# Patient Record
Sex: Male | Born: 1960 | Race: White | Hispanic: No | Marital: Single | Smoking: Never smoker
Health system: Southern US, Community
[De-identification: ages and names within clinical notes are randomized; demographics above are authoritative.]

## PROBLEM LIST (undated history)

## (undated) DIAGNOSIS — G809 Cerebral palsy, unspecified: Secondary | ICD-10-CM

## (undated) DIAGNOSIS — Z8719 Personal history of other diseases of the digestive system: Secondary | ICD-10-CM

## (undated) DIAGNOSIS — A419 Sepsis, unspecified organism: Secondary | ICD-10-CM

## (undated) DIAGNOSIS — N390 Urinary tract infection, site not specified: Secondary | ICD-10-CM

## (undated) DIAGNOSIS — M869 Osteomyelitis, unspecified: Secondary | ICD-10-CM

## (undated) DIAGNOSIS — K219 Gastro-esophageal reflux disease without esophagitis: Secondary | ICD-10-CM

## (undated) DIAGNOSIS — L219 Seborrheic dermatitis, unspecified: Secondary | ICD-10-CM

## (undated) DIAGNOSIS — Z8711 Personal history of peptic ulcer disease: Secondary | ICD-10-CM

## (undated) DIAGNOSIS — D649 Anemia, unspecified: Secondary | ICD-10-CM

## (undated) DIAGNOSIS — L97509 Non-pressure chronic ulcer of other part of unspecified foot with unspecified severity: Secondary | ICD-10-CM

## (undated) DIAGNOSIS — F419 Anxiety disorder, unspecified: Secondary | ICD-10-CM

## (undated) DIAGNOSIS — K254 Chronic or unspecified gastric ulcer with hemorrhage: Secondary | ICD-10-CM

## (undated) DIAGNOSIS — K270 Acute peptic ulcer, site unspecified, with hemorrhage: Secondary | ICD-10-CM

## (undated) DIAGNOSIS — F319 Bipolar disorder, unspecified: Secondary | ICD-10-CM

## (undated) DIAGNOSIS — K5730 Diverticulosis of large intestine without perforation or abscess without bleeding: Secondary | ICD-10-CM

## (undated) DIAGNOSIS — L89159 Pressure ulcer of sacral region, unspecified stage: Secondary | ICD-10-CM

## (undated) DIAGNOSIS — F429 Obsessive-compulsive disorder, unspecified: Secondary | ICD-10-CM

## (undated) DIAGNOSIS — F6381 Intermittent explosive disorder: Secondary | ICD-10-CM

## (undated) DIAGNOSIS — E11621 Type 2 diabetes mellitus with foot ulcer: Secondary | ICD-10-CM

## (undated) DIAGNOSIS — R6520 Severe sepsis without septic shock: Secondary | ICD-10-CM

## (undated) DIAGNOSIS — J189 Pneumonia, unspecified organism: Secondary | ICD-10-CM

## (undated) DIAGNOSIS — R531 Weakness: Secondary | ICD-10-CM

## (undated) DIAGNOSIS — Z9109 Other allergy status, other than to drugs and biological substances: Secondary | ICD-10-CM

## (undated) DIAGNOSIS — F79 Unspecified intellectual disabilities: Secondary | ICD-10-CM

## (undated) DIAGNOSIS — G8250 Quadriplegia, unspecified: Secondary | ICD-10-CM

## (undated) DIAGNOSIS — F29 Unspecified psychosis not due to a substance or known physiological condition: Secondary | ICD-10-CM

## (undated) DIAGNOSIS — K648 Other hemorrhoids: Secondary | ICD-10-CM

## (undated) DIAGNOSIS — Z8619 Personal history of other infectious and parasitic diseases: Secondary | ICD-10-CM

## (undated) DIAGNOSIS — F32A Depression, unspecified: Secondary | ICD-10-CM

## (undated) DIAGNOSIS — K5900 Constipation, unspecified: Secondary | ICD-10-CM

## (undated) DIAGNOSIS — E559 Vitamin D deficiency, unspecified: Secondary | ICD-10-CM

## (undated) DIAGNOSIS — J42 Unspecified chronic bronchitis: Secondary | ICD-10-CM

## (undated) DIAGNOSIS — F78 Other intellectual disabilities: Secondary | ICD-10-CM

## (undated) DIAGNOSIS — Z9289 Personal history of other medical treatment: Secondary | ICD-10-CM

## (undated) DIAGNOSIS — F329 Major depressive disorder, single episode, unspecified: Secondary | ICD-10-CM

## (undated) DIAGNOSIS — E7800 Pure hypercholesterolemia, unspecified: Secondary | ICD-10-CM

## (undated) DIAGNOSIS — R32 Unspecified urinary incontinence: Secondary | ICD-10-CM

## (undated) HISTORY — DX: Acute peptic ulcer, site unspecified, with hemorrhage: K27.0

## (undated) HISTORY — DX: Cerebral palsy, unspecified: G80.9

## (undated) HISTORY — PX: LEG SURGERY: SHX1003

## (undated) HISTORY — PX: OTHER SURGICAL HISTORY: SHX169

## (undated) HISTORY — PX: HERNIA REPAIR: SHX51

## (undated) HISTORY — DX: Unspecified urinary incontinence: R32

---
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
## 1980-05-27 HISTORY — PX: HIATAL HERNIA REPAIR: SHX195

## 1997-12-30 ENCOUNTER — Encounter: Admission: RE | Admit: 1997-12-30 | Discharge: 1997-12-30 | Payer: Self-pay | Admitting: Family Medicine

## 1998-03-28 ENCOUNTER — Encounter: Admission: RE | Admit: 1998-03-28 | Discharge: 1998-03-28 | Payer: Self-pay | Admitting: Family Medicine

## 1999-02-02 ENCOUNTER — Encounter: Admission: RE | Admit: 1999-02-02 | Discharge: 1999-02-02 | Payer: Self-pay | Admitting: Family Medicine

## 1999-05-01 ENCOUNTER — Encounter: Admission: RE | Admit: 1999-05-01 | Discharge: 1999-07-30 | Payer: Self-pay | Admitting: *Deleted

## 1999-06-21 ENCOUNTER — Encounter: Admission: RE | Admit: 1999-06-21 | Discharge: 1999-06-21 | Payer: Self-pay | Admitting: Family Medicine

## 2000-05-12 ENCOUNTER — Encounter: Admission: RE | Admit: 2000-05-12 | Discharge: 2000-05-12 | Payer: Self-pay | Admitting: Family Medicine

## 2000-05-15 ENCOUNTER — Encounter: Admission: RE | Admit: 2000-05-15 | Discharge: 2000-05-15 | Payer: Self-pay | Admitting: Family Medicine

## 2000-11-14 ENCOUNTER — Emergency Department (HOSPITAL_COMMUNITY): Admission: EM | Admit: 2000-11-14 | Discharge: 2000-11-14 | Payer: Self-pay | Admitting: Emergency Medicine

## 2001-06-25 ENCOUNTER — Encounter: Admission: RE | Admit: 2001-06-25 | Discharge: 2001-06-25 | Payer: Self-pay | Admitting: Sports Medicine

## 2001-07-28 ENCOUNTER — Encounter: Admission: RE | Admit: 2001-07-28 | Discharge: 2001-07-28 | Payer: Self-pay | Admitting: Sports Medicine

## 2001-12-16 ENCOUNTER — Encounter: Admission: RE | Admit: 2001-12-16 | Discharge: 2001-12-16 | Payer: Self-pay | Admitting: Family Medicine

## 2001-12-18 ENCOUNTER — Encounter: Admission: RE | Admit: 2001-12-18 | Discharge: 2001-12-18 | Payer: Self-pay | Admitting: Family Medicine

## 2002-01-12 ENCOUNTER — Encounter: Admission: RE | Admit: 2002-01-12 | Discharge: 2002-01-12 | Payer: Self-pay | Admitting: Family Medicine

## 2002-01-12 ENCOUNTER — Encounter: Admission: RE | Admit: 2002-01-12 | Discharge: 2002-01-12 | Payer: Self-pay | Admitting: Sports Medicine

## 2002-01-12 ENCOUNTER — Encounter: Payer: Self-pay | Admitting: Sports Medicine

## 2002-08-24 ENCOUNTER — Encounter: Admission: RE | Admit: 2002-08-24 | Discharge: 2002-08-24 | Payer: Self-pay | Admitting: Sports Medicine

## 2002-10-19 ENCOUNTER — Encounter: Admission: RE | Admit: 2002-10-19 | Discharge: 2002-10-19 | Payer: Self-pay | Admitting: Family Medicine

## 2002-10-27 ENCOUNTER — Encounter: Admission: RE | Admit: 2002-10-27 | Discharge: 2002-10-27 | Payer: Self-pay | Admitting: Family Medicine

## 2002-12-13 ENCOUNTER — Encounter: Admission: RE | Admit: 2002-12-13 | Discharge: 2002-12-13 | Payer: Self-pay | Admitting: Family Medicine

## 2003-11-10 ENCOUNTER — Encounter: Admission: RE | Admit: 2003-11-10 | Discharge: 2003-11-10 | Payer: Self-pay | Admitting: Family Medicine

## 2004-08-08 ENCOUNTER — Ambulatory Visit: Payer: Self-pay | Admitting: Family Medicine

## 2005-08-12 ENCOUNTER — Ambulatory Visit: Payer: Self-pay | Admitting: Sports Medicine

## 2006-06-27 ENCOUNTER — Ambulatory Visit: Payer: Self-pay | Admitting: Family Medicine

## 2006-07-24 DIAGNOSIS — K270 Acute peptic ulcer, site unspecified, with hemorrhage: Secondary | ICD-10-CM | POA: Insufficient documentation

## 2006-07-24 DIAGNOSIS — F79 Unspecified intellectual disabilities: Secondary | ICD-10-CM | POA: Insufficient documentation

## 2006-07-24 DIAGNOSIS — G809 Cerebral palsy, unspecified: Secondary | ICD-10-CM

## 2006-07-24 HISTORY — DX: Acute peptic ulcer, site unspecified, with hemorrhage: K27.0

## 2006-07-24 HISTORY — DX: Cerebral palsy, unspecified: G80.9

## 2006-08-08 ENCOUNTER — Encounter: Payer: Self-pay | Admitting: Family Medicine

## 2006-08-08 ENCOUNTER — Ambulatory Visit: Payer: Self-pay | Admitting: Family Medicine

## 2006-08-08 LAB — CONVERTED CEMR LAB
HCT: 40.2 %
Hemoglobin: 13.4 g/dL

## 2006-08-11 ENCOUNTER — Encounter: Payer: Self-pay | Admitting: Family Medicine

## 2006-08-11 LAB — CONVERTED CEMR LAB
ALT: 39 units/L (ref 0–53)
BUN: 15 mg/dL (ref 6–23)
CO2: 21 meq/L (ref 19–32)
Calcium: 9.4 mg/dL (ref 8.4–10.5)
Chloride: 106 meq/L (ref 96–112)
Creatinine, Ser: 0.68 mg/dL (ref 0.40–1.50)
Direct LDL: 140 mg/dL — ABNORMAL HIGH
Glucose, Bld: 119 mg/dL — ABNORMAL HIGH (ref 70–99)
Total Bilirubin: 0.3 mg/dL (ref 0.3–1.2)

## 2007-07-31 ENCOUNTER — Encounter (INDEPENDENT_AMBULATORY_CARE_PROVIDER_SITE_OTHER): Payer: Self-pay | Admitting: *Deleted

## 2007-07-31 ENCOUNTER — Ambulatory Visit: Payer: Self-pay | Admitting: Family Medicine

## 2007-07-31 DIAGNOSIS — L708 Other acne: Secondary | ICD-10-CM | POA: Insufficient documentation

## 2007-08-03 ENCOUNTER — Encounter: Admission: RE | Admit: 2007-08-03 | Discharge: 2007-08-03 | Payer: Self-pay | Admitting: *Deleted

## 2007-08-03 ENCOUNTER — Encounter (INDEPENDENT_AMBULATORY_CARE_PROVIDER_SITE_OTHER): Payer: Self-pay | Admitting: *Deleted

## 2007-08-03 LAB — CONVERTED CEMR LAB
ALT: 29 units/L (ref 0–53)
Basophils Absolute: 0 10*3/uL (ref 0.0–0.1)
CO2: 25 meq/L (ref 19–32)
Calcium: 10.2 mg/dL (ref 8.4–10.5)
Chloride: 105 meq/L (ref 96–112)
Cholesterol: 205 mg/dL — ABNORMAL HIGH (ref 0–200)
Creatinine, Ser: 0.66 mg/dL (ref 0.40–1.50)
Glucose, Bld: 93 mg/dL (ref 70–99)
Hemoglobin: 14 g/dL (ref 13.0–17.0)
Lymphocytes Relative: 33 % (ref 12–46)
Lymphs Abs: 2.3 10*3/uL (ref 0.7–4.0)
Monocytes Absolute: 0.4 10*3/uL (ref 0.1–1.0)
Neutro Abs: 3.9 10*3/uL (ref 1.7–7.7)
RDW: 13.8 % (ref 11.5–15.5)
Sodium: 141 meq/L (ref 135–145)
Total Protein: 7.4 g/dL (ref 6.0–8.3)
WBC: 7 10*3/uL (ref 4.0–10.5)

## 2007-08-04 ENCOUNTER — Encounter (INDEPENDENT_AMBULATORY_CARE_PROVIDER_SITE_OTHER): Payer: Self-pay | Admitting: *Deleted

## 2007-08-11 ENCOUNTER — Ambulatory Visit: Payer: Self-pay | Admitting: Family Medicine

## 2007-08-11 ENCOUNTER — Encounter (INDEPENDENT_AMBULATORY_CARE_PROVIDER_SITE_OTHER): Payer: Self-pay | Admitting: *Deleted

## 2007-08-19 ENCOUNTER — Ambulatory Visit: Payer: Self-pay | Admitting: Family Medicine

## 2007-08-19 ENCOUNTER — Encounter (INDEPENDENT_AMBULATORY_CARE_PROVIDER_SITE_OTHER): Payer: Self-pay | Admitting: *Deleted

## 2007-08-20 LAB — CONVERTED CEMR LAB
Albumin: 4.6 g/dL (ref 3.5–5.2)
BUN: 11 mg/dL (ref 6–23)
Calcium: 9.9 mg/dL (ref 8.4–10.5)
Chloride: 103 meq/L (ref 96–112)
Cholesterol: 206 mg/dL — ABNORMAL HIGH (ref 0–200)
Glucose, Bld: 96 mg/dL (ref 70–99)
HDL: 47 mg/dL (ref >39)
Potassium: 3.9 meq/L (ref 3.5–5.3)
Triglycerides: 190 mg/dL — ABNORMAL HIGH (ref <150)

## 2007-08-26 ENCOUNTER — Ambulatory Visit: Payer: Self-pay | Admitting: Family Medicine

## 2007-08-31 ENCOUNTER — Ambulatory Visit: Payer: Self-pay | Admitting: Family Medicine

## 2007-09-03 ENCOUNTER — Encounter
Admission: RE | Admit: 2007-09-03 | Discharge: 2007-12-02 | Payer: Self-pay | Admitting: Physical Medicine & Rehabilitation

## 2007-09-09 ENCOUNTER — Ambulatory Visit: Payer: Self-pay | Admitting: Family Medicine

## 2007-09-10 ENCOUNTER — Ambulatory Visit: Payer: Self-pay | Admitting: Physical Medicine & Rehabilitation

## 2007-09-22 ENCOUNTER — Telehealth (INDEPENDENT_AMBULATORY_CARE_PROVIDER_SITE_OTHER): Payer: Self-pay | Admitting: *Deleted

## 2007-09-25 ENCOUNTER — Ambulatory Visit: Payer: Self-pay | Admitting: Family Medicine

## 2007-10-02 ENCOUNTER — Ambulatory Visit: Payer: Self-pay | Admitting: Family Medicine

## 2007-10-05 ENCOUNTER — Encounter (HOSPITAL_BASED_OUTPATIENT_CLINIC_OR_DEPARTMENT_OTHER): Admission: RE | Admit: 2007-10-05 | Discharge: 2007-12-31 | Payer: Self-pay | Admitting: Internal Medicine

## 2007-10-13 ENCOUNTER — Ambulatory Visit: Payer: Self-pay | Admitting: Physical Medicine & Rehabilitation

## 2007-10-28 ENCOUNTER — Encounter (INDEPENDENT_AMBULATORY_CARE_PROVIDER_SITE_OTHER): Payer: Self-pay | Admitting: *Deleted

## 2007-10-29 ENCOUNTER — Encounter: Payer: Self-pay | Admitting: Family Medicine

## 2007-11-09 ENCOUNTER — Encounter
Admission: RE | Admit: 2007-11-09 | Discharge: 2008-01-05 | Payer: Self-pay | Admitting: Physical Medicine & Rehabilitation

## 2007-11-10 ENCOUNTER — Ambulatory Visit: Payer: Self-pay | Admitting: Physical Medicine & Rehabilitation

## 2008-01-01 ENCOUNTER — Encounter (HOSPITAL_BASED_OUTPATIENT_CLINIC_OR_DEPARTMENT_OTHER): Admission: RE | Admit: 2008-01-01 | Discharge: 2008-02-24 | Payer: Self-pay | Admitting: Internal Medicine

## 2008-01-05 ENCOUNTER — Ambulatory Visit: Payer: Self-pay | Admitting: Physical Medicine & Rehabilitation

## 2008-02-11 ENCOUNTER — Encounter: Payer: Self-pay | Admitting: Family Medicine

## 2008-02-26 ENCOUNTER — Encounter (HOSPITAL_BASED_OUTPATIENT_CLINIC_OR_DEPARTMENT_OTHER): Admission: RE | Admit: 2008-02-26 | Discharge: 2008-05-26 | Payer: Self-pay | Admitting: Internal Medicine

## 2008-03-02 ENCOUNTER — Encounter: Payer: Self-pay | Admitting: Family Medicine

## 2008-03-28 ENCOUNTER — Encounter
Admission: RE | Admit: 2008-03-28 | Discharge: 2008-04-25 | Payer: Self-pay | Admitting: Physical Medicine & Rehabilitation

## 2008-03-29 ENCOUNTER — Ambulatory Visit: Payer: Self-pay | Admitting: Physical Medicine & Rehabilitation

## 2008-04-18 ENCOUNTER — Ambulatory Visit: Payer: Self-pay | Admitting: Family Medicine

## 2008-04-20 ENCOUNTER — Encounter: Payer: Self-pay | Admitting: Family Medicine

## 2008-04-25 ENCOUNTER — Ambulatory Visit: Payer: Self-pay | Admitting: Physical Medicine & Rehabilitation

## 2008-05-03 ENCOUNTER — Telehealth: Payer: Self-pay | Admitting: Family Medicine

## 2008-05-31 ENCOUNTER — Encounter (HOSPITAL_BASED_OUTPATIENT_CLINIC_OR_DEPARTMENT_OTHER): Admission: RE | Admit: 2008-05-31 | Discharge: 2008-08-29 | Payer: Self-pay | Admitting: Internal Medicine

## 2008-06-06 ENCOUNTER — Telehealth: Payer: Self-pay | Admitting: Family Medicine

## 2008-06-09 ENCOUNTER — Ambulatory Visit: Payer: Self-pay | Admitting: Family Medicine

## 2008-06-09 DIAGNOSIS — L259 Unspecified contact dermatitis, unspecified cause: Secondary | ICD-10-CM

## 2008-07-17 ENCOUNTER — Emergency Department (HOSPITAL_COMMUNITY): Admission: EM | Admit: 2008-07-17 | Discharge: 2008-07-17 | Payer: Self-pay | Admitting: Emergency Medicine

## 2008-07-20 ENCOUNTER — Ambulatory Visit: Payer: Self-pay | Admitting: Family Medicine

## 2008-08-15 ENCOUNTER — Ambulatory Visit: Payer: Self-pay | Admitting: Family Medicine

## 2008-08-15 ENCOUNTER — Encounter: Payer: Self-pay | Admitting: Family Medicine

## 2008-09-08 ENCOUNTER — Encounter (HOSPITAL_BASED_OUTPATIENT_CLINIC_OR_DEPARTMENT_OTHER): Admission: RE | Admit: 2008-09-08 | Discharge: 2008-10-28 | Payer: Self-pay | Admitting: Internal Medicine

## 2008-09-26 ENCOUNTER — Encounter: Payer: Self-pay | Admitting: *Deleted

## 2008-11-02 ENCOUNTER — Ambulatory Visit: Payer: Self-pay | Admitting: Family Medicine

## 2008-11-23 ENCOUNTER — Telehealth: Payer: Self-pay | Admitting: Family Medicine

## 2008-11-24 ENCOUNTER — Ambulatory Visit: Payer: Self-pay | Admitting: Physical Medicine & Rehabilitation

## 2008-11-24 ENCOUNTER — Encounter
Admission: RE | Admit: 2008-11-24 | Discharge: 2008-11-24 | Payer: Self-pay | Admitting: Physical Medicine & Rehabilitation

## 2008-11-24 ENCOUNTER — Ambulatory Visit: Payer: Self-pay | Admitting: Family Medicine

## 2008-11-25 ENCOUNTER — Encounter (INDEPENDENT_AMBULATORY_CARE_PROVIDER_SITE_OTHER): Payer: Self-pay | Admitting: Family Medicine

## 2008-11-30 ENCOUNTER — Ambulatory Visit: Payer: Self-pay | Admitting: Family Medicine

## 2008-11-30 ENCOUNTER — Telehealth: Payer: Self-pay | Admitting: Family Medicine

## 2008-12-19 ENCOUNTER — Encounter (HOSPITAL_BASED_OUTPATIENT_CLINIC_OR_DEPARTMENT_OTHER): Admission: RE | Admit: 2008-12-19 | Discharge: 2009-03-19 | Payer: Self-pay | Admitting: General Surgery

## 2008-12-20 ENCOUNTER — Encounter: Payer: Self-pay | Admitting: Family Medicine

## 2009-02-21 ENCOUNTER — Ambulatory Visit: Payer: Self-pay | Admitting: Physical Medicine & Rehabilitation

## 2009-02-21 ENCOUNTER — Encounter
Admission: RE | Admit: 2009-02-21 | Discharge: 2009-05-18 | Payer: Self-pay | Admitting: Physical Medicine & Rehabilitation

## 2009-03-14 ENCOUNTER — Ambulatory Visit: Payer: Self-pay | Admitting: Family Medicine

## 2009-03-30 ENCOUNTER — Ambulatory Visit: Payer: Self-pay | Admitting: Family Medicine

## 2009-03-30 ENCOUNTER — Encounter: Payer: Self-pay | Admitting: Family Medicine

## 2009-03-30 LAB — CONVERTED CEMR LAB
CO2: 25 meq/L (ref 19–32)
Creatinine, Ser: 0.58 mg/dL (ref 0.40–1.50)
Glucose, Bld: 161 mg/dL — ABNORMAL HIGH (ref 70–99)
Total Bilirubin: 0.3 mg/dL (ref 0.3–1.2)
Total Protein: 6.3 g/dL (ref 6.0–8.3)

## 2009-04-11 ENCOUNTER — Ambulatory Visit: Payer: Self-pay | Admitting: Physical Medicine & Rehabilitation

## 2009-05-03 ENCOUNTER — Encounter (HOSPITAL_BASED_OUTPATIENT_CLINIC_OR_DEPARTMENT_OTHER): Admission: RE | Admit: 2009-05-03 | Discharge: 2009-05-29 | Payer: Self-pay | Admitting: Internal Medicine

## 2009-05-04 ENCOUNTER — Encounter: Payer: Self-pay | Admitting: Family Medicine

## 2009-05-31 ENCOUNTER — Encounter (HOSPITAL_BASED_OUTPATIENT_CLINIC_OR_DEPARTMENT_OTHER): Admission: RE | Admit: 2009-05-31 | Discharge: 2009-08-29 | Payer: Self-pay | Admitting: Internal Medicine

## 2009-06-02 ENCOUNTER — Encounter
Admission: RE | Admit: 2009-06-02 | Discharge: 2009-06-26 | Payer: Self-pay | Admitting: Physical Medicine & Rehabilitation

## 2009-06-26 ENCOUNTER — Ambulatory Visit: Payer: Self-pay | Admitting: Physical Medicine & Rehabilitation

## 2009-07-05 ENCOUNTER — Ambulatory Visit: Payer: Self-pay | Admitting: Family Medicine

## 2009-07-05 DIAGNOSIS — Z593 Problems related to living in residential institution: Secondary | ICD-10-CM

## 2009-07-06 ENCOUNTER — Telehealth: Payer: Self-pay | Admitting: *Deleted

## 2009-07-07 ENCOUNTER — Encounter: Payer: Self-pay | Admitting: *Deleted

## 2009-07-07 ENCOUNTER — Encounter: Payer: Self-pay | Admitting: Family Medicine

## 2009-07-07 ENCOUNTER — Ambulatory Visit: Payer: Self-pay | Admitting: Family Medicine

## 2009-07-07 LAB — CONVERTED CEMR LAB
Albumin: 4.2 g/dL (ref 3.5–5.2)
CO2: 24 meq/L (ref 19–32)
Calcium: 9.5 mg/dL (ref 8.4–10.5)
Cholesterol: 161 mg/dL (ref 0–200)
Glucose, Bld: 96 mg/dL (ref 70–99)
HCT: 41.6 % (ref 39.0–52.0)
MCV: 86.3 fL (ref 78.0–100.0)
RBC: 4.82 M/uL (ref 4.22–5.81)
Sodium: 142 meq/L (ref 135–145)
Total Bilirubin: 0.3 mg/dL (ref 0.3–1.2)
Total Protein: 6.8 g/dL (ref 6.0–8.3)
Triglycerides: 94 mg/dL (ref <150)
VLDL: 19 mg/dL (ref 0–40)
WBC: 4.6 10*3/uL (ref 4.0–10.5)

## 2009-08-29 ENCOUNTER — Encounter
Admission: RE | Admit: 2009-08-29 | Discharge: 2009-11-27 | Payer: Self-pay | Admitting: Physical Medicine & Rehabilitation

## 2009-08-30 ENCOUNTER — Encounter (HOSPITAL_BASED_OUTPATIENT_CLINIC_OR_DEPARTMENT_OTHER): Admission: RE | Admit: 2009-08-30 | Discharge: 2009-11-28 | Payer: Self-pay | Admitting: Internal Medicine

## 2009-09-04 ENCOUNTER — Ambulatory Visit: Payer: Self-pay | Admitting: Physical Medicine & Rehabilitation

## 2009-09-26 ENCOUNTER — Ambulatory Visit: Payer: Self-pay | Admitting: Physical Medicine & Rehabilitation

## 2009-10-02 ENCOUNTER — Ambulatory Visit: Payer: Self-pay | Admitting: Family Medicine

## 2009-11-10 ENCOUNTER — Ambulatory Visit (HOSPITAL_COMMUNITY): Admission: RE | Admit: 2009-11-10 | Discharge: 2009-11-10 | Payer: Self-pay | Admitting: Internal Medicine

## 2009-11-21 ENCOUNTER — Ambulatory Visit: Payer: Self-pay | Admitting: Physical Medicine & Rehabilitation

## 2009-11-30 ENCOUNTER — Encounter (HOSPITAL_BASED_OUTPATIENT_CLINIC_OR_DEPARTMENT_OTHER): Admission: RE | Admit: 2009-11-30 | Discharge: 2010-02-28 | Payer: Self-pay | Admitting: Internal Medicine

## 2009-12-14 ENCOUNTER — Encounter: Payer: Self-pay | Admitting: *Deleted

## 2009-12-25 ENCOUNTER — Encounter
Admission: RE | Admit: 2009-12-25 | Discharge: 2010-03-25 | Payer: Self-pay | Admitting: Physical Medicine & Rehabilitation

## 2009-12-25 ENCOUNTER — Encounter: Payer: Self-pay | Admitting: Family Medicine

## 2009-12-25 ENCOUNTER — Ambulatory Visit: Payer: Self-pay | Admitting: Family Medicine

## 2009-12-28 ENCOUNTER — Ambulatory Visit: Payer: Self-pay | Admitting: Physical Medicine & Rehabilitation

## 2010-01-30 ENCOUNTER — Ambulatory Visit: Payer: Self-pay | Admitting: Physical Medicine & Rehabilitation

## 2010-02-09 ENCOUNTER — Ambulatory Visit: Payer: Self-pay | Admitting: Family Medicine

## 2010-02-09 DIAGNOSIS — R32 Unspecified urinary incontinence: Secondary | ICD-10-CM

## 2010-02-09 DIAGNOSIS — R300 Dysuria: Secondary | ICD-10-CM | POA: Insufficient documentation

## 2010-02-09 HISTORY — DX: Unspecified urinary incontinence: R32

## 2010-02-09 LAB — CONVERTED CEMR LAB
Bilirubin Urine: NEGATIVE
Blood in Urine, dipstick: NEGATIVE
Glucose, Urine, Semiquant: 250
Hgb A1c MFr Bld: 5.4 %
Nitrite: NEGATIVE
Protein, U semiquant: NEGATIVE
Specific Gravity, Urine: 1.025
Urobilinogen, UA: 1
WBC Urine, dipstick: NEGATIVE
pH: 7

## 2010-02-16 ENCOUNTER — Ambulatory Visit: Payer: Self-pay | Admitting: Family Medicine

## 2010-02-26 DIAGNOSIS — E748 Other specified disorders of carbohydrate metabolism: Secondary | ICD-10-CM

## 2010-03-07 ENCOUNTER — Encounter (HOSPITAL_BASED_OUTPATIENT_CLINIC_OR_DEPARTMENT_OTHER)
Admission: RE | Admit: 2010-03-07 | Discharge: 2010-06-05 | Payer: Self-pay | Source: Home / Self Care | Attending: Internal Medicine | Admitting: Internal Medicine

## 2010-03-20 ENCOUNTER — Encounter
Admission: RE | Admit: 2010-03-20 | Discharge: 2010-03-27 | Payer: Self-pay | Source: Home / Self Care | Attending: Physical Medicine & Rehabilitation | Admitting: Physical Medicine & Rehabilitation

## 2010-03-27 ENCOUNTER — Ambulatory Visit: Payer: Self-pay | Admitting: Physical Medicine & Rehabilitation

## 2010-06-06 ENCOUNTER — Encounter (HOSPITAL_BASED_OUTPATIENT_CLINIC_OR_DEPARTMENT_OTHER)
Admission: RE | Admit: 2010-06-06 | Discharge: 2010-06-26 | Payer: Self-pay | Source: Home / Self Care | Attending: Internal Medicine | Admitting: Internal Medicine

## 2010-06-14 ENCOUNTER — Ambulatory Visit
Admission: RE | Admit: 2010-06-14 | Discharge: 2010-06-14 | Payer: Self-pay | Source: Home / Self Care | Attending: Family Medicine | Admitting: Family Medicine

## 2010-06-14 LAB — CONVERTED CEMR LAB
Bilirubin Urine: NEGATIVE
Blood in Urine, dipstick: NEGATIVE
Glucose, Urine, Semiquant: NEGATIVE
Ketones, urine, test strip: NEGATIVE
Nitrite: NEGATIVE
Protein, U semiquant: NEGATIVE
Specific Gravity, Urine: 1.015
Urobilinogen, UA: 0.2
WBC Urine, dipstick: NEGATIVE
pH: 7

## 2010-06-19 ENCOUNTER — Ambulatory Visit
Admission: RE | Admit: 2010-06-19 | Discharge: 2010-06-19 | Payer: Self-pay | Source: Home / Self Care | Attending: Vascular Surgery | Admitting: Vascular Surgery

## 2010-06-22 ENCOUNTER — Encounter
Admission: RE | Admit: 2010-06-22 | Discharge: 2010-06-26 | Payer: Self-pay | Source: Home / Self Care | Attending: Physical Medicine & Rehabilitation | Admitting: Physical Medicine & Rehabilitation

## 2010-06-26 ENCOUNTER — Ambulatory Visit
Admission: RE | Admit: 2010-06-26 | Discharge: 2010-06-26 | Payer: Self-pay | Source: Home / Self Care | Attending: Physical Medicine & Rehabilitation | Admitting: Physical Medicine & Rehabilitation

## 2010-06-26 NOTE — Progress Notes (Signed)
Summary: re: hydrocortisone cream/ts  Phone Note From Other Clinic Call back at Home Phone [phone]   Caller: [facility] Summary of Call: has a question about his hydrocortisone cream - want to change times of meds from 6pm to 9pm wants to know if there would be any interferrance with that if he takes his other meds and also the cream. Initial call taken by: De Nurse,  July 06, 2009 9:22 AM  Follow-up for Phone Call        no problem. Follow-up by: Eustaquio Boyden  MD,  July 06, 2009 9:43 AM  Additional Follow-up for Phone Call Additional follow up Details #1::        called and informed. Additional Follow-up by: Arlyss Repress CMA,,  July 06, 2009 10:38 AM

## 2010-06-26 NOTE — Assessment & Plan Note (Signed)
Summary: bowel prob,df   Vital Signs:  Patient profile:   50 year old male Temp:     98.4 degrees F oral Pulse rate:   84 / minute Pulse rhythm:   regular BP sitting:   111 / 75  (right arm) Cuff size:   regular  Vitals Entered By: Loralee Pacas CMA (February 09, 2010 2:20 PM)   Primary Care Provider:  . [facility]   History of Present Illness: 50 yo M with CP:  1. Bladder Incontinence: x several weeks, bladder incontinence 2-3 times per week, patient states that he "doesn't make it" and "feels frustrated" when he urinates on himself. patient denies (and aid confirms) fever/chills, N/V, rash, edema, back pain. he has no Hx of this issue. he does not take diuretics, or baldder medications. he has also had 2 episodes of diarrhea/incontinence, one during an acute viral illness. the patient does endorse intermittent suprapubic pain before urination and intermittent dysuria. he does try to "hold it." no increase in caffeine or ginger. 30 minutes spent on history and work-up.  Habits & Providers  Alcohol-Tobacco-Diet     Tobacco Status: never     Tobacco Counseling: not indicated; no tobacco use  Current Medications (verified): 1)  Multivitamins   Tabs (Multiple Vitamin) .... One Tab Po Daily 2)  Prevacid Solutab 30 Mg  Tbdp (Lansoprazole) .... One Tab Po Daily 3)  Hydrocortisone Acetate 1 %  Crea (Hydrocortisone Acetate) .... Apply Daily To Face 4)  Triamcinolone Acetonide 0.1 % Crea (Triamcinolone Acetonide) .... Apply To Dry, Itchy Areas of Skin On Body (Not Face, Axillae, or Groin) Up To 2 Times Daily As Needed.  When Not Itchy, Do Not Apply. Dispense 1 Pound Jar 5)  Retin-A 0.025 %  Crea (Tretinoin) .... Apply At Bedtime To Face 6)  Lorazepam 1 Mg Tabs (Lorazepam) .... One By Mouth Q 4 Hours As Needed 7)  Ketoconazole 2 % Crea (Ketoconazole) .... Apply To Affected Area Daily, 30 Gm 8)  Risperidone 1 Mg Tabs (Risperidone) .... One By Mouth At Bedtime 9)  Depakote 500 Mg Tbec  (Divalproex Sodium) .... Er 500mg  Po 2 Tablets At Bedtime Per [facility] 10)  Baclofen 10 Mg Tabs (Baclofen) .... One By Mouth Two Times A Day 11)  Doxycycline Hyclate 100 Mg Caps (Doxycycline Hyclate) .... One By Mouth Daily  Allergies (verified): 1)  ! Adhesive Tape PMH-FH-SH reviewed for relevance  Review of Systems      See HPI  Physical Exam  General:  In wheelchair, underweight appearing.  In NAD. Vitals reviewed. Abdomen:  Soft, non-tender, and normal bowel sounds.   Genitalia:  No penis lesions or urethral discharge.   Impression & Recommendations:  Problem # 1:  URINARY INCONTINENCE (ICD-788.30) Assessment New  Unclear etiology. Ruled out UTI, overflow incontinence, and DM. No red flags. Rx Detrol LA at low dose. Anticholinergic RED FLAGs given. Follow up in one week with PCP.  Orders: [facility]- Est  Level 4 (27253)  Problem # 2:  GLYCOSURIA (ICD-791.5) Assessment: New With Hx of fasting hyperglycemia. A1c normal at 5.4. Orders: A1C-[facility] (66440) [facility]- Est  Level 4 (34742)  Complete Medication List: 1)  Multivitamins Tabs (Multiple vitamin) .... One tab po daily 2)  Prevacid Solutab 30 Mg Tbdp (Lansoprazole) .... One tab po daily 3)  Hydrocortisone Acetate 1 % Crea (Hydrocortisone acetate) .... Apply daily to face 4)  Triamcinolone Acetonide 0.1 % Crea (Triamcinolone acetonide) .... Apply to dry, itchy areas of skin on body (not  face, axillae, or groin) up to 2 times daily as needed.  when not itchy, do not apply. dispense 1 pound jar 5)  Retin-a 0.025 % Crea (Tretinoin) .... Apply at bedtime to face 6)  Lorazepam 1 Mg Tabs (Lorazepam) .... One by mouth q 4 hours as needed 7)  Ketoconazole 2 % Crea (Ketoconazole) .... Apply to affected area daily, 30 gm 8)  Risperidone 1 Mg Tabs (Risperidone) .... One by mouth at bedtime 9)  Depakote 500 Mg Tbec (Divalproex sodium) .... Er 500mg  po 2 tablets at bedtime per [facility] 10)  Baclofen 10 Mg Tabs (Baclofen) .... One  by mouth two times a day 11)  Doxycycline Hyclate 100 Mg Caps (Doxycycline hyclate) .... One by mouth daily 12)  Detrol La 2 Mg Cp24 (Tolterodine tartrate) .Marland Kitchen.. 1 tablet by mouth daily  Other Orders: Urinalysis-[facility] (00000)  Patient Instructions: 1)  It was nice to meet you today! 2)  Follow-up in one week. Prescriptions: DETROL LA 2 MG  CP24 (TOLTERODINE TARTRATE) 1 tablet by mouth daily  #90 x 3   Entered and Authorized by:   Helane Rima DO   Signed by:   Helane Rima DO on 02/09/2010   Method used:   Print then Give to Patient   RxID:   [id number] DETROL LA 2 MG  CP24 (TOLTERODINE TARTRATE) 1 tablet by mouth daily  #90 x 3   Entered and Authorized by:   Helane Rima DO   Signed by:   Helane Rima DO on 02/09/2010   Method used:   Print then Give to Patient   RxID:   [id number]   Laboratory Results   Urine Tests  Date/Time Received: February 09, 2010 3:17 PM  Date/Time Reported: February 09, 2010 3:20 PM   Routine Urinalysis   Color: yellow Appearance: Clear Glucose: 250   (Normal Range: Negative) Bilirubin: negative   (Normal Range: Negative) Ketone: small (15)   (Normal Range: Negative) Spec. Gravity: 1.025   (Normal Range: 1.003-1.035) Blood: negative   (Normal Range: Negative) pH: 7.0   (Normal Range: 5.0-8.0) Protein: negative   (Normal Range: Negative) Urobilinogen: 1.0   (Normal Range: 0-1) Nitrite: negative   (Normal Range: Negative) Leukocyte Esterace: negative   (Normal Range: Negative)    Comments: ...............test performed by......Marland KitchenBonnie A. [place], MLS (ASCP)cm   Blood Tests   Date/Time Received: February 09, 2010 3:26 PM  Date/Time Reported: February 09, 2010 3:37 PM   HGBA1C: 5.4%   (Normal Range: Non-Diabetic - 3-6%   Control Diabetic - 6-8%)  Comments: ...............test performed by......Marland KitchenBonnie A. [place], MLS (ASCP)cm     Appended Document: bowel prob,df Post-void residual - 2 to 3 drops of  urine.  Appended Document: Orders Update    Clinical Lists Changes  Problems: Added new problem of RENAL GLYCOSURIA (ICD-271.4) Orders: Added new Test order of A1C-[facility] [phone]) - Signed

## 2010-06-26 NOTE — Assessment & Plan Note (Signed)
Summary: F/U urinary incontinence   Vital Signs:  Patient profile:   50 year old male Temp:     97.9 degrees F oral Pulse rate:   83 / minute Pulse rhythm:   regular BP sitting:   116 / 72  (left arm) Cuff size:   regular  Vitals Entered By: Loralee Pacas CMA (February 16, 2010 3:53 PM)   Primary Provider:  . [facility]   History of Present Illness: Pt here for f/u on urinary incontinence.  Quantrell states that his incontinence has improved since starting detrol last week.  He did had one accident today but that was because someone else was in the bathroom and he had to wait too long.  Has not had any side effects such as dry mouth, change in personality, too much urine retention.  Denies any pain with urination, fevers or chills, denies bowel incontinence.  UA done last week was negative.  Also requests flu vaccine today.  Habits & Providers  Alcohol-Tobacco-Diet     Tobacco Status: never     Tobacco Counseling: not indicated; no tobacco use  Current Medications (verified): 1)  Multivitamins   Tabs (Multiple Vitamin) .... One Tab Po Daily 2)  Prevacid Solutab 30 Mg  Tbdp (Lansoprazole) .... One Tab Po Daily 3)  Hydrocortisone Acetate 1 %  Crea (Hydrocortisone Acetate) .... Apply Daily To Face 4)  Triamcinolone Acetonide 0.1 % Crea (Triamcinolone Acetonide) .... Apply To Dry, Itchy Areas of Skin On Body (Not Face, Axillae, or Groin) Up To 2 Times Daily As Needed.  When Not Itchy, Do Not Apply. Dispense 1 Pound Jar 5)  Retin-A 0.025 %  Crea (Tretinoin) .... Apply At Bedtime To Face 6)  Lorazepam 1 Mg Tabs (Lorazepam) .... One By Mouth Q 4 Hours As Needed 7)  Ketoconazole 2 % Crea (Ketoconazole) .... Apply To Affected Area Daily, 30 Gm 8)  Risperidone 1 Mg Tabs (Risperidone) .... One By Mouth At Bedtime 9)  Depakote 500 Mg Tbec (Divalproex Sodium) .... Er 500mg  Po 2 Tablets At Bedtime Per [facility] 10)  Baclofen 10 Mg Tabs (Baclofen) .... One By Mouth Two Times A Day 11)   Doxycycline Hyclate 100 Mg Caps (Doxycycline Hyclate) .... One By Mouth Daily 12)  Detrol La 2 Mg  Cp24 (Tolterodine Tartrate) .Marland Kitchen.. 1 Tablet By Mouth Daily  Allergies (verified): 1)  ! Adhesive Tape  Review of Systems       Pertinent positives and negatives noted in HPI, Vitals signs noted   Physical Exam  General:  well-nourished, in wheel chair, nad Mouth:  MMM, no erythema Abdomen:  Bowel sounds positive,abdomen soft and non-tender without masses, organomegaly or hernias noted.  No suprapubic tenderness   Impression & Recommendations:  Problem # 1:  URINARY INCONTINENCE (ICD-788.30)  Improved since starting detrol last week, will continue.  Reviewed red flags associated with medication.  Told him if problem starts to get worse again to call to be seen again.  Went ahead and gave flu vaccine today as well since he does not come frequently, lives in a group home and has difficulty getting transportation arranged.  Will continue to see him on a yearly basis or more frequently as needed.  Orders: [facility]- Est Level  3 (16109)  Complete Medication List: 1)  Multivitamins Tabs (Multiple vitamin) .... One tab po daily 2)  Prevacid Solutab 30 Mg Tbdp (Lansoprazole) .... One tab po daily 3)  Hydrocortisone Acetate 1 % Crea (Hydrocortisone acetate) .... Apply daily to  face 4)  Triamcinolone Acetonide 0.1 % Crea (Triamcinolone acetonide) .... Apply to dry, itchy areas of skin on body (not face, axillae, or groin) up to 2 times daily as needed.  when not itchy, do not apply. dispense 1 pound jar 5)  Retin-a 0.025 % Crea (Tretinoin) .... Apply at bedtime to face 6)  Lorazepam 1 Mg Tabs (Lorazepam) .... One by mouth q 4 hours as needed 7)  Ketoconazole 2 % Crea (Ketoconazole) .... Apply to affected area daily, 30 gm 8)  Risperidone 1 Mg Tabs (Risperidone) .... One by mouth at bedtime 9)  Depakote 500 Mg Tbec (Divalproex sodium) .... Er 500mg  po 2 tablets at bedtime per [facility] 10)   Baclofen 10 Mg Tabs (Baclofen) .... One by mouth two times a day 11)  Doxycycline Hyclate 100 Mg Caps (Doxycycline hyclate) .... One by mouth daily 12)  Detrol La 2 Mg Cp24 (Tolterodine tartrate) .Marland Kitchen.. 1 tablet by mouth daily  Other Orders: Flu Vaccine 22yrs + [phone]) Admin 1st Vaccine (98119) Admin 1st Vaccine [facility]) [phone])  Patient Instructions: 1)  Good seeing you again Aurther Loft, I'm glad the Detrol is working for you.  2)  Remember the red flags we went over for the medication including dry mouth, feeling funny, change in behavior, feeling flushed, not being able to urinate at all.  I you have fever or this incontinence returns please call the family practice center.     Orders Added: 1)  Flu Vaccine 80yrs + [90658] 2)  Admin 1st Vaccine [90471] 3)  Admin 1st Vaccine [facility]) [90471S] 4)  [facility]- Est Level  3 [56213]    Influenza Vaccine    Vaccine Type: Fluvax 3+    Site: left deltoid    Mfr: GlaxoSmithKline    Dose: 0.5 ml    Route: IM    Given by: Garen Grams LPN    Exp. Date: 11/21/2010    Lot #: [id number]    VIS given: 12/19/09 version given February 16, 2010.  Flu Vaccine Consent Questions    Do you have a history of severe allergic reactions to this vaccine? no    Any prior history of allergic reactions to egg and/or gelatin? no    Do you have a sensitivity to the preservative Thimersol? no    Do you have a past history of Guillan-Barre Syndrome? no    Do you currently have an acute febrile illness? no    Have you ever had a severe reaction to latex? no    Vaccine information given and explained to patient? yes

## 2010-06-26 NOTE — Assessment & Plan Note (Signed)
Summary: Med Check/Tetanus    Vital Signs:  Patient profile:   50 year old male Temp:     98.5 degrees F oral Pulse rate:   96 / minute BP sitting:   125 / 88  (right arm) Cuff size:   regular  Vitals Entered By: Tessie Fass CMA (December 25, 2009 1:47 PM) CC: F/U Is Patient Diabetic? No Pain Assessment Patient in pain? no        Primary Provider:  . [facility]  CC:  F/U.  History of Present Illness: Pt. here following up on medications, however caretaker that is with him was given wrong med rec. sheet.  Does not remember all the medications that he takes. States he has been having no problems and tolerates his current medication regimen well.  Denies chest pain, nausea, vomiting, constipation, diarrhea, fever, or chills.  Allergies (verified): 1)  ! Adhesive Tape  Review of Systems       Pertinent positives and negatives noted in HPI, Vitals signs noted   Physical Exam  General:  In wheelchair, underweight appearing.  In NAD Lungs:  Normal respiratory effort, chest expands symmetrically. Lungs are clear to auscultation, no crackles or wheezes. Heart:  Normal rate and regular rhythm. S1 and S2 normal without gallop, murmur, click, rub or other extra sounds. Abdomen:  Bowel sounds positive,abdomen soft and non-tender without masses, organomegaly or hernias noted. Skin:  Warm and dry.no rashes and no edema.     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 doing well on current medication regimen, will have them send over his med rec paper so that I can verify what he is on Faxed back other paper work for refills on medications, will place in bin to be scanned in.   Orders: [facility]- Est Level  3 (84696)  Problem # 2:  Preventive Health Care (ICD-V70.0)  Given tetanus booster  Orders: [facility]- Est Level  3 (29528)  Complete Medication List: 1)  Multivitamins Tabs (Multiple vitamin) .... One tab po daily 2)   Prevacid Solutab 30 Mg Tbdp (Lansoprazole) .... One tab po daily 3)  Hydrocortisone Acetate 1 % Crea (Hydrocortisone acetate) .... Apply daily to face 4)  Triamcinolone Acetonide 0.1 % Crea (Triamcinolone acetonide) .... Apply to dry, itchy areas of skin on body (not face, axillae, or groin) up to 2 times daily as needed.  when not itchy, do not apply. dispense 1 pound jar 5)  Retin-a 0.025 % Crea (Tretinoin) .... Apply at bedtime to face 6)  Lorazepam 1 Mg Tabs (Lorazepam) .... One by mouth q 4 hours as needed 7)  Ketoconazole 2 % Crea (Ketoconazole) .... Apply to affected area daily, 30 gm 8)  Risperidone 1 Mg Tabs (Risperidone) .... One by mouth at bedtime 9)  Depakote 500 Mg Tbec (Divalproex sodium) .... Er 500mg  po 2 tablets at bedtime per [facility] 10)  Baclofen 10 Mg Tabs (Baclofen) .... One by mouth two times a day 11)  Doxycycline Hyclate 100 Mg Caps (Doxycycline hyclate) .... One by mouth daily  Other Orders: Tdap => 51yrs IM (41324) Admin 1st Vaccine (40102) Admin 1st Vaccine [facility]) [phone])  Patient Instructions: 1)  It was nice meeting you today. 2)  Please have the people at [facility] fax over your medication reconciliation form, so that we can verify your correct medications. 3)  I will fax the form back for refills on your other medications 4)  If you  have any concerns please feel free to call the clinic.   Prevention & Chronic Care Immunizations   Influenza vaccine: Fluvax MCR  (03/14/2009)   Influenza vaccine due: 01/25/2010    Tetanus booster: 12/25/2009: Tdap   Tetanus booster due: 05/27/2009    Pneumococcal vaccine: Done.  (07/25/2001)   Pneumococcal vaccine due: None  Other Screening   Smoking status: never  (10/02/2009)  Lipids   Total Cholesterol: 161  (07/07/2009)   LDL: 94  (07/07/2009)   LDL Direct: 140  (08/08/2006)   HDL: 48  (07/07/2009)   Triglycerides: 94  (07/07/2009)   Lipid panel due: 06/27/2010   Nursing Instructions: Give  tetanus booster today     Prevention & Chronic Care Immunizations   Influenza vaccine: Fluvax MCR  (03/14/2009)   Influenza vaccine due: 01/25/2010    Tetanus booster: 12/25/2009: Tdap   Tetanus booster due: 05/27/2009    Pneumococcal vaccine: Done.  (07/25/2001)   Pneumococcal vaccine due: None  Other Screening   Smoking status: never  (10/02/2009)  Lipids   Total Cholesterol: 161  (07/07/2009)   LDL: 94  (07/07/2009)   LDL Direct: 140  (08/08/2006)   HDL: 48  (07/07/2009)   Triglycerides: 94  (07/07/2009)   Lipid panel due: 06/27/2010   Tetanus/Td Vaccine    Vaccine Type: Tdap    Site: left deltoid    Mfr: BOOSTRIX    Dose: 0.5 ml    Route: IM    Given by: Arlyss Repress CMA,    Exp. Date: 11/24/2011    Lot #: [id number]    VIS given: 04/14/07 version given December 25, 2009.

## 2010-06-26 NOTE — Assessment & Plan Note (Signed)
Summary: Read PPD  Nurse Visit   Allergies: 1)  ! Adhesive Tape  PPD Results    Date of reading: 07/07/2009    Results: 0 mm    Interpretation: negative  Orders Added: 1)  No Charge Patient Arrived (NCPA0) [NCPA0] there is no  induration , no apparent indication  of where PPD was applied on right arm. It has been 43 hours since PPD applied. consulted  Dr. Mauricio Po and advised may state as negative at this time since transportation is a problem for patient to return in 5 hours. Theresia Lo RN  July 07, 2009 10:12 AM

## 2010-06-26 NOTE — Miscellaneous (Signed)
Summary: needs appt  Clinical Lists Changes rec'd faxed orders & refills from [employer] [facility]. per Dr. Deirdre Priest, he needs to come in & see md for this to be done. I called [facility] to schedule. the staff person who answered said she would have her supervisor call back to schedule.  forms are in triage office.Golden Circle RN  December 14, 2009 11:07 AM  Lendon Colonel is the supervisor. spoke with a staff person who said she would give Ms. Ford the message.Golden Circle RN  December 18, 2009 9:12 AM  states they already have called back & made an appt.Golden Circle RN  December 18, 2009 10:46 AM

## 2010-06-26 NOTE — Assessment & Plan Note (Signed)
Summary: cpe,tcb   Vital Signs:  Patient profile:   50 year old male Temp:     97.5 degrees F Pulse rate:   91 / minute BP sitting:   137 / 57  Vitals Entered By: Jone Baseman CMA (July 05, 2009 1:56 PM) CC: cpe   Primary Care Provider:  Ardeen Garland  MD  CC:  cpe.  History of Present Illness: here for CPE, lives in group home.  Had "rough transfer" last week from wheelchair and right knee has been since hurting, but resolved with tylenol and ice.  Current Medications (verified): 1)  Multivitamins   Tabs (Multiple Vitamin) .... One Tab Po Daily 2)  Prevacid Solutab 30 Mg  Tbdp (Lansoprazole) .... One Tab Po Daily 3)  Hydrocortisone Acetate 1 %  Crea (Hydrocortisone Acetate) .... Apply Daily To Face 4)  Triamcinolone Acetonide 0.1 % Crea (Triamcinolone Acetonide) .... Apply To Dry, Itchy Areas of Skin On Body (Not Face, Axillae, or Groin) Up To 2 Times Daily As Needed.  When Not Itchy, Do Not Apply. Dispense 1 Pound Jar 5)  Retin-A 0.025 %  Crea (Tretinoin) .... Apply At Bedtime To Face 6)  Lorazepam 1 Mg Tabs (Lorazepam) .... One By Mouth Q 4 Hours As Needed 7)  Ketoconazole 2 % Crea (Ketoconazole) .... Apply To Affected Area Daily, 30 Gm 8)  Risperidone 1 Mg Tabs (Risperidone) .... One By Mouth At Bedtime 9)  Depakote 500 Mg Tbec (Divalproex Sodium) .... Er 500mg  Po 2 Tablets At Bedtime Per [facility] 10)  Lamisil 250 Mg Tabs (Terbinafine Hcl) .Marland Kitchen.. 1 By Mouth Q Weekly X 12 Weeks 11)  Baclofen 10 Mg Tabs (Baclofen) .... One By Mouth Two Times A Day 12)  Doxycycline Hyclate 100 Mg Caps (Doxycycline Hyclate) .... One By Mouth Daily  Allergies (verified): 1)  ! Adhesive Tape  Past History:  Past medical, surgical, family and social histories (including risk factors) reviewed for relevance to current acute and chronic problems.  Past Medical History: h/o hyperlipidemia, recently reasonable ldl at 12, stopped fish oil. 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  wheelchair bound  Has been referred to rehab for assistance with management. 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 brainsurgery for hydrocephalus - 07/31/2001, gastrectomy for ulcer - 07/31/2001, multiple leg/foot operations for spacticity - 07/31/2001  Family History: Reviewed history from 07/24/2006 and no changes required. Father died in his 70`s of cancer (unknown primary). Mother is still alive in her 74`s with multiple health problems including heart problems and memory problems. Pt. Has 1 brother and 2 sisters - apparently healthy.  Social History: Reviewed history from 08/08/2006 and no changes required. Lives at [facility]. No smoking. no etoh no sexual activity  Physical Exam  General:  Very disabled 50 year old in wheelchair , Vital signs noted , NAD  Head:  Normocephalic and atraumatic without obvious abnormalities. No apparent alopecia or balding. Mouth:  Oral mucosa and oropharynx without lesions or exudates. Lungs:  Normal respiratory effort, chest expands symmetrically. Lungs are clear to auscultation, no crackles or wheezes. Heart:  Normal rate and regular rhythm. S1 and S2 normal without gallop, murmur, click, rub or other extra sounds. Abdomen:  abdomen soft and non-tender without masses, organomegaly or  hernias noted. Msk:  L knee with no effusion, edema, warmth.  Limited ROM 2/2 CP, not significantly tender to palpation. Skin:  Intact without suspicious lesions or rashes.  face clear of acne.   Impression & Recommendations:  Problem # 1:  HEALTH MAINTENANCE EXAM (ICD-V70.0)  healthy MRCP.  living in group home.  physical form filled  out.    Orders: [facility] - Est  40-64 yrs ([id number])  Problem # 2:  PERSON LIVING IN RESIDENTIAL INSTITUTION (ICD-V60.6)  check TB.  rtc friday for read.  Orders: [facility] - Est  40-64 yrs ([id number])  Problem # 3:  ENCOUNTER FOR THERAPEUTIC DRUG MONITORING (ICD-V58.83) valproate monitoring Orders: [facility] - Est  40-64 yrs ([id number])Future Orders: Comp Met-[facility] ([id number]) ... 06/29/2010 CBC-[facility] ([id number]) ... 06/28/2010 Valproic Acid-[facility] ([id number]) ... 07/17/2010  Problem # 4:  IMPAIRED FASTING GLUCOSE (ICD-790.21) check CMP fasting next visit.  future order placed. Orders: [facility] - Est  40-64 yrs ([id number])Future Orders: Comp Met-[facility] ([id number]) ... 06/29/2010  Problem # 5:  HYPERLIPIDEMIA (ICD-272.4) off fish oil. Orders: [facility] - Est  40-64 yrs ([id number])Future Orders: Lipid-[facility] ([id number]) ... 07/05/2010 Comp Met-[facility] ([id number]) ... 06/29/2010  Labs Reviewed: SGOT: 12 (03/30/2009)   SGPT: 18 (03/30/2009)   HDL:47 (08/19/2007), 47 (07/31/2007)  LDL:121 (08/19/2007), 127 (07/31/2007)  Chol:206 (08/19/2007), 205 (07/31/2007)  Trig:190 (08/19/2007), 153 (07/31/2007)  Problem # 6:  ACNE VULGARIS (ICD-706.1)  on doxy since 2009 per previous PCP.  have advised to do a trial off this.  (already on retin A topical and acne seems very well controlled currently.) His updated medication list for this problem includes:    Retin-a 0.025 % Crea (Tretinoin) .Marland Kitchen... Apply at bedtime to face    Doxycycline Hyclate 100 Mg Caps (Doxycycline hyclate) ..... One by mouth daily  Problem # 7:  KNEE PAIN, LEFT (ICD-719.46) resolving patellar bursitis vs ligamentous sprain.  conservative measures. His updated medication list for this problem includes:    Baclofen 10 Mg Tabs (Baclofen) ..... One by mouth two times a day  Complete Medication List: 1)  Multivitamins Tabs (Multiple vitamin) .... One tab po daily 2)  Prevacid Solutab 30 Mg Tbdp (Lansoprazole) .... One tab po daily 3)  Hydrocortisone Acetate 1 % Crea  (Hydrocortisone acetate) .... Apply daily to face 4)  Triamcinolone Acetonide 0.1 % Crea (Triamcinolone acetonide) .... Apply to dry, itchy areas of skin on body (not face, axillae, or groin) up to 2 times daily as needed.  when not itchy, do not apply. dispense 1 pound jar 5)  Retin-a 0.025 % Crea (Tretinoin) .... Apply at bedtime to face 6)  Lorazepam 1 Mg Tabs (Lorazepam) .... One by mouth q 4 hours as needed 7)  Ketoconazole 2 % Crea (Ketoconazole) .... Apply to affected area daily, 30 gm 8)  Risperidone 1 Mg Tabs (Risperidone) .... One by mouth at bedtime 9)  Depakote 500 Mg Tbec (Divalproex sodium) .... Er 500mg  po 2 tablets at bedtime per [facility] 10)  Baclofen 10 Mg Tabs (Baclofen) .... One by mouth two times a day 11)  Doxycycline Hyclate 100 Mg Caps (Doxycycline hyclate) .... One by mouth daily  Patient Instructions: 1)  Please return in 1-2 months for follow up of blood work. 2)  STOP Doxycycline 3)  Fasting lipid panel next week in am between 8:30 and 11am, no appt necessary - we will read PPD then as well. 4)  PPD placed today. 5)  Good to meet you today!  Appended Document: cpe,tcb   Immunizations Administered:  PPD Skin Test:    Vaccine Type: PPD    Site: right forearm    Mfr: Sanofi Pasteur    Dose: 0.1 ml    Route: ID    Given by: Jone Baseman CMA    Exp. Date: 10/22/2011    Lot #: [id number] PPD placed @ 2:30pm.  Pt will come in for labs friday and have TB test read then. ............................................... Delora Fuel July 05, 2009 4:33 PM

## 2010-06-26 NOTE — Assessment & Plan Note (Signed)
Summary: f/u labs,df   Vital Signs:  Patient profile:   50 year old male Temp:     97.9 degrees F oral Pulse rate:   72 / minute BP sitting:   120 / 80  (right arm)  Vitals Entered By: Arlyss Repress CMA, (Oct 02, 2009 1:42 PM) CC: f/up labs. Is Patient Diabetic? No Pain Assessment Patient in pain? no        Primary Care Provider:  Ardeen Garland  MD  CC:  f/up labs..  History of Present Illness: [name] comes in with the caregiver from his group home, [facility], to follow-up labs obtained during his CPE in February.  There was concern for impaired fasting glucose in the past.  He is on depakote.  He has MR-CP.  No acute complaints or concerns today.  Feeling well. 1) Labs - CBC, CMET, FLP all 100% normal.  Cholesterol excellent.  Depakote level appropriate.  No need to change or add any medications at this time.  Discussed with patient and caregiver.   Habits & Providers  Alcohol-Tobacco-Diet     Tobacco Status: never  Current Medications (verified): 1)  Multivitamins   Tabs (Multiple Vitamin) .... One Tab Po Daily 2)  Prevacid Solutab 30 Mg  Tbdp (Lansoprazole) .... One Tab Po Daily 3)  Hydrocortisone Acetate 1 %  Crea (Hydrocortisone Acetate) .... Apply Daily To Face 4)  Triamcinolone Acetonide 0.1 % Crea (Triamcinolone Acetonide) .... Apply To Dry, Itchy Areas of Skin On Body (Not Face, Axillae, or Groin) Up To 2 Times Daily As Needed.  When Not Itchy, Do Not Apply. Dispense 1 Pound Jar 5)  Retin-A 0.025 %  Crea (Tretinoin) .... Apply At Bedtime To Face 6)  Lorazepam 1 Mg Tabs (Lorazepam) .... One By Mouth Q 4 Hours As Needed 7)  Ketoconazole 2 % Crea (Ketoconazole) .... Apply To Affected Area Daily, 30 Gm 8)  Risperidone 1 Mg Tabs (Risperidone) .... One By Mouth At Bedtime 9)  Depakote 500 Mg Tbec (Divalproex Sodium) .... Er 500mg  Po 2 Tablets At Bedtime Per [facility] 10)  Baclofen 10 Mg Tabs (Baclofen) .... One By Mouth Two Times A Day 11)  Doxycycline Hyclate 100 Mg  Caps (Doxycycline Hyclate) .... One By Mouth Daily  Allergies: 1)  ! Adhesive Tape  Physical Exam  General:  alert, well-developed, pleasant, in wheelchair, NAD vitals reviewed   Impression & Recommendations:  Problem # 1:  IMPAIRED FASTING GLUCOSE (ICD-790.21) Assessment Improved  All labs normal.  No evidence for diabetes (or impaired fasting glucose).  No chagnes to medications.   Orders: [facility]- Est Level  3 (16109)  Complete Medication List: 1)  Multivitamins Tabs (Multiple vitamin) .... One tab po daily 2)  Prevacid Solutab 30 Mg Tbdp (Lansoprazole) .... One tab po daily 3)  Hydrocortisone Acetate 1 % Crea (Hydrocortisone acetate) .... Apply daily to face 4)  Triamcinolone Acetonide 0.1 % Crea (Triamcinolone acetonide) .... Apply to dry, itchy areas of skin on body (not face, axillae, or groin) up to 2 times daily as needed.  when not itchy, do not apply. dispense 1 pound jar 5)  Retin-a 0.025 % Crea (Tretinoin) .... Apply at bedtime to face 6)  Lorazepam 1 Mg Tabs (Lorazepam) .... One by mouth q 4 hours as needed 7)  Ketoconazole 2 % Crea (Ketoconazole) .... Apply to affected area daily, 30 gm 8)  Risperidone 1 Mg Tabs (Risperidone) .... One by mouth at bedtime 9)  Depakote 500 Mg Tbec (Divalproex sodium) .Marland KitchenMarland KitchenMarland Kitchen  Er 500mg  po 2 tablets at bedtime per [facility] 10)  Baclofen 10 Mg Tabs (Baclofen) .... One by mouth two times a day 11)  Doxycycline Hyclate 100 Mg Caps (Doxycycline hyclate) .... One by mouth daily

## 2010-06-28 ENCOUNTER — Ambulatory Visit: Admit: 2010-06-28 | Payer: Self-pay

## 2010-06-28 ENCOUNTER — Encounter: Payer: Medicare Other | Admitting: Family Medicine

## 2010-06-28 ENCOUNTER — Encounter: Payer: Self-pay | Admitting: Family Medicine

## 2010-06-28 ENCOUNTER — Encounter (HOSPITAL_BASED_OUTPATIENT_CLINIC_OR_DEPARTMENT_OTHER): Payer: Medicare Other | Attending: Internal Medicine

## 2010-06-28 DIAGNOSIS — L97309 Non-pressure chronic ulcer of unspecified ankle with unspecified severity: Secondary | ICD-10-CM | POA: Insufficient documentation

## 2010-06-28 DIAGNOSIS — I872 Venous insufficiency (chronic) (peripheral): Secondary | ICD-10-CM | POA: Insufficient documentation

## 2010-06-28 NOTE — Assessment & Plan Note (Signed)
Summary: uti?,df   Vital Signs:  Patient profile:   50 year old male Temp:     98.4 degrees F oral Pulse rate:   80 / minute BP sitting:   124 / 79  (left arm) Cuff size:   regular  Vitals Entered By: Tessie Fass CMA (June 14, 2010 11:25 AM) CC: UTI   Primary Care Provider:  . [facility]  CC:  UTI.  History of Present Illness: 50 yo M:  1. Dysuria: x 1 day, with gross hematuria x 1. Denies fever/chills, N/V/D/C, abdominal pain, back pain. On Rx Detrol for urinary incontinence.  Current Medications (verified): 1)  Multivitamins   Tabs (Multiple Vitamin) .... One Tab Po Daily 2)  Prevacid Solutab 30 Mg  Tbdp (Lansoprazole) .... One Tab Po Daily 3)  Hydrocortisone Acetate 1 %  Crea (Hydrocortisone Acetate) .... Apply Daily To Face 4)  Triamcinolone Acetonide 0.1 % Crea (Triamcinolone Acetonide) .... Apply To Dry, Itchy Areas of Skin On Body (Not Face, Axillae, or Groin) Up To 2 Times Daily As Needed.  When Not Itchy, Do Not Apply. Dispense 1 Pound Jar 5)  Retin-A 0.025 %  Crea (Tretinoin) .... Apply At Bedtime To Face 6)  Lorazepam 1 Mg Tabs (Lorazepam) .... One By Mouth Q 4 Hours As Needed 7)  Ketoconazole 2 % Crea (Ketoconazole) .... Apply To Affected Area Daily, 30 Gm 8)  Risperidone 1 Mg Tabs (Risperidone) .... One By Mouth At Bedtime 9)  Depakote 500 Mg Tbec (Divalproex Sodium) .... Er 500mg  Po 2 Tablets At Bedtime Per [facility] 10)  Baclofen 10 Mg Tabs (Baclofen) .... One By Mouth Two Times A Day 11)  Doxycycline Hyclate 100 Mg Caps (Doxycycline Hyclate) .... One By Mouth Daily 12)  Detrol La 2 Mg  Cp24 (Tolterodine Tartrate) .Marland Kitchen.. 1 Tablet By Mouth Daily  Allergies (verified): 1)  ! Adhesive Tape PMH-FH-SH reviewed for relevance  Review of Systems      See HPI  Physical Exam  General:  Well-nourished, in wheel chair, NAD. Vitals reviewed. Abdomen:  Bowel sounds positive,abdomen soft and non-tender without masses, organomegaly or hernias noted.  No  suprapubic tenderness. No CVA tenderness. Skin:  Intact without suspicious lesions or rashes.   Impression & Recommendations:  Problem # 1:  DYSURIA (ICD-788.1) Assessment Unchanged No red flags. Will check UA.  Orders: Urinalysis-[facility] (00000) [facility]- Est Level  3 (11914)  Complete Medication List: 1)  Multivitamins Tabs (Multiple vitamin) .... One tab po daily 2)  Prevacid Solutab 30 Mg Tbdp (Lansoprazole) .... One tab po daily 3)  Hydrocortisone Acetate 1 % Crea (Hydrocortisone acetate) .... Apply daily to face 4)  Triamcinolone Acetonide 0.1 % Crea (Triamcinolone acetonide) .... Apply to dry, itchy areas of skin on body (not face, axillae, or groin) up to 2 times daily as needed.  when not itchy, do not apply. dispense 1 pound jar 5)  Retin-a 0.025 % Crea (Tretinoin) .... Apply at bedtime to face 6)  Lorazepam 1 Mg Tabs (Lorazepam) .... One by mouth q 4 hours as needed 7)  Ketoconazole 2 % Crea (Ketoconazole) .... Apply to affected area daily, 30 gm 8)  Risperidone 1 Mg Tabs (Risperidone) .... One by mouth at bedtime 9)  Depakote 500 Mg Tbec (Divalproex sodium) .... Er 500mg  po 2 tablets at bedtime per [facility] 10)  Baclofen 10 Mg Tabs (Baclofen) .... One by mouth two times a day 11)  Doxycycline Hyclate 100 Mg Caps (Doxycycline hyclate) .... One by mouth daily  12)  Detrol La 2 Mg Cp24 (Tolterodine tartrate) .Marland Kitchen.. 1 tablet by mouth daily  Patient Instructions: 1)  Bring the urine back for analysis.   Orders Added: 1)  Urinalysis-[facility] [00000] 2)  [facility]- Est Level  3 [99213]    Laboratory Results   Urine Tests  Date/Time Received: June 14, 2010 12:16 PM  Date/Time Reported: June 14, 2010 12:25 PM   Routine Urinalysis   Color: yellow Appearance: Clear Glucose: negative   (Normal Range: Negative) Bilirubin: negative   (Normal Range: Negative) Ketone: negative   (Normal Range: Negative) Spec. Gravity: 1.015   (Normal Range: 1.003-1.035) Blood: negative    (Normal Range: Negative) pH: 7.0   (Normal Range: 5.0-8.0) Protein: negative   (Normal Range: Negative) Urobilinogen: 0.2   (Normal Range: 0-1) Nitrite: negative   (Normal Range: Negative) Leukocyte Esterace: negative   (Normal Range: Negative)    Comments: .........Marland Kitchenbiochemical negative; microscopic not indicated ...............test performed by......Marland KitchenBonnie A. [place], MLS (ASCP)cm

## 2010-06-29 NOTE — Consult Note (Signed)
Summary: [facility]   Imported By: Bradly Bienenstock 07/28/2009 12:22:16  _____________________________________________________________________  External Attachment:    Type:   Image     Comment:   External Document

## 2010-07-02 ENCOUNTER — Other Ambulatory Visit: Payer: Self-pay | Admitting: Family Medicine

## 2010-07-02 ENCOUNTER — Encounter: Payer: Self-pay | Admitting: *Deleted

## 2010-07-02 ENCOUNTER — Ambulatory Visit (INDEPENDENT_AMBULATORY_CARE_PROVIDER_SITE_OTHER): Payer: Medicare Other

## 2010-07-02 DIAGNOSIS — E785 Hyperlipidemia, unspecified: Secondary | ICD-10-CM | POA: Insufficient documentation

## 2010-07-02 DIAGNOSIS — Z111 Encounter for screening for respiratory tuberculosis: Secondary | ICD-10-CM

## 2010-07-02 LAB — CBC
HCT: 39.9 % (ref 39.0–52.0)
Hemoglobin: 12.3 g/dL — ABNORMAL LOW (ref 13.0–17.0)
MCH: 26.9 pg (ref 26.0–34.0)
MCHC: 30.8 g/dL (ref 30.0–36.0)
MCV: 87.3 fL (ref 78.0–100.0)
Platelets: 266 K/uL (ref 150–400)
RBC: 4.57 MIL/uL (ref 4.22–5.81)
RDW: 14.4 % (ref 11.5–15.5)
WBC: 8.3 K/uL (ref 4.0–10.5)

## 2010-07-02 LAB — COMPREHENSIVE METABOLIC PANEL WITH GFR
ALT: 45 U/L (ref 0–53)
AST: 46 U/L — ABNORMAL HIGH (ref 0–37)
Albumin: 4.1 g/dL (ref 3.5–5.2)
Alkaline Phosphatase: 141 U/L — ABNORMAL HIGH (ref 39–117)
BUN: 18 mg/dL (ref 6–23)
CO2: 27 meq/L (ref 19–32)
Calcium: 9.6 mg/dL (ref 8.4–10.5)
Chloride: 106 meq/L (ref 96–112)
Creat: 0.51 mg/dL (ref 0.40–1.50)
Glucose, Bld: 101 mg/dL — ABNORMAL HIGH (ref 70–99)
Potassium: 4 meq/L (ref 3.5–5.3)
Sodium: 144 meq/L (ref 135–145)
Total Bilirubin: 0.2 mg/dL — ABNORMAL LOW (ref 0.3–1.2)
Total Protein: 6.8 g/dL (ref 6.0–8.3)

## 2010-07-02 LAB — LDL CHOLESTEROL, DIRECT: Direct LDL: 94 mg/dL

## 2010-07-04 ENCOUNTER — Ambulatory Visit (INDEPENDENT_AMBULATORY_CARE_PROVIDER_SITE_OTHER): Payer: Medicare Other | Admitting: *Deleted

## 2010-07-04 DIAGNOSIS — Z111 Encounter for screening for respiratory tuberculosis: Secondary | ICD-10-CM

## 2010-07-04 NOTE — Progress Notes (Signed)
Patient in for PPD to be read. PPD negative. 0 mm . Note given to patient

## 2010-07-12 NOTE — Assessment & Plan Note (Signed)
Summary: TB TEST/LAB/RH   Allergies: 1)  ! Adhesive Tape   Complete Medication List: 1)  Multivitamins Tabs (Multiple vitamin) .... One tab po daily 2)  Prevacid Solutab 30 Mg Tbdp (Lansoprazole) .... One tab po daily 3)  Hydrocortisone Acetate 1 % Crea (Hydrocortisone acetate) .... Apply daily to face 4)  Triamcinolone Acetonide 0.1 % Crea (Triamcinolone acetonide) .... Apply to dry, itchy areas of skin on body (not face, axillae, or groin) up to 2 times daily as needed.  when not itchy, do not apply. dispense 1 pound jar 5)  Retin-a 0.025 % Crea (Tretinoin) .... Apply at bedtime to face 6)  Lorazepam 1 Mg Tabs (Lorazepam) .... One by mouth q 4 hours as needed 7)  Ketoconazole 2 % Crea (Ketoconazole) .... Apply to affected area daily, 30 gm 8)  Risperidone 1 Mg Tabs (Risperidone) .... One by mouth at bedtime 9)  Depakote 500 Mg Tbec (Divalproex sodium) .... Er 500mg  po 2 tablets at bedtime per [facility] 10)  Baclofen 10 Mg Tabs (Baclofen) .... One by mouth two times a day 11)  Doxycycline Hyclate 100 Mg Caps (Doxycycline hyclate) .... One by mouth daily 12)  Detrol La 2 Mg Cp24 (Tolterodine tartrate) .Marland Kitchen.. 1 tablet by mouth daily  Other Orders: TB Skin Test [phone]) Admin 1st Vaccine (60454)   Orders Added: 1)  TB Skin Test [86580] 2)  Admin 1st Vaccine [90471]   Immunizations Administered:  PPD Skin Test:    Vaccine Type: PPD    Site: left forearm    Mfr: Sanofi Pasteur    Dose: 0.1 ml    Route: ID    Given by: Theresia Lo RN    Lot #: [id number]   Immunizations Administered:  PPD Skin Test:    Vaccine Type: PPD    Site: left forearm    Mfr: Sanofi Pasteur    Dose: 0.1 ml    Route: ID    Given by: Theresia Lo RN    Lot #: [id number]    Appended Document: Lab Order    Lab Visit  Orders Today: Direct LDL-[facility] [phone]

## 2010-07-12 NOTE — Assessment & Plan Note (Signed)
Summary: cpe,df   Vital Signs:  Patient profile:   50 year old male Temp:     98.5 degrees F oral Pulse rate:   72 / minute BP sitting:   110 / 70  (left arm) Cuff size:   regular  Vitals Entered By: Arlyss Repress CMA, (June 28, 2010 3:24 PM) CC: fill out FMLA form. physical. Is Patient Diabetic? No Pain Assessment Patient in pain? no        Primary Provider:  . [facility]  CC:  fill out FMLA form. physical..  History of Present Illness: Pt. here  with caretake for yearly physical and to have FL-2 form filled out for continued services.  Overall doing well since last visit, no complaints or concerns today.    Current Medications (verified): 1)  Multivitamins   Tabs (Multiple Vitamin) .... One Tab Po Daily 2)  Prevacid Solutab 30 Mg  Tbdp (Lansoprazole) .... One Tab Po Daily 3)  Hydrocortisone Acetate 1 %  Crea (Hydrocortisone Acetate) .... Apply Daily To Face 4)  Triamcinolone Acetonide 0.1 % Crea (Triamcinolone Acetonide) .... Apply To Dry, Itchy Areas of Skin On Body (Not Face, Axillae, or Groin) Up To 2 Times Daily As Needed.  When Not Itchy, Do Not Apply. Dispense 1 Pound Jar 5)  Retin-A 0.025 %  Crea (Tretinoin) .... Apply At Bedtime To Face 6)  Lorazepam 1 Mg Tabs (Lorazepam) .... One By Mouth Q 4 Hours As Needed 7)  Ketoconazole 2 % Crea (Ketoconazole) .... Apply To Affected Area Daily, 30 Gm 8)  Risperidone 1 Mg Tabs (Risperidone) .... One By Mouth At Bedtime 9)  Depakote 500 Mg Tbec (Divalproex Sodium) .... Er 500mg  Po 2 Tablets At Bedtime Per [facility] 10)  Baclofen 10 Mg Tabs (Baclofen) .... One By Mouth Two Times A Day 11)  Doxycycline Hyclate 100 Mg Caps (Doxycycline Hyclate) .... One By Mouth Daily 12)  Detrol La 2 Mg  Cp24 (Tolterodine Tartrate) .Marland Kitchen.. 1 Tablet By Mouth Daily  Allergies (verified): 1)  ! Adhesive Tape  Past History:  Past Medical History: Last updated: 07/05/2009 h/o hyperlipidemia, recently reasonable ldl at 12, stopped fish  oil. 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  wheelchair bound  Has been referred to rehab for assistance with management. 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 Mother is still alive in her 52`s with multiple health problems including heart problems and memory problems. Pt. Has 1 brother and 2 sisters - apparently healthy.  Social History: Last updated: 08/08/2006 Lives at [facility] No smoking. no etoh no sexual activity  Review of Systems       .ros  Physical Exam  General:  alert, well-nourished, and unkempt.   Head:  normocephalic, atraumatic, and male-pattern balding.   Eyes:  pupils equal, pupils round, and pupils reactive to light.   Ears:  R ear normal and L ear normal.   Mouth:  Oral mucosa and oropharynx without lesions or exudates.   Neck:  No deformities, masses, or tenderness noted. Lungs:  Normal respiratory effort, chest expands symmetrically. Lungs are clear to auscultation,  no crackles or wheezes. Heart:  Normal rate and regular rhythm. S1 and S2 normal without gallop, murmur, click, rub or other extra sounds. Abdomen:  Bowel sounds positive,abdomen soft and non-tender without masses, organomegaly or hernias noted.  No suprapubic tenderness. No CVA tenderness. Msk:  In wheelchair, some muscle spasticity in lower legs. Pulses:  dorsalis pedis and posterior tibial pulses are full and equal bilaterally Neurologic:  Poor patient cooperation,  difficult to assess full neurological status.  Wheelchair bound with difficulty with ambulation 2/2 to CP Skin:  Dry but without rashes.  Stasis ulcer on lateral malleoli b/l   Impression & Recommendations:  Problem # 1:  HEALTH MAINTENANCE EXAM (ICD-V70.0) Doing well no concerns today.  Will obtain yearly labs.  Paperwork filled out but area asking about TB status left blank, due to not having PPD yet this year.  Told to bring papers back once he has ppd done. Orders: [facility] - Est  40-64 yrs (04540)  Problem # 2:  HYPERLIPIDEMIA (ICD-272.4) WIll get lipid panel when fasting, future orders placed Orders: [facility] - Est  40-64 yrs ([id number])  Problem # 3:  ENCOUNTER FOR THERAPEUTIC DRUG MONITORING (ICD-V58.83) On depakote, will check depakote level as well as CMET  and CBC Orders: [facility] - Est  40-64 yrs (99396)Future Orders: Valproic Acid-[facility] ([id number]) ... 07/02/2010 CBC-[facility] (30865) ... 07/02/2010 Comp Met-[facility] ([id number]) ... 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 One tab po daily 2)  Prevacid Solutab 30 Mg Tbdp (Lansoprazole) .... One tab po daily 3)  Hydrocortisone Acetate 1 % Crea (Hydrocortisone acetate) .... Apply daily to face 4)  Triamcinolone Acetonide 0.1 % Crea (Triamcinolone acetonide) .... Apply to dry, itchy areas of skin on body (not face, axillae, or groin) up to 2 times daily as needed.  when not itchy, do not apply. dispense 1 pound jar 5)  Retin-a 0.025 % Crea (Tretinoin) .... Apply at bedtime to face 6)  Lorazepam 1 Mg Tabs (Lorazepam) .... One by mouth q 4 hours as needed 7)  Ketoconazole 2 % Crea (Ketoconazole) .... Apply to affected area daily, 30 gm 8)  Risperidone 1 Mg Tabs (Risperidone) .... One by mouth at bedtime 9)  Depakote 500 Mg Tbec (Divalproex sodium) .... Er  500mg  po 2 tablets at bedtime per [facility] 10)  Baclofen 10 Mg Tabs (Baclofen) .... One by mouth two times a day 11)  Doxycycline Hyclate 100 Mg Caps (Doxycycline hyclate) .... One by mouth daily 12)  Detrol La 2 Mg Cp24 (Tolterodine tartrate) .Marland Kitchen.. 1 tablet by mouth daily   Orders Added: 1)  Valproic Acid-[facility] [[id number]] 2)  CBC-[facility] [85027] 3)  Comp Met-[facility] [[id number]] 4)  [facility] - Est  40-64 yrs [[id number]]

## 2010-07-26 ENCOUNTER — Encounter (HOSPITAL_BASED_OUTPATIENT_CLINIC_OR_DEPARTMENT_OTHER): Payer: Medicare Other | Attending: Internal Medicine

## 2010-07-26 DIAGNOSIS — I872 Venous insufficiency (chronic) (peripheral): Secondary | ICD-10-CM | POA: Insufficient documentation

## 2010-07-26 DIAGNOSIS — L97309 Non-pressure chronic ulcer of unspecified ankle with unspecified severity: Secondary | ICD-10-CM | POA: Insufficient documentation

## 2010-07-31 ENCOUNTER — Other Ambulatory Visit: Payer: Self-pay | Admitting: Family Medicine

## 2010-07-31 MED ORDER — TRIAMCINOLONE ACETONIDE 0.1 % EX CREA: TOPICAL_CREAM | Freq: Two times a day (BID) | CUTANEOUS | Status: DC

## 2010-07-31 MED ORDER — LANSOPRAZOLE 30 MG PO TBDP: 30.0000 mg | ORAL_TABLET | Freq: Every day | ORAL | Status: DC

## 2010-07-31 MED ORDER — TOLTERODINE TARTRATE ER 2 MG PO CP24: 2.0000 mg | ORAL_CAPSULE | Freq: Every day | ORAL | Status: DC

## 2010-07-31 MED ORDER — MULTIVITAMINS PO CAPS: 1.0000 | ORAL_CAPSULE | Freq: Every day | ORAL | Status: DC

## 2010-07-31 NOTE — Telephone Encounter (Signed)
Faxed to [employer] [phone]

## 2010-08-29 ENCOUNTER — Encounter (HOSPITAL_BASED_OUTPATIENT_CLINIC_OR_DEPARTMENT_OTHER): Payer: Medicare Other | Attending: Internal Medicine

## 2010-08-29 DIAGNOSIS — I872 Venous insufficiency (chronic) (peripheral): Secondary | ICD-10-CM | POA: Insufficient documentation

## 2010-08-29 DIAGNOSIS — L97309 Non-pressure chronic ulcer of unspecified ankle with unspecified severity: Secondary | ICD-10-CM | POA: Insufficient documentation

## 2010-09-11 LAB — COMPREHENSIVE METABOLIC PANEL
ALT: 20 U/L (ref 0–53)
AST: 23 U/L (ref 0–37)
Albumin: 4 g/dL (ref 3.5–5.2)
Alkaline Phosphatase: 115 U/L (ref 39–117)
BUN: 15 mg/dL (ref 6–23)
Chloride: 109 mEq/L (ref 96–112)
Potassium: 4 mEq/L (ref 3.5–5.1)
Sodium: 147 mEq/L — ABNORMAL HIGH (ref 135–145)
Total Bilirubin: 0.3 mg/dL (ref 0.3–1.2)
Total Protein: 7.4 g/dL (ref 6.0–8.3)

## 2010-09-11 LAB — DIFFERENTIAL
Basophils Absolute: 0.1 10*3/uL (ref 0.0–0.1)
Basophils Relative: 1 % (ref 0–1)
Eosinophils Absolute: 0.1 10*3/uL (ref 0.0–0.7)
Eosinophils Relative: 1 % (ref 0–5)
Monocytes Absolute: 0.5 10*3/uL (ref 0.1–1.0)
Monocytes Relative: 6 % (ref 3–12)
Neutro Abs: 5.8 10*3/uL (ref 1.7–7.7)

## 2010-09-11 LAB — URINALYSIS, ROUTINE W REFLEX MICROSCOPIC
Hgb urine dipstick: NEGATIVE
Ketones, ur: 15 mg/dL — AB
Nitrite: NEGATIVE
Urobilinogen, UA: 1 mg/dL (ref 0.0–1.0)

## 2010-09-11 LAB — CBC
HCT: 44.8 % (ref 39.0–52.0)
Platelets: 283 10*3/uL (ref 150–400)
WBC: 7.9 10*3/uL (ref 4.0–10.5)

## 2010-09-11 LAB — GLUCOSE, CAPILLARY
Glucose-Capillary: 157 mg/dL — ABNORMAL HIGH (ref 70–99)
Glucose-Capillary: 55 mg/dL — ABNORMAL LOW (ref 70–99)

## 2010-09-18 ENCOUNTER — Encounter: Payer: Medicare Other | Attending: Physical Medicine & Rehabilitation

## 2010-09-18 ENCOUNTER — Ambulatory Visit: Payer: Medicare Other | Admitting: Physical Medicine & Rehabilitation

## 2010-09-18 DIAGNOSIS — S43429A Sprain of unspecified rotator cuff capsule, initial encounter: Secondary | ICD-10-CM

## 2010-09-18 DIAGNOSIS — Z79899 Other long term (current) drug therapy: Secondary | ICD-10-CM | POA: Insufficient documentation

## 2010-09-18 DIAGNOSIS — G808 Other cerebral palsy: Secondary | ICD-10-CM

## 2010-09-18 DIAGNOSIS — M25519 Pain in unspecified shoulder: Secondary | ICD-10-CM | POA: Insufficient documentation

## 2010-09-18 NOTE — Assessment & Plan Note (Signed)
REASON FOR VISIT:  Left shoulder pain.  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  At that time, we increased his baclofen to 20 p.o. t.i.d. for his spasticity.  In the interval time, he has had some left shoulder pain, but after further questioning, it really just started yesterday when he was pulling up on a bar to stand for his shower.  He is severely disabled basically wheelchair-bound, is dependent with his transfers.  He does have some functional use in his upper extremities, can feed himself, but otherwise requires total care at [facility].  He has had no new medical issues.  He has tolerated the increased dose of baclofen without sedation per his caregiver.  REVIEW OF SYSTEMS:  Positive for bladder control problems, spasms, confusion, depression.  PHYSICAL EXAMINATION:  MUSCULOSKELETAL:  He has mildly positive impingement sign in left upper extremity.  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  He has severe spasticity bilateral upper extremity flexor muscles, bilateral lower extremity abductor muscles. He has atrophic lower limbs.  IMPRESSION: 1. 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  No signs     of sedation or other adverse events. 2. Left shoulder pain.  Suspect this may be a subacromial bursitis,     although it is only been going on for about a day.  They can use     nonsteroidals, already has standing orders at [facility] if he     needs to use several time a day, nonsteroidals for couple weeks or     more, they should call back to get injection scheduled at this     office.  Discussed with the patient and caregiver.  Agreed with     plan, otherwise, I will see him back in 4 months.     [name], M.D. Electronically Signed    AEK/MedQ D:  09/18/2010 11:51:25  T:  09/18/2010 23:49:40  Job #:   [id number]

## 2010-09-19 ENCOUNTER — Telehealth: Payer: Self-pay | Admitting: Family Medicine

## 2010-09-19 DIAGNOSIS — G809 Cerebral palsy, unspecified: Secondary | ICD-10-CM

## 2010-09-19 NOTE — Telephone Encounter (Signed)
[name], program director at group home is requesting a referral for a PT and motorized wheelchair to be faxed or mailed to the home.  Originally was given one for a regular wheelchair, but now would like to have for motorized.

## 2010-09-27 ENCOUNTER — Encounter (HOSPITAL_BASED_OUTPATIENT_CLINIC_OR_DEPARTMENT_OTHER): Payer: Medicare Other | Attending: Internal Medicine

## 2010-09-27 DIAGNOSIS — L97309 Non-pressure chronic ulcer of unspecified ankle with unspecified severity: Secondary | ICD-10-CM | POA: Insufficient documentation

## 2010-09-27 DIAGNOSIS — Z79899 Other long term (current) drug therapy: Secondary | ICD-10-CM | POA: Insufficient documentation

## 2010-09-27 DIAGNOSIS — I872 Venous insufficiency (chronic) (peripheral): Secondary | ICD-10-CM | POA: Insufficient documentation

## 2010-10-04 NOTE — Telephone Encounter (Signed)
[name], has this been addressed yet?

## 2010-10-09 NOTE — Assessment & Plan Note (Signed)
[facility]   NAME:  [name], [name] NO.:  [id number]   MEDICAL RECORD NO.:  [id number]      DATE OF BIRTH:  1961-03-23   PHYSICIAN:  Jonelle Sports. Sevier, M.D.  VISIT DATE:  12/16/2007                                   OFFICE VISIT   HISTORY:  This is a 50 year old retired white male from a chronic care  facility is brought in by his caregivers today for our ongoing care of a  left inframalleolar venous stasis ulceration.  He has been most recently  in a Profore light wrap and has shown continued progress.   His caregivers report no problems since his last visit here,  specifically no increased odor, no fever or systemic symptoms.   EXAMINATION:  Blood pressure 129/82, pulse 90, respirations not  recorded, temperature 97.6.  The wound itself now on the inframalleolar  area on the medial aspect of the left hind foot measures 1.0 x 1.0 x  0.25 cm with a reasonably clean base.  There is considerable maceration  of surrounding skin with some desquamative skin accumulation present.   IMPRESSION:  Satisfactory progress of venous stasis ulcer, left lower  extremity.   DISPOSITION:  The wound is selectively debrided of all of this  desquamated skin, and although the tissue underlying is slightly  reddened, it looks basically pretty healthy.   The wound will be dressed with an application of a Silverlon pad, a zinc  oxide barrier cream will be placed on all the surrounding reddened,  previously macerated tissue.  The area will be covered with a SofSorb  pad, and he will be returned to a Profore light wrap.   He will be seen weekly by the nurse for the next 2 weeks for dressing  changes and will be seen by the physician again in 3 weeks.           ______________________________  Jonelle Sports Cheryll Cockayne, M.D.     RES/MEDQ  D:  12/16/2007  T:  12/17/2007  Job:  [id number]

## 2010-10-09 NOTE — Assessment & Plan Note (Signed)
[facility]   NAME:  [name], [name] NO.:  [id number]   MEDICAL RECORD NO.:  [id number]      DATE OF BIRTH:  [birth year]   PHYSICIAN:  Jonelle Sports. Sevier, M.D.  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  This has been under treatment here  for some time and has made very slow progress most recently secondary  apparently to some fungal superinfection involving the adjacent skin as  well.   Today, the patient arrives with no particular complaints related to his  wound nor does the dispatching facility indicate any particular concern.   PHYSICAL EXAMINATION:  Blood pressure 119/74, pulse 87, respirations 16,  and temperature 97.8.  The wound on the left medial malleolar area now  measures 3.7 x 4.3 x 0.1 cm, which is slightly smaller, but the entire  wound base is covered with a speckled tensely adherent slough and/or  biofilm.  The surrounding skin is indeed reddened and bumpy and because  there is nothing, which I would implicate a contact dermatitis.  I think  that low-grade fungal infection is likely indeed the proper diagnosis.   IMPRESSION:  Slow, but acceptable progress of stasis ulceration, medial  left lower extremity.   DISPOSITION:  Using 5% lidocaine ointment for anesthetizing, this wound  is sharply debrided of the adherent slough and/or biofilm in its base.  This is reasonably well tolerated by the patient.  The wound is then  dressed with an application of silver alginate covered with SofSorb and  a bulky wrap.  The periwound area is treated with a generous application  of ketoconazole overlaying with 0.1% triamcinolone cream.   The home health nurse will visit his group home and change this dressing  on a three times weekly basis.   Follow up visit will be here in 2 weeks.           ______________________________  Jonelle Sports Cheryll Cockayne, M.D.     RES/MEDQ  D:  05/18/2008  T:  05/19/2008  Job:  [id number]

## 2010-10-09 NOTE — Discharge Summary (Signed)
NAME:  [name], [name] NO.:  [id number]   MEDICAL RECORD NO.:  [id number]          PATIENT TYPE:  REC   LOCATION:  FOOT                         FACILITY:  [facility]   PHYSICIAN:  Barry Dienes. Eloise Harman, M.D.DATE OF BIRTH:  11-10-1960   DATE OF ADMISSION:  01/01/2008  DATE OF DISCHARGE:                               DISCHARGE SUMMARY   SUBJECTIVE:  The patient is a 50 year old Caucasian man from a chronic  care facility who was seen for reevaluation of a left inframalleolar  venous stasis ulcer.  He has been treated with a Silverlon pad, a zinc  oxide barrier cream, and a Profore Lite wrap.  He has been seen weekly  by nursing staff for dressing changes.  He has not had significant pain  in the area or fever or chills.   OBJECTIVE:  Just distal to the left medial malleolus, there is a venous  stasis ulcer that measures 1.2 cm x 1.6 cm x 0.25 cm.  There is not  significant eschar.  The wound base has a red color, and there is some  red granulation tissue.  There is no exposed bone, muscle, or tendon.  There is a minimal amount of serosanguineous drainage, and the periwound  area is intact.  There was some necrotic callus surrounding the ulcer.   ASSESSMENT:  Left leg venous stasis ulcer, slowly healing, with some  surrounding necrotic tissue.   PLAN:  The necrotic tissue surrounding the stasis ulcer was debrided  using a #15 scalpel without significant patient discomfort.  The wound  was cleaned with soap and water.  The wound was covered with Silverlon  and a periwound zinc barrier.  The wound was then covered with SofSorb  and wrapped with a Profore Lite compression wrap.  The patient will be  seen weekly for dressing changes by the nursing staff.  A physician  followup is planned in approximately 3 weeks.           ______________________________  Barry Dienes Eloise Harman, M.D.     DGP/MEDQ  D:  01/05/2008  T:  01/05/2008  Job:  [id number]

## 2010-10-09 NOTE — Assessment & Plan Note (Signed)
[facility]   NAME:  [name], SCHMADER NO.:  [id number]   MEDICAL RECORD NO.:  [id number]      DATE OF BIRTH:  08-19-1960   PHYSICIAN:  Joanne Gavel, M.D.         VISIT DATE:  01/10/2009                                   OFFICE VISIT   HISTORY OF PRESENT ILLNESS:  This is a multiple recurrent left medial  ankle wound in a 50 year old man with cerebral palsy.  This has probably  started off as a pressure wound and it has been recently treated with  Silvadene and Kerlix wrap.  Last week was treated with Prisma and  hydrogel.   Present history and physical reveals temperature 98, pulse 80,  respirations 18, blood pressure 138/80.  The ulcer is 1 x 1.3.  It is  very superficial covered by a relatively thick slough.   TREATMENT:  The slough was removed with forceps.  Hemostasis was  obtained with pressure.  The base of the wound is a nice bright red.   PLAN:  Continue Prisma, hydrogel, Profore light, and see in 7 days.      Joanne Gavel, M.D.  Electronically Signed     RA/MEDQ  D:  01/10/2009  T:  01/11/2009  Job:  [id number]

## 2010-10-09 NOTE — Assessment & Plan Note (Signed)
[facility]   NAME:  [name], [name] NO.:  [id number]   MEDICAL RECORD NO.:  [id number]      DATE OF BIRTH:  08-15-1960   PHYSICIAN:  Theresia Majors. Tanda Rockers, M.D. VISIT DATE:  11/13/2007                                   OFFICE VISIT   SUBJECTIVE:  [name] is a 50 year old man who we have followed for a  stage I-II pressure ulcer involving the left medial lower extremity.  In  the interim, he has had increased swelling and redness.  He is  accompanied by a caregiver who is not involved in his particular wound  management.   OBJECTIVE:  Blood pressure is 132/81, respirations 16, pulse rate 80,  and temperature is 98.4.  Inspection of the left lower extremity shows  2+ edema from the distal third of the left lower extremity onto the  foot.  The area of ulceration on the posterior medial aspect includes an  area with granulation and marked hyperemia.  There is no particular  tenderness.  The edema is pitting.  There is no excessive drainage.  The  foot is warm, but it is not feverish.  There is brisk capillary refill.   IMPRESSION:  Pressure ulcer with stasis pathophysiology.   PLAN:  We will provide an Acticoat plus Profore Lite compression.  We  have also instructed the caregiver to advise the wound care and nursing  service that this patient should keep his leg elevated as much as  possible to avoid dependent edema.  The patient will be seen weekly by  the nurse for changing of the compression wrap and will be reevaluated  in 2 weeks by the physician.      Harold A. Tanda Rockers, M.D.  Electronically Signed     HAN/MEDQ  D:  11/13/2007  T:  11/13/2007  Job:  [id number]

## 2010-10-09 NOTE — Assessment & Plan Note (Signed)
[facility]   NAME:  ODIES, DESA NO.:  [id number]   MEDICAL RECORD NO.:  [id number]      DATE OF BIRTH:  1960-09-27   PHYSICIAN:  Maxwell Caul, M.D. VISIT DATE:  09/22/2008                                   OFFICE VISIT   [name] is a 50 year old man we have been following for an ulceration  on the medial malleolus of the left leg.  This is surrounded by chronic  erythema and inflammation of really uncertain etiology; however, this is  not infectious.  It has responded nicely to topical steroids and  eventual compression, and this is eventually closed over.   IMPRESSION:  Largely venous stasis ulceration.  This is completely  closed over.  We did give him a prescription for 20-30 below-knee graded  pressure stockings; however, the issue here is one of cost, they have  not been able to obtain these.  I talked to his caregiver.  As long as  he keeps his leg elevated and the swelling is down, they may not need to  wrap this.  However, if there is swelling, he could have an Ace wrap to  control this.  Right now, there is no open area.  There is certainly  edema in the left leg including around the left lateral malleolus, but I  did not press the issue of graded pressure stockings.  He is discharged.  His wound is healed.  Other than episodic Ace wraps, I do not think  anything further will be added at this point.           ______________________________  Maxwell Caul, M.D.     MGR/MEDQ  D:  09/22/2008  T:  09/23/2008  Job:  [id number]

## 2010-10-09 NOTE — Assessment & Plan Note (Signed)
[facility]   NAME:  PACE, LAMADRID NO.:  [id number]   MEDICAL RECORD NO.:  [id number]      DATE OF BIRTH:  [birth year]   PHYSICIAN:  Lenon Curt. Chilton Si, M.D.   VISIT DATE:  04/15/2008                                   OFFICE VISIT   HISTORY:  A 50 year old male who returns for inspection of a large wound  of the left leg medially apparently due to venous stasis disease with  ulceration.  The patient is doing well on current treatment of twice  weekly changes of silver alginate, Xtrasorb, and Profore Lite.  There is  progression in wound healing.  There has been no foul odor or drainage  or fevers.   Exam, wound measurement of the left medial lower extremity shows a wound  of 4 x 3 x shallow indeterminate depth.  There is granulation tissue  present is covered by soft slough.  There is healing of the wound from  the edges with new skin growth there.  There is mild tenderness with  manipulation.   TREATMENT:  Slight debridement with forceps was done to remove some scab  from the edges of the wounds and soft slough.  It was then covered with  silver alginate, Xtrasorb, Profore Lite, and A and B ointment was  applied around the wound.  This will be repeated twice weekly with home  health nurses and he will return to clinic again in 1 month.      Lenon Curt Chilton Si, M.D.  Electronically Signed     AGG/MEDQ  D:  04/15/2008  T:  04/16/2008  Job:  [id number]

## 2010-10-09 NOTE — Procedures (Signed)
NAME:  [name], [name] NO.:  [id number]   MEDICAL RECORD NO.:  [id number]          PATIENT TYPE:  REC   LOCATION:  [facility]                          FACILITY:  [facility]   PHYSICIAN:  [name], M.D.DATE OF BIRTH:  Feb 24, 1961   DATE OF PROCEDURE:  11/24/2008  DATE OF DISCHARGE:                               OPERATIVE REPORT   Phenol neurolysis in left obturator nerve.   INDICATIONS:  Spastic tetraplegia due to CP with abductor spasticity  interfering with dressing and toileting.   Informed consent was obtained after describing risks and benefits of the  procedure with the patient.  These include bleeding, bruising, and  infection.  He elects to proceed and has given written consent.  The  patient was placed in a supine position on exam table.  External DC stim  applied with EMG stimulator.  Then, a 50-mm, 22-gauge needle electrode  inserted under E-Stim guidance adductor twitch obtained and confirmed.  A 5% phenol x4 mL injected.  The patient tolerated the procedure well.  Post-injection instructions given.  He has some upper extremity  spasticity, which would be response to Botox.  We will schedule in 1  month, 100 units in finger and wrist flexors.      [name], M.D.  Electronically Signed     AEK/MEDQ  D:  11/24/2008 12:04:06  T:  11/25/2008 02:10:55  Job:  [id number]

## 2010-10-09 NOTE — Assessment & Plan Note (Signed)
[facility]   NAME:  [name], [name] NO.:  [id number]   MEDICAL RECORD NO.:  [id number]      DATE OF BIRTH:  1961-01-07   PHYSICIAN:  Maxwell Caul, M.D.      VISIT DATE:                                   OFFICE VISIT   LOCATION:  [facility].   [name] is a gentleman who has been followed here chronically for a  stasis ulceration of the medial malleolar aspect of his left ankle.  This initially had a very significant cellulitis associated with it.  However, he also has chronic periwound inflammation of really uncertain  etiology.  He has recently been receiving silver alginate dressings with  ketoconazole and triamcinolone to the surrounding skin area.  This is  being changed by home health.   On examination, he is afebrile.  The wound itself has reduced in size  somewhat currently measuring 3.1 x 4.5 x 0.1 as opposed to 3.5 x 5 last  time.  Once again, this is debrided of some leathery red eschar.  He  does not tolerate this well.  Once again, there is significant periwound  inflammation or dermatitis of uncertain etiology.  I have only applied  TCA to this.   IMPRESSION:  Venous stasis with venous stasis ulceration.  Apparently,  he is not a candidate for an Apligraf because of the absence of visible  varicosities.  I have continued the silver alginate, SofSorb, bulky  dressing, Kerlix, and Coban, although I am really not sure we are making  much progress here.  I am also not completely certain of the etiology of  the periwound erythema/dermatitis.  The inflammation itself could be  inhibiting wound healing.  We will see him again in 2 weeks.  Home  health to continue.           ______________________________  Maxwell Caul, M.D.     MGR/MEDQ  D:  06/16/2008  T:  06/17/2008  Job:  [id number]

## 2010-10-09 NOTE — Assessment & Plan Note (Signed)
[facility]   NAME:  [name], [name] NO.:  [id number]   MEDICAL RECORD NO.:  [id number]      DATE OF BIRTH:  1960/09/14   PHYSICIAN:  Leonie Man, M.D.    VISIT DATE:  01/03/2009                                   OFFICE VISIT   PROBLEM:  Recurrent left medial ankle wound in this 50 year old man with  history of cerebral palsy.  He is here today with his caregiver.  When  seen last the wound was treated with Silvadene cream, 4x4s and a Kerlix  gauze and was changed on a daily basis.  The patient returns today for  followup.   EXAMINATION:  The patient is afebrile.  Temperature is 97.4, pulse 106,  respirations 22, blood pressure is 131/84.  The wound measures 1.8 x 0.9  x 0.1 cm.  The wound base is clean.  There is minimal drainage.  The  surrounding skin is normal.  There is no odor.   TREATMENT:  Today prisma followed by a Profore Lite dressing.   DISPOSITION:  Follow up 1 week.      Leonie Man, M.D.  Electronically Signed     PB/MEDQ  D:  01/03/2009  T:  01/04/2009  Job:  [id number]

## 2010-10-09 NOTE — Assessment & Plan Note (Signed)
[facility]   NAME:  [name], [name] NO.:  [id number]   MEDICAL RECORD NO.:  [id number]      DATE OF BIRTH:  May 29, 1960   PHYSICIAN:  Theresia Majors. Tanda Rockers, M.D. VISIT DATE:  10/29/2007                                   OFFICE VISIT   SUBJECTIVE:  [name] is a 50 year old man who we have following for a  pressure ulcer on the left medial lower extremity.  He has also been  treated for a fungal nail infection.  In the interim, he has used  ketoconazole cream between his digits, and he has had a course of  doxycycline.  We have dressed his medial wound with a Silverlon dressing  and a bulky wrap.  There has been no interim fever or increased  drainage.  His attendant feels that his wound has significantly  improved.   OBJECTIVE:  Blood pressure is 140/80, respirations 16, pulse rate 87,  and temperature is 98.4.  Inspection of the lower extremity shows that  wound #1 completely is a 100% granulated with evidence of advancing  epithelium from the periphery.  There is no drainage.  There is no  hyperemia or evidence of infection.  The pedal pulse remains palpable.  The areas previously described as being macerated and overgrown with  fungi, appear to be more normal.  They are dry.  There is no increase of  drainage or erythema.   ASSESSMENT:  Clinical improvement.   PLAN:  We will continue the padded Profore Lite dressing with hydrogel.  We will continue the ketoconazole in the digit, 2 applications.  We will  reevaluate the patient in 2 weeks p.r.n.      Harold A. Tanda Rockers, M.D.  Electronically Signed     HAN/MEDQ  D:  10/29/2007  T:  10/30/2007  Job:  [id number]

## 2010-10-09 NOTE — Assessment & Plan Note (Signed)
[facility]   NAME:  [name], [name] NO.:  [id number]   MEDICAL RECORD NO.:  [id number]      DATE OF BIRTH:  02/22/1961   PHYSICIAN:  Maxwell Caul, M.D.      VISIT DATE:                                   OFFICE VISIT   LOCATION:  [facility].   [name] is a gentleman that we have been following for a severe  predominantly stasis ulceration of the medial malleolar aspect of his  left ankle.  This initially apparently had a very severe cellulitis  associated with it; however, he also has chronic periwound inflammation  of really uncertain etiology.  He has been receiving silver alginate  dressings with ketoconazole and triamcinolone to the surrounding skin  area.  This is being changed by home health 3 times a week.   On examination, he is afebrile.  This is the first time I have seen this  wound in quite some time.  It measured 3.5 x 5 x 0.1.  It underwent a  difficult, nonexcisional debridement of some very adherent eschar.  The  degree of periwound erythema initially made me think that this was  infected.  However, looking back on pictures and discussing with the  staff, it appears that the same periwound erythema that we have been  treating as potentially fungal or potentially an alternative dermatitis.  In any case, after debridement, the wound bed looked fairly clean.  We  reapplied TCA to the periwound erythema.  We applied silver alginate,  SofSorb, bulky wrap, and Kerlix and Coban under his PRAFO boot.  I have  asked for prior approval for an Apligraf which we will hopefully place  in the next week or 2 to see if we can stimulate some degree of healing  here.           ______________________________  Maxwell Caul, M.D.     MGR/MEDQ  D:  06/02/2008  T:  06/03/2008  Job:  [id number]

## 2010-10-09 NOTE — Procedures (Signed)
NAME:  [name], [name] NO.:  [id number]   MEDICAL RECORD NO.:  [id number]          PATIENT TYPE:  REC   LOCATION:  [facility]                          FACILITY:  [facility]   PHYSICIAN:  [name], M.D.DATE OF BIRTH:  October 30, 1960   DATE OF PROCEDURE:  10/13/2007  DATE OF DISCHARGE:                               OPERATIVE REPORT   DIAGNOSIS:  Quadriplegic cerebral palsy, 343.2.   PROCEDURE:  Bilateral obturator nerve block with phenol.   INDICATION:  Hip abductor spasticity interfering with ADLs.   Informed consent was obtained after describing the risks and benefits  with the patient as well as one of the supervisors from his assisted  living group home.   DESCRIPTION OF PROCEDURE:  He elects to proceed and has given written  consent.  The patient placed supine on exam table.  External DC stim  using EMG stimulator obtained abductor twitch bilaterally.  Area was  marked, prepped with Betadine and alcohol, then entered with a 22 gauge  50 mm needle electrode under e-stim guidance.  Adductor twitch obtained,  confirmed at 1 mA and then 4 mL of 5% phenol injected first in the right  side.  This same procedure was repeated on the left side using same  needle, injectate and technique.  The patient tolerated procedure well.  Post injection instructions given.  Recheck in 1 month.      [name], M.D.  Electronically Signed     AEK/MEDQ  D:  10/13/2007 16:47:08  T:  10/13/2007 17:43:06  Job:  [id number]

## 2010-10-09 NOTE — Assessment & Plan Note (Signed)
[name] last saw me January 05, 2008.  On Oct 13, 2007, I did a  bilateral obturator nerve block with phenol.  He had some relief with  his adductor spasticity.  He did have increased antispasticity effect  with the increase of his baclofen dosage from 5 mg to 10 mg b.i.d.  He  has had no signs of sedation.   The patient has had a lower extremity infection now treated with  antibiotics.  He continues to work 6 hours a week, supported employment,  Surveyor, minerals.  This is at a [employer].  His activities of  daily living; he is able to dress himself upper body, lower body has  difficulty, sometimes needs help with this.  He is mainly wheelchair  bound.   SOCIAL HISTORY:  Lives at [facility].   PHYSICAL EXAMINATION:  GENERAL:  In no acute distress.  Mood and affect  appropriate.  He does have some vocal modulation problems in terms of  vocal volume.  His orientation is x3.  Gait is nonambulatory.  EXTREMITIES:  Lower extremities, does have some drainage around the Unna  boot area, looks dried, foul smell.  No erythema in the toes or in the  proximal leg.  He has increased tone in the hip adductors rated as  Ashworth III.  He has essentially no lower extremity strength.   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  At this point, we will need to reblock with  phenol then consider upper extremity pectoralis tone as well as wrist  flexor tone.   I will see him back in about a month for the injection.  We will his  continue baclofen 10 mg b.i.d.      [name], M.D.  Electronically Signed     AEK/MedQ  D:  03/29/2008 13:45:42  T:  03/30/2008 02:11:18  Job #:  [id number]   cc:   Angeline Slim, M.D.

## 2010-10-09 NOTE — Assessment & Plan Note (Signed)
[name] last saw me on November 10, 2007.  On Oct 13, 2007, I did  bilateral obturator nerve block with phenol.  His caregiver as well as  he himself states that his lower extremity stiffness is better.  In  addition, last visit we did bump up his baclofen to 10 b.i.d. and this  also has helped without signs of sedation.   He has no pain.  His sleep is good.  He needs assist with all self-care  mobility.  He is employed 6 hours a week, folding pizza boxes.   PHYSICAL EXAMINATION:  VITAL SIGNS:  His blood pressure is 149/84, pulse  91, respiratory rate 18, and O2 sat 98% on room air.  GENERAL:  No acute distress.  Affect is alert, non-ambulatory.  EXTREMITIES:  Without upper extremity edema or right lower extremity  edema.  He does have an Unna boot on the left lower extremity, seen in  wound care for skin breakdown.   His upper extremity strength is 4- biceps, triceps, grip, but all  influenced by increased tone primarily in the flexors.  In the lower  extremity, he has essentially 0/5 strength.  He does have limited range  of motion and hip abduction but certainly less tone than prior.  His  Ashworth is down to about 2 from 3.   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  As I discussed with the patient's  caregiver usual duration of effect is 3 to 6 months, currently at 3  months.  We will see him back in another 3 months and see if the  injection is wearing off at that time.   I have given another prescription for baclofen 10 mg p.o. b.i.d. with 2  refills.      [name], M.D.  Electronically Signed     AEK/MedQ  D:  01/05/2008 08:45:06  T:  01/05/2008 23:35:22  Job #:  [id number]   cc:   Angeline Slim, M.D.

## 2010-10-09 NOTE — Assessment & Plan Note (Signed)
[facility]   NAME:  COALTON, ARCH NO.:  [id number]   MEDICAL RECORD NO.:  [id number]      DATE OF BIRTH:  10/21/1960   PHYSICIAN:  Maxwell Caul, M.D. VISIT DATE:  07/28/2008                                   OFFICE VISIT   [name] is a gentleman that we are following for a ulceration on the  medial malleolar area.  This had tremendous surrounding erythema at one  point; however, we have been using TCA on the erythema and although the  cause of the erythema was never clear, it has been improving.  To the  actual wound, he has been receiving silver alginate, SofSorb, and a  bulky wrap dressing.  The patient returns today in followup.   On examination, he is afebrile with a temperature of 97.8.  The wound  area measures 2.7 x 3.2 x 0.2.  It appears to be making good progress.  We did a light surface debridement with wound cleanser and gauze.  We  did not need to do sharp debridement.  The wound bed looks healthy.   IMPRESSION:  Largely venous stasis ulceration:  We have continued with  silver alginate, (advanced home care) SofSorb, bulky dressing, Kerlix,  and Coban.  I did think we are definitely making progress.  We will see  him again in a week.           ______________________________  Maxwell Caul, M.D.     MGR/MEDQ  D:  07/28/2008  T:  07/29/2008  Job:  [id number]

## 2010-10-09 NOTE — Discharge Summary (Signed)
NAME:  [name], MESTRE NO.:  [id number]   MEDICAL RECORD NO.:  [id number]          PATIENT TYPE:  REC   LOCATION:  FOOT                         FACILITY:  [facility]   PHYSICIAN:  Barry Dienes. Eloise Harman, M.D.DATE OF BIRTH:  [birth year]   DATE OF ADMISSION:  01/01/2008  DATE OF DISCHARGE:                               DISCHARGE SUMMARY   SUBJECTIVE:  The patient is a 50 year old Caucasian man from a chronic  care facility who was seen for reevaluation of a left inframalleolar  venous stasis ulcer.  He had been treated with a Silverlon pad, a zinc  barrier cream, and a Profore Lite wrap.  He has been seen weekly by the  nursing staff and is seen for physician followup.  He has not had  significant pain in the area, has not had fever or chills.   OBJECTIVE:  VITAL SIGNS:  Blood pressure 133/79, pulse 85, respirations  18, temperature 97.8.  SKIN:  There is a superficial wound on the medial aspect of the left leg  overlying the medial malleolus that currently measures 2.5 cm x 3 cm x  0.2 cm in depth.  The wound does not have eschar and has moderate  necrotic tissue with a yellow and red wound base and some granulation  tissue.  There is no exposed bone or muscle or tendon and no foul odor.  There is a moderate amount of serosanguineous drainage, and the  periwound area is intact with mild erythema.   IMPRESSION:  Healing left lower extremity stasis ulcer.   PLAN:  After topical anesthesia was obtained with EMLA cream, a #15  scalpel was used to sharply debride the surface area of the ulcer, which  had a small amount of necrotic tissue.  The patient tolerated this well.  The wound will be covered with Silverlon, periwound zinc barrier cream,  and a SofSorb Profore Lite dressing.  He will be seen weekly by the  nursing staff and have a physician followup visit in approximately 4  weeks.           ______________________________  Barry Dienes Eloise Harman, M.D.     DGP/MEDQ   D:  01/26/2008  T:  01/26/2008  Job:  [id number]

## 2010-10-09 NOTE — Assessment & Plan Note (Signed)
HISTORY OF PRESENT ILLNESS:  The patient was last seen by on Oct 13, 2007, at which time, I did bilateral obturator nerve block with phenol.  Both the patient and his caregiver at the group home state that his  stiffness is better, particularly in the mornings when he is doing  dressing activities.  He still does have some adductor spasticity in  bilateral lower extremities.  He uses a wheelchair and needs help with  transfers.   PHYSICAL EXAMINATION:  VITAL SIGNS:  His blood pressure is 133/74, pulse  81, respiratory rate 18, and O2 sat 99% on room air.  GENERAL:  Well-developed and well-nourished male, sitting in a  wheelchair.  He has atrophic lower extremities with increased adductor  tone; however, I am able to get his legs apart more easily then prior.   He does have some hamstring spasticity as well.   IMPRESSION:  Spastic quadriplegia, particular problem with abductor  spasticity, a bit improved after obturator block with phenol.   PLAN:  We will bump up his baclofen from 5 mg t.i.d. to 10 mg b.i.d.  I  will see him back in a couple of months, at which time, we will  reevaluate for possible reinjection of phenol versus Botox to the  abductors.      [name], M.D.  Electronically Signed     AEK/MedQ  D:  11/10/2007 17:29:47  T:  11/11/2007 10:30:26  Job #:  [id number]

## 2010-10-09 NOTE — Procedures (Signed)
NAME:  SHAWNTEZ, DICKISON NO.:  [id number]   MEDICAL RECORD NO.:  [id number]          PATIENT TYPE:  REC   LOCATION:  [facility]PC                          FACILITY:  [facility]   PHYSICIAN:  [name], M.D.DATE OF BIRTH:  03-21-1961   DATE OF PROCEDURE:  04/25/2008  DATE OF DISCHARGE:                               OPERATIVE REPORT   Mr. Robicheaux returns today.  He underwent bilateral obturator nerve block  with phenol performed on Oct 13, 2007.  He was starting to have some  increasing hip abductor spasticity once again, and therefore was  scheduled for reinjection.  His spasticity does interfere with self-  care.   His spasticity has only partially responded to baclofen.   Informed consent was obtained.  After describing risks and benefits of  the procedure with the patient these include bleeding, bruising, and  infection, he elects to proceed and has given written consent.  The  patient in the supine position, external DC stim applied using EMG  stimulator.  Abductor twitch obtained.  Area marked and prepped with  Betadine and entered with a 22-gauge 15-mm needle electrode under [employer].  First the right obturator nerve was identified using adductor  twitch confirmed at 0.8 mA.  Then, a solution containing 5% phenol x4 mL  was injected.  The same procedure was done on the left side.  The  patient tolerated the procedure well.  Pre and post injection  instructions were given to the patient's caregiver, who is a Financial controller at  [employer].      [name], M.D.  Electronically Signed     AEK/MEDQ  D:  04/25/2008 16:11:31  T:  04/26/2008 05:45:27  Job:  [id number]

## 2010-10-09 NOTE — Assessment & Plan Note (Signed)
[facility]   NAME:  CALDWELL, KRONENBERGER NO.:  [id number]   MEDICAL RECORD NO.:  [id number]      DATE OF BIRTH:  1960-11-15   PHYSICIAN:  Barry Dienes. Eloise Harman, M.D.     VISIT DATE:                                   OFFICE VISIT   SUBJECTIVE:  The patient is a 50 year old Caucasian man from a long-term  care facility with cognitive deficits here for reevaluation of a left  heel and foot ulcer.  This ulcer has been treated with Hydrofera Blue, a  periwound barrier and Profore Light changed once weekly.  He has had  mild pain in the area, but no fever or chills.  He has been eating well  and trying to keep pressure off the area.   OBJECTIVE:  VITAL SIGNS:  Blood pressure 132/84, pulse 86, respirations  16, and temperature 98.4.   The condition of the wound is as follows:  Wound #1 is located on the  medial left foot and measures 5.4 cm x 6.5 cm x 0.1 cm.  There is not  significant eschar but there is an extensive amount of necrotic tissue  covering the wound.  The wound base is red in color with red granulation  tissue and no exposed bone, tendon, or muscle.  There is a strong odor  to the wound and a large amount of serosanguineous discharge.  There is  erythema and maceration surrounding the wound.   ASSESSMENT:  Interval worsening of left foot venostasis ulcer with  decrement in condition of the surrounding tissue due to a large amount  of drainage from the wound.   PLAN:  After the wound had topical anesthesia obtained via 5% lidocaine  gel.  A #15 scalpel was used to do selective debridement of the necrotic  tissue on the ulcer.  This was not associated with significant bleeding  or pain.  The wound was then covered with a silver alginate dressing  covered by an ABD pad and a Profore light wrap.  Vitamin A and D of  ointment was applied around the wound to protect this area.  We have  increased the frequency of the dressing changes to twice  weekly by the  nursing staff and a physician reevaluation in approximately 3 weeks.           ______________________________  Barry Dienes Eloise Harman, M.D.     DGP/MEDQ  D:  03/15/2008  T:  03/16/2008  Job:  [id number]

## 2010-10-09 NOTE — Assessment & Plan Note (Signed)
[facility]   NAME:  [name], VANATTA NO.:  [id number]   MEDICAL RECORD NO.:  [id number]      DATE OF BIRTH:  [birth year]   PHYSICIAN:  Leonie Man, M.D.    VISIT DATE:  12/20/2008                                   OFFICE VISIT   PROBLEM:  Recurrent left medial ankle wound in this 50 year old man with  history of cerebral palsy.  The patient has [facility] for a similar problem, but discharged on around April  of this year.  The wound appears largely to be a venous stasis  ulceration, although there is no associated leg edema, and this wound  actually is below the malleolus.  This is a partial-thickness wound in  this gentleman, and he is brought in today by a caregiver for evaluation  and treatment.   There is no inflammation that has been forwarded to [place] from the assisted-  living center specifically there are no current medication lists or  other historical information in this gentleman with cerebral palsy and  mental slowing.   On examination today, the left medial ankle wound measures 2.8 x 1.6 x  0.2 cm.  There is no edema.  No drainage.  No odor.  The wound is  treated today with Silvadene cream and a bulky 4x4 gauze dressing.  This  is scheduled to be changed every other day by the visiting nurse service  that visits at the center.  The patient is to return in 1 week for  further evaluation.      Leonie Man, M.D.  Electronically Signed     PB/MEDQ  D:  12/20/2008  T:  12/21/2008  Job:  [id number]

## 2010-10-09 NOTE — Assessment & Plan Note (Signed)
[facility]   NAME:  [name], [name] NO.:  [id number]   MEDICAL RECORD NO.:  [id number]      DATE OF BIRTH:  12-Nov-1960   PHYSICIAN:  Barry Dienes. Eloise Harman, M.D.     VISIT DATE:                                   OFFICE VISIT   ADDENDUM   CORRECTION:  The left foot venous stasis ulcer will be treated with  silver alginate covered by XSORB, and covered by a Profore Lite wrap.  The area around the ulcer was treated with vitamin A and E ointment.  These wraps will be changed twice weekly.  He will have a physician  reevaluation in approximately 2 weeks.  In addition, a culture of the  ulcer base was obtained.           ______________________________  Barry Dienes. Eloise Harman, M.D.     DGP/MEDQ  D:  03/15/2008  T:  03/16/2008  Job:  [id number]

## 2010-10-09 NOTE — Assessment & Plan Note (Signed)
[facility]   NAME:  [name], [name] NO.:  [id number]   MEDICAL RECORD NO.:  [id number]      DATE OF BIRTH:  1960/11/13   PHYSICIAN:  Ardath Sax, M.D.           VISIT DATE:                                   OFFICE VISIT   This patient returns; and he has been getting Prisma, hydrogel, and  Profore Lite dressings on a venous stasis ulcer on his left ankle.  It  looks about the same.  I did curette off a little bit of, I guess you  just call it necrotic tissue and there is nice pink base.  It did not  look like it was infected, but the nurse said when she changed the  dressing, it had a bad odor.  I went ahead after washing it and  scrubbing it up, we put on a new Prisma, hydrogel, and a Profore Lite  dressing.  He is on doxycycline as there is a fair amount of cellulitis  surrounding the ulcer and a little bit of swelling.  The attendant with  him told him that he would keep elevating it.      Ardath Sax, M.D.     PP/MEDQ  D:  01/19/2009  T:  01/20/2009  Job:  [id number]

## 2010-10-09 NOTE — Assessment & Plan Note (Signed)
[facility]   NAME:  [name], [name] NO.:  [id number]   MEDICAL RECORD NO.:  [id number]      DATE OF BIRTH:  Oct 05, 1960   PHYSICIAN:  Leonie Man, M.D.    VISIT DATE:  07/14/2008                                   OFFICE VISIT   PROBLEM:  Venous stasis disease in this 50 year old man with a history  of cerebral palsy here today with an attendant.  The patient originally  had a left medial malleolar ulcer with surrounding erythema measuring  3.4 x 4.2 x 0.1 cm.  He has been treated in the past with a TCA for the  erythematous areas around the wound and then with silver alginate and  SofSorb and a bulky wrap dressing.   The patient returns today for followup.   He is alert but relatively subdued.  His vital signs were within normal  limits and he is able to answer questions when asked.  The current wound  dimensions are 1.5 x 4.2 x 0.2 cm.  There is no drainage.  There is no  odor.  The patient was pretreated with LET and selective debridement  carried out on this wound which had a light film of fibrinous exudate.  We will continue him on TCA with silver alginate, SofSorb, Kerlix and  Coban dressing and follow up with him in 1 week.  He is to see the Home  Health three times weekly for dressing changes.      Leonie Man, M.D.  Electronically Signed     PB/MEDQ  D:  07/14/2008  T:  07/15/2008  Job:  [id number]

## 2010-10-09 NOTE — Assessment & Plan Note (Signed)
[facility]   NAME:  [name], [name] NO.:  [id number]   MEDICAL RECORD NO.:  [id number]      DATE OF BIRTH:  10-06-1960   PHYSICIAN:  Maxwell Caul, M.D.      VISIT DATE:                                   OFFICE VISIT   [name] is a 50 year old man who we have been following for an  ulceration on the medial malleolus of the left leg.  This is surrounded  by chronic erythema and inflammation of really uncertain etiology.  It  has, however, responded nicely to topical steroids and the wound is  eventually closed over.   On examination, the temperature is 97.9.  The wound over the left medial  malleolus is totally closed.  There is surrounding erythema and some  degree of inflammation.  As mentioned, I am not completely certain about  the etiology here, but it is certainly not infectious.   IMPRESSION:  Largely venous stasis ulceration.  We did wrap him again in  Kerlix and Coban, gave them a prescription for 20-30 mm below-knee  graded-pressure stockings.  I do not have a good feeling about whether  we are going to be able to keep this area in the healed state, however,  without graded-pressure stockings there would be no chance.  I discussed  this in detail with his attendant who came with him today.  I will see  him again in 2 weeks hopefully with his stockings in followup.  After  that, he can be discharged.           ______________________________  Maxwell Caul, M.D.     MGR/MEDQ  D:  09/08/2008  T:  09/09/2008  Job:  [id number]

## 2010-10-09 NOTE — Consult Note (Signed)
NAME:  [name], [name] NO.:  [id number]   MEDICAL RECORD NO.:  [id number]          PATIENT TYPE:  REC   LOCATION:  FOOT                         FACILITY:  [facility]   PHYSICIAN:  Jonelle Sports. Sevier, M.D. DATE OF BIRTH:  25-Jul-1960   DATE OF CONSULTATION:  10/07/2007  DATE OF DISCHARGE:                                 CONSULTATION   HISTORY:  This 50 year old white male is referred from the [facility] for assistance with management of a pressure ulceration  on the right medial malleolar area.   The patient has a complex medical history centered around cerebral palsy  which apparently has been present since birth and he currently resides  in a group home, wheelchair bound.  He has some spasticity and  deformities which apparently have led to this area receiving great deal  of pressure when he rests that leg.   Several weeks ago, he developed an open ulceration in that area and has  been managed at the [facility] with Unna wraps and also with  doxycycline 100 mg b.i.d.  It is unclear to me whether this was based on  actual culture reports and that we have no report of those at this time.   Whatever, he has made some progress but has not healed and is  accordingly sent here for further evaluation and advice.   Past medical history and all other history is available in very limited  degree and the attendant with the patient is not aware of any more  detail nor is the patient able himself to give [place] much information.  Apparently, he has had prior operations involving some tendon releases  or things of that nature in an effort to help with his spasticity but  the nature of those is unknown.  He has had no internal surgeries of  which we are aware and he has had no recent at least hospitalizations  for significant medical problems.  He has no known chronic medical  diseases.   ALLERGIES:  He is said to be allergic to TAPE which causes a  rash.   MEDICATIONS:  1. Fish oil 1000 mg 2 tablets b.i.d.  2. Multivitamins 1 tablet daily.  3. Doxycycline 100 mg b.i.d.  4. Prevacid 30 mg daily.  5. Hydrocortisone, ketoconazole, and Retin-A creams that are used to      some skin problems on his face and trunk.   REVIEW OF SYSTEMS:  Impossible.  It is noted of course that he has the  fairly extensive skin problems as indicated above.   PHYSICAL EXAMINATION:  Blood pressure is 138/93, pulse is 90 and  regular, respirations 16, temperature is 98.0.  The patient is alert and  attempts to be cooperative but is not to effectively communicative.  He  does have obvious spastic cerebral palsy with multiple deformities.   The left lower extremity is held in permanent plantar flexion at the  ankle and the toes are flexed as well.  There is evidence for extensive  tinea infection involving the plantar and lateral aspects of the  foot  and extending up onto the ankle where I think it is contributory to some  a periwound erythema that we are currently seeing.   The wound itself on the medial aspect of the left lower extremity in the  malleolar area measures 1.4 x 1.1 x 0.1 cm and has a slightly  hypergranular base.  There is extensive surrounding erythema which as I  indicated before is likely due to fungal infection in that area as well.  There is no apparent tenderness, no streaking.  There is some edema of  the extremity which extends down.  The pulses in that extremity are  palpable and appear adequate.  There is no obvious venous insufficiency.   IMPRESSION:  1. Pressure ulcer, left medial malleolar area.  2. Extensive fungal dermatitis, left foot and ankle.   DISPOSITION:  1. The wound is cautiously selectively debrided off the      hypergranulation tissue using an easy scrape with a scalpel.  This      apparently is slightly painful to the patient but does not cause      extreme reaction.  Bleeding was then controlled with  pressure and      the area was then dressed with an application of a Silverlon pad      and the area of the wound itself in the plantar aspect of foot is      covered with ABD dressings because of some tendency toward weeping      there, likely due to the fungal infection.  The entire area is      covered with ketoconazole prior to the application of the dressing.      The wound is then placed in a Profore Lite wrap which will be      carried up just distal to the popliteus.   The patient will be seen at weekly intervals by the nurse here for  dressing change with replacement of similar dressing and the patient  will be seen again by the physician in 3 weeks or sooner if need be.   The facility is instructed to reduce the patient's doxycycline to 100 mg  daily and to continue that until further order.  There are likewise  instructed to try to avoid pressure on this area as much as possible and  to keep the dressing clean and dry.           ______________________________  Jonelle Sports. Cheryll Cockayne, M.D.     RES/MEDQ  D:  10/07/2007  T:  10/08/2007  Job:  [id number]

## 2010-10-09 NOTE — Assessment & Plan Note (Signed)
[facility]   NAME:  BOGDAN, VIVONA NO.:  [id number]   MEDICAL RECORD NO.:  [id number]      DATE OF BIRTH:  03-Jan-1961   PHYSICIAN:  Maxwell Caul, M.D.      VISIT DATE:                                   OFFICE VISIT   Mr. Wilmeth is a gentleman we had earlier in this year.  He has been  managed over the last month for a wound on the recurrent wound on the  left ankle, although this is not as severe as the previous one was.  He  has been receiving Prisma, hydrogel under a Profore Lite wrap.   On examination, the wound measures 0.7 x 0.9 x 0.1.  It had a healthy  granulation, but no epithelialization.  There is no evidence of  infection.  Nothing needed to be debrided.   We applied Puracol, Mepitel under a Profore Lite wrap.  We will see him  again in a week.           ______________________________  Maxwell Caul, M.D.     MGR/MEDQ  D:  01/26/2009  T:  01/27/2009  Job:  [id number]

## 2010-10-09 NOTE — Assessment & Plan Note (Signed)
[facility]   NAME:  [name], [name] NO.:  [id number]   MEDICAL RECORD NO.:  [id number]      DATE OF BIRTH:  [birth year]   PHYSICIAN:  Maxwell Caul, M.D. VISIT DATE:  12/03/2007                                   OFFICE VISIT   [name] as a gentleman that we have been following for a pressure  ulcer associated with the medial aspect of his left lower extremity.  In  the interim, he has had continued swelling.   On examination, temperature is normal.  He continues to have significant  edema and erythema over that area of the foot.  The area is  epithelialized.  However, there is probably some eschar on the wound bed  that ultimately need to be debrided.  There is no particular tenderness.  There is no drainage to this.  His foot is very cool and I wondered  about his vascular supply.  I think it is also the same, however, in his  other foot.  Capillary refill continues to be adequate.   IMPRESSION:  Pressure ulcer with stasis physiology, uncertain of his  arterial supply, although he seems to be tolerating Profore lite  compression.   PLAN:  I have continued with the Profore lite and an ABD pad underneath  this over this area.  I see no particular need for topical dressings at  this point.  We will see him again with a nurse in a week and 2 weeks by  the physician.     ______________________________  Maxwell Caul, M.D.    ______________________________  Maxwell Caul, M.D.    MGR/MEDQ  D:  12/03/2007  T:  12/04/2007  Job:  [id number]

## 2010-10-09 NOTE — Assessment & Plan Note (Signed)
[facility]   NAME:  [name], [name] NO.:  [id number]   MEDICAL RECORD NO.:  [id number]      DATE OF BIRTH:  1961/05/21   PHYSICIAN:  Leonie Man, M.D.         VISIT DATE:                                   OFFICE VISIT   PROBLEM:  Recurrent left medial ankle wound.   The patient is a 50 year old man with a history of cerebral palsy.  He  had been treated in the wound care center before for similar problem and  was discharge in approximately April 2010.  On his visit 1 week ago, the  patient's wound was treated with Silvadene cream, 4x4s and a Kerlix  gauze to be changed every other day.  However, on return today, his  caregiver notes that the wound dressings were not changed for the past  week.   PHYSICAL EXAMINATION:  GENERAL:  The patient is afebrile.  VITAL SIGNS:  Temperature 97.7, pulse 77, respirations 22, blood  pressure 133/81.  EXTREMITIES:  The wound dimensions are 2.0 x 1.0 x less than 0.1.  The  wound has got a light gray exudate without any odor or drainage.  I did  not attempt to debride this today because of the patient's reluctance,  we will go ahead and treat him with the Silvadene and Kerlix gauze  today.  Follow up with him in 1 week.      Leonie Man, M.D.  Electronically Signed     PB/MEDQ  D:  12/27/2008  T:  12/28/2008  Job:  [id number]

## 2010-10-09 NOTE — Assessment & Plan Note (Signed)
[facility]   NAME:  [name], [name] NO.:  [id number]   MEDICAL RECORD NO.:  [id number]      DATE OF BIRTH:  1961-02-12   PHYSICIAN:  Maxwell Caul, M.D.      VISIT DATE:                                   OFFICE VISIT   HISTORY OF PRESENT ILLNESS:  [name] is a gentleman we have been  following here for a difficult stasis ulceration on the medial malleolar  aspect of his left ankle.  This initially had a very significant  cellulitis associated with it.  However, there is also chronic periwound  inflammation here of really uncertain etiology.  We have been most  recently using silver alginate dressings with triamcinolone to the  surrounding skin.  This is being wrapped in Kerlix and Coban and being  changed 3 times a week by home health.   PHYSICAL EXAMINATION:  The wound itself measures 3.4 x 4.2 x 0.1.  This  is not considerably different than last time and however, generally the  wound looks healthier.  There is less debridement necessary and the  periwound inflammation is better.  We reapplied TCA to the surrounding  skin continued with a silver alginate.   IMPRESSION:  Venous stasis with venous stasis ulceration.  He is not an  Apligraf candidate as I understand things because of lack of visible  varicosities.  I have continued with silver alginate, SoftSorb, bulky  dressing, Kerlix, and Coban, and I think we are making some progress.  We will have to follow this carefully over in the next week or two.           ______________________________  Maxwell Caul, M.D.     MGR/MEDQ  D:  06/30/2008  T:  07/01/2008  Job:  [id number]

## 2010-10-09 NOTE — Assessment & Plan Note (Signed)
[facility]   NAME:  [name], [name] NO.:  [id number]   MEDICAL RECORD NO.:  [id number]      DATE OF BIRTH:  1961/03/12   PHYSICIAN:  Barry Dienes. Eloise Harman, M.D.     VISIT DATE:                                   OFFICE VISIT   SUBJECTIVE:  The patient is a 50 year old Caucasian man from a chronic  care facility who is seen for reevaluation of a left medial  inframalleolar venous stasis ulcer.  The wound has been treated with  Silverlon, periwound zinc barrier cream, and a SofSorb Profore Lite  dressing.  He has not had significant pain in the left foot.  His  attendant notes that he often times has pressure on the left heel once  sitting or when asleep at night.   OBJECTIVE:  VITAL SIGNS:  Blood pressure 144/88, pulse 86, respirations  20, and temperature 98.6.   The condition of his wound is as follows:  Along the medial aspect of  the distal left lower extremity overlying and just inferior to the  medial malleolus, there is a ulcer that measures 6.0 cm x 5.5 cm and  does not have significant depth.  There is no significant eschar.  There  is a moderate amount of yellow, red, beefy material with some necrotic  material and prominent hypergranulation tissue.  There is no exposed  bone, tendon, or muscle.  There is some maceration surrounding the  wound.  There is a large amount of serosanguineous and yellow discharge.  There is no foul odor.  The posterior aspect of the right heel is red in  color and somewhat spongy and the redness does not disappear with  applied pressure.   ASSESSMENT:  Stable left lower extremity venous stasis ulcer with a  moderate amount of maceration from significant drainage.  In addition,  there is a stage I heel decubitus ulcer.   PLAN:  The wound was washed with soap and water.  After topical  anesthesia was obtained with 5% lidocaine ointment, the wound was  debrided with a #15 scalpel without significant  bleeding or discomfort.  The ulcer will be cultured with Hydrofera blue and periwound petrolatum  barrier, covered by Profore Lite wrap.  The wound should be changed once  weekly.  This should be a nursing wound change dressing change in 1 week  and a physician for reevaluation in approximately 2 weeks.  In addition,  he was given a PRAFO boot that was applied correctly to the left foot.  It was recommended to his attendant that the [facility] boot be kept on all  times except with bathing.           ______________________________  Barry Dienes. Eloise Harman, M.D.     DGP/MEDQ  D:  03/01/2008  T:  03/02/2008  Job:  [id number]

## 2010-10-09 NOTE — Group Therapy Note (Signed)
Consultation requested by Dr. Irving Burton for the evaluation of  spasticity.   HISTORY:  The patient is a 50 year old male with cerebral palsy  primarily affecting lower extremities but with evidence of spastic  tetraplegia.  He recalls being able to ambulate when he was a child  using braces and crutches, but he has not been able to do so for many  years.  He has lived in a group home here in [place] since 1989,  where he receives assistance with ADLs and meals.  He is able to dress  his upper body without assistance; however, his lower body needs  assistance.  He sometimes stands and sometimes sits for dressing.  He  needs assistance for bathing and toileting.  His spasticity interferes  with bathing and toileting as well as dressing.  He has bladder control  problems and wears a diaper.  He needs assistance with meal prep,  household duties and shopping but is able to feed himself independently.  He also works 9 hours a week at [employer].   He has no significant pain.  He has spasms mainly in the lower  extremities but somewhat in the upper extremities.   PAST MEDICAL HISTORY:  1. Hyperlipidemia.  2. Gastric ulcer.  3. History of decubitus ulcer, left heel.  He has been seen by Dr.      Lajoyce Corners for that.  4. Acne.   MEDICATIONS:  1. Doxycycline.  2. Prevacid.  3. Fish oil.  4. Topical creams, including Retin A.   EQUIPMENT:  He has wheelchair with wheelchair cushion.  He does not use  crutches.   LABORATORY DATA:  He had a normal CBC approximately one year ago and  normal CMP one year ago.   PHYSICAL EXAMINATION:  Thin male, in no acute distress, in a wheelchair.  Kyphotic posture.  Thighs in an adducted position.  He has atrophy  bilateral lower extremities but normal musculature in the upper  extremities.  His range of motion is to 90 degrees bilateral shoulder  flexion and abduction.  No pain at end range.  He lacks 15 degrees of  extension at the right  elbow and 5 degrees on the left elbow.  He has  full range passively in the wrists.  In the hips, he has 3/4 range  external rotation, 1/4 range internal rotation.  Knee passive range of  motion is approximately 50% and lacking both full extension and full  flexion.  Ankle range of motion is 50%, lacking end range in both  plantar flexion and dorsiflexion.  Sensation is intact to touch  bilateral upper and lower extremities, although it appears that his  lower extremity sensation is better than his upper extremity.  His tone  is increased in all 4 extremities.  He has increased pectoralis tone,  increased wrist flexor tone in the uppers and in the lower extremities  has greatly increased hip adductor tone as well as increased knee  extensor tone.  His modified Ashworth is at a 3.   Coordination is reduced upper and lower extremities.  Deep tendon  reflexes are hyperactive, 3+ bilateral upper and lower extremities.  Extremities show no evidence of edema except at the feet.  He has good  pedal pulse on the right.  The left one has an Radio broadcast assistant on.  Neck range  of motion is good.   IMPRESSION:  1. 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  Should this fail,      would do Botox injection.  2. Start baclofen 5 mg b.i.d. for a week then increase to t.i.d.  May      have to go up to 10 mg should this not be effective.  3. In terms of his upper extremity spasticity, he may benefit from      Botox in the pectoralis as well as the wrist flexors.  Will first      treat the lower extremities.   Thank you for this interesting consultation.  I will keep you apprised  of his progress.      [name], M.D.  Electronically Signed     AEK/MedQ  D:  09/10/2007 13:23:49  T:  09/10/2007 14:06:49  Job #:  [id number]   cc:   Nadara Mustard, MD   Fax: [phone]

## 2010-10-18 ENCOUNTER — Ambulatory Visit (HOSPITAL_COMMUNITY)
Admission: RE | Admit: 2010-10-18 | Discharge: 2010-10-18 | Disposition: A | Discharge status: Home / Self Care | Payer: Medicare Other | Source: Ambulatory Visit | Attending: Internal Medicine | Admitting: Internal Medicine

## 2010-10-18 ENCOUNTER — Other Ambulatory Visit (HOSPITAL_BASED_OUTPATIENT_CLINIC_OR_DEPARTMENT_OTHER): Payer: Self-pay | Admitting: Internal Medicine

## 2010-10-18 DIAGNOSIS — M79609 Pain in unspecified limb: Secondary | ICD-10-CM | POA: Insufficient documentation

## 2010-10-18 DIAGNOSIS — M79673 Pain in unspecified foot: Secondary | ICD-10-CM

## 2010-10-18 NOTE — Telephone Encounter (Signed)
Rx for electric wheelchair placed up front in to be called bin to be mailed out.

## 2010-10-18 NOTE — Telephone Encounter (Signed)
Called [name] and she reports that it has been taken care of. Lorenda Hatchet, Renato Battles

## 2010-11-01 ENCOUNTER — Encounter (HOSPITAL_BASED_OUTPATIENT_CLINIC_OR_DEPARTMENT_OTHER): Payer: Medicare Other | Attending: Internal Medicine

## 2010-11-01 DIAGNOSIS — I872 Venous insufficiency (chronic) (peripheral): Secondary | ICD-10-CM | POA: Insufficient documentation

## 2010-11-01 DIAGNOSIS — Z79899 Other long term (current) drug therapy: Secondary | ICD-10-CM | POA: Insufficient documentation

## 2010-11-01 DIAGNOSIS — L97309 Non-pressure chronic ulcer of unspecified ankle with unspecified severity: Secondary | ICD-10-CM | POA: Insufficient documentation

## 2010-11-13 ENCOUNTER — Telehealth: Payer: Self-pay | Admitting: Family Medicine

## 2010-11-13 NOTE — Telephone Encounter (Signed)
Forward to [facility] for order.Busick, Rodena Medin

## 2010-11-13 NOTE — Telephone Encounter (Signed)
Needs referral faxed to them for eval. of motorized wheelchair - on the sched for 7/16 Fax to [phone]

## 2010-11-29 ENCOUNTER — Encounter (HOSPITAL_BASED_OUTPATIENT_CLINIC_OR_DEPARTMENT_OTHER): Payer: Medicare Other | Attending: Internal Medicine

## 2010-11-29 DIAGNOSIS — G809 Cerebral palsy, unspecified: Secondary | ICD-10-CM | POA: Insufficient documentation

## 2010-11-29 DIAGNOSIS — I872 Venous insufficiency (chronic) (peripheral): Secondary | ICD-10-CM | POA: Insufficient documentation

## 2010-11-29 DIAGNOSIS — L97309 Non-pressure chronic ulcer of unspecified ankle with unspecified severity: Secondary | ICD-10-CM | POA: Insufficient documentation

## 2010-12-11 ENCOUNTER — Ambulatory Visit: Payer: Medicare Other | Attending: Family Medicine | Admitting: Physical Therapy

## 2010-12-11 DIAGNOSIS — IMO0001 Reserved for inherently not codable concepts without codable children: Secondary | ICD-10-CM | POA: Insufficient documentation

## 2010-12-11 DIAGNOSIS — G809 Cerebral palsy, unspecified: Secondary | ICD-10-CM | POA: Insufficient documentation

## 2010-12-11 DIAGNOSIS — R293 Abnormal posture: Secondary | ICD-10-CM | POA: Insufficient documentation

## 2010-12-21 ENCOUNTER — Ambulatory Visit (INDEPENDENT_AMBULATORY_CARE_PROVIDER_SITE_OTHER): Payer: Medicare Other | Admitting: Family Medicine

## 2010-12-21 VITALS — BP 116/79 | HR 86 | Temp 98.4°F

## 2010-12-21 DIAGNOSIS — L97909 Non-pressure chronic ulcer of unspecified part of unspecified lower leg with unspecified severity: Secondary | ICD-10-CM | POA: Insufficient documentation

## 2010-12-21 DIAGNOSIS — L0390 Cellulitis, unspecified: Secondary | ICD-10-CM | POA: Insufficient documentation

## 2010-12-21 DIAGNOSIS — L0291 Cutaneous abscess, unspecified: Secondary | ICD-10-CM

## 2010-12-21 MED ORDER — CEFTRIAXONE SODIUM 1 G IJ SOLR
1.0000 g | Freq: Once | INTRAMUSCULAR | Status: AC
  Administered 2010-12-21: 1 g via INTRAMUSCULAR

## 2010-12-21 MED ORDER — SULFAMETHOXAZOLE-TMP DS 800-160 MG PO TABS: ORAL_TABLET | ORAL | Status: DC

## 2010-12-21 NOTE — Assessment & Plan Note (Signed)
Above medial malleolus, likely entry point for infection/cellulitis.  Profore-Lite compression wrap applied.  Started on double dose, double strength Bactrim.  Group home could not get antibiotics today so gave Rocephin 1 GM in office, as it was Friday afternoon.

## 2010-12-21 NOTE — Progress Notes (Signed)
  Subjective:    Patient ID: [name], male    DOB: November 29, 1960, 50 y.o.   MRN: [id number]  HPI  Patient of group home here with care giver for left leg edema, redness, and ulceration.  Has been followed by wound clinic, caregiver thought that he was discharged but [facility] says that he has an apt on 8/2.    Denies fever or chills.  Incontinence not improving with Detrol, started by Dr. Ashley Royalty.  This seems to be a problem at the group home, as he use to use a urinal and now is requiring frequent changes.   Behavioral problems worsening over the past year.  Mental Health has changed meds.  Caregiver worried about a VP shunt placed when he was age 47.  He is seeing neuro rehab for work on self transfers with UE.  He is non ambulatory .  Review of Systems  Constitutional: Negative for fever and chills.  Skin: Positive for wound.  Psychiatric/Behavioral: Positive for behavioral problems.       Objective:   Physical Exam  Constitutional:       Middle aged patient with CP, alert, frail appearing.  Skin:       Bilateral leg edema, left greater than right.  Right leg red, and hot to the knee. Malleolar (right medial) ulceration, aprox 3 cm in diameter and non painful.           Assessment & Plan:   No problem-specific assessment & plan notes found for this encounter.

## 2010-12-21 NOTE — Patient Instructions (Signed)
Follow up apt in one week with Ashley Royalty or Humberto Seals Received an injection of Rocephin (antibiotic in the office) Begin Bactrim DS 2 tabs twice daily for 2 weeks Drink a lot of water Use the compression stockings on the right foot

## 2010-12-24 ENCOUNTER — Telehealth: Payer: Self-pay | Admitting: Family Medicine

## 2010-12-24 NOTE — Telephone Encounter (Signed)
Is calling responding to a message about possibly canceling the appt. Or this Friday. They did not know who it was that was calling.  Please call them back.

## 2010-12-24 NOTE — Telephone Encounter (Signed)
Called. Pt was discharged from [facility] last Thursday. Advised to keep appt with S.Saxon and find out about the upcoming appt at the  Wound Ctr. They will call back tomorrow. Lorenda Hatchet, Renato Battles

## 2010-12-27 ENCOUNTER — Encounter (HOSPITAL_BASED_OUTPATIENT_CLINIC_OR_DEPARTMENT_OTHER): Payer: Medicare Other | Attending: Internal Medicine

## 2010-12-27 DIAGNOSIS — I872 Venous insufficiency (chronic) (peripheral): Secondary | ICD-10-CM | POA: Insufficient documentation

## 2010-12-27 DIAGNOSIS — L97309 Non-pressure chronic ulcer of unspecified ankle with unspecified severity: Secondary | ICD-10-CM | POA: Insufficient documentation

## 2010-12-27 DIAGNOSIS — G809 Cerebral palsy, unspecified: Secondary | ICD-10-CM | POA: Insufficient documentation

## 2010-12-28 ENCOUNTER — Ambulatory Visit: Payer: Medicare Other | Admitting: Family Medicine

## 2010-12-31 ENCOUNTER — Encounter: Payer: Self-pay | Admitting: Family Medicine

## 2010-12-31 ENCOUNTER — Ambulatory Visit: Payer: Medicare Other | Admitting: Family Medicine

## 2010-12-31 ENCOUNTER — Ambulatory Visit (INDEPENDENT_AMBULATORY_CARE_PROVIDER_SITE_OTHER): Payer: Medicare Other | Admitting: Family Medicine

## 2010-12-31 VITALS — BP 127/90 | HR 82 | Temp 97.8°F

## 2010-12-31 DIAGNOSIS — L97909 Non-pressure chronic ulcer of unspecified part of unspecified lower leg with unspecified severity: Secondary | ICD-10-CM

## 2010-12-31 NOTE — Patient Instructions (Signed)
Looks great!   Keep it clean  And check it daily for infectionand swelling Continue to wear support stocking Keep follow-up with Dr. Ashley Royalty

## 2010-12-31 NOTE — Progress Notes (Signed)
  Subjective:    Patient ID: [name], male    DOB: 1961/02/20, 50 y.o.   MRN: [id number]  HPIPatient with CP, MR, lives in a group home.  Patient is nonambulatory.  Here for follow-up of wound- had previously seen wound care but caregiver states has not seen in about a month due to being discharged for completion of care.  They did not have any wound care follow-up since speaking with Luretha Murphy.   Saw Luretha Murphy on 7/27, had chronic medial malleolus wound and marked edema. Was placed on doxycycline, had compression wrap placed.  Was asked to come for 1 week follow-up  No fever, chills, nausea, vomiting, drainage.  No pain (does not have feeling) Review of Systems     Objective:   Physical Exam  GEN:  In wheelchair, contractures noted Left leg:  Compression wrap removed, edema resolved.  Some 1-2+ pitting edema noted on foot where wrap did not compress. Medial malleolus ulcer much improved, now very superficial, 0.75-1cm in diameter.  No heel pressure ulcer.  Both feet in protective boots. Toes cool to the touch.      Assessment & Plan:

## 2010-12-31 NOTE — Assessment & Plan Note (Signed)
Left medial malleolus ulcer improved with compression wrap and no evidence of cellulitis.  Advised to finish course of abx, continue light compression hose he has at home.    They will follow-up with pcp on Aug 20th.  Given red flags for return. But given marked improvement, expect continued wound healing.

## 2011-01-02 ENCOUNTER — Ambulatory Visit: Payer: Medicare Other | Attending: Family Medicine | Admitting: Physical Therapy

## 2011-01-02 DIAGNOSIS — IMO0001 Reserved for inherently not codable concepts without codable children: Secondary | ICD-10-CM | POA: Insufficient documentation

## 2011-01-02 DIAGNOSIS — R293 Abnormal posture: Secondary | ICD-10-CM | POA: Insufficient documentation

## 2011-01-14 ENCOUNTER — Ambulatory Visit (INDEPENDENT_AMBULATORY_CARE_PROVIDER_SITE_OTHER): Payer: Medicare Other | Admitting: Family Medicine

## 2011-01-14 ENCOUNTER — Encounter: Payer: Self-pay | Admitting: Family Medicine

## 2011-01-14 DIAGNOSIS — L97909 Non-pressure chronic ulcer of unspecified part of unspecified lower leg with unspecified severity: Secondary | ICD-10-CM

## 2011-01-15 ENCOUNTER — Encounter: Payer: Medicare Other | Attending: Physical Medicine & Rehabilitation

## 2011-01-15 ENCOUNTER — Ambulatory Visit: Payer: Medicare Other | Admitting: Physical Medicine & Rehabilitation

## 2011-01-15 DIAGNOSIS — G808 Other cerebral palsy: Secondary | ICD-10-CM | POA: Insufficient documentation

## 2011-01-15 DIAGNOSIS — Z993 Dependence on wheelchair: Secondary | ICD-10-CM | POA: Insufficient documentation

## 2011-01-15 DIAGNOSIS — R471 Dysarthria and anarthria: Secondary | ICD-10-CM | POA: Insufficient documentation

## 2011-01-15 DIAGNOSIS — M25519 Pain in unspecified shoulder: Secondary | ICD-10-CM | POA: Insufficient documentation

## 2011-01-16 NOTE — Assessment & Plan Note (Signed)
REASON FOR VISIT:  Spasticity and history of cerebral palsy.  HISTORY:  A 50 year old male with history of spastic quadriplegia due to cerebral palsy, wheelchair-bound, totally dependent all ADLs.  He has been trialed on Botox injection for the adductors, he has been trialed on a phenol injections and obturator nerve blocks.  These have provided only minimal relief.  We have been increasing on his baclofen slowly over time and he is currently on 20 mg t.i.d.  No excessive sedation.  I talked to his caregiver today who states it is still quite difficult to get his legs apart for dressing and hygiene activities.  He has had no new medical problems in the interval time.  REVIEW OF SYSTEMS:  Negative shoulder pain.  This was relieved.  PHYSICAL EXAMINATION:  VITAL SIGNS:  Blood pressure 130/63, pulse 82, respirations 18, and O2 sat 97% on room air. EXTREMITIES:  His left shoulder has no pain with range of motion.  He has essentially no motor strength in lower extremities and in the upper extremity he has 3- in the deltoid biceps and grip with increased tone and decreased coordination.  Speech is dysarthric.  Short phrase length. Good comprehension.  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  PLAN:  We will increase his baclofen 20 p.o. q.i.d.  I will see him back in 6 months.  Consider addition of Zanaflex if this is not particularly efficacious.     [name], M.D. Electronically Signed    AEK/MedQ D:  01/15/2011 10:25:42  T:  01/15/2011 11:03:09  Job #:  [id number]

## 2011-01-22 NOTE — Progress Notes (Signed)
  Subjective:    Patient ID: [name], male    DOB: 1961/04/24, 50 y.o.   MRN: [id number]  HPI 1. Wound:  Has had L medial malleolar ulcer with mild cellulitis that was first treated by [name] on 12/21/2010.  Given rocephin x1 in the office  And compression wrap placed then continued on bactrim.  F/u with [name] a couple weeks ago showed marked improvement.  He was continued on abx at that time and told to f/u with me.  Caregiver feels like area is healing well.  Has not noticed drainage or redness around the area.  Currently denies fever, chills, pain.  2.  Recent Illness:  Caregiver reports [name] was taken to [place] urgent care on 01/05/11 because he "felt bad."  Was found to have fever and elevated WBC count at [facility], told this was likely viral.  Did not think this was related to foot ulcer.  This resolved within a couple days and he has not had return of symptoms since that time.   Review of Systems Per HPI    Objective:   Physical Exam  Constitutional: No distress.  Cardiovascular: Normal rate, regular rhythm and normal heart sounds.  Exam reveals no gallop and no friction rub.   No murmur heard. Pulmonary/Chest: Effort normal and breath sounds normal. No respiratory distress. He has no wheezes.  Musculoskeletal:       Feet:          Assessment & Plan:

## 2011-01-22 NOTE — Assessment & Plan Note (Signed)
Resolved at this point.  Advised to return if looked like this was returning or noticed any other areas.  Advised of red flags.

## 2011-02-22 ENCOUNTER — Ambulatory Visit (INDEPENDENT_AMBULATORY_CARE_PROVIDER_SITE_OTHER): Payer: Medicare Other | Admitting: Family Medicine

## 2011-02-22 ENCOUNTER — Encounter: Payer: Self-pay | Admitting: Family Medicine

## 2011-02-22 DIAGNOSIS — L538 Other specified erythematous conditions: Secondary | ICD-10-CM

## 2011-02-22 DIAGNOSIS — L304 Erythema intertrigo: Secondary | ICD-10-CM | POA: Insufficient documentation

## 2011-02-22 MED ORDER — KETOCONAZOLE 2 % EX CREA: TOPICAL_CREAM | CUTANEOUS | Status: DC

## 2011-02-22 NOTE — Assessment & Plan Note (Signed)
Antifungal cream written for a larger tube and to use in areas that are red and inflamed.

## 2011-02-22 NOTE — Progress Notes (Signed)
  Subjective:    Patient ID: [name], male    DOB: 12/20/1960, 50 y.o.   MRN: [id number]  HPI  Brought in by group home for red rash under arms and in groin.  Staff report more accidents than usual and staying wet more.  He has an antifungal cream ordered for his face but they cannot apply it anywhere else unless written by a provider.  Leg ulcer healed.  Review of Systems  Constitutional: Negative for fever.  Skin: Positive for rash.       Objective:   Physical Exam  Constitutional:       Impaired 1 male in a wheelchair.  Skin:       Red raised rash under right arm, and in groin area.  Satellite lesions present, no breakdown          Assessment & Plan:

## 2011-02-22 NOTE — Patient Instructions (Signed)
Yeast cream to red inflamed areas daily Refilled

## 2011-03-04 ENCOUNTER — Encounter: Payer: Self-pay | Admitting: Family Medicine

## 2011-03-04 ENCOUNTER — Ambulatory Visit (INDEPENDENT_AMBULATORY_CARE_PROVIDER_SITE_OTHER): Payer: Medicare Other | Admitting: Family Medicine

## 2011-03-04 DIAGNOSIS — L304 Erythema intertrigo: Secondary | ICD-10-CM

## 2011-03-04 DIAGNOSIS — L538 Other specified erythematous conditions: Secondary | ICD-10-CM

## 2011-03-04 DIAGNOSIS — G809 Cerebral palsy, unspecified: Secondary | ICD-10-CM

## 2011-03-13 ENCOUNTER — Telehealth: Payer: Self-pay | Admitting: Family Medicine

## 2011-03-13 NOTE — Telephone Encounter (Signed)
Paperwork for the Power Wheelchair was faxed over last week and she is checking on the status of this paperwork.

## 2011-03-13 NOTE — Telephone Encounter (Signed)
Will forward to Dr Matthews 

## 2011-03-14 NOTE — Assessment & Plan Note (Signed)
This is resolved. Advised that if this returns they can use the antifungal cream once again.

## 2011-03-14 NOTE — Assessment & Plan Note (Signed)
Patient with cerebral palsy, he is wheelchair dependent.   He currently does not have the strength to propel himself in a manual wheelchair and is dependent on his caregiver for ambulation. He has been evaluated for a power wheelchair. I think this would be the most appropriate thing for him at this time to give him more independence and allow him to transport on is own.   He does have a history of multiple lower extremity ulcerations from pressure and has difficulty transferring his weight in the seat. Additional padding in the seat as well as lower extremity area would be appropriate.

## 2011-03-14 NOTE — Progress Notes (Signed)
  Subjective:    Patient ID: [name], male    DOB: [birth year], 50 y.o.   MRN: [id number]  HPI 1.  Paperwork for power wheelchair: Patient is here today with his caregiver who brings him in with paperwork to request a power wheelchair.  Patient with diagnosis of cerebral palsy.  He spends almost all his time in a manual wheelchair.  He does have spastic paralysis in his lower extremities and requires a wheelchair for ambulation. With his manual wheelchair he does not have the upper body strength to propel himself and is requesting a power wheelchair for increased independence. 2. Rash: Previously seen for intertriginous rash. He has been using an antifungal cream on these areas in his groin and under his arms. Areas of rash has improved significantly and he is no longer experiencing itching in these areas.   Review of Systems     Objective:   Physical Exam  Constitutional: He appears well-nourished. No distress.       Sitting in manual wheelchair.  HENT:  Mouth/Throat: Oropharynx is clear and moist.  Eyes: Right eye exhibits no discharge. Left eye exhibits no discharge. No scleral icterus.  Cardiovascular: Normal rate, regular rhythm and normal heart sounds.   Pulmonary/Chest: Effort normal and breath sounds normal.  Abdominal: Soft. Bowel sounds are normal.  Musculoskeletal:       Patient is a manual wheelchair. Feet are elevated with  Padded boots. He is leaned over to the right side in his wheelchair and has difficulty shifting his weight. He is unable to propel himself in his manual wheelchair.  Skin: Skin is warm and dry.       Minimal erythema seen in intertriginous areas. No satellite lesions are seen today.          Assessment & Plan:

## 2011-03-15 NOTE — Telephone Encounter (Signed)
Form completed and placed in to be faxed bin at front office.

## 2011-07-10 ENCOUNTER — Ambulatory Visit (INDEPENDENT_AMBULATORY_CARE_PROVIDER_SITE_OTHER): Payer: Medicare Other | Admitting: Family Medicine

## 2011-07-10 DIAGNOSIS — Z5181 Encounter for therapeutic drug level monitoring: Secondary | ICD-10-CM | POA: Insufficient documentation

## 2011-07-10 DIAGNOSIS — E785 Hyperlipidemia, unspecified: Secondary | ICD-10-CM

## 2011-07-10 DIAGNOSIS — L97509 Non-pressure chronic ulcer of other part of unspecified foot with unspecified severity: Secondary | ICD-10-CM | POA: Insufficient documentation

## 2011-07-10 LAB — COMPREHENSIVE METABOLIC PANEL
ALT: 13 U/L (ref 0–53)
AST: 11 U/L (ref 0–37)
CO2: 26 mEq/L (ref 19–32)
Chloride: 105 mEq/L (ref 96–112)
Creat: 0.57 mg/dL (ref 0.50–1.35)
Sodium: 141 mEq/L (ref 135–145)
Total Bilirubin: 0.3 mg/dL (ref 0.3–1.2)
Total Protein: 6.7 g/dL (ref 6.0–8.3)

## 2011-07-10 LAB — CBC
Hemoglobin: 13.2 g/dL (ref 13.0–17.0)
MCH: 27.6 pg (ref 26.0–34.0)
RBC: 4.79 MIL/uL (ref 4.22–5.81)

## 2011-07-10 LAB — LDL CHOLESTEROL, DIRECT: Direct LDL: 96 mg/dL

## 2011-07-10 NOTE — Patient Instructions (Signed)
Thank you for coming in today, it was good to see you I do not see a colonoscopy on file for [name], I would suggest calling around to [facility], [facility], or [facility] to schedule a screening colonoscopy For his ankle on his right foot I would apply triple antibiotic and cover with wet to dry gauze dressing. I will send over a referral to podiatry for his foot issues.  We will call you when the other form is completed and ready for pick up

## 2011-07-11 LAB — VALPROIC ACID LEVEL: Valproic Acid Lvl: 56.3 ug/mL (ref 50.0–100.0)

## 2011-07-16 ENCOUNTER — Ambulatory Visit: Payer: Medicare Other | Admitting: Physical Medicine & Rehabilitation

## 2011-07-18 ENCOUNTER — Encounter: Payer: Self-pay | Admitting: Family Medicine

## 2011-07-18 NOTE — Progress Notes (Signed)
SUBJECTIVE:  [name] is a 51 y.o. male presenting for his annual checkup.  Concerns today include foot care.  Would like to be referred to podiatrist foot care because of history of ulcerations of the foot and scattered pre-ulcerative areas on the feet Current Outpatient Prescriptions  Medication Sig Dispense Refill  . baclofen (LIORESAL) 10 MG tablet Take 10 mg by mouth 2 (two) times daily.        . divalproex (DEPAKOTE) 500 MG EC tablet ER 500mg  PO 2 tablets at bedtime per [facility]       . doxycycline (VIBRAMYCIN) 100 MG capsule Take 100 mg by mouth daily.        . Hydrocortisone Acetate 1 % CREA Apply daily to face       . ketoconazole (NIZORAL) 2 % cream Apply to affected area daily, face, skin folds, groin, underarms  75 g  11  . lansoprazole (PREVACID SOLUTAB) 30 MG disintegrating tablet Take 1 tablet (30 mg total) by mouth daily.  90 tablet  3  . LORazepam (ATIVAN) 1 MG tablet One by mouth q 4 hours as needed       . Multiple Vitamin (MULTIVITAMIN) capsule Take 1 capsule by mouth daily.  30 capsule  11  . risperiDONE (RISPERDAL) 1 MG tablet One by mouth at bedtime       . sulfamethoxazole-trimethoprim (BACTRIM DS) 800-160 MG per tablet 2 tabs bid for 2 weeks  56 tablet  0  . tolterodine (DETROL LA) 2 MG 24 hr capsule Take 1 capsule (2 mg total) by mouth daily.  90 capsule  3  . tretinoin (RETIN-A) 0.025 % cream Apply topically at bedtime. To face       . triamcinolone (KENALOG) 0.1 % cream Apply topically 2 (two) times daily. Apply to dry, itchy areas of skin on body (not face, axillae, or groin) up to 2 times daily as needed. When not itchy, do not apply. Dispense 1 pound jar.  30 g  12   Allergies: Adhesive   ROS:  Feeling well. No dyspnea or chest pain on exertion. No abdominal pain, change in bowel habits, black or bloody stools. No urinary tract or prostatic symptoms. No neurological complaints.  OBJECTIVE:  The patient appears well, alert, oriented x 3, in no  distress.  BP 114/80  Pulse 80  Temp(Src) 98.4 F (36.9 C) (Oral) ENT normal.  Syndromic appearance, in electric wheelchair Neck supple. No adenopathy or thyromegaly. PERLA.  Lungs are clear, good air entry, no wheezes, rhonchi or rales.  S1 and S2 normal, no murmurs, regular rate and rhythm.  Abdomen is soft without tenderness, guarding, mass or organomegaly.    Extremities show no edema, normal peripheral pulses.  There are scattered areas on foot that have scabbed over, look to be healing/healed ulcerations.  He does have a fresh wound on the medial malleolus of the L ankle, no signs of infection.    ASSESSMENT:  Scattered foot lesions, otherwise healthy  PLAN:  Check depakote level, chem panel, LDL Referral for podiatry Advised to set up colonoscopy, given list of GI specialists in town.

## 2011-07-26 ENCOUNTER — Encounter: Payer: Self-pay | Admitting: Family Medicine

## 2011-08-01 ENCOUNTER — Encounter: Payer: Self-pay | Admitting: Physical Medicine & Rehabilitation

## 2011-08-01 ENCOUNTER — Ambulatory Visit (HOSPITAL_BASED_OUTPATIENT_CLINIC_OR_DEPARTMENT_OTHER): Payer: Medicare Other | Admitting: Physical Medicine & Rehabilitation

## 2011-08-01 ENCOUNTER — Encounter: Payer: Medicare Other | Attending: Physical Medicine & Rehabilitation

## 2011-08-01 VITALS — BP 145/76 | HR 79 | Resp 18 | Ht 70.0 in

## 2011-08-01 DIAGNOSIS — F09 Unspecified mental disorder due to known physiological condition: Secondary | ICD-10-CM | POA: Insufficient documentation

## 2011-08-01 DIAGNOSIS — G809 Cerebral palsy, unspecified: Secondary | ICD-10-CM | POA: Insufficient documentation

## 2011-08-01 DIAGNOSIS — G808 Other cerebral palsy: Secondary | ICD-10-CM

## 2011-08-01 NOTE — Progress Notes (Signed)
  Subjective:    Patient ID: [name], male    DOB: 04/26/1961, 51 y.o.   MRN: [id number]  HPI Pain inventory deferred/no pain complaint REASON FOR VISIT: Spasticity and history of cerebral palsy.  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 He has  been trialed on Botox injection for the adductors, he has been trialed  on a phenol injections and obturator nerve blocks. These have provided  only minimal relief. We have been increasing on his baclofen slowly  over time and he is currently on 20 mg t.i.d. No excessive sedation. I  talked to his caregiver today who states it is still quite difficult to  get his legs apart for dressing and hygiene activities.  He has had no new medical problems in the interval time.  Pain Inventory Average Pain 0 Pain Right Now  My pain is   In the last 24 hours, has pain interfered with the following? General activity  Relation with others  Enjoyment of life  What TIME of day is your pain at its worst?  Sleep (in general) Good  Pain is worse with:  Pain improves with:  Relief from Meds:   Mobility ability to climb steps?  no do you drive?  no use a wheelchair in power chair today  Function not employed: date last employed Needs assist w/ dressing,bathing,toileting, meal prep,household duties, shopping Neuro/Psych depression anxiety  Prior Studies Any changes since last visit?  no  Physicians involved in your care Any changes since last visit?  no      Review of Systems  HENT: Negative.   Eyes: Negative.   Respiratory: Negative.   Cardiovascular: Negative.   Gastrointestinal: Negative.   Genitourinary: Negative.   Musculoskeletal:       Muscle tightness in RUE>LUE, both legs  Skin:       On creams for facial rash, chronic  Neurological: Positive for weakness.  Hematological: Negative.   Psychiatric/Behavioral: Negative for  agitation (rarely).       Objective:   Physical Exam  Spastic tetraplegia with 2 minus strength in all 4 extremities. There is increased tone in bilateral upper extremity forearm and arm flexors as well as hip abductors and ankle plantar flexors. Cognition is limited he is oriented to person and to place but not to time.      Assessment & Plan:  1. 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 He is on a stable regimen of baclofen. I spoke with his caregiver who indicates that his care needs are well taken care of at the group home. He has not had any new issues in the interval time. I will see him back in 9 months.

## 2011-08-01 NOTE — Patient Instructions (Signed)
Continue current medications. 

## 2011-08-28 ENCOUNTER — Encounter: Payer: Self-pay | Admitting: Physical Medicine & Rehabilitation

## 2012-03-11 ENCOUNTER — Ambulatory Visit (INDEPENDENT_AMBULATORY_CARE_PROVIDER_SITE_OTHER): Payer: Medicare Other | Admitting: Family Medicine

## 2012-03-11 ENCOUNTER — Encounter: Payer: Self-pay | Admitting: Family Medicine

## 2012-03-11 VITALS — BP 132/88 | HR 97

## 2012-03-11 DIAGNOSIS — L0291 Cutaneous abscess, unspecified: Secondary | ICD-10-CM

## 2012-03-11 DIAGNOSIS — L0390 Cellulitis, unspecified: Secondary | ICD-10-CM | POA: Insufficient documentation

## 2012-03-11 DIAGNOSIS — L97509 Non-pressure chronic ulcer of other part of unspecified foot with unspecified severity: Secondary | ICD-10-CM

## 2012-03-11 MED ORDER — DOXYCYCLINE HYCLATE 100 MG PO TABS: 100.0000 mg | ORAL_TABLET | Freq: Two times a day (BID) | ORAL | Status: DC

## 2012-03-11 NOTE — Assessment & Plan Note (Signed)
Redness and warmth 4cm from ulcer margin. Will treat with doxycycline x 10 days. Recheck in 5-7 days.

## 2012-03-11 NOTE — Assessment & Plan Note (Signed)
Has an ulceration of left medial malleolus for 4-5 days, perhaps related to worsened venous insufficiency and some pressure from the underlying malleolar bone. No sign of arterial compromise and sensation is intact. Suspect some overlying cellulitis also. Wound bed cleaned with saline guaze. Unna boot is applied. Will start antibiotic, advised elevation of legs as much as possible. Return to care in 5-7 days for recheck. May need to return to wound center if not healing. Discussed red flags to return sooner fever, chills, spread or redness or worsening pain.

## 2012-03-11 NOTE — Patient Instructions (Addendum)
You have ulcer from pressure and swelling. Start taking the antibiotic twice daily. Make appointment for check up in 5-7 days. If you have fever, worsened redness or pain then come back sooner.   Stasis Ulcer  A stasis ulcer is a sore on the skin. It occurs in the legs when your blood flow is damaged.  HOME CARE  Do not stand or sit in one position for a long time. Do not sit with your legs crossed. Raise (elevate) your legs.  Wear elastic stockings (compression stockings). Do not wear tight clothing around the legs or waist area. Use and apply bandages (dressings) as told.  Walk as much as possible. If you take long car or plane rides, take a break to walk around every 2 hours.  Only take medicine as told by your doctor.  Raise the end of your bed with 2-inch blocks only if your doctor says it is okay.  If you cut the skin on your leg, lie down and raise your leg. Gently clean the area with a clean cloth. Then, put pressure on the cut until the bleeding stops. Put a bandage on.  Keep all doctor visits. GET HELP RIGHT AWAY IF:  The ulcer area starts to break down.  You have pain, redness, or tenderness in or around the ulcer.  You have yellowish-white fluid (pus) or hard puffiness (swelling) in or around the ulcer.  Your pain gets worse.  You get a fever.  You have chest pain or shortness of breath. MAKE SURE YOU:  Understand these instructions.  Will watch your condition.  Will get help right away if you are not doing well or get worse. Document Released: 06/20/2004 Document Revised: 08/05/2011 Document Reviewed: 09/11/2010 [facility] Patient Information 2013 [place], [place].

## 2012-03-11 NOTE — Progress Notes (Signed)
  Subjective:    Patient ID: [name], male    DOB: 07-Nov-1960, 51 y.o.   MRN: [id number]  HPI  1. Left foot wound. Patient with CP and quadriplegia with history of venous stasis and ulcers previously. Began having a painful ulcer on left inner foot started 4-5 days ago according to pt and his group home aide. He thinks he hit his foot on something prior to this. He occasionally gets swelling in legs that is positional from sitting in wheelchair, and there is some worsening recently. They note some oozing, redness and pain at the area. Has compression stockings but not wearing them recently.  Has previous wounds treated at the wound center.   Denies calf pain, fever, chills, nausea, numbness, tingling, dyspnea, chest pain.  Review of Systems See HPI otherwise negative.  reports that he has never smoked. He does not have any smokeless tobacco history on file.     Objective:   Physical Exam  Vitals reviewed. Constitutional: He appears well-developed and well-nourished. No distress.       Patient is wheelchair bound.   HENT:  Head: Normocephalic and atraumatic.  Cardiovascular: Normal rate, regular rhythm and normal heart sounds.   Pulmonary/Chest: Effort normal.  Musculoskeletal: He exhibits edema and tenderness.       Legs are atrophied, 0/5 bilateral LE strength. Left foot with 2+ pedal edema that extends to mid calf. There is a 2 cm shallow ulceration at medial malleolar area with erythema surrounding 4cm circumference. mild warmth. Wound bed is tender, granulation tissue. There is serosanguinous drainage. No underlying abscess or sinus tracts noted.  No heel ulcers. Good capillary refill.  No calf TTP.  Neurological: He is alert.  Skin: He is not diaphoretic.        Assessment & Plan:

## 2012-03-18 ENCOUNTER — Encounter: Payer: Self-pay | Admitting: Family Medicine

## 2012-03-18 ENCOUNTER — Ambulatory Visit (INDEPENDENT_AMBULATORY_CARE_PROVIDER_SITE_OTHER): Payer: Medicare Other | Admitting: Family Medicine

## 2012-03-18 ENCOUNTER — Ambulatory Visit: Payer: Medicare Other | Admitting: Family Medicine

## 2012-03-18 VITALS — BP 139/80 | HR 94 | Temp 98.3°F

## 2012-03-18 DIAGNOSIS — L97509 Non-pressure chronic ulcer of other part of unspecified foot with unspecified severity: Secondary | ICD-10-CM

## 2012-03-18 DIAGNOSIS — L97909 Non-pressure chronic ulcer of unspecified part of unspecified lower leg with unspecified severity: Secondary | ICD-10-CM

## 2012-03-18 DIAGNOSIS — L0291 Cutaneous abscess, unspecified: Secondary | ICD-10-CM

## 2012-03-18 DIAGNOSIS — L0390 Cellulitis, unspecified: Secondary | ICD-10-CM

## 2012-03-18 NOTE — Assessment & Plan Note (Signed)
This was dressed with hydrcolloid dressing and gauze. I will order home wound care.

## 2012-03-18 NOTE — Assessment & Plan Note (Signed)
Continue doxycycline. F/u if fever, worsening pain, spreading redness or swelling.

## 2012-03-18 NOTE — Patient Instructions (Addendum)
Please take care of this foot as best you can by cushioning it. We will put in an order for home health to come address it. Please come back if he develops a fever or severe pain.   Take Care,   Dr. Clinton Sawyer

## 2012-03-18 NOTE — Progress Notes (Signed)
  Subjective:    Patient ID: ADONI HEINIG, male    DOB: 11-May-1961, 51 y.o.   MRN: [id number]  HPI  51 year old male with cerebral palsy and mental retardation who presents for evaluation of left ankle ulcer. It was first evaluated by Dr. Cristal Ford on 03/11/12 and an Unaboot was placed. He is brought in by a care giver from his residential facility. She denies any fever or new swelling. He denies pain. He is taking doxycycline BID with 3 days remaining. This has been a chronic problem for him, given the shape of his legs.   PHM: multiple ulceration of medial malleoli bilaterally Soc: lives in [place] [facility]   Review of Systems     Objective:   Physical Exam BP 139/80  Pulse 94  Temp 98.3 F (36.8 C) (Oral) Gen: milddle age white male, wheel chair confined, alert and oriented x 4 MSK: 4 cm x 2 cm ulceration along left medial malleolus with good granulation tissue, serosanguinous drainage, surrounding erythema, mild TTP, 2+ pitting edema of dorsum of left foot       Assessment & Plan:  51 year old with stage 2 pressure ulcer and stable cellulitis that needs wound care.

## 2012-05-01 ENCOUNTER — Encounter: Payer: Self-pay | Admitting: Physical Medicine & Rehabilitation

## 2012-05-01 ENCOUNTER — Ambulatory Visit (HOSPITAL_BASED_OUTPATIENT_CLINIC_OR_DEPARTMENT_OTHER): Payer: Medicare Other | Admitting: Physical Medicine & Rehabilitation

## 2012-05-01 ENCOUNTER — Encounter: Payer: Medicare Other | Attending: Physical Medicine & Rehabilitation

## 2012-05-01 VITALS — BP 142/53 | HR 87 | Resp 14 | Ht 70.0 in | Wt 165.0 lb

## 2012-05-01 DIAGNOSIS — M75100 Unspecified rotator cuff tear or rupture of unspecified shoulder, not specified as traumatic: Secondary | ICD-10-CM

## 2012-05-01 DIAGNOSIS — M7540 Impingement syndrome of unspecified shoulder: Secondary | ICD-10-CM

## 2012-05-01 DIAGNOSIS — G808 Other cerebral palsy: Secondary | ICD-10-CM

## 2012-05-01 DIAGNOSIS — M25519 Pain in unspecified shoulder: Secondary | ICD-10-CM | POA: Insufficient documentation

## 2012-05-01 MED ORDER — BACLOFEN 20 MG PO TABS: 20.0000 mg | ORAL_TABLET | Freq: Four times a day (QID) | ORAL | Status: DC

## 2012-05-01 NOTE — Patient Instructions (Signed)
Impingement Syndrome, Rotator Cuff, Bursitis with Rehab Impingement syndrome is a condition that involves inflammation of the tendons of the rotator cuff and the subacromial bursa, that causes pain in the shoulder. The rotator cuff consists of four tendons and muscles that control much of the shoulder and upper arm function. The subacromial bursa is a fluid filled sac that helps reduce friction between the rotator cuff and one of the bones of the shoulder (acromion). Impingement syndrome is usually an overuse injury that causes swelling of the bursa (bursitis), swelling of the tendon (tendonitis), and/or a tear of the tendon (strain). Strains are classified into three categories. Grade 1 strains cause pain, but the tendon is not lengthened. Grade 2 strains include a lengthened ligament, due to the ligament being stretched or partially ruptured. With grade 2 strains there is still function, although the function may be decreased. Grade 3 strains include a complete tear of the tendon or muscle, and function is usually impaired. SYMPTOMS   Pain around the shoulder, often at the outer portion of the upper arm.  Pain that gets worse with shoulder function, especially when reaching overhead or lifting.  Sometimes, aching when not using the arm.  Pain that wakes you up at night.  Sometimes, tenderness, swelling, warmth, or redness over the affected area.  Loss of strength.  Limited motion of the shoulder, especially reaching behind the back (to the back pocket or to unhook bra) or across your body.  Crackling sound (crepitation) when moving the arm.  Biceps tendon pain and inflammation (in the front of the shoulder). Worse when bending the elbow or lifting. CAUSES  Impingement syndrome is often an overuse injury, in which chronic (repetitive) motions cause the tendons or bursa to become inflamed. A strain occurs when a force is paced on the tendon or muscle that is greater than it can withstand.  Common mechanisms of injury include: Stress from sudden increase in duration, frequency, or intensity of training.  Direct hit (trauma) to the shoulder.  Aging, erosion of the tendon with normal use.  Bony bump on shoulder (acromial spur). RISK INCREASES WITH:  Contact sports (football, wrestling, boxing).  Throwing sports (baseball, tennis, volleyball).  Weightlifting and bodybuilding.  Heavy labor.  Previous injury to the rotator cuff, including impingement.  Poor shoulder strength and flexibility.  Failure to warm up properly before activity.  Inadequate protective equipment.  Old age.  Bony bump on shoulder (acromial spur). PREVENTION   Warm up and stretch properly before activity.  Allow for adequate recovery between workouts.  Maintain physical fitness:  Strength, flexibility, and endurance.  Cardiovascular fitness.  Learn and use proper exercise technique. PROGNOSIS  If treated properly, impingement syndrome usually goes away within 6 weeks. Sometimes surgery is required.  RELATED COMPLICATIONS   Longer healing time if not properly treated, or if not given enough time to heal.  Recurring symptoms, that result in a chronic condition.  Shoulder stiffness, frozen shoulder, or loss of motion.  Rotator cuff tendon tear.  Recurring symptoms, especially if activity is resumed too soon, with overuse, with a direct blow, or when using poor technique. TREATMENT  Treatment first involves the use of ice and medicine, to reduce pain and inflammation. The use of strengthening and stretching exercises may help reduce pain with activity. These exercises may be performed at home or with a therapist. If non-surgical treatment is unsuccessful after more than 6 months, surgery may be advised. After surgery and rehabilitation, activity is usually possible in 3 months.    MEDICATION  If pain medicine is needed, nonsteroidal anti-inflammatory medicines (aspirin and  ibuprofen), or other minor pain relievers (acetaminophen), are often advised.  Do not take pain medicine for 7 days before surgery.  Prescription pain relievers may be given, if your caregiver thinks they are needed. Use only as directed and only as much as you need.  Corticosteroid injections may be given by your caregiver. These injections should be reserved for the most serious cases, because they may only be given a certain number of times. HEAT AND COLD  Cold treatment (icing) should be applied for 10 to 15 minutes every 2 to 3 hours for inflammation and pain, and immediately after activity that aggravates your symptoms. Use ice packs or an ice massage.  Heat treatment may be used before performing stretching and strengthening activities prescribed by your caregiver, physical therapist, or athletic trainer. Use a heat pack or a warm water soak. SEEK MEDICAL CARE IF:   Symptoms get worse or do not improve in 4 to 6 weeks, despite treatment.  New, unexplained symptoms develop. (Drugs used in treatment may produce side effects.) EXERCISES  RANGE OF MOTION (ROM) AND STRETCHING EXERCISES - Impingement Syndrome (Rotator Cuff  Tendinitis, Bursitis) These exercises may help you when beginning to rehabilitate your injury. Your symptoms may go away with or without further involvement from your physician, physical therapist or athletic trainer. While completing these exercises, remember:   Restoring tissue flexibility helps normal motion to return to the joints. This allows healthier, less painful movement and activity.  An effective stretch should be held for at least 30 seconds.  A stretch should never be painful. You should only feel a gentle lengthening or release in the stretched tissue. STRETCH  Flexion, Standing  Stand with good posture. With an underhand grip on your right / left hand, and an overhand grip on the opposite hand, grasp a broomstick or cane so that your hands are a little  more than shoulder width apart.  Keeping your right / left elbow straight and shoulder muscles relaxed, push the stick with your opposite hand, to raise your right / left arm in front of your body and then overhead. Raise your arm until you feel a stretch in your right / left shoulder, but before you have increased shoulder pain.  Try to avoid shrugging your right / left shoulder as your arm rises, by keeping your shoulder blade tucked down and toward your mid-back spine. Hold for __________ seconds.  Slowly return to the starting position. Repeat __________ times. Complete this exercise __________ times per day. STRETCH  Abduction, Supine  Lie on your back. With an underhand grip on your right / left hand and an overhand grip on the opposite hand, grasp a broomstick or cane so that your hands are a little more than shoulder width apart.  Keeping your right / left elbow straight and your shoulder muscles relaxed, push the stick with your opposite hand, to raise your right / left arm out to the side of your body and then overhead. Raise your arm until you feel a stretch in your right / left shoulder, but before you have increased shoulder pain.  Try to avoid shrugging your right / left shoulder as your arm rises, by keeping your shoulder blade tucked down and toward your mid-back spine. Hold for __________ seconds.  Slowly return to the starting position. Repeat __________ times. Complete this exercise __________ times per day. ROM  Flexion, Active-Assisted  Lie on your back.   You may bend your knees for comfort.  Grasp a broomstick or cane so your hands are about shoulder width apart. Your right / left hand should grip the end of the stick, so that your hand is positioned "thumbs-up," as if you were about to shake hands.  Using your healthy arm to lead, raise your right / left arm overhead, until you feel a gentle stretch in your shoulder. Hold for __________ seconds.  Use the stick to  assist in returning your right / left arm to its starting position. Repeat __________ times. Complete this exercise __________ times per day.  ROM - Internal Rotation, Supine   Lie on your back on a firm surface. Place your right / left elbow about 60 degrees away from your side. Elevate your elbow with a folded towel, so that the elbow and shoulder are the same height.  Using a broomstick or cane and your strong arm, pull your right / left hand toward your body until you feel a gentle stretch, but no increase in your shoulder pain. Keep your shoulder and elbow in place throughout the exercise.  Hold for __________ seconds. Slowly return to the starting position. Repeat __________ times. Complete this exercise __________ times per day. STRETCH - Internal Rotation  Place your right / left hand behind your back, palm up.  Throw a towel or belt over your opposite shoulder. Grasp the towel with your right / left hand.  While keeping an upright posture, gently pull up on the towel, until you feel a stretch in the front of your right / left shoulder.  Avoid shrugging your right / left shoulder as your arm rises, by keeping your shoulder blade tucked down and toward your mid-back spine.  Hold for __________ seconds. Release the stretch, by lowering your healthy hand. Repeat __________ times. Complete this exercise __________ times per day. ROM - Internal Rotation   Using an underhand grip, grasp a stick behind your back with both hands.  While standing upright with good posture, slide the stick up your back until you feel a mild stretch in the front of your shoulder.  Hold for __________ seconds. Slowly return to your starting position. Repeat __________ times. Complete this exercise __________ times per day.  STRETCH  Posterior Shoulder Capsule   Stand or sit with good posture. Grasp your right / left elbow and draw it across your chest, keeping it at the same height as your  shoulder.  Pull your elbow, so your upper arm comes in closer to your chest. Pull until you feel a gentle stretch in the back of your shoulder.  Hold for __________ seconds. Repeat __________ times. Complete this exercise __________ times per day. STRENGTHENING EXERCISES - Impingement Syndrome (Rotator Cuff Tendinitis, Bursitis) These exercises may help you when beginning to rehabilitate your injury. They may resolve your symptoms with or without further involvement from your physician, physical therapist or athletic trainer. While completing these exercises, remember:  Muscles can gain both the endurance and the strength needed for everyday activities through controlled exercises.  Complete these exercises as instructed by your physician, physical therapist or athletic trainer. Increase the resistance and repetitions only as guided.  You may experience muscle soreness or fatigue, but the pain or discomfort you are trying to eliminate should never worsen during these exercises. If this pain does get worse, stop and make sure you are following the directions exactly. If the pain is still present after adjustments, discontinue the exercise until you can discuss   the trouble with your clinician.  During your recovery, avoid activity or exercises which involve actions that place your injured hand or elbow above your head or behind your back or head. These positions stress the tissues which you are trying to heal. STRENGTH - Scapular Depression and Adduction   With good posture, sit on a firm chair. Support your arms in front of you, with pillows, arm rests, or on a table top. Have your elbows in line with the sides of your body.  Gently draw your shoulder blades down and toward your mid-back spine. Gradually increase the tension, without tensing the muscles along the top of your shoulders and the back of your neck.  Hold for __________ seconds. Slowly release the tension and relax your muscles  completely before starting the next repetition.  After you have practiced this exercise, remove the arm support and complete the exercise in standing as well as sitting position. Repeat __________ times. Complete this exercise __________ times per day.  STRENGTH - Shoulder Abductors, Isometric  With good posture, stand or sit about 4-6 inches from a wall, with your right / left side facing the wall.  Bend your right / left elbow. Gently press your right / left elbow into the wall. Increase the pressure gradually, until you are pressing as hard as you can, without shrugging your shoulder or increasing any shoulder discomfort.  Hold for __________ seconds.  Release the tension slowly. Relax your shoulder muscles completely before you begin the next repetition. Repeat __________ times. Complete this exercise __________ times per day.  STRENGTH - External Rotators, Isometric  Keep your right / left elbow at your side and bend it 90 degrees.  Step into a door frame so that the outside of your right / left wrist can press against the door frame without your upper arm leaving your side.  Gently press your right / left wrist into the door frame, as if you were trying to swing the back of your hand away from your stomach. Gradually increase the tension, until you are pressing as hard as you can, without shrugging your shoulder or increasing any shoulder discomfort.  Hold for __________ seconds.  Release the tension slowly. Relax your shoulder muscles completely before you begin the next repetition. Repeat __________ times. Complete this exercise __________ times per day.  STRENGTH - Supraspinatus   Stand or sit with good posture. Grasp a __________ weight, or an exercise band or tubing, so that your hand is "thumbs-up," like you are shaking hands.  Slowly lift your right / left arm in a "V" away from your thigh, diagonally into the space between your side and straight ahead. Lift your hand to  shoulder height or as far as you can, without increasing any shoulder pain. At first, many people do not lift their hands above shoulder height.  Avoid shrugging your right / left shoulder as your arm rises, by keeping your shoulder blade tucked down and toward your mid-back spine.  Hold for __________ seconds. Control the descent of your hand, as you slowly return to your starting position. Repeat __________ times. Complete this exercise __________ times per day.  STRENGTH - External Rotators  Secure a rubber exercise band or tubing to a fixed object (table, pole) so that it is at the same height as your right / left elbow when you are standing or sitting on a firm surface.  Stand or sit so that the secured exercise band is at your uninjured side.  Bend   your right / left elbow 90 degrees. Place a folded towel or small pillow under your right / left arm, so that your elbow is a few inches away from your side.  Keeping the tension on the exercise band, pull it away from your body, as if pivoting on your elbow. Be sure to keep your body steady, so that the movement is coming only from your rotating shoulder.  Hold for __________ seconds. Release the tension in a controlled manner, as you return to the starting position. Repeat __________ times. Complete this exercise __________ times per day.  STRENGTH - Internal Rotators   Secure a rubber exercise band or tubing to a fixed object (table, pole) so that it is at the same height as your right / left elbow when you are standing or sitting on a firm surface.  Stand or sit so that the secured exercise band is at your right / left side.  Bend your elbow 90 degrees. Place a folded towel or small pillow under your right / left arm so that your elbow is a few inches away from your side.  Keeping the tension on the exercise band, pull it across your body, toward your stomach. Be sure to keep your body steady, so that the movement is coming only from  your rotating shoulder.  Hold for __________ seconds. Release the tension in a controlled manner, as you return to the starting position. Repeat __________ times. Complete this exercise __________ times per day.  STRENGTH - Scapular Protractors, Standing   Stand arms length away from a wall. Place your hands on the wall, keeping your elbows straight.  Begin by dropping your shoulder blades down and toward your mid-back spine.  To strengthen your protractors, keep your shoulder blades down, but slide them forward on your rib cage. It will feel as if you are lifting the back of your rib cage away from the wall. This is a subtle motion and can be challenging to complete. Ask your caregiver for further instruction, if you are not sure you are doing the exercise correctly.  Hold for __________ seconds. Slowly return to the starting position, resting the muscles completely before starting the next repetition. Repeat __________ times. Complete this exercise __________ times per day. STRENGTH - Scapular Protractors, Supine  Lie on your back on a firm surface. Extend your right / left arm straight into the air while holding a __________ weight in your hand.  Keeping your head and back in place, lift your shoulder off the floor.  Hold for __________ seconds. Slowly return to the starting position, and allow your muscles to relax completely before starting the next repetition. Repeat __________ times. Complete this exercise __________ times per day. STRENGTH - Scapular Protractors, Quadruped  Get onto your hands and knees, with your shoulders directly over your hands (or as close as you can be, comfortably).  Keeping your elbows locked, lift the back of your rib cage up into your shoulder blades, so your mid-back rounds out. Keep your neck muscles relaxed.  Hold this position for __________ seconds. Slowly return to the starting position and allow your muscles to relax completely before starting the  next repetition. Repeat __________ times. Complete this exercise __________ times per day.  STRENGTH - Scapular Retractors  Secure a rubber exercise band or tubing to a fixed object (table, pole), so that it is at the height of your shoulders when you are either standing, or sitting on a firm armless chair.  With a   palm down grip, grasp an end of the band in each hand. Straighten your elbows and lift your hands straight in front of you, at shoulder height. Step back, away from the secured end of the band, until it becomes tense.  Squeezing your shoulder blades together, draw your elbows back toward your sides, as you bend them. Keep your upper arms lifted away from your body throughout the exercise.  Hold for __________ seconds. Slowly ease the tension on the band, as you reverse the directions and return to the starting position. Repeat __________ times. Complete this exercise __________ times per day. STRENGTH - Shoulder Extensors   Secure a rubber exercise band or tubing to a fixed object (table, pole) so that it is at the height of your shoulders when you are either standing, or sitting on a firm armless chair.  With a thumbs-up grip, grasp an end of the band in each hand. Straighten your elbows and lift your hands straight in front of you, at shoulder height. Step back, away from the secured end of the band, until it becomes tense.  Squeezing your shoulder blades together, pull your hands down to the sides of your thighs. Do not allow your hands to go behind you.  Hold for __________ seconds. Slowly ease the tension on the band, as you reverse the directions and return to the starting position. Repeat __________ times. Complete this exercise __________ times per day.  STRENGTH - Scapular Retractors and External Rotators   Secure a rubber exercise band or tubing to a fixed object (table, pole) so that it is at the height as your shoulders, when you are either standing, or sitting on a  firm armless chair.  With a palm down grip, grasp an end of the band in each hand. Bend your elbows 90 degrees and lift your elbows to shoulder height, at your sides. Step back, away from the secured end of the band, until it becomes tense.  Squeezing your shoulder blades together, rotate your shoulders so that your upper arms and elbows remain stationary, but your fists travel upward to head height.  Hold for __________ seconds. Slowly ease the tension on the band, as you reverse the directions and return to the starting position. Repeat __________ times. Complete this exercise __________ times per day.  STRENGTH - Scapular Retractors and External Rotators, Rowing   Secure a rubber exercise band or tubing to a fixed object (table, pole) so that it is at the height of your shoulders, when you are either standing, or sitting on a firm armless chair.  With a palm down grip, grasp an end of the band in each hand. Straighten your elbows and lift your hands straight in front of you, at shoulder height. Step back, away from the secured end of the band, until it becomes tense.  Step 1: Squeeze your shoulder blades together. Bending your elbows, draw your hands to your chest, as if you are rowing a boat. At the end of this motion, your hands and elbow should be at shoulder height and your elbows should be out to your sides.  Step 2: Rotate your shoulders, to raise your hands above your head. Your forearms should be vertical and your upper arms should be horizontal.  Hold for __________ seconds. Slowly ease the tension on the band, as you reverse the directions and return to the starting position. Repeat __________ times. Complete this exercise __________ times per day.  STRENGTH  Scapular Depressors  Find a sturdy chair   without wheels, such as a dining room chair.  Keeping your feet on the floor, and your hands on the chair arms, lift your bottom up from the seat, and lock your elbows.  Keeping  your elbows straight, allow gravity to pull your body weight down. Your shoulders will rise toward your ears.  Raise your body against gravity by drawing your shoulder blades down your back, shortening the distance between your shoulders and ears. Although your feet should always maintain contact with the floor, your feet should progressively support less body weight, as you get stronger.  Hold for __________ seconds. In a controlled and slow manner, lower your body weight to begin the next repetition. Repeat __________ times. Complete this exercise __________ times per day.  Document Released: 05/13/2005 Document Revised: 08/05/2011 Document Reviewed: 08/25/2008 [employer] Patient Information 2013 [employer]  

## 2012-05-01 NOTE — Progress Notes (Signed)
  Subjective:    Patient ID: [name], male    DOB: 1960/10/06, 51 y.o.   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 He has  been trialed on Botox injection for the adductors, he has been trialed  on a phenol injections and obturator nerve blocks. These have provided  only minimal relief. We have been increasing on his baclofen slowly  over time and he is currently on 20 mg t.i.d. No excessive sedation. I  talked to his caregiver today who states it is still quite difficult to  get his legs apart for dressing and hygiene activities.  He has had no new medical problems in the interval time. HPI Hip adductor tone still an issue per caregiver No falls R shoulder pain Pain Inventory Average Pain 0 Pain Right Now 0 My pain is n/a  In the last 24 hours, has pain interfered with the following? General activity 0 Relation with others 0 Enjoyment of life 0 What TIME of day is your pain at its worst? n/a Sleep (in general) Good  Pain is worse with: n/a Pain improves with: n/a Relief from Meds: n/a  Mobility ability to climb steps?  no do you drive?  no use a wheelchair needs help with transfers  Function disabled: date disabled   Neuro/Psych bladder control problems bowel control problems weakness  Prior Studies Any changes since last visit?  no  Physicians involved in your care Any changes since last visit?  no   History reviewed. No pertinent family history. History   Social History  . Marital Status: Single    Spouse Name: N/A    Number of Children: N/A  . Years of Education: N/A   Social History Main Topics  . Smoking status: Never Smoker   . Smokeless tobacco: None  . Alcohol Use: None  . Drug Use: None  . Sexually Active: None   Other Topics Concern  . None   Social History Narrative  . None   Past Surgical History  Procedure Date  . Stomach  surgery     as an infant "couldn't keep food down"   Past Medical History  Diagnosis Date  . 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 CEREBRAL PALSY 07/24/2006  . HYPERLIPIDEMIA 07/02/2010  . GASTRIC ULCER ACUTE WITH HEMORRHAGE 07/24/2006  . 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 URINARY INCONTINENCE 02/09/2010   BP 142/53  Pulse 87  Resp 14  Ht 5\' 10"  (1.778 m)  Wt 165 lb (74.844 kg)  BMI 23.68 kg/m2  SpO2 99%     Review of Systems  All other systems reviewed and are negative.       Objective:   Physical Exam  Hip abductor tone Ashworth 1 Right shoulder with decreased external rotation passively 2 minus abduction actively Positive impingement sign Negative hand swelling or tenderness      Assessment & Plan:  1. 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 Right shoulder pain likely impingement syndrome will inject today  Right shoulder injection Patient consented caregiver is here with him from group home Right posterior lateral approach Area marked and prepped with Betadine alcohol and injured with 25-gauge needle 40 mg Depo-Medrol +4 cc of 1% lidocaine injected Patient tolerated procedure well Post procedure instructions given

## 2012-05-05 ENCOUNTER — Telehealth: Payer: Self-pay | Admitting: *Deleted

## 2012-05-05 NOTE — Telephone Encounter (Signed)
Amber with [facility] calling to get a verbal order to continue wound care on Mr. Mcleish.  Verbal order given.  Ileana Ladd

## 2012-06-29 ENCOUNTER — Telehealth: Payer: Self-pay | Admitting: Family Medicine

## 2012-06-29 NOTE — Telephone Encounter (Signed)
Patient has a rash on his arm and she needs to know what type of cream to use on it.

## 2012-06-29 NOTE — Telephone Encounter (Signed)
Spoke with [name] and she advises patient has had this type of rash before . They have ketoconazole, hyrdocortisone, kenolog and retin a creams.  Rash covers are size of a fist, red , has complained with itching but not this morning. Could they use either of these creams on this area.

## 2012-06-29 NOTE — Telephone Encounter (Signed)
Consulted with Dr. Ashley Royalty and he advises rash will need to be seen in order to advise on which cream to use. Appointment scheduled tomorrow AM . Caregiver notified.

## 2012-06-30 ENCOUNTER — Ambulatory Visit (INDEPENDENT_AMBULATORY_CARE_PROVIDER_SITE_OTHER): Payer: Medicare Other | Admitting: Family Medicine

## 2012-06-30 VITALS — BP 149/96 | HR 109 | Temp 97.8°F

## 2012-06-30 DIAGNOSIS — R21 Rash and other nonspecific skin eruption: Secondary | ICD-10-CM | POA: Insufficient documentation

## 2012-06-30 MED ORDER — TRIAMCINOLONE ACETONIDE 0.1 % EX CREA: TOPICAL_CREAM | Freq: Two times a day (BID) | CUTANEOUS | Status: DC

## 2012-06-30 NOTE — Assessment & Plan Note (Signed)
Appears to be some type of contact dermatitis, unsure if this is from irritation by positioning of his arm.  Does not appear fungal in nature and no signs of cellulitis.  Will have them use triamcinolone bid for one week or until rash resolves.  If not improving instructed to return.

## 2012-06-30 NOTE — Progress Notes (Signed)
  Subjective:    Patient ID: [name], male    DOB: 07-25-1960, 52 y.o.   MRN: [id number]  HPI  1. Rash:  Comes in today with complaint of rash to R arm.  Has been present x1 week.  Caregiver thinks it may be from the way he rests his arm on his chair.  Area has been itchy.  Denies pain, drainage, warmth, fever or chills.    Review of Systems Per HPI    Objective:   Physical Exam  Constitutional:       Well appearing, well nourished in motorized wheelchair   Skin:       R arm with erythematous patch extending from elbow to mid way up the upper arm.  There is some dry skin over the area.  There is no warmth, tenderness or induration.           Assessment & Plan:

## 2012-06-30 NOTE — Patient Instructions (Addendum)
Thank you for coming in today, it was good to see you Apply the triamcinolone to the area two times daily for the next 7-10 days until rash resolves.  If not improving after 7-10 days of use return to see me.

## 2012-07-13 ENCOUNTER — Encounter: Payer: Self-pay | Admitting: Family Medicine

## 2012-07-13 ENCOUNTER — Ambulatory Visit (INDEPENDENT_AMBULATORY_CARE_PROVIDER_SITE_OTHER): Payer: Medicare Other | Admitting: Family Medicine

## 2012-07-13 VITALS — BP 135/83 | HR 96 | Ht 70.0 in | Wt 165.0 lb

## 2012-07-13 DIAGNOSIS — E785 Hyperlipidemia, unspecified: Secondary | ICD-10-CM

## 2012-07-13 DIAGNOSIS — Z111 Encounter for screening for respiratory tuberculosis: Secondary | ICD-10-CM

## 2012-07-13 DIAGNOSIS — Z5181 Encounter for therapeutic drug level monitoring: Secondary | ICD-10-CM

## 2012-07-13 DIAGNOSIS — Z23 Encounter for immunization: Secondary | ICD-10-CM

## 2012-07-13 LAB — COMPREHENSIVE METABOLIC PANEL
ALT: 17 U/L (ref 0–53)
Albumin: 4.4 g/dL (ref 3.5–5.2)
CO2: 29 mEq/L (ref 19–32)
Calcium: 9.9 mg/dL (ref 8.4–10.5)
Chloride: 104 mEq/L (ref 96–112)
Creat: 0.54 mg/dL (ref 0.50–1.35)
Potassium: 4.4 mEq/L (ref 3.5–5.3)
Sodium: 142 mEq/L (ref 135–145)
Total Protein: 6.9 g/dL (ref 6.0–8.3)

## 2012-07-13 LAB — LDL CHOLESTEROL, DIRECT: Direct LDL: 118 mg/dL — ABNORMAL HIGH

## 2012-07-13 LAB — CBC
Platelets: 308 10*3/uL (ref 150–400)
RDW: 14.1 % (ref 11.5–15.5)
WBC: 6.7 10*3/uL (ref 4.0–10.5)

## 2012-07-13 NOTE — Progress Notes (Signed)
Patient ID: ELFORD EVILSIZER, male   DOB: 1960-10-19, 52 y.o.   MRN: [id number]. SUBJECTIVE:  RAFFAEL BUGARIN is a 52 y.o. male presenting for his annual checkup.  Rash seen at previous appointment has resolved.  Current Outpatient Prescriptions  Medication Sig Dispense Refill  . baclofen (LIORESAL) 20 MG tablet Take 1 tablet (20 mg total) by mouth 4 (four) times daily.  120 each  5  . divalproex (DEPAKOTE SPRINKLE) 125 MG capsule Take 500 mg by mouth 2 (two) times daily.      . divalproex (DEPAKOTE) 500 MG EC tablet ER 500mg  PO 2 tablets at bedtime per [facility]       . doxycycline (VIBRA-TABS) 100 MG tablet Take 1 tablet (100 mg total) by mouth 2 (two) times daily.  20 tablet  0  . Hydrocortisone Acetate 1 % CREA Apply daily to face       . ketoconazole (NIZORAL) 2 % cream Apply to affected area daily, face, skin folds, groin, underarms  75 g  11  . lansoprazole (PREVACID SOLUTAB) 30 MG disintegrating tablet Take 1 tablet (30 mg total) by mouth daily.  90 tablet  3  . LORazepam (ATIVAN) 1 MG tablet One by mouth q 4 hours as needed       . Multiple Vitamin (MULTIVITAMIN) capsule Take 1 capsule by mouth daily.  30 capsule  11  . risperiDONE (RISPERDAL) 1 MG tablet One by mouth at bedtime       . tolterodine (DETROL LA) 2 MG 24 hr capsule Take 1 capsule (2 mg total) by mouth daily.  90 capsule  3  . tretinoin (RETIN-A) 0.025 % cream Apply topically at bedtime. To face       . triamcinolone cream (KENALOG) 0.1 % Apply topically 2 (two) times daily. Apply to rash on right arm two times per day for up to 10 days or until rash resolves.  Do not use on face, axillae or groin area.  Dispense 1 pound jar.  30 g  12   No current facility-administered medications for this visit.   Allergies: Adhesive   ROS:  Feeling well. No dyspnea or chest pain on exertion. No abdominal pain, change in bowel habits, black or bloody stools. No urinary tract or prostatic symptoms. No neurological  complaints.  OBJECTIVE:  The patient appears well, alert, oriented x 3, in no distress.  BP 135/83  Pulse 96  Ht 5\' 10"  (1.778 m)  Wt 165 lb (74.844 kg)  BMI 23.68 kg/m2 ENT normal.  Neck supple. No adenopathy or thyromegaly. PERLA. Lungs are clear, good air entry, no wheezes, rhonchi or rales. S1 and S2 normal, no murmurs, regular rate and rhythm. Abdomen is soft without tenderness, guarding, mass or organomegaly.Extremities show chronic venous stasis changes without edema, normal peripheral pulses. Neurological is congenital quadriplegia with some contractures but without focal findings.  ASSESSMENT:  healthy adult male  PLAN:  continue current medications, continue current healthy lifestyle patterns and return for routine annual checkups Check CMET, CBC and valproic acid level.  Completed FL2 and paperwork for [facility] [employer]. Advised to have colonoscopy

## 2012-07-14 LAB — VALPROIC ACID LEVEL: Valproic Acid Lvl: 63.6 ug/mL (ref 50.0–100.0)

## 2012-07-15 ENCOUNTER — Ambulatory Visit: Payer: Medicare Other | Admitting: *Deleted

## 2012-07-15 ENCOUNTER — Telehealth: Payer: Self-pay | Admitting: Family Medicine

## 2012-07-15 ENCOUNTER — Ambulatory Visit: Payer: Medicare Other | Admitting: Family Medicine

## 2012-07-15 DIAGNOSIS — Z111 Encounter for screening for respiratory tuberculosis: Secondary | ICD-10-CM

## 2012-07-15 LAB — TB SKIN TEST
Induration: 0 mm
TB Skin Test: NEGATIVE

## 2012-07-15 NOTE — Telephone Encounter (Signed)
Patients caregiver dropped off forms to be filled out for group home.  Please fax to [phone] when completed.

## 2012-07-15 NOTE — Progress Notes (Signed)
Patient in office for PPD reading. Results negative.

## 2012-07-16 NOTE — Telephone Encounter (Signed)
Forms were completed by Dr. Ashley Royalty  and given to caregiver yesterday  at nurse visit.

## 2012-10-03 ENCOUNTER — Ambulatory Visit (INDEPENDENT_AMBULATORY_CARE_PROVIDER_SITE_OTHER): Payer: Medicare Other | Admitting: Family Medicine

## 2012-10-03 ENCOUNTER — Ambulatory Visit: Payer: Medicare Other

## 2012-10-03 VITALS — BP 111/71 | HR 84 | Temp 97.8°F | Resp 16

## 2012-10-03 DIAGNOSIS — R059 Cough, unspecified: Secondary | ICD-10-CM

## 2012-10-03 DIAGNOSIS — R509 Fever, unspecified: Secondary | ICD-10-CM

## 2012-10-03 DIAGNOSIS — R079 Chest pain, unspecified: Secondary | ICD-10-CM

## 2012-10-03 DIAGNOSIS — R05 Cough: Secondary | ICD-10-CM

## 2012-10-03 DIAGNOSIS — G809 Cerebral palsy, unspecified: Secondary | ICD-10-CM

## 2012-10-03 DIAGNOSIS — J209 Acute bronchitis, unspecified: Secondary | ICD-10-CM

## 2012-10-03 DIAGNOSIS — Z593 Problems related to living in residential institution: Secondary | ICD-10-CM

## 2012-10-03 LAB — POCT CBC
Granulocyte percent: 68.3 %G (ref 37–80)
HCT, POC: 40.8 % — AB (ref 43.5–53.7)
MCV: 90 fL (ref 80–97)
Platelet Count, POC: 198 10*3/uL (ref 142–424)
RBC: 4.53 M/uL — AB (ref 4.69–6.13)

## 2012-10-03 MED ORDER — LEVOFLOXACIN 500 MG PO TABS: 500.0000 mg | ORAL_TABLET | Freq: Every day | ORAL | Status: DC

## 2012-10-03 MED ORDER — HYDROCODONE-HOMATROPINE 5-1.5 MG/5ML PO SYRP: 5.0000 mL | ORAL_SOLUTION | Freq: Three times a day (TID) | ORAL | Status: DC | PRN

## 2012-10-03 NOTE — Patient Instructions (Addendum)
Begin Levaquin 1 pill daily for infection  Hold the tretinoin while on the Levaquin.  Hold the multivitamin while on the Levaquin.  Use the cough syrup only when necessary for severe coughing or chest wall discomfort.  Encourage lots of fluids  Return if worse or see his regular physician.

## 2012-10-03 NOTE — Progress Notes (Signed)
Subjective: 52 year old man with a four-day E. or so history of a cough. Last night he began running a fever. He had a high temperature of 102 recorded. He does not smoke. He has cerebral palsy, lives in a group home, and has a caregiver with him. We, but do not know of any prior pneumonia history. Last night he complained that the coughing made it hurts in his chest. He's not been able to bring up a lot of stuff.  Objective: Patient is in his wheelchair. Throat is clear. TMs normal. Neck supple without nodes. Chest is clear to auscultation. No rhonchi rales or wheezes can be heard. He has not had any acute respiratory distress and does not have any tachycardia while here. His heart is regular without murmurs. He coughs occasionally, but it is a fairly weak cough and nonproductive.  Assessment: Cough Fever Chest pains/aches  Plan: We will attempt to get a chest x-ray on him if possible. It may be difficult with him being wheelchair-bound. Check CBC.  [facility] reading (PRIMARY) by  Dr. Alwyn Ren Contorted film due to the cerebral palsy. Lungs appear clear. Right base is a little difficult to view, but the diaphragm is clean so I do not believe that represents an infiltrate. . Results for orders placed in visit on 10/03/12  POCT CBC      Result Value Range   WBC 6.4  4.6 - 10.2 K/uL   Lymph, poc 1.5  0.6 - 3.4   POC LYMPH PERCENT 23.6  10 - 50 %L   MID (cbc) 0.5  0 - 0.9   POC MID % 8.1  0 - 12 %M   POC Granulocyte 4.4  2 - 6.9   Granulocyte percent 68.3  37 - 80 %G   RBC 4.53 (*) 4.69 - 6.13 M/uL   Hemoglobin 12.6 (*) 14.1 - 18.1 g/dL   HCT, POC 72.5 (*) 36.6 - 53.7 %   MCV 90.0  80 - 97 fL   MCH, POC 27.8  27 - 31.2 pg   MCHC 30.9 (*) 31.8 - 35.4 g/dL   RDW, POC 44.0     Platelet Count, POC 198  142 - 424 K/uL   MPV 9.3  0 - 99.8 fL

## 2012-10-05 ENCOUNTER — Encounter: Payer: Self-pay | Admitting: Family Medicine

## 2012-10-28 ENCOUNTER — Telehealth: Payer: Self-pay | Admitting: Family Medicine

## 2012-10-28 NOTE — Telephone Encounter (Signed)
Caregiver would like to speak to the nurse about the patients medications.

## 2012-10-28 NOTE — Telephone Encounter (Signed)
Kelly at patients group home states patient was accidentally given Glimepiride 2mg  last night at [facility].  They called the pharmacist and his CBG was monitored every hour last night and never dropped below 102.  Pt is doing fine, no problems noted last night or today.  Tresa Endo just wanted to let MD know.  Will fwd message to MD.  Radene Ou, CMA

## 2012-10-30 ENCOUNTER — Encounter: Payer: Medicare Other | Attending: Physical Medicine & Rehabilitation

## 2012-10-30 ENCOUNTER — Ambulatory Visit (HOSPITAL_BASED_OUTPATIENT_CLINIC_OR_DEPARTMENT_OTHER): Payer: Medicare Other | Admitting: Physical Medicine & Rehabilitation

## 2012-10-30 ENCOUNTER — Encounter: Payer: Self-pay | Admitting: Physical Medicine & Rehabilitation

## 2012-10-30 VITALS — BP 128/77 | HR 98 | Resp 16 | Ht 70.0 in | Wt 165.0 lb

## 2012-10-30 DIAGNOSIS — M25511 Pain in right shoulder: Secondary | ICD-10-CM

## 2012-10-30 DIAGNOSIS — M25519 Pain in unspecified shoulder: Secondary | ICD-10-CM | POA: Insufficient documentation

## 2012-10-30 DIAGNOSIS — G808 Other cerebral palsy: Secondary | ICD-10-CM

## 2012-10-30 MED ORDER — BACLOFEN 20 MG PO TABS: 20.0000 mg | ORAL_TABLET | Freq: Four times a day (QID) | ORAL | Status: DC

## 2012-10-30 NOTE — Progress Notes (Signed)
  Subjective:    Patient ID: [name], male    DOB: [birth year], 52 y.o.   MRN: [id number] Chief complaint is right shoulder pain HPI No prominent to right shoulder. As per his caregiver, he tends to lean on the right side. This is despite attempts at repositioning.  Hip spasticity stable. No difficulty with hygiene or dressing. Still using pillows between the legs at night. Pain Inventory Average Pain 0 Pain Right Now 0 My pain is no pain  In the last 24 hours, has pain interfered with the following? General activity 0 Relation with others 0 Enjoyment of life 0 What TIME of day is your pain at its worst? no pain Sleep (in general) Good  Pain is worse with: no pain Pain improves with: no pain Relief from Meds: no pain  Mobility use a wheelchair needs help with transfers  Function disabled: date disabled . I need assistance with the following:  feeding, dressing, bathing, toileting, meal prep, household duties and shopping  Neuro/Psych bladder control problems bowel control problems weakness tremor  Prior Studies Any changes since last visit?  no  Physicians involved in your care Any changes since last visit?  no   History reviewed. No pertinent family history. History   Social History  . Marital Status: Single    Spouse Name: N/A    Number of Children: N/A  . Years of Education: N/A   Social History Main Topics  . Smoking status: Never Smoker   . Smokeless tobacco: Never Used  . Alcohol Use: No  . Drug Use: No  . Sexually Active: No   Other Topics Concern  . None   Social History Narrative  . None   Past Surgical History  Procedure Laterality Date  . Stomach surgery      as an infant "couldn't keep food down"   Past Medical History  Diagnosis Date  . 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 CEREBRAL PALSY 07/24/2006  . HYPERLIPIDEMIA 07/02/2010  . GASTRIC ULCER ACUTE WITH HEMORRHAGE 07/24/2006  . 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 URINARY INCONTINENCE  02/09/2010   BP 128/77  Pulse 98  Resp 16  Ht 5\' 10"  (1.778 m)  Wt 165 lb (74.844 kg)  BMI 23.68 kg/m2  SpO2 100%     Review of Systems  Gastrointestinal:       Bowel control  Genitourinary:       Bladder control  Neurological: Positive for tremors.       Objective:   Physical Exam  Right shoulder has decreased internal and external rotation. Right biceps Ashworth grade 3-4 Tenderness over the bicipital groove on the right side. No tenderness over the subacromial area. No tenderness over the trapezius. Ashworth grade 2 spasticity bilateral hip abductors. No pain with hip abduction passively      Assessment & Plan:  1. Cerebral palsy with spastic tetraparesis. Overall needs to continue with baclofen 2. Right shoulder pain with weakness and spasticity. We'll need to do ultrasound of the shoulder to further assess. I suspect he may have biceps tenosynovitis.

## 2012-11-26 ENCOUNTER — Ambulatory Visit (HOSPITAL_BASED_OUTPATIENT_CLINIC_OR_DEPARTMENT_OTHER): Payer: Medicare Other | Admitting: Physical Medicine & Rehabilitation

## 2012-11-26 ENCOUNTER — Encounter: Payer: Self-pay | Admitting: Physical Medicine & Rehabilitation

## 2012-11-26 ENCOUNTER — Encounter: Payer: Medicare Other | Attending: Physical Medicine & Rehabilitation

## 2012-11-26 VITALS — BP 149/84 | HR 86 | Resp 16

## 2012-11-26 DIAGNOSIS — M19019 Primary osteoarthritis, unspecified shoulder: Secondary | ICD-10-CM

## 2012-11-26 DIAGNOSIS — M25519 Pain in unspecified shoulder: Secondary | ICD-10-CM

## 2012-11-26 DIAGNOSIS — M19011 Primary osteoarthritis, right shoulder: Secondary | ICD-10-CM

## 2012-11-26 DIAGNOSIS — G808 Other cerebral palsy: Secondary | ICD-10-CM

## 2012-11-26 DIAGNOSIS — M25511 Pain in right shoulder: Secondary | ICD-10-CM

## 2012-11-26 NOTE — Patient Instructions (Signed)
Right shoulder glenohumeral arthritis No evidence of rotator cuff tears No evidence of bursitis

## 2012-11-26 NOTE — Progress Notes (Signed)
  Subjective:    Patient ID: [name], male    DOB: [birth year], 52 y.o.   MRN: [id number]  HPI Right shoulder pain no trauma. Congenital quadriplegia with severe spasticity Pain Inventory Average Pain na Pain Right Now na My pain is na  In the last 24 hours, has pain interfered with the following? General activity na Relation with others na Enjoyment of life na What TIME of day is your pain at its worst? na Sleep (in general) Good  Pain is worse with: na Pain improves with: na Relief from Meds: na  Mobility ability to climb steps?  no do you drive?  no use a wheelchair needs help with transfers  Function I need assistance with the following:  dressing, bathing, toileting, meal prep, household duties and shopping  Neuro/Psych No problems in this area  Prior Studies Any changes since last visit?  no  Physicians involved in your care Any changes since last visit?  no   History reviewed. No pertinent family history. History   Social History  . Marital Status: Single    Spouse Name: N/A    Number of Children: N/A  . Years of Education: N/A   Social History Main Topics  . Smoking status: Never Smoker   . Smokeless tobacco: Never Used  . Alcohol Use: No  . Drug Use: No  . Sexually Active: No   Other Topics Concern  . None   Social History Narrative  . None   Past Surgical History  Procedure Laterality Date  . Stomach surgery      as an infant "couldn't keep food down"   Past Medical History  Diagnosis Date  . 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 CEREBRAL PALSY 07/24/2006  . HYPERLIPIDEMIA 07/02/2010  . GASTRIC ULCER ACUTE WITH HEMORRHAGE 07/24/2006  . 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 URINARY INCONTINENCE 02/09/2010   BP 149/84  Pulse 86  Resp 16  SpO2 99%     Review of Systems  All other systems reviewed and are negative.       Objective:   Physical Exam        Assessment & Plan:  Complete ultrasound right shoulder  Indication right  shoulder pain  Biceps tendon short axis views no evidence of fluid. Cross section diameter 4.3 mm within normal limits Biceps tendon at groove no evidence of tendon tear 2.7 mm thickness no surrounding fluid Right a.c. joint mild irregularity at the acromion surface. Joint capsule intact long axis view Right supraspinatus tendon long axis view 3.9 mm thickness no evidence of tear, no increase bursal fluid Right supraspinatus tendon short axis views 4.2 mm thickness Right infraspinatus tendon short axis view no evidence of tear, difficult positioning secondary to spasticity Right infraspinatus tendon long axis views no evidence of tear Evidence of irregularity of humeral articular cortex most apparent on long axis view of supraspinatus  Impression: 1. Abnormal study 2. Cortical irregularity right humerus. No joint effusion 3. No evidence of rotator cuff tear however positioning limited secondary to spasticity

## 2013-01-08 ENCOUNTER — Emergency Department (HOSPITAL_COMMUNITY)
Admission: EM | Admit: 2013-01-08 | Discharge: 2013-01-08 | Disposition: A | Discharge status: Home / Self Care | Payer: Medicare Other | Attending: Emergency Medicine | Admitting: Emergency Medicine

## 2013-01-08 ENCOUNTER — Ambulatory Visit (INDEPENDENT_AMBULATORY_CARE_PROVIDER_SITE_OTHER): Payer: Medicare Other | Admitting: Family Medicine

## 2013-01-08 ENCOUNTER — Emergency Department (HOSPITAL_COMMUNITY): Payer: Medicare Other

## 2013-01-08 ENCOUNTER — Encounter (HOSPITAL_COMMUNITY): Payer: Self-pay | Admitting: Emergency Medicine

## 2013-01-08 VITALS — BP 129/90 | HR 98 | Temp 98.6°F

## 2013-01-08 DIAGNOSIS — L02619 Cutaneous abscess of unspecified foot: Secondary | ICD-10-CM | POA: Insufficient documentation

## 2013-01-08 DIAGNOSIS — Z8669 Personal history of other diseases of the nervous system and sense organs: Secondary | ICD-10-CM | POA: Insufficient documentation

## 2013-01-08 DIAGNOSIS — L97909 Non-pressure chronic ulcer of unspecified part of unspecified lower leg with unspecified severity: Secondary | ICD-10-CM

## 2013-01-08 DIAGNOSIS — L8990 Pressure ulcer of unspecified site, unspecified stage: Secondary | ICD-10-CM | POA: Insufficient documentation

## 2013-01-08 DIAGNOSIS — S91309A Unspecified open wound, unspecified foot, initial encounter: Secondary | ICD-10-CM

## 2013-01-08 DIAGNOSIS — L03116 Cellulitis of left lower limb: Secondary | ICD-10-CM

## 2013-01-08 DIAGNOSIS — Z79899 Other long term (current) drug therapy: Secondary | ICD-10-CM | POA: Insufficient documentation

## 2013-01-08 DIAGNOSIS — Z8639 Personal history of other endocrine, nutritional and metabolic disease: Secondary | ICD-10-CM | POA: Insufficient documentation

## 2013-01-08 DIAGNOSIS — L89223 Pressure ulcer of left hip, stage 3: Secondary | ICD-10-CM | POA: Insufficient documentation

## 2013-01-08 DIAGNOSIS — L97929 Non-pressure chronic ulcer of unspecified part of left lower leg with unspecified severity: Secondary | ICD-10-CM

## 2013-01-08 DIAGNOSIS — Z8659 Personal history of other mental and behavioral disorders: Secondary | ICD-10-CM | POA: Insufficient documentation

## 2013-01-08 DIAGNOSIS — Z8719 Personal history of other diseases of the digestive system: Secondary | ICD-10-CM | POA: Insufficient documentation

## 2013-01-08 DIAGNOSIS — Z862 Personal history of diseases of the blood and blood-forming organs and certain disorders involving the immune mechanism: Secondary | ICD-10-CM | POA: Insufficient documentation

## 2013-01-08 DIAGNOSIS — R32 Unspecified urinary incontinence: Secondary | ICD-10-CM | POA: Insufficient documentation

## 2013-01-08 DIAGNOSIS — Z872 Personal history of diseases of the skin and subcutaneous tissue: Secondary | ICD-10-CM | POA: Insufficient documentation

## 2013-01-08 DIAGNOSIS — S91302A Unspecified open wound, left foot, initial encounter: Secondary | ICD-10-CM

## 2013-01-08 LAB — BASIC METABOLIC PANEL
BUN: 12 mg/dL (ref 6–23)
CO2: 28 mEq/L (ref 19–32)
GFR calc non Af Amer: >90 mL/min (ref >90)
Glucose, Bld: 98 mg/dL (ref 70–99)
Potassium: 3.9 mEq/L (ref 3.5–5.1)

## 2013-01-08 LAB — CBC WITH DIFFERENTIAL/PLATELET
Eosinophils Absolute: 0.1 10*3/uL (ref 0.0–0.7)
Hemoglobin: 13.1 g/dL (ref 13.0–17.0)
Lymphocytes Relative: 20 % (ref 12–46)
Lymphs Abs: 1.7 10*3/uL (ref 0.7–4.0)
MCH: 28.4 pg (ref 26.0–34.0)
MCV: 84 fL (ref 78.0–100.0)
Monocytes Relative: 7 % (ref 3–12)
Neutrophils Relative %: 73 % (ref 43–77)
RBC: 4.62 MIL/uL (ref 4.22–5.81)
WBC: 8.5 10*3/uL (ref 4.0–10.5)

## 2013-01-08 MED ORDER — SODIUM CHLORIDE 0.9 % IV BOLUS (SEPSIS)
1000.0000 mL | Freq: Once | INTRAVENOUS | Status: AC
  Administered 2013-01-08: 1000 mL via INTRAVENOUS

## 2013-01-08 MED ORDER — GADOBENATE DIMEGLUMINE 529 MG/ML IV SOLN
15.0000 mL | Freq: Once | INTRAVENOUS | Status: AC | PRN
  Administered 2013-01-08: 15 mL via INTRAVENOUS

## 2013-01-08 NOTE — ED Notes (Signed)
Was sent for eval of left foot wound that does not seem to be healing well

## 2013-01-08 NOTE — Assessment & Plan Note (Signed)
Precepted and examined with Dr. Lum Babe. Advised pt to proceed to the ED for consideration of lab work, imaging, etc. Uncertain if injury to toes/foot itself is directly related to redness/swelling and ulceration of lower leg. Ulceration over medial malleolus with surrounding erythema could possibly be secondary to chronic venous insufficiency, but could also be cellulitic. Injury under toes appears deep and does not appear to have an abscess, though exam is limited due to swelling and pt's ability to move limb. Considered continuing outpt doxycycline with close f/u next week, but would like foot wound evaluated before then. If NOT admitted, advised close follow up next week. Red flags reviewed, including fever/chills, worse swelling/pain, development of SOB, etc.

## 2013-01-08 NOTE — ED Notes (Signed)
Patient transported to MRI 

## 2013-01-08 NOTE — ED Notes (Signed)
Pt moved from him wheelchair to the stretcher. Has ulcer to left medial side of foot. Skin is shiny, red and tender to touch. Cut to bottom of foot under 3-5th toe. Foot is warm to touch, sensation is intact.

## 2013-01-08 NOTE — Progress Notes (Signed)
  Subjective:    Patient ID: [name], male    DOB: [birth year], 52 y.o.   MRN: [id number]  HPI: Pt presents to clinic for f/u of "foot cut" on his left foot. Pt is wheelchair bound due to congenital quadriplegia/cerebral palsy and sits with his feet propped at end of foot-board, and per cargiver "doesn't watch where he's going very well" and "runs into things all the time." Sometime earlier this week, 4-5 days ago, pt thinks he ran into something and injured his left foot; his third, fourth, and fifth toes had a "cut" under them and he went to urgent care. He had pain, redness, and swelling in the area and was given a tetanus shot and prescribed doxycycline for 10 days, which he started two days ago. The pain and swelling has been better. He does have a healing ulceration over his left medial malleolus with redness around it, and his left lower leg is swollen more than the right, but his caregiver states, "his legs and feet, swell up and down all the time; this looks better than it did."  Review of Systems: As above. Denies fever/chills, difficulty breathing, bleeding/drainage from the wound or the ulcer on his lower left leg. Denies chest pain, cough, N/V. Pt is incontinent of urine at baseline.     Objective:   Physical Exam BP 129/90  Pulse 98  Temp(Src) 98.6 F (37 C) (Oral) Gen: adult male, wheel-chair bound, in no acute distress; able to answer questions appropriately, though does have some mental delay Pulm: CTAB, no increased WOB, appears comfortable on room air Cardio: RRR, no murmur appreciated Ext/Neuro: non-focal exam, global weakness with some contractures; left foot dressing in place from UC visit 4 days ago, no active bleeding/drainage  Left foot with 2+ edema of foot, ankle, and calf, no calf tenderness  Large ulceration over left medial malleolus, yellowish, covered, without definite fluctuance but with possible fluid collection beneath skin  Ulceration itself is not  greatly tender  Redness surrounding ulceration with some warmth compared to surrounding skin, consistent with possible chronic changes of venous insufficiency  Left third, fourth, and fifth toes with some redness and apparent laceration/healing skin tear in fold between plantar surface of toes where they meet the foot  Dried blood between toes and some scant serous material partially dried sticking between toes  Mild tenderness with manipulating toes, but distal sensation and capillary refill is intact     Assessment & Plan:  Precepted and examined with Dr. Lum Babe. Advised pt to proceed to the ED for consideration of lab work, imaging, etc. Uncertain if injury to toes/foot itself is directly related to redness/swelling and ulceration of lower leg. Ulceration over medial malleolus with surrounding erythema could possibly be secondary to chronic venous insufficiency, but could also be cellulitic. Injury under toes appears deep and does not appear to have an abscess, though exam is limited due to swelling and pt's ability to move limb. Considered continuing outpt doxycycline with close f/u next week, but would like foot wound evaluated before then. If NOT admitted, advised close follow up next week. Red flags reviewed, including fever/chills, worse swelling/pain, development of SOB, etc.

## 2013-01-08 NOTE — Patient Instructions (Addendum)
Thank you for coming in, today! I'm sorry you had an injury to your foot. We want you to go to the emergency department to be checked out. We will be able to see their notes and tests in our system. IF they don't admit you to the hospital:  Make an appointment to come back to see someone here early next week.  Keep taking any antibiotics you're prescribed. If you start running fevers, having chills, having nausea/vomiting, or if you have worse swelling, redness, or pain over the weekend, go to back to the emergency room, if you are not admitted. Please feel free to call with any questions or concerns at any time, at [phone]. --Dr. Casper Harrison  ED Providers: Consider lab draw for CBC, BMP, foot xray or advanced imaging. Could also consider wound care/ortho consult.

## 2013-01-08 NOTE — Assessment & Plan Note (Addendum)
Over left medial malleolus with surrounding redness, swelling, warmth. Possibly due to chronic venous insufficiency, though cellulitic component is possible, as well. See other problem list note from this visit, "left foot wound and infection."

## 2013-01-08 NOTE — ED Notes (Signed)
Called MRI, reporting 2 patients in front of patient. Pt group home supervisor arrived, [place]. Pt is resting, requesting something to eat. Told not until MRI results.

## 2013-01-08 NOTE — ED Provider Notes (Signed)
CSN: [id number]     Arrival date & time 01/08/13  1150 History     First MD Initiated Contact with Patient 01/08/13 1208     Chief Complaint  Patient presents with  . Foot Pain   (Consider location/radiation/quality/duration/timing/severity/associated sxs/prior Treatment) HPI  52 year old male refer to the emergency room for further evaluation of left lower extremity wounds and cellulitis. Patient has a history of cerebral palsy. He is essentially insensate in the affected areas. He thinks he may have struck his foot against something as he was getting around in his wheelchair. He cannot remember any specific trauma though. First noticed wound to foot about a week ago? Redness/swelling developing after this.  He was evaluated at an urgent care center and started on doxycycline for cellulitis on Wednesday. He followed up with his family doctor today. Per report, the swelling and redness has improved since starting the antibiotics. No recorded fever. No n/v.     Past Medical History  Diagnosis Date  . 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 CEREBRAL PALSY 07/24/2006  . HYPERLIPIDEMIA 07/02/2010  . GASTRIC ULCER ACUTE WITH HEMORRHAGE 07/24/2006  . 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 URINARY INCONTINENCE 02/09/2010   Past Surgical History  Procedure Laterality Date  . Stomach surgery      as an infant "couldn't keep food down"   No family history on file. History  Substance Use Topics  . Smoking status: Never Smoker   . Smokeless tobacco: Never Used  . Alcohol Use: No    Review of Systems  All systems reviewed and negative, other than as noted in HPI.   Allergies  Adhesive  Home Medications   Current Outpatient Rx  Name  Route  Sig  Dispense  Refill  . baclofen (LIORESAL) 20 MG tablet   Oral   Take 1 tablet (20 mg total) by mouth 4 (four) times daily.   120 each   5   . divalproex (DEPAKOTE SPRINKLE) 125 MG capsule   Oral   Take 125 mg by mouth. 4 po q am, 4 q pm         .  DOXYCYCLINE HYCLATE PO   Oral   Take 100 mg by mouth 2 (two) times daily.         Marland Kitchen HYDROcodone-homatropine (HYCODAN) 5-1.5 MG/5ML syrup   Oral   Take 5 mLs by mouth every 8 (eight) hours as needed for cough.   120 mL   0   . Hydrocortisone Acetate 1 % CREA      Apply daily to face          . ketoconazole (NIZORAL) 2 % cream      Apply to affected area daily, face, skin folds, groin, underarms   75 g   11   . lansoprazole (PREVACID SOLUTAB) 30 MG disintegrating tablet   Oral   Take 1 tablet (30 mg total) by mouth daily.   90 tablet   3   . levofloxacin (LEVAQUIN) 500 MG tablet   Oral   Take 1 tablet (500 mg total) by mouth daily.   7 tablet   0   . LORazepam (ATIVAN) 1 MG tablet      One by mouth q 4 hours as needed          . EXPIRED: Multiple Vitamin (MULTIVITAMIN) capsule   Oral   Take 1 capsule by mouth daily.   30 capsule   11   . risperiDONE (RISPERDAL) 1 MG tablet  One by mouth at bedtime          . tolterodine (DETROL LA) 2 MG 24 hr capsule   Oral   Take 1 capsule (2 mg total) by mouth daily.   90 capsule   3   . tretinoin (RETIN-A) 0.025 % cream   Topical   Apply topically at bedtime. To face          . triamcinolone cream (KENALOG) 0.1 %   Topical   Apply topically 2 (two) times daily. Apply to rash on right arm two times per day for up to 10 days or until rash resolves.  Do not use on face, axillae or groin area.  Dispense 1 pound jar.   30 g   12    BP 145/95  Pulse 90  Temp(Src) 97.6 F (36.4 C) (Oral)  Resp 24  SpO2 94% Physical Exam  Nursing note and vitals reviewed. Constitutional: He appears well-developed and well-nourished. No distress.  Laying in bed. Chronically ill appearing with contractures. No acute distress.   HENT:  Head: Normocephalic and atraumatic.  Eyes: Conjunctivae are normal. Right eye exhibits no discharge. Left eye exhibits no discharge.  Neck: Neck supple.  Cardiovascular: Normal rate,  regular rhythm and normal heart sounds.  Exam reveals no gallop and no friction rub.   No murmur heard. Pulmonary/Chest: Effort normal and breath sounds normal. No respiratory distress.  Abdominal: Soft. He exhibits no distension. There is no tenderness.  Musculoskeletal: He exhibits no edema and no tenderness.  General muscle atrophy and contractures. Swelling of L foot extending to mid tib/fib. Erythema and increased warmth. Scabbed ulceration to  left medial malleolus. No fluctuance. No drainage. Lacerations to web spaces of toes 3/4 and 4/5 L foot. No active bleeding. No purulence/drainage. Increased erythema and warmth of toes 4/5. Small wound with hard black eschar dorsal aspect 4th toes. Onchymycosis. Pt c/o "tickling" when touching feet/toes.   Neurological: He is alert.  Speech slow and hesitant. Does answer basic question.   Skin: Skin is warm and dry.  Psychiatric: He has a normal mood and affect. His behavior is normal. Thought content normal.    ED Course   Procedures (including critical care time)  Labs Reviewed  CBC WITH DIFFERENTIAL - Abnormal; Notable for the following:    HCT 38.8 (*)    All other components within normal limits  BASIC METABOLIC PANEL   Dg Foot Complete Left  01/08/2013   *RADIOLOGY REPORT*  Clinical Data: Left foot swelling and redness  LEFT FOOT - COMPLETE 3+ VIEW  Comparison: 08/03/2007  Findings: Three views of the left foot submitted.  There is diffuse osteopenia.  Again noted flat foot deformity.  Again noted prior fusion of the talus with calcaneus with a metallic fixation screw. There is soft tissue swelling fourth and fifth toe.  Soft tissue irregularity noted between the fourth and fifth toe.  Degenerative changes first metatarsal phalangeal joint and interphalangeal joint great toe.  There is cortical irregularity at the base of the proximal phalanx fourth toe.  Nondisplaced fracture or osteomyelitis cannot be excluded.  Clinical correlation is  necessary.  Further evaluation with MRI is recommended.  IMPRESSION: There is diffuse osteopenia.  Again noted flat foot deformity. Again noted prior fusion of the talus with calcaneus with a metallic fixation screw. There is soft tissue swelling fourth and fifth toe.  Soft tissue irregularity noted between the fourth and fifth toe.  Degenerative changes first metatarsal phalangeal joint and interphalangeal  joint great toe.  There is cortical irregularity at the base of the proximal phalanx fourth toe. Nondisplaced fracture or osteomyelitis cannot be excluded. Clinical correlation is necessary.  Further evaluation with MRI is recommended.   Original Report Authenticated By: Natasha Mead, M.D.   1. Cellulitis of left foot     MDM  52 year old male sent for further evaluation of LLE wounds/cellulitis. He does have a fairly deep appearing laceration of web space toes 4/5 and to lesser extent between 3/4. He has associated cellulitic changes around these. Patient also has a more chronic appearing ulceration to the medial malleolus. Per history, his redness and swelling have been improving. History/exam is limited though secondary to quadriplegia and also some MR. Although clinically seems to be improving with doxy, diagnosis of osteomyelitis would change therapy in at least regards to duration. Will XR.  XR per above. Will MR to further evaluate. Disposition/further care pending MR results.    PCP: Dr. Maryjean Ka [facility].   Raeford Razor, MD 01/08/13 [phone]

## 2013-01-13 ENCOUNTER — Encounter: Payer: Self-pay | Admitting: Family Medicine

## 2013-01-13 ENCOUNTER — Ambulatory Visit (INDEPENDENT_AMBULATORY_CARE_PROVIDER_SITE_OTHER): Payer: Medicare Other | Admitting: Family Medicine

## 2013-01-13 VITALS — BP 135/80 | HR 96 | Temp 98.3°F

## 2013-01-13 DIAGNOSIS — Z5189 Encounter for other specified aftercare: Secondary | ICD-10-CM

## 2013-01-13 DIAGNOSIS — B351 Tinea unguium: Secondary | ICD-10-CM | POA: Insufficient documentation

## 2013-01-13 DIAGNOSIS — G808 Other cerebral palsy: Secondary | ICD-10-CM

## 2013-01-13 DIAGNOSIS — S8290XS Unspecified fracture of unspecified lower leg, sequela: Secondary | ICD-10-CM

## 2013-01-13 DIAGNOSIS — S92502B Displaced unspecified fracture of left lesser toe(s), initial encounter for open fracture: Secondary | ICD-10-CM | POA: Insufficient documentation

## 2013-01-13 DIAGNOSIS — G809 Cerebral palsy, unspecified: Secondary | ICD-10-CM

## 2013-01-13 DIAGNOSIS — S91302D Unspecified open wound, left foot, subsequent encounter: Secondary | ICD-10-CM

## 2013-01-13 DIAGNOSIS — S92502S Displaced unspecified fracture of left lesser toe(s), sequela: Secondary | ICD-10-CM

## 2013-01-13 DIAGNOSIS — F79 Unspecified intellectual disabilities: Secondary | ICD-10-CM

## 2013-01-13 DIAGNOSIS — L97909 Non-pressure chronic ulcer of unspecified part of unspecified lower leg with unspecified severity: Secondary | ICD-10-CM

## 2013-01-13 DIAGNOSIS — L97929 Non-pressure chronic ulcer of unspecified part of left lower leg with unspecified severity: Secondary | ICD-10-CM

## 2013-01-13 DIAGNOSIS — G589 Mononeuropathy, unspecified: Secondary | ICD-10-CM

## 2013-01-13 DIAGNOSIS — G629 Polyneuropathy, unspecified: Secondary | ICD-10-CM | POA: Insufficient documentation

## 2013-01-13 MED ORDER — DOXYCYCLINE HYCLATE 100 MG PO TABS: 100.0000 mg | ORAL_TABLET | Freq: Two times a day (BID) | ORAL | Status: DC

## 2013-01-13 NOTE — Assessment & Plan Note (Signed)
Related to cerebral palsy, primary need for wheelchair dependency. Pt does not have upper body strength to propel himself in a manual wheelchair. Pt requires assistance for all transfers.

## 2013-01-13 NOTE — Assessment & Plan Note (Signed)
Over left medial malleolus with surrounding redness, swelling. Consistent with chronic venous insufficiency ulcer. Appears improved from visit 8/15, with less LE swelling overall. Elevate feet, local hygiene/skin care. Follow up PRN.

## 2013-01-13 NOTE — Progress Notes (Signed)
  Subjective:    Patient ID: [name], male    DOB: 11/02/1960, 52 y.o.   MRN: [id number]  HPI: Pt is a 52yo male who presents for follow-up of a recent left foot injury and cellulitis. Pt has a history of cerebral palsy, mental retardation, congenital quadriplegia, and neuropathy. Pt presented to Urgent Care 8/11 after running into something with his left foot (pt is confined to power wheelchair and often sits with his toes propped out in front of him and, per caregiver, "runs into stuff all the time"), was started on doxycycline (started 8/13), then presented to clinic 8/15 with continued redness, swelling and pain in the foot; pt felt at that time to have cellulitis and open deep wound of the left 3rd-4th and 4th-5th webspaces on the plantar surface of his foot. Pt was referred to the ED for concern for possible need for IV antibiotics and/or admission. In the ED pt had an xray and MRI which showed open, minimally displaced fracture of the fourth proximal toe phalanx. Ortho was consulted in the ED and recommended local wound care/hygiene, warm compresses, and continued doxycycline. Since then, pt's foot pain and redness/swelling have improved and he "feels much better." Denies fever/chills, abdominal pain, nausea/vomiting.  See also below: Pt requires repair of his motorized wheelchair.  Review of Systems: As above. Generally feels well.     Objective:   Physical Exam BP 135/80  Pulse 96  Temp(Src) 98.3 F (36.8 C) (Oral) Gen: adult male, wheel-chair bound, in no acute distress; able to answer questions appropriately, though does have some mental delay  Pulm: CTAB, no increased WOB, appears comfortable on room air  Cardio: RRR, no murmur appreciated  Ext/Neuro: non-focal exam, global weakness with some contractures  Left foot with 1+ edema of foot, ankle, and calf, no calf tenderness, improved from prior exam  Large ulceration over left medial malleolus, covered with scar, without  fluctuance or drainage  Ulceration itself nontender, mild redness around ulceration, also improved from previous exam  Redness surrounding ulceration with some warmth compared to surrounding skin, consistent with possible chronic changes of venous insufficiency   Small amount of eschar over healing wound between 3rd-4th and 4th-5th left toe plantar webspaces, no frank bleeding or drainage  Overall, wound is healing and exam is improved compared to 8/15 Feet: pt has diffuse callosities to bilateral feet, scabs to few toes on both feet, and numerous thickened, discolored, peeling nails consistent with onychomycoses     Assessment & Plan:  See problem list notes.  Additionally: Pt has a powered wheelchair which is in need of repair. DME form signed by attending physician Dr. Jennette Kettle. I, Maryjean Ka, MD certify that this patient is under my care and that I had a face-to-face encounter that meets the physician face-to-face encounter requirements with this patient on 01/13/2013. 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

## 2013-01-13 NOTE — Assessment & Plan Note (Signed)
Evaluated by orthopedic surgery in the ED 8/15; per caregiver, no specific therapy recommended at that time (no surgery or splinting). Continue supportive care, treatment with doxycycline for cellulitis, which is improving/resolved. Follow up as needed.

## 2013-01-13 NOTE — Patient Instructions (Signed)
Thank you for coming in, today! [name]'s foot is healing well. He should finish out his current prescription of doxycycline. Once he is done with those pills, he should take another 10 days, which I will prescribe today. I would like him to be seen by podiatry for his toenails and foot callouses. If his foot looks like it is getting worse instead of better, come back to see me. If his foot continues to do well, make an appointment as he needs for any new issues. Please feel free to call with any questions or concerns at any time, at [phone]. --Dr. Casper Harrison

## 2013-01-13 NOTE — Assessment & Plan Note (Signed)
Related to cerebral palsy and congenital quadriplegia. Pt is unable to ambulate and requires wheelchair for mobility as well as assistance with all transfers.

## 2013-01-13 NOTE — Assessment & Plan Note (Signed)
Primary need for wheelchair dependency. Pt does not have upper body strength to propel himself in a manual wheelchair. Pt requires assistance for all transfers.

## 2013-01-13 NOTE — Assessment & Plan Note (Signed)
Related to cerebral palsy and spastic quadriplegia. Pt requires assistance with ADLs and is a resident in a supervised facility but uses motorized wheelchair for some independence in ambulation.

## 2013-01-13 NOTE — Assessment & Plan Note (Signed)
Multiple toes of bilateral feet, also with scattered callosities and scabs from various minor injuries. Referred to podiatry (has seen pt in the past for similar issues). Follow up as needed.

## 2013-01-13 NOTE — Assessment & Plan Note (Signed)
A: Healing foot cellulitis secondary to open wound of left foot in the 3rd-4th and 4th-5th plantar webspaces. Malleolar ulcer consistent with chronic venous stasis, but also appears improved. Pt is nearly complete with first prescription of 10 days of doxycycline. Evaluated in the ED 8/15, no osteomyelitis noted on MRI.  P: Continue current prescription of doxycycline, plus an additional 10 days to complete 20 days antibiotics. Follow up PRN.

## 2013-02-25 ENCOUNTER — Encounter: Payer: Medicare Other | Attending: Physical Medicine & Rehabilitation

## 2013-02-25 ENCOUNTER — Ambulatory Visit (HOSPITAL_BASED_OUTPATIENT_CLINIC_OR_DEPARTMENT_OTHER): Payer: Medicare Other | Admitting: Physical Medicine & Rehabilitation

## 2013-02-25 ENCOUNTER — Encounter: Payer: Self-pay | Admitting: Physical Medicine & Rehabilitation

## 2013-02-25 DIAGNOSIS — M19019 Primary osteoarthritis, unspecified shoulder: Secondary | ICD-10-CM

## 2013-02-25 DIAGNOSIS — G808 Other cerebral palsy: Secondary | ICD-10-CM | POA: Insufficient documentation

## 2013-02-25 DIAGNOSIS — M25519 Pain in unspecified shoulder: Secondary | ICD-10-CM | POA: Insufficient documentation

## 2013-02-25 NOTE — Progress Notes (Signed)
  Subjective:    Patient ID: [name], male    DOB: 1960-07-17, 52 y.o.   MRN: [id number]  HPI Pain Inventory Average Pain 1 Pain Right Now 0 My pain is na  In the last 24 hours, has pain interfered with the following? General activity 0 Relation with others 0 Enjoyment of life 0 What TIME of day is your pain at its worst? na Sleep (in general) Good  Pain is worse with: na Pain improves with: na Relief from Meds: na  Mobility ability to climb steps?  no do you drive?  no use a wheelchair needs help with transfers  Function I need assistance with the following:  dressing, bathing, toileting, meal prep, household duties and shopping  Neuro/Psych No problems in this area  Prior Studies Any changes since last visit?  no  Physicians involved in your care Any changes since last visit?  no   History reviewed. No pertinent family history. History   Social History  . Marital Status: Single    Spouse Name: N/A    Number of Children: N/A  . Years of Education: N/A   Social History Main Topics  . Smoking status: Never Smoker   . Smokeless tobacco: Never Used  . Alcohol Use: No  . Drug Use: No  . Sexual Activity: No   Other Topics Concern  . None   Social History Narrative  . None   Past Surgical History  Procedure Laterality Date  . Stomach surgery      as an infant "couldn't keep food down"   Past Medical History  Diagnosis Date  . 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 CEREBRAL PALSY 07/24/2006  . HYPERLIPIDEMIA 07/02/2010  . GASTRIC ULCER ACUTE WITH HEMORRHAGE 07/24/2006  . 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 URINARY INCONTINENCE 02/09/2010   BP 111/75  Pulse 75  Resp 14  SpO2 98%     Review of Systems  All other systems reviewed and are negative.       Objective:   Physical Exam        Assessment & Plan:  Shoulder injection Right   Indication:Right Shoulder pain not relieved by medication management and other conservative care.  Informed  consent was obtained after describing risks and benefits of the procedure with the patient, this includes bleeding, bruising, infection and medication side effects. The patient wishes to proceed and has given written consent. Patient was placed in a seated position. The Right shoulder was marked and prepped with betadine in the subacromial area. A 25-gauge 1-1/2 inch needle was inserted into the subacromial area. After negative draw back for blood, a solution containing 1 mL of 6 mg per ML celestone and 3 mL of 1% lidocaine was injected. A band aid was applied. The patient tolerated the procedure well. Post procedure instructions were given.

## 2013-02-25 NOTE — Patient Instructions (Signed)
No restrictions for your shoulder. We may need to repeat the injection in 3 months

## 2013-02-26 ENCOUNTER — Ambulatory Visit: Payer: Medicare Other | Admitting: Physical Medicine & Rehabilitation

## 2013-03-09 ENCOUNTER — Ambulatory Visit (INDEPENDENT_AMBULATORY_CARE_PROVIDER_SITE_OTHER): Payer: Medicare Other

## 2013-03-09 VITALS — BP 120/75 | HR 85 | Resp 16 | Ht 70.0 in | Wt 150.0 lb

## 2013-03-09 DIAGNOSIS — L02419 Cutaneous abscess of limb, unspecified: Secondary | ICD-10-CM

## 2013-03-09 DIAGNOSIS — S91109A Unspecified open wound of unspecified toe(s) without damage to nail, initial encounter: Secondary | ICD-10-CM

## 2013-03-09 DIAGNOSIS — R609 Edema, unspecified: Secondary | ICD-10-CM

## 2013-03-09 DIAGNOSIS — S90121S Contusion of right lesser toe(s) without damage to nail, sequela: Secondary | ICD-10-CM

## 2013-03-09 DIAGNOSIS — S91119A Laceration without foreign body of unspecified toe without damage to nail, initial encounter: Secondary | ICD-10-CM

## 2013-03-09 DIAGNOSIS — L03116 Cellulitis of left lower limb: Secondary | ICD-10-CM

## 2013-03-09 MED ORDER — SILVER SULFADIAZINE 1 % EX CREA: TOPICAL_CREAM | Freq: Every day | CUTANEOUS | Status: DC

## 2013-03-09 MED ORDER — CEPHALEXIN 500 MG PO CAPS: 500.0000 mg | ORAL_CAPSULE | Freq: Three times a day (TID) | ORAL | Status: DC

## 2013-03-09 NOTE — Progress Notes (Signed)
  Subjective:    Patient ID: [name], male    DOB: 04/22/1961, 52 y.o.   MRN: [id number]  "He has a place on his foot that's been bleeding and he's had some swelling."   There is a dorsal laceration over the hallux IP joint on the right great toe secondary contusion or trauma to the nail there is also dystrophy and trauma to the right hallux nail plate with dried blood in the scar tissue the nailbed been Left foot demonstrates edema around the ankle +1+2 pitting with some slight area of erythema and localized cellulitis medial malleoli area possibly secondary to irritation or contusion. Patient is a power wheelchair nonambulatory secondary CP  HPI Comments: N  Bleeding, stings L  Ulcer hallux rt. D  2-3 weeks O suddenly C  Gotten little better A  Hit it T  Dressing, ointment      Review of Systems  Constitutional: Negative.   HENT: Positive for ear discharge.   Eyes: Negative.   Respiratory: Negative.   Cardiovascular: Negative.   Gastrointestinal: Negative.   Endocrine: Positive for cold intolerance.  Genitourinary: Negative.   Musculoskeletal: Positive for gait problem.  Skin: Positive for wound.  Allergic/Immunologic: Negative.   Neurological: Positive for speech difficulty.  Hematological: Bruises/bleeds easily.  Psychiatric/Behavioral: The patient is nervous/anxious.        Objective:   Physical Exam  Constitutional: He appears well-nourished.  Cardiovascular:  Pulses:      Dorsalis pedis pulses are 2+ on the right side, and 1+ on the left side.       Posterior tibial pulses are 1+ on the right side, and 0 on the left side.  +2 edema left foot and ankle area. Capillary refill time 3-5 seconds all digits  Musculoskeletal:  Patient is nonambulatory has changes in foot shape secondary CP with digital contractures and ankle contractures. Left more so than right  Neurological: He displays atrophy and abnormal reflex. He exhibits abnormal muscle tone.  Patient  does have abnormal tone is function secondary CP abnormal DTRs. There is decreased or abnormal epicritic sensation noted bilateral  Skin: Skin is warm and dry. No cyanosis. Nails show no clubbing.  Right hallux nail shows kyphosis discoloration dried blood secondary to history of contusion injury. There is also a laceration with some fresh blood over the IP joint of the right hallux this is cleansed and dressed. Multiple contusions and lacerations noted on lateral digits and ankle area. Left foot demonstrates erythema and abrasion medial malleoli are area.  Hair growth absent bilateral temperature warm to cool          Assessment & Plan:  CP with neurologic manifestations and deformities. They're contusions and injuries to both feet possible arthrosis and cellulitis medial left ankle and contusion to right hallux with laceration and nail dystrophy. The laceration contusions are debrided and cleansed Silvadene and gauze dressing applied to right hallux prescription for Silvadene given and recommendations for daily dressing changes given . Prescription for antibiotic for suspect cellulitis left foot given followup in 2-3 weeks if it fails to resolve  Alvan Dame DPM

## 2013-03-09 NOTE — Patient Instructions (Signed)
ANTIBACTERIAL SOAP INSTRUCTIONS  THE DAY AFTER PROCEDURE  Please follow the instructions your doctor has marked.   Shower as usual. Before getting out, place a drop of antibacterial liquid soap (Dial) on a wet, clean washcloth.  Gently wipe washcloth over affected area.  Afterward, rinse the area with warm water.  Blot the area dry with a soft cloth and cover with antibiotic ointment (neosporin, polysporin, bacitracin) and band aid or gauze and tape  Place 3-4 drops of antibacterial liquid soap in a quart of warm tap water.  Submerge foot into water for 20 minutes.  If bandage was applied after your procedure, leave on to allow for easy lift off, then remove and continue with soak for the remaining time.  Next, blot area dry with a soft cloth and cover with a bandage.  Apply other medications as directed by your doctor, such as cortisporin otic solution (eardrops) or neosporin antibiotic ointment  After cleansing apply Silvadene and gauze dressing as instructed

## 2013-03-30 ENCOUNTER — Ambulatory Visit: Payer: Medicare Other | Admitting: Physical Medicine & Rehabilitation

## 2013-03-31 ENCOUNTER — Ambulatory Visit: Payer: Medicare Other

## 2013-04-08 ENCOUNTER — Other Ambulatory Visit: Payer: Self-pay | Admitting: Family Medicine

## 2013-04-08 MED ORDER — TOLTERODINE TARTRATE ER 2 MG PO CP24: 2.0000 mg | ORAL_CAPSULE | Freq: Every day | ORAL | Status: DC

## 2013-04-09 ENCOUNTER — Other Ambulatory Visit: Payer: Self-pay | Admitting: Family Medicine

## 2013-04-09 ENCOUNTER — Telehealth: Payer: Self-pay | Admitting: *Deleted

## 2013-04-09 MED ORDER — TRETINOIN 0.025 % EX GEL: 1.0000 | Freq: Every day | CUTANEOUS | Status: DC

## 2013-04-09 MED ORDER — TRIAMCINOLONE ACETONIDE 0.1 % EX CREA: 1.0000 "application " | TOPICAL_CREAM | Freq: Two times a day (BID) | CUTANEOUS | Status: DC

## 2013-04-09 MED ORDER — HYDROCORTISONE ACETATE 1 % EX CREA: 1.0000 "application " | TOPICAL_CREAM | Freq: Every day | CUTANEOUS | Status: DC

## 2013-04-09 MED ORDER — SILVER SULFADIAZINE 1 % EX CREA: TOPICAL_CREAM | Freq: Every day | CUTANEOUS | Status: DC

## 2013-04-09 NOTE — Telephone Encounter (Signed)
eRx for triamcinolone, hydrocortisone, and tretinoin.  --CMS

## 2013-04-09 NOTE — Telephone Encounter (Signed)
Received fax request for refill Silvadene Cream. Dr. Jake Seats refill once, pt needs to be seen prior to future refills.

## 2013-05-31 ENCOUNTER — Ambulatory Visit: Payer: Medicare Other | Admitting: Physical Medicine & Rehabilitation

## 2013-06-15 ENCOUNTER — Telehealth: Payer: Self-pay | Admitting: Family Medicine

## 2013-06-15 NOTE — Telephone Encounter (Signed)
Caregiver dropped off papers to be filled out for funding for equipment on patients wheelchair.  Please call her when completed.

## 2013-06-16 NOTE — Telephone Encounter (Signed)
Form placed in Dr. Timothy LassoStreet's box.  Keilan Nichol, Darlyne RussianKristen L, CMA

## 2013-06-17 NOTE — Telephone Encounter (Signed)
Form completed; will have attending over-sign just in case as this is for DME and pt reportedly has some Medicare services. Otherwise, form to be given to K. [place], CMA for caregiver to pick up at her convenience. --CMS

## 2013-06-17 NOTE — Telephone Encounter (Signed)
Darl HouseholderKelley Weekley called [phone], to pick up form.  Placed up fornt.  Kendon Sedeno, Darlyne RussianKristen L, CMA

## 2013-06-24 ENCOUNTER — Encounter: Payer: Self-pay | Admitting: Family Medicine

## 2013-06-24 ENCOUNTER — Ambulatory Visit (INDEPENDENT_AMBULATORY_CARE_PROVIDER_SITE_OTHER): Payer: Medicare Other | Admitting: Family Medicine

## 2013-06-24 VITALS — BP 137/95 | HR 77 | Temp 97.8°F

## 2013-06-24 DIAGNOSIS — G629 Polyneuropathy, unspecified: Secondary | ICD-10-CM

## 2013-06-24 DIAGNOSIS — G808 Other cerebral palsy: Secondary | ICD-10-CM

## 2013-06-24 DIAGNOSIS — G809 Cerebral palsy, unspecified: Secondary | ICD-10-CM

## 2013-06-24 DIAGNOSIS — F79 Unspecified intellectual disabilities: Secondary | ICD-10-CM

## 2013-06-24 DIAGNOSIS — K270 Acute peptic ulcer, site unspecified, with hemorrhage: Secondary | ICD-10-CM

## 2013-06-24 DIAGNOSIS — Z23 Encounter for immunization: Secondary | ICD-10-CM

## 2013-06-24 DIAGNOSIS — Z0000 Encounter for general adult medical examination without abnormal findings: Secondary | ICD-10-CM

## 2013-06-24 DIAGNOSIS — R32 Unspecified urinary incontinence: Secondary | ICD-10-CM

## 2013-06-24 DIAGNOSIS — Z5181 Encounter for therapeutic drug level monitoring: Secondary | ICD-10-CM

## 2013-06-24 DIAGNOSIS — G589 Mononeuropathy, unspecified: Secondary | ICD-10-CM

## 2013-06-24 LAB — COMPREHENSIVE METABOLIC PANEL
ALK PHOS: 114 U/L (ref 39–117)
ALT: 22 U/L (ref 0–53)
AST: 15 U/L (ref 0–37)
Albumin: 4.3 g/dL (ref 3.5–5.2)
BILIRUBIN TOTAL: 0.3 mg/dL (ref 0.2–1.2)
BUN: 12 mg/dL (ref 6–23)
CO2: 28 meq/L (ref 19–32)
Calcium: 9.7 mg/dL (ref 8.4–10.5)
Chloride: 103 mEq/L (ref 96–112)
Creat: 0.45 mg/dL — ABNORMAL LOW (ref 0.50–1.35)
Glucose, Bld: 82 mg/dL (ref 70–99)
Potassium: 4.3 mEq/L (ref 3.5–5.3)
SODIUM: 139 meq/L (ref 135–145)
TOTAL PROTEIN: 7.2 g/dL (ref 6.0–8.3)

## 2013-06-24 LAB — CBC
HCT: 40 % (ref 39.0–52.0)
Hemoglobin: 13.5 g/dL (ref 13.0–17.0)
MCH: 27.1 pg (ref 26.0–34.0)
MCHC: 33.8 g/dL (ref 30.0–36.0)
MCV: 80.3 fL (ref 78.0–100.0)
PLATELETS: 253 10*3/uL (ref 150–400)
RBC: 4.98 MIL/uL (ref 4.22–5.81)
RDW: 14.2 % (ref 11.5–15.5)
WBC: 5.5 10*3/uL (ref 4.0–10.5)

## 2013-06-24 NOTE — Assessment & Plan Note (Signed)
At baseline, intermittent. Continue tolterodine.

## 2013-06-24 NOTE — Patient Instructions (Signed)
Thank you for coming in, today!  [name] looks well, today. I completed his forms as needed. We'll check some labwork today. If there needs to be any changes, I'll call or send a letter.  He should get a colonoscopy. Follow the instructions on the form I gave you.  Come back to see me in 1 year, or sooner as needed. Please feel free to call with any questions or concerns at any time, at [phone]. --Dr. Casper HarrisonStreet

## 2013-06-24 NOTE — Assessment & Plan Note (Signed)
Check CBC and CMP given chronic Depakote use. Also check Depakote level.

## 2013-06-24 NOTE — Assessment & Plan Note (Signed)
Related to cerebral palsy / primary need for wheelchair dependency; pt does not have upper body strength to propel himself in a manual wheelchair and requires assistance for all transfers. Appears at baseline.

## 2013-06-24 NOTE — Assessment & Plan Note (Signed)
No hematemesis, GERD symptoms controlled with PPI. Continue current medications.

## 2013-06-24 NOTE — Progress Notes (Signed)
Subjective:    Patient ID: [name], male    DOB: 1961-01-07, 53 y.o.   MRN: [id number]  HPI: Pt presents to clinic for yearly follow-up without acute complaints. Caregiver brings in numerous forms including [place]-[facility] and other care home paperwork. He has no acute complaints. Caregiver reports no new problems with medications but does request clarification of orders and elimination of redundant / obsolete medication orders. He is generally well. He has not had a colonoscopy and has not had a flu shot this year.  Current Outpatient Prescriptions on File Prior to Visit  Medication Sig Dispense Refill  . baclofen (LIORESAL) 20 MG tablet Take 1 tablet (20 mg total) by mouth 4 (four) times daily.  120 each  5  . cephALEXin (KEFLEX) 500 MG capsule Take 1 capsule (500 mg total) by mouth 3 (three) times daily.  30 capsule  0  . collagenase (SANTYL) ointment Apply 1 application topically daily.      . divalproex (DEPAKOTE SPRINKLE) 125 MG capsule Take 500 mg by mouth 2 (two) times daily.       Marland Kitchen doxycycline (VIBRA-TABS) 100 MG tablet Take 1 tablet (100 mg total) by mouth 2 (two) times daily. Do not start taking until finished with the first prescription.  20 tablet  0  . Elastic Bandages & Supports (JOBST ANTI-EM KNEE HIGH MED) MISC by Does not apply route.      . Hydrocortisone Acetate 1 % CREA Apply 1 application topically daily. To face, every PM.  30 g  2  . Incontinence Supplies (MALE URINAL) MISC 1 Bottle by Does not apply route.      Marland Kitchen ketoconazole (NIZORAL) 2 % cream Apply 1 application topically daily.      . lansoprazole (PREVACID SOLUTAB) 30 MG disintegrating tablet Take 1 tablet (30 mg total) by mouth daily.  90 tablet  3  . LORazepam (ATIVAN) 1 MG tablet Take 1 mg by mouth every 4 (four) hours as needed for anxiety (anxiety).       . Multiple Vitamin (MULTIVITAMIN) capsule Take 1 capsule by mouth daily.  30 capsule  11  . risperiDONE (RISPERDAL) 1 MG tablet Take 1 mg by mouth at bedtime.        . silver sulfADIAZINE (SILVADENE) 1 % cream Apply topically daily. Apply to laceration area daily with dressing changes  50 g  0  . tolterodine (DETROL LA) 2 MG 24 hr capsule Take 1 capsule (2 mg total) by mouth daily.  90 capsule  3  . tretinoin (RETIN-A) 0.025 % gel Apply 1 applicator topically at bedtime.  45 g  2  . triamcinolone cream (KENALOG) 0.1 % Apply 1 application topically 2 (two) times daily.  454 g  1  . zinc oxide 20 % ointment Apply 1 application topically 2 (two) times a week.      . [DISCONTINUED] tolterodine (DETROL LA) 2 MG 24 hr capsule Take 1 capsule (2 mg total) by mouth daily.  90 capsule  3   No current facility-administered medications on file prior to visit.   Pt is a never smoker. In addition to the above documentation, pt's PMH, surgical history, FH, and SH all reviewed and updated where appropriate in the EMR. I have also reviewed and updated the pt's allergies and current medications as appropriate.  Review of Systems: As above. Otherwise, full 12-system ROS was reviewed and all negative.     Objective:   Physical Exam BP 137/95  Pulse 77  Temp(Src) 97.8 F (36.6 C) (Oral) Gen: adult male, wheel-chair bound, in no acute distress; answers questions appropriately, though does have some mental delay  HEENT: PERRLA, EOMI  TM's clear bilaterally, MMM, no cervical lymphadenopathy  small pieces of hard cerumen present in bilateral external ear canals, easily removed with flushing and ear curette Pulm: CTAB, no increased WOB, appears comfortable on room air  Cardio: RRR, no murmur appreciated  Ext/Neuro: non-focal exam, global weakness with some contractures, does move all extremities spontaneously Skin: no frank rashes noted     Assessment & Plan:  See individual problem list notes Discontinued ketoconazle to face (redundant order); continue other ketoconaole order (topical daily to face, skin folds, underarms) Discontinue zinc oxide, discontinue  Santyl, discontinue triamcinolone (no longer needed) Check CBC, CMP, Valproic acid level today.  Advised colonoscopy and gave paperwork to contact GI office to caregiver. Ears flushed bilaterally at pt's request with good removal of cerumen, bilaterally. F/u in 1 year or sooner as needed.

## 2013-06-24 NOTE — Assessment & Plan Note (Signed)
Advised caregiver to set pt up for outpt screening colonoscopy.  Flu shot given today. F/u in 1 year or sooner if needed.

## 2013-06-24 NOTE — Assessment & Plan Note (Signed)
Congenital in nature, primary need for wheelchair dependency; pt does not have upper body strength to propel himself in a manual wheelchair and requires assistance for all transfers. Appears at baseline.

## 2013-06-25 LAB — VALPROIC ACID LEVEL: VALPROIC ACID LVL: 77.2 ug/mL (ref 50.0–100.0)

## 2013-06-29 ENCOUNTER — Ambulatory Visit (HOSPITAL_BASED_OUTPATIENT_CLINIC_OR_DEPARTMENT_OTHER): Payer: Medicare Other | Admitting: Physical Medicine & Rehabilitation

## 2013-06-29 ENCOUNTER — Encounter: Payer: Medicare Other | Attending: Physical Medicine & Rehabilitation

## 2013-06-29 ENCOUNTER — Encounter: Payer: Self-pay | Admitting: Physical Medicine & Rehabilitation

## 2013-06-29 VITALS — BP 127/74 | HR 81 | Resp 14

## 2013-06-29 DIAGNOSIS — M7500 Adhesive capsulitis of unspecified shoulder: Secondary | ICD-10-CM

## 2013-06-29 DIAGNOSIS — M25519 Pain in unspecified shoulder: Secondary | ICD-10-CM | POA: Insufficient documentation

## 2013-06-29 MED ORDER — TIZANIDINE HCL 4 MG PO TABS: 4.0000 mg | ORAL_TABLET | Freq: Every day | ORAL | Status: DC

## 2013-06-29 MED ORDER — BACLOFEN 20 MG PO TABS: 20.0000 mg | ORAL_TABLET | Freq: Four times a day (QID) | ORAL | Status: DC

## 2013-06-29 NOTE — Progress Notes (Signed)
Shoulder injection Right With ultrasound guidance  Indication:Right Shoulder pain not relieved by medication management and other conservative care.  Informed consent was obtained after describing risks and benefits of the procedure with the patient, this includes bleeding, bruising, infection and medication side effects. The patient wishes to proceed and has given written consent. Patient was placed in a seated position. Theright shoulder was marked and prepped with betadine in the subacromial area. A 22 gauge ECHO block needle was inserted into the glenohumeral joint under direct long axis ultrasound visualization. Image saved. After negative draw back for blood, a solution containing 1 mL of 6 mg per ML betamethasone and 4 mL of 1% lidocaine was injected. A band aid was applied. The patient tolerated the procedure well. Post procedure instructions were given.

## 2013-06-29 NOTE — Patient Instructions (Signed)
Joint Injection Care After Refer to this sheet in the next few days. These instructions provide you with information on caring for yourself after you have had a joint injection. Your caregiver also may give you more specific instructions. Your treatment has been planned according to current medical practices, but problems sometimes occur. Call your caregiver if you have any problems or questions after your procedure. After any type of joint injection, it is not uncommon to experience:  Soreness, swelling, or bruising around the injection site.  Mild numbness, tingling, or weakness around the injection site caused by the numbing medicine used before or with the injection. It also is possible to experience the following effects associated with the specific agent after injection:  Iodine-based contrast agents:  Allergic reaction (itching, hives, widespread redness, and swelling beyond the injection site).  Corticosteroids (These effects are rare.):  Allergic reaction.  Increased blood sugar levels (If you have diabetes and you notice that your blood sugar levels have increased, notify your caregiver).  Increased blood pressure levels.  Mood swings.  Hyaluronic acid in the use of viscosupplementation.  Temporary heat or redness.  Temporary rash and itching.  Increased fluid accumulation in the injected joint. These effects all should resolve within a day after your procedure.  HOME CARE INSTRUCTIONS  Limit yourself to light activity the day of your procedure. Avoid lifting heavy objects, bending, stooping, or twisting.  Take prescription or over-the-counter pain medication as directed by your caregiver.  You may apply ice to your injection site to reduce pain and swelling the day of your procedure. Ice may be applied 03-04 times:  Put ice in a plastic bag.  Place a towel between your skin and the bag.  Leave the ice on for no longer than 15-20 minutes each time. SEEK  IMMEDIATE MEDICAL CARE IF:   Pain and swelling get worse rather than better or extend beyond the injection site.  Numbness does not go away.  Blood or fluid continues to leak from the injection site.  You have chest pain.  You have swelling of your face or tongue.  You have trouble breathing or you become dizzy.  You develop a fever, chills, or severe tenderness at the injection site that last longer than 1 day. MAKE SURE YOU:  Understand these instructions.  Watch your condition.  Get help right away if you are not doing well or if you get worse. Document Released: 01/24/2011 Document Revised: 08/05/2011 Document Reviewed: 01/24/2011 [employer] Patient Information 2014 [employer].  

## 2013-08-10 ENCOUNTER — Encounter: Payer: Self-pay | Admitting: Physical Medicine & Rehabilitation

## 2013-08-10 ENCOUNTER — Encounter: Payer: Medicare Other | Attending: Physical Medicine & Rehabilitation

## 2013-08-10 ENCOUNTER — Ambulatory Visit (HOSPITAL_BASED_OUTPATIENT_CLINIC_OR_DEPARTMENT_OTHER): Payer: Medicare Other | Admitting: Physical Medicine & Rehabilitation

## 2013-08-10 VITALS — BP 152/71 | HR 93 | Resp 14 | Ht 70.0 in

## 2013-08-10 DIAGNOSIS — M25561 Pain in right knee: Secondary | ICD-10-CM

## 2013-08-10 DIAGNOSIS — M25519 Pain in unspecified shoulder: Secondary | ICD-10-CM | POA: Insufficient documentation

## 2013-08-10 DIAGNOSIS — M7500 Adhesive capsulitis of unspecified shoulder: Secondary | ICD-10-CM | POA: Insufficient documentation

## 2013-08-10 DIAGNOSIS — M25569 Pain in unspecified knee: Secondary | ICD-10-CM

## 2013-08-10 DIAGNOSIS — G808 Other cerebral palsy: Secondary | ICD-10-CM

## 2013-08-10 DIAGNOSIS — G809 Cerebral palsy, unspecified: Secondary | ICD-10-CM

## 2013-08-10 MED ORDER — DICLOFENAC SODIUM 1 % TD GEL: 2.0000 g | Freq: Four times a day (QID) | TRANSDERMAL | Status: DC

## 2013-08-10 NOTE — Progress Notes (Signed)
   Subjective:    Patient ID: [name], male    DOB: 26-Dec-1960, 53 y.o.   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 Has been treated for spasticity as well as for right shoulder pain. His shoulder pain his overall improved after shoulder injection approximately one month ago. He still has some pain when pulling up during transfers. Main complaint today is right knee pain. This is worse when trying to stand for transfers. He had a fall couple days ago but he did not hurt his knee at all.   Review of Systems No swelling in the knee noted by patient or staff    Objective:   Physical Exam  General no acute distress sitting in a motorized wheelchair. Mood and affect are flat Speech is dysarthric Right upper extremity has decreased abduction external and internal rotation as well as flexion. Also has decreased elbow flexion Motor strength is 2 minus at the deltoid 3 minus at the bicep and tricep 3 minus at the finger flexors and extensors Ashworth grade 3 at the elbow flexors wrist flexors finger flexors as well as quadriceps. Tenderness to palpation at the right quadriceps tendon insertion upon the superior aspect of the patella. No evidence of knee effusion.      Assessment & Plan:  1. 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 No need for reinjection at the current time 2. Right knee quadriceps tendinopathy. Start rectal for neck gel. Will be difficult to treat secondary to weakness as well as spasticity  Return to clinic in 3 months

## 2013-08-10 NOTE — Patient Instructions (Signed)
Started a new medication today. Anti-inflammatory cream to put on right knee 4 times per day. I believe the issue is tendinitis related to spasticity. No shoulder injection done today. Return to clinic in 3 months

## 2013-08-13 NOTE — Telephone Encounter (Signed)
Addendum 08/13/13: Received "detailed written order form" from [employer] [employer] in [place], for replacement batteries for power wheelchair. Relevant Dx: cerebral palsy, congential quadriplegia, mental retardation, neuropathy (ICD-9 codes 343.9, 343.2, 319, 355.9). Oversigned by Dr. Lum BabeEniola (NPI [id number]) as this is for DME. Faxed back to [phone].

## 2013-09-29 ENCOUNTER — Telehealth: Payer: Self-pay | Admitting: Family Medicine

## 2013-09-29 NOTE — Telephone Encounter (Signed)
Received request for maintenance on pt's powered wheelchair; faxed specific order and last office visit from January to [facility] in [place](Attn Lori Fields, [phone]). Relevant Dx: cerebral palsy, congential quadriplegia, mental retardation, neuropathy (ICD-9 codes 343.9, 343.2, 319, 355.9). Oversigned by Dr. Lum BabeEniola (NPI [id number]) as this is for DME. --CMS

## 2013-09-29 NOTE — Telephone Encounter (Signed)
.  temp

## 2013-10-27 ENCOUNTER — Ambulatory Visit (INDEPENDENT_AMBULATORY_CARE_PROVIDER_SITE_OTHER): Payer: Medicare Other | Admitting: Emergency Medicine

## 2013-10-27 ENCOUNTER — Encounter: Payer: Self-pay | Admitting: Emergency Medicine

## 2013-10-27 VITALS — BP 133/65 | HR 76 | Temp 98.4°F

## 2013-10-27 DIAGNOSIS — L02619 Cutaneous abscess of unspecified foot: Secondary | ICD-10-CM

## 2013-10-27 DIAGNOSIS — L03115 Cellulitis of right lower limb: Secondary | ICD-10-CM | POA: Insufficient documentation

## 2013-10-27 DIAGNOSIS — L8990 Pressure ulcer of unspecified site, unspecified stage: Secondary | ICD-10-CM

## 2013-10-27 DIAGNOSIS — L03119 Cellulitis of unspecified part of limb: Secondary | ICD-10-CM

## 2013-10-27 MED ORDER — SULFAMETHOXAZOLE-TMP DS 800-160 MG PO TABS: 1.0000 | ORAL_TABLET | Freq: Two times a day (BID) | ORAL | Status: DC

## 2013-10-27 NOTE — Assessment & Plan Note (Signed)
These are most assistant with pressure ulcers. No history of diabetes and he has excellent pulses. Discussed avoiding shoes at this time. Recommended antibiotic ointment with gauze for dressing right now. Will address further after acute cellulitis has been treated.

## 2013-10-27 NOTE — Progress Notes (Signed)
Subjective:    Patient ID: [name], male    DOB: 05-23-1961, 53 y.o.   MRN: [id number]  HPI [name] is here for a same-day appointment for right foot swelling and redness.  He is here today with one of his caregivers. She states that starting yesterday she noticed worsening swelling and redness of his right foot. He has not had any fevers, chills, nausea, vomiting, loss of appetite. The caregiver also reports he has some recurrent ulcers, one on the MTP joint of the left great toe and another on the proximal phalanx of the right great toe. These have both reopened in the last day or so. He does have a chronic problem with leg edema due to to his contractures and problems with his wheelchair. He typically will wear TED hose daily.  Current Outpatient Prescriptions on File Prior to Visit  Medication Sig Dispense Refill  . baclofen (LIORESAL) 20 MG tablet Take 1 tablet (20 mg total) by mouth 4 (four) times daily.  120 each  5  . diclofenac sodium (VOLTAREN) 1 % GEL Apply 2 g topically 4 (four) times daily.  3 Tube  2  . divalproex (DEPAKOTE SPRINKLE) 125 MG capsule Take 500 mg by mouth 2 (two) times daily.       . Elastic Bandages & Supports (JOBST ANTI-EM KNEE HIGH MED) MISC by Does not apply route.      . Hydrocortisone Acetate 1 % CREA Apply 1 application topically daily. To face, every PM.  30 g  2  . Incontinence Supplies (MALE URINAL) MISC 1 Bottle by Does not apply route.      Marland Kitchen ketoconazole (NIZORAL) 2 % cream Apply 1 application topically daily.      . lansoprazole (PREVACID SOLUTAB) 30 MG disintegrating tablet Take 1 tablet (30 mg total) by mouth daily.  90 tablet  3  . LORazepam (ATIVAN) 1 MG tablet Take 1 mg by mouth every 4 (four) hours as needed for anxiety (anxiety).       . Multiple Vitamin (MULTIVITAMIN) capsule Take 1 capsule by mouth daily.  30 capsule  11  . risperiDONE (RISPERDAL) 1 MG tablet Take 1 mg by mouth at bedtime.       Marland Kitchen tiZANidine (ZANAFLEX) 4 MG  tablet Take 1 tablet (4 mg total) by mouth at bedtime.  30 tablet  5  . tolterodine (DETROL LA) 2 MG 24 hr capsule Take 1 capsule (2 mg total) by mouth daily.  90 capsule  3  . tretinoin (RETIN-A) 0.025 % gel Apply 1 applicator topically at bedtime.  45 g  2  . [DISCONTINUED] tolterodine (DETROL LA) 2 MG 24 hr capsule Take 1 capsule (2 mg total) by mouth daily.  90 capsule  3   No current facility-administered medications on file prior to visit.    I have reviewed and updated the following as appropriate: allergies and current medications SHx: never smoker   Review of Systems See HPI    Objective:   Physical Exam  Musculoskeletal:       Feet:   BP 133/65  Pulse 76  Temp(Src) 98.4 F (36.9 C) (Oral) Gen: alert, cooperative, NAD Left foot: superficial ulceration over dorsal MTP joint of great toe; some erythema at the medial malleolus; 2+ DP pulse Right foot: ulcer over proximal phalanx of the great toe; erythema, warmth, and edema of the foot to just above the ankle; 2+ DP pulse      Assessment & Plan:

## 2013-10-27 NOTE — Patient Instructions (Signed)
It was nice to meet you!  Take Bactrim 1 pill twice a day for 10 days for the infection in the right foot. It is VERY IMPORTANT to keep all pressure off the foot right now. Keep the foot elevated. Do not use the TED hose until the infection is gone.  For the ulcers, apply neosporin or vaseline and cover with gauze.  Follow up on Tuesday.  If there is any worsening, fevers, loss of appetite, come back right away.

## 2013-10-27 NOTE — Assessment & Plan Note (Signed)
Will start Bactrim twice a day for 10 days. This is to cover MRSA given he lives in a group home. Do not use TED hose while actively infected. Reviewed reasons to return or be evaluated in the ER, including fevers, chills, nausea, loss of appetite. Followup on Tuesday for recheck.

## 2013-11-02 ENCOUNTER — Encounter: Payer: Self-pay | Admitting: Emergency Medicine

## 2013-11-02 ENCOUNTER — Ambulatory Visit
Admission: RE | Admit: 2013-11-02 | Discharge: 2013-11-02 | Disposition: A | Discharge status: Home / Self Care | Payer: Medicare Other | Source: Ambulatory Visit | Attending: Emergency Medicine | Admitting: Emergency Medicine

## 2013-11-02 ENCOUNTER — Ambulatory Visit (INDEPENDENT_AMBULATORY_CARE_PROVIDER_SITE_OTHER): Payer: Medicare Other | Admitting: Emergency Medicine

## 2013-11-02 VITALS — BP 116/70 | HR 82 | Temp 98.5°F | Ht 70.0 in

## 2013-11-02 DIAGNOSIS — L03115 Cellulitis of right lower limb: Secondary | ICD-10-CM

## 2013-11-02 DIAGNOSIS — L03119 Cellulitis of unspecified part of limb: Secondary | ICD-10-CM

## 2013-11-02 DIAGNOSIS — L8990 Pressure ulcer of unspecified site, unspecified stage: Secondary | ICD-10-CM

## 2013-11-02 DIAGNOSIS — Z23 Encounter for immunization: Secondary | ICD-10-CM

## 2013-11-02 DIAGNOSIS — L02619 Cutaneous abscess of unspecified foot: Secondary | ICD-10-CM

## 2013-11-02 MED ORDER — SULFAMETHOXAZOLE-TMP DS 800-160 MG PO TABS: 1.0000 | ORAL_TABLET | Freq: Two times a day (BID) | ORAL | Status: AC

## 2013-11-02 NOTE — Progress Notes (Addendum)
Subjective:    Patient ID: [name], male    DOB: 1960-07-06, 53 y.o.   MRN: [id number]  HPI [name] Rhineerry L Delangel is here for followup right foot cellulitis.  Aurther Lofterry and his caregiver are here today. They both report that his cellulitis is much better. He has not had any fevers, chills, nausea, vomiting. His appetite remains excellent. He has had minimal swelling with elevation alone. As instructed, they have not been using the TED hose for the last week. His caregiver reports a bruising over his left medial malleolus looks darker. The ulcer on the left first MTP joint is improved. The ulcer on the right proximal phalanx of the great toe does not look any better to her.  Current Outpatient Prescriptions on File Prior to Visit  Medication Sig Dispense Refill  . baclofen (LIORESAL) 20 MG tablet Take 1 tablet (20 mg total) by mouth 4 (four) times daily.  120 each  5  . diclofenac sodium (VOLTAREN) 1 % GEL Apply 2 g topically 4 (four) times daily.  3 Tube  2  . divalproex (DEPAKOTE SPRINKLE) 125 MG capsule Take 500 mg by mouth 2 (two) times daily.       . Elastic Bandages & Supports (JOBST ANTI-EM KNEE HIGH MED) MISC by Does not apply route.      . Hydrocortisone Acetate 1 % CREA Apply 1 application topically daily. To face, every PM.  30 g  2  . Incontinence Supplies (MALE URINAL) MISC 1 Bottle by Does not apply route.      Marland Kitchen. ketoconazole (NIZORAL) 2 % cream Apply 1 application topically daily.      . lansoprazole (PREVACID SOLUTAB) 30 MG disintegrating tablet Take 1 tablet (30 mg total) by mouth daily.  90 tablet  3  . LORazepam (ATIVAN) 1 MG tablet Take 1 mg by mouth every 4 (four) hours as needed for anxiety (anxiety).       . Multiple Vitamin (MULTIVITAMIN) capsule Take 1 capsule by mouth daily.  30 capsule  11  . risperiDONE (RISPERDAL) 1 MG tablet Take 1 mg by mouth at bedtime.       Marland Kitchen. tiZANidine (ZANAFLEX) 4 MG tablet Take 1 tablet (4 mg total) by mouth at bedtime.  30 tablet  5  .  tolterodine (DETROL LA) 2 MG 24 hr capsule Take 1 capsule (2 mg total) by mouth daily.  90 capsule  3  . tretinoin (RETIN-A) 0.025 % gel Apply 1 applicator topically at bedtime.  45 g  2  . [DISCONTINUED] tolterodine (DETROL LA) 2 MG 24 hr capsule Take 1 capsule (2 mg total) by mouth daily.  90 capsule  3   No current facility-administered medications on file prior to visit.    I have reviewed and updated the following as appropriate: allergies and current medications SHx: never smoker   Review of Systems See HPI    Objective:   Physical Exam BP 116/70  Pulse 82  Temp(Src) 98.5 F (36.9 C) (Oral)  Ht 5\' 10"  (1.778 m) Gen: alert, cooperative, NAD Left foot: Dark purple, nonblanching bruising covering the medial malleolus; ulcer over the first MTP joint is improved, with a scab in place, and no surrounding erythema. Right foot: Swelling and erythema limited to the forefoot, worse around the great toe. He has 2+ DP pulses. The ulcer over his great toe proximal phalanx has a lack eschar over it, with surrounding erythema. No appreciable drainage. He does have some maceration between the first 2  toes.       Assessment & Plan:  I reviewed and discussed with Dr Piedad Climes

## 2013-11-02 NOTE — Assessment & Plan Note (Signed)
The left first MTP ulcer looks much better. He does have some deep bruising of the left medial malleolus. We will continue to watch this for now. Again emphasized importance of alleviating pressure. I am concerned about underlying osteo on the right great toe ulcer. Will check a plain film of the right foot to evaluate for osteomyelitis. Continue antibiotics for an additional week. Followup next week.

## 2013-11-02 NOTE — Assessment & Plan Note (Signed)
This is much improved on the Bactrim. Given concern for underlying osteomyelitis we have extended the course by one week. Followup in one week.

## 2013-11-02 NOTE — Progress Notes (Signed)
Reviewed and discussed

## 2013-11-02 NOTE — Patient Instructions (Signed)
It was nice to see you!  Everything is looking much better except for that toe. - stay diligent about avoid pressure spots - we extended the antibiotics by another week - please get the x-ray of his foot in the next week  It is okay to use the TED hose on the left leg now.  Follow up in 1 week.

## 2013-11-09 ENCOUNTER — Encounter: Payer: Self-pay | Admitting: Family Medicine

## 2013-11-09 ENCOUNTER — Ambulatory Visit (INDEPENDENT_AMBULATORY_CARE_PROVIDER_SITE_OTHER): Payer: Medicare Other | Admitting: Family Medicine

## 2013-11-09 ENCOUNTER — Ambulatory Visit: Payer: Medicare Other | Admitting: Physical Medicine & Rehabilitation

## 2013-11-09 VITALS — BP 116/75 | HR 73 | Temp 97.3°F

## 2013-11-09 DIAGNOSIS — L02619 Cutaneous abscess of unspecified foot: Secondary | ICD-10-CM

## 2013-11-09 DIAGNOSIS — L8990 Pressure ulcer of unspecified site, unspecified stage: Secondary | ICD-10-CM

## 2013-11-09 DIAGNOSIS — L03119 Cellulitis of unspecified part of limb: Secondary | ICD-10-CM

## 2013-11-09 DIAGNOSIS — L03115 Cellulitis of right lower limb: Secondary | ICD-10-CM

## 2013-11-09 NOTE — Progress Notes (Signed)
   Subjective:    Patient ID: [name], male    DOB: [birth year], 53 y.o.   MRN: [id number]  HPI: Pt presents to clinic with caregiver for follow-up of right foot cellulitis and foot / ankle pressure ulcers. He was seen last week by Dr. Piedad ClimesHonig and has about 1 week left of Bactrim. His caregiver states that she thinks his foot ulcers and swelling is much better, but his right foot is healing slowly still with some redness and swelling. Otherwise, he is doing well; his appetite is good, he has had no fevers / chills, N/V, or diarrhea. He did go out in the sun over the weekend and got a mild sunburn to his scalp.  He has not been using TED hose for chronic limb swelling due to the infections. His caregiver states that pt has been seen at wound care in the past for similar issues and typically 'takes a long time to heal.'  Review of Systems: As above.      Objective:   Physical Exam BP 116/75  Pulse 73  Temp(Src) 97.3 F (36.3 C) (Oral) Gen: well-appearing adult male, quadriplegic, in motorized wheelchair; awake / alert in no distress Ext: bilateral 2+ DP pulses; limbs very thin, lower L ext thinner than R Right foot / ankle:  -forefoot with redness and swelling, worse around the great toe, but improved from prior exams  -ulceration over great toe proximal phalanx with eschar remains, with some surrounding erythema and maceration between toes 1 and 2  -no bleeding or active drainage, tender around ulcer but "better" per pt Left foot / ankle:  -bruising over medial malleolus improved  -ulcer over the first MTP joint much improved with small eschar in place and without erythema, bleeding, or drainage     Assessment & Plan:

## 2013-11-09 NOTE — Assessment & Plan Note (Signed)
Ulcers overall improved with alleviating pressure on toes, heels, and malleoli. May need to consider wound care or podiatry referral if ulcers do not heal well on their own. Continue abx and f/u in 2 weeks; see separate problem list note for cellulitis. Letter with care instructions provided to caregiver, today; specifically addressed proper use of TED hose, pillows to avoid pressure of medial malleoli lying together when pt sleeps on his side.

## 2013-11-09 NOTE — Assessment & Plan Note (Signed)
A: Cellulitis improving on Bactrim, with redness and swelling still present but slowly resolving. No evidence to suggest osteomyelitis on recent XR.   P: Complete Bactrim, then f/u in 1 week after abx are completed. Hold off on TED hose use until ulcers heal; if no improvement in ulcers by next visit, consider referral to podiatry or wound care. Letter with care instructions provided to caregiver, today. Specifically addressed sunscreen use while on Bactrim.

## 2013-11-09 NOTE — Patient Instructions (Signed)
Thank you for coming in, today!  [name]'s feet and ankles look better. He should continue his antibiotics until they're done. He should come back to be checked again in about 2 weeks.  Wait until his ulcers look like they're healed up before getting him new TED hose. If it looks like his ulcers are not healing well when he comes back, we might refer him to podiatry or the wound clinic. Please feel free to call with any questions or concerns at any time, at [phone]. --Dr. Casper HarrisonStreet

## 2013-11-12 ENCOUNTER — Ambulatory Visit: Payer: Medicare Other | Admitting: Physical Medicine & Rehabilitation

## 2013-11-23 ENCOUNTER — Ambulatory Visit (INDEPENDENT_AMBULATORY_CARE_PROVIDER_SITE_OTHER): Payer: Medicare Other | Admitting: Family Medicine

## 2013-11-23 ENCOUNTER — Encounter: Payer: Self-pay | Admitting: Family Medicine

## 2013-11-23 VITALS — BP 124/82 | HR 73 | Temp 98.0°F

## 2013-11-23 DIAGNOSIS — L02619 Cutaneous abscess of unspecified foot: Secondary | ICD-10-CM

## 2013-11-23 DIAGNOSIS — L8990 Pressure ulcer of unspecified site, unspecified stage: Secondary | ICD-10-CM

## 2013-11-23 DIAGNOSIS — M25569 Pain in unspecified knee: Secondary | ICD-10-CM | POA: Insufficient documentation

## 2013-11-23 DIAGNOSIS — L03115 Cellulitis of right lower limb: Secondary | ICD-10-CM

## 2013-11-23 DIAGNOSIS — M25561 Pain in right knee: Secondary | ICD-10-CM

## 2013-11-23 DIAGNOSIS — M25562 Pain in left knee: Secondary | ICD-10-CM

## 2013-11-23 DIAGNOSIS — L03119 Cellulitis of unspecified part of limb: Secondary | ICD-10-CM

## 2013-11-23 DIAGNOSIS — B351 Tinea unguium: Secondary | ICD-10-CM

## 2013-11-23 MED ORDER — TRAMADOL HCL 50 MG PO TABS: 50.0000 mg | ORAL_TABLET | Freq: Four times a day (QID) | ORAL | Status: DC | PRN

## 2013-11-23 NOTE — Progress Notes (Signed)
   Subjective:    Patient ID: [name] L Fackrell, male    DOB: 15-Apr-1961, 53 y.o.   MRN: [id number]  HPI: t presents to clinic with caregiver for follow-up of right foot cellulitis and foot / ankle pressure ulcers. He was seen two weeks ago by me and has completed his course of antibiotics. Per caregiver, his pain and his ulcers continue to improve. He does still have ulceration over his right great toe and some redness / swelling in his feet, but it appears more like his usual chronic venous stasis issues and he appears to have cleared the infection. Otherwise, he is doing well; his appetite continues to be good and he has no systemic symptoms such as fevers / chills, N/V, or diarrhea.  He still has not been using TED hose for chronic limb swelling due to healing ulcerations; his caregiver again states that pt has been seen at wound care in the past for similar issues and typically 'takes a long time to heal,' and questions whether podiatry could help with figuring out a way to protect pt's feet (he has long legs and often runs into things using his motorized wheelchair, as he has poor reaction time secondary to CP/quadriplegia). Of note, he does complaint of intermittent knee pain, right more than left, mostly at night, which is helped some by Tylenol and is worse with movement secondary to his contractures. His caregiver doesn't think he has ever used tramadol for it, or any similar medications.  Review of Systems: As above.     Objective:   Physical Exam BP 124/82  Pulse 73  Temp(Src) 98 F (36.7 C) (Oral) Gen: well-appearing adult male, quadriplegic, in motorized wheelchair; awake / alert in no distress  Ext: bilateral 2+ DP pulses; limbs very thin, lower L ext thinner than R   Right knee mildly painful with passive movement; ROM severely reduced with contracture secondary to CP / quadriplegia Right foot / ankle:   -continued improvement in forefoot with redness and swelling, worst around the  great toe  -ulceration over great toe proximal phalanx with eschar remains but improved  -no bleeding or active drainage, tenderness improved Left foot / ankle:   -bruising over medial malleolus continues to improve; some skin discoloration from apparent venous stasis  -ulcer over the first MTP joint healing well, with small eschar slowly resolving Feet (global): multiple toes with thickened, dysmorphic nails and scattered small scabs / callouses     Assessment & Plan:

## 2013-11-23 NOTE — Assessment & Plan Note (Signed)
Multiple toes of bilateral feet with thickening and discoloration, as well as scattered callous / scabs and healing pressure ulcers / cellulitis as per other problem list notes. Referred to podiatry today; pt has seen them in the past. F/u with me afterward as needed.

## 2013-11-23 NOTE — Patient Instructions (Signed)
Thank you for coming in, today!  [name]'s foot ulcers look better. They are probably going to take a long time to heal. He does not need any more antibiotics, for now.  He can continue to use Tylenol as needed for pain. He can use tramadol (new prescription) as needed for pain, as well. It may make him sleepy, especially the first few times he takes it. He can use both tramadol and Tylenol together.  I do want Aurther Lofterry to see podiatry to see if they can help with shoes, hose, or other treatments that'll help his ulcers and to protect his feet. I put in the referral today; you will be contacted for an appointment. He can follow up with me as needed. Please feel free to call with any questions or concerns at any time, at [phone]. --Dr. Casper HarrisonStreet

## 2013-11-23 NOTE — Assessment & Plan Note (Signed)
A: Resolved s/p course of Bactrim. Still with ulceration (see separate problem list notes), but healing, and no systemic symptoms.  P: Continue local wound care; referred to podiatry today (see separate problem list notes).

## 2013-11-23 NOTE — Assessment & Plan Note (Signed)
Ulcers overall improving with measures to alleviate pressure on heels, malleoli, and toes. Referred to podiatry today given extent of ulcers and chronic feet issues. Continue proper use of pillows to avoid pressure, heel protectors, etc; consider Rx for post-op shoe or something similar to protect feet, if podiatry does not have better / specific recommendations. Otherwise plan to resume TED hose use as ulcers heal. F/u as needed, otherwise.

## 2013-12-08 ENCOUNTER — Telehealth: Payer: Self-pay | Admitting: Family Medicine

## 2013-12-08 NOTE — Telephone Encounter (Signed)
Received request for maintenance on pt's powered wheelchair; specific order and last few office visits to be faxed to [facility] in [place] [phone]). Relevant Dx: cerebral palsy, congential quadriplegia, mental retardation, neuropathy (ICD-9 codes 343.9, 343.2, 319, 355.9). To be oversigned by Dr. Lum BabeEniola (NPI [id number]) as this is for DME. --CMS

## 2013-12-17 ENCOUNTER — Encounter: Payer: Medicare Other | Attending: Physical Medicine & Rehabilitation

## 2013-12-17 ENCOUNTER — Encounter: Payer: Self-pay | Admitting: Physical Medicine & Rehabilitation

## 2013-12-17 ENCOUNTER — Ambulatory Visit (HOSPITAL_BASED_OUTPATIENT_CLINIC_OR_DEPARTMENT_OTHER): Payer: Medicare Other | Admitting: Physical Medicine & Rehabilitation

## 2013-12-17 VITALS — BP 114/58 | HR 78 | Resp 14

## 2013-12-17 DIAGNOSIS — M25559 Pain in unspecified hip: Secondary | ICD-10-CM | POA: Diagnosis present

## 2013-12-17 DIAGNOSIS — E785 Hyperlipidemia, unspecified: Secondary | ICD-10-CM | POA: Diagnosis not present

## 2013-12-17 DIAGNOSIS — M25519 Pain in unspecified shoulder: Secondary | ICD-10-CM | POA: Insufficient documentation

## 2013-12-17 DIAGNOSIS — F79 Unspecified intellectual disabilities: Secondary | ICD-10-CM | POA: Diagnosis not present

## 2013-12-17 DIAGNOSIS — M25561 Pain in right knee: Secondary | ICD-10-CM

## 2013-12-17 DIAGNOSIS — M25569 Pain in unspecified knee: Secondary | ICD-10-CM

## 2013-12-17 DIAGNOSIS — G809 Cerebral palsy, unspecified: Secondary | ICD-10-CM | POA: Insufficient documentation

## 2013-12-17 DIAGNOSIS — Z79899 Other long term (current) drug therapy: Secondary | ICD-10-CM | POA: Insufficient documentation

## 2013-12-17 MED ORDER — BACLOFEN 20 MG PO TABS: 20.0000 mg | ORAL_TABLET | Freq: Four times a day (QID) | ORAL | Status: DC

## 2013-12-17 MED ORDER — TIZANIDINE HCL 4 MG PO TABS: 4.0000 mg | ORAL_TABLET | Freq: Every day | ORAL | Status: DC

## 2013-12-17 MED ORDER — DICLOFENAC SODIUM 1 % TD GEL: 2.0000 g | Freq: Four times a day (QID) | TRANSDERMAL | Status: DC

## 2013-12-17 NOTE — Patient Instructions (Signed)
Knee xray should be done in the next 1-2 wks

## 2013-12-17 NOTE — Progress Notes (Signed)
   Subjective:    Patient ID: [name], male    DOB: 1960-08-15, 53 y.o.   MRN: [id number]  HPI Put on prn tramadol for knee pain Shoulder pain comes and goes, R shoulder injection February 2015 Knee pain is overall improved  Sometimes legs shake, takes baclofen 20mg   four times a day, Zanaflex 4mg  qhs Pain Inventory Average Pain n/a Pain Right Now n/a My pain is aching  In the last 24 hours, has pain interfered with the following? General activity n/a Relation with others n/a Enjoyment of life n/a What TIME of day is your pain at its worst? n/a Sleep (in general) Fair  Pain is worse with: n/a Pain improves with: n/a Relief from Meds: 8  Mobility ability to climb steps?  no do you drive?  no use a wheelchair needs help with transfers  Function disabled: date disabled . I need assistance with the following:  dressing, bathing, toileting, meal prep, household duties and shopping  Neuro/Psych bladder control problems bowel control problems  Prior Studies Any changes since last visit?  no  Physicians involved in your care Any changes since last visit?  no   History reviewed. No pertinent family history. History   Social History  . Marital Status: Single    Spouse Name: N/A    Number of Children: N/A  . Years of Education: N/A   Social History Main Topics  . Smoking status: Never Smoker   . Smokeless tobacco: Never Used  . Alcohol Use: No  . Drug Use: No  . Sexual Activity: No   Other Topics Concern  . None   Social History Narrative  . None   Past Surgical History  Procedure Laterality Date  . Stomach surgery      as an infant "couldn't keep food down"   Past Medical History  Diagnosis Date  . 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 CEREBRAL PALSY 07/24/2006  . HYPERLIPIDEMIA 07/02/2010  . GASTRIC ULCER ACUTE WITH HEMORRHAGE 07/24/2006  . 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 URINARY INCONTINENCE 02/09/2010   BP 114/58  Pulse 78  Resp 14  SpO2  100%  Opioid Risk Score:   Fall Risk Score:     Review of Systems  All other systems reviewed and are negative.      Objective:   Physical Exam  Right shoulder abduction and flexion to 80 Left shoulder abduction flexion to 120 Decreased external rotation right shoulder increased arm flexor and finger and wrist flexor tone Ashworth 2 Hip abductors Ashworth 2 Pain with knee flexion on the right side above the patella     Assessment & Plan:  1. 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 Tone control is adequate for ADLs. Patient is dependent and also of care at wheelchair level. 2. Right shoulder pain adhesive capsulitis overall improved with injection as well as medications 3. Right knee pain appears to be in the quadriceps tendon region. Will check x-ray since he still has tenderness and pain with flexion in the suprapatellar region

## 2013-12-28 ENCOUNTER — Ambulatory Visit: Payer: Medicare Other

## 2013-12-28 ENCOUNTER — Ambulatory Visit (INDEPENDENT_AMBULATORY_CARE_PROVIDER_SITE_OTHER): Payer: Medicare Other

## 2013-12-28 VITALS — BP 136/90 | HR 74 | Resp 12

## 2013-12-28 DIAGNOSIS — I739 Peripheral vascular disease, unspecified: Secondary | ICD-10-CM

## 2013-12-28 DIAGNOSIS — IMO0002 Reserved for concepts with insufficient information to code with codable children: Secondary | ICD-10-CM

## 2013-12-28 DIAGNOSIS — L97509 Non-pressure chronic ulcer of other part of unspecified foot with unspecified severity: Secondary | ICD-10-CM

## 2013-12-28 DIAGNOSIS — S90121S Contusion of right lesser toe(s) without damage to nail, sequela: Secondary | ICD-10-CM

## 2013-12-28 MED ORDER — SILVER SULFADIAZINE 1 % EX CREA: 1.0000 "application " | TOPICAL_CREAM | Freq: Every day | CUTANEOUS | Status: DC

## 2013-12-28 NOTE — Patient Instructions (Signed)
Orders for ulcer care right foot  Per Dr. Ralene CorkSikora at [facility]   ANTIBACTERIAL SOAP INSTRUCTIONS  THE DAY AFTER PROCEDURE  Please follow the instructions your doctor has marked.   Shower as usual. Before getting out, place a drop of antibacterial liquid soap (Dial) on a wet, clean washcloth.  Gently wipe washcloth over affected area.  Afterward, rinse the area with warm water.  Blot the area dry with a soft cloth and cover with antibiotic ointment (neosporin, polysporin, bacitracin) and band aid or gauze and tape  Place 3-4 drops of antibacterial liquid soap in a quart of warm tap water.  Submerge foot into water for 20 minutes.  If bandage was applied after your procedure, leave on to allow for easy lift off, then remove and continue with soak for the remaining time.  Next, blot area dry with a soft cloth and cover with a bandage.  Apply other medications as directed by your doctor, such as cortisporin otic solution (eardrops) or neosporin antibiotic ointment  After washing and drying the foot thoroughly once daily apply Silvadene and gauze dressing to the right great toe. Continue daily dressing changes with Silvadene until wound has resolved  [name] DPM

## 2013-12-28 NOTE — Progress Notes (Signed)
Subjective:    Patient ID: [name], male    DOB: Nov 05, 1960, 53 y.o.   MRN: [id number]  HPI PT CAREGIVER STATED RT FOOT WOUND START BLEEDING YESTERDAY AND CHECK LT FOOT.    Review of Systems  Constitutional: Positive for fatigue.  HENT: Negative.   Eyes: Negative.   Respiratory: Negative.   Cardiovascular: Negative.   Gastrointestinal: Negative.   Genitourinary: Negative.   Musculoskeletal: Positive for gait problem.  Skin: Positive for color change and wound.  Allergic/Immunologic: Negative.   Neurological: Positive for weakness and numbness.  Hematological: Bruises/bleeds easily.  Psychiatric/Behavioral: Negative.        Objective:   Physical Exam 53 year old male presents at this time to the caregiver patient is nonambulatory in a wheelchair with a complaint of a lesion over the hallux IP joint right great toe. Patient was last seen for the same problem in October of 2014 there is apparently trauma to the nail and trauma to the hallux caregiver indicates that the heels however re\re exacerbating the last couple of months again. Patient has some areas of eschar tissue malleoli are area distal tuft of both hallux with dry eschar being noted in the dry eschar with some bloody discharge drainage of the hallux IP joint right great toe dorsally. Lower extremity objective findings as follows vascular status is grossly diminished with pedal pulses DP and PT thready at plus one over 4 bilateral warm temperature warm to cool turgor diminished there is no edema noted neurologically epicritic and proprioceptive sensations intact although diminished on Semmes Weinstein testing to the forefoot and digits motor strength is nearly completely absent as very little motor function or strength in the feet and legs. Patient is nonambulatory consider muscle atrophy is identified. Orthopedic exam otherwise unremarkable rectus foot no new x-rays taken at this time. Semirigid digital contractures are  noted. Dermatologically there is ulcer approximately 2 cm in diameter with dorsal IP joint of the right great toe with overlying eschar tissue and dried blood. There is also several other areas of contusion of the malleoli are area and dorsum of the foot with dry eschar being noted and some generalized bruising the lower extremities noted. Patient recently treated with 2 rows of antibiotic for cellulitis of the right leg. The cellulitis appears to resolve there is some residual slight redness of the hallux noted       Assessment & Plan:   assessment this time ulceration secondary history of contusion right great toe. The ulcer site is debrided at this time and cleansed with all cleansed Silvadene gauze dressing is applied and will initiate daily Silvadene gauze dressing changes as instructed cleansing with soap and water as instructed he. Dressed and cushioned maintain sandals which is currently wearing a protective shoe gear. Recheck in 2-3 weeks for followup contact me change or exacerbations were to occur prescription for Silvadene is also furnished as well as orders for daily cleansing and dressing changes. Recheck in 2-3 weeks  Alvan Dame DPM

## 2014-01-06 ENCOUNTER — Telehealth: Payer: Self-pay | Admitting: Family Medicine

## 2014-01-06 NOTE — Telephone Encounter (Signed)
Received request for maintenance on pt's powered wheelchair; specific order and office visits from January 2015 to be faxed to [facility] in [place] [phone]). Relevant Dx: cerebral palsy, congential quadriplegia, mental retardation, neuropathy (ICD-9 codes 343.9, 343.2, 319, 355.9). To be oversigned by Dr. Lum BabeEniola (NPI [id number]) as this is for DME. --CMS

## 2014-01-12 ENCOUNTER — Ambulatory Visit (INDEPENDENT_AMBULATORY_CARE_PROVIDER_SITE_OTHER): Payer: Medicare Other

## 2014-01-12 VITALS — BP 126/79 | HR 90 | Resp 16

## 2014-01-12 DIAGNOSIS — L97509 Non-pressure chronic ulcer of other part of unspecified foot with unspecified severity: Secondary | ICD-10-CM

## 2014-01-12 DIAGNOSIS — S90121S Contusion of right lesser toe(s) without damage to nail, sequela: Secondary | ICD-10-CM

## 2014-01-12 DIAGNOSIS — I739 Peripheral vascular disease, unspecified: Secondary | ICD-10-CM

## 2014-01-12 DIAGNOSIS — G589 Mononeuropathy, unspecified: Secondary | ICD-10-CM

## 2014-01-12 DIAGNOSIS — G629 Polyneuropathy, unspecified: Secondary | ICD-10-CM

## 2014-01-12 DIAGNOSIS — S91119A Laceration without foreign body of unspecified toe without damage to nail, initial encounter: Secondary | ICD-10-CM

## 2014-01-12 NOTE — Progress Notes (Signed)
Subjective:    Patient ID: [name], male    DOB: [birth year], 53 y.o.   MRN: [id number]  HPI patient indicates everything looks pretty good    Review of Systems no new findings or systemic changes noted     Objective:   Physical Exam Should note neurovascular status is unchanged patient is a wheelchair nonambulatory there is still ulceration over the dorsum hallux IP joint right great toe the good pink granular base or lucency surrounding necrotic and eschar tissue around the periphery the ulcer which is debrided there has been some slight drainage noted there is no ascending psoas lymphangitis no malodor no purulence appears to be clean and stable at this time still approximately 1.5 x 1.2 cm overall diameter it doesn't seem to gotten larger       Assessment & Plan:  Assessment this time is ulceration secondary contusion or laceration of right great toe ulcer is debrided down to subcutaneous tissue level Silvadene and gauze dressing applied continue with daily cleansing Silvadene gauze dressing application recommended accommodative shoe such as Uggs to cushion and protect the feet in the future. Followup in 3 weeks for reevaluation ulcer check  Alvan Dame DPM

## 2014-01-12 NOTE — Progress Notes (Deleted)
its pretty good-rt

## 2014-01-12 NOTE — Patient Instructions (Signed)
ANTIBACTERIAL SOAP INSTRUCTIONS  THE DAY AFTER PROCEDURE  Please follow the instructions your doctor has marked.   Shower as usual. Before getting out, place a drop of antibacterial liquid soap (Dial) on a wet, clean washcloth.  Gently wipe washcloth over affected area.  Afterward, rinse the area with warm water.  Blot the area dry with a soft cloth and cover with antibiotic ointment (neosporin, polysporin, bacitracin) and band aid or gauze and tape  Place 3-4 drops of antibacterial liquid soap in a quart of warm tap water.  Submerge foot into water for 20 minutes.  If bandage was applied after your procedure, leave on to allow for easy lift off, then remove and continue with soak for the remaining time.  Next, blot area dry with a soft cloth and cover with a bandage.  Apply other medications as directed by your doctor, such as cortisporin otic solution (eardrops) or neosporin antibiotic ointment  Wash daily with soap and water and dried thoroughly and apply Silvadene and gauze wrap of right great   Consider using shoes likeUggs to help keep the foot protected in warm. These boots have any fleece or lambswool lining which will cushion the foot and prevent any future abrasions or contusions to

## 2014-01-25 ENCOUNTER — Ambulatory Visit (INDEPENDENT_AMBULATORY_CARE_PROVIDER_SITE_OTHER): Payer: Medicare Other

## 2014-01-25 VITALS — BP 174/84 | HR 78 | Temp 97.4°F | Resp 16

## 2014-01-25 DIAGNOSIS — I739 Peripheral vascular disease, unspecified: Secondary | ICD-10-CM

## 2014-01-25 DIAGNOSIS — L97509 Non-pressure chronic ulcer of other part of unspecified foot with unspecified severity: Secondary | ICD-10-CM

## 2014-01-25 DIAGNOSIS — G589 Mononeuropathy, unspecified: Secondary | ICD-10-CM

## 2014-01-25 DIAGNOSIS — S90121S Contusion of right lesser toe(s) without damage to nail, sequela: Secondary | ICD-10-CM

## 2014-01-25 DIAGNOSIS — G629 Polyneuropathy, unspecified: Secondary | ICD-10-CM

## 2014-01-25 NOTE — Patient Instructions (Signed)
Orders for wound care: Dr. Alvan Dame DPM, [facility]   ANTIBACTERIAL SOAP INSTRUCTIONS  Ulcer care instructions/order #1;  Please follow the instructions your doctor has marked.   Shower as usual. Before getting out, place a drop of antibacterial liquid soap (Dial) on a wet, clean washcloth.  Gently wipe washcloth over affected area.  Afterward, rinse the area with warm water.  Blot the area dry with a soft cloth and cover with antibiotic ointment (neosporin, polysporin, bacitracin) and band aid or gauze and tape  Place 3-4 drops of antibacterial liquid soap in a quart of warm tap water.  Submerge foot into water for 20 minutes.  If bandage was applied after your procedure, leave on to allow for easy lift off, then remove and continue with soak for the remaining time.  Next, blot area dry with a soft cloth and cover with a bandage.  Apply other medications as directed by your doctor, such as cortisporin otic solution (eardrops) or neosporin antibiotic ointment  Daily wash of the foot with antibacterial soap and warm water dried thoroughly apply Silvadene cream that was prescribed and gauze dressing to the great toe. Maintain dressing changes daily as instructed until wound has healed or resolved.    Order and recommendation #2: Obtain a foam positioning device or leg cradle to help maintain a position and offload his feet and ankles, while patient is in bed or in a wheelchair. He foam padded leg cradle or heel lift type device would be beneficial in healing his current ulcers and preventing future ulcerations of any bony areas of his feet and ankles.  Alvan Dame DPM

## 2014-01-25 NOTE — Progress Notes (Signed)
   Subjective:    Patient ID: [name], male    DOB: [birth year], 53 y.o.   MRN: [id number]  HPI Comments: Pt presents with dressing on the right 1st toe, and opened ulcer to the dorsal toe and scabbing to the lateral right foot.  Pt complains of a rubbing on the right lateral foot.     Review of Systems no new findings or systemic changes noted     Objective:   Physical Exam Lower extremity objective findings unchanged patient does have diminished neurovascular status is a wheelchair nonambulatory ulcer of the dorsum IP joint of the right hallux has a good pink granular base a centimeter and half in diameter no malodor no ascending psoas lymphangitis is noted patient has areas of eschar the malleoli are area lateral fifth MTP area left lateral fifth metatarsal base area right these eschars or pressure sores of been present for some time although only when is active currently draining is the IP joint of the right hallux. No malodor no secondary infection no increased temperature.       Assessment & Plan:  Assessment slow steady healing a stable ulceration of the right hallux the ulcer is cleansed with all cleansed Silvadene and gauze dressing was reapplied we'll continue Silvadene gauze dressings daily as instructed cleansing with soap and water as instructed worse or also given to obtain a heel lift her heel cradle or leg cramps the left the heel off the bed and prevent future ulceration. Recheck in one month for followup continue with local wound care as instructed next  Alvan Dame DPM

## 2014-02-08 ENCOUNTER — Ambulatory Visit
Admission: RE | Admit: 2014-02-08 | Discharge: 2014-02-08 | Disposition: A | Discharge status: Home / Self Care | Payer: Medicare Other | Source: Ambulatory Visit | Attending: Physical Medicine & Rehabilitation | Admitting: Physical Medicine & Rehabilitation

## 2014-02-08 DIAGNOSIS — M25561 Pain in right knee: Secondary | ICD-10-CM

## 2014-02-14 ENCOUNTER — Encounter: Payer: Medicare Other | Attending: Physical Medicine & Rehabilitation

## 2014-02-14 ENCOUNTER — Encounter: Payer: Self-pay | Admitting: Physical Medicine & Rehabilitation

## 2014-02-14 ENCOUNTER — Ambulatory Visit (HOSPITAL_BASED_OUTPATIENT_CLINIC_OR_DEPARTMENT_OTHER): Payer: Medicare Other | Admitting: Physical Medicine & Rehabilitation

## 2014-02-14 ENCOUNTER — Ambulatory Visit: Payer: Medicare Other | Admitting: Physical Medicine & Rehabilitation

## 2014-02-14 VITALS — BP 144/82 | HR 78 | Resp 14

## 2014-02-14 DIAGNOSIS — Z79899 Other long term (current) drug therapy: Secondary | ICD-10-CM | POA: Insufficient documentation

## 2014-02-14 DIAGNOSIS — E785 Hyperlipidemia, unspecified: Secondary | ICD-10-CM | POA: Insufficient documentation

## 2014-02-14 DIAGNOSIS — G809 Cerebral palsy, unspecified: Secondary | ICD-10-CM | POA: Diagnosis not present

## 2014-02-14 DIAGNOSIS — M25519 Pain in unspecified shoulder: Secondary | ICD-10-CM | POA: Diagnosis not present

## 2014-02-14 DIAGNOSIS — M25569 Pain in unspecified knee: Secondary | ICD-10-CM | POA: Diagnosis not present

## 2014-02-14 DIAGNOSIS — G808 Other cerebral palsy: Secondary | ICD-10-CM

## 2014-02-14 DIAGNOSIS — F79 Unspecified intellectual disabilities: Secondary | ICD-10-CM | POA: Insufficient documentation

## 2014-02-14 DIAGNOSIS — M25559 Pain in unspecified hip: Secondary | ICD-10-CM | POA: Diagnosis present

## 2014-02-14 NOTE — Progress Notes (Signed)
   Subjective:    Patient ID: [name], male    DOB: 25-Jul-1960, 53 y.o.   MRN: [id number]  HPI Knee pain doing better using diclofenac gel. Also not pulling up on bar as much. Right shoulder pain overall doing better as well.  Pain Inventory Average Pain 4 Pain Right Now 0 My pain is intermittent, dull, aching and tight  In the last 24 hours, has pain interfered with the following? General activity 4 Relation with others 4 Enjoyment of life 4 What TIME of day is your pain at its worst? daytime, night Sleep (in general) Fair  Pain is worse with: some activites and wheel chair Pain improves with: rest and medication Relief from Meds: 2  Mobility use a wheelchair  Function disabled: date disabled .  Neuro/Psych tingling  Prior Studies Any changes since last visit?  yes  Physicians involved in your care Any changes since last visit?  no   History reviewed. No pertinent family history. History   Social History  . Marital Status: Single    Spouse Name: N/A    Number of Children: N/A  . Years of Education: N/A   Social History Main Topics  . Smoking status: Never Smoker   . Smokeless tobacco: Never Used  . Alcohol Use: No  . Drug Use: No  . Sexual Activity: No   Other Topics Concern  . None   Social History Narrative  . None   Past Surgical History  Procedure Laterality Date  . Stomach surgery      as an infant "couldn't keep food down"   Past Medical History  Diagnosis Date  . 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 CEREBRAL PALSY 07/24/2006  . HYPERLIPIDEMIA 07/02/2010  . GASTRIC ULCER ACUTE WITH HEMORRHAGE 07/24/2006  . 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 URINARY INCONTINENCE 02/09/2010   BP 144/82  Pulse 78  Resp 14  SpO2 98%  Opioid Risk Score:   Fall Risk Score: Low Fall Risk (0-5 points) Review of Systems     Objective:   Physical Exam  Nursing note and vitals reviewed. Constitutional: He appears well-developed and well-nourished.   decreased  range of motion in the right shoulder. No pain with range of motion. Atrophy bilateral lower extremities. Knees without evidence of erythema or effusion. There is crepitus with range of motion in the right greater than left knee. No pain with range of motion. No pain with palpation        Assessment & Plan:  1. 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 Tone control is adequate for ADLs. Patient is dependent care at wheelchair level. 2. Right shoulder pain adhesive capsulitis overall improved with injection as well as medications 3. Right knee pain appears to be in the quadriceps tendon region. X-ray show medial compartment mild osteoarthritis. Both knees. Overall better with diclofenac gel.  #4. Spasticity control is good with 4 times a day baclofen 20 mg as well as 4 mg Zanaflex at night  RTC 3 month

## 2014-02-15 ENCOUNTER — Ambulatory Visit: Payer: Medicare Other | Admitting: Physical Medicine & Rehabilitation

## 2014-02-22 ENCOUNTER — Ambulatory Visit (INDEPENDENT_AMBULATORY_CARE_PROVIDER_SITE_OTHER): Payer: Medicare Other

## 2014-02-22 VITALS — BP 142/93 | HR 80 | Resp 16

## 2014-02-22 DIAGNOSIS — L03116 Cellulitis of left lower limb: Secondary | ICD-10-CM

## 2014-02-22 DIAGNOSIS — R609 Edema, unspecified: Secondary | ICD-10-CM

## 2014-02-22 DIAGNOSIS — L02419 Cutaneous abscess of limb, unspecified: Secondary | ICD-10-CM

## 2014-02-22 DIAGNOSIS — I739 Peripheral vascular disease, unspecified: Secondary | ICD-10-CM

## 2014-02-22 DIAGNOSIS — L03119 Cellulitis of unspecified part of limb: Secondary | ICD-10-CM

## 2014-02-22 DIAGNOSIS — L97509 Non-pressure chronic ulcer of other part of unspecified foot with unspecified severity: Secondary | ICD-10-CM

## 2014-02-22 MED ORDER — CEPHALEXIN 500 MG PO CAPS: 500.0000 mg | ORAL_CAPSULE | Freq: Three times a day (TID) | ORAL | Status: DC

## 2014-02-22 NOTE — Progress Notes (Signed)
Subjective:    Patient ID: [name], male    DOB: 12/19/1960, 53 y.o.   MRN: [id number]  HPI Comments: Patient presents for follow up of ulcers bilateral feet. The right foot is extremely red and swollen today.  Foot Ulcer - Follow up dorsal 1st right and lateral left foot      Review of Systems no new findings or systemic changes noted     Objective:   Physical Exam 53 year old male in a wheelchair power chair nonambulatory history of CP history of ischemic changes of both feet multiple areas of ulceration lateral feet appear to have good eschar tissue however for some of the right hallux with history of contusion laceration still has a 1 x 1.5 cm ulceration down to subcutaneous tissue level with a pink granular base. There is an area of cellulitis extending from the hallux the dorsum of the foot from hallux the midfoot area of dorsum right foot with slight increased temperature patient has no other systemic findings or signs of infection no purulence no malodor noted however localized cellulitis to the r right foot and hallux. No other changes medication her health status patient does have thready nonpalpable pedal pulses and wheelchair nonambulatory profound neuropathy noted as well       Assessment & Plan:  Assessment slow healing ulcer site or laceration of the right hallux IP joint there is cleansed with silk all cleansed dressed with Silvadene and gauze dressing he'll continue with Silvadene gauze dressings patient this time is placed on a regimen of cephalexin 500 mg 3 times a day x10 days recheck within 2-3 weeks for followup and wound check however there is any changes exacerbation or worsening or any systemic signs of infection needs to be seen at emergency room for possible IV antibiotic therapy is again slow healing ulcer right hallux due to severe ischemic changes in her peripheral vascular disease and complications and peripheral neuropathy. Next  Alvan Dame DPM

## 2014-03-12 ENCOUNTER — Encounter (HOSPITAL_COMMUNITY): Payer: Self-pay | Admitting: Emergency Medicine

## 2014-03-12 ENCOUNTER — Inpatient Hospital Stay (HOSPITAL_COMMUNITY)
Admission: EM | Admit: 2014-03-12 | Discharge: 2014-03-14 | DRG: 193 | Disposition: A | Discharge status: Home / Self Care | Payer: Medicare Other | Attending: Family Medicine | Admitting: Family Medicine

## 2014-03-12 ENCOUNTER — Emergency Department (HOSPITAL_COMMUNITY): Payer: Medicare Other

## 2014-03-12 DIAGNOSIS — J189 Pneumonia, unspecified organism: Secondary | ICD-10-CM | POA: Diagnosis not present

## 2014-03-12 DIAGNOSIS — J9811 Atelectasis: Secondary | ICD-10-CM | POA: Diagnosis present

## 2014-03-12 DIAGNOSIS — F79 Unspecified intellectual disabilities: Secondary | ICD-10-CM | POA: Diagnosis present

## 2014-03-12 DIAGNOSIS — E785 Hyperlipidemia, unspecified: Secondary | ICD-10-CM | POA: Diagnosis present

## 2014-03-12 DIAGNOSIS — Z23 Encounter for immunization: Secondary | ICD-10-CM

## 2014-03-12 DIAGNOSIS — M25519 Pain in unspecified shoulder: Secondary | ICD-10-CM

## 2014-03-12 DIAGNOSIS — E876 Hypokalemia: Secondary | ICD-10-CM | POA: Diagnosis present

## 2014-03-12 DIAGNOSIS — G629 Polyneuropathy, unspecified: Secondary | ICD-10-CM

## 2014-03-12 DIAGNOSIS — G808 Other cerebral palsy: Secondary | ICD-10-CM

## 2014-03-12 DIAGNOSIS — B372 Candidiasis of skin and nail: Secondary | ICD-10-CM | POA: Diagnosis present

## 2014-03-12 DIAGNOSIS — Z79899 Other long term (current) drug therapy: Secondary | ICD-10-CM

## 2014-03-12 DIAGNOSIS — L03115 Cellulitis of right lower limb: Secondary | ICD-10-CM

## 2014-03-12 DIAGNOSIS — Z8711 Personal history of peptic ulcer disease: Secondary | ICD-10-CM

## 2014-03-12 DIAGNOSIS — M549 Dorsalgia, unspecified: Secondary | ICD-10-CM | POA: Diagnosis not present

## 2014-03-12 DIAGNOSIS — G8250 Quadriplegia, unspecified: Secondary | ICD-10-CM | POA: Diagnosis present

## 2014-03-12 DIAGNOSIS — L97519 Non-pressure chronic ulcer of other part of right foot with unspecified severity: Secondary | ICD-10-CM | POA: Diagnosis present

## 2014-03-12 DIAGNOSIS — M545 Low back pain, unspecified: Secondary | ICD-10-CM

## 2014-03-12 DIAGNOSIS — L89890 Pressure ulcer of other site, unstageable: Secondary | ICD-10-CM | POA: Diagnosis present

## 2014-03-12 DIAGNOSIS — G809 Cerebral palsy, unspecified: Secondary | ICD-10-CM

## 2014-03-12 DIAGNOSIS — L8995 Pressure ulcer of unspecified site, unstageable: Secondary | ICD-10-CM | POA: Diagnosis present

## 2014-03-12 DIAGNOSIS — L89899 Pressure ulcer of other site, unspecified stage: Secondary | ICD-10-CM

## 2014-03-12 LAB — I-STAT CHEM 8, ED
BUN: 9 mg/dL (ref 6–23)
CALCIUM ION: 1.15 mmol/L (ref 1.12–1.23)
Chloride: 102 mEq/L (ref 96–112)
Creatinine, Ser: 0.5 mg/dL (ref 0.50–1.35)
Glucose, Bld: 145 mg/dL — ABNORMAL HIGH (ref 70–99)
HEMATOCRIT: 41 % (ref 39.0–52.0)
Hemoglobin: 13.9 g/dL (ref 13.0–17.0)
Potassium: 3.5 mEq/L — ABNORMAL LOW (ref 3.7–5.3)
SODIUM: 140 meq/L (ref 137–147)
TCO2: 24 mmol/L (ref 0–100)

## 2014-03-12 LAB — CBC WITH DIFFERENTIAL/PLATELET
BASOS ABS: 0 10*3/uL (ref 0.0–0.1)
Basophils Relative: 0 % (ref 0–1)
EOS ABS: 0.1 10*3/uL (ref 0.0–0.7)
Eosinophils Relative: 1 % (ref 0–5)
HCT: 39.6 % (ref 39.0–52.0)
Hemoglobin: 12.8 g/dL — ABNORMAL LOW (ref 13.0–17.0)
Lymphocytes Relative: 10 % — ABNORMAL LOW (ref 12–46)
Lymphs Abs: 1.5 10*3/uL (ref 0.7–4.0)
MCH: 27.6 pg (ref 26.0–34.0)
MCHC: 32.3 g/dL (ref 30.0–36.0)
MCV: 85.3 fL (ref 78.0–100.0)
Monocytes Absolute: 1.3 10*3/uL — ABNORMAL HIGH (ref 0.1–1.0)
Monocytes Relative: 9 % (ref 3–12)
NEUTROS PCT: 80 % — AB (ref 43–77)
Neutro Abs: 11.8 10*3/uL — ABNORMAL HIGH (ref 1.7–7.7)
PLATELETS: 303 10*3/uL (ref 150–400)
RBC: 4.64 MIL/uL (ref 4.22–5.81)
RDW: 14.7 % (ref 11.5–15.5)
WBC: 14.7 10*3/uL — AB (ref 4.0–10.5)

## 2014-03-12 LAB — I-STAT CG4 LACTIC ACID, ED: LACTIC ACID, VENOUS: 5.22 mmol/L — AB (ref 0.5–2.2)

## 2014-03-12 MED ORDER — DICLOFENAC SODIUM 50 MG PO TBEC
50.0000 mg | DELAYED_RELEASE_TABLET | Freq: Once | ORAL | Status: AC
  Administered 2014-03-12: 50 mg via ORAL
  Filled 2014-03-12: qty 1

## 2014-03-12 MED ORDER — DEXTROSE 5 % IV SOLN
1.0000 g | Freq: Once | INTRAVENOUS | Status: AC
  Administered 2014-03-13: 1 g via INTRAVENOUS
  Filled 2014-03-12: qty 10

## 2014-03-12 MED ORDER — SODIUM CHLORIDE 0.9 % IV BOLUS (SEPSIS)
1000.0000 mL | Freq: Once | INTRAVENOUS | Status: AC
  Administered 2014-03-13: 1000 mL via INTRAVENOUS

## 2014-03-12 MED ORDER — DEXTROSE 5 % IV SOLN
500.0000 mg | Freq: Once | INTRAVENOUS | Status: AC
  Administered 2014-03-13: 500 mg via INTRAVENOUS
  Filled 2014-03-12: qty 500

## 2014-03-12 MED ORDER — HYDROCODONE-ACETAMINOPHEN 5-325 MG PO TABS
1.0000 | ORAL_TABLET | Freq: Once | ORAL | Status: AC
  Administered 2014-03-12: 1 via ORAL
  Filled 2014-03-12: qty 1

## 2014-03-12 NOTE — ED Notes (Signed)
Patient transported to X-ray 

## 2014-03-12 NOTE — ED Notes (Signed)
Pt poor historian who presents to ED c/o RT sided shoulder pain and generalized back pain x 1 day. Pt states pain is worst when he sits up and when he coughs. AO x4. NAD noted. Pt denies N/V/D and SOB.

## 2014-03-12 NOTE — ED Provider Notes (Signed)
CSN: [id number]     Arrival date & time 03/12/14  2013 History   First MD Initiated Contact with Patient 03/12/14 2122     Chief Complaint  Patient presents with  . Back Pain  . Shoulder Pain     (Consider location/radiation/quality/duration/timing/severity/associated sxs/prior Treatment) HPI Comments: Patient presents from his group home for left-sided shoulder pain and back pain. He states that he's had pain here for a while but it is worsening over the past day. He denies any falls or trauma. Has pain with ranging the shoulder. Pain with twisting left back. I denies any midline focal pain. Denies any chest pain, shortness of breath, abdominal pain, nausea vomiting diarrhea, weakness, numbness. Has had history of adhesive capsulitis before and had shoulder injections in past on the opposite shoulder.   The history is provided by the patient.    Past Medical History  Diagnosis Date  . 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 CEREBRAL PALSY 07/24/2006  . HYPERLIPIDEMIA 07/02/2010  . GASTRIC ULCER ACUTE WITH HEMORRHAGE 07/24/2006  . 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 URINARY INCONTINENCE 02/09/2010   Past Surgical History  Procedure Laterality Date  . Stomach surgery      as an infant "couldn't keep food down"   History reviewed. No pertinent family history. History  Substance Use Topics  . Smoking status: Never Smoker   . Smokeless tobacco: Never Used  . Alcohol Use: No    Review of Systems  Constitutional: Negative for fever, activity change and appetite change.  HENT: Negative for congestion and rhinorrhea.   Eyes: Negative for discharge and itching.  Respiratory: Negative for cough, shortness of breath and wheezing.   Cardiovascular: Negative for chest pain.  Gastrointestinal: Negative for nausea, vomiting, abdominal pain, diarrhea and constipation.  Genitourinary: Negative for hematuria, decreased urine volume and difficulty urinating.  Musculoskeletal: Positive for back pain.  Skin:  Negative for rash and wound.  Neurological: Negative for syncope, weakness and numbness.  All other systems reviewed and are negative.     Allergies  Adhesive  Home Medications   Prior to Admission medications   Medication Sig Start Date End Date Taking? Authorizing Provider  baclofen (LIORESAL) 20 MG tablet Take 1 tablet (20 mg total) by mouth 4 (four) times daily. 12/17/13  Yes [name], MD  diclofenac sodium (VOLTAREN) 1 % GEL Apply 2 g topically 4 (four) times daily. 12/17/13  Yes [name], MD  divalproex (DEPAKOTE SPRINKLE) 125 MG capsule Take 500 mg by mouth 2 (two) times daily.    Yes Historical Provider, MD  Elastic Bandages & Supports (JOBST ANTI-EM KNEE HIGH MED) MISC by Does not apply route.   Yes Historical Provider, MD  Hydrocortisone Acetate 1 % CREA Apply 1 application topically daily. To face, every PM. 04/09/13  Yes Stephanie Coup Street, MD  Incontinence Supplies (MALE URINAL) MISC 1 Bottle by Does not apply route.   Yes Historical Provider, MD  ketoconazole (NIZORAL) 2 % shampoo Apply 1 application topically 2 (two) times a week.   Yes Historical Provider, MD  lansoprazole (PREVACID SOLUTAB) 30 MG disintegrating tablet Take 1 tablet (30 mg total) by mouth daily. 07/31/10  Yes Nestor Ramp, MD  LORazepam (ATIVAN) 1 MG tablet Take 1 mg by mouth every 4 (four) hours as needed for anxiety (anxiety).    Yes Historical Provider, MD  Multiple Vitamin (MULTIVITAMIN) capsule Take 1 capsule by mouth daily. 07/31/10 03/12/14 Yes Nestor Ramp, MD  risperiDONE (RISPERDAL) 2 MG tablet Take  2 mg by mouth at bedtime.   Yes Historical Provider, MD  tiZANidine (ZANAFLEX) 4 MG tablet Take 1 tablet (4 mg total) by mouth at bedtime. 12/17/13  Yes Erick ColaceAndrew E Kirsteins, MD  tolterodine (DETROL LA) 2 MG 24 hr capsule Take 1 capsule (2 mg total) by mouth daily. 04/08/13  Yes Stephanie Couphristopher M Street, MD  traMADol (ULTRAM) 50 MG tablet Take 1 tablet (50 mg total) by mouth every 6 (six) hours  as needed. For knee or joint pain. 11/23/13  Yes Stephanie Couphristopher M Street, MD  tretinoin (RETIN-A) 0.025 % gel Apply 1 applicator topically at bedtime. 04/09/13  Yes Stephanie Couphristopher M Street, MD  cephALEXin (KEFLEX) 500 MG capsule Take 1 capsule (500 mg total) by mouth 3 (three) times daily. 02/22/14   Richard Sikora, DPM   BP 120/70  Pulse 80  Temp(Src) 97.8 F (36.6 C) (Oral)  Resp 18  SpO2 97% Physical Exam  Vitals reviewed. Constitutional: He is oriented to person, place, and time. He appears well-developed and well-nourished. No distress.  Patient chronically ill appearing  HENT:  Head: Normocephalic and atraumatic.  Mouth/Throat: Oropharynx is clear and moist. No oropharyngeal exudate.  Eyes: Conjunctivae and EOM are normal. Pupils are equal, round, and reactive to light. Right eye exhibits no discharge. Left eye exhibits no discharge. No scleral icterus.  Neck: Normal range of motion. Neck supple.  Cardiovascular: Normal rate, regular rhythm, normal heart sounds and intact distal pulses.  Exam reveals no gallop and no friction rub.   No murmur heard. Pulmonary/Chest: Effort normal. No respiratory distress. He has no wheezes. He has no rales.  Bilateral rhonchi  Abdominal: Soft. He exhibits no distension and no mass. There is no tenderness.  Musculoskeletal:  Left shoulder with mild pain with range of motion Diffuse tenderness palpation Neurovascularly intact Diffuse upper thoracic tenderness to palpation without focal midline tenderness Diffuse lower thoracic and upper lumbar tenderness to palpation without focal midline tenderness  Neurological: He is alert and oriented to person, place, and time. No cranial nerve deficit. He exhibits abnormal muscle tone (hypertonic with multiple contractures inextremities ). Coordination normal.  Skin: Skin is warm. No rash noted. He is not diaphoretic.    ED Course  Procedures (including critical care time) Labs Review Labs Reviewed  CBC WITH  DIFFERENTIAL - Abnormal; Notable for the following:    WBC 14.7 (*)    Hemoglobin 12.8 (*)    Neutrophils Relative % 80 (*)    Neutro Abs 11.8 (*)    Lymphocytes Relative 10 (*)    Monocytes Absolute 1.3 (*)    All other components within normal limits  I-STAT CG4 LACTIC ACID, ED - Abnormal; Notable for the following:    Lactic Acid, Venous 5.22 (*)    All other components within normal limits  I-STAT CHEM 8, ED - Abnormal; Notable for the following:    Potassium 3.5 (*)    Glucose, Bld 145 (*)    All other components within normal limits  CULTURE, BLOOD (ROUTINE X 2)  CULTURE, BLOOD (ROUTINE X 2)    Imaging Review Dg Chest 1 View  03/12/2014   CLINICAL DATA:  Back pain in shoulder pain.  Initial encounter  EXAM: CHEST - 1 VIEW  COMPARISON:  10/03/2012  FINDINGS: There is new extensive opacification of the right chest.  Cardiomediastinal silhouette is markedly distorted by rightward rotation. There is a known large hiatal hernia. The left lung is well aerated.  IMPRESSION: Right-sided opacification primarily concerning for pneumonia or  aspiration (large hiatal hernia present). The study is limited due to patient positioning, suggest repeat for confirmation.   Electronically Signed   By: Tiburcio PeaJonathan  Watts M.D.   On: 03/12/2014 22:59   Dg Shoulder Left  03/12/2014   CLINICAL DATA:  Left shoulder pain. No known injury. Cerebral palsy.  EXAM: LEFT SHOULDER - 2+ VIEW  COMPARISON:  None.  FINDINGS: The examination is limited by limited ability of the patient to cooperate for positioning. Grossly normal appearing bones and soft tissues.  IMPRESSION: Limited examination with no gross abnormality.   Electronically Signed   By: Gordan PaymentSteve  Reid M.D.   On: 03/12/2014 22:57     EKG Interpretation None      MDM   MDM: 53 year-old male with past medical history of cerebral palsy and mild mental retardation comes in with left shoulder pain and left-sided back pain. Patient states that he's had pain  here before, denies any trauma or falls. Subacute pain likely MSK. X-rays obtained to rule out any bony pathology. No MSK pathology noted however has large right-sided infiltrate on chest x-ray. Patient without hypoxia, without respiratory symptoms, without fever. Labs added on shows leukocytosis and lactic acidosis. Patient treated for community acquired pneumonia with Rocephin and azithro. Will obtain chest CT to further characterize large infiltrate. Will admit to family medicine for further treatment. Admit.  Final diagnoses:  Community acquired pneumonia    Admit  Pilar Jarvisoug Nolon Yellin, MD 03/13/14 [phone]

## 2014-03-13 ENCOUNTER — Encounter (HOSPITAL_COMMUNITY): Payer: Self-pay | Admitting: Radiology

## 2014-03-13 ENCOUNTER — Inpatient Hospital Stay (HOSPITAL_COMMUNITY): Payer: Medicare Other

## 2014-03-13 DIAGNOSIS — G809 Cerebral palsy, unspecified: Secondary | ICD-10-CM

## 2014-03-13 DIAGNOSIS — J189 Pneumonia, unspecified organism: Secondary | ICD-10-CM | POA: Diagnosis present

## 2014-03-13 DIAGNOSIS — G808 Other cerebral palsy: Secondary | ICD-10-CM

## 2014-03-13 LAB — BASIC METABOLIC PANEL
ANION GAP: 13 (ref 5–15)
BUN: 7 mg/dL (ref 6–23)
CALCIUM: 9.1 mg/dL (ref 8.4–10.5)
CO2: 25 meq/L (ref 19–32)
Chloride: 102 mEq/L (ref 96–112)
Creatinine, Ser: 0.46 mg/dL — ABNORMAL LOW (ref 0.50–1.35)
GFR calc Af Amer: >90 mL/min (ref >90)
GFR calc non Af Amer: >90 mL/min (ref >90)
Glucose, Bld: 136 mg/dL — ABNORMAL HIGH (ref 70–99)
POTASSIUM: 3.6 meq/L — AB (ref 3.7–5.3)
SODIUM: 140 meq/L (ref 137–147)

## 2014-03-13 LAB — CBC
HCT: 35.9 % — ABNORMAL LOW (ref 39.0–52.0)
HEMOGLOBIN: 11.8 g/dL — AB (ref 13.0–17.0)
MCH: 27.2 pg (ref 26.0–34.0)
MCHC: 32.9 g/dL (ref 30.0–36.0)
MCV: 82.7 fL (ref 78.0–100.0)
PLATELETS: 277 10*3/uL (ref 150–400)
RBC: 4.34 MIL/uL (ref 4.22–5.81)
RDW: 14.6 % (ref 11.5–15.5)
WBC: 9.5 10*3/uL (ref 4.0–10.5)

## 2014-03-13 LAB — LACTIC ACID, PLASMA: LACTIC ACID, VENOUS: 2.9 mmol/L — AB (ref 0.5–2.2)

## 2014-03-13 MED ORDER — HYDROCORTISONE ACETATE 1 % EX CREA: 1.0000 "application " | TOPICAL_CREAM | Freq: Every day | CUTANEOUS | Status: DC

## 2014-03-13 MED ORDER — LANSOPRAZOLE 30 MG PO TBDP: 30.0000 mg | ORAL_TABLET | Freq: Every day | ORAL | Status: DC

## 2014-03-13 MED ORDER — TIZANIDINE HCL 4 MG PO TABS
4.0000 mg | ORAL_TABLET | Freq: Every day | ORAL | Status: DC
  Administered 2014-03-13: 4 mg via ORAL
  Filled 2014-03-13 (×2): qty 1

## 2014-03-13 MED ORDER — SODIUM CHLORIDE 0.9 % IJ SOLN
3.0000 mL | Freq: Two times a day (BID) | INTRAMUSCULAR | Status: DC
  Administered 2014-03-13 – 2014-03-14 (×2): 3 mL via INTRAVENOUS

## 2014-03-13 MED ORDER — MULTIVITAMINS PO CAPS: 1.0000 | ORAL_CAPSULE | Freq: Every day | ORAL | Status: DC

## 2014-03-13 MED ORDER — IOHEXOL 300 MG/ML  SOLN
80.0000 mL | Freq: Once | INTRAMUSCULAR | Status: AC | PRN
  Administered 2014-03-13: 80 mL via INTRAVENOUS

## 2014-03-13 MED ORDER — POTASSIUM CHLORIDE 10 MEQ/100ML IV SOLN
10.0000 meq | Freq: Once | INTRAVENOUS | Status: AC
  Administered 2014-03-13: 10 meq via INTRAVENOUS
  Filled 2014-03-13: qty 100

## 2014-03-13 MED ORDER — TRAMADOL HCL 50 MG PO TABS
50.0000 mg | ORAL_TABLET | Freq: Four times a day (QID) | ORAL | Status: DC | PRN
  Administered 2014-03-14: 50 mg via ORAL
  Filled 2014-03-13: qty 1

## 2014-03-13 MED ORDER — DEXTROSE 5 % IV SOLN
1.0000 g | INTRAVENOUS | Status: AC
  Administered 2014-03-13: 1 g via INTRAVENOUS
  Filled 2014-03-13: qty 10

## 2014-03-13 MED ORDER — PANTOPRAZOLE SODIUM 40 MG PO TBEC
40.0000 mg | DELAYED_RELEASE_TABLET | Freq: Every day | ORAL | Status: DC
  Administered 2014-03-13 – 2014-03-14 (×2): 40 mg via ORAL
  Filled 2014-03-13 (×2): qty 1

## 2014-03-13 MED ORDER — HYDROCORTISONE 1 % EX CREA
TOPICAL_CREAM | Freq: Every day | CUTANEOUS | Status: DC
  Administered 2014-03-13 – 2014-03-14 (×2): via TOPICAL
  Filled 2014-03-13: qty 28

## 2014-03-13 MED ORDER — HEPARIN SODIUM (PORCINE) 5000 UNIT/ML IJ SOLN
5000.0000 [IU] | Freq: Three times a day (TID) | INTRAMUSCULAR | Status: DC
  Administered 2014-03-13 – 2014-03-14 (×5): 5000 [IU] via SUBCUTANEOUS
  Filled 2014-03-13 (×7): qty 1

## 2014-03-13 MED ORDER — CLINDAMYCIN PHOSPHATE 600 MG/50ML IV SOLN
600.0000 mg | Freq: Three times a day (TID) | INTRAVENOUS | Status: DC
  Administered 2014-03-13 – 2014-03-14 (×4): 600 mg via INTRAVENOUS
  Filled 2014-03-13 (×6): qty 50

## 2014-03-13 MED ORDER — BACLOFEN 20 MG PO TABS
20.0000 mg | ORAL_TABLET | Freq: Four times a day (QID) | ORAL | Status: DC
  Administered 2014-03-13 – 2014-03-14 (×6): 20 mg via ORAL
  Filled 2014-03-13 (×8): qty 1

## 2014-03-13 MED ORDER — DICLOFENAC SODIUM 1 % TD GEL
2.0000 g | Freq: Four times a day (QID) | TRANSDERMAL | Status: DC
  Administered 2014-03-13 – 2014-03-14 (×6): 2 g via TOPICAL
  Filled 2014-03-13: qty 100

## 2014-03-13 MED ORDER — ADULT MULTIVITAMIN W/MINERALS CH
1.0000 | ORAL_TABLET | Freq: Every day | ORAL | Status: DC
  Administered 2014-03-13 – 2014-03-14 (×2): 1 via ORAL
  Filled 2014-03-13 (×2): qty 1

## 2014-03-13 MED ORDER — SODIUM CHLORIDE 0.45 % IV SOLN
INTRAVENOUS | Status: DC
  Administered 2014-03-13: 75 mL/h via INTRAVENOUS
  Administered 2014-03-13: 18:00:00 via INTRAVENOUS

## 2014-03-13 MED ORDER — RISPERIDONE 2 MG PO TABS
2.0000 mg | ORAL_TABLET | Freq: Every day | ORAL | Status: DC
  Administered 2014-03-13: 2 mg via ORAL
  Filled 2014-03-13 (×2): qty 1

## 2014-03-13 MED ORDER — POTASSIUM CHLORIDE CRYS ER 20 MEQ PO TBCR
20.0000 meq | EXTENDED_RELEASE_TABLET | Freq: Once | ORAL | Status: AC
  Administered 2014-03-13: 20 meq via ORAL
  Filled 2014-03-13: qty 1

## 2014-03-13 MED ORDER — NYSTATIN 100000 UNIT/GM EX POWD
Freq: Two times a day (BID) | CUTANEOUS | Status: DC
  Administered 2014-03-13 – 2014-03-14 (×3): via TOPICAL
  Filled 2014-03-13: qty 15

## 2014-03-13 MED ORDER — DIVALPROEX SODIUM 125 MG PO CPSP
500.0000 mg | ORAL_CAPSULE | Freq: Two times a day (BID) | ORAL | Status: DC
  Administered 2014-03-13 – 2014-03-14 (×3): 500 mg via ORAL
  Filled 2014-03-13 (×4): qty 4

## 2014-03-13 MED ORDER — LORAZEPAM 1 MG PO TABS: 1.0000 mg | ORAL_TABLET | ORAL | Status: DC | PRN

## 2014-03-13 MED ORDER — KETOCONAZOLE 2 % EX SHAM
1.0000 "application " | MEDICATED_SHAMPOO | CUTANEOUS | Status: DC
  Administered 2014-03-14: 1 via TOPICAL
  Filled 2014-03-13: qty 120

## 2014-03-13 MED ORDER — FESOTERODINE FUMARATE ER 4 MG PO TB24
4.0000 mg | ORAL_TABLET | Freq: Every day | ORAL | Status: DC
  Administered 2014-03-13 – 2014-03-14 (×2): 4 mg via ORAL
  Filled 2014-03-13 (×2): qty 1

## 2014-03-13 MED ORDER — WHITE PETROLATUM GEL
Freq: Every day | Status: DC
  Administered 2014-03-13: 12:00:00 via TOPICAL
  Filled 2014-03-13 (×3): qty 5

## 2014-03-13 NOTE — ED Provider Notes (Signed)
Medical screening examination/treatment/procedure(s) were conducted as a shared visit with non-physician practitioner(s) or resident  and myself.  I personally evaluated the patient during the encounter and agree with the findings.   I have personally reviewed any xrays and/ or EKG's with the provider and I agree with interpretation.   Patient presents with nonspecific left shoulder and back pain, no witnessed injury, patient had baseline mentally with his MR and CP history. No reported fevers, chills or breathing difficulty. Chest x-ray showed opacification, lactate elevated, rales on exam likely pneumonia. Blood work and CT chest ordered for further delineation. Patient admitted for further workup and treatment. Antibiotics given in ER. On exam decreased breath sounds and rales, no respiratory distress abdomen soft nontender. Patient has mild left shoulder pain with full range of motion, no swelling or warmth to the joint.  The patients results and plan were reviewed and discussed.   Any x-rays performed were personally reviewed by myself.   Differential diagnosis were considered with the presenting HPI.  Medications  azithromycin (ZITHROMAX) 500 mg in dextrose 5 % 250 mL IVPB (not administered)  iohexol (OMNIPAQUE) 300 MG/ML solution 80 mL (not administered)  diclofenac (VOLTAREN) EC tablet 50 mg (50 mg Oral Given 03/12/14 2253)  HYDROcodone-acetaminophen (NORCO/VICODIN) 5-325 MG per tablet 1 tablet (1 tablet Oral Given 03/12/14 2253)  sodium chloride 0.9 % bolus 1,000 mL (1,000 mLs Intravenous New Bag/Given 03/13/14 0035)  cefTRIAXone (ROCEPHIN) 1 g in dextrose 5 % 50 mL IVPB (0 g Intravenous Stopped 03/13/14 0056)    Filed Vitals:   03/12/14 2252 03/12/14 2300 03/12/14 2315 03/12/14 2330  BP: 124/73 119/71 126/73 120/70  Pulse: 85 83 81 80  Temp:      TempSrc:      Resp:      SpO2: 96% 95% 97% 97%    Final diagnoses:  Community acquired pneumonia  Left shoulder pain    [name] SkeensJoshua  M Izabel Chim, MD 03/13/14 0104

## 2014-03-13 NOTE — Progress Notes (Signed)
Called for report at 1252. Patients nurse with another patient. Cindee SaltMcBride,Shakeyla Giebler K, RN

## 2014-03-13 NOTE — H&P (Signed)
[facility] Admission History and Physical Service Pager: [phone]  Patient name: [name] Medical record number: [id number] Date of birth: 30-Aug-1960 Age: 53 y.o. Gender: male  Primary Care Provider: Maryjean KaStreet, Christopher, MD Consultants: None Code Status: FULL for now - Will need to confirm with supervisor at group home and sister who will be here in AM.  Chief Complaint: back and shoulder pain  Assessment and Plan: [name] is a 53 y.o. male presenting with back and shoulder pain . PMH is significant for cerebral palsy, quadriplegia, shoulder DJD, gastric ulcer, HLD, MR, pressure ulcer, and urinary incontinence.  Back and shoulder pain Xray findings consistent with CAP in right lung. Afebrile with normal vitals and satting well on RA. WBC 14.7 with left shift, lactic acid 5.22 - Stable, will admit to telemetry for observation - Cotninue IV abx azithro and CTX x 24-48 hours. Will add clinda for coverage for possible aspiration and order SLP. - Blood cultures x 2 obtained - after 1 dose CTX and azithro - IV fluids at 75cc/hr - Trend lactic acid - abdominal pain may contribute - CT chest pending - Pain: home tramadol and m relaxers - watch for characteristic rash of shingles - 2L O2 prn  Hypokalemia 3.5 - Kdur  - BMET in AM  New rash In groin, looks like candidal rash with moist area and beefy color - Nystatin powder to inguinal region  Right great toe ulcer With some extending erythema, though Judeth CornfieldStephanie (pt's care giver) reports he just finished course of keflex and it is looking better.  - WOC for right great toe and other foot ulcers - Change dressing daily - May need another course of abx. Could dose clinda to cover for both pulm and skin processes.  Abd pain - normal BM this morning, diarrhea recently  Viral gastro vs gas with mild, inconsistent, poorly-defined pain and nonacute abdomen that is soft. - fluids, monitor  Dry  flaking skin Around mouth with sores, around abdomen - Apply vaseline BID  Psych - Continue home risperdal, depakote, ativan   CP - Continue home baclofen, zanaflex, tiovaz,  - d/c to group home once stable - PT / OT eval and treat  FEN/GI: regular diet, IV fluids Prophylaxis:  Hep SQ - Requests  flu shot when feeling better. Suggested for outpatient.  Disposition: Admit to floor bed, discharge once received 1-2 days IV abx and feeling better  History of Present Illness: [name] is a 53 y.o. male presenting with back pain and shoulder pain on left x 1 day. Worst sitting up and coughing. Tramadol helped some but not long-term. Has been coughing to try to get stuff out but Ronney LionStephanie Edwards (caregiver) has not noticed him coughing.  Has felt cold, no recorded fevers. No abdominal pain, but occasional diarrhea (on and off 1 day, nonbloody), no vomiting. No shortness of breath. Has had pain in chest with cough. No new swelling, left leg chornically bigger. In wheelchair all day. Sitting most all day, recently laying back. Denies new neurologic problems or change, except "Everything a little slowed because does not feel well today". No sneezing. Lives in group home with 5 others, no one sick. New rash between groin.  Was on keflex 10 days for right great toe swelling at wound center. Improving. Finished last weekend).  In the ED, voltaren 50mg  PO, norco 5-325mg  once, 1L NS bolus, and azithromycin/CTX dosed.  Review Of Systems: Per HPI with the following additions: None  Otherwise 12 point review of systems was performed and was unremarkable.  Patient Active Problem List   Diagnosis Date Noted  . Community acquired pneumonia 03/13/2014  . Knee pain 11/23/2013  . Healthcare maintenance 06/24/2013  . 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 Onychomycosis 01/13/2013  . Neuropathy 01/13/2013  . Pressure ulcer 01/08/2013  . 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 Medication monitoring encounter  07/10/2011  . HYPERLIPIDEMIA 07/02/2010  . RENAL GLYCOSURIA 02/26/2010  . URINARY INCONTINENCE 02/09/2010  . 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 ECZEMA 06/09/2008  . 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 CEREBRAL PALSY 07/24/2006  . GASTRIC ULCER ACUTE WITH HEMORRHAGE 07/24/2006   Past Medical History: Past Medical History  Diagnosis Date  . 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 CEREBRAL PALSY 07/24/2006  . HYPERLIPIDEMIA 07/02/2010  . GASTRIC ULCER ACUTE WITH HEMORRHAGE 07/24/2006  . 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 URINARY INCONTINENCE 02/09/2010   Past Surgical History: Past Surgical History  Procedure Laterality Date  . Stomach surgery      as an infant "couldn't keep food down"   Social History: History  Substance Use Topics  . Smoking status: Never Smoker   . Smokeless tobacco: Never Used  . Alcohol Use: No   Additional social history: None Please also refer to relevant sections of EMR.  Family History: History reviewed. No pertinent family history. Allergies and Medications: Allergies  Allergen Reactions  . Adhesive [Tape] Rash   No current facility-administered medications on file prior to encounter.   Current Outpatient Prescriptions on File Prior to Encounter  Medication Sig Dispense Refill  . baclofen (LIORESAL) 20 MG tablet Take 1 tablet (20 mg total) by mouth 4 (four) times daily.  120 each  5  . diclofenac sodium (VOLTAREN) 1 % GEL Apply 2 g topically 4 (four) times daily.  3 Tube  2  . divalproex (DEPAKOTE SPRINKLE) 125 MG capsule Take 500 mg by mouth 2 (two) times daily.       . Elastic Bandages & Supports (JOBST ANTI-EM KNEE HIGH MED) MISC by Does not apply route.      . Hydrocortisone Acetate 1 % CREA Apply 1 application topically daily. To face, every PM.  30 g  2  . Incontinence Supplies (MALE URINAL) MISC 1 Bottle by Does not apply route.      . lansoprazole (PREVACID SOLUTAB) 30 MG disintegrating tablet Take 1 tablet  (30 mg total) by mouth daily.  90 tablet  3  . LORazepam (ATIVAN) 1 MG tablet Take 1 mg by mouth every 4 (four) hours as needed for anxiety (anxiety).       . Multiple Vitamin (MULTIVITAMIN) capsule Take 1 capsule by mouth daily.  30 capsule  11  . tiZANidine (ZANAFLEX) 4 MG tablet Take 1 tablet (4 mg total) by mouth at bedtime.  30 tablet  5  . tolterodine (DETROL LA) 2 MG 24 hr capsule Take 1 capsule (2 mg total) by mouth daily.  90 capsule  3  . traMADol (ULTRAM) 50 MG tablet Take 1 tablet (50 mg total) by mouth every 6 (six) hours as needed. For knee or joint pain.  50 tablet  1  . tretinoin (RETIN-A) 0.025 % gel Apply 1 applicator topically at bedtime.  45 g  2  . cephALEXin (KEFLEX) 500 MG capsule Take 1 capsule (500 mg total) by mouth 3 (three) times daily.  30 capsule  0  . [DISCONTINUED] tolterodine (DETROL LA) 2 MG 24 hr capsule  Take 1 capsule (2 mg total) by mouth daily.  90 capsule  3    Objective: BP 120/70  Pulse 80  Temp(Src) 97.8 F (36.6 C) (Oral)  Resp 18  SpO2 97% Exam: General: Lying in bed, contractured upper and lower extremities HEENT: AT/[place], dry flaking skin around mouth Cardiovascular: RRR, no m/r/g, 1+ B DP pulses Respiratory: Poor effort, no increased work, difficult to auscultate on back because patient unable to roll over, clear left lung, right anterior mid-chest with decreased breath sounds Abdomen: S/ND/mild TTP lower abdomen, difficult to discern when patient uncomfortable, stated not tender but just painful, nonacute, hypoactive BS Extremities: Contractured UE and LE bilaterally, no edema, bilateral feet with 3-5 visible 1cm open ulcers on toes, right great toe with covered draining 2cm ulcer at dorsal great toe, some yellow purulence Skin: Inguinal area with beefy moist appearance, itchy when palpated; suprapubic region flaky with very dry skin Neuro: Awake, alert, contractured, unable to move LE, moves UE purpusefully bilaterally but limited range and  ability, speech is appropriate though very slow and occasionally difficult to understand, EOMI  Labs and Imaging: CBC BMET   Recent Labs Lab 03/12/14 2328 03/12/14 2337  WBC 14.7*  --   HGB 12.8* 13.9  HCT 39.6 41.0  PLT 303  --     Recent Labs Lab 03/12/14 2337  NA 140  K 3.5*  CL 102  BUN 9  CREATININE 0.50  GLUCOSE 145*      DG chest 1 view: IMPRESSION:  Right-sided opacification primarily concerning for pneumonia or  aspiration (large hiatal hernia present). The study is limited due  to patient positioning, suggest repeat for confirmation.  DG shoulder Left: IMPRESSION:  Limited examination with no gross abnormality.   EKG with NSR, cannot rule out inferior infarct, no prior to compare    Leona Singleton, MD 03/13/2014, 12:49 AM PGY-3, [facility] FPTS Intern pager: [phone], text pages welcome

## 2014-03-13 NOTE — ED Notes (Signed)
Group Home representative requested that pt wait in ER room while she talks with her supervisor regarding pt being admitted to the hospital.

## 2014-03-13 NOTE — ED Notes (Signed)
Patient transported to CT 

## 2014-03-13 NOTE — Progress Notes (Signed)
UR completed 

## 2014-03-13 NOTE — Progress Notes (Signed)
PT Cancellation Note  Patient Details Name: [name] MRN: [id number] DOB: 1960/10/16   Cancelled Treatment:    Reason Eval/Treat Not Completed: Patient not medically ready;Other (comment) (pt. currently with strict bedrest orders)  Please update activity orders when appropriate so PT can be initiated.  Thank you,     Ferman HammingBlankenship, Kavari Parrillo B 03/13/2014, 1:44 PM Weldon PickingSusan Dierdra Salameh PT Acute Rehab Services [phone] [place] [phone]

## 2014-03-13 NOTE — Progress Notes (Signed)
0330 Patient arrived on unit via stretcher at 0255. Vital signs stable. Upon assessment small abrasions on left hip and a rash on legs bilaterally were noted. Skin peeling on buttocks, groin, penis, and legs. Buttocks, groin, penis also excoriated. Left outer foot ulcer darkened but blanches, 2 x1.4. Left ankle 6 x 3 1/2 hard discolored area. Right inner ankle 3 1/2 x 2 red and blanchable area except for the 1 x 1 area in center nonblanchable. Top of right foot red and peeling. Right great toe beefy red and open no noted drainage. Per podiatrists notes from September this is ulcer with cellulitis.  Wrapped with 4 x 4 and curlex. Toenails thick yellow/discolored. Patient has contractions of hands and feet. Barrier cream and condom cath applied. Wound consult has been ordered. Cindee SaltMcBride,Ehan Freas K, RN

## 2014-03-13 NOTE — H&P (Signed)
Seen and examined.  Agree with the documentation and management of Dr. Karie Schwalbe.  Patient admitted with signs of sig infection.  CXR suggests pneumonia although CT suggests atelectasis.  He needs a swallow study in that he may be at risk for aspiration.  Also source could be feet, which have breakdown/ulcers.  We will RX for CAP for now.  The decision will be what to DC him on - Clinda alone?

## 2014-03-14 DIAGNOSIS — J9811 Atelectasis: Secondary | ICD-10-CM | POA: Diagnosis not present

## 2014-03-14 DIAGNOSIS — L89890 Pressure ulcer of other site, unstageable: Secondary | ICD-10-CM | POA: Diagnosis not present

## 2014-03-14 DIAGNOSIS — Z79899 Other long term (current) drug therapy: Secondary | ICD-10-CM | POA: Diagnosis not present

## 2014-03-14 DIAGNOSIS — F79 Unspecified intellectual disabilities: Secondary | ICD-10-CM | POA: Diagnosis not present

## 2014-03-14 DIAGNOSIS — E876 Hypokalemia: Secondary | ICD-10-CM | POA: Diagnosis not present

## 2014-03-14 DIAGNOSIS — G8250 Quadriplegia, unspecified: Secondary | ICD-10-CM | POA: Diagnosis not present

## 2014-03-14 DIAGNOSIS — J189 Pneumonia, unspecified organism: Secondary | ICD-10-CM | POA: Diagnosis present

## 2014-03-14 DIAGNOSIS — L97519 Non-pressure chronic ulcer of other part of right foot with unspecified severity: Secondary | ICD-10-CM | POA: Diagnosis not present

## 2014-03-14 DIAGNOSIS — L8995 Pressure ulcer of unspecified site, unstageable: Secondary | ICD-10-CM | POA: Diagnosis not present

## 2014-03-14 DIAGNOSIS — M549 Dorsalgia, unspecified: Secondary | ICD-10-CM | POA: Diagnosis present

## 2014-03-14 DIAGNOSIS — E785 Hyperlipidemia, unspecified: Secondary | ICD-10-CM | POA: Diagnosis not present

## 2014-03-14 DIAGNOSIS — Z8711 Personal history of peptic ulcer disease: Secondary | ICD-10-CM | POA: Diagnosis not present

## 2014-03-14 DIAGNOSIS — B372 Candidiasis of skin and nail: Secondary | ICD-10-CM | POA: Diagnosis not present

## 2014-03-14 DIAGNOSIS — Z23 Encounter for immunization: Secondary | ICD-10-CM | POA: Diagnosis not present

## 2014-03-14 DIAGNOSIS — L03115 Cellulitis of right lower limb: Secondary | ICD-10-CM

## 2014-03-14 DIAGNOSIS — L89899 Pressure ulcer of other site, unspecified stage: Secondary | ICD-10-CM | POA: Diagnosis present

## 2014-03-14 LAB — CBC
HCT: 37.7 % — ABNORMAL LOW (ref 39.0–52.0)
HEMOGLOBIN: 12.3 g/dL — AB (ref 13.0–17.0)
MCH: 27.1 pg (ref 26.0–34.0)
MCHC: 32.6 g/dL (ref 30.0–36.0)
MCV: 83 fL (ref 78.0–100.0)
Platelets: 297 10*3/uL (ref 150–400)
RBC: 4.54 MIL/uL (ref 4.22–5.81)
RDW: 14.7 % (ref 11.5–15.5)
WBC: 4.7 10*3/uL (ref 4.0–10.5)

## 2014-03-14 LAB — BASIC METABOLIC PANEL
ANION GAP: 13 (ref 5–15)
BUN: 9 mg/dL (ref 6–23)
CALCIUM: 9.3 mg/dL (ref 8.4–10.5)
CO2: 26 mEq/L (ref 19–32)
Chloride: 103 mEq/L (ref 96–112)
Creatinine, Ser: 0.62 mg/dL (ref 0.50–1.35)
GFR calc Af Amer: >90 mL/min (ref >90)
GLUCOSE: 100 mg/dL — AB (ref 70–99)
POTASSIUM: 4.1 meq/L (ref 3.7–5.3)
SODIUM: 142 meq/L (ref 137–147)

## 2014-03-14 LAB — LACTIC ACID, PLASMA: Lactic Acid, Venous: 2.9 mmol/L — ABNORMAL HIGH (ref 0.5–2.2)

## 2014-03-14 MED ORDER — WHITE PETROLATUM GEL: 1.0000 "application " | Freq: Every day | Status: DC

## 2014-03-14 MED ORDER — INFLUENZA VAC SPLIT QUAD 0.5 ML IM SUSY
0.5000 mL | PREFILLED_SYRINGE | INTRAMUSCULAR | Status: AC
  Administered 2014-03-14: 0.5 mL via INTRAMUSCULAR
  Filled 2014-03-14: qty 0.5

## 2014-03-14 MED ORDER — CLINDAMYCIN HCL 150 MG PO CAPS
450.0000 mg | ORAL_CAPSULE | Freq: Four times a day (QID) | ORAL | Status: DC
  Filled 2014-03-14 (×3): qty 1

## 2014-03-14 MED ORDER — CETYLPYRIDINIUM CHLORIDE 0.05 % MT LIQD
7.0000 mL | Freq: Two times a day (BID) | OROMUCOSAL | Status: DC
  Administered 2014-03-14: 7 mL via OROMUCOSAL

## 2014-03-14 MED ORDER — NYSTATIN 100000 UNIT/GM EX POWD: CUTANEOUS | Status: DC

## 2014-03-14 MED ORDER — CLINDAMYCIN HCL 150 MG PO CAPS: 450.0000 mg | ORAL_CAPSULE | Freq: Four times a day (QID) | ORAL | Status: DC

## 2014-03-14 NOTE — Progress Notes (Signed)
Patient Discharge:  Disposition: Pt discharged back to group home  Education: Pt and Caregiver/ Group home manager Darl HouseholderKelley Weekley educated on pt diagnosis, medications, follow up appointments and all discharged instructions.  IV: Removed  Telemetry: Removed Central Tele notified  Follow-up appointments: Reviewed with caregiver  Prescriptions: Sent to pt pahrmacy  Transportation: Transported home via [facility] by caregiver  Belongings:Wheelchair and all other belongings taken with pt

## 2014-03-14 NOTE — Evaluation (Signed)
Clinical/Bedside Swallow Evaluation Patient Details  Name: [name] MRN: [id number] Date of Birth: Jun 07, 1960  Today's Date: 03/14/2014 Time: 1005-1031 SLP Time Calculation (min): 26 min  Past Medical History:  Past Medical History  Diagnosis Date  . 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 CEREBRAL PALSY 07/24/2006  . HYPERLIPIDEMIA 07/02/2010  . GASTRIC ULCER ACUTE WITH HEMORRHAGE 07/24/2006  . 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 URINARY INCONTINENCE 02/09/2010   Past Surgical History:  Past Surgical History  Procedure Laterality Date  . Stomach surgery      as an infant "couldn't keep food down"   HPI:  53 y.o. male presenting with back and shoulder pain . PMH is significant for cerebral palsy, quadriplegia, gastric ulcer, CAP, shoulder DJD, gastric ulcer, HLD, MR, pressure ulcer, and urinary incontinence.  OT spoke with group home who reported pt. consumes a regular texture diet and thin liquids without difficulty.  CXR 10/18 Significant distortion secondary to scoliosis and large hiatal hernia. Right base consolidation has an appearance more suggestive of atelectasis than infiltrate. Superimposed infection not excluded if of clinical concern.  Pt. reports he has "half a stomach."   Assessment / Plan / Recommendation Clinical Impression  Pt. is at increased risk of aspiration due to postural challenges related to cerebral palsy in addition to known large hiatal hernia and scoliosis.  Probable penetration/aspiration x 2 with straw sips thin evidenced by immediate cough, loud audible swallow.  Max-total cues needed for smaller straw sips with SLP removing straw from oral cavity.  Observed pill consumption with RN given thin and pill resulting in loud immediate cough with probable decreased airway protection due to decreased oral cohesion and control.  Pills whole in pudding appeared to mitigate risk.  SLP downgraded diet texture to Dys 3, continue thin straws (pt. unable to hold cup; feeder with cup  not recommended), with full supervision and strict adherence to precautions:  SMALL straw sips (removing straw when needed), sit upright, stay upright 30 minutes (min), pills whole in puree.  ST will follow may modify liquid consistency if needed.       Aspiration Risk   (mod-severe)    Diet Recommendation Dysphagia 3 (Mechanical Soft);Thin liquid   Liquid Administration via: Straw Medication Administration: Whole meds with puree Supervision: Full supervision/cueing for compensatory strategies;Staff to assist with self feeding;Trained caregiver to feed patient Compensations: Slow rate;Small sips/bites;Check for pocketing;Check for anterior loss Postural Changes and/or Swallow Maneuvers: Seated upright 90 degrees;Upright 30-60 min after meal    Other  Recommendations Oral Care Recommendations: Oral care BID   Follow Up Recommendations   (TBD)    Frequency and Duration min 2x/week  2 weeks   Pertinent Vitals/Pain No evidence pain        Swallow Study         Oral/Motor/Sensory Function Overall Oral Motor/Sensory Function:  (reduced strength and ROM- baseline CP)   Ice Chips Ice chips: Not tested   Thin Liquid Thin Liquid: Impaired Presentation: Straw Oral Phase Impairments:  (? decreased control) Oral Phase Functional Implications:  (premature spill over tongue) Pharyngeal  Phase Impairments: Suspected delayed Swallow;Cough - Immediate (loud audible swallow )    Nectar Thick Nectar Thick Liquid: Not tested   Honey Thick Honey Thick Liquid: Not tested   Puree Puree: Impaired Presentation: Spoon Pharyngeal Phase Impairments:  (loud audible swalllow)   Solid   GO Functional Assessment Tool Used: skilled clinical judgement Functional Limitations: Swallowing Swallow Current Status (D1497(G8996): At least 60 percent but less than 80 percent  impaired, limited or restricted Swallow Goal Status [phone](G8997): At least 40 percent but less than 60 percent impaired, limited or restricted   Solid: Impaired Oral Phase Impairments: Impaired mastication (reduced mastication; rotary pattern)       Roque CashLitaker, [name] 03/14/2014,11:10 AM  [name] M.Ed [employer]SLP Pager [phone]

## 2014-03-14 NOTE — Progress Notes (Signed)
I have seen and examined this patient. I have discussed with Dr Phelps.  I agree with their findings and plans as documented in their progress note.    

## 2014-03-14 NOTE — Progress Notes (Signed)
Noted in chart that pt continues to be on strict bedrest.  MDs, please update activity orders if appropriate so we can move forward with therapy evaluations. Thank you. Tory EmeraldHolly Naithan Delage, North CarolinaOTR/L [phone]

## 2014-03-14 NOTE — Evaluation (Signed)
Occupational Therapy Evaluation and Discharge Summary Patient Details Name: [name] MRN: [id number] DOB: [birth year] Today's Date: 03/14/2014    History of Present Illness Pt is a 53 yo male admitted with pneumonia.  Pt with PMH of CP, quadriplegia, shoulder DJD, and overall is dependent with most adls outside of feeding.     Clinical Impression   Pt admitted for the above diagnosis and has the deficits listed below. Pt appears to be at or very close to baseline in regards to ADLS at this time. Pt was dependent for all adls, according to group home staff, except for feeding.  Pt is currently feeding self.  No acute OT needs at this time considering pt has been dependent for adls for some time now.     Follow Up Recommendations  No OT follow up;Supervision/Assistance - 24 hour    Equipment Recommendations  3 in 1 bedside comode (said his BSC is broken)    Recommendations for Other Services       Precautions / Restrictions Precautions Precautions: Fall Restrictions Weight Bearing Restrictions: No      Mobility Bed Mobility Overal bed mobility: +2 for physical assistance;Needs Assistance Bed Mobility: Rolling Rolling: +2 for physical assistance;Total assist         General bed mobility comments: Pt contracted at hips and knees.  Is total assist for all mobility.  Transfers Overall transfer level: Needs assistance               General transfer comment: Did not get pt up today.  Pt is total assist x2 and has been for years.  Rec using lift.    Balance                                            ADL Overall ADL's : Needs assistance/impaired;At baseline Eating/Feeding: Set up;Sitting   Grooming: Moderate assistance;Sitting Grooming Details (indicate cue type and reason): assist with set up for brushing teeth and physical assist needed to comb hair, donn deodorant etc secondary to contratures in BUEs. Upper Body Bathing: Moderate  assistance;Sitting Upper Body Bathing Details (indicate cue type and reason): can do chest and lower arms. Lower Body Bathing: +2 for physical assistance;Total assistance;Bed level   Upper Body Dressing : Maximal assistance;Sitting   Lower Body Dressing: +2 for physical assistance;Total assistance;Bed level   Toilet Transfer: Total assistance;+2 for physical assistance;BSC (sometimes uses commode; other times goes in bed.)           Functional mobility during ADLs: +2 for physical assistance;Total assistance;Wheelchair General ADL Comments: Pt very dependent for most adls and is close if not at baseline other than feeling weak.     Vision                     Perception     Praxis      Pertinent Vitals/Pain Pain Assessment: Faces Faces Pain Scale: Hurts even more Pain Location: B arms with any ROM and bottom when being cleaned for bowel movement. Pain Descriptors / Indicators: Aching Pain Intervention(s): Monitored during session;Repositioned     Hand Dominance Left   Extremity/Trunk Assessment Upper Extremity Assessment Upper Extremity Assessment: RUE deficits/detail;LUE deficits/detail RUE Deficits / Details: Pt with significant contractures in RUE in hand, wrist,elbow, and shoulder.  Pt does not functionally use this extremity and has not for years. RUE: Unable to fully  assess due to pain RUE Coordination: decreased fine motor;decreased gross motor LUE Deficits / Details: Pt with PROM WFL given extra time to release some of tone in shoulder and elbow.  Grip 5/5.  DIfficult to assess strength in becipes and triceps due to tone but pt much more functional with LUE and drives power chair with L arm. LUE: Unable to fully assess due to pain LUE Coordination: decreased fine motor   Lower Extremity Assessment Lower Extremity Assessment: Defer to PT evaluation   Cervical / Trunk Assessment Cervical / Trunk Assessment: Kyphotic   Communication  Communication Communication: No difficulties   Cognition Arousal/Alertness: Awake/alert Behavior During Therapy: WFL for tasks assessed/performed Overall Cognitive Status: History of cognitive impairments - at baseline                     General Comments       Exercises       Shoulder Instructions      Home Living Family/patient expects to be discharged to:: Group home                                        Prior Functioning/Environment Level of Independence: Needs assistance  Gait / Transfers Assistance Needed: Pt slowly drives own power chair but not well per staff and pt.  Dependent on +2 total assist for transfers or a lift at group home. ADL's / Homemaking Assistance Needed: Pt dependent for most adls.  Pt can feed self. Staff grooms, bathes, dresses, and assists with toileting with pt. Communication / Swallowing Assistance Needed: Pt communicates well but not always accurate with yes no questions. Pt on regular diet at group home with no swallowing issues at baseline.      OT Diagnosis:     OT Problem List:     OT Treatment/Interventions:      OT Goals(Current goals can be found in the care plan section) Acute Rehab OT Goals Patient Stated Goal: to go back home.  OT Frequency:     Barriers to D/C:            Co-evaluation              End of Session Nurse Communication: Mobility status  Activity Tolerance: Patient tolerated treatment well Patient left: in bed;with call bell/phone within reach   Time: 0920-0950 OT Time Calculation (min): 30 min Charges:  OT General Charges $OT Visit: 1 Procedure OT Evaluation $Initial OT Evaluation Tier I: 1 Procedure OT Treatments $Self Care/Home Management : 23-37 mins G-Codes: OT G-codes **NOT FOR INPATIENT CLASS** Functional Assessment Tool Used: clinical judgement Functional Limitation: Self care Self Care Current Status (Z6109): At least 80 percent but less than 100 percent  impaired, limited or restricted Self Care Goal Status (U0454): At least 80 percent but less than 100 percent impaired, limited or restricted Self Care Discharge Status [phone]): At least 80 percent but less than 100 percent impaired, limited or restricted  Hope Budds 03/14/2014, 10:08 AM [phone]

## 2014-03-14 NOTE — Discharge Summary (Signed)
[facility] Summary  Patient name: [name] Medical record number: [id number] Date of birth: [birth year] Age: 53 y.o. Gender: male Date of Admission: 03/12/2014  Date of Discharge: [id number] Admitting Physician: Sanjuana LettersWilliam Arthur Hensel, MD  Primary Care Provider: Maryjean KaStreet, Christopher, MD Consultants: None  Indication for Hospitalization: Back and shoulder pain  Discharge Diagnoses/Problem List:  Patient Active Problem List   Diagnosis Date Noted  . Pressure ulcer of foot 03/14/2014  . Community acquired pneumonia 03/13/2014  . CAP (community acquired pneumonia) 03/13/2014  . Knee pain 11/23/2013  . Healthcare maintenance 06/24/2013  . 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 Onychomycosis 01/13/2013  . Neuropathy 01/13/2013  . Pressure ulcer 01/08/2013  . 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 Medication monitoring encounter 07/10/2011  . HYPERLIPIDEMIA 07/02/2010  . RENAL GLYCOSURIA 02/26/2010  . URINARY INCONTINENCE 02/09/2010  . 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 ECZEMA 06/09/2008  . 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 CEREBRAL PALSY 07/24/2006  . GASTRIC ULCER ACUTE WITH HEMORRHAGE 07/24/2006   Disposition: Discharge back to group home  Discharge Condition: Stable  Discharge Exam: See Progress Note  Brief Hospital Course:  [name] is a 53 y.o. male who presented with back and shoulder pain . PMH is significant for cerebral palsy, quadriplegia, shoulder DJD, gastric ulcer, HLD, MR, pressure ulcer, and urinary incontinence.   His hospital course by problem is listed below:   Back and shoulder pain-Xray findings consistent with CAP in right lung but CT shows atelectasis . Afebrile with normal vitals and satting well on RA. WBC 14.7 with left shift, lactic acid 5.22->2.9. S/p IV abx azithro and CTX. Added clindamycin for coverage for possible aspiration. Patient remained  stable. Hypokalemia 3.5  Resolved with most recent 4.1; s/p Kdur  New rash In groin, looks like candidal rash with moist area and beefy color. Nystatin powder to inguinal region ordered. Foot Ulcers With some extending erythema noted. Patient finished course of keflex and it is looking better. WOC consulted and recommended silver gel to the right great toe to maintain moisture and treat any bioburdan presence, applied daily. The other areas of concern we will use silicone foam to protect from further injury. Pt reports his feet dangling and that he bumps them. Prevalon boots ordered for this patient to use while in bed to protect the feet. Patient has outpatient follow-up with wound care. Clindamycin to cover for infection as above. Abd pain - normal BM this morning, but has had some diarrhea  Viral gastro vs gas with mild, inconsistent, poorly-defined pain and nonacute abdomen that is soft.  - fluids, monitor  Dry flaking skin Around mouth with sores and around abdomen. Applied vaseline BID. Psych- Continued home risperdal, depakote, ativan  CP - Continued home baclofen, zanaflex, and tiovaz. Patient needs 24/hr supervision.   Issues for Follow Up:  1. Needs wound care follow-up [facility]. [facility]  Significant Procedures: None  Significant Labs and Imaging:   Recent Labs Lab 03/12/14 2328 03/12/14 2337 03/13/14 0459 03/14/14 0532  WBC 14.7*  --  9.5 4.7  HGB 12.8* 13.9 11.8* 12.3*  HCT 39.6 41.0 35.9* 37.7*  PLT 303  --  277 297    Recent Labs Lab 03/12/14 2337 03/13/14 0459 03/14/14 0532  NA 140 140 142  K 3.5* 3.6* 4.1  CL 102 102 103  CO2  --  25 26  GLUCOSE 145* 136* 100*  BUN 9 7 9  CREATININE 0.50 0.46* 0.62  CALCIUM  --  9.1 9.3   Lactic acid: 5.22->2.9  DG chest 1 view: IMPRESSION:  Right-sided opacification primarily concerning for pneumonia or  aspiration (large hiatal hernia present). The study is limited due  to patient positioning, suggest  repeat for confirmation.   DG shoulder Left: IMPRESSION:  Limited examination with no gross abnormality.   EKG with NSR, cannot rule out inferior infarct, no prior to compare  Results/Tests Pending at Time of Discharge: None  Discharge Medications:    Medication List         baclofen 20 MG tablet  Commonly known as:  LIORESAL  Take 1 tablet (20 mg total) by mouth 4 (four) times daily.     cephALEXin 500 MG capsule  Commonly known as:  KEFLEX  Take 1 capsule (500 mg total) by mouth 3 (three) times daily.     clindamycin 150 MG capsule  Commonly known as:  CLEOCIN  Take 3 capsules (450 mg total) by mouth every 6 (six) hours.     diclofenac sodium 1 % Gel  Commonly known as:  VOLTAREN  Apply 2 g topically 4 (four) times daily.     divalproex 125 MG capsule  Commonly known as:  DEPAKOTE SPRINKLE  Take 500 mg by mouth 2 (two) times daily.     Hydrocortisone Acetate 1 % Crea  Apply 1 application topically daily. To face, every PM.     JOBST ANTI-EM KNEE HIGH MED Misc  by Does not apply route.     ketoconazole 2 % shampoo  Commonly known as:  NIZORAL  Apply 1 application topically 2 (two) times a week.     lansoprazole 30 MG disintegrating tablet  Commonly known as:  PREVACID SOLUTAB  Take 1 tablet (30 mg total) by mouth daily.     LORazepam 1 MG tablet  Commonly known as:  ATIVAN  Take 1 mg by mouth every 4 (four) hours as needed for anxiety (anxiety).     Male Urinal Misc  1 Bottle by Does not apply route.     multivitamin capsule  Take 1 capsule by mouth daily.     nystatin 100000 UNIT/GM Powd  Apply to inguinal area between legs daily until rash resolved.     risperiDONE 2 MG tablet  Commonly known as:  RISPERDAL  Take 2 mg by mouth at bedtime.     tiZANidine 4 MG tablet  Commonly known as:  ZANAFLEX  Take 1 tablet (4 mg total) by mouth at bedtime.     tolterodine 2 MG 24 hr capsule  Commonly known as:  DETROL LA  Take 1 capsule (2 mg total) by  mouth daily.     traMADol 50 MG tablet  Commonly known as:  ULTRAM  Take 1 tablet (50 mg total) by mouth every 6 (six) hours as needed. For knee or joint pain.     tretinoin 0.025 % gel  Commonly known as:  RETIN-A  Apply 1 applicator topically at bedtime.     white petrolatum Gel  Commonly known as:  VASELINE  Apply 1 application topically daily.        Discharge Instructions: Please refer to Patient Instructions section of EMR for full details.  Patient was counseled important signs and symptoms that should prompt return to medical care, changes in medications, dietary instructions, activity restrictions, and follow up appointments.   Follow-Up Appointments: Follow-up Information   Follow up with Gildardo CrankerHess, Bryan, DO On 03/17/2014. (at  2:30 pm for hospital follow up. Please arrive 10 minutes early.)    Specialty:  Family Medicine   Contact information:   [address] [place] [address] [phone]       Schedule an appointment as soon as possible for a visit with Wound Care at your facility this week.      Pincus Large, DO 03/14/2014, 7:56 PM PGY-1, [facility]

## 2014-03-14 NOTE — Clinical Social Work Psychosocial (Addendum)
Clinical Social Work Department BRIEF PSYCHOSOCIAL ASSESSMENT 03/14/2014  Patient:  [name],[name]     Account Number:  [id number]     Admit date:  03/12/2014  Clinical Social Worker:  [name],[name] Junio, LCSW  Date/Time:  03/14/2014 02:37 AM  Referred by:  Physician  Date Referred:  03/14/2014 Referred for  ALF Placement   Other Referral:   Patient from Group Home   Interview type:  Family Other interview type:    PSYCHOSOCIAL DATA Living Status:  FACILITY Admitted from facility:  Other Level of care:  Group Home Primary support name:  [name] Primary support relationship to patient:  SIBLING Degree of support available:   Good support form sister who is legal guardian of patient.    CURRENT CONCERNS Current Concerns  Post-Acute Placement   Other Concerns:    SOCIAL WORK ASSESSMENT / PLAN CSW reviewed EPIC and shadow chart regarding where patient admitted from. Call made to patient's sister [name] to confirm return to Group Home and this is the plan per sister. CSW attempted to reach staff person [name] at group home [phone]), however no answer and no voice mail. CSW attempted contacted again with group home at 2:47 pm and spoke with staff person [name] and group home director [name] regarding patient. CSW informed that they are coming to hospital this afternoon and will transport patient back to group home.   Assessment/plan status:   Other assessment/ plan:   Information/referral to community resources:   None needed or requested at this time.    PATIENT'S/FAMILY'S RESPONSE TO PLAN OF CARE: Sister informed that patient may discharge today and requested contact person for group home, which she supplied. CSW assured sister that she will be contacted if patient does discharge today.

## 2014-03-14 NOTE — Progress Notes (Signed)
Patient ID: [name], male   DOB: 1960/09/04, 53 y.o.   MRN: [id number] Family Medicine Teaching Service Daily Progress Note Intern Pager: [phone]  Patient name: [name] Rhineerry L Kirtley Medical record number: [id number] Date of birth: 1960/09/04 Age: 53 y.o. Gender: male  Primary Care Provider: Maryjean KaStreet, Christopher, MD Consultants: None Code Status: FULL  Pt Overview and Major Events to Date:  10/17: Admitted for back and shoulder pain  Assessment and Plan: [name] Rhineerry L Solinger is a 53 y.o. male presenting with back and shoulder pain . PMH is significant for cerebral palsy, quadriplegia, shoulder DJD, gastric ulcer, HLD, MR, pressure ulcer, and urinary incontinence.   Back and shoulder pain-Xray findings consistent with CAP in right lung but CT shows atelectasis . Afebrile with normal vitals and satting well on RA. WBC 14.7 with left shift, lactic acid 5.22->2.9.  - Stable - s/p IV abx azithro and CTX x 24-48 hours.  -added clinda for coverage for possible aspiration and order SLP.  - Blood cultures x 2 obtained - after 1 dose CTX and azithro  - IV fluids at 75cc/hr  - Trend lactic acid  - abdominal pain may contribute  - CT chest showing atelectasis vs CAP - Pain: home tramadol and muslce relaxers  - watch for characteristic rash of shingles - not present currently - 2L O2 prn   Hypokalemia 3.5 - Resolved now 4.1 this AM; s/p Kdur  New rash  In groin, looks like candidal rash with moist area and beefy color  - Nystatin powder to inguinal region   Right great toe ulcer  With some extending erythema, though Judeth CornfieldStephanie (pt's care giver) reports he just finished course of keflex and it is looking better.  - WOC for right great toe and other foot ulcers - see their note for recs - Change dressing daily  - May need another course of abx. Could dose clinda to cover for both pulm and skin processes.   Abd pain - normal BM this morning, but has had some diarrhea Viral gastro vs gas with mild,  inconsistent, poorly-defined pain and nonacute abdomen that is soft.  - fluids, monitor   Dry flaking skin  Around mouth with sores, around abdomen  - Apply vaseline BID   Psych - Continue home risperdal, depakote, ativan   CP  - Continue home baclofen, zanaflex, tiovaz,  - PT / OT eval and treat; appreciate recs  FEN/GI: regular diet, IV fluids  Prophylaxis: Hep SQ  - Requests flu shot when feeling better. Suggested for outpatient.  Disposition: Discharge back to group home today  Subjective:  Patient doing well this morning. He has no complaints. Says his abdominal pain has improved but still uncomfortable at times. He is willing to get discharged today.  Objective: Temp:  [98 F (36.7 C)-98.4 F (36.9 C)] 98.3 F (36.8 C) (10/19 0505) Pulse Rate:  [82-88] 82 (10/19 0505) Resp:  [16-18] 16 (10/19 0505) BP: (104-135)/(58-70) 125/70 mmHg (10/19 0505) SpO2:  [96 %-98 %] 98 % (10/19 0505) Weight:  [177 lb 3.2 oz (80.377 kg)] 177 lb 3.2 oz (80.377 kg) (10/18 2039) Physical Exam: General: Lying in bed, contractured upper and lower extremities  Cardiovascular: RRR, no m/r/g, 1+ B DP pulses  Respiratory: Poor effort, no increased work, difficult to auscultate on back because patient unable to roll over, clear left lung, right anterior mid-chest with decreased breath sounds  Abdomen: S/ND/mild TTP lower abdomen, difficult to discern when patient uncomfortable, stated not tender but just painful,  nonacute, hypoactive BS  Extremities: Contractured UE and LE bilaterally, no edema, bilateral feet with 3-5 visible 1cm open ulcers on toes, right great toe with covered draining 2cm ulcer at dorsal great toe, some yellow purulence. unstageable ulcer to left lateral foot due to eschar.   Neuro: Awake, alert, contractured, unable to move LE, moves UE purpusefully bilaterally but limited range and ability, speech is appropriate though very slow and occasionally difficult to understand,  EOMI  Laboratory:  Recent Labs Lab 03/12/14 2328 03/12/14 2337 03/13/14 0459 03/14/14 0532  WBC 14.7*  --  9.5 4.7  HGB 12.8* 13.9 11.8* 12.3*  HCT 39.6 41.0 35.9* 37.7*  PLT 303  --  277 297    Recent Labs Lab 03/12/14 2337 03/13/14 0459 03/14/14 0532  NA 140 140 142  K 3.5* 3.6* 4.1  CL 102 102 103  CO2  --  25 26  BUN 9 7 9   CREATININE 0.50 0.46* 0.62  CALCIUM  --  9.1 9.3  GLUCOSE 145* 136* 100*   Lactic acid 5.22 ->2.9  Blood culture NGTD  Imaging/Diagnostic Tests: DG chest 1 view: IMPRESSION:  Right-sided opacification primarily concerning for pneumonia or  aspiration (large hiatal hernia present). The study is limited due  to patient positioning, suggest repeat for confirmation.   DG shoulder Left: IMPRESSION:  Limited examination with no gross abnormality.   EKG with NSR, cannot rule out inferior infarct, no prior to compare   Pincus LargeJazma Y Phelps, DO 03/14/2014, 8:19 AM PGY-1, [facility] FPTS Intern pager: [phone], text pages welcome

## 2014-03-14 NOTE — Clinical Social Work Note (Signed)
CSW has attempted to reach MD (via pager) without success regarding signing FL-2 and discharge summary. FL-2 completed, however in conversation today with UGI CorporationKelly Weekly, group home manager, they have a recent FL-2 so does not need FL-2 completed by CSW. As of 4:22 pm d/c summary still incomplete, however AVS discharge instructions completed and this will be given to group home manager.  CSW signing off as patient discharging back to group home today.  Genelle BalVanessa Treniyah Lynn, MSW, LCSW [phone]

## 2014-03-14 NOTE — Discharge Instructions (Signed)
You were in the hospital with a presumed pneumonia and we also treated your foot infection. Your symptoms greatly improved by the time you left. Continue antibiotic for 7 days (clindmycin) which will also help your foot. Seek immediate care if you develop worsened foot pain or swelling, fevers, trouble breathing, cough with worse sputum, chest pain, or other concerns.  Best,  Leona SingletonMaria T Desia Saban, MD

## 2014-03-14 NOTE — Evaluation (Addendum)
Physical Therapy Evaluation and Discharge Patient Details Name: [name] MRN: [id number] DOB: 1960/08/17 Today's Date: 03/14/2014   History of Present Illness  Pt is a 53 yo male admitted with pneumonia.  Pt with PMH of CP, quadriplegia, shoulder DJD, and overall is dependent with most adls outside of feeding.    Clinical Impression  Patient evaluated by Physical Therapy with no further acute PT needs identified. All education has been completed and the patient has no further questions. At the time of PT eval pt states he is at baseline of function. Pt was feeding self when PT entered the room, and states that at the group home, he has total assist to transfer to his power wheelchair. See below for any follow-up Physial Therapy or equipment needs. PT is signing off. Thank you for this referral.       Follow Up Recommendations No PT follow up;Supervision/Assistance - 24 hour    Equipment Recommendations  None recommended by PT    Recommendations for Other Services       Precautions / Restrictions Precautions Precautions: Fall Restrictions Weight Bearing Restrictions: No       Pertinent Vitals/Pain Pain Assessment: Faces Faces Pain Scale: Hurts little more Pain Location: Pt appears painful when asking to perform active movement of LE's in bed Pain Intervention(s): Monitored during session    Home Living Family/patient expects to be discharged to:: Group home                      Prior Function Level of Independence: Needs assistance   Gait / Transfers Assistance Needed: Pt slowly drives own power chair but not well per staff and pt.  Dependent on +2 total assist for transfers or a lift at group home.  ADL's / Homemaking Assistance Needed: Pt dependent for most adls.  Pt can feed self. Staff grooms, bathes, dresses, and assists with toileting with pt.        Hand Dominance   Dominant Hand: Left    Extremity/Trunk Assessment   Upper Extremity  Assessment: Defer to OT evaluation           Lower Extremity Assessment: Generalized weakness;RLE deficits/detail;LLE deficits/detail RLE Deficits / Details: Pes planus and decreased ankle AROM. Pt reports increased difficulty to perform AROM with R vs. L. Needs max assist for RLE movement.  LLE Deficits / Details: Pes planus and decreased ankle AROM. Pt able to demonstrate some muscle activation in quads but requires mod to max assist for full knee extension (SAQ).   Cervical / Trunk Assessment: Kyphotic  Communication   Communication: No difficulties  Cognition Arousal/Alertness: Awake/alert Behavior During Therapy: WFL for tasks assessed/performed Overall Cognitive Status: History of cognitive impairments - at baseline                              Assessment/Plan    PT Assessment Patent does not need any further PT services  PT Diagnosis Generalized weakness;Difficulty walking   PT Problem List    PT Treatment Interventions     PT Goals (Current goals can be found in the Care Plan section) Acute Rehab PT Goals PT Goal Formulation: All assessment and education complete, DC therapy    Frequency     Barriers to discharge        Co-evaluation               End of Session   Activity Tolerance: Patient  limited by fatigue Patient left: in bed;with call bell/phone within reach Nurse Communication: Mobility status;Need for lift equipment         Time: 1914-7829 PT Time Calculation (min): 23 min   Charges:   PT Evaluation $Initial PT Evaluation Tier I: 1 Procedure PT Treatments $Therapeutic Activity: 8-22 mins   PT G Codes:          Conni Slipper 03/14/2014, 2:50 PM  Conni Slipper, PT, DPT Acute Rehabilitation Services Pager: [phone]

## 2014-03-14 NOTE — Consult Note (Signed)
WOC wound consult note Reason for Consult: evaluation of foot wound.  Pt somewhat of poor historian and a bit scattered in his comments but he does report that he has some "sores on his feet".  He states that he does not wear shoes due to these areas.  He is chair bound.  He has foot drop bilaterally.  He reports he has leg rest on his wheel chair but most of the time his feet dangle.  I suspect these injuries are related to repetitive  Trauma. However the area on the lateral and medial left foot may be related to pressure based on how the foot has been positioned most of the time.  Wound type:  Unstageable pressure ulcer: left lateral foot Trauma: right great toe Trauma: right lateral foot Healed areas medial left and right malleolus  Pressure Ulcer POA: Yes Measurement: Right great toe: 1.5cm x 2.0cm x 0.2cm Left lateral foot: 1.5cm x 1.0cm x 0 Wound bed:  Right great toe: clean, but some crusting, red Left lateral foot: eschar 100% Drainage (amount, consistency, odor) minimal at right great toe, no other areas of drainage noted  Periwound: intact, some mild erythema at the great toe  Dressing procedure/placement/frequency: Silver gel to the right great toe to maintain moisture and treat any bioburdan presence, applied daily. Cover with dry dressing. The other areas of concern we will use silicone foam to protect from further injury.  Pt reports his feet dangling and that he bumps them.  I have ordered Prevalon boots for this patient to use while in bed to protect the feet.   Discussed POC with patient and bedside nurse.  Re consult if needed, will not follow at this time. Thanks  Maecy Podgurski [employer] RN, CWOCN [phone])

## 2014-03-15 ENCOUNTER — Ambulatory Visit (INDEPENDENT_AMBULATORY_CARE_PROVIDER_SITE_OTHER): Payer: Medicare Other

## 2014-03-15 VITALS — BP 124/75 | HR 83 | Temp 98.8°F | Resp 14

## 2014-03-15 DIAGNOSIS — L97501 Non-pressure chronic ulcer of other part of unspecified foot limited to breakdown of skin: Secondary | ICD-10-CM

## 2014-03-15 DIAGNOSIS — L89891 Pressure ulcer of other site, stage 1: Secondary | ICD-10-CM

## 2014-03-15 DIAGNOSIS — I739 Peripheral vascular disease, unspecified: Secondary | ICD-10-CM

## 2014-03-15 DIAGNOSIS — S90121S Contusion of right lesser toe(s) without damage to nail, sequela: Secondary | ICD-10-CM

## 2014-03-15 DIAGNOSIS — G629 Polyneuropathy, unspecified: Secondary | ICD-10-CM

## 2014-03-15 NOTE — Patient Instructions (Signed)
ANTIBACTERIAL SOAP INSTRUCTIONS  THE DAY AFTER PROCEDURE  Please follow the instructions your doctor has marked.   Shower as usual. Before getting out, place a drop of antibacterial liquid soap (Dial) on a wet, clean washcloth.  Gently wipe washcloth over affected area.  Afterward, rinse the area with warm water.  Blot the area dry with a soft cloth and cover with antibiotic ointment (neosporin, polysporin, bacitracin) and band aid or gauze and tape  Place 3-4 drops of antibacterial liquid soap in a quart of warm tap water.  Submerge foot into water for 20 minutes.  If bandage was applied after your procedure, leave on to allow for easy lift off, then remove and continue with soak for the remaining time.  Next, blot area dry with a soft cloth and cover with a bandage.  Apply other medications as directed by your doctor, such as cortisporin otic solution (eardrops) or neosporin antibiotic ointment  Continue daily cleansing or washing foot with antibacterial soap and warm water dried thoroughly apply Silvadene and gauze dressing utilizing paper tape, may substitute foam dressing pads or Mepitel pads maintain Silvadene dressings daily as instructed until resolved.

## 2014-03-15 NOTE — Progress Notes (Signed)
   Subjective:    Patient ID: [name], male    DOB: May 31, 1960, 53 y.o.   MRN: [id number]  HPI Comments: Pt presents for follow-up care of ulcers to right 1st dorsal toe, lateral foot and left medial ankle and dorsal 5th MPJ.  Pt has been hospitalized for pneumonia and has had a change in the antibiotic therapy, changed to Cephalexin 566m tid.      Review of Systems no new findings or systemic changes noted    Objective:   Physical Exam Neurovascular status unchanged history of CP patient nonambulatory has severe ischemic ulcerations of both feet dorsum of the hallux right has a laceration oh centimeter by 1.5 cm ulceration of excoriation with mild pinpoint bleeding debridement. There is ulceration fifth metatarsal heads bilateral fifth met base lateral right and medial malleolar area of the left ankle. Multiple areas are cleansed and with all cleansed with wound cleanser and Silvadene gauze dressings applied to each of the sites at this time. The ulcer of the hallux area is debrided down to good pink granular base mild eschar tissue was identified       Assessment & Plan:  Assessment neuropathic ischemic ulceration of both feet secondary to minor injury trauma uKoreato healing due to vascular compromise noted continue with Silvadene and gauze dressings or foam dressings with Silvadene dressing changes daily reevaluate with the next 4 weeks for followup best to contact immediately if any change exacerbation or worsening any signs of fever chills patient was recently treated in the hospital for pneumonia and has been antibiotic regimen no additional antibiotic needed at this time.  RHarriet MassonDPM

## 2014-03-15 NOTE — Discharge Summary (Signed)
I have seen and examined this patient. I have discussed with Dr Phelps.  I agree with their findings and plans as documented in their discharge note.    

## 2014-03-17 ENCOUNTER — Encounter: Payer: Self-pay | Admitting: Family Medicine

## 2014-03-17 ENCOUNTER — Ambulatory Visit (INDEPENDENT_AMBULATORY_CARE_PROVIDER_SITE_OTHER): Payer: Medicare Other | Admitting: Family Medicine

## 2014-03-17 VITALS — BP 105/59 | HR 75 | Temp 98.9°F

## 2014-03-17 DIAGNOSIS — J189 Pneumonia, unspecified organism: Secondary | ICD-10-CM

## 2014-03-17 MED ORDER — CLINDAMYCIN HCL 150 MG PO CAPS: 450.0000 mg | ORAL_CAPSULE | Freq: Four times a day (QID) | ORAL | Status: DC

## 2014-03-17 NOTE — Progress Notes (Signed)
[name] is a 53 y.o. male who presents today for Hospital F/U for PNA.  Hospital F/U for PNA - Doing well, no fevers, improving breathing status.  Denies sputum production, decreased appetite, N/V/D.  Still with some fatigue, but improving per nursing staff.   Past Medical History  Diagnosis Date  . 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 CEREBRAL PALSY 07/24/2006  . HYPERLIPIDEMIA 07/02/2010  . GASTRIC ULCER ACUTE WITH HEMORRHAGE 07/24/2006  . 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 URINARY INCONTINENCE 02/09/2010    History  Smoking status  . Never Smoker   Smokeless tobacco  . Never Used    No family history on file.  Current Outpatient Prescriptions on File Prior to Visit  Medication Sig Dispense Refill  . baclofen (LIORESAL) 20 MG tablet Take 1 tablet (20 mg total) by mouth 4 (four) times daily.  120 each  5  . cephALEXin (KEFLEX) 500 MG capsule Take 1 capsule (500 mg total) by mouth 3 (three) times daily.  30 capsule  0  . diclofenac sodium (VOLTAREN) 1 % GEL Apply 2 g topically 4 (four) times daily.  3 Tube  2  . divalproex (DEPAKOTE SPRINKLE) 125 MG capsule Take 500 mg by mouth 2 (two) times daily.       . Elastic Bandages & Supports (JOBST ANTI-EM KNEE HIGH MED) MISC by Does not apply route.      . Hydrocortisone Acetate 1 % CREA Apply 1 application topically daily. To face, every PM.  30 g  2  . Incontinence Supplies (MALE URINAL) MISC 1 Bottle by Does not apply route.      Marland Kitchen. ketoconazole (NIZORAL) 2 % shampoo Apply 1 application topically 2 (two) times a week.      . lansoprazole (PREVACID SOLUTAB) 30 MG disintegrating tablet Take 1 tablet (30 mg total) by mouth daily.  90 tablet  3  . LORazepam (ATIVAN) 1 MG tablet Take 1 mg by mouth every 4 (four) hours as needed for anxiety (anxiety).       . Multiple Vitamin (MULTIVITAMIN) capsule Take 1 capsule by mouth daily.  30 capsule  11  . nystatin (MYCOSTATIN/NYSTOP) 100000 UNIT/GM POWD Apply to inguinal area between legs daily until rash  resolved.  1 Bottle  1  . risperiDONE (RISPERDAL) 2 MG tablet Take 2 mg by mouth at bedtime.      Marland Kitchen. tiZANidine (ZANAFLEX) 4 MG tablet Take 1 tablet (4 mg total) by mouth at bedtime.  30 tablet  5  . tolterodine (DETROL LA) 2 MG 24 hr capsule Take 1 capsule (2 mg total) by mouth daily.  90 capsule  3  . traMADol (ULTRAM) 50 MG tablet Take 1 tablet (50 mg total) by mouth every 6 (six) hours as needed. For knee or joint pain.  50 tablet  1  . tretinoin (RETIN-A) 0.025 % gel Apply 1 applicator topically at bedtime.  45 g  2  . white petrolatum (VASELINE) GEL Apply 1 application topically daily.  106 g  0  . [DISCONTINUED] tolterodine (DETROL LA) 2 MG 24 hr capsule Take 1 capsule (2 mg total) by mouth daily.  90 capsule  3   No current facility-administered medications on file prior to visit.    ROS: Per HPI.  All other systems reviewed and are negative.   Physical Exam Filed Vitals:   03/17/14 1441  BP: 105/59  Pulse: 75  Temp: 98.9 F (37.2 C)    Physical Examination: General appearance - alert, well appearing, and  in no distress Chest - clear to auscultation, no wheezes, rales or rhonchi, symmetric air entry Heart - normal rate and regular rhythm

## 2014-03-17 NOTE — Assessment & Plan Note (Signed)
Continue Clindamycin 450 mg QID to complete 7 day course. F/U PRN or if starts to have Sx again.

## 2014-03-18 ENCOUNTER — Other Ambulatory Visit: Payer: Self-pay | Admitting: *Deleted

## 2014-03-18 DIAGNOSIS — G894 Chronic pain syndrome: Secondary | ICD-10-CM

## 2014-03-18 MED ORDER — DICLOFENAC SODIUM 1 % TD GEL: 2.0000 g | Freq: Four times a day (QID) | TRANSDERMAL | Status: DC

## 2014-03-18 NOTE — Telephone Encounter (Signed)
recvd fax request for Voltaren 1% gel.  Called into phamacy

## 2014-03-19 LAB — CULTURE, BLOOD (ROUTINE X 2)
CULTURE: NO GROWTH
Culture: NO GROWTH

## 2014-04-12 ENCOUNTER — Ambulatory Visit (INDEPENDENT_AMBULATORY_CARE_PROVIDER_SITE_OTHER): Payer: Medicare Other

## 2014-04-12 VITALS — BP 127/81 | HR 70 | Resp 18

## 2014-04-12 DIAGNOSIS — S90121S Contusion of right lesser toe(s) without damage to nail, sequela: Secondary | ICD-10-CM

## 2014-04-12 DIAGNOSIS — G629 Polyneuropathy, unspecified: Secondary | ICD-10-CM

## 2014-04-12 DIAGNOSIS — L97501 Non-pressure chronic ulcer of other part of unspecified foot limited to breakdown of skin: Secondary | ICD-10-CM

## 2014-04-12 DIAGNOSIS — I739 Peripheral vascular disease, unspecified: Secondary | ICD-10-CM

## 2014-04-12 NOTE — Progress Notes (Signed)
   Subjective:    Patient ID: [name], male    DOB: November 02, 1960, 53 y.o.   MRN: [id number]  HPI I THINK THAT WE ARE DOING BETTER AND THERE IS STILL SOME DRAINING ON BOTH PLACES    Review of Systems No new findings or systemic changes noted    Objective:   Physical Exam Ulcers of left foot have resolved there is a small ulcer lateral fifth MTP area with eschar tissue present the ulcer of the dorsal right hallux and lateral fifth met base on the right foot still present with mild serous and bloody drainage no active infection noted purulence no increased temperature patient does have profound ischemic changes with vascular compromise and as well as neuropathy and angiopathy. Patient nonweightbearing wearing accommodative cushioned shoes and booties.       Assessment & Plan:  Assessment neuropathic ischemic ulceration bilateral feet left foot is resolving right foot continues have ulceration of the hallux and fifth met base will continue with Silvadene gauze dressing changes third cleansed with all cleanse Silvadene and dressing reapplied he for debridement at this time suggested 1 month follow-up for palliative care is needed in the future area contact us if there is any changes or exacerbations at any time    Harriet Masson DPM

## 2014-04-12 NOTE — Patient Instructions (Signed)
ANTIBACTERIAL SOAP INSTRUCTIONS  THE DAY AFTER PROCEDURE  Please follow the instructions your doctor has marked.   Shower as usual. Before getting out, place a drop of antibacterial liquid soap (Dial) on a wet, clean washcloth.  Gently wipe washcloth over affected area.  Afterward, rinse the area with warm water.  Blot the area dry with a soft cloth and cover with antibiotic ointment (neosporin, polysporin, bacitracin) and band aid or gauze and tape  Place 3-4 drops of antibacterial liquid soap in a quart of warm tap water.  Submerge foot into water for 20 minutes.  If bandage was applied after your procedure, leave on to allow for easy lift off, then remove and continue with soak for the remaining time.  Next, blot area dry with a soft cloth and cover with a bandage.  Apply other medications as directed by your doctor, such as cortisporin otic solution (eardrops) or neosporin antibiotic ointment  Wash the foot with soap and water dry apply Silvadene and gauze wrap as instructed avoid anything tight or constrictive.

## 2014-05-13 ENCOUNTER — Ambulatory Visit (INDEPENDENT_AMBULATORY_CARE_PROVIDER_SITE_OTHER): Payer: Medicare Other | Admitting: Podiatry

## 2014-05-13 VITALS — BP 134/83 | HR 72 | Temp 97.5°F | Resp 16

## 2014-05-13 DIAGNOSIS — L97519 Non-pressure chronic ulcer of other part of right foot with unspecified severity: Secondary | ICD-10-CM

## 2014-05-13 DIAGNOSIS — L03115 Cellulitis of right lower limb: Secondary | ICD-10-CM

## 2014-05-13 MED ORDER — CLINDAMYCIN HCL 300 MG PO CAPS: 300.0000 mg | ORAL_CAPSULE | Freq: Three times a day (TID) | ORAL | Status: DC

## 2014-05-13 NOTE — Patient Instructions (Addendum)
Apply iodosorb to the wound followed by a dry dressing. If there wound gets too dry, can continue with silvadene.  The area of redness was marked. If there is any extension of the redness, go directly to the ED or if you develop any fevers, chills, nausea, vomiting.  Monitor for any signs/symptoms of infection. Call the office immediately if any occur or go directly to the emergency room. Call with any questions/concerns.

## 2014-05-16 ENCOUNTER — Encounter: Payer: Self-pay | Admitting: Podiatry

## 2014-05-16 NOTE — Progress Notes (Addendum)
Patient ID: [name], male   DOB: 07-13-1960, 53 y.o.   MRN: [id number]  Subjective: 53 year old male presents the office today with a caregiver from his facility for complaints of infection the right first toe. Patient's caregiver states the area has been red for couple of weeks and has been progressive. His been clear drainage from the wound on the top of the right first MTPJ however denies any purulence. There is no streaking identified. Denies any recent fevers, chills, nausea, vomiting. No recent antibiotic usage. No other complaints at this time.  Objective: Awake, alert, NAD Decreased DP/PT pulses bilaterally, CRT less than 3 seconds Protective sensation decreased with Simms Weinstein monofilament,  decreased vibratory sensation, decreased Achilles tendon reflex. There is an ulceration on the dorsal aspect of the right first MTPJ measuring approximately 2 x 1 cm and the wound base is fibro-granular. There is no probe to bone, undermining, tunneling. There is erythema around the wound extending dorsally to the distal metatarsal and laterally to the third MTPJ. There is no areas of fluctuance or crepitus identified. No ascending cellulitis. No purulence is expressed from the wound and only small amount of serous drainage. There pre-ulcerative lesions to the right fifth metatarsal base, left dorsal first MTPJ and left lateral fifth metatarsal head. No swelling erythema or drainage identified at the site. No pain with calf compression, swelling, warmth, erythema  Assessment: 53 year old male with right dorsal first MTPJ wound with cellulitis; pre-ulcerative lesions  Plan: -Treatment options were discussed including alternatives, risks, complications. -At this time recommended Iodosorb over the wound due to the drainage followed by dry sterile dressing daily. If the area becomes dry can use Silvadene was very have a prescription for. I dispensed 1 tube of Iodosorb to the patient. Wound care  supplies were ordered to be sent to the patient's facility as well as home nursing. -Prescribed clindamycin. Continue to monitor for any clinical signs or symptoms of worsening infection. I marked the area of cellulitis in the foot and discussed with the patient/caregiver that if the erythema extends passive slowly her to go directly to the emergency room. -Will obtain vascular studies if wound does not heal in the appropriate amount of time, and likely refer to the wound center if needed.  -Follow-up in 1 week or sooner should any problems arise. In the meantime, call the office with any questions, concerns, change in symptoms.

## 2014-05-17 ENCOUNTER — Encounter: Payer: Medicare Other | Attending: Physical Medicine & Rehabilitation

## 2014-05-17 ENCOUNTER — Telehealth: Payer: Self-pay | Admitting: *Deleted

## 2014-05-17 ENCOUNTER — Ambulatory Visit (HOSPITAL_BASED_OUTPATIENT_CLINIC_OR_DEPARTMENT_OTHER): Payer: Medicare Other | Admitting: Physical Medicine & Rehabilitation

## 2014-05-17 ENCOUNTER — Encounter: Payer: Self-pay | Admitting: Physical Medicine & Rehabilitation

## 2014-05-17 VITALS — BP 138/58 | HR 78 | Resp 14

## 2014-05-17 DIAGNOSIS — G808 Other cerebral palsy: Secondary | ICD-10-CM

## 2014-05-17 DIAGNOSIS — M7552 Bursitis of left shoulder: Secondary | ICD-10-CM | POA: Diagnosis not present

## 2014-05-17 DIAGNOSIS — G809 Cerebral palsy, unspecified: Secondary | ICD-10-CM

## 2014-05-17 MED ORDER — TIZANIDINE HCL 4 MG PO TABS: 2.0000 mg | ORAL_TABLET | Freq: Three times a day (TID) | ORAL | Status: DC

## 2014-05-17 MED ORDER — TIZANIDINE HCL 2 MG PO TABS: 2.0000 mg | ORAL_TABLET | Freq: Three times a day (TID) | ORAL | Status: DC

## 2014-05-17 NOTE — Telephone Encounter (Signed)
I spoke with Mereditha dn explaiend we gave caretaker hard copy rx and Aurther Lofterry is to be taking 2 mg zanaflex tid.

## 2014-05-17 NOTE — Telephone Encounter (Signed)
Gonzella LexMeredith  Judge [facility] if pt Rx for Zanaflex calls for the pt to take 1/2 tab bid and a full tab at bedtime. Needs clarification before they fill Rx

## 2014-05-17 NOTE — Patient Instructions (Signed)
Joint Injection  Care After  Refer to this sheet in the next few days. These instructions provide you with information on caring for yourself after you have had a joint injection. Your caregiver also may give you more specific instructions. Your treatment has been planned according to current medical practices, but problems sometimes occur. Call your caregiver if you have any problems or questions after your procedure.  After any type of joint injection, it is not uncommon to experience:  · Soreness, swelling, or bruising around the injection site.  · Mild numbness, tingling, or weakness around the injection site caused by the numbing medicine used before or with the injection.  It also is possible to experience the following effects associated with the specific agent after injection:  · Iodine-based contrast agents:  ¨ Allergic reaction (itching, hives, widespread redness, and swelling beyond the injection site).  · Corticosteroids (These effects are rare.):  ¨ Allergic reaction.  ¨ Increased blood sugar levels (If you have diabetes and you notice that your blood sugar levels have increased, notify your caregiver).  ¨ Increased blood pressure levels.  ¨ Mood swings.  · Hyaluronic acid in the use of viscosupplementation.  ¨ Temporary heat or redness.  ¨ Temporary rash and itching.  ¨ Increased fluid accumulation in the injected joint.  These effects all should resolve within a day after your procedure.   HOME CARE INSTRUCTIONS  · Limit yourself to light activity the day of your procedure. Avoid lifting heavy objects, bending, stooping, or twisting.  · Take prescription or over-the-counter pain medication as directed by your caregiver.  · You may apply ice to your injection site to reduce pain and swelling the day of your procedure. Ice may be applied 03-04 times:  ¨ Put ice in a plastic bag.  ¨ Place a towel between your skin and the bag.  ¨ Leave the ice on for no longer than 15-20 minutes each time.  SEEK  IMMEDIATE MEDICAL CARE IF:   · Pain and swelling get worse rather than better or extend beyond the injection site.  · Numbness does not go away.  · Blood or fluid continues to leak from the injection site.  · You have chest pain.  · You have swelling of your face or tongue.  · You have trouble breathing or you become dizzy.  · You develop a fever, chills, or severe tenderness at the injection site that last longer than 1 day.  MAKE SURE YOU:  · Understand these instructions.  · Watch your condition.  · Get help right away if you are not doing well or if you get worse.  Document Released: 01/24/2011 Document Revised: 08/05/2011 Document Reviewed: 01/24/2011  ExitCare® Patient Information ©2015 [employer]. This information is not intended to replace advice given to you by your health care provider. Make sure you discuss any questions you have with your health care provider.

## 2014-05-17 NOTE — Progress Notes (Signed)
Subjective:    Patient ID: Carola Rhineerry L Yo, male    DOB: Dec 13, 1960, 53 y.o.   MRN: [id number]  HPI  Left shoulder pain more than right No further knee pain , no longer doing transfer only hoyer lif tof 2 person lift Per caregiver, spasms and tightness seemed to be a bit worse. Already on maximum doseBaclofen 20 g 4 times a day Takes Zanaflex 4 mg daily at bedtime. We discussed increasing Zanaflex to 2 mg 3 times a day Pain Inventory Average Pain 3 Pain Right Now 2 My pain is intermittent, dull and aching  In the last 24 hours, has pain interfered with the following? General activity 3 Relation with others 4 Enjoyment of life 4 What TIME of day is your pain at its worst? morning and night Sleep (in general) Good   Pain is worse with: unsure Pain improves with: medication Relief from Meds: 3  Mobility use a wheelchair  Function disabled: date disabled .  Neuro/Psych No problems in this area  Prior Studies Any changes since last visit?  no  Physicians involved in your care Any changes since last visit?  no   History reviewed. No pertinent family history. History   Social History  . Marital Status: Single    Spouse Name: N/A    Number of Children: N/A  . Years of Education: N/A   Social History Main Topics  . Smoking status: Never Smoker   . Smokeless tobacco: Never Used  . Alcohol Use: No  . Drug Use: No  . Sexual Activity: No   Other Topics Concern  . None   Social History Narrative   Past Surgical History  Procedure Laterality Date  . Stomach surgery      as an infant "couldn't keep food down"   Past Medical History  Diagnosis Date  . 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 CEREBRAL PALSY 07/24/2006  . HYPERLIPIDEMIA 07/02/2010  . GASTRIC ULCER ACUTE WITH HEMORRHAGE 07/24/2006  . 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 URINARY INCONTINENCE 02/09/2010   BP 138/58 mmHg  Pulse 78  Resp 14  SpO2 97%  Opioid Risk Score:   Fall Risk Score: Low Fall Risk (0-5  points)   Review of Systems  HENT: Negative.   Eyes: Negative.   Respiratory: Negative.   Cardiovascular: Negative.   Gastrointestinal: Negative.   Endocrine: Negative.   Genitourinary: Negative.   Musculoskeletal: Positive for myalgias.  Skin: Negative.   Allergic/Immunologic: Negative.   Neurological: Positive for weakness.  Hematological: Negative.   Psychiatric/Behavioral: Positive for dysphoric mood.       Objective:   Physical Exam  Right shoulder limited external rotation flexion and abduction. Increased flexor tone at the right elbow Ashworth 3 Increased flexor tone at the right wrist Ashworth 3 Left shoulder limited external rotation and flexion abduction Increased left elbow flexor Ashworth 1  Positive Hawkins sign left shoulder      Assessment & Plan:  1. 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 Tone control is adequate for ADLs. Patient is dependent care at wheelchair level.  2. Right shoulder pain adhesive capsulitis overall improved with injection as well as medications  3. Right knee pain appears to be in the quadriceps tendon region. X-ray show medial compartment mild osteoarthritis. Both knees. Overall better with diclofenac gel.  #4. Spasticity control is good with 4 times a day baclofen 20 mg as well as 4 mg Zanaflex 5. Left shoulder pain appears to be impingement syndrome by examination will inject today  Shoulder injection Left side Without ultrasound guidance  Indication:Left Shoulder pain not relieved by medication management and other conservative care.  Informed consent was obtained after describing risks and benefits of the procedure with the patient, this includes bleeding, bruising, infection and medication side effects. The patient wishes to proceed and has given written consent. Patient was placed in a seated position. The Left shoulder was marked and prepped with betadine in the subacromial area. A 25-gauge 1-1/2 inch needle was  inserted into the subacromial area. After negative draw back for blood, a solution containing 1 mL of 6 mg per ML betamethasone and 4 mL of 1% lidocaine was injected. A band aid was applied. The patient tolerated the procedure well. Post procedure instructions were given.

## 2014-05-24 ENCOUNTER — Ambulatory Visit (INDEPENDENT_AMBULATORY_CARE_PROVIDER_SITE_OTHER): Payer: Medicare Other

## 2014-05-24 ENCOUNTER — Telehealth: Payer: Self-pay | Admitting: *Deleted

## 2014-05-24 VITALS — BP 135/89 | HR 71 | Resp 18

## 2014-05-24 DIAGNOSIS — L97519 Non-pressure chronic ulcer of other part of right foot with unspecified severity: Secondary | ICD-10-CM

## 2014-05-24 DIAGNOSIS — I739 Peripheral vascular disease, unspecified: Secondary | ICD-10-CM

## 2014-05-24 DIAGNOSIS — L97501 Non-pressure chronic ulcer of other part of unspecified foot limited to breakdown of skin: Secondary | ICD-10-CM

## 2014-05-24 DIAGNOSIS — L03115 Cellulitis of right lower limb: Secondary | ICD-10-CM

## 2014-05-24 NOTE — Telephone Encounter (Signed)
Referral sent to [facility] for wound care changes per Dr. Ralene CorkSikora.  [facility]  [phone] dressing change and wound care 3 times a week right foot, if there's drainage apply Iodosorb Gel, if no drainage apply Silvadene.

## 2014-05-24 NOTE — Patient Instructions (Signed)
ANTIBACTERIAL SOAP INSTRUCTIONS  THE DAY AFTER PROCEDURE  Please follow the instructions your doctor has marked.   Shower as usual. Before getting out, place a drop of antibacterial liquid soap (Dial) on a wet, clean washcloth.  Gently wipe washcloth over affected area.  Afterward, rinse the area with warm water.  Blot the area dry with a soft cloth and cover with antibiotic ointment (neosporin, polysporin, bacitracin) and band aid or gauze and tape  Place 3-4 drops of antibacterial liquid soap in a quart of warm tap water.  Submerge foot into water for 20 minutes.  If bandage was applied after your procedure, leave on to allow for easy lift off, then remove and continue with soak for the remaining time.  Next, blot area dry with a soft cloth and cover with a bandage.  Apply other medications as directed by your doctor, such as cortisporin otic solution (eardrops) or neosporin antibiotic ointment  Cleanse the wound daily either with saline and warm water or warm soapy water apply Silvadene and gauze dressing at the wound is dry apply Iodosorb and gauze dressing if it's damp or moist wound. Maintain dressing changes daily as instructed  Also continue with clindamycin 150 mg 3 times a day as instructed continue and obtain refill and continue taking clindamycin as instructed

## 2014-05-24 NOTE — Progress Notes (Signed)
Subjective:    Patient ID: [name], male    DOB: 1961-02-19, 53 y.o.   MRN: [id number]  HPI CAREGIVER STATES THAT IT LOOKS BETTER AND THERE IS NO ODOR BUT DOES HAVE SOME DRAINING ON THE RIGHT BIG TOE    Review of Systems no new findings or systemic changes noted     Objective:   Physical Exam Neurovascular status is unchanged thready to nonpalpable pulses continues to have an ulcer about 1.5 cm diameter over the hallux IP joint dorsally with some mild bloody serous drainage noted no purulence no malodor there is proximal edema and erythema to the anterior forefoot consistent with localized cellulitis no ascending cell is no lymphangitis no purulence no malodor is noted no systemic fevers or chills. Patient been on clindamycin advised to continue with clindamycin daily as instructed also continue with Silvadene and gauze dressing changes or Iodosorb and gauze dressings today and Iodosorb and gauze dressings applied after cleansing with all cleansed. The wound goes down to subcutaneous tissue level tender and painful there is significant vascular compromise and ischemic changes to both lower extremities wearing foot duties to protect both feet at the current time patient is severe contractures of feet and toes with profound neuropathy issues and angiopathy.       Assessment & Plan:  Assessment masker compromise with ulcer dorsum of the right great toe secondary to laceration or contusion this wound has become a complication and nonhealing or delayed healing wound site continue with daily dressing changes instructed I reserving gauze dressing applied today continue with clindamycin as instructed recheck in 2 weeks for follow-up. Also will look into home nursing and verify arrangements for home nursing care to come out at least 3 times a week to assist wound care and dressing changes and supplies. Recheck in 2 weeks, however if there is any changes or exacerbations go immediately to the  emergency room. Patient is advised that he has a high risk for amputation of that toe.  Alvan Dame DPM

## 2014-05-25 ENCOUNTER — Telehealth: Payer: Self-pay | Admitting: *Deleted

## 2014-05-25 NOTE — Telephone Encounter (Signed)
[facility] called and stated that they couldn't come out and do the dressing on the patient due to they were understaffed and I spoke with Delydia and she stated that she would [facility]. Misty StanleyLisa

## 2014-06-07 ENCOUNTER — Ambulatory Visit (INDEPENDENT_AMBULATORY_CARE_PROVIDER_SITE_OTHER): Payer: Medicare Other

## 2014-06-07 VITALS — BP 121/70 | HR 83 | Resp 18

## 2014-06-07 DIAGNOSIS — I739 Peripheral vascular disease, unspecified: Secondary | ICD-10-CM

## 2014-06-07 DIAGNOSIS — L97519 Non-pressure chronic ulcer of other part of right foot with unspecified severity: Secondary | ICD-10-CM

## 2014-06-07 DIAGNOSIS — S90121S Contusion of right lesser toe(s) without damage to nail, sequela: Secondary | ICD-10-CM

## 2014-06-07 NOTE — Patient Instructions (Addendum)
ANTIBACTERIAL SOAP INSTRUCTIONS  THE DAY AFTER PROCEDURE  Please follow the instructions your doctor has marked.   Shower as usual. Before getting out, place a drop of antibacterial liquid soap (Dial) on a wet, clean washcloth.  Gently wipe washcloth over affected area.  Afterward, rinse the area with warm water.  Blot the area dry with a soft cloth and cover with antibiotic ointment (neosporin, polysporin, bacitracin) and band aid or gauze and tape  Place 3-4 drops of antibacterial liquid soap in a quart of warm tap water.  Submerge foot into water for 20 minutes.  If bandage was applied after your procedure, leave on to allow for easy lift off, then remove and continue with soak for the remaining time.  Next, blot area dry with a soft cloth and cover with a bandage.  Apply other medications as directed by your doctor, such as cortisporin otic solution (eardrops) or neosporin antibiotic ointment  Continue to cleanse the wound daily with anorexia soap and water apply Silvadene and gauze dressing daily or every other day as instructed. If the wound is excessively moist or draining use Iodosorb and gauze dressing

## 2014-06-07 NOTE — Progress Notes (Signed)
Subjective:    Patient ID: [name], male    DOB: [birth year], 54 y.o.   MRN: [id number]  HPI HOME HEALTH IS COMING OUT THREE TIMES A WEEK AND STATES THAT IT IS ON THE RIGHT PATH TO HEALING    Review of Systems no new findings or systemic changes noted     Objective:   Physical Exam Continues to be ischemic ulceration proxy 1.5 x 2.0 cm over the hallux IP joint dorsally on the right great toe. Mild granulomas and serous drainage noted dorsum of the toe and are localized cellulitis and erythema noted. No ascending psoas or lymphangitis no fever chills no systemic signs of infection. Nursing has been coming out 3 times a week and doing care as instructed she's caregiver present with him at today's visit is doing either Silvadene or Iodosorb and gauze dressing changes as needed daily. No changes in neurovascular status patient on amatory in a wheelchair again profound neuropathy and angiopathy does have loss of function of the toe there is significant necrosis of tissue at the hallux IP joint is a slow/nonhealing ulceration of the right great toe.       Assessment & Plan:  Assessment this time ulcer dorsal right great toe secondary to history of laceration or contusion the healing or delayed healing wound is having home nursing daily wound dressing changes 3 times a week ointment in 4 weeks for long-term follow-up continue with either Silvadene or reserving gauze dressing changes daily as instructed contact is any changes or exacerbations or systemic signs were to occur  Alvan Dame DPM

## 2014-06-08 ENCOUNTER — Telehealth: Payer: Self-pay | Admitting: *Deleted

## 2014-06-08 NOTE — Telephone Encounter (Signed)
[facility] pt's cellulitis has flared again and she instructed pt's caregiver to refill the Cleocin.  I told Elmarie Shileyiffany, her pt was seen 06/07/2014 and if he needed another appt, we would be able to get him in with another doctor this week.

## 2014-06-09 ENCOUNTER — Other Ambulatory Visit: Payer: Self-pay | Admitting: *Deleted

## 2014-06-09 MED ORDER — HYDROCORTISONE ACETATE 1 % EX CREA: 1.0000 "application " | TOPICAL_CREAM | Freq: Every day | CUTANEOUS | Status: DC

## 2014-06-09 NOTE — Telephone Encounter (Signed)
Very good refill on the Cleocin and schedule with somebody if needed to be seen this week next  Thanks Alvan Dameichard Aleda Madl DPM

## 2014-07-05 ENCOUNTER — Ambulatory Visit (INDEPENDENT_AMBULATORY_CARE_PROVIDER_SITE_OTHER): Payer: Medicare Other

## 2014-07-05 VITALS — BP 150/76 | HR 68 | Temp 99.2°F | Resp 14

## 2014-07-05 DIAGNOSIS — I739 Peripheral vascular disease, unspecified: Secondary | ICD-10-CM

## 2014-07-05 DIAGNOSIS — L89891 Pressure ulcer of other site, stage 1: Secondary | ICD-10-CM | POA: Diagnosis not present

## 2014-07-05 DIAGNOSIS — S90121S Contusion of right lesser toe(s) without damage to nail, sequela: Secondary | ICD-10-CM

## 2014-07-05 DIAGNOSIS — L97501 Non-pressure chronic ulcer of other part of unspecified foot limited to breakdown of skin: Secondary | ICD-10-CM

## 2014-07-05 DIAGNOSIS — L97519 Non-pressure chronic ulcer of other part of right foot with unspecified severity: Secondary | ICD-10-CM

## 2014-07-05 DIAGNOSIS — G629 Polyneuropathy, unspecified: Secondary | ICD-10-CM

## 2014-07-05 NOTE — Patient Instructions (Signed)
ANTIBACTERIAL SOAP INSTRUCTIONS  THE DAY AFTER PROCEDURE  Please follow the instructions your doctor has marked.   Shower as usual. Before getting out, place a drop of antibacterial liquid soap (Dial) on a wet, clean washcloth.  Gently wipe washcloth over affected area.  Afterward, rinse the area with warm water.  Blot the area dry with a soft cloth and cover with antibiotic ointment (neosporin, polysporin, bacitracin) and band aid or gauze and tape  Place 3-4 drops of antibacterial liquid soap in a quart of warm tap water.  Submerge foot into water for 20 minutes.  If bandage was applied after your procedure, leave on to allow for easy lift off, then remove and continue with soak for the remaining time.  Next, blot area dry with a soft cloth and cover with a bandage.  After cleansing and washing the wound daily apply Silvadene and gauze dressing as instructed continue daily dressing changes until ulcer has resolved are healed. Next  On days where there is a lot of moisture or drainage cane use Iodosorb rather than Silvadene for dressing changes, however use Iodosorb only 1 there is excessive drainage and moisture

## 2014-07-05 NOTE — Progress Notes (Signed)
Subjective:    Patient ID: [name], male    DOB: 1961/03/28, 54 y.o.   MRN: [id number]  HPI Comments: Pt's caregiver, Benn Moulder sttates the home health nurse and she have been taking care of the right 1st toe and has seen a decrease in the drainage.  Pt states the toe doesn't hurt as bad.     Review of Systems no new findings or systemic changes noted    Objective:   Physical Exam Vascular status is unchanged pedal pulses are thready to nonpalpable. There is still an ulcer about 1.5 cm in overall diameter at the IP joint of the hallux dorsally on the right great toe. Her is some necrotic tissue and eschar tissue which is debrided away there is new epithelialization starting to occur over the ulcer site mild serous drainage still present no ascending cellulitis or pharyngitis no malodor stomach signs of infection noted.       Assessment & Plan:  Slow healing ischemic ulcer of the right great toe secondary to injury or contusion. At this time continue with daily cleansing with antibacterial soap and water Silvadene gauze dressing which is reapplied at this time. Reappointed in one month or at least 4 weeks for long-term follow-up continue with wound care dressing changes daily next  Alvan Dame DPM

## 2014-07-07 ENCOUNTER — Ambulatory Visit (INDEPENDENT_AMBULATORY_CARE_PROVIDER_SITE_OTHER): Payer: Medicare Other | Admitting: Family Medicine

## 2014-07-07 ENCOUNTER — Encounter: Payer: Self-pay | Admitting: Family Medicine

## 2014-07-07 VITALS — BP 121/79 | HR 73 | Temp 97.9°F

## 2014-07-07 DIAGNOSIS — R739 Hyperglycemia, unspecified: Secondary | ICD-10-CM

## 2014-07-07 DIAGNOSIS — Z125 Encounter for screening for malignant neoplasm of prostate: Secondary | ICD-10-CM

## 2014-07-07 DIAGNOSIS — Z0000 Encounter for general adult medical examination without abnormal findings: Secondary | ICD-10-CM

## 2014-07-07 DIAGNOSIS — Z0189 Encounter for other specified special examinations: Secondary | ICD-10-CM

## 2014-07-07 DIAGNOSIS — E785 Hyperlipidemia, unspecified: Secondary | ICD-10-CM

## 2014-07-07 NOTE — Progress Notes (Signed)
Subjective:    Patient ID: [name], male    DOB: December 15, 1960, 54 y.o.   MRN: [id number]  HPI: Pt presents to clinic for his annual physical exam. PMH significant for wheelchair-bound state due to CP / spastic quadriplegia. His is accompanied by a caregiver from his group home who requests signatures on yearly documentation and discontinuation order for old meds he has completed. He has no specific complaints other than wanting his ears flushed due to earwax build-up. He has had several toe ulcers and has taken antibiotics recently, and is being followed closely by podiatry. He is due for colonoscopy and a PSA screening. Pt is a never smoker. He is not sexually active and does not drink alcohol.  No family history on file.  Past Medical History  Diagnosis Date  . 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 CEREBRAL PALSY 07/24/2006  . HYPERLIPIDEMIA 07/02/2010  . GASTRIC ULCER ACUTE WITH HEMORRHAGE 07/24/2006  . 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 URINARY INCONTINENCE 02/09/2010    Past Surgical History  Procedure Laterality Date  . Stomach surgery      as an infant "couldn't keep food down"    History   Social History  . Marital Status: Single    Spouse Name: N/A  . Number of Children: N/A  . Years of Education: N/A   Occupational History  . Not on file.   Social History Main Topics  . Smoking status: Never Smoker   . Smokeless tobacco: Never Used  . Alcohol Use: No  . Drug Use: No  . Sexual Activity: No   Other Topics Concern  . Not on file   Social History Narrative    In addition to the above documentation, pt's PMH, surgical history, FH, and SH all reviewed and updated where appropriate in the EMR. I have also reviewed and updated the pt's allergies and current medications as appropriate.  Review of Systems: As above. He is incontinent of urine but has no urinary complaints. Otherwise, full 12-system ROS was reviewed and all negative.     Objective:   Physical Exam BP  121/79 mmHg  Pulse 73  Temp(Src) 97.9 F (36.6 C) (Oral) Gen: adult male, wheel-chair bound, in no acute distress; answers questions appropriately, though does have some mental delay  HEENT: PERRLA, EOMI TM's clear bilaterally, MMM, no cervical lymphadenopathy small pieces of hard cerumen present in bilateral external ear canals, easily removed with flushing and ear curette Pulm: CTAB, no increased WOB, appears comfortable on room air  Cardio: RRR, no murmur appreciated  Abd: soft, nontender, BS+; exam limited by positioning in wheelchair MSK: markedly thin limbs bilaterally with contractures limiting range of motion throughout Ext/Neuro: non-focal exam, global weakness with some contractures, does move all extremities spontaneously  Bilateral feet with foot drop, scattered ulcerations at bony prominences with dressings in place, without frank bleeding or drainage Skin: no frank rashes noted     Assessment & Plan:  54yo male with CP / quadriplegia with chronic feet / toe issues, seeing podiatry - in general good / stable health, otherwise - hx of hyperglycemia in the past but no frank diagnosis of diabetes  Anticipatory guidance / Risk factor reduction - encouraged good f/u with PCP and podiatry, especially - praised abstinence from alcohol and tobacco use  Immunization / screening / ancillary studies  - lipid panel and PSA checked today - checking BMP for glucose and kidney function - encouraged f/u with GI for screening colonoscopy  F/u yearly for wellness  visits, or PRN otherwise  Bobbye Morton, MD PGY-3, [facility] 07/07/2014, 7:57 PM

## 2014-07-07 NOTE — Patient Instructions (Addendum)
Thank you for coming in, today!  [name] looks pretty good, today. I would recommend a screening colonoscopy. I would also recommend that he be seen by his podiatrist as instructed.  He can come back to see us in a year or sooner if need. My last day is going to be June 30th of this year, so he'll have another doctor here after that. I will call or send a letter with his lab results.  Please feel free to call with any questions or concerns at any time, at [phone]. --Dr. Casper HarrisonStreet

## 2014-07-08 ENCOUNTER — Encounter: Payer: Self-pay | Admitting: Family Medicine

## 2014-07-08 LAB — BASIC METABOLIC PANEL
BUN: 12 mg/dL (ref 6–23)
CHLORIDE: 102 meq/L (ref 96–112)
CO2: 24 mEq/L (ref 19–32)
Calcium: 9.9 mg/dL (ref 8.4–10.5)
Creat: 0.55 mg/dL (ref 0.50–1.35)
Glucose, Bld: 81 mg/dL (ref 70–99)
Potassium: 4.7 mEq/L (ref 3.5–5.3)
Sodium: 143 mEq/L (ref 135–145)

## 2014-07-08 LAB — LIPID PANEL
CHOLESTEROL: 182 mg/dL (ref 0–200)
HDL: 45 mg/dL (ref >39)
LDL Cholesterol: 104 mg/dL — ABNORMAL HIGH (ref 0–99)
Total CHOL/HDL Ratio: 4 Ratio
Triglycerides: 164 mg/dL — ABNORMAL HIGH (ref <150)
VLDL: 33 mg/dL (ref 0–40)

## 2014-07-08 LAB — PSA, MEDICARE: PSA: 0.98 ng/mL (ref <=4.00)

## 2014-08-02 ENCOUNTER — Ambulatory Visit (INDEPENDENT_AMBULATORY_CARE_PROVIDER_SITE_OTHER): Payer: Medicare Other

## 2014-08-02 VITALS — BP 158/94 | HR 82 | Resp 12

## 2014-08-02 DIAGNOSIS — L97501 Non-pressure chronic ulcer of other part of unspecified foot limited to breakdown of skin: Secondary | ICD-10-CM

## 2014-08-02 DIAGNOSIS — I739 Peripheral vascular disease, unspecified: Secondary | ICD-10-CM

## 2014-08-02 DIAGNOSIS — L89891 Pressure ulcer of other site, stage 1: Secondary | ICD-10-CM

## 2014-08-02 DIAGNOSIS — G629 Polyneuropathy, unspecified: Secondary | ICD-10-CM

## 2014-08-02 DIAGNOSIS — L97519 Non-pressure chronic ulcer of other part of right foot with unspecified severity: Secondary | ICD-10-CM

## 2014-08-02 DIAGNOSIS — S90121S Contusion of right lesser toe(s) without damage to nail, sequela: Secondary | ICD-10-CM

## 2014-08-02 NOTE — Progress Notes (Signed)
Subjective:    Patient ID: [name], male    DOB: 10-Nov-1960, 54 y.o.   MRN: [id number]  HPI  ''RT FOOT GREAT TOE IS STILL HAVE DRAINAGE AND BLEEDING.''  Review of Systemsno new findings or systemic changes noted patient is in a chair nonambulatory no complaint of pain at this time     Objective:   Physical Exam Lower extremity objective unchanged pedal pulses nonpalpable DP or PT right foot patient does have a 1.5 x 2 cm ulceration over the dorsal IP joint of the right great toe there is serous bloody drainage there is some pink granular base and some fibrous necrotic tissue does go down to subcutaneous tissue and capsule joint does not go down to bone there is localized erythema and eschar tissue present there is also slight excoriation and erythema over the dorsum of the forefoot osteotomy some irritation from managing her dressing. No ascending cellulitis or lymphangitis patient is afebrile continue to have home nursing and nursing care to assist with dressing changes at least 3 times a week.       Assessment & Plan:  Assessment ischemic ulcer secondary contusion or injury dorsum of the right great toe. Continue with cleansing and a bacterial SOAP and water maintain Silvadene and gauze dressing appears to be clean and stable no secondary infection at this time however as noted patient is very poor bed vascular supply and very poor capacity for healing will continue monitor dressing changes daily or at least every other day with nursing and home nursing assistance. Recheck in proxy 4 weeks for further follow-up and reevaluation contact us immediately if there is any changes or exacerbation of symptoms are worsening of the ulcer.  Alvan Dame DPM

## 2014-08-30 ENCOUNTER — Ambulatory Visit: Payer: Medicare Other

## 2014-08-31 ENCOUNTER — Encounter: Payer: Self-pay | Admitting: Podiatry

## 2014-08-31 ENCOUNTER — Ambulatory Visit (INDEPENDENT_AMBULATORY_CARE_PROVIDER_SITE_OTHER): Payer: Medicare Other | Admitting: Podiatry

## 2014-08-31 VITALS — BP 133/75 | HR 82 | Temp 97.6°F | Resp 16

## 2014-08-31 DIAGNOSIS — L97519 Non-pressure chronic ulcer of other part of right foot with unspecified severity: Secondary | ICD-10-CM

## 2014-08-31 DIAGNOSIS — L89891 Pressure ulcer of other site, stage 1: Secondary | ICD-10-CM | POA: Diagnosis not present

## 2014-08-31 DIAGNOSIS — L03115 Cellulitis of right lower limb: Secondary | ICD-10-CM

## 2014-08-31 MED ORDER — CLINDAMYCIN HCL 300 MG PO CAPS: 300.0000 mg | ORAL_CAPSULE | Freq: Three times a day (TID) | ORAL | Status: DC

## 2014-08-31 NOTE — Patient Instructions (Signed)
Take one clindamycin 300 mg by mouth 3 times a day 10 days Soak foot ulcer daily and apply Silvadene cream and cover with gauze  Betadine Soak Instructions  Purchase an 8 oz. bottle of BETADINE solution (Povidone)  THE DAY AFTER THE PROCEDURE  Place 1 tablespoon of betadine solution in a quart of warm tap water.  Submerge your foot or feet with outer bandage intact for the initial soak; this will allow the bandage to become moist and wet for easy lift off.  Once you remove your bandage, continue to soak in the solution for 20 minutes.  This soak should be done twice a day.  Next, remove your foot or feet from solution, blot dry the affected area and cover.  You may use a band aid large enough to cover the area or use gauze and tape.  Apply other medications to the area as directed by the doctor such as cortisporin otic solution (ear drops) or neosporin.  IF YOUR SKIN BECOMES IRRITATED WHILE USING THESE INSTRUCTIONS, IT IS OKAY TO SWITCH TO EPSOM SALTS AND WATER OR WHITE VINEGAR AND WATER.

## 2014-09-01 NOTE — Progress Notes (Signed)
Patient ID: [name], male   DOB: 15-Jun-1960, 54 y.o.   MRN: [id number]  Subjective: This patient presents for follow-up care for an ulceration on the right hallux with a history of injury and contusion. On the visit of 07/05/2014 Dr. Ralene CorkSikora prescribed Silvadene dressings with a month follow-up. On the visit of March 8 Dr. scar recommended continue antibacterial soaps and Silvadene dressings with a recheck in 4 weeks. In the last several days the right hallux is increase in drainage and bleeding according to his caretaker caregiver Benn Moulderrenee who is present in the room today. According to patient's caregiver he has a history of recurrent infections that have responded to clindamycin.  Objective: Patient in wheelchair Dorsal aspect of the right looks has a 20 x 25 mm superficial ulceration with a granular base with local erythema and edema.. There is no warmth in the right foot or erythema extending from the wound site  Assessment: Ulceration cellulitis right hallux  Plan: Rx clindamycin 300 mg by mouth 3 times a day 10 days Recommend diluted Betadine soaks daily Maintain Silvadene dressings to wound site daily  Reappoint 2 weeks

## 2014-09-12 ENCOUNTER — Ambulatory Visit (INDEPENDENT_AMBULATORY_CARE_PROVIDER_SITE_OTHER): Payer: Medicare Other | Admitting: Podiatry

## 2014-09-12 ENCOUNTER — Encounter: Payer: Self-pay | Admitting: Podiatry

## 2014-09-12 VITALS — BP 115/72 | HR 73 | Resp 14

## 2014-09-12 DIAGNOSIS — L03115 Cellulitis of right lower limb: Secondary | ICD-10-CM | POA: Diagnosis not present

## 2014-09-12 DIAGNOSIS — L97519 Non-pressure chronic ulcer of other part of right foot with unspecified severity: Secondary | ICD-10-CM | POA: Diagnosis not present

## 2014-09-12 MED ORDER — CIPROFLOXACIN HCL 500 MG PO TABS: 500.0000 mg | ORAL_TABLET | Freq: Two times a day (BID) | ORAL | Status: DC

## 2014-09-12 MED ORDER — CLINDAMYCIN HCL 300 MG PO CAPS: 300.0000 mg | ORAL_CAPSULE | Freq: Three times a day (TID) | ORAL | Status: DC

## 2014-09-12 NOTE — Patient Instructions (Signed)
Continue on clindamycin 300 mg 1 capsule 3 times a day 10 days Add-on Cipro 500 mg 1 by mouth twice a day 10 days Continue application of Silvadene to skin ulcer right If there is sudden increase in pain, swelling, redness present to ER

## 2014-09-13 ENCOUNTER — Telehealth: Payer: Self-pay | Admitting: *Deleted

## 2014-09-13 NOTE — Telephone Encounter (Signed)
Tilman Neatrenee Milton called from pt's care facility stating an order was needed to stop Dr. Gabriel RungWagoner's order of 05/13/2014 for Clindamycin, which in the care facility's order set is presented as a continuous or running order.  Dr. Theotis Burrowuchman's 09/12/2014 order needed to be written as Clindamycin 300mg  #30 one capsule tid for 10 days with one refill as needed.  Handwritten orders were faxed to [phone] attn:  Tilman Neatrenee Milton.

## 2014-09-13 NOTE — Progress Notes (Signed)
Patient ID: [name], male   DOB: [birth year], 54 y.o.   MRN: [id number]  Subjective: This patient has a history of recurrent ulceration cellulitis in the past have responded to clindamycin. This patient presents for follow-up care for ulceration cellulitis right hallux. On the visit of 08/31/2014 clindamycin 300 mg 3 times a day 10 days was prescribed with diluted Betadine soaks and Silvadene dressings. Patient has completed clindamycin  Objective: Patient seated in a wheelchair with Trenee caregiver present in the room The right hallux has superficial ulcer measuring 25 x 20 mm with a granular base on the dorsal ask the right foot.  erythema  extends proximally from the ulcer site 6 cm. There is a low-grade edema in the area without any active drainage or malodor  Assessment: Ulceration cellulitis right foot  Plan  Refill clindamycin 300 mg by mouth 3 times a day 10 days Add-on Cipro 500 mg by mouth twice a day 10 days Maintain Silvadene cream to ulcer daily If there is any sudden increase in pain, swelling, redness present to ER  Reappoint 2 weeks

## 2014-09-28 ENCOUNTER — Encounter: Payer: Self-pay | Admitting: Podiatry

## 2014-09-28 ENCOUNTER — Ambulatory Visit (INDEPENDENT_AMBULATORY_CARE_PROVIDER_SITE_OTHER): Payer: Medicare Other | Admitting: Podiatry

## 2014-09-28 VITALS — BP 120/83 | HR 76 | Temp 97.2°F | Resp 12

## 2014-09-28 DIAGNOSIS — L89891 Pressure ulcer of other site, stage 1: Secondary | ICD-10-CM | POA: Diagnosis not present

## 2014-09-28 DIAGNOSIS — L97519 Non-pressure chronic ulcer of other part of right foot with unspecified severity: Secondary | ICD-10-CM

## 2014-09-28 NOTE — Patient Instructions (Signed)
Continued diluted Betadine soaks daily or every other day to the ulcer on the right great toe Continue to apply Silvadene cream daily or every other day to the right great toe and cover with gauze Completed any remaining antibiotics and do not refill

## 2014-09-28 NOTE — Progress Notes (Signed)
   Subjective:    Patient ID: [name] L Bui, male    DOB: 1961-02-19, 54 y.o.   MRN: [id number]  HPI  ''RT FOOT GREAT TOE STILL HAVE AN OPEN WOUND AND DRAINAGE.''  This patient presents for follow-up care for an ulceration on the right hallux with a history of injury and contusion. On the visit of 07/05/2014 Dr. Ralene CorkSikora prescribed Silvadene dressings with a month follow-up. On the visit of March 8 Dr. scar recommended continue antibacterial soaps and Silvadene dressings with a recheck in 4 weeks. In the last several days the right hallux is increase in drainage and bleeding according to his caretaker caregiver Benn Moulderrenee who is present in the room today. According to patient's caregiver he has a history of recurrent infections that have responded to clindamycin. This patient has completed 20 days of clindamycin 300 mg by mouth 3 times a day as well as Cipro 500 mg by mouth twice a day 10 days. His care giver denies any adverse reactions from antibiotics including diarrhea, nausea.        Review of Systems  Skin: Positive for color change.       Objective:   Physical Exam  Patient seated in a wheelchair with caregiver present in room  Dorsal right hallux has a ulceration measuring 20 x 10 mm with a moist granular base. There is no surrounding erythema, edema, malodor or warmth noted in the ulcer site      Assessment & Plan:   Assessment: Resolved cellulitis right hallux Noninfected dermal ulceration right hallux  Plan: Continue to rinse diabetic foot ulcer daily in diluted Betadine soaks and apply Silvadene cream and cover with gauze. This daily wound care will be performed by patient's caregiver  Reappoint 2 weeks

## 2014-10-12 ENCOUNTER — Encounter: Payer: Self-pay | Admitting: Podiatry

## 2014-10-12 ENCOUNTER — Ambulatory Visit (INDEPENDENT_AMBULATORY_CARE_PROVIDER_SITE_OTHER): Payer: Medicare Other | Admitting: Podiatry

## 2014-10-12 VITALS — BP 125/74 | HR 75 | Temp 99.1°F | Resp 16

## 2014-10-12 DIAGNOSIS — L89891 Pressure ulcer of other site, stage 1: Secondary | ICD-10-CM | POA: Diagnosis not present

## 2014-10-12 DIAGNOSIS — L97519 Non-pressure chronic ulcer of other part of right foot with unspecified severity: Secondary | ICD-10-CM

## 2014-10-12 NOTE — Progress Notes (Signed)
Patient ID: [name], male   DOB: Oct 20, 1960, 54 y.o.   MRN: [id number]  Subjective: This patient presents for follow-up care for ulceration right hallux with a history of injury and contusion. On the visit of 07/05/2014 Dr. Ralene CorkSikora prescribed Silvadene dressings with the month follow-up on the visit of 08/02/2014, Dr. Ralene CorkSikora recommended contact January antibacterial soft soaks and Silvadene dressings with a recheck in 4 weeks. Patient has been under care for cellulitis and ulceration right hallux treated with clindamycin 300 mg by mouth 3 times a day as well as Cipro 500 mg by mouth twice a day. He is completed all antibiotics.  Objective: Patient is seated in a wheelchair his caregiver present in the treatment room The dorsal right hallux has a crusted ulcer with a granular base 10 mm in diameter. There is no surrounding erythema, edema, warmth.  Assessment: Noninfected skin ulceration right hallux  Plan: Debridement of crusted area around ulcer Maintain Silvadene dressings daily

## 2014-10-12 NOTE — Patient Instructions (Signed)
Apply Silvadene cream to skin ulcer right big toe daily and cover with gauze

## 2014-10-14 ENCOUNTER — Ambulatory Visit (INDEPENDENT_AMBULATORY_CARE_PROVIDER_SITE_OTHER): Payer: Medicare Other | Admitting: Family Medicine

## 2014-10-14 ENCOUNTER — Encounter: Payer: Self-pay | Admitting: Family Medicine

## 2014-10-14 ENCOUNTER — Ambulatory Visit
Admission: RE | Admit: 2014-10-14 | Discharge: 2014-10-14 | Disposition: A | Discharge status: Home / Self Care | Payer: Medicare Other | Source: Ambulatory Visit | Attending: Family Medicine | Admitting: Family Medicine

## 2014-10-14 VITALS — BP 112/64 | HR 84 | Temp 98.2°F

## 2014-10-14 DIAGNOSIS — R05 Cough: Secondary | ICD-10-CM

## 2014-10-14 DIAGNOSIS — R059 Cough, unspecified: Secondary | ICD-10-CM

## 2014-10-14 MED ORDER — LEVOFLOXACIN 750 MG PO TABS: 750.0000 mg | ORAL_TABLET | Freq: Every day | ORAL | Status: DC

## 2014-10-14 NOTE — Assessment & Plan Note (Addendum)
Cough, chills and temps to 99.5, diarrhea, decreased PO. Diminished breath sounds on left side. - concern for pneumonia, CAP vs aspiration - CXR - start levaquin, if CXR negative stop tomorrow when I call with results - f/u for fevers >101, or failing to improve

## 2014-10-14 NOTE — Patient Instructions (Signed)

## 2014-10-14 NOTE — Progress Notes (Signed)
   Subjective:    Patient ID: [name], male    DOB: March 14, 1961, 54 y.o.   MRN: [id number]  HPI Pt presents for cough, fever, decreased PO, and diarrhea. Caregiver reports that he has been coughing for a few weeks but they initially assumed it was just allergies. It is nonproductive. For the past 3 days he has also had diarrhea and chills with temps to 99.5. He is continuing to have good UOP and taking fluids but reports he is just not feeling hungry lately, which is quite abnormal for him per caregiver.    Review of Systems See HPI    Objective:   Physical Exam  Constitutional: He is oriented to person, place, and time. He appears well-developed and well-nourished. No distress.  Eyes: Conjunctivae are normal. Right eye exhibits no discharge. Left eye exhibits no discharge. No scleral icterus.  Cardiovascular: Normal rate, regular rhythm and normal heart sounds.   No murmur heard. Pulmonary/Chest: Effort normal. No respiratory distress. He has no wheezes.  Decreased breath sounds throughout left side  Musculoskeletal:       Left foot: There is swelling. There is no tenderness and no bony tenderness.       Feet:  Neurological: He is alert and oriented to person, place, and time.  Skin: Skin is warm and dry. He is not diaphoretic.  Nursing note and vitals reviewed.         Assessment & Plan:

## 2014-10-15 ENCOUNTER — Encounter (HOSPITAL_COMMUNITY): Payer: Self-pay | Admitting: Emergency Medicine

## 2014-10-15 ENCOUNTER — Emergency Department (HOSPITAL_COMMUNITY): Payer: Medicare Other

## 2014-10-15 ENCOUNTER — Inpatient Hospital Stay (HOSPITAL_COMMUNITY)
Admission: EM | Admit: 2014-10-15 | Discharge: 2014-10-20 | DRG: 385 | Disposition: A | Discharge status: Home / Self Care | Payer: Medicare Other | Attending: Family Medicine | Admitting: Family Medicine

## 2014-10-15 DIAGNOSIS — R32 Unspecified urinary incontinence: Secondary | ICD-10-CM | POA: Diagnosis present

## 2014-10-15 DIAGNOSIS — K5900 Constipation, unspecified: Secondary | ICD-10-CM | POA: Diagnosis present

## 2014-10-15 DIAGNOSIS — K5190 Ulcerative colitis, unspecified, without complications: Secondary | ICD-10-CM

## 2014-10-15 DIAGNOSIS — L89899 Pressure ulcer of other site, unspecified stage: Secondary | ICD-10-CM | POA: Diagnosis present

## 2014-10-15 DIAGNOSIS — R109 Unspecified abdominal pain: Secondary | ICD-10-CM | POA: Insufficient documentation

## 2014-10-15 DIAGNOSIS — M25512 Pain in left shoulder: Secondary | ICD-10-CM | POA: Diagnosis not present

## 2014-10-15 DIAGNOSIS — K5100 Ulcerative (chronic) pancolitis without complications: Secondary | ICD-10-CM | POA: Diagnosis present

## 2014-10-15 DIAGNOSIS — R079 Chest pain, unspecified: Secondary | ICD-10-CM | POA: Diagnosis not present

## 2014-10-15 DIAGNOSIS — G808 Other cerebral palsy: Secondary | ICD-10-CM | POA: Diagnosis present

## 2014-10-15 DIAGNOSIS — F79 Unspecified intellectual disabilities: Secondary | ICD-10-CM

## 2014-10-15 DIAGNOSIS — M79674 Pain in right toe(s): Secondary | ICD-10-CM | POA: Insufficient documentation

## 2014-10-15 DIAGNOSIS — K529 Noninfective gastroenteritis and colitis, unspecified: Secondary | ICD-10-CM | POA: Diagnosis present

## 2014-10-15 DIAGNOSIS — I4581 Long QT syndrome: Secondary | ICD-10-CM | POA: Diagnosis present

## 2014-10-15 DIAGNOSIS — Z91048 Other nonmedicinal substance allergy status: Secondary | ICD-10-CM

## 2014-10-15 DIAGNOSIS — Z8711 Personal history of peptic ulcer disease: Secondary | ICD-10-CM

## 2014-10-15 DIAGNOSIS — R140 Abdominal distension (gaseous): Secondary | ICD-10-CM

## 2014-10-15 DIAGNOSIS — M8660 Other chronic osteomyelitis, unspecified site: Secondary | ICD-10-CM | POA: Insufficient documentation

## 2014-10-15 DIAGNOSIS — J189 Pneumonia, unspecified organism: Secondary | ICD-10-CM | POA: Diagnosis present

## 2014-10-15 DIAGNOSIS — R059 Cough, unspecified: Secondary | ICD-10-CM

## 2014-10-15 DIAGNOSIS — R05 Cough: Secondary | ICD-10-CM

## 2014-10-15 DIAGNOSIS — K8020 Calculus of gallbladder without cholecystitis without obstruction: Secondary | ICD-10-CM | POA: Diagnosis present

## 2014-10-15 DIAGNOSIS — E785 Hyperlipidemia, unspecified: Secondary | ICD-10-CM | POA: Diagnosis present

## 2014-10-15 DIAGNOSIS — D509 Iron deficiency anemia, unspecified: Secondary | ICD-10-CM | POA: Diagnosis present

## 2014-10-15 DIAGNOSIS — K449 Diaphragmatic hernia without obstruction or gangrene: Secondary | ICD-10-CM | POA: Diagnosis present

## 2014-10-15 DIAGNOSIS — M868X7 Other osteomyelitis, ankle and foot: Secondary | ICD-10-CM | POA: Diagnosis present

## 2014-10-15 HISTORY — DX: Personal history of other diseases of the digestive system: Z87.19

## 2014-10-15 LAB — URINALYSIS, ROUTINE W REFLEX MICROSCOPIC
Glucose, UA: NEGATIVE mg/dL
Hgb urine dipstick: NEGATIVE
Ketones, ur: NEGATIVE mg/dL
Leukocytes, UA: NEGATIVE
Nitrite: NEGATIVE
Protein, ur: NEGATIVE mg/dL
SPECIFIC GRAVITY, URINE: 1.029 (ref 1.005–1.030)
Urobilinogen, UA: 1 mg/dL (ref 0.0–1.0)
pH: 6.5 (ref 5.0–8.0)

## 2014-10-15 LAB — I-STAT TROPONIN, ED: TROPONIN I, POC: 0 ng/mL (ref 0.00–0.08)

## 2014-10-15 LAB — CBC WITH DIFFERENTIAL/PLATELET
BASOS ABS: 0 10*3/uL (ref 0.0–0.1)
Basophils Relative: 0 % (ref 0–1)
Eosinophils Absolute: 0.5 10*3/uL (ref 0.0–0.7)
Eosinophils Relative: 4 % (ref 0–5)
HCT: 36.9 % — ABNORMAL LOW (ref 39.0–52.0)
Hemoglobin: 12 g/dL — ABNORMAL LOW (ref 13.0–17.0)
LYMPHS ABS: 1.7 10*3/uL (ref 0.7–4.0)
LYMPHS PCT: 15 % (ref 12–46)
MCH: 26.1 pg (ref 26.0–34.0)
MCHC: 32.5 g/dL (ref 30.0–36.0)
MCV: 80.2 fL (ref 78.0–100.0)
MONO ABS: 1.3 10*3/uL — AB (ref 0.1–1.0)
MONOS PCT: 11 % (ref 3–12)
Neutro Abs: 7.8 10*3/uL — ABNORMAL HIGH (ref 1.7–7.7)
Neutrophils Relative %: 70 % (ref 43–77)
Platelets: 230 10*3/uL (ref 150–400)
RBC: 4.6 MIL/uL (ref 4.22–5.81)
RDW: 15.4 % (ref 11.5–15.5)
WBC: 11.3 10*3/uL — ABNORMAL HIGH (ref 4.0–10.5)

## 2014-10-15 LAB — COMPREHENSIVE METABOLIC PANEL
ALK PHOS: 148 U/L — AB (ref 38–126)
ALT: 28 U/L (ref 17–63)
AST: 25 U/L (ref 15–41)
Albumin: 2.8 g/dL — ABNORMAL LOW (ref 3.5–5.0)
Anion gap: 10 (ref 5–15)
BUN: 12 mg/dL (ref 6–20)
CALCIUM: 8.7 mg/dL — AB (ref 8.9–10.3)
CO2: 24 mmol/L (ref 22–32)
Chloride: 101 mmol/L (ref 101–111)
Creatinine, Ser: 0.59 mg/dL — ABNORMAL LOW (ref 0.61–1.24)
GFR calc Af Amer: >60 mL/min (ref >60)
GLUCOSE: 139 mg/dL — AB (ref 65–99)
Potassium: 3.9 mmol/L (ref 3.5–5.1)
SODIUM: 135 mmol/L (ref 135–145)
TOTAL PROTEIN: 6.3 g/dL — AB (ref 6.5–8.1)
Total Bilirubin: 0.3 mg/dL (ref 0.3–1.2)

## 2014-10-15 LAB — LIPASE, BLOOD: LIPASE: 13 U/L — AB (ref 22–51)

## 2014-10-15 MED ORDER — METRONIDAZOLE 500 MG PO TABS
500.0000 mg | ORAL_TABLET | ORAL | Status: AC
  Administered 2014-10-15: 500 mg via ORAL
  Filled 2014-10-15: qty 1

## 2014-10-15 MED ORDER — ONDANSETRON HCL 4 MG/2ML IJ SOLN
4.0000 mg | Freq: Once | INTRAMUSCULAR | Status: AC
  Administered 2014-10-15: 4 mg via INTRAVENOUS
  Filled 2014-10-15: qty 2

## 2014-10-15 MED ORDER — IOHEXOL 300 MG/ML  SOLN
100.0000 mL | Freq: Once | INTRAMUSCULAR | Status: AC | PRN
  Administered 2014-10-15: 100 mL via INTRAVENOUS

## 2014-10-15 MED ORDER — FENTANYL CITRATE (PF) 100 MCG/2ML IJ SOLN
50.0000 ug | Freq: Once | INTRAMUSCULAR | Status: AC
  Administered 2014-10-15: 50 ug via INTRAVENOUS
  Filled 2014-10-15: qty 2

## 2014-10-15 MED ORDER — CIPROFLOXACIN IN D5W 400 MG/200ML IV SOLN
400.0000 mg | Freq: Once | INTRAVENOUS | Status: AC
  Administered 2014-10-15: 400 mg via INTRAVENOUS
  Filled 2014-10-15: qty 200

## 2014-10-15 NOTE — ED Notes (Signed)
Pt returned from CT °

## 2014-10-15 NOTE — ED Provider Notes (Addendum)
CSN: [id number]     Arrival date & time 10/15/14  1808 History   First MD Initiated Contact with Patient 10/15/14 1810     Chief Complaint  Patient presents with  . Shortness of Breath  . Abdominal Pain     (Consider location/radiation/quality/duration/timing/severity/associated sxs/prior Treatment) HPI Comments: 54 year old male with a history of cerebral palsy, hyperlipidemia, history of stomach ulcer which has been bleeding in the past approximately 2008, recently diagnosed with pneumonia clinically and started on Levaquin, presents today after having acute onset of abdominal pain, both right and left upper quadrants have been intermittently experiencing this pain, there is been no vomiting but he did have watery diarrhea earlier today. Symptoms are intermittent, colicky, nothing makes this better or worse, not associated with fevers. He continues to have a increased respiratory rate and occasional cough. He states that before the abdominal pain started he was having back pain on the left coming around the left side of the ribs and into the abdomen, states that he has had this many times in the past with worsening muscular spasms  Patient is a 54 y.o. male presenting with shortness of breath and abdominal pain. The history is provided by the patient and the EMS personnel.  Shortness of Breath Associated symptoms: abdominal pain   Abdominal Pain Associated symptoms: shortness of breath     Past Medical History  Diagnosis Date  . 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 CEREBRAL PALSY 07/24/2006  . HYPERLIPIDEMIA 07/02/2010  . GASTRIC ULCER ACUTE WITH HEMORRHAGE 07/24/2006  . 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 URINARY INCONTINENCE 02/09/2010   Past Surgical History  Procedure Laterality Date  . Stomach surgery      as an infant "couldn't keep food down"   History reviewed. No pertinent family history. History  Substance Use Topics  . Smoking status: Never Smoker   . Smokeless tobacco: Never Used  .  Alcohol Use: No    Review of Systems  Respiratory: Positive for shortness of breath.   Gastrointestinal: Positive for abdominal pain.  All other systems reviewed and are negative.     Allergies  Adhesive  Home Medications   Prior to Admission medications   Medication Sig Start Date End Date Taking? Authorizing Provider  baclofen (LIORESAL) 20 MG tablet Take 1 tablet (20 mg total) by mouth 4 (four) times daily. 12/17/13  Yes [name], MD  diclofenac sodium (VOLTAREN) 1 % GEL Apply 2 g topically 4 (four) times daily. 03/18/14  Yes [name], MD  divalproex (DEPAKOTE SPRINKLE) 125 MG capsule Take 500 mg by mouth 2 (two) times daily.    Yes Historical Provider, MD  Elastic Bandages & Supports (JOBST ANTI-EM KNEE HIGH MED) MISC by Does not apply route. Apply once in the morning and remove at bedtime daily   Yes Historical Provider, MD  Hydrocortisone Acetate 1 % CREA Apply 1 application topically daily. To face, every PM. 06/09/14  Yes Stephanie Coup Street, MD  ketoconazole (NIZORAL) 2 % cream Apply 1 application topically daily. Apply to face, skin folds, groin, underarms once daily 03/31/14  Yes Historical Provider, MD  ketoconazole (NIZORAL) 2 % shampoo Apply 1 application topically 2 (two) times a week. Monday and Friday   Yes Historical Provider, MD  lansoprazole (PREVACID SOLUTAB) 30 MG disintegrating tablet Take 1 tablet (30 mg total) by mouth daily. 07/31/10  Yes Nestor Ramp, MD  levofloxacin (LEVAQUIN) 750 MG tablet Take 1 tablet (750 mg total) by mouth daily. 10/14/14  Yes Michelle Nasuti  Prudencio Burly, MD  LORazepam (ATIVAN) 1 MG tablet Take 1 mg by mouth every 4 (four) hours as needed for anxiety (anxiety).    Yes Historical Provider, MD  Multiple Vitamin (MULTIVITAMIN) capsule Take 1 capsule by mouth daily. 07/31/10 10/15/14 Yes Nestor Ramp, MD  risperiDONE (RISPERDAL) 2 MG tablet Take 2 mg by mouth at bedtime.   Yes Historical Provider, MD  silver sulfADIAZINE (SILVADENE) 1 % cream  Apply 1 application topically daily.  03/31/14  Yes Historical Provider, MD  tiZANidine (ZANAFLEX) 4 MG tablet Take 2 mg by mouth 3 (three) times daily.  05/20/14  Yes Historical Provider, MD  traMADol (ULTRAM) 50 MG tablet Take 1 tablet (50 mg total) by mouth every 6 (six) hours as needed. For knee or joint pain. 11/23/13  Yes Stephanie Coup Street, MD  tretinoin (RETIN-A) 0.025 % gel Apply 1 applicator topically at bedtime. 04/09/13  Yes Stephanie Coup Street, MD  tiZANidine (ZANAFLEX) 2 MG tablet Take 1 tablet (2 mg total) by mouth 3 (three) times daily. Patient not taking: Reported on 10/15/2014 05/17/14   [name], MD  tolterodine (DETROL LA) 2 MG 24 hr capsule Take 1 capsule (2 mg total) by mouth daily. Patient not taking: Reported on 10/15/2014 04/08/13   Stephanie Coup Street, MD  white petrolatum (VASELINE) GEL Apply 1 application topically daily. Patient not taking: Reported on 10/15/2014 03/14/14   Leona Singleton, MD   BP 141/72 mmHg  Pulse 93  Temp(Src) 97.6 F (36.4 C) (Oral)  Resp 18  SpO2 90% Physical Exam  Constitutional: He appears well-developed and well-nourished. No distress.  HENT:  Head: Normocephalic and atraumatic.  Mouth/Throat: Oropharynx is clear and moist. No oropharyngeal exudate.  Eyes: Conjunctivae and EOM are normal. Pupils are equal, round, and reactive to light. Right eye exhibits no discharge. Left eye exhibits no discharge. No scleral icterus.  Neck: Normal range of motion. Neck supple. No JVD present. No thyromegaly present.  Cardiovascular: Regular rhythm, normal heart sounds and intact distal pulses.  Exam reveals no gallop and no friction rub.   No murmur heard. Mild tachycardia  Pulmonary/Chest: Effort normal and breath sounds normal. No respiratory distress. He has no wheezes. He has no rales.  Abdominal: Soft. Bowel sounds are normal. He exhibits no distension and no mass. There is tenderness (bilateral upper abdominal tenderness, no  guarding or peritoneal signs, diffuse tympanitic sounds to percussion).  Musculoskeletal: Normal range of motion. He exhibits tenderness (tenderness across the left mid back across the left flank and into the left upper quadrant). He exhibits no edema.  Lymphadenopathy:    He has no cervical adenopathy.  Neurological: He is alert. Coordination normal.  Skin: Skin is warm and dry. No rash noted. No erythema.  Psychiatric: He has a normal mood and affect. His behavior is normal.  Nursing note and vitals reviewed.   ED Course  Procedures (including critical care time) Labs Review Labs Reviewed  LIPASE, BLOOD - Abnormal; Notable for the following:    Lipase 13 (*)    All other components within normal limits  COMPREHENSIVE METABOLIC PANEL - Abnormal; Notable for the following:    Glucose, Bld 139 (*)    Creatinine, Ser 0.59 (*)    Calcium 8.7 (*)    Total Protein 6.3 (*)    Albumin 2.8 (*)    Alkaline Phosphatase 148 (*)    All other components within normal limits  CBC WITH DIFFERENTIAL/PLATELET - Abnormal; Notable for the following:  WBC 11.3 (*)    Hemoglobin 12.0 (*)    HCT 36.9 (*)    Neutro Abs 7.8 (*)    Monocytes Absolute 1.3 (*)    All other components within normal limits  URINALYSIS, ROUTINE W REFLEX MICROSCOPIC - Abnormal; Notable for the following:    Color, Urine AMBER (*)    Bilirubin Urine SMALL (*)    All other components within normal limits  I-STAT TROPOININ, ED    Imaging Review Dg Chest 2 View  10/14/2014   CLINICAL DATA:  Cough for 2-3 weeks. Congestion, chills. Decreased breath sounds on the left.  EXAM: CHEST  2 VIEW  COMPARISON:  03/12/2014  FINDINGS: Low lung volumes. Diffuse right lung opacity, most confluent in the right base. Left basilar airspace opacity also present, likely atelectasis. No visible effusions. Large hiatal hernia. No acute bony abnormality. Severe kyphosis and scoliosis limits the study.  IMPRESSION: Bilateral airspace opacities,  right greater than left. Cannot exclude pneumonia in the right lung. Left basilar opacity likely reflects atelectasis.  Large hiatal hernia.   Electronically Signed   By: Charlett NoseKevin  Dover M.D.   On: 10/14/2014 18:06   Ct Abdomen Pelvis W Contrast  10/15/2014   CLINICAL DATA:  Abdominal pain, diarrhea.  EXAM: CT ABDOMEN AND PELVIS WITH CONTRAST  TECHNIQUE: Multidetector CT imaging of the abdomen and pelvis was performed using the standard protocol following bolus administration of intravenous contrast.  CONTRAST:  100mL OMNIPAQUE IOHEXOL 300 MG/ML  SOLN  COMPARISON:  None.  FINDINGS: Lower chest: Small right pleural effusion and trace left pleural effusion. Large hiatal hernia. Airspace opacities in both lower lobes, likely atelectasis. Heart is normal size.  Hepatobiliary: Small gallstone noted layering in the gallbladder. No focal hepatic abnormality.  Pancreas: No focal abnormality or ductal dilatation.  Spleen: No focal abnormality.  Normal size.  Adrenals/Urinary Tract: No focal renal or adrenal abnormality. No hydronephrosis. Diffuse urinary bladder wall thickening.  Stomach/Bowel: Stomach is decompressed. No small bowel abnormality. There is diffuse colonic wall thickening compatible with colitis  Vascular/Lymphatic: No retroperitoneal or mesenteric adenopathy. Aorta normal caliber.  Reproductive: No mass or other significant abnormality.  Other: No free fluid or free air.  Musculoskeletal: Degenerative disc and facet disease throughout the lumbar spine. No acute bony abnormality.  IMPRESSION: Diffuse colonic wall thickening compatible with pan colitis.  Urinary bladder wall thickening may be related to some degree of bladder outlet obstruction or cystitis. Recommend clinical correlation.  Cholelithiasis.  Small bilateral pleural effusions.  Large hiatal hernia.   Electronically Signed   By: Charlett NoseKevin  Dover M.D.   On: 10/15/2014 20:53   Dg Chest Port 1 View  10/15/2014   CLINICAL DATA:  Acute onset of shortness  of breath. Nonproductive cough. Generalized abdominal pain and diarrhea. Back and left shoulder pain. Initial encounter.  EXAM: PORTABLE CHEST - 1 VIEW  COMPARISON:  Chest radiograph from 10/14/2014  FINDINGS: The lungs are hypoexpanded. Vascular crowding and vascular congestion are seen. A small right pleural effusion is suspected. Mildly increased interstitial markings raise concern for interstitial edema. No pneumothorax is seen.  The cardiomediastinal silhouette is mildly enlarged. No acute osseous abnormalities are identified. A large hiatal hernia is again noted.  IMPRESSION: 1. Lungs hypoexpanded. Vascular congestion and mild cardiomegaly. Small right pleural effusion suspected. Mildly increased interstitial markings raise concern for interstitial edema. 2. Large hiatal hernia again noted.   Electronically Signed   By: Roanna RaiderJeffery  Chang M.D.   On: 10/15/2014 19:26  MDM   Final diagnoses:  Cough  Pancolitis, without complications    There is ongoing shortness of breath with occasional cough, abdominal pain is both right and left-sided, seems to fluctuate from side to side, there is been no vomiting, he did have diarrhea. Will need investigation as to the source of his symptoms, his vital signs show mild tachycardia and tachypnea, oxygen and 93% at this time, rule out worsening pneumonia or intra-abdominal process. The patient and caregiver are in agreement with the plan.  Pt has pancolitis on CT, slight leukocysotis,  meds as below and admit - d/w FP resident Dr. Gayla Doss who will admit  Meds given in ED:  Medications  ciprofloxacin (CIPRO) IVPB 400 mg (400 mg Intravenous New Bag/Given 10/15/14 2154)  ondansetron (ZOFRAN) injection 4 mg (4 mg Intravenous Given 10/15/14 1859)  fentaNYL (SUBLIMAZE) injection 50 mcg (50 mcg Intravenous Given 10/15/14 1859)  iohexol (OMNIPAQUE) 300 MG/ML solution 100 mL (100 mLs Intravenous Contrast Given 10/15/14 2016)  metroNIDAZOLE (FLAGYL) tablet 500 mg  (500 mg Oral Given 10/15/14 2154)    New Prescriptions   No medications on file      Eber Hong, MD 10/15/14 2224  Eber Hong, MD 10/15/14 2309

## 2014-10-15 NOTE — ED Notes (Signed)
Per admitting md Dr Adela Glimpseoutova hold pt until she sees in ED

## 2014-10-15 NOTE — ED Notes (Signed)
Per EMS. Pt from group home off [address] [facility] Pt was diagnosed with PNA 2 days ago, EMS was called out for SOB today. Pt reports non-productive cough. Pt also reports abd pain with diarrhea 2-3x a day, no vomiting. Pt also reports back and L shoulder pain.

## 2014-10-15 NOTE — ED Notes (Signed)
Pt is awaiting CT scan,  Pt has care giver at bedside,  Pt has CP

## 2014-10-15 NOTE — ED Notes (Signed)
Attempted to call floor to make aware of delay in transport, no answer , multiple rings

## 2014-10-16 ENCOUNTER — Encounter (HOSPITAL_COMMUNITY): Payer: Self-pay | Admitting: *Deleted

## 2014-10-16 DIAGNOSIS — I4581 Long QT syndrome: Secondary | ICD-10-CM | POA: Diagnosis present

## 2014-10-16 DIAGNOSIS — M868X7 Other osteomyelitis, ankle and foot: Secondary | ICD-10-CM | POA: Diagnosis present

## 2014-10-16 DIAGNOSIS — R32 Unspecified urinary incontinence: Secondary | ICD-10-CM | POA: Diagnosis present

## 2014-10-16 DIAGNOSIS — K51919 Ulcerative colitis, unspecified with unspecified complications: Secondary | ICD-10-CM | POA: Diagnosis not present

## 2014-10-16 DIAGNOSIS — M25512 Pain in left shoulder: Secondary | ICD-10-CM | POA: Diagnosis not present

## 2014-10-16 DIAGNOSIS — R05 Cough: Secondary | ICD-10-CM | POA: Diagnosis not present

## 2014-10-16 DIAGNOSIS — L89899 Pressure ulcer of other site, unspecified stage: Secondary | ICD-10-CM | POA: Diagnosis present

## 2014-10-16 DIAGNOSIS — K5100 Ulcerative (chronic) pancolitis without complications: Secondary | ICD-10-CM | POA: Diagnosis present

## 2014-10-16 DIAGNOSIS — E785 Hyperlipidemia, unspecified: Secondary | ICD-10-CM | POA: Diagnosis present

## 2014-10-16 DIAGNOSIS — M8660 Other chronic osteomyelitis, unspecified site: Secondary | ICD-10-CM | POA: Diagnosis not present

## 2014-10-16 DIAGNOSIS — J189 Pneumonia, unspecified organism: Secondary | ICD-10-CM | POA: Diagnosis present

## 2014-10-16 DIAGNOSIS — K529 Noninfective gastroenteritis and colitis, unspecified: Secondary | ICD-10-CM

## 2014-10-16 DIAGNOSIS — R109 Unspecified abdominal pain: Secondary | ICD-10-CM | POA: Diagnosis present

## 2014-10-16 DIAGNOSIS — R079 Chest pain, unspecified: Secondary | ICD-10-CM | POA: Diagnosis not present

## 2014-10-16 DIAGNOSIS — R140 Abdominal distension (gaseous): Secondary | ICD-10-CM | POA: Diagnosis not present

## 2014-10-16 DIAGNOSIS — Z91048 Other nonmedicinal substance allergy status: Secondary | ICD-10-CM | POA: Diagnosis not present

## 2014-10-16 DIAGNOSIS — D509 Iron deficiency anemia, unspecified: Secondary | ICD-10-CM | POA: Diagnosis present

## 2014-10-16 DIAGNOSIS — K5900 Constipation, unspecified: Secondary | ICD-10-CM | POA: Diagnosis present

## 2014-10-16 DIAGNOSIS — K8020 Calculus of gallbladder without cholecystitis without obstruction: Secondary | ICD-10-CM | POA: Diagnosis present

## 2014-10-16 DIAGNOSIS — Z8711 Personal history of peptic ulcer disease: Secondary | ICD-10-CM | POA: Diagnosis not present

## 2014-10-16 DIAGNOSIS — K449 Diaphragmatic hernia without obstruction or gangrene: Secondary | ICD-10-CM | POA: Diagnosis present

## 2014-10-16 DIAGNOSIS — F79 Unspecified intellectual disabilities: Secondary | ICD-10-CM | POA: Diagnosis present

## 2014-10-16 DIAGNOSIS — G808 Other cerebral palsy: Secondary | ICD-10-CM | POA: Diagnosis present

## 2014-10-16 MED ORDER — PANTOPRAZOLE SODIUM 40 MG PO PACK
40.0000 mg | PACK | Freq: Every day | ORAL | Status: DC
  Administered 2014-10-16 – 2014-10-19 (×4): 40 mg via ORAL
  Filled 2014-10-16 (×6): qty 20

## 2014-10-16 MED ORDER — DIVALPROEX SODIUM 125 MG PO CSDR
500.0000 mg | DELAYED_RELEASE_CAPSULE | Freq: Two times a day (BID) | ORAL | Status: DC
  Administered 2014-10-16 – 2014-10-20 (×9): 500 mg via ORAL
  Filled 2014-10-16 (×10): qty 4

## 2014-10-16 MED ORDER — RISPERIDONE 2 MG PO TABS
2.0000 mg | ORAL_TABLET | Freq: Every day | ORAL | Status: DC
  Administered 2014-10-16 – 2014-10-19 (×4): 2 mg via ORAL
  Filled 2014-10-16 (×5): qty 1

## 2014-10-16 MED ORDER — METRONIDAZOLE 500 MG PO TABS
500.0000 mg | ORAL_TABLET | Freq: Three times a day (TID) | ORAL | Status: DC
  Administered 2014-10-16 – 2014-10-19 (×8): 500 mg via ORAL
  Filled 2014-10-16 (×12): qty 1

## 2014-10-16 MED ORDER — LORAZEPAM 1 MG PO TABS
1.0000 mg | ORAL_TABLET | ORAL | Status: DC | PRN
  Administered 2014-10-16 – 2014-10-17 (×2): 1 mg via ORAL
  Filled 2014-10-16 (×2): qty 1

## 2014-10-16 MED ORDER — ONDANSETRON HCL 4 MG/2ML IJ SOLN: 4.0000 mg | Freq: Three times a day (TID) | INTRAMUSCULAR | Status: DC | PRN

## 2014-10-16 MED ORDER — ONDANSETRON HCL 4 MG/2ML IJ SOLN: 4.0000 mg | Freq: Four times a day (QID) | INTRAMUSCULAR | Status: DC | PRN

## 2014-10-16 MED ORDER — TRAMADOL HCL 50 MG PO TABS
50.0000 mg | ORAL_TABLET | Freq: Four times a day (QID) | ORAL | Status: DC | PRN
  Administered 2014-10-16 – 2014-10-17 (×4): 50 mg via ORAL
  Filled 2014-10-16 (×4): qty 1

## 2014-10-16 MED ORDER — ACETAMINOPHEN 650 MG RE SUPP
650.0000 mg | Freq: Four times a day (QID) | RECTAL | Status: DC | PRN
  Administered 2014-10-17: 650 mg via RECTAL
  Filled 2014-10-16: qty 1

## 2014-10-16 MED ORDER — HEPARIN SODIUM (PORCINE) 5000 UNIT/ML IJ SOLN
5000.0000 [IU] | Freq: Three times a day (TID) | INTRAMUSCULAR | Status: DC
  Administered 2014-10-16 – 2014-10-20 (×12): 5000 [IU] via SUBCUTANEOUS
  Filled 2014-10-16 (×13): qty 1

## 2014-10-16 MED ORDER — SODIUM CHLORIDE 0.9 % IV SOLN
INTRAVENOUS | Status: AC
  Administered 2014-10-16: 12:00:00 via INTRAVENOUS

## 2014-10-16 MED ORDER — SODIUM CHLORIDE 0.9 % IV SOLN: 250.0000 mL | INTRAVENOUS | Status: DC | PRN

## 2014-10-16 MED ORDER — ONDANSETRON HCL 4 MG PO TABS: 4.0000 mg | ORAL_TABLET | Freq: Four times a day (QID) | ORAL | Status: DC | PRN

## 2014-10-16 MED ORDER — FENTANYL CITRATE (PF) 100 MCG/2ML IJ SOLN
50.0000 ug | Freq: Once | INTRAMUSCULAR | Status: AC
  Administered 2014-10-16: 50 ug via INTRAVENOUS
  Filled 2014-10-16: qty 2

## 2014-10-16 MED ORDER — ACETAMINOPHEN 325 MG PO TABS
650.0000 mg | ORAL_TABLET | Freq: Four times a day (QID) | ORAL | Status: DC | PRN
  Administered 2014-10-16 – 2014-10-20 (×4): 650 mg via ORAL
  Filled 2014-10-16 (×4): qty 2

## 2014-10-16 MED ORDER — SODIUM CHLORIDE 0.9 % IV SOLN: INTRAVENOUS | Status: DC

## 2014-10-16 MED ORDER — DIVALPROEX SODIUM 125 MG PO CPSP: 500.0000 mg | ORAL_CAPSULE | Freq: Two times a day (BID) | ORAL | Status: DC

## 2014-10-16 MED ORDER — BACLOFEN 20 MG PO TABS
20.0000 mg | ORAL_TABLET | Freq: Four times a day (QID) | ORAL | Status: DC
  Administered 2014-10-16 – 2014-10-20 (×14): 20 mg via ORAL
  Filled 2014-10-16 (×19): qty 1

## 2014-10-16 MED ORDER — LANSOPRAZOLE 30 MG PO TBDP: 30.0000 mg | ORAL_TABLET | Freq: Every day | ORAL | Status: DC

## 2014-10-16 MED ORDER — TIZANIDINE HCL 2 MG PO TABS
2.0000 mg | ORAL_TABLET | Freq: Three times a day (TID) | ORAL | Status: DC
  Administered 2014-10-16 – 2014-10-20 (×12): 2 mg via ORAL
  Filled 2014-10-16 (×14): qty 1

## 2014-10-16 MED ORDER — LEVOFLOXACIN 750 MG PO TABS
750.0000 mg | ORAL_TABLET | Freq: Every day | ORAL | Status: DC
  Administered 2014-10-16 – 2014-10-18 (×3): 750 mg via ORAL
  Filled 2014-10-16 (×3): qty 1

## 2014-10-16 MED ORDER — SODIUM CHLORIDE 0.9 % IJ SOLN: 3.0000 mL | INTRAMUSCULAR | Status: DC | PRN

## 2014-10-16 MED ORDER — SODIUM CHLORIDE 0.9 % IJ SOLN
3.0000 mL | Freq: Two times a day (BID) | INTRAMUSCULAR | Status: DC
  Administered 2014-10-16 – 2014-10-20 (×8): 3 mL via INTRAVENOUS

## 2014-10-16 NOTE — ED Notes (Signed)
Pt resting quietly; no complaints

## 2014-10-16 NOTE — Progress Notes (Signed)
Spoke with [facility] ED RN to check ETA for patient.  [facility] only has one truck and multiple calls ahead of this one.  More than likely the transfer of patient will not happen until first shift.  [facility] & MinorKimberly Bowyn Mercier RN-BC, WTA.

## 2014-10-16 NOTE — Progress Notes (Signed)
10/16/2014 5:25 PM  Patient came in as a new admission with a right great toe dressing. Per patient the dressing has been changed by an outpatient wound center, that he has been going to for at least two or more years. Dressing is clean, dry and intact. Very neatly placed, no drainage or mal odor coming from the dressing. Unable to see to the wound or great toe at all due to the dressing. Informed Family Medicine MD. MD wrote new orders for wound consult and requested that we not remove the dressing until wound care assesses the patient. Will inform his primary RN about the chain of events that have occurred.   PACCAR Incyanne Hill BSN, RN-BC, RN3 [facility] Phone [phone] Activate Charge Nurse

## 2014-10-16 NOTE — H&P (Signed)
Family Medicine Teaching [facility] Admission History and Physical Service Pager: [phone]  Patient name: [name] Medical record number: [id number] Date of birth: 07-30-1960 Age: 54 y.o. Gender: male  Primary Care Provider: Maryjean Ka, MD Consultants: none Code Status: Full (pending discussion with sister)  Chief Complaint: abdominal pain  Assessment and Plan: [name] is a 54 y.o. male presenting with abdominal pain and pancolitis. PMH is significant for cerebral palsy with mental handicap, HLD, urinary incontinence, acne vulgaris.  # Abdominal pain / pancolitis: x 1 day with some reported diarrhea. Vitals stable, afebrile. CT abdomen showing diffuse colonic wall thickening, cholelithiasis, large hiatal hernia - admit to inpatient - keep on liquid diet - will continue levaquin and flagyl PO for enteric coverage (received 1 dose cipro in ED) - received several fentanyl injections, will trial of home tramadol for pain - enteric precautions - check c diff  # CAP?: CXR difficult to read with poor inspiration. Will get CAP coverage with levaquin above - monitor clinically - supplemental o2 to keep sat >92%, wean as tolerated  # Cerebral palsy - continue home muscle relaxant regimen of baclofen+tizanidine - continue home lorazepam - continue home depakote 500mg  BID - continue home risperdal  FEN/GI: liquid diet / saline lock Prophylaxis: heparin sq  Disposition: admit  History of Present Illness: [name] is a 54 y.o. male presenting with abdominal pain x 1 day. Pt recently seen in clinic 2 days ago for shortness of breath and was prescribed levaquin for possible pneumonia of which he received only one dose. Began complaining of abdominal pain yesterday and did have some diarrhea (reported as 1 episode) that was nonbloody, not black. The pain is intermittent, unable to say what makes it better, it is worse with pushing on his stomach. He does feel a  little short of breath, and has had a cough.   Employees of group home at bedside, and legal guardian. Guardian stated she would contact sister in [place] for code status.  Review Of Systems: Per HPI with the following additions: +cough, no CP, no SOB, no dysuria Otherwise 12 point review of systems was performed and was unremarkable.  Patient Active Problem List   Diagnosis Date Noted  . Colitis 10/15/2014  . Pancolitis 10/15/2014  . Cough 10/14/2014  . Pressure ulcer of foot 03/14/2014  . Community acquired pneumonia 03/13/2014  . CAP (community acquired pneumonia) 03/13/2014  . Knee pain 11/23/2013  . Healthcare maintenance 06/24/2013  . 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 Onychomycosis 01/13/2013  . Neuropathy 01/13/2013  . Pressure ulcer 01/08/2013  . 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 Medication monitoring encounter 07/10/2011  . Hyperlipidemia 07/02/2010  . RENAL GLYCOSURIA 02/26/2010  . URINARY INCONTINENCE 02/09/2010  . 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 ECZEMA 06/09/2008  . 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 Mental retardation 07/24/2006  . Infantile cerebral palsy 07/24/2006  . GASTRIC ULCER ACUTE WITH HEMORRHAGE 07/24/2006   Past Medical History: Past Medical History  Diagnosis Date  . 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 CEREBRAL PALSY 07/24/2006  . HYPERLIPIDEMIA 07/02/2010  . GASTRIC ULCER ACUTE WITH HEMORRHAGE 07/24/2006  . 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 URINARY INCONTINENCE 02/09/2010   Past Surgical History: Past Surgical History  Procedure Laterality Date  . Stomach surgery      as an infant "couldn't keep food down"   Social History: History  Substance Use Topics  . Smoking status: Never Smoker   . Smokeless tobacco: Never Used  .  Alcohol Use: No   Additional social history: lives in group home Please also refer to relevant sections of EMR.  Family History: History reviewed. No pertinent family history. Allergies and Medications: Allergies   Allergen Reactions  . Adhesive [Tape] Rash   No current facility-administered medications on file prior to encounter.   Current Outpatient Prescriptions on File Prior to Encounter  Medication Sig Dispense Refill  . baclofen (LIORESAL) 20 MG tablet Take 1 tablet (20 mg total) by mouth 4 (four) times daily. 120 each 5  . diclofenac sodium (VOLTAREN) 1 % GEL Apply 2 g topically 4 (four) times daily. 3 Tube 2  . divalproex (DEPAKOTE SPRINKLE) 125 MG capsule Take 500 mg by mouth 2 (two) times daily.     . Elastic Bandages & Supports (JOBST ANTI-EM KNEE HIGH MED) MISC by Does not apply route. Apply once in the morning and remove at bedtime daily    . Hydrocortisone Acetate 1 % CREA Apply 1 application topically daily. To face, every PM. 30 g 2  . ketoconazole (NIZORAL) 2 % cream Apply 1 application topically daily. Apply to face, skin folds, groin, underarms once daily    . ketoconazole (NIZORAL) 2 % shampoo Apply 1 application topically 2 (two) times a week. Monday and Friday    . lansoprazole (PREVACID SOLUTAB) 30 MG disintegrating tablet Take 1 tablet (30 mg total) by mouth daily. 90 tablet 3  . levofloxacin (LEVAQUIN) 750 MG tablet Take 1 tablet (750 mg total) by mouth daily. 14 tablet 0  . LORazepam (ATIVAN) 1 MG tablet Take 1 mg by mouth every 4 (four) hours as needed for anxiety (anxiety).     . Multiple Vitamin (MULTIVITAMIN) capsule Take 1 capsule by mouth daily. 30 capsule 11  . risperiDONE (RISPERDAL) 2 MG tablet Take 2 mg by mouth at bedtime.    . silver sulfADIAZINE (SILVADENE) 1 % cream Apply 1 application topically daily.     Marland Kitchen tiZANidine (ZANAFLEX) 4 MG tablet Take 2 mg by mouth 3 (three) times daily.     . traMADol (ULTRAM) 50 MG tablet Take 1 tablet (50 mg total) by mouth every 6 (six) hours as needed. For knee or joint pain. 50 tablet 1  . tretinoin (RETIN-A) 0.025 % gel Apply 1 applicator topically at bedtime. 45 g 2  . tiZANidine (ZANAFLEX) 2 MG tablet Take 1 tablet (2 mg  total) by mouth 3 (three) times daily. (Patient not taking: Reported on 10/15/2014) 90 tablet 5  . tolterodine (DETROL LA) 2 MG 24 hr capsule Take 1 capsule (2 mg total) by mouth daily. (Patient not taking: Reported on 10/15/2014) 90 capsule 3  . white petrolatum (VASELINE) GEL Apply 1 application topically daily. (Patient not taking: Reported on 10/15/2014) 106 g 0  . [DISCONTINUED] tolterodine (DETROL LA) 2 MG 24 hr capsule Take 1 capsule (2 mg total) by mouth daily. 90 capsule 3    Objective: BP 107/66 mmHg  Pulse 87  Temp(Src) 97.9 F (36.6 C) (Oral)  Resp 18  SpO2 95% Exam: General: NAD, lying in bed and appears very pleasant Eyes: PERRL, EOMI. ENTM: mouth mildly dry, patent nares. Neck: supple, no thyromegaly Cardiovascular: RRR, nl s1s2, no murmurs appreciated. 2+ radial pulses bilaterally Respiratory: mostly clear normally though diminished, no wheezes or crackles appreciated. Normal effort. Abdomen: soft, tender primarily to right side, right periumbilical. No rebound or guarding. Tympanic to percussion. Bowel sounds normal. MSK: Right great toe wrapped in bandage. Left toes curled downward in contracture Skin: no  rashes, as above right toe has an ulcer with bandage over it Neuro: alert, oriented, able to converse and appropriately answers questions but does have mental handicap. Follows directions.  Labs and Imaging: CBC BMET   Recent Labs Lab 10/15/14 1844  WBC 11.3*  HGB 12.0*  HCT 36.9*  PLT 230    Recent Labs Lab 10/15/14 1844  NA 135  K 3.9  CL 101  CO2 24  BUN 12  CREATININE 0.59*  GLUCOSE 139*  CALCIUM 8.7*     Dg Chest 2 View  10/14/2014   CLINICAL DATA:  Cough for 2-3 weeks. Congestion, chills. Decreased breath sounds on the left.  EXAM: CHEST  2 VIEW  COMPARISON:  03/12/2014  FINDINGS: Low lung volumes. Diffuse right lung opacity, most confluent in the right base. Left basilar airspace opacity also present, likely atelectasis. No visible effusions.  Large hiatal hernia. No acute bony abnormality. Severe kyphosis and scoliosis limits the study.  IMPRESSION: Bilateral airspace opacities, right greater than left. Cannot exclude pneumonia in the right lung. Left basilar opacity likely reflects atelectasis.  Large hiatal hernia.   Electronically Signed   By: Charlett NoseKevin  Dover M.D.   On: 10/14/2014 18:06   Ct Abdomen Pelvis W Contrast  10/15/2014   CLINICAL DATA:  Abdominal pain, diarrhea.  EXAM: CT ABDOMEN AND PELVIS WITH CONTRAST  TECHNIQUE: Multidetector CT imaging of the abdomen and pelvis was performed using the standard protocol following bolus administration of intravenous contrast.  CONTRAST:  100mL OMNIPAQUE IOHEXOL 300 MG/ML  SOLN  COMPARISON:  None.  FINDINGS: Lower chest: Small right pleural effusion and trace left pleural effusion. Large hiatal hernia. Airspace opacities in both lower lobes, likely atelectasis. Heart is normal size.  Hepatobiliary: Small gallstone noted layering in the gallbladder. No focal hepatic abnormality.  Pancreas: No focal abnormality or ductal dilatation.  Spleen: No focal abnormality.  Normal size.  Adrenals/Urinary Tract: No focal renal or adrenal abnormality. No hydronephrosis. Diffuse urinary bladder wall thickening.  Stomach/Bowel: Stomach is decompressed. No small bowel abnormality. There is diffuse colonic wall thickening compatible with colitis  Vascular/Lymphatic: No retroperitoneal or mesenteric adenopathy. Aorta normal caliber.  Reproductive: No mass or other significant abnormality.  Other: No free fluid or free air.  Musculoskeletal: Degenerative disc and facet disease throughout the lumbar spine. No acute bony abnormality.  IMPRESSION: Diffuse colonic wall thickening compatible with pan colitis.  Urinary bladder wall thickening may be related to some degree of bladder outlet obstruction or cystitis. Recommend clinical correlation.  Cholelithiasis.  Small bilateral pleural effusions.  Large hiatal hernia.    Electronically Signed   By: Charlett NoseKevin  Dover M.D.   On: 10/15/2014 20:53   Dg Chest Port 1 View  10/15/2014   CLINICAL DATA:  Acute onset of shortness of breath. Nonproductive cough. Generalized abdominal pain and diarrhea. Back and left shoulder pain. Initial encounter.  EXAM: PORTABLE CHEST - 1 VIEW  COMPARISON:  Chest radiograph from 10/14/2014  FINDINGS: The lungs are hypoexpanded. Vascular crowding and vascular congestion are seen. A small right pleural effusion is suspected. Mildly increased interstitial markings raise concern for interstitial edema. No pneumothorax is seen.  The cardiomediastinal silhouette is mildly enlarged. No acute osseous abnormalities are identified. A large hiatal hernia is again noted.  IMPRESSION: 1. Lungs hypoexpanded. Vascular congestion and mild cardiomegaly. Small right pleural effusion suspected. Mildly increased interstitial markings raise concern for interstitial edema. 2. Large hiatal hernia again noted.   Electronically Signed   By: Beryle BeamsJeffery  Chang M.D.  On: 10/15/2014 19:26     Nani Ravens, MD 10/16/2014, 10:01 AM PGY-2, [facility] FPTS Intern pager: [phone], text pages welcome

## 2014-10-16 NOTE — ED Notes (Signed)
Pt was wet with urine,  Pt was cleaned and bed linens changed,  Pt repositioned to left side,  Pillow used to help pt lay comfortably and folded sheet placed between legs to reduce friction, pt states better since adjustment

## 2014-10-16 NOTE — Progress Notes (Signed)
Report called and given by [facility] ED RN Tobi BastosAnna. Room ready for admission.

## 2014-10-16 NOTE — ED Notes (Signed)
Spoke with Tammy at [facility] pt is added to list

## 2014-10-16 NOTE — ED Notes (Signed)
[name] sister [name]/guardian  [phone],  [name] HouseholderKelley [name] (group home supervisor)  [phone]

## 2014-10-17 ENCOUNTER — Inpatient Hospital Stay (HOSPITAL_COMMUNITY): Payer: Medicare Other

## 2014-10-17 DIAGNOSIS — F79 Unspecified intellectual disabilities: Secondary | ICD-10-CM

## 2014-10-17 DIAGNOSIS — G808 Other cerebral palsy: Secondary | ICD-10-CM

## 2014-10-17 DIAGNOSIS — R140 Abdominal distension (gaseous): Secondary | ICD-10-CM | POA: Insufficient documentation

## 2014-10-17 DIAGNOSIS — R109 Unspecified abdominal pain: Secondary | ICD-10-CM | POA: Insufficient documentation

## 2014-10-17 LAB — CBC
HCT: 35.3 % — ABNORMAL LOW (ref 39.0–52.0)
Hemoglobin: 11.5 g/dL — ABNORMAL LOW (ref 13.0–17.0)
MCH: 25.7 pg — ABNORMAL LOW (ref 26.0–34.0)
MCHC: 32.6 g/dL (ref 30.0–36.0)
MCV: 79 fL (ref 78.0–100.0)
Platelets: 288 10*3/uL (ref 150–400)
RBC: 4.47 MIL/uL (ref 4.22–5.81)
RDW: 15.4 % (ref 11.5–15.5)
WBC: 9.2 10*3/uL (ref 4.0–10.5)

## 2014-10-17 LAB — BASIC METABOLIC PANEL
Anion gap: 11 (ref 5–15)
BUN: 5 mg/dL — AB (ref 6–20)
CALCIUM: 8.4 mg/dL — AB (ref 8.9–10.3)
CO2: 23 mmol/L (ref 22–32)
Chloride: 100 mmol/L — ABNORMAL LOW (ref 101–111)
Creatinine, Ser: 0.48 mg/dL — ABNORMAL LOW (ref 0.61–1.24)
GFR calc Af Amer: >60 mL/min (ref >60)
Glucose, Bld: 119 mg/dL — ABNORMAL HIGH (ref 65–99)
Potassium: 4 mmol/L (ref 3.5–5.1)
SODIUM: 134 mmol/L — AB (ref 135–145)

## 2014-10-17 LAB — TROPONIN I: Troponin I: <0.03 ng/mL (ref <0.031)

## 2014-10-17 LAB — LACTIC ACID, PLASMA: Lactic Acid, Venous: 1.4 mmol/L (ref 0.5–2.0)

## 2014-10-17 MED ORDER — ASPIRIN 81 MG PO CHEW
324.0000 mg | CHEWABLE_TABLET | Freq: Once | ORAL | Status: AC
  Administered 2014-10-17: 324 mg via ORAL
  Filled 2014-10-17: qty 4

## 2014-10-17 NOTE — Consult Note (Signed)
Reason for Consult:abd pain, colitis Referring Physician: Dr Gerlean Ren is an 54 y.o. male.  HPI: 54 yo WM with CP/MR admitted yesterday to the medicine service after developing nonspecific abdominal pain on Saturday with limited diarrhea. Mild WBC on presentation. CT showed hiatal hernia and pancolitis. Admitted bowel rest, IV abx. Per report pt's abdomen was more distended today and "exam worsened".   Pt tells me his stomach feels better. Denies n/v. Can't really tell me about gas/BMs. No BM recorded since admission. History is primarily from chart. History is very limited from pt. States the incision on abd was from hiatal hernia surgery  Past Medical History  Diagnosis Date  . 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 CEREBRAL PALSY 07/24/2006  . HYPERLIPIDEMIA 07/02/2010  . GASTRIC ULCER ACUTE WITH HEMORRHAGE 07/24/2006  . 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 URINARY INCONTINENCE 02/09/2010  . History of hiatal hernia     Past Surgical History  Procedure Laterality Date  . Stomach surgery      as an infant "couldn't keep food down"    History reviewed. No pertinent family history.  Social History:  reports that he has never smoked. He has never used smokeless tobacco. He reports that he does not drink alcohol or use illicit drugs.  Allergies:  Allergies  Allergen Reactions  . Adhesive [Tape] Rash    Medications: I have reviewed the patient's current medications.s  Results for orders placed or performed during the hospital encounter of 10/15/14 (from the past 48 hour(s))  CBC     Status: Abnormal   Collection Time: 10/17/14 11:32 AM  Result Value Ref Range   WBC 9.2 4.0 - 10.5 K/uL   RBC 4.47 4.22 - 5.81 MIL/uL   Hemoglobin 11.5 (L) 13.0 - 17.0 g/dL   HCT 35.3 (L) 39.0 - 52.0 %   MCV 79.0 78.0 - 100.0 fL   MCH 25.7 (L) 26.0 - 34.0 pg   MCHC 32.6 30.0 - 36.0 g/dL   RDW 15.4 11.5 - 15.5 %   Platelets 288 150 - 400 K/uL  Basic metabolic panel     Status: Abnormal   Collection  Time: 10/17/14 11:32 AM  Result Value Ref Range   Sodium 134 (L) 135 - 145 mmol/L   Potassium 4.0 3.5 - 5.1 mmol/L   Chloride 100 (L) 101 - 111 mmol/L   CO2 23 22 - 32 mmol/L   Glucose, Bld 119 (H) 65 - 99 mg/dL   BUN 5 (L) 6 - 20 mg/dL   Creatinine, Ser 0.48 (L) 0.61 - 1.24 mg/dL   Calcium 8.4 (L) 8.9 - 10.3 mg/dL   GFR calc non Af Amer >60 >60 mL/min   GFR calc Af Amer >60 >60 mL/min    Comment: (NOTE) The eGFR has been calculated using the CKD EPI equation. This calculation has not been validated in all clinical situations. eGFR's persistently <60 mL/min signify possible Chronic Kidney Disease.    Anion gap 11 5 - 15  Troponin I (q 6hr x 3)     Status: None   Collection Time: 10/17/14  6:28 PM  Result Value Ref Range   Troponin I <0.03 <0.031 ng/mL    Comment:        NO INDICATION OF MYOCARDIAL INJURY.     Dg Abd 1 View  10/17/2014   CLINICAL DATA:  Abdominal pain  EXAM: ABDOMEN - 1 VIEW  COMPARISON:  CT 10/15/2014  FINDINGS: Mottling of the bowel gas pattern particularly over the  right mid abdomen is most compatible with stool/bowel content as seen previously. Pneumatosis could appear similar and could be evaluated at decubitus or upright imaging as clinically indicated. Moderate thumbprinting compatible with previously seen mural edema is reidentified over the left mid abdomen. Presence or absence of air-fluid levels or free air is suboptimally evaluated on this supine projection. Clips project over the epigastrium. No acute osseous abnormality.  IMPRESSION: Left mid abdominal colonic wall thickening compatible previously seen edema/colitis. No new acute abnormality.   Electronically Signed   By: Conchita Paris M.D.   On: 10/17/2014 11:14   Ct Abdomen Pelvis W Contrast  10/15/2014   CLINICAL DATA:  Abdominal pain, diarrhea.  EXAM: CT ABDOMEN AND PELVIS WITH CONTRAST  TECHNIQUE: Multidetector CT imaging of the abdomen and pelvis was performed using the standard protocol  following bolus administration of intravenous contrast.  CONTRAST:  139m OMNIPAQUE IOHEXOL 300 MG/ML  SOLN  COMPARISON:  None.  FINDINGS: Lower chest: Small right pleural effusion and trace left pleural effusion. Large hiatal hernia. Airspace opacities in both lower lobes, likely atelectasis. Heart is normal size.  Hepatobiliary: Small gallstone noted layering in the gallbladder. No focal hepatic abnormality.  Pancreas: No focal abnormality or ductal dilatation.  Spleen: No focal abnormality.  Normal size.  Adrenals/Urinary Tract: No focal renal or adrenal abnormality. No hydronephrosis. Diffuse urinary bladder wall thickening.  Stomach/Bowel: Stomach is decompressed. No small bowel abnormality. There is diffuse colonic wall thickening compatible with colitis  Vascular/Lymphatic: No retroperitoneal or mesenteric adenopathy. Aorta normal caliber.  Reproductive: No mass or other significant abnormality.  Other: No free fluid or free air.  Musculoskeletal: Degenerative disc and facet disease throughout the lumbar spine. No acute bony abnormality.  IMPRESSION: Diffuse colonic wall thickening compatible with pan colitis.  Urinary bladder wall thickening may be related to some degree of bladder outlet obstruction or cystitis. Recommend clinical correlation.  Cholelithiasis.  Small bilateral pleural effusions.  Large hiatal hernia.   Electronically Signed   By: KRolm BaptiseM.D.   On: 10/15/2014 20:53   Dg Abd 2 Views  10/17/2014   CLINICAL DATA:  Abdominal pain for 3 days.  Initial encounter.  EXAM: ABDOMEN - 2 VIEW  COMPARISON:  Radiographs 10/17/2014 and CT 10/15/2014.  FINDINGS: No definite pneumatosis or extraluminal air identified on these views. Colonic wall thickening and prominent stool in the right colon are again noted. Surgical clips are present within the upper abdomen. Known large hiatal hernia not well visualized.  IMPRESSION: No acute findings. No radiographic evidence of bowel perforation or definite  pneumatosis.   Electronically Signed   By: WRichardean SaleM.D.   On: 10/17/2014 18:14   Dg Toe Great Right  10/17/2014   CLINICAL DATA:  Right great toe pain for 3 days.  Initial encounter.  EXAM: RIGHT GREAT TOE  COMPARISON:  11/02/2013 radiographs.  FINDINGS: Postsurgical changes status post arthrodesis at the first metatarsal phalangeal joint are again noted. Dorsal plate and screws are intact without evidence of loosening. There is progressive widening of the interphalangeal joint with apparent lysis of the proximal phalangeal head. The bones are demineralized. There is no evidence of acute fracture or dislocation.  IMPRESSION: Interval widening of the interphalangeal joint of the great toe with apparent lysis of the proximal phalangeal head. In the absence of interval surgery, this is suspicious for a septic joint and possible osteomyelitis. Correlate clinically.   Electronically Signed   By: WRichardean SaleM.D.   On: 10/17/2014 18:17  Review of Systems  Unable to perform ROS: mental acuity   Blood pressure 132/77, pulse 94, temperature 98.3 F (36.8 C), temperature source Oral, resp. rate 20, height 6' (1.829 m), weight 83.6 kg (184 lb 4.9 oz), SpO2 96 %. Physical Exam  Vitals reviewed. Constitutional: He appears well-developed and well-nourished. No distress.  obese  HENT:  Head: Normocephalic and atraumatic.  Right Ear: External ear normal.  Left Ear: External ear normal.  Eyes: Conjunctivae are normal. No scleral icterus.  Neck: Neck supple. No tracheal deviation present. No thyromegaly present.  Cardiovascular: Normal rate and normal heart sounds.   Respiratory: Effort normal and breath sounds normal. No stridor. No respiratory distress. He has no wheezes.  Some dec BS at bases  GI: Soft. There is no tenderness. There is no rigidity, no rebound and no guarding. No hernia.    Soft, perhaps some mild distension - more abd fullness. Scattered BS. Not really tender. Doesn't  grimace  Musculoskeletal: He exhibits no edema or tenderness.  Neurological: He is alert. He displays atrophy. He exhibits normal muscle tone.  Alert, somewhat oriented [place], name; doesn't know month, year  Skin: Skin is warm and dry. No rash noted. He is not diaphoretic. No erythema. No pallor.  Psychiatric: He has a normal mood and affect. His behavior is normal.    Assessment/Plan: CP/MR pancolitis Shoulder pain  There is no peritonitis on exam. His abdomen is that distended. He does not really grimace or seem really tender on palpation. He has a normal wbc. His wbc yesterday may have been due to dehydration.   i think he can proceed with clears. Cont IV abx.  May consider a dose of Miralax tomorrow but will see how does overnight first. Our team will see tomorrow.   Leighton Ruff. Redmond Pulling, MD, FACS General, Bariatric, & Minimally Invasive Surgery [facility] Surgery, [place]   [facility] M 10/17/2014, 8:06 PM

## 2014-10-17 NOTE — Progress Notes (Signed)
Family Medicine Teaching Service Daily Progress Note Intern Pager: [phone]  Patient name: [name] Rhineerry L Kite Medical record number: [id number] Date of birth: 04/06/1961 Age: 54 y.o. Gender: male  Primary Care Provider: Maryjean KaStreet, Christopher, MD Consultants: None Code Status: Full  Pt Overview and Major Events to Date:  5/22 - Admitted with pancolitis  Assessment and Plan: [name] is a 54 y.o. male presenting with abdominal pain and pancolitis. PMH is significant for cerebral palsy with mental handicap, HLD, urinary incontinence, acne vulgaris.  # Abdominal pain / pancolitis: x 1 day with some reported diarrhea. Vitals stable, afebrile. CT abdomen showing diffuse colonic wall thickening,  cholelithiasis, large hiatal hernia. Diarrhea resolved. Has been passing flatus - Liquid diet, advance as tolerated - s/p 1 dose cipro in ED 5/21 - Levaquin (5/22- ), flagyl (5/22- ) - Tramadol prn pain.  - enteric precautions - Consider laxative or enema to relieve stool burden  # Chest pain. Developed chest pain on 5/23. Sharp. Lower sternal. Reproducible on exam. CAP and Hiatal hernia possibly also contributing. EKG with nonspecific T wave changes in anterior leads, poor R wave progression. ASA given.  - Trend troponins - Repeat EKG in AM  # CAP?: CXR difficult to read with poor inspiration. Will get CAP coverage with levaquin above - monitor clinically - supplemental o2 to keep sat >92%, wean as tolerated  # Cerebral palsy - continue home muscle relaxant regimen of baclofen+tizanidine - continue home lorazepam - continue home depakote 500mg  BID - continue home risperdal  FEN/GI: liquid diet / saline lock Prophylaxis: heparin sq  Disposition: Admitted pending above work up.   Subjective:  No complaints this morning. No further episodes of diarrhea. No fevers or chills. Passed flatus yesterday. Started complaining of sharp chest pain this afternoon. No new SOB.  Objective: Temp:   [97.9 F (36.6 C)-100.5 F (38.1 C)] 98.5 F (36.9 C) (05/23 0653) Pulse Rate:  [81-92] 92 (05/23 0519) Resp:  [16-20] 20 (05/23 0519) BP: (100-132)/(57-66) 102/61 mmHg (05/23 0519) SpO2:  [95 %-98 %] 95 % (05/23 0519) Physical Exam: General: NAD, lying in bed and appears very pleasant Eyes: PERRL, EOMI. ENTM: mouth mildly dry, patent nares. Neck: supple, no thyromegaly Cardiovascular: RRR, nl s1s2, no murmurs appreciated. 2+ radial pulses bilaterally Respiratory: mostly clear normally though diminished, no wheezes or crackles appreciated. Normal effort. Abdomen: Distended, tender to deep palpation. No rebound. Bowel sounds normal. MSK: Right great toe wrapped in bandage. Left toes curled downward in contracture Skin: no rashes, as above right toe has an ulcer with bandage over it Neuro: alert, oriented, able to converse and appropriately answers questions but does have mental handicap. Follows directions.  Laboratory/Imaging/Diagnostic Tests: EKG: NSR, new TWI in V1, poor R wave progression (seen on previous EKGs)  Ardith Darkaleb M Parker, MD 10/17/2014, 8:12 AM PGY-1, [facility] FPTS Intern pager: [phone], text pages welcome

## 2014-10-17 NOTE — Progress Notes (Addendum)
FPTS Interim Progress Note  S: Patient complaining of left shoulder pain this after noon. Reports that it has "come and gone" for months. Started hurting more this afternoon. No recent trauma. Denies any further episodes of chest pain or shortness of breath.   Was able to pass gas while in xray. Still has some abdominal pain, but not severe.   O: Blood pressure 132/77, pulse 94, temperature 98.3 F (36.8 C), temperature source Oral, resp. rate 20, height 6' (1.829 m), weight 184 lb 4.9 oz (83.6 kg), SpO2 96 %. Gen: 54 yo man in NAD lying in hospital bed MSK: Left shoulder diffusely tender to palpation, No point tenderness. limited ROM secondary to spasticity ABD: Normal pitched, active bowel sounds. Tympanic to percussion. Notably distended. Diffusely mildly tender to palpation. No rebound tenderness, no guarding.   Abdominal film: No acute findings, no evidence for perforation or pneumatosis.   A/P Unclear etiology for left shoulder pain, but seems to be more chronic in nature. Abdominal pain possibly secondary to constipation, though cannot rule out obstruction or other intra-abdominal pathology. Plain film has no signs of acute changes. Will continue to monitor. Will consult surgery for further assistance.   Katina Degreealeb M. Jimmey RalphParker, MD [facility] Resident PGY-1 10/17/2014 7:01 PM

## 2014-10-17 NOTE — Progress Notes (Signed)
10/17/2014 11:09 AM  Patient complaining of chest pain once returning from CT. Vital signs were taken and stable. Informed MD. EKG ordered and labs. Nurse Tech at bedside. MD stated he would round on the patient again. Will continue to assess and monitor the patient.   PACCAR Incyanne Hill BSN, RN-BC, [employer] [facility] Phone [phone]

## 2014-10-17 NOTE — Consult Note (Addendum)
WOC wound consult note Reason for Consult: Consult requested for right great toe.  Pt states he has a chronic wound which is followed by a podiatrist as an outpatient.  Wound type: Full thickness 2X1X.2cm over toe joint area, bone visible. Wound bed: Dark red, moist woundbed Drainage (amount, consistency, odor) No odor, mod amt tan drainage Periwound: EMR noted previous edema and erythremia, which appears to have diminished. No discomfort when touched. Dressing procedure/placement/frequency: Right great toe hangs loosly downward from the joint; pt could benefit from an X-ray to assess site. Pt was previously using Silvadene according to EMR, but wound is moist at this time.  Will use Aquacel to absorb drainage and provide antimicrobial benefits. Applied a small splint to hold toe in alignment, padded with cotton to decrease pressure against the skin.  Pt can resume follow-up with podiatrist after discharge. Please re-consult if further assistance is needed.  Thank-you,  Cammie Mcgeeawn Gamble Enderle MSN, RN, CWOCN, WilliamsonWCN-AP, CNS [phone]

## 2014-10-17 NOTE — Care Management Note (Signed)
Case Management Note  Patient Details  Name: Carola Rhineerry L Bozarth MRN: [id number] Date of Birth: 16-Sep-1960  Subjective/Objective:                 CM following for progression and d/c planning.   Action/Plan: Pt is resident of [facility] on [address], [place], [place]. Ph [phone]. Per pt brother the family wishes for the pt to return to that facility. This pt sister, normally assist with his care , however she is with her husband who is currently at a residential hospice facility and terminal. Therefore the pt brother will be available to assist with issues of care for this pt . Per pt brother, he is available if needed. CSW notified of family dynamics.   Expected Discharge Date:                  Expected Discharge Plan:  Group Home  In-House Referral:  Clinical Social Work  Discharge planning Services  CM Consult  Post Acute Care Choice:    Choice offered to:  NA  DME Arranged:    DME Agency:     HH Arranged:    HH Agency:     Status of Service:     Medicare Important Message Given:    Date Medicare IM Given:    Medicare IM give by:    Date Additional Medicare IM Given:    Additional Medicare Important Message give by:     If discussed at Long Length of Stay Meetings, dates discussed:    Additional Comments:  Starlyn SkeansRoyal, Junetta Hearn U, RN 10/17/2014, 10:57 AM

## 2014-10-18 ENCOUNTER — Ambulatory Visit (HOSPITAL_COMMUNITY): Payer: Medicare Other

## 2014-10-18 DIAGNOSIS — R05 Cough: Secondary | ICD-10-CM

## 2014-10-18 DIAGNOSIS — M79674 Pain in right toe(s): Secondary | ICD-10-CM | POA: Insufficient documentation

## 2014-10-18 DIAGNOSIS — L89899 Pressure ulcer of other site, unspecified stage: Secondary | ICD-10-CM

## 2014-10-18 LAB — BASIC METABOLIC PANEL
Anion gap: 10 (ref 5–15)
BUN: <5 mg/dL — ABNORMAL LOW (ref 6–20)
CALCIUM: 8.5 mg/dL — AB (ref 8.9–10.3)
CO2: 26 mmol/L (ref 22–32)
CREATININE: 0.56 mg/dL — AB (ref 0.61–1.24)
Chloride: 102 mmol/L (ref 101–111)
GFR calc Af Amer: >60 mL/min (ref >60)
GFR calc non Af Amer: >60 mL/min (ref >60)
Glucose, Bld: 126 mg/dL — ABNORMAL HIGH (ref 65–99)
Potassium: 4 mmol/L (ref 3.5–5.1)
Sodium: 138 mmol/L (ref 135–145)

## 2014-10-18 LAB — CBC
HCT: 34.8 % — ABNORMAL LOW (ref 39.0–52.0)
HEMOGLOBIN: 11.6 g/dL — AB (ref 13.0–17.0)
MCH: 26.2 pg (ref 26.0–34.0)
MCHC: 33.3 g/dL (ref 30.0–36.0)
MCV: 78.7 fL (ref 78.0–100.0)
Platelets: 263 10*3/uL (ref 150–400)
RBC: 4.42 MIL/uL (ref 4.22–5.81)
RDW: 15.5 % (ref 11.5–15.5)
WBC: 7.5 10*3/uL (ref 4.0–10.5)

## 2014-10-18 LAB — LACTIC ACID, PLASMA: Lactic Acid, Venous: 1.3 mmol/L (ref 0.5–2.0)

## 2014-10-18 LAB — C-REACTIVE PROTEIN: CRP: 18.4 mg/dL — AB (ref <1.0)

## 2014-10-18 LAB — TROPONIN I

## 2014-10-18 MED ORDER — AMOXICILLIN-POT CLAVULANATE 250-125 MG PO TABS
1.0000 | ORAL_TABLET | Freq: Three times a day (TID) | ORAL | Status: DC
  Administered 2014-10-18 – 2014-10-20 (×6): 1 via ORAL
  Filled 2014-10-18 (×9): qty 1

## 2014-10-18 MED ORDER — POLYETHYLENE GLYCOL 3350 17 G PO PACK
17.0000 g | PACK | Freq: Two times a day (BID) | ORAL | Status: DC
  Administered 2014-10-18 – 2014-10-19 (×4): 17 g via ORAL
  Filled 2014-10-18 (×7): qty 1

## 2014-10-18 MED ORDER — SENNOSIDES-DOCUSATE SODIUM 8.6-50 MG PO TABS
1.0000 | ORAL_TABLET | Freq: Two times a day (BID) | ORAL | Status: DC
  Administered 2014-10-18 – 2014-10-20 (×5): 1 via ORAL
  Filled 2014-10-18 (×6): qty 1

## 2014-10-18 MED ORDER — MUPIROCIN CALCIUM 2 % EX CREA
TOPICAL_CREAM | Freq: Two times a day (BID) | CUTANEOUS | Status: DC
  Administered 2014-10-18 – 2014-10-19 (×3): via TOPICAL
  Filled 2014-10-18: qty 15

## 2014-10-18 NOTE — Clinical Social Work Note (Signed)
Clinical Social Work Assessment  Patient Details  Name: [name] MRN: [id number] Date of Birth: 04/21/1961  Date of referral:  10/18/14               Reason for consult:   (Return back group home ([facility] [place]))              Housing/Transportation Living arrangements for the past 2 months:  Group Home Source of Information:  Facility ([name] [phone]) Patient Interpreter Needed:  None Criminal Activity/Legal Involvement Pertinent to Current Situation/Hospitalization:  No - Comment as needed Significant Relationships:  Siblings Lives with:   (Group Home) Do you feel safe going back to the place where you live?  Yes Need for family participation in patient care:  No (Coment)  Care giving concerns:  [name] did not expressed concerns.    Social Worker assessment / plan:  CSW spoke with the pt's caregiver at the group home. CSW introduced self and purpose of the call. [name] reported that she has been providing care for the pt for 17 years. [name] reported the pt's has been a resident since [id number]. [name] reported that the pt uses a power wheelchair and a shower chair at home. [name] is working with [employer] known [employer] to get the pt hoyer lift. [name] confirmed that the pt can return back. CSW will continue to follow this pt and assist with discharge as needed.   Employment status:  Disabled (Comment on whether or not currently receiving Disability) Insurance information:  Medicare, [place]  Patient/Family's Response to care: [name] reported the care in which the pt has received has been great.   Patient/Family's Understanding of and Emotional Response to Diagnosis, Current Treatment, and Prognosis: [name] reported that given the pt's diagnoses it is common for he to be admitted to the hospital. [name] reported that her and her staff do their best to care for the pt at home and make sure he attends all doctor appointments.     Emotional  Assessment Appearance:  Unable to Assess Attitude/Demeanor/Rapport:  Unable to Assess Affect (typically observed):  Unable to Assess Orientation:  Oriented to Place, Oriented to Self Alcohol / Substance use:  Not Applicable Psych involvement (Current and /or in the community):  No (Comment)  Discharge Needs  Concerns to be addressed:  Denies Needs/Concerns at this time Readmission within the last 30 days:  No Current discharge risk:  None Barriers to Discharge:  No Barriers Identified   [name], MSW, LCSWA [phone]

## 2014-10-18 NOTE — Progress Notes (Signed)
Orthopedic Tech Progress Note Patient Details:  [name] 1961-02-29 [id number]  Ortho Devices Type of Ortho Device: Postop shoe/boot Ortho Device/Splint Location: (B) prafo boots Ortho Device/Splint Interventions: Ordered, Application   [name], [name] 10/18/2014, 10:35 PM

## 2014-10-18 NOTE — Progress Notes (Signed)
VASCULAR LAB PRELIMINARY  ARTERIAL  ABI completed:    RIGHT    LEFT    PRESSURE WAVEFORM  PRESSURE WAVEFORM  BRACHIAL 119 Triphasic BRACHIAL 120 Triphasic  DP 137 Triphasic DP 138 Triphasic  PT 134 Triphasic PT 139 Triphasic    RIGHT LEFT  ABI 1.14 1.16   Bilateral- ABIs and Doppler waveforms indicate normal arterial flow at rest  Arrian Manson, RVS 10/18/2014, 11:48 AM

## 2014-10-18 NOTE — Progress Notes (Signed)
Patient ID: [name], male   DOB: 09/06/1960, 54 y.o.   MRN: [id number]     [address] [place], [place] [address]    Phone: [phone] FAX: [phone]     Subjective: WBC normal.  Afebrile.  Tolerating fulls.  No n/v.  No flatus.  Passing flatus.    Objective:  Vital signs:  Filed Vitals:   10/17/14 1100 10/17/14 1744 10/17/14 2131 10/18/14 0500  BP: 134/85 132/77 127/73 98/60  Pulse: 95 94 90 85  Temp: 97.7 F (36.5 C) 98.3 F (36.8 C) 98.1 F (36.7 C) 97.6 F (36.4 C)  TempSrc: Oral Oral Oral Oral  Resp: 20 20 18 16   Height:      Weight:      SpO2: 96% 96% 97% 92%    Last BM Date: 10/16/14  Intake/Output   Yesterday:  05/23 0701 - 05/24 0700 In: 323 [P.O.:320; I.V.:3] Out: 800 [Urine:800] This shift:    I/O last 3 completed shifts: In: 1824.7 [P.O.:780; I.V.:1044.7] Out: 800 [Urine:800]   Physical Exam: General: Pt awake/alert/oriented x4 in no acute distress Abdomen: Soft.  Nondistended.  Mild ttp left mid to lower quadrant.  No evidence of peritonitis.  No incarcerated hernias.   Problem List:   Active Problems:   Mental retardation   Congenital quadriplegia   Colitis   Pancolitis   Abdominal distension   Abdominal pain    Results:   Labs: Results for orders placed or performed during the hospital encounter of 10/15/14 (from the past 48 hour(s))  CBC     Status: Abnormal   Collection Time: 10/17/14 11:32 AM  Result Value Ref Range   WBC 9.2 4.0 - 10.5 K/uL   RBC 4.47 4.22 - 5.81 MIL/uL   Hemoglobin 11.5 (L) 13.0 - 17.0 g/dL   HCT 35.3 (L) 39.0 - 52.0 %   MCV 79.0 78.0 - 100.0 fL   MCH 25.7 (L) 26.0 - 34.0 pg   MCHC 32.6 30.0 - 36.0 g/dL   RDW 15.4 11.5 - 15.5 %   Platelets 288 150 - 400 K/uL  Basic metabolic panel     Status: Abnormal   Collection Time: 10/17/14 11:32 AM  Result Value Ref Range   Sodium 134 (L) 135 - 145 mmol/L   Potassium 4.0 3.5 - 5.1  mmol/L   Chloride 100 (L) 101 - 111 mmol/L   CO2 23 22 - 32 mmol/L   Glucose, Bld 119 (H) 65 - 99 mg/dL   BUN 5 (L) 6 - 20 mg/dL   Creatinine, Ser 0.48 (L) 0.61 - 1.24 mg/dL   Calcium 8.4 (L) 8.9 - 10.3 mg/dL   GFR calc non Af Amer >60 >60 mL/min   GFR calc Af Amer >60 >60 mL/min    Comment: (NOTE) The eGFR has been calculated using the CKD EPI equation. This calculation has not been validated in all clinical situations. eGFR's persistently <60 mL/min signify possible Chronic Kidney Disease.    Anion gap 11 5 - 15  Troponin I (q 6hr x 3)     Status: None   Collection Time: 10/17/14  6:28 PM  Result Value Ref Range   Troponin I <0.03 <0.031 ng/mL    Comment:        NO INDICATION OF MYOCARDIAL INJURY.   Lactic acid, plasma     Status: None   Collection Time: 10/17/14 10:20 PM  Result Value Ref  Range   Lactic Acid, Venous 1.4 0.5 - 2.0 mmol/L  Troponin I (q 6hr x 3)     Status: None   Collection Time: 10/18/14 12:33 AM  Result Value Ref Range   Troponin I <0.03 <0.031 ng/mL    Comment:        NO INDICATION OF MYOCARDIAL INJURY.   Lactic acid, plasma     Status: None   Collection Time: 10/18/14 12:33 AM  Result Value Ref Range   Lactic Acid, Venous 1.3 0.5 - 2.0 mmol/L  CBC     Status: Abnormal   Collection Time: 10/18/14  7:39 AM  Result Value Ref Range   WBC 7.5 4.0 - 10.5 K/uL   RBC 4.42 4.22 - 5.81 MIL/uL   Hemoglobin 11.6 (L) 13.0 - 17.0 g/dL   HCT 34.8 (L) 39.0 - 52.0 %   MCV 78.7 78.0 - 100.0 fL   MCH 26.2 26.0 - 34.0 pg   MCHC 33.3 30.0 - 36.0 g/dL   RDW 15.5 11.5 - 15.5 %   Platelets 263 150 - 400 K/uL  Basic metabolic panel     Status: Abnormal   Collection Time: 10/18/14  7:39 AM  Result Value Ref Range   Sodium 138 135 - 145 mmol/L   Potassium 4.0 3.5 - 5.1 mmol/L   Chloride 102 101 - 111 mmol/L   CO2 26 22 - 32 mmol/L   Glucose, Bld 126 (H) 65 - 99 mg/dL   BUN <5 (L) 6 - 20 mg/dL   Creatinine, Ser 0.56 (L) 0.61 - 1.24 mg/dL   Calcium 8.5 (L) 8.9  - 10.3 mg/dL   GFR calc non Af Amer >60 >60 mL/min   GFR calc Af Amer >60 >60 mL/min    Comment: (NOTE) The eGFR has been calculated using the CKD EPI equation. This calculation has not been validated in all clinical situations. eGFR's persistently <60 mL/min signify possible Chronic Kidney Disease.    Anion gap 10 5 - 15    Imaging / Studies: Dg Abd 1 View  10/17/2014   CLINICAL DATA:  Abdominal pain  EXAM: ABDOMEN - 1 VIEW  COMPARISON:  CT 10/15/2014  FINDINGS: Mottling of the bowel gas pattern particularly over the right mid abdomen is most compatible with stool/bowel content as seen previously. Pneumatosis could appear similar and could be evaluated at decubitus or upright imaging as clinically indicated. Moderate thumbprinting compatible with previously seen mural edema is reidentified over the left mid abdomen. Presence or absence of air-fluid levels or free air is suboptimally evaluated on this supine projection. Clips project over the epigastrium. No acute osseous abnormality.  IMPRESSION: Left mid abdominal colonic wall thickening compatible previously seen edema/colitis. No new acute abnormality.   Electronically Signed   By: Conchita Paris M.D.   On: 10/17/2014 11:14   Dg Abd 2 Views  10/17/2014   CLINICAL DATA:  Abdominal pain for 3 days.  Initial encounter.  EXAM: ABDOMEN - 2 VIEW  COMPARISON:  Radiographs 10/17/2014 and CT 10/15/2014.  FINDINGS: No definite pneumatosis or extraluminal air identified on these views. Colonic wall thickening and prominent stool in the right colon are again noted. Surgical clips are present within the upper abdomen. Known large hiatal hernia not well visualized.  IMPRESSION: No acute findings. No radiographic evidence of bowel perforation or definite pneumatosis.   Electronically Signed   By: Richardean Sale M.D.   On: 10/17/2014 18:14   Dg Toe Great Right  10/17/2014   CLINICAL  DATA:  Right great toe pain for 3 days.  Initial encounter.  EXAM: RIGHT  GREAT TOE  COMPARISON:  11/02/2013 radiographs.  FINDINGS: Postsurgical changes status post arthrodesis at the first metatarsal phalangeal joint are again noted. Dorsal plate and screws are intact without evidence of loosening. There is progressive widening of the interphalangeal joint with apparent lysis of the proximal phalangeal head. The bones are demineralized. There is no evidence of acute fracture or dislocation.  IMPRESSION: Interval widening of the interphalangeal joint of the great toe with apparent lysis of the proximal phalangeal head. In the absence of interval surgery, this is suspicious for a septic joint and possible osteomyelitis. Correlate clinically.   Electronically Signed   By: Richardean Sale M.D.   On: 10/17/2014 18:17    Medications / Allergies:  Scheduled Meds: . baclofen  20 mg Oral QID  . divalproex  500 mg Oral Q12H  . heparin  5,000 Units Subcutaneous 3 times per day  . levofloxacin  750 mg Oral Daily  . metroNIDAZOLE  500 mg Oral 3 times per day  . pantoprazole sodium  40 mg Oral Daily  . risperiDONE  2 mg Oral QHS  . sodium chloride  3 mL Intravenous Q12H  . tiZANidine  2 mg Oral TID   Continuous Infusions:  PRN Meds:.sodium chloride, acetaminophen **OR** acetaminophen, LORazepam, ondansetron **OR** ondansetron (ZOFRAN) IV, sodium chloride  Antibiotics: Anti-infectives    Start     Dose/Rate Route Frequency Ordered Stop   10/16/14 1400  metroNIDAZOLE (FLAGYL) tablet 500 mg     500 mg Oral 3 times per day 10/16/14 1036     10/16/14 1130  levofloxacin (LEVAQUIN) tablet 750 mg     750 mg Oral Daily 10/16/14 1036     10/15/14 2145  ciprofloxacin (CIPRO) IVPB 400 mg     400 mg 200 mL/hr over 60 Minutes Intravenous  Once 10/15/14 2142 10/15/14 2306   10/15/14 2145  metroNIDAZOLE (FLAGYL) tablet 500 mg     500 mg Oral STAT 10/15/14 2142 10/15/14 2154        Assessment/Plan pancolitis-non surgical abdomen.  May advance diet, but would allow for low fiber for  some time.  May give miralax.  Please call [facility] should any surgical problems arise.  I spoke with the patient and the group home supervisor.    Erby Pian, [facility] Surgery Pager [phone]) For consults and floor pages call [phone]([id number])  10/18/2014 9:28 AM

## 2014-10-18 NOTE — Consult Note (Signed)
Reason for Consult: Ulceration osteomyelitis right great toe IP joint Referring Physician: Dr Renette Butters is an 54 y.o. male.  HPI: Patient is a 54 year old gentleman with multiple medical problems including cerebral palsy with essentially paralysis involving his lower extremities. 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  Past Medical History  Diagnosis Date  . 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 CEREBRAL PALSY 07/24/2006  . HYPERLIPIDEMIA 07/02/2010  . GASTRIC ULCER ACUTE WITH HEMORRHAGE 07/24/2006  . 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 URINARY INCONTINENCE 02/09/2010  . History of hiatal hernia     Past Surgical History  Procedure Laterality Date  . Stomach surgery      as an infant "couldn't keep food down"    History reviewed. No pertinent family history.  Social History:  reports that he has never smoked. He has never used smokeless tobacco. He reports that he does not drink alcohol or use illicit drugs.  Allergies:  Allergies  Allergen Reactions  . Adhesive [Tape] Rash    Medications: I have reviewed the patient's current medications.  Results for orders placed or performed during the hospital encounter of 10/15/14 (from the past 48 hour(s))  CBC     Status: Abnormal   Collection Time: 10/17/14 11:32 AM  Result Value Ref Range   WBC 9.2 4.0 - 10.5 K/uL   RBC 4.47 4.22 - 5.81 MIL/uL   Hemoglobin 11.5 (L) 13.0 - 17.0 g/dL   HCT 35.3 (L) 39.0 - 52.0 %   MCV 79.0 78.0 - 100.0 fL   MCH 25.7 (L) 26.0 - 34.0 pg   MCHC 32.6 30.0 - 36.0 g/dL   RDW 15.4 11.5 - 15.5 %   Platelets 288 150 - 400 K/uL  Basic metabolic panel     Status: Abnormal   Collection Time: 10/17/14 11:32 AM  Result Value Ref Range   Sodium 134 (L) 135 - 145 mmol/L   Potassium 4.0 3.5 - 5.1 mmol/L   Chloride 100 (L) 101 - 111 mmol/L   CO2 23 22 - 32 mmol/L   Glucose, Bld 119 (H) 65 - 99 mg/dL   BUN 5 (L) 6 - 20 mg/dL   Creatinine, Ser 0.48 (L) 0.61 - 1.24 mg/dL   Calcium 8.4 (L) 8.9 - 10.3 mg/dL   GFR calc non Af Amer >60 >60 mL/min   GFR calc Af Amer >60 >60 mL/min    Comment: (NOTE) The eGFR has been calculated using the CKD EPI equation. This calculation has not been validated in all clinical situations. eGFR's persistently <60 mL/min signify possible Chronic Kidney Disease.    Anion gap 11 5 - 15  Troponin I (q 6hr x 3)     Status: None   Collection Time: 10/17/14  6:28 PM  Result Value Ref Range   Troponin I <0.03 <0.031 ng/mL    Comment:        NO INDICATION OF MYOCARDIAL INJURY.   Lactic acid, plasma     Status: None   Collection Time: 10/17/14 10:20 PM  Result Value Ref Range   Lactic Acid, Venous 1.4 0.5 - 2.0 mmol/L  Troponin I (q 6hr x 3)     Status: None   Collection Time: 10/18/14 12:33 AM  Result Value Ref Range   Troponin I <0.03 <0.031 ng/mL    Comment:        NO INDICATION OF MYOCARDIAL INJURY.   Lactic acid, plasma     Status:  None   Collection Time: 10/18/14 12:33 AM  Result Value Ref Range   Lactic Acid, Venous 1.3 0.5 - 2.0 mmol/L  CBC     Status: Abnormal   Collection Time: 10/18/14  7:39 AM  Result Value Ref Range   WBC 7.5 4.0 - 10.5 K/uL   RBC 4.42 4.22 - 5.81 MIL/uL   Hemoglobin 11.6 (L) 13.0 - 17.0 g/dL   HCT 34.8 (L) 39.0 - 52.0 %   MCV 78.7 78.0 - 100.0 fL   MCH 26.2 26.0 - 34.0 pg   MCHC 33.3 30.0 - 36.0 g/dL   RDW 15.5 11.5 - 15.5 %   Platelets 263 150 - 400 K/uL  Basic metabolic panel     Status: Abnormal   Collection Time: 10/18/14  7:39 AM  Result Value Ref Range   Sodium 138 135 - 145 mmol/L   Potassium 4.0 3.5 - 5.1 mmol/L   Chloride 102 101 - 111 mmol/L   CO2 26 22 - 32 mmol/L   Glucose, Bld 126 (H) 65 - 99 mg/dL   BUN <5 (L) 6 - 20 mg/dL   Creatinine, Ser 0.56 (L) 0.61 - 1.24 mg/dL   Calcium 8.5 (L) 8.9 - 10.3 mg/dL   GFR calc non Af Amer >60 >60 mL/min   GFR calc Af Amer >60 >60 mL/min    Comment: (NOTE) The eGFR has been calculated using the CKD EPI equation. This calculation  has not been validated in all clinical situations. eGFR's persistently <60 mL/min signify possible Chronic Kidney Disease.    Anion gap 10 5 - 15  C-reactive protein     Status: Abnormal   Collection Time: 10/18/14  3:05 PM  Result Value Ref Range   CRP 18.4 (H) <1.0 mg/dL    Dg Abd 1 View  10/17/2014   CLINICAL DATA:  Abdominal pain  EXAM: ABDOMEN - 1 VIEW  COMPARISON:  CT 10/15/2014  FINDINGS: Mottling of the bowel gas pattern particularly over the right mid abdomen is most compatible with stool/bowel content as seen previously. Pneumatosis could appear similar and could be evaluated at decubitus or upright imaging as clinically indicated. Moderate thumbprinting compatible with previously seen mural edema is reidentified over the left mid abdomen. Presence or absence of air-fluid levels or free air is suboptimally evaluated on this supine projection. Clips project over the epigastrium. No acute osseous abnormality.  IMPRESSION: Left mid abdominal colonic wall thickening compatible previously seen edema/colitis. No new acute abnormality.   Electronically Signed   By: Conchita Paris M.D.   On: 10/17/2014 11:14   Dg Abd 2 Views  10/17/2014   CLINICAL DATA:  Abdominal pain for 3 days.  Initial encounter.  EXAM: ABDOMEN - 2 VIEW  COMPARISON:  Radiographs 10/17/2014 and CT 10/15/2014.  FINDINGS: No definite pneumatosis or extraluminal air identified on these views. Colonic wall thickening and prominent stool in the right colon are again noted. Surgical clips are present within the upper abdomen. Known large hiatal hernia not well visualized.  IMPRESSION: No acute findings. No radiographic evidence of bowel perforation or definite pneumatosis.   Electronically Signed   By: Richardean Sale M.D.   On: 10/17/2014 18:14   Dg Toe Great Right  10/17/2014   CLINICAL DATA:  Right great toe pain for 3 days.  Initial encounter.  EXAM: RIGHT GREAT TOE  COMPARISON:  11/02/2013 radiographs.  FINDINGS: Postsurgical  changes status post arthrodesis at the first metatarsal phalangeal joint are again noted. Dorsal plate and screws  are intact without evidence of loosening. There is progressive widening of the interphalangeal joint with apparent lysis of the proximal phalangeal head. The bones are demineralized. There is no evidence of acute fracture or dislocation.  IMPRESSION: Interval widening of the interphalangeal joint of the great toe with apparent lysis of the proximal phalangeal head. In the absence of interval surgery, this is suspicious for a septic joint and possible osteomyelitis. Correlate clinically.   Electronically Signed   By: Richardean Sale M.D.   On: 10/17/2014 18:17    Review of Systems  All other systems reviewed and are negative.  Blood pressure 136/65, pulse 95, temperature 97.9 F (36.6 C), temperature source Oral, resp. rate 16, height 6' (1.829 m), weight 83.6 kg (184 lb 4.9 oz), SpO2 94 %. Physical Exam On examination patient has good arterial circulation to the right foot. There is no cellulitis no adenopathy no foul odor no purulent drainage. He does have exposed IP joint dorsally. 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  Plan: Feel the patient may benefit from protective foam boots to prevent motion to the great toe. Feel that daily dressing changes with antibiotically ointment would be appropriate at this time. Patient at this time is not interested in surgery. I can follow-up in the office.  Leeanna Slaby V 10/18/2014, 7:59 PM

## 2014-10-18 NOTE — Progress Notes (Signed)
Family Practice Teaching Service Interval Progress Note  [name] FlesherWent to check on patient. He was sleeping comfortably in bed when I entered the room. He notes his shoulder pain is better than previously though still mildly present. Denies abdominal discomfort at this time. Has yet to have a BM. Notes he is able to pass gas. Denies chest pain at this time, though notes it does hurt when he coughs. Lactic acid x2 normal over night. Troponin negative x2 over night. His abdomen is soft, NT, minimally distended, no guarding or rebound. Vitals remain in normal range, though BP and O2 sat in lower end of normal. Will continue to monitor.  [name] AlarEric Sonnenberg, MD Family Medicine PGY-3 Service Pager [phone]

## 2014-10-18 NOTE — Progress Notes (Signed)
Family Medicine Teaching Service Daily Progress Note Intern Pager: [phone]  Patient name: [name] Medical record number: [id number] Date of birth: 11-07-1960 Age: 54 y.o. Gender: male  Primary Care Provider: Maryjean Ka, MD Consultants: None Code Status: Full  Pt Overview and Major Events to Date:  5/22 - Admitted with pancolitis  Assessment and Plan: [name] is a 54 y.o. male presenting with abdominal pain and pancolitis. PMH is significant for cerebral palsy with mental handicap, HLD, urinary incontinence, acne vulgaris.  # Abdominal pain / pancolitis: x 1 day with some reported diarrhea. Has now transitioned to feeling the urge to stool, but being unable to. Is passing flatus per pt. Hyperactive BS. Vitals stable, afebrile. CT abdomen showing diffuse colonic wall thickening,  cholelithiasis, large hiatal hernia.  - Liquid diet for now.  - s/p 1 dose cipro in ED 5/21 - Levaquin (5/22-5/24 )> Augmentin (5/24 - ), flagyl (5/22- ) likely needs 5 day course.  - Levaquin to Augmentin given prolonged Qtc.  - Tramadol prn pain.  - enteric precautions - Will give laxitive to help relieve constipation.  - Lactate negative.   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 Deep infection / ulceration noted with visible bone. XR with joint space widening and osteolysis. Unclear as to exactly what the inciting event is. Previous notes mention pressure ulceration. He has fairly good pulses in this extremity, though there is little hair. No DMII.  - ABI to evaluate vascular sufficiency.  - Was on Levaquin, but need to transition to Augmentin given QTc. Holding off on Vancomycin per discussion with Dr. Roda Shutters.  - Will give Dr. Lajoyce Corners a call and discuss with him.  - Afebrile, and WBC normal. Longstanding ulceration, but likely needs amputation.   # Chest pain. ACS ruled out. Chest pain improved.   - Repeat EKG with prolonged QTc otherwise no acute changes.  -  Troponins negative.   # CAP: Afebrile. CXR difficult to read with poor inspiration, but with new productive cough. WBC normal. CAP coverage initially levaquin transitioned to Augmentin - Transitioned to Augmentin 250mg  TID.  - supplemental o2 to keep sat >92%, wean as tolerated  - Incentive Spirometry.   # Cerebral palsy - continue home muscle relaxant regimen of baclofen+tizanidine - continue home lorazepam - continue home depakote 500mg  BID - continue home risperdal  FEN/GI: liquid diet / saline lock Prophylaxis: heparin sq  Disposition: Pending improvement in constipation and discussion with Orthopedics.   Subjective:  Complains of abdominal pain with coughing. Complaining of constipation this am. Passing flatus. He has not had any more diarrhea rather constipation. He has a cough which is productive. No nausea or vomiting.   Objective: Temp:  [97.6 F (36.4 C)-98.3 F (36.8 C)] 97.6 F (36.4 C) (05/24 0500) Pulse Rate:  [85-95] 85 (05/24 0500) Resp:  [16-20] 16 (05/24 0500) BP: (98-134)/(60-85) 98/60 mmHg (05/24 0500) SpO2:  [92 %-97 %] 92 % (05/24 0500) Physical Exam: Gen: NAD, AAOx3 HEENT: NCAT, PERRLA, O/P Clear Neck: FROM, Supple CV: RRR, No MGR, normal S1/S2 Resp: Crackles in bilateral bases. Appropriate rate unlabored, decent air movement.  Abd: S, Distended, diffusely tender, but not peritoneal signs. Hyperactive bowel sounds, no palpable stool mass. No organomegaly.  Ext: WWP, 2+ distal pulses on the right and left, hairless lower extremities. Warm. Right great toe with necrotic / open ulceration no evidence of cellulitis. No edema.  Neuro: AAOx3, no focal deficits.  Skin: no rashes, lesions  as above.   Laboratory/Imaging/Diagnostic Tests: EKG: NSR, new TWI in V1, poor R wave progression (seen on previous EKGs)  Results for orders placed or performed during the hospital encounter of 10/15/14 (from the past 24 hour(s))  Troponin I (q 6hr x 3)     Status: None    Collection Time: 10/17/14  6:28 PM  Result Value Ref Range   Troponin I <0.03 <0.031 ng/mL  Lactic acid, plasma     Status: None   Collection Time: 10/17/14 10:20 PM  Result Value Ref Range   Lactic Acid, Venous 1.4 0.5 - 2.0 mmol/L  Troponin I (q 6hr x 3)     Status: None   Collection Time: 10/18/14 12:33 AM  Result Value Ref Range   Troponin I <0.03 <0.031 ng/mL  Lactic acid, plasma     Status: None   Collection Time: 10/18/14 12:33 AM  Result Value Ref Range   Lactic Acid, Venous 1.3 0.5 - 2.0 mmol/L  CBC     Status: Abnormal   Collection Time: 10/18/14  7:39 AM  Result Value Ref Range   WBC 7.5 4.0 - 10.5 K/uL   RBC 4.42 4.22 - 5.81 MIL/uL   Hemoglobin 11.6 (L) 13.0 - 17.0 g/dL   HCT 40.9 (L) 81.1 - 91.4 %   MCV 78.7 78.0 - 100.0 fL   MCH 26.2 26.0 - 34.0 pg   MCHC 33.3 30.0 - 36.0 g/dL   RDW 78.2 95.6 - 21.3 %   Platelets 263 150 - 400 K/uL  Basic metabolic panel     Status: Abnormal   Collection Time: 10/18/14  7:39 AM  Result Value Ref Range   Sodium 138 135 - 145 mmol/L   Potassium 4.0 3.5 - 5.1 mmol/L   Chloride 102 101 - 111 mmol/L   CO2 26 22 - 32 mmol/L   Glucose, Bld 126 (H) 65 - 99 mg/dL   BUN <5 (L) 6 - 20 mg/dL   Creatinine, Ser 0.86 (L) 0.61 - 1.24 mg/dL   Calcium 8.5 (L) 8.9 - 10.3 mg/dL   GFR calc non Af Amer >60 >60 mL/min   GFR calc Af Amer >60 >60 mL/min   Anion gap 10 5 - 15   CLINICAL DATA: Abdominal pain for 3 days. Initial encounter.  EXAM: ABDOMEN - 2 VIEW  COMPARISON: Radiographs 10/17/2014 and CT 10/15/2014.  FINDINGS: No definite pneumatosis or extraluminal air identified on these views. Colonic wall thickening and prominent stool in the right colon are again noted. Surgical clips are present within the upper abdomen. Known large hiatal hernia not well visualized.  IMPRESSION: No acute findings. No radiographic evidence of bowel perforation or definite pneumatosis.   CLINICAL DATA: Right great toe pain for 3 days.  Initial encounter.  EXAM: RIGHT GREAT TOE  COMPARISON: 11/02/2013 radiographs.  FINDINGS: Postsurgical changes status post arthrodesis at the first metatarsal phalangeal joint are again noted. Dorsal plate and screws are intact without evidence of loosening. There is progressive widening of the interphalangeal joint with apparent lysis of the proximal phalangeal head. The bones are demineralized. There is no evidence of acute fracture or dislocation.  IMPRESSION: Interval widening of the interphalangeal joint of the great toe with apparent lysis of the proximal phalangeal head. In the absence of interval surgery, this is suspicious for a septic joint and possible osteomyelitis. Correlate clinically.   Electronically Signed  By: Carey Bullocks M.D.  On: 10/17/2014 18:14   Yolande Jolly, MD 10/18/2014, 9:45 AM PGY-1,  [facility] Family Medicine FPTS Intern pager: [phone], text pages welcome

## 2014-10-19 DIAGNOSIS — M8660 Other chronic osteomyelitis, unspecified site: Secondary | ICD-10-CM

## 2014-10-19 LAB — CBC
HCT: 37.2 % — ABNORMAL LOW (ref 39.0–52.0)
Hemoglobin: 11.8 g/dL — ABNORMAL LOW (ref 13.0–17.0)
MCH: 25.2 pg — ABNORMAL LOW (ref 26.0–34.0)
MCHC: 31.7 g/dL (ref 30.0–36.0)
MCV: 79.5 fL (ref 78.0–100.0)
Platelets: 391 10*3/uL (ref 150–400)
RBC: 4.68 MIL/uL (ref 4.22–5.81)
RDW: 15.8 % — AB (ref 11.5–15.5)
WBC: 8.2 10*3/uL (ref 4.0–10.5)

## 2014-10-19 LAB — BASIC METABOLIC PANEL
ANION GAP: 9 (ref 5–15)
BUN: 7 mg/dL (ref 6–20)
CALCIUM: 8.8 mg/dL — AB (ref 8.9–10.3)
CO2: 25 mmol/L (ref 22–32)
Chloride: 104 mmol/L (ref 101–111)
Creatinine, Ser: 0.59 mg/dL — ABNORMAL LOW (ref 0.61–1.24)
GFR calc non Af Amer: >60 mL/min (ref >60)
GLUCOSE: 113 mg/dL — AB (ref 65–99)
POTASSIUM: 4.2 mmol/L (ref 3.5–5.1)
Sodium: 138 mmol/L (ref 135–145)

## 2014-10-19 LAB — IRON AND TIBC
Iron: 46 ug/dL (ref 45–182)
SATURATION RATIOS: 17 % — AB (ref 17.9–39.5)
TIBC: 267 ug/dL (ref 250–450)
UIBC: 221 ug/dL

## 2014-10-19 LAB — RETICULOCYTES
RBC.: 4.52 MIL/uL (ref 4.22–5.81)
Retic Count, Absolute: 72.3 10*3/uL (ref 19.0–186.0)
Retic Ct Pct: 1.6 % (ref 0.4–3.1)

## 2014-10-19 LAB — FERRITIN: Ferritin: 61 ng/mL (ref 24–336)

## 2014-10-19 NOTE — Consult Note (Addendum)
WOC follow-up: Pt now followed by Dr Lajoyce Cornersuda of ortho service for assessment and plan of care for right great toe. Please re-consult if further assistance is needed.  Thank-you,  Cammie Mcgeeawn Ellaree Gear MSN, RN, CWOCN, HomerWCN-AP, CNS [phone]

## 2014-10-19 NOTE — Progress Notes (Signed)
GPCs in cluster in 1/2 blood culture. Most likely contaminant. Well appearing, normal vitals. Will wait for speciation.  [name] M. Jimmey RalphParker, MD [facility] Resident PGY-1 10/19/2014 12:24 PM

## 2014-10-19 NOTE — Progress Notes (Signed)
OT Cancellation Note  Patient Details Name: [name] MRN: [id number] DOB: 1960-10-06   Cancelled Treatment:    Reason Eval/Treat Not Completed: OT screened, no needs identified, will sign off Spoke with PT. Pt dependent with ADL's . Will sign off  Noell Shular, [place] KungLorraine D 10/19/2014, 12:36 PM

## 2014-10-19 NOTE — Progress Notes (Signed)
CSW received consult that patient would likely be ready for DC on 5/26 back to Group home.  CSW called pt care taker at the group home Tresa Endo(Kelly [phone]) to inform of DC plan.  Group home can pick up patient at 2pm.    CSW informed Dr of anticipated patient pick up time.  CSW will continue to follow.  Merlyn LotJenna Holoman, LCSWA Clinical Social Worker [phone]

## 2014-10-19 NOTE — Progress Notes (Signed)
Family Medicine Teaching Service Daily Progress Note Intern Pager: [phone]  Patient name: [name] Medical record number: [id number] Date of birth: [birth year] Age: 54 y.o. Gender: male  Primary Care Provider: Maryjean Ka, MD Consultants: None Code Status: Full  Pt Overview and Major Events to Date:  5/22 - Admitted with pancolitis  Assessment and Plan: [name] is a 54 y.o. male presenting with abdominal pain and pancolitis. PMH is significant for cerebral palsy with mental handicap, HLD, urinary incontinence, acne vulgaris.  # Abdominal pain / pancolitis: Improving with restricted diet, laxatives, and antibiotics. Afebrile, Hyperactive BS continue. CT abdomen showing diffuse colonic wall thickening,  cholelithiasis, large hiatal hernia. Lactate normal. WBC - 8.2 - Advance to full liquids today.  - Blood cx NGTD - Levaquin (5/22-5/24 )> Augmentin (5/24 - 5/29 ), flagyl (5/22- 5/29 ) 7 day course.  - Levaquin to Augmentin given prolonged Qtc.  - Tramadol prn pain.  - enteric precautions - Continue miralax / senna - s .   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 Deep infection / ulceration noted with visible bone. XR with joint space widening and osteolysis. Unclear as to exactly what the inciting event is. Previous notes mention pressure ulceration. He has fairly good pulses in this extremity, though there is little hair. No DMII. CRP - 18.4 - ABI > 1 and triphasic bilaterally.  - Augmentin and flagyl currently for pneumonia / colitis. No evidence of cellulitis / drainage from the ulceration area, though the chance of osteo is very high with him.  - Dr. Lajoyce Corners evaluated him yesterday and ordered a boot for both of his feet as well as plan for follow up in his office in 2 weeks with antibiotic ointment to be placed on his wound.  - Afebrile, and WBC normal. Longstanding ulceration. Will eventually need amputation. He says that his sister will  need to make this decision.   # CAP: Afebrile. CXR difficult to read with poor inspiration, but with new productive cough. WBC normal. CAP coverage initially levaquin transitioned to Augmentin - Transitioned to Augmentin  TID.  - supplemental o2 to keep sat >92%, wean as tolerated  - Incentive Spirometry.   # Anemia - Likely iron deficiency, though could also be a result of chronic ulceration / inflammation. ACD. Hgb is 11.8. MCV is 79.5 borderline normal.  - Due for colonoscopy. Addressed in 06/2014 in the office.  - Iron panel.  - Will need close follow up to make sure he is scheduled for c-scope.  - No FOBT given colitis and likely false positive.   # Chest pain. ACS ruled out. Chest pain improved.   - Repeat EKG with prolonged QTc otherwise no acute changes.  - Troponins negative.   # Cerebral palsy - continue home muscle relaxant regimen of baclofen+tizanidine - continue home lorazepam - continue home depakote  BID - continue home risperdal  FEN/GI: liquid diet / saline lock Prophylaxis: heparin sq  Disposition: Pending tolerance of diet and continued improvement home soon.   Subjective:  Abdominal pain is improved. He has had two bowel movements since yesterday. No nausea, vomiting. HE is tolerating liquids, but would like more of a diet. He says that his cough is somewhat better. He does say that his left shoulder pain is back. He says that his foot feels about the same as it always does. Additionally, he says that his sister makes medical decisions for him, and that she would need  to make any decisions surrounding amputation of his toe etc. He says overall that he feels better.   Objective: Temp:  [97.9 F (36.6 C)-98.9 F (37.2 C)] 98.9 F (37.2 C) (05/25 0537) Pulse Rate:  [79-95] 83 (05/25 0537) Resp:  [16-20] 20 (05/25 0537) BP: (95-136)/(52-65) 106/63 mmHg (05/25 0537) SpO2:  [93 %-95 %] 95 % (05/25 0537) Weight:  [180 lb (81.647 kg)] 180 lb (81.647 kg)  (05/24 2100) Physical Exam: Gen: NAD, AAOx3 HEENT: NCAT Neck: FROM, Supple CV: RRR, No MGR, normal S1/S2 Resp: Crackles in bilateral bases and in the right mid- lung. Appropriate rate unlabored.  Abd: S, Mildly distended, nontender this am, Hyperactive bowel sounds continue, No organomegaly. No peritoneal signs. Improving from yesterday.  Ext: Boots in place bilaterally, WWP, 2+ distal pulses on the right and left, hairless lower extremities. Right great toe with necrotic / open ulceration no evidence of cellulitis, no drainage. No edema.  Neuro: AAOx3, no focal deficits.  Skin: no rashes  Laboratory/Imaging/Diagnostic Tests: EKG: NSR, new TWI in V1, poor R wave progression (seen on previous EKGs)  Results for orders placed or performed during the hospital encounter of 10/15/14 (from the past 24 hour(s))  C-reactive protein     Status: Abnormal   Collection Time: 10/18/14  3:05 PM  Result Value Ref Range   CRP 18.4 (H) <1.0 mg/dL  CBC     Status: Abnormal   Collection Time: 10/19/14  6:32 AM  Result Value Ref Range   WBC 8.2 4.0 - 10.5 K/uL   RBC 4.68 4.22 - 5.81 MIL/uL   Hemoglobin 11.8 (L) 13.0 - 17.0 g/dL   HCT 47.837.2 (L) 29.539.0 - 62.152.0 %   MCV 79.5 78.0 - 100.0 fL   MCH 25.2 (L) 26.0 - 34.0 pg   MCHC 31.7 30.0 - 36.0 g/dL   RDW 30.815.8 (H) 65.711.5 - 84.615.5 %   Platelets 391 150 - 400 K/uL  Basic metabolic panel     Status: Abnormal   Collection Time: 10/19/14  6:32 AM  Result Value Ref Range   Sodium 138 135 - 145 mmol/L   Potassium 4.2 3.5 - 5.1 mmol/L   Chloride 104 101 - 111 mmol/L   CO2 25 22 - 32 mmol/L   Glucose, Bld 113 (H) 65 - 99 mg/dL   BUN 7 6 - 20 mg/dL   Creatinine, Ser 9.620.59 (L) 0.61 - 1.24 mg/dL   Calcium 8.8 (L) 8.9 - 10.3 mg/dL   GFR calc non Af Amer >60 >60 mL/min   GFR calc Af Amer >60 >60 mL/min   Anion gap 9 5 - 15   CRP - 18.4  CLINICAL DATA: Abdominal pain for 3 days. Initial encounter.  EXAM: ABDOMEN - 2 VIEW  COMPARISON: Radiographs 10/17/2014  and CT 10/15/2014.  FINDINGS: No definite pneumatosis or extraluminal air identified on these views. Colonic wall thickening and prominent stool in the right colon are again noted. Surgical clips are present within the upper abdomen. Known large hiatal hernia not well visualized.  IMPRESSION: No acute findings. No radiographic evidence of bowel perforation or definite pneumatosis.   CLINICAL DATA: Right great toe pain for 3 days. Initial encounter.  EXAM: RIGHT GREAT TOE  COMPARISON: 11/02/2013 radiographs.  FINDINGS: Postsurgical changes status post arthrodesis at the first metatarsal phalangeal joint are again noted. Dorsal plate and screws are intact without evidence of loosening. There is progressive widening of the interphalangeal joint with apparent lysis of the proximal phalangeal head. The bones are  demineralized. There is no evidence of acute fracture or dislocation.  IMPRESSION: Interval widening of the interphalangeal joint of the great toe with apparent lysis of the proximal phalangeal head. In the absence of interval surgery, this is suspicious for a septic joint and possible osteomyelitis. Correlate clinically.   Electronically Signed  By: Carey Bullocks M.D.  On: 10/17/2014 18:14   Yolande Jolly, MD 10/19/2014, 8:05 AM PGY-1, [place] FPTS Intern pager: [phone], text pages welcome

## 2014-10-19 NOTE — Evaluation (Signed)
Physical Therapy Evaluation Patient Details Name: [name] MRN: [id number] DOB: 1961/02/17 Today's Date: 10/19/2014   History of Present Illness  [name] is a 54 y.o. male presenting with abdominal pain and pancolitis. PMH is significant for cerebral palsy with mental handicap, HLD, urinary incontinence, acne vulgaris  Clinical Impression   Patient evaluated by Physical Therapy with no further acute PT needs identified. All education has been completed and the patient has no further questions. Larkin is from a Group Home and is dependent for mobility and ADLs;  See below for any follow-up Physical Therapy or equipment needs. PT is signing off. Thank you for this referral.     Follow Up Recommendations Supervision/Assistance - 24 hour;  HHPT for mobility and to help decr caregiver burden    Equipment Recommendations  Other (comment) Michiel Sites Lift)    Recommendations for Other Services       Precautions / Restrictions Precautions Precautions: Fall      Mobility  Bed Mobility Overal bed mobility: +2 for physical assistance;Needs Assistance Bed Mobility: Rolling;Supine to Sit Rolling: +2 for physical assistance;Total assist   Supine to sit: +2 for safety/equipment;Total assist     General bed mobility comments: Total assist and support for rolling and coming to sit EOB; Once up and sitting EOB, pt indicated it felt good to be upright; total assist to lay back down  Transfers                    Ambulation/Gait                Stairs            Wheelchair Mobility    Modified Rankin (Stroke Patients Only)       Balance Overall balance assessment: Needs assistance Sitting-balance support: Single extremity supported;Feet supported Sitting balance-Leahy Scale: Fair Sitting balance - Comments: initially needing supoort to sit up, but once stable, he was able to maintain sitting with LUE holding to rail, and seemed to enjoy being up Postural  control: Right lateral lean                                   Pertinent Vitals/Pain Pain Assessment: Faces Faces Pain Scale: Hurts even more Pain Location: no specific reports of pain, just grimace with moving Pain Descriptors / Indicators: Grimacing Pain Intervention(s): Repositioned;Monitored during session    Home Living Family/patient expects to be discharged to:: Group home                      Prior Function Level of Independence: Needs assistance   Gait / Transfers Assistance Needed: Pt slowly drives own power chair but not well per staff and pt.  Dependent on +2 total assist for transfers or a lift at group home.  ADL's / Homemaking Assistance Needed: Pt dependent for most adls.  Pt can feed self. Staff grooms, bathes, dresses, and assists with toileting with pt.        Hand Dominance   Dominant Hand: Left    Extremity/Trunk Assessment   Upper Extremity Assessment: Defer to OT evaluation (LUE slow moving, but functional; Able to use LUE to stabilize self sitting EOB)           Lower Extremity Assessment:  Paraplegic with CP; able to obtain good positioning with PRAFOs)      Cervical / Trunk Assessment:  (Extremely stiff  trunk)  Communication   Communication: No difficulties  Cognition Arousal/Alertness: Awake/alert Behavior During Therapy: WFL for tasks assessed/performed Overall Cognitive Status: Within Functional Limits for tasks assessed                      General Comments      Exercises        Assessment/Plan    PT Assessment Patent does not need any further PT services  PT Diagnosis Generalized weakness;Acute pain   PT Problem List    PT Treatment Interventions     PT Goals (Current goals can be found in the Care Plan section) Acute Rehab PT Goals Patient Stated Goal: Reports he would like his own lift PT Goal Formulation: All assessment and education complete, DC therapy    Frequency     Barriers to  discharge        Co-evaluation               End of Session Equipment Utilized During Treatment: Other (comment) (bed pad and +2 assist) Activity Tolerance: Patient tolerated treatment well Patient left: in bed;with call bell/phone within reach (bed facing window) Nurse Communication: Mobility status;Need for lift equipment         Time: 1610-9604 PT Time Calculation (min) (ACUTE ONLY): 16 min   Charges:   PT Evaluation $Initial PT Evaluation Tier I: 1 Procedure     PT G CodesVan Clines Hamff 10/19/2014, 12:05 PM  Van Clines, PT  Acute Rehabilitation Services Pager [phone] Office [phone]

## 2014-10-19 NOTE — Progress Notes (Signed)
CRITICAL VALUE ALERT  Critical value received:  Blood Cultures growing gram positive cocci in clusters   Date of notification:  10/19/2014  Time of notification:  1035  Critical value read back:Yes.    Nurse who received alert:  Marguerita Beardsamieko Hubbnard, BSN RN  MD notified (1st page): Jacquiline Doealeb Parker   Time of first page:  1110  MD notified (2nd page):  Time of second page:  Responding MD:  Jacquiline Doealeb Parker  Time MD responded:  [phone]

## 2014-10-20 LAB — BASIC METABOLIC PANEL
ANION GAP: 10 (ref 5–15)
BUN: 14 mg/dL (ref 6–20)
CO2: 24 mmol/L (ref 22–32)
CREATININE: 0.52 mg/dL — AB (ref 0.61–1.24)
Calcium: 8.5 mg/dL — ABNORMAL LOW (ref 8.9–10.3)
Chloride: 107 mmol/L (ref 101–111)
GFR calc non Af Amer: >60 mL/min (ref >60)
Glucose, Bld: 98 mg/dL (ref 65–99)
Potassium: 4 mmol/L (ref 3.5–5.1)
Sodium: 141 mmol/L (ref 135–145)

## 2014-10-20 LAB — CBC
HCT: 34.2 % — ABNORMAL LOW (ref 39.0–52.0)
Hemoglobin: 11 g/dL — ABNORMAL LOW (ref 13.0–17.0)
MCH: 25.6 pg — ABNORMAL LOW (ref 26.0–34.0)
MCHC: 32.2 g/dL (ref 30.0–36.0)
MCV: 79.7 fL (ref 78.0–100.0)
Platelets: 468 10*3/uL — ABNORMAL HIGH (ref 150–400)
RBC: 4.29 MIL/uL (ref 4.22–5.81)
RDW: 16.1 % — ABNORMAL HIGH (ref 11.5–15.5)
WBC: 8.7 10*3/uL (ref 4.0–10.5)

## 2014-10-20 LAB — CULTURE, BLOOD (ROUTINE X 2)

## 2014-10-20 MED ORDER — AMOXICILLIN-POT CLAVULANATE 250-125 MG PO TABS: 1.0000 | ORAL_TABLET | Freq: Three times a day (TID) | ORAL | Status: DC

## 2014-10-20 NOTE — Discharge Summary (Signed)
Family Medicine Teaching [facility] Discharge Summary  Patient name: [name] Medical record number: [id number] Date of birth: 1960-12-07 Age: 54 y.o. Gender: male Date of Admission: 10/15/2014  Date of Discharge: 10/20/2014 Admitting Physician: Doreene ElandKehinde T Eniola, MD  Primary Care Provider: Maryjean KaStreet, Christopher, MD Consultants: None  Indication for Hospitalization: Chest pain, Colitis  Discharge Diagnoses/Problem List:  Cerebral Palsy with mental handicap HLD Urinary Incontinence  Acne Vulgaris  Disposition: To Group Home  Discharge Condition: Stable.   Discharge Exam:  Gen: NAD, AAOx3 Neck: FROM, Supple CV: RRR, No MGR, normal S1/S2 Resp: Bilateral fine crackles improving, Appropriate rate, unlabored.  Abd: S, ND, NT, +BS not hyperactive any more, No organomegaly. Much improved.  Ext: Boots in place bilaterally, WWP, 2+ distal pulses on the right and left, hairless lower extremities. Right great toe with necrotic / open ulceration no evidence of cellulitis, no drainage. No edema.  Neuro: AAOx3, no focal deficits.  Skin: no rashes  Brief Hospital Course:  # Abdominal pain / pancolitis: Initially he presented with abdominal pain and mild diarrhea with CT scan showing diffuse colonic wall thickening / colitis. He then transitioned to some constipation and difficulty stooling. This Improving with restricted diet, laxatives, and antibiotics including augmenting and flagyl. He remained afebrile his abdominal pain improved and exam improved as well. Lactate was normal, his WBC was normal. He was found to be safe for discharge to his Group Home. He did have 1/2 blood cultures grow gram positive cocci in clusters. This was thought to be a contaminant given his clinical picture.   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 Deep infection / ulceration noted this hospitalization with visible bone. XR of his right toe showed joint space widening and  osteolysis. . Previous notes mention pressure ulceration as the cause. He has good vascular supply to his lower extremities. ABI this admission > 1 bilaterally and triphasic. His CRP - 18.4. Dr. Lajoyce Cornersuda saw him this admission and recommended boot to both lower extremities, Antibiotic ointment, and follow up with him in the office.   # CAP: Afebrile. He did have a cough on admission that was somewhat productive of sputum. His CXR is difficult to read due to poor inspiration. He had crackles to lung exam. He was started on Levaquin and Flagyl for pneumonia / enteritis, but was found to have prolonged QTC and transitioned to Augmentin for pneumonia coverage. He will continue this for a total 7 day course. He was never hypoxic and his vital signs have been stable.   # Anemia - May be a combination of iron deficiency and a result of chronic ulceration / inflammation. Iron panel with TIBC low normal 267 and Ferritin 61. Hgb is 11.8. MCV is 79.5 borderline normal. He does need a screening colonoscopy.   # Chest pain. He presented with chest pain and arm pain. EKG, demonstrated mildly prolonged QTc, but troponins were cycled and negative, and his chest pain resolve without intervention. It is likely that it was related to his abdominal pain. ACS ruled out.   # Cerebral palsy - continued home muscle relaxant regimen of baclofen+tizanidine - continued home lorazepam - continued home depakote 500mg  BID - continued home risperdal   Issues for Follow Up:  1. Needs follow up with Dr. Lajoyce Cornersuda for discussion about amputation / right toe management.  2. Needs colonoscopy.  3. Ensure that he finishes his course of antibiotics and has respiratory improvement.  4. Follow up abdominal pain  to make sure it continues to resolve.   Significant Procedures: none  Significant Labs and Imaging:   Recent Labs Lab 10/18/14 0739 10/19/14 0632 10/20/14 0528  WBC 7.5 8.2 8.7  HGB 11.6* 11.8* 11.0*  HCT 34.8* 37.2* 34.2*   PLT 263 391 468*    Recent Labs Lab 10/15/14 1844 10/17/14 1132 10/18/14 0739 10/19/14 0632 10/20/14 0528  NA 135 134* 138 138 141  K 3.9 4.0 4.0 4.2 4.0  CL 101 100* 102 104 107  CO2 GLUCOSE 139* 119* 126* 113* 98  BUN 12 5* <5* 7 14  CREATININE 0.59* 0.48* 0.56* 0.59* 0.52*  CALCIUM 8.7* 8.4* 8.5* 8.8* 8.5*  ALKPHOS 148*  --   --   --   --   AST 25  --   --   --   --   ALT 28  --   --   --   --   ALBUMIN 2.8*  --   --   --   --    Blood Culture - 1/2 GPC in clusters.   CRP - 18.4  Iron Panel  Iron - 46 UIBC - 221 TIBC - 267 Saturation Ratios - 17 Ferritin - 61  RBC - 4.52 Retic Ct Pct - 1.6 Retic Ct Manual - 72.3  ABI:  Summary: ABIs and Doppler waveforms indicate normal arterial flow bilaterally at rest.  Other specific details can be found in the table(s) above. Prepared and Electronically Authenticated by  Cari Caraway MD 2016-05-24T14:41:34  Results/Tests Pending at Time of Discharge: None  Discharge Medications:    Medication List    STOP taking these medications        levofloxacin 750 MG tablet  Commonly known as:  LEVAQUIN     multivitamin capsule      TAKE these medications        amoxicillin-clavulanate 250-125 MG per tablet  Commonly known as:  AUGMENTIN  Take 1 tablet by mouth every 8 (eight) hours.     baclofen 20 MG tablet  Commonly known as:  LIORESAL  Take 1 tablet (20 mg total) by mouth 4 (four) times daily.     diclofenac sodium 1 % Gel  Commonly known as:  VOLTAREN  Apply 2 g topically 4 (four) times daily.     divalproex 125 MG capsule  Commonly known as:  DEPAKOTE SPRINKLE  Take 500 mg by mouth 2 (two) times daily.     Hydrocortisone Acetate 1 % Crea  Apply 1 application topically daily. To face, every PM.     JOBST ANTI-EM KNEE HIGH MED Misc  by Does not apply route. Apply once in the morning and remove at bedtime daily     ketoconazole 2 % shampoo  Commonly known as:  NIZORAL  Apply 1  application topically 2 (two) times a week. Monday and Friday     ketoconazole 2 % cream  Commonly known as:  NIZORAL  Apply 1 application topically daily. Apply to face, skin folds, groin, underarms once daily     lansoprazole 30 MG disintegrating tablet  Commonly known as:  PREVACID SOLUTAB  Take 1 tablet (30 mg total) by mouth daily.     LORazepam 1 MG tablet  Commonly known as:  ATIVAN  Take 1 mg by mouth every 4 (four) hours as needed for anxiety (anxiety).     risperiDONE 2 MG tablet  Commonly known as:  RISPERDAL  Take 2 mg by mouth  at bedtime.     silver sulfADIAZINE 1 % cream  Commonly known as:  SILVADENE  Apply 1 application topically daily.     tiZANidine 2 MG tablet  Commonly known as:  ZANAFLEX  Take 1 tablet (2 mg total) by mouth 3 (three) times daily.     tiZANidine 4 MG tablet  Commonly known as:  ZANAFLEX  Take 2 mg by mouth 3 (three) times daily.     tolterodine 2 MG 24 hr capsule  Commonly known as:  DETROL LA  Take 1 capsule (2 mg total) by mouth daily.     traMADol 50 MG tablet  Commonly known as:  ULTRAM  Take 1 tablet (50 mg total) by mouth every 6 (six) hours as needed. For knee or joint pain.     tretinoin 0.025 % gel  Commonly known as:  RETIN-A  Apply 1 applicator topically at bedtime.     white petrolatum Gel  Commonly known as:  VASELINE  Apply 1 application topically daily.        Discharge Instructions: Please refer to Patient Instructions section of EMR for full details.  Patient was counseled important signs and symptoms that should prompt return to medical care, changes in medications, dietary instructions, activity restrictions, and follow up appointments.   Follow-Up Appointments: Follow-up Information    Follow up with DUDA,MARCUS V, MD. Schedule an appointment as soon as possible for a visit in 2 weeks.   Specialty:  Orthopedic Surgery   Contact information:   [address] [place] [address] [phone]        Follow up with Maryjean Ka, MD. Go on 11/01/2014.   Specialty:  Family Medicine   Why:  Hospital Follow Up - 9 am.    Contact information:   [address] [place] [address] [phone]       Yolande Jolly, MD 10/20/2014, 11:24 AM PGY-1, [facility]

## 2014-10-20 NOTE — Clinical Social Work Note (Signed)
Patient to be d/c'ed today [facility].  Patient and family agreeable to plans will transport via group home vehicle.  Windell MouldingEric Ashyr Hedgepath, MSW, Theresia MajorsLCSWA [phone]

## 2014-10-20 NOTE — Discharge Instructions (Signed)
We are glad that you are doing better.   You will need to continue to take your antibiotic for two more days until it is completed.   Keep the boot on your foot.   You will need to schedule a follow up appointment with Dr. Lajoyce Cornersuda for 2 weeks to discuss management of your right toe.   Thanks for letting us take care of you.   Sincerely,  Devota Pacealeb Anuradha Chabot, MD Family Medicine - PGY 1

## 2014-10-20 NOTE — Care Management Note (Signed)
Case Management Note  Patient Details  Name: [name] MRN: [id number] Date of Birth: 1960-07-19  Subjective/Objective:                 CM following for progression and d/c planning.   Action/Plan: CSW working with this pt re return to [facility].   Expected Discharge Date:        10/20/2014          Expected Discharge Plan:  Group Home  In-House Referral:  Clinical Social Work  Discharge planning Services  CM Consult  Post Acute Care Choice:    Choice offered to:  NA  DME Arranged:    DME Agency:     HH Arranged:    HH Agency:     Status of Service:  Completed, signed off  Medicare Important Message Given:  Yes Date Medicare IM Given:  10/20/14 Medicare IM give by:  Johny Shockheryl Eamonn Sermeno RN MPH, case manager Date Additional Medicare IM Given:    Additional Medicare Important Message give by:     If discussed at Long Length of Stay Meetings, dates discussed:    Additional Comments:  Starlyn SkeansRoyal, [place] Diana U, RN 10/20/2014, 12:40 PM

## 2014-10-24 LAB — CULTURE, BLOOD (ROUTINE X 2): Culture: NO GROWTH

## 2014-10-26 ENCOUNTER — Ambulatory Visit (INDEPENDENT_AMBULATORY_CARE_PROVIDER_SITE_OTHER): Payer: Medicare Other | Admitting: Podiatry

## 2014-10-26 ENCOUNTER — Encounter: Payer: Self-pay | Admitting: Podiatry

## 2014-10-26 VITALS — BP 130/76 | HR 76 | Temp 99.0°F | Resp 12

## 2014-10-26 DIAGNOSIS — L89891 Pressure ulcer of other site, stage 1: Secondary | ICD-10-CM | POA: Diagnosis not present

## 2014-10-26 DIAGNOSIS — L97519 Non-pressure chronic ulcer of other part of right foot with unspecified severity: Secondary | ICD-10-CM

## 2014-10-26 NOTE — Patient Instructions (Signed)
Continue apply Silvadene cream to skin ulcer on right great toe daily Keep your scheduled appointment with Dr. that was assigned at ED for possible toe amputation on 10/31/2014  [facility]

## 2014-10-26 NOTE — Progress Notes (Signed)
Patient ID: [name], male   DOB: 1961-02-05, 54 y.o.   MRN: [id number]  Subjective: This patient again presents with [place] his caregiver. Patient had multiple visits for ongoing ulceration/cellulitis on the right hallux. More recently on the visit of 07/05/2014 Dr. Ralene CorkSikora prescribed Silvadene cream and reevaluated patient on 08/02/2014 wear anti-bacterial soft soaks and Silvadene dressings were prescribed. Patient said several rounds of anabiotics of  clindamycin and Cipro for recurrent ulceration cellulitis in the right hallux with some temporary improvement. More recently was admitted to the emergency room on 10/15/2014 for cellulitis and GI evaluation. Patient's caregiver Benn Moulderrenee informed me that [name] has an appointment that was scheduled to the emergency department for possible amputation right hallux scheduled on 11/03/2014  Objective: Patient is seated in a wheelchair with his caregiver present The dorsal right hallux as a blisterlike area of that after debridement has a granular base with local erythema and a serous-like drainage. There is no warmth or malodor noted  Assessment: Recurrent ulceration cellulitis right hallux  Plan: Debrided ulcer right hallux and apply Silvadene dressing  I do agree with the advise tthat amputation right hallux could be beneficial as patient has recurrent ulceration and cellulitis in the right hallux with ongoing need to take anabiotics.  I advised patient caregiver to continue apply Silvadene cream and a light gauze dressing right hallux and keep the schedule appointment for consultation for possible amputation right hallux on 10/31/2014  Transfer care to ED referred surgeon for amputation right hallux

## 2014-11-01 ENCOUNTER — Ambulatory Visit (INDEPENDENT_AMBULATORY_CARE_PROVIDER_SITE_OTHER): Payer: Medicare Other | Admitting: Family Medicine

## 2014-11-01 ENCOUNTER — Encounter: Payer: Self-pay | Admitting: Family Medicine

## 2014-11-01 VITALS — BP 105/56 | HR 78 | Temp 98.6°F

## 2014-11-01 DIAGNOSIS — K5901 Slow transit constipation: Secondary | ICD-10-CM

## 2014-11-01 DIAGNOSIS — J302 Other seasonal allergic rhinitis: Secondary | ICD-10-CM | POA: Diagnosis not present

## 2014-11-01 DIAGNOSIS — K5900 Constipation, unspecified: Secondary | ICD-10-CM | POA: Insufficient documentation

## 2014-11-01 DIAGNOSIS — H6122 Impacted cerumen, left ear: Secondary | ICD-10-CM | POA: Diagnosis not present

## 2014-11-01 DIAGNOSIS — G809 Cerebral palsy, unspecified: Secondary | ICD-10-CM

## 2014-11-01 DIAGNOSIS — R109 Unspecified abdominal pain: Secondary | ICD-10-CM | POA: Diagnosis not present

## 2014-11-01 DIAGNOSIS — K51919 Ulcerative colitis, unspecified with unspecified complications: Secondary | ICD-10-CM | POA: Diagnosis not present

## 2014-11-01 MED ORDER — CETIRIZINE HCL 10 MG PO TABS: 10.0000 mg | ORAL_TABLET | Freq: Every day | ORAL | Status: DC

## 2014-11-01 MED ORDER — POLYETHYLENE GLYCOL 3350 17 GM/SCOOP PO POWD: 17.0000 g | Freq: Two times a day (BID) | ORAL | Status: DC | PRN

## 2014-11-01 NOTE — Progress Notes (Signed)
   Subjective:    Patient ID: Carola Rhineerry L Zellner, male    DOB: 01-01-1961, 54 y.o.   MRN: [id number]  HPI: Pt presents to clinic for hospital follow-up for colitis and possible CAP. He was admitted 5/21 - 5/26 and was found to have pancolitis on CT. He has completed a course of Augmentin and Flagyl. His last BM was yesterday and he states his belly pain is doing "some better." He has had no blood in his stool. His BM's are still loose but not as bad. He has had no fevers and his appetite is improving. He has no N/V. He does have some cough but no frank SOB or chest pain.  Of note, Dr. Lajoyce Cornersuda was consulted in the hospital about his right big toe; he has had chronic ulcerations and during the above hospitalization it was found on xray that he had some evidence for possible chronic osteomyelitis. He has also seen podiatry, and consideration of amputation of the toe has been recommended. His caregiver reports they are going to schedule an appointment with Dr. Lajoyce Cornersuda for further evaluation.  Review of Systems: As above. He does have some occasional sneezing / congestion and rhinorrhea attributed by caregiver to seasonal allergies.     Objective:   Physical Exam BP 105/56 mmHg  Pulse 78  Temp(Src) 98.6 F (37 C) (Oral) Gen: adult male, wheel-chair bound, in no acute distress; answers questions appropriately, though does have some mental delay  HEENT: PERRLA, EOMI  TM's clear bilaterally; partial cerumen impaction noted to the left external canal  MMM, no cervical lymphadenopathy  Nasal mucosae mildly inflamed with small amount of non-purulent rhinorrhea noted Pulm: CTAB, no increased WOB, appears comfortable on room air  Cardio: RRR, no murmur appreciated  Abd: soft, nontender, BS+; exam limited by positioning in wheelchair MSK: markedly thin limbs bilaterally with contractures limiting range of motion throughout Ext/Neuro: non-focal exam, global weakness with some contractures, does move all  extremities spontaneously Bilateral feet with foot drop (baseline)  Right toe not visualized (dressed with clean dressing and no soaking / bleeding noted) Skin: no frank rashes noted     Assessment & Plan:  54yo male with cerebral palsy, wheelchair bound, with resolved pancolitis and possible PNA - still with some constipation-type symptoms and some unrelated seasonal allergies - also noted to have left ear cerumen impaction, cleared with lavage - notable for chronic right toe ulcers, with planned f/u with Dr. Lajoyce Cornersuda (orthopedics)  Plan: - Continue current chronic medications without change (risperidal, baclofen, etc) - Rx for MiraLAX once or twice daily PRN for help with constipation - recommended daily antihistamine for seasonal allergies - continue local foot care as described by podiatry and encouraged close f/u with orthopedics as recommended by Dr. Lajoyce Cornersuda and podiatry - f/u otherwise as needed  Bobbye Mortonhristopher M Street, MD PGY-3, [facility] 11/01/2014, 4:20 PM

## 2014-11-01 NOTE — Patient Instructions (Signed)
Thank you for coming in, today!  It sounds like the belly infection is cleared up. You can take MiraLAX up to twice per day as needed for any constipation. For allergies, you can take cetirizine daily.  Make sure you see Dr. Lajoyce Cornersuda about your toe. I will give you information on how to get a colonoscopy.  Otherwise, come back to see us as you need. You're due for a yearly visit next February, but if you want to come back any time after July 1st to meet your new doctor, you can.  Please feel free to call with any questions or concerns at any time, at [phone]. --Dr. Casper HarrisonStreet

## 2014-11-03 ENCOUNTER — Telehealth: Payer: Self-pay | Admitting: *Deleted

## 2014-11-03 NOTE — Telephone Encounter (Signed)
[facility] informed our office that pt was not seen in home by their service on 10/20/2014 due to pt's hospitalization.

## 2014-11-07 ENCOUNTER — Telehealth: Payer: Self-pay | Admitting: *Deleted

## 2014-11-07 NOTE — Telephone Encounter (Signed)
[name] states I can leave message with [facility] for orders of care after pt's hospitalization.  I informed [name] they should continue the Silvadene to the right 1st toe.  [name] states the hospital report will be faxed to our office.

## 2014-11-07 NOTE — Telephone Encounter (Signed)
[facility] called to request NPI number. NPI number given x 6 visits.  Pt has appt for left toe wound.  Clovis Pu, RN

## 2014-11-14 ENCOUNTER — Encounter: Payer: Self-pay | Admitting: Physical Medicine & Rehabilitation

## 2014-11-14 ENCOUNTER — Telehealth: Payer: Self-pay | Admitting: Family Medicine

## 2014-11-14 ENCOUNTER — Ambulatory Visit (HOSPITAL_BASED_OUTPATIENT_CLINIC_OR_DEPARTMENT_OTHER): Payer: Medicare Other | Admitting: Physical Medicine & Rehabilitation

## 2014-11-14 ENCOUNTER — Encounter: Payer: Medicare Other | Attending: Physical Medicine & Rehabilitation

## 2014-11-14 VITALS — BP 118/70 | HR 102 | Resp 16

## 2014-11-14 DIAGNOSIS — M19211 Secondary osteoarthritis, right shoulder: Secondary | ICD-10-CM

## 2014-11-14 NOTE — Progress Notes (Signed)
Subjective:    Patient ID: [name], male    DOB: 10/15/1960, 54 y.o.   MRN: [id number]  HPI  Pain Inventory Average Pain 2 Pain Right Now 2 My pain is constant, dull and aching  In the last 24 hours, has pain interfered with the following? General activity 2 Relation with others 2 Enjoyment of life 2 What TIME of day is your pain at its worst? morning Sleep (in general) Good  Pain is worse with: some activites Pain improves with: heat/ice and medication Relief from Meds: 4  Mobility ability to climb steps?  no do you drive?  no use a wheelchair needs help with transfers  Function disabled: date disabled .  Neuro/Psych weakness trouble walking confusion depression anxiety  Prior Studies Any changes since last visit?  no  Physicians involved in your care Any changes since last visit?  no   History reviewed. No pertinent family history. History   Social History  . Marital Status: Single    Spouse Name: N/A  . Number of Children: N/A  . Years of Education: N/A   Social History Main Topics  . Smoking status: Never Smoker   . Smokeless tobacco: Never Used  . Alcohol Use: No  . Drug Use: No  . Sexual Activity: No   Other Topics Concern  . None   Social History Narrative   Past Surgical History  Procedure Laterality Date  . Stomach surgery      as an infant "couldn't keep food down"   Past Medical History  Diagnosis Date  . 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 CEREBRAL PALSY 07/24/2006  . HYPERLIPIDEMIA 07/02/2010  . GASTRIC ULCER ACUTE WITH HEMORRHAGE 07/24/2006  . 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 URINARY INCONTINENCE 02/09/2010  . History of hiatal hernia    BP 118/70 mmHg  Pulse 102  Resp 16  SpO2 100%  Opioid Risk Score:   Fall Risk Score: Low Fall Risk (0-5 points)`1  Depression screen PHQ 2/9  Depression screen [facility] 2/9 07/07/2014 11/09/2013 11/02/2013  Decreased Interest 0 0 0  Down, Depressed, Hopeless 0 0 0  PHQ - 2 Score 0 0 0      Review of Systems  HENT: Positive for congestion.   Eyes: Negative.   Respiratory: Positive for cough.   Cardiovascular: Negative.   Gastrointestinal: Negative.   Endocrine: Negative.   Genitourinary: Negative.   Musculoskeletal: Positive for myalgias, back pain and arthralgias.  Skin: Negative.   Allergic/Immunologic: Negative.   Neurological: Positive for weakness and numbness.  Hematological: Negative.   Psychiatric/Behavioral: Positive for dysphoric mood and decreased concentration. The patient is nervous/anxious.        Objective:   Physical Exam  Constitutional: He is oriented to person, place, and time. He appears well-developed and well-nourished.  HENT:  Head: Normocephalic and atraumatic.  Eyes:  + nystagmus  Musculoskeletal:  1+ to 2+ pedal edema bilateral  Left ankle is fused, healed surgical incisions No pain with limited range of motion  Skin left medial malleolus has some reddened areas no evidence of skin breakdown or drainage. No tenderness to palpation no ecchymosis. No problem over the left lateral malleolus or in the Achilles region. No heel breakdown  Neurological: He is alert and oriented to person, place, and time. He displays atrophy.  Atrophy bilateral foot and ankle area   Psychiatric: He has a normal mood and affect.  Nursing note and vitals reviewed.  Right shoulder abduction and flexion to 80 External rotation to  20 Positive impingement signs right shoulder No tenderness to palpation in the subacromial area Patient with decreased neck range of motion increased tone        Assessment & Plan:  1. 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 He has had good result with right shoulder intra-articular injection in the past. The previous injection was in February 2015 and now has had recurrence. The previous injection was under ultrasound guidance. We will try it without ultrasound guidance first as he had good results with  a left shoulder palpation guided injection. If he fails to get a good relief with a palpation guided injection we'll proceed to an ultrasound-guided injection    Shoulder injection Right  Palpation guided  Indication:Right Shoulder pain not relieved by medication management and other conservative care.  Informed consent was obtained after describing risks and benefits of the procedure with the patient, this includes bleeding, bruising, infection and medication side effects. The patient wishes to proceed and has given written consent. Patient was placed in a seated position. The Right shoulder was marked and prepped with betadine in the subacromial area. A 25-gauge 1-1/2 inch needle was inserted into the subacromial area. After negative draw back for blood, a solution containing 1 mL of 6 mg per ML betamethasone and 4 mL of 1% lidocaine was injected. A band aid was applied. The patient tolerated the procedure well. Post procedure instructions were given.  2.  Left ankle pain above malleoli, there is grade 1 decub medial malleolus but this is not painful Continue Prevalon boots

## 2014-11-14 NOTE — Patient Instructions (Signed)
Please call for a repeat right shoulder injection under ultrasound guidance if this injection is not helpful after one week. I can repeat the injection in about 2 months

## 2014-11-14 NOTE — Telephone Encounter (Signed)
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 --CMS

## 2014-11-14 NOTE — Telephone Encounter (Signed)
Received request for maintenance on pt's powered wheelchair (belt needs repair); specific order and office visits from 07/07/14 (annual visit) and 11/01/2014 (hospital f/u) to be faxed to [employer] in [place] [phone]). Relevant Dx: cerebral palsy, congential quadriplegia, mental retardation, neuropathy (ICD-9 codes 343.9, 343.2, 319, 355.9). Oversigned by Dr. Deirdre Priest (NPI [id number]) as this is for DME. --CMS

## 2014-11-17 ENCOUNTER — Emergency Department (HOSPITAL_COMMUNITY): Payer: Medicare Other

## 2014-11-17 ENCOUNTER — Telehealth: Payer: Self-pay | Admitting: Family Medicine

## 2014-11-17 ENCOUNTER — Emergency Department (HOSPITAL_BASED_OUTPATIENT_CLINIC_OR_DEPARTMENT_OTHER)
Admit: 2014-11-17 | Discharge: 2014-11-17 | Disposition: A | Discharge status: Home / Self Care | Payer: Medicare Other | Attending: Emergency Medicine | Admitting: Emergency Medicine

## 2014-11-17 ENCOUNTER — Encounter (HOSPITAL_COMMUNITY): Payer: Self-pay | Admitting: Emergency Medicine

## 2014-11-17 ENCOUNTER — Telehealth: Payer: Self-pay | Admitting: *Deleted

## 2014-11-17 ENCOUNTER — Inpatient Hospital Stay (HOSPITAL_COMMUNITY): Admit: 2014-11-17 | Payer: Medicare Other

## 2014-11-17 ENCOUNTER — Emergency Department (HOSPITAL_COMMUNITY)
Admission: EM | Admit: 2014-11-17 | Discharge: 2014-11-17 | Disposition: A | Discharge status: Home / Self Care | Payer: Medicare Other | Attending: Emergency Medicine | Admitting: Emergency Medicine

## 2014-11-17 DIAGNOSIS — Z872 Personal history of diseases of the skin and subcutaneous tissue: Secondary | ICD-10-CM | POA: Insufficient documentation

## 2014-11-17 DIAGNOSIS — N390 Urinary tract infection, site not specified: Secondary | ICD-10-CM | POA: Diagnosis not present

## 2014-11-17 DIAGNOSIS — Z8639 Personal history of other endocrine, nutritional and metabolic disease: Secondary | ICD-10-CM | POA: Insufficient documentation

## 2014-11-17 DIAGNOSIS — L97519 Non-pressure chronic ulcer of other part of right foot with unspecified severity: Secondary | ICD-10-CM | POA: Diagnosis not present

## 2014-11-17 DIAGNOSIS — R05 Cough: Secondary | ICD-10-CM | POA: Insufficient documentation

## 2014-11-17 DIAGNOSIS — G8250 Quadriplegia, unspecified: Secondary | ICD-10-CM | POA: Diagnosis not present

## 2014-11-17 DIAGNOSIS — M7989 Other specified soft tissue disorders: Secondary | ICD-10-CM

## 2014-11-17 DIAGNOSIS — M19071 Primary osteoarthritis, right ankle and foot: Secondary | ICD-10-CM | POA: Diagnosis not present

## 2014-11-17 DIAGNOSIS — M79609 Pain in unspecified limb: Secondary | ICD-10-CM | POA: Diagnosis not present

## 2014-11-17 DIAGNOSIS — R630 Anorexia: Secondary | ICD-10-CM | POA: Diagnosis not present

## 2014-11-17 DIAGNOSIS — K250 Acute gastric ulcer with hemorrhage: Secondary | ICD-10-CM | POA: Insufficient documentation

## 2014-11-17 DIAGNOSIS — M25462 Effusion, left knee: Secondary | ICD-10-CM | POA: Diagnosis not present

## 2014-11-17 DIAGNOSIS — R300 Dysuria: Secondary | ICD-10-CM | POA: Diagnosis present

## 2014-11-17 DIAGNOSIS — Z79899 Other long term (current) drug therapy: Secondary | ICD-10-CM | POA: Diagnosis not present

## 2014-11-17 DIAGNOSIS — R319 Hematuria, unspecified: Secondary | ICD-10-CM

## 2014-11-17 DIAGNOSIS — Z8659 Personal history of other mental and behavioral disorders: Secondary | ICD-10-CM | POA: Insufficient documentation

## 2014-11-17 LAB — COMPREHENSIVE METABOLIC PANEL
ALBUMIN: 2.7 g/dL — AB (ref 3.5–5.0)
ALT: 53 U/L (ref 17–63)
ANION GAP: 10 (ref 5–15)
AST: 33 U/L (ref 15–41)
Alkaline Phosphatase: 222 U/L — ABNORMAL HIGH (ref 38–126)
BUN: 11 mg/dL (ref 6–20)
CO2: 25 mmol/L (ref 22–32)
Calcium: 8.8 mg/dL — ABNORMAL LOW (ref 8.9–10.3)
Chloride: 103 mmol/L (ref 101–111)
Creatinine, Ser: 0.56 mg/dL — ABNORMAL LOW (ref 0.61–1.24)
GFR calc Af Amer: >60 mL/min (ref >60)
GFR calc non Af Amer: >60 mL/min (ref >60)
Glucose, Bld: 111 mg/dL — ABNORMAL HIGH (ref 65–99)
Potassium: 3.3 mmol/L — ABNORMAL LOW (ref 3.5–5.1)
SODIUM: 138 mmol/L (ref 135–145)
Total Bilirubin: 0.5 mg/dL (ref 0.3–1.2)
Total Protein: 6.8 g/dL (ref 6.5–8.1)

## 2014-11-17 LAB — URINALYSIS, ROUTINE W REFLEX MICROSCOPIC
Glucose, UA: NEGATIVE mg/dL
KETONES UR: 15 mg/dL — AB
NITRITE: NEGATIVE
Protein, ur: 100 mg/dL — AB
Specific Gravity, Urine: 1.027 (ref 1.005–1.030)
UROBILINOGEN UA: 4 mg/dL — AB (ref 0.0–1.0)
pH: 7.5 (ref 5.0–8.0)

## 2014-11-17 LAB — CBC WITH DIFFERENTIAL/PLATELET
BASOS PCT: 0 % (ref 0–1)
Basophils Absolute: 0 10*3/uL (ref 0.0–0.1)
EOS PCT: 1 % (ref 0–5)
Eosinophils Absolute: 0.1 10*3/uL (ref 0.0–0.7)
HEMATOCRIT: 34.5 % — AB (ref 39.0–52.0)
Hemoglobin: 11.2 g/dL — ABNORMAL LOW (ref 13.0–17.0)
LYMPHS ABS: 1.8 10*3/uL (ref 0.7–4.0)
Lymphocytes Relative: 25 % (ref 12–46)
MCH: 26 pg (ref 26.0–34.0)
MCHC: 32.5 g/dL (ref 30.0–36.0)
MCV: 80 fL (ref 78.0–100.0)
Monocytes Absolute: 0.8 10*3/uL (ref 0.1–1.0)
Monocytes Relative: 12 % (ref 3–12)
NEUTROS PCT: 62 % (ref 43–77)
Neutro Abs: 4.3 10*3/uL (ref 1.7–7.7)
Platelets: 294 10*3/uL (ref 150–400)
RBC: 4.31 MIL/uL (ref 4.22–5.81)
RDW: 15.7 % — ABNORMAL HIGH (ref 11.5–15.5)
WBC: 7 10*3/uL (ref 4.0–10.5)

## 2014-11-17 LAB — SEDIMENTATION RATE: Sed Rate: 75 mm/hr — ABNORMAL HIGH (ref 0–16)

## 2014-11-17 LAB — URINE MICROSCOPIC-ADD ON

## 2014-11-17 LAB — CBG MONITORING, ED: Glucose-Capillary: 93 mg/dL (ref 65–99)

## 2014-11-17 LAB — C-REACTIVE PROTEIN: CRP: 13.1 mg/dL — ABNORMAL HIGH (ref <1.0)

## 2014-11-17 LAB — I-STAT CG4 LACTIC ACID, ED: Lactic Acid, Venous: 1.36 mmol/L (ref 0.5–2.0)

## 2014-11-17 MED ORDER — SODIUM CHLORIDE 0.9 % IV BOLUS (SEPSIS)
1000.0000 mL | Freq: Once | INTRAVENOUS | Status: AC
  Administered 2014-11-17: 1000 mL via INTRAVENOUS

## 2014-11-17 MED ORDER — CEFUROXIME AXETIL 250 MG PO TABS: 250.0000 mg | ORAL_TABLET | Freq: Two times a day (BID) | ORAL | Status: DC

## 2014-11-17 NOTE — Telephone Encounter (Signed)
Care home [place]; Pt has painful urination , frequent, swelling in lower left extreminty--also warm to touch Increase in shortness in breath, moist cough Please call Charlie with advice

## 2014-11-17 NOTE — ED Notes (Signed)
Per [facility], pt lives at a group home, coming from doctors office. Pt positive for UTI,. Staff at facility says hes had a "decrease in mental status". Staff unsure of patients baseline. Staff says he was SOB, pt denies shortness of breath. 12 lead normal. VSS.

## 2014-11-17 NOTE — ED Provider Notes (Signed)
CSN: [id number]     Arrival date & time 11/17/14  1434 History   First MD Initiated Contact with Patient 11/17/14 1505     Chief Complaint  Patient presents with  . Urinary Tract Infection     (Consider location/radiation/quality/duration/timing/severity/associated sxs/prior Treatment) Patient is a 54 y.o. male presenting with urinary tract infection. The history is provided by a caregiver.  Urinary Tract Infection This is a new problem. The current episode started in the past 7 days. The problem occurs constantly. The problem has been unchanged. Associated symptoms include anorexia, coughing (productive) and urinary symptoms (dysuria, frequency, urgency). Pertinent negatives include no fever or vomiting. Associated symptoms comments: LLE swelling. Nothing aggravates the symptoms. He has tried nothing for the symptoms. The treatment provided no relief.    Past Medical History  Diagnosis Date  . 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 CEREBRAL PALSY 07/24/2006  . HYPERLIPIDEMIA 07/02/2010  . GASTRIC ULCER ACUTE WITH HEMORRHAGE 07/24/2006  . 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 URINARY INCONTINENCE 02/09/2010  . History of hiatal hernia    Past Surgical History  Procedure Laterality Date  . Stomach surgery      as an infant "couldn't keep food down"   No family history on file. History  Substance Use Topics  . Smoking status: Never Smoker   . Smokeless tobacco: Never Used  . Alcohol Use: No    Review of Systems  Constitutional: Negative for fever.  Respiratory: Positive for cough (productive).   Gastrointestinal: Positive for anorexia. Negative for vomiting.  All other systems reviewed and are negative.     Allergies  Adhesive  Home Medications   Prior to Admission medications   Medication Sig Start Date End Date Taking? Authorizing Provider  baclofen (LIORESAL) 20 MG tablet Take 1 tablet (20 mg total) by mouth 4 (four) times daily. 12/17/13  Yes [name], MD  cetirizine  (ZYRTEC) 10 MG tablet Take 1 tablet (10 mg total) by mouth daily. 11/01/14  Yes Stephanie Coup Street, MD  diclofenac sodium (VOLTAREN) 1 % GEL Apply 2 g topically 4 (four) times daily. 03/18/14  Yes [name], MD  divalproex (DEPAKOTE SPRINKLE) 125 MG capsule Take 500 mg by mouth 2 (two) times daily.    Yes Historical Provider, MD  Elastic Bandages & Supports (JOBST ANTI-EM KNEE HIGH MED) MISC by Does not apply route. Apply once in the morning and remove at bedtime daily   Yes Historical Provider, MD  Hydrocortisone Acetate 1 % CREA Apply 1 application topically daily. To face, every PM. 06/09/14  Yes Stephanie Coup Street, MD  ketoconazole (NIZORAL) 2 % cream Apply 1 application topically daily. Apply to face, skin folds, groin, underarms once daily 03/31/14  Yes Historical Provider, MD  ketoconazole (NIZORAL) 2 % shampoo Apply 1 application topically 2 (two) times a week. Monday and Friday   Yes Historical Provider, MD  lansoprazole (PREVACID SOLUTAB) 30 MG disintegrating tablet Take 1 tablet (30 mg total) by mouth daily. 07/31/10  Yes Nestor Ramp, MD  LORazepam (ATIVAN) 1 MG tablet Take 1 mg by mouth every 4 (four) hours as needed for anxiety (anxiety).    Yes Historical Provider, MD  Multiple Vitamin (MULTIVITAMIN) tablet Take 1 tablet by mouth daily.   Yes Historical Provider, MD  polyethylene glycol powder (GLYCOLAX/MIRALAX) powder Take 17 g by mouth 2 (two) times daily as needed. 11/01/14  Yes Stephanie Coup Street, MD  risperiDONE (RISPERDAL) 2 MG tablet Take 2 mg by mouth at bedtime.  Yes Historical Provider, MD  silver sulfADIAZINE (SILVADENE) 1 % cream Apply 1 application topically daily.  03/31/14  Yes Historical Provider, MD  tiZANidine (ZANAFLEX) 2 MG tablet Take 1 tablet (2 mg total) by mouth 3 (three) times daily. 05/17/14  Yes [name], MD  tolterodine (DETROL LA) 2 MG 24 hr capsule Take 1 capsule (2 mg total) by mouth daily. 04/08/13  Yes Stephanie Coup Street, MD  traMADol  (ULTRAM) 50 MG tablet Take 1 tablet (50 mg total) by mouth every 6 (six) hours as needed. For knee or joint pain. 11/23/13  Yes Stephanie Coup Street, MD  tretinoin (RETIN-A) 0.025 % gel Apply 1 applicator topically at bedtime. 04/09/13  Yes Stephanie Coup Street, MD  white petrolatum (VASELINE) GEL Apply 1 application topically daily. 03/14/14  Yes Leona Singleton, MD  cefUROXime (CEFTIN) 250 MG tablet Take 1 tablet (250 mg total) by mouth 2 (two) times daily with a meal. 11/17/14   Lyndal Pulley, MD   BP 121/67 mmHg  Pulse 78  Temp(Src) 98.3 F (36.8 C) (Oral)  Resp 12  SpO2 100% Physical Exam  Constitutional: He is oriented to person, place, and time. He appears well-developed. No distress.  HENT:  Head: Normocephalic and atraumatic.  Eyes: Conjunctivae are normal.  Neck: Neck supple. No tracheal deviation present.  Cardiovascular: Normal rate and regular rhythm.   Pulmonary/Chest: Effort normal. No respiratory distress.  Abdominal: Soft. He exhibits no distension.  Musculoskeletal:       Left knee: He exhibits swelling (from level of knee and below).       Right foot: There is tenderness (over ulceration and known chronic osteo). There is no swelling.  Neurological: He is alert and oriented to person, place, and time. He displays atrophy. GCS eye subscore is 4. GCS verbal subscore is 5. GCS motor subscore is 6.  Pt able to provide phone number to group home. Diffuse spastic quadriplegia  Skin: Skin is warm and dry.  Psychiatric: He has a normal mood and affect.    ED Course  Procedures (including critical care time) Labs Review Labs Reviewed  CBC WITH DIFFERENTIAL/PLATELET - Abnormal; Notable for the following:    Hemoglobin 11.2 (*)    HCT 34.5 (*)    RDW 15.7 (*)    All other components within normal limits  COMPREHENSIVE METABOLIC PANEL - Abnormal; Notable for the following:    Potassium 3.3 (*)    Glucose, Bld 111 (*)    Creatinine, Ser 0.56 (*)    Calcium 8.8 (*)     Albumin 2.7 (*)    Alkaline Phosphatase 222 (*)    All other components within normal limits  URINALYSIS, ROUTINE W REFLEX MICROSCOPIC (NOT AT [facility]) - Abnormal; Notable for the following:    Color, Urine AMBER (*)    APPearance TURBID (*)    Hgb urine dipstick MODERATE (*)    Bilirubin Urine SMALL (*)    Ketones, ur 15 (*)    Protein, ur 100 (*)    Urobilinogen, UA 4.0 (*)    Leukocytes, UA LARGE (*)    All other components within normal limits  SEDIMENTATION RATE - Abnormal; Notable for the following:    Sed Rate 75 (*)    All other components within normal limits  C-REACTIVE PROTEIN - Abnormal; Notable for the following:    CRP 13.1 (*)    All other components within normal limits  URINE MICROSCOPIC-ADD ON - Abnormal; Notable for the following:  Bacteria, UA FEW (*)    All other components within normal limits  URINE CULTURE  CBG MONITORING, ED  I-STAT CG4 LACTIC ACID, ED    Imaging Review Dg Chest 2 View  11/17/2014   CLINICAL DATA:  Shortness of breath for 1 day  EXAM: CHEST  2 VIEW  COMPARISON:  10/15/2014, 10/14/2014, 03/13/2014 CT scan  FINDINGS: Study is extremely limited by suboptimal patient positioning. The large hiatal hernia is again identified obscuring the cardiac silhouette. Mild airspace disease left lower lobe again noted likely atelectasis. More extensive opacity over the right lower lobe similar to prior study. Right pleural effusions suspected.  IMPRESSION: Extremely limited study again demonstrating known large hiatal hernia. Stable mild left lower lobe airspace disease is likely atelectasis. More extensive opacity over the right lower lobe likely represents a combination of consolidation and pleural effusion and is similar to 10/15/2014.   Electronically Signed   By: Esperanza Heir M.D.   On: 11/17/2014 17:19     EKG Interpretation None      MDM   Final diagnoses:  Urinary tract infection with hematuria, site unspecified    54 year old male with  history of spastic quadriplegia presents with several days of dysuria, frequency, and urgency with unknown exact onset. His caregiver also relates a recent history of ongoing wet cough and swelling of the left lower extremity. 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  UA to screen for UTI given typical symptoms, chest x-ray to rule out pneumonia, inflammatory markers for trending, DVT ultrasound to rule out left leg thrombus with risk factor of recent hospitalization for enteritis. Not on antibiotics currently. Recently finished a course of Augmentin and Flagyl.  Will broaden for partially treated urinary tract infection given positive findings on screening UA today awaiting culture. The patient was placed on a third generation cephalosporin and will be discharged to the care of his facility as he has no evidence of end organ damage or sepsis currently. Plan to follow up with PCP as needed and return precautions discussed for worsening or new concerning symptoms.   Soft tissue edema without DVT evident on left lower extremity ultrasound, recommended routine monitoring and follow-up.   Lyndal Pulley, MD 11/18/14 5409  Dione Booze, MD 11/18/14 [phone]

## 2014-11-17 NOTE — Telephone Encounter (Signed)
Per Billey Gosling, pt is having frequency and dysuria.  Left lower extremity is hot to touch and swollen.  Pt also has a very moist cough and SOB on excursion.  Pt 96-97% room air.  Per Billey Gosling, when he saw patient last week, he looks worse from then.  Per Dr. Casper Harrison have patient go to ED, no appts at [facility].  Billey Gosling was going to call pt's caregiver to send him to ED.  Clovis Pu, RN

## 2014-11-17 NOTE — ED Notes (Signed)
Pt answers all orientation questions except the year

## 2014-11-17 NOTE — ED Notes (Signed)
Pt transported to xray 

## 2014-11-17 NOTE — Progress Notes (Signed)
VASCULAR LAB PRELIMINARY  PRELIMINARY  PRELIMINARY  PRELIMINARY  Left lower extremity venous Doppler completed.    Preliminary report:  There is no DVT or SVT noted in the left lower extremity.  There is interstitial fluid noted throughout.  [name], RVT 11/17/2014, 6:31 PM

## 2014-11-17 NOTE — ED Notes (Signed)
Pt returned from xray

## 2014-11-17 NOTE — Telephone Encounter (Signed)
Copy of Physcian Verbal Order received 11/15/2014 requesting Dr. Leeanne Deed to sign wound care orders for post-op care.  Dr. Leeanne Deed states pt was discharged to ER for possible amputation surgery and the surgeon should write/sign wound care orders until pt is seen by our doctors for care.  Refaxed 11/15/2014 PVO orders and copy of 10/26/2014 OV notes highlighting the referral to ER.

## 2014-11-17 NOTE — Telephone Encounter (Signed)
[facility] - Kennith Gain reports pt today has dysuria, cough, red swollen left leg and he questioned if pt needed to be seen by a physician.  I called Mr. McLaughlin and recommended pt see his primary physician, due to the systemic symptoms. Mr. Merlinda Frederick agreed and will inform pt.

## 2014-11-17 NOTE — Discharge Instructions (Signed)

## 2014-11-21 LAB — URINE CULTURE

## 2014-11-23 ENCOUNTER — Telehealth (HOSPITAL_COMMUNITY): Payer: Self-pay

## 2014-11-23 NOTE — Telephone Encounter (Signed)
Post ED Visit - Positive Culture Follow-up  Culture report reviewed by antimicrobial stewardship pharmacist: []  Wes Dulaney, Pharm.D., BCPS [x]  Celedonio MiyamotoJeremy Frens, [address] BCPS []  Georgina PillionElizabeth Martin, Pharm.D., BCPS []  ColwynMinh Pham, [place]arm[place]D., BCPS, AAHIVP []  Estella HuskMichelle Turner, Pharm.D., BCPS, AAHIVP []  Elder CyphersLorie Poole, [address] BCPS  Positive urine culture Treated with cefuroxime axetil, organism sensitive to the same and no further patient follow-up is required at this time.  Ashley JacobsFesterman, Yzabelle Calles C 11/23/2014, 1:35 PM

## 2014-12-02 ENCOUNTER — Other Ambulatory Visit (HOSPITAL_COMMUNITY): Payer: Self-pay | Admitting: Orthopedic Surgery

## 2014-12-06 ENCOUNTER — Encounter (HOSPITAL_COMMUNITY): Payer: Self-pay

## 2014-12-06 ENCOUNTER — Encounter (HOSPITAL_COMMUNITY)
Admission: RE | Admit: 2014-12-06 | Discharge: 2014-12-06 | Disposition: A | Discharge status: Home / Self Care | Payer: Medicare Other | Source: Ambulatory Visit | Attending: Orthopedic Surgery | Admitting: Orthopedic Surgery

## 2014-12-06 DIAGNOSIS — M869 Osteomyelitis, unspecified: Secondary | ICD-10-CM | POA: Diagnosis not present

## 2014-12-06 DIAGNOSIS — Z01812 Encounter for preprocedural laboratory examination: Secondary | ICD-10-CM | POA: Insufficient documentation

## 2014-12-06 HISTORY — DX: Major depressive disorder, single episode, unspecified: F32.9

## 2014-12-06 HISTORY — DX: Personal history of other infectious and parasitic diseases: Z86.19

## 2014-12-06 HISTORY — DX: Weakness: R53.1

## 2014-12-06 HISTORY — DX: Anxiety disorder, unspecified: F41.9

## 2014-12-06 HISTORY — DX: Pneumonia, unspecified organism: J18.9

## 2014-12-06 HISTORY — DX: Depression, unspecified: F32.A

## 2014-12-06 HISTORY — DX: Constipation, unspecified: K59.00

## 2014-12-06 HISTORY — DX: Gastro-esophageal reflux disease without esophagitis: K21.9

## 2014-12-06 HISTORY — DX: Personal history of other medical treatment: Z92.89

## 2014-12-06 HISTORY — DX: Bipolar disorder, unspecified: F31.9

## 2014-12-06 HISTORY — DX: Other allergy status, other than to drugs and biological substances: Z91.09

## 2014-12-06 LAB — COMPREHENSIVE METABOLIC PANEL
ALT: 15 U/L — ABNORMAL LOW (ref 17–63)
AST: 17 U/L (ref 15–41)
Albumin: 3.6 g/dL (ref 3.5–5.0)
Alkaline Phosphatase: 124 U/L (ref 38–126)
Anion gap: 10 (ref 5–15)
BUN: 12 mg/dL (ref 6–20)
CHLORIDE: 103 mmol/L (ref 101–111)
CO2: 26 mmol/L (ref 22–32)
CREATININE: 0.52 mg/dL — AB (ref 0.61–1.24)
Calcium: 9.2 mg/dL (ref 8.9–10.3)
GFR calc Af Amer: >60 mL/min (ref >60)
GFR calc non Af Amer: >60 mL/min (ref >60)
GLUCOSE: 92 mg/dL (ref 65–99)
Potassium: 3.6 mmol/L (ref 3.5–5.1)
Sodium: 139 mmol/L (ref 135–145)
Total Bilirubin: 0.4 mg/dL (ref 0.3–1.2)
Total Protein: 7.7 g/dL (ref 6.5–8.1)

## 2014-12-06 LAB — CBC
HCT: 38.3 % — ABNORMAL LOW (ref 39.0–52.0)
Hemoglobin: 12.1 g/dL — ABNORMAL LOW (ref 13.0–17.0)
MCH: 25.5 pg — ABNORMAL LOW (ref 26.0–34.0)
MCHC: 31.6 g/dL (ref 30.0–36.0)
MCV: 80.6 fL (ref 78.0–100.0)
Platelets: 327 10*3/uL (ref 150–400)
RBC: 4.75 MIL/uL (ref 4.22–5.81)
RDW: 16.6 % — ABNORMAL HIGH (ref 11.5–15.5)
WBC: 7.4 10*3/uL (ref 4.0–10.5)

## 2014-12-06 LAB — PROTIME-INR
INR: 1.08 (ref 0.00–1.49)
PROTHROMBIN TIME: 14.2 s (ref 11.6–15.2)

## 2014-12-06 LAB — APTT: aPTT: 34 seconds (ref 24–37)

## 2014-12-06 NOTE — Progress Notes (Addendum)
Cardiologist denies having one  Medical Md is Dr.Abigail Natale MilchLancaster  Echo denies ever having one  Stress test denies ever having one  Heart cath denies ever having one  EKG in epic from 10-18-14  CXR in epic from 11-17-14

## 2014-12-06 NOTE — Pre-Procedure Instructions (Signed)
Nello L Gongaware  12/06/2014      [facility] - [address], [place] - [address] [place] [address] Phone: [phone] Fax: [phone]  [facility] [address] - [address], [place] - [address] [facility] [address] [place] [address] Phone: [phone] Fax: [phone]    Your procedure is scheduled on Wed, July 20 @ 11:00 AM  Report to [facility] Admitting at 9:00 AM  Call this number if you have problems the morning of surgery:  [phone]   Remember:  Do not eat food or drink liquids after midnight.  Take these medicines the morning of surgery with A SIP OF WATER Baclofen(Lioresal),Ceftin(Cefuroxime),Zyrtec(Cetirizine),Depakote(Divalproex),Prevacid(Lansoprazole), Ativan(Lorazepam),Detrol(Tolterodine),and Tramadol(Ultram-if needed)               No Goody's,BC's,Aleve,Aspirin,Ibuprofen,Fish Oil,or any Herbal Medications.    Do not wear jewelry  Do not wear lotions, powders, or colognes.  You may wear deodorant.  Men may shave face and neck.  Do not bring valuables to the hospital.  [facility] is not responsible for any belongings or valuables.  Contacts, dentures or bridgework may not be worn into surgery.  Leave your suitcase in the car.  After surgery it may be brought to your room.  For patients admitted to the hospital, discharge time will be determined by your treatment team.  Patients discharged the day of surgery will not be allowed to drive home.    Special instructions:   [place] - Preparing for Surgery  Before surgery, you can play an important role.  Because skin is not sterile, your skin needs to be as free of germs as possible.  You can reduce the number of germs on you skin by washing with CHG (chlorahexidine gluconate) soap before surgery.  CHG is an antiseptic cleaner which kills germs and bonds with the skin to continue killing  germs even after washing.  Please DO NOT use if you have an allergy to CHG or antibacterial soaps.  If your skin becomes reddened/irritated stop using the CHG and inform your nurse when you arrive at Short Stay.  Do not shave (including legs and underarms) for at least 48 hours prior to the first CHG shower.  You may shave your face.  Please follow these instructions carefully:   1.  Shower with CHG Soap the night before surgery and the                                morning of Surgery.  2.  If you choose to wash your hair, wash your hair first as usual with your       normal shampoo.  3.  After you shampoo, rinse your hair and body thoroughly to remove the                      Shampoo.  4.  Use CHG as you would any other liquid soap.  You can apply chg directly       to the skin and wash gently with scrungie or a clean washcloth.  5.  Apply the CHG Soap to your body ONLY FROM THE NECK DOWN.        Do not use on open wounds or open sores.  Avoid contact with your eyes,       ears, mouth and  genitals (private parts).  Wash genitals (private parts)       with your normal soap.  6.  Wash thoroughly, paying special attention to the area where your surgery        will be performed.  7.  Thoroughly rinse your body with warm water from the neck down.  8.  DO NOT shower/wash with your normal soap after using and rinsing off       the CHG Soap.  9.  Pat yourself dry with a clean towel.            10.  Wear clean pajamas.            11.  Place clean sheets on your bed the night of your first shower and do not        sleep with pets.  Day of Surgery  Do not apply any lotions/deoderants the morning of surgery.  Please wear clean clothes to the hospital/surgery center.   Please read over the following fact sheets that you were given. Pain Booklet, Coughing and Deep Breathing and Surgical Site Infection Prevention

## 2014-12-13 MED ORDER — CEFAZOLIN SODIUM-DEXTROSE 2-3 GM-% IV SOLR
2.0000 g | INTRAVENOUS | Status: AC
  Administered 2014-12-14: 2 g via INTRAVENOUS

## 2014-12-13 NOTE — Progress Notes (Signed)
Patient notified to arrive at 08:40

## 2014-12-13 NOTE — Anesthesia Preprocedure Evaluation (Addendum)
Anesthesia Evaluation  Patient identified by MRN, date of birth, ID band Patient awake    Reviewed: Allergy & Precautions, NPO status , Patient's Chart, lab work & pertinent test results, reviewed documented beta blocker date and time , Unable to perform ROS - Chart review only  Airway Mallampati: II   Neck ROM: Full    Dental  (+) Dental Advisory Given, Teeth Intact   Pulmonary  breath sounds clear to auscultation        Cardiovascular negative cardio ROS  Rhythm:Regular     Neuro/Psych Anxiety Depression Bipolar Disorder Cerebral palsy, mental retardation, spastic quadriplegia      GI/Hepatic hiatal hernia, PUD, Large Hiatal hernia   Endo/Other    Renal/GU      Musculoskeletal   Abdominal (+)  Abdomen: soft.    Peds  Hematology   Anesthesia Other Findings   Reproductive/Obstetrics                            Anesthesia Physical Anesthesia Plan  ASA: III  Anesthesia Plan: General   Post-op Pain Management:    Induction:   Airway Management Planned: Oral ETT  Additional Equipment:   Intra-op Plan:   Post-operative Plan: Extubation in OR  Informed Consent: I have reviewed the patients History and Physical, chart, labs and discussed the procedure including the risks, benefits and alternatives for the proposed anesthesia with the patient or authorized representative who has indicated his/her understanding and acceptance.     Plan Discussed with:   Anesthesia Plan Comments:         Anesthesia Quick Evaluation

## 2014-12-14 ENCOUNTER — Ambulatory Visit (HOSPITAL_COMMUNITY)
Admission: RE | Admit: 2014-12-14 | Discharge: 2014-12-15 | Disposition: A | Discharge status: Skilled Nursing Facility | Payer: Medicare Other | Source: Ambulatory Visit | Attending: Orthopedic Surgery | Admitting: Orthopedic Surgery

## 2014-12-14 ENCOUNTER — Ambulatory Visit (HOSPITAL_COMMUNITY): Payer: Medicare Other | Admitting: Vascular Surgery

## 2014-12-14 ENCOUNTER — Ambulatory Visit (HOSPITAL_COMMUNITY): Payer: Medicare Other | Admitting: Anesthesiology

## 2014-12-14 ENCOUNTER — Encounter (HOSPITAL_COMMUNITY)
Admission: RE | Disposition: A | Discharge status: Skilled Nursing Facility | Payer: Self-pay | Source: Ambulatory Visit | Attending: Orthopedic Surgery

## 2014-12-14 ENCOUNTER — Encounter (HOSPITAL_COMMUNITY): Payer: Self-pay | Admitting: *Deleted

## 2014-12-14 DIAGNOSIS — R200 Anesthesia of skin: Secondary | ICD-10-CM | POA: Diagnosis not present

## 2014-12-14 DIAGNOSIS — K219 Gastro-esophageal reflux disease without esophagitis: Secondary | ICD-10-CM | POA: Insufficient documentation

## 2014-12-14 DIAGNOSIS — F419 Anxiety disorder, unspecified: Secondary | ICD-10-CM | POA: Insufficient documentation

## 2014-12-14 DIAGNOSIS — K5900 Constipation, unspecified: Secondary | ICD-10-CM | POA: Diagnosis not present

## 2014-12-14 DIAGNOSIS — L97519 Non-pressure chronic ulcer of other part of right foot with unspecified severity: Secondary | ICD-10-CM | POA: Insufficient documentation

## 2014-12-14 DIAGNOSIS — F329 Major depressive disorder, single episode, unspecified: Secondary | ICD-10-CM | POA: Insufficient documentation

## 2014-12-14 DIAGNOSIS — G809 Cerebral palsy, unspecified: Secondary | ICD-10-CM | POA: Diagnosis not present

## 2014-12-14 DIAGNOSIS — R32 Unspecified urinary incontinence: Secondary | ICD-10-CM | POA: Insufficient documentation

## 2014-12-14 DIAGNOSIS — M869 Osteomyelitis, unspecified: Secondary | ICD-10-CM | POA: Insufficient documentation

## 2014-12-14 DIAGNOSIS — F319 Bipolar disorder, unspecified: Secondary | ICD-10-CM | POA: Insufficient documentation

## 2014-12-14 DIAGNOSIS — K449 Diaphragmatic hernia without obstruction or gangrene: Secondary | ICD-10-CM | POA: Insufficient documentation

## 2014-12-14 HISTORY — DX: Pure hypercholesterolemia, unspecified: E78.00

## 2014-12-14 HISTORY — PX: AMPUTATION: SHX166

## 2014-12-14 HISTORY — DX: Personal history of other diseases of the digestive system: Z87.19

## 2014-12-14 HISTORY — DX: Personal history of peptic ulcer disease: Z87.11

## 2014-12-14 HISTORY — DX: Unspecified chronic bronchitis: J42

## 2014-12-14 HISTORY — DX: Urinary tract infection, site not specified: N39.0

## 2014-12-14 SURGERY — AMPUTATION DIGIT
Anesthesia: General | Site: Toe | Laterality: Right

## 2014-12-14 MED ORDER — LACTATED RINGERS IV SOLN
INTRAVENOUS | Status: DC
  Administered 2014-12-14 (×2): via INTRAVENOUS

## 2014-12-14 MED ORDER — ASPIRIN EC 325 MG PO TBEC
325.0000 mg | DELAYED_RELEASE_TABLET | Freq: Every day | ORAL | Status: DC
  Administered 2014-12-15: 325 mg via ORAL
  Filled 2014-12-14: qty 1

## 2014-12-14 MED ORDER — TIZANIDINE HCL 2 MG PO TABS
2.0000 mg | ORAL_TABLET | Freq: Three times a day (TID) | ORAL | Status: DC
  Administered 2014-12-14 – 2014-12-15 (×3): 2 mg via ORAL
  Filled 2014-12-14 (×3): qty 1

## 2014-12-14 MED ORDER — 0.9 % SODIUM CHLORIDE (POUR BTL) OPTIME
TOPICAL | Status: DC | PRN
  Administered 2014-12-14: 1000 mL

## 2014-12-14 MED ORDER — ONDANSETRON HCL 4 MG/2ML IJ SOLN
INTRAMUSCULAR | Status: DC | PRN
  Administered 2014-12-14: 4 mg via INTRAVENOUS

## 2014-12-14 MED ORDER — ACETAMINOPHEN 325 MG PO TABS: 650.0000 mg | ORAL_TABLET | Freq: Four times a day (QID) | ORAL | Status: DC | PRN

## 2014-12-14 MED ORDER — METOCLOPRAMIDE HCL 5 MG PO TABS: 5.0000 mg | ORAL_TABLET | Freq: Three times a day (TID) | ORAL | Status: DC | PRN

## 2014-12-14 MED ORDER — MEPERIDINE HCL 25 MG/ML IJ SOLN: 6.2500 mg | INTRAMUSCULAR | Status: DC | PRN

## 2014-12-14 MED ORDER — EPHEDRINE SULFATE 50 MG/ML IJ SOLN
INTRAMUSCULAR | Status: AC
  Filled 2014-12-14: qty 1

## 2014-12-14 MED ORDER — LORAZEPAM 1 MG PO TABS: 1.0000 mg | ORAL_TABLET | ORAL | Status: DC | PRN

## 2014-12-14 MED ORDER — FENTANYL CITRATE (PF) 250 MCG/5ML IJ SOLN
INTRAMUSCULAR | Status: AC
  Filled 2014-12-14: qty 5

## 2014-12-14 MED ORDER — TRAMADOL HCL 50 MG PO TABS: 50.0000 mg | ORAL_TABLET | Freq: Four times a day (QID) | ORAL | Status: DC | PRN

## 2014-12-14 MED ORDER — ONDANSETRON HCL 4 MG/2ML IJ SOLN
INTRAMUSCULAR | Status: AC
  Filled 2014-12-14: qty 2

## 2014-12-14 MED ORDER — LIDOCAINE HCL (CARDIAC) 20 MG/ML IV SOLN
INTRAVENOUS | Status: AC
  Filled 2014-12-14: qty 5

## 2014-12-14 MED ORDER — PROPOFOL 10 MG/ML IV BOLUS
INTRAVENOUS | Status: AC
  Filled 2014-12-14: qty 20

## 2014-12-14 MED ORDER — SODIUM CHLORIDE 0.9 % IV SOLN
INTRAVENOUS | Status: DC
  Administered 2014-12-14: 13:00:00 via INTRAVENOUS

## 2014-12-14 MED ORDER — METHOCARBAMOL 500 MG PO TABS: 500.0000 mg | ORAL_TABLET | Freq: Four times a day (QID) | ORAL | Status: DC | PRN

## 2014-12-14 MED ORDER — DIVALPROEX SODIUM 125 MG PO CPSP: 500.0000 mg | ORAL_CAPSULE | Freq: Two times a day (BID) | ORAL | Status: DC

## 2014-12-14 MED ORDER — LIDOCAINE HCL (PF) 1 % IJ SOLN
INTRAMUSCULAR | Status: DC | PRN
  Administered 2014-12-14: 10 mL

## 2014-12-14 MED ORDER — DIVALPROEX SODIUM 125 MG PO CSDR
500.0000 mg | DELAYED_RELEASE_CAPSULE | Freq: Two times a day (BID) | ORAL | Status: DC
  Administered 2014-12-14 – 2014-12-15 (×2): 500 mg via ORAL
  Filled 2014-12-14 (×3): qty 4

## 2014-12-14 MED ORDER — MIDAZOLAM HCL 2 MG/2ML IJ SOLN
INTRAMUSCULAR | Status: AC
  Filled 2014-12-14: qty 2

## 2014-12-14 MED ORDER — FENTANYL CITRATE (PF) 100 MCG/2ML IJ SOLN: 25.0000 ug | INTRAMUSCULAR | Status: DC | PRN

## 2014-12-14 MED ORDER — METHOCARBAMOL 1000 MG/10ML IJ SOLN
500.0000 mg | Freq: Four times a day (QID) | INTRAVENOUS | Status: DC | PRN
  Filled 2014-12-14: qty 5

## 2014-12-14 MED ORDER — PHENYLEPHRINE 40 MCG/ML (10ML) SYRINGE FOR IV PUSH (FOR BLOOD PRESSURE SUPPORT)
PREFILLED_SYRINGE | INTRAVENOUS | Status: AC
  Filled 2014-12-14: qty 10

## 2014-12-14 MED ORDER — METOCLOPRAMIDE HCL 5 MG/ML IJ SOLN: 5.0000 mg | Freq: Three times a day (TID) | INTRAMUSCULAR | Status: DC | PRN

## 2014-12-14 MED ORDER — OXYCODONE HCL 5 MG PO TABS
5.0000 mg | ORAL_TABLET | ORAL | Status: DC | PRN
  Administered 2014-12-15: 5 mg via ORAL
  Filled 2014-12-14: qty 1

## 2014-12-14 MED ORDER — ONDANSETRON HCL 4 MG/2ML IJ SOLN: 4.0000 mg | Freq: Four times a day (QID) | INTRAMUSCULAR | Status: DC | PRN

## 2014-12-14 MED ORDER — EPHEDRINE SULFATE 50 MG/ML IJ SOLN
INTRAMUSCULAR | Status: DC | PRN
  Administered 2014-12-14 (×2): 10 mg via INTRAVENOUS

## 2014-12-14 MED ORDER — RISPERIDONE 2 MG PO TABS
2.0000 mg | ORAL_TABLET | Freq: Every day | ORAL | Status: DC
  Administered 2014-12-14: 2 mg via ORAL
  Filled 2014-12-14 (×2): qty 1

## 2014-12-14 MED ORDER — FESOTERODINE FUMARATE ER 4 MG PO TB24
4.0000 mg | ORAL_TABLET | Freq: Every day | ORAL | Status: DC
  Administered 2014-12-14 – 2014-12-15 (×2): 4 mg via ORAL
  Filled 2014-12-14 (×2): qty 1

## 2014-12-14 MED ORDER — POLYETHYLENE GLYCOL 3350 17 GM/SCOOP PO POWD: 17.0000 g | Freq: Two times a day (BID) | ORAL | Status: DC | PRN

## 2014-12-14 MED ORDER — LANSOPRAZOLE 30 MG PO TBDP: 30.0000 mg | ORAL_TABLET | Freq: Every day | ORAL | Status: DC

## 2014-12-14 MED ORDER — PROMETHAZINE HCL 25 MG/ML IJ SOLN
6.2500 mg | INTRAMUSCULAR | Status: DC | PRN
Start: 2014-12-14 — End: 2014-12-14

## 2014-12-14 MED ORDER — CEFAZOLIN SODIUM 1-5 GM-% IV SOLN
1.0000 g | Freq: Four times a day (QID) | INTRAVENOUS | Status: AC
  Administered 2014-12-14 – 2014-12-15 (×3): 1 g via INTRAVENOUS
  Filled 2014-12-14 (×4): qty 50

## 2014-12-14 MED ORDER — SUCCINYLCHOLINE CHLORIDE 20 MG/ML IJ SOLN
INTRAMUSCULAR | Status: AC
  Filled 2014-12-14: qty 1

## 2014-12-14 MED ORDER — ACETAMINOPHEN 650 MG RE SUPP: 650.0000 mg | Freq: Four times a day (QID) | RECTAL | Status: DC | PRN

## 2014-12-14 MED ORDER — BACLOFEN 20 MG PO TABS
20.0000 mg | ORAL_TABLET | Freq: Four times a day (QID) | ORAL | Status: DC
  Administered 2014-12-14 – 2014-12-15 (×4): 20 mg via ORAL
  Filled 2014-12-14 (×4): qty 1

## 2014-12-14 MED ORDER — PANTOPRAZOLE SODIUM 40 MG PO PACK
40.0000 mg | PACK | Freq: Every day | ORAL | Status: DC
  Administered 2014-12-15: 40 mg via ORAL
  Filled 2014-12-14: qty 20

## 2014-12-14 MED ORDER — POLYETHYLENE GLYCOL 3350 17 G PO PACK: 17.0000 g | PACK | Freq: Two times a day (BID) | ORAL | Status: DC | PRN

## 2014-12-14 MED ORDER — PHENYLEPHRINE HCL 10 MG/ML IJ SOLN
INTRAMUSCULAR | Status: DC | PRN
  Administered 2014-12-14 (×2): 80 ug via INTRAVENOUS

## 2014-12-14 MED ORDER — ONDANSETRON HCL 4 MG PO TABS: 4.0000 mg | ORAL_TABLET | Freq: Four times a day (QID) | ORAL | Status: DC | PRN

## 2014-12-14 MED ORDER — HYDROMORPHONE HCL 1 MG/ML IJ SOLN: 1.0000 mg | INTRAMUSCULAR | Status: DC | PRN

## 2014-12-14 MED ORDER — LIDOCAINE HCL (PF) 1 % IJ SOLN
INTRAMUSCULAR | Status: AC
  Filled 2014-12-14: qty 30

## 2014-12-14 SURGICAL SUPPLY — 30 items
BLADE SURG 21 STRL SS (BLADE) ×3 IMPLANT
BNDG COHESIVE 4X5 TAN STRL (GAUZE/BANDAGES/DRESSINGS) ×3 IMPLANT
BNDG ESMARK 4X9 LF (GAUZE/BANDAGES/DRESSINGS) IMPLANT
BNDG GAUZE ELAST 4 BULKY (GAUZE/BANDAGES/DRESSINGS) ×3 IMPLANT
COVER SURGICAL LIGHT HANDLE (MISCELLANEOUS) ×6 IMPLANT
DRAPE U-SHAPE 47X51 STRL (DRAPES) ×3 IMPLANT
DRSG ADAPTIC 3X8 NADH LF (GAUZE/BANDAGES/DRESSINGS) ×3 IMPLANT
DRSG PAD ABDOMINAL 8X10 ST (GAUZE/BANDAGES/DRESSINGS) ×3 IMPLANT
DURAPREP 26ML APPLICATOR (WOUND CARE) ×3 IMPLANT
ELECT REM PT RETURN 9FT ADLT (ELECTROSURGICAL) ×3
ELECTRODE REM PT RTRN 9FT ADLT (ELECTROSURGICAL) ×1 IMPLANT
GAUZE SPONGE 4X4 12PLY STRL (GAUZE/BANDAGES/DRESSINGS) IMPLANT
GLOVE BIOGEL PI IND STRL 9 (GLOVE) ×1 IMPLANT
GLOVE BIOGEL PI INDICATOR 9 (GLOVE) ×2
GLOVE SURG ORTHO 9.0 STRL STRW (GLOVE) ×3 IMPLANT
GOWN STRL REUS W/ TWL XL LVL3 (GOWN DISPOSABLE) ×2 IMPLANT
GOWN STRL REUS W/TWL XL LVL3 (GOWN DISPOSABLE) ×4
KIT BASIN OR (CUSTOM PROCEDURE TRAY) ×3 IMPLANT
KIT ROOM TURNOVER OR (KITS) ×3 IMPLANT
MANIFOLD NEPTUNE II (INSTRUMENTS) ×3 IMPLANT
NEEDLE 22X1 1/2 (OR ONLY) (NEEDLE) IMPLANT
NS IRRIG 1000ML POUR BTL (IV SOLUTION) ×3 IMPLANT
PACK ORTHO EXTREMITY (CUSTOM PROCEDURE TRAY) ×3 IMPLANT
PAD ARMBOARD 7.5X6 YLW CONV (MISCELLANEOUS) ×6 IMPLANT
SPONGE GAUZE 4X4 12PLY STER LF (GAUZE/BANDAGES/DRESSINGS) ×3 IMPLANT
SUCTION FRAZIER TIP 10 FR DISP (SUCTIONS) IMPLANT
SUT ETHILON 2 0 PSLX (SUTURE) ×3 IMPLANT
SYR CONTROL 10ML LL (SYRINGE) IMPLANT
TOWEL OR 17X24 6PK STRL BLUE (TOWEL DISPOSABLE) ×3 IMPLANT
TOWEL OR 17X26 10 PK STRL BLUE (TOWEL DISPOSABLE) ×3 IMPLANT

## 2014-12-14 NOTE — Op Note (Signed)
12/14/2014  11:04 AM  PATIENT:  Carola Rhineerry L Severt    PRE-OPERATIVE DIAGNOSIS:  osteomyelitis of right great toe  POST-OPERATIVE DIAGNOSIS:  Same  PROCEDURE:  AMPUTATION DIGIT RIGHT GREAT TOE FIRST RAY  SURGEON:  Nadara MustardUDA,Nimrat Woolworth V, MD  PHYSICIAN ASSISTANT:None ANESTHESIA:   General  PREOPERATIVE INDICATIONS:  Carola Rhineerry L Puzio is a  54 y.o. male with a diagnosis of osteomyelitis of right great toe who failed conservative measures and elected for surgical management.    The risks benefits and alternatives were discussed with the patient preoperatively including but not limited to the risks of infection, bleeding, nerve injury, cardiopulmonary complications, the need for revision surgery, among others, and the patient was willing to proceed.  OPERATIVE IMPLANTS: None  OPERATIVE FINDINGS: Deep retained hardware fusing the MTP joint  OPERATIVE PROCEDURE: Patient was brought to the operating room and underwent a general and aesthetic. After adequate levels anesthesia obtained patient's right lower extremity was prepped using DuraPrep draped into a sterile field a timeout was called. A local digital block was performed. Elliptical incision was made around the first metatarsal and great toe the toe was resected through the metatarsal shaft. Hemostasis was obtained. The wound was irrigated with normal saline. Incision was closed using 2-0 nylon. A sterile compressive dressing was applied. Patient was extubated taken to the PACU in stable condition.

## 2014-12-14 NOTE — Anesthesia Postprocedure Evaluation (Signed)
  Anesthesia Post-op Note  Patient: [name]  Procedure(s) Performed: Procedure(s): AMPUTATION DIGIT RIGHT GREAT TOE MTP (Right)  Patient Location: PACU  Anesthesia Type:General  Level of Consciousness: awake  Airway and Oxygen Therapy: Patient Spontanous Breathing  Post-op Pain: none  Post-op Assessment: Post-op Vital signs reviewed              Post-op Vital Signs: Reviewed and stable  Last Vitals:  Filed Vitals:   12/14/14 1130  BP: 137/66  Pulse: 88  Temp:   Resp: 14    Complications: No apparent anesthesia complications

## 2014-12-14 NOTE — Anesthesia Procedure Notes (Signed)
Procedure Name: LMA Insertion Date/Time: 12/14/2014 10:40 AM Performed by: Gavin PoundLOWDER, Binyomin Brann J Pre-anesthesia Checklist: Patient identified, Emergency Drugs available, Suction available, Patient being monitored and Timeout performed Patient Re-evaluated:Patient Re-evaluated prior to inductionOxygen Delivery Method: Circle system utilized Preoxygenation: Pre-oxygenation with 100% oxygen Intubation Type: IV induction Ventilation: Mask ventilation without difficulty LMA: LMA inserted LMA Size: 5.0 Number of attempts: 1 Placement Confirmation: positive ETCO2 and breath sounds checked- equal and bilateral Tube secured with: Tape Dental Injury: Teeth and Oropharynx as per pre-operative assessment

## 2014-12-14 NOTE — H&P (Signed)
[name] is an 54 y.o. male.   Chief Complaint: Osteomyelitis ulceration right great toe HPI: Patient is a 54 year old gentleman with cerebral palsy who presents with ulceration and exposed bone right great toe which has failed conservative wound care.  Past Medical History  Diagnosis Date  . CEREBRAL PALSY 07/24/2006  . GASTRIC ULCER ACUTE WITH HEMORRHAGE 07/24/2006  . URINARY INCONTINENCE 02/09/2010    takes Detrol daily-occasionally incontinence  . History of hiatal hernia   . Environmental allergies     takes Zyrtec daily  . Anxiety     takes Ativan daily as needed  . Constipation     takes Miralax daily as needed  . GERD (gastroesophageal reflux disease)     takes Pravacid daily  . Pneumonia     in 2015 and in June 2016  . Bipolar disorder     takes Depakote daily  . Weakness     numbness in extremities  . Depression     takes Risperdal nightly  . History of blood transfusion     no abnormal reaction noted  . History of shingles     Past Surgical History  Procedure Laterality Date  . Stomach surgery      as an infant "couldn't keep food down"  . Leg surgery Bilateral   . Head surgery  as a baby    due to "water head" per pt    No family history on file. Social History:  reports that he has never smoked. He has never used smokeless tobacco. He reports that he does not drink alcohol or use illicit drugs.  Allergies:  Allergies  Allergen Reactions  . Adhesive [Tape] Rash    No prescriptions prior to admission    No results found for this or any previous visit (from the past 48 hour(s)). No results found.  Review of Systems  All other systems reviewed and are negative.   There were no vitals taken for this visit. Physical Exam  Osteomyelitis ulceration exposed bone right great toe Assessment/Plan Assessment ostomy myelitis ulceration right great toe.  Plan: We'll plan for right great toe amputation through the MTP joint. Risks and benefits were  discussed including the risk of the wound not healing. Patient states he understands and wishes to proceed at this time.  DUDA,MARCUS V 12/14/2014, 6:46 AM

## 2014-12-14 NOTE — Transfer of Care (Signed)
Immediate Anesthesia Transfer of Care Note  Patient: [name]  Procedure(s) Performed: Procedure(s): AMPUTATION DIGIT RIGHT GREAT TOE MTP (Right)  Patient Location: PACU  Anesthesia Type:General  Level of Consciousness: awake  Airway & Oxygen Therapy: Patient Spontanous Breathing and Patient connected to nasal cannula oxygen  Post-op Assessment: Report given to RN and Post -op Vital signs reviewed and stable  Post vital signs: Reviewed and stable  Last Vitals:  Filed Vitals:   12/14/14 0947  BP: 137/66  Pulse: 72  Temp: 35.6 C  Resp: 18    Complications: No apparent anesthesia complications

## 2014-12-15 ENCOUNTER — Encounter (HOSPITAL_COMMUNITY): Payer: Self-pay | Admitting: Orthopedic Surgery

## 2014-12-15 DIAGNOSIS — M869 Osteomyelitis, unspecified: Secondary | ICD-10-CM | POA: Diagnosis not present

## 2014-12-15 NOTE — Discharge Summary (Signed)
Physician Discharge Summary  Patient ID: [name] MRN: [id number] DOB/AGE: 1961/05/19 54 y.o.  Admit date: 12/14/2014 Discharge date: 12/15/2014  Admission Diagnoses: Osteomyelitis great toe right foot  Discharge Diagnoses:  Active Problems:   Osteomyelitis of toe of right foot   Discharged Condition: stable  Hospital Course: Patient's hospital course was essentially unremarkable. He underwent amputation of the great toe just proximal to the MTP joint. Postoperatively patient progressed well and was discharged back to his group home.  Consults: None  Significant Diagnostic Studies: labs: Routine labs  Treatments: surgery: See operative note  Discharge Exam: Blood pressure 102/68, pulse 78, temperature 98.4 F (36.9 C), temperature source Axillary, resp. rate 17, height 6' (1.829 m), weight 81.647 kg (180 lb), SpO2 99 %. Incision/Wound: dressing clean and dry  Disposition: 01-Home or Self Care  Discharge Instructions    Call MD / Call 911    Complete by:  As directed   If you experience chest pain or shortness of breath, CALL 911 and be transported to the hospital emergency room.  If you develope a fever above 101 F, pus (white drainage) or increased drainage or redness at the wound, or calf pain, call your surgeon's office.     Call MD / Call 911    Complete by:  As directed   If you experience chest pain or shortness of breath, CALL 911 and be transported to the hospital emergency room.  If you develope a fever above 101 F, pus (white drainage) or increased drainage or redness at the wound, or calf pain, call your surgeon's office.     Constipation Prevention    Complete by:  As directed   Drink plenty of fluids.  Prune juice may be helpful.  You may use a stool softener, such as Colace (over the counter) 100 mg twice a day.  Use MiraLax (over the counter) for constipation as needed.     Constipation Prevention    Complete by:  As directed   Drink plenty of fluids.   Prune juice may be helpful.  You may use a stool softener, such as Colace (over the counter) 100 mg twice a day.  Use MiraLax (over the counter) for constipation as needed.     Diet - low sodium heart healthy    Complete by:  As directed      Diet - low sodium heart healthy    Complete by:  As directed      Increase activity slowly as tolerated    Complete by:  As directed      Increase activity slowly as tolerated    Complete by:  As directed             Medication List    STOP taking these medications        silver sulfADIAZINE 1 % cream  Commonly known as:  SILVADENE      TAKE these medications        baclofen 20 MG tablet  Commonly known as:  LIORESAL  Take 1 tablet (20 mg total) by mouth 4 (four) times daily.     cetirizine 10 MG tablet  Commonly known as:  ZYRTEC  Take 1 tablet (10 mg total) by mouth daily.     diclofenac sodium 1 % Gel  Commonly known as:  VOLTAREN  Apply 2 g topically 4 (four) times daily.     divalproex 125 MG capsule  Commonly known as:  DEPAKOTE SPRINKLE  Take 500 mg by  mouth 2 (two) times daily.     Hydrocortisone Acetate 1 % Crea  Apply 1 application topically daily. To face, every PM.     JOBST ANTI-EM KNEE HIGH MED Misc  by Does not apply route. Apply once in the morning and remove at bedtime daily     ketoconazole 2 % shampoo  Commonly known as:  NIZORAL  Apply 1 application topically 2 (two) times a week. Monday and Friday     ketoconazole 2 % cream  Commonly known as:  NIZORAL  Apply 1 application topically daily. Apply to face, skin folds, groin, underarms once daily     lansoprazole 30 MG disintegrating tablet  Commonly known as:  PREVACID SOLUTAB  Take 1 tablet (30 mg total) by mouth daily.     LORazepam 1 MG tablet  Commonly known as:  ATIVAN  Take 1 mg by mouth every 4 (four) hours as needed for anxiety (anxiety).     multivitamin tablet  Take 1 tablet by mouth daily.     polyethylene glycol powder powder   Commonly known as:  GLYCOLAX/MIRALAX  Take 17 g by mouth 2 (two) times daily as needed.     risperiDONE 2 MG tablet  Commonly known as:  RISPERDAL  Take 2 mg by mouth at bedtime.     tiZANidine 2 MG tablet  Commonly known as:  ZANAFLEX  Take 1 tablet (2 mg total) by mouth 3 (three) times daily.     tolterodine 2 MG 24 hr capsule  Commonly known as:  DETROL LA  Take 1 capsule (2 mg total) by mouth daily.     traMADol 50 MG tablet  Commonly known as:  ULTRAM  Take 1 tablet (50 mg total) by mouth every 6 (six) hours as needed. For knee or joint pain.     tretinoin 0.025 % gel  Commonly known as:  RETIN-A  Apply 1 applicator topically at bedtime.     white petrolatum Gel  Commonly known as:  VASELINE  Apply 1 application topically daily.      ASK your doctor about these medications        cefUROXime 250 MG tablet  Commonly known as:  CEFTIN  Take 1 tablet (250 mg total) by mouth 2 (two) times daily with a meal.           Follow-up Information    Follow up with Alyah Boehning V, MD In 2 weeks.   Specialty:  Orthopedic Surgery   Contact information:   [address] [place] [address] [phone]       Signed: Nadara Mustard 12/15/2014, 8:17 AM

## 2014-12-15 NOTE — Care Management Note (Signed)
Case Management Note  Patient Details  Name: [name] MRN: [id number] Date of Birth: 04/23/1961  Subjective/Objective:                    Action/Plan: Confirmed with Dr Lajoyce Corners no home health needs  Expected Discharge Date:                  Expected Discharge Plan:     In-House Referral:     Discharge planning Services     Post Acute Care Choice:    Choice offered to:     DME Arranged:    DME Agency:     HH Arranged:    HH Agency:     Status of Service:     Medicare Important Message Given:    Date Medicare IM Given:    Medicare IM give by:    Date Additional Medicare IM Given:    Additional Medicare Important Message give by:     If discussed at Long Length of Stay Meetings, dates discussed:    Additional Comments:  [name] Plan, RN 12/15/2014, 12:10 PM

## 2014-12-15 NOTE — Progress Notes (Signed)
[name] to be D/C'd Group Home: [name] per MD order.  Discussed with the patient and all questions fully answered.  VSS, Surgical site clean, dry, intact with dressing in place.  IV catheter discontinued intact. Site without signs and symptoms of complications. Dressing and pressure applied.  An After Visit Summary was printed and given to the patient.  D/c education completed with patient/family including follow up instructions, medication list, d/c activities limitations if indicated, with other d/c instructions as indicated by MD - patient able to verbalize understanding, all questions fully answered.   Patient instructed to return to ED, call 911, or call MD for any changes in condition.   Patient escorted via [facility], and D/C home via Engineer, maintenance.  Caretaker updated.  [name] 12/15/2014 12:52 PM

## 2014-12-19 ENCOUNTER — Telehealth: Payer: Self-pay | Admitting: *Deleted

## 2014-12-19 NOTE — Telephone Encounter (Signed)
Request for Reinstatement of Care fax.  Dr. Leeanne Deed states pt has not been seen in our office since his 12/14/2014 surgery, his care is with the surgeon until reevaluated.  Faxed.

## 2014-12-28 ENCOUNTER — Telehealth: Payer: Self-pay | Admitting: *Deleted

## 2014-12-28 NOTE — Telephone Encounter (Addendum)
Dr. Leeanne Deed states pt needs to be reevaluated prior to resumption of care, pt's health status may have change after the surgery.  These orders may best be signed by his surgeon.  Faxed note to [facility].

## 2014-12-29 ENCOUNTER — Telehealth: Payer: Self-pay | Admitting: *Deleted

## 2014-12-29 NOTE — Telephone Encounter (Signed)
Return fax state surgeon's wound care extends to 2 weeks post-op.  I wrote a note stating pt was a No Show for 12/28/2014.  Faxed to [phone].

## 2015-01-06 ENCOUNTER — Other Ambulatory Visit: Payer: Self-pay | Admitting: *Deleted

## 2015-01-09 MED ORDER — TRAMADOL HCL 50 MG PO TABS: 50.0000 mg | ORAL_TABLET | Freq: Four times a day (QID) | ORAL | Status: DC | PRN

## 2015-04-17 ENCOUNTER — Other Ambulatory Visit: Payer: Self-pay | Admitting: Family Medicine

## 2015-05-11 ENCOUNTER — Ambulatory Visit (HOSPITAL_BASED_OUTPATIENT_CLINIC_OR_DEPARTMENT_OTHER): Payer: Medicare Other | Admitting: Physical Medicine & Rehabilitation

## 2015-05-11 ENCOUNTER — Encounter: Payer: Self-pay | Admitting: Physical Medicine & Rehabilitation

## 2015-05-11 ENCOUNTER — Encounter: Payer: Medicare Other | Attending: Physical Medicine & Rehabilitation

## 2015-05-11 VITALS — BP 120/71 | HR 67

## 2015-05-11 DIAGNOSIS — Z8701 Personal history of pneumonia (recurrent): Secondary | ICD-10-CM | POA: Diagnosis not present

## 2015-05-11 DIAGNOSIS — M25562 Pain in left knee: Secondary | ICD-10-CM

## 2015-05-11 DIAGNOSIS — Z87440 Personal history of urinary (tract) infections: Secondary | ICD-10-CM | POA: Insufficient documentation

## 2015-05-11 DIAGNOSIS — K219 Gastro-esophageal reflux disease without esophagitis: Secondary | ICD-10-CM | POA: Insufficient documentation

## 2015-05-11 DIAGNOSIS — K5900 Constipation, unspecified: Secondary | ICD-10-CM | POA: Diagnosis not present

## 2015-05-11 DIAGNOSIS — F419 Anxiety disorder, unspecified: Secondary | ICD-10-CM | POA: Diagnosis not present

## 2015-05-11 DIAGNOSIS — R32 Unspecified urinary incontinence: Secondary | ICD-10-CM | POA: Diagnosis not present

## 2015-05-11 DIAGNOSIS — G808 Other cerebral palsy: Secondary | ICD-10-CM | POA: Diagnosis not present

## 2015-05-11 DIAGNOSIS — R200 Anesthesia of skin: Secondary | ICD-10-CM | POA: Diagnosis not present

## 2015-05-11 DIAGNOSIS — E7800 Pure hypercholesterolemia, unspecified: Secondary | ICD-10-CM | POA: Insufficient documentation

## 2015-05-11 DIAGNOSIS — F319 Bipolar disorder, unspecified: Secondary | ICD-10-CM | POA: Insufficient documentation

## 2015-05-11 NOTE — Patient Instructions (Addendum)
Do gentle range of motion to Left knee twice a day  Continue Voltaren 4 times per day  May use tramadol for pain 50 mg 3 times a day as needed  Knee x-ray ordered x-rays for imaging

## 2015-05-11 NOTE — Progress Notes (Signed)
Subjective:    Patient ID: [name], male    DOB: 05-23-1961, 54 y.o.   MRN: [id number]  HPI 54 year old male with history of cerebral palsy and spastic tetraplegia. He's had problems with hip adductor spasticity as well as right shoulder pain. These issues are not bothering him much today. He has had some problems with his left knee. He hit it against the bed but no other trauma. He's been a different wheelchair which does not allow his legs to go up and down. He is getting his usual motorized chair repaired. He's had no sweats and chills. No other joint swelling. No pain in the leg or in the foot. Pain Inventory Average Pain 10 Pain Right Now 8 My pain is sharp, stabbing and aching  In the last 24 hours, has pain interfered with the following? General activity 7 Relation with others 5 Enjoyment of life 5 What TIME of day is your pain at its worst? Morning, Daytime, Evening and Night Sleep (in general) Fair  Pain is worse with: sitting and some activites Pain improves with: rest, heat/ice and medication Relief from Meds: 5  Mobility how many minutes can you walk? 0 ability to climb steps?  no do you drive?  no use a wheelchair Do you have any goals in this area?  no  Function disabled: date disabled NA  Neuro/Psych numbness  Prior Studies Any changes since last visit?  no  Physicians involved in your care Any changes since last visit?  no   Family History  Problem Relation Age of Onset  . Adopted: Yes   Social History   Social History  . Marital Status: Single    Spouse Name: N/A  . Number of Children: N/A  . Years of Education: N/A   Social History Main Topics  . Smoking status: Never Smoker   . Smokeless tobacco: Never Used  . Alcohol Use: No  . Drug Use: No  . Sexual Activity: No   Other Topics Concern  . None   Social History Narrative   Past Surgical History  Procedure Laterality Date  . Leg surgery Bilateral ~ 1967    "legs were  scissored; stretched them out"  . Head surgery  as a baby    due to "water head"   . Hernia repair    . Hiatal hernia repair  1982  . Amputation Right 12/14/2014    Procedure: AMPUTATION DIGIT RIGHT GREAT TOE MTP;  Surgeon: Nadara Mustard, MD;  Location: [facility] OR;  Service: Orthopedics;  Laterality: Right;   Past Medical History  Diagnosis Date  . CEREBRAL PALSY 07/24/2006  . GASTRIC ULCER ACUTE WITH HEMORRHAGE 07/24/2006  . URINARY INCONTINENCE 02/09/2010    takes Detrol daily-occasionally incontinence  . History of hiatal hernia   . Environmental allergies     takes Zyrtec daily  . Anxiety     takes Ativan daily as needed  . Constipation     takes Miralax daily as needed  . GERD (gastroesophageal reflux disease)     takes Pravacid daily  . Bipolar disorder (HCC)     takes Depakote daily  . Weakness     numbness in extremities  . Depression     takes Risperdal nightly  . History of blood transfusion     no abnormal reaction noted  . History of shingles   . Hypercholesterolemia   . Pneumonia "quite a few times"    in 2015 and in June 2016  .  Chronic bronchitis (HCC)     "gets it q yr"  . History of stomach ulcers ~ 1996  . Recurrent UTI (urinary tract infection)    BP 120/71 mmHg  Pulse 67  SpO2 100%  Opioid Risk Score:   Fall Risk Score:  `1  Depression screen PHQ 2/9  [facility] 07/07/2014 11/09/2013 11/02/2013  Decreased Interest 0 0 0  Down, Depressed, Hopeless 0 0 0  PHQ - 2 Score 0 0 0      Review of Systems  Cardiovascular: Positive for leg swelling.  Neurological: Positive for numbness.  All other systems reviewed and are negative.      Objective:   Physical Exam  Constitutional: He is oriented to person, place, and time. He appears well-developed and well-nourished.  HENT:  Head: Normocephalic and atraumatic.  Eyes: Pupils are equal, round, and reactive to light.  Disconjugate gaze, chronic  Musculoskeletal:       Left knee: He  exhibits decreased range of motion and swelling. He exhibits no effusion. Tenderness found. Patellar tendon tenderness noted.  There is pain with flexion and extension of the knee with limited range of motion on the left side.  Pitting edema of the left leg but nontender. There is pitting edema of the foot but no pain with range of motion of tenderness no erythema.  Neurological: He is alert and oriented to person, place, and time.  Spastic quadriparesis Nonambulatory 2 minus bilateral lower limbs 3 minus bilateral upper limbs  Nursing note and vitals reviewed.   1+ pitting edema left pretibial area as well as 2+ edema in the left foot. There is no tenderness palpation over the leg or over the foot area no pain with ankle range of motion. The left knee has pain with flexion and with extension. He has limited range of motion with increased extensor tone. There is no erythema      Assessment & Plan:   1. Left knee pain with decreased range of motion. This is most likely due to his current positioning. He has not been in his regular electric wheelchair and no longer has the ability to change positions on a frequent basis.  There is some mild swelling in the left knee. We'll check for an effusion.   Left knee limited ultrasound 12 Hz linear transducer utilized. Patient in a seated position. Left knee was examined at the medial joint line no evidence of significant effusion. There was a small less than 1 cm suprapatellar effusion.  Given the minimal amount of effusion present no attempts for aspiration were made.   We'll check knee x-rays to look for osteoarthritis  Recommend gentle range of motion to twice a day. Continue Voltaren gel Continue when necessary tramadol  Follow-up in 3 months or sooner depending on symptoms. If evidence of osteo-arthritis on the x-ray may be a good candidate for Cortico steroid injection

## 2015-05-16 ENCOUNTER — Ambulatory Visit: Payer: Medicare Other | Admitting: Physical Medicine & Rehabilitation

## 2015-06-08 ENCOUNTER — Ambulatory Visit (INDEPENDENT_AMBULATORY_CARE_PROVIDER_SITE_OTHER): Payer: Medicare Other | Admitting: Family Medicine

## 2015-06-08 ENCOUNTER — Encounter: Payer: Self-pay | Admitting: Family Medicine

## 2015-06-08 VITALS — BP 127/58 | HR 68 | Temp 98.8°F

## 2015-06-08 DIAGNOSIS — L97521 Non-pressure chronic ulcer of other part of left foot limited to breakdown of skin: Secondary | ICD-10-CM | POA: Diagnosis not present

## 2015-06-08 DIAGNOSIS — L03116 Cellulitis of left lower limb: Secondary | ICD-10-CM | POA: Diagnosis present

## 2015-06-08 DIAGNOSIS — L97501 Non-pressure chronic ulcer of other part of unspecified foot limited to breakdown of skin: Secondary | ICD-10-CM | POA: Insufficient documentation

## 2015-06-08 DIAGNOSIS — L0390 Cellulitis, unspecified: Secondary | ICD-10-CM | POA: Insufficient documentation

## 2015-06-08 MED ORDER — CEPHALEXIN 500 MG PO CAPS: 500.0000 mg | ORAL_CAPSULE | Freq: Two times a day (BID) | ORAL | Status: AC

## 2015-06-08 NOTE — Progress Notes (Signed)
   Subjective:    Patient ID: [name], male    DOB: 1960-07-23, 55 y.o.   MRN: [id number]  Seen for Same day visit for   CC: Wound  He reports wound on Left ankle for past several days that has been slowing getting worse. Denies trauma, fevers. Mild pain. Similar wounds in past related to pressure sores secondary to CP and contractures. Previous Right great toe amputation due to osteomyelitis.   Review of Systems   See HPI for ROS. Objective:  BP 127/58 mmHg  Pulse 68  Temp(Src) 98.8 F (37.1 C) (Oral)  Wt   General: NAD Cardiac: RRR, normal heart sounds, no murmurs. 2+ radial and PT pulses bilaterally Respiratory: CTAB, normal effort Extremities: Bilateral upper and lower extremity contractures.  Left Foot: Superficial Ulcer on left medial ankle with surrounding erythema and warmth. 2+ pitting edema. DP pulse 1+    Assessment & Plan:   Foot ulcer, limited to breakdown of skin (HCC) Likely Pressure ulcer secondary to cerebral palsy. Hx of osteomyelitis in other foot - Referred to wound management - f/u in 1 week for reassessment  Cellulitis Superficial ulcer with surrounding erthyema and and warmth. No systemic signs but hx of osteo in other foot - Keflex BID x 7 days.  - F/u in 1 week for reassessment

## 2015-06-08 NOTE — Assessment & Plan Note (Signed)
Superficial ulcer with surrounding erthyema and and warmth. No systemic signs but hx of osteo in other foot - Keflex BID x 7 days.  - F/u in 1 week for reassessment

## 2015-06-08 NOTE — Patient Instructions (Signed)
It was great seeing you today.   1. Take Keflex 500mg  twice a day for 1 week.  2. I have placed an ordered for home health wound care. Please let me know if you have not been contacted by them in the next 2 days.   Next Appointment  Please make an appointment with Dr Gayla DossJoyner in 1 -2 weeks for wound re-evaluation   If you have any questions or concerns before then, please call the clinic at [phone].  Take Care,   Dr Wenda LowJames Jacklin Zwick

## 2015-06-08 NOTE — Assessment & Plan Note (Signed)
Likely Pressure ulcer secondary to cerebral palsy. Hx of osteomyelitis in other foot - Referred to wound management - f/u in 1 week for reassessment

## 2015-06-09 ENCOUNTER — Encounter: Payer: Self-pay | Admitting: Clinical

## 2015-06-09 NOTE — Progress Notes (Signed)
CS[facility] CSW informed that pt will be seen today or tomorrow.  Theresia BoughNorma Meghann Landing, MSW, LCSW [phone]

## 2015-06-10 ENCOUNTER — Telehealth: Payer: Self-pay | Admitting: Student

## 2015-06-10 NOTE — Telephone Encounter (Signed)
[name] [name] called regarding dressing change instruction for ulcer on left foot. We were asked if he should continue solver dressings or start wet to dry.Given the description in the last clinic note on 1/12 it is superficial in nature, we will continue the silver dressings he already getting and he will continue the keflex as prescribed in clinic to complete the course. He has a follow up in our clinic on 1/24 and will follow up sooner if needed  [name] A. Kennon RoundsHaney MD, MS Family Medicine Resident PGY-2 Pager [phone]

## 2015-06-20 ENCOUNTER — Encounter: Payer: Self-pay | Admitting: Internal Medicine

## 2015-06-20 ENCOUNTER — Ambulatory Visit (INDEPENDENT_AMBULATORY_CARE_PROVIDER_SITE_OTHER): Payer: Medicare Other | Admitting: Internal Medicine

## 2015-06-20 VITALS — BP 104/60 | HR 74 | Temp 98.8°F

## 2015-06-20 DIAGNOSIS — L97521 Non-pressure chronic ulcer of other part of left foot limited to breakdown of skin: Secondary | ICD-10-CM | POA: Diagnosis present

## 2015-06-20 MED ORDER — PREVALON HEEL PROTECTOR MISC: 1.0000 | Freq: Every day | Status: DC

## 2015-06-20 NOTE — Progress Notes (Signed)
   Subjective:    Patient ID: [name], male    DOB: 05/25/1961, 55 y.o.   MRN: [id number]  HPI  Patient presents for wound follow-up.   He was first seen in office two weeks ago for a superficial wound on his L medial ankle that began a few days prior to that appointment.On exam, he had warmth, erythema, and pitting edema surrounding the wound. He was referred to wound management and started on Keflex BID. The patient lives in a group home, and they have been changing his bandage daily. He has also been seen by Home Health weekly. They last saw the patient four days ago and report that the wound appears to be improving.   Patient denies fevers, nausea/vomiting, increased pain or swelling, or any other symptoms.  He has had similar sounds in the past related to pressure ulcers secondary to contractures from cerebral palsy.   Review of Systems See HPI.     Objective:   Physical Exam  Constitutional: He appears well-developed and well-nourished.  In wheelchair due to cerebral palsy  Musculoskeletal:       Feet:      Assessment & Plan:   [name] is a 55 yo M presenting for follow-up of a foot wound.   Foot ulcer, limited to breakdown of skin (HCC) Improving. Healing well per Home Health and on my physical exam. Denies symptoms of systemic infection, so further antibiotic treatment is not indicated.  - Continue dressing changes  - Home Health will continue to follow to monitor progress - Patient given information regarding signs of infection   Tarri Abernethy, MD PGY-1 [facility]

## 2015-06-20 NOTE — Assessment & Plan Note (Signed)
Improving. Healing well per Home Health and on my physical exam. Denies symptoms of systemic infection, so further antibiotic treatment is not indicated.  - Continue dressing changes  - Home Health will continue to follow to monitor progress - Patient given information regarding signs of infection

## 2015-06-20 NOTE — Patient Instructions (Signed)
It was nice meeting you today Camron!  Your foot wound appears to be improving. It does not look infected at all today. Home Health should continue to change the dressings as they have been.   If you develop fevers, nausea, vomiting, or notice that you have a lot of swelling in your leg again, please call the office, as these may be signs of infection.   If you have any questions or concerns, please feel free to call the office at any time.   Be well,  Dr. Natale Milch

## 2015-06-22 ENCOUNTER — Telehealth: Payer: Self-pay | Admitting: Internal Medicine

## 2015-06-22 NOTE — Telephone Encounter (Signed)
Heather from [facility] is calling for verbal orders for weekly visits for next few weeks to check pt's wound.  Please advise.  Sadie Reynolds, ASA

## 2015-06-23 NOTE — Telephone Encounter (Signed)
Left message on Heather's voicemail authorizing weekly visits.

## 2015-06-23 NOTE — Telephone Encounter (Signed)
Please let [name] or the appropriate contact person that I authorize [facility] to continue weekly visits for up to 6 weeks to continue monitoring [name]'s foot wound. Thank you! - AJL

## 2015-07-12 ENCOUNTER — Ambulatory Visit (INDEPENDENT_AMBULATORY_CARE_PROVIDER_SITE_OTHER): Payer: Medicare Other | Admitting: Internal Medicine

## 2015-07-12 ENCOUNTER — Encounter: Payer: Self-pay | Admitting: Internal Medicine

## 2015-07-12 VITALS — BP 107/59 | HR 78 | Temp 98.6°F

## 2015-07-12 DIAGNOSIS — Z0000 Encounter for general adult medical examination without abnormal findings: Secondary | ICD-10-CM

## 2015-07-12 DIAGNOSIS — L97521 Non-pressure chronic ulcer of other part of left foot limited to breakdown of skin: Secondary | ICD-10-CM

## 2015-07-12 MED ORDER — SILVER SULFADIAZINE 1 % EX CREA: 1.0000 "application " | TOPICAL_CREAM | Freq: Every day | CUTANEOUS | Status: DC

## 2015-07-12 NOTE — Assessment & Plan Note (Signed)
Annual physical for insurance purposes. Other than well-healing foot ulcer, no abnormalities found on physical exam. No complaints and no recent symptoms.  - Completed insurance paperwork including FL-2 - F/u in one year or sooner if needed

## 2015-07-12 NOTE — Progress Notes (Signed)
   Subjective:    Patient ID: [name], male    DOB: 10-10-1960, 55 y.o.   MRN: [id number]  HPI  Patient presents for follow-up of diabetic foot ulcer and annual physical.   Foot ulcer Patient still followed by wound care at least once weekly. Is still having regular dressing changes performed either by wound care or by staff members at his residential living facility. Wound care feels that the ulcer is improving. Patient denies pain in the affected foot, but does endorses edema, but attributes this to not elevated his legs as recommended. Also endorses itching at the site of the ulcer.   Health maintenance Other than foot ulcer, patient has no complaints. No recent history of fever, cough, congestion, N/V/C/D, abdominal pain, neck pain, changes in vision, recurrent HA. Patient still living at residential living facility.   Review of Systems See HPI.     Objective:   Physical Exam  Constitutional: He is oriented to person, place, and time. He appears well-developed and well-nourished. No distress.  Sitting in motorized chair  HENT:  Head: Normocephalic and atraumatic.  Right Ear: External ear normal.  Left Ear: External ear normal.  Mouth/Throat: Oropharynx is clear and moist. No oropharyngeal exudate.  Eyes: Conjunctivae and EOM are normal. Pupils are equal, round, and reactive to light. Right eye exhibits no discharge. Left eye exhibits no discharge.  Neck: Neck supple.  Cardiovascular: Normal rate, regular rhythm and normal heart sounds.   No murmur heard. Pulmonary/Chest: Effort normal and breath sounds normal. No respiratory distress. He has no wheezes. He has no rales. He exhibits no tenderness.  Abdominal: Soft. Bowel sounds are normal. He exhibits no distension. There is no tenderness. There is no rebound and no guarding.  Musculoskeletal: He exhibits edema (1+ pitting edema of LLE to midshin. No edema of RLE. ).  Contractures of both upper extremities. R arm in sling.  Wearing soft boots to prevent pressure ulcers on both feet.   Lymphadenopathy:    He has no cervical adenopathy.  Neurological: He is alert and oriented to person, place, and time. No cranial nerve deficit.  Skin: Skin is warm and dry. He is not diaphoretic.  Well-healing ulcer with serosanguinous discharge on L medial ankle.   Psychiatric: He has a normal mood and affect. His behavior is normal.      Assessment & Plan:  Foot ulcer, limited to breakdown of skin (HCC) Improving. Patient still followed by wound care weekly and is having regular dressing changes either by wound care or at his residential living facility. Good granulation of tissue on physical exam.  - Continue wound care  - F/u if not improving or worsening  Healthcare maintenance Annual physical for insurance purposes. Other than well-healing foot ulcer, no abnormalities found on physical exam. No complaints and no recent symptoms.  - Completed insurance paperwork including FL-2 - F/u in one year or sooner if needed   Tarri Abernethy, MD PGY-1 [facility]

## 2015-07-12 NOTE — Assessment & Plan Note (Signed)
Improving. Patient still followed by wound care weekly and is having regular dressing changes either by wound care or at his residential living facility. Good granulation of tissue on physical exam.  - Continue wound care  - F/u if not improving or worsening

## 2015-07-12 NOTE — Patient Instructions (Addendum)
It was nice seeing you today Mr. Mohr.   You appear to be doing very well, and I have no concerns today.   I will fill out your paperwork and have it faxed to you tomorrow.   If you have any questions or concerns, please do not hesitate to call the office.   Be well,  Dr. Natale Milch

## 2015-07-24 ENCOUNTER — Telehealth: Payer: Self-pay | Admitting: Internal Medicine

## 2015-07-24 NOTE — Telephone Encounter (Signed)
Heather with [facility] would like to have verbal orders to extend services for Mr. Borre.  If need to d/c before receiving orders please inform of this as well.  Wound hasn't healed yet.  Previous order was for 6wk period which will end next week.  Would like to extended orders prior to that timeframe.

## 2015-07-28 ENCOUNTER — Other Ambulatory Visit: Payer: Self-pay | Admitting: Family Medicine

## 2015-07-28 ENCOUNTER — Other Ambulatory Visit: Payer: Self-pay | Admitting: Physical Medicine & Rehabilitation

## 2015-08-08 ENCOUNTER — Encounter: Payer: Self-pay | Admitting: Physical Medicine & Rehabilitation

## 2015-08-08 ENCOUNTER — Encounter: Payer: Medicare Other | Attending: Physical Medicine & Rehabilitation

## 2015-08-08 ENCOUNTER — Ambulatory Visit (HOSPITAL_BASED_OUTPATIENT_CLINIC_OR_DEPARTMENT_OTHER): Payer: Medicare Other | Admitting: Physical Medicine & Rehabilitation

## 2015-08-08 VITALS — BP 106/56 | HR 100

## 2015-08-08 DIAGNOSIS — G808 Other cerebral palsy: Secondary | ICD-10-CM | POA: Diagnosis not present

## 2015-08-08 DIAGNOSIS — K219 Gastro-esophageal reflux disease without esophagitis: Secondary | ICD-10-CM | POA: Insufficient documentation

## 2015-08-08 DIAGNOSIS — M25562 Pain in left knee: Secondary | ICD-10-CM | POA: Diagnosis not present

## 2015-08-08 DIAGNOSIS — Z87440 Personal history of urinary (tract) infections: Secondary | ICD-10-CM | POA: Insufficient documentation

## 2015-08-08 DIAGNOSIS — K5900 Constipation, unspecified: Secondary | ICD-10-CM | POA: Insufficient documentation

## 2015-08-08 DIAGNOSIS — G8250 Quadriplegia, unspecified: Secondary | ICD-10-CM

## 2015-08-08 DIAGNOSIS — E7800 Pure hypercholesterolemia, unspecified: Secondary | ICD-10-CM | POA: Insufficient documentation

## 2015-08-08 DIAGNOSIS — R200 Anesthesia of skin: Secondary | ICD-10-CM | POA: Diagnosis not present

## 2015-08-08 DIAGNOSIS — F419 Anxiety disorder, unspecified: Secondary | ICD-10-CM | POA: Insufficient documentation

## 2015-08-08 DIAGNOSIS — F319 Bipolar disorder, unspecified: Secondary | ICD-10-CM | POA: Insufficient documentation

## 2015-08-08 DIAGNOSIS — Z8701 Personal history of pneumonia (recurrent): Secondary | ICD-10-CM | POA: Insufficient documentation

## 2015-08-08 DIAGNOSIS — R32 Unspecified urinary incontinence: Secondary | ICD-10-CM | POA: Insufficient documentation

## 2015-08-08 MED ORDER — BACLOFEN 20 MG PO TABS: 20.0000 mg | ORAL_TABLET | Freq: Three times a day (TID) | ORAL | Status: DC

## 2015-08-08 NOTE — Progress Notes (Signed)
Subjective:    Patient ID: [name], male    DOB: 04/12/1961, 55 y.o.   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 He is wheelchair bound. He lives in a group home. One of his caregivers is with him today. He remains on baclofen 20 mg 4 times a day as well as Zanaflex 4 mg 3 times per day. His caregiver indicates that he is drowsy.  Patient is complaining of right arm and shoulder pain. He is dysarthric but is able to answer simple questions. His caregiver states that he leans heavily toward the right side. He is supposed to get a new wheelchair this year. The patient states he used to have some type of pad that help support his trunk but that "wore out" Pain Inventory Average Pain 6 Pain Right Now 0 My pain is aching  In the last 24 hours, has pain interfered with the following? General activity 5 Relation with others 5 Enjoyment of life 5 What TIME of day is your pain at its worst? na Sleep (in general) Fair  Pain is worse with: other Pain improves with: medication Relief from Meds: 7  Mobility use a wheelchair needs help with transfers  Function I need assistance with the following:  feeding, dressing, bathing, toileting, meal prep, household duties and shopping  Neuro/Psych No problems in this area  Prior Studies Any changes since last visit?  no  Physicians involved in your care Any changes since last visit?  no   Family History  Problem Relation Age of Onset  . Adopted: Yes   Social History   Social History  . Marital Status: Single    Spouse Name: N/A  . Number of Children: N/A  . Years of Education: N/A   Social History Main Topics  . Smoking status: Never Smoker   . Smokeless tobacco: Never Used  . Alcohol Use: No  . Drug Use: No  . Sexual Activity: No   Other Topics Concern  . None   Social History Narrative   Past Surgical History  Procedure Laterality Date  . Leg surgery  Bilateral ~ 1967    "legs were scissored; stretched them out"  . Head surgery  as a baby    due to "water head"   . Hernia repair    . Hiatal hernia repair  1982  . 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 CEREBRAL PALSY 07/24/2006  . GASTRIC ULCER ACUTE WITH HEMORRHAGE 07/24/2006  . URINARY INCONTINENCE 02/09/2010    takes Detrol daily-occasionally incontinence  . History of hiatal hernia   . Environmental allergies     takes Zyrtec daily  . Anxiety     takes Ativan daily as needed  . Constipation     takes Miralax daily as needed  . GERD (gastroesophageal reflux disease)     takes Pravacid daily  . Bipolar disorder (HCC)     takes Depakote daily  . Weakness     numbness in extremities  . Depression     takes Risperdal nightly  . History of blood transfusion     no abnormal reaction noted  . History of shingles   . Hypercholesterolemia   . Pneumonia "quite a few times"    in 2015 and in June 2016  .  Chronic bronchitis (HCC)     "gets it q yr"  . History of stomach ulcers ~ 1996  . Recurrent UTI (urinary tract infection)    BP 106/56 mmHg  Pulse 100  SpO2 97%  Opioid Risk Score:   Fall Risk Score:  `1  Depression screen PHQ 2/9  Depression screen [facility] 2/9 08/08/2015 06/20/2015 07/07/2014 11/09/2013 11/02/2013  Decreased Interest 0 0 0 0 0  Down, Depressed, Hopeless 0 0 0 0 0  PHQ - 2 Score 0 0 0 0 0     Review of Systems  All other systems reviewed and are negative.      Objective:   Physical Exam  Constitutional: He appears well-developed and well-nourished.  HENT:  Head: Normocephalic and atraumatic.  Eyes: Conjunctivae and EOM are normal. Pupils are equal, round, and reactive to light.  Neck: Normal range of motion.  Neurological: Coordination and gait abnormal.  Patient has 3 minus  strength in the left deltoid, biceps, triceps, finger flexors, 2 minus on the right side, 0 at bilateral hip flexor knee extensors and ankle dorsiflexors His tone is reduced in the right lower extremity but increase in the left lower extremity at the abductors and plantar flexor Ashworth grade 2  He is ataxic left upper extremity  Psychiatric: His affect is blunt and inappropriate. His speech is delayed and slurred. He is slowed. Cognition and memory are impaired.  Nursing note and vitals reviewed.  Wheelchair posture leans heavily toward the right side.  He has no pain with range of motion of his right shoulder. He does have tenderness to pressure over his upper arm corresponding to where he presses against the wheelchair armrest         Assessment & Plan:  1. Spastic quadriplegia secondary to cerebral palsy. His tone is relatively well controlled, given that he has some increased sedation and I would reduce his baclofen to 20 mg 3 times a day and continue the tizanidine 4 mg 3 times a day  In addition he would benefit from a torso support on the right side. I discussed this with his caregiver from the group home and she will address this when they go for a new wheelchair evaluation 3. Right upper extremity pain he does have a history of adhesive capsulitis however his shoulder pain does not occur with range of motion. It's mainly pressure along the upper outer arm corresponding to pressure over the wheelchair armrest. Would recommend a torso support. In the meantime staff will place a pillow just medial to his armrest so he has additional cushioning  Return to clinic in 3 months

## 2015-08-08 NOTE — Patient Instructions (Signed)
Tone seems okay today  Right shoulder pain appears to be mainly in the upper arm where he leans against his armrest. His new chair should have a torso pad to keep him from leaning towards the right.  We will reduce baclofen to 3 times a day

## 2015-08-11 ENCOUNTER — Encounter (HOSPITAL_COMMUNITY): Payer: Self-pay | Admitting: Emergency Medicine

## 2015-08-11 ENCOUNTER — Emergency Department (HOSPITAL_COMMUNITY): Payer: Medicare Other

## 2015-08-11 ENCOUNTER — Emergency Department (HOSPITAL_COMMUNITY)
Admission: EM | Admit: 2015-08-11 | Discharge: 2015-08-11 | Disposition: A | Discharge status: Home / Self Care | Payer: Medicare Other | Attending: Emergency Medicine | Admitting: Emergency Medicine

## 2015-08-11 DIAGNOSIS — F319 Bipolar disorder, unspecified: Secondary | ICD-10-CM | POA: Diagnosis not present

## 2015-08-11 DIAGNOSIS — F419 Anxiety disorder, unspecified: Secondary | ICD-10-CM | POA: Diagnosis not present

## 2015-08-11 DIAGNOSIS — R5383 Other fatigue: Secondary | ICD-10-CM | POA: Diagnosis present

## 2015-08-11 DIAGNOSIS — R292 Abnormal reflex: Secondary | ICD-10-CM | POA: Diagnosis not present

## 2015-08-11 DIAGNOSIS — Z87440 Personal history of urinary (tract) infections: Secondary | ICD-10-CM | POA: Diagnosis not present

## 2015-08-11 DIAGNOSIS — Z7952 Long term (current) use of systemic steroids: Secondary | ICD-10-CM | POA: Diagnosis not present

## 2015-08-11 DIAGNOSIS — K5900 Constipation, unspecified: Secondary | ICD-10-CM | POA: Diagnosis not present

## 2015-08-11 DIAGNOSIS — R410 Disorientation, unspecified: Secondary | ICD-10-CM | POA: Insufficient documentation

## 2015-08-11 DIAGNOSIS — Z8639 Personal history of other endocrine, nutritional and metabolic disease: Secondary | ICD-10-CM | POA: Diagnosis not present

## 2015-08-11 DIAGNOSIS — Z8709 Personal history of other diseases of the respiratory system: Secondary | ICD-10-CM | POA: Insufficient documentation

## 2015-08-11 DIAGNOSIS — Z8619 Personal history of other infectious and parasitic diseases: Secondary | ICD-10-CM | POA: Insufficient documentation

## 2015-08-11 DIAGNOSIS — R443 Hallucinations, unspecified: Secondary | ICD-10-CM | POA: Diagnosis not present

## 2015-08-11 DIAGNOSIS — K219 Gastro-esophageal reflux disease without esophagitis: Secondary | ICD-10-CM | POA: Diagnosis not present

## 2015-08-11 DIAGNOSIS — Z79899 Other long term (current) drug therapy: Secondary | ICD-10-CM | POA: Insufficient documentation

## 2015-08-11 DIAGNOSIS — G529 Cranial nerve disorder, unspecified: Secondary | ICD-10-CM | POA: Diagnosis not present

## 2015-08-11 DIAGNOSIS — Z8701 Personal history of pneumonia (recurrent): Secondary | ICD-10-CM | POA: Diagnosis not present

## 2015-08-11 DIAGNOSIS — M6240 Contracture of muscle, unspecified site: Secondary | ICD-10-CM | POA: Insufficient documentation

## 2015-08-11 LAB — COMPREHENSIVE METABOLIC PANEL
ALBUMIN: 3.4 g/dL — AB (ref 3.5–5.0)
ALK PHOS: 109 U/L (ref 38–126)
ALT: 14 U/L — AB (ref 17–63)
AST: 14 U/L — ABNORMAL LOW (ref 15–41)
Anion gap: 12 (ref 5–15)
BUN: 11 mg/dL (ref 6–20)
CO2: 23 mmol/L (ref 22–32)
CREATININE: 0.51 mg/dL — AB (ref 0.61–1.24)
Calcium: 9.1 mg/dL (ref 8.9–10.3)
Chloride: 106 mmol/L (ref 101–111)
GFR calc Af Amer: >60 mL/min (ref >60)
GFR calc non Af Amer: >60 mL/min (ref >60)
GLUCOSE: 94 mg/dL (ref 65–99)
Potassium: 4.7 mmol/L (ref 3.5–5.1)
SODIUM: 141 mmol/L (ref 135–145)
TOTAL PROTEIN: 6.9 g/dL (ref 6.5–8.1)
Total Bilirubin: 0.6 mg/dL (ref 0.3–1.2)

## 2015-08-11 LAB — CBC WITH DIFFERENTIAL/PLATELET
BASOS ABS: 0.1 10*3/uL (ref 0.0–0.1)
Basophils Relative: 1 %
EOS PCT: 4 %
Eosinophils Absolute: 0.3 10*3/uL (ref 0.0–0.7)
HCT: 37 % — ABNORMAL LOW (ref 39.0–52.0)
Hemoglobin: 12.1 g/dL — ABNORMAL LOW (ref 13.0–17.0)
Lymphocytes Relative: 39 %
Lymphs Abs: 2.9 10*3/uL (ref 0.7–4.0)
MCH: 25.7 pg — ABNORMAL LOW (ref 26.0–34.0)
MCHC: 32.7 g/dL (ref 30.0–36.0)
MCV: 78.6 fL (ref 78.0–100.0)
MONO ABS: 0.5 10*3/uL (ref 0.1–1.0)
MONOS PCT: 7 %
Neutro Abs: 3.8 10*3/uL (ref 1.7–7.7)
Neutrophils Relative %: 49 %
PLATELETS: 254 10*3/uL (ref 150–400)
RBC: 4.71 MIL/uL (ref 4.22–5.81)
RDW: 16.2 % — AB (ref 11.5–15.5)
WBC: 7.5 10*3/uL (ref 4.0–10.5)

## 2015-08-11 LAB — URINALYSIS, ROUTINE W REFLEX MICROSCOPIC
Bilirubin Urine: NEGATIVE
Glucose, UA: NEGATIVE mg/dL
Hgb urine dipstick: NEGATIVE
KETONES UR: 15 mg/dL — AB
Leukocytes, UA: NEGATIVE
NITRITE: NEGATIVE
PH: 7.5 (ref 5.0–8.0)
PROTEIN: NEGATIVE mg/dL
Specific Gravity, Urine: 1.02 (ref 1.005–1.030)

## 2015-08-11 NOTE — ED Notes (Addendum)
Pt from a group home with caretaker. Caretaker sts that pt is having hallucinations, depression and lethargy that started yesterday. Caregiver reports that this is not normal behavior for pt. Caretaker adds that pt has been drooling more the past day and has hx of UTI. When askied, pt sts," my pee hurts." Pt in NAD

## 2015-08-11 NOTE — Discharge Instructions (Signed)
As discussed, your evaluation today has been largely reassuring.  But, it is important that you monitor your condition carefully, and do not hesitate to return to the ED if you develop new, or concerning changes in your condition. ? ?Otherwise, please follow-up with your physician for appropriate ongoing care. ? ?

## 2015-08-11 NOTE — ED Provider Notes (Signed)
CSN: [id number]     Arrival date & time 08/11/15  1131 History   First MD Initiated Contact with Patient 08/11/15 1300     Chief Complaint  Patient presents with  . Hallucinations  . Fatigue    HPI  Patient with multiple medical issues including cerebral palsy, bipolar disorder presents with caregiver relates history of decreased interactivity, possible dysuria, and possible increased hallucinatory activity. The patient's HISTORY TO THE HISTORY OF PRESENT ILLNESS, GIVEN HIS HISTORY. LEVEL V CAVEAT. CAREGIVER REPORTS THAT THESE CHANGES ARE PRESENT FOR WEEKS, WITH NO ACUTE CHANGES OVER THE PAST DAYS. CAREGIVER STATES THE PATIENT'S PRIMARY CARE FACILITIES   is only available to him on Tuesday, and as today is Friday, she felt that he required immediate evaluation. Patient himself seemingly denies pain, complaints, but is an unreliable historian.  Past Medical History  Diagnosis Date  . CEREBRAL PALSY 07/24/2006  . GASTRIC ULCER ACUTE WITH HEMORRHAGE 07/24/2006  . URINARY INCONTINENCE 02/09/2010    takes Detrol daily-occasionally incontinence  . History of hiatal hernia   . Environmental allergies     takes Zyrtec daily  . Anxiety     takes Ativan daily as needed  . Constipation     takes Miralax daily as needed  . GERD (gastroesophageal reflux disease)     takes Pravacid daily  . Bipolar disorder (HCC)     takes Depakote daily  . Weakness     numbness in extremities  . Depression     takes Risperdal nightly  . History of blood transfusion     no abnormal reaction noted  . History of shingles   . Hypercholesterolemia   . Pneumonia "quite a few times"    in 2015 and in June 2016  . Chronic bronchitis (HCC)     "gets it q yr"  . History of stomach ulcers ~ 1996  . Recurrent UTI (urinary tract infection)    Past Surgical History  Procedure Laterality Date  . Leg surgery Bilateral ~ 1967    "legs were scissored; stretched them out"  . Head surgery  as a baby    due to  "water head"   . Hernia repair    . Hiatal hernia repair  1982  . Amputation Right 12/14/2014    Procedure: AMPUTATION DIGIT RIGHT GREAT TOE MTP;  Surgeon: Nadara MustardMarcus Duda V, MD;  Location: [facility] OR;  Service: Orthopedics;  Laterality: Right;   Family History  Problem Relation Age of Onset  . Adopted: Yes   Social History  Substance Use Topics  . Smoking status: Never Smoker   . Smokeless tobacco: Never Used  . Alcohol Use: No    Review of Systems  Unable to perform ROS: Patient nonverbal      Allergies  Adhesive  Home Medications   Prior to Admission medications   Medication Sig Start Date End Date Taking? Authorizing Provider  baclofen (LIORESAL) 20 MG tablet Take 1 tablet (20 mg total) by mouth 3 (three) times daily. Patient taking differently: Take 20 mg by mouth 4 (four) times daily.  08/08/15  Yes Erick ColaceAndrew E Kirsteins, MD  cetirizine (ZYRTEC) 10 MG tablet Take 1 tablet (10 mg total) by mouth daily. 11/01/14  Yes Stephanie Couphristopher M Street, MD  divalproex (DEPAKOTE SPRINKLE) 125 MG capsule Take 500 mg by mouth 2 (two) times daily.    Yes Historical Provider, MD  Elastic Bandages & Supports (JOBST ANTI-EM KNEE HIGH MED) MISC by Does not apply route. Apply once in the morning  and remove at bedtime daily   Yes Historical Provider, MD  Foot Care Products (PREVALON HEEL PROTECTOR) MISC 1 Device by Does not apply route daily. 06/20/15  Yes Marquette Saa, MD  Hydrocortisone Acetate 1 % CREA Apply 1 application topically daily. To face, every PM. 06/09/14  Yes Stephanie Coup Street, MD  ketoconazole (NIZORAL) 2 % shampoo USE AS DIRECTED TWICE WEEKLY Patient taking differently: Use as directed twice weekly 07/31/15  Yes Marquette Saa, MD  PREVACID SOLUTAB 30 MG disintegrating tablet DISSOLVE 1 TABLET ON TONGUE EVERY DAY Patient taking differently: Dissolve 1 tablet on tongue every day 04/18/15  Yes Marquette Saa, MD  risperiDONE (RISPERDAL) 2 MG tablet Take 2 mg by mouth  at bedtime.   Yes Historical Provider, MD  silver sulfADIAZINE (SILVADENE) 1 % cream Apply 1 application topically daily. 07/12/15  Yes Marquette Saa, MD  tiZANidine (ZANAFLEX) 4 MG tablet Take 2 mg by mouth 3 (three) times daily.  07/23/15  Yes Historical Provider, MD  tolterodine (DETROL LA) 2 MG 24 hr capsule Take 1 capsule (2 mg total) by mouth daily. 04/08/13  Yes Stephanie Coup Street, MD  tretinoin (RETIN-A) 0.025 % gel Apply 1 applicator topically at bedtime. 04/09/13  Yes Stephanie Coup Street, MD  VOLTAREN 1 % GEL APPLY 2GRAMS TOPICALLY TO BACK FOUR TIMES DAILY FOR BACK PAIN Patient taking differently: Apply 2g topically to back four times a day for back pain 07/28/15  Yes [name], MD  LORazepam (ATIVAN) 1 MG tablet Take 1 mg by mouth every 4 (four) hours as needed for anxiety (anxiety).     Historical Provider, MD  polyethylene glycol powder (GLYCOLAX/MIRALAX) powder Take 17 g by mouth 2 (two) times daily as needed. 11/01/14   Stephanie Coup Street, MD  traMADol (ULTRAM) 50 MG tablet Take 1 tablet (50 mg total) by mouth every 6 (six) hours as needed. For knee or joint pain. 01/09/15   Marquette Saa, MD   BP 135/71 mmHg  Pulse 77  Temp(Src) 97.3 F (36.3 C) (Oral)  Resp 20  SpO2 100% Physical Exam  Constitutional: He has a sickly appearance.  Middle-age male multiple contractures in all extremities essentially nonverbal, minimally interactive  HENT:  Multiple scars, no apparent new deformities  Eyes: Conjunctivae are normal. Right eye exhibits no discharge. Left eye exhibits no discharge.  Neck: No tracheal deviation present.  Cardiovascular: Normal rate and regular rhythm.   Pulmonary/Chest: No stridor. No respiratory distress.  Abdominal: He exhibits no distension. There is no tenderness.  Musculoskeletal:  Contraction in all extremities, no gross new deformity.  Caregiver reports no new changes  Neurological: He is alert. He displays abnormal reflex. A  cranial nerve deficit is present. He exhibits abnormal muscle tone. Coordination abnormal.  Psychiatric:  Minimally interactive, essentially nonverbal  Nursing note and vitals reviewed.   ED Course  Procedures (including critical care time) Labs Review Labs Reviewed  COMPREHENSIVE METABOLIC PANEL - Abnormal; Notable for the following:    Creatinine, Ser 0.51 (*)    Albumin 3.4 (*)    AST 14 (*)    ALT 14 (*)    All other components within normal limits  CBC WITH DIFFERENTIAL/PLATELET - Abnormal; Notable for the following:    Hemoglobin 12.1 (*)    HCT 37.0 (*)    MCH 25.7 (*)    RDW 16.2 (*)    All other components within normal limits  URINALYSIS, ROUTINE W REFLEX MICROSCOPIC (NOT AT [facility]) -  Abnormal; Notable for the following:    Ketones, ur 15 (*)    All other components within normal limits    Imaging Review Dg Chest Portable 1 View  08/11/2015  CLINICAL DATA:  Cough today.  Cerebral palsy. EXAM: PORTABLE CHEST 1 VIEW COMPARISON:  11/17/2014 FINDINGS: Poor positioning limits the examination. Subsegmental atelectasis at the left base. There is dense opacity at the right base. Overall, the findings have improved since the prior study. No pneumothorax. IMPRESSION: Improved left basilar atelectasis and right basilar consolidation. Electronically Signed   By: Jolaine Click M.D.   On: 08/11/2015 14:34   I have personally reviewed and evaluated these images and lab results as part of my medical decision-making.    MDM  Patient cerebral palsy, bipolar disorder presents with weeks of ongoing change in behavior. Here, the patient is awake, alert, though essentially noninteractive. Patient is afebrile, with reassuring labs, no leukocytosis, no fever. Patient does have x-ray evidence of prior pneumonia, but today has clear lung sounds, no hypoxia, and the aforementioned reassuring labs, vital signs. Patient's evaluation here is generally reassuring, with no evidence for acute new  pathology. Patient discharged in stable condition with his caregiver to follow-up with primary care.  Gerhard Munch, MD 08/11/15 [phone]

## 2015-08-11 NOTE — ED Notes (Signed)
MD at bedside. 

## 2015-08-11 NOTE — ED Notes (Signed)
Phlebotomy at bedside.

## 2015-08-11 NOTE — ED Notes (Signed)
Verbalized understanding discharge instructions and follow-up. In no acute distress.   

## 2015-08-11 NOTE — ED Notes (Signed)
Pt reports that he has been feeling "out of it" and sts "it hurts when I cough, but I don't cough a lot."

## 2015-08-11 NOTE — ED Notes (Signed)
Pt's caregiver reports neuro symptoms have been increasing x "a while" and Pt began c/o dysuria yesterday.  Hx of CP.

## 2015-08-28 ENCOUNTER — Telehealth: Payer: Self-pay | Admitting: Internal Medicine

## 2015-08-28 NOTE — Telephone Encounter (Signed)
[facility] and wanted the doctor to know that the patient's wound is red. Her skilled nurse keep changing the bandages but it is now a little red around the area. They wanted to know if the doctor could call in some antibiotics. They would also like the nurse or doctor get with the group home so that the patient can come in for a f/u jw

## 2015-08-30 NOTE — Telephone Encounter (Signed)
Spoke with staff at patient's nursing facility regarding his wound. Explained that patient will need to be seen in clinic for further evaluation of wound before antibiotics can be prescribed. Staff member voiced understanding and said she would call to schedule appointment immediately.   Tarri AbernethyAbigail J Drisana Schweickert, MD PGY-1 [facility] Pager [phone]

## 2015-08-31 ENCOUNTER — Encounter: Payer: Self-pay | Admitting: Family Medicine

## 2015-08-31 ENCOUNTER — Ambulatory Visit (INDEPENDENT_AMBULATORY_CARE_PROVIDER_SITE_OTHER): Payer: Medicare Other | Admitting: Family Medicine

## 2015-08-31 ENCOUNTER — Ambulatory Visit (HOSPITAL_COMMUNITY)
Admission: RE | Admit: 2015-08-31 | Discharge: 2015-08-31 | Disposition: A | Discharge status: Home / Self Care | Payer: Medicare Other | Source: Ambulatory Visit | Attending: Family Medicine | Admitting: Family Medicine

## 2015-08-31 VITALS — BP 110/78 | HR 62 | Temp 97.5°F

## 2015-08-31 DIAGNOSIS — L89520 Pressure ulcer of left ankle, unstageable: Secondary | ICD-10-CM

## 2015-08-31 DIAGNOSIS — M86671 Other chronic osteomyelitis, right ankle and foot: Secondary | ICD-10-CM | POA: Diagnosis not present

## 2015-08-31 DIAGNOSIS — R7309 Other abnormal glucose: Secondary | ICD-10-CM | POA: Diagnosis not present

## 2015-08-31 DIAGNOSIS — G808 Other cerebral palsy: Secondary | ICD-10-CM | POA: Diagnosis not present

## 2015-08-31 DIAGNOSIS — Z981 Arthrodesis status: Secondary | ICD-10-CM | POA: Diagnosis not present

## 2015-08-31 DIAGNOSIS — L89523 Pressure ulcer of left ankle, stage 3: Secondary | ICD-10-CM | POA: Insufficient documentation

## 2015-08-31 DIAGNOSIS — L03116 Cellulitis of left lower limb: Secondary | ICD-10-CM

## 2015-08-31 DIAGNOSIS — M7989 Other specified soft tissue disorders: Secondary | ICD-10-CM | POA: Insufficient documentation

## 2015-08-31 DIAGNOSIS — R7303 Prediabetes: Secondary | ICD-10-CM | POA: Insufficient documentation

## 2015-08-31 LAB — CBC WITH DIFFERENTIAL/PLATELET
BASOS PCT: 1 %
Basophils Absolute: 61 cells/uL (ref 0–200)
EOS PCT: 8 %
Eosinophils Absolute: 488 cells/uL (ref 15–500)
HCT: 36 % — ABNORMAL LOW (ref 38.5–50.0)
Hemoglobin: 11.7 g/dL — ABNORMAL LOW (ref 13.2–17.1)
LYMPHS PCT: 42 %
Lymphs Abs: 2562 cells/uL (ref 850–3900)
MCH: 25.8 pg — ABNORMAL LOW (ref 27.0–33.0)
MCHC: 32.5 g/dL (ref 32.0–36.0)
MCV: 79.3 fL — ABNORMAL LOW (ref 80.0–100.0)
MONOS PCT: 8 %
MPV: 10.8 fL (ref 7.5–12.5)
Monocytes Absolute: 488 cells/uL (ref 200–950)
Neutro Abs: 2501 cells/uL (ref 1500–7800)
Neutrophils Relative %: 41 %
PLATELETS: 229 10*3/uL (ref 140–400)
RBC: 4.54 MIL/uL (ref 4.20–5.80)
RDW: 16.7 % — AB (ref 11.0–15.0)
WBC: 6.1 10*3/uL (ref 3.8–10.8)

## 2015-08-31 LAB — C-REACTIVE PROTEIN: CRP: <0.5 mg/dL (ref <0.60)

## 2015-08-31 LAB — POCT GLYCOSYLATED HEMOGLOBIN (HGB A1C): HEMOGLOBIN A1C: 5.7

## 2015-08-31 MED ORDER — AMOXICILLIN-POT CLAVULANATE 875-125 MG PO TABS: 1.0000 | ORAL_TABLET | Freq: Two times a day (BID) | ORAL | Status: DC

## 2015-08-31 MED ORDER — SULFAMETHOXAZOLE-TRIMETHOPRIM 800-160 MG PO TABS: 1.0000 | ORAL_TABLET | Freq: Two times a day (BID) | ORAL | Status: DC

## 2015-08-31 NOTE — Progress Notes (Addendum)
Subjective:    Patient ID: [name], male    DOB: March 07, 1961, 55 y.o.   MRN: [id number]  [name] is a 55 y.o. male presenting on 08/31/2015 for Toe Pain   Patient presents for a same day appointment. Presents with caregiver from group home.   HPI   CHRONIC LOWER LEG ULCER, Left medial ankle - Chronic history of cerebral palsy with spastic quadriplegia, limited to wheelchair or bedbound, prior history complicated by several pressure ulcers of lower extremities feet / ankles, and known history chronic osteomyelitis of Right Great Toe (s/p amputation in 11/2014) - Currently with symptoms starting 3 days ago with worsening localized redness and itching, without significant pain, of left medial ankle chronic ulcer. For this particular lesion, it has been up to >2 years ago treated with multiple antibiotics, wound care, and reported skin grafts, it had improved until developed superficial ulceration again 05/2015, treated with antibiotics, wound care. Difficult healing due to chronic edema and no mobility. - Currently living in residential facility (group home) and receives wound care once weekly but dressing changes daily - No history of DM, but last testing A1c 5.4 in 2011 - Denied any drainage of pus or foul odor, fevers/chills, spreading redness, cold or numb extremity  Social History  Substance Use Topics  . Smoking status: Never Smoker   . Smokeless tobacco: Never Used  . Alcohol Use: No    Review of Systems Per HPI unless specifically indicated above     Objective:    BP 110/78 mmHg  Pulse 62  Temp(Src) 97.5 F (36.4 C) (Oral)  Wt Readings from Last 3 Encounters:  12/14/14 180 lb (81.647 kg)  12/06/14 180 lb (81.647 kg)  10/19/14 180 lb 8 oz (81.874 kg)    Physical Exam  Constitutional: He appears well-developed and well-nourished. No distress.  Chronically ill but currently well appearing, quadriplegic in specialized wheelchair with legs elevated and reclined,  comfortable and cooperative  HENT:  Mouth/Throat: Oropharynx is clear and moist.  Cardiovascular: Normal rate.   Trace distal lower extremity pulses with +edema.  Pulmonary/Chest: Effort normal.  Musculoskeletal: He exhibits edema (left lower ext +2 pitting edema below knee extending into foot diffusely, unable to examine Right leg in specialized boot).  Neurological: He is alert.  Unable to move extremities quadraplegia. Reduced sensation to light touch in Left foot.  Skin: Skin is warm and dry. No rash noted. He is not diaphoretic. There is erythema (localized to wound, not extending into leg or foot).  Left medial ankle over medial malleolus, pressure ulceration 4x3 cm with superficial granulation tissue and surrounding increased erythema without purulent drainage, no foul odor, no exposed bone or eschar. No significant tenderness.  Nursing note and vitals reviewed.   Pictured is Left ankle, medial aspect over medial malleolus          Results for orders placed or performed in visit on 08/31/15  C-reactive protein  Result Value Ref Range   CRP <0.5 <0.60 mg/dL  CBC with Differential/Platelet  Result Value Ref Range   WBC 6.1 3.8 - 10.8 K/uL   RBC 4.54 4.20 - 5.80 MIL/uL   Hemoglobin 11.7 (L) 13.2 - 17.1 g/dL   HCT 36.0 (L) 38.5 - 50.0 %   MCV 79.3 (L) 80.0 - 100.0 fL   MCH 25.8 (L) 27.0 - 33.0 pg   MCHC 32.5 32.0 - 36.0 g/dL   RDW 16.7 (H) 11.0 - 15.0 %   Platelets 229 140 -  400 K/uL   MPV 10.8 7.5 - 12.5 fL   Neutro Abs 2501 1500 - 7800 cells/uL   Lymphs Abs 2562 850 - 3900 cells/uL   Monocytes Absolute 488 200 - 950 cells/uL   Eosinophils Absolute 488 15 - 500 cells/uL   Basophils Absolute 61 0 - 200 cells/uL   Neutrophils Relative % 41 %   Lymphocytes Relative 42 %   Monocytes Relative 8 %   Eosinophils Relative 8 %   Basophils Relative 1 %   Smear Review Criteria for review not met   Sedimentation Rate  Result Value Ref Range   Sed Rate 5 0 - 20 mm/hr  POCT  glycosylated hemoglobin (Hb A1C)  Result Value Ref Range   Hemoglobin A1C 5.7       Assessment & Plan:   Problem List Items Addressed This Visit    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 No obvious purulent drainage or abscess, but concern with localized cellulitis, complicated by chronic pitting edema, likely stasis and poor healing. - Given chronic h/o osteo R-toe s/p amputation, concern possible underlying osteo in recurrent wound >2 years - Well appearing / baseline, hemodynamically stable, no evidence of systemic infection  Plan: 1. Work-up for potential osteomyelitis - check Left ankle, foot X-rays 2. Labs - ESR, CRP, CBC, A1c 3. Start antibiotics with Bactrim-DS 1 tab BID x 7 days and Augmentin 1 tab BID x 7 days 4. Continue wound care per [place] 5. Return within 1 week, Mon-Tues to see PCP follow-up wound progress on antibiotics, review labs / x-rays, discuss next step, may need formal return to wound center for debridement vs ortho eval again  UPDATE Reviewed all lab results, spoke to caregivers to inform them of X-rays negative for osteo or bony destruction. Additional lab results are not suggestive of underlying osteo ESR 5, CRP < 0.5 (both compared to 9 mo ago with significant elevations), WBC 6.1      Relevant Medications   sulfamethoxazole-trimethoprim (BACTRIM DS,SEPTRA DS) 800-160 MG tablet   amoxicillin-clavulanate (AUGMENTIN) 875-125 MG tablet   Pre-diabetes    Last A1c 5.4 in 2011, concern for DM with recurrent lower ext ulcerations. Check A1c - results 5.7, considered Pre-DM, notified caregivers.      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HCC)    Likely underlying cause of recurrent pressure ulcers on lower ext. Immobility of legs also contributes to edema and poor healing.      Chronic osteomyelitis (HCC)    Prior history of chronic  osteo in Right Great Toe, now s/p amputation 11/2014. No current active area of chronic osteo known, but given history, suspect increased risk for osteo with chronic left ankle ulcer. - Additional work-up for osteo labs, imaging, see A&P left ulcer      Relevant Medications   sulfamethoxazole-trimethoprim (BACTRIM DS,SEPTRA DS) 800-160 MG tablet   Other Relevant Orders   C-reactive protein (Completed)   Cellulitis    Localized cellulitis around left ankle ulceration, recent worsening. - Treat with antibiotics, see Left ulcer A&P       Other Visit Diagnoses    Other abnormal glucose        Relevant Orders    POCT glycosylated hemoglobin (Hb A1C) (Completed)       Meds ordered this encounter  Medications  . sulfamethoxazole-trimethoprim (BACTRIM DS,SEPTRA DS) 800-160 MG tablet    Sig: Take 1  tablet by mouth 2 (two) times daily.    Dispense:  14 tablet    Refill:  0  . amoxicillin-clavulanate (AUGMENTIN) 875-125 MG tablet    Sig: Take 1 tablet by mouth 2 (two) times daily.    Dispense:  14 tablet    Refill:  0    Follow up plan: Return in about 1 week (around 09/07/2015) for left ankle ulcer, infection, lab/x-ray results.  Nobie Putnam, [place], PGY-3

## 2015-08-31 NOTE — Patient Instructions (Signed)
Thank you for coming in to clinic today.  1. Start with antibiotics - two different ones, twice a day for 7 days - Check blood work and Left ankle/foot X-rays [facility] anytime Monday through Friday business hours, walk in no appointment needed - Check blood work to see for more serious chronic infection - Continue same wound care as you are, follow-up with wound care nursing next week  If symptoms worsen, spreading redness, fevers, nausea, vomiting, or worsening symptoms, may need to go directly to ED.  Please schedule a follow-up appointment with Dr Natale MilchLancaster or myself early next week to follow-up Left Ankle Ulcer infection  If you have any other questions or concerns, please feel free to call the clinic to contact me. You may also schedule an earlier appointment if necessary.  However, if your symptoms get significantly worse, please go to the Emergency Department to seek immediate medical attention.  Saralyn PilarAlexander Karamalegos, DO [facility]

## 2015-09-01 LAB — SEDIMENTATION RATE: Sed Rate: 5 mm/hr (ref 0–20)

## 2015-09-01 NOTE — Assessment & Plan Note (Signed)
Localized cellulitis around left ankle ulceration, recent worsening. - Treat with antibiotics, see Left ulcer A&P

## 2015-09-01 NOTE — Assessment & Plan Note (Signed)
Last A1c 5.4 in 2011, concern for DM with recurrent lower ext ulcerations. Check A1c - results 5.7, considered Pre-DM, notified caregivers.

## 2015-09-01 NOTE — Assessment & Plan Note (Signed)
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 No current active area of chronic osteo known, but given history, suspect increased risk for osteo with chronic left ankle ulcer. - Additional work-up for osteo labs, imaging, see A&P left ulcer

## 2015-09-01 NOTE — Assessment & Plan Note (Addendum)
Acute worsening on chronic Left ankle medial malleolus pressure ulcer, likely stage-II-III with extensive granulation tissue. No obvious purulent drainage or abscess, but concern with localized cellulitis, complicated by chronic pitting edema, likely stasis and poor healing. - Given chronic h/o osteo R-toe s/p amputation, concern possible underlying osteo in recurrent wound >2 years - Well appearing / baseline, hemodynamically stable, no evidence of systemic infection  Plan: 1. Work-up for potential osteomyelitis - check Left ankle, foot X-rays 2. Labs - ESR, CRP, CBC, A1c 3. Start antibiotics with Bactrim-DS 1 tab BID x 7 days and Augmentin 1 tab BID x 7 days 4. Continue wound care per [place] 5. Return within 1 week, Mon-Tues to see PCP follow-up wound progress on antibiotics, review labs / x-rays, discuss next step, may need formal return to wound center for debridement vs ortho eval again  UPDATE Reviewed all lab results, spoke to caregivers to inform them of X-rays negative for osteo or bony destruction. Additional lab results are not suggestive of underlying osteo ESR 5, CRP < 0.5 (both compared to 9 mo ago with significant elevations), WBC 6.1

## 2015-09-01 NOTE — Assessment & Plan Note (Signed)
Likely underlying cause of recurrent pressure ulcers on lower ext. Immobility of legs also contributes to edema and poor healing.

## 2015-09-05 ENCOUNTER — Ambulatory Visit (INDEPENDENT_AMBULATORY_CARE_PROVIDER_SITE_OTHER): Payer: Medicare Other | Admitting: Internal Medicine

## 2015-09-05 ENCOUNTER — Encounter: Payer: Self-pay | Admitting: Internal Medicine

## 2015-09-05 DIAGNOSIS — M86671 Other chronic osteomyelitis, right ankle and foot: Secondary | ICD-10-CM

## 2015-09-05 NOTE — Assessment & Plan Note (Signed)
Patient not actually seen in clinic today. Caregiver arrived at appointment instead. Improving per caregiver and wound care nurse report.  - Continue antibiotics - Continue with wound care nurse

## 2015-09-05 NOTE — Progress Notes (Signed)
Patient's caretaker arrived at clinic. Patient apparently was not picked up by the bus that was scheduled to bring him to his appointment today. Spoke with caretaker regarding results of patient's labs drawn at visit with Dr. Althea CharonKaramalegos at appointment last week. Informed her that inflammatory markers were negative, and patient did not have an elevated WBC count. Also informed caretaker that patient's A1C was 5.7. Caretaker said that wound care nurse came to see patient yesterday, and remarked that his wound appears improved from last week. He continues to take the antibiotics prescribed last week and denies any side effects.

## 2015-09-11 ENCOUNTER — Telehealth: Payer: Self-pay | Admitting: Internal Medicine

## 2015-09-11 NOTE — Telephone Encounter (Signed)
Need verbal or written orders for continued services for patient.  Left ankle wound still not healed.  Please call or fax [phone]  Attn: Herbert SetaHeather.

## 2015-09-12 NOTE — Telephone Encounter (Signed)
Spoke with Herbert SetaHeather from [facility] regarding continued wound care services for patient. Gave verbal orders for continued wound care services. Herbert SetaHeather reports that patient's ankle wound is decreasing in size after completing course of antibiotics, but she feels he still needs care to ensure it continues to heal. Encouraged Heather to schedule another appointment for patient if the wound does not continue to improve or worsens again. Heather voiced understanding.   Tarri AbernethyAbigail J Lancaster, MD PGY-1 [facility] Pager [phone]

## 2015-09-12 NOTE — Telephone Encounter (Signed)
Heather from [facility] is calling back to see if the doctor had approved the verbal orders for the patient ankle wound that is not healed. Please call her at [phone] and if she doesn't answer you can leave the orders on the voice since it is HIPPA protected. jw

## 2015-10-02 ENCOUNTER — Other Ambulatory Visit: Payer: Self-pay | Admitting: Internal Medicine

## 2015-10-16 ENCOUNTER — Telehealth: Payer: Self-pay | Admitting: Internal Medicine

## 2015-10-16 NOTE — Telephone Encounter (Signed)
[facility] nurse calls, states that during home visit today, she noticed that the ulcer on pt's leg is becoming more red again. She would like Dr. Natale MilchLancaster to be advise of this.

## 2015-10-17 NOTE — Telephone Encounter (Signed)
Spoke with patient's home health nurse [name](Heather) regarding her concerns that his chronic LE ulcer is worsening. She noticed yesterday that it was redder than usual, and was also told by staff at his group home that he hit the leg on a door frame recently. Recommended that patient be seen for further evaluation of the wound, and scheduled appt with Dr. Pollie MeyerMcIntyre on 5/25. Herbert SetaHeather said she would call his group home right away to let them know of the appointment.   Tarri AbernethyAbigail J Shaianne Nucci, MD PGY-1 [facility] Pager [phone]

## 2015-10-19 ENCOUNTER — Emergency Department (HOSPITAL_BASED_OUTPATIENT_CLINIC_OR_DEPARTMENT_OTHER): Payer: Medicare Other

## 2015-10-19 ENCOUNTER — Encounter: Payer: Self-pay | Admitting: Family Medicine

## 2015-10-19 ENCOUNTER — Observation Stay (HOSPITAL_COMMUNITY)
Admission: EM | Admit: 2015-10-19 | Discharge: 2015-10-22 | Disposition: A | Discharge status: Home Health | Payer: Medicare Other | Attending: Family Medicine | Admitting: Family Medicine

## 2015-10-19 ENCOUNTER — Ambulatory Visit (INDEPENDENT_AMBULATORY_CARE_PROVIDER_SITE_OTHER): Payer: Medicare Other | Admitting: Family Medicine

## 2015-10-19 ENCOUNTER — Emergency Department (HOSPITAL_COMMUNITY): Payer: Medicare Other

## 2015-10-19 ENCOUNTER — Encounter (HOSPITAL_COMMUNITY): Payer: Self-pay

## 2015-10-19 VITALS — BP 102/57 | HR 72 | Temp 96.3°F

## 2015-10-19 DIAGNOSIS — G809 Cerebral palsy, unspecified: Secondary | ICD-10-CM | POA: Diagnosis not present

## 2015-10-19 DIAGNOSIS — Z993 Dependence on wheelchair: Secondary | ICD-10-CM | POA: Diagnosis not present

## 2015-10-19 DIAGNOSIS — X58XXXA Exposure to other specified factors, initial encounter: Secondary | ICD-10-CM | POA: Insufficient documentation

## 2015-10-19 DIAGNOSIS — S82302A Unspecified fracture of lower end of left tibia, initial encounter for closed fracture: Secondary | ICD-10-CM | POA: Diagnosis present

## 2015-10-19 DIAGNOSIS — Z89411 Acquired absence of right great toe: Secondary | ICD-10-CM | POA: Diagnosis not present

## 2015-10-19 DIAGNOSIS — M7989 Other specified soft tissue disorders: Secondary | ICD-10-CM

## 2015-10-19 DIAGNOSIS — G808 Other cerebral palsy: Secondary | ICD-10-CM | POA: Diagnosis present

## 2015-10-19 DIAGNOSIS — Z66 Do not resuscitate: Secondary | ICD-10-CM | POA: Diagnosis not present

## 2015-10-19 DIAGNOSIS — F419 Anxiety disorder, unspecified: Secondary | ICD-10-CM | POA: Diagnosis not present

## 2015-10-19 DIAGNOSIS — L8990 Pressure ulcer of unspecified site, unspecified stage: Secondary | ICD-10-CM | POA: Diagnosis not present

## 2015-10-19 DIAGNOSIS — Z7401 Bed confinement status: Secondary | ICD-10-CM | POA: Insufficient documentation

## 2015-10-19 DIAGNOSIS — L0390 Cellulitis, unspecified: Secondary | ICD-10-CM

## 2015-10-19 DIAGNOSIS — R609 Edema, unspecified: Secondary | ICD-10-CM | POA: Diagnosis not present

## 2015-10-19 DIAGNOSIS — K219 Gastro-esophageal reflux disease without esophagitis: Secondary | ICD-10-CM | POA: Diagnosis not present

## 2015-10-19 DIAGNOSIS — G800 Spastic quadriplegic cerebral palsy: Secondary | ICD-10-CM | POA: Insufficient documentation

## 2015-10-19 DIAGNOSIS — L03116 Cellulitis of left lower limb: Secondary | ICD-10-CM | POA: Diagnosis not present

## 2015-10-19 DIAGNOSIS — I83023 Varicose veins of left lower extremity with ulcer of ankle: Secondary | ICD-10-CM | POA: Diagnosis not present

## 2015-10-19 DIAGNOSIS — M86671 Other chronic osteomyelitis, right ankle and foot: Secondary | ICD-10-CM | POA: Insufficient documentation

## 2015-10-19 DIAGNOSIS — L89523 Pressure ulcer of left ankle, stage 3: Secondary | ICD-10-CM | POA: Diagnosis not present

## 2015-10-19 DIAGNOSIS — S82309A Unspecified fracture of lower end of unspecified tibia, initial encounter for closed fracture: Secondary | ICD-10-CM | POA: Diagnosis not present

## 2015-10-19 DIAGNOSIS — E785 Hyperlipidemia, unspecified: Secondary | ICD-10-CM | POA: Diagnosis not present

## 2015-10-19 DIAGNOSIS — L03211 Cellulitis of face: Secondary | ICD-10-CM | POA: Diagnosis not present

## 2015-10-19 DIAGNOSIS — L97309 Non-pressure chronic ulcer of unspecified ankle with unspecified severity: Secondary | ICD-10-CM

## 2015-10-19 DIAGNOSIS — I83003 Varicose veins of unspecified lower extremity with ulcer of ankle: Secondary | ICD-10-CM | POA: Diagnosis present

## 2015-10-19 DIAGNOSIS — R7303 Prediabetes: Secondary | ICD-10-CM | POA: Diagnosis not present

## 2015-10-19 DIAGNOSIS — L0291 Cutaneous abscess, unspecified: Secondary | ICD-10-CM | POA: Diagnosis present

## 2015-10-19 DIAGNOSIS — S82202A Unspecified fracture of shaft of left tibia, initial encounter for closed fracture: Principal | ICD-10-CM | POA: Insufficient documentation

## 2015-10-19 DIAGNOSIS — F319 Bipolar disorder, unspecified: Secondary | ICD-10-CM | POA: Insufficient documentation

## 2015-10-19 DIAGNOSIS — S82243A Displaced spiral fracture of shaft of unspecified tibia, initial encounter for closed fracture: Secondary | ICD-10-CM | POA: Diagnosis present

## 2015-10-19 LAB — CBC WITH DIFFERENTIAL/PLATELET
Basophils Absolute: 0 10*3/uL (ref 0.0–0.1)
Basophils Relative: 1 %
Eosinophils Absolute: 0.2 10*3/uL (ref 0.0–0.7)
Eosinophils Relative: 3 %
HCT: 37.4 % — ABNORMAL LOW (ref 39.0–52.0)
Hemoglobin: 11.5 g/dL — ABNORMAL LOW (ref 13.0–17.0)
LYMPHS ABS: 2.5 10*3/uL (ref 0.7–4.0)
LYMPHS PCT: 37 %
MCH: 25.4 pg — AB (ref 26.0–34.0)
MCHC: 30.7 g/dL (ref 30.0–36.0)
MCV: 82.6 fL (ref 78.0–100.0)
MONO ABS: 0.5 10*3/uL (ref 0.1–1.0)
Monocytes Relative: 8 %
Neutro Abs: 3.5 10*3/uL (ref 1.7–7.7)
Neutrophils Relative %: 51 %
PLATELETS: 295 10*3/uL (ref 150–400)
RBC: 4.53 MIL/uL (ref 4.22–5.81)
RDW: 16.8 % — ABNORMAL HIGH (ref 11.5–15.5)
WBC: 6.7 10*3/uL (ref 4.0–10.5)

## 2015-10-19 LAB — COMPREHENSIVE METABOLIC PANEL
ALT: 19 U/L (ref 17–63)
AST: 14 U/L — AB (ref 15–41)
Albumin: 2.8 g/dL — ABNORMAL LOW (ref 3.5–5.0)
Alkaline Phosphatase: 171 U/L — ABNORMAL HIGH (ref 38–126)
Anion gap: 6 (ref 5–15)
BILIRUBIN TOTAL: 0.6 mg/dL (ref 0.3–1.2)
BUN: 17 mg/dL (ref 6–20)
CALCIUM: 9.2 mg/dL (ref 8.9–10.3)
CO2: 29 mmol/L (ref 22–32)
CREATININE: 0.71 mg/dL (ref 0.61–1.24)
Chloride: 108 mmol/L (ref 101–111)
GFR calc Af Amer: >60 mL/min (ref >60)
Glucose, Bld: 90 mg/dL (ref 65–99)
POTASSIUM: 3.6 mmol/L (ref 3.5–5.1)
Sodium: 143 mmol/L (ref 135–145)
TOTAL PROTEIN: 6.3 g/dL — AB (ref 6.5–8.1)

## 2015-10-19 LAB — CBC
HEMATOCRIT: 32.4 % — AB (ref 39.0–52.0)
HEMOGLOBIN: 10.1 g/dL — AB (ref 13.0–17.0)
MCH: 25.7 pg — ABNORMAL LOW (ref 26.0–34.0)
MCHC: 31.2 g/dL (ref 30.0–36.0)
MCV: 82.4 fL (ref 78.0–100.0)
Platelets: 281 10*3/uL (ref 150–400)
RBC: 3.93 MIL/uL — AB (ref 4.22–5.81)
RDW: 16.5 % — AB (ref 11.5–15.5)
WBC: 6.8 10*3/uL (ref 4.0–10.5)

## 2015-10-19 LAB — CREATININE, SERUM: Creatinine, Ser: 0.56 mg/dL — ABNORMAL LOW (ref 0.61–1.24)

## 2015-10-19 LAB — MAGNESIUM: MAGNESIUM: 2 mg/dL (ref 1.7–2.4)

## 2015-10-19 LAB — TSH: TSH: 5.021 u[IU]/mL — AB (ref 0.350–4.500)

## 2015-10-19 LAB — I-STAT CG4 LACTIC ACID, ED
LACTIC ACID, VENOUS: 1.14 mmol/L (ref 0.5–2.0)
Lactic Acid, Venous: 1.75 mmol/L (ref 0.5–2.0)

## 2015-10-19 LAB — PHOSPHORUS: Phosphorus: 3.3 mg/dL (ref 2.5–4.6)

## 2015-10-19 MED ORDER — PROMETHAZINE HCL 25 MG PO TABS: 12.5000 mg | ORAL_TABLET | Freq: Four times a day (QID) | ORAL | Status: DC | PRN

## 2015-10-19 MED ORDER — POLYETHYLENE GLYCOL 3350 17 GM/SCOOP PO POWD
17.0000 g | Freq: Two times a day (BID) | ORAL | Status: DC | PRN
Start: 2015-10-19 — End: 2015-10-19

## 2015-10-19 MED ORDER — DOXYCYCLINE HYCLATE 100 MG PO TABS
100.0000 mg | ORAL_TABLET | Freq: Two times a day (BID) | ORAL | Status: DC
  Administered 2015-10-19 – 2015-10-22 (×6): 100 mg via ORAL
  Filled 2015-10-19 (×6): qty 1

## 2015-10-19 MED ORDER — TRAMADOL HCL 50 MG PO TABS
50.0000 mg | ORAL_TABLET | Freq: Four times a day (QID) | ORAL | Status: DC | PRN
  Administered 2015-10-19 – 2015-10-22 (×6): 50 mg via ORAL
  Filled 2015-10-19 (×6): qty 1

## 2015-10-19 MED ORDER — LORAZEPAM 1 MG PO TABS
1.0000 mg | ORAL_TABLET | ORAL | Status: DC | PRN
  Administered 2015-10-19 – 2015-10-22 (×2): 1 mg via ORAL
  Filled 2015-10-19 (×2): qty 1

## 2015-10-19 MED ORDER — POLYETHYLENE GLYCOL 3350 17 G PO PACK: 17.0000 g | PACK | Freq: Two times a day (BID) | ORAL | Status: DC | PRN

## 2015-10-19 MED ORDER — TRAMADOL HCL 50 MG PO TABS: 50.0000 mg | ORAL_TABLET | Freq: Four times a day (QID) | ORAL | Status: DC | PRN

## 2015-10-19 MED ORDER — SODIUM CHLORIDE 0.9% FLUSH
3.0000 mL | Freq: Two times a day (BID) | INTRAVENOUS | Status: DC
  Administered 2015-10-19 – 2015-10-20 (×3): 3 mL via INTRAVENOUS

## 2015-10-19 MED ORDER — ACETAMINOPHEN 650 MG RE SUPP: 650.0000 mg | Freq: Four times a day (QID) | RECTAL | Status: DC | PRN

## 2015-10-19 MED ORDER — ENOXAPARIN SODIUM 40 MG/0.4ML ~~LOC~~ SOLN
40.0000 mg | SUBCUTANEOUS | Status: DC
  Administered 2015-10-19 – 2015-10-21 (×3): 40 mg via SUBCUTANEOUS
  Filled 2015-10-19 (×3): qty 0.4

## 2015-10-19 MED ORDER — TIZANIDINE HCL 4 MG PO TABS
2.0000 mg | ORAL_TABLET | Freq: Three times a day (TID) | ORAL | Status: DC
  Administered 2015-10-19 – 2015-10-22 (×9): 2 mg via ORAL
  Filled 2015-10-19 (×10): qty 1

## 2015-10-19 MED ORDER — LORATADINE 10 MG PO TABS
10.0000 mg | ORAL_TABLET | Freq: Every day | ORAL | Status: DC
  Administered 2015-10-20 – 2015-10-22 (×3): 10 mg via ORAL
  Filled 2015-10-19 (×3): qty 1

## 2015-10-19 MED ORDER — RISPERIDONE 0.5 MG PO TABS
2.0000 mg | ORAL_TABLET | Freq: Every day | ORAL | Status: DC
  Administered 2015-10-19 – 2015-10-21 (×3): 2 mg via ORAL
  Filled 2015-10-19 (×3): qty 4

## 2015-10-19 MED ORDER — BACLOFEN 10 MG PO TABS
20.0000 mg | ORAL_TABLET | Freq: Four times a day (QID) | ORAL | Status: DC
  Administered 2015-10-19 – 2015-10-22 (×9): 20 mg via ORAL
  Filled 2015-10-19 (×9): qty 2

## 2015-10-19 MED ORDER — SODIUM CHLORIDE 0.9% FLUSH: 3.0000 mL | INTRAVENOUS | Status: DC | PRN

## 2015-10-19 MED ORDER — SODIUM CHLORIDE 0.9 % IV SOLN
INTRAVENOUS | Status: DC
  Administered 2015-10-19: 23:00:00 via INTRAVENOUS

## 2015-10-19 MED ORDER — TRETINOIN 0.025 % EX GEL: 1.0000 | Freq: Every day | CUTANEOUS | Status: DC

## 2015-10-19 MED ORDER — ACETAMINOPHEN 325 MG PO TABS
650.0000 mg | ORAL_TABLET | Freq: Four times a day (QID) | ORAL | Status: DC | PRN
Start: 2015-10-19 — End: 2015-10-22

## 2015-10-19 MED ORDER — SODIUM CHLORIDE 0.9 % IV SOLN: 250.0000 mL | INTRAVENOUS | Status: DC | PRN

## 2015-10-19 MED ORDER — DIVALPROEX SODIUM 125 MG PO CSDR
500.0000 mg | DELAYED_RELEASE_CAPSULE | Freq: Two times a day (BID) | ORAL | Status: DC
Start: 2015-10-19 — End: 2015-10-22
  Administered 2015-10-19 – 2015-10-22 (×6): 500 mg via ORAL
  Filled 2015-10-19 (×6): qty 4

## 2015-10-19 NOTE — Progress Notes (Signed)
Date of Visit: 10/19/2015   HPI:  Has had chronic ulcer of left foot, seen many times for it. [facility] and underwent evaluation for osteomyelitis. Has negative inflammatory markers, and xray which was not suggestive of osteo.  Had a home health wound nurse evaluate the wound yesterday and she asked that patient come in for evaluation and likely antibiotics as it appeared infected.  Has had pain and swelling in left leg. On Thursday or Friday of last week he hit his leg on a doorway accidentally and has had pain ever since. Tried heating pad, ice pack, tylenol without relief.   Denies fever, shortness of breath, chest pain.   ROS: See HPI.  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 Approximately 3cm unstageable ulcer present on medial L ankle with surrounding erythema and warmth. Wet slough present on ulcer. Pain with any movement of the leg including to palpation.           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 Did not attempt to probe to bone, but concern continues for possible underlying osteomyelitis. Fortunately vitals are stable and patient is without systemic signs/sx.  I am also worried about possible fracture of left leg/knee given the trauma he recently experienced and his continued pain up higher in his leg. Significant swelling and warmth in calf raises concern for possible DVT as well. With all this in mind, the most prudent and expedited workup will be for him to go to the ER for evaluation. Discussed with patient and his caregiver, who are both in  agreement. Called ED charge nurse and informed them of patient's pending arrival.   FOLLOW UP: Going directly to ED for further eval  [place] [facility]. Pollie MeyerMcIntyre, MD [facility]

## 2015-10-19 NOTE — Patient Instructions (Signed)
Please go down to the ER to have your leg evaluated I'm worried about the infected ulcer It is obviously infected and needs antibiotics But also may be deeper into the bone Also worried about fracture or blood clot in your leg  Be well, Dr. Pollie MeyerMcIntyre

## 2015-10-19 NOTE — ED Notes (Signed)
Attempted report 

## 2015-10-19 NOTE — Progress Notes (Signed)
Orthopedic Tech Progress Note Patient Details:  [name] 1961-04-05 [id number] Applied fiberglass posterior short leg splint with fiberglass stirrup splint to LLE.  Pulses, sensation, motion intact before and after splinting.  Capillary refill less than 2 seconds before and after splinting. Ortho Devices Type of Ortho Device: Short leg splint, Stirrup splint Ortho Device/Splint Location: LLE Ortho Device/Splint Interventions: Application   Lesle ChrisGilliland, Kendan Cornforth L 10/19/2015, 7:47 PM

## 2015-10-19 NOTE — ED Notes (Signed)
Pt presents with 1 week h/o L leg swelling and pain; pt reports striking leg on an object while in electric wheelchair at day care.  Care taker reports pt was seen by nurse on Monday with open wound to inner aspect to wound on ankle but swelling was not as bad then.

## 2015-10-19 NOTE — ED Notes (Signed)
Ortho tech at he bedside. 

## 2015-10-19 NOTE — H&P (Signed)
Family Medicine Teaching [facility] Admission History and Physical Service Pager: [phone]  Patient name: [name] Medical record number: [id number] Date of birth: [birth year] Age: 55 y.o. Gender: male  Primary Care Provider: Tarri AbernethyAbigail J Lancaster, MD Consultants: None Code Status: DNR (per discussion on admission)  Chief Complaint: right leg pain  Assessment and Plan: [name] is a 55 y.o. male presenting with increasing left leg pain . PMH is significant for cerebral palsy with mental handicap, HLD, urinary incontinence, chronic left leg ulcer  Left leg pain/swelling: Combination of worsening left foot cellulitis likely from left medial ankle ulcer and newly discovered spiral fracture of the distal tibial diaphysis. 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 Unlikely DVT as venous duplex prelim results negative. X-rays negative for osteomyelitis. VSS, afebrile. WBC: 11.5. LA 1.75>1.14 - Admit to FPTS, attending Dr. McDiarmid - Vital signs per floor protocol - Can see orthopedics outpatient for fracture, will splint for now - Doxycycline 100 mg q12  - Pain control: Tramadol 50 mg q6 PRN - Wound care consult  Left tibial spiral fracture - closed fracture - Nonweightbearing - splint to be applied - f/u with ortho as an outpatient.  - monitor for compartment syndrome if increasing pain with swelling and splint.   Cerebral palsy: Spastic quadriplegia, limited to wheelchair or bedbound - continue home muscle relaxant regimen of baclofen+tizanidine - continue home lorazepam - continue home depakote 500mg  BID - continue home risperdal  Anxiety - continue ativan 1mg  q4hr as needed for anxiety  Social: Currently living in group home, has a caretaker at bedside  FEN/GI: SLIV, regular diet Prophylaxis: Lovenox  Disposition: Admit to med  surg, attending Dr. McDiarmid  History of Present Illness:  [name] is a 55 y.o. male presenting with left leg pain.   Patient states his left leg pain all started a couple days ago when he accidentally ran his wheelchair into a door at his group home, his left leg was hit and he heard a "pop" and instantly had pain. Patient is wheelchair bound.   He has had chronic left medial ankle ulcer which he has been on antibiotics for intermittently. Most recently he has been on Augmentin, him and his caretaker and unsure of when that was stopped. He has had notable increasing swelling, erythema, and pain around the ulceration site.   Denies fevers, chills, nausea, vomiting, abdominal pain, diarrhea, chest pain, or dyspnea.    Review Of Systems: Per HPI with the following additions: see HPI Otherwise the remainder of the systems were negative.  Patient Active Problem List   Diagnosis Date Noted  . 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 Pre-diabetes 08/31/2015  . Cellulitis 06/08/2015  . Foot ulcer, limited to breakdown of skin (HCC) 06/08/2015  . Osteomyelitis of toe of right foot (HCC) 12/14/2014  . Constipation 11/01/2014  . Seasonal allergies 11/01/2014  . Chronic osteomyelitis (HCC)   . Toe pain, right   . Abdominal distension   . Abdominal pain   . Colitis 10/15/2014  . Pancolitis (HCC) 10/15/2014  . Cough 10/14/2014  . Pressure ulcer of foot 03/14/2014  . Knee pain 11/23/2013  . Healthcare maintenance 06/24/2013  . 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 Onychomycosis 01/13/2013  . Neuropathy (HCC) 01/13/2013  . Pressure ulcer 01/08/2013  . 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 Medication monitoring encounter  07/10/2011  . Hyperlipidemia 07/02/2010  . RENAL GLYCOSURIA 02/26/2010  . URINARY INCONTINENCE 02/09/2010  . 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 ECZEMA 06/09/2008  . 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 Mental retardation 07/24/2006  .  Infantile cerebral palsy (HCC) 07/24/2006  . GASTRIC ULCER ACUTE WITH HEMORRHAGE 07/24/2006    Past Medical History: Past Medical History  Diagnosis Date  . CEREBRAL PALSY 07/24/2006  . GASTRIC ULCER ACUTE WITH HEMORRHAGE 07/24/2006  . URINARY INCONTINENCE 02/09/2010    takes Detrol daily-occasionally incontinence  . History of hiatal hernia   . Environmental allergies     takes Zyrtec daily  . Anxiety     takes Ativan daily as needed  . Constipation     takes Miralax daily as needed  . GERD (gastroesophageal reflux disease)     takes Pravacid daily  . Bipolar disorder (HCC)     takes Depakote daily  . Weakness     numbness in extremities  . Depression     takes Risperdal nightly  . History of blood transfusion     no abnormal reaction noted  . History of shingles   . Hypercholesterolemia   . Pneumonia "quite a few times"    in 2015 and in June 2016  . Chronic bronchitis (HCC)     "gets it q yr"  . History of stomach ulcers ~ 1996  . Recurrent UTI (urinary tract infection)     Past Surgical History: Past Surgical History  Procedure Laterality Date  . Leg surgery Bilateral ~ 1967    "legs were scissored; stretched them out"  . Head surgery  as a baby    due to "water head"   . Hernia repair    . Hiatal hernia repair  1982  . Amputation Right 12/14/2014    Procedure: AMPUTATION DIGIT RIGHT GREAT TOE MTP;  Surgeon: Nadara Mustard, MD;  Location: [facility] OR;  Service: Orthopedics;  Laterality: Right;    Social History: Social History  Substance Use Topics  . Smoking status: Never Smoker   . Smokeless tobacco: Never Used  . Alcohol Use: No   Additional social history: none Please also refer to relevant sections of EMR.  Family History: Family History  Problem Relation Age of Onset  . Adopted: Yes    Allergies and Medications: Allergies  Allergen Reactions  . Adhesive [Tape] Rash   No current facility-administered medications on file prior to encounter.    Current Outpatient Prescriptions on File Prior to Encounter  Medication Sig Dispense Refill  . baclofen (LIORESAL) 20 MG tablet Take 1 tablet (20 mg total) by mouth 3 (three) times daily. (Patient taking differently: Take 20 mg by mouth 4 (four) times daily. ) 90 each 2  . cetirizine (ZYRTEC) 10 MG tablet Take 1 tablet (10 mg total) by mouth daily. 30 tablet 3  . divalproex (DEPAKOTE SPRINKLE) 125 MG capsule Take 500 mg by mouth 2 (two) times daily.     . Hydrocortisone Acetate 1 % CREA Apply 1 application topically daily. To face, every PM. 30 g 2  . ketoconazole (NIZORAL) 2 % shampoo USE AS DIRECTED TWICE WEEKLY (Patient taking differently: Use as directed twice weekly) 120 mL 4  . PREVACID SOLUTAB 30 MG disintegrating tablet DISSOLVE 1 TABLET ON TONGUE EVERY DAY 30 tablet 4  . risperiDONE (RISPERDAL) 2 MG tablet Take 2 mg by mouth at bedtime.    . sulfamethoxazole-trimethoprim (BACTRIM DS,SEPTRA DS) 800-160 MG tablet Take 1 tablet by mouth 2 (  two) times daily. 14 tablet 0  . tiZANidine (ZANAFLEX) 4 MG tablet Take 2 mg by mouth 3 (three) times daily.     Marland Kitchen tolterodine (DETROL LA) 2 MG 24 hr capsule Take 1 capsule (2 mg total) by mouth daily. 90 capsule 3  . tretinoin (RETIN-A) 0.025 % gel Apply 1 applicator topically at bedtime. 45 g 2  . amoxicillin-clavulanate (AUGMENTIN) 875-125 MG tablet Take 1 tablet by mouth 2 (two) times daily. 14 tablet 0  . Elastic Bandages & Supports (JOBST ANTI-EM KNEE HIGH MED) MISC by Does not apply route. Apply once in the morning and remove at bedtime daily    . Foot Care Products (PREVALON HEEL PROTECTOR) MISC 1 Device by Does not apply route daily. (Patient not taking: Reported on 10/19/2015) 2 each 0  . LORazepam (ATIVAN) 1 MG tablet Take 1 mg by mouth every 4 (four) hours as needed for anxiety (anxiety).     . polyethylene glycol powder (GLYCOLAX/MIRALAX) powder Take 17 g by mouth 2 (two) times daily as needed. (Patient taking differently: Take 17 g by mouth  2 (two) times daily as needed (constipation). ) 3350 g 1  . silver sulfADIAZINE (SILVADENE) 1 % cream Apply 1 application topically daily. 50 g 2  . VOLTAREN 1 % GEL APPLY 2GRAMS TOPICALLY TO BACK FOUR TIMES DAILY FOR BACK PAIN (Patient taking differently: Apply 2g topically to back four times a day for back pain) 100 g 4  . [DISCONTINUED] tolterodine (DETROL LA) 2 MG 24 hr capsule Take 1 capsule (2 mg total) by mouth daily. 90 capsule 3    Objective: BP 121/49 mmHg  Pulse 79  Temp(Src) 97.5 F (36.4 C) (Rectal)  Resp 18  Ht 6' (1.829 m)  Wt 175 lb (79.379 kg)  BMI 23.73 kg/m2  SpO2 98% Exam: General: In NAD, laying on right side in bed with left leg surrounded by pillows Eyes: PERRLA, nystagmus present ENTM: moist mucous membranes, poor dentition Neck: supple, no lymphadenopathy Cardiovascular: RRR, normal s1 and s2, 2+ pitting edema of left foot Respiratory: CTAB, normal effort Abdomen: soft, non distended, non tender MSK: Tender to palpation of LLE, bilateral lower extremity muscle wasting Skin: erythema of left foot extending to ankle, medial posterior ankle ulcer on left Neuro: Alert and oriented, unable to move bilateral lower extremities, has cerebral palsy Psych: Normal mood and affect    Labs and Imaging: CBC BMET   Recent Labs Lab 10/19/15 1102  WBC 6.7  HGB 11.5*  HCT 37.4*  PLT 295    Recent Labs Lab 10/19/15 1102  NA 143  K 3.6  CL 108  CO2 29  BUN 17  CREATININE 0.71  GLUCOSE 90  CALCIUM 9.2     Dg Tibia/fibula Left  10/19/2015  CLINICAL DATA:  Lower leg swelling with ulceration over the medial malleolus. EXAM: LEFT TIBIA AND FIBULA - 2 VIEW COMPARISON:  Radiographs 08/31/2015 FINDINGS: The bones are demineralized. There is a spiral fracture of the distal tibial diaphysis with 6 mm of posterior displacement. The fibula appears intact. There is no dislocation or bone destruction. Postsurgical changes are present in the hindfoot status post subtalar  arthrodesis. There is generalized soft tissue swelling/ edema without evidence of foreign body or definite soft tissue emphysema. IMPRESSION: 1. Spiral fracture of the distal tibial diaphysis. 2. No radiographic evidence of osteomyelitis. Electronically Signed   By: Carey Bullocks M.D.   On: 10/19/2015 12:09   Dg Foot Complete Left  10/19/2015  CLINICAL DATA:  Lower leg swelling with ulceration over the medial malleolus. EXAM: LEFT FOOT - COMPLETE 3+ VIEW COMPARISON:  Foot radiographs 08/31/2015. FINDINGS: The bones are demineralized. There are stable postsurgical changes status post midfoot and hindfoot arthrodesis. Soft tissue swelling in the dorsum of the foot is unchanged. There is no evidence of acute fracture, dislocation or bone destruction in the foot. Oblique fracture of the distal tibial diaphysis is noted, further described on lower leg radiographs. IMPRESSION: No acute findings in the foot. See separate examination of the lower leg. Electronically Signed   By: Carey Bullocks M.D.   On: 10/19/2015 12:11   Dg Femur Min 2 Views Left  10/19/2015  CLINICAL DATA:  Leg swelling, ulcer LEFT ankle, questionable injury; cerebral palsy, patient unable to provide additional history EXAM: LEFT FEMUR 2 VIEWS COMPARISON:  None FINDINGS: Hip joint preserved. Diffuse osseous demineralization. Probable narrowing at LEFT knee joint. No acute fracture, dislocation or bone destruction. Visualized pelvis grossly intact. IMPRESSION: Probable mild degenerative changes of the LEFT hip and LEFT knee joints. No acute abnormalities. Electronically Signed   By: Ulyses Southward M.D.   On: 10/19/2015 12:07     Beaulah Dinning, MD 10/19/2015, 2:47 PM PGY-1, [facility] FPTS Intern pager: [phone], text pages welcome   I agree with the above evaluation, assessment, and plan. Any correctional changes can be noted in [facility].   Yolande Jolly, MD Family Medicine Resident - PGY 2

## 2015-10-19 NOTE — ED Notes (Signed)
Pt repositioned with towels in between his legs for support/comfort and 2 pillows placed under his head for the same. Pt has CP and is contractured in limbs.

## 2015-10-19 NOTE — ED Notes (Signed)
Attempted report. Left callback

## 2015-10-19 NOTE — Progress Notes (Signed)
VASCULAR LAB PRELIMINARY  PRELIMINARY  PRELIMINARY  PRELIMINARY  Left lower extremity venous duplex completed.    Preliminary report:  There is no obvious evidence of DVT or SVT noted in the visualized veins of the left lower extremity.   Lacara Dunsworth, RVT 10/19/2015, 3:40 PM
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
## 2015-10-19 NOTE — Assessment & Plan Note (Addendum)
Obviously infected with surrounding cellulitis. Did not attempt to probe to bone, but concern continues for possible underlying osteomyelitis. Fortunately vitals are stable and patient is without systemic signs/sx.  I am also worried about possible fracture of left leg/knee given the trauma he recently experienced and his continued pain up higher in his leg. Significant swelling and warmth in calf raises concern for possible DVT as well. With all this in mind, the most prudent and expedited workup will be for him to go to the ER for evaluation. Discussed with patient and his caregiver, who are both in agreement. Called ED charge nurse and informed them of patient's pending arrival.

## 2015-10-20 DIAGNOSIS — L0390 Cellulitis, unspecified: Secondary | ICD-10-CM | POA: Diagnosis not present

## 2015-10-20 DIAGNOSIS — L03116 Cellulitis of left lower limb: Secondary | ICD-10-CM | POA: Diagnosis not present

## 2015-10-20 DIAGNOSIS — S82202A Unspecified fracture of shaft of left tibia, initial encounter for closed fracture: Secondary | ICD-10-CM | POA: Diagnosis not present

## 2015-10-20 DIAGNOSIS — S82302A Unspecified fracture of lower end of left tibia, initial encounter for closed fracture: Secondary | ICD-10-CM | POA: Diagnosis not present

## 2015-10-20 DIAGNOSIS — L97309 Non-pressure chronic ulcer of unspecified ankle with unspecified severity: Secondary | ICD-10-CM

## 2015-10-20 DIAGNOSIS — L0291 Cutaneous abscess, unspecified: Secondary | ICD-10-CM

## 2015-10-20 DIAGNOSIS — I83023 Varicose veins of left lower extremity with ulcer of ankle: Secondary | ICD-10-CM | POA: Diagnosis not present

## 2015-10-20 DIAGNOSIS — S82309A Unspecified fracture of lower end of unspecified tibia, initial encounter for closed fracture: Secondary | ICD-10-CM | POA: Insufficient documentation

## 2015-10-20 DIAGNOSIS — I83003 Varicose veins of unspecified lower extremity with ulcer of ankle: Secondary | ICD-10-CM | POA: Diagnosis present

## 2015-10-20 LAB — CBC
HCT: 35.8 % — ABNORMAL LOW (ref 39.0–52.0)
Hemoglobin: 10.8 g/dL — ABNORMAL LOW (ref 13.0–17.0)
MCH: 25.2 pg — AB (ref 26.0–34.0)
MCHC: 30.2 g/dL (ref 30.0–36.0)
MCV: 83.6 fL (ref 78.0–100.0)
PLATELETS: 302 10*3/uL (ref 150–400)
RBC: 4.28 MIL/uL (ref 4.22–5.81)
RDW: 16.8 % — ABNORMAL HIGH (ref 11.5–15.5)
WBC: 7.6 10*3/uL (ref 4.0–10.5)

## 2015-10-20 LAB — BASIC METABOLIC PANEL
ANION GAP: 7 (ref 5–15)
BUN: 14 mg/dL (ref 6–20)
CO2: 27 mmol/L (ref 22–32)
Calcium: 8.9 mg/dL (ref 8.9–10.3)
Chloride: 109 mmol/L (ref 101–111)
Creatinine, Ser: 0.51 mg/dL — ABNORMAL LOW (ref 0.61–1.24)
GFR calc Af Amer: >60 mL/min (ref >60)
GFR calc non Af Amer: >60 mL/min (ref >60)
Glucose, Bld: 89 mg/dL (ref 65–99)
POTASSIUM: 5 mmol/L (ref 3.5–5.1)
SODIUM: 143 mmol/L (ref 135–145)

## 2015-10-20 MED ORDER — COLLAGENASE 250 UNIT/GM EX OINT
TOPICAL_OINTMENT | Freq: Every day | CUTANEOUS | Status: DC
  Filled 2015-10-20: qty 30

## 2015-10-20 NOTE — Progress Notes (Signed)
Orthopedic Tech Progress Note Patient Details:  [name] 08/06/1960 [id number] Removed fiberglass posterior short leg splint and fiberglass stirrup splint from RLE. Ortho Devices Type of Ortho Device: Stirrup splint, Post (short leg) splint Ortho Device/Splint Location: RLE Ortho Device/Splint Interventions: Removal   Lesle ChrisGilliland, Shamirah Ivan L 10/20/2015, 5:47 PM

## 2015-10-20 NOTE — Discharge Summary (Signed)
Family Medicine Teaching [facility] Discharge Summary  Patient name: [name] Medical record number: [id number] Date of birth: Mar 09, 1961 Age: 55 y.o. Gender: male Date of Admission: 10/19/2015  Date of Discharge: 10/22/2015 Admitting Physician: Leighton Roach McDiarmid, MD  Primary Care Provider: Tarri Abernethy, MD Consultants: Orthopedics   Indication for Hospitalization: Cellulitis and Tibial Fracture   Discharge Diagnoses/Problem List:  Patient Active Problem List   Diagnosis Date Noted  . Cerebral palsy, quadriplegic (HCC)   . Cellulitis and abscess   . Fracture of distal end of tibia   . Cellulitis of leg, left   . Venous stasis ulcer of ankle (HCC)   . Tibial fracture 10/19/2015  . 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 Pre-diabetes 08/31/2015  . Cellulitis 06/08/2015  . Foot ulcer, limited to breakdown of skin (HCC) 06/08/2015  . Osteomyelitis of toe of right foot (HCC) 12/14/2014  . Constipation 11/01/2014  . Seasonal allergies 11/01/2014  . Chronic osteomyelitis (HCC)   . Toe pain, right   . Abdominal distension   . Abdominal pain   . Colitis 10/15/2014  . Pancolitis (HCC) 10/15/2014  . Cough 10/14/2014  . Pressure ulcer of foot 03/14/2014  . Knee pain 11/23/2013  . Healthcare maintenance 06/24/2013  . 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 Onychomycosis 01/13/2013  . Neuropathy (HCC) 01/13/2013  . Pressure ulcer 01/08/2013  . 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 Medication monitoring encounter 07/10/2011  . Hyperlipidemia 07/02/2010  . RENAL GLYCOSURIA 02/26/2010  . URINARY INCONTINENCE 02/09/2010  . 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 ECZEMA 06/09/2008  . 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 Mental retardation 07/24/2006  . Infantile cerebral palsy (HCC) 07/24/2006  . GASTRIC ULCER ACUTE WITH HEMORRHAGE 07/24/2006    Disposition: ALF   Discharge Condition: stable   Discharge Exam:  General: Patient lying on his  side, NAD  Cardiovascular: RRR, no murmurs or rubs  Respiratory: CTAB, no wheezes or rhonchi  Abdomen: BS+, no ttp, no distention Extremities: 2x2 cm ulcer on medial malleolus with surrounding erythema, leg swollen with 2+ pitting edema,  Brief Hospital Course:  [name] is 55 y.o. presenting with left lower leg pain. On admission patient was noted to have a worsening of his left foot cellulitis likely from left medial malleolus pressure ulcer. Patient had previously been followed by wound care at his ALF and on  09/01/2015 was started on bactrim-DS 1 tab BID x 7 days and Augmentin 1 tab BID x 7 days. During this admission, patient did not have an elevated WBC, nor fever.  X-rays negative for osteomyelitis. Therefore patient was treated with doxycycline 100 mg BID. Patient to receive 14 days of treatment total, with stop date on July 7th.   During this admission, patient was also found to have a spiral fracture of the distal tibial diaphysis. Orthopedics was consulted and recommended splinting of fracture and outpatient follow up. Patient's pain was controlled with Tramadol. Patient was then determined to be ready for discharge.     Issues for Follow Up:  1. Referral sent to wound care clinic, ensure that patient has received communication regarding this referral  2. Continue Doxycycline 100 mg BID for a total of 14 days for cellulitis associated with pressure ulcer 3. Evaluate ulcer for worsening  4. Ensure patient follows up with Orthopedics   Significant Procedures: None  Significant Labs and Imaging:   Recent Labs Lab 10/19/15 2228 10/20/15 0628 10/21/15 0553  WBC 6.8  7.6 6.8  HGB 10.1* 10.8* 10.3*  HCT 32.4* 35.8* 33.8*  PLT 281 302 356    Recent Labs Lab 10/19/15 1102 10/19/15 2228 10/20/15 0628  NA 143  --  143  K 3.6  --  5.0  CL 108  --  109  CO2 29  --  27  GLUCOSE 90  --  89  BUN 17  --  14  CREATININE 0.71 0.56* 0.51*  CALCIUM 9.2  --  8.9  MG  --  2.0   --   PHOS  --  3.3  --   ALKPHOS 171*  --   --   AST 14*  --   --   ALT 19  --   --   ALBUMIN 2.8*  --   --    No results found. Results/Tests Pending at Time of Discharge: None  Discharge Medications:    Medication List    STOP taking these medications        amoxicillin-clavulanate 875-125 MG tablet  Commonly known as:  AUGMENTIN     sulfamethoxazole-trimethoprim 800-160 MG tablet  Commonly known as:  BACTRIM DS,SEPTRA DS      TAKE these medications        baclofen 20 MG tablet  Commonly known as:  LIORESAL  Take 1 tablet (20 mg total) by mouth 3 (three) times daily.     cetirizine 10 MG tablet  Commonly known as:  ZYRTEC  Take 1 tablet (10 mg total) by mouth daily.     divalproex 125 MG capsule  Commonly known as:  DEPAKOTE SPRINKLE  Take 500 mg by mouth 2 (two) times daily.     doxycycline 100 MG tablet  Commonly known as:  VIBRA-TABS  Take 1 tablet (100 mg total) by mouth every 12 (twelve) hours.     Hydrocortisone Acetate 1 % Crea  Apply 1 application topically daily. To face, every PM.     JOBST ANTI-EM KNEE HIGH MED Misc  by Does not apply route. Apply once in the morning and remove at bedtime daily     ketoconazole 2 % shampoo  Commonly known as:  NIZORAL  USE AS DIRECTED TWICE WEEKLY     LORazepam 1 MG tablet  Commonly known as:  ATIVAN  Take 1 mg by mouth every 4 (four) hours as needed for anxiety (anxiety).     polyethylene glycol powder powder  Commonly known as:  GLYCOLAX/MIRALAX  Take 17 g by mouth 2 (two) times daily as needed.     PREVACID SOLUTAB 30 MG disintegrating tablet  Generic drug:  lansoprazole  DISSOLVE 1 TABLET ON TONGUE EVERY DAY     PREVALON HEEL PROTECTOR Misc  1 Device by Does not apply route daily.     risperiDONE 2 MG tablet  Commonly known as:  RISPERDAL  Take 2 mg by mouth at bedtime.     silver sulfADIAZINE 1 % cream  Commonly known as:  SILVADENE  Apply 1 application topically daily.     tiZANidine 4 MG  tablet  Commonly known as:  ZANAFLEX  Take 2 mg by mouth 3 (three) times daily.     tolterodine 2 MG 24 hr capsule  Commonly known as:  DETROL LA  Take 1 capsule (2 mg total) by mouth daily.     traMADol 50 MG tablet  Commonly known as:  ULTRAM  Take 1 tablet (50 mg total) by mouth every 6 (six) hours as needed for severe pain.  tretinoin 0.025 % gel  Commonly known as:  RETIN-A  Apply 1 applicator topically at bedtime.     VOLTAREN 1 % Gel  Generic drug:  diclofenac sodium  APPLY 2GRAMS TOPICALLY TO BACK FOUR TIMES DAILY FOR BACK PAIN        Discharge Instructions: Please refer to Patient Instructions section of EMR for full details.  Patient was counseled important signs and symptoms that should prompt return to medical care, changes in medications, dietary instructions, activity restrictions, and follow up appointments.   Follow-Up Appointments: Follow-up Information    Follow up with DUDA,MARCUS V, MD In 1 week.   Specialty:  Orthopedic Surgery   Contact information:   [address] [place] [address] [place] [address] [phone]       Follow up with Tarri Abernethy, MD. Go on 10/26/2015.   Specialty:  Family Medicine   Why:  Hospital Follow Up - 1:30 pm   Contact information:   [address] [place] [address] [phone]       Follow up with [facility].   Specialty:  Home Health Services   Why:  Home Health RN   Contact information:   [address] [place] [address] [phone]       Berton Bon, MD 10/23/2015, 10:41 PM PGY-1, [facility]

## 2015-10-20 NOTE — Progress Notes (Signed)
Orthopedic Tech Progress Note Patient Details:  Carola Rhineerry L Peters 05-Sep-1960 [id number] Applied posterior fiberglass short leg splint and fiberglass stirrup splint to LLE.  Pulses, sensation, motion intact before and after splinting.  Capillary refill less than 2 seconds before and after splinting. Ortho Devices Type of Ortho Device: Stirrup splint, Post (short leg) splint Ortho Device/Splint Location: LLE Ortho Device/Splint Interventions: Application   Lesle ChrisGilliland, Aiyana Stegmann L 10/20/2015, 5:49 PM

## 2015-10-20 NOTE — Consult Note (Signed)
WOC wound consult note Reason for Consult: Consult requested for left inner ankle. Wound type: Chronic full thickness wound which has been followed by an outpatient wound care center in the past. Measurement: 3.5X3cm Wound bed: 10% red, 90% yellow wound bed Drainage (amount, consistency, odor) Small amt yellow drainage Periwound: generalized edema and erythremia to left leg and foot, pt is slightly contracted and it is difficult to reposition left foot off the affected area. Dressing procedure/placement/frequency: Santyl ointment to provide enzymatic debridement.  Float heel to reduce pressure.  Please re-consult if further assistance is needed.  Thank-you,  Cammie Mcgeeawn Kalis Friese MSN, RN, CWOCN, [place], CNS [phone]

## 2015-10-20 NOTE — Progress Notes (Signed)
Family Medicine Teaching Service Daily Progress Note Intern Pager: [phone]  Patient name: [name] Medical record number: [id number] Date of birth: 1960/08/11 Age: 55 y.o. Gender: male  Primary Care Provider: Tarri Abernethy, MD Consultants: None  Code Status: Full  Pt Overview and Major Events to Date:   Assessment and Plan: [name] is a 55 y.o. male presenting with increasing left leg pain . PMH is significant for cerebral palsy with mental handicap, HLD, urinary incontinence, chronic left leg ulcer  Cellulitis and small ulcer of left lower extremity:  Severe pain on palpation, erythema, with surrounding edema. Left medial ankle ulcer 2.5 x 2.5 cm in size. 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 Unlikely DVT as venous duplex results negative. X-rays negative for osteomyelitis. VSS, afebrile. WBC: 11.5. LA 1.75>1.14 - WBC remains wnl, no fever, therefore will continue oral antibiotics  - Continue Doxycycline 100 mg q12  - Pain control: Tramadol 50 mg q6 PRN - Wound care center consults for ulcer management   Left tibial spiral fracture - closed fracture, splint on right leg.  - Nonweightbearing - Consulted ortho today  - monitor for compartment syndrome if increasing pain with swelling and splint.   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 No pain unless patient moves his leg.    Objective: Temp:  [96.3 F (35.7 C)-98.9 F (37.2 C)] 98.9 F (37.2 C) (05/26 0621) Pulse Rate:  [66-105] 78 (05/26 0621) Resp:  [15-18] 15 (05/26 0621) BP: (100-135)/(49-114) 123/78 mmHg (05/26 0621) SpO2:  [92 %-100 %] 99 % (05/26 0621) Weight:  [175 lb (79.379 kg)] 175 lb (79.379 kg) (05/25 1020) Physical Exam: General: Patient lying on his side, NAD  Cardiovascular: RRR, no murmurs or rubs  Respiratory: CTAB, no wheezes or rhonchi  Abdomen: BS+, no ttp, no distention Extremities: 2x2 cm ulcer on medial malleolus with surrounding erythema, leg swollen with 2+ pitting edema,  Laboratory:  Recent Labs Lab 10/19/15 1102 10/19/15 2228  WBC 6.7 6.8  HGB 11.5* 10.1*  HCT 37.4* 32.4*  PLT 295 281    Recent Labs Lab 10/19/15 1102 10/19/15 2228  NA 143  --   K 3.6  --   CL 108  --   CO2 29  --   BUN 17  --   CREATININE 0.71 0.56*  CALCIUM 9.2  --   PROT 6.3*  --   BILITOT 0.6  --   ALKPHOS 171*  --   ALT 19  --   AST 14*  --   GLUCOSE 90  --      Imaging/Diagnostic Tests: Dg Tibia/fibula Left  10/19/2015  CLINICAL DATA:  Lower leg swelling with ulceration over the medial malleolus. EXAM: LEFT TIBIA AND FIBULA - 2 VIEW COMPARISON:  Radiographs 08/31/2015 FINDINGS: The bones are demineralized. There is a spiral fracture of the distal tibial diaphysis with 6 mm of posterior displacement. The fibula appears intact. There is no dislocation or bone destruction. Postsurgical changes are present  in the hindfoot status post subtalar arthrodesis. There is generalized soft tissue swelling/ edema without evidence of foreign body or definite soft tissue emphysema. IMPRESSION: 1. Spiral fracture of the distal tibial diaphysis. 2. No radiographic evidence of osteomyelitis. Electronically Signed   By: Carey Bullocks M.D.   On: 10/19/2015 12:09   Dg Foot Complete Left  10/19/2015  CLINICAL DATA:  Lower leg swelling with ulceration over the medial malleolus. EXAM: LEFT FOOT -  COMPLETE 3+ VIEW COMPARISON:  Foot radiographs 08/31/2015. FINDINGS: The bones are demineralized. There are stable postsurgical changes status post midfoot and hindfoot arthrodesis. Soft tissue swelling in the dorsum of the foot is unchanged. There is no evidence of acute fracture, dislocation or bone destruction in the foot. Oblique fracture of the distal tibial diaphysis is noted, further described on lower leg radiographs. IMPRESSION: No acute findings in the foot. See separate examination of the lower leg. Electronically Signed   By: Carey Bullocks M.D.   On: 10/19/2015 12:11   Dg Femur Min 2 Views Left  10/19/2015  CLINICAL DATA:  Leg swelling, ulcer LEFT ankle, questionable injury; cerebral palsy, patient unable to provide additional history EXAM: LEFT FEMUR 2 VIEWS COMPARISON:  None FINDINGS: Hip joint preserved. Diffuse osseous demineralization. Probable narrowing at LEFT knee joint. No acute fracture, dislocation or bone destruction. Visualized pelvis grossly intact. IMPRESSION: Probable mild degenerative changes of the LEFT hip and LEFT knee joints. No acute abnormalities. Electronically Signed   By: Ulyses Southward M.D.   On: 10/19/2015 12:07    Deveney Bayon Mayra Reel, MD 10/20/2015, 7:28 AM PGY-1, [facility] Family Medicine FPTS Intern pager: [phone], text pages welcome

## 2015-10-21 DIAGNOSIS — I83023 Varicose veins of left lower extremity with ulcer of ankle: Secondary | ICD-10-CM

## 2015-10-21 DIAGNOSIS — G808 Other cerebral palsy: Secondary | ICD-10-CM | POA: Diagnosis present

## 2015-10-21 DIAGNOSIS — L03116 Cellulitis of left lower limb: Secondary | ICD-10-CM

## 2015-10-21 DIAGNOSIS — S82302D Unspecified fracture of lower end of left tibia, subsequent encounter for closed fracture with routine healing: Secondary | ICD-10-CM

## 2015-10-21 DIAGNOSIS — L0390 Cellulitis, unspecified: Secondary | ICD-10-CM | POA: Diagnosis not present

## 2015-10-21 DIAGNOSIS — S82202A Unspecified fracture of shaft of left tibia, initial encounter for closed fracture: Secondary | ICD-10-CM | POA: Diagnosis not present

## 2015-10-21 LAB — GLUCOSE, CAPILLARY: Glucose-Capillary: 81 mg/dL (ref 65–99)

## 2015-10-21 LAB — CBC
HEMATOCRIT: 33.8 % — AB (ref 39.0–52.0)
HEMOGLOBIN: 10.3 g/dL — AB (ref 13.0–17.0)
MCH: 25.3 pg — AB (ref 26.0–34.0)
MCHC: 30.5 g/dL (ref 30.0–36.0)
MCV: 83 fL (ref 78.0–100.0)
Platelets: 356 10*3/uL (ref 150–400)
RBC: 4.07 MIL/uL — ABNORMAL LOW (ref 4.22–5.81)
RDW: 16.6 % — ABNORMAL HIGH (ref 11.5–15.5)
WBC: 6.8 10*3/uL (ref 4.0–10.5)

## 2015-10-21 MED ORDER — DOXYCYCLINE HYCLATE 100 MG PO TABS: 100.0000 mg | ORAL_TABLET | Freq: Two times a day (BID) | ORAL | Status: DC

## 2015-10-21 NOTE — Progress Notes (Signed)
Regarding orthopedic consultation. Pt. To be seen as an outpatient per previous discussion with Orthopedics on 5/26. Waiting on Hoyer lift to be available to safely transfer patient at his facility upon discharge. Like will be safe for d/c tomorrow.   Devota Pacealeb Retha Bither, MD Family Medicine - PGY 2

## 2015-10-21 NOTE — Progress Notes (Signed)
[facility] and was told that Pt does not have a hoyer lift specifically for him.  He has been using someone else's.  Merry ProudBrandi stated that they can get a hoyer lift for Pt but she is unsure how to go that route.  Brandi to contact her supervisor and ask that supervisor contact SW, as Merry ProudBrandi was not comfortable giving SW the supervisor's phone number.  Providence CrosbyAmanda Lavonne Cass, LCSW Clinical Social Work [phone]

## 2015-10-21 NOTE — Progress Notes (Signed)
Family Medicine Teaching Service Daily Progress Note Intern Pager: [phone]  Patient name: [name] Medical record number: [id number] Date of birth: 08/12/1960 Age: 55 y.o. Gender: male  Primary Care Provider: Tarri AbernethyAbigail J Lancaster, MD Consultants: None  Code Status: Full  Pt Overview and Major Events to Date:   Assessment and Plan: [name] is a 55 y.o. male presenting with increasing left leg pain . PMH is significant for cerebral palsy with mental handicap, HLD, urinary incontinence, chronic left leg ulcer  Cellulitis and small ulcer of left lower extremity:   Left medial ankle ulcer 2.5 x 2.5 cm in size.  - Continue Doxycycline 100 mg q12  - Pain control: Tramadol 50 mg q6 PRN - Wound care: Santyl ointment to provide enzymatic debridement. Float heel to reduce pressure  Left tibial spiral fracture - closed fracture, splint on right leg.  - Nonweightbearing - Ortho to follow  - PT/OT to eval  - monitor for compartment syndrome if increasing pain with swelling and splint.   Cerebral palsy: Spastic quadriplegia, limited to wheelchair or bedbound - continue home muscle relaxant regimen of baclofen+tizanidine - continue home lorazepam - continue home depakote 500mg  BID - continue home risperdal  Anxiety - continue ativan 1mg  q4hr as needed for anxiety  Social: Currently living in group home, has a caretaker at bedside  FEN/GI: SLIV, regular diet Prophylaxis: Lovenox  Disposition: pending PT and OT eval.    Subjective:  Is eating well and having no complaints.   Objective: Temp:  [98 F (36.7 C)-98.4 F (36.9 C)] 98 F (36.7 C) (05/27 0528) Pulse Rate:  [72-84] 75 (05/27 0528) Resp:  [16] 16 (05/27 0528) BP: (122-134)/(70-76) 134/71 mmHg (05/27 0528) SpO2:  [96 %-99 %] 99 % (05/27 0528) Weight:  [166 lb 2 oz (75.354 kg)] 166 lb 2 oz (75.354 kg) (05/27 0500) Physical Exam: General: Patient lying on his side, NAD  Cardiovascular: RRR, no murmurs or rubs   Respiratory: CTAB, no wheezes or rhonchi  Abdomen: BS+, no ttp, no distention Extremities: 2x2 cm ulcer on medial malleolus with surrounding erythema, leg swollen with 2+ pitting edema,  Laboratory:  Recent Labs Lab 10/19/15 2228 10/20/15 0628 10/21/15 0553  WBC 6.8 7.6 6.8  HGB 10.1* 10.8* 10.3*  HCT 32.4* 35.8* 33.8*  PLT 281 302 356    Recent Labs Lab 10/19/15 1102 10/19/15 2228 10/20/15 0628  NA 143  --  143  K 3.6  --  5.0  CL 108  --  109  CO2 29  --  27  BUN 17  --  14  CREATININE 0.71 0.56* 0.51*  CALCIUM 9.2  --  8.9  PROT 6.3*  --   --   BILITOT 0.6  --   --   ALKPHOS 171*  --   --   ALT 19  --   --   AST 14*  --   --   GLUCOSE 90  --  89     Imaging/Diagnostic Tests: No results found.  Myra RudeJeremy E Schmitz, MD 10/21/2015, 11:11 AM PGY-3, [place] Family Medicine FPTS Intern pager: [phone], text pages welcome

## 2015-10-21 NOTE — Progress Notes (Signed)
OT evaluation    10/21/15 1325  OT Visit Information  Last OT Received On 10/21/15  Assistance Needed +2  PT/OT/SLP Co-Evaluation/Treatment Yes  Reason for Co-Treatment For patient/therapist safety  OT goals addressed during session ADL's and self-care;Other (comment) (mobility)  History of Present Illness Pt presents with left spiral tibial fx, PMH significant for cerebral palsy, mental handicap, HLD, incontinence, chronic left leg ulcer and pt with acne vulgaris.  Restrictions  Weight Bearing Restrictions Yes  LLE Weight Bearing NWB  Home Living  Family/patient expects to be discharged to: Group home  Additional Comments group home does not currently have a lift for pt, he has been using someone else's and it does not keep his LE's NWB; has electric wheelchair  Prior Function  Level of Independence Needs assistance  Gait / Transfers Assistance Needed pt drives power chair, but he ran legs into wall sustaining this fx (he is positioned with knees almost fully extended)  ADL's / [place] assist with bathing and dressing. Family reports pt could feed self and perform grooming tasks and he could doff his shirt  Communication  Communication Expressive difficulties  Pain Assessment  Pain Assessment Faces  Faces Pain Scale 8  Pain Location LLE and bottom  Pain Descriptors / Indicators Sore;Grimacing  Pain Intervention(s) Monitored during session;Repositioned  Cognition  Arousal/Alertness Awake/alert  Behavior During Therapy WFL for tasks assessed/performed  Overall Cognitive Status History of cognitive impairments - at baseline  Upper Extremity Assessment  Upper Extremity Assessment (pt with spastic CP)  Lower Extremity Assessment  Lower Extremity Assessment Defer to PT evaluation  Cervical / Trunk Assessment  Cervical / Trunk Assessment Kyphotic  ADL  Overall ADL's  Needs assistance/impaired  Eating/Feeding Minimal assistance;Sitting  Eating/Feeding Details  (indicate cue type and reason) pt sat EOB and ate some of his lunch; assist to give items to pt and also to adjust wrapper on burger  Grooming Wash/dry face;Sitting;Minimal assistance  Grooming Details (indicate cue type and reason) able to wash face with Min guard assist sitting EOB.   Lower Body Dressing Total assistance;+2 for physical assistance;Bed level  Toilet Transfer Details (indicate cue type and reason) not assessed  Functional mobility during ADLs (+2 Total Assist for bed mobility)  General ADL Comments PT spoke about pt possibly using a manual wheelchair for a while to prevent him from running into things.   Bed Mobility  Overal bed mobility Needs Assistance  Bed Mobility Sit to Supine;Supine to Sit  Supine to sit +2 for physical assistance;Total assist  Sit to supine +2 for physical assistance;Total assist  General bed mobility comments pillow used under LLE.  Assist given for bilateral LEs and for trunk.  Transfers  General transfer comment not assessed  OT - End of Session  Activity Tolerance Patient tolerated treatment well  Patient left in bed;with call bell/phone within reach;with family/visitor present  OT Assessment  OT Therapy Diagnosis  Acute pain  OT Recommendation/Assessment All further OT needs can be met in the next venue of care  OT Problem List Pain;Increased edema;Decreased range of motion;Decreased activity tolerance;Impaired balance (sitting and/or standing);Decreased cognition;Decreased coordination;Decreased strength;Decreased knowledge of use of DME or AE;Impaired tone  OT Recommendation  Follow Up Recommendations (return to group home; if proper lift equipment not available then may have to go to SNF)  OT Equipment Other (comment) (hoyer lift; manual wheelchair with elevating leg rests)  Individuals Consulted  Consulted and Agree with Results and Recommendations (OT didn't discuss d/c recommendation)  Acute  Rehab OT Goals  Patient Stated Goal sister  wants pt to go back to group home  OT Time Calculation  OT Start Time (ACUTE ONLY) 1213  OT Stop Time (ACUTE ONLY) 1243  OT Time Calculation (min) 30 min  OT G-codes **NOT FOR INPATIENT CLASS**  Functional Assessment Tool Used clinical judgment  Functional Limitation Self care  Self Care Current Status (U8891) CL  Self Care Goal Status (Q9450) CL  Self Care Discharge Status (T8882) CL  OT General Charges  $OT Visit 1 Procedure  OT Evaluation  $OT Eval High Complexity 1 Procedure  Written Expression  Dominant Hand Left    Roseanne Reno, OTR/L [phone]

## 2015-10-21 NOTE — Evaluation (Addendum)
Physical Therapy Evaluation Patient Details Name: [name] MRN: [id number] DOB: [birth year] Today's Date: 10/21/2015   History of Present Illness  Pt presents with left spiral tibial fx, PMH significant for cerebral palsy, mental handicap, HLD, incontinence, chronic left leg ulcer and pt with acne vulgaris.  Clinical Impression  Pt admitted with above diagnosis. Pt currently with functional limitations due to the deficits listed below (see PT Problem List). Pt +2 tot A to sit up to EOB but once in sitting, pt able to maintain sitting with right sided propping and min A and perform trunk stability activities x 15 mins.  Pt will benefit from skilled PT to increase their independence and safety with mobility to allow discharge to the venue listed below.       Follow Up Recommendations No PT follow up    Equipment Recommendations  Other (comment);Wheelchair (measurements PT) (hoyer lift and manual w/x with elevating leg rests)  If lift not available for group home, pt will need to go to rehab while NWB   Recommendations for Other Services       Precautions / Restrictions Precautions Precautions: Fall Restrictions Weight Bearing Restrictions: Yes LLE Weight Bearing: Non weight bearing      Mobility  Bed Mobility Overal bed mobility: Needs Assistance Bed Mobility: Supine to Sit;Sit to Supine     Supine to sit: +2 for physical assistance;Total assist Sit to supine: +2 for physical assistance;Total assist   General bed mobility comments: used pillows under bilateral LE to move them and +2 tot A needed at trunk for elevation. +2 tot A for return to bed. Family reports that he can usually assist more when not in pain  Transfers                 General transfer comment: not assessed  Ambulation/Gait             General Gait Details: unable  Stairs            Wheelchair Mobility    Modified Rankin (Stroke Patients Only)       Balance Overall balance  assessment: Needs assistance Sitting-balance support: Single extremity supported;Feet supported Sitting balance-Leahy Scale: Poor Sitting balance - Comments: pt with strong right lean, propped with 3 pillows to right side but continued to need min A to readjust pillows and right trunk Postural control: Right lateral lean                     High Level Balance Comments: pt sat EOB x15 mins with bilateral LE's propped on pillows on chair, for reaching and trunk control activities             Pertinent Vitals/Pain Pain Assessment: Faces Faces Pain Scale: Hurts whole lot Pain Location: LLE and tail bone Pain Descriptors / Indicators: Grimacing;Sore Pain Intervention(s): Repositioned;Monitored during session    Home Living Family/patient expects to be discharged to:: Group home                 Additional Comments: group home does not currently have a lift for pt, he has been using someone else's and it does not keep his LE's NWB    Prior Function Level of Independence: Needs assistance   Gait / Transfers Assistance Needed: pt drives power chair, but he ran legs into wall sustaining this fx (he is positioned with knees almost fully extended)  ADL's / Homemaking Assistance Needed: assist with bathing and dressing. Family reports pt could feed  self and perform grooming tasks and he could doff his shirt        Hand Dominance   Dominant Hand: Left    Extremity/Trunk Assessment   Upper Extremity Assessment: Defer to OT evaluation           Lower Extremity Assessment: RLE deficits/detail;LLE deficits/detail RLE Deficits / Details: minimal knee flexion, atrophied legs, have been NWB for years LLE Deficits / Details: increased swelling LLE with splint below knee, pt cannot tolerate touch or much movement to LLE, pillow used to move it. Knee maintained in partial extension  Cervical / Trunk Assessment: Kyphotic;Other exceptions  Communication   Communication:  Expressive difficulties  Cognition Arousal/Alertness: Awake/alert Behavior During Therapy: WFL for tasks assessed/performed Overall Cognitive Status: History of cognitive impairments - at baseline                      General Comments General comments (skin integrity, edema, etc.): pt will not be able to go back to group home right away if hoyer lift not available. In addition, while leg is healing, he should not be driving power chair and will need a manual chair with elevating leg rest    Exercises        Assessment/Plan    PT Assessment Patient needs continued PT services  PT Diagnosis Acute pain;Quadraplegia   PT Problem List Decreased strength;Decreased range of motion;Decreased activity tolerance;Decreased balance;Decreased mobility;Decreased coordination;Decreased cognition;Impaired tone;Pain  PT Treatment Interventions DME instruction;Functional mobility training;Therapeutic activities;Therapeutic exercise;Balance training;Patient/family education   PT Goals (Current goals can be found in the Care Plan section) Acute Rehab PT Goals Patient Stated Goal: sister wants pt to go back to group home PT Goal Formulation: With patient/family Time For Goal Achievement: 11/04/15 Potential to Achieve Goals: Good Additional Goals Additional Goal #1: pt to sit EOB x5 mins with supervision Additional Goal #2: pt to tolerate hoyer lift to chair and being out of bed 1 hr in prep for return to w/c.    Frequency Min 2X/week   Barriers to discharge Inaccessible home environment no hoyer lift    Co-evaluation PT/OT/SLP Co-Evaluation/Treatment: Yes Reason for Co-Treatment: Complexity of the patient's impairments (multi-system involvement);For patient/therapist safety PT goals addressed during session: Mobility/safety with mobility;Balance;Strengthening/ROM OT goals addressed during session: ADL's and self-care;Other (comment) (mobility)       End of Session   Activity  Tolerance: Patient tolerated treatment well Patient left: in bed;with call bell/phone within reach;with family/visitor present Nurse Communication: Mobility status    Functional Assessment Tool Used: clinical judgement Functional Limitation: Mobility: Walking and moving around Mobility: Walking and Moving Around Current Status (W0981): At least 80 percent but less than 100 percent impaired, limited or restricted Mobility: Walking and Moving Around Goal Status [phone]): At least 80 percent but less than 100 percent impaired, limited or restricted    Time: 1213-1245 PT Time Calculation (min) (ACUTE ONLY): 32 min   Charges:   PT Evaluation $PT Eval Moderate Complexity: 1 Procedure     PT G Codes:   PT G-Codes **NOT FOR INPATIENT CLASS** Functional Assessment Tool Used: clinical judgement Functional Limitation: Mobility: Walking and moving around Mobility: Walking and Moving Around Current Status (W2956): At least 80 percent but less than 100 percent impaired, limited or restricted Mobility: Walking and Moving Around Goal Status [phone]): At least 80 percent but less than 100 percent impaired, limited or restricted   Lyanne Co, PT  Acute Rehab Services  [phone]  Lyanne Co 10/21/2015, 2:16 PM

## 2015-10-21 NOTE — Care Management Note (Signed)
Case Management Note  Patient Details  Name: [name] MRN: [id number] Date of Birth: [birth year]  Subjective/Objective:   Cellulitis and small ulcer of left lower extremity                 Action/Plan: Discharge Planning:  NCM spoke to pt's sister [name]. [facility]. Contacted [facility] for delivery of hoyer lift. Requested delivery of hoyer lift to Group home tonight. Spoke to [name], [facility]. States someone is at Group Home 24 hours. [facility] with resumption of care orders.   Expected Discharge Date:  10/22/2015              Expected Discharge Plan:  Group Home  In-House Referral:  Clinical Social Work  Discharge planning Services  CM Consult  Post Acute Care Choice:  [facility]  DME Arranged:  Other see comment DME Agency:  [facility]  [facility]  Status of Service:  Completed, signed off  Medicare Important Message Given:    Date Medicare IM Given:    Medicare IM give by:    Date Additional Medicare IM Given:    Additional Medicare Important Message give by:     If discussed at Long Length of Stay Meetings, dates discussed:    Additional Comments:  [name], [name] Berman [name], RN 10/21/2015, 4:12 PM

## 2015-10-22 DIAGNOSIS — S82202A Unspecified fracture of shaft of left tibia, initial encounter for closed fracture: Secondary | ICD-10-CM | POA: Diagnosis not present

## 2015-10-22 LAB — GLUCOSE, CAPILLARY: Glucose-Capillary: 101 mg/dL — ABNORMAL HIGH (ref 65–99)

## 2015-10-22 MED ORDER — TRAMADOL HCL 50 MG PO TABS: 50.0000 mg | ORAL_TABLET | Freq: Four times a day (QID) | ORAL | Status: DC | PRN

## 2015-10-22 NOTE — Progress Notes (Signed)
Family Medicine Teaching Service Daily Progress Note Intern Pager: [phone]  Patient name: [name] Medical record number: [id number] Date of birth: 1961/03/22 Age: 55 y.o. Gender: male  Primary Care Provider: Tarri AbernethyAbigail J Lancaster, MD Consultants: None  Code Status: Full  Pt Overview and Major Events to Date:   Assessment and Plan: [name] is a 55 y.o. male presenting with increasing left leg pain . PMH is significant for cerebral palsy with mental handicap, HLD, urinary incontinence, chronic left leg ulcer  Cellulitis and small ulcer of left lower extremity:   Left medial ankle ulcer 2.5 x 2.5 cm in size.  - Continue Doxycycline 100 mg q12  - Pain control: Tramadol 50 mg q6 PRN - Wound care: Santyl ointment to provide enzymatic debridement. Float heel to reduce pressure  Left tibial spiral fracture - closed fracture, splint on right leg.  - Nonweightbearing - Ortho to follow as outpatient - PT recs: No follow up - waiting on hoyer lift for group home. If not available, will need to go to rehab.  - monitor for compartment syndrome if increasing pain with swelling and splint.   Cerebral palsy: Spastic quadriplegia, limited to wheelchair or bedbound - continue home muscle relaxant regimen of baclofen+tizanidine - continue home lorazepam - continue home depakote 500mg  BID - continue home risperdal  Anxiety - continue ativan 1mg  q4hr as needed for anxiety  Social: Currently living in group home, has a caretaker at bedside  FEN/GI: SLIV, regular diet Prophylaxis: Lovenox  Disposition[place] [facility] [place] [facility] [place]. If not, will need rehab placement.   Subjective:  No complaints this morning.   Objective: Temp:  [98 F (36.7 C)-98.7 F (37.1 C)] 98.7 F (37.1 C) (05/28 0705) Pulse Rate:  [74-84] 83 (05/28 0705) Resp:  [16-18] 18 (05/28 0705) BP: (92-132)/(43-74) 128/73 mmHg (05/28 0705) SpO2:  [95 %-99 %] 95 % (05/28 0705) Physical  Exam: General: Patient lying on his side, NAD  Cardiovascular: RRR, no murmurs or rubs  Respiratory: CTAB, no wheezes or rhonchi  Abdomen: BS+, no ttp, no distention Extremities: 2x2 cm ulcer on medial malleolus with surrounding erythema, leg swollen with 2+ pitting edema,  Laboratory/Imaging: None New.   Ardith Darkaleb M Janssen Zee, MD 10/22/2015, 9:44 AM PGY-2, [place] [facility] FPTS Intern pager: [phone], text pages welcome

## 2015-10-22 NOTE — Discharge Instructions (Signed)
Tibial Fracture, Adult A tibial fracture is a break in your tibia bone. The tibia is the large shin bone in your lower leg. The bone will be held in place with a cast or splint until it is healed. HOME CARE  If you have a cast:  Do not scratch under the cast.  Check the skin around the cast every day. You may put lotion on any red or sore areas.  Keep your cast dry and clean.  If you have a splint:  Wear the splint as told by your doctor.  Loosen the elastic around the splint if your toes get numb, tingle, or turn cold or blue.  Do not put pressure on the cast or splint until it is hard.  Do not put the cast or splint in water. Cover it with a plastic bag when bathing.  Use crutches as told by your doctor.  Take medicines only as told by your doctor.  Keep all follow-up visits as told by your doctor. This is important. GET HELP IF:  Your pain gets worse or is not controlled with medicine.  You have increased puffiness (swelling) or redness in your foot.  You start to lose feeling in your foot or toes. GET HELP RIGHT AWAY IF:  Your foot or toes get cold or turn blue.  You have bad pain in your leg, especially if it gets worse when you move your toes. MAKE SURE YOU:  Understand these instructions.  Will watch your condition.  Will get help right away if you are not doing well or get worse.   This information is not intended to replace advice given to you by your health care provider. Make sure you discuss any questions you have with your health care provider.   Document Released: 06/15/2010 Document Revised: 09/27/2014 Document Reviewed: 07/07/2013 Elsevier Interactive Patient Education 2016 Elsevier Inc. Cellulitis Cellulitis is an infection of the skin and the tissue under the skin. The infected area is usually red and tender. This happens most often in the arms and lower legs. HOME CARE   Take your antibiotic medicine as told. Finish the medicine even if you  start to feel better.  Keep the infected arm or leg raised (elevated).  Put a warm cloth on the area up to 4 times per day.  Only take medicines as told by your doctor.  Keep all doctor visits as told. GET HELP IF:  You see red streaks on the skin coming from the infected area.  Your red area gets bigger or turns a dark color.  Your bone or joint under the infected area is painful after the skin heals.  Your infection comes back in the same area or different area.  You have a puffy (swollen) bump in the infected area.  You have new symptoms.  You have a fever. GET HELP RIGHT AWAY IF:   You feel very sleepy.  You throw up (vomit) or have watery poop (diarrhea).  You feel sick and have muscle aches and pains.   This information is not intended to replace advice given to you by your health care provider. Make sure you discuss any questions you have with your health care provider.   Document Released: 10/30/2007 Document Revised: 02/01/2015 Document Reviewed: 07/29/2011 Elsevier Interactive Patient Education [employer] Elsevier Inc.

## 2015-10-22 NOTE — Care Management (Signed)
Hoyer lift sling used for toileting delivered to patient's room. Placed in patient's belongings bag. Physicist, medicalCrystal Kailand Seda RN BSN CCM

## 2015-10-22 NOTE — Care Management (Signed)
[name]. [name] states the [employer] was not delivered last night. She was contacted this morning and informed the hoyer will be delivered this morning.   Received a call from [name] [facility] stating the [employer] Lift has been delivered. She is requesting a sling with an opening allowing them to use the hoyer to transfer the patient to the toilet. [name] states she spoke with [name] and was instructed he wants the patient to have a painless transfer. She states they need the sling with a hole in it to ensure a painless transfer. [name]. The sling for toileting will be shipped to the group home if it is not available to deliver to the patient's room prior to discharge. Physicist, medicalCrystal Mattilynn Forrer RN BSN CCM

## 2015-10-22 NOTE — Progress Notes (Addendum)
[facility] to [facility] vis ambulance. Leave pt's weelchair at front station for group home member to pick up.

## 2015-10-26 ENCOUNTER — Encounter: Payer: Self-pay | Admitting: Internal Medicine

## 2015-10-26 ENCOUNTER — Ambulatory Visit (INDEPENDENT_AMBULATORY_CARE_PROVIDER_SITE_OTHER): Payer: Medicare Other | Admitting: Internal Medicine

## 2015-10-26 VITALS — BP 125/74 | HR 85 | Temp 98.6°F

## 2015-10-26 DIAGNOSIS — I83023 Varicose veins of left lower extremity with ulcer of ankle: Secondary | ICD-10-CM | POA: Diagnosis not present

## 2015-10-26 DIAGNOSIS — S82202D Unspecified fracture of shaft of left tibia, subsequent encounter for closed fracture with routine healing: Secondary | ICD-10-CM

## 2015-10-26 DIAGNOSIS — L97329 Non-pressure chronic ulcer of left ankle with unspecified severity: Principal | ICD-10-CM

## 2015-10-26 NOTE — Assessment & Plan Note (Signed)
Leg splinted from hospital admission. Pain continues to improve, though with some pain during movement.  - Caretaker to call Dr. Audrie Liauda's office today to schedule appt for early next week as instructed by ortho during admission

## 2015-10-26 NOTE — Progress Notes (Signed)
   Subjective:    Patient ID: [name], male    DOB: 1960-06-23, 55 y.o.   MRN: [id number]  HPI  Patient presents for hospital f/u after admission for cellulitis and LE fracture.   L tibial spiral fractures Patient reports improvement in LLE pain. Prior to admission, he was in pain at rest. He now reports pain only with movement. Leg remains splinted. Patient's caretaker to call to schedule an appointment with Dr. Lajoyce Cornersuda today, as instructed during admission.   LE cellulitis Patient has continued to take doxycycline BID as instructed at discharge. He will complete the 14 day course next week. Reports diarrhea but no other side effects.    Patient is a never smoker.   Review of Systems See HPI.     Objective:   Physical Exam  Constitutional: He is oriented to person, place, and time.  Pleasant male, sitting in motorized wheelchair in NAD  HENT:  Head: Normocephalic and atraumatic.  Musculoskeletal:  LLE splinted  Neurological: He is alert and oriented to person, place, and time.  Psychiatric: He has a normal mood and affect. His behavior is normal.      Assessment & Plan:  Venous stasis ulcer of ankle (HCC) As ulcer is on same leg as fracture, did not take off splint to inspect wound so not to further disturb the fracture. Patient has been compliant with antibiotics.  - Continue doxycycline BID to complete 14d course (last dose 06/07) - [facility]  Tibial fracture Leg splinted from hospital admission. Pain continues to improve, though with some pain during movement.  - Caretaker to call Dr. Audrie Liauda's office today to schedule appt for early next week as instructed by ortho during admission   Tarri AbernethyAbigail J Lancaster, MD PGY-1 [facility] Pager [phone]

## 2015-10-26 NOTE — Patient Instructions (Signed)
It was nice seeing you today [name]!  Please continue to take your antibiotics as you have been. It is also important to go to your appointment with Dr. Lajoyce Cornersuda [facility].   If your leg pain worsens or you start to have pain at rest, please call the office to let us know.   Be well,  Dr. Natale MilchLancaster

## 2015-10-26 NOTE — Assessment & Plan Note (Signed)
As ulcer is on same leg as fracture, did not take off splint to inspect wound so not to further disturb the fracture. Patient has been compliant with antibiotics.  - Continue doxycycline BID to complete 14d course (last dose 06/07) - [facility] with [facility] on [facility]/16

## 2015-11-02 ENCOUNTER — Telehealth: Payer: Self-pay | Admitting: *Deleted

## 2015-11-02 NOTE — Telephone Encounter (Signed)
Heather the nurse at [facility] wants to know the next step for this pt. Should she keep monitoring the healing process for a couple of weeks (in terms of frequency) or discharge him. Oddie Bottger Bruna PotterBlount, CMA

## 2015-11-03 NOTE — Telephone Encounter (Signed)
Returned call from [name] with [name]. She reports that his leg wound is much improved after seeing Dr. Lajoyce Cornersuda twice, and is now roughly half the size it was previously with minimal drainage. He is now having dressing changes performed daily at his ALF, and [name] is concerned that Medicare will not continue to cover many more visits from her if he continues to improve. Current plan is for [name] to see [name] next Wednesday (06/14) after he sees Dr. Lajoyce Cornersuda again, then once more the following week with likely discharge from her services at that time. [name] will call back after seeing patient next week if there has been a change in his wound status.   [name] AbernethyAbigail J Zamariya Neal, MD PGY-1 [facility] Pager [phone]

## 2015-11-09 ENCOUNTER — Encounter: Payer: Self-pay | Admitting: Family Medicine

## 2015-11-09 ENCOUNTER — Ambulatory Visit (INDEPENDENT_AMBULATORY_CARE_PROVIDER_SITE_OTHER): Payer: Medicare Other | Admitting: Family Medicine

## 2015-11-09 VITALS — BP 129/71 | HR 78 | Temp 98.5°F

## 2015-11-09 DIAGNOSIS — L03211 Cellulitis of face: Secondary | ICD-10-CM

## 2015-11-09 DIAGNOSIS — S82202A Unspecified fracture of shaft of left tibia, initial encounter for closed fracture: Secondary | ICD-10-CM | POA: Diagnosis not present

## 2015-11-09 MED ORDER — CEPHALEXIN 500 MG PO CAPS: 500.0000 mg | ORAL_CAPSULE | Freq: Four times a day (QID) | ORAL | Status: DC

## 2015-11-09 MED ORDER — CEFTRIAXONE SODIUM 1 G IJ SOLR
1.0000 g | Freq: Once | INTRAMUSCULAR | Status: AC
  Administered 2015-11-09: 1 g via INTRAMUSCULAR

## 2015-11-09 MED ORDER — CEFTRIAXONE SODIUM 1 G IJ SOLR: 1.0000 g | INTRAMUSCULAR | Status: DC

## 2015-11-09 NOTE — Patient Instructions (Signed)
You have a skin infection. We will give you a dose of antibiotics here.  Please take the antibiotic 4 times a day for the next week.  It is very important that he comes back tomorrow to make sure it isn't getting worse.  Take care,  Dr Jimmey RalphParker

## 2015-11-09 NOTE — Progress Notes (Signed)
Subjective:     Patient ID: [name], male   DOB: [birth year], 55 y.o.   MRN: [id number]  HPI  [name] is a 55 y.o. Male with history of cerebral palsy who presents with 1 day history of swelling on right arm and right-side of face. [name] lives at a group home and is accompanied by a caregiver.   Acute Concerns:   Right Facial Swelling: Nurse noticed swelling this morning on right side of face and right arm with associated itchiness and pain. Still itchy and painful with 8/10 pain in areas that are swollen. Face also red with some yellow crust. Normally has dry flaky skin on face. Has significant periorbital swelling. Eye was closed shut this morning but has since improved a little. Has rhinorrhea and excessive drooling from right side of face.Thinks he was bit by a mosquito-like bug yesterday. Typically sleeps on right side of his body. Has not tried treating condition with medication. No new blankets, detergents, or dietary changes. NKDA. Not currently on ACE inhibitor. Denies loss of vision, eye discharge, fever, nausea, vomiting, constipation, or diarrhea.   Right Arm Swelling: Arm was swollen this morning. Has a red path on his right arm but nurse says that is due to an allergic reaction to medical tape during a hospital admission two weeks ago for a broken foot. Nurse says swelling has improved a little since this morning.   Review of Systems Normal other than stated above.    Objective:   Physical Exam Filed Vitals:   11/09/15 1409  BP: 129/71  Pulse: 78  Temp: 98.5 F (36.9 C)   Blood pressure 129/71, pulse 78, temperature 98.5 F (36.9 C), temperature source Oral, SpO2 99 %.  Gen: Appears non-distressed. Confined to wheel chair with apparent cognitive dysfunction. Is able to communicate. HEENT: Erythematous periorbital swelling of superior right eyelid. Mild rhinorrhea present. No conjunctivitis or purulent eye discharge. Normocephalic.  Skin: Erythematous, scaly  patch on right face with scattered honey crusts and edema. Seborrheic dermatitis distributed across face. Erythematous patch on proximal aspect of lateral right arm.     Assessment and Plan:     [name] is a [age] y.o. Male presents with 1 day history of swelling on right-side of face concerning for Erysipelas given honey crusting, erythema on exam, and swelling.   Erysipelas:  -Recommend Ceftriaxone injection and Keflex 500 MG 4 times daily -Return precautions discussed with caregiver -Return tomorrow for re-evaluation of skin infection  Right arm swelling:  -Seems to be improving on its own -Continue to observe -Return precautions discussed with caregiver

## 2015-11-10 ENCOUNTER — Ambulatory Visit (INDEPENDENT_AMBULATORY_CARE_PROVIDER_SITE_OTHER): Payer: Medicare Other | Admitting: Family Medicine

## 2015-11-10 ENCOUNTER — Encounter (HOSPITAL_BASED_OUTPATIENT_CLINIC_OR_DEPARTMENT_OTHER): Payer: Medicare Other | Attending: Internal Medicine

## 2015-11-10 VITALS — BP 112/80 | HR 82 | Temp 98.2°F

## 2015-11-10 DIAGNOSIS — L03213 Periorbital cellulitis: Secondary | ICD-10-CM | POA: Diagnosis not present

## 2015-11-10 DIAGNOSIS — L03116 Cellulitis of left lower limb: Secondary | ICD-10-CM | POA: Insufficient documentation

## 2015-11-10 DIAGNOSIS — L97321 Non-pressure chronic ulcer of left ankle limited to breakdown of skin: Secondary | ICD-10-CM | POA: Insufficient documentation

## 2015-11-10 DIAGNOSIS — G809 Cerebral palsy, unspecified: Secondary | ICD-10-CM | POA: Insufficient documentation

## 2015-11-10 DIAGNOSIS — G800 Spastic quadriplegic cerebral palsy: Secondary | ICD-10-CM | POA: Insufficient documentation

## 2015-11-10 NOTE — Patient Instructions (Signed)
Orbital Cellulitis Orbital cellulitis is an infection in the eye socket (orbit) and the tissues that surround the eye. The infection can spread to the eyelids, eyebrow area, and cheek. It can also cause a pocket of pus to develop around the eye (orbital abscess). In severe cases, the infection can spread to the brain. Orbital cellulitis is a medical emergency. CAUSES The most common cause of this condition is a bacterial infection. The infection usually spreads to the eye socket from another part of the body. The infection may start in:  The nose or sinuses.  The eyelids.  Facial skin.  The bloodstream. RISK FACTORS This condition is more likely to develop in people who have recently had one of the following:  Upper respiratory infection.  Sinus infection.  Eyelid or facial infection.  Eye injury.  Infection that affects the entire body or the bloodstream (systemic infection). SYMPTOMS Symptoms of this condition usually start quickly. Symptoms include:  Eye pain that gets worse with eye movement.  Swelling around the eye.  Eye redness.  Bulging of the eye.  Inability to move the eye.  Double vision.  Fever. DIAGNOSIS This condition may be diagnosed based on your symptoms and an eye exam. You may also have tests to confirm the diagnosis and to check for an orbital abscess. Other tests (cultures) may be done to find out what type of bacteria is causing the infection. Tests may include:  Complete blood count (CBC).  Blood culture.  Nose, sinus, or throat culture.  Imaging studies such as a CT scan or MRI. TREATMENT This condition is usually treated in a hospital. Antibiotic medicines are given directly into a vein through an IV tube.  At first, you may get IV antibiotics to kill bacteria that often cause orbital cellulitis (broad spectrum antibiotics).  Your medicine may be changed if cultures suggest that another antibiotic would be better.  If the IV  antibiotics are working to treat your infection, you may be switched to oral antibiotics and allowed to go home.  In some cases, surgery may be needed to drain an orbital abscess. HOME CARE INSTRUCTIONS  Take medicines only as directed by your health care provider.  Take your antibiotic medicine as directed by your health care provider. Finish the antibiotic even if you start to feel better.  Return to your normal activities as directed by your health care provider. Ask your health care provider what activities are safe for you.  Keep all follow-up visits as directed by your health care provider. This is important. SEEK IMMEDIATE MEDICAL CARE IF:  Your eye pain or swelling returns or it gets worse.  You have any changes in your vision.  You have a fever.   This information is not intended to replace advice given to you by your health care provider. Make sure you discuss any questions you have with your health care provider.   Document Released: 05/07/2001 Document Revised: 09/27/2014 Document Reviewed: 05/09/2014 Elsevier Interactive Patient Education 2016 Elsevier Inc.  

## 2015-11-10 NOTE — Progress Notes (Signed)
RASH - cellulitis Patient is here for follow-up for cellulitis of his face, right side. Patient was seen yesterday for this issue. He first noticed it about 2 days ago. Dr. Jimmey RalphParker saw him in our office and provided one dose of IM ceftriaxone and a prescription for 7 days of Keflex. Patient and caretaker both agree that symptoms are improved from yesterday and his swelling has gone down. Patient denies any adverse side effects from the antibiotics. He denies any fevers, chills, body aches, nausea, vomiting, diarrhea, or other rashes since receiving treatment yesterday.  Had rash for 2 days. Location: Right face and periorbital region Medications tried: Ceftriaxone and Keflex Similar rash in past: No Tick, Insect or new pet exposure: No Recent travel: No New detergent or soap: No Immunocompromised: No  Symptoms Itching: Minimal Pain over rash: Some but improving Feeling ill all over: No Fever: No Mouth sores: No Face or tongue swelling: no Trouble breathing: No Joint swelling or pain: No   Objective: BP 112/80 mmHg  Pulse 82  Temp(Src) 98.2 F (36.8 C) (Oral)  Wt   SpO2 100% Gen: NAD, alert, cooperative, some cognitive dysfunction, wheelchair-bound HEENT: NCAT, EOMI, PERRL, right-sided erythema involving the upper and lower eyelids, extending across the temporal region to the preauricular tissue and down to the jawline. Upper eyelid edema noted. No evidence of lesions or drainage noted. No involvement of the conjunctiva or sclera. [See photo below]     Assessment and plan:  Periorbital cellulitis of right eye Improved: Patient was seen yesterday by Dr. Jimmey RalphParker for facial cellulitis. He was provided one dose of IM ceftriaxone and was provided a prescription for 7 days of Keflex. Today with comparison of photographs taken yesterday and today patient's cellulitis is significantly improved. I've advised patient to continue Keflex regimen and follow-up with us in 1 week if symptoms  have not dramatically improved or have worsened.   Kathee DeltonIan D Neveah Bang, MD,MS,  PGY2 11/10/2015 9:47 AM

## 2015-11-10 NOTE — Assessment & Plan Note (Signed)
Improved: Patient was seen yesterday by Dr. Jimmey RalphParker for facial cellulitis. He was provided one dose of IM ceftriaxone and was provided a prescription for 7 days of Keflex. Today with comparison of photographs taken yesterday and today patient's cellulitis is significantly improved. I've advised patient to continue Keflex regimen and follow-up with us in 1 week if symptoms have not dramatically improved or have worsened.

## 2015-11-16 ENCOUNTER — Ambulatory Visit (HOSPITAL_BASED_OUTPATIENT_CLINIC_OR_DEPARTMENT_OTHER): Payer: Medicare Other | Admitting: Physical Medicine & Rehabilitation

## 2015-11-16 ENCOUNTER — Encounter: Payer: Medicare Other | Attending: Physical Medicine & Rehabilitation

## 2015-11-16 ENCOUNTER — Encounter: Payer: Self-pay | Admitting: Physical Medicine & Rehabilitation

## 2015-11-16 VITALS — BP 105/72 | HR 92 | Resp 17

## 2015-11-16 DIAGNOSIS — G808 Other cerebral palsy: Secondary | ICD-10-CM

## 2015-11-16 DIAGNOSIS — F319 Bipolar disorder, unspecified: Secondary | ICD-10-CM | POA: Insufficient documentation

## 2015-11-16 DIAGNOSIS — E7800 Pure hypercholesterolemia, unspecified: Secondary | ICD-10-CM | POA: Insufficient documentation

## 2015-11-16 DIAGNOSIS — G8250 Quadriplegia, unspecified: Secondary | ICD-10-CM | POA: Insufficient documentation

## 2015-11-16 DIAGNOSIS — R252 Cramp and spasm: Secondary | ICD-10-CM | POA: Diagnosis not present

## 2015-11-16 DIAGNOSIS — Z87898 Personal history of other specified conditions: Secondary | ICD-10-CM | POA: Diagnosis not present

## 2015-11-16 DIAGNOSIS — F419 Anxiety disorder, unspecified: Secondary | ICD-10-CM | POA: Diagnosis not present

## 2015-11-16 DIAGNOSIS — R32 Unspecified urinary incontinence: Secondary | ICD-10-CM | POA: Diagnosis not present

## 2015-11-16 DIAGNOSIS — K219 Gastro-esophageal reflux disease without esophagitis: Secondary | ICD-10-CM | POA: Insufficient documentation

## 2015-11-16 NOTE — Patient Instructions (Signed)
We plan to inject the right biceps and brachioradialis to reduce elbow flexor tone in the right arm.
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
## 2015-11-23 DIAGNOSIS — L97321 Non-pressure chronic ulcer of left ankle limited to breakdown of skin: Secondary | ICD-10-CM | POA: Diagnosis present

## 2015-11-23 DIAGNOSIS — L03116 Cellulitis of left lower limb: Secondary | ICD-10-CM | POA: Diagnosis not present

## 2015-11-23 DIAGNOSIS — G809 Cerebral palsy, unspecified: Secondary | ICD-10-CM | POA: Diagnosis not present

## 2015-11-23 DIAGNOSIS — G800 Spastic quadriplegic cerebral palsy: Secondary | ICD-10-CM | POA: Diagnosis not present

## 2015-11-30 ENCOUNTER — Encounter (HOSPITAL_BASED_OUTPATIENT_CLINIC_OR_DEPARTMENT_OTHER): Payer: Medicare Other | Attending: Internal Medicine

## 2015-11-30 DIAGNOSIS — L97321 Non-pressure chronic ulcer of left ankle limited to breakdown of skin: Secondary | ICD-10-CM | POA: Diagnosis present

## 2015-11-30 DIAGNOSIS — G800 Spastic quadriplegic cerebral palsy: Secondary | ICD-10-CM | POA: Insufficient documentation

## 2015-11-30 DIAGNOSIS — I87332 Chronic venous hypertension (idiopathic) with ulcer and inflammation of left lower extremity: Secondary | ICD-10-CM | POA: Diagnosis not present

## 2015-11-30 DIAGNOSIS — L97521 Non-pressure chronic ulcer of other part of left foot limited to breakdown of skin: Secondary | ICD-10-CM | POA: Diagnosis not present

## 2015-11-30 DIAGNOSIS — L03116 Cellulitis of left lower limb: Secondary | ICD-10-CM | POA: Insufficient documentation

## 2015-12-07 DIAGNOSIS — I87332 Chronic venous hypertension (idiopathic) with ulcer and inflammation of left lower extremity: Secondary | ICD-10-CM | POA: Diagnosis not present

## 2015-12-14 DIAGNOSIS — I87332 Chronic venous hypertension (idiopathic) with ulcer and inflammation of left lower extremity: Secondary | ICD-10-CM | POA: Diagnosis not present

## 2015-12-15 ENCOUNTER — Ambulatory Visit (HOSPITAL_BASED_OUTPATIENT_CLINIC_OR_DEPARTMENT_OTHER): Payer: Medicare Other | Admitting: Physical Medicine & Rehabilitation

## 2015-12-15 ENCOUNTER — Encounter: Payer: Self-pay | Admitting: Physical Medicine & Rehabilitation

## 2015-12-15 ENCOUNTER — Encounter: Payer: Medicare Other | Attending: Physical Medicine & Rehabilitation

## 2015-12-15 VITALS — BP 125/78 | HR 103 | Resp 17

## 2015-12-15 DIAGNOSIS — G808 Other cerebral palsy: Secondary | ICD-10-CM | POA: Diagnosis present

## 2015-12-15 DIAGNOSIS — Z87898 Personal history of other specified conditions: Secondary | ICD-10-CM | POA: Insufficient documentation

## 2015-12-15 DIAGNOSIS — F319 Bipolar disorder, unspecified: Secondary | ICD-10-CM | POA: Diagnosis not present

## 2015-12-15 DIAGNOSIS — R32 Unspecified urinary incontinence: Secondary | ICD-10-CM | POA: Diagnosis not present

## 2015-12-15 DIAGNOSIS — R252 Cramp and spasm: Secondary | ICD-10-CM | POA: Diagnosis not present

## 2015-12-15 DIAGNOSIS — G8250 Quadriplegia, unspecified: Secondary | ICD-10-CM | POA: Diagnosis not present

## 2015-12-15 DIAGNOSIS — K219 Gastro-esophageal reflux disease without esophagitis: Secondary | ICD-10-CM | POA: Insufficient documentation

## 2015-12-15 DIAGNOSIS — E7800 Pure hypercholesterolemia, unspecified: Secondary | ICD-10-CM | POA: Diagnosis not present

## 2015-12-15 DIAGNOSIS — R532 Functional quadriplegia: Secondary | ICD-10-CM | POA: Insufficient documentation

## 2015-12-15 DIAGNOSIS — F419 Anxiety disorder, unspecified: Secondary | ICD-10-CM | POA: Insufficient documentation

## 2015-12-15 NOTE — Progress Notes (Signed)
Botox Injection for spasticity using needle EMG guidance  Dilution: 50 Units/ml Indication: Severe spasticity which interferes with ADL,mobility and/or  hygiene and is unresponsive to medication management and other conservative care Informed consent was obtained after describing risks and benefits of the procedure with the patient. This includes bleeding, bruising, infection, excessive weakness, or medication side effects. A REMS form is on file and signed. Needle: 27g 1" needle electrode Number of units per muscle  Biceps150 Brachioradialis50 All injections were done after obtaining appropriate EMG activity and after negative drawback for blood. The patient tolerated the procedure well. Post procedure instructions were given. A followup appointment was made.

## 2015-12-15 NOTE — Patient Instructions (Signed)

## 2015-12-25 DIAGNOSIS — I87332 Chronic venous hypertension (idiopathic) with ulcer and inflammation of left lower extremity: Secondary | ICD-10-CM | POA: Diagnosis not present

## 2016-01-04 ENCOUNTER — Ambulatory Visit (INDEPENDENT_AMBULATORY_CARE_PROVIDER_SITE_OTHER): Payer: Medicare Other | Admitting: Student in an Organized Health Care Education/Training Program

## 2016-01-04 ENCOUNTER — Encounter: Payer: Self-pay | Admitting: Student in an Organized Health Care Education/Training Program

## 2016-01-04 VITALS — BP 131/61 | HR 94 | Temp 98.0°F

## 2016-01-04 DIAGNOSIS — R32 Unspecified urinary incontinence: Secondary | ICD-10-CM

## 2016-01-04 DIAGNOSIS — N390 Urinary tract infection, site not specified: Secondary | ICD-10-CM | POA: Diagnosis not present

## 2016-01-04 DIAGNOSIS — L03116 Cellulitis of left lower limb: Secondary | ICD-10-CM

## 2016-01-04 LAB — POCT URINALYSIS DIPSTICK
Blood, UA: NEGATIVE
GLUCOSE UA: NEGATIVE
KETONES UA: 15
Leukocytes, UA: NEGATIVE
Nitrite, UA: NEGATIVE
SPEC GRAV UA: 1.02
Urobilinogen, UA: >=8
pH, UA: 7

## 2016-01-04 MED ORDER — DOXYCYCLINE HYCLATE 100 MG PO TABS: 100.0000 mg | ORAL_TABLET | Freq: Two times a day (BID) | ORAL | 0 refills | Status: DC

## 2016-01-04 MED ORDER — CEPHALEXIN 500 MG PO CAPS: 500.0000 mg | ORAL_CAPSULE | Freq: Two times a day (BID) | ORAL | 0 refills | Status: DC

## 2016-01-04 NOTE — Assessment & Plan Note (Addendum)
Complaints of dysuria and increased urinary incontinence concerning for UTI. Patient has had UTI in the past with cultures sensitive to [facility]. - unable to collect urine until end of visit (CMA and home health aid pushed on the patient's bladder for 45 minutes before he was able to urinate. - after patient urinated he was cathetarized to determine post-void residual and no urine was obtained suggesting the patient is not likely retaining urine. - will put in UA and UCx, will empirically start antibiotics pending results. - treat with Kephlex for 10 days    Note: Later after patient left, UA results came back negative leukocytes, negative nitrites.  Patient to follow up in our clinic tomorrow for wound check of his cellulitis. Spoke with resident seeing patient tomorrow and she agreed to re-evaluate antibiotic therapy with the attending physician at that visit now that negative UA results are available.

## 2016-01-04 NOTE — Patient Instructions (Addendum)
It was a pleasure seeing you today in our clinic. Today we discussed UTI and wound care. Here is the treatment plan we have discussed and agreed upon together:  Cellulitis: - Please follow up in our office tomorrow 8/11 for a wound check, and see your skin graft doctor as soon as possible. - Please take the antibiotic prescribed twice daily for ten days.   UTI - Please take the antibiotic prescribed twice daily for ten days.  If your symptoms worsen, or if you develop fevers, please call us back.  Our clinic's number is [phone]. Please call with questions or concerns about what we discussed today.  - Dr. Mosetta PuttFeng

## 2016-01-04 NOTE — Progress Notes (Signed)
CC: Incontinence and increased leg pain  HPI: History provided with help of health aid who is present with patient at this visit.  Urinary symptoms: [name] is a 55 y.o. male with a history of intellectual disability [facility] urinary incontinence of one weeks duration consistent with previous UTI symptoms. At baseline patient is usually continent with occasional accidents.  Patient endorses dysuria, but is unable to respond when asked about increased frequency or urgency. No subjective fevers, patient denies nausea or vomiting.  No hematuria.  Patient denies discharge or irritants.  His health aid endorses a decrease in mentation from baseline over past few days with patient responding to fewer questions than usual.  Left lower extremity symptoms: Patient with history of left leg spiral fracture as well as recent skin graft over multiple foot ulcers.  Patient has been complaining about increased pain in the lower leg, complains of warmth in the leg.   No fevers.  Patient seen by wound care twice per week and has appointment with skin graft doctor in one week.   Review of Symptoms: See HPI for ROS.   CC, SH/smoking status, and VS noted.  Objective: BP 131/61   Pulse 94   Temp 98 F (36.7 C) (Oral)  GEN: NAD, alert, cooperative, and pleasant. EYE: no conjunctival injection, pupils equally round and reactive to light NECK: full ROM RESPIRATORY: clear to auscultation bilaterally with no wheezes, rhonchi or rales, good effort CV: RRR, no m/r/g, no peripheral edema GI: soft, non-tender, non-distended, normoactive bowel sounds, no hepatosplenomegaly GU: No penile discharge, no genital skin changes, no CVA tenderness Left Lower extremity: with two clean, dry  and clear- bandaged ulcers over medial aspect of the foot as well as dorsal large toe.  +Warmth and mild erythema up leg to mid shin.      Note, this picture was taken from a computer screen and the leg  looks slightly redder in the picture than in person.  Assessment and plan:  URINARY INCONTINENCE Complaints of dysuria and increased urinary incontinence concerning for UTI. Patient has had UTI in the past with cultures sensitive to [facility]. - unable to collect urine until end of visit (CMA and home health aid pushed on the patient's bladder for 45 minutes before he was able to urinate. - after patient urinated he was cathetarized to determine post-void residual and no urine was obtained suggesting the patient is not likely retaining urine. - will put in UA and UCx, will empirically start antibiotics pending results. - treat with Kephlex for 10 days    Note: Later after patient left, UA results came back negative leukocytes, negative nitrites.  Patient to follow up in our clinic tomorrow for wound check of his cellulitis. Spoke with resident seeing patient tomorrow and she agreed to re-evaluate antibiotic therapy with the attending physician at that visit now that negative UA results are available.  Cellulitis of leg, left Recent skin grafts for multiple ulcers on the left foot, now with increased warmth and erythema to mid-shin is concerning for left lower extremity cellulitis. - skin pen was used to mark the erythema, picture was included in the chart.  (Picture quality does make leg look slightly redder than in life) - started doxycycline 100 mg BID for cellulitis - patient asked to follow up in our clinic in one day for wound recheck. - no fevers, vitals stable, so low suspicion for systemic infection at this point.   Orders Placed This  Encounter  Procedures  . Urine culture  . Urinalysis Dipstick    Meds ordered this encounter  Medications  . doxycycline (VIBRA-TABS) 100 MG tablet    Sig: Take 1 tablet (100 mg total) by mouth 2 (two) times daily.    Dispense:  20 tablet    Refill:  0  . cephALEXin (KEFLEX) 500 MG capsule    Sig: Take 1 capsule (500 mg total) by mouth 2 (two)  times daily.    Dispense:  20 capsule    Refill:  0     Howard Pouch, MD,MS,  PGY1 01/04/2016 9:35 PM

## 2016-01-04 NOTE — Assessment & Plan Note (Addendum)
Recent skin grafts for multiple ulcers on the left foot, now with increased warmth and erythema to mid-shin is concerning for left lower extremity cellulitis. - skin pen was used to mark the erythema, picture was included in the chart.  (Picture quality does make leg look slightly redder than in life) - started doxycycline 100 mg BID for cellulitis - patient asked to follow up in our clinic in one day for wound recheck. - no fevers, vitals stable, so low suspicion for systemic infection at this point.

## 2016-01-05 ENCOUNTER — Ambulatory Visit (INDEPENDENT_AMBULATORY_CARE_PROVIDER_SITE_OTHER): Payer: Medicare Other | Admitting: Family Medicine

## 2016-01-05 ENCOUNTER — Encounter: Payer: Self-pay | Admitting: Family Medicine

## 2016-01-05 VITALS — BP 135/78 | HR 103 | Temp 98.4°F

## 2016-01-05 DIAGNOSIS — L03116 Cellulitis of left lower limb: Secondary | ICD-10-CM

## 2016-01-05 LAB — URINE CULTURE: ORGANISM ID, BACTERIA: NO GROWTH

## 2016-01-05 NOTE — Progress Notes (Signed)
   HPI  [name] is seen today for follow up of L leg cellulitis s/p skin grafts for ulcers on L foot. Skin was marked yesterday in clinic. He denies systemic symptoms of fever, nausea, or vomiting. Received 2 doses of doxycycline so far.   CC: L leg cellulitis  CC, SH/smoking status, and VS noted  Objective: BP 135/78   Pulse (!) 103   Temp 98.4 F (36.9 C) (Oral)  Gen: NAD, alert, cooperative, and pleasant. HEENT: NCAT CV: RRR, no murmur Resp: CTAB, no wheezes, non-labored Ext: No edema, warm. Left leg cellulitis extending 3 inches above the skin markings, mildly erythematous, warm and mildly TTP.  Neuro: Alert and oriented, Speech clear, No gross deficits  Assessment and plan:  Cellulitis of leg, left Seen yesterday for L leg cellulitis s/p skin grafts. Mild extension of redness about 3 inches beyond skin markings. No fever or systemic symptoms. Pt has only received 2 doses of doxycycline. Will continue current course and f/u as needed for worsening.   UTI: He also complained of dysuria yesterday and was prescribed keflex. UA ended up showing no signs of infection, so will d/c keflex.   Loni MuseKate Shamon Cothran, MD, PGY1 01/05/2016 2:19 PM

## 2016-01-05 NOTE — Patient Instructions (Addendum)
It was a pleasure to see you today! Please follow up if your leg does not get less red, less painful, and less warm over the next 7 days. Stop taking the Keflex, the medicine for your UTI. Call us if you feel sick overall, including fever, nausea, or vomiting.

## 2016-01-05 NOTE — Assessment & Plan Note (Signed)
Seen yesterday for L leg cellulitis s/p skin grafts. Mild extension of redness about 3 inches beyond skin markings. No fever or systemic symptoms. Pt has only received 2 doses of doxycycline. Will continue current course and f/u as needed for worsening.

## 2016-01-08 ENCOUNTER — Emergency Department (HOSPITAL_COMMUNITY)
Admit: 2016-01-08 | Discharge: 2016-01-08 | Disposition: A | Discharge status: Home / Self Care | Payer: Medicare Other | Attending: Emergency Medicine | Admitting: Emergency Medicine

## 2016-01-08 ENCOUNTER — Encounter (HOSPITAL_COMMUNITY): Payer: Self-pay | Admitting: Emergency Medicine

## 2016-01-08 ENCOUNTER — Inpatient Hospital Stay (HOSPITAL_COMMUNITY)
Admission: EM | Admit: 2016-01-08 | Discharge: 2016-01-18 | DRG: 602 | Disposition: A | Discharge status: Skilled Nursing Facility | Payer: Medicare Other | Attending: Family Medicine | Admitting: Family Medicine

## 2016-01-08 DIAGNOSIS — M7989 Other specified soft tissue disorders: Secondary | ICD-10-CM

## 2016-01-08 DIAGNOSIS — B029 Zoster without complications: Secondary | ICD-10-CM

## 2016-01-08 DIAGNOSIS — R7303 Prediabetes: Secondary | ICD-10-CM | POA: Diagnosis present

## 2016-01-08 DIAGNOSIS — L0390 Cellulitis, unspecified: Secondary | ICD-10-CM | POA: Diagnosis present

## 2016-01-08 DIAGNOSIS — L89899 Pressure ulcer of other site, unspecified stage: Secondary | ICD-10-CM | POA: Diagnosis present

## 2016-01-08 DIAGNOSIS — F319 Bipolar disorder, unspecified: Secondary | ICD-10-CM | POA: Diagnosis present

## 2016-01-08 DIAGNOSIS — W228XXA Striking against or struck by other objects, initial encounter: Secondary | ICD-10-CM | POA: Diagnosis not present

## 2016-01-08 DIAGNOSIS — M8660 Other chronic osteomyelitis, unspecified site: Secondary | ICD-10-CM | POA: Diagnosis present

## 2016-01-08 DIAGNOSIS — M79609 Pain in unspecified limb: Secondary | ICD-10-CM | POA: Diagnosis not present

## 2016-01-08 DIAGNOSIS — I82532 Chronic embolism and thrombosis of left popliteal vein: Secondary | ICD-10-CM | POA: Diagnosis present

## 2016-01-08 DIAGNOSIS — Z8711 Personal history of peptic ulcer disease: Secondary | ICD-10-CM

## 2016-01-08 DIAGNOSIS — M79605 Pain in left leg: Secondary | ICD-10-CM | POA: Diagnosis not present

## 2016-01-08 DIAGNOSIS — Z89411 Acquired absence of right great toe: Secondary | ICD-10-CM

## 2016-01-08 DIAGNOSIS — Z515 Encounter for palliative care: Secondary | ICD-10-CM

## 2016-01-08 DIAGNOSIS — K219 Gastro-esophageal reflux disease without esophagitis: Secondary | ICD-10-CM | POA: Diagnosis present

## 2016-01-08 DIAGNOSIS — L89523 Pressure ulcer of left ankle, stage 3: Secondary | ICD-10-CM | POA: Diagnosis present

## 2016-01-08 DIAGNOSIS — G808 Other cerebral palsy: Secondary | ICD-10-CM | POA: Diagnosis present

## 2016-01-08 DIAGNOSIS — Z79899 Other long term (current) drug therapy: Secondary | ICD-10-CM

## 2016-01-08 DIAGNOSIS — L03116 Cellulitis of left lower limb: Secondary | ICD-10-CM | POA: Diagnosis present

## 2016-01-08 DIAGNOSIS — E7800 Pure hypercholesterolemia, unspecified: Secondary | ICD-10-CM | POA: Diagnosis present

## 2016-01-08 DIAGNOSIS — S82242A Displaced spiral fracture of shaft of left tibia, initial encounter for closed fracture: Secondary | ICD-10-CM | POA: Diagnosis present

## 2016-01-08 DIAGNOSIS — I82512 Chronic embolism and thrombosis of left femoral vein: Secondary | ICD-10-CM | POA: Diagnosis present

## 2016-01-08 DIAGNOSIS — F79 Unspecified intellectual disabilities: Secondary | ICD-10-CM | POA: Diagnosis present

## 2016-01-08 DIAGNOSIS — Z66 Do not resuscitate: Secondary | ICD-10-CM | POA: Diagnosis present

## 2016-01-08 DIAGNOSIS — K5900 Constipation, unspecified: Secondary | ICD-10-CM | POA: Diagnosis present

## 2016-01-08 DIAGNOSIS — F419 Anxiety disorder, unspecified: Secondary | ICD-10-CM | POA: Diagnosis present

## 2016-01-08 DIAGNOSIS — L700 Acne vulgaris: Secondary | ICD-10-CM | POA: Diagnosis present

## 2016-01-08 DIAGNOSIS — G629 Polyneuropathy, unspecified: Secondary | ICD-10-CM | POA: Diagnosis present

## 2016-01-08 DIAGNOSIS — M25572 Pain in left ankle and joints of left foot: Secondary | ICD-10-CM

## 2016-01-08 LAB — BASIC METABOLIC PANEL
Anion gap: 10 (ref 5–15)
BUN: 9 mg/dL (ref 4–21)
BUN: 9 mg/dL (ref 6–20)
CO2: 24 mmol/L (ref 22–32)
CREATININE: 0.5 mg/dL — AB (ref 0.6–1.3)
Calcium: 9.3 mg/dL (ref 8.9–10.3)
Chloride: 106 mmol/L (ref 101–111)
Creatinine, Ser: 0.52 mg/dL — ABNORMAL LOW (ref 0.61–1.24)
GFR calc Af Amer: >60 mL/min (ref >60)
GFR calc non Af Amer: >60 mL/min (ref >60)
GLUCOSE: 109 mg/dL
Glucose, Bld: 109 mg/dL — ABNORMAL HIGH (ref 65–99)
POTASSIUM: 3.8 mmol/L (ref 3.4–5.3)
Potassium: 3.8 mmol/L (ref 3.5–5.1)
SODIUM: 140 mmol/L (ref 137–147)
Sodium: 140 mmol/L (ref 135–145)

## 2016-01-08 LAB — CREATININE, SERUM
Creatinine, Ser: 0.58 mg/dL — ABNORMAL LOW (ref 0.61–1.24)
GFR calc Af Amer: >60 mL/min (ref >60)

## 2016-01-08 LAB — CBC WITH DIFFERENTIAL/PLATELET
Basophils Absolute: 0 10*3/uL (ref 0.0–0.1)
Basophils Relative: 0 %
Eosinophils Absolute: 0.3 10*3/uL (ref 0.0–0.7)
Eosinophils Relative: 4 %
HCT: 34.2 % — ABNORMAL LOW (ref 39.0–52.0)
Hemoglobin: 10.8 g/dL — ABNORMAL LOW (ref 13.0–17.0)
Lymphocytes Relative: 31 %
Lymphs Abs: 2.3 10*3/uL (ref 0.7–4.0)
MCH: 25.2 pg — ABNORMAL LOW (ref 26.0–34.0)
MCHC: 31.6 g/dL (ref 30.0–36.0)
MCV: 79.7 fL (ref 78.0–100.0)
Monocytes Absolute: 0.7 10*3/uL (ref 0.1–1.0)
Monocytes Relative: 9 %
Neutro Abs: 4.1 10*3/uL (ref 1.7–7.7)
Neutrophils Relative %: 56 %
Platelets: 299 10*3/uL (ref 150–400)
RBC: 4.29 MIL/uL (ref 4.22–5.81)
RDW: 16 % — ABNORMAL HIGH (ref 11.5–15.5)
WBC: 7.3 10*3/uL (ref 4.0–10.5)

## 2016-01-08 LAB — CBC
HEMATOCRIT: 34.8 % — AB (ref 39.0–52.0)
HEMOGLOBIN: 10.8 g/dL — AB (ref 13.0–17.0)
MCH: 24.8 pg — AB (ref 26.0–34.0)
MCHC: 31 g/dL (ref 30.0–36.0)
MCV: 79.8 fL (ref 78.0–100.0)
Platelets: 315 10*3/uL (ref 150–400)
RBC: 4.36 MIL/uL (ref 4.22–5.81)
RDW: 16 % — ABNORMAL HIGH (ref 11.5–15.5)
WBC: 8.7 10*3/uL (ref 4.0–10.5)

## 2016-01-08 LAB — CBC AND DIFFERENTIAL
HCT: 34 % — AB (ref 41–53)
HEMOGLOBIN: 10.8 g/dL — AB (ref 13.5–17.5)
Platelets: 299 10*3/uL (ref 150–399)
WBC: 7.3 10*3/mL

## 2016-01-08 MED ORDER — VANCOMYCIN HCL 10 G IV SOLR
1500.0000 mg | Freq: Once | INTRAVENOUS | Status: AC
  Administered 2016-01-08: 1500 mg via INTRAVENOUS
  Filled 2016-01-08: qty 1500

## 2016-01-08 MED ORDER — OXYCODONE-ACETAMINOPHEN 5-325 MG PO TABS
1.0000 | ORAL_TABLET | Freq: Once | ORAL | Status: AC
  Administered 2016-01-08: 1 via ORAL
  Filled 2016-01-08 (×2): qty 1

## 2016-01-08 MED ORDER — FESOTERODINE FUMARATE ER 4 MG PO TB24
4.0000 mg | ORAL_TABLET | Freq: Every day | ORAL | Status: DC
  Administered 2016-01-08 – 2016-01-17 (×10): 4 mg via ORAL
  Filled 2016-01-08 (×11): qty 1

## 2016-01-08 MED ORDER — DIVALPROEX SODIUM 125 MG PO CPSP: 500.0000 mg | ORAL_CAPSULE | Freq: Two times a day (BID) | ORAL | Status: DC

## 2016-01-08 MED ORDER — CLINDAMYCIN PHOSPHATE 600 MG/50ML IV SOLN
600.0000 mg | Freq: Three times a day (TID) | INTRAVENOUS | Status: DC
  Administered 2016-01-08 – 2016-01-10 (×6): 600 mg via INTRAVENOUS
  Filled 2016-01-08 (×6): qty 50

## 2016-01-08 MED ORDER — VANCOMYCIN HCL 10 G IV SOLR
1250.0000 mg | Freq: Two times a day (BID) | INTRAVENOUS | Status: DC
  Filled 2016-01-08: qty 1250

## 2016-01-08 MED ORDER — HYDROCORTISONE ACETATE 1 % EX CREA: 1.0000 "application " | TOPICAL_CREAM | Freq: Every day | CUTANEOUS | Status: DC

## 2016-01-08 MED ORDER — PANTOPRAZOLE SODIUM 40 MG PO TBEC
40.0000 mg | DELAYED_RELEASE_TABLET | Freq: Every day | ORAL | Status: DC
  Administered 2016-01-09 – 2016-01-18 (×10): 40 mg via ORAL
  Filled 2016-01-08 (×11): qty 1

## 2016-01-08 MED ORDER — DIVALPROEX SODIUM 125 MG PO CSDR
500.0000 mg | DELAYED_RELEASE_CAPSULE | Freq: Two times a day (BID) | ORAL | Status: DC
  Administered 2016-01-08 – 2016-01-18 (×21): 500 mg via ORAL
  Filled 2016-01-08 (×22): qty 4

## 2016-01-08 MED ORDER — TRAMADOL HCL 50 MG PO TABS
50.0000 mg | ORAL_TABLET | Freq: Four times a day (QID) | ORAL | Status: DC | PRN
  Administered 2016-01-08 – 2016-01-13 (×8): 50 mg via ORAL
  Filled 2016-01-08 (×8): qty 1

## 2016-01-08 MED ORDER — ACETAMINOPHEN 325 MG PO TABS
650.0000 mg | ORAL_TABLET | Freq: Four times a day (QID) | ORAL | Status: DC | PRN
  Administered 2016-01-09: 650 mg via ORAL
  Filled 2016-01-08: qty 2

## 2016-01-08 MED ORDER — TIZANIDINE HCL 2 MG PO TABS
2.0000 mg | ORAL_TABLET | Freq: Three times a day (TID) | ORAL | Status: DC
Start: 2016-01-08 — End: 2016-01-18
  Administered 2016-01-08 – 2016-01-18 (×30): 2 mg via ORAL
  Filled 2016-01-08 (×30): qty 1

## 2016-01-08 MED ORDER — LANSOPRAZOLE 30 MG PO TBDP: 30.0000 mg | ORAL_TABLET | Freq: Every day | ORAL | Status: DC

## 2016-01-08 MED ORDER — LORATADINE 10 MG PO TABS
10.0000 mg | ORAL_TABLET | Freq: Every day | ORAL | Status: DC
  Administered 2016-01-08 – 2016-01-18 (×11): 10 mg via ORAL
  Filled 2016-01-08 (×11): qty 1

## 2016-01-08 MED ORDER — RISPERIDONE 2 MG PO TABS
2.0000 mg | ORAL_TABLET | Freq: Every day | ORAL | Status: DC
  Administered 2016-01-08 – 2016-01-17 (×10): 2 mg via ORAL
  Filled 2016-01-08 (×11): qty 1

## 2016-01-08 MED ORDER — BACLOFEN 10 MG PO TABS
20.0000 mg | ORAL_TABLET | Freq: Four times a day (QID) | ORAL | Status: DC
  Administered 2016-01-08 – 2016-01-18 (×39): 20 mg via ORAL
  Filled 2016-01-08 (×40): qty 2

## 2016-01-08 MED ORDER — HYDROCORTISONE 1 % EX CREA
TOPICAL_CREAM | Freq: Every day | CUTANEOUS | Status: DC
  Administered 2016-01-08 – 2016-01-18 (×6): via TOPICAL
  Filled 2016-01-08: qty 28

## 2016-01-08 MED ORDER — ENOXAPARIN SODIUM 40 MG/0.4ML ~~LOC~~ SOLN
40.0000 mg | SUBCUTANEOUS | Status: DC
  Administered 2016-01-08: 40 mg via SUBCUTANEOUS
  Filled 2016-01-08: qty 0.4

## 2016-01-08 NOTE — ED Notes (Signed)
Patient states he is on an oral antibiotic

## 2016-01-08 NOTE — ED Notes (Signed)
Assisted patient with urinal.

## 2016-01-08 NOTE — ED Notes (Signed)
RN assisted patient with using the urinal.

## 2016-01-08 NOTE — ED Notes (Signed)
RN attempted report 

## 2016-01-08 NOTE — ED Notes (Signed)
Admitting at bedside 

## 2016-01-08 NOTE — ED Notes (Signed)
Patient sent to vas by another staff member.

## 2016-01-08 NOTE — Progress Notes (Signed)
Pharmacy Antibiotic Note  [name] is a 55 y.o. male admitted on 01/08/2016 with cellulitis.  Pharmacy has been consulted for vancomycin dosing.  Patient with history of left leg spiral fracture as well as recent skin graft over multiple foot ulcers.  Patient has been complaining about increased pain in the lower leg, complains of warmth in the leg. Given patients age and low muscle mass, dosing will be more conservative.  Plan: Vancomycin 1250 mg IV every 12 hours.  Goal trough 10-15 mcg/mL.  Monitor labs and F/U troughs as indicated    Temp (24hrs), Avg:98.1 F (36.7 C), Min:98.1 F (36.7 C), Max:98.1 F (36.7 C)  No results for input(s): WBC, CREATININE, LATICACIDVEN, VANCOTROUGH, VANCOPEAK, VANCORANDOM, GENTTROUGH, GENTPEAK, GENTRANDOM, TOBRATROUGH, TOBRAPEAK, TOBRARND, AMIKACINPEAK, AMIKACINTROU, AMIKACIN in the last 168 hours.  CrCl cannot be calculated (Unknown ideal weight.).    Allergies  Allergen Reactions  . Adhesive [Tape] Rash    Antimicrobials this admission: Vancomycin 8/14 >>   Dose adjustments this admission: N/A  Microbiology results: N/A  Thank you for allowing pharmacy to be a part of this patient's care.  Ruben Imony Ahmed Inniss, PharmD Clinical Pharmacist Pager: [phone] 01/08/2016 10:58 AM
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
## 2016-01-08 NOTE — ED Notes (Signed)
Myself, Jesse SansJanee', RN and Shanda BumpsJessica, RN assisted patient from wheelchair and changed his clothes and placed him on a bedpan; myself and Janee', RN readjusted patient on bedpan for he is still having a bowel movement; placed patient on continuous pulse oximetry and blood pressure cuff; homehealth aide at bedside; patient will ring out when he is finished on bedpan
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
## 2016-01-08 NOTE — ED Notes (Signed)
Patient  leg noted to be red and tender to touch. Leg is not warm.

## 2016-01-08 NOTE — Progress Notes (Signed)
VASCULAR LAB PRELIMINARY  PRELIMINARY  PRELIMINARY  PRELIMINARY  Left lower extremity venous duplex   completed.    Preliminary report:  Dificult to image due to suboptimal position of the patient. No obvious evidence of acute DVT in the veins imaged. Positive for chronic DVT noted in the left common femoral, femoral, popliteal, most proximal calf. Unable to fully image the posterior tibial vein at the ankle and unable to image to peroneal vein or artery.. No obvious evidence of a superficial thrombus. There is enlargement of the inguinal lymph nodes and extensive fluid noted from the popliteal region to the ankle.  Davionne Dowty, RVS 01/08/2016, 11:43 AM

## 2016-01-08 NOTE — ED Triage Notes (Signed)
Is on antibiotic for cellulitits on his foot, and aid states he may be having reaction not acting himself but leg has been getting more red

## 2016-01-09 LAB — CBC
HEMATOCRIT: 33.9 % — AB (ref 39.0–52.0)
HEMOGLOBIN: 10.5 g/dL — AB (ref 13.0–17.0)
MCH: 25 pg — ABNORMAL LOW (ref 26.0–34.0)
MCHC: 31 g/dL (ref 30.0–36.0)
MCV: 80.7 fL (ref 78.0–100.0)
Platelets: 314 10*3/uL (ref 150–400)
RBC: 4.2 MIL/uL — ABNORMAL LOW (ref 4.22–5.81)
RDW: 16.2 % — AB (ref 11.5–15.5)
WBC: 7.8 10*3/uL (ref 4.0–10.5)

## 2016-01-09 LAB — BASIC METABOLIC PANEL
ANION GAP: 12 (ref 5–15)
BUN: 13 mg/dL (ref 6–20)
CALCIUM: 9 mg/dL (ref 8.9–10.3)
CO2: 25 mmol/L (ref 22–32)
Chloride: 104 mmol/L (ref 101–111)
Creatinine, Ser: 0.52 mg/dL — ABNORMAL LOW (ref 0.61–1.24)
GLUCOSE: 85 mg/dL (ref 65–99)
POTASSIUM: 4.4 mmol/L (ref 3.5–5.1)
Sodium: 141 mmol/L (ref 135–145)

## 2016-01-09 LAB — HEPARIN LEVEL (UNFRACTIONATED): Heparin Unfractionated: 0.13 IU/mL — ABNORMAL LOW (ref 0.30–0.70)

## 2016-01-09 MED ORDER — HEPARIN BOLUS VIA INFUSION
3500.0000 [IU] | Freq: Once | INTRAVENOUS | Status: AC
  Administered 2016-01-09: 3500 [IU] via INTRAVENOUS
  Filled 2016-01-09: qty 3500

## 2016-01-09 MED ORDER — POLYETHYLENE GLYCOL 3350 17 G PO PACK
17.0000 g | PACK | Freq: Every day | ORAL | Status: DC | PRN
  Administered 2016-01-11: 17 g via ORAL
  Filled 2016-01-09: qty 1

## 2016-01-09 MED ORDER — HEPARIN (PORCINE) IN NACL 100-0.45 UNIT/ML-% IJ SOLN
1400.0000 [IU]/h | INTRAMUSCULAR | Status: DC
  Administered 2016-01-09: 1050 [IU]/h via INTRAVENOUS
  Filled 2016-01-09 (×2): qty 250

## 2016-01-09 MED ORDER — HEPARIN BOLUS VIA INFUSION
2000.0000 [IU] | Freq: Once | INTRAVENOUS | Status: AC
  Administered 2016-01-09: 2000 [IU] via INTRAVENOUS
  Filled 2016-01-09: qty 2000

## 2016-01-09 NOTE — Progress Notes (Signed)
PT Screen and Discharge Note  Patient Details Name: [name] L Mcclane MRN: [id number] DOB: 1961/04/07   Cancelled Treatment:    Reason Eval/Treat Not Completed: PT screened, no needs identified, will sign off   [name] has been totally dependent for transfers OOB PTA; Uses a lift for getting OOB to wheelchair; Has Total Assist available to him at group home;  Discussed pt with nursing staff, who report he is not wanting OOB today;   Recommend nursing use the lift for getting OOB while here;   At baseline functional level; All further PT needs are deferred to next venue of care, worth considering HHPT follow up at group home;   Will sign off,  Van ClinesHolly Keydi Giel, PT  Acute Rehabilitation Services Pager [phone] Office [phone]      Van ClinesGarrigan, Annalee Meyerhoff [facility] 01/09/2016, 2:34 PM

## 2016-01-09 NOTE — Care Management Note (Signed)
Case Management Note  Patient Details  Name: [name] MRN: [id number] Date of Birth: Jun 17, 1960  Subjective/Objective:     CM following for progression and d/c planning.                Action/Plan: 01/09/2016 Pt from Group Home, requires total care, CSW V Crawford following. No HH or DME needs.   Expected Discharge Date:                  Expected Discharge Plan:  Group Home  In-House Referral:  Clinical Social Work  Discharge planning Services  NA  Post Acute Care Choice:  NA Choice offered to:  NA  DME Arranged:  N/A DME Agency:  NA  HH Arranged:  NA HH Agency:  NA  Status of Service:  Completed, signed off  If discussed at Long Length of Stay Meetings, dates discussed:    Additional Comments:  Starlyn SkeansRoyal, Adolphus Hanf U, RN 01/09/2016, 3:54 PM

## 2016-01-09 NOTE — Progress Notes (Signed)
Family Medicine Teaching Service Daily Progress Note Intern Pager: [phone]  Patient name: [name] Medical record number: [id number] Date of birth: 1961/03/01 Age: 55 y.o. Gender: male  Primary Care Provider: Tarri AbernethyAbigail J Lancaster, MD Consultants: none Code Status: FULL  Pt Overview and Major Events to Date:  8/14 Admitted for L LE cellulitis and pressure ulcer   Assessment and Plan: [name] Rhineerry L Breck is a 55 y.o. male presenting with LLE pain, swelling and increased warmth despite outpatient antibiotic for cellulitis. PMH is significant for Cerebral Palsy, Quadriplegia, hx gastric ulcer, Bipolar DO  Cellulitis of Left Lower extremity with Pressure Ulcer on Left Medial Ankle: h/o recurrent cellulitis in this area with recently failing outpt tx with 5 days of PO Doxycycline. Afebrile, no leukocytosis, VSS. 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 S/p Vanc in ED. - IV Clindamycin 600mg  q8 (8/14-) - Blood cultures (drawn after given IV Vanc in ED) pending - Tylenol/ Tramadol PRN pain  - start heparin for chronic L DVT  Pressure Ulcer on Left Ankle and Dorsum of Foot: s/p skin grafts; per ALF, has follow up appointment on 8/17. ALF reports daily dressing changes.  - wound care consulted   Quadriplegia/Cerebral Palsy:  - continue home meds baclofen, fesoterodine, zanaflex prn - PT consulted   Acne Vulgaris:  - home hydrocortisone acetate cream daily every PM   PreDM: not on medications currently. Last A1c 5.7 on 08/2015 - no indication to check CBG/SSI  Bipolar DO:  - Depakote 500mg  BID  Hx Of Gastric Ulcer:  - home prevacid   FEN/GI: heart healthy/carb mod; SLIV, protonix Prophylaxis: heparin   Disposition: pending  Subjective:  Patient states L leg feels only mildly better when he has it elevated but is still very painful with any movement and is  frustrated with the constant examinations. Denies fever/chills. States has been eating well, no n/v.  Objective: Temp:  [97.6 F (36.4 C)-98.4 F (36.9 C)] 98.1 F (36.7 C) (08/15 0542) Pulse Rate:  [74-89] 82 (08/15 0542) Resp:  [18-20] 19 (08/15 0542) BP: (107-148)/(61-93) 107/61 (08/15 0542) SpO2:  [98 %-100 %] 100 % (08/15 0542) Weight:  [68.1 kg (150 lb 1.6 oz)] 68.1 kg (150 lb 1.6 oz) (08/14 2010) Physical Exam: General: Sitting up in bed, in NAD Cardiovascular: RRR, no murmurs noted Respiratory: CTAB, normal effort Abdomen: soft, nt, nd, +bs Extremities: contractures; pitting edema noted on left LE 3/4 up shin. Painful to palpation. Erythema is not completely circumferential. Increased warmth noted. Additionally, ~stage 2 ulcer noted on medial L ankle and ulcer also on dorsum of Left foot covered by intact steristrips.  Laboratory:  Recent Labs Lab 01/08/16 1056 01/08/16 1558  WBC 7.3 8.7  HGB 10.8* 10.8*  HCT 34.2* 34.8*  PLT 299 315    Recent Labs Lab 01/08/16 1056 01/08/16 1558  NA 140  --   K 3.8  --   CL 106  --   CO2 24  --   BUN 9  --   CREATININE 0.52* 0.58*  CALCIUM 9.3  --   GLUCOSE 109*  --      Imaging/Diagnostic Tests: No results found.  Leland HerElsia J Joushua Dugar, DO 01/09/2016, 6:35 AM PGY-1, [facility] FPTS Intern pager: [phone], text pages welcome

## 2016-01-09 NOTE — Progress Notes (Signed)
ANTICOAGULATION CONSULT NOTE - Initial Consult  Pharmacy Consult for heparin  Indication: VTE  Allergies  Allergen Reactions  . Adhesive [Tape] Rash    Patient Measurements: Weight: 150 lb 1.6 oz (68.1 kg) Heparin Dosing Weight: 68 kg  Vital Signs: Temp: 99 F (37.2 C) (08/15 0831) Temp Source: Oral (08/15 0831) BP: 129/68 (08/15 0831) Pulse Rate: 95 (08/15 0831)  Labs:  Recent Labs  01/08/16 1056 01/08/16 1558 01/09/16 0643  HGB 10.8* 10.8* 10.5*  HCT 34.2* 34.8* 33.9*  PLT 299 315 314  CREATININE 0.52* 0.58* 0.52*    Estimated Creatinine Clearance: 100.5 mL/min (by C-G formula based on SCr of 0.8 mg/dL).   Medical History: Past Medical History:  Diagnosis Date  . Anxiety    takes Ativan daily as needed  . Bipolar disorder (HCC)    takes Depakote daily  . CEREBRAL PALSY 07/24/2006  . Chronic bronchitis (HCC)    "gets it q yr"  . Constipation    takes Miralax daily as needed  . Depression    takes Risperdal nightly  . Environmental allergies    takes Zyrtec daily  . GASTRIC ULCER ACUTE WITH HEMORRHAGE 07/24/2006  . GERD (gastroesophageal reflux disease)    takes Pravacid daily  . History of blood transfusion    no abnormal reaction noted  . History of hiatal hernia   . History of shingles   . History of stomach ulcers ~ 1996  . Hypercholesterolemia   . Pneumonia "quite a few times"   in 2015 and in June 2016  . Recurrent UTI (urinary tract infection)   . URINARY INCONTINENCE 02/09/2010   takes Detrol daily-occasionally incontinence  . 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 Pharmacy is consulted to start IV heparin. Baseline hgb 10.5, pltc wnl. Not on anticoagulation PTA   Goal of Therapy:  Heparin level 0.3-0.7 units/ml Monitor  platelets by anticoagulation protocol: Yes   Plan:  Heparin bolus 3500 units x 1 Heparin infusion 1050 units/hr F/u 6 hr heparin level at 1830 Daily heparin level and CBC F/u plans for oral anticoagulation  Bayard HuggerMei Shatiqua Heroux, PharmD, BCPS  Clinical Pharmacist  Pager: [phone]   01/09/2016,11:52 AM

## 2016-01-09 NOTE — Discharge Summary (Signed)
[facility] Discharge Summary  Patient name: [name] [name] Printup Medical record number: [id number] Date of birth: February 04, 1961 Age: 55 y.o. Gender: male Date of Admission: 01/08/2016  Date of Discharge: 01/18/16 Admitting Physician: Tobey GrimJeffrey H Walden, MD  Primary Care Provider: Tarri AbernethyAbigail J Lancaster, MD Consultants: none  Indication for Hospitalization: [name] [name] Cellulitis with Pressure Ulcer on [name] Medial Ankle  Discharge Diagnoses/Problem List:  [name] [name] Cellulitis Pressure Ulcer on [name] Medial Ankle and Dorsum of Foot Quadriplegia/Cerebral Palsy Acne Vulgaris Pre-Diabetes Bipolar d/o H/o gastric ulcer  Disposition: SNF  Discharge Condition: stable  Discharge Exam:  Cardiovascular: RRR, no M/r/g Respiratory: CTAB Abdomen: SNTND Extremities: R leg atophic and in offloading device. [name] leg splinted to midthigh.  Brief Hospital Course:  [name] Gesserry [name] Humbleis a 55 y.o.malepresenting with LLE pain, swelling and increased warmth despite outpatient antibiotic for cellulitis. PMH is significant for Cerebral Palsy, Quadriplegia, hx gastric ulcer, Bipolar DO  Cellulitis of Left Lower extremity with subsequent finding of new spiral tibia fracture Patient presented with worsening left lower extremity pain, swelling and warmth since 01/03/16. [facility] [place] and has h/o recurrent cellulitis in this area with recently failing outpt tx with 5 days of PO Doxycycline. He reported normal PO intake but noticed seemed to be slower with his ADLs and was little more agitated/confused. Wound care was consulted. He was given IV vancomycin x1 in the ED and then started on IV clindamycin. Blood cultures showed no growth x 4 days. Venous dopplers of LLE were ordered due to concern for persistent swelling and pain. 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 Pain persisted, and pt remembered that he hit his  foot on a wall a few weeks ago, which was the same mechanism of fracture from his other spiral fracture of the same leg in May. XR LLE was performed and found NEW spiral fracture with mild comminution, posterior displacement and angulation at the mid left tibia shaft. Ortho was consulted who placed a splint and recommended SNF placement 2/2 need for multiple staff to move him in order to keep the [name] leg immobile.  Ortho recommends non-weight-bearing status with bedside commode or bedpan . Needs one person to steady broken leg and two others to help with  transfer or hoyer lift and should be seen every two weeks to ensure no skin breakdown.     Pressure Ulcer on Left Medial Ankle and Dorsum of [name] foot Patient has been following wound care as an outpatient and had recent skin grafts per ALF with a follow up originally scheduled for follow up on 01/11/16. Wound care was consulted, who involved plastic surgery. Plastics felt only outpatient followup was necessary. Will need [facility] as he missed his due to continued admission.  Patient was started on vitamin C to help promote wound healing.  Shingles Patient noted to have a small vesicular rash on mid thigh the morning of discharge. Valtrex was initiated 1000 mg TID for a ten day course. SNF was notified that the patient was diagnosed with shingles and is being treated.  The remainder of his chronic medical conditions were stable and no changes were made to his home medications.  Issues for Follow Up:  1. Ensure proper Xarelto dosing - 13 more days at 15mg  BID (21 days total, change date 02/01/16), then 20mg  QD (through 04/12/2016) 2. Finish course  of PO clindamycin - through 01/21/16. 3. Schedule a follow up appointment to assess grafts [facility]. 4. For shingles, continue valtrex 1000 mg TID for a seven day course (Finish after three doses on 01/24/2016) 5. Schedule a follow up with orthopedics  Doctor Vira BrownsJames Nitka Peachtree [facility] Within one week of discharge. 6. Orthopedics recommendations: Patient is non-weight-bearing status with bedside commode or bedpan . Needs one person to steady broken leg and two others to help with  transfer or hoyer lift. Should be seen every two weeks to ensure no skin breakdown. 7. Patient may not know to ask for pain medications, please ask him regularly if he has pain so he can be given his PRN pain regimen. 8. Patient's home tramadol was resumed. Please be cautious using this medication with oxycodone as the combination may lead to  suppression of respiratory drive.  Significant Procedures: splinting of [name] lower extremity  Significant Labs and Imaging:   Recent Labs Lab 01/12/16 0322 01/15/16 1721  WBC 10.0 8.1  HGB 10.3* 11.2*  HCT 33.3* 34.0*  PLT 457* 554*   No results for input(s): NA, K, CL, CO2, GLUCOSE, BUN, CREATININE, CALCIUM, MG, PHOS, ALKPHOS, AST, ALT, ALBUMIN, PROTEIN in the last 168 hours.  Invalid input(s): TBILI  Results/Tests Pending at Time of Discharge: None  Discharge Medications:    Medication List    STOP taking these medications   doxycycline 100 MG tablet Commonly known as:  VIBRA-TABS   LORazepam 1 MG tablet Commonly known as:  ATIVAN     TAKE these medications   ascorbic acid 250 MG tablet Commonly known as:  VITAMIN C Take 1 tablet (250 mg total) by mouth daily.   baclofen 20 MG tablet Commonly known as:  LIORESAL Take 1 tablet (20 mg total) by mouth 4 (four) times daily.   cetirizine 10 MG tablet Commonly known as:  ZYRTEC Take 1 tablet (10 mg total) by mouth daily.   clindamycin 300 MG capsule Commonly known as:  CLEOCIN Take 1 capsule (300 mg total) by mouth every 8 (eight) hours.   clotrimazole-betamethasone cream Commonly known as:  LOTRISONE Apply 1 application topically See admin instructions. Apply to dressing change   divalproex 125 MG capsule Commonly known as:  DEPAKOTE  SPRINKLE Take 500 mg by mouth 2 (two) times daily.   Hydrocortisone Acetate 1 % Crea Apply 1 application topically daily. To face, every PM.   JOBST ANTI-EM KNEE HIGH MED Misc by Does not apply route. Apply once in the morning and remove at bedtime daily   ketoconazole 2 % shampoo Commonly known as:  NIZORAL USE AS DIRECTED TWICE WEEKLY   oxyCODONE-acetaminophen 5-325 MG tablet Commonly known as:  PERCOCET/ROXICET Take 1 tablet by mouth every 8 (eight) hours as needed for moderate pain.   polyethylene glycol powder powder Commonly known as:  GLYCOLAX/MIRALAX Take 17 g by mouth 2 (two) times daily as needed. What changed:  reasons to take this   PREVACID SOLUTAB 30 MG disintegrating tablet Generic drug:  lansoprazole DISSOLVE 1 TABLET ON TONGUE EVERY DAY   PREVALON HEEL PROTECTOR Misc 1 Device by Does not apply route daily.   risperiDONE 2 MG tablet Commonly known as:  RISPERDAL Take 2 mg by mouth at bedtime.   Rivaroxaban 15 MG Tabs tablet Commonly known as:  XARELTO Take 1 tablet (15 mg total) by mouth 2 (two) times daily with a meal.   rivaroxaban 20 MG Tabs tablet Commonly known as:  XARELTO Take 1 tablet (20 mg total) by mouth daily with supper. Start taking on:  02/01/2016   silver sulfADIAZINE 1 % cream Commonly known as:  SILVADENE Apply 1 application topically daily.   tiZANidine 4 MG tablet Commonly known as:  ZANAFLEX Take 2 mg by mouth 3 (three) times daily.   tolterodine 2 MG 24 hr capsule Commonly known as:  DETROL LA Take 1 capsule (2 mg total) by mouth daily.   traMADol 50 MG tablet Commonly known as:  ULTRAM Take 1 tablet (50 mg total) by mouth every 6 (six) hours as needed for severe pain.   tretinoin 0.025 % gel Commonly known as:  RETIN-A Apply 1 applicator topically at bedtime.   valACYclovir 1000 MG tablet Commonly known as:  VALTREX Take 1 tablet (1,000 mg total) by mouth 3 (three) times daily.   VOLTAREN 1 % Gel Generic drug:   diclofenac sodium APPLY 2GRAMS TOPICALLY TO BACK FOUR TIMES DAILY FOR BACK PAIN What changed:  See the new instructions.       Discharge Instructions: Please refer to Patient Instructions section of EMR for full details.  Patient was counseled important signs and symptoms that should prompt return to medical care, changes in medications, dietary instructions, activity restrictions, and follow up appointments.   Follow-Up Appointments: Follow-up Information    Kerrin Champagne, MD .   Specialty:  Orthopedic Surgery Why:  Please make a follow up appointment with orthopedics in about 1 week for the lower extremity fracture Contact information: [address] [place] [address] [phone]           Howard Pouch, MD 01/18/2016, 4:27 PM PGY-1, [facility]

## 2016-01-09 NOTE — Progress Notes (Signed)
ANTICOAGULATION CONSULT NOTE - Follow-Up Consult  Pharmacy Consult for heparin  Indication: subacute DVT  Allergies  Allergen Reactions  . Adhesive [Tape] Rash    Patient Measurements: Weight: 150 lb 1.6 oz (68.1 kg) Heparin Dosing Weight: 68 kg  Vital Signs: Temp: 98.5 F (36.9 C) (08/15 1723) Temp Source: Oral (08/15 1723) BP: 105/63 (08/15 1723) Pulse Rate: 81 (08/15 1723)  Labs:  Recent Labs  01/08/16 1056 01/08/16 1558 01/09/16 0643 01/09/16 1826  HGB 10.8* 10.8* 10.5*  --   HCT 34.2* 34.8* 33.9*  --   PLT 299 315 314  --   HEPARINUNFRC  --   --   --  0.13*  CREATININE 0.52* 0.58* 0.52*  --     Estimated Creatinine Clearance: 100.5 mL/min (by C-G formula based on SCr of 0.8 mg/dL).   Medical History: Past Medical History:  Diagnosis Date  . Anxiety    takes Ativan daily as needed  . Bipolar disorder (HCC)    takes Depakote daily  . CEREBRAL PALSY 07/24/2006  . Chronic bronchitis (HCC)    "gets it q yr"  . Constipation    takes Miralax daily as needed  . Depression    takes Risperdal nightly  . Environmental allergies    takes Zyrtec daily  . GASTRIC ULCER ACUTE WITH HEMORRHAGE 07/24/2006  . GERD (gastroesophageal reflux disease)    takes Pravacid daily  . History of blood transfusion    no abnormal reaction noted  . History of hiatal hernia   . History of shingles   . History of stomach ulcers ~ 1996  . Hypercholesterolemia   . Pneumonia "quite a few times"   in 2015 and in June 2016  . Recurrent UTI (urinary tract infection)   . URINARY INCONTINENCE 02/09/2010   takes Detrol daily-occasionally incontinence  . Weakness    numbness in extremities    Assessment: 54 YOM with hx of CP, quadriplegia, presented with LLE pain, swelling, and started IV clindamycin for cellulitis.  LE doppler was done and found chronic DVT in L common femoral, femoral, popliteal, most proximal calf. Patient was started on IV heparin 8/15.  Baseline hgb 10.5, pltc  wnl. Not on anticoagulation PTA.  Heparin level < goal on 1050 units/hr.  No IV issues noted.  No bleeding reported.  Goal of Therapy:  Heparin level 0.3-0.7 units/ml Monitor platelets by anticoagulation protocol: Yes   Plan:  Heparin bolus 2000 units x 1 Increase Heparin infusion 1250 units/hr F/u 6 hr heparin level at 0300 Daily heparin level and CBC starting 8/17 F/u plans for oral anticoagulation  Cala BradfordKimberly Hodges Treiber, Pharm.D., BCPS Clinical Pharmacist Pager [phone] 01/09/2016 8:06 PM

## 2016-01-09 NOTE — Consult Note (Signed)
WOC Nurse wound consult note Reason for Consult: Chronic venous stasis, has wound care at home  Wound type:  Unclear etiology  POA: Yes  Measurement:   Entire Left medial malleolus area has bright red coloration.  No drainage noted. Area has steri-strips to site.  Moving the extremity is very painful for the patient he states is related to a healing fracture.  Therefore, exact measurements are not possible at this time.  A small separate area on the dorsum of the foot just above the great toe also has steri strips.  This particular area has normally colored periwound skin.  No drainage noted.  Patient states he had skin grafts placed to both areas by "Dr. Roxan Hockeyobinson at a wound clinic".  Patient is uncertain of the product used for the grafting, but relates that the steri strips were not to be removed.  There is pitting edema to the extremity, and a palpable +2 dorsalis pedis pulse.    Recommendation:  Plastic surgery evaluation and treatment to sites.  The condition of the sites and recommendation was discussed via phone with Dr. Nelson ChimesAmin.  Discussed POC with patient and bedside nurse.   Re consult if needed, will not follow at this time. Thanks Helmut MusterSherry Wei Newbrough MSN, RN, CNS-BC, [employer]

## 2016-01-10 ENCOUNTER — Inpatient Hospital Stay (HOSPITAL_COMMUNITY): Payer: Medicare Other

## 2016-01-10 LAB — PROTIME-INR
INR: 1.08
PROTHROMBIN TIME: 14.1 s (ref 11.4–15.2)

## 2016-01-10 LAB — HEPARIN LEVEL (UNFRACTIONATED)
HEPARIN UNFRACTIONATED: 0.26 [IU]/mL — AB (ref 0.30–0.70)
Heparin Unfractionated: 0.39 IU/mL (ref 0.30–0.70)

## 2016-01-10 LAB — CBC
HCT: 30.6 % — ABNORMAL LOW (ref 39.0–52.0)
HEMOGLOBIN: 9.6 g/dL — AB (ref 13.0–17.0)
MCH: 25.1 pg — ABNORMAL LOW (ref 26.0–34.0)
MCHC: 31.4 g/dL (ref 30.0–36.0)
MCV: 80.1 fL (ref 78.0–100.0)
PLATELETS: 338 10*3/uL (ref 150–400)
RBC: 3.82 MIL/uL — ABNORMAL LOW (ref 4.22–5.81)
RDW: 16.2 % — ABNORMAL HIGH (ref 11.5–15.5)
WBC: 7.7 10*3/uL (ref 4.0–10.5)

## 2016-01-10 MED ORDER — RIVAROXABAN 15 MG PO TABS
15.0000 mg | ORAL_TABLET | Freq: Two times a day (BID) | ORAL | Status: DC
  Administered 2016-01-10 – 2016-01-18 (×17): 15 mg via ORAL
  Filled 2016-01-10 (×17): qty 1

## 2016-01-10 MED ORDER — HEPARIN (PORCINE) IN NACL 100-0.45 UNIT/ML-% IJ SOLN
1400.0000 [IU]/h | INTRAMUSCULAR | Status: AC
  Administered 2016-01-10: 1400 [IU]/h via INTRAVENOUS
  Filled 2016-01-10: qty 250

## 2016-01-10 MED ORDER — CLINDAMYCIN HCL 300 MG PO CAPS
300.0000 mg | ORAL_CAPSULE | Freq: Three times a day (TID) | ORAL | Status: DC
  Administered 2016-01-10 – 2016-01-18 (×24): 300 mg via ORAL
  Filled 2016-01-10 (×25): qty 1

## 2016-01-10 NOTE — Progress Notes (Signed)
ANTICOAGULATION CONSULT NOTE - Initial Consult  Pharmacy Consult for heparin  Indication: VTE  Allergies  Allergen Reactions  . Adhesive [Tape] Rash    Patient Measurements: Weight: 153 lb 11.2 oz (69.7 kg) Heparin Dosing Weight: 68 kg  Vital Signs: Temp: 98.7 F (37.1 C) (08/16 0855) Temp Source: Oral (08/16 0855) BP: 122/68 (08/16 0855) Pulse Rate: 92 (08/16 0855)  Labs:  Recent Labs  01/08/16 1056 01/08/16 1558 01/09/16 0643 01/09/16 1826 01/10/16 0321 01/10/16 0925  HGB 10.8* 10.8* 10.5*  --  9.6*  --   HCT 34.2* 34.8* 33.9*  --  30.6*  --   PLT 299 315 314  --  338  --   LABPROT  --   --   --   --  14.1  --   INR  --   --   --   --  1.08  --   HEPARINUNFRC  --   --   --  0.13* 0.26* 0.39  CREATININE 0.52* 0.58* 0.52*  --   --   --     Estimated Creatinine Clearance: 102.9 mL/min (by C-G formula based on SCr of 0.8 mg/dL).   Medical History: Past Medical History:  Diagnosis Date  . Anxiety    takes Ativan daily as needed  . Bipolar disorder (HCC)    takes Depakote daily  . CEREBRAL PALSY 07/24/2006  . Chronic bronchitis (HCC)    "gets it q yr"  . Constipation    takes Miralax daily as needed  . Depression    takes Risperdal nightly  . Environmental allergies    takes Zyrtec daily  . GASTRIC ULCER ACUTE WITH HEMORRHAGE 07/24/2006  . GERD (gastroesophageal reflux disease)    takes Pravacid daily  . History of blood transfusion    no abnormal reaction noted  . History of hiatal hernia   . History of shingles   . History of stomach ulcers ~ 1996  . Hypercholesterolemia   . Pneumonia "quite a few times"   in 2015 and in June 2016  . Recurrent UTI (urinary tract infection)   . URINARY INCONTINENCE 02/09/2010   takes Detrol daily-occasionally incontinence  . Weakness    numbness in extremities    Assessment: 54 YOM with hx of CP, quadriplegia, presented with LLE pain, swelling, and started IV clindamycin for cellulitis, LE doppler - chronic DVT  in L common femoral, femoral, popliteal, most proximal calf. Now on IV heparin, heparin level is now therapeutic = 0.39 on 1400 units/hr. hgb 9.6, pltc wnl. No bleeding noted per chart.   Goal of Therapy:  Heparin level 0.3-0.7 units/ml Monitor platelets by anticoagulation protocol: Yes   Plan:  Continue Heparin infusion 1400 units/hr confirmatory heparin level at 1600 Daily heparin level and CBC F/u plans for oral anticoagulation  Bayard HuggerMei Iktan Aikman, PharmD, BCPS  Clinical Pharmacist  Pager: [phone]  01/10/2016,11:10 AM

## 2016-01-10 NOTE — Progress Notes (Signed)
ANTICOAGULATION CONSULT NOTE - Follow Up Consult  Pharmacy Consult for heparin Indication: DVT  Labs:  Recent Labs  01/08/16 1056 01/08/16 1558 01/09/16 0643 01/09/16 1826 01/10/16 0321  HGB 10.8* 10.8* 10.5*  --  9.6*  HCT 34.2* 34.8* 33.9*  --  30.6*  PLT 299 315 314  --  338  LABPROT  --   --   --   --  14.1  INR  --   --   --   --  1.08  HEPARINUNFRC  --   --   --  0.13* 0.26*  CREATININE 0.52* 0.58* 0.52*  --   --     Assessment: 55yo male remains subtherapeutic on heparin after rate increase though now closer to goal; Hgb down a point but RN notes no signs of bleeding, Plt stable.  Goal of Therapy:  Heparin level 0.3-0.7 units/ml   Plan:  Will increase heparin by 2 units/kg/hr to 1400 units/hr and check level in 6hr.  Vernard GamblesVeronda Mayline Dragon, PharmD, BCPS  01/10/2016,3:55 AM

## 2016-01-10 NOTE — Progress Notes (Signed)
Family Medicine Teaching Service Daily Progress Note Intern Pager: [phone]  Patient name: [name] Medical record number: [id number] Date of birth: 11-23-1960 Age: 55 y.o. Gender: male  Primary Care Provider: Tarri AbernethyAbigail J Lancaster, [name] Consultants: wound care Code Status: FULL  Pt Overview and Major Events to Date:  Pt resting comfortably this morning and tolerating PO well. Pain continues with movement of L leg.   Assessment and Plan: [name] a 55 y.o.malepresenting with LLE pain, swelling and increased warmth despite outpatient antibiotic for cellulitis. PMH is significant for Cerebral Palsy, Quadriplegia, hx gastric ulcer, Bipolar DO  Cellulitis of Left Lower extremity with Pressure Ulcer on Left Medial Ankle: h/o recurrent cellulitis in this area with recently failing outpt tx with 5 days of PO Doxycycline. Afebrile, no leukocytosis, VSS. 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 S/p Vanc in ED. - IV Clindamycin 600mg  q8 (8/14-), consider 14d total course - consider PICC line for continued outpatient IV antibiotics vs continued hospitalization until course complete - Blood cultures (drawn after given IV Vanc in ED) NGTD - Tylenol/ Tramadol PRN pain  - start heparin for chronic L DVT - LLE doppler final read pending  Pressure Ulcer on Left Ankle and Dorsum of Foot:s/p skin grafts; per ALF, has follow up appointment on 8/17. ALF reports daily dressing changes.  - wound care consulted, recommended plastics eval - plastic surgery consulted, appreciate recs  Quadriplegia/Cerebral Palsy:  - continue home meds baclofen, fesoterodine, zanaflex prn - PT consulted, recommended continued PT at home after d/c  Acne Vulgaris:  - home hydrocortisone acetate cream daily every PM   PreDM:not on medications currently. Last A1c 5.7 on 08/2015 - no indication to  check CBG/SSI  Bipolar DO:  - Depakote 500mg  BID  Hx Of Gastric Ulcer:  - home prevacid   FEN/GI: heart healthy/carb mod; SLIV, protonix Prophylaxis: heparin  Disposition: return to [facility] when IV abx complete or with PICC line  Subjective:  [name] is resting comfortably this morning, joking about his appetite being good.  Objective: Temp:  [97.9 F (36.6 C)-99.4 F (37.4 C)] 98.7 F (37.1 C) (08/16 0855) Pulse Rate:  [81-92] 92 (08/16 0855) Resp:  [17-18] 18 (08/16 0410) BP: (105-122)/(63-68) 122/68 (08/16 0855) SpO2:  [96 %-99 %] 99 % (08/16 0855) Weight:  [69.7 kg (153 lb 11.2 oz)] 69.7 kg (153 lb 11.2 oz) (08/15 2038) Physical Exam: General: NAD, pleasant.  Cardiovascular: RRR, no M/r/g Respiratory: CTAB Abdomen: SNTND Extremities: R leg atophic and in offloading device. L leg TTP distal to thigh. Redness decreased below skin marker locations. Not warm. No appreciable pulse.   Laboratory:  Recent Labs Lab 01/08/16 1558 01/09/16 0643 01/10/16 0321  WBC 8.7 7.8 7.7  HGB 10.8* 10.5* 9.6*  HCT 34.8* 33.9* 30.6*  PLT 315 314 338    Recent Labs Lab 01/08/16 1056 01/08/16 1558 01/09/16 0643  NA 140  --  141  K 3.8  --  4.4  CL 106  --  104  CO2 24  --  25  BUN 9  --  13  CREATININE 0.52* 0.58* 0.52*  CALCIUM 9.3  --  9.0  GLUCOSE 109*  --  85    [name] 01/10/2016, 9:47 AM PGY-1, [facility] FPTS Intern pager: [phone], text pages welcome

## 2016-01-10 NOTE — Discharge Instructions (Signed)
Information on my medicine - XARELTO (rivaroxaban)  This medication education was reviewed with me or my healthcare representative as part of my discharge preparation.   WHY WAS XARELTO PRESCRIBED FOR YOU? Xarelto was prescribed to treat blood clots that may have been found in the veins of your legs (deep vein thrombosis) or in your lungs (pulmonary embolism) and to reduce the risk of them occurring again.  What do you need to know about Xarelto? The starting dose is one 15 mg tablet taken TWICE daily with food for the FIRST 21 DAYS then on 02/01/2016  the dose is changed to one 20 mg tablet taken ONCE A DAY with your evening meal.  DO NOT stop taking Xarelto without talking to the health care provider who prescribed the medication.  Refill your prescription for 20 mg tablets before you run out.  After discharge, you should have regular check-up appointments with your healthcare provider that is prescribing your Xarelto.  In the future your dose may need to be changed if your kidney function changes by a significant amount.  What do you do if you miss a dose? If you are taking Xarelto TWICE DAILY and you miss a dose, take it as soon as you remember. You may take two 15 mg tablets (total 30 mg) at the same time then resume your regularly scheduled 15 mg twice daily the next day.  If you are taking Xarelto ONCE DAILY and you miss a dose, take it as soon as you remember on the same day then continue your regularly scheduled once daily regimen the next day. Do not take two doses of Xarelto at the same time.   Important Safety Information Xarelto is a blood thinner medicine that can cause bleeding. You should call your healthcare provider right away if you experience any of the following: ? Bleeding from an injury or your nose that does not stop. ? Unusual colored urine (red or dark brown) or unusual colored stools (red or black). ? Unusual bruising for unknown reasons. ? A serious fall or  if you hit your head (even if there is no bleeding).  Some medicines may interact with Xarelto and might increase your risk of bleeding while on Xarelto. To help avoid this, consult your healthcare provider or pharmacist prior to using any new prescription or non-prescription medications, including herbals, vitamins, non-steroidal anti-inflammatory drugs (NSAIDs) and supplements.  This website has more information on Xarelto: [email or web address].xarelto.[email or web address].

## 2016-01-11 LAB — CBC
HEMATOCRIT: 33.2 % — AB (ref 39.0–52.0)
HEMOGLOBIN: 10.4 g/dL — AB (ref 13.0–17.0)
MCH: 25.1 pg — ABNORMAL LOW (ref 26.0–34.0)
MCHC: 31.3 g/dL (ref 30.0–36.0)
MCV: 80 fL (ref 78.0–100.0)
Platelets: 384 10*3/uL (ref 150–400)
RBC: 4.15 MIL/uL — AB (ref 4.22–5.81)
RDW: 16.3 % — ABNORMAL HIGH (ref 11.5–15.5)
WBC: 6.6 10*3/uL (ref 4.0–10.5)

## 2016-01-11 MED ORDER — ACETAMINOPHEN 500 MG PO TABS
500.0000 mg | ORAL_TABLET | Freq: Four times a day (QID) | ORAL | Status: DC | PRN
  Administered 2016-01-11: 500 mg via ORAL
  Filled 2016-01-11: qty 1

## 2016-01-11 MED ORDER — FENTANYL CITRATE (PF) 100 MCG/2ML IJ SOLN
50.0000 ug | Freq: Once | INTRAMUSCULAR | Status: DC
  Filled 2016-01-11: qty 2

## 2016-01-11 MED ORDER — OXYCODONE-ACETAMINOPHEN 5-325 MG PO TABS
1.0000 | ORAL_TABLET | Freq: Four times a day (QID) | ORAL | Status: DC | PRN
  Administered 2016-01-11 – 2016-01-18 (×10): 1 via ORAL
  Filled 2016-01-11 (×11): qty 1
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
## 2016-01-11 NOTE — Progress Notes (Signed)
Orthopedic Tech Progress Note Patient Details:  [name] [birth year] [id number]  Ortho Devices Type of Ortho Device: Post (long leg) splint Ortho Device/Splint Location: lle Ortho Device/Splint Interventions: Ordered, Application Assisted dr Otelia Sergeantnitka with splint application  [name], [name] 01/11/2016, 8:19 PM

## 2016-01-11 NOTE — Progress Notes (Signed)
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 Lajoyce Cornersuda for a left distal 1/3 tibial shaft fracture, oblique spiral with mild displacement. Injury 10/18/2015.  Admitted on 8/14 For left leg cellulitis, repeat xray shows a new more proximal left tibia fracture.  Exam: left leg with swelling knee to left foot mild compared with the right leg. Left foot is  Externally rotated. The left leg is warm and he feels pain with any movement of the left leg. There are dry areas of ulcers over the left medial malleolus and left dorsal great toe  Metatarsophalangeal area. No motor function of the left leg present due to baseline quadriparesis.Compartments are soft. No open wounds overlying the fracture site.  Xray left tibia-fibula fracture distal 1/3 oblique spiral with callus present and minimal displacement. There is a new middle-distal 1/3rd tibia fracture again oblique and spiral just proximal to the 21/6860month old distal tibia fracture. The fibula is intact.  Dx: Closed oblique spiral fracture left mid-distal 1/3rd tibia above nearly 753 month old distal 1/3 tibia fracture. The injury apparently traumatic, he states that this one occurred when going to day activities at the care center and his left leg hit a door.  Plan: I will apply a well padded posterior splinted that can have the lower ACE wrap removed to allow for occasional examination of the left medial leg and ankle. I will ask Dr. Lajoyce Cornersuda if he will assist in the care of this injury as he is at high risk of skin break down And possible need for intervention. Risk of loss of limb with either closed or open treatment is high due to risk of skin breakdown in use of a splint or cast or infection if a  IM nail or ORIF is attempted.Meds for pain, norco or percocet as needed for pain as his CP does impair his ability to sense pain.
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
## 2016-01-11 NOTE — Care Management Important Message (Signed)
Important Message  Patient Details  Name: [name] MRN: [id number] Date of Birth: 13-Feb-1961   Medicare Important Message Given:  Yes    Shamila Lerch Stefan ChurchBratton 01/11/2016, 1:13 PM

## 2016-01-11 NOTE — Progress Notes (Signed)
Got the order from Dr. Macon LargeKimberley to administer Fentanyl when the ortho doctors come to see the patient.

## 2016-01-11 NOTE — Progress Notes (Signed)
OT Cancellation Note  Patient Details Name: [name] MRN: [id number] DOB: [birth year]   Cancelled Treatment:    Reason Eval/Treat Not Completed: OT screened, no needs identified, will sign off. Pt requires total assist for ADL and mobility at his baseline. No acute OT needs.  Evern BioMayberry, Charline Hoskinson Lynn 01/11/2016, 1:33 PM  [phone]
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
## 2016-01-11 NOTE — Progress Notes (Signed)
Family Medicine Teaching Service Daily Progress Note Intern Pager: [phone]  Patient name: [name] Medical record number: [id number] Date of birth: 24-Dec-1960 Age: 55 y.o. Gender: male  Primary Care Provider: Tarri Abernethy, MD Consultants: ortho Code Status: FULL  Pt Overview and Major Events to Date:  Pt reports a possible injury to L ankle on his wheelchair since last fracture > XR tib/fib and ankle show new fracture. Will consult ortho today.  Assessment and Plan: [name] a 55 y.o.malepresenting with LLE pain, swelling and increased warmth despite outpatient antibiotic for cellulitis. PMH is significant for Cerebral Palsy, Quadriplegia, hx gastric ulcer, Bipolar DO  Cellulitis of Left Lower extremity with Pressure Ulcer on Left Medial Ankle: h/o recurrent cellulitis in this area with recently failing outpt tx with 5 days of PO Doxycycline. Afebrile,no leukocytosis, VSS. 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 S/p Vanc in ED. Improvement of cellulitis with IV clindamycin x 2 days, switched to PO.  - PO clindamycin 300mg  TID for a total of 14 days, end date 01/21/16 - Blood cultures (drawn after given IV Vanc in ED) no growth in 2 days - Tylenol/ Tramadol PRN pain  - start heparin for chronic L DVT - LLE doppler final read pending  New Spiral Fracture of L tibia shaft: [name] XR tib/fib showed new spiral fracture with mild communition, posterior displacement and angulation at the mid left tibia shaft. No ankle fracture seen.  - consulted ortho, appreciate recs - pain control as above, one dose IV fentanyl ordered for ortho's exam  Pressure Ulcer on Left Ankle and Dorsum of Foot:s/p skin grafts; per ALF, has follow up appointment on 8/17. ALF reports daily dressing changes.  -  wound care consulted, recommended plastics eval - plastic surgery unable to assess since he is not their patient - has appt with his wound care center [facility] - Dr. Leanord Hawking) on 01/12/16 at 11am  Quadriplegia/Cerebral Palsy:  - continue home meds baclofen, fesoterodine, zanaflex prn - PT consulted, recommended continued PT at home after d/c  Acne Vulgaris:  - home hydrocortisone acetate cream daily every PM   PreDM:not on medications currently. Last A1c 5.7 on 08/2015 - no indication to check CBG/SSI  Bipolar DO:  - Depakote 500mg  BID  Hx Of Gastric Ulcer:  - home prevacid   FEN/GI: NPO pending ortho eval; SLIV, protonix Prophylaxis: heparin  Disposition: continued pain management and evaluation of new fracture  Subjective:  [name] is resting comfortably this morning, was wondering why he can't eat.   Objective: Temp:  [98 F (36.7 C)-98.7 F (37.1 C)] 98 F (36.7 C) (08/17 0445) Pulse Rate:  [85-99] 85 (08/17 0445) Resp:  [16-20] 16 (08/17 0445) BP: (107-122)/(68-75) 109/75 (08/17 0445) SpO2:  [96 %-99 %] 97 % (08/17 0445) Weight:  [149 lb 11.2 oz (67.9 kg)] 149 lb 11.2 oz (67.9 kg) (08/16 2010) Physical Exam: General: NAD, pleasant, contractures in all extremities. Cardiovascular: RRR, no M/r/g Respiratory: CTAB Abdomen: SNTND Extremities: R leg atophic and in offloading device. L leg TTP distal to thigh. Redness decreased below skin marker locations. Not warm. No appreciable pulse.   Laboratory:  Recent Labs Lab 01/09/16 0643 01/10/16 0321 01/11/16 0532  WBC 7.8 7.7 6.6  HGB 10.5* 9.6* 10.4*  HCT 33.9* 30.6* 33.2*  PLT  314 338 384    Recent Labs Lab 01/08/16 1056 01/08/16 1558 01/09/16 0643  NA 140  --  141  K 3.8  --  4.4  CL 106  --  104  CO2 24  --  25  BUN 9  --  13  CREATININE 0.52* 0.58* 0.52*  CALCIUM 9.3  --  9.0  GLUCOSE 109*  --  85    Imaging/Diagnostic Tests: Dg Tibia/fibula Left  Result Date: 01/10/2016 CLINICAL  DATA:  55 year old male with left lower extremity pain and swelling. Initial encounter. EXAM: LEFT TIBIA AND FIBULA - 2 VIEW COMPARISON:  Left ankle series from today reported separately. Left tib-fib series 10/19/2015. FINDINGS: Healing distal left tibia shaft spiral fracture which occurred in May with periosteal reaction. However, there is a new mid tibia shaft comminuted spiral fracture with mild posterior displacement and angulation (arrows). The left fibula continues to appear intact. Underlying osteopenia. Increased soft tissue swelling about the left tib-fib. No subcutaneous gas. IMPRESSION: 1. NEW spiral fracture with mild comminution, posterior displacement and angulation at the mid left tibia shaft. This was not visible on the left ankle series from today. 2. Healing but incompletely united more distal left tibia spiral fracture which occurred in May, which is described on today ankle study. Electronically Signed   By: Odessa [name] M.D.   On: 01/10/2016 18:35   Dg Ankle 2 Views Left  Addendum Date: 01/10/2016   ADDENDUM REPORT: 01/10/2016 18:36 ADDENDUM: See also the left tib-fib series from today reported separately, which demonstrates a NEW left tib-fib spiral fracture since 11/13/2015. Electronically Signed   By: Odessa [name] M.D.   On: 01/10/2016 18:36   Result Date: 01/10/2016 CLINICAL DATA:  55 year old male with left lower extremity pain swelling and erythema. Recurrent cellulitis. Subsequent encounter. EXAM: LEFT ANKLE - 2 VIEW COMPARISON:  Outside left ankle series 56213 and earlier. FINDINGS: Chronic subtalar arthrodesis with stable cannulated screw. Visible foot remarkable for chronic pes planus. Spiral fracture of the distal left tibia shaft re- demonstrated with periosteal new bone formation, but no convincing solid bridging bone. Mild displacement appears stable. Underlying advanced osteopenia. The visualized distal left fibula remains intact. Increased soft tissue swelling about the left ankle.  No subcutaneous gas. IMPRESSION: 1. Continued incomplete union of the distal left tibia spiral fracture. 2. Diffuse soft tissue swelling. No subcutaneous gas or new osseous abnormality identified. Electronically Signed: By: Odessa [name] M.D. On: 01/10/2016 18:31    [name], MD 01/11/2016, 6:57 AM PGY-1, [place] [facility] FPTS Intern pager: [phone], text pages welcome

## 2016-01-12 LAB — PREALBUMIN: PREALBUMIN: 20.8 mg/dL (ref 18–38)

## 2016-01-12 LAB — CBC
HCT: 33.3 % — ABNORMAL LOW (ref 39.0–52.0)
Hemoglobin: 10.3 g/dL — ABNORMAL LOW (ref 13.0–17.0)
MCH: 24.9 pg — ABNORMAL LOW (ref 26.0–34.0)
MCHC: 30.9 g/dL (ref 30.0–36.0)
MCV: 80.4 fL (ref 78.0–100.0)
Platelets: 457 10*3/uL — ABNORMAL HIGH (ref 150–400)
RBC: 4.14 MIL/uL — ABNORMAL LOW (ref 4.22–5.81)
RDW: 16.4 % — AB (ref 11.5–15.5)
WBC: 10 10*3/uL (ref 4.0–10.5)

## 2016-01-12 LAB — CBC AND DIFFERENTIAL
HCT: 33 % — AB (ref 41–53)
Hemoglobin: 10.3 g/dL — AB (ref 13.5–17.5)
PLATELETS: 457 10*3/uL — AB (ref 150–399)
WBC: 10 10^3/mL

## 2016-01-12 MED ORDER — PROSIGHT PO TABS
1.0000 | ORAL_TABLET | Freq: Every day | ORAL | Status: DC
  Administered 2016-01-12 – 2016-01-18 (×7): 1 via ORAL
  Filled 2016-01-12 (×7): qty 1

## 2016-01-12 MED ORDER — VITAMIN C 500 MG PO TABS
250.0000 mg | ORAL_TABLET | Freq: Every day | ORAL | Status: DC
  Administered 2016-01-12 – 2016-01-18 (×7): 250 mg via ORAL
  Filled 2016-01-12 (×7): qty 1

## 2016-01-12 MED ORDER — ENSURE ENLIVE PO LIQD
237.0000 mL | Freq: Two times a day (BID) | ORAL | Status: DC
  Administered 2016-01-12 – 2016-01-18 (×10): 237 mL via ORAL

## 2016-01-12 NOTE — Progress Notes (Signed)
PT Cancellation/Discharge Note  Patient Details Name: [name] L Steenson MRN: [id number] DOB: 1960-08-10   Cancelled Treatment:    Reason Eval/Treat Not Completed: PT screened, no needs identified, will sign off.  Patient requires total assist for all mobility and ADL's.  No acute PT needs identified.  See PT progress note of 01/09/16.   Staff to use lift equipment to move patient OOB.  PT will sign off.   Vena AustriaDavis, Freman Lapage H 01/12/2016, 6:00 PM Durenda HurtSusan H. Renaldo Fiddleravis, PT, [facility] Pager [phone]
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
## 2016-01-12 NOTE — Progress Notes (Addendum)
Left Leg splint intact. Left foot with mild swelling. Leg is warm and sensate. He feels better with splint in place. Less pain left leg with splint. Dressing removed over lower 1/3 of left leg and skin examined. Unchanged areas of  Ulcer dorsal left great toe and medial left ankle. Dressing reapplied. Patient on his right side with left leg internally rotated unable to move to eval the medial Distal calf. Dx left closed oblique spiral mid-distal 1/3 tibia fracture, mild displacement. Plan: This splint will be adequate to treat the injury as any cast or brace that is more Tight fitting will cause pressure areas. He is alright to be transferred to a SNF be to non-weight bearing status with bedside commode or bed pan.Needs one person to steady broken leg and 2 others to help with transfer or a hoyer lift. Should be seen every 2 weeks to ensure no skin breakdown and to try and modify his Motorized chair to protect his LEs.

## 2016-01-12 NOTE — Progress Notes (Signed)
New left Tibia fracture. Dr. Lajoyce Cornersuda is out of town and I will see later today The left leg splint should be adequate, the lower 1/3 of the front of the splint dressing can be removed and replaced to allow examination of the skin ulcers over the medial left ankle. I do not think surgery is necessary but careful non weight bearing status with support of the left leg when transferring non weight bearing bed to stretcher. Probably should not use motorized wheelchair until the leg has formed callus at fracture site. The wheel chair be modified to prevent LE trauma when patient is seated in the vehicle.
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
## 2016-01-12 NOTE — Progress Notes (Signed)
Initial Nutrition Assessment  DOCUMENTATION CODES:   Not applicable  INTERVENTION:  Provide Ensure Enlive po BID, each supplement provides 350 kcal and 20 grams of protein.  Encourage adequate PO intake.   NUTRITION DIAGNOSIS:   Increased nutrient needs related to wound healing as evidenced by estimated needs.  GOAL:   Patient will meet greater than or equal to 90% of their needs  MONITOR:   PO intake, Supplement acceptance, Labs, Weight trends, Skin, I & O's  REASON FOR ASSESSMENT:   Consult Wound healing  ASSESSMENT:   55 y.o. male presenting with LLE pain, swelling and increased warmth despite outpatient antibiotic for cellulitis. PMH is significant for Cerebral Palsy, Quadriplegia, hx gastric ulcer, Bipolar DO Pt with L tibia fracture.   Meal completion has been 100%. Pt reports eating well currently and PTA with consumption of at least 3 meals a day with no other difficulties. Usual body weight unknown to patient. Weight stable per weight records. Pt is agreeable to Ensure to aid in wound healing. RD to order. Pt encouraged to eat her food at meals.   Nutrition focused physical exam deferred at this time as pt with quadriplegia.   Labs and medications reviewed. Vitamin C 250 mg daily.  Diet Order:  Diet heart healthy/carb modified Room service appropriate? Yes; Fluid consistency: Thin  Skin:  Wound (see comment) (wound on L ankle, DTI on L leg)  Last BM:  8/17  Height:   Ht Readings from Last 1 Encounters:  01/11/16 6' (1.829 m)    Weight:   Wt Readings from Last 1 Encounters:  01/11/16 161 lb (73 kg)    Ideal Body Weight:  72.9 kg (adjusted for quadriplegia)  BMI:  Body mass index is 21.84 kg/m.  Estimated Nutritional Needs:   Kcal:  1950-2150  Protein:  90-100 grams  Fluid:  1.9 - 2.1 L/day  EDUCATION NEEDS:   No education needs identified at this time  Roslyn SmilingStephanie Fallen Crisostomo, MS, RD, LDN Pager # [phone] After hours/ weekend pager #  [phone]

## 2016-01-13 LAB — CULTURE, BLOOD (ROUTINE X 2)
CULTURE: NO GROWTH
Culture: NO GROWTH

## 2016-01-13 MED ORDER — DOCUSATE SODIUM 100 MG PO CAPS
100.0000 mg | ORAL_CAPSULE | Freq: Two times a day (BID) | ORAL | Status: DC
  Administered 2016-01-13 – 2016-01-18 (×10): 100 mg via ORAL
  Filled 2016-01-13 (×10): qty 1
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
## 2016-01-13 NOTE — Progress Notes (Signed)
Fentanyl  Was cancelled wasted the other 50 mcg in sink [address]Korri Ask RN BSN/ Gareth MorganRebecca Greenawalt witnessed
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
## 2016-01-14 DIAGNOSIS — M25572 Pain in left ankle and joints of left foot: Secondary | ICD-10-CM

## 2016-01-14 DIAGNOSIS — Z515 Encounter for palliative care: Secondary | ICD-10-CM

## 2016-01-14 NOTE — Clinical Social Work Note (Addendum)
Clinical Social Worker continuing to follow patient and family for support and discharge planning needs.  CSW spoke with patient and patient legal guardian/sister ([name]) and they both agree to ST-SNF placement at discharge.  [facility] and would ideally like to return, however he is in need of a hoyer lift that is currently ordered on back order.  Patient will need SNF placement until the lift is in place and they are able to appropriately use, prior to patient return.  Patient family is from [place] and not clear on facility options in the area.  CSW provided guidance on which facilities would remain in the area of where patient is currently living.  Of those facilities, patient family expressed [facility] in [facility].  CSW to initiate SNF search in [place] area and follow up with patient sister, [name], to determine bed choice.  Patient will need Level 2 [place] prior to SNF placement due to MR and CP diagnosis.  Patient sister plans to communicate with group home regarding patient current plans and potential return soon.  CSW remains available for support and to facilitate patient discharge needs once medically stable.  Macario GoldsJesse Berk [place], [place] [phone]

## 2016-01-14 NOTE — NC FL2 (Signed)
[place] MEDICAID FL2 LEVEL OF CARE SCREENING TOOL     IDENTIFICATION  Patient Name: [name] Birthdate: 1960/07/16 Sex: male Admission Date (Current Location): 01/08/2016  [facility] and [place] Number:  Producer, television/film/video and Address:  [facility] [place]. [facility], [address], [place], [place] [address]      Provider Number: [id number]  Attending Physician Name and Address:  Tobey Grim, MD  Relative Name and Phone Number:       Current Level of Care: Hospital Recommended Level of Care: Skilled Nursing Facility Prior Approval Number:    Date Approved/Denied:   PASRR Number:    Discharge Plan: SNF    Current Diagnoses: Patient Active Problem List   Diagnosis Date Noted  . Palliative care encounter   . Left ankle pain   . Cellulitis 01/08/2016  . 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 Periorbital cellulitis of right eye 11/10/2015  . Cerebral palsy, quadriplegic (HCC)   . Fracture of distal end of tibia   . Cellulitis of leg, left   . Venous stasis ulcer of ankle (HCC)   . Tibial fracture 10/19/2015  . 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 Pre-diabetes 08/31/2015  . Foot ulcer, limited to breakdown of skin (HCC) 06/08/2015  . Osteomyelitis of toe of right foot (HCC) 12/14/2014  . Constipation 11/01/2014  . Seasonal allergies 11/01/2014  . Chronic osteomyelitis (HCC)   . Toe pain, right   . Abdominal distension   . Abdominal pain   . Colitis 10/15/2014  . Pancolitis (HCC) 10/15/2014  . Cough 10/14/2014  . Pressure ulcer of foot 03/14/2014  . Knee pain 11/23/2013  . Healthcare maintenance 06/24/2013  . 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 Onychomycosis 01/13/2013  . Neuropathy (HCC) 01/13/2013  . Pressure ulcer 01/08/2013  . 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 Medication monitoring encounter 07/10/2011  . Hyperlipidemia 07/02/2010  . RENAL GLYCOSURIA 02/26/2010  . URINARY INCONTINENCE 02/09/2010  . 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 ECZEMA 06/09/2008  . 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 Mental retardation 07/24/2006  . Infantile cerebral palsy (HCC) 07/24/2006  . GASTRIC ULCER ACUTE WITH HEMORRHAGE 07/24/2006    Orientation RESPIRATION BLADDER Height & Weight     Self, Time, Situation, Place  Normal External catheter Weight: 160 lb 4.8 oz (72.7 kg) Height:  6' (182.9 cm)  BEHAVIORAL SYMPTOMS/MOOD NEUROLOGICAL BOWEL NUTRITION STATUS      Incontinent Diet (Heart Healthy / Carb Modified)  AMBULATORY STATUS COMMUNICATION OF NEEDS Skin   Total Care Verbally Other (Comment) (Deep Tissue Injury to Left Ankle and Great Toe / Right Great Toe)                       Personal Care Assistance Level of Assistance  Bathing, Feeding, Dressing, Total care Bathing Assistance: Maximum assistance Feeding assistance: Maximum assistance Dressing Assistance: Maximum assistance Total Care Assistance: Maximum assistance   Functional Limitations Info  Sight, Hearing, Speech Sight Info: Adequate Hearing Info: Adequate Speech Info: Adequate    SPECIAL CARE FACTORS FREQUENCY  OT (By licensed OT)       OT Frequency: 2            Contractures Contractures Info: Present    Additional Factors Info  Code Status, Allergies, Psychotropic Code Status Info: DNR Allergies Info: Adhesive Tape Psychotropic Info: Depakote Sprinkle         Current Medications (01/14/2016):  This is the  current hospital active medication list Current Facility-Administered Medications  Medication Dose Route Frequency Provider Last Rate Last Dose  . acetaminophen (TYLENOL) tablet 500 mg  500 mg Oral Q6H PRN [name], MD   500 mg at 01/11/16 1724  . baclofen (LIORESAL) tablet 20 mg  20 mg Oral QID Palma Holter, MD   20 mg at 01/14/16 1400  . clindamycin (CLEOCIN) capsule 300 mg  300 mg Oral Q8H [name], MD   300 mg at 01/14/16 1359  . divalproex (DEPAKOTE SPRINKLE) capsule  500 mg  500 mg Oral Q12H Tobey Grim, MD   500 mg at 01/14/16 1004  . docusate sodium (COLACE) capsule 100 mg  100 mg Oral BID Tobey Grim, MD   100 mg at 01/14/16 1002  . feeding supplement (ENSURE ENLIVE) (ENSURE ENLIVE) liquid 237 mL  237 mL Oral BID BM Tobey Grim, MD   237 mL at 01/14/16 1401  . fesoterodine (TOVIAZ) tablet 4 mg  4 mg Oral QHS Palma Holter, MD   4 mg at 01/13/16 2137  . hydrocortisone cream 1 %   Topical q1800 Tobey Grim, MD      . loratadine (CLARITIN) tablet 10 mg  10 mg Oral Daily Palma Holter, MD   10 mg at 01/14/16 1003  . multivitamin (PROSIGHT) tablet 1 tablet  1 tablet Oral Daily Howard Pouch, MD   1 tablet at 01/14/16 1004  . oxyCODONE-acetaminophen (PERCOCET/ROXICET) 5-325 MG per tablet 1 tablet  1 tablet Oral Q6H PRN [name], MD   1 tablet at 01/14/16 0919  . pantoprazole (PROTONIX) EC tablet 40 mg  40 mg Oral Daily Tobey Grim, MD   40 mg at 01/14/16 1003  . polyethylene glycol (MIRALAX / GLYCOLAX) packet 17 g  17 g Oral Daily PRN Howard Pouch, MD   17 g at 01/11/16 1618  . risperiDONE (RISPERDAL) tablet 2 mg  2 mg Oral QHS Palma Holter, MD   2 mg at 01/13/16 2138  . Rivaroxaban (XARELTO) tablet 15 mg  15 mg Oral BID WC [name], MD   15 mg at 01/14/16 0810  . tiZANidine (ZANAFLEX) tablet 2 mg  2 mg Oral TID Palma Holter, MD   2 mg at 01/14/16 1003  . traMADol (ULTRAM) tablet 50 mg  50 mg Oral Q6H PRN Palma Holter, MD   50 mg at 01/13/16 2138  . vitamin C (ASCORBIC ACID) tablet 250 mg  250 mg Oral Daily Howard Pouch, MD   250 mg at 01/14/16 1003     Discharge Medications: Please see discharge summary for a list of discharge medications.  Relevant Imaging Results:  Relevant Lab Results:   Additional Information SSN [id number]   Macario Golds, [place] [phone]

## 2016-01-14 NOTE — Progress Notes (Signed)
Family Medicine Teaching Service Daily Progress Note Intern Pager: [phone]  Patient name: [name] Medical record number: [id number] Date of birth: 1961-02-26 Age: 55 y.o. Gender: male  Primary Care Provider: Tarri AbernethyAbigail J Lancaster, MD Consultants: ortho, palliative care Code Status: FULL  Pt Overview and Major Events to Date:  No acute events overnight.   Assessment and Plan: [name] Gesserry L Humbleis a 55 y.o.malepresenting with LLE pain, swelling and increased warmth despite outpatient antibiotic for cellulitis. PMH is significant for Cerebral Palsy, Quadriplegia, hx gastric ulcer, Bipolar DO  Cellulitis of Left Lower extremity with Pressure Ulcer on Left Medial Ankle: h/o recurrent cellulitis in this area with recently failing outpt tx with 5 days of PO Doxycycline. Afebrile,no leukocytosis, VSS. 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 S/p Vanc in ED. Improvement of cellulitis with IV clindamycin x 2 days, switched to PO.  - PO clindamycin 300mg  TID for a total of 14 days, end date 01/21/16 - Blood cultures (drawn after given IV Vanc in ED) no growth - Tylenol/ Tramadol PRN pain  - xarelto for chronic L DVT   New Spiral Fracture of L tibia shaft: [name] XR tib/fib showed new spiral fracture with mild communition, posterior displacement and angulation at the mid left tibia shaft. No ankle fracture seen.  - consulted ortho, appreciate recs - pain control as above, one dose IV fentanyl 50mcg ordered for ortho's exam  Pressure Ulcer on Left Ankle and Dorsum of Foot:s/p skin grafts; per ALF, has follow up appointment on 8/17. ALF reports daily dressing changes.  - wound care consulted, recommended plastics eval - plastic surgery unable to assess since he is not their patient - will need a new [facility] Leanord Hawkingobson to f/u grafts  Quadriplegia/Cerebral Palsy:  - continue home meds baclofen, fesoterodine, zanaflex prn - PT consulted, recommended continued PT at home after d/c  Acne Vulgaris:  - home hydrocortisone acetate cream daily every PM   PreDM:not on medications currently. Last A1c 5.7 on 08/2015 - no indication to check CBG/SSI  Bipolar DO:  - Depakote 500mg  BID  Hx Of Gastric Ulcer:  - home prevacid   FEN/GI: NPO pending ortho eval; SLIV, protonix Prophylaxis: heparin  Disposition: awaiting placement  Subjective:  Pt resting comfortably, enjoying his new air mattress.  Objective: Temp:  [97.8 F (36.6 C)-99 F (37.2 C)] 99 F (37.2 C) (08/19 2018) Pulse Rate:  [76-101] 101 (08/19 2018) Resp:  [17-20] 20 (08/19 2018) BP: (91-117)/(65-72) 91/72 (08/19 2018) SpO2:  [95 %-98 %] 95 % (08/19 2018) Physical Exam: Cardiovascular: RRR, no M/r/g Respiratory: CTAB Abdomen: SNTND Extremities: R leg atophic and in offloading device. L leg splinted to midthigh.  Laboratory:  Recent Labs Lab 01/10/16 0321 01/11/16 0532 01/12/16 0322  WBC 7.7 6.6 10.0  HGB 9.6* 10.4* 10.3*  HCT 30.6* 33.2* 33.3*  PLT 338 384 457*    Recent Labs Lab 01/08/16 1056 01/08/16 1558 01/09/16 0643  NA 140  --  141  K 3.8  --  4.4  CL 106  --  104  CO2 24  --  25  BUN 9  --  13  CREATININE 0.52* 0.58* 0.52*  CALCIUM 9.3  --  9.0  GLUCOSE 109*  --  85    [name] BignessKathryn Timberlake, MD 01/14/2016, 4:54 AM  PGY-1, [name] Intern pager: [phone], text pages welcome

## 2016-01-14 NOTE — Consult Note (Signed)
Consultation Note Date: 01/14/2016   Patient Name: [name]  DOB: [birth year]  MRN: [id number]  Age / Sex: 55 y.o., male  PCP: Marquette Saa, MD Referring Physician: Tobey Grim, MD  Reason for Consultation: Establishing goals of care  HPI/Patient Profile: 55 y.o. male  with past medical history of Cerebral palsy, cellulitis, bipolar disorder, history of gastric ulcer, quadriplegia, admitted on 01/08/2016 with new spiral fracture of left tibia shaft. Patient reports injuring himself while in his wheelchair 1 week ago when he was unable to navigate his wheelchair out the door. He is complaining of pain to his left leg. He reports that the pain medicine does help but doesn't last very long and when it wears off he is in pain. .   Clinical Assessment and Goals of Care: Patient is very pleasant, alert and oriented 3. He shows good insight into his illness and his chief concern is that he is not able to return to his current living environment. He is anxious about going to a nursing facility. He is verbalizing pain. He is accurately able to describe his pain, the medications that he is being given for pain and effectiveness. He also accurately discuss this with me his family specifically his sister [name] who is his healthcare POA.  Consult was written for goals of care specifically to clarify his CODE STATUS. I did speak to patient as well as his healthcare power of attorney. Patient does show good insight into what resuscitative measures are like and tells me he would not want CPR defibrillation and intubation. He uses the word yes "natural death". His sister is also in agreement with this. We did specifically talk about CPR and defibrillation as this was something he had been considering in the past but is no longer.  His sister [name]    SUMMARY OF RECOMMENDATIONS    DNR/DNI Discharge to skilled nursing facility. I hope patient is able to recuperate and return to his group home Code Status/Advance Care Planning:  DNR    Symptom Management:   Pain: Patient has been receiving minimal PRN doses of meds per chart review but is describing fairly consistent pain to his left leg. We'll continue with as needed approach. Encourage nursing to speak with patient and assess pain on a more frequent basis. We'll monitor need for scheduled dosing  Palliative Prophylaxis:   Aspiration, Bowel Regimen, Delirium Protocol, Frequent Pain Assessment, Oral Care and Turn Reposition  Additional Recommendations (Limitations, Scope, Preferences):  No Chemotherapy, No Radiation and No Tracheostomy  Psycho-social/Spiritual:   Desire for further Chaplaincy support:no  Prognosis:   > 12 months  Discharge Planning: Skilled Nursing Facility for rehab with Palliative care service follow-up      Primary Diagnoses: Present on Admission: . Cellulitis   I have reviewed the medical record, interviewed the patient and family, and examined the patient. The following aspects are pertinent.  Past Medical History:  Diagnosis Date  . Anxiety    takes Ativan daily as needed  .  Bipolar disorder (HCC)    takes Depakote daily  . CEREBRAL PALSY 07/24/2006  . Chronic bronchitis (HCC)    "gets it q yr"  . Constipation    takes Miralax daily as needed  . Depression    takes Risperdal nightly  . Environmental allergies    takes Zyrtec daily  . GASTRIC ULCER ACUTE WITH HEMORRHAGE 07/24/2006  . GERD (gastroesophageal reflux disease)    takes Pravacid daily  . History of blood transfusion    no abnormal reaction noted  . History of hiatal hernia   . History of shingles   . History of stomach ulcers ~ 1996  . Hypercholesterolemia   . Pneumonia "quite a few times"   in 2015 and in June 2016  . Recurrent UTI (urinary tract infection)   . URINARY INCONTINENCE 02/09/2010    takes Detrol daily-occasionally incontinence  . Weakness    numbness in extremities   Social History   Social History  . Marital status: Single    Spouse name: N/A  . Number of children: N/A  . Years of education: N/A   Social History Main Topics  . Smoking status: Never Smoker  . Smokeless tobacco: Never Used  . Alcohol use No  . Drug use: No  . Sexual activity: No   Other Topics Concern  . None   Social History Narrative  . None   Family History  Problem Relation Age of Onset  . Adopted: Yes   Scheduled Meds: . baclofen  20 mg Oral QID  . clindamycin  300 mg Oral Q8H  . divalproex  500 mg Oral Q12H  . docusate sodium  100 mg Oral BID  . feeding supplement (ENSURE ENLIVE)  237 mL Oral BID BM  . fesoterodine  4 mg Oral QHS  . hydrocortisone cream   Topical q1800  . loratadine  10 mg Oral Daily  . multivitamin  1 tablet Oral Daily  . pantoprazole  40 mg Oral Daily  . risperiDONE  2 mg Oral QHS  . rivaroxaban  15 mg Oral BID WC  . tiZANidine  2 mg Oral TID  . vitamin C  250 mg Oral Daily   Continuous Infusions:  PRN Meds:.acetaminophen, oxyCODONE-acetaminophen, polyethylene glycol, traMADol Medications Prior to Admission:  Prior to Admission medications   Medication Sig Start Date End Date Taking? Authorizing Provider  baclofen (LIORESAL) 20 MG tablet Take 1 tablet (20 mg total) by mouth 3 (three) times daily. Patient taking differently: Take 20 mg by mouth 4 (four) times daily.  08/08/15  Yes [name], MD  cetirizine (ZYRTEC) 10 MG tablet Take 1 tablet (10 mg total) by mouth daily. 11/01/14  Yes Stephanie Coup Street, MD  clotrimazole-betamethasone (LOTRISONE) cream Apply 1 application topically See admin instructions. Apply to dressing change   Yes Historical Provider, MD  divalproex (DEPAKOTE SPRINKLE) 125 MG capsule Take 500 mg by mouth 2 (two) times daily.    Yes Historical Provider, MD  doxycycline (VIBRA-TABS) 100 MG tablet Take 1 tablet (100 mg  total) by mouth 2 (two) times daily. 01/04/16 01/14/16 Yes Howard Pouch, MD  Elastic Bandages & Supports (JOBST ANTI-EM KNEE HIGH MED) MISC by Does not apply route. Apply once in the morning and remove at bedtime daily   Yes Historical Provider, MD  Foot Care Products (PREVALON HEEL PROTECTOR) MISC 1 Device by Does not apply route daily. 06/20/15  Yes Marquette Saa, MD  Hydrocortisone Acetate 1 % CREA Apply 1 application topically daily.  To face, every PM. 06/09/14  Yes Stephanie Coup Street, MD  ketoconazole (NIZORAL) 2 % shampoo USE AS DIRECTED TWICE WEEKLY Patient taking differently: Use as directed twice weekly 07/31/15  Yes Marquette Saa, MD  LORazepam (ATIVAN) 1 MG tablet Take 1 mg by mouth every 4 (four) hours as needed for anxiety (anxiety).    Yes Historical Provider, MD  PREVACID SOLUTAB 30 MG disintegrating tablet DISSOLVE 1 TABLET ON TONGUE EVERY DAY 10/02/15  Yes Marquette Saa, MD  risperiDONE (RISPERDAL) 2 MG tablet Take 2 mg by mouth at bedtime.   Yes Historical Provider, MD  silver sulfADIAZINE (SILVADENE) 1 % cream Apply 1 application topically daily. 07/12/15  Yes Marquette Saa, MD  tiZANidine (ZANAFLEX) 4 MG tablet Take 2 mg by mouth 3 (three) times daily.  07/23/15  Yes Historical Provider, MD  tolterodine (DETROL LA) 2 MG 24 hr capsule Take 1 capsule (2 mg total) by mouth daily. 04/08/13  Yes Stephanie Coup Street, MD  traMADol (ULTRAM) 50 MG tablet Take 1 tablet (50 mg total) by mouth every 6 (six) hours as needed for severe pain. 10/22/15  Yes Campbell Stall, MD  tretinoin (RETIN-A) 0.025 % gel Apply 1 applicator topically at bedtime. 04/09/13  Yes Stephanie Coup Street, MD  VOLTAREN 1 % GEL APPLY 2GRAMS TOPICALLY TO BACK FOUR TIMES DAILY FOR BACK PAIN Patient taking differently: Apply 2g topically to back four times a day for back pain 07/28/15  Yes [name], MD  polyethylene glycol powder (GLYCOLAX/MIRALAX) powder Take 17 g by mouth 2  (two) times daily as needed. Patient taking differently: Take 17 g by mouth 2 (two) times daily as needed (constipation).  11/01/14   Bobbye Morton, MD   Allergies  Allergen Reactions  . Adhesive [Tape] Rash   Review of Systems  Unable to perform ROS: Other    Physical Exam  Constitutional: He is oriented to person, place, and time.  Fragile, middle aged man  HENT:  Head: Normocephalic and atraumatic.  Pulmonary/Chest: Effort normal.  Musculoskeletal:  Left leg in cast.   Neurological: He is alert and oriented to person, place, and time.  CP LE contractures. WC dependant  Skin: Skin is warm and dry.  Psychiatric: His speech is normal and behavior is normal. Judgment and thought content normal. His mood appears anxious.    Vital Signs: BP 124/77 (BP Location: Right Arm)   Pulse 96   Temp 99 F (37.2 C) (Oral)   Resp 17   Ht 6' (1.829 m)   Wt 72.7 kg (160 lb 4.8 oz)   SpO2 99%   BMI 21.74 kg/m  Pain Assessment: 0-10   Pain Score: 2    SpO2: SpO2: 99 % O2 Device:SpO2: 99 % O2 Flow Rate: .   IO: Intake/output summary:  Intake/Output Summary (Last 24 hours) at 01/14/16 0948 Last data filed at 01/14/16 4782  Gross per 24 hour  Intake             1440 ml  Output             2515 ml  Net            -1075 ml    LBM: Last BM Date: 01/13/16 Baseline Weight: Weight: 68.1 kg (150 lb 1.6 oz) Most recent weight: Weight: 72.7 kg (160 lb 4.8 oz)     Palliative Assessment/Data:   Flowsheet Rows   Flowsheet Row Most Recent Value  Intake Tab  Referral Department  Hospitalist  Unit at Time of Referral  Med/Surg Unit  Palliative Care Primary Diagnosis  Trauma  Date Notified  01/13/16  Palliative Care Type  New Palliative care  Reason for referral  Clarify Goals of Care  Date of Admission  01/08/16  Date first seen by Palliative Care  01/14/16  # of days Palliative referral response time  1 Day(s)  # of days IP prior to Palliative referral  5  Clinical  Assessment  Palliative Performance Scale Score  40%  Pain Max last 24 hours  Not able to report  Pain Min Last 24 hours  Not able to report  Dyspnea Max Last 24 Hours  Not able to report  Dyspnea Min Last 24 hours  Not able to report  Nausea Max Last 24 Hours  Not able to report  Nausea Min Last 24 Hours  Not able to report  Anxiety Max Last 24 Hours  Not able to report  Anxiety Min Last 24 Hours  Not able to report  Other Max Last 24 Hours  Not able to report  Psychosocial & Spiritual Assessment  Palliative Care Outcomes  Patient/Family meeting held?  Yes  Who was at the meeting?  pt and sister  Palliative Care Outcomes  Clarified goals of care, Changed CPR status  Patient/Family wishes: Interventions discontinued/not started   Mechanical Ventilation, Vasopressors  Palliative Care follow-up planned  No      Time In: 0900 Time Out: 1010 Time Total: 70 min Greater than 50%  of this time was spent counseling and coordinating care related to the above assessment and plan.  Signed by: Irean Hong, NP   Please contact Palliative Medicine Team phone at [phone] for questions and concerns.  For individual provider: See Loretha Stapler

## 2016-01-15 DIAGNOSIS — M25572 Pain in left ankle and joints of left foot: Secondary | ICD-10-CM

## 2016-01-15 DIAGNOSIS — L03116 Cellulitis of left lower limb: Principal | ICD-10-CM

## 2016-01-15 LAB — CBC
HCT: 34 % — ABNORMAL LOW (ref 39.0–52.0)
Hemoglobin: 11.2 g/dL — ABNORMAL LOW (ref 13.0–17.0)
MCH: 25.9 pg — ABNORMAL LOW (ref 26.0–34.0)
MCHC: 32.9 g/dL (ref 30.0–36.0)
MCV: 78.5 fL (ref 78.0–100.0)
PLATELETS: 554 10*3/uL — AB (ref 150–400)
RBC: 4.33 MIL/uL (ref 4.22–5.81)
RDW: 16.8 % — AB (ref 11.5–15.5)
WBC: 8.1 10*3/uL (ref 4.0–10.5)

## 2016-01-15 LAB — CBC AND DIFFERENTIAL: WBC: 8.1 10^3/mL

## 2016-01-15 MED ORDER — RIVAROXABAN 20 MG PO TABS: 20.0000 mg | ORAL_TABLET | Freq: Every day | ORAL | Status: DC

## 2016-01-15 NOTE — Progress Notes (Signed)
Family Medicine Teaching Service Daily Progress Note Intern Pager: [phone]  Patient name: Carola Rhineerry L Beem Medical record number: [id number] Date of birth: 05-16-1961 Age: 55 y.o. Gender: male  Primary Care Provider: Tarri AbernethyAbigail J Lancaster, MD Consultants: ortho, palliative, SW Code Status: DNR  Pt Overview and Major Events to Date:  No acute events overnight.   Assessment and Plan: Virgel Gesserry L Humbleis a 55 y.o.malepresenting with LLE pain, swelling and increased warmth despite outpatient antibiotic for cellulitis. PMH is significant for Cerebral Palsy, Quadriplegia, hx gastric ulcer, Bipolar DO  Cellulitis of Left Lower extremity with Pressure Ulcer on Left Medial Ankle: h/o recurrent cellulitis in this area with recently failing outpt tx with 5 days of PO Doxycycline. Afebrile,no leukocytosis, VSS. 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 S/p Vanc in ED. Improvement of cellulitis with IV clindamycin x 2 days, switched to PO.  - PO clindamycin 300mg  TID for a total of 14 days, end date 01/21/16 - Blood cultures (drawn after given IV Vanc in ED) no growth - Tylenol/ Tramadol PRN pain -percocet 5/325 q6H for pain, has not used any - xareltofor chronic L DVT   New Spiral Fracture of L tibia shaft: [name] XR tib/fib showed new spiral fracture with mild communition, posterior displacement and angulation at the mid left tibia shaft. No ankle fracture seen.  - consulted ortho, appreciate recs - pain control as above  Pressure Ulcer on Left Ankle and Dorsum of Foot:s/p skin grafts; per ALF, has follow up appointment on 8/17. ALF reports daily dressing changes.  - wound care consulted, recommended plastics eval - plastic surgery unable to assess since he is not their patient - will need a new appt w W[facility]  [facility] Dr. Leanord Hawkingobson to f/u grafts  Quadriplegia/Cerebral Palsy:  - continue home meds baclofen, fesoterodine, zanaflex prn - PT consulted, recommended continued PT at home after d/c  Acne Vulgaris:  - home hydrocortisone acetate cream daily every PM   PreDM:not on medications currently. Last A1c 5.7 on 08/2015 - no indication to check CBG/SSI  Bipolar DO:  - Depakote 500mg  BID  Hx Of Gastric Ulcer:  - home prevacid   FEN/GI: NPO pending ortho eval; SLIV, protonix Prophylaxis: heparin  Disposition: awaiting placement  Subjective:  Pt resting comfortably, enjoying his breakfast. Would like to know when and where he will be discharged. Objective: Temp:  [98.5 F (36.9 C)-99.6 F (37.6 C)] 98.5 F (36.9 C) (08/21 0510) Pulse Rate:  [74-93] 80 (08/21 0510) Resp:  [17-18] 18 (08/21 0510) BP: (111-119)/(66-70) 119/66 (08/21 0510) SpO2:  [95 %-99 %] 98 % (08/21 0510) Weight:  [70.3 kg (155 lb)] 70.3 kg (155 lb) (08/20 2201) Physical Exam: Cardiovascular: RRR, no M/r/g Respiratory: CTAB Abdomen: SNTND Extremities: R leg atophic and in offloading device. L leg splinted to midthigh.  Laboratory:  Recent Labs Lab 01/10/16 0321 01/11/16 0532 01/12/16 0322  WBC 7.7 6.6 10.0  HGB 9.6* 10.4* 10.3*  HCT 30.6* 33.2* 33.3*  PLT 338 384 457*    Recent Labs Lab 01/08/16 1056 01/08/16 1558 01/09/16 0643  NA 140  --  141  K 3.8  --  4.4  CL 106  --  104  CO2 24  --  25  BUN 9  --  13  CREATININE 0.52* 0.58* 0.52*  CALCIUM 9.3  --  9.0  GLUCOSE 109*  --  85     Garth BignessKathryn Atlas Crossland, MD 01/15/2016, 9:47 AM PGY-1, [facility] Family [facility] FPTS Intern pager: [phone], text pages welcome

## 2016-01-15 NOTE — Care Management Important Message (Signed)
Important Message  Patient Details  Name: [name] MRN: [id number] Date of Birth: 22-Jan-1961   Medicare Important Message Given:  Yes    [name] 01/15/2016, 12:03 PM

## 2016-01-16 DIAGNOSIS — Z515 Encounter for palliative care: Secondary | ICD-10-CM

## 2016-01-16 MED ORDER — ACETAMINOPHEN 500 MG PO TABS
1000.0000 mg | ORAL_TABLET | Freq: Two times a day (BID) | ORAL | Status: DC
  Administered 2016-01-16 (×2): 1000 mg via ORAL
  Filled 2016-01-16 (×2): qty 2

## 2016-01-16 NOTE — Progress Notes (Signed)
Family Medicine Teaching Service Daily Progress Note Intern Pager: [phone]  Patient name: [name] L Viles Medical record number: [id number] Date of birth: [birth year] Age: 55 y.o. Gender: male  Primary Care Provider: Tarri AbernethyAbigail J Lancaster, MD Consultants: ortho, palliative Code Status: DNR  Pt Overview and Major Events to Date:  No acute events overnight.  Assessment and Plan: [name] Gesserry L Humbleis a 55 y.o.malepresenting with LLE pain, swelling and increased warmth despite outpatient antibiotic for cellulitis. PMH is significant for Cerebral Palsy, Quadriplegia, hx gastric ulcer, Bipolar DO  Cellulitis of Left Lower extremity with Pressure Ulcer on Left Medial Ankle: h/o recurrent cellulitis in this area with recently failing outpt tx with 5 days of PO Doxycycline. Afebrile,no leukocytosis, VSS. 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 S/p Vanc in ED. Improvement of cellulitis with IV clindamycin x 2 days, switched to PO.  - PO clindamycin 300mg  TID for a total of 14 days, end date 01/21/16 - Blood cultures (drawn after given IV Vanc in ED) no growth - Tylenol/ Tramadol PRN pain -percocet 5/325 q6H for pain, used twice in the past 24hrs - xareltofor chronic L DVT   New Spiral Fracture of L tibia shaft: [name] XR tib/fib showed new spiral fracture with mild communition, posterior displacement and angulation at the mid left tibia shaft. No ankle fracture seen.  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 ALF reports daily dressing changes.  - wound care consulted, recommended plastics eval - plastic surgery unable to assess since he is not their patient - will need  a new appt w [facility] Dr. Leanord Hawkingobson to f/u grafts  Quadriplegia/Cerebral Palsy:  - continue home meds baclofen, fesoterodine, zanaflex prn - PT consulted, recommended continued PT at home after d/c  Acne Vulgaris:  - home hydrocortisone acetate cream daily every PM   PreDM:not on medications currently. Last A1c 5.7 on 08/2015 - no indication to check CBG/SSI  Bipolar DO:  - Depakote 500mg  BID  Hx Of Gastric Ulcer:  - home prevacid   FEN/GI: NPO pending ortho eval; SLIV, protonix Prophylaxis: heparin  Disposition: awaiting placement, CSW mentioned placement will likely happen today  Subjective:  Pt resting comfortably today. In great spirits, and continues to be curious about when and to where he will be discharged.  Objective: Temp:  [98.4 F (36.9 C)-98.9 F (37.2 C)] 98.4 F (36.9 C) (08/22 0419) Pulse Rate:  [80-99] 80 (08/22 0419) Resp:  [18] 18 (08/22 0419) BP: (108-136)/(63-77) 108/64 (08/22 0419) SpO2:  [95 %-98 %] 98 % (08/22 0419) Weight:  [70.4 kg (155 lb 3.3 oz)] 70.4 kg (155 lb 3.3 oz) (08/21 2130) Physical Exam: Cardiovascular: RRR, no M/r/g Respiratory: CTAB Abdomen: SNTND Extremities: R leg atophic and in offloading device. L leg splinted to midthigh.  Laboratory:  Recent Labs Lab 01/11/16 0532 01/12/16 0322 01/15/16 1721  WBC 6.6 10.0 8.1  HGB 10.4* 10.3* 11.2*  HCT 33.2* 33.3* 34.0*  PLT 384 457* 554*    [name] BignessKathryn Timberlake, MD 01/16/2016, 7:04 AM PGY-1, [facility] FPTS Intern pager: [phone], text pages welcome

## 2016-01-16 NOTE — Clinical Social Work Note (Signed)
[facility] person regarding [facility]. Hospital visit will be made this afternoon to evaluate patient for PASRR number.  CSW will continue to follow and [facility] once [place].  Genelle BalVanessa Nisreen Guise, MSW, LCSW Licensed Clinical Social Worker Clinical Social Work Department [facility] [phone]

## 2016-01-16 NOTE — Progress Notes (Signed)
PMT RN Note: discussed case in rounds, saw pt. New order from Dr Phillips OdorGolding for scheduled Tylenol for pain management; due to cognitive concerns, it is felt pt is not reporting pain. Pt c/o pain during my visit; RN will bring his prn Percocet. Plan f/u tomorrow and prn.  Margret ChanceMelanie G. Hiba Garry, RN, BSN, [facility] 01/16/2016 1:11 PM Cell [phone] 8:00-4:00 Monday-Friday Office [phone]

## 2016-01-17 MED ORDER — OXYCODONE-ACETAMINOPHEN 5-325 MG PO TABS
1.0000 | ORAL_TABLET | Freq: Three times a day (TID) | ORAL | Status: DC
  Administered 2016-01-17 – 2016-01-18 (×4): 1 via ORAL
  Filled 2016-01-17 (×5): qty 1

## 2016-01-17 NOTE — Progress Notes (Signed)
Family Medicine Teaching Service Daily Progress Note Intern Pager: [phone]  Patient name: [name] Medical record number: [id number] Date of birth: [birth year] Age: 55 y.o. Gender: male  Primary Care Provider: Tarri AbernethyAbigail J Lancaster, MD Consultants: ortho, palliative Code Status: DNR  Pt Overview and Major Events to Date:  No acute events overnight.  Assessment and Plan: [name] Gesserry L Humbleis a 55 y.o.malepresenting with LLE pain, swelling and increased warmth despite outpatient antibiotic for cellulitis. PMH is significant for Cerebral Palsy, Quadriplegia, hx gastric ulcer, Bipolar DO  Cellulitis of Left Lower extremity with Pressure Ulcer on Left Medial Ankle: h/o recurrent cellulitis in this area with recently failing outpt tx with 5 days of PO Doxycycline. Afebrile,no leukocytosis, VSS. 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 S/p Vanc in ED. Improvement of cellulitis with IV clindamycin x 2 days, switched to PO.  - PO clindamycin 300mg  TID for a total of 14 days, end date 01/21/16 - Blood cultures (drawn after given IV Vanc in ED) no growth - Tylenol/ Tramadol PRN pain -percocet 5/325 q6H for pain, used once in the past 24hrs - xareltofor chronic L DVT   New Spiral Fracture of L tibia shaft: [name] XR tib/fib showed new spiral fracture with mild communition, posterior displacement and angulation at the mid left tibia shaft. No ankle fracture seen.  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 ALF reports daily dressing changes.  - wound care consulted, recommended plastics eval - plastic surgery unable to assess since he is not their patient - will need  a new appt w [facility] Dr. Leanord [name] to f/u grafts  Quadriplegia/Cerebral Palsy:  - continue home meds baclofen, fesoterodine, zanaflex prn - PT consulted, recommended continued PT at home after d/c  Acne Vulgaris:  - home hydrocortisone acetate cream daily every PM   PreDM:not on medications currently. Last A1c 5.7 on 08/2015 - no indication to check CBG/SSI  Bipolar DO:  - Depakote 500mg  BID  Hx Of Gastric Ulcer:  - home prevacid   FEN/GI: NPO pending ortho eval; SLIV, protonix Prophylaxis: heparin  Disposition: awaiting placement, CSW mentioned placement will likely happen today  Subjective:  Pt resting comfortably today. He would love to know when he can leave here and how long he will have to stay at the SNF.   Objective: Temp:  [97.6 F (36.4 C)-98.7 F (37.1 C)] 97.6 F (36.4 C) (08/23 0900) Pulse Rate:  [84-92] 84 (08/23 0900) Resp:  [16-18] 18 (08/23 0900) BP: (104-124)/(54-80) 104/54 (08/23 0900) SpO2:  [96 %-99 %] 99 % (08/23 0900) Physical Exam: Cardiovascular: RRR, no M/r/g Respiratory: CTAB Abdomen: SNTND Extremities: R leg atophic and in offloading device. L leg splinted to midthigh.  Laboratory:  Recent Labs Lab 01/11/16 0532 01/12/16 0322 01/15/16 1721  WBC 6.6 10.0 8.1  HGB 10.4* 10.3* 11.2*  HCT 33.2* 33.3* 34.0*  PLT 384 457* 554*    [name] BignessKathryn Seaira Byus, MD 01/17/2016, 3:14 PM PGY-1, [facility] FPTS Intern pager: [phone], text pages welcome

## 2016-01-17 NOTE — Clinical Social Work Note (Signed)
Patient is medically stable for discharge pending receipt of PASRR. CSW checked  MUST throughout the day and no PASRR given yet. Talked with MD and informed her of no PASRR number.  CSW will continue to check for PASRR on 8/224 and [facility] when [place] [facility] received.  Genelle BalVanessa Palak Tercero, MSW, LCSW Licensed Clinical Social Worker Clinical Social Work Department [facility] [phone]

## 2016-01-17 NOTE — Progress Notes (Signed)
Palliative Medicine RN Note: Pt in bed, awake. Assisted him with eating a brownie brought in by his brother; provided juice. He states pain is not better today and that it isn't being relieved fast enough. He describes pain to his LLL, consistent with location of injury. He was started on scheduled Tylenol yesterday; Dr Phillips OdorGolding stopped that and scheduled Percocet today with prn doses still available. Plan f/u by PMT daily and prn for symptoms.  Margret ChanceMelanie G. Emmajo Bennette, RN, BSN, [facility] 01/17/2016 9:43 AM Cell [phone] 8:00-4:00 Monday-Friday Office [phone]

## 2016-01-17 NOTE — Care Management Important Message (Signed)
Important Message  Patient Details  Name: [name] MRN: [id number] Date of Birth: 25-Feb-1961   Medicare Important Message Given:  Yes    Toshiro Hanken Stefan ChurchBratton 01/17/2016, 11:48 AM

## 2016-01-18 DIAGNOSIS — B029 Zoster without complications: Secondary | ICD-10-CM

## 2016-01-18 MED ORDER — VALACYCLOVIR HCL 500 MG PO TABS
1000.0000 mg | ORAL_TABLET | Freq: Three times a day (TID) | ORAL | Status: DC
  Administered 2016-01-18: 1000 mg via ORAL
  Filled 2016-01-18: qty 2

## 2016-01-18 MED ORDER — OXYCODONE-ACETAMINOPHEN 5-325 MG PO TABS
1.0000 | ORAL_TABLET | Freq: Once | ORAL | Status: AC
Start: 2016-01-18 — End: 2016-01-18
  Administered 2016-01-18: 1 via ORAL
  Filled 2016-01-18: qty 1

## 2016-01-18 MED ORDER — RIVAROXABAN 15 MG PO TABS: 15.0000 mg | ORAL_TABLET | Freq: Two times a day (BID) | ORAL | 0 refills | Status: DC

## 2016-01-18 MED ORDER — OXYCODONE-ACETAMINOPHEN 5-325 MG PO TABS: 1.0000 | ORAL_TABLET | Freq: Three times a day (TID) | ORAL | 0 refills | Status: DC | PRN

## 2016-01-18 MED ORDER — VALACYCLOVIR HCL 500 MG PO TABS
1000.0000 mg | ORAL_TABLET | Freq: Three times a day (TID) | ORAL | Status: DC
  Administered 2016-01-18 (×2): 1000 mg via ORAL
  Filled 2016-01-18: qty 2

## 2016-01-18 MED ORDER — CLINDAMYCIN HCL 300 MG PO CAPS: 300.0000 mg | ORAL_CAPSULE | Freq: Three times a day (TID) | ORAL | 0 refills | Status: DC

## 2016-01-18 MED ORDER — RIVAROXABAN 20 MG PO TABS: 20.0000 mg | ORAL_TABLET | Freq: Every day | ORAL | 0 refills | Status: DC

## 2016-01-18 MED ORDER — BACLOFEN 20 MG PO TABS: 20.0000 mg | ORAL_TABLET | Freq: Four times a day (QID) | ORAL | Status: DC

## 2016-01-18 MED ORDER — ASCORBIC ACID 250 MG PO TABS: 250.0000 mg | ORAL_TABLET | Freq: Every day | ORAL | 0 refills | Status: DC

## 2016-01-18 MED ORDER — VALACYCLOVIR HCL 1 G PO TABS: 1000.0000 mg | ORAL_TABLET | Freq: Three times a day (TID) | ORAL | 0 refills | Status: DC

## 2016-01-18 NOTE — Clinical Social Work Placement (Addendum)
CLINICAL SOCIAL WORK PLACEMENT  NOTE 01/18/16 - DISCHARGED TO [facility]  Date:  01/18/2016  Patient Details  Name: [name] MRN: [id number] Date of Birth: 1961/02/22  Clinical Social Work is seeking post-discharge placement for this patient at the Skilled  Nursing Facility level of care (*CSW will initial, date and re-position this form in  chart as items are completed):  No   Patient/family provided with [facility] Clinical Social Work Department's list of facilities offering this level of care within the geographic area requested by the patient (or if unable, by the patient's family).  Yes   Patient/family informed of their freedom to choose among providers that offer the needed level of care, that participate in Medicare, Medicaid or managed care program needed by the patient, have an available bed and are willing to accept the patient.  Yes   Patient/family informed of [facility]'s ownership interest in [facility] and [facility], as well as of the fact that they are under no obligation to receive care at these facilities.  PASRR submitted to EDS on 01/14/16     PASRR number received on 01/18/16     Existing PASRR number confirmed on       FL2 transmitted to all facilities in geographic area requested by pt/family on 01/14/16     FL2 transmitted to all facilities within larger geographic area on       Patient informed that his/her managed care company has contracts with or will negotiate with certain facilities, including the following:        Yes   Patient/family informed of bed offers received.  Patient chooses bed at [facility]     Physician recommends and patient chooses bed at      Patient to be transferred to [facility] on 01/18/16.  Patient to be transferred to facility by Ambulance     Patient family notified on 01/18/16 of transfer.  Name of family member notified:  Sister, Lynda Rainwater.  Attempted to reach sister on cell (VM full) and home (no answer). Ms. Garfield Cornea completed admissions paperwork at [facility] on 9/22.   PHYSICIAN       Additional Comment:    _______________________________________________ Cristobal Goldmann, LCSW 01/18/2016, 4:54 PM

## 2016-01-18 NOTE — Progress Notes (Signed)
NT notified this RN that patient had a rash to his left upper thigh. Upon assessment, rash appeared to be red, painful and contained fluid filled blisters. MD notified. MD determined rash to be shingles. Airborne and contact precautions initiated. Mask placed on patient and door closed until patient is able to be moved to the negative pressure room. Will continue to monitor the patient closely.   Feliciana RossettiLaura Ilina Xu, RN, BSN

## 2016-01-18 NOTE — Progress Notes (Signed)
Family Medicine Teaching Service Daily Progress Note Intern Pager: [phone]  Patient name: [name] Medical record number: [id number] Date of birth: May 18, 1961 Age: 55 y.o. Gender: male  Primary Care Provider: Tarri Abernethy, MD Consultants: ortho, palliative Code Status: DNR  Pt Overview and Major Events to Date:  Overnight, nursing paged with concern for L inner thigh rash. Concern for possible shingles, pt was placed on airborne precautions.  Assessment and Plan: [name] Lemmerman Humbleis a 55 y.o.malepresenting with LLE pain, swelling and increased warmth despite outpatient antibiotic for cellulitis. PMH is significant for Cerebral Palsy, Quadriplegia, hx gastric ulcer, Bipolar DO  Cellulitis of Left Lower extremity with Pressure Ulcer on Left Medial Ankle: h/o recurrent cellulitis in this area with recently failing outpt tx with 5 days of PO Doxycycline. Afebrile,no leukocytosis, VSS. 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 S/p Vanc in ED. Improvement of cellulitis with IV clindamycin x 2 days, switched to PO.  - PO clindamycin 300mg  TID for a total of 14 days, end date 01/21/16 - Blood cultures (drawn after given IV Vanc in ED) no growth - Tylenol/ Tramadol PRN pain -percocet 5/325 q6H for pain, used once in the past 24hrs - xareltofor chronic L DVT   New Spiral Fracture of L tibia shaft: [name] XR tib/fib showed new spiral fracture with mild communition, posterior displacement and angulation at the mid left tibia shaft. No ankle fracture seen.  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 ALF reports daily dressing changes.  - wound care consulted,  recommended plastics eval - plastic surgery unable to assess since he is not their patient - will need a new appt [facility] Leanord Hawking to f/u grafts  Quadriplegia/Cerebral Palsy:  - continue home meds baclofen, fesoterodine, zanaflex prn - PT consulted, recommended continued PT at home after d/c  L inner thigh rash: 4x4 cm rash, erythematous vesicles in 3 patches down the L inner thigh. Likely folliculitis. Nonpainful, pruritic, although crossing dermatomes, shingles still possible.  -starting valtrex 1000mg  TID x 7days  Acne Vulgaris:  - home hydrocortisone acetate cream daily every PM   PreDM:not on medications currently. Last A1c 5.7 on 08/2015 - no indication to check CBG/SSI  Bipolar DO:  - Depakote 500mg  BID  Hx Of Gastric Ulcer:  - home prevacid   FEN/GI: NPO pending ortho eval; SLIV, protonix Prophylaxis: heparin  Disposition: awaiting placement, CSW mentioned placement will likely happen today  Subjective:  Pt resting comfortably today. He would love to know when he can leave here and how long he will have to stay at the SNF.   Objective: Temp:  [97.6 F (36.4 C)-98.4 F (36.9 C)] 98 F (36.7 C) (08/24 0803) Pulse Rate:  [78-92] 78 (08/24 0803) Resp:  [17-18] 18 (08/24 0803) BP: (102-125)/(51-77) 102/51 (08/24 0803) SpO2:  [97 %-100 %] 97 % (08/24 0803) Physical Exam: Cardiovascular: RRR, no M/r/g Respiratory: CTAB Abdomen: SNTND Extremities: R leg atophic and in offloading device. L leg splinted to midthigh. L inner thigh rash - vesicles with erythematous base, pruritic.            Laboratory:  Recent Labs Lab 01/12/16  0322 01/15/16 1721  WBC 10.0 8.1  HGB 10.3* 11.2*  HCT 33.3* 34.0*  PLT 457* 554*    [name] BignessKathryn Timberlake, MD 01/18/2016, 9:23 AM PGY-1, [facility] Family Medicine FPTS Intern pager: [phone], text pages welcome

## 2016-01-18 NOTE — Care Management Important Message (Signed)
Important Message  Patient Details  Name: [name] MRN: [id number] Date of Birth: September 20, 1960   Medicare Important Message Given:  Yes    Alieyah Spader, Annamarie MajorCheryl U, RN 01/18/2016, 12:20 PM

## 2016-01-18 NOTE — Progress Notes (Signed)
Nutrition Follow-up  DOCUMENTATION CODES:   Not applicable  INTERVENTION:  Continue Ensure Enlive po BID, each supplement provides 350 kcal and 20 grams of protein.  Encourage adequate PO intake.   NUTRITION DIAGNOSIS:   Increased nutrient needs related to wound healing as evidenced by estimated needs; ongoing  GOAL:   Patient will meet greater than or equal to 90% of their needs; met  MONITOR:   PO intake, Supplement acceptance, Labs, Weight trends, Skin, I & O's  REASON FOR ASSESSMENT:   Consult Wound healing  ASSESSMENT:   55 y.o. male presenting with LLE pain, swelling and increased warmth despite outpatient antibiotic for cellulitis. PMH is significant for Cerebral Palsy, Quadriplegia, hx gastric ulcer, Bipolar DO. Pt with L inner thigh rash, concern for possible shingles.   Meal completion has been 75-100%. Appetite good. Pt currently has Ensure ordered and has been consuming them. RD to continue with current orders.  Diet Order:  Diet heart healthy/carb modified Room service appropriate? Yes; Fluid consistency: Thin  Skin:  Wound (see comment) (DTI on L leg, wound on L ankle, rash on L inner thigh)  Last BM:  8/23  Height:   Ht Readings from Last 1 Encounters:  01/11/16 6' (1.829 m)    Weight:   Wt Readings from Last 1 Encounters:  01/15/16 155 lb 3.3 oz (70.4 kg)    Ideal Body Weight:  72.9 kg (adjusted for quadriplegia)  BMI:  Body mass index is 21.05 kg/m.  Estimated Nutritional Needs:   Kcal:  1950-2150  Protein:  90-100 grams  Fluid:  1.9 - 2.1 L/day  EDUCATION NEEDS:   No education needs identified at this time  Corrin Parker, MS, RD, LDN Pager # [phone] After hours/ weekend pager # [phone]

## 2016-01-19 ENCOUNTER — Encounter: Payer: Self-pay | Admitting: Internal Medicine

## 2016-01-19 ENCOUNTER — Non-Acute Institutional Stay (SKILLED_NURSING_FACILITY): Payer: Medicare Other | Admitting: Internal Medicine

## 2016-01-19 DIAGNOSIS — I82512 Chronic embolism and thrombosis of left femoral vein: Secondary | ICD-10-CM | POA: Diagnosis not present

## 2016-01-19 DIAGNOSIS — I83023 Varicose veins of left lower extremity with ulcer of ankle: Secondary | ICD-10-CM

## 2016-01-19 DIAGNOSIS — L03116 Cellulitis of left lower limb: Secondary | ICD-10-CM | POA: Diagnosis not present

## 2016-01-19 DIAGNOSIS — R7303 Prediabetes: Secondary | ICD-10-CM | POA: Diagnosis not present

## 2016-01-19 DIAGNOSIS — Z8719 Personal history of other diseases of the digestive system: Secondary | ICD-10-CM

## 2016-01-19 DIAGNOSIS — R32 Unspecified urinary incontinence: Secondary | ICD-10-CM

## 2016-01-19 DIAGNOSIS — L89523 Pressure ulcer of left ankle, stage 3: Secondary | ICD-10-CM

## 2016-01-19 DIAGNOSIS — Z8711 Personal history of peptic ulcer disease: Secondary | ICD-10-CM

## 2016-01-19 DIAGNOSIS — L97329 Non-pressure chronic ulcer of left ankle with unspecified severity: Secondary | ICD-10-CM

## 2016-01-19 DIAGNOSIS — G808 Other cerebral palsy: Secondary | ICD-10-CM

## 2016-01-19 DIAGNOSIS — L708 Other acne: Secondary | ICD-10-CM | POA: Diagnosis not present

## 2016-01-19 DIAGNOSIS — F79 Unspecified intellectual disabilities: Secondary | ICD-10-CM

## 2016-01-19 DIAGNOSIS — S82243A Displaced spiral fracture of shaft of unspecified tibia, initial encounter for closed fracture: Secondary | ICD-10-CM

## 2016-01-19 DIAGNOSIS — L97521 Non-pressure chronic ulcer of other part of left foot limited to breakdown of skin: Secondary | ICD-10-CM

## 2016-01-19 DIAGNOSIS — B029 Zoster without complications: Secondary | ICD-10-CM

## 2016-01-19 NOTE — Progress Notes (Signed)
MRN: [id number] Name: [name]  Sex: male Age: 55 y.o. DOB: 1960/12/04  PSC #:  [facility] Level Of Care: SNF Provider: Randon GoldsmithAnne D. Lyn HollingsheadAlexander, MD Emergency Contacts: Extended Emergency Contact Information Primary Emergency Contact: [facility] HospitalFulcher,Dianne Address: [address]          [place], [place] [id number] [place] Home Phone: [phone] Mobile Phone: [phone] Relation: Sister Secondary Emergency Contact: Marlin CanaryGuarino,Debbie  [place] Mobile Phone: [phone] Relation: Sister  Code Status: DNR  Allergies: Adhesive [tape]  Chief Complaint  Patient presents with  . New Admit To SNF    Admit to Facility    HPI: Patient is 55 y.o. male with Cerebral Palsy, Quadriplegia, hx gastric ulcer, Bipolar DO who [facility] for LLE cellulitis. He has a hx of cellulitis in that area and had had increased redness ,swelling and pain since 8/9. He was also noted to be slower with ADL's and agitated and confused. Pt was treated with IV clinda. When swelling didn't improve studies showed a DVT in LLE and pt was started on heparin, then xarelto..When pain still persisted an xray showed a spiral fx of the L tibia. Ortho rec splint and admit to SNF because it will be take 3 people to move pt for any reason. While at SNF pt will be followed for bipolar dx, tx with risperdal and depakote, acne, tx with retin-A gel and urinary incontinence, tx with detrol LA.  Past Medical History:  Diagnosis Date  . Anxiety    takes Ativan daily as needed  . Bipolar disorder (HCC)    takes Depakote daily  . CEREBRAL PALSY 07/24/2006  . Chronic bronchitis (HCC)    "gets it q yr"  . Constipation    takes Miralax daily as needed  . Depression    takes Risperdal nightly  . Environmental allergies    takes Zyrtec daily  . GASTRIC ULCER ACUTE WITH HEMORRHAGE 07/24/2006  . GERD (gastroesophageal reflux disease)    takes Pravacid daily  .  History of blood transfusion    no abnormal reaction noted  . History of hiatal hernia   . History of shingles   . History of stomach ulcers ~ 1996  . Hypercholesterolemia   . Pneumonia "quite a few times"   in 2015 and in June 2016  . Recurrent UTI (urinary tract infection)   . URINARY INCONTINENCE 02/09/2010   takes Detrol daily-occasionally incontinence  . Weakness    numbness in extremities    Past Surgical History:  Procedure Laterality Date  . AMPUTATION Right 12/14/2014   Procedure: AMPUTATION DIGIT RIGHT GREAT TOE MTP;  Surgeon: Nadara MustardMarcus Duda V, MD;  Location: [facility] OR;  Service: Orthopedics;  Laterality: Right;  . head surgery  as a baby   due to "water head"   . HERNIA REPAIR    . HIATAL HERNIA REPAIR  1982  . LEG SURGERY Bilateral ~ 1967   "legs were scissored; stretched them out"      Medication List       Accurate as of 01/19/16 11:59 PM. Always use your most recent med list.          ascorbic acid 250 MG tablet Commonly known as:  VITAMIN C Take 1 tablet (250 mg total) by mouth daily.   baclofen 20 MG tablet Commonly known as:  LIORESAL Take 1 tablet (20 mg total) by mouth 4 (four) times daily.   cetirizine 10  MG tablet Commonly known as:  ZYRTEC Take 1 tablet (10 mg total) by mouth daily.   clindamycin 300 MG capsule Commonly known as:  CLEOCIN Take 1 capsule (300 mg total) by mouth every 8 (eight) hours.   clotrimazole-betamethasone cream Commonly known as:  LOTRISONE Apply 1 application topically See admin instructions. Apply to dressing change   divalproex 125 MG capsule Commonly known as:  DEPAKOTE SPRINKLE Take 500 mg by mouth 2 (two) times daily. 2 capsules every morning and 2 capsules at bedtime   Hydrocortisone Acetate 1 % Crea Apply 1 application topically daily. To face, every PM.   JOBST ANTI-EM KNEE HIGH MED Misc by Does not apply route. Apply once in the morning and remove at bedtime daily   ketoconazole 2 % shampoo Commonly known  as:  NIZORAL USE AS DIRECTED TWICE WEEKLY   oxyCODONE-acetaminophen 5-325 MG tablet Commonly known as:  PERCOCET/ROXICET Take 1 tablet by mouth every 8 (eight) hours as needed for moderate pain.   polyethylene glycol powder powder Commonly known as:  GLYCOLAX/MIRALAX Take 17 g by mouth 2 (two) times daily as needed.   PREVACID SOLUTAB 30 MG disintegrating tablet Generic drug:  lansoprazole DISSOLVE 1 TABLET ON TONGUE EVERY DAY   PREVALON HEEL PROTECTOR Misc 1 Device by Does not apply route daily.   risperiDONE 2 MG tablet Commonly known as:  RISPERDAL Take 2 mg by mouth at bedtime.   Rivaroxaban 15 MG Tabs tablet Commonly known as:  XARELTO Take 1 tablet (15 mg total) by mouth 2 (two) times daily with a meal.   rivaroxaban 20 MG Tabs tablet Commonly known as:  XARELTO Take 1 tablet (20 mg total) by mouth daily with supper. Start taking on:  02/01/2016   silver sulfADIAZINE 1 % cream Commonly known as:  SILVADENE Apply 1 application topically daily.   tiZANidine 4 MG tablet Commonly known as:  ZANAFLEX Take 2 mg by mouth 3 (three) times daily.   tolterodine 2 MG 24 hr capsule Commonly known as:  DETROL LA Take 1 capsule (2 mg total) by mouth daily.   traMADol 50 MG tablet Commonly known as:  ULTRAM Take 1 tablet (50 mg total) by mouth every 6 (six) hours as needed for severe pain.   tretinoin 0.025 % gel Commonly known as:  RETIN-A Apply 1 applicator topically at bedtime.   valACYclovir 1000 MG tablet Commonly known as:  VALTREX Take 1 tablet (1,000 mg total) by mouth 3 (three) times daily.   VOLTAREN 1 % Gel Generic drug:  diclofenac sodium APPLY 2GRAMS TOPICALLY TO BACK FOUR TIMES DAILY FOR BACK PAIN       No orders of the defined types were placed in this encounter.   Immunization History  Administered Date(s) Administered  . Influenza Whole 03/14/2009, 02/16/2010  . Influenza, Seasonal, Injecte, Preservative Fre 07/13/2012  . Influenza,inj,Quad  PF,36+ Mos 06/24/2013, 03/14/2014  . PPD Test 07/13/2012  . Pneumococcal Conjugate-13 11/02/2013  . Pneumococcal Polysaccharide-23 07/25/2001  . Td 05/28/1999, 12/25/2009    Social History  Substance Use Topics  . Smoking status: Never Smoker  . Smokeless tobacco: Never Used  . Alcohol use No    Family history is UTO, pt was adopted.   Family History  Problem Relation Age of Onset  . Adopted: Yes      Review of Systems  DATA OBTAINED: from nurse GENERAL:  no fevers, fatigue, appetite changes SKIN: No itching, rash or wounds EYES: No eye pain, redness, discharge EARS: No earache,  tinnitus, change in hearing NOSE: No congestion, drainage or bleeding  MOUTH/THROAT: No mouth or tooth pain, No sore throat RESPIRATORY: No cough, wheezing, SOB CARDIAC: No chest pain, palpitations, lower extremity edema  GI: No abdominal pain, No N/V/D or constipation, No heartburn or reflux  GU: No dysuria, frequency or urgency, or incontinence  MUSCULOSKELETAL: No unrelieved bone/joint pain NEUROLOGIC: No headache, dizziness or focal weakness PSYCHIATRIC: No c/o anxiety or sadness   Vitals:   01/19/16 0919  BP: 106/61  Pulse: 84  Resp: 18  Temp: 97.7 F (36.5 C)    SpO2 Readings from Last 1 Encounters:  01/19/16 96%        Physical Exam  GENERAL APPEARANCE: Alert, conversant,  No acute distress.  SKIN: No diaphoresis ; reported rash HEAD: Normocephalic, atraumatic  EYES: Conjunctiva/lids clear. Pupils round, reactive. EOMs intact.  EARS: External exam WNL, canals clear. Hearing grossly normal.  NOSE: No deformity or discharge.  MOUTH/THROAT: Lips w/o lesions  RESPIRATORY: Breathing is even, unlabored. Lung sounds are clear   CARDIOVASCULAR: Heart RRR no murmurs, rubs or gallops. No peripheral edema.   GASTROINTESTINAL: Abdomen is soft, non-tender, not distended w/ normal bowel sounds. GENITOURINARY: Bladder non tender, not distended  MUSCULOSKELETAL: No abnormal joints or  musculature NEUROLOGIC:  Cranial nerves 2-12 grossly intact; quadriplegia PSYCHIATRIC: pleasant, developmentally delayed, no behavioral issues  Patient Active Problem List   Diagnosis Date Noted  . Shingles   . Palliative care encounter   . Left ankle pain   . Cellulitis 01/08/2016  . 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 Periorbital cellulitis of right eye 11/10/2015  . Cerebral palsy, quadriplegic (HCC)   . Fracture of distal end of tibia   . Cellulitis of left lower extremity   . Venous stasis ulcer of ankle (HCC)   . Tibial fracture 10/19/2015  . 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 Pre-diabetes 08/31/2015  . Foot ulcer, limited to breakdown of skin (HCC) 06/08/2015  . Osteomyelitis of toe of right foot (HCC) 12/14/2014  . Constipation 11/01/2014  . Seasonal allergies 11/01/2014  . Chronic osteomyelitis (HCC)   . Toe pain, right   . Abdominal distension   . Abdominal pain   . Colitis 10/15/2014  . Pancolitis (HCC) 10/15/2014  . Cough 10/14/2014  . Pressure ulcer of foot 03/14/2014  . Knee pain 11/23/2013  . Healthcare maintenance 06/24/2013  . 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 Onychomycosis 01/13/2013  . Neuropathy (HCC) 01/13/2013  . Pressure ulcer 01/08/2013  . 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 Medication monitoring encounter 07/10/2011  . Hyperlipidemia 07/02/2010  . RENAL GLYCOSURIA 02/26/2010  . URINARY INCONTINENCE 02/09/2010  . 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 ECZEMA 06/09/2008  . 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 Mental retardation 07/24/2006  . Infantile cerebral palsy (HCC) 07/24/2006  . GASTRIC ULCER ACUTE WITH HEMORRHAGE 07/24/2006       Component Value Date/Time   WBC 8.1 01/15/2016 1721   RBC 4.33 01/15/2016 1721   HGB 11.2 (L) 01/15/2016 1721   HCT 34.0 (L) 01/15/2016 1721   PLT 554 (H) 01/15/2016 1721   MCV 78.5 01/15/2016 1721   MCV 90.0 10/03/2012 1505   LYMPHSABS 2.3 01/08/2016 1056    MONOABS 0.7 01/08/2016 1056   EOSABS 0.3 01/08/2016 1056   BASOSABS 0.0 01/08/2016 1056        Component Value Date/Time   NA 141 01/09/2016 0643   NA 140 01/08/2016   K 4.4 01/09/2016 4098  CL 104 01/09/2016 0643   CO2 25 01/09/2016 0643   GLUCOSE 85 01/09/2016 0643   BUN 13 01/09/2016 0643   BUN 9 01/08/2016   CREATININE 0.52 (L) 01/09/2016 0643   CREATININE 0.55 07/07/2014 1144   CALCIUM 9.0 01/09/2016 0643   PROT 6.3 (L) 10/19/2015 1102   ALBUMIN 2.8 (L) 10/19/2015 1102   AST 14 (L) 10/19/2015 1102   ALT 19 10/19/2015 1102   ALKPHOS 171 (H) 10/19/2015 1102   BILITOT 0.6 10/19/2015 1102   GFRNONAA >60 01/09/2016 0643   GFRAA >60 01/09/2016 0643    Lab Results  Component Value Date   HGBA1C 5.7 08/31/2015    Lab Results  Component Value Date   CHOL 182 07/07/2014   HDL 45 07/07/2014   LDLCALC 104 (H) 07/07/2014   LDLDIRECT 118 (H) 07/13/2012   TRIG 164 (H) 07/07/2014   CHOLHDL 4.0 07/07/2014     No results found.  Not all labs, radiology exams or other studies done during hospitalization come through on my EPIC note; however they are reviewed by me.    Assessment and Plan  LLE CELLULITIS/PRESURE ULCER ON L MEDIAL ANKLE-pt failed 5 days of outpt tx  With doxy; tx with IV clinda for 2 days then po clinda 300 mg TID for total 14 days, end date 01/21/16 SNf - po clinda 300 m TID for 14 days, end date 8/27  CHRONIC LLE DVT-  Common femoral, femoral, popliteal, most proximal calf-tx with heparin, then switched to xarelto SNF - cont xarelto 15 mg BID until 9/7 then 20 mg daily afterwards  NEW SPIRAL FRACTURE OF L TIBIAL SHAFT-displaced, comminuted and angulated; per ortho splinted and will f.u as outpt SNF - pt will have to be moved with help of 3 people, 1 to hold  Leg and 2 to lift pt;   PRESSURE ULCER L ANKLE AND DORSUM OF FOOT- s/p skin grafts, [per pt's ALF he was getting daily dressing changes;need appt with DrRobson for  f/u at wound  center  QUADRIPLEGIA /CEREBRAL PALSY/MR SNF - cont baclofen and xanaflex  URINARY INCONTINENCE SNF - cont detrol LA  RASH ON THIGH - folliculits vs shingles; pt was started on valtrex SNF - cont valtrex 100 mg TID for 7 days  [facility] snf - HYDROCORTISONE CREAM NIGHTLY AND retinA gel nightly  PRE-DIABETES- A1C IN 08/2015 WAS 5.7, NO NEED TO CHECK  BS  Hx gastric ulcer SNF - prophylaxed with prevacid 30 mg daily   Time spent > 45 min;> 50% of time with patient was spent reviewing records, labs, tests and studies, counseling and developing plan of care  Thurston Hole D. Lyn Hollingshead, MD

## 2016-01-20 ENCOUNTER — Encounter: Payer: Self-pay | Admitting: Internal Medicine

## 2016-01-20 DIAGNOSIS — Z8719 Personal history of other diseases of the digestive system: Secondary | ICD-10-CM | POA: Insufficient documentation

## 2016-01-20 DIAGNOSIS — I82519 Chronic embolism and thrombosis of unspecified femoral vein: Secondary | ICD-10-CM | POA: Insufficient documentation

## 2016-01-20 DIAGNOSIS — Z8711 Personal history of peptic ulcer disease: Secondary | ICD-10-CM | POA: Insufficient documentation

## 2016-01-26 ENCOUNTER — Ambulatory Visit: Payer: Medicare Other | Admitting: Physical Medicine & Rehabilitation

## 2016-02-16 ENCOUNTER — Encounter: Payer: Self-pay | Admitting: Internal Medicine

## 2016-02-16 ENCOUNTER — Non-Acute Institutional Stay (SKILLED_NURSING_FACILITY): Payer: Medicare Other | Admitting: Internal Medicine

## 2016-02-16 DIAGNOSIS — S82243A Displaced spiral fracture of shaft of unspecified tibia, initial encounter for closed fracture: Secondary | ICD-10-CM

## 2016-02-16 DIAGNOSIS — G808 Other cerebral palsy: Secondary | ICD-10-CM | POA: Diagnosis not present

## 2016-02-16 DIAGNOSIS — L03116 Cellulitis of left lower limb: Secondary | ICD-10-CM

## 2016-02-16 DIAGNOSIS — Z8719 Personal history of other diseases of the digestive system: Secondary | ICD-10-CM

## 2016-02-16 DIAGNOSIS — R32 Unspecified urinary incontinence: Secondary | ICD-10-CM

## 2016-02-16 DIAGNOSIS — Z8711 Personal history of peptic ulcer disease: Secondary | ICD-10-CM

## 2016-02-16 DIAGNOSIS — I82512 Chronic embolism and thrombosis of left femoral vein: Secondary | ICD-10-CM

## 2016-02-16 DIAGNOSIS — L708 Other acne: Secondary | ICD-10-CM | POA: Diagnosis not present

## 2016-02-16 NOTE — Progress Notes (Signed)
MRN: [id number] Name: [name]  Sex: male Age: 55 y.o. DOB: 04-Aug-1960  PSC #:  Facility/Room: [facility] / [facility] [address] Level Of Care: SNF Provider: Randon Goldsmith. Lyn Hollingshead, MD Emergency Contacts: Extended Emergency Contact Information Primary Emergency Contact: [facility] Address: [address]          [place], [place] [address] [place] of [place] Home Phone: [phone] Mobile Phone: [phone] Relation: Sister Secondary Emergency Contact: Marlin [place] Mobile Phone: [phone] Relation: Sister  Code Status:DNR  Allergies: Adhesive [tape]  Chief Complaint  Patient presents with  . Discharge Note    Discharged from SNF    HPI: Patient is 55 y.o. male with Cerebral Palsy, Quadriplegia, hx gastric ulcer, Bipolar DO who was admitted to [facility] from 8/14-24 for LLE cellulitis. He has a hx of cellulitis in that area and had had increased redness ,swelling and pain since 8/9. He was also noted to be slower with ADL's and agitated and confused. Pt was treated with IV clinda. When swelling didn't improve studies showed a DVT in LLE and pt was started on heparin, then xarelto..When pain still persisted an xray showed a spiral fx of the L tibia. Ortho rec splint and admit to SNF because it will be take 3 people to move pt for any reason. Pt was admitted to SNF for OT/PT and is now ready to be d/c to home.  Past Medical History:  Diagnosis Date  . Anxiety    takes Ativan daily as needed  . Bipolar disorder (HCC)    takes Depakote daily  . CEREBRAL PALSY 07/24/2006  . Chronic bronchitis (HCC)    "gets it q yr"  . Constipation    takes Miralax daily as needed  . Depression    takes Risperdal nightly  . Environmental allergies    takes Zyrtec daily  . GASTRIC ULCER ACUTE WITH HEMORRHAGE 07/24/2006  . GERD (gastroesophageal reflux disease)    takes Pravacid daily  . History of blood transfusion    no abnormal reaction noted  . History of hiatal hernia    . History of shingles   . History of stomach ulcers ~ 1996  . Hypercholesterolemia   . Pneumonia "quite a few times"   in 2015 and in June 2016  . Recurrent UTI (urinary tract infection)   . URINARY INCONTINENCE 02/09/2010   takes Detrol daily-occasionally incontinence  . Weakness    numbness in extremities    Past Surgical History:  Procedure Laterality Date  . AMPUTATION Right 12/14/2014   Procedure: AMPUTATION DIGIT RIGHT GREAT TOE MTP;  Surgeon: Nadara Mustard, MD;  Location: [facility] OR;  Service: Orthopedics;  Laterality: Right;  . head surgery  as a baby   due to "water head"   . HERNIA REPAIR    . HIATAL HERNIA REPAIR  1982  . LEG SURGERY Bilateral ~ 1967   "legs were scissored; stretched them out"      Medication List       Accurate as of 02/16/16 12:20 PM. Always use your most recent med list.          ascorbic acid 250 MG tablet Commonly known as:  VITAMIN C Take 1 tablet (250 mg total) by mouth daily.   baclofen 20 MG tablet Commonly known as:  LIORESAL Take 1 tablet (20 mg total) by mouth 4 (four) times daily.   cetirizine 10 MG tablet Commonly known as:  ZYRTEC Take 1 tablet (10 mg total) by  mouth daily.   divalproex 125 MG capsule Commonly known as:  DEPAKOTE SPRINKLE Take 500 mg by mouth 2 (two) times daily. 2 capsules every morning and 2 capsules at bedtime   feeding supplement (PRO-STAT SUGAR FREE 64) Liqd Take 30 mLs by mouth 2 (two) times daily.   Hydrocortisone Acetate 1 % Crea Apply 1 application topically daily. To face, every PM.   JOBST ANTI-EM KNEE HIGH MED Misc by Does not apply route. Apply once in the morning and remove at bedtime daily   ketoconazole 2 % shampoo Commonly known as:  NIZORAL USE AS DIRECTED TWICE WEEKLY   oxyCODONE-acetaminophen 5-325 MG tablet Commonly known as:  PERCOCET/ROXICET Take 1 tablet by mouth every 8 (eight) hours as needed for moderate pain.   polyethylene glycol powder powder Commonly known as:   GLYCOLAX/MIRALAX Take 17 g by mouth 2 (two) times daily as needed.   PREVALON HEEL PROTECTOR Misc 1 Device by Does not apply route daily.   risperiDONE 2 MG tablet Commonly known as:  RISPERDAL Take 2 mg by mouth at bedtime.   Rivaroxaban 15 MG Tabs tablet Commonly known as:  XARELTO Take 1 tablet (15 mg total) by mouth 2 (two) times daily with a meal.   rivaroxaban 20 MG Tabs tablet Commonly known as:  XARELTO Take 1 tablet (20 mg total) by mouth daily with supper.   silver sulfADIAZINE 1 % cream Commonly known as:  SILVADENE Apply 1 application topically daily.   tiZANidine 4 MG tablet Commonly known as:  ZANAFLEX Take 2 mg by mouth 3 (three) times daily.   tolterodine 2 MG 24 hr capsule Commonly known as:  DETROL LA Take 1 capsule (2 mg total) by mouth daily.   traMADol 50 MG tablet Commonly known as:  ULTRAM Take 1 tablet (50 mg total) by mouth every 6 (six) hours as needed for severe pain.   tretinoin 0.025 % gel Commonly known as:  RETIN-A Apply 1 application topically at bedtime. Apply to face   VOLTAREN 1 % Gel Generic drug:  diclofenac sodium APPLY 2GRAMS TOPICALLY TO BACK FOUR TIMES DAILY FOR BACK PAIN       Meds ordered this encounter  Medications  . Amino Acids-Protein Hydrolys (FEEDING SUPPLEMENT, PRO-STAT SUGAR FREE 64,) LIQD    Sig: Take 30 mLs by mouth 2 (two) times daily.  Marland Kitchen. tretinoin (RETIN-A) 0.025 % gel    Sig: Apply 1 application topically at bedtime. Apply to face    Immunization History  Administered Date(s) Administered  . Influenza Whole 03/14/2009, 02/16/2010  . Influenza, Seasonal, Injecte, Preservative Fre 07/13/2012  . Influenza,inj,Quad PF,36+ Mos 06/24/2013, 03/14/2014  . PPD Test 07/13/2012, 01/18/2016  . Pneumococcal Conjugate-13 11/02/2013  . Pneumococcal Polysaccharide-23 07/25/2001  . Td 05/28/1999, 12/25/2009    Social History  Substance Use Topics  . Smoking status: Never Smoker  . Smokeless tobacco: Never Used   . Alcohol use No    Vitals:   02/16/16 1159  BP: 90/60  Pulse: 65  Resp: 18  Temp: 98.4 F (36.9 C)    Physical Exam  GENERAL APPEARANCE: Alert, conversant. No acute distress.  HEENT: Unremarkable. RESPIRATORY: Breathing is even, unlabored. Lung sounds are clear   CARDIOVASCULAR: Heart RRR no murmurs, rubs or gallops. No peripheral edema.  GASTROINTESTINAL: Abdomen is soft, non-tender, not distended w/ normal bowel sounds.  NEUROLOGIC: Cranial nerves 2-12 grossly intact; quadriplegia  Patient Active Problem List   Diagnosis Date Noted  . Chronic deep vein thrombosis (DVT) of femoral  vein (HCC) 01/20/2016  . Personal history of gastric ulcer 01/20/2016  . Shingles   . Palliative care encounter   . Left ankle pain   . Cellulitis 01/08/2016  . 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 Periorbital cellulitis of right eye 11/10/2015  . Cerebral palsy, quadriplegic (HCC)   . Fracture of distal end of tibia   . Cellulitis of left lower extremity   . Venous stasis ulcer of ankle (HCC)   . Spiral fracture of shaft of tibia 10/19/2015  . 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 Pre-diabetes 08/31/2015  . Foot ulcer, limited to breakdown of skin (HCC) 06/08/2015  . Osteomyelitis of toe of right foot (HCC) 12/14/2014  . Constipation 11/01/2014  . Seasonal allergies 11/01/2014  . Chronic osteomyelitis (HCC)   . Toe pain, right   . Abdominal distension   . Abdominal pain   . Colitis 10/15/2014  . Pancolitis (HCC) 10/15/2014  . Cough 10/14/2014  . Pressure ulcer of foot 03/14/2014  . Knee pain 11/23/2013  . Healthcare maintenance 06/24/2013  . 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 Onychomycosis 01/13/2013  . Neuropathy (HCC) 01/13/2013  . Pressure ulcer 01/08/2013  . 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 Medication monitoring encounter 07/10/2011  . Hyperlipidemia 07/02/2010  . RENAL GLYCOSURIA 02/26/2010  . URINARY INCONTINENCE 02/09/2010  . 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 ECZEMA 06/09/2008  . 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 Mental retardation 07/24/2006  . Infantile cerebral palsy (HCC) 07/24/2006  . GASTRIC ULCER ACUTE WITH HEMORRHAGE 07/24/2006    CBC    Component Value Date/Time   WBC 8.1 01/15/2016 1721   RBC 4.33 01/15/2016 1721   HGB 11.2 (L) 01/15/2016 1721   HCT 34.0 (L) 01/15/2016 1721   PLT 554 (H) 01/15/2016 1721   MCV 78.5 01/15/2016 1721   MCV 90.0 10/03/2012 1505   LYMPHSABS 2.3 01/08/2016 1056   MONOABS 0.7 01/08/2016 1056   EOSABS 0.3 01/08/2016 1056   BASOSABS 0.0 01/08/2016 1056    CMP     Component Value Date/Time   NA 141 01/09/2016 0643   NA 140 01/08/2016   K 4.4 01/09/2016 0643   CL 104 01/09/2016 0643   CO2 25 01/09/2016 0643   GLUCOSE 85 01/09/2016 0643   BUN 13 01/09/2016 0643   BUN 9 01/08/2016   CREATININE 0.52 (L) 01/09/2016 0643   CREATININE 0.55 07/07/2014 1144   CALCIUM 9.0 01/09/2016 0643   PROT 6.3 (L) 10/19/2015 1102   ALBUMIN 2.8 (L) 10/19/2015 1102   AST 14 (L) 10/19/2015 1102   ALT 19 10/19/2015 1102   ALKPHOS 171 (H) 10/19/2015 1102   BILITOT 0.6 10/19/2015 1102   GFRNONAA >60 01/09/2016 0643   GFRAA >60 01/09/2016 0643    Assessment and Plan  Pt is d/c to home with HH/OT/PT/Nursing. mdeications have been reconciled and Rxs written. DME are hospital bed and mechanical lift.   Time spent > 30 min;> 50% of time with patient was spent reviewing records, labs, tests and studies, counseling and developing plan of care  Randon Goldsmith. Lyn Hollingshead, MD

## 2016-02-19 ENCOUNTER — Encounter (HOSPITAL_BASED_OUTPATIENT_CLINIC_OR_DEPARTMENT_OTHER): Payer: Medicare Other

## 2016-02-20 ENCOUNTER — Telehealth: Payer: Self-pay | Admitting: *Deleted

## 2016-02-20 NOTE — Telephone Encounter (Signed)
Patient was transferred from hospital to [facility].  He is now with his group home and they noticed he is now on Xarelto and would like to when he needs to follow up with his PCP.  Also do they need to get labs drawn on him since he is on this medication.  [facility] that [facility] was released back to his group home with no instructions.  Please contact her so proper care can be continued.  Shadrack Brummitt,CMA

## 2016-02-26 ENCOUNTER — Other Ambulatory Visit: Payer: Self-pay | Admitting: Internal Medicine

## 2016-02-26 DIAGNOSIS — Z0000 Encounter for general adult medical examination without abnormal findings: Secondary | ICD-10-CM

## 2016-02-26 DIAGNOSIS — Z7901 Long term (current) use of anticoagulants: Secondary | ICD-10-CM

## 2016-02-27 ENCOUNTER — Encounter (HOSPITAL_BASED_OUTPATIENT_CLINIC_OR_DEPARTMENT_OTHER): Payer: Medicare Other | Attending: Internal Medicine

## 2016-02-27 DIAGNOSIS — L97521 Non-pressure chronic ulcer of other part of left foot limited to breakdown of skin: Secondary | ICD-10-CM | POA: Insufficient documentation

## 2016-02-27 DIAGNOSIS — L97221 Non-pressure chronic ulcer of left calf limited to breakdown of skin: Secondary | ICD-10-CM | POA: Diagnosis not present

## 2016-02-27 DIAGNOSIS — L97322 Non-pressure chronic ulcer of left 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: Secondary | ICD-10-CM | POA: Diagnosis not present

## 2016-02-27 DIAGNOSIS — G800 Spastic quadriplegic cerebral palsy: Secondary | ICD-10-CM | POA: Insufficient documentation

## 2016-02-27 DIAGNOSIS — I87332 Chronic venous hypertension (idiopathic) with ulcer and inflammation of left lower extremity: Secondary | ICD-10-CM | POA: Diagnosis present

## 2016-02-28 ENCOUNTER — Telehealth: Payer: Self-pay | Admitting: Family Medicine

## 2016-02-28 ENCOUNTER — Ambulatory Visit (INDEPENDENT_AMBULATORY_CARE_PROVIDER_SITE_OTHER): Payer: Medicare Other | Admitting: Family Medicine

## 2016-02-28 ENCOUNTER — Encounter: Payer: Self-pay | Admitting: Family Medicine

## 2016-02-28 DIAGNOSIS — I82512 Chronic embolism and thrombosis of left femoral vein: Secondary | ICD-10-CM

## 2016-02-28 DIAGNOSIS — Z23 Encounter for immunization: Secondary | ICD-10-CM | POA: Diagnosis present

## 2016-02-28 NOTE — Telephone Encounter (Signed)
**  After Hours/ Emergency Line Call*  Received call regarding [name] is having difficulty passing stool.  Home health RN asking for verbal order to manually disimpact patient.  Verbal order given.  Recommended that patient be seen in clinic if further difficulty passing stool, if hematochezia/ melena/ nausea/ vomiting.  Eduardo Wurth M. Nadine CountsGottschalk, DO PGY-3, [facility]

## 2016-02-28 NOTE — Progress Notes (Signed)
Subjective:   Patient ID: [name]    DOB: December 24, 1960, 55 y.o. male   MRN: [id number]  CC: Treatment of chronic DVT of left lower extremity  HPI: [name] Rhineerry L Sica is a 55 y.o. male who presents to clinic today concerning tx of chronic LLE DVT on Xarelto. Problems discussed today are as follows:  Chronic LLE DVT: diagnosed during hospital stay. Was initially tx with heparin and switched to Xarelto upon d/c. Patient has not missed doses. Tolerating well. Denies leg edema, pain or redness, SOB, CP. Patient accompanied by aid from facility who thought patient needed blood work while on Xarelto. No new concerns.  ROS: See HPI for pertinent ROS.  PMFSH: Pertinent past medical, surgical, family, and social history were reviewed and updated as appropriate. Smoking status reviewed.  Medications reviewed. Current Outpatient Prescriptions  Medication Sig Dispense Refill  . Amino Acids-Protein Hydrolys (FEEDING SUPPLEMENT, PRO-STAT SUGAR FREE 64,) LIQD Take 30 mLs by mouth 2 (two) times daily.    . baclofen (LIORESAL) 20 MG tablet Take 1 tablet (20 mg total) by mouth 4 (four) times daily.    . cetirizine (ZYRTEC) 10 MG tablet Take 1 tablet (10 mg total) by mouth daily. 30 tablet 3  . divalproex (DEPAKOTE SPRINKLE) 125 MG capsule Take 500 mg by mouth 2 (two) times daily. 2 capsules every morning and 2 capsules at bedtime    . Elastic Bandages & Supports (JOBST ANTI-EM KNEE HIGH MED) MISC by Does not apply route. Apply once in the morning and remove at bedtime daily    . Foot Care Products (PREVALON HEEL PROTECTOR) MISC 1 Device by Does not apply route daily. 2 each 0  . Hydrocortisone Acetate 1 % CREA Apply 1 application topically daily. To face, every PM. 30 g 2  . ketoconazole (NIZORAL) 2 % shampoo USE AS DIRECTED TWICE WEEKLY 120 mL 4  . oxyCODONE-acetaminophen (PERCOCET/ROXICET) 5-325 MG tablet Take 1 tablet by mouth every 8 (eight) hours as needed for moderate pain. 24 tablet 0  .  polyethylene glycol powder (GLYCOLAX/MIRALAX) powder Take 17 g by mouth 2 (two) times daily as needed. 3350 g 1  . risperiDONE (RISPERDAL) 2 MG tablet Take 2 mg by mouth at bedtime.     . Rivaroxaban (XARELTO) 15 MG TABS tablet Take 1 tablet (15 mg total) by mouth 2 (two) times daily with a meal. 26 tablet 0  . rivaroxaban (XARELTO) 20 MG TABS tablet Take 1 tablet (20 mg total) by mouth daily with supper. 71 tablet 0  . silver sulfADIAZINE (SILVADENE) 1 % cream Apply 1 application topically daily. (Patient not taking: Reported on 02/16/2016) 50 g 2  . tiZANidine (ZANAFLEX) 4 MG tablet Take 2 mg by mouth 3 (three) times daily.     Marland Kitchen. tolterodine (DETROL LA) 2 MG 24 hr capsule Take 1 capsule (2 mg total) by mouth daily. 90 capsule 3  . traMADol (ULTRAM) 50 MG tablet Take 1 tablet (50 mg total) by mouth every 6 (six) hours as needed for severe pain. (Patient not taking: Reported on 02/16/2016) 30 tablet 0  . tretinoin (RETIN-A) 0.025 % gel Apply 1 application topically at bedtime. Apply to face    . vitamin C (VITAMIN C) 250 MG tablet Take 1 tablet (250 mg total) by mouth daily. 30 tablet 0  . VOLTAREN 1 % GEL APPLY 2GRAMS TOPICALLY TO BACK FOUR TIMES DAILY FOR BACK PAIN 100 g 4   No current facility-administered medications for this visit.  Objective:   BP (!) 106/59 (BP Location: Left Arm, Patient Position: Sitting, Cuff Size: Normal)   Pulse 67   Temp 98.1 F (36.7 C) (Oral)  Vitals and nursing note reviewed.  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, in no acute distress with non-toxic appearance HEENT: normocephalic, atraumatic, moist mucous membranes Neck: supple, non-tender without lymphadenopathy CV: regular rate and rhythm without murmurs, rubs, or gallops Lungs: clear to auscultation bilaterally with normal work of breathing Abdomen: soft, non-tender, non-distended, no masses or organomegaly palpable, normoactive bowel sounds Skin: warm, dry, no rashes or  lesions, cap refill < 2 seconds Extremities: warm and well perfused, normal tone, left lower extremity with dry dressings, no edema or erythema appreciated  Assessment & Plan:   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 Hospitalized 8/14-8/24 for LLE cellulitis found to have new spiral tibial fx, and a chronic DVT, so he was transitioned from heparin to Xarelto upon d/c. There was no doppler study found in the history though it was documented in d/c summary? Will continue tx with Xarelto for presumed LLE chronic DVT. - Xarelto 20 mg QD for tx of DVT through 04/12/16 - Health aid informed patient does not need routine blood work while on Xarelto - Informed risk of anticoagulants including bleeding risks - Weekly wound care for leg and HH wound care at facility - F/u in 3 months  No orders of the defined types were placed in this encounter.  No orders of the defined types were placed in this encounter.   Durward Parcel, DO [facility], PGY-1 02/28/2016 10:44 AM

## 2016-02-28 NOTE — Assessment & Plan Note (Addendum)
Patient is a quadriplegic. Hospitalized 8/14-8/24 for LLE cellulitis found to have new spiral tibial fx, and a chronic DVT, so he was transitioned from heparin to Xarelto upon d/c. There was no doppler study found in the history though it was documented in d/c summary? Will continue tx with Xarelto for presumed LLE chronic DVT. - Xarelto 20 mg QD for tx of DVT through 04/12/16 - Health aid informed patient does not need routine blood work while on Xarelto - Informed risk of anticoagulants including bleeding risks - Weekly wound care for leg and HH wound care at facility - F/u in 3 months

## 2016-02-28 NOTE — Patient Instructions (Signed)
It was a pleasure to meet you today. Please see below to review our plan for today's visit.  1. Your left leg cellulitis seems to be improving. Continue with wound care. 2. You are on Xarelto 20 mg daily for you DVT in the left leg. Please take this medication until April 12, 2016. You do not need routine blood work while on this medication. I have attached information about this medication. 3. You have received you annual flu shot and had your ears irrigated. 4. Please follow up with clinic in 3 months or sooner if needed.  Please call the clinic at [phone] if your symptoms worsen or you have any concerns. It was my pleasure to see you. -- Durward Parcel, DO [facility], PGY-1  Rivaroxaban oral tablets What is this medicine? RIVAROXABAN (ri va ROX a ban) is an anticoagulant (blood thinner). It is used to treat blood clots in the lungs or in the veins. It is also used after knee or hip surgeries to prevent blood clots. It is also used to lower the chance of stroke in people with a medical condition called atrial fibrillation. This medicine may be used for other purposes; ask your health care provider or pharmacist if you have questions. What should I tell my health care provider before I take this medicine? They need to know if you have any of these conditions: -bleeding disorders -bleeding in the brain -blood in your stools (black or tarry stools) or if you have blood in your vomit -history of stomach bleeding -kidney disease -liver disease -low blood counts, like low white cell, platelet, or red cell counts -recent or planned spinal or epidural procedure -take medicines that treat or prevent blood clots -an unusual or allergic reaction to rivaroxaban, other medicines, foods, dyes, or preservatives -pregnant or trying to get pregnant -breast-feeding How should I use this medicine? Take this medicine by mouth with a glass of water. Follow the directions on the  prescription label. Take your medicine at regular intervals. Do not take it more often than directed. Do not stop taking except on your doctor's advice. Stopping this medicine may increase your risk of a blood clot. Be sure to refill your prescription before you run out of medicine. If you are taking this medicine after hip or knee replacement surgery, take it with or without food. If you are taking this medicine for atrial fibrillation, take it with your evening meal. If you are taking this medicine to treat blood clots, take it with food at the same time each day. If you are unable to swallow your tablet, you may crush the tablet and mix it in applesauce. Then, immediately eat the applesauce. You should eat more food right after you eat the applesauce containing the crushed tablet. Talk to your pediatrician regarding the use of this medicine in children. Special care may be needed. Overdosage: If you think you have taken too much of this medicine contact a poison control center or emergency room at once. NOTE: This medicine is only for you. Do not share this medicine with others. What if I miss a dose? If you take your medicine once a day and miss a dose, take the missed dose as soon as you remember. If you take your medicine twice a day and miss a dose, take the missed dose immediately. In this instance, 2 tablets may be taken at the same time. The next day you should take 1 tablet twice a day as directed.  What may interact with this medicine? -aspirin and aspirin-like medicines -certain antibiotics like erythromycin, azithromycin, and clarithromycin -certain medicines for fungal infections like ketoconazole and itraconazole -certain medicines for irregular heart beat like amiodarone, quinidine, dronedarone -certain medicines for seizures like carbamazepine, phenytoin -certain medicines that treat or prevent blood clots like warfarin, enoxaparin, and  dalteparin -conivaptan -diltiazem -felodipine -indinavir -lopinavir; ritonavir -NSAIDS, medicines for pain and inflammation, like ibuprofen or naproxen -ranolazine -rifampin -ritonavir -St. John's wort -verapamil This list may not describe all possible interactions. Give your health care provider a list of all the medicines, herbs, non-prescription drugs, or dietary supplements you use. Also tell them if you smoke, drink alcohol, or use illegal drugs. Some items may interact with your medicine. What should I watch for while using this medicine? Visit your doctor or health care professional for regular checks on your progress. Your condition will be monitored carefully while you are receiving this medicine. Notify your doctor or health care professional and seek emergency treatment if you develop breathing problems; changes in vision; chest pain; severe, sudden headache; pain, swelling, warmth in the leg; trouble speaking; sudden numbness or weakness of the face, arm, or leg. These can be signs that your condition has gotten worse. If you are going to have surgery, tell your doctor or health care professional that you are taking this medicine. Tell your health care professional that you use this medicine before you have a spinal or epidural procedure. Sometimes people who take this medicine have bleeding problems around the spine when they have a spinal or epidural procedure. This bleeding is very rare. If you have a spinal or epidural procedure while on this medicine, call your health care professional immediately if you have back pain, numbness or tingling (especially in your legs and feet), muscle weakness, paralysis, or loss of bladder or bowel control. Avoid sports and activities that might cause injury while you are using this medicine. Severe falls or injuries can cause unseen bleeding. Be careful when using sharp tools or knives. Consider using an Neurosurgeonelectric razor. Take special care brushing  or flossing your teeth. Report any injuries, bruising, or red spots on the skin to your doctor or health care professional. What side effects may I notice from receiving this medicine? Side effects that you should report to your doctor or health care professional as soon as possible: -allergic reactions like skin rash, itching or hives, swelling of the face, lips, or tongue -back pain -redness, blistering, peeling or loosening of the skin, including inside the mouth -signs and symptoms of bleeding such as bloody or black, tarry stools; red or dark-brown urine; spitting up blood or brown material that looks like coffee grounds; red spots on the skin; unusual bruising or bleeding from the eye, gums, or nose Side effects that usually do not require medical attention (Report these to your doctor or health care professional if they continue or are bothersome.): -dizziness -muscle pain This list may not describe all possible side effects. Call your doctor for medical advice about side effects. You may report side effects to FDA at 1-800-FDA-1088. Where should I keep my medicine? Keep out of the reach of children. Store at room temperature between 15 and 30 degrees C (59 and 86 degrees F). Throw away any unused medicine after the expiration date. NOTE: This sheet is a summary. It may not cover all possible information. If you have questions about this medicine, talk to your doctor, pharmacist, or health care provider.    2016,  [employer]vier/[employer] Standard. (2014-05-11 12:45:34)

## 2016-02-28 NOTE — Telephone Encounter (Signed)
Patient In office today with Dr. Abelardo DieselMcMullen for review of meds. Kaydyn Chism,CMA

## 2016-03-06 DIAGNOSIS — I87332 Chronic venous hypertension (idiopathic) with ulcer and inflammation of left lower extremity: Secondary | ICD-10-CM | POA: Diagnosis not present

## 2016-03-13 ENCOUNTER — Other Ambulatory Visit: Payer: Self-pay | Admitting: Internal Medicine

## 2016-03-14 DIAGNOSIS — I87332 Chronic venous hypertension (idiopathic) with ulcer and inflammation of left lower extremity: Secondary | ICD-10-CM | POA: Diagnosis not present

## 2016-03-21 DIAGNOSIS — I87332 Chronic venous hypertension (idiopathic) with ulcer and inflammation of left lower extremity: Secondary | ICD-10-CM | POA: Diagnosis not present

## 2016-03-28 ENCOUNTER — Encounter (HOSPITAL_BASED_OUTPATIENT_CLINIC_OR_DEPARTMENT_OTHER): Payer: Medicare Other | Attending: Internal Medicine

## 2016-03-28 DIAGNOSIS — G800 Spastic quadriplegic cerebral palsy: Secondary | ICD-10-CM | POA: Insufficient documentation

## 2016-03-28 DIAGNOSIS — L03116 Cellulitis of left lower limb: Secondary | ICD-10-CM | POA: Diagnosis not present

## 2016-03-28 DIAGNOSIS — I87332 Chronic venous hypertension (idiopathic) with ulcer and inflammation of left lower extremity: Secondary | ICD-10-CM | POA: Diagnosis present

## 2016-03-28 DIAGNOSIS — L97321 Non-pressure chronic ulcer of left ankle limited to breakdown of skin: Secondary | ICD-10-CM | POA: Diagnosis not present

## 2016-03-28 DIAGNOSIS — L89620 Pressure ulcer of left heel, unstageable: Secondary | ICD-10-CM | POA: Insufficient documentation

## 2016-04-04 DIAGNOSIS — I87332 Chronic venous hypertension (idiopathic) with ulcer and inflammation of left lower extremity: Secondary | ICD-10-CM | POA: Diagnosis not present

## 2016-04-11 DIAGNOSIS — I87332 Chronic venous hypertension (idiopathic) with ulcer and inflammation of left lower extremity: Secondary | ICD-10-CM | POA: Diagnosis not present

## 2016-04-20 ENCOUNTER — Other Ambulatory Visit: Payer: Self-pay | Admitting: Internal Medicine

## 2016-04-24 ENCOUNTER — Telehealth: Payer: Self-pay | Admitting: Internal Medicine

## 2016-04-24 NOTE — Telephone Encounter (Signed)
Needs [facility] on prevacid.  [facility] [place]

## 2016-04-25 ENCOUNTER — Other Ambulatory Visit: Payer: Self-pay | Admitting: Internal Medicine

## 2016-04-25 DIAGNOSIS — I87332 Chronic venous hypertension (idiopathic) with ulcer and inflammation of left lower extremity: Secondary | ICD-10-CM | POA: Diagnosis not present

## 2016-04-25 MED ORDER — LANSOPRAZOLE 30 MG PO CPDR: 30.0000 mg | DELAYED_RELEASE_CAPSULE | Freq: Every day | ORAL | 3 refills | Status: DC

## 2016-05-02 ENCOUNTER — Encounter (HOSPITAL_BASED_OUTPATIENT_CLINIC_OR_DEPARTMENT_OTHER): Payer: Medicare Other | Attending: Internal Medicine

## 2016-05-02 DIAGNOSIS — I87332 Chronic venous hypertension (idiopathic) with ulcer and inflammation of left lower extremity: Secondary | ICD-10-CM | POA: Insufficient documentation

## 2016-05-02 DIAGNOSIS — L89522 Pressure ulcer of left ankle, stage 2: Secondary | ICD-10-CM | POA: Insufficient documentation

## 2016-05-02 DIAGNOSIS — L89623 Pressure ulcer of left heel, stage 3: Secondary | ICD-10-CM | POA: Insufficient documentation

## 2016-05-02 DIAGNOSIS — G800 Spastic quadriplegic cerebral palsy: Secondary | ICD-10-CM | POA: Diagnosis not present

## 2016-05-02 DIAGNOSIS — L97321 Non-pressure chronic ulcer of left ankle limited to breakdown of skin: Secondary | ICD-10-CM | POA: Diagnosis not present

## 2016-05-09 ENCOUNTER — Telehealth: Payer: Self-pay | Admitting: *Deleted

## 2016-05-09 DIAGNOSIS — I87332 Chronic venous hypertension (idiopathic) with ulcer and inflammation of left lower extremity: Secondary | ICD-10-CM | POA: Diagnosis not present

## 2016-05-09 NOTE — Telephone Encounter (Signed)
Patient had Rx written on 04/25/16 for Prevacid caps daily at noon.  Patient had currently been on Prevacid solutabs daily.  Pharmacy spoke with Tresa EndoKelly, group home manager - patient should still be on Prevacid solutab.  Pharmacy has faxed form to our office for clarification, but has not received response back yet.  Can call [phone] or fax new Rx to [phone].  Will route request to patient's PCP.  Altamese Dilling~Burnard Enis, BSN, RN-BC

## 2016-05-10 NOTE — Telephone Encounter (Signed)
Please advise.  Clovis PuMartin, Zaki Gertsch L, RN

## 2016-05-14 MED ORDER — LANSOPRAZOLE 30 MG PO TBDP: 30.0000 mg | ORAL_TABLET | Freq: Every day | ORAL | 3 refills | Status: DC

## 2016-05-17 ENCOUNTER — Ambulatory Visit: Payer: Medicare Other | Admitting: Family Medicine

## 2016-05-17 ENCOUNTER — Emergency Department (HOSPITAL_COMMUNITY)
Admission: EM | Admit: 2016-05-17 | Discharge: 2016-05-17 | Disposition: A | Discharge status: Home / Self Care | Payer: Medicare Other | Attending: Emergency Medicine | Admitting: Emergency Medicine

## 2016-05-17 ENCOUNTER — Encounter (HOSPITAL_COMMUNITY): Payer: Self-pay | Admitting: Emergency Medicine

## 2016-05-17 DIAGNOSIS — Z79899 Other long term (current) drug therapy: Secondary | ICD-10-CM | POA: Diagnosis not present

## 2016-05-17 DIAGNOSIS — Z7902 Long term (current) use of antithrombotics/antiplatelets: Secondary | ICD-10-CM | POA: Insufficient documentation

## 2016-05-17 DIAGNOSIS — R300 Dysuria: Secondary | ICD-10-CM | POA: Diagnosis present

## 2016-05-17 LAB — CBC WITH DIFFERENTIAL/PLATELET
BASOS ABS: 0 10*3/uL (ref 0.0–0.1)
BASOS PCT: 1 %
Eosinophils Absolute: 0.1 10*3/uL (ref 0.0–0.7)
Eosinophils Relative: 2 %
HEMATOCRIT: 36.2 % — AB (ref 39.0–52.0)
HEMOGLOBIN: 11.8 g/dL — AB (ref 13.0–17.0)
Lymphocytes Relative: 34 %
Lymphs Abs: 2.9 10*3/uL (ref 0.7–4.0)
MCH: 24.6 pg — ABNORMAL LOW (ref 26.0–34.0)
MCHC: 32.6 g/dL (ref 30.0–36.0)
MCV: 75.6 fL — ABNORMAL LOW (ref 78.0–100.0)
MONOS PCT: 8 %
Monocytes Absolute: 0.7 10*3/uL (ref 0.1–1.0)
NEUTROS ABS: 4.7 10*3/uL (ref 1.7–7.7)
NEUTROS PCT: 55 %
Platelets: 451 10*3/uL — ABNORMAL HIGH (ref 150–400)
RBC: 4.79 MIL/uL (ref 4.22–5.81)
RDW: 17.8 % — ABNORMAL HIGH (ref 11.5–15.5)
WBC: 8.4 10*3/uL (ref 4.0–10.5)

## 2016-05-17 LAB — BASIC METABOLIC PANEL
ANION GAP: 9 (ref 5–15)
BUN: 11 mg/dL (ref 6–20)
CHLORIDE: 105 mmol/L (ref 101–111)
CO2: 27 mmol/L (ref 22–32)
CREATININE: 0.48 mg/dL — AB (ref 0.61–1.24)
Calcium: 9 mg/dL (ref 8.9–10.3)
GFR calc non Af Amer: >60 mL/min (ref >60)
Glucose, Bld: 97 mg/dL (ref 65–99)
POTASSIUM: 3.8 mmol/L (ref 3.5–5.1)
SODIUM: 141 mmol/L (ref 135–145)

## 2016-05-17 LAB — URINALYSIS, ROUTINE W REFLEX MICROSCOPIC
BILIRUBIN URINE: NEGATIVE
GLUCOSE, UA: 150 mg/dL — AB
Hgb urine dipstick: NEGATIVE
KETONES UR: NEGATIVE mg/dL
Leukocytes, UA: NEGATIVE
NITRITE: NEGATIVE
PH: 7 (ref 5.0–8.0)
Protein, ur: NEGATIVE mg/dL
SPECIFIC GRAVITY, URINE: 1.006 (ref 1.005–1.030)

## 2016-05-17 LAB — CBG MONITORING, ED: GLUCOSE-CAPILLARY: 99 mg/dL (ref 65–99)

## 2016-05-17 MED ORDER — IBUPROFEN 600 MG PO TABS: 600.0000 mg | ORAL_TABLET | Freq: Four times a day (QID) | ORAL | 0 refills | Status: DC | PRN

## 2016-05-17 NOTE — ED Triage Notes (Signed)
Pt reports dysuria. No blood noticed in urine. Denies abd pain. Aide at bedside reports pt only urinated once yesterday. However pt was able to use urinal in triage with light yellow urine. Aide reports urine was darker yesterday.
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
## 2016-05-24 DIAGNOSIS — I87332 Chronic venous hypertension (idiopathic) with ulcer and inflammation of left lower extremity: Secondary | ICD-10-CM | POA: Diagnosis not present

## 2016-06-04 ENCOUNTER — Encounter: Payer: Self-pay | Admitting: Internal Medicine

## 2016-06-04 ENCOUNTER — Ambulatory Visit (INDEPENDENT_AMBULATORY_CARE_PROVIDER_SITE_OTHER): Payer: Medicare Other | Admitting: Internal Medicine

## 2016-06-04 DIAGNOSIS — I82512 Chronic embolism and thrombosis of left femoral vein: Secondary | ICD-10-CM

## 2016-06-04 DIAGNOSIS — G808 Other cerebral palsy: Secondary | ICD-10-CM | POA: Diagnosis not present

## 2016-06-04 NOTE — Assessment & Plan Note (Addendum)
Due to quadriplegia, cerebral palsy, and polyneuropathy, patient would greatly benefit from power wheelchair, semi-electric bed with extender kit, and power lift (see detailed notes above).  - Paperwork provided by Andria Rhein from [employer] completed today for power wheelchair and signed by attending Dr. McDiarmid - [name] to fax one more form to be completed, which will require signature of attending Dr. Heron Nay information for Andria Rhein: Fax [phone] Cell [phone]

## 2016-06-04 NOTE — Assessment & Plan Note (Signed)
Patient has been on Xarelto 20mg  qd for three months now. Discussed with patient the risks and benefits of stopping Xarelto, specifically noting that as patient is permanently wheelchair-bound, he will always have an increased risk of developing DVT. Also discussed increased bleeding risk if patient continues on Xarelto, which is important given his increased risk of falls and recent history of fall leading to tibial fracture. Patient requesting to stop Xarelto at this time. Discussed signs of DVT and gave strict return precautions. Patient's caretaker also present throughout encounter and understanding signs of new DVT.  - Order provided to facility to stop Xarelto

## 2016-06-04 NOTE — Patient Instructions (Addendum)
It was nice seeing you again today [name]!  You can stop taking Xarelto. If you notice swelling, redness, or pain in your legs, please let us know right away, as these can be signs of another blood clot in your leg.   If you have any questions or concerns, please feel free to call the clinic.   Be well,  Dr. Natale MilchLancaster

## 2016-06-04 NOTE — Progress Notes (Signed)
Subjective:   Patient: [name]       Birthdate: 1960/11/10       MRN: [id number]      HPI  [name] is a 56 y.o. male presenting for power wheelchair and semi-electric bed assessment. Also to discussed continuing Xarelto treatment.    Power wheelchair Face to Face Patient would benefit from a power wheelchair due to inability to ambulate secondary to cerebral palsy and associated quadriplegia.   Reviewed and agree with PT eval.   Patient requires tilt and recline function because he is unable to pressure relieve without these function. Even though PT notes state that he can pressure relieve with a tilt, he would be unable to do so independently. He has a history of multiple pressure ulcers, many on his buttocks, and would be at higher risk for developing further ulcers if he did not have the ability to tilt and recline. Also given his history of pressure ulcers, he would benefit from a skin protection cushion. He also has a history of lower extremity edema and subsequent complications such as venous stasis cellulitis that would be alleviated with the ability to tilt and recline.   Patient requires positioning function due to his lean and inability to control positioning independently.   Semi-electric bed evaluation Patient would benefit from a semi-electric hospital bed with extender kit.  Given the patient's height, currently his feet are hanging off the end of the bed he has now that does not have an extender kit. 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 An extender kit would also allow the patient extra length so he can be properly repositioned to reduce pressure ulcers, of which he has an extensive history as well. To further reduce risk of pressure ulcers, a  pressure-reducing mattress would be of great benefit to the patient in additional to the semi-electric bed with extender kit.   Patient would also benefit from an electric lift. Currently without a lift, he requires two person lift and transfer. Patient recently suffered a tibial fracture as a result of a fall, and continued manual lifting increases his risk of another fall and possible fracture. He and his caretaker are unable to afford a lift personally.   Anticoagulation Patient currently taking Xarelto 21m after identification of DVT in LLE in 02/2016. He endorses some lower extremity edema, though significantly improved from prior. Patient's caretaker reports that his swelling is better now than it has ever been. He denies leg pain. Patient would like to discuss stopping Xarelto treatment today.   Smoking status reviewed. Patient is never smoker.   Review of Systems Endorses present but improving lower extremity swelling.  Denies abdominal pain or pain elsewhere.  Endorses present but significantly improving dysuria.  No complaints otherwise.     Objective:  Physical Exam  Constitutional: He is oriented to person, place, and time.  Very pleasant gentleman sitting in wheelchair in NAD  HENT:  Head: Normocephalic and atraumatic.  MMM  Eyes: Conjunctivae and EOM are normal. Right eye exhibits no discharge. Left eye exhibits no discharge.  Cardiovascular: Normal rate, regular rhythm and normal heart sounds.   No murmur heard. 1+ LE edema to midshin on L, trace LE edema on R  Pulmonary/Chest: Effort normal and breath sounds normal. No respiratory distress. He  has no wheezes.  Abdominal: Soft. Bowel sounds are normal. He exhibits no distension. There is no tenderness. There is no rebound and no guarding.  Musculoskeletal:  Upper extremities:  Good grip strength bilaterally. 4/5 strength bilaterally, however patient is unable to fully extend or flex upper extremities or hands.  Lower  extremities: Evaluated and graded in PT notes.  Patient unable to full extend lower extremities.   While he has good (but not full) strength, his spasticity and severely limited ROM greatly limit the use of his extremities, to the point where he meets criteria for quadriplegia and is permanently wheelchair-bound.   Neurological: He is alert and oriented to person, place, and time.  Patient unable to ambulate and is confined to wheelchair.  Skin: Skin is warm and dry.  No signs of cellulitis or ulcers present on upper or lower extremities  Psychiatric: Affect normal.      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 Discussed with patient the risks and benefits of stopping Xarelto, specifically noting that as patient is permanently wheelchair-bound, he will always have an increased risk of developing DVT. Also discussed increased bleeding risk if patient continues on Xarelto, which is important given his increased risk of falls and recent history of fall leading to tibial fracture. Patient requesting to stop Xarelto at this time. Discussed signs of DVT and gave strict return precautions. Patient's caretaker also present throughout encounter and understanding signs of new DVT.  - Order provided to facility to stop Xarelto  Congenital quadriplegia Due to quadriplegia, cerebral palsy, and polyneuropathy, patient would greatly benefit from power wheelchair, semi-electric bed with extender kit, and power lift (see detailed notes above).  - Paperwork provided by [place]ria [place] for power wheelchair and signed by attending Dr. McDiarmid - Ms. [name] Parrto fax one more form to be completed, which will require signature of attending Dr. MHeron Nayinformation for DAndria Rhein Fax [phone] [phone] AAdin Hector MD, MPH PGY-2 [facility]es[facility]  Medicine Pager [phone]

## 2016-06-19 ENCOUNTER — Telehealth: Payer: Self-pay | Admitting: Internal Medicine

## 2016-06-19 NOTE — Telephone Encounter (Signed)
[name] with [facility] dropped off form to be signed by dr McDiarmid and to be dated 06/04/2016 date patient last seen

## 2016-06-20 NOTE — Telephone Encounter (Signed)
Discussed with PCP,forms placed in Dr. McDiarmid's box.

## 2016-06-21 NOTE — Telephone Encounter (Signed)
Form for Wheelchair signed and handed off to Nursing in order to the form to be sent to appropriate recipient.

## 2016-06-28 ENCOUNTER — Ambulatory Visit (INDEPENDENT_AMBULATORY_CARE_PROVIDER_SITE_OTHER): Payer: Medicare Other | Admitting: Family Medicine

## 2016-06-28 ENCOUNTER — Encounter: Payer: Self-pay | Admitting: Family Medicine

## 2016-06-28 VITALS — BP 98/60 | HR 68 | Temp 98.0°F

## 2016-06-28 DIAGNOSIS — Z0289 Encounter for other administrative examinations: Secondary | ICD-10-CM | POA: Diagnosis present

## 2016-06-28 DIAGNOSIS — I83023 Varicose veins of left lower extremity with ulcer of ankle: Secondary | ICD-10-CM | POA: Diagnosis not present

## 2016-06-28 DIAGNOSIS — L97329 Non-pressure chronic ulcer of left ankle with unspecified severity: Secondary | ICD-10-CM

## 2016-06-28 NOTE — Patient Instructions (Addendum)
You were seen in clinic today to review your medication list and to fill out FL2 forms.  A few of your medications were discontinued as they are no longer needed, per your nursing, including Miralax, Triamcinolone and Silvadene.  Your FL2 form was signed and given to your caregiver.  Your left leg ankle wound does not appear infected at this time.  We discussed return precautions such as fevers, draining from the site, or signs of the wound worsening.  We are glad you are overall doing better!  [name] MarchYashika Noelly Lasseigne, MD

## 2016-06-28 NOTE — Progress Notes (Signed)
Subjective:   Patient ID: [name]    DOB: April 29, 1961, 56 y.o. male   MRN: [id number]  CC:  6 month medication review  [name] is a 56 yo M who presents to clinic with his caregiver today for 6 month med review and paperwork.  Has also been complaining of some left foot pain.  Was recently discharged from wound care center, per caregiver, and has nursing at home taking care of it. No other concerns at this time.    ROS: Denies fevers, chills, shortness of breath, abdominal pain, n/v/d.   Smoking status reviewed. Patient is a never smoker.  Medications reviewed.  Objective:   BP 98/60   Pulse 68   Temp 98 F (36.7 C) (Oral)  Vitals and nursing note reviewed.  General: 56 year old male, wheelchair-bound, in no acute distress HEENT: NCAT, MMM  Neck: supple CV: RRR, no murmur heard  Lungs: Normal effort of breathing, no wheezes Abdomen: soft, he exhibits no distention or tenderness, no rebound or guarding, +bs MSK: Medial wound on LLE (ankle) covered with dressing c/d/i.  No signs of infection.  Not erythematous or draining.  Skin: warm, dry Extremities: warm and well perfused, normal tone Neuro: He is alert and oriented to person, place and time.  Good grip strength in bilateral UE with 4/5 strength however unable to fully extend or flex upper or lower bilateral extremities Psych: affect normal  Assessment & Plan:   56 year old male presented with his caregiver today for medication review and forms to be filled out.   Reviewed medications with caregiver.  Per nursing supervisor, Triamcinolone, Miralax and Silvadene no longer needed.   Have updated medication list in Epic.   -FL2 form signed at today's visit.  -Copy of immunization record provided for paperwork.   Left medial foot ulcer with no erythema or drainage.  Overlying dressing dry and surrounding skin without swelling, warmth or erythema.  No concerns for infection at this time but will continue to monitor.   Patient with nursing at home to change dressing as needed.   Follow up: 3 months or sooner if needed  [name] March, MD [facility], PGY-1 07/04/2016 9:26 PM

## 2016-07-16 ENCOUNTER — Ambulatory Visit: Payer: Medicare Other | Admitting: Physical Medicine & Rehabilitation

## 2016-07-18 ENCOUNTER — Encounter: Payer: Medicare Other | Attending: Physical Medicine & Rehabilitation

## 2016-07-18 ENCOUNTER — Encounter: Payer: Self-pay | Admitting: Physical Medicine & Rehabilitation

## 2016-07-18 ENCOUNTER — Ambulatory Visit (HOSPITAL_BASED_OUTPATIENT_CLINIC_OR_DEPARTMENT_OTHER): Payer: Medicare Other | Admitting: Physical Medicine & Rehabilitation

## 2016-07-18 VITALS — BP 107/67 | HR 93

## 2016-07-18 DIAGNOSIS — G808 Other cerebral palsy: Secondary | ICD-10-CM | POA: Diagnosis not present

## 2016-07-18 DIAGNOSIS — K219 Gastro-esophageal reflux disease without esophagitis: Secondary | ICD-10-CM | POA: Diagnosis not present

## 2016-07-18 DIAGNOSIS — Z9889 Other specified postprocedural states: Secondary | ICD-10-CM | POA: Diagnosis not present

## 2016-07-18 DIAGNOSIS — M25511 Pain in right shoulder: Secondary | ICD-10-CM | POA: Insufficient documentation

## 2016-07-18 DIAGNOSIS — F419 Anxiety disorder, unspecified: Secondary | ICD-10-CM | POA: Insufficient documentation

## 2016-07-18 DIAGNOSIS — Z86718 Personal history of other venous thrombosis and embolism: Secondary | ICD-10-CM | POA: Diagnosis not present

## 2016-07-18 DIAGNOSIS — G800 Spastic quadriplegic cerebral palsy: Secondary | ICD-10-CM | POA: Insufficient documentation

## 2016-07-18 DIAGNOSIS — F329 Major depressive disorder, single episode, unspecified: Secondary | ICD-10-CM | POA: Diagnosis not present

## 2016-07-18 MED ORDER — BACLOFEN 20 MG PO TABS: 20.0000 mg | ORAL_TABLET | Freq: Three times a day (TID) | ORAL | 1 refills | Status: DC

## 2016-07-18 MED ORDER — DICLOFENAC SODIUM 1 % TD GEL: 2.0000 g | Freq: Three times a day (TID) | TRANSDERMAL | 4 refills | Status: DC

## 2016-07-18 NOTE — Progress Notes (Signed)
Subjective:    Patient ID: [name], male    DOB: 08/14/1960, 56 y.o.   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 Last visit with me was approximately 7 months ago. Interval medical history significant for cellulitis, left lower extremity hospitalized at [facility] from 814 2 01/18/2016. Also diagnosed with lower extremity DVT. Went to skilled nursing facility 01/19/2016. After spiral fracture of the left tibial was noted and transfers increased to plus 3 assist ED visit 05/17/2016 for dysuria  [facility] Pain Inventory Average Pain 10 Pain Right Now 9 My pain is sharp, dull, stabbing and aching  In the last 24 hours, has pain interfered with the following? General activity 7 Relation with others 7 Enjoyment of life 5 What TIME of day is your pain at its worst? . Sleep (in general) NA  Pain is worse with: sitting, inactivity and some activites Pain improves with: rest, therapy/exercise, medication and injections Relief from Meds: 5  Mobility ability to climb steps?  no do you drive?  no  Function disabled: date disabled . I need assistance with the following:  dressing, bathing, toileting, meal prep, household duties and shopping  Neuro/Psych bladder control problems bowel control problems weakness trouble walking depression  Prior Studies Any changes since last visit?  no  Physicians involved in your care Any changes since last visit?  no   Family History  Problem Relation Age of Onset  . Adopted: Yes   Social History   Social History  . Marital status: Single    Spouse name: N/A  . Number of children: N/A  . Years of education: N/A   Social History Main Topics  . Smoking status: Never Smoker  . Smokeless tobacco: Never Used  . Alcohol use No  . Drug use: No  . Sexual activity: No   Other Topics Concern  . Not on file   Social History Narrative  .  No narrative on file   Past Surgical History:  Procedure Laterality Date  . AMPUTATION Right 12/14/2014   Procedure: AMPUTATION DIGIT RIGHT GREAT TOE MTP;  Surgeon: Nadara Mustard, MD;  Location: [facility] OR;  Service: Orthopedics;  Laterality: Right;  . head surgery  as a baby   due to "water head"   . HERNIA REPAIR    . HIATAL HERNIA REPAIR  1982  . LEG SURGERY Bilateral ~ 1967   "legs were scissored; stretched them out"   Past Medical History:  Diagnosis Date  . Anxiety    takes Ativan daily as needed  . Bipolar disorder (HCC)    takes Depakote daily  . CEREBRAL PALSY 07/24/2006  . Chronic bronchitis (HCC)    "gets it q yr"  . Constipation    takes Miralax daily as needed  . Depression    takes Risperdal nightly  . Environmental allergies    takes Zyrtec daily  . GASTRIC ULCER ACUTE WITH HEMORRHAGE 07/24/2006  . GERD (gastroesophageal reflux disease)    takes Pravacid daily  . History of blood transfusion    no abnormal reaction noted  . History of hiatal hernia   . History of shingles   . History of stomach ulcers ~ 1996  . Hypercholesterolemia   . Pneumonia "quite a few times"   in 2015 and in June 2016  . Recurrent UTI (urinary tract infection)   . URINARY INCONTINENCE 02/09/2010   takes Detrol daily-occasionally  incontinence  . Weakness    numbness in extremities   There were no vitals taken for this visit.  Opioid Risk Score:   Fall Risk Score:  `1  Depression screen PHQ 2/9  Depression screen [facility] 2/9 01/04/2016 10/26/2015 10/19/2015 08/08/2015 06/20/2015 07/07/2014 11/09/2013  Decreased Interest 0 0 0 0 0 0 0  Down, Depressed, Hopeless 0 0 0 0 0 0 0  PHQ - 2 Score 0 0 0 0 0 0 0    Review of Systems  Constitutional: Negative.   HENT: Negative.   Eyes: Negative.   Respiratory: Negative.   Cardiovascular: Negative.   Gastrointestinal: Negative.   Endocrine: Negative.   Genitourinary: Positive for difficulty urinating.  Musculoskeletal: Positive for gait problem.    Skin: Negative.   Allergic/Immunologic: Negative.   Neurological: Positive for numbness.  Hematological: Negative.   Psychiatric/Behavioral: Positive for dysphoric mood.  All other systems reviewed and are negative.      Objective:   Physical Exam  Constitutional: He is oriented to person, place, and time. He appears well-developed and well-nourished.  HENT:  Head: Normocephalic and atraumatic.  Eyes: Conjunctivae are normal. Pupils are equal, round, and reactive to light.  Neurological: He is alert and oriented to person, place, and time.  Psychiatric: He has a normal mood and affect.  Nursing note and vitals reviewed. , no tenderness. Palpation over the right tibia. Right shoulder has negative impingement signs. No pain with external rotation. There is increased tone in the right pectoralis muscle. MAS 3 in the right biceps and brachial radialis. Ashworth grade 1-2 at the finger flexors on the right side. Responses are delayed but appropriate        Assessment & Plan:   1.  Spastic quadriplegia, CP With history of right shoulder pain. Previously, the Botox injection of the biceps and brachial radialis afforded some relief. Patient has spasticity in the right pectoralis as well. Therefore, recommend repeat injection. With the addition of pectoralis Botox Biceps150 Brachioradialis50 Pectoralis 100 U  Discussed with patient as well as his caregiver at the group home.

## 2016-07-29 ENCOUNTER — Encounter: Payer: Self-pay | Admitting: Physical Medicine & Rehabilitation

## 2016-07-29 ENCOUNTER — Ambulatory Visit (HOSPITAL_BASED_OUTPATIENT_CLINIC_OR_DEPARTMENT_OTHER): Payer: Medicare Other | Admitting: Physical Medicine & Rehabilitation

## 2016-07-29 ENCOUNTER — Encounter: Payer: Medicare Other | Attending: Physical Medicine & Rehabilitation

## 2016-07-29 VITALS — BP 130/71 | HR 63 | Resp 14

## 2016-07-29 DIAGNOSIS — G800 Spastic quadriplegic cerebral palsy: Secondary | ICD-10-CM | POA: Diagnosis not present

## 2016-07-29 DIAGNOSIS — K219 Gastro-esophageal reflux disease without esophagitis: Secondary | ICD-10-CM | POA: Insufficient documentation

## 2016-07-29 DIAGNOSIS — Z9889 Other specified postprocedural states: Secondary | ICD-10-CM | POA: Diagnosis not present

## 2016-07-29 DIAGNOSIS — Z86718 Personal history of other venous thrombosis and embolism: Secondary | ICD-10-CM | POA: Insufficient documentation

## 2016-07-29 DIAGNOSIS — F329 Major depressive disorder, single episode, unspecified: Secondary | ICD-10-CM | POA: Insufficient documentation

## 2016-07-29 DIAGNOSIS — F419 Anxiety disorder, unspecified: Secondary | ICD-10-CM | POA: Diagnosis not present

## 2016-07-29 DIAGNOSIS — G8250 Quadriplegia, unspecified: Secondary | ICD-10-CM | POA: Diagnosis not present

## 2016-07-29 DIAGNOSIS — M25511 Pain in right shoulder: Secondary | ICD-10-CM | POA: Diagnosis not present

## 2016-07-29 NOTE — Progress Notes (Signed)
   Botox Injection for spasticity using needle EMG guidance  Dilution: 50 Units/ml Indication: Severe spasticity which interferes with ADL,mobility and/or  hygiene and is unresponsive to medication management and other conservative care Informed consent was obtained after describing risks and benefits of the procedure with the patient. This includes bleeding, bruising, infection, excessive weakness, or medication side effects. A REMS form is on file and signed. Needle: 27 g 1" needle electrode Number of units per muscle Biceps150 Brachioradialis50 Pectoralis 100 U All injections were done after obtaining appropriate EMG activity and after negative drawback for blood. The patient tolerated the procedure well. Post procedure instructions were given. A followup appointment was made.

## 2016-07-29 NOTE — Patient Instructions (Signed)

## 2016-08-02 ENCOUNTER — Encounter (HOSPITAL_BASED_OUTPATIENT_CLINIC_OR_DEPARTMENT_OTHER): Payer: Medicare Other | Attending: Internal Medicine

## 2016-08-02 DIAGNOSIS — S91312A Laceration without foreign body, left foot, initial encounter: Secondary | ICD-10-CM | POA: Insufficient documentation

## 2016-08-02 DIAGNOSIS — L03116 Cellulitis of left lower limb: Secondary | ICD-10-CM | POA: Insufficient documentation

## 2016-08-02 DIAGNOSIS — X58XXXA Exposure to other specified factors, initial encounter: Secondary | ICD-10-CM | POA: Diagnosis not present

## 2016-08-02 DIAGNOSIS — I87332 Chronic venous hypertension (idiopathic) with ulcer and inflammation of left lower extremity: Secondary | ICD-10-CM | POA: Insufficient documentation

## 2016-08-02 DIAGNOSIS — L97321 Non-pressure chronic ulcer of left ankle limited to breakdown of skin: Secondary | ICD-10-CM | POA: Insufficient documentation

## 2016-08-02 DIAGNOSIS — G808 Other cerebral palsy: Secondary | ICD-10-CM | POA: Diagnosis not present

## 2016-08-15 DIAGNOSIS — I87332 Chronic venous hypertension (idiopathic) with ulcer and inflammation of left lower extremity: Secondary | ICD-10-CM | POA: Diagnosis not present

## 2016-08-22 DIAGNOSIS — I87332 Chronic venous hypertension (idiopathic) with ulcer and inflammation of left lower extremity: Secondary | ICD-10-CM | POA: Diagnosis not present

## 2016-08-30 ENCOUNTER — Other Ambulatory Visit: Payer: Self-pay | Admitting: Internal Medicine

## 2016-08-30 NOTE — Telephone Encounter (Signed)
Caregiver called and needs a prescription sent in for the patient for his percocet. jw

## 2016-09-02 MED ORDER — OXYCODONE-ACETAMINOPHEN 5-325 MG PO TABS: 1.0000 | ORAL_TABLET | Freq: Three times a day (TID) | ORAL | 0 refills | Status: DC | PRN

## 2016-09-02 NOTE — Telephone Encounter (Signed)
Pt caregiver informed. Leverne Tessler, CMA  

## 2016-09-03 ENCOUNTER — Other Ambulatory Visit: Payer: Self-pay | Admitting: Internal Medicine

## 2016-09-05 ENCOUNTER — Encounter (HOSPITAL_BASED_OUTPATIENT_CLINIC_OR_DEPARTMENT_OTHER): Payer: Medicare Other | Attending: Internal Medicine

## 2016-09-05 DIAGNOSIS — S91112A Laceration without foreign body of left great toe without damage to nail, initial encounter: Secondary | ICD-10-CM | POA: Diagnosis not present

## 2016-09-05 DIAGNOSIS — L89523 Pressure ulcer of left ankle, stage 3: Secondary | ICD-10-CM | POA: Diagnosis present

## 2016-09-05 DIAGNOSIS — G8250 Quadriplegia, unspecified: Secondary | ICD-10-CM | POA: Insufficient documentation

## 2016-09-05 DIAGNOSIS — L03116 Cellulitis of left lower limb: Secondary | ICD-10-CM | POA: Insufficient documentation

## 2016-09-05 DIAGNOSIS — Z86718 Personal history of other venous thrombosis and embolism: Secondary | ICD-10-CM | POA: Diagnosis not present

## 2016-09-05 DIAGNOSIS — L89893 Pressure ulcer of other site, stage 3: Secondary | ICD-10-CM | POA: Diagnosis not present

## 2016-09-05 DIAGNOSIS — W228XXA Striking against or struck by other objects, initial encounter: Secondary | ICD-10-CM | POA: Insufficient documentation

## 2016-09-09 ENCOUNTER — Encounter: Payer: Medicare Other | Attending: Physical Medicine & Rehabilitation

## 2016-09-09 ENCOUNTER — Ambulatory Visit: Payer: Medicare Other | Admitting: Physical Medicine & Rehabilitation

## 2016-09-09 DIAGNOSIS — Z9889 Other specified postprocedural states: Secondary | ICD-10-CM | POA: Insufficient documentation

## 2016-09-09 DIAGNOSIS — G800 Spastic quadriplegic cerebral palsy: Secondary | ICD-10-CM | POA: Insufficient documentation

## 2016-09-09 DIAGNOSIS — F419 Anxiety disorder, unspecified: Secondary | ICD-10-CM | POA: Insufficient documentation

## 2016-09-09 DIAGNOSIS — K219 Gastro-esophageal reflux disease without esophagitis: Secondary | ICD-10-CM | POA: Insufficient documentation

## 2016-09-09 DIAGNOSIS — F329 Major depressive disorder, single episode, unspecified: Secondary | ICD-10-CM | POA: Insufficient documentation

## 2016-09-09 DIAGNOSIS — Z86718 Personal history of other venous thrombosis and embolism: Secondary | ICD-10-CM | POA: Insufficient documentation

## 2016-09-09 DIAGNOSIS — M25511 Pain in right shoulder: Secondary | ICD-10-CM | POA: Insufficient documentation

## 2016-09-12 DIAGNOSIS — L89523 Pressure ulcer of left ankle, stage 3: Secondary | ICD-10-CM | POA: Diagnosis not present

## 2016-09-20 DIAGNOSIS — L89523 Pressure ulcer of left ankle, stage 3: Secondary | ICD-10-CM | POA: Diagnosis not present

## 2016-09-26 ENCOUNTER — Other Ambulatory Visit (HOSPITAL_COMMUNITY)
Admission: RE | Admit: 2016-09-26 | Discharge: 2016-09-26 | Disposition: A | Discharge status: Home / Self Care | Payer: Medicare Other | Source: Other Acute Inpatient Hospital | Attending: Internal Medicine | Admitting: Internal Medicine

## 2016-09-26 ENCOUNTER — Encounter (HOSPITAL_BASED_OUTPATIENT_CLINIC_OR_DEPARTMENT_OTHER): Payer: Medicare Other | Attending: Internal Medicine

## 2016-09-26 DIAGNOSIS — X58XXXA Exposure to other specified factors, initial encounter: Secondary | ICD-10-CM | POA: Diagnosis not present

## 2016-09-26 DIAGNOSIS — M869 Osteomyelitis, unspecified: Secondary | ICD-10-CM | POA: Diagnosis not present

## 2016-09-26 DIAGNOSIS — S91312D Laceration without foreign body, left foot, subsequent encounter: Secondary | ICD-10-CM | POA: Diagnosis not present

## 2016-09-26 DIAGNOSIS — L97321 Non-pressure chronic ulcer of left ankle limited to breakdown of skin: Secondary | ICD-10-CM | POA: Diagnosis not present

## 2016-09-26 DIAGNOSIS — I87332 Chronic venous hypertension (idiopathic) with ulcer and inflammation of left lower extremity: Secondary | ICD-10-CM | POA: Insufficient documentation

## 2016-09-26 DIAGNOSIS — G8221 Paraplegia, complete: Secondary | ICD-10-CM | POA: Diagnosis not present

## 2016-09-26 DIAGNOSIS — Z86718 Personal history of other venous thrombosis and embolism: Secondary | ICD-10-CM | POA: Insufficient documentation

## 2016-09-26 DIAGNOSIS — G629 Polyneuropathy, unspecified: Secondary | ICD-10-CM | POA: Insufficient documentation

## 2016-09-26 DIAGNOSIS — S91302A Unspecified open wound, left foot, initial encounter: Secondary | ICD-10-CM | POA: Insufficient documentation

## 2016-09-26 DIAGNOSIS — L03116 Cellulitis of left lower limb: Secondary | ICD-10-CM | POA: Diagnosis not present

## 2016-09-26 DIAGNOSIS — X58XXXD Exposure to other specified factors, subsequent encounter: Secondary | ICD-10-CM | POA: Diagnosis not present

## 2016-09-27 ENCOUNTER — Encounter (HOSPITAL_COMMUNITY): Payer: Self-pay | Admitting: Emergency Medicine

## 2016-09-27 DIAGNOSIS — H1033 Unspecified acute conjunctivitis, bilateral: Secondary | ICD-10-CM | POA: Diagnosis not present

## 2016-09-27 DIAGNOSIS — H578 Other specified disorders of eye and adnexa: Secondary | ICD-10-CM | POA: Diagnosis present

## 2016-09-27 DIAGNOSIS — Z79899 Other long term (current) drug therapy: Secondary | ICD-10-CM | POA: Diagnosis not present

## 2016-09-27 NOTE — ED Triage Notes (Signed)
Patient escorted by group home caregiver due to green drainage from right eye. Pt c/o burning sensation. Caregiver reports earlier the eye was swollen shut but swelling has gone down.

## 2016-09-28 ENCOUNTER — Emergency Department (HOSPITAL_COMMUNITY)
Admission: EM | Admit: 2016-09-28 | Discharge: 2016-09-28 | Disposition: A | Discharge status: Home / Self Care | Payer: Medicare Other | Attending: Emergency Medicine | Admitting: Emergency Medicine

## 2016-09-28 DIAGNOSIS — H1033 Unspecified acute conjunctivitis, bilateral: Secondary | ICD-10-CM

## 2016-09-28 MED ORDER — POLYMYXIN B-TRIMETHOPRIM 10000-0.1 UNIT/ML-% OP SOLN
1.0000 [drp] | OPHTHALMIC | Status: DC
  Filled 2016-09-28: qty 10

## 2016-09-28 MED ORDER — FLUORESCEIN SODIUM 0.6 MG OP STRP
1.0000 | ORAL_STRIP | Freq: Once | OPHTHALMIC | Status: AC
  Administered 2016-09-28: 1 via OPHTHALMIC
  Filled 2016-09-28: qty 1

## 2016-09-28 MED ORDER — TETRACAINE HCL 0.5 % OP SOLN
1.0000 [drp] | Freq: Once | OPHTHALMIC | Status: AC
  Administered 2016-09-28: 1 [drp] via OPHTHALMIC
  Filled 2016-09-28: qty 4

## 2016-09-28 NOTE — ED Provider Notes (Signed)
[facility]-EMERGENCY DEPT Provider Note   CSN: [id number] Arrival date & time: 09/27/16  2118     History   Chief Complaint Chief Complaint  Patient presents with  . Eye Drainage    HPI [name] is a 56 y.o. male.  Patient with past medical history remarkable for cerebral palsy, bipolar disorder, and anxiety presents to the emergency department with chief complaint of bilateral eye itching and drainage. He states the symptoms started today. He denies any changes to his vision. Denies any floaters or flashing lights. Denies any pain with eye movement. He has not tried taking anything to alleviate his symptoms. He lives in a group home. Denies any other roommates with similar symptoms. He denies any allergies to any medications. He does not use contact lenses.   The history is provided by the patient. No language interpreter was used.    Past Medical History:  Diagnosis Date  . Anxiety    takes Ativan daily as needed  . Bipolar disorder (HCC)    takes Depakote daily  . CEREBRAL PALSY 07/24/2006  . Chronic bronchitis (HCC)    "gets it q yr"  . Constipation    takes Miralax daily as needed  . Depression    takes Risperdal nightly  . Environmental allergies    takes Zyrtec daily  . GASTRIC ULCER ACUTE WITH HEMORRHAGE 07/24/2006  . GERD (gastroesophageal reflux disease)    takes Pravacid daily  . History of blood transfusion    no abnormal reaction noted  . History of hiatal hernia   . History of shingles   . History of stomach ulcers ~ 1996  . Hypercholesterolemia   . Pneumonia "quite a few times"   in 2015 and in June 2016  . Recurrent UTI (urinary tract infection)   . URINARY INCONTINENCE 02/09/2010   takes Detrol daily-occasionally incontinence  . Weakness    numbness in extremities    Patient Active Problem List   Diagnosis Date Noted  . 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 Personal history of gastric ulcer 01/20/2016  .  Shingles   . Palliative care encounter   . Left ankle pain   . Cellulitis 01/08/2016  . 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 Periorbital cellulitis of right eye 11/10/2015  . Cerebral palsy, quadriplegic (HCC)   . Fracture of distal end of tibia   . Cellulitis of left lower extremity   . Venous stasis ulcer of ankle (HCC)   . Spiral fracture of shaft of tibia 10/19/2015  . 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 Pre-diabetes 08/31/2015  . Foot ulcer, limited to breakdown of skin (HCC) 06/08/2015  . Osteomyelitis of toe of right foot (HCC) 12/14/2014  . Constipation 11/01/2014  . Seasonal allergies 11/01/2014  . Chronic osteomyelitis (HCC)   . Toe pain, right   . Abdominal distension   . Abdominal pain   . Colitis 10/15/2014  . Pancolitis (HCC) 10/15/2014  . Cough 10/14/2014  . Pressure ulcer of foot 03/14/2014  . Knee pain 11/23/2013  . Healthcare maintenance 06/24/2013  . 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 Onychomycosis 01/13/2013  . Neuropathy 01/13/2013  . Pressure ulcer 01/08/2013  . 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 Medication monitoring encounter 07/10/2011  . Hyperlipidemia 07/02/2010  . RENAL GLYCOSURIA 02/26/2010  . URINARY INCONTINENCE 02/09/2010  . 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 ECZEMA 06/09/2008  . 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 Mental retardation  07/24/2006  . Infantile cerebral palsy (HCC) 07/24/2006  . GASTRIC ULCER ACUTE WITH HEMORRHAGE 07/24/2006    Past Surgical History:  Procedure Laterality Date  . AMPUTATION Right 12/14/2014   Procedure: AMPUTATION DIGIT RIGHT GREAT TOE MTP;  Surgeon: Nadara Mustard, MD;  Location: [facility] OR;  Service: Orthopedics;  Laterality: Right;  . head surgery  as a baby   due to "water head"   . HERNIA REPAIR    . HIATAL HERNIA REPAIR  1982  . LEG SURGERY Bilateral ~ 1967   "legs were scissored; stretched them out"       Home Medications    Prior to Admission  medications   Medication Sig Start Date End Date Taking? Authorizing Provider  baclofen (LIORESAL) 20 MG tablet Take 1 tablet (20 mg total) by mouth 3 (three) times daily. 07/18/16   Kirsteins, Victorino Sparrow, MD  cetirizine (ZYRTEC) 10 MG tablet Take 1 tablet (10 mg total) by mouth daily. Patient taking differently: Take 10 mg by mouth at bedtime.  11/01/14   Street, Stephanie Coup, MD  Cholecalciferol (VITAMIN D3) 5000 units CAPS Take 5,000 Units by mouth daily.    [provider]  diclofenac sodium (VOLTAREN) 1 % GEL Apply 2 g topically 3 (three) times daily. 07/18/16   Kirsteins, Victorino Sparrow, MD  divalproex (DEPAKOTE SPRINKLE) 125 MG capsule Take 500 mg by mouth 2 (two) times daily.     [provider]  Elastic Bandages & Supports (JOBST ANTI-EM KNEE HIGH MED) MISC by Does not apply route. Apply once in the morning and remove at bedtime daily    [provider]  Foot Care Products (PREVALON HEEL PROTECTOR) MISC 1 Device by Does not apply route daily. 06/20/15   Marquette Saa, MD  Hydrocortisone Acetate 1 % CREA Apply 1 application topically daily. To face, every PM. 06/09/14   Street, Stephanie Coup, MD  ibuprofen (ADVIL,MOTRIN) 600 MG tablet Take 1 tablet (600 mg total) by mouth every 6 (six) hours as needed. 05/17/16   Georgiana Shore, PA-C  ketoconazole (NIZORAL) 2 % cream Apply 1 application topically daily. Apply to affected areas of face,skin folds, groin, and underarms    [provider]  ketoconazole (NIZORAL) 2 % shampoo USE AS DIRECTED TWICE WEEKLY 07/31/15   Marquette Saa, MD  lansoprazole (PREVACID SOLUTAB) 30 MG disintegrating tablet Take 1 tablet (30 mg total) by mouth daily. 05/14/16   Marquette Saa, MD  LORazepam (ATIVAN) 1 MG tablet Take 1 mg by mouth every 4 (four) hours as needed for anxiety.    [provider]  oxyCODONE-acetaminophen (PERCOCET/ROXICET) 5-325 MG tablet Take 1 tablet by mouth every 8 (eight)  hours as needed for moderate pain. 09/02/16   Marquette Saa, MD  Oyster Shell (OYSTER CALCIUM) 500 MG TABS tablet Take 1,500 mg of elemental calcium by mouth daily.    [provider]  risperiDONE (RISPERDAL) 2 MG tablet Take 2 mg by mouth at bedtime.     [provider]  tiZANidine (ZANAFLEX) 4 MG tablet Take 2 mg by mouth 3 (three) times daily.  07/23/15   [provider]  tolterodine (DETROL LA) 2 MG 24 hr capsule Take 1 capsule (2 mg total) by mouth daily. Patient taking differently: Take 2 mg by mouth daily at 6 PM.  04/08/13   Street, Stephanie Coup, MD  traMADol (ULTRAM) 50 MG tablet Take 1 tablet (50 mg total) by mouth every 6 (six) hours as needed for severe  pain. 10/22/15   Mayo, Allyn KennerKaty Dodd, MD  tretinoin (RETIN-A) 0.025 % gel Apply 1 application topically at bedtime. Apply to face    [provider]  vitamin C (VITAMIN C) 250 MG tablet Take 1 tablet (250 mg total) by mouth daily. 01/18/16   Palma HolterGunadasa, Kanishka G, MD    Family History Family History  Problem Relation Age of Onset  . Adopted: Yes    Social History Social History  Substance Use Topics  . Smoking status: Never Smoker  . Smokeless tobacco: Never Used  . Alcohol use No     Allergies   Adhesive [tape]   Review of Systems Review of Systems  All other systems reviewed and are negative.    Physical Exam Updated Vital Signs BP 126/88 (BP Location: Right Arm)   Pulse 83   Temp 99.4 F (37.4 C) (Oral)   Resp 18   SpO2 100%   Physical Exam  Constitutional: He is oriented to person, place, and time. He appears well-developed and well-nourished.  HENT:  Head: Normocephalic and atraumatic.  Eyes: EOM are normal.  Bilateral conjunctivae are erythematous with thick discharge, no evidence of entrapment, no pain with eye movement, no foreign body or corneal abrasion seen on slit-lamp exam, otherwise normal slit lamp exam  Neck: Normal range of motion.  Cardiovascular:  Normal rate.   Pulmonary/Chest: Effort normal.  Abdominal: He exhibits no distension.  Musculoskeletal: Normal range of motion.  Neurological: He is alert and oriented to person, place, and time.  Skin: Skin is dry.  Psychiatric: He has a normal mood and affect. His behavior is normal. Judgment and thought content normal.  Nursing note and vitals reviewed.    ED Treatments / Results  Labs (all labs ordered are listed, but only abnormal results are displayed) Labs Reviewed - No data to display  EKG  EKG Interpretation None       Radiology No results found.  Procedures Procedures (including critical care time)  Medications Ordered in ED Medications  trimethoprim-polymyxin b (POLYTRIM) ophthalmic solution 1 drop (not administered)  fluorescein ophthalmic strip 1 strip (1 strip Right Eye Given 09/28/16 0231)  tetracaine (PONTOCAINE) 0.5 % ophthalmic solution 1 drop (1 drop Right Eye Given by Other 09/28/16 0230)     Initial Impression / Assessment and Plan / ED Course  I have reviewed the triage vital signs and the nursing notes.  Pertinent labs & imaging results that were available during my care of the patient were reviewed by me and considered in my medical decision making (see chart for details).     Patient with conjunctivitis. Will treat with Polytrim eye drops. Recommend good hand hygiene. Recommend using his own linens. Patient and caregiver understand and agree with the plan. Patient is very well-appearing. He is in no apparent distress. He is stable and ready for discharge. Recommend ophthalmology follow-up of symptoms do not resolve in 48 hours.  Final Clinical Impressions(s) / ED Diagnoses   Final diagnoses:  Acute conjunctivitis of both eyes, unspecified acute conjunctivitis type    New Prescriptions New Prescriptions   No medications on file     Roxy HorsemanBrowning, Elim Economou, Cordelia Poche-C 09/28/16 0243    Zadie Rhineonald Wickline, MD 09/28/16 [phone]

## 2016-09-28 NOTE — Discharge Instructions (Signed)
Please instill 1 drop in each eye every 4 waking hours for 5 days.  Wash hands frequently.  Use your own bath and hand towels.   Changes bed linens.

## 2016-09-29 LAB — AEROBIC CULTURE  (SUPERFICIAL SPECIMEN): GRAM STAIN: NONE SEEN

## 2016-09-29 LAB — AEROBIC CULTURE W GRAM STAIN (SUPERFICIAL SPECIMEN)

## 2016-10-07 DIAGNOSIS — I87332 Chronic venous hypertension (idiopathic) with ulcer and inflammation of left lower extremity: Secondary | ICD-10-CM | POA: Diagnosis not present

## 2016-10-14 DIAGNOSIS — I87332 Chronic venous hypertension (idiopathic) with ulcer and inflammation of left lower extremity: Secondary | ICD-10-CM | POA: Diagnosis not present

## 2016-10-17 ENCOUNTER — Other Ambulatory Visit: Payer: Self-pay | Admitting: Internal Medicine

## 2016-10-31 ENCOUNTER — Encounter (HOSPITAL_BASED_OUTPATIENT_CLINIC_OR_DEPARTMENT_OTHER): Payer: Medicare Other | Attending: Internal Medicine

## 2016-10-31 DIAGNOSIS — Z86718 Personal history of other venous thrombosis and embolism: Secondary | ICD-10-CM | POA: Insufficient documentation

## 2016-10-31 DIAGNOSIS — L89893 Pressure ulcer of other site, stage 3: Secondary | ICD-10-CM | POA: Diagnosis not present

## 2016-10-31 DIAGNOSIS — I87332 Chronic venous hypertension (idiopathic) with ulcer and inflammation of left lower extremity: Secondary | ICD-10-CM | POA: Insufficient documentation

## 2016-10-31 DIAGNOSIS — L89523 Pressure ulcer of left ankle, stage 3: Secondary | ICD-10-CM | POA: Insufficient documentation

## 2016-10-31 DIAGNOSIS — G8250 Quadriplegia, unspecified: Secondary | ICD-10-CM | POA: Diagnosis not present

## 2016-10-31 DIAGNOSIS — L97529 Non-pressure chronic ulcer of other part of left foot with unspecified severity: Secondary | ICD-10-CM | POA: Diagnosis not present

## 2016-11-14 DIAGNOSIS — I87332 Chronic venous hypertension (idiopathic) with ulcer and inflammation of left lower extremity: Secondary | ICD-10-CM | POA: Diagnosis not present

## 2016-11-21 DIAGNOSIS — I87332 Chronic venous hypertension (idiopathic) with ulcer and inflammation of left lower extremity: Secondary | ICD-10-CM | POA: Diagnosis not present

## 2016-12-12 ENCOUNTER — Encounter (HOSPITAL_BASED_OUTPATIENT_CLINIC_OR_DEPARTMENT_OTHER): Payer: Medicare Other | Attending: Internal Medicine

## 2016-12-12 DIAGNOSIS — L97519 Non-pressure chronic ulcer of other part of right foot with unspecified severity: Secondary | ICD-10-CM | POA: Diagnosis not present

## 2016-12-12 DIAGNOSIS — L89523 Pressure ulcer of left ankle, stage 3: Secondary | ICD-10-CM | POA: Diagnosis not present

## 2016-12-12 DIAGNOSIS — G8250 Quadriplegia, unspecified: Secondary | ICD-10-CM | POA: Insufficient documentation

## 2016-12-12 DIAGNOSIS — Z86718 Personal history of other venous thrombosis and embolism: Secondary | ICD-10-CM | POA: Diagnosis not present

## 2016-12-12 DIAGNOSIS — L89893 Pressure ulcer of other site, stage 3: Secondary | ICD-10-CM | POA: Diagnosis present

## 2016-12-12 DIAGNOSIS — I87332 Chronic venous hypertension (idiopathic) with ulcer and inflammation of left lower extremity: Secondary | ICD-10-CM | POA: Insufficient documentation

## 2016-12-26 ENCOUNTER — Encounter (HOSPITAL_BASED_OUTPATIENT_CLINIC_OR_DEPARTMENT_OTHER): Payer: Medicare Other | Attending: Internal Medicine

## 2016-12-26 DIAGNOSIS — G8250 Quadriplegia, unspecified: Secondary | ICD-10-CM | POA: Insufficient documentation

## 2016-12-26 DIAGNOSIS — X58XXXA Exposure to other specified factors, initial encounter: Secondary | ICD-10-CM | POA: Insufficient documentation

## 2016-12-26 DIAGNOSIS — L89893 Pressure ulcer of other site, stage 3: Secondary | ICD-10-CM | POA: Insufficient documentation

## 2016-12-26 DIAGNOSIS — L03116 Cellulitis of left lower limb: Secondary | ICD-10-CM | POA: Diagnosis not present

## 2016-12-26 DIAGNOSIS — G629 Polyneuropathy, unspecified: Secondary | ICD-10-CM | POA: Diagnosis not present

## 2016-12-26 DIAGNOSIS — S91114A Laceration without foreign body of right lesser toe(s) without damage to nail, initial encounter: Secondary | ICD-10-CM | POA: Diagnosis not present

## 2016-12-26 DIAGNOSIS — Z86718 Personal history of other venous thrombosis and embolism: Secondary | ICD-10-CM | POA: Insufficient documentation

## 2017-01-10 DIAGNOSIS — L89893 Pressure ulcer of other site, stage 3: Secondary | ICD-10-CM | POA: Diagnosis not present

## 2017-01-17 ENCOUNTER — Telehealth: Payer: Self-pay | Admitting: *Deleted

## 2017-01-17 NOTE — Telephone Encounter (Signed)
Irving Burton with [facility] calling stating that as of 8/1 patient insurance (medicare D) no longer covers prevacid (brand name or generic) they request that MD either change medication or d/c it.

## 2017-01-24 MED ORDER — OMEPRAZOLE 20 MG PO CPDR: 20.0000 mg | DELAYED_RELEASE_CAPSULE | Freq: Every day | ORAL | 3 refills | Status: DC

## 2017-01-24 NOTE — Telephone Encounter (Signed)
Lansoprazole no longer on formulary for patient's insurance. Will change to omeprazole. Prescription sent.   Tarri AbernethyAbigail J Kaeleen Odom, MD, MPH PGY-3 [facility] Pager [phone]

## 2017-01-29 ENCOUNTER — Emergency Department (HOSPITAL_COMMUNITY)
Admission: EM | Admit: 2017-01-29 | Discharge: 2017-01-29 | Disposition: A | Discharge status: Home / Self Care | Payer: Medicare Other | Attending: Emergency Medicine | Admitting: Emergency Medicine

## 2017-01-29 ENCOUNTER — Encounter (HOSPITAL_COMMUNITY): Payer: Self-pay | Admitting: *Deleted

## 2017-01-29 DIAGNOSIS — Z79899 Other long term (current) drug therapy: Secondary | ICD-10-CM | POA: Diagnosis not present

## 2017-01-29 DIAGNOSIS — B354 Tinea corporis: Secondary | ICD-10-CM | POA: Diagnosis not present

## 2017-01-29 DIAGNOSIS — T7840XA Allergy, unspecified, initial encounter: Secondary | ICD-10-CM | POA: Diagnosis present

## 2017-01-29 MED ORDER — FLUCONAZOLE 150 MG PO TABS: 150.0000 mg | ORAL_TABLET | Freq: Every day | ORAL | 0 refills | Status: AC

## 2017-01-29 MED ORDER — CLOTRIMAZOLE 1 % EX CREA: TOPICAL_CREAM | CUTANEOUS | 0 refills | Status: DC

## 2017-01-29 MED ORDER — DOXYCYCLINE HYCLATE 100 MG PO CAPS: 100.0000 mg | ORAL_CAPSULE | Freq: Two times a day (BID) | ORAL | 0 refills | Status: DC
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
## 2017-01-29 NOTE — Discharge Instructions (Signed)
You were evaluated in the emergency department for rash to the right side of your body. This is a fungal infection. You have been prescribed a fungal cream and fungal pills to take. Additionally, we are protecting you against bacterial infection. Please take antibiotic as prescribed.   You should make an appointment with primary care provider no later than 3 days for reevaluation. The antifungal pills (Diflucan) can sometimes affect your liver, your primary care provider may want to get basic blood work done to monitor for any possible side effects.  Hygiene is extremely important. You should have your rash and creases of your body cleaned and dry at all times.  Return to the emergency department for worsening of redness, pain, odor, fevers, chills which would suggest worsening signs of infection.

## 2017-01-29 NOTE — ED Triage Notes (Signed)
Pt here from [facility]. Pt has been started on new GI meds on 9/1 and having itching all over since he started it. Has not been started on benadryl. Airway intact at triage and no acute distress is noted.

## 2017-01-30 ENCOUNTER — Encounter (HOSPITAL_BASED_OUTPATIENT_CLINIC_OR_DEPARTMENT_OTHER): Payer: Medicare Other | Attending: Internal Medicine

## 2017-01-30 DIAGNOSIS — G8250 Quadriplegia, unspecified: Secondary | ICD-10-CM | POA: Insufficient documentation

## 2017-01-30 DIAGNOSIS — G629 Polyneuropathy, unspecified: Secondary | ICD-10-CM | POA: Diagnosis not present

## 2017-01-30 DIAGNOSIS — Z86718 Personal history of other venous thrombosis and embolism: Secondary | ICD-10-CM | POA: Diagnosis not present

## 2017-01-30 DIAGNOSIS — Z872 Personal history of diseases of the skin and subcutaneous tissue: Secondary | ICD-10-CM | POA: Diagnosis not present

## 2017-01-30 DIAGNOSIS — Z09 Encounter for follow-up examination after completed treatment for conditions other than malignant neoplasm: Secondary | ICD-10-CM | POA: Insufficient documentation

## 2017-02-04 ENCOUNTER — Telehealth: Payer: Self-pay | Admitting: Internal Medicine

## 2017-02-04 ENCOUNTER — Encounter: Payer: Self-pay | Admitting: Internal Medicine

## 2017-02-04 ENCOUNTER — Ambulatory Visit (INDEPENDENT_AMBULATORY_CARE_PROVIDER_SITE_OTHER): Payer: Medicare Other | Admitting: Internal Medicine

## 2017-02-04 DIAGNOSIS — B354 Tinea corporis: Secondary | ICD-10-CM

## 2017-02-04 DIAGNOSIS — H6123 Impacted cerumen, bilateral: Secondary | ICD-10-CM

## 2017-02-04 DIAGNOSIS — G808 Other cerebral palsy: Secondary | ICD-10-CM | POA: Diagnosis not present

## 2017-02-04 DIAGNOSIS — H6120 Impacted cerumen, unspecified ear: Secondary | ICD-10-CM | POA: Insufficient documentation

## 2017-02-04 MED ORDER — ZINC OXIDE 40 % EX PSTE: 1.0000 "application " | PASTE | Freq: Two times a day (BID) | CUTANEOUS | 2 refills | Status: DC

## 2017-02-04 NOTE — Telephone Encounter (Signed)
Prescription sent to [facility].   Tarri AbernethyAbigail J Brion Sossamon, MD, MPH PGY-3 [facility] Pager [phone]

## 2017-02-04 NOTE — Patient Instructions (Signed)
It was nice seeing you again today [name]!  Please continue to use the clotrimazole lotion on the rash until it has run out. Also continue to take the fluconazole and doxycycline until all the pills are gone. If the red areas do not continue to get better, or if they start to get worse, please let us know.   After you have finished using the clotrimazole ointment, you can begin using the zinc oxide (Desitin) barrier cream twice a day to help prevent further rashes or skin irritation.   I will call [facility] to see what they can do to help make your chair more comfortable and prevent more problems from leaning to the right.   If there is anything else I can do, please do not hesitate to call.   Thank you for entrusting me with your care.   Be well,  Dr. Natale MilchLancaster

## 2017-02-04 NOTE — Assessment & Plan Note (Signed)
Likely cause of recently decreased hearing. Unable to clear with curette, however successfully disimpacted with irrigation.

## 2017-02-04 NOTE — Telephone Encounter (Signed)
Zinc oxide Rx was sent to wrong pharmacy. It should have gone to [facility] [place]  [phone]-fax number

## 2017-02-04 NOTE — Assessment & Plan Note (Addendum)
Having difficulty with new power wheelchair, primarily that he is constantly leaning to R in new chair despite chest harness and pillow placed on R side of chair to help prop him up. As such, is at increased risk of developing pressure ulcers on R, despite caretaker's best efforts to frequently readjust his positioning. Caretaker has already called [facility] but has not been able to get through to anyone to discuss possible options.  - Prescribed Desitin barrier cream to be used twice a day once course of clotrimazole has been completed - Called [facility]. Provided them with patient's phone number, and they said they would call him today to discuss possible solutions. Also called patient to let him know to expect their call.

## 2017-02-04 NOTE — Progress Notes (Signed)
Subjective:   Patient: [name]       Birthdate: 1961/02/27       MRN: [id number]      HPI  [name] is a 56 y.o. male presenting for ED f/u.   Patient was seen at [facility] ED on 09/05 for multiple areas of red itchy rash, primarily located on R axilla, R elbow, R buttocks, R inguinal region, and R scrotum noticed by his caretaker on 09/03. In ED, appearance of rash was most consistent with fungal infection, so patient was discharged with clotrimazole cream and oral fluconazole. Given patient's immunocompromised state, he was also discharged with doxycycline given high risk of superimposed infection, however there were no signs of infection on exam. He was encouraged to f/u with PCP within a few days to ensure lesions were improving.  Since ED, patient and his caretaker say the red areas have been improving significantly. They have been applying clotrimazole to the affected areas as prescribed, and he has been taking fluconazole and doxycycline as prescribed. Says that the areas do not itch or hurt at all anymore. Patient has been staying home from his day program for the past few days so that his caretaker can monitor him more closely. They are interested in using a barrier cream so that this will not continue to happen.  Patient's caretaker is particularly concerned because he is leaning to the right in his power wheelchair more than he has before. He recently received a new power wheelchair. He has a chest harness, but it is not holding him upright. She feels that leaning to the right is caused increased build up of moisture and pressure on certain areas and is concerned that this will continue to cause more problems for him. She has tried to talk to [facility] regarding possible solutions but has not gotten any answers yet.   Patient also reports that he has been having trouble hearing out of his R ear and thinks there may be a wax build up in that ear. This has happened to him before  and he has required irrigation for cerumen impaction.    Smoking status reviewed. Patient is never smoker.   Review of Systems See HPI.     Objective:  Physical Exam  Constitutional: He is oriented to person, place, and time.  Very pleasant chronically-ill appearing male sitting in power wheelchair. Accompanied by caretaker.   HENT:  Head: Normocephalic and atraumatic.  Significant cerumen impaction bilaterally  Pulmonary/Chest: Effort normal. No respiratory distress.  Musculoskeletal:  Significant contractures of upper and lower extremities, more prominent on R than on L. Leaning to the R in chair despite chest harness and pillow on R.   Neurological: He is alert and oriented to person, place, and time.  Skin:  Erythematous patches consistent with tinea under chin, on R elbow, on R buttocks, and on R scrotum, all improved from pictures taken during prior ED encounter. Some peeling skin particularly on R elbow. No signs of infection. Skin otherwise warm and dry.   Psychiatric: Affect and judgment normal.      Assessment & Plan:  Congenital quadriplegia Having difficulty with new power wheelchair, primarily that he is constantly leaning to R in new chair despite chest harness and pillow placed on R side of chair to help prop him up. As such, is at increased risk of developing pressure ulcers on R, despite caretaker's best efforts to frequently readjust his positioning. Caretaker has already called [facility] but has not been able to get through to anyone to discuss possible options.  - Prescribed Desitin barrier cream to be used twice a day once course of clotrimazole has been completed - Called [facility]. Provided them with patient's phone number, and they said they would call him today to discuss possible solutions. Also called patient to let him know to expect their call.   Tinea corporis Improving after 6 days of topical clotrimazole with oral fluconazole and  doxycycline. Areas improving on exam today when compared to pictures from ED visit. No signs of infection.  - Continue clotrimazole, PO fluconazole, and doxycycline - Return if no improvement or worsening  Cerumen impaction Likely cause of recently decreased hearing. Unable to clear with curette, however successfully disimpacted with irrigation.    Tarri AbernethyAbigail J Lancaster, MD, MPH PGY-3 [facility] Pager [phone]

## 2017-02-04 NOTE — Assessment & Plan Note (Signed)
Improving after 6 days of topical clotrimazole with oral fluconazole and doxycycline. Areas improving on exam today when compared to pictures from ED visit. No signs of infection.  - Continue clotrimazole, PO fluconazole, and doxycycline - Return if no improvement or worsening

## 2017-02-24 ENCOUNTER — Telehealth: Payer: Self-pay

## 2017-02-24 NOTE — Telephone Encounter (Signed)
error 

## 2017-02-26 ENCOUNTER — Telehealth: Payer: Self-pay

## 2017-02-26 NOTE — Telephone Encounter (Signed)
[name], the patients care coordinator needs Dr. Jodean Lima signature for medical necessity form for equipment. She stated that she sent a fax.

## 2017-02-26 NOTE — Telephone Encounter (Signed)
This document is in Dr. Wynn Banker 'to be reviewed' folder.  Awaiting signature

## 2017-02-27 ENCOUNTER — Other Ambulatory Visit: Payer: Self-pay | Admitting: Physical Medicine & Rehabilitation

## 2017-03-18 ENCOUNTER — Telehealth: Payer: Self-pay | Admitting: Internal Medicine

## 2017-03-18 NOTE — Telephone Encounter (Signed)
Pt Rx: oxyCODONE-acetemaniophen was not pick up on September 02, 2017. Rx was shred.

## 2017-03-18 NOTE — Telephone Encounter (Signed)
Noted. [name], CMA

## 2017-05-26 ENCOUNTER — Telehealth: Payer: Self-pay | Admitting: Physical Medicine & Rehabilitation

## 2017-05-26 ENCOUNTER — Ambulatory Visit (INDEPENDENT_AMBULATORY_CARE_PROVIDER_SITE_OTHER): Payer: Medicare Other | Admitting: Podiatry

## 2017-05-26 ENCOUNTER — Ambulatory Visit: Payer: Medicare Other | Admitting: Podiatry

## 2017-05-26 DIAGNOSIS — B351 Tinea unguium: Secondary | ICD-10-CM | POA: Diagnosis not present

## 2017-05-26 DIAGNOSIS — L97519 Non-pressure chronic ulcer of other part of right foot with unspecified severity: Secondary | ICD-10-CM

## 2017-05-26 DIAGNOSIS — L97322 Non-pressure chronic ulcer of left 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: Secondary | ICD-10-CM

## 2017-05-26 DIAGNOSIS — I83015 Varicose veins of right lower extremity with ulcer other part of foot: Secondary | ICD-10-CM | POA: Diagnosis not present

## 2017-05-26 DIAGNOSIS — M79676 Pain in unspecified toe(s): Secondary | ICD-10-CM | POA: Diagnosis not present

## 2017-05-26 DIAGNOSIS — L97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 layer exposed: Secondary | ICD-10-CM | POA: Diagnosis not present

## 2017-05-26 MED ORDER — GENTAMICIN SULFATE 0.1 % EX CREA: 1.0000 "application " | TOPICAL_CREAM | Freq: Three times a day (TID) | CUTANEOUS | 1 refills | Status: DC

## 2017-05-26 NOTE — Telephone Encounter (Signed)
Pt's nurse phoned to state that the patient needs a refill of the Voltaren cream. Please advise if patient needs to be seen or can Rx be refilled.

## 2017-05-28 MED ORDER — DICLOFENAC SODIUM 1 % TD GEL: 4.0000 g | Freq: Four times a day (QID) | TRANSDERMAL | 5 refills | Status: DC

## 2017-05-28 NOTE — Telephone Encounter (Signed)
voltaren gel 1% 4g, apply to knees QID, 5 tubes with 5 RF

## 2017-05-28 NOTE — Telephone Encounter (Signed)
Order refilled and facility notified.  [facility] [place] [facility] but [facility] [place] so reordered to that pharmacy.

## 2017-05-31 NOTE — Progress Notes (Signed)
SUBJECTIVE 57 year old wheelchair-bound male with a history of diabetes [name] presents to office today complaining of elongated, thickened nails. Pain while wearing shoes. Patient is unable to trim their own nails.  He also reports a sore lesion on the right second toe.  There are no modifying factors noted.  He has not done anything to treat the symptoms.  Patient is here for further evaluation and treatment.   Past Medical History:  Diagnosis Date  . Anxiety    takes Ativan daily as needed  . Bipolar disorder (HCC)    takes Depakote daily  . CEREBRAL PALSY 07/24/2006  . Chronic bronchitis (HCC)    "gets it q yr"  . Constipation    takes Miralax daily as needed  . Depression    takes Risperdal nightly  . Environmental allergies    takes Zyrtec daily  . GASTRIC ULCER ACUTE WITH HEMORRHAGE 07/24/2006  . GERD (gastroesophageal reflux disease)    takes Pravacid daily  . History of blood transfusion    no abnormal reaction noted  . History of hiatal hernia   . History of shingles   . History of stomach ulcers ~ 1996  . Hypercholesterolemia   . Pneumonia "quite a few times"   in 2015 and in June 2016  . Recurrent UTI (urinary tract infection)   . URINARY INCONTINENCE 02/09/2010   takes Detrol daily-occasionally incontinence  . Weakness    numbness in extremities    OBJECTIVE General Patient is awake, alert, and oriented x 3 and in no acute distress. Derm Skin is dry and supple bilateral. Nails are tender, long, thickened and dystrophic with subungual debris, consistent with onychomycosis, 1-5 bilateral. No signs of infection noted.  Wound #1 noted to the right second digit measuring approximately 0.6 x 0.6 x 0.2 cm.  Wound #2 noted to the right lateral foot measuring approximately 1.1 x 1.1 x 0.1 cm.  Wound #3 noted to the left medial ankle measuring approximately 1.2 x 1.2 x 0.1 cm.  To the above-noted ulceration, there is no eschar. There is a moderate amount of  slough, fibrin and necrotic tissue. Granulation tissue and wound base is red. There is no malodor. There is a minimal amount of serosanginous drainage noted. Periwound integrity is intact.   Vasc  DP and PT pedal pulses palpable bilaterally. Temperature gradient within normal limits.  Neuro Epicritic and protective threshold sensation diminished bilaterally.  Musculoskeletal Exam No symptomatic pedal deformities noted bilateral. Muscular strength within normal limits.  ASSESSMENT 1. Diabetes [name] w/ peripheral neuropathy 2. Onychomycosis of nail due to dermatophyte bilateral 3. Pain in foot bilateral 4.  Ulceration to the right second digit secondary to venous insufficiency 5.  Ulceration to the right lateral foot secondary to venous insufficiency 6.  Ulceration to the left medial ankle secondary to venous insufficiency  PLAN OF CARE 1. Patient evaluated today. 2. Instructed to maintain good pedal hygiene and foot care. Stressed importance of controlling blood sugar.  3. Mechanical debridement of nails 1-5 bilaterally performed using a nail nipper. Filed with dremel without incident.  4. Medically necessary excisional debridement including subcutaneous tissue was performed using a tissue nipper and a chisel blade. Excisional debridement of all the necrotic nonviable tissue down to healthy bleeding viable tissue was performed with post-debridement measurements same as pre-. 5. The wounds were cleansed and dry sterile dressings applied. 6.  Prescription for gentamicin cream provided to patient. 7.  Refer patient to [facility], Dr. Leanord Hawking, since  there is a long-standing established patient care relationship. 8.  Return to clinic in 3 months for routine nail care.   Felecia ShellingBrent M. Evans, DPM [facility]  Dr. Felecia ShellingBrent M. Evans, DPM    [address]                                        [place], [place] [address]                Office [phone]) [phone]  Fax [phone])  [phone]

## 2017-06-06 ENCOUNTER — Telehealth: Payer: Self-pay | Admitting: Internal Medicine

## 2017-06-06 ENCOUNTER — Other Ambulatory Visit: Payer: Self-pay

## 2017-06-06 ENCOUNTER — Encounter: Payer: Medicare Other | Admitting: Internal Medicine

## 2017-06-06 ENCOUNTER — Ambulatory Visit (INDEPENDENT_AMBULATORY_CARE_PROVIDER_SITE_OTHER): Payer: Medicare Other | Admitting: Family Medicine

## 2017-06-06 ENCOUNTER — Encounter: Payer: Self-pay | Admitting: Family Medicine

## 2017-06-06 VITALS — BP 108/60 | HR 68 | Temp 97.9°F | Wt 187.0 lb

## 2017-06-06 DIAGNOSIS — Z0000 Encounter for general adult medical examination without abnormal findings: Secondary | ICD-10-CM

## 2017-06-06 DIAGNOSIS — Z114 Encounter for screening for human immunodeficiency virus [HIV]: Secondary | ICD-10-CM

## 2017-06-06 DIAGNOSIS — Z5181 Encounter for therapeutic drug level monitoring: Secondary | ICD-10-CM

## 2017-06-06 DIAGNOSIS — Z8719 Personal history of other diseases of the digestive system: Secondary | ICD-10-CM | POA: Diagnosis not present

## 2017-06-06 DIAGNOSIS — Z23 Encounter for immunization: Secondary | ICD-10-CM | POA: Diagnosis not present

## 2017-06-06 DIAGNOSIS — Z8711 Personal history of peptic ulcer disease: Secondary | ICD-10-CM

## 2017-06-06 DIAGNOSIS — L97521 Non-pressure chronic ulcer of other part of left foot limited to breakdown of skin: Secondary | ICD-10-CM

## 2017-06-06 DIAGNOSIS — Z1159 Encounter for screening for other viral diseases: Secondary | ICD-10-CM

## 2017-06-06 DIAGNOSIS — R7303 Prediabetes: Secondary | ICD-10-CM

## 2017-06-06 NOTE — Assessment & Plan Note (Signed)
Stable  Following at wound center

## 2017-06-06 NOTE — Progress Notes (Signed)
Subjective  Here for HM visit.  Overall feels well no complaints.  Chronic Issues Foots ulcers - sees wound care regularly.  He and aide feel they are stable.  No fevers or discharge or increasing redness   Patient reports no  New vision/ hearing changes,anorexia, weight change, fever ,adenopathy, persistant / recurrent hoarseness, swallowing issues, chest pain, edema,persistant / recurrent cough, hemoptysis, dyspnea(rest, exertional, paroxysmal nocturnal), gastrointestinal  bleeding (melena, rectal bleeding), abdominal pain, excessive heart burn, GU symptoms(dysuria, hematuria, pyuria, new voiding/incontinence  Issues) syncope, severe memory loss, concerning skin lesions, depression, anxiety, abnormal bruising/bleeding,   Chief Complaint noted Review of Symptoms - see HPI PMH - Smoking status noted.     Objective Vital Signs reviewed Awake interactive Does not know medications but does know aide and location Resting in motorized WC leaning on his R side Can not sit up on his own Wearing padded feet ankle pads Ears:  External ear exam shows no significant lesions or deformities.  Otoscopic examination reveals clear canals, tympanic membranes are intact bilaterally without bulging, retraction, inflammation or discharge. Hearing is grossly normal bilaterall Mouth - no lesions, mucous membranes are moist, no decaying teeth   Neck:  No deformities, thyromegaly, masses, or tenderness noted.   Supple with full range of motion without pain. Heart - Regular rate and rhythm.  No murmurs, gallops or rubs.    Lungs:  Normal respiratory effort, chest expands symmetrically. Lungs are clear to auscultation, no crackles or wheezes. Abdomen: soft and non-tender without masses, organomegaly or hernias noted.  No guarding or rebound Extrem - all contracted.  Clean non infected pressure ulcers on R mid lateral foot and R second toe (missing first toe)  Left medial ankle and lateral mid foot ulcers      Assessments/Plans  Health Maintenance - will check monitoring labs and HCV and HIV screening and give flu shot.  Defer discussion of colon cancer screening   No problem-specific Assessment & Plan notes found for this encounter.   See after visit summary for details of patient instuctions

## 2017-06-06 NOTE — Telephone Encounter (Signed)
error 

## 2017-06-06 NOTE — Patient Instructions (Signed)
Good to see you today!  Thanks for coming in.  I will call you if your tests are not good.  Otherwise I will send you a letter.  If you do not hear from me with in 2 weeks please call our office.     Come back in 1 year or as needed

## 2017-06-06 NOTE — Assessment & Plan Note (Signed)
Check depakote level and CMET

## 2017-06-07 LAB — CBC
HEMATOCRIT: 37.6 % (ref 37.5–51.0)
HEMOGLOBIN: 11.3 g/dL — AB (ref 13.0–17.7)
MCH: 23.8 pg — ABNORMAL LOW (ref 26.6–33.0)
MCHC: 30.1 g/dL — ABNORMAL LOW (ref 31.5–35.7)
MCV: 79 fL (ref 79–97)
PLATELETS: 420 10*3/uL — AB (ref 150–379)
RBC: 4.74 x10E6/uL (ref 4.14–5.80)
RDW: 18 % — ABNORMAL HIGH (ref 12.3–15.4)
WBC: 6.6 10*3/uL (ref 3.4–10.8)

## 2017-06-07 LAB — CMP14+EGFR
A/G RATIO: 1.2 (ref 1.2–2.2)
ALBUMIN: 3.7 g/dL (ref 3.5–5.5)
ALT: 31 IU/L (ref 0–44)
AST: 22 IU/L (ref 0–40)
Alkaline Phosphatase: 190 IU/L — ABNORMAL HIGH (ref 39–117)
BUN / CREAT RATIO: 26 — AB (ref 9–20)
BUN: 16 mg/dL (ref 6–24)
Bilirubin Total: <0.2 mg/dL (ref 0.0–1.2)
CALCIUM: 9.7 mg/dL (ref 8.7–10.2)
CO2: 26 mmol/L (ref 20–29)
Chloride: 104 mmol/L (ref 96–106)
Creatinine, Ser: 0.61 mg/dL — ABNORMAL LOW (ref 0.76–1.27)
GFR, EST AFRICAN AMERICAN: 128 mL/min/{1.73_m2} (ref >59)
GFR, EST NON AFRICAN AMERICAN: 111 mL/min/{1.73_m2} (ref >59)
Globulin, Total: 3.1 g/dL (ref 1.5–4.5)
Glucose: 68 mg/dL (ref 65–99)
POTASSIUM: 4.3 mmol/L (ref 3.5–5.2)
Sodium: 143 mmol/L (ref 134–144)
TOTAL PROTEIN: 6.8 g/dL (ref 6.0–8.5)

## 2017-06-07 LAB — HIV ANTIBODY (ROUTINE TESTING W REFLEX): HIV SCREEN 4TH GENERATION: NONREACTIVE

## 2017-06-07 LAB — HEPATITIS C ANTIBODY: Hep C Virus Ab: 0.1 s/co ratio (ref 0.0–0.9)

## 2017-06-07 LAB — VALPROIC ACID LEVEL: Valproic Acid Lvl: 53 ug/mL (ref 50–100)

## 2017-06-11 ENCOUNTER — Telehealth: Payer: Self-pay | Admitting: Internal Medicine

## 2017-06-11 NOTE — Telephone Encounter (Signed)
Attempted to call patient regarding elevated alk phos. Number listed is not patient's correct number. Will attempt to locate correct number to call patient/his caretaker and ask him to come in for further bloodwork. Will obtain GGT to confirm that alk phos is of hepatic origin. AST and ALT are WNL, which is reassuring. Hep C also neg. Could consider full hepatitis panel. Could also consider RUQ [place] to look for biliary dilatation.   [name] Hector, MD, MPH PGY-3 [facility] Pager [phone]

## 2017-06-27 ENCOUNTER — Encounter (HOSPITAL_BASED_OUTPATIENT_CLINIC_OR_DEPARTMENT_OTHER): Payer: Medicare Other | Attending: Internal Medicine

## 2017-06-27 DIAGNOSIS — G8250 Quadriplegia, unspecified: Secondary | ICD-10-CM | POA: Insufficient documentation

## 2017-06-27 DIAGNOSIS — L97511 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 breakdown of skin: Secondary | ICD-10-CM | POA: Insufficient documentation

## 2017-06-27 DIAGNOSIS — G629 Polyneuropathy, unspecified: Secondary | ICD-10-CM | POA: Insufficient documentation

## 2017-06-27 DIAGNOSIS — L97321 Non-pressure chronic ulcer of left ankle limited to breakdown of skin: Secondary | ICD-10-CM | POA: Insufficient documentation

## 2017-06-27 DIAGNOSIS — Z86718 Personal history of other venous thrombosis and embolism: Secondary | ICD-10-CM | POA: Insufficient documentation

## 2017-07-01 ENCOUNTER — Other Ambulatory Visit: Payer: Self-pay | Admitting: Internal Medicine

## 2017-07-14 ENCOUNTER — Other Ambulatory Visit (HOSPITAL_COMMUNITY)
Admission: RE | Admit: 2017-07-14 | Discharge: 2017-07-14 | Disposition: A | Discharge status: Home / Self Care | Payer: Medicare Other | Source: Other Acute Inpatient Hospital | Attending: Internal Medicine | Admitting: Internal Medicine

## 2017-07-14 DIAGNOSIS — L97519 Non-pressure chronic ulcer of other part of right foot with unspecified severity: Secondary | ICD-10-CM | POA: Diagnosis present

## 2017-07-14 DIAGNOSIS — G8250 Quadriplegia, unspecified: Secondary | ICD-10-CM | POA: Diagnosis not present

## 2017-07-14 DIAGNOSIS — G629 Polyneuropathy, unspecified: Secondary | ICD-10-CM | POA: Diagnosis not present

## 2017-07-14 DIAGNOSIS — L97321 Non-pressure chronic ulcer of left ankle limited to breakdown of skin: Secondary | ICD-10-CM | POA: Diagnosis not present

## 2017-07-14 DIAGNOSIS — L97511 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 breakdown of skin: Secondary | ICD-10-CM | POA: Diagnosis present

## 2017-07-14 DIAGNOSIS — Z86718 Personal history of other venous thrombosis and embolism: Secondary | ICD-10-CM | POA: Diagnosis not present

## 2017-07-18 ENCOUNTER — Other Ambulatory Visit: Payer: Self-pay | Admitting: Internal Medicine

## 2017-07-18 DIAGNOSIS — I739 Peripheral vascular disease, unspecified: Secondary | ICD-10-CM

## 2017-07-18 LAB — AEROBIC CULTURE W GRAM STAIN (SUPERFICIAL SPECIMEN)

## 2017-07-18 LAB — AEROBIC CULTURE  (SUPERFICIAL SPECIMEN)

## 2017-07-21 DIAGNOSIS — L97511 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 breakdown of skin: Secondary | ICD-10-CM | POA: Diagnosis not present

## 2017-07-24 ENCOUNTER — Ambulatory Visit (HOSPITAL_COMMUNITY): Admission: RE | Admit: 2017-07-24 | Payer: Medicare Other | Source: Ambulatory Visit

## 2017-07-28 ENCOUNTER — Encounter (HOSPITAL_BASED_OUTPATIENT_CLINIC_OR_DEPARTMENT_OTHER): Payer: Medicare Other | Attending: Internal Medicine

## 2017-07-28 DIAGNOSIS — Z86718 Personal history of other venous thrombosis and embolism: Secondary | ICD-10-CM | POA: Diagnosis not present

## 2017-07-28 DIAGNOSIS — L97319 Non-pressure chronic ulcer of right ankle with unspecified severity: Secondary | ICD-10-CM | POA: Insufficient documentation

## 2017-07-28 DIAGNOSIS — L97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 layer exposed: Secondary | ICD-10-CM | POA: Diagnosis not present

## 2017-07-28 DIAGNOSIS — G629 Polyneuropathy, unspecified: Secondary | ICD-10-CM | POA: Insufficient documentation

## 2017-07-28 DIAGNOSIS — L97322 Non-pressure chronic ulcer of left 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: Secondary | ICD-10-CM | POA: Diagnosis not present

## 2017-07-28 DIAGNOSIS — G8250 Quadriplegia, unspecified: Secondary | ICD-10-CM | POA: Diagnosis not present

## 2017-07-30 ENCOUNTER — Ambulatory Visit (HOSPITAL_COMMUNITY)
Admission: RE | Admit: 2017-07-30 | Discharge: 2017-07-30 | Disposition: A | Discharge status: Home / Self Care | Payer: Medicare Other | Source: Ambulatory Visit | Attending: Cardiovascular Disease | Admitting: Cardiovascular Disease

## 2017-07-30 DIAGNOSIS — I739 Peripheral vascular disease, unspecified: Secondary | ICD-10-CM

## 2017-08-04 DIAGNOSIS — L97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 layer exposed: Secondary | ICD-10-CM | POA: Diagnosis not present

## 2017-08-15 ENCOUNTER — Other Ambulatory Visit: Payer: Self-pay | Admitting: Internal Medicine

## 2017-08-18 DIAGNOSIS — L97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 layer exposed: Secondary | ICD-10-CM | POA: Diagnosis not present

## 2017-08-25 ENCOUNTER — Ambulatory Visit: Payer: Medicare Other | Admitting: Podiatry

## 2017-09-01 ENCOUNTER — Encounter (HOSPITAL_BASED_OUTPATIENT_CLINIC_OR_DEPARTMENT_OTHER): Payer: Medicare Other | Attending: Internal Medicine

## 2017-09-01 ENCOUNTER — Other Ambulatory Visit (HOSPITAL_COMMUNITY)
Admission: RE | Admit: 2017-09-01 | Discharge: 2017-09-01 | Disposition: A | Discharge status: Home / Self Care | Payer: Medicare Other | Source: Other Acute Inpatient Hospital | Attending: Internal Medicine | Admitting: Internal Medicine

## 2017-09-01 DIAGNOSIS — L89893 Pressure ulcer of other site, stage 3: Secondary | ICD-10-CM | POA: Insufficient documentation

## 2017-09-01 DIAGNOSIS — L89523 Pressure ulcer of left ankle, stage 3: Secondary | ICD-10-CM | POA: Diagnosis not present

## 2017-09-01 DIAGNOSIS — Z86718 Personal history of other venous thrombosis and embolism: Secondary | ICD-10-CM | POA: Insufficient documentation

## 2017-09-01 DIAGNOSIS — L97516 Non-pressure chronic ulcer of other part of right foot with bone involvement without evidence of necrosis: Secondary | ICD-10-CM | POA: Insufficient documentation

## 2017-09-01 DIAGNOSIS — L97519 Non-pressure chronic ulcer of other part of right foot with unspecified severity: Secondary | ICD-10-CM | POA: Insufficient documentation

## 2017-09-01 DIAGNOSIS — G8250 Quadriplegia, unspecified: Secondary | ICD-10-CM | POA: Insufficient documentation

## 2017-09-03 LAB — AEROBIC CULTURE W GRAM STAIN (SUPERFICIAL SPECIMEN)

## 2017-09-08 ENCOUNTER — Ambulatory Visit (INDEPENDENT_AMBULATORY_CARE_PROVIDER_SITE_OTHER): Payer: Medicare Other | Admitting: Podiatry

## 2017-09-08 ENCOUNTER — Encounter: Payer: Self-pay | Admitting: Podiatry

## 2017-09-08 DIAGNOSIS — B351 Tinea unguium: Secondary | ICD-10-CM | POA: Diagnosis not present

## 2017-09-08 DIAGNOSIS — M79676 Pain in unspecified toe(s): Secondary | ICD-10-CM | POA: Diagnosis not present

## 2017-09-09 DIAGNOSIS — L97516 Non-pressure chronic ulcer of other part of right foot with bone involvement without evidence of necrosis: Secondary | ICD-10-CM | POA: Diagnosis not present

## 2017-09-12 NOTE — Progress Notes (Signed)
   SUBJECTIVE Patient presents to office today complaining of elongated, thickened nails that cause pain. He is unable to trim his own nails. He is wheelchair bound and seeing the wound care center for management of his ulcerations of bilateral lower extremities. Patient is here for further evaluation and treatment.  Past Medical History:  Diagnosis Date  . Anxiety    takes Ativan daily as needed  . Bipolar disorder (HCC)    takes Depakote daily  . CEREBRAL PALSY 07/24/2006  . Chronic bronchitis (HCC)    "gets it q yr"  . Constipation    takes Miralax daily as needed  . Depression    takes Risperdal nightly  . Environmental allergies    takes Zyrtec daily  . GASTRIC ULCER ACUTE WITH HEMORRHAGE 07/24/2006  . GERD (gastroesophageal reflux disease)    takes Pravacid daily  . History of blood transfusion    no abnormal reaction noted  . History of hiatal hernia   . History of shingles   . History of stomach ulcers ~ 1996  . Hypercholesterolemia   . Pneumonia "quite a few times"   in 2015 and in June 2016  . Recurrent UTI (urinary tract infection)   . URINARY INCONTINENCE 02/09/2010   takes Detrol daily-occasionally incontinence  . Weakness    numbness in extremities    OBJECTIVE General Patient is awake, alert, and oriented x 3 and in no acute distress. Derm Skin is dry and supple bilateral. Negative open lesions or macerations. Remaining integument unremarkable. Nails are tender, long, thickened and dystrophic with subungual debris, consistent with onychomycosis, 1-5 bilateral. No signs of infection noted. Vasc  DP and PT pedal pulses palpable bilaterally. Temperature gradient within normal limits.  Neuro Epicritic and protective threshold sensation grossly intact bilaterally.  Musculoskeletal Exam No symptomatic pedal deformities noted bilateral. Muscular strength within normal limits.  ASSESSMENT 1. Onychodystrophic nails 1-5 bilateral with hyperkeratosis of nails.  2.  Onychomycosis of nail due to dermatophyte bilateral 3. Pain in foot bilateral 4. Ulcerations to BLE - dressings left intact  PLAN OF CARE 1. Patient evaluated today.  2. Instructed to maintain good pedal hygiene and foot care.  3. Mechanical debridement of nails 1-5 bilaterally performed using a nail nipper. Filed with dremel without incident.  4. Continue follow up for wound care at [facility].  5. Return to clinic in 4 mos.    Eboni Coval M. Lexxi Koslow, DPM [facility]  Dr. Kirstein Baxley M. Garin Mata, DPM    [address]                                        [place], [place] [address]                Office [phone]  Fax [phone]     

## 2017-09-17 ENCOUNTER — Other Ambulatory Visit: Payer: Self-pay | Admitting: Internal Medicine

## 2017-09-25 ENCOUNTER — Encounter (HOSPITAL_BASED_OUTPATIENT_CLINIC_OR_DEPARTMENT_OTHER): Payer: Medicare Other | Attending: Internal Medicine

## 2017-09-25 DIAGNOSIS — S90414A Abrasion, right lesser toe(s), initial encounter: Secondary | ICD-10-CM | POA: Insufficient documentation

## 2017-09-25 DIAGNOSIS — G629 Polyneuropathy, unspecified: Secondary | ICD-10-CM | POA: Insufficient documentation

## 2017-09-25 DIAGNOSIS — L89523 Pressure ulcer of left ankle, stage 3: Secondary | ICD-10-CM | POA: Insufficient documentation

## 2017-09-25 DIAGNOSIS — L89309 Pressure ulcer of unspecified buttock, unspecified stage: Secondary | ICD-10-CM | POA: Insufficient documentation

## 2017-09-25 DIAGNOSIS — G8250 Quadriplegia, unspecified: Secondary | ICD-10-CM | POA: Insufficient documentation

## 2017-09-25 DIAGNOSIS — L89893 Pressure ulcer of other site, stage 3: Secondary | ICD-10-CM | POA: Insufficient documentation

## 2017-09-25 DIAGNOSIS — Z86718 Personal history of other venous thrombosis and embolism: Secondary | ICD-10-CM | POA: Insufficient documentation

## 2017-09-25 DIAGNOSIS — X58XXXA Exposure to other specified factors, initial encounter: Secondary | ICD-10-CM | POA: Insufficient documentation

## 2017-09-25 DIAGNOSIS — L89891 Pressure ulcer of other site, stage 1: Secondary | ICD-10-CM | POA: Insufficient documentation

## 2017-10-09 DIAGNOSIS — L89893 Pressure ulcer of other site, stage 3: Secondary | ICD-10-CM | POA: Diagnosis not present

## 2017-10-09 DIAGNOSIS — G629 Polyneuropathy, unspecified: Secondary | ICD-10-CM | POA: Diagnosis not present

## 2017-10-09 DIAGNOSIS — G8250 Quadriplegia, unspecified: Secondary | ICD-10-CM | POA: Diagnosis not present

## 2017-10-09 DIAGNOSIS — L89891 Pressure ulcer of other site, stage 1: Secondary | ICD-10-CM | POA: Diagnosis not present

## 2017-10-09 DIAGNOSIS — L89309 Pressure ulcer of unspecified buttock, unspecified stage: Secondary | ICD-10-CM | POA: Diagnosis not present

## 2017-10-09 DIAGNOSIS — L89523 Pressure ulcer of left ankle, stage 3: Secondary | ICD-10-CM | POA: Diagnosis not present

## 2017-10-09 DIAGNOSIS — X58XXXA Exposure to other specified factors, initial encounter: Secondary | ICD-10-CM | POA: Diagnosis not present

## 2017-10-09 DIAGNOSIS — Z86718 Personal history of other venous thrombosis and embolism: Secondary | ICD-10-CM | POA: Diagnosis not present

## 2017-10-09 DIAGNOSIS — S90414A Abrasion, right lesser toe(s), initial encounter: Secondary | ICD-10-CM | POA: Diagnosis not present

## 2017-10-21 ENCOUNTER — Other Ambulatory Visit: Payer: Self-pay | Admitting: Internal Medicine

## 2017-10-24 DIAGNOSIS — L89523 Pressure ulcer of left ankle, stage 3: Secondary | ICD-10-CM | POA: Diagnosis not present

## 2017-11-07 ENCOUNTER — Encounter (HOSPITAL_BASED_OUTPATIENT_CLINIC_OR_DEPARTMENT_OTHER): Payer: Medicare Other | Attending: Internal Medicine

## 2017-11-07 DIAGNOSIS — L97511 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 breakdown of skin: Secondary | ICD-10-CM | POA: Diagnosis present

## 2017-11-21 DIAGNOSIS — L97511 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 breakdown of skin: Secondary | ICD-10-CM | POA: Diagnosis not present

## 2017-12-12 ENCOUNTER — Encounter (HOSPITAL_BASED_OUTPATIENT_CLINIC_OR_DEPARTMENT_OTHER): Payer: Medicare Other | Attending: Internal Medicine

## 2017-12-12 DIAGNOSIS — Z86718 Personal history of other venous thrombosis and embolism: Secondary | ICD-10-CM | POA: Insufficient documentation

## 2017-12-12 DIAGNOSIS — L97519 Non-pressure chronic ulcer of other part of right foot with unspecified severity: Secondary | ICD-10-CM | POA: Insufficient documentation

## 2017-12-12 DIAGNOSIS — L89893 Pressure ulcer of other site, stage 3: Secondary | ICD-10-CM | POA: Insufficient documentation

## 2017-12-25 ENCOUNTER — Encounter (HOSPITAL_BASED_OUTPATIENT_CLINIC_OR_DEPARTMENT_OTHER): Payer: Medicare Other | Attending: Internal Medicine

## 2017-12-25 DIAGNOSIS — G8250 Quadriplegia, unspecified: Secondary | ICD-10-CM | POA: Insufficient documentation

## 2017-12-25 DIAGNOSIS — F79 Unspecified intellectual disabilities: Secondary | ICD-10-CM | POA: Insufficient documentation

## 2017-12-25 DIAGNOSIS — Z89411 Acquired absence of right great toe: Secondary | ICD-10-CM | POA: Insufficient documentation

## 2017-12-25 DIAGNOSIS — L89312 Pressure ulcer of right buttock, stage 2: Secondary | ICD-10-CM | POA: Diagnosis not present

## 2017-12-25 DIAGNOSIS — L98499 Non-pressure chronic ulcer of skin of other sites with unspecified severity: Secondary | ICD-10-CM | POA: Insufficient documentation

## 2017-12-25 DIAGNOSIS — L89893 Pressure ulcer of other site, stage 3: Secondary | ICD-10-CM | POA: Diagnosis not present

## 2017-12-25 DIAGNOSIS — B965 Pseudomonas (aeruginosa) (mallei) (pseudomallei) as the cause of diseases classified elsewhere: Secondary | ICD-10-CM | POA: Insufficient documentation

## 2017-12-25 DIAGNOSIS — L03115 Cellulitis of right lower limb: Secondary | ICD-10-CM | POA: Diagnosis not present

## 2017-12-25 DIAGNOSIS — L89152 Pressure ulcer of sacral region, stage 2: Secondary | ICD-10-CM | POA: Diagnosis not present

## 2017-12-25 DIAGNOSIS — L89322 Pressure ulcer of left buttock, stage 2: Secondary | ICD-10-CM | POA: Insufficient documentation

## 2018-01-05 ENCOUNTER — Ambulatory Visit: Payer: Medicare Other | Admitting: Podiatry

## 2018-01-08 ENCOUNTER — Other Ambulatory Visit (HOSPITAL_COMMUNITY)
Admission: RE | Admit: 2018-01-08 | Discharge: 2018-01-08 | Disposition: A | Discharge status: Home / Self Care | Payer: Medicare Other | Source: Other Acute Inpatient Hospital | Attending: Physician Assistant | Admitting: Physician Assistant

## 2018-01-08 DIAGNOSIS — L89322 Pressure ulcer of left buttock, stage 2: Secondary | ICD-10-CM | POA: Diagnosis not present

## 2018-01-08 DIAGNOSIS — L97511 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 breakdown of skin: Secondary | ICD-10-CM | POA: Diagnosis present

## 2018-01-11 LAB — AEROBIC CULTURE  (SUPERFICIAL SPECIMEN): GRAM STAIN: NONE SEEN

## 2018-01-11 LAB — AEROBIC CULTURE W GRAM STAIN (SUPERFICIAL SPECIMEN)

## 2018-01-16 DIAGNOSIS — L89322 Pressure ulcer of left buttock, stage 2: Secondary | ICD-10-CM | POA: Diagnosis not present

## 2018-01-19 ENCOUNTER — Ambulatory Visit (INDEPENDENT_AMBULATORY_CARE_PROVIDER_SITE_OTHER): Payer: Medicare Other | Admitting: Podiatry

## 2018-01-19 DIAGNOSIS — M79676 Pain in unspecified toe(s): Secondary | ICD-10-CM

## 2018-01-19 DIAGNOSIS — B351 Tinea unguium: Secondary | ICD-10-CM

## 2018-01-21 NOTE — Progress Notes (Signed)
   SUBJECTIVE Patient presents to office today complaining of elongated, thickened nails that cause pain. He is unable to trim his own nails. He is wheelchair bound and seeing the wound care center for management of his ulcerations of bilateral lower extremities. Patient is here for further evaluation and treatment.  Past Medical History:  Diagnosis Date  . Anxiety    takes Ativan daily as needed  . Bipolar disorder (HCC)    takes Depakote daily  . CEREBRAL PALSY 07/24/2006  . Chronic bronchitis (HCC)    "gets it q yr"  . Constipation    takes Miralax daily as needed  . Depression    takes Risperdal nightly  . Environmental allergies    takes Zyrtec daily  . GASTRIC ULCER ACUTE WITH HEMORRHAGE 07/24/2006  . GERD (gastroesophageal reflux disease)    takes Pravacid daily  . History of blood transfusion    no abnormal reaction noted  . History of hiatal hernia   . History of shingles   . History of stomach ulcers ~ 1996  . Hypercholesterolemia   . Pneumonia "quite a few times"   in 2015 and in June 2016  . Recurrent UTI (urinary tract infection)   . URINARY INCONTINENCE 02/09/2010   takes Detrol daily-occasionally incontinence  . Weakness    numbness in extremities    OBJECTIVE General Patient is awake, alert, and oriented x 3 and in no acute distress. Derm Skin is dry and supple bilateral. Negative open lesions or macerations. Remaining integument unremarkable. Nails are tender, long, thickened and dystrophic with subungual debris, consistent with onychomycosis, 1-5 bilateral. No signs of infection noted. Vasc  DP and PT pedal pulses palpable bilaterally. Temperature gradient within normal limits.  Neuro Epicritic and protective threshold sensation grossly intact bilaterally.  Musculoskeletal Exam No symptomatic pedal deformities noted bilateral. Muscular strength within normal limits.  ASSESSMENT 1. Onychodystrophic nails 1-5 bilateral with hyperkeratosis of nails.  2.  Onychomycosis of nail due to dermatophyte bilateral 3. Pain in foot bilateral 4. Ulcerations to BLE - dressings left intact  PLAN OF CARE 1. Patient evaluated today.  2. Instructed to maintain good pedal hygiene and foot care.  3. Mechanical debridement of nails 1-5 bilaterally performed using a nail nipper. Filed with dremel without incident.  4. [facility].  5. Return to clinic in 4 mos.    Felecia ShellingBrent M. Ammanda Dobbins, DPM [facility]  Dr. Felecia ShellingBrent M. Abdelrahman Nair, DPM    [address]                                        [place], [place] [address]                Office [phone]  Fax [phone]) [phone]

## 2018-01-30 ENCOUNTER — Encounter (HOSPITAL_BASED_OUTPATIENT_CLINIC_OR_DEPARTMENT_OTHER): Payer: Medicare Other | Attending: Internal Medicine

## 2018-01-30 DIAGNOSIS — G629 Polyneuropathy, unspecified: Secondary | ICD-10-CM | POA: Insufficient documentation

## 2018-01-30 DIAGNOSIS — Z86718 Personal history of other venous thrombosis and embolism: Secondary | ICD-10-CM | POA: Insufficient documentation

## 2018-01-30 DIAGNOSIS — L97511 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 breakdown of skin: Secondary | ICD-10-CM | POA: Insufficient documentation

## 2018-01-30 DIAGNOSIS — L03115 Cellulitis of right lower limb: Secondary | ICD-10-CM | POA: Diagnosis not present

## 2018-01-30 DIAGNOSIS — L97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 layer exposed: Secondary | ICD-10-CM | POA: Diagnosis not present

## 2018-02-05 ENCOUNTER — Encounter (HOSPITAL_COMMUNITY): Payer: Self-pay

## 2018-02-05 ENCOUNTER — Ambulatory Visit (HOSPITAL_COMMUNITY)
Admission: EM | Admit: 2018-02-05 | Discharge: 2018-02-05 | Disposition: A | Discharge status: Home / Self Care | Payer: Medicare Other | Attending: Family Medicine | Admitting: Family Medicine

## 2018-02-05 DIAGNOSIS — Z79891 Long term (current) use of opiate analgesic: Secondary | ICD-10-CM | POA: Diagnosis not present

## 2018-02-05 DIAGNOSIS — F319 Bipolar disorder, unspecified: Secondary | ICD-10-CM | POA: Diagnosis not present

## 2018-02-05 DIAGNOSIS — R7303 Prediabetes: Secondary | ICD-10-CM | POA: Diagnosis not present

## 2018-02-05 DIAGNOSIS — L89523 Pressure ulcer of left ankle, stage 3: Secondary | ICD-10-CM | POA: Insufficient documentation

## 2018-02-05 DIAGNOSIS — N3001 Acute cystitis with hematuria: Secondary | ICD-10-CM

## 2018-02-05 DIAGNOSIS — F419 Anxiety disorder, unspecified: Secondary | ICD-10-CM | POA: Diagnosis not present

## 2018-02-05 DIAGNOSIS — Z888 Allergy status to other drugs, medicaments and biological substances status: Secondary | ICD-10-CM | POA: Diagnosis not present

## 2018-02-05 DIAGNOSIS — Z89421 Acquired absence of other right toe(s): Secondary | ICD-10-CM | POA: Insufficient documentation

## 2018-02-05 DIAGNOSIS — R300 Dysuria: Secondary | ICD-10-CM

## 2018-02-05 DIAGNOSIS — Z9889 Other specified postprocedural states: Secondary | ICD-10-CM | POA: Diagnosis not present

## 2018-02-05 DIAGNOSIS — R109 Unspecified abdominal pain: Secondary | ICD-10-CM

## 2018-02-05 DIAGNOSIS — Z79899 Other long term (current) drug therapy: Secondary | ICD-10-CM | POA: Diagnosis not present

## 2018-02-05 LAB — POCT URINALYSIS DIP (DEVICE)
GLUCOSE, UA: NEGATIVE mg/dL
NITRITE: POSITIVE — AB
Protein, ur: >=300 mg/dL — AB
SPECIFIC GRAVITY, URINE: 1.025 (ref 1.005–1.030)
UROBILINOGEN UA: 2 mg/dL — AB (ref 0.0–1.0)
pH: 7 (ref 5.0–8.0)

## 2018-02-05 MED ORDER — CEFTRIAXONE SODIUM 1 G IJ SOLR
1.0000 g | Freq: Once | INTRAMUSCULAR | Status: AC
  Administered 2018-02-05: 1 g via INTRAMUSCULAR

## 2018-02-05 MED ORDER — TAMSULOSIN HCL 0.4 MG PO CAPS: 0.4000 mg | ORAL_CAPSULE | Freq: Every day | ORAL | 0 refills | Status: DC

## 2018-02-05 MED ORDER — OXYCODONE-ACETAMINOPHEN 5-325 MG PO TABS: 1.0000 | ORAL_TABLET | Freq: Four times a day (QID) | ORAL | 0 refills | Status: AC | PRN

## 2018-02-05 MED ORDER — LIDOCAINE HCL (PF) 1 % IJ SOLN
INTRAMUSCULAR | Status: AC
  Filled 2018-02-05: qty 4

## 2018-02-05 MED ORDER — KETOROLAC TROMETHAMINE 60 MG/2ML IM SOLN
INTRAMUSCULAR | Status: AC
  Filled 2018-02-05: qty 2

## 2018-02-05 MED ORDER — KETOROLAC TROMETHAMINE 60 MG/2ML IM SOLN
60.0000 mg | Freq: Once | INTRAMUSCULAR | Status: AC
  Administered 2018-02-05: 60 mg via INTRAMUSCULAR

## 2018-02-05 MED ORDER — NITROFURANTOIN MONOHYD MACRO 100 MG PO CAPS: 100.0000 mg | ORAL_CAPSULE | Freq: Two times a day (BID) | ORAL | 0 refills | Status: DC

## 2018-02-05 MED ORDER — CEFTRIAXONE SODIUM 1 G IJ SOLR
INTRAMUSCULAR | Status: AC
  Filled 2018-02-05: qty 10

## 2018-02-05 NOTE — ED Provider Notes (Addendum)
[facility]    CSN: [id number] Arrival date & time: 02/05/18  1723     History   Chief Complaint Chief Complaint  Patient presents with  . Urinary Tract Infection    HPI [name] Rhineerry L Knobel is a 57 y.o. male.   Subjective:  [name] is a 57 y.o. male who complains of dysuria, foul smelling urine, left flank pain and suprapubic pressure for 3 days. The patient developed hematuria just today. Patient denies back pain, nausea, vomiting or fever.  Patient does have a history of recurrent UTI.  Patient does not have a history of pyelonephritis.  The following portions of the patient's history were reviewed and updated as appropriate: allergies, current medications, past family history, past medical history, past social history, past surgical history and problem list.         Past Medical History:  Diagnosis Date  . Anxiety    takes Ativan daily as needed  . Bipolar disorder (HCC)    takes Depakote daily  . CEREBRAL PALSY 07/24/2006  . Chronic bronchitis (HCC)    "gets it q yr"  . Constipation    takes Miralax daily as needed  . Depression    takes Risperdal nightly  . Environmental allergies    takes Zyrtec daily  . GASTRIC ULCER ACUTE WITH HEMORRHAGE 07/24/2006  . GERD (gastroesophageal reflux disease)    takes Pravacid daily  . History of blood transfusion    no abnormal reaction noted  . History of hiatal hernia   . History of shingles   . History of stomach ulcers ~ 1996  . Hypercholesterolemia   . Pneumonia "quite a few times"   in 2015 and in June 2016  . Recurrent UTI (urinary tract infection)   . URINARY INCONTINENCE 02/09/2010   takes Detrol daily-occasionally incontinence  . Weakness    numbness in extremities    Patient Active Problem List   Diagnosis Date Noted  . 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 Personal history of gastric ulcer 01/20/2016  . Palliative care encounter   . 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 Cerebral palsy, quadriplegic (HCC)   . Venous stasis ulcer of ankle (HCC)   . 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 Pre-diabetes 08/31/2015  . Foot ulcer, limited to breakdown of skin (HCC) 06/08/2015  . Osteomyelitis of toe of right foot (HCC) 12/14/2014  . Constipation 11/01/2014  . Seasonal allergies 11/01/2014  . Chronic osteomyelitis (HCC)   . Colitis 10/15/2014  . Pressure ulcer of foot 03/14/2014  . 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 Pressure ulcer 01/08/2013  . 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 Medication monitoring encounter 07/10/2011  . RENAL GLYCOSURIA 02/26/2010  . URINARY INCONTINENCE 02/09/2010  . 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 Mental retardation 07/24/2006  . Infantile cerebral palsy (HCC) 07/24/2006    Past Surgical History:  Procedure Laterality Date  . AMPUTATION Right 12/14/2014   Procedure: AMPUTATION DIGIT RIGHT GREAT TOE MTP;  Surgeon: Nadara MustardMarcus Duda V, MD;  Location: [facility] OR;  Service: Orthopedics;  Laterality: Right;  . head surgery  as a baby   due to "water head"   . HERNIA REPAIR    . HIATAL HERNIA REPAIR  1982  . LEG SURGERY Bilateral ~ 1967   "legs were scissored; stretched them out"       Home Medications    Prior to  Admission medications   Medication Sig Start Date End Date Taking? Authorizing Provider  baclofen (LIORESAL) 20 MG tablet Take 1 tablet (20 mg total) by mouth 3 (three) times daily. 07/18/16   Kirsteins, Victorino Sparrow, MD  cetirizine (ZYRTEC) 10 MG tablet Take 1 tablet (10 mg total) by mouth daily. Patient taking differently: Take 10 mg by mouth at bedtime.  11/01/14   Street, Stephanie Coup, MD  Cholecalciferol (VITAMIN D3) 5000 units CAPS Take 5,000 Units by mouth daily.    [provider]  clotrimazole (LOTRIMIN) 1 % cream Apply to affected area 2 times daily 01/29/17   Liberty Handy, PA-C  diclofenac sodium (VOLTAREN) 1 % GEL  Apply 4 g topically 4 (four) times daily. 05/28/17   Kirsteins, Victorino Sparrow, MD  divalproex (DEPAKOTE SPRINKLE) 125 MG capsule Take 500 mg by mouth 2 (two) times daily.     [provider]  doxycycline (VIBRAMYCIN) 100 MG capsule Take 1 capsule (100 mg total) by mouth 2 (two) times daily. 01/29/17   Liberty Handy, PA-C  Elastic Bandages & Supports (JOBST ANTI-EM KNEE HIGH MED) MISC by Does not apply route. Apply once in the morning and remove at bedtime daily    [provider]  Foot Care Products (PREVALON HEEL PROTECTOR) MISC 1 Device by Does not apply route daily. 06/20/15   Marquette Saa, MD  gentamicin cream (GARAMYCIN) 0.1 % Apply 1 application topically 3 (three) times daily. 05/26/17   Felecia Shelling, DPM  Hydrocortisone Acetate 1 % CREA Apply 1 application topically daily. To face, every PM. 06/09/14   Street, Stephanie Coup, MD  hydrocortisone cream 1 % APPLY TOPICALLY TO FACE AT BEDTIME 08/15/17   Margit Hanks, MD  ibuprofen (ADVIL,MOTRIN) 600 MG tablet Take 1 tablet (600 mg total) by mouth every 6 (six) hours as needed. 05/17/16   Georgiana Shore, PA-C  ketoconazole (NIZORAL) 2 % cream Apply 1 application topically daily. Apply to affected areas of face,skin folds, groin, and underarms    [provider]  ketoconazole (NIZORAL) 2 % shampoo APPLY TOPICALLY TO AFFECTED AREA(S) TWICE WEEKLY AS DIRECTED 10/22/17   Beaulah Dinning, MD  LORazepam (ATIVAN) 1 MG tablet Take 1 mg by mouth every 4 (four) hours as needed for anxiety.    [provider]  nitrofurantoin, macrocrystal-monohydrate, (MACROBID) 100 MG capsule Take 1 capsule (100 mg total) by mouth 2 (two) times daily. 02/05/18   Lurline Idol, FNP  omeprazole (PRILOSEC) 20 MG capsule Take 1 capsule (20 mg total) by mouth daily. 01/24/17   Marquette Saa, MD  oxyCODONE-acetaminophen (PERCOCET/ROXICET) 5-325 MG tablet Take 1 tablet by mouth every 6 (six) hours as needed for  up to 5 days for severe pain. 02/05/18 02/10/18  Lurline Idol, FNP  Oyster Shell (OYSTER CALCIUM) 500 MG TABS tablet Take 1,500 mg of elemental calcium by mouth daily.    [provider]  risperiDONE (RISPERDAL) 2 MG tablet Take 2 mg by mouth at bedtime.     [provider]  tamsulosin (FLOMAX) 0.4 MG CAPS capsule Take 1 capsule (0.4 mg total) by mouth daily. 02/05/18   Lurline Idol, FNP  tiZANidine (ZANAFLEX) 4 MG tablet Take 2 mg by mouth 3 (three) times daily.  07/23/15   [provider]  tolterodine (DETROL LA) 2 MG 24 hr capsule Take 1 capsule (2 mg total) by mouth daily. Patient taking differently: Take 2 mg by mouth daily at 6 PM.  04/08/13   Street,  Stephanie Coup, MD  traMADol (ULTRAM) 50 MG tablet Take 1 tablet (50 mg total) by mouth every 6 (six) hours as needed for severe pain. 10/22/15   Mayo, Allyn Kenner, MD  tretinoin (RETIN-A) 0.025 % gel Apply 1 application topically at bedtime. Apply to face    [provider]  vitamin C (VITAMIN C) 250 MG tablet Take 1 tablet (250 mg total) by mouth daily. 01/18/16   Palma Holter, MD  Zinc Oxide 40 % PSTE APPLY A THIN LAYER TOPICALLY TO BUTTOCKS AND SCROTUM TWICE DAILY *BEFIN AFTER COMPLETION OF CLOTRIMAZOLE OINTMENT* 09/18/17   Dolores Patty C, DO  tolterodine (DETROL LA) 2 MG 24 hr capsule Take 1 capsule (2 mg total) by mouth daily. 07/31/10 04/08/13  Nestor Ramp, MD    Family History Family History  Adopted: Yes    Social History Social History   Tobacco Use  . Smoking status: Never Smoker  . Smokeless tobacco: Never Used  Substance Use Topics  . Alcohol use: No  . Drug use: No     Allergies   Adhesive [tape]   Review of Systems Review of Systems  Constitutional: Negative for fever.  Gastrointestinal: Negative for abdominal pain, nausea and vomiting.  Genitourinary: Positive for discharge, dysuria, flank pain, hematuria, penile pain, penile swelling, scrotal swelling and  testicular pain. Negative for frequency.  Musculoskeletal: Negative for back pain.  Neurological: Negative for dizziness and headaches.  All other systems reviewed and are negative.    Physical Exam Triage Vital Signs ED Triage Vitals [02/05/18 1807]  Enc Vitals Group     BP 126/72     Pulse Rate 83     Resp 19     Temp 98.1 F (36.7 C)     Temp Source Oral     SpO2 100 %     Weight      Height      Head Circumference      Peak Flow      Pain Score      Pain Loc      Pain Edu?      Excl. in GC?    No data found.  Updated Vital Signs BP 126/72 (BP Location: Right Arm)   Pulse 83   Temp 98.1 F (36.7 C) (Oral)   Resp 19   SpO2 100%   Visual Acuity Right Eye Distance:   Left Eye Distance:   Bilateral Distance:    Right Eye Near:   Left Eye Near:    Bilateral Near:     Physical Exam  Constitutional: He is oriented to person, place, and time. He appears well-developed and well-nourished.  Neck: Neck supple.  Cardiovascular: Normal rate and regular rhythm.  Pulmonary/Chest: Effort normal and breath sounds normal.  Abdominal: Soft. Bowel sounds are normal.  Musculoskeletal: Normal range of motion.  Neurological: He is alert and oriented to person, place, and time.  Skin: Skin is warm and dry.  Psychiatric: He has a normal mood and affect.     UC Treatments / Results  Labs (all labs ordered are listed, but only abnormal results are displayed) Labs Reviewed  POCT URINALYSIS DIP (DEVICE) - Abnormal; Notable for the following components:      Result Value   Bilirubin Urine SMALL (*)    Ketones, ur TRACE (*)    Hgb urine dipstick LARGE (*)    Protein, ur >=300 (*)    Urobilinogen, UA 2.0 (*)    Nitrite POSITIVE (*)  Leukocytes, UA SMALL (*)    All other components within normal limits    EKG None  Radiology No results found.  Procedures Procedures (including critical care time)  Medications Ordered in UC Medications  cefTRIAXone (ROCEPHIN)  injection 1 g (has no administration in time range)  ketorolac (TORADOL) injection 60 mg (has no administration in time range)    Initial Impression / Assessment and Plan / UC Course  I have reviewed the triage vital signs and the nursing notes.  Pertinent labs & imaging results that were available during my care of the patient were reviewed by me and considered in my medical decision making (see chart for details).     57 year old male with a history of recurrent UTIs/urinary incontinence presenting with dysuria, foul-smelling urine, left flank pain, suprapubic pressure and hematuria. Urine dipstick shows negative glucose, large hemoglobin, trace ketones, small leukocyte esterase, positive nitrites, positive protein and positive urobilinogen.  Patient is nontoxic-appearing.  Afebrile.  Vital signs stable.   1. Rocephin 1 g IM given and Toradol 60 mg IM in clinic 2.  Macrobid 100 mg twice daily x5 days 3.  Flomax 0.4 mg daily x14 days 4.  Percocet as needed for severe pain 5.  Urine cultures pending  6.  Patient was instructed to take medications as prescribed, drink plenty of water, strain all urine and take any collected stones to urology.  7. Advised patient to follow-up with urology next week.  8. Go to the ED immediately for pain uncontrolled with medication, uncontrollable vomiting, fevers, inability to urinate or any concerning symptom.    Today's evaluation has revealed no signs of a dangerous process. Discussed diagnosis with patient and caretaker. Patient and caretaker aware of their diagnosis, possible red flag symptoms to watch out for and need for close follow up. Patient and caretaker understands verbal and written discharge instructions. Patient and caretaker comfortable with plan and disposition.  Patient and caretaker has a clear mental status at this time, good insight into illness (after discussion and teaching) and has clear judgment to make decisions regarding their  care.  Documentation was completed with the aid of voice recognition software. Transcription may contain typographical errors. Final Clinical Impressions(s) / UC Diagnoses   Final diagnoses:  Acute cystitis with hematuria     Discharge Instructions     Take medications as prescribed. Drink plenty of water. Strain all urine. Place any stones into the collection cup. Call urology tomorrow to schedule an appointment for next week. Take any collected stones to the urology office. Go to the ED immediately for pain uncontrolled with medication, uncontrollable vomiting, fevers, inability to urinate or any concerning symptom.     ED Prescriptions    Medication Sig Dispense Auth. Provider   tamsulosin (FLOMAX) 0.4 MG CAPS capsule Take 1 capsule (0.4 mg total) by mouth daily. 14 capsule Priyal Musquiz, [place], FNP   nitrofurantoin, macrocrystal-monohydrate, (MACROBID) 100 MG capsule Take 1 capsule (100 mg total) by mouth 2 (two) times daily. 10 capsule Lurline Idol, FNP   oxyCODONE-acetaminophen (PERCOCET/ROXICET) 5-325 MG tablet Take 1 tablet by mouth every 6 (six) hours as needed for up to 5 days for severe pain. 12 tablet Lurline Idol, FNP     Controlled Substance Prescriptions [place] Controlled Substance Registry consulted? Yes, I have consulted the  Controlled Substances Registry for this patient, and feel the risk/benefit ratio today is favorable for proceeding with this prescription for a controlled substance.   Lurline Idol, FNP 02/05/18 1858    Cathlean Sauer,  Lelon Mast, FNP 02/05/18 1902

## 2018-02-05 NOTE — ED Triage Notes (Signed)
Pt presents with urinary tract symptoms; frequent urination, burning with urination, foul smell,and discoloration.

## 2018-02-05 NOTE — Discharge Instructions (Signed)
Take medications as prescribed. Drink plenty of water. Strain all urine. Place any stones into the collection cup. Call urology tomorrow to schedule an appointment for next week. Take any collected stones to the urology office. Go to the ED immediately for pain uncontrolled with medication, uncontrollable vomiting, fevers, inability to urinate or any concerning symptom.

## 2018-02-07 LAB — URINE CULTURE: SPECIAL REQUESTS: NORMAL

## 2018-02-12 DIAGNOSIS — L97511 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 breakdown of skin: Secondary | ICD-10-CM | POA: Diagnosis not present

## 2018-02-18 ENCOUNTER — Other Ambulatory Visit: Payer: Self-pay | Admitting: Urology

## 2018-02-18 DIAGNOSIS — N200 Calculus of kidney: Secondary | ICD-10-CM

## 2018-02-25 ENCOUNTER — Other Ambulatory Visit: Payer: Self-pay | Admitting: Physical Medicine & Rehabilitation

## 2018-03-02 ENCOUNTER — Encounter (HOSPITAL_BASED_OUTPATIENT_CLINIC_OR_DEPARTMENT_OTHER): Payer: Medicare Other | Attending: Internal Medicine

## 2018-03-02 DIAGNOSIS — G629 Polyneuropathy, unspecified: Secondary | ICD-10-CM | POA: Diagnosis not present

## 2018-03-02 DIAGNOSIS — L97511 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 breakdown of skin: Secondary | ICD-10-CM | POA: Diagnosis present

## 2018-03-02 DIAGNOSIS — Z86718 Personal history of other venous thrombosis and embolism: Secondary | ICD-10-CM | POA: Diagnosis not present

## 2018-03-03 ENCOUNTER — Ambulatory Visit (HOSPITAL_COMMUNITY)
Admission: RE | Admit: 2018-03-03 | Discharge: 2018-03-03 | Disposition: A | Discharge status: Home / Self Care | Payer: Medicare Other | Source: Ambulatory Visit | Attending: Urology | Admitting: Urology

## 2018-03-03 DIAGNOSIS — K449 Diaphragmatic hernia without obstruction or gangrene: Secondary | ICD-10-CM | POA: Insufficient documentation

## 2018-03-03 DIAGNOSIS — J90 Pleural effusion, not elsewhere classified: Secondary | ICD-10-CM | POA: Insufficient documentation

## 2018-03-03 DIAGNOSIS — N200 Calculus of kidney: Secondary | ICD-10-CM | POA: Insufficient documentation

## 2018-03-03 DIAGNOSIS — K8020 Calculus of gallbladder without cholecystitis without obstruction: Secondary | ICD-10-CM | POA: Diagnosis not present

## 2018-03-16 DIAGNOSIS — L97511 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 breakdown of skin: Secondary | ICD-10-CM | POA: Diagnosis not present

## 2018-03-19 IMAGING — CR DG FOOT COMPLETE 3+V*L*
4 series · 4 of 4 positions shown · non-contrast
Comparison: None.

CLINICAL DATA: Ulceration of the medial ankle. Assess for
osteomyelitis.

EXAM:
LEFT FOOT - COMPLETE 3+ VIEW

[x foot lat left (1 of 3)]
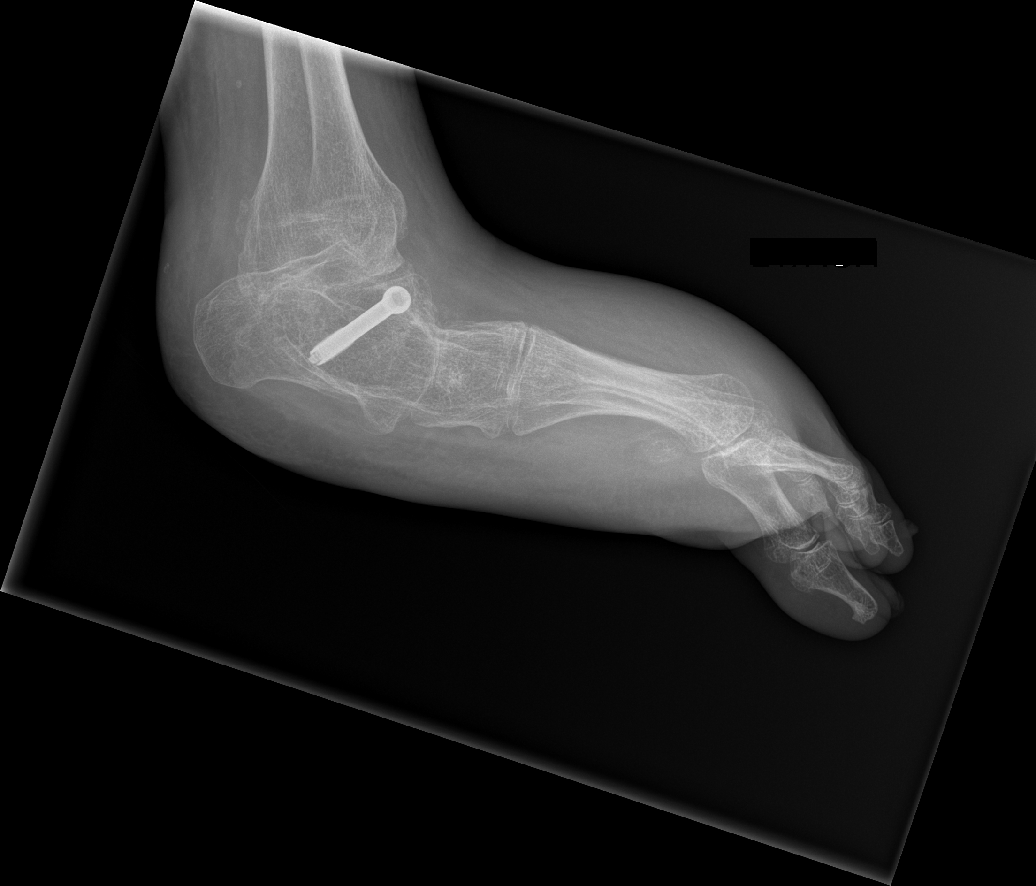

[x foot lat left (2 of 3)]
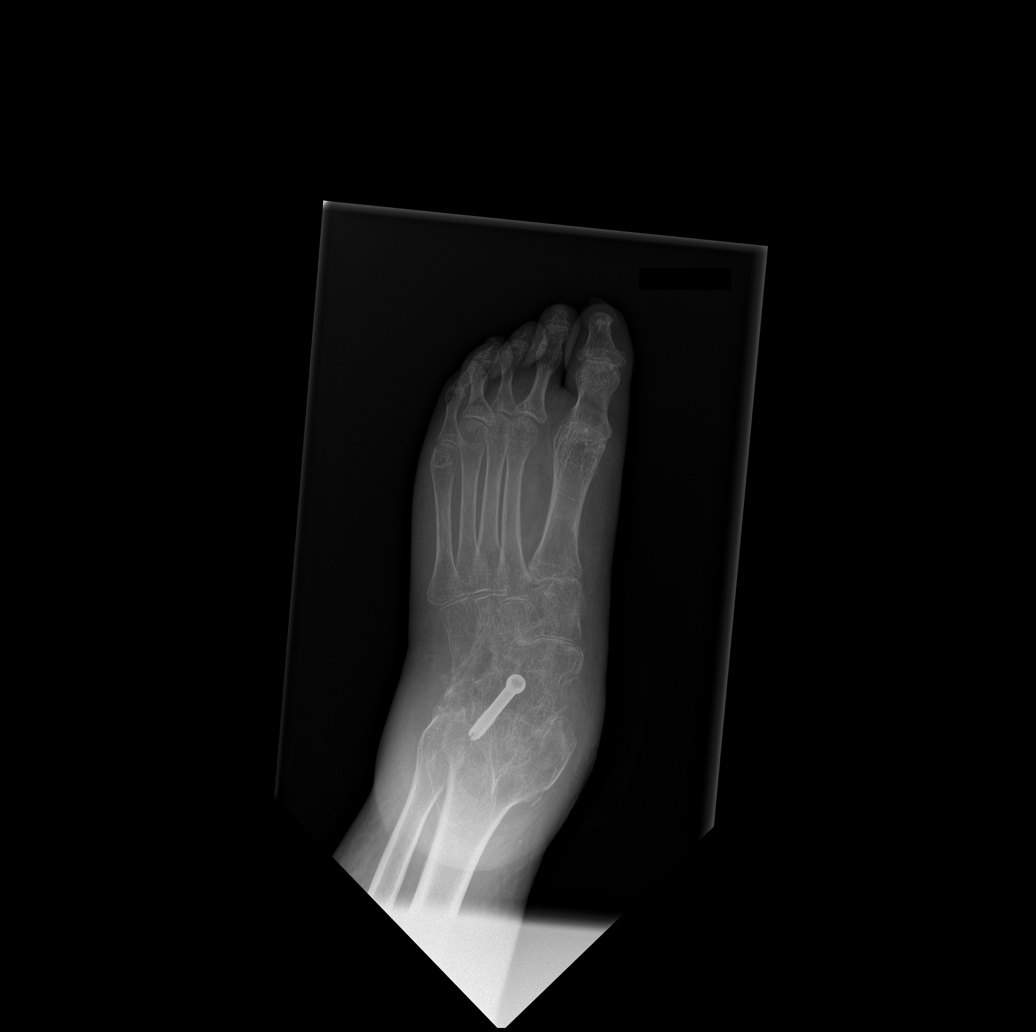

[x foot lat left (3 of 3)]
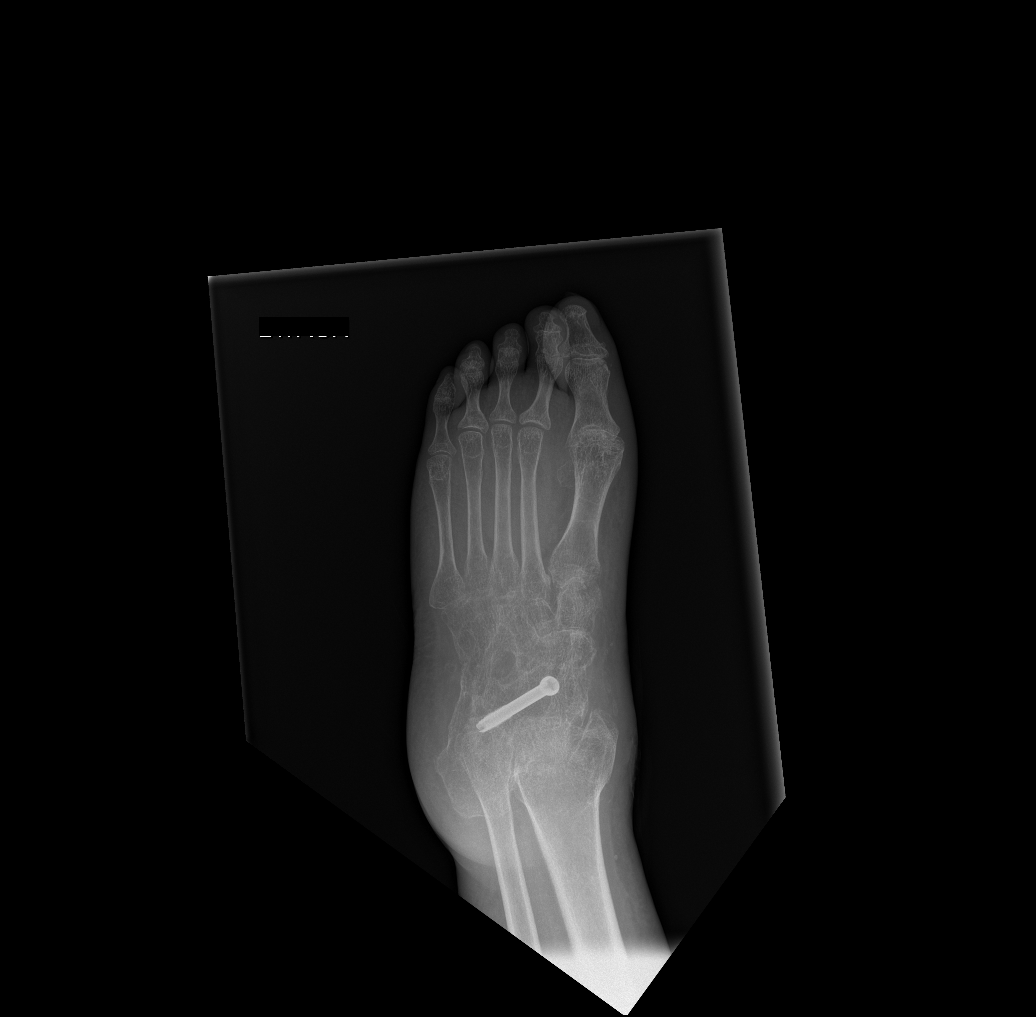

[x foot ap left]
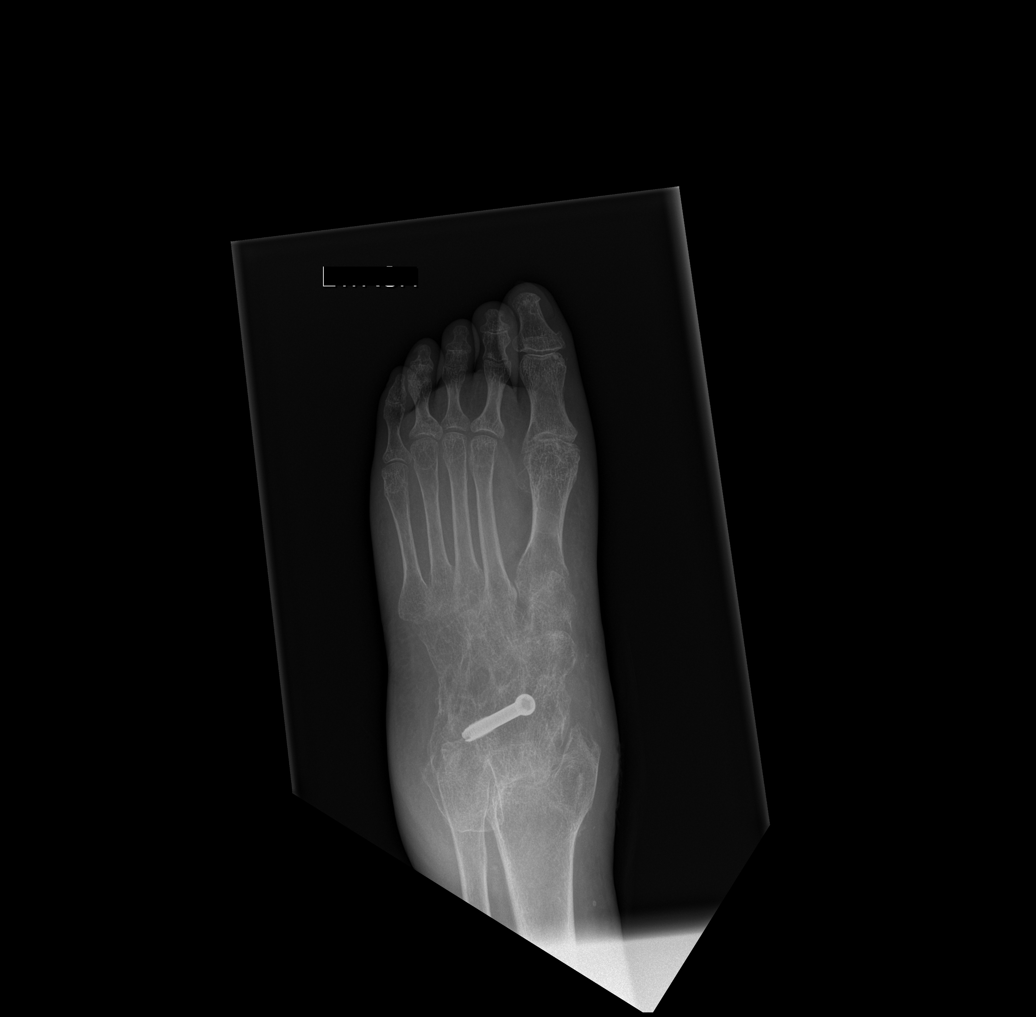

[4 of 4 positions shown; findings below may reference images not displayed]

FINDINGS: The bones are markedly osteopenic. There is chronic arthrodesis of
the midfoot and hindfoot with a single screw in place. There is
pronounced soft tissue swelling of the foot. I do not see any lytic
destructive change that would allow that diagnosis of osteomyelitis
by radiography.
IMPRESSION: Previous midfoot and hindfoot arthrodesis. Pronounced soft tissue
swelling of the foot. No destructive or lytic bone change seen to
allow diagnosis of osteomyelitis by radiography.

## 2018-03-30 ENCOUNTER — Encounter (HOSPITAL_BASED_OUTPATIENT_CLINIC_OR_DEPARTMENT_OTHER): Payer: Medicare Other | Attending: Internal Medicine

## 2018-03-30 ENCOUNTER — Encounter (HOSPITAL_BASED_OUTPATIENT_CLINIC_OR_DEPARTMENT_OTHER): Payer: Self-pay

## 2018-03-30 DIAGNOSIS — G8250 Quadriplegia, unspecified: Secondary | ICD-10-CM | POA: Insufficient documentation

## 2018-03-30 DIAGNOSIS — Z86718 Personal history of other venous thrombosis and embolism: Secondary | ICD-10-CM | POA: Diagnosis not present

## 2018-03-30 DIAGNOSIS — G629 Polyneuropathy, unspecified: Secondary | ICD-10-CM | POA: Diagnosis not present

## 2018-03-30 DIAGNOSIS — L97511 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 breakdown of skin: Secondary | ICD-10-CM | POA: Insufficient documentation

## 2018-04-05 NOTE — Progress Notes (Signed)
Subjective:   Patient ID: [name]    DOB: 11/13/1960, 57 y.o. male   MRN: [id number]  YAACOV [name] is a 57 y.o. male with a history of cerebral palsy and quadriplegia and mental status intact here for follow-up of CT scan done at urologist.   CT results indicated no evidence of urolithiasis or hydronephrosis, moderate hiatal hernia showing gastric wall thickening, suspicious for gastritis. Recommend endoscopy for further evaluation. And a large amount of colonic stool, especially within the rectum, suspicious for fecal impaction. CT also reveal cholelithiasis without evidence of cholecystitis.   Patient is primarily concerned about the hiatal hernia and colonic stool.  History of Hiatial Hernia: Patient notes history of hiatal hernia repaired in 1982. Patient notes after he eats he feels nauseated with certain foods. He experiences pain when he coughs at upper epigastric area.  Patient notes that with his previous hiatal hernia he experienced vomiting. Per chart review, this hiatal hernia has been noted on previous imaging since 2014. Patient is currently on Prilosec 20mg  QD.  Large Colonic Stool: Caregiver and patient notes bowel movements are very hard and painful. He is able to have a bowel movement every day. Patient took miralax and senna in the past and had good results. Caregiver requests something in pill form if possible.   History of Cerumen Impaction: Decreased hearing from left ear. Has had to have disimpaction with irrigation in the past due to impaction.   Health Maintenance: Due for Flu and Colonoscopy. Interested in FOBT.  Review of Systems:  Per HPI.   PMFSH, medications and smoking status reviewed.  Objective:   BP 100/62   Pulse 78   Temp 98.5 F (36.9 C) (Oral)   SpO2 98%  Vitals and nursing note reviewed.  General: well nourished, well developed, in no acute distress with non-toxic appearance, in wheelchair  HEENT: normocephalic, atraumatic, moist  mucous membranes, significant cerumen impaction on the left.  Neck: supple, non-tender without lymphadenopathy CV: regular rate and rhythm without murmurs, rubs, or gallops Lungs: clear to auscultation bilaterally with normal work of breathing Abdomen: soft, tender to palpation along left upper and lower abdomen, some tenderness along epigastric region as well. non-tender, normoactive bowel sounds Extremities: warm and well perfused MSK: Quadriplegic in wheelchair. Significant contractures of UE and LE, more prominent on the right. Leaning to the right in chair despite chest harness and pillow.  Neuro: Alert and oriented  Assessment & Plan:   Hiatal hernia  CT scan 03/03/18: Moderate hiatal hernia with gastric wall thickening, suspicious for gastritis.  Prior imaging since 2014 indicates this hernia same hernia, appears stable.   Patient endorses only mild symptoms, although currently on Omeprazole 20mg  QD.   Per CT scan, endoscopy was recommended.   As patient has anemia noted on previous CBC (11.3) in January 2019, will refer to GI for further evaluation and consideration of further imaging. Anemia could be secondary to chronic disease, however with gastritis noted on CT scan, believe further evaluation is warranted.  Will hold off on colonoscopy/FOBT at this time pending GI recommendations    Patient to follow-up in 6-8 weeks   Constipation  Large amount of colonic stool noted on CT scan in addition to constipation endorsed by patient.  Patient has taken Miralax and Senna in past but was discontinued as he was not taking it.  Per patient and care-giver request for a pill option, will start patient on Colace 100mg  BID.   Cerumen impaction  Cerumen  impaction noted on the left, right clear with TM visible  Irrigation unsuccessful  Carbamide Peroxide (Deprox) solution ordered to help break up cerumen.   Can attempt repeat irrigation if still compacted after trail of Debrox.  Patient to follow-up in 6-8 weeks.  Health Maintenance:  Patient received flu vaccine today  Will defer colonoscopy/FOBT at this time pending GI recommendations  Follow up: Patient to follow-up in 6-8 weeks to go over medication list/problem list and follow-up on left ear, constipation, and GI referral  Orders Placed This Encounter  Procedures  . Flu Vaccine QUAD 36+ mos IM  . Ambulatory referral to Gastroenterology    Referral Priority:   Routine    Referral Type:   Consultation    Referral Reason:   Specialty Services Required    Number of Visits Requested:   1   Meds ordered this encounter  Medications  . docusate sodium (COLACE) 100 MG capsule    Sig: Take 1 capsule (100 mg total) by mouth 2 (two) times daily.    Dispense:  60 capsule    Refill:  2  . carbamide peroxide (DEBROX) 6.5 % OTIC solution    Sig: Place 5 drops into the left ear 2 (two) times daily.    Dispense:  15 mL    Refill:  0   [name] Cobb, DO PGY-1, [facility] 04/07/2018 5:49 PM

## 2018-04-07 ENCOUNTER — Encounter: Payer: Self-pay | Admitting: Family Medicine

## 2018-04-07 ENCOUNTER — Other Ambulatory Visit: Payer: Self-pay

## 2018-04-07 ENCOUNTER — Ambulatory Visit (INDEPENDENT_AMBULATORY_CARE_PROVIDER_SITE_OTHER): Payer: Medicare Other | Admitting: Family Medicine

## 2018-04-07 VITALS — BP 100/62 | HR 78 | Temp 98.5°F

## 2018-04-07 DIAGNOSIS — K449 Diaphragmatic hernia without obstruction or gangrene: Secondary | ICD-10-CM | POA: Diagnosis not present

## 2018-04-07 DIAGNOSIS — H6122 Impacted cerumen, left ear: Secondary | ICD-10-CM | POA: Diagnosis not present

## 2018-04-07 DIAGNOSIS — Z23 Encounter for immunization: Secondary | ICD-10-CM

## 2018-04-07 DIAGNOSIS — K5909 Other constipation: Secondary | ICD-10-CM

## 2018-04-07 MED ORDER — CARBAMIDE PEROXIDE 6.5 % OT SOLN
5.0000 [drp] | Freq: Two times a day (BID) | OTIC | 0 refills | Status: DC
Start: 2018-04-07 — End: 2018-04-21

## 2018-04-07 MED ORDER — DOCUSATE SODIUM 100 MG PO CAPS: 100.0000 mg | ORAL_CAPSULE | Freq: Two times a day (BID) | ORAL | 2 refills | Status: DC

## 2018-04-07 NOTE — Assessment & Plan Note (Addendum)
   CT scan 03/03/18: Moderate hiatal hernia with gastric wall thickening, suspicious for gastritis.  Prior imaging since 2014 indicates this hernia same hernia, appears stable.   Patient endorses only mild symptoms, although currently on Omeprazole 20mg  QD.   Per CT scan, endoscopy was recommended.   As patient has anemia noted on previous CBC (11.3) in January 2019, will refer to GI for further evaluation and consideration of further imaging. Anemia could be secondary to chronic disease, however with gastritis noted on CT scan, believe further evaluation is warranted.  Will hold off on colonoscopy/FOBT at this time pending GI recommendations    Patient to follow-up in 6-8 weeks

## 2018-04-07 NOTE — Patient Instructions (Signed)
Thank you for coming to see me today. It was a pleasure. Today we talked about:   Hiatal Hernia: I am referring you to GI for further evaluation.   Constipation: I Have prescribed you Colace. Please take 1 pill twice a day.  Cerumen Impaction: We flushed your left ear.  You also received your flu shot today.   Please follow-up with me (PCP) in 6-8 weeks.  If you have any questions or concerns, please do not hesitate to call the office at [phone].  Take Care,   Dr. Orpah CobbKiersten Mullis, DO Resident Physician [facility] [phone]

## 2018-04-07 NOTE — Assessment & Plan Note (Addendum)
   Cerumen impaction noted on the left, right clear with TM visible  Irrigation unsuccessful  Carbamide Peroxide (Deprox) solution ordered to help break up cerumen.   Can attempt repeat irrigation if still compacted after trail of Debrox. Patient to follow-up in 6-8 weeks.

## 2018-04-07 NOTE — Assessment & Plan Note (Signed)
   Large amount of colonic stool noted on CT scan in addition to constipation endorsed by patient.  Patient has taken Miralax and Senna in past but was discontinued as he was not taking it.  Per patient and care-giver request for a pill option, will start patient on Colace 100mg  BID.

## 2018-04-08 NOTE — Addendum Note (Signed)
Addended by: Joana ReamerMULLIS, KIERSTEN P on: 04/08/2018 01:26 PM   Modules accepted: Orders

## 2018-04-13 DIAGNOSIS — L97511 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 breakdown of skin: Secondary | ICD-10-CM | POA: Diagnosis not present

## 2018-04-14 ENCOUNTER — Encounter: Payer: Self-pay | Admitting: Gastroenterology

## 2018-04-21 ENCOUNTER — Other Ambulatory Visit: Payer: Self-pay | Admitting: Family Medicine

## 2018-04-21 DIAGNOSIS — H6122 Impacted cerumen, left ear: Secondary | ICD-10-CM

## 2018-04-27 ENCOUNTER — Encounter (HOSPITAL_BASED_OUTPATIENT_CLINIC_OR_DEPARTMENT_OTHER): Payer: Medicare Other

## 2018-05-01 ENCOUNTER — Encounter (HOSPITAL_BASED_OUTPATIENT_CLINIC_OR_DEPARTMENT_OTHER): Payer: Medicare Other | Attending: Internal Medicine

## 2018-05-01 DIAGNOSIS — L89893 Pressure ulcer of other site, stage 3: Secondary | ICD-10-CM | POA: Insufficient documentation

## 2018-05-01 DIAGNOSIS — G8250 Quadriplegia, unspecified: Secondary | ICD-10-CM | POA: Diagnosis not present

## 2018-05-01 DIAGNOSIS — L97312 Non-pressure chronic ulcer of right 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: Secondary | ICD-10-CM | POA: Insufficient documentation

## 2018-05-01 DIAGNOSIS — Z86718 Personal history of other venous thrombosis and embolism: Secondary | ICD-10-CM | POA: Diagnosis not present

## 2018-05-07 ENCOUNTER — Ambulatory Visit (INDEPENDENT_AMBULATORY_CARE_PROVIDER_SITE_OTHER): Payer: Medicare Other | Admitting: Gastroenterology

## 2018-05-07 ENCOUNTER — Encounter: Payer: Self-pay | Admitting: Gastroenterology

## 2018-05-07 VITALS — BP 126/72 | HR 64

## 2018-05-07 DIAGNOSIS — G8250 Quadriplegia, unspecified: Secondary | ICD-10-CM

## 2018-05-07 DIAGNOSIS — R9389 Abnormal findings on diagnostic imaging of other specified body structures: Secondary | ICD-10-CM | POA: Diagnosis not present

## 2018-05-07 DIAGNOSIS — K449 Diaphragmatic hernia without obstruction or gangrene: Secondary | ICD-10-CM | POA: Diagnosis not present

## 2018-05-07 DIAGNOSIS — Z1211 Encounter for screening for malignant neoplasm of colon: Secondary | ICD-10-CM

## 2018-05-07 MED ORDER — OMEPRAZOLE 40 MG PO CPDR: 40.0000 mg | DELAYED_RELEASE_CAPSULE | Freq: Every day | ORAL | 3 refills | Status: DC

## 2018-05-07 NOTE — Progress Notes (Signed)
Referring Provider: Joana Reamer, DO Primary Care Physician:  Joana Reamer, DO   Reason for Consultation: Hiatal hernia   IMPRESSION:  Abnormal CT scan: gastric wall thickening Hiatal hernia with intermittent nausea Significant stool burden on CT scan    - currently on colace Quadraplegia No prior colon cancer screening  Colonoscopy recommended for screening and to exclude polyp or mass. I recommended the addition of Metamucil, Benefiber, or Citrucel to his current bowel regimen. Could consider trial of Amitiza in the future.   Provided reassurance about the hiatal hernia. Will increase omeprazole to 40 mg daily. EGD to evaluate the gastric wall thickening seen on CT to exclude infection, inflammation, and malignancy.  PLAN: Increase omeprazole to 40 mg daily Add daily Metamucil, Benefiber, or Citrucel Colonoscopy and EGD at the hospital Will need to be admitted for the bowel prep the day prior to endoscopy given his quadraplegia   HPI: [name] is a 57 y.o. male seen in consultation at the request of Dr. Mauri Reading for further evaluation of a hiatal hernia.  He has cerebral palsy and quadriplegia.  The history is obtained through the patient and review of his electronic health record. His caregiver accompanies him.   A CT of the abdomen and pelvis without contrast 03/03/2018 obtained during the evaluation for kidney stones showed a mild hiatal hernia the radiologist mention the hiatal hernia shows gastric wall thickening suspicious for gastritis.  A large amount of stool was seen throughout the colon with concerns for rectal fecal impaction.  History of hiatal hernia repaired in 1982 when he had significant emesis related to the hernia. Has intermittent nausea.  Noted on imaging back to 2014. Currently on omeprazole 20 mg daily. Has breakthrough symptoms. No other associated symptoms. No identified exacerbating or relieving features.   Longstanding constipation. He  has had hard and painful bowel movements. Good response to Miralax and senna in the past. He is currently on docusate sodium 100 mg BID.    No prior colon cancer screening. No prior colonoscopy.   Past Medical History:  Diagnosis Date  . Anxiety    takes Ativan daily as needed  . Bipolar disorder (HCC)    takes Depakote daily  . CEREBRAL PALSY 07/24/2006  . Chronic bronchitis (HCC)    "gets it q yr"  . Constipation    takes Miralax daily as needed  . Depression    takes Risperdal nightly  . Environmental allergies    takes Zyrtec daily  . GASTRIC ULCER ACUTE WITH HEMORRHAGE 07/24/2006  . GERD (gastroesophageal reflux disease)    takes Pravacid daily  . History of blood transfusion    no abnormal reaction noted  . History of hiatal hernia   . History of shingles   . History of stomach ulcers ~ 1996  . Hypercholesterolemia   . Pneumonia "quite a few times"   in 2015 and in June 2016  . Recurrent UTI (urinary tract infection)   . URINARY INCONTINENCE 02/09/2010   takes Detrol daily-occasionally incontinence  . Weakness    numbness in extremities    Past Surgical History:  Procedure Laterality Date  . AMPUTATION Right 12/14/2014   Procedure: AMPUTATION DIGIT RIGHT GREAT TOE MTP;  Surgeon: Nadara Mustard, MD;  Location: [facility] OR;  Service: Orthopedics;  Laterality: Right;  . head surgery  as a baby   due to "water head"   . HERNIA REPAIR    . HIATAL HERNIA REPAIR  1982  .  LEG SURGERY Bilateral ~ 1967   "legs were scissored; stretched them out"    Current Outpatient Medications  Medication Sig Dispense Refill  . baclofen (LIORESAL) 20 MG tablet Take 1 tablet (20 mg total) by mouth 3 (three) times daily. 90 each 1  . cetirizine (ZYRTEC) 10 MG tablet Take 1 tablet (10 mg total) by mouth daily. (Patient taking differently: Take 10 mg by mouth at bedtime. ) 30 tablet 3  . Cholecalciferol (VITAMIN D3) 5000 units CAPS Take 5,000 Units by mouth daily.    . divalproex (DEPAKOTE  SPRINKLE) 125 MG capsule Take 500 mg by mouth 2 (two) times daily.     Marland Kitchen. docusate sodium (COLACE) 100 MG capsule Take 1 capsule (100 mg total) by mouth 2 (two) times daily. 60 capsule 2  . EAR DROPS EARWAX AID 6.5 % OTIC solution PLACE 5 DROPS INTO THE LEFT EAR TWICE DAILY 15 mL 4  . Elastic Bandages & Supports (JOBST ANTI-EM KNEE HIGH MED) MISC by Does not apply route. Apply once in the morning and remove at bedtime daily    . Foot Care Products (PREVALON HEEL PROTECTOR) MISC 1 Device by Does not apply route daily. 2 each 0  . guaifenesin (ROBITUSSIN) 100 MG/5ML syrup Take 200 mg by mouth as needed for cough.    . hydrocortisone cream 1 % APPLY TOPICALLY TO FACE AT BEDTIME 28.4 g 4  . ketoconazole (NIZORAL) 2 % shampoo APPLY TOPICALLY TO AFFECTED AREA(S) TWICE WEEKLY AS DIRECTED 120 mL 4  . LORazepam (ATIVAN) 1 MG tablet Take 1 mg by mouth every 4 (four) hours as needed for anxiety.    . magnesium hydroxide (MILK OF MAGNESIA) 400 MG/5ML suspension Take by mouth daily as needed for constipation.    Ethelda Chick. Oyster Shell (OYSTER CALCIUM) 500 MG TABS tablet Take 1,500 mg of elemental calcium by mouth daily.    . risperiDONE (RISPERDAL) 2 MG tablet Take 2 mg by mouth at bedtime.     . tamsulosin (FLOMAX) 0.4 MG CAPS capsule Take 1 capsule (0.4 mg total) by mouth daily. 14 capsule 0  . tiZANidine (ZANAFLEX) 4 MG tablet Take 2 mg by mouth 3 (three) times daily.     Marland Kitchen. tolterodine (DETROL LA) 2 MG 24 hr capsule Take 1 capsule (2 mg total) by mouth daily. (Patient taking differently: Take 2 mg by mouth daily at 6 PM. ) 90 capsule 3  . tretinoin (RETIN-A) 0.025 % gel Apply 1 application topically at bedtime. Apply to face    . vitamin C (VITAMIN C) 250 MG tablet Take 1 tablet (250 mg total) by mouth daily. 30 tablet 0  . Zinc Oxide 40 % PSTE APPLY A THIN LAYER TOPICALLY TO BUTTOCKS AND SCROTUM TWICE DAILY *BEFIN AFTER COMPLETION OF CLOTRIMAZOLE OINTMENT* 113 g 4  . omeprazole (PRILOSEC) 40 MG capsule Take 1  capsule (40 mg total) by mouth daily. 90 capsule 3   No current facility-administered medications for this visit.     Allergies as of 05/07/2018 - Review Complete 05/07/2018  Allergen Reaction Noted  . Adhesive [tape] Rash 12/21/2010    Family History  Adopted: Yes    Social History   Socioeconomic History  . Marital status: Single    Spouse name: Not on file  . Number of children: Not on file  . Years of education: Not on file  . Highest education level: Not on file  Occupational History  . Not on file  Social Needs  . Financial resource strain:  Not on file  . Food insecurity:    Worry: Not on file    Inability: Not on file  . Transportation needs:    Medical: Not on file    Non-medical: Not on file  Tobacco Use  . Smoking status: Never Smoker  . Smokeless tobacco: Never Used  Substance and Sexual Activity  . Alcohol use: No  . Drug use: No  . Sexual activity: Never  Lifestyle  . Physical activity:    Days per week: Not on file    Minutes per session: Not on file  . Stress: Not on file  Relationships  . Social connections:    Talks on phone: Not on file    Gets together: Not on file    Attends religious service: Not on file    Active member of club or organization: Not on file    Attends meetings of clubs or organizations: Not on file    Relationship status: Not on file  . Intimate partner violence:    Fear of current or ex partner: Not on file    Emotionally abused: Not on file    Physically abused: Not on file    Forced sexual activity: Not on file  Other Topics Concern  . Not on file  Social History Narrative  . Not on file    Review of Systems: 12 system ROS is negative except as noted above.  Filed Weights    Physical Exam: Vital signs were reviewed. General:   Alert, well-nourished, pleasant and cooperative in NAD, sitting in a wheelchair Head:  Normocephalic and atraumatic. Eyes:  Sclera clear, no icterus.   Conjunctiva pink. Mouth:  No  deformity or lesions.   Neck:  Supple; no thyromegaly. Lungs:  Clear throughout to auscultation.   No wheezes.  Heart:  Regular rate and rhythm; no murmurs Abdomen:  Soft, nontender, normal bowel sounds. No rebound or guarding. No hepatosplenomegaly Rectal:  Deferred  Msk: Significant contractures of the upper extremities and lower extremities.  Sitting in a wheelchair.  Extremities:  No gross deformities or edema. Neurologic:  Alert and  oriented x4 Skin:  No rash or bruise. Psych:  Alert and cooperative. Normal mood and affect.   Bohdan Macho L. Orvan Falconer, MD, MPH [facility] 05/07/2018, 12:46 PM

## 2018-05-07 NOTE — Patient Instructions (Signed)
You will be admitted into the hospital on 06-21-2018 and your procedure will be on 06-22-2018.   Increase Omeprazole to 40 mg daily.

## 2018-05-14 DIAGNOSIS — L89893 Pressure ulcer of other site, stage 3: Secondary | ICD-10-CM | POA: Diagnosis not present

## 2018-05-18 ENCOUNTER — Ambulatory Visit: Payer: Medicare Other | Admitting: Podiatry

## 2018-05-28 ENCOUNTER — Encounter (HOSPITAL_BASED_OUTPATIENT_CLINIC_OR_DEPARTMENT_OTHER): Payer: Medicare Other | Attending: Internal Medicine

## 2018-05-28 DIAGNOSIS — L97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 layer exposed: Secondary | ICD-10-CM | POA: Insufficient documentation

## 2018-05-28 DIAGNOSIS — R532 Functional quadriplegia: Secondary | ICD-10-CM | POA: Insufficient documentation

## 2018-05-28 DIAGNOSIS — Z86718 Personal history of other venous thrombosis and embolism: Secondary | ICD-10-CM | POA: Insufficient documentation

## 2018-05-28 DIAGNOSIS — L97322 Non-pressure chronic ulcer of left 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: Secondary | ICD-10-CM | POA: Insufficient documentation

## 2018-05-28 DIAGNOSIS — G629 Polyneuropathy, unspecified: Secondary | ICD-10-CM | POA: Insufficient documentation

## 2018-05-31 NOTE — Progress Notes (Signed)
   Subjective:   Patient ID: [name]    DOB: 1961/03/03, 58 y.o. male   MRN: [id number]  [name] is a 58 y.o. male with a history of quadraplegia here for follow-up after GI appointment.  Concerns today: None  Hiatal Hernia/Constipation/Gastric Wall Thickening: Patient saw Dr. Orvan Falconer at [facility] concerning most recent CT scan results including gastric wall thickening, hiatal hernia with intermittent nausea, and constipation.  Hiatal Hernia: Patient's omeprazole was increased to 40mg  QD.  Constipation: Currently on Colace. Was recommended to add daily Metamucil, Benefiber, or Citrucel. Patient currently just using Colace and that is helping well. Patient notes improvement in nausea with increase in omeprazole.  Gastric wall thickening: Patient has colonoscopy and EGD scheduled for 1/27.  [facility] FL[facility] forms brought over to be filled out and signed.  Review of Systems:  Per HPI.   PMFSH, medications and smoking status reviewed.  Objective:   BP 110/70   Pulse 89   Temp 97.6 F (36.4 C) (Oral)   SpO2 99%  Vitals and nursing note reviewed.  General: well nourished, well developed, in no acute distress with non-toxic appearance, sitting comfortably in wheel chair, pleasant during exam HEENT: normocephalic, atraumatic, moist mucous membranes, oropharynx clear without erythema or exudate, right ear normal appearing with good cone of light, left ear with cerumen present blocking view of tympanic membrane, but impaction improved from prior Neck: supple, non-tender without lymphadenopathy CV: regular rate and rhythm without murmurs, rubs, or gallops Lungs: clear to auscultation bilaterally with normal work of breathing Abdomen: soft, non-tender, non-distended, normoactive bowel sounds Skin: warm, dry, no rashes or lesions Extremities: warm and well perfused MSK: Quadriplegic in wheelchair. Significant contractures of UE and LE  Assessment & Plan:   Hiatal  hernia Stable, nausea improved on increased dose of Omeprazole Colonoscopy/Endoscopy scheduled 06/22/18, will follow-up   Constipation Improved, currently well managed with Colace  Can consider adding  daily Metamucil, Benefiber, or Citrucel if worsens   Encounter for completion of form with patient FL2 Forms filled out  History of Bipolar Disorder: Currently on Depakote  Will evaluate liver and kidney function (CBC, CMP)  Orders Placed This Encounter  Procedures  . CBC  . Comprehensive metabolic panel  . Lipid Panel   Orpah Cobb, DO PGY-1, [facility] 06/04/2018 5:44 PM

## 2018-06-04 ENCOUNTER — Ambulatory Visit (INDEPENDENT_AMBULATORY_CARE_PROVIDER_SITE_OTHER): Payer: Medicare Other | Admitting: Family Medicine

## 2018-06-04 ENCOUNTER — Encounter: Payer: Self-pay | Admitting: Family Medicine

## 2018-06-04 ENCOUNTER — Other Ambulatory Visit: Payer: Self-pay

## 2018-06-04 VITALS — BP 110/70 | HR 89 | Temp 97.6°F

## 2018-06-04 DIAGNOSIS — K449 Diaphragmatic hernia without obstruction or gangrene: Secondary | ICD-10-CM | POA: Diagnosis not present

## 2018-06-04 DIAGNOSIS — Z5181 Encounter for therapeutic drug level monitoring: Secondary | ICD-10-CM

## 2018-06-04 DIAGNOSIS — H6122 Impacted cerumen, left ear: Secondary | ICD-10-CM | POA: Diagnosis not present

## 2018-06-04 DIAGNOSIS — Z0289 Encounter for other administrative examinations: Secondary | ICD-10-CM | POA: Diagnosis not present

## 2018-06-04 DIAGNOSIS — K5901 Slow transit constipation: Secondary | ICD-10-CM | POA: Diagnosis not present

## 2018-06-04 DIAGNOSIS — Z13220 Encounter for screening for lipoid disorders: Secondary | ICD-10-CM | POA: Diagnosis not present

## 2018-06-04 NOTE — Assessment & Plan Note (Signed)
Improved, currently well managed with Colace  Can consider adding  daily Metamucil, Benefiber, or Citrucel if worsens

## 2018-06-04 NOTE — Patient Instructions (Addendum)
Thanks for coming to see me today. Please let me know if there is anything else you need. Don't forget about your colonoscopy scheduled for 06/22/18. Have a great day!  Dr. Orpah Cobb, DO Resident Physician [facility] [phone]

## 2018-06-04 NOTE — Assessment & Plan Note (Addendum)
Stable, nausea improved on increased dose of Omeprazole Colonoscopy/Endoscopy scheduled 06/22/18, will follow-up

## 2018-06-04 NOTE — Assessment & Plan Note (Signed)
FL2 Forms filled out

## 2018-06-05 LAB — CBC
HEMATOCRIT: 38 % (ref 37.5–51.0)
HEMOGLOBIN: 12 g/dL — AB (ref 13.0–17.7)
MCH: 24 pg — AB (ref 26.6–33.0)
MCHC: 31.6 g/dL (ref 31.5–35.7)
MCV: 76 fL — AB (ref 79–97)
Platelets: 438 10*3/uL (ref 150–450)
RBC: 4.99 x10E6/uL (ref 4.14–5.80)
RDW: 16.5 % — ABNORMAL HIGH (ref 11.6–15.4)
WBC: 8.8 10*3/uL (ref 3.4–10.8)

## 2018-06-05 LAB — LIPID PANEL
Chol/HDL Ratio: 2.8 ratio (ref 0.0–5.0)
Cholesterol, Total: 169 mg/dL (ref 100–199)
HDL: 61 mg/dL (ref >39)
LDL Calculated: 94 mg/dL (ref 0–99)
Triglycerides: 68 mg/dL (ref 0–149)
VLDL CHOLESTEROL CAL: 14 mg/dL (ref 5–40)

## 2018-06-05 LAB — COMPREHENSIVE METABOLIC PANEL
A/G RATIO: 1.2 (ref 1.2–2.2)
ALBUMIN: 3.7 g/dL (ref 3.5–5.5)
ALK PHOS: 174 IU/L — AB (ref 39–117)
ALT: 31 IU/L (ref 0–44)
AST: 23 IU/L (ref 0–40)
BUN / CREAT RATIO: 28 — AB (ref 9–20)
BUN: 16 mg/dL (ref 6–24)
CHLORIDE: 103 mmol/L (ref 96–106)
CO2: 22 mmol/L (ref 20–29)
Calcium: 9.8 mg/dL (ref 8.7–10.2)
Creatinine, Ser: 0.57 mg/dL — ABNORMAL LOW (ref 0.76–1.27)
GFR calc non Af Amer: 113 mL/min/{1.73_m2} (ref >59)
GFR, EST AFRICAN AMERICAN: 131 mL/min/{1.73_m2} (ref >59)
GLOBULIN, TOTAL: 3.2 g/dL (ref 1.5–4.5)
Glucose: 59 mg/dL — ABNORMAL LOW (ref 65–99)
Potassium: 4.4 mmol/L (ref 3.5–5.2)
SODIUM: 139 mmol/L (ref 134–144)
TOTAL PROTEIN: 6.9 g/dL (ref 6.0–8.5)

## 2018-06-09 DIAGNOSIS — Z86718 Personal history of other venous thrombosis and embolism: Secondary | ICD-10-CM | POA: Diagnosis not present

## 2018-06-09 DIAGNOSIS — G629 Polyneuropathy, unspecified: Secondary | ICD-10-CM | POA: Diagnosis not present

## 2018-06-09 DIAGNOSIS — L97322 Non-pressure chronic ulcer of left 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: Secondary | ICD-10-CM | POA: Diagnosis not present

## 2018-06-09 DIAGNOSIS — R532 Functional quadriplegia: Secondary | ICD-10-CM | POA: Diagnosis not present

## 2018-06-09 DIAGNOSIS — L97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 layer exposed: Secondary | ICD-10-CM | POA: Diagnosis not present

## 2018-06-13 ENCOUNTER — Encounter: Payer: Self-pay | Admitting: Family Medicine

## 2018-06-16 DIAGNOSIS — L97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 layer exposed: Secondary | ICD-10-CM | POA: Diagnosis not present

## 2018-06-19 ENCOUNTER — Encounter (HOSPITAL_COMMUNITY): Payer: Self-pay | Admitting: Anesthesiology

## 2018-06-19 NOTE — Anesthesia Preprocedure Evaluation (Deleted)
Anesthesia Evaluation    Reviewed: Allergy & Precautions, Patient's Chart, lab work & pertinent test results  Airway        Dental   Pulmonary neg pulmonary ROS,           Cardiovascular negative cardio ROS       Neuro/Psych Anxiety Depression Bipolar Disorder Cerebral palsy, quadriplegic negative neurological ROS     GI/Hepatic Neg liver ROS, hiatal hernia, PUD, GERD  ,  Endo/Other  negative endocrine ROS  Renal/GU negative Renal ROS     Musculoskeletal  (+) Arthritis ,   Abdominal   Peds  Hematology negative hematology ROS (+)   Anesthesia Other Findings Day of surgery medications reviewed with the patient.  Reproductive/Obstetrics                             Anesthesia Physical Anesthesia Plan  ASA: III  Anesthesia Plan: MAC   Post-op Pain Management:    Induction:   PONV Risk Score and Plan: 1 and Treatment may vary due to age or medical condition and Propofol infusion  Airway Management Planned: Natural Airway and Nasal Cannula  Additional Equipment:   Intra-op Plan:   Post-operative Plan:   Informed Consent:   Plan Discussed with:   Anesthesia Plan Comments:         Anesthesia Quick Evaluation

## 2018-06-21 ENCOUNTER — Emergency Department (HOSPITAL_COMMUNITY)
Admission: EM | Admit: 2018-06-21 | Discharge: 2018-06-21 | Discharge status: Absent without leave | Payer: Medicare Other

## 2018-06-22 ENCOUNTER — Ambulatory Visit (HOSPITAL_COMMUNITY): Admission: RE | Admit: 2018-06-22 | Payer: Medicare Other | Source: Home / Self Care | Admitting: Gastroenterology

## 2018-06-22 ENCOUNTER — Encounter (HOSPITAL_COMMUNITY): Admission: RE | Payer: Self-pay | Source: Home / Self Care

## 2018-06-22 ENCOUNTER — Telehealth: Payer: Self-pay | Admitting: Gastroenterology

## 2018-06-22 SURGERY — COLONOSCOPY WITH PROPOFOL
Anesthesia: Monitor Anesthesia Care

## 2018-06-22 NOTE — Telephone Encounter (Signed)
[name], please work with patient and caregiver to arrange admission and on her next hospital week.  Week of 08/10/18

## 2018-06-22 NOTE — Telephone Encounter (Signed)
Left message for caregiver to call me, receptionist states she will not be back until Wednesday.

## 2018-06-24 NOTE — Telephone Encounter (Signed)
Caregiver calling back to schedule.

## 2018-06-25 NOTE — Telephone Encounter (Signed)
Spoke to patients caregiver and will have him admitted on 08-11-18 & procedure on 08-12-18. Will set reminder to have patient admitted. Caregiver verbalized understanding and will inform patients family.

## 2018-06-30 ENCOUNTER — Encounter (HOSPITAL_BASED_OUTPATIENT_CLINIC_OR_DEPARTMENT_OTHER): Payer: Medicare Other | Attending: Internal Medicine

## 2018-06-30 DIAGNOSIS — L89893 Pressure ulcer of other site, stage 3: Secondary | ICD-10-CM | POA: Diagnosis not present

## 2018-06-30 DIAGNOSIS — G8250 Quadriplegia, unspecified: Secondary | ICD-10-CM | POA: Insufficient documentation

## 2018-06-30 DIAGNOSIS — Z86718 Personal history of other venous thrombosis and embolism: Secondary | ICD-10-CM | POA: Insufficient documentation

## 2018-06-30 DIAGNOSIS — L97322 Non-pressure chronic ulcer of left 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: Secondary | ICD-10-CM | POA: Diagnosis present

## 2018-07-14 DIAGNOSIS — L97322 Non-pressure chronic ulcer of left 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: Secondary | ICD-10-CM | POA: Diagnosis not present

## 2018-07-28 ENCOUNTER — Encounter (HOSPITAL_BASED_OUTPATIENT_CLINIC_OR_DEPARTMENT_OTHER): Payer: Medicare Other | Attending: Internal Medicine

## 2018-07-28 DIAGNOSIS — L97322 Non-pressure chronic ulcer of left 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: Secondary | ICD-10-CM | POA: Diagnosis not present

## 2018-07-28 DIAGNOSIS — Z86718 Personal history of other venous thrombosis and embolism: Secondary | ICD-10-CM | POA: Diagnosis not present

## 2018-07-28 DIAGNOSIS — G629 Polyneuropathy, unspecified: Secondary | ICD-10-CM | POA: Insufficient documentation

## 2018-07-28 DIAGNOSIS — G8250 Quadriplegia, unspecified: Secondary | ICD-10-CM | POA: Diagnosis not present

## 2018-07-28 DIAGNOSIS — X58XXXA Exposure to other specified factors, initial encounter: Secondary | ICD-10-CM | POA: Insufficient documentation

## 2018-07-28 DIAGNOSIS — S90414A Abrasion, right lesser toe(s), initial encounter: Secondary | ICD-10-CM | POA: Diagnosis not present

## 2018-07-28 DIAGNOSIS — L97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 layer exposed: Secondary | ICD-10-CM | POA: Insufficient documentation

## 2018-07-29 ENCOUNTER — Encounter: Payer: Self-pay | Admitting: Family Medicine

## 2018-07-29 NOTE — Progress Notes (Signed)
Received fax from Lisa Roca, Care Coordinator at [facility] for letter of medical recommendation for the [facility] Sensor Ready Cushion with Smart Check as new recommended medical equipment for patient to reduce pressure ulcers given his chronic non-ambulatory state. Letter wrote and faxed back to [facility].

## 2018-08-10 ENCOUNTER — Encounter (HOSPITAL_COMMUNITY): Payer: Self-pay

## 2018-08-10 ENCOUNTER — Telehealth: Payer: Self-pay | Admitting: Gastroenterology

## 2018-08-10 ENCOUNTER — Inpatient Hospital Stay (HOSPITAL_COMMUNITY)
Admission: AD | Admit: 2018-08-10 | Discharge: 2018-08-15 | DRG: 391 | Disposition: A | Discharge status: Home / Self Care | Payer: Medicare Other | Attending: Internal Medicine | Admitting: Internal Medicine

## 2018-08-10 ENCOUNTER — Other Ambulatory Visit: Payer: Self-pay

## 2018-08-10 DIAGNOSIS — L98429 Non-pressure chronic ulcer of back with unspecified severity: Secondary | ICD-10-CM | POA: Diagnosis present

## 2018-08-10 DIAGNOSIS — G809 Cerebral palsy, unspecified: Secondary | ICD-10-CM | POA: Diagnosis not present

## 2018-08-10 DIAGNOSIS — Q399 Congenital malformation of esophagus, unspecified: Secondary | ICD-10-CM

## 2018-08-10 DIAGNOSIS — R32 Unspecified urinary incontinence: Secondary | ICD-10-CM | POA: Diagnosis present

## 2018-08-10 DIAGNOSIS — Z8711 Personal history of peptic ulcer disease: Secondary | ICD-10-CM

## 2018-08-10 DIAGNOSIS — L8990 Pressure ulcer of unspecified site, unspecified stage: Secondary | ICD-10-CM | POA: Diagnosis present

## 2018-08-10 DIAGNOSIS — L89629 Pressure ulcer of left heel, unspecified stage: Secondary | ICD-10-CM | POA: Diagnosis present

## 2018-08-10 DIAGNOSIS — D509 Iron deficiency anemia, unspecified: Secondary | ICD-10-CM

## 2018-08-10 DIAGNOSIS — K449 Diaphragmatic hernia without obstruction or gangrene: Secondary | ICD-10-CM | POA: Diagnosis present

## 2018-08-10 DIAGNOSIS — Z9884 Bariatric surgery status: Secondary | ICD-10-CM

## 2018-08-10 DIAGNOSIS — L89612 Pressure ulcer of right heel, stage 2: Secondary | ICD-10-CM | POA: Diagnosis present

## 2018-08-10 DIAGNOSIS — I82519 Chronic embolism and thrombosis of unspecified femoral vein: Secondary | ICD-10-CM | POA: Diagnosis present

## 2018-08-10 DIAGNOSIS — Z1211 Encounter for screening for malignant neoplasm of colon: Secondary | ICD-10-CM

## 2018-08-10 DIAGNOSIS — F79 Unspecified intellectual disabilities: Secondary | ICD-10-CM | POA: Diagnosis present

## 2018-08-10 DIAGNOSIS — F319 Bipolar disorder, unspecified: Secondary | ICD-10-CM | POA: Diagnosis present

## 2018-08-10 DIAGNOSIS — G8250 Quadriplegia, unspecified: Secondary | ICD-10-CM | POA: Diagnosis not present

## 2018-08-10 DIAGNOSIS — Z593 Problems related to living in residential institution: Secondary | ICD-10-CM

## 2018-08-10 DIAGNOSIS — K5909 Other constipation: Secondary | ICD-10-CM | POA: Diagnosis present

## 2018-08-10 DIAGNOSIS — K219 Gastro-esophageal reflux disease without esophagitis: Secondary | ICD-10-CM | POA: Diagnosis present

## 2018-08-10 DIAGNOSIS — K3189 Other diseases of stomach and duodenum: Secondary | ICD-10-CM | POA: Diagnosis present

## 2018-08-10 DIAGNOSIS — Z89411 Acquired absence of right great toe: Secondary | ICD-10-CM

## 2018-08-10 DIAGNOSIS — R532 Functional quadriplegia: Secondary | ICD-10-CM | POA: Diagnosis present

## 2018-08-10 DIAGNOSIS — E7800 Pure hypercholesterolemia, unspecified: Secondary | ICD-10-CM | POA: Diagnosis present

## 2018-08-10 DIAGNOSIS — G800 Spastic quadriplegic cerebral palsy: Secondary | ICD-10-CM | POA: Diagnosis present

## 2018-08-10 DIAGNOSIS — F419 Anxiety disorder, unspecified: Secondary | ICD-10-CM | POA: Diagnosis present

## 2018-08-10 DIAGNOSIS — Y738 Miscellaneous gastroenterology and urology devices associated with adverse incidents, not elsewhere classified: Secondary | ICD-10-CM | POA: Diagnosis present

## 2018-08-10 DIAGNOSIS — K5730 Diverticulosis of large intestine without perforation or abscess without bleeding: Secondary | ICD-10-CM | POA: Diagnosis present

## 2018-08-10 DIAGNOSIS — L89159 Pressure ulcer of sacral region, unspecified stage: Secondary | ICD-10-CM | POA: Diagnosis present

## 2018-08-10 DIAGNOSIS — K8020 Calculus of gallbladder without cholecystitis without obstruction: Secondary | ICD-10-CM | POA: Diagnosis present

## 2018-08-10 DIAGNOSIS — Z79899 Other long term (current) drug therapy: Secondary | ICD-10-CM

## 2018-08-10 DIAGNOSIS — Z66 Do not resuscitate: Secondary | ICD-10-CM | POA: Diagnosis present

## 2018-08-10 DIAGNOSIS — Z8619 Personal history of other infectious and parasitic diseases: Secondary | ICD-10-CM

## 2018-08-10 DIAGNOSIS — R748 Abnormal levels of other serum enzymes: Secondary | ICD-10-CM | POA: Diagnosis present

## 2018-08-10 DIAGNOSIS — K648 Other hemorrhoids: Secondary | ICD-10-CM | POA: Diagnosis present

## 2018-08-10 DIAGNOSIS — Z8701 Personal history of pneumonia (recurrent): Secondary | ICD-10-CM

## 2018-08-10 DIAGNOSIS — Y838 Other surgical procedures as the cause of abnormal reaction of the patient, or of later complication, without mention of misadventure at the time of the procedure: Secondary | ICD-10-CM | POA: Diagnosis present

## 2018-08-10 DIAGNOSIS — K2970 Gastritis, unspecified, without bleeding: Principal | ICD-10-CM | POA: Diagnosis present

## 2018-08-10 DIAGNOSIS — J309 Allergic rhinitis, unspecified: Secondary | ICD-10-CM | POA: Diagnosis present

## 2018-08-10 DIAGNOSIS — Z87440 Personal history of urinary (tract) infections: Secondary | ICD-10-CM

## 2018-08-10 LAB — FERRITIN: Ferritin: 17 ng/mL — ABNORMAL LOW (ref 24–336)

## 2018-08-10 LAB — IRON AND TIBC
Iron: 41 ug/dL — ABNORMAL LOW (ref 45–182)
SATURATION RATIOS: 10 % — AB (ref 17.9–39.5)
TIBC: 405 ug/dL (ref 250–450)
UIBC: 364 ug/dL

## 2018-08-10 MED ORDER — HYDROCORTISONE 1 % EX CREA
1.0000 "application " | TOPICAL_CREAM | Freq: Every day | CUTANEOUS | Status: DC
  Administered 2018-08-10 – 2018-08-14 (×5): 1 via TOPICAL
  Filled 2018-08-10: qty 28

## 2018-08-10 MED ORDER — BACLOFEN 20 MG PO TABS
20.0000 mg | ORAL_TABLET | Freq: Three times a day (TID) | ORAL | Status: DC
  Administered 2018-08-10 – 2018-08-15 (×15): 20 mg via ORAL
  Filled 2018-08-10 (×15): qty 1

## 2018-08-10 MED ORDER — PREVALON HEEL PROTECTOR MISC: 1.0000 | Freq: Every day | Status: DC

## 2018-08-10 MED ORDER — TAMSULOSIN HCL 0.4 MG PO CAPS
0.4000 mg | ORAL_CAPSULE | Freq: Every day | ORAL | Status: DC
  Administered 2018-08-10 – 2018-08-14 (×5): 0.4 mg via ORAL
  Filled 2018-08-10 (×5): qty 1

## 2018-08-10 MED ORDER — RISPERIDONE 1 MG PO TABS
2.0000 mg | ORAL_TABLET | Freq: Every day | ORAL | Status: DC
  Administered 2018-08-10 – 2018-08-14 (×5): 2 mg via ORAL
  Filled 2018-08-10 (×5): qty 2

## 2018-08-10 MED ORDER — ACETAMINOPHEN 500 MG PO TABS
1000.0000 mg | ORAL_TABLET | Freq: Four times a day (QID) | ORAL | Status: DC | PRN
  Administered 2018-08-11 – 2018-08-13 (×2): 1000 mg via ORAL
  Filled 2018-08-10 (×2): qty 2

## 2018-08-10 MED ORDER — LORATADINE 10 MG PO TABS
10.0000 mg | ORAL_TABLET | Freq: Every day | ORAL | Status: DC
  Administered 2018-08-10 – 2018-08-15 (×6): 10 mg via ORAL
  Filled 2018-08-10 (×6): qty 1

## 2018-08-10 MED ORDER — DIVALPROEX SODIUM 125 MG PO CSDR
500.0000 mg | DELAYED_RELEASE_CAPSULE | Freq: Two times a day (BID) | ORAL | Status: DC
  Administered 2018-08-10 – 2018-08-15 (×10): 500 mg via ORAL
  Filled 2018-08-10 (×10): qty 4

## 2018-08-10 MED ORDER — ALUM & MAG HYDROXIDE-SIMETH 200-200-20 MG/5ML PO SUSP: 30.0000 mL | Freq: Four times a day (QID) | ORAL | Status: DC | PRN

## 2018-08-10 MED ORDER — ONDANSETRON HCL 4 MG/2ML IJ SOLN: 4.0000 mg | Freq: Four times a day (QID) | INTRAMUSCULAR | Status: DC | PRN

## 2018-08-10 MED ORDER — PANTOPRAZOLE SODIUM 40 MG PO TBEC
80.0000 mg | DELAYED_RELEASE_TABLET | Freq: Every day | ORAL | Status: DC
  Administered 2018-08-11 – 2018-08-14 (×4): 80 mg via ORAL
  Filled 2018-08-10 (×5): qty 2

## 2018-08-10 MED ORDER — TIZANIDINE HCL 4 MG PO TABS
2.0000 mg | ORAL_TABLET | Freq: Three times a day (TID) | ORAL | Status: DC
  Administered 2018-08-10 – 2018-08-15 (×15): 2 mg via ORAL
  Filled 2018-08-10 (×15): qty 1

## 2018-08-10 MED ORDER — DOCUSATE SODIUM 100 MG PO CAPS
100.0000 mg | ORAL_CAPSULE | Freq: Two times a day (BID) | ORAL | Status: DC
  Administered 2018-08-10 – 2018-08-15 (×9): 100 mg via ORAL
  Filled 2018-08-10 (×10): qty 1

## 2018-08-10 MED ORDER — BISACODYL 5 MG PO TBEC: 10.0000 mg | DELAYED_RELEASE_TABLET | Freq: Every day | ORAL | Status: DC | PRN

## 2018-08-10 MED ORDER — FESOTERODINE FUMARATE ER 4 MG PO TB24
4.0000 mg | ORAL_TABLET | Freq: Every day | ORAL | Status: DC
  Administered 2018-08-10 – 2018-08-15 (×6): 4 mg via ORAL
  Filled 2018-08-10 (×6): qty 1

## 2018-08-10 MED ORDER — LORAZEPAM 1 MG PO TABS: 1.0000 mg | ORAL_TABLET | ORAL | Status: DC | PRN

## 2018-08-10 MED ORDER — ONDANSETRON HCL 4 MG PO TABS: 4.0000 mg | ORAL_TABLET | Freq: Four times a day (QID) | ORAL | Status: DC | PRN

## 2018-08-10 NOTE — Telephone Encounter (Signed)
Hello [name] from [facility] call and wanted to let you know that she has a bed that is avail. For the pt.

## 2018-08-10 NOTE — Progress Notes (Signed)
Patient direct admitted to [id number].  Rn called GI office for admission orders and was given a pager number for patient's gastroenterologist.  RN paged MD with no response.  RN re-contacted GI office and was given a pager number for NP rounding in hospital today.  RN paged NP who came to see patient but informed RN that hospitalist MD would complete admission.  NP states she spoke with hospitalist about admission.  When no order were forthcoming, RN paged hospitalist for the admission orders.  Awaiting response at this time.

## 2018-08-10 NOTE — Consult Note (Signed)
Referring Provider: [facility]   Primary Care Physician:  [name], DO Primary Gastroenterologist: Thornton Park, MD Reason for Consultation:  Needs EGD for evaluation of gastric wall thickening on CT scan and needs colonoscopy for colon cancer screening    ASSESSMENT / PLAN:     1. 58 yo male with cerebral palsy / quadriplegia here for colonoscopy and EGD for evaluation of gastric wall thickening on CT scan. Additionally he has mild microcytic anemia -Due to scheduling patient will not be able to have the procedures until Wed but should be able to go home afterwards.  -Will give soft diet today, start clears in am.  -Patient has hx of constipation. I'm going to give him 2 dulcolax this evening -can insert rectal tube when prepping bowels to divert stool .  2. Elevated Alk phos, chronic. Remainder of liver tests normal. Alk phos stable at 174 at last check in January 2020. No biliary duct dilation on CT scan in October 2019.  -outpatient workup is okay  3. Cholelithiasis, asymptomatic   HPI:      HPI: [name] is a 58 y.o. male with cerebral palsy / quadraplegia, bipolar disorder, anxiety, GERD, hx of PUD, and chronic constipation. We saw him in clinic mid December for evaluation of abnormal CT scan showing gastric wall thickening and hiatal hernia. CT scan also showed a large stool burden. We recommended fiber and thought that Amitiza might be an option in the future. For evaluation of CT scan findings patient was scheduled for EGD. He was scheduled for a colonoscopy to be done the same day. Patient has never had a colonoscopy. Labs in January show mild microcytic anemia.  Apparently endoscopic procedures had to be rescheduled. Patient is now here for admission to prepare for EGD  / colonoscopy.    Patient says stools are generally soft but still requires straining. He has no abdominal pain. His appetite is great and he is on a regular diet without any  problems chewing / swallow.    Past Medical History:  Diagnosis Date  . Anxiety    takes Ativan daily as needed  . Bipolar disorder ([place])    takes Depakote daily  . CEREBRAL PALSY 07/24/2006  . Chronic bronchitis ([place])    "gets it q yr"  . Constipation    takes Miralax daily as needed  . Depression    takes Risperdal nightly  . Environmental allergies    takes Zyrtec daily  . GASTRIC ULCER ACUTE WITH HEMORRHAGE 07/24/2006  . GERD (gastroesophageal reflux disease)    takes Pravacid daily  . History of blood transfusion    no abnormal reaction noted  . History of hiatal hernia   . History of shingles   . History of stomach ulcers ~ 1996  . Hypercholesterolemia   . Pneumonia "quite a few times"   in 2015 and in June 2016  . Recurrent UTI (urinary tract infection)   . URINARY INCONTINENCE 02/09/2010   takes Detrol daily-occasionally incontinence  . Weakness    numbness in extremities    Past Surgical History:  Procedure Laterality Date  . AMPUTATION Right 12/14/2014   Procedure: AMPUTATION DIGIT RIGHT GREAT TOE MTP;  Surgeon: Newt Minion, MD;  Location: [facility];  Service: Orthopedics;  Laterality: Right;  . head surgery  as a baby   due to "water head"   . HERNIA REPAIR    . HIATAL HERNIA REPAIR  1982  . LEG SURGERY Bilateral ~  1967   "legs were scissored; stretched them out"    Prior to Admission medications   Medication Sig Start Date End Date Taking? Authorizing Provider  baclofen (LIORESAL) 20 MG tablet Take 1 tablet (20 mg total) by mouth 3 (three) times daily. 07/18/16  Yes Kirsteins, Luanna Salk, MD  cetirizine (ZYRTEC) 10 MG tablet Take 1 tablet (10 mg total) by mouth daily. Patient taking differently: Take 10 mg by mouth at bedtime.  11/01/14  Yes Street, Sharon Mt, MD  Cholecalciferol (VITAMIN D3) 5000 units CAPS Take 5,000 Units by mouth daily.   Yes [provider]  diclofenac sodium (VOLTAREN) 1 % GEL Apply 4 g topically 4 (four) times daily.   Yes  [provider]  divalproex (DEPAKOTE SPRINKLE) 125 MG capsule Take 500 mg by mouth 2 (two) times daily.    Yes [provider]  docusate sodium (COLACE) 100 MG capsule Take 1 capsule (100 mg total) by mouth 2 (two) times daily. 04/07/18  Yes Mullis, Kiersten P, DO  EAR DROPS EARWAX AID 6.5 % OTIC solution PLACE 5 DROPS INTO THE LEFT EAR TWICE DAILY Patient taking differently: Place 5 drops into the left ear 2 (two) times daily.  04/21/18  Yes Mullis, Kiersten P, DO  Elastic Bandages & Supports (JOBST ANTI-EM KNEE HIGH MED) MISC by Does not apply route. Apply once in the morning and remove at bedtime daily   Yes [provider]  Foot Care Products (PREVALON HEEL PROTECTOR) MISC 1 Device by Does not apply route daily. 06/20/15  Yes Verner Mould, MD  hydrocortisone cream 1 % APPLY TOPICALLY TO FACE AT BEDTIME Patient taking differently: Apply 1 application topically at bedtime.  08/15/17  Yes Hennie Duos, MD  ketoconazole (NIZORAL) 2 % shampoo APPLY TOPICALLY TO AFFECTED AREA(S) TWICE WEEKLY AS DIRECTED Patient taking differently: Apply 1 application topically 2 (two) times a week.  10/22/17  Yes Carlyle Dolly, MD  LORazepam (ATIVAN) 2 MG tablet Take 1 mg by mouth every 4 (four) hours as needed for anxiety.    Yes [provider]  omeprazole (PRILOSEC) 40 MG capsule Take 1 capsule (40 mg total) by mouth daily. 05/07/18  Yes Thornton Park, MD  Oyster Shell (OYSTER CALCIUM) 500 MG TABS tablet Take 1,500 mg of elemental calcium by mouth daily.   Yes [provider]  risperiDONE (RISPERDAL) 2 MG tablet Take 2 mg by mouth at bedtime.   Yes [provider]  tamsulosin (FLOMAX) 0.4 MG CAPS capsule Take 1 capsule (0.4 mg total) by mouth daily. 02/05/18  Yes Enrique Sack, FNP  tiZANidine (ZANAFLEX) 2 MG tablet Take 2 mg by mouth 3 (three) times daily.  07/22/18  Yes [provider]  tolterodine (DETROL LA) 2 MG 24 hr  capsule Take 1 capsule (2 mg total) by mouth daily. Patient taking differently: Take 2 mg by mouth daily at 6 PM.  04/08/13  Yes Street, Sharon Mt, MD  tretinoin (RETIN-A) 0.025 % gel Apply 1 application topically at bedtime.   Yes [provider]  triamcinolone cream (KENALOG) 0.1 % Apply 1 application topically daily.    Yes [provider]  vitamin C (VITAMIN C) 250 MG tablet Take 1 tablet (250 mg total) by mouth daily. 01/18/16  Yes Smiley Houseman, MD  Zinc Oxide 40 % PSTE APPLY A THIN LAYER TOPICALLY TO BUTTOCKS AND SCROTUM TWICE DAILY *BEFIN AFTER COMPLETION OF CLOTRIMAZOLE OINTMENT* Patient taking differently: Apply 1 application topically 2 (two) times daily.  To scrotum and buttocks *Begin after completion of clotrimazole ointment* 09/18/17  Yes Riccio, Angela C, DO  acetaminophen (TYLENOL) 500 MG tablet Take 1,000 mg by mouth every 6 (six) hours as needed for moderate pain.    [provider]  alum & mag hydroxide-simeth (MYLANTA) 200-200-20 MG/5ML suspension Take 30 mLs by mouth every 6 (six) hours as needed for indigestion or heartburn. Use as directed as needed for nausea/indigestion per standing order    [provider]  brompheniramine-pseudoephedrine (DIMETAPP) 1-15 MG/5ML ELIX Take 10 mLs by mouth 2 (two) times daily as needed (cold symptoms). Use as directed as needed for cold symptoms per standing orders    [provider]  guaifenesin (ROBITUSSIN) 100 MG/5ML syrup Take 200 mg by mouth as needed for cough.    [provider]  loperamide (IMODIUM) 2 MG capsule Take 2 mg by mouth as needed for diarrhea or loose stools.    [provider]  magnesium hydroxide (MILK OF MAGNESIA) 400 MG/5ML suspension Take 30 mLs by mouth daily as needed for moderate constipation. Take as directed as needed for constipation per standing order    [provider]  Neomycin-Bacitracin-Polymyxin (TRIPLE ANTIBIOTIC EX) Apply 1  application topically as needed (cuts/scrapes/scratches).    [provider]  tolterodine (DETROL LA) 2 MG 24 hr capsule Take 1 capsule (2 mg total) by mouth daily. 07/31/10 04/08/13  Dickie La, MD    No current facility-administered medications for this encounter.     Allergies as of 08/10/2018 - Review Complete 08/10/2018  Allergen Reaction Noted  . Adhesive [tape] Rash 12/21/2010    Family History  Adopted: Yes    Social History   Socioeconomic History  . Marital status: Single    Spouse name: Not on file  . Number of children: Not on file  . Years of education: Not on file  . Highest education level: Not on file  Occupational History  . Not on file  Social Needs  . Financial resource strain: Not on file  . Food insecurity:    Worry: Not on file    Inability: Not on file  . Transportation needs:    Medical: Not on file    Non-medical: Not on file  Tobacco Use  . Smoking status: Never Smoker  . Smokeless tobacco: Never Used  Substance and Sexual Activity  . Alcohol use: No  . Drug use: No  . Sexual activity: Never  Lifestyle  . Physical activity:    Days per week: Not on file    Minutes per session: Not on file  . Stress: Not on file  Relationships  . Social connections:    Talks on phone: Not on file    Gets together: Not on file    Attends religious service: Not on file    Active member of club or organization: Not on file    Attends meetings of clubs or organizations: Not on file    Relationship status: Not on file  . Intimate partner violence:    Fear of current or ex partner: Not on file    Emotionally abused: Not on file    Physically abused: Not on file    Forced sexual activity: Not on file  Other Topics Concern  . Not on file  Social History Narrative  . Not on file    Review of Systems: All systems reviewed and negative except where noted in HPI.  Physical Exam: Vital signs in last 24 hours: Temp:  [  97.9 F (36.6 C)-98 F  (36.7 C)] 97.9 F (36.6 C) (03/16 1336) Pulse Rate:  [60-66] 66 (03/16 1336) Resp:  [16-20] 16 (03/16 1336) BP: (122-133)/(72-76) 122/72 (03/16 1336) SpO2:  [100 %] 100 % (03/16 1336) Weight:  [65.5 kg] 65.5 kg (03/16 1117)   General:   Alert, male  in NAD Psych:  Pleasant, cooperative. Normal mood and affect. Eyes:  Pupils equal, sclera clear, no icterus.   Conjunctiva pink. Ears:  Normal auditory acuity. Nose:  No deformity, discharge,  or lesions. Neck:  Supple; no masses Lungs:  Clear throughout to auscultation.   Heart:  Regular rate and rhythm; no murmurs, no lower extremity edema Abdomen:  Soft, non-distended, nontender, BS active, no palp mass    Rectal:  Deferred  Msk:  Generalized muscle atropy / deformities . Neurologic:  Alert and  oriented x4;  grossly normal neurologically. Skin:  Intact without significant lesions or rashes.   Intake/Output from previous day: No intake/output data recorded. Intake/Output this shift: No intake/output data recorded.   Studies/Results: No results found.   [name], NP-C @  08/10/2018, 2:58 PM

## 2018-08-10 NOTE — Telephone Encounter (Signed)
Patient is on his way to the hospital, will be assigned to [address], room 1604.

## 2018-08-10 NOTE — H&P (Signed)
History and Physical  [name] LSL:[id number] DOB: [birth year] DOA: 08/10/2018  Referring physician: Lucrezia Europe, GI PA PCP: Joana Reamer, DO  Outpatient Specialists: [facility] Patient coming from: Group home & is mostly in bed, ambulates with wheelchair  Chief Complaint: Gastric thickening  HPI: [name] is a 58 y.o. male with medical history significant for spastic quadriparesis, cerebral palsy and mental retardation sent over from his group home for planned endoscopy and colonoscopy after patient noted to have some abnormal thickening seen on abdominal CT.  Due to patient's bed status and mentation issues, it was felt that he would not be able to give bowel prep to himself and therefore needed to come in to have this done prior to scope.   Review of Systems: Patient seen after arrival to floor. Pt with no complaints.  Does state that he has problems with chronic constipation  Pt denies any headache, vision changes, dysphagia, abdominal pain, nausea or vomiting, shortness of breath, hematuria or dysuria.  Review of systems are otherwise negative   Past Medical History:  Diagnosis Date  . Anxiety    takes Ativan daily as needed  . Bipolar disorder ([facility])    takes Depakote daily  . CEREBRAL PALSY 07/24/2006  . Chronic bronchitis ([facility])    "gets it q yr"  . Constipation    takes Miralax daily as needed  . Depression    takes Risperdal nightly  . Environmental allergies    takes Zyrtec daily  . GASTRIC ULCER ACUTE WITH HEMORRHAGE 07/24/2006  . GERD (gastroesophageal reflux disease)    takes Pravacid daily  . History of blood transfusion    no abnormal reaction noted  . History of hiatal hernia   . History of shingles   . History of stomach ulcers ~ 1996  . Hypercholesterolemia   . Pneumonia "quite a few times"   in 2015 and in June 2016  . Recurrent UTI (urinary tract infection)   . URINARY INCONTINENCE 02/09/2010   takes Detrol daily-occasionally  incontinence  . Weakness    numbness in extremities   Past Surgical History:  Procedure Laterality Date  . AMPUTATION Right 12/14/2014   Procedure: AMPUTATION DIGIT RIGHT GREAT TOE MTP;  Surgeon: Nadara Mustard, MD;  Location: [facility];  Service: Orthopedics;  Laterality: Right;  . head surgery  as a baby   due to "water head"   . HERNIA REPAIR    . HIATAL HERNIA REPAIR  1982  . LEG SURGERY Bilateral ~ 1967   "legs were scissored; stretched them out"    Social History:  reports that he has never smoked. He has never used smokeless tobacco. He reports that he does not drink alcohol or use drugs.  Does not really ambulate, mostly in bed   Allergies  Allergen Reactions  . Adhesive [Tape] Rash    Family History  Adopted: Yes      Prior to Admission medications   Medication Sig Start Date End Date Taking? Authorizing Provider  baclofen (LIORESAL) 20 MG tablet Take 1 tablet (20 mg total) by mouth 3 (three) times daily. 07/18/16  Yes Kirsteins, Victorino Sparrow, MD  cetirizine (ZYRTEC) 10 MG tablet Take 1 tablet (10 mg total) by mouth daily. Patient taking differently: Take 10 mg by mouth at bedtime.  11/01/14  Yes Street, Stephanie Coup, MD  Cholecalciferol (VITAMIN D3) 5000 units CAPS Take 5,000 Units by mouth daily.   Yes [provider]  diclofenac sodium (VOLTAREN) 1 %  GEL Apply 4 g topically 4 (four) times daily.   Yes [provider]  divalproex (DEPAKOTE SPRINKLE) 125 MG capsule Take 500 mg by mouth 2 (two) times daily.    Yes [provider]  docusate sodium (COLACE) 100 MG capsule Take 1 capsule (100 mg total) by mouth 2 (two) times daily. 04/07/18  Yes Mullis, Kiersten P, DO  EAR DROPS EARWAX AID 6.5 % OTIC solution PLACE 5 DROPS INTO THE LEFT EAR TWICE DAILY Patient taking differently: Place 5 drops into the left ear 2 (two) times daily.  04/21/18  Yes Mullis, Kiersten P, DO  Elastic Bandages & Supports (JOBST ANTI-EM KNEE HIGH MED) MISC by Does not apply route.  Apply once in the morning and remove at bedtime daily   Yes [provider]  Foot Care Products (PREVALON HEEL PROTECTOR) MISC 1 Device by Does not apply route daily. 06/20/15  Yes Marquette Saa, MD  hydrocortisone cream 1 % APPLY TOPICALLY TO FACE AT BEDTIME Patient taking differently: Apply 1 application topically at bedtime.  08/15/17  Yes Margit Hanks, MD  ketoconazole (NIZORAL) 2 % shampoo APPLY TOPICALLY TO AFFECTED AREA(S) TWICE WEEKLY AS DIRECTED Patient taking differently: Apply 1 application topically 2 (two) times a week.  10/22/17  Yes Beaulah Dinning, MD  LORazepam (ATIVAN) 2 MG tablet Take 1 mg by mouth every 4 (four) hours as needed for anxiety.    Yes [provider]  omeprazole (PRILOSEC) 40 MG capsule Take 1 capsule (40 mg total) by mouth daily. 05/07/18  Yes [name], MD  Oyster Shell (OYSTER CALCIUM) 500 MG TABS tablet Take 1,500 mg of elemental calcium by mouth daily.   Yes [provider]  risperiDONE (RISPERDAL) 2 MG tablet Take 2 mg by mouth at bedtime.   Yes [provider]  tamsulosin (FLOMAX) 0.4 MG CAPS capsule Take 1 capsule (0.4 mg total) by mouth daily. 02/05/18  Yes Lurline Idol, FNP  tiZANidine (ZANAFLEX) 2 MG tablet Take 2 mg by mouth 3 (three) times daily.  07/22/18  Yes [provider]  tolterodine (DETROL LA) 2 MG 24 hr capsule Take 1 capsule (2 mg total) by mouth daily. Patient taking differently: Take 2 mg by mouth daily at 6 PM.  04/08/13  Yes Street, Stephanie Coup, MD  tretinoin (RETIN-A) 0.025 % gel Apply 1 application topically at bedtime.   Yes [provider]  triamcinolone cream (KENALOG) 0.1 % Apply 1 application topically daily.    Yes [provider]  vitamin C (VITAMIN C) 250 MG tablet Take 1 tablet (250 mg total) by mouth daily. 01/18/16  Yes Palma Holter, MD  Zinc Oxide 40 % PSTE APPLY A THIN LAYER TOPICALLY TO BUTTOCKS AND SCROTUM TWICE DAILY  *BEFIN AFTER COMPLETION OF CLOTRIMAZOLE OINTMENT* Patient taking differently: Apply 1 application topically 2 (two) times daily. To scrotum and buttocks *Begin after completion of clotrimazole ointment* 09/18/17  Yes Riccio, Angela C, DO  acetaminophen (TYLENOL) 500 MG tablet Take 1,000 mg by mouth every 6 (six) hours as needed for moderate pain.    [provider]  alum & mag hydroxide-simeth (MYLANTA) 200-200-20 MG/5ML suspension Take 30 mLs by mouth every 6 (six) hours as needed for indigestion or heartburn. Use as directed as needed for nausea/indigestion per standing order    [provider]  brompheniramine-pseudoephedrine (DIMETAPP) 1-15 MG/5ML ELIX Take 10 mLs by mouth 2 (two) times daily as needed (cold symptoms). Use as directed as needed for cold symptoms  per standing orders    [provider]  guaifenesin (ROBITUSSIN) 100 MG/5ML syrup Take 200 mg by mouth as needed for cough.    [provider]  loperamide (IMODIUM) 2 MG capsule Take 2 mg by mouth as needed for diarrhea or loose stools.    [provider]  magnesium hydroxide (MILK OF MAGNESIA) 400 MG/5ML suspension Take 30 mLs by mouth daily as needed for moderate constipation. Take as directed as needed for constipation per standing order    [provider]  Neomycin-Bacitracin-Polymyxin (TRIPLE ANTIBIOTIC EX) Apply 1 application topically as needed (cuts/scrapes/scratches).    [provider]  tolterodine (DETROL LA) 2 MG 24 hr capsule Take 1 capsule (2 mg total) by mouth daily. 07/31/10 04/08/13  Nestor Ramp, MD    Physical Exam: BP 122/72 (BP Location: Left Arm)   Pulse 66   Temp 97.9 F (36.6 C) (Oral)   Resp 16   Ht 6\' 1"  (1.854 m)   Wt 65.5 kg   SpO2 100%   BMI 19.05 kg/m   General: Awake, oriented x2 Eyes: Sclera nonicteric, extraocular movements are intact ENT: Normocephalic and atraumatic, mucous membranes are slightly dry Neck: Supple, no JVD  Cardiovascular: Regular rate and rhythm, S1-S2 Respiratory: Clear to auscultation bilaterally Abdomen: Soft, nontender, nondistended, positive bowel sounds Skin: Stage I-II decubitus ulcer, also on right heel-both present on admission Musculoskeletal: No clubbing or cyanosis, 1+ pitting edema from the knees down bilaterally Psychiatric: Moderate mental retardation Neurologic: Spastic quadriplegia, cerebral palsy          Labs on Admission:  Basic Metabolic Panel: No results for input(s): NA, K, CL, CO2, GLUCOSE, BUN, CREATININE, CALCIUM, MG, PHOS in the last 168 hours. Liver Function Tests: No results for input(s): AST, ALT, ALKPHOS, BILITOT, PROT, ALBUMIN in the last 168 hours. No results for input(s): LIPASE, AMYLASE in the last 168 hours. No results for input(s): AMMONIA in the last 168 hours. CBC: No results for input(s): WBC, NEUTROABS, HGB, HCT, MCV, PLT in the last 168 hours. Cardiac Enzymes: No results for input(s): CKTOTAL, CKMB, CKMBINDEX, TROPONINI in the last 168 hours.  BNP (last 3 results) No results for input(s): BNP in the last 8760 hours.  ProBNP (last 3 results) No results for input(s): PROBNP in the last 8760 hours.  CBG: No results for input(s): GLUCAP in the last 168 hours.  Radiological Exams on Admission: No results found.  EKG: Not done  Assessment/Plan Present on Admission: . Pressure injury of skin: Sacral and heel.  Present on admission.  Placed boot.  Nursing wound care to follow . Infantile cerebral palsy (HCC): Continue Depakote . 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 Quadriplegia (HCC) . Gastric wall thickening: Seen on abdominal CT.  For scope, unfortunately cannot be done until 3/18.  Bowel prep on 3/17.  [facility] following . Alkaline phosphatase elevation . Cholelithiasis  Active Problems:   Infantile cerebral palsy (HCC)   Person living in residential institution   [facility]adriplegia [facility])   Chronic deep vein  thrombosis (DVT) of femoral vein (HCC)   Pressure injury of skin   Gastric wall thickening   Alkaline phosphatase elevation   Cholelithiasis   Colon cancer screening   Microcytic anemia   DVT prophylaxis: SCDs  Code Status: DNR as per records  Family Communication: No family present, patient from group home  Disposition Plan: Discharge once scope completed  Consults called: [facility]  Admission status: Given low acuity, placed under observation.  Unfortunately, patient not able to have scope done due to scheduling issues until 3/18    Hollice Espy MD [facility] Pager [phone]  If 7PM-7AM, please contact night-coverage [email or web address] Password [facility]  08/10/2018, 6:41 PM

## 2018-08-11 ENCOUNTER — Ambulatory Visit (HOSPITAL_COMMUNITY): Admission: RE | Admit: 2018-08-11 | Payer: Medicare Other | Source: Home / Self Care | Admitting: Gastroenterology

## 2018-08-11 DIAGNOSIS — K8020 Calculus of gallbladder without cholecystitis without obstruction: Secondary | ICD-10-CM | POA: Diagnosis present

## 2018-08-11 DIAGNOSIS — Z9884 Bariatric surgery status: Secondary | ICD-10-CM | POA: Diagnosis not present

## 2018-08-11 DIAGNOSIS — K3189 Other diseases of stomach and duodenum: Secondary | ICD-10-CM | POA: Diagnosis not present

## 2018-08-11 DIAGNOSIS — Q399 Congenital malformation of esophagus, unspecified: Secondary | ICD-10-CM | POA: Diagnosis not present

## 2018-08-11 DIAGNOSIS — R32 Unspecified urinary incontinence: Secondary | ICD-10-CM | POA: Diagnosis present

## 2018-08-11 DIAGNOSIS — K2970 Gastritis, unspecified, without bleeding: Secondary | ICD-10-CM | POA: Diagnosis present

## 2018-08-11 DIAGNOSIS — Y838 Other surgical procedures as the cause of abnormal reaction of the patient, or of later complication, without mention of misadventure at the time of the procedure: Secondary | ICD-10-CM | POA: Diagnosis present

## 2018-08-11 DIAGNOSIS — G800 Spastic quadriplegic cerebral palsy: Secondary | ICD-10-CM | POA: Diagnosis present

## 2018-08-11 DIAGNOSIS — L89629 Pressure ulcer of left heel, unspecified stage: Secondary | ICD-10-CM | POA: Diagnosis present

## 2018-08-11 DIAGNOSIS — K5730 Diverticulosis of large intestine without perforation or abscess without bleeding: Secondary | ICD-10-CM | POA: Diagnosis not present

## 2018-08-11 DIAGNOSIS — L89612 Pressure ulcer of right heel, stage 2: Secondary | ICD-10-CM | POA: Diagnosis present

## 2018-08-11 DIAGNOSIS — Z8619 Personal history of other infectious and parasitic diseases: Secondary | ICD-10-CM | POA: Diagnosis not present

## 2018-08-11 DIAGNOSIS — K219 Gastro-esophageal reflux disease without esophagitis: Secondary | ICD-10-CM | POA: Diagnosis present

## 2018-08-11 DIAGNOSIS — Z980 Intestinal bypass and anastomosis status: Secondary | ICD-10-CM | POA: Diagnosis not present

## 2018-08-11 DIAGNOSIS — Z593 Problems related to living in residential institution: Secondary | ICD-10-CM | POA: Diagnosis not present

## 2018-08-11 DIAGNOSIS — R748 Abnormal levels of other serum enzymes: Secondary | ICD-10-CM | POA: Diagnosis present

## 2018-08-11 DIAGNOSIS — F319 Bipolar disorder, unspecified: Secondary | ICD-10-CM | POA: Diagnosis present

## 2018-08-11 DIAGNOSIS — I82519 Chronic embolism and thrombosis of unspecified femoral vein: Secondary | ICD-10-CM | POA: Diagnosis present

## 2018-08-11 DIAGNOSIS — I82512 Chronic embolism and thrombosis of left femoral vein: Secondary | ICD-10-CM | POA: Diagnosis not present

## 2018-08-11 DIAGNOSIS — F419 Anxiety disorder, unspecified: Secondary | ICD-10-CM | POA: Diagnosis present

## 2018-08-11 DIAGNOSIS — D509 Iron deficiency anemia, unspecified: Secondary | ICD-10-CM | POA: Diagnosis present

## 2018-08-11 DIAGNOSIS — G809 Cerebral palsy, unspecified: Secondary | ICD-10-CM | POA: Diagnosis not present

## 2018-08-11 DIAGNOSIS — R935 Abnormal findings on diagnostic imaging of other abdominal regions, including retroperitoneum: Secondary | ICD-10-CM | POA: Diagnosis present

## 2018-08-11 DIAGNOSIS — L89159 Pressure ulcer of sacral region, unspecified stage: Secondary | ICD-10-CM | POA: Diagnosis present

## 2018-08-11 DIAGNOSIS — K449 Diaphragmatic hernia without obstruction or gangrene: Secondary | ICD-10-CM | POA: Diagnosis present

## 2018-08-11 DIAGNOSIS — E7800 Pure hypercholesterolemia, unspecified: Secondary | ICD-10-CM | POA: Diagnosis present

## 2018-08-11 DIAGNOSIS — K648 Other hemorrhoids: Secondary | ICD-10-CM | POA: Diagnosis not present

## 2018-08-11 DIAGNOSIS — F79 Unspecified intellectual disabilities: Secondary | ICD-10-CM | POA: Diagnosis present

## 2018-08-11 DIAGNOSIS — K5909 Other constipation: Secondary | ICD-10-CM | POA: Diagnosis present

## 2018-08-11 DIAGNOSIS — J309 Allergic rhinitis, unspecified: Secondary | ICD-10-CM | POA: Diagnosis present

## 2018-08-11 DIAGNOSIS — Y738 Miscellaneous gastroenterology and urology devices associated with adverse incidents, not elsewhere classified: Secondary | ICD-10-CM | POA: Diagnosis present

## 2018-08-11 LAB — CBC
HCT: 34.1 % — ABNORMAL LOW (ref 39.0–52.0)
Hemoglobin: 10.2 g/dL — ABNORMAL LOW (ref 13.0–17.0)
MCH: 23.9 pg — ABNORMAL LOW (ref 26.0–34.0)
MCHC: 29.9 g/dL — ABNORMAL LOW (ref 30.0–36.0)
MCV: 79.9 fL — ABNORMAL LOW (ref 80.0–100.0)
Platelets: 328 10*3/uL (ref 150–400)
RBC: 4.27 MIL/uL (ref 4.22–5.81)
RDW: 19.1 % — ABNORMAL HIGH (ref 11.5–15.5)
WBC: 7.2 10*3/uL (ref 4.0–10.5)
nRBC: 0 % (ref 0.0–0.2)

## 2018-08-11 LAB — BASIC METABOLIC PANEL
Anion gap: 6 (ref 5–15)
BUN: 8 mg/dL (ref 6–20)
CHLORIDE: 105 mmol/L (ref 98–111)
CO2: 26 mmol/L (ref 22–32)
Calcium: 8.9 mg/dL (ref 8.9–10.3)
Creatinine, Ser: 0.57 mg/dL — ABNORMAL LOW (ref 0.61–1.24)
GFR calc Af Amer: >60 mL/min (ref >60)
GFR calc non Af Amer: >60 mL/min (ref >60)
Glucose, Bld: 86 mg/dL (ref 70–99)
Potassium: 3.7 mmol/L (ref 3.5–5.1)
Sodium: 137 mmol/L (ref 135–145)

## 2018-08-11 LAB — HIV ANTIBODY (ROUTINE TESTING W REFLEX): HIV Screen 4th Generation wRfx: NONREACTIVE

## 2018-08-11 MED ORDER — PEG-KCL-NACL-NASULF-NA ASC-C 100 G PO SOLR
0.5000 | Freq: Once | ORAL | Status: AC
  Administered 2018-08-12: 100 g via ORAL
  Filled 2018-08-11: qty 1

## 2018-08-11 MED ORDER — BISACODYL 5 MG PO TBEC
10.0000 mg | DELAYED_RELEASE_TABLET | Freq: Once | ORAL | Status: AC
  Administered 2018-08-11: 10 mg via ORAL
  Filled 2018-08-11: qty 2

## 2018-08-11 MED ORDER — PEG-KCL-NACL-NASULF-NA ASC-C 100 G PO SOLR: 1.0000 | Freq: Once | ORAL | Status: DC

## 2018-08-11 MED ORDER — POLYETHYLENE GLYCOL 3350 17 G PO PACK
17.0000 g | PACK | Freq: Once | ORAL | Status: AC
  Administered 2018-08-11: 17 g via ORAL
  Filled 2018-08-11: qty 1

## 2018-08-11 MED ORDER — PEG-KCL-NACL-NASULF-NA ASC-C 100 G PO SOLR
0.5000 | Freq: Once | ORAL | Status: AC
  Administered 2018-08-11: 100 g via ORAL
  Filled 2018-08-11: qty 1

## 2018-08-11 NOTE — Progress Notes (Signed)
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  Due to patient's mental issue and inability for  bowel preparation by himself he needed to be  Admitted .  Plan is to discharge him back to group home after EGD /colonoscopy  tomorrow.  Assessment & Plan:   Active Problems:   Infantile cerebral palsy (HCC)   Person living in residential institution   Quadriplegia [facility])   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)   Pressure injury of skin   Gastric wall thickening   Alkaline phosphatase elevation   Cholelithiasis   Colon cancer screening   Microcytic anemia  Gastric wall thickening as seen on the CT scan: GI planning for EGD tomorrow.  N.p.o. after midnight.  Mild microcytic anemia: Plan for colonoscopy tomorrow.  Studies shows low iron and ferritin.  Might need iron supplementation on discharge.  Has microcytic anemia, stable.  Chronically elevated alkaline phosphatase: Work-up can be done as an outpatient.  Also has chronic asymptomatic cholelithiasis  Chronic DVT of femoral vein: Not on anticoagulation on reviewing his home medications.  Cerebral palsy/quadriplegia: Lives in a group home.  Continue supportive care.  On Depakote.  Also on Risperdal  Sacral/heel ulcers: Provide supportive care.  Present on  admission.  On boots.           DVT prophylaxis:SCD Code Status: DNR Family Communication: None present at the bedside Disposition Plan: Back to group home likely tomorrow   Consultants: GI  Procedures: None  Antimicrobials:  Anti-infectives (From admission, onward)   None      Subjective: Patient seen and examined  at bedside this morning.  Remains comfortable.  Hemodynamically stable.  Communicates.Denied any problem  Objective: Vitals:   08/10/18 1336 08/10/18 2123 08/11/18 0446 08/11/18 1311  BP: 122/72 114/69 110/66 (!) 129/98  Pulse: 66 (!) 59 68 71  Resp: 16 16 17 20   Temp: 97.9 F (36.6 C) 98.2 F (36.8 C) 98.2 F (36.8 C) 98.4 F (36.9 C)  TempSrc: Oral Oral Oral Oral  SpO2: 100% 97% 98% 100%  Weight:      Height:        Intake/Output Summary (Last 24 hours) at 08/11/2018 1417 Last data filed at 08/11/2018 1245 Gross per 24 hour  Intake 1800 ml  Output 2600 ml  Net -800 ml   Filed Weights   08/10/18 1117  Weight: 65.5 kg    Examination:  General exam: Appears calm and comfortable ,Not in distress,average built HEENT:PERRL,Oral mucosa moist, Ear/Nose normal on gross exam Respiratory system: Bilateral equal air entry, normal vesicular breath sounds, no wheezes or crackles  Cardiovascular system: S1 & S2 heard, RRR. No JVD, murmurs, rubs, gallops or clicks. No pedal edema. Gastrointestinal system: Abdomen is nondistended, soft and nontender. No organomegaly or masses felt. Normal bowel sounds heard. Central nervous system: Alert and oriented to place. Mental retardation Extremities: No edema, no clubbing ,no cyanosis, distal peripheral pulses palpable. Atrophied lower extremities with strictures, chronic ulcers wrapped with soft boot Skin: No rashes, lesions ,no icterus ,no pallor MSK: atropied muscles due to disuse Psychiatry: Judgement and insight appear impaired   Data Reviewed: I have personally reviewed following labs  and imaging studies  CBC: Recent Labs  Lab 08/11/18 0322  WBC 7.2  HGB 10.2*  HCT 34.1*  MCV 79.9*  PLT 892   Basic Metabolic Panel: Recent Labs  Lab 08/11/18 0322  NA 137  K 3.7  CL 105  CO2 26  GLUCOSE 86  BUN 8  CREATININE 0.57*  CALCIUM 8.9   GFR: Estimated Creatinine Clearance: 93.2 mL/min (A) (by C-G formula based on SCr of 0.57  mg/dL (L)). Liver Function Tests: No results for input(s): AST, ALT, ALKPHOS, BILITOT, PROT, ALBUMIN in the last 168 hours. No results for input(s): LIPASE, AMYLASE in the last 168 hours. No results for input(s): AMMONIA in the last 168 hours. Coagulation Profile: No results for input(s): INR, PROTIME in the last 168 hours. Cardiac Enzymes: No results for input(s): CKTOTAL, CKMB, CKMBINDEX, TROPONINI in the last 168 hours. BNP (last 3 results) No results for input(s): PROBNP in the last 8760 hours. HbA1C: No results for input(s): HGBA1C in the last 72 hours. CBG: No results for input(s): GLUCAP in the last 168 hours. Lipid Profile: No results for input(s): CHOL, HDL, LDLCALC, TRIG, CHOLHDL, LDLDIRECT in the last 72 hours. Thyroid Function Tests: No results for input(s): TSH, T4TOTAL, FREET4, T3FREE, THYROIDAB in the last 72 hours. Anemia Panel: Recent Labs    08/10/18 1815  FERRITIN 17*  TIBC 405  IRON 41*   Sepsis Labs: No results for input(s): PROCALCITON, LATICACIDVEN in the last 168 hours.  No results found for this or any previous visit (from the past 240 hour(s)).       Radiology Studies: No results found.      Scheduled Meds: . baclofen  20 mg Oral TID  . divalproex  500 mg Oral BID  . docusate sodium  100 mg Oral BID  . fesoterodine  4 mg Oral Daily  . hydrocortisone cream  1 application Topical QHS  . loratadine  10 mg Oral Daily  . pantoprazole  80 mg Oral Daily  . peg 3350 powder  0.5 kit Oral Once   And  . [START ON 08/12/2018] peg 3350 powder  0.5 kit Oral Once  . risperiDONE  2 mg Oral QHS  . tamsulosin  0.4 mg Oral QHS  . tiZANidine  2 mg Oral TID   Continuous Infusions:   LOS: 1 day    Time spent: 25 mins.More than 50% of that time was spent in counseling and/or coordination of care.      Shelly Coss, MD [facility] Pager [phone]  If 7PM-7AM, please contact night-coverage [email or web address] Password [facility] 08/11/2018,  2:17 PM

## 2018-08-11 NOTE — Progress Notes (Signed)
     [facility] Progress Note   Chief Complaint:    Anemia, gastric wall thickening on CT scan in October   SUBJECTIVE:    left shoulder bothered him during the night, has this at home sometimes. Otherwise no complaints   ASSESSMENT AND PLAN:   1. 58 yo male with gastric wall thickening on CT scan in Oct 2019. For EGD in am. Consent was signed in our office  2. Mild microcytic anemia.  -will prep today for colonoscopy tomorrow. Already consented in our office.   3. Chronically elevated alk phos.  -outpatient workup is appropriate  4. Chronic DVT of femoral vein.   5. Cerebral palsy / Quadriplegia. .   6. Asymptomatic cholelithiasis  Appreciate Hospitalist assistance with admission / care of patient  Appr  OBJECTIVE:     Vital signs in last 24 hours: Temp:  [97.9 F (36.6 C)-98.2 F (36.8 C)] 98.2 F (36.8 C) (03/17 0446) Pulse Rate:  [59-68] 68 (03/17 0446) Resp:  [16-20] 17 (03/17 0446) BP: (110-133)/(66-76) 110/66 (03/17 0446) SpO2:  [97 %-100 %] 98 % (03/17 0446) Weight:  [65.5 kg] 65.5 kg (03/16 1117) Last BM Date: 08/10/18 General:   Alert, male in NAD EENT:  Normal hearing, non icteric sclera, conjunctive pink.  Heart:  Regular rate and rhythm.   Pulm: Normal respiratory effort, lungs CTA bilaterally without wheezes or crackles. Abdomen:  Soft, nondistended, nontender.  Normal bowel sounds, Neurologic:  Alert and  oriented x4 Psych:  Pleasant, cooperative.  Normal mood and affect.   Intake/Output from previous day: 03/16 0701 - 03/17 0700 In: 600 [P.O.:600] Out: 800 [Urine:800] Intake/Output this shift: No intake/output data recorded.  Lab Results: Recent Labs    08/11/18 0322  WBC 7.2  HGB 10.2*  HCT 34.1*  PLT 328   BMET Recent Labs    08/11/18 0322  NA 137  K 3.7  CL 105  CO2 26  GLUCOSE 86  BUN 8  CREATININE 0.57*  CALCIUM 8.9    Active Problems:   Infantile cerebral palsy (HCC)   Person living in  residential institution   Quadriplegia (HCC)   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)   Pressure injury of skin   Gastric wall thickening   Alkaline phosphatase elevation   Cholelithiasis   Colon cancer screening   Microcytic anemia     LOS: 1 day   Jendaya Gossett ,NP 08/11/2018, 10:32 AM       

## 2018-08-11 NOTE — H&P (View-Only) (Signed)
     [facility] Progress Note   Chief Complaint:    Anemia, gastric wall thickening on CT scan in October   SUBJECTIVE:    left shoulder bothered him during the night, has this at home sometimes. Otherwise no complaints   ASSESSMENT AND PLAN:   1. 58 yo male with gastric wall thickening on CT scan in Oct 2019. For EGD in am. Consent was signed in our office  2. Mild microcytic anemia.  -will prep today for colonoscopy tomorrow. Already consented in our office.   3. Chronically elevated alk phos.  -outpatient workup is appropriate  4. Chronic DVT of femoral vein.   5. Cerebral palsy / Quadriplegia. Marland Kitchen   6. Asymptomatic cholelithiasis  Appreciate Hospitalist assistance with admission / care of patient  Appr  OBJECTIVE:     Vital signs in last 24 hours: Temp:  [97.9 F (36.6 C)-98.2 F (36.8 C)] 98.2 F (36.8 C) (03/17 0446) Pulse Rate:  [59-68] 68 (03/17 0446) Resp:  [16-20] 17 (03/17 0446) BP: (110-133)/(66-76) 110/66 (03/17 0446) SpO2:  [97 %-100 %] 98 % (03/17 0446) Weight:  [65.5 kg] 65.5 kg (03/16 1117) Last BM Date: 08/10/18 General:   Alert, male in NAD EENT:  Normal hearing, non icteric sclera, conjunctive pink.  Heart:  Regular rate and rhythm.   Pulm: Normal respiratory effort, lungs CTA bilaterally without wheezes or crackles. Abdomen:  Soft, nondistended, nontender.  Normal bowel sounds, Neurologic:  Alert and  oriented x4 Psych:  Pleasant, cooperative.  Normal mood and affect.   Intake/Output from previous day: 03/16 0701 - 03/17 0700 In: 600 [P.O.:600] Out: 800 [Urine:800] Intake/Output this shift: No intake/output data recorded.  Lab Results: Recent Labs    08/11/18 0322  WBC 7.2  HGB 10.2*  HCT 34.1*  PLT 328   BMET Recent Labs    08/11/18 0322  NA 137  K 3.7  CL 105  CO2 26  GLUCOSE 86  BUN 8  CREATININE 0.57*  CALCIUM 8.9    Active Problems:   Infantile cerebral palsy (HCC)   Person living in  residential institution   Quadriplegia [facility])   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)   Pressure injury of skin   Gastric wall thickening   Alkaline phosphatase elevation   Cholelithiasis   Colon cancer screening   Microcytic anemia     LOS: 1 day   [name] ,NP 08/11/2018, 10:32 AM

## 2018-08-11 NOTE — TOC Initial Note (Signed)
Transition of Care [facility]) - Initial/Assessment Note    Patient Details  Name: [name] MRN: [id number] Date of Birth: 06/29/1960  Transition [facility]) CM/SW Contact:    Nelwyn Salisbury, LCSW Phone Number: [phone] 08/11/2018, 2:04 PM  Clinical Narrative:   Pt admitted from [facility] group home on [address], admitted for planned endoscopy, colonoscopy.  Pt has cerebral palsy and IDD diagnoses, has resided at group home for about 29 years. Plan to return at DC. Sister and group home updated today. Sister to bring copy of legal guardianship paperwork for hospital chart.                Expected Discharge Plan: Group Home Barriers to Discharge: Continued Medical Work up   Patient Goals and CMS Choice Patient states their goals for this hospitalization and ongoing recovery are:: colonoscopy CMS Medicare.gov Compare Post Acute Care list provided to:: (NA) Choice offered to / list presented to : (NA)  Expected Discharge Plan and Services Expected Discharge Plan: Group Home   Post Acute Care Choice: (NA) Living arrangements for the past 2 months: Group Home Expected Discharge Date: (unknown)                        Prior Living Arrangements/Services Living arrangements for the past 2 months: Group Home Lives with:: Facility Resident Patient language and need for interpreter reviewed:: No Do you feel safe going back to the place where you live?: Yes      Need for Family Participation in Patient Care: Yes (Comment)(sister is legal guardian) Care giver support system in place?: Yes (comment)(group home)   Criminal Activity/Legal Involvement Pertinent to Current Situation/Hospitalization: No - Comment as needed  Activities of Daily Living Home Assistive Devices/Equipment: Other (Comment), Wheelchair, Grab bars around toilet, Grab bars in shower, Hand-held shower hose(electric wheelchair, ted hose, heel protectors) ADL Screening (condition at time of  admission) Patient's cognitive ability adequate to safely complete daily activities?: No Is the patient deaf or have difficulty hearing?: No Does the patient have difficulty seeing, even when wearing glasses/contacts?: No Does the patient have difficulty concentrating, remembering, or making decisions?: Yes Patient able to express need for assistance with ADLs?: Yes Does the patient have difficulty dressing or bathing?: Yes Independently performs ADLs?: No Communication: Independent Dressing (OT): Dependent Is this a change from baseline?: Pre-admission baseline Grooming: Dependent Is this a change from baseline?: Pre-admission baseline Feeding: Dependent Is this a change from baseline?: Pre-admission baseline Bathing: Dependent Is this a change from baseline?: Pre-admission baseline Toileting: Dependent Is this a change from baseline?: Pre-admission baseline In/Out Bed: Dependent Is this a change from baseline?: Pre-admission baseline Walks in Home: Dependent Is this a change from baseline?: Pre-admission baseline Does the patient have difficulty walking or climbing stairs?: Yes Weakness of Legs: Both Weakness of Arms/Hands: Both  Permission Sought/Granted Permission sought to share information with : Guardian, Magazine features editor Permission granted to share information with : Yes, Verbal Permission Granted  Share Information with NAME: sister/guardian [name] [phone]  Permission granted to share info w AGENCY: [facility]- [name], administrator [phone]        Emotional Assessment     Affect (typically observed): Calm Orientation: : Oriented to Self, Oriented to Place, Oriented to  Time Alcohol / Substance Use: Never Used Psych Involvement: No (comment)  Admission diagnosis:  Colon cancer screening Quadriplegia  Patient Active Problem List   Diagnosis Date Noted  . Pressure  injury of skin 08/10/2018  . Gastric wall thickening  08/10/2018  . Alkaline phosphatase elevation 08/10/2018  . Cholelithiasis 08/10/2018  . Colon cancer screening   . Microcytic anemia   . Screening for lipid disorders 06/04/2018  . Encounter for completion of form with patient 06/04/2018  . Hiatal hernia 04/07/2018  . Cerumen impaction 02/04/2017  . 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 Personal history of gastric ulcer 01/20/2016  . Palliative care encounter   . Quadriplegia (HCC) 12/15/2015  . Cerebral palsy, quadriplegic (HCC)   . Pre-diabetes 08/31/2015  . Foot ulcer, limited to breakdown of skin (HCC) 06/08/2015  . Constipation 11/01/2014  . Seasonal allergies 11/01/2014  . Pressure ulcer of foot 03/14/2014  . 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 Medication monitoring encounter 07/10/2011  . RENAL GLYCOSURIA 02/26/2010  . URINARY INCONTINENCE 02/09/2010  . Person living in residential institution 07/05/2009  . Mental retardation 07/24/2006  . Infantile cerebral palsy (HCC) 07/24/2006   PCP:  Joana Reamer, DO Pharmacy:   [facility] - [place], [place] - [address] [place] [address] Phone: [phone] Fax: [phone]     Social Determinants of Health (SDOH) Interventions    Readmission Risk Interventions 30 Day Unplanned Readmission Risk Score     Admission (Current) from 08/10/2018 in [facility]  30 Day Unplanned Readmission Risk Score (%)  6 Filed at 08/10/2018 1600     This score is the patient's risk of an unplanned readmission within 30 days of being discharged (0 -100%). The score is based on dignosis, age, lab data, medications, orders, and past utilization.   Low:  0-14.9   Medium: 15-21.9   High: 22-29.9   Extreme: 30 and above       No flowsheet data found.

## 2018-08-12 ENCOUNTER — Inpatient Hospital Stay (HOSPITAL_COMMUNITY): Payer: Medicare Other | Admitting: Anesthesiology

## 2018-08-12 ENCOUNTER — Encounter (HOSPITAL_COMMUNITY): Payer: Self-pay | Admitting: *Deleted

## 2018-08-12 ENCOUNTER — Encounter (HOSPITAL_COMMUNITY)
Admission: AD | Disposition: A | Discharge status: Home / Self Care | Payer: Self-pay | Source: Home / Self Care | Attending: Internal Medicine

## 2018-08-12 DIAGNOSIS — Q399 Congenital malformation of esophagus, unspecified: Secondary | ICD-10-CM

## 2018-08-12 DIAGNOSIS — K2970 Gastritis, unspecified, without bleeding: Principal | ICD-10-CM

## 2018-08-12 DIAGNOSIS — Z980 Intestinal bypass and anastomosis status: Secondary | ICD-10-CM

## 2018-08-12 DIAGNOSIS — R748 Abnormal levels of other serum enzymes: Secondary | ICD-10-CM

## 2018-08-12 DIAGNOSIS — I82512 Chronic embolism and thrombosis of left femoral vein: Secondary | ICD-10-CM

## 2018-08-12 HISTORY — PX: BIOPSY: SHX5522

## 2018-08-12 HISTORY — PX: ESOPHAGOGASTRODUODENOSCOPY (EGD) WITH PROPOFOL: SHX5813

## 2018-08-12 LAB — CBC WITH DIFFERENTIAL/PLATELET
ABS IMMATURE GRANULOCYTES: 0.04 10*3/uL (ref 0.00–0.07)
Basophils Absolute: 0.1 10*3/uL (ref 0.0–0.1)
Basophils Relative: 1 %
Eosinophils Absolute: 0.3 10*3/uL (ref 0.0–0.5)
Eosinophils Relative: 4 %
HCT: 38 % — ABNORMAL LOW (ref 39.0–52.0)
Hemoglobin: 11.6 g/dL — ABNORMAL LOW (ref 13.0–17.0)
Immature Granulocytes: 1 %
Lymphocytes Relative: 27 %
Lymphs Abs: 2 10*3/uL (ref 0.7–4.0)
MCH: 23.8 pg — ABNORMAL LOW (ref 26.0–34.0)
MCHC: 30.5 g/dL (ref 30.0–36.0)
MCV: 77.9 fL — AB (ref 80.0–100.0)
MONOS PCT: 10 %
Monocytes Absolute: 0.7 10*3/uL (ref 0.1–1.0)
Neutro Abs: 4.3 10*3/uL (ref 1.7–7.7)
Neutrophils Relative %: 57 %
Platelets: 376 10*3/uL (ref 150–400)
RBC: 4.88 MIL/uL (ref 4.22–5.81)
RDW: 19.9 % — ABNORMAL HIGH (ref 11.5–15.5)
WBC: 7.4 10*3/uL (ref 4.0–10.5)
nRBC: 0 % (ref 0.0–0.2)

## 2018-08-12 LAB — BASIC METABOLIC PANEL
Anion gap: 10 (ref 5–15)
BUN: 5 mg/dL — ABNORMAL LOW (ref 6–20)
CHLORIDE: 107 mmol/L (ref 98–111)
CO2: 26 mmol/L (ref 22–32)
Calcium: 9.2 mg/dL (ref 8.9–10.3)
Creatinine, Ser: 0.52 mg/dL — ABNORMAL LOW (ref 0.61–1.24)
GFR calc Af Amer: >60 mL/min (ref >60)
GFR calc non Af Amer: >60 mL/min (ref >60)
Glucose, Bld: 87 mg/dL (ref 70–99)
Potassium: 4.2 mmol/L (ref 3.5–5.1)
Sodium: 143 mmol/L (ref 135–145)

## 2018-08-12 LAB — SURGICAL PCR SCREEN
MRSA, PCR: POSITIVE — AB
Staphylococcus aureus: POSITIVE — AB

## 2018-08-12 LAB — MAGNESIUM: Magnesium: 2.2 mg/dL (ref 1.7–2.4)

## 2018-08-12 SURGERY — ESOPHAGOGASTRODUODENOSCOPY (EGD) WITH PROPOFOL
Anesthesia: Monitor Anesthesia Care

## 2018-08-12 MED ORDER — PEG-KCL-NACL-NASULF-NA ASC-C 100 G PO SOLR: 1.0000 | Freq: Once | ORAL | Status: DC

## 2018-08-12 MED ORDER — PEG-KCL-NACL-NASULF-NA ASC-C 100 G PO SOLR
0.5000 | Freq: Once | ORAL | Status: AC
  Administered 2018-08-13: 100 g via ORAL
  Filled 2018-08-12: qty 1

## 2018-08-12 MED ORDER — PHENYLEPHRINE 40 MCG/ML (10ML) SYRINGE FOR IV PUSH (FOR BLOOD PRESSURE SUPPORT)
PREFILLED_SYRINGE | INTRAVENOUS | Status: DC | PRN
  Administered 2018-08-12 (×2): 40 ug via INTRAVENOUS

## 2018-08-12 MED ORDER — LACTATED RINGERS IV SOLN
INTRAVENOUS | Status: DC | PRN
  Administered 2018-08-12: 12:00:00 via INTRAVENOUS

## 2018-08-12 MED ORDER — LIDOCAINE 2% (20 MG/ML) 5 ML SYRINGE
INTRAMUSCULAR | Status: DC | PRN
  Administered 2018-08-12: 40 mg via INTRAVENOUS

## 2018-08-12 MED ORDER — PROPOFOL 500 MG/50ML IV EMUL
INTRAVENOUS | Status: DC | PRN
  Administered 2018-08-12: 75 ug/kg/min via INTRAVENOUS

## 2018-08-12 MED ORDER — PEG-KCL-NACL-NASULF-NA ASC-C 100 G PO SOLR
0.5000 | Freq: Once | ORAL | Status: AC
  Administered 2018-08-12: 100 g via ORAL
  Filled 2018-08-12: qty 1

## 2018-08-12 MED ORDER — BISACODYL 5 MG PO TBEC
10.0000 mg | DELAYED_RELEASE_TABLET | Freq: Once | ORAL | Status: AC
  Administered 2018-08-12: 10 mg via ORAL
  Filled 2018-08-12: qty 2

## 2018-08-12 SURGICAL SUPPLY — 25 items

## 2018-08-12 NOTE — Anesthesia Procedure Notes (Signed)
Procedure Name: MAC Performed by: Eben Burow, CRNA Pre-anesthesia Checklist: Patient identified, Emergency Drugs available, Suction available, Patient being monitored and Timeout performed Oxygen Delivery Method: Nasal cannula Dental Injury: Teeth and Oropharynx as per pre-operative assessment

## 2018-08-12 NOTE — Anesthesia Preprocedure Evaluation (Addendum)
Anesthesia Evaluation  Patient identified by MRN, date of birth, ID band Patient awake    Reviewed: Allergy & Precautions, NPO status , Patient's Chart, lab work & pertinent test results  Airway Mallampati: II  TM Distance: >3 FB Neck ROM: Full    Dental no notable dental hx. (+) Teeth Intact   Pulmonary    Pulmonary exam normal breath sounds clear to auscultation       Cardiovascular + DVT  Normal cardiovascular exam Rhythm:Regular Rate:Normal     Neuro/Psych PSYCHIATRIC DISORDERS Anxiety Cerbral Palsy Spastic Quadaplegia    GI/Hepatic Neg liver ROS, PUD, GERD  ,  Endo/Other    Renal/GU K+ 3.7 Cr 0.57     Musculoskeletal  (+) Arthritis ,   Abdominal   Peds  Hematology  (+) anemia , Hgb 10.2   Anesthesia Other Findings   Reproductive/Obstetrics                            Anesthesia Physical Anesthesia Plan  ASA: III  Anesthesia Plan: MAC   Post-op Pain Management:    Induction: Intravenous  PONV Risk Score and Plan: 3 and Treatment may vary due to age or medical condition, Ondansetron and Dexamethasone  Airway Management Planned: Natural Airway and Nasal Cannula  Additional Equipment:   Intra-op Plan:   Post-operative Plan:   Informed Consent: I have reviewed the patients History and Physical, chart, labs and discussed the procedure including the risks, benefits and alternatives for the proposed anesthesia with the patient or authorized representative who has indicated his/her understanding and acceptance.     Dental advisory given  Plan Discussed with:   Anesthesia Plan Comments:       Anesthesia Quick Evaluation

## 2018-08-12 NOTE — Progress Notes (Signed)
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  Due to patient's mental issue and inability for  bowel preparation by himself he needed to be  Admitted .  Plan is to discharge him back to group home after EGD /colonoscopy   08/12/2018.   Assessment & Plan:   Active Problems:   Infantile cerebral palsy (HCC)   Person living in residential institution   Quadriplegia [facility])   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)   Pressure injury of skin   Gastric wall thickening   Alkaline phosphatase elevation   Cholelithiasis   Colon cancer screening   Microcytic anemia  1 gastric wall thickening per CT scan Patient was admitted for upper endoscopy and colonoscopy per GI.  Patient in PACU underwent upper endoscopy which showed moderate large hiatal hernia with slipped Nissen fundoplication.  Patent Billroth II gastrojejunostomy.  Healthy mucosa with moderate gastritis of gastric remnant.  Colonoscopy with unsatisfactory prep.  Patient placed on clear liquids per GI.  Continue PPI.  Per GI.  2.  Microcytic anemia Patient had colonoscopy with unsatisfactory prep.  Anemia panel with iron level of 41, ferritin of 17.  Hemoglobin currently at 11.6.  Patient underwent upper endoscopy which showed moderate large hiatal hernia with slipped Nissen fundoplication.  Patent Billroth II gastrojejunostomy.  Healthy mucosa with moderate gastritis of the gastric remnant noted.  Colonoscopy with unsatisfactory prep.  Per GI.  3.  Chronically elevated alk phosphatase Outpatient follow-up.  Patient with chronic asymptomatic cholelithiasis.  4.  Chronic DVT of the femoral  vein.   Not on anticoagulation.  Outpatient follow-up.  5.  Cerebral palsy/quadriplegia Patient lives in a group home.  Continue Depakote and Resporal.  Continue supportive care.  6.  Sacral/heel ulcers Present on admission.  Heel floaters.  Supportive care.   DVT prophylaxis: SCDs Code Status: DNR Family Communication: Updated patient.  No family at bedside. Disposition Plan: Back to group home when okay with gastroenterology.   Consultants:   Gastroenterology: Dr. [name] 08/11/2018  Procedures:  Upper endoscopy 08/12/2018 per Dr. [name]  Attempted colonoscopy 08/12/2018 per Dr. [name]  Antimicrobials:   None   Subjective: Patient in PACU after colonoscopy.  Denies any chest pain or shortness of breath.  Objective: Vitals:   08/12/18 1256 08/12/18 1300 08/12/18 1310 08/12/18 1350  BP: 115/66 120/66 126/72 138/74  Pulse: 73 72 72 68  Resp: _0 Temp: (!) 97.5 F (36.4 C)   98.1 F (36.7 C)  TempSrc: Oral   Oral  SpO2: 100% 99% 99% 100%  Weight:      Height:        Intake/Output Summary (Last 24 hours) at 08/12/2018 1946 Last data filed at 08/12/2018 1709 Gross per 24 hour  Intake 3200 ml  Output 2078 ml  Net 1122 ml   Filed Weights   08/10/18 1117 08/12/18 1136  Weight: 65.5 kg 65.5 kg    Examination:  General exam: Appears calm and comfortable  Respiratory system: Clear to auscultation. Respiratory effort normal. Cardiovascular system: S1 & S2 heard, RRR. No JVD, murmurs, rubs, gallops or clicks. No pedal edema. Gastrointestinal system: Abdomen is nondistended,  soft and nontender. No organomegaly or masses felt. Normal bowel sounds heard. Central nervous system: Alert and oriented.  Quadriplegia.  No focal neurological deficits. Extremities: Symmetric 5 x 5 power. Skin: Sacral and heel ulcers noted present on admission.  Psychiatry: Judgement and insight appear normal. Mood & affect appropriate.     Data Reviewed: I have personally reviewed  following labs and imaging studies  CBC: Recent Labs  Lab 08/11/18 0322 08/12/18 0915  WBC 7.2 7.4  NEUTROABS  --  4.3  HGB 10.2* 11.6*  HCT 34.1* 38.0*  MCV 79.9* 77.9*  PLT 328 982   Basic Metabolic Panel: Recent Labs  Lab 08/11/18 0322 08/12/18 0915  NA 137 143  K 3.7 4.2  CL 105 107  CO2 26 26  GLUCOSE 86 87  BUN 8 5*  CREATININE 0.57* 0.52*  CALCIUM 8.9 9.2  MG  --  2.2   GFR: Estimated Creatinine Clearance: 93.2 mL/min (A) (by C-G formula based on SCr of 0.52 mg/dL (L)). Liver Function Tests: No results for input(s): AST, ALT, ALKPHOS, BILITOT, PROT, ALBUMIN in the last 168 hours. No results for input(s): LIPASE, AMYLASE in the last 168 hours. No results for input(s): AMMONIA in the last 168 hours. Coagulation Profile: No results for input(s): INR, PROTIME in the last 168 hours. Cardiac Enzymes: No results for input(s): CKTOTAL, CKMB, CKMBINDEX, TROPONINI in the last 168 hours. BNP (last 3 results) No results for input(s): PROBNP in the last 8760 hours. HbA1C: No results for input(s): HGBA1C in the last 72 hours. CBG: No results for input(s): GLUCAP in the last 168 hours. Lipid Profile: No results for input(s): CHOL, HDL, LDLCALC, TRIG, CHOLHDL, LDLDIRECT in the last 72 hours. Thyroid Function Tests: No results for input(s): TSH, T4TOTAL, FREET4, T3FREE, THYROIDAB in the last 72 hours. Anemia Panel: Recent Labs    08/10/18 1815  FERRITIN 17*  TIBC 405  IRON 41*   Sepsis Labs: No results for input(s): PROCALCITON, LATICACIDVEN in the last 168 hours.  Recent Results (from the past 240 hour(s))  Surgical pcr screen     Status: Abnormal   Collection Time: 08/12/18  6:00 AM  Result Value Ref Range Status   MRSA, PCR POSITIVE (A) NEGATIVE Final    Comment: RESULT CALLED TO, READ BACK BY AND VERIFIED WITH: RUSSELL,J. RN AT 6415 08/12/18 MULLINS,T    Staphylococcus aureus POSITIVE (A) NEGATIVE Final    Comment: RESULT CALLED TO, READ BACK BY AND  VERIFIED WITH: RUSSELL,J. RN AT 8309 08/12/18 MULLINS,T (NOTE) The Xpert SA Assay (FDA approved for NASAL specimens in patients 59 years of age and older), is one component of a comprehensive surveillance program. It is not intended to diagnose infection nor to guide or monitor treatment. Performed at [facility], [place] [address], [place], [place] [address]          Radiology Studies: No results found.      Scheduled Meds: . baclofen  20 mg Oral TID  . divalproex  500 mg Oral BID  . docusate sodium  100 mg Oral BID  . fesoterodine  4 mg Oral Daily  . hydrocortisone cream  1 application Topical QHS  . loratadine  10 mg Oral Daily  . pantoprazole  80 mg Oral Daily  . [START ON 08/13/2018] peg 3350 powder  0.5 kit Oral Once  . risperiDONE  2 mg Oral QHS  . tamsulosin  0.4 mg Oral QHS  . tiZANidine  2 mg Oral TID  Continuous Infusions:   LOS: 2 days    Time spent: 35 minutes    Irine Seal, MD [facility]  If 7PM-7AM, please contact night-coverage [email or web address] 08/12/2018, 7:46 PM

## 2018-08-12 NOTE — Transfer of Care (Signed)
Immediate Anesthesia Transfer of Care Note  Patient: [name]  Procedure(s) Performed: ESOPHAGOGASTRODUODENOSCOPY (EGD) WITH PROPOFOL (N/A ) BIOPSY ABORTED FLEX  Patient Location: Endoscopy Unit PACU  Anesthesia Type:MAC  Level of Consciousness: awake and alert   Airway & Oxygen Therapy: Patient Spontanous Breathing and Patient connected to nasal cannula oxygen  Post-op Assessment: Report given to RN and Post -op Vital signs reviewed and stable  Post vital signs: Reviewed and stable  Last Vitals:  Vitals Value Taken Time  BP 120/66 08/12/2018  1:00 PM  Temp    Pulse 73 08/12/2018  1:00 PM  Resp 13 08/12/2018  1:00 PM  SpO2 98 % 08/12/2018  1:00 PM  Vitals shown include unvalidated device data.  Last Pain:  Vitals:   08/12/18 1256  TempSrc: Oral  PainSc: 0-No pain         Complications: No apparent anesthesia complications

## 2018-08-12 NOTE — Op Note (Signed)
[facility] Patient Name: [name] Procedure Date: 08/12/2018 MRN: [id number] Attending MD: Lynann Bologna , MD Date of Birth: 12-12-1960 CSN: [id number] Age: 58 Admit Type: Inpatient Procedure:                Upper GI endoscopy Indications:              Iron deficiency anemia, abnormal CT and gastric                            wall thickening Providers:                Lynann Bologna, MD, Norman Clay, RN, Zoila Shutter,                            Technician Referring MD:              Medicines:                Monitored Anesthesia Care Complications:            No immediate complications. Estimated Blood Loss:     Estimated blood loss: none. Procedure:                Pre-Anesthesia Assessment:                           - Prior to the procedure, a History and Physical                            was performed, and patient medications and                            allergies were reviewed. The patient's tolerance of                            previous anesthesia was also reviewed. The risks                            and benefits of the procedure and the sedation                            options and risks were discussed with the patient.                            All questions were answered, and informed consent                            was obtained. Prior Anticoagulants: The patient has                            taken no previous anticoagulant or antiplatelet                            agents. ASA Grade Assessment: III - A patient with                            severe  systemic disease. After reviewing the risks                            and benefits, the patient was deemed in                            satisfactory condition to undergo the procedure.                           After obtaining informed consent, the endoscope was                            passed under direct vision. Throughout the                            procedure, the patient's blood pressure,  pulse, and                            oxygen saturations were monitored continuously. The                            GIF-H190 ([id number]) Olympus gastroscope was                            introduced through the mouth, and advanced to the                            third part of duodenum. The upper GI endoscopy was                            accomplished without difficulty. The patient                            tolerated the procedure well. Scope In: Scope Out: Findings:      The lower third of the esophagus was mildly tortuous and foreshortened       d/t HH.      A medium to large hiatal hernia was present extending from 35 cm (GE       junction) to 42 cm (diaphragmatic hiatus). There was also evidence of       previous Nissen's fundoplication. Examination of the hiatal hernia was       limited due to presence of thick mucus and contrast/barium.      Evidence of a patent Billroth II gastrojejunostomy was found. The       gastrojejunal anastomosis was characterized by healthy appearing mucosa.       This was traversed. The efferent limb was examined. The afferent limb       was examined. Biopsies for histology were taken with a cold forceps for       evaluation of celiac disease.      Localized moderate inflammation was found in the gastric remnant.       Biopsies were taken with a cold forceps for histology. Estimated blood       loss was minimal. Impression:               - Moderate-large hiatal hernia  with slipped                            Nissen's fundoplication.                           - Patent Billroth II gastrojejunostomy was found,                            characterized by healthy appearing mucosa.                           - Moderate gastritis of the gastric remnant.                            (Biopsied). Moderate Sedation:      none Recommendation:           - Patient has a contact number available for                            emergencies. The signs and symptoms of  potential                            delayed complications were discussed with the                            patient. Return to normal activities tomorrow.                            Written discharge instructions were provided to the                            patient.                           - Resume previous diet.                           - Continue Protonix 40 mg p.o. twice daily.                           - Avoid nonsteroidals.                           - Await pathology results.                           - If he continues to be anemic despite iron                            supplements, consider IV Feraheme                           - Return to GI clinic in 8 weeks. Procedure Code(s):        --- Professional ---  16109, Esophagogastroduodenoscopy, flexible,                            transoral; with biopsy, single or multiple Diagnosis Code(s):        --- Professional ---                           Q39.9, Congenital malformation of esophagus,                            unspecified                           K44.9, Diaphragmatic hernia without obstruction or                            gangrene                           Z98.0, Intestinal bypass and anastomosis status                           K29.70, Gastritis, unspecified, without bleeding                           D50.9, Iron deficiency anemia, unspecified CPT copyright 2018 American Medical Association. All rights reserved. The codes documented in this report are preliminary and upon coder review may  be revised to meet current compliance requirements. Lynann Bologna, MD 08/12/2018 12:57:45 PM This report has been signed electronically. Number of Addenda: 0

## 2018-08-12 NOTE — Interval H&P Note (Signed)
History and Physical Interval Note:  08/12/2018 12:12 PM  [name]  has presented today for surgery, with the diagnosis of colon cancer screening, quadriplegic, abnormal ct scan/db.  The various methods of treatment have been discussed with the patient and family. After consideration of risks, benefits and other options for treatment, the patient has consented to  Procedure(s): COLONOSCOPY WITH PROPOFOL (N/A) ESOPHAGOGASTRODUODENOSCOPY (EGD) WITH PROPOFOL (N/A) as a surgical intervention.  The patient's history has been reviewed, patient examined, no change in status, stable for surgery.  I have reviewed the patient's chart and labs.  Questions were answered to the patient's satisfaction.     Lynann Bologna

## 2018-08-12 NOTE — Op Note (Signed)
[facility] Patient Name: Jeff Frieden Procedure Date: 08/12/2018 MRN: [id number] Attending MD: Lynann Bologna , MD Date of Birth: Nov 27, 1960 CSN: [id number] Age: 58 Admit Type: Inpatient Procedure:                Attempted colonoscopy (stopped due to poor                            preparation) Indications:              Iron deficiency anemia Providers:                Lynann Bologna, MD, Norman Clay, RN, Zoila Shutter,                            Technician Referring MD:              Medicines:                Monitored Anesthesia Care Complications:            No immediate complications. Estimated Blood Loss:     Estimated blood loss: none. Procedure:                Pre-Anesthesia Assessment:                           - Prior to the procedure, a History and Physical                            was performed, and patient medications and                            allergies were reviewed. The patient's tolerance of                            previous anesthesia was also reviewed. The risks                            and benefits of the procedure and the sedation                            options and risks were discussed with the patient.                            All questions were answered, and informed consent                            was obtained. Prior Anticoagulants: The patient has                            taken no previous anticoagulant or antiplatelet                            agents. ASA Grade Assessment: III - A patient with                            severe systemic disease.  After reviewing the risks                            and benefits, the patient was deemed in                            satisfactory condition to undergo the procedure.                           After obtaining informed consent, the colonoscope                            was passed under direct vision. Throughout the                            procedure, the patient's blood pressure,  pulse, and                            oxygen saturations were monitored continuously. The                            [id number]-H190DL ([id number]) Olympus pediatric colonscope                            was introduced through the anus with the intention                            of advancing to the cecum. The scope was advanced                            to the sigmoid colon before the procedure was                            aborted. Medications were given. The quality of the                            bowel preparation was inadequate and unsatisfactory. Scope In: 12:33:48 PM Scope Out: 12:38:15 PM Total Procedure Duration: 0 hours 4 minutes 27 seconds  Findings:      The perianal and digital rectal examinations were normal.      Solid stool was encountered throughout. It could not be cleared despite       aggressive suctioning and aspiration. Hence, we decided to hold off on       colonoscopy. Impression:               - Preparation of the colon was unsatisfactory. Moderate Sedation:      Not Applicable - Patient had care per Anesthesia. Recommendation:           - Return patient to hospital ward for ongoing care.                           - Clear liquid diet.                           - Continue present medications.                           -  Reprep and repeat colonoscopy tomorrow. Discussed                            with Gunnar Fusi. Procedure Code(s):        --- Professional ---                           [phone], 53, Colonoscopy, flexible; diagnostic,                            including collection of specimen(s) by brushing or                            washing, when performed (separate procedure) Diagnosis Code(s):        --- Professional ---                           D50.9, Iron deficiency anemia, unspecified CPT copyright 2018 American Medical Association. All rights reserved. The codes documented in this report are preliminary and upon coder review may  be revised to meet current  compliance requirements. Lynann Bologna, MD 08/12/2018 1:04:44 PM This report has been signed electronically. Number of Addenda: 0

## 2018-08-12 NOTE — Anesthesia Postprocedure Evaluation (Signed)
Anesthesia Post Note  Patient: [name]  Procedure(s) Performed: ESOPHAGOGASTRODUODENOSCOPY (EGD) WITH PROPOFOL (N/A ) BIOPSY ABORTED FLEX     Patient location during evaluation: Endoscopy Anesthesia Type: MAC Level of consciousness: awake and alert Pain management: pain level controlled Vital Signs Assessment: post-procedure vital signs reviewed and stable Respiratory status: spontaneous breathing, nonlabored ventilation, respiratory function stable and patient connected to nasal cannula oxygen Cardiovascular status: blood pressure returned to baseline and stable Postop Assessment: no apparent nausea or vomiting Anesthetic complications: no    Last Vitals:  Vitals:   08/12/18 1310 08/12/18 1350  BP: 126/72 138/74  Pulse: 72 68  Resp: 12 16  Temp:  36.7 C  SpO2: 99% 100%    Last Pain:  Vitals:   08/12/18 1350  TempSrc: Oral  PainSc:                  Barnet Glasgow

## 2018-08-13 ENCOUNTER — Encounter: Payer: Self-pay | Admitting: Family Medicine

## 2018-08-13 DIAGNOSIS — D509 Iron deficiency anemia, unspecified: Secondary | ICD-10-CM

## 2018-08-13 LAB — CBC WITH DIFFERENTIAL/PLATELET
ABS IMMATURE GRANULOCYTES: 0.05 10*3/uL (ref 0.00–0.07)
Basophils Absolute: 0.1 10*3/uL (ref 0.0–0.1)
Basophils Relative: 1 %
EOS ABS: 0.2 10*3/uL (ref 0.0–0.5)
Eosinophils Relative: 2 %
HCT: 39.7 % (ref 39.0–52.0)
Hemoglobin: 11.9 g/dL — ABNORMAL LOW (ref 13.0–17.0)
Immature Granulocytes: 1 %
Lymphocytes Relative: 35 %
Lymphs Abs: 2.9 10*3/uL (ref 0.7–4.0)
MCH: 23.8 pg — ABNORMAL LOW (ref 26.0–34.0)
MCHC: 30 g/dL (ref 30.0–36.0)
MCV: 79.6 fL — AB (ref 80.0–100.0)
Monocytes Absolute: 0.8 10*3/uL (ref 0.1–1.0)
Monocytes Relative: 10 %
Neutro Abs: 4.4 10*3/uL (ref 1.7–7.7)
Neutrophils Relative %: 51 %
Platelets: 373 10*3/uL (ref 150–400)
RBC: 4.99 MIL/uL (ref 4.22–5.81)
RDW: 19.9 % — AB (ref 11.5–15.5)
WBC: 8.4 10*3/uL (ref 4.0–10.5)
nRBC: 0 % (ref 0.0–0.2)

## 2018-08-13 LAB — BASIC METABOLIC PANEL
ANION GAP: 8 (ref 5–15)
BUN: <5 mg/dL — ABNORMAL LOW (ref 6–20)
CO2: 25 mmol/L (ref 22–32)
Calcium: 9.5 mg/dL (ref 8.9–10.3)
Chloride: 108 mmol/L (ref 98–111)
Creatinine, Ser: 0.54 mg/dL — ABNORMAL LOW (ref 0.61–1.24)
GFR calc Af Amer: >60 mL/min (ref >60)
GFR calc non Af Amer: >60 mL/min (ref >60)
GLUCOSE: 92 mg/dL (ref 70–99)
Potassium: 4.4 mmol/L (ref 3.5–5.1)
Sodium: 141 mmol/L (ref 135–145)

## 2018-08-13 MED ORDER — PEG-KCL-NACL-NASULF-NA ASC-C 100 G PO SOLR
0.5000 | Freq: Once | ORAL | Status: AC
  Administered 2018-08-13: 100 g via ORAL
  Filled 2018-08-13: qty 1

## 2018-08-13 MED ORDER — PEG-KCL-NACL-NASULF-NA ASC-C 100 G PO SOLR: 1.0000 | Freq: Once | ORAL | Status: DC

## 2018-08-13 NOTE — Care Management Important Message (Signed)
Important Message  Patient Details  Name: [name] MRN: [id number] Date of Birth: March 14, 1961   Medicare Important Message Given:  Yes    Caren Macadam 08/13/2018, 10:12 AMImportant Message  Patient Details  Name: [name] MRN: [id number] Date of Birth: [birth year]   Medicare Important Message Given:  Yes    Caren Macadam 08/13/2018, 10:12 AM

## 2018-08-13 NOTE — Progress Notes (Addendum)
     [facility] Progress Note  CC:  Anemia, gastric wall thickening on CT 02/2018  Subjective: He complains of mild central lower abdominal pain. He tolerated Movi prep without N/V.   Objective:  Vital signs in last 24 hours: Temp:  [97.5 F (36.4 C)-98.5 F (36.9 C)] 98.5 F (36.9 C) (03/19 0518) Pulse Rate:  [68-73] 72 (03/19 0518) Resp:  [12-19] 17 (03/19 0518) BP: (115-138)/(64-86) 118/86 (03/19 0518) SpO2:  [97 %-100 %] 99 % (03/19 0518) Weight:  [65.5 kg] 65.5 kg (03/18 1136) Last BM Date: 08/13/18 General:  Alert and talkative in NAD  Heart:  Regular rate and rhythm; no murmurs Pulm: Diminished breathsound right lung fields, diminished LLL, no wheezing, rhonchi or crackles. Abdomen: Soft, nondistended, mild tenderness central lower abdomen/suprapubic area without rebound or guarding, right of midline vertical scar, LLQ scar intact.   Extremities:  Trace edema to upper and lower extremities, LLE > RLE, contractures to all 4 extremities Neurologic:  Alert and  oriented x2;   Psych:  Alert and cooperative. Normal mood and affect.  Intake/Output from previous day: 03/18 0701 - 03/19 0700 In: 2340 [P.O.:1540; I.V.:800] Out: 2250 [Urine:1950; Stool:300] Intake/Output this shift: No intake/output data recorded.  Lab Results: Recent Labs    08/11/18 0322 08/12/18 0915 08/13/18 0342  WBC 7.2 7.4 8.4  HGB 10.2* 11.6* 11.9*  HCT 34.1* 38.0* 39.7  PLT 328 376 373   BMET Recent Labs    08/11/18 0322 08/12/18 0915 08/13/18 0342  NA 137 143 141  K 3.7 4.2 4.4  CL 105 107 108  CO2 _0 GLUCOSE 86 87 92  BUN 8 5* <5*  CREATININE 0.57* 0.52* 0.54*  CALCIUM 8.9 9.2 9.5    Assessment / Plan:  1. 58 y.o. male with quadriparesis secondary to CP with microcytic anemia and gastric wall thickening per CT. EGD 3/18 showed the lower 1/3 of esophagus was tortuous, med to lg HH, evidence of past NF and Billroth II gastrojejunostomy, inflammation to the  gastric remnant. Colonoscopy was not done due to poor prep. Patient was re-prepped 3/18 for colonoscopy 3/19.  Today's colonoscopy cancelled due to inadequate prep, fecal bag continues to drain brown liquid with sediment. -clear liquids today then NPO after midnight -re prep with Movi prep today -colonoscopy rescheduled 08/14/2018  2. Mild microcytic anemia. Hg 11.9 >> 11.6. HCT 39.7 >>39. MCV 79.6. -will prep today for colonoscopy tomorrow. Already consented in our office.   3. Chronically elevated alk phos 174.  Normal AST/ALT -evaluate as outpatient   4. Chronic DVT of femoral vein.   5. Cerebral palsy / Quadriplegia. Marland Kitchen   6. Asymptomatic cholelithiasis    LOS: 3 days   [name]  08/13/2018, 8:50 AM     Attending Physician Note   I have taken an interval history, reviewed the chart and examined the patient. I agree with the Advanced Practitioner's note, impression and recommendations.   Iron deficiency anemia requiring further evaluation with colonoscopy. EGD report reviewed.  Inadequate bowel prep over night. Repeat bowel prep today and reschedule colonoscopy with Dr. [name] at [place] tomorrow.   Lucio Edward, MD FACG [phone]

## 2018-08-13 NOTE — Progress Notes (Signed)
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  Due to patient's mental issue and inability for  bowel preparation by himself he needed to be  Admitted .  Plan is to discharge him back to group home after EGD /colonoscopy   08/12/2018.   Assessment & Plan:   Active Problems:   Infantile cerebral palsy (HCC)   Person living in residential institution   Quadriplegia [facility])   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)   Pressure injury of skin   Gastric wall thickening   Alkaline phosphatase elevation   Cholelithiasis   Colon cancer screening   Microcytic anemia  1 gastric wall thickening per CT scan Patient was admitted for upper endoscopy and colonoscopy per GI.  Patient in PACU underwent upper endoscopy which showed moderate large hiatal hernia with slipped Nissen fundoplication.  Patent Billroth II gastrojejunostomy.  Healthy mucosa with moderate gastritis of gastric remnant.  Colonoscopy with unsatisfactory prep x 2.  Patient placed on clear liquids per GI.  Continue PPI.  Patient for probable colonoscopy tomorrow.  Per GI.  2.  Microcytic anemia Patient had colonoscopy with unsatisfactory prep.  Anemia panel with iron level of 41, ferritin of 17.  Hemoglobin currently at 11.6.  Patient underwent upper endoscopy which showed moderate large hiatal hernia with slipped Nissen fundoplication.  Patent Billroth II gastrojejunostomy.  Healthy mucosa with moderate gastritis of the gastric remnant noted.  Colonoscopy with unsatisfactory prep x2 days now.  Patient to be prepped tonight for probable colonoscopy tomorrow 08/14/2018.  Per GI.  3.   Chronically elevated alk phosphatase Outpatient follow-up.  Patient with chronic asymptomatic cholelithiasis.  4.  Chronic DVT of the femoral vein.   Not on anticoagulation.  Outpatient follow-up.  5.  Cerebral palsy/quadriplegia Patient lives in a group home.  Continue Risperdal and Depakote.  Supportive care.   6.  Sacral/bilateral heel ulcers Present on admission.  Heel floaters.  Supportive care.   DVT prophylaxis: SCDs Code Status: DNR Family Communication: Updated patient.  No family at bedside. Disposition Plan: Back to group home when okay with gastroenterology.   Consultants:   Gastroenterology: Dr. [name] 08/11/2018  Procedures:  Upper endoscopy 08/12/2018 per Dr. [name]  Attempted colonoscopy 08/12/2018 per Dr. [name]  Antimicrobials:   None   Subjective: Patient laying in bed.  Patient on clear liquids.  Patient wanting to eat.  No chest pain.  No shortness of breath.   Objective: Vitals:   08/12/18 1350 08/12/18 2333 08/13/18 0518 08/13/18 1316  BP: 138/74 127/76 118/86 132/80  Pulse: 68 71 72 70  Resp: _0 Temp: 98.1 F (36.7 C) 98.4 F (36.9 C) 98.5 F (36.9 C) 97.9 F (36.6 C)  TempSrc: Oral Oral Oral Oral  SpO2: 100% 97% 99% 99%  Weight:      Height:        Intake/Output Summary (Last 24 hours) at 08/13/2018 1321 Last data filed at 08/13/2018 0651 Gross per 24 hour  Intake 1540 ml  Output 1975 ml  Net -435 ml   Filed Weights   08/10/18 1117 08/12/18 1136  Weight: 65.5 kg 65.5 kg    Examination:  General exam: NAD Respiratory system: CTAB.  Respiratory effort normal. Cardiovascular system: Regular rate rhythm no murmurs rubs or gallops.  No JVD.  No lower extremity edema.  Gastrointestinal system: Abdomen is soft, nontender, nondistended, positive bowel sounds.  No rebound.  No guarding.  Central nervous system: Alert and oriented.  Quadriplegia.  No focal neurological deficits. Extremities: Symmetric 5 x 5 power. Skin: Sacral  and heel ulcers noted present on admission.  Psychiatry: Judgement and insight appear normal. Mood & affect appropriate.     Data Reviewed: I have personally reviewed following labs and imaging studies  CBC: Recent Labs  Lab 08/11/18 0322 08/12/18 0915 08/13/18 0342  WBC 7.2 7.4 8.4  NEUTROABS  --  4.3 4.4  HGB 10.2* 11.6* 11.9*  HCT 34.1* 38.0* 39.7  MCV 79.9* 77.9* 79.6*  PLT 328 376 409   Basic Metabolic Panel: Recent Labs  Lab 08/11/18 0322 08/12/18 0915 08/13/18 0342  NA 137 143 141  K 3.7 4.2 4.4  CL 105 107 108  CO2 _0 GLUCOSE 86 87 92  BUN 8 5* <5*  CREATININE 0.57* 0.52* 0.54*  CALCIUM 8.9 9.2 9.5  MG  --  2.2  --    GFR: Estimated Creatinine Clearance: 93.2 mL/min (A) (by C-G formula based on SCr of 0.54 mg/dL (L)). Liver Function Tests: No results for input(s): AST, ALT, ALKPHOS, BILITOT, PROT, ALBUMIN in the last 168 hours. No results for input(s): LIPASE, AMYLASE in the last 168 hours. No results for input(s): AMMONIA in the last 168 hours. Coagulation Profile: No results for input(s): INR, PROTIME in the last 168 hours. Cardiac Enzymes: No results for input(s): CKTOTAL, CKMB, CKMBINDEX, TROPONINI in the last 168 hours. BNP (last 3 results) No results for input(s): PROBNP in the last 8760 hours. HbA1C: No results for input(s): HGBA1C in the last 72 hours. CBG: No results for input(s): GLUCAP in the last 168 hours. Lipid Profile: No results for input(s): CHOL, HDL, LDLCALC, TRIG, CHOLHDL, LDLDIRECT in the last 72 hours. Thyroid Function Tests: No results for input(s): TSH, T4TOTAL, FREET4, T3FREE, THYROIDAB in the last 72 hours. Anemia Panel: Recent Labs    08/10/18 1815  FERRITIN 17*  TIBC 405  IRON 41*   Sepsis Labs: No results for input(s): PROCALCITON, LATICACIDVEN in the last 168 hours.  Recent Results (from the past 240 hour(s))  Surgical pcr screen     Status: Abnormal   Collection Time: 08/12/18  6:00 AM  Result Value  Ref Range Status   MRSA, PCR POSITIVE (A) NEGATIVE Final    Comment: RESULT CALLED TO, READ BACK BY AND VERIFIED WITH: RUSSELL,J. RN AT 8119 08/12/18 MULLINS,T    Staphylococcus aureus POSITIVE (A) NEGATIVE Final    Comment: RESULT CALLED TO, READ BACK BY AND VERIFIED WITH: RUSSELL,J. RN AT 1478 08/12/18 MULLINS,T (NOTE) The Xpert SA Assay (FDA approved for NASAL specimens in patients 10 years of age and older), is one component of a comprehensive surveillance program. It is not intended to diagnose infection nor to guide or monitor treatment. Performed at [facility], [place] [address], [place], [place] [address]          Radiology Studies: No results found.      Scheduled Meds: . baclofen  20 mg Oral TID  . divalproex  500 mg Oral BID  . docusate sodium  100 mg Oral BID  . fesoterodine  4 mg Oral Daily  . hydrocortisone cream  1 application Topical QHS  .  loratadine  10 mg Oral Daily  . pantoprazole  80 mg Oral Daily  . peg 3350 powder  0.5 kit Oral Once   And  . peg 3350 powder  0.5 kit Oral Once  . risperiDONE  2 mg Oral QHS  . tamsulosin  0.4 mg Oral QHS  . tiZANidine  2 mg Oral TID   Continuous Infusions:   LOS: 3 days    Time spent: 35 minutes    Irine Seal, MD [facility]  If 7PM-7AM, please contact night-coverage [email or web address] 08/13/2018, 1:21 PM

## 2018-08-13 NOTE — Progress Notes (Signed)
Initial Nutrition Assessment  INTERVENTION:   Following procedures:  -Provide Ensure Enlive po BID, each supplement provides 350 kcal and 20 grams of protein -Provide Multivitamin with minerals daily  NUTRITION DIAGNOSIS:   Increased nutrient needs related to wound healing as evidenced by estimated needs.  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 3/17: s/p COLONOSCOPY WITH PROPOFOL (N/A) ESOPHAGOGASTRODUODENOSCOPY (EGD)   Patient with history of Billroth II gastrojejunostomy. Currently with moderate gastritis of gastric remnant. Now on CLD, will be NPO 3/20 for colonoscopy. Pt with multiple wounds, recommend supplements once procedures are completed.   Per weight records, last weight was recorded on January 2019. Pt has lost 42 lb since that time.   Labs reviewed. Medications reviewed.  NUTRITION - FOCUSED PHYSICAL EXAM:  Deferred given quadriplegia.  Diet Order:   Diet Order            Diet NPO time specified  Diet effective midnight        Diet clear liquid Room service appropriate? Yes; Fluid consistency: Thin  Diet effective now              EDUCATION NEEDS:   Not appropriate for education at this time  Skin:  Skin Assessment: Skin Integrity Issues: Skin Integrity Issues:: Stage II, Other (Comment) Stage II: ischial tuberosity Other: wounds on left leg, right foot  Last BM:  3/19  Height:   Ht Readings from Last 1 Encounters:  08/12/18 6\' 1"  (1.854 m)    Weight:   Wt Readings from Last 1 Encounters:  08/12/18 65.5 kg     Ideal Body Weight:  75.2 kg  BMI:  Body mass index is 19.05 kg/m.  Estimated Nutritional Needs:   Kcal:  1600-1800  Protein:  70-80g  Fluid:  1.8L/day  Tilda Franco, MS, RD, LDN [facility] Inpatient Clinical Dietitian Pager: [phone] After Hours Pager: [phone]

## 2018-08-13 NOTE — Progress Notes (Signed)
Received call from [facility] for update- CSW informed administrator Tresa Endo of rescheduled procedure for tomorrow.   Ilean Skill, MSW, LCSW Clinical Social Work 08/13/2018 [phone]

## 2018-08-13 NOTE — Anesthesia Preprocedure Evaluation (Addendum)
Anesthesia Evaluation  Patient identified by MRN, date of birth, ID band Patient awake    Reviewed: Allergy & Precautions, NPO status , Patient's Chart, lab work & pertinent test results  History of Anesthesia Complications Negative for: history of anesthetic complications  Airway Mallampati: II  TM Distance: >3 FB Neck ROM: Full    Dental no notable dental hx. (+) Dental Advisory Given   Pulmonary neg pulmonary ROS,    Pulmonary exam normal breath sounds clear to auscultation       Cardiovascular negative cardio ROS Normal cardiovascular exam Rhythm:Regular Rate:Normal     Neuro/Psych Anxiety Depression Bipolar Disorder Cerebral palsy, MR, quadriplegia negative neurological ROS     GI/Hepatic Neg liver ROS, hiatal hernia, PUD, GERD  Medicated,  Endo/Other  negative endocrine ROS  Renal/GU negative Renal ROS     Musculoskeletal  (+) Arthritis ,   Abdominal   Peds  Hematology  (+) anemia ,   Anesthesia Other Findings Day of surgery medications reviewed with the patient.  Reproductive/Obstetrics                            Anesthesia Physical Anesthesia Plan  ASA: III  Anesthesia Plan: MAC   Post-op Pain Management:    Induction:   PONV Risk Score and Plan: Treatment may vary due to age or medical condition and Propofol infusion  Airway Management Planned: Natural Airway and Simple Face Mask  Additional Equipment:   Intra-op Plan:   Post-operative Plan:   Informed Consent: I have reviewed the patients History and Physical, chart, labs and discussed the procedure including the risks, benefits and alternatives for the proposed anesthesia with the patient or authorized representative who has indicated his/her understanding and acceptance.   Patient has DNR.  Continue DNR and Discussed DNR with patient.   Dental advisory given  Plan Discussed with: CRNA  Anesthesia Plan  Comments:        Anesthesia Quick Evaluation

## 2018-08-13 NOTE — H&P (View-Only) (Signed)
     [facility] Progress Note  CC:  Anemia, gastric wall thickening on CT 02/2018  Subjective: He complains of mild central lower abdominal pain. He tolerated Movi prep without N/V.   Objective:  Vital signs in last 24 hours: Temp:  [97.5 F (36.4 C)-98.5 F (36.9 C)] 98.5 F (36.9 C) (03/19 0518) Pulse Rate:  [68-73] 72 (03/19 0518) Resp:  [12-19] 17 (03/19 0518) BP: (115-138)/(64-86) 118/86 (03/19 0518) SpO2:  [97 %-100 %] 99 % (03/19 0518) Weight:  [65.5 kg] 65.5 kg (03/18 1136) Last BM Date: 08/13/18 General:  Alert and talkative in NAD  Heart:  Regular rate and rhythm; no murmurs Pulm: Diminished breathsound right lung fields, diminished LLL, no wheezing, rhonchi or crackles. Abdomen: Soft, nondistended, mild tenderness central lower abdomen/suprapubic area without rebound or guarding, right of midline vertical scar, LLQ scar intact.   Extremities:  Trace edema to upper and lower extremities, LLE > RLE, contractures to all 4 extremities Neurologic:  Alert and  oriented x2;   Psych:  Alert and cooperative. Normal mood and affect.  Intake/Output from previous day: 03/18 0701 - 03/19 0700 In: 2340 [P.O.:1540; I.V.:800] Out: 2250 [Urine:1950; Stool:300] Intake/Output this shift: No intake/output data recorded.  Lab Results: Recent Labs    08/11/18 0322 08/12/18 0915 08/13/18 0342  WBC 7.2 7.4 8.4  HGB 10.2* 11.6* 11.9*  HCT 34.1* 38.0* 39.7  PLT 328 376 373   BMET Recent Labs    08/11/18 0322 08/12/18 0915 08/13/18 0342  NA 137 143 141  K 3.7 4.2 4.4  CL 105 107 108  CO2 26 26 25  GLUCOSE 86 87 92  BUN 8 5* <5*  CREATININE 0.57* 0.52* 0.54*  CALCIUM 8.9 9.2 9.5    Assessment / Plan:  1. 57 y.o. male with quadriparesis secondary to CP with microcytic anemia and gastric wall thickening per CT. EGD 3/18 showed the lower 1/3 of esophagus was tortuous, med to lg HH, evidence of past NF and Billroth II gastrojejunostomy, inflammation to the  gastric remnant. Colonoscopy was not done due to poor prep. Patient was re-prepped 3/18 for colonoscopy 3/19.  Today's colonoscopy cancelled due to inadequate prep, fecal bag continues to drain brown liquid with sediment. -clear liquids today then NPO after midnight -re prep with Movi prep today -colonoscopy rescheduled 08/14/2018  2. Mild microcytic anemia. Hg 11.9 >> 11.6. HCT 39.7 >>39. MCV 79.6. -will prep today for colonoscopy tomorrow. Already consented in our office.   3. Chronically elevated alk phos 174.  Normal AST/ALT -evaluate as outpatient   4. Chronic DVT of femoral vein.   5. Cerebral palsy / Quadriplegia. .   6. Asymptomatic cholelithiasis    LOS: 3 days   Colleen M Kennedy-Smith  08/13/2018, 8:50 AM     Attending Physician Note   I have taken an interval history, reviewed the chart and examined the patient. I agree with the Advanced Practitioner's note, impression and recommendations.   Iron deficiency anemia requiring further evaluation with colonoscopy. EGD report reviewed.  Inadequate bowel prep over night. Repeat bowel prep today and reschedule colonoscopy with Dr. Gupta at 0730 tomorrow.   Malcolm Stark, MD FACG [phone]   

## 2018-08-14 ENCOUNTER — Encounter (HOSPITAL_COMMUNITY)
Admission: AD | Disposition: A | Discharge status: Home / Self Care | Payer: Self-pay | Source: Home / Self Care | Attending: Internal Medicine

## 2018-08-14 ENCOUNTER — Inpatient Hospital Stay (HOSPITAL_COMMUNITY): Payer: Medicare Other | Admitting: Certified Registered"

## 2018-08-14 ENCOUNTER — Encounter (HOSPITAL_COMMUNITY): Payer: Self-pay | Admitting: Certified Registered Nurse Anesthetist

## 2018-08-14 ENCOUNTER — Other Ambulatory Visit: Payer: Self-pay

## 2018-08-14 DIAGNOSIS — K5730 Diverticulosis of large intestine without perforation or abscess without bleeding: Secondary | ICD-10-CM

## 2018-08-14 DIAGNOSIS — K648 Other hemorrhoids: Secondary | ICD-10-CM

## 2018-08-14 HISTORY — PX: COLONOSCOPY WITH PROPOFOL: SHX5780

## 2018-08-14 LAB — BASIC METABOLIC PANEL
Anion gap: 9 (ref 5–15)
BUN: <5 mg/dL — ABNORMAL LOW (ref 6–20)
CHLORIDE: 109 mmol/L (ref 98–111)
CO2: 24 mmol/L (ref 22–32)
Calcium: 8.8 mg/dL — ABNORMAL LOW (ref 8.9–10.3)
Creatinine, Ser: 0.4 mg/dL — ABNORMAL LOW (ref 0.61–1.24)
GFR calc Af Amer: >60 mL/min (ref >60)
GFR calc non Af Amer: >60 mL/min (ref >60)
Glucose, Bld: 89 mg/dL (ref 70–99)
Potassium: 3.5 mmol/L (ref 3.5–5.1)
Sodium: 142 mmol/L (ref 135–145)

## 2018-08-14 LAB — MAGNESIUM: Magnesium: 2.2 mg/dL (ref 1.7–2.4)

## 2018-08-14 SURGERY — COLONOSCOPY WITH PROPOFOL
Anesthesia: Monitor Anesthesia Care

## 2018-08-14 MED ORDER — PROPOFOL 500 MG/50ML IV EMUL
INTRAVENOUS | Status: DC | PRN
  Administered 2018-08-14: 100 ug/kg/min via INTRAVENOUS

## 2018-08-14 MED ORDER — LACTATED RINGERS IV SOLN
INTRAVENOUS | Status: DC | PRN
  Administered 2018-08-14: 09:00:00 via INTRAVENOUS

## 2018-08-14 MED ORDER — PROPOFOL 500 MG/50ML IV EMUL
INTRAVENOUS | Status: DC | PRN
  Administered 2018-08-14: 25 mg via INTRAVENOUS

## 2018-08-14 MED ORDER — PHENYLEPHRINE HCL 10 MG/ML IJ SOLN
INTRAMUSCULAR | Status: DC | PRN
  Administered 2018-08-14: 120 ug via INTRAVENOUS
  Administered 2018-08-14 (×4): 80 ug via INTRAVENOUS

## 2018-08-14 SURGICAL SUPPLY — 22 items

## 2018-08-14 NOTE — Progress Notes (Signed)
Joetta Manners at [facility] [phone]) updated re: pt's status and plan for likely DC tomorrow. She advised please call above number when pt ready for DC and group home staff will pick him up. Pt's guardian/sister [name] [phone] should be updated when pt DCs as well (legal guardianship paperwork copies in pt's chart).  Ilean Skill, MSW, LCSW Clinical Social Work 08/14/2018 [phone]

## 2018-08-14 NOTE — Transfer of Care (Signed)
Immediate Anesthesia Transfer of Care Note  Patient: [name]  Procedure(s) Performed: COLONOSCOPY WITH PROPOFOL (N/A )  Patient Location: PACU  Anesthesia Type:MAC  Level of Consciousness: awake and alert   Airway & Oxygen Therapy: Patient Spontanous Breathing and Patient connected to face mask oxygen  Post-op Assessment: Report given to RN and Post -op Vital signs reviewed and stable  Post vital signs: Reviewed and stable  Last Vitals:  Vitals Value Taken Time  BP    Temp    Pulse    Resp    SpO2      Last Pain:  Vitals:   08/14/18 0805  TempSrc: Oral  PainSc: 0-No pain      Patients Stated Pain Goal: 2 (53/74/82 7078)  Complications: No apparent anesthesia complications

## 2018-08-14 NOTE — Progress Notes (Signed)
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  Due to patient's mental issue and inability for  bowel preparation by himself he needed to be  Admitted .  Plan is to discharge him back to group home after EGD /colonoscopy   08/12/2018. ° ° °Assessment & Plan: °  °Active Problems: °  Infantile cerebral palsy (HCC) °  Person living in residential institution °  Quadriplegia (HCC) °  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) °  Pressure injury of skin °  Gastric wall thickening °  Alkaline phosphatase elevation °  Cholelithiasis °  Colon cancer screening °  Microcytic anemia ° °1 gastric wall thickening per CT scan °Patient was admitted for upper endoscopy and colonoscopy per GI.  Patient in PACU underwent upper endoscopy which showed moderate large hiatal hernia with slipped Nissen fundoplication.  Patent Billroth II gastrojejunostomy.  Healthy mucosa with moderate gastritis of gastric remnant.  Colonoscopy with unsatisfactory prep x 2.  Patient placed on clear liquids per GI.  Patient underwent colonoscopy today 08/14/2018 which showed sigmoid diverticulosis and nonbleeding small internal hemorrhoids.  Continue PPI.  Patient placed on a full liquid diet and if tolerates will advance to a regular diet.  Per GI.  ° °2.  Microcytic anemia °Patient had colonoscopy with unsatisfactory prep x2.  Anemia panel with iron level of 41, ferritin of 17.  Hemoglobin currently at 11.6.  Patient underwent upper endoscopy which showed moderate large hiatal hernia with slipped Nissen fundoplication.  Patent Billroth II gastrojejunostomy.  Healthy mucosa with moderate gastritis of the  gastric remnant noted.  Patient subsequently underwent colonoscopy successfully today 08/14/2018 which showed mild sigmoid diverticulosis, nonbleeding internal hemorrhoids.  H&H stable.  Per GI.  ° °3.  Chronically elevated alk phosphatase °Outpatient follow-up.  Patient with chronic asymptomatic cholelithiasis. ° °4.  Chronic DVT of the femoral vein.   °Not on anticoagulation.  Outpatient follow-up. ° °5.  Cerebral palsy/quadriplegia °Patient lives in a group home.  Continue Risperdal and Depakote.  Supportive care.  ° °6.  Sacral/bilateral heel ulcers °Present on admission.  Heel floaters.  Supportive care. ° ° °DVT prophylaxis: SCDs °Code Status: DNR °Family Communication: Updated patient.  No family at bedside. °Disposition Plan: Back to group home tomorrow.  ° ° °Consultants:  °· Gastroenterology: Dr. Gupta 08/11/2018 ° °Procedures: °· Upper endoscopy 08/12/2018 per Dr. Gupta °· Attempted colonoscopy 08/12/2018 per Dr. Gupta °· Colonoscopy 08/14/2018 ° °Antimicrobials:  °· None ° ° °Subjective: °Patient laying in fetal position.  Just returned from colonoscopy.  No chest pain.  No shortness of breath.  Tolerating clears.  ° °Objective: °Vitals:  ° 08/14/18 0615 08/14/18 0805 08/14/18 0925 08/14/18 0940  °BP: 118/61 (!) 113/51 (!) 103/48 (!) 108/52  °Pulse: 69 84  76  °Resp: 16 19 18 13  °Temp: 98.2 °F (36.8 °C) 98.2 °F (36.8 °C)    °TempSrc: Oral Oral    °SpO2: 92% 98% 100% 100%  °Weight:  65.5 kg    °Height:  6' 1" (1.854 m)    ° ° °Intake/Output Summary (Last 24 hours) at 08/14/2018 1211 °Last data filed at 08/14/2018 1143 °Gross per 24 hour  °Intake   3720 ml  °Output 2300 ml  °Net 1420 ml  ° °Filed Weights  ° 08/10/18 1117 08/12/18 1136 08/14/18 0805  °Weight: 65.5 kg 65.5 kg 65.5 kg  ° ° °Examination: ° °General exam: NAD °Respiratory system: Lungs clear to auscultation bilaterally.  No wheezes, no crackles, no rhonchi.   °Cardiovascular system: RRR no murmurs rubs or gallops.  No JVD.  No lower extremity edema.    °Gastrointestinal system: Abdomen is nontender, nondistended, soft, positive bowel sounds.  No rebound.  No guarding.  °Central nervous system: Alert and oriented.  Quadriplegia.  No focal neurological deficits. °Extremities: Symmetric 5 x 5 power. °Skin: Sacral and heel ulcers noted present on admission.  °Psychiatry: Judgement and insight appear normal. Mood & affect appropriate.  ° ° ° °Data Reviewed: I have personally reviewed following labs and imaging studies ° °CBC: °Recent Labs  °Lab 08/11/18 °0322 08/12/18 °0915 08/13/18 °0342  °WBC 7.2 7.4 8.4  °NEUTROABS  --  4.3 4.4  °HGB 10.2* 11.6* 11.9*  °HCT 34.1* 38.0* 39.7  °MCV 79.9* 77.9* 79.6*  °PLT 328 376 373  ° °Basic Metabolic Panel: °Recent Labs  °Lab 08/11/18 °0322 08/12/18 °0915 08/13/18 °0342 08/14/18 °0312  °NA 137 143 141 142  °K 3.7 4.2 4.4 3.5  °CL 105 107 108 109  °CO2 26 26 25 24  °GLUCOSE 86 87 92 89  °BUN 8 5* <5* <5*  °CREATININE 0.57* 0.52* 0.54* 0.40*  °CALCIUM 8.9 9.2 9.5 8.8*  °MG  --  2.2  --  2.2  ° °GFR: °Estimated Creatinine Clearance: 93.2 mL/min (A) (by C-G formula based on SCr of 0.4 mg/dL (L)). °Liver Function Tests: °No results for input(s): AST, ALT, ALKPHOS, BILITOT, PROT, ALBUMIN in the last 168 hours. °No results for input(s): LIPASE, AMYLASE in the last 168 hours. °No results for input(s): AMMONIA in the last 168 hours. °Coagulation Profile: °No results for input(s): INR, PROTIME in the last 168 hours. °Cardiac Enzymes: °No results for input(s): CKTOTAL, CKMB, CKMBINDEX, TROPONINI in the last 168 hours. °BNP (last 3 results) °No results for input(s): PROBNP in the last 8760 hours. °HbA1C: °No results for input(s): HGBA1C in the last 72 hours. °CBG: °No results for input(s): GLUCAP in the last 168 hours. °Lipid Profile: °No results for input(s): CHOL, HDL, LDLCALC, TRIG, CHOLHDL, LDLDIRECT in the last 72 hours. °Thyroid Function Tests: °No results for input(s): TSH, T4TOTAL, FREET4, T3FREE, THYROIDAB in the last 72  hours. °Anemia Panel: °No results for input(s): VITAMINB12, FOLATE, FERRITIN, TIBC, IRON, RETICCTPCT in the last 72 hours. °Sepsis Labs: °No results for input(s): PROCALCITON, LATICACIDVEN in the last 168 hours. ° °Recent Results (from the past 240 hour(s))  °Surgical pcr screen     Status: Abnormal  ° Collection Time: 08/12/18  6:00 AM  °Result Value Ref Range Status  ° MRSA, PCR POSITIVE (A) NEGATIVE Final  °  Comment: RESULT CALLED TO, READ BACK BY AND VERIFIED WITH: °RUSSELL,J. RN AT 0721 08/12/18 MULLINS,T °  ° Staphylococcus aureus POSITIVE (A) NEGATIVE Final  °  Comment: RESULT CALLED TO, READ BACK BY AND VERIFIED WITH: °RUSSELL,J. RN AT 0721 08/12/18 MULLINS,T °(NOTE) °The Xpert SA Assay (FDA approved for NASAL specimens in patients 22 °years of age and older), is one component of a comprehensive °surveillance program. It is not intended to diagnose infection nor to °guide or monitor treatment. °Performed at [facility], [address] °[place], [place] [address] °  °  ° ° ° ° ° °Radiology Studies: °  Studies: No results found.      Scheduled Meds:  baclofen  20 mg Oral TID   divalproex  500 mg Oral BID   docusate sodium  100 mg Oral BID   fesoterodine  4 mg Oral Daily   hydrocortisone cream  1 application Topical QHS   loratadine  10 mg Oral Daily   pantoprazole  80 mg Oral Daily   risperiDONE  2 mg Oral QHS   tamsulosin  0.4 mg Oral QHS   tiZANidine  2 mg Oral TID   Continuous Infusions:   LOS: 4 days    Time spent: 35 minutes    Irine Seal, MD [facility]  If 7PM-7AM, please contact night-coverage [email or web address] 08/14/2018, 12:11 PM

## 2018-08-14 NOTE — Anesthesia Postprocedure Evaluation (Signed)
Anesthesia Post Note  Patient: [name]  Procedure(s) Performed: COLONOSCOPY WITH PROPOFOL (N/A )     Patient location during evaluation: PACU Anesthesia Type: MAC Level of consciousness: awake and alert Pain management: pain level controlled Vital Signs Assessment: post-procedure vital signs reviewed and stable Respiratory status: spontaneous breathing, nonlabored ventilation, respiratory function stable and patient connected to nasal cannula oxygen Cardiovascular status: blood pressure returned to baseline and stable Postop Assessment: no apparent nausea or vomiting Anesthetic complications: no    Last Vitals:  Vitals:   08/14/18 0925 08/14/18 0940  BP: (!) 103/48 (!) 108/52  Pulse:  76  Resp: 18 13  Temp:    SpO2: 100% 100%    Last Pain:  Vitals:   08/14/18 0940  TempSrc:   PainSc: 0-No pain                 Brennan Bailey

## 2018-08-14 NOTE — Interval H&P Note (Signed)
History and Physical Interval Note:  08/14/2018 8:39 AM  [name]  has presented today for surgery, with the diagnosis of microcytic anemia.  The various methods of treatment have been discussed with the patient and family. After consideration of risks, benefits and other options for treatment, the patient has consented to  Procedure(s): COLONOSCOPY WITH PROPOFOL (N/A) as a surgical intervention.  The patient's history has been reviewed, patient examined, no change in status, stable for surgery.  I have reviewed the patient's chart and labs.  Questions were answered to the patient's satisfaction.     Lynann Bologna

## 2018-08-14 NOTE — Progress Notes (Signed)
Patient can de discharged home from [facility] today. We will arrange for follow up appt in 12-16 weeks. PCP will need to monitor hgb. We will sign off.  Hyacinth Meeker, PA-C [facility]

## 2018-08-14 NOTE — Op Note (Signed)
[facility] Patient Name: [name] Procedure Date: 08/14/2018 MRN: [id number] Attending MD: Lynann Bologna , MD Date of Birth: 04/08/1961 CSN: [id number] Age: 58 Admit Type: Inpatient Procedure:                Colonoscopy Indications:              Iron deficiency anemia, colorectal cancer screening. Providers:                Lynann Bologna, MD, Dwain Sarna, RN, Verita Schneiders,                            Technician, Mirian Mo, CRNA Referring MD:              Medicines:                Monitored Anesthesia Care Complications:            No immediate complications. Estimated Blood Loss:     Estimated blood loss: none. Procedure:                Pre-Anesthesia Assessment:                           - Prior to the procedure, a History and Physical                            was performed, and patient medications and                            allergies were reviewed. The patient's tolerance of                            previous anesthesia was also reviewed. The risks                            and benefits of the procedure and the sedation                            options and risks were discussed with the patient.                            All questions were answered, and informed consent                            was obtained. Prior Anticoagulants: The patient has                            taken no previous anticoagulant or antiplatelet                            agents. ASA Grade Assessment: III - A patient with                            severe systemic disease. After reviewing the risks  and benefits, the patient was deemed in                            satisfactory condition to undergo the procedure.                           After obtaining informed consent, the colonoscope                            was passed under direct vision. Throughout the                            procedure, the patient's blood pressure, pulse, and                     oxygen saturations were monitored continuously. The                            [id number]-[id number] ([id number] Olympus pediatric colonscope                            was introduced through the anus and advanced to the                            the cecum, identified by appendiceal orifice and                            ileocecal valve. The colonoscopy was technically                            difficult and complex due to fair bowel prep                            (despite 3-day preparation) and a tortuous colon.                            Successful completion of the procedure was aided by                            applying abdominal pressure. Terminal ileum could                            not be intubated due to the anatomy. The patient                            tolerated the procedure well. The quality of the                            bowel preparation was adequate to identify polyps 6                            mm and larger in size. Scope In: 8:57:20 AM Scope Out: 9:17:02 AM Total Procedure Duration: 0 hours 19 minutes 42 seconds  Findings:  A few small-mouthed diverticula were found in the sigmoid colon.      Non-bleeding internal hemorrhoids were found. The hemorrhoids were small.      The exam was otherwise without abnormality. Impression:               -Mild sigmoid diverticulosis.                           -The examination was otherwise grossly normal.                           - No specimens collected. Moderate Sedation:      Not Applicable - Patient had care per Anesthesia. Recommendation:           - Patient has a contact number available for                            emergencies. The signs and symptoms of potential                            delayed complications were discussed with the                            patient. Return to normal activities tomorrow.                            Written discharge instructions were provided to the                             patient.                           - Resume previous diet.                           - Continue present medications.                           - Miralax 1 capful (17 grams) in 8 ounces of water                            PO BID or previous bowel regimen.                           - Return to GI clinic in 12-16 weeks with Dr Christain Sacramento. Procedure Code(s):        --- Professional ---                           [phone], Colonoscopy, flexible; diagnostic, including                            collection of specimen(s) by brushing or washing,                            when performed (separate procedure) Diagnosis Code(s):        --- Professional ---  K64.8, Other hemorrhoids                           D50.9, Iron deficiency anemia, unspecified                           K57.30, Diverticulosis of large intestine without                            perforation or abscess without bleeding CPT copyright 2018 American Medical Association. All rights reserved. The codes documented in this report are preliminary and upon coder review may  be revised to meet current compliance requirements. Lynann Bologna, MD 08/14/2018 9:25:19 AM This report has been signed electronically. Number of Addenda: 0

## 2018-08-15 LAB — BASIC METABOLIC PANEL
Anion gap: 9 (ref 5–15)
BUN: 8 mg/dL (ref 6–20)
CO2: 24 mmol/L (ref 22–32)
Calcium: 9 mg/dL (ref 8.9–10.3)
Chloride: 105 mmol/L (ref 98–111)
Creatinine, Ser: 0.6 mg/dL — ABNORMAL LOW (ref 0.61–1.24)
GFR calc non Af Amer: >60 mL/min (ref >60)
Glucose, Bld: 206 mg/dL — ABNORMAL HIGH (ref 70–99)
Potassium: 4 mmol/L (ref 3.5–5.1)
SODIUM: 138 mmol/L (ref 135–145)

## 2018-08-15 LAB — HEMOGLOBIN AND HEMATOCRIT, BLOOD
HEMATOCRIT: 38 % — AB (ref 39.0–52.0)
Hemoglobin: 11.8 g/dL — ABNORMAL LOW (ref 13.0–17.0)

## 2018-08-15 MED ORDER — BISACODYL 5 MG PO TBEC: 10.0000 mg | DELAYED_RELEASE_TABLET | Freq: Every day | ORAL | 0 refills | Status: DC | PRN

## 2018-08-15 MED ORDER — POLYETHYLENE GLYCOL 3350 17 G PO PACK: 17.0000 g | PACK | Freq: Two times a day (BID) | ORAL | 0 refills | Status: DC

## 2018-08-15 MED ORDER — OMEPRAZOLE 40 MG PO CPDR: 40.0000 mg | DELAYED_RELEASE_CAPSULE | Freq: Two times a day (BID) | ORAL | 0 refills | Status: DC

## 2018-08-15 NOTE — Discharge Summary (Signed)
Physician Discharge Summary  [name] CMK:[id number] DOB: 1961/01/09 DOA: 08/10/2018  PCP: [name] Hefty, DO  Admit date: 08/10/2018 Discharge date: 08/15/2018  Time spent: 50 minutes  Recommendations for Outpatient Follow-up:  1. Follow-up with [name] P, DO in 2 weeks. 2. Follow-up with Dr. Modena Nunnery, gastroenterology in 2 to 3 months.   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 Charna [name] is a 58 y.o. male with medical history significant for spastic quadriparesis, cerebral palsy and mental retardation sent over from his group home for planned endoscopy and colonoscopy after patient noted to have some abnormal thickening seen on abdominal CT.  Due to patient's bed status and mentation issues, it was felt that he would not be able to give bowel prep to himself and therefore needed to come in to have this done prior to scope.   Review of Systems: Patient seen after arrival to floor. Pt with no complaints.  Does state that he has problems with chronic constipation  Pt denies any headache, vision changes, dysphagia, abdominal pain, nausea or vomiting, shortness of breath, hematuria or dysuria.  Review of systems are otherwise negative   Hospital Course:  1 gastric wall thickening per CT scan Patient was admitted for upper endoscopy and colonoscopy per GI.  Patient underwent upper endoscopy which showed moderate large hiatal hernia with slipped Nissen  fundoplication.  Patent Billroth II gastrojejunostomy.  Healthy mucosa with moderate gastritis of gastric remnant.  Colonoscopy with unsatisfactory prep x 2.  Patient placed on clear liquids per GI.  Patient underwent colonoscopy 08/14/2018 which showed sigmoid diverticulosis and nonbleeding small internal hemorrhoids.  Patient was maintained on a PPI twice daily.  Patient placed on a full liquid diet which he tolerated and subsequently advanced to a regular diet which he tolerated.  Patient was followed by GI who recommended outpatient follow-up.  Patient remained in stable condition.  2.  Microcytic anemia Patient had colonoscopy with unsatisfactory prep x2.  Anemia panel with iron level of 41, ferritin of 17.  Hemoglobin remained stable at 11.8. Patient underwent upper endoscopy which showed moderate large hiatal hernia with slipped Nissen fundoplication.  Patent Billroth II gastrojejunostomy.  Healthy mucosa with moderate gastritis of the gastric remnant noted.  Patient subsequently underwent colonoscopy successfully 08/14/2018 which showed mild sigmoid diverticulosis, nonbleeding internal hemorrhoids.  Patient maintained on a PPI twice daily.  Outpatient follow-up with gastroenterology.   3.  Chronically elevated alk phosphatase Outpatient follow-up.  Patient with chronic asymptomatic cholelithiasis.  4.  Chronic DVT of the femoral vein.   Not on anticoagulation.  Outpatient follow-up.  5.  Cerebral palsy/quadriplegia Patient lives in a group home.  Continued on home regimen of Risperdal and Depakote.   6.  Sacral/bilateral heel ulcers Present on admission.  Heel floaters.  Supportive care.  Procedures:  Upper endoscopy 08/12/2018 per Dr. [name]  Attempted colonoscopy 08/12/2018 per Dr. [name]  Colonoscopy 08/14/2018  Consultations:  Gastroenterology: Dr. [name] 08/11/2018  Discharge Exam: Vitals:   08/14/18 2115 08/15/18 0555  BP: (!) 119/58 (!) 114/54  Pulse: 98 86  Resp: 18 18   Temp: 98.2 F (36.8 C) 97.7 F (36.5 C)  SpO2: 96% 96%    General: NAD Cardiovascular: RRR Respiratory: CTAB  Discharge Instructions   Discharge Instructions    Diet general   Complete by:  As directed    Discharge instructions   Complete by:  As directed    No NSAIDS.   Increase activity slowly   Complete by:  As directed      Allergies as of 08/15/2018      Reactions   Adhesive [tape] Rash      Medication List    TAKE these medications   acetaminophen 500 MG tablet Commonly known as:  TYLENOL Take 1,000 mg by mouth every 6 (six) hours as needed for moderate pain.   ascorbic acid 250 MG tablet Commonly known as:  VITAMIN C Take 1 tablet (250 mg total) by mouth daily.   baclofen 20 MG tablet Commonly known as:  LIORESAL Take 1 tablet (20 mg total) by mouth 3 (three) times daily.   bisacodyl 5 MG EC tablet Commonly known as:  DULCOLAX Take 2 tablets (10 mg total) by mouth daily as needed for moderate constipation.   brompheniramine-pseudoephedrine 1-15 MG/5ML Elix Commonly known as:  DIMETAPP Take 10 mLs by mouth 2 (two) times daily as needed (cold symptoms). Use as directed as needed for cold symptoms per standing orders   cetirizine 10 MG tablet Commonly known as:  ZYRTEC Take 1 tablet (10 mg total) by mouth daily. What changed:  when to take this   diclofenac sodium 1 % Gel Commonly known as:  VOLTAREN Apply 4 g topically 4 (four) times daily.   divalproex 125 MG capsule Commonly known as:  DEPAKOTE SPRINKLE Take 500 mg by mouth 2 (two) times daily.   docusate sodium 100 MG capsule Commonly known as:  Colace Take 1 capsule (100 mg total) by mouth 2 (two) times daily.   Ear Drops Earwax Aid 6.5 % OTIC solution Generic drug:  carbamide peroxide PLACE 5 DROPS INTO THE LEFT EAR TWICE DAILY What changed:  See the new instructions.   guaifenesin 100 MG/5ML syrup Commonly known as:  ROBITUSSIN Take 200 mg by mouth as needed for cough.    hydrocortisone cream 1 % APPLY TOPICALLY TO FACE AT BEDTIME What changed:  See the new instructions.   Jobst Anti-Em Knee High Med Misc by Does not apply route. Apply once in the morning and remove at bedtime daily   ketoconazole 2 % shampoo Commonly known as:  NIZORAL APPLY TOPICALLY TO AFFECTED AREA(S) TWICE WEEKLY AS DIRECTED What changed:  See the new instructions.   loperamide 2 MG capsule Commonly known as:  IMODIUM Take 2 mg by mouth as needed for diarrhea or loose stools.   LORazepam 2 MG tablet Commonly known as:  ATIVAN Take 1 mg by mouth every 4 (four) hours as needed for anxiety.   magnesium hydroxide 400 MG/5ML suspension Commonly known as:  MILK OF MAGNESIA Take 30 mLs by mouth daily as needed for moderate constipation. Take as directed as needed for constipation per standing order   Mylanta 200-200-20 MG/5ML suspension Generic drug:  alum & mag hydroxide-simeth Take 30 mLs by mouth every 6 (six) hours as needed for indigestion or heartburn. Use as directed as needed for nausea/indigestion per standing order   omeprazole 40 MG capsule Commonly known  as:  PRILOSEC Take 1 capsule (40 mg total) by mouth 2 (two) times daily. What changed:  when to take this   oyster calcium 500 MG Tabs tablet Take 1,500 mg of elemental calcium by mouth daily.   polyethylene glycol packet Commonly known as:  MiraLax Take 17 g by mouth 2 (two) times daily.   Prevalon Heel Protector Misc 1 Device by Does not apply route daily.   risperiDONE 2 MG tablet Commonly known as:  RISPERDAL Take 2 mg by mouth at bedtime.   tamsulosin 0.4 MG Caps capsule Commonly known as:  Flomax Take 1 capsule (0.4 mg total) by mouth daily.   tiZANidine 2 MG tablet Commonly known as:  ZANAFLEX Take 2 mg by mouth 3 (three) times daily.   tolterodine 2 MG 24 hr capsule Commonly known as:  Detrol LA Take 1 capsule (2 mg total) by mouth daily. What changed:  when to take this   tretinoin 0.025  % gel Commonly known as:  RETIN-A Apply 1 application topically at bedtime.   triamcinolone cream 0.1 % Commonly known as:  KENALOG Apply 1 application topically daily.   TRIPLE ANTIBIOTIC EX Apply 1 application topically as needed (cuts/scrapes/scratches).   Vitamin D3 125 MCG (5000 UT) Caps Take 5,000 Units by mouth daily.   Zinc Oxide 40 % Pste APPLY A THIN LAYER TOPICALLY TO BUTTOCKS AND SCROTUM TWICE DAILY *BEFIN AFTER COMPLETION OF CLOTRIMAZOLE OINTMENT* What changed:  See the new instructions.      Allergies  Allergen Reactions  . Adhesive [Tape] Rash   Follow-up Information    Mullis, Kiersten P, DO. Schedule an appointment as soon as possible for a visit in 2 week(s).   Specialty:  Family Medicine Why:  f/u in 2 weeks. Contact information: [address] [place] [address] [phone]        Thornton Park, MD. Schedule an appointment as soon as possible for a visit in 2 month(s).   Specialty:  Gastroenterology Why:  f/u in 2-3 months Contact information: [place] [address] [phone]            The results of significant diagnostics from this hospitalization (including imaging, microbiology, ancillary and laboratory) are listed below for reference.    Significant Diagnostic Studies: No results found.  Microbiology: Recent Results (from the past 240 hour(s))  Surgical pcr screen     Status: Abnormal   Collection Time: 08/12/18  6:00 AM  Result Value Ref Range Status   MRSA, PCR POSITIVE (A) NEGATIVE Final    Comment: RESULT CALLED TO, READ BACK BY AND VERIFIED WITH: RUSSELL,J. RN AT 0867 08/12/18 MULLINS,T    Staphylococcus aureus POSITIVE (A) NEGATIVE Final    Comment: RESULT CALLED TO, READ BACK BY AND VERIFIED WITH: RUSSELL,J. RN AT 6195 08/12/18 MULLINS,T (NOTE) The Xpert SA Assay (FDA approved for NASAL specimens in patients 30 years of age and older), is one component of a comprehensive surveillance  program. It is not intended to diagnose infection nor to guide or monitor treatment. Performed at [facility], [place] [address], [place], [place] [address]      Labs: Basic Metabolic Panel: Recent Labs  Lab 08/11/18 0322 08/12/18 0915 08/13/18 0342 08/14/18 0312 08/15/18 0900  NA 137 143 141 142 138  K 3.7 4.2 4.4 3.5 4.0  CL 105 107 108 109 105  CO2 _0 GLUCOSE 86 87 92 89 206*  BUN 8 5* <5* <  5* 8  CREATININE 0.57* 0.52* 0.54* 0.40* 0.60*  CALCIUM 8.9 9.2 9.5 8.8* 9.0  MG  --  2.2  --  2.2  --    Liver Function Tests: No results for input(s): AST, ALT, ALKPHOS, BILITOT, PROT, ALBUMIN in the last 168 hours. No results for input(s): LIPASE, AMYLASE in the last 168 hours. No results for input(s): AMMONIA in the last 168 hours. CBC: Recent Labs  Lab 08/11/18 0322 08/12/18 0915 08/13/18 0342 08/15/18 0900  WBC 7.2 7.4 8.4  --   NEUTROABS  --  4.3 4.4  --   HGB 10.2* 11.6* 11.9* 11.8*  HCT 34.1* 38.0* 39.7 38.0*  MCV 79.9* 77.9* 79.6*  --   PLT 328 376 373  --    Cardiac Enzymes: No results for input(s): CKTOTAL, CKMB, CKMBINDEX, TROPONINI in the last 168 hours. BNP: BNP (last 3 results) No results for input(s): BNP in the last 8760 hours.  ProBNP (last 3 results) No results for input(s): PROBNP in the last 8760 hours.  CBG: No results for input(s): GLUCAP in the last 168 hours.     Signed:  Irine Seal MD.  [facility] 08/15/2018, 12:48 PM

## 2018-08-26 ENCOUNTER — Encounter (HOSPITAL_BASED_OUTPATIENT_CLINIC_OR_DEPARTMENT_OTHER): Payer: Medicare Other | Attending: Internal Medicine

## 2018-08-26 ENCOUNTER — Other Ambulatory Visit (HOSPITAL_COMMUNITY): Payer: Self-pay | Admitting: Physician Assistant

## 2018-08-26 ENCOUNTER — Other Ambulatory Visit (HOSPITAL_COMMUNITY)
Admission: RE | Admit: 2018-08-26 | Discharge: 2018-08-26 | Disposition: A | Discharge status: Home / Self Care | Payer: Medicare Other | Attending: *Deleted | Admitting: *Deleted

## 2018-08-26 ENCOUNTER — Ambulatory Visit (HOSPITAL_COMMUNITY)
Admission: RE | Admit: 2018-08-26 | Discharge: 2018-08-26 | Disposition: A | Discharge status: Home / Self Care | Payer: Medicare Other | Source: Ambulatory Visit | Attending: Physician Assistant | Admitting: Physician Assistant

## 2018-08-26 ENCOUNTER — Encounter (HOSPITAL_BASED_OUTPATIENT_CLINIC_OR_DEPARTMENT_OTHER): Payer: Self-pay

## 2018-08-26 ENCOUNTER — Other Ambulatory Visit: Payer: Self-pay

## 2018-08-26 DIAGNOSIS — L97321 Non-pressure chronic ulcer of left ankle limited to breakdown of skin: Secondary | ICD-10-CM

## 2018-08-26 DIAGNOSIS — L97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 layer exposed: Secondary | ICD-10-CM | POA: Insufficient documentation

## 2018-08-26 DIAGNOSIS — L97322 Non-pressure chronic ulcer of left 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: Secondary | ICD-10-CM | POA: Insufficient documentation

## 2018-08-26 DIAGNOSIS — L89893 Pressure ulcer of other site, stage 3: Secondary | ICD-10-CM | POA: Diagnosis not present

## 2018-08-26 DIAGNOSIS — L97511 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 breakdown of skin: Secondary | ICD-10-CM

## 2018-08-26 DIAGNOSIS — I7389 Other specified peripheral vascular diseases: Secondary | ICD-10-CM | POA: Diagnosis not present

## 2018-08-26 DIAGNOSIS — Z89411 Acquired absence of right great toe: Secondary | ICD-10-CM | POA: Insufficient documentation

## 2018-08-26 DIAGNOSIS — Z86718 Personal history of other venous thrombosis and embolism: Secondary | ICD-10-CM | POA: Diagnosis not present

## 2018-08-26 DIAGNOSIS — L03116 Cellulitis of left lower limb: Secondary | ICD-10-CM | POA: Diagnosis not present

## 2018-08-26 DIAGNOSIS — L97812 Non-pressure chronic ulcer of other part of right lower leg with fat layer exposed: Secondary | ICD-10-CM | POA: Diagnosis not present

## 2018-08-28 LAB — AEROBIC CULTURE W GRAM STAIN (SUPERFICIAL SPECIMEN)

## 2018-09-10 DIAGNOSIS — L89893 Pressure ulcer of other site, stage 3: Secondary | ICD-10-CM | POA: Diagnosis not present

## 2018-09-24 DIAGNOSIS — L89893 Pressure ulcer of other site, stage 3: Secondary | ICD-10-CM | POA: Diagnosis not present

## 2018-09-29 ENCOUNTER — Ambulatory Visit: Payer: Medicare Other | Admitting: Podiatry

## 2018-09-30 ENCOUNTER — Other Ambulatory Visit: Payer: Self-pay | Admitting: Family Medicine

## 2018-10-01 ENCOUNTER — Encounter (HOSPITAL_BASED_OUTPATIENT_CLINIC_OR_DEPARTMENT_OTHER): Payer: Medicare Other | Attending: Internal Medicine

## 2018-10-01 DIAGNOSIS — Z86718 Personal history of other venous thrombosis and embolism: Secondary | ICD-10-CM | POA: Insufficient documentation

## 2018-10-01 DIAGNOSIS — L97322 Non-pressure chronic ulcer of left 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: Secondary | ICD-10-CM | POA: Insufficient documentation

## 2018-10-01 DIAGNOSIS — L89893 Pressure ulcer of other site, stage 3: Secondary | ICD-10-CM | POA: Diagnosis present

## 2018-10-01 DIAGNOSIS — I7389 Other specified peripheral vascular diseases: Secondary | ICD-10-CM | POA: Insufficient documentation

## 2018-10-01 DIAGNOSIS — L97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 layer exposed: Secondary | ICD-10-CM | POA: Insufficient documentation

## 2018-10-01 DIAGNOSIS — G8250 Quadriplegia, unspecified: Secondary | ICD-10-CM | POA: Diagnosis not present

## 2018-10-09 DIAGNOSIS — L89893 Pressure ulcer of other site, stage 3: Secondary | ICD-10-CM | POA: Diagnosis not present

## 2018-10-20 DIAGNOSIS — L89893 Pressure ulcer of other site, stage 3: Secondary | ICD-10-CM | POA: Diagnosis not present

## 2018-10-27 ENCOUNTER — Other Ambulatory Visit: Payer: Self-pay

## 2018-10-27 ENCOUNTER — Encounter (HOSPITAL_BASED_OUTPATIENT_CLINIC_OR_DEPARTMENT_OTHER): Payer: Medicare Other | Attending: Internal Medicine

## 2018-10-27 DIAGNOSIS — L97322 Non-pressure chronic ulcer of left 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: Secondary | ICD-10-CM | POA: Insufficient documentation

## 2018-10-27 DIAGNOSIS — I7389 Other specified peripheral vascular diseases: Secondary | ICD-10-CM | POA: Insufficient documentation

## 2018-10-27 DIAGNOSIS — L97821 Non-pressure chronic ulcer of other part of left lower leg limited to breakdown of skin: Secondary | ICD-10-CM | POA: Diagnosis not present

## 2018-10-27 DIAGNOSIS — B9562 Methicillin resistant Staphylococcus aureus infection as the cause of diseases classified elsewhere: Secondary | ICD-10-CM | POA: Diagnosis not present

## 2018-10-27 DIAGNOSIS — L89893 Pressure ulcer of other site, stage 3: Secondary | ICD-10-CM | POA: Insufficient documentation

## 2018-10-27 DIAGNOSIS — Z86718 Personal history of other venous thrombosis and embolism: Secondary | ICD-10-CM | POA: Diagnosis not present

## 2018-10-27 DIAGNOSIS — G8250 Quadriplegia, unspecified: Secondary | ICD-10-CM | POA: Insufficient documentation

## 2018-11-10 ENCOUNTER — Other Ambulatory Visit (HOSPITAL_COMMUNITY)
Admission: RE | Admit: 2018-11-10 | Discharge: 2018-11-10 | Disposition: A | Discharge status: Home / Self Care | Payer: Medicare Other | Source: Other Acute Inpatient Hospital | Attending: Internal Medicine | Admitting: Internal Medicine

## 2018-11-10 DIAGNOSIS — L97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 layer exposed: Secondary | ICD-10-CM | POA: Insufficient documentation

## 2018-11-10 DIAGNOSIS — L89893 Pressure ulcer of other site, stage 3: Secondary | ICD-10-CM | POA: Diagnosis not present

## 2018-11-12 LAB — AEROBIC CULTURE W GRAM STAIN (SUPERFICIAL SPECIMEN): Gram Stain: NONE SEEN

## 2018-11-12 LAB — AEROBIC CULTURE? (SUPERFICIAL SPECIMEN)

## 2018-11-23 DIAGNOSIS — L89893 Pressure ulcer of other site, stage 3: Secondary | ICD-10-CM | POA: Diagnosis not present

## 2018-12-01 ENCOUNTER — Encounter (HOSPITAL_BASED_OUTPATIENT_CLINIC_OR_DEPARTMENT_OTHER): Payer: Medicare Other | Attending: Internal Medicine

## 2018-12-01 DIAGNOSIS — L97822 Non-pressure chronic ulcer of other part of left lower leg with fat layer exposed: Secondary | ICD-10-CM | POA: Insufficient documentation

## 2018-12-01 DIAGNOSIS — L97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 layer exposed: Secondary | ICD-10-CM | POA: Insufficient documentation

## 2018-12-01 DIAGNOSIS — G629 Polyneuropathy, unspecified: Secondary | ICD-10-CM | POA: Insufficient documentation

## 2018-12-01 DIAGNOSIS — L97322 Non-pressure chronic ulcer of left 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: Secondary | ICD-10-CM | POA: Diagnosis not present

## 2018-12-01 DIAGNOSIS — Z86718 Personal history of other venous thrombosis and embolism: Secondary | ICD-10-CM | POA: Insufficient documentation

## 2018-12-08 ENCOUNTER — Other Ambulatory Visit: Payer: Self-pay

## 2018-12-08 ENCOUNTER — Ambulatory Visit (INDEPENDENT_AMBULATORY_CARE_PROVIDER_SITE_OTHER): Payer: Medicare Other | Admitting: Podiatry

## 2018-12-08 ENCOUNTER — Encounter: Payer: Self-pay | Admitting: Podiatry

## 2018-12-08 DIAGNOSIS — B351 Tinea unguium: Secondary | ICD-10-CM

## 2018-12-08 DIAGNOSIS — M79676 Pain in unspecified toe(s): Secondary | ICD-10-CM

## 2018-12-08 NOTE — Patient Instructions (Signed)

## 2018-12-13 NOTE — Progress Notes (Signed)
Subjective:  [name] presents to clinic today with cc of  painful, thick, discolored, elongated toenails 1-5 b/l that become tender and cannot cut because of thickness.   He is accompanied by his caretaker from this group home.  They voice no new pedal concerns on today's visit.  Mullis, Kiersten P, DO is his PCP.    Current Outpatient Medications:  .  acetaminophen (TYLENOL) 500 MG tablet, Take 1,000 mg by mouth every 6 (six) hours as needed for moderate pain., Disp: , Rfl:  .  alum & mag hydroxide-simeth (MYLANTA) 086-578-46 MG/5ML suspension, Take 30 mLs by mouth every 6 (six) hours as needed for indigestion or heartburn. Use as directed as needed for nausea/indigestion per standing order, Disp: , Rfl:  .  baclofen (LIORESAL) 20 MG tablet, Take 1 tablet (20 mg total) by mouth 3 (three) times daily., Disp: 90 each, Rfl: 1 .  bisacodyl (DULCOLAX) 5 MG EC tablet, Take 2 tablets (10 mg total) by mouth daily as needed for moderate constipation., Disp: 30 tablet, Rfl: 0 .  brompheniramine-pseudoephedrine (DIMETAPP) 1-15 MG/5ML ELIX, Take 10 mLs by mouth 2 (two) times daily as needed (cold symptoms). Use as directed as needed for cold symptoms per standing orders, Disp: , Rfl:  .  cetirizine (ZYRTEC) 10 MG tablet, Take 1 tablet (10 mg total) by mouth daily. (Patient taking differently: Take 10 mg by mouth at bedtime. ), Disp: 30 tablet, Rfl: 3 .  Cholecalciferol (VITAMIN D3) 5000 units CAPS, Take 5,000 Units by mouth daily., Disp: , Rfl:  .  diclofenac sodium (VOLTAREN) 1 % GEL, Apply 4 g topically 4 (four) times daily., Disp: , Rfl:  .  divalproex (DEPAKOTE SPRINKLE) 125 MG capsule, Take 500 mg by mouth 2 (two) times daily. , Disp: , Rfl:  .  divalproex (DEPAKOTE SPRINKLE) 125 MG capsule, , Disp: , Rfl:  .  docusate sodium (COLACE) 100 MG capsule, Take 1 capsule (100 mg total) by mouth 2 (two) times daily., Disp: 60 capsule, Rfl: 2 .  EAR DROPS EARWAX AID 6.5 % OTIC solution, PLACE 5 DROPS  INTO THE LEFT EAR TWICE DAILY (Patient taking differently: Place 5 drops into the left ear 2 (two) times daily. ), Disp: 15 mL, Rfl: 4 .  Elastic Bandages & Supports (JOBST ANTI-EM KNEE HIGH MED) MISC, by Does not apply route. Apply once in the morning and remove at bedtime daily, Disp: , Rfl:  .  Foot Care Products (PREVALON HEEL PROTECTOR) MISC, 1 Device by Does not apply route daily., Disp: 2 each, Rfl: 0 .  guaifenesin (ROBITUSSIN) 100 MG/5ML syrup, Take 200 mg by mouth as needed for cough., Disp: , Rfl:  .  hydrocortisone cream 1 %, APPLY TOPICALLY TO FACE AT BEDTIME (Patient taking differently: Apply 1 application topically at bedtime. ), Disp: 28.4 g, Rfl: 4 .  ketoconazole (NIZORAL) 2 % shampoo, APPLY TOPICALLY TO AFFECTED AREA(S) TWICE WEEKLY AS DIRECTED (Patient taking differently: Apply 1 application topically 2 (two) times a week. ), Disp: 120 mL, Rfl: 4 .  loperamide (IMODIUM) 2 MG capsule, Take 2 mg by mouth as needed for diarrhea or loose stools., Disp: , Rfl:  .  LORazepam (ATIVAN) 2 MG tablet, Take 1 mg by mouth every 4 (four) hours as needed for anxiety. , Disp: , Rfl:  .  magnesium hydroxide (MILK OF MAGNESIA) 400 MG/5ML suspension, Take 30 mLs by mouth daily as needed for moderate constipation. Take as directed as needed for constipation per standing order, Disp: ,  Rfl:  .  Neomycin-Bacitracin-Polymyxin (TRIPLE ANTIBIOTIC EX), Apply 1 application topically as needed (cuts/scrapes/scratches)., Disp: , Rfl:  .  omeprazole (PRILOSEC) 40 MG capsule, Take 1 capsule (40 mg total) by mouth 2 (two) times daily., Disp: 60 capsule, Rfl: 0 .  Oyster Shell (OYSTER CALCIUM) 500 MG TABS tablet, Take 1,500 mg of elemental calcium by mouth daily., Disp: , Rfl:  .  polyethylene glycol (MIRALAX) packet, Take 17 g by mouth 2 (two) times daily., Disp: 100 each, Rfl: 0 .  risperiDONE (RISPERDAL) 2 MG tablet, Take 2 mg by mouth at bedtime., Disp: , Rfl:  .  tamsulosin (FLOMAX) 0.4 MG CAPS capsule, TAKE 1  CAPSULE BY MOUTH EVERY DAY *DO NOT CRUSH*, Disp: 30 capsule, Rfl: 2 .  tiZANidine (ZANAFLEX) 2 MG tablet, Take 2 mg by mouth 3 (three) times daily. , Disp: , Rfl:  .  tolterodine (DETROL LA) 2 MG 24 hr capsule, Take 1 capsule (2 mg total) by mouth daily. (Patient taking differently: Take 2 mg by mouth daily at 6 PM. ), Disp: 90 capsule, Rfl: 3 .  tretinoin (RETIN-A) 0.025 % gel, Apply 1 application topically at bedtime., Disp: , Rfl:  .  triamcinolone cream (KENALOG) 0.1 %, Apply 1 application topically daily. , Disp: , Rfl:  .  vitamin C (VITAMIN C) 250 MG tablet, Take 1 tablet (250 mg total) by mouth daily., Disp: 30 tablet, Rfl: 0 .  Zinc Oxide 40 % PSTE, APPLY A THIN LAYER TOPICALLY TO BUTTOCKS AND SCROTUM TWICE DAILY *BEFIN AFTER COMPLETION OF CLOTRIMAZOLE OINTMENT* (Patient taking differently: Apply 1 application topically 2 (two) times daily. To scrotum and buttocks *Begin after completion of clotrimazole ointment*), Disp: 113 g, Rfl: 4   Allergies  Allergen Reactions  . Adhesive [Tape] Rash     Objective: Physical Examination:  Vascular Examination: Capillary refill time immediate x 10 digits.  Palpable DP/PT pulses b/l.  Digital hair sparse b/l.  No edema noted b/l.  Skin temperature gradient WNL b/l.  Dermatological Examination: Pedal skin warm and dry b/l.  Dressings noted b/l LE. Clean, dry and intact b/l.  Noted healing abrasion left 4th digit. No erythema, no edema, no drainage, no flocculence.  No interdigital macerations noted b/l.  Elongated, thick, discolored brittle toenails with subungual debris and pain on dorsal palpation of nailbeds 1-5 b/l.  Musculoskeletal Examination: Quadriplegia b/l LE.  Uses motorized chair.  Neurological Examination: Sensation intact 5/5 b/l with 10 gram monofilament.  Assessment: Mycotic nail infection with pain 1-5 b/l  Plan: 1. Toenails 1-5 b/l were debrided in length and girth without iatrogenic  laceration. 2. Continue Outpatient Wound Care b/l lower extremities. 3. Continue soft, supportive shoe gear daily. 4. Report any pedal injuries to medical professional. 5.   Follow up 4 months. 6.  Patient/POA to call should there be a question/concern in there interim.

## 2018-12-15 DIAGNOSIS — L97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 layer exposed: Secondary | ICD-10-CM | POA: Diagnosis not present

## 2018-12-17 ENCOUNTER — Encounter: Payer: Self-pay | Admitting: Gastroenterology

## 2018-12-29 ENCOUNTER — Encounter (HOSPITAL_BASED_OUTPATIENT_CLINIC_OR_DEPARTMENT_OTHER): Payer: Medicare Other | Attending: Internal Medicine

## 2018-12-29 DIAGNOSIS — Z86718 Personal history of other venous thrombosis and embolism: Secondary | ICD-10-CM | POA: Insufficient documentation

## 2018-12-29 DIAGNOSIS — L89890 Pressure ulcer of other site, unstageable: Secondary | ICD-10-CM | POA: Diagnosis present

## 2018-12-29 DIAGNOSIS — L97822 Non-pressure chronic ulcer of other part of left lower leg with fat layer exposed: Secondary | ICD-10-CM | POA: Insufficient documentation

## 2018-12-29 DIAGNOSIS — G808 Other cerebral palsy: Secondary | ICD-10-CM | POA: Diagnosis not present

## 2019-01-12 DIAGNOSIS — L89890 Pressure ulcer of other site, unstageable: Secondary | ICD-10-CM | POA: Diagnosis not present

## 2019-02-25 ENCOUNTER — Encounter (HOSPITAL_BASED_OUTPATIENT_CLINIC_OR_DEPARTMENT_OTHER): Payer: Medicare Other | Admitting: Internal Medicine

## 2019-03-11 ENCOUNTER — Encounter (HOSPITAL_BASED_OUTPATIENT_CLINIC_OR_DEPARTMENT_OTHER): Payer: Medicare Other | Admitting: Internal Medicine

## 2019-03-31 ENCOUNTER — Other Ambulatory Visit: Payer: Self-pay

## 2019-03-31 ENCOUNTER — Encounter (HOSPITAL_BASED_OUTPATIENT_CLINIC_OR_DEPARTMENT_OTHER): Payer: Medicare Other | Attending: Physician Assistant | Admitting: Physician Assistant

## 2019-03-31 DIAGNOSIS — G800 Spastic quadriplegic cerebral palsy: Secondary | ICD-10-CM | POA: Diagnosis not present

## 2019-03-31 DIAGNOSIS — L89152 Pressure ulcer of sacral region, stage 2: Secondary | ICD-10-CM | POA: Insufficient documentation

## 2019-03-31 DIAGNOSIS — Z86718 Personal history of other venous thrombosis and embolism: Secondary | ICD-10-CM | POA: Insufficient documentation

## 2019-03-31 NOTE — Progress Notes (Signed)
[name] ([id number]) Visit Report for 03/31/2019 Chief Complaint Document Details Patient Name: Date of Service: [name], [name] 03/31/2019 9:45 AM Medical Record [id number] Patient Account Number: [id number] Date of Birth/Sex: Treating RN: [birth year] (58 y.o. [name] Primary Care Provider: Orpah [name] Other Clinician: Referring Provider: Treating Provider/Extender:[name], [name], [name] in Treatment: 56 Information Obtained from: Patient Chief Complaint He is here for follow evaluation for ulcers to his left foot/malleolus 07/14/17; patient is here for multiple wounds on his bilateral feet and left ankle. Electronic Signature([name]) Signed: 03/31/2019 6:48:11 PM By: [name] Kelp PA-C Entered By: [name] on 03/31/2019 10:18:35 -------------------------------------------------------------------------------- HPI Details Patient Name: Date of Service: [name], [name] 03/31/2019 9:45 AM Medical Record [id number] Patient Account Number: [id number] Date of Birth/Sex: Treating RN: Apr 28, 1961 (58 y.o. [name] Primary Care Provider: Orpah [name] Other Clinician: Referring Provider: Treating Provider/Extender:[name], [name], [name] in Treatment: 66 History of Present Illness HPI Description: 11/23/15; this is a patient I remember from a previous stay in this clinic in 2012. The patient says this was for a wound in this same area as currently although I have no information to verify that. Most of the information is provided by his caregiver from the group home where he resides. Apparently he has had a wound in this area for 8 months. He also has erythema around this area and has had multiple rounds of antibiotics apparently with some improvement but the erythema is persists. He apparently had a fracture in the ankle 2 months ago and was seen by Dr. Lajoyce [name] . He apparently had a splint applied and more  recently a heel offloading boot. He had home health coming out to a week ago not clear what they are dressing this with but they apparently discharged him perhaps because of one of Dr. Audrie [name] socks The patient has cerebral palsy and has a functional quadriparetic. He has a Nurse, adult for transferring at home he is nonambulatory and does not wear footwear. He is not a diabetic. Looking through [place] [facility] he appears to have fairly active primary care. The area on the ankle is variably labeled as a pressure area and/or chronic venous insufficiency. He had a recent venous ultrasound on 5/25 that did not show a DVT in the left leg however this was not a reflux study. There was no superficial DVT either. Arterial studies on 10/15/14 showed a left ABI of 1.16 Center right of 1.12 waveforms were triphasic he does not have significant arterial disease. He has had recent x-rays of the left foot and ankle. The left foot did not show any abnormality. Left ankle x-ray showed a spiral fracture of the left tibial diaphysis without evidence of osteomyelitis. 11/30/15 culture I did last week showed pseudomonas I changed him to oral ciprofloxacin. Erythema is a lot better. 12/07/15 area around the wound shows considerable improvement of the periwound erythema. 12/14/15; the patient has a improvement of the periwound erythema which in retrospect I think with cellulitis successfully treated. We have been using Iodoflex started last week 12/25/15 wound on the left medial malleolus and dorsal left foot. Theraskin #1 applied READMISSION 02/27/16; the patient was admitted to hospital from 8/14 through 01/18/16 initially felt to have cellulitis of his left lower leg however he was noted to have significant persistent pain and swelling. A duplex ultrasound was ordered that was negative. [name] an x-ray was done that showed a spiral fracture of the left leg. With posterior displacement  and angulation at the mid left tibia.  Orthopedics saw the patient and splinted the leg. He was then sent to [facility] skilled facility and only return to his group home last week. He has wounds on the left medial malleolus, left dorsal foot and a worrisome area on the plantar left heel which is not really open but may represent a hematoma or a DTI 03/06/16; areas of left anterior and medial ankle both look improved. There are 3 small wounds here. He has a area over the dorsal left first metatarsal head which is not open. In meantime his left heel has opened 03/14/16 the medial left ankle wounds looks stable to improved. The area on the posterior left heel has opened up and had a very adherent eschar and slept over this 03/21/16 patient has 3 wounds on his left medial ankle is stable area on his left dorsal first metatarsal head appears to be improved. Continued problems with adherent eschar over the left heel 03/28/16; patient has 3 wounds on his left medial ankle and lower leg all of these appear to be stable. The problem continues to be the left heel and the adherent eschar. This may have been a pressure ulcer from her brace at one point. He has a English as a second language teacher we have been using [employer] and all wound areas. Dressings are being changed by home health 04/04/16; the patient'[name] areas on the medial aspect of the left ankle looks superficial and appear to be gradually closing. The area on the plantar aspects/Achilles aspect of his left heel as surface slough and nonviable subcutaneous tissue requiring debridement. 04/11/16; the areas on the patient'[name] medial left ankle appear to be superficial and appear to be closing. The area on the plantar heel on the left has surface slough and nonviable subcutaneous tissue requiring debridement. I don't think this is too much different from last week there is also undermining 04/25/16; patient is followed in his group home by [facility] home health. The area on the plantar heel is smaller but with  still with some depth. We're using Iodoflex to this. The areas on the left ankle looks healthy but dry and somewhat larger. Cause of this is not really clear using Prisma 05/02/16; areas x3 all look improved expecially the areas on the medial ankle. Heel wound is smaller but still deep 05/09/16; all areas 3 look improved on the medial ankle. Heel wound is also contracting. 12/0.9/17 the areas on his left medial ankle are all closed. I think these are largely chronic venous insufficiency however there is no edema. The area on his left heel was a pressure area. READMISSION 08/02/16; this man is a patient that we have had in this clinic on several times before. I believe he has cerebral palsy is nonambulatory. He has chronic venous insufficiency with venous inflammation. He was last year in the fall of 2017 with areas on his left medial ankle left heel and left dorsal foot. He arrives today with once again a vague history. As mentioned he is nonambulatory he spends most of his day in a pressure offloading boot nevertheless they noted wounds in this area on the medial left ankle 2 weeks ago. There is any other history here I am uncertain 08/15/16; the patient'[name] wounds on his medial ankle actually look better however the area on the left lateral/dorsal foot is covered in black necrotic eschar. Previous formal arterial studies did not show significant arterial disease with normal ABIs and triphasic waveforms. He is immobilized and  at risk for pressure although he seems aware protective boots at all times as far as I am aware. 08/22/16; in general his wounds look better. All the wounds on his medial left ankle look improved the area on his lateral foot however continues to be problematic. Patient comes in the clinic today in a new wheelchair he is quite delighted 09/05/16; a wound on the left medial Calf and left dorsal foot are healed. The area on the left lateral foot is improved however the area on the  left medial malleolus is inflamed with a considerable degree of surrounding erythema. This looks like cellulitis. ABIs and clinical exam are within normal limits 09/12/16; the patient continues to have a wound on the left medial calf. I was concerned about cellulitis in this area last week and gave him a course of doxycycline. The wound looks better here. The area on the left lateral foot however is unchanged 09/20/16; the patient came in this week early after traumatizing his toe on the left foot on a table while moving around in his motorized wheelchair. 09/26/16- he is here for follow-up evaluation of his left foot and malleolus ulcers. Since his appointment he abraded his left medial hallux against a table while in his motorized wheelchair, apparently trying to pull up to the table for a meal. He continues to complain of tenderness to the left lateral foot, surrounding the current ulcer 10/07/16; left foot and malleolus ulcers. 10/14/16; the patient is doing well. He has a wound on the left lateral, left medial malleolus and atraumatic wound on the left great toe. Although these appear to be superficial and closing 10/31/16; the patient is doing well. The area on the left lateral foot, left medial malleolus and the left great toe or all epithelialized. He has home health 11/14/16; patient arrives today with the area on the left great toe healed. The left lateral foot however in the left medial malleolus and the ankle superficial wounds that are slightly larger. He has erythema and tenderness around the area on the left lateral foot. As well he is complaining of dysuria. His caregiver notes that when he gets like this he is more lethargic which indeed seems to be the case today 11/21/16; left great toe is still healed. His wound on the left lateral foot appears healthy and slightly smaller also the area on the left medial ankle. This had black eschar on it which I removed. The remaining wound looks  satisfactory 12/12/16; patient hasn't been here in a number of weeks. The area on the left lateral foot is healed however on his medial ankle has 2 open areas one of them covered with a necrotic eschar. He also has a new wound on the right second toe which was apparently trauma induced while he was at a movie 12/27/16; the patient has wounds on his left medial mallelus and left lateral foot that are just about closed. The area on the right second toe which was trauma from last time is still open but again everything looks a lot better here. 01/30/17; patient arrives in clinic today and is really been a long time since I've seen him. The wounds are all epithelialized on the left foot this includes his left lateral foot, left medial malleolus. Apparently was in the ER for what of I think was a fungal infection on his buttock'[name] he was prescribed Diflucan and doxycycline I think this is already getting better. READMISSION Aurther Lofterry is a now 58 year old man who has advanced  cerebral palsy and functional quadriplegia. We have had him in this clinic multiple times before for wounds on his right ankle, left ankle, left dorsal foot and most recently from 08/02/16 through 01/30/17 with his left medial ankle and left foot and finally right second toe. His attendant from the group home where he lives is very knowledgeable about him. She states that they noticed breakdown in his skin in his feet sometime in January although they could not get an appointment until today in this clinic. The previous wounds have always been felt to be secondary to incidental trauma/pressure areas or possibly some degree of chronic venous insufficiency. He had normal arterial studies in may of 2016. He has definite open wounds on the right lateral foot over the base of the fifth metatarsal, the right second toe DIP, the left medial malleolus. He has concerning areas over the left lateral fifth metatarsal base, retaform purpura over the medial  left ankle and perhaps the medial left first toe. 07/21/17; culture we did last week from the base of the right fifth metatarsal grew group G strep. This should've been sensitive to the doxycycline I gave him. The other major wounds are on the right second toe DIP, left medial malleolus, base of the left lateral fifth metatarsal and the medial left ankle and medial left toe. 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 We have macrovascular arterial studies scheduled for Thursday although I don't think this is what the problem is. 07/28/17; patient arrives with really no improvement. He has a wound on the right second toe, lateral right foot, right medial malleolus which was new this week, left medial malleolus, but looked like a DTI on the left great toe. His macrovascular studies are on 07/30/17 although I would be surprised if this provides all the explanation. He has small areas even on his right second and third toe which look like splinter-type hemorrhages. The question I have is where is this coming from. I'd like to see the vascular studies however before we pursue this. He has had a DVT in the past but is no longer on anticoagulants. I'll need to review his history 08/04/17; the patient had his ARTERIAL STUDIES this showed a right ABI of 1.16 and the left ABI of 1.20. TBI on the left at 0.84. He is at a right first toe amputation. The studies were unchanged from studies in May 2016. It was not felt that he had significant lower extremity arterial disease In the meantime we are using silver alginate to the wounds on the right second toe right lateral foot left medial malleolus. I'm going to change to Silver collagen today. 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 Working him up for this would include an echocardiogram probably a CT scan of the chest and  abdomen to look at the major thoracic and abdominal aortic vessels. I am not going to involve myself in this unless there is more symptoms/signs 08/18/17; the patient arrives today with wounds looking somewhat better. The remaining areas are the right second toe dorsal, right lateral foot and left medial malleolus which only has a small open area. We've been using silver collagen 09/01/17;right second toe dorsally probes to bone today. This is a deterioration. We've been using silver collagen. Only a small open area remains on the left medial malleolus. 09/09/17; right second toe appears better. There is no probing bone. He  has a small area remaining on the left medial malleolus, the right lateral foot. We have been using silver collagen 10/07/17; the patient returns to clinic today after a month hiatus. He has wounds over the right second toe, right lateral foot and the left medial malleolus. We have been using silver collagen. He has home health. 10/24/17; patient has open wounds over the right second toe, now the left fourth toe and a superficial area over his left buttock. Some of the other areas have healed including the left medial malleolus and right lateral foot. Using silver collagen on these wounds 11/07/17; the patient has closed his wounds on the right second toe and left fourth toe although they look all normal. Last time he was here he had a superficial area over the left buttock this is also closed. The right lateral foot wound I had closed out last time although it is seems to have reopened today. 628/19 the areas over the right second and left fourth toe remains closed. The right lateral foot has tightly adherent necrotic surface over. We have been using silver collagen he offloads this and a bunny boot 12/12/17; the patient has reopened the recurrent area over the right second PIP joint. 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  He does not have obvious macrovascular disease. The area over the right lateral foot looks about the same as last time tightly adherent necrotic debris. We've been using Hydrofera Blue 12/25/17; 2 week follow-up. The area over the right second PIP is closed however the area on the right lateral foot is worse with surrounding erythema and pain compatible with cellulitis is going to need an antibiotic. AS WELL..... We have looked at his buttocks and lower back. He has 3 stage II ulcers. The exact history here is unclear however he is very disabled man with cerebral palsy. He goes to a day program from the group home in which she lives 5 days a week and I think he is on his buttock all day on these visits. Surrounding the wounds see has extensive stage I injury as well. FINALLY it looks as though he has lost weight. I'm not the only staff member that is recognized this 01/08/18 on evaluation today patient appears to be doing better in regard to his gluteal wounds. In fact everything appears to be healed back here although very dry. He has a couple open sores on the scrotal region other than this it is really his foot that is still giving him the most trouble. He did complete the Keflex unfortunately it seems like there'[name] still a lot of erythema in this in fact looks worse than it did previous. I'm gonna culture this today and likely start with a different antibiotic. 01/16/18; I was reassured that the wounds on his buttocks and sacrum are healed. I did not look at these the day. The culture that was done last week on 01/11/18 of the right lateral foot wounds showed Pseudomonas. Bactrim is not a good choice for this. Ciprofloxacin interacts with his muscle relaxants therefore I prescribed cefdinir 300 twice a day for 7 days. Bactrim coverage of pseudomonas is not reliable although the degree of erythema looks a lot better 01/30/18; the patient has completed his antibiotics. The area does not have infection  on the right lateral foot. The wounds look a lot better. We've been using silver alginate 02/12/18; there is on the right lateral foot. Copious amounts  of surface eschar. Also similarly over the PIP of the right second toe. We've been using silver alginate to date 03/02/2018; the area that remains is on the right lateral foot. The right second toe is healed. 03/16/2018; the area there is original wound on the right lateral foot. Still requiring debridement. We are using Hydrofera Blue. The right second toe is still healed/closed 03/30/2018; right lateral foot there is 2 small open areas remaining. They actually look quite healthy we have been using Hydrofera Blue the right second toe remains closed 04/13/2018; right lateral foot. 1 of the 2 small open areas from last time has closed over. The other had surface slough that I removed with a #3 curette. This looks quite healthy. The right second toe remains closed. We are using Hydrofera Blue 05/01/2018; right lateral foot very superficial areas that look like they are trying to close over. He had a new threatened area on the left medial ankle just below the medial malleolus. I think these represent chronic venous insufficiency areas 05/14/2018; the right right lateral foot looks like it closed over as was predicted last time however he has some dark areas subcutaneously erythema superiorly and this is really very tender. This is either coexistent cellulitis or perhaps a deep tissue injury. The area on the left medial ankle is superficial but has some size. We have been using Hydrofera Blue to both areas. The patient complains of pain in his right foot 06/09/2018; right lateral foot was closed over last time but there was coexistent cellulitis around this. I gave him Levaquin. He also has an area on the left medial malleolus. He has not been back to clinic in over 3-1/2 weeks. We are using Hydrofera Blue I think to the left medial malleolus and silver  alginate to the right lateral foot 1/21; right lateral foot still non-viable debris over this area and the left medial malleolus. Both of these vigorously washed off with antiseptic gauze. We have been using silver alginate. 2/4; right lateral foot this looks somewhat better. Unfortunately the area over the left medial malleolus has deteriorated. Using silver alginate in both area 2/18; both wounds arrived today covered in a lot of necrotic debris. We have been using silver alginate. Clearly not making a lot of progress. 3/3; the patient'[name] right second toe is reopened over the PIP. I am not sure why this is happening. He may have microvascular disease. We have checked his macrovascular flow in the past and found it to be normal. He may need an x-ray. The area on the right lateral foot requires debridement. The area on the left lateral ankle is worse. We have been using PolyMem Ag 08/26/18 on evaluation today patient appears to be doing a little worse in regard to his lower extremities based on what I'm seeing what the nursing staff is telling me. His left medial ankle appears potential to be infected there is your thing on want surrounding the wound. His right second toe is also of concern at this point in my pinion. We have not done x-rays of these areas that may be a good idea to go ahead and proceed with at this point to ensure will not deal with anything such as osteomyelitis. 4/16; it is been a while since I saw this patient. He now has wounds on the right lateral foot, dorsal right second toe, substantially on the left medial malleolus and a small area on the left lateral foot. We have been using PolyMem Silver. Curlex and  Coban. He has home health changing the dressing. Since last time he was here he had a culture of the left medial ankle that showed MRSA. He is completed a course/7 days of doxycycline. XRAYS of the left ankle and right foot that were ordered were negative  for osteomyelitis 4/30; he arrives in clinic today with some maceration around the wound and a lot of tenderness and erythema in a circumferential area around the right lateral foot. He has wound areas on the right lateral foot dorsal right second toe, largely over the left medial malleolus and a small area over the left lateral foot 5/7; completed his antibiotics from last time right lateral foot cellulitis is better and the area on his left knee which we discovered after he had been seen by the provider also is improved. He still has extensive wound areas over the right lateral foot, left medial malleolus, right second toe and left lateral foot although all of these look somewhat better with PolyMem 5/15. Wounds on the right lateral foot, right second toe left medial malleolus and the left lateral foot. All of these look somewhat better. We have been using PolyMem Ag 5/26; the wound on the right lateral foot is almost closed over but he still complains of a lot of pain in this area. He has a eschared area on the right second toe but no open wound.. Also concerning today his original wound on the left medial malleolus looks healthy however just inferior to this almost areas of deep tissue injury and there is an area on the left lateral foot also looks like a deep tissue injury. In the past I have wondered whether he might have cholesterol emboli or some other form of a vasculopathy. I wonder whether this could have something to do with this. 6/2; right lateral foot wound is still open I gave him empiric antibiotics last week without really a lot of change she is still complaining of pain in this area. We x-rayed his feet at some point in April these were negative. It would be difficult to do an MRI. The area on his left medial malleolus looks better. Right second toe is healed. He still has what looks to be a small pressure related area on the left lateral foot but it is not open. We are using  PolyMem to all wound which seems to be doing nicely 6/16; right lateral foot wound is still open. The wound looks better although there is still a lot of tenderness around it. Rather than more empiric antibiotics I have cultured this today. We are using PolyMem Ag. On the left lateral malleolus the wound appears better. He has a new area on the left lower anterior pretibial area 6/29; 2-week follow-up. Culture I did of the right lateral foot 2 weeks ago showed MRSA. I gave him 2 weeks of Septra which he should be just about finishing now and this seems better. We got a call from home health last week to report swelling in the left leg because we were listed as his primary. We referred him back to primary care. He arrives today with intense erythema and tenderness around the 2 wounds on the left medial ankle on the left lower calf. The area on the right lateral foot looks better 7/7; I brought him back this week to follow-up on his left foot which had cellulitis last week. I gave him clindamycin which as far as I know he tolerated. The erythema is better. He has a  3 current wounds one on the right lateral foot a small area on the right lateral malleolus and the more recent area on the left anterior pretibial area. 7/21; bilateral distal lower leg and foot wounds. The area on his right lateral foot looks a lot better. He has an area on his left pretibial area distally. The area on the medial malleolus on the left which is 1 of his worst wounds has closed over. He does not appear to have any open areas in his remaining toes 8/4-Patient returns at 2 weeks, he has the remaining wound on the left lower leg the other 2 wounds have healed. Patient is continued to be treated with silver alginate with a 2 layer wrap on the left 8/18; I been following this patient for a long period for bilateral lower extremity wounds. 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 His wounds are probably pressure although he religiously wears thick bunny boots. He does not have macrovascular disease but I did formal testing on him perhaps 2 or 3 years ago. I have often wondered about either cholesterol emboli or a vasculopathy of some sort given the distal nature of these variable locations etc. He has 2 areas that we have been following one on the left medial lower leg and one on the right lateral foot. Both of these are mostly healed today there is some eschar over sections of both areas although I did not disturb this. 03/31/2019 on evaluation today patient appears to be doing a little bit more poorly in regard to the sacral region. He has 2 small wounds noted at this point. Fortunately there is no signs of active infection at this time. No fevers, chills, nausea, vomiting, or diarrhea. He is having some pain when he sits on his gluteal region a big part of the reason he has these wounds is likely due to the fact that his wheelchair has not been functioning properly. He does still have a couple areas of deep tissue injury on his lower extremities he does need new Prevalon offloading boots as well. Electronic Signature([name]) Signed: 03/31/2019 6:48:11 PM By: [name] Kelp PA-C Entered By: [name] on 03/31/2019 11:21:22 -------------------------------------------------------------------------------- Physical Exam Details Patient Name: Date of Service: [name], [name] 03/31/2019 9:45 AM Medical Record [id number] Patient Account Number: [id number] Date of Birth/Sex: Treating RN: 1960-11-13 (58 y.o. [name] Primary Care Provider: Orpah [name] Other Clinician: Referring Provider: Treating Provider/Extender:[name], [name], [name] in Treatment: 68 Constitutional Well-nourished and well-hydrated in no acute distress. Respiratory normal breathing without difficulty. clear to auscultation  bilaterally. Cardiovascular regular rate and rhythm with normal S1, S2. Psychiatric Patient is not able to cooperate in decision making regarding care. Patient is oriented to person and place. pleasant and cooperative. Notes Upon inspection today patient'[name] wound currently in the sacral region is very small and appears to be nice and granulated at this time there does not appear to be any signs of active infection which is good news overall very pleased in this regard. With regard to his lower extremities again he has somewhat of a bogginess to his heels bilaterally and he does have a couple areas of deep tissue injury which is really not as open at this point but again are threatening to potentially do so. We will definitely have to keep an eye on these areas. Electronic Signature([name]) Signed: 03/31/2019 6:48:11 PM By: [name] Kelp PA-C Entered By: [name]  on 03/31/2019 11:22:10 -------------------------------------------------------------------------------- Physician Orders Details Patient Name: Date of Service: [name], [name] 03/31/2019 9:45 AM Medical Record [id number] Patient Account Number: [id number] Date of Birth/Sex: Treating RN: [birth year] (58 y.o. [name] Primary Care Provider: Orpah [name] Other Clinician: Referring Provider: Treating Provider/Extender:[name], [name], [name] in Treatment: 54 Verbal / Phone Orders: No Diagnosis Coding ICD-10 Coding Code Description 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 breakdown of skin L97.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 layer exposed L97.322 Non-pressure chronic ulcer of left 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 L97.521 Non-pressure chronic ulcer of other part of left foot limited to breakdown of skin Follow-up Appointments Return appointment in 1 month. Dressing Change Frequency Wound #34 Sacrum Change Dressing every other  day. - or as needed Skin Barriers/Peri-Wound Care Barrier cream - to sacrum Moisturizing lotion - both legs daily Wound Cleansing Wound #34 Sacrum May shower and wash wound with soap and water. Primary Wound Dressing Wound #34 Sacrum Other: - foam border Edema Control Elevate legs to the level of the heart or above for 30 minutes daily and/or when sitting, a frequency of: Support Garment 10-20 mm/Hg pressure to: - support hose both legs daily Off-Loading Multipodus Splint to: - sage boots both feet at all times. please float feet to offload pressure to lateral side of feet. Gel mattress overlay (Group 1) - for extended mattress/bed Turn and reposition every 2 hours Home Health Continue Home Health skilled nursing for wound care. Beverly Gust Electronic Signature([name]) Signed: 03/31/2019 6:42:58 PM By: Zenaida Deed RN, BSN Signed: 03/31/2019 6:48:11 PM By: [name] Kelp PA-C Entered By: Zenaida Deed on 03/31/2019 11:19:58 -------------------------------------------------------------------------------- Problem List Details Patient Name: Date of Service: [name], [name] 03/31/2019 9:45 AM Medical Record [id number] Patient Account Number: [id number] Date of Birth/Sex: Treating RN: February 12, 1961 (58 y.o. [name] Primary Care Provider: Orpah [name] Other Clinician: Referring Provider: Treating Provider/Extender:[name], [name], [name] in Treatment: 30 Active Problems ICD-10 Evaluated Encounter Code Description Active Date Today Diagnosis L89.152 Pressure ulcer of sacral region, stage 2 03/31/2019 No Yes Inactive Problems ICD-10 Code Description Active Date Inactive Date L03.116 Cellulitis of left lower limb 08/26/2018 08/26/2018 L89.312 Pressure ulcer of right buttock, stage 2 12/25/2017 12/25/2017 L03.115 Cellulitis of right lower limb 12/25/2017 12/25/2017 I73.89 Other specified peripheral vascular diseases 08/26/2018 08/26/2018 L89.152 Pressure ulcer  of sacral region, stage 2 12/25/2017 12/25/2017 V78.469 Pressure ulcer of left buttock, stage 2 12/25/2017 12/25/2017 L03.116 Cellulitis of left lower limb 11/23/2018 11/23/2018 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 07/14/2017 07/14/2017 breakdown of skin Resolved Problems ICD-10 Code Description Active Date Resolved Date L97.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 12/25/2017 12/25/2017 layer exposed L97.322 Non-pressure chronic ulcer of left 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 05/01/2018 05/01/2018 L97.521 Non-pressure chronic ulcer of other part of left foot limited to 11/10/2018 11/10/2018 breakdown of skin Electronic Signature([name]) Signed: 03/31/2019 6:48:11 PM By: [name] Kelp PA-C Entered By: [name] on 03/31/2019 11:24:36 -------------------------------------------------------------------------------- Progress Note Details Patient Name: Date of Service: [name], [name] 03/31/2019 9:45 AM Medical Record [id number] Patient Account Number: [id number] Date of Birth/Sex: Treating RN: Mar 20, 1961 (59 y.o. [name] Primary Care Provider: Orpah [name] Other Clinician: Referring Provider: Treating Provider/Extender:[name], [name], [name] in Treatment: 36 Subjective Chief Complaint Information obtained from Patient He is here for follow evaluation for ulcers to his left  foot/malleolus 07/14/17; patient is here for multiple wounds on his bilateral feet and left ankle. History of Present Illness (HPI) 11/23/15; this is a patient I remember from a previous stay in this clinic in 2012. The patient says this was for a wound in this same area as currently although I have no information to verify that. Most of the information is provided by his caregiver from the group home where he resides. Apparently he has had a wound in this area for 8 months. He also has erythema around this area and has had multiple rounds  of antibiotics apparently with some improvement but the erythema is persists. He apparently had a fracture in the ankle 2 months ago and was seen by Dr. Lajoyce [name] . He apparently had a splint applied and more recently a heel offloading boot. He had home health coming out to a week ago not clear what they are dressing this with but they apparently discharged him perhaps because of one of Dr. Audrie [name] socks The patient has cerebral palsy and has a functional quadriparetic. He has a Nurse, adult for transferring at home he is nonambulatory and does not wear footwear. He is not a diabetic. Looking through [facility] he appears to have fairly active primary care. The area on the ankle is variably labeled as a pressure area and/or chronic venous insufficiency. He had a recent venous ultrasound on 5/25 that did not show a DVT in the left leg however this was not a reflux study. There was no superficial DVT either. Arterial studies on 10/15/14 showed a left ABI of 1.16 Center right of 1.12 waveforms were triphasic he does not have significant arterial disease. He has had recent x-rays of the left foot and ankle. The left foot did not show any abnormality. Left ankle x-ray showed a spiral fracture of the left tibial diaphysis without evidence of osteomyelitis. 11/30/15 culture I did last week showed pseudomonas I changed him to oral ciprofloxacin. Erythema is a lot better. 12/07/15 area around the wound shows considerable improvement of the periwound erythema. 12/14/15; the patient has a improvement of the periwound erythema which in retrospect I think with cellulitis successfully treated. We have been using Iodoflex started last week 12/25/15 wound on the left medial malleolus and dorsal left foot. Theraskin #1 applied READMISSION 02/27/16; the patient was admitted to hospital from 8/14 through 01/18/16 initially felt to have cellulitis of his left lower leg however he was noted to have significant persistent pain  and swelling. A duplex ultrasound was ordered that was negative. [name] an x-ray was done that showed a spiral fracture of the left leg. With posterior displacement and angulation at the mid left tibia. Orthopedics saw the patient and splinted the leg. He was then sent to [facility] skilled facility and only return to his group home last week. He has wounds on the left medial malleolus, left dorsal foot and a worrisome area on the plantar left heel which is not really open but may represent a hematoma or a DTI 03/06/16; areas of left anterior and medial ankle both look improved. There are 3 small wounds here. He has a area over the dorsal left first metatarsal head which is not open. In meantime his left heel has opened 03/14/16 the medial left ankle wounds looks stable to improved. The area on the posterior left heel has opened up and had a very adherent eschar and slept over this 03/21/16 patient has 3 wounds on his left medial ankle is stable  area on his left dorsal first metatarsal head appears to be improved. Continued problems with adherent eschar over the left heel 03/28/16; patient has 3 wounds on his left medial ankle and lower leg all of these appear to be stable. The problem continues to be the left heel and the adherent eschar. This may have been a pressure ulcer from her brace at one point. He has a English as a second language teacher we have been using [employer] and all wound areas. Dressings are being changed by home health 04/04/16; the patient'[name] areas on the medial aspect of the left ankle looks superficial and appear to be gradually closing. The area on the plantar aspects/Achilles aspect of his left heel as surface slough and nonviable subcutaneous tissue requiring debridement. 04/11/16; the areas on the patient'[name] medial left ankle appear to be superficial and appear to be closing. The area on the plantar heel on the left has surface slough and nonviable subcutaneous tissue requiring debridement. I  don't think this is too much different from last week there is also undermining 04/25/16; patient is followed in his group home by [facility] home health. The area on the plantar heel is smaller but with still with some depth. We're using Iodoflex to this. The areas on the left ankle looks healthy but dry and somewhat larger. Cause of this is not really clear using Prisma 05/02/16; areas x3 all look improved expecially the areas on the medial ankle. Heel wound is smaller but still deep 05/09/16; all areas o3 look improved on the medial ankle. Heel wound is also contracting. 12/0.9/17 the areas on his left medial ankle are all closed. I think these are largely chronic venous insufficiency however there is no edema. The area on his left heel was a pressure area. READMISSION 08/02/16; this man is a patient that we have had in this clinic on several times before. I believe he has cerebral palsy is nonambulatory. He has chronic venous insufficiency with venous inflammation. He was last year in the fall of 2017 with areas on his left medial ankle left heel and left dorsal foot. He arrives today with once again a vague history. As mentioned he is nonambulatory he spends most of his day in a pressure offloading boot nevertheless they noted wounds in this area on the medial left ankle 2 weeks ago. There is any other history here I am uncertain 08/15/16; the patient'[name] wounds on his medial ankle actually look better however the area on the left lateral/dorsal foot is covered in black necrotic eschar. Previous formal arterial studies did not show significant arterial disease with normal ABIs and triphasic waveforms. He is immobilized and at risk for pressure although he seems aware protective boots at all times as far as I am aware. 08/22/16; in general his wounds look better. All the wounds on his medial left ankle look improved the area on his lateral foot however continues to be problematic. Patient comes in  the clinic today in a new wheelchair he is quite delighted 09/05/16; a wound on the left medial Calf and left dorsal foot are healed. The area on the left lateral foot is improved however the area on the left medial malleolus is inflamed with a considerable degree of surrounding erythema. This looks like cellulitis. ABIs and clinical exam are within normal limits 09/12/16; the patient continues to have a wound on the left medial calf. I was concerned about cellulitis in this area last week and gave him a course of doxycycline. The wound looks better  here. The area on the left lateral foot however is unchanged 09/20/16; the patient came in this week early after traumatizing his toe on the left foot on a table while moving around in his motorized wheelchair. 09/26/16- he is here for follow-up evaluation of his left foot and malleolus ulcers. Since his appointment he abraded his left medial hallux against a table while in his motorized wheelchair, apparently trying to pull up to the table for a meal. He continues to complain of tenderness to the left lateral foot, surrounding the current ulcer 10/07/16; left foot and malleolus ulcers. 10/14/16; the patient is doing well. He has a wound on the left lateral, left medial malleolus and atraumatic wound on the left great toe. Although these appear to be superficial and closing 10/31/16; the patient is doing well. The area on the left lateral foot, left medial malleolus and the left great toe or all epithelialized. He has home health 11/14/16; patient arrives today with the area on the left great toe healed. The left lateral foot however in the left medial malleolus and the ankle superficial wounds that are slightly larger. He has erythema and tenderness around the area on the left lateral foot. As well he is complaining of dysuria. His caregiver notes that when he gets like this he is more lethargic which indeed seems to be the case today 11/21/16; left great toe  is still healed. His wound on the left lateral foot appears healthy and slightly smaller also the area on the left medial ankle. This had black eschar on it which I removed. The remaining wound looks satisfactory 12/12/16; patient hasn't been here in a number of weeks. The area on the left lateral foot is healed however on his medial ankle has 2 open areas one of them covered with a necrotic eschar. He also has a new wound on the right second toe which was apparently trauma induced while he was at a movie 12/27/16; the patient has wounds on his left medial mallelus and left lateral foot that are just about closed. The area on the right second toe which was trauma from last time is still open but again everything looks a lot better here. 01/30/17; patient arrives in clinic today and is really been a long time since I've seen him. The wounds are all epithelialized on the left foot this includes his left lateral foot, left medial malleolus. Apparently was in the ER for what of I think was a fungal infection on his buttock'[name] he was prescribed Diflucan and doxycycline I think this is already getting better. READMISSION [name] is a now 58 year old man who has advanced cerebral palsy and functional quadriplegia. We have had him in this clinic multiple times before for wounds on his right ankle, left ankle, left dorsal foot and most recently from 08/02/16 through 01/30/17 with his left medial ankle and left foot and finally right second toe. His attendant from the group home where he lives is very knowledgeable about him. She states that they noticed breakdown in his skin in his feet sometime in January although they could not get an appointment until today in this clinic. The previous wounds have always been felt to be secondary to incidental trauma/pressure areas or possibly some degree of chronic venous insufficiency. He had normal arterial studies in may of 2016. He has definite open wounds on the right lateral  foot over the base of the fifth metatarsal, the right second toe DIP, the left medial malleolus. He has concerning  areas over the left lateral fifth metatarsal base, retaform purpura over the medial left ankle and perhaps the medial left first toe. 07/21/17; culture we did last week from the base of the right fifth metatarsal grew group G strep. This should've been sensitive to the doxycycline I gave him. The other major wounds are on the right second toe DIP, left medial malleolus, base of the left lateral fifth metatarsal and the medial left ankle and medial left toe. 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 We have macrovascular arterial studies scheduled for Thursday although I don't think this is what the problem is. 07/28/17; patient arrives with really no improvement. He has a wound on the right second toe, lateral right foot, right medial malleolus which was new this week, left medial malleolus, but looked like a DTI on the left great toe. His macrovascular studies are on 07/30/17 although I would be surprised if this provides all the explanation. He has small areas even on his right second and third toe which look like splinter-type hemorrhages. The question I have is where is this coming from. I'd like to see the vascular studies however before we pursue this. He has had a DVT in the past but is no longer on anticoagulants. I'll need to review his history 08/04/17; the patient had his ARTERIAL STUDIES this showed a right ABI of 1.16 and the left ABI of 1.20. TBI on the left at 0.84. He is at a right first toe amputation. The studies were unchanged from studies in May 2016. It was not felt that he had significant lower extremity arterial disease In the meantime we are using silver alginate to the wounds on the right second toe right lateral foot left medial malleolus. I'm going to change to Silver collagen today. The patient had a multiplicity of wounds presenting initially on  his bilateral feet that it healed I suspect this is probably atherosclerotic emboli [cholesterol emboli]. Working him up for this would include an echocardiogram probably a CT scan of the chest and abdomen to look at the major thoracic and abdominal aortic vessels. I am not going to involve myself in this unless there is more symptoms/signs 08/18/17; the patient arrives today with wounds looking somewhat better. The remaining areas are the right second toe dorsal, right lateral foot and left medial malleolus which only has a small open area. We've been using silver collagen 09/01/17;right second toe dorsally probes to bone today. This is a deterioration. We've been using silver collagen. Only a small open area remains on the left medial malleolus. 09/09/17; right second toe appears better. There is no probing bone. He has a small area remaining on the left medial malleolus, the right lateral foot. We have been using silver collagen 10/07/17; the patient returns to clinic today after a month hiatus. He has wounds over the right second toe, right lateral foot and the left medial malleolus. We have been using silver collagen. He has home health. 10/24/17; patient has open wounds over the right second toe, now the left fourth toe and a superficial area over his left buttock. Some of the other areas have healed including the left medial malleolus and right lateral foot. Using silver collagen on these wounds 11/07/17; the patient has closed his wounds on the right second toe and left fourth toe although they look all normal. Last time he was here he had a superficial area over the left buttock this is also closed. The right lateral foot wound I had  closed out last time although it is seems to have reopened today. 628/19 the areas over the right second and left fourth toe remains closed. The right lateral foot has tightly adherent necrotic surface over. We have been using silver collagen he offloads this and a  bunny boot 12/12/17; the patient has reopened the recurrent area over the right second PIP joint. 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 He does not have obvious macrovascular disease. The area over the right lateral foot looks about the same as last time tightly adherent necrotic debris. We've been using Hydrofera Blue 12/25/17; 2 week follow-up. The area over the right second PIP is closed however the area on the right lateral foot is worse with surrounding erythema and pain compatible with cellulitis is going to need an antibiotic. ooAS WELL..... We have looked at his buttocks and lower back. He has 3 stage II ulcers. The exact history here is unclear however he is very disabled man with cerebral palsy. He goes to a day program from the group home in which she lives 5 days a week and I think he is on his buttock all day on these visits. Surrounding the wounds see has extensive stage I injury as well. ooFINALLY it looks as though he has lost weight. I'm not the only staff member that is recognized this 01/08/18 on evaluation today patient appears to be doing better in regard to his gluteal wounds. In fact everything appears to be healed back here although very dry. He has a couple open sores on the scrotal region other than this it is really his foot that is still giving him the most trouble. He did complete the Keflex unfortunately it seems like there'[name] still a lot of erythema in this in fact looks worse than it did previous. I'm gonna culture this today and likely start with a different antibiotic. 01/16/18; I was reassured that the wounds on his buttocks and sacrum are healed. I did not look at these the day. The culture that was done last week on 01/11/18 of the right lateral foot wounds showed Pseudomonas. Bactrim is not a good choice for this. Ciprofloxacin interacts with his muscle relaxants therefore I prescribed cefdinir 300  twice a day for 7 days. Bactrim coverage of pseudomonas is not reliable although the degree of erythema looks a lot better 01/30/18; the patient has completed his antibiotics. The area does not have infection on the right lateral foot. The wounds look a lot better. We've been using silver alginate 02/12/18; there is on the right lateral foot. Copious amounts of surface eschar. Also similarly over the PIP of the right second toe. We've been using silver alginate to date 03/02/2018; the area that remains is on the right lateral foot. The right second toe is healed. 03/16/2018; the area there is original wound on the right lateral foot. Still requiring debridement. We are using Hydrofera Blue. The right second toe is still healed/closed 03/30/2018; right lateral foot there is 2 small open areas remaining. They actually look quite healthy we have been using Hydrofera Blue the right second toe remains closed 04/13/2018; right lateral foot. 1 of the 2 small open areas from last time has closed over. The other had surface slough that I removed with a #3 curette. This looks quite healthy. The right second toe remains closed. We are using Hydrofera Blue 05/01/2018; right lateral foot very superficial areas that look like they are  trying to close over. He had a new threatened area on the left medial ankle just below the medial malleolus. I think these represent chronic venous insufficiency areas 05/14/2018; the right right lateral foot looks like it closed over as was predicted last time however he has some dark areas subcutaneously erythema superiorly and this is really very tender. This is either coexistent cellulitis or perhaps a deep tissue injury. The area on the left medial ankle is superficial but has some size. We have been using Hydrofera Blue to both areas. The patient complains of pain in his right foot 06/09/2018; right lateral foot was closed over last time but there was coexistent cellulitis  around this. I gave him Levaquin. He also has an area on the left medial malleolus. He has not been back to clinic in over 3-1/2 weeks. We are using Hydrofera Blue I think to the left medial malleolus and silver alginate to the right lateral foot 1/21; right lateral foot still non-viable debris over this area and the left medial malleolus. Both of these vigorously washed off with antiseptic gauze. We have been using silver alginate. 2/4; right lateral foot this looks somewhat better. Unfortunately the area over the left medial malleolus has deteriorated. Using silver alginate in both area 2/18; both wounds arrived today covered in a lot of necrotic debris. We have been using silver alginate. Clearly not making a lot of progress. 3/3; the patient'[name] right second toe is reopened over the PIP. I am not sure why this is happening. He may have microvascular disease. We have checked his macrovascular flow in the past and found it to be normal. He may need an x-ray. The area on the right lateral foot requires debridement. The area on the left lateral ankle is worse. We have been using PolyMem Ag 08/26/18 on evaluation today patient appears to be doing a little worse in regard to his lower extremities based on what I'm seeing what the nursing staff is telling me. His left medial ankle appears potential to be infected there is your thing on want surrounding the wound. His right second toe is also of concern at this point in my pinion. We have not done x-rays of these areas that may be a good idea to go ahead and proceed with at this point to ensure will not deal with anything such as osteomyelitis. 4/16; it is been a while since I saw this patient. He now has wounds on the right lateral foot, dorsal right second toe, substantially on the left medial malleolus and a small area on the left lateral foot. We have been using PolyMem Silver. Curlex and Coban. He has home health changing the dressing. Since last  time he was here he had a culture of the left medial ankle that showed MRSA. He is completed a course/7 days of doxycycline. XRAYS of the left ankle and right foot that were ordered were negative for osteomyelitis 4/30; he arrives in clinic today with some maceration around the wound and a lot of tenderness and erythema in a circumferential area around the right lateral foot. He has wound areas on the right lateral foot dorsal right second toe, largely over the left medial malleolus and a small area over the left lateral foot 5/7; completed his antibiotics from last time right lateral foot cellulitis is better and the area on his left knee which we discovered after he had been seen by the provider also is improved. He still has extensive wound areas over the  right lateral foot, left medial malleolus, right second toe and left lateral foot although all of these look somewhat better with PolyMem 5/15. Wounds on the right lateral foot, right second toe left medial malleolus and the left lateral foot. All of these look somewhat better. We have been using PolyMem Ag 5/26; the wound on the right lateral foot is almost closed over but he still complains of a lot of pain in this area. He has a eschared area on the right second toe but no open wound.[name] concerning today his original wound on the left medial malleolus looks healthy however just inferior to this almost areas of deep tissue injury and there is an area on the left lateral foot also looks like a deep tissue injury. In the past I have wondered whether he might have cholesterol emboli or some other form of a vasculopathy. I wonder whether this could have something to do with this. 6/2; right lateral foot wound is still open I gave him empiric antibiotics last week without really a lot of change she is still complaining of pain in this area. We x-rayed his feet at some point in April these were negative. It would be difficult to do an MRI.  The area on his left medial malleolus looks better. Right second toe is healed. He still has what looks to be a small pressure related area on the left lateral foot but it is not open. We are using PolyMem to all wound which seems to be doing nicely 6/16; right lateral foot wound is still open. The wound looks better although there is still a lot of tenderness around it. Rather than more empiric antibiotics I have cultured this today. We are using PolyMem Ag. On the left lateral malleolus the wound appears better. He has a new area on the left lower anterior pretibial area 6/29; 2-week follow-up. Culture I did of the right lateral foot 2 weeks ago showed MRSA. I gave him 2 weeks of Septra which he should be just about finishing now and this seems better. We got a call from home health last week to report swelling in the left leg because we were listed as his primary. We referred him back to primary care. He arrives today with intense erythema and tenderness around the 2 wounds on the left medial ankle on the left lower calf. The area on the right lateral foot looks better 7/7; I brought him back this week to follow-up on his left foot which had cellulitis last week. I gave him clindamycin which as far as I know he tolerated. The erythema is better. He has a 3 current wounds one on the right lateral foot a small area on the right lateral malleolus and the more recent area on the left anterior pretibial area. 7/21; bilateral distal lower leg and foot wounds. The area on his right lateral foot looks a lot better. He has an area on his left pretibial area distally. The area on the medial malleolus on the left which is 1 of his worst wounds has closed over. He does not appear to have any open areas in his remaining toes 8/4-Patient returns at 2 weeks, he has the remaining wound on the left lower leg the other 2 wounds have healed. Patient is continued to be treated with silver alginate with a 2 layer  wrap on the left 8/18; I been following this patient for a long period for bilateral lower extremity wounds. 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 His wounds are probably pressure although he religiously wears thick bunny boots. He does not have macrovascular disease but I did formal testing on him perhaps 2 or 3 years ago. I have often wondered about either cholesterol emboli or a vasculopathy of some sort given the distal nature of these variable locations etc. He has 2 areas that we have been following one on the left medial lower leg and one on the right lateral foot. Both of these are mostly healed today there is some eschar over sections of both areas although I did not disturb this. 03/31/2019 on evaluation today patient appears to be doing a little bit more poorly in regard to the sacral region. He has 2 small wounds noted at this point. Fortunately there is no signs of active infection at this time. No fevers, chills, nausea, vomiting, or diarrhea. He is having some pain when he sits on his gluteal region a big part of the reason he has these wounds is likely due to the fact that his wheelchair has not been functioning properly. He does still have a couple areas of deep tissue injury on his lower extremities he does need new Prevalon offloading boots as well. Patient History Information obtained from Patient. Social History Never smoker, Marital Status - Single, Alcohol Use - Never, Drug Use - No History, Caffeine Use - Moderate. Medical History Eyes Denies history of Cataracts, Glaucoma, Optic Neuritis Ear/Nose/Mouth/Throat Denies history of Chronic sinus problems/congestion, Middle ear problems Cardiovascular Patient has history of Deep Vein Thrombosis Gastrointestinal Patient has history of Colitis Endocrine Denies history of Type I Diabetes, Type II Diabetes Genitourinary Denies history of End Stage Renal  Disease Immunological Denies history of Lupus Erythematosus, Raynaudoos, Scleroderma Musculoskeletal Patient has history of Osteomyelitis Neurologic Patient has history of Neuropathy, Quadriplegia - spastic Oncologic Denies history of Received Chemotherapy, Received Radiation Psychiatric Patient has history of Confinement Anxiety Denies history of Anorexia/bulimia Hospitalization/Surgery History - surgery to straughten legs. 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 - hiatal hernia repair. - (R) gt toe amputation. - Constipation. Medical And Surgical History Notes Co-Morbid Conditions h/o spiral fracture distal (L) tibia , h/o shingles , MR , infantile CP Ear/Nose/Mouth/Throat hiatal hernia Respiratory h/o pneumonia , chronic bronchitis Cardiovascular hypercholesterolemia Gastrointestinal acute gastric ulcer with hemorrhage , constipation , GERD , Abdominal Distention Endocrine recurrent UTI'[name] Genitourinary incontinence (on Detrol) Renal Glycosuria Integumentary (Skin) Eczema Acne Vulgaris Onychomycosis Pressure Ulcers Musculoskeletal DJD of shoulder Neurologic cerebral palsy Psychiatric general anxiety , bipolar , depression, Mental Retardation Review of Systems (ROS) Constitutional Symptoms (General Health) Denies complaints or symptoms of Fatigue, Fever, Chills, Marked Weight Change. Respiratory Denies complaints or symptoms of Chronic or frequent coughs, Shortness of Breath. Cardiovascular Denies complaints or symptoms of Chest pain. Psychiatric Denies complaints or symptoms of Claustrophobia, Suicidal. Objective Constitutional Well-nourished and well-hydrated in no acute distress. Vitals Time Taken: 10:10 AM, Height: 73 in, Weight: 178 lbs, BMI: 23.5, Temperature: 97.8 F, Pulse: 91 bpm, Respiratory Rate: 16 breaths/min, Blood Pressure: 131/95 mmHg. Respiratory normal breathing without difficulty. clear to auscultation bilaterally. Cardiovascular regular rate and  rhythm with normal S1, S2. Psychiatric Patient is not able to cooperate in decision making regarding care. Patient is oriented to person and place. pleasant and cooperative. General Notes: Upon inspection today patient'[name] wound currently in the sacral region is very small and appears to be nice and granulated at this time there does not appear to be any signs of active infection  which is good news overall very pleased in this regard. With regard to his lower extremities again he has somewhat of a bogginess to his heels bilaterally and he does have a couple areas of deep tissue injury which is really not as open at this point but again are threatening to potentially do so. We will definitely have to keep an eye on these areas. Integumentary (Hair, Skin) Wound #32 status is Open. Original cause of wound was Gradually Appeared. The wound is located on the Left,Medial Lower Leg. The wound measures 0cm length x 0cm width x 0cm depth; 0cm^2 area and 0cm^3 volume. There is no tunneling or undermining noted. There is a none present amount of drainage noted. The wound margin is flat and intact. There is no granulation within the wound bed. There is no necrotic tissue within the wound bed. Wound #33 status is Open. Original cause of wound was Gradually Appeared. The wound is located on the Right,Lateral Foot. The wound measures 0cm length x 0cm width x 0cm depth; 0cm^2 area and 0cm^3 volume. There is no tunneling or undermining noted. There is a none present amount of drainage noted. The wound margin is flat and intact. There is no granulation within the wound bed. There is no necrotic tissue within the wound bed. Wound #34 status is Open. Original cause of wound was Pressure Injury. The wound is located on the Sacrum. The wound measures 0.2cm length x 0.2cm width x 0.1cm depth; 0.031cm^2 area and 0.003cm^3 volume. There is Fat Layer (Subcutaneous Tissue) Exposed exposed. There is no tunneling or  undermining noted. There is a small amount of serosanguineous drainage noted. The wound margin is flat and intact. There is large (67-100%) pink granulation within the wound bed. There is no necrotic tissue within the wound bed. Assessment Active Problems ICD-10 Pressure ulcer of sacral region, stage 2 Plan Follow-up Appointments: Return appointment in 1 month. Dressing Change Frequency: Wound #34 Sacrum: Change Dressing every other day. - or as needed Skin Barriers/Peri-Wound Care: Barrier cream - to sacrum Moisturizing lotion - both legs daily Wound Cleansing: Wound #34 Sacrum: May shower and wash wound with soap and water. Primary Wound Dressing: Wound #34 Sacrum: Other: - foam border Edema Control: Elevate legs to the level of the heart or above for 30 minutes daily and/or when sitting, a frequency of: Support Garment 10-20 mm/Hg pressure to: - support hose both legs daily Off-Loading: Multipodus Splint to: - sage boots both feet at all times. please float feet to offload pressure to lateral side of feet. Gel mattress overlay (Group 1) - for extended mattress/bed Turn and reposition every 2 hours Home Health: [facility] for wound care. - [facility] my suggestion is good to be that we go ahead and initiate a protective dressing for the sacral region which is really the only open area at this point they may put some zinc underneath this as well as they do feel like that has been beneficial I am definitely okay with that. 2. With regard to the multiple deep tissue injuries over the lower extremities there really does not appear to be thing open at this point I think protecting the area specifically with the Prevalon offloading boots will be beneficial. 3. I do recommend appropriate offloading at this time including rotation of sitting and lying positions especially in light of the fact that his wheelchair is broken and they are working on getting the  joystick fixed. Therefore he is not in his  fairly new motorized wheelchair at this point. We will see patient back for reevaluation in 1 month here in the clinic. If anything worsens or changes patient will contact our office for additional recommendations. Electronic Signature([name]) Signed: 03/31/2019 6:48:11 PM By: [name] Kelp PA-C Entered By: [name] on 03/31/2019 11:24:47 -------------------------------------------------------------------------------- HxROS Details Patient Name: Date of Service: [name], [name] 03/31/2019 9:45 AM Medical Record [id number] Patient Account Number: [id number] Date of Birth/Sex: Treating RN: May 16, 1961 (58 y.o. [name] Primary Care Provider: Orpah [name] Other Clinician: Referring Provider: Treating Provider/Extender:[name], [name], [name] in Treatment: 18 Information Obtained From Patient Constitutional Symptoms (General Health) Complaints and Symptoms: Negative for: Fatigue; Fever; Chills; Marked Weight Change Respiratory Complaints and Symptoms: Negative for: Chronic or frequent coughs; Shortness of Breath Medical History: Past Medical History Notes: h/o pneumonia , chronic bronchitis Cardiovascular Complaints and Symptoms: Negative for: Chest pain Medical History: Positive for: Deep Vein Thrombosis Past Medical History Notes: hypercholesterolemia Psychiatric Complaints and Symptoms: Negative for: Claustrophobia; Suicidal Medical History: Positive for: Confinement Anxiety Negative for: Anorexia/bulimia Past Medical History Notes: general anxiety , bipolar , depression, Mental Retardation Co-Morbid Conditions Medical History: Past Medical History Notes: h/o spiral fracture distal (L) tibia , h/o shingles , MR , infantile CP Eyes Medical History: Negative for: Cataracts; Glaucoma; Optic Neuritis Ear/Nose/Mouth/Throat Medical History: Negative for: Chronic sinus problems/congestion;  Middle ear problems Past Medical History Notes: hiatal hernia Gastrointestinal Medical History: Positive for: Colitis Past Medical History Notes: acute gastric ulcer with hemorrhage , constipation , GERD , Abdominal Distention Endocrine Medical History: Negative for: Type I Diabetes; Type II Diabetes Past Medical History Notes: recurrent UTI'[name] Genitourinary Medical History: Negative for: End Stage Renal Disease Past Medical History Notes: incontinence (on Detrol) Renal Glycosuria Immunological Medical History: Negative for: Lupus Erythematosus; Raynauds; Scleroderma Integumentary (Skin) Medical History: Past Medical History Notes: Eczema Acne Vulgaris Onychomycosis Pressure Ulcers Musculoskeletal Medical History: Positive for: Osteomyelitis Past Medical History Notes: DJD of shoulder Neurologic Medical History: Positive for: Neuropathy; Quadriplegia - spastic Past Medical History Notes: cerebral palsy Oncologic Medical History: Negative for: Received Chemotherapy; Received Radiation Immunizations Pneumococcal Vaccine: Received Pneumococcal Vaccination: Yes Implantable Devices No devices added Hospitalization / Surgery History Type of Hospitalization/Surgery surgery to straughten legs 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 Electronic Signature([name]) Signed: 03/31/2019 6:42:58 PM By: Zenaida Deed RN, BSN Signed: 03/31/2019 6:48:11 PM By: [name] Kelp PA-C Entered By: [name] on 03/31/2019  11:21:37 -------------------------------------------------------------------------------- SuperBill Details Patient Name: Date of Service: ELMOR, KOST 03/31/2019 Medical Record [id number] Patient Account Number: [id number] Date of Birth/Sex: Treating RN: 1961/04/10 (58 y.o. [name] Primary Care Provider: Orpah [name] Other Clinician: Referring Provider: Treating Provider/Extender:[name], [name], [name] in Treatment: 70 Diagnosis Coding ICD-10 Codes Code Description L89.152 Pressure ulcer of sacral region, stage 2 Facility Procedures CPT4 Code: [id number] Description: 99214 - WOUND CARE VISIT-LEV 4 EST PT Modifier: Quantity: 1 Physician Procedures CPT4 Code: [id number] Description: 99214 - WC PHYS LEVEL 4 - EST PT ICD-10 Diagnosis Description L89.152 Pressure ulcer of sacral region, stage 2 Modifier: Quantity: 1 Electronic Signature([name]) Signed: 03/31/2019 6:48:11 PM By: [name] Kelp PA-C Entered By: [name] on  03/31/2019 11:25:00 

## 2019-04-05 NOTE — Progress Notes (Signed)
Simple Wound Cleansing - one wound 0 X - Complex Wound Cleansing - multiple wounds 3 5 X - Wound Imaging (photographs - any number of wounds) 1 5 _0  - Wound Tracing (instead of photographs) 0 _1  - Simple Wound Measurement - one wound 0 X - Complex Wound Measurement - multiple wounds 3 5 INTERVENTIONS - Wound Dressings X - Small Wound Dressing one or multiple wounds 1 10 _2  - Medium Wound Dressing one or multiple wounds 0 _3  - Large Wound Dressing one or multiple wounds 0 X - Application of Medications - topical 1 5 <[id number]>_2<\/WLNLGXQJJHERDEYC>_1  - Application of Medications - injection 0 INTERVENTIONS - Miscellaneous _5  - External ear exam 0 _6  - Specimen Collection (cultures, biopsies, blood, body fluids, etc.) 0 _7  - Specimen(s) / Culture(s) sent or taken to Lab for analysis 0 _8  - Patient Transfer (multiple staff / Civil Service fast streamer / Similar devices) 0 _9  - Simple Staple / Suture removal (25 or less) 0 _10  - Complex Staple / Suture removal (26 or more) 0 _11  - Hypo / Hyperglycemic Management (close monitor of Blood Glucose) 0 _12  - Ankle / Brachial Index (ABI) - do not check if billed separately 0 X - Vital Signs 1 5 Has the patient been seen at the hospital within the last three years: Yes Total Score: 135 Level Of Care: New/Established - Level 4 Electronic Signature(s) Signed: 03/31/2019 6:42:58 PM By: [name] Gouty RN, BSN Entered By: [name] on 03/31/2019 11:19:31 -------------------------------------------------------------------------------- Encounter Discharge Information Details Patient Name: Date of Service: [name] 03/31/2019 9:45 AM Medical Record [id number] Patient Account Number: [id number] Date of Birth/Sex: Treating RN: [name] (58 y.o. [name] Primary Care [name]: [name] Other  Clinician: Referring [name]: Treating [name]/Extender:[name], [name], [name] in Treatment: 29 Encounter Discharge Information Items Discharge Condition: Stable Ambulatory Status: Wheelchair Discharge Destination: Home Transportation: Private Auto Accompanied By: caregiver Schedule Follow-up Appointment: Yes Clinical Summary of Care: Electronic Signature(s) Signed: 03/31/2019 6:30:51 PM By: [name] Entered By: [name] on 03/31/2019 15:04:35 -------------------------------------------------------------------------------- Lower Extremity Assessment Details Patient Name: Date of Service: [name], [name] 03/31/2019 9:45 AM Medical Record [id number] Patient Account Number: [id number] Date of Birth/Sex: Treating RN: [name] (58 y.o. [name] Primary Care [name]: [name] Other Clinician: Referring [name]: Treating [name]/Extender:[name], [name], [name] in Treatment: 89 Edema Assessment Assessed: [Left: No] [Right: No] Edema: [Left: No] [Right: No] Calf Left: Right: Point of Measurement: cm From Medial Instep 23.6 cm 24.8 cm Ankle Left: Right: Point of Measurement: cm From Medial Instep 16.8 cm 16.7 cm Vascular Assessment Pulses: Dorsalis Pedis Palpable: [Left:Yes] [Right:Yes] Electronic Signature(s) Signed: 04/05/2019 5:46:53 PM By: [name] Hurst RN, BSN Entered By: [name] on 03/31/2019 10:31:23 -------------------------------------------------------------------------------- [name] Details Patient Name: Date of Service: [name], [name] 03/31/2019 9:45 AM Medical Record [id number] Patient Account Number: [id number] Date of Birth/Sex: Treating RN: [birth year][name] (58 y.o. [name] Primary Care Reily Ilic: [name] Other Clinician: Referring Delance Weide: Treating [name]/Extender:[name], [name], [name] in Treatment: 41 Active  Inactive Pressure Nursing Diagnoses: Knowledge deficit related to causes and risk factors for pressure ulcer development Knowledge deficit related to management of pressures ulcers Potential for impaired tissue integrity related to pressure, friction, moisture, and shear Goals: Patient will remain free from development of additional pressure ulcers Date Initiated: 07/14/2017 Date Inactivated: 12/25/2017 Target Resolution Date: 12/19/2017 Unmet Reason: new Goal Status: Unmet pressure ulcers Patient/caregiver will verbalize risk factors for pressure ulcer development Date Initiated: 07/14/2017 Date  Simple Wound Cleansing - one wound 0 X - Complex Wound Cleansing - multiple wounds 3 5 X - Wound Imaging (photographs - any number of wounds) 1 5 _0  - Wound Tracing (instead of photographs) 0 _1  - Simple Wound Measurement - one wound 0 X - Complex Wound Measurement - multiple wounds 3 5 INTERVENTIONS - Wound Dressings X - Small Wound Dressing one or multiple wounds 1 10 _2  - Medium Wound Dressing one or multiple wounds 0 _3  - Large Wound Dressing one or multiple wounds 0 X - Application of Medications - topical 1 5 <[id number]>_2<\/WLNLGXQJJHERDEYC>_1  - Application of Medications - injection 0 INTERVENTIONS - Miscellaneous _5  - External ear exam 0 _6  - Specimen Collection (cultures, biopsies, blood, body fluids, etc.) 0 _7  - Specimen(s) / Culture(s) sent or taken to Lab for analysis 0 _8  - Patient Transfer (multiple staff / Civil Service fast streamer / Similar devices) 0 _9  - Simple Staple / Suture removal (25 or less) 0 _10  - Complex Staple / Suture removal (26 or more) 0 _11  - Hypo / Hyperglycemic Management (close monitor of Blood Glucose) 0 _12  - Ankle / Brachial Index (ABI) - do not check if billed separately 0 X - Vital Signs 1 5 Has the patient been seen at the hospital within the last three years: Yes Total Score: 135 Level Of Care: New/Established - Level 4 Electronic Signature(s) Signed: 03/31/2019 6:42:58 PM By: [name] Gouty RN, BSN Entered By: [name] on 03/31/2019 11:19:31 -------------------------------------------------------------------------------- Encounter Discharge Information Details Patient Name: Date of Service: [name] 03/31/2019 9:45 AM Medical Record [id number] Patient Account Number: [id number] Date of Birth/Sex: Treating RN: [name] (58 y.o. [name] Primary Care [name]: [name] Other  Clinician: Referring [name]: Treating [name]/Extender:[name], [name], [name] in Treatment: 29 Encounter Discharge Information Items Discharge Condition: Stable Ambulatory Status: Wheelchair Discharge Destination: Home Transportation: Private Auto Accompanied By: caregiver Schedule Follow-up Appointment: Yes Clinical Summary of Care: Electronic Signature(s) Signed: 03/31/2019 6:30:51 PM By: [name] Entered By: [name] on 03/31/2019 15:04:35 -------------------------------------------------------------------------------- Lower Extremity Assessment Details Patient Name: Date of Service: [name], [name] 03/31/2019 9:45 AM Medical Record [id number] Patient Account Number: [id number] Date of Birth/Sex: Treating RN: [name] (58 y.o. [name] Primary Care [name]: [name] Other Clinician: Referring [name]: Treating [name]/Extender:[name], [name], [name] in Treatment: 89 Edema Assessment Assessed: [Left: No] [Right: No] Edema: [Left: No] [Right: No] Calf Left: Right: Point of Measurement: cm From Medial Instep 23.6 cm 24.8 cm Ankle Left: Right: Point of Measurement: cm From Medial Instep 16.8 cm 16.7 cm Vascular Assessment Pulses: Dorsalis Pedis Palpable: [Left:Yes] [Right:Yes] Electronic Signature(s) Signed: 04/05/2019 5:46:53 PM By: [name] Hurst RN, BSN Entered By: [name] on 03/31/2019 10:31:23 -------------------------------------------------------------------------------- [name] Details Patient Name: Date of Service: [name], [name] 03/31/2019 9:45 AM Medical Record [id number] Patient Account Number: [id number] Date of Birth/Sex: Treating RN: [birth year]-[name] (58 y.o. [name] Primary Care Reily Ilic: [name] Other Clinician: Referring Delance Weide: Treating [name]/Extender:[name], [name], [name] in Treatment: 41 Active  Inactive Pressure Nursing Diagnoses: Knowledge deficit related to causes and risk factors for pressure ulcer development Knowledge deficit related to management of pressures ulcers Potential for impaired tissue integrity related to pressure, friction, moisture, and shear Goals: Patient will remain free from development of additional pressure ulcers Date Initiated: 07/14/2017 Date Inactivated: 12/25/2017 Target Resolution Date: 12/19/2017 Unmet Reason: new Goal Status: Unmet pressure ulcers Patient/caregiver will verbalize risk factors for pressure ulcer development Date Initiated: 07/14/2017 Date  Inactivated: 03/02/2018 Target Resolution Date: 02/27/2018 Goal Status: Met Patient/caregiver will verbalize understanding of pressure ulcer management Date Initiated: 07/14/2017 Target Resolution Date: 04/29/2018 Goal Status: Active Interventions: Assess: immobility, friction, shearing, incontinence upon admission and as needed Assess offloading mechanisms upon admission and as needed Assess potential for pressure ulcer upon admission and as needed Provide education on pressure ulcers Notes: Wound/Skin Impairment Nursing Diagnoses: Impaired tissue integrity Knowledge deficit related to ulceration/compromised skin integrity Goals: Patient/caregiver will verbalize understanding of skin care regimen Date Initiated: 07/14/2017 Target Resolution Date: 04/28/2019 Goal Status: Active Ulcer/skin breakdown will have a volume reduction of 30% by week 4 Date Initiated: 07/14/2017 Date Inactivated: 08/18/2017 Target Resolution Date: 08/15/2017 Goal Status: Met Ulcer/skin breakdown will have a volume reduction of 50% by week 8 Date Initiated: 07/14/2017 Date Inactivated: 10/09/2017 Target Resolution Date: 09/19/2017 Goal Status: Unmet Unmet Reason: infection Ulcer/skin breakdown will heal within 14 weeks Date Initiated: 07/14/2017 Date Inactivated: 10/24/2017 Target Resolution Date: 10/24/2017 Unmet Reason:  complex Goal Status: Unmet comorbidities Interventions: Assess patient/caregiver ability to obtain necessary supplies Assess patient/caregiver ability to perform ulcer/skin care regimen upon admission and as needed Assess ulceration(s) every visit Provide education on ulcer and skin care Notes: Electronic Signature(s) Signed: 03/31/2019 6:42:58 PM By: [name] Gouty RN, BSN Entered By: [name] on 03/31/2019 11:18:07 -------------------------------------------------------------------------------- Pain Assessment Details Patient Name: Date of Service: [name], [name] 03/31/2019 9:45 AM Medical Record [id number] Patient Account Number: [id number] Date of Birth/Sex: Treating RN: [name] (58 y.o. [name] Primary Care Joshaua Epple: [name] Other Clinician: Referring Danyiel Crespin: Treating Dyamond Tolosa/Extender:[name], [name], [name] in Treatment: 18 Active Problems Location of Pain Severity and Description of Pain Patient Has Paino Yes Site Locations Rate the pain. Current Pain Level: 8 Character of Pain Describe the Pain: Throbbing Pain Management and Medication Current Pain Management: Medication: No Cold Application: No Rest: No Massage: No Activity: No T.E.N.S.: No Heat Application: No Leg drop or elevation: No Is the Current Pain Management Adequate: Adequate How does your wound impact your activities of daily livingo Sleep: No Bathing: No Appetite: No Relationship With Others: No Bladder Continence: No Emotions: No Bowel Continence: No Work: No Toileting: No Drive: No Dressing: No Hobbies: No Electronic Signature(s) Signed: 04/05/2019 5:46:53 PM By: [name] Hurst RN, BSN Entered By: [name] on 03/31/2019 10:31:18 -------------------------------------------------------------------------------- Patient/Caregiver Education Details Patient Name: [name] 11/4/2020andnbsp9:45 Date of Service: AM Medical  Record [id number] Number: Patient Account Number: [id number] Treating RN: Date of Birth/Gender: [birth year] (58 y.o. [name]) Other Clinician: Primary Care Physician: Sheria Lang Referring Physician: Physician/Extender: [name] in Treatment: 18 Education Assessment Education Provided To: Patient Education Topics Provided Pressure: Methods: Explain/Verbal Responses: Reinforcements needed, State content correctly Wound/Skin Impairment: Methods: Explain/Verbal Responses: Reinforcements needed, State content correctly Electronic Signature(s) Signed: 03/31/2019 6:42:58 PM By: [name] Gouty RN, BSN Entered By: [name] on 03/31/2019 11:18:30 -------------------------------------------------------------------------------- Wound Assessment Details Patient Name: Date of Service: [name], [name] 03/31/2019 9:45 AM Medical Record [id number] Patient Account Number: [id number] Date of Birth/Sex: Treating RN: [birth year] (58 y.o. [name] Primary Care Leea Rambeau: [name] Other Clinician: Referring Venera Privott: Treating Renesmay Nesbitt/Extender:[name], [name], [name] in Treatment: 85 Wound Status Wound Number: 32 Primary Skin Tear Etiology: Wound Location: Left Lower Leg - Medial Wound Healed - Epithelialized Wounding Event: Gradually Appeared Status: Date Acquired: 11/10/2018 Comorbid Deep Vein Thrombosis, Colitis, Weeks Of Treatment: 20 History: Osteomyelitis, Neuropathy, Quadriplegia, Clustered Wound: No Confinement Anxiety Photos Wound Measurements Length: (cm)  0 % Reduct Width: (cm) 0 % Reduct Depth: (cm) 0 Epitheli Area: (cm) 0 Tunneli Volume: (cm) 0 Undermi Wound Description Classification: Full Thickness Without Exposed Support Foul Odo Classification: Structures Sl Wound Flat and Intact Margin: Exudate None Present Amount: Wound Bed Granulation Amount: None Present  (0%) Necrotic Amount: None Present (0%) Fas Fat Ten Mus Joi Bon r After Cleansing: No ough/Fibrino No Exposed Structure cia Exposed: No Layer (Subcutaneous Tissue) Exposed: No don Exposed: No cle Exposed: No nt Exposed: No e Exposed: No ion in Area: 100% ion in Volume: 100% alization: Large (67-100%) ng: No ning: No Electronic Signature(s) Signed: 04/05/2019 4:06:15 PM By: Mikeal Hawthorne EMT/HBOT Signed: 04/05/2019 5:46:53 PM By: [name] Hurst RN, BSN Entered By: Mikeal Hawthorne on 04/02/2019 11:33:15 -------------------------------------------------------------------------------- Wound Assessment Details Patient Name: Date of Service: [name], [name] 03/31/2019 9:45 AM Medical Record [id number] Patient Account Number: [id number] Date of Birth/Sex: Treating RN: [birth year] [name] (58 y.o. [name] Primary Care Summer Mccolgan: [name] Other Clinician: Referring Janet Decesare: Treating Reyna Lorenzi/Extender:[name], [name], [name] in Treatment: 20 Wound Status Wound Number: 33 Primary Pressure Ulcer Etiology: Wound Location: Right Foot - Lateral Wound Healed - Epithelialized Wounding Event: Gradually Appeared Status: Date Acquired: 01/01/2019 Comorbid Deep Vein Thrombosis, Colitis, Weeks Of Treatment: 11 History: Osteomyelitis, Neuropathy, Quadriplegia, Clustered Wound: No Confinement Anxiety Photos Wound Measurements Length: (cm) Width: (cm) Depth: (cm) 0 Area: (cm) 0 Volume: (cm) 0 Wound Description Classification: Unstageable/Unclassified Wound Margin: Flat and Intact Exudate Amount: None Present Wound Bed Granulation Amount: None Present (0%) Necrotic Amount: None Present (0%) After Cleansing: No brino No Exposed Structure sed: No Subcutaneous Tissue) Exposed: No sed: No sed: No ed: No d: No 0 % Reduction in Area: 100% 0 % Reduction in Volume: 100% Epithelialization: Large (67-100%) Tunneling: No Undermining: No Foul  Odor Slough/Fi Fascia Expo Fat Layer ( Tendon Expo Muscle Expo Joint Expos Bone Expose Electronic Signature(s) Signed: 04/05/2019 4:06:15 PM By: Mikeal Hawthorne EMT/HBOT Signed: 04/05/2019 5:46:53 PM By: [name] Hurst RN, BSN Entered By: Mikeal Hawthorne on 04/02/2019 11:32:46 -------------------------------------------------------------------------------- Wound Assessment Details Patient Name: Date of Service: [name], [name] 03/31/2019 9:45 AM Medical Record [id number] Patient Account Number: [id number] Date of Birth/Sex: Treating RN: [birth year] [name] (58 y.o. [name] Primary Care Kazue Cerro: [name] Other Clinician: Referring Nickoli Bagheri: Treating Chanley Mcenery/Extender:[name], [name], [name] in Treatment: 29 Wound Status Wound Number: 34 Primary Pressure Ulcer Etiology: Wound Location: Sacrum Wound Open Wounding Event: Pressure Injury Status: Date Acquired: 02/25/2019 Comorbid Deep Vein Thrombosis, Colitis, Weeks Of Treatment: 0 History: Osteomyelitis, Neuropathy, Quadriplegia, Clustered Wound: No Confinement Anxiety Photos Wound Measurements Length: (cm) 0.2 % Redu Width: (cm) 0.2 % Redu Depth: (cm) 0.1 Epithe Area: (cm) 0.031 Tunne Volume: (cm) 0.003 Under Wound Description Classification: Category/Stage II Wound Margin: Flat and Intact Exudate Amount: Small Exudate Type: Serosanguineous Exudate Color: red, brown Wound Bed Granulation Amount: Large (67-100%) Granulation Quality: Pink Necrotic Amount: None Present (0%) Foul Odor After Cleansing: No Slough/Fibrino No Exposed Structure Fascia Exposed: No Fat Layer (Subcutaneous Tissue) Exposed: Yes Tendon Exposed: No Muscle Exposed: No Joint Exposed: No Bone Exposed: No ction in Area: 0% ction in Volume: 0% lialization: None ling: No mining: No Treatment Notes Wound #34 (Sacrum) 1. Cleanse With Wound Cleanser 2. Periwound Care Barrier cream Skin Prep 3. Primary  Dressing Applied Foam 5. Secured With Self Adhesive Bandage Notes lotion to both feet and legs. Electronic Signature(s) Signed: 04/05/2019 4:06:15 PM By: Mikeal Hawthorne EMT/HBOT Signed: 04/05/2019 5:46:53 PM By: [name] Hurst RN,  DAMIONE, ROBIDEAU ([id number]) Visit Report for 03/31/2019 Arrival Information Details Patient Name: Date of Service: [name], [name] 03/31/2019 9:45 AM Medical Record [id number] Patient Account Number: [id number] Date of Birth/Sex: Treating RN: [birth year] (58 y.o. [name] Primary Care Delmi Fulfer: [name] Other Clinician: Referring Keona Sheffler: Treating Kailen Name/Extender:[name], [name], [name] in Treatment: 2 Visit Information History Since Last Visit Added or deleted any medications: No Patient Arrived: Wheel Chair Any new allergies or adverse reactions: No Arrival Time: 10:07 Had a fall or experienced change in No Accompanied By: caregiver activities of daily living that may affect Transfer Assistance: Harrel Lemon Lift risk of falls: Patient Identification Verified: Yes Signs or symptoms of abuse/neglect since last No Secondary Verification Process Yes visito Completed: Hospitalized since last visit: No Patient Requires Transmission- No Implantable device outside of the clinic excluding No Based Precautions: cellular tissue based products placed in the center Patient Has Alerts: Yes R ABI 1.14, L ABI since last visit: Patient Alerts: Has Dressing in Place as Prescribed: Yes 1.16 Has Compression in Place as Prescribed: Yes Pain Present Now: Yes Electronic Signature(s) Signed: 04/05/2019 5:46:53 PM By: [name] Hurst RN, BSN Entered By: [name] on 03/31/2019 10:30:15 -------------------------------------------------------------------------------- Clinic Level of Care Assessment Details Patient Name: Date of Service: [name], [name] 03/31/2019 9:45 AM Medical Record [id number] Patient Account Number: [id number] Date of Birth/Sex: Treating RN: [name] (58 y.o. [name] Primary Care Luree Palla: [name] Other Clinician: Referring Lillah Standre: Treating Sundee Garland/Extender:[name], [name], [name]  in Treatment: 91 Clinic Level of Care Assessment Items TOOL 4 Quantity Score _0  - Use when only an EandM is performed on FOLLOW-UP visit 0 ASSESSMENTS - Nursing Assessment / Reassessment X - Reassessment of Co-morbidities (includes updates in patient status) 1 10 X - Reassessment of Adherence to Treatment Plan 1 5 ASSESSMENTS - Wound and Skin Assessment / Reassessment _1  - Simple Wound Assessment / Reassessment - one wound 0 X - Complex Wound Assessment / Reassessment - multiple wounds 3 5 _2  - Dermatologic / Skin Assessment (not related to wound area) 0 ASSESSMENTS - Focused Assessment _3  - Circumferential Edema Measurements - multi extremities 0 _4  - Nutritional Assessment / Counseling / Intervention 0 X - Lower Extremity Assessment (monofilament, tuning fork, pulses) 1 5 _5  - Peripheral Arterial Disease Assessment (using hand held doppler) 0 ASSESSMENTS - Ostomy and/or Continence Assessment and Care _6  - Incontinence Assessment and Management 0 _7  - Ostomy Care Assessment and Management (repouching, etc.) 0 PROCESS - Coordination of Care X - Simple Patient / Family Education for ongoing care 1 15 _8  - Complex (extensive) Patient / Family Education for ongoing care 0 X - Staff obtains Programmer, systems, Records, Test Results / Process Orders 1 10 X - Staff telephones HHA, Nursing Homes / Clarify orders / etc 1 10 _9  - Routine Transfer to another Facility (non-emergent condition) 0 _10  - Routine Hospital Admission (non-emergent condition) 0 _11  - New Admissions / Biomedical engineer / Ordering NPWT, Apligraf, etc. 0 _12  - Emergency Hospital Admission (emergent condition) 0 X - Simple Discharge Coordination 1 10 _13  - Complex (extensive) Discharge Coordination 0 PROCESS - Special Needs _14  - Pediatric / Minor Patient Management 0 _15  - Isolation Patient Management 0 _16  - Hearing / Language / Visual special needs 0 _17  - Assessment of Community assistance (transportation, D/C planning, etc.)  0 _18  - Additional assistance / Altered mentation 0 _19  - Support Surface(s) Assessment (bed, cushion, seat, etc.) 0 INTERVENTIONS - Wound Cleansing / Measurement _20  -

## 2019-04-06 ENCOUNTER — Emergency Department (HOSPITAL_COMMUNITY): Payer: Medicare Other

## 2019-04-06 ENCOUNTER — Emergency Department (HOSPITAL_COMMUNITY)
Admission: EM | Admit: 2019-04-06 | Discharge: 2019-04-06 | Disposition: A | Discharge status: Home / Self Care | Payer: Medicare Other | Source: Home / Self Care | Attending: Emergency Medicine | Admitting: Emergency Medicine

## 2019-04-06 ENCOUNTER — Other Ambulatory Visit: Payer: Self-pay

## 2019-04-06 ENCOUNTER — Encounter (HOSPITAL_COMMUNITY): Payer: Self-pay | Admitting: Emergency Medicine

## 2019-04-06 DIAGNOSIS — R197 Diarrhea, unspecified: Secondary | ICD-10-CM | POA: Insufficient documentation

## 2019-04-06 DIAGNOSIS — R1084 Generalized abdominal pain: Secondary | ICD-10-CM | POA: Insufficient documentation

## 2019-04-06 DIAGNOSIS — G809 Cerebral palsy, unspecified: Secondary | ICD-10-CM | POA: Insufficient documentation

## 2019-04-06 DIAGNOSIS — Z79899 Other long term (current) drug therapy: Secondary | ICD-10-CM | POA: Insufficient documentation

## 2019-04-06 LAB — CBC WITH DIFFERENTIAL/PLATELET
Abs Immature Granulocytes: 0.04 10*3/uL (ref 0.00–0.07)
Basophils Absolute: 0.1 10*3/uL (ref 0.0–0.1)
Basophils Relative: 1 %
Eosinophils Absolute: 0.1 10*3/uL (ref 0.0–0.5)
Eosinophils Relative: 1 %
HCT: 41.6 % (ref 39.0–52.0)
Hemoglobin: 12.8 g/dL — ABNORMAL LOW (ref 13.0–17.0)
Immature Granulocytes: 0 %
Lymphocytes Relative: 17 %
Lymphs Abs: 1.7 10*3/uL (ref 0.7–4.0)
MCH: 23.8 pg — ABNORMAL LOW (ref 26.0–34.0)
MCHC: 30.8 g/dL (ref 30.0–36.0)
MCV: 77.5 fL — ABNORMAL LOW (ref 80.0–100.0)
Monocytes Absolute: 1.6 10*3/uL — ABNORMAL HIGH (ref 0.1–1.0)
Monocytes Relative: 16 %
Neutro Abs: 6.5 10*3/uL (ref 1.7–7.7)
Neutrophils Relative %: 65 %
Platelets: 370 10*3/uL (ref 150–400)
RBC: 5.37 MIL/uL (ref 4.22–5.81)
RDW: 20.4 % — ABNORMAL HIGH (ref 11.5–15.5)
WBC: 10 10*3/uL (ref 4.0–10.5)
nRBC: 0 % (ref 0.0–0.2)

## 2019-04-06 LAB — COMPREHENSIVE METABOLIC PANEL
ALT: 14 U/L (ref 0–44)
AST: 13 U/L — ABNORMAL LOW (ref 15–41)
Albumin: 3.6 g/dL (ref 3.5–5.0)
Alkaline Phosphatase: 154 U/L — ABNORMAL HIGH (ref 38–126)
Anion gap: 10 (ref 5–15)
BUN: 18 mg/dL (ref 6–20)
CO2: 24 mmol/L (ref 22–32)
Calcium: 9.7 mg/dL (ref 8.9–10.3)
Chloride: 104 mmol/L (ref 98–111)
Creatinine, Ser: 0.58 mg/dL — ABNORMAL LOW (ref 0.61–1.24)
GFR calc Af Amer: >60 mL/min (ref >60)
GFR calc non Af Amer: >60 mL/min (ref >60)
Glucose, Bld: 106 mg/dL — ABNORMAL HIGH (ref 70–99)
Potassium: 3.2 mmol/L — ABNORMAL LOW (ref 3.5–5.1)
Sodium: 138 mmol/L (ref 135–145)
Total Bilirubin: 0.7 mg/dL (ref 0.3–1.2)
Total Protein: 7.8 g/dL (ref 6.5–8.1)

## 2019-04-06 LAB — URINALYSIS, ROUTINE W REFLEX MICROSCOPIC
Bilirubin Urine: NEGATIVE
Glucose, UA: NEGATIVE mg/dL
Hgb urine dipstick: NEGATIVE
Ketones, ur: 20 mg/dL — AB
Leukocytes,Ua: NEGATIVE
Nitrite: NEGATIVE
Protein, ur: NEGATIVE mg/dL
Specific Gravity, Urine: 1.044 — ABNORMAL HIGH (ref 1.005–1.030)
pH: 5 (ref 5.0–8.0)

## 2019-04-06 LAB — C DIFFICILE QUICK SCREEN W PCR REFLEX
C Diff antigen: NEGATIVE
C Diff interpretation: NOT DETECTED
C Diff toxin: NEGATIVE

## 2019-04-06 LAB — LIPASE, BLOOD: Lipase: 17 U/L (ref 11–51)

## 2019-04-06 LAB — LACTIC ACID, PLASMA
Lactic Acid, Venous: 2.4 mmol/L (ref 0.5–1.9)
Lactic Acid, Venous: 1.6 mmol/L (ref 0.5–1.9)

## 2019-04-06 LAB — VALPROIC ACID LEVEL: Valproic Acid Lvl: 51 ug/mL (ref 50.0–100.0)

## 2019-04-06 MED ORDER — MORPHINE SULFATE (PF) 4 MG/ML IV SOLN
4.0000 mg | Freq: Once | INTRAVENOUS | Status: AC
  Administered 2019-04-06: 4 mg via INTRAVENOUS
  Filled 2019-04-06: qty 1

## 2019-04-06 MED ORDER — SODIUM CHLORIDE 0.9 % IV BOLUS
1000.0000 mL | Freq: Once | INTRAVENOUS | Status: AC
  Administered 2019-04-06: 1000 mL via INTRAVENOUS

## 2019-04-06 MED ORDER — LOPERAMIDE HCL 2 MG PO CAPS: 2.0000 mg | ORAL_CAPSULE | Freq: Four times a day (QID) | ORAL | 0 refills | Status: DC | PRN

## 2019-04-06 MED ORDER — POTASSIUM CHLORIDE 10 MEQ/100ML IV SOLN
10.0000 meq | Freq: Once | INTRAVENOUS | Status: AC
  Administered 2019-04-06: 10 meq via INTRAVENOUS
  Filled 2019-04-06: qty 100

## 2019-04-06 MED ORDER — ONDANSETRON HCL 4 MG/2ML IJ SOLN
4.0000 mg | Freq: Once | INTRAMUSCULAR | Status: AC
  Administered 2019-04-06: 4 mg via INTRAVENOUS
  Filled 2019-04-06: qty 2

## 2019-04-06 MED ORDER — IOHEXOL 300 MG/ML  SOLN
100.0000 mL | Freq: Once | INTRAMUSCULAR | Status: AC | PRN
  Administered 2019-04-06: 100 mL via INTRAVENOUS

## 2019-04-06 NOTE — ED Triage Notes (Signed)
Pt presents from group home with diarrhea x 4 days. Group home tried imodium w/o relief. Pt is eating and drinking but scared to eat bc of diarrhea.

## 2019-04-06 NOTE — ED Provider Notes (Signed)
Care assumed from  [facility], PA-C at shift change with UA and PO challenge pending.   In brief, this patient is a 58 y.o. M resents for evaluation of 5 days of nonbloody diarrhea.  He denies any sick contacts.  Ports some mild right-sided abdominal pain but no vomiting.  No fevers.  Please see note from previous provider for full history/physical exam.  Physical Exam  BP 124/81   Pulse 80   Temp (!) 97.5 F (36.4 C) (Oral)   Resp 17   SpO2 98%   Physical Exam  ED Course/Procedures     Procedures  MDM  PLAN: Patient pending urinalysis and p.o. challenge.  If unremarkable, plan to discharge home.  MDM: UA negative for any infectious etiology.  Patient able to tolerate p.o. without any difficulty.  He is hemodynamically stable for discharge.  Discussed results with patient. Will give short course of imodium to help with symptoms. At this time, patient exhibits no emergent life-threatening condition that require further evaluation in ED or admission. Patient had ample opportunity for questions and discussion. All patient's questions were answered with full understanding.  Portions of this note were generated with [employer]. Dictation errors may occur despite best attempts at proofreading.   1. Diarrhea, unspecified type   2. Generalized abdominal pain        Desma Mcgregor 04/06/19 [id number]    Dorie Rank, MD 04/07/19 1649

## 2019-04-06 NOTE — ED Provider Notes (Signed)
[facility] DEPT Provider Note   CSN: [id number] Arrival date & time: 04/06/19  1033    History   Chief Complaint Chief Complaint  Patient presents with  . Diarrhea    HPI [name] is a 58 y.o. male with history significant for bipolar disorder, chronic constipation, cerebral palsy, GERD, PUD who presents for evaluation of diarrhea.  Patient has had diarrhea over the last 4 days.  No melena or hematochezia.  He presents with a who assists with some of the history.  No recent travel or antibiotic use.  They have tried Imodium at the facility which has not helped.  Patient with 6-7 loose stools daily.  He has been tolerating normal p.o. intake however per facility "just goes right through him."  He also notes right-sided abdominal pain.  He is contracted on his right upper extremity and bilateral lower extremities at baseline.  He is currently being treated for a pressure ulcer to the sacral region.  Per facility this has "significantly improved."  They have been using zinc cream.  Normal urination.  Denies fever, chills, nausea, vomiting, chest pain, shortness of breath, vesicles.  Denies additional rating or alleviating factors.  History obtained from patient, aid in room, past medical records.  No interpreter is used.     HPI  Past Medical History:  Diagnosis Date  . Anxiety    takes Ativan daily as needed  . Bipolar disorder ([place])    takes Depakote daily  . CEREBRAL PALSY 07/24/2006  . Chronic bronchitis ([place])    "gets it q yr"  . Constipation    takes Miralax daily as needed  . Depression    takes Risperdal nightly  . Environmental allergies    takes Zyrtec daily  . GASTRIC ULCER ACUTE WITH HEMORRHAGE 07/24/2006  . GERD (gastroesophageal reflux disease)    takes Pravacid daily  . History of blood transfusion    no abnormal reaction noted  . History of hiatal hernia   . History of shingles   . History of stomach ulcers ~ 1996  .  Hypercholesterolemia   . Pneumonia "quite a few times"   in 2015 and in June 2016  . Recurrent UTI (urinary tract infection)   . URINARY INCONTINENCE 02/09/2010   takes Detrol daily-occasionally incontinence  . Weakness    numbness in extremities    Patient Active Problem List   Diagnosis Date Noted  . Pressure injury of skin 08/10/2018  . Gastric wall thickening 08/10/2018  . Alkaline phosphatase elevation 08/10/2018  . Cholelithiasis 08/10/2018  . Colon cancer screening   . Microcytic anemia   . Screening for lipid disorders 06/04/2018  . Hiatal hernia 04/07/2018  . Cerumen impaction 02/04/2017  . 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 Personal history of gastric ulcer 01/20/2016  . Palliative care encounter   . Quadriplegia ([place]) 12/15/2015  . Cerebral palsy, quadriplegic ([place])   . Pre-diabetes 08/31/2015  . Foot ulcer, limited to breakdown of skin ([place]) 06/08/2015  . Constipation 11/01/2014  . Seasonal allergies 11/01/2014  . Pressure ulcer of foot 03/14/2014  . 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 RENAL GLYCOSURIA 02/26/2010  . URINARY INCONTINENCE 02/09/2010  . Person living in residential institution 07/05/2009  . Mental retardation 07/24/2006  . Infantile cerebral palsy ([place]) 07/24/2006    Past Surgical History:  Procedure Laterality Date  . AMPUTATION Right 12/14/2014   Procedure: AMPUTATION DIGIT RIGHT GREAT TOE MTP;  Surgeon: Beverely Low  Fernanda Drum, MD;  Location: [facility];  Service: Orthopedics;  Laterality: Right;  . BIOPSY  08/12/2018   Procedure: BIOPSY;  Surgeon: Jackquline Denmark, MD;  Location: [facility];  Service: Gastroenterology;;  . COLONOSCOPY WITH PROPOFOL N/A 08/14/2018   Procedure: COLONOSCOPY WITH PROPOFOL;  Surgeon: Jackquline Denmark, MD;  Location: [facility];  Service: Gastroenterology;  Laterality: N/A;  . ESOPHAGOGASTRODUODENOSCOPY (EGD) WITH PROPOFOL N/A 08/12/2018   Procedure: ESOPHAGOGASTRODUODENOSCOPY (EGD) WITH PROPOFOL;   Surgeon: Jackquline Denmark, MD;  Location: [facility];  Service: Gastroenterology;  Laterality: N/A;  . head surgery  as a baby   due to "water head"   . HERNIA REPAIR    . HIATAL HERNIA REPAIR  1982  . LEG SURGERY Bilateral ~ 1967   "legs were scissored; stretched them out"        Home Medications    Prior to Admission medications   Medication Sig Start Date End Date Taking? Authorizing Provider  acetaminophen (TYLENOL) 500 MG tablet Take 1,000 mg by mouth every 6 (six) hours as needed for moderate pain.    [provider]  alum & mag hydroxide-simeth (MYLANTA) 200-200-20 MG/5ML suspension Take 30 mLs by mouth every 6 (six) hours as needed for indigestion or heartburn. Use as directed as needed for nausea/indigestion per standing order    [provider]  baclofen (LIORESAL) 20 MG tablet Take 1 tablet (20 mg total) by mouth 3 (three) times daily. 07/18/16   Kirsteins, Luanna Salk, MD  bisacodyl (DULCOLAX) 5 MG EC tablet Take 2 tablets (10 mg total) by mouth daily as needed for moderate constipation. 08/15/18   Eugenie Filler, MD  brompheniramine-pseudoephedrine (DIMETAPP) 1-15 MG/5ML ELIX Take 10 mLs by mouth 2 (two) times daily as needed (cold symptoms). Use as directed as needed for cold symptoms per standing orders    [provider]  cetirizine (ZYRTEC) 10 MG tablet Take 1 tablet (10 mg total) by mouth daily. Patient taking differently: Take 10 mg by mouth at bedtime.  11/01/14   Street, Sharon Mt, MD  Cholecalciferol (VITAMIN D3) 5000 units CAPS Take 5,000 Units by mouth daily.    [provider]  diclofenac sodium (VOLTAREN) 1 % GEL Apply 4 g topically 4 (four) times daily.    [provider]  divalproex (DEPAKOTE SPRINKLE) 125 MG capsule Take 500 mg by mouth 2 (two) times daily.     [provider]  divalproex (DEPAKOTE SPRINKLE) 125 MG capsule  09/20/18   [provider]  docusate sodium (COLACE) 100 MG capsule Take 1  capsule (100 mg total) by mouth 2 (two) times daily. 04/07/18   Mullis, Kiersten P, DO  EAR DROPS EARWAX AID 6.5 % OTIC solution PLACE 5 DROPS INTO THE LEFT EAR TWICE DAILY Patient taking differently: Place 5 drops into the left ear 2 (two) times daily.  04/21/18   Mullis, Kiersten P, DO  Elastic Bandages & Supports (JOBST ANTI-EM KNEE HIGH MED) MISC by Does not apply route. Apply once in the morning and remove at bedtime daily    [provider]  Foot Care Products (PREVALON HEEL PROTECTOR) MISC 1 Device by Does not apply route daily. 06/20/15   Verner Mould, MD  guaifenesin (ROBITUSSIN) 100 MG/5ML syrup Take 200 mg by mouth as needed for cough.    [provider]  hydrocortisone cream 1 % APPLY TOPICALLY TO FACE AT BEDTIME Patient taking differently: Apply 1 application topically at bedtime.  08/15/17   Hennie Duos, MD  ketoconazole (NIZORAL) 2 % shampoo APPLY TOPICALLY TO AFFECTED AREA(S) TWICE WEEKLY AS DIRECTED Patient taking differently: Apply 1 application topically 2 (two) times a week.  10/22/17   Carlyle Dolly, MD  loperamide (IMODIUM) 2 MG capsule Take 2 mg by mouth as needed for diarrhea or loose stools.    [provider]  LORazepam (ATIVAN) 2 MG tablet Take 1 mg by mouth every 4 (four) hours as needed for anxiety.     [provider]  magnesium hydroxide (MILK OF MAGNESIA) 400 MG/5ML suspension Take 30 mLs by mouth daily as needed for moderate constipation. Take as directed as needed for constipation per standing order    [provider]  Neomycin-Bacitracin-Polymyxin (TRIPLE ANTIBIOTIC EX) Apply 1 application topically as needed (cuts/scrapes/scratches).    [provider]  omeprazole (PRILOSEC) 40 MG capsule Take 1 capsule (40 mg total) by mouth 2 (two) times daily. 08/15/18   Eugenie Filler, MD  Oyster Shell (OYSTER CALCIUM) 500 MG TABS tablet Take 1,500 mg of elemental calcium by mouth daily.    [provider]  polyethylene glycol (MIRALAX) packet Take 17 g by mouth 2 (two) times daily. 08/15/18   Eugenie Filler, MD  risperiDONE (RISPERDAL) 2 MG tablet Take 2 mg by mouth at bedtime.    [provider]  tamsulosin (FLOMAX) 0.4 MG CAPS capsule TAKE 1 CAPSULE BY MOUTH EVERY DAY *DO NOT CRUSH* 10/01/18   Mullis, Kiersten P, DO  tiZANidine (ZANAFLEX) 2 MG tablet Take 2 mg by mouth 3 (three) times daily.  07/22/18   [provider]  tolterodine (DETROL LA) 2 MG 24 hr capsule Take 1 capsule (2 mg total) by mouth daily. Patient taking differently: Take 2 mg by mouth daily at 6 PM.  04/08/13   Street, Sharon Mt, MD  tretinoin (RETIN-A) 0.025 % gel Apply 1 application topically at bedtime.    [provider]  triamcinolone cream (KENALOG) 0.1 % Apply 1 application topically daily.     [provider]  vitamin C (VITAMIN C) 250 MG tablet Take 1 tablet (250 mg total) by mouth daily. 01/18/16   Smiley Houseman, MD  Zinc Oxide 40 % PSTE APPLY A THIN LAYER TOPICALLY TO BUTTOCKS AND SCROTUM TWICE DAILY *BEFIN AFTER COMPLETION OF CLOTRIMAZOLE OINTMENT* Patient taking differently: Apply 1 application topically 2 (two) times daily. To scrotum and buttocks *Begin after completion of clotrimazole ointment* 09/18/17   Steve Rattler, DO    Family History Family History  Adopted: Yes    Social History Social History   Tobacco Use  . Smoking status: Never Smoker  . Smokeless tobacco: Never Used  Substance Use Topics  . Alcohol use: No  . Drug use: No     Allergies   Adhesive [tape]   Review of Systems Review of Systems  Constitutional: Negative.   HENT: Negative.   Respiratory: Negative.   Cardiovascular: Negative.   Gastrointestinal: Positive for abdominal pain and diarrhea. Negative for abdominal distention, anal bleeding, blood in stool, constipation, nausea, rectal pain and vomiting.  Genitourinary: Negative.   Musculoskeletal: Negative.    Skin: Negative.   Neurological: Negative.   All other systems reviewed and are negative.  Physical Exam Updated Vital Signs BP 124/66   Pulse 85   Temp (!) 97.5 F (36.4 C) (Oral)   Resp 17   SpO2 97%   Physical Exam Vitals signs and nursing note reviewed.  Constitutional:      General: He is  not in acute distress.    Appearance: He is well-developed. He is not toxic-appearing or diaphoretic. Ill appearance: Chronicall ill appearing.  HENT:     Head: Normocephalic and atraumatic.     Jaw: There is normal jaw occlusion.     Mouth/Throat:     Lips: Pink.     Mouth: Mucous membranes are dry.     Pharynx: Uvula midline.     Comments: Mucous membranes dry Eyes:     Pupils: Pupils are equal, round, and reactive to light.  Neck:     Musculoskeletal: Full passive range of motion without pain, normal range of motion and neck supple.     Trachea: Phonation normal.     Comments: No neck stiffness or neck rigidity. Cardiovascular:     Rate and Rhythm: Normal rate and regular rhythm.     Pulses: Normal pulses.     Heart sounds: Normal heart sounds.  Pulmonary:     Effort: Pulmonary effort is normal. No respiratory distress.     Breath sounds: Normal breath sounds and air entry.  Chest:     Comments: Stage I pressure ulcer to right lateral chest wall.  No surrounding induration, fluctuance, crepitus. Abdominal:     General: Bowel sounds are normal. There is no distension.     Palpations: Abdomen is soft.     Tenderness: There is generalized abdominal tenderness. Negative signs include Murphy's sign and McBurney's sign.     Hernia: No hernia is present.     Comments: Generalized tenderness to palpation.  Negative Murphy sign.  Old midline abdominal surgical scar.  Musculoskeletal: Normal range of motion.     Comments: Contracted right upper extremity as well as bilateral lower extremities.  Stage 1 pressure ulcer to sacral region.  Skin:    General: Skin is warm and dry.      Capillary Refill: Capillary refill takes less than 2 seconds.  Neurological:     Mental Status: He is alert.     Comments: No facial droop.  Speaks without difficulty.  Nonambulatory at baseline        ED Treatments / Results  Labs (all labs ordered are listed, but only abnormal results are displayed) Labs Reviewed  CBC WITH DIFFERENTIAL/PLATELET - Abnormal; Notable for the following components:      Result Value   Hemoglobin 12.8 (*)    MCV 77.5 (*)    MCH 23.8 (*)    RDW 20.4 (*)    Monocytes Absolute 1.6 (*)    All other components within normal limits  COMPREHENSIVE METABOLIC PANEL - Abnormal; Notable for the following components:   Potassium 3.2 (*)    Glucose, Bld 106 (*)    Creatinine, Ser 0.58 (*)    AST 13 (*)    Alkaline Phosphatase 154 (*)    All other components within normal limits  LACTIC ACID, PLASMA - Abnormal; Notable for the following components:   Lactic Acid, Venous 2.4 (*)    All other components within normal limits  C DIFFICILE QUICK SCREEN W PCR REFLEX  URINE CULTURE  GASTROINTESTINAL PANEL BY PCR, STOOL (REPLACES STOOL CULTURE)  CULTURE, BLOOD (ROUTINE X 2)  CULTURE, BLOOD (ROUTINE X 2) W REFLEX TO ID PANEL  LIPASE, BLOOD  LACTIC ACID, PLASMA  VALPROIC ACID LEVEL  URINALYSIS, ROUTINE W REFLEX MICROSCOPIC    EKG None  Radiology Ct Abdomen Pelvis W Contrast  Result Date: 04/06/2019 CLINICAL DATA:  Right-sided abdominal pain EXAM: CT ABDOMEN AND PELVIS WITH  CONTRAST TECHNIQUE: Multidetector CT imaging of the abdomen and pelvis was performed using the standard protocol following bolus administration of intravenous contrast. CONTRAST:  133m OMNIPAQUE IOHEXOL 300 MG/ML  SOLN COMPARISON:  03/03/2018 FINDINGS: Lower chest: Right base collapse/consolidation with small right pleural effusion. Large hiatal hernia noted. Hepatobiliary: No suspicious focal abnormality within the liver parenchyma. Tiny gallstone evident. No gallbladder wall thickening  or pericholecystic fluid. Trace periportal edema evident. No biliary dilatation. Pancreas: Diffuse atrophy of pancreatic parenchyma with prominence of the main pancreatic duct, similar to prior. Spleen: No splenomegaly. No focal mass lesion. Adrenals/Urinary Tract: No adrenal nodule or mass. Kidneys unremarkable. No evidence for hydroureter. Mild circumferential bladder wall thickening evident. Stomach/Bowel: Large hiatal hernia. Gastric wall thickening noted in the herniated segment of the stomach. Duodenum does not cross the midline suggesting intestinal malrotation. No small bowel dilatation to suggest obstruction. Right colon is fluid-filled and moderately distended without substantial wall thickening. Vascular/Lymphatic: No abdominal aortic aneurysm. Portal vein, superior mesenteric vein, and splenic vein are patent. There is no gastrohepatic or hepatoduodenal ligament lymphadenopathy. No intraperitoneal or retroperitoneal lymphadenopathy. No pelvic sidewall lymphadenopathy. Reproductive: The prostate gland and seminal vesicles are unremarkable. Other: No intraperitoneal free fluid. Musculoskeletal: No worrisome lytic or sclerotic osseous abnormality. IMPRESSION: 1. No acute findings in the abdomen or pelvis. No findings to explain the patient's history of right abdominal pain. 2. Similar appearance of large hiatal hernia with associated gastric wall thickening. Gastritis not excluded. 3. Mild circumferential bladder wall thickening, likely accentuated by underdistention. UTI not excluded. 4. Cholelithiasis 5. Right base collapse/consolidation with small right pleural effusion. Electronically Signed   By: EMisty StanleyM.D.   On: 04/06/2019 14:56    Procedures Procedures (including critical care time)  Medications Ordered in ED Medications  sodium chloride 0.9 % bolus 1,000 mL (0 mLs Intravenous Stopped 04/06/19 1243)  morphine 4 MG/ML injection 4 mg (4 mg Intravenous Given 04/06/19 1140)  ondansetron  (ZOFRAN) injection 4 mg (4 mg Intravenous Given 04/06/19 1140)  sodium chloride 0.9 % bolus 1,000 mL (1,000 mLs Intravenous Bolus from Bag 04/06/19 1251)  potassium chloride 10 mEq in 100 mL IVPB (10 mEq Intravenous New Bag/Given 04/06/19 1252)  iohexol (OMNIPAQUE) 300 MG/ML solution 100 mL (100 mLs Intravenous Contrast Given 04/06/19 1423)   Initial Impression / Assessment and Plan / ED Course  I have reviewed the triage vital signs and the nursing notes.  Pertinent labs & imaging results that were available during my care of the patient were reviewed by me and considered in my medical decision making (see chart for details).  58year old appear otherwise well presents for evaluation of diarrhea x 5 days. Diarrhea without melena or BRBPR.  App 6-7 episodes daily. No recent abx or travel. Generalized abd tenderness.  Nonseptic appearing however does appear chronically ill.  He has multiple contractures.  Stage I pressure ulcer to sacral region as well as right upper chest and right elbow. Followed by wound care for pressure ulcers. They do not appear infected. Tolerating PO intake at home. Mild decrease urine at living facility. Plan for labs, CT, GI pathogen and Cdiff.  Labs personally reviewed: CBC without leukocytosis, hgb 12.8 improved from previous Lipase 17 CMP with hypokalemia at 3.2, will replace, elevated alk pho however improvement from baseline. Negative Murphy sign. Low suspicion for acute gallbladder for CBD pathology Lactic acid 2.4, suspect related to dehydration from diarrhea. --Repeat 1.6 Urinalysis pending CT AP with large hiatal hernia. Bladder wall thickening. C Diff negative GI  pathogen pending  Care transferred to [facility] who will follow up on urinalysis. If negative plan to PO challenge. May dc home with antidiarrheal meds with close outpatient GI follow up.       Final Clinical Impressions(s) / ED Diagnoses   Final diagnoses:  Diarrhea, unspecified type   Generalized abdominal pain    ED Discharge Orders    None       Trayton Szabo A, PA-C 04/06/19 1503    Lennice Sites, DO 04/06/19 1507

## 2019-04-06 NOTE — ED Notes (Signed)
Pt has hx of cerebral palsy. Pt c/o swelling on R side of body.

## 2019-04-06 NOTE — ED Notes (Signed)
An After Visit Summary was printed and given to the patient. Discharge instructions given and no further questions at this time. Pt leaving with care giver/group home employee.

## 2019-04-06 NOTE — ED Notes (Signed)
Crackers and ice water given to patient. Pt denies nausea at this time.

## 2019-04-06 NOTE — Discharge Instructions (Addendum)
Take loperamide for diarrhea.  Follow-up with your primary care doctor if symptoms not improved in the next 5 to 7 days.  You have a GI stool culture pending.  If there is anything abnormalities, you will be notified.  Return the emergency room for any abdominal pain, fevers, worsening diarrhea, blood in stools or any other worsening concerning symptoms.

## 2019-04-06 NOTE — ED Notes (Signed)
Britni Henderly, PA aware patients lactic acid 2.4

## 2019-04-07 LAB — GASTROINTESTINAL PANEL BY PCR, STOOL (REPLACES STOOL CULTURE)

## 2019-04-07 LAB — URINE CULTURE: Culture: NO GROWTH

## 2019-04-08 ENCOUNTER — Inpatient Hospital Stay (HOSPITAL_COMMUNITY)
Admission: EM | Admit: 2019-04-08 | Discharge: 2019-04-12 | DRG: 917 | Disposition: A | Discharge status: Home / Self Care | Payer: Medicare Other | Attending: Family Medicine | Admitting: Family Medicine

## 2019-04-08 ENCOUNTER — Emergency Department (HOSPITAL_COMMUNITY): Payer: Medicare Other

## 2019-04-08 ENCOUNTER — Encounter (HOSPITAL_COMMUNITY): Payer: Self-pay | Admitting: Emergency Medicine

## 2019-04-08 DIAGNOSIS — N319 Neuromuscular dysfunction of bladder, unspecified: Secondary | ICD-10-CM | POA: Diagnosis present

## 2019-04-08 DIAGNOSIS — G808 Other cerebral palsy: Secondary | ICD-10-CM | POA: Diagnosis present

## 2019-04-08 DIAGNOSIS — F319 Bipolar disorder, unspecified: Secondary | ICD-10-CM | POA: Diagnosis present

## 2019-04-08 DIAGNOSIS — R109 Unspecified abdominal pain: Secondary | ICD-10-CM

## 2019-04-08 DIAGNOSIS — K219 Gastro-esophageal reflux disease without esophagitis: Secondary | ICD-10-CM | POA: Diagnosis present

## 2019-04-08 DIAGNOSIS — N179 Acute kidney failure, unspecified: Secondary | ICD-10-CM | POA: Diagnosis present

## 2019-04-08 DIAGNOSIS — J9811 Atelectasis: Secondary | ICD-10-CM | POA: Diagnosis present

## 2019-04-08 DIAGNOSIS — K2950 Unspecified chronic gastritis without bleeding: Secondary | ICD-10-CM | POA: Diagnosis present

## 2019-04-08 DIAGNOSIS — F79 Unspecified intellectual disabilities: Secondary | ICD-10-CM | POA: Diagnosis present

## 2019-04-08 DIAGNOSIS — N170 Acute kidney failure with tubular necrosis: Secondary | ICD-10-CM | POA: Diagnosis not present

## 2019-04-08 DIAGNOSIS — Z8619 Personal history of other infectious and parasitic diseases: Secondary | ICD-10-CM

## 2019-04-08 DIAGNOSIS — R748 Abnormal levels of other serum enzymes: Secondary | ICD-10-CM | POA: Diagnosis present

## 2019-04-08 DIAGNOSIS — E872 Acidosis, unspecified: Secondary | ICD-10-CM

## 2019-04-08 DIAGNOSIS — Z993 Dependence on wheelchair: Secondary | ICD-10-CM | POA: Diagnosis not present

## 2019-04-08 DIAGNOSIS — Z79899 Other long term (current) drug therapy: Secondary | ICD-10-CM

## 2019-04-08 DIAGNOSIS — K8020 Calculus of gallbladder without cholecystitis without obstruction: Secondary | ICD-10-CM | POA: Diagnosis present

## 2019-04-08 DIAGNOSIS — R339 Retention of urine, unspecified: Secondary | ICD-10-CM

## 2019-04-08 DIAGNOSIS — E860 Dehydration: Secondary | ICD-10-CM | POA: Diagnosis present

## 2019-04-08 DIAGNOSIS — I82512 Chronic embolism and thrombosis of left femoral vein: Secondary | ICD-10-CM | POA: Diagnosis present

## 2019-04-08 DIAGNOSIS — D72829 Elevated white blood cell count, unspecified: Secondary | ICD-10-CM | POA: Diagnosis present

## 2019-04-08 DIAGNOSIS — Z20828 Contact with and (suspected) exposure to other viral communicable diseases: Secondary | ICD-10-CM | POA: Diagnosis present

## 2019-04-08 DIAGNOSIS — E876 Hypokalemia: Secondary | ICD-10-CM | POA: Diagnosis present

## 2019-04-08 DIAGNOSIS — K449 Diaphragmatic hernia without obstruction or gangrene: Secondary | ICD-10-CM | POA: Diagnosis present

## 2019-04-08 DIAGNOSIS — K521 Toxic gastroenteritis and colitis: Secondary | ICD-10-CM | POA: Diagnosis present

## 2019-04-08 DIAGNOSIS — R197 Diarrhea, unspecified: Secondary | ICD-10-CM | POA: Diagnosis present

## 2019-04-08 DIAGNOSIS — F419 Anxiety disorder, unspecified: Secondary | ICD-10-CM | POA: Diagnosis present

## 2019-04-08 DIAGNOSIS — E785 Hyperlipidemia, unspecified: Secondary | ICD-10-CM | POA: Diagnosis present

## 2019-04-08 DIAGNOSIS — L89151 Pressure ulcer of sacral region, stage 1: Secondary | ICD-10-CM | POA: Diagnosis present

## 2019-04-08 DIAGNOSIS — R9431 Abnormal electrocardiogram [ECG] [EKG]: Secondary | ICD-10-CM | POA: Diagnosis present

## 2019-04-08 DIAGNOSIS — T474X1A Poisoning by other laxatives, accidental (unintentional), initial encounter: Secondary | ICD-10-CM | POA: Diagnosis present

## 2019-04-08 DIAGNOSIS — J42 Unspecified chronic bronchitis: Secondary | ICD-10-CM | POA: Diagnosis present

## 2019-04-08 DIAGNOSIS — Z8711 Personal history of peptic ulcer disease: Secondary | ICD-10-CM

## 2019-04-08 DIAGNOSIS — Z8701 Personal history of pneumonia (recurrent): Secondary | ICD-10-CM

## 2019-04-08 DIAGNOSIS — J90 Pleural effusion, not elsewhere classified: Secondary | ICD-10-CM | POA: Diagnosis present

## 2019-04-08 DIAGNOSIS — K5909 Other constipation: Secondary | ICD-10-CM | POA: Diagnosis present

## 2019-04-08 DIAGNOSIS — L89223 Pressure ulcer of left hip, stage 3: Secondary | ICD-10-CM

## 2019-04-08 DIAGNOSIS — L8990 Pressure ulcer of unspecified site, unspecified stage: Secondary | ICD-10-CM

## 2019-04-08 DIAGNOSIS — Z87440 Personal history of urinary (tract) infections: Secondary | ICD-10-CM

## 2019-04-08 DIAGNOSIS — M19019 Primary osteoarthritis, unspecified shoulder: Secondary | ICD-10-CM | POA: Diagnosis present

## 2019-04-08 DIAGNOSIS — Z9109 Other allergy status, other than to drugs and biological substances: Secondary | ICD-10-CM

## 2019-04-08 LAB — CBC WITH DIFFERENTIAL/PLATELET
Abs Immature Granulocytes: 0.21 10*3/uL — ABNORMAL HIGH (ref 0.00–0.07)
Basophils Absolute: 0 10*3/uL (ref 0.0–0.1)
Basophils Relative: 0 %
Eosinophils Absolute: 0 10*3/uL (ref 0.0–0.5)
Eosinophils Relative: 0 %
HCT: 45.6 % (ref 39.0–52.0)
Hemoglobin: 14.3 g/dL (ref 13.0–17.0)
Immature Granulocytes: 2 %
Lymphocytes Relative: 14 %
Lymphs Abs: 1.6 10*3/uL (ref 0.7–4.0)
MCH: 24.1 pg — ABNORMAL LOW (ref 26.0–34.0)
MCHC: 31.4 g/dL (ref 30.0–36.0)
MCV: 76.9 fL — ABNORMAL LOW (ref 80.0–100.0)
Monocytes Absolute: 2.2 10*3/uL — ABNORMAL HIGH (ref 0.1–1.0)
Monocytes Relative: 19 %
Neutro Abs: 7.6 10*3/uL (ref 1.7–7.7)
Neutrophils Relative %: 65 %
Platelets: 438 10*3/uL — ABNORMAL HIGH (ref 150–400)
RBC: 5.93 MIL/uL — ABNORMAL HIGH (ref 4.22–5.81)
RDW: 20.3 % — ABNORMAL HIGH (ref 11.5–15.5)
WBC: 11.7 10*3/uL — ABNORMAL HIGH (ref 4.0–10.5)
nRBC: 0 % (ref 0.0–0.2)

## 2019-04-08 LAB — BASIC METABOLIC PANEL
Anion gap: 15 (ref 5–15)
Anion gap: 14 (ref 5–15)
BUN: 31 mg/dL — ABNORMAL HIGH (ref 6–20)
BUN: 26 mg/dL — ABNORMAL HIGH (ref 6–20)
CO2: 15 mmol/L — ABNORMAL LOW (ref 22–32)
CO2: 14 mmol/L — ABNORMAL LOW (ref 22–32)
Calcium: 9.7 mg/dL (ref 8.9–10.3)
Calcium: 9.8 mg/dL (ref 8.9–10.3)
Chloride: 107 mmol/L (ref 98–111)
Chloride: 110 mmol/L (ref 98–111)
Creatinine, Ser: 1.83 mg/dL — ABNORMAL HIGH (ref 0.61–1.24)
Creatinine, Ser: 1.17 mg/dL (ref 0.61–1.24)
GFR calc Af Amer: 46 mL/min — ABNORMAL LOW (ref >60)
GFR calc Af Amer: >60 mL/min (ref >60)
GFR calc non Af Amer: 40 mL/min — ABNORMAL LOW (ref >60)
GFR calc non Af Amer: >60 mL/min (ref >60)
Glucose, Bld: 124 mg/dL — ABNORMAL HIGH (ref 70–99)
Glucose, Bld: 131 mg/dL — ABNORMAL HIGH (ref 70–99)
Potassium: 2.4 mmol/L — CL (ref 3.5–5.1)
Potassium: 3.7 mmol/L (ref 3.5–5.1)
Sodium: 137 mmol/L (ref 135–145)
Sodium: 138 mmol/L (ref 135–145)

## 2019-04-08 LAB — URINALYSIS, ROUTINE W REFLEX MICROSCOPIC
Bilirubin Urine: NEGATIVE
Glucose, UA: NEGATIVE mg/dL
Hgb urine dipstick: NEGATIVE
Ketones, ur: NEGATIVE mg/dL
Leukocytes,Ua: NEGATIVE
Nitrite: NEGATIVE
Protein, ur: 30 mg/dL — AB
Specific Gravity, Urine: 1.026 (ref 1.005–1.030)
pH: 5 (ref 5.0–8.0)

## 2019-04-08 LAB — MAGNESIUM: Magnesium: 1.8 mg/dL (ref 1.7–2.4)

## 2019-04-08 LAB — COMPREHENSIVE METABOLIC PANEL
ALT: 17 U/L (ref 0–44)
AST: 22 U/L (ref 15–41)
Albumin: 3.2 g/dL — ABNORMAL LOW (ref 3.5–5.0)
Alkaline Phosphatase: 154 U/L — ABNORMAL HIGH (ref 38–126)
Anion gap: 19 — ABNORMAL HIGH (ref 5–15)
BUN: 34 mg/dL — ABNORMAL HIGH (ref 6–20)
CO2: 14 mmol/L — ABNORMAL LOW (ref 22–32)
Calcium: 10.4 mg/dL — ABNORMAL HIGH (ref 8.9–10.3)
Chloride: 102 mmol/L (ref 98–111)
Creatinine, Ser: 2.47 mg/dL — ABNORMAL HIGH (ref 0.61–1.24)
GFR calc Af Amer: 32 mL/min — ABNORMAL LOW (ref >60)
GFR calc non Af Amer: 27 mL/min — ABNORMAL LOW (ref >60)
Glucose, Bld: 152 mg/dL — ABNORMAL HIGH (ref 70–99)
Potassium: 2.6 mmol/L — CL (ref 3.5–5.1)
Sodium: 135 mmol/L (ref 135–145)
Total Bilirubin: 0.4 mg/dL (ref 0.3–1.2)
Total Protein: 8 g/dL (ref 6.5–8.1)

## 2019-04-08 LAB — TROPONIN I (HIGH SENSITIVITY)
Troponin I (High Sensitivity): 50 ng/L — ABNORMAL HIGH (ref <18)
Troponin I (High Sensitivity): 35 ng/L — ABNORMAL HIGH (ref <18)

## 2019-04-08 LAB — C DIFFICILE QUICK SCREEN W PCR REFLEX
C Diff antigen: NEGATIVE
C Diff interpretation: NOT DETECTED
C Diff toxin: NEGATIVE

## 2019-04-08 LAB — LIPASE, BLOOD: Lipase: 16 U/L (ref 11–51)

## 2019-04-08 LAB — LACTIC ACID, PLASMA
Lactic Acid, Venous: 3.5 mmol/L (ref 0.5–1.9)
Lactic Acid, Venous: 3.9 mmol/L (ref 0.5–1.9)
Lactic Acid, Venous: 3.5 mmol/L (ref 0.5–1.9)

## 2019-04-08 LAB — POC OCCULT BLOOD, ED: Fecal Occult Bld: NEGATIVE

## 2019-04-08 LAB — SARS CORONAVIRUS 2 (TAT 6-24 HRS): SARS Coronavirus 2: NEGATIVE

## 2019-04-08 MED ORDER — POTASSIUM CHLORIDE IN NACL 40-0.9 MEQ/L-% IV SOLN
INTRAVENOUS | Status: DC
  Administered 2019-04-08 – 2019-04-11 (×8): 125 mL/h via INTRAVENOUS
  Filled 2019-04-08 (×8): qty 1000

## 2019-04-08 MED ORDER — POTASSIUM CHLORIDE 10 MEQ/100ML IV SOLN
10.0000 meq | INTRAVENOUS | Status: AC
  Administered 2019-04-08 (×2): 10 meq via INTRAVENOUS
  Filled 2019-04-08 (×2): qty 100

## 2019-04-08 MED ORDER — SODIUM CHLORIDE 0.9 % IV BOLUS
1000.0000 mL | Freq: Once | INTRAVENOUS | Status: AC
  Administered 2019-04-08: 1000 mL via INTRAVENOUS

## 2019-04-08 MED ORDER — ACETAMINOPHEN 650 MG RE SUPP: 650.0000 mg | Freq: Four times a day (QID) | RECTAL | Status: DC | PRN

## 2019-04-08 MED ORDER — ZINC OXIDE 40 % EX OINT
1.0000 "application " | TOPICAL_OINTMENT | Freq: Two times a day (BID) | CUTANEOUS | Status: DC
  Administered 2019-04-08 – 2019-04-12 (×9): 1 via TOPICAL
  Filled 2019-04-08 (×3): qty 57

## 2019-04-08 MED ORDER — ONDANSETRON HCL 4 MG/2ML IJ SOLN: 4.0000 mg | Freq: Four times a day (QID) | INTRAMUSCULAR | Status: DC | PRN

## 2019-04-08 MED ORDER — SODIUM CHLORIDE 0.9 % IV BOLUS
500.0000 mL | Freq: Once | INTRAVENOUS | Status: AC
  Administered 2019-04-08: 500 mL via INTRAVENOUS

## 2019-04-08 MED ORDER — ONDANSETRON HCL 4 MG/2ML IJ SOLN
4.0000 mg | Freq: Once | INTRAMUSCULAR | Status: AC
  Administered 2019-04-08: 4 mg via INTRAVENOUS
  Filled 2019-04-08: qty 2

## 2019-04-08 MED ORDER — TAMSULOSIN HCL 0.4 MG PO CAPS
0.4000 mg | ORAL_CAPSULE | Freq: Every day | ORAL | Status: DC
  Administered 2019-04-08 – 2019-04-12 (×5): 0.4 mg via ORAL
  Filled 2019-04-08 (×5): qty 1

## 2019-04-08 MED ORDER — OXYCODONE HCL 5 MG PO TABS
5.0000 mg | ORAL_TABLET | ORAL | Status: DC | PRN
  Administered 2019-04-09 – 2019-04-10 (×3): 5 mg via ORAL
  Filled 2019-04-08 (×3): qty 1

## 2019-04-08 MED ORDER — POTASSIUM CHLORIDE CRYS ER 20 MEQ PO TBCR
40.0000 meq | EXTENDED_RELEASE_TABLET | Freq: Once | ORAL | Status: AC
  Administered 2019-04-08: 40 meq via ORAL
  Filled 2019-04-08: qty 2

## 2019-04-08 MED ORDER — ACETAMINOPHEN 325 MG PO TABS
650.0000 mg | ORAL_TABLET | Freq: Four times a day (QID) | ORAL | Status: DC | PRN
  Administered 2019-04-08: 650 mg via ORAL
  Filled 2019-04-08: qty 2

## 2019-04-08 MED ORDER — DICYCLOMINE HCL 10 MG PO CAPS
10.0000 mg | ORAL_CAPSULE | Freq: Once | ORAL | Status: AC
  Administered 2019-04-08: 10 mg via ORAL
  Filled 2019-04-08: qty 1

## 2019-04-08 MED ORDER — ONDANSETRON HCL 4 MG PO TABS: 4.0000 mg | ORAL_TABLET | Freq: Four times a day (QID) | ORAL | Status: DC | PRN

## 2019-04-08 MED ORDER — PANTOPRAZOLE SODIUM 40 MG PO TBEC
40.0000 mg | DELAYED_RELEASE_TABLET | Freq: Every day | ORAL | Status: DC
  Administered 2019-04-08 – 2019-04-12 (×5): 40 mg via ORAL
  Filled 2019-04-08 (×5): qty 1

## 2019-04-08 MED ORDER — RISPERIDONE 2 MG PO TABS
2.0000 mg | ORAL_TABLET | Freq: Every day | ORAL | Status: DC
  Administered 2019-04-09 – 2019-04-11 (×4): 2 mg via ORAL
  Filled 2019-04-08 (×6): qty 1

## 2019-04-08 MED ORDER — MORPHINE SULFATE (PF) 4 MG/ML IV SOLN
4.0000 mg | Freq: Once | INTRAVENOUS | Status: AC
  Administered 2019-04-08: 4 mg via INTRAVENOUS
  Filled 2019-04-08: qty 1

## 2019-04-08 MED ORDER — FESOTERODINE FUMARATE ER 4 MG PO TB24
4.0000 mg | ORAL_TABLET | Freq: Every day | ORAL | Status: DC
  Administered 2019-04-08 – 2019-04-12 (×5): 4 mg via ORAL
  Filled 2019-04-08 (×5): qty 1

## 2019-04-08 MED ORDER — LOPERAMIDE HCL 2 MG PO CAPS: 2.0000 mg | ORAL_CAPSULE | Freq: Four times a day (QID) | ORAL | Status: DC | PRN

## 2019-04-08 MED ORDER — ACETAMINOPHEN 325 MG PO TABS: 650.0000 mg | ORAL_TABLET | Freq: Four times a day (QID) | ORAL | Status: DC | PRN

## 2019-04-08 MED ORDER — DIVALPROEX SODIUM 125 MG PO CSDR
500.0000 mg | DELAYED_RELEASE_CAPSULE | Freq: Two times a day (BID) | ORAL | Status: DC
  Administered 2019-04-08 – 2019-04-12 (×9): 500 mg via ORAL
  Filled 2019-04-08 (×11): qty 4

## 2019-04-08 MED ORDER — ENOXAPARIN SODIUM 40 MG/0.4ML ~~LOC~~ SOLN
40.0000 mg | SUBCUTANEOUS | Status: DC
  Administered 2019-04-08 – 2019-04-11 (×4): 40 mg via SUBCUTANEOUS
  Filled 2019-04-08 (×4): qty 0.4

## 2019-04-08 NOTE — Progress Notes (Signed)
New Admission Note: ? Arrival Method: via stretcher  Mental Orientation: A/O x 3, self, place, situation Telemetry: Box #21 NSR Assessment: Completed Skin: Refer to flowsheet IV: Left hand  Pain: Tubes: Safety Measures: Safety Fall Prevention Plan discussed with patient. Admission: Completed [facility] [place] Orientation: Patient has been orientated to the room, unit and the staff. Family: Orders have been reviewed and are being implemented. Will continue to monitor the patient. Call light has been placed within reach and bed alarm has been activated.  ? [employer], [place]

## 2019-04-08 NOTE — ED Provider Notes (Signed)
[facility] EMERGENCY DEPARTMENT Provider Note   CSN: [id number] Arrival date & time: 04/08/19  7829     History   Chief Complaint Chief Complaint  Patient presents with   Diarrhea    HPI [name] is a 58 y.o. male.     HPI   Patient is a 58 year old male with a history of anxiety, bipolar disorder, cerebral palsy, constipation, depression, gastric ulcer, GERD, hiatal hernia, hyperlipidemia, recurrent UTI, who presents the emergency department today for evaluation of abdominal pain and diarrhea.  States that he has had bilateral lower abdominal pain for the last several days.  It is intermittent.  Currently pain rated 5-6/10.  It is associated with diarrhea.  He is having 1-2 episodes per day for the last few days.  He has had no blood in his stool.  He reports nausea but no vomiting.  No fevers.  He does report dysuria and urinary frequency.  Denies hematuria.    He was seen in the ED 2 days ago with similar presentation.  At that time he had negative C. difficile, negative GI panel.  Labs are reassuring and CT scan which showed gastric wall thickening, mild bladder wall thickening and cholelithiasis.  He was discharged with Rx for loperamide and advised to follow-up with GI.  He also had colonoscopy 08/14/2018 that showed diverticulosis and nonbleeding internal hemorrhoids but was otherwise within normal notes.  Past Medical History:  Diagnosis Date   Anxiety    takes Ativan daily as needed   Bipolar disorder (HCC)    takes Depakote daily   CEREBRAL PALSY 07/24/2006   Chronic bronchitis (HCC)    "gets it q yr"   Constipation    takes Miralax daily as needed   Depression    takes Risperdal nightly   Environmental allergies    takes Zyrtec daily   GASTRIC ULCER ACUTE WITH HEMORRHAGE 07/24/2006   GERD (gastroesophageal reflux disease)    takes Pravacid daily   History of blood transfusion    no abnormal reaction noted   History of hiatal  hernia    History of shingles    History of stomach ulcers ~ 1996   Hypercholesterolemia    Pneumonia "quite a few times"   in 2015 and in June 2016   Recurrent UTI (urinary tract infection)    URINARY INCONTINENCE 02/09/2010   takes Detrol daily-occasionally incontinence   Weakness    numbness in extremities    Patient Active Problem List   Diagnosis Date Noted   AKI (acute kidney injury) (HCC) 04/08/2019   Pressure injury of skin 08/10/2018   Gastric wall thickening 08/10/2018   Alkaline phosphatase elevation 08/10/2018   Cholelithiasis 08/10/2018   Microcytic anemia    Hiatal hernia 04/07/2018   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) 01/20/2016   Quadriplegia (HCC) 12/15/2015   Cerebral palsy, quadriplegic (HCC)    Pre-diabetes 08/31/2015   Foot ulcer, limited to breakdown of skin (HCC) 06/08/2015   Constipation 11/01/2014   Seasonal allergies 11/01/2014   Pressure ulcer of foot 03/14/2014   URINARY INCONTINENCE 02/09/2010   Mental retardation 07/24/2006   Infantile cerebral palsy (HCC) 07/24/2006    Past Surgical History:  Procedure Laterality Date   AMPUTATION Right 12/14/2014   Procedure: AMPUTATION DIGIT RIGHT GREAT TOE MTP;  Surgeon: Nadara Mustard, MD;  Location: [facility] OR;  Service: Orthopedics;  Laterality: Right;   BIOPSY  08/12/2018   Procedure: BIOPSY;  Surgeon: Lynann Bologna,  MD;  Location: [facility] ENDOSCOPY;  Service: Gastroenterology;;   COLONOSCOPY WITH PROPOFOL N/A 08/14/2018   Procedure: COLONOSCOPY WITH PROPOFOL;  Surgeon: Lynann BolognaGupta, Rajesh, MD;  Location: [facility] ENDOSCOPY;  Service: Gastroenterology;  Laterality: N/A;   ESOPHAGOGASTRODUODENOSCOPY (EGD) WITH PROPOFOL N/A 08/12/2018   Procedure: ESOPHAGOGASTRODUODENOSCOPY (EGD) WITH PROPOFOL;  Surgeon: Lynann BolognaGupta, Rajesh, MD;  Location: [facility] ENDOSCOPY;  Service: Gastroenterology;  Laterality: N/A;   head surgery  as a baby   due to "water head"    HERNIA REPAIR     HIATAL HERNIA  REPAIR  1982   LEG SURGERY Bilateral ~ 1967   "legs were scissored; stretched them out"        Home Medications    Prior to Admission medications   Medication Sig Start Date End Date Taking? Authorizing Provider  acetaminophen (TYLENOL) 500 MG tablet Take 1,000 mg by mouth every 6 (six) hours as needed for moderate pain.    [provider]  alum & mag hydroxide-simeth (MYLANTA) 200-200-20 MG/5ML suspension Take 30 mLs by mouth every 6 (six) hours as needed for indigestion or heartburn. Use as directed as needed for nausea/indigestion per standing order    [provider]  baclofen (LIORESAL) 20 MG tablet Take 1 tablet (20 mg total) by mouth 3 (three) times daily. 07/18/16   Kirsteins, Victorino SparrowAndrew E, MD  bisacodyl (DULCOLAX) 5 MG EC tablet Take 2 tablets (10 mg total) by mouth daily as needed for moderate constipation. 08/15/18   Rodolph Bonghompson, Daniel V, MD  brompheniramine-pseudoephedrine (DIMETAPP) 1-15 MG/5ML ELIX Take 10 mLs by mouth 2 (two) times daily as needed (cold symptoms). Use as directed as needed for cold symptoms per standing orders    [provider]  cetirizine (ZYRTEC) 10 MG tablet Take 1 tablet (10 mg total) by mouth daily. Patient taking differently: Take 10 mg by mouth at bedtime.  11/01/14   Street, Stephanie Couphristopher M, MD  Cholecalciferol (VITAMIN D3) 5000 units CAPS Take 5,000 Units by mouth daily.    [provider]  diclofenac sodium (VOLTAREN) 1 % GEL Apply 4 g topically 4 (four) times daily.    [provider]  divalproex (DEPAKOTE SPRINKLE) 125 MG capsule Take 500 mg by mouth 2 (two) times daily.     [provider]  docusate sodium (COLACE) 100 MG capsule Take 1 capsule (100 mg total) by mouth 2 (two) times daily. 04/07/18   Mullis, Kiersten P, DO  EAR DROPS EARWAX AID 6.5 % OTIC solution PLACE 5 DROPS INTO THE LEFT EAR TWICE DAILY Patient taking differently: Place 5 drops into the left ear 2 (two) times daily.  04/21/18   Mullis,  Kiersten P, DO  Elastic Bandages & Supports (JOBST ANTI-EM KNEE HIGH MED) MISC by Does not apply route. Apply once in the morning and remove at bedtime daily    [provider]  Foot Care Products (PREVALON HEEL PROTECTOR) MISC 1 Device by Does not apply route daily. 06/20/15   Marquette SaaLancaster, Abigail Joseph, MD  guaifenesin (ROBITUSSIN) 100 MG/5ML syrup Take 200 mg by mouth as needed for cough.    [provider]  hydrocortisone cream 1 % APPLY TOPICALLY TO FACE AT BEDTIME Patient taking differently: Apply 1 application topically at bedtime.  08/15/17   Margit HanksAlexander, Anne D, MD  ketoconazole (NIZORAL) 2 % shampoo APPLY TOPICALLY TO AFFECTED AREA(S) TWICE WEEKLY AS DIRECTED Patient taking differently: Apply 1 application topically 2 (two) times a week.  10/22/17   Beaulah DinningGambino, Christina M, MD  loperamide (IMODIUM) 2 MG capsule  Take 1 capsule (2 mg total) by mouth 4 (four) times daily as needed for diarrhea or loose stools. 04/06/19   Volanda Napoleon, PA-C  LORazepam (ATIVAN) 2 MG tablet Take 1 mg by mouth every 4 (four) hours as needed for anxiety.     [provider]  magnesium hydroxide (MILK OF MAGNESIA) 400 MG/5ML suspension Take 30 mLs by mouth daily as needed for moderate constipation. Take as directed as needed for constipation per standing order    [provider]  Neomycin-Bacitracin-Polymyxin (TRIPLE ANTIBIOTIC EX) Apply 1 application topically as needed (cuts/scrapes/scratches).    [provider]  omeprazole (PRILOSEC) 40 MG capsule Take 1 capsule (40 mg total) by mouth 2 (two) times daily. 08/15/18   Eugenie Filler, MD  Oyster Shell (OYSTER CALCIUM) 500 MG TABS tablet Take 1,500 mg of elemental calcium by mouth daily.    [provider]  polyethylene glycol (MIRALAX) packet Take 17 g by mouth 2 (two) times daily. 08/15/18   Eugenie Filler, MD  risperiDONE (RISPERDAL) 2 MG tablet Take 2 mg by mouth at bedtime.    [provider]    tamsulosin (FLOMAX) 0.4 MG CAPS capsule TAKE 1 CAPSULE BY MOUTH EVERY DAY *DO NOT CRUSH* Patient taking differently: Take 0.4 mg by mouth daily.  10/01/18   Mullis, Kiersten P, DO  tiZANidine (ZANAFLEX) 2 MG tablet Take 2 mg by mouth 3 (three) times daily.  07/22/18   [provider]  tolterodine (DETROL LA) 2 MG 24 hr capsule Take 1 capsule (2 mg total) by mouth daily. Patient taking differently: Take 2 mg by mouth daily at 6 PM.  04/08/13   Street, Sharon Mt, MD  tretinoin (RETIN-A) 0.025 % gel Apply 1 application topically at bedtime.    [provider]  triamcinolone cream (KENALOG) 0.1 % Apply 1 application topically daily.     [provider]  vitamin C (VITAMIN C) 250 MG tablet Take 1 tablet (250 mg total) by mouth daily. 01/18/16   Smiley Houseman, MD  Zinc Oxide 40 % PSTE APPLY A THIN LAYER TOPICALLY TO BUTTOCKS AND SCROTUM TWICE DAILY *BEFIN AFTER COMPLETION OF CLOTRIMAZOLE OINTMENT* Patient taking differently: Apply 1 application topically 2 (two) times daily. To scrotum and buttocks *Begin after completion of clotrimazole ointment* 09/18/17   Steve Rattler, DO    Family History Family History  Adopted: Yes    Social History Social History   Tobacco Use   Smoking status: Never Smoker   Smokeless tobacco: Never Used  Substance Use Topics   Alcohol use: No   Drug use: No     Allergies   Adhesive [tape]   Review of Systems Review of Systems  Constitutional: Negative for fever.  HENT: Negative for ear pain and sore throat.   Eyes: Negative for visual disturbance.  Respiratory: Negative for cough and shortness of breath.   Cardiovascular: Negative for chest pain.  Gastrointestinal: Positive for abdominal pain and nausea. Negative for blood in stool, diarrhea and vomiting.  Genitourinary: Positive for dysuria and frequency. Negative for hematuria.  Musculoskeletal: Negative for back pain.  Skin: Negative for rash.  Neurological:  Negative for headaches.  All other systems reviewed and are negative.    Physical Exam Updated Vital Signs BP 106/79    Pulse 88    Temp 99.6 F (37.6 C) (Rectal)    Resp 20    SpO2 98%   Physical Exam Vitals signs and nursing note reviewed.  Constitutional:  Appearance: He is well-developed.  HENT:     Head: Normocephalic and atraumatic.  Eyes:     Conjunctiva/sclera: Conjunctivae normal.  Neck:     Musculoskeletal: Neck supple.  Cardiovascular:     Rate and Rhythm: Normal rate and regular rhythm.     Heart sounds: Normal heart sounds. No murmur.  Pulmonary:     Effort: Pulmonary effort is normal. No respiratory distress.     Breath sounds: Decreased breath sounds present. No wheezing, rhonchi or rales.  Abdominal:     General: Bowel sounds are normal.     Palpations: Abdomen is soft.     Tenderness: There is abdominal tenderness in the right lower quadrant and left lower quadrant.  Skin:    General: Skin is warm and dry.  Neurological:     Mental Status: He is alert.     ED Treatments / Results  Labs (all labs ordered are listed, but only abnormal results are displayed) Labs Reviewed  CBC WITH DIFFERENTIAL/PLATELET - Abnormal; Notable for the following components:      Result Value   WBC 11.7 (*)    RBC 5.93 (*)    MCV 76.9 (*)    MCH 24.1 (*)    RDW 20.3 (*)    Platelets 438 (*)    Monocytes Absolute 2.2 (*)    Abs Immature Granulocytes 0.21 (*)    All other components within normal limits  COMPREHENSIVE METABOLIC PANEL - Abnormal; Notable for the following components:   Potassium 2.6 (*)    CO2 14 (*)    Glucose, Bld 152 (*)    BUN 34 (*)    Creatinine, Ser 2.47 (*)    Calcium 10.4 (*)    Albumin 3.2 (*)    Alkaline Phosphatase 154 (*)    GFR calc non Af Amer 27 (*)    GFR calc Af Amer 32 (*)    Anion gap 19 (*)    All other components within normal limits  LACTIC ACID, PLASMA - Abnormal; Notable for the following components:   Lactic Acid,  Venous 3.5 (*)    All other components within normal limits  LACTIC ACID, PLASMA - Abnormal; Notable for the following components:   Lactic Acid, Venous 3.9 (*)    All other components within normal limits  URINE CULTURE  CULTURE, BLOOD (ROUTINE X 2)  CULTURE, BLOOD (ROUTINE X 2)  SARS CORONAVIRUS 2 (TAT 6-24 HRS)  GASTROINTESTINAL PANEL BY PCR, STOOL (REPLACES STOOL CULTURE)  C DIFFICILE QUICK SCREEN W PCR REFLEX  LIPASE, BLOOD  MAGNESIUM  URINALYSIS, ROUTINE W REFLEX MICROSCOPIC  BASIC METABOLIC PANEL  OCCULT BLOOD X 1 CARD TO LAB, STOOL  TROPONIN I (HIGH SENSITIVITY)    EKG EKG Interpretation  Date/Time:  Thursday April 08 2019 11:55:45 EST Ventricular Rate:  78 PR Interval:    QRS Duration: 103 QT Interval:  502 QTC Calculation: 572 R Axis:   67 Text Interpretation: Sinus rhythm Repol abnrm suggests ischemia, diffuse leads Prolonged QT interval Prolonged QT new/worse than 2016 Confirmed by Pricilla Loveless [phone]) on 04/08/2019 1:27:29 PM   Radiology Ct Abdomen Pelvis W Contrast  Result Date: 04/06/2019 CLINICAL DATA:  Right-sided abdominal pain EXAM: CT ABDOMEN AND PELVIS WITH CONTRAST TECHNIQUE: Multidetector CT imaging of the abdomen and pelvis was performed using the standard protocol following bolus administration of intravenous contrast. CONTRAST:  OMNIPAQUE IOHEXOL 300 MG/ML  SOLN COMPARISON:  03/03/2018 FINDINGS: Lower chest: Right base collapse/consolidation with small right pleural effusion. Large  hiatal hernia noted. Hepatobiliary: No suspicious focal abnormality within the liver parenchyma. Tiny gallstone evident. No gallbladder wall thickening or pericholecystic fluid. Trace periportal edema evident. No biliary dilatation. Pancreas: Diffuse atrophy of pancreatic parenchyma with prominence of the main pancreatic duct, similar to prior. Spleen: No splenomegaly. No focal mass lesion. Adrenals/Urinary Tract: No adrenal nodule or mass. Kidneys unremarkable.  No evidence for hydroureter. Mild circumferential bladder wall thickening evident. Stomach/Bowel: Large hiatal hernia. Gastric wall thickening noted in the herniated segment of the stomach. Duodenum does not cross the midline suggesting intestinal malrotation. No small bowel dilatation to suggest obstruction. Right colon is fluid-filled and moderately distended without substantial wall thickening. Vascular/Lymphatic: No abdominal aortic aneurysm. Portal vein, superior mesenteric vein, and splenic vein are patent. There is no gastrohepatic or hepatoduodenal ligament lymphadenopathy. No intraperitoneal or retroperitoneal lymphadenopathy. No pelvic sidewall lymphadenopathy. Reproductive: The prostate gland and seminal vesicles are unremarkable. Other: No intraperitoneal free fluid. Musculoskeletal: No worrisome lytic or sclerotic osseous abnormality. IMPRESSION: 1. No acute findings in the abdomen or pelvis. No findings to explain the patient's history of right abdominal pain. 2. Similar appearance of large hiatal hernia with associated gastric wall thickening. Gastritis not excluded. 3. Mild circumferential bladder wall thickening, likely accentuated by underdistention. UTI not excluded. 4. Cholelithiasis 5. Right base collapse/consolidation with small right pleural effusion. Electronically Signed   By: Kennith Center M.D.   On: 04/06/2019 14:56   Dg Chest Portable 1 View  Result Date: 04/08/2019 CLINICAL DATA:  Right pleural effusion on CT abdomen EXAM: PORTABLE CHEST 1 VIEW COMPARISON:  2017 FINDINGS: Suboptimal evaluation due to limitation on patient positioning. Hazy increased density of right lung likely reflects layering pleural effusion and adjacent atelectasis seen on CT. Left lung appears clear. Stable cardiomediastinal contours, which are not well evaluated due to positioning. Large hiatal hernia. IMPRESSION: Hazy increased density of right lung likely reflecting known pleural effusion and adjacent  atelectasis. Electronically Signed   By: Guadlupe Spanish M.D.   On: 04/08/2019 11:07    Procedures Procedures (including critical care time)  Medications Ordered in ED Medications  risperiDONE (RISPERDAL) tablet 2 mg (has no administration in time range)  pantoprazole (PROTONIX) EC tablet 40 mg (has no administration in time range)  tamsulosin (FLOMAX) capsule 0.4 mg (has no administration in time range)  divalproex (DEPAKOTE SPRINKLE) capsule 500 mg (has no administration in time range)  Zinc Oxide 40 % PSTE 1 application (has no administration in time range)  fesoterodine (TOVIAZ) tablet 4 mg (has no administration in time range)  enoxaparin (LOVENOX) injection 40 mg (has no administration in time range)  oxyCODONE (Oxy IR/ROXICODONE) immediate release tablet 5 mg (has no administration in time range)  ondansetron (ZOFRAN) tablet 4 mg (has no administration in time range)    Or  ondansetron (ZOFRAN) injection 4 mg (has no administration in time range)  0.9 % NaCl with KCl 40 mEq / L  infusion (has no administration in time range)  sodium chloride 0.9 % bolus 1,000 mL (has no administration in time range)  acetaminophen (TYLENOL) tablet 650 mg (has no administration in time range)    Or  acetaminophen (TYLENOL) suppository 650 mg (has no administration in time range)  potassium chloride SA (KLOR-CON) CR tablet 40 mEq (has no administration in time range)  sodium chloride 0.9 % bolus 1,000 mL (0 mLs Intravenous Stopped 04/08/19 1226)  dicyclomine (BENTYL) capsule 10 mg (10 mg Oral Given 04/08/19 1006)  ondansetron (ZOFRAN) injection 4 mg (4 mg Intravenous  Given 04/08/19 1005)  morphine 4 MG/ML injection 4 mg (4 mg Intravenous Given 04/08/19 1005)  potassium chloride SA (KLOR-CON) CR tablet 40 mEq (40 mEq Oral Given 04/08/19 1121)  potassium chloride 10 mEq in 100 mL IVPB (10 mEq Intravenous New Bag/Given 04/08/19 1226)  sodium chloride 0.9 % bolus 1,000 mL (0 mLs Intravenous Stopped  04/08/19 1124)     Initial Impression / Assessment and Plan / ED Course  I have reviewed the triage vital signs and the nursing notes.  Pertinent labs & imaging results that were available during my care of the patient were reviewed by me and considered in my medical decision making (see chart for details).  Final Clinical Impressions(s) / ED Diagnoses   Final diagnoses:  Acute renal failure, unspecified acute renal failure type [facility])  Hypokalemia   58 year old male presenting for evaluation of diarrhea and abdominal pain.  Seen for similar in the ED 2 days ago with reassuring work-up including reassuring CT abdomen/pelvis.  Discharged with antidiarrheal and GI follow-up.  Had colonoscopy earlier this year with evidence of diverticulosis and hemorrhoids.  CBC with mild leukocytosis with left shift and thrombocytosis. Suspect hemoconcentration CMP with hypokalemia, potassium 2.6, bicarb low at 14, elevated BUN/creatinine at 34/2.47 which is significantly worse from prior labs 2 days ago.  - Given K oral and IV Lipase WNL Lactic elevated at 3.5, given 2L IVF.   - have low suspicion for sepsis and suspect this is secondary to dehydration and renal failure UA pending on admission   EKG with Sinus rhythm Repol abnrm suggests ischemia, diffuse leads Prolonged QT interval Prolonged QT new/worse than 2016   CXR with hazy increased density of right lung likely reflecting known pleural effusion and adjacent atelectasis.  Given IVF, zofran, bentyl, pain meds, and on reassessment he does feel some improvement of his pain.   Given laboratory work demonstrating acute renal failure and hypokalemia, likely 2/2 diarrhea will plan for admission for further tx.   11:44 PM Discussed case with Dr. Mauri Reading with family medicine residency who accepts patient for admission.   ED Discharge Orders    None       Rayne Du 04/08/19 1341    Pricilla Loveless, MD 04/08/19 [phone]

## 2019-04-08 NOTE — ED Notes (Signed)
Provider notified of elevated Potassium level, will give PO Potassium and wait for next blood draw.

## 2019-04-08 NOTE — ED Notes (Signed)
Soft dinner tray ordered 

## 2019-04-08 NOTE — Consult Note (Signed)
WOC Nurse Consult Note: Patient receiving care in [facility] ED 41.  Consult completed remotely after review of record and images. Reason for Consult: bilateral ankle PIs Wound type: Left lateral midfoot, right medial malleolus, and left medial malleolus wounds are consistent with DTPIs.  The left medial malleolus appears to be evolving from a DTPI toward an unstageable wound however. Pressure Injury POA: Yes Measurement: To be provided by the bedside RN in the flowsheet section Wound bed: see photos Drainage (amount, consistency, odor) none Periwound: see photos.  Heavy skin flaking present to left medial malleolus area. Dressing procedure/placement/frequency:  Cleanse left lateral ankle with saline. Pat dry. Apply liberal amount of Sween Moisturizing ointment (pink and white tube in clean utility). Cover with a Xeroform gauze Kellie Simmering [phone]). Secure with kerlex. Change daily.  Size appropriate foams to the other two wounds.  Off loading with Prevalon boots. Monitor the wound area(s) for worsening of condition such as: Signs/symptoms of infection,  Increase in size,  Development of or worsening of odor, Development of pain, or increased pain at the affected locations.  Notify the medical team if any of these develop.  Thank you for the consult.  [place] nurse will not follow at this time.  Please re-consult the [facility] team if needed.  Val Riles, RN, MSN, CWOCN, CNS-BC, pager [phone]

## 2019-04-08 NOTE — ED Notes (Signed)
Got patient undress on the monitor patient is resting with nurse at bedside 

## 2019-04-08 NOTE — H&P (Addendum)
[facility] Admission History and Physical Service Pager: [phone]  Patient name: [name] Medical record number: [id number] Date of birth: [birth year] Age: 58 y.o. Gender: male  Primary Care Provider: [name], DO Consultants: none Code Status: Full Code  Emergency Contact: [name], Group Home Manager: [name] [phone]   Chief Complaint: Persistent diarrhea  Assessment and Plan: [name] is a 58 y.o. male presenting from [facility] home with persistent diarrhea and right lower abdominal pain. PMH is significant for cerebral palsy (quadriplegic), cholelithiasis, chronic large hiatal hernia, h/o DVT, h/o constipation, h/o pressure injuries, urinary incontinence.   Persistent Diarrhea  Right Lower Abdominal Pain  Lactic Acidosis: Patient presenting with persistent watery diarrhea since 11/8 and intermittent right lower abdominal pain that did not improve with scheduled Immodium. Unclear etiology at this time. Infectious etiology possible however GI panel and C. Diff PCR negative on 11/10 and no other other sick contacts in group home makes this slighly less likely. Only mild leukocytosis of 11.7, afebrile and no other infectious symptoms. Some concern for ischemic bowel given pain and diarrhea. CT abdomen/pelvis on 11/10 notable for mod-severe hiatal hernia with chronic gastritis and possible intestinal malrotation without obstruction, right colon with only moderate distention without substantial wall thickening. Patient does have a lactic acidosis of 3.5. Lactase WNL. Patient with recent colonoscopy and endoscopy in March 2020 which was notable for mild sigmoid diverticulosis otherwise normal. Thus less likely diverticulitis causing right sided abdominal pain. Endoscopy notable for moderate-large hiatal hernia with slipped Nissen's fundoplication and chronic moderate gastritis.   Patient is s/p Bentyl 75m, Morphine 419mIV x 1, and IV Zofran 46m13m 1 in the ED with improvement in pain. Patient presented with signs of mild-moderate dehyration with hypokalemia and decreased urine output with continued water diarrhea. Vital signs stable. Patient will be admitted for fluid resuscitation and further work-up. -admit to FPTS, med-tele, attending Dr. EniGwendlyn [name] s/p 2L NS bolus in ED - give another 1L NS bolus  - 125m51m IVF + 40mE22m- trend lactic acid  - AM CBC, BMP - 1500 BMP - repeat GI panel, C.diff - follow up FOBT - consider repeat CTA abdomen/pelvis scan if FOBT positive or if pain worsens to evaluate for ischemic bowel  - Tylenol and Oxy IR 5mg P52m- consider restarting scheduled Loperamide if workup inconclusive - strict I/O's, daily weights - soft diet as tolerated - follow up COVID test, blood culture, repeat UA and urine culture  AKI  Oligouria: Cr 2.47, up from baseline of 0.6. UA and urine culture from 11/10 WNL. Repeat UA and culture pending.  S/p 2L NS bolus in the ED. Patient with minimal urine output since arrival to ED. Likely secondary to persistent water diarrhea and dehydration. - s/p 2L NS bolus in ED - Give another 1L NS bolus - continue 125mL N38mth 40mEq K55mavoid nephrotoxic agents - follow up repeat BMP at 1500 - strict I/O's - bladder scan q shift - consider further workup such as FeNa if not improved with fluid resusitation  Hypokalemia: K 2.5 on admission. S/p 60 mEq of potassium in the ED. EKG with NSR. Suspect secondary to diarrhea. - s/p 60 mEq potassium in ED - given another 40mEq at30m0 - follow up repeat CMP at 1500 - continue 125mL NS +73mEq K - 30mMP  - replete as needed  ST Depression in Anterior Leads  Prolonged QTc: ST depression in anterior leads on EKG today. Not appreciated on last EKG in 2016. Denies any chest pain. QTc on EKG notable for 527. Manual calculation notable for Qtc of 456. Patient on Risperdal and  Depakote for Cerebral palsy. - follow up trop level - AM EKG - continue to monitor - Avoid QT prolonging agents   Chronic elevated Alkaline Phosphatase: Alk phos stable at 154. Unclear etiology.  - Recommend continued follow up outpatient.   - follow CMP   Chronic DVT of femoral vein: Not on anticoagulation. No LE edema appreciated. -  Recommend outpatient follow up - Lovenox for DVT prophylaxis  Cerebral Palsy  Quadriplegia: Patient lives in group home. Home meds include: Risperdal and Depakote  - continue home meds  Chronic Pressure Ulcers: Located on ankles bilaterally. No signs of infection. Heel floaters.  - daily wound care - wound care consulted  Code status: Unable to contact sister/guardian [name] to address code status. Will make patient full code until otherwise indicated.  - Plan to attempt to contact sister to discuss  FEN/GI: [place]land diet, MIVF Prophylaxis: Lovenox  Disposition: med-tele  History of Present Illness:  [name] is a 58 y.o. male presenting with persistent diarrhea and lower abdominal pain x 3-4 days (approx 11/8). Caregiver notes this all started with diarrhea. Patient endorses right lower abdominal pain started on 11/10 which prompted his visit to the ED that day. He had negative work up including CT scan, CBC, CMP, lactase and was discharged with Loperamide. He took this regularly since discharge. They note that it improved some during the day, but then overnight had a bowel movement every hour. It improved some the following day but then worsened overnight again which prompted them to return to the ED today. Patient notes intermittent right lower abdominal pain that comes and goes. Improved at this time with Morphine. Patient was able to eat last this morning for breakfast. He tolerated this well.    Describes the diarrhea is intermittently watery and intermittently soft. Denies any blood. Caregiver denies anyone else at the group home  with similar symptoms. Denies any fever, chills, rash, rhinorrhea, cough, congestion. Denies any new medications. Patient does have a history of chronic constipation in which he takes several PRN medications. They did not continue the Doculax, Colace, Mylanta, or Milk of Magnesia. Caregiver does note that he has continued the Miralax - 1 cup full BID. Notes he has been on and off antibiotics throughout the year. Last used Clindamycin 387m in July 2020. Caregiver notes he is prone to dehydration.  At baseline patient is able to feed himself small bites. Drinks through a straw. No difficulty with swallowing. Wheel chair bound. Able to urinate in a urinal independently.   Review Of Systems: Per HPI with the following additions:   Review of Systems  Constitutional: Positive for malaise/fatigue. Negative for chills and fever.  HENT: Negative for congestion and sore throat.   Respiratory: Positive for shortness of breath. Negative for cough, sputum production and wheezing.   Cardiovascular: Negative for chest pain and leg swelling.  Gastrointestinal: Positive for abdominal pain and diarrhea. Negative for blood in stool, nausea and vomiting.  Genitourinary: Negative for dysuria and hematuria.  Skin: Negative for rash.    Patient Active Problem List   Diagnosis Date Noted  . Pressure injury of skin 08/10/2018  . Gastric wall thickening 08/10/2018  . Alkaline phosphatase elevation 08/10/2018  . Cholelithiasis 08/10/2018  . Colon cancer screening   .  Microcytic anemia   . Screening for lipid disorders 06/04/2018  . Hiatal hernia 04/07/2018  . Cerumen impaction 02/04/2017  . 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 Personal history of gastric ulcer 01/20/2016  . Palliative care encounter   . Quadriplegia ([place]) 12/15/2015  . Cerebral palsy, quadriplegic ([place])   . Pre-diabetes 08/31/2015  . Foot ulcer, limited to breakdown of skin ([place]) 06/08/2015  . Constipation  11/01/2014  . Seasonal allergies 11/01/2014  . Pressure ulcer of foot 03/14/2014  . 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 RENAL GLYCOSURIA 02/26/2010  . URINARY INCONTINENCE 02/09/2010  . Person living in residential institution 07/05/2009  . Mental retardation 07/24/2006  . Infantile cerebral palsy ([place]) 07/24/2006    Past Medical History: Past Medical History:  Diagnosis Date  . Anxiety    takes Ativan daily as needed  . Bipolar disorder ([place])    takes Depakote daily  . CEREBRAL PALSY 07/24/2006  . Chronic bronchitis ([place])    "gets it q yr"  . Constipation    takes Miralax daily as needed  . Depression    takes Risperdal nightly  . Environmental allergies    takes Zyrtec daily  . GASTRIC ULCER ACUTE WITH HEMORRHAGE 07/24/2006  . GERD (gastroesophageal reflux disease)    takes Pravacid daily  . History of blood transfusion    no abnormal reaction noted  . History of hiatal hernia   . History of shingles   . History of stomach ulcers ~ 1996  . Hypercholesterolemia   . Pneumonia "quite a few times"   in 2015 and in June 2016  . Recurrent UTI (urinary tract infection)   . URINARY INCONTINENCE 02/09/2010   takes Detrol daily-occasionally incontinence  . Weakness    numbness in extremities    Past Surgical History: Past Surgical History:  Procedure Laterality Date  . AMPUTATION Right 12/14/2014   Procedure: AMPUTATION DIGIT RIGHT GREAT TOE MTP;  Surgeon: Newt Minion, MD;  Location: [facility];  Service: Orthopedics;  Laterality: Right;  . BIOPSY  08/12/2018   Procedure: BIOPSY;  Surgeon: Jackquline Denmark, MD;  Location: [facility];  Service: Gastroenterology;;  . COLONOSCOPY WITH PROPOFOL N/A 08/14/2018   Procedure: COLONOSCOPY WITH PROPOFOL;  Surgeon: Jackquline Denmark, MD;  Location: [facility];  Service: Gastroenterology;  Laterality: N/A;  . ESOPHAGOGASTRODUODENOSCOPY (EGD) WITH PROPOFOL N/A 08/12/2018   Procedure: ESOPHAGOGASTRODUODENOSCOPY (EGD) WITH PROPOFOL;  Surgeon: Jackquline Denmark, MD;  Location: [facility];  Service: Gastroenterology;  Laterality: N/A;  . head surgery  as a baby   due to "water head"   . HERNIA REPAIR    . HIATAL HERNIA REPAIR  1982  . LEG SURGERY Bilateral ~ 1967   "legs were scissored; stretched them out"    Social History: Social History   Tobacco Use  . Smoking status: Never Smoker  . Smokeless tobacco: Never Used  Substance Use Topics  . Alcohol use: No  . Drug use: No   Additional social history: Nonsmoker, nondrinker Please also refer to relevant sections of EMR.  Family History: Family History  Adopted: Yes    Allergies and Medications: Allergies  Allergen Reactions  . Adhesive [Tape] Rash   No current facility-administered medications on file prior to encounter.    Current Outpatient Medications on File Prior to Encounter  Medication Sig Dispense Refill  . acetaminophen (TYLENOL) 500 MG tablet Take 1,000 mg by mouth every 6 (six) hours as needed for moderate pain.    Marland Kitchen  alum & mag hydroxide-simeth (MYLANTA) 975-883-25 MG/5ML suspension Take 30 mLs by mouth every 6 (six) hours as needed for indigestion or heartburn. Use as directed as needed for nausea/indigestion per standing order    . baclofen (LIORESAL) 20 MG tablet Take 1 tablet (20 mg total) by mouth 3 (three) times daily. 90 each 1  . bisacodyl (DULCOLAX) 5 MG EC tablet Take 2 tablets (10 mg total) by mouth daily as needed for moderate constipation. 30 tablet 0  . brompheniramine-pseudoephedrine (DIMETAPP) 1-15 MG/5ML ELIX Take 10 mLs by mouth 2 (two) times daily as needed (cold symptoms). Use as directed as needed for cold symptoms per standing orders    . cetirizine (ZYRTEC) 10 MG tablet Take 1 tablet (10 mg total) by mouth daily. (Patient taking differently: Take 10 mg by mouth at bedtime. ) 30 tablet 3  . Cholecalciferol (VITAMIN D3) 5000 units CAPS Take 5,000 Units by mouth daily.    . diclofenac sodium (VOLTAREN) 1 % GEL Apply 4 g topically 4 (four)  times daily.    . divalproex (DEPAKOTE SPRINKLE) 125 MG capsule Take 500 mg by mouth 2 (two) times daily.     Marland Kitchen docusate sodium (COLACE) 100 MG capsule Take 1 capsule (100 mg total) by mouth 2 (two) times daily. 60 capsule 2  . EAR DROPS EARWAX AID 6.5 % OTIC solution PLACE 5 DROPS INTO THE LEFT EAR TWICE DAILY (Patient taking differently: Place 5 drops into the left ear 2 (two) times daily. ) 15 mL 4  . Elastic Bandages & Supports (JOBST ANTI-EM KNEE HIGH MED) MISC by Does not apply route. Apply once in the morning and remove at bedtime daily    . Foot Care Products (PREVALON HEEL PROTECTOR) MISC 1 Device by Does not apply route daily. 2 each 0  . guaifenesin (ROBITUSSIN) 100 MG/5ML syrup Take 200 mg by mouth as needed for cough.    . hydrocortisone cream 1 % APPLY TOPICALLY TO FACE AT BEDTIME (Patient taking differently: Apply 1 application topically at bedtime. ) 28.4 g 4  . ketoconazole (NIZORAL) 2 % shampoo APPLY TOPICALLY TO AFFECTED AREA(S) TWICE WEEKLY AS DIRECTED (Patient taking differently: Apply 1 application topically 2 (two) times a week. ) 120 mL 4  . loperamide (IMODIUM) 2 MG capsule Take 1 capsule (2 mg total) by mouth 4 (four) times daily as needed for diarrhea or loose stools. 12 capsule 0  . LORazepam (ATIVAN) 2 MG tablet Take 1 mg by mouth every 4 (four) hours as needed for anxiety.     . magnesium hydroxide (MILK OF MAGNESIA) 400 MG/5ML suspension Take 30 mLs by mouth daily as needed for moderate constipation. Take as directed as needed for constipation per standing order    . Neomycin-Bacitracin-Polymyxin (TRIPLE ANTIBIOTIC EX) Apply 1 application topically as needed (cuts/scrapes/scratches).    Marland Kitchen omeprazole (PRILOSEC) 40 MG capsule Take 1 capsule (40 mg total) by mouth 2 (two) times daily. 60 capsule 0  . Oyster Shell (OYSTER CALCIUM) 500 MG TABS tablet Take 1,500 mg of elemental calcium by mouth daily.    . polyethylene glycol (MIRALAX) packet Take 17 g by mouth 2 (two) times  daily. 100 each 0  . risperiDONE (RISPERDAL) 2 MG tablet Take 2 mg by mouth at bedtime.    . tamsulosin (FLOMAX) 0.4 MG CAPS capsule TAKE 1 CAPSULE BY MOUTH EVERY DAY *DO NOT CRUSH* (Patient taking differently: Take 0.4 mg by mouth daily. ) 30 capsule 2  . tiZANidine (ZANAFLEX) 2 MG tablet  Take 2 mg by mouth 3 (three) times daily.     Marland Kitchen tolterodine (DETROL LA) 2 MG 24 hr capsule Take 1 capsule (2 mg total) by mouth daily. (Patient taking differently: Take 2 mg by mouth daily at 6 PM. ) 90 capsule 3  . tretinoin (RETIN-A) 0.025 % gel Apply 1 application topically at bedtime.    . triamcinolone cream (KENALOG) 0.1 % Apply 1 application topically daily.     . vitamin C (VITAMIN C) 250 MG tablet Take 1 tablet (250 mg total) by mouth daily. 30 tablet 0  . Zinc Oxide 40 % PSTE APPLY A THIN LAYER TOPICALLY TO BUTTOCKS AND SCROTUM TWICE DAILY *BEFIN AFTER COMPLETION OF CLOTRIMAZOLE OINTMENT* (Patient taking differently: Apply 1 application topically 2 (two) times daily. To scrotum and buttocks *Begin after completion of clotrimazole ointment*) 113 g 4    Objective: BP 93/62 (BP Location: Left Arm)   Pulse 99   Temp 99.6 F (37.6 C) (Rectal)   Resp 20   SpO2 97%  Exam: General: uncomfortable appearing older male, lying on right side, talkative when asked questions, no acute distress with nontoxic appearance   CV: regular rate and rhythm without murmurs, rubs, or gallops, no lower extremity edema Lungs: clear to auscultation bilaterally with normal work of breathing Abdomen: soft, very tender to right lower abdomen to palpation, nontender to remainder of abdominal exam, normoactive bowel sounds Skin: warm, dry, two small superficial skin wounds on left ankle/foot with overlying scabs without signs of infection, no incrrased warmth, one small superficial skin wound on right ankle without erythema or edema Extremities: warm and well perfused  Labs and Imaging: CBC BMET  Recent Labs  Lab  04/08/19 0922  WBC 11.7*  HGB 14.3  HCT 45.6  PLT 438*   Recent Labs  Lab 04/08/19 0922  NA 135  K 2.6*  CL 102  CO2 14*  BUN 34*  CREATININE 2.47*  GLUCOSE 152*  CALCIUM 10.4*     Ct Abdomen Pelvis W Contrast  Result Date: 04/06/2019 CLINICAL DATA:  Right-sided abdominal pain EXAM: CT ABDOMEN AND PELVIS WITH CONTRAST TECHNIQUE: Multidetector CT imaging of the abdomen and pelvis was performed using the standard protocol following bolus administration of intravenous contrast. CONTRAST:  172m OMNIPAQUE IOHEXOL 300 MG/ML  SOLN COMPARISON:  03/03/2018 FINDINGS: Lower chest: Right base collapse/consolidation with small right pleural effusion. Large hiatal hernia noted. Hepatobiliary: No suspicious focal abnormality within the liver parenchyma. Tiny gallstone evident. No gallbladder wall thickening or pericholecystic fluid. Trace periportal edema evident. No biliary dilatation. Pancreas: Diffuse atrophy of pancreatic parenchyma with prominence of the main pancreatic duct, similar to prior. Spleen: No splenomegaly. No focal mass lesion. Adrenals/Urinary Tract: No adrenal nodule or mass. Kidneys unremarkable. No evidence for hydroureter. Mild circumferential bladder wall thickening evident. Stomach/Bowel: Large hiatal hernia. Gastric wall thickening noted in the herniated segment of the stomach. Duodenum does not cross the midline suggesting intestinal malrotation. No small bowel dilatation to suggest obstruction. Right colon is fluid-filled and moderately distended without substantial wall thickening. Vascular/Lymphatic: No abdominal aortic aneurysm. Portal vein, superior mesenteric vein, and splenic vein are patent. There is no gastrohepatic or hepatoduodenal ligament lymphadenopathy. No intraperitoneal or retroperitoneal lymphadenopathy. No pelvic sidewall lymphadenopathy. Reproductive: The prostate gland and seminal vesicles are unremarkable. Other: No intraperitoneal free fluid. Musculoskeletal:  No worrisome lytic or sclerotic osseous abnormality. IMPRESSION: 1. No acute findings in the abdomen or pelvis. No findings to explain the patient's history of right abdominal pain. 2. Similar  appearance of large hiatal hernia with associated gastric wall thickening. Gastritis not excluded. 3. Mild circumferential bladder wall thickening, likely accentuated by underdistention. UTI not excluded. 4. Cholelithiasis 5. Right base collapse/consolidation with small right pleural effusion. Electronically Signed   By: Misty Stanley M.D.   On: 04/06/2019 14:56   Dg Chest Portable 1 View  Result Date: 04/08/2019 CLINICAL DATA:  Right pleural effusion on CT abdomen EXAM: PORTABLE CHEST 1 VIEW COMPARISON:  2017 FINDINGS: Suboptimal evaluation due to limitation on patient positioning. Hazy increased density of right lung likely reflects layering pleural effusion and adjacent atelectasis seen on CT. Left lung appears clear. Stable cardiomediastinal contours, which are not well evaluated due to positioning. Large hiatal hernia. IMPRESSION: Hazy increased density of right lung likely reflecting known pleural effusion and adjacent atelectasis. Electronically Signed   By: Macy Mis M.D.   On: 04/08/2019 11:07    [name] Hefty, DO 04/08/2019, 11:46 AM PGY-2, [place] Intern pager: [phone], text pages welcome

## 2019-04-08 NOTE — ED Notes (Signed)
Patient is resting comfortably. 

## 2019-04-08 NOTE — ED Triage Notes (Signed)
Pt is coming from [facility]. Pt having generalized belly pain and diarr. He was at [facility] for same issues 2 days ago.

## 2019-04-08 NOTE — ED Notes (Signed)
Pt had episode of watery diarr- cleaned and changed

## 2019-04-08 NOTE — ED Notes (Signed)
ED TO INPATIENT HANDOFF REPORT  ED Nurse Name and Phone #: Lenord CarboLlilbert, [phone]  S Name/Age/Gender [name] 58 y.o. male Room/Bed: 041C/041C  Code Status   Code Status: Full Code  Home/SNF/Other Home Patient oriented to: self, place, time and situation Is this baseline? Yes   Triage Complete: Triage complete  Chief Complaint Abd pain diarrea   Triage Note Pt is coming from [address] group home. Pt having generalized belly pain and diarr. [facility] days ago.    Allergies Allergies  Allergen Reactions  . Adhesive [Tape] Rash    Level of Care/Admitting Diagnosis ED Disposition    ED Disposition Condition Comment   Admit  [facility] [[id number]]  Level of Care: Telemetry Medical [104]  Covid Evaluation: Asymptomatic Screening Protocol (No Symptoms)  Diagnosis: Acute renal failure (ARF) [facility](HCC) [[id number]]  Admitting Physician: Joana ReamerMULLIS, KIERSTEN P [[id number]]  Attending Physician: Janit PaganENIOLA, KEHINDE T [[id number]]  Estimated length of stay: past midnight tomorrow  Certification:: I certify this patient will need inpatient services for at least 2 midnights  PT Class (Do Not Modify): Inpatient [101]  PT Acc Code (Do Not Modify): Private [1]       B Medical/Surgery History Past Medical History:  Diagnosis Date  . Anxiety    takes Ativan daily as needed  . Bipolar disorder (HCC)    takes Depakote daily  . CEREBRAL PALSY 07/24/2006  . Chronic bronchitis (HCC)    "gets it q yr"  . Constipation    takes Miralax daily as needed  . Depression    takes Risperdal nightly  . Environmental allergies    takes Zyrtec daily  . GASTRIC ULCER ACUTE WITH HEMORRHAGE 07/24/2006  . GERD (gastroesophageal reflux disease)    takes Pravacid daily  . History of blood transfusion    no abnormal reaction noted  . History of hiatal hernia   . History of shingles   . History of stomach ulcers ~ 1996  . Hypercholesterolemia   . Pneumonia  "quite a few times"   in 2015 and in June 2016  . Recurrent UTI (urinary tract infection)   . URINARY INCONTINENCE 02/09/2010   takes Detrol daily-occasionally incontinence  . Weakness    numbness in extremities   Past Surgical History:  Procedure Laterality Date  . AMPUTATION Right 12/14/2014   Procedure: AMPUTATION DIGIT RIGHT GREAT TOE MTP;  Surgeon: Nadara MustardMarcus Duda V, MD;  Location: [facility] OR;  Service: Orthopedics;  Laterality: Right;  . BIOPSY  08/12/2018   Procedure: BIOPSY;  Surgeon: Lynann BolognaGupta, Rajesh, MD;  [facility];  Service: Gastroenterology;;  . COLONOSCOPY WITH PROPOFOL N/A 08/14/2018   Procedure: COLONOSCOPY WITH PROPOFOL;  Surgeon: Lynann BolognaGupta, Rajesh, MD;  Location: [facility] ENDOSCOPY;  Service: Gastroenterology;  Laterality: N/A;  . ESOPHAGOGASTRODUODENOSCOPY (EGD) WITH PROPOFOL N/A 08/12/2018   Procedure: ESOPHAGOGASTRODUODENOSCOPY (EGD) WITH PROPOFOL;  Surgeon: Lynann BolognaGupta, Rajesh, MD;  Location: [facility] ENDOSCOPY;  Service: Gastroenterology;  Laterality: N/A;  . head surgery  as a baby   due to "water head"   . HERNIA REPAIR    . HIATAL HERNIA REPAIR  1982  . LEG SURGERY Bilateral ~ 1967   "legs were scissored; stretched them out"     A IV Location/Drains/Wounds Patient Lines/Drains/Airways Status   Active Line/Drains/Airways    Name:   Placement date:   Placement time:   Site:   Days:   Peripheral IV 04/08/19 Left Hand   04/08/19  1610    Hand   less than 1   External Urinary Catheter   08/13/18    2126    -   238   Pressure Injury 08/10/18 Stage II -  Partial thickness loss of dermis presenting as a shallow open ulcer with a red, pink wound bed without slough.   08/10/18    1300     241   Wound / Incision (Open or Dehisced)  Toe (Comment  which one) Right;Anterior Beefy red in appearance   -    0315    Toe (Comment  which one)      Wound / Incision (Open or Dehisced) 08/10/18 Leg Left wounds on leg, unable to view due to una boot applied at wound center   08/10/18    1300    Leg   241    Wound / Incision (Open or Dehisced) 08/10/18 Foot Right wound in dressing applied at wound center as an outpatient   08/10/18    1645    Foot   241          Intake/Output Last 24 hours  Intake/Output Summary (Last 24 hours) at 04/08/2019 1840 Last data filed at 04/08/2019 1124 Gross per 24 hour  Intake 1000 ml  Output -  Net 1000 ml    Labs/Imaging Results for orders placed or performed during the hospital encounter of 04/08/19 (from the past 48 hour(s))  CBC with Differential     Status: Abnormal   Collection Time: 04/08/19  9:22 AM  Result Value Ref Range   WBC 11.7 (H) 4.0 - 10.5 K/uL    Comment: WHITE COUNT CONFIRMED ON SMEAR   RBC 5.93 (H) 4.22 - 5.81 MIL/uL   Hemoglobin 14.3 13.0 - 17.0 g/dL   HCT 96.0 45.4 - 09.8 %   MCV 76.9 (L) 80.0 - 100.0 fL   MCH 24.1 (L) 26.0 - 34.0 pg   MCHC 31.4 30.0 - 36.0 g/dL   RDW 11.9 (H) 14.7 - 82.9 %   Platelets 438 (H) 150 - 400 K/uL   nRBC 0.0 0.0 - 0.2 %   Neutrophils Relative % 65 %   Neutro Abs 7.6 1.7 - 7.7 K/uL   Lymphocytes Relative 14 %   Lymphs Abs 1.6 0.7 - 4.0 K/uL   Monocytes Relative 19 %   Monocytes Absolute 2.2 (H) 0.1 - 1.0 K/uL   Eosinophils Relative 0 %   Eosinophils Absolute 0.0 0.0 - 0.5 K/uL   Basophils Relative 0 %   Basophils Absolute 0.0 0.0 - 0.1 K/uL   WBC Morphology See Note     Comment: Increased Bands. >20% Bands  Mild Left Shift. 1 to 5% Metas and Myelos, Occ Pro Noted.    Immature Granulocytes 2 %   Abs Immature Granulocytes 0.21 (H) 0.00 - 0.07 K/uL   Polychromasia PRESENT     Comment: Performed at [facility], [address] [place], [place] [address]  Lipase, blood     Status: None   Collection Time: 04/08/19  9:22 AM  Result Value Ref Range   Lipase 16 11 - 51 U/L    Comment: Performed at [facility], [address] [place], [place] [address]  Comprehensive metabolic panel     Status: Abnormal   Collection Time: 04/08/19  9:22 AM  Result Value Ref Range   Sodium 135  135 - 145 mmol/L   Potassium 2.6 (LL) 3.5 - 5.1 mmol/L    Comment:  CRITICAL RESULT CALLED TO, READ BACK BY AND VERIFIED WITH: ELIZABETH PREYER,RN AT 1050 04/08/2019 BY ZBEECH.    Chloride 102 98 - 111 mmol/L   CO2 14 (L) 22 - 32 mmol/L   Glucose, Bld 152 (H) 70 - 99 mg/dL   BUN 34 (H) 6 - 20 mg/dL   Creatinine, Ser 9.60 (H) 0.61 - 1.24 mg/dL   Calcium 45.4 (H) 8.9 - 10.3 mg/dL   Total Protein 8.0 6.5 - 8.1 g/dL   Albumin 3.2 (L) 3.5 - 5.0 g/dL   AST 22 15 - 41 U/L   ALT 17 0 - 44 U/L   Alkaline Phosphatase 154 (H) 38 - 126 U/L   Total Bilirubin 0.4 0.3 - 1.2 mg/dL   GFR calc non Af Amer 27 (L) >60 mL/min   GFR calc Af Amer 32 (L) >60 mL/min   Anion gap 19 (H) 5 - 15    Comment: Performed at [facility], [address], [place], [place] [address]  Lactic acid, plasma     Status: Abnormal   Collection Time: 04/08/19  9:24 AM  Result Value Ref Range   Lactic Acid, Venous 3.5 (HH) 0.5 - 1.9 mmol/L    Comment: CRITICAL RESULT CALLED TO, READ BACK BY AND VERIFIED WITH: ELIZABETH PREYER,RN AT 1040 04/08/2019 BY ZBEECH. Performed at [facility], [address] [place], [place] [address]   Lactic acid, plasma     Status: Abnormal   Collection Time: 04/08/19 11:24 AM  Result Value Ref Range   Lactic Acid, Venous 3.9 (HH) 0.5 - 1.9 mmol/L    Comment: CRITICAL VALUE NOTED.  VALUE IS CONSISTENT WITH PREVIOUSLY REPORTED AND CALLED VALUE. Performed at [facility], [address] [place], [place] [address]   Magnesium     Status: None   Collection Time: 04/08/19 11:27 AM  Result Value Ref Range   Magnesium 1.8 1.7 - 2.4 mg/dL    Comment: Performed at [facility], [address] [place], [place] [address]  Basic metabolic panel     Status: Abnormal   Collection Time: 04/08/19  2:08 PM  Result Value Ref Range   Sodium 137 135 - 145 mmol/L   Potassium 2.4 (LL) 3.5 - 5.1 mmol/L    Comment: CRITICAL RESULT CALLED TO, READ BACK BY AND VERIFIED  WITH: L.Shakiera Edelson,RN [id number] I.MANNING    Chloride 107 98 - 111 mmol/L   CO2 15 (L) 22 - 32 mmol/L   Glucose, Bld 124 (H) 70 - 99 mg/dL   BUN 31 (H) 6 - 20 mg/dL   Creatinine, Ser 9.52 (H) 0.61 - 1.24 mg/dL   Calcium 9.7 8.9 - 84.1 mg/dL   GFR calc non Af Amer 40 (L) >60 mL/min   GFR calc Af Amer 46 (L) >60 mL/min   Anion gap 15 5 - 15    Comment: Performed at [facility], [address], [place], [place] [address]  Troponin I (High Sensitivity)     Status: Abnormal   Collection Time: 04/08/19  2:08 PM  Result Value Ref Range   Troponin I (High Sensitivity) 50 (H) <18 ng/L    Comment: (NOTE) Elevated high sensitivity troponin I (hsTnI) values and significant  changes across serial measurements may suggest ACS but many other  chronic and acute conditions are known to elevate hsTnI results.  Refer to the Links section for chest pain algorithms and additional  guidance. Performed at [facility]  Lab, [address] [place], [place] [address]   Urinalysis, Routine w reflex microscopic     Status: Abnormal   Collection Time: 04/08/19  4:30 PM  Result Value Ref Range   Color, Urine YELLOW YELLOW   APPearance HAZY (A) CLEAR   Specific Gravity, Urine 1.026 1.005 - 1.030   pH 5.0 5.0 - 8.0   Glucose, UA NEGATIVE NEGATIVE mg/dL   Hgb urine dipstick NEGATIVE NEGATIVE   Bilirubin Urine NEGATIVE NEGATIVE   Ketones, ur NEGATIVE NEGATIVE mg/dL   Protein, ur 30 (A) NEGATIVE mg/dL   Nitrite NEGATIVE NEGATIVE   Leukocytes,Ua NEGATIVE NEGATIVE   RBC / HPF 0-5 0 - 5 RBC/hpf   WBC, UA 0-5 0 - 5 WBC/hpf   Bacteria, UA FEW (A) NONE SEEN   Squamous Epithelial / LPF 0-5 0 - 5   Mucus PRESENT    Hyaline Casts, UA PRESENT     Comment: Performed at [facility] [address], [place], [place] [address]  POC occult blood, ED     Status: None   Collection Time: 04/08/19  4:39 PM  Result Value Ref Range   Fecal Occult Bld NEGATIVE NEGATIVE   Dg Chest Portable 1  View  Result Date: 04/08/2019 CLINICAL DATA:  Right pleural effusion on CT abdomen EXAM: PORTABLE CHEST 1 VIEW COMPARISON:  2017 FINDINGS: Suboptimal evaluation due to limitation on patient positioning. Hazy increased density of right lung likely reflects layering pleural effusion and adjacent atelectasis seen on CT. Left lung appears clear. Stable cardiomediastinal contours, which are not well evaluated due to positioning. Large hiatal hernia. IMPRESSION: Hazy increased density of right lung likely reflecting known pleural effusion and adjacent atelectasis. Electronically Signed   By: Macy Mis M.D.   On: 04/08/2019 11:07    Pending Labs Unresulted Labs (From admission, onward)    Start     Ordered   04/09/19 9323  Basic metabolic panel  Tomorrow morning,   R     04/08/19 1315   04/09/19 0500  CBC  Tomorrow morning,   R     04/08/19 1315   04/08/19 5573  Basic metabolic panel  Once,   STAT     04/08/19 1703   04/08/19 1821  Lactic acid, plasma  Once,   STAT     04/08/19 1820   04/08/19 1337  Occult blood card to lab, stool  ONCE - STAT,   STAT     04/08/19 1336   04/08/19 1314  SARS CORONAVIRUS 2 (TAT 6-24 HRS)  Once,   R     04/08/19 1314   04/08/19 1313  Gastrointestinal Panel by PCR , Stool  (Gastrointestinal Panel by PCR, Stool                                                                                                                                                     *  Does Not include CLOSTRIDIUM DIFFICILE testing.**If CDIFF testing is needed, select the C Difficile Quick Screen w PCR reflex order below)  Once,   STAT     04/08/19 1315   04/08/19 1313  C Difficile Quick Screen w PCR reflex  (Gastrointestinal Panel by PCR, Stool                                                                                                                                                     *Does Not include CLOSTRIDIUM DIFFICILE testing.**If CDIFF testing is needed, select the C Difficile Quick  Screen w PCR reflex order below)  Once, for 24 hours,   STAT     04/08/19 1315   04/08/19 1044  Blood culture (routine x 2)  BLOOD CULTURE X 2,   STAT     04/08/19 1044   04/08/19 0937  Urine culture  ONCE - STAT,   STAT     04/08/19 0936          Vitals/Pain Today's Vitals   04/08/19 1708 04/08/19 1730 04/08/19 1745 04/08/19 1819  BP: (!) 91/54 98/61 123/69 (!) 87/61  Pulse: 82 87 85 78  Resp: 15 18 (!) 21 13  Temp:      TempSrc:      SpO2: 96% 96% 96% 98%  PainSc:        Isolation Precautions Enteric precautions (UV disinfection)  Medications Medications  risperiDONE (RISPERDAL) tablet 2 mg (has no administration in time range)  pantoprazole (PROTONIX) EC tablet 40 mg (40 mg Oral Given 04/08/19 1737)  tamsulosin (FLOMAX) capsule 0.4 mg (0.4 mg Oral Given 04/08/19 1737)  divalproex (DEPAKOTE SPRINKLE) capsule 500 mg (500 mg Oral Given 04/08/19 1737)  Zinc Oxide 40 % PSTE 1 application (1 application Topical Given 04/08/19 1738)  fesoterodine (TOVIAZ) tablet 4 mg (4 mg Oral Given 04/08/19 1738)  enoxaparin (LOVENOX) injection 40 mg (has no administration in time range)  oxyCODONE (Oxy IR/ROXICODONE) immediate release tablet 5 mg (has no administration in time range)  0.9 % NaCl with KCl 40 mEq / L  infusion (125 mL/hr Intravenous New Bag/Given 04/08/19 1642)  acetaminophen (TYLENOL) tablet 650 mg (has no administration in time range)    Or  acetaminophen (TYLENOL) suppository 650 mg (has no administration in time range)  sodium chloride 0.9 % bolus 1,000 mL (0 mLs Intravenous Stopped 04/08/19 1226)  dicyclomine (BENTYL) capsule 10 mg (10 mg Oral Given 04/08/19 1006)  ondansetron (ZOFRAN) injection 4 mg (4 mg Intravenous Given 04/08/19 1005)  morphine 4 MG/ML injection 4 mg (4 mg Intravenous Given 04/08/19 1005)  potassium chloride SA (KLOR-CON) CR tablet 40 mEq (40 mEq Oral Given 04/08/19 1121)  potassium chloride 10 mEq in 100 mL IVPB (0 mEq Intravenous Stopped 04/08/19  1348)  sodium chloride 0.9 % bolus 1,000 mL (0 mLs Intravenous  Stopped 04/08/19 1124)  sodium chloride 0.9 % bolus 1,000 mL (0 mLs Intravenous Stopped 04/08/19 1536)  potassium chloride SA (KLOR-CON) CR tablet 40 mEq (40 mEq Oral Given 04/08/19 1608)    Mobility non-ambulatory Low fall risk   Focused Assessments Gastric   R Recommendations: See Admitting Provider Note  Report given to:   Additional Notes: N/A

## 2019-04-09 DIAGNOSIS — E872 Acidosis, unspecified: Secondary | ICD-10-CM

## 2019-04-09 LAB — BASIC METABOLIC PANEL
Anion gap: 12 (ref 5–15)
BUN: 24 mg/dL — ABNORMAL HIGH (ref 6–20)
CO2: 16 mmol/L — ABNORMAL LOW (ref 22–32)
Calcium: 9.6 mg/dL (ref 8.9–10.3)
Chloride: 111 mmol/L (ref 98–111)
Creatinine, Ser: 0.87 mg/dL (ref 0.61–1.24)
GFR calc Af Amer: >60 mL/min (ref >60)
GFR calc non Af Amer: >60 mL/min (ref >60)
Glucose, Bld: 103 mg/dL — ABNORMAL HIGH (ref 70–99)
Potassium: 3.9 mmol/L (ref 3.5–5.1)
Sodium: 139 mmol/L (ref 135–145)

## 2019-04-09 LAB — GASTROINTESTINAL PANEL BY PCR, STOOL (REPLACES STOOL CULTURE)

## 2019-04-09 LAB — CBC
HCT: 36 % — ABNORMAL LOW (ref 39.0–52.0)
Hemoglobin: 11.5 g/dL — ABNORMAL LOW (ref 13.0–17.0)
MCH: 23.9 pg — ABNORMAL LOW (ref 26.0–34.0)
MCHC: 31.9 g/dL (ref 30.0–36.0)
MCV: 74.7 fL — ABNORMAL LOW (ref 80.0–100.0)
Platelets: 337 10*3/uL (ref 150–400)
RBC: 4.82 MIL/uL (ref 4.22–5.81)
RDW: 19.9 % — ABNORMAL HIGH (ref 11.5–15.5)
WBC: 9.2 10*3/uL (ref 4.0–10.5)
nRBC: 0.2 % (ref 0.0–0.2)

## 2019-04-09 LAB — LACTIC ACID, PLASMA
Lactic Acid, Venous: 2.7 mmol/L (ref 0.5–1.9)
Lactic Acid, Venous: 3.7 mmol/L (ref 0.5–1.9)

## 2019-04-09 LAB — MRSA PCR SCREENING: MRSA by PCR: POSITIVE — AB

## 2019-04-09 MED ORDER — CHLORHEXIDINE GLUCONATE CLOTH 2 % EX PADS
6.0000 | MEDICATED_PAD | Freq: Every day | CUTANEOUS | Status: DC
  Administered 2019-04-09 – 2019-04-12 (×4): 6 via TOPICAL

## 2019-04-09 MED ORDER — SODIUM CHLORIDE 0.9 % IV BOLUS
1000.0000 mL | Freq: Once | INTRAVENOUS | Status: AC
  Administered 2019-04-09: 1000 mL via INTRAVENOUS

## 2019-04-09 MED ORDER — MUPIROCIN 2 % EX OINT
1.0000 "application " | TOPICAL_OINTMENT | Freq: Two times a day (BID) | CUTANEOUS | Status: DC
  Administered 2019-04-09 – 2019-04-12 (×7): 1 via NASAL
  Filled 2019-04-09: qty 22

## 2019-04-09 NOTE — Plan of Care (Signed)
  Problem: Education: Goal: Knowledge of General Education information will improve Description Including pain rating scale, medication(s)/side effects and non-pharmacologic comfort measures Outcome: Progressing   

## 2019-04-09 NOTE — Progress Notes (Signed)
4982-  Bladder scan performed, showing 427. On call provider notified. No new orders given.   0629- Bladder scan performed, showing 536. On call provider notified. No new orders given.  Patient shows no sign of distress/ discomfort. Will continue to monitor.

## 2019-04-09 NOTE — Progress Notes (Signed)
Family Medicine Teaching Service Daily Progress Note Intern Pager: [phone]  Patient name: [name] Medical record number: [id number] Date of birth: [birth year] Age: 58 y.o. Gender: male  Primary Care Provider: [name], DO Consultants: None  Code Status: Full  Pt Overview and Major Events to Date:  11/11: admitted for dehydration, abdominal pain  Assessment and Plan: NOWELL SITES is a 58 y.o. male presenting from [facility] group home with persistent diarrhea and right lower abdominal pain. PMH is significant for cerebral palsy (quadriplegic), cholelithiasis, chronic large hiatal hernia, h/o DVT, h/o constipation, h/o pressure injuries, urinary incontinence.   Persistent Diarrhea  Right Lower Abdominal Pain  Lactic Acidosis: Overnight patient remained afebrile, VSS, and received a total of 4.5L of NS via bolus and has been on 125 mL/hr of NS for mIVF. LA downtrended from 3.9 to 2.7 overnight with hydration. FOBT negative, C.diff negative x2, GI panel negative from 11/10, blood and urine cultures negative on 11/10, pending urine and blood culture results of 11/12. Potassium repleted from 2.6 to 3.9 today. Patient is still having dull aching pain on R side of abdomen today. Stool output 800 mL overnight. Patient has complicated constipation regimen, appears he has been receiving miralax BID this week. Request MAR from group home and will assess medication usage. This does not appear to be infectious in nature, but more likely inadvertent administration of too much laxative. - 150m NS IVF  - AM CBC, BMP - f/u GI panel, blood culture, urine culture from 11/12 - follow up FOBT - Tylenol and Oxy IR '5mg'$  PRN - strict I/O's, daily weights - soft diet as tolerated  AKI  Oligouria: resolved Cr improved from 2.47 to 0.87. Repeat UA and culture pending.  S/p 4.5L NS bolus in the ED. UOP 700cc overnight. AKI and oliguria were likely secondary to persistent water  diarrhea and dehydration. - continue 128mNS  - avoid nephrotoxic agents - BMP in AM - strict I/O's  Hypokalemia: K 2.5 on admission, repleted to 3.9 this morning with EKG NSR. - AM BMP  - replete as needed  ST Depression in Anterior Leads  Prolonged QTc: ST depression in anterior leads on EKG yesterday, appears same today. Denies any chest pain. Manual calculation notable for Qtc of 456 yesterday, today manual QTc 431. Patient on Risperdal and Depakote for Cerebral palsy. Troponins trended down, stopped following. - continue to monitor  Chronic elevated Alkaline Phosphatase: Alk phos stable at 154. Unclear etiology.  - Recommend continued follow up outpatient.    Chronic DVT of femoral vein: Not on anticoagulation. No LE edema appreciated. -  Recommend outpatient follow up - Lovenox for DVT prophylaxis  Cerebral Palsy  Quadriplegia: Patient lives in group home. Home meds include: Risperdal and Depakote  - continue home meds  Chronic Pressure Ulcers: Located on ankles bilaterally. No signs of infection. Heel floaters.  - daily wound care - wound care consulted  Code status: Unable to contact sister/guardian Dianne Fulcher to address code status. Will make patient full code until otherwise indicated.  - Plan to attempt to contact sister to discuss  FEN/GI: Bland diet, MIVF Prophylaxis: Lovenox  Disposition: to med-surg pending stabilization of diarrhea  Subjective:  Pt feels better today overall but still has some dull, aching pain of R abdomen. So far infectious work up is negligible, most likely too much laxative. Group home has faxed MAR, will look for too much laxative. Report from caretaker yesterday said patient received miralax  BID even with diarrhea.  Objective: Temp:  [97.7 F (36.5 C)-99.6 F (37.6 C)] 98.6 F (37 C) (11/13 0320) Pulse Rate:  [78-104] 86 (11/13 0320) Resp:  [13-21] 18 (11/13 0320) BP: (87-123)/(54-79) 115/65 (11/13 0320) SpO2:   [96 %-100 %] 100 % (11/13 0320) Physical Exam: General: uncomfortable appearing older man, lying on left side, cooperative with exam, NAD Cardiovascular: RRR, no m/r/g Respiratory: CTAB Abdomen: soft, mildly tender RLQ to palpation, ND, no guarding  Extremities: warm and well perfused  Laboratory: Recent Labs  Lab 04/06/19 1114 04/08/19 0922 04/09/19 0307  WBC 10.0 11.7* 9.2  HGB 12.8* 14.3 11.5*  HCT 41.6 45.6 36.0*  PLT 370 438* 337   Recent Labs  Lab 04/06/19 1114 04/08/19 0922 04/08/19 1408 04/08/19 2047 04/09/19 0307  NA 138 135 137 138 139  K 3.2* 2.6* 2.4* 3.7 3.9  CL 104 102 107 110 111  CO2 24 14* 15* 14* 16*  BUN 18 34* 31* 26* 24*  CREATININE 0.58* 2.47* 1.83* 1.17 0.87  CALCIUM 9.7 10.4* 9.7 9.8 9.6  PROT 7.8 8.0  --   --   --   BILITOT 0.7 0.4  --   --   --   ALKPHOS 154* 154*  --   --   --   ALT 14 17  --   --   --   AST 13* 22  --   --   --   GLUCOSE 106* 152* 124* 131* 103*   EKG: NSR, QTc manual 431  Imaging/Diagnostic Tests: No results found.   Gladys Damme, MD 04/09/2019, 8:02 AM PGY-1, [place] Intern pager: [phone], text pages welcome

## 2019-04-09 NOTE — Progress Notes (Signed)
On call provider Volanda Napoleon, MD made aware of lactic 3.5 @ 2047. Provider place order for 500 mL bolus.   Lactic rechecked @ 0307 was 2.7. On call provider notified. Placed order for 1000 mL bolus and recheck of Lactic in 6 hours.

## 2019-04-10 LAB — COMPREHENSIVE METABOLIC PANEL
ALT: 17 U/L (ref 0–44)
AST: 14 U/L — ABNORMAL LOW (ref 15–41)
Albumin: 2.5 g/dL — ABNORMAL LOW (ref 3.5–5.0)
Alkaline Phosphatase: 111 U/L (ref 38–126)
Anion gap: 10 (ref 5–15)
BUN: 14 mg/dL (ref 6–20)
CO2: 16 mmol/L — ABNORMAL LOW (ref 22–32)
Calcium: 9.7 mg/dL (ref 8.9–10.3)
Chloride: 114 mmol/L — ABNORMAL HIGH (ref 98–111)
Creatinine, Ser: 0.58 mg/dL — ABNORMAL LOW (ref 0.61–1.24)
GFR calc Af Amer: >60 mL/min (ref >60)
GFR calc non Af Amer: >60 mL/min (ref >60)
Glucose, Bld: 106 mg/dL — ABNORMAL HIGH (ref 70–99)
Potassium: 3.8 mmol/L (ref 3.5–5.1)
Sodium: 140 mmol/L (ref 135–145)
Total Bilirubin: 0.5 mg/dL (ref 0.3–1.2)
Total Protein: 6.2 g/dL — ABNORMAL LOW (ref 6.5–8.1)

## 2019-04-10 LAB — CBC
HCT: 35.4 % — ABNORMAL LOW (ref 39.0–52.0)
Hemoglobin: 11.4 g/dL — ABNORMAL LOW (ref 13.0–17.0)
MCH: 24.3 pg — ABNORMAL LOW (ref 26.0–34.0)
MCHC: 32.2 g/dL (ref 30.0–36.0)
MCV: 75.3 fL — ABNORMAL LOW (ref 80.0–100.0)
Platelets: 343 10*3/uL (ref 150–400)
RBC: 4.7 MIL/uL (ref 4.22–5.81)
RDW: 20.3 % — ABNORMAL HIGH (ref 11.5–15.5)
WBC: 13.7 10*3/uL — ABNORMAL HIGH (ref 4.0–10.5)
nRBC: 0.4 % — ABNORMAL HIGH (ref 0.0–0.2)

## 2019-04-10 LAB — LACTIC ACID, PLASMA: Lactic Acid, Venous: 1.7 mmol/L (ref 0.5–1.9)

## 2019-04-10 LAB — URINE CULTURE: Culture: NO GROWTH

## 2019-04-10 NOTE — Progress Notes (Signed)
Family Medicine Teaching Service Daily Progress Note Intern Pager: [phone]  Patient name: [name] Medical record number: [id number] Date of birth: [birth year] Age: 58 y.o. Gender: male  Primary Care Provider: [name], DO Consultants: None  Code Status: Full  Pt Overview and Major Events to Date:  11/11: admitted for dehydration, abdominal pain  Assessment and Plan: [name] is a 58 y.o. male presenting from [facility] group home with persistent diarrhea and right lower abdominal pain. PMH is significant for cerebral palsy (quadriplegic), cholelithiasis, chronic large hiatal hernia, h/o DVT, h/o constipation, h/o pressure injuries, urinary incontinence.   Persistent Diarrhea  Right Lower Abdominal Pain  Lactic Acidosis: Diarrhea seems to have slowed down somewhat over the last 18 hours with only 300 mL charted, although I suspect it is somewhat higher than this.  Patient's abdominal pain has improved as well.  He will endorses minimal tenderness in his midline epigastric area.  +500 mL last 24 hours.  Last lactic acidosis 1.7, this has resolved.  Patient's stool output has been discolored and very foul-smelling.  We will switch to gluten-free diet and run celiac panel to better evaluate for celiac disease.  Was ever treated with laxatives at living facility.  We will continue to hold those. -Normal saline with KCl 40 mEq at 125 mL/h  -A.m. CBC, BMP -Tylenol for pain control -Discontinue oxycodone - strict I/O's, daily weights -Switch to gluten-free diet  AKI  Oligouria: resolved Creatinine at 0.6 from 0.8.  Likely prerenal in etiology secondary to dehydration.  We will continue maintenance fluids for now. -Continue 125 mL normal saline with 40 mEq K CL - avoid nephrotoxic agents - BMP in AM - strict I/O's  Hypokalemia (resolved) K3.8 this morning.  Repleted with KCl mixed in the IV fluids. - AM BMP  - replete as needed  ST Depression  in Anterior Leads  Prolonged QTc: EKG from 11/13 showing nonspecific T wave abnormality consistent with chronic ST depression.  QTC 431.  Can continue to follow.  Takes Risperdal and Depakote for cerebral palsy. - continue to monitor  Chronic elevated Alkaline Phosphatase: Alk phos stable at 154. Unclear etiology.  - Recommend continued follow up outpatient.    Chronic DVT of femoral vein: Not on anticoagulation. No LE edema appreciated. -  Recommend outpatient follow up  - Lovenox for DVT prophylaxis  Cerebral Palsy  Quadriplegia: Patient lives in group home. Home meds include: Risperdal and Depakote  - continue home meds  Chronic Pressure Ulcers: Located on ankles bilaterally. No signs of infection. Heel floaters.  - daily wound care - wound care consulted  Code status: Full code, can readdress an outpatient  FEN/GI:  Gluten-free, MIVF Prophylaxis: Lovenox  Disposition: Transfer to [place], [place] telemetry  Subjective:  Doing well this morning, no acute distress.  Sources good appetite.  Asking for someone to come and clean him.  Objective: Temp:  [98.5 F (36.9 C)-99.4 F (37.4 C)] 99.4 F (37.4 C) (11/14 1109) Pulse Rate:  [89] 89 (11/14 1109) Resp:  [18-20] 20 (11/14 1109) BP: (114-130)/(60-64) 114/64 (11/14 1109) SpO2:  [97 %-98 %] 97 % (11/14 1109) Physical Exam: General: 58 year old Caucasian male, no acute distress, with laying on left side, able speak in full sentences Cardiovascular: Regular rate and rhythm, no M/R/G Respiratory: Lungs clear to auscultation bilaterally, no accessory muscle use Abdomen: Soft, only tender to deep palpation mid epigastric area, no distention, rectal tube in place  Extremities: warm and well perfused  Laboratory: Recent Labs  Lab 04/08/19 0922 04/09/19 0307 04/10/19 0000  WBC 11.7* 9.2 13.7*  HGB 14.3 11.5* 11.4*  HCT 45.6 36.0* 35.4*  PLT 438* 337 343   Recent Labs  Lab 04/06/19 1114 04/08/19 0922   04/08/19 2047 04/09/19 0307 04/10/19 0000  NA 138 135   < > 138 139 140  K 3.2* 2.6*   < > 3.7 3.9 3.8  CL 104 102   < > 110 111 114*  CO2 24 14*   < > 14* 16* 16*  BUN 18 34*   < > 26* 24* 14  CREATININE 0.58* 2.47*   < > 1.17 0.87 0.58*  CALCIUM 9.7 10.4*   < > 9.8 9.6 9.7  PROT 7.8 8.0  --   --   --  6.2*  BILITOT 0.7 0.4  --   --   --  0.5  ALKPHOS 154* 154*  --   --   --  111  ALT 14 17  --   --   --  17  AST 13* 22  --   --   --  14*  GLUCOSE 106* 152*   < > 131* 103* 106*   < > = values in this interval not displayed.   EKG: NSR, QTc manual 431  Imaging/Diagnostic Tests: No results found.   Guadalupe Dawn, MD 04/10/2019, 12:23 PM PGY-3, [place] Intern pager: [phone], text pages welcome

## 2019-04-10 NOTE — Progress Notes (Signed)
If needed Please page [phone] to contact family medicine service in regards to this patient  Jory Welke MD PGY-3 Family Medicine Resident 

## 2019-04-11 DIAGNOSIS — R339 Retention of urine, unspecified: Secondary | ICD-10-CM

## 2019-04-11 LAB — BASIC METABOLIC PANEL
Anion gap: 10 (ref 5–15)
BUN: 12 mg/dL (ref 6–20)
CO2: 19 mmol/L — ABNORMAL LOW (ref 22–32)
Calcium: 9.3 mg/dL (ref 8.9–10.3)
Chloride: 110 mmol/L (ref 98–111)
Creatinine, Ser: 0.4 mg/dL — ABNORMAL LOW (ref 0.61–1.24)
GFR calc Af Amer: >60 mL/min (ref >60)
GFR calc non Af Amer: >60 mL/min (ref >60)
Glucose, Bld: 97 mg/dL (ref 70–99)
Potassium: 3.6 mmol/L (ref 3.5–5.1)
Sodium: 139 mmol/L (ref 135–145)

## 2019-04-11 LAB — CULTURE, BLOOD (ROUTINE X 2)
Culture: NO GROWTH
Special Requests: ADEQUATE

## 2019-04-11 LAB — CBC WITH DIFFERENTIAL/PLATELET
Abs Immature Granulocytes: 2.7 10*3/uL — ABNORMAL HIGH (ref 0.00–0.07)
Basophils Absolute: 0.1 10*3/uL (ref 0.0–0.1)
Basophils Relative: 0 %
Eosinophils Absolute: 0.3 10*3/uL (ref 0.0–0.5)
Eosinophils Relative: 2 %
HCT: 32.9 % — ABNORMAL LOW (ref 39.0–52.0)
Hemoglobin: 10.8 g/dL — ABNORMAL LOW (ref 13.0–17.0)
Immature Granulocytes: 17 %
Lymphocytes Relative: 20 %
Lymphs Abs: 3.1 10*3/uL (ref 0.7–4.0)
MCH: 24.3 pg — ABNORMAL LOW (ref 26.0–34.0)
MCHC: 32.8 g/dL (ref 30.0–36.0)
MCV: 73.9 fL — ABNORMAL LOW (ref 80.0–100.0)
Monocytes Absolute: 1.5 10*3/uL — ABNORMAL HIGH (ref 0.1–1.0)
Monocytes Relative: 10 %
Neutro Abs: 7.9 10*3/uL — ABNORMAL HIGH (ref 1.7–7.7)
Neutrophils Relative %: 51 %
Platelets: 307 10*3/uL (ref 150–400)
RBC: 4.45 MIL/uL (ref 4.22–5.81)
RDW: 19.9 % — ABNORMAL HIGH (ref 11.5–15.5)
WBC: 15.5 10*3/uL — ABNORMAL HIGH (ref 4.0–10.5)
nRBC: 0.3 % — ABNORMAL HIGH (ref 0.0–0.2)

## 2019-04-11 MED ORDER — BACLOFEN 10 MG PO TABS
20.0000 mg | ORAL_TABLET | Freq: Three times a day (TID) | ORAL | Status: DC
  Administered 2019-04-11 – 2019-04-12 (×4): 20 mg via ORAL
  Filled 2019-04-11 (×4): qty 2

## 2019-04-11 MED ORDER — TIZANIDINE HCL 2 MG PO TABS
2.0000 mg | ORAL_TABLET | Freq: Three times a day (TID) | ORAL | Status: DC
  Administered 2019-04-11 – 2019-04-12 (×4): 2 mg via ORAL
  Filled 2019-04-11 (×4): qty 1

## 2019-04-11 MED ORDER — SODIUM CHLORIDE 0.9 % IV SOLN
INTRAVENOUS | Status: DC
  Administered 2019-04-11: 10:00:00 via INTRAVENOUS

## 2019-04-11 MED ORDER — POTASSIUM CHLORIDE 20 MEQ/15ML (10%) PO SOLN
20.0000 meq | Freq: Two times a day (BID) | ORAL | Status: DC
  Administered 2019-04-11 – 2019-04-12 (×3): 20 meq via ORAL
  Filled 2019-04-11 (×3): qty 15

## 2019-04-11 NOTE — Discharge Summary (Addendum)
Family Medicine Teaching [facility] Discharge Summary  Patient name: [name] Medical record number: [id number] Date of birth: [birth year] Age: 58 y.o. 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 Cholelithiasis Chronic large hiatal hernia, slipped nissen's fundoplication H/o L femoral DVT H/o Pressure injuries  H/o pressure injuries Urinary incontinence  Disposition: to [facility]  Discharge Condition: Stable and Improved  Discharge Exam:  Physical Exam: General: older Caucasian male, no acute distress, with laying on left side, able speak in full sentences Cardiovascular: Regular rate and rhythm, no M/R/G Respiratory: Lungs clear to auscultation bilaterally, no accessory muscle use Abdomen: Soft, non tender, no distention, rectal tube in place Skin: Stage 1 pressure ulcer on L side of sacrum, no erythema, no edema, skin intact. Extremities: warm and well perfused.  LLE: +1 pedal edema in dosum of L foot, no erythema, pain, no edema present above level of malleolus. Erosion present on anteromedial aspect of L ankle, no erythema, edema, or discharge. RLE: no edema, 1 erosion present on dorsolateral aspect of R foot, no erythema, edema, or discharge present  Brief Hospital Course:  [name] was admitted on 04/08/2019 for persistent diarrhea, dehydration, oliguria, hypokalemia, and AKI after having 2 days of diarrhea. On speaking with caretakers at group home, patient was given multiple laxatives for constipation, and continued to receive them once he started having diarrhea. On 11/9 pt received colace BID, docusate BID, and miralax BID. On 11/10 and 11/11  (after presenting to ED on 11/10 for diarrhea), pt received colace BID and miralax BID. Consequently, he represented to the ED on 11/12 with the same symptoms including RLQ abdominal pain. C. Difficile testing negative x2, GI panel negative x2, blood culture negative x2, urine culture negative x2. Patient was afebrile, VSS, likely dehydration and diarrhea secondary to over treatment of laxatives.  On admission, pt had been oliguric and had  Cr elevated to 2.47, baseline about 0.6; K low at 2.5. Lactic acid elevated to 3.5. After significant bolus of <5L rehydration, patient began making appropriate urine output, cr improved to 0.4, and LA trended down to 1.7. Potassium repleted.   Patient had foul smelling stool, C.Diff negative x2. Potentially malabsorptive gastrointestinal disorder. Celiac panel sent, patient placed on gluten free diet.  Issues for Follow Up:  1. Careful administration of laxatives. Recommend not having standing laxative orders, especially more than one, as this episode was likely iatrogenically induced. Only sent home on miralax and careful instructions to titrate up from once daily to twice daily to achieve 1 stool per day. If daily dosing of miralax does not work, instructed care givers to seek medical advice. 2. Wound care: recommend having wound care follow stage 1 pressure ulcer on L sacrum, erosion on anteromedial L ankle, and erosion on R dorsolateral foot. Recommend frequent repositioning to avoid pressure ulcers. 3. Potential Malabsorptive digestive process: f/u celiac panel, on gluten free diet.  Significant Procedures: none  Significant Labs and Imaging:  Recent Labs  Lab 04/10/19 0000 04/11/19 0519 04/12/19 0552  WBC 13.7* 15.5* 16.9*  HGB 11.4* 10.8* 10.5*  HCT 35.4* 32.9* 31.7*  PLT 343 307 286   Recent Labs  Lab 04/06/19 1114 04/08/19 0922 04/08/19 1127  04/08/19 2047 04/09/19 0307 04/10/19 0000 04/11/19 0519 04/12/19  0552  NA 138 135  --    < >  138 139 140 139 136  K 3.2* 2.6*  --    < > 3.7 3.9 3.8 3.6 3.8  CL 104 102  --    < > 110 111 114* 110 103  CO2 24 14*  --    < > 14* 16* 16* 19* 22  GLUCOSE 106* 152*  --    < > 131* 103* 106* 97 96  BUN 18 34*  --    < > 26* 24* CREATININE 0.58* 2.47*  --    < > 1.17 0.87 0.58* 0.40* 0.51*  CALCIUM 9.7 10.4*  --    < > 9.8 9.6 9.7 9.3 8.8*  MG  --   --  1.8  --   --   --   --   --   --   ALKPHOS 154* 154*  --   --   --   --  111  --   --   AST 13* 22  --   --   --   --  14*  --   --   ALT 14 17  --   --   --   --  17  --   --   ALBUMIN 3.6 3.2*  --   --   --   --  2.5*  --   --    < > = values in this interval not displayed.    Results/Tests Pending at Time of Discharge:  none  Discharge Medications:  Allergies as of 04/12/2019      Reactions   Adhesive [tape] Rash      Medication List    STOP taking these medications   bisacodyl 5 MG EC tablet Commonly known as: DULCOLAX   docusate sodium 100 MG capsule Commonly known as: Colace   guaifenesin 100 MG/5ML syrup Commonly known as: ROBITUSSIN   loperamide 2 MG capsule Commonly known as: IMODIUM   magnesium hydroxide 400 MG/5ML suspension Commonly known as: MILK OF MAGNESIA   triamcinolone cream 0.1 % Commonly known as: KENALOG     TAKE these medications   acetaminophen 500 MG tablet Commonly known as: TYLENOL Take 1,000 mg by mouth every 6 (six) hours as needed for moderate pain.   ascorbic acid 250 MG tablet Commonly known as: VITAMIN C Take 1 tablet (250 mg total) by mouth daily.   baclofen 20 MG tablet Commonly known as: LIORESAL Take 1 tablet (20 mg total) by mouth 3 (three) times daily.   brompheniramine-pseudoephedrine 1-15 MG/5ML Elix Commonly known as: DIMETAPP Take 10 mLs by mouth 2 (two) times daily as needed (cold symptoms). Use as directed as needed for cold symptoms per standing orders   cetirizine 10 MG tablet Commonly known as: ZYRTEC Take 1 tablet (10 mg total) by mouth  daily. What changed: when to take this   clotrimazole 1 % cream Commonly known as: LOTRIMIN Apply 1 application topically 2 (two) times daily.   diclofenac sodium 1 % Gel Commonly known as: VOLTAREN Apply 4 g topically 4 (four) times daily.   divalproex 125 MG capsule Commonly known as: DEPAKOTE SPRINKLE Take 500 mg by mouth 2 (two) times daily.   Ear Drops Earwax Aid 6.5 % OTIC solution Generic drug: carbamide peroxide PLACE 5 DROPS INTO THE LEFT EAR TWICE DAILY What changed: See the new instructions.   guaiFENesin-dextromethorphan 100-10 MG/5ML syrup Commonly known as: ROBITUSSIN DM  Take 5 mLs by mouth every 4 (four) hours as needed for cough.   hydrocortisone cream 1 % APPLY TOPICALLY TO FACE AT BEDTIME What changed: See the new instructions.   Jobst Anti-Em Knee High Med Misc by Does not apply route. Apply once in the morning and remove at bedtime daily   ketoconazole 2 % shampoo Commonly known as: NIZORAL APPLY TOPICALLY TO AFFECTED AREA(S) TWICE WEEKLY AS DIRECTED What changed: See the new instructions.   LORazepam 2 MG tablet Commonly known as: ATIVAN Take 1 mg by mouth every 4 (four) hours as needed for anxiety.   Mylanta 200-200-20 MG/5ML suspension Generic drug: alum & mag hydroxide-simeth Take 30 mLs by mouth every 6 (six) hours as needed for indigestion or heartburn. Use as directed as needed for nausea/indigestion per standing order   omeprazole 40 MG capsule Commonly known as: PRILOSEC Take 1 capsule (40 mg total) by mouth 2 (two) times daily.   oyster calcium 500 MG Tabs tablet Take 1,500 mg of elemental calcium by mouth daily.   polyethylene glycol 17 g packet Commonly known as: MiraLax Take 17g by mouth daily as needed. Please do not give if having soft, regular bowel movements. What changed:   how much to take  how to take this  when to take this  additional instructions   Prevalon Heel Protector Misc 1 Device by Does not apply route  daily.   risperiDONE 2 MG tablet Commonly known as: RISPERDAL Take 2 mg by mouth at bedtime.   tamsulosin 0.4 MG Caps capsule Commonly known as: FLOMAX TAKE 1 CAPSULE BY MOUTH EVERY DAY *DO NOT CRUSH* What changed: See the new instructions.   tiZANidine 2 MG tablet Commonly known as: ZANAFLEX Take 2 mg by mouth 3 (three) times daily.   tolterodine 2 MG 24 hr capsule Commonly known as: Detrol LA Take 1 capsule (2 mg total) by mouth daily. What changed: when to take this   tretinoin 0.025 % gel Commonly known as: RETIN-A Apply 1 application topically at bedtime.   TRIPLE ANTIBIOTIC EX Apply 1 application topically as needed (cuts/scrapes/scratches).   Vitamin D3 125 MCG (5000 UT) Caps Take 5,000 Units by mouth daily.   Zinc Oxide 40 % Pste APPLY A THIN LAYER TOPICALLY TO BUTTOCKS AND SCROTUM TWICE DAILY *BEFIN AFTER COMPLETION OF CLOTRIMAZOLE OINTMENT* What changed: See the new instructions.            Durable Medical Equipment  (From admission, onward)         Start     Ordered   04/12/19 1159  For home use only DME Other see comment  Once    Comments: 1 pack of chucks/urinary incontinence pads  Question:  Length of Need  Answer:  Lifetime   04/12/19 1158          Discharge Instructions: Please refer to Patient Instructions section of EMR for full details.  Patient was counseled important signs and symptoms that should prompt return to medical care, changes in medications, dietary instructions, activity restrictions, and follow up appointments.   Follow-Up Appointments: Follow-up Information    Hensel, Santiago BumpersWilliam A, MD. Go on 04/14/2019.   Specialty: Family Medicine Why: 3:30 PM appointment Contact information: [address] [place] [address] [phone]        Allen DerryStone, Hoyt WorthEday III, PA-C. Go on 04/28/2019.   Specialty: Physician Assistant Why: 04/28/2019 9:45 AM Contact information: [address] [place]  [id number] [phone]  Gladys Damme, MD 04/12/2019, 2:24 PM PGY-1, [place] ----------------------------------------------------------------------------------------------- Upper Level Addendum: I have evaluated this patient along with Dr. Chauncey Reading and reviewed the above note, making necessary revisions  Guadalupe Dawn MD PGY-3 Family Medicine Resident

## 2019-04-11 NOTE — Plan of Care (Signed)
  Problem: Clinical Measurements: Goal: Ability to maintain clinical measurements within normal limits will improve Outcome: Progressing   

## 2019-04-11 NOTE — Progress Notes (Signed)
Family Medicine Teaching Service Daily Progress Note Intern Pager: [phone]  Patient name: [name] Medical record number: [id number] Date of birth: August 18, 1960 Age: 58 y.o. Gender: male  Primary Care Provider: [name], DO Consultants: None  Code Status: Full  Pt Overview and Major Events to Date:  11/11: admitted for dehydration, abdominal pain 11/14: GI panel negative x2, Blood culture negative x2, C. Diff negative x2, Urine culture negative x2  Assessment and Plan: [name] is a 58 y.o. male presenting from [facility] group home with persistent diarrhea and right lower abdominal pain. PMH is significant for cerebral palsy (quadriplegic), cholelithiasis, chronic large hiatal hernia, h/o DVT, h/o constipation, h/o pressure injuries, urinary incontinence.   Persistent Diarrhea  Right Lower Abdominal Pain: Diarrhea appears to have maintained at same rate as yesterday, 400 mL charted yesterday, 100 mL so far today.  Patient's stool output continues to be discolored and very foul-smelling.  Gluten free diet order in place, celiac labs collected, will follow up results. K+ 3.6 today. Appears patient was over-treated with laxatives at group home, laxatives continue to be held. Will remove rectal tube later today if minimal stool output continues. -Normal saline at 125 mL/h  -K-Dur packet for potassium repletion -A.M. CBC, BMP -Tylenol for pain control - Strict I/O's, daily weights -Gluten-free diet  Leukocytosis: WBCs up to 15.5 from 9.2 on 11/13. Patient is afebrile, VSS, no evidence of infection in sacral ulcer (stage 1, no skin break down), or wounds on feet (see below). Blood cultures negative x2, urine culture negative, C. Diff negative x2, GI panel negative x2. No obvious source of infection, or other signs of infection other than leukocytosis. Patient is not on steroids, does not use albuterol inhaler. - Will continue to monitor  AKI   Oligouria: resolved Creatinine at 0.4 from 0.6.  Likely prerenal in etiology secondary to dehydration.  UOP 1700 cc yesterday, likely ATN recovery diuresis. Pt had urinary retention per bladder scan last night, needed I/O cath. Patient has neurogenic bladder and receives I/O caths at group home occasionally. Patient appears to be recovering from AKI, able to tolerate PO, will encourage PO fluids today and decrease IVF to 50 mL/hr. -Start 50 mL normal saline IVF - avoid nephrotoxic agents - BMP in AM - Bladder scan qshift - strict I/O's  Hypokalemia (resolved) K3.6 this morning.  Repleted with oral potassium packets. - AM BMP  - replete as needed  ST Depression in Anterior Leads  Prolonged QTc: EKG from 11/13 showing nonspecific T wave abnormality consistent with chronic ST depression.  QTC 431.  Can continue to follow.  Takes Risperdal and Depakote for cerebral palsy. - continue to monitor  Chronic elevated Alkaline Phosphatase: Alk phos stable at 154. Unclear etiology.  - Recommend continued follow up outpatient.    Chronic DVT of femoral vein: Not on anticoagulation. Today pt has +1 pitting edema only in dorsum of L foot, no edema above malleolus. No erythema, pain, pt has no complaints. VSS. -  Will consider vas US doppler of LE - Lovenox for DVT prophylaxis  Cerebral Palsy  Quadriplegia: Patient lives in group home. Home meds include: Risperdal and Depakote  - continue home meds  Chronic Pressure Ulcers: Located on L ankle, R lateral foot. No signs of infection. Heel floaters.  - daily wound care - wound care consulted  Code status: Full code, can readdress an outpatient  FEN/GI:  Gluten-free, MIVF Prophylaxis: Lovenox  Disposition: Transfer to [place], [place]  telemetry  Subjective:  Doing well this morning, no acute distress. Abdominal pain has improved, now minimal. He has no particular complaints. Sacral decubitus ulcer is stage 1, no signs of infection,  helped nurse turn pt to R side. Pt has two ulcers on is feet: 1 on L medial ankle with xeroform dressing, no signs of infection; 1 on R dorsolateral foot, no signs of infection. No other skin breakdown, signs of infection on skin. Pt remains afebrile, VSS. Patient has +1 pedal edema only in dorsum of L foot, known chronic DVT of L femoral vein, on lovenox ppx.  Objective: Temp:  [98 F (36.7 C)-99.4 F (37.4 C)] 98.3 F (36.8 C) (11/15 0416) Pulse Rate:  [89-102] 92 (11/15 0416) Resp:  [16-20] 16 (11/15 0416) BP: (113-117)/(60-67) 117/60 (11/15 0416) SpO2:  [96 %-97 %] 97 % (11/15 0416) Weight:  [70.3 kg] 70.3 kg (11/15 0416) Physical Exam: General: older Caucasian male, no acute distress, with laying on left side, able speak in full sentences Cardiovascular: Regular rate and rhythm, no M/R/G Respiratory: Lungs clear to auscultation bilaterally, no accessory muscle use Abdomen: Soft, non tender, no distention, rectal tube in place Skin: Stage 1 pressure ulcer on L side of sacrum, no erythema, no edema, skin intact. Extremities: warm and well perfused.  LLE: +1 pedal edema in dosum of L foot, no erythema, pain, no edema present above level of malleolus. Erosion present on anteromedial aspect of L ankle, no erythema, edema, or discharge. RLE: no edema, 1 erosion present on dorsolateral aspect of R foot, no erythema, edema, or discharge present  Laboratory: Recent Labs  Lab 04/09/19 0307 04/10/19 0000 04/11/19 0519  WBC 9.2 13.7* 15.5*  HGB 11.5* 11.4* 10.8*  HCT 36.0* 35.4* 32.9*  PLT 337 343 307   Recent Labs  Lab 04/06/19 1114 04/08/19 0922  04/09/19 0307 04/10/19 0000 04/11/19 0519  NA 138 135   < > 139 140 139  K 3.2* 2.6*   < > 3.9 3.8 3.6  CL 104 102   < > 111 114* 110  CO2 24 14*   < > 16* 16* 19*  BUN 18 34*   < > 24* 14 12  CREATININE 0.58* 2.47*   < > 0.87 0.58* 0.40*  CALCIUM 9.7 10.4*   < > 9.6 9.7 9.3  PROT 7.8 8.0  --   --  6.2*  --   BILITOT 0.7 0.4  --    --  0.5  --   ALKPHOS 154* 154*  --   --  111  --   ALT 14 17  --   --  17  --   AST 13* 22  --   --  14*  --   GLUCOSE 106* 152*   < > 103* 106* 97   < > = values in this interval not displayed.   EKG: NSR, QTc manual 431  Imaging/Diagnostic Tests: No results found.   Gladys Damme, MD 04/11/2019, 8:02 AM PGY-1, [place] Intern pager: [phone], text pages welcome

## 2019-04-11 NOTE — Progress Notes (Signed)
Bladder scan showed 750cc urine. Did an IN and Out. Was able to drain 650cc of urine.Marland Kitchen

## 2019-04-12 ENCOUNTER — Ambulatory Visit: Payer: Medicare Other | Admitting: Podiatry

## 2019-04-12 LAB — BASIC METABOLIC PANEL
Anion gap: 11 (ref 5–15)
BUN: 13 mg/dL (ref 6–20)
CO2: 22 mmol/L (ref 22–32)
Calcium: 8.8 mg/dL — ABNORMAL LOW (ref 8.9–10.3)
Chloride: 103 mmol/L (ref 98–111)
Creatinine, Ser: 0.51 mg/dL — ABNORMAL LOW (ref 0.61–1.24)
GFR calc Af Amer: >60 mL/min (ref >60)
GFR calc non Af Amer: >60 mL/min (ref >60)
Glucose, Bld: 96 mg/dL (ref 70–99)
Potassium: 3.8 mmol/L (ref 3.5–5.1)
Sodium: 136 mmol/L (ref 135–145)

## 2019-04-12 LAB — CBC
HCT: 31.7 % — ABNORMAL LOW (ref 39.0–52.0)
Hemoglobin: 10.5 g/dL — ABNORMAL LOW (ref 13.0–17.0)
MCH: 24.2 pg — ABNORMAL LOW (ref 26.0–34.0)
MCHC: 33.1 g/dL (ref 30.0–36.0)
MCV: 73.2 fL — ABNORMAL LOW (ref 80.0–100.0)
Platelets: 286 10*3/uL (ref 150–400)
RBC: 4.33 MIL/uL (ref 4.22–5.81)
RDW: 19.4 % — ABNORMAL HIGH (ref 11.5–15.5)
WBC: 16.9 10*3/uL — ABNORMAL HIGH (ref 4.0–10.5)
nRBC: 2.3 % — ABNORMAL HIGH (ref 0.0–0.2)

## 2019-04-12 LAB — GLUCOSE, CAPILLARY
Glucose-Capillary: 96 mg/dL (ref 70–99)
Glucose-Capillary: 97 mg/dL (ref 70–99)

## 2019-04-12 LAB — GLIADIN ANTIBODIES, SERUM
Antigliadin Abs, IgA: 5 units (ref 0–19)
Gliadin IgG: 5 units (ref 0–19)

## 2019-04-12 LAB — TISSUE TRANSGLUTAMINASE, IGA: Tissue Transglutaminase Ab, IgA: <2 U/mL (ref 0–3)

## 2019-04-12 MED ORDER — POLYETHYLENE GLYCOL 3350 17 G PO PACK: PACK | ORAL | 0 refills | Status: DC

## 2019-04-12 NOTE — Plan of Care (Signed)
  Problem: Pain Managment: Goal: General experience of comfort will improve Outcome: Completed/Met   Problem: Safety: Goal: Ability to remain free from injury will improve Outcome: Completed/Met

## 2019-04-12 NOTE — Progress Notes (Signed)
DISCHARGE NOTE HOME MARTINEZ BOXX to be discharged [place] per MD order. Report given to [facility].   IV catheter discontinued intact. Site without signs and symptoms of complications. Dressing and pressure applied. Pt denies pain at the site currently. No complaints noted.   An After Visit Summary (AVS) was printed and given and sent to pt with [facility] transport. Orville Govern, RN

## 2019-04-12 NOTE — TOC Initial Note (Signed)
Transition of Care [facility]) - Initial/Assessment Note    Patient Details  Name: [name] MRN: [id number] Date of Birth: 19-Nov-1960  Transition of Care [facility]) CM/SW Contact:    Sable Feil, LCSW Phone Number: 04/12/2019, 1:48 PM  Clinical Narrative:  Talked with patient's sister, [name] [phone]) regarding patient's readiness for discharge. Sister confirmed that patient is from group home and has been there for 30 plus years, and is one of the original residents there. Ms. Berline Chough commented that the staff is very good to the residents. Per sister, patient will return to facility.      Call made to group home and spoke with staff caretaker [name] [phone]) regarding patient's probable discharge today and patient can return. CSW provided with fax number - [phone]. CSW also advised that patient cannot return with a catheter, as they cannot manage them..            Expected Discharge Plan: Group Home([facility] [place], [place]) Barriers to Discharge: No Barriers Identified   Patient Goals and CMS Choice Patient states their goals for this hospitalization and ongoing recovery are:: Talked with patient's sister [name] who wishes patient to return to group home CMS Medicare.gov Compare Post Acute Care list provided to:: Other (Comment Required)(List not needed as patient will return to group home) Choice offered to / list presented to : NA  Expected Discharge Plan and Services Expected Discharge Plan: Group Home([facility] [place], [place]) In-house Referral: Clinical Social Work     Living arrangements for the past 2 months: Group Home                                      Prior Living Arrangements/Services Living arrangements for the past 2 months: Group Home Lives with:: Other (Comment)(Other group home residents)   Do you feel safe going back to the place where you live?: Yes      Need for Family  Participation in Patient Care: Yes (Comment) Care giver support system in place?: Yes (comment)   Criminal Activity/Legal Involvement Pertinent to Current Situation/Hospitalization: No - Comment as needed  Activities of Daily Living      Permission Sought/Granted Permission sought to share information with : Family Supports(Patient not oriented to situation, contacted sister)                Emotional Assessment Appearance:: Other (Comment Required(Did not visit with patient - talked with sister by phone) Attitude/Demeanor/Rapport: Unable to Assess Affect (typically observed): Unable to Assess Orientation: : Oriented to Self, Oriented to Place Alcohol / Substance Use: Never Used Psych Involvement: No (comment)  Admission diagnosis:  Hypokalemia [E87.6] Acute renal failure, unspecified acute renal failure type ([place]) [N17.9] Acute renal failure (ARF) (HCC) [N17.9] Patient Active Problem List   Diagnosis Date Noted  . Urinary retention   . Lactic acidosis   . AKI (acute kidney injury) ([place]) 04/08/2019  . Acute renal failure (ARF) ([place]) 04/08/2019  . Acute renal failure ([place])   . Hypokalemia   . Diarrhea   . Pressure injury of skin 08/10/2018  . Gastric wall thickening 08/10/2018  . Alkaline phosphatase elevation 08/10/2018  . Cholelithiasis 08/10/2018  . Microcytic anemia   . Hiatal hernia 04/07/2018  . 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 Quadriplegia ([place]) 12/15/2015  . Cerebral palsy, quadriplegic ([place])   . Pre-diabetes 08/31/2015  .  Foot ulcer, limited to breakdown of skin (HCC) 06/08/2015  . Constipation 11/01/2014  . Seasonal allergies 11/01/2014  . Abdominal pain   . Pressure ulcer of foot 03/14/2014  . Pressure ulcer 01/08/2013  . URINARY INCONTINENCE 02/09/2010  . Mental retardation 07/24/2006  . Infantile cerebral palsy (HCC) 07/24/2006   PCP:  Joana Reamer, DO Pharmacy:   [facility] - [place], [place] - [address]  [address] [place] [address] Phone: [phone] Fax: [phone]   Social Determinants of Health (SDOH) Interventions  No SDOH interventions needed  Readmission Risk Interventions No flowsheet data found.

## 2019-04-12 NOTE — Discharge Instructions (Signed)
It was a pleasure taking care of you in the hospital!  While here, you were treated for diarrhea, abdominal pain, and a kidney injury, and you have recovered from these. This was caused by too much laxative.  1. Use miralax 17g powder and titrate use to 1 daily, soft stool. Start with once daily dosing, if no stool, then increase to twice daily dosing. If patient has more than 1 stool per day or has diarrhea, DO NOT USE a laxative (this includes dulcolax, colace, senna, miralax, enemas, etc.). If he still has constipation with twice daily dosing of miralax powder, please let your primary care provider know to help with appropriate titration of laxatives. Only use immodium if he has diarrhea.  2. During his stay, the stool was very foul smelling and we suspect there may be a malabsorptive process such as Celiac's disease. Confirmatory lab tests are pending, in the meantime please feed [name] a gluten-free diet.  3. Please continue daily wound care of the erosions on his feet and frequent repositioning to prevent the stage 1 pressure wound on his left sacrum from becoming worse.   4. If he has diarrhea with more than 5-6 stools in a day, if he has new fever above 100.4*F and cannot keep down any fluids, please seek immediate medical attention.   Be Well!  Dr. Leary Roca   Gluten-Free Diet for Celiac Disease, Adult  The gluten-free diet includes all foods that do not contain gluten. Gluten is a protein that is found in wheat, rye, barley, and some other grains. Following the gluten-free diet is the only treatment for people with celiac disease. It helps to prevent damage to the intestines and improves or eliminates the symptoms of celiac disease. Following the gluten-free diet requires some planning. It can be challenging at first, but it gets easier with time and practice. There are more gluten-free options available today than ever before. If you need help finding gluten-free foods or if you have  questions, talk with your diet and nutrition specialist (registered dietitian) or your health care provider. What do I need to know about a gluten-free diet?  All fruits, vegetables, and meats are safe to eat and do not contain gluten.  When grocery shopping, start by shopping in the produce, meat, and dairy sections. These sections are more likely to contain gluten-free foods. Then move to the aisles that contain packaged foods if you need to.  Read all food labels. Gluten is often added to foods. Always check the ingredient list and look for warnings, such as may contain gluten."  Talk with your dietitian or health care provider before taking a gluten-free multivitamin or mineral supplement.  Be aware of gluten-free foods having contact with foods that contain gluten (cross-contamination). This can happen at home and with any processed foods. ? Talk with your health care provider or dietitian about how to reduce the risk of cross-contamination in your home. ? If you have questions about how a food is processed, ask the manufacturer. What key words help to identify gluten? Foods that list any of these key words on the label usually contain gluten:  Wheat, flour, enriched flour, bromated flour, white flour, durum flour, graham flour, phosphated flour, self-rising flour, semolina, farina, barley (malt), rye, and oats.  Starch, dextrin, modified food starch, or cereal.  Thickening, fillers, or emulsifiers.  Malt flavoring, malt extract, or malt syrup.  Hydrolyzed vegetable protein. In the [place] packaged foods that are gluten-free are required to be labeled  GF. These foods should be easy to identify and are safe to eat. In the [place] food companies are also required to list common food allergens, including wheat, on their labels. Recommended foods Grains  Amaranth, bean flours, 100% buckwheat flour, corn, millet, nut flours or nut meals, GF oats, quinoa, rice, sorghum, teff, rice wafers,  pure cornmeal tortillas, popcorn, and hot cereals made from cornmeal. Hominy, rice, wild rice. Some Asian rice noodles or bean noodles. Arrowroot starch, corn bran, corn flour, corn germ, cornmeal, corn starch, potato flour, potato starch flour, and rice bran. Plain, brown, and sweet rice flours. Rice polish, soy flour, and tapioca starch. Vegetables  All plain fresh, frozen, and canned vegetables. Fruits  All plain fresh, frozen, canned, and dried fruits, and 100% fruit juices. Meats and other protein foods  All fresh beef, pork, poultry, fish, seafood, and eggs. Fish canned in water, oil, brine, or vegetable broth. Plain nuts and seeds, peanut butter. Some lunch meat and some frankfurters. Dried beans, dried peas, and lentils. Dairy  Fresh plain, dry, evaporated, or condensed milk. Cream, butter, sour cream, whipping cream, and most yogurts. Unprocessed cheese, most processed cheeses, some cottage cheese, some cream cheeses. Beverages  Coffee, tea, most herbal teas. Carbonated beverages and some root beers. Wine, sake, and distilled spirits, such as gin, vodka, and whiskey. Most hard ciders. Fats and oils  Butter, margarine, vegetable oil, hydrogenated butter, olive oil, shortening, lard, cream, and some mayonnaise. Some commercial salad dressings. Olives. Sweets and desserts  Sugar, honey, some syrups, molasses, jelly, and jam. Plain hard candy, marshmallows, and gumdrops. Pure cocoa powder. Plain chocolate. Custard and some pudding mixes. Gelatin desserts, sorbets, frozen ice pops, and sherbet. Cake, cookies, and other desserts prepared with allowed flours. Some commercial ice creams. Cornstarch, tapioca, and rice puddings. Seasoning and other foods  Some canned or frozen soups. Monosodium glutamate (MSG). Cider, rice, and wine vinegar. Baking soda and baking powder. Cream of tartar. Baking and nutritional yeast. Certain soy sauces made without wheat (ask your dietitian about specific  brands that are allowed). Nuts, coconut, and chocolate. Salt, pepper, herbs, spices, flavoring extracts, imitation or artificial flavorings, natural flavorings, and food colorings. Some medicines and supplements. Some lip glosses and other cosmetics. Rice syrups. The items listed may not be a complete list. Talk with your dietitian about what dietary choices are best for you. Foods to avoid Grains  Barley, bran, bulgur, couscous, cracked wheat, Zeigler, farro, graham, malt, matzo, semolina, wheat germ, and all wheat and rye cereals including spelt and kamut. Cereals containing malt as a flavoring, such as rice cereal. Noodles, spaghetti, macaroni, most packaged rice mixes, and all mixes containing wheat, rye, barley, or triticale. Vegetables  Most creamed vegetables and most vegetables canned in sauces. Some commercially prepared vegetables and salads. Fruits  Thickened or prepared fruits and some pie fillings. Some fruit snacks and fruit roll-ups. Meats and other protein foods  Any meat or meat alternative containing wheat, rye, barley, or gluten stabilizers. These are often marinated or packaged meats and lunch meats. Bread-containing products, such as Swiss steak, croquettes, meatballs, and meatloaf. Most tuna canned in vegetable broth and Malaw[place]urkey with hydrolyzed vegetable protein (HVP) injected as part of the basting. Seitan. Imitation fish. Eggs in sauces made from ingredients to avoid. Dairy  Commercial chocolate milk drinks and malted milk. Some non-dairy creamers. Any cheese product containing ingredients to avoid. Beverages  Certain cereal beverages. Beer, ale, malted milk, and some root beers. Some hard ciders. Some instant flavored  coffees. Some herbal teas made with barley or with barley malt added. Fats and oils  Some commercial salad dressings. Sour cream containing modified food starch. Sweets and desserts  Some toffees. Chocolate-coated nuts (may be rolled in wheat flour) and  some commercial candies and candy bars. Most cakes, cookies, donuts, pastries, and other baked goods. Some commercial ice cream. Ice cream cones. Commercially prepared mixes for cakes, cookies, and other desserts. Bread pudding and other puddings thickened with flour. Products containing brown rice syrup made with barley malt enzyme. Desserts and sweets made with malt flavoring. Seasoning and other foods  Some curry powders, some dry seasoning mixes, some gravy extracts, some meat sauces, some ketchups, some prepared mustards, and horseradish. Certain soy sauces. Malt vinegar. Bouillon and bouillon cubes that contain HVP. Some chip dips, and some chewing gum. Yeast extract. Brewers yeast. Caramel color. Some medicines and supplements. Some lip glosses and other cosmetics. The items listed may not be a complete list. Talk with your dietitian about what dietary choices are best for you. Summary  Gluten is a protein that is found in wheat, rye, barley, and some other grains. The gluten-free diet includes all foods that do not contain gluten.  If you need help finding gluten-free foods or if you have questions, talk with your diet and nutrition specialist (registered dietitian) or your health care provider.  Read all food labels. Gluten is often added to foods. Always check the ingredient list and look for warnings, such as may contain gluten." This information is not intended to replace advice given to you by your health care provider. Make sure you discuss any questions you have with your health care provider. Document Released: 05/13/2005 Document Revised: 04/25/2017 Document Reviewed: 02/26/2016 Elsevier Patient Education  2020 [employer].

## 2019-04-12 NOTE — NC FL2 (Signed)
[place] MEDICAID FL2 LEVEL OF CARE SCREENING TOOL     IDENTIFICATION  Patient Name: [name] Birthdate: February 16, 1961 Sex: male Admission Date (Current Location): 04/08/2019  [place]  [place] [id number] S Facility and Address:  [facility] [place], [address], [place], [place] [address]      Provider Number:    Attending Physician Name and Address:  Doreene ElandEniola, Kehinde T, MD  Relative Name and Phone Number:  Lynda RainwaterDiane Fulcher - sister, [phone] (mobile)    Current Level of Care:   Recommended Level of Care: Family Care Home([facility]) Prior Approval Number:    Date Approved/Denied:   PASRR Number:    Discharge Plan: Domiciliary (Rest home)(Group Home -  [facility])    Current Diagnoses: Patient Active Problem List   Diagnosis Date Noted  . Urinary retention   . Lactic acidosis   . AKI (acute kidney injury) (HCC) 04/08/2019  . Acute renal failure (ARF) (HCC) 04/08/2019  . Acute renal failure (HCC)   . Hypokalemia   . Diarrhea   . Pressure injury of skin 08/10/2018  . Gastric wall thickening 08/10/2018  . Alkaline phosphatase elevation 08/10/2018  . Cholelithiasis 08/10/2018  . Microcytic anemia   . Hiatal hernia 04/07/2018  . 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 Quadriplegia (HCC) 12/15/2015  . Cerebral palsy, quadriplegic (HCC)   . Pre-diabetes 08/31/2015  . Foot ulcer, limited to breakdown of skin (HCC) 06/08/2015  . Constipation 11/01/2014  . Seasonal allergies 11/01/2014  . Abdominal pain   . Pressure ulcer of foot 03/14/2014  . Pressure ulcer 01/08/2013  . URINARY INCONTINENCE 02/09/2010  . Mental retardation 07/24/2006  . Infantile cerebral palsy (HCC) 07/24/2006    Orientation RESPIRATION BLADDER Height & Weight        Normal Incontinent, External catheter(External catheter removed prior to discharge) Weight: 154 lb 15.7 oz (70.3 kg) Height:      BEHAVIORAL SYMPTOMS/MOOD NEUROLOGICAL BOWEL NUTRITION STATUS      Incontinent Diet(Regular diet)  AMBULATORY STATUS COMMUNICATION OF NEEDS Skin   Total Care   Other (Comment), PU Stage and Appropriate Care(Stg 2 pressure injury to coccyx and left arm. Unstageable pressure injuries to left ankle, right medial foot. Abrasion to toes. MASD to buttocks, groin, scrotum-treated with barrier cream. Skin tears (scab) to right ear-guaze placed.)   PU Stage 2 Dressing: No Dressing                   Personal Care Assistance Level of Assistance  Bathing, Feeding, Dressing Bathing Assistance: Limited assistance Feeding assistance: Independent Dressing Assistance: Limited assistance     Functional Limitations Info  Sight, Hearing, Speech Sight Info: Adequate Hearing Info: Adequate Speech Info: Adequate    SPECIAL CARE FACTORS FREQUENCY                       Contractures Contractures Info: Present(Contractures to right and left upper arms)    Additional Factors Info  Code Status, Allergies Code Status Info: Full Allergies Info: Adhesive tape           Current Medications (04/12/2019):  This is the current hospital active medication list Current Facility-Administered Medications  Medication Dose Route Frequency Provider Last Rate Last Dose  . acetaminophen (TYLENOL) tablet 650 mg  650 mg Oral Q6H PRN Myrene BuddyFletcher, Jacob, MD   650 mg at 04/08/19 1924   Or  . acetaminophen (TYLENOL) suppository  650 mg  650 mg Rectal Q6H PRN Guadalupe Dawn, MD      . baclofen (LIORESAL) tablet 20 mg  20 mg Oral TID Gladys Damme, MD   20 mg at 04/12/19 1002  . Chlorhexidine Gluconate Cloth 2 % PADS 6 each  6 each Topical Q0600 Guadalupe Dawn, MD   6 each at 04/12/19 0546  . divalproex (DEPAKOTE SPRINKLE) capsule 500 mg  500 mg Oral BID Guadalupe Dawn, MD   500 mg at 04/12/19 1002  . enoxaparin (LOVENOX) injection 40 mg  40 mg Subcutaneous Q24H Guadalupe Dawn, MD   40 mg at 04/11/19 2136  .  fesoterodine (TOVIAZ) tablet 4 mg  4 mg Oral Daily Guadalupe Dawn, MD   4 mg at 04/12/19 1002  . liver oil-zinc oxide (DESITIN) 40 % ointment 1 application  1 application Topical BID Guadalupe Dawn, MD   1 application at 57/32/20 1003  . mupirocin ointment (BACTROBAN) 2 % 1 application  1 application Nasal BID Guadalupe Dawn, MD   1 application at 25/42/70 1003  . pantoprazole (PROTONIX) EC tablet 40 mg  40 mg Oral Daily Guadalupe Dawn, MD   40 mg at 04/12/19 1002  . potassium chloride 20 MEQ/15ML (10%) solution 20 mEq  20 mEq Oral BID Gladys Damme, MD   20 mEq at 04/12/19 1002  . risperiDONE (RISPERDAL) tablet 2 mg  2 mg Oral QHS Guadalupe Dawn, MD   2 mg at 04/11/19 2136  . tamsulosin (FLOMAX) capsule 0.4 mg  0.4 mg Oral Daily Guadalupe Dawn, MD   0.4 mg at 04/12/19 1002  . tiZANidine (ZANAFLEX) tablet 2 mg  2 mg Oral TID Gladys Damme, MD   2 mg at 04/12/19 1002     Discharge Medications: Please see discharge summary for a list of discharge medications.  Relevant Imaging Results:  Relevant Lab Results:   Additional Information DISCHARGE MEDICATIONS:  Medication List        STOP taking these medications       bisacodyl 5 MG EC tablet Commonly known as: DULCOLAX   docusate sodium 100 MG capsule Commonly known as: Colace   guaifenesin 100 MG/5ML syrup Commonly known as: ROBITUSSIN   loperamide 2 MG capsule Commonly known as: IMODIUM   magnesium hydroxide 400 MG/5ML suspension Commonly known as: MILK OF MAGNESIA   triamcinolone cream 0.1 % Commonly known as: KENALOG             TAKE these medications       acetaminophen 500 MG tablet Commonly known as: TYLENOL Take 1,000 mg by mouth every 6 (six) hours as needed for moderate pain.   ascorbic acid 250 MG tablet Commonly known as: VITAMIN C Take 1 tablet (250 mg total) by mouth daily.   baclofen 20 MG tablet Commonly known as: LIORESAL Take 1 tablet (20 mg total) by mouth 3 (three) times  daily.   brompheniramine-pseudoephedrine 1-15 MG/5ML Elix Commonly known as: DIMETAPP Take 10 mLs by mouth 2 (two) times daily as needed (cold symptoms). Use as directed as needed for cold symptoms per standing orders   cetirizine 10 MG tablet Commonly known as: ZYRTEC Take 1 tablet (10 mg total) by mouth daily. What changed: when to take this   clotrimazole 1 % cream Commonly known as: LOTRIMIN Apply 1 application topically 2 (two) times daily.   diclofenac sodium 1 % Gel Commonly known as: VOLTAREN Apply 4 g topically 4 (four) times daily.   divalproex 125 MG capsule Commonly known as: DEPAKOTE  SPRINKLE Take 500 mg by mouth 2 (two) times daily.   Ear Drops Earwax Aid 6.5 % OTIC solution Generic drug: carbamide peroxide PLACE 5 DROPS INTO THE LEFT EAR TWICE DAILY What changed: See the new instructions.   guaiFENesin-dextromethorphan 100-10 MG/5ML syrup Commonly known as: ROBITUSSIN DM Take 5 mLs by mouth every 4 (four) hours as needed for cough.   hydrocortisone cream 1 % APPLY TOPICALLY TO FACE AT BEDTIME What changed: See the new instructions.   Jobst Anti-Em Knee High Med Misc by Does not apply route. Apply once in the morning and remove at bedtime daily   ketoconazole 2 % shampoo Commonly known as: NIZORAL APPLY TOPICALLY TO AFFECTED AREA(S) TWICE WEEKLY AS DIRECTED What changed: See the new instructions.   LORazepam 2 MG tablet Commonly known as: ATIVAN Take 1 mg by mouth every 4 (four) hours as needed for anxiety.   Mylanta 200-200-20 MG/5ML suspension Generic drug: alum & mag hydroxide-simeth Take 30 mLs by mouth every 6 (six) hours as needed for indigestion or heartburn. Use as directed as needed for nausea/indigestion per standing order   omeprazole 40 MG capsule Commonly known as: PRILOSEC Take 1 capsule (40 mg total) by mouth 2 (two) times daily.   oyster calcium 500 MG Tabs tablet Take 1,500 mg of elemental calcium by mouth daily.    polyethylene glycol 17 g packet Commonly known as: MiraLax Take 17g by mouth daily as needed. Please do not give if having soft, regular bowel movements. What changed:   how much to take  how to take this  when to take this  additional instructions   Prevalon Heel Protector Misc 1 Device by Does not apply route daily.   risperiDONE 2 MG tablet Commonly known as: RISPERDAL Take 2 mg by mouth at bedtime.   tamsulosin 0.4 MG Caps capsule Commonly known as: FLOMAX TAKE 1 CAPSULE BY MOUTH EVERY DAY *DO NOT CRUSH* What changed: See the new instructions.   tiZANidine 2 MG tablet Commonly known as: ZANAFLEX Take 2 mg by mouth 3 (three) times daily.   tolterodine 2 MG 24 hr capsule Commonly known as: Detrol LA Take 1 capsule (2 mg total) by mouth daily. What changed: when to take this   tretinoin 0.025 % gel Commonly known as: RETIN-A Apply 1 application topically at bedtime.   TRIPLE ANTIBIOTIC EX Apply 1 application topically as needed (cuts/scrapes/scratches).   Vitamin D3 125 MCG (5000 UT) Caps Take 5,000 Units by mouth daily.   Zinc Oxide 40 % Pste APPLY A THIN LAYER TOPICALLY TO BUTTOCKS AND SCROTUM TWICE DAILY *BEFIN AFTER COMPLETION OF CLOTRIMAZOLE OINTMENT* What changed: See the new instructions.            Cristobal Goldmann, LCSW

## 2019-04-12 NOTE — Progress Notes (Signed)
Family Medicine Teaching Service Daily Progress Note Intern Pager: [phone]  Patient name: [name] Medical record number: [id number] Date of birth: [birth year] Age: 58 y.o. Gender: male  Primary Care Provider: [name], DO Consultants: None  Code Status: Full  Pt Overview and Major Events to Date:  11/11: admitted for dehydration, abdominal pain 11/14: GI panel negative x2, Blood culture negative x2, C. Diff negative x2, Urine culture negative x2  Assessment and Plan: [name] is a 58 y.o. male presenting from [facility] group home with persistent diarrhea and right lower abdominal pain. PMH is significant for cerebral palsy (quadriplegic), cholelithiasis, chronic large hiatal hernia, h/o DVT, h/o constipation, h/o pressure injuries, urinary incontinence.   Persistent Diarrhea  Right Lower Abdominal Pain: Diarrhea decreased yesterday, stools more formed. Rectal tube removed yesterday, IVF d/c yesterday. Much improved smell in room. Patient's stool output ~200cc yesterday.  Gluten free diet order in place, celiac labs collected, will follow up results. K+ 3.8 today. Appears patient was over-treated with laxatives at group home, laxatives continue to be held. Home later today. -Tylenol for pain control -Gluten-free diet  Leukocytosis: WBCs continue to rise from 15.5 to 16.9. Unclear source, pt remains afebrile, VSS. No evidence of infection in skin. Possibility that this is intestinal inflammation from over stimulating by laxatives. No other evidence of infectious process. - Will continue to monitor  AKI  Oligouria: resolved Creatinine stable at 0.5.  UOP 800 cc yesterday, recovered. - avoid nephrotoxic agents  Hypokalemia (resolved) K 3.8 this morning.  No need for repletion.  Chronic elevated Alkaline Phosphatase: Alk phos stable at 154. Unclear etiology.  - Recommend continued follow up outpatient.    Chronic DVT of femoral vein: Not  on anticoagulation. Appears to be chronic and stable +1 pedal edema is pt's baseline. No erythema, pain, pt has no complaints. VSS. - Lovenox for DVT prophylaxis  Cerebral Palsy  Quadriplegia: Patient lives in group home. Home meds include: Risperdal and Depakote  - continue home meds  Chronic Pressure Ulcers: Located on L ankle, R lateral foot. No signs of infection. Heel floaters.  - daily wound care - wound care consulted  Code status: Full code, can readdress an outpatient  FEN/GI:  Gluten-free, MIVF Prophylaxis: Lovenox  Disposition: Transfer to [place], DC telemetry  Subjective:  Doing well this morning, no acute distress. Abdominal pain has improved, now minimal. Home today.  Objective: Temp:  [97.7 F (36.5 C)-99.4 F (37.4 C)] 99.4 F (37.4 C) (11/16 0531) Pulse Rate:  [92-98] 92 (11/16 0531) Resp:  [16-20] 16 (11/16 0531) BP: (116-134)/(60-69) 117/60 (11/16 0531) SpO2:  [92 %-98 %] 92 % (11/16 0531) Physical Exam: General: older Caucasian male, no acute distress, with laying on left side, able speak in full sentences Cardiovascular: Regular rate and rhythm, no M/R/G Respiratory: Lungs clear to auscultation bilaterally, no accessory muscle use Abdomen: Soft, non tender, no distention, rectal tube in place Skin: Stage 1 pressure ulcer on L side of sacrum, no erythema, no edema, skin intact. Extremities: warm and well perfused.  LLE: +1 pedal edema in dosum of L foot, no erythema, pain, no edema present above level of malleolus. Erosion present on anteromedial aspect of L ankle, no erythema, edema, or discharge. RLE: no edema, 1 erosion present on dorsolateral aspect of R foot, no erythema, edema, or discharge present  Laboratory: Recent Labs  Lab 04/09/19 0307 04/10/19 0000 04/11/19 0519  WBC 9.2 13.7* 15.5*  HGB 11.5* 11.4* 10.8*  HCT 36.0* 35.4* 32.9*  PLT 337 343 307   Recent Labs  Lab 04/06/19 1114 04/08/19 0922  04/09/19 0307 04/10/19 0000  04/11/19 0519  NA 138 135   < > 139 140 139  K 3.2* 2.6*   < > 3.9 3.8 3.6  CL 104 102   < > 111 114* 110  CO2 24 14*   < > 16* 16* 19*  BUN 18 34*   < > 24* 14 12  CREATININE 0.58* 2.47*   < > 0.87 0.58* 0.40*  CALCIUM 9.7 10.4*   < > 9.6 9.7 9.3  PROT 7.8 8.0  --   --  6.2*  --   BILITOT 0.7 0.4  --   --  0.5  --   ALKPHOS 154* 154*  --   --  111  --   ALT 14 17  --   --  17  --   AST 13* 22  --   --  14*  --   GLUCOSE 106* 152*   < > 103* 106* 97   < > = values in this interval not displayed.   EKG: NSR, QTc manual 431  Imaging/Diagnostic Tests: No results found.   Gladys Damme, MD 04/12/2019, 6:20 AM PGY-1, [place] Intern pager: [phone], text pages welcome

## 2019-04-12 NOTE — TOC Transition Note (Signed)
Transition of Care (TOC) - CM/SW Discharge Note *Discharged back to [facility] via ambulance  Patient Details  Name: [name] MRN: [id number] Date of Birth: 08-Nov-1960  [facility]) CM/SW Contact:  Sable Feil, LCSW Phone Number: 04/12/2019, 4:52 PM   Clinical Narrative: Patient medically stable for discharge and per sister, Ms. Fulcher, will return to [facility]. Facility contacted and per staff person [name], patient can return. Clinicals faxed and reviewed by staff and patient cleared to return. [name] transported by non-emergency ambulance.      Final next level of care: Group Home Barriers to Discharge: No Barriers Identified   Patient Goals and CMS Choice Patient states their goals for this hospitalization and ongoing recovery are:: Talked with patient's sister [name]. She requested patient return to [facility] once medically stable for discharge CMS Medicare.gov Compare Post Acute Care list provided to:: Other (Comment Required)(List not needed as patient returned to [facility]) Choice offered to / list presented to : NA  Discharge Placement              Patient chooses bed at: (Patient returning to [facility]) [facility] Patient to be transferred to facility by: Ambulance Name of family member notified: Cinda Quest - sister - [phone] (mobile) Patient and family notified of of transfer: 04/12/19  Discharge Plan and Services In-house Referral: Clinical Social Work                                 Social Determinants of Health ([facility]) Interventions  No SDOH interventions needed or requested at discharge   Readmission Risk Interventions No flowsheet data found.

## 2019-04-13 LAB — CULTURE, BLOOD (ROUTINE X 2)
Culture: NO GROWTH
Culture: NO GROWTH

## 2019-04-13 NOTE — Care Management Important Message (Signed)
Important Message  Patient Details  Name: [name] MRN: [id number] Date of Birth: [birth year]   Medicare Important Message Given:  Yes     Niasia Lanphear Montine Circle 04/13/2019, 11:39 AM

## 2019-04-14 ENCOUNTER — Ambulatory Visit: Payer: Medicare Other | Admitting: Family Medicine

## 2019-04-14 LAB — RETICULIN ANTIBODIES, IGA W TITER: Reticulin Ab, IgA: NEGATIVE titer (ref <2.5)

## 2019-04-15 ENCOUNTER — Other Ambulatory Visit: Payer: Self-pay | Admitting: Family Medicine

## 2019-04-15 ENCOUNTER — Ambulatory Visit (INDEPENDENT_AMBULATORY_CARE_PROVIDER_SITE_OTHER): Payer: Medicare Other | Admitting: Family Medicine

## 2019-04-15 ENCOUNTER — Encounter: Payer: Self-pay | Admitting: Family Medicine

## 2019-04-15 ENCOUNTER — Other Ambulatory Visit: Payer: Self-pay

## 2019-04-15 VITALS — BP 102/62 | HR 83

## 2019-04-15 DIAGNOSIS — N3942 Incontinence without sensory awareness: Secondary | ICD-10-CM | POA: Diagnosis not present

## 2019-04-15 DIAGNOSIS — R197 Diarrhea, unspecified: Secondary | ICD-10-CM | POA: Diagnosis not present

## 2019-04-15 DIAGNOSIS — Z23 Encounter for immunization: Secondary | ICD-10-CM | POA: Diagnosis not present

## 2019-04-15 NOTE — Progress Notes (Signed)
   Subjective:    [name] - 58 y.o. male MRN [id number]  Date of birth: 1961-04-29  CC:  [name] is here for hospital follow-up for dehydration due to diarrhea.  HPI: Mr. Guo is here with his caregiver, and he reports that his abdominal pain and diarrhea have improved since discharge from the hospital.  He has 1 or 2 bowel movements per day which are not loose or watery.  He is not currently receiving any stool softeners or laxatives, although he does have them available if he becomes constipated.  His appetite is normal.  He was discharged with his condom catheter still in place and would like this removed today.  His caregiver would also like for help in ordering chucks from [facility] to help manage his fecal and urinary incontinence.  Health Maintenance:  There are no preventive care reminders to display for this patient.  -  reports that he has never smoked. He has never used smokeless tobacco. - Review of Systems: Per HPI. - Past Medical History: Patient Active Problem List   Diagnosis Date Noted  . Urinary retention   . Lactic acidosis   . AKI (acute kidney injury) ([place]) 04/08/2019  . Acute renal failure (ARF) ([place]) 04/08/2019  . Acute renal failure ([place])   . Hypokalemia   . Diarrhea   . Pressure injury of skin 08/10/2018  . Gastric wall thickening 08/10/2018  . Alkaline phosphatase elevation 08/10/2018  . Cholelithiasis 08/10/2018  . Microcytic anemia   . Hiatal hernia 04/07/2018  . 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 Quadriplegia ([place]) 12/15/2015  . Cerebral palsy, quadriplegic ([place])   . Pre-diabetes 08/31/2015  . Foot ulcer, limited to breakdown of skin ([place]) 06/08/2015  . Constipation 11/01/2014  . Seasonal allergies 11/01/2014  . Abdominal pain   . Pressure ulcer of foot 03/14/2014  . Pressure ulcer 01/08/2013  . URINARY INCONTINENCE 02/09/2010  . Mental retardation 07/24/2006  . Infantile cerebral  palsy ([place]) 07/24/2006   - Medications: reviewed and updated   Objective:   Physical Exam BP 102/62   Pulse 83   SpO2 99%  Gen: NAD, alert, cooperative with exam, well-appearing, pleasant HEENT: Moist mucous membranes CV: RRR, good S1/S2, no murmur Resp: CTABL, no wheezes, non-labored Abd: SNTND, BS present, no guarding or organomegaly GU: Condom catheter in place with mild swelling around attachment to penis, no skin breakdown, tenderness to palpation and removal of catheter.     Assessment & Plan:   URINARY INCONTINENCE Will send request to [facility] for chucks for the patient.  Condom catheter was removed today.  Diarrhea Likely iatrogenic due to multiple laxatives and stool softeners on patient's medication list.  Reviewed medication list with caregiver and discussed reasons to resume as needed MiraLAX.  Will obtain BMP today to ensure continued improvement in kidney function and normalization of electrolytes.  Flu shot given today.  Maia Breslow, M.D. 04/16/2019, 9:03 AM PGY-3, [place]

## 2019-04-15 NOTE — Patient Instructions (Signed)
It was nice meeting you today [name]!  I am glad that you are feeling better since your hospitalization, we are checking a lab today to make sure that your electrolytes are normal.  I am also contacting [facility] to arrange for more chucks for you.  Please follow-up with wound care and let us know if you have any more issues regarding your abdominal pain and diarrhea.  If you have any questions or concerns, please feel free to call the clinic.   Be well,  Dr. Shan Levans

## 2019-04-16 LAB — BASIC METABOLIC PANEL
BUN/Creatinine Ratio: 31 — ABNORMAL HIGH (ref 9–20)
BUN: 15 mg/dL (ref 6–24)
CO2: 19 mmol/L — ABNORMAL LOW (ref 20–29)
Calcium: 8.9 mg/dL (ref 8.7–10.2)
Chloride: 102 mmol/L (ref 96–106)
Creatinine, Ser: 0.48 mg/dL — ABNORMAL LOW (ref 0.76–1.27)
GFR calc Af Amer: 139 mL/min/{1.73_m2} (ref >59)
GFR calc non Af Amer: 121 mL/min/{1.73_m2} (ref >59)
Glucose: 97 mg/dL (ref 65–99)
Potassium: 3.7 mmol/L (ref 3.5–5.2)
Sodium: 141 mmol/L (ref 134–144)

## 2019-04-16 NOTE — Assessment & Plan Note (Signed)
Likely iatrogenic due to multiple laxatives and stool softeners on patient's medication list.  Reviewed medication list with caregiver and discussed reasons to resume as needed MiraLAX.  Will obtain BMP today to ensure continued improvement in kidney function and normalization of electrolytes.

## 2019-04-16 NOTE — Assessment & Plan Note (Addendum)
Will send request to [facility] for chucks for the patient.  Condom catheter was removed today.

## 2019-04-19 ENCOUNTER — Other Ambulatory Visit: Payer: Self-pay | Admitting: Family Medicine

## 2019-04-19 DIAGNOSIS — G808 Other cerebral palsy: Secondary | ICD-10-CM

## 2019-04-28 ENCOUNTER — Other Ambulatory Visit: Payer: Self-pay

## 2019-04-28 ENCOUNTER — Encounter (HOSPITAL_BASED_OUTPATIENT_CLINIC_OR_DEPARTMENT_OTHER): Payer: Medicare Other | Attending: Physician Assistant | Admitting: Physician Assistant

## 2019-04-28 DIAGNOSIS — R532 Functional quadriplegia: Secondary | ICD-10-CM | POA: Insufficient documentation

## 2019-04-28 DIAGNOSIS — I7389 Other specified peripheral vascular diseases: Secondary | ICD-10-CM | POA: Insufficient documentation

## 2019-04-28 DIAGNOSIS — L89522 Pressure ulcer of left ankle, stage 2: Secondary | ICD-10-CM | POA: Diagnosis not present

## 2019-04-28 DIAGNOSIS — L89152 Pressure ulcer of sacral region, stage 2: Secondary | ICD-10-CM | POA: Diagnosis present

## 2019-04-28 DIAGNOSIS — Z89411 Acquired absence of right great toe: Secondary | ICD-10-CM | POA: Diagnosis not present

## 2019-04-28 DIAGNOSIS — Z86718 Personal history of other venous thrombosis and embolism: Secondary | ICD-10-CM | POA: Insufficient documentation

## 2019-04-29 NOTE — Progress Notes (Signed)
[name] ([id number]) Visit Report for 04/28/2019 Chief Complaint Document Details Patient Name: Date of Service: OBI, SCRIMA 04/28/2019 9:45 AM Medical Record [id number] Patient Account Number: [id number] Date of Birth/Sex: Treating RN: 1961-02-04 (58 y.o. M) Primary Care Provider: Orpah Cobb Other Clinician: Referring Provider: Treating Provider/Extender:Stone III, Suzan Garibaldi in Treatment: 40 Information Obtained from: Patient Chief Complaint He is here for follow evaluation for ulcers to his left foot/malleolus 07/14/17; patient is here for multiple wounds on his bilateral feet and left ankle. Electronic Signature(s) Signed: 04/29/2019 10:36:32 AM By: Lenda Kelp PA-C Entered By: Lenda Kelp on 04/28/2019 10:19:53 -------------------------------------------------------------------------------- HPI Details Patient Name: Date of Service: [name] 04/28/2019 9:45 AM Medical Record [id number] Patient Account Number: [id number] Date of Birth/Sex: Treating RN: [birth year] (59 y.o. M) Primary Care Provider: Orpah Cobb Other Clinician: Referring Provider: Treating Provider/Extender:Stone III, Suzan Garibaldi in Treatment: 74 History of Present Illness HPI Description: 11/23/15; this is a patient I remember from a previous stay in this clinic in 2012. The patient says this was for a wound in this same area as currently although I have no information to verify that. Most of the information is provided by his caregiver from the group home where he resides. Apparently he has had a wound in this area for 8 months. He also has erythema around this area and has had multiple rounds of antibiotics apparently with some improvement but the erythema is persists. He apparently had a fracture in the ankle 2 months ago and was seen by Dr. Lajoyce Corners . He apparently had a splint applied and more recently a heel offloading boot. He  had home health coming out to a week ago not clear what they are dressing this with but they apparently discharged him perhaps because of one of Dr. 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 He has a Nurse, adult for transferring at home he is nonambulatory and does not wear footwear. He is not a diabetic. Looking through [facility] he appears to have fairly active primary care. The area on the ankle is variably labeled as a pressure area and/or chronic venous insufficiency. He had a recent venous ultrasound on 5/25 that did not show a DVT in the left leg however this was not a reflux study. There was no superficial DVT either. Arterial studies on 10/15/14 showed a left ABI of 1.16 Center right of 1.12 waveforms were triphasic he does not have significant arterial disease. He has had recent x-rays of the left foot and ankle. The left foot did not show any abnormality. Left ankle x-ray showed a spiral fracture of the left tibial diaphysis without evidence of osteomyelitis. 11/30/15 culture I did last week showed pseudomonas I changed him to oral ciprofloxacin. Erythema is a lot better. 12/07/15 area around the wound shows considerable improvement of the periwound erythema. 12/14/15; the patient has a improvement of the periwound erythema which in retrospect I think with cellulitis successfully treated. We have been using Iodoflex started last week 12/25/15 wound on the left medial malleolus and dorsal left foot. Theraskin #1 applied READMISSION 02/27/16; the patient was admitted to hospital from 8/14 through 01/18/16 initially felt to have cellulitis of his left lower leg however he was noted to have significant persistent pain and swelling. A duplex ultrasound was ordered that was negative. Obie Dredge an x-ray was done that showed a spiral fracture of the left leg. With posterior displacement and angulation at the  mid left tibia. Orthopedics saw the patient and  splinted the leg. He was then sent to [facility] skilled facility and only return to his group home last week. He has wounds on the left medial malleolus, left dorsal foot and a worrisome area on the plantar left heel which is not really open but may represent a hematoma or a DTI 03/06/16; areas of left anterior and medial ankle both look improved. There are 3 small wounds here. He has a area over the dorsal left first metatarsal head which is not open. In meantime his left heel has opened 03/14/16 the medial left ankle wounds looks stable to improved. The area on the posterior left heel has opened up and had a very adherent eschar and slept over this 03/21/16 patient has 3 wounds on his left medial ankle is stable area on his left dorsal first metatarsal head appears to be improved. Continued problems with adherent eschar over the left heel 03/28/16; patient has 3 wounds on his left medial ankle and lower leg all of these appear to be stable. The problem continues to be the left heel and the adherent eschar. This may have been a pressure ulcer from her brace at one point. He has a English as a second language teacher we have been using [employer] and all wound areas. Dressings are being changed by home health 04/04/16; the patient's areas on the medial aspect of the left ankle looks superficial and appear to be gradually closing. The area on the plantar aspects/Achilles aspect of his left heel as surface slough and nonviable subcutaneous tissue requiring debridement. 04/11/16; the areas on the patient's medial left ankle appear to be superficial and appear to be closing. The area on the plantar heel on the left has surface slough and nonviable subcutaneous tissue requiring debridement. I don't think this is too much different from last week there is also undermining 04/25/16; patient is followed in his group home by [facility] home health. The area on the plantar heel is smaller but with still with some depth. We're  using Iodoflex to this. The areas on the left ankle looks healthy but dry and somewhat larger. Cause of this is not really clear using Prisma 05/02/16; areas x3 all look improved expecially the areas on the medial ankle. Heel wound is smaller but still deep 05/09/16; all areas 3 look improved on the medial ankle. Heel wound is also contracting. 12/0.9/17 the areas on his left medial ankle are all closed. I think these are largely chronic venous insufficiency however there is no edema. The area on his left heel was a pressure area. READMISSION 08/02/16; this man is a patient that we have had in this clinic on several times before. I believe he has cerebral palsy is nonambulatory. He has chronic venous insufficiency with venous inflammation. He was last year in the fall of 2017 with areas on his left medial ankle left heel and left dorsal foot. He arrives today with once again a vague history. As mentioned he is nonambulatory he spends most of his day in a pressure offloading boot nevertheless they noted wounds in this area on the medial left ankle 2 weeks ago. There is any other history here I am uncertain 08/15/16; the patient's wounds on his medial ankle actually look better however the area on the left lateral/dorsal foot is covered in black necrotic eschar. Previous formal arterial studies did not show significant arterial disease with normal ABIs and triphasic waveforms. He is immobilized and at risk for pressure  although he seems aware protective boots at all times as far as I am aware. 08/22/16; in general his wounds look better. All the wounds on his medial left ankle look improved the area on his lateral foot however continues to be problematic. Patient comes in the clinic today in a new wheelchair he is quite delighted 09/05/16; a wound on the left medial Calf and left dorsal foot are healed. The area on the left lateral foot is improved however the area on the left medial malleolus is  inflamed with a considerable degree of surrounding erythema. This looks like cellulitis. ABIs and clinical exam are within normal limits 09/12/16; the patient continues to have a wound on the left medial calf. I was concerned about cellulitis in this area last week and gave him a course of doxycycline. The wound looks better here. The area on the left lateral foot however is unchanged 09/20/16; the patient came in this week early after traumatizing his toe on the left foot on a table while moving around in his motorized wheelchair. 09/26/16- he is here for follow-up evaluation of his left foot and malleolus ulcers. Since his appointment he abraded his left medial hallux against a table while in his motorized wheelchair, apparently trying to pull up to the table for a meal. He continues to complain of tenderness to the left lateral foot, surrounding the current ulcer 10/07/16; left foot and malleolus ulcers. 10/14/16; the patient is doing well. He has a wound on the left lateral, left medial malleolus and atraumatic wound on the left great toe. Although these appear to be superficial and closing 10/31/16; the patient is doing well. The area on the left lateral foot, left medial malleolus and the left great toe or all epithelialized. He has home health 11/14/16; patient arrives today with the area on the left great toe healed. The left lateral foot however in the left medial malleolus and the ankle superficial wounds that are slightly larger. He has erythema and tenderness around the area on the left lateral foot. As well he is complaining of dysuria. His caregiver notes that when he gets like this he is more lethargic which indeed seems to be the case today 11/21/16; left great toe is still healed. His wound on the left lateral foot appears healthy and slightly smaller also the area on the left medial ankle. This had black eschar on it which I removed. The remaining wound looks satisfactory 12/12/16;  patient hasn't been here in a number of weeks. The area on the left lateral foot is healed however on his medial ankle has 2 open areas one of them covered with a necrotic eschar. He also has a new wound on the right second toe which was apparently trauma induced while he was at a movie 12/27/16; the patient has wounds on his left medial mallelus and left lateral foot that are just about closed. The area on the right second toe which was trauma from last time is still open but again everything looks a lot better here. 01/30/17; patient arrives in clinic today and is really been a long time since I've seen him. The wounds are all epithelialized on the left foot this includes his left lateral foot, left medial malleolus. Apparently was in the ER for what of I think was a fungal infection on his buttock's he was prescribed Diflucan and doxycycline I think this is already getting better. READMISSION Pryor is a now 58 year old man who has advanced cerebral palsy and functional  quadriplegia. We have had him in this clinic multiple times before for wounds on his right ankle, left ankle, left dorsal foot and most recently from 08/02/16 through 01/30/17 with his left medial ankle and left foot and finally right second toe. His attendant from the group home where he lives is very knowledgeable about him. She states that they noticed breakdown in his skin in his feet sometime in January although they could not get an appointment until today in this clinic. The previous wounds have always been felt to be secondary to incidental trauma/pressure areas or possibly some degree of chronic venous insufficiency. He had normal arterial studies in may of 2016. He has definite open wounds on the right lateral foot over the base of the fifth metatarsal, the right second toe DIP, the left medial malleolus. He has concerning areas over the left lateral fifth metatarsal base, retaform purpura over the medial left ankle and perhaps  the medial left first toe. 07/21/17; culture we did last week from the base of the right fifth metatarsal grew group G strep. This should've been sensitive to the doxycycline I gave him. The other major wounds are on the right second toe DIP, left medial malleolus, base of the left lateral fifth metatarsal and the medial left ankle and medial left toe. 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 We have macrovascular arterial studies scheduled for Thursday although I don't think this is what the problem is. 07/28/17; patient arrives with really no improvement. He has a wound on the right second toe, lateral right foot, right medial malleolus which was new this week, left medial malleolus, but looked like a DTI on the left great toe. His macrovascular studies are on 07/30/17 although I would be surprised if this provides all the explanation. He has small areas even on his right second and third toe which look like splinter-type hemorrhages. The question I have is where is this coming from. I'd like to see the vascular studies however before we pursue this. He has had a DVT in the past but is no longer on anticoagulants. I'll need to review his history 08/04/17; the patient had his ARTERIAL STUDIES this showed a right ABI of 1.16 and the left ABI of 1.20. TBI on the left at 0.84. He is at a right first toe amputation. The studies were unchanged from studies in May 2016. It was not felt that he had significant lower extremity arterial disease In the meantime we are using silver alginate to the wounds on the right second toe right lateral foot left medial malleolus. I'm going to change to Silver collagen today. 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 Working him up for this would include an echocardiogram probably a CT scan of the chest and abdomen to look at the  major thoracic and abdominal aortic vessels. I am not going to involve myself in this unless there is more symptoms/signs 08/18/17; the patient arrives today with wounds looking somewhat better. The remaining areas are the right second toe dorsal, right lateral foot and left medial malleolus which only has a small open area. We've been using silver collagen 09/01/17;right second toe dorsally probes to bone today. This is a deterioration. We've been using silver collagen. Only a small open area remains on the left medial malleolus. 09/09/17; right second toe appears better. There is no probing bone. He has a small area  remaining on the left medial malleolus, the right lateral foot. We have been using silver collagen 10/07/17; the patient returns to clinic today after a month hiatus. He has wounds over the right second toe, right lateral foot and the left medial malleolus. We have been using silver collagen. He has home health. 10/24/17; patient has open wounds over the right second toe, now the left fourth toe and a superficial area over his left buttock. Some of the other areas have healed including the left medial malleolus and right lateral foot. Using silver collagen on these wounds 11/07/17; the patient has closed his wounds on the right second toe and left fourth toe although they look all normal. Last time he was here he had a superficial area over the left buttock this is also closed. The right lateral foot wound I had closed out last time although it is seems to have reopened today. 628/19 the areas over the right second and left fourth toe remains closed. The right lateral foot has tightly adherent necrotic surface over. We have been using silver collagen he offloads this and a bunny boot 12/12/17; the patient has reopened the recurrent area over the right second PIP joint. 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 He does not have  obvious macrovascular disease. The area over the right lateral foot looks about the same as last time tightly adherent necrotic debris. We've been using Hydrofera Blue 12/25/17; 2 week follow-up. The area over the right second PIP is closed however the area on the right lateral foot is worse with surrounding erythema and pain compatible with cellulitis is going to need an antibiotic. AS WELL..... We have looked at his buttocks and lower back. He has 3 stage II ulcers. The exact history here is unclear however he is very disabled man with cerebral palsy. He goes to a day program from the group home in which she lives 5 days a week and I think he is on his buttock all day on these visits. Surrounding the wounds see has extensive stage I injury as well. FINALLY it looks as though he has lost weight. I'm not the only staff member that is recognized this 01/08/18 on evaluation today patient appears to be doing better in regard to his gluteal wounds. In fact everything appears to be healed back here although very dry. He has a couple open sores on the scrotal region other than this it is really his foot that is still giving him the most trouble. He did complete the Keflex unfortunately it seems like there's still a lot of erythema in this in fact looks worse than it did previous. I'm gonna culture this today and likely start with a different antibiotic. 01/16/18; I was reassured that the wounds on his buttocks and sacrum are healed. I did not look at these the day. The culture that was done last week on 01/11/18 of the right lateral foot wounds showed Pseudomonas. Bactrim is not a good choice for this. Ciprofloxacin interacts with his muscle relaxants therefore I prescribed cefdinir 300 twice a day for 7 days. Bactrim coverage of pseudomonas is not reliable although the degree of erythema looks a lot better 01/30/18; the patient has completed his antibiotics. The area does not have infection on the right  lateral foot. The wounds look a lot better. We've been using silver alginate 02/12/18; there is on the right lateral foot. Copious amounts of surface eschar. Also  similarly over the PIP of the right second toe. We've been using silver alginate to date 03/02/2018; the area that remains is on the right lateral foot. The right second toe is healed. 03/16/2018; the area there is original wound on the right lateral foot. Still requiring debridement. We are using Hydrofera Blue. The right second toe is still healed/closed 03/30/2018; right lateral foot there is 2 small open areas remaining. They actually look quite healthy we have been using Hydrofera Blue the right second toe remains closed 04/13/2018; right lateral foot. 1 of the 2 small open areas from last time has closed over. The other had surface slough that I removed with a #3 curette. This looks quite healthy. The right second toe remains closed. We are using Hydrofera Blue 05/01/2018; right lateral foot very superficial areas that look like they are trying to close over. He had a new threatened area on the left medial ankle just below the medial malleolus. I think these represent chronic venous insufficiency areas 05/14/2018; the right right lateral foot looks like it closed over as was predicted last time however he has some dark areas subcutaneously erythema superiorly and this is really very tender. This is either coexistent cellulitis or perhaps a deep tissue injury. The area on the left medial ankle is superficial but has some size. We have been using Hydrofera Blue to both areas. The patient complains of pain in his right foot 06/09/2018; right lateral foot was closed over last time but there was coexistent cellulitis around this. I gave him Levaquin. He also has an area on the left medial malleolus. He has not been back to clinic in over 3-1/2 weeks. We are using Hydrofera Blue I think to the left medial malleolus and silver alginate to  the right lateral foot 1/21; right lateral foot still non-viable debris over this area and the left medial malleolus. Both of these vigorously washed off with antiseptic gauze. We have been using silver alginate. 2/4; right lateral foot this looks somewhat better. Unfortunately the area over the left medial malleolus has deteriorated. Using silver alginate in both area 2/18; both wounds arrived today covered in a lot of necrotic debris. We have been using silver alginate. Clearly not making a lot of progress. 3/3; the patient's right second toe is reopened over the PIP. I am not sure why this is happening. He may have microvascular disease. We have checked his macrovascular flow in the past and found it to be normal. He may need an x-ray. The area on the right lateral foot requires debridement. The area on the left lateral ankle is worse. We have been using PolyMem Ag 08/26/18 on evaluation today patient appears to be doing a little worse in regard to his lower extremities based on what I'm seeing what the nursing staff is telling me. His left medial ankle appears potential to be infected there is your thing on want surrounding the wound. His right second toe is also of concern at this point in my pinion. We have not done x-rays of these areas that may be a good idea to go ahead and proceed with at this point to ensure will not deal with anything such as osteomyelitis. 4/16; it is been a while since I saw this patient. He now has wounds on the right lateral foot, dorsal right second toe, substantially on the left medial malleolus and a small area on the left lateral foot. We have been using PolyMem Silver. Curlex and Coban. He has home  health changing the dressing. Since last time he was here he had a culture of the left medial ankle that showed MRSA. He is completed a course/7 days of doxycycline. XRAYS of the left ankle and right foot that were ordered were negative for osteomyelitis 4/30; he  arrives in clinic today with some maceration around the wound and a lot of tenderness and erythema in a circumferential area around the right lateral foot. He has wound areas on the right lateral foot dorsal right second toe, largely over the left medial malleolus and a small area over the left lateral foot 5/7; completed his antibiotics from last time right lateral foot cellulitis is better and the area on his left knee which we discovered after he had been seen by the provider also is improved. He still has extensive wound areas over the right lateral foot, left medial malleolus, right second toe and left lateral foot although all of these look somewhat better with PolyMem 5/15. Wounds on the right lateral foot, right second toe left medial malleolus and the left lateral foot. All of these look somewhat better. We have been using PolyMem Ag 5/26; the wound on the right lateral foot is almost closed over but he still complains of a lot of pain in this area. He has a eschared area on the right second toe but no open wound.. Also concerning today his original wound on the left medial malleolus looks healthy however just inferior to this almost areas of deep tissue injury and there is an area on the left lateral foot also looks like a deep tissue injury. In the past I have wondered whether he might have cholesterol emboli or some other form of a vasculopathy. I wonder whether this could have something to do with this. 6/2; right lateral foot wound is still open I gave him empiric antibiotics last week without really a lot of change she is still complaining of pain in this area. We x-rayed his feet at some point in April these were negative. It would be difficult to do an MRI. The area on his left medial malleolus looks better. Right second toe is healed. He still has what looks to be a small pressure related area on the left lateral foot but it is not open. We are using PolyMem to all wound which  seems to be doing nicely 6/16; right lateral foot wound is still open. The wound looks better although there is still a lot of tenderness around it. Rather than more empiric antibiotics I have cultured this today. We are using PolyMem Ag. On the left lateral malleolus the wound appears better. He has a new area on the left lower anterior pretibial area 6/29; 2-week follow-up. Culture I did of the right lateral foot 2 weeks ago showed MRSA. I gave him 2 weeks of Septra which he should be just about finishing now and this seems better. We got a call from home health last week to report swelling in the left leg because we were listed as his primary. We referred him back to primary care. He arrives today with intense erythema and tenderness around the 2 wounds on the left medial ankle on the left lower calf. The area on the right lateral foot looks better 7/7; I brought him back this week to follow-up on his left foot which had cellulitis last week. I gave him clindamycin which as far as I know he tolerated. The erythema is better. He has a 3 current wounds one  on the right lateral foot a small area on the right lateral malleolus and the more recent area on the left anterior pretibial area. 7/21; bilateral distal lower leg and foot wounds. The area on his right lateral foot looks a lot better. He has an area on his left pretibial area distally. The area on the medial malleolus on the left which is 1 of his worst wounds has closed over. He does not appear to have any open areas in his remaining toes 8/4-Patient returns at 2 weeks, he has the remaining wound on the left lower leg the other 2 wounds have healed. Patient is continued to be treated with silver alginate with a 2 layer wrap on the left 8/18; I been following this patient for a long period for bilateral lower extremity wounds. 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 His wounds are probably  pressure although he religiously wears thick bunny boots. He does not have macrovascular disease but I did formal testing on him perhaps 2 or 3 years ago. I have often wondered about either cholesterol emboli or a vasculopathy of some sort given the distal nature of these variable locations etc. He has 2 areas that we have been following one on the left medial lower leg and one on the right lateral foot. Both of these are mostly healed today there is some eschar over sections of both areas although I did not disturb this. 03/31/2019 on evaluation today patient appears to be doing a little bit more poorly in regard to the sacral region. He has 2 small wounds noted at this point. Fortunately there is no signs of active infection at this time. No fevers, chills, nausea, vomiting, or diarrhea. He is having some pain when he sits on his gluteal region a big part of the reason he has these wounds is likely due to the fact that his wheelchair has not been functioning properly. He does still have a couple areas of deep tissue injury on his lower extremities he does need new Prevalon offloading boots as well. 04/28/2019 on evaluation today patient appears to be doing well with regard to his wounds in particular. His ankle area has fairly small based on what I am seeing currently and his sacral region actually is still open but does not appear to be doing too poorly in my opinion. Fortunately there is no signs of active infection at this time. He was in the hospital unfortunately and this was from 04/08/2019 through 04/12/2019 secondary to persistent diarrhea, dehydration and acute kidney injury. With that being said fortunately the patient does not seem to be doing too poorly at this time which is good news. He is having pain mainly in the sacral region. Electronic Signature(s) Signed: 04/29/2019 10:36:32 AM By: Lenda Kelp PA-C Entered By: Lenda Kelp on 04/28/2019  10:49:13 -------------------------------------------------------------------------------- Physical Exam Details Patient Name: Date of Service: DERRALL, HICKS 04/28/2019 9:45 AM Medical Record [id number] Patient Account Number: [id number] Date of Birth/Sex: Treating RN: April 18, 1961 (58 y.o. M) Primary Care Provider: Orpah Cobb Other Clinician: Referring Provider: Treating Provider/Extender:Stone III, Reginal Lutes, Orlie Pollen in Treatment: 17 Constitutional Well-nourished and well-hydrated in no acute distress. Respiratory normal breathing without difficulty. clear to auscultation bilaterally. Cardiovascular regular rate and rhythm with normal S1, S2. Psychiatric this patient is able to make decisions and demonstrates good insight into disease process. Alert and Oriented x 3. pleasant and cooperative. Notes Patient's wound bed currently on ankle actually  appears to be doing fairly well there is no signs of significant infection which is good news and though it is bleeding somewhat the area of opening is very small. With regard to the sacral region this likewise is actually doing quite well when it comes to the overall appearance of the wound bed. I do believe this is an area that can heal although it can take obvious a little time it is a little macerated I think we need to somewhat control this better as far as the moisture is concerned. Electronic Signature(s) Signed: 04/29/2019 10:36:32 AM By: Lenda Kelp PA-C Entered By: Lenda Kelp on 04/28/2019 10:50:58 -------------------------------------------------------------------------------- Physician Orders Details Patient Name: Date of Service: RANDI, COLLEGE 04/28/2019 9:45 AM Medical Record [id number] Patient Account Number: [id number] Date of Birth/Sex: Treating RN: [birth year] (58 y.o. [name] Primary Care Provider: Orpah Cobb Other Clinician: Referring Provider: Treating  Provider/Extender:Stone III, Reginal Lutes, Orlie Pollen in Treatment: 40 Verbal / Phone Orders: No Diagnosis Coding ICD-10 Coding Code Description L89.152 Pressure ulcer of sacral region, stage 2 Follow-up Appointments Return appointment in 1 month. Dressing Change Frequency Wound #34 Sacrum Change Dressing every other day. - or as needed Wound #35 Left,Medial Malleolus Change Dressing every other day. - or as needed Skin Barriers/Peri-Wound Care Barrier cream - to sacrum Moisturizing lotion - both legs daily Wound Cleansing Wound #34 Sacrum May shower and wash wound with soap and water. Wound #35 Left,Medial Malleolus May shower and wash wound with soap and water. Primary Wound Dressing Wound #34 Sacrum Calcium Alginate with Silver Wound #35 Left,Medial Malleolus Calcium Alginate with Silver Secondary Dressing Wound #34 Sacrum Foam Border Wound #35 Left,Medial Malleolus Foam Border Edema Control Elevate legs to the level of the heart or above for 30 minutes daily and/or when sitting, a frequency of: Support Garment 10-20 mm/Hg pressure to: - support hose both legs daily Off-Loading Multipodus Splint to: - sage boots both feet at all times. please float feet to offload pressure to lateral side of feet. Gel mattress overlay (Group 1) - for extended mattress/bed Turn and reposition every 2 hours Home Health Continue Home Health skilled nursing for wound care. Beverly Gust Electronic Signature(s) Signed: 04/28/2019 6:22:02 PM By: Zenaida Deed RN, BSN Signed: 04/29/2019 10:36:32 AM By: Lenda Kelp PA-C Entered By: Zenaida Deed on 04/28/2019 10:43:54 -------------------------------------------------------------------------------- Problem List Details Patient Name: Date of Service: DORRELL, MITCHELTREE 04/28/2019 9:45 AM Medical Record [id number] Patient Account Number: [id number] Date of Birth/Sex: Treating RN: Feb 17, 1961 (58 y.o. M) Primary Care Provider:  Orpah Cobb Other Clinician: Referring Provider: Treating Provider/Extender:Stone III, Suzan Garibaldi in Treatment: 37 Active Problems ICD-10 Evaluated Encounter Code Description Active Date Today Diagnosis L89.152 Pressure ulcer of sacral region, stage 2 03/31/2019 No Yes L89.522 Pressure ulcer of left ankle, stage 2 04/28/2019 No Yes I73.89 Other specified peripheral vascular diseases 08/26/2018 No Yes Inactive Problems ICD-10 Code Description Active Date Inactive Date 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 07/14/2017 07/14/2017 breakdown of skin L03.116 Cellulitis of left lower limb 08/26/2018 08/26/2018 L89.312 Pressure ulcer of right buttock, stage 2 12/25/2017 12/25/2017 L03.115 Cellulitis of right lower limb 12/25/2017 12/25/2017 L89.152 Pressure ulcer of sacral region, stage 2 12/25/2017 12/25/2017 L89.322 Pressure ulcer of left buttock, stage 2 12/25/2017 12/25/2017 L03.116 Cellulitis of left lower limb 11/23/2018 11/23/2018 Resolved Problems ICD-10 Code Description Active Date Resolved Date L97.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 12/25/2017 12/25/2017 layer exposed  Q22.979 Non-pressure chronic ulcer of left 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 05/01/2018 05/01/2018 L97.521 Non-pressure chronic ulcer of other part of left foot limited to 11/10/2018 11/10/2018 breakdown of skin Electronic Signature(s) Signed: 04/29/2019 10:36:32 AM By: Worthy Keeler PA-C Entered By: Worthy Keeler on 04/28/2019 10:52:53 -------------------------------------------------------------------------------- Progress Note Details Patient Name: Date of Service: HUMBLEHyden, Soley 04/28/2019 9:45 AM Medical Record [id number] Patient Account Number: [id number] Date of Birth/Sex: Treating RN: 01/24/1961 (58 y.o. M) Primary Care Provider: [name] Other Clinician: Referring Provider: Treating Provider/Extender:Stone III, Meredith Staggers in Treatment: 90 Subjective Chief Complaint Information obtained from Patient He is here for follow evaluation for ulcers to his left foot/malleolus 07/14/17; patient is here for multiple wounds on his bilateral feet and left ankle. History of Present Illness (HPI) 11/23/15; this is a patient I remember from a previous stay in this clinic in 2012. The patient says this was for a wound in this same area as currently although I have no information to verify that. Most of the information is provided by his caregiver from the group home where he resides. Apparently he has had a wound in this area for 8 months. He also has erythema around this area and has had multiple rounds of antibiotics apparently with some improvement but the erythema is persists. He apparently had a fracture in the ankle 2 months ago and was seen by Dr. Sharol Given . He apparently had a splint applied and more recently a heel offloading boot. He had home health coming out to a week ago not clear what they are dressing this with but they apparently discharged him perhaps because of one of Dr. 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 He is not a diabetic. Looking through [place] link he appears to have fairly active primary care. The area on the ankle is variably labeled as a pressure area and/or chronic venous insufficiency. He had a recent venous ultrasound on 5/25 that did not show a DVT in the left leg however this was not a reflux study. There was no superficial DVT either. Arterial studies on 10/15/14 showed a left ABI of 1.16 Center right of 1.12 waveforms were triphasic he does not have significant arterial disease. He has had recent x-rays of the left foot and ankle. The left foot did not show any abnormality. Left ankle x-ray showed a spiral fracture of the left tibial diaphysis  without evidence of osteomyelitis. 11/30/15 culture I did last week showed pseudomonas I changed him to oral ciprofloxacin. Erythema is a lot better. 12/07/15 area around the wound shows considerable improvement of the periwound erythema. 12/14/15; the patient has a improvement of the periwound erythema which in retrospect I think with cellulitis successfully treated. We have been using Iodoflex started last week 12/25/15 wound on the left medial malleolus and dorsal left foot. Theraskin #1 applied READMISSION 02/27/16; the patient was admitted to hospital from 8/14 through 01/18/16 initially felt to have cellulitis of his left lower leg however he was noted to have significant persistent pain and swelling. A duplex ultrasound was ordered that was negative. [place] an x-ray was done that showed a spiral fracture of the left leg. With posterior displacement and angulation at the mid left tibia. Orthopedics saw the patient and splinted the leg. He was then sent to [facility] skilled facility and only return to his group  home last week. He has wounds on the left medial malleolus, left dorsal foot and a worrisome area on the plantar left heel which is not really open but may represent a hematoma or a DTI 03/06/16; areas of left anterior and medial ankle both look improved. There are 3 small wounds here. He has a area over the dorsal left first metatarsal head which is not open. In meantime his left heel has opened 03/14/16 the medial left ankle wounds looks stable to improved. The area on the posterior left heel has opened up and had a very adherent eschar and slept over this 03/21/16 patient has 3 wounds on his left medial ankle is stable area on his left dorsal first metatarsal head appears to be improved. Continued problems with adherent eschar over the left heel 03/28/16; patient has 3 wounds on his left medial ankle and lower leg all of these appear to be stable. The problem continues to be the left  heel and the adherent eschar. This may have been a pressure ulcer from her brace at one point. He has a English as a second language teacher we have been using [employer] and all wound areas. Dressings are being changed by home health 04/04/16; the patient's areas on the medial aspect of the left ankle looks superficial and appear to be gradually closing. The area on the plantar aspects/Achilles aspect of his left heel as surface slough and nonviable subcutaneous tissue requiring debridement. 04/11/16; the areas on the patient's medial left ankle appear to be superficial and appear to be closing. The area on the plantar heel on the left has surface slough and nonviable subcutaneous tissue requiring debridement. I don't think this is too much different from last week there is also undermining 04/25/16; patient is followed in his group home by [facility] home health. The area on the plantar heel is smaller but with still with some depth. We're using Iodoflex to this. The areas on the left ankle looks healthy but dry and somewhat larger. Cause of this is not really clear using Prisma 05/02/16; areas x3 all look improved expecially the areas on the medial ankle. Heel wound is smaller but still deep 05/09/16; all areas o3 look improved on the medial ankle. Heel wound is also contracting. 12/0.9/17 the areas on his left medial ankle are all closed. I think these are largely chronic venous insufficiency however there is no edema. The area on his left heel was a pressure area. READMISSION 08/02/16; this man is a patient that we have had in this clinic on several times before. I believe he has cerebral palsy is nonambulatory. He has chronic venous insufficiency with venous inflammation. He was last year in the fall of 2017 with areas on his left medial ankle left heel and left dorsal foot. He arrives today with once again a vague history. As mentioned he is nonambulatory he spends most of his day in a pressure offloading boot  nevertheless they noted wounds in this area on the medial left ankle 2 weeks ago. There is any other history here I am uncertain 08/15/16; the patient's wounds on his medial ankle actually look better however the area on the left lateral/dorsal foot is covered in black necrotic eschar. Previous formal arterial studies did not show significant arterial disease with normal ABIs and triphasic waveforms. He is immobilized and at risk for pressure although he seems aware protective boots at all times as far as I am aware. 08/22/16; in general his wounds look better. All the wounds on  his medial left ankle look improved the area on his lateral foot however continues to be problematic. Patient comes in the clinic today in a new wheelchair he is quite delighted 09/05/16; a wound on the left medial Calf and left dorsal foot are healed. The area on the left lateral foot is improved however the area on the left medial malleolus is inflamed with a considerable degree of surrounding erythema. This looks like cellulitis. ABIs and clinical exam are within normal limits 09/12/16; the patient continues to have a wound on the left medial calf. I was concerned about cellulitis in this area last week and gave him a course of doxycycline. The wound looks better here. The area on the left lateral foot however is unchanged 09/20/16; the patient came in this week early after traumatizing his toe on the left foot on a table while moving around in his motorized wheelchair. 09/26/16- he is here for follow-up evaluation of his left foot and malleolus ulcers. Since his appointment he abraded his left medial hallux against a table while in his motorized wheelchair, apparently trying to pull up to the table for a meal. He continues to complain of tenderness to the left lateral foot, surrounding the current ulcer 10/07/16; left foot and malleolus ulcers. 10/14/16; the patient is doing well. He has a wound on the left lateral, left  medial malleolus and atraumatic wound on the left great toe. Although these appear to be superficial and closing 10/31/16; the patient is doing well. The area on the left lateral foot, left medial malleolus and the left great toe or all epithelialized. He has home health 11/14/16; patient arrives today with the area on the left great toe healed. The left lateral foot however in the left medial malleolus and the ankle superficial wounds that are slightly larger. He has erythema and tenderness around the area on the left lateral foot. As well he is complaining of dysuria. His caregiver notes that when he gets like this he is more lethargic which indeed seems to be the case today 11/21/16; left great toe is still healed. His wound on the left lateral foot appears healthy and slightly smaller also the area on the left medial ankle. This had black eschar on it which I removed. The remaining wound looks satisfactory 12/12/16; patient hasn't been here in a number of weeks. The area on the left lateral foot is healed however on his medial ankle has 2 open areas one of them covered with a necrotic eschar. He also has a new wound on the right second toe which was apparently trauma induced while he was at a movie 12/27/16; the patient has wounds on his left medial mallelus and left lateral foot that are just about closed. The area on the right second toe which was trauma from last time is still open but again everything looks a lot better here. 01/30/17; patient arrives in clinic today and is really been a long time since I've seen him. The wounds are all epithelialized on the left foot this includes his left lateral foot, left medial malleolus. Apparently was in the ER for what of I think was a fungal infection on his buttock's he was prescribed Diflucan and doxycycline I think this is already getting better. READMISSION Kharson is a now 58 year old man who has advanced cerebral palsy and functional quadriplegia. We  have had him in this clinic multiple times before for wounds on his right ankle, left ankle, left dorsal foot and most recently from  08/02/16 through 01/30/17 with his left medial ankle and left foot and finally right second toe. His attendant from the group home where he lives is very knowledgeable about him. She states that they noticed breakdown in his skin in his feet sometime in January although they could not get an appointment until today in this clinic. The previous wounds have always been felt to be secondary to incidental trauma/pressure areas or possibly some degree of chronic venous insufficiency. He had normal arterial studies in may of 2016. He has definite open wounds on the right lateral foot over the base of the fifth metatarsal, the right second toe DIP, the left medial malleolus. He has concerning areas over the left lateral fifth metatarsal base, retaform purpura over the medial left ankle and perhaps the medial left first toe. 07/21/17; culture we did last week from the base of the right fifth metatarsal grew group G strep. This should've been sensitive to the doxycycline I gave him. The other major wounds are on the right second toe DIP, left medial malleolus, base of the left lateral fifth metatarsal and the medial left ankle and medial left toe. 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 We have macrovascular arterial studies scheduled for Thursday although I don't think this is what the problem is. 07/28/17; patient arrives with really no improvement. He has a wound on the right second toe, lateral right foot, right medial malleolus which was new this week, left medial malleolus, but looked like a DTI on the left great toe. His macrovascular studies are on 07/30/17 although I would be surprised if this provides all the explanation. He has small areas even on his right second and third toe which look like splinter-type hemorrhages. The question I have is where is this  coming from. I'd like to see the vascular studies however before we pursue this. He has had a DVT in the past but is no longer on anticoagulants. I'll need to review his history 08/04/17; the patient had his ARTERIAL STUDIES this showed a right ABI of 1.16 and the left ABI of 1.20. TBI on the left at 0.84. He is at a right first toe amputation. The studies were unchanged from studies in May 2016. It was not felt that he had significant lower extremity arterial disease In the meantime we are using silver alginate to the wounds on the right second toe right lateral foot left medial malleolus. I'm going to change to Silver collagen today. 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 Working him up for this would include an echocardiogram probably a CT scan of the chest and abdomen to look at the major thoracic and abdominal aortic vessels. I am not going to involve myself in this unless there is more symptoms/signs 08/18/17; the patient arrives today with wounds looking somewhat better. The remaining areas are the right second toe dorsal, right lateral foot and left medial malleolus which only has a small open area. We've been using silver collagen 09/01/17;right second toe dorsally probes to bone today. This is a deterioration. We've been using silver collagen. Only a small open area remains on the left medial malleolus. 09/09/17; right second toe appears better. There is no probing bone. He has a small area remaining on the left medial malleolus, the right lateral foot. We have been using silver collagen 10/07/17; the patient returns to clinic today after a month hiatus.  He has wounds over the right second toe, right lateral foot and the left medial malleolus. We have been using silver collagen. He has home health. 10/24/17; patient has open wounds over the right second toe, now  the left fourth toe and a superficial area over his left buttock. Some of the other areas have healed including the left medial malleolus and right lateral foot. Using silver collagen on these wounds 11/07/17; the patient has closed his wounds on the right second toe and left fourth toe although they look all normal. Last time he was here he had a superficial area over the left buttock this is also closed. The right lateral foot wound I had closed out last time although it is seems to have reopened today. 628/19 the areas over the right second and left fourth toe remains closed. The right lateral foot has tightly adherent necrotic surface over. We have been using silver collagen he offloads this and a bunny boot 12/12/17; the patient has reopened the recurrent area over the right second PIP joint. 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 He does not have obvious macrovascular disease. The area over the right lateral foot looks about the same as last time tightly adherent necrotic debris. We've been using Hydrofera Blue 12/25/17; 2 week follow-up. The area over the right second PIP is closed however the area on the right lateral foot is worse with surrounding erythema and pain compatible with cellulitis is going to need an antibiotic. ooAS WELL..... We have looked at his buttocks and lower back. He has 3 stage II ulcers. The exact history here is unclear however he is very disabled man with cerebral palsy. He goes to a day program from the group home in which she lives 5 days a week and I think he is on his buttock all day on these visits. Surrounding the wounds see has extensive stage I injury as well. ooFINALLY it looks as though he has lost weight. I'm not the only staff member that is recognized this 01/08/18 on evaluation today patient appears to be doing better in regard to his gluteal wounds. In fact everything appears to be healed  back here although very dry. He has a couple open sores on the scrotal region other than this it is really his foot that is still giving him the most trouble. He did complete the Keflex unfortunately it seems like there's still a lot of erythema in this in fact looks worse than it did previous. I'm gonna culture this today and likely start with a different antibiotic. 01/16/18; I was reassured that the wounds on his buttocks and sacrum are healed. I did not look at these the day. The culture that was done last week on 01/11/18 of the right lateral foot wounds showed Pseudomonas. Bactrim is not a good choice for this. Ciprofloxacin interacts with his muscle relaxants therefore I prescribed cefdinir 300 twice a day for 7 days. Bactrim coverage of pseudomonas is not reliable although the degree of erythema looks a lot better 01/30/18; the patient has completed his antibiotics. The area does not have infection on the right lateral foot. The wounds look a lot better. We've been using silver alginate 02/12/18; there is on the right lateral foot. Copious amounts of surface eschar. Also similarly over the PIP of the right second toe. We've been using silver alginate to date 03/02/2018; the area that remains is on the right lateral foot.  The right second toe is healed. 03/16/2018; the area there is original wound on the right lateral foot. Still requiring debridement. We are using Hydrofera Blue. The right second toe is still healed/closed 03/30/2018; right lateral foot there is 2 small open areas remaining. They actually look quite healthy we have been using Hydrofera Blue the right second toe remains closed 04/13/2018; right lateral foot. 1 of the 2 small open areas from last time has closed over. The other had surface slough that I removed with a #3 curette. This looks quite healthy. The right second toe remains closed. We are using Hydrofera Blue 05/01/2018; right lateral foot very superficial areas that  look like they are trying to close over. He had a new threatened area on the left medial ankle just below the medial malleolus. I think these represent chronic venous insufficiency areas 05/14/2018; the right right lateral foot looks like it closed over as was predicted last time however he has some dark areas subcutaneously erythema superiorly and this is really very tender. This is either coexistent cellulitis or perhaps a deep tissue injury. The area on the left medial ankle is superficial but has some size. We have been using Hydrofera Blue to both areas. The patient complains of pain in his right foot 06/09/2018; right lateral foot was closed over last time but there was coexistent cellulitis around this. I gave him Levaquin. He also has an area on the left medial malleolus. He has not been back to clinic in over 3-1/2 weeks. We are using Hydrofera Blue I think to the left medial malleolus and silver alginate to the right lateral foot 1/21; right lateral foot still non-viable debris over this area and the left medial malleolus. Both of these vigorously washed off with antiseptic gauze. We have been using silver alginate. 2/4; right lateral foot this looks somewhat better. Unfortunately the area over the left medial malleolus has deteriorated. Using silver alginate in both area 2/18; both wounds arrived today covered in a lot of necrotic debris. We have been using silver alginate. Clearly not making a lot of progress. 3/3; the patient's right second toe is reopened over the PIP. I am not sure why this is happening. He may have microvascular disease. We have checked his macrovascular flow in the past and found it to be normal. He may need an x-ray. The area on the right lateral foot requires debridement. The area on the left lateral ankle is worse. We have been using PolyMem Ag 08/26/18 on evaluation today patient appears to be doing a little worse in regard to his lower extremities based  on what I'm seeing what the nursing staff is telling me. His left medial ankle appears potential to be infected there is your thing on want surrounding the wound. His right second toe is also of concern at this point in my pinion. We have not done x-rays of these areas that may be a good idea to go ahead and proceed with at this point to ensure will not deal with anything such as osteomyelitis. 4/16; it is been a while since I saw this patient. He now has wounds on the right lateral foot, dorsal right second toe, substantially on the left medial malleolus and a small area on the left lateral foot. We have been using PolyMem Silver. Curlex and Coban. He has home health changing the dressing. Since last time he was here he had a culture of the left medial ankle that showed MRSA. He is completed a  course/7 days of doxycycline. XRAYS of the left ankle and right foot that were ordered were negative for osteomyelitis 4/30; he arrives in clinic today with some maceration around the wound and a lot of tenderness and erythema in a circumferential area around the right lateral foot. He has wound areas on the right lateral foot dorsal right second toe, largely over the left medial malleolus and a small area over the left lateral foot 5/7; completed his antibiotics from last time right lateral foot cellulitis is better and the area on his left knee which we discovered after he had been seen by the provider also is improved. He still has extensive wound areas over the right lateral foot, left medial malleolus, right second toe and left lateral foot although all of these look somewhat better with PolyMem 5/15. Wounds on the right lateral foot, right second toe left medial malleolus and the left lateral foot. All of these look somewhat better. We have been using PolyMem Ag 5/26; the wound on the right lateral foot is almost closed over but he still complains of a lot of pain in this area. He has a eschared  area on the right second toe but no open wound.Geoffry Paradise concerning today his original wound on the left medial malleolus looks healthy however just inferior to this almost areas of deep tissue injury and there is an area on the left lateral foot also looks like a deep tissue injury. In the past I have wondered whether he might have cholesterol emboli or some other form of a vasculopathy. I wonder whether this could have something to do with this. 6/2; right lateral foot wound is still open I gave him empiric antibiotics last week without really a lot of change she is still complaining of pain in this area. We x-rayed his feet at some point in April these were negative. It would be difficult to do an MRI. The area on his left medial malleolus looks better. Right second toe is healed. He still has what looks to be a small pressure related area on the left lateral foot but it is not open. We are using PolyMem to all wound which seems to be doing nicely 6/16; right lateral foot wound is still open. The wound looks better although there is still a lot of tenderness around it. Rather than more empiric antibiotics I have cultured this today. We are using PolyMem Ag. On the left lateral malleolus the wound appears better. He has a new area on the left lower anterior pretibial area 6/29; 2-week follow-up. Culture I did of the right lateral foot 2 weeks ago showed MRSA. I gave him 2 weeks of Septra which he should be just about finishing now and this seems better. We got a call from home health last week to report swelling in the left leg because we were listed as his primary. We referred him back to primary care. He arrives today with intense erythema and tenderness around the 2 wounds on the left medial ankle on the left lower calf. The area on the right lateral foot looks better 7/7; I brought him back this week to follow-up on his left foot which had cellulitis last week. I gave him clindamycin which  as far as I know he tolerated. The erythema is better. He has a 3 current wounds one on the right lateral foot a small area on the right lateral malleolus and the more recent area on the left anterior pretibial area. 7/21; bilateral  distal lower leg and foot wounds. The area on his right lateral foot looks a lot better. He has an area on his left pretibial area distally. The area on the medial malleolus on the left which is 1 of his worst wounds has closed over. He does not appear to have any open areas in his remaining toes 8/4-Patient returns at 2 weeks, he has the remaining wound on the left lower leg the other 2 wounds have healed. Patient is continued to be treated with silver alginate with a 2 layer wrap on the left 8/18; I been following this patient for a long period for bilateral lower extremity wounds. 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 His wounds are probably pressure although he religiously wears thick bunny boots. He does not have macrovascular disease but I did formal testing on him perhaps 2 or 3 years ago. I have often wondered about either cholesterol emboli or a vasculopathy of some sort given the distal nature of these variable locations etc. He has 2 areas that we have been following one on the left medial lower leg and one on the right lateral foot. Both of these are mostly healed today there is some eschar over sections of both areas although I did not disturb this. 03/31/2019 on evaluation today patient appears to be doing a little bit more poorly in regard to the sacral region. He has 2 small wounds noted at this point. Fortunately there is no signs of active infection at this time. No fevers, chills, nausea, vomiting, or diarrhea. He is having some pain when he sits on his gluteal region a big part of the reason he has these wounds is likely due to the fact that his wheelchair has not been functioning properly. He  does still have a couple areas of deep tissue injury on his lower extremities he does need new Prevalon offloading boots as well. 04/28/2019 on evaluation today patient appears to be doing well with regard to his wounds in particular. His ankle area has fairly small based on what I am seeing currently and his sacral region actually is still open but does not appear to be doing too poorly in my opinion. Fortunately there is no signs of active infection at this time. He was in the hospital unfortunately and this was from 04/08/2019 through 04/12/2019 secondary to persistent diarrhea, dehydration and acute kidney injury. With that being said fortunately the patient does not seem to be doing too poorly at this time which is good news. He is having pain mainly in the sacral region. Patient History Information obtained from Patient. Social History Never smoker, Marital Status - Single, Alcohol Use - Never, Drug Use - No History, Caffeine Use - Moderate. Medical History Eyes Denies history of Cataracts, Glaucoma, Optic Neuritis Ear/Nose/Mouth/Throat Denies history of Chronic sinus problems/congestion, Middle ear problems Cardiovascular Patient has history of Deep Vein Thrombosis Gastrointestinal Patient has history of Colitis Endocrine Denies history of Type I Diabetes, Type II Diabetes Genitourinary Denies history of End Stage Renal Disease Immunological Denies history of Lupus Erythematosus, Raynaudoos, Scleroderma Musculoskeletal Patient has history of Osteomyelitis Neurologic Patient has history of Neuropathy, Quadriplegia - spastic Oncologic Denies history of Received Chemotherapy, Received Radiation Psychiatric Patient has history of Confinement Anxiety Denies history of Anorexia/bulimia Hospitalization/Surgery History - surgery to straughten legs. 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 - hiatal hernia repair. - (R) gt toe amputation. - Constipation. Medical And Surgical History  Notes Co-Morbid Conditions  h/o spiral fracture distal (L) tibia , h/o shingles , MR , infantile CP Ear/Nose/Mouth/Throat hiatal hernia Respiratory h/o pneumonia , chronic bronchitis Cardiovascular hypercholesterolemia Gastrointestinal acute gastric ulcer with hemorrhage , constipation , GERD , Abdominal Distention Endocrine recurrent UTI's Genitourinary incontinence (on Detrol) Renal Glycosuria Integumentary (Skin) Eczema Acne Vulgaris Onychomycosis Pressure Ulcers Musculoskeletal DJD of shoulder Neurologic cerebral palsy Psychiatric general anxiety , bipolar , depression, Mental Retardation Review of Systems (ROS) Constitutional Symptoms (General Health) Denies complaints or symptoms of Fatigue, Fever, Chills, Marked Weight Change. Respiratory Denies complaints or symptoms of Chronic or frequent coughs, Shortness of Breath. Cardiovascular Denies complaints or symptoms of Chest pain. Psychiatric Denies complaints or symptoms of Claustrophobia, Suicidal. Objective Constitutional Well-nourished and well-hydrated in no acute distress. Vitals Time Taken: 10:12 AM, Height: 73 in, Weight: 178 lbs, BMI: 23.5, Temperature: 97.8 F, Pulse: 83 bpm, Respiratory Rate: 20 breaths/min, Blood Pressure: 137/84 mmHg. Respiratory normal breathing without difficulty. clear to auscultation bilaterally. Cardiovascular regular rate and rhythm with normal S1, S2. Psychiatric this patient is able to make decisions and demonstrates good insight into disease process. Alert and Oriented x 3. pleasant and cooperative. General Notes: Patient's wound bed currently on ankle actually appears to be doing fairly well there is no signs of significant infection which is good news and though it is bleeding somewhat the area of opening is very small. With regard to the sacral region this likewise is actually doing quite well when it comes to the overall appearance of the wound bed. I do believe this is an  area that can heal although it can take obvious a little time it is a little macerated I think we need to somewhat control this better as far as the moisture is concerned. Integumentary (Hair, Skin) Wound #34 status is Open. Original cause of wound was Pressure Injury. The wound is located on the Sacrum. The wound measures 0.2cm length x 0.4cm width x 0.1cm depth; 0.063cm^2 area and 0.006cm^3 volume. There is Fat Layer (Subcutaneous Tissue) Exposed exposed. There is no tunneling or undermining noted. There is a small amount of serosanguineous drainage noted. The wound margin is flat and intact. There is large (67-100%) pink granulation within the wound bed. There is no necrotic tissue within the wound bed. Wound #35 status is Open. Original cause of wound was Pressure Injury. The wound is located on the Left,Medial Malleolus. The wound measures 0.8cm length x 0.8cm width x 0.1cm depth; 0.503cm^2 area and 0.05cm^3 volume. There is no tunneling or undermining noted. There is a medium amount of serosanguineous drainage noted. The wound margin is flat and intact. There is large (67-100%) red granulation within the wound bed. There is no necrotic tissue within the wound bed. Assessment Active Problems ICD-10 Pressure ulcer of sacral region, stage 2 Pressure ulcer of left ankle, stage 2 Other specified peripheral vascular diseases Plan Follow-up Appointments: Return appointment in 1 month. Dressing Change Frequency: Wound #34 Sacrum: Change Dressing every other day. - or as needed Wound #35 Left,Medial Malleolus: Change Dressing every other day. - or as needed Skin Barriers/Peri-Wound Care: Barrier cream - to sacrum Moisturizing lotion - both legs daily Wound Cleansing: Wound #34 Sacrum: May shower and wash wound with soap and water. Wound #35 Left,Medial Malleolus: May shower and wash wound with soap and water. Primary Wound Dressing: Wound #34 Sacrum: Calcium Alginate with  Silver Wound #35 Left,Medial Malleolus: Calcium Alginate with Silver Secondary Dressing: Wound #34 Sacrum: Foam Border Wound #35 Left,Medial Malleolus: Foam Border Edema Control: Elevate  legs to the level of the heart or above for 30 minutes daily and/or when sitting, a frequency of: Support Garment 10-20 mm/Hg pressure to: - support hose both legs daily Off-Loading: Multipodus Splint to: - sage boots both feet at all times. please float feet to offload pressure to lateral side of feet. Gel mattress overlay (Group 1) - for extended mattress/bed Turn and reposition every 2 hours Home Health: Continue Home Health skilled nursing for wound care. - [facility] I would recommend currently that we actually reinitiate the silver alginate dressings for both wound locations and the patient is in agreement with this plan. 2. Open a suggest as well that we go ahead and continue with the bordered foam dressing for the sacral region as I am hopeful this will continue to keep this area padded and protected. 3. In regard to the ankle ulcer he still continue to use the offloading pressure relief Prevalon boots when in bed which is helpful obviously. I think that still a bordered foam dressing here can be of benefit. 4. As far as offloading for the sacral region I think he needs to reposition every 2 hours and he does have a gel mattress overlay. We will see patient back for reevaluation in 1 month here in the clinic. If anything worsens or changes patient will contact our office for additional recommendations. Electronic Signature(s) Signed: 04/29/2019 10:36:32 AM By: Lenda Kelp PA-C Entered By: Lenda Kelp on 04/28/2019 10:53:05 -------------------------------------------------------------------------------- HxROS Details Patient Name: Date of Service: Beretta, [name] 04/28/2019 9:45 AM Medical Record [id number] Patient Account Number: [id number] Date of Birth/Sex: Treating  RN: [birth year] (58 y.o. M) Primary Care Provider: Orpah Cobb Other Clinician: Referring Provider: Treating Provider/Extender:Stone III, Suzan Garibaldi in Treatment: 78 Information Obtained From Patient Constitutional Symptoms (General Health) Complaints and Symptoms: Negative for: Fatigue; Fever; Chills; Marked Weight Change Respiratory Complaints and Symptoms: Negative for: Chronic or frequent coughs; Shortness of Breath Medical History: Past Medical History Notes: h/o pneumonia , chronic bronchitis Cardiovascular Complaints and Symptoms: Negative for: Chest pain Medical History: Positive for: Deep Vein Thrombosis Past Medical History Notes: hypercholesterolemia Psychiatric Complaints and Symptoms: Negative for: Claustrophobia; Suicidal Medical History: Positive for: Confinement Anxiety Negative for: Anorexia/bulimia Past Medical History Notes: general anxiety , bipolar , depression, Mental Retardation Co-Morbid Conditions Medical History: Past Medical History Notes: h/o spiral fracture distal (L) tibia , h/o shingles , MR , infantile CP Eyes Medical History: Negative for: Cataracts; Glaucoma; Optic Neuritis Ear/Nose/Mouth/Throat Medical History: Negative for: Chronic sinus problems/congestion; Middle ear problems Past Medical History Notes: hiatal hernia Gastrointestinal Medical History: Positive for: Colitis Past Medical History Notes: acute gastric ulcer with hemorrhage , constipation , GERD , Abdominal Distention Endocrine Medical History: Negative for: Type I Diabetes; Type II Diabetes Past Medical History Notes: recurrent UTI's Genitourinary Medical History: Negative for: End Stage Renal Disease Past Medical History Notes: incontinence (on Detrol) Renal Glycosuria Immunological Medical History: Negative for: Lupus Erythematosus; Raynauds; Scleroderma Integumentary (Skin) Medical History: Past Medical History  Notes: Eczema Acne Vulgaris Onychomycosis Pressure Ulcers Musculoskeletal Medical History: Positive for: Osteomyelitis Past Medical History Notes: DJD of shoulder Neurologic Medical History: Positive for: Neuropathy; Quadriplegia - spastic Past Medical History Notes: cerebral palsy Oncologic Medical History: Negative for: Received Chemotherapy; Received Radiation Immunizations Pneumococcal Vaccine: Received Pneumococcal Vaccination: Yes Implantable Devices No devices added Hospitalization / Surgery History Type of Hospitalization/Surgery surgery to straughten legs 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 Electronic Signature(s) Signed: 04/29/2019 10:36:32 AM By: Lenda Kelp PA-C Entered By: Lenda Kelp on 04/28/2019 10:50:39 -------------------------------------------------------------------------------- SuperBill Details Patient Name: Date of Service: FISCHER, [name] 04/28/2019 Medical Record [id number] Patient Account Number: [id number] Date of Birth/Sex: Treating RN: May 03, 1961 (58 y.o. [name] Primary Care Provider: Orpah Cobb Other Clinician: Referring Provider: Treating Provider/Extender:Stone III, Reginal Lutes, Orlie Pollen in Treatment: 11 Diagnosis Coding ICD-10 Codes Code Description L89.152 Pressure ulcer of sacral region, stage 2 L89.522 Pressure ulcer of left ankle, stage 2 I73.89 Other specified peripheral vascular diseases Facility Procedures CPT4 Code: [id number] Description: 99214 - WOUND CARE VISIT-LEV 4 EST PT Modifier: Quantity: 1 Physician  Procedures CPT4 Code: 6213086 Description: 99214 - WC PHYS LEVEL 4 - EST PT ICD-10 Diagnosis Description L89.152 Pressure ulcer of sacral region, stage 2 L89.522 Pressure ulcer of left ankle, stage 2 I73.89 Other specified peripheral vascular diseases Modifier: Quantity: 1 Electronic Signature(s) Signed: 04/29/2019 10:36:32 AM By: Lenda Kelp PA-C Entered By: Lenda Kelp on 04/28/2019 10:53:21

## 2019-05-04 NOTE — Progress Notes (Signed)
KENI, ELISON ([id number]) Visit Report for 01/12/2019 HPI Details Patient Name: Date of Service: [name], [name] 01/12/2019 9:00 AM Medical Record [id number] Patient Account Number: [id number] Date of Birth/Sex: Treating RN: 1960-09-06 (57 y.o. [name]) [name] Primary Care Provider: Orpah Cobb Other Clinician: Referring Provider: Treating Provider/Extender:Robson, Dorna Leitz, Orlie Pollen in Treatment: 74 History of Present Illness HPI Description: 11/23/15; this is a patient I remember from a previous stay in this clinic in 2012. The patient says this was for a wound in this same area as currently although I have no information to verify that. Most of the information is provided by his caregiver from the group home where he resides. Apparently he has had a wound in this area for 8 months. He also has erythema around this area and has had multiple rounds of antibiotics apparently with some improvement but the erythema is persists. He apparently had a fracture in the ankle 2 months ago and was seen by Dr. Lajoyce Corners . He apparently had a splint applied and more recently a heel offloading boot. He had home health coming out to a week ago not clear what they are dressing this with but they apparently discharged him perhaps because of one of Dr. 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 He has a Nurse, adult for transferring at home he is nonambulatory and does not wear footwear. He is not a diabetic. Looking through [facility] he appears to have fairly active primary care. The area on the ankle is variably labeled as a pressure area and/or chronic venous insufficiency. He had a recent venous ultrasound on 5/25 that did not show a DVT in the left leg however this was not a reflux study. There was no superficial DVT either. Arterial studies on 10/15/14 showed a left ABI of 1.16 Center right of 1.12 waveforms were triphasic he does not  have significant arterial disease. He has had recent x-rays of the left foot and ankle. The left foot did not show any abnormality. Left ankle x-ray showed a spiral fracture of the left tibial diaphysis without evidence of osteomyelitis. 11/30/15 culture I did last week showed pseudomonas I changed him to oral ciprofloxacin. Erythema is a lot better. 12/07/15 area around the wound shows considerable improvement of the periwound erythema. 12/14/15; the patient has a improvement of the periwound erythema which in retrospect I think with cellulitis successfully treated. We have been using Iodoflex started last week 12/25/15 wound on the left medial malleolus and dorsal left foot. Theraskin #1 applied READMISSION 02/27/16; the patient was admitted to hospital from 8/14 through 01/18/16 initially felt to have cellulitis of his left lower leg however he was noted to have significant persistent pain and swelling. A duplex ultrasound was ordered that was negative. Obie Dredge an x-ray was done that showed a spiral fracture of the left leg. With posterior displacement and angulation at the mid left tibia. Orthopedics saw the patient and splinted the leg. He was then sent to [facility] skilled facility and only return to his group home last week. He has wounds on the left medial malleolus, left dorsal foot and a worrisome area on the plantar left heel which is not really open but may represent a hematoma or a DTI 03/06/16; areas of left anterior and medial ankle both look improved. There are 3 small wounds here. He has a area over the dorsal left first metatarsal head which is not open. In meantime his left heel has opened  03/14/16 the medial left ankle wounds looks stable to improved. The area on the posterior left heel has opened up and had a very adherent eschar and slept over this 03/21/16 patient has 3 wounds on his left medial ankle is stable area on his left dorsal first metatarsal head appears to be  improved. Continued problems with adherent eschar over the left heel 03/28/16; patient has 3 wounds on his left medial ankle and lower leg all of these appear to be stable. The problem continues to be the left heel and the adherent eschar. This may have been a pressure ulcer from her brace at one point. He has a English as a second language teacher we have been using [employer] and all wound areas. Dressings are being changed by home health 04/04/16; the patient's areas on the medial aspect of the left ankle looks superficial and appear to be gradually closing. The area on the plantar aspects/Achilles aspect of his left heel as surface slough and nonviable subcutaneous tissue requiring debridement. 04/11/16; the areas on the patient's medial left ankle appear to be superficial and appear to be closing. The area on the plantar heel on the left has surface slough and nonviable subcutaneous tissue requiring debridement. I don't think this is too much different from last week there is also undermining 04/25/16; patient is followed in his group home by [facility]. The area on the plantar heel is smaller but with still with some depth. We're using Iodoflex to this. The areas on the left ankle looks healthy but dry and somewhat larger. Cause of this is not really clear using Prisma 05/02/16; areas x3 all look improved expecially the areas on the medial ankle. Heel wound is smaller but still deep 05/09/16; all areas 3 look improved on the medial ankle. Heel wound is also contracting. 12/0.9/17 the areas on his left medial ankle are all closed. I think these are largely chronic venous insufficiency however there is no edema. The area on his left heel was a pressure area. READMISSION 08/02/16; this man is a patient that we have had in this clinic on several times before. I believe he has cerebral palsy is nonambulatory. He has chronic venous insufficiency with venous inflammation. He was last year in the fall of 2017 with  areas on his left medial ankle left heel and left dorsal foot. He arrives today with once again a vague history. As mentioned he is nonambulatory he spends most of his day in a pressure offloading boot nevertheless they noted wounds in this area on the medial left ankle 2 weeks ago. There is any other history here I am uncertain 08/15/16; the patient's wounds on his medial ankle actually look better however the area on the left lateral/dorsal foot is covered in black necrotic eschar. Previous formal arterial studies did not show significant arterial disease with normal ABIs and triphasic waveforms. He is immobilized and at risk for pressure although he seems aware protective boots at all times as far as I am aware. 08/22/16; in general his wounds look better. All the wounds on his medial left ankle look improved the area on his lateral foot however continues to be problematic. Patient comes in the clinic today in a new wheelchair he is quite delighted 09/05/16; a wound on the left medial Calf and left dorsal foot are healed. The area on the left lateral foot is improved however the area on the left medial malleolus is inflamed with a considerable degree of surrounding erythema. This looks like cellulitis.  ABIs and clinical exam are within normal limits 09/12/16; the patient continues to have a wound on the left medial calf. I was concerned about cellulitis in this area last week and gave him a course of doxycycline. The wound looks better here. The area on the left lateral foot however is unchanged 09/20/16; the patient came in this week early after traumatizing his toe on the left foot on a table while moving around in his motorized wheelchair. 09/26/16- he is here for follow-up evaluation of his left foot and malleolus ulcers. Since his appointment he abraded his left medial hallux against a table while in his motorized wheelchair, apparently trying to pull up to the table for a meal. He continues to  complain of tenderness to the left lateral foot, surrounding the current ulcer 10/07/16; left foot and malleolus ulcers. 10/14/16; the patient is doing well. He has a wound on the left lateral, left medial malleolus and atraumatic wound on the left great toe. Although these appear to be superficial and closing 10/31/16; the patient is doing well. The area on the left lateral foot, left medial malleolus and the left great toe or all epithelialized. He has home health 11/14/16; patient arrives today with the area on the left great toe healed. The left lateral foot however in the left medial malleolus and the ankle superficial wounds that are slightly larger. He has erythema and tenderness around the area on the left lateral foot. As well he is complaining of dysuria. His caregiver notes that when he gets like this he is more lethargic which indeed seems to be the case today 11/21/16; left great toe is still healed. His wound on the left lateral foot appears healthy and slightly smaller also the area on the left medial ankle. This had black eschar on it which I removed. The remaining wound looks satisfactory 12/12/16; patient hasn't been here in a number of weeks. The area on the left lateral foot is healed however on his medial ankle has 2 open areas one of them covered with a necrotic eschar. He also has a new wound on the right second toe which was apparently trauma induced while he was at a movie 12/27/16; the patient has wounds on his left medial mallelus and left lateral foot that are just about closed. The area on the right second toe which was trauma from last time is still open but again everything looks a lot better here. 01/30/17; patient arrives in clinic today and is really been a long time since I've seen him. The wounds are all epithelialized on the left foot this includes his left lateral foot, left medial malleolus. Apparently was in the ER for what of I think was a fungal infection on his  buttock's he was prescribed Diflucan and doxycycline I think this is already getting better. READMISSION [name] is a now 58 year old man who has advanced cerebral palsy and functional quadriplegia. We have had him in this clinic multiple times before for wounds on his right ankle, left ankle, left dorsal foot and most recently from 08/02/16 through 01/30/17 with his left medial ankle and left foot and finally right second toe. His attendant from the group home where he lives is very knowledgeable about him. She states that they noticed breakdown in his skin in his feet sometime in January although they could not get an appointment until today in this clinic. The previous wounds have always been felt to be secondary to incidental trauma/pressure areas or possibly some degree  of chronic venous insufficiency. He had normal arterial studies in may of 2016. He has definite open wounds on the right lateral foot over the base of the fifth metatarsal, the right second toe DIP, the left medial malleolus. He has concerning areas over the left lateral fifth metatarsal base, retaform purpura over the medial left ankle and perhaps the medial left first toe. 07/21/17; culture we did last week from the base of the right fifth metatarsal grew group G strep. This should've been sensitive to the doxycycline I gave him. The other major wounds are on the right second toe DIP, left medial malleolus, base of the left lateral fifth metatarsal and the medial left ankle and medial left toe. 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 We have macrovascular arterial studies scheduled for Thursday although I don't think this is what the problem is. 07/28/17; patient arrives with really no improvement. He has a wound on the right second toe, lateral right foot, right medial malleolus which was new this week, left medial malleolus, but looked like a DTI on the left great toe. His macrovascular studies are on 07/30/17  although I would be surprised if this provides all the explanation. He has small areas even on his right second and third toe which look like splinter-type hemorrhages. The question I have is where is this coming from. I'd like to see the vascular studies however before we pursue this. He has had a DVT in the past but is no longer on anticoagulants. I'll need to review his history 08/04/17; the patient had his ARTERIAL STUDIES this showed a right ABI of 1.16 and the left ABI of 1.20. TBI on the left at 0.84. He is at a right first toe amputation. The studies were unchanged from studies in May 2016. It was not felt that he had significant lower extremity arterial disease In the meantime we are using silver alginate to the wounds on the right second toe right lateral foot left medial malleolus. I'm going to change to Silver collagen today. 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 Working him up for this would include an echocardiogram probably a CT scan of the chest and abdomen to look at the major thoracic and abdominal aortic vessels. I am not going to involve myself in this unless there is more symptoms/signs 08/18/17; the patient arrives today with wounds looking somewhat better. The remaining areas are the right second toe dorsal, right lateral foot and left medial malleolus which only has a small open area. We've been using silver collagen 09/01/17;right second toe dorsally probes to bone today. This is a deterioration. We've been using silver collagen. Only a small open area remains on the left medial malleolus. 09/09/17; right second toe appears better. There is no probing bone. He has a small area remaining on the left medial malleolus, the right lateral foot. We have been using silver collagen 10/07/17; the patient returns to clinic today after a month hiatus. He has  wounds over the right second toe, right lateral foot and the left medial malleolus. We have been using silver collagen. He has home health. 10/24/17; patient has open wounds over the right second toe, now the left fourth toe and a superficial area over his left buttock. Some of the other areas have healed including the left medial malleolus and right lateral foot. Using silver collagen on these wounds 11/07/17; the patient  has closed his wounds on the right second toe and left fourth toe although they look all normal. Last time he was here he had a superficial area over the left buttock this is also closed. The right lateral foot wound I had closed out last time although it is seems to have reopened today. 628/19 the areas over the right second and left fourth toe remains closed. The right lateral foot has tightly adherent necrotic surface over. We have been using silver collagen he offloads this and a bunny boot 12/12/17; the patient has reopened the recurrent area over the right second PIP joint. This apparently due to is due to repetitive trauma in the wheelchair although he uses a Social worker. He does not have obvious macrovascular disease. The area over the right lateral foot looks about the same as last time tightly adherent necrotic debris. We've been using Hydrofera Blue 12/25/17; 2 week follow-up. The area over the right second PIP is closed however the area on the right lateral foot is worse with surrounding erythema and pain compatible with cellulitis is going to need an antibiotic. AS WELL..... We have looked at his buttocks and lower back. He has 3 stage II ulcers. The exact history here is unclear however he is very disabled man with cerebral palsy. He goes to a day program from the group home in which she lives 5 days a week and I think he is on his buttock all day on these visits. Surrounding the wounds see has extensive stage I injury as well. FINALLY it looks as though he has lost  weight. I'm not the only staff member that is recognized this 01/08/18 on evaluation today patient appears to be doing better in regard to his gluteal wounds. In fact everything appears to be healed back here although very dry. He has a couple open sores on the scrotal region other than this it is really his foot that is still giving him the most trouble. He did complete the Keflex unfortunately it seems like there's still a lot of erythema in this in fact looks worse than it did previous. I'm gonna culture this today and likely start with a different antibiotic. 01/16/18; I was reassured that the wounds on his buttocks and sacrum are healed. I did not look at these the day. The culture that was done last week on 01/11/18 of the right lateral foot wounds showed Pseudomonas. Bactrim is not a good choice for this. Ciprofloxacin interacts with his muscle relaxants therefore I prescribed cefdinir 300 twice a day for 7 days. Bactrim coverage of pseudomonas is not reliable although the degree of erythema looks a lot better 01/30/18; the patient has completed his antibiotics. The area does not have infection on the right lateral foot. The wounds look a lot better. We've been using silver alginate 02/12/18; there is on the right lateral foot. Copious amounts of surface eschar. Also similarly over the PIP of the right second toe. We've been using silver alginate to date 03/02/2018; the area that remains is on the right lateral foot. The right second toe is healed. 03/16/2018; the area there is original wound on the right lateral foot. Still requiring debridement. We are using Hydrofera Blue. The right second toe is still healed/closed 03/30/2018; right lateral foot there is 2 small open areas remaining. They actually look quite healthy we have been using Hydrofera Blue the right second toe remains closed 04/13/2018; right lateral foot. 1 of the 2 small open areas from last time  has closed over. The other had  surface slough that I removed with a #3 curette. This looks quite healthy. The right second toe remains closed. We are using Hydrofera Blue 05/01/2018; right lateral foot very superficial areas that look like they are trying to close over. He had a new threatened area on the left medial ankle just below the medial malleolus. I think these represent chronic venous insufficiency areas 05/14/2018; the right right lateral foot looks like it closed over as was predicted last time however he has some dark areas subcutaneously erythema superiorly and this is really very tender. This is either coexistent cellulitis or perhaps a deep tissue injury. The area on the left medial ankle is superficial but has some size. We have been using Hydrofera Blue to both areas. The patient complains of pain in his right foot 06/09/2018; right lateral foot was closed over last time but there was coexistent cellulitis around this. I gave him Levaquin. He also has an area on the left medial malleolus. He has not been back to clinic in over 3-1/2 weeks. We are using Hydrofera Blue I think to the left medial malleolus and silver alginate to the right lateral foot 1/21; right lateral foot still non-viable debris over this area and the left medial malleolus. Both of these vigorously washed off with antiseptic gauze. We have been using silver alginate. 2/4; right lateral foot this looks somewhat better. Unfortunately the area over the left medial malleolus has deteriorated. Using silver alginate in both area 2/18; both wounds arrived today covered in a lot of necrotic debris. We have been using silver alginate. Clearly not making a lot of progress. 3/3; the patient's right second toe is reopened over the PIP. I am not sure why this is happening. He may have microvascular disease. We have checked his macrovascular flow in the past and found it to be normal. He may need an x-ray. The area on the right lateral foot requires  debridement. The area on the left lateral ankle is worse. We have been using PolyMem Ag 08/26/18 on evaluation today patient appears to be doing a little worse in regard to his lower extremities based on what I'm seeing what the nursing staff is telling me. His left medial ankle appears potential to be infected there is your thing on want surrounding the wound. His right second toe is also of concern at this point in my pinion. We have not done x-rays of these areas that may be a good idea to go ahead and proceed with at this point to ensure will not deal with anything such as osteomyelitis. 4/16; it is been a while since I saw this patient. He now has wounds on the right lateral foot, dorsal right second toe, substantially on the left medial malleolus and a small area on the left lateral foot. We have been using PolyMem Silver. Curlex and Coban. He has home health changing the dressing. Since last time he was here he had a culture of the left medial ankle that showed MRSA. He is completed a course/7 days of doxycycline. XRAYS of the left ankle and right foot that were ordered were negative for osteomyelitis 4/30; he arrives in clinic today with some maceration around the wound and a lot of tenderness and erythema in a circumferential area around the right lateral foot. He has wound areas on the right lateral foot dorsal right second toe, largely over the left medial malleolus and a small area over the left lateral foot  5/7; completed his antibiotics from last time right lateral foot cellulitis is better and the area on his left knee which we discovered after he had been seen by the provider also is improved. He still has extensive wound areas over the right lateral foot, left medial malleolus, right second toe and left lateral foot although all of these look somewhat better with PolyMem 5/15. Wounds on the right lateral foot, right second toe left medial malleolus and the left lateral foot. All of  these look somewhat better. We have been using PolyMem Ag 5/26; the wound on the right lateral foot is almost closed over but he still complains of a lot of pain in this area. He has a eschared area on the right second toe but no open wound.. Also concerning today his original wound on the left medial malleolus looks healthy however just inferior to this almost areas of deep tissue injury and there is an area on the left lateral foot also looks like a deep tissue injury. In the past I have wondered whether he might have cholesterol emboli or some other form of a vasculopathy. I wonder whether this could have something to do with this. 6/2; right lateral foot wound is still open I gave him empiric antibiotics last week without really a lot of change she is still complaining of pain in this area. We x-rayed his feet at some point in April these were negative. It would be difficult to do an MRI. The area on his left medial malleolus looks better. Right second toe is healed. He still has what looks to be a small pressure related area on the left lateral foot but it is not open. We are using PolyMem to all wound which seems to be doing nicely 6/16; right lateral foot wound is still open. The wound looks better although there is still a lot of tenderness around it. Rather than more empiric antibiotics I have cultured this today. We are using PolyMem Ag. On the left lateral malleolus the wound appears better. He has a new area on the left lower anterior pretibial area 6/29; 2-week follow-up. Culture I did of the right lateral foot 2 weeks ago showed MRSA. I gave him 2 weeks of Septra which he should be just about finishing now and this seems better. We got a call from home health last week to report swelling in the left leg because we were listed as his primary. We referred him back to primary care. He arrives today with intense erythema and tenderness around the 2 wounds on the left medial ankle on the  left lower calf. The area on the right lateral foot looks better 7/7; I brought him back this week to follow-up on his left foot which had cellulitis last week. I gave him clindamycin which as far as I know he tolerated. The erythema is better. He has a 3 current wounds one on the right lateral foot a small area on the right lateral malleolus and the more recent area on the left anterior pretibial area. 7/21; bilateral distal lower leg and foot wounds. The area on his right lateral foot looks a lot better. He has an area on his left pretibial area distally. The area on the medial malleolus on the left which is 1 of his worst wounds has closed over. He does not appear to have any open areas in his remaining toes 8/4-Patient returns at 2 weeks, he has the remaining wound on the left lower leg the  other 2 wounds have healed. Patient is continued to be treated with silver alginate with a 2 layer wrap on the left 8/18; I been following this patient for a long period for bilateral lower extremity wounds. 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 His wounds are probably pressure although he religiously wears thick bunny boots. He does not have macrovascular disease but I did formal testing on him perhaps 2 or 3 years ago. I have often wondered about either cholesterol emboli or a vasculopathy of some sort given the distal nature of these variable locations etc. He has 2 areas that we have been following one on the left medial lower leg and one on the right lateral foot. Both of these are mostly healed today there is some eschar over sections of both areas although I did not disturb this. Electronic Signature(s) Signed: 01/12/2019 5:30:55 PM By: Baltazar Najjar MD Entered By: Baltazar Najjar on 01/12/2019 09:49:40 -------------------------------------------------------------------------------- Physical Exam Details Patient Name: Date of  Service: [name], [name] 01/12/2019 9:00 AM Medical Record [id number] Patient Account Number: [id number] Date of Birth/Sex: Treating RN: Mar 18, 1961 (57 y.o. [name] Primary Care Provider: Orpah Cobb Other Clinician: Referring Provider: Treating Provider/Extender:Robson, Dorna Leitz, Orlie Pollen in Treatment: 68 Constitutional Sitting or standing Blood Pressure is within target range for patient.. Pulse regular and within target range for patient.Marland Kitchen Respirations regular, non-labored and within target range.. Temperature is normal and within the target range for the patient.Marland Kitchen Appears in no distress. Eyes Conjunctivae clear. No discharge.no icterus. Cardiovascular Popliteal pulses are palpable. Pedal pulses palpable and strong bilaterally.. Integumentary (Hair, Skin) No primary cutaneous issues are seen there is no erythema around the remaining areas. Psychiatric appears at normal baseline. Notes Wound exam; the patient has eschar over the right lateral foot. He had some sort of foam over this with gauze and Coban. We took this off and looked at this foot. The wound on the right lateral foot which is I think at the base of the fifth metatarsal looks just about healed there is some eschar here I did not disturb it. Similarly on the left the wound was on the left medial lower leg. Some eschar here as well but I did not disturb this either. Electronic Signature(s) Signed: 01/12/2019 5:30:55 PM By: Baltazar Najjar MD Entered By: Baltazar Najjar on 01/12/2019 09:51:45 -------------------------------------------------------------------------------- Physician Orders Details Patient Name: Date of Service: [name], [name] 01/12/2019 9:00 AM Medical Record [id number] Patient Account Number: [id number] Date of Birth/Sex: Treating RN: 08/15/1960 (57 y.o. 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 Dressing Change Frequency Wound #32 Left,Medial Lower Leg Change dressing three times week. Wound #33 Right,Lateral Foot Change dressing three times week. Wound Cleansing Wound #32 Left,Medial Lower Leg Clean wound with Wound Cleanser - or normal saline Wound #33 Right,Lateral Foot Clean wound with Wound Cleanser - or normal saline Primary Wound Dressing Wound #32 Left,Medial Lower Leg Calcium Alginate with Silver Wound #33 Right,Lateral Foot Calcium Alginate with Silver Secondary Dressing Wound #32 Left,Medial Lower Leg Foam Border Wound #33 Right,Lateral  Foot Foam Border Off-Loading Multipodus Splint to: - sage boots both feet at all times. please float feet to offload pressure to lateral side of feet. Gel mattress overlay (Group 1) - for extended mattress/bed Turn and reposition every 2 hours Other: - Need to limit time spent at day program to prevent pressure ulcers on buttocks and sacrum from worsening Home Health Wound #32 Left,Medial Lower Leg Continue Home Health skilled nursing for wound care. - [facility] Wound #33 Right,Lateral Foot Continue Home Health skilled nursing for wound care. - [facility] Electronic Signature(s) Signed: 01/12/2019 5:30:55 PM By: Baltazar Najjarobson, Michael MD Signed: 05/04/2019 3:07:45 PM By: Yevonne PaxEpps, Carrie RN Entered By: Yevonne PaxEpps, Carrie on 01/12/2019 13:07:45 -------------------------------------------------------------------------------- Problem List Details Patient Name: Date of Service: [name] RhineHumble, [name]  01/12/2019 9:00 AM Medical Record [id number] Patient Account Number: [id number] Date of Birth/Sex: Treating RN: 1960-07-18 (57 y.o. Judie PetitM) Jettie PaganEpps, Martinarrie Primary Care Provider: Orpah CobbMULLIS, KIERSTEN Other Clinician: Referring Provider: Treating Provider/Extender:Robson, Dorna LeitzMichael MULLIS, Orlie PollenKIERSTEN Weeks in Treatment: 3778 Active Problems ICD-10 Evaluated Encounter Code Description Active Date Today Diagnosis L97.512 Non-pressure chronic ulcer of other part of right foot 12/25/2017 No Yes with fat layer exposed L97.322 Non-pressure chronic ulcer of left ankle with fat layer 05/01/2018 No Yes exposed L97.521 Non-pressure chronic ulcer of other part of left foot 11/10/2018 No Yes limited to breakdown of skin L97.511 Non-pressure chronic ulcer of other part of right foot 07/14/2017 No Yes limited to breakdown of skin Inactive Problems ICD-10 Code Description Active Date Inactive Date L03.116 Cellulitis of left lower limb 08/26/2018 08/26/2018 L89.312 Pressure ulcer of right buttock, stage 2 12/25/2017 12/25/2017 L03.115 Cellulitis of right lower limb 12/25/2017 12/25/2017 I73.89 Other specified peripheral vascular diseases 08/26/2018 08/26/2018 L89.152 Pressure ulcer of sacral region, stage 2 12/25/2017 12/25/2017 L89.322 Pressure ulcer of left buttock, stage 2 12/25/2017 12/25/2017 L03.116 Cellulitis of left lower limb 11/23/2018 11/23/2018 Resolved Problems Electronic Signature(s) Signed: 01/12/2019 5:30:55 PM By: Baltazar Najjarobson, Michael MD Entered By: Baltazar Najjarobson, Michael on 01/12/2019 09:52:17 -------------------------------------------------------------------------------- Progress Note Details Patient Name: Date of Service: [name] RhineHumble, [name] 01/12/2019 9:00 AM Medical Record [id number] Patient Account Number: [id number] Date of Birth/Sex: Treating RN: 1960-07-18 (57 y.o. Melonie FloridaM) Epps, Carrie Primary Care Provider: Orpah CobbMULLIS, KIERSTEN Other Clinician: Referring Provider: Treating Provider/Extender:Robson, Dorna LeitzMichael MULLIS,  Orlie PollenKIERSTEN Weeks in Treatment: 4178 Subjective History of Present Illness (HPI) 11/23/15; this is a patient I remember from a previous stay in this clinic in 2012. The patient says this was for a wound in this same area as currently although I have no information to verify that. Most of the information is provided by his caregiver from the group home where he resides. Apparently he has had a wound in this area for 8 months. He also has erythema around this area and has had multiple rounds of antibiotics apparently with some improvement but the erythema is persists. He apparently had a fracture in the ankle 2 months ago and was seen by Dr. Lajoyce Cornersuda . He apparently had a splint applied and more recently a heel offloading boot. He had home health coming out to a week ago not clear what they are dressing this with but they apparently discharged him perhaps because of one of Dr. 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 He is not a diabetic. Looking through [facility] he appears to have fairly active primary care. The area on the ankle is variably labeled as a  pressure area and/or chronic venous insufficiency. He had a recent venous ultrasound on 5/25 that did not show a DVT in the left leg however this was not a reflux study. There was no superficial DVT either. Arterial studies on 10/15/14 showed a left ABI of 1.16 Center right of 1.12 waveforms were triphasic he does not have significant arterial disease. He has had recent x-rays of the left foot and ankle. The left foot did not show any abnormality. Left ankle x-ray showed a spiral fracture of the left tibial diaphysis without evidence of osteomyelitis. 11/30/15 culture I did last week showed pseudomonas I changed him to oral ciprofloxacin. Erythema is a lot better. 12/07/15 area around the wound shows considerable  improvement of the periwound erythema. 12/14/15; the patient has a improvement of the periwound erythema which in retrospect I think with cellulitis successfully treated. We have been using Iodoflex started last week 12/25/15 wound on the left medial malleolus and dorsal left foot. Theraskin #1 applied READMISSION 02/27/16; the patient was admitted to hospital from 8/14 through 01/18/16 initially felt to have cellulitis of his left lower leg however he was noted to have significant persistent pain and swelling. A duplex ultrasound was ordered that was negative. Obie Dredge an x-ray was done that showed a spiral fracture of the left leg. With posterior displacement and angulation at the mid left tibia. Orthopedics saw the patient and splinted the leg. He was then sent to [facility] skilled facility and only return to his group home last week. He has wounds on the left medial malleolus, left dorsal foot and a worrisome area on the plantar left heel which is not really open but may represent a hematoma or a DTI 03/06/16; areas of left anterior and medial ankle both look improved. There are 3 small wounds here. He has a area over the dorsal left first metatarsal head which is not open. In meantime his left heel has opened 03/14/16 the medial left ankle wounds looks stable to improved. The area on the posterior left heel has opened up and had a very adherent eschar and slept over this 03/21/16 patient has 3 wounds on his left medial ankle is stable area on his left dorsal first metatarsal head appears to be improved. Continued problems with adherent eschar over the left heel 03/28/16; patient has 3 wounds on his left medial ankle and lower leg all of these appear to be stable. The problem continues to be the left heel and the adherent eschar. This may have been a pressure ulcer from her brace at one point. He has a English as a second language teacher we have been using [employer] and all wound areas. Dressings are being changed  by home health 04/04/16; the patient's areas on the medial aspect of the left ankle looks superficial and appear to be gradually closing. The area on the plantar aspects/Achilles aspect of his left heel as surface slough and nonviable subcutaneous tissue requiring debridement. 04/11/16; the areas on the patient's medial left ankle appear to be superficial and appear to be closing. The area on the plantar heel on the left has surface slough and nonviable subcutaneous tissue requiring debridement. I don't think this is too much different from last week there is also undermining 04/25/16; patient is followed in his group home by [facility] home health. The area on the plantar heel is smaller but with still with some depth. We're using Iodoflex to this. The areas on the left ankle looks healthy but dry and somewhat larger. Cause of  this is not really clear using Prisma 05/02/16; areas x3 all look improved expecially the areas on the medial ankle. Heel wound is smaller but still deep 05/09/16; all areas o3 look improved on the medial ankle. Heel wound is also contracting. 12/0.9/17 the areas on his left medial ankle are all closed. I think these are largely chronic venous insufficiency however there is no edema. The area on his left heel was a pressure area. READMISSION 08/02/16; this man is a patient that we have had in this clinic on several times before. I believe he has cerebral palsy is nonambulatory. He has chronic venous insufficiency with venous inflammation. He was last year in the fall of 2017 with areas on his left medial ankle left heel and left dorsal foot. He arrives today with once again a vague history. As mentioned he is nonambulatory he spends most of his day in a pressure offloading boot nevertheless they noted wounds in this area on the medial left ankle 2 weeks ago. There is any other history here I am uncertain 08/15/16; the patient's wounds on his medial ankle actually look better  however the area on the left lateral/dorsal foot is covered in black necrotic eschar. Previous formal arterial studies did not show significant arterial disease with normal ABIs and triphasic waveforms. He is immobilized and at risk for pressure although he seems aware protective boots at all times as far as I am aware. 08/22/16; in general his wounds look better. All the wounds on his medial left ankle look improved the area on his lateral foot however continues to be problematic. Patient comes in the clinic today in a new wheelchair he is quite delighted 09/05/16; a wound on the left medial Calf and left dorsal foot are healed. The area on the left lateral foot is improved however the area on the left medial malleolus is inflamed with a considerable degree of surrounding erythema. This looks like cellulitis. ABIs and clinical exam are within normal limits 09/12/16; the patient continues to have a wound on the left medial calf. I was concerned about cellulitis in this area last week and gave him a course of doxycycline. The wound looks better here. The area on the left lateral foot however is unchanged 09/20/16; the patient came in this week early after traumatizing his toe on the left foot on a table while moving around in his motorized wheelchair. 09/26/16- he is here for follow-up evaluation of his left foot and malleolus ulcers. Since his appointment he abraded his left medial hallux against a table while in his motorized wheelchair, apparently trying to pull up to the table for a meal. He continues to complain of tenderness to the left lateral foot, surrounding the current ulcer 10/07/16; left foot and malleolus ulcers. 10/14/16; the patient is doing well. He has a wound on the left lateral, left medial malleolus and atraumatic wound on the left great toe. Although these appear to be superficial and closing 10/31/16; the patient is doing well. The area on the left lateral foot, left medial malleolus  and the left great toe or all epithelialized. He has home health 11/14/16; patient arrives today with the area on the left great toe healed. The left lateral foot however in the left medial malleolus and the ankle superficial wounds that are slightly larger. He has erythema and tenderness around the area on the left lateral foot. As well he is complaining of dysuria. His caregiver notes that when he gets like this he is more  lethargic which indeed seems to be the case today 11/21/16; left great toe is still healed. His wound on the left lateral foot appears healthy and slightly smaller also the area on the left medial ankle. This had black eschar on it which I removed. The remaining wound looks satisfactory 12/12/16; patient hasn't been here in a number of weeks. The area on the left lateral foot is healed however on his medial ankle has 2 open areas one of them covered with a necrotic eschar. He also has a new wound on the right second toe which was apparently trauma induced while he was at a movie 12/27/16; the patient has wounds on his left medial mallelus and left lateral foot that are just about closed. The area on the right second toe which was trauma from last time is still open but again everything looks a lot better here. 01/30/17; patient arrives in clinic today and is really been a long time since I've seen him. The wounds are all epithelialized on the left foot this includes his left lateral foot, left medial malleolus. Apparently was in the ER for what of I think was a fungal infection on his buttock's he was prescribed Diflucan and doxycycline I think this is already getting better. READMISSION Keano is a now 58 year old man who has advanced cerebral palsy and functional quadriplegia. We have had him in this clinic multiple times before for wounds on his right ankle, left ankle, left dorsal foot and most recently from 08/02/16 through 01/30/17 with his left medial ankle and left foot and  finally right second toe. His attendant from the group home where he lives is very knowledgeable about him. She states that they noticed breakdown in his skin in his feet sometime in January although they could not get an appointment until today in this clinic. The previous wounds have always been felt to be secondary to incidental trauma/pressure areas or possibly some degree of chronic venous insufficiency. He had normal arterial studies in may of 2016. He has definite open wounds on the right lateral foot over the base of the fifth metatarsal, the right second toe DIP, the left medial malleolus. He has concerning areas over the left lateral fifth metatarsal base, retaform purpura over the medial left ankle and perhaps the medial left first toe. 07/21/17; culture we did last week from the base of the right fifth metatarsal grew group G strep. This should've been sensitive to the doxycycline I gave him. The other major wounds are on the right second toe DIP, left medial malleolus, base of the left lateral fifth metatarsal and the medial left ankle and medial left toe. 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 We have macrovascular arterial studies scheduled for Thursday although I don't think this is what the problem is. 07/28/17; patient arrives with really no improvement. He has a wound on the right second toe, lateral right foot, right medial malleolus which was new this week, left medial malleolus, but looked like a DTI on the left great toe. His macrovascular studies are on 07/30/17 although I would be surprised if this provides all the explanation. He has small areas even on his right second and third toe which look like splinter-type hemorrhages. The question I have is where is this coming from. I'd like to see the vascular studies however before we pursue this. He has had a DVT in the past but is no longer on anticoagulants. I'll need to review his history 08/04/17; the patient  had  his ARTERIAL STUDIES this showed a right ABI of 1.16 and the left ABI of 1.20. TBI on the left at 0.84. He is at a right first toe amputation. The studies were unchanged from studies in May 2016. It was not felt that he had significant lower extremity arterial disease In the meantime we are using silver alginate to the wounds on the right second toe right lateral foot left medial malleolus. I'm going to change to Silver collagen today. 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 Working him up for this would include an echocardiogram probably a CT scan of the chest and abdomen to look at the major thoracic and abdominal aortic vessels. I am not going to involve myself in this unless there is more symptoms/signs 08/18/17; the patient arrives today with wounds looking somewhat better. The remaining areas are the right second toe dorsal, right lateral foot and left medial malleolus which only has a small open area. We've been using silver collagen 09/01/17;right second toe dorsally probes to bone today. This is a deterioration. We've been using silver collagen. Only a small open area remains on the left medial malleolus. 09/09/17; right second toe appears better. There is no probing bone. He has a small area remaining on the left medial malleolus, the right lateral foot. We have been using silver collagen 10/07/17; the patient returns to clinic today after a month hiatus. He has wounds over the right second toe, right lateral foot and the left medial malleolus. We have been using silver collagen. He has home health. 10/24/17; patient has open wounds over the right second toe, now the left fourth toe and a superficial area over his left buttock. Some of the other areas have healed including the left medial malleolus and right lateral foot. Using silver collagen on these  wounds 11/07/17; the patient has closed his wounds on the right second toe and left fourth toe although they look all normal. Last time he was here he had a superficial area over the left buttock this is also closed. The right lateral foot wound I had closed out last time although it is seems to have reopened today. 628/19 the areas over the right second and left fourth toe remains closed. The right lateral foot has tightly adherent necrotic surface over. We have been using silver collagen he offloads this and a bunny boot 12/12/17; the patient has reopened the recurrent area over the right second PIP joint. 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 He does not have obvious macrovascular disease. The area over the right lateral foot looks about the same as last time tightly adherent necrotic debris. We've been using Hydrofera Blue 12/25/17; 2 week follow-up. The area over the right second PIP is closed however the area on the right lateral foot is worse with surrounding erythema and pain compatible with cellulitis is going to need an antibiotic. ooAS WELL..... We have looked at his buttocks and lower back. He has 3 stage II ulcers. The exact history here is unclear however he is very disabled man with cerebral palsy. He goes to a day program from the group home in which she lives 5 days a week and I think he is on his buttock all day on these visits. Surrounding the wounds see has extensive stage I injury as well. ooFINALLY it looks as  though he has lost weight. I'm not the only staff member that is recognized this 01/08/18 on evaluation today patient appears to be doing better in regard to his gluteal wounds. In fact everything appears to be healed back here although very dry. He has a couple open sores on the scrotal region other than this it is really his foot that is still giving him the most trouble. He did complete the Keflex  unfortunately it seems like there's still a lot of erythema in this in fact looks worse than it did previous. I'm gonna culture this today and likely start with a different antibiotic. 01/16/18; I was reassured that the wounds on his buttocks and sacrum are healed. I did not look at these the day. The culture that was done last week on 01/11/18 of the right lateral foot wounds showed Pseudomonas. Bactrim is not a good choice for this. Ciprofloxacin interacts with his muscle relaxants therefore I prescribed cefdinir 300 twice a day for 7 days. Bactrim coverage of pseudomonas is not reliable although the degree of erythema looks a lot better 01/30/18; the patient has completed his antibiotics. The area does not have infection on the right lateral foot. The wounds look a lot better. We've been using silver alginate 02/12/18; there is on the right lateral foot. Copious amounts of surface eschar. Also similarly over the PIP of the right second toe. We've been using silver alginate to date 03/02/2018; the area that remains is on the right lateral foot. The right second toe is healed. 03/16/2018; the area there is original wound on the right lateral foot. Still requiring debridement. We are using Hydrofera Blue. The right second toe is still healed/closed 03/30/2018; right lateral foot there is 2 small open areas remaining. They actually look quite healthy we have been using Hydrofera Blue the right second toe remains closed 04/13/2018; right lateral foot. 1 of the 2 small open areas from last time has closed over. The other had surface slough that I removed with a #3 curette. This looks quite healthy. The right second toe remains closed. We are using Hydrofera Blue 05/01/2018; right lateral foot very superficial areas that look like they are trying to close over. He had a new threatened area on the left medial ankle just below the medial malleolus. I think these represent chronic venous insufficiency  areas 05/14/2018; the right right lateral foot looks like it closed over as was predicted last time however he has some dark areas subcutaneously erythema superiorly and this is really very tender. This is either coexistent cellulitis or perhaps a deep tissue injury. The area on the left medial ankle is superficial but has some size. We have been using Hydrofera Blue to both areas. The patient complains of pain in his right foot 06/09/2018; right lateral foot was closed over last time but there was coexistent cellulitis around this. I gave him Levaquin. He also has an area on the left medial malleolus. He has not been back to clinic in over 3-1/2 weeks. We are using Hydrofera Blue I think to the left medial malleolus and silver alginate to the right lateral foot 1/21; right lateral foot still non-viable debris over this area and the left medial malleolus. Both of these vigorously washed off with antiseptic gauze. We have been using silver alginate. 2/4; right lateral foot this looks somewhat better. Unfortunately the area over the left medial malleolus has deteriorated. Using silver alginate in both area 2/18; both wounds arrived today covered in a lot  of necrotic debris. We have been using silver alginate. Clearly not making a lot of progress. 3/3; the patient's right second toe is reopened over the PIP. I am not sure why this is happening. He may have microvascular disease. We have checked his macrovascular flow in the past and found it to be normal. He may need an x-ray. The area on the right lateral foot requires debridement. The area on the left lateral ankle is worse. We have been using PolyMem Ag 08/26/18 on evaluation today patient appears to be doing a little worse in regard to his lower extremities based on what I'm seeing what the nursing staff is telling me. His left medial ankle appears potential to be infected there is your thing on want surrounding the wound. His right second toe is  also of concern at this point in my pinion. We have not done x-rays of these areas that may be a good idea to go ahead and proceed with at this point to ensure will not deal with anything such as osteomyelitis. 4/16; it is been a while since I saw this patient. He now has wounds on the right lateral foot, dorsal right second toe, substantially on the left medial malleolus and a small area on the left lateral foot. We have been using PolyMem Silver. Curlex and Coban. He has home health changing the dressing. Since last time he was here he had a culture of the left medial ankle that showed MRSA. He is completed a course/7 days of doxycycline. XRAYS of the left ankle and right foot that were ordered were negative for osteomyelitis 4/30; he arrives in clinic today with some maceration around the wound and a lot of tenderness and erythema in a circumferential area around the right lateral foot. He has wound areas on the right lateral foot dorsal right second toe, largely over the left medial malleolus and a small area over the left lateral foot 5/7; completed his antibiotics from last time right lateral foot cellulitis is better and the area on his left knee which we discovered after he had been seen by the provider also is improved. He still has extensive wound areas over the right lateral foot, left medial malleolus, right second toe and left lateral foot although all of these look somewhat better with PolyMem 5/15. Wounds on the right lateral foot, right second toe left medial malleolus and the left lateral foot. All of these look somewhat better. We have been using PolyMem Ag 5/26; the wound on the right lateral foot is almost closed over but he still complains of a lot of pain in this area. He has a eschared area on the right second toe but no open wound.[name] concerning today his original wound on the left medial malleolus looks healthy however just inferior to this almost areas of deep  tissue injury and there is an area on the left lateral foot also looks like a deep tissue injury. In the past I have wondered whether he might have cholesterol emboli or some other form of a vasculopathy. I wonder whether this could have something to do with this. 6/2; right lateral foot wound is still open I gave him empiric antibiotics last week without really a lot of change she is still complaining of pain in this area. We x-rayed his feet at some point in April these were negative. It would be difficult to do an MRI. The area on his left medial malleolus looks better. Right second toe is healed.  He still has what looks to be a small pressure related area on the left lateral foot but it is not open. We are using PolyMem to all wound which seems to be doing nicely 6/16; right lateral foot wound is still open. The wound looks better although there is still a lot of tenderness around it. Rather than more empiric antibiotics I have cultured this today. We are using PolyMem Ag. On the left lateral malleolus the wound appears better. He has a new area on the left lower anterior pretibial area 6/29; 2-week follow-up. Culture I did of the right lateral foot 2 weeks ago showed MRSA. I gave him 2 weeks of Septra which he should be just about finishing now and this seems better. We got a call from home health last week to report swelling in the left leg because we were listed as his primary. We referred him back to primary care. He arrives today with intense erythema and tenderness around the 2 wounds on the left medial ankle on the left lower calf. The area on the right lateral foot looks better 7/7; I brought him back this week to follow-up on his left foot which had cellulitis last week. I gave him clindamycin which as far as I know he tolerated. The erythema is better. He has a 3 current wounds one on the right lateral foot a small area on the right lateral malleolus and the more recent area on the  left anterior pretibial area. 7/21; bilateral distal lower leg and foot wounds. The area on his right lateral foot looks a lot better. He has an area on his left pretibial area distally. The area on the medial malleolus on the left which is 1 of his worst wounds has closed over. He does not appear to have any open areas in his remaining toes 8/4-Patient returns at 2 weeks, he has the remaining wound on the left lower leg the other 2 wounds have healed. Patient is continued to be treated with silver alginate with a 2 layer wrap on the left 8/18; I been following this patient for a long period for bilateral lower extremity wounds. 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 His wounds are probably pressure although he religiously wears thick bunny boots. He does not have macrovascular disease but I did formal testing on him perhaps 2 or 3 years ago. I have often wondered about either cholesterol emboli or a vasculopathy of some sort given the distal nature of these variable locations etc. He has 2 areas that we have been following one on the left medial lower leg and one on the right lateral foot. Both of these are mostly healed today there is some eschar over sections of both areas although I did not disturb this. Objective Constitutional Sitting or standing Blood Pressure is within target range for patient.. Pulse regular and within target range for patient.Marland Kitchen Respirations regular, non-labored and within target range.. Temperature is normal and within the target range for the patient.Marland Kitchen Appears in no distress. Vitals Time Taken: 9:26 AM, Height: 73 in, Weight: 178 lbs, BMI: 23.5, Temperature: 98.2 F, Pulse: 74 bpm, Respiratory Rate: 20 breaths/min, Blood Pressure: 124/77 mmHg. Eyes Conjunctivae clear. No discharge.no icterus. Cardiovascular Popliteal pulses are palpable. Pedal pulses palpable and strong bilaterally.Marland Kitchen Psychiatric appears at  normal baseline. General Notes: Wound exam; the patient has eschar over the right lateral foot. He had some sort of foam over this with gauze and Coban.  We took this off and looked at this foot. The wound on the right lateral foot which is I think at the base of the fifth metatarsal looks just about healed there is some eschar here I did not disturb it. ooSimilarly on the left the wound was on the left medial lower leg. Some eschar here as well but I did not disturb this either. Integumentary (Hair, Skin) No primary cutaneous issues are seen there is no erythema around the remaining areas. Wound #32 status is Open. Original cause of wound was Gradually Appeared. The wound is located on the Left,Medial Lower Leg. The wound measures 0.5cm length x 0.7cm width x 0.1cm depth; 0.275cm^2 area and 0.027cm^3 volume. There is no tunneling or undermining noted. There is a none present amount of drainage noted. The wound margin is flat and intact. There is no granulation within the wound bed. There is a large (67-100%) amount of necrotic tissue within the wound bed including Eschar. Wound #33 status is Open. Original cause of wound was Gradually Appeared. The wound is located on the Right,Lateral Foot. The wound measures 1cm length x 0.7cm width x 0.1cm depth; 0.55cm^2 area and 0.055cm^3 volume. There is no tunneling or undermining noted. There is a none present amount of drainage noted. There is no granulation within the wound bed. There is a large (67-100%) amount of necrotic tissue within the wound bed including Eschar and Adherent Slough. 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 Dressing Change Frequency: Wound #32 Left,Medial Lower Leg: Change dressing three times week. Wound #33 Right,Lateral Foot: Change dressing three times week. Wound Cleansing: Wound #32 Left,Medial Lower Leg: Clean wound with Wound Cleanser - or normal saline Wound #33 Right,Lateral Foot: Clean wound with Wound Cleanser - or normal saline Primary Wound Dressing: Wound #32 Left,Medial Lower Leg: Calcium Alginate with Silver Wound #33 Right,Lateral Foot: Calcium Alginate with Silver Secondary Dressing: Wound #32 Left,Medial Lower Leg: Foam Border Wound #33 Right,Lateral Foot: Foam Border Off-Loading: Multipodus Splint to: - sage boots both feet at all times. please float feet to offload pressure to lateral side of feet. Gel mattress overlay (Group 1) - for extended mattress/bed Turn and reposition every 2 hours Other: - Need to limit time spent at day program to prevent pressure ulcers on buttocks and sacrum from worsening Home Health: Wound #32 Left,Medial Lower Leg: Continue Home Health skilled nursing for wound care. - [facility] Wound #33 Right,Lateral Foot: Continue Home Health skilled nursing for wound care. - [facility] 1. We put silver alginate and border foam on both of these areas 2. He will need his bunny boots indefinitely 3. The wounds are probably pressure but I have often wondered about alternative etiology in this man. 4. I will bring him back in a month to make sure the areas that I am questioning including the right lateral foot and the left medial lower leg are both still closed Electronic Signature(s) Signed: 01/12/2019 5:30:55 PM By: Baltazar Najjar MD Entered By: Baltazar Najjar on 01/12/2019 09:53:09 -------------------------------------------------------------------------------- SuperBill Details Patient Name: Date of Service: [name] 01/12/2019 Medical Record [id number] Patient Account Number:  [id number] Date of Birth/Sex: Treating RN: 1960-11-14 (58 y.o. M)  [name] Primary Care Provider: Orpah Cobb Other Clinician: Referring Provider: Treating Provider/Extender:Robson, Dorna Leitz, Orlie Pollen in Treatment: 77 Diagnosis Coding ICD-10 Codes Code Description 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 breakdown of skin L97.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 layer exposed L97.322 Non-pressure chronic ulcer of left 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 L97.521 Non-pressure chronic ulcer of other part of left foot limited to breakdown of skin Facility Procedures CPT4 Code: 16109604 Description: 99214 - WOUND CARE VISIT-LEV 4 EST PT Modifier: Quantity: 1 Physician Procedures CPT4 Code Description: 5409811 99213 - WC PHYS LEVEL 3 - EST PT ICD-10 Diagnosis Description L97.512 Non-pressure chronic ulcer of other part of right foot with L97.322 Non-pressure chronic ulcer of left ankle with fat layer exp Modifier: fat layer expos osed Quantity: 1 ed Electronic Signature(s) Signed: 01/12/2019 5:30:55 PM By: Baltazar Najjar MD Signed: 05/04/2019 3:07:45 PM By: [name] RN Entered By: [name] on 01/12/2019 10:18:50

## 2019-05-04 NOTE — Progress Notes (Signed)
Anxiety Photos Wound Measurements Length: (cm) 0.2 Width: (cm) 0.4 Depth: (cm) 0.1 Area: (cm) 0.063 Volume: (cm) 0.006 Wound Description Classification: Category/Stage II Wound Margin: Flat and Intact Exudate Amount: Small Exudate Type: Serosanguineous Exudate Color: red, brown Wound Bed Granulation Amount:  Large (67-100%) Granulation Quality: Pink Necrotic Amount: None Present (0%) After Cleansing: No rino No Exposed Structure sed: No Subcutaneous Tissue) Exposed: Yes sed: No sed: No ed: No d: No % Reduction in Area: -103.2% % Reduction in Volume: -100% Epithelialization: Medium (34-66%) Tunneling: No Undermining: No Foul Odor Slough/Fib Fascia Expo Fat Layer ( Tendon Expo Muscle Expo Joint Expos Bone Expose Treatment Notes Wound #34 (Sacrum) 1. Cleanse With Wound Cleanser 2. Periwound Care Skin Prep 3. Primary Dressing Applied Calcium Alginate Ag 4. Secondary Dressing Foam Border Dressing Electronic Signature(s) Signed: 05/03/2019 5:51:44 PM By: Levan Hurst RN, BSN Signed: 05/04/2019 4:34:42 PM By: Mikeal Hawthorne EMT/HBOT Entered By: Mikeal Hawthorne on 05/03/2019 13:32:06 -------------------------------------------------------------------------------- Wound Assessment Details Patient Name: Date of Service: [name], [name] 04/28/2019 9:45 AM Medical Record [id number] Patient Account Number: [id number] Date of Birth/Sex: Treating RN: November 21, 1960 (58 y.o. [name] Primary Care Isa Hitz: [name] Other Clinician: Referring Petina Muraski: Treating Sontee Desena/Extender:Stone III, Danny Lawless, Randon Goldsmith in Treatment: 44 Wound Status Wound Number: 35 Primary Pressure Ulcer Etiology: Wound Location: Left Malleolus - Medial Wound Open Wounding Event: Pressure Injury Status: Date Acquired: 04/28/2019 Comorbid Deep Vein Thrombosis, Colitis, Weeks Of Treatment: 0 History: Osteomyelitis, Neuropathy, Quadriplegia, Clustered Wound: No Confinement Anxiety Photos Wound Measurements Length: (cm) 0.8 Width: (cm) 0.8 Depth: (cm) 0.1 Area: (cm) 0.503 Volume: (cm) 0.05 Wound Description Classification: Category/Stage II Wound Margin: Flat and Intact Exudate Amount: Medium Exudate Type: Serosanguineous Exudate Color: red, brown Wound  Bed Granulation Amount: Large (67-100%) Granulation Quality: Red Necrotic Amount: None Present (0%) ter Cleansing: No no No Exposed Structure d: No bcutaneous Tissue) Exposed: No d: No d: No : No No % Reduction in Area: 0% % Reduction in Volume: 0% Epithelialization: None Tunneling: No Undermining: No Foul Odor Af Slough/Fibri Fascia Expose Fat Layer (Su Tendon Expose Muscle Expose Joint Exposed Bone Exposed: Treatment Notes Wound #35 (Left, Medial Malleolus) 1. Cleanse With Wound Cleanser 2. Periwound Care Skin Prep 3. Primary Dressing Applied Calcium Alginate Ag 4. Secondary Dressing Foam Border Dressing Electronic Signature(s) Signed: 05/03/2019 5:51:44 PM By: Levan Hurst RN, BSN Signed: 05/04/2019 4:34:42 PM By: Mikeal Hawthorne EMT/HBOT Entered By: Mikeal Hawthorne on 05/03/2019 13:31:40 -------------------------------------------------------------------------------- Vitals Details Patient Name: Date of Service: [name], [name] 04/28/2019 9:45 AM Medical Record [id number] Patient Account Number: [id number] Date of Birth/Sex: Treating RN: 01/21/1961 (58 y.o. [name] Primary Care Amauri Keefe: [name] Other Clinician: Referring Mickeal Daws: Treating Tariyah Pendry/Extender:Stone III, Danny Lawless, Randon Goldsmith in Treatment: 50 Vital Signs Time Taken: 10:12 Temperature (F): 97.8 Height (in): 73 Pulse (bpm): 83 Weight (lbs): 178 Respiratory Rate (breaths/min): 20 Body Mass Index (BMI): 23.5 Blood Pressure (mmHg): 137/84 Reference Range: 80 - 120 mg / dl Electronic Signature(s) Signed: 05/03/2019 5:51:44 PM By: Levan Hurst RN, BSN Entered By: Levan Hurst on 04/28/2019 10:22:48  02/27/2018 Goal Status: Met Patient/caregiver will verbalize understanding of pressure ulcer management Date Initiated: 07/14/2017 Target Resolution Date: 05/26/2019 Goal Status: Active Interventions: Assess: immobility, friction, shearing, incontinence upon admission and as needed Assess offloading mechanisms upon admission and as needed Assess potential for pressure ulcer upon admission and as needed Provide education on pressure ulcers Notes: Wound/Skin Impairment Nursing Diagnoses: Impaired tissue integrity Knowledge deficit related to ulceration/compromised skin integrity Goals: Patient/caregiver will verbalize understanding of skin care regimen Date Initiated: 07/14/2017 Target Resolution Date: 05/26/2019 Goal Status: Active Ulcer/skin breakdown will have a volume reduction of 30% by week 4 Date Initiated: 07/14/2017 Date Inactivated: 08/18/2017 Target Resolution Date: 08/15/2017 Goal Status: Met Ulcer/skin breakdown will have a volume reduction of 50% by week 8 Date Initiated: 07/14/2017 Date Inactivated: 10/09/2017 Target Resolution Date: 09/19/2017 Goal Status: Unmet Unmet Reason: infection Ulcer/skin breakdown will heal within 14 weeks Date Initiated: 07/14/2017 Date Inactivated: 10/24/2017 Target Resolution Date: 10/24/2017 Unmet Reason: complex Goal Status: Unmet  comorbidities Interventions: Assess patient/caregiver ability to obtain necessary supplies Assess patient/caregiver ability to perform ulcer/skin care regimen upon admission and as needed Assess ulceration(s) every visit Provide education on ulcer and skin care Notes: Electronic Signature(s) Signed: 04/28/2019 6:22:02 PM By: Baruch Gouty RN, BSN Entered By: Baruch Gouty on 04/28/2019 10:40:37 -------------------------------------------------------------------------------- Pain Assessment Details Patient Name: Date of Service: [name], [name] 04/28/2019 9:45 AM Medical Record [id number] Patient Account Number: [id number] Date of Birth/Sex: Treating RN: 10/08/1960 (58 y.o. [name] Primary Care Jiyan Walkowski: [name] Other Clinician: Referring Arturo Sofranko: Treating Resha Filippone/Extender:Stone III, Danny Lawless, Randon Goldsmith in Treatment: 68 Active Problems Location of Pain Severity and Description of Pain Patient Has Paino Yes Site Locations Pain Location: Pain in Ulcers With Dressing Change: Yes Duration of the Pain. Constant / Intermittento Intermittent Rate the pain. Current Pain Level: 6 Character of Pain Describe the Pain: Throbbing Pain Management and Medication Current Pain Management: Medication: No Cold Application: No Rest: No Massage: No Activity: No T.E.N.S.: No Heat Application: No Leg drop or elevation: No Is the Current Pain Management Adequate: Adequate How does your wound impact your activities of daily livingo Sleep: No Bathing: No Appetite: No Relationship With Others: No Bladder Continence: No Emotions: No Bowel Continence: No Work: No Toileting: No Drive: No Dressing: No Hobbies: No Engineer, maintenance) Signed: 05/03/2019 5:51:44 PM By: Levan Hurst RN, BSN Entered By: Levan Hurst on 04/28/2019 10:23:07 -------------------------------------------------------------------------------- Patient/Caregiver Education  Details Patient Name: [name] 12/2/2020andnbsp9:45 Date of Service: AM Medical Record [id number] Number: Patient Account Number: [id number] Treating RN: [birth year] (58 y.o. Baruch Gouty Date of Birth/Gender: M) Other Clinician: Primary Care Physician: Sheria Lang Referring Physician: Physician/Extender: [name] in Treatment: 67 Education Assessment Education Provided To: Patient Education Topics Provided Pressure: Methods: Explain/Verbal Responses: Reinforcements needed, State content correctly Wound/Skin Impairment: Methods: Explain/Verbal Responses: Reinforcements needed, State content correctly Electronic Signature(s) Signed: 04/28/2019 6:22:02 PM By: Baruch Gouty RN, BSN Entered By: Baruch Gouty on 04/28/2019 10:41:04 -------------------------------------------------------------------------------- Wound Assessment Details Patient Name: Date of Service: JEZREEL, JUSTINIANO 04/28/2019 9:45 AM Medical Record [id number] Patient Account Number: [id number] Date of Birth/Sex: Treating RN: 1960-10-30 (58 y.o. [name] Primary Care Kypton Eltringham: [name] Other Clinician: Referring Merik Mignano: Treating Ayasha Ellingsen/Extender:Stone III, Danny Lawless, Randon Goldsmith in Treatment: 46 Wound Status Wound Number: 34 Primary Pressure Ulcer Etiology: Wound Location: Sacrum Wound Open Wounding Event: Pressure Injury Status: Date Acquired: 02/25/2019 Comorbid Deep Vein Thrombosis, Colitis, Weeks Of Treatment: 4 History: Osteomyelitis, Neuropathy, Quadriplegia, Clustered Wound: No Confinement  02/27/2018 Goal Status: Met Patient/caregiver will verbalize understanding of pressure ulcer management Date Initiated: 07/14/2017 Target Resolution Date: 05/26/2019 Goal Status: Active Interventions: Assess: immobility, friction, shearing, incontinence upon admission and as needed Assess offloading mechanisms upon admission and as needed Assess potential for pressure ulcer upon admission and as needed Provide education on pressure ulcers Notes: Wound/Skin Impairment Nursing Diagnoses: Impaired tissue integrity Knowledge deficit related to ulceration/compromised skin integrity Goals: Patient/caregiver will verbalize understanding of skin care regimen Date Initiated: 07/14/2017 Target Resolution Date: 05/26/2019 Goal Status: Active Ulcer/skin breakdown will have a volume reduction of 30% by week 4 Date Initiated: 07/14/2017 Date Inactivated: 08/18/2017 Target Resolution Date: 08/15/2017 Goal Status: Met Ulcer/skin breakdown will have a volume reduction of 50% by week 8 Date Initiated: 07/14/2017 Date Inactivated: 10/09/2017 Target Resolution Date: 09/19/2017 Goal Status: Unmet Unmet Reason: infection Ulcer/skin breakdown will heal within 14 weeks Date Initiated: 07/14/2017 Date Inactivated: 10/24/2017 Target Resolution Date: 10/24/2017 Unmet Reason: complex Goal Status: Unmet  comorbidities Interventions: Assess patient/caregiver ability to obtain necessary supplies Assess patient/caregiver ability to perform ulcer/skin care regimen upon admission and as needed Assess ulceration(s) every visit Provide education on ulcer and skin care Notes: Electronic Signature(s) Signed: 04/28/2019 6:22:02 PM By: Baruch Gouty RN, BSN Entered By: Baruch Gouty on 04/28/2019 10:40:37 -------------------------------------------------------------------------------- Pain Assessment Details Patient Name: Date of Service: [name], [name] 04/28/2019 9:45 AM Medical Record [id number] Patient Account Number: [id number] Date of Birth/Sex: Treating RN: 10/08/1960 (58 y.o. [name] Primary Care Jiyan Walkowski: [name] Other Clinician: Referring Arturo Sofranko: Treating Resha Filippone/Extender:Stone III, Danny Lawless, Randon Goldsmith in Treatment: 68 Active Problems Location of Pain Severity and Description of Pain Patient Has Paino Yes Site Locations Pain Location: Pain in Ulcers With Dressing Change: Yes Duration of the Pain. Constant / Intermittento Intermittent Rate the pain. Current Pain Level: 6 Character of Pain Describe the Pain: Throbbing Pain Management and Medication Current Pain Management: Medication: No Cold Application: No Rest: No Massage: No Activity: No T.E.N.S.: No Heat Application: No Leg drop or elevation: No Is the Current Pain Management Adequate: Adequate How does your wound impact your activities of daily livingo Sleep: No Bathing: No Appetite: No Relationship With Others: No Bladder Continence: No Emotions: No Bowel Continence: No Work: No Toileting: No Drive: No Dressing: No Hobbies: No Engineer, maintenance) Signed: 05/03/2019 5:51:44 PM By: Levan Hurst RN, BSN Entered By: Levan Hurst on 04/28/2019 10:23:07 -------------------------------------------------------------------------------- Patient/Caregiver Education  Details Patient Name: [name] 12/2/2020andnbsp9:45 Date of Service: AM Medical Record [id number] Number: Patient Account Number: [id number] Treating RN: [birth year] (58 y.o. Baruch Gouty Date of Birth/Gender: M) Other Clinician: Primary Care Physician: Sheria Lang Referring Physician: Physician/Extender: [name] in Treatment: 67 Education Assessment Education Provided To: Patient Education Topics Provided Pressure: Methods: Explain/Verbal Responses: Reinforcements needed, State content correctly Wound/Skin Impairment: Methods: Explain/Verbal Responses: Reinforcements needed, State content correctly Electronic Signature(s) Signed: 04/28/2019 6:22:02 PM By: Baruch Gouty RN, BSN Entered By: Baruch Gouty on 04/28/2019 10:41:04 -------------------------------------------------------------------------------- Wound Assessment Details Patient Name: Date of Service: JEZREEL, JUSTINIANO 04/28/2019 9:45 AM Medical Record [id number] Patient Account Number: [id number] Date of Birth/Sex: Treating RN: 1960-10-30 (58 y.o. [name] Primary Care Kypton Eltringham: [name] Other Clinician: Referring Merik Mignano: Treating Ayasha Ellingsen/Extender:Stone III, Danny Lawless, Randon Goldsmith in Treatment: 46 Wound Status Wound Number: 34 Primary Pressure Ulcer Etiology: Wound Location: Sacrum Wound Open Wounding Event: Pressure Injury Status: Date Acquired: 02/25/2019 Comorbid Deep Vein Thrombosis, Colitis, Weeks Of Treatment: 4 History: Osteomyelitis, Neuropathy, Quadriplegia, Clustered Wound: No Confinement  02/27/2018 Goal Status: Met Patient/caregiver will verbalize understanding of pressure ulcer management Date Initiated: 07/14/2017 Target Resolution Date: 05/26/2019 Goal Status: Active Interventions: Assess: immobility, friction, shearing, incontinence upon admission and as needed Assess offloading mechanisms upon admission and as needed Assess potential for pressure ulcer upon admission and as needed Provide education on pressure ulcers Notes: Wound/Skin Impairment Nursing Diagnoses: Impaired tissue integrity Knowledge deficit related to ulceration/compromised skin integrity Goals: Patient/caregiver will verbalize understanding of skin care regimen Date Initiated: 07/14/2017 Target Resolution Date: 05/26/2019 Goal Status: Active Ulcer/skin breakdown will have a volume reduction of 30% by week 4 Date Initiated: 07/14/2017 Date Inactivated: 08/18/2017 Target Resolution Date: 08/15/2017 Goal Status: Met Ulcer/skin breakdown will have a volume reduction of 50% by week 8 Date Initiated: 07/14/2017 Date Inactivated: 10/09/2017 Target Resolution Date: 09/19/2017 Goal Status: Unmet Unmet Reason: infection Ulcer/skin breakdown will heal within 14 weeks Date Initiated: 07/14/2017 Date Inactivated: 10/24/2017 Target Resolution Date: 10/24/2017 Unmet Reason: complex Goal Status: Unmet  comorbidities Interventions: Assess patient/caregiver ability to obtain necessary supplies Assess patient/caregiver ability to perform ulcer/skin care regimen upon admission and as needed Assess ulceration(s) every visit Provide education on ulcer and skin care Notes: Electronic Signature(s) Signed: 04/28/2019 6:22:02 PM By: Baruch Gouty RN, BSN Entered By: Baruch Gouty on 04/28/2019 10:40:37 -------------------------------------------------------------------------------- Pain Assessment Details Patient Name: Date of Service: [name], [name] 04/28/2019 9:45 AM Medical Record [id number] Patient Account Number: [id number] Date of Birth/Sex: Treating RN: 10/08/1960 (58 y.o. [name] Primary Care Jiyan Walkowski: [name] Other Clinician: Referring Arturo Sofranko: Treating Resha Filippone/Extender:Stone III, Danny Lawless, Randon Goldsmith in Treatment: 68 Active Problems Location of Pain Severity and Description of Pain Patient Has Paino Yes Site Locations Pain Location: Pain in Ulcers With Dressing Change: Yes Duration of the Pain. Constant / Intermittento Intermittent Rate the pain. Current Pain Level: 6 Character of Pain Describe the Pain: Throbbing Pain Management and Medication Current Pain Management: Medication: No Cold Application: No Rest: No Massage: No Activity: No T.E.N.S.: No Heat Application: No Leg drop or elevation: No Is the Current Pain Management Adequate: Adequate How does your wound impact your activities of daily livingo Sleep: No Bathing: No Appetite: No Relationship With Others: No Bladder Continence: No Emotions: No Bowel Continence: No Work: No Toileting: No Drive: No Dressing: No Hobbies: No Engineer, maintenance) Signed: 05/03/2019 5:51:44 PM By: Levan Hurst RN, BSN Entered By: Levan Hurst on 04/28/2019 10:23:07 -------------------------------------------------------------------------------- Patient/Caregiver Education  Details Patient Name: [name] 12/2/2020andnbsp9:45 Date of Service: AM Medical Record [id number] Number: Patient Account Number: [id number] Treating RN: [birth year] (58 y.o. Baruch Gouty Date of Birth/Gender: M) Other Clinician: Primary Care Physician: Sheria Lang Referring Physician: Physician/Extender: [name] in Treatment: 67 Education Assessment Education Provided To: Patient Education Topics Provided Pressure: Methods: Explain/Verbal Responses: Reinforcements needed, State content correctly Wound/Skin Impairment: Methods: Explain/Verbal Responses: Reinforcements needed, State content correctly Electronic Signature(s) Signed: 04/28/2019 6:22:02 PM By: Baruch Gouty RN, BSN Entered By: Baruch Gouty on 04/28/2019 10:41:04 -------------------------------------------------------------------------------- Wound Assessment Details Patient Name: Date of Service: JEZREEL, JUSTINIANO 04/28/2019 9:45 AM Medical Record [id number] Patient Account Number: [id number] Date of Birth/Sex: Treating RN: 1960-10-30 (58 y.o. [name] Primary Care Kypton Eltringham: [name] Other Clinician: Referring Merik Mignano: Treating Ayasha Ellingsen/Extender:Stone III, Danny Lawless, Randon Goldsmith in Treatment: 46 Wound Status Wound Number: 34 Primary Pressure Ulcer Etiology: Wound Location: Sacrum Wound Open Wounding Event: Pressure Injury Status: Date Acquired: 02/25/2019 Comorbid Deep Vein Thrombosis, Colitis, Weeks Of Treatment: 4 History: Osteomyelitis, Neuropathy, Quadriplegia, Clustered Wound: No Confinement

## 2019-06-02 ENCOUNTER — Encounter (HOSPITAL_BASED_OUTPATIENT_CLINIC_OR_DEPARTMENT_OTHER): Payer: Medicare Other | Admitting: Physician Assistant

## 2019-06-03 ENCOUNTER — Other Ambulatory Visit: Payer: Self-pay | Admitting: Family Medicine

## 2019-06-15 ENCOUNTER — Other Ambulatory Visit: Payer: Self-pay | Admitting: Family Medicine

## 2019-06-23 ENCOUNTER — Other Ambulatory Visit (HOSPITAL_COMMUNITY)
Admission: RE | Admit: 2019-06-23 | Discharge: 2019-06-23 | Disposition: A | Discharge status: Home / Self Care | Payer: Medicare Other | Source: Other Acute Inpatient Hospital | Attending: Physician Assistant | Admitting: Physician Assistant

## 2019-06-23 ENCOUNTER — Encounter (HOSPITAL_BASED_OUTPATIENT_CLINIC_OR_DEPARTMENT_OTHER): Payer: Medicare Other | Attending: Physician Assistant | Admitting: Physician Assistant

## 2019-06-23 ENCOUNTER — Other Ambulatory Visit: Payer: Self-pay

## 2019-06-23 DIAGNOSIS — Z86718 Personal history of other venous thrombosis and embolism: Secondary | ICD-10-CM | POA: Diagnosis not present

## 2019-06-23 DIAGNOSIS — G809 Cerebral palsy, unspecified: Secondary | ICD-10-CM | POA: Diagnosis not present

## 2019-06-23 DIAGNOSIS — L89899 Pressure ulcer of other site, unspecified stage: Secondary | ICD-10-CM | POA: Diagnosis not present

## 2019-06-23 DIAGNOSIS — L89522 Pressure ulcer of left ankle, stage 2: Secondary | ICD-10-CM | POA: Diagnosis present

## 2019-06-23 DIAGNOSIS — L89892 Pressure ulcer of other site, stage 2: Secondary | ICD-10-CM | POA: Diagnosis not present

## 2019-06-23 DIAGNOSIS — Z8616 Personal history of COVID-19: Secondary | ICD-10-CM | POA: Diagnosis not present

## 2019-06-23 DIAGNOSIS — L89893 Pressure ulcer of other site, stage 3: Secondary | ICD-10-CM | POA: Insufficient documentation

## 2019-06-23 DIAGNOSIS — I872 Venous insufficiency (chronic) (peripheral): Secondary | ICD-10-CM | POA: Insufficient documentation

## 2019-06-23 DIAGNOSIS — L89109 Pressure ulcer of unspecified part of back, unspecified stage: Secondary | ICD-10-CM | POA: Diagnosis not present

## 2019-06-23 DIAGNOSIS — R532 Functional quadriplegia: Secondary | ICD-10-CM | POA: Insufficient documentation

## 2019-06-23 DIAGNOSIS — L97322 Non-pressure chronic ulcer of left 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: Secondary | ICD-10-CM | POA: Diagnosis not present

## 2019-06-23 DIAGNOSIS — B999 Unspecified infectious disease: Secondary | ICD-10-CM | POA: Insufficient documentation

## 2019-06-23 NOTE — Progress Notes (Addendum)
[name], [name] ([id number]) Visit Report for 06/23/2019 Chief Complaint Document Details Patient Name: Date of Service: [name], [name] 06/23/2019 10:45 AM Medical Record [id number] Patient Account Number: [id number] Date of Birth/Sex: Treating RN: Oct 28, 1960 (59 y.o. M) Primary Care Provider: Orpah Cobb Other Clinician: Referring Provider: Treating Provider/Extender:[name], [name] in Treatment: 101 Information Obtained from: Patient Chief Complaint Patient is here for multiple wounds on his bilateral feet and left ankle. Electronic Signature(s) Signed: 06/23/2019 12:14:00 PM By: Lenda Kelp PA-C Previous Signature: 06/23/2019 11:03:35 AM Version By: Lenda Kelp PA-C Entered By: Lenda Kelp on 06/23/2019 12:13:59 -------------------------------------------------------------------------------- HPI Details Patient Name: Date of Service: [name], [name] 06/23/2019 10:45 AM Medical Record [id number] Patient Account Number: [id number] Date of Birth/Sex: Treating RN: Nov 22, 1960 (58 y.o. M) Primary Care Provider: Orpah Cobb Other Clinician: Referring Provider: Treating Provider/Extender:[name], [name] in Treatment: 101 History of Present Illness HPI Description: 11/23/15; this is a patient I remember from a previous stay in this clinic in 2012. The patient says this was for a wound in this same area as currently although I have no information to verify that. Most of the information is provided by his caregiver from the group home where he resides. Apparently he has had a wound in this area for 8 months. He also has erythema around this area and has had multiple rounds of antibiotics apparently with some improvement but the erythema is persists. He apparently had a fracture in the ankle 2 months ago and was seen by Dr. Lajoyce Corners . He apparently had a splint applied and more recently a heel offloading boot. He  had home health coming out to a week ago not clear what they are dressing this with but they apparently discharged him perhaps because of one of Dr. 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 He has a Nurse, adult for transferring at home he is nonambulatory and does not wear footwear. He is not a diabetic. Looking through [place] [facility] he appears to have fairly active primary care. The area on the ankle is variably labeled as a pressure area and/or chronic venous insufficiency. He had a recent venous ultrasound on 5/25 that did not show a DVT in the left leg however this was not a reflux study. There was no superficial DVT either. Arterial studies on 10/15/14 showed a left ABI of 1.16 Center right of 1.12 waveforms were triphasic he does not have significant arterial disease. He has had recent x-rays of the left foot and ankle. The left foot did not show any abnormality. Left ankle x-ray showed a spiral fracture of the left tibial diaphysis without evidence of osteomyelitis. 11/30/15 culture I did last week showed pseudomonas I changed him to oral ciprofloxacin. Erythema is a lot better. 12/07/15 area around the wound shows considerable improvement of the periwound erythema. 12/14/15; the patient has a improvement of the periwound erythema which in retrospect I think with cellulitis successfully treated. We have been using Iodoflex started last week 12/25/15 wound on the left medial malleolus and dorsal left foot. Theraskin #1 applied READMISSION 02/27/16; the patient was admitted to hospital from 8/14 through 01/18/16 initially felt to have cellulitis of his left lower leg however he was noted to have significant persistent pain and swelling. A duplex ultrasound was ordered that was negative. Obie Dredge an x-ray was done that showed a spiral fracture of the left leg. With posterior displacement and angulation at the mid left  tibia. Orthopedics saw the patient and  splinted the leg. He was then sent to [facility] skilled facility and only return to his group home last week. He has wounds on the left medial malleolus, left dorsal foot and a worrisome area on the plantar left heel which is not really open but may represent a hematoma or a DTI 03/06/16; areas of left anterior and medial ankle both look improved. There are 3 small wounds here. He has a area over the dorsal left first metatarsal head which is not open. In meantime his left heel has opened 03/14/16 the medial left ankle wounds looks stable to improved. The area on the posterior left heel has opened up and had a very adherent eschar and slept over this 03/21/16 patient has 3 wounds on his left medial ankle is stable area on his left dorsal first metatarsal head appears to be improved. Continued problems with adherent eschar over the left heel 03/28/16; patient has 3 wounds on his left medial ankle and lower leg all of these appear to be stable. The problem continues to be the left heel and the adherent eschar. This may have been a pressure ulcer from her brace at one point. He has a English as a second language teacher we have been using [employer] and all wound areas. Dressings are being changed by home health 04/04/16; the patient's areas on the medial aspect of the left ankle looks superficial and appear to be gradually closing. The area on the plantar aspects/Achilles aspect of his left heel as surface slough and nonviable subcutaneous tissue requiring debridement. 04/11/16; the areas on the patient's medial left ankle appear to be superficial and appear to be closing. The area on the plantar heel on the left has surface slough and nonviable subcutaneous tissue requiring debridement. I don't think this is too much different from last week there is also undermining 04/25/16; patient is followed in his group home by [facility] home health. The area on the plantar heel is smaller but with still with some depth. We're  using Iodoflex to this. The areas on the left ankle looks healthy but dry and somewhat larger. Cause of this is not really clear using Prisma 05/02/16; areas x3 all look improved expecially the areas on the medial ankle. Heel wound is smaller but still deep 05/09/16; all areas 3 look improved on the medial ankle. Heel wound is also contracting. 12/0.9/17 the areas on his left medial ankle are all closed. I think these are largely chronic venous insufficiency however there is no edema. The area on his left heel was a pressure area. READMISSION 08/02/16; this man is a patient that we have had in this clinic on several times before. I believe he has cerebral palsy is nonambulatory. He has chronic venous insufficiency with venous inflammation. He was last year in the fall of 2017 with areas on his left medial ankle left heel and left dorsal foot. He arrives today with once again a vague history. As mentioned he is nonambulatory he spends most of his day in a pressure offloading boot nevertheless they noted wounds in this area on the medial left ankle 2 weeks ago. There is any other history here I am uncertain 08/15/16; the patient's wounds on his medial ankle actually look better however the area on the left lateral/dorsal foot is covered in black necrotic eschar. Previous formal arterial studies did not show significant arterial disease with normal ABIs and triphasic waveforms. He is immobilized and at risk for pressure although he  seems aware protective boots at all times as far as I am aware. 08/22/16; in general his wounds look better. All the wounds on his medial left ankle look improved the area on his lateral foot however continues to be problematic. Patient comes in the clinic today in a new wheelchair he is quite delighted 09/05/16; a wound on the left medial Calf and left dorsal foot are healed. The area on the left lateral foot is improved however the area on the left medial malleolus is  inflamed with a considerable degree of surrounding erythema. This looks like cellulitis. ABIs and clinical exam are within normal limits 09/12/16; the patient continues to have a wound on the left medial calf. I was concerned about cellulitis in this area last week and gave him a course of doxycycline. The wound looks better here. The area on the left lateral foot however is unchanged 09/20/16; the patient came in this week early after traumatizing his toe on the left foot on a table while moving around in his motorized wheelchair. 09/26/16- he is here for follow-up evaluation of his left foot and malleolus ulcers. Since his appointment he abraded his left medial hallux against a table while in his motorized wheelchair, apparently trying to pull up to the table for a meal. He continues to complain of tenderness to the left lateral foot, surrounding the current ulcer 10/07/16; left foot and malleolus ulcers. 10/14/16; the patient is doing well. He has a wound on the left lateral, left medial malleolus and atraumatic wound on the left great toe. Although these appear to be superficial and closing 10/31/16; the patient is doing well. The area on the left lateral foot, left medial malleolus and the left great toe or all epithelialized. He has home health 11/14/16; patient arrives today with the area on the left great toe healed. The left lateral foot however in the left medial malleolus and the ankle superficial wounds that are slightly larger. He has erythema and tenderness around the area on the left lateral foot. As well he is complaining of dysuria. His caregiver notes that when he gets like this he is more lethargic which indeed seems to be the case today 11/21/16; left great toe is still healed. His wound on the left lateral foot appears healthy and slightly smaller also the area on the left medial ankle. This had black eschar on it which I removed. The remaining wound looks satisfactory 12/12/16;  patient hasn't been here in a number of weeks. The area on the left lateral foot is healed however on his medial ankle has 2 open areas one of them covered with a necrotic eschar. He also has a new wound on the right second toe which was apparently trauma induced while he was at a movie 12/27/16; the patient has wounds on his left medial mallelus and left lateral foot that are just about closed. The area on the right second toe which was trauma from last time is still open but again everything looks a lot better here. 01/30/17; patient arrives in clinic today and is really been a long time since I've seen him. The wounds are all epithelialized on the left foot this includes his left lateral foot, left medial malleolus. Apparently was in the ER for what of I think was a fungal infection on his buttock's he was prescribed Diflucan and doxycycline I think this is already getting better. READMISSION [name] is a now 59 year old man who has advanced cerebral palsy and functional quadriplegia. We  have had him in this clinic multiple times before for wounds on his right ankle, left ankle, left dorsal foot and most recently from 08/02/16 through 01/30/17 with his left medial ankle and left foot and finally right second toe. His attendant from the group home where he lives is very knowledgeable about him. She states that they noticed breakdown in his skin in his feet sometime in January although they could not get an appointment until today in this clinic. The previous wounds have always been felt to be secondary to incidental trauma/pressure areas or possibly some degree of chronic venous insufficiency. He had normal arterial studies in may of 2016. He has definite open wounds on the right lateral foot over the base of the fifth metatarsal, the right second toe DIP, the left medial malleolus. He has concerning areas over the left lateral fifth metatarsal base, retaform purpura over the medial left ankle and perhaps  the medial left first toe. 07/21/17; culture we did last week from the base of the right fifth metatarsal grew group G strep. This should've been sensitive to the doxycycline I gave him. The other major wounds are on the right second toe DIP, left medial malleolus, base of the left lateral fifth metatarsal and the medial left ankle and medial left toe. 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 We have macrovascular arterial studies scheduled for Thursday although I don't think this is what the problem is. 07/28/17; patient arrives with really no improvement. He has a wound on the right second toe, lateral right foot, right medial malleolus which was new this week, left medial malleolus, but looked like a DTI on the left great toe. His macrovascular studies are on 07/30/17 although I would be surprised if this provides all the explanation. He has small areas even on his right second and third toe which look like splinter-type hemorrhages. The question I have is where is this coming from. I'd like to see the vascular studies however before we pursue this. He has had a DVT in the past but is no longer on anticoagulants. I'll need to review his history 08/04/17; the patient had his ARTERIAL STUDIES this showed a right ABI of 1.16 and the left ABI of 1.20. TBI on the left at 0.84. He is at a right first toe amputation. The studies were unchanged from studies in May 2016. It was not felt that he had significant lower extremity arterial disease In the meantime we are using silver alginate to the wounds on the right second toe right lateral foot left medial malleolus. I'm going to change to Silver collagen today. 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 Working him up for this would include an echocardiogram probably a CT scan of the chest and abdomen to look at the  major thoracic and abdominal aortic vessels. I am not going to involve myself in this unless there is more symptoms/signs 08/18/17; the patient arrives today with wounds looking somewhat better. The remaining areas are the right second toe dorsal, right lateral foot and left medial malleolus which only has a small open area. We've been using silver collagen 09/01/17;right second toe dorsally probes to bone today. This is a deterioration. We've been using silver collagen. Only a small open area remains on the left medial malleolus. 09/09/17; right second toe appears better. There is no probing bone. He has a small area remaining on  the left medial malleolus, the right lateral foot. We have been using silver collagen 10/07/17; the patient returns to clinic today after a month hiatus. He has wounds over the right second toe, right lateral foot and the left medial malleolus. We have been using silver collagen. He has home health. 10/24/17; patient has open wounds over the right second toe, now the left fourth toe and a superficial area over his left buttock. Some of the other areas have healed including the left medial malleolus and right lateral foot. Using silver collagen on these wounds 11/07/17; the patient has closed his wounds on the right second toe and left fourth toe although they look all normal. Last time he was here he had a superficial area over the left buttock this is also closed. The right lateral foot wound I had closed out last time although it is seems to have reopened today. 628/19 the areas over the right second and left fourth toe remains closed. The right lateral foot has tightly adherent necrotic surface over. We have been using silver collagen he offloads this and a bunny boot 12/12/17; the patient has reopened the recurrent area over the right second PIP joint. This apparently due to is due to repetitive trauma in the wheelchair although he uses a English as a second language teacherbunny boot. He does not have  obvious macrovascular disease. The area over the right lateral foot looks about the same as last time tightly adherent necrotic debris. We've been using Hydrofera Blue 12/25/17; 2 week follow-up. The area over the right second PIP is closed however the area on the right lateral foot is worse with surrounding erythema and pain compatible with cellulitis is going to need an antibiotic. AS WELL..... We have looked at his buttocks and lower back. He has 3 stage II ulcers. The exact history here is unclear however he is very disabled man with cerebral palsy. He goes to a day program from the group home in which she lives 5 days a week and I think he is on his buttock all day on these visits. Surrounding the wounds see has extensive stage I injury as well. FINALLY it looks as though he has lost weight. I'm not the only staff member that is recognized this 01/08/18 on evaluation today patient appears to be doing better in regard to his gluteal wounds. In fact everything appears to be healed back here although very dry. He has a couple open sores on the scrotal region other than this it is really his foot that is still giving him the most trouble. He did complete the Keflex unfortunately it seems like there's still a lot of erythema in this in fact looks worse than it did previous. I'm gonna culture this today and likely start with a different antibiotic. 01/16/18; I was reassured that the wounds on his buttocks and sacrum are healed. I did not look at these the day. The culture that was done last week on 01/11/18 of the right lateral foot wounds showed Pseudomonas. Bactrim is not a good choice for this. Ciprofloxacin interacts with his muscle relaxants therefore I prescribed cefdinir 300 twice a day for 7 days. Bactrim coverage of pseudomonas is not reliable although the degree of erythema looks a lot better 01/30/18; the patient has completed his antibiotics. The area does not have infection on the right  lateral foot. The wounds look a lot better. We've been using silver alginate 02/12/18; there is on the right lateral foot. Copious amounts of surface eschar. Also similarly over  the PIP of the right second toe. We've been using silver alginate to date 03/02/2018; the area that remains is on the right lateral foot. The right second toe is healed. 03/16/2018; the area there is original wound on the right lateral foot. Still requiring debridement. We are using Hydrofera Blue. The right second toe is still healed/closed 03/30/2018; right lateral foot there is 2 small open areas remaining. They actually look quite healthy we have been using Hydrofera Blue the right second toe remains closed 04/13/2018; right lateral foot. 1 of the 2 small open areas from last time has closed over. The other had surface slough that I removed with a #3 curette. This looks quite healthy. The right second toe remains closed. We are using Hydrofera Blue 05/01/2018; right lateral foot very superficial areas that look like they are trying to close over. He had a new threatened area on the left medial ankle just below the medial malleolus. I think these represent chronic venous insufficiency areas 05/14/2018; the right right lateral foot looks like it closed over as was predicted last time however he has some dark areas subcutaneously erythema superiorly and this is really very tender. This is either coexistent cellulitis or perhaps a deep tissue injury. The area on the left medial ankle is superficial but has some size. We have been using Hydrofera Blue to both areas. The patient complains of pain in his right foot 06/09/2018; right lateral foot was closed over last time but there was coexistent cellulitis around this. I gave him Levaquin. He also has an area on the left medial malleolus. He has not been back to clinic in over 3-1/2 weeks. We are using Hydrofera Blue I think to the left medial malleolus and silver alginate to  the right lateral foot 1/21; right lateral foot still non-viable debris over this area and the left medial malleolus. Both of these vigorously washed off with antiseptic gauze. We have been using silver alginate. 2/4; right lateral foot this looks somewhat better. Unfortunately the area over the left medial malleolus has deteriorated. Using silver alginate in both area 2/18; both wounds arrived today covered in a lot of necrotic debris. We have been using silver alginate. Clearly not making a lot of progress. 3/3; the patient's right second toe is reopened over the PIP. I am not sure why this is happening. He may have microvascular disease. We have checked his macrovascular flow in the past and found it to be normal. He may need an x-ray. The area on the right lateral foot requires debridement. The area on the left lateral ankle is worse. We have been using PolyMem Ag 08/26/18 on evaluation today patient appears to be doing a little worse in regard to his lower extremities based on what I'm seeing what the nursing staff is telling me. His left medial ankle appears potential to be infected there is your thing on want surrounding the wound. His right second toe is also of concern at this point in my pinion. We have not done x-rays of these areas that may be a good idea to go ahead and proceed with at this point to ensure will not deal with anything such as osteomyelitis. 4/16; it is been a while since I saw this patient. He now has wounds on the right lateral foot, dorsal right second toe, substantially on the left medial malleolus and a small area on the left lateral foot. We have been using PolyMem Silver. Curlex and Coban. He has home health changing  the dressing. Since last time he was here he had a culture of the left medial ankle that showed MRSA. He is completed a course/7 days of doxycycline. XRAYS of the left ankle and right foot that were ordered were negative for osteomyelitis 4/30; he  arrives in clinic today with some maceration around the wound and a lot of tenderness and erythema in a circumferential area around the right lateral foot. He has wound areas on the right lateral foot dorsal right second toe, largely over the left medial malleolus and a small area over the left lateral foot 5/7; completed his antibiotics from last time right lateral foot cellulitis is better and the area on his left knee which we discovered after he had been seen by the provider also is improved. He still has extensive wound areas over the right lateral foot, left medial malleolus, right second toe and left lateral foot although all of these look somewhat better with PolyMem 5/15. Wounds on the right lateral foot, right second toe left medial malleolus and the left lateral foot. All of these look somewhat better. We have been using PolyMem Ag 5/26; the wound on the right lateral foot is almost closed over but he still complains of a lot of pain in this area. He has a eschared area on the right second toe but no open wound.. Also concerning today his original wound on the left medial malleolus looks healthy however just inferior to this almost areas of deep tissue injury and there is an area on the left lateral foot also looks like a deep tissue injury. In the past I have wondered whether he might have cholesterol emboli or some other form of a vasculopathy. I wonder whether this could have something to do with this. 6/2; right lateral foot wound is still open I gave him empiric antibiotics last week without really a lot of change she is still complaining of pain in this area. We x-rayed his feet at some point in April these were negative. It would be difficult to do an MRI. The area on his left medial malleolus looks better. Right second toe is healed. He still has what looks to be a small pressure related area on the left lateral foot but it is not open. We are using PolyMem to all wound which  seems to be doing nicely 6/16; right lateral foot wound is still open. The wound looks better although there is still a lot of tenderness around it. Rather than more empiric antibiotics I have cultured this today. We are using PolyMem Ag. On the left lateral malleolus the wound appears better. He has a new area on the left lower anterior pretibial area 6/29; 2-week follow-up. Culture I did of the right lateral foot 2 weeks ago showed MRSA. I gave him 2 weeks of Septra which he should be just about finishing now and this seems better. We got a call from home health last week to report swelling in the left leg because we were listed as his primary. We referred him back to primary care. He arrives today with intense erythema and tenderness around the 2 wounds on the left medial ankle on the left lower calf. The area on the right lateral foot looks better 7/7; I brought him back this week to follow-up on his left foot which had cellulitis last week. I gave him clindamycin which as far as I know he tolerated. The erythema is better. He has a 3 current wounds one on the  right lateral foot a small area on the right lateral malleolus and the more recent area on the left anterior pretibial area. 7/21; bilateral distal lower leg and foot wounds. The area on his right lateral foot looks a lot better. He has an area on his left pretibial area distally. The area on the medial malleolus on the left which is 1 of his worst wounds has closed over. He does not appear to have any open areas in his remaining toes 8/4-Patient returns at 2 weeks, he has the remaining wound on the left lower leg the other 2 wounds have healed. Patient is continued to be treated with silver alginate with a 2 layer wrap on the left 8/18; I been following this patient for a long period for bilateral lower extremity wounds. 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 His wounds are probably  pressure although he religiously wears thick bunny boots. He does not have macrovascular disease but I did formal testing on him perhaps 2 or 3 years ago. I have often wondered about either cholesterol emboli or a vasculopathy of some sort given the distal nature of these variable locations etc. He has 2 areas that we have been following one on the left medial lower leg and one on the right lateral foot. Both of these are mostly healed today there is some eschar over sections of both areas although I did not disturb this. 03/31/2019 on evaluation today patient appears to be doing a little bit more poorly in regard to the sacral region. He has 2 small wounds noted at this point. Fortunately there is no signs of active infection at this time. No fevers, chills, nausea, vomiting, or diarrhea. He is having some pain when he sits on his gluteal region a big part of the reason he has these wounds is likely due to the fact that his wheelchair has not been functioning properly. He does still have a couple areas of deep tissue injury on his lower extremities he does need new Prevalon offloading boots as well. 04/28/2019 on evaluation today patient appears to be doing well with regard to his wounds in particular. His ankle area has fairly small based on what I am seeing currently and his sacral region actually is still open but does not appear to be doing too poorly in my opinion. Fortunately there is no signs of active infection at this time. He was in the hospital unfortunately and this was from 04/08/2019 through 04/12/2019 secondary to persistent diarrhea, dehydration and acute kidney injury. With that being said fortunately the patient does not seem to be doing too poorly at this time which is good news. He is having pain mainly in the sacral region. 06/23/2019 on evaluation today patient appears to be doing well with regard to his sacral region which in fact is completely healed. Unfortunately he has an  area of rubbing/pressure on his right lateral chest/back region. This is where it is rubbing up on his chair. Subsequently he also has an open wound on the left medial malleolus which is not very open but starting to crack and then on the right lateral foot this is much more open at this point. Unfortunately there is no signs of significant improvement in regard to these areas and in fact there does appear to be evidence of active infection quite significantly. No fevers, chills, nausea, vomiting, or diarrhea. The patient did test positive for COVID-19 that has been greater than 14  days ago although they did not know the exact timing. That is why he was not here for his last appointment. Electronic Signature(s) Signed: 06/23/2019 12:25:45 PM By: Lenda Kelp PA-C Entered By: Lenda Kelp on 06/23/2019 12:25:44 -------------------------------------------------------------------------------- Physical Exam Details Patient Name: Date of Service: [name], [name] 06/23/2019 10:45 AM Medical Record [id number] Patient Account Number: [id number] Date of Birth/Sex: Treating RN: 1961/03/12 (59 y.o. M) Primary Care Provider: Orpah Cobb Other Clinician: Referring Provider: Treating Provider/Extender:[name], Reginal Lutes, Orlie Pollen in Treatment: 101 Constitutional Well-nourished and well-hydrated in no acute distress. Respiratory normal breathing without difficulty. Psychiatric Patient is not able to cooperate in decision making regarding care. Patient is oriented to person and place. pleasant and cooperative. Notes His wounds currently in regard to his feet I think are showing signs of significant cellulitis especially on the left although there is really nothing good to culture at that site. For that reason I am actually culture the right foot which is also showing signs of erythema and warmth surrounding. We will subsequently see what this shows and then make a  determination at that point based on what we are seeing if we need to make any changes in his regimen. In the meantime I am to see about starting him on doxycycline. Electronic Signature(s) Signed: 06/23/2019 12:26:18 PM By: Lenda Kelp PA-C Entered By: Lenda Kelp on 06/23/2019 12:26:17 -------------------------------------------------------------------------------- Physician Orders Details Patient Name: Date of Service: [name], [name] 06/23/2019 10:45 AM Medical Record [id number] Patient Account Number: [id number] Date of Birth/Sex: Treating RN: 1960/12/10 (58 y.o. [name] Primary Care Provider: Orpah Cobb Other Clinician: Referring Provider: Treating Provider/Extender:[name], Reginal Lutes, Orlie Pollen in Treatment: [id number] Verbal / Phone Orders: No Diagnosis Coding ICD-10 Coding Code Description [phone] Pressure ulcer of left ankle, stage 2 L89.892 Pressure ulcer of other site, stage 2 L89.893 Pressure ulcer of other site, stage 3 I73.89 Other specified peripheral vascular diseases Follow-up Appointments Return Appointment in 2 weeks. Dressing Change Frequency Wound #35 Left,Medial Malleolus Change Dressing every other day. - or as needed Wound #36 Right,Lateral Chest Change Dressing every other day. - or as needed Wound #37 Right,Lateral Foot Change Dressing every other day. - or as needed Skin Barriers/Peri-Wound Care Barrier cream - to sacrum as needed Moisturizing lotion - both legs daily Wound Cleansing Wound #35 Left,Medial Malleolus May shower and wash wound with soap and water. - with dressing changes Wound #36 Right,Lateral Chest May shower and wash wound with soap and water. - with dressing changes Wound #37 Right,Lateral Foot May shower and wash wound with soap and water. - with dressing changes Primary Wound Dressing Wound #35 Left,Medial Malleolus Calcium Alginate with Silver Wound #36 Right,Lateral Chest Xeroform Wound  #37 Right,Lateral Foot Calcium Alginate with Silver Secondary Dressing Wound #35 Left,Medial Malleolus Kerlix/Rolled Gauze Dry Gauze Wound #36 Right,Lateral Chest Foam Border Wound #37 Right,Lateral Foot Kerlix/Rolled Gauze Dry Gauze Edema Control Elevate legs to the level of the heart or above for 30 minutes daily and/or when sitting, a frequency of: Support Garment 10-20 mm/Hg pressure to: - support hose both legs daily Off-Loading Multipodus Splint to: - sage boots both feet at all times. please float feet to offload pressure to lateral side of feet. Gel mattress overlay (Group 1) - for extended mattress/bed Turn and reposition every 2 hours Home Health Continue Home Health skilled nursing for wound care. - [facility] Bacteria identified in Unspecified specimen by Anaerobe culture (MICRO) - right foot LOINC Code: 635-3  Convenience Name: Anerobic culture Patient Medications Allergies: adhesive tape Notifications Medication Indication Start End doxycycline hyclate 06/23/2019 DOSE 1 - oral 100 mg capsule - 1 capsule oral taken 2 times a day for 14 days. Do not take calcium or antacid with this medication Electronic Signature(s) Signed: 06/23/2019 6:10:09 PM By: Lenda Kelp PA-C Signed: 06/23/2019 6:17:53 PM By: Zenaida Deed RN, BSN Previous Signature: 06/23/2019 1:15:58 PM Version By: Lenda Kelp PA-C Entered By: Zenaida Deed on 06/23/2019 17:19:18 -------------------------------------------------------------------------------- Problem List Details Patient Name: Date of Service: [name], [name] 06/23/2019 10:45 AM Medical Record [id number] Patient Account Number: [id number] Date of Birth/Sex: Treating RN: 24-Nov-1960 (59 y.o. M) Primary Care Provider: Orpah Cobb Other Clinician: Referring Provider: Treating Provider/Extender:[name], [name] in Treatment: 101 Active Problems ICD-10 Evaluated Encounter Code  Description Active Date Today Diagnosis L89.522 Pressure ulcer of left ankle, stage 2 04/28/2019 No Yes L89.892 Pressure ulcer of other site, stage 2 06/23/2019 No Yes L89.893 Pressure ulcer of other site, stage 3 06/23/2019 No Yes I73.89 Other specified peripheral vascular diseases 08/26/2018 No Yes Inactive Problems ICD-10 Code Description Active Date Inactive Date 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 07/14/2017 07/14/2017 breakdown of skin L03.116 Cellulitis of left lower limb 08/26/2018 08/26/2018 L89.312 Pressure ulcer of right buttock, stage 2 12/25/2017 12/25/2017 L03.115 Cellulitis of right lower limb 12/25/2017 12/25/2017 L89.152 Pressure ulcer of sacral region, stage 2 12/25/2017 12/25/2017 L89.322 Pressure ulcer of left buttock, stage 2 12/25/2017 12/25/2017 L03.116 Cellulitis of left lower limb 11/23/2018 11/23/2018 Resolved Problems ICD-10 Code Description Active Date Resolved Date L97.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 12/25/2017 12/25/2017 layer exposed L97.322 Non-pressure chronic ulcer of left 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 05/01/2018 05/01/2018 L97.521 Non-pressure chronic ulcer of other part of left foot limited to 11/10/2018 11/10/2018 breakdown of skin L89.152 Pressure ulcer of sacral region, stage 2 03/31/2019 03/31/2019 Electronic Signature(s) Signed: 06/23/2019 12:13:39 PM By: Lenda Kelp PA-C Previous Signature: 06/23/2019 11:03:26 AM Version By: Lenda Kelp PA-C Entered By: Lenda Kelp on 06/23/2019 12:13:39 -------------------------------------------------------------------------------- Progress Note Details Patient Name: Date of Service: [name], [name] 06/23/2019 10:45 AM Medical Record [id number] Patient Account Number: [id number] Date of Birth/Sex: Treating RN: 12-Mar-1961 (59 y.o. M) Primary Care Provider: Orpah Cobb Other Clinician: Referring Provider: Treating Provider/Extender:[name],  [name] in Treatment: 101 Subjective Chief Complaint Information obtained from Patient Patient is here for multiple wounds on his bilateral feet and left ankle. History of Present Illness (HPI) 11/23/15; this is a patient I remember from a previous stay in this clinic in 2012. The patient says this was for a wound in this same area as currently although I have no information to verify that. Most of the information is provided by his caregiver from the group home where he resides. Apparently he has had a wound in this area for 8 months. He also has erythema around this area and has had multiple rounds of antibiotics apparently with some improvement but the erythema is persists. He apparently had a fracture in the ankle 2 months ago and was seen by Dr. Lajoyce Corners . He apparently had a splint applied and more recently a heel offloading boot. He had home health coming out to a week ago not clear what they are dressing this with but they apparently discharged him perhaps because of one of Dr. 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 He  has a Nurse, adult for transferring at home he is nonambulatory and does not wear footwear. He is not a diabetic. Looking through [facility] he appears to have fairly active primary care. The area on the ankle is variably labeled as a pressure area and/or chronic venous insufficiency. He had a recent venous ultrasound on 5/25 that did not show a DVT in the left leg however this was not a reflux study. There was no superficial DVT either. Arterial studies on 10/15/14 showed a left ABI of 1.16 Center right of 1.12 waveforms were triphasic he does not have significant arterial disease. He has had recent x-rays of the left foot and ankle. The left foot did not show any abnormality. Left ankle x-ray showed a spiral fracture of the left tibial diaphysis without evidence of osteomyelitis. 11/30/15 culture I did last week  showed pseudomonas I changed him to oral ciprofloxacin. Erythema is a lot better. 12/07/15 area around the wound shows considerable improvement of the periwound erythema. 12/14/15; the patient has a improvement of the periwound erythema which in retrospect I think with cellulitis successfully treated. We have been using Iodoflex started last week 12/25/15 wound on the left medial malleolus and dorsal left foot. Theraskin #1 applied READMISSION 02/27/16; the patient was admitted to hospital from 8/14 through 01/18/16 initially felt to have cellulitis of his left lower leg however he was noted to have significant persistent pain and swelling. A duplex ultrasound was ordered that was negative. Obie Dredge an x-ray was done that showed a spiral fracture of the left leg. With posterior displacement and angulation at the mid left tibia. Orthopedics saw the patient and splinted the leg. He was then sent to [facility] skilled facility and only return to his group home last week. He has wounds on the left medial malleolus, left dorsal foot and a worrisome area on the plantar left heel which is not really open but may represent a hematoma or a DTI 03/06/16; areas of left anterior and medial ankle both look improved. There are 3 small wounds here. He has a area over the dorsal left first metatarsal head which is not open. In meantime his left heel has opened 03/14/16 the medial left ankle wounds looks stable to improved. The area on the posterior left heel has opened up and had a very adherent eschar and slept over this 03/21/16 patient has 3 wounds on his left medial ankle is stable area on his left dorsal first metatarsal head appears to be improved. Continued problems with adherent eschar over the left heel 03/28/16; patient has 3 wounds on his left medial ankle and lower leg all of these appear to be stable. The problem continues to be the left heel and the adherent eschar. This may have been a pressure ulcer  from her brace at one point. He has a English as a second language teacher we have been using [employer] and all wound areas. Dressings are being changed by home health 04/04/16; the patient's areas on the medial aspect of the left ankle looks superficial and appear to be gradually closing. The area on the plantar aspects/Achilles aspect of his left heel as surface slough and nonviable subcutaneous tissue requiring debridement. 04/11/16; the areas on the patient's medial left ankle appear to be superficial and appear to be closing. The area on the plantar heel on the left has surface slough and nonviable subcutaneous tissue requiring debridement. I don't think this is too much different from last week there is also undermining 04/25/16; patient is  followed in his group home by [facility] home health. The area on the plantar heel is smaller but with still with some depth. We're using Iodoflex to this. The areas on the left ankle looks healthy but dry and somewhat larger. Cause of this is not really clear using Prisma 05/02/16; areas x3 all look improved expecially the areas on the medial ankle. Heel wound is smaller but still deep 05/09/16; all areas o3 look improved on the medial ankle. Heel wound is also contracting. 12/0.9/17 the areas on his left medial ankle are all closed. I think these are largely chronic venous insufficiency however there is no edema. The area on his left heel was a pressure area. READMISSION 08/02/16; this man is a patient that we have had in this clinic on several times before. I believe he has cerebral palsy is nonambulatory. He has chronic venous insufficiency with venous inflammation. He was last year in the fall of 2017 with areas on his left medial ankle left heel and left dorsal foot. He arrives today with once again a vague history. As mentioned he is nonambulatory he spends most of his day in a pressure offloading boot nevertheless they noted wounds in this area on the medial left ankle 2  weeks ago. There is any other history here I am uncertain 08/15/16; the patient's wounds on his medial ankle actually look better however the area on the left lateral/dorsal foot is covered in black necrotic eschar. Previous formal arterial studies did not show significant arterial disease with normal ABIs and triphasic waveforms. He is immobilized and at risk for pressure although he seems aware protective boots at all times as far as I am aware. 08/22/16; in general his wounds look better. All the wounds on his medial left ankle look improved the area on his lateral foot however continues to be problematic. Patient comes in the clinic today in a new wheelchair he is quite delighted 09/05/16; a wound on the left medial Calf and left dorsal foot are healed. The area on the left lateral foot is improved however the area on the left medial malleolus is inflamed with a considerable degree of surrounding erythema. This looks like cellulitis. ABIs and clinical exam are within normal limits 09/12/16; the patient continues to have a wound on the left medial calf. I was concerned about cellulitis in this area last week and gave him a course of doxycycline. The wound looks better here. The area on the left lateral foot however is unchanged 09/20/16; the patient came in this week early after traumatizing his toe on the left foot on a table while moving around in his motorized wheelchair. 09/26/16- he is here for follow-up evaluation of his left foot and malleolus ulcers. Since his appointment he abraded his left medial hallux against a table while in his motorized wheelchair, apparently trying to pull up to the table for a meal. He continues to complain of tenderness to the left lateral foot, surrounding the current ulcer 10/07/16; left foot and malleolus ulcers. 10/14/16; the patient is doing well. He has a wound on the left lateral, left medial malleolus and atraumatic wound on the left great toe. Although these  appear to be superficial and closing 10/31/16; the patient is doing well. The area on the left lateral foot, left medial malleolus and the left great toe or all epithelialized. He has home health 11/14/16; patient arrives today with the area on the left great toe healed. The left lateral foot however in the left  medial malleolus and the ankle superficial wounds that are slightly larger. He has erythema and tenderness around the area on the left lateral foot. As well he is complaining of dysuria. His caregiver notes that when he gets like this he is more lethargic which indeed seems to be the case today 11/21/16; left great toe is still healed. His wound on the left lateral foot appears healthy and slightly smaller also the area on the left medial ankle. This had black eschar on it which I removed. The remaining wound looks satisfactory 12/12/16; patient hasn't been here in a number of weeks. The area on the left lateral foot is healed however on his medial ankle has 2 open areas one of them covered with a necrotic eschar. He also has a new wound on the right second toe which was apparently trauma induced while he was at a movie 12/27/16; the patient has wounds on his left medial mallelus and left lateral foot that are just about closed. The area on the right second toe which was trauma from last time is still open but again everything looks a lot better here. 01/30/17; patient arrives in clinic today and is really been a long time since I've seen him. The wounds are all epithelialized on the left foot this includes his left lateral foot, left medial malleolus. Apparently was in the ER for what of I think was a fungal infection on his buttock's he was prescribed Diflucan and doxycycline I think this is already getting better. READMISSION Ansley is a now 59 year old man who has advanced cerebral palsy and functional quadriplegia. We have had him in this clinic multiple times before for wounds on his right  ankle, left ankle, left dorsal foot and most recently from 08/02/16 through 01/30/17 with his left medial ankle and left foot and finally right second toe. His attendant from the group home where he lives is very knowledgeable about him. She states that they noticed breakdown in his skin in his feet sometime in January although they could not get an appointment until today in this clinic. The previous wounds have always been felt to be secondary to incidental trauma/pressure areas or possibly some degree of chronic venous insufficiency. He had normal arterial studies in may of 2016. He has definite open wounds on the right lateral foot over the base of the fifth metatarsal, the right second toe DIP, the left medial malleolus. He has concerning areas over the left lateral fifth metatarsal base, retaform purpura over the medial left ankle and perhaps the medial left first toe. 07/21/17; culture we did last week from the base of the right fifth metatarsal grew group G strep. This should've been sensitive to the doxycycline I gave him. The other major wounds are on the right second toe DIP, left medial malleolus, base of the left lateral fifth metatarsal and the medial left ankle and medial left toe. 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 We have macrovascular arterial studies scheduled for Thursday although I don't think this is what the problem is. 07/28/17; patient arrives with really no improvement. He has a wound on the right second toe, lateral right foot, right medial malleolus which was new this week, left medial malleolus, but looked like a DTI on the left great toe. His macrovascular studies are on 07/30/17 although I would be surprised if this provides all the explanation. He has small areas even on his right second and third toe which look like splinter-type hemorrhages. The question I have  is where is this coming from. I'd like to see the vascular studies however before we pursue  this. He has had a DVT in the past but is no longer on anticoagulants. I'll need to review his history 08/04/17; the patient had his ARTERIAL STUDIES this showed a right ABI of 1.16 and the left ABI of 1.20. TBI on the left at 0.84. He is at a right first toe amputation. The studies were unchanged from studies in May 2016. It was not felt that he had significant lower extremity arterial disease In the meantime we are using silver alginate to the wounds on the right second toe right lateral foot left medial malleolus. I'm going to change to Silver collagen today. 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 Working him up for this would include an echocardiogram probably a CT scan of the chest and abdomen to look at the major thoracic and abdominal aortic vessels. I am not going to involve myself in this unless there is more symptoms/signs 08/18/17; the patient arrives today with wounds looking somewhat better. The remaining areas are the right second toe dorsal, right lateral foot and left medial malleolus which only has a small open area. We've been using silver collagen 09/01/17;right second toe dorsally probes to bone today. This is a deterioration. We've been using silver collagen. Only a small open area remains on the left medial malleolus. 09/09/17; right second toe appears better. There is no probing bone. He has a small area remaining on the left medial malleolus, the right lateral foot. We have been using silver collagen 10/07/17; the patient returns to clinic today after a month hiatus. He has wounds over the right second toe, right lateral foot and the left medial malleolus. We have been using silver collagen. He has home health. 10/24/17; patient has open wounds over the right second toe, now the left fourth toe and a superficial area over his left buttock. Some of  the other areas have healed including the left medial malleolus and right lateral foot. Using silver collagen on these wounds 11/07/17; the patient has closed his wounds on the right second toe and left fourth toe although they look all normal. Last time he was here he had a superficial area over the left buttock this is also closed. The right lateral foot wound I had closed out last time although it is seems to have reopened today. 628/19 the areas over the right second and left fourth toe remains closed. The right lateral foot has tightly adherent necrotic surface over. We have been using silver collagen he offloads this and a bunny boot 12/12/17; the patient has reopened the recurrent area over the right second PIP joint. This apparently due to is due to repetitive trauma in the wheelchair although he uses a English as a second language teacherbunny boot. He does not have obvious macrovascular disease. The area over the right lateral foot looks about the same as last time tightly adherent necrotic debris. We've been using Hydrofera Blue 12/25/17; 2 week follow-up. The area over the right second PIP is closed however the area on the right lateral foot is worse with surrounding erythema and pain compatible with cellulitis is going to need an antibiotic. ooAS WELL..... We have looked at his buttocks and lower back. He has 3 stage II ulcers. The exact history here is unclear however he is very disabled man with cerebral palsy. He goes to  a day program from the group home in which she lives 5 days a week and I think he is on his buttock all day on these visits. Surrounding the wounds see has extensive stage I injury as well. ooFINALLY it looks as though he has lost weight. I'm not the only staff member that is recognized this 01/08/18 on evaluation today patient appears to be doing better in regard to his gluteal wounds. In fact everything appears to be healed back here although very dry. He has a couple open sores on the scrotal region  other than this it is really his foot that is still giving him the most trouble. He did complete the Keflex unfortunately it seems like there's still a lot of erythema in this in fact looks worse than it did previous. I'm gonna culture this today and likely start with a different antibiotic. 01/16/18; I was reassured that the wounds on his buttocks and sacrum are healed. I did not look at these the day. The culture that was done last week on 01/11/18 of the right lateral foot wounds showed Pseudomonas. Bactrim is not a good choice for this. Ciprofloxacin interacts with his muscle relaxants therefore I prescribed cefdinir 300 twice a day for 7 days. Bactrim coverage of pseudomonas is not reliable although the degree of erythema looks a lot better 01/30/18; the patient has completed his antibiotics. The area does not have infection on the right lateral foot. The wounds look a lot better. We've been using silver alginate 02/12/18; there is on the right lateral foot. Copious amounts of surface eschar. Also similarly over the PIP of the right second toe. We've been using silver alginate to date 03/02/2018; the area that remains is on the right lateral foot. The right second toe is healed. 03/16/2018; the area there is original wound on the right lateral foot. Still requiring debridement. We are using Hydrofera Blue. The right second toe is still healed/closed 03/30/2018; right lateral foot there is 2 small open areas remaining. They actually look quite healthy we have been using Hydrofera Blue the right second toe remains closed 04/13/2018; right lateral foot. 1 of the 2 small open areas from last time has closed over. The other had surface slough that I removed with a #3 curette. This looks quite healthy. The right second toe remains closed. We are using Hydrofera Blue 05/01/2018; right lateral foot very superficial areas that look like they are trying to close over. He had a new threatened area on the  left medial ankle just below the medial malleolus. I think these represent chronic venous insufficiency areas 05/14/2018; the right right lateral foot looks like it closed over as was predicted last time however he has some dark areas subcutaneously erythema superiorly and this is really very tender. This is either coexistent cellulitis or perhaps a deep tissue injury. The area on the left medial ankle is superficial but has some size. We have been using Hydrofera Blue to both areas. The patient complains of pain in his right foot 06/09/2018; right lateral foot was closed over last time but there was coexistent cellulitis around this. I gave him Levaquin. He also has an area on the left medial malleolus. He has not been back to clinic in over 3-1/2 weeks. We are using Hydrofera Blue I think to the left medial malleolus and silver alginate to the right lateral foot 1/21; right lateral foot still non-viable debris over this area and the left medial malleolus. Both of these vigorously washed  off with antiseptic gauze. We have been using silver alginate. 2/4; right lateral foot this looks somewhat better. Unfortunately the area over the left medial malleolus has deteriorated. Using silver alginate in both area 2/18; both wounds arrived today covered in a lot of necrotic debris. We have been using silver alginate. Clearly not making a lot of progress. 3/3; the patient's right second toe is reopened over the PIP. I am not sure why this is happening. He may have microvascular disease. We have checked his macrovascular flow in the past and found it to be normal. He may need an x-ray. The area on the right lateral foot requires debridement. The area on the left lateral ankle is worse. We have been using PolyMem Ag 08/26/18 on evaluation today patient appears to be doing a little worse in regard to his lower extremities based on what I'm seeing what the nursing staff is telling me. His left medial ankle  appears potential to be infected there is your thing on want surrounding the wound. His right second toe is also of concern at this point in my pinion. We have not done x-rays of these areas that may be a good idea to go ahead and proceed with at this point to ensure will not deal with anything such as osteomyelitis. 4/16; it is been a while since I saw this patient. He now has wounds on the right lateral foot, dorsal right second toe, substantially on the left medial malleolus and a small area on the left lateral foot. We have been using PolyMem Silver. Curlex and Coban. He has home health changing the dressing. Since last time he was here he had a culture of the left medial ankle that showed MRSA. He is completed a course/7 days of doxycycline. XRAYS of the left ankle and right foot that were ordered were negative for osteomyelitis 4/30; he arrives in clinic today with some maceration around the wound and a lot of tenderness and erythema in a circumferential area around the right lateral foot. He has wound areas on the right lateral foot dorsal right second toe, largely over the left medial malleolus and a small area over the left lateral foot 5/7; completed his antibiotics from last time right lateral foot cellulitis is better and the area on his left knee which we discovered after he had been seen by the provider also is improved. He still has extensive wound areas over the right lateral foot, left medial malleolus, right second toe and left lateral foot although all of these look somewhat better with PolyMem 5/15. Wounds on the right lateral foot, right second toe left medial malleolus and the left lateral foot. All of these look somewhat better. We have been using PolyMem Ag 5/26; the wound on the right lateral foot is almost closed over but he still complains of a lot of pain in this area. He has a eschared area on the right second toe but no open wound.[name] concerning today his  original wound on the left medial malleolus looks healthy however just inferior to this almost areas of deep tissue injury and there is an area on the left lateral foot also looks like a deep tissue injury. In the past I have wondered whether he might have cholesterol emboli or some other form of a vasculopathy. I wonder whether this could have something to do with this. 6/2; right lateral foot wound is still open I gave him empiric antibiotics last week without really a lot of change  she is still complaining of pain in this area. We x-rayed his feet at some point in April these were negative. It would be difficult to do an MRI. The area on his left medial malleolus looks better. Right second toe is healed. He still has what looks to be a small pressure related area on the left lateral foot but it is not open. We are using PolyMem to all wound which seems to be doing nicely 6/16; right lateral foot wound is still open. The wound looks better although there is still a lot of tenderness around it. Rather than more empiric antibiotics I have cultured this today. We are using PolyMem Ag. On the left lateral malleolus the wound appears better. He has a new area on the left lower anterior pretibial area 6/29; 2-week follow-up. Culture I did of the right lateral foot 2 weeks ago showed MRSA. I gave him 2 weeks of Septra which he should be just about finishing now and this seems better. We got a call from home health last week to report swelling in the left leg because we were listed as his primary. We referred him back to primary care. He arrives today with intense erythema and tenderness around the 2 wounds on the left medial ankle on the left lower calf. The area on the right lateral foot looks better 7/7; I brought him back this week to follow-up on his left foot which had cellulitis last week. I gave him clindamycin which as far as I know he tolerated. The erythema is better. He has a 3 current wounds  one on the right lateral foot a small area on the right lateral malleolus and the more recent area on the left anterior pretibial area. 7/21; bilateral distal lower leg and foot wounds. The area on his right lateral foot looks a lot better. He has an area on his left pretibial area distally. The area on the medial malleolus on the left which is 1 of his worst wounds has closed over. He does not appear to have any open areas in his remaining toes 8/4-Patient returns at 2 weeks, he has the remaining wound on the left lower leg the other 2 wounds have healed. Patient is continued to be treated with silver alginate with a 2 layer wrap on the left 8/18; I been following this patient for a long period for bilateral lower extremity wounds. 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 His wounds are probably pressure although he religiously wears thick bunny boots. He does not have macrovascular disease but I did formal testing on him perhaps 2 or 3 years ago. I have often wondered about either cholesterol emboli or a vasculopathy of some sort given the distal nature of these variable locations etc. He has 2 areas that we have been following one on the left medial lower leg and one on the right lateral foot. Both of these are mostly healed today there is some eschar over sections of both areas although I did not disturb this. 03/31/2019 on evaluation today patient appears to be doing a little bit more poorly in regard to the sacral region. He has 2 small wounds noted at this point. Fortunately there is no signs of active infection at this time. No fevers, chills, nausea, vomiting, or diarrhea. He is having some pain when he sits on his gluteal region a big part of the reason he has these wounds is likely due to the fact  that his wheelchair has not been functioning properly. He does still have a couple areas of deep tissue injury on his lower extremities he  does need new Prevalon offloading boots as well. 04/28/2019 on evaluation today patient appears to be doing well with regard to his wounds in particular. His ankle area has fairly small based on what I am seeing currently and his sacral region actually is still open but does not appear to be doing too poorly in my opinion. Fortunately there is no signs of active infection at this time. He was in the hospital unfortunately and this was from 04/08/2019 through 04/12/2019 secondary to persistent diarrhea, dehydration and acute kidney injury. With that being said fortunately the patient does not seem to be doing too poorly at this time which is good news. He is having pain mainly in the sacral region. 06/23/2019 on evaluation today patient appears to be doing well with regard to his sacral region which in fact is completely healed. Unfortunately he has an area of rubbing/pressure on his right lateral chest/back region. This is where it is rubbing up on his chair. Subsequently he also has an open wound on the left medial malleolus which is not very open but starting to crack and then on the right lateral foot this is much more open at this point. Unfortunately there is no signs of significant improvement in regard to these areas and in fact there does appear to be evidence of active infection quite significantly. No fevers, chills, nausea, vomiting, or diarrhea. The patient did test positive for COVID-19 that has been greater than 14 days ago although they did not know the exact timing. That is why he was not here for his last appointment. Objective Constitutional Well-nourished and well-hydrated in no acute distress. Vitals Time Taken: 11:32 AM, Height: 73 in, Weight: 178 lbs, BMI: 23.5, Temperature: 98.2 F, Pulse: 85 bpm, Respiratory Rate: 18 breaths/min, Blood Pressure: 109/85 mmHg. Respiratory normal breathing without difficulty. Psychiatric Patient is not able to cooperate in decision making  regarding care. Patient is oriented to person and place. pleasant and cooperative. General Notes: His wounds currently in regard to his feet I think are showing signs of significant cellulitis especially on the left although there is really nothing good to culture at that site. For that reason I am actually culture the right foot which is also showing signs of erythema and warmth surrounding. We will subsequently see what this shows and then make a determination at that point based on what we are seeing if we need to make any changes in his regimen. In the meantime I am to see about starting him on doxycycline. Integumentary (Hair, Skin) Wound #34 status is Healed - Epithelialized. Original cause of wound was Pressure Injury. The wound is located on the Sacrum. The wound measures 0cm length x 0cm width x 0cm depth; 0cm^2 area and 0cm^3 volume. There is no tunneling or undermining noted. There is a none present amount of drainage noted. The wound margin is flat and intact. There is no granulation within the wound bed. There is no necrotic tissue within the wound bed. Wound #35 status is Open. Original cause of wound was Pressure Injury. The wound is located on the Left,Medial Malleolus. The wound measures 0.5cm length x 0.7cm width x 0.3cm depth; 0.275cm^2 area and 0.082cm^3 volume. There is Fat Layer (Subcutaneous Tissue) Exposed exposed. There is no tunneling or undermining noted. There is a medium amount of serosanguineous drainage noted. The wound margin is  flat and intact. There is large (67-100%) pink granulation within the wound bed. There is a small (1-33%) amount of necrotic tissue within the wound bed including Adherent Slough. Wound #36 status is Open. Original cause of wound was Shear/Friction. The wound is located on the Right,Lateral Chest. The wound measures 3.5cm length x 2.5cm width x 0.1cm depth; 6.872cm^2 area and 0.687cm^3 volume. There is no tunneling or undermining noted.  There is a medium amount of serosanguineous drainage noted. The wound margin is flat and intact. There is large (67-100%) pink, pale granulation within the wound bed. There is no necrotic tissue within the wound bed. Wound #37 status is Open. Original cause of wound was Pressure Injury. The wound is located on the Right,Lateral Foot. The wound measures 3.5cm length x 2.3cm width x 0.2cm depth; 6.322cm^2 area and 1.264cm^3 volume. There is Fat Layer (Subcutaneous Tissue) Exposed exposed. There is no tunneling or undermining noted. There is a medium amount of serosanguineous drainage noted. The wound margin is flat and intact. There is small (1-33%) pink, pale granulation within the wound bed. There is a large (67-100%) amount of necrotic tissue within the wound bed including Adherent Slough. Assessment Active Problems ICD-10 Pressure ulcer of left ankle, stage 2 Pressure ulcer of other site, stage 2 Pressure ulcer of other site, stage 3 Other specified peripheral vascular diseases Plan Follow-up Appointments: Return Appointment in 2 weeks. Dressing Change Frequency: Wound #35 Left,Medial Malleolus: Change Dressing every other day. - or as needed Wound #36 Right,Lateral Chest: Change Dressing every other day. - or as needed Wound #37 Right,Lateral Foot: Change Dressing every other day. - or as needed Skin Barriers/Peri-Wound Care: Barrier cream - to sacrum as needed Moisturizing lotion - both legs daily Wound Cleansing: Wound #35 Left,Medial Malleolus: May shower and wash wound with soap and water. - with dressing changes Wound #36 Right,Lateral Chest: May shower and wash wound with soap and water. - with dressing changes Wound #37 Right,Lateral Foot: May shower and wash wound with soap and water. - with dressing changes Primary Wound Dressing: Wound #35 Left,Medial Malleolus: Calcium Alginate with Silver Wound #36 Right,Lateral Chest: Xeroform Wound #37 Right,Lateral  Foot: Calcium Alginate with Silver Secondary Dressing: Wound #35 Left,Medial Malleolus: Kerlix/Rolled Gauze Dry Gauze Wound #36 Right,Lateral Chest: Foam Border Wound #37 Right,Lateral Foot: Kerlix/Rolled Gauze Dry Gauze Edema Control: Elevate legs to the level of the heart or above for 30 minutes daily and/or when sitting, a frequency of: Support Garment 10-20 mm/Hg pressure to: - support hose both legs daily Off-Loading: Multipodus Splint to: - sage boots both feet at all times. please float feet to offload pressure to lateral side of feet. Gel mattress overlay (Group 1) - for extended mattress/bed Turn and reposition every 2 hours Home Health: Continue Home Health skilled nursing for wound care. - [facility] Laboratory ordered were: Anerobic culture - right foot The following medication(s) was prescribed: doxycycline hyclate oral 100 mg capsule 1 1 capsule oral taken 2 times a day for 14 days. Do not take calcium or antacid with this medication starting 06/23/2019 1. I recommend currently that we go ahead and get the patient started on an antibiotic based on what I am seeing I think he does have significant cellulitis of his feet bilaterally. I am in a plan on doxycycline at this point. 2. I am in a suggest as well going to suggest as well that we go ahead and initiate treatment with silver alginate to these foot and ankle areas as I  believe that would probably be the best option for him. 3. I am also going to recommend at this point that we go ahead and initiate Xeroform to the other wound which I think is superficial and will heal quite rapidly. 4. We will see what the results of the culture say if I need to make any adjustments depending on the results there we will do so at follow-up. We will see patient back for reevaluation in 1 week here in the clinic. If anything worsens or changes patient will contact our office for additional recommendations. 06/30/2019 I did review the  patient's wound culture results today. Subsequently I determine that he is on appropriate medication as needed for the MRSA which was noted in the culture. The doxycycline should be effective. With that being said Pseudomonas was also potentially present although at this point this appears to be just a rare finding as opposed to the moderate finding of the MRSA. I really feel like the MRSA is more likely to be the issue here and considering the fact as well that there is some contraindications with regard to taking Cipro along with his current medications I am somewhat reluctant to just place him on Cipro at the moment. For that reason I will hold off and see how things are doing next week before making that decision. Hopefully MRSA is the main cause of his issues currently. Electronic Signature(s) Signed: 06/30/2019 7:52:20 PM By: Lenda Kelp PA-C Previous Signature: 06/23/2019 1:16:14 PM Version By: Lenda Kelp PA-C Previous Signature: 06/23/2019 12:27:24 PM Version By: Lenda Kelp PA-C Entered By: Lenda Kelp on 06/30/2019 19:52:20 -------------------------------------------------------------------------------- SuperBill Details Patient Name: Date of Service: BAYLEE, MCCORKEL 06/23/2019 Medical Record [id number] Patient Account Number: [id number] Date of Birth/Sex: Treating RN: 21-Nov-1960 (58 y.o. M) Primary Care Provider: Orpah Cobb Other Clinician: Referring Provider: Treating Provider/Extender:[name], Reginal Lutes, Orlie Pollen in Treatment: 101 Diagnosis Coding ICD-10 Codes Code Description [phone] Pressure ulcer of left ankle, stage 2 L89.892 Pressure ulcer of other site, stage 2 L89.893 Pressure ulcer of other site, stage 3 I73.89 Other specified peripheral vascular diseases Facility Procedures CPT4 Code: [id number] Description: [id number] - WOUND CARE VISIT-LEV 5 EST PT Modifier: Quantity: 1 Physician Procedures CPT4 Code: [id number] Description:  99214 - WC PHYS LEVEL 4 - EST PT ICD-10 Diagnosis Description L89.522 Pressure ulcer of left ankle, stage 2 L89.892 Pressure ulcer of other site, stage 2 L89.893 Pressure ulcer of other site, stage 3 I73.89 Other specified peripheral  vascular diseases Modifier: Quantity: 1 Electronic Signature(s) Signed: 06/23/2019 6:10:09 PM By: Lenda Kelp PA-C Signed: 06/23/2019 6:17:53 PM By: Zenaida Deed RN, BSN Previous Signature: 06/23/2019 12:27:42 PM Version By: Lenda Kelp PA-C Entered By: Zenaida Deed on 06/23/2019 12:29:44

## 2019-06-26 LAB — AEROBIC CULTURE W GRAM STAIN (SUPERFICIAL SPECIMEN): Gram Stain: NONE SEEN

## 2019-06-28 NOTE — Progress Notes (Signed)
0 X - Complex Wound Cleansing - multiple wounds 4 5 X - Wound Imaging (photographs - any number of wounds) 1 5 _0  - Wound Tracing (instead of photographs) 0 _1  - Simple Wound Measurement - one wound 0 X - Complex Wound Measurement - multiple wounds 4 5 INTERVENTIONS - Wound Dressings X - Small Wound Dressing one or multiple wounds 3 10 _2  - Medium Wound Dressing one or multiple wounds 0 _3  - Large Wound Dressing one or multiple wounds 0 X - Application of Medications - topical 1 5 <[id number]>_3<\/[id number]>_2  - Application of Medications - injection 0 INTERVENTIONS - Miscellaneous _5  - External ear exam 0 _6  - Specimen Collection (cultures, biopsies, blood, body fluids, etc.) 0 _7  - Specimen(s) / Culture(s) sent or taken to Lab for analysis 0 _8  - Patient Transfer (multiple staff / [employer] fast streamer / Similar devices) 0 _9  - Simple Staple / Suture removal (25 or less) 0 _10  - Complex Staple / Suture removal (26 or more) 0 _11  - Hypo / Hyperglycemic Management (close monitor of Blood Glucose) 0 _12  - Ankle / Brachial Index (ABI) - do not check if billed separately 0 X - Vital Signs 1 5 Has the patient been seen at the hospital within the last three years: Yes Total Score: 180 Level Of Care: New/Established - Level 5 Electronic Signature(s) Signed: 06/23/2019 6:17:53 PM By: Baruch Gouty RN, BSN Entered By: Baruch Gouty on 06/23/2019 12:29:22 -------------------------------------------------------------------------------- Encounter Discharge Information Details Patient Name: Date of Service: [name], [name] 06/23/2019 10:45 AM Medical Record [id number] Patient Account Number: [id number] Date of Birth/Sex: Treating RN: 1960-10-08 (59 y.o. [name] Primary Care Meiko Ives: [name] Other Clinician: Referring Kaiyla Stahly: Treating  Maylynn Orzechowski/Extender:Stone III, Meredith Staggers in Treatment: 101 Encounter Discharge Information Items Discharge Condition: Stable Ambulatory Status: Wheelchair Discharge Destination: Skilled Nursing Facility Telephoned: No Orders Sent: Yes Transportation: Private Auto Accompanied By: caregiver Schedule Follow-up Yes Appointment: Clinical Summary of Care: Electronic Signature(s) Signed: 06/23/2019 5:26:35 PM By: Deon Pilling Entered By: Deon Pilling on 06/23/2019 17:24:51 -------------------------------------------------------------------------------- Lower Extremity Assessment Details Patient Name: Date of Service: [name], [name] 06/23/2019 10:45 AM Medical Record [id number] Patient Account Number: [id number] Date of Birth/Sex: Treating RN: [birth year] (59 y.o. [name] Primary Care Jayma Volpi: [name] Other Clinician: Referring Halina Asano: Treating Amaal Dimartino/Extender:Stone III, Danny Lawless, Randon Goldsmith in Treatment: 101 Edema Assessment Assessed: [Left: No] [Right: No] Edema: [Left: No] [Right: No] Calf Left: Right: Point of Measurement: cm From Medial Instep 23.6 cm 24.8 cm Ankle Left: Right: Point of Measurement: cm From Medial Instep 16.8 cm 16.7 cm Vascular Assessment Pulses: Dorsalis Pedis Palpable: [Left:Yes] [Right:Yes] Electronic Signature(s) Signed: 06/28/2019 6:39:44 PM By: Levan Hurst RN, BSN Entered By: Levan Hurst on 06/23/2019 12:03:18 -------------------------------------------------------------------------------- [place] Details Patient Name: Date of Service: [name], [name] 06/23/2019 10:45 AM Medical Record [id number] Patient Account Number: [id number] Date of Birth/Sex: Treating RN: 04-24-1961 (59 y.o. [name] Primary Care Braylen Staller: [name] Other Clinician: Referring Danzig Macgregor: Treating Laytoya Ion/Extender:Stone III, Danny Lawless, Randon Goldsmith in Treatment:  101 Active Inactive Pressure Nursing Diagnoses: Knowledge deficit related to causes and risk factors for pressure ulcer development Knowledge deficit related to management of pressures ulcers Potential for impaired tissue integrity related to pressure, friction, moisture, and shear Goals: Patient will remain free from development of additional pressure ulcers Date Initiated: 07/14/2017 Date Inactivated: 12/25/2017 Target Resolution Date: 12/19/2017 Unmet Reason: new Goal Status: Unmet pressure ulcers Patient/caregiver will verbalize risk factors for pressure ulcer development Date Initiated: 07/14/2017  Date Inactivated: 03/02/2018 Target Resolution Date: 02/27/2018 Goal Status: Met Patient/caregiver will verbalize understanding of pressure ulcer management Date Initiated: 07/14/2017 Target Resolution Date: 07/21/2019 Goal Status: Active Interventions: Assess: immobility, friction, shearing, incontinence upon admission and as needed Assess offloading mechanisms upon admission and as needed Assess potential for pressure ulcer upon admission and as needed Provide education on pressure ulcers Notes: Wound/Skin Impairment Nursing Diagnoses: Impaired tissue integrity Knowledge deficit related to ulceration/compromised skin integrity Goals: Patient/caregiver will verbalize understanding of skin care regimen Date Initiated: 07/14/2017 Target Resolution Date: 07/21/2019 Goal Status: Active Ulcer/skin breakdown will have a volume reduction of 30% by week 4 Date Initiated: 07/14/2017 Date Inactivated: 08/18/2017 Target Resolution Date: 08/15/2017 Goal Status: Met Ulcer/skin breakdown will have a volume reduction of 50% by week 8 Date Initiated: 07/14/2017 Date Inactivated: 10/09/2017 Target Resolution Date: 09/19/2017 Goal Status: Unmet Unmet Reason: infection Ulcer/skin breakdown will heal within 14 weeks Date Initiated: 07/14/2017 Date Inactivated: 10/24/2017 Target Resolution Date:  10/24/2017 Unmet Reason: complex Goal Status: Unmet comorbidities Interventions: Assess patient/caregiver ability to obtain necessary supplies Assess patient/caregiver ability to perform ulcer/skin care regimen upon admission and as needed Assess ulceration(s) every visit Provide education on ulcer and skin care Notes: Electronic Signature(s) Signed: 06/23/2019 6:17:53 PM By: Baruch Gouty RN, BSN Entered By: Baruch Gouty on 06/23/2019 12:18:15 -------------------------------------------------------------------------------- Pain Assessment Details Patient Name: Date of Service: [name], [name] 06/23/2019 10:45 AM Medical Record [id number] Patient Account Number: [id number] Date of Birth/Sex: Treating RN: 06/06/1960 (59 y.o. [name] Primary Care Jerimah Witucki: [name] Other Clinician: Referring Wilhelm Ganaway: Treating Rayhana Slider/Extender:Stone III, Danny Lawless, Randon Goldsmith in Treatment: 101 Active Problems Location of Pain Severity and Description of Pain Patient Has Paino Yes Site Locations Pain Location: Pain in Ulcers Rate the pain. Current Pain Level: 6 Character of Pain Describe the Pain: Throbbing Pain Management and Medication Current Pain Management: Medication: Yes Cold Application: No Rest: No Massage: No Activity: No T.E.N.S.: No Heat Application: No Leg drop or elevation: No Is the Current Pain Management Adequate: Adequate How does your wound impact your activities of daily livingo Sleep: No Bathing: No Appetite: No Relationship With Others: No Bladder Continence: No Emotions: No Bowel Continence: No Work: No Toileting: No Drive: No Dressing: No Hobbies: No Electronic Signature(s) Signed: 06/28/2019 6:39:44 PM By: Levan Hurst RN, BSN Entered By: Levan Hurst on 06/23/2019 12:03:12 -------------------------------------------------------------------------------- Patient/Caregiver Education Details Patient Name: [name] 1/27/2021andnbsp10:45 Date of Service: AM Medical Record [id number] Number: Patient Account Number: [id number] Treating RN: [birth year] (58 y.o. Baruch Gouty Date of Birth/Gender: M) Other Clinician: Primary Care Treating MULLIS, Haynes Dage Physician: Physician/Extender: Referring Physician: [name] in Treatment: 101 Education Assessment Education Provided To: Patient Education Topics Provided Infection: Methods: Explain/Verbal Responses: Reinforcements needed, State content correctly Pressure: Methods: Explain/Verbal Responses: Reinforcements needed, State content correctly Wound/Skin Impairment: Methods: Explain/Verbal Responses: Reinforcements needed, State content correctly Electronic Signature(s) Signed: 06/23/2019 6:17:53 PM By: Baruch Gouty RN, BSN Entered By: Baruch Gouty on 06/23/2019 12:19:15 -------------------------------------------------------------------------------- Wound Assessment Details Patient Name: Date of Service: [name], [name] 06/23/2019 10:45 AM Medical Record [id number] Patient Account Number: [id number] Date of Birth/Sex: Treating RN: [birth year] (59 y.o. M) Primary Care Shabreka Coulon: [name] Other Clinician: Referring Kalim Kissel: Treating Isamar Wellbrock/Extender:Stone III, Danny Lawless, Randon Goldsmith in Treatment: 101 Wound Status Wound Number: 34 Primary Pressure Ulcer Etiology: Wound Location: Sacrum Wound Healed - Epithelialized Wounding Event: Pressure Injury Status: Date Acquired: 02/25/2019 Comorbid Deep Vein Thrombosis, Colitis, Weeks Of Treatment: 12 History: Osteomyelitis, Neuropathy, Quadriplegia, Clustered  Date Inactivated: 03/02/2018 Target Resolution Date: 02/27/2018 Goal Status: Met Patient/caregiver will verbalize understanding of pressure ulcer management Date Initiated: 07/14/2017 Target Resolution Date: 07/21/2019 Goal Status: Active Interventions: Assess: immobility, friction, shearing, incontinence upon admission and as needed Assess offloading mechanisms upon admission and as needed Assess potential for pressure ulcer upon admission and as needed Provide education on pressure ulcers Notes: Wound/Skin Impairment Nursing Diagnoses: Impaired tissue integrity Knowledge deficit related to ulceration/compromised skin integrity Goals: Patient/caregiver will verbalize understanding of skin care regimen Date Initiated: 07/14/2017 Target Resolution Date: 07/21/2019 Goal Status: Active Ulcer/skin breakdown will have a volume reduction of 30% by week 4 Date Initiated: 07/14/2017 Date Inactivated: 08/18/2017 Target Resolution Date: 08/15/2017 Goal Status: Met Ulcer/skin breakdown will have a volume reduction of 50% by week 8 Date Initiated: 07/14/2017 Date Inactivated: 10/09/2017 Target Resolution Date: 09/19/2017 Goal Status: Unmet Unmet Reason: infection Ulcer/skin breakdown will heal within 14 weeks Date Initiated: 07/14/2017 Date Inactivated: 10/24/2017 Target Resolution Date:  10/24/2017 Unmet Reason: complex Goal Status: Unmet comorbidities Interventions: Assess patient/caregiver ability to obtain necessary supplies Assess patient/caregiver ability to perform ulcer/skin care regimen upon admission and as needed Assess ulceration(s) every visit Provide education on ulcer and skin care Notes: Electronic Signature(s) Signed: 06/23/2019 6:17:53 PM By: Baruch Gouty RN, BSN Entered By: Baruch Gouty on 06/23/2019 12:18:15 -------------------------------------------------------------------------------- Pain Assessment Details Patient Name: Date of Service: [name], [name] 06/23/2019 10:45 AM Medical Record [id number] Patient Account Number: [id number] Date of Birth/Sex: Treating RN: 06/06/1960 (59 y.o. [name] Primary Care Jerimah Witucki: [name] Other Clinician: Referring Wilhelm Ganaway: Treating Rayhana Slider/Extender:Stone III, Danny Lawless, Randon Goldsmith in Treatment: 101 Active Problems Location of Pain Severity and Description of Pain Patient Has Paino Yes Site Locations Pain Location: Pain in Ulcers Rate the pain. Current Pain Level: 6 Character of Pain Describe the Pain: Throbbing Pain Management and Medication Current Pain Management: Medication: Yes Cold Application: No Rest: No Massage: No Activity: No T.E.N.S.: No Heat Application: No Leg drop or elevation: No Is the Current Pain Management Adequate: Adequate How does your wound impact your activities of daily livingo Sleep: No Bathing: No Appetite: No Relationship With Others: No Bladder Continence: No Emotions: No Bowel Continence: No Work: No Toileting: No Drive: No Dressing: No Hobbies: No Electronic Signature(s) Signed: 06/28/2019 6:39:44 PM By: Levan Hurst RN, BSN Entered By: Levan Hurst on 06/23/2019 12:03:12 -------------------------------------------------------------------------------- Patient/Caregiver Education Details Patient Name: [name] 1/27/2021andnbsp10:45 Date of Service: AM Medical Record [id number] Number: Patient Account Number: [id number] Treating RN: [birth year] (58 y.o. Baruch Gouty Date of Birth/Gender: M) Other Clinician: Primary Care Treating MULLIS, Haynes Dage Physician: Physician/Extender: Referring Physician: [name] in Treatment: 101 Education Assessment Education Provided To: Patient Education Topics Provided Infection: Methods: Explain/Verbal Responses: Reinforcements needed, State content correctly Pressure: Methods: Explain/Verbal Responses: Reinforcements needed, State content correctly Wound/Skin Impairment: Methods: Explain/Verbal Responses: Reinforcements needed, State content correctly Electronic Signature(s) Signed: 06/23/2019 6:17:53 PM By: Baruch Gouty RN, BSN Entered By: Baruch Gouty on 06/23/2019 12:19:15 -------------------------------------------------------------------------------- Wound Assessment Details Patient Name: Date of Service: [name], [name] 06/23/2019 10:45 AM Medical Record [id number] Patient Account Number: [id number] Date of Birth/Sex: Treating RN: [birth year] (59 y.o. M) Primary Care Shabreka Coulon: [name] Other Clinician: Referring Kalim Kissel: Treating Isamar Wellbrock/Extender:Stone III, Danny Lawless, Randon Goldsmith in Treatment: 101 Wound Status Wound Number: 34 Primary Pressure Ulcer Etiology: Wound Location: Sacrum Wound Healed - Epithelialized Wounding Event: Pressure Injury Status: Date Acquired: 02/25/2019 Comorbid Deep Vein Thrombosis, Colitis, Weeks Of Treatment: 12 History: Osteomyelitis, Neuropathy, Quadriplegia, Clustered  0 X - Complex Wound Cleansing - multiple wounds 4 5 X - Wound Imaging (photographs - any number of wounds) 1 5 _0  - Wound Tracing (instead of photographs) 0 _1  - Simple Wound Measurement - one wound 0 X - Complex Wound Measurement - multiple wounds 4 5 INTERVENTIONS - Wound Dressings X - Small Wound Dressing one or multiple wounds 3 10 _2  - Medium Wound Dressing one or multiple wounds 0 _3  - Large Wound Dressing one or multiple wounds 0 X - Application of Medications - topical 1 5 <[id number]>_3<\/[id number]>_2  - Application of Medications - injection 0 INTERVENTIONS - Miscellaneous _5  - External ear exam 0 _6  - Specimen Collection (cultures, biopsies, blood, body fluids, etc.) 0 _7  - Specimen(s) / Culture(s) sent or taken to Lab for analysis 0 _8  - Patient Transfer (multiple staff / [employer] fast streamer / Similar devices) 0 _9  - Simple Staple / Suture removal (25 or less) 0 _10  - Complex Staple / Suture removal (26 or more) 0 _11  - Hypo / Hyperglycemic Management (close monitor of Blood Glucose) 0 _12  - Ankle / Brachial Index (ABI) - do not check if billed separately 0 X - Vital Signs 1 5 Has the patient been seen at the hospital within the last three years: Yes Total Score: 180 Level Of Care: New/Established - Level 5 Electronic Signature(s) Signed: 06/23/2019 6:17:53 PM By: Baruch Gouty RN, BSN Entered By: Baruch Gouty on 06/23/2019 12:29:22 -------------------------------------------------------------------------------- Encounter Discharge Information Details Patient Name: Date of Service: [name], [name] 06/23/2019 10:45 AM Medical Record [id number] Patient Account Number: [id number] Date of Birth/Sex: Treating RN: 1960-10-08 (59 y.o. [name] Primary Care Meiko Ives: [name] Other Clinician: Referring Kaiyla Stahly: Treating  Maylynn Orzechowski/Extender:Stone III, Meredith Staggers in Treatment: 101 Encounter Discharge Information Items Discharge Condition: Stable Ambulatory Status: Wheelchair Discharge Destination: Skilled Nursing Facility Telephoned: No Orders Sent: Yes Transportation: Private Auto Accompanied By: caregiver Schedule Follow-up Yes Appointment: Clinical Summary of Care: Electronic Signature(s) Signed: 06/23/2019 5:26:35 PM By: Deon Pilling Entered By: Deon Pilling on 06/23/2019 17:24:51 -------------------------------------------------------------------------------- Lower Extremity Assessment Details Patient Name: Date of Service: [name], [name] 06/23/2019 10:45 AM Medical Record [id number] Patient Account Number: [id number] Date of Birth/Sex: Treating RN: [birth year] (59 y.o. [name] Primary Care Jayma Volpi: [name] Other Clinician: Referring Halina Asano: Treating Amaal Dimartino/Extender:Stone III, Danny Lawless, Randon Goldsmith in Treatment: 101 Edema Assessment Assessed: [Left: No] [Right: No] Edema: [Left: No] [Right: No] Calf Left: Right: Point of Measurement: cm From Medial Instep 23.6 cm 24.8 cm Ankle Left: Right: Point of Measurement: cm From Medial Instep 16.8 cm 16.7 cm Vascular Assessment Pulses: Dorsalis Pedis Palpable: [Left:Yes] [Right:Yes] Electronic Signature(s) Signed: 06/28/2019 6:39:44 PM By: Levan Hurst RN, BSN Entered By: Levan Hurst on 06/23/2019 12:03:18 -------------------------------------------------------------------------------- [place] Details Patient Name: Date of Service: [name], [name] 06/23/2019 10:45 AM Medical Record [id number] Patient Account Number: [id number] Date of Birth/Sex: Treating RN: 04-24-1961 (59 y.o. [name] Primary Care Braylen Staller: [name] Other Clinician: Referring Danzig Macgregor: Treating Laytoya Ion/Extender:Stone III, Danny Lawless, Randon Goldsmith in Treatment:  101 Active Inactive Pressure Nursing Diagnoses: Knowledge deficit related to causes and risk factors for pressure ulcer development Knowledge deficit related to management of pressures ulcers Potential for impaired tissue integrity related to pressure, friction, moisture, and shear Goals: Patient will remain free from development of additional pressure ulcers Date Initiated: 07/14/2017 Date Inactivated: 12/25/2017 Target Resolution Date: 12/19/2017 Unmet Reason: new Goal Status: Unmet pressure ulcers Patient/caregiver will verbalize risk factors for pressure ulcer development Date Initiated: 07/14/2017  Date Inactivated: 03/02/2018 Target Resolution Date: 02/27/2018 Goal Status: Met Patient/caregiver will verbalize understanding of pressure ulcer management Date Initiated: 07/14/2017 Target Resolution Date: 07/21/2019 Goal Status: Active Interventions: Assess: immobility, friction, shearing, incontinence upon admission and as needed Assess offloading mechanisms upon admission and as needed Assess potential for pressure ulcer upon admission and as needed Provide education on pressure ulcers Notes: Wound/Skin Impairment Nursing Diagnoses: Impaired tissue integrity Knowledge deficit related to ulceration/compromised skin integrity Goals: Patient/caregiver will verbalize understanding of skin care regimen Date Initiated: 07/14/2017 Target Resolution Date: 07/21/2019 Goal Status: Active Ulcer/skin breakdown will have a volume reduction of 30% by week 4 Date Initiated: 07/14/2017 Date Inactivated: 08/18/2017 Target Resolution Date: 08/15/2017 Goal Status: Met Ulcer/skin breakdown will have a volume reduction of 50% by week 8 Date Initiated: 07/14/2017 Date Inactivated: 10/09/2017 Target Resolution Date: 09/19/2017 Goal Status: Unmet Unmet Reason: infection Ulcer/skin breakdown will heal within 14 weeks Date Initiated: 07/14/2017 Date Inactivated: 10/24/2017 Target Resolution Date:  10/24/2017 Unmet Reason: complex Goal Status: Unmet comorbidities Interventions: Assess patient/caregiver ability to obtain necessary supplies Assess patient/caregiver ability to perform ulcer/skin care regimen upon admission and as needed Assess ulceration(s) every visit Provide education on ulcer and skin care Notes: Electronic Signature(s) Signed: 06/23/2019 6:17:53 PM By: Baruch Gouty RN, BSN Entered By: Baruch Gouty on 06/23/2019 12:18:15 -------------------------------------------------------------------------------- Pain Assessment Details Patient Name: Date of Service: [name], [name] 06/23/2019 10:45 AM Medical Record [id number] Patient Account Number: [id number] Date of Birth/Sex: Treating RN: 06/06/1960 (59 y.o. [name] Primary Care Jerimah Witucki: [name] Other Clinician: Referring Wilhelm Ganaway: Treating Rayhana Slider/Extender:Stone III, Danny Lawless, Randon Goldsmith in Treatment: 101 Active Problems Location of Pain Severity and Description of Pain Patient Has Paino Yes Site Locations Pain Location: Pain in Ulcers Rate the pain. Current Pain Level: 6 Character of Pain Describe the Pain: Throbbing Pain Management and Medication Current Pain Management: Medication: Yes Cold Application: No Rest: No Massage: No Activity: No T.E.N.S.: No Heat Application: No Leg drop or elevation: No Is the Current Pain Management Adequate: Adequate How does your wound impact your activities of daily livingo Sleep: No Bathing: No Appetite: No Relationship With Others: No Bladder Continence: No Emotions: No Bowel Continence: No Work: No Toileting: No Drive: No Dressing: No Hobbies: No Electronic Signature(s) Signed: 06/28/2019 6:39:44 PM By: Levan Hurst RN, BSN Entered By: Levan Hurst on 06/23/2019 12:03:12 -------------------------------------------------------------------------------- Patient/Caregiver Education Details Patient Name: [name] 1/27/2021andnbsp10:45 Date of Service: AM Medical Record [id number] Number: Patient Account Number: [id number] Treating RN: [birth year] (58 y.o. Baruch Gouty Date of Birth/Gender: M) Other Clinician: Primary Care Treating MULLIS, Haynes Dage Physician: Physician/Extender: Referring Physician: [name] in Treatment: 101 Education Assessment Education Provided To: Patient Education Topics Provided Infection: Methods: Explain/Verbal Responses: Reinforcements needed, State content correctly Pressure: Methods: Explain/Verbal Responses: Reinforcements needed, State content correctly Wound/Skin Impairment: Methods: Explain/Verbal Responses: Reinforcements needed, State content correctly Electronic Signature(s) Signed: 06/23/2019 6:17:53 PM By: Baruch Gouty RN, BSN Entered By: Baruch Gouty on 06/23/2019 12:19:15 -------------------------------------------------------------------------------- Wound Assessment Details Patient Name: Date of Service: [name], [name] 06/23/2019 10:45 AM Medical Record [id number] Patient Account Number: [id number] Date of Birth/Sex: Treating RN: [birth year] (59 y.o. M) Primary Care Shabreka Coulon: [name] Other Clinician: Referring Kalim Kissel: Treating Isamar Wellbrock/Extender:Stone III, Danny Lawless, Randon Goldsmith in Treatment: 101 Wound Status Wound Number: 34 Primary Pressure Ulcer Etiology: Wound Location: Sacrum Wound Healed - Epithelialized Wounding Event: Pressure Injury Status: Date Acquired: 02/25/2019 Comorbid Deep Vein Thrombosis, Colitis, Weeks Of Treatment: 12 History: Osteomyelitis, Neuropathy, Quadriplegia, Clustered

## 2019-07-07 ENCOUNTER — Encounter (HOSPITAL_BASED_OUTPATIENT_CLINIC_OR_DEPARTMENT_OTHER): Payer: Medicare Other | Attending: Physician Assistant | Admitting: Physician Assistant

## 2019-07-07 ENCOUNTER — Other Ambulatory Visit: Payer: Self-pay

## 2019-07-07 DIAGNOSIS — L89892 Pressure ulcer of other site, stage 2: Secondary | ICD-10-CM | POA: Diagnosis not present

## 2019-07-07 DIAGNOSIS — N5082 Scrotal pain: Secondary | ICD-10-CM | POA: Diagnosis not present

## 2019-07-07 DIAGNOSIS — L89893 Pressure ulcer of other site, stage 3: Secondary | ICD-10-CM | POA: Insufficient documentation

## 2019-07-07 DIAGNOSIS — F419 Anxiety disorder, unspecified: Secondary | ICD-10-CM | POA: Diagnosis not present

## 2019-07-07 DIAGNOSIS — Z8616 Personal history of COVID-19: Secondary | ICD-10-CM | POA: Insufficient documentation

## 2019-07-07 DIAGNOSIS — K529 Noninfective gastroenteritis and colitis, unspecified: Secondary | ICD-10-CM | POA: Insufficient documentation

## 2019-07-07 DIAGNOSIS — L89522 Pressure ulcer of left ankle, stage 2: Secondary | ICD-10-CM | POA: Insufficient documentation

## 2019-07-07 DIAGNOSIS — R532 Functional quadriplegia: Secondary | ICD-10-CM | POA: Insufficient documentation

## 2019-07-07 DIAGNOSIS — Z86718 Personal history of other venous thrombosis and embolism: Secondary | ICD-10-CM | POA: Insufficient documentation

## 2019-07-07 DIAGNOSIS — G809 Cerebral palsy, unspecified: Secondary | ICD-10-CM | POA: Insufficient documentation

## 2019-07-07 DIAGNOSIS — L89152 Pressure ulcer of sacral region, stage 2: Secondary | ICD-10-CM | POA: Insufficient documentation

## 2019-07-07 DIAGNOSIS — L97322 Non-pressure chronic ulcer of left 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: Secondary | ICD-10-CM | POA: Diagnosis not present

## 2019-07-07 DIAGNOSIS — I872 Venous insufficiency (chronic) (peripheral): Secondary | ICD-10-CM | POA: Diagnosis not present

## 2019-07-07 NOTE — Progress Notes (Addendum)
[name] ([id number]) Visit Report for 07/07/2019 Chief Complaint Document Details Patient Name: Date of Service: [name], [name] 07/07/2019 10:45 AM Medical Record [id number] Patient Account Number: [id number] Date of Birth/Sex: Treating RN: 1961/03/16 (59 y.o. [name] Primary Care Provider: Orpah [name] Other Clinician: Referring Provider: Treating Provider/Extender:[name], [name] in Treatment: 103 Information Obtained from: Patient Chief Complaint Patient is here for multiple wounds on his bilateral feet and left ankle. Electronic Signature(s) Signed: 07/07/2019 11:22:11 AM By: Lenda Kelp PA-C Entered By: Lenda Kelp on 07/07/2019 11:22:10 -------------------------------------------------------------------------------- HPI Details Patient Name: Date of Service: [name], [name] 07/07/2019 10:45 AM Medical Record [id number] Patient Account Number: [id number] Date of Birth/Sex: Treating RN: June 08, 1960 (59 y.o. [name] Primary Care Provider: Orpah [name] Other Clinician: Referring Provider: Treating Provider/Extender:[name], [name] in Treatment: 103 History of Present Illness HPI Description: 11/23/15; this is a patient I remember from a previous stay in this clinic in 2012. The patient says this was for a wound in this same area as currently although I have no information to verify that. Most of the information is provided by his caregiver from the group home where he resides. Apparently he has had a wound in this area for 8 months. He also has erythema around this area and has had multiple rounds of antibiotics apparently with some improvement but the erythema is persists. He apparently had a fracture in the ankle 2 months ago and was seen by Dr. Lajoyce Corners . He apparently had a splint applied and more recently a heel offloading boot. He had home health coming out to a week ago  not clear what they are dressing this with but they apparently discharged him perhaps because of one of Dr. 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 He has a Nurse, adult for transferring at home he is nonambulatory and does not wear footwear. He is not a diabetic. Looking through [place] [facility] he appears to have fairly active primary care. The area on the ankle is variably labeled as a pressure area and/or chronic venous insufficiency. He had a recent venous ultrasound on 5/25 that did not show a DVT in the left leg however this was not a reflux study. There was no superficial DVT either. Arterial studies on 10/15/14 showed a left ABI of 1.16 Center right of 1.12 waveforms were triphasic he does not have significant arterial disease. He has had recent x-rays of the left foot and ankle. The left foot did not show any abnormality. Left ankle x-ray showed a spiral fracture of the left tibial diaphysis without evidence of osteomyelitis. 11/30/15 culture I did last week showed pseudomonas I changed him to oral ciprofloxacin. Erythema is a lot better. 12/07/15 area around the wound shows considerable improvement of the periwound erythema. 12/14/15; the patient has a improvement of the periwound erythema which in retrospect I think with cellulitis successfully treated. We have been using Iodoflex started last week 12/25/15 wound on the left medial malleolus and dorsal left foot. Theraskin #1 applied READMISSION 02/27/16; the patient was admitted to hospital from 8/14 through 01/18/16 initially felt to have cellulitis of his left lower leg however he was noted to have significant persistent pain and swelling. A duplex ultrasound was ordered that was negative. Obie Dredge an x-ray was done that showed a spiral fracture of the left leg. With posterior displacement and angulation at the mid left tibia. Orthopedics saw the patient and splinted  the leg. He was then [facility] and only return to his group home last week. He has wounds on the left medial malleolus, left dorsal foot and a worrisome area on the plantar left heel which is not really open but may represent a hematoma or a DTI 03/06/16; areas of left anterior and medial ankle both look improved. There are 3 small wounds here. He has a area over the dorsal left first metatarsal head which is not open. In meantime his left heel has opened 03/14/16 the medial left ankle wounds looks stable to improved. The area on the posterior left heel has opened up and had a very adherent eschar and slept over this 03/21/16 patient has 3 wounds on his left medial ankle is stable area on his left dorsal first metatarsal head appears to be improved. Continued problems with adherent eschar over the left heel 03/28/16; patient has 3 wounds on his left medial ankle and lower leg all of these appear to be stable. The problem continues to be the left heel and the adherent eschar. This may have been a pressure ulcer from her brace at one point. He has a English as a second language teacherbunny boot we have been using [employer] and all wound areas. Dressings are being changed by home health 04/04/16; the patient's areas on the medial aspect of the left ankle looks superficial and appear to be gradually closing. The area on the plantar aspects/Achilles aspect of his left heel as surface slough and nonviable subcutaneous tissue requiring debridement. 04/11/16; the areas on the patient's medial left ankle appear to be superficial and appear to be closing. The area on the plantar heel on the left has surface slough and nonviable subcutaneous tissue requiring debridement. I don't think this is too much different from last week there is also undermining 04/25/16; patient [facility]. The area on the plantar heel is smaller but with still with some depth. We're using Iodoflex to this. The areas on the left  ankle looks healthy but dry and somewhat larger. Cause of this is not really clear using Prisma 05/02/16; areas x3 all look improved expecially the areas on the medial ankle. Heel wound is smaller but still deep 05/09/16; all areas 3 look improved on the medial ankle. Heel wound is also contracting. 12/0.9/17 the areas on his left medial ankle are all closed. I think these are largely chronic venous insufficiency however there is no edema. The area on his left heel was a pressure area. READMISSION 08/02/16; this man is a patient that we have had in this clinic on several times before. I believe he has cerebral palsy is nonambulatory. He has chronic venous insufficiency with venous inflammation. He was last year in the fall of 2017 with areas on his left medial ankle left heel and left dorsal foot. He arrives today with once again a vague history. As mentioned he is nonambulatory he spends most of his day in a pressure offloading boot nevertheless they noted wounds in this area on the medial left ankle 2 weeks ago. There is any other history here I am uncertain 08/15/16; the patient's wounds on his medial ankle actually look better however the area on the left lateral/dorsal foot is covered in black necrotic eschar. Previous formal arterial studies did not show significant arterial disease with normal ABIs and triphasic waveforms. He is immobilized and at risk for pressure although he seems aware protective boots at all times  as far as I am aware. 08/22/16; in general his wounds look better. All the wounds on his medial left ankle look improved the area on his lateral foot however continues to be problematic. Patient comes in the clinic today in a new wheelchair he is quite delighted 09/05/16; a wound on the left medial Calf and left dorsal foot are healed. The area on the left lateral foot is improved however the area on the left medial malleolus is inflamed with a considerable degree of surrounding  erythema. This looks like cellulitis. ABIs and clinical exam are within normal limits 09/12/16; the patient continues to have a wound on the left medial calf. I was concerned about cellulitis in this area last week and gave him a course of doxycycline. The wound looks better here. The area on the left lateral foot however is unchanged 09/20/16; the patient came in this week early after traumatizing his toe on the left foot on a table while moving around in his motorized wheelchair. 09/26/16- he is here for follow-up evaluation of his left foot and malleolus ulcers. Since his appointment he abraded his left medial hallux against a table while in his motorized wheelchair, apparently trying to pull up to the table for a meal. He continues to complain of tenderness to the left lateral foot, surrounding the current ulcer 10/07/16; left foot and malleolus ulcers. 10/14/16; the patient is doing well. He has a wound on the left lateral, left medial malleolus and atraumatic wound on the left great toe. Although these appear to be superficial and closing 10/31/16; the patient is doing well. The area on the left lateral foot, left medial malleolus and the left great toe or all epithelialized. He has home health 11/14/16; patient arrives today with the area on the left great toe healed. The left lateral foot however in the left medial malleolus and the ankle superficial wounds that are slightly larger. He has erythema and tenderness around the area on the left lateral foot. As well he is complaining of dysuria. His caregiver notes that when he gets like this he is more lethargic which indeed seems to be the case today 11/21/16; left great toe is still healed. His wound on the left lateral foot appears healthy and slightly smaller also the area on the left medial ankle. This had black eschar on it which I removed. The remaining wound looks satisfactory 12/12/16; patient hasn't been here in a number of weeks. The area  on the left lateral foot is healed however on his medial ankle has 2 open areas one of them covered with a necrotic eschar. He also has a new wound on the right second toe which was apparently trauma induced while he was at a movie 12/27/16; the patient has wounds on his left medial mallelus and left lateral foot that are just about closed. The area on the right second toe which was trauma from last time is still open but again everything looks a lot better here. 01/30/17; patient arrives in clinic today and is really been a long time since I've seen him. The wounds are all epithelialized on the left foot this includes his left lateral foot, left medial malleolus. Apparently was in the ER for what of I think was a fungal infection on his buttock's he was prescribed Diflucan and doxycycline I think this is already getting better. READMISSION Calvyn is a now 59 year old man who has advanced cerebral palsy and functional quadriplegia. We have had him in this clinic multiple  times before for wounds on his right ankle, left ankle, left dorsal foot and most recently from 08/02/16 through 01/30/17 with his left medial ankle and left foot and finally right second toe. His attendant from the group home where he lives is very knowledgeable about him. She states that they noticed breakdown in his skin in his feet sometime in January although they could not get an appointment until today in this clinic. The previous wounds have always been felt to be secondary to incidental trauma/pressure areas or possibly some degree of chronic venous insufficiency. He had normal arterial studies in may of 2016. He has definite open wounds on the right lateral foot over the base of the fifth metatarsal, the right second toe DIP, the left medial malleolus. He has concerning areas over the left lateral fifth metatarsal base, retaform purpura over the medial left ankle and perhaps the medial left first toe. 07/21/17; culture we did  last week from the base of the right fifth metatarsal grew group G strep. This should've been sensitive to the doxycycline I gave him. The other major wounds are on the right second toe DIP, left medial malleolus, base of the left lateral fifth metatarsal and the medial left ankle and medial left toe. 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 We have macrovascular arterial studies scheduled for Thursday although I don't think this is what the problem is. 07/28/17; patient arrives with really no improvement. He has a wound on the right second toe, lateral right foot, right medial malleolus which was new this week, left medial malleolus, but looked like a DTI on the left great toe. His macrovascular studies are on 07/30/17 although I would be surprised if this provides all the explanation. He has small areas even on his right second and third toe which look like splinter-type hemorrhages. The question I have is where is this coming from. I'd like to see the vascular studies however before we pursue this. He has had a DVT in the past but is no longer on anticoagulants. I'll need to review his history 08/04/17; the patient had his ARTERIAL STUDIES this showed a right ABI of 1.16 and the left ABI of 1.20. TBI on the left at 0.84. He is at a right first toe amputation. The studies were unchanged from studies in May 2016. It was not felt that he had significant lower extremity arterial disease In the meantime we are using silver alginate to the wounds on the right second toe right lateral foot left medial malleolus. I'm going to change to Silver collagen today. 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 Working him up for this would include an echocardiogram probably a CT scan of the chest and abdomen to look at the major thoracic and abdominal aortic vessels. I am not  going to involve myself in this unless there is more symptoms/signs 08/18/17; the patient arrives today with wounds looking somewhat better. The remaining areas are the right second toe dorsal, right lateral foot and left medial malleolus which only has a small open area. We've been using silver collagen 09/01/17;right second toe dorsally probes to bone today. This is a deterioration. We've been using silver collagen. Only a small open area remains on the left medial malleolus. 09/09/17; right second toe appears better. There is no probing bone. He has a small area remaining on the left medial malleolus, the right lateral  foot. We have been using silver collagen 10/07/17; the patient returns to clinic today after a month hiatus. He has wounds over the right second toe, right lateral foot and the left medial malleolus. We have been using silver collagen. He has home health. 10/24/17; patient has open wounds over the right second toe, now the left fourth toe and a superficial area over his left buttock. Some of the other areas have healed including the left medial malleolus and right lateral foot. Using silver collagen on these wounds 11/07/17; the patient has closed his wounds on the right second toe and left fourth toe although they look all normal. Last time he was here he had a superficial area over the left buttock this is also closed. The right lateral foot wound I had closed out last time although it is seems to have reopened today. 628/19 the areas over the right second and left fourth toe remains closed. The right lateral foot has tightly adherent necrotic surface over. We have been using silver collagen he offloads this and a bunny boot 12/12/17; the patient has reopened the recurrent area over the right second PIP joint. 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 He does not have obvious macrovascular disease. The area over the right  lateral foot looks about the same as last time tightly adherent necrotic debris. We've been using Hydrofera Blue 12/25/17; 2 week follow-up. The area over the right second PIP is closed however the area on the right lateral foot is worse with surrounding erythema and pain compatible with cellulitis is going to need an antibiotic. AS WELL..... We have looked at his buttocks and lower back. He has 3 stage II ulcers. The exact history here is unclear however he is very disabled man with cerebral palsy. He goes to a day program from the group home in which she lives 5 days a week and I think he is on his buttock all day on these visits. Surrounding the wounds see has extensive stage I injury as well. FINALLY it looks as though he has lost weight. I'm not the only staff member that is recognized this 01/08/18 on evaluation today patient appears to be doing better in regard to his gluteal wounds. In fact everything appears to be healed back here although very dry. He has a couple open sores on the scrotal region other than this it is really his foot that is still giving him the most trouble. He did complete the Keflex unfortunately it seems like there's still a lot of erythema in this in fact looks worse than it did previous. I'm gonna culture this today and likely start with a different antibiotic. 01/16/18; I was reassured that the wounds on his buttocks and sacrum are healed. I did not look at these the day. The culture that was done last week on 01/11/18 of the right lateral foot wounds showed Pseudomonas. Bactrim is not a good choice for this. Ciprofloxacin interacts with his muscle relaxants therefore I prescribed cefdinir 300 twice a day for 7 days. Bactrim coverage of pseudomonas is not reliable although the degree of erythema looks a lot better 01/30/18; the patient has completed his antibiotics. The area does not have infection on the right lateral foot. The wounds look a lot better. We've been  using silver alginate 02/12/18; there is on the right lateral foot. Copious amounts of surface eschar. Also similarly over the PIP of the right second toe.  We've been using silver alginate to date 03/02/2018; the area that remains is on the right lateral foot. The right second toe is healed. 03/16/2018; the area there is original wound on the right lateral foot. Still requiring debridement. We are using Hydrofera Blue. The right second toe is still healed/closed 03/30/2018; right lateral foot there is 2 small open areas remaining. They actually look quite healthy we have been using Hydrofera Blue the right second toe remains closed 04/13/2018; right lateral foot. 1 of the 2 small open areas from last time has closed over. The other had surface slough that I removed with a #3 curette. This looks quite healthy. The right second toe remains closed. We are using Hydrofera Blue 05/01/2018; right lateral foot very superficial areas that look like they are trying to close over. He had a new threatened area on the left medial ankle just below the medial malleolus. I think these represent chronic venous insufficiency areas 05/14/2018; the right right lateral foot looks like it closed over as was predicted last time however he has some dark areas subcutaneously erythema superiorly and this is really very tender. This is either coexistent cellulitis or perhaps a deep tissue injury. The area on the left medial ankle is superficial but has some size. We have been using Hydrofera Blue to both areas. The patient complains of pain in his right foot 06/09/2018; right lateral foot was closed over last time but there was coexistent cellulitis around this. I gave him Levaquin. He also has an area on the left medial malleolus. He has not been back to clinic in over 3-1/2 weeks. We are using Hydrofera Blue I think to the left medial malleolus and silver alginate to the right lateral foot 1/21; right lateral foot still  non-viable debris over this area and the left medial malleolus. Both of these vigorously washed off with antiseptic gauze. We have been using silver alginate. 2/4; right lateral foot this looks somewhat better. Unfortunately the area over the left medial malleolus has deteriorated. Using silver alginate in both area 2/18; both wounds arrived today covered in a lot of necrotic debris. We have been using silver alginate. Clearly not making a lot of progress. 3/3; the patient's right second toe is reopened over the PIP. I am not sure why this is happening. He may have microvascular disease. We have checked his macrovascular flow in the past and found it to be normal. He may need an x-ray. The area on the right lateral foot requires debridement. The area on the left lateral ankle is worse. We have been using PolyMem Ag 08/26/18 on evaluation today patient appears to be doing a little worse in regard to his lower extremities based on what I'm seeing what the nursing staff is telling me. His left medial ankle appears potential to be infected there is your thing on want surrounding the wound. His right second toe is also of concern at this point in my pinion. We have not done x-rays of these areas that may be a good idea to go ahead and proceed with at this point to ensure will not deal with anything such as osteomyelitis. 4/16; it is been a while since I saw this patient. He now has wounds on the right lateral foot, dorsal right second toe, substantially on the left medial malleolus and a small area on the left lateral foot. We have been using PolyMem Silver. Curlex and Coban. He has home health changing the dressing. Since last time he was  here he had a culture of the left medial ankle that showed MRSA. He is completed a course/7 days of doxycycline. XRAYS of the left ankle and right foot that were ordered were negative for osteomyelitis 4/30; he arrives in clinic today with some maceration around the  wound and a lot of tenderness and erythema in a circumferential area around the right lateral foot. He has wound areas on the right lateral foot dorsal right second toe, largely over the left medial malleolus and a small area over the left lateral foot 5/7; completed his antibiotics from last time right lateral foot cellulitis is better and the area on his left knee which we discovered after he had been seen by the provider also is improved. He still has extensive wound areas over the right lateral foot, left medial malleolus, right second toe and left lateral foot although all of these look somewhat better with PolyMem 5/15. Wounds on the right lateral foot, right second toe left medial malleolus and the left lateral foot. All of these look somewhat better. We have been using PolyMem Ag 5/26; the wound on the right lateral foot is almost closed over but he still complains of a lot of pain in this area. He has a eschared area on the right second toe but no open wound.. Also concerning today his original wound on the left medial malleolus looks healthy however just inferior to this almost areas of deep tissue injury and there is an area on the left lateral foot also looks like a deep tissue injury. In the past I have wondered whether he might have cholesterol emboli or some other form of a vasculopathy. I wonder whether this could have something to do with this. 6/2; right lateral foot wound is still open I gave him empiric antibiotics last week without really a lot of change she is still complaining of pain in this area. We x-rayed his feet at some point in April these were negative. It would be difficult to do an MRI. The area on his left medial malleolus looks better. Right second toe is healed. He still has what looks to be a small pressure related area on the left lateral foot but it is not open. We are using PolyMem to all wound which seems to be doing nicely 6/16; right lateral foot wound  is still open. The wound looks better although there is still a lot of tenderness around it. Rather than more empiric antibiotics I have cultured this today. We are using PolyMem Ag. On the left lateral malleolus the wound appears better. He has a new area on the left lower anterior pretibial area 6/29; 2-week follow-up. Culture I did of the right lateral foot 2 weeks ago showed MRSA. I gave him 2 weeks of Septra which he should be just about finishing now and this seems better. We got a call from home health last week to report swelling in the left leg because we were listed as his primary. We referred him back to primary care. He arrives today with intense erythema and tenderness around the 2 wounds on the left medial ankle on the left lower calf. The area on the right lateral foot looks better 7/7; I brought him back this week to follow-up on his left foot which had cellulitis last week. I gave him clindamycin which as far as I know he tolerated. The erythema is better. He has a 3 current wounds one on the right lateral foot a small area on  the right lateral malleolus and the more recent area on the left anterior pretibial area. 7/21; bilateral distal lower leg and foot wounds. The area on his right lateral foot looks a lot better. He has an area on his left pretibial area distally. The area on the medial malleolus on the left which is 1 of his worst wounds has closed over. He does not appear to have any open areas in his remaining toes 8/4-Patient returns at 2 weeks, he has the remaining wound on the left lower leg the other 2 wounds have healed. Patient is continued to be treated with silver alginate with a 2 layer wrap on the left 8/18; I been following this patient for a long period for bilateral lower extremity wounds. 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 His wounds are probably pressure although he religiously wears thick bunny boots.  He does not have macrovascular disease but I did formal testing on him perhaps 2 or 3 years ago. I have often wondered about either cholesterol emboli or a vasculopathy of some sort given the distal nature of these variable locations etc. He has 2 areas that we have been following one on the left medial lower leg and one on the right lateral foot. Both of these are mostly healed today there is some eschar over sections of both areas although I did not disturb this. 03/31/2019 on evaluation today patient appears to be doing a little bit more poorly in regard to the sacral region. He has 2 small wounds noted at this point. Fortunately there is no signs of active infection at this time. No fevers, chills, nausea, vomiting, or diarrhea. He is having some pain when he sits on his gluteal region a big part of the reason he has these wounds is likely due to the fact that his wheelchair has not been functioning properly. He does still have a couple areas of deep tissue injury on his lower extremities he does need new Prevalon offloading boots as well. 04/28/2019 on evaluation today patient appears to be doing well with regard to his wounds in particular. His ankle area has fairly small based on what I am seeing currently and his sacral region actually is still open but does not appear to be doing too poorly in my opinion. Fortunately there is no signs of active infection at this time. He was in the hospital unfortunately and this was from 04/08/2019 through 04/12/2019 secondary to persistent diarrhea, dehydration and acute kidney injury. With that being said fortunately the patient does not seem to be doing too poorly at this time which is good news. He is having pain mainly in the sacral region. 06/23/2019 on evaluation today patient appears to be doing well with regard to his sacral region which in fact is completely healed. Unfortunately he has an area of rubbing/pressure on his right lateral chest/back  region. This is where it is rubbing up on his chair. Subsequently he also has an open wound on the left medial malleolus which is not very open but starting to crack and then on the right lateral foot this is much more open at this point. Unfortunately there is no signs of significant improvement in regard to these areas and in fact there does appear to be evidence of active infection quite significantly. No fevers, chills, nausea, vomiting, or diarrhea. The patient did test positive for COVID-19 that has been greater than 14 days ago although they did not know  the exact timing. That is why he was not here for his last appointment. 07/07/2019 upon evaluation today patient actually appears to be doing quite well in regard to his left ankle as well as the chest/back region which is doing great in fact appears to be healed in my opinion. He still has an issue on his lateral right foot that is open and appears to be infected but does have me more concerned. In fact I do believe the doxycycline helped in general for the cellulitis but I believe that may have been more MRSA related based on the culture that we did. What I am seeing right now has more of the blue-green drainage and may be more consistent with a Pseudomonas that I was hoping was not really causing much of an issue but I think it may be at this point on the right lateral foot. For that reason I do think treatment would be a good idea of the reason I avoided the Cipro before was the potential for QT prolongation which could obviously cause problems with the patient with results of interaction with respiratory wound. The tizanidine also had some interactions as well. Nonetheless I think at this point that it would be a possibility for [place] to use topical gentamicin to see if this could be of benefit for the patient underneath the silver alginate dressing. Electronic Signature(s) Signed: 07/07/2019 12:07:21 PM By: Lenda Kelp PA-C Entered  By: Lenda Kelp on 07/07/2019 12:07:21 -------------------------------------------------------------------------------- Physical Exam Details Patient Name: Date of Service: [name], [name] 07/07/2019 10:45 AM Medical Record [id number] Patient Account Number: [id number] Date of Birth/Sex: Treating RN: [birth year] (59 y.o. [name] Primary Care Provider: Orpah [name] Other Clinician: Referring Provider: Treating Provider/Extender:[name], [name] in Treatment: 103 Constitutional Well-nourished and well-hydrated in no acute distress. Respiratory normal breathing without difficulty. Psychiatric this patient is able to make decisions and demonstrates good insight into disease process. Alert and Oriented x 3. pleasant and cooperative. Notes Wound bed currently showed signs of good healing for the most part at the left ankle as well as the chest/back location I am very pleased in this regard. Unfortunately with regard to the wound on the right lateral foot I believe this may still be infected this could be the Pseudomonas aspect of what we had seen last when we cultured it 2 weeks ago. Nonetheless I think we can try topical treatment here since the oral Cipro/Levaquin is not good to be a possibility considering that the patient does have interactions with a respite on and tizanidine especially the wrist were done which is quite significant with QT prolongation. Electronic Signature(s) Signed: 07/07/2019 12:08:09 PM By: Lenda Kelp PA-C Entered By: Lenda Kelp on 07/07/2019 12:08:08 -------------------------------------------------------------------------------- Physician Orders Details Patient Name: Date of Service: [name], [name] 07/07/2019 10:45 AM Medical Record [id number] Patient Account Number: [id number] Date of Birth/Sex: Treating RN: 1960/06/01 (58 y.o. [name] Primary Care Provider: Orpah [name] Other  Clinician: Referring Provider: Treating Provider/Extender:[name], [name] in Treatment: [phone] Verbal / Phone Orders: No Diagnosis Coding ICD-10 Coding Code Description [phone] Pressure ulcer of left ankle, stage 2 L89.892 Pressure ulcer of other site, stage 2 L89.893 Pressure ulcer of other site, stage 3 I73.89 Other specified peripheral vascular diseases Follow-up Appointments Return Appointment in 2 weeks. Dressing Change Frequency Wound #37 Right,Lateral Foot Change Dressing every other day. - or as needed Skin Barriers/Peri-Wound Care Barrier cream - to sacrum as needed  Moisturizing lotion - both legs daily Wound Cleansing Wound #37 Right,Lateral Foot May shower and wash wound with soap and water. - with dressing changes Primary Wound Dressing Wound #37 Right,Lateral Foot Calcium Alginate with Silver - thin layer of gentamicin ointment on wound bed under alginate Secondary Dressing Wound #37 Right,Lateral Foot Kerlix/Rolled Gauze Dry Gauze Edema Control Elevate legs to the level of the heart or above for 30 minutes daily and/or when sitting, a frequency of: Support Garment 10-20 mm/Hg pressure to: - support hose both legs daily Off-Loading Multipodus Splint to: - sage boots both feet at all times. please float feet to offload pressure to lateral side of feet. Gel mattress overlay (Group 1) - for extended mattress/bed Turn and reposition every 2 hours Home Health Continue Home Health skilled nursing for wound care. - [facility] Patient Medications Allergies: adhesive tape Notifications Medication Indication Start End gentamicin 07/07/2019 DOSE topical 0.1 % cream - cream topical applied in a thin film over the wound bed underneath the dressing changed every other day as directed in the wound center Electronic Signature(s) Signed: 07/07/2019 12:10:27 PM By: Lenda Kelp PA-C Entered By: Lenda Kelp on 07/07/2019  12:10:26 -------------------------------------------------------------------------------- Problem List Details Patient Name: Date of Service: [name], [name] 07/07/2019 10:45 AM Medical Record [id number] Patient Account Number: [id number] Date of Birth/Sex: Treating RN: 19-Apr-1961 (58 y.o. [name] Primary Care Provider: Orpah [name] Other Clinician: Referring Provider: Treating Provider/Extender:[name], [name] in Treatment: [phone] Active Problems ICD-10 Evaluated Encounter Code Description Active Date Today Diagnosis L89.522 Pressure ulcer of left ankle, stage 2 04/28/2019 No Yes L89.892 Pressure ulcer of other site, stage 2 06/23/2019 No Yes L89.893 Pressure ulcer of other site, stage 3 06/23/2019 No Yes I73.89 Other specified peripheral vascular diseases 08/26/2018 No Yes Inactive Problems ICD-10 Code Description Active Date Inactive Date 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 07/14/2017 07/14/2017 breakdown of skin L03.116 Cellulitis of left lower limb 08/26/2018 08/26/2018 L89.312 Pressure ulcer of right buttock, stage 2 12/25/2017 12/25/2017 L03.115 Cellulitis of right lower limb 12/25/2017 12/25/2017 L89.152 Pressure ulcer of sacral region, stage 2 12/25/2017 12/25/2017 L89.322 Pressure ulcer of left buttock, stage 2 12/25/2017 12/25/2017 L03.116 Cellulitis of left lower limb 11/23/2018 11/23/2018 Resolved Problems ICD-10 Code Description Active Date Resolved Date L89.152 Pressure ulcer of sacral region, stage 2 03/31/2019 03/31/2019 L97.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 12/25/2017 12/25/2017 layer exposed L97.322 Non-pressure chronic ulcer of left 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 05/01/2018 05/01/2018 L97.521 Non-pressure chronic ulcer of other part of left foot limited to 11/10/2018 11/10/2018 breakdown of skin Electronic Signature(s) Signed: 07/07/2019 11:22:01 AM By: Lenda Kelp PA-C Entered  By: Lenda Kelp on 07/07/2019 11:22:00 -------------------------------------------------------------------------------- Progress Note Details Patient Name: Date of Service: [name], [name] 07/07/2019 10:45 AM Medical Record [id number] Patient Account Number: [id number] Date of Birth/Sex: Treating RN: 11-Feb-1961 (59 y.o. [name] Primary Care Provider: Orpah [name] Other Clinician: Referring Provider: Treating Provider/Extender:[name], [name] in Treatment: 103 Subjective Chief Complaint Information obtained from Patient Patient is here for multiple wounds on his bilateral feet and left ankle. History of Present Illness (HPI) 11/23/15; this is a patient I remember from a previous stay in this clinic in 2012. The patient says this was for a wound in this same area as currently although I have no information to verify that. Most of the information is provided by his caregiver from the group  home where he resides. Apparently he has had a wound in this area for 8 months. He also has erythema around this area and has had multiple rounds of antibiotics apparently with some improvement but the erythema is persists. He apparently had a fracture in the ankle 2 months ago and was seen by Dr. Lajoyce Corners . He apparently had a splint applied and more recently a heel offloading boot. He had home health coming out to a week ago not clear what they are dressing this with but they apparently discharged him perhaps because of one of Dr. 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 He has a Nurse, adult for transferring at home he is nonambulatory and does not wear footwear. He is not a diabetic. Looking through [facility] he appears to have fairly active primary care. The area on the ankle is variably labeled as a pressure area and/or chronic venous insufficiency. He had a recent venous ultrasound on 5/25 that did not show a  DVT in the left leg however this was not a reflux study. There was no superficial DVT either. Arterial studies on 10/15/14 showed a left ABI of 1.16 Center right of 1.12 waveforms were triphasic he does not have significant arterial disease. He has had recent x-rays of the left foot and ankle. The left foot did not show any abnormality. Left ankle x-ray showed a spiral fracture of the left tibial diaphysis without evidence of osteomyelitis. 11/30/15 culture I did last week showed pseudomonas I changed him to oral ciprofloxacin. Erythema is a lot better. 12/07/15 area around the wound shows considerable improvement of the periwound erythema. 12/14/15; the patient has a improvement of the periwound erythema which in retrospect I think with cellulitis successfully treated. We have been using Iodoflex started last week 12/25/15 wound on the left medial malleolus and dorsal left foot. Theraskin #1 applied READMISSION 02/27/16; the patient was admitted to hospital from 8/14 through 01/18/16 initially felt to have cellulitis of his left lower leg however he was noted to have significant persistent pain and swelling. A duplex ultrasound was ordered that was negative. Obie Dredge an x-ray was done that showed a spiral fracture of the left leg. With posterior displacement and angulation at the mid left tibia. Orthopedics saw the patient and splinted the leg. He was then sent to [facility] skilled facility and only return to his group home last week. He has wounds on the left medial malleolus, left dorsal foot and a worrisome area on the plantar left heel which is not really open but may represent a hematoma or a DTI 03/06/16; areas of left anterior and medial ankle both look improved. There are 3 small wounds here. He has a area over the dorsal left first metatarsal head which is not open. In meantime his left heel has opened 03/14/16 the medial left ankle wounds looks stable to improved. The area on the posterior left  heel has opened up and had a very adherent eschar and slept over this 03/21/16 patient has 3 wounds on his left medial ankle is stable area on his left dorsal first metatarsal head appears to be improved. Continued problems with adherent eschar over the left heel 03/28/16; patient has 3 wounds on his left medial ankle and lower leg all of these appear to be stable. The problem continues to be the left heel and the adherent eschar. This may have been a pressure ulcer from her brace at one point. He has a Designer, television/film set  boot we have been using [employer] and all wound areas. Dressings are being changed by home health 04/04/16; the patient's areas on the medial aspect of the left ankle looks superficial and appear to be gradually closing. The area on the plantar aspects/Achilles aspect of his left heel as surface slough and nonviable subcutaneous tissue requiring debridement. 04/11/16; the areas on the patient's medial left ankle appear to be superficial and appear to be closing. The area on the plantar heel on the left has surface slough and nonviable subcutaneous tissue requiring debridement. I don't think this is too much different from last week there is also undermining 04/25/16; patient is followed in his group home by [facility] home health. The area on the plantar heel is smaller but with still with some depth. We're using Iodoflex to this. The areas on the left ankle looks healthy but dry and somewhat larger. Cause of this is not really clear using Prisma 05/02/16; areas x3 all look improved expecially the areas on the medial ankle. Heel wound is smaller but still deep 05/09/16; all areas o3 look improved on the medial ankle. Heel wound is also contracting. 12/0.9/17 the areas on his left medial ankle are all closed. I think these are largely chronic venous insufficiency however there is no edema. The area on his left heel was a pressure area. READMISSION 08/02/16; this man is a patient that we have  had in this clinic on several times before. I believe he has cerebral palsy is nonambulatory. He has chronic venous insufficiency with venous inflammation. He was last year in the fall of 2017 with areas on his left medial ankle left heel and left dorsal foot. He arrives today with once again a vague history. As mentioned he is nonambulatory he spends most of his day in a pressure offloading boot nevertheless they noted wounds in this area on the medial left ankle 2 weeks ago. There is any other history here I am uncertain 08/15/16; the patient's wounds on his medial ankle actually look better however the area on the left lateral/dorsal foot is covered in black necrotic eschar. Previous formal arterial studies did not show significant arterial disease with normal ABIs and triphasic waveforms. He is immobilized and at risk for pressure although he seems aware protective boots at all times as far as I am aware. 08/22/16; in general his wounds look better. All the wounds on his medial left ankle look improved the area on his lateral foot however continues to be problematic. Patient comes in the clinic today in a new wheelchair he is quite delighted 09/05/16; a wound on the left medial Calf and left dorsal foot are healed. The area on the left lateral foot is improved however the area on the left medial malleolus is inflamed with a considerable degree of surrounding erythema. This looks like cellulitis. ABIs and clinical exam are within normal limits 09/12/16; the patient continues to have a wound on the left medial calf. I was concerned about cellulitis in this area last week and gave him a course of doxycycline. The wound looks better here. The area on the left lateral foot however is unchanged 09/20/16; the patient came in this week early after traumatizing his toe on the left foot on a table while moving around in his motorized wheelchair. 09/26/16- he is here for follow-up evaluation of his left foot  and malleolus ulcers. Since his appointment he abraded his left medial hallux against a table while in his motorized wheelchair, apparently trying  to pull up to the table for a meal. He continues to complain of tenderness to the left lateral foot, surrounding the current ulcer 10/07/16; left foot and malleolus ulcers. 10/14/16; the patient is doing well. He has a wound on the left lateral, left medial malleolus and atraumatic wound on the left great toe. Although these appear to be superficial and closing 10/31/16; the patient is doing well. The area on the left lateral foot, left medial malleolus and the left great toe or all epithelialized. He has home health 11/14/16; patient arrives today with the area on the left great toe healed. The left lateral foot however in the left medial malleolus and the ankle superficial wounds that are slightly larger. He has erythema and tenderness around the area on the left lateral foot. As well he is complaining of dysuria. His caregiver notes that when he gets like this he is more lethargic which indeed seems to be the case today 11/21/16; left great toe is still healed. His wound on the left lateral foot appears healthy and slightly smaller also the area on the left medial ankle. This had black eschar on it which I removed. The remaining wound looks satisfactory 12/12/16; patient hasn't been here in a number of weeks. The area on the left lateral foot is healed however on his medial ankle has 2 open areas one of them covered with a necrotic eschar. He also has a new wound on the right second toe which was apparently trauma induced while he was at a movie 12/27/16; the patient has wounds on his left medial mallelus and left lateral foot that are just about closed. The area on the right second toe which was trauma from last time is still open but again everything looks a lot better here. 01/30/17; patient arrives in clinic today and is really been a long time since I've  seen him. The wounds are all epithelialized on the left foot this includes his left lateral foot, left medial malleolus. Apparently was in the ER for what of I think was a fungal infection on his buttock's he was prescribed Diflucan and doxycycline I think this is already getting better. READMISSION [name] is a now 59 year old man who has advanced cerebral palsy and functional quadriplegia. We have had him in this clinic multiple times before for wounds on his right ankle, left ankle, left dorsal foot and most recently from 08/02/16 through 01/30/17 with his left medial ankle and left foot and finally right second toe. His attendant from the group home where he lives is very knowledgeable about him. She states that they noticed breakdown in his skin in his feet sometime in January although they could not get an appointment until today in this clinic. The previous wounds have always been felt to be secondary to incidental trauma/pressure areas or possibly some degree of chronic venous insufficiency. He had normal arterial studies in may of 2016. He has definite open wounds on the right lateral foot over the base of the fifth metatarsal, the right second toe DIP, the left medial malleolus. He has concerning areas over the left lateral fifth metatarsal base, retaform purpura over the medial left ankle and perhaps the medial left first toe. 07/21/17; culture we did last week from the base of the right fifth metatarsal grew group G strep. This should've been sensitive to the doxycycline I gave him. The other major wounds are on the right second toe DIP, left medial malleolus, base of the left lateral fifth metatarsal  and the medial left ankle and medial left toe. 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 We have macrovascular arterial studies scheduled for Thursday although I don't think this is what the problem is. 07/28/17; patient arrives with really no improvement. He has a wound on the  right second toe, lateral right foot, right medial malleolus which was new this week, left medial malleolus, but looked like a DTI on the left great toe. His macrovascular studies are on 07/30/17 although I would be surprised if this provides all the explanation. He has small areas even on his right second and third toe which look like splinter-type hemorrhages. The question I have is where is this coming from. I'd like to see the vascular studies however before we pursue this. He has had a DVT in the past but is no longer on anticoagulants. I'll need to review his history 08/04/17; the patient had his ARTERIAL STUDIES this showed a right ABI of 1.16 and the left ABI of 1.20. TBI on the left at 0.84. He is at a right first toe amputation. The studies were unchanged from studies in May 2016. It was not felt that he had significant lower extremity arterial disease In the meantime we are using silver alginate to the wounds on the right second toe right lateral foot left medial malleolus. I'm going to change to Silver collagen today. 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 Working him up for this would include an echocardiogram probably a CT scan of the chest and abdomen to look at the major thoracic and abdominal aortic vessels. I am not going to involve myself in this unless there is more symptoms/signs 08/18/17; the patient arrives today with wounds looking somewhat better. The remaining areas are the right second toe dorsal, right lateral foot and left medial malleolus which only has a small open area. We've been using silver collagen 09/01/17;right second toe dorsally probes to bone today. This is a deterioration. We've been using silver collagen. Only a small open area remains on the left medial malleolus. 09/09/17; right second toe appears better. There is no probing bone.  He has a small area remaining on the left medial malleolus, the right lateral foot. We have been using silver collagen 10/07/17; the patient returns to clinic today after a month hiatus. He has wounds over the right second toe, right lateral foot and the left medial malleolus. We have been using silver collagen. He has home health. 10/24/17; patient has open wounds over the right second toe, now the left fourth toe and a superficial area over his left buttock. Some of the other areas have healed including the left medial malleolus and right lateral foot. Using silver collagen on these wounds 11/07/17; the patient has closed his wounds on the right second toe and left fourth toe although they look all normal. Last time he was here he had a superficial area over the left buttock this is also closed. The right lateral foot wound I had closed out last time although it is seems to have reopened today. 628/19 the areas over the right second and left fourth toe remains closed. The right lateral foot has tightly adherent necrotic surface over. We have been using silver collagen he offloads this and a bunny boot 12/12/17; the patient has reopened the recurrent area over the right second PIP joint. 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 He does not have obvious macrovascular disease. The area over the right lateral foot looks about the same as last time tightly adherent necrotic debris. We've been using Hydrofera Blue 12/25/17; 2 week follow-up. The area over the right second PIP is closed however the area on the right lateral foot is worse with surrounding erythema and pain compatible with cellulitis is going to need an antibiotic. ooAS WELL..... We have looked at his buttocks and lower back. He has 3 stage II ulcers. The exact history here is unclear however he is very disabled man with cerebral palsy. He goes to a day program from the group home  in which she lives 5 days a week and I think he is on his buttock all day on these visits. Surrounding the wounds see has extensive stage I injury as well. ooFINALLY it looks as though he has lost weight. I'm not the only staff member that is recognized this 01/08/18 on evaluation today patient appears to be doing better in regard to his gluteal wounds. In fact everything appears to be healed back here although very dry. He has a couple open sores on the scrotal region other than this it is really his foot that is still giving him the most trouble. He did complete the Keflex unfortunately it seems like there's still a lot of erythema in this in fact looks worse than it did previous. I'm gonna culture this today and likely start with a different antibiotic. 01/16/18; I was reassured that the wounds on his buttocks and sacrum are healed. I did not look at these the day. The culture that was done last week on 01/11/18 of the right lateral foot wounds showed Pseudomonas. Bactrim is not a good choice for this. Ciprofloxacin interacts with his muscle relaxants therefore I prescribed cefdinir 300 twice a day for 7 days. Bactrim coverage of pseudomonas is not reliable although the degree of erythema looks a lot better 01/30/18; the patient has completed his antibiotics. The area does not have infection on the right lateral foot. The wounds look a lot better. We've been using silver alginate 02/12/18; there is on the right lateral foot. Copious amounts of surface eschar. Also similarly over the PIP of the right second toe. We've been using silver alginate to date 03/02/2018; the area that remains is on the right lateral foot. The right second toe is healed. 03/16/2018; the area there is original wound on the right lateral foot. Still requiring debridement. We are using Hydrofera Blue. The right second toe is still healed/closed 03/30/2018; right lateral foot there is 2 small open areas remaining. They actually  look quite healthy we have been using Hydrofera Blue the right second toe remains closed 04/13/2018; right lateral foot. 1 of the 2 small open areas from last time has closed over. The other had surface slough that I removed with a #3 curette. This looks quite healthy. The right second toe remains closed. We are using Hydrofera Blue 05/01/2018; right lateral foot very superficial areas that look like they are trying to close over. He had a new threatened area on the left medial ankle just below the medial malleolus. I think these represent chronic venous insufficiency areas 05/14/2018; the right right lateral foot looks like it closed over as was predicted last time however he has some dark areas subcutaneously erythema superiorly and this is really very tender. This is either coexistent cellulitis or perhaps a deep tissue injury.  The area on the left medial ankle is superficial but has some size. We have been using Hydrofera Blue to both areas. The patient complains of pain in his right foot 06/09/2018; right lateral foot was closed over last time but there was coexistent cellulitis around this. I gave him Levaquin. He also has an area on the left medial malleolus. He has not been back to clinic in over 3-1/2 weeks. We are using Hydrofera Blue I think to the left medial malleolus and silver alginate to the right lateral foot 1/21; right lateral foot still non-viable debris over this area and the left medial malleolus. Both of these vigorously washed off with antiseptic gauze. We have been using silver alginate. 2/4; right lateral foot this looks somewhat better. Unfortunately the area over the left medial malleolus has deteriorated. Using silver alginate in both area 2/18; both wounds arrived today covered in a lot of necrotic debris. We have been using silver alginate. Clearly not making a lot of progress. 3/3; the patient's right second toe is reopened over the PIP. I am not sure why this is  happening. He may have microvascular disease. We have checked his macrovascular flow in the past and found it to be normal. He may need an x-ray. The area on the right lateral foot requires debridement. The area on the left lateral ankle is worse. We have been using PolyMem Ag 08/26/18 on evaluation today patient appears to be doing a little worse in regard to his lower extremities based on what I'm seeing what the nursing staff is telling me. His left medial ankle appears potential to be infected there is your thing on want surrounding the wound. His right second toe is also of concern at this point in my pinion. We have not done x-rays of these areas that may be a good idea to go ahead and proceed with at this point to ensure will not deal with anything such as osteomyelitis. 4/16; it is been a while since I saw this patient. He now has wounds on the right lateral foot, dorsal right second toe, substantially on the left medial malleolus and a small area on the left lateral foot. We have been using PolyMem Silver. Curlex and Coban. He has home health changing the dressing. Since last time he was here he had a culture of the left medial ankle that showed MRSA. He is completed a course/7 days of doxycycline. XRAYS of the left ankle and right foot that were ordered were negative for osteomyelitis 4/30; he arrives in clinic today with some maceration around the wound and a lot of tenderness and erythema in a circumferential area around the right lateral foot. He has wound areas on the right lateral foot dorsal right second toe, largely over the left medial malleolus and a small area over the left lateral foot 5/7; completed his antibiotics from last time right lateral foot cellulitis is better and the area on his left knee which we discovered after he had been seen by the provider also is improved. He still has extensive wound areas over the right lateral foot, left medial malleolus, right second toe  and left lateral foot although all of these look somewhat better with PolyMem 5/15. Wounds on the right lateral foot, right second toe left medial malleolus and the left lateral foot. All of these look somewhat better. We have been using PolyMem Ag 5/26; the wound on the right lateral foot is almost closed over but he still complains of a  lot of pain in this area. He has a eschared area on the right second toe but no open wound.[name] concerning today his original wound on the left medial malleolus looks healthy however just inferior to this almost areas of deep tissue injury and there is an area on the left lateral foot also looks like a deep tissue injury. In the past I have wondered whether he might have cholesterol emboli or some other form of a vasculopathy. I wonder whether this could have something to do with this. 6/2; right lateral foot wound is still open I gave him empiric antibiotics last week without really a lot of change she is still complaining of pain in this area. We x-rayed his feet at some point in April these were negative. It would be difficult to do an MRI. The area on his left medial malleolus looks better. Right second toe is healed. He still has what looks to be a small pressure related area on the left lateral foot but it is not open. We are using PolyMem to all wound which seems to be doing nicely 6/16; right lateral foot wound is still open. The wound looks better although there is still a lot of tenderness around it. Rather than more empiric antibiotics I have cultured this today. We are using PolyMem Ag. On the left lateral malleolus the wound appears better. He has a new area on the left lower anterior pretibial area 6/29; 2-week follow-up. Culture I did of the right lateral foot 2 weeks ago showed MRSA. I gave him 2 weeks of Septra which he should be just about finishing now and this seems better. We got a call from home health last week to report swelling in  the left leg because we were listed as his primary. We referred him back to primary care. He arrives today with intense erythema and tenderness around the 2 wounds on the left medial ankle on the left lower calf. The area on the right lateral foot looks better 7/7; I brought him back this week to follow-up on his left foot which had cellulitis last week. I gave him clindamycin which as far as I know he tolerated. The erythema is better. He has a 3 current wounds one on the right lateral foot a small area on the right lateral malleolus and the more recent area on the left anterior pretibial area. 7/21; bilateral distal lower leg and foot wounds. The area on his right lateral foot looks a lot better. He has an area on his left pretibial area distally. The area on the medial malleolus on the left which is 1 of his worst wounds has closed over. He does not appear to have any open areas in his remaining toes 8/4-Patient returns at 2 weeks, he has the remaining wound on the left lower leg the other 2 wounds have healed. Patient is continued to be treated with silver alginate with a 2 layer wrap on the left 8/18; I been following this patient for a long period for bilateral lower extremity wounds. 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 His wounds are probably pressure although he religiously wears thick bunny boots. He does not have macrovascular disease but I did formal testing on him perhaps 2 or 3 years ago. I have often wondered about either cholesterol emboli or a vasculopathy of some sort given the distal nature of these variable locations etc. He has 2 areas that we have been  following one on the left medial lower leg and one on the right lateral foot. Both of these are mostly healed today there is some eschar over sections of both areas although I did not disturb this. 03/31/2019 on evaluation today patient appears to be doing a little bit more  poorly in regard to the sacral region. He has 2 small wounds noted at this point. Fortunately there is no signs of active infection at this time. No fevers, chills, nausea, vomiting, or diarrhea. He is having some pain when he sits on his gluteal region a big part of the reason he has these wounds is likely due to the fact that his wheelchair has not been functioning properly. He does still have a couple areas of deep tissue injury on his lower extremities he does need new Prevalon offloading boots as well. 04/28/2019 on evaluation today patient appears to be doing well with regard to his wounds in particular. His ankle area has fairly small based on what I am seeing currently and his sacral region actually is still open but does not appear to be doing too poorly in my opinion. Fortunately there is no signs of active infection at this time. He was in the hospital unfortunately and this was from 04/08/2019 through 04/12/2019 secondary to persistent diarrhea, dehydration and acute kidney injury. With that being said fortunately the patient does not seem to be doing too poorly at this time which is good news. He is having pain mainly in the sacral region. 06/23/2019 on evaluation today patient appears to be doing well with regard to his sacral region which in fact is completely healed. Unfortunately he has an area of rubbing/pressure on his right lateral chest/back region. This is where it is rubbing up on his chair. Subsequently he also has an open wound on the left medial malleolus which is not very open but starting to crack and then on the right lateral foot this is much more open at this point. Unfortunately there is no signs of significant improvement in regard to these areas and in fact there does appear to be evidence of active infection quite significantly. No fevers, chills, nausea, vomiting, or diarrhea. The patient did test positive for COVID-19 that has been greater than 14 days ago  although they did not know the exact timing. That is why he was not here for his last appointment. 07/07/2019 upon evaluation today patient actually appears to be doing quite well in regard to his left ankle as well as the chest/back region which is doing great in fact appears to be healed in my opinion. He still has an issue on his lateral right foot that is open and appears to be infected but does have me more concerned. In fact I do believe the doxycycline helped in general for the cellulitis but I believe that may have been more MRSA related based on the culture that we did. What I am seeing right now has more of the blue-green drainage and may be more consistent with a Pseudomonas that I was hoping was not really causing much of an issue but I think it may be at this point on the right lateral foot. For that reason I do think treatment would be a good idea of the reason I avoided the Cipro before was the potential for QT prolongation which could obviously cause problems with the patient with results of interaction with respiratory wound. The tizanidine also had some interactions as well. Nonetheless I think at this  point that it would be a possibility for [place] to use topical gentamicin to see if this could be of benefit for the patient underneath the silver alginate dressing. Objective Constitutional Well-nourished and well-hydrated in no acute distress. Vitals Time Taken: 11:16 AM, Height: 73 in, Weight: 178 lbs, BMI: 23.5, Temperature: 97.7 F, Pulse: 78 bpm, Respiratory Rate: 16 breaths/min, Blood Pressure: 124/86 mmHg. Respiratory normal breathing without difficulty. Psychiatric this patient is able to make decisions and demonstrates good insight into disease process. Alert and Oriented x 3. pleasant and cooperative. General Notes: Wound bed currently showed signs of good healing for the most part at the left ankle as well as the chest/back location I am very pleased in this regard.  Unfortunately with regard to the wound on the right lateral foot I believe this may still be infected this could be the Pseudomonas aspect of what we had seen last when we cultured it 2 weeks ago. Nonetheless I think we can try topical treatment here since the oral Cipro/Levaquin is not good to be a possibility considering that the patient does have interactions with a respite on and tizanidine especially the wrist were done which is quite significant with QT prolongation. Integumentary (Hair, Skin) Wound #35 status is Healed - Epithelialized. Original cause of wound was Pressure Injury. The wound is located on the Left,Medial Malleolus. The wound measures 0cm length x 0cm width x 0cm depth; 0cm^2 area and 0cm^3 volume. There is no tunneling or undermining noted. There is a none present amount of drainage noted. The wound margin is flat and intact. There is no granulation within the wound bed. There is no necrotic tissue within the wound bed. Wound #36 status is Healed - Epithelialized. Original cause of wound was Shear/Friction. The wound is located on the Right,Lateral Chest. The wound measures 0cm length x 0cm width x 0cm depth; 0cm^2 area and 0cm^3 volume. There is no tunneling or undermining noted. There is a none present amount of drainage noted. The wound margin is flat and intact. There is no granulation within the wound bed. There is no necrotic tissue within the wound bed. Wound #37 status is Open. Original cause of wound was Pressure Injury. The wound is located on the Right,Lateral Foot. The wound measures 3.2cm length x 2.8cm width x 0.2cm depth; 7.037cm^2 area and 1.407cm^3 volume. There is Fat Layer (Subcutaneous Tissue) Exposed exposed. There is no tunneling or undermining noted. There is a medium amount of serosanguineous drainage noted. The wound margin is flat and intact. There is small (1-33%) pink granulation within the wound bed. There is a large (67-100%) amount of necrotic  tissue within the wound bed including Adherent Slough. Assessment Active Problems ICD-10 Pressure ulcer of left ankle, stage 2 Pressure ulcer of other site, stage 2 Pressure ulcer of other site, stage 3 Other specified peripheral vascular diseases Plan Follow-up Appointments: Return Appointment in 2 weeks. Dressing Change Frequency: Wound #37 Right,Lateral Foot: Change Dressing every other day. - or as needed Skin Barriers/Peri-Wound Care: Barrier cream - to sacrum as needed Moisturizing lotion - both legs daily Wound Cleansing: Wound #37 Right,Lateral Foot: May shower and wash wound with soap and water. - with dressing changes Primary Wound Dressing: Wound #37 Right,Lateral Foot: Calcium Alginate with Silver - thin layer of gentamicin ointment on wound bed under alginate Secondary Dressing: Wound #37 Right,Lateral Foot: Kerlix/Rolled Gauze Dry Gauze Edema Control: Elevate legs to the level of the heart or above for 30 minutes daily and/or when sitting, a  frequency of: Support Garment 10-20 mm/Hg pressure to: - support hose both legs daily Off-Loading: Multipodus Splint to: - sage boots both feet at all times. please float feet to offload pressure to lateral side of feet. Gel mattress overlay (Group 1) - for extended mattress/bed Turn and reposition every 2 hours Home Health: Continue Home Health skilled nursing for wound care. - [facility] The following medication(s) was prescribed: gentamicin topical 0.1 % cream cream topical applied in a thin film over the wound bed underneath the dressing changed every other day as directed in the wound center starting 07/07/2019 1. I would recommend currently that we go ahead and send in a prescription for gentamicin for the patient I did do that today. 2. I am also going to recommend that we go ahead and continue with the silver alginate dressing over top of the gentamicin and then we will subsequently cover this with a border foam  dressing I think that would do quite well and help pad the area. 3. I do recommend continued and appropriate offloading as well of the foot I think that skin to be of utmost importance. Obviously this is a pressure location and needs to be well protected. We will see patient back for reevaluation in 1 week here in the clinic. If anything worsens or changes patient will contact our office for additional recommendations. Electronic Signature(s) Signed: 07/07/2019 12:10:52 PM By: Lenda Kelp PA-C Entered By: Lenda Kelp on 07/07/2019 12:10:52 -------------------------------------------------------------------------------- SuperBill Details Patient Name: Date of Service: [name], [name] 07/07/2019 Medical Record [id number] Patient Account Number: [id number] Date of Birth/Sex: Treating RN: 1961-02-03 (59 y.o. [name] Primary Care Provider: Orpah [name] Other Clinician: Referring Provider: Treating Provider/Extender:[name], [name] in Treatment: 103 Diagnosis Coding ICD-10 Codes Code Description [phone] Pressure ulcer of left ankle, stage 2 L89.892 Pressure ulcer of other site, stage 2 L89.893 Pressure ulcer of other site, stage 3 I73.89 Other specified peripheral vascular diseases Facility Procedures CPT4 Code: [id number] Description: 99214 - WOUND CARE VISIT-LEV 4 EST PT Modifier: Quantity: 1 Physician Procedures CPT4 Code: [id number] Description: 99214 - WC PHYS LEVEL 4 - EST PT ICD-10 Diagnosis Description L89.522 Pressure ulcer of left ankle, stage 2 L89.892 Pressure ulcer of other site, stage 2 L89.893 Pressure ulcer of other site, stage 3 I73.89 Other specified peripheral  vascular diseases Modifier: Quantity: 1 Electronic Signature(s) Signed: 07/07/2019 12:11:08 PM By: Lenda Kelp PA-C Entered By: Lenda Kelp on 07/07/2019 12:11:08

## 2019-07-12 NOTE — Progress Notes (Signed)
BSN Previous Signature: 07/07/2019 5:39:41 PM Version By: Alla German Entered By: Mikeal Hawthorne on 02/11/ After Cleansing: No rino No Exposed Structure osed: No (Subcutaneous Tissue) Exposed: No osed: No osed: No sed:  No ed: No nda RN, BSN 2021 11:32:13 in Area: 100% in Volume: 100% zation: Large (67-100%) No g: No -------------------------------------------------------------------------------- Wound Assessment Details Patient Name: Date of Service: [name], [name] 07/07/2019 10:45 AM Medical Record [id number] Patient Account Number: [id number] Date of Birth/Sex: Treating RN: 1960-12-05 (59 y.o. [name] Primary Care Jadis Mika: [name] Other Clinician: Referring Cloyd Ragas: Treating Jaccob Czaplicki/Extender:Stone III, Danny Lawless, Randon Goldsmith in Treatment: 103 Wound Status Wound Number: 36 Primary Pressure Ulcer Etiology: Wound Location: Right Chest - Lateral Wound Healed - Epithelialized Wounding Event: Shear/Friction Status: Date Acquired: 06/14/2019 Comorbid Deep Vein Thrombosis, Colitis, Weeks Of Treatment: 2 History: Osteomyelitis, Neuropathy, Quadriplegia, Clustered Wound: No Confinement Anxiety Photos Wound Measurements Length: (cm) 0 % Reduction Width: (cm) 0 % Reduction Depth: (cm) 0 Epitheliali Area: (cm) 0 Tunneling: Volume: (cm) 0 Underminin Wound Description Classification: Category/Stage II Foul Odor Wound Margin: Flat and Intact Slough/Fib Exudate Amount: None Present Wound Bed Granulation Amount: None Present (0%) Necrotic Amount: None Present (0%) Fascia Exp Fat Layer Tendon Exp Muscle Exp Joint Expo Bone Expos After Cleansing: No rino No Exposed Structure osed: No (Subcutaneous Tissue) Exposed: No osed: No osed: No sed: No ed: No in Area: 100% in Volume: 100% zation: Large (67-100%) No g: No Electronic Signature(s) Signed: 07/08/2019 4:30:47 PM By: Mikeal Hawthorne EMT/HBOT Signed: 07/09/2019 5:30:09 PM By: Baruch Gouty RN, BSN Previous Signature: 07/07/2019 5:39:41 PM Version By: Baruch Gouty RN, BSN Entered By: Mikeal Hawthorne on 07/08/2019  11:33:18 -------------------------------------------------------------------------------- Wound Assessment Details Patient Name: Date of Service: [name], [name] 07/07/2019 10:45 AM Medical Record [id number] Patient Account Number: [id number] Date of Birth/Sex: Treating RN: [birth year] (59 y.o. [name] Primary Care Ho Parisi: [name] Other Clinician: Referring Tyquez Hollibaugh: Treating Fynlee Rowlands/Extender:Stone III, Danny Lawless, Randon Goldsmith in Treatment: 103 Wound Status Wound Number: 37 Primary Pressure Ulcer Etiology: Wound Location: Right Foot - Lateral Wound Open Wounding Event: Pressure Injury Status: Date Acquired: 05/24/2019 Comorbid Deep Vein Thrombosis, Colitis, Weeks Of Treatment: 2 History: Osteomyelitis, Neuropathy, Quadriplegia, Clustered Wound: No Confinement Anxiety Photos Wound Measurements Length: (cm) 3.2 Width: (cm) 2.8 Depth: (cm) 0.2 Area: (cm) 7.037 Volume: (cm) 1.407 Wound Description Classification: Category/Stage III Wound Margin: Flat and Intact Exudate Amount: Medium Exudate Type: Serosanguineous Exudate Color: red, brown Wound Bed Granulation Amount: Small (1-33%) Granulation Quality: Pink Necrotic Amount: Large (67-100%) Necrotic Quality: Adherent Slough Foul Odor After Cleansing: No Slough/Fibrino Yes Exposed Structure Fascia Exposed: N Fat Layer (Subcutaneous Tissue) Exposed: Y Tendon Exposed: N Muscle Exposed: N Joint Exposed: N Bone Exposed: N % Reduction in Area: -11.3% % Reduction in Volume: -11.3% Epithelialization: None Tunneling: No Undermining: No o es o o o o Treatment Notes Wound #37 (Right, Lateral Foot) 1. Cleanse With Wound Cleanser 2. Periwound Care Moisturizing lotion 3. Primary Dressing Applied Calcium Alginate Ag Other primary dressing (specifiy in notes) 4. Secondary Dressing Dry Gauze Roll Gauze 5. Secured With Medipore tape 7. Footwear/Offloading device  applied Multipodus Splint/Boot Electronic Signature(s) Signed: 07/08/2019 4:30:47 PM By: Mikeal Hawthorne EMT/HBOT Signed: 07/09/2019 5:30:09 PM By: Baruch Gouty RN, BSN Previous Signature: 07/07/2019 5:39:41 PM Version By: Baruch Gouty RN, BSN Entered By: Mikeal Hawthorne on 07/08/2019 11:32:38 -------------------------------------------------------------------------------- Vitals Details Patient Name: Date of Service: [name], [name] 07/07/2019 10:45 AM Medical Record [id number] Patient Account Number: [id number] Date of Birth/Sex:  Treating RN: 11-25-1960 (59 y.o. [name] Primary Care Jaicob Dia: [name] Other Clinician: Referring Antwuan Eckley: Treating Jamil Armwood/Extender:Stone III, Danny Lawless, Randon Goldsmith in Treatment: 103 Vital Signs Time Taken: 11:16 Temperature (F): 97.7 Height (in): 73 Pulse (bpm): 78 Weight (lbs): 178 Respiratory Rate (breaths/min): 16 Body Mass Index (BMI): 23.5 Blood Pressure (mmHg): 124/86 Reference Range: 80 - 120 mg / dl Electronic Signature(s) Signed: 07/12/2019 6:14:31 PM By: Levan Hurst RN, BSN Entered By: Levan Hurst on 07/07/2019 11:17:01  BSN Previous Signature: 07/07/2019 5:39:41 PM Version By: Alla German Entered By: Mikeal Hawthorne on 02/11/ After Cleansing: No rino No Exposed Structure osed: No (Subcutaneous Tissue) Exposed: No osed: No osed: No sed:  No ed: No nda RN, BSN 2021 11:32:13 in Area: 100% in Volume: 100% zation: Large (67-100%) No g: No -------------------------------------------------------------------------------- Wound Assessment Details Patient Name: Date of Service: [name], [name] 07/07/2019 10:45 AM Medical Record [id number] Patient Account Number: [id number] Date of Birth/Sex: Treating RN: 1960-12-05 (59 y.o. [name] Primary Care Jadis Mika: [name] Other Clinician: Referring Cloyd Ragas: Treating Jaccob Czaplicki/Extender:Stone III, Danny Lawless, Randon Goldsmith in Treatment: 103 Wound Status Wound Number: 36 Primary Pressure Ulcer Etiology: Wound Location: Right Chest - Lateral Wound Healed - Epithelialized Wounding Event: Shear/Friction Status: Date Acquired: 06/14/2019 Comorbid Deep Vein Thrombosis, Colitis, Weeks Of Treatment: 2 History: Osteomyelitis, Neuropathy, Quadriplegia, Clustered Wound: No Confinement Anxiety Photos Wound Measurements Length: (cm) 0 % Reduction Width: (cm) 0 % Reduction Depth: (cm) 0 Epitheliali Area: (cm) 0 Tunneling: Volume: (cm) 0 Underminin Wound Description Classification: Category/Stage II Foul Odor Wound Margin: Flat and Intact Slough/Fib Exudate Amount: None Present Wound Bed Granulation Amount: None Present (0%) Necrotic Amount: None Present (0%) Fascia Exp Fat Layer Tendon Exp Muscle Exp Joint Expo Bone Expos After Cleansing: No rino No Exposed Structure osed: No (Subcutaneous Tissue) Exposed: No osed: No osed: No sed: No ed: No in Area: 100% in Volume: 100% zation: Large (67-100%) No g: No Electronic Signature(s) Signed: 07/08/2019 4:30:47 PM By: Mikeal Hawthorne EMT/HBOT Signed: 07/09/2019 5:30:09 PM By: Baruch Gouty RN, BSN Previous Signature: 07/07/2019 5:39:41 PM Version By: Baruch Gouty RN, BSN Entered By: Mikeal Hawthorne on 07/08/2019  11:33:18 -------------------------------------------------------------------------------- Wound Assessment Details Patient Name: Date of Service: [name], [name] 07/07/2019 10:45 AM Medical Record [id number] Patient Account Number: [id number] Date of Birth/Sex: Treating RN: [birth year] (59 y.o. [name] Primary Care Ho Parisi: [name] Other Clinician: Referring Tyquez Hollibaugh: Treating Fynlee Rowlands/Extender:Stone III, Danny Lawless, Randon Goldsmith in Treatment: 103 Wound Status Wound Number: 37 Primary Pressure Ulcer Etiology: Wound Location: Right Foot - Lateral Wound Open Wounding Event: Pressure Injury Status: Date Acquired: 05/24/2019 Comorbid Deep Vein Thrombosis, Colitis, Weeks Of Treatment: 2 History: Osteomyelitis, Neuropathy, Quadriplegia, Clustered Wound: No Confinement Anxiety Photos Wound Measurements Length: (cm) 3.2 Width: (cm) 2.8 Depth: (cm) 0.2 Area: (cm) 7.037 Volume: (cm) 1.407 Wound Description Classification: Category/Stage III Wound Margin: Flat and Intact Exudate Amount: Medium Exudate Type: Serosanguineous Exudate Color: red, brown Wound Bed Granulation Amount: Small (1-33%) Granulation Quality: Pink Necrotic Amount: Large (67-100%) Necrotic Quality: Adherent Slough Foul Odor After Cleansing: No Slough/Fibrino Yes Exposed Structure Fascia Exposed: N Fat Layer (Subcutaneous Tissue) Exposed: Y Tendon Exposed: N Muscle Exposed: N Joint Exposed: N Bone Exposed: N % Reduction in Area: -11.3% % Reduction in Volume: -11.3% Epithelialization: None Tunneling: No Undermining: No o es o o o o Treatment Notes Wound #37 (Right, Lateral Foot) 1. Cleanse With Wound Cleanser 2. Periwound Care Moisturizing lotion 3. Primary Dressing Applied Calcium Alginate Ag Other primary dressing (specifiy in notes) 4. Secondary Dressing Dry Gauze Roll Gauze 5. Secured With Medipore tape 7. Footwear/Offloading device  applied Multipodus Splint/Boot Electronic Signature(s) Signed: 07/08/2019 4:30:47 PM By: Mikeal Hawthorne EMT/HBOT Signed: 07/09/2019 5:30:09 PM By: Baruch Gouty RN, BSN Previous Signature: 07/07/2019 5:39:41 PM Version By: Baruch Gouty RN, BSN Entered By: Mikeal Hawthorne on 07/08/2019 11:32:38 -------------------------------------------------------------------------------- Vitals Details Patient Name: Date of Service: [name], [name] 07/07/2019 10:45 AM Medical Record [id number] Patient Account Number: [id number] Date of Birth/Sex:  X - Complex Wound Cleansing - multiple wounds 3 5 X - Wound Imaging (photographs - any number of wounds) 1 5 []  - Wound Tracing (instead of photographs) 0 []  - Simple Wound Measurement - one wound 0 X - Complex Wound Measurement - multiple wounds 3 5 INTERVENTIONS - Wound Dressings X - Small Wound Dressing one or multiple wounds 1 10 []  - Medium Wound Dressing one or multiple wounds 0 []  - Large Wound Dressing one or multiple wounds 0 X - Application of Medications - topical 1 5 []  - Application of Medications - injection 0 INTERVENTIONS - Miscellaneous []  - External ear exam 0 []  - Specimen Collection (cultures, biopsies, blood, body fluids, etc.) 0 []  - Specimen(s) / Culture(s) sent or taken to Lab for analysis 0 []  - Patient Transfer (multiple staff / [employer] fast streamer / Similar devices) 0 []  - Simple Staple / Suture removal (25 or less) 0 []  - Complex Staple / Suture removal (26 or more) 0 []  - Hypo / Hyperglycemic Management (close monitor of Blood Glucose) 0 []  - Ankle / Brachial Index (ABI) - do not check if billed separately 0 X - Vital Signs 1 5 Has the patient been seen at the hospital within the last three years: Yes Total Score: 140 Level Of Care: New/Established - Level 4 Electronic Signature(s) Signed: 07/07/2019 5:39:41 PM By: Baruch Gouty RN, BSN Entered By: Baruch Gouty on 07/07/2019 12:04:31 -------------------------------------------------------------------------------- Encounter Discharge Information Details Patient Name: Date of Service: [name], [name] 07/07/2019 10:45 AM Medical Record [id number] Patient Account Number: [id number] Date of Birth/Sex: Treating RN: February 11, 1961 (59 y.o. [name] Primary Care Avah Bashor: [name] Other Clinician: Referring Joneric Streight: Treating  Casanova Schurman/Extender:Stone III, Danny Lawless, Randon Goldsmith in Treatment: [phone] Encounter Discharge Information Items Discharge Condition: Stable Ambulatory Status: Wheelchair Discharge Destination: Home Transportation: Private Auto Accompanied By: self Schedule Follow-up Appointment: Yes Clinical Summary of Care: Electronic Signature(s) Signed: 07/07/2019 5:06:10 PM By: Deon Pilling Entered By: Deon Pilling on 07/07/2019 12:16:15 -------------------------------------------------------------------------------- Lower Extremity Assessment Details Patient Name: Date of Service: [name], [name] 07/07/2019 10:45 AM Medical Record [id number] Patient Account Number: [id number] Date of Birth/Sex: Treating RN: 12/30/1960 (59 y.o. [name] Primary Care Asli Tokarski: [name] Other Clinician: Referring Yoshika Vensel: Treating Amad Mau/Extender:Stone III, Danny Lawless, Randon Goldsmith in Treatment: 103 Edema Assessment Assessed: [Left: No] [Right: No] Edema: [Left: No] [Right: No] Calf Left: Right: Point of Measurement: cm From Medial Instep 28 cm 22.5 cm Ankle Left: Right: Point of Measurement: cm From Medial Instep 19.6 cm 21 cm Vascular Assessment Pulses: Dorsalis Pedis Palpable: [Left:Yes] [Right:Yes] Electronic Signature(s) Signed: 07/12/2019 6:14:31 PM By: Levan Hurst RN, BSN Entered By: Levan Hurst on 07/07/2019 11:21:57 -------------------------------------------------------------------------------- Sledge Details Patient Name: Date of Service: [name], [name] 07/07/2019 10:45 AM Medical Record [id number] Patient Account Number: [id number] Date of Birth/Sex: Treating RN: Jul 04, 1960 (59 y.o. [name] Primary Care Shaniqwa Horsman: [name] Other Clinician: Referring Bradley Handyside: Treating Avett Reineck/Extender:Stone III, Danny Lawless, Randon Goldsmith in Treatment: 103 Active Inactive Pressure Nursing Diagnoses: Knowledge  deficit related to causes and risk factors for pressure ulcer development Knowledge deficit related to management of pressures ulcers Potential for impaired tissue integrity related to pressure, friction, moisture, and shear Goals: Patient will remain free from development of additional pressure ulcers Date Initiated: 07/14/2017 Date Inactivated: 12/25/2017 Target Resolution Date: 12/19/2017 Unmet Reason: new Goal Status: Unmet pressure ulcers Patient/caregiver will verbalize risk factors for pressure ulcer development Date Initiated: 07/14/2017 Date Inactivated: 03/02/2018 Target Resolution Date: 02/27/2018 Goal  BSN Previous Signature: 07/07/2019 5:39:41 PM Version By: Alla German Entered By: Mikeal Hawthorne on 02/11/ After Cleansing: No rino No Exposed Structure osed: No (Subcutaneous Tissue) Exposed: No osed: No osed: No sed:  No ed: No nda RN, BSN 2021 11:32:13 in Area: 100% in Volume: 100% zation: Large (67-100%) No g: No -------------------------------------------------------------------------------- Wound Assessment Details Patient Name: Date of Service: [name], [name] 07/07/2019 10:45 AM Medical Record [id number] Patient Account Number: [id number] Date of Birth/Sex: Treating RN: 1960-12-05 (59 y.o. [name] Primary Care Jadis Mika: [name] Other Clinician: Referring Cloyd Ragas: Treating Jaccob Czaplicki/Extender:Stone III, Danny Lawless, Randon Goldsmith in Treatment: 103 Wound Status Wound Number: 36 Primary Pressure Ulcer Etiology: Wound Location: Right Chest - Lateral Wound Healed - Epithelialized Wounding Event: Shear/Friction Status: Date Acquired: 06/14/2019 Comorbid Deep Vein Thrombosis, Colitis, Weeks Of Treatment: 2 History: Osteomyelitis, Neuropathy, Quadriplegia, Clustered Wound: No Confinement Anxiety Photos Wound Measurements Length: (cm) 0 % Reduction Width: (cm) 0 % Reduction Depth: (cm) 0 Epitheliali Area: (cm) 0 Tunneling: Volume: (cm) 0 Underminin Wound Description Classification: Category/Stage II Foul Odor Wound Margin: Flat and Intact Slough/Fib Exudate Amount: None Present Wound Bed Granulation Amount: None Present (0%) Necrotic Amount: None Present (0%) Fascia Exp Fat Layer Tendon Exp Muscle Exp Joint Expo Bone Expos After Cleansing: No rino No Exposed Structure osed: No (Subcutaneous Tissue) Exposed: No osed: No osed: No sed: No ed: No in Area: 100% in Volume: 100% zation: Large (67-100%) No g: No Electronic Signature(s) Signed: 07/08/2019 4:30:47 PM By: Mikeal Hawthorne EMT/HBOT Signed: 07/09/2019 5:30:09 PM By: Baruch Gouty RN, BSN Previous Signature: 07/07/2019 5:39:41 PM Version By: Baruch Gouty RN, BSN Entered By: Mikeal Hawthorne on 07/08/2019  11:33:18 -------------------------------------------------------------------------------- Wound Assessment Details Patient Name: Date of Service: [name], [name] 07/07/2019 10:45 AM Medical Record [id number] Patient Account Number: [id number] Date of Birth/Sex: Treating RN: [birth year] (59 y.o. [name] Primary Care Ho Parisi: [name] Other Clinician: Referring Tyquez Hollibaugh: Treating Fynlee Rowlands/Extender:Stone III, Danny Lawless, Randon Goldsmith in Treatment: 103 Wound Status Wound Number: 37 Primary Pressure Ulcer Etiology: Wound Location: Right Foot - Lateral Wound Open Wounding Event: Pressure Injury Status: Date Acquired: 05/24/2019 Comorbid Deep Vein Thrombosis, Colitis, Weeks Of Treatment: 2 History: Osteomyelitis, Neuropathy, Quadriplegia, Clustered Wound: No Confinement Anxiety Photos Wound Measurements Length: (cm) 3.2 Width: (cm) 2.8 Depth: (cm) 0.2 Area: (cm) 7.037 Volume: (cm) 1.407 Wound Description Classification: Category/Stage III Wound Margin: Flat and Intact Exudate Amount: Medium Exudate Type: Serosanguineous Exudate Color: red, brown Wound Bed Granulation Amount: Small (1-33%) Granulation Quality: Pink Necrotic Amount: Large (67-100%) Necrotic Quality: Adherent Slough Foul Odor After Cleansing: No Slough/Fibrino Yes Exposed Structure Fascia Exposed: N Fat Layer (Subcutaneous Tissue) Exposed: Y Tendon Exposed: N Muscle Exposed: N Joint Exposed: N Bone Exposed: N % Reduction in Area: -11.3% % Reduction in Volume: -11.3% Epithelialization: None Tunneling: No Undermining: No o es o o o o Treatment Notes Wound #37 (Right, Lateral Foot) 1. Cleanse With Wound Cleanser 2. Periwound Care Moisturizing lotion 3. Primary Dressing Applied Calcium Alginate Ag Other primary dressing (specifiy in notes) 4. Secondary Dressing Dry Gauze Roll Gauze 5. Secured With Medipore tape 7. Footwear/Offloading device  applied Multipodus Splint/Boot Electronic Signature(s) Signed: 07/08/2019 4:30:47 PM By: Mikeal Hawthorne EMT/HBOT Signed: 07/09/2019 5:30:09 PM By: Baruch Gouty RN, BSN Previous Signature: 07/07/2019 5:39:41 PM Version By: Baruch Gouty RN, BSN Entered By: Mikeal Hawthorne on 07/08/2019 11:32:38 -------------------------------------------------------------------------------- Vitals Details Patient Name: Date of Service: [name], [name] 07/07/2019 10:45 AM Medical Record [id number] Patient Account Number: [id number] Date of Birth/Sex:

## 2019-07-21 ENCOUNTER — Other Ambulatory Visit: Payer: Self-pay

## 2019-07-21 ENCOUNTER — Encounter (HOSPITAL_BASED_OUTPATIENT_CLINIC_OR_DEPARTMENT_OTHER): Payer: Medicare Other | Admitting: Physician Assistant

## 2019-07-21 DIAGNOSIS — L97322 Non-pressure chronic ulcer of left 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: Secondary | ICD-10-CM | POA: Diagnosis not present

## 2019-07-21 NOTE — Progress Notes (Addendum)
[name], [name] ([id number]) Visit Report for 07/21/2019 Chief Complaint Document Details Patient Name: Date of Service: [name], [name] 07/21/2019 10:45 AM Medical Record [id number] Patient Account Number: [id number] Date of Birth/Sex: Treating RN: 10/01/1960 (59 y.o. [name] Primary Care Provider: Orpah Cobb Other Clinician: Referring Provider: Treating Provider/Extender:Stone III, Reginal Lutes, Orlie Pollen in Treatment: 105 Information Obtained from: Patient Chief Complaint Patient is here for multiple wounds on his bilateral feet and left ankle. Electronic Signature(s) Signed: 07/21/2019 11:41:21 AM By: Lenda Kelp PA-C Entered By: Lenda Kelp on 07/21/2019 11:41:20 -------------------------------------------------------------------------------- Debridement Details Patient Name: Date of Service: [name], [name] 07/21/2019 10:45 AM Medical Record [id number] Patient Account Number: [id number] Date of Birth/Sex: 05-Jun-1960 (58 y.o. M) Treating RN: Zenaida Deed Primary Care Provider: Orpah Cobb Other Clinician: Referring Provider: Treating Provider/Extender:Stone III, Reginal Lutes, Orlie Pollen in Treatment: 105 Debridement Performed for Wound #39 Right Toe Fourth Assessment: Performed By: Physician Lenda Kelp, PA Debridement Type: Debridement Level of Consciousness (Pre- Awake and Alert procedure): Pre-procedure Verification/Time Out Taken: Yes - 12:25 Start Time: 12:25 Pain Control: Other : benzocaine 20 % spray Total Area Debrided (L x W): 0.4 (cm) x 0.3 (cm) = 0.12 (cm) Tissue and other material Viable, Non-Viable, Slough, Subcutaneous, Fibrin/Exudate, Slough debrided: Level: Skin/Subcutaneous Tissue Debridement Description: Excisional Instrument: Curette Bleeding: Minimum Hemostasis Achieved: Pressure End Time: 12:28 Procedural Pain: 0 Post Procedural Pain: 0 Response to Treatment: Procedure was tolerated  well Level of Consciousness Awake and Alert (Post-procedure): Post Debridement Measurements of Total Wound Length: (cm) 0.4 Width: (cm) 0.3 Depth: (cm) 0.1 Volume: (cm) 0.009 Character of Wound/Ulcer Post Improved Debridement: Post Procedure Diagnosis Same as Pre-procedure Electronic Signature(s) Signed: 07/21/2019 5:55:18 PM By: Lenda Kelp PA-C Signed: 07/21/2019 6:08:21 PM By: Zenaida Deed RN, BSN Entered By: Zenaida Deed on 07/21/2019 12:29:16 -------------------------------------------------------------------------------- Debridement Details Patient Name: Date of Service: [name], [name] 07/21/2019 10:45 AM Medical Record [id number] Patient Account Number: [id number] Date of Birth/Sex: Jan 10, 1961 (58 y.o. M) Treating RN: Zenaida Deed Primary Care Provider: Orpah Cobb Other Clinician: Referring Provider: Treating Provider/Extender:Stone III, Reginal Lutes, Orlie Pollen in Treatment: 105 Debridement Performed for Wound #37 Right,Lateral Foot Assessment: Performed By: Physician Lenda Kelp, PA Debridement Type: Debridement Level of Consciousness (Pre- Awake and Alert procedure): Pre-procedure Verification/Time Out Taken: Yes - 12:25 Start Time: 12:25 Pain Control: Other : benzocaine 20 % spray Total Area Debrided (L x W): 3.3 (cm) x 2.4 (cm) = 7.92 (cm) Tissue and other material Viable, Non-Viable, Slough, Subcutaneous, Fibrin/Exudate, Slough debrided: Level: Skin/Subcutaneous Tissue Debridement Description: Excisional Instrument: Curette Bleeding: Minimum Hemostasis Achieved: Pressure End Time: 12:28 Procedural Pain: 0 Post Procedural Pain: 0 Response to Treatment: Procedure was tolerated well Level of Consciousness Awake and Alert (Post-procedure): Post Debridement Measurements of Total Wound Length: (cm) 3.3 Stage: Category/Stage III Width: (cm) 2.4 Depth: (cm) 0.2 Volume: (cm) 1.244 Character of Wound/Ulcer  Post Improved Debridement: Post Procedure Diagnosis Same as Pre-procedure Electronic Signature(s) Signed: 07/21/2019 5:55:18 PM By: Lenda Kelp PA-C Signed: 07/21/2019 6:08:21 PM By: Zenaida Deed RN, BSN Entered By: Zenaida Deed on 07/21/2019 12:30:11 -------------------------------------------------------------------------------- HPI Details Patient Name: Date of Service: [name], [name] 07/21/2019 10:45 AM Medical Record [id number] Patient Account Number: [id number] Date of Birth/Sex: Treating RN: 10-Mar-1961 (59 y.o. [name] Primary Care Provider: Orpah Cobb Other Clinician: Referring Provider: Treating Provider/Extender:Stone III, Reginal Lutes, Orlie Pollen in Treatment: 105 History of Present Illness HPI Description: 11/23/15; this is a patient I remember from a previous stay  Robbin Loughmiller on 07/21/2019 12:35:29 -------------------------------------------------------------------------------- SuperBill Details Patient Name: Date of Service: [name], [name] 07/21/2019 Medical Record [id number] Patient Account Number: [id number] Date of Birth/Sex: Treating RN: 10-01-1960 (59 y.o. 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 The tizanidine also had some interactions as well. Nonetheless I think at this point that it would be a possibility for [place] to use topical gentamicin to see if this could be of benefit for the patient underneath the silver alginate dressing. 07/21/2019 upon evaluation today patient appears to be doing fairly well at this point in regard to his lower extremity on the right. He does have a wound on his right lateral foot as well as the right fifth toe and the right fourth toe. Unfortunately he continues to experience significant issues with pressure and trauma to some degree and I think this is just due to the fact that again he is not really not able to control his legs and what is happening here. Fortunately there is no signs of active infection at this time. Electronic  Signature(s) Signed: 07/21/2019 12:34:12 PM By: Lenda Kelp PA-C Entered By: Lenda Kelp on 07/21/2019 12:34:12 -------------------------------------------------------------------------------- Physical Exam Details Patient Name: Date of Service: ALFIO, [name] 07/21/2019 10:45 AM Medical Record [id number] Patient Account Number: [id number] Date of Birth/Sex: Treating RN: 21-Sep-1960 (59 y.o. [name] Primary Care Provider: Orpah Cobb Other Clinician: Referring Provider: Treating Provider/Extender:Stone III, Reginal Lutes, Orlie Pollen in Treatment: 105 Constitutional Well-nourished and well-hydrated in no acute distress. Respiratory normal breathing without difficulty. Psychiatric this patient is able to make decisions and demonstrates good insight into disease process. Alert and Oriented x 3. pleasant and cooperative. Notes Upon inspection patient's wounds did require some sharp debridement clear away necrotic tissue from the surface of the wounds. Fortunately I was able to do this today without complication on the fourth and fifth toes as well as the lateral foot. He did not have any significant pain in fact that then he told me "that was not that bad". Electronic Signature(s) Signed: 07/21/2019 12:34:35 PM By: Lenda Kelp PA-C Entered By: Lenda Kelp on 07/21/2019 12:34:34 -------------------------------------------------------------------------------- Physician Orders Details Patient Name: Date of Service: HEITH, HAIGLER 07/21/2019 10:45 AM Medical Record [id number] Patient Account Number: [id number] Date of Birth/Sex: Treating RN: February 01, 1961 (58 y.o. 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 Dressing Change Frequency Wound #37 Right,Lateral Foot Change Dressing every other day. - or as needed Wound #38 Right Toe Fifth Change Dressing every other day. - or as needed Wound #39 Right Toe Fourth Change Dressing every other day. - or as needed Skin Barriers/Peri-Wound Care Barrier cream - to sacrum as needed Moisturizing lotion - both legs daily Wound Cleansing Wound #37 Right,Lateral Foot May shower and wash wound with soap and water. - with dressing changes Primary Wound Dressing Wound #37 Right,Lateral Foot Calcium Alginate with Silver - thin layer of gentamicin ointment on wound bed under alginate Wound #38 Right Toe Fifth Calcium Alginate with Silver - thin layer of gentamicin ointment on wound bed under alginate Wound #39 Right Toe Fourth Calcium Alginate with Silver - thin layer of gentamicin ointment on wound bed under alginate Secondary Dressing Wound #37 Right,Lateral  I avoided the Cipro before was the potential for QT prolongation which could obviously cause problems with the patient with results of interaction with respiratory wound. The tizanidine also had some interactions as well. Nonetheless I think at this point that it would be a possibility for [place] to use topical gentamicin to see if this could be of benefit for the patient underneath the silver alginate dressing. 07/21/2019 upon evaluation today patient appears to be doing fairly well at this point in regard to his lower extremity on the right. He does have a wound on his right lateral foot as well as the right fifth toe and the right fourth toe. Unfortunately he continues to experience significant issues with pressure and trauma to some degree and I think this is just due to the fact that again he is not really not able to control his legs and what is happening here. Fortunately there is no signs of active infection at this time. Electronic  Signature(s) Signed: 07/21/2019 12:34:12 PM By: Lenda Kelp PA-C Entered By: Lenda Kelp on 07/21/2019 12:34:12 -------------------------------------------------------------------------------- Physical Exam Details Patient Name: Date of Service: ALFIO, [name] 07/21/2019 10:45 AM Medical Record [id number] Patient Account Number: [id number] Date of Birth/Sex: Treating RN: 21-Sep-1960 (59 y.o. [name] Primary Care Provider: Orpah Cobb Other Clinician: Referring Provider: Treating Provider/Extender:Stone III, Reginal Lutes, Orlie Pollen in Treatment: 105 Constitutional Well-nourished and well-hydrated in no acute distress. Respiratory normal breathing without difficulty. Psychiatric this patient is able to make decisions and demonstrates good insight into disease process. Alert and Oriented x 3. pleasant and cooperative. Notes Upon inspection patient's wounds did require some sharp debridement clear away necrotic tissue from the surface of the wounds. Fortunately I was able to do this today without complication on the fourth and fifth toes as well as the lateral foot. He did not have any significant pain in fact that then he told me "that was not that bad". Electronic Signature(s) Signed: 07/21/2019 12:34:35 PM By: Lenda Kelp PA-C Entered By: Lenda Kelp on 07/21/2019 12:34:34 -------------------------------------------------------------------------------- Physician Orders Details Patient Name: Date of Service: HEITH, HAIGLER 07/21/2019 10:45 AM Medical Record [id number] Patient Account Number: [id number] Date of Birth/Sex: Treating RN: February 01, 1961 (58 y.o. 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 Dressing Change Frequency Wound #37 Right,Lateral Foot Change Dressing every other day. - or as needed Wound #38 Right Toe Fifth Change Dressing every other day. - or as needed Wound #39 Right Toe Fourth Change Dressing every other day. - or as needed Skin Barriers/Peri-Wound Care Barrier cream - to sacrum as needed Moisturizing lotion - both legs daily Wound Cleansing Wound #37 Right,Lateral Foot May shower and wash wound with soap and water. - with dressing changes Primary Wound Dressing Wound #37 Right,Lateral Foot Calcium Alginate with Silver - thin layer of gentamicin ointment on wound bed under alginate Wound #38 Right Toe Fifth Calcium Alginate with Silver - thin layer of gentamicin ointment on wound bed under alginate Wound #39 Right Toe Fourth Calcium Alginate with Silver - thin layer of gentamicin ointment on wound bed under alginate Secondary Dressing Wound #37 Right,Lateral  I avoided the Cipro before was the potential for QT prolongation which could obviously cause problems with the patient with results of interaction with respiratory wound. The tizanidine also had some interactions as well. Nonetheless I think at this point that it would be a possibility for [place] to use topical gentamicin to see if this could be of benefit for the patient underneath the silver alginate dressing. 07/21/2019 upon evaluation today patient appears to be doing fairly well at this point in regard to his lower extremity on the right. He does have a wound on his right lateral foot as well as the right fifth toe and the right fourth toe. Unfortunately he continues to experience significant issues with pressure and trauma to some degree and I think this is just due to the fact that again he is not really not able to control his legs and what is happening here. Fortunately there is no signs of active infection at this time. Electronic  Signature(s) Signed: 07/21/2019 12:34:12 PM By: Lenda Kelp PA-C Entered By: Lenda Kelp on 07/21/2019 12:34:12 -------------------------------------------------------------------------------- Physical Exam Details Patient Name: Date of Service: ALFIO, [name] 07/21/2019 10:45 AM Medical Record [id number] Patient Account Number: [id number] Date of Birth/Sex: Treating RN: 21-Sep-1960 (59 y.o. [name] Primary Care Provider: Orpah Cobb Other Clinician: Referring Provider: Treating Provider/Extender:Stone III, Reginal Lutes, Orlie Pollen in Treatment: 105 Constitutional Well-nourished and well-hydrated in no acute distress. Respiratory normal breathing without difficulty. Psychiatric this patient is able to make decisions and demonstrates good insight into disease process. Alert and Oriented x 3. pleasant and cooperative. Notes Upon inspection patient's wounds did require some sharp debridement clear away necrotic tissue from the surface of the wounds. Fortunately I was able to do this today without complication on the fourth and fifth toes as well as the lateral foot. He did not have any significant pain in fact that then he told me "that was not that bad". Electronic Signature(s) Signed: 07/21/2019 12:34:35 PM By: Lenda Kelp PA-C Entered By: Lenda Kelp on 07/21/2019 12:34:34 -------------------------------------------------------------------------------- Physician Orders Details Patient Name: Date of Service: HEITH, HAIGLER 07/21/2019 10:45 AM Medical Record [id number] Patient Account Number: [id number] Date of Birth/Sex: Treating RN: February 01, 1961 (58 y.o. 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 Dressing Change Frequency Wound #37 Right,Lateral Foot Change Dressing every other day. - or as needed Wound #38 Right Toe Fifth Change Dressing every other day. - or as needed Wound #39 Right Toe Fourth Change Dressing every other day. - or as needed Skin Barriers/Peri-Wound Care Barrier cream - to sacrum as needed Moisturizing lotion - both legs daily Wound Cleansing Wound #37 Right,Lateral Foot May shower and wash wound with soap and water. - with dressing changes Primary Wound Dressing Wound #37 Right,Lateral Foot Calcium Alginate with Silver - thin layer of gentamicin ointment on wound bed under alginate Wound #38 Right Toe Fifth Calcium Alginate with Silver - thin layer of gentamicin ointment on wound bed under alginate Wound #39 Right Toe Fourth Calcium Alginate with Silver - thin layer of gentamicin ointment on wound bed under alginate Secondary Dressing Wound #37 Right,Lateral  Robbin Loughmiller on 07/21/2019 12:35:29 -------------------------------------------------------------------------------- SuperBill Details Patient Name: Date of Service: [name], [name] 07/21/2019 Medical Record [id number] Patient Account Number: [id number] Date of Birth/Sex: Treating RN: 10-01-1960 (59 y.o. 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, stage 3 Modifier: Quantity: 1 Physician Procedures CPT4 Code: [id number] Description: 11042 - WC PHYS SUBQ TISS 20 SQ CM ICD-10 Diagnosis Description L89.893 Pressure ulcer of other site, stage 3 Modifier: Quantity: 1 Electronic Signature(s) Signed: 07/21/2019 12:35:36 PM By: Lenda Kelp PA-C Entered By: Lenda Kelp on 07/21/2019 12:35:36  Robbin Loughmiller on 07/21/2019 12:35:29 -------------------------------------------------------------------------------- SuperBill Details Patient Name: Date of Service: [name], [name] 07/21/2019 Medical Record [id number] Patient Account Number: [id number] Date of Birth/Sex: Treating RN: 10-01-1960 (59 y.o. 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, stage 3 Modifier: Quantity: 1 Physician Procedures CPT4 Code: 2536644 Description: 11042 - WC PHYS SUBQ TISS 20 SQ CM ICD-10 Diagnosis Description 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 [name] Primary Care Provider: Orpah Cobb Other Clinician: Referring Provider: Treating Provider/Extender:Stone III, Reginal Lutes, Orlie Pollen in Treatment: 105 Information Obtained from: Patient Chief Complaint Patient is here for multiple wounds on his bilateral feet and left ankle. Electronic Signature(s) Signed: 07/21/2019 11:41:21 AM By: Lenda Kelp PA-C Entered By: Lenda Kelp on 07/21/2019 11:41:20 -------------------------------------------------------------------------------- Debridement Details Patient Name: Date of Service: [name], [name] 07/21/2019 10:45 AM Medical Record [id number] Patient Account Number: [id number] Date of Birth/Sex: 05-Jun-1960 (58 y.o. M) Treating RN: Zenaida Deed Primary Care Provider: Orpah Cobb Other Clinician: Referring Provider: Treating Provider/Extender:Stone III, Reginal Lutes, Orlie Pollen in Treatment: 105 Debridement Performed for Wound #39 Right Toe Fourth Assessment: Performed By: Physician Lenda Kelp, PA Debridement Type: Debridement Level of Consciousness (Pre- Awake and Alert procedure): Pre-procedure Verification/Time Out Taken: Yes - 12:25 Start Time: 12:25 Pain Control: Other : benzocaine 20 % spray Total Area Debrided (L x W): 0.4 (cm) x 0.3 (cm) = 0.12 (cm) Tissue and other material Viable, Non-Viable, Slough, Subcutaneous, Fibrin/Exudate, Slough debrided: Level: Skin/Subcutaneous Tissue Debridement Description: Excisional Instrument: Curette Bleeding: Minimum Hemostasis Achieved: Pressure End Time: 12:28 Procedural Pain: 0 Post Procedural Pain: 0 Response to Treatment: Procedure was tolerated  well Level of Consciousness Awake and Alert (Post-procedure): Post Debridement Measurements of Total Wound Length: (cm) 0.4 Width: (cm) 0.3 Depth: (cm) 0.1 Volume: (cm) 0.009 Character of Wound/Ulcer Post Improved Debridement: Post Procedure Diagnosis Same as Pre-procedure Electronic Signature(s) Signed: 07/21/2019 5:55:18 PM By: Lenda Kelp PA-C Signed: 07/21/2019 6:08:21 PM By: Zenaida Deed RN, BSN Entered By: Zenaida Deed on 07/21/2019 12:29:16 -------------------------------------------------------------------------------- Debridement Details Patient Name: Date of Service: [name], [name] 07/21/2019 10:45 AM Medical Record [id number] Patient Account Number: [id number] Date of Birth/Sex: Jan 10, 1961 (58 y.o. M) Treating RN: Zenaida Deed Primary Care Provider: Orpah Cobb Other Clinician: Referring Provider: Treating Provider/Extender:Stone III, Reginal Lutes, Orlie Pollen in Treatment: 105 Debridement Performed for Wound #37 Right,Lateral Foot Assessment: Performed By: Physician Lenda Kelp, PA Debridement Type: Debridement Level of Consciousness (Pre- Awake and Alert procedure): Pre-procedure Verification/Time Out Taken: Yes - 12:25 Start Time: 12:25 Pain Control: Other : benzocaine 20 % spray Total Area Debrided (L x W): 3.3 (cm) x 2.4 (cm) = 7.92 (cm) Tissue and other material Viable, Non-Viable, Slough, Subcutaneous, Fibrin/Exudate, Slough debrided: Level: Skin/Subcutaneous Tissue Debridement Description: Excisional Instrument: Curette Bleeding: Minimum Hemostasis Achieved: Pressure End Time: 12:28 Procedural Pain: 0 Post Procedural Pain: 0 Response to Treatment: Procedure was tolerated well Level of Consciousness Awake and Alert (Post-procedure): Post Debridement Measurements of Total Wound Length: (cm) 3.3 Stage: Category/Stage III Width: (cm) 2.4 Depth: (cm) 0.2 Volume: (cm) 1.244 Character of Wound/Ulcer  Post Improved Debridement: Post Procedure Diagnosis Same as Pre-procedure Electronic Signature(s) Signed: 07/21/2019 5:55:18 PM By: Lenda Kelp PA-C Signed: 07/21/2019 6:08:21 PM By: Zenaida Deed RN, BSN Entered By: Zenaida Deed on 07/21/2019 12:30:11 -------------------------------------------------------------------------------- HPI Details Patient Name: Date of Service: [name], [name] 07/21/2019 10:45 AM Medical Record [id number] Patient Account Number: [id number] Date of Birth/Sex: Treating RN: 10-Mar-1961 (59 y.o. [name] Primary Care Provider: Orpah Cobb Other Clinician: Referring Provider: Treating Provider/Extender:Stone III, Reginal Lutes, Orlie Pollen in Treatment: 105 History of Present Illness HPI Description: 11/23/15; this is a patient I remember from a previous stay  [name], [name] ([id number]) Visit Report for 07/21/2019 Chief Complaint Document Details Patient Name: Date of Service: [name], [name] 07/21/2019 10:45 AM Medical Record [id number] Patient Account Number: [id number] Date of Birth/Sex: Treating RN: 10/01/1960 (59 y.o. [name] Primary Care Provider: Orpah Cobb Other Clinician: Referring Provider: Treating Provider/Extender:Stone III, Reginal Lutes, Orlie Pollen in Treatment: 105 Information Obtained from: Patient Chief Complaint Patient is here for multiple wounds on his bilateral feet and left ankle. Electronic Signature(s) Signed: 07/21/2019 11:41:21 AM By: Lenda Kelp PA-C Entered By: Lenda Kelp on 07/21/2019 11:41:20 -------------------------------------------------------------------------------- Debridement Details Patient Name: Date of Service: [name], [name] 07/21/2019 10:45 AM Medical Record [id number] Patient Account Number: [id number] Date of Birth/Sex: 05-Jun-1960 (58 y.o. M) Treating RN: Zenaida Deed Primary Care Provider: Orpah Cobb Other Clinician: Referring Provider: Treating Provider/Extender:Stone III, Reginal Lutes, Orlie Pollen in Treatment: 105 Debridement Performed for Wound #39 Right Toe Fourth Assessment: Performed By: Physician Lenda Kelp, PA Debridement Type: Debridement Level of Consciousness (Pre- Awake and Alert procedure): Pre-procedure Verification/Time Out Taken: Yes - 12:25 Start Time: 12:25 Pain Control: Other : benzocaine 20 % spray Total Area Debrided (L x W): 0.4 (cm) x 0.3 (cm) = 0.12 (cm) Tissue and other material Viable, Non-Viable, Slough, Subcutaneous, Fibrin/Exudate, Slough debrided: Level: Skin/Subcutaneous Tissue Debridement Description: Excisional Instrument: Curette Bleeding: Minimum Hemostasis Achieved: Pressure End Time: 12:28 Procedural Pain: 0 Post Procedural Pain: 0 Response to Treatment: Procedure was tolerated  well Level of Consciousness Awake and Alert (Post-procedure): Post Debridement Measurements of Total Wound Length: (cm) 0.4 Width: (cm) 0.3 Depth: (cm) 0.1 Volume: (cm) 0.009 Character of Wound/Ulcer Post Improved Debridement: Post Procedure Diagnosis Same as Pre-procedure Electronic Signature(s) Signed: 07/21/2019 5:55:18 PM By: Lenda Kelp PA-C Signed: 07/21/2019 6:08:21 PM By: Zenaida Deed RN, BSN Entered By: Zenaida Deed on 07/21/2019 12:29:16 -------------------------------------------------------------------------------- Debridement Details Patient Name: Date of Service: [name], [name] 07/21/2019 10:45 AM Medical Record [id number] Patient Account Number: [id number] Date of Birth/Sex: Jan 10, 1961 (58 y.o. M) Treating RN: Zenaida Deed Primary Care Provider: Orpah Cobb Other Clinician: Referring Provider: Treating Provider/Extender:Stone III, Reginal Lutes, Orlie Pollen in Treatment: 105 Debridement Performed for Wound #37 Right,Lateral Foot Assessment: Performed By: Physician Lenda Kelp, PA Debridement Type: Debridement Level of Consciousness (Pre- Awake and Alert procedure): Pre-procedure Verification/Time Out Taken: Yes - 12:25 Start Time: 12:25 Pain Control: Other : benzocaine 20 % spray Total Area Debrided (L x W): 3.3 (cm) x 2.4 (cm) = 7.92 (cm) Tissue and other material Viable, Non-Viable, Slough, Subcutaneous, Fibrin/Exudate, Slough debrided: Level: Skin/Subcutaneous Tissue Debridement Description: Excisional Instrument: Curette Bleeding: Minimum Hemostasis Achieved: Pressure End Time: 12:28 Procedural Pain: 0 Post Procedural Pain: 0 Response to Treatment: Procedure was tolerated well Level of Consciousness Awake and Alert (Post-procedure): Post Debridement Measurements of Total Wound Length: (cm) 3.3 Stage: Category/Stage III Width: (cm) 2.4 Depth: (cm) 0.2 Volume: (cm) 1.244 Character of Wound/Ulcer  Post Improved Debridement: Post Procedure Diagnosis Same as Pre-procedure Electronic Signature(s) Signed: 07/21/2019 5:55:18 PM By: Lenda Kelp PA-C Signed: 07/21/2019 6:08:21 PM By: Zenaida Deed RN, BSN Entered By: Zenaida Deed on 07/21/2019 12:30:11 -------------------------------------------------------------------------------- HPI Details Patient Name: Date of Service: [name], [name] 07/21/2019 10:45 AM Medical Record [id number] Patient Account Number: [id number] Date of Birth/Sex: Treating RN: 10-Mar-1961 (59 y.o. [name] Primary Care Provider: Orpah Cobb Other Clinician: Referring Provider: Treating Provider/Extender:Stone III, Reginal Lutes, Orlie Pollen in Treatment: 105 History of Present Illness HPI Description: 11/23/15; this is a patient I remember from a previous stay  Robbin Loughmiller on 07/21/2019 12:35:29 -------------------------------------------------------------------------------- SuperBill Details Patient Name: Date of Service: [name], [name] 07/21/2019 Medical Record [id number] Patient Account Number: [id number] Date of Birth/Sex: Treating RN: 10-01-1960 (59 y.o. 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, stage 3 Modifier: Quantity: 1 Physician Procedures CPT4 Code: 2536644 Description: 11042 - WC PHYS SUBQ TISS 20 SQ CM ICD-10 Diagnosis Description 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 [name] Primary Care Provider: Orpah Cobb Other Clinician: Referring Provider: Treating Provider/Extender:Stone III, Reginal Lutes, Orlie Pollen in Treatment: 105 Information Obtained from: Patient Chief Complaint Patient is here for multiple wounds on his bilateral feet and left ankle. Electronic Signature(s) Signed: 07/21/2019 11:41:21 AM By: Lenda Kelp PA-C Entered By: Lenda Kelp on 07/21/2019 11:41:20 -------------------------------------------------------------------------------- Debridement Details Patient Name: Date of Service: [name], [name] 07/21/2019 10:45 AM Medical Record [id number] Patient Account Number: [id number] Date of Birth/Sex: 05-Jun-1960 (58 y.o. M) Treating RN: Zenaida Deed Primary Care Provider: Orpah Cobb Other Clinician: Referring Provider: Treating Provider/Extender:Stone III, Reginal Lutes, Orlie Pollen in Treatment: 105 Debridement Performed for Wound #39 Right Toe Fourth Assessment: Performed By: Physician Lenda Kelp, PA Debridement Type: Debridement Level of Consciousness (Pre- Awake and Alert procedure): Pre-procedure Verification/Time Out Taken: Yes - 12:25 Start Time: 12:25 Pain Control: Other : benzocaine 20 % spray Total Area Debrided (L x W): 0.4 (cm) x 0.3 (cm) = 0.12 (cm) Tissue and other material Viable, Non-Viable, Slough, Subcutaneous, Fibrin/Exudate, Slough debrided: Level: Skin/Subcutaneous Tissue Debridement Description: Excisional Instrument: Curette Bleeding: Minimum Hemostasis Achieved: Pressure End Time: 12:28 Procedural Pain: 0 Post Procedural Pain: 0 Response to Treatment: Procedure was tolerated  well Level of Consciousness Awake and Alert (Post-procedure): Post Debridement Measurements of Total Wound Length: (cm) 0.4 Width: (cm) 0.3 Depth: (cm) 0.1 Volume: (cm) 0.009 Character of Wound/Ulcer Post Improved Debridement: Post Procedure Diagnosis Same as Pre-procedure Electronic Signature(s) Signed: 07/21/2019 5:55:18 PM By: Lenda Kelp PA-C Signed: 07/21/2019 6:08:21 PM By: Zenaida Deed RN, BSN Entered By: Zenaida Deed on 07/21/2019 12:29:16 -------------------------------------------------------------------------------- Debridement Details Patient Name: Date of Service: [name], [name] 07/21/2019 10:45 AM Medical Record [id number] Patient Account Number: [id number] Date of Birth/Sex: Jan 10, 1961 (58 y.o. M) Treating RN: Zenaida Deed Primary Care Provider: Orpah Cobb Other Clinician: Referring Provider: Treating Provider/Extender:Stone III, Reginal Lutes, Orlie Pollen in Treatment: 105 Debridement Performed for Wound #37 Right,Lateral Foot Assessment: Performed By: Physician Lenda Kelp, PA Debridement Type: Debridement Level of Consciousness (Pre- Awake and Alert procedure): Pre-procedure Verification/Time Out Taken: Yes - 12:25 Start Time: 12:25 Pain Control: Other : benzocaine 20 % spray Total Area Debrided (L x W): 3.3 (cm) x 2.4 (cm) = 7.92 (cm) Tissue and other material Viable, Non-Viable, Slough, Subcutaneous, Fibrin/Exudate, Slough debrided: Level: Skin/Subcutaneous Tissue Debridement Description: Excisional Instrument: Curette Bleeding: Minimum Hemostasis Achieved: Pressure End Time: 12:28 Procedural Pain: 0 Post Procedural Pain: 0 Response to Treatment: Procedure was tolerated well Level of Consciousness Awake and Alert (Post-procedure): Post Debridement Measurements of Total Wound Length: (cm) 3.3 Stage: Category/Stage III Width: (cm) 2.4 Depth: (cm) 0.2 Volume: (cm) 1.244 Character of Wound/Ulcer  Post Improved Debridement: Post Procedure Diagnosis Same as Pre-procedure Electronic Signature(s) Signed: 07/21/2019 5:55:18 PM By: Lenda Kelp PA-C Signed: 07/21/2019 6:08:21 PM By: Zenaida Deed RN, BSN Entered By: Zenaida Deed on 07/21/2019 12:30:11 -------------------------------------------------------------------------------- HPI Details Patient Name: Date of Service: [name], [name] 07/21/2019 10:45 AM Medical Record [id number] Patient Account Number: [id number] Date of Birth/Sex: Treating RN: 10-Mar-1961 (59 y.o. [name] Primary Care Provider: Orpah Cobb Other Clinician: Referring Provider: Treating Provider/Extender:Stone III, Reginal Lutes, Orlie Pollen in Treatment: 105 History of Present Illness HPI Description: 11/23/15; this is a patient I remember from a previous stay  Robbin Loughmiller on 07/21/2019 12:35:29 -------------------------------------------------------------------------------- SuperBill Details Patient Name: Date of Service: [name], [name] 07/21/2019 Medical Record [id number] Patient Account Number: [id number] Date of Birth/Sex: Treating RN: 10-01-1960 (59 y.o. 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, stage 3 Modifier: Quantity: 1 Physician Procedures CPT4 Code: 2536644 Description: 11042 - WC PHYS SUBQ TISS 20 SQ CM ICD-10 Diagnosis Description 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 [name] Primary Care Provider: Orpah Cobb Other Clinician: Referring Provider: Treating Provider/Extender:Stone III, Reginal Lutes, Orlie Pollen in Treatment: 105 Information Obtained from: Patient Chief Complaint Patient is here for multiple wounds on his bilateral feet and left ankle. Electronic Signature(s) Signed: 07/21/2019 11:41:21 AM By: Lenda Kelp PA-C Entered By: Lenda Kelp on 07/21/2019 11:41:20 -------------------------------------------------------------------------------- Debridement Details Patient Name: Date of Service: [name], [name] 07/21/2019 10:45 AM Medical Record [id number] Patient Account Number: [id number] Date of Birth/Sex: 05-Jun-1960 (58 y.o. M) Treating RN: Zenaida Deed Primary Care Provider: Orpah Cobb Other Clinician: Referring Provider: Treating Provider/Extender:Stone III, Reginal Lutes, Orlie Pollen in Treatment: 105 Debridement Performed for Wound #39 Right Toe Fourth Assessment: Performed By: Physician Lenda Kelp, PA Debridement Type: Debridement Level of Consciousness (Pre- Awake and Alert procedure): Pre-procedure Verification/Time Out Taken: Yes - 12:25 Start Time: 12:25 Pain Control: Other : benzocaine 20 % spray Total Area Debrided (L x W): 0.4 (cm) x 0.3 (cm) = 0.12 (cm) Tissue and other material Viable, Non-Viable, Slough, Subcutaneous, Fibrin/Exudate, Slough debrided: Level: Skin/Subcutaneous Tissue Debridement Description: Excisional Instrument: Curette Bleeding: Minimum Hemostasis Achieved: Pressure End Time: 12:28 Procedural Pain: 0 Post Procedural Pain: 0 Response to Treatment: Procedure was tolerated  well Level of Consciousness Awake and Alert (Post-procedure): Post Debridement Measurements of Total Wound Length: (cm) 0.4 Width: (cm) 0.3 Depth: (cm) 0.1 Volume: (cm) 0.009 Character of Wound/Ulcer Post Improved Debridement: Post Procedure Diagnosis Same as Pre-procedure Electronic Signature(s) Signed: 07/21/2019 5:55:18 PM By: Lenda Kelp PA-C Signed: 07/21/2019 6:08:21 PM By: Zenaida Deed RN, BSN Entered By: Zenaida Deed on 07/21/2019 12:29:16 -------------------------------------------------------------------------------- Debridement Details Patient Name: Date of Service: [name], [name] 07/21/2019 10:45 AM Medical Record [id number] Patient Account Number: [id number] Date of Birth/Sex: Jan 10, 1961 (58 y.o. M) Treating RN: Zenaida Deed Primary Care Provider: Orpah Cobb Other Clinician: Referring Provider: Treating Provider/Extender:Stone III, Reginal Lutes, Orlie Pollen in Treatment: 105 Debridement Performed for Wound #37 Right,Lateral Foot Assessment: Performed By: Physician Lenda Kelp, PA Debridement Type: Debridement Level of Consciousness (Pre- Awake and Alert procedure): Pre-procedure Verification/Time Out Taken: Yes - 12:25 Start Time: 12:25 Pain Control: Other : benzocaine 20 % spray Total Area Debrided (L x W): 3.3 (cm) x 2.4 (cm) = 7.92 (cm) Tissue and other material Viable, Non-Viable, Slough, Subcutaneous, Fibrin/Exudate, Slough debrided: Level: Skin/Subcutaneous Tissue Debridement Description: Excisional Instrument: Curette Bleeding: Minimum Hemostasis Achieved: Pressure End Time: 12:28 Procedural Pain: 0 Post Procedural Pain: 0 Response to Treatment: Procedure was tolerated well Level of Consciousness Awake and Alert (Post-procedure): Post Debridement Measurements of Total Wound Length: (cm) 3.3 Stage: Category/Stage III Width: (cm) 2.4 Depth: (cm) 0.2 Volume: (cm) 1.244 Character of Wound/Ulcer  Post Improved Debridement: Post Procedure Diagnosis Same as Pre-procedure Electronic Signature(s) Signed: 07/21/2019 5:55:18 PM By: Lenda Kelp PA-C Signed: 07/21/2019 6:08:21 PM By: Zenaida Deed RN, BSN Entered By: Zenaida Deed on 07/21/2019 12:30:11 -------------------------------------------------------------------------------- HPI Details Patient Name: Date of Service: [name], [name] 07/21/2019 10:45 AM Medical Record [id number] Patient Account Number: [id number] Date of Birth/Sex: Treating RN: 10-Mar-1961 (59 y.o. [name] Primary Care Provider: Orpah Cobb Other Clinician: Referring Provider: Treating Provider/Extender:Stone III, Reginal Lutes, Orlie Pollen in Treatment: 105 History of Present Illness HPI Description: 11/23/15; this is a patient I remember from a previous stay  [name], [name] ([id number]) Visit Report for 07/21/2019 Chief Complaint Document Details Patient Name: Date of Service: [name], [name] 07/21/2019 10:45 AM Medical Record [id number] Patient Account Number: [id number] Date of Birth/Sex: Treating RN: 10/01/1960 (59 y.o. [name] Primary Care Provider: Orpah Cobb Other Clinician: Referring Provider: Treating Provider/Extender:Stone III, Reginal Lutes, Orlie Pollen in Treatment: 105 Information Obtained from: Patient Chief Complaint Patient is here for multiple wounds on his bilateral feet and left ankle. Electronic Signature(s) Signed: 07/21/2019 11:41:21 AM By: Lenda Kelp PA-C Entered By: Lenda Kelp on 07/21/2019 11:41:20 -------------------------------------------------------------------------------- Debridement Details Patient Name: Date of Service: [name], [name] 07/21/2019 10:45 AM Medical Record [id number] Patient Account Number: [id number] Date of Birth/Sex: 05-Jun-1960 (58 y.o. M) Treating RN: Zenaida Deed Primary Care Provider: Orpah Cobb Other Clinician: Referring Provider: Treating Provider/Extender:Stone III, Reginal Lutes, Orlie Pollen in Treatment: 105 Debridement Performed for Wound #39 Right Toe Fourth Assessment: Performed By: Physician Lenda Kelp, PA Debridement Type: Debridement Level of Consciousness (Pre- Awake and Alert procedure): Pre-procedure Verification/Time Out Taken: Yes - 12:25 Start Time: 12:25 Pain Control: Other : benzocaine 20 % spray Total Area Debrided (L x W): 0.4 (cm) x 0.3 (cm) = 0.12 (cm) Tissue and other material Viable, Non-Viable, Slough, Subcutaneous, Fibrin/Exudate, Slough debrided: Level: Skin/Subcutaneous Tissue Debridement Description: Excisional Instrument: Curette Bleeding: Minimum Hemostasis Achieved: Pressure End Time: 12:28 Procedural Pain: 0 Post Procedural Pain: 0 Response to Treatment: Procedure was tolerated  well Level of Consciousness Awake and Alert (Post-procedure): Post Debridement Measurements of Total Wound Length: (cm) 0.4 Width: (cm) 0.3 Depth: (cm) 0.1 Volume: (cm) 0.009 Character of Wound/Ulcer Post Improved Debridement: Post Procedure Diagnosis Same as Pre-procedure Electronic Signature(s) Signed: 07/21/2019 5:55:18 PM By: Lenda Kelp PA-C Signed: 07/21/2019 6:08:21 PM By: Zenaida Deed RN, BSN Entered By: Zenaida Deed on 07/21/2019 12:29:16 -------------------------------------------------------------------------------- Debridement Details Patient Name: Date of Service: [name], [name] 07/21/2019 10:45 AM Medical Record [id number] Patient Account Number: [id number] Date of Birth/Sex: Jan 10, 1961 (58 y.o. M) Treating RN: Zenaida Deed Primary Care Provider: Orpah Cobb Other Clinician: Referring Provider: Treating Provider/Extender:Stone III, Reginal Lutes, Orlie Pollen in Treatment: 105 Debridement Performed for Wound #37 Right,Lateral Foot Assessment: Performed By: Physician Lenda Kelp, PA Debridement Type: Debridement Level of Consciousness (Pre- Awake and Alert procedure): Pre-procedure Verification/Time Out Taken: Yes - 12:25 Start Time: 12:25 Pain Control: Other : benzocaine 20 % spray Total Area Debrided (L x W): 3.3 (cm) x 2.4 (cm) = 7.92 (cm) Tissue and other material Viable, Non-Viable, Slough, Subcutaneous, Fibrin/Exudate, Slough debrided: Level: Skin/Subcutaneous Tissue Debridement Description: Excisional Instrument: Curette Bleeding: Minimum Hemostasis Achieved: Pressure End Time: 12:28 Procedural Pain: 0 Post Procedural Pain: 0 Response to Treatment: Procedure was tolerated well Level of Consciousness Awake and Alert (Post-procedure): Post Debridement Measurements of Total Wound Length: (cm) 3.3 Stage: Category/Stage III Width: (cm) 2.4 Depth: (cm) 0.2 Volume: (cm) 1.244 Character of Wound/Ulcer  Post Improved Debridement: Post Procedure Diagnosis Same as Pre-procedure Electronic Signature(s) Signed: 07/21/2019 5:55:18 PM By: Lenda Kelp PA-C Signed: 07/21/2019 6:08:21 PM By: Zenaida Deed RN, BSN Entered By: Zenaida Deed on 07/21/2019 12:30:11 -------------------------------------------------------------------------------- HPI Details Patient Name: Date of Service: [name], [name] 07/21/2019 10:45 AM Medical Record [id number] Patient Account Number: [id number] Date of Birth/Sex: Treating RN: 10-Mar-1961 (59 y.o. [name] Primary Care Provider: Orpah Cobb Other Clinician: Referring Provider: Treating Provider/Extender:Stone III, Reginal Lutes, Orlie Pollen in Treatment: 105 History of Present Illness HPI Description: 11/23/15; this is a patient I remember from a previous stay  [name], [name] ([id number]) Visit Report for 07/21/2019 Chief Complaint Document Details Patient Name: Date of Service: [name], [name] 07/21/2019 10:45 AM Medical Record [id number] Patient Account Number: [id number] Date of Birth/Sex: Treating RN: 10/01/1960 (59 y.o. [name] Primary Care Provider: Orpah Cobb Other Clinician: Referring Provider: Treating Provider/Extender:Stone III, Reginal Lutes, Orlie Pollen in Treatment: 105 Information Obtained from: Patient Chief Complaint Patient is here for multiple wounds on his bilateral feet and left ankle. Electronic Signature(s) Signed: 07/21/2019 11:41:21 AM By: Lenda Kelp PA-C Entered By: Lenda Kelp on 07/21/2019 11:41:20 -------------------------------------------------------------------------------- Debridement Details Patient Name: Date of Service: [name], [name] 07/21/2019 10:45 AM Medical Record [id number] Patient Account Number: [id number] Date of Birth/Sex: 05-Jun-1960 (58 y.o. M) Treating RN: Zenaida Deed Primary Care Provider: Orpah Cobb Other Clinician: Referring Provider: Treating Provider/Extender:Stone III, Reginal Lutes, Orlie Pollen in Treatment: 105 Debridement Performed for Wound #39 Right Toe Fourth Assessment: Performed By: Physician Lenda Kelp, PA Debridement Type: Debridement Level of Consciousness (Pre- Awake and Alert procedure): Pre-procedure Verification/Time Out Taken: Yes - 12:25 Start Time: 12:25 Pain Control: Other : benzocaine 20 % spray Total Area Debrided (L x W): 0.4 (cm) x 0.3 (cm) = 0.12 (cm) Tissue and other material Viable, Non-Viable, Slough, Subcutaneous, Fibrin/Exudate, Slough debrided: Level: Skin/Subcutaneous Tissue Debridement Description: Excisional Instrument: Curette Bleeding: Minimum Hemostasis Achieved: Pressure End Time: 12:28 Procedural Pain: 0 Post Procedural Pain: 0 Response to Treatment: Procedure was tolerated  well Level of Consciousness Awake and Alert (Post-procedure): Post Debridement Measurements of Total Wound Length: (cm) 0.4 Width: (cm) 0.3 Depth: (cm) 0.1 Volume: (cm) 0.009 Character of Wound/Ulcer Post Improved Debridement: Post Procedure Diagnosis Same as Pre-procedure Electronic Signature(s) Signed: 07/21/2019 5:55:18 PM By: Lenda Kelp PA-C Signed: 07/21/2019 6:08:21 PM By: Zenaida Deed RN, BSN Entered By: Zenaida Deed on 07/21/2019 12:29:16 -------------------------------------------------------------------------------- Debridement Details Patient Name: Date of Service: [name], [name] 07/21/2019 10:45 AM Medical Record [id number] Patient Account Number: [id number] Date of Birth/Sex: Jan 10, 1961 (58 y.o. M) Treating RN: Zenaida Deed Primary Care Provider: Orpah Cobb Other Clinician: Referring Provider: Treating Provider/Extender:Stone III, Reginal Lutes, Orlie Pollen in Treatment: 105 Debridement Performed for Wound #37 Right,Lateral Foot Assessment: Performed By: Physician Lenda Kelp, PA Debridement Type: Debridement Level of Consciousness (Pre- Awake and Alert procedure): Pre-procedure Verification/Time Out Taken: Yes - 12:25 Start Time: 12:25 Pain Control: Other : benzocaine 20 % spray Total Area Debrided (L x W): 3.3 (cm) x 2.4 (cm) = 7.92 (cm) Tissue and other material Viable, Non-Viable, Slough, Subcutaneous, Fibrin/Exudate, Slough debrided: Level: Skin/Subcutaneous Tissue Debridement Description: Excisional Instrument: Curette Bleeding: Minimum Hemostasis Achieved: Pressure End Time: 12:28 Procedural Pain: 0 Post Procedural Pain: 0 Response to Treatment: Procedure was tolerated well Level of Consciousness Awake and Alert (Post-procedure): Post Debridement Measurements of Total Wound Length: (cm) 3.3 Stage: Category/Stage III Width: (cm) 2.4 Depth: (cm) 0.2 Volume: (cm) 1.244 Character of Wound/Ulcer  Post Improved Debridement: Post Procedure Diagnosis Same as Pre-procedure Electronic Signature(s) Signed: 07/21/2019 5:55:18 PM By: Lenda Kelp PA-C Signed: 07/21/2019 6:08:21 PM By: Zenaida Deed RN, BSN Entered By: Zenaida Deed on 07/21/2019 12:30:11 -------------------------------------------------------------------------------- HPI Details Patient Name: Date of Service: [name], [name] 07/21/2019 10:45 AM Medical Record [id number] Patient Account Number: [id number] Date of Birth/Sex: Treating RN: 10-Mar-1961 (59 y.o. [name] Primary Care Provider: Orpah Cobb Other Clinician: Referring Provider: Treating Provider/Extender:Stone III, Reginal Lutes, Orlie Pollen in Treatment: 105 History of Present Illness HPI Description: 11/23/15; this is a patient I remember from a previous stay  I avoided the Cipro before was the potential for QT prolongation which could obviously cause problems with the patient with results of interaction with respiratory wound. The tizanidine also had some interactions as well. Nonetheless I think at this point that it would be a possibility for [place] to use topical gentamicin to see if this could be of benefit for the patient underneath the silver alginate dressing. 07/21/2019 upon evaluation today patient appears to be doing fairly well at this point in regard to his lower extremity on the right. He does have a wound on his right lateral foot as well as the right fifth toe and the right fourth toe. Unfortunately he continues to experience significant issues with pressure and trauma to some degree and I think this is just due to the fact that again he is not really not able to control his legs and what is happening here. Fortunately there is no signs of active infection at this time. Electronic  Signature(s) Signed: 07/21/2019 12:34:12 PM By: Lenda Kelp PA-C Entered By: Lenda Kelp on 07/21/2019 12:34:12 -------------------------------------------------------------------------------- Physical Exam Details Patient Name: Date of Service: ALFIO, [name] 07/21/2019 10:45 AM Medical Record [id number] Patient Account Number: [id number] Date of Birth/Sex: Treating RN: 21-Sep-1960 (59 y.o. [name] Primary Care Provider: Orpah Cobb Other Clinician: Referring Provider: Treating Provider/Extender:Stone III, Reginal Lutes, Orlie Pollen in Treatment: 105 Constitutional Well-nourished and well-hydrated in no acute distress. Respiratory normal breathing without difficulty. Psychiatric this patient is able to make decisions and demonstrates good insight into disease process. Alert and Oriented x 3. pleasant and cooperative. Notes Upon inspection patient's wounds did require some sharp debridement clear away necrotic tissue from the surface of the wounds. Fortunately I was able to do this today without complication on the fourth and fifth toes as well as the lateral foot. He did not have any significant pain in fact that then he told me "that was not that bad". Electronic Signature(s) Signed: 07/21/2019 12:34:35 PM By: Lenda Kelp PA-C Entered By: Lenda Kelp on 07/21/2019 12:34:34 -------------------------------------------------------------------------------- Physician Orders Details Patient Name: Date of Service: HEITH, HAIGLER 07/21/2019 10:45 AM Medical Record [id number] Patient Account Number: [id number] Date of Birth/Sex: Treating RN: February 01, 1961 (58 y.o. 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 Dressing Change Frequency Wound #37 Right,Lateral Foot Change Dressing every other day. - or as needed Wound #38 Right Toe Fifth Change Dressing every other day. - or as needed Wound #39 Right Toe Fourth Change Dressing every other day. - or as needed Skin Barriers/Peri-Wound Care Barrier cream - to sacrum as needed Moisturizing lotion - both legs daily Wound Cleansing Wound #37 Right,Lateral Foot May shower and wash wound with soap and water. - with dressing changes Primary Wound Dressing Wound #37 Right,Lateral Foot Calcium Alginate with Silver - thin layer of gentamicin ointment on wound bed under alginate Wound #38 Right Toe Fifth Calcium Alginate with Silver - thin layer of gentamicin ointment on wound bed under alginate Wound #39 Right Toe Fourth Calcium Alginate with Silver - thin layer of gentamicin ointment on wound bed under alginate Secondary Dressing Wound #37 Right,Lateral  [name], [name] ([id number]) Visit Report for 07/21/2019 Chief Complaint Document Details Patient Name: Date of Service: [name], [name] 07/21/2019 10:45 AM Medical Record [id number] Patient Account Number: [id number] Date of Birth/Sex: Treating RN: 10/01/1960 (59 y.o. [name] Primary Care Provider: Orpah Cobb Other Clinician: Referring Provider: Treating Provider/Extender:Stone III, Reginal Lutes, Orlie Pollen in Treatment: 105 Information Obtained from: Patient Chief Complaint Patient is here for multiple wounds on his bilateral feet and left ankle. Electronic Signature(s) Signed: 07/21/2019 11:41:21 AM By: Lenda Kelp PA-C Entered By: Lenda Kelp on 07/21/2019 11:41:20 -------------------------------------------------------------------------------- Debridement Details Patient Name: Date of Service: [name], [name] 07/21/2019 10:45 AM Medical Record [id number] Patient Account Number: [id number] Date of Birth/Sex: 05-Jun-1960 (58 y.o. M) Treating RN: Zenaida Deed Primary Care Provider: Orpah Cobb Other Clinician: Referring Provider: Treating Provider/Extender:Stone III, Reginal Lutes, Orlie Pollen in Treatment: 105 Debridement Performed for Wound #39 Right Toe Fourth Assessment: Performed By: Physician Lenda Kelp, PA Debridement Type: Debridement Level of Consciousness (Pre- Awake and Alert procedure): Pre-procedure Verification/Time Out Taken: Yes - 12:25 Start Time: 12:25 Pain Control: Other : benzocaine 20 % spray Total Area Debrided (L x W): 0.4 (cm) x 0.3 (cm) = 0.12 (cm) Tissue and other material Viable, Non-Viable, Slough, Subcutaneous, Fibrin/Exudate, Slough debrided: Level: Skin/Subcutaneous Tissue Debridement Description: Excisional Instrument: Curette Bleeding: Minimum Hemostasis Achieved: Pressure End Time: 12:28 Procedural Pain: 0 Post Procedural Pain: 0 Response to Treatment: Procedure was tolerated  well Level of Consciousness Awake and Alert (Post-procedure): Post Debridement Measurements of Total Wound Length: (cm) 0.4 Width: (cm) 0.3 Depth: (cm) 0.1 Volume: (cm) 0.009 Character of Wound/Ulcer Post Improved Debridement: Post Procedure Diagnosis Same as Pre-procedure Electronic Signature(s) Signed: 07/21/2019 5:55:18 PM By: Lenda Kelp PA-C Signed: 07/21/2019 6:08:21 PM By: Zenaida Deed RN, BSN Entered By: Zenaida Deed on 07/21/2019 12:29:16 -------------------------------------------------------------------------------- Debridement Details Patient Name: Date of Service: [name], [name] 07/21/2019 10:45 AM Medical Record [id number] Patient Account Number: [id number] Date of Birth/Sex: Jan 10, 1961 (58 y.o. M) Treating RN: Zenaida Deed Primary Care Provider: Orpah Cobb Other Clinician: Referring Provider: Treating Provider/Extender:Stone III, Reginal Lutes, Orlie Pollen in Treatment: 105 Debridement Performed for Wound #37 Right,Lateral Foot Assessment: Performed By: Physician Lenda Kelp, PA Debridement Type: Debridement Level of Consciousness (Pre- Awake and Alert procedure): Pre-procedure Verification/Time Out Taken: Yes - 12:25 Start Time: 12:25 Pain Control: Other : benzocaine 20 % spray Total Area Debrided (L x W): 3.3 (cm) x 2.4 (cm) = 7.92 (cm) Tissue and other material Viable, Non-Viable, Slough, Subcutaneous, Fibrin/Exudate, Slough debrided: Level: Skin/Subcutaneous Tissue Debridement Description: Excisional Instrument: Curette Bleeding: Minimum Hemostasis Achieved: Pressure End Time: 12:28 Procedural Pain: 0 Post Procedural Pain: 0 Response to Treatment: Procedure was tolerated well Level of Consciousness Awake and Alert (Post-procedure): Post Debridement Measurements of Total Wound Length: (cm) 3.3 Stage: Category/Stage III Width: (cm) 2.4 Depth: (cm) 0.2 Volume: (cm) 1.244 Character of Wound/Ulcer  Post Improved Debridement: Post Procedure Diagnosis Same as Pre-procedure Electronic Signature(s) Signed: 07/21/2019 5:55:18 PM By: Lenda Kelp PA-C Signed: 07/21/2019 6:08:21 PM By: Zenaida Deed RN, BSN Entered By: Zenaida Deed on 07/21/2019 12:30:11 -------------------------------------------------------------------------------- HPI Details Patient Name: Date of Service: [name], [name] 07/21/2019 10:45 AM Medical Record [id number] Patient Account Number: [id number] Date of Birth/Sex: Treating RN: 10-Mar-1961 (59 y.o. [name] Primary Care Provider: Orpah Cobb Other Clinician: Referring Provider: Treating Provider/Extender:Stone III, Reginal Lutes, Orlie Pollen in Treatment: 105 History of Present Illness HPI Description: 11/23/15; this is a patient I remember from a previous stay  [name], [name] ([id number]) Visit Report for 07/21/2019 Chief Complaint Document Details Patient Name: Date of Service: [name], [name] 07/21/2019 10:45 AM Medical Record [id number] Patient Account Number: [id number] Date of Birth/Sex: Treating RN: 10/01/1960 (59 y.o. [name] Primary Care Provider: Orpah Cobb Other Clinician: Referring Provider: Treating Provider/Extender:Stone III, Reginal Lutes, Orlie Pollen in Treatment: 105 Information Obtained from: Patient Chief Complaint Patient is here for multiple wounds on his bilateral feet and left ankle. Electronic Signature(s) Signed: 07/21/2019 11:41:21 AM By: Lenda Kelp PA-C Entered By: Lenda Kelp on 07/21/2019 11:41:20 -------------------------------------------------------------------------------- Debridement Details Patient Name: Date of Service: [name], [name] 07/21/2019 10:45 AM Medical Record [id number] Patient Account Number: [id number] Date of Birth/Sex: 05-Jun-1960 (58 y.o. M) Treating RN: Zenaida Deed Primary Care Provider: Orpah Cobb Other Clinician: Referring Provider: Treating Provider/Extender:Stone III, Reginal Lutes, Orlie Pollen in Treatment: 105 Debridement Performed for Wound #39 Right Toe Fourth Assessment: Performed By: Physician Lenda Kelp, PA Debridement Type: Debridement Level of Consciousness (Pre- Awake and Alert procedure): Pre-procedure Verification/Time Out Taken: Yes - 12:25 Start Time: 12:25 Pain Control: Other : benzocaine 20 % spray Total Area Debrided (L x W): 0.4 (cm) x 0.3 (cm) = 0.12 (cm) Tissue and other material Viable, Non-Viable, Slough, Subcutaneous, Fibrin/Exudate, Slough debrided: Level: Skin/Subcutaneous Tissue Debridement Description: Excisional Instrument: Curette Bleeding: Minimum Hemostasis Achieved: Pressure End Time: 12:28 Procedural Pain: 0 Post Procedural Pain: 0 Response to Treatment: Procedure was tolerated  well Level of Consciousness Awake and Alert (Post-procedure): Post Debridement Measurements of Total Wound Length: (cm) 0.4 Width: (cm) 0.3 Depth: (cm) 0.1 Volume: (cm) 0.009 Character of Wound/Ulcer Post Improved Debridement: Post Procedure Diagnosis Same as Pre-procedure Electronic Signature(s) Signed: 07/21/2019 5:55:18 PM By: Lenda Kelp PA-C Signed: 07/21/2019 6:08:21 PM By: Zenaida Deed RN, BSN Entered By: Zenaida Deed on 07/21/2019 12:29:16 -------------------------------------------------------------------------------- Debridement Details Patient Name: Date of Service: [name], [name] 07/21/2019 10:45 AM Medical Record [id number] Patient Account Number: [id number] Date of Birth/Sex: Jan 10, 1961 (58 y.o. M) Treating RN: Zenaida Deed Primary Care Provider: Orpah Cobb Other Clinician: Referring Provider: Treating Provider/Extender:Stone III, Reginal Lutes, Orlie Pollen in Treatment: 105 Debridement Performed for Wound #37 Right,Lateral Foot Assessment: Performed By: Physician Lenda Kelp, PA Debridement Type: Debridement Level of Consciousness (Pre- Awake and Alert procedure): Pre-procedure Verification/Time Out Taken: Yes - 12:25 Start Time: 12:25 Pain Control: Other : benzocaine 20 % spray Total Area Debrided (L x W): 3.3 (cm) x 2.4 (cm) = 7.92 (cm) Tissue and other material Viable, Non-Viable, Slough, Subcutaneous, Fibrin/Exudate, Slough debrided: Level: Skin/Subcutaneous Tissue Debridement Description: Excisional Instrument: Curette Bleeding: Minimum Hemostasis Achieved: Pressure End Time: 12:28 Procedural Pain: 0 Post Procedural Pain: 0 Response to Treatment: Procedure was tolerated well Level of Consciousness Awake and Alert (Post-procedure): Post Debridement Measurements of Total Wound Length: (cm) 3.3 Stage: Category/Stage III Width: (cm) 2.4 Depth: (cm) 0.2 Volume: (cm) 1.244 Character of Wound/Ulcer  Post Improved Debridement: Post Procedure Diagnosis Same as Pre-procedure Electronic Signature(s) Signed: 07/21/2019 5:55:18 PM By: Lenda Kelp PA-C Signed: 07/21/2019 6:08:21 PM By: Zenaida Deed RN, BSN Entered By: Zenaida Deed on 07/21/2019 12:30:11 -------------------------------------------------------------------------------- HPI Details Patient Name: Date of Service: [name], [name] 07/21/2019 10:45 AM Medical Record [id number] Patient Account Number: [id number] Date of Birth/Sex: Treating RN: 10-Mar-1961 (59 y.o. [name] Primary Care Provider: Orpah Cobb Other Clinician: Referring Provider: Treating Provider/Extender:Stone III, Reginal Lutes, Orlie Pollen in Treatment: 105 History of Present Illness HPI Description: 11/23/15; this is a patient I remember from a previous stay  [name], [name] ([id number]) Visit Report for 07/21/2019 Chief Complaint Document Details Patient Name: Date of Service: [name], [name] 07/21/2019 10:45 AM Medical Record [id number] Patient Account Number: [id number] Date of Birth/Sex: Treating RN: 10/01/1960 (59 y.o. [name] Primary Care Provider: Orpah Cobb Other Clinician: Referring Provider: Treating Provider/Extender:Stone III, Reginal Lutes, Orlie Pollen in Treatment: 105 Information Obtained from: Patient Chief Complaint Patient is here for multiple wounds on his bilateral feet and left ankle. Electronic Signature(s) Signed: 07/21/2019 11:41:21 AM By: Lenda Kelp PA-C Entered By: Lenda Kelp on 07/21/2019 11:41:20 -------------------------------------------------------------------------------- Debridement Details Patient Name: Date of Service: [name], [name] 07/21/2019 10:45 AM Medical Record [id number] Patient Account Number: [id number] Date of Birth/Sex: 05-Jun-1960 (58 y.o. M) Treating RN: Zenaida Deed Primary Care Provider: Orpah Cobb Other Clinician: Referring Provider: Treating Provider/Extender:Stone III, Reginal Lutes, Orlie Pollen in Treatment: 105 Debridement Performed for Wound #39 Right Toe Fourth Assessment: Performed By: Physician Lenda Kelp, PA Debridement Type: Debridement Level of Consciousness (Pre- Awake and Alert procedure): Pre-procedure Verification/Time Out Taken: Yes - 12:25 Start Time: 12:25 Pain Control: Other : benzocaine 20 % spray Total Area Debrided (L x W): 0.4 (cm) x 0.3 (cm) = 0.12 (cm) Tissue and other material Viable, Non-Viable, Slough, Subcutaneous, Fibrin/Exudate, Slough debrided: Level: Skin/Subcutaneous Tissue Debridement Description: Excisional Instrument: Curette Bleeding: Minimum Hemostasis Achieved: Pressure End Time: 12:28 Procedural Pain: 0 Post Procedural Pain: 0 Response to Treatment: Procedure was tolerated  well Level of Consciousness Awake and Alert (Post-procedure): Post Debridement Measurements of Total Wound Length: (cm) 0.4 Width: (cm) 0.3 Depth: (cm) 0.1 Volume: (cm) 0.009 Character of Wound/Ulcer Post Improved Debridement: Post Procedure Diagnosis Same as Pre-procedure Electronic Signature(s) Signed: 07/21/2019 5:55:18 PM By: Lenda Kelp PA-C Signed: 07/21/2019 6:08:21 PM By: Zenaida Deed RN, BSN Entered By: Zenaida Deed on 07/21/2019 12:29:16 -------------------------------------------------------------------------------- Debridement Details Patient Name: Date of Service: [name], [name] 07/21/2019 10:45 AM Medical Record [id number] Patient Account Number: [id number] Date of Birth/Sex: Jan 10, 1961 (58 y.o. M) Treating RN: Zenaida Deed Primary Care Provider: Orpah Cobb Other Clinician: Referring Provider: Treating Provider/Extender:Stone III, Reginal Lutes, Orlie Pollen in Treatment: 105 Debridement Performed for Wound #37 Right,Lateral Foot Assessment: Performed By: Physician Lenda Kelp, PA Debridement Type: Debridement Level of Consciousness (Pre- Awake and Alert procedure): Pre-procedure Verification/Time Out Taken: Yes - 12:25 Start Time: 12:25 Pain Control: Other : benzocaine 20 % spray Total Area Debrided (L x W): 3.3 (cm) x 2.4 (cm) = 7.92 (cm) Tissue and other material Viable, Non-Viable, Slough, Subcutaneous, Fibrin/Exudate, Slough debrided: Level: Skin/Subcutaneous Tissue Debridement Description: Excisional Instrument: Curette Bleeding: Minimum Hemostasis Achieved: Pressure End Time: 12:28 Procedural Pain: 0 Post Procedural Pain: 0 Response to Treatment: Procedure was tolerated well Level of Consciousness Awake and Alert (Post-procedure): Post Debridement Measurements of Total Wound Length: (cm) 3.3 Stage: Category/Stage III Width: (cm) 2.4 Depth: (cm) 0.2 Volume: (cm) 1.244 Character of Wound/Ulcer  Post Improved Debridement: Post Procedure Diagnosis Same as Pre-procedure Electronic Signature(s) Signed: 07/21/2019 5:55:18 PM By: Lenda Kelp PA-C Signed: 07/21/2019 6:08:21 PM By: Zenaida Deed RN, BSN Entered By: Zenaida Deed on 07/21/2019 12:30:11 -------------------------------------------------------------------------------- HPI Details Patient Name: Date of Service: [name], [name] 07/21/2019 10:45 AM Medical Record [id number] Patient Account Number: [id number] Date of Birth/Sex: Treating RN: 10-Mar-1961 (59 y.o. [name] Primary Care Provider: Orpah Cobb Other Clinician: Referring Provider: Treating Provider/Extender:Stone III, Reginal Lutes, Orlie Pollen in Treatment: 105 History of Present Illness HPI Description: 11/23/15; this is a patient I remember from a previous stay  Robbin Loughmiller on 07/21/2019 12:35:29 -------------------------------------------------------------------------------- SuperBill Details Patient Name: Date of Service: [name], [name] 07/21/2019 Medical Record [id number] Patient Account Number: [id number] Date of Birth/Sex: Treating RN: 10-01-1960 (59 y.o. 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 The tizanidine also had some interactions as well. Nonetheless I think at this point that it would be a possibility for [place] to use topical gentamicin to see if this could be of benefit for the patient underneath the silver alginate dressing. 07/21/2019 upon evaluation today patient appears to be doing fairly well at this point in regard to his lower extremity on the right. He does have a wound on his right lateral foot as well as the right fifth toe and the right fourth toe. Unfortunately he continues to experience significant issues with pressure and trauma to some degree and I think this is just due to the fact that again he is not really not able to control his legs and what is happening here. Fortunately there is no signs of active infection at this time. Electronic  Signature(s) Signed: 07/21/2019 12:34:12 PM By: Lenda Kelp PA-C Entered By: Lenda Kelp on 07/21/2019 12:34:12 -------------------------------------------------------------------------------- Physical Exam Details Patient Name: Date of Service: ALFIO, [name] 07/21/2019 10:45 AM Medical Record [id number] Patient Account Number: [id number] Date of Birth/Sex: Treating RN: 21-Sep-1960 (59 y.o. [name] Primary Care Provider: Orpah Cobb Other Clinician: Referring Provider: Treating Provider/Extender:Stone III, Reginal Lutes, Orlie Pollen in Treatment: 105 Constitutional Well-nourished and well-hydrated in no acute distress. Respiratory normal breathing without difficulty. Psychiatric this patient is able to make decisions and demonstrates good insight into disease process. Alert and Oriented x 3. pleasant and cooperative. Notes Upon inspection patient's wounds did require some sharp debridement clear away necrotic tissue from the surface of the wounds. Fortunately I was able to do this today without complication on the fourth and fifth toes as well as the lateral foot. He did not have any significant pain in fact that then he told me "that was not that bad". Electronic Signature(s) Signed: 07/21/2019 12:34:35 PM By: Lenda Kelp PA-C Entered By: Lenda Kelp on 07/21/2019 12:34:34 -------------------------------------------------------------------------------- Physician Orders Details Patient Name: Date of Service: HEITH, HAIGLER 07/21/2019 10:45 AM Medical Record [id number] Patient Account Number: [id number] Date of Birth/Sex: Treating RN: February 01, 1961 (58 y.o. 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 Dressing Change Frequency Wound #37 Right,Lateral Foot Change Dressing every other day. - or as needed Wound #38 Right Toe Fifth Change Dressing every other day. - or as needed Wound #39 Right Toe Fourth Change Dressing every other day. - or as needed Skin Barriers/Peri-Wound Care Barrier cream - to sacrum as needed Moisturizing lotion - both legs daily Wound Cleansing Wound #37 Right,Lateral Foot May shower and wash wound with soap and water. - with dressing changes Primary Wound Dressing Wound #37 Right,Lateral Foot Calcium Alginate with Silver - thin layer of gentamicin ointment on wound bed under alginate Wound #38 Right Toe Fifth Calcium Alginate with Silver - thin layer of gentamicin ointment on wound bed under alginate Wound #39 Right Toe Fourth Calcium Alginate with Silver - thin layer of gentamicin ointment on wound bed under alginate Secondary Dressing Wound #37 Right,Lateral  I avoided the Cipro before was the potential for QT prolongation which could obviously cause problems with the patient with results of interaction with respiratory wound. The tizanidine also had some interactions as well. Nonetheless I think at this point that it would be a possibility for [place] to use topical gentamicin to see if this could be of benefit for the patient underneath the silver alginate dressing. 07/21/2019 upon evaluation today patient appears to be doing fairly well at this point in regard to his lower extremity on the right. He does have a wound on his right lateral foot as well as the right fifth toe and the right fourth toe. Unfortunately he continues to experience significant issues with pressure and trauma to some degree and I think this is just due to the fact that again he is not really not able to control his legs and what is happening here. Fortunately there is no signs of active infection at this time. Electronic  Signature(s) Signed: 07/21/2019 12:34:12 PM By: Lenda Kelp PA-C Entered By: Lenda Kelp on 07/21/2019 12:34:12 -------------------------------------------------------------------------------- Physical Exam Details Patient Name: Date of Service: ALFIO, [name] 07/21/2019 10:45 AM Medical Record [id number] Patient Account Number: [id number] Date of Birth/Sex: Treating RN: 21-Sep-1960 (59 y.o. [name] Primary Care Provider: Orpah Cobb Other Clinician: Referring Provider: Treating Provider/Extender:Stone III, Reginal Lutes, Orlie Pollen in Treatment: 105 Constitutional Well-nourished and well-hydrated in no acute distress. Respiratory normal breathing without difficulty. Psychiatric this patient is able to make decisions and demonstrates good insight into disease process. Alert and Oriented x 3. pleasant and cooperative. Notes Upon inspection patient's wounds did require some sharp debridement clear away necrotic tissue from the surface of the wounds. Fortunately I was able to do this today without complication on the fourth and fifth toes as well as the lateral foot. He did not have any significant pain in fact that then he told me "that was not that bad". Electronic Signature(s) Signed: 07/21/2019 12:34:35 PM By: Lenda Kelp PA-C Entered By: Lenda Kelp on 07/21/2019 12:34:34 -------------------------------------------------------------------------------- Physician Orders Details Patient Name: Date of Service: HEITH, HAIGLER 07/21/2019 10:45 AM Medical Record [id number] Patient Account Number: [id number] Date of Birth/Sex: Treating RN: February 01, 1961 (58 y.o. 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 Dressing Change Frequency Wound #37 Right,Lateral Foot Change Dressing every other day. - or as needed Wound #38 Right Toe Fifth Change Dressing every other day. - or as needed Wound #39 Right Toe Fourth Change Dressing every other day. - or as needed Skin Barriers/Peri-Wound Care Barrier cream - to sacrum as needed Moisturizing lotion - both legs daily Wound Cleansing Wound #37 Right,Lateral Foot May shower and wash wound with soap and water. - with dressing changes Primary Wound Dressing Wound #37 Right,Lateral Foot Calcium Alginate with Silver - thin layer of gentamicin ointment on wound bed under alginate Wound #38 Right Toe Fifth Calcium Alginate with Silver - thin layer of gentamicin ointment on wound bed under alginate Wound #39 Right Toe Fourth Calcium Alginate with Silver - thin layer of gentamicin ointment on wound bed under alginate Secondary Dressing Wound #37 Right,Lateral

## 2019-07-22 NOTE — Progress Notes (Addendum)
ByMikeal Hawthorne on 07/23/2019 16:03:13 -------------------------------------------------------------------------------- Wound Assessment Details Patient Name: Date of  Service: [name], [name] 07/21/2019 10:45 AM Medical Record [id number] Patient Account Number: [id number] Date of Birth/Sex: Treating RN: February 05, 1961 (59 y.o. [name] Primary Care Aarya Quebedeaux: [name] Other Clinician: Referring Julianah Marciel: Treating Jadah Bobak/Extender:Stone III, Danny Lawless, Randon Goldsmith in Treatment: 105 Wound Status Wound Number: 38 Primary Pressure Ulcer Etiology: Wound Location: Right Toe Fifth Wound Open Wounding Event: Shear/Friction Status: Date Acquired: 07/21/2019 Date Acquired: 07/21/2019 Comorbid Deep Vein Thrombosis, Colitis, Weeks Of Treatment: 0 History: Osteomyelitis, Neuropathy, Quadriplegia, Clustered Wound: No Confinement Anxiety Photos Wound Measurements Length: (cm) 0.7 Width: (cm) 0.8 Depth: (cm) 0.2 Area: (cm) 0.44 Volume: (cm) 0.088 Wound Description Classification: Category/Stage II Wound Margin: Distinct, outline attached Exudate Amount: Medium Exudate Type: Serosanguineous Exudate Color: red, brown Wound Bed Granulation Amount: Large (67-100%) Granulation Quality: Red Necrotic Amount: None Present (0%) or After Cleansing: No Fibrino No Exposed Structure xposed: No r (Subcutaneous Tissue) Exposed: No xposed: No xposed: No posed: No osed: No % Reduction in Area: 0% % Reduction in Volume: 0% Epithelialization: Small (1-33%) Tunneling: No Undermining: No Foul Od Slough/ Fascia E Fat Laye Tendon E Muscle E Joint Ex Bone Exp Treatment Notes Wound #38 (Right Toe Fifth) 1. Cleanse With Wound Cleanser 3. Primary Dressing Applied Calcium Alginate Ag Other primary dressing (specifiy in notes) 4. Secondary Dressing Dry Gauze Roll Gauze 5. Secured With Medipore tape 7. Footwear/Offloading device applied Multipodus Splint/Boot Notes primary dressing bactroban ointment used instead of gentamycin. no gentamycin in clinic. Electronic Signature(s) Signed: 07/23/2019 5:01:55 PM By: Baruch Gouty RN, BSN Signed: 07/23/2019 5:12:28 PM By: Mikeal Hawthorne EMT/HBOT Previous Signature: 07/22/2019 6:07:18 PM Version By: Deon Pilling Entered By: Mikeal Hawthorne on 07/23/2019 16:03:51 -------------------------------------------------------------------------------- Wound Assessment Details Patient Name: Date of Service: [name], [name] 07/21/2019 10:45 AM Medical Record [id number] Patient Account Number: [id number] Date of Birth/Sex: Treating RN: Mar 21, 1961 (59 y.o. [name] Primary Care Jocob Dambach: [name] Other Clinician: Referring Aeson Sawyers: Treating Keia Rask/Extender:Stone III, Danny Lawless, Randon Goldsmith in Treatment: 105 Wound Status Wound Number: 39 Primary Abrasion Etiology: Wound Location: Right Toe Fourth Wound Open Wounding Event: Trauma Status: Date Acquired: 07/21/2019 Comorbid Deep Vein Thrombosis, Colitis, Weeks Of Treatment: 0 History: Osteomyelitis, Neuropathy, Quadriplegia, Clustered Wound: No Confinement Anxiety Photos Wound Measurements Length: (cm) 0.4 % Reduct Width: (cm) 0.3 % Reduct Depth: (cm) 0.1 Epitheli Area: (cm) 0.094 Tunneli Volume: (cm) 0.009 Undermi Wound Description Classification: Full Thickness Without Exposed Support Foul Odo Structures Slough/F Wound Flat and Intact Margin: Exudate None Present Amount: Wound Bed Granulation Amount: None Present (0%) Necrotic Amount: Large (67-100%) Fascia E Necrotic Quality: Eschar Fat Laye Tendon E Muscle E Joint Ex Bone Exp r After Cleansing: No ibrino No Exposed Structure xposed: No r (Subcutaneous Tissue) Exposed: Yes xposed: No xposed: No posed: No osed: No ion in Area: 0% ion in Volume: 0% alization: Small (1-33%) ng: No ning: No Treatment Notes Wound #39 (Right Toe Fourth) 1. Cleanse With Wound Cleanser 3. Primary Dressing Applied Calcium Alginate Ag Other primary dressing (specifiy in notes) 4. Secondary Dressing Dry Gauze Roll Gauze 5.  Secured With Medipore tape 7. Footwear/Offloading device applied Multipodus Splint/Boot Notes primary dressing bactroban ointment used instead of gentamycin. no gentamycin in clinic. Electronic Signature(s) Signed: 07/23/2019 5:01:55 PM By: Baruch Gouty RN, BSN Signed: 07/23/2019 5:12:28 PM By: Mikeal Hawthorne EMT/HBOT Previous Signature: 07/21/2019 6:08:21 PM Version By: Baruch Gouty RN, BSN Entered By: Mikeal Hawthorne on 07/23/2019 16:04:18 -------------------------------------------------------------------------------- Vitals  ByMikeal Hawthorne on 07/23/2019 16:03:13 -------------------------------------------------------------------------------- Wound Assessment Details Patient Name: Date of  Service: [name], [name] 07/21/2019 10:45 AM Medical Record [id number] Patient Account Number: [id number] Date of Birth/Sex: Treating RN: February 05, 1961 (59 y.o. [name] Primary Care Aarya Quebedeaux: [name] Other Clinician: Referring Julianah Marciel: Treating Jadah Bobak/Extender:Stone III, Danny Lawless, Randon Goldsmith in Treatment: 105 Wound Status Wound Number: 38 Primary Pressure Ulcer Etiology: Wound Location: Right Toe Fifth Wound Open Wounding Event: Shear/Friction Status: Date Acquired: 07/21/2019 Date Acquired: 07/21/2019 Comorbid Deep Vein Thrombosis, Colitis, Weeks Of Treatment: 0 History: Osteomyelitis, Neuropathy, Quadriplegia, Clustered Wound: No Confinement Anxiety Photos Wound Measurements Length: (cm) 0.7 Width: (cm) 0.8 Depth: (cm) 0.2 Area: (cm) 0.44 Volume: (cm) 0.088 Wound Description Classification: Category/Stage II Wound Margin: Distinct, outline attached Exudate Amount: Medium Exudate Type: Serosanguineous Exudate Color: red, brown Wound Bed Granulation Amount: Large (67-100%) Granulation Quality: Red Necrotic Amount: None Present (0%) or After Cleansing: No Fibrino No Exposed Structure xposed: No r (Subcutaneous Tissue) Exposed: No xposed: No xposed: No posed: No osed: No % Reduction in Area: 0% % Reduction in Volume: 0% Epithelialization: Small (1-33%) Tunneling: No Undermining: No Foul Od Slough/ Fascia E Fat Laye Tendon E Muscle E Joint Ex Bone Exp Treatment Notes Wound #38 (Right Toe Fifth) 1. Cleanse With Wound Cleanser 3. Primary Dressing Applied Calcium Alginate Ag Other primary dressing (specifiy in notes) 4. Secondary Dressing Dry Gauze Roll Gauze 5. Secured With Medipore tape 7. Footwear/Offloading device applied Multipodus Splint/Boot Notes primary dressing bactroban ointment used instead of gentamycin. no gentamycin in clinic. Electronic Signature(s) Signed: 07/23/2019 5:01:55 PM By: Baruch Gouty RN, BSN Signed: 07/23/2019 5:12:28 PM By: Mikeal Hawthorne EMT/HBOT Previous Signature: 07/22/2019 6:07:18 PM Version By: Deon Pilling Entered By: Mikeal Hawthorne on 07/23/2019 16:03:51 -------------------------------------------------------------------------------- Wound Assessment Details Patient Name: Date of Service: [name], [name] 07/21/2019 10:45 AM Medical Record [id number] Patient Account Number: [id number] Date of Birth/Sex: Treating RN: Mar 21, 1961 (59 y.o. [name] Primary Care Jocob Dambach: [name] Other Clinician: Referring Aeson Sawyers: Treating Keia Rask/Extender:Stone III, Danny Lawless, Randon Goldsmith in Treatment: 105 Wound Status Wound Number: 39 Primary Abrasion Etiology: Wound Location: Right Toe Fourth Wound Open Wounding Event: Trauma Status: Date Acquired: 07/21/2019 Comorbid Deep Vein Thrombosis, Colitis, Weeks Of Treatment: 0 History: Osteomyelitis, Neuropathy, Quadriplegia, Clustered Wound: No Confinement Anxiety Photos Wound Measurements Length: (cm) 0.4 % Reduct Width: (cm) 0.3 % Reduct Depth: (cm) 0.1 Epitheli Area: (cm) 0.094 Tunneli Volume: (cm) 0.009 Undermi Wound Description Classification: Full Thickness Without Exposed Support Foul Odo Structures Slough/F Wound Flat and Intact Margin: Exudate None Present Amount: Wound Bed Granulation Amount: None Present (0%) Necrotic Amount: Large (67-100%) Fascia E Necrotic Quality: Eschar Fat Laye Tendon E Muscle E Joint Ex Bone Exp r After Cleansing: No ibrino No Exposed Structure xposed: No r (Subcutaneous Tissue) Exposed: Yes xposed: No xposed: No posed: No osed: No ion in Area: 0% ion in Volume: 0% alization: Small (1-33%) ng: No ning: No Treatment Notes Wound #39 (Right Toe Fourth) 1. Cleanse With Wound Cleanser 3. Primary Dressing Applied Calcium Alginate Ag Other primary dressing (specifiy in notes) 4. Secondary Dressing Dry Gauze Roll Gauze 5.  Secured With Medipore tape 7. Footwear/Offloading device applied Multipodus Splint/Boot Notes primary dressing bactroban ointment used instead of gentamycin. no gentamycin in clinic. Electronic Signature(s) Signed: 07/23/2019 5:01:55 PM By: Baruch Gouty RN, BSN Signed: 07/23/2019 5:12:28 PM By: Mikeal Hawthorne EMT/HBOT Previous Signature: 07/21/2019 6:08:21 PM Version By: Baruch Gouty RN, BSN Entered By: Mikeal Hawthorne on 07/23/2019 16:04:18 -------------------------------------------------------------------------------- Vitals  ByMikeal Hawthorne on 07/23/2019 16:03:13 -------------------------------------------------------------------------------- Wound Assessment Details Patient Name: Date of  Service: [name], [name] 07/21/2019 10:45 AM Medical Record [id number] Patient Account Number: [id number] Date of Birth/Sex: Treating RN: February 05, 1961 (59 y.o. [name] Primary Care Aarya Quebedeaux: [name] Other Clinician: Referring Julianah Marciel: Treating Jadah Bobak/Extender:Stone III, Danny Lawless, Randon Goldsmith in Treatment: 105 Wound Status Wound Number: 38 Primary Pressure Ulcer Etiology: Wound Location: Right Toe Fifth Wound Open Wounding Event: Shear/Friction Status: Date Acquired: 07/21/2019 Date Acquired: 07/21/2019 Comorbid Deep Vein Thrombosis, Colitis, Weeks Of Treatment: 0 History: Osteomyelitis, Neuropathy, Quadriplegia, Clustered Wound: No Confinement Anxiety Photos Wound Measurements Length: (cm) 0.7 Width: (cm) 0.8 Depth: (cm) 0.2 Area: (cm) 0.44 Volume: (cm) 0.088 Wound Description Classification: Category/Stage II Wound Margin: Distinct, outline attached Exudate Amount: Medium Exudate Type: Serosanguineous Exudate Color: red, brown Wound Bed Granulation Amount: Large (67-100%) Granulation Quality: Red Necrotic Amount: None Present (0%) or After Cleansing: No Fibrino No Exposed Structure xposed: No r (Subcutaneous Tissue) Exposed: No xposed: No xposed: No posed: No osed: No % Reduction in Area: 0% % Reduction in Volume: 0% Epithelialization: Small (1-33%) Tunneling: No Undermining: No Foul Od Slough/ Fascia E Fat Laye Tendon E Muscle E Joint Ex Bone Exp Treatment Notes Wound #38 (Right Toe Fifth) 1. Cleanse With Wound Cleanser 3. Primary Dressing Applied Calcium Alginate Ag Other primary dressing (specifiy in notes) 4. Secondary Dressing Dry Gauze Roll Gauze 5. Secured With Medipore tape 7. Footwear/Offloading device applied Multipodus Splint/Boot Notes primary dressing bactroban ointment used instead of gentamycin. no gentamycin in clinic. Electronic Signature(s) Signed: 07/23/2019 5:01:55 PM By: Baruch Gouty RN, BSN Signed: 07/23/2019 5:12:28 PM By: Mikeal Hawthorne EMT/HBOT Previous Signature: 07/22/2019 6:07:18 PM Version By: Deon Pilling Entered By: Mikeal Hawthorne on 07/23/2019 16:03:51 -------------------------------------------------------------------------------- Wound Assessment Details Patient Name: Date of Service: [name], [name] 07/21/2019 10:45 AM Medical Record [id number] Patient Account Number: [id number] Date of Birth/Sex: Treating RN: Mar 21, 1961 (59 y.o. [name] Primary Care Jocob Dambach: [name] Other Clinician: Referring Aeson Sawyers: Treating Keia Rask/Extender:Stone III, Danny Lawless, Randon Goldsmith in Treatment: 105 Wound Status Wound Number: 39 Primary Abrasion Etiology: Wound Location: Right Toe Fourth Wound Open Wounding Event: Trauma Status: Date Acquired: 07/21/2019 Comorbid Deep Vein Thrombosis, Colitis, Weeks Of Treatment: 0 History: Osteomyelitis, Neuropathy, Quadriplegia, Clustered Wound: No Confinement Anxiety Photos Wound Measurements Length: (cm) 0.4 % Reduct Width: (cm) 0.3 % Reduct Depth: (cm) 0.1 Epitheli Area: (cm) 0.094 Tunneli Volume: (cm) 0.009 Undermi Wound Description Classification: Full Thickness Without Exposed Support Foul Odo Structures Slough/F Wound Flat and Intact Margin: Exudate None Present Amount: Wound Bed Granulation Amount: None Present (0%) Necrotic Amount: Large (67-100%) Fascia E Necrotic Quality: Eschar Fat Laye Tendon E Muscle E Joint Ex Bone Exp r After Cleansing: No ibrino No Exposed Structure xposed: No r (Subcutaneous Tissue) Exposed: Yes xposed: No xposed: No posed: No osed: No ion in Area: 0% ion in Volume: 0% alization: Small (1-33%) ng: No ning: No Treatment Notes Wound #39 (Right Toe Fourth) 1. Cleanse With Wound Cleanser 3. Primary Dressing Applied Calcium Alginate Ag Other primary dressing (specifiy in notes) 4. Secondary Dressing Dry Gauze Roll Gauze 5.  Secured With Medipore tape 7. Footwear/Offloading device applied Multipodus Splint/Boot Notes primary dressing bactroban ointment used instead of gentamycin. no gentamycin in clinic. Electronic Signature(s) Signed: 07/23/2019 5:01:55 PM By: Baruch Gouty RN, BSN Signed: 07/23/2019 5:12:28 PM By: Mikeal Hawthorne EMT/HBOT Previous Signature: 07/21/2019 6:08:21 PM Version By: Baruch Gouty RN, BSN Entered By: Mikeal Hawthorne on 07/23/2019 16:04:18 -------------------------------------------------------------------------------- Vitals  [name], [name] ([id number]) Visit Report for 07/21/2019 Arrival Information Details Patient Name: Date of Service: [name], [name] 07/21/2019 10:45 AM Medical Record [id number] Patient Account Number: [id number] Date of Birth/Sex: Treating RN: Dec 03, 1960 (59 y.o. [name] Primary Care Ciclaly Mulcahey: [name] Other Clinician: Referring Gennavieve Huq: Treating Eufemio Strahm/Extender:Stone III, Danny Lawless, Randon Goldsmith in Treatment: 105 Visit Information History Since Last Visit Added or deleted any medications: No Patient Arrived: Wheel Chair Any new allergies or adverse reactions: No Arrival Time: 11:54 Had a fall or experienced change in No Accompanied By: self activities of daily living that may affect Transfer Assistance: None risk of falls: Patient Identification Verified: Yes Signs or symptoms of abuse/neglect No Secondary Verification Process Yes since last visito Completed: Hospitalized since last visit: No Patient Requires Transmission- No Implantable device outside of the clinic No Based Precautions: excluding Patient Has Alerts: Yes cellular tissue based products placed in Patient Alerts: R ABI 1.14, L the center ABI 1.16 since last visit: Has Dressing in Place as Prescribed: Yes Has Footwear/Offloading in Place as Yes Prescribed: Left: Multipodus Split/Boot Right: Multipodus Split/Boot Pain Present Now: No Electronic Signature(s) Signed: 07/22/2019 6:07:18 PM By: Deon Pilling Entered By: Deon Pilling on 07/21/2019 11:54:34 -------------------------------------------------------------------------------- Encounter Discharge Information Details Patient Name: Date of Service: [name], [name] 07/21/2019 10:45 AM Medical Record [id number] Patient Account Number: [id number] Date of Birth/Sex: Treating RN: 18-Apr-1961 (59 y.o. [name] Primary Care Willodene Stallings: [name] Other Clinician: Referring Norwood Quezada: Treating  Austen Wygant/Extender:Stone III, Meredith Staggers in Treatment: [phone] Encounter Discharge Information Items Post Procedure Vitals Discharge Condition: Stable Temperature (F): 97.5 Ambulatory Status: Wheelchair Pulse (bpm): 75 Discharge Destination: [place] Respiratory Rate (breaths/min): 20 Telephoned: No Blood Pressure (mmHg): 148/86 Orders Sent: Yes Transportation: Private Auto Accompanied By: self Schedule Follow-up Yes Appointment: Clinical Summary of Care: Electronic Signature(s) Signed: 07/22/2019 6:07:18 PM By: Deon Pilling Entered By: Deon Pilling on 07/21/2019 12:44:56 -------------------------------------------------------------------------------- Lower Extremity Assessment Details Patient Name: Date of Service: [name], [name] 07/21/2019 10:45 AM Medical Record [id number] Patient Account Number: [id number] Date of Birth/Sex: Treating RN: [birth year] (58 y.o. [name] Primary Care Yama Nielson: [name] Other Clinician: Referring Kourtlyn Charlet: Treating Sennie Borden/Extender:Stone III, Danny Lawless, Randon Goldsmith in Treatment: 105 Edema Assessment Assessed: [Left: No] [Right: Yes] Edema: [Left: No] [Right: Yes] Calf Left: Right: Point of Measurement: cm From Medial Instep cm 22.5 cm Ankle Left: Right: Point of Measurement: cm From Medial Instep cm 18.5 cm Electronic Signature(s) Signed: 07/22/2019 6:07:18 PM By: Deon Pilling Entered By: Deon Pilling on 07/21/2019 11:56:44 -------------------------------------------------------------------------------- Multi-Disciplinary Care Plan Details Patient Name: Date of Service: THEO, [name] 07/21/2019 10:45 AM Medical Record [id number] Patient Account Number: [id number] Date of Birth/Sex: Treating RN: 1961-05-26 (59 y.o. [name] Primary Care Myriam Brandhorst: [name] Other Clinician: Referring Quintara Bost: Treating Cherrill Scrima/Extender:Stone III, Danny Lawless,  Randon Goldsmith in Treatment: 105 Active Inactive Wound/Skin Impairment Nursing Diagnoses: Impaired tissue integrity Knowledge deficit related to ulceration/compromised skin integrity Goals: Patient/caregiver will verbalize understanding of skin care regimen Date Initiated: 07/14/2017 Target Resolution Date: 08/18/2019 Goal Status: Active Ulcer/skin breakdown will have a volume reduction of 30% by week 4 Date Initiated: 07/14/2017 Date Inactivated: 08/18/2017 Target Resolution Date: 08/15/2017 Goal Status: Met Ulcer/skin breakdown will have a volume reduction of 50% by week 8 Date Initiated: 07/14/2017 Date Inactivated: 10/09/2017 Target Resolution Date: 09/19/2017 Goal Status: Unmet Unmet Reason: infection Ulcer/skin breakdown will heal within 14 weeks Date Initiated: 07/14/2017 Date Inactivated: 10/24/2017 Target Resolution Date: 10/24/2017 Unmet Reason: complex Goal

## 2019-07-29 ENCOUNTER — Telehealth: Payer: Self-pay | Admitting: Family Medicine

## 2019-07-29 NOTE — Telephone Encounter (Signed)
Caryn Bee from [facility] called to inquire about an order faxed in regarding a wheel chair repair for [name] ([id number]) Please call him at [phone]

## 2019-07-29 NOTE — Telephone Encounter (Signed)
Looked for paperwork from [facility] in PCP's box.  Request was originally faxed to [facility] on 08/20/2019.  Paperwork was not located. Called Caryn Bee back and requested that he refax document (Standard written order) for parts to repair patient's wheelchair.  Spoke to Preston at [facility] and she assured me that she would pass along information to hopefully the correct Caryn Bee as there are two that work at the office.  She thinks it is Elayne Snare.  Glennie Hawk, CMA

## 2019-07-31 NOTE — Telephone Encounter (Signed)
Forms were completed for the second time and placed in box to be faxed.

## 2019-08-04 ENCOUNTER — Other Ambulatory Visit: Payer: Self-pay

## 2019-08-04 ENCOUNTER — Encounter (HOSPITAL_BASED_OUTPATIENT_CLINIC_OR_DEPARTMENT_OTHER): Payer: Medicare Other | Attending: Physician Assistant | Admitting: Physician Assistant

## 2019-08-04 DIAGNOSIS — L89522 Pressure ulcer of left ankle, stage 2: Secondary | ICD-10-CM | POA: Diagnosis not present

## 2019-08-04 DIAGNOSIS — L89892 Pressure ulcer of other site, stage 2: Secondary | ICD-10-CM | POA: Insufficient documentation

## 2019-08-04 DIAGNOSIS — R532 Functional quadriplegia: Secondary | ICD-10-CM | POA: Diagnosis not present

## 2019-08-04 DIAGNOSIS — Z8616 Personal history of COVID-19: Secondary | ICD-10-CM | POA: Diagnosis not present

## 2019-08-04 DIAGNOSIS — G809 Cerebral palsy, unspecified: Secondary | ICD-10-CM | POA: Insufficient documentation

## 2019-08-04 DIAGNOSIS — F419 Anxiety disorder, unspecified: Secondary | ICD-10-CM | POA: Diagnosis not present

## 2019-08-04 DIAGNOSIS — I872 Venous insufficiency (chronic) (peripheral): Secondary | ICD-10-CM | POA: Insufficient documentation

## 2019-08-04 DIAGNOSIS — L89152 Pressure ulcer of sacral region, stage 2: Secondary | ICD-10-CM | POA: Insufficient documentation

## 2019-08-04 DIAGNOSIS — Z86718 Personal history of other venous thrombosis and embolism: Secondary | ICD-10-CM | POA: Diagnosis not present

## 2019-08-04 DIAGNOSIS — L97322 Non-pressure chronic ulcer of left 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: Secondary | ICD-10-CM | POA: Diagnosis present

## 2019-08-04 DIAGNOSIS — L89893 Pressure ulcer of other site, stage 3: Secondary | ICD-10-CM | POA: Diagnosis not present

## 2019-08-04 NOTE — Progress Notes (Addendum)
part of right foot with fat 12/25/2017 12/25/2017 layer exposed L97.322 Non-pressure chronic ulcer of left 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 05/01/2018 05/01/2018 L97.521 Non-pressure chronic ulcer of other part of left foot limited to 11/10/2018 11/10/2018 breakdown of skin Electronic Signature(s) Signed: 08/04/2019 10:50:02 AM By: Lenda Kelp PA-C Entered By: Lenda Kelp on 08/04/2019 10:50:01 -------------------------------------------------------------------------------- Progress Note Details Patient Name: Date of Service: [name], [name] 08/04/2019 10:45 AM Medical Record [id number] Patient Account Number: [id number] Date of Birth/Sex: Treating RN: 04/15/1961 (59 y.o. [name] Primary Care Provider: Orpah [name] Other Clinician: Referring Provider: Treating Provider/Extender:[name], Reginal Lutes, Orlie Pollen in Treatment: 107 Subjective Chief Complaint Information obtained from Patient Patient is here for multiple wounds on his bilateral feet and left ankle. History of Present Illness (HPI) 11/23/15; this is a patient I remember from a previous stay in this clinic in 2012. The patient says this was for a wound in this same area as currently although I have no information to verify that. Most of the information  is provided by his caregiver from the group home where he resides. Apparently he has had a wound in this area for 8 months. He also has erythema around this area and has had multiple rounds of antibiotics apparently with some improvement but the erythema is persists. He apparently had a fracture in the ankle 2 months ago and was seen by Dr. Lajoyce Corners . He apparently had a splint applied and more recently a heel offloading boot. He had home health coming out to a week ago not clear what they are dressing this with but they apparently discharged him perhaps because of one of Dr. 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 He has a Nurse, adult for transferring at home he is nonambulatory and does not wear footwear. He is not a diabetic. Looking through [facility] he appears to have fairly active primary care. The area on the ankle is variably labeled as a pressure area and/or chronic venous insufficiency. He had a recent venous ultrasound on 5/25 that did not show a DVT in the left leg however this was not a reflux study. There was no superficial DVT either. Arterial studies on 10/15/14 showed a left ABI of 1.16 Center right of 1.12 waveforms were triphasic he does not have significant arterial disease. He has had recent x-rays of the left foot and ankle. The left foot did not show any abnormality. Left ankle x-ray showed a spiral fracture of the left tibial diaphysis without evidence of osteomyelitis. 11/30/15 culture I did last week showed pseudomonas I changed him to oral ciprofloxacin. Erythema is a lot better. 12/07/15 area around the wound shows considerable improvement of the periwound erythema. 12/14/15; the patient has a improvement of the periwound erythema which in retrospect I think with cellulitis successfully treated. We have been using Iodoflex started last week 12/25/15 wound on the left medial malleolus and dorsal left foot. Theraskin #1  applied READMISSION 02/27/16; the patient was admitted to hospital from 8/14 through 01/18/16 initially felt to have cellulitis of his left lower leg however he was noted to have significant persistent pain and swelling. A duplex ultrasound was ordered that was negative. [name] an x-ray was done that showed a spiral fracture of the left leg. With posterior displacement and angulation at the mid left tibia. Orthopedics saw the patient and splinted the leg. He was then sent to [facility] skilled facility and only return to his group home  [name] on 08/04/2019 11:33:16 -------------------------------------------------------------------------------- Physician Orders Details Patient Name: Date of Service: [name], [name] 08/04/2019 10:45 AM Medical Record [id number] Patient Account Number: [id number] Date of Birth/Sex: Treating RN: 1960/12/09 (59 y.o. [name] Primary Care Provider: Orpah [name] Other Clinician: Referring Provider: Treating Provider/Extender:[name], Reginal Lutes, Orlie Pollen in Treatment: [id number] Verbal / Phone Orders: No Diagnosis Coding ICD-10 Coding Code Description [phone] Pressure ulcer of left ankle, stage 2 L89.892 Pressure ulcer of other site, stage 2 L89.893 Pressure ulcer of other site, stage 3 I73.89 Other specified peripheral vascular diseases Follow-up Appointments Return Appointment in 2 weeks. Dressing Change Frequency Wound #37 Right,Lateral Foot Change dressing three times week. Wound #38 Right Toe Fifth Change dressing three times week. Wound #39 Right Toe Fourth Change dressing three times week. Skin Barriers/Peri-Wound Care Barrier cream - to sacrum as needed Moisturizing lotion - both legs daily Wound Cleansing Wound #37 Right,Lateral Foot May shower and wash wound with soap and water. - with dressing changes Primary Wound Dressing Wound #37 Right,Lateral Foot Calcium Alginate with Silver - thin layer of gentamicin ointment on wound bed under alginate Wound #38 Right Toe Fifth Calcium Alginate with Silver - thin layer of gentamicin ointment on wound bed under alginate Wound #39 Right Toe Fourth Calcium Alginate with Silver - thin layer of  gentamicin ointment on wound bed under alginate Secondary Dressing Wound #37 Right,Lateral Foot Kerlix/Rolled Gauze Dry Gauze Wound #38 Right Toe Fifth Kerlix/Rolled Gauze Dry Gauze Wound #39 Right Toe Fourth Kerlix/Rolled Gauze Dry Gauze Edema Control Kerlix and Coban - Right Lower Extremity - wrap from base of toes to tibial tuberosity Elevate legs to the level of the heart or above for 30 minutes daily and/or when sitting, a frequency of: Support Garment 10-20 mm/Hg pressure to: - support hose both legs daily Off-Loading Multipodus Splint to: - sage boots both feet at all times. please float feet to offload pressure to lateral side of feet. Gel mattress overlay (Group 1) - for extended mattress/bed Turn and reposition every 2 hours Home Health Continue Home Health skilled nursing for wound care. Beverly Gust Electronic Signature(s) Signed: 08/04/2019 5:29:37 PM By: Lenda Kelp PA-C Signed: 08/04/2019 5:43:44 PM By: Zenaida Deed RN, BSN Entered By: Zenaida Deed on 08/04/2019 11:32:52 -------------------------------------------------------------------------------- Problem List Details Patient Name: Date of Service: [name], [name] 08/04/2019 10:45 AM Medical Record [id number] Patient Account Number: [id number] Date of Birth/Sex: Treating RN: December 07, 1960 (59 y.o. [name] Primary Care Provider: Orpah [name] Other Clinician: Referring Provider: Treating Provider/Extender:[name], Reginal Lutes, Orlie Pollen in Treatment: 107 Active Problems ICD-10 Evaluated Encounter Code Description Active Date Today Diagnosis L89.522 Pressure ulcer of left ankle, stage 2 04/28/2019 No Yes L89.892 Pressure ulcer of other site, stage 2 06/23/2019 No Yes L89.893 Pressure ulcer of other site, stage 3 06/23/2019 No Yes I73.89 Other specified peripheral vascular diseases 08/26/2018 No Yes Inactive Problems ICD-10 Code Description Active Date Inactive Date L97.511  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 07/14/2017 07/14/2017 breakdown of skin L03.116 Cellulitis of left lower limb 08/26/2018 08/26/2018 L89.312 Pressure ulcer of right buttock, stage 2 12/25/2017 12/25/2017 L03.115 Cellulitis of right lower limb 12/25/2017 12/25/2017 L89.152 Pressure ulcer of sacral region, stage 2 12/25/2017 12/25/2017 L89.322 Pressure ulcer of left buttock, stage 2 12/25/2017 12/25/2017 L03.116 Cellulitis of left lower limb 11/23/2018 11/23/2018 Resolved Problems ICD-10 Code Description Active Date Resolved Date L89.152 Pressure ulcer of sacral region, stage 2 03/31/2019 03/31/2019 L97.512 Non-pressure chronic ulcer of other  [name], [name] ([id number]) Visit Report for 08/04/2019 Chief Complaint Document Details Patient Name: Date of Service: [name], [name] 08/04/2019 10:45 AM Medical Record [id number] Patient Account Number: [id number] Date of Birth/Sex: Treating RN: [birth year] (59 y.o. [name] Primary Care Provider: Orpah [name] Other Clinician: Referring Provider: Treating Provider/Extender:[name], Reginal Lutes, Orlie Pollen in Treatment: 107 Information Obtained from: Patient Chief Complaint Patient is here for multiple wounds on his bilateral feet and left ankle. Electronic Signature(s) Signed: 08/04/2019 10:50:10 AM By: Lenda Kelp PA-C Entered By: Lenda Kelp on 08/04/2019 10:50:10 -------------------------------------------------------------------------------- HPI Details Patient Name: Date of Service: [name], [name] 08/04/2019 10:45 AM Medical Record [id number] Patient Account Number: [id number] Date of Birth/Sex: Treating RN: Nov 03, 1960 (59 y.o. [name] Primary Care Provider: Orpah [name] Other Clinician: Referring Provider: Treating Provider/Extender:[name], Reginal Lutes, Orlie Pollen in Treatment: 107 History of Present Illness HPI Description: 11/23/15; this is a patient I remember from a previous stay in this clinic in 2012. The patient says this was for a wound in this same area as currently although I have no information to verify that. Most of the information is provided by his caregiver from the group home where he resides. Apparently he has had a wound in this area for 8 months. He also has erythema around this area and has had multiple rounds of antibiotics apparently with some improvement but the erythema is persists. He apparently had a fracture in the ankle 2 months ago and was seen by Dr. Lajoyce Corners . He apparently had a splint applied and more recently a heel offloading boot. He had home health coming out to a week ago  not clear what they are dressing this with but they apparently discharged him perhaps because of one of Dr. 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 He has a Nurse, adult for transferring at home he is nonambulatory and does not wear footwear. He is not a diabetic. Looking through [facility] he appears to have fairly active primary care. The area on the ankle is variably labeled as a pressure area and/or chronic venous insufficiency. He had a recent venous ultrasound on 5/25 that did not show a DVT in the left leg however this was not a reflux study. There was no superficial DVT either. Arterial studies on 10/15/14 showed a left ABI of 1.16 Center right of 1.12 waveforms were triphasic he does not have significant arterial disease. He has had recent x-rays of the left foot and ankle. The left foot did not show any abnormality. Left ankle x-ray showed a spiral fracture of the left tibial diaphysis without evidence of osteomyelitis. 11/30/15 culture I did last week showed pseudomonas I changed him to oral ciprofloxacin. Erythema is a lot better. 12/07/15 area around the wound shows considerable improvement of the periwound erythema. 12/14/15; the patient has a improvement of the periwound erythema which in retrospect I think with cellulitis successfully treated. We have been using Iodoflex started last week 12/25/15 wound on the left medial malleolus and dorsal left foot. Theraskin #1 applied READMISSION 02/27/16; the patient was admitted to hospital from 8/14 through 01/18/16 initially felt to have cellulitis of his left lower leg however he was noted to have significant persistent pain and swelling. A duplex ultrasound was ordered that was negative. [name] an x-ray was done that showed a spiral fracture of the left leg. With posterior displacement and angulation at the mid left tibia. Orthopedics saw the patient and splinted  [name], [name] ([id number]) Visit Report for 08/04/2019 Chief Complaint Document Details Patient Name: Date of Service: [name], [name] 08/04/2019 10:45 AM Medical Record [id number] Patient Account Number: [id number] Date of Birth/Sex: Treating RN: [birth year] (59 y.o. [name] Primary Care Provider: Orpah [name] Other Clinician: Referring Provider: Treating Provider/Extender:[name], Reginal Lutes, Orlie Pollen in Treatment: 107 Information Obtained from: Patient Chief Complaint Patient is here for multiple wounds on his bilateral feet and left ankle. Electronic Signature(s) Signed: 08/04/2019 10:50:10 AM By: Lenda Kelp PA-C Entered By: Lenda Kelp on 08/04/2019 10:50:10 -------------------------------------------------------------------------------- HPI Details Patient Name: Date of Service: [name], [name] 08/04/2019 10:45 AM Medical Record [id number] Patient Account Number: [id number] Date of Birth/Sex: Treating RN: Nov 03, 1960 (59 y.o. [name] Primary Care Provider: Orpah [name] Other Clinician: Referring Provider: Treating Provider/Extender:[name], Reginal Lutes, Orlie Pollen in Treatment: 107 History of Present Illness HPI Description: 11/23/15; this is a patient I remember from a previous stay in this clinic in 2012. The patient says this was for a wound in this same area as currently although I have no information to verify that. Most of the information is provided by his caregiver from the group home where he resides. Apparently he has had a wound in this area for 8 months. He also has erythema around this area and has had multiple rounds of antibiotics apparently with some improvement but the erythema is persists. He apparently had a fracture in the ankle 2 months ago and was seen by Dr. Lajoyce Corners . He apparently had a splint applied and more recently a heel offloading boot. He had home health coming out to a week ago  not clear what they are dressing this with but they apparently discharged him perhaps because of one of Dr. 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 He has a Nurse, adult for transferring at home he is nonambulatory and does not wear footwear. He is not a diabetic. Looking through [facility] he appears to have fairly active primary care. The area on the ankle is variably labeled as a pressure area and/or chronic venous insufficiency. He had a recent venous ultrasound on 5/25 that did not show a DVT in the left leg however this was not a reflux study. There was no superficial DVT either. Arterial studies on 10/15/14 showed a left ABI of 1.16 Center right of 1.12 waveforms were triphasic he does not have significant arterial disease. He has had recent x-rays of the left foot and ankle. The left foot did not show any abnormality. Left ankle x-ray showed a spiral fracture of the left tibial diaphysis without evidence of osteomyelitis. 11/30/15 culture I did last week showed pseudomonas I changed him to oral ciprofloxacin. Erythema is a lot better. 12/07/15 area around the wound shows considerable improvement of the periwound erythema. 12/14/15; the patient has a improvement of the periwound erythema which in retrospect I think with cellulitis successfully treated. We have been using Iodoflex started last week 12/25/15 wound on the left medial malleolus and dorsal left foot. Theraskin #1 applied READMISSION 02/27/16; the patient was admitted to hospital from 8/14 through 01/18/16 initially felt to have cellulitis of his left lower leg however he was noted to have significant persistent pain and swelling. A duplex ultrasound was ordered that was negative. [name] an x-ray was done that showed a spiral fracture of the left leg. With posterior displacement and angulation at the mid left tibia. Orthopedics saw the patient and splinted  part of right foot with fat 12/25/2017 12/25/2017 layer exposed L97.322 Non-pressure chronic ulcer of left 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 05/01/2018 05/01/2018 L97.521 Non-pressure chronic ulcer of other part of left foot limited to 11/10/2018 11/10/2018 breakdown of skin Electronic Signature(s) Signed: 08/04/2019 10:50:02 AM By: Lenda Kelp PA-C Entered By: Lenda Kelp on 08/04/2019 10:50:01 -------------------------------------------------------------------------------- Progress Note Details Patient Name: Date of Service: [name], [name] 08/04/2019 10:45 AM Medical Record [id number] Patient Account Number: [id number] Date of Birth/Sex: Treating RN: 04/15/1961 (59 y.o. [name] Primary Care Provider: Orpah [name] Other Clinician: Referring Provider: Treating Provider/Extender:[name], Reginal Lutes, Orlie Pollen in Treatment: 107 Subjective Chief Complaint Information obtained from Patient Patient is here for multiple wounds on his bilateral feet and left ankle. History of Present Illness (HPI) 11/23/15; this is a patient I remember from a previous stay in this clinic in 2012. The patient says this was for a wound in this same area as currently although I have no information to verify that. Most of the information  is provided by his caregiver from the group home where he resides. Apparently he has had a wound in this area for 8 months. He also has erythema around this area and has had multiple rounds of antibiotics apparently with some improvement but the erythema is persists. He apparently had a fracture in the ankle 2 months ago and was seen by Dr. Lajoyce Corners . He apparently had a splint applied and more recently a heel offloading boot. He had home health coming out to a week ago not clear what they are dressing this with but they apparently discharged him perhaps because of one of Dr. 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 He has a Nurse, adult for transferring at home he is nonambulatory and does not wear footwear. He is not a diabetic. Looking through [facility] he appears to have fairly active primary care. The area on the ankle is variably labeled as a pressure area and/or chronic venous insufficiency. He had a recent venous ultrasound on 5/25 that did not show a DVT in the left leg however this was not a reflux study. There was no superficial DVT either. Arterial studies on 10/15/14 showed a left ABI of 1.16 Center right of 1.12 waveforms were triphasic he does not have significant arterial disease. He has had recent x-rays of the left foot and ankle. The left foot did not show any abnormality. Left ankle x-ray showed a spiral fracture of the left tibial diaphysis without evidence of osteomyelitis. 11/30/15 culture I did last week showed pseudomonas I changed him to oral ciprofloxacin. Erythema is a lot better. 12/07/15 area around the wound shows considerable improvement of the periwound erythema. 12/14/15; the patient has a improvement of the periwound erythema which in retrospect I think with cellulitis successfully treated. We have been using Iodoflex started last week 12/25/15 wound on the left medial malleolus and dorsal left foot. Theraskin #1  applied READMISSION 02/27/16; the patient was admitted to hospital from 8/14 through 01/18/16 initially felt to have cellulitis of his left lower leg however he was noted to have significant persistent pain and swelling. A duplex ultrasound was ordered that was negative. [name] an x-ray was done that showed a spiral fracture of the left leg. With posterior displacement and angulation at the mid left tibia. Orthopedics saw the patient and splinted the leg. He was then sent to [facility] skilled facility and only return to his group home  [name], [name] ([id number]) Visit Report for 08/04/2019 Chief Complaint Document Details Patient Name: Date of Service: [name], [name] 08/04/2019 10:45 AM Medical Record [id number] Patient Account Number: [id number] Date of Birth/Sex: Treating RN: [birth year] (59 y.o. [name] Primary Care Provider: Orpah [name] Other Clinician: Referring Provider: Treating Provider/Extender:[name], Reginal Lutes, Orlie Pollen in Treatment: 107 Information Obtained from: Patient Chief Complaint Patient is here for multiple wounds on his bilateral feet and left ankle. Electronic Signature(s) Signed: 08/04/2019 10:50:10 AM By: Lenda Kelp PA-C Entered By: Lenda Kelp on 08/04/2019 10:50:10 -------------------------------------------------------------------------------- HPI Details Patient Name: Date of Service: [name], [name] 08/04/2019 10:45 AM Medical Record [id number] Patient Account Number: [id number] Date of Birth/Sex: Treating RN: Nov 03, 1960 (59 y.o. [name] Primary Care Provider: Orpah [name] Other Clinician: Referring Provider: Treating Provider/Extender:[name], Reginal Lutes, Orlie Pollen in Treatment: 107 History of Present Illness HPI Description: 11/23/15; this is a patient I remember from a previous stay in this clinic in 2012. The patient says this was for a wound in this same area as currently although I have no information to verify that. Most of the information is provided by his caregiver from the group home where he resides. Apparently he has had a wound in this area for 8 months. He also has erythema around this area and has had multiple rounds of antibiotics apparently with some improvement but the erythema is persists. He apparently had a fracture in the ankle 2 months ago and was seen by Dr. Lajoyce Corners . He apparently had a splint applied and more recently a heel offloading boot. He had home health coming out to a week ago  not clear what they are dressing this with but they apparently discharged him perhaps because of one of Dr. 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 He has a Nurse, adult for transferring at home he is nonambulatory and does not wear footwear. He is not a diabetic. Looking through [facility] he appears to have fairly active primary care. The area on the ankle is variably labeled as a pressure area and/or chronic venous insufficiency. He had a recent venous ultrasound on 5/25 that did not show a DVT in the left leg however this was not a reflux study. There was no superficial DVT either. Arterial studies on 10/15/14 showed a left ABI of 1.16 Center right of 1.12 waveforms were triphasic he does not have significant arterial disease. He has had recent x-rays of the left foot and ankle. The left foot did not show any abnormality. Left ankle x-ray showed a spiral fracture of the left tibial diaphysis without evidence of osteomyelitis. 11/30/15 culture I did last week showed pseudomonas I changed him to oral ciprofloxacin. Erythema is a lot better. 12/07/15 area around the wound shows considerable improvement of the periwound erythema. 12/14/15; the patient has a improvement of the periwound erythema which in retrospect I think with cellulitis successfully treated. We have been using Iodoflex started last week 12/25/15 wound on the left medial malleolus and dorsal left foot. Theraskin #1 applied READMISSION 02/27/16; the patient was admitted to hospital from 8/14 through 01/18/16 initially felt to have cellulitis of his left lower leg however he was noted to have significant persistent pain and swelling. A duplex ultrasound was ordered that was negative. [name] an x-ray was done that showed a spiral fracture of the left leg. With posterior displacement and angulation at the mid left tibia. Orthopedics saw the patient and splinted  [name] on 08/04/2019 11:33:16 -------------------------------------------------------------------------------- Physician Orders Details Patient Name: Date of Service: [name], [name] 08/04/2019 10:45 AM Medical Record [id number] Patient Account Number: [id number] Date of Birth/Sex: Treating RN: 1960/12/09 (59 y.o. [name] Primary Care Provider: Orpah [name] Other Clinician: Referring Provider: Treating Provider/Extender:[name], Reginal Lutes, Orlie Pollen in Treatment: [id number] Verbal / Phone Orders: No Diagnosis Coding ICD-10 Coding Code Description [phone] Pressure ulcer of left ankle, stage 2 L89.892 Pressure ulcer of other site, stage 2 L89.893 Pressure ulcer of other site, stage 3 I73.89 Other specified peripheral vascular diseases Follow-up Appointments Return Appointment in 2 weeks. Dressing Change Frequency Wound #37 Right,Lateral Foot Change dressing three times week. Wound #38 Right Toe Fifth Change dressing three times week. Wound #39 Right Toe Fourth Change dressing three times week. Skin Barriers/Peri-Wound Care Barrier cream - to sacrum as needed Moisturizing lotion - both legs daily Wound Cleansing Wound #37 Right,Lateral Foot May shower and wash wound with soap and water. - with dressing changes Primary Wound Dressing Wound #37 Right,Lateral Foot Calcium Alginate with Silver - thin layer of gentamicin ointment on wound bed under alginate Wound #38 Right Toe Fifth Calcium Alginate with Silver - thin layer of gentamicin ointment on wound bed under alginate Wound #39 Right Toe Fourth Calcium Alginate with Silver - thin layer of  gentamicin ointment on wound bed under alginate Secondary Dressing Wound #37 Right,Lateral Foot Kerlix/Rolled Gauze Dry Gauze Wound #38 Right Toe Fifth Kerlix/Rolled Gauze Dry Gauze Wound #39 Right Toe Fourth Kerlix/Rolled Gauze Dry Gauze Edema Control Kerlix and Coban - Right Lower Extremity - wrap from base of toes to tibial tuberosity Elevate legs to the level of the heart or above for 30 minutes daily and/or when sitting, a frequency of: Support Garment 10-20 mm/Hg pressure to: - support hose both legs daily Off-Loading Multipodus Splint to: - sage boots both feet at all times. please float feet to offload pressure to lateral side of feet. Gel mattress overlay (Group 1) - for extended mattress/bed Turn and reposition every 2 hours Home Health Continue Home Health skilled nursing for wound care. Beverly Gust Electronic Signature(s) Signed: 08/04/2019 5:29:37 PM By: Lenda Kelp PA-C Signed: 08/04/2019 5:43:44 PM By: Zenaida Deed RN, BSN Entered By: Zenaida Deed on 08/04/2019 11:32:52 -------------------------------------------------------------------------------- Problem List Details Patient Name: Date of Service: [name], [name] 08/04/2019 10:45 AM Medical Record [id number] Patient Account Number: [id number] Date of Birth/Sex: Treating RN: December 07, 1960 (59 y.o. [name] Primary Care Provider: Orpah [name] Other Clinician: Referring Provider: Treating Provider/Extender:[name], Reginal Lutes, Orlie Pollen in Treatment: 107 Active Problems ICD-10 Evaluated Encounter Code Description Active Date Today Diagnosis L89.522 Pressure ulcer of left ankle, stage 2 04/28/2019 No Yes L89.892 Pressure ulcer of other site, stage 2 06/23/2019 No Yes L89.893 Pressure ulcer of other site, stage 3 06/23/2019 No Yes I73.89 Other specified peripheral vascular diseases 08/26/2018 No Yes Inactive Problems ICD-10 Code Description Active Date Inactive Date L97.511  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 07/14/2017 07/14/2017 breakdown of skin L03.116 Cellulitis of left lower limb 08/26/2018 08/26/2018 L89.312 Pressure ulcer of right buttock, stage 2 12/25/2017 12/25/2017 L03.115 Cellulitis of right lower limb 12/25/2017 12/25/2017 L89.152 Pressure ulcer of sacral region, stage 2 12/25/2017 12/25/2017 L89.322 Pressure ulcer of left buttock, stage 2 12/25/2017 12/25/2017 L03.116 Cellulitis of left lower limb 11/23/2018 11/23/2018 Resolved Problems ICD-10 Code Description Active Date Resolved Date L89.152 Pressure ulcer of sacral region, stage 2 03/31/2019 03/31/2019 L97.512 Non-pressure chronic ulcer of other  [name] on 08/04/2019 11:33:16 -------------------------------------------------------------------------------- Physician Orders Details Patient Name: Date of Service: [name], [name] 08/04/2019 10:45 AM Medical Record [id number] Patient Account Number: [id number] Date of Birth/Sex: Treating RN: 1960/12/09 (59 y.o. [name] Primary Care Provider: Orpah [name] Other Clinician: Referring Provider: Treating Provider/Extender:[name], Reginal Lutes, Orlie Pollen in Treatment: [id number] Verbal / Phone Orders: No Diagnosis Coding ICD-10 Coding Code Description [phone] Pressure ulcer of left ankle, stage 2 L89.892 Pressure ulcer of other site, stage 2 L89.893 Pressure ulcer of other site, stage 3 I73.89 Other specified peripheral vascular diseases Follow-up Appointments Return Appointment in 2 weeks. Dressing Change Frequency Wound #37 Right,Lateral Foot Change dressing three times week. Wound #38 Right Toe Fifth Change dressing three times week. Wound #39 Right Toe Fourth Change dressing three times week. Skin Barriers/Peri-Wound Care Barrier cream - to sacrum as needed Moisturizing lotion - both legs daily Wound Cleansing Wound #37 Right,Lateral Foot May shower and wash wound with soap and water. - with dressing changes Primary Wound Dressing Wound #37 Right,Lateral Foot Calcium Alginate with Silver - thin layer of gentamicin ointment on wound bed under alginate Wound #38 Right Toe Fifth Calcium Alginate with Silver - thin layer of gentamicin ointment on wound bed under alginate Wound #39 Right Toe Fourth Calcium Alginate with Silver - thin layer of  gentamicin ointment on wound bed under alginate Secondary Dressing Wound #37 Right,Lateral Foot Kerlix/Rolled Gauze Dry Gauze Wound #38 Right Toe Fifth Kerlix/Rolled Gauze Dry Gauze Wound #39 Right Toe Fourth Kerlix/Rolled Gauze Dry Gauze Edema Control Kerlix and Coban - Right Lower Extremity - wrap from base of toes to tibial tuberosity Elevate legs to the level of the heart or above for 30 minutes daily and/or when sitting, a frequency of: Support Garment 10-20 mm/Hg pressure to: - support hose both legs daily Off-Loading Multipodus Splint to: - sage boots both feet at all times. please float feet to offload pressure to lateral side of feet. Gel mattress overlay (Group 1) - for extended mattress/bed Turn and reposition every 2 hours Home Health Continue Home Health skilled nursing for wound care. Beverly Gust Electronic Signature(s) Signed: 08/04/2019 5:29:37 PM By: Lenda Kelp PA-C Signed: 08/04/2019 5:43:44 PM By: Zenaida Deed RN, BSN Entered By: Zenaida Deed on 08/04/2019 11:32:52 -------------------------------------------------------------------------------- Problem List Details Patient Name: Date of Service: [name], [name] 08/04/2019 10:45 AM Medical Record [id number] Patient Account Number: [id number] Date of Birth/Sex: Treating RN: December 07, 1960 (59 y.o. [name] Primary Care Provider: Orpah [name] Other Clinician: Referring Provider: Treating Provider/Extender:[name], Reginal Lutes, Orlie Pollen in Treatment: 107 Active Problems ICD-10 Evaluated Encounter Code Description Active Date Today Diagnosis L89.522 Pressure ulcer of left ankle, stage 2 04/28/2019 No Yes L89.892 Pressure ulcer of other site, stage 2 06/23/2019 No Yes L89.893 Pressure ulcer of other site, stage 3 06/23/2019 No Yes I73.89 Other specified peripheral vascular diseases 08/26/2018 No Yes Inactive Problems ICD-10 Code Description Active Date Inactive Date L97.511  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 07/14/2017 07/14/2017 breakdown of skin L03.116 Cellulitis of left lower limb 08/26/2018 08/26/2018 L89.312 Pressure ulcer of right buttock, stage 2 12/25/2017 12/25/2017 L03.115 Cellulitis of right lower limb 12/25/2017 12/25/2017 L89.152 Pressure ulcer of sacral region, stage 2 12/25/2017 12/25/2017 L89.322 Pressure ulcer of left buttock, stage 2 12/25/2017 12/25/2017 L03.116 Cellulitis of left lower limb 11/23/2018 11/23/2018 Resolved Problems ICD-10 Code Description Active Date Resolved Date L89.152 Pressure ulcer of sacral region, stage 2 03/31/2019 03/31/2019 L97.512 Non-pressure chronic ulcer of other  [name] on 08/04/2019 11:33:16 -------------------------------------------------------------------------------- Physician Orders Details Patient Name: Date of Service: [name], [name] 08/04/2019 10:45 AM Medical Record [id number] Patient Account Number: [id number] Date of Birth/Sex: Treating RN: 1960/12/09 (59 y.o. [name] Primary Care Provider: Orpah [name] Other Clinician: Referring Provider: Treating Provider/Extender:[name], Reginal Lutes, Orlie Pollen in Treatment: [id number] Verbal / Phone Orders: No Diagnosis Coding ICD-10 Coding Code Description [phone] Pressure ulcer of left ankle, stage 2 L89.892 Pressure ulcer of other site, stage 2 L89.893 Pressure ulcer of other site, stage 3 I73.89 Other specified peripheral vascular diseases Follow-up Appointments Return Appointment in 2 weeks. Dressing Change Frequency Wound #37 Right,Lateral Foot Change dressing three times week. Wound #38 Right Toe Fifth Change dressing three times week. Wound #39 Right Toe Fourth Change dressing three times week. Skin Barriers/Peri-Wound Care Barrier cream - to sacrum as needed Moisturizing lotion - both legs daily Wound Cleansing Wound #37 Right,Lateral Foot May shower and wash wound with soap and water. - with dressing changes Primary Wound Dressing Wound #37 Right,Lateral Foot Calcium Alginate with Silver - thin layer of gentamicin ointment on wound bed under alginate Wound #38 Right Toe Fifth Calcium Alginate with Silver - thin layer of gentamicin ointment on wound bed under alginate Wound #39 Right Toe Fourth Calcium Alginate with Silver - thin layer of  gentamicin ointment on wound bed under alginate Secondary Dressing Wound #37 Right,Lateral Foot Kerlix/Rolled Gauze Dry Gauze Wound #38 Right Toe Fifth Kerlix/Rolled Gauze Dry Gauze Wound #39 Right Toe Fourth Kerlix/Rolled Gauze Dry Gauze Edema Control Kerlix and Coban - Right Lower Extremity - wrap from base of toes to tibial tuberosity Elevate legs to the level of the heart or above for 30 minutes daily and/or when sitting, a frequency of: Support Garment 10-20 mm/Hg pressure to: - support hose both legs daily Off-Loading Multipodus Splint to: - sage boots both feet at all times. please float feet to offload pressure to lateral side of feet. Gel mattress overlay (Group 1) - for extended mattress/bed Turn and reposition every 2 hours Home Health Continue Home Health skilled nursing for wound care. Beverly Gust Electronic Signature(s) Signed: 08/04/2019 5:29:37 PM By: Lenda Kelp PA-C Signed: 08/04/2019 5:43:44 PM By: Zenaida Deed RN, BSN Entered By: Zenaida Deed on 08/04/2019 11:32:52 -------------------------------------------------------------------------------- Problem List Details Patient Name: Date of Service: [name], [name] 08/04/2019 10:45 AM Medical Record [id number] Patient Account Number: [id number] Date of Birth/Sex: Treating RN: December 07, 1960 (59 y.o. [name] Primary Care Provider: Orpah [name] Other Clinician: Referring Provider: Treating Provider/Extender:[name], Reginal Lutes, Orlie Pollen in Treatment: 107 Active Problems ICD-10 Evaluated Encounter Code Description Active Date Today Diagnosis L89.522 Pressure ulcer of left ankle, stage 2 04/28/2019 No Yes L89.892 Pressure ulcer of other site, stage 2 06/23/2019 No Yes L89.893 Pressure ulcer of other site, stage 3 06/23/2019 No Yes I73.89 Other specified peripheral vascular diseases 08/26/2018 No Yes Inactive Problems ICD-10 Code Description Active Date Inactive Date L97.511  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 07/14/2017 07/14/2017 breakdown of skin L03.116 Cellulitis of left lower limb 08/26/2018 08/26/2018 L89.312 Pressure ulcer of right buttock, stage 2 12/25/2017 12/25/2017 L03.115 Cellulitis of right lower limb 12/25/2017 12/25/2017 L89.152 Pressure ulcer of sacral region, stage 2 12/25/2017 12/25/2017 L89.322 Pressure ulcer of left buttock, stage 2 12/25/2017 12/25/2017 L03.116 Cellulitis of left lower limb 11/23/2018 11/23/2018 Resolved Problems ICD-10 Code Description Active Date Resolved Date L89.152 Pressure ulcer of sacral region, stage 2 03/31/2019 03/31/2019 L97.512 Non-pressure chronic ulcer of other  [name] on 08/04/2019 11:33:16 -------------------------------------------------------------------------------- Physician Orders Details Patient Name: Date of Service: [name], [name] 08/04/2019 10:45 AM Medical Record [id number] Patient Account Number: [id number] Date of Birth/Sex: Treating RN: 1960/12/09 (59 y.o. [name] Primary Care Provider: Orpah [name] Other Clinician: Referring Provider: Treating Provider/Extender:[name], Reginal Lutes, Orlie Pollen in Treatment: [id number] Verbal / Phone Orders: No Diagnosis Coding ICD-10 Coding Code Description [phone] Pressure ulcer of left ankle, stage 2 L89.892 Pressure ulcer of other site, stage 2 L89.893 Pressure ulcer of other site, stage 3 I73.89 Other specified peripheral vascular diseases Follow-up Appointments Return Appointment in 2 weeks. Dressing Change Frequency Wound #37 Right,Lateral Foot Change dressing three times week. Wound #38 Right Toe Fifth Change dressing three times week. Wound #39 Right Toe Fourth Change dressing three times week. Skin Barriers/Peri-Wound Care Barrier cream - to sacrum as needed Moisturizing lotion - both legs daily Wound Cleansing Wound #37 Right,Lateral Foot May shower and wash wound with soap and water. - with dressing changes Primary Wound Dressing Wound #37 Right,Lateral Foot Calcium Alginate with Silver - thin layer of gentamicin ointment on wound bed under alginate Wound #38 Right Toe Fifth Calcium Alginate with Silver - thin layer of gentamicin ointment on wound bed under alginate Wound #39 Right Toe Fourth Calcium Alginate with Silver - thin layer of  gentamicin ointment on wound bed under alginate Secondary Dressing Wound #37 Right,Lateral Foot Kerlix/Rolled Gauze Dry Gauze Wound #38 Right Toe Fifth Kerlix/Rolled Gauze Dry Gauze Wound #39 Right Toe Fourth Kerlix/Rolled Gauze Dry Gauze Edema Control Kerlix and Coban - Right Lower Extremity - wrap from base of toes to tibial tuberosity Elevate legs to the level of the heart or above for 30 minutes daily and/or when sitting, a frequency of: Support Garment 10-20 mm/Hg pressure to: - support hose both legs daily Off-Loading Multipodus Splint to: - sage boots both feet at all times. please float feet to offload pressure to lateral side of feet. Gel mattress overlay (Group 1) - for extended mattress/bed Turn and reposition every 2 hours Home Health Continue Home Health skilled nursing for wound care. Beverly Gust Electronic Signature(s) Signed: 08/04/2019 5:29:37 PM By: Lenda Kelp PA-C Signed: 08/04/2019 5:43:44 PM By: Zenaida Deed RN, BSN Entered By: Zenaida Deed on 08/04/2019 11:32:52 -------------------------------------------------------------------------------- Problem List Details Patient Name: Date of Service: [name], [name] 08/04/2019 10:45 AM Medical Record [id number] Patient Account Number: [id number] Date of Birth/Sex: Treating RN: December 07, 1960 (59 y.o. [name] Primary Care Provider: Orpah [name] Other Clinician: Referring Provider: Treating Provider/Extender:[name], Reginal Lutes, Orlie Pollen in Treatment: 107 Active Problems ICD-10 Evaluated Encounter Code Description Active Date Today Diagnosis L89.522 Pressure ulcer of left ankle, stage 2 04/28/2019 No Yes L89.892 Pressure ulcer of other site, stage 2 06/23/2019 No Yes L89.893 Pressure ulcer of other site, stage 3 06/23/2019 No Yes I73.89 Other specified peripheral vascular diseases 08/26/2018 No Yes Inactive Problems ICD-10 Code Description Active Date Inactive Date L97.511  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 07/14/2017 07/14/2017 breakdown of skin L03.116 Cellulitis of left lower limb 08/26/2018 08/26/2018 L89.312 Pressure ulcer of right buttock, stage 2 12/25/2017 12/25/2017 L03.115 Cellulitis of right lower limb 12/25/2017 12/25/2017 L89.152 Pressure ulcer of sacral region, stage 2 12/25/2017 12/25/2017 L89.322 Pressure ulcer of left buttock, stage 2 12/25/2017 12/25/2017 L03.116 Cellulitis of left lower limb 11/23/2018 11/23/2018 Resolved Problems ICD-10 Code Description Active Date Resolved Date L89.152 Pressure ulcer of sacral region, stage 2 03/31/2019 03/31/2019 L97.512 Non-pressure chronic ulcer of other  [name] on 08/04/2019 11:33:16 -------------------------------------------------------------------------------- Physician Orders Details Patient Name: Date of Service: [name], [name] 08/04/2019 10:45 AM Medical Record [id number] Patient Account Number: [id number] Date of Birth/Sex: Treating RN: 1960/12/09 (59 y.o. [name] Primary Care Provider: Orpah [name] Other Clinician: Referring Provider: Treating Provider/Extender:[name], Reginal Lutes, Orlie Pollen in Treatment: [id number] Verbal / Phone Orders: No Diagnosis Coding ICD-10 Coding Code Description [phone] Pressure ulcer of left ankle, stage 2 L89.892 Pressure ulcer of other site, stage 2 L89.893 Pressure ulcer of other site, stage 3 I73.89 Other specified peripheral vascular diseases Follow-up Appointments Return Appointment in 2 weeks. Dressing Change Frequency Wound #37 Right,Lateral Foot Change dressing three times week. Wound #38 Right Toe Fifth Change dressing three times week. Wound #39 Right Toe Fourth Change dressing three times week. Skin Barriers/Peri-Wound Care Barrier cream - to sacrum as needed Moisturizing lotion - both legs daily Wound Cleansing Wound #37 Right,Lateral Foot May shower and wash wound with soap and water. - with dressing changes Primary Wound Dressing Wound #37 Right,Lateral Foot Calcium Alginate with Silver - thin layer of gentamicin ointment on wound bed under alginate Wound #38 Right Toe Fifth Calcium Alginate with Silver - thin layer of gentamicin ointment on wound bed under alginate Wound #39 Right Toe Fourth Calcium Alginate with Silver - thin layer of  gentamicin ointment on wound bed under alginate Secondary Dressing Wound #37 Right,Lateral Foot Kerlix/Rolled Gauze Dry Gauze Wound #38 Right Toe Fifth Kerlix/Rolled Gauze Dry Gauze Wound #39 Right Toe Fourth Kerlix/Rolled Gauze Dry Gauze Edema Control Kerlix and Coban - Right Lower Extremity - wrap from base of toes to tibial tuberosity Elevate legs to the level of the heart or above for 30 minutes daily and/or when sitting, a frequency of: Support Garment 10-20 mm/Hg pressure to: - support hose both legs daily Off-Loading Multipodus Splint to: - sage boots both feet at all times. please float feet to offload pressure to lateral side of feet. Gel mattress overlay (Group 1) - for extended mattress/bed Turn and reposition every 2 hours Home Health Continue Home Health skilled nursing for wound care. Beverly Gust Electronic Signature(s) Signed: 08/04/2019 5:29:37 PM By: Lenda Kelp PA-C Signed: 08/04/2019 5:43:44 PM By: Zenaida Deed RN, BSN Entered By: Zenaida Deed on 08/04/2019 11:32:52 -------------------------------------------------------------------------------- Problem List Details Patient Name: Date of Service: [name], [name] 08/04/2019 10:45 AM Medical Record [id number] Patient Account Number: [id number] Date of Birth/Sex: Treating RN: December 07, 1960 (59 y.o. [name] Primary Care Provider: Orpah [name] Other Clinician: Referring Provider: Treating Provider/Extender:[name], Reginal Lutes, Orlie Pollen in Treatment: 107 Active Problems ICD-10 Evaluated Encounter Code Description Active Date Today Diagnosis L89.522 Pressure ulcer of left ankle, stage 2 04/28/2019 No Yes L89.892 Pressure ulcer of other site, stage 2 06/23/2019 No Yes L89.893 Pressure ulcer of other site, stage 3 06/23/2019 No Yes I73.89 Other specified peripheral vascular diseases 08/26/2018 No Yes Inactive Problems ICD-10 Code Description Active Date Inactive Date L97.511  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 07/14/2017 07/14/2017 breakdown of skin L03.116 Cellulitis of left lower limb 08/26/2018 08/26/2018 L89.312 Pressure ulcer of right buttock, stage 2 12/25/2017 12/25/2017 L03.115 Cellulitis of right lower limb 12/25/2017 12/25/2017 L89.152 Pressure ulcer of sacral region, stage 2 12/25/2017 12/25/2017 L89.322 Pressure ulcer of left buttock, stage 2 12/25/2017 12/25/2017 L03.116 Cellulitis of left lower limb 11/23/2018 11/23/2018 Resolved Problems ICD-10 Code Description Active Date Resolved Date L89.152 Pressure ulcer of sacral region, stage 2 03/31/2019 03/31/2019 L97.512 Non-pressure chronic ulcer of other  [name], [name] ([id number]) Visit Report for 08/04/2019 Chief Complaint Document Details Patient Name: Date of Service: [name], [name] 08/04/2019 10:45 AM Medical Record [id number] Patient Account Number: [id number] Date of Birth/Sex: Treating RN: [birth year] (59 y.o. [name] Primary Care Provider: Orpah [name] Other Clinician: Referring Provider: Treating Provider/Extender:[name], Reginal Lutes, Orlie Pollen in Treatment: 107 Information Obtained from: Patient Chief Complaint Patient is here for multiple wounds on his bilateral feet and left ankle. Electronic Signature(s) Signed: 08/04/2019 10:50:10 AM By: Lenda Kelp PA-C Entered By: Lenda Kelp on 08/04/2019 10:50:10 -------------------------------------------------------------------------------- HPI Details Patient Name: Date of Service: [name], [name] 08/04/2019 10:45 AM Medical Record [id number] Patient Account Number: [id number] Date of Birth/Sex: Treating RN: Nov 03, 1960 (59 y.o. [name] Primary Care Provider: Orpah [name] Other Clinician: Referring Provider: Treating Provider/Extender:[name], Reginal Lutes, Orlie Pollen in Treatment: 107 History of Present Illness HPI Description: 11/23/15; this is a patient I remember from a previous stay in this clinic in 2012. The patient says this was for a wound in this same area as currently although I have no information to verify that. Most of the information is provided by his caregiver from the group home where he resides. Apparently he has had a wound in this area for 8 months. He also has erythema around this area and has had multiple rounds of antibiotics apparently with some improvement but the erythema is persists. He apparently had a fracture in the ankle 2 months ago and was seen by Dr. Lajoyce Corners . He apparently had a splint applied and more recently a heel offloading boot. He had home health coming out to a week ago  not clear what they are dressing this with but they apparently discharged him perhaps because of one of Dr. 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 He has a Nurse, adult for transferring at home he is nonambulatory and does not wear footwear. He is not a diabetic. Looking through [facility] he appears to have fairly active primary care. The area on the ankle is variably labeled as a pressure area and/or chronic venous insufficiency. He had a recent venous ultrasound on 5/25 that did not show a DVT in the left leg however this was not a reflux study. There was no superficial DVT either. Arterial studies on 10/15/14 showed a left ABI of 1.16 Center right of 1.12 waveforms were triphasic he does not have significant arterial disease. He has had recent x-rays of the left foot and ankle. The left foot did not show any abnormality. Left ankle x-ray showed a spiral fracture of the left tibial diaphysis without evidence of osteomyelitis. 11/30/15 culture I did last week showed pseudomonas I changed him to oral ciprofloxacin. Erythema is a lot better. 12/07/15 area around the wound shows considerable improvement of the periwound erythema. 12/14/15; the patient has a improvement of the periwound erythema which in retrospect I think with cellulitis successfully treated. We have been using Iodoflex started last week 12/25/15 wound on the left medial malleolus and dorsal left foot. Theraskin #1 applied READMISSION 02/27/16; the patient was admitted to hospital from 8/14 through 01/18/16 initially felt to have cellulitis of his left lower leg however he was noted to have significant persistent pain and swelling. A duplex ultrasound was ordered that was negative. [name] an x-ray was done that showed a spiral fracture of the left leg. With posterior displacement and angulation at the mid left tibia. Orthopedics saw the patient and splinted  [name], [name] ([id number]) Visit Report for 08/04/2019 Chief Complaint Document Details Patient Name: Date of Service: [name], [name] 08/04/2019 10:45 AM Medical Record [id number] Patient Account Number: [id number] Date of Birth/Sex: Treating RN: [birth year] (59 y.o. [name] Primary Care Provider: Orpah [name] Other Clinician: Referring Provider: Treating Provider/Extender:[name], Reginal Lutes, Orlie Pollen in Treatment: 107 Information Obtained from: Patient Chief Complaint Patient is here for multiple wounds on his bilateral feet and left ankle. Electronic Signature(s) Signed: 08/04/2019 10:50:10 AM By: Lenda Kelp PA-C Entered By: Lenda Kelp on 08/04/2019 10:50:10 -------------------------------------------------------------------------------- HPI Details Patient Name: Date of Service: [name], [name] 08/04/2019 10:45 AM Medical Record [id number] Patient Account Number: [id number] Date of Birth/Sex: Treating RN: Nov 03, 1960 (59 y.o. [name] Primary Care Provider: Orpah [name] Other Clinician: Referring Provider: Treating Provider/Extender:[name], Reginal Lutes, Orlie Pollen in Treatment: 107 History of Present Illness HPI Description: 11/23/15; this is a patient I remember from a previous stay in this clinic in 2012. The patient says this was for a wound in this same area as currently although I have no information to verify that. Most of the information is provided by his caregiver from the group home where he resides. Apparently he has had a wound in this area for 8 months. He also has erythema around this area and has had multiple rounds of antibiotics apparently with some improvement but the erythema is persists. He apparently had a fracture in the ankle 2 months ago and was seen by Dr. Lajoyce Corners . He apparently had a splint applied and more recently a heel offloading boot. He had home health coming out to a week ago  not clear what they are dressing this with but they apparently discharged him perhaps because of one of Dr. 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 He has a Nurse, adult for transferring at home he is nonambulatory and does not wear footwear. He is not a diabetic. Looking through [facility] he appears to have fairly active primary care. The area on the ankle is variably labeled as a pressure area and/or chronic venous insufficiency. He had a recent venous ultrasound on 5/25 that did not show a DVT in the left leg however this was not a reflux study. There was no superficial DVT either. Arterial studies on 10/15/14 showed a left ABI of 1.16 Center right of 1.12 waveforms were triphasic he does not have significant arterial disease. He has had recent x-rays of the left foot and ankle. The left foot did not show any abnormality. Left ankle x-ray showed a spiral fracture of the left tibial diaphysis without evidence of osteomyelitis. 11/30/15 culture I did last week showed pseudomonas I changed him to oral ciprofloxacin. Erythema is a lot better. 12/07/15 area around the wound shows considerable improvement of the periwound erythema. 12/14/15; the patient has a improvement of the periwound erythema which in retrospect I think with cellulitis successfully treated. We have been using Iodoflex started last week 12/25/15 wound on the left medial malleolus and dorsal left foot. Theraskin #1 applied READMISSION 02/27/16; the patient was admitted to hospital from 8/14 through 01/18/16 initially felt to have cellulitis of his left lower leg however he was noted to have significant persistent pain and swelling. A duplex ultrasound was ordered that was negative. [name] an x-ray was done that showed a spiral fracture of the left leg. With posterior displacement and angulation at the mid left tibia. Orthopedics saw the patient and splinted  the exact timing. That is why he was not here for his last appointment. 07/07/2019 upon evaluation today patient actually appears to be doing quite well in regard to his left ankle as well as the chest/back region which is doing great in fact appears to be healed in my opinion. He still has an issue on his lateral right foot that is open and appears to be infected but does have me more concerned. In fact I do believe the doxycycline helped in general for the cellulitis but I believe that may have been more MRSA related based on the culture that we did. What I am seeing right now has more of the blue-green drainage and may be more consistent with a Pseudomonas that I was hoping was not really causing much of an issue but I think it may be at this point on the right lateral foot. For that reason I do think treatment would be a good idea of the reason I avoided the Cipro before was the potential for QT prolongation which could obviously cause problems with the patient with results of interaction with respiratory wound. The tizanidine also had some interactions as well. Nonetheless I think at this point that it would be a possibility for [place] to use topical gentamicin to see if this could be of benefit for the patient underneath the silver alginate dressing. 07/21/2019 upon evaluation today patient appears to be doing fairly well at this point in  regard to his lower extremity on the right. He does have a wound on his right lateral foot as well as the right fifth toe and the right fourth toe. Unfortunately he continues to experience significant issues with pressure and trauma to some degree and I think this is just due to the fact that again he is not really not able to control his legs and what is happening here. Fortunately there is no signs of active infection at this time. 08/04/2019 upon evaluation today patient actually appears to be doing quite well with regard to his toe ulcers I am very pleased in that regard. Unfortunately the foot ulcer is showing some signs of erythema his primary care provider has placed him on clindamycin at this point. Again I do believe this can be beneficial for him. With that being said I do think the patient may benefit from a light Kerlix and Coban wrap to help with some of his edema at this point. Electronic Signature(s) Signed: 08/04/2019 11:32:48 AM By: Lenda Kelp PA-C Entered By: Lenda Kelp on 08/04/2019 11:32:48 -------------------------------------------------------------------------------- Physical Exam Details Patient Name: Date of Service: [name], [name] 08/04/2019 10:45 AM Medical Record [id number] Patient Account Number: [id number] Date of Birth/Sex: Treating RN: Jul 18, 1960 (59 y.o. [name] Primary Care Provider: Orpah [name] Other Clinician: Referring Provider: Treating Provider/Extender:[name], Reginal Lutes, Orlie Pollen in Treatment: 107 Constitutional Well-nourished and well-hydrated in no acute distress. Respiratory normal breathing without difficulty. Psychiatric this patient is able to make decisions and demonstrates good insight into disease process. Alert and Oriented x 3. pleasant and cooperative. Notes Upon inspection patient's wound bed actually appears to be doing quite well with regard to the toes I am pleased in that regard. He  does have some slough buildup on the foot in particular although this also shows some signs of erythema he is currently on clindamycin. Fortunately there is no signs of active infection at this time systemically. Electronic Signature(s) Signed: 08/04/2019 11:33:16 AM By: Lenda Kelp PA-C Entered By: Linwood Dibbles,  [name] on 08/04/2019 11:33:16 -------------------------------------------------------------------------------- Physician Orders Details Patient Name: Date of Service: [name], [name] 08/04/2019 10:45 AM Medical Record [id number] Patient Account Number: [id number] Date of Birth/Sex: Treating RN: 1960/12/09 (59 y.o. [name] Primary Care Provider: Orpah [name] Other Clinician: Referring Provider: Treating Provider/Extender:[name], Reginal Lutes, Orlie Pollen in Treatment: [id number] Verbal / Phone Orders: No Diagnosis Coding ICD-10 Coding Code Description [phone] Pressure ulcer of left ankle, stage 2 L89.892 Pressure ulcer of other site, stage 2 L89.893 Pressure ulcer of other site, stage 3 I73.89 Other specified peripheral vascular diseases Follow-up Appointments Return Appointment in 2 weeks. Dressing Change Frequency Wound #37 Right,Lateral Foot Change dressing three times week. Wound #38 Right Toe Fifth Change dressing three times week. Wound #39 Right Toe Fourth Change dressing three times week. Skin Barriers/Peri-Wound Care Barrier cream - to sacrum as needed Moisturizing lotion - both legs daily Wound Cleansing Wound #37 Right,Lateral Foot May shower and wash wound with soap and water. - with dressing changes Primary Wound Dressing Wound #37 Right,Lateral Foot Calcium Alginate with Silver - thin layer of gentamicin ointment on wound bed under alginate Wound #38 Right Toe Fifth Calcium Alginate with Silver - thin layer of gentamicin ointment on wound bed under alginate Wound #39 Right Toe Fourth Calcium Alginate with Silver - thin layer of  gentamicin ointment on wound bed under alginate Secondary Dressing Wound #37 Right,Lateral Foot Kerlix/Rolled Gauze Dry Gauze Wound #38 Right Toe Fifth Kerlix/Rolled Gauze Dry Gauze Wound #39 Right Toe Fourth Kerlix/Rolled Gauze Dry Gauze Edema Control Kerlix and Coban - Right Lower Extremity - wrap from base of toes to tibial tuberosity Elevate legs to the level of the heart or above for 30 minutes daily and/or when sitting, a frequency of: Support Garment 10-20 mm/Hg pressure to: - support hose both legs daily Off-Loading Multipodus Splint to: - sage boots both feet at all times. please float feet to offload pressure to lateral side of feet. Gel mattress overlay (Group 1) - for extended mattress/bed Turn and reposition every 2 hours Home Health Continue Home Health skilled nursing for wound care. Beverly Gust Electronic Signature(s) Signed: 08/04/2019 5:29:37 PM By: Lenda Kelp PA-C Signed: 08/04/2019 5:43:44 PM By: Zenaida Deed RN, BSN Entered By: Zenaida Deed on 08/04/2019 11:32:52 -------------------------------------------------------------------------------- Problem List Details Patient Name: Date of Service: [name], [name] 08/04/2019 10:45 AM Medical Record [id number] Patient Account Number: [id number] Date of Birth/Sex: Treating RN: December 07, 1960 (59 y.o. [name] Primary Care Provider: Orpah [name] Other Clinician: Referring Provider: Treating Provider/Extender:[name], Reginal Lutes, Orlie Pollen in Treatment: 107 Active Problems ICD-10 Evaluated Encounter Code Description Active Date Today Diagnosis L89.522 Pressure ulcer of left ankle, stage 2 04/28/2019 No Yes L89.892 Pressure ulcer of other site, stage 2 06/23/2019 No Yes L89.893 Pressure ulcer of other site, stage 3 06/23/2019 No Yes I73.89 Other specified peripheral vascular diseases 08/26/2018 No Yes Inactive Problems ICD-10 Code Description Active Date Inactive Date L97.511  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 07/14/2017 07/14/2017 breakdown of skin L03.116 Cellulitis of left lower limb 08/26/2018 08/26/2018 L89.312 Pressure ulcer of right buttock, stage 2 12/25/2017 12/25/2017 L03.115 Cellulitis of right lower limb 12/25/2017 12/25/2017 L89.152 Pressure ulcer of sacral region, stage 2 12/25/2017 12/25/2017 L89.322 Pressure ulcer of left buttock, stage 2 12/25/2017 12/25/2017 L03.116 Cellulitis of left lower limb 11/23/2018 11/23/2018 Resolved Problems ICD-10 Code Description Active Date Resolved Date L89.152 Pressure ulcer of sacral region, stage 2 03/31/2019 03/31/2019 L97.512 Non-pressure chronic ulcer of other  part of right foot with fat 12/25/2017 12/25/2017 layer exposed L97.322 Non-pressure chronic ulcer of left 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 05/01/2018 05/01/2018 L97.521 Non-pressure chronic ulcer of other part of left foot limited to 11/10/2018 11/10/2018 breakdown of skin Electronic Signature(s) Signed: 08/04/2019 10:50:02 AM By: Lenda Kelp PA-C Entered By: Lenda Kelp on 08/04/2019 10:50:01 -------------------------------------------------------------------------------- Progress Note Details Patient Name: Date of Service: [name], [name] 08/04/2019 10:45 AM Medical Record [id number] Patient Account Number: [id number] Date of Birth/Sex: Treating RN: 04/15/1961 (59 y.o. [name] Primary Care Provider: Orpah [name] Other Clinician: Referring Provider: Treating Provider/Extender:[name], Reginal Lutes, Orlie Pollen in Treatment: 107 Subjective Chief Complaint Information obtained from Patient Patient is here for multiple wounds on his bilateral feet and left ankle. History of Present Illness (HPI) 11/23/15; this is a patient I remember from a previous stay in this clinic in 2012. The patient says this was for a wound in this same area as currently although I have no information to verify that. Most of the information  is provided by his caregiver from the group home where he resides. Apparently he has had a wound in this area for 8 months. He also has erythema around this area and has had multiple rounds of antibiotics apparently with some improvement but the erythema is persists. He apparently had a fracture in the ankle 2 months ago and was seen by Dr. Lajoyce Corners . He apparently had a splint applied and more recently a heel offloading boot. He had home health coming out to a week ago not clear what they are dressing this with but they apparently discharged him perhaps because of one of Dr. 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 He has a Nurse, adult for transferring at home he is nonambulatory and does not wear footwear. He is not a diabetic. Looking through [facility] he appears to have fairly active primary care. The area on the ankle is variably labeled as a pressure area and/or chronic venous insufficiency. He had a recent venous ultrasound on 5/25 that did not show a DVT in the left leg however this was not a reflux study. There was no superficial DVT either. Arterial studies on 10/15/14 showed a left ABI of 1.16 Center right of 1.12 waveforms were triphasic he does not have significant arterial disease. He has had recent x-rays of the left foot and ankle. The left foot did not show any abnormality. Left ankle x-ray showed a spiral fracture of the left tibial diaphysis without evidence of osteomyelitis. 11/30/15 culture I did last week showed pseudomonas I changed him to oral ciprofloxacin. Erythema is a lot better. 12/07/15 area around the wound shows considerable improvement of the periwound erythema. 12/14/15; the patient has a improvement of the periwound erythema which in retrospect I think with cellulitis successfully treated. We have been using Iodoflex started last week 12/25/15 wound on the left medial malleolus and dorsal left foot. Theraskin #1  applied READMISSION 02/27/16; the patient was admitted to hospital from 8/14 through 01/18/16 initially felt to have cellulitis of his left lower leg however he was noted to have significant persistent pain and swelling. A duplex ultrasound was ordered that was negative. [name] an x-ray was done that showed a spiral fracture of the left leg. With posterior displacement and angulation at the mid left tibia. Orthopedics saw the patient and splinted the leg. He was then sent to [facility] skilled facility and only return to his group home  the exact timing. That is why he was not here for his last appointment. 07/07/2019 upon evaluation today patient actually appears to be doing quite well in regard to his left ankle as well as the chest/back region which is doing great in fact appears to be healed in my opinion. He still has an issue on his lateral right foot that is open and appears to be infected but does have me more concerned. In fact I do believe the doxycycline helped in general for the cellulitis but I believe that may have been more MRSA related based on the culture that we did. What I am seeing right now has more of the blue-green drainage and may be more consistent with a Pseudomonas that I was hoping was not really causing much of an issue but I think it may be at this point on the right lateral foot. For that reason I do think treatment would be a good idea of the reason I avoided the Cipro before was the potential for QT prolongation which could obviously cause problems with the patient with results of interaction with respiratory wound. The tizanidine also had some interactions as well. Nonetheless I think at this point that it would be a possibility for [place] to use topical gentamicin to see if this could be of benefit for the patient underneath the silver alginate dressing. 07/21/2019 upon evaluation today patient appears to be doing fairly well at this point in  regard to his lower extremity on the right. He does have a wound on his right lateral foot as well as the right fifth toe and the right fourth toe. Unfortunately he continues to experience significant issues with pressure and trauma to some degree and I think this is just due to the fact that again he is not really not able to control his legs and what is happening here. Fortunately there is no signs of active infection at this time. 08/04/2019 upon evaluation today patient actually appears to be doing quite well with regard to his toe ulcers I am very pleased in that regard. Unfortunately the foot ulcer is showing some signs of erythema his primary care provider has placed him on clindamycin at this point. Again I do believe this can be beneficial for him. With that being said I do think the patient may benefit from a light Kerlix and Coban wrap to help with some of his edema at this point. Electronic Signature(s) Signed: 08/04/2019 11:32:48 AM By: Lenda Kelp PA-C Entered By: Lenda Kelp on 08/04/2019 11:32:48 -------------------------------------------------------------------------------- Physical Exam Details Patient Name: Date of Service: [name], [name] 08/04/2019 10:45 AM Medical Record [id number] Patient Account Number: [id number] Date of Birth/Sex: Treating RN: Jul 18, 1960 (59 y.o. [name] Primary Care Provider: Orpah [name] Other Clinician: Referring Provider: Treating Provider/Extender:[name], Reginal Lutes, Orlie Pollen in Treatment: 107 Constitutional Well-nourished and well-hydrated in no acute distress. Respiratory normal breathing without difficulty. Psychiatric this patient is able to make decisions and demonstrates good insight into disease process. Alert and Oriented x 3. pleasant and cooperative. Notes Upon inspection patient's wound bed actually appears to be doing quite well with regard to the toes I am pleased in that regard. He  does have some slough buildup on the foot in particular although this also shows some signs of erythema he is currently on clindamycin. Fortunately there is no signs of active infection at this time systemically. Electronic Signature(s) Signed: 08/04/2019 11:33:16 AM By: Lenda Kelp PA-C Entered By: Linwood Dibbles,  the exact timing. That is why he was not here for his last appointment. 07/07/2019 upon evaluation today patient actually appears to be doing quite well in regard to his left ankle as well as the chest/back region which is doing great in fact appears to be healed in my opinion. He still has an issue on his lateral right foot that is open and appears to be infected but does have me more concerned. In fact I do believe the doxycycline helped in general for the cellulitis but I believe that may have been more MRSA related based on the culture that we did. What I am seeing right now has more of the blue-green drainage and may be more consistent with a Pseudomonas that I was hoping was not really causing much of an issue but I think it may be at this point on the right lateral foot. For that reason I do think treatment would be a good idea of the reason I avoided the Cipro before was the potential for QT prolongation which could obviously cause problems with the patient with results of interaction with respiratory wound. The tizanidine also had some interactions as well. Nonetheless I think at this point that it would be a possibility for [place] to use topical gentamicin to see if this could be of benefit for the patient underneath the silver alginate dressing. 07/21/2019 upon evaluation today patient appears to be doing fairly well at this point in  regard to his lower extremity on the right. He does have a wound on his right lateral foot as well as the right fifth toe and the right fourth toe. Unfortunately he continues to experience significant issues with pressure and trauma to some degree and I think this is just due to the fact that again he is not really not able to control his legs and what is happening here. Fortunately there is no signs of active infection at this time. 08/04/2019 upon evaluation today patient actually appears to be doing quite well with regard to his toe ulcers I am very pleased in that regard. Unfortunately the foot ulcer is showing some signs of erythema his primary care provider has placed him on clindamycin at this point. Again I do believe this can be beneficial for him. With that being said I do think the patient may benefit from a light Kerlix and Coban wrap to help with some of his edema at this point. Electronic Signature(s) Signed: 08/04/2019 11:32:48 AM By: Lenda Kelp PA-C Entered By: Lenda Kelp on 08/04/2019 11:32:48 -------------------------------------------------------------------------------- Physical Exam Details Patient Name: Date of Service: [name], [name] 08/04/2019 10:45 AM Medical Record [id number] Patient Account Number: [id number] Date of Birth/Sex: Treating RN: Jul 18, 1960 (59 y.o. [name] Primary Care Provider: Orpah [name] Other Clinician: Referring Provider: Treating Provider/Extender:[name], Reginal Lutes, Orlie Pollen in Treatment: 107 Constitutional Well-nourished and well-hydrated in no acute distress. Respiratory normal breathing without difficulty. Psychiatric this patient is able to make decisions and demonstrates good insight into disease process. Alert and Oriented x 3. pleasant and cooperative. Notes Upon inspection patient's wound bed actually appears to be doing quite well with regard to the toes I am pleased in that regard. He  does have some slough buildup on the foot in particular although this also shows some signs of erythema he is currently on clindamycin. Fortunately there is no signs of active infection at this time systemically. Electronic Signature(s) Signed: 08/04/2019 11:33:16 AM By: Lenda Kelp PA-C Entered By: Linwood Dibbles,  part of right foot with fat 12/25/2017 12/25/2017 layer exposed L97.322 Non-pressure chronic ulcer of left 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 05/01/2018 05/01/2018 L97.521 Non-pressure chronic ulcer of other part of left foot limited to 11/10/2018 11/10/2018 breakdown of skin Electronic Signature(s) Signed: 08/04/2019 10:50:02 AM By: Lenda Kelp PA-C Entered By: Lenda Kelp on 08/04/2019 10:50:01 -------------------------------------------------------------------------------- Progress Note Details Patient Name: Date of Service: [name], [name] 08/04/2019 10:45 AM Medical Record [id number] Patient Account Number: [id number] Date of Birth/Sex: Treating RN: 04/15/1961 (59 y.o. [name] Primary Care Provider: Orpah [name] Other Clinician: Referring Provider: Treating Provider/Extender:[name], Reginal Lutes, Orlie Pollen in Treatment: 107 Subjective Chief Complaint Information obtained from Patient Patient is here for multiple wounds on his bilateral feet and left ankle. History of Present Illness (HPI) 11/23/15; this is a patient I remember from a previous stay in this clinic in 2012. The patient says this was for a wound in this same area as currently although I have no information to verify that. Most of the information  is provided by his caregiver from the group home where he resides. Apparently he has had a wound in this area for 8 months. He also has erythema around this area and has had multiple rounds of antibiotics apparently with some improvement but the erythema is persists. He apparently had a fracture in the ankle 2 months ago and was seen by Dr. Lajoyce Corners . He apparently had a splint applied and more recently a heel offloading boot. He had home health coming out to a week ago not clear what they are dressing this with but they apparently discharged him perhaps because of one of Dr. 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 He has a Nurse, adult for transferring at home he is nonambulatory and does not wear footwear. He is not a diabetic. Looking through [facility] he appears to have fairly active primary care. The area on the ankle is variably labeled as a pressure area and/or chronic venous insufficiency. He had a recent venous ultrasound on 5/25 that did not show a DVT in the left leg however this was not a reflux study. There was no superficial DVT either. Arterial studies on 10/15/14 showed a left ABI of 1.16 Center right of 1.12 waveforms were triphasic he does not have significant arterial disease. He has had recent x-rays of the left foot and ankle. The left foot did not show any abnormality. Left ankle x-ray showed a spiral fracture of the left tibial diaphysis without evidence of osteomyelitis. 11/30/15 culture I did last week showed pseudomonas I changed him to oral ciprofloxacin. Erythema is a lot better. 12/07/15 area around the wound shows considerable improvement of the periwound erythema. 12/14/15; the patient has a improvement of the periwound erythema which in retrospect I think with cellulitis successfully treated. We have been using Iodoflex started last week 12/25/15 wound on the left medial malleolus and dorsal left foot. Theraskin #1  applied READMISSION 02/27/16; the patient was admitted to hospital from 8/14 through 01/18/16 initially felt to have cellulitis of his left lower leg however he was noted to have significant persistent pain and swelling. A duplex ultrasound was ordered that was negative. [name] an x-ray was done that showed a spiral fracture of the left leg. With posterior displacement and angulation at the mid left tibia. Orthopedics saw the patient and splinted the leg. He was then sent to [facility] skilled facility and only return to his group home  part of right foot with fat 12/25/2017 12/25/2017 layer exposed L97.322 Non-pressure chronic ulcer of left 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 05/01/2018 05/01/2018 L97.521 Non-pressure chronic ulcer of other part of left foot limited to 11/10/2018 11/10/2018 breakdown of skin Electronic Signature(s) Signed: 08/04/2019 10:50:02 AM By: Lenda Kelp PA-C Entered By: Lenda Kelp on 08/04/2019 10:50:01 -------------------------------------------------------------------------------- Progress Note Details Patient Name: Date of Service: [name], [name] 08/04/2019 10:45 AM Medical Record [id number] Patient Account Number: [id number] Date of Birth/Sex: Treating RN: 04/15/1961 (59 y.o. [name] Primary Care Provider: Orpah [name] Other Clinician: Referring Provider: Treating Provider/Extender:[name], Reginal Lutes, Orlie Pollen in Treatment: 107 Subjective Chief Complaint Information obtained from Patient Patient is here for multiple wounds on his bilateral feet and left ankle. History of Present Illness (HPI) 11/23/15; this is a patient I remember from a previous stay in this clinic in 2012. The patient says this was for a wound in this same area as currently although I have no information to verify that. Most of the information  is provided by his caregiver from the group home where he resides. Apparently he has had a wound in this area for 8 months. He also has erythema around this area and has had multiple rounds of antibiotics apparently with some improvement but the erythema is persists. He apparently had a fracture in the ankle 2 months ago and was seen by Dr. Lajoyce Corners . He apparently had a splint applied and more recently a heel offloading boot. He had home health coming out to a week ago not clear what they are dressing this with but they apparently discharged him perhaps because of one of Dr. 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 He has a Nurse, adult for transferring at home he is nonambulatory and does not wear footwear. He is not a diabetic. Looking through [facility] he appears to have fairly active primary care. The area on the ankle is variably labeled as a pressure area and/or chronic venous insufficiency. He had a recent venous ultrasound on 5/25 that did not show a DVT in the left leg however this was not a reflux study. There was no superficial DVT either. Arterial studies on 10/15/14 showed a left ABI of 1.16 Center right of 1.12 waveforms were triphasic he does not have significant arterial disease. He has had recent x-rays of the left foot and ankle. The left foot did not show any abnormality. Left ankle x-ray showed a spiral fracture of the left tibial diaphysis without evidence of osteomyelitis. 11/30/15 culture I did last week showed pseudomonas I changed him to oral ciprofloxacin. Erythema is a lot better. 12/07/15 area around the wound shows considerable improvement of the periwound erythema. 12/14/15; the patient has a improvement of the periwound erythema which in retrospect I think with cellulitis successfully treated. We have been using Iodoflex started last week 12/25/15 wound on the left medial malleolus and dorsal left foot. Theraskin #1  applied READMISSION 02/27/16; the patient was admitted to hospital from 8/14 through 01/18/16 initially felt to have cellulitis of his left lower leg however he was noted to have significant persistent pain and swelling. A duplex ultrasound was ordered that was negative. [name] an x-ray was done that showed a spiral fracture of the left leg. With posterior displacement and angulation at the mid left tibia. Orthopedics saw the patient and splinted the leg. He was then sent to [facility] skilled facility and only return to his group home  [name] on 08/04/2019 11:33:16 -------------------------------------------------------------------------------- Physician Orders Details Patient Name: Date of Service: [name], [name] 08/04/2019 10:45 AM Medical Record [id number] Patient Account Number: [id number] Date of Birth/Sex: Treating RN: 1960/12/09 (59 y.o. [name] Primary Care Provider: Orpah [name] Other Clinician: Referring Provider: Treating Provider/Extender:[name], Reginal Lutes, Orlie Pollen in Treatment: [id number] Verbal / Phone Orders: No Diagnosis Coding ICD-10 Coding Code Description [phone] Pressure ulcer of left ankle, stage 2 L89.892 Pressure ulcer of other site, stage 2 L89.893 Pressure ulcer of other site, stage 3 I73.89 Other specified peripheral vascular diseases Follow-up Appointments Return Appointment in 2 weeks. Dressing Change Frequency Wound #37 Right,Lateral Foot Change dressing three times week. Wound #38 Right Toe Fifth Change dressing three times week. Wound #39 Right Toe Fourth Change dressing three times week. Skin Barriers/Peri-Wound Care Barrier cream - to sacrum as needed Moisturizing lotion - both legs daily Wound Cleansing Wound #37 Right,Lateral Foot May shower and wash wound with soap and water. - with dressing changes Primary Wound Dressing Wound #37 Right,Lateral Foot Calcium Alginate with Silver - thin layer of gentamicin ointment on wound bed under alginate Wound #38 Right Toe Fifth Calcium Alginate with Silver - thin layer of gentamicin ointment on wound bed under alginate Wound #39 Right Toe Fourth Calcium Alginate with Silver - thin layer of  gentamicin ointment on wound bed under alginate Secondary Dressing Wound #37 Right,Lateral Foot Kerlix/Rolled Gauze Dry Gauze Wound #38 Right Toe Fifth Kerlix/Rolled Gauze Dry Gauze Wound #39 Right Toe Fourth Kerlix/Rolled Gauze Dry Gauze Edema Control Kerlix and Coban - Right Lower Extremity - wrap from base of toes to tibial tuberosity Elevate legs to the level of the heart or above for 30 minutes daily and/or when sitting, a frequency of: Support Garment 10-20 mm/Hg pressure to: - support hose both legs daily Off-Loading Multipodus Splint to: - sage boots both feet at all times. please float feet to offload pressure to lateral side of feet. Gel mattress overlay (Group 1) - for extended mattress/bed Turn and reposition every 2 hours Home Health Continue Home Health skilled nursing for wound care. Beverly Gust Electronic Signature(s) Signed: 08/04/2019 5:29:37 PM By: Lenda Kelp PA-C Signed: 08/04/2019 5:43:44 PM By: Zenaida Deed RN, BSN Entered By: Zenaida Deed on 08/04/2019 11:32:52 -------------------------------------------------------------------------------- Problem List Details Patient Name: Date of Service: [name], [name] 08/04/2019 10:45 AM Medical Record [id number] Patient Account Number: [id number] Date of Birth/Sex: Treating RN: December 07, 1960 (59 y.o. [name] Primary Care Provider: Orpah [name] Other Clinician: Referring Provider: Treating Provider/Extender:[name], Reginal Lutes, Orlie Pollen in Treatment: 107 Active Problems ICD-10 Evaluated Encounter Code Description Active Date Today Diagnosis L89.522 Pressure ulcer of left ankle, stage 2 04/28/2019 No Yes L89.892 Pressure ulcer of other site, stage 2 06/23/2019 No Yes L89.893 Pressure ulcer of other site, stage 3 06/23/2019 No Yes I73.89 Other specified peripheral vascular diseases 08/26/2018 No Yes Inactive Problems ICD-10 Code Description Active Date Inactive Date L97.511  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 07/14/2017 07/14/2017 breakdown of skin L03.116 Cellulitis of left lower limb 08/26/2018 08/26/2018 L89.312 Pressure ulcer of right buttock, stage 2 12/25/2017 12/25/2017 L03.115 Cellulitis of right lower limb 12/25/2017 12/25/2017 L89.152 Pressure ulcer of sacral region, stage 2 12/25/2017 12/25/2017 L89.322 Pressure ulcer of left buttock, stage 2 12/25/2017 12/25/2017 L03.116 Cellulitis of left lower limb 11/23/2018 11/23/2018 Resolved Problems ICD-10 Code Description Active Date Resolved Date L89.152 Pressure ulcer of sacral region, stage 2 03/31/2019 03/31/2019 L97.512 Non-pressure chronic ulcer of other

## 2019-08-04 NOTE — Progress Notes (Addendum)
ByMikeal Hawthorne on 08/06/2019 14:55:23 -------------------------------------------------------------------------------- Wound Assessment Details Patient Name: Date of Service: [name], [name] 08/04/2019 10:45 AM Medical Record [id number] Patient Account Number: [id number] Date of Birth/Sex: Treating RN: Apr 10, 1961 (59 y.o. [name] Primary Care Mignon Bechler: [name] Other Clinician: Referring Tarquin Welcher: Treating Alphus Zeck/Extender:Stone III, Danny Lawless, Randon Goldsmith in Treatment: 107 Wound Status Wound Number: [id number] Primary Pressure Ulcer Etiology: Wound Location: Right Toe Fifth Wound Open Wounding Event: Shear/Friction Status: Date Acquired: 07/21/2019 Comorbid Deep Vein Thrombosis, Colitis, Weeks Of Treatment: 2 History: Osteomyelitis, Neuropathy, Quadriplegia, Clustered Wound: No Confinement Anxiety Photos Wound Measurements Length: (cm) 0.4 % Reduction i Width: (cm) 0.7 % Reduction i Depth: (cm) 0.2 Epithelializa Area: (cm) 0.22 Tunneling: Volume: (cm) 0.044 Undermining: Wound Description Classification: Category/Stage II Foul Odor Af Wound Margin: Distinct, outline attached Slough/Fibri Exudate Amount: Medium Exudate Type: Serosanguineous Exudate Color: red, brown Wound Bed Granulation Amount: Large (67-100%) Granulation Quality: Red Fascia Expose Necrotic Amount: None Present (0%) Fat Layer (Su Tendon Expose Muscle Expose Joint Exposed Bone Exposed: ter Cleansing: No no No Exposed Structure d: No bcutaneous Tissue) Exposed: No d: No d: No : No No n Area: 50% n Volume: 50% tion: Medium (34-66%) No No Treatment Notes Wound #38 (Right Toe Fifth) 1. Cleanse With Wound Cleanser Soap and water 3. Primary Dressing Applied Calcium Alginate Ag 4. Secondary Dressing Dry Gauze 6. Support Layer Holiday representative) Signed: 08/06/2019 4:47:30 PM By: Mikeal Hawthorne EMT/HBOT Signed: 08/06/2019 5:31:10 PM By: Baruch Gouty RN, BSN Previous Signature: 08/04/2019 5:34:34 PM Version By: Deon Pilling Entered By: Mikeal Hawthorne on 08/06/2019 14:55:43 -------------------------------------------------------------------------------- Wound Assessment Details Patient Name: Date of Service: [name], [name] 08/04/2019 10:45 AM Medical Record [id number] Patient Account Number:  [id number] Date of Birth/Sex: Treating RN: Dec 13, 1960 (59 y.o. [name] Primary Care Amatullah Christy: [name] Other Clinician: Referring Kristi Norment: Treating Ormand Senn/Extender:Stone III, Danny Lawless, Randon Goldsmith in Treatment: 107 Wound Status Wound Number: 39 Primary Abrasion Etiology: Wound Location: Right Toe Fourth Wound Open Wounding Event: Trauma Status: Date Acquired: 07/21/2019 Comorbid Deep Vein Thrombosis, Colitis, Weeks Of Treatment: 2 History: Osteomyelitis, Neuropathy, Quadriplegia, Clustered Wound: No Confinement Anxiety Photos Wound Measurements Length: (cm) 0.2 % Reductio Width: (cm) 0.2 % Reductio Depth: (cm) 0.2 Epithelial Area: (cm) 0.031 Tunneling Volume: (cm) 0.006 Undermini Wound Description Full Thickness Without Exposed Support Foul Classification: Structures Sloug Wound Flat and Intact Margin: Exudate Small Amount: Exudate Serosanguineous Type: Exudate red, brown Color: Wound Bed Granulation Amount: Medium (34-66%) Granulation Quality: Pink Fascia Necrotic Amount: Medium (34-66%) Fat La Necrotic Quality: Eschar Tendon Muscle Joint Bone E Odor After Cleansing: No h/Fibrino Yes Exposed Structure Exposed: No yer (Subcutaneous Tissue) Exposed: Yes Exposed: No Exposed: No Exposed: No xposed: No n in Area: 67% n in Volume: 33.3% ization: Medium (34-66%) : No ng: No Treatment Notes Wound #39 (Right Toe Fourth) 1. Cleanse With Wound Cleanser Soap and water 3. Primary Dressing Applied Calcium Alginate Ag 4. Secondary Dressing Dry Gauze 6. Support Layer Holiday representative) Signed: 08/06/2019 4:47:30 PM By: Mikeal Hawthorne EMT/HBOT Signed: 08/06/2019 5:31:10 PM By: Baruch Gouty RN, BSN Previous Signature: 08/04/2019 5:34:34 PM Version By: Deon Pilling Entered By: Mikeal Hawthorne on 08/06/2019 14:56:48 -------------------------------------------------------------------------------- Vitals  Details Patient Name: Date of Service: [name], [name] 08/04/2019 10:45 AM Medical Record [id number] Patient Account Number: [id number] Date of Birth/Sex: Treating RN: 10-09-1960 (59 y.o. [name] Primary Care Homer Pfeifer: [name] Other Clinician: Referring Chariti Havel: Treating Jazlyne Gauger/Extender:Stone III, Danny Lawless, Randon Goldsmith in Treatment:  RANARD, HARTE ([id number]) Visit Report for 08/04/2019 Arrival Information Details Patient Name: Date of Service: BIRNEY, BELSHE 08/04/2019 10:45 AM Medical Record [id number] Patient Account Number: [id number] Date of Birth/Sex: Treating RN: Feb 29, 1961 (59 y.o. [name] Primary Care Caroleena Paolini: [name] Other Clinician: Referring Jabbar Palmero: Treating Katelyn Kohlmeyer/Extender:Stone III, Danny Lawless, Randon Goldsmith in Treatment: 107 Visit Information History Since Last Visit Added or deleted any medications: Yes Patient Arrived: Wheel Chair Any new allergies or adverse reactions: No Arrival Time: 10:52 Had a fall or experienced change in No Accompanied By: self activities of daily living that may affect Transfer Assistance: None risk of falls: Patient Identification Verified: Yes Signs or symptoms of abuse/neglect since last No Secondary Verification Process Yes visito Completed: Hospitalized since last visit: No Patient Requires Transmission- No Implantable device outside of the clinic excluding No Based Precautions: cellular tissue based products placed in the center Patient Has Alerts: Yes since last visit: Patient Alerts: R ABI 1.14, L Has Dressing in Place as Prescribed: Yes ABI 1.16 Pain Present Now: No Electronic Signature(s) Signed: 08/04/2019 5:34:34 PM By: Deon Pilling Entered By: Deon Pilling on 08/04/2019 10:56:01 -------------------------------------------------------------------------------- Clinic Level of Care Assessment Details Patient Name: Date of Service: TREYSEAN, PETRUZZI 08/04/2019 10:45 AM Medical Record [id number] Patient Account Number: [id number] Date of Birth/Sex: Treating RN: 12-20-1960 (59 y.o. [name] Primary Care Brandie Lopes: [name] Other Clinician: Referring Airyonna Franklyn: Treating Kamika Goodloe/Extender:Stone III, Danny Lawless, Randon Goldsmith in Treatment: 107 Clinic Level of Care Assessment Items TOOL 4  Quantity Score []  - Use when only an EandM is performed on FOLLOW-UP visit 0 ASSESSMENTS - Nursing Assessment / Reassessment X - Reassessment of Co-morbidities (includes updates in patient status) 1 10 X - Reassessment of Adherence to Treatment Plan 1 5 ASSESSMENTS - Wound and Skin Assessment / Reassessment []  - Simple Wound Assessment / Reassessment - one wound 0 X - Complex Wound Assessment / Reassessment - multiple wounds 3 5 []  - Dermatologic / Skin Assessment (not related to wound area) 0 ASSESSMENTS - Focused Assessment []  - Circumferential Edema Measurements - multi extremities 0 []  - Nutritional Assessment / Counseling / Intervention 0 X - Lower Extremity Assessment (monofilament, tuning fork, pulses) 1 5 []  - Peripheral Arterial Disease Assessment (using hand held doppler) 0 ASSESSMENTS - Ostomy and/or Continence Assessment and Care []  - Incontinence Assessment and Management 0 []  - Ostomy Care Assessment and Management (repouching, etc.) 0 PROCESS - Coordination of Care X - Simple Patient / Family Education for ongoing care 1 15 []  - Complex (extensive) Patient / Family Education for ongoing care 0 X - Staff obtains Programmer, systems, Records, Test Results / Process Orders 1 10 X - Staff telephones HHA, Nursing Homes / Clarify orders / etc 1 10 []  - Routine Transfer to another Facility (non-emergent condition) 0 []  - Routine Hospital Admission (non-emergent condition) 0 []  - New Admissions / Biomedical engineer / Ordering NPWT, Apligraf, etc. 0 []  - Emergency Hospital Admission (emergent condition) 0 X - Simple Discharge Coordination 1 10 []  - Complex (extensive) Discharge Coordination 0 PROCESS - Special Needs []  - Pediatric / Minor Patient Management 0 []  - Isolation Patient Management 0 []  - Hearing / Language / Visual special needs 0 []  - Assessment of Community assistance (transportation, D/C planning, etc.) 0 []  - Additional assistance / Altered mentation 0 []  - Support  Surface(s) Assessment (bed, cushion, seat, etc.) 0 INTERVENTIONS - Wound Cleansing / Measurement []  - Simple Wound Cleansing - one wound 0 X - Complex  RANARD, HARTE ([id number]) Visit Report for 08/04/2019 Arrival Information Details Patient Name: Date of Service: BIRNEY, BELSHE 08/04/2019 10:45 AM Medical Record [id number] Patient Account Number: [id number] Date of Birth/Sex: Treating RN: Feb 29, 1961 (59 y.o. [name] Primary Care Caroleena Paolini: [name] Other Clinician: Referring Jabbar Palmero: Treating Katelyn Kohlmeyer/Extender:Stone III, Danny Lawless, Randon Goldsmith in Treatment: 107 Visit Information History Since Last Visit Added or deleted any medications: Yes Patient Arrived: Wheel Chair Any new allergies or adverse reactions: No Arrival Time: 10:52 Had a fall or experienced change in No Accompanied By: self activities of daily living that may affect Transfer Assistance: None risk of falls: Patient Identification Verified: Yes Signs or symptoms of abuse/neglect since last No Secondary Verification Process Yes visito Completed: Hospitalized since last visit: No Patient Requires Transmission- No Implantable device outside of the clinic excluding No Based Precautions: cellular tissue based products placed in the center Patient Has Alerts: Yes since last visit: Patient Alerts: R ABI 1.14, L Has Dressing in Place as Prescribed: Yes ABI 1.16 Pain Present Now: No Electronic Signature(s) Signed: 08/04/2019 5:34:34 PM By: Deon Pilling Entered By: Deon Pilling on 08/04/2019 10:56:01 -------------------------------------------------------------------------------- Clinic Level of Care Assessment Details Patient Name: Date of Service: TREYSEAN, PETRUZZI 08/04/2019 10:45 AM Medical Record [id number] Patient Account Number: [id number] Date of Birth/Sex: Treating RN: 12-20-1960 (59 y.o. [name] Primary Care Brandie Lopes: [name] Other Clinician: Referring Airyonna Franklyn: Treating Kamika Goodloe/Extender:Stone III, Danny Lawless, Randon Goldsmith in Treatment: 107 Clinic Level of Care Assessment Items TOOL 4  Quantity Score []  - Use when only an EandM is performed on FOLLOW-UP visit 0 ASSESSMENTS - Nursing Assessment / Reassessment X - Reassessment of Co-morbidities (includes updates in patient status) 1 10 X - Reassessment of Adherence to Treatment Plan 1 5 ASSESSMENTS - Wound and Skin Assessment / Reassessment []  - Simple Wound Assessment / Reassessment - one wound 0 X - Complex Wound Assessment / Reassessment - multiple wounds 3 5 []  - Dermatologic / Skin Assessment (not related to wound area) 0 ASSESSMENTS - Focused Assessment []  - Circumferential Edema Measurements - multi extremities 0 []  - Nutritional Assessment / Counseling / Intervention 0 X - Lower Extremity Assessment (monofilament, tuning fork, pulses) 1 5 []  - Peripheral Arterial Disease Assessment (using hand held doppler) 0 ASSESSMENTS - Ostomy and/or Continence Assessment and Care []  - Incontinence Assessment and Management 0 []  - Ostomy Care Assessment and Management (repouching, etc.) 0 PROCESS - Coordination of Care X - Simple Patient / Family Education for ongoing care 1 15 []  - Complex (extensive) Patient / Family Education for ongoing care 0 X - Staff obtains Programmer, systems, Records, Test Results / Process Orders 1 10 X - Staff telephones HHA, Nursing Homes / Clarify orders / etc 1 10 []  - Routine Transfer to another Facility (non-emergent condition) 0 []  - Routine Hospital Admission (non-emergent condition) 0 []  - New Admissions / Biomedical engineer / Ordering NPWT, Apligraf, etc. 0 []  - Emergency Hospital Admission (emergent condition) 0 X - Simple Discharge Coordination 1 10 []  - Complex (extensive) Discharge Coordination 0 PROCESS - Special Needs []  - Pediatric / Minor Patient Management 0 []  - Isolation Patient Management 0 []  - Hearing / Language / Visual special needs 0 []  - Assessment of Community assistance (transportation, D/C planning, etc.) 0 []  - Additional assistance / Altered mentation 0 []  - Support  Surface(s) Assessment (bed, cushion, seat, etc.) 0 INTERVENTIONS - Wound Cleansing / Measurement []  - Simple Wound Cleansing - one wound 0 X - Complex  RANARD, HARTE ([id number]) Visit Report for 08/04/2019 Arrival Information Details Patient Name: Date of Service: BIRNEY, BELSHE 08/04/2019 10:45 AM Medical Record [id number] Patient Account Number: [id number] Date of Birth/Sex: Treating RN: Feb 29, 1961 (59 y.o. [name] Primary Care Caroleena Paolini: [name] Other Clinician: Referring Jabbar Palmero: Treating Katelyn Kohlmeyer/Extender:Stone III, Danny Lawless, Randon Goldsmith in Treatment: 107 Visit Information History Since Last Visit Added or deleted any medications: Yes Patient Arrived: Wheel Chair Any new allergies or adverse reactions: No Arrival Time: 10:52 Had a fall or experienced change in No Accompanied By: self activities of daily living that may affect Transfer Assistance: None risk of falls: Patient Identification Verified: Yes Signs or symptoms of abuse/neglect since last No Secondary Verification Process Yes visito Completed: Hospitalized since last visit: No Patient Requires Transmission- No Implantable device outside of the clinic excluding No Based Precautions: cellular tissue based products placed in the center Patient Has Alerts: Yes since last visit: Patient Alerts: R ABI 1.14, L Has Dressing in Place as Prescribed: Yes ABI 1.16 Pain Present Now: No Electronic Signature(s) Signed: 08/04/2019 5:34:34 PM By: Deon Pilling Entered By: Deon Pilling on 08/04/2019 10:56:01 -------------------------------------------------------------------------------- Clinic Level of Care Assessment Details Patient Name: Date of Service: TREYSEAN, PETRUZZI 08/04/2019 10:45 AM Medical Record [id number] Patient Account Number: [id number] Date of Birth/Sex: Treating RN: 12-20-1960 (59 y.o. [name] Primary Care Brandie Lopes: [name] Other Clinician: Referring Airyonna Franklyn: Treating Kamika Goodloe/Extender:Stone III, Danny Lawless, Randon Goldsmith in Treatment: 107 Clinic Level of Care Assessment Items TOOL 4  Quantity Score []  - Use when only an EandM is performed on FOLLOW-UP visit 0 ASSESSMENTS - Nursing Assessment / Reassessment X - Reassessment of Co-morbidities (includes updates in patient status) 1 10 X - Reassessment of Adherence to Treatment Plan 1 5 ASSESSMENTS - Wound and Skin Assessment / Reassessment []  - Simple Wound Assessment / Reassessment - one wound 0 X - Complex Wound Assessment / Reassessment - multiple wounds 3 5 []  - Dermatologic / Skin Assessment (not related to wound area) 0 ASSESSMENTS - Focused Assessment []  - Circumferential Edema Measurements - multi extremities 0 []  - Nutritional Assessment / Counseling / Intervention 0 X - Lower Extremity Assessment (monofilament, tuning fork, pulses) 1 5 []  - Peripheral Arterial Disease Assessment (using hand held doppler) 0 ASSESSMENTS - Ostomy and/or Continence Assessment and Care []  - Incontinence Assessment and Management 0 []  - Ostomy Care Assessment and Management (repouching, etc.) 0 PROCESS - Coordination of Care X - Simple Patient / Family Education for ongoing care 1 15 []  - Complex (extensive) Patient / Family Education for ongoing care 0 X - Staff obtains Programmer, systems, Records, Test Results / Process Orders 1 10 X - Staff telephones HHA, Nursing Homes / Clarify orders / etc 1 10 []  - Routine Transfer to another Facility (non-emergent condition) 0 []  - Routine Hospital Admission (non-emergent condition) 0 []  - New Admissions / Biomedical engineer / Ordering NPWT, Apligraf, etc. 0 []  - Emergency Hospital Admission (emergent condition) 0 X - Simple Discharge Coordination 1 10 []  - Complex (extensive) Discharge Coordination 0 PROCESS - Special Needs []  - Pediatric / Minor Patient Management 0 []  - Isolation Patient Management 0 []  - Hearing / Language / Visual special needs 0 []  - Assessment of Community assistance (transportation, D/C planning, etc.) 0 []  - Additional assistance / Altered mentation 0 []  - Support  Surface(s) Assessment (bed, cushion, seat, etc.) 0 INTERVENTIONS - Wound Cleansing / Measurement []  - Simple Wound Cleansing - one wound 0 X - Complex  ByMikeal Hawthorne on 08/06/2019 14:55:23 -------------------------------------------------------------------------------- Wound Assessment Details Patient Name: Date of Service: [name], [name] 08/04/2019 10:45 AM Medical Record [id number] Patient Account Number: [id number] Date of Birth/Sex: Treating RN: Apr 10, 1961 (59 y.o. [name] Primary Care Mignon Bechler: [name] Other Clinician: Referring Tarquin Welcher: Treating Alphus Zeck/Extender:Stone III, Danny Lawless, Randon Goldsmith in Treatment: 107 Wound Status Wound Number: 38 Primary Pressure Ulcer Etiology: Wound Location: Right Toe Fifth Wound Open Wounding Event: Shear/Friction Status: Date Acquired: 07/21/2019 Comorbid Deep Vein Thrombosis, Colitis, Weeks Of Treatment: 2 History: Osteomyelitis, Neuropathy, Quadriplegia, Clustered Wound: No Confinement Anxiety Photos Wound Measurements Length: (cm) 0.4 % Reduction i Width: (cm) 0.7 % Reduction i Depth: (cm) 0.2 Epithelializa Area: (cm) 0.22 Tunneling: Volume: (cm) 0.044 Undermining: Wound Description Classification: Category/Stage II Foul Odor Af Wound Margin: Distinct, outline attached Slough/Fibri Exudate Amount: Medium Exudate Type: Serosanguineous Exudate Color: red, brown Wound Bed Granulation Amount: Large (67-100%) Granulation Quality: Red Fascia Expose Necrotic Amount: None Present (0%) Fat Layer (Su Tendon Expose Muscle Expose Joint Exposed Bone Exposed: ter Cleansing: No no No Exposed Structure d: No bcutaneous Tissue) Exposed: No d: No d: No : No No n Area: 50% n Volume: 50% tion: Medium (34-66%) No No Treatment Notes Wound #38 (Right Toe Fifth) 1. Cleanse With Wound Cleanser Soap and water 3. Primary Dressing Applied Calcium Alginate Ag 4. Secondary Dressing Dry Gauze 6. Support Layer Holiday representative) Signed: 08/06/2019 4:47:30 PM By: Mikeal Hawthorne EMT/HBOT Signed: 08/06/2019 5:31:10 PM By: Baruch Gouty RN, BSN Previous Signature: 08/04/2019 5:34:34 PM Version By: Deon Pilling Entered By: Mikeal Hawthorne on 08/06/2019 14:55:43 -------------------------------------------------------------------------------- Wound Assessment Details Patient Name: Date of Service: [name], [name] 08/04/2019 10:45 AM Medical Record [id number] Patient Account Number:  [id number] Date of Birth/Sex: Treating RN: Dec 13, 1960 (59 y.o. [name] Primary Care Amatullah Christy: [name] Other Clinician: Referring Kristi Norment: Treating Ormand Senn/Extender:Stone III, Danny Lawless, Randon Goldsmith in Treatment: 107 Wound Status Wound Number: 39 Primary Abrasion Etiology: Wound Location: Right Toe Fourth Wound Open Wounding Event: Trauma Status: Date Acquired: 07/21/2019 Comorbid Deep Vein Thrombosis, Colitis, Weeks Of Treatment: 2 History: Osteomyelitis, Neuropathy, Quadriplegia, Clustered Wound: No Confinement Anxiety Photos Wound Measurements Length: (cm) 0.2 % Reductio Width: (cm) 0.2 % Reductio Depth: (cm) 0.2 Epithelial Area: (cm) 0.031 Tunneling Volume: (cm) 0.006 Undermini Wound Description Full Thickness Without Exposed Support Foul Classification: Structures Sloug Wound Flat and Intact Margin: Exudate Small Amount: Exudate Serosanguineous Type: Exudate red, brown Color: Wound Bed Granulation Amount: Medium (34-66%) Granulation Quality: Pink Fascia Necrotic Amount: Medium (34-66%) Fat La Necrotic Quality: Eschar Tendon Muscle Joint Bone E Odor After Cleansing: No h/Fibrino Yes Exposed Structure Exposed: No yer (Subcutaneous Tissue) Exposed: Yes Exposed: No Exposed: No Exposed: No xposed: No n in Area: 67% n in Volume: 33.3% ization: Medium (34-66%) : No ng: No Treatment Notes Wound #39 (Right Toe Fourth) 1. Cleanse With Wound Cleanser Soap and water 3. Primary Dressing Applied Calcium Alginate Ag 4. Secondary Dressing Dry Gauze 6. Support Layer Holiday representative) Signed: 08/06/2019 4:47:30 PM By: Mikeal Hawthorne EMT/HBOT Signed: 08/06/2019 5:31:10 PM By: Baruch Gouty RN, BSN Previous Signature: 08/04/2019 5:34:34 PM Version By: Deon Pilling Entered By: Mikeal Hawthorne on 08/06/2019 14:56:48 -------------------------------------------------------------------------------- Vitals  Details Patient Name: Date of Service: [name], [name] 08/04/2019 10:45 AM Medical Record [id number] Patient Account Number: [id number] Date of Birth/Sex: Treating RN: 10-09-1960 (59 y.o. [name] Primary Care Homer Pfeifer: [name] Other Clinician: Referring Chariti Havel: Treating Jazlyne Gauger/Extender:Stone III, Danny Lawless, Randon Goldsmith in Treatment:

## 2019-08-18 ENCOUNTER — Other Ambulatory Visit: Payer: Self-pay

## 2019-08-18 ENCOUNTER — Other Ambulatory Visit (HOSPITAL_COMMUNITY)
Admission: RE | Admit: 2019-08-18 | Discharge: 2019-08-18 | Disposition: A | Discharge status: Home / Self Care | Payer: Medicare Other | Source: Other Acute Inpatient Hospital | Attending: Physician Assistant | Admitting: Physician Assistant

## 2019-08-18 ENCOUNTER — Encounter (HOSPITAL_BASED_OUTPATIENT_CLINIC_OR_DEPARTMENT_OTHER): Payer: Medicare Other | Admitting: Physician Assistant

## 2019-08-18 DIAGNOSIS — L97322 Non-pressure chronic ulcer of left 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: Secondary | ICD-10-CM | POA: Diagnosis not present

## 2019-08-18 DIAGNOSIS — L089 Local infection of the skin and subcutaneous tissue, unspecified: Secondary | ICD-10-CM | POA: Diagnosis present

## 2019-08-18 NOTE — Progress Notes (Signed)
[name], [name] ([id number]) Visit Report for 08/18/2019 Arrival Information Details Patient Name: Date of Service: [name], [name] 08/18/2019 10:45 AM Medical Record [id number] Patient Account Number: [id number] Date of Birth/Sex: Treating RN: 10/25/1960 (59 y.o. [name] Primary Care Kemesha Mosey: [name] Other Clinician: Referring Synda Bagent: Treating Jorma Tassinari/Extender:Stone III, Danny Lawless, Randon Goldsmith in Treatment: 109 Visit Information History Since Last Visit Added or deleted any medications: Yes Patient Arrived: Wheel Chair Any new allergies or adverse reactions: No Arrival Time: 11:01 Had a fall or experienced change in No Accompanied By: caregiver activities of daily living that may affect Transfer Assistance: None risk of falls: Patient Identification Verified: Yes Signs or symptoms of abuse/neglect No Secondary Verification Process Yes since last visito Completed: Hospitalized since last visit: No Patient Requires Transmission- No Implantable device outside of the clinic No Based Precautions: excluding Patient Has Alerts: Yes cellular tissue based products placed in Patient Alerts: R ABI 1.14, L the center ABI 1.16 since last visit: Has Dressing in Place as Prescribed: Yes Has Footwear/Offloading in Place as Yes Prescribed: Left: Multipodus Split/Boot Right: Multipodus Split/Boot Pain Present Now: No Electronic Signature(s) Signed: 08/18/2019 5:22:14 PM By: Deon Pilling Entered By: Deon Pilling on 08/18/2019 11:03:31 -------------------------------------------------------------------------------- Encounter Discharge Information Details Patient Name: Date of Service: [name], [name] 08/18/2019 10:45 AM Medical Record [id number] Patient Account Number: [id number] Date of Birth/Sex: Treating RN: 02-02-1961 (58 y.o. [name]) Carlene Coria Primary Care Roshawn Ayala: [name] Other Clinician: Referring Yee Gangi: Treating  Vylette Strubel/Extender:Stone III, Danny Lawless, Randon Goldsmith in Treatment: 109 Encounter Discharge Information Items Post Procedure Vitals Discharge Condition: Stable Temperature (F): 98.3 Ambulatory Status: Wheelchair Pulse (bpm): 83 Discharge Destination: Home Respiratory Rate (breaths/min): 20 Transportation: Private Auto Blood Pressure (mmHg): 145/82 Accompanied By: care giver Schedule Follow-up Appointment: Yes Clinical Summary of Care: Patient Declined Electronic Signature(s) Signed: 08/18/2019 5:15:50 PM By: Carlene Coria RN Entered By: Carlene Coria on 08/18/2019 12:25:03 -------------------------------------------------------------------------------- Lower Extremity Assessment Details Patient Name: Date of Service: [name], [name] 08/18/2019 10:45 AM Medical Record [id number] Patient Account Number: [id number] Date of Birth/Sex: Treating RN: March 07, 1961 (59 y.o. [name] Primary Care Pasqual Farias: [name] Other Clinician: Referring Dmiyah Liscano: Treating Jamiesha Victoria/Extender:Stone III, Danny Lawless, Randon Goldsmith in Treatment: 109 Edema Assessment Assessed: [Left: No] [Right: Yes] Edema: [Left: No] [Right: Yes] Calf Left: Right: Point of Measurement: cm From Medial Instep cm 23 cm Ankle Left: Right: Point of Measurement: cm From Medial Instep cm 22 cm Electronic Signature(s) Signed: 08/18/2019 5:22:14 PM By: Deon Pilling Entered By: Deon Pilling on 08/18/2019 11:09:15 -------------------------------------------------------------------------------- Multi-Disciplinary Care Plan Details Patient Name: Date of Service: [name], [name] 08/18/2019 10:45 AM Medical Record [id number] Patient Account Number: [id number] Date of Birth/Sex: Treating RN: 11/28/1960 (59 y.o. [name] Primary Care Talea Manges: [name] Other Clinician: Referring Janyia Guion: Treating Reagan Klemz/Extender:Stone III, Danny Lawless, Randon Goldsmith in Treatment: 109 Active  Inactive Soft Tissue Infection Nursing Diagnoses: Impaired tissue integrity Knowledge deficit related to disease process and management Knowledge deficit related to home infection control: handwashing, handling of soiled dressings, supply storage Goals: Patient's soft tissue infection will resolve Date Initiated: 08/18/2019 Target Resolution Date: 09/15/2019 Goal Status: Active Interventions: Assess signs and symptoms of infection every visit Provide education on infection Treatment Activities: Culture : 08/18/2019 Culture and sensitivity : 08/18/2019 Notes: Wound/Skin Impairment Nursing Diagnoses: Impaired tissue integrity Knowledge deficit related to ulceration/compromised skin integrity Goals: Patient/caregiver will verbalize understanding of skin care regimen Date Initiated: 07/14/2017 Target Resolution Date: 09/15/2019 Goal Status: Active Ulcer/skin breakdown will have a volume  [name], [name] ([id number]) Visit Report for 08/18/2019 Arrival Information Details Patient Name: Date of Service: [name], [name] 08/18/2019 10:45 AM Medical Record [id number] Patient Account Number: [id number] Date of Birth/Sex: Treating RN: 10/25/1960 (59 y.o. [name] Primary Care Kemesha Mosey: [name] Other Clinician: Referring Synda Bagent: Treating Jorma Tassinari/Extender:Stone III, Danny Lawless, Randon Goldsmith in Treatment: 109 Visit Information History Since Last Visit Added or deleted any medications: Yes Patient Arrived: Wheel Chair Any new allergies or adverse reactions: No Arrival Time: 11:01 Had a fall or experienced change in No Accompanied By: caregiver activities of daily living that may affect Transfer Assistance: None risk of falls: Patient Identification Verified: Yes Signs or symptoms of abuse/neglect No Secondary Verification Process Yes since last visito Completed: Hospitalized since last visit: No Patient Requires Transmission- No Implantable device outside of the clinic No Based Precautions: excluding Patient Has Alerts: Yes cellular tissue based products placed in Patient Alerts: R ABI 1.14, L the center ABI 1.16 since last visit: Has Dressing in Place as Prescribed: Yes Has Footwear/Offloading in Place as Yes Prescribed: Left: Multipodus Split/Boot Right: Multipodus Split/Boot Pain Present Now: No Electronic Signature(s) Signed: 08/18/2019 5:22:14 PM By: Deon Pilling Entered By: Deon Pilling on 08/18/2019 11:03:31 -------------------------------------------------------------------------------- Encounter Discharge Information Details Patient Name: Date of Service: [name], [name] 08/18/2019 10:45 AM Medical Record [id number] Patient Account Number: [id number] Date of Birth/Sex: Treating RN: 02-02-1961 (58 y.o. [name]) Carlene Coria Primary Care Roshawn Ayala: [name] Other Clinician: Referring Yee Gangi: Treating  Vylette Strubel/Extender:Stone III, Danny Lawless, Randon Goldsmith in Treatment: 109 Encounter Discharge Information Items Post Procedure Vitals Discharge Condition: Stable Temperature (F): 98.3 Ambulatory Status: Wheelchair Pulse (bpm): 83 Discharge Destination: Home Respiratory Rate (breaths/min): 20 Transportation: Private Auto Blood Pressure (mmHg): 145/82 Accompanied By: care giver Schedule Follow-up Appointment: Yes Clinical Summary of Care: Patient Declined Electronic Signature(s) Signed: 08/18/2019 5:15:50 PM By: Carlene Coria RN Entered By: Carlene Coria on 08/18/2019 12:25:03 -------------------------------------------------------------------------------- Lower Extremity Assessment Details Patient Name: Date of Service: [name], [name] 08/18/2019 10:45 AM Medical Record [id number] Patient Account Number: [id number] Date of Birth/Sex: Treating RN: March 07, 1961 (59 y.o. [name] Primary Care Pasqual Farias: [name] Other Clinician: Referring Dmiyah Liscano: Treating Jamiesha Victoria/Extender:Stone III, Danny Lawless, Randon Goldsmith in Treatment: 109 Edema Assessment Assessed: [Left: No] [Right: Yes] Edema: [Left: No] [Right: Yes] Calf Left: Right: Point of Measurement: cm From Medial Instep cm 23 cm Ankle Left: Right: Point of Measurement: cm From Medial Instep cm 22 cm Electronic Signature(s) Signed: 08/18/2019 5:22:14 PM By: Deon Pilling Entered By: Deon Pilling on 08/18/2019 11:09:15 -------------------------------------------------------------------------------- Multi-Disciplinary Care Plan Details Patient Name: Date of Service: [name], [name] 08/18/2019 10:45 AM Medical Record [id number] Patient Account Number: [id number] Date of Birth/Sex: Treating RN: 11/28/1960 (59 y.o. [name] Primary Care Talea Manges: [name] Other Clinician: Referring Janyia Guion: Treating Reagan Klemz/Extender:Stone III, Danny Lawless, Randon Goldsmith in Treatment: 109 Active  Inactive Soft Tissue Infection Nursing Diagnoses: Impaired tissue integrity Knowledge deficit related to disease process and management Knowledge deficit related to home infection control: handwashing, handling of soiled dressings, supply storage Goals: Patient's soft tissue infection will resolve Date Initiated: 08/18/2019 Target Resolution Date: 09/15/2019 Goal Status: Active Interventions: Assess signs and symptoms of infection every visit Provide education on infection Treatment Activities: Culture : 08/18/2019 Culture and sensitivity : 08/18/2019 Notes: Wound/Skin Impairment Nursing Diagnoses: Impaired tissue integrity Knowledge deficit related to ulceration/compromised skin integrity Goals: Patient/caregiver will verbalize understanding of skin care regimen Date Initiated: 07/14/2017 Target Resolution Date: 09/15/2019 Goal Status: Active Ulcer/skin breakdown will have a volume  Foot) 1. Cleanse With Wound Cleanser Soap and water 3. Primary Dressing Applied Santyl 4. Secondary Dressing Dry Gauze 6. Support Layer  Holiday representative) Signed: 08/18/2019 5:22:14 PM By: Deon Pilling Entered By: Deon Pilling on 08/18/2019 11:11:50 -------------------------------------------------------------------------------- Wound Assessment Details Patient Name: Date of Service: [name], [name] 08/18/2019 10:45 AM Medical Record [id number] Patient Account Number: [id number] Date of Birth/Sex: Treating RN: May 19, 1961 (59 y.o. [name] Primary Care Diego Delancey: [name] Other Clinician: Referring Camrin Lapre: Treating Kyanne Rials/Extender:Stone III, Danny Lawless, Randon Goldsmith in Treatment: 109 Wound Status Wound Number: [id number] Primary Pressure Ulcer Etiology: Wound Location: Right Toe Fifth Wound Open Wounding Event: Shear/Friction Status: Date Acquired: 07/21/2019 Comorbid Deep Vein Thrombosis, Colitis, Weeks Of Treatment: 4 History: Osteomyelitis, Neuropathy, Quadriplegia, Clustered Wound: No Confinement Anxiety Wound Measurements Length: (cm) 0.3 % Reduction Width: (cm) 0.6 % Reduction Depth: (cm) 0.2 Epitheliali Area: (cm) 0.141 Tunneling: Volume: (cm) 0.028 Underminin Wound Description Classification: Category/Stage II Foul Odor Wound Margin: Distinct, outline attached Slough/Fib Exudate Amount: Medium Exudate Type: Serosanguineous Exudate Color: red, brown Wound Bed Granulation Amount: Large (67-100%) Granulation Quality: Red Fascia Exp Necrotic Amount: Small (1-33%) Fat Layer Necrotic Quality: Adherent Slough Tendon Exp Muscle Exp Joint Expo Bone Expos After Cleansing: No rino Yes Exposed Structure osed: No (Subcutaneous Tissue) Exposed: No osed: No osed: No sed: No ed: No in Area: 68% in Volume: 68.2% zation: Medium (34-66%) No g: No Treatment Notes Wound #38 (Right Toe Fifth) 1. Cleanse With Wound Cleanser Soap and water 3. Primary Dressing Applied Calcium Alginate Ag 4. Secondary Dressing Dry Gauze 5. Secured  With Recruitment consultant) Signed: 08/18/2019 5:22:14 PM By: Deon Pilling Entered By: Deon Pilling on 08/18/2019 11:14:37 -------------------------------------------------------------------------------- Wound Assessment Details Patient Name: Date of Service: [name], [name] 08/18/2019 10:45 AM Medical Record [id number] Patient Account Number: [id number] Date of Birth/Sex: Treating RN: May 14, 1961 (59 y.o. [name] Primary Care Shaianne Nucci: [name] Other Clinician: Referring Kyleah Pensabene: Treating Abrian Hanover/Extender:Stone III, Danny Lawless, Randon Goldsmith in Treatment: 109 Wound Status Wound Number: [id number] Primary Etiology: Abrasion Wound Location: Right Toe Fourth Wound Status: Healed - Epithelialized Wounding Event: Trauma Date Acquired: 07/21/2019 Weeks Of Treatment: 4 Clustered Wound: No Wound Measurements Length: (cm) 0 % Reduct Width: (cm) 0 % Reduct Depth: (cm) 0 Area: (cm) 0 Volume: (cm) 0 Wound Description Classification: Full Thickness Without Exposed Support Structures ion in Area: 100% ion in Volume: 100% Electronic Signature(s) Signed: 08/18/2019 5:22:14 PM By: Deon Pilling Entered By: Deon Pilling on 08/18/2019 11:10:34 -------------------------------------------------------------------------------- Vitals Details Patient Name: Date of Service: [name] 08/18/2019 10:45 AM Medical Record [id number] Patient Account Number: [id number] Date of Birth/Sex: Treating RN: [birth year] (59 y.o. [name] Primary Care Falyn Rubel: [name] Other Clinician: Referring Khloi Rawl: Treating Taseen Marasigan/Extender:Stone III, Danny Lawless, Randon Goldsmith in Treatment: 109 Vital Signs Time Taken: 11:04 Temperature (F): 98.3 Height (in): 73 Pulse (bpm): 83 Weight (lbs): 178 Respiratory Rate (breaths/min): 20 Body Mass Index (BMI): 23.5 Blood Pressure (mmHg): 145/82 Reference Range: 80 - 120 mg / dl Electronic  Signature(s) Signed: 08/18/2019 5:22:14 PM By: Deon Pilling Entered By: Deon Pilling on 08/18/2019 11:06:11

## 2019-08-18 NOTE — Progress Notes (Addendum)
[name], [name] ([id number]) Visit Report for 08/18/2019 Chief Complaint Document Details Patient Name: Date of Service: [name], [name] 08/18/2019 10:45 AM Medical Record [id number] Patient Account Number: [id number] Date of Birth/Sex: Treating RN: 1960/08/19 (59 y.o. [name] Primary Care Provider: Orpah Cobb Other Clinician: Referring Provider: Treating Provider/Extender:Stone III, Reginal Lutes, Orlie Pollen in Treatment: 109 Information Obtained from: Patient Chief Complaint Patient is here for multiple wounds on his bilateral feet and left ankle. Electronic Signature(s) Signed: 08/18/2019 11:35:54 AM By: Lenda Kelp PA-C Entered By: Lenda Kelp on 08/18/2019 11:35:54 -------------------------------------------------------------------------------- Debridement Details Patient Name: Date of Service: [name], [name] 08/18/2019 10:45 AM Medical Record [id number] Patient Account Number: [id number] Date of Birth/Sex: 13-Aug-1960 (58 y.o. M) Treating RN: Zenaida Deed Primary Care Provider: Orpah Cobb Other Clinician: Referring Provider: Treating Provider/Extender:Stone III, Reginal Lutes, Orlie Pollen in Treatment: 109 Debridement Performed for Wound #37 Right,Lateral Foot Assessment: Performed By: Little Ishikawa, RN Debridement Type: Chemical/Enzymatic/Mechanical Agent Used: Santyl Level of Consciousness (Pre- Awake and Alert procedure): Pre-procedure Verification/Time Out Taken: Yes - 12:07 Start Time: 12:07 Bleeding: None Response to Treatment: Procedure was tolerated well Level of Consciousness Awake and Alert (Post-procedure): Post Debridement Measurements of Total Wound Length: (cm) 4 Stage: Category/Stage III Width: (cm) 2.5 Depth: (cm) 0.2 Volume: (cm) 1.571 Character of Wound/Ulcer Post Requires Further Debridement Debridement: Post Procedure Diagnosis Same as Pre-procedure Electronic  Signature(s) Signed: 08/18/2019 5:14:16 PM By: Lenda Kelp PA-C Signed: 08/18/2019 5:36:50 PM By: Zenaida Deed RN, BSN Entered By: Zenaida Deed on 08/18/2019 12:07:45 -------------------------------------------------------------------------------- HPI Details Patient Name: Date of Service: [name], [name] 08/18/2019 10:45 AM Medical Record [id number] Patient Account Number: [id number] Date of Birth/Sex: Treating RN: 06/23/1960 (59 y.o. [name] Primary Care Provider: Orpah Cobb Other Clinician: Referring Provider: Treating Provider/Extender:Stone III, Reginal Lutes, Orlie Pollen in Treatment: 109 History of Present Illness HPI Description: 11/23/15; this is a patient I remember from a previous stay in this clinic in 2012. The patient says this was for a wound in this same area as currently although I have no information to verify that. Most of the information is provided by his caregiver from the group home where he resides. Apparently he has had a wound in this area for 8 months. He also has erythema around this area and has had multiple rounds of antibiotics apparently with some improvement but the erythema is persists. He apparently had a fracture in the ankle 2 months ago and was seen by Dr. Lajoyce Corners . He apparently had a splint applied and more recently a heel offloading boot. He had home health coming out to a week ago not clear what they are dressing this with but they apparently discharged him perhaps because of one of Dr. 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 He has a Nurse, adult for transferring at home he is nonambulatory and does not wear footwear. He is not a diabetic. Looking through [facility] he appears to have fairly active primary care. The area on the ankle is variably labeled as a pressure area and/or chronic venous insufficiency. He had a recent venous ultrasound on 5/25 that did not show a DVT  in the left leg however this was not a reflux study. There was no superficial DVT either. Arterial studies on 10/15/14 showed a left ABI of 1.16 Center right of 1.12 waveforms were triphasic he does not have significant arterial disease. He has had recent x-rays of the left foot and  ankle. The left foot did not show any abnormality. Left ankle x-ray showed a spiral fracture of the left tibial diaphysis without evidence of osteomyelitis. 11/30/15 culture I did last week showed pseudomonas I changed him to oral ciprofloxacin. Erythema is a lot better. 12/07/15 area around the wound shows considerable improvement of the periwound erythema. 12/14/15; the patient has a improvement of the periwound erythema which in retrospect I think with cellulitis successfully treated. We have been using Iodoflex started last week 12/25/15 wound on the left medial malleolus and dorsal left foot. Theraskin #1 applied READMISSION 02/27/16; the patient was admitted to hospital from 8/14 through 01/18/16 initially felt to have cellulitis of his left lower leg however he was noted to have significant persistent pain and swelling. A duplex ultrasound was ordered that was negative. [place] an x-ray was done that showed a spiral fracture of the left leg. With posterior displacement and angulation at the mid left tibia. Orthopedics saw the patient and splinted the leg. He was then sent to [facility] skilled facility and only return to his group home last week. He has wounds on the left medial malleolus, left dorsal foot and a worrisome area on the plantar left heel which is not really open but may represent a hematoma or a DTI 03/06/16; areas of left anterior and medial ankle both look improved. There are 3 small wounds here. He has a area over the dorsal left first metatarsal head which is not open. In meantime his left heel has opened 03/14/16 the medial left ankle wounds looks stable to improved. The area on the posterior left  heel has opened up and had a very adherent eschar and slept over this 03/21/16 patient has 3 wounds on his left medial ankle is stable area on his left dorsal first metatarsal head appears to be improved. Continued problems with adherent eschar over the left heel 03/28/16; patient has 3 wounds on his left medial ankle and lower leg all of these appear to be stable. The problem continues to be the left heel and the adherent eschar. This may have been a pressure ulcer from her brace at one point. He has a Social worker we have been using [employer] and all wound areas. Dressings are being changed by home health 04/04/16; the patient's areas on the medial aspect of the left ankle looks superficial and appear to be gradually closing. The area on the plantar aspects/Achilles aspect of his left heel as surface slough and nonviable subcutaneous tissue requiring debridement. 04/11/16; the areas on the patient's medial left ankle appear to be superficial and appear to be closing. The area on the plantar heel on the left has surface slough and nonviable subcutaneous tissue requiring debridement. I don't think this is too much different from last week there is also undermining 04/25/16; patient is followed in his group home by [facility] home health. The area on the plantar heel is smaller but with still with some depth. We're using Iodoflex to this. The areas on the left ankle looks healthy but dry and somewhat larger. Cause of this is not really clear using Prisma 05/02/16; areas x3 all look improved expecially the areas on the medial ankle. Heel wound is smaller but still deep 05/09/16; all areas 3 look improved on the medial ankle. Heel wound is also contracting. 12/0.9/17 the areas on his left medial ankle are all closed. I think these are largely chronic venous insufficiency however there is no edema. The area on his left heel  was a pressure area. READMISSION 08/02/16; this man is a patient that we have  had in this clinic on several times before. I believe he has cerebral palsy is nonambulatory. He has chronic venous insufficiency with venous inflammation. He was last year in the fall of 2017 with areas on his left medial ankle left heel and left dorsal foot. He arrives today with once again a vague history. As mentioned he is nonambulatory he spends most of his day in a pressure offloading boot nevertheless they noted wounds in this area on the medial left ankle 2 weeks ago. There is any other history here I am uncertain 08/15/16; the patient's wounds on his medial ankle actually look better however the area on the left lateral/dorsal foot is covered in black necrotic eschar. Previous formal arterial studies did not show significant arterial disease with normal ABIs and triphasic waveforms. He is immobilized and at risk for pressure although he seems aware protective boots at all times as far as I am aware. 08/22/16; in general his wounds look better. All the wounds on his medial left ankle look improved the area on his lateral foot however continues to be problematic. Patient comes in the clinic today in a new wheelchair he is quite delighted 09/05/16; a wound on the left medial Calf and left dorsal foot are healed. The area on the left lateral foot is improved however the area on the left medial malleolus is inflamed with a considerable degree of surrounding erythema. This looks like cellulitis. ABIs and clinical exam are within normal limits 09/12/16; the patient continues to have a wound on the left medial calf. I was concerned about cellulitis in this area last week and gave him a course of doxycycline. The wound looks better here. The area on the left lateral foot however is unchanged 09/20/16; the patient came in this week early after traumatizing his toe on the left foot on a table while moving around in his motorized wheelchair. 09/26/16- he is here for follow-up evaluation of his left foot  and malleolus ulcers. Since his appointment he abraded his left medial hallux against a table while in his motorized wheelchair, apparently trying to pull up to the table for a meal. He continues to complain of tenderness to the left lateral foot, surrounding the current ulcer 10/07/16; left foot and malleolus ulcers. 10/14/16; the patient is doing well. He has a wound on the left lateral, left medial malleolus and atraumatic wound on the left great toe. Although these appear to be superficial and closing 10/31/16; the patient is doing well. The area on the left lateral foot, left medial malleolus and the left great toe or all epithelialized. He has home health 11/14/16; patient arrives today with the area on the left great toe healed. The left lateral foot however in the left medial malleolus and the ankle superficial wounds that are slightly larger. He has erythema and tenderness around the area on the left lateral foot. As well he is complaining of dysuria. His caregiver notes that when he gets like this he is more lethargic which indeed seems to be the case today 11/21/16; left great toe is still healed. His wound on the left lateral foot appears healthy and slightly smaller also the area on the left medial ankle. This had black eschar on it which I removed. The remaining wound looks satisfactory 12/12/16; patient hasn't been here in a number of weeks. The area on the left lateral foot is healed however on his  medial ankle has 2 open areas one of them covered with a necrotic eschar. He also has a new wound on the right second toe which was apparently trauma induced while he was at a movie 12/27/16; the patient has wounds on his left medial mallelus and left lateral foot that are just about closed. The area on the right second toe which was trauma from last time is still open but again everything looks a lot better here. 01/30/17; patient arrives in clinic today and is really been a long time since I've  seen him. The wounds are all epithelialized on the left foot this includes his left lateral foot, left medial malleolus. Apparently was in the ER for what of I think was a fungal infection on his buttock's he was prescribed Diflucan and doxycycline I think this is already getting better. READMISSION Delma is a now 59 year old man who has advanced cerebral palsy and functional quadriplegia. We have had him in this clinic multiple times before for wounds on his right ankle, left ankle, left dorsal foot and most recently from 08/02/16 through 01/30/17 with his left medial ankle and left foot and finally right second toe. His attendant from the group home where he lives is very knowledgeable about him. She states that they noticed breakdown in his skin in his feet sometime in January although they could not get an appointment until today in this clinic. The previous wounds have always been felt to be secondary to incidental trauma/pressure areas or possibly some degree of chronic venous insufficiency. He had normal arterial studies in may of 2016. He has definite open wounds on the right lateral foot over the base of the fifth metatarsal, the right second toe DIP, the left medial malleolus. He has concerning areas over the left lateral fifth metatarsal base, retaform purpura over the medial left ankle and perhaps the medial left first toe. 07/21/17; culture we did last week from the base of the right fifth metatarsal grew group G strep. This should've been sensitive to the doxycycline I gave him. The other major wounds are on the right second toe DIP, left medial malleolus, base of the left lateral fifth metatarsal and the medial left ankle and medial left toe. 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 We have macrovascular arterial studies scheduled for Thursday although I don't think this is what the problem is. 07/28/17; patient arrives with really no improvement. He has a wound on the  right second toe, lateral right foot, right medial malleolus which was new this week, left medial malleolus, but looked like a DTI on the left great toe. His macrovascular studies are on 07/30/17 although I would be surprised if this provides all the explanation. He has small areas even on his right second and third toe which look like splinter-type hemorrhages. The question I have is where is this coming from. I'd like to see the vascular studies however before we pursue this. He has had a DVT in the past but is no longer on anticoagulants. I'll need to review his history 08/04/17; the patient had his ARTERIAL STUDIES this showed a right ABI of 1.16 and the left ABI of 1.20. TBI on the left at 0.84. He is at a right first toe amputation. The studies were unchanged from studies in May 2016. It was not felt that he had significant lower extremity arterial disease In the meantime we are using silver alginate to the wounds on the right second toe right lateral foot left medial  malleolus. I'm going to change to Silver collagen today. 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 Working him up for this would include an echocardiogram probably a CT scan of the chest and abdomen to look at the major thoracic and abdominal aortic vessels. I am not going to involve myself in this unless there is more symptoms/signs 08/18/17; the patient arrives today with wounds looking somewhat better. The remaining areas are the right second toe dorsal, right lateral foot and left medial malleolus which only has a small open area. We've been using silver collagen 09/01/17;right second toe dorsally probes to bone today. This is a deterioration. We've been using silver collagen. Only a small open area remains on the left medial malleolus. 09/09/17; right second toe appears better. There is no probing bone.  He has a small area remaining on the left medial malleolus, the right lateral foot. We have been using silver collagen 10/07/17; the patient returns to clinic today after a month hiatus. He has wounds over the right second toe, right lateral foot and the left medial malleolus. We have been using silver collagen. He has home health. 10/24/17; patient has open wounds over the right second toe, now the left fourth toe and a superficial area over his left buttock. Some of the other areas have healed including the left medial malleolus and right lateral foot. Using silver collagen on these wounds 11/07/17; the patient has closed his wounds on the right second toe and left fourth toe although they look all normal. Last time he was here he had a superficial area over the left buttock this is also closed. The right lateral foot wound I had closed out last time although it is seems to have reopened today. 628/19 the areas over the right second and left fourth toe remains closed. The right lateral foot has tightly adherent necrotic surface over. We have been using silver collagen he offloads this and a bunny boot 12/12/17; the patient has reopened the recurrent area over the right second PIP joint. 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 He does not have obvious macrovascular disease. The area over the right lateral foot looks about the same as last time tightly adherent necrotic debris. We've been using Hydrofera Blue 12/25/17; 2 week follow-up. The area over the right second PIP is closed however the area on the right lateral foot is worse with surrounding erythema and pain compatible with cellulitis is going to need an antibiotic. AS WELL..... We have looked at his buttocks and lower back. He has 3 stage II ulcers. The exact history here is unclear however he is very disabled man with cerebral palsy. He goes to a day program from the group home  in which she lives 5 days a week and I think he is on his buttock all day on these visits. Surrounding the wounds see has extensive stage I injury as well. FINALLY it looks as though he has lost weight. I'm not the only staff member that is recognized this 01/08/18 on evaluation today patient appears to be doing better in regard to his gluteal wounds. In fact everything appears to be healed back here although very dry. He has a couple open sores on the scrotal region other than this it is really his foot that is still giving him the most trouble. He did  complete the Keflex unfortunately it seems like there's still a lot of erythema in this in fact looks worse than it did previous. I'm gonna culture this today and likely start with a different antibiotic. 01/16/18; I was reassured that the wounds on his buttocks and sacrum are healed. I did not look at these the day. The culture that was done last week on 01/11/18 of the right lateral foot wounds showed Pseudomonas. Bactrim is not a good choice for this. Ciprofloxacin interacts with his muscle relaxants therefore I prescribed cefdinir 300 twice a day for 7 days. Bactrim coverage of pseudomonas is not reliable although the degree of erythema looks a lot better 01/30/18; the patient has completed his antibiotics. The area does not have infection on the right lateral foot. The wounds look a lot better. We've been using silver alginate 02/12/18; there is on the right lateral foot. Copious amounts of surface eschar. Also similarly over the PIP of the right second toe. We've been using silver alginate to date 03/02/2018; the area that remains is on the right lateral foot. The right second toe is healed. 03/16/2018; the area there is original wound on the right lateral foot. Still requiring debridement. We are using Hydrofera Blue. The right second toe is still healed/closed 03/30/2018; right lateral foot there is 2 small open areas remaining. They actually  look quite healthy we have been using Hydrofera Blue the right second toe remains closed 04/13/2018; right lateral foot. 1 of the 2 small open areas from last time has closed over. The other had surface slough that I removed with a #3 curette. This looks quite healthy. The right second toe remains closed. We are using Hydrofera Blue 05/01/2018; right lateral foot very superficial areas that look like they are trying to close over. He had a new threatened area on the left medial ankle just below the medial malleolus. I think these represent chronic venous insufficiency areas 05/14/2018; the right right lateral foot looks like it closed over as was predicted last time however he has some dark areas subcutaneously erythema superiorly and this is really very tender. This is either coexistent cellulitis or perhaps a deep tissue injury. The area on the left medial ankle is superficial but has some size. We have been using Hydrofera Blue to both areas. The patient complains of pain in his right foot 06/09/2018; right lateral foot was closed over last time but there was coexistent cellulitis around this. I gave him Levaquin. He also has an area on the left medial malleolus. He has not been back to clinic in over 3-1/2 weeks. We are using Hydrofera Blue I think to the left medial malleolus and silver alginate to the right lateral foot 1/21; right lateral foot still non-viable debris over this area and the left medial malleolus. Both of these vigorously washed off with antiseptic gauze. We have been using silver alginate. 2/4; right lateral foot this looks somewhat better. Unfortunately the area over the left medial malleolus has deteriorated. Using silver alginate in both area 2/18; both wounds arrived today covered in a lot of necrotic debris. We have been using silver alginate. Clearly not making a lot of progress. 3/3; the patient's right second toe is reopened over the PIP. I am not sure why this is  happening. He may have microvascular disease. We have checked his macrovascular flow in the past and found it to be normal. He may need an x-ray. The area on the right lateral foot requires debridement. The area  on the left lateral ankle is worse. We have been using PolyMem Ag 08/26/18 on evaluation today patient appears to be doing a little worse in regard to his lower extremities based on what I'm seeing what the nursing staff is telling me. His left medial ankle appears potential to be infected there is your thing on want surrounding the wound. His right second toe is also of concern at this point in my pinion. We have not done x-rays of these areas that may be a good idea to go ahead and proceed with at this point to ensure will not deal with anything such as osteomyelitis. 4/16; it is been a while since I saw this patient. He now has wounds on the right lateral foot, dorsal right second toe, substantially on the left medial malleolus and a small area on the left lateral foot. We have been using PolyMem Silver. Curlex and Coban. He has home health changing the dressing. Since last time he was here he had a culture of the left medial ankle that showed MRSA. He is completed a course/7 days of doxycycline. XRAYS of the left ankle and right foot that were ordered were negative for osteomyelitis 4/30; he arrives in clinic today with some maceration around the wound and a lot of tenderness and erythema in a circumferential area around the right lateral foot. He has wound areas on the right lateral foot dorsal right second toe, largely over the left medial malleolus and a small area over the left lateral foot 5/7; completed his antibiotics from last time right lateral foot cellulitis is better and the area on his left knee which we discovered after he had been seen by the provider also is improved. He still has extensive wound areas over the right lateral foot, left medial malleolus, right second toe  and left lateral foot although all of these look somewhat better with PolyMem 5/15. Wounds on the right lateral foot, right second toe left medial malleolus and the left lateral foot. All of these look somewhat better. We have been using PolyMem Ag 5/26; the wound on the right lateral foot is almost closed over but he still complains of a lot of pain in this area. He has a eschared area on the right second toe but no open wound.. Also concerning today his original wound on the left medial malleolus looks healthy however just inferior to this almost areas of deep tissue injury and there is an area on the left lateral foot also looks like a deep tissue injury. In the past I have wondered whether he might have cholesterol emboli or some other form of a vasculopathy. I wonder whether this could have something to do with this. 6/2; right lateral foot wound is still open I gave him empiric antibiotics last week without really a lot of change she is still complaining of pain in this area. We x-rayed his feet at some point in April these were negative. It would be difficult to do an MRI. The area on his left medial malleolus looks better. Right second toe is healed. He still has what looks to be a small pressure related area on the left lateral foot but it is not open. We are using PolyMem to all wound which seems to be doing nicely 6/16; right lateral foot wound is still open. The wound looks better although there is still a lot of tenderness around it. Rather than more empiric antibiotics I have cultured this today. We are using PolyMem Ag.  On the left lateral malleolus the wound appears better. He has a new area on the left lower anterior pretibial area 6/29; 2-week follow-up. Culture I did of the right lateral foot 2 weeks ago showed MRSA. I gave him 2 weeks of Septra which he should be just about finishing now and this seems better. We got a call from home health last week to report swelling in the  left leg because we were listed as his primary. We referred him back to primary care. He arrives today with intense erythema and tenderness around the 2 wounds on the left medial ankle on the left lower calf. The area on the right lateral foot looks better 7/7; I brought him back this week to follow-up on his left foot which had cellulitis last week. I gave him clindamycin which as far as I know he tolerated. The erythema is better. He has a 3 current wounds one on the right lateral foot a small area on the right lateral malleolus and the more recent area on the left anterior pretibial area. 7/21; bilateral distal lower leg and foot wounds. The area on his right lateral foot looks a lot better. He has an area on his left pretibial area distally. The area on the medial malleolus on the left which is 1 of his worst wounds has closed over. He does not appear to have any open areas in his remaining toes 8/4-Patient returns at 2 weeks, he has the remaining wound on the left lower leg the other 2 wounds have healed. Patient is continued to be treated with silver alginate with a 2 layer wrap on the left 8/18; I been following this patient for a long period for bilateral lower extremity wounds. 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 His wounds are probably pressure although he religiously wears thick bunny boots. He does not have macrovascular disease but I did formal testing on him perhaps 2 or 3 years ago. I have often wondered about either cholesterol emboli or a vasculopathy of some sort given the distal nature of these variable locations etc. He has 2 areas that we have been following one on the left medial lower leg and one on the right lateral foot. Both of these are mostly healed today there is some eschar over sections of both areas although I did not disturb this. 03/31/2019 on evaluation today patient appears to be doing a little bit more  poorly in regard to the sacral region. He has 2 small wounds noted at this point. Fortunately there is no signs of active infection at this time. No fevers, chills, nausea, vomiting, or diarrhea. He is having some pain when he sits on his gluteal region a big part of the reason he has these wounds is likely due to the fact that his wheelchair has not been functioning properly. He does still have a couple areas of deep tissue injury on his lower extremities he does need new Prevalon offloading boots as well. 04/28/2019 on evaluation today patient appears to be doing well with regard to his wounds in particular. His ankle area has fairly small based on what I am seeing currently and his sacral region actually is still open but does not appear to be doing too poorly in my opinion. Fortunately there is no signs of active infection at this time. He was in the hospital unfortunately and this was from 04/08/2019 through 04/12/2019 secondary to persistent diarrhea, dehydration and acute kidney  injury. With that being said fortunately the patient does not seem to be doing too poorly at this time which is good news. He is having pain mainly in the sacral region. 06/23/2019 on evaluation today patient appears to be doing well with regard to his sacral region which in fact is completely healed. Unfortunately he has an area of rubbing/pressure on his right lateral chest/back region. This is where it is rubbing up on his chair. Subsequently he also has an open wound on the left medial malleolus which is not very open but starting to crack and then on the right lateral foot this is much more open at this point. Unfortunately there is no signs of significant improvement in regard to these areas and in fact there does appear to be evidence of active infection quite significantly. No fevers, chills, nausea, vomiting, or diarrhea. The patient did test positive for COVID-19 that has been greater than 14 days ago  although they did not know the exact timing. That is why he was not here for his last appointment. 07/07/2019 upon evaluation today patient actually appears to be doing quite well in regard to his left ankle as well as the chest/back region which is doing great in fact appears to be healed in my opinion. He still has an issue on his lateral right foot that is open and appears to be infected but does have me more concerned. In fact I do believe the doxycycline helped in general for the cellulitis but I believe that may have been more MRSA related based on the culture that we did. What I am seeing right now has more of the blue-green drainage and may be more consistent with a Pseudomonas that I was hoping was not really causing much of an issue but I think it may be at this point on the right lateral foot. For that reason I do think treatment would be a good idea of the reason I avoided the Cipro before was the potential for QT prolongation which could obviously cause problems with the patient with results of interaction with respiratory wound. The tizanidine also had some interactions as well. Nonetheless I think at this point that it would be a possibility for [place] to use topical gentamicin to see if this could be of benefit for the patient underneath the silver alginate dressing. 07/21/2019 upon evaluation today patient appears to be doing fairly well at this point in regard to his lower extremity on the right. He does have a wound on his right lateral foot as well as the right fifth toe and the right fourth toe. Unfortunately he continues to experience significant issues with pressure and trauma to some degree and I think this is just due to the fact that again he is not really not able to control his legs and what is happening here. Fortunately there is no signs of active infection at this time. 08/04/2019 upon evaluation today patient actually appears to be doing quite well with regard to his toe  ulcers I am very pleased in that regard. Unfortunately the foot ulcer is showing some signs of erythema his primary care provider has placed him on clindamycin at this point. Again I do believe this can be beneficial for him. With that being said I do think the patient may benefit from a light Kerlix and Coban wrap to help with some of his edema at this point. 08/18/2019 upon evaluation today patient appears to be doing still poorly in regard to his  lateral foot ulcer. This is not very deep but there is some concern about the possibility of osteomyelitis. I think it would be appropriate for [place] to go ahead and have an x-ray at this time. The patient is currently on clindamycin which from his culture which was performed on 06/23/2019 it does appear that he would benefit from that in regard to the MRSA. However Pseudomonas was also identified then and the patient actually has some blue-green drainage at this point as well. I am much more concerned about the possibility of the Pseudomonas being an infection is causative agent at this point that I am the MRSA. Subsequently Cipro the patient was sensitive to but we never use that is stable use a topical gent due to the fact that unfortunately there was some interaction between the Cipro and the patient's risk for down potentially causing QT prolongation. Electronic Signature(s) Signed: 08/18/2019 12:09:19 PM By: Lenda Kelp PA-C Entered By: Lenda Kelp on 08/18/2019 12:09:19 -------------------------------------------------------------------------------- Physical Exam Details Patient Name: Date of Service: [name], [name] 08/18/2019 10:45 AM Medical Record [id number] Patient Account Number: [id number] Date of Birth/Sex: Treating RN: 10-Jan-1961 (59 y.o. [name] Primary Care Provider: Orpah Cobb Other Clinician: Referring Provider: Treating Provider/Extender:Stone III, Reginal Lutes, Orlie Pollen in Treatment:  109 Constitutional Well-nourished and well-hydrated in no acute distress. Respiratory normal breathing without difficulty. Psychiatric this patient is able to make decisions and demonstrates good insight into disease process. Alert and Oriented x 3. pleasant and cooperative. Notes Upon inspection patient's wound again showed signs of erythema surrounding the wound bed there is definitely cellulitis noted I am not really sure the clindamycin is helping and to be honest this may be due to the fact that Pseudomonas is likely the causative agent secondary to the fact that the patient has significant blue-green drainage consistent with Pseudomonas upon inspection today. I am going obtain a new culture as the other one was back in January to see where things stand that is been almost 2 months back. Electronic Signature(s) Signed: 08/18/2019 12:09:47 PM By: Lenda Kelp PA-C Entered By: Lenda Kelp on 08/18/2019 12:09:46 -------------------------------------------------------------------------------- Physician Orders Details Patient Name: Date of Service: [name], [name] 08/18/2019 10:45 AM Medical Record [id number] Patient Account Number: [id number] Date of Birth/Sex: Treating RN: November 07, 1960 (58 y.o. [name] Primary Care Provider: Orpah Cobb Other Clinician: Referring Provider: Treating Provider/Extender:Stone III, Reginal Lutes, Orlie Pollen in Treatment: [phone] Verbal / Phone Orders: No Diagnosis Coding ICD-10 Coding Code Description [phone] Pressure ulcer of left ankle, stage 2 L89.892 Pressure ulcer of other site, stage 2 L89.893 Pressure ulcer of other site, stage 3 I73.89 Other specified peripheral vascular diseases Follow-up Appointments Return Appointment in 2 weeks. Dressing Change Frequency Wound #37 Right,Lateral Foot Change dressing three times week. Wound #38 Right Toe Fifth Change dressing three times week. Skin Barriers/Peri-Wound  Care Barrier cream - to sacrum as needed Moisturizing lotion - both legs daily Wound Cleansing Wound #37 Right,Lateral Foot May shower and wash wound with soap and water. - with dressing changes Wound #38 Right Toe Fifth May shower and wash wound with soap and water. - with dressing changes Primary Wound Dressing Wound #37 Right,Lateral Foot Santyl Ointment Wound #38 Right Toe Fifth Calcium Alginate with Silver Secondary Dressing Wound #37 Right,Lateral Foot Kerlix/Rolled Gauze Dry Gauze Wound #38 Right Toe Fifth Kerlix/Rolled Gauze Dry Gauze Edema Control Kerlix and Coban - Right Lower Extremity - wrap from base of toes to tibial tuberosity Elevate legs  to the level of the heart or above for 30 minutes daily and/or when sitting, a frequency of: Support Garment 10-20 mm/Hg pressure to: - support hose both legs daily Off-Loading Multipodus Splint to: - sage boots both feet at all times. please float feet to offload pressure to lateral side of feet. Gel mattress overlay (Group 1) - for extended mattress/bed Turn and reposition every 2 hours Home Health Continue Home Health skilled nursing for wound care. - [facility] Bacteria identified in Unspecified specimen by Anaerobe culture (MICRO) - right lat foot LOINC Code: 635-3 Convenience Name: Anerobic culture Radiology X-ray, foot right complete view - nonhealing ulcer right lateral foot CPT 73630 - (ICD10 L89.893 - Pressure ulcer of other site, stage 3) Electronic Signature(s) Signed: 08/18/2019 5:14:16 PM By: Lenda Kelp PA-C Entered By: Lenda Kelp on 08/18/2019 12:13:11 -------------------------------------------------------------------------------- Prescription 08/18/2019 Patient Name: [name] Provider: Lenda Kelp PA Date of Birth: [birth year] NPI#: [id number] Sex: M DEA#: [id number] Phone #: [phone] License #: Patient Address: Eligha Bridegroom [facility] [address] [place], [place] [address] [address] [place], [place] [address] [phone] Allergies adhesive tape Reaction: rash Severity: Moderate Provider's Orders X-ray, foot right complete view - ICD10: L89.893 - nonhealing ulcer right lateral foot CPT 73630 Signature(s): Date(s): Electronic Signature(s) Signed: 08/18/2019 5:14:16 PM By: Lenda Kelp PA-C Entered By: Lenda Kelp on 08/18/2019 12:13:12 --------------------------------------------------------------------------------  Problem List Details Patient Name: Date of Service: [name], [name] 08/18/2019 10:45 AM Medical Record [id number] Patient Account Number: [id number] Date of Birth/Sex: Treating RN: [birth year] (58 y.o. [name] Primary Care Provider: Orpah Cobb Other Clinician: Referring Provider: Treating Provider/Extender:Stone III, Reginal Lutes, Orlie Pollen in Treatment: 109 Active Problems ICD-10 Evaluated Encounter Code Description Active Date Today Diagnosis L89.522 Pressure ulcer of left ankle, stage 2 04/28/2019 No Yes L89.892 Pressure ulcer of other site, stage 2 06/23/2019 No Yes L89.893 Pressure ulcer of other site, stage 3 06/23/2019 No Yes I73.89 Other specified peripheral vascular diseases 08/26/2018 No Yes Inactive Problems ICD-10 Code Description Active Date Inactive Date 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 07/14/2017 07/14/2017 breakdown of skin L03.116 Cellulitis of left lower limb 08/26/2018 08/26/2018 L89.312 Pressure ulcer of right buttock, stage 2 12/25/2017 12/25/2017 L03.115 Cellulitis of right lower limb 12/25/2017 12/25/2017 L89.152 Pressure ulcer of sacral region, stage 2 12/25/2017 12/25/2017 L89.322 Pressure ulcer of left buttock, stage 2 12/25/2017 12/25/2017 L03.116 Cellulitis of left lower limb 11/23/2018 11/23/2018 Resolved Problems ICD-10 Code Description Active Date Resolved Date L89.152 Pressure ulcer of sacral  region, stage 2 03/31/2019 03/31/2019 L97.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 12/25/2017 12/25/2017 layer exposed L97.322 Non-pressure chronic ulcer of left 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 05/01/2018 05/01/2018 L97.521 Non-pressure chronic ulcer of other part of left foot limited to 11/10/2018 11/10/2018 breakdown of skin Electronic Signature(s) Signed: 08/18/2019 11:35:47 AM By: Lenda Kelp PA-C Entered By: Lenda Kelp on 08/18/2019 11:35:46 -------------------------------------------------------------------------------- Progress Note Details Patient Name: Date of Service: [name], [name] 08/18/2019 10:45 AM Medical Record [id number] Patient Account Number: [id number] Date of Birth/Sex: Treating RN: 1960-12-25 (59 y.o. [name] Primary Care Provider: Orpah Cobb Other Clinician: Referring Provider: Treating Provider/Extender:Stone III, Reginal Lutes, Orlie Pollen in Treatment: 109 Subjective Chief Complaint Information obtained from Patient Patient is here for multiple wounds on his bilateral feet and left ankle. History of Present Illness (HPI) 11/23/15; this is a patient  I remember from a previous stay in this clinic in 2012. The patient says this was for a wound in this same area as currently although I have no information to verify that. Most of the information is provided by his caregiver from the group home where he resides. Apparently he has had a wound in this area for 8 months. He also has erythema around this area and has had multiple rounds of antibiotics apparently with some improvement but the erythema is persists. He apparently had a fracture in the ankle 2 months ago and was seen by Dr. Lajoyce Corners . He apparently had a splint applied and more recently a heel offloading boot. He had home health coming out to a week ago not clear what they are dressing this with but they apparently discharged him perhaps because of one of Dr.  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 He has a Nurse, adult for transferring at home he is nonambulatory and does not wear footwear. He is not a diabetic. Looking through [facility] he appears to have fairly active primary care. The area on the ankle is variably labeled as a pressure area and/or chronic venous insufficiency. He had a recent venous ultrasound on 5/25 that did not show a DVT in the left leg however this was not a reflux study. There was no superficial DVT either. Arterial studies on 10/15/14 showed a left ABI of 1.16 Center right of 1.12 waveforms were triphasic he does not have significant arterial disease. He has had recent x-rays of the left foot and ankle. The left foot did not show any abnormality. Left ankle x-ray showed a spiral fracture of the left tibial diaphysis without evidence of osteomyelitis. 11/30/15 culture I did last week showed pseudomonas I changed him to oral ciprofloxacin. Erythema is a lot better. 12/07/15 area around the wound shows considerable improvement of the periwound erythema. 12/14/15; the patient has a improvement of the periwound erythema which in retrospect I think with cellulitis successfully treated. We have been using Iodoflex started last week 12/25/15 wound on the left medial malleolus and dorsal left foot. Theraskin #1 applied READMISSION 02/27/16; the patient was admitted to hospital from 8/14 through 01/18/16 initially felt to have cellulitis of his left lower leg however he was noted to have significant persistent pain and swelling. A duplex ultrasound was ordered that was negative. Obie Dredge an x-ray was done that showed a spiral fracture of the left leg. With posterior displacement and angulation at the mid left tibia. Orthopedics saw the patient and splinted the leg. He was then sent to [facility] skilled facility and only return to his group home last week. He has wounds on the left medial malleolus,  left dorsal foot and a worrisome area on the plantar left heel which is not really open but may represent a hematoma or a DTI 03/06/16; areas of left anterior and medial ankle both look improved. There are 3 small wounds here. He has a area over the dorsal left first metatarsal head which is not open. In meantime his left heel has opened 03/14/16 the medial left ankle wounds looks stable to improved. The area on the posterior left heel has opened up and had a very adherent eschar and slept over this 03/21/16 patient has 3 wounds on his left medial ankle is stable area on his left dorsal first metatarsal head appears to be improved. Continued problems with adherent eschar over the left heel 03/28/16; patient has 3  wounds on his left medial ankle and lower leg all of these appear to be stable. The problem continues to be the left heel and the adherent eschar. This may have been a pressure ulcer from her brace at one point. He has a English as a second language teacher we have been using [employer] and all wound areas. Dressings are being changed by home health 04/04/16; the patient's areas on the medial aspect of the left ankle looks superficial and appear to be gradually closing. The area on the plantar aspects/Achilles aspect of his left heel as surface slough and nonviable subcutaneous tissue requiring debridement. 04/11/16; the areas on the patient's medial left ankle appear to be superficial and appear to be closing. The area on the plantar heel on the left has surface slough and nonviable subcutaneous tissue requiring debridement. I don't think this is too much different from last week there is also undermining 04/25/16; patient is followed in his group home by [facility] home health. The area on the plantar heel is smaller but with still with some depth. We're using Iodoflex to this. The areas on the left ankle looks healthy but dry and somewhat larger. Cause of this is not really clear using Prisma 05/02/16; areas  x3 all look improved expecially the areas on the medial ankle. Heel wound is smaller but still deep 05/09/16; all areas o3 look improved on the medial ankle. Heel wound is also contracting. 12/0.9/17 the areas on his left medial ankle are all closed. I think these are largely chronic venous insufficiency however there is no edema. The area on his left heel was a pressure area. READMISSION 08/02/16; this man is a patient that we have had in this clinic on several times before. I believe he has cerebral palsy is nonambulatory. He has chronic venous insufficiency with venous inflammation. He was last year in the fall of 2017 with areas on his left medial ankle left heel and left dorsal foot. He arrives today with once again a vague history. As mentioned he is nonambulatory he spends most of his day in a pressure offloading boot nevertheless they noted wounds in this area on the medial left ankle 2 weeks ago. There is any other history here I am uncertain 08/15/16; the patient's wounds on his medial ankle actually look better however the area on the left lateral/dorsal foot is covered in black necrotic eschar. Previous formal arterial studies did not show significant arterial disease with normal ABIs and triphasic waveforms. He is immobilized and at risk for pressure although he seems aware protective boots at all times as far as I am aware. 08/22/16; in general his wounds look better. All the wounds on his medial left ankle look improved the area on his lateral foot however continues to be problematic. Patient comes in the clinic today in a new wheelchair he is quite delighted 09/05/16; a wound on the left medial Calf and left dorsal foot are healed. The area on the left lateral foot is improved however the area on the left medial malleolus is inflamed with a considerable degree of surrounding erythema. This looks like cellulitis. ABIs and clinical exam are within normal limits 09/12/16; the patient  continues to have a wound on the left medial calf. I was concerned about cellulitis in this area last week and gave him a course of doxycycline. The wound looks better here. The area on the left lateral foot however is unchanged 09/20/16; the patient came in this week early after traumatizing his toe on the  left foot on a table while moving around in his motorized wheelchair. 09/26/16- he is here for follow-up evaluation of his left foot and malleolus ulcers. Since his appointment he abraded his left medial hallux against a table while in his motorized wheelchair, apparently trying to pull up to the table for a meal. He continues to complain of tenderness to the left lateral foot, surrounding the current ulcer 10/07/16; left foot and malleolus ulcers. 10/14/16; the patient is doing well. He has a wound on the left lateral, left medial malleolus and atraumatic wound on the left great toe. Although these appear to be superficial and closing 10/31/16; the patient is doing well. The area on the left lateral foot, left medial malleolus and the left great toe or all epithelialized. He has home health 11/14/16; patient arrives today with the area on the left great toe healed. The left lateral foot however in the left medial malleolus and the ankle superficial wounds that are slightly larger. He has erythema and tenderness around the area on the left lateral foot. As well he is complaining of dysuria. His caregiver notes that when he gets like this he is more lethargic which indeed seems to be the case today 11/21/16; left great toe is still healed. His wound on the left lateral foot appears healthy and slightly smaller also the area on the left medial ankle. This had black eschar on it which I removed. The remaining wound looks satisfactory 12/12/16; patient hasn't been here in a number of weeks. The area on the left lateral foot is healed however on his medial ankle has 2 open areas one of them covered with a  necrotic eschar. He also has a new wound on the right second toe which was apparently trauma induced while he was at a movie 12/27/16; the patient has wounds on his left medial mallelus and left lateral foot that are just about closed. The area on the right second toe which was trauma from last time is still open but again everything looks a lot better here. 01/30/17; patient arrives in clinic today and is really been a long time since I've seen him. The wounds are all epithelialized on the left foot this includes his left lateral foot, left medial malleolus. Apparently was in the ER for what of I think was a fungal infection on his buttock's he was prescribed Diflucan and doxycycline I think this is already getting better. READMISSION Avontae is a now 59 year old man who has advanced cerebral palsy and functional quadriplegia. We have had him in this clinic multiple times before for wounds on his right ankle, left ankle, left dorsal foot and most recently from 08/02/16 through 01/30/17 with his left medial ankle and left foot and finally right second toe. His attendant from the group home where he lives is very knowledgeable about him. She states that they noticed breakdown in his skin in his feet sometime in January although they could not get an appointment until today in this clinic. The previous wounds have always been felt to be secondary to incidental trauma/pressure areas or possibly some degree of chronic venous insufficiency. He had normal arterial studies in may of 2016. He has definite open wounds on the right lateral foot over the base of the fifth metatarsal, the right second toe DIP, the left medial malleolus. He has concerning areas over the left lateral fifth metatarsal base, retaform purpura over the medial left ankle and perhaps the medial left first toe. 07/21/17; culture we did  last week from the base of the right fifth metatarsal grew group G strep. This should've been sensitive to the  doxycycline I gave him. The other major wounds are on the right second toe DIP, left medial malleolus, base of the left lateral fifth metatarsal and the medial left ankle and medial left toe. 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 We have macrovascular arterial studies scheduled for Thursday although I don't think this is what the problem is. 07/28/17; patient arrives with really no improvement. He has a wound on the right second toe, lateral right foot, right medial malleolus which was new this week, left medial malleolus, but looked like a DTI on the left great toe. His macrovascular studies are on 07/30/17 although I would be surprised if this provides all the explanation. He has small areas even on his right second and third toe which look like splinter-type hemorrhages. The question I have is where is this coming from. I'd like to see the vascular studies however before we pursue this. He has had a DVT in the past but is no longer on anticoagulants. I'll need to review his history 08/04/17; the patient had his ARTERIAL STUDIES this showed a right ABI of 1.16 and the left ABI of 1.20. TBI on the left at 0.84. He is at a right first toe amputation. The studies were unchanged from studies in May 2016. It was not felt that he had significant lower extremity arterial disease In the meantime we are using silver alginate to the wounds on the right second toe right lateral foot left medial malleolus. I'm going to change to Silver collagen today. 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 Working him up for this would include an echocardiogram probably a CT scan of the chest and abdomen to look at the major thoracic and abdominal aortic vessels. I am not going to involve myself in this unless there is more symptoms/signs 08/18/17; the patient arrives today with  wounds looking somewhat better. The remaining areas are the right second toe dorsal, right lateral foot and left medial malleolus which only has a small open area. We've been using silver collagen 09/01/17;right second toe dorsally probes to bone today. This is a deterioration. We've been using silver collagen. Only a small open area remains on the left medial malleolus. 09/09/17; right second toe appears better. There is no probing bone. He has a small area remaining on the left medial malleolus, the right lateral foot. We have been using silver collagen 10/07/17; the patient returns to clinic today after a month hiatus. He has wounds over the right second toe, right lateral foot and the left medial malleolus. We have been using silver collagen. He has home health. 10/24/17; patient has open wounds over the right second toe, now the left fourth toe and a superficial area over his left buttock. Some of the other areas have healed including the left medial malleolus and right lateral foot. Using silver collagen on these wounds 11/07/17; the patient has closed his wounds on the right second toe and left fourth toe although they look all normal. Last time he was here he had a superficial area over the left buttock this is also closed. The right lateral foot wound I had closed out last time although it is seems to have reopened today. 628/19 the areas over the right second and left fourth toe remains closed.  The right lateral foot has tightly adherent necrotic surface over. We have been using silver collagen he offloads this and a bunny boot 12/12/17; the patient has reopened the recurrent area over the right second PIP joint. 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 He does not have obvious macrovascular disease. The area over the right lateral foot looks about the same as last time tightly adherent necrotic debris. We've been using Hydrofera  Blue 12/25/17; 2 week follow-up. The area over the right second PIP is closed however the area on the right lateral foot is worse with surrounding erythema and pain compatible with cellulitis is going to need an antibiotic. ooAS WELL..... We have looked at his buttocks and lower back. He has 3 stage II ulcers. The exact history here is unclear however he is very disabled man with cerebral palsy. He goes to a day program from the group home in which she lives 5 days a week and I think he is on his buttock all day on these visits. Surrounding the wounds see has extensive stage I injury as well. ooFINALLY it looks as though he has lost weight. I'm not the only staff member that is recognized this 01/08/18 on evaluation today patient appears to be doing better in regard to his gluteal wounds. In fact everything appears to be healed back here although very dry. He has a couple open sores on the scrotal region other than this it is really his foot that is still giving him the most trouble. He did complete the Keflex unfortunately it seems like there's still a lot of erythema in this in fact looks worse than it did previous. I'm gonna culture this today and likely start with a different antibiotic. 01/16/18; I was reassured that the wounds on his buttocks and sacrum are healed. I did not look at these the day. The culture that was done last week on 01/11/18 of the right lateral foot wounds showed Pseudomonas. Bactrim is not a good choice for this. Ciprofloxacin interacts with his muscle relaxants therefore I prescribed cefdinir 300 twice a day for 7 days. Bactrim coverage of pseudomonas is not reliable although the degree of erythema looks a lot better 01/30/18; the patient has completed his antibiotics. The area does not have infection on the right lateral foot. The wounds look a lot better. We've been using silver alginate 02/12/18; there is on the right lateral foot. Copious amounts of surface eschar. Also  similarly over the PIP of the right second toe. We've been using silver alginate to date 03/02/2018; the area that remains is on the right lateral foot. The right second toe is healed. 03/16/2018; the area there is original wound on the right lateral foot. Still requiring debridement. We are using Hydrofera Blue. The right second toe is still healed/closed 03/30/2018; right lateral foot there is 2 small open areas remaining. They actually look quite healthy we have been using Hydrofera Blue the right second toe remains closed 04/13/2018; right lateral foot. 1 of the 2 small open areas from last time has closed over. The other had surface slough that I removed with a #3 curette. This looks quite healthy. The right second toe remains closed. We are using Hydrofera Blue 05/01/2018; right lateral foot very superficial areas that look like they are trying to close over. He had a new threatened area on the left medial ankle just below the medial malleolus. I think these represent chronic  venous insufficiency areas 05/14/2018; the right right lateral foot looks like it closed over as was predicted last time however he has some dark areas subcutaneously erythema superiorly and this is really very tender. This is either coexistent cellulitis or perhaps a deep tissue injury. The area on the left medial ankle is superficial but has some size. We have been using Hydrofera Blue to both areas. The patient complains of pain in his right foot 06/09/2018; right lateral foot was closed over last time but there was coexistent cellulitis around this. I gave him Levaquin. He also has an area on the left medial malleolus. He has not been back to clinic in over 3-1/2 weeks. We are using Hydrofera Blue I think to the left medial malleolus and silver alginate to the right lateral foot 1/21; right lateral foot still non-viable debris over this area and the left medial malleolus. Both of these vigorously washed off with  antiseptic gauze. We have been using silver alginate. 2/4; right lateral foot this looks somewhat better. Unfortunately the area over the left medial malleolus has deteriorated. Using silver alginate in both area 2/18; both wounds arrived today covered in a lot of necrotic debris. We have been using silver alginate. Clearly not making a lot of progress. 3/3; the patient's right second toe is reopened over the PIP. I am not sure why this is happening. He may have microvascular disease. We have checked his macrovascular flow in the past and found it to be normal. He may need an x-ray. The area on the right lateral foot requires debridement. The area on the left lateral ankle is worse. We have been using PolyMem Ag 08/26/18 on evaluation today patient appears to be doing a little worse in regard to his lower extremities based on what I'm seeing what the nursing staff is telling me. His left medial ankle appears potential to be infected there is your thing on want surrounding the wound. His right second toe is also of concern at this point in my pinion. We have not done x-rays of these areas that may be a good idea to go ahead and proceed with at this point to ensure will not deal with anything such as osteomyelitis. 4/16; it is been a while since I saw this patient. He now has wounds on the right lateral foot, dorsal right second toe, substantially on the left medial malleolus and a small area on the left lateral foot. We have been using PolyMem Silver. Curlex and Coban. He has home health changing the dressing. Since last time he was here he had a culture of the left medial ankle that showed MRSA. He is completed a course/7 days of doxycycline. XRAYS of the left ankle and right foot that were ordered were negative for osteomyelitis 4/30; he arrives in clinic today with some maceration around the wound and a lot of tenderness and erythema in a circumferential area around the right lateral foot. He  has wound areas on the right lateral foot dorsal right second toe, largely over the left medial malleolus and a small area over the left lateral foot 5/7; completed his antibiotics from last time right lateral foot cellulitis is better and the area on his left knee which we discovered after he had been seen by the provider also is improved. He still has extensive wound areas over the right lateral foot, left medial malleolus, right second toe and left lateral foot although all of these look somewhat better with PolyMem 5/15. Wounds on  the right lateral foot, right second toe left medial malleolus and the left lateral foot. All of these look somewhat better. We have been using PolyMem Ag 5/26; the wound on the right lateral foot is almost closed over but he still complains of a lot of pain in this area. He has a eschared area on the right second toe but no open wound.Geoffry [place] concerning today his original wound on the left medial malleolus looks healthy however just inferior to this almost areas of deep tissue injury and there is an area on the left lateral foot also looks like a deep tissue injury. In the past I have wondered whether he might have cholesterol emboli or some other form of a vasculopathy. I wonder whether this could have something to do with this. 6/2; right lateral foot wound is still open I gave him empiric antibiotics last week without really a lot of change she is still complaining of pain in this area. We x-rayed his feet at some point in April these were negative. It would be difficult to do an MRI. The area on his left medial malleolus looks better. Right second toe is healed. He still has what looks to be a small pressure related area on the left lateral foot but it is not open. We are using PolyMem to all wound which seems to be doing nicely 6/16; right lateral foot wound is still open. The wound looks better although there is still a lot of tenderness around it. Rather  than more empiric antibiotics I have cultured this today. We are using PolyMem Ag. On the left lateral malleolus the wound appears better. He has a new area on the left lower anterior pretibial area 6/29; 2-week follow-up. Culture I did of the right lateral foot 2 weeks ago showed MRSA. I gave him 2 weeks of Septra which he should be just about finishing now and this seems better. We got a call from home health last week to report swelling in the left leg because we were listed as his primary. We referred him back to primary care. He arrives today with intense erythema and tenderness around the 2 wounds on the left medial ankle on the left lower calf. The area on the right lateral foot looks better 7/7; I brought him back this week to follow-up on his left foot which had cellulitis last week. I gave him clindamycin which as far as I know he tolerated. The erythema is better. He has a 3 current wounds one on the right lateral foot a small area on the right lateral malleolus and the more recent area on the left anterior pretibial area. 7/21; bilateral distal lower leg and foot wounds. The area on his right lateral foot looks a lot better. He has an area on his left pretibial area distally. The area on the medial malleolus on the left which is 1 of his worst wounds has closed over. He does not appear to have any open areas in his remaining toes 8/4-Patient returns at 2 weeks, he has the remaining wound on the left lower leg the other 2 wounds have healed. Patient is continued to be treated with silver alginate with a 2 layer wrap on the left 8/18; I been following this patient for a long period for bilateral lower extremity wounds. 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 His wounds are probably pressure although he religiously wears thick bunny boots. He does not have macrovascular  disease but I did formal testing on him perhaps 2 or 3 years ago. I  have often wondered about either cholesterol emboli or a vasculopathy of some sort given the distal nature of these variable locations etc. He has 2 areas that we have been following one on the left medial lower leg and one on the right lateral foot. Both of these are mostly healed today there is some eschar over sections of both areas although I did not disturb this. 03/31/2019 on evaluation today patient appears to be doing a little bit more poorly in regard to the sacral region. He has 2 small wounds noted at this point. Fortunately there is no signs of active infection at this time. No fevers, chills, nausea, vomiting, or diarrhea. He is having some pain when he sits on his gluteal region a big part of the reason he has these wounds is likely due to the fact that his wheelchair has not been functioning properly. He does still have a couple areas of deep tissue injury on his lower extremities he does need new Prevalon offloading boots as well. 04/28/2019 on evaluation today patient appears to be doing well with regard to his wounds in particular. His ankle area has fairly small based on what I am seeing currently and his sacral region actually is still open but does not appear to be doing too poorly in my opinion. Fortunately there is no signs of active infection at this time. He was in the hospital unfortunately and this was from 04/08/2019 through 04/12/2019 secondary to persistent diarrhea, dehydration and acute kidney injury. With that being said fortunately the patient does not seem to be doing too poorly at this time which is good news. He is having pain mainly in the sacral region. 06/23/2019 on evaluation today patient appears to be doing well with regard to his sacral region which in fact is completely healed. Unfortunately he has an area of rubbing/pressure on his right lateral chest/back region. This is where it is rubbing up on his chair. Subsequently he also has an open wound on the  left medial malleolus which is not very open but starting to crack and then on the right lateral foot this is much more open at this point. Unfortunately there is no signs of significant improvement in regard to these areas and in fact there does appear to be evidence of active infection quite significantly. No fevers, chills, nausea, vomiting, or diarrhea. The patient did test positive for COVID-19 that has been greater than 14 days ago although they did not know the exact timing. That is why he was not here for his last appointment. 07/07/2019 upon evaluation today patient actually appears to be doing quite well in regard to his left ankle as well as the chest/back region which is doing great in fact appears to be healed in my opinion. He still has an issue on his lateral right foot that is open and appears to be infected but does have me more concerned. In fact I do believe the doxycycline helped in general for the cellulitis but I believe that may have been more MRSA related based on the culture that we did. What I am seeing right now has more of the blue-green drainage and may be more consistent with a Pseudomonas that I was hoping was not really causing much of an issue but I think it may be at this point on the right lateral foot. For that reason I do think treatment would be  a good idea of the reason I avoided the Cipro before was the potential for QT prolongation which could obviously cause problems with the patient with results of interaction with respiratory wound. The tizanidine also had some interactions as well. Nonetheless I think at this point that it would be a possibility for [place] to use topical gentamicin to see if this could be of benefit for the patient underneath the silver alginate dressing. 07/21/2019 upon evaluation today patient appears to be doing fairly well at this point in regard to his lower extremity on the right. He does have a wound on his right lateral foot as well as  the right fifth toe and the right fourth toe. Unfortunately he continues to experience significant issues with pressure and trauma to some degree and I think this is just due to the fact that again he is not really not able to control his legs and what is happening here. Fortunately there is no signs of active infection at this time. 08/04/2019 upon evaluation today patient actually appears to be doing quite well with regard to his toe ulcers I am very pleased in that regard. Unfortunately the foot ulcer is showing some signs of erythema his primary care provider has placed him on clindamycin at this point. Again I do believe this can be beneficial for him. With that being said I do think the patient may benefit from a light Kerlix and Coban wrap to help with some of his edema at this point. 08/18/2019 upon evaluation today patient appears to be doing still poorly in regard to his lateral foot ulcer. This is not very deep but there is some concern about the possibility of osteomyelitis. I think it would be appropriate for [place] to go ahead and have an x-ray at this time. The patient is currently on clindamycin which from his culture which was performed on 06/23/2019 it does appear that he would benefit from that in regard to the MRSA. However Pseudomonas was also identified then and the patient actually has some blue-green drainage at this point as well. I am much more concerned about the possibility of the Pseudomonas being an infection is causative agent at this point that I am the MRSA. Subsequently Cipro the patient was sensitive to but we never use that is stable use a topical gent due to the fact that unfortunately there was some interaction between the Cipro and the patient's risk for down potentially causing QT prolongation. Objective Constitutional Well-nourished and well-hydrated in no acute distress. Vitals Time Taken: 11:04 AM, Height: 73 in, Weight: 178 lbs, BMI: 23.5, Temperature: 98.3  F, Pulse: 83 bpm, Respiratory Rate: 20 breaths/min, Blood Pressure: 145/82 mmHg. Respiratory normal breathing without difficulty. Psychiatric this patient is able to make decisions and demonstrates good insight into disease process. Alert and Oriented x 3. pleasant and cooperative. General Notes: Upon inspection patient's wound again showed signs of erythema surrounding the wound bed there is definitely cellulitis noted I am not really sure the clindamycin is helping and to be honest this may be due to the fact that Pseudomonas is likely the causative agent secondary to the fact that the patient has significant blue-green drainage consistent with Pseudomonas upon inspection today. I am going obtain a new culture as the other one was back in January to see where things stand that is been almost 2 months back. Integumentary (Hair, Skin) Wound #37 status is Open. Original cause of wound was Pressure Injury. The wound is located on the Right,Lateral  Foot. The wound measures 4cm length x 2.5cm width x 0.2cm depth; 7.854cm^2 area and 1.571cm^3 volume. There is Fat Layer (Subcutaneous Tissue) Exposed exposed. There is no tunneling or undermining noted. There is a large amount of purulent drainage noted. The wound margin is flat and intact. There is medium (34-66%) pink granulation within the wound bed. There is a medium (34-66%) amount of necrotic tissue within the wound bed including Adherent Slough. Wound #38 status is Open. Original cause of wound was Shear/Friction. The wound is located on the Right Toe Fifth. The wound measures 0.3cm length x 0.6cm width x 0.2cm depth; 0.141cm^2 area and 0.028cm^3 volume. There is no tunneling or undermining noted. There is a medium amount of serosanguineous drainage noted. The wound margin is distinct with the outline attached to the wound base. There is large (67-100%) red granulation within the wound bed. There is a small (1-33%) amount of necrotic tissue  within the wound bed including Adherent Slough. Wound #39 status is Healed - Epithelialized. Original cause of wound was Trauma. The wound is located on the Right Toe Fourth. The wound measures 0cm length x 0cm width x 0cm depth; 0cm^2 area and 0cm^3 volume. Assessment Active Problems ICD-10 Pressure ulcer of left ankle, stage 2 Pressure ulcer of other site, stage 2 Pressure ulcer of other site, stage 3 Other specified peripheral vascular diseases Procedures Wound #37 Pre-procedure diagnosis of Wound #37 is a Pressure Ulcer located on the Right,Lateral Foot . There was a Chemical/Enzymatic/Mechanical debridement performed by [name], RN.Marland Kitchen Agent used was [employer]. A time out was conducted at 12:07, prior to the start of the procedure. There was no bleeding. The procedure was tolerated well. Post Debridement Measurements: 4cm length x 2.5cm width x 0.2cm depth; 1.571cm^3 volume. Post debridement Stage noted as Category/Stage III. Character of Wound/Ulcer Post Debridement requires further debridement. Post procedure Diagnosis Wound #37: Same as Pre-Procedure Plan Follow-up Appointments: Return Appointment in 2 weeks. Dressing Change Frequency: Wound #37 Right,Lateral Foot: Change dressing three times week. Wound #38 Right Toe Fifth: Change dressing three times week. Skin Barriers/Peri-Wound Care: Barrier cream - to sacrum as needed Moisturizing lotion - both legs daily Wound Cleansing: Wound #37 Right,Lateral Foot: May shower and wash wound with soap and water. - with dressing changes Wound #38 Right Toe Fifth: May shower and wash wound with soap and water. - with dressing changes Primary Wound Dressing: Wound #37 Right,Lateral Foot: Santyl Ointment Wound #38 Right Toe Fifth: Calcium Alginate with Silver Secondary Dressing: Wound #37 Right,Lateral Foot: Kerlix/Rolled Gauze Dry Gauze Wound #38 Right Toe Fifth: Kerlix/Rolled Gauze Dry Gauze Edema Control: Kerlix and  Coban - Right Lower Extremity - wrap from base of toes to tibial tuberosity Elevate legs to the level of the heart or above for 30 minutes daily and/or when sitting, a frequency of: Support Garment 10-20 mm/Hg pressure to: - support hose both legs daily Off-Loading: Multipodus Splint to: - sage boots both feet at all times. please float feet to offload pressure to lateral side of feet. Gel mattress overlay (Group 1) - for extended mattress/bed Turn and reposition every 2 hours Home Health: Continue Home Health skilled nursing for wound care. - [facility] Laboratory ordered were: Anerobic culture - right lat foot Radiology ordered were: X-ray, foot right complete view - nonhealing ulcer right lateral foot CPT 73630 1. My suggestion at this point is can be that we go ahead and continue with the Santyl again I do not think that is a bad option for  the patient to be honest but again that was using topical gentamicin due to the fact that I really could not otherwise treat the infection other than with the topical gentamicin cream. 2. I still think that potentially the patient will benefit from Levaquin or Cipro although there is an interaction with his respiratory and potentially causing QT prolongation which does have me concerned. 3. I am also going to recommend at this point that the patient continue with dressing changes daily which I think is appropriate. 4. I am get a recommend a x-ray of the right foot to evaluate for any signs of osteomyelitis. 5. Depending on the results of the patient's culture he may warrant an evaluation with infectious disease to see if IV antibiotics would be warranted or if they felt comfortable prescribing oral fluoroquinolones for the patient. We will see patient back for reevaluation in 1 week here in the clinic. If anything worsens or changes patient will contact our office for additional recommendations. Electronic Signature(s) Signed: 08/18/2019 12:13:25 PM By:  Lenda Kelp PA-C Previous Signature: 08/18/2019 12:11:09 PM Version By: Lenda Kelp PA-C Entered By: Lenda Kelp on 08/18/2019 12:13:25 -------------------------------------------------------------------------------- SuperBill Details Patient Name: Date of Service: [name], [name] 08/18/2019 Medical Record [id number] Patient Account Number: [id number] Date of Birth/Sex: Treating RN: [id number] (58 y.o. [name] Primary Care Provider: Orpah Cobb Other Clinician: Referring Provider: Treating Provider/Extender:Stone III, Reginal Lutes, Orlie Pollen in Treatment: 109 Diagnosis Coding ICD-10 Codes Code Description [id number] Pressure ulcer of left ankle, stage 2 L89.892 Pressure ulcer of other site, stage 2 L89.893 Pressure ulcer of other site, stage 3 I73.89 Other specified peripheral vascular diseases Facility Procedures CPT4 Code: [id number] Description: [id number] - DEBRIDE W/O ANES NON SELECT Modifier: Quantity: 1 Physician Procedures CPT4 Code: [id number] Description: 99214 - WC PHYS LEVEL 4 - EST PT ICD-10 Diagnosis Description L89.522 Pressure ulcer of left ankle, stage 2 L89.892 Pressure ulcer of other site, stage 2 L89.893 Pressure ulcer of other site, stage 3 I73.89 Other specified peripheral  vascular diseases Modifier: Quantity: 1 Electronic Signature(s) Signed: 08/18/2019 12:13:38 PM By: Lenda Kelp PA-C Entered By: Lenda Kelp on 08/18/2019 12:13:37

## 2019-08-20 LAB — AEROBIC CULTURE W GRAM STAIN (SUPERFICIAL SPECIMEN)

## 2019-08-24 ENCOUNTER — Other Ambulatory Visit: Payer: Self-pay | Admitting: Physician Assistant

## 2019-08-24 ENCOUNTER — Ambulatory Visit
Admission: RE | Admit: 2019-08-24 | Discharge: 2019-08-24 | Disposition: A | Discharge status: Home / Self Care | Payer: Medicare Other | Source: Ambulatory Visit | Attending: Physician Assistant | Admitting: Physician Assistant

## 2019-08-24 DIAGNOSIS — L97519 Non-pressure chronic ulcer of other part of right foot with unspecified severity: Secondary | ICD-10-CM

## 2019-08-31 ENCOUNTER — Other Ambulatory Visit: Payer: Self-pay

## 2019-08-31 ENCOUNTER — Encounter (HOSPITAL_BASED_OUTPATIENT_CLINIC_OR_DEPARTMENT_OTHER): Payer: Medicare Other | Attending: Internal Medicine | Admitting: Internal Medicine

## 2019-08-31 DIAGNOSIS — I739 Peripheral vascular disease, unspecified: Secondary | ICD-10-CM | POA: Insufficient documentation

## 2019-08-31 DIAGNOSIS — L89522 Pressure ulcer of left ankle, stage 2: Secondary | ICD-10-CM | POA: Insufficient documentation

## 2019-08-31 DIAGNOSIS — L03115 Cellulitis of right lower limb: Secondary | ICD-10-CM | POA: Insufficient documentation

## 2019-08-31 DIAGNOSIS — G808 Other cerebral palsy: Secondary | ICD-10-CM | POA: Insufficient documentation

## 2019-08-31 DIAGNOSIS — L89892 Pressure ulcer of other site, stage 2: Secondary | ICD-10-CM | POA: Insufficient documentation

## 2019-08-31 DIAGNOSIS — L89893 Pressure ulcer of other site, stage 3: Secondary | ICD-10-CM | POA: Insufficient documentation

## 2019-09-01 NOTE — Progress Notes (Signed)
[name], [name] ([id number]) Visit Report for 08/31/2019 HPI Details Patient Name: Date of Service: [name], [name] 08/31/2019 9:45 AM Medical Record [id number] Patient Account Number: [id number] Date of Birth/Sex: Treating RN: 03-Dec-1960 (58 y.o. [name]) Carlene Coria Primary Care Provider: [name] Other Clinician: Referring Provider: Treating Provider/Extender:Dominiq Fontaine, Providence Lanius, Randon Goldsmith in Treatment: 111 History of Present Illness HPI Description: 11/23/15; this is a patient I remember from a previous stay in this clinic in 2012. The patient says this was for a wound in this same area as currently although I have no information to verify that. Most of the information is provided by his caregiver from the group home where he resides. Apparently he has had a wound in this area for 8 months. He also has erythema around this area and has had multiple rounds of antibiotics apparently with some improvement but the erythema is persists. He apparently had a fracture in the ankle 2 months ago and was seen by Dr. Sharol Given . He apparently had a splint applied and more recently a heel offloading boot. He had home health coming out to a week ago not clear what they are dressing this with but they apparently discharged him perhaps because of one of Dr. 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 He is not a diabetic. Looking through [facility] he appears to have fairly active primary care. The area on the ankle is variably labeled as a pressure area and/or chronic venous insufficiency. He had a recent venous ultrasound on 5/25 that did not show a DVT in the left leg however this was not a reflux study. There was no superficial DVT either. Arterial studies on 10/15/14 showed a left ABI of 1.16 Center right of 1.12 waveforms were triphasic he does not  have significant arterial disease. He has had recent x-rays of the left foot and ankle. The left foot did not show any abnormality. Left ankle x-ray showed a spiral fracture of the left tibial diaphysis without evidence of osteomyelitis. 11/30/15 culture I did last week showed pseudomonas I changed him to oral ciprofloxacin. Erythema is a lot better. 12/07/15 area around the wound shows considerable improvement of the periwound erythema. 12/14/15; the patient has a improvement of the periwound erythema which in retrospect I think with cellulitis successfully treated. We have been using Iodoflex started last week 12/25/15 wound on the left medial malleolus and dorsal left foot. Theraskin #1 applied READMISSION 02/27/16; the patient was admitted to hospital from 8/14 through 01/18/16 initially felt to have cellulitis of his left lower leg however he was noted to have significant persistent pain and swelling. A duplex ultrasound was ordered that was negative. Bland Span an x-ray was done that showed a spiral fracture of the left leg. With posterior displacement and angulation at the mid left tibia. Orthopedics saw the patient and splinted the leg. He was then sent to [facility] skilled facility and only return to his group home last week. He has wounds on the left medial malleolus, left dorsal foot and a worrisome area on the plantar left heel which is not really open but may represent a hematoma or a DTI 03/06/16; areas of left anterior and medial ankle both look improved. There are 3 small wounds here. He has a area over the dorsal left first metatarsal head which is not open. In meantime his left heel has opened  stage 2 12/25/2017 12/25/2017 L03.116 Cellulitis of left lower limb 11/23/2018 11/23/2018 Resolved Problems ICD-10 Code Description Active Date Resolved Date L89.152 Pressure ulcer of sacral region, stage 2 03/31/2019 03/31/2019 L97.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 12/25/2017 12/25/2017 layer exposed L97.322 Non-pressure chronic ulcer of left 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 05/01/2018 05/01/2018 L97.521 Non-pressure chronic ulcer of other part of left foot limited to 11/10/2018 11/10/2018 breakdown of skin Electronic Signature(s) Signed: 08/31/2019 6:13:52 PM By: Baltazar Najjarobson, Raimi Guillermo MD Entered By: Baltazar Najjarobson, Ashauna Bertholf on 08/31/2019 11:41:11 -------------------------------------------------------------------------------- Progress Note Details Patient Name: Date of Service: [name] RhineHUMBLE, [name] 08/31/2019 9:45 AM Medical Record [id number] Patient Account Number: [id number] Date of Birth/Sex: Treating RN: [birth year] (58 y.o. Melonie FloridaM) Epps, Carrie Primary Care Provider: Orpah CobbMULLIS, KIERSTEN Other Clinician: Referring Provider: Treating Provider/Extender:Nehemyah Foushee, Dorna LeitzMichael MULLIS, Orlie PollenKIERSTEN Weeks in Treatment: 111 Subjective History of Present Illness (HPI) 11/23/15; this is a patient I remember from a previous stay in this clinic in 2012. The patient says this was for a wound in this same area as currently although I have no information to verify that. Most of the information is provided by his caregiver from the group home where he resides. Apparently he has had a wound in this area for 8 months. He also has erythema around this area and has had multiple rounds  of antibiotics apparently with some improvement but the erythema is persists. He apparently had a fracture in the ankle 2 months ago and was seen by Dr. Lajoyce Cornersuda . He apparently had a splint applied and more recently a heel offloading boot. He had home health coming out to a week ago not clear what they are dressing this with but they apparently discharged him perhaps because of one of Dr. 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 He is not a diabetic. Looking through [facility] he appears to have fairly active primary care. The area on the ankle is variably labeled as a pressure area and/or chronic venous insufficiency. He had a recent venous ultrasound on 5/25 that did not show a DVT in the left leg however this was not a reflux study. There was no superficial DVT either. Arterial studies on 10/15/14 showed a left ABI of 1.16 Center right of 1.12 waveforms were triphasic he does not have significant arterial disease. He has had recent x-rays of the left foot and ankle. The left foot did not show any abnormality. Left ankle x-ray showed a spiral fracture of the left tibial diaphysis without evidence of osteomyelitis. 11/30/15 culture I did last week showed pseudomonas I changed him to oral ciprofloxacin. Erythema is a lot better. 12/07/15 area around the wound shows considerable improvement of the periwound erythema. 12/14/15; the patient has a improvement of the periwound erythema which in retrospect I think with cellulitis successfully treated. We have been using Iodoflex started last week 12/25/15 wound on the left medial malleolus and dorsal left foot. Theraskin #1 applied READMISSION 02/27/16; the patient was admitted to hospital from 8/14 through 01/18/16 initially felt to have cellulitis of his left lower leg however he was noted to have significant persistent pain  and swelling. A duplex ultrasound was ordered that was negative. Obie DredgeLangley an x-ray was done that showed a spiral fracture of the left leg. With posterior displacement and angulation at the mid left tibia. Orthopedics saw the patient and splinted the leg. He  [name], [name] ([id number]) Visit Report for 08/31/2019 HPI Details Patient Name: Date of Service: [name], [name] 08/31/2019 9:45 AM Medical Record [id number] Patient Account Number: [id number] Date of Birth/Sex: Treating RN: 03-Dec-1960 (58 y.o. [name]) Carlene Coria Primary Care Provider: [name] Other Clinician: Referring Provider: Treating Provider/Extender:Dominiq Fontaine, Providence Lanius, Randon Goldsmith in Treatment: 111 History of Present Illness HPI Description: 11/23/15; this is a patient I remember from a previous stay in this clinic in 2012. The patient says this was for a wound in this same area as currently although I have no information to verify that. Most of the information is provided by his caregiver from the group home where he resides. Apparently he has had a wound in this area for 8 months. He also has erythema around this area and has had multiple rounds of antibiotics apparently with some improvement but the erythema is persists. He apparently had a fracture in the ankle 2 months ago and was seen by Dr. Sharol Given . He apparently had a splint applied and more recently a heel offloading boot. He had home health coming out to a week ago not clear what they are dressing this with but they apparently discharged him perhaps because of one of Dr. 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 He is not a diabetic. Looking through [facility] he appears to have fairly active primary care. The area on the ankle is variably labeled as a pressure area and/or chronic venous insufficiency. He had a recent venous ultrasound on 5/25 that did not show a DVT in the left leg however this was not a reflux study. There was no superficial DVT either. Arterial studies on 10/15/14 showed a left ABI of 1.16 Center right of 1.12 waveforms were triphasic he does not  have significant arterial disease. He has had recent x-rays of the left foot and ankle. The left foot did not show any abnormality. Left ankle x-ray showed a spiral fracture of the left tibial diaphysis without evidence of osteomyelitis. 11/30/15 culture I did last week showed pseudomonas I changed him to oral ciprofloxacin. Erythema is a lot better. 12/07/15 area around the wound shows considerable improvement of the periwound erythema. 12/14/15; the patient has a improvement of the periwound erythema which in retrospect I think with cellulitis successfully treated. We have been using Iodoflex started last week 12/25/15 wound on the left medial malleolus and dorsal left foot. Theraskin #1 applied READMISSION 02/27/16; the patient was admitted to hospital from 8/14 through 01/18/16 initially felt to have cellulitis of his left lower leg however he was noted to have significant persistent pain and swelling. A duplex ultrasound was ordered that was negative. Bland Span an x-ray was done that showed a spiral fracture of the left leg. With posterior displacement and angulation at the mid left tibia. Orthopedics saw the patient and splinted the leg. He was then sent to [facility] skilled facility and only return to his group home last week. He has wounds on the left medial malleolus, left dorsal foot and a worrisome area on the plantar left heel which is not really open but may represent a hematoma or a DTI 03/06/16; areas of left anterior and medial ankle both look improved. There are 3 small wounds here. He has a area over the dorsal left first metatarsal head which is not open. In meantime his left heel has opened  stage 2 12/25/2017 12/25/2017 L03.116 Cellulitis of left lower limb 11/23/2018 11/23/2018 Resolved Problems ICD-10 Code Description Active Date Resolved Date L89.152 Pressure ulcer of sacral region, stage 2 03/31/2019 03/31/2019 L97.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 12/25/2017 12/25/2017 layer exposed L97.322 Non-pressure chronic ulcer of left 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 05/01/2018 05/01/2018 L97.521 Non-pressure chronic ulcer of other part of left foot limited to 11/10/2018 11/10/2018 breakdown of skin Electronic Signature(s) Signed: 08/31/2019 6:13:52 PM By: Baltazar Najjarobson, Raimi Guillermo MD Entered By: Baltazar Najjarobson, Ashauna Bertholf on 08/31/2019 11:41:11 -------------------------------------------------------------------------------- Progress Note Details Patient Name: Date of Service: [name] RhineHUMBLE, [name] 08/31/2019 9:45 AM Medical Record [id number] Patient Account Number: [id number] Date of Birth/Sex: Treating RN: [birth year] (58 y.o. Melonie FloridaM) Epps, Carrie Primary Care Provider: Orpah CobbMULLIS, KIERSTEN Other Clinician: Referring Provider: Treating Provider/Extender:Nehemyah Foushee, Dorna LeitzMichael MULLIS, Orlie PollenKIERSTEN Weeks in Treatment: 111 Subjective History of Present Illness (HPI) 11/23/15; this is a patient I remember from a previous stay in this clinic in 2012. The patient says this was for a wound in this same area as currently although I have no information to verify that. Most of the information is provided by his caregiver from the group home where he resides. Apparently he has had a wound in this area for 8 months. He also has erythema around this area and has had multiple rounds  of antibiotics apparently with some improvement but the erythema is persists. He apparently had a fracture in the ankle 2 months ago and was seen by Dr. Lajoyce Cornersuda . He apparently had a splint applied and more recently a heel offloading boot. He had home health coming out to a week ago not clear what they are dressing this with but they apparently discharged him perhaps because of one of Dr. 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 He is not a diabetic. Looking through [facility] he appears to have fairly active primary care. The area on the ankle is variably labeled as a pressure area and/or chronic venous insufficiency. He had a recent venous ultrasound on 5/25 that did not show a DVT in the left leg however this was not a reflux study. There was no superficial DVT either. Arterial studies on 10/15/14 showed a left ABI of 1.16 Center right of 1.12 waveforms were triphasic he does not have significant arterial disease. He has had recent x-rays of the left foot and ankle. The left foot did not show any abnormality. Left ankle x-ray showed a spiral fracture of the left tibial diaphysis without evidence of osteomyelitis. 11/30/15 culture I did last week showed pseudomonas I changed him to oral ciprofloxacin. Erythema is a lot better. 12/07/15 area around the wound shows considerable improvement of the periwound erythema. 12/14/15; the patient has a improvement of the periwound erythema which in retrospect I think with cellulitis successfully treated. We have been using Iodoflex started last week 12/25/15 wound on the left medial malleolus and dorsal left foot. Theraskin #1 applied READMISSION 02/27/16; the patient was admitted to hospital from 8/14 through 01/18/16 initially felt to have cellulitis of his left lower leg however he was noted to have significant persistent pain  and swelling. A duplex ultrasound was ordered that was negative. Obie DredgeLangley an x-ray was done that showed a spiral fracture of the left leg. With posterior displacement and angulation at the mid left tibia. Orthopedics saw the patient and splinted the leg. He  [name], [name] ([id number]) Visit Report for 08/31/2019 HPI Details Patient Name: Date of Service: [name], [name] 08/31/2019 9:45 AM Medical Record [id number] Patient Account Number: [id number] Date of Birth/Sex: Treating RN: 03-Dec-1960 (58 y.o. [name]) Carlene Coria Primary Care Provider: [name] Other Clinician: Referring Provider: Treating Provider/Extender:Dominiq Fontaine, Providence Lanius, Randon Goldsmith in Treatment: 111 History of Present Illness HPI Description: 11/23/15; this is a patient I remember from a previous stay in this clinic in 2012. The patient says this was for a wound in this same area as currently although I have no information to verify that. Most of the information is provided by his caregiver from the group home where he resides. Apparently he has had a wound in this area for 8 months. He also has erythema around this area and has had multiple rounds of antibiotics apparently with some improvement but the erythema is persists. He apparently had a fracture in the ankle 2 months ago and was seen by Dr. Sharol Given . He apparently had a splint applied and more recently a heel offloading boot. He had home health coming out to a week ago not clear what they are dressing this with but they apparently discharged him perhaps because of one of Dr. 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 He is not a diabetic. Looking through [facility] he appears to have fairly active primary care. The area on the ankle is variably labeled as a pressure area and/or chronic venous insufficiency. He had a recent venous ultrasound on 5/25 that did not show a DVT in the left leg however this was not a reflux study. There was no superficial DVT either. Arterial studies on 10/15/14 showed a left ABI of 1.16 Center right of 1.12 waveforms were triphasic he does not  have significant arterial disease. He has had recent x-rays of the left foot and ankle. The left foot did not show any abnormality. Left ankle x-ray showed a spiral fracture of the left tibial diaphysis without evidence of osteomyelitis. 11/30/15 culture I did last week showed pseudomonas I changed him to oral ciprofloxacin. Erythema is a lot better. 12/07/15 area around the wound shows considerable improvement of the periwound erythema. 12/14/15; the patient has a improvement of the periwound erythema which in retrospect I think with cellulitis successfully treated. We have been using Iodoflex started last week 12/25/15 wound on the left medial malleolus and dorsal left foot. Theraskin #1 applied READMISSION 02/27/16; the patient was admitted to hospital from 8/14 through 01/18/16 initially felt to have cellulitis of his left lower leg however he was noted to have significant persistent pain and swelling. A duplex ultrasound was ordered that was negative. Bland Span an x-ray was done that showed a spiral fracture of the left leg. With posterior displacement and angulation at the mid left tibia. Orthopedics saw the patient and splinted the leg. He was then sent to [facility] skilled facility and only return to his group home last week. He has wounds on the left medial malleolus, left dorsal foot and a worrisome area on the plantar left heel which is not really open but may represent a hematoma or a DTI 03/06/16; areas of left anterior and medial ankle both look improved. There are 3 small wounds here. He has a area over the dorsal left first metatarsal head which is not open. In meantime his left heel has opened  Record [id number] Patient Account Number: [id number] Date of Birth/Sex: Treating  RN: 1960/10/28 (58 y.o. [name]) [name] Primary Care Provider: Orpah Cobb Other Clinician: Referring Provider: Treating Provider/Extender:Kennan Detter, Dorna Leitz, Orlie Pollen in Treatment: 111 Constitutional Sitting or standing Blood Pressure is within target range for patient.. Pulse regular and within target range for patient.Marland Kitchen Respirations regular, non-labored and within target range.. Temperature is normal and within the target range for the patient.Marland Kitchen Appears in no distress. Cardiovascular Pedal pulses are palpable. Notes Wound exam; the patient's wound is on the right lateral foot. Fairly substantial. A lot of weeping easily friable tissue. There is still some erythema around the wound that is tender I am not certain whether this is better or not. The area on the right fifth toe is eschared as is the right second toe. Other than the protecting these areas I would not do any further debridement. Electronic Signature(s) Signed: 08/31/2019 6:13:52 PM By: Baltazar Najjar MD Entered By: Baltazar Najjar on 08/31/2019 11:46:36 -------------------------------------------------------------------------------- Physician Orders Details Patient Name: Date of Service: ABDUL, BEIRNE 08/31/2019 9:45 AM Medical Record [id number] Patient Account Number: [id number] Date of Birth/Sex: Treating RN: [birth year] (58 y.o. [name]) [name] Primary Care Provider: Orpah Cobb Other Clinician: Referring Provider: Treating Provider/Extender:Saryna Kneeland, Dorna Leitz, Orlie Pollen in Treatment: [phone] Verbal / Phone Orders: No Diagnosis Coding ICD-10 Coding Code Description [phone] Pressure ulcer of left ankle, stage 2 L89.892 Pressure ulcer of other site, stage 2 L89.893 Pressure ulcer of other site, stage 3 I73.89 Other specified peripheral vascular diseases Follow-up Appointments Return Appointment in 2 weeks. Dressing Change Frequency Wound #37 Right,Lateral Foot Change dressing  every day. Wound #38 Right Toe Fifth Change dressing every day. Wound Cleansing Wound #37 Right,Lateral Foot Clean wound with Wound Cleanser Wound #38 Right Toe Fifth Clean wound with Wound Cleanser Primary Wound Dressing Wound #37 Right,Lateral Foot Hydrofera Blue Other: - thin layer of gentamycin cream on wound bed then cover with hydrofera blue Wound #38 Right Toe Fifth Calcium Alginate with Silver Secondary Dressing Wound #37 Right,Lateral Foot Kerlix/Rolled Gauze Dry Gauze Wound #38 Right Toe Fifth Kerlix/Rolled Gauze Dry Gauze Edema Control Kerlix and Coban - Right Lower Extremity Off-Loading Multipodus Splint to: - sage boots or equivalent at all times Turn and reposition every 2 hours Home Health Continue Home Health skilled nursing for wound care. Beverly Gust Electronic Signature(s) Signed: 08/31/2019 6:13:52 PM By: Baltazar Najjar MD Signed: 08/31/2019 6:43:44 PM By: [name] RN Entered By: [name] on 08/31/2019 11:14:41 -------------------------------------------------------------------------------- Problem List Details Patient Name: Date of Service: GOBLE, FUDALA 08/31/2019 9:45 AM Medical Record [id number] Patient Account Number: [id number] Date of Birth/Sex: Treating RN: [birth year] (58 y.o. [name] Primary Care Provider: Orpah Cobb Other Clinician: Referring Provider: Treating Provider/Extender:Ura Yingling, Dorna Leitz, Orlie Pollen in Treatment: 111 Active Problems ICD-10 Evaluated Encounter Code Description Active Date Today Diagnosis L89.522 Pressure ulcer of left ankle, stage 2 04/28/2019 No Yes L89.892 Pressure ulcer of other site, stage 2 06/23/2019 No Yes L89.893 Pressure ulcer of other site, stage 3 06/23/2019 No Yes I73.89 Other specified peripheral vascular diseases 08/26/2018 No Yes Inactive Problems ICD-10 Code Description Active Date Inactive Date L97.511 Non-pressure chronic ulcer of other part of right foot  limited to 07/14/2017 07/14/2017 breakdown of skin L03.116 Cellulitis of left lower limb 08/26/2018 08/26/2018 L89.312 Pressure ulcer of right buttock, stage 2 12/25/2017 12/25/2017 L03.115 Cellulitis of right lower limb 12/25/2017 12/25/2017 L89.152 Pressure ulcer of sacral region, stage 2 12/25/2017 12/25/2017 L89.322 Pressure ulcer of left buttock,  stage 2 12/25/2017 12/25/2017 L03.116 Cellulitis of left lower limb 11/23/2018 11/23/2018 Resolved Problems ICD-10 Code Description Active Date Resolved Date L89.152 Pressure ulcer of sacral region, stage 2 03/31/2019 03/31/2019 L97.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 12/25/2017 12/25/2017 layer exposed L97.322 Non-pressure chronic ulcer of left 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 05/01/2018 05/01/2018 L97.521 Non-pressure chronic ulcer of other part of left foot limited to 11/10/2018 11/10/2018 breakdown of skin Electronic Signature(s) Signed: 08/31/2019 6:13:52 PM By: Baltazar Najjarobson, Raimi Guillermo MD Entered By: Baltazar Najjarobson, Ashauna Bertholf on 08/31/2019 11:41:11 -------------------------------------------------------------------------------- Progress Note Details Patient Name: Date of Service: [name] RhineHUMBLE, [name] 08/31/2019 9:45 AM Medical Record [id number] Patient Account Number: [id number] Date of Birth/Sex: Treating RN: [birth year] (58 y.o. Melonie FloridaM) Epps, Carrie Primary Care Provider: Orpah CobbMULLIS, KIERSTEN Other Clinician: Referring Provider: Treating Provider/Extender:Nehemyah Foushee, Dorna LeitzMichael MULLIS, Orlie PollenKIERSTEN Weeks in Treatment: 111 Subjective History of Present Illness (HPI) 11/23/15; this is a patient I remember from a previous stay in this clinic in 2012. The patient says this was for a wound in this same area as currently although I have no information to verify that. Most of the information is provided by his caregiver from the group home where he resides. Apparently he has had a wound in this area for 8 months. He also has erythema around this area and has had multiple rounds  of antibiotics apparently with some improvement but the erythema is persists. He apparently had a fracture in the ankle 2 months ago and was seen by Dr. Lajoyce Cornersuda . He apparently had a splint applied and more recently a heel offloading boot. He had home health coming out to a week ago not clear what they are dressing this with but they apparently discharged him perhaps because of one of Dr. 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 He is not a diabetic. Looking through [facility] he appears to have fairly active primary care. The area on the ankle is variably labeled as a pressure area and/or chronic venous insufficiency. He had a recent venous ultrasound on 5/25 that did not show a DVT in the left leg however this was not a reflux study. There was no superficial DVT either. Arterial studies on 10/15/14 showed a left ABI of 1.16 Center right of 1.12 waveforms were triphasic he does not have significant arterial disease. He has had recent x-rays of the left foot and ankle. The left foot did not show any abnormality. Left ankle x-ray showed a spiral fracture of the left tibial diaphysis without evidence of osteomyelitis. 11/30/15 culture I did last week showed pseudomonas I changed him to oral ciprofloxacin. Erythema is a lot better. 12/07/15 area around the wound shows considerable improvement of the periwound erythema. 12/14/15; the patient has a improvement of the periwound erythema which in retrospect I think with cellulitis successfully treated. We have been using Iodoflex started last week 12/25/15 wound on the left medial malleolus and dorsal left foot. Theraskin #1 applied READMISSION 02/27/16; the patient was admitted to hospital from 8/14 through 01/18/16 initially felt to have cellulitis of his left lower leg however he was noted to have significant persistent pain  and swelling. A duplex ultrasound was ordered that was negative. Obie DredgeLangley an x-ray was done that showed a spiral fracture of the left leg. With posterior displacement and angulation at the mid left tibia. Orthopedics saw the patient and splinted the leg. He  [name], [name] ([id number]) Visit Report for 08/31/2019 HPI Details Patient Name: Date of Service: [name], [name] 08/31/2019 9:45 AM Medical Record [id number] Patient Account Number: [id number] Date of Birth/Sex: Treating RN: 03-Dec-1960 (58 y.o. [name]) Carlene Coria Primary Care Provider: [name] Other Clinician: Referring Provider: Treating Provider/Extender:Dominiq Fontaine, Providence Lanius, Randon Goldsmith in Treatment: 111 History of Present Illness HPI Description: 11/23/15; this is a patient I remember from a previous stay in this clinic in 2012. The patient says this was for a wound in this same area as currently although I have no information to verify that. Most of the information is provided by his caregiver from the group home where he resides. Apparently he has had a wound in this area for 8 months. He also has erythema around this area and has had multiple rounds of antibiotics apparently with some improvement but the erythema is persists. He apparently had a fracture in the ankle 2 months ago and was seen by Dr. Sharol Given . He apparently had a splint applied and more recently a heel offloading boot. He had home health coming out to a week ago not clear what they are dressing this with but they apparently discharged him perhaps because of one of Dr. 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 He is not a diabetic. Looking through [facility] he appears to have fairly active primary care. The area on the ankle is variably labeled as a pressure area and/or chronic venous insufficiency. He had a recent venous ultrasound on 5/25 that did not show a DVT in the left leg however this was not a reflux study. There was no superficial DVT either. Arterial studies on 10/15/14 showed a left ABI of 1.16 Center right of 1.12 waveforms were triphasic he does not  have significant arterial disease. He has had recent x-rays of the left foot and ankle. The left foot did not show any abnormality. Left ankle x-ray showed a spiral fracture of the left tibial diaphysis without evidence of osteomyelitis. 11/30/15 culture I did last week showed pseudomonas I changed him to oral ciprofloxacin. Erythema is a lot better. 12/07/15 area around the wound shows considerable improvement of the periwound erythema. 12/14/15; the patient has a improvement of the periwound erythema which in retrospect I think with cellulitis successfully treated. We have been using Iodoflex started last week 12/25/15 wound on the left medial malleolus and dorsal left foot. Theraskin #1 applied READMISSION 02/27/16; the patient was admitted to hospital from 8/14 through 01/18/16 initially felt to have cellulitis of his left lower leg however he was noted to have significant persistent pain and swelling. A duplex ultrasound was ordered that was negative. Bland Span an x-ray was done that showed a spiral fracture of the left leg. With posterior displacement and angulation at the mid left tibia. Orthopedics saw the patient and splinted the leg. He was then sent to [facility] skilled facility and only return to his group home last week. He has wounds on the left medial malleolus, left dorsal foot and a worrisome area on the plantar left heel which is not really open but may represent a hematoma or a DTI 03/06/16; areas of left anterior and medial ankle both look improved. There are 3 small wounds here. He has a area over the dorsal left first metatarsal head which is not open. In meantime his left heel has opened  [name], [name] ([id number]) Visit Report for 08/31/2019 HPI Details Patient Name: Date of Service: [name], [name] 08/31/2019 9:45 AM Medical Record [id number] Patient Account Number: [id number] Date of Birth/Sex: Treating RN: 03-Dec-1960 (58 y.o. [name]) Carlene Coria Primary Care Provider: [name] Other Clinician: Referring Provider: Treating Provider/Extender:Dominiq Fontaine, Providence Lanius, Randon Goldsmith in Treatment: 111 History of Present Illness HPI Description: 11/23/15; this is a patient I remember from a previous stay in this clinic in 2012. The patient says this was for a wound in this same area as currently although I have no information to verify that. Most of the information is provided by his caregiver from the group home where he resides. Apparently he has had a wound in this area for 8 months. He also has erythema around this area and has had multiple rounds of antibiotics apparently with some improvement but the erythema is persists. He apparently had a fracture in the ankle 2 months ago and was seen by Dr. Sharol Given . He apparently had a splint applied and more recently a heel offloading boot. He had home health coming out to a week ago not clear what they are dressing this with but they apparently discharged him perhaps because of one of Dr. 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 He is not a diabetic. Looking through [facility] he appears to have fairly active primary care. The area on the ankle is variably labeled as a pressure area and/or chronic venous insufficiency. He had a recent venous ultrasound on 5/25 that did not show a DVT in the left leg however this was not a reflux study. There was no superficial DVT either. Arterial studies on 10/15/14 showed a left ABI of 1.16 Center right of 1.12 waveforms were triphasic he does not  have significant arterial disease. He has had recent x-rays of the left foot and ankle. The left foot did not show any abnormality. Left ankle x-ray showed a spiral fracture of the left tibial diaphysis without evidence of osteomyelitis. 11/30/15 culture I did last week showed pseudomonas I changed him to oral ciprofloxacin. Erythema is a lot better. 12/07/15 area around the wound shows considerable improvement of the periwound erythema. 12/14/15; the patient has a improvement of the periwound erythema which in retrospect I think with cellulitis successfully treated. We have been using Iodoflex started last week 12/25/15 wound on the left medial malleolus and dorsal left foot. Theraskin #1 applied READMISSION 02/27/16; the patient was admitted to hospital from 8/14 through 01/18/16 initially felt to have cellulitis of his left lower leg however he was noted to have significant persistent pain and swelling. A duplex ultrasound was ordered that was negative. [facility]an an x-ray was done that showed a spiral fracture of the left leg. With posterior displacement and angulation at the mid left tibia. Orthopedics saw the patient and splinted the leg. He was then sent to [facility] skilled facility and only return to his group home last week. He has wounds on the left medial malleolus, left dorsal foot and a worrisome area on the plantar left heel which is not really open but may represent a hematoma or a DTI 03/06/16; areas of left anterior and medial ankle both look improved. There are 3 small wounds here. He has a area over the dorsal left first metatarsal head which is not open. In meantime his left heel has opened  Record [id number] Patient Account Number: [id number] Date of Birth/Sex: Treating  RN: 1960/10/28 (58 y.o. [name]) [name] Primary Care Provider: Orpah Cobb Other Clinician: Referring Provider: Treating Provider/Extender:Kennan Detter, Dorna Leitz, Orlie Pollen in Treatment: 111 Constitutional Sitting or standing Blood Pressure is within target range for patient.. Pulse regular and within target range for patient.Marland Kitchen Respirations regular, non-labored and within target range.. Temperature is normal and within the target range for the patient.Marland Kitchen Appears in no distress. Cardiovascular Pedal pulses are palpable. Notes Wound exam; the patient's wound is on the right lateral foot. Fairly substantial. A lot of weeping easily friable tissue. There is still some erythema around the wound that is tender I am not certain whether this is better or not. The area on the right fifth toe is eschared as is the right second toe. Other than the protecting these areas I would not do any further debridement. Electronic Signature(s) Signed: 08/31/2019 6:13:52 PM By: Baltazar Najjar MD Entered By: Baltazar Najjar on 08/31/2019 11:46:36 -------------------------------------------------------------------------------- Physician Orders Details Patient Name: Date of Service: ABDUL, BEIRNE 08/31/2019 9:45 AM Medical Record [id number] Patient Account Number: [id number] Date of Birth/Sex: Treating RN: [birth year] (58 y.o. [name]) [name] Primary Care Provider: Orpah Cobb Other Clinician: Referring Provider: Treating Provider/Extender:Saryna Kneeland, Dorna Leitz, Orlie Pollen in Treatment: [id number] Verbal / Phone Orders: No Diagnosis Coding ICD-10 Coding Code Description [phone] Pressure ulcer of left ankle, stage 2 L89.892 Pressure ulcer of other site, stage 2 L89.893 Pressure ulcer of other site, stage 3 I73.89 Other specified peripheral vascular diseases Follow-up Appointments Return Appointment in 2 weeks. Dressing Change Frequency Wound #37 Right,Lateral Foot Change dressing  every day. Wound #38 Right Toe Fifth Change dressing every day. Wound Cleansing Wound #37 Right,Lateral Foot Clean wound with Wound Cleanser Wound #38 Right Toe Fifth Clean wound with Wound Cleanser Primary Wound Dressing Wound #37 Right,Lateral Foot Hydrofera Blue Other: - thin layer of gentamycin cream on wound bed then cover with hydrofera blue Wound #38 Right Toe Fifth Calcium Alginate with Silver Secondary Dressing Wound #37 Right,Lateral Foot Kerlix/Rolled Gauze Dry Gauze Wound #38 Right Toe Fifth Kerlix/Rolled Gauze Dry Gauze Edema Control Kerlix and Coban - Right Lower Extremity Off-Loading Multipodus Splint to: - sage boots or equivalent at all times Turn and reposition every 2 hours Home Health Continue Home Health skilled nursing for wound care. Beverly Gust Electronic Signature(s) Signed: 08/31/2019 6:13:52 PM By: Baltazar Najjar MD Signed: 08/31/2019 6:43:44 PM By: [name] RN Entered By: [name] on 08/31/2019 11:14:41 -------------------------------------------------------------------------------- Problem List Details Patient Name: Date of Service: GOBLE, FUDALA 08/31/2019 9:45 AM Medical Record [id number] Patient Account Number: [id number] Date of Birth/Sex: Treating RN: [birth year] (58 y.o. [name] Primary Care Provider: Orpah Cobb Other Clinician: Referring Provider: Treating Provider/Extender:Ura Yingling, Dorna Leitz, Orlie Pollen in Treatment: 111 Active Problems ICD-10 Evaluated Encounter Code Description Active Date Today Diagnosis L89.522 Pressure ulcer of left ankle, stage 2 04/28/2019 No Yes L89.892 Pressure ulcer of other site, stage 2 06/23/2019 No Yes L89.893 Pressure ulcer of other site, stage 3 06/23/2019 No Yes I73.89 Other specified peripheral vascular diseases 08/26/2018 No Yes Inactive Problems ICD-10 Code Description Active Date Inactive Date L97.511 Non-pressure chronic ulcer of other part of right foot  limited to 07/14/2017 07/14/2017 breakdown of skin L03.116 Cellulitis of left lower limb 08/26/2018 08/26/2018 L89.312 Pressure ulcer of right buttock, stage 2 12/25/2017 12/25/2017 L03.115 Cellulitis of right lower limb 12/25/2017 12/25/2017 L89.152 Pressure ulcer of sacral region, stage 2 12/25/2017 12/25/2017 L89.322 Pressure ulcer of left buttock,  stage 2 12/25/2017 12/25/2017 L03.116 Cellulitis of left lower limb 11/23/2018 11/23/2018 Resolved Problems ICD-10 Code Description Active Date Resolved Date L89.152 Pressure ulcer of sacral region, stage 2 03/31/2019 03/31/2019 L97.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 12/25/2017 12/25/2017 layer exposed L97.322 Non-pressure chronic ulcer of left 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 05/01/2018 05/01/2018 L97.521 Non-pressure chronic ulcer of other part of left foot limited to 11/10/2018 11/10/2018 breakdown of skin Electronic Signature(s) Signed: 08/31/2019 6:13:52 PM By: Baltazar Najjarobson, Raimi Guillermo MD Entered By: Baltazar Najjarobson, Ashauna Bertholf on 08/31/2019 11:41:11 -------------------------------------------------------------------------------- Progress Note Details Patient Name: Date of Service: [name] RhineHUMBLE, [name] 08/31/2019 9:45 AM Medical Record [id number] Patient Account Number: [id number] Date of Birth/Sex: Treating RN: [birth year] (58 y.o. Melonie [place]M) Epps, Carrie Primary Care Provider: Orpah CobbMULLIS, KIERSTEN Other Clinician: Referring Provider: Treating Provider/Extender:Nehemyah Foushee, Dorna LeitzMichael MULLIS, Orlie PollenKIERSTEN Weeks in Treatment: 111 Subjective History of Present Illness (HPI) 11/23/15; this is a patient I remember from a previous stay in this clinic in 2012. The patient says this was for a wound in this same area as currently although I have no information to verify that. Most of the information is provided by his caregiver from the group home where he resides. Apparently he has had a wound in this area for 8 months. He also has erythema around this area and has had multiple rounds  of antibiotics apparently with some improvement but the erythema is persists. He apparently had a fracture in the ankle 2 months ago and was seen by Dr. Lajoyce Cornersuda . He apparently had a splint applied and more recently a heel offloading boot. He had home health coming out to a week ago not clear what they are dressing this with but they apparently discharged him perhaps because of one of Dr. 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 He is not a diabetic. Looking through [facility] he appears to have fairly active primary care. The area on the ankle is variably labeled as a pressure area and/or chronic venous insufficiency. He had a recent venous ultrasound on 5/25 that did not show a DVT in the left leg however this was not a reflux study. There was no superficial DVT either. Arterial studies on 10/15/14 showed a left ABI of 1.16 Center right of 1.12 waveforms were triphasic he does not have significant arterial disease. He has had recent x-rays of the left foot and ankle. The left foot did not show any abnormality. Left ankle x-ray showed a spiral fracture of the left tibial diaphysis without evidence of osteomyelitis. 11/30/15 culture I did last week showed pseudomonas I changed him to oral ciprofloxacin. Erythema is a lot better. 12/07/15 area around the wound shows considerable improvement of the periwound erythema. 12/14/15; the patient has a improvement of the periwound erythema which in retrospect I think with cellulitis successfully treated. We have been using Iodoflex started last week 12/25/15 wound on the left medial malleolus and dorsal left foot. Theraskin #1 applied READMISSION 02/27/16; the patient was admitted to hospital from 8/14 through 01/18/16 initially felt to have cellulitis of his left lower leg however he was noted to have significant persistent pain  and swelling. A duplex ultrasound was ordered that was negative. Obie DredgeLangley an x-ray was done that showed a spiral fracture of the left leg. With posterior displacement and angulation at the mid left tibia. Orthopedics saw the patient and splinted the leg. 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 08/31/2019 9:45 AM Medical Record [id number] Patient Account Number: [id number] Date of Birth/Sex: Treating RN: [birth year] (58 y.o. Melonie [place]M) Epps, Carrie Primary Care Provider: Orpah CobbMULLIS, KIERSTEN Other Clinician: Referring Provider: Treating Provider/Extender:Nehemyah Foushee, Dorna LeitzMichael MULLIS, Orlie PollenKIERSTEN Weeks in Treatment: 111 Subjective History of Present Illness (HPI) 11/23/15; this is a patient I remember from a previous stay in this clinic in 2012. The patient says this was for a wound in this same area as currently although I have no information to verify that. Most of the information is provided by his caregiver from the group home where he resides. Apparently he has had a wound in this area for 8 months. He also has erythema around this area and has had multiple rounds  of antibiotics apparently with some improvement but the erythema is persists. He apparently had a fracture in the ankle 2 months ago and was seen by Dr. Lajoyce Cornersuda . He apparently had a splint applied and more recently a heel offloading boot. He had home health coming out to a week ago not clear what they are dressing this with but they apparently discharged him perhaps because of one of Dr. 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 He is not a diabetic. Looking through [facility] he appears to have fairly active primary care. The area on the ankle is variably labeled as a pressure area and/or chronic venous insufficiency. He had a recent venous ultrasound on 5/25 that did not show a DVT in the left leg however this was not a reflux study. There was no superficial DVT either. Arterial studies on 10/15/14 showed a left ABI of 1.16 Center right of 1.12 waveforms were triphasic he does not have significant arterial disease. He has had recent x-rays of the left foot and ankle. The left foot did not show any abnormality. Left ankle x-ray showed a spiral fracture of the left tibial diaphysis without evidence of osteomyelitis. 11/30/15 culture I did last week showed pseudomonas I changed him to oral ciprofloxacin. Erythema is a lot better. 12/07/15 area around the wound shows considerable improvement of the periwound erythema. 12/14/15; the patient has a improvement of the periwound erythema which in retrospect I think with cellulitis successfully treated. We have been using Iodoflex started last week 12/25/15 wound on the left medial malleolus and dorsal left foot. Theraskin #1 applied READMISSION 02/27/16; the patient was admitted to hospital from 8/14 through 01/18/16 initially felt to have cellulitis of his left lower leg however he was noted to have significant persistent pain  and swelling. A duplex ultrasound was ordered that was negative. Obie DredgeLangley an x-ray was done that showed a spiral fracture of the left leg. With posterior displacement and angulation at the mid left tibia. Orthopedics saw the patient and splinted the leg. He  Record [id number] Patient Account Number: [id number] Date of Birth/Sex: Treating  RN: 1960/10/28 (58 y.o. [name]) [name] Primary Care Provider: Orpah Cobb Other Clinician: Referring Provider: Treating Provider/Extender:Kennan Detter, Dorna Leitz, Orlie Pollen in Treatment: 111 Constitutional Sitting or standing Blood Pressure is within target range for patient.. Pulse regular and within target range for patient.Marland Kitchen Respirations regular, non-labored and within target range.. Temperature is normal and within the target range for the patient.Marland Kitchen Appears in no distress. Cardiovascular Pedal pulses are palpable. Notes Wound exam; the patient's wound is on the right lateral foot. Fairly substantial. A lot of weeping easily friable tissue. There is still some erythema around the wound that is tender I am not certain whether this is better or not. The area on the right fifth toe is eschared as is the right second toe. Other than the protecting these areas I would not do any further debridement. Electronic Signature(s) Signed: 08/31/2019 6:13:52 PM By: Baltazar Najjar MD Entered By: Baltazar Najjar on 08/31/2019 11:46:36 -------------------------------------------------------------------------------- Physician Orders Details Patient Name: Date of Service: ABDUL, BEIRNE 08/31/2019 9:45 AM Medical Record [id number] Patient Account Number: [id number] Date of Birth/Sex: Treating RN: [birth year] (58 y.o. [name]) [name] Primary Care Provider: Orpah Cobb Other Clinician: Referring Provider: Treating Provider/Extender:Saryna Kneeland, Dorna Leitz, Orlie Pollen in Treatment: [phone] Verbal / Phone Orders: No Diagnosis Coding ICD-10 Coding Code Description [phone] Pressure ulcer of left ankle, stage 2 L89.892 Pressure ulcer of other site, stage 2 L89.893 Pressure ulcer of other site, stage 3 I73.89 Other specified peripheral vascular diseases Follow-up Appointments Return Appointment in 2 weeks. Dressing Change Frequency Wound #37 Right,Lateral Foot Change dressing  every day. Wound #38 Right Toe Fifth Change dressing every day. Wound Cleansing Wound #37 Right,Lateral Foot Clean wound with Wound Cleanser Wound #38 Right Toe Fifth Clean wound with Wound Cleanser Primary Wound Dressing Wound #37 Right,Lateral Foot Hydrofera Blue Other: - thin layer of gentamycin cream on wound bed then cover with hydrofera blue Wound #38 Right Toe Fifth Calcium Alginate with Silver Secondary Dressing Wound #37 Right,Lateral Foot Kerlix/Rolled Gauze Dry Gauze Wound #38 Right Toe Fifth Kerlix/Rolled Gauze Dry Gauze Edema Control Kerlix and Coban - Right Lower Extremity Off-Loading Multipodus Splint to: - sage boots or equivalent at all times Turn and reposition every 2 hours Home Health Continue Home Health skilled nursing for wound care. Beverly Gust Electronic Signature(s) Signed: 08/31/2019 6:13:52 PM By: Baltazar Najjar MD Signed: 08/31/2019 6:43:44 PM By: [name] RN Entered By: [name] on 08/31/2019 11:14:41 -------------------------------------------------------------------------------- Problem List Details Patient Name: Date of Service: GOBLE, FUDALA 08/31/2019 9:45 AM Medical Record [id number] Patient Account Number: [id number] Date of Birth/Sex: Treating RN: [birth year] (58 y.o. [name] Primary Care Provider: Orpah Cobb Other Clinician: Referring Provider: Treating Provider/Extender:Ura Yingling, Dorna Leitz, Orlie Pollen in Treatment: 111 Active Problems ICD-10 Evaluated Encounter Code Description Active Date Today Diagnosis L89.522 Pressure ulcer of left ankle, stage 2 04/28/2019 No Yes L89.892 Pressure ulcer of other site, stage 2 06/23/2019 No Yes L89.893 Pressure ulcer of other site, stage 3 06/23/2019 No Yes I73.89 Other specified peripheral vascular diseases 08/26/2018 No Yes Inactive Problems ICD-10 Code Description Active Date Inactive Date L97.511 Non-pressure chronic ulcer of other part of right foot  limited to 07/14/2017 07/14/2017 breakdown of skin L03.116 Cellulitis of left lower limb 08/26/2018 08/26/2018 L89.312 Pressure ulcer of right buttock, stage 2 12/25/2017 12/25/2017 L03.115 Cellulitis of right lower limb 12/25/2017 12/25/2017 L89.152 Pressure ulcer of sacral region, stage 2 12/25/2017 12/25/2017 L89.322 Pressure ulcer of left buttock,  [name], [name] ([id number]) Visit Report for 08/31/2019 HPI Details Patient Name: Date of Service: [name], [name] 08/31/2019 9:45 AM Medical Record [id number] Patient Account Number: [id number] Date of Birth/Sex: Treating RN: 03-Dec-1960 (58 y.o. [name]) Carlene Coria Primary Care Provider: [name] Other Clinician: Referring Provider: Treating Provider/Extender:Dominiq Fontaine, Providence Lanius, Randon Goldsmith in Treatment: 111 History of Present Illness HPI Description: 11/23/15; this is a patient I remember from a previous stay in this clinic in 2012. The patient says this was for a wound in this same area as currently although I have no information to verify that. Most of the information is provided by his caregiver from the group home where he resides. Apparently he has had a wound in this area for 8 months. He also has erythema around this area and has had multiple rounds of antibiotics apparently with some improvement but the erythema is persists. He apparently had a fracture in the ankle 2 months ago and was seen by Dr. Sharol Given . He apparently had a splint applied and more recently a heel offloading boot. He had home health coming out to a week ago not clear what they are dressing this with but they apparently discharged him perhaps because of one of Dr. 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 He is not a diabetic. Looking through [facility] he appears to have fairly active primary care. The area on the ankle is variably labeled as a pressure area and/or chronic venous insufficiency. He had a recent venous ultrasound on 5/25 that did not show a DVT in the left leg however this was not a reflux study. There was no superficial DVT either. Arterial studies on 10/15/14 showed a left ABI of 1.16 Center right of 1.12 waveforms were triphasic he does not  have significant arterial disease. He has had recent x-rays of the left foot and ankle. The left foot did not show any abnormality. Left ankle x-ray showed a spiral fracture of the left tibial diaphysis without evidence of osteomyelitis. 11/30/15 culture I did last week showed pseudomonas I changed him to oral ciprofloxacin. Erythema is a lot better. 12/07/15 area around the wound shows considerable improvement of the periwound erythema. 12/14/15; the patient has a improvement of the periwound erythema which in retrospect I think with cellulitis successfully treated. We have been using Iodoflex started last week 12/25/15 wound on the left medial malleolus and dorsal left foot. Theraskin #1 applied READMISSION 02/27/16; the patient was admitted to hospital from 8/14 through 01/18/16 initially felt to have cellulitis of his left lower leg however he was noted to have significant persistent pain and swelling. A duplex ultrasound was ordered that was negative. Bland Span an x-ray was done that showed a spiral fracture of the left leg. With posterior displacement and angulation at the mid left tibia. Orthopedics saw the patient and splinted the leg. He was then sent to [facility] skilled facility and only return to his group home last week. He has wounds on the left medial malleolus, left dorsal foot and a worrisome area on the plantar left heel which is not really open but may represent a hematoma or a DTI 03/06/16; areas of left anterior and medial ankle both look improved. There are 3 small wounds here. He has a area over the dorsal left first metatarsal head which is not open. In meantime his left heel has opened  Record [id number] Patient Account Number: [id number] Date of Birth/Sex: Treating  RN: 1960/10/28 (58 y.o. [name]) [name] Primary Care Provider: Orpah Cobb Other Clinician: Referring Provider: Treating Provider/Extender:Kennan Detter, Dorna Leitz, Orlie Pollen in Treatment: 111 Constitutional Sitting or standing Blood Pressure is within target range for patient.. Pulse regular and within target range for patient.Marland Kitchen Respirations regular, non-labored and within target range.. Temperature is normal and within the target range for the patient.Marland Kitchen Appears in no distress. Cardiovascular Pedal pulses are palpable. Notes Wound exam; the patient's wound is on the right lateral foot. Fairly substantial. A lot of weeping easily friable tissue. There is still some erythema around the wound that is tender I am not certain whether this is better or not. The area on the right fifth toe is eschared as is the right second toe. Other than the protecting these areas I would not do any further debridement. Electronic Signature(s) Signed: 08/31/2019 6:13:52 PM By: Baltazar Najjar MD Entered By: Baltazar Najjar on 08/31/2019 11:46:36 -------------------------------------------------------------------------------- Physician Orders Details Patient Name: Date of Service: ABDUL, BEIRNE 08/31/2019 9:45 AM Medical Record [id number] Patient Account Number: [id number] Date of Birth/Sex: Treating RN: [birth year] (58 y.o. [name]) [name] Primary Care Provider: Orpah Cobb Other Clinician: Referring Provider: Treating Provider/Extender:Saryna Kneeland, Dorna Leitz, Orlie Pollen in Treatment: [phone] Verbal / Phone Orders: No Diagnosis Coding ICD-10 Coding Code Description [phone] Pressure ulcer of left ankle, stage 2 L89.892 Pressure ulcer of other site, stage 2 L89.893 Pressure ulcer of other site, stage 3 I73.89 Other specified peripheral vascular diseases Follow-up Appointments Return Appointment in 2 weeks. Dressing Change Frequency Wound #37 Right,Lateral Foot Change dressing  every day. Wound #38 Right Toe Fifth Change dressing every day. Wound Cleansing Wound #37 Right,Lateral Foot Clean wound with Wound Cleanser Wound #38 Right Toe Fifth Clean wound with Wound Cleanser Primary Wound Dressing Wound #37 Right,Lateral Foot Hydrofera Blue Other: - thin layer of gentamycin cream on wound bed then cover with hydrofera blue Wound #38 Right Toe Fifth Calcium Alginate with Silver Secondary Dressing Wound #37 Right,Lateral Foot Kerlix/Rolled Gauze Dry Gauze Wound #38 Right Toe Fifth Kerlix/Rolled Gauze Dry Gauze Edema Control Kerlix and Coban - Right Lower Extremity Off-Loading Multipodus Splint to: - sage boots or equivalent at all times Turn and reposition every 2 hours Home Health Continue Home Health skilled nursing for wound care. Beverly Gust Electronic Signature(s) Signed: 08/31/2019 6:13:52 PM By: Baltazar Najjar MD Signed: 08/31/2019 6:43:44 PM By: [name] RN Entered By: [name] on 08/31/2019 11:14:41 -------------------------------------------------------------------------------- Problem List Details Patient Name: Date of Service: GOBLE, FUDALA 08/31/2019 9:45 AM Medical Record [id number] Patient Account Number: [id number] Date of Birth/Sex: Treating RN: [birth year] (58 y.o. [name] Primary Care Provider: Orpah Cobb Other Clinician: Referring Provider: Treating Provider/Extender:Ura Yingling, Dorna Leitz, Orlie Pollen in Treatment: 111 Active Problems ICD-10 Evaluated Encounter Code Description Active Date Today Diagnosis L89.522 Pressure ulcer of left ankle, stage 2 04/28/2019 No Yes L89.892 Pressure ulcer of other site, stage 2 06/23/2019 No Yes L89.893 Pressure ulcer of other site, stage 3 06/23/2019 No Yes I73.89 Other specified peripheral vascular diseases 08/26/2018 No Yes Inactive Problems ICD-10 Code Description Active Date Inactive Date L97.511 Non-pressure chronic ulcer of other part of right foot  limited to 07/14/2017 07/14/2017 breakdown of skin L03.116 Cellulitis of left lower limb 08/26/2018 08/26/2018 L89.312 Pressure ulcer of right buttock, stage 2 12/25/2017 12/25/2017 L03.115 Cellulitis of right lower limb 12/25/2017 12/25/2017 L89.152 Pressure ulcer of sacral region, stage 2 12/25/2017 12/25/2017 L89.322 Pressure ulcer of left buttock,  Record [id number] Patient Account Number: [id number] Date of Birth/Sex: Treating  RN: 1960/10/28 (58 y.o. [name]) [name] Primary Care Provider: Orpah Cobb Other Clinician: Referring Provider: Treating Provider/Extender:Kennan Detter, Dorna Leitz, Orlie Pollen in Treatment: 111 Constitutional Sitting or standing Blood Pressure is within target range for patient.. Pulse regular and within target range for patient.Marland Kitchen Respirations regular, non-labored and within target range.. Temperature is normal and within the target range for the patient.Marland Kitchen Appears in no distress. Cardiovascular Pedal pulses are palpable. Notes Wound exam; the patient's wound is on the right lateral foot. Fairly substantial. A lot of weeping easily friable tissue. There is still some erythema around the wound that is tender I am not certain whether this is better or not. The area on the right fifth toe is eschared as is the right second toe. Other than the protecting these areas I would not do any further debridement. Electronic Signature(s) Signed: 08/31/2019 6:13:52 PM By: Baltazar Najjar MD Entered By: Baltazar Najjar on 08/31/2019 11:46:36 -------------------------------------------------------------------------------- Physician Orders Details Patient Name: Date of Service: ABDUL, BEIRNE 08/31/2019 9:45 AM Medical Record [id number] Patient Account Number: [id number] Date of Birth/Sex: Treating RN: [birth year] (58 y.o. [name]) [name] Primary Care Provider: Orpah Cobb Other Clinician: Referring Provider: Treating Provider/Extender:Saryna Kneeland, Dorna Leitz, Orlie Pollen in Treatment: [phone] Verbal / Phone Orders: No Diagnosis Coding ICD-10 Coding Code Description [phone] Pressure ulcer of left ankle, stage 2 L89.892 Pressure ulcer of other site, stage 2 L89.893 Pressure ulcer of other site, stage 3 I73.89 Other specified peripheral vascular diseases Follow-up Appointments Return Appointment in 2 weeks. Dressing Change Frequency Wound #37 Right,Lateral Foot Change dressing  every day. Wound #38 Right Toe Fifth Change dressing every day. Wound Cleansing Wound #37 Right,Lateral Foot Clean wound with Wound Cleanser Wound #38 Right Toe Fifth Clean wound with Wound Cleanser Primary Wound Dressing Wound #37 Right,Lateral Foot Hydrofera Blue Other: - thin layer of gentamycin cream on wound bed then cover with hydrofera blue Wound #38 Right Toe Fifth Calcium Alginate with Silver Secondary Dressing Wound #37 Right,Lateral Foot Kerlix/Rolled Gauze Dry Gauze Wound #38 Right Toe Fifth Kerlix/Rolled Gauze Dry Gauze Edema Control Kerlix and Coban - Right Lower Extremity Off-Loading Multipodus Splint to: - sage boots or equivalent at all times Turn and reposition every 2 hours Home Health Continue Home Health skilled nursing for wound care. Beverly Gust Electronic Signature(s) Signed: 08/31/2019 6:13:52 PM By: Baltazar Najjar MD Signed: 08/31/2019 6:43:44 PM By: [name] RN Entered By: [name] on 08/31/2019 11:14:41 -------------------------------------------------------------------------------- Problem List Details Patient Name: Date of Service: GOBLE, FUDALA 08/31/2019 9:45 AM Medical Record [id number] Patient Account Number: [id number] Date of Birth/Sex: Treating RN: [birth year] (58 y.o. [name] Primary Care Provider: Orpah Cobb Other Clinician: Referring Provider: Treating Provider/Extender:Ura Yingling, Dorna Leitz, Orlie Pollen in Treatment: 111 Active Problems ICD-10 Evaluated Encounter Code Description Active Date Today Diagnosis L89.522 Pressure ulcer of left ankle, stage 2 04/28/2019 No Yes L89.892 Pressure ulcer of other site, stage 2 06/23/2019 No Yes L89.893 Pressure ulcer of other site, stage 3 06/23/2019 No Yes I73.89 Other specified peripheral vascular diseases 08/26/2018 No Yes Inactive Problems ICD-10 Code Description Active Date Inactive Date L97.511 Non-pressure chronic ulcer of other part of right foot  limited to 07/14/2017 07/14/2017 breakdown of skin L03.116 Cellulitis of left lower limb 08/26/2018 08/26/2018 L89.312 Pressure ulcer of right buttock, stage 2 12/25/2017 12/25/2017 L03.115 Cellulitis of right lower limb 12/25/2017 12/25/2017 L89.152 Pressure ulcer of sacral region, stage 2 12/25/2017 12/25/2017 L89.322 Pressure ulcer of left buttock,  Record [id number] Patient Account Number: [id number] Date of Birth/Sex: Treating  RN: 1960/10/28 (58 y.o. [name]) [name] Primary Care Provider: Orpah Cobb Other Clinician: Referring Provider: Treating Provider/Extender:Kennan Detter, Dorna Leitz, Orlie Pollen in Treatment: 111 Constitutional Sitting or standing Blood Pressure is within target range for patient.. Pulse regular and within target range for patient.Marland Kitchen Respirations regular, non-labored and within target range.. Temperature is normal and within the target range for the patient.Marland Kitchen Appears in no distress. Cardiovascular Pedal pulses are palpable. Notes Wound exam; the patient's wound is on the right lateral foot. Fairly substantial. A lot of weeping easily friable tissue. There is still some erythema around the wound that is tender I am not certain whether this is better or not. The area on the right fifth toe is eschared as is the right second toe. Other than the protecting these areas I would not do any further debridement. Electronic Signature(s) Signed: 08/31/2019 6:13:52 PM By: Baltazar Najjar MD Entered By: Baltazar Najjar on 08/31/2019 11:46:36 -------------------------------------------------------------------------------- Physician Orders Details Patient Name: Date of Service: ABDUL, BEIRNE 08/31/2019 9:45 AM Medical Record [id number] Patient Account Number: [id number] Date of Birth/Sex: Treating RN: [birth year] (58 y.o. [name]) [name] Primary Care Provider: Orpah Cobb Other Clinician: Referring Provider: Treating Provider/Extender:Saryna Kneeland, Dorna Leitz, Orlie Pollen in Treatment: [phone] Verbal / Phone Orders: No Diagnosis Coding ICD-10 Coding Code Description [phone] Pressure ulcer of left ankle, stage 2 L89.892 Pressure ulcer of other site, stage 2 L89.893 Pressure ulcer of other site, stage 3 I73.89 Other specified peripheral vascular diseases Follow-up Appointments Return Appointment in 2 weeks. Dressing Change Frequency Wound #37 Right,Lateral Foot Change dressing  every day. Wound #38 Right Toe Fifth Change dressing every day. Wound Cleansing Wound #37 Right,Lateral Foot Clean wound with Wound Cleanser Wound #38 Right Toe Fifth Clean wound with Wound Cleanser Primary Wound Dressing Wound #37 Right,Lateral Foot Hydrofera Blue Other: - thin layer of gentamycin cream on wound bed then cover with hydrofera blue Wound #38 Right Toe Fifth Calcium Alginate with Silver Secondary Dressing Wound #37 Right,Lateral Foot Kerlix/Rolled Gauze Dry Gauze Wound #38 Right Toe Fifth Kerlix/Rolled Gauze Dry Gauze Edema Control Kerlix and Coban - Right Lower Extremity Off-Loading Multipodus Splint to: - sage boots or equivalent at all times Turn and reposition every 2 hours Home Health Continue Home Health skilled nursing for wound care. Beverly Gust Electronic Signature(s) Signed: 08/31/2019 6:13:52 PM By: Baltazar Najjar MD Signed: 08/31/2019 6:43:44 PM By: [name] RN Entered By: [name] on 08/31/2019 11:14:41 -------------------------------------------------------------------------------- Problem List Details Patient Name: Date of Service: GOBLE, FUDALA 08/31/2019 9:45 AM Medical Record [id number] Patient Account Number: [id number] Date of Birth/Sex: Treating RN: [birth year] (58 y.o. [name] Primary Care Provider: Orpah Cobb Other Clinician: Referring Provider: Treating Provider/Extender:Ura Yingling, Dorna Leitz, Orlie Pollen in Treatment: 111 Active Problems ICD-10 Evaluated Encounter Code Description Active Date Today Diagnosis L89.522 Pressure ulcer of left ankle, stage 2 04/28/2019 No Yes L89.892 Pressure ulcer of other site, stage 2 06/23/2019 No Yes L89.893 Pressure ulcer of other site, stage 3 06/23/2019 No Yes I73.89 Other specified peripheral vascular diseases 08/26/2018 No Yes Inactive Problems ICD-10 Code Description Active Date Inactive Date L97.511 Non-pressure chronic ulcer of other part of right foot  limited to 07/14/2017 07/14/2017 breakdown of skin L03.116 Cellulitis of left lower limb 08/26/2018 08/26/2018 L89.312 Pressure ulcer of right buttock, stage 2 12/25/2017 12/25/2017 L03.115 Cellulitis of right lower limb 12/25/2017 12/25/2017 L89.152 Pressure ulcer of sacral region, stage 2 12/25/2017 12/25/2017 L89.322 Pressure ulcer of left buttock,  [name], [name] ([id number]) Visit Report for 08/31/2019 HPI Details Patient Name: Date of Service: [name], [name] 08/31/2019 9:45 AM Medical Record [id number] Patient Account Number: [id number] Date of Birth/Sex: Treating RN: 03-Dec-1960 (58 y.o. [name]) Carlene Coria Primary Care Provider: [name] Other Clinician: Referring Provider: Treating Provider/Extender:Dominiq Fontaine, Providence Lanius, Randon Goldsmith in Treatment: 111 History of Present Illness HPI Description: 11/23/15; this is a patient I remember from a previous stay in this clinic in 2012. The patient says this was for a wound in this same area as currently although I have no information to verify that. Most of the information is provided by his caregiver from the group home where he resides. Apparently he has had a wound in this area for 8 months. He also has erythema around this area and has had multiple rounds of antibiotics apparently with some improvement but the erythema is persists. He apparently had a fracture in the ankle 2 months ago and was seen by Dr. Sharol Given . He apparently had a splint applied and more recently a heel offloading boot. He had home health coming out to a week ago not clear what they are dressing this with but they apparently discharged him perhaps because of one of Dr. 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 He is not a diabetic. Looking through [facility] he appears to have fairly active primary care. The area on the ankle is variably labeled as a pressure area and/or chronic venous insufficiency. He had a recent venous ultrasound on 5/25 that did not show a DVT in the left leg however this was not a reflux study. There was no superficial DVT either. Arterial studies on 10/15/14 showed a left ABI of 1.16 Center right of 1.12 waveforms were triphasic he does not  have significant arterial disease. He has had recent x-rays of the left foot and ankle. The left foot did not show any abnormality. Left ankle x-ray showed a spiral fracture of the left tibial diaphysis without evidence of osteomyelitis. 11/30/15 culture I did last week showed pseudomonas I changed him to oral ciprofloxacin. Erythema is a lot better. 12/07/15 area around the wound shows considerable improvement of the periwound erythema. 12/14/15; the patient has a improvement of the periwound erythema which in retrospect I think with cellulitis successfully treated. We have been using Iodoflex started last week 12/25/15 wound on the left medial malleolus and dorsal left foot. Theraskin #1 applied READMISSION 02/27/16; the patient was admitted to hospital from 8/14 through 01/18/16 initially felt to have cellulitis of his left lower leg however he was noted to have significant persistent pain and swelling. A duplex ultrasound was ordered that was negative. Bland Span an x-ray was done that showed a spiral fracture of the left leg. With posterior displacement and angulation at the mid left tibia. Orthopedics saw the patient and splinted the leg. He was then sent to [facility] skilled facility and only return to his group home last week. He has wounds on the left medial malleolus, left dorsal foot and a worrisome area on the plantar left heel which is not really open but may represent a hematoma or a DTI 03/06/16; areas of left anterior and medial ankle both look improved. There are 3 small wounds here. He has a area over the dorsal left first metatarsal head which is not open. In meantime his left heel has opened  Record [id number] Patient Account Number: [id number] Date of Birth/Sex: Treating  RN: 1960/10/28 (58 y.o. [name]) [name] Primary Care Provider: Orpah Cobb Other Clinician: Referring Provider: Treating Provider/Extender:Kennan Detter, Dorna Leitz, Orlie Pollen in Treatment: 111 Constitutional Sitting or standing Blood Pressure is within target range for patient.. Pulse regular and within target range for patient.Marland Kitchen Respirations regular, non-labored and within target range.. Temperature is normal and within the target range for the patient.Marland Kitchen Appears in no distress. Cardiovascular Pedal pulses are palpable. Notes Wound exam; the patient's wound is on the right lateral foot. Fairly substantial. A lot of weeping easily friable tissue. There is still some erythema around the wound that is tender I am not certain whether this is better or not. The area on the right fifth toe is eschared as is the right second toe. Other than the protecting these areas I would not do any further debridement. Electronic Signature(s) Signed: 08/31/2019 6:13:52 PM By: Baltazar Najjar MD Entered By: Baltazar Najjar on 08/31/2019 11:46:36 -------------------------------------------------------------------------------- Physician Orders Details Patient Name: Date of Service: ABDUL, BEIRNE 08/31/2019 9:45 AM Medical Record [id number] Patient Account Number: [id number] Date of Birth/Sex: Treating RN: [birth year] (58 y.o. [name]) [name] Primary Care Provider: Orpah Cobb Other Clinician: Referring Provider: Treating Provider/Extender:Saryna Kneeland, Dorna Leitz, Orlie Pollen in Treatment: [phone] Verbal / Phone Orders: No Diagnosis Coding ICD-10 Coding Code Description [phone] Pressure ulcer of left ankle, stage 2 L89.892 Pressure ulcer of other site, stage 2 L89.893 Pressure ulcer of other site, stage 3 I73.89 Other specified peripheral vascular diseases Follow-up Appointments Return Appointment in 2 weeks. Dressing Change Frequency Wound #37 Right,Lateral Foot Change dressing  every day. Wound #38 Right Toe Fifth Change dressing every day. Wound Cleansing Wound #37 Right,Lateral Foot Clean wound with Wound Cleanser Wound #38 Right Toe Fifth Clean wound with Wound Cleanser Primary Wound Dressing Wound #37 Right,Lateral Foot Hydrofera Blue Other: - thin layer of gentamycin cream on wound bed then cover with hydrofera blue Wound #38 Right Toe Fifth Calcium Alginate with Silver Secondary Dressing Wound #37 Right,Lateral Foot Kerlix/Rolled Gauze Dry Gauze Wound #38 Right Toe Fifth Kerlix/Rolled Gauze Dry Gauze Edema Control Kerlix and Coban - Right Lower Extremity Off-Loading Multipodus Splint to: - sage boots or equivalent at all times Turn and reposition every 2 hours Home Health Continue Home Health skilled nursing for wound care. Beverly Gust Electronic Signature(s) Signed: 08/31/2019 6:13:52 PM By: Baltazar Najjar MD Signed: 08/31/2019 6:43:44 PM By: [name] RN Entered By: [name] on 08/31/2019 11:14:41 -------------------------------------------------------------------------------- Problem List Details Patient Name: Date of Service: GOBLE, FUDALA 08/31/2019 9:45 AM Medical Record [id number] Patient Account Number: [id number] Date of Birth/Sex: Treating RN: [birth year] (58 y.o. [name] Primary Care Provider: Orpah Cobb Other Clinician: Referring Provider: Treating Provider/Extender:Ura Yingling, Dorna Leitz, Orlie Pollen in Treatment: 111 Active Problems ICD-10 Evaluated Encounter Code Description Active Date Today Diagnosis L89.522 Pressure ulcer of left ankle, stage 2 04/28/2019 No Yes L89.892 Pressure ulcer of other site, stage 2 06/23/2019 No Yes L89.893 Pressure ulcer of other site, stage 3 06/23/2019 No Yes I73.89 Other specified peripheral vascular diseases 08/26/2018 No Yes Inactive Problems ICD-10 Code Description Active Date Inactive Date L97.511 Non-pressure chronic ulcer of other part of right foot  limited to 07/14/2017 07/14/2017 breakdown of skin L03.116 Cellulitis of left lower limb 08/26/2018 08/26/2018 L89.312 Pressure ulcer of right buttock, stage 2 12/25/2017 12/25/2017 L03.115 Cellulitis of right lower limb 12/25/2017 12/25/2017 L89.152 Pressure ulcer of sacral region, stage 2 12/25/2017 12/25/2017 L89.322 Pressure ulcer of left buttock,  stage 2 12/25/2017 12/25/2017 L03.116 Cellulitis of left lower limb 11/23/2018 11/23/2018 Resolved Problems ICD-10 Code Description Active Date Resolved Date L89.152 Pressure ulcer of sacral region, stage 2 03/31/2019 03/31/2019 L97.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 12/25/2017 12/25/2017 layer exposed L97.322 Non-pressure chronic ulcer of left 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 05/01/2018 05/01/2018 L97.521 Non-pressure chronic ulcer of other part of left foot limited to 11/10/2018 11/10/2018 breakdown of skin Electronic Signature(s) Signed: 08/31/2019 6:13:52 PM By: Baltazar Najjarobson, Raimi Guillermo MD Entered By: Baltazar Najjarobson, Ashauna Bertholf on 08/31/2019 11:41:11 -------------------------------------------------------------------------------- Progress Note Details Patient Name: Date of Service: [name] RhineHUMBLE, [name] 08/31/2019 9:45 AM Medical Record [id number] Patient Account Number: [id number] Date of Birth/Sex: Treating RN: [birth year] (58 y.o. Melonie FloridaM) Epps, Carrie Primary Care Provider: Orpah CobbMULLIS, KIERSTEN Other Clinician: Referring Provider: Treating Provider/Extender:Nehemyah Foushee, Dorna LeitzMichael MULLIS, Orlie PollenKIERSTEN Weeks in Treatment: 111 Subjective History of Present Illness (HPI) 11/23/15; this is a patient I remember from a previous stay in this clinic in 2012. The patient says this was for a wound in this same area as currently although I have no information to verify that. Most of the information is provided by his caregiver from the group home where he resides. Apparently he has had a wound in this area for 8 months. He also has erythema around this area and has had multiple rounds  of antibiotics apparently with some improvement but the erythema is persists. He apparently had a fracture in the ankle 2 months ago and was seen by Dr. Lajoyce Cornersuda . He apparently had a splint applied and more recently a heel offloading boot. He had home health coming out to a week ago not clear what they are dressing this with but they apparently discharged him perhaps because of one of Dr. 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 He is not a diabetic. Looking through [facility] he appears to have fairly active primary care. The area on the ankle is variably labeled as a pressure area and/or chronic venous insufficiency. He had a recent venous ultrasound on 5/25 that did not show a DVT in the left leg however this was not a reflux study. There was no superficial DVT either. Arterial studies on 10/15/14 showed a left ABI of 1.16 Center right of 1.12 waveforms were triphasic he does not have significant arterial disease. He has had recent x-rays of the left foot and ankle. The left foot did not show any abnormality. Left ankle x-ray showed a spiral fracture of the left tibial diaphysis without evidence of osteomyelitis. 11/30/15 culture I did last week showed pseudomonas I changed him to oral ciprofloxacin. Erythema is a lot better. 12/07/15 area around the wound shows considerable improvement of the periwound erythema. 12/14/15; the patient has a improvement of the periwound erythema which in retrospect I think with cellulitis successfully treated. We have been using Iodoflex started last week 12/25/15 wound on the left medial malleolus and dorsal left foot. Theraskin #1 applied READMISSION 02/27/16; the patient was admitted to hospital from 8/14 through 01/18/16 initially felt to have cellulitis of his left lower leg however he was noted to have significant persistent pain  and swelling. A duplex ultrasound was ordered that was negative. Obie DredgeLangley an x-ray was done that showed a spiral fracture of the left leg. With posterior displacement and angulation at the mid left tibia. Orthopedics saw the patient and splinted the leg. He  [name], [name] ([id number]) Visit Report for 08/31/2019 HPI Details Patient Name: Date of Service: [name], [name] 08/31/2019 9:45 AM Medical Record [id number] Patient Account Number: [id number] Date of Birth/Sex: Treating RN: 03-Dec-1960 (58 y.o. [name]) Carlene Coria Primary Care Provider: [name] Other Clinician: Referring Provider: Treating Provider/Extender:Dominiq Fontaine, Providence Lanius, Randon Goldsmith in Treatment: 111 History of Present Illness HPI Description: 11/23/15; this is a patient I remember from a previous stay in this clinic in 2012. The patient says this was for a wound in this same area as currently although I have no information to verify that. Most of the information is provided by his caregiver from the group home where he resides. Apparently he has had a wound in this area for 8 months. He also has erythema around this area and has had multiple rounds of antibiotics apparently with some improvement but the erythema is persists. He apparently had a fracture in the ankle 2 months ago and was seen by Dr. Sharol Given . He apparently had a splint applied and more recently a heel offloading boot. He had home health coming out to a week ago not clear what they are dressing this with but they apparently discharged him perhaps because of one of Dr. 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 He is not a diabetic. Looking through [facility] link he appears to have fairly active primary care. The area on the ankle is variably labeled as a pressure area and/or chronic venous insufficiency. He had a recent venous ultrasound on 5/25 that did not show a DVT in the left leg however this was not a reflux study. There was no superficial DVT either. Arterial studies on 10/15/14 showed a left ABI of 1.16 Center right of 1.12 waveforms were triphasic he does not  have significant arterial disease. He has had recent x-rays of the left foot and ankle. The left foot did not show any abnormality. Left ankle x-ray showed a spiral fracture of the left tibial diaphysis without evidence of osteomyelitis. 11/30/15 culture I did last week showed pseudomonas I changed him to oral ciprofloxacin. Erythema is a lot better. 12/07/15 area around the wound shows considerable improvement of the periwound erythema. 12/14/15; the patient has a improvement of the periwound erythema which in retrospect I think with cellulitis successfully treated. We have been using Iodoflex started last week 12/25/15 wound on the left medial malleolus and dorsal left foot. Theraskin #1 applied READMISSION 02/27/16; the patient was admitted to hospital from 8/14 through 01/18/16 initially felt to have cellulitis of his left lower leg however he was noted to have significant persistent pain and swelling. A duplex ultrasound was ordered that was negative. Bland Span an x-ray was done that showed a spiral fracture of the left leg. With posterior displacement and angulation at the mid left tibia. Orthopedics saw the patient and splinted the leg. He was then sent to [facility] skilled facility and only return to his group home last week. He has wounds on the left medial malleolus, left dorsal foot and a worrisome area on the plantar left heel which is not really open but may represent a hematoma or a DTI 03/06/16; areas of left anterior and medial ankle both look improved. There are 3 small wounds here. He has a area over the dorsal left first metatarsal head which is not open. In meantime his left heel has opened

## 2019-09-02 ENCOUNTER — Ambulatory Visit (INDEPENDENT_AMBULATORY_CARE_PROVIDER_SITE_OTHER): Payer: Medicare Other | Admitting: Podiatry

## 2019-09-02 ENCOUNTER — Other Ambulatory Visit: Payer: Self-pay

## 2019-09-02 VITALS — Temp 98.3°F

## 2019-09-02 DIAGNOSIS — L97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 layer exposed: Secondary | ICD-10-CM | POA: Diagnosis not present

## 2019-09-02 NOTE — Progress Notes (Signed)
  Subjective:  Patient ID: [name], male    DOB: [birth year],  MRN: [id number]  Chief Complaint  Patient presents with  . Wound Check    Right lateral foot ulcer. Denies fever/chills/nausea/vomiting.    59 y.o. male presents for wound care. Hx confirmed with patient. Chronic ulcer Right foot, currently being seen at [facility] for this issue. Patient in wheelchair, immobile. Objective:  Physical Exam: Wound Location: Right lateral foot Wound Measurement: 4x2.t Wound Base: Granular/Healthy Peri-wound: Normal Exudate: Scant/small amount Serosanguinous exudate wound without warmth, erythema, signs of acute infection Assessment:   1. Ulcerated, foot, right, with fat layer exposed (HCC)      Plan:  Patient was evaluated and treated and all questions answered.  Ulcer Right foot -Discussed with pt and guardian that the wound does appear to be healing well without signs of acute infection. I am concerned about possible OM given previous depth. Order repeat XRs -I also discussed that at this point I do not think he needs surgical intervention and until that time he is in good hands with Dr. Leanord Hawking. -Defer to wound care for further care -Keep ID f/'u given previous culture. No active infection noted today -Dressed with silvadene and DSD.

## 2019-09-06 ENCOUNTER — Encounter: Payer: Self-pay | Admitting: Infectious Disease

## 2019-09-06 ENCOUNTER — Other Ambulatory Visit: Payer: Self-pay

## 2019-09-06 ENCOUNTER — Ambulatory Visit (INDEPENDENT_AMBULATORY_CARE_PROVIDER_SITE_OTHER): Payer: Medicare Other | Admitting: Infectious Disease

## 2019-09-06 DIAGNOSIS — G809 Cerebral palsy, unspecified: Secondary | ICD-10-CM | POA: Diagnosis not present

## 2019-09-06 DIAGNOSIS — G8250 Quadriplegia, unspecified: Secondary | ICD-10-CM

## 2019-09-06 DIAGNOSIS — A498 Other bacterial infections of unspecified site: Secondary | ICD-10-CM

## 2019-09-06 DIAGNOSIS — G808 Other cerebral palsy: Secondary | ICD-10-CM | POA: Diagnosis not present

## 2019-09-06 DIAGNOSIS — L97521 Non-pressure chronic ulcer of other part of left foot limited to breakdown of skin: Secondary | ICD-10-CM

## 2019-09-06 DIAGNOSIS — A4902 Methicillin resistant Staphylococcus aureus infection, unspecified site: Secondary | ICD-10-CM

## 2019-09-06 DIAGNOSIS — M86671 Other chronic osteomyelitis, right ankle and foot: Secondary | ICD-10-CM

## 2019-09-06 HISTORY — DX: Other bacterial infections of unspecified site: A49.8

## 2019-09-06 HISTORY — DX: Methicillin resistant Staphylococcus aureus infection, unspecified site: A49.02

## 2019-09-06 NOTE — Progress Notes (Signed)
Subjective:   Reason for infectious disease consult: Nonhealing wound on right foot  Requesting physician: Dr. Dellia Nims   Patient ID: [name], male    DOB: 06-16-1960, 59 y.o.   MRN: [id number]  HPI  [name] is a 59 year old Caucasian man with a history of cerebral palsy, bipolar disorder anxiety depression mental retardation who is quadriplegic and has been suffering from pressure ulcers over many years.  More recently he has been dealing with a ulcer on his right lateral foot which is failed to heal despite months if not greater than a year of being present.  He has had MRSA grow from the wound as well as Pseudomonas.  He is being followed closely of the wound center by Jeri Cos and Dr. Dellia Nims.  He was recently also seen by Dr. March Rummage with podiatry.  There is been concern about this wound failing to heal up and anxiety about osteomyelitis which is certainly understandable.  Plain films did not show evidence of osteomyelitis but I also have some anxiety about this.  He was treated with various oral antibiotics recently including clindamycin doxycycline.  He has not received antibiotics targeting Pseudomonas due to anxiety but QT prolongation in the context of him also being on Risperdal.  He is having topical aminoglycoside applied to the wound.  He is referred to [place] to assist in trying to help with management of his nonhealing wound.  I would like to first establish whether he truly has a deeper infection such as osteomyelitis which required surgical intervention.  If this is the case unfortunately will likely require a below the knee amputation given the location.  Interim I think we do not need to rush to give antimicrobials.  If we did I would be comfortable giving ciprofloxacin as long as we monitored his QT prolongation while he was on this and Risperdal.    Past Medical History:  Diagnosis Date  . Anxiety    takes Ativan daily as needed  . Bipolar disorder ([place])    takes  Depakote daily  . CEREBRAL PALSY 07/24/2006  . Chronic bronchitis ([place])    "gets it q yr"  . Constipation    takes Miralax daily as needed  . Depression    takes Risperdal nightly  . Environmental allergies    takes Zyrtec daily  . GASTRIC ULCER ACUTE WITH HEMORRHAGE 07/24/2006  . GERD (gastroesophageal reflux disease)    takes Pravacid daily  . History of blood transfusion    no abnormal reaction noted  . History of hiatal hernia   . History of shingles   . History of stomach ulcers ~ 1996  . Hypercholesterolemia   . Pneumonia "quite a few times"   in 2015 and in June 2016  . Recurrent UTI (urinary tract infection)   . URINARY INCONTINENCE 02/09/2010   takes Detrol daily-occasionally incontinence  . Weakness    numbness in extremities    Past Surgical History:  Procedure Laterality Date  . AMPUTATION Right 12/14/2014   Procedure: AMPUTATION DIGIT RIGHT GREAT TOE MTP;  Surgeon: Newt Minion, MD;  Location: [facility];  Service: Orthopedics;  Laterality: Right;  . BIOPSY  08/12/2018   Procedure: BIOPSY;  Surgeon: Jackquline Denmark, MD;  Location: [facility];  Service: Gastroenterology;;  . COLONOSCOPY WITH PROPOFOL N/A 08/14/2018   Procedure: COLONOSCOPY WITH PROPOFOL;  Surgeon: Jackquline Denmark, MD;  Location: [facility];  Service: Gastroenterology;  Laterality: N/A;  . ESOPHAGOGASTRODUODENOSCOPY (EGD) WITH PROPOFOL N/A 08/12/2018   Procedure:  ESOPHAGOGASTRODUODENOSCOPY (EGD) WITH PROPOFOL;  Surgeon: Lynann Bologna, MD;  Location: [facility];  Service: Gastroenterology;  Laterality: N/A;  . head surgery  as a baby   due to "water head"   . HERNIA REPAIR    . HIATAL HERNIA REPAIR  1982  . LEG SURGERY Bilateral ~ 1967   "legs were scissored; stretched them out"    Family History  Adopted: Yes      Social History   Socioeconomic History  . Marital status: Single    Spouse name: Not on file  . Number of children: Not on file  . Years of education: Not on file  . Highest  education level: Not on file  Occupational History  . Not on file  Tobacco Use  . Smoking status: Never Smoker  . Smokeless tobacco: Never Used  Substance and Sexual Activity  . Alcohol use: No  . Drug use: No  . Sexual activity: Never  Other Topics Concern  . Not on file  Social History Narrative  . Not on file   Social Determinants of Health   Financial Resource Strain:   . Difficulty of Paying Living Expenses:   Food Insecurity:   . Worried About Programme researcher, broadcasting/film/video in the Last Year:   . Barista in the Last Year:   Transportation Needs:   . Freight forwarder (Medical):   Marland Kitchen Lack of Transportation (Non-Medical):   Physical Activity:   . Days of Exercise per Week:   . Minutes of Exercise per Session:   Stress:   . Feeling of Stress :   Social Connections:   . Frequency of Communication with Friends and Family:   . Frequency of Social Gatherings with Friends and Family:   . Attends Religious Services:   . Active Member of Clubs or Organizations:   . Attends Banker Meetings:   Marland Kitchen Marital Status:     Allergies  Allergen Reactions  . Adhesive [Tape] Rash     Current Outpatient Medications:  .  acetaminophen (TYLENOL) 500 MG tablet, Take 1,000 mg by mouth every 6 (six) hours as needed for moderate pain., Disp: , Rfl:  .  alum & mag hydroxide-simeth (MYLANTA) 200-200-20 MG/5ML suspension, Take 30 mLs by mouth every 6 (six) hours as needed for indigestion or heartburn. Use as directed as needed for nausea/indigestion per standing order, Disp: , Rfl:  .  baclofen (LIORESAL) 20 MG tablet, Take 1 tablet (20 mg total) by mouth 3 (three) times daily., Disp: 90 each, Rfl: 1 .  brompheniramine-pseudoephedrine (DIMETAPP) 1-15 MG/5ML ELIX, Take 10 mLs by mouth 2 (two) times daily as needed (cold symptoms). Use as directed as needed for cold symptoms per standing orders, Disp: , Rfl:  .  cetirizine (ZYRTEC) 10 MG tablet, Take 1 tablet (10 mg total) by mouth  daily. (Patient taking differently: Take 10 mg by mouth at bedtime. ), Disp: 30 tablet, Rfl: 3 .  Cholecalciferol (VITAMIN D3) 5000 units CAPS, Take 5,000 Units by mouth daily., Disp: , Rfl:  .  clindamycin (CLEOCIN) 300 MG capsule, Take 300 mg by mouth every 6 (six) hours., Disp: , Rfl:  .  clotrimazole (LOTRIMIN) 1 % cream, Apply 1 application topically 2 (two) times daily., Disp: , Rfl:  .  divalproex (DEPAKOTE SPRINKLE) 125 MG capsule, Take 500 mg by mouth 2 (two) times daily. , Disp: , Rfl:  .  divalproex (DEPAKOTE SPRINKLE) 125 MG capsule, Take 500 mg by mouth 2 (two) times  daily., Disp: , Rfl:  .  doxycycline (VIBRAMYCIN) 100 MG capsule, Take 100 mg by mouth 2 (two) times daily., Disp: , Rfl:  .  EAR DROPS EARWAX AID 6.5 % OTIC solution, PLACE 5 DROPS INTO THE LEFT EAR TWICE DAILY (Patient taking differently: Place 5 drops into the left ear 2 (two) times daily. ), Disp: 15 mL, Rfl: 4 .  Elastic Bandages & Supports (JOBST ANTI-EM KNEE HIGH MED) MISC, by Does not apply route. Apply once in the morning and remove at bedtime daily, Disp: , Rfl:  .  Foot Care Products (PREVALON HEEL PROTECTOR) MISC, 1 Device by Does not apply route daily., Disp: 2 each, Rfl: 0 .  gentamicin cream (GARAMYCIN) 0.1 %, , Disp: , Rfl:  .  guaiFENesin-dextromethorphan (ROBITUSSIN DM) 100-10 MG/5ML syrup, Take 5 mLs by mouth every 4 (four) hours as needed for cough., Disp: , Rfl:  .  hydrocortisone cream 1 %, Apply 1 application topically at bedtime as needed for itching., Disp: 28 g, Rfl: 0 .  ketoconazole (NIZORAL) 2 % shampoo, APPLY TOPICALLY TO AFFECTED AREA(S) TWICE WEEKLY AS DIRECTED (Patient taking differently: Apply 1 application topically 2 (two) times a week. ), Disp: 120 mL, Rfl: 4 .  LORazepam (ATIVAN) 2 MG tablet, Take 1 mg by mouth every 4 (four) hours as needed for anxiety. , Disp: , Rfl:  .  Neomycin-Bacitracin-Polymyxin (TRIPLE ANTIBIOTIC EX), Apply 1 application topically as needed  (cuts/scrapes/scratches)., Disp: , Rfl:  .  omeprazole (PRILOSEC) 40 MG capsule, Take 1 capsule (40 mg total) by mouth 2 (two) times daily., Disp: 60 capsule, Rfl: 0 .  Oyster Shell (OYSTER CALCIUM) 500 MG TABS tablet, Take 1,500 mg of elemental calcium by mouth daily., Disp: , Rfl:  .  polyethylene glycol (MIRALAX) 17 g packet, Take 17g by mouth daily as needed. Please do not give if having soft, regular bowel movements., Disp: 100 each, Rfl: 0 .  risperiDONE (RISPERDAL) 2 MG tablet, Take 2 mg by mouth at bedtime., Disp: , Rfl:  .  SANTYL ointment, Apply 1 application topically daily., Disp: , Rfl:  .  tamsulosin (FLOMAX) 0.4 MG CAPS capsule, TAKE 1 CAPSULE BY MOUTH EVERY DAY *DO NOT CRUSH* (Patient taking differently: Take 0.4 mg by mouth daily. ), Disp: 30 capsule, Rfl: 2 .  tiZANidine (ZANAFLEX) 2 MG tablet, Take 2 mg by mouth 3 (three) times daily. , Disp: , Rfl:  .  tolterodine (DETROL LA) 2 MG 24 hr capsule, Take 1 capsule (2 mg total) by mouth daily. (Patient taking differently: Take 2 mg by mouth daily at 6 PM. ), Disp: 90 capsule, Rfl: 3 .  tretinoin (RETIN-A) 0.025 % gel, Apply 1 application topically at bedtime., Disp: , Rfl:  .  vitamin C (VITAMIN C) 250 MG tablet, Take 1 tablet (250 mg total) by mouth daily., Disp: 30 tablet, Rfl: 0 .  VOLTAREN 1 % GEL, APPLY 4 GRAMS TOPICALLY TO AFFECTED AREA(S) FOUR TIMES DAILY, Disp: 100 g, Rfl: 11 .  Zinc Oxide 40 % PSTE, Apply thin layer topically to buttocks and scrotum BID as needed., Disp: 113 g, Rfl: 3   Review of Systems  Unable to perform ROS: Dementia       Objective:   Physical Exam Constitutional:      Appearance: He is well-developed.  HENT:     Head: Normocephalic and atraumatic.  Cardiovascular:     Rate and Rhythm: Normal rate and regular rhythm.  Pulmonary:     Effort: Pulmonary effort  is normal. No respiratory distress.     Breath sounds: No wheezing.  Abdominal:     General: There is no distension.     Palpations:  Abdomen is soft.  Musculoskeletal:        General: Deformity present. No tenderness. Normal range of motion.     Cervical back: Normal range of motion and neck supple.  Skin:    General: Skin is warm and dry.     Coloration: Skin is not pale.     Findings: No erythema or rash.  Neurological:     Mental Status: He is alert.    He is diplegic with multiple contractures.  Pleasant agreeable and cooperative  Skin right foot picture taken September 06, 2019:            Assessment & Plan:   Nonhealing wound of right foot: He has had MRSA isolated them here as well as Pseudomonas aeruginosa repeatedly.  He has not received systemic antibiotics for Pseudomonas but only topical antibiotics due to anxiety about fluoroquinolone induced QT prolongation in the context of Risperdal.  Plain films of failed to show evidence of osteomyelitis but I think we need to exclude it with a more aggressive sensitive study.  We will check a sed rate CRP metabolic panel CBC with differential and wearing an MRI with contrast at [facility].  Hopefully can have the MRI back soon in the next week or 2 and I will plan on seeing him in 1 month if this MRI is stable.  I want to withhold initiating antimicrobials because I want to know if he has a deep infection first.

## 2019-09-07 LAB — CBC WITH DIFFERENTIAL/PLATELET
Absolute Monocytes: 600 cells/uL (ref 200–950)
Basophils Absolute: 87 cells/uL (ref 0–200)
Basophils Relative: 1.1 %
Eosinophils Absolute: 71 cells/uL (ref 15–500)
Eosinophils Relative: 0.9 %
HCT: 34.3 % — ABNORMAL LOW (ref 38.5–50.0)
Hemoglobin: 10.8 g/dL — ABNORMAL LOW (ref 13.2–17.1)
Lymphs Abs: 2433 cells/uL (ref 850–3900)
MCH: 24.9 pg — ABNORMAL LOW (ref 27.0–33.0)
MCHC: 31.5 g/dL — ABNORMAL LOW (ref 32.0–36.0)
MCV: 79 fL — ABNORMAL LOW (ref 80.0–100.0)
MPV: 10.3 fL (ref 7.5–12.5)
Monocytes Relative: 7.6 %
Neutro Abs: 4708 cells/uL (ref 1500–7800)
Neutrophils Relative %: 59.6 %
Platelets: 388 10*3/uL (ref 140–400)
RBC: 4.34 10*6/uL (ref 4.20–5.80)
RDW: 16 % — ABNORMAL HIGH (ref 11.0–15.0)
Total Lymphocyte: 30.8 %
WBC: 7.9 10*3/uL (ref 3.8–10.8)

## 2019-09-07 LAB — BASIC METABOLIC PANEL WITH GFR
BUN/Creatinine Ratio: 42 (calc) — ABNORMAL HIGH (ref 6–22)
BUN: 20 mg/dL (ref 7–25)
CO2: 26 mmol/L (ref 20–32)
Calcium: 9.2 mg/dL (ref 8.6–10.3)
Chloride: 106 mmol/L (ref 98–110)
Creat: 0.48 mg/dL — ABNORMAL LOW (ref 0.70–1.33)
GFR, Est African American: 140 mL/min/{1.73_m2} (ref >=60)
GFR, Est Non African American: 121 mL/min/{1.73_m2} (ref >=60)
Glucose, Bld: 99 mg/dL (ref 65–99)
Potassium: 4.1 mmol/L (ref 3.5–5.3)
Sodium: 139 mmol/L (ref 135–146)

## 2019-09-07 LAB — C-REACTIVE PROTEIN: CRP: 1.8 mg/L (ref <8.0)

## 2019-09-07 LAB — SEDIMENTATION RATE: Sed Rate: 22 mm/h — ABNORMAL HIGH (ref 0–20)

## 2019-09-14 ENCOUNTER — Other Ambulatory Visit: Payer: Self-pay

## 2019-09-14 ENCOUNTER — Encounter (HOSPITAL_BASED_OUTPATIENT_CLINIC_OR_DEPARTMENT_OTHER): Payer: Medicare Other | Admitting: Internal Medicine

## 2019-09-14 DIAGNOSIS — L89892 Pressure ulcer of other site, stage 2: Secondary | ICD-10-CM | POA: Diagnosis not present

## 2019-09-14 DIAGNOSIS — L03115 Cellulitis of right lower limb: Secondary | ICD-10-CM | POA: Diagnosis not present

## 2019-09-14 DIAGNOSIS — G808 Other cerebral palsy: Secondary | ICD-10-CM | POA: Diagnosis not present

## 2019-09-14 DIAGNOSIS — L89522 Pressure ulcer of left ankle, stage 2: Secondary | ICD-10-CM | POA: Diagnosis present

## 2019-09-14 DIAGNOSIS — L89893 Pressure ulcer of other site, stage 3: Secondary | ICD-10-CM | POA: Diagnosis not present

## 2019-09-14 DIAGNOSIS — I739 Peripheral vascular disease, unspecified: Secondary | ICD-10-CM | POA: Diagnosis not present

## 2019-09-14 NOTE — Progress Notes (Signed)
Signed: 09/14/2019 5:39:56 PM By: Carlene Coria RN Entered By: Carlene Coria on 09/14/2019 10:46:39 -------------------------------------------------------------------------------- Wound Assessment Details Patient Name: Date of Service: [name], UHDE 09/14/2019 9:45 AM Medical Record [id number] Patient Account Number: [id number] Date of Birth/Sex: Treating RN: 06/17/1960 (59 y.o. [name]) Carlene Coria Primary Care Conrad Zajkowski: [name] Other Clinician: Referring Lissy Deuser: Treating Cerena Baine/Extender:Robson, Providence Lanius, Randon Goldsmith in Treatment: 113 Wound Status Wound Number: 37 Primary Pressure Ulcer Etiology: Wound Location: Right, Lateral Foot Wound Open Wounding Event: Pressure Injury Status: Date Acquired: 05/24/2019 Comorbid Deep Vein Thrombosis, Colitis, Osteomyelitis, Weeks Of Treatment: 11 History: Neuropathy, Quadriplegia, Confinement Clustered Wound: No Anxiety Wound Measurements Length: (cm) 4 % Reduction in Width: (cm) 2.5 % Reduction in Depth: (cm) 0.1 Epithelializati Area: (cm) 7.854 Tunneling: Volume: (cm) 0.785 Undermining: Wound Description Classification: Category/Stage III Foul Odor After Wound Margin: Flat and Intact Slough/Fibrino Exudate Amount: Medium Exudate Type: Serosanguineous Exudate Color: red, brown Wound Bed Granulation Amount: Large (67-100%) Granulation Quality: Red Fascia Exposed: Necrotic Amount: Small (1-33%) Fat Layer (Subc Necrotic Quality: Adherent Slough Tendon Exposed: Muscle Exposed: Joint Exposed: Bone Exposed: Cleansing: No Yes Exposed Structure No utaneous Tissue) Exposed: Yes No No No No Area: -24.2% Volume: 37.9% on: Small (1-33%) No No Treatment Notes Wound #37 (Right, Lateral Foot) 1. Cleanse With Wound Cleanser Soap and water 2. Periwound Care Moisturizing lotion 3. Primary Dressing Applied Calcium Alginate Ag Hydrofera Blue 4. Secondary  Dressing Dry Gauze 6. Support Layer Applied Kerlix/Coban Notes silver alginate to toe and hydrofera blue to lateral foot Electronic Signature(s) Signed: 09/14/2019 5:39:56 PM By: Carlene Coria RN Signed: 09/14/2019 5:57:12 PM By: Kela Millin Entered By: Kela Millin on 09/14/2019 10:36:29 -------------------------------------------------------------------------------- Wound Assessment Details Patient Name: Date of Service: [name], BON 09/14/2019 9:45 AM Medical Record [id number] Patient Account Number: [id number] Date of Birth/Sex: Treating RN: Aug 07, 1960 (59 y.o. [name]) Carlene Coria Primary Care [name]: [name] Other Clinician: Referring Bear Osten: Treating Geriann Lafont/Extender:Robson, Providence Lanius, Randon Goldsmith in Treatment: 113 Wound Status Wound Number: 38 Primary Pressure Ulcer Etiology: Wound Location: Right Toe Fifth Wound Open Wounding Event: Shear/Friction Status: Date Acquired: 07/21/2019 Comorbid Deep Vein Thrombosis, Colitis, Osteomyelitis, Weeks Of Treatment: 7 History: Neuropathy, Quadriplegia, Confinement Clustered Wound: No Anxiety Wound Measurements Length: (cm) 0.4 % Red Width: (cm) 0.5 % Red Depth: (cm) 0.1 Epith Area: (cm) 0.157 Tunn Volume: (cm) 0.016 Unde Wound Description Classification: Category/Stage II Wound Margin: Distinct, outline attached Exudate Amount: Small Exudate Type: Serosanguineous Exudate Color: red, brown Wound Bed Granulation Amount: Large (67-100%) Granulation Quality: Red Necrotic Amount: None Present (0%) Foul Odor After Cleansing: No Slough/Fibrino No Exposed Structure Fascia Exposed: No Fat Layer (Subcutaneous Tissue) Exposed: Yes Tendon Exposed: No Muscle Exposed: No Joint Exposed: No Bone Exposed: No uction in Area: 64.3% uction in Volume: 81.8% elialization: Large (67-100%) eling: No rmining: No Treatment Notes Wound #38 (Right Toe Fifth) 1. Cleanse With Wound Cleanser Soap  and water 2. Periwound Care Moisturizing lotion 3. Primary Dressing Applied Calcium Alginate Ag Hydrofera Blue 4. Secondary Dressing Dry Gauze 6. Support Layer Applied Kerlix/Coban Notes silver alginate to toe and hydrofera blue to lateral foot Electronic Signature(s) Signed: 09/14/2019 5:39:56 PM By: Carlene Coria RN Signed: 09/14/2019 5:57:12 PM By: Kela Millin Entered By: Kela Millin on 09/14/2019 10:37:10 -------------------------------------------------------------------------------- Vitals Details Patient Name: Date of Service: [name], MCNEALY 09/14/2019 9:45 AM Medical Record [id number] Patient Account Number: [id number] Date of Birth/Sex: Treating RN: 1960/08/08 (59 y.o. [name] Primary Care [name]: [name] Other Clinician: Referring [name]: Treating Kwadwo Taras/Extender:Robson, Legrand Como  Volume (cm) : [37:0.785] [38:0.016] [N/A:N/A] % Reduction in Area: [37:-24.20%] [38:64.30%] [N/A:N/A] % Reduction in Volume: 37.90% [38:81.80%] [N/A:N/A] Classification: [37:Category/Stage III] [38:Category/Stage II] [N/A:N/A] Exudate Amount: [37:Medium] [38:Small] [N/A:N/A] Exudate Type: [37:Serosanguineous] [38:Serosanguineous] [N/A:N/A] Exudate Color: [37:red, brown] [38:red, brown] [N/A:N/A] Wound Margin: [37:Flat and Intact] [38:Distinct, outline attached] [N/A:N/A] Granulation Amount: [37:Large (67-100%)] [38:Large (67-100%)] [N/A:N/A] Granulation Quality: [37:Red] [38:Red] [N/A:N/A] Necrotic Amount: [37:Small (1-33%)] [38:None Present (0%)] [N/A:N/A] Exposed Structures: [37:Fat Layer (Subcutaneous Tissue) Exposed: Yes Fascia: No Tendon: No Muscle: No Joint: No Bone: No Small (1-33%)] [38:Fat Layer (Subcutaneous Tissue) Exposed: Yes Fascia: No Tendon: No Muscle: No Joint: No Bone: No Large (67-100%)] [N/A:N/A N/A] Treatment Notes Electronic Signature(s) Signed: 09/14/2019 5:39:56 PM By: Carlene Coria RN Signed: 09/14/2019 5:47:38 PM By: Linton Ham MD Entered By: Linton Ham on  09/14/2019 11:40:36 -------------------------------------------------------------------------------- Multi-Disciplinary Care Plan Details Patient Name: Date of Service: [name], [name] 09/14/2019 9:45 AM Medical Record [id number] Patient Account Number: [id number] Date of Birth/Sex: Treating RN: Dec 19, 1960 (59 y.o. Staci Acosta, Morey Hummingbird Primary Care Ashlyn Cabler: [name] Other Clinician: Referring Joseguadalupe Stan: Treating Michaiah Holsopple/Extender:Robson, Providence Lanius, Randon Goldsmith in Treatment: 113 Active Inactive Soft Tissue Infection Nursing Diagnoses: Impaired tissue integrity Knowledge deficit related to disease process and management Knowledge deficit related to home infection control: handwashing, handling of soiled dressings, supply storage Goals: Patient's soft tissue infection will resolve Date Initiated: 08/18/2019 Target Resolution Date: 09/15/2019 Goal Status: Active Interventions: Assess signs and symptoms of infection every visit Provide education on infection Treatment Activities: Culture : 08/18/2019 Culture and sensitivity : 08/18/2019 Education provided on Infection : 08/31/2019 Notes: Wound/Skin Impairment Nursing Diagnoses: Impaired tissue integrity Knowledge deficit related to ulceration/compromised skin integrity Goals: Patient/caregiver will verbalize understanding of skin care regimen Date Initiated: 07/14/2017 Target Resolution Date: 09/15/2019 Goal Status: Active Ulcer/skin breakdown will have a volume reduction of 30% by week 4 Date Initiated: 07/14/2017 Date Inactivated: 08/18/2017 Target Resolution Date: 08/15/2017 Goal Status: Met Ulcer/skin breakdown will have a volume reduction of 50% by week 8 Date Initiated: 07/14/2017 Date Inactivated: 10/09/2017 Target Resolution Date: 09/19/2017 Goal Status: Unmet Unmet Reason: infection Ulcer/skin breakdown will heal within 14 weeks Date Initiated: 07/14/2017 Date Inactivated: 10/24/2017 Target Resolution Date:  10/24/2017 Unmet Reason: complex Goal Status: Unmet comorbidities Interventions: Assess patient/caregiver ability to obtain necessary supplies Assess patient/caregiver ability to perform ulcer/skin care regimen upon admission and as needed Assess ulceration(s) every visit Provide education on ulcer and skin care Notes: Electronic Signature(s) Signed: 09/14/2019 5:39:56 PM By: Carlene Coria RN Entered By: Carlene Coria on 09/14/2019 10:46:14 -------------------------------------------------------------------------------- Pain Assessment Details Patient Name: Date of Service: [name], KOORS 09/14/2019 9:45 AM Medical Record [id number] Patient Account Number: [id number] Date of Birth/Sex: Treating RN: [birth year] (59 y.o. [name]) Carlene Coria Primary Care Marice Angelino: [name] Other Clinician: Referring Ch[place] Goh: Treating Brier Firebaugh/Extender:Robson, Providence Lanius, Randon Goldsmith in Treatment: 113 Active Problems Location of Pain Severity and Description of Pain Patient Has Paino No Site Locations Pain Management and Medication Current Pain Management: Electronic Signature(s) Signed: 09/14/2019 1:29:28 PM By: Sandre Kitty Signed: 09/14/2019 5:39:56 PM By: Carlene Coria RN Entered By: Sandre Kitty on 09/14/2019 10:19:03 -------------------------------------------------------------------------------- Patient/Caregiver Education Details Patient Name: Date of Service: [name] [place] 4/20/2021andnbsp9:45 AM Medical Record [id number] Patient Account Number: [id number] Date of Birth/Gender: [birth year] (59 y.o. M) Treating RN: Carlene Coria Primary Care Physician: [name] Other Clinician: Referring Physician: Treating Physician/Extender:Robson, Providence Lanius, Randon Goldsmith in Treatment: 36 Education Assessment Education Provided To: Patient Education Topics Provided Wound/Skin Impairment: Methods: Explain/Verbal Responses: State content  correctly Electronic Signature(s)  Signed: 09/14/2019 5:39:56 PM By: Carlene Coria RN Entered By: Carlene Coria on 09/14/2019 10:46:39 -------------------------------------------------------------------------------- Wound Assessment Details Patient Name: Date of Service: [name], UHDE 09/14/2019 9:45 AM Medical Record [id number] Patient Account Number: [id number] Date of Birth/Sex: Treating RN: 06/17/1960 (59 y.o. [name]) Carlene Coria Primary Care Conrad Zajkowski: [name] Other Clinician: Referring Lissy Deuser: Treating Cerena Baine/Extender:Robson, Providence Lanius, Randon Goldsmith in Treatment: 113 Wound Status Wound Number: 37 Primary Pressure Ulcer Etiology: Wound Location: Right, Lateral Foot Wound Open Wounding Event: Pressure Injury Status: Date Acquired: 05/24/2019 Comorbid Deep Vein Thrombosis, Colitis, Osteomyelitis, Weeks Of Treatment: 11 History: Neuropathy, Quadriplegia, Confinement Clustered Wound: No Anxiety Wound Measurements Length: (cm) 4 % Reduction in Width: (cm) 2.5 % Reduction in Depth: (cm) 0.1 Epithelializati Area: (cm) 7.854 Tunneling: Volume: (cm) 0.785 Undermining: Wound Description Classification: Category/Stage III Foul Odor After Wound Margin: Flat and Intact Slough/Fibrino Exudate Amount: Medium Exudate Type: Serosanguineous Exudate Color: red, brown Wound Bed Granulation Amount: Large (67-100%) Granulation Quality: Red Fascia Exposed: Necrotic Amount: Small (1-33%) Fat Layer (Subc Necrotic Quality: Adherent Slough Tendon Exposed: Muscle Exposed: Joint Exposed: Bone Exposed: Cleansing: No Yes Exposed Structure No utaneous Tissue) Exposed: Yes No No No No Area: -24.2% Volume: 37.9% on: Small (1-33%) No No Treatment Notes Wound #37 (Right, Lateral Foot) 1. Cleanse With Wound Cleanser Soap and water 2. Periwound Care Moisturizing lotion 3. Primary Dressing Applied Calcium Alginate Ag Hydrofera Blue 4. Secondary  Dressing Dry Gauze 6. Support Layer Applied Kerlix/Coban Notes silver alginate to toe and hydrofera blue to lateral foot Electronic Signature(s) Signed: 09/14/2019 5:39:56 PM By: Carlene Coria RN Signed: 09/14/2019 5:57:12 PM By: Kela Millin Entered By: Kela Millin on 09/14/2019 10:36:29 -------------------------------------------------------------------------------- Wound Assessment Details Patient Name: Date of Service: [name], BON 09/14/2019 9:45 AM Medical Record [id number] Patient Account Number: [id number] Date of Birth/Sex: Treating RN: Aug 07, 1960 (59 y.o. [name]) Carlene Coria Primary Care [name]: [name] Other Clinician: Referring Bear Osten: Treating Geriann Lafont/Extender:Robson, Providence Lanius, Randon Goldsmith in Treatment: 113 Wound Status Wound Number: 38 Primary Pressure Ulcer Etiology: Wound Location: Right Toe Fifth Wound Open Wounding Event: Shear/Friction Status: Date Acquired: 07/21/2019 Comorbid Deep Vein Thrombosis, Colitis, Osteomyelitis, Weeks Of Treatment: 7 History: Neuropathy, Quadriplegia, Confinement Clustered Wound: No Anxiety Wound Measurements Length: (cm) 0.4 % Red Width: (cm) 0.5 % Red Depth: (cm) 0.1 Epith Area: (cm) 0.157 Tunn Volume: (cm) 0.016 Unde Wound Description Classification: Category/Stage II Wound Margin: Distinct, outline attached Exudate Amount: Small Exudate Type: Serosanguineous Exudate Color: red, brown Wound Bed Granulation Amount: Large (67-100%) Granulation Quality: Red Necrotic Amount: None Present (0%) Foul Odor After Cleansing: No Slough/Fibrino No Exposed Structure Fascia Exposed: No Fat Layer (Subcutaneous Tissue) Exposed: Yes Tendon Exposed: No Muscle Exposed: No Joint Exposed: No Bone Exposed: No uction in Area: 64.3% uction in Volume: 81.8% elialization: Large (67-100%) eling: No rmining: No Treatment Notes Wound #38 (Right Toe Fifth) 1. Cleanse With Wound Cleanser Soap  and water 2. Periwound Care Moisturizing lotion 3. Primary Dressing Applied Calcium Alginate Ag Hydrofera Blue 4. Secondary Dressing Dry Gauze 6. Support Layer Applied Kerlix/Coban Notes silver alginate to toe and hydrofera blue to lateral foot Electronic Signature(s) Signed: 09/14/2019 5:39:56 PM By: Carlene Coria RN Signed: 09/14/2019 5:57:12 PM By: Kela Millin Entered By: Kela Millin on 09/14/2019 10:37:10 -------------------------------------------------------------------------------- Vitals Details Patient Name: Date of Service: [name], MCNEALY 09/14/2019 9:45 AM Medical Record [id number] Patient Account Number: [id number] Date of Birth/Sex: Treating RN: 1960/08/08 (59 y.o. [name] Primary Care [name]: [name] Other Clinician: Referring [name]: Treating Kwadwo Taras/Extender:Robson, Legrand Como  Signed: 09/14/2019 5:39:56 PM By: Carlene Coria RN Entered By: Carlene Coria on 09/14/2019 10:46:39 -------------------------------------------------------------------------------- Wound Assessment Details Patient Name: Date of Service: [name], UHDE 09/14/2019 9:45 AM Medical Record [id number] Patient Account Number: [id number] Date of Birth/Sex: Treating RN: 06/17/1960 (59 y.o. [name]) Carlene Coria Primary Care Conrad Zajkowski: [name] Other Clinician: Referring Lissy Deuser: Treating Cerena Baine/Extender:Robson, [place] Lanius, Randon Goldsmith in Treatment: 113 Wound Status Wound Number: 37 Primary Pressure Ulcer Etiology: Wound Location: Right, Lateral Foot Wound Open Wounding Event: Pressure Injury Status: Date Acquired: 05/24/2019 Comorbid Deep Vein Thrombosis, Colitis, Osteomyelitis, Weeks Of Treatment: 11 History: Neuropathy, Quadriplegia, Confinement Clustered Wound: No Anxiety Wound Measurements Length: (cm) 4 % Reduction in Width: (cm) 2.5 % Reduction in Depth: (cm) 0.1 Epithelializati Area: (cm) 7.854 Tunneling: Volume: (cm) 0.785 Undermining: Wound Description Classification: Category/Stage III Foul Odor After Wound Margin: Flat and Intact Slough/Fibrino Exudate Amount: Medium Exudate Type: Serosanguineous Exudate Color: red, brown Wound Bed Granulation Amount: Large (67-100%) Granulation Quality: Red Fascia Exposed: Necrotic Amount: Small (1-33%) Fat Layer (Subc Necrotic Quality: Adherent Slough Tendon Exposed: Muscle Exposed: Joint Exposed: Bone Exposed: Cleansing: No Yes Exposed Structure No utaneous Tissue) Exposed: Yes No No No No Area: -24.2% Volume: 37.9% on: Small (1-33%) No No Treatment Notes Wound #37 (Right, Lateral Foot) 1. Cleanse With Wound Cleanser Soap and water 2. Periwound Care Moisturizing lotion 3. Primary Dressing Applied Calcium Alginate Ag Hydrofera Blue 4. Secondary  Dressing Dry Gauze 6. Support Layer Applied Kerlix/Coban Notes silver alginate to toe and hydrofera blue to lateral foot Electronic Signature(s) Signed: 09/14/2019 5:39:56 PM By: Carlene Coria RN Signed: 09/14/2019 5:57:12 PM By: Kela Millin Entered By: Kela Millin on 09/14/2019 10:36:29 -------------------------------------------------------------------------------- Wound Assessment Details Patient Name: Date of Service: [name], BON 09/14/2019 9:45 AM Medical Record [id number] Patient Account Number: [id number] Date of Birth/Sex: Treating RN: Aug 07, 1960 (59 y.o. [name]) Carlene Coria Primary Care [name]: [name] Other Clinician: Referring Bear Osten: Treating Geriann Lafont/Extender:Robson, Providence Lanius, Randon Goldsmith in Treatment: 113 Wound Status Wound Number: 38 Primary Pressure Ulcer Etiology: Wound Location: Right Toe Fifth Wound Open Wounding Event: Shear/Friction Status: Date Acquired: 07/21/2019 Comorbid Deep Vein Thrombosis, Colitis, Osteomyelitis, Weeks Of Treatment: 7 History: Neuropathy, Quadriplegia, Confinement Clustered Wound: No Anxiety Wound Measurements Length: (cm) 0.4 % Red Width: (cm) 0.5 % Red Depth: (cm) 0.1 Epith Area: (cm) 0.157 Tunn Volume: (cm) 0.016 Unde Wound Description Classification: Category/Stage II Wound Margin: Distinct, outline attached Exudate Amount: Small Exudate Type: Serosanguineous Exudate Color: red, brown Wound Bed Granulation Amount: Large (67-100%) Granulation Quality: Red Necrotic Amount: None Present (0%) Foul Odor After Cleansing: No Slough/Fibrino No Exposed Structure Fascia Exposed: No Fat Layer (Subcutaneous Tissue) Exposed: Yes Tendon Exposed: No Muscle Exposed: No Joint Exposed: No Bone Exposed: No uction in Area: 64.3% uction in Volume: 81.8% elialization: Large (67-100%) eling: No rmining: No Treatment Notes Wound #38 (Right Toe Fifth) 1. Cleanse With Wound Cleanser Soap  and water 2. Periwound Care Moisturizing lotion 3. Primary Dressing Applied Calcium Alginate Ag Hydrofera Blue 4. Secondary Dressing Dry Gauze 6. Support Layer Applied Kerlix/Coban Notes silver alginate to toe and hydrofera blue to lateral foot Electronic Signature(s) Signed: 09/14/2019 5:39:56 PM By: Carlene Coria RN Signed: 09/14/2019 5:57:12 PM By: Kela Millin Entered By: Kela Millin on 09/14/2019 10:37:10 -------------------------------------------------------------------------------- Vitals Details Patient Name: Date of Service: [name], MCNEALY 09/14/2019 9:45 AM Medical Record [id number] Patient Account Number: [id number] Date of Birth/Sex: Treating RN: 1960/08/08 (59 y.o. [name] Primary Care [name]: [name] Other Clinician: Referring [name]: Treating Kwadwo Taras/Extender:Robson, Legrand Como  Volume (cm) : [37:0.785] [38:0.016] [N/A:N/A] % Reduction in Area: [37:-24.20%] [38:64.30%] [N/A:N/A] % Reduction in Volume: 37.90% [38:81.80%] [N/A:N/A] Classification: [37:Category/Stage III] [38:Category/Stage II] [N/A:N/A] Exudate Amount: [37:Medium] [38:Small] [N/A:N/A] Exudate Type: [37:Serosanguineous] [38:Serosanguineous] [N/A:N/A] Exudate Color: [37:red, brown] [38:red, brown] [N/A:N/A] Wound Margin: [37:Flat and Intact] [38:Distinct, outline attached] [N/A:N/A] Granulation Amount: [37:Large (67-100%)] [38:Large (67-100%)] [N/A:N/A] Granulation Quality: [37:Red] [38:Red] [N/A:N/A] Necrotic Amount: [37:Small (1-33%)] [38:None Present (0%)] [N/A:N/A] Exposed Structures: [37:Fat Layer (Subcutaneous Tissue) Exposed: Yes Fascia: No Tendon: No Muscle: No Joint: No Bone: No Small (1-33%)] [38:Fat Layer (Subcutaneous Tissue) Exposed: Yes Fascia: No Tendon: No Muscle: No Joint: No Bone: No Large (67-100%)] [N/A:N/A N/A] Treatment Notes Electronic Signature(s) Signed: 09/14/2019 5:39:56 PM By: Carlene Coria RN Signed: 09/14/2019 5:47:38 PM By: Linton Ham MD Entered By: Linton Ham on  09/14/2019 11:40:36 -------------------------------------------------------------------------------- Multi-Disciplinary Care Plan Details Patient Name: Date of Service: [name], [name] 09/14/2019 9:45 AM Medical Record [id number] Patient Account Number: [id number] Date of Birth/Sex: Treating RN: Dec 19, 1960 (59 y.o. Staci Acosta, Morey Hummingbird Primary Care Ashlyn Cabler: [name] Other Clinician: Referring Joseguadalupe Stan: Treating Michaiah Holsopple/Extender:Robson, Providence Lanius, Randon Goldsmith in Treatment: 113 Active Inactive Soft Tissue Infection Nursing Diagnoses: Impaired tissue integrity Knowledge deficit related to disease process and management Knowledge deficit related to home infection control: handwashing, handling of soiled dressings, supply storage Goals: Patient's soft tissue infection will resolve Date Initiated: 08/18/2019 Target Resolution Date: 09/15/2019 Goal Status: Active Interventions: Assess signs and symptoms of infection every visit Provide education on infection Treatment Activities: Culture : 08/18/2019 Culture and sensitivity : 08/18/2019 Education provided on Infection : 08/31/2019 Notes: Wound/Skin Impairment Nursing Diagnoses: Impaired tissue integrity Knowledge deficit related to ulceration/compromised skin integrity Goals: Patient/caregiver will verbalize understanding of skin care regimen Date Initiated: 07/14/2017 Target Resolution Date: 09/15/2019 Goal Status: Active Ulcer/skin breakdown will have a volume reduction of 30% by week 4 Date Initiated: 07/14/2017 Date Inactivated: 08/18/2017 Target Resolution Date: 08/15/2017 Goal Status: Met Ulcer/skin breakdown will have a volume reduction of 50% by week 8 Date Initiated: 07/14/2017 Date Inactivated: 10/09/2017 Target Resolution Date: 09/19/2017 Goal Status: Unmet Unmet Reason: infection Ulcer/skin breakdown will heal within 14 weeks Date Initiated: 07/14/2017 Date Inactivated: 10/24/2017 Target Resolution Date:  10/24/2017 Unmet Reason: complex Goal Status: Unmet comorbidities Interventions: Assess patient/caregiver ability to obtain necessary supplies Assess patient/caregiver ability to perform ulcer/skin care regimen upon admission and as needed Assess ulceration(s) every visit Provide education on ulcer and skin care Notes: Electronic Signature(s) Signed: 09/14/2019 5:39:56 PM By: Carlene Coria RN Entered By: Carlene Coria on 09/14/2019 10:46:14 -------------------------------------------------------------------------------- Pain Assessment Details Patient Name: Date of Service: [name], KOORS 09/14/2019 9:45 AM Medical Record [id number] Patient Account Number: [id number] Date of Birth/Sex: Treating RN: [birth year] (59 y.o. [name]) Carlene Coria Primary Care Marice Angelino: [name] Other Clinician: Referring Ch[place] Goh: Treating Brier Firebaugh/Extender:Robson, Providence Lanius, Randon Goldsmith in Treatment: 113 Active Problems Location of Pain Severity and Description of Pain Patient Has Paino No Site Locations Pain Management and Medication Current Pain Management: Electronic Signature(s) Signed: 09/14/2019 1:29:28 PM By: Sandre Kitty Signed: 09/14/2019 5:39:56 PM By: Carlene Coria RN Entered By: Sandre Kitty on 09/14/2019 10:19:03 -------------------------------------------------------------------------------- Patient/Caregiver Education Details Patient Name: Date of Service: [name] [place] 4/20/2021andnbsp9:45 AM Medical Record [id number] Patient Account Number: [id number] Date of Birth/Gender: [birth year] (59 y.o. M) Treating RN: Carlene Coria Primary Care Physician: [name] Other Clinician: Referring Physician: Treating Physician/Extender:Robson, Providence Lanius, Randon Goldsmith in Treatment: 36 Education Assessment Education Provided To: Patient Education Topics Provided Wound/Skin Impairment: Methods: Explain/Verbal Responses: State content  correctly Electronic Signature(s)

## 2019-09-14 NOTE — Progress Notes (Signed)
of Birth/Sex: Treating RN: 1961-03-27 (58 y.o. 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, stage 3 I73.89 Other specified peripheral vascular diseases L03.115 Cellulitis of right lower limb Facility Procedures CPT4 Code: 47829562 Description: 99214 - WOUND CARE VISIT-LEV 4 EST PT Modifier: Quantity: 1 Physician Procedures CPT4 Code: 1308657 Description: 99214 - WC PHYS LEVEL 4 - EST PT ICD-10 Diagnosis Description L03.115 Cellulitis of right lower limb L89.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, stage 3 I73.89 Other specified peripheral vascular diseases L03.115 Cellulitis of right lower limb Facility Procedures CPT4 Code: [id number] Description: 99214 - WOUND CARE VISIT-LEV 4 EST PT Modifier: Quantity: 1 Physician Procedures CPT4 Code: 1308657 Description: 99214 - WC PHYS LEVEL 4 - EST PT ICD-10 Diagnosis Description L03.115 Cellulitis of right lower limb L89.893 Pressure ulcer of other site, stage 3 L89.892 Pressure ulcer of other site, stage 2 Modifier: Quantity: 1 Electronic Signature(s) Signed: 09/14/2019 5:47:38 PM By: Baltazar Najjar MD Entered By: Baltazar Najjar on 09/14/2019 11:45:53  [phone] License #: [id number] Patient Address: [facility] [address] [place], [place] [address] [address] [place], [place] [address] [phone] Allergies adhesive tape Reaction: rash Severity: Moderate Medication Medication: Route: Strength: Form: cefdinir oral 300 mg capsule Class: CEPHALOSPORIN ANTIBIOTICS - 3RD GENERATION Dose: Frequency / Time: Indication: 1 1 capsule oral, 2 times per day Number of Refills: Number of Units: 0 Generic Substitution: Start Date: End Date: Administered at Substitution Permitted 09/14/2019 Facility: No Note to Pharmacy: Signature(s): Date(s): Electronic Signature(s) Signed: 09/14/2019 5:39:56 PM By: [name] Pax RN Signed: 09/14/2019 5:47:38 PM By: Baltazar Najjar MD Entered By: [name] on 09/14/2019 11:18:55 --------------------------------------------------------------------------------  Problem List Details Patient Name: Date of Service: [name], [name] 09/14/2019 9:45 AM Medical Record [id number] Patient Account Number: [id number] Date of Birth/Sex: Treating RN: 09-Jul-1960 (58 y.o. [name] Primary Care Provider: Orpah Cobb Other Clinician: Referring Provider: Treating Provider/Extender:Ethelwyn Gilbertson, Dorna Leitz, Orlie Pollen in Treatment: 113 Active Problems ICD-10 Evaluated Encounter Evaluated Encounter Code Description Active Date Today Diagnosis L89.892 Pressure ulcer of other site, stage 2 06/23/2019 No Yes L89.893 Pressure ulcer of other site, stage 3 06/23/2019 No Yes I73.89 Other specified peripheral vascular diseases 08/26/2018 No Yes L03.115 Cellulitis of right lower limb 12/25/2017 No  Yes Inactive Problems ICD-10 Code Description Active Date Inactive Date 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 07/14/2017 07/14/2017 breakdown of skin L03.116 Cellulitis of left lower limb 08/26/2018 08/26/2018 L89.312 Pressure ulcer of right buttock, stage 2 12/25/2017 12/25/2017 L89.152 Pressure ulcer of sacral region, stage 2 12/25/2017 12/25/2017 L89.322 Pressure ulcer of left buttock, stage 2 12/25/2017 12/25/2017 L03.116 Cellulitis of left lower limb 11/23/2018 11/23/2018 R51.884 Pressure ulcer of left ankle, stage 2 04/28/2019 04/28/2019 Resolved Problems ICD-10 Code Description Active Date Resolved Date L89.152 Pressure ulcer of sacral region, stage 2 03/31/2019 03/31/2019 L97.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 12/25/2017 12/25/2017 layer exposed L97.322 Non-pressure chronic ulcer of left 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 05/01/2018 05/01/2018 L97.521 Non-pressure chronic ulcer of other part of left foot limited to 11/10/2018 11/10/2018 breakdown of skin Electronic Signature(s) Signed: 09/14/2019 5:47:38 PM By: Baltazar Najjar MD Entered By: Baltazar Najjar on 09/14/2019 11:40:20 -------------------------------------------------------------------------------- Progress Note Details Patient Name: Date of Service: [name], [name] 09/14/2019 9:45 AM Medical Record [id number] Patient Account Number: [id number] Date of Birth/Sex: Treating RN: 13-Sep-1960 (58 y.o. [name] Primary Care Provider: Orpah Cobb Other Clinician: Referring Provider: Treating Provider/Extender:Lular Letson, Dorna Leitz, Orlie Pollen in Treatment: 113 Subjective History of Present Illness (HPI) 11/23/15; this is a patient I remember from a previous stay in this clinic in 2012. The patient says this was for a wound in this same area as currently although I have no information to verify that. Most of the information is provided by his caregiver from the  group home where he resides. Apparently he has had a wound in this area for 8 months. He also has erythema around this area and has had multiple rounds of antibiotics apparently with some improvement but the erythema is persists. He apparently had a fracture in the ankle 2 months ago and was seen by Dr. Lajoyce Corners . He apparently had a splint applied and more recently a heel offloading boot. He had home health coming out to a week ago not clear what they are dressing this with but they apparently discharged him perhaps because of one of Dr. Audrie Lia socks The patient has cerebral  [name], [name] ([id number]) Visit Report for 09/14/2019 HPI Details Patient Name: Date of Service: [name], [name] 09/14/2019 9:45 AM Medical Record [id number] Patient Account Number: [id number] Date of Birth/Sex: Treating RN: 01-19-1961 (58 y.o. [name]) [name] Primary Care Provider: Orpah Cobb Other Clinician: Referring Provider: Treating Provider/Extender:Alease Fait, Dorna Leitz, Orlie Pollen in Treatment: 113 History of Present Illness HPI Description: 11/23/15; this is a patient I remember from a previous stay in this clinic in 2012. The patient says this was for a wound in this same area as currently although I have no information to verify that. Most of the information is provided by his caregiver from the group home where he resides. Apparently he has had a wound in this area for 8 months. He also has erythema around this area and has had multiple rounds of antibiotics apparently with some improvement but the erythema is persists. He apparently had a fracture in the ankle 2 months ago and was seen by Dr. Lajoyce Corners . He apparently had a splint applied and more recently a heel offloading boot. He had home health coming out to a week ago not clear what they are dressing this with but they apparently discharged him perhaps because of one of Dr. 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 He has a Nurse, adult for transferring at home he is nonambulatory and does not wear footwear. He is not a diabetic. Looking through [facility] he appears to have fairly active primary care. The area on the ankle is variably labeled as a pressure area and/or chronic venous insufficiency. He had a recent venous ultrasound on 5/25 that did not show a DVT in the left leg however this was not a reflux study. There was no superficial DVT either. Arterial studies on 10/15/14 showed a left ABI of 1.16 Center right of 1.12 waveforms were triphasic he does not  have significant arterial disease. He has had recent x-rays of the left foot and ankle. The left foot did not show any abnormality. Left ankle x-ray showed a spiral fracture of the left tibial diaphysis without evidence of osteomyelitis. 11/30/15 culture I did last week showed pseudomonas I changed him to oral ciprofloxacin. Erythema is a lot better. 12/07/15 area around the wound shows considerable improvement of the periwound erythema. 12/14/15; the patient has a improvement of the periwound erythema which in retrospect I think with cellulitis successfully treated. We have been using Iodoflex started last week 12/25/15 wound on the left medial malleolus and dorsal left foot. Theraskin #1 applied READMISSION 02/27/16; the patient was admitted to hospital from 8/14 through 01/18/16 initially felt to have cellulitis of his left lower leg however he was noted to have significant persistent pain and swelling. A duplex ultrasound was ordered that was negative. Obie Dredge an x-ray was done that showed a spiral fracture of the left leg. With posterior displacement and angulation at the mid left tibia. Orthopedics saw the patient and splinted the leg. He was then sent to [facility] skilled facility and only return to his group home last week. He has wounds on the left medial malleolus, left dorsal foot and a worrisome area on the plantar left heel which is not really open but may represent a hematoma or a DTI 03/06/16; areas of left anterior and medial ankle both look improved. There are 3 small wounds here. He has a area over the dorsal left first metatarsal head which is not open. In meantime his left heel has opened  [name], [name] ([id number]) Visit Report for 09/14/2019 HPI Details Patient Name: Date of Service: [name], [name] 09/14/2019 9:45 AM Medical Record [id number] Patient Account Number: [id number] Date of Birth/Sex: Treating RN: 01-19-1961 (58 y.o. [name]) [name] Primary Care Provider: Orpah Cobb Other Clinician: Referring Provider: Treating Provider/Extender:Alease Fait, Dorna Leitz, Orlie Pollen in Treatment: 113 History of Present Illness HPI Description: 11/23/15; this is a patient I remember from a previous stay in this clinic in 2012. The patient says this was for a wound in this same area as currently although I have no information to verify that. Most of the information is provided by his caregiver from the group home where he resides. Apparently he has had a wound in this area for 8 months. He also has erythema around this area and has had multiple rounds of antibiotics apparently with some improvement but the erythema is persists. He apparently had a fracture in the ankle 2 months ago and was seen by Dr. Lajoyce Corners . He apparently had a splint applied and more recently a heel offloading boot. He had home health coming out to a week ago not clear what they are dressing this with but they apparently discharged him perhaps because of one of Dr. 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 He has a Nurse, adult for transferring at home he is nonambulatory and does not wear footwear. He is not a diabetic. Looking through [facility] he appears to have fairly active primary care. The area on the ankle is variably labeled as a pressure area and/or chronic venous insufficiency. He had a recent venous ultrasound on 5/25 that did not show a DVT in the left leg however this was not a reflux study. There was no superficial DVT either. Arterial studies on 10/15/14 showed a left ABI of 1.16 Center right of 1.12 waveforms were triphasic he does not  have significant arterial disease. He has had recent x-rays of the left foot and ankle. The left foot did not show any abnormality. Left ankle x-ray showed a spiral fracture of the left tibial diaphysis without evidence of osteomyelitis. 11/30/15 culture I did last week showed pseudomonas I changed him to oral ciprofloxacin. Erythema is a lot better. 12/07/15 area around the wound shows considerable improvement of the periwound erythema. 12/14/15; the patient has a improvement of the periwound erythema which in retrospect I think with cellulitis successfully treated. We have been using Iodoflex started last week 12/25/15 wound on the left medial malleolus and dorsal left foot. Theraskin #1 applied READMISSION 02/27/16; the patient was admitted to hospital from 8/14 through 01/18/16 initially felt to have cellulitis of his left lower leg however he was noted to have significant persistent pain and swelling. A duplex ultrasound was ordered that was negative. Obie Dredge an x-ray was done that showed a spiral fracture of the left leg. With posterior displacement and angulation at the mid left tibia. Orthopedics saw the patient and splinted the leg. He was then sent to [facility] skilled facility and only return to his group home last week. He has wounds on the left medial malleolus, left dorsal foot and a worrisome area on the plantar left heel which is not really open but may represent a hematoma or a DTI 03/06/16; areas of left anterior and medial ankle both look improved. There are 3 small wounds here. He has a area over the dorsal left first metatarsal head which is not open. In meantime his left heel has opened  of gelatinous debris. There is no palpable bone. We've been using gentamicin ointment and Hydrofera Blue. Since the patient was last here he was seen by Dr. Algis Liming of infectious disease. He ordered an MRI that I think  is due later this month on 4/30. He apparently also checked a sedimentation rate and C-reactive protein metabolic panel CBC with differential. At the time he was seen he wanted to withhold any microbials pending identification of the depth of the infection although looking at this I don't know that I'm going to be able to hold off for another 10 days. Electronic Signature(s) Signed: 09/14/2019 5:47:38 PM By: Baltazar Najjar MD Entered By: Baltazar Najjar on 09/14/2019 11:42:24 -------------------------------------------------------------------------------- Physical Exam Details Patient Name: Date of Service: [name], [name] 09/14/2019 9:45 AM Medical Record [id number] Patient Account Number: [id number] Date of Birth/Sex: Treating RN: December 30, 1960 (58 y.o. [name] Primary Care Provider: Orpah Cobb Other Clinician: Referring Provider: Treating Provider/Extender:Samiyyah Moffa, Dorna Leitz, Orlie Pollen in Treatment: 113 Constitutional Sitting or standing Blood Pressure is within target range for patient.. Pulse regular and within target range for patient.Marland Kitchen Respirations regular, non-labored and within target range.. Temperature is normal and within the target range for the patient.Marland Kitchen Appears in no distress. Notes Wound exam; right lateral foot. The wound is about the same size as when I saw this 2 weeks ago however not a very viable surface. Gelatinous material over the surface. Granulation is friable. Significant erythema around the wound which is exceptionally tender. The wound was washed off with wound cleanser and gauze no mechanical debridement was done. He also has areas on the tips of his right second toe and the dorsal part of his right fifth Electronic Signature(s) Signed: 09/14/2019 5:47:38 PM By: Baltazar Najjar MD Entered By: Baltazar Najjar on 09/14/2019 11:43:34 -------------------------------------------------------------------------------- Physician Orders  Details Patient Name: Date of Service: [name], [name] 09/14/2019 9:45 AM Medical Record [id number] Patient Account Number: [id number] Date of Birth/Sex: Treating RN: 1960-10-26 (58 y.o. [name] Primary Care Provider: Orpah Cobb Other Clinician: Referring Provider: Treating Provider/Extender:Eldonna Neuenfeldt, Dorna Leitz, Orlie Pollen in Treatment: 113 Verbal / Phone Orders: No Diagnosis Coding ICD-10 Coding Code Description L89.522 Pressure ulcer of left ankle, stage 2 L89.892 Pressure ulcer of other site, stage 2 L89.893 Pressure ulcer of other site, stage 3 I73.89 Other specified peripheral vascular diseases Follow-up Appointments Return Appointment in 2 weeks. Dressing Change Frequency Wound #37 Right,Lateral Foot Change dressing every day. Wound #38 Right Toe Fifth Change dressing every day. Wound Cleansing Wound #37 Right,Lateral Foot Clean wound with Wound Cleanser Wound #38 Right Toe Fifth Clean wound with Wound Cleanser Primary Wound Dressing Wound #37 Right,Lateral Foot Hydrofera Blue Wound #38 Right Toe Fifth Calcium Alginate with Silver Secondary Dressing Wound #37 Right,Lateral Foot Kerlix/Rolled Gauze Dry Gauze Wound #38 Right Toe Fifth Kerlix/Rolled Gauze Dry Gauze Edema Control Kerlix and Coban - Right Lower Extremity Off-Loading Multipodus Splint to: - sage boots or equivalent at all times Turn and reposition every 2 hours Home Health Continue Home Health skilled nursing for wound care. - [facility] Patient Medications Allergies: adhesive tape Notifications Medication Indication Start End cefdinir 09/14/2019 DOSE 1 - oral 300 mg capsule - 1 capsule oral, 2 times per day Electronic Signature(s) Signed: 09/14/2019 5:39:56 PM By: [name] Pax RN Signed: 09/14/2019 5:47:38 PM By: Baltazar Najjar MD Entered By: [name] on 09/14/2019  11:18:53 -------------------------------------------------------------------------------- Prescription 09/14/2019 Patient Name: [name] Provider: Baltazar Najjar MD Date of Birth: 03-19-1961 NPI#: [id number] Sex: M DEA#: [id number] Phone #:  [phone] License #: [id number] Patient Address: [facility] [address] [place], [place] [address] [address] [place], [place] [address] [phone] Allergies adhesive tape Reaction: rash Severity: Moderate Medication Medication: Route: Strength: Form: cefdinir oral 300 mg capsule Class: CEPHALOSPORIN ANTIBIOTICS - 3RD GENERATION Dose: Frequency / Time: Indication: 1 1 capsule oral, 2 times per day Number of Refills: Number of Units: 0 Generic Substitution: Start Date: End Date: Administered at Substitution Permitted 09/14/2019 Facility: No Note to Pharmacy: Signature(s): Date(s): Electronic Signature(s) Signed: 09/14/2019 5:39:56 PM By: [name] Pax RN Signed: 09/14/2019 5:47:38 PM By: Baltazar Najjar MD Entered By: [name] on 09/14/2019 11:18:55 --------------------------------------------------------------------------------  Problem List Details Patient Name: Date of Service: [name], [name] 09/14/2019 9:45 AM Medical Record [id number] Patient Account Number: [id number] Date of Birth/Sex: Treating RN: 09-Jul-1960 (58 y.o. [name] Primary Care Provider: Orpah Cobb Other Clinician: Referring Provider: Treating Provider/Extender:Ethelwyn Gilbertson, Dorna Leitz, Orlie Pollen in Treatment: 113 Active Problems ICD-10 Evaluated Encounter Evaluated Encounter Code Description Active Date Today Diagnosis L89.892 Pressure ulcer of other site, stage 2 06/23/2019 No Yes L89.893 Pressure ulcer of other site, stage 3 06/23/2019 No Yes I73.89 Other specified peripheral vascular diseases 08/26/2018 No Yes L03.115 Cellulitis of right lower limb 12/25/2017 No  Yes Inactive Problems ICD-10 Code Description Active Date Inactive Date 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 07/14/2017 07/14/2017 breakdown of skin L03.116 Cellulitis of left lower limb 08/26/2018 08/26/2018 L89.312 Pressure ulcer of right buttock, stage 2 12/25/2017 12/25/2017 L89.152 Pressure ulcer of sacral region, stage 2 12/25/2017 12/25/2017 L89.322 Pressure ulcer of left buttock, stage 2 12/25/2017 12/25/2017 L03.116 Cellulitis of left lower limb 11/23/2018 11/23/2018 R51.884 Pressure ulcer of left ankle, stage 2 04/28/2019 04/28/2019 Resolved Problems ICD-10 Code Description Active Date Resolved Date L89.152 Pressure ulcer of sacral region, stage 2 03/31/2019 03/31/2019 L97.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 12/25/2017 12/25/2017 layer exposed L97.322 Non-pressure chronic ulcer of left 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 05/01/2018 05/01/2018 L97.521 Non-pressure chronic ulcer of other part of left foot limited to 11/10/2018 11/10/2018 breakdown of skin Electronic Signature(s) Signed: 09/14/2019 5:47:38 PM By: Baltazar Najjar MD Entered By: Baltazar Najjar on 09/14/2019 11:40:20 -------------------------------------------------------------------------------- Progress Note Details Patient Name: Date of Service: [name], [name] 09/14/2019 9:45 AM Medical Record [id number] Patient Account Number: [id number] Date of Birth/Sex: Treating RN: 13-Sep-1960 (58 y.o. [name] Primary Care Provider: Orpah Cobb Other Clinician: Referring Provider: Treating Provider/Extender:Lular Letson, Dorna Leitz, Orlie Pollen in Treatment: 113 Subjective History of Present Illness (HPI) 11/23/15; this is a patient I remember from a previous stay in this clinic in 2012. The patient says this was for a wound in this same area as currently although I have no information to verify that. Most of the information is provided by his caregiver from the  group home where he resides. Apparently he has had a wound in this area for 8 months. He also has erythema around this area and has had multiple rounds of antibiotics apparently with some improvement but the erythema is persists. He apparently had a fracture in the ankle 2 months ago and was seen by Dr. Lajoyce Corners . He apparently had a splint applied and more recently a heel offloading boot. He had home health coming out to a week ago not clear what they are dressing this with but they apparently discharged him perhaps because of one of Dr. Audrie Lia socks The patient has cerebral  of Birth/Sex: Treating RN: 1961-03-27 (58 y.o. 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, stage 3 I73.89 Other specified peripheral vascular diseases L03.115 Cellulitis of right lower limb Facility Procedures CPT4 Code: [id number] Description: 99214 - WOUND CARE VISIT-LEV 4 EST PT Modifier: Quantity: 1 Physician Procedures CPT4 Code: 1308657 Description: 99214 - WC PHYS LEVEL 4 - EST PT ICD-10 Diagnosis Description L03.115 Cellulitis of right lower limb L89.893 Pressure ulcer of other site, stage 3 L89.892 Pressure ulcer of other site, stage 2 Modifier: Quantity: 1 Electronic Signature(s) Signed: 09/14/2019 5:47:38 PM By: Baltazar Najjar MD Entered By: Baltazar Najjar on 09/14/2019 11:45:53  [name], [name] ([id number]) Visit Report for 09/14/2019 HPI Details Patient Name: Date of Service: [name], [name] 09/14/2019 9:45 AM Medical Record [id number] Patient Account Number: [id number] Date of Birth/Sex: Treating RN: 01-19-1961 (58 y.o. [name]) [name] Primary Care Provider: Orpah Cobb Other Clinician: Referring Provider: Treating Provider/Extender:Alease Fait, Dorna Leitz, Orlie Pollen in Treatment: 113 History of Present Illness HPI Description: 11/23/15; this is a patient I remember from a previous stay in this clinic in 2012. The patient says this was for a wound in this same area as currently although I have no information to verify that. Most of the information is provided by his caregiver from the group home where he resides. Apparently he has had a wound in this area for 8 months. He also has erythema around this area and has had multiple rounds of antibiotics apparently with some improvement but the erythema is persists. He apparently had a fracture in the ankle 2 months ago and was seen by Dr. Lajoyce Corners . He apparently had a splint applied and more recently a heel offloading boot. He had home health coming out to a week ago not clear what they are dressing this with but they apparently discharged him perhaps because of one of Dr. 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 He has a Nurse, adult for transferring at home he is nonambulatory and does not wear footwear. He is not a diabetic. Looking through [facility] he appears to have fairly active primary care. The area on the ankle is variably labeled as a pressure area and/or chronic venous insufficiency. He had a recent venous ultrasound on 5/25 that did not show a DVT in the left leg however this was not a reflux study. There was no superficial DVT either. Arterial studies on 10/15/14 showed a left ABI of 1.16 Center right of 1.12 waveforms were triphasic he does not  have significant arterial disease. He has had recent x-rays of the left foot and ankle. The left foot did not show any abnormality. Left ankle x-ray showed a spiral fracture of the left tibial diaphysis without evidence of osteomyelitis. 11/30/15 culture I did last week showed pseudomonas I changed him to oral ciprofloxacin. Erythema is a lot better. 12/07/15 area around the wound shows considerable improvement of the periwound erythema. 12/14/15; the patient has a improvement of the periwound erythema which in retrospect I think with cellulitis successfully treated. We have been using Iodoflex started last week 12/25/15 wound on the left medial malleolus and dorsal left foot. Theraskin #1 applied READMISSION 02/27/16; the patient was admitted to hospital from 8/14 through 01/18/16 initially felt to have cellulitis of his left lower leg however he was noted to have significant persistent pain and swelling. A duplex ultrasound was ordered that was negative. Obie Dredge an x-ray was done that showed a spiral fracture of the left leg. With posterior displacement and angulation at the mid left tibia. Orthopedics saw the patient and splinted the leg. He was then sent to [facility] skilled facility and only return to his group home last week. He has wounds on the left medial malleolus, left dorsal foot and a worrisome area on the plantar left heel which is not really open but may represent a hematoma or a DTI 03/06/16; areas of left anterior and medial ankle both look improved. There are 3 small wounds here. He has a area over the dorsal left first metatarsal head which is not open. In meantime his left heel has opened  of gelatinous debris. There is no palpable bone. We've been using gentamicin ointment and Hydrofera Blue. Since the patient was last here he was seen by Dr. Algis Liming of infectious disease. He ordered an MRI that I think  is due later this month on 4/30. He apparently also checked a sedimentation rate and C-reactive protein metabolic panel CBC with differential. At the time he was seen he wanted to withhold any microbials pending identification of the depth of the infection although looking at this I don't know that I'm going to be able to hold off for another 10 days. Electronic Signature(s) Signed: 09/14/2019 5:47:38 PM By: Baltazar Najjar MD Entered By: Baltazar Najjar on 09/14/2019 11:42:24 -------------------------------------------------------------------------------- Physical Exam Details Patient Name: Date of Service: [name], [name] 09/14/2019 9:45 AM Medical Record [id number] Patient Account Number: [id number] Date of Birth/Sex: Treating RN: December 30, 1960 (58 y.o. [name] Primary Care Provider: Orpah Cobb Other Clinician: Referring Provider: Treating Provider/Extender:Samiyyah Moffa, Dorna Leitz, Orlie Pollen in Treatment: 113 Constitutional Sitting or standing Blood Pressure is within target range for patient.. Pulse regular and within target range for patient.Marland Kitchen Respirations regular, non-labored and within target range.. Temperature is normal and within the target range for the patient.Marland Kitchen Appears in no distress. Notes Wound exam; right lateral foot. The wound is about the same size as when I saw this 2 weeks ago however not a very viable surface. Gelatinous material over the surface. Granulation is friable. Significant erythema around the wound which is exceptionally tender. The wound was washed off with wound cleanser and gauze no mechanical debridement was done. He also has areas on the tips of his right second toe and the dorsal part of his right fifth Electronic Signature(s) Signed: 09/14/2019 5:47:38 PM By: Baltazar Najjar MD Entered By: Baltazar Najjar on 09/14/2019 11:43:34 -------------------------------------------------------------------------------- Physician Orders  Details Patient Name: Date of Service: [name], [name] 09/14/2019 9:45 AM Medical Record [id number] Patient Account Number: [id number] Date of Birth/Sex: Treating RN: 1960-10-26 (58 y.o. [name] Primary Care Provider: Orpah Cobb Other Clinician: Referring Provider: Treating Provider/Extender:Eldonna Neuenfeldt, Dorna Leitz, Orlie Pollen in Treatment: 113 Verbal / Phone Orders: No Diagnosis Coding ICD-10 Coding Code Description L89.522 Pressure ulcer of left ankle, stage 2 L89.892 Pressure ulcer of other site, stage 2 L89.893 Pressure ulcer of other site, stage 3 I73.89 Other specified peripheral vascular diseases Follow-up Appointments Return Appointment in 2 weeks. Dressing Change Frequency Wound #37 Right,Lateral Foot Change dressing every day. Wound #38 Right Toe Fifth Change dressing every day. Wound Cleansing Wound #37 Right,Lateral Foot Clean wound with Wound Cleanser Wound #38 Right Toe Fifth Clean wound with Wound Cleanser Primary Wound Dressing Wound #37 Right,Lateral Foot Hydrofera Blue Wound #38 Right Toe Fifth Calcium Alginate with Silver Secondary Dressing Wound #37 Right,Lateral Foot Kerlix/Rolled Gauze Dry Gauze Wound #38 Right Toe Fifth Kerlix/Rolled Gauze Dry Gauze Edema Control Kerlix and Coban - Right Lower Extremity Off-Loading Multipodus Splint to: - sage boots or equivalent at all times Turn and reposition every 2 hours Home Health Continue Home Health skilled nursing for wound care. - [facility] Patient Medications Allergies: adhesive tape Notifications Medication Indication Start End cefdinir 09/14/2019 DOSE 1 - oral 300 mg capsule - 1 capsule oral, 2 times per day Electronic Signature(s) Signed: 09/14/2019 5:39:56 PM By: [name] Pax RN Signed: 09/14/2019 5:47:38 PM By: Baltazar Najjar MD Entered By: [name] on 09/14/2019  11:18:53 -------------------------------------------------------------------------------- Prescription 09/14/2019 Patient Name: [name] Provider: Baltazar Najjar MD Date of Birth: 03-19-1961 NPI#: [id number] Sex: M DEA#: [id number] Phone #:  of gelatinous debris. There is no palpable bone. We've been using gentamicin ointment and Hydrofera Blue. Since the patient was last here he was seen by Dr. Algis Liming of infectious disease. He ordered an MRI that I think  is due later this month on 4/30. He apparently also checked a sedimentation rate and C-reactive protein metabolic panel CBC with differential. At the time he was seen he wanted to withhold any microbials pending identification of the depth of the infection although looking at this I don't know that I'm going to be able to hold off for another 10 days. Electronic Signature(s) Signed: 09/14/2019 5:47:38 PM By: Baltazar Najjar MD Entered By: Baltazar Najjar on 09/14/2019 11:42:24 -------------------------------------------------------------------------------- Physical Exam Details Patient Name: Date of Service: [name], [name] 09/14/2019 9:45 AM Medical Record [id number] Patient Account Number: [id number] Date of Birth/Sex: Treating RN: December 30, 1960 (58 y.o. [name] Primary Care Provider: Orpah Cobb Other Clinician: Referring Provider: Treating Provider/Extender:Samiyyah Moffa, Dorna Leitz, Orlie Pollen in Treatment: 113 Constitutional Sitting or standing Blood Pressure is within target range for patient.. Pulse regular and within target range for patient.Marland Kitchen Respirations regular, non-labored and within target range.. Temperature is normal and within the target range for the patient.Marland Kitchen Appears in no distress. Notes Wound exam; right lateral foot. The wound is about the same size as when I saw this 2 weeks ago however not a very viable surface. Gelatinous material over the surface. Granulation is friable. Significant erythema around the wound which is exceptionally tender. The wound was washed off with wound cleanser and gauze no mechanical debridement was done. He also has areas on the tips of his right second toe and the dorsal part of his right fifth Electronic Signature(s) Signed: 09/14/2019 5:47:38 PM By: Baltazar Najjar MD Entered By: Baltazar Najjar on 09/14/2019 11:43:34 -------------------------------------------------------------------------------- Physician Orders  Details Patient Name: Date of Service: [name], [name] 09/14/2019 9:45 AM Medical Record [id number] Patient Account Number: [id number] Date of Birth/Sex: Treating RN: 1960-10-26 (58 y.o. [name] Primary Care Provider: Orpah Cobb Other Clinician: Referring Provider: Treating Provider/Extender:Eldonna Neuenfeldt, Dorna Leitz, Orlie Pollen in Treatment: 113 Verbal / Phone Orders: No Diagnosis Coding ICD-10 Coding Code Description L89.522 Pressure ulcer of left ankle, stage 2 L89.892 Pressure ulcer of other site, stage 2 L89.893 Pressure ulcer of other site, stage 3 I73.89 Other specified peripheral vascular diseases Follow-up Appointments Return Appointment in 2 weeks. Dressing Change Frequency Wound #37 Right,Lateral Foot Change dressing every day. Wound #38 Right Toe Fifth Change dressing every day. Wound Cleansing Wound #37 Right,Lateral Foot Clean wound with Wound Cleanser Wound #38 Right Toe Fifth Clean wound with Wound Cleanser Primary Wound Dressing Wound #37 Right,Lateral Foot Hydrofera Blue Wound #38 Right Toe Fifth Calcium Alginate with Silver Secondary Dressing Wound #37 Right,Lateral Foot Kerlix/Rolled Gauze Dry Gauze Wound #38 Right Toe Fifth Kerlix/Rolled Gauze Dry Gauze Edema Control Kerlix and Coban - Right Lower Extremity Off-Loading Multipodus Splint to: - sage boots or equivalent at all times Turn and reposition every 2 hours Home Health Continue Home Health skilled nursing for wound care. - [facility] Patient Medications Allergies: adhesive tape Notifications Medication Indication Start End cefdinir 09/14/2019 DOSE 1 - oral 300 mg capsule - 1 capsule oral, 2 times per day Electronic Signature(s) Signed: 09/14/2019 5:39:56 PM By: [name] Pax RN Signed: 09/14/2019 5:47:38 PM By: Baltazar Najjar MD Entered By: [name] on 09/14/2019  11:18:53 -------------------------------------------------------------------------------- Prescription 09/14/2019 Patient Name: [name] Provider: Baltazar Najjar MD Date of Birth: 03-19-1961 NPI#: [id number] Sex: M DEA#: [id number] Phone #:  of Birth/Sex: Treating RN: 1961-03-27 (58 y.o. 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, stage 3 I73.89 Other specified peripheral vascular diseases L03.115 Cellulitis of right lower limb Facility Procedures CPT4 Code: [id number] Description: 99214 - WOUND CARE VISIT-LEV 4 EST PT Modifier: Quantity: 1 Physician Procedures CPT4 Code: [id number] Description: 99214 - WC PHYS LEVEL 4 - EST PT ICD-10 Diagnosis Description L03.115 Cellulitis of right lower limb L89.893 Pressure ulcer of other site, stage 3 L89.892 Pressure ulcer of other site, stage 2 Modifier: Quantity: 1 Electronic Signature(s) Signed: 09/14/2019 5:47:38 PM By: Baltazar Najjar MD Entered By: Baltazar Najjar on 09/14/2019 11:45:53  [phone] License #: [id number] Patient Address: [facility] [address] [place], [place] [address] [address] [place], [place] [address] [phone] Allergies adhesive tape Reaction: rash Severity: Moderate Medication Medication: Route: Strength: Form: cefdinir oral 300 mg capsule Class: CEPHALOSPORIN ANTIBIOTICS - 3RD GENERATION Dose: Frequency / Time: Indication: 1 1 capsule oral, 2 times per day Number of Refills: Number of Units: 0 Generic Substitution: Start Date: End Date: Administered at Substitution Permitted 09/14/2019 Facility: No Note to Pharmacy: Signature(s): Date(s): Electronic Signature(s) Signed: 09/14/2019 5:39:56 PM By: [name] Pax RN Signed: 09/14/2019 5:47:38 PM By: Baltazar Najjar MD Entered By: [name] on 09/14/2019 11:18:55 --------------------------------------------------------------------------------  Problem List Details Patient Name: Date of Service: [name], [name] 09/14/2019 9:45 AM Medical Record [id number] Patient Account Number: [id number] Date of Birth/Sex: Treating RN: 09-Jul-1960 (58 y.o. [name] Primary Care Provider: Orpah Cobb Other Clinician: Referring Provider: Treating Provider/Extender:Ethelwyn Gilbertson, Dorna Leitz, Orlie Pollen in Treatment: 113 Active Problems ICD-10 Evaluated Encounter Evaluated Encounter Code Description Active Date Today Diagnosis L89.892 Pressure ulcer of other site, stage 2 06/23/2019 No Yes L89.893 Pressure ulcer of other site, stage 3 06/23/2019 No Yes I73.89 Other specified peripheral vascular diseases 08/26/2018 No Yes L03.115 Cellulitis of right lower limb 12/25/2017 No  Yes Inactive Problems ICD-10 Code Description Active Date Inactive Date 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 07/14/2017 07/14/2017 breakdown of skin L03.116 Cellulitis of left lower limb 08/26/2018 08/26/2018 L89.312 Pressure ulcer of right buttock, stage 2 12/25/2017 12/25/2017 L89.152 Pressure ulcer of sacral region, stage 2 12/25/2017 12/25/2017 L89.322 Pressure ulcer of left buttock, stage 2 12/25/2017 12/25/2017 L03.116 Cellulitis of left lower limb 11/23/2018 11/23/2018 R51.884 Pressure ulcer of left ankle, stage 2 04/28/2019 04/28/2019 Resolved Problems ICD-10 Code Description Active Date Resolved Date L89.152 Pressure ulcer of sacral region, stage 2 03/31/2019 03/31/2019 L97.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 12/25/2017 12/25/2017 layer exposed L97.322 Non-pressure chronic ulcer of left 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 05/01/2018 05/01/2018 L97.521 Non-pressure chronic ulcer of other part of left foot limited to 11/10/2018 11/10/2018 breakdown of skin Electronic Signature(s) Signed: 09/14/2019 5:47:38 PM By: Baltazar Najjar MD Entered By: Baltazar Najjar on 09/14/2019 11:40:20 -------------------------------------------------------------------------------- Progress Note Details Patient Name: Date of Service: [name], [name] 09/14/2019 9:45 AM Medical Record [id number] Patient Account Number: [id number] Date of Birth/Sex: Treating RN: 13-Sep-1960 (58 y.o. [name] Primary Care Provider: Orpah Cobb Other Clinician: Referring Provider: Treating Provider/Extender:Lular Letson, Dorna Leitz, Orlie Pollen in Treatment: 113 Subjective History of Present Illness (HPI) 11/23/15; this is a patient I remember from a previous stay in this clinic in 2012. The patient says this was for a wound in this same area as currently although I have no information to verify that. Most of the information is provided by his caregiver from the  group home where he resides. Apparently he has had a wound in this area for 8 months. He also has erythema around this area and has had multiple rounds of antibiotics apparently with some improvement but the erythema is persists. He apparently had a fracture in the ankle 2 months ago and was seen by Dr. Lajoyce Corners . He apparently had a splint applied and more recently a heel offloading boot. He had home health coming out to a week ago not clear what they are dressing this with but they apparently discharged him perhaps because of one of Dr. Audrie Lia socks The patient has cerebral  [phone] License #: [id number] Patient Address: [facility] [address] [place], [place] [address] [address] [place], [place] [address] [phone] Allergies adhesive tape Reaction: rash Severity: Moderate Medication Medication: Route: Strength: Form: cefdinir oral 300 mg capsule Class: CEPHALOSPORIN ANTIBIOTICS - 3RD GENERATION Dose: Frequency / Time: Indication: 1 1 capsule oral, 2 times per day Number of Refills: Number of Units: 0 Generic Substitution: Start Date: End Date: Administered at Substitution Permitted 09/14/2019 Facility: No Note to Pharmacy: Signature(s): Date(s): Electronic Signature(s) Signed: 09/14/2019 5:39:56 PM By: [name] Pax RN Signed: 09/14/2019 5:47:38 PM By: Baltazar Najjar MD Entered By: [name] on 09/14/2019 11:18:55 --------------------------------------------------------------------------------  Problem List Details Patient Name: Date of Service: [name], [name] 09/14/2019 9:45 AM Medical Record [id number] Patient Account Number: [id number] Date of Birth/Sex: Treating RN: 09-Jul-1960 (58 y.o. [name] Primary Care Provider: Orpah Cobb Other Clinician: Referring Provider: Treating Provider/Extender:Ethelwyn Gilbertson, Dorna Leitz, Orlie Pollen in Treatment: 113 Active Problems ICD-10 Evaluated Encounter Evaluated Encounter Code Description Active Date Today Diagnosis L89.892 Pressure ulcer of other site, stage 2 06/23/2019 No Yes L89.893 Pressure ulcer of other site, stage 3 06/23/2019 No Yes I73.89 Other specified peripheral vascular diseases 08/26/2018 No Yes L03.115 Cellulitis of right lower limb 12/25/2017 No  Yes Inactive Problems ICD-10 Code Description Active Date Inactive Date 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 07/14/2017 07/14/2017 breakdown of skin L03.116 Cellulitis of left lower limb 08/26/2018 08/26/2018 L89.312 Pressure ulcer of right buttock, stage 2 12/25/2017 12/25/2017 L89.152 Pressure ulcer of sacral region, stage 2 12/25/2017 12/25/2017 L89.322 Pressure ulcer of left buttock, stage 2 12/25/2017 12/25/2017 L03.116 Cellulitis of left lower limb 11/23/2018 11/23/2018 R51.884 Pressure ulcer of left ankle, stage 2 04/28/2019 04/28/2019 Resolved Problems ICD-10 Code Description Active Date Resolved Date L89.152 Pressure ulcer of sacral region, stage 2 03/31/2019 03/31/2019 L97.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 12/25/2017 12/25/2017 layer exposed L97.322 Non-pressure chronic ulcer of left 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 05/01/2018 05/01/2018 L97.521 Non-pressure chronic ulcer of other part of left foot limited to 11/10/2018 11/10/2018 breakdown of skin Electronic Signature(s) Signed: 09/14/2019 5:47:38 PM By: Baltazar Najjar MD Entered By: Baltazar Najjar on 09/14/2019 11:40:20 -------------------------------------------------------------------------------- Progress Note Details Patient Name: Date of Service: [name], [name] 09/14/2019 9:45 AM Medical Record [id number] Patient Account Number: [id number] Date of Birth/Sex: Treating RN: 13-Sep-1960 (58 y.o. [name] Primary Care Provider: Orpah Cobb Other Clinician: Referring Provider: Treating Provider/Extender:Lular Letson, Dorna Leitz, Orlie Pollen in Treatment: 113 Subjective History of Present Illness (HPI) 11/23/15; this is a patient I remember from a previous stay in this clinic in 2012. The patient says this was for a wound in this same area as currently although I have no information to verify that. Most of the information is provided by his caregiver from the  group home where he resides. Apparently he has had a wound in this area for 8 months. He also has erythema around this area and has had multiple rounds of antibiotics apparently with some improvement but the erythema is persists. He apparently had a fracture in the ankle 2 months ago and was seen by Dr. Lajoyce Corners . He apparently had a splint applied and more recently a heel offloading boot. He had home health coming out to a week ago not clear what they are dressing this with but they apparently discharged him perhaps because of one of Dr. Audrie Lia socks The patient has cerebral  [phone] License #: [id number] Patient Address: [facility] [address] [place], [place] [address] [address] [place], [place] [address] [phone] Allergies adhesive tape Reaction: rash Severity: Moderate Medication Medication: Route: Strength: Form: cefdinir oral 300 mg capsule Class: CEPHALOSPORIN ANTIBIOTICS - 3RD GENERATION Dose: Frequency / Time: Indication: 1 1 capsule oral, 2 times per day Number of Refills: Number of Units: 0 Generic Substitution: Start Date: End Date: Administered at Substitution Permitted 09/14/2019 Facility: No Note to Pharmacy: Signature(s): Date(s): Electronic Signature(s) Signed: 09/14/2019 5:39:56 PM By: [name] Pax RN Signed: 09/14/2019 5:47:38 PM By: Baltazar Najjar MD Entered By: [name] on 09/14/2019 11:18:55 --------------------------------------------------------------------------------  Problem List Details Patient Name: Date of Service: [name], [name] 09/14/2019 9:45 AM Medical Record [id number] Patient Account Number: [id number] Date of Birth/Sex: Treating RN: 09-Jul-1960 (58 y.o. [name] Primary Care Provider: Orpah Cobb Other Clinician: Referring Provider: Treating Provider/Extender:Ethelwyn Gilbertson, Dorna Leitz, Orlie Pollen in Treatment: 113 Active Problems ICD-10 Evaluated Encounter Evaluated Encounter Code Description Active Date Today Diagnosis L89.892 Pressure ulcer of other site, stage 2 06/23/2019 No Yes L89.893 Pressure ulcer of other site, stage 3 06/23/2019 No Yes I73.89 Other specified peripheral vascular diseases 08/26/2018 No Yes L03.115 Cellulitis of right lower limb 12/25/2017 No  Yes Inactive Problems ICD-10 Code Description Active Date Inactive Date 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 07/14/2017 07/14/2017 breakdown of skin L03.116 Cellulitis of left lower limb 08/26/2018 08/26/2018 L89.312 Pressure ulcer of right buttock, stage 2 12/25/2017 12/25/2017 L89.152 Pressure ulcer of sacral region, stage 2 12/25/2017 12/25/2017 L89.322 Pressure ulcer of left buttock, stage 2 12/25/2017 12/25/2017 L03.116 Cellulitis of left lower limb 11/23/2018 11/23/2018 R51.884 Pressure ulcer of left ankle, stage 2 04/28/2019 04/28/2019 Resolved Problems ICD-10 Code Description Active Date Resolved Date L89.152 Pressure ulcer of sacral region, stage 2 03/31/2019 03/31/2019 L97.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 12/25/2017 12/25/2017 layer exposed L97.322 Non-pressure chronic ulcer of left 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 05/01/2018 05/01/2018 L97.521 Non-pressure chronic ulcer of other part of left foot limited to 11/10/2018 11/10/2018 breakdown of skin Electronic Signature(s) Signed: 09/14/2019 5:47:38 PM By: Baltazar Najjar MD Entered By: Baltazar Najjar on 09/14/2019 11:40:20 -------------------------------------------------------------------------------- Progress Note Details Patient Name: Date of Service: [name], [name] 09/14/2019 9:45 AM Medical Record [id number] Patient Account Number: [id number] Date of Birth/Sex: Treating RN: 13-Sep-1960 (58 y.o. [name] Primary Care Provider: Orpah Cobb Other Clinician: Referring Provider: Treating Provider/Extender:Lular Letson, Dorna Leitz, Orlie Pollen in Treatment: 113 Subjective History of Present Illness (HPI) 11/23/15; this is a patient I remember from a previous stay in this clinic in 2012. The patient says this was for a wound in this same area as currently although I have no information to verify that. Most of the information is provided by his caregiver from the  group home where he resides. Apparently he has had a wound in this area for 8 months. He also has erythema around this area and has had multiple rounds of antibiotics apparently with some improvement but the erythema is persists. He apparently had a fracture in the ankle 2 months ago and was seen by Dr. Lajoyce Corners . He apparently had a splint applied and more recently a heel offloading boot. He had home health coming out to a week ago not clear what they are dressing this with but they apparently discharged him perhaps because of one of Dr. Audrie Lia socks The patient has cerebral  [phone] License #: [id number] Patient Address: [facility] [address] [place], [place] [address] [address] [place], [place] [address] [phone] Allergies adhesive tape Reaction: rash Severity: Moderate Medication Medication: Route: Strength: Form: cefdinir oral 300 mg capsule Class: CEPHALOSPORIN ANTIBIOTICS - 3RD GENERATION Dose: Frequency / Time: Indication: 1 1 capsule oral, 2 times per day Number of Refills: Number of Units: 0 Generic Substitution: Start Date: End Date: Administered at Substitution Permitted 09/14/2019 Facility: No Note to Pharmacy: Signature(s): Date(s): Electronic Signature(s) Signed: 09/14/2019 5:39:56 PM By: [name] Pax RN Signed: 09/14/2019 5:47:38 PM By: Baltazar Najjar MD Entered By: [name] on 09/14/2019 11:18:55 --------------------------------------------------------------------------------  Problem List Details Patient Name: Date of Service: [name], [name] 09/14/2019 9:45 AM Medical Record [id number] Patient Account Number: [id number] Date of Birth/Sex: Treating RN: 09-Jul-1960 (58 y.o. [name] Primary Care Provider: Orpah Cobb Other Clinician: Referring Provider: Treating Provider/Extender:Ethelwyn Gilbertson, Dorna Leitz, Orlie Pollen in Treatment: 113 Active Problems ICD-10 Evaluated Encounter Evaluated Encounter Code Description Active Date Today Diagnosis L89.892 Pressure ulcer of other site, stage 2 06/23/2019 No Yes L89.893 Pressure ulcer of other site, stage 3 06/23/2019 No Yes I73.89 Other specified peripheral vascular diseases 08/26/2018 No Yes L03.115 Cellulitis of right lower limb 12/25/2017 No  Yes Inactive Problems ICD-10 Code Description Active Date Inactive Date 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 07/14/2017 07/14/2017 breakdown of skin L03.116 Cellulitis of left lower limb 08/26/2018 08/26/2018 L89.312 Pressure ulcer of right buttock, stage 2 12/25/2017 12/25/2017 L89.152 Pressure ulcer of sacral region, stage 2 12/25/2017 12/25/2017 L89.322 Pressure ulcer of left buttock, stage 2 12/25/2017 12/25/2017 L03.116 Cellulitis of left lower limb 11/23/2018 11/23/2018 R51.884 Pressure ulcer of left ankle, stage 2 04/28/2019 04/28/2019 Resolved Problems ICD-10 Code Description Active Date Resolved Date L89.152 Pressure ulcer of sacral region, stage 2 03/31/2019 03/31/2019 L97.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 12/25/2017 12/25/2017 layer exposed L97.322 Non-pressure chronic ulcer of left 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 05/01/2018 05/01/2018 L97.521 Non-pressure chronic ulcer of other part of left foot limited to 11/10/2018 11/10/2018 breakdown of skin Electronic Signature(s) Signed: 09/14/2019 5:47:38 PM By: Baltazar Najjar MD Entered By: Baltazar Najjar on 09/14/2019 11:40:20 -------------------------------------------------------------------------------- Progress Note Details Patient Name: Date of Service: [name], [name] 09/14/2019 9:45 AM Medical Record [id number] Patient Account Number: [id number] Date of Birth/Sex: Treating RN: 13-Sep-1960 (58 y.o. [name] Primary Care Provider: Orpah Cobb Other Clinician: Referring Provider: Treating Provider/Extender:Lular Letson, Dorna Leitz, Orlie Pollen in Treatment: 113 Subjective History of Present Illness (HPI) 11/23/15; this is a patient I remember from a previous stay in this clinic in 2012. The patient says this was for a wound in this same area as currently although I have no information to verify that. Most of the information is provided by his caregiver from the  group home where he resides. Apparently he has had a wound in this area for 8 months. He also has erythema around this area and has had multiple rounds of antibiotics apparently with some improvement but the erythema is persists. He apparently had a fracture in the ankle 2 months ago and was seen by Dr. Lajoyce Corners . He apparently had a splint applied and more recently a heel offloading boot. He had home health coming out to a week ago not clear what they are dressing this with but they apparently discharged him perhaps because of one of Dr. Audrie Lia socks The patient has cerebral  [name], [name] ([id number]) Visit Report for 09/14/2019 HPI Details Patient Name: Date of Service: [name], [name] 09/14/2019 9:45 AM Medical Record [id number] Patient Account Number: [id number] Date of Birth/Sex: Treating RN: 01-19-1961 (58 y.o. [name]) [name] Primary Care Provider: Orpah Cobb Other Clinician: Referring Provider: Treating Provider/Extender:Alease Fait, Dorna Leitz, Orlie Pollen in Treatment: 113 History of Present Illness HPI Description: 11/23/15; this is a patient I remember from a previous stay in this clinic in 2012. The patient says this was for a wound in this same area as currently although I have no information to verify that. Most of the information is provided by his caregiver from the group home where he resides. Apparently he has had a wound in this area for 8 months. He also has erythema around this area and has had multiple rounds of antibiotics apparently with some improvement but the erythema is persists. He apparently had a fracture in the ankle 2 months ago and was seen by Dr. Lajoyce Corners . He apparently had a splint applied and more recently a heel offloading boot. He had home health coming out to a week ago not clear what they are dressing this with but they apparently discharged him perhaps because of one of Dr. 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 He has a Nurse, adult for transferring at home he is nonambulatory and does not wear footwear. He is not a diabetic. Looking through [facility] he appears to have fairly active primary care. The area on the ankle is variably labeled as a pressure area and/or chronic venous insufficiency. He had a recent venous ultrasound on 5/25 that did not show a DVT in the left leg however this was not a reflux study. There was no superficial DVT either. Arterial studies on 10/15/14 showed a left ABI of 1.16 Center right of 1.12 waveforms were triphasic he does not  have significant arterial disease. He has had recent x-rays of the left foot and ankle. The left foot did not show any abnormality. Left ankle x-ray showed a spiral fracture of the left tibial diaphysis without evidence of osteomyelitis. 11/30/15 culture I did last week showed pseudomonas I changed him to oral ciprofloxacin. Erythema is a lot better. 12/07/15 area around the wound shows considerable improvement of the periwound erythema. 12/14/15; the patient has a improvement of the periwound erythema which in retrospect I think with cellulitis successfully treated. We have been using Iodoflex started last week 12/25/15 wound on the left medial malleolus and dorsal left foot. Theraskin #1 applied READMISSION 02/27/16; the patient was admitted to hospital from 8/14 through 01/18/16 initially felt to have cellulitis of his left lower leg however he was noted to have significant persistent pain and swelling. A duplex ultrasound was ordered that was negative. Obie Dredge an x-ray was done that showed a spiral fracture of the left leg. With posterior displacement and angulation at the mid left tibia. Orthopedics saw the patient and splinted the leg. He was then sent to [facility] skilled facility and only return to his group home last week. He has wounds on the left medial malleolus, left dorsal foot and a worrisome area on the plantar left heel which is not really open but may represent a hematoma or a DTI 03/06/16; areas of left anterior and medial ankle both look improved. There are 3 small wounds here. He has a area over the dorsal left first metatarsal head which is not open. In meantime his left heel has opened  [name], [name] ([id number]) Visit Report for 09/14/2019 HPI Details Patient Name: Date of Service: [name], [name] 09/14/2019 9:45 AM Medical Record [id number] Patient Account Number: [id number] Date of Birth/Sex: Treating RN: 01-19-1961 (58 y.o. [name]) [name] Primary Care Provider: Orpah Cobb Other Clinician: Referring Provider: Treating Provider/Extender:Alease Fait, Dorna Leitz, Orlie Pollen in Treatment: 113 History of Present Illness HPI Description: 11/23/15; this is a patient I remember from a previous stay in this clinic in 2012. The patient says this was for a wound in this same area as currently although I have no information to verify that. Most of the information is provided by his caregiver from the group home where he resides. Apparently he has had a wound in this area for 8 months. He also has erythema around this area and has had multiple rounds of antibiotics apparently with some improvement but the erythema is persists. He apparently had a fracture in the ankle 2 months ago and was seen by Dr. Lajoyce Corners . He apparently had a splint applied and more recently a heel offloading boot. He had home health coming out to a week ago not clear what they are dressing this with but they apparently discharged him perhaps because of one of Dr. 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 He has a Nurse, adult for transferring at home he is nonambulatory and does not wear footwear. He is not a diabetic. Looking through [facility] he appears to have fairly active primary care. The area on the ankle is variably labeled as a pressure area and/or chronic venous insufficiency. He had a recent venous ultrasound on 5/25 that did not show a DVT in the left leg however this was not a reflux study. There was no superficial DVT either. Arterial studies on 10/15/14 showed a left ABI of 1.16 Center right of 1.12 waveforms were triphasic he does not  have significant arterial disease. He has had recent x-rays of the left foot and ankle. The left foot did not show any abnormality. Left ankle x-ray showed a spiral fracture of the left tibial diaphysis without evidence of osteomyelitis. 11/30/15 culture I did last week showed pseudomonas I changed him to oral ciprofloxacin. Erythema is a lot better. 12/07/15 area around the wound shows considerable improvement of the periwound erythema. 12/14/15; the patient has a improvement of the periwound erythema which in retrospect I think with cellulitis successfully treated. We have been using Iodoflex started last week 12/25/15 wound on the left medial malleolus and dorsal left foot. Theraskin #1 applied READMISSION 02/27/16; the patient was admitted to hospital from 8/14 through 01/18/16 initially felt to have cellulitis of his left lower leg however he was noted to have significant persistent pain and swelling. A duplex ultrasound was ordered that was negative. Obie Dredge an x-ray was done that showed a spiral fracture of the left leg. With posterior displacement and angulation at the mid left tibia. Orthopedics saw the patient and splinted the leg. He was then sent to [facility] skilled facility and only return to his group home last week. He has wounds on the left medial malleolus, left dorsal foot and a worrisome area on the plantar left heel which is not really open but may represent a hematoma or a DTI 03/06/16; areas of left anterior and medial ankle both look improved. There are 3 small wounds here. He has a area over the dorsal left first metatarsal head which is not open. In meantime his left heel has opened  [phone] License #: [id number] Patient Address: [facility] [address] [place], [place] [address] [address] [place], [place] [address] [phone] Allergies adhesive tape Reaction: rash Severity: Moderate Medication Medication: Route: Strength: Form: cefdinir oral 300 mg capsule Class: CEPHALOSPORIN ANTIBIOTICS - 3RD GENERATION Dose: Frequency / Time: Indication: 1 1 capsule oral, 2 times per day Number of Refills: Number of Units: 0 Generic Substitution: Start Date: End Date: Administered at Substitution Permitted 09/14/2019 Facility: No Note to Pharmacy: Signature(s): Date(s): Electronic Signature(s) Signed: 09/14/2019 5:39:56 PM By: [name] Pax RN Signed: 09/14/2019 5:47:38 PM By: Baltazar Najjar MD Entered By: [name] on 09/14/2019 11:18:55 --------------------------------------------------------------------------------  Problem List Details Patient Name: Date of Service: [name], [name] 09/14/2019 9:45 AM Medical Record [id number] Patient Account Number: [id number] Date of Birth/Sex: Treating RN: 09-Jul-1960 (58 y.o. [name] Primary Care Provider: Orpah Cobb Other Clinician: Referring Provider: Treating Provider/Extender:Ethelwyn Gilbertson, Dorna Leitz, Orlie Pollen in Treatment: 113 Active Problems ICD-10 Evaluated Encounter Evaluated Encounter Code Description Active Date Today Diagnosis L89.892 Pressure ulcer of other site, stage 2 06/23/2019 No Yes L89.893 Pressure ulcer of other site, stage 3 06/23/2019 No Yes I73.89 Other specified peripheral vascular diseases 08/26/2018 No Yes L03.115 Cellulitis of right lower limb 12/25/2017 No  Yes Inactive Problems ICD-10 Code Description Active Date Inactive Date 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 07/14/2017 07/14/2017 breakdown of skin L03.116 Cellulitis of left lower limb 08/26/2018 08/26/2018 L89.312 Pressure ulcer of right buttock, stage 2 12/25/2017 12/25/2017 L89.152 Pressure ulcer of sacral region, stage 2 12/25/2017 12/25/2017 L89.322 Pressure ulcer of left buttock, stage 2 12/25/2017 12/25/2017 L03.116 Cellulitis of left lower limb 11/23/2018 11/23/2018 R51.884 Pressure ulcer of left ankle, stage 2 04/28/2019 04/28/2019 Resolved Problems ICD-10 Code Description Active Date Resolved Date L89.152 Pressure ulcer of sacral region, stage 2 03/31/2019 03/31/2019 L97.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 12/25/2017 12/25/2017 layer exposed L97.322 Non-pressure chronic ulcer of left 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 05/01/2018 05/01/2018 L97.521 Non-pressure chronic ulcer of other part of left foot limited to 11/10/2018 11/10/2018 breakdown of skin Electronic Signature(s) Signed: 09/14/2019 5:47:38 PM By: Baltazar Najjar MD Entered By: Baltazar Najjar on 09/14/2019 11:40:20 -------------------------------------------------------------------------------- Progress Note Details Patient Name: Date of Service: [name], [name] 09/14/2019 9:45 AM Medical Record [id number] Patient Account Number: [id number] Date of Birth/Sex: Treating RN: 13-Sep-1960 (58 y.o. [name] Primary Care Provider: Orpah Cobb Other Clinician: Referring Provider: Treating Provider/Extender:Lular Letson, Dorna Leitz, Orlie Pollen in Treatment: 113 Subjective History of Present Illness (HPI) 11/23/15; this is a patient I remember from a previous stay in this clinic in 2012. The patient says this was for a wound in this same area as currently although I have no information to verify that. Most of the information is provided by his caregiver from the  group home where he resides. Apparently he has had a wound in this area for 8 months. He also has erythema around this area and has had multiple rounds of antibiotics apparently with some improvement but the erythema is persists. He apparently had a fracture in the ankle 2 months ago and was seen by Dr. Lajoyce Corners . He apparently had a splint applied and more recently a heel offloading boot. He had home health coming out to a week ago not clear what they are dressing this with but they apparently discharged him perhaps because of one of Dr. Audrie Lia socks The patient has cerebral  [name], [name] ([id number]) Visit Report for 09/14/2019 HPI Details Patient Name: Date of Service: [name], [name] 09/14/2019 9:45 AM Medical Record [id number] Patient Account Number: [id number] Date of Birth/Sex: Treating RN: 01-19-1961 (58 y.o. [name]) [name] Primary Care Provider: Orpah Cobb Other Clinician: Referring Provider: Treating Provider/Extender:Alease Fait, Dorna Leitz, Orlie Pollen in Treatment: 113 History of Present Illness HPI Description: 11/23/15; this is a patient I remember from a previous stay in this clinic in 2012. The patient says this was for a wound in this same area as currently although I have no information to verify that. Most of the information is provided by his caregiver from the group home where he resides. Apparently he has had a wound in this area for 8 months. He also has erythema around this area and has had multiple rounds of antibiotics apparently with some improvement but the erythema is persists. He apparently had a fracture in the ankle 2 months ago and was seen by Dr. Lajoyce Corners . He apparently had a splint applied and more recently a heel offloading boot. He had home health coming out to a week ago not clear what they are dressing this with but they apparently discharged him perhaps because of one of Dr. 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 He has a Nurse, adult for transferring at home he is nonambulatory and does not wear footwear. He is not a diabetic. Looking through [facility] he appears to have fairly active primary care. The area on the ankle is variably labeled as a pressure area and/or chronic venous insufficiency. He had a recent venous ultrasound on 5/25 that did not show a DVT in the left leg however this was not a reflux study. There was no superficial DVT either. Arterial studies on 10/15/14 showed a left ABI of 1.16 Center right of 1.12 waveforms were triphasic he does not  have significant arterial disease. He has had recent x-rays of the left foot and ankle. The left foot did not show any abnormality. Left ankle x-ray showed a spiral fracture of the left tibial diaphysis without evidence of osteomyelitis. 11/30/15 culture I did last week showed pseudomonas I changed him to oral ciprofloxacin. Erythema is a lot better. 12/07/15 area around the wound shows considerable improvement of the periwound erythema. 12/14/15; the patient has a improvement of the periwound erythema which in retrospect I think with cellulitis successfully treated. We have been using Iodoflex started last week 12/25/15 wound on the left medial malleolus and dorsal left foot. Theraskin #1 applied READMISSION 02/27/16; the patient was admitted to hospital from 8/14 through 01/18/16 initially felt to have cellulitis of his left lower leg however he was noted to have significant persistent pain and swelling. A duplex ultrasound was ordered that was negative. Obie Dredge an x-ray was done that showed a spiral fracture of the left leg. With posterior displacement and angulation at the mid left tibia. Orthopedics saw the patient and splinted the leg. He was then sent to [facility] skilled facility and only return to his group home last week. He has wounds on the left medial malleolus, left dorsal foot and a worrisome area on the plantar left heel which is not really open but may represent a hematoma or a DTI 03/06/16; areas of left anterior and medial ankle both look improved. There are 3 small wounds here. He has a area over the dorsal left first metatarsal head which is not open. In meantime his left heel has opened  of Birth/Sex: Treating RN: 1961-03-27 (58 y.o. 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, stage 3 I73.89 Other specified peripheral vascular diseases L03.115 Cellulitis of right lower limb Facility Procedures CPT4 Code: [id number] Description: 99214 - WOUND CARE VISIT-LEV 4 EST PT Modifier: Quantity: 1 Physician Procedures CPT4 Code: [id number] Description: 99214 - WC PHYS LEVEL 4 - EST PT ICD-10 Diagnosis Description L03.115 Cellulitis of right lower limb L89.893 Pressure ulcer of other site, stage 3 L89.892 Pressure ulcer of other site, stage 2 Modifier: Quantity: 1 Electronic Signature(s) Signed: 09/14/2019 5:47:38 PM By: Baltazar Najjar MD Entered By: Baltazar Najjar on 09/14/2019 11:45:53  of gelatinous debris. There is no palpable bone. We've been using gentamicin ointment and Hydrofera Blue. Since the patient was last here he was seen by Dr. Algis Liming of infectious disease. He ordered an MRI that I think  is due later this month on 4/30. He apparently also checked a sedimentation rate and C-reactive protein metabolic panel CBC with differential. At the time he was seen he wanted to withhold any microbials pending identification of the depth of the infection although looking at this I don't know that I'm going to be able to hold off for another 10 days. Electronic Signature(s) Signed: 09/14/2019 5:47:38 PM By: Baltazar Najjar MD Entered By: Baltazar Najjar on 09/14/2019 11:42:24 -------------------------------------------------------------------------------- Physical Exam Details Patient Name: Date of Service: [name], [name] 09/14/2019 9:45 AM Medical Record [id number] Patient Account Number: [id number] Date of Birth/Sex: Treating RN: December 30, 1960 (58 y.o. [name] Primary Care Provider: Orpah Cobb Other Clinician: Referring Provider: Treating Provider/Extender:Samiyyah Moffa, Dorna Leitz, Orlie Pollen in Treatment: 113 Constitutional Sitting or standing Blood Pressure is within target range for patient.. Pulse regular and within target range for patient.Marland Kitchen Respirations regular, non-labored and within target range.. Temperature is normal and within the target range for the patient.Marland Kitchen Appears in no distress. Notes Wound exam; right lateral foot. The wound is about the same size as when I saw this 2 weeks ago however not a very viable surface. Gelatinous material over the surface. Granulation is friable. Significant erythema around the wound which is exceptionally tender. The wound was washed off with wound cleanser and gauze no mechanical debridement was done. He also has areas on the tips of his right second toe and the dorsal part of his right fifth Electronic Signature(s) Signed: 09/14/2019 5:47:38 PM By: Baltazar Najjar MD Entered By: Baltazar Najjar on 09/14/2019 11:43:34 -------------------------------------------------------------------------------- Physician Orders  Details Patient Name: Date of Service: [name], [name] 09/14/2019 9:45 AM Medical Record [id number] Patient Account Number: [id number] Date of Birth/Sex: Treating RN: 1960-10-26 (58 y.o. [name] Primary Care Provider: Orpah Cobb Other Clinician: Referring Provider: Treating Provider/Extender:Eldonna Neuenfeldt, Dorna Leitz, Orlie Pollen in Treatment: 113 Verbal / Phone Orders: No Diagnosis Coding ICD-10 Coding Code Description L89.522 Pressure ulcer of left ankle, stage 2 L89.892 Pressure ulcer of other site, stage 2 L89.893 Pressure ulcer of other site, stage 3 I73.89 Other specified peripheral vascular diseases Follow-up Appointments Return Appointment in 2 weeks. Dressing Change Frequency Wound #37 Right,Lateral Foot Change dressing every day. Wound #38 Right Toe Fifth Change dressing every day. Wound Cleansing Wound #37 Right,Lateral Foot Clean wound with Wound Cleanser Wound #38 Right Toe Fifth Clean wound with Wound Cleanser Primary Wound Dressing Wound #37 Right,Lateral Foot Hydrofera Blue Wound #38 Right Toe Fifth Calcium Alginate with Silver Secondary Dressing Wound #37 Right,Lateral Foot Kerlix/Rolled Gauze Dry Gauze Wound #38 Right Toe Fifth Kerlix/Rolled Gauze Dry Gauze Edema Control Kerlix and Coban - Right Lower Extremity Off-Loading Multipodus Splint to: - sage boots or equivalent at all times Turn and reposition every 2 hours Home Health Continue Home Health skilled nursing for wound care. - [facility] Patient Medications Allergies: adhesive tape Notifications Medication Indication Start End cefdinir 09/14/2019 DOSE 1 - oral 300 mg capsule - 1 capsule oral, 2 times per day Electronic Signature(s) Signed: 09/14/2019 5:39:56 PM By: [name] Pax RN Signed: 09/14/2019 5:47:38 PM By: Baltazar Najjar MD Entered By: [name] on 09/14/2019  11:18:53 -------------------------------------------------------------------------------- Prescription 09/14/2019 Patient Name: [name] Provider: Baltazar Najjar MD Date of Birth: 03-19-1961 NPI#: [id number] Sex: M DEA#: [id number] Phone #:

## 2019-09-20 ENCOUNTER — Other Ambulatory Visit: Payer: Self-pay

## 2019-09-20 ENCOUNTER — Ambulatory Visit (HOSPITAL_COMMUNITY)
Admission: RE | Admit: 2019-09-20 | Discharge: 2019-09-20 | Disposition: A | Discharge status: Home / Self Care | Payer: Medicare Other | Source: Ambulatory Visit | Attending: Infectious Disease | Admitting: Infectious Disease

## 2019-09-20 DIAGNOSIS — M86671 Other chronic osteomyelitis, right ankle and foot: Secondary | ICD-10-CM | POA: Diagnosis present

## 2019-09-20 MED ORDER — GADOBUTROL 1 MMOL/ML IV SOLN
8.0000 mL | Freq: Once | INTRAVENOUS | Status: AC | PRN
  Administered 2019-09-20: 8 mL via INTRAVENOUS

## 2019-09-23 ENCOUNTER — Ambulatory Visit (INDEPENDENT_AMBULATORY_CARE_PROVIDER_SITE_OTHER): Payer: Medicare Other | Admitting: Podiatry

## 2019-09-23 ENCOUNTER — Other Ambulatory Visit: Payer: Self-pay

## 2019-09-23 ENCOUNTER — Encounter (HOSPITAL_BASED_OUTPATIENT_CLINIC_OR_DEPARTMENT_OTHER): Payer: Medicare Other | Admitting: Internal Medicine

## 2019-09-23 DIAGNOSIS — L97514 Non-pressure chronic ulcer of other part of right foot with necrosis of bone: Secondary | ICD-10-CM

## 2019-09-23 DIAGNOSIS — G808 Other cerebral palsy: Secondary | ICD-10-CM | POA: Diagnosis not present

## 2019-09-23 DIAGNOSIS — L97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 layer exposed: Secondary | ICD-10-CM | POA: Diagnosis not present

## 2019-09-23 DIAGNOSIS — M86671 Other chronic osteomyelitis, right ankle and foot: Secondary | ICD-10-CM | POA: Diagnosis not present

## 2019-09-23 DIAGNOSIS — L89522 Pressure ulcer of left ankle, stage 2: Secondary | ICD-10-CM | POA: Diagnosis not present

## 2019-09-23 NOTE — Progress Notes (Signed)
[name], [name] ([id number]) Visit Report for 09/23/2019 HPI Details Patient Name: Date of Service: [name], [name] 09/23/2019 7:30 A M Medical Record Number: [id number] Patient Account Number: [id number] Date of Birth/Sex: Treating RN: Oct 01, 1960 (59 y.o. [name] Primary Care Provider: Orpah Cobb Other Clinician: Referring Provider: Treating Provider/Extender: Candiss Norse in Treatment: 114 History of Present Illness HPI Description: 11/23/15; this is a patient I remember from a previous stay in this clinic in 2012. The patient says this was for a wound in this same area as currently although I have no information to verify that. Most of the information is provided by his caregiver from the group home where he resides. Apparently he has had a wound in this area for 8 months. He also has erythema around this area and has had multiple rounds of antibiotics apparently with some improvement but the erythema is persists. He apparently had a fracture in the ankle 2 months ago and was seen by Dr. Lajoyce Corners . He apparently had a splint applied and more recently a heel offloading boot. He had home health coming out to a week ago not clear what they are dressing this with but they apparently discharged him perhaps because of one of Dr. 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 He has a Nurse, adult for transferring at home he is nonambulatory and does not wear footwear. He is not a diabetic. Looking through [place] [facility] he appears to have fairly active primary care. The area on the ankle is variably labeled as a pressure area and/or chronic venous insufficiency. He had a recent venous ultrasound on 5/25 that did not show a DVT in the left leg however this was not a reflux study. There was no superficial DVT either. Arterial studies on 10/15/14 showed a left ABI of 1.16 Center right of 1.12 waveforms were triphasic he does not have  significant arterial disease. He has had recent x-rays of the left foot and ankle. The left foot did not show any abnormality. Left ankle x-ray showed a spiral fracture of the left tibial diaphysis without evidence of osteomyelitis. 11/30/15 culture I did last week showed pseudomonas I changed him to oral ciprofloxacin. Erythema is a lot better. 12/07/15 area around the wound shows considerable improvement of the periwound erythema. 12/14/15; the patient has a improvement of the periwound erythema which in retrospect I think with cellulitis successfully treated. We have been using Iodoflex started last week 12/25/15 wound on the left medial malleolus and dorsal left foot. Theraskin #1 applied READMISSION 02/27/16; the patient was admitted to hospital from 8/14 through 01/18/16 initially felt to have cellulitis of his left lower leg however he was noted to have significant persistent pain and swelling. A duplex ultrasound was ordered that was negative. Obie Dredge an x-ray was done that showed a spiral fracture of the left leg. With posterior displacement and angulation at the mid left tibia. Orthopedics saw the patient and splinted the leg. He was then sent to [facility] skilled facility and only return to his group home last week. He has wounds on the left medial malleolus, left dorsal foot and a worrisome area on the plantar left heel which is not really open but may represent a hematoma or a DTI 03/06/16; areas of left anterior and medial ankle both look improved. There are 3 small wounds here. He has a area over the dorsal left first metatarsal head which is not open. In meantime his left  heel has opened 03/14/16 the medial left ankle wounds looks stable to improved. The area on the posterior left heel has opened up and had a very adherent eschar and slept over this 03/21/16 patient has 3 wounds on his left medial ankle is stable area on his left dorsal first metatarsal head appears to be improved.  Continued problems with adherent eschar over the left heel 03/28/16; patient has 3 wounds on his left medial ankle and lower leg all of these appear to be stable. The problem continues to be the left heel and the adherent eschar. This may have been a pressure ulcer from her brace at one point. He has a English as a second language teacher we have been using [employer] and all wound areas. Dressings are being changed by home health 04/04/16; the patient's areas on the medial aspect of the left ankle looks superficial and appear to be gradually closing. The area on the plantar aspects/Achilles aspect of his left heel as surface slough and nonviable subcutaneous tissue requiring debridement. 04/11/16; the areas on the patient's medial left ankle appear to be superficial and appear to be closing. The area on the plantar heel on the left has surface slough and nonviable subcutaneous tissue requiring debridement. I don't think this is too much different from last week there is also undermining 04/25/16; patient is followed in his group home by [facility] home health. The area on the plantar heel is smaller but with still with some depth. We're using Iodoflex to this. The areas on the left ankle looks healthy but dry and somewhat larger. Cause of this is not really clear using Prisma 05/02/16; areas x3 all look improved expecially the areas on the medial ankle. Heel wound is smaller but still deep 05/09/16; all areas 3 look improved on the medial ankle. Heel wound is also contracting. 12/0.9/17 the areas on his left medial ankle are all closed. I think these are largely chronic venous insufficiency however there is no edema. The area on his left heel was a pressure area. READMISSION 08/02/16; this man is a patient that we have had in this clinic on several times before. I believe he has cerebral palsy is nonambulatory. He has chronic venous insufficiency with venous inflammation. He was last year in the fall of 2017 with areas on his  left medial ankle left heel and left dorsal foot. He arrives today with once again a vague history. As mentioned he is nonambulatory he spends most of his day in a pressure offloading boot nevertheless they noted wounds in this area on the medial left ankle 2 weeks ago. There is any other history here I am uncertain 08/15/16; the patient's wounds on his medial ankle actually look better however the area on the left lateral/dorsal foot is covered in black necrotic eschar. Previous formal arterial studies did not show significant arterial disease with normal ABIs and triphasic waveforms. He is immobilized and at risk for pressure although he seems aware protective boots at all times as far as I am aware. 08/22/16; in general his wounds look better. All the wounds on his medial left ankle look improved the area on his lateral foot however continues to be problematic. Patient comes in the clinic today in a new wheelchair he is quite delighted 09/05/16; a wound on the left medial Calf and left dorsal foot are healed. The area on the left lateral foot is improved however the area on the left medial malleolus is inflamed with a considerable degree of surrounding erythema. This  looks like cellulitis. ABIs and clinical exam are within normal limits 09/12/16; the patient continues to have a wound on the left medial calf. I was concerned about cellulitis in this area last week and gave him a course of doxycycline. The wound looks better here. The area on the left lateral foot however is unchanged 09/20/16; the patient came in this week early after traumatizing his toe on the left foot on a table while moving around in his motorized wheelchair. 09/26/16- he is here for follow-up evaluation of his left foot and malleolus ulcers. Since his appointment he abraded his left medial hallux against a table while in his motorized wheelchair, apparently trying to pull up to the table for a meal. He continues to complain of  tenderness to the left lateral foot, surrounding the current ulcer 10/07/16; left foot and malleolus ulcers. 10/14/16; the patient is doing well. He has a wound on the left lateral, left medial malleolus and atraumatic wound on the left great toe. Although these appear to be superficial and closing 10/31/16; the patient is doing well. The area on the left lateral foot, left medial malleolus and the left great toe or all epithelialized. He has home health 11/14/16; patient arrives today with the area on the left great toe healed. The left lateral foot however in the left medial malleolus and the ankle superficial wounds that are slightly larger. He has erythema and tenderness around the area on the left lateral foot. As well he is complaining of dysuria. His caregiver notes that when he gets like this he is more lethargic which indeed seems to be the case today 11/21/16; left great toe is still healed. His wound on the left lateral foot appears healthy and slightly smaller also the area on the left medial ankle. This had black eschar on it which I removed. The remaining wound looks satisfactory 12/12/16; patient hasn't been here in a number of weeks. The area on the left lateral foot is healed however on his medial ankle has 2 open areas one of them covered with a necrotic eschar. He also has a new wound on the right second toe which was apparently trauma induced while he was at a movie 12/27/16; the patient has wounds on his left medial mallelus and left lateral foot that are just about closed. The area on the right second toe which was trauma from last time is still open but again everything looks a lot better here. 01/30/17; patient arrives in clinic today and is really been a long time since I've seen him. The wounds are all epithelialized on the left foot this includes his left lateral foot, left medial malleolus. Apparently was in the ER for what of I think was a fungal infection on his buttock's he was  prescribed Diflucan and doxycycline I think this is already getting better. READMISSION T is a now 59 year old man who has advanced cerebral palsy and functional quadriplegia. We have had him in this clinic multiple times before for wounds erry on his right ankle, left ankle, left dorsal foot and most recently from 08/02/16 through 01/30/17 with his left medial ankle and left foot and finally right second toe. His attendant from the group home where he lives is very knowledgeable about him. She states that they noticed breakdown in his skin in his feet sometime in January although they could not get an appointment until today in this clinic. The previous wounds have always been felt to be secondary to incidental trauma/pressure areas  or possibly some degree of chronic venous insufficiency. He had normal arterial studies in may of 2016. He has definite open wounds on the right lateral foot over the base of the fifth metatarsal, the right second toe DIP, the left medial malleolus. He has concerning areas over the left lateral fifth metatarsal base, retaform purpura over the medial left ankle and perhaps the medial left first toe. 07/21/17; culture we did last week from the base of the right fifth metatarsal grew group G strep. This should've been sensitive to the doxycycline I gave him. The other major wounds are on the right second toe DIP, left medial malleolus, base of the left lateral fifth metatarsal and the medial left ankle and medial left toe. 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 We have macrovascular arterial studies scheduled for Thursday although I don't think this is what the problem is. 07/28/17; patient arrives with really no improvement. He has a wound on the right second toe, lateral right foot, right medial malleolus which was new this week, left medial malleolus, but looked like a DTI on the left great toe. His macrovascular studies are on 07/30/17 although I would be  surprised if this provides all the explanation. He has small areas even on his right second and third toe which look like splinter-type hemorrhages. The question I have is where is this coming from. I'd like to see the vascular studies however before we pursue this. He has had a DVT in the past but is no longer on anticoagulants. I'll need to review his history 08/04/17; the patient had his ARTERIAL STUDIES this showed a right ABI of 1.16 and the left ABI of 1.20. TBI on the left at 0.84. He is at a right first toe amputation. The studies were unchanged from studies in May 2016. It was not felt that he had significant lower extremity arterial disease In the meantime we are using silver alginate to the wounds on the right second toe right lateral foot left medial malleolus. I'm going to change to Silver collagen today. 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 Working him up for this would include an echocardiogram probably a CT scan of the chest and abdomen to look at the major thoracic and abdominal aortic vessels. I am not going to involve myself in this unless there is more symptoms/signs 08/18/17; the patient arrives today with wounds looking somewhat better. The remaining areas are the right second toe dorsal, right lateral foot and left medial malleolus which only has a small open area. We've been using silver collagen 09/01/17;right second toe dorsally probes to bone today. This is a deterioration. We've been using silver collagen. Only a small open area remains on the left medial malleolus. 09/09/17; right second toe appears better. There is no probing bone. He has a small area remaining on the left medial malleolus, the right lateral foot. We have been using silver collagen 10/07/17; the patient returns to clinic today after a month hiatus. He has wounds over the right  second toe, right lateral foot and the left medial malleolus. We have been using silver collagen. He has home health. 10/24/17; patient has open wounds over the right second toe, now the left fourth toe and a superficial area over his left buttock. Some of the other areas have healed including the left medial malleolus and right lateral foot. Using silver collagen on these  wounds 11/07/17; the patient has closed his wounds on the right second toe and left fourth toe although they look all normal. Last time he was here he had a superficial area over the left buttock this is also closed. The right lateral foot wound I had closed out last time although it is seems to have reopened today. 628/19 the areas over the right second and left fourth toe remains closed. The right lateral foot has tightly adherent necrotic surface over. We have been using silver collagen he offloads this and a bunny boot 12/12/17; the patient has reopened the recurrent area over the right second PIP joint. 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 He does not have obvious macrovascular disease. The area over the right lateral foot looks about the same as last time tightly adherent necrotic debris. We've been using Hydrofera Blue 12/25/17; 2 week follow-up. The area over the right second PIP is closed however the area on the right lateral foot is worse with surrounding erythema and pain compatible with cellulitis is going to need an antibiotic. AS WELL..... We have looked at his buttocks and lower back. He has 3 stage II ulcers. The exact history here is unclear however he is very disabled man with cerebral palsy. He goes to a day program from the group home in which she lives 5 days a week and I think he is on his buttock all day on these visits. Surrounding the wounds see has extensive stage I injury as well. FINALLY it looks as though he has lost weight. I'm not the only  staff member that is recognized this 01/08/18 on evaluation today patient appears to be doing better in regard to his gluteal wounds. In fact everything appears to be healed back here although very dry. He has a couple open sores on the scrotal region other than this it is really his foot that is still giving him the most trouble. He did complete the Keflex unfortunately it seems like there's still a lot of erythema in this in fact looks worse than it did previous. I'm gonna culture this today and likely start with a different antibiotic. 01/16/18; I was reassured that the wounds on his buttocks and sacrum are healed. I did not look at these the day. The culture that was done last week on 01/11/18 of the right lateral foot wounds showed Pseudomonas. Bactrim is not a good choice for this. Ciprofloxacin interacts with his muscle relaxants therefore I prescribed cefdinir 300 twice a day for 7 days. Bactrim coverage of pseudomonas is not reliable although the degree of erythema looks a lot better 01/30/18; the patient has completed his antibiotics. The area does not have infection on the right lateral foot. The wounds look a lot better. We've been using silver alginate 02/12/18; there is on the right lateral foot. Copious amounts of surface eschar. Also similarly over the PIP of the right second toe. We've been using silver alginate to date 03/02/2018; the area that remains is on the right lateral foot. The right second toe is healed. 03/16/2018; the area there is original wound on the right lateral foot. Still requiring debridement. We are using Hydrofera Blue. The right second toe is still healed/closed 03/30/2018; right lateral foot there is 2 small open areas remaining. They actually look quite healthy we have been using Hydrofera Blue the right second toe remains closed 04/13/2018; right lateral foot. 1 of the 2 small open  areas from last time has closed over. The other had surface slough that I removed  with a #3 curette. This looks quite healthy. The right second toe remains closed. We are using Hydrofera Blue 05/01/2018; right lateral foot very superficial areas that look like they are trying to close over. He had a new threatened area on the left medial ankle just below the medial malleolus. I think these represent chronic venous insufficiency areas 05/14/2018; the right right lateral foot looks like it closed over as was predicted last time however he has some dark areas subcutaneously erythema superiorly and this is really very tender. This is either coexistent cellulitis or perhaps a deep tissue injury. The area on the left medial ankle is superficial but has some size. We have been using Hydrofera Blue to both areas. The patient complains of pain in his right foot 06/09/2018; right lateral foot was closed over last time but there was coexistent cellulitis around this. I gave him Levaquin. He also has an area on the left medial malleolus. He has not been back to clinic in over 3-1/2 weeks. We are using Hydrofera Blue I think to the left medial malleolus and silver alginate to the right lateral foot 1/21; right lateral foot still non-viable debris over this area and the left medial malleolus. Both of these vigorously washed off with antiseptic gauze. We have been using silver alginate. 2/4; right lateral foot this looks somewhat better. Unfortunately the area over the left medial malleolus has deteriorated. Using silver alginate in both area 2/18; both wounds arrived today covered in a lot of necrotic debris. We have been using silver alginate. Clearly not making a lot of progress. 3/3; the patient's right second toe is reopened over the PIP. I am not sure why this is happening. He may have microvascular disease. We have checked his macrovascular flow in the past and found it to be normal. He may need an x-ray. The area on the right lateral foot requires debridement. The area on the left lateral  ankle is worse. We have been using PolyMem Ag 08/26/18 on evaluation today patient appears to be doing a little worse in regard to his lower extremities based on what I'm seeing what the nursing staff is telling me. His left medial ankle appears potential to be infected there is your thing on want surrounding the wound. His right second toe is also of concern at this point in my pinion. We have not done x-rays of these areas that may be a good idea to go ahead and proceed with at this point to ensure will not deal with anything such as osteomyelitis. 4/16; it is been a while since I saw this patient. He now has wounds on the right lateral foot, dorsal right second toe, substantially on the left medial malleolus and a small area on the left lateral foot. We have been using PolyMem Silver. Curlex and Coban. He has home health changing the dressing. Since last time he was here he had a culture of the left medial ankle that showed MRSA. He is completed a course/7 days of doxycycline. XRAYS of the left ankle and right foot that were ordered were negative for osteomyelitis 4/30; he arrives in clinic today with some maceration around the wound and a lot of tenderness and erythema in a circumferential area around the right lateral foot. He has wound areas on the right lateral foot dorsal right second toe, largely over the left medial malleolus and a small area over  the left lateral foot 5/7; completed his antibiotics from last time right lateral foot cellulitis is better and the area on his left knee which we discovered after he had been seen by the provider also is improved. He still has extensive wound areas over the right lateral foot, left medial malleolus, right second toe and left lateral foot although all of these look somewhat better with PolyMem 5/15. Wounds on the right lateral foot, right second toe left medial malleolus and the left lateral foot. All of these look somewhat better. We have been  using PolyMem Ag 5/26; the wound on the right lateral foot is almost closed over but he still complains of a lot of pain in this area. He has a eschared area on the right second toe but no open wound.. Also concerning today his original wound on the left medial malleolus looks healthy however just inferior to this almost areas of deep tissue injury and there is an area on the left lateral foot also looks like a deep tissue injury. In the past I have wondered whether he might have cholesterol emboli or some other form of a vasculopathy. I wonder whether this could have something to do with this. 6/2; right lateral foot wound is still open I gave him empiric antibiotics last week without really a lot of change she is still complaining of pain in this area. We x- rayed his feet at some point in April these were negative. It would be difficult to do an MRI. The area on his left medial malleolus looks better. Right second toe is healed. He still has what looks to be a small pressure related area on the left lateral foot but it is not open. We are using PolyMem to all wound which seems to be doing nicely 6/16; right lateral foot wound is still open. The wound looks better although there is still a lot of tenderness around it. Rather than more empiric antibiotics I have cultured this today. We are using PolyMem Ag. On the left lateral malleolus the wound appears better. He has a new area on the left lower anterior pretibial area 6/29; 2-week follow-up. Culture I did of the right lateral foot 2 weeks ago showed MRSA. I gave him 2 weeks of Septra which he should be just about finishing now and this seems better. We got a call from home health last week to report swelling in the left leg because we were listed as his primary. We referred him back to primary care. He arrives today with intense erythema and tenderness around the 2 wounds on the left medial ankle on the left lower calf. The area on the right  lateral foot looks better 7/7; I brought him back this week to follow-up on his left foot which had cellulitis last week. I gave him clindamycin which as far as I know he tolerated. The erythema is better. He has a 3 current wounds one on the right lateral foot a small area on the right lateral malleolus and the more recent area on the left anterior pretibial area. 7/21; bilateral distal lower leg and foot wounds. The area on his right lateral foot looks a lot better. He has an area on his left pretibial area distally. The area on the medial malleolus on the left which is 1 of his worst wounds has closed over. He does not appear to have any open areas in his remaining toes 8/4-Patient returns at 2 weeks, he has the remaining wound on  the left lower leg the other 2 wounds have healed. Patient is continued to be treated with silver alginate with a 2 layer wrap on the left 8/18; I been following this patient for a long period for bilateral lower extremity wounds. 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 His wounds are probably pressure although he religiously wears thick bunny boots. He does not have macrovascular disease but I did formal testing on him perhaps 2 or 3 years ago. I have often wondered about either cholesterol emboli or a vasculopathy of some sort given the distal nature of these variable locations etc. He has 2 areas that we have been following one on the left medial lower leg and one on the right lateral foot. Both of these are mostly healed today there is some eschar over sections of both areas although I did not disturb this. 03/31/2019 on evaluation today patient appears to be doing a little bit more poorly in regard to the sacral region. He has 2 small wounds noted at this point. Fortunately there is no signs of active infection at this time. No fevers, chills, nausea, vomiting, or diarrhea. He is having some pain when he sits on  his gluteal region a big part of the reason he has these wounds is likely due to the fact that his wheelchair has not been functioning properly. He does still have a couple areas of deep tissue injury on his lower extremities he does need new Prevalon offloading boots as well. 04/28/2019 on evaluation today patient appears to be doing well with regard to his wounds in particular. His ankle area has fairly small based on what I am seeing currently and his sacral region actually is still open but does not appear to be doing too poorly in my opinion. Fortunately there is no signs of active infection at this time. He was in the hospital unfortunately and this was from 04/08/2019 through 04/12/2019 secondary to persistent diarrhea, dehydration and acute kidney injury. With that being said fortunately the patient does not seem to be doing too poorly at this time which is good news. He is having pain mainly in the sacral region. 06/23/2019 on evaluation today patient appears to be doing well with regard to his sacral region which in fact is completely healed. Unfortunately he has an area of rubbing/pressure on his right lateral chest/back region. This is where it is rubbing up on his chair. Subsequently he also has an open wound on the left medial malleolus which is not very open but starting to crack and then on the right lateral foot this is much more open at this point. Unfortunately there is no signs of significant improvement in regard to these areas and in fact there does appear to be evidence of active infection quite significantly. No fevers, chills, nausea, vomiting, or diarrhea. The patient did test positive for COVID-19 that has been greater than 14 days ago although they did not know the exact timing. That is why he was not here for his last appointment. 07/07/2019 upon evaluation today patient actually appears to be doing quite well in regard to his left ankle as well as the chest/back region which  is doing great in fact appears to be healed in my opinion. He still has an issue on his lateral right foot that is open and appears to be infected but does have me more concerned. In fact I do believe the doxycycline helped in general for the  cellulitis but I believe that may have been more MRSA related based on the culture that we did. What I am seeing right now has more of the blue-green drainage and may be more consistent with a Pseudomonas that I was hoping was not really causing much of an issue but I think it may be at this point on the right lateral foot. For that reason I do think treatment would be a good idea of the reason I avoided the Cipro before was the potential for QT prolongation which could obviously cause problems with the patient with results of interaction with respiratory wound. The tizanidine also had some interactions as well. Nonetheless I think at this point that it would be a possibility for [place] to use topical gentamicin to see if this could be of benefit for the patient underneath the silver alginate dressing. 07/21/2019 upon evaluation today patient appears to be doing fairly well at this point in regard to his lower extremity on the right. He does have a wound on his right lateral foot as well as the right fifth toe and the right fourth toe. Unfortunately he continues to experience significant issues with pressure and trauma to some degree and I think this is just due to the fact that again he is not really not able to control his legs and what is happening here. Fortunately there is no signs of active infection at this time. 08/04/2019 upon evaluation today patient actually appears to be doing quite well with regard to his toe ulcers I am very pleased in that regard. Unfortunately the foot ulcer is showing some signs of erythema his primary care provider has placed him on clindamycin at this point. Again I do believe this can be beneficial for him. With that being said I do  think the patient may benefit from a light Kerlix and Coban wrap to help with some of his edema at this point. 08/18/2019 upon evaluation today patient appears to be doing still poorly in regard to his lateral foot ulcer. This is not very deep but there is some concern about the possibility of osteomyelitis. I think it would be appropriate for [place] to go ahead and have an x-ray at this time. The patient is currently on clindamycin which from his culture which was performed on 06/23/2019 it does appear that he would benefit from that in regard to the MRSA. However Pseudomonas was also identified then and the patient actually has some blue-green drainage at this point as well. I am much more concerned about the possibility of the Pseudomonas being an infection is causative agent at this point that I am the MRSA. Subsequently Cipro the patient was sensitive to but we never use that is stable use a topical gent due to the fact that unfortunately there was some interaction between the Cipro and the patient's risk for down potentially causing QT prolongation. 08/31/2019; have not seen this patient in quite a period of time. He has a substantial wound on his right lateral foot we have been using gentamicin ointment with Hydrofera Blue over the top. From what I am able to determine he is also on antibiotics perhaps clindamycin although I have not had a chance to look over this. He also has an eschared area on the right fifth toe as well as the right second toe. 4/20; right lateral foot wound. He comes in today with intense erythema and tenderness around this wound. The wound itself has a surface on it but it doesn't  look particularly healthy a lot of gelatinous debris. There is no palpable bone. We've been using gentamicin ointment and Hydrofera Blue. Since the patient was last here he was seen by Dr. Algis Liming of infectious disease. He ordered an MRI that I think is due later this month on 4/30. He apparently also  checked a sedimentation rate and C-reactive protein metabolic panel CBC with differential. At the time he was seen he wanted to withhold any microbials pending identification of the depth of the infection although looking at this I don't know that I'm going to be able to hold off for another 10 days. 4/29; he went on to have the MRI that was ordered by Dr. Algis Liming of infectious disease. 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 I had given him cefdinir last week for surrounding cellulitis he is finishing this today. I have communicated with Dr. Algis Liming who wants bone for pathology and culture before proceeding with consideration of IV antibiotics. Fortunately he is due to see Dr. Samuella Cota of podiatry today I am hopeful that the bone biopsy and culture will be considered there Electronic Signature(s) Signed: 09/23/2019 5:41:26 PM By: Baltazar Najjar MD Entered By: Baltazar Najjar on 09/23/2019 08:27:00 -------------------------------------------------------------------------------- Physical Exam Details Patient Name: Date of Service: HUMBLEHaidyn, [place] 09/23/2019 7:30 A M Medical Record Number: [id number] Patient Account Number: [id number] Date of Birth/Sex: Treating RN: [birth year] (59 y.o. [name] Primary Care Provider: Orpah Cobb Other Clinician: Referring Provider: Treating Provider/Extender: Candiss Norse in Treatment: 114 Constitutional Patient is hypertensive.. Pulse regular and within target range for patient.Marland Kitchen Respirations regular, non-labored and within target range.. Temperature is normal and within the target range for the patient.Marland Kitchen Appears in no distress. Respiratory work of breathing is normal. Cardiovascular Pedal pulses are reduced at the dorsalis pedis but still faintly palpable. Notes Wound exam; right lateral foot this looks a lot better than when I saw him 9 days ago. There is less erythema more  viable looking surface still some tenderness however. The wound itself does not have exposed bone major area is over the base of the fifth metatarsal and just proximal to this. I am uncertain about whether it is going to be easy to go through the wound to get bone or not I will let Dr. Samuella Cota look at this. Might have to go more proximally than that Electronic Signature(s) Signed: 09/23/2019 5:41:26 PM By: Baltazar Najjar MD Entered By: Baltazar Najjar on 09/23/2019 71:69:67 -------------------------------------------------------------------------------- Physician Orders Details Patient Name: Date of Service: [name] 09/23/2019 7:30 A M Medical Record Number: [id number] Patient Account Number: [id number] Date of Birth/Sex: Treating RN: [birth year] (59 y.o. [name] Primary Care Provider: Orpah Cobb Other Clinician: Referring Provider: Treating Provider/Extender: Candiss Norse in Treatment: [id number] Verbal / Phone Orders: No Diagnosis Coding ICD-10 Coding Code Description [phone] Pressure ulcer of other site, stage 2 L03.115 Cellulitis of right lower limb L89.893 Pressure ulcer of other site, stage 3 I73.89 Other specified peripheral vascular diseases Follow-up Appointments ppointment in 1 week. - Tuesday with Dr. Leanord Hawking. Return A ***Facility to schedule patient to see Dr. Samuella Cota regarding bone biopsy. *** Dressing Change Frequency Wound #37 Right,Lateral Foot Change dressing every day. Wound #38 Right T Fifth oe Change dressing every day. Wound Cleansing Wound #37 Right,Lateral Foot Clean wound with Wound Cleanser Wound #38 Right T Fifth oe Clean wound with Wound Cleanser Primary Wound Dressing Wound #37 Right,Lateral Foot Calcium Alginate with Silver Wound #  38 Right T Fifth oe Calcium Alginate with Silver Secondary Dressing Wound #37 Right,Lateral Foot Kerlix/Rolled Gauze Dry Gauze Wound #38 Right T Fifth oe Kerlix/Rolled  Gauze Dry Gauze Edema Control Kerlix and Coban - Right Lower Extremity Off-Loading Multipodus Splint to: - sage boots or equivalent at all times Turn and reposition every 2 hours Additional Orders / Instructions Other: - discontinue Gentamycin ointment. discontinue oral Cefdinir antibiotic. Home Health Continue Home Health skilled nursing for wound care. Beverly Gust Electronic Signature(s) Signed: 09/23/2019 5:33:37 PM By: Shawn Stall Signed: 09/23/2019 5:41:26 PM By: Baltazar Najjar MD Entered By: Shawn Stall on 09/23/2019 08:13:47 -------------------------------------------------------------------------------- Problem List Details Patient Name: Date of Service: HIRAN, LEARD 09/23/2019 7:30 A M Medical Record Number: [id number] Patient Account Number: [id number] Date of Birth/Sex: Treating RN: 12/01/1960 (59 y.o. [name] Primary Care Provider: Orpah Cobb Other Clinician: Referring Provider: Treating Provider/Extender: Candiss Norse in Treatment: 114 Active Problems ICD-10 Encounter Code Description Active Date MDM Diagnosis L89.892 Pressure ulcer of other site, stage 2 06/23/2019 No Yes L03.115 Cellulitis of right lower limb 12/25/2017 No Yes L89.893 Pressure ulcer of other site, stage 3 06/23/2019 No Yes I73.89 Other specified peripheral vascular diseases 08/26/2018 No Yes Inactive Problems ICD-10 Code Description Active Date Inactive Date 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 breakdown of skin 07/14/2017 07/14/2017 L89.522 Pressure ulcer of left ankle, stage 2 04/28/2019 04/28/2019 L03.116 Cellulitis of left lower limb 08/26/2018 08/26/2018 L89.312 Pressure ulcer of right buttock, stage 2 12/25/2017 12/25/2017 L89.152 Pressure ulcer of sacral region, stage 2 12/25/2017 12/25/2017 L89.322 Pressure ulcer of left buttock, stage 2 12/25/2017 12/25/2017 L03.116 Cellulitis of left lower limb 11/23/2018  11/23/2018 Resolved Problems ICD-10 Code Description Active Date Resolved Date L89.152 Pressure ulcer of sacral region, stage 2 03/31/2019 03/31/2019 L97.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 layer exposed 12/25/2017 12/25/2017 L97.322 Non-pressure chronic ulcer of left 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 05/01/2018 05/01/2018 L97.521 Non-pressure chronic ulcer of other part of left foot limited to breakdown of skin 11/10/2018 11/10/2018 Electronic Signature(s) Signed: 09/23/2019 5:41:26 PM By: Baltazar Najjar MD Entered By: Baltazar Najjar on 09/23/2019 08:25:05 -------------------------------------------------------------------------------- Progress Note Details Patient Name: Date of Service: [name] 09/23/2019 7:30 A M Medical Record Number: [id number] Patient Account Number: [id number] Date of Birth/Sex: Treating RN: December 25, 1960 (59 y.o. [name] Primary Care Provider: Orpah Cobb Other Clinician: Referring Provider: Treating Provider/Extender: Candiss Norse in Treatment: 114 Subjective History of Present Illness (HPI) 11/23/15; this is a patient I remember from a previous stay in this clinic in 2012. The patient says this was for a wound in this same area as currently although I have no information to verify that. Most of the information is provided by his caregiver from the group home where he resides. Apparently he has had a wound in this area for 8 months. He also has erythema around this area and has had multiple rounds of antibiotics apparently with some improvement but the erythema is persists. He apparently had a fracture in the ankle 2 months ago and was seen by Dr. Lajoyce Corners . He apparently had a splint applied and more recently a heel offloading boot. He had home health coming out to a week ago not clear what they are dressing this with but they apparently discharged him perhaps because of one of Dr. Audrie Lia socks The  patient has cerebral palsy and has a functional quadriparetic. He has a  Hoyer lift for transferring at home he is nonambulatory and does not wear footwear. He is not a diabetic. Looking through [place] link he appears to have fairly active primary care. The area on the ankle is variably labeled as a pressure area and/or chronic venous insufficiency. He had a recent venous ultrasound on 5/25 that did not show a DVT in the left leg however this was not a reflux study. There was no superficial DVT either. Arterial studies on 10/15/14 showed a left ABI of 1.16 Center right of 1.12 waveforms were triphasic he does not have significant arterial disease. He has had recent x-rays of the left foot and ankle. The left foot did not show any abnormality. Left ankle x-ray showed a spiral fracture of the left tibial diaphysis without evidence of osteomyelitis. 11/30/15 culture I did last week showed pseudomonas I changed him to oral ciprofloxacin. Erythema is a lot better. 12/07/15 area around the wound shows considerable improvement of the periwound erythema. 12/14/15; the patient has a improvement of the periwound erythema which in retrospect I think with cellulitis successfully treated. We have been using Iodoflex started last week 12/25/15 wound on the left medial malleolus and dorsal left foot. Theraskin #1 applied READMISSION 02/27/16; the patient was admitted to hospital from 8/14 through 01/18/16 initially felt to have cellulitis of his left lower leg however he was noted to have significant persistent pain and swelling. A duplex ultrasound was ordered that was negative. [place] an x-ray was done that showed a spiral fracture of the left leg. With posterior displacement and angulation at the mid left tibia. Orthopedics saw the patient and splinted the leg. He was then sent to [facility] skilled facility and only return to his group home last week. He has wounds on the left medial malleolus, left dorsal foot  and a worrisome area on the plantar left heel which is not really open but may represent a hematoma or a DTI 03/06/16; areas of left anterior and medial ankle both look improved. There are 3 small wounds here. He has a area over the dorsal left first metatarsal head which is not open. In meantime his left heel has opened 03/14/16 the medial left ankle wounds looks stable to improved. The area on the posterior left heel has opened up and had a very adherent eschar and slept over this 03/21/16 patient has 3 wounds on his left medial ankle is stable area on his left dorsal first metatarsal head appears to be improved. Continued problems with adherent eschar over the left heel 03/28/16; patient has 3 wounds on his left medial ankle and lower leg all of these appear to be stable. The problem continues to be the left heel and the adherent eschar. This may have been a pressure ulcer from her brace at one point. He has a Social worker we have been using [employer] and all wound areas. Dressings are being changed by home health 04/04/16; the patient's areas on the medial aspect of the left ankle looks superficial and appear to be gradually closing. The area on the plantar aspects/Achilles aspect of his left heel as surface slough and nonviable subcutaneous tissue requiring debridement. 04/11/16; the areas on the patient's medial left ankle appear to be superficial and appear to be closing. The area on the plantar heel on the left has surface slough and nonviable subcutaneous tissue requiring debridement. I don't think this is too much different from last week there is also undermining 04/25/16; patient is followed in his  group home by [facility] home health. The area on the plantar heel is smaller but with still with some depth. We're using Iodoflex to this. The areas on the left ankle looks healthy but dry and somewhat larger. Cause of this is not really clear using Prisma 05/02/16; areas x3 all look improved  expecially the areas on the medial ankle. Heel wound is smaller but still deep 05/09/16; all areas o3 look improved on the medial ankle. Heel wound is also contracting. 12/0.9/17 the areas on his left medial ankle are all closed. I think these are largely chronic venous insufficiency however there is no edema. The area on his left heel was a pressure area. READMISSION 08/02/16; this man is a patient that we have had in this clinic on several times before. I believe he has cerebral palsy is nonambulatory. He has chronic venous insufficiency with venous inflammation. He was last year in the fall of 2017 with areas on his left medial ankle left heel and left dorsal foot. He arrives today with once again a vague history. As mentioned he is nonambulatory he spends most of his day in a pressure offloading boot nevertheless they noted wounds in this area on the medial left ankle 2 weeks ago. There is any other history here I am uncertain 08/15/16; the patient's wounds on his medial ankle actually look better however the area on the left lateral/dorsal foot is covered in black necrotic eschar. Previous formal arterial studies did not show significant arterial disease with normal ABIs and triphasic waveforms. He is immobilized and at risk for pressure although he seems aware protective boots at all times as far as I am aware. 08/22/16; in general his wounds look better. All the wounds on his medial left ankle look improved the area on his lateral foot however continues to be problematic. Patient comes in the clinic today in a new wheelchair he is quite delighted 09/05/16; a wound on the left medial Calf and left dorsal foot are healed. The area on the left lateral foot is improved however the area on the left medial malleolus is inflamed with a considerable degree of surrounding erythema. This looks like cellulitis. ABIs and clinical exam are within normal limits 09/12/16; the patient continues to have a wound on  the left medial calf. I was concerned about cellulitis in this area last week and gave him a course of doxycycline. The wound looks better here. The area on the left lateral foot however is unchanged 09/20/16; the patient came in this week early after traumatizing his toe on the left foot on a table while moving around in his motorized wheelchair. 09/26/16- he is here for follow-up evaluation of his left foot and malleolus ulcers. Since his appointment he abraded his left medial hallux against a table while in his motorized wheelchair, apparently trying to pull up to the table for a meal. He continues to complain of tenderness to the left lateral foot, surrounding the current ulcer 10/07/16; left foot and malleolus ulcers. 10/14/16; the patient is doing well. He has a wound on the left lateral, left medial malleolus and atraumatic wound on the left great toe. Although these appear to be superficial and closing 10/31/16; the patient is doing well. The area on the left lateral foot, left medial malleolus and the left great toe or all epithelialized. He has home health 11/14/16; patient arrives today with the area on the left great toe healed. The left lateral foot however in the left medial malleolus and  the ankle superficial wounds that are slightly larger. He has erythema and tenderness around the area on the left lateral foot. As well he is complaining of dysuria. His caregiver notes that when he gets like this he is more lethargic which indeed seems to be the case today 11/21/16; left great toe is still healed. His wound on the left lateral foot appears healthy and slightly smaller also the area on the left medial ankle. This had black eschar on it which I removed. The remaining wound looks satisfactory 12/12/16; patient hasn't been here in a number of weeks. The area on the left lateral foot is healed however on his medial ankle has 2 open areas one of them covered with a necrotic eschar. He also has a new  wound on the right second toe which was apparently trauma induced while he was at a movie 12/27/16; the patient has wounds on his left medial mallelus and left lateral foot that are just about closed. The area on the right second toe which was trauma from last time is still open but again everything looks a lot better here. 01/30/17; patient arrives in clinic today and is really been a long time since I've seen him. The wounds are all epithelialized on the left foot this includes his left lateral foot, left medial malleolus. Apparently was in the ER for what of I think was a fungal infection on his buttock's he was prescribed Diflucan and doxycycline I think this is already getting better. READMISSION T is a now 59 year old man who has advanced cerebral palsy and functional quadriplegia. We have had him in this clinic multiple times before for wounds erry on his right ankle, left ankle, left dorsal foot and most recently from 08/02/16 through 01/30/17 with his left medial ankle and left foot and finally right second toe. His attendant from the group home where he lives is very knowledgeable about him. She states that they noticed breakdown in his skin in his feet sometime in January although they could not get an appointment until today in this clinic. The previous wounds have always been felt to be secondary to incidental trauma/pressure areas or possibly some degree of chronic venous insufficiency. He had normal arterial studies in may of 2016. He has definite open wounds on the right lateral foot over the base of the fifth metatarsal, the right second toe DIP, the left medial malleolus. He has concerning areas over the left lateral fifth metatarsal base, retaform purpura over the medial left ankle and perhaps the medial left first toe. 07/21/17; culture we did last week from the base of the right fifth metatarsal grew group G strep. This should've been sensitive to the doxycycline I gave him. The other  major wounds are on the right second toe DIP, left medial malleolus, base of the left lateral fifth metatarsal and the medial left ankle and medial left toe. 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 We have macrovascular arterial studies scheduled for Thursday although I don't think this is what the problem is. 07/28/17; patient arrives with really no improvement. He has a wound on the right second toe, lateral right foot, right medial malleolus which was new this week, left medial malleolus, but looked like a DTI on the left great toe. His macrovascular studies are on 07/30/17 although I would be surprised if this provides all the explanation. He has small areas even on his right second and third toe which look like splinter-type hemorrhages. The question I have is  where is this coming from. I'd like to see the vascular studies however before we pursue this. He has had a DVT in the past but is no longer on anticoagulants. I'll need to review his history 08/04/17; the patient had his ARTERIAL STUDIES this showed a right ABI of 1.16 and the left ABI of 1.20. TBI on the left at 0.84. He is at a right first toe amputation. The studies were unchanged from studies in May 2016. It was not felt that he had significant lower extremity arterial disease In the meantime we are using silver alginate to the wounds on the right second toe right lateral foot left medial malleolus. I'm going to change to Silver collagen today. 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 Working him up for this would include an echocardiogram probably a CT scan of the chest and abdomen to look at the major thoracic and abdominal aortic vessels. I am not going to involve myself in this unless there is more symptoms/signs 08/18/17; the patient arrives today with wounds looking somewhat better. The  remaining areas are the right second toe dorsal, right lateral foot and left medial malleolus which only has a small open area. We've been using silver collagen 09/01/17;right second toe dorsally probes to bone today. This is a deterioration. We've been using silver collagen. Only a small open area remains on the left medial malleolus. 09/09/17; right second toe appears better. There is no probing bone. He has a small area remaining on the left medial malleolus, the right lateral foot. We have been using silver collagen 10/07/17; the patient returns to clinic today after a month hiatus. He has wounds over the right second toe, right lateral foot and the left medial malleolus. We have been using silver collagen. He has home health. 10/24/17; patient has open wounds over the right second toe, now the left fourth toe and a superficial area over his left buttock. Some of the other areas have healed including the left medial malleolus and right lateral foot. Using silver collagen on these wounds 11/07/17; the patient has closed his wounds on the right second toe and left fourth toe although they look all normal. Last time he was here he had a superficial area over the left buttock this is also closed. The right lateral foot wound I had closed out last time although it is seems to have reopened today. 628/19 the areas over the right second and left fourth toe remains closed. The right lateral foot has tightly adherent necrotic surface over. We have been using silver collagen he offloads this and a bunny boot 12/12/17; the patient has reopened the recurrent area over the right second PIP joint. 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 He does not have obvious macrovascular disease. The area over the right lateral foot looks about the same as last time tightly adherent necrotic debris. We've been using Hydrofera Blue 12/25/17; 2 week follow-up. The area over  the right second PIP is closed however the area on the right lateral foot is worse with surrounding erythema and pain compatible with cellulitis is going to need an antibiotic. ooAS WELL..... We have looked at his buttocks and lower back. He has 3 stage II ulcers. The exact history here is unclear however he is very disabled man with cerebral palsy. He goes to a  day program from the group home in which she lives 5 days a week and I think he is on his buttock all day on these visits. Surrounding the wounds see has extensive stage I injury as well. ooFINALLY it looks as though he has lost weight. I'm not the only staff member that is recognized this 01/08/18 on evaluation today patient appears to be doing better in regard to his gluteal wounds. In fact everything appears to be healed back here although very dry. He has a couple open sores on the scrotal region other than this it is really his foot that is still giving him the most trouble. He did complete the Keflex unfortunately it seems like there's still a lot of erythema in this in fact looks worse than it did previous. I'm gonna culture this today and likely start with a different antibiotic. 01/16/18; I was reassured that the wounds on his buttocks and sacrum are healed. I did not look at these the day. The culture that was done last week on 01/11/18 of the right lateral foot wounds showed Pseudomonas. Bactrim is not a good choice for this. Ciprofloxacin interacts with his muscle relaxants therefore I prescribed cefdinir 300 twice a day for 7 days. Bactrim coverage of pseudomonas is not reliable although the degree of erythema looks a lot better 01/30/18; the patient has completed his antibiotics. The area does not have infection on the right lateral foot. The wounds look a lot better. We've been using silver alginate 02/12/18; there is on the right lateral foot. Copious amounts of surface eschar. Also similarly over the PIP of the right second toe.  We've been using silver alginate to date 03/02/2018; the area that remains is on the right lateral foot. The right second toe is healed. 03/16/2018; the area there is original wound on the right lateral foot. Still requiring debridement. We are using Hydrofera Blue. The right second toe is still healed/closed 03/30/2018; right lateral foot there is 2 small open areas remaining. They actually look quite healthy we have been using Hydrofera Blue the right second toe remains closed 04/13/2018; right lateral foot. 1 of the 2 small open areas from last time has closed over. The other had surface slough that I removed with a #3 curette. This looks quite healthy. The right second toe remains closed. We are using Hydrofera Blue 05/01/2018; right lateral foot very superficial areas that look like they are trying to close over. He had a new threatened area on the left medial ankle just below the medial malleolus. I think these represent chronic venous insufficiency areas 05/14/2018; the right right lateral foot looks like it closed over as was predicted last time however he has some dark areas subcutaneously erythema superiorly and this is really very tender. This is either coexistent cellulitis or perhaps a deep tissue injury. The area on the left medial ankle is superficial but has some size. We have been using Hydrofera Blue to both areas. The patient complains of pain in his right foot 06/09/2018; right lateral foot was closed over last time but there was coexistent cellulitis around this. I gave him Levaquin. He also has an area on the left medial malleolus. He has not been back to clinic in over 3-1/2 weeks. We are using Hydrofera Blue I think to the left medial malleolus and silver alginate to the right lateral foot 1/21; right lateral foot still non-viable debris over this area and the left medial malleolus. Both of these vigorously washed off with  antiseptic gauze. We have been using silver  alginate. 2/4; right lateral foot this looks somewhat better. Unfortunately the area over the left medial malleolus has deteriorated. Using silver alginate in both area 2/18; both wounds arrived today covered in a lot of necrotic debris. We have been using silver alginate. Clearly not making a lot of progress. 3/3; the patient's right second toe is reopened over the PIP. I am not sure why this is happening. He may have microvascular disease. We have checked his macrovascular flow in the past and found it to be normal. He may need an x-ray. The area on the right lateral foot requires debridement. The area on the left lateral ankle is worse. We have been using PolyMem Ag 08/26/18 on evaluation today patient appears to be doing a little worse in regard to his lower extremities based on what I'm seeing what the nursing staff is telling me. His left medial ankle appears potential to be infected there is your thing on want surrounding the wound. His right second toe is also of concern at this point in my pinion. We have not done x-rays of these areas that may be a good idea to go ahead and proceed with at this point to ensure will not deal with anything such as osteomyelitis. 4/16; it is been a while since I saw this patient. He now has wounds on the right lateral foot, dorsal right second toe, substantially on the left medial malleolus and a small area on the left lateral foot. We have been using PolyMem Silver. Curlex and Coban. He has home health changing the dressing. Since last time he was here he had a culture of the left medial ankle that showed MRSA. He is completed a course/7 days of doxycycline. XRAYS of the left ankle and right foot that were ordered were negative for osteomyelitis 4/30; he arrives in clinic today with some maceration around the wound and a lot of tenderness and erythema in a circumferential area around the right lateral foot. He has wound areas on the right lateral foot dorsal  right second toe, largely over the left medial malleolus and a small area over the left lateral foot 5/7; completed his antibiotics from last time right lateral foot cellulitis is better and the area on his left knee which we discovered after he had been seen by the provider also is improved. He still has extensive wound areas over the right lateral foot, left medial malleolus, right second toe and left lateral foot although all of these look somewhat better with PolyMem 5/15. Wounds on the right lateral foot, right second toe left medial malleolus and the left lateral foot. All of these look somewhat better. We have been using PolyMem Ag 5/26; the wound on the right lateral foot is almost closed over but he still complains of a lot of pain in this area. He has a eschared area on the right second toe but no open wound.Ge[place]ry [place]. ooAlso concerning today his original wound on the left medial malleolus looks healthy however just inferior to this almost areas of deep tissue injury and there is an area on the left lateral foot also looks like a deep tissue injury. In the past I have wondered whether he might have cholesterol emboli or some other form of a vasculopathy. I wonder whether this could have something to do with this. 6/2; right lateral foot wound is still open I gave him empiric antibiotics last week without really a lot of change she is  still complaining of pain in this area. We x- rayed his feet at some point in April these were negative. It would be difficult to do an MRI. The area on his left medial malleolus looks better. Right second toe is healed. He still has what looks to be a small pressure related area on the left lateral foot but it is not open. We are using PolyMem to all wound which seems to be doing nicely 6/16; right lateral foot wound is still open. The wound looks better although there is still a lot of tenderness around it. Rather than more empiric antibiotics I have cultured this  today. We are using PolyMem Ag. On the left lateral malleolus the wound appears better. He has a new area on the left lower anterior pretibial area 6/29; 2-week follow-up. Culture I did of the right lateral foot 2 weeks ago showed MRSA. I gave him 2 weeks of Septra which he should be just about finishing now and this seems better. We got a call from home health last week to report swelling in the left leg because we were listed as his primary. We referred him back to primary care. He arrives today with intense erythema and tenderness around the 2 wounds on the left medial ankle on the left lower calf. The area on the right lateral foot looks better 7/7; I brought him back this week to follow-up on his left foot which had cellulitis last week. I gave him clindamycin which as far as I know he tolerated. The erythema is better. He has a 3 current wounds one on the right lateral foot a small area on the right lateral malleolus and the more recent area on the left anterior pretibial area. 7/21; bilateral distal lower leg and foot wounds. The area on his right lateral foot looks a lot better. He has an area on his left pretibial area distally. The area on the medial malleolus on the left which is 1 of his worst wounds has closed over. He does not appear to have any open areas in his remaining toes 8/4-Patient returns at 2 weeks, he has the remaining wound on the left lower leg the other 2 wounds have healed. Patient is continued to be treated with silver alginate with a 2 layer wrap on the left 8/18; I been following this patient for a long period for bilateral lower extremity wounds. 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 His wounds are probably pressure although he religiously wears thick bunny boots. He does not have macrovascular disease but I did formal testing on him perhaps 2 or 3 years ago. I have often wondered about either cholesterol emboli or  a vasculopathy of some sort given the distal nature of these variable locations etc. He has 2 areas that we have been following one on the left medial lower leg and one on the right lateral foot. Both of these are mostly healed today there is some eschar over sections of both areas although I did not disturb this. 03/31/2019 on evaluation today patient appears to be doing a little bit more poorly in regard to the sacral region. He has 2 small wounds noted at this point. Fortunately there is no signs of active infection at this time. No fevers, chills, nausea, vomiting, or diarrhea. He is having some pain when he sits on his gluteal region a big part of the reason he has these wounds is likely due to the fact  that his wheelchair has not been functioning properly. He does still have a couple areas of deep tissue injury on his lower extremities he does need new Prevalon offloading boots as well. 04/28/2019 on evaluation today patient appears to be doing well with regard to his wounds in particular. His ankle area has fairly small based on what I am seeing currently and his sacral region actually is still open but does not appear to be doing too poorly in my opinion. Fortunately there is no signs of active infection at this time. He was in the hospital unfortunately and this was from 04/08/2019 through 04/12/2019 secondary to persistent diarrhea, dehydration and acute kidney injury. With that being said fortunately the patient does not seem to be doing too poorly at this time which is good news. He is having pain mainly in the sacral region. 06/23/2019 on evaluation today patient appears to be doing well with regard to his sacral region which in fact is completely healed. Unfortunately he has an area of rubbing/pressure on his right lateral chest/back region. This is where it is rubbing up on his chair. Subsequently he also has an open wound on the left medial malleolus which is not very open but starting to  crack and then on the right lateral foot this is much more open at this point. Unfortunately there is no signs of significant improvement in regard to these areas and in fact there does appear to be evidence of active infection quite significantly. No fevers, chills, nausea, vomiting, or diarrhea. The patient did test positive for COVID-19 that has been greater than 14 days ago although they did not know the exact timing. That is why he was not here for his last appointment. 07/07/2019 upon evaluation today patient actually appears to be doing quite well in regard to his left ankle as well as the chest/back region which is doing great in fact appears to be healed in my opinion. He still has an issue on his lateral right foot that is open and appears to be infected but does have me more concerned. In fact I do believe the doxycycline helped in general for the cellulitis but I believe that may have been more MRSA related based on the culture that we did. What I am seeing right now has more of the blue-green drainage and may be more consistent with a Pseudomonas that I was hoping was not really causing much of an issue but I think it may be at this point on the right lateral foot. For that reason I do think treatment would be a good idea of the reason I avoided the Cipro before was the potential for QT prolongation which could obviously cause problems with the patient with results of interaction with respiratory wound. The tizanidine also had some interactions as well. Nonetheless I think at this point that it would be a possibility for [place] to use topical gentamicin to see if this could be of benefit for the patient underneath the silver alginate dressing. 07/21/2019 upon evaluation today patient appears to be doing fairly well at this point in regard to his lower extremity on the right. He does have a wound on his right lateral foot as well as the right fifth toe and the right fourth toe. Unfortunately he  continues to experience significant issues with pressure and trauma to some degree and I think this is just due to the fact that again he is not really not able to control his legs and what  is happening here. Fortunately there is no signs of active infection at this time. 08/04/2019 upon evaluation today patient actually appears to be doing quite well with regard to his toe ulcers I am very pleased in that regard. Unfortunately the foot ulcer is showing some signs of erythema his primary care provider has placed him on clindamycin at this point. Again I do believe this can be beneficial for him. With that being said I do think the patient may benefit from a light Kerlix and Coban wrap to help with some of his edema at this point. 08/18/2019 upon evaluation today patient appears to be doing still poorly in regard to his lateral foot ulcer. This is not very deep but there is some concern about the possibility of osteomyelitis. I think it would be appropriate for [place] to go ahead and have an x-ray at this time. The patient is currently on clindamycin which from his culture which was performed on 06/23/2019 it does appear that he would benefit from that in regard to the MRSA. However Pseudomonas was also identified then and the patient actually has some blue-green drainage at this point as well. I am much more concerned about the possibility of the Pseudomonas being an infection is causative agent at this point that I am the MRSA. Subsequently Cipro the patient was sensitive to but we never use that is stable use a topical gent due to the fact that unfortunately there was some interaction between the Cipro and the patient's risk for down potentially causing QT prolongation. 08/31/2019; have not seen this patient in quite a period of time. He has a substantial wound on his right lateral foot we have been using gentamicin ointment with Hydrofera Blue over the top. From what I am able to determine he is also on  antibiotics perhaps clindamycin although I have not had a chance to look over this. He also has an eschared area on the right fifth toe as well as the right second toe. 4/20; right lateral foot wound. He comes in today with intense erythema and tenderness around this wound. The wound itself has a surface on it but it doesn't look particularly healthy a lot of gelatinous debris. There is no palpable bone. We've been using gentamicin ointment and Hydrofera Blue. Since the patient was last here he was seen by Dr. Algis Liming of infectious disease. He ordered an MRI that I think is due later this month on 4/30. He apparently also checked a sedimentation rate and C-reactive protein metabolic panel CBC with differential. At the time he was seen he wanted to withhold any microbials pending identification of the depth of the infection although looking at this I don't know that I'm going to be able to hold off for another 10 days. 4/29; he went on to have the MRI that was ordered by Dr. Algis Liming of infectious disease. 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 I had given him cefdinir last week for surrounding cellulitis he is finishing this today. I have communicated with Dr. Algis Liming who wants bone for pathology and culture before proceeding with consideration of IV antibiotics. Fortunately he is due to see Dr. Samuella Cota of podiatry today I am hopeful that the bone biopsy and culture will be considered there Objective Constitutional Patient is hypertensive.. Pulse regular and within target range for patient.Marland Kitchen Respirations regular, non-labored and within target range.. Temperature is normal and within the target range for the patient.Marland Kitchen Appears in no distress. Vitals Time  Taken: 7:52 AM, Height: 73 in, Weight: 178 lbs, BMI: 23.5, Temperature: 98 F, Pulse: 85 bpm, Respiratory Rate: 19 breaths/min, Blood Pressure: 149/84 mmHg. Respiratory work of breathing is  normal. Cardiovascular Pedal pulses are reduced at the dorsalis pedis but still faintly palpable. General Notes: Wound exam; right lateral foot this looks a lot better than when I saw him 9 days ago. There is less erythema more viable looking surface still some tenderness however. The wound itself does not have exposed bone major area is over the base of the fifth metatarsal and just proximal to this. I am uncertain about whether it is going to be easy to go through the wound to get bone or not I will let Dr. Samuella Cota look at this. Might have to go more proximally than that Integumentary (Hair, Skin) Wound #37 status is Open. Original cause of wound was Pressure Injury. The wound is located on the Right,Lateral Foot. The wound measures 4.2cm length x 2.7cm width x 0.2cm depth; 8.906cm^2 area and 1.781cm^3 volume. There is Fat Layer (Subcutaneous Tissue) Exposed exposed. There is no tunneling or undermining noted. There is a medium amount of serous drainage noted. The wound margin is flat and intact. There is medium (34-66%) pink granulation within the wound bed. There is a medium (34-66%) amount of necrotic tissue within the wound bed including Adherent Slough. Wound #38 status is Open. Original cause of wound was Shear/Friction. The wound is located on the Right T Fifth. The wound measures 0.5cm length x oe 0.8cm width x 0.1cm depth; 0.314cm^2 area and 0.031cm^3 volume. There is Fat Layer (Subcutaneous Tissue) Exposed exposed. There is no tunneling or undermining noted. There is a small amount of serous drainage noted. The wound margin is distinct with the outline attached to the wound base. There is no granulation within the wound bed. There is a large (67-100%) amount of necrotic tissue within the wound bed including Eschar. 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 - Tuesday with Dr. Leanord Hawking. ***Facility to schedule patient to see Dr. Samuella Cota regarding bone biopsy. *** Dressing Change Frequency: Wound #37 Right,Lateral Foot: Change dressing every day. Wound #38 Right T Fifth: oe Change dressing every day. Wound Cleansing: Wound #37 Right,Lateral Foot: Clean wound with Wound Cleanser Wound #38 Right T Fifth: oe Clean wound with Wound Cleanser Primary Wound Dressing: Wound #37 Right,Lateral Foot: Calcium Alginate with Silver Wound #38 Right T Fifth: oe Calcium Alginate with Silver Secondary Dressing: Wound #37 Right,Lateral Foot: Kerlix/Rolled Gauze Dry Gauze Wound #38 Right T Fifth: oe Kerlix/Rolled Gauze Dry Gauze Edema Control: Kerlix and Coban - Right Lower Extremity Off-Loading: Multipodus Splint to: - sage boots or equivalent at all times Turn and reposition every 2 hours Additional Orders / Instructions: Other: - discontinue Gentamycin ointment. discontinue oral Cefdinir antibiotic. Home Health: Continue Home Health skilled nursing for wound care. - [facility] 1. Hopefully Dr. Samuella Cota will be able to get bone specimin for pathology and culture of the proximal part of the fifth metatarsal identified is having osteomyelitis on his recent MRI of 4/26 2. The soft tissue and cellulitis part of this that identified 9 days ago looks a lot better. Previously his swab cultures of all grown Pseudomonas Electronic Signature(s) Signed: 09/23/2019 5:41:26 PM By: Baltazar Najjar MD Entered By: Baltazar Najjar on 09/23/2019 08:29:54 -------------------------------------------------------------------------------- SuperBill Details Patient  Name: Date of Service: CYRIL, WOODMANSEE 09/23/2019 Medical Record Number: [id number] Patient Account Number: [id number] Date of Birth/Sex: Treating RN: 24-Jul-1960 (59 y.o. [name] Primary Care Provider: Orpah Cobb Other  Clinician: Referring Provider: Treating Provider/Extender: Candiss Norse in Treatment: 114 Diagnosis Coding ICD-10 Codes Code Description [phone] Pressure ulcer of other site, stage 2 L03.115 Cellulitis of right lower limb L89.893 Pressure ulcer of other site, stage 3 I73.89 Other specified peripheral vascular diseases Facility Procedures The patient participates with Medicare or their insurance follows the Medicare Facility Guidelines: CPT4 Code Description Modifier Quantity [id number] [phone] - WOUND CARE VISIT-LEV 5 EST PT 1 Physician Procedures : CPT4 Code Description Modifier [id number] - WC PHYS LEVEL 3 - EST PT 1 ICD-10 Diagnosis Description L89.892 Pressure ulcer of other site, stage 2 L03.115 Cellulitis of right lower limb Quantity: Electronic Signature(s) Signed: 09/23/2019 5:41:26 PM By: Baltazar Najjar MD Entered By: Baltazar Najjar on 09/23/2019 08:30:14

## 2019-09-23 NOTE — Patient Instructions (Signed)
Pre-Operative Instructions  Congratulations, you have decided to take an important step towards improving your quality of life.  You can be assured that the doctors and staff at [facility] will be with you every step of the way.  Here are some important things you should know:  1. Plan to be at the surgery center/hospital at least 1 (one) hour prior to your scheduled time, unless otherwise directed by the surgical center/hospital staff.  You must have a responsible adult accompany you, remain during the surgery and drive you home.  Make sure you have directions to the surgical center/hospital to ensure you arrive on time. 2. If you are having surgery at [facility] or [facility] hospitals, you will need a copy of your medical history and physical form from your family physician within one month prior to the date of surgery. We will give you a form for your primary physician to complete.  3. We make every effort to accommodate the date you request for surgery.  However, there are times where surgery dates or times have to be moved.  We will contact you as soon as possible if a change in schedule is required.   4. No aspirin/ibuprofen for one week before surgery.  If you are on aspirin, any non-steroidal anti-inflammatory medications (Mobic, Aleve, Ibuprofen) should not be taken seven (7) days prior to your surgery.  You make take Tylenol for pain prior to surgery.  5. Medications - If you are taking daily heart and blood pressure medications, seizure, reflux, allergy, asthma, anxiety, pain or diabetes medications, make sure you notify the surgery center/hospital before the day of surgery so they can tell you which medications you should take or avoid the day of surgery. 6. No food or drink after midnight the night before surgery unless directed otherwise by surgical center/hospital staff. 7. No alcoholic beverages 24-hours prior to surgery.  No smoking 24-hours prior or 24-hours after  surgery. 8. Wear loose pants or shorts. They should be loose enough to fit over bandages, boots, and casts. 9. Don't wear slip-on shoes. Sneakers are preferred. 10. Bring your boot with you to the surgery center/hospital.  Also bring crutches or a walker if your physician has prescribed it for you.  If you do not have this equipment, it will be provided for you after surgery. 11. If you have not been contacted by the surgery center/hospital by the day before your surgery, call to confirm the date and time of your surgery. 12. Leave-time from work may vary depending on the type of surgery you have.  Appropriate arrangements should be made prior to surgery with your employer. 13. Prescriptions will be provided immediately following surgery by your doctor.  Fill these as soon as possible after surgery and take the medication as directed. Pain medications will not be refilled on weekends and must be approved by the doctor. 14. Remove nail polish on the operative foot and avoid getting pedicures prior to surgery. 15. Wash the night before surgery.  The night before surgery wash the foot and leg well with water and the antibacterial soap provided. Be sure to pay special attention to beneath the toenails and in between the toes.  Wash for at least three (3) minutes. Rinse thoroughly with water and dry well with a towel.  Perform this wash unless told not to do so by your physician.  Enclosed: 1 Ice pack (please put in freezer the night before surgery)   1 Hibiclens skin cleaner     Pre-op instructions  If you have any questions regarding the instructions, please do not hesitate to call our office.  [place]: [address], [place], [place] [address] -- [phone]  [name]: [address] [place], [place] [address] -- [phone]  [place]: [address], [place], [place] [address] -- [phone]   Website: [email or web address] 

## 2019-09-28 ENCOUNTER — Encounter (HOSPITAL_BASED_OUTPATIENT_CLINIC_OR_DEPARTMENT_OTHER): Payer: Medicare Other | Attending: Internal Medicine | Admitting: Internal Medicine

## 2019-09-29 NOTE — Progress Notes (Signed)
[name], [name] ([id number]) Visit Report for 09/28/2019 HPI Details Patient Name: Date of Service: [name], [name] 09/28/2019 10:45 A M Medical Record Number: [id number] Patient Account Number: [id number] Date of Birth/Sex: Treating RN: March 29, 1961 (59 y.o. [name]) [name] Primary Care Provider: Orpah Cobb Other Clinician: Referring Provider: Treating Provider/Extender: Candiss Norse in Treatment: 115 History of Present Illness HPI Description: 11/23/15; this is a patient I remember from a previous stay in this clinic in 2012. The patient says this was for a wound in this same area as currently although I have no information to verify that. Most of the information is provided by his caregiver from the group home where he resides. Apparently he has had a wound in this area for 8 months. He also has erythema around this area and has had multiple rounds of antibiotics apparently with some improvement but the erythema is persists. He apparently had a fracture in the ankle 2 months ago and was seen by Dr. Lajoyce Corners . He apparently had a splint applied and more recently a heel offloading boot. He had home health coming out to a week ago not clear what they are dressing this with but they apparently discharged him perhaps because of one of Dr. 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 He has a Nurse, adult for transferring at home he is nonambulatory and does not wear footwear. He is not a diabetic. Looking through  link he appears to have fairly active primary care. The area on the ankle is variably labeled as a pressure area and/or chronic venous insufficiency. He had a recent venous ultrasound on 5/25 that did not show a DVT in the left leg however this was not a reflux study. There was no superficial DVT either. Arterial studies on 10/15/14 showed a left ABI of 1.16 Center right of 1.12 waveforms were triphasic he does not have  significant arterial disease. He has had recent x-rays of the left foot and ankle. The left foot did not show any abnormality. Left ankle x-ray showed a spiral fracture of the left tibial diaphysis without evidence of osteomyelitis. 11/30/15 culture I did last week showed pseudomonas I changed him to oral ciprofloxacin. Erythema is a lot better. 12/07/15 area around the wound shows considerable improvement of the periwound erythema. 12/14/15; the patient has a improvement of the periwound erythema which in retrospect I think with cellulitis successfully treated. We have been using Iodoflex started last week 12/25/15 wound on the left medial malleolus and dorsal left foot. Theraskin #1 applied READMISSION 02/27/16; the patient was admitted to hospital from 8/14 through 01/18/16 initially felt to have cellulitis of his left lower leg however he was noted to have significant persistent pain and swelling. A duplex ultrasound was ordered that was negative. Obie Dredge an x-ray was done that showed a spiral fracture of the left leg. With posterior displacement and angulation at the mid left tibia. Orthopedics saw the patient and splinted the leg. He was then sent to [facility] skilled facility and only return to his group home last week. He has wounds on the left medial malleolus, left dorsal foot and a worrisome area on the plantar left heel which is not really open but may represent a hematoma or a DTI 03/06/16; areas of left anterior and medial ankle both look improved. There are 3 small wounds here. He has a area over the dorsal left first metatarsal head which is not open. In meantime his left  heel has opened 03/14/16 the medial left ankle wounds looks stable to improved. The area on the posterior left heel has opened up and had a very adherent eschar and slept over this 03/21/16 patient has 3 wounds on his left medial ankle is stable area on his left dorsal first metatarsal head appears to be improved.  Continued problems with adherent eschar over the left heel 03/28/16; patient has 3 wounds on his left medial ankle and lower leg all of these appear to be stable. The problem continues to be the left heel and the adherent eschar. This may have been a pressure ulcer from her brace at one point. He has a English as a second language teacher we have been using [employer] and all wound areas. Dressings are being changed by home health 04/04/16; the patient's areas on the medial aspect of the left ankle looks superficial and appear to be gradually closing. The area on the plantar aspects/Achilles aspect of his left heel as surface slough and nonviable subcutaneous tissue requiring debridement. 04/11/16; the areas on the patient's medial left ankle appear to be superficial and appear to be closing. The area on the plantar heel on the left has surface slough and nonviable subcutaneous tissue requiring debridement. I don't think this is too much different from last week there is also undermining 04/25/16; patient is followed in his group home by [facility] home health. The area on the plantar heel is smaller but with still with some depth. We're using Iodoflex to this. The areas on the left ankle looks healthy but dry and somewhat larger. Cause of this is not really clear using Prisma 05/02/16; areas x3 all look improved expecially the areas on the medial ankle. Heel wound is smaller but still deep 05/09/16; all areas 3 look improved on the medial ankle. Heel wound is also contracting. 12/0.9/17 the areas on his left medial ankle are all closed. I think these are largely chronic venous insufficiency however there is no edema. The area on his left heel was a pressure area. READMISSION 08/02/16; this man is a patient that we have had in this clinic on several times before. I believe he has cerebral palsy is nonambulatory. He has chronic venous insufficiency with venous inflammation. He was last year in the fall of 2017 with areas on his  left medial ankle left heel and left dorsal foot. He arrives today with once again a vague history. As mentioned he is nonambulatory he spends most of his day in a pressure offloading boot nevertheless they noted wounds in this area on the medial left ankle 2 weeks ago. There is any other history here I am uncertain 08/15/16; the patient's wounds on his medial ankle actually look better however the area on the left lateral/dorsal foot is covered in black necrotic eschar. Previous formal arterial studies did not show significant arterial disease with normal ABIs and triphasic waveforms. He is immobilized and at risk for pressure although he seems aware protective boots at all times as far as I am aware. 08/22/16; in general his wounds look better. All the wounds on his medial left ankle look improved the area on his lateral foot however continues to be problematic. Patient comes in the clinic today in a new wheelchair he is quite delighted 09/05/16; a wound on the left medial Calf and left dorsal foot are healed. The area on the left lateral foot is improved however the area on the left medial malleolus is inflamed with a considerable degree of surrounding erythema. This  looks like cellulitis. ABIs and clinical exam are within normal limits 09/12/16; the patient continues to have a wound on the left medial calf. I was concerned about cellulitis in this area last week and gave him a course of doxycycline. The wound looks better here. The area on the left lateral foot however is unchanged 09/20/16; the patient came in this week early after traumatizing his toe on the left foot on a table while moving around in his motorized wheelchair. 09/26/16- he is here for follow-up evaluation of his left foot and malleolus ulcers. Since his appointment he abraded his left medial hallux against a table while in his motorized wheelchair, apparently trying to pull up to the table for a meal. He continues to complain of  tenderness to the left lateral foot, surrounding the current ulcer 10/07/16; left foot and malleolus ulcers. 10/14/16; the patient is doing well. He has a wound on the left lateral, left medial malleolus and atraumatic wound on the left great toe. Although these appear to be superficial and closing 10/31/16; the patient is doing well. The area on the left lateral foot, left medial malleolus and the left great toe or all epithelialized. He has home health 11/14/16; patient arrives today with the area on the left great toe healed. The left lateral foot however in the left medial malleolus and the ankle superficial wounds that are slightly larger. He has erythema and tenderness around the area on the left lateral foot. As well he is complaining of dysuria. His caregiver notes that when he gets like this he is more lethargic which indeed seems to be the case today 11/21/16; left great toe is still healed. His wound on the left lateral foot appears healthy and slightly smaller also the area on the left medial ankle. This had black eschar on it which I removed. The remaining wound looks satisfactory 12/12/16; patient hasn't been here in a number of weeks. The area on the left lateral foot is healed however on his medial ankle has 2 open areas one of them covered with a necrotic eschar. He also has a new wound on the right second toe which was apparently trauma induced while he was at a movie 12/27/16; the patient has wounds on his left medial mallelus and left lateral foot that are just about closed. The area on the right second toe which was trauma from last time is still open but again everything looks a lot better here. 01/30/17; patient arrives in clinic today and is really been a long time since I've seen him. The wounds are all epithelialized on the left foot this includes his left lateral foot, left medial malleolus. Apparently was in the ER for what of I think was a fungal infection on his buttock's he was  prescribed Diflucan and doxycycline I think this is already getting better. READMISSION T is a now 59 year old man who has advanced cerebral palsy and functional quadriplegia. We have had him in this clinic multiple times before for wounds erry on his right ankle, left ankle, left dorsal foot and most recently from 08/02/16 through 01/30/17 with his left medial ankle and left foot and finally right second toe. His attendant from the group home where he lives is very knowledgeable about him. She states that they noticed breakdown in his skin in his feet sometime in January although they could not get an appointment until today in this clinic. The previous wounds have always been felt to be secondary to incidental trauma/pressure areas  or possibly some degree of chronic venous insufficiency. He had normal arterial studies in may of 2016. He has definite open wounds on the right lateral foot over the base of the fifth metatarsal, the right second toe DIP, the left medial malleolus. He has concerning areas over the left lateral fifth metatarsal base, retaform purpura over the medial left ankle and perhaps the medial left first toe. 07/21/17; culture we did last week from the base of the right fifth metatarsal grew group G strep. This should've been sensitive to the doxycycline I gave him. The other major wounds are on the right second toe DIP, left medial malleolus, base of the left lateral fifth metatarsal and the medial left ankle and medial left toe. 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 We have macrovascular arterial studies scheduled for Thursday although I don't think this is what the problem is. 07/28/17; patient arrives with really no improvement. He has a wound on the right second toe, lateral right foot, right medial malleolus which was new this week, left medial malleolus, but looked like a DTI on the left great toe. His macrovascular studies are on 07/30/17 although I would be  surprised if this provides all the explanation. He has small areas even on his right second and third toe which look like splinter-type hemorrhages. The question I have is where is this coming from. I'd like to see the vascular studies however before we pursue this. He has had a DVT in the past but is no longer on anticoagulants. I'll need to review his history 08/04/17; the patient had his ARTERIAL STUDIES this showed a right ABI of 1.16 and the left ABI of 1.20. TBI on the left at 0.84. He is at a right first toe amputation. The studies were unchanged from studies in May 2016. It was not felt that he had significant lower extremity arterial disease In the meantime we are using silver alginate to the wounds on the right second toe right lateral foot left medial malleolus. I'm going to change to Silver collagen today. 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 Working him up for this would include an echocardiogram probably a CT scan of the chest and abdomen to look at the major thoracic and abdominal aortic vessels. I am not going to involve myself in this unless there is more symptoms/signs 08/18/17; the patient arrives today with wounds looking somewhat better. The remaining areas are the right second toe dorsal, right lateral foot and left medial malleolus which only has a small open area. We've been using silver collagen 09/01/17;right second toe dorsally probes to bone today. This is a deterioration. We've been using silver collagen. Only a small open area remains on the left medial malleolus. 09/09/17; right second toe appears better. There is no probing bone. He has a small area remaining on the left medial malleolus, the right lateral foot. We have been using silver collagen 10/07/17; the patient returns to clinic today after a month hiatus. He has wounds over the right  second toe, right lateral foot and the left medial malleolus. We have been using silver collagen. He has home health. 10/24/17; patient has open wounds over the right second toe, now the left fourth toe and a superficial area over his left buttock. Some of the other areas have healed including the left medial malleolus and right lateral foot. Using silver collagen on these  wounds 11/07/17; the patient has closed his wounds on the right second toe and left fourth toe although they look all normal. Last time he was here he had a superficial area over the left buttock this is also closed. The right lateral foot wound I had closed out last time although it is seems to have reopened today. 628/19 the areas over the right second and left fourth toe remains closed. The right lateral foot has tightly adherent necrotic surface over. We have been using silver collagen he offloads this and a bunny boot 12/12/17; the patient has reopened the recurrent area over the right second PIP joint. 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 He does not have obvious macrovascular disease. The area over the right lateral foot looks about the same as last time tightly adherent necrotic debris. We've been using Hydrofera Blue 12/25/17; 2 week follow-up. The area over the right second PIP is closed however the area on the right lateral foot is worse with surrounding erythema and pain compatible with cellulitis is going to need an antibiotic. AS WELL..... We have looked at his buttocks and lower back. He has 3 stage II ulcers. The exact history here is unclear however he is very disabled man with cerebral palsy. He goes to a day program from the group home in which she lives 5 days a week and I think he is on his buttock all day on these visits. Surrounding the wounds see has extensive stage I injury as well. FINALLY it looks as though he has lost weight. I'm not the only  staff member that is recognized this 01/08/18 on evaluation today patient appears to be doing better in regard to his gluteal wounds. In fact everything appears to be healed back here although very dry. He has a couple open sores on the scrotal region other than this it is really his foot that is still giving him the most trouble. He did complete the Keflex unfortunately it seems like there's still a lot of erythema in this in fact looks worse than it did previous. I'm gonna culture this today and likely start with a different antibiotic. 01/16/18; I was reassured that the wounds on his buttocks and sacrum are healed. I did not look at these the day. The culture that was done last week on 01/11/18 of the right lateral foot wounds showed Pseudomonas. Bactrim is not a good choice for this. Ciprofloxacin interacts with his muscle relaxants therefore I prescribed cefdinir 300 twice a day for 7 days. Bactrim coverage of pseudomonas is not reliable although the degree of erythema looks a lot better 01/30/18; the patient has completed his antibiotics. The area does not have infection on the right lateral foot. The wounds look a lot better. We've been using silver alginate 02/12/18; there is on the right lateral foot. Copious amounts of surface eschar. Also similarly over the PIP of the right second toe. We've been using silver alginate to date 03/02/2018; the area that remains is on the right lateral foot. The right second toe is healed. 03/16/2018; the area there is original wound on the right lateral foot. Still requiring debridement. We are using Hydrofera Blue. The right second toe is still healed/closed 03/30/2018; right lateral foot there is 2 small open areas remaining. They actually look quite healthy we have been using Hydrofera Blue the right second toe remains closed 04/13/2018; right lateral foot. 1 of the 2 small open  areas from last time has closed over. The other had surface slough that I removed  with a #3 curette. This looks quite healthy. The right second toe remains closed. We are using Hydrofera Blue 05/01/2018; right lateral foot very superficial areas that look like they are trying to close over. He had a new threatened area on the left medial ankle just below the medial malleolus. I think these represent chronic venous insufficiency areas 05/14/2018; the right right lateral foot looks like it closed over as was predicted last time however he has some dark areas subcutaneously erythema superiorly and this is really very tender. This is either coexistent cellulitis or perhaps a deep tissue injury. The area on the left medial ankle is superficial but has some size. We have been using Hydrofera Blue to both areas. The patient complains of pain in his right foot 06/09/2018; right lateral foot was closed over last time but there was coexistent cellulitis around this. I gave him Levaquin. He also has an area on the left medial malleolus. He has not been back to clinic in over 3-1/2 weeks. We are using Hydrofera Blue I think to the left medial malleolus and silver alginate to the right lateral foot 1/21; right lateral foot still non-viable debris over this area and the left medial malleolus. Both of these vigorously washed off with antiseptic gauze. We have been using silver alginate. 2/4; right lateral foot this looks somewhat better. Unfortunately the area over the left medial malleolus has deteriorated. Using silver alginate in both area 2/18; both wounds arrived today covered in a lot of necrotic debris. We have been using silver alginate. Clearly not making a lot of progress. 3/3; the patient's right second toe is reopened over the PIP. I am not sure why this is happening. He may have microvascular disease. We have checked his macrovascular flow in the past and found it to be normal. He may need an x-ray. The area on the right lateral foot requires debridement. The area on the left lateral  ankle is worse. We have been using PolyMem Ag 08/26/18 on evaluation today patient appears to be doing a little worse in regard to his lower extremities based on what I'm seeing what the nursing staff is telling me. His left medial ankle appears potential to be infected there is your thing on want surrounding the wound. His right second toe is also of concern at this point in my pinion. We have not done x-rays of these areas that may be a good idea to go ahead and proceed with at this point to ensure will not deal with anything such as osteomyelitis. 4/16; it is been a while since I saw this patient. He now has wounds on the right lateral foot, dorsal right second toe, substantially on the left medial malleolus and a small area on the left lateral foot. We have been using PolyMem Silver. Curlex and Coban. He has home health changing the dressing. Since last time he was here he had a culture of the left medial ankle that showed MRSA. He is completed a course/7 days of doxycycline. XRAYS of the left ankle and right foot that were ordered were negative for osteomyelitis 4/30; he arrives in clinic today with some maceration around the wound and a lot of tenderness and erythema in a circumferential area around the right lateral foot. He has wound areas on the right lateral foot dorsal right second toe, largely over the left medial malleolus and a small area over  the left lateral foot 5/7; completed his antibiotics from last time right lateral foot cellulitis is better and the area on his left knee which we discovered after he had been seen by the provider also is improved. He still has extensive wound areas over the right lateral foot, left medial malleolus, right second toe and left lateral foot although all of these look somewhat better with PolyMem 5/15. Wounds on the right lateral foot, right second toe left medial malleolus and the left lateral foot. All of these look somewhat better. We have been  using PolyMem Ag 5/26; the wound on the right lateral foot is almost closed over but he still complains of a lot of pain in this area. He has a eschared area on the right second toe but no open wound.. Also concerning today his original wound on the left medial malleolus looks healthy however just inferior to this almost areas of deep tissue injury and there is an area on the left lateral foot also looks like a deep tissue injury. In the past I have wondered whether he might have cholesterol emboli or some other form of a vasculopathy. I wonder whether this could have something to do with this. 6/2; right lateral foot wound is still open I gave him empiric antibiotics last week without really a lot of change she is still complaining of pain in this area. We x- rayed his feet at some point in April these were negative. It would be difficult to do an MRI. The area on his left medial malleolus looks better. Right second toe is healed. He still has what looks to be a small pressure related area on the left lateral foot but it is not open. We are using PolyMem to all wound which seems to be doing nicely 6/16; right lateral foot wound is still open. The wound looks better although there is still a lot of tenderness around it. Rather than more empiric antibiotics I have cultured this today. We are using PolyMem Ag. On the left lateral malleolus the wound appears better. He has a new area on the left lower anterior pretibial area 6/29; 2-week follow-up. Culture I did of the right lateral foot 2 weeks ago showed MRSA. I gave him 2 weeks of Septra which he should be just about finishing now and this seems better. We got a call from home health last week to report swelling in the left leg because we were listed as his primary. We referred him back to primary care. He arrives today with intense erythema and tenderness around the 2 wounds on the left medial ankle on the left lower calf. The area on the right  lateral foot looks better 7/7; I brought him back this week to follow-up on his left foot which had cellulitis last week. I gave him clindamycin which as far as I know he tolerated. The erythema is better. He has a 3 current wounds one on the right lateral foot a small area on the right lateral malleolus and the more recent area on the left anterior pretibial area. 7/21; bilateral distal lower leg and foot wounds. The area on his right lateral foot looks a lot better. He has an area on his left pretibial area distally. The area on the medial malleolus on the left which is 1 of his worst wounds has closed over. He does not appear to have any open areas in his remaining toes 8/4-Patient returns at 2 weeks, he has the remaining wound on  the left lower leg the other 2 wounds have healed. Patient is continued to be treated with silver alginate with a 2 layer wrap on the left 8/18; I been following this patient for a long period for bilateral lower extremity wounds. 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 His wounds are probably pressure although he religiously wears thick bunny boots. He does not have macrovascular disease but I did formal testing on him perhaps 2 or 3 years ago. I have often wondered about either cholesterol emboli or a vasculopathy of some sort given the distal nature of these variable locations etc. He has 2 areas that we have been following one on the left medial lower leg and one on the right lateral foot. Both of these are mostly healed today there is some eschar over sections of both areas although I did not disturb this. 03/31/2019 on evaluation today patient appears to be doing a little bit more poorly in regard to the sacral region. He has 2 small wounds noted at this point. Fortunately there is no signs of active infection at this time. No fevers, chills, nausea, vomiting, or diarrhea. He is having some pain when he sits on  his gluteal region a big part of the reason he has these wounds is likely due to the fact that his wheelchair has not been functioning properly. He does still have a couple areas of deep tissue injury on his lower extremities he does need new Prevalon offloading boots as well. 04/28/2019 on evaluation today patient appears to be doing well with regard to his wounds in particular. His ankle area has fairly small based on what I am seeing currently and his sacral region actually is still open but does not appear to be doing too poorly in my opinion. Fortunately there is no signs of active infection at this time. He was in the hospital unfortunately and this was from 04/08/2019 through 04/12/2019 secondary to persistent diarrhea, dehydration and acute kidney injury. With that being said fortunately the patient does not seem to be doing too poorly at this time which is good news. He is having pain mainly in the sacral region. 06/23/2019 on evaluation today patient appears to be doing well with regard to his sacral region which in fact is completely healed. Unfortunately he has an area of rubbing/pressure on his right lateral chest/back region. This is where it is rubbing up on his chair. Subsequently he also has an open wound on the left medial malleolus which is not very open but starting to crack and then on the right lateral foot this is much more open at this point. Unfortunately there is no signs of significant improvement in regard to these areas and in fact there does appear to be evidence of active infection quite significantly. No fevers, chills, nausea, vomiting, or diarrhea. The patient did test positive for COVID-19 that has been greater than 14 days ago although they did not know the exact timing. That is why he was not here for his last appointment. 07/07/2019 upon evaluation today patient actually appears to be doing quite well in regard to his left ankle as well as the chest/back region which  is doing great in fact appears to be healed in my opinion. He still has an issue on his lateral right foot that is open and appears to be infected but does have me more concerned. In fact I do believe the doxycycline helped in general for the  cellulitis but I believe that may have been more MRSA related based on the culture that we did. What I am seeing right now has more of the blue-green drainage and may be more consistent with a Pseudomonas that I was hoping was not really causing much of an issue but I think it may be at this point on the right lateral foot. For that reason I do think treatment would be a good idea of the reason I avoided the Cipro before was the potential for QT prolongation which could obviously cause problems with the patient with results of interaction with respiratory wound. The tizanidine also had some interactions as well. Nonetheless I think at this point that it would be a possibility for [place] to use topical gentamicin to see if this could be of benefit for the patient underneath the silver alginate dressing. 07/21/2019 upon evaluation today patient appears to be doing fairly well at this point in regard to his lower extremity on the right. He does have a wound on his right lateral foot as well as the right fifth toe and the right fourth toe. Unfortunately he continues to experience significant issues with pressure and trauma to some degree and I think this is just due to the fact that again he is not really not able to control his legs and what is happening here. Fortunately there is no signs of active infection at this time. 08/04/2019 upon evaluation today patient actually appears to be doing quite well with regard to his toe ulcers I am very pleased in that regard. Unfortunately the foot ulcer is showing some signs of erythema his primary care provider has placed him on clindamycin at this point. Again I do believe this can be beneficial for him. With that being said I do  think the patient may benefit from a light Kerlix and Coban wrap to help with some of his edema at this point. 08/18/2019 upon evaluation today patient appears to be doing still poorly in regard to his lateral foot ulcer. This is not very deep but there is some concern about the possibility of osteomyelitis. I think it would be appropriate for [place] to go ahead and have an x-ray at this time. The patient is currently on clindamycin which from his culture which was performed on 06/23/2019 it does appear that he would benefit from that in regard to the MRSA. However Pseudomonas was also identified then and the patient actually has some blue-green drainage at this point as well. I am much more concerned about the possibility of the Pseudomonas being an infection is causative agent at this point that I am the MRSA. Subsequently Cipro the patient was sensitive to but we never use that is stable use a topical gent due to the fact that unfortunately there was some interaction between the Cipro and the patient's risk for down potentially causing QT prolongation. 08/31/2019; have not seen this patient in quite a period of time. He has a substantial wound on his right lateral foot we have been using gentamicin ointment with Hydrofera Blue over the top. From what I am able to determine he is also on antibiotics perhaps clindamycin although I have not had a chance to look over this. He also has an eschared area on the right fifth toe as well as the right second toe. 4/20; right lateral foot wound. He comes in today with intense erythema and tenderness around this wound. The wound itself has a surface on it but it doesn't  look particularly healthy a lot of gelatinous debris. There is no palpable bone. We've been using gentamicin ointment and Hydrofera Blue. Since the patient was last here he was seen by Dr. Algis Liming of infectious disease. He ordered an MRI that I think is due later this month on 4/30. He apparently also  checked a sedimentation rate and C-reactive protein metabolic panel CBC with differential. At the time he was seen he wanted to withhold any microbials pending identification of the depth of the infection although looking at this I don't know that I'm going to be able to hold off for another 10 days. 4/29; he went on to have the MRI that was ordered by Dr. Algis Liming of infectious disease. 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 I had given him cefdinir last week for surrounding cellulitis he is finishing this today. I have communicated with Dr. Algis Liming who wants bone for pathology and culture before proceeding with consideration of IV antibiotics. Fortunately he is due to see Dr. Samuella Cota of podiatry today I am hopeful that the bone biopsy and culture will be considered there 5/4 as I understand things Dr. Samuella Cota actually wants to remove the diseased proximal fifth metatarsal. Looking back at this decision is probably not a bad one. He has good blood flow he is nonambulatory they are apparently going to do this next week. Paradoxically the wound on the lateral part of his foot is actually a lot better. The cellulitis that was so problematic 2 weeks ago also has resolved. Electronic Signature(s) Signed: 09/28/2019 5:25:34 PM By: Baltazar Najjar MD Entered By: Baltazar Najjar on 09/28/2019 12:35:31 -------------------------------------------------------------------------------- Physical Exam Details Patient Name: Date of Service: [name], [name] 09/28/2019 10:45 A M Medical Record Number: [id number] Patient Account Number: [id number] Date of Birth/Sex: Treating RN: [birth year] (58 y.o. [name] Primary Care Provider: Orpah Cobb Other Clinician: Referring Provider: Treating Provider/Extender: Candiss Norse in Treatment: 115 Constitutional Sitting or standing Blood Pressure is within target range for patient.. Pulse regular and  within target range for patient.Marland Kitchen Respirations regular, non-labored and within target range.. Temperature is normal and within the target range for the patient.Marland Kitchen Appears in no distress. Respiratory work of breathing is normal. Cardiovascular Popliteal pulses easily palpable. Pedal pulses are really quite vibrant. Integumentary (Hair, Skin) Erythema on the right lateral foot is a lot better. Psychiatric appears at normal baseline. Notes Wound exam; right lateral foot wound continues to look improved. Healthy surface the erythema has resolved. He has small eschared areas on the right fifth toe. Electronic Signature(s) Signed: 09/28/2019 5:25:34 PM By: Baltazar Najjar MD Entered By: Baltazar Najjar on 09/28/2019 12:37:50 -------------------------------------------------------------------------------- Physician Orders Details Patient Name: Date of Service: BESSIE, LIVINGOOD 09/28/2019 10:45 A M Medical Record Number: [id number] Patient Account Number: [id number] Date of Birth/Sex: Treating RN: 11-06-1960 (58 y.o. [name] Primary Care Provider: Orpah Cobb Other Clinician: Referring Provider: Treating Provider/Extender: Candiss Norse in Treatment: 115 Verbal / Phone Orders: No Diagnosis Coding ICD-10 Coding Code Description [phone] Pressure ulcer of other site, stage 2 L03.115 Cellulitis of right lower limb L89.893 Pressure ulcer of other site, stage 3 I73.89 Other specified peripheral vascular diseases Follow-up Appointments Return Appointment in 2 weeks. Dressing Change Frequency Wound #37 Right,Lateral Foot Change dressing every day. Wound #38 Right T Fifth oe Change dressing every day. Wound Cleansing Wound #37 Right,Lateral Foot Clean wound with Wound Cleanser Wound #38 Right T Fifth oe Clean wound with Wound  Cleanser Primary Wound Dressing Wound #37 Right,Lateral Foot Calcium Alginate with Silver Wound #38 Right T  Fifth oe Calcium Alginate with Silver Secondary Dressing Wound #37 Right,Lateral Foot Kerlix/Rolled Gauze Dry Gauze Wound #38 Right T Fifth oe Kerlix/Rolled Gauze Dry Gauze Edema Control Kerlix and Coban - Right Lower Extremity Off-Loading Multipodus Splint to: - sage boots or equivalent at all times Turn and reposition every 2 hours Additional Orders / Instructions Other: - discontinue Gentamycin ointment. discontinue oral Cefdinir antibiotic. Home Health Continue Home Health skilled nursing for wound care. Beverly Gust Electronic Signature(s) Signed: 09/28/2019 5:25:34 PM By: Baltazar Najjar MD Signed: 09/29/2019 4:37:47 PM By: [name] RN Signed: 09/29/2019 4:37:47 PM By: [name] RN Entered By: [name] on 09/28/2019 11:55:50 -------------------------------------------------------------------------------- Problem List Details Patient Name: Date of Service: CORNELIO, PARKERSON 09/28/2019 10:45 A M Medical Record Number: [id number] Patient Account Number: [id number] Date of Birth/Sex: Treating RN: 06/17/1960 (58 y.o. [name] Primary Care Provider: Orpah Cobb Other Clinician: Referring Provider: Treating Provider/Extender: Candiss Norse in Treatment: 115 Active Problems ICD-10 Encounter Code Description Active Date MDM Diagnosis [phone] Pressure ulcer of other site, stage 2 06/23/2019 No Yes L03.115 Cellulitis of right lower limb 12/25/2017 No Yes L89.893 Pressure ulcer of other site, stage 3 06/23/2019 No Yes I73.89 Other specified peripheral vascular diseases 08/26/2018 No Yes Inactive Problems ICD-10 Code Description Active Date Inactive Date 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 breakdown of skin 07/14/2017 07/14/2017 L89.522 Pressure ulcer of left ankle, stage 2 04/28/2019 04/28/2019 L03.116 Cellulitis of left lower limb 08/26/2018 08/26/2018 L89.312 Pressure ulcer of right buttock, stage 2  12/25/2017 12/25/2017 L89.152 Pressure ulcer of sacral region, stage 2 12/25/2017 12/25/2017 L89.322 Pressure ulcer of left buttock, stage 2 12/25/2017 12/25/2017 L03.116 Cellulitis of left lower limb 11/23/2018 11/23/2018 Resolved Problems ICD-10 Code Description Active Date Resolved Date L89.152 Pressure ulcer of sacral region, stage 2 03/31/2019 03/31/2019 L97.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 layer exposed 12/25/2017 12/25/2017 L97.322 Non-pressure chronic ulcer of left 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 05/01/2018 05/01/2018 L97.521 Non-pressure chronic ulcer of other part of left foot limited to breakdown of skin 11/10/2018 11/10/2018 Electronic Signature(s) Signed: 09/28/2019 5:25:34 PM By: Baltazar Najjar MD Entered By: Baltazar Najjar on 09/28/2019 12:29:55 -------------------------------------------------------------------------------- Progress Note Details Patient Name: Date of Service: ZAYQUAN, BOGARD 09/28/2019 10:45 A M Medical Record Number: [id number] Patient Account Number: [id number] Date of Birth/Sex: Treating RN: 10/03/1960 (58 y.o. [name] Primary Care Provider: Orpah Cobb Other Clinician: Referring Provider: Treating Provider/Extender: Candiss Norse in Treatment: 115 Subjective History of Present Illness (HPI) 11/23/15; this is a patient I remember from a previous stay in this clinic in 2012. The patient says this was for a wound in this same area as currently although I have no information to verify that. Most of the information is provided by his caregiver from the group home where he resides. Apparently he has had a wound in this area for 8 months. He also has erythema around this area and has had multiple rounds of antibiotics apparently with some improvement but the erythema is persists. He apparently had a fracture in the ankle 2 months ago and was seen by Dr. Lajoyce Corners . He apparently had a splint applied and more recently  a heel offloading boot. He had home health coming out to a week ago not clear what they are dressing this with but they apparently discharged  him perhaps because of one of Dr. 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 He has a Nurse, adult for transferring at home he is nonambulatory and does not wear footwear. He is not a diabetic. Looking through [place] [facility] he appears to have fairly active primary care. The area on the ankle is variably labeled as a pressure area and/or chronic venous insufficiency. He had a recent venous ultrasound on 5/25 that did not show a DVT in the left leg however this was not a reflux study. There was no superficial DVT either. Arterial studies on 10/15/14 showed a left ABI of 1.16 Center right of 1.12 waveforms were triphasic he does not have significant arterial disease. He has had recent x-rays of the left foot and ankle. The left foot did not show any abnormality. Left ankle x-ray showed a spiral fracture of the left tibial diaphysis without evidence of osteomyelitis. 11/30/15 culture I did last week showed pseudomonas I changed him to oral ciprofloxacin. Erythema is a lot better. 12/07/15 area around the wound shows considerable improvement of the periwound erythema. 12/14/15; the patient has a improvement of the periwound erythema which in retrospect I think with cellulitis successfully treated. We have been using Iodoflex started last week 12/25/15 wound on the left medial malleolus and dorsal left foot. Theraskin #1 applied READMISSION 02/27/16; the patient was admitted to hospital from 8/14 through 01/18/16 initially felt to have cellulitis of his left lower leg however he was noted to have significant persistent pain and swelling. A duplex ultrasound was ordered that was negative. Obie Dredge an x-ray was done that showed a spiral fracture of the left leg. With posterior displacement and angulation at the mid left tibia. Orthopedics  saw the patient and splinted the leg. He was then sent to [facility] skilled facility and only return to his group home last week. He has wounds on the left medial malleolus, left dorsal foot and a worrisome area on the plantar left heel which is not really open but may represent a hematoma or a DTI 03/06/16; areas of left anterior and medial ankle both look improved. There are 3 small wounds here. He has a area over the dorsal left first metatarsal head which is not open. In meantime his left heel has opened 03/14/16 the medial left ankle wounds looks stable to improved. The area on the posterior left heel has opened up and had a very adherent eschar and slept over this 03/21/16 patient has 3 wounds on his left medial ankle is stable area on his left dorsal first metatarsal head appears to be improved. Continued problems with adherent eschar over the left heel 03/28/16; patient has 3 wounds on his left medial ankle and lower leg all of these appear to be stable. The problem continues to be the left heel and the adherent eschar. This may have been a pressure ulcer from her brace at one point. He has a English as a second language teacher we have been using [employer] and all wound areas. Dressings are being changed by home health 04/04/16; the patient's areas on the medial aspect of the left ankle looks superficial and appear to be gradually closing. The area on the plantar aspects/Achilles aspect of his left heel as surface slough and nonviable subcutaneous tissue requiring debridement. 04/11/16; the areas on the patient's medial left ankle appear to be superficial and appear to be closing. The area on the plantar heel on the left has surface slough and nonviable subcutaneous tissue requiring  debridement. I don't think this is too much different from last week there is also undermining 04/25/16; patient is followed in his group home by [facility] home health. The area on the plantar heel is smaller but with still with some  depth. We're using Iodoflex to this. The areas on the left ankle looks healthy but dry and somewhat larger. Cause of this is not really clear using Prisma 05/02/16; areas x3 all look improved expecially the areas on the medial ankle. Heel wound is smaller but still deep 05/09/16; all areas o3 look improved on the medial ankle. Heel wound is also contracting. 12/0.9/17 the areas on his left medial ankle are all closed. I think these are largely chronic venous insufficiency however there is no edema. The area on his left heel was a pressure area. READMISSION 08/02/16; this man is a patient that we have had in this clinic on several times before. I believe he has cerebral palsy is nonambulatory. He has chronic venous insufficiency with venous inflammation. He was last year in the fall of 2017 with areas on his left medial ankle left heel and left dorsal foot. He arrives today with once again a vague history. As mentioned he is nonambulatory he spends most of his day in a pressure offloading boot nevertheless they noted wounds in this area on the medial left ankle 2 weeks ago. There is any other history here I am uncertain 08/15/16; the patient's wounds on his medial ankle actually look better however the area on the left lateral/dorsal foot is covered in black necrotic eschar. Previous formal arterial studies did not show significant arterial disease with normal ABIs and triphasic waveforms. He is immobilized and at risk for pressure although he seems aware protective boots at all times as far as I am aware. 08/22/16; in general his wounds look better. All the wounds on his medial left ankle look improved the area on his lateral foot however continues to be problematic. Patient comes in the clinic today in a new wheelchair he is quite delighted 09/05/16; a wound on the left medial Calf and left dorsal foot are healed. The area on the left lateral foot is improved however the area on the left  medial malleolus is inflamed with a considerable degree of surrounding erythema. This looks like cellulitis. ABIs and clinical exam are within normal limits 09/12/16; the patient continues to have a wound on the left medial calf. I was concerned about cellulitis in this area last week and gave him a course of doxycycline. The wound looks better here. The area on the left lateral foot however is unchanged 09/20/16; the patient came in this week early after traumatizing his toe on the left foot on a table while moving around in his motorized wheelchair. 09/26/16- he is here for follow-up evaluation of his left foot and malleolus ulcers. Since his appointment he abraded his left medial hallux against a table while in his motorized wheelchair, apparently trying to pull up to the table for a meal. He continues to complain of tenderness to the left lateral foot, surrounding the current ulcer 10/07/16; left foot and malleolus ulcers. 10/14/16; the patient is doing well. He has a wound on the left lateral, left medial malleolus and atraumatic wound on the left great toe. Although these appear to be superficial and closing 10/31/16; the patient is doing well. The area on the left lateral foot, left medial malleolus and the left great toe or all epithelialized. He has home health 11/14/16; patient  arrives today with the area on the left great toe healed. The left lateral foot however in the left medial malleolus and the ankle superficial wounds that are slightly larger. He has erythema and tenderness around the area on the left lateral foot. As well he is complaining of dysuria. His caregiver notes that when he gets like this he is more lethargic which indeed seems to be the case today 11/21/16; left great toe is still healed. His wound on the left lateral foot appears healthy and slightly smaller also the area on the left medial ankle. This had black eschar on it which I removed. The remaining wound looks  satisfactory 12/12/16; patient hasn't been here in a number of weeks. The area on the left lateral foot is healed however on his medial ankle has 2 open areas one of them covered with a necrotic eschar. He also has a new wound on the right second toe which was apparently trauma induced while he was at a movie 12/27/16; the patient has wounds on his left medial mallelus and left lateral foot that are just about closed. The area on the right second toe which was trauma from last time is still open but again everything looks a lot better here. 01/30/17; patient arrives in clinic today and is really been a long time since I've seen him. The wounds are all epithelialized on the left foot this includes his left lateral foot, left medial malleolus. Apparently was in the ER for what of I think was a fungal infection on his buttock's he was prescribed Diflucan and doxycycline I think this is already getting better. READMISSION T is a now 59 year old man who has advanced cerebral palsy and functional quadriplegia. We have had him in this clinic multiple times before for wounds erry on his right ankle, left ankle, left dorsal foot and most recently from 08/02/16 through 01/30/17 with his left medial ankle and left foot and finally right second toe. His attendant from the group home where he lives is very knowledgeable about him. She states that they noticed breakdown in his skin in his feet sometime in January although they could not get an appointment until today in this clinic. The previous wounds have always been felt to be secondary to incidental trauma/pressure areas or possibly some degree of chronic venous insufficiency. He had normal arterial studies in may of 2016. He has definite open wounds on the right lateral foot over the base of the fifth metatarsal, the right second toe DIP, the left medial malleolus. He has concerning areas over the left lateral fifth metatarsal base, retaform purpura over the medial  left ankle and perhaps the medial left first toe. 07/21/17; culture we did last week from the base of the right fifth metatarsal grew group G strep. This should've been sensitive to the doxycycline I gave him. The other major wounds are on the right second toe DIP, left medial malleolus, base of the left lateral fifth metatarsal and the medial left ankle and medial left toe. 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 We have macrovascular arterial studies scheduled for Thursday although I don't think this is what the problem is. 07/28/17; patient arrives with really no improvement. He has a wound on the right second toe, lateral right foot, right medial malleolus which was new this week, left medial malleolus, but looked like a DTI on the left great toe. His macrovascular studies are on 07/30/17 although I would be surprised if this provides all the explanation.  He has small areas even on his right second and third toe which look like splinter-type hemorrhages. The question I have is where is this coming from. I'd like to see the vascular studies however before we pursue this. He has had a DVT in the past but is no longer on anticoagulants. I'll need to review his history 08/04/17; the patient had his ARTERIAL STUDIES this showed a right ABI of 1.16 and the left ABI of 1.20. TBI on the left at 0.84. He is at a right first toe amputation. The studies were unchanged from studies in May 2016. It was not felt that he had significant lower extremity arterial disease In the meantime we are using silver alginate to the wounds on the right second toe right lateral foot left medial malleolus. I'm going to change to Silver collagen today. 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 Working him up for this would include an echocardiogram probably a CT scan of the chest and abdomen  to look at the major thoracic and abdominal aortic vessels. I am not going to involve myself in this unless there is more symptoms/signs 08/18/17; the patient arrives today with wounds looking somewhat better. The remaining areas are the right second toe dorsal, right lateral foot and left medial malleolus which only has a small open area. We've been using silver collagen 09/01/17;right second toe dorsally probes to bone today. This is a deterioration. We've been using silver collagen. Only a small open area remains on the left medial malleolus. 09/09/17; right second toe appears better. There is no probing bone. He has a small area remaining on the left medial malleolus, the right lateral foot. We have been using silver collagen 10/07/17; the patient returns to clinic today after a month hiatus. He has wounds over the right second toe, right lateral foot and the left medial malleolus. We have been using silver collagen. He has home health. 10/24/17; patient has open wounds over the right second toe, now the left fourth toe and a superficial area over his left buttock. Some of the other areas have healed including the left medial malleolus and right lateral foot. Using silver collagen on these wounds 11/07/17; the patient has closed his wounds on the right second toe and left fourth toe although they look all normal. Last time he was here he had a superficial area over the left buttock this is also closed. The right lateral foot wound I had closed out last time although it is seems to have reopened today. 628/19 the areas over the right second and left fourth toe remains closed. The right lateral foot has tightly adherent necrotic surface over. We have been using silver collagen he offloads this and a bunny boot 12/12/17; the patient has reopened the recurrent area over the right second PIP joint. 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 He does not  have obvious macrovascular disease. The area over the right lateral foot looks about the same as last time tightly adherent necrotic debris. We've been using Hydrofera Blue 12/25/17; 2 week follow-up. The area over the right second PIP is closed however the area on the right lateral foot is worse with surrounding erythema and pain compatible with cellulitis is going to need an antibiotic. ooAS WELL..... We have looked at his buttocks and lower back. He has 3  stage II ulcers. The exact history here is unclear however he is very disabled man with cerebral palsy. He goes to a day program from the group home in which she lives 5 days a week and I think he is on his buttock all day on these visits. Surrounding the wounds see has extensive stage I injury as well. ooFINALLY it looks as though he has lost weight. I'm not the only staff member that is recognized this 01/08/18 on evaluation today patient appears to be doing better in regard to his gluteal wounds. In fact everything appears to be healed back here although very dry. He has a couple open sores on the scrotal region other than this it is really his foot that is still giving him the most trouble. He did complete the Keflex unfortunately it seems like there's still a lot of erythema in this in fact looks worse than it did previous. I'm gonna culture this today and likely start with a different antibiotic. 01/16/18; I was reassured that the wounds on his buttocks and sacrum are healed. I did not look at these the day. The culture that was done last week on 01/11/18 of the right lateral foot wounds showed Pseudomonas. Bactrim is not a good choice for this. Ciprofloxacin interacts with his muscle relaxants therefore I prescribed cefdinir 300 twice a day for 7 days. Bactrim coverage of pseudomonas is not reliable although the degree of erythema looks a lot better 01/30/18; the patient has completed his antibiotics. The area does not have infection on the  right lateral foot. The wounds look a lot better. We've been using silver alginate 02/12/18; there is on the right lateral foot. Copious amounts of surface eschar. Also similarly over the PIP of the right second toe. We've been using silver alginate to date 03/02/2018; the area that remains is on the right lateral foot. The right second toe is healed. 03/16/2018; the area there is original wound on the right lateral foot. Still requiring debridement. We are using Hydrofera Blue. The right second toe is still healed/closed 03/30/2018; right lateral foot there is 2 small open areas remaining. They actually look quite healthy we have been using Hydrofera Blue the right second toe remains closed 04/13/2018; right lateral foot. 1 of the 2 small open areas from last time has closed over. The other had surface slough that I removed with a #3 curette. This looks quite healthy. The right second toe remains closed. We are using Hydrofera Blue 05/01/2018; right lateral foot very superficial areas that look like they are trying to close over. He had a new threatened area on the left medial ankle just below the medial malleolus. I think these represent chronic venous insufficiency areas 05/14/2018; the right right lateral foot looks like it closed over as was predicted last time however he has some dark areas subcutaneously erythema superiorly and this is really very tender. This is either coexistent cellulitis or perhaps a deep tissue injury. The area on the left medial ankle is superficial but has some size. We have been using Hydrofera Blue to both areas. The patient complains of pain in his right foot 06/09/2018; right lateral foot was closed over last time but there was coexistent cellulitis around this. I gave him Levaquin. He also has an area on the left medial malleolus. He has not been back to clinic in over 3-1/2 weeks. We are using Hydrofera Blue I think to the left medial malleolus and silver alginate to  the right lateral  foot 1/21; right lateral foot still non-viable debris over this area and the left medial malleolus. Both of these vigorously washed off with antiseptic gauze. We have been using silver alginate. 2/4; right lateral foot this looks somewhat better. Unfortunately the area over the left medial malleolus has deteriorated. Using silver alginate in both area 2/18; both wounds arrived today covered in a lot of necrotic debris. We have been using silver alginate. Clearly not making a lot of progress. 3/3; the patient's right second toe is reopened over the PIP. I am not sure why this is happening. He may have microvascular disease. We have checked his macrovascular flow in the past and found it to be normal. He may need an x-ray. The area on the right lateral foot requires debridement. The area on the left lateral ankle is worse. We have been using PolyMem Ag 08/26/18 on evaluation today patient appears to be doing a little worse in regard to his lower extremities based on what I'm seeing what the nursing staff is telling me. His left medial ankle appears potential to be infected there is your thing on want surrounding the wound. His right second toe is also of concern at this point in my pinion. We have not done x-rays of these areas that may be a good idea to go ahead and proceed with at this point to ensure will not deal with anything such as osteomyelitis. 4/16; it is been a while since I saw this patient. He now has wounds on the right lateral foot, dorsal right second toe, substantially on the left medial malleolus and a small area on the left lateral foot. We have been using PolyMem Silver. Curlex and Coban. He has home health changing the dressing. Since last time he was here he had a culture of the left medial ankle that showed MRSA. He is completed a course/7 days of doxycycline. XRAYS of the left ankle and right foot that were ordered were negative for osteomyelitis 4/30; he  arrives in clinic today with some maceration around the wound and a lot of tenderness and erythema in a circumferential area around the right lateral foot. He has wound areas on the right lateral foot dorsal right second toe, largely over the left medial malleolus and a small area over the left lateral foot 5/7; completed his antibiotics from last time right lateral foot cellulitis is better and the area on his left knee which we discovered after he had been seen by the provider also is improved. He still has extensive wound areas over the right lateral foot, left medial malleolus, right second toe and left lateral foot although all of these look somewhat better with PolyMem 5/15. Wounds on the right lateral foot, right second toe left medial malleolus and the left lateral foot. All of these look somewhat better. We have been using PolyMem Ag 5/26; the wound on the right lateral foot is almost closed over but he still complains of a lot of pain in this area. He has a eschared area on the right second toe but no open wound.Geoffry Paradise concerning today his original wound on the left medial malleolus looks healthy however just inferior to this almost areas of deep tissue injury and there is an area on the left lateral foot also looks like a deep tissue injury. In the past I have wondered whether he might have cholesterol emboli or some other form of a vasculopathy. I wonder whether this could have something to do with this. 6/2;  right lateral foot wound is still open I gave him empiric antibiotics last week without really a lot of change she is still complaining of pain in this area. We x- rayed his feet at some point in April these were negative. It would be difficult to do an MRI. The area on his left medial malleolus looks better. Right second toe is healed. He still has what looks to be a small pressure related area on the left lateral foot but it is not open. We are using PolyMem to all wound which  seems to be doing nicely 6/16; right lateral foot wound is still open. The wound looks better although there is still a lot of tenderness around it. Rather than more empiric antibiotics I have cultured this today. We are using PolyMem Ag. On the left lateral malleolus the wound appears better. He has a new area on the left lower anterior pretibial area 6/29; 2-week follow-up. Culture I did of the right lateral foot 2 weeks ago showed MRSA. I gave him 2 weeks of Septra which he should be just about finishing now and this seems better. We got a call from home health last week to report swelling in the left leg because we were listed as his primary. We referred him back to primary care. He arrives today with intense erythema and tenderness around the 2 wounds on the left medial ankle on the left lower calf. The area on the right lateral foot looks better 7/7; I brought him back this week to follow-up on his left foot which had cellulitis last week. I gave him clindamycin which as far as I know he tolerated. The erythema is better. He has a 3 current wounds one on the right lateral foot a small area on the right lateral malleolus and the more recent area on the left anterior pretibial area. 7/21; bilateral distal lower leg and foot wounds. The area on his right lateral foot looks a lot better. He has an area on his left pretibial area distally. The area on the medial malleolus on the left which is 1 of his worst wounds has closed over. He does not appear to have any open areas in his remaining toes 8/4-Patient returns at 2 weeks, he has the remaining wound on the left lower leg the other 2 wounds have healed. Patient is continued to be treated with silver alginate with a 2 layer wrap on the left 8/18; I been following this patient for a long period for bilateral lower extremity wounds. 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 His wounds are probably  pressure although he religiously wears thick bunny boots. He does not have macrovascular disease but I did formal testing on him perhaps 2 or 3 years ago. I have often wondered about either cholesterol emboli or a vasculopathy of some sort given the distal nature of these variable locations etc. He has 2 areas that we have been following one on the left medial lower leg and one on the right lateral foot. Both of these are mostly healed today there is some eschar over sections of both areas although I did not disturb this. 03/31/2019 on evaluation today patient appears to be doing a little bit more poorly in regard to the sacral region. He has 2 small wounds noted at this point. Fortunately there is no signs of active infection at this time. No fevers, chills, nausea, vomiting, or diarrhea. He is having some pain when  he sits on his gluteal region a big part of the reason he has these wounds is likely due to the fact that his wheelchair has not been functioning properly. He does still have a couple areas of deep tissue injury on his lower extremities he does need new Prevalon offloading boots as well. 04/28/2019 on evaluation today patient appears to be doing well with regard to his wounds in particular. His ankle area has fairly small based on what I am seeing currently and his sacral region actually is still open but does not appear to be doing too poorly in my opinion. Fortunately there is no signs of active infection at this time. He was in the hospital unfortunately and this was from 04/08/2019 through 04/12/2019 secondary to persistent diarrhea, dehydration and acute kidney injury. With that being said fortunately the patient does not seem to be doing too poorly at this time which is good news. He is having pain mainly in the sacral region. 06/23/2019 on evaluation today patient appears to be doing well with regard to his sacral region which in fact is completely healed. Unfortunately he has an  area of rubbing/pressure on his right lateral chest/back region. This is where it is rubbing up on his chair. Subsequently he also has an open wound on the left medial malleolus which is not very open but starting to crack and then on the right lateral foot this is much more open at this point. Unfortunately there is no signs of significant improvement in regard to these areas and in fact there does appear to be evidence of active infection quite significantly. No fevers, chills, nausea, vomiting, or diarrhea. The patient did test positive for COVID-19 that has been greater than 14 days ago although they did not know the exact timing. That is why he was not here for his last appointment. 07/07/2019 upon evaluation today patient actually appears to be doing quite well in regard to his left ankle as well as the chest/back region which is doing great in fact appears to be healed in my opinion. He still has an issue on his lateral right foot that is open and appears to be infected but does have me more concerned. In fact I do believe the doxycycline helped in general for the cellulitis but I believe that may have been more MRSA related based on the culture that we did. What I am seeing right now has more of the blue-green drainage and may be more consistent with a Pseudomonas that I was hoping was not really causing much of an issue but I think it may be at this point on the right lateral foot. For that reason I do think treatment would be a good idea of the reason I avoided the Cipro before was the potential for QT prolongation which could obviously cause problems with the patient with results of interaction with respiratory wound. The tizanidine also had some interactions as well. Nonetheless I think at this point that it would be a possibility for [place] to use topical gentamicin to see if this could be of benefit for the patient underneath the silver alginate dressing. 07/21/2019 upon evaluation today  patient appears to be doing fairly well at this point in regard to his lower extremity on the right. He does have a wound on his right lateral foot as well as the right fifth toe and the right fourth toe. Unfortunately he continues to experience significant issues with pressure and trauma to some degree and I  think this is just due to the fact that again he is not really not able to control his legs and what is happening here. Fortunately there is no signs of active infection at this time. 08/04/2019 upon evaluation today patient actually appears to be doing quite well with regard to his toe ulcers I am very pleased in that regard. Unfortunately the foot ulcer is showing some signs of erythema his primary care provider has placed him on clindamycin at this point. Again I do believe this can be beneficial for him. With that being said I do think the patient may benefit from a light Kerlix and Coban wrap to help with some of his edema at this point. 08/18/2019 upon evaluation today patient appears to be doing still poorly in regard to his lateral foot ulcer. This is not very deep but there is some concern about the possibility of osteomyelitis. I think it would be appropriate for [place] to go ahead and have an x-ray at this time. The patient is currently on clindamycin which from his culture which was performed on 06/23/2019 it does appear that he would benefit from that in regard to the MRSA. However Pseudomonas was also identified then and the patient actually has some blue-green drainage at this point as well. I am much more concerned about the possibility of the Pseudomonas being an infection is causative agent at this point that I am the MRSA. Subsequently Cipro the patient was sensitive to but we never use that is stable use a topical gent due to the fact that unfortunately there was some interaction between the Cipro and the patient's risk for down potentially causing QT prolongation. 08/31/2019; have not  seen this patient in quite a period of time. He has a substantial wound on his right lateral foot we have been using gentamicin ointment with Hydrofera Blue over the top. From what I am able to determine he is also on antibiotics perhaps clindamycin although I have not had a chance to look over this. He also has an eschared area on the right fifth toe as well as the right second toe. 4/20; right lateral foot wound. He comes in today with intense erythema and tenderness around this wound. The wound itself has a surface on it but it doesn't look particularly healthy a lot of gelatinous debris. There is no palpable bone. We've been using gentamicin ointment and Hydrofera Blue. Since the patient was last here he was seen by Dr. Algis Liming of infectious disease. He ordered an MRI that I think is due later this month on 4/30. He apparently also checked a sedimentation rate and C-reactive protein metabolic panel CBC with differential. At the time he was seen he wanted to withhold any microbials pending identification of the depth of the infection although looking at this I don't know that I'm going to be able to hold off for another 10 days. 4/29; he went on to have the MRI that was ordered by Dr. Algis Liming of infectious disease. 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 I had given him cefdinir last week for surrounding cellulitis he is finishing this today. I have communicated with Dr. Algis Liming who wants bone for pathology and culture before proceeding with consideration of IV antibiotics. Fortunately he is due to see Dr. Samuella Cota of podiatry today I am hopeful that the bone biopsy and culture will be considered there 5/4 as I understand things Dr. Samuella Cota actually wants to remove the diseased proximal fifth  metatarsal. Looking back at this decision is probably not a bad one. He has good blood flow he is nonambulatory they are apparently going to do this next week. Paradoxically  the wound on the lateral part of his foot is actually a lot better. The cellulitis that was so problematic 2 weeks ago also has resolved. Objective Constitutional Sitting or standing Blood Pressure is within target range for patient.. Pulse regular and within target range for patient.Marland Kitchen. Respirations regular, non-labored and within target range.. Temperature is normal and within the target range for the patient.Marland Kitchen. Appears in no distress. Vitals Time Taken: 11:12 AM, Height: 73 in, Weight: 178 lbs, BMI: 23.5, Temperature: 98.0 F, Pulse: 70 bpm, Respiratory Rate: 16 breaths/min, Blood Pressure: 125/80 mmHg. Respiratory work of breathing is normal. Cardiovascular Popliteal pulses easily palpable. Pedal pulses are really quite vibrant. Psychiatric appears at normal baseline. General Notes: Wound exam; right lateral foot wound continues to look improved. Healthy surface the erythema has resolved. He has small eschared areas on the right fifth toe. Integumentary (Hair, Skin) Erythema on the right lateral foot is a lot better. Wound #37 status is Open. Original cause of wound was Pressure Injury. The wound is located on the Right,Lateral Foot. The wound measures 3.6cm length x 2.4cm width x 0.1cm depth; 6.786cm^2 area and 0.679cm^3 volume. There is Fat Layer (Subcutaneous Tissue) Exposed exposed. There is no tunneling or undermining noted. There is a medium amount of serous drainage noted. The wound margin is flat and intact. There is medium (34-66%) pink granulation within the wound bed. There is a medium (34-66%) amount of necrotic tissue within the wound bed including Adherent Slough. Wound #38 status is Open. Original cause of wound was Shear/Friction. The wound is located on the Right T Fifth. The wound measures 0.3cm length x oe 0.5cm width x 0.1cm depth; 0.118cm^2 area and 0.012cm^3 volume. There is Fat Layer (Subcutaneous Tissue) Exposed exposed. There is no tunneling or undermining noted.  There is a small amount of serous drainage noted. The wound margin is distinct with the outline attached to the wound base. There is no granulation within the wound bed. There is a large (67-100%) amount of necrotic tissue within the wound bed including Eschar. Assessment Active Problems ICD-10 Pressure ulcer of other site, stage 2 Cellulitis of right lower limb Pressure ulcer of other site, stage 3 Other specified peripheral vascular diseases Plan Follow-up Appointments: Return Appointment in 2 weeks. Dressing Change Frequency: Wound #37 Right,Lateral Foot: Change dressing every day. Wound #38 Right T Fifth: oe Change dressing every day. Wound Cleansing: Wound #37 Right,Lateral Foot: Clean wound with Wound Cleanser Wound #38 Right T Fifth: oe Clean wound with Wound Cleanser Primary Wound Dressing: Wound #37 Right,Lateral Foot: Calcium Alginate with Silver Wound #38 Right T Fifth: oe Calcium Alginate with Silver Secondary Dressing: Wound #37 Right,Lateral Foot: Kerlix/Rolled Gauze Dry Gauze Wound #38 Right T Fifth: oe Kerlix/Rolled Gauze Dry Gauze Edema Control: Kerlix and Coban - Right Lower Extremity Off-Loading: Multipodus Splint to: - sage boots or equivalent at all times Turn and reposition every 2 hours Additional Orders / Instructions: Other: - discontinue Gentamycin ointment. discontinue oral Cefdinir antibiotic. Home Health: Continue Home Health skilled nursing for wound care. - [facility] 1. I suspect these been set up for a partial fifth ray amputation. He is nonambulatory. 2. I do not believe that he has a significant arterial issue I think he has a good chance of healing of surgical wound 3 the infection part of this that  prompted all of this from 3 weeks ago was a lot better. I gave him cefdinir he did well with this 4. Could is consider treating him empirically for the underlying osteomyelitis however after some thought I think the surgery option is  probably not a bad one Electronic Signature(s) Signed: 09/28/2019 5:25:34 PM By: Baltazar Najjar MD Entered By: Baltazar Najjar on 09/28/2019 12:39:22 -------------------------------------------------------------------------------- SuperBill Details Patient Name: Date of Service: [name] 09/28/2019 Medical Record Number: [id number] Patient Account Number: [id number] Date of Birth/Sex: Treating RN: 1961-02-20 (58 y.o. [name] Primary Care Provider: Orpah Cobb Other Clinician: Referring Provider: Treating Provider/Extender: Candiss Norse in Treatment: 115 Diagnosis Coding ICD-10 Codes Code Description [phone] Pressure ulcer of other site, stage 2 L03.115 Cellulitis of right lower limb L89.893 Pressure ulcer of other site, stage 3 I73.89 Other specified peripheral vascular diseases M86.271 Subacute osteomyelitis, right ankle and foot Physician Procedures : CPT4 Code Description Modifier [id number] - WC PHYS LEVEL 3 - EST PT ICD-10 Diagnosis Description L89.893 Pressure ulcer of other site, stage 3 M86.271 Subacute osteomyelitis, right ankle and foot Quantity: 1 Electronic Signature(s) Signed: 09/28/2019 5:25:34 PM By: Baltazar Najjar MD Entered By: Baltazar Najjar on 09/28/2019 12:40:26

## 2019-09-30 NOTE — Progress Notes (Signed)
KINSER, BIESE ([id number]) Visit Report for 09/28/2019 Arrival Information Details Patient Name: Date of Service: [name], [name] 09/28/2019 10:45 A M Medical Record Number: [id number] Patient Account Number: [id number] Date of Birth/Sex: Treating RN: 07-19-1960 (59 y.o. [name] Primary Care [name]: [name] Other Clinician: Referring [name]: Treating [name]/Extender: [name] in Treatment: 115 Visit Information History Since Last Visit Added or deleted any medications: No Patient Arrived: Wheel Chair Any new allergies or adverse reactions: No Arrival Time: 11:12 Had a fall or experienced change in No Accompanied By: caregiver activities of daily living that may affect Transfer Assistance: None risk of falls: Patient Identification Verified: Yes Signs or symptoms of abuse/neglect since last visito No Secondary Verification Process Completed: Yes Hospitalized since last visit: No Patient Requires Transmission-Based Precautions: No Implantable device outside of the clinic excluding No Patient Has Alerts: Yes cellular tissue based products placed in the center Patient Alerts: R ABI 1.14, L ABI 1.16 since last visit: Has Dressing in Place as Prescribed: Yes Has Compression in Place as Prescribed: Yes Pain Present Now: No Electronic Signature(s) Signed: 09/30/2019 5:16:23 PM By: [name] Abts RN, BSN Entered By: [name] on 09/28/2019 11:12:24 -------------------------------------------------------------------------------- Encounter Discharge Information Details Patient Name: Date of Service: [name] 09/28/2019 10:45 A M Medical Record Number: [id number] Patient Account Number: [id number] Date of Birth/Sex: Treating RN: 1960-08-02 (60 y.o. [name] Primary Care [name]: [name] Other Clinician: Referring [name]: Treating [name]/Extender: [name] in  Treatment: 115 Encounter Discharge Information Items Discharge Condition: Stable Ambulatory Status: Wheelchair Discharge Destination: Skilled Nursing Facility Telephoned: No Orders Sent: Yes Transportation: Other Accompanied By: caregiver Schedule Follow-up Appointment: Yes Clinical Summary of Care: Patient Declined Electronic Signature(s) Signed: 09/28/2019 5:21:30 PM By: [name] Entered By: [name] on 09/28/2019 14:56:20 -------------------------------------------------------------------------------- Multi Wound Chart Details Patient Name: Date of Service: [name] 09/28/2019 10:45 A M Medical Record Number: [id number] Patient Account Number: [id number] Date of Birth/Sex: Treating RN: 1960/11/03 (58 y.o. [name]) [name] Primary Care [name]: [name] Other Clinician: Referring [name]: Treating [name]/Extender: [name] in Treatment: 115 Vital Signs Height(in): 73 Pulse(bpm): 70 Weight(lbs): 178 Blood Pressure(mmHg): 125/80 Body Mass Index(BMI): 23 Temperature(F): 98.0 Respiratory Rate(breaths/min): 16 Photos: [37:No Photos [name], Lateral Foot] [38:No Photos [name] T Fifth oe] [N/A:N/A N/A] Wound Location: [37:Pressure Injury] [38:Shear/Friction] [N/A:N/A] Wounding Event: [37:Pressure Ulcer] [38:Pressure Ulcer] [N/A:N/A] Primary Etiology: [37:Deep Vein Thrombosis, Colitis,] [38:Deep Vein Thrombosis, Colitis,] [N/A:N/A] Comorbid History: [37:Osteomyelitis, Neuropathy, Quadriplegia, Confinement Anxiety 05/24/2019] [38:Osteomyelitis, Neuropathy, Quadriplegia, Confinement Anxiety 07/21/2019] [N/A:N/A] Date Acquired: [37:13] [38:9] [N/A:N/A] Weeks of Treatment: [37:Open] [38:Open] [N/A:N/A] Wound Status: [37:3.6x2.4x0.1] [38:0.3x0.5x0.1] [N/A:N/A] Measurements L x W x D (cm) [37:6.786] [38:0.118] [N/A:N/A] A (cm) : rea [37:0.679] [38:0.012] [N/A:N/A] Volume (cm) : [37:-7.30%] [38:73.20%] [N/A:N/A] %  Reduction in A rea: [37:46.30%] [38:86.40%] [N/A:N/A] % Reduction in Volume: [37:Category/Stage III] [38:Category/Stage II] [N/A:N/A] Classification: [37:Medium] [38:Small] [N/A:N/A] Exudate A mount: [37:Serous] [38:Serous] [N/A:N/A] Exudate Type: [37:amber] [38:amber] [N/A:N/A] Exudate Color: [37:Flat and Intact] [38:Distinct, outline attached] [N/A:N/A] Wound Margin: [37:Medium (34-66%)] [38:None Present (0%)] [N/A:N/A] Granulation A mount: [37:Pink] [38:N/A] [N/A:N/A] Granulation Quality: [37:Medium (34-66%)] [38:Large (67-100%)] [N/A:N/A] Necrotic A mount: [37:Adherent Slough] [38:Eschar] [N/A:N/A] Necrotic Tissue: [37:Fat Layer (Subcutaneous Tissue)] [38:Fat Layer (Subcutaneous Tissue)] [N/A:N/A] Exposed Structures: [37:Exposed: Yes Fascia: No Tendon: No Muscle: No Joint: No Bone: No Small (1-33%)] [38:Exposed: Yes Fascia: No Tendon: No Muscle: No Joint: No Bone: No Large (67-100%)] [N/A:N/A] Treatment Notes Electronic Signature(s) Signed: 09/28/2019 5:25:34 PM  By: [name] Najjar MD Signed: 09/29/2019 4:37:47 PM By: [name] Pax RN Entered By: [name] on 09/28/2019 12:32:33 -------------------------------------------------------------------------------- Multi-Disciplinary Care Plan Details Patient Name: Date of Service: [name], [name] 09/28/2019 10:45 A M Medical Record Number: [id number] Patient Account Number: [id number] Date of Birth/Sex: Treating RN: [birth year] (58 y.o. [name]) [name] Primary Care [name]: [name] Other Clinician: Referring [name]: Treating Calianna Kim/Extender: [name] in Treatment: 115 Active Inactive Soft Tissue Infection Nursing Diagnoses: Impaired tissue integrity Knowledge deficit related to disease process and management Knowledge deficit related to home infection control: handwashing, handling of soiled dressings, supply storage Goals: Patient's soft tissue infection will resolve Date Initiated:  08/18/2019 Target Resolution Date: 10/22/2019 Goal Status: Active Interventions: Assess signs and symptoms of infection every visit Provide education on infection Treatment Activities: Culture : 08/18/2019 Culture and sensitivity : 08/18/2019 Education provided on Infection : 08/31/2019 Notes: Wound/Skin Impairment Nursing Diagnoses: Impaired tissue integrity Knowledge deficit related to ulceration/compromised skin integrity Goals: Patient/caregiver will verbalize understanding of skin care regimen Date Initiated: 07/14/2017 Target Resolution Date: 10/22/2019 Goal Status: Active Ulcer/skin breakdown will have a volume reduction of 30% by week 4 Date Initiated: 07/14/2017 Date Inactivated: 08/18/2017 Target Resolution Date: 08/15/2017 Goal Status: Met Ulcer/skin breakdown will have a volume reduction of 50% by week 8 Date Initiated: 07/14/2017 Date Inactivated: 10/09/2017 Target Resolution Date: 09/19/2017 Goal Status: Unmet Unmet Reason: infection Ulcer/skin breakdown will heal within 14 weeks Date Initiated: 07/14/2017 Date Inactivated: 10/24/2017 Target Resolution Date: 10/24/2017 Goal Status: Unmet Unmet Reason: complex comorbidities Interventions: Assess patient/caregiver ability to obtain necessary supplies Assess patient/caregiver ability to perform ulcer/skin care regimen upon admission and as needed Assess ulceration(s) every visit Provide education on ulcer and skin care Notes: Electronic Signature(s) Signed: 09/29/2019 4:37:47 PM By: [name] Pax RN Entered By: [name] on 09/28/2019 11:34:34 -------------------------------------------------------------------------------- Pain Assessment Details Patient Name: Date of Service: [name], [name] 09/28/2019 10:45 A M Medical Record Number: [id number] Patient Account Number: [id number] Date of Birth/Sex: Treating RN: [birth year] (59 y.o. [name] Primary Care Keylani Perlstein: Other Clinician: Orpah [name] Referring  Bonny Egger: Treating Kiandre Spagnolo/Extender: [name] in Treatment: 115 Active Problems Location of Pain Severity and Description of Pain Patient Has Paino No Site Locations Pain Management and Medication Current Pain Management: Electronic Signature(s) Signed: 09/30/2019 5:16:23 PM By: [name] Abts RN, BSN Entered By: [name] on 09/28/2019 11:12:54 -------------------------------------------------------------------------------- Patient/Caregiver Education Details Patient Name: Date of Service: [name] 5/4/2021andnbsp10:45 A M Medical Record Number: [id number] Patient Account Number: [id number] Date of Birth/Gender: Treating RN: 1961-03-30 (58 y.o. [name] Primary Care Physician: [name] Other Clinician: Referring Physician: Treating Physician/Extender: [name] in Treatment: 115 Education Assessment Education Provided To: Patient Education Topics Provided Infection: Methods: Explain/Verbal Responses: State content correctly Wound/Skin Impairment: Methods: Explain/Verbal Responses: State content correctly Electronic Signature(s) Signed: 09/29/2019 4:37:47 PM By: [name] Pax RN Entered By: [name] on 09/28/2019 11:35:01 -------------------------------------------------------------------------------- Wound Assessment Details Patient Name: Date of Service: [name], [name] 09/28/2019 10:45 A M Medical Record Number: [id number] Patient Account Number: [id number] Date of Birth/Sex: Treating RN: Sep 20, 1960 (59 y.o. [name] Primary Care Merisa Julio: [name] Other Clinician: Referring Liana Camerer: Treating Shelbey Spindler/Extender: [name] in Treatment: 115 Wound Status Wound Number: 37 Primary Pressure Ulcer Etiology: Wound Location: [name], Lateral Foot Wound Open Wounding Event: Pressure Injury Status: Date Acquired: 05/24/2019 Comorbid  Deep Vein Thrombosis, Colitis, Osteomyelitis, Neuropathy, Weeks Of Treatment: 13 History: Quadriplegia, Confinement Anxiety Clustered Wound:  No Photos Photo Uploaded By: Benjaman Kindler on 09/29/2019 11:22:27 Wound Measurements Length: (cm) 3.6 % R Width: (cm) 2.4 % R Depth: (cm) 0.1 Epi Area: (cm) 6.786 Tu Volume: (cm) 0.679 Un eduction in Area: -7.3% eduction in Volume: 46.3% thelialization: Small (1-33%) nneling: No dermining: No Wound Description Classification: Category/Stage III Fou Wound Margin: Flat and Intact Slo Exudate Amount: Medium Exudate Type: Serous Exudate Color: amber l Odor After Cleansing: No ugh/Fibrino Yes Wound Bed Granulation Amount: Medium (34-66%) Exposed Structure Granulation Quality: Pink Fascia Exposed: No Necrotic Amount: Medium (34-66%) Fat Layer (Subcutaneous Tissue) Exposed: Yes Necrotic Quality: Adherent Slough Tendon Exposed: No Muscle Exposed: No Joint Exposed: No Bone Exposed: No Treatment Notes Wound #37 ([name], Lateral Foot) 1. Cleanse With Wound Cleanser 2. Periwound Care Moisturizing lotion 3. Primary Dressing Applied Calcium Alginate Ag 4. Secondary Dressing Dry Gauze 6. Support Layer Psychologist, educational) Signed: 09/30/2019 5:16:23 PM By: [name] Abts RN, BSN Entered By: [name] on 09/28/2019 11:15:33 -------------------------------------------------------------------------------- Wound Assessment Details Patient Name: Date of Service: [name], [name] 09/28/2019 10:45 A M Medical Record Number: [id number] Patient Account Number: [id number] Date of Birth/Sex: Treating RN: 1960/12/19 (59 y.o. [name] Primary Care Keagon Glascoe: [name] Other Clinician: Referring Najmo Pardue: Treating Haylynn Pha/Extender: [name] in Treatment: 115 Wound Status Wound Number: 38 Primary Pressure Ulcer Etiology: Wound Location: [name] T Fifth oe Wound  Open Wounding Event: Shear/Friction Status: Date Acquired: 07/21/2019 Comorbid Deep Vein Thrombosis, Colitis, Osteomyelitis, Neuropathy, Weeks Of Treatment: 9 History: Quadriplegia, Confinement Anxiety Clustered Wound: No Photos Photo Uploaded By: Benjaman Kindler on 09/29/2019 11:22:45 Wound Measurements Length: (cm) 0.3 Width: (cm) 0.5 Depth: (cm) 0.1 Area: (cm) 0.118 Volume: (cm) 0.012 % Reduction in Area: 73.2% % Reduction in Volume: 86.4% Epithelialization: Large (67-100%) Tunneling: No Undermining: No Wound Description Classification: Category/Stage II Wound Margin: Distinct, outline attached Exudate Amount: Small Exudate Type: Serous Exudate Color: amber Foul Odor After Cleansing: No Slough/Fibrino No Wound Bed Granulation Amount: None Present (0%) Exposed Structure Necrotic Amount: Large (67-100%) Fascia Exposed: No Necrotic Quality: Eschar Fat Layer (Subcutaneous Tissue) Exposed: Yes Tendon Exposed: No Muscle Exposed: No Joint Exposed: No Bone Exposed: No Treatment Notes Wound #38 ([name] Toe Fifth) 1. Cleanse With Wound Cleanser 2. Periwound Care Moisturizing lotion 3. Primary Dressing Applied Calcium Alginate Ag 4. Secondary Dressing Dry Gauze 6. Support Layer Psychologist, educational) Signed: 09/30/2019 5:16:23 PM By: [name] Abts RN, BSN Entered By: [name] on 09/28/2019 11:16:17 -------------------------------------------------------------------------------- Vitals Details Patient Name: Date of Service: [name], [name] Gess. 09/28/2019 10:45 A M Medical Record Number: [id number] Patient Account Number: [id number] Date of Birth/Sex: Treating RN: 07-25-1960 (59 y.o. [name] Primary Care Leontine Radman: [name] Other Clinician: Referring Tais Koestner: Treating Annalise Mcdiarmid/Extender: [name] in Treatment: 115 Vital Signs Time Taken: 11:12 Temperature (F): 98.0 Height (in): 73 Pulse  (bpm): 70 Weight (lbs): 178 Respiratory Rate (breaths/min): 16 Body Mass Index (BMI): 23.5 Blood Pressure (mmHg): 125/80 Reference Range: 80 - 120 mg / dl Electronic Signature(s) Signed: 09/30/2019 5:16:23 PM By: [name] Abts RN, BSN Entered By: [name] on 09/28/2019 11:12:48

## 2019-10-01 NOTE — Progress Notes (Signed)
Left message with [name] (shelley off) patient needs to be moved to main or due to patient is 2 person hoyer lift transfer and will need to be same day labs due to lives in group home.Contact for group home is trenee [phone] phone.

## 2019-10-04 NOTE — Progress Notes (Signed)
Marcheta Grammes rn returned call and stated dr price put in for [facility] or request for surgery for 10-08-2019.

## 2019-10-05 NOTE — Progress Notes (Signed)
Spoke with [facility] and they are to fax current MAR, demographic face sheet , current med hx along with poa info..  DR Bakare,PCp -LOV on 10/01/19.  Called and requested LOV note from 10/01/19 to be faxed to [phone].

## 2019-10-05 NOTE — Progress Notes (Signed)
Spoke with shelley, or time for [facility] 10-08-2019 has been requested and pt to be moved to [facility] or.

## 2019-10-05 NOTE — Progress Notes (Signed)
Need orders in epic for surgery on 10/08/19.  Thanks.

## 2019-10-06 ENCOUNTER — Encounter (HOSPITAL_COMMUNITY): Payer: Self-pay | Admitting: Podiatry

## 2019-10-06 NOTE — Progress Notes (Signed)
Attempted to  Call sister- Lynda Rainwater x 2 with no answer.

## 2019-10-06 NOTE — Progress Notes (Signed)
Faxed to [facility] preop instructions.

## 2019-10-06 NOTE — Progress Notes (Addendum)
Preop instructions for:  [name]                        Date of Birth April 09, 2061                            Date of Procedure:  10/08/2019       Doctor: Dr Ventura Sellers  Time to arrive at [facility]: Report to: Admitting  Procedure: Right Fifth toe Amputation    Do not eat  past midnight the night before your procedure.(To include any tube feedings-must be discontinued) May have clear liquids from until 1130am morning of procedure then nothing by mouth.     CLEAR LIQUID DIET   Foods Allowed                                                                     Foods Excluded  Coffee and tea, regular and decaf                             liquids that you cannot  Plain Jell-O any favor except red or purple                                           see through such as: Fruit ices (not with fruit pulp)                                     milk, soups, orange juice  Iced Popsicles                                    All solid food Carbonated beverages, regular and diet                                    Cranberry, grape and apple juices Sports drinks like Gatorade Lightly seasoned clear broth or consume(fat free) Sugar, honey syrup  Sample Menu Breakfast                                Lunch                                     Supper Cranberry juice                    Beef broth                            Chicken broth Jell-O  Grape juice                           Apple juice Coffee or tea                        Jell-O                                      Popsicle                                                Coffee or tea                        Coffee or tea  _____________________________________________________________________    Take these morning medications only with sips of water.(or give through gastrostomy or feeding tube). Baclofen, Divalproex, , Tizanidine , Lorazepam prn      Facility contact: [place]                   Phone:  [phone]                Health Care [name]  [phone] or [phone] Transportation contact phone#: [place]  Please send day of procedure:current med list and meds last taken that day, confirm nothing by mouth status from what time, Patient Demographic info( to include DNR status, problem list, allergies)   RN contact name/phone#:                             and Fax [employer] card and picture ID Leave all jewelry and other valuables at place where living( no metal or rings to be worn) No contact lens   Men-no colognes,lotions  Any questions day of procedure,call  SHORT STAY-[phone]   Sent from :[facility]                   [place]                   [phone]  Sent by :  Gillian Shields RN

## 2019-10-07 NOTE — Progress Notes (Signed)
Anesthesia Chart Review   Case: [id number] Date/Time: 10/08/19 1410   Procedure: RIGHT FIFTH RAY AMPUTATUON (Right )   Anesthesia type: Monitor Anesthesia Care   Pre-op diagnosis: OSTEOMYELTIS   Location: [facility] ROOM 08 / [facility]   Surgeons: Park Liter, DPM      DISCUSSION:59 y.o. 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   H&P dated 10/01/19 from PCP on chart. Per H&P note, "stable for low to intermediate risk surgery."  Per note pt with sacral pressure ulcer with onset 07/29/2019. Cardiac risk is low with RCRI 0.9%.   Lives at [facility].  POA is sister Lynda Rainwater, [place] on chart.    Anticipate pt can proceed with planned procedure barring acute status change.   VS: There were no vitals taken for this visit.  PROVIDERS: Joana Reamer, DO is PCP   [name], MD is PCP with [facility]  LABS: labs DOS (all labs ordered are listed, but only abnormal results are displayed)  Labs Reviewed - No data to display   IMAGES:   EKG: 04/09/2019 Rate 81 bpm  Normal sinus rhythm  Nonspecific T wave abnormality   CV:  Past Medical History:  Diagnosis Date  . Anemia    microcytic anemia   . Anxiety    takes Ativan daily as needed  . Bipolar disorder (HCC)    takes Depakote daily  . CEREBRAL PALSY 07/24/2006  . Chronic bronchitis (HCC)    "gets it q yr"  . Chronic bronchitis (HCC)   . 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)   . Constipation    takes Miralax daily as needed  . Depression    takes Risperdal nightly  . Environmental allergies    takes Zyrtec daily  . GASTRIC ULCER ACUTE WITH HEMORRHAGE 07/24/2006  .  Gastric ulcer with hemorrhage    hx of   . GERD (gastroesophageal reflux disease)    takes Pravacid daily  . History of blood transfusion    no abnormal reaction noted  . History of hiatal hernia   . History of shingles   . History of stomach ulcers ~ 1996  . Hypercholesterolemia   . Intellectual functioning disability   . Intermittent explosive disorder   . Internal hemorrhoids   . MRSA infection 09/06/2019  . Obsessive compulsive disorder   . Osteomyelitis (HCC)    right foot   . Pneumonia "quite a few times"   in 2015 and in June 2016  . Pressure ulcer of sacral region    hx of   . Pseudomonas aeruginosa infection 09/06/2019  . Psychotic disorder (HCC)   . Recurrent UTI (urinary tract infection)   . Seborrheic dermatitis   . Sigmoid diverticulosis   . Spastic tetraplegia (HCC)   . URINARY INCONTINENCE 02/09/2010   takes Detrol daily-occasionally incontinence  . Vitamin D deficiency   . Weakness    numbness in extremities    Past Surgical History:  Procedure Laterality Date  . AMPUTATION Right 12/14/2014   Procedure: AMPUTATION DIGIT RIGHT GREAT TOE MTP;  Surgeon: Nadara Mustard, MD;  Location: [facility];  Service: Orthopedics;  Laterality: Right;  . BIOPSY  08/12/2018   Procedure: BIOPSY;  Surgeon: Jackquline Denmark, MD;  Location: [facility];  Service: Gastroenterology;;  . COLONOSCOPY WITH PROPOFOL N/A 08/14/2018   Procedure: COLONOSCOPY WITH PROPOFOL;  Surgeon: Jackquline Denmark, MD;  Location: [facility];  Service: Gastroenterology;  Laterality: N/A;  . ESOPHAGOGASTRODUODENOSCOPY (EGD) WITH PROPOFOL N/A 08/12/2018   Procedure: ESOPHAGOGASTRODUODENOSCOPY (EGD) WITH PROPOFOL;  Surgeon: Jackquline Denmark, MD;  Location: [facility];  Service: Gastroenterology;  Laterality: N/A;  . head surgery  as a baby   due to "water head"   . HERNIA REPAIR    . HIATAL HERNIA REPAIR  1982  . LEG SURGERY Bilateral ~ 1967   "legs were scissored; stretched them out"    MEDICATIONS: No current  facility-administered medications for this encounter.   Marland Kitchen acetaminophen (TYLENOL) 500 MG tablet  . alum & mag hydroxide-simeth (MYLANTA) 200-200-20 MG/5ML suspension  . baclofen (LIORESAL) 20 MG tablet  . brompheniramine-pseudoephedrine (DIMETAPP) 1-15 MG/5ML ELIX  . cetirizine (ZYRTEC) 10 MG tablet  . Cholecalciferol (VITAMIN D3) 5000 units CAPS  . clotrimazole (LOTRIMIN) 1 % cream  . divalproex (DEPAKOTE SPRINKLE) 125 MG capsule  . EAR DROPS EARWAX AID 6.5 % OTIC solution  . guaiFENesin-dextromethorphan (ROBITUSSIN DM) 100-10 MG/5ML syrup  . hydrocortisone cream 1 %  . hydrocortisone valerate cream (WESTCORT) 0.2 %  . iron polysaccharides (NIFEREX) 150 MG capsule  . ketoconazole (NIZORAL) 2 % shampoo  . LORazepam (ATIVAN) 2 MG tablet  . Neomycin-Bacitracin-Polymyxin (TRIPLE ANTIBIOTIC EX)  . omeprazole (PRILOSEC) 40 MG capsule  . Oyster Shell (OYSTER CALCIUM) 500 MG TABS tablet  . polyethylene glycol (MIRALAX) 17 g packet  . risperiDONE (RISPERDAL) 2 MG tablet  . tamsulosin (FLOMAX) 0.4 MG CAPS capsule  . tiZANidine (ZANAFLEX) 2 MG tablet  . tolterodine (DETROL LA) 2 MG 24 hr capsule  . tretinoin (RETIN-A) 0.025 % gel  . vitamin C (VITAMIN C) 250 MG tablet  . VOLTAREN 1 % GEL  . Zinc Oxide 40 % PSTE  . Elastic Bandages & Supports (JOBST ANTI-EM KNEE HIGH MED) MISC  . Foot Care Products ([place]) MISC     Maia Plan [facility] Pre-Surgical Testing [phone] 10/07/19  11:57 AM

## 2019-10-07 NOTE — Progress Notes (Signed)
Anesthesia Review:  PCP: Dr Sharyl Nimrod and P on chart dated 10/01/19.   Cardiologist : Chest x-ray : EKG :11//13/20 Echo : Cardiac Cath :  Sleep Study/ CPAP : Fasting Blood Sugar :      / Checks Blood Sugar -- times a day:   Blood Thinner/ Instructions /Last Dose: ASA / Instructions/ Last Dose :   Patient denies shortness of breath, chest pain, fever, and cough at this phone interview. Same Day Cerebral palsy , Quad, Lives at [facility] .  POA is siter [name] fulcher.  POA on chart.

## 2019-10-07 NOTE — Anesthesia Preprocedure Evaluation (Addendum)
Anesthesia Evaluation  Patient identified by MRN, date of birth, ID band Patient awake    Reviewed: Allergy & Precautions, NPO status , Patient's Chart, lab work & pertinent test results  History of Anesthesia Complications Negative for: history of anesthetic complications  Airway Mallampati: II  TM Distance: >3 FB Neck ROM: Full    Dental no notable dental hx.    Pulmonary pneumonia,    Pulmonary exam normal        Cardiovascular Normal cardiovascular exam     Neuro/Psych PSYCHIATRIC DISORDERS Anxiety Depression Bipolar Disorder Schizophrenia negative neurological ROS     GI/Hepatic hiatal hernia, PUD, GERD  ,  Endo/Other  diabetes  Renal/GU      Musculoskeletal   Abdominal   Peds  Hematology  (+) Blood dyscrasia, anemia ,   Anesthesia Other Findings   Reproductive/Obstetrics                                                            Anesthesia Evaluation  Patient identified by MRN, date of birth, ID band Patient awake    Reviewed: Allergy & Precautions, NPO status , Patient's Chart, lab work & pertinent test results  History of Anesthesia Complications Negative for: history of anesthetic complications  Airway Mallampati: II  TM Distance: >3 FB Neck ROM: Full    Dental no notable dental hx. (+) Dental Advisory Given   Pulmonary neg pulmonary ROS,    Pulmonary exam normal breath sounds clear to auscultation       Cardiovascular negative cardio ROS Normal cardiovascular exam Rhythm:Regular Rate:Normal     Neuro/Psych Anxiety Depression Bipolar Disorder Cerebral palsy, MR, quadriplegia negative neurological ROS     GI/Hepatic Neg liver ROS, hiatal hernia, PUD, GERD  Medicated,  Endo/Other  negative endocrine ROS  Renal/GU negative Renal ROS     Musculoskeletal  (+) Arthritis ,   Abdominal   Peds  Hematology  (+) anemia ,   Anesthesia Other  Findings Day of surgery medications reviewed with the patient.  Reproductive/Obstetrics                            Anesthesia Physical Anesthesia Plan  ASA: III  Anesthesia Plan: MAC   Post-op Pain Management:    Induction:   PONV Risk Score and Plan: Treatment may vary due to age or medical condition and Propofol infusion  Airway Management Planned: Natural Airway and Simple Face Mask  Additional Equipment:   Intra-op Plan:   Post-operative Plan:   Informed Consent: I have reviewed the patients History and Physical, chart, labs and discussed the procedure including the risks, benefits and alternatives for the proposed anesthesia with the patient or authorized representative who has indicated his/her understanding and acceptance.   Patient has DNR.  Continue DNR and Discussed DNR with patient.   Dental advisory given  Plan Discussed with: CRNA  Anesthesia Plan Comments:        Anesthesia Quick Evaluation  Anesthesia Physical Anesthesia Plan  ASA: III  Anesthesia Plan: MAC   Post-op Pain Management:    Induction: Intravenous  PONV Risk Score and Plan: 1 and Propofol infusion, Treatment may vary due to age or medical condition and Ondansetron  Airway Management Planned: Natural Airway  Additional Equipment: None  Intra-op Plan:   Post-operative Plan:   Informed Consent: I have reviewed the patients History and Physical, chart, labs and discussed the procedure including the risks, benefits and alternatives for the proposed anesthesia with the patient or authorized representative who has indicated his/her understanding and acceptance.     Dental advisory given  Plan Discussed with: CRNA  Anesthesia Plan Comments: (59 y.o. 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   H&P dated 10/01/19 from PCP on chart. Per H&P note, "stable for low to intermediate risk surgery."  Per note pt with sacral pressure ulcer with onset 07/29/2019. Cardiac risk is low with RCRI 0.9%.   Lives at [place].  POA is sister Cinda Quest, [place] on chart. )      Anesthesia Quick Evaluation

## 2019-10-07 NOTE — Progress Notes (Signed)
Sister [name] fulcher to be at [employer] on 5/14 at 100pm

## 2019-10-08 ENCOUNTER — Ambulatory Visit (HOSPITAL_COMMUNITY): Payer: Medicare Other | Admitting: Physician Assistant

## 2019-10-08 ENCOUNTER — Observation Stay (HOSPITAL_COMMUNITY)
Admission: RE | Admit: 2019-10-08 | Discharge: 2019-10-09 | Disposition: A | Discharge status: Home / Self Care | Payer: Medicare Other | Attending: Internal Medicine | Admitting: Internal Medicine

## 2019-10-08 ENCOUNTER — Other Ambulatory Visit: Payer: Self-pay

## 2019-10-08 ENCOUNTER — Encounter (HOSPITAL_COMMUNITY)
Admission: RE | Disposition: A | Discharge status: Home / Self Care | Payer: Self-pay | Source: Home / Self Care | Attending: Internal Medicine

## 2019-10-08 ENCOUNTER — Encounter (HOSPITAL_COMMUNITY): Payer: Self-pay | Admitting: Podiatry

## 2019-10-08 DIAGNOSIS — G800 Spastic quadriplegic cerebral palsy: Secondary | ICD-10-CM | POA: Insufficient documentation

## 2019-10-08 DIAGNOSIS — K5900 Constipation, unspecified: Secondary | ICD-10-CM | POA: Insufficient documentation

## 2019-10-08 DIAGNOSIS — X58XXXA Exposure to other specified factors, initial encounter: Secondary | ICD-10-CM | POA: Diagnosis not present

## 2019-10-08 DIAGNOSIS — Z89411 Acquired absence of right great toe: Secondary | ICD-10-CM | POA: Diagnosis not present

## 2019-10-08 DIAGNOSIS — T7840XA Allergy, unspecified, initial encounter: Secondary | ICD-10-CM | POA: Insufficient documentation

## 2019-10-08 DIAGNOSIS — M86671 Other chronic osteomyelitis, right ankle and foot: Secondary | ICD-10-CM

## 2019-10-08 DIAGNOSIS — R252 Cramp and spasm: Secondary | ICD-10-CM | POA: Insufficient documentation

## 2019-10-08 DIAGNOSIS — Z20822 Contact with and (suspected) exposure to covid-19: Secondary | ICD-10-CM | POA: Diagnosis not present

## 2019-10-08 DIAGNOSIS — Z993 Dependence on wheelchair: Secondary | ICD-10-CM | POA: Diagnosis not present

## 2019-10-08 DIAGNOSIS — Z79899 Other long term (current) drug therapy: Secondary | ICD-10-CM | POA: Insufficient documentation

## 2019-10-08 DIAGNOSIS — R680 Hypothermia, not associated with low environmental temperature: Secondary | ICD-10-CM | POA: Insufficient documentation

## 2019-10-08 DIAGNOSIS — E559 Vitamin D deficiency, unspecified: Secondary | ICD-10-CM | POA: Diagnosis not present

## 2019-10-08 DIAGNOSIS — F319 Bipolar disorder, unspecified: Secondary | ICD-10-CM | POA: Diagnosis not present

## 2019-10-08 DIAGNOSIS — K219 Gastro-esophageal reflux disease without esophagitis: Secondary | ICD-10-CM | POA: Diagnosis not present

## 2019-10-08 DIAGNOSIS — M869 Osteomyelitis, unspecified: Secondary | ICD-10-CM | POA: Diagnosis present

## 2019-10-08 DIAGNOSIS — D649 Anemia, unspecified: Secondary | ICD-10-CM | POA: Insufficient documentation

## 2019-10-08 DIAGNOSIS — F419 Anxiety disorder, unspecified: Secondary | ICD-10-CM | POA: Diagnosis not present

## 2019-10-08 DIAGNOSIS — L97514 Non-pressure chronic ulcer of other part of right foot with necrosis of bone: Secondary | ICD-10-CM

## 2019-10-08 DIAGNOSIS — M86171 Other acute osteomyelitis, right ankle and foot: Principal | ICD-10-CM

## 2019-10-08 DIAGNOSIS — G809 Cerebral palsy, unspecified: Secondary | ICD-10-CM

## 2019-10-08 DIAGNOSIS — S98131A Complete traumatic amputation of one right lesser toe, initial encounter: Secondary | ICD-10-CM | POA: Diagnosis not present

## 2019-10-08 HISTORY — DX: Intermittent explosive disorder: F63.81

## 2019-10-08 HISTORY — DX: Chronic or unspecified gastric ulcer with hemorrhage: K25.4

## 2019-10-08 HISTORY — DX: Osteomyelitis, unspecified: M86.9

## 2019-10-08 HISTORY — DX: Type 2 diabetes mellitus with foot ulcer: E11.621

## 2019-10-08 HISTORY — DX: Quadriplegia, unspecified: G82.50

## 2019-10-08 HISTORY — DX: Other hemorrhoids: K64.8

## 2019-10-08 HISTORY — DX: Diverticulosis of large intestine without perforation or abscess without bleeding: K57.30

## 2019-10-08 HISTORY — DX: Obsessive-compulsive disorder, unspecified: F42.9

## 2019-10-08 HISTORY — DX: Seborrheic dermatitis, unspecified: L21.9

## 2019-10-08 HISTORY — PX: AMPUTATION: SHX166

## 2019-10-08 HISTORY — DX: Pressure ulcer of sacral region, unspecified stage: L89.159

## 2019-10-08 HISTORY — DX: Anemia, unspecified: D64.9

## 2019-10-08 HISTORY — DX: Non-pressure chronic ulcer of other part of unspecified foot with unspecified severity: L97.509

## 2019-10-08 HISTORY — DX: Unspecified intellectual disabilities: F79

## 2019-10-08 HISTORY — DX: Unspecified psychosis not due to a substance or known physiological condition: F29

## 2019-10-08 HISTORY — DX: Vitamin D deficiency, unspecified: E55.9

## 2019-10-08 LAB — BASIC METABOLIC PANEL WITH GFR
Anion gap: 11 (ref 5–15)
BUN: 12 mg/dL (ref 6–20)
CO2: 28 mmol/L (ref 22–32)
Calcium: 9.3 mg/dL (ref 8.9–10.3)
Chloride: 101 mmol/L (ref 98–111)
Creatinine, Ser: 0.44 mg/dL — ABNORMAL LOW (ref 0.61–1.24)
GFR calc Af Amer: >60 mL/min
GFR calc non Af Amer: >60 mL/min
Glucose, Bld: 92 mg/dL (ref 70–99)
Potassium: 4 mmol/L (ref 3.5–5.1)
Sodium: 140 mmol/L (ref 135–145)

## 2019-10-08 LAB — CBC
HCT: 42.3 % (ref 39.0–52.0)
Hemoglobin: 13.1 g/dL (ref 13.0–17.0)
MCH: 24.8 pg — ABNORMAL LOW (ref 26.0–34.0)
MCHC: 31 g/dL (ref 30.0–36.0)
MCV: 80.1 fL (ref 80.0–100.0)
Platelets: 517 10*3/uL — ABNORMAL HIGH (ref 150–400)
RBC: 5.28 MIL/uL (ref 4.22–5.81)
RDW: 18.6 % — ABNORMAL HIGH (ref 11.5–15.5)
WBC: 8.1 10*3/uL (ref 4.0–10.5)
nRBC: 0 % (ref 0.0–0.2)

## 2019-10-08 LAB — SARS CORONAVIRUS 2 BY RT PCR (HOSPITAL ORDER, PERFORMED IN ~~LOC~~ HOSPITAL LAB): SARS Coronavirus 2: NEGATIVE

## 2019-10-08 LAB — GLUCOSE, CAPILLARY: Glucose-Capillary: 90 mg/dL (ref 70–99)

## 2019-10-08 LAB — SURGICAL PCR SCREEN
MRSA, PCR: NEGATIVE
Staphylococcus aureus: NEGATIVE

## 2019-10-08 SURGERY — AMPUTATION, FOOT, RAY
Anesthesia: Monitor Anesthesia Care | Laterality: Right

## 2019-10-08 MED ORDER — LIDOCAINE HCL (CARDIAC) PF 100 MG/5ML IV SOSY
PREFILLED_SYRINGE | INTRAVENOUS | Status: DC | PRN
Start: 2019-10-08 — End: 2019-10-08
  Administered 2019-10-08: 40 mg via INTRATRACHEAL

## 2019-10-08 MED ORDER — FENTANYL CITRATE (PF) 100 MCG/2ML IJ SOLN
INTRAMUSCULAR | Status: AC
  Filled 2019-10-08: qty 2

## 2019-10-08 MED ORDER — PROPOFOL 500 MG/50ML IV EMUL
INTRAVENOUS | Status: DC | PRN
  Administered 2019-10-08: 20 mg via INTRAVENOUS
  Administered 2019-10-08 (×3): 10 mg via INTRAVENOUS

## 2019-10-08 MED ORDER — VANCOMYCIN HCL IN DEXTROSE 1-5 GM/200ML-% IV SOLN
1000.0000 mg | Freq: Two times a day (BID) | INTRAVENOUS | Status: DC
  Administered 2019-10-08 – 2019-10-09 (×2): 1000 mg via INTRAVENOUS
  Filled 2019-10-08 (×2): qty 200

## 2019-10-08 MED ORDER — CEFAZOLIN SODIUM-DEXTROSE 2-4 GM/100ML-% IV SOLN
2.0000 g | Freq: Once | INTRAVENOUS | Status: AC
  Administered 2019-10-08: 2 g via INTRAVENOUS

## 2019-10-08 MED ORDER — ACETAMINOPHEN 650 MG RE SUPP: 650.0000 mg | Freq: Four times a day (QID) | RECTAL | Status: DC | PRN

## 2019-10-08 MED ORDER — LORAZEPAM 1 MG PO TABS: 1.0000 mg | ORAL_TABLET | Freq: Four times a day (QID) | ORAL | Status: DC | PRN

## 2019-10-08 MED ORDER — TAMSULOSIN HCL 0.4 MG PO CAPS
0.4000 mg | ORAL_CAPSULE | Freq: Every day | ORAL | Status: DC
  Administered 2019-10-08 – 2019-10-09 (×2): 0.4 mg via ORAL
  Filled 2019-10-08 (×2): qty 1

## 2019-10-08 MED ORDER — LACTATED RINGERS IV SOLN: INTRAVENOUS | Status: DC

## 2019-10-08 MED ORDER — VANCOMYCIN HCL 1000 MG IV SOLR
INTRAVENOUS | Status: AC
  Filled 2019-10-08: qty 1000

## 2019-10-08 MED ORDER — CEFAZOLIN SODIUM-DEXTROSE 2-4 GM/100ML-% IV SOLN
INTRAVENOUS | Status: AC
  Filled 2019-10-08: qty 100

## 2019-10-08 MED ORDER — FESOTERODINE FUMARATE ER 4 MG PO TB24
4.0000 mg | ORAL_TABLET | Freq: Every day | ORAL | Status: DC
  Administered 2019-10-09: 4 mg via ORAL
  Filled 2019-10-08: qty 1

## 2019-10-08 MED ORDER — GUAIFENESIN-DM 100-10 MG/5ML PO SYRP: 10.0000 mL | ORAL_SOLUTION | ORAL | Status: DC | PRN

## 2019-10-08 MED ORDER — FENTANYL CITRATE (PF) 100 MCG/2ML IJ SOLN
INTRAMUSCULAR | Status: DC | PRN
  Administered 2019-10-08: 50 ug via INTRAVENOUS

## 2019-10-08 MED ORDER — POLYSACCHARIDE IRON COMPLEX 150 MG PO CAPS
150.0000 mg | ORAL_CAPSULE | Freq: Every day | ORAL | Status: DC
  Administered 2019-10-08 – 2019-10-09 (×2): 150 mg via ORAL
  Filled 2019-10-08 (×2): qty 1

## 2019-10-08 MED ORDER — PANTOPRAZOLE SODIUM 40 MG PO TBEC
40.0000 mg | DELAYED_RELEASE_TABLET | Freq: Every day | ORAL | Status: DC
  Administered 2019-10-09: 40 mg via ORAL
  Filled 2019-10-08: qty 1

## 2019-10-08 MED ORDER — ONDANSETRON HCL 4 MG/2ML IJ SOLN: 4.0000 mg | Freq: Once | INTRAMUSCULAR | Status: DC | PRN

## 2019-10-08 MED ORDER — SODIUM CHLORIDE 0.9 % IV SOLN
2.0000 g | Freq: Three times a day (TID) | INTRAVENOUS | Status: DC
  Administered 2019-10-08 – 2019-10-09 (×3): 2 g via INTRAVENOUS
  Filled 2019-10-08 (×4): qty 2

## 2019-10-08 MED ORDER — VANCOMYCIN HCL 1000 MG IV SOLR
INTRAVENOUS | Status: DC | PRN
  Administered 2019-10-08: 1000 mg via TOPICAL

## 2019-10-08 MED ORDER — ALUM & MAG HYDROXIDE-SIMETH 200-200-20 MG/5ML PO SUSP: 30.0000 mL | Freq: Four times a day (QID) | ORAL | Status: DC | PRN

## 2019-10-08 MED ORDER — RISPERIDONE 1 MG PO TABS
2.0000 mg | ORAL_TABLET | Freq: Every day | ORAL | Status: DC
  Administered 2019-10-08: 2 mg via ORAL
  Filled 2019-10-08: qty 2

## 2019-10-08 MED ORDER — HYDROCODONE-ACETAMINOPHEN 5-325 MG PO TABS
1.0000 | ORAL_TABLET | ORAL | Status: DC | PRN
  Administered 2019-10-08 – 2019-10-09 (×3): 1 via ORAL
  Filled 2019-10-08 (×3): qty 1

## 2019-10-08 MED ORDER — POLYETHYLENE GLYCOL 3350 17 G PO PACK: 17.0000 g | PACK | Freq: Every day | ORAL | Status: DC | PRN

## 2019-10-08 MED ORDER — SODIUM CHLORIDE 0.9 % IR SOLN
Status: DC | PRN
  Administered 2019-10-08: 3000 mL

## 2019-10-08 MED ORDER — BUPIVACAINE HCL (PF) 0.5 % IJ SOLN
INTRAMUSCULAR | Status: AC
  Filled 2019-10-08: qty 30

## 2019-10-08 MED ORDER — BUPIVACAINE HCL (PF) 0.5 % IJ SOLN
INTRAMUSCULAR | Status: DC | PRN
  Administered 2019-10-08: 10 mL

## 2019-10-08 MED ORDER — PROPOFOL 500 MG/50ML IV EMUL
INTRAVENOUS | Status: DC | PRN
  Administered 2019-10-08: 75 ug/kg/min via INTRAVENOUS

## 2019-10-08 MED ORDER — ONDANSETRON HCL 4 MG PO TABS: 4.0000 mg | ORAL_TABLET | Freq: Four times a day (QID) | ORAL | Status: DC | PRN

## 2019-10-08 MED ORDER — ONDANSETRON HCL 4 MG/2ML IJ SOLN: 4.0000 mg | Freq: Four times a day (QID) | INTRAMUSCULAR | Status: DC | PRN

## 2019-10-08 MED ORDER — FENTANYL CITRATE (PF) 100 MCG/2ML IJ SOLN: 25.0000 ug | INTRAMUSCULAR | Status: DC | PRN

## 2019-10-08 MED ORDER — BACLOFEN 20 MG PO TABS
20.0000 mg | ORAL_TABLET | Freq: Three times a day (TID) | ORAL | Status: DC
  Administered 2019-10-08 – 2019-10-09 (×2): 20 mg via ORAL
  Filled 2019-10-08 (×2): qty 1

## 2019-10-08 MED ORDER — DIVALPROEX SODIUM 125 MG PO CSDR
500.0000 mg | DELAYED_RELEASE_CAPSULE | Freq: Two times a day (BID) | ORAL | Status: DC
  Administered 2019-10-08 – 2019-10-09 (×2): 500 mg via ORAL
  Filled 2019-10-08 (×2): qty 4

## 2019-10-08 MED ORDER — MEPERIDINE HCL 50 MG/ML IJ SOLN: 6.2500 mg | INTRAMUSCULAR | Status: DC | PRN

## 2019-10-08 MED ORDER — ACETAMINOPHEN 325 MG PO TABS: 650.0000 mg | ORAL_TABLET | Freq: Four times a day (QID) | ORAL | Status: DC | PRN

## 2019-10-08 SURGICAL SUPPLY — 34 items
BLADE SURG 15 STRL LF DISP TIS (BLADE) ×1 IMPLANT
BLADE SURG 15 STRL SS (BLADE) ×3
BNDG ELASTIC 4X5.8 VLCR STR LF (GAUZE/BANDAGES/DRESSINGS) ×3 IMPLANT
BNDG ELASTIC 6X5.8 VLCR STR LF (GAUZE/BANDAGES/DRESSINGS) ×3 IMPLANT
CLEANER TIP ELECTROSURG 2X2 (MISCELLANEOUS) ×3 IMPLANT
CNTNR URN SCR LID CUP LEK RST (MISCELLANEOUS) ×1 IMPLANT
CONT SPEC 4OZ STRL OR WHT (MISCELLANEOUS) ×3
COVER MAYO STAND STRL (DRAPES) ×3 IMPLANT
COVER SURGICAL LIGHT HANDLE (MISCELLANEOUS) ×3 IMPLANT
COVER WAND RF STERILE (DRAPES) IMPLANT
CUFF TOURN SGL QUICK 24 (TOURNIQUET CUFF) ×3
CUFF TRNQT CYL 24X4X16.5-23 (TOURNIQUET CUFF) ×1 IMPLANT
DECANTER SPIKE VIAL GLASS SM (MISCELLANEOUS) ×3 IMPLANT
GAUZE SPONGE 4X4 12PLY STRL (GAUZE/BANDAGES/DRESSINGS) ×9 IMPLANT
GLOVE BIO SURGEON STRL SZ7.5 (GLOVE) ×3 IMPLANT
GLOVE BIOGEL PI IND STRL 8 (GLOVE) ×1 IMPLANT
GLOVE BIOGEL PI INDICATOR 8 (GLOVE) ×2
GOWN STRL REUS W/TWL XL LVL3 (GOWN DISPOSABLE) ×3 IMPLANT
KIT BASIN (CUSTOM PROCEDURE TRAY) ×3 IMPLANT
KIT TURNOVER KIT A (KITS) IMPLANT
MARKER SKIN DUAL TIP RULER LAB (MISCELLANEOUS) ×3 IMPLANT
NEEDLE HYPO 25X1 1.5 SAFETY (NEEDLE) ×3 IMPLANT
PACK ORTHO EXTREMITY (CUSTOM PROCEDURE TRAY) ×3 IMPLANT
PADDING UNDERCAST 2 STRL (CAST SUPPLIES) ×2
PADDING UNDERCAST 2X4 STRL (CAST SUPPLIES) ×1 IMPLANT
PENCIL SMOKE EVACUATOR (MISCELLANEOUS) IMPLANT
SUT PROLENE 4 0 PS 2 18 (SUTURE) ×3 IMPLANT
SUT VIC AB 1 CT1 27 (SUTURE) ×3
SUT VIC AB 1 CT1 27XBRD ANTBC (SUTURE) ×1 IMPLANT
SYR 20ML LL LF (SYRINGE) ×3 IMPLANT
TOWEL OR 17X26 10 PK STRL BLUE (TOWEL DISPOSABLE) ×3 IMPLANT
TOWEL OR NON WOVEN STRL DISP B (DISPOSABLE) ×3 IMPLANT
TRAY PREP A LATEX SAFE STRL (SET/KITS/TRAYS/PACK) ×3 IMPLANT
UNDERPAD 30X36 HEAVY ABSORB (UNDERPADS AND DIAPERS) ×12 IMPLANT

## 2019-10-08 NOTE — Op Note (Addendum)
Patient Name: [name] DOB: 15-Aug-1960  MRN: [id number]   Date of Service: 10/08/2019  Surgeon: Dr. 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 Post-operative Diagnosis:  Same Procedures:  1) Right fifth ray amputation   2) Adjacent tissue transfer 21 cm2  3) Application of wound VAC Pathology/Specimens: ID Type Source Tests Collected by Time Destination  1 : Right 5th Metatarsal Bone/tissue for pathology Tissue PATH Benign ortho SURGICAL PATHOLOGY Evelina Bucy, DPM 10/08/2019 1448   2 : Right 5th Metarsal bone for micro Tissue Bone SURGICAL PATHOLOGY Evelina Bucy, DPM 10/08/2019 1452   3 : Soft tissue foot (right) post lavage for micro Tissue PATH Soft tissue SURGICAL PATHOLOGY Evelina Bucy, DPM 10/08/2019 1455    Anesthesia: MAC Hemostasis:  Total Tourniquet Time Documented: Calf (Right) - 50 minutes Total: Calf (Right) - 50 minutes  Estimated Blood Loss: 50 ml Materials: * No implants in log * Medications: 1g vancomycin powder, topical Complications: none  Indications for Procedure:  This is a 59 y.o. male with a chronic ulcer to the lateral aspect of the foot with osteomyelitis.  It was discussed to benefit from surgical intervention consisting of fifth ray amputation.   Procedure in Detail: Patient was identified in pre-operative holding area. Formal consent was signed and the right lower extremity was marked. Patient was brought back to the operating room. Anesthesia was induced. The extremity was prepped and draped in the usual sterile fashion. Timeout was taken to confirm patient name, laterality, and procedure prior to incision.   Attention was then directed to the lateral aspect of the foot.  There was a large ulcer about the lateral aspect of the foot for which primary closure would not be possible without some sort of tissue rearrangement. The full-thickness of the ulceration was surgically excised  with a 15 blade.  The exposed fifth metatarsal bone underneath the ulceration was biopsied with a rongeur.  This was collected for microbiology.  Excision of the ulcerated area led to a 6x3.5 cm full thickness deficit.  A full-thickness flap was raised off the entirety of the fifth ray and the fifth metatarsal and toe were surgically excised with the overlying skin forming the basis of the flap.  The fifth metatarsal and toe bone were collected for pathology.  The wound was then copiously thoroughly irrigated with 3 L normal saline via pulse lavage.  Post lavage soft tissue culture was taken for microbiology.  The skin flap was then rotated from distal to proximal into position over the deficit and closed with 3-0 Vicryl, 3-0 nylon and skin staples.  The skin at the remainder of the wound was undermined to allow for mobilization.  The wound was then closed in layers with 3-0 Vicryl 3-0 nylon and skin staples.  The wound edges were at this point well coapted.  A customizable dressing for a Prevena wound VAC was then fashioned applied over the wound and set to suction at 31mHg. The foot was then dressed with ACE bandage. Patient tolerated the procedure well.   Disposition: Following a period of post-operative monitoring, patient will be transferred to the floor.  He will benefit from admission for IV antibiotics.  There is believed surgical cure of osteomyelitis as the full extent of the 5th metatarsal was excised.  He will still benefit from a short course of antibiotics for the original soft tissue infection.  We will follow cultures for best antibiotic regimen.

## 2019-10-08 NOTE — H&P (Signed)
  Subjective:  Patient ID: [name], male    DOB: 11/19/1960,  MRN: [id number]  Patient seen in pre-op. Denies new issues. Objective:   Vitals:   10/08/19 1200  BP: 116/67  Pulse: 76  Resp: 18  Temp: 98.3 F (36.8 C)  SpO2: 100%   LE contracture. Dressing intact RLE.  Assessment & Plan:  Patient was evaluated and treated and all questions answered.  Right foot osteomyelitis with chronic ulcer. -Consent reviewed and signed. -To OR today for right 5th ray amputation -RLE marked. -Will get cultures intra-op. -Proceed to OR today.  Park Liter, DPM  Accessible via secure chat for questions or concerns.

## 2019-10-08 NOTE — Consult Note (Signed)
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 Hold anbx 2. Ascertain etiology of his ulcer, future prevention 3. Bear hugger  Comment- With amputation, would consider that he has had source countrol and needs no further anbx at this point.  Will await operative report to assure that he has clean margins.  His prev WOC notes comment that he is WC bound. Will need to take measures in future to prevent recurrence of skin breakdown.    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 male with hx of bipolar, cerebral palsy, and R lateral foot ulcer. He had wound swabs (06-23-19) done which showed MRSA as well as Pseudomonas (pan-sens) at [facility]. He was given courses of doxy and clinda previously which did not improve his ulcer. He also received topical aminoglycosides. He had plain films that were (-).  He was seen in ID clinic 09-06-19 and antibiotics were held. He had a CRP 1.8 and ESR 22 (09-06-19).  He underwent MRI (09-20-19) that showed: 1. Soft tissue ulceration along the lateral midfoot with underlying osteomyelitis of the proximal half of the fifth metatarsal. No abscess. He had further f/u at [facility] and podiatry and today is underwent R 5th ray amputation.  He denies f/c. States he has felt cold. Does believe he had proximal erythema from his wound.   Review of Systems: Review of Systems  Constitutional: Negative for chills and fever.   seen in PACU  Past Medical History:  Diagnosis Date  . Anemia    microcytic anemia   . Anxiety    takes Ativan daily as needed  . Bipolar disorder ([place])    takes Depakote  daily  . CEREBRAL PALSY 07/24/2006  . Chronic bronchitis ([place])    "gets it q yr"  . Chronic bronchitis ([place])   . Chronic diabetic ulcer of foot determined by examination ([place])   . Constipation    takes Miralax daily as needed  . Depression    takes Risperdal nightly  . Environmental allergies    takes Zyrtec daily  . GASTRIC ULCER ACUTE WITH HEMORRHAGE 07/24/2006  . Gastric ulcer with hemorrhage    hx of   . GERD (gastroesophageal reflux disease)    takes Pravacid daily  . History of blood transfusion    no abnormal reaction noted  . History of hiatal hernia   . History of shingles   . History of stomach ulcers ~ 1996  . Hypercholesterolemia   . Intellectual functioning disability   . Intermittent explosive disorder   . Internal hemorrhoids   . MRSA infection 09/06/2019  . Obsessive compulsive disorder   . Osteomyelitis ([place])    right foot   . Pneumonia "quite a few times"   in 2015 and in June 2016  . Pressure ulcer of sacral region    hx of   . Pseudomonas aeruginosa infection 09/06/2019  . Psychotic disorder ([place])   . Recurrent UTI (urinary tract infection)   . Seborrheic dermatitis   .  Sigmoid diverticulosis   . Spastic tetraplegia ([place])   . URINARY INCONTINENCE 02/09/2010   takes Detrol daily-occasionally incontinence  . Vitamin D deficiency   . Weakness    numbness in extremities    Social History   Tobacco Use  . Smoking status: Never Smoker  . Smokeless tobacco: Never Used  Substance Use Topics  . Alcohol use: No  . Drug use: No    Family History  Adopted: Yes     Medications: Scheduled:   Abtx:  Anti-infectives (From admission, onward)   Start     Dose/Rate Route Frequency Ordered Stop   10/08/19 1500  ceFAZolin (ANCEF) IVPB 2g/100 mL premix     2 g 200 mL/hr over 30 Minutes Intravenous  Once 10/08/19 1446 10/08/19 1600   10/08/19 1449  ceFAZolin (ANCEF) 2-4 GM/100ML-% IVPB    Note to Pharmacy: Marchia Meiers   : cabinet override       10/08/19 1449 10/08/19 1530        OBJECTIVE: Blood pressure 116/67, pulse 76, temperature 98.3 F (36.8 C), temperature source Oral, resp. rate 18, height 6' (1.829 m), weight 65.3 kg, SpO2 100 %.  Physical Exam Vitals reviewed.  Constitutional:      General: He is not in acute distress.    Appearance: He is ill-appearing. He is not toxic-appearing.  Cardiovascular:     Rate and Rhythm: Normal rate and regular rhythm.  Pulmonary:     Effort: Pulmonary effort is normal.     Breath sounds: Normal breath sounds.  Abdominal:     General: Bowel sounds are normal. There is distension.     Palpations: Abdomen is soft.     Tenderness: There is no abdominal tenderness. There is no guarding.  Musculoskeletal:     Left lower leg: Edema present.       Legs:     Lab Results Results for orders placed or performed during the hospital encounter of 10/08/19 (from the past 48 hour(s))  Surgical pcr screen     Status: None   Collection Time: 10/08/19 11:32 AM   Specimen: Nasal Mucosa; Nasal Swab  Result Value Ref Range   MRSA, PCR NEGATIVE NEGATIVE   Staphylococcus aureus NEGATIVE NEGATIVE    Comment: (NOTE) The Xpert SA Assay (FDA approved for NASAL specimens in patients 56 years of age and older), is one component of a comprehensive surveillance program. It is not intended to diagnose infection nor to guide or monitor treatment. Performed at [facility], [place] [address] [place],  [address]   SARS Coronavirus 2 by RT PCR (hospital order, performed in [facility] hospital lab) Nasopharyngeal Nasopharyngeal Swab     Status: None   Collection Time: 10/08/19 11:32 AM   Specimen: Nasopharyngeal Swab  Result Value Ref Range   SARS Coronavirus 2 NEGATIVE NEGATIVE    Comment: (NOTE) SARS-CoV-2 target nucleic acids are NOT DETECTED. The SARS-CoV-2 RNA is generally detectable in upper and lower respiratory specimens during the acute phase of infection. The  lowest concentration of SARS-CoV-2 viral copies this assay can detect is 250 copies / mL. A negative result does not preclude SARS-CoV-2 infection and should not be used as the sole basis for treatment or other patient management decisions.  A negative result may occur with improper specimen collection / handling, submission of specimen other than nasopharyngeal swab, presence of viral mutation(s) within the areas targeted by this assay, and inadequate number of viral copies (<250 copies / mL). A negative  result must be combined with clinical observations, patient history, and epidemiological information. Fact Sheet for Patients:   StrictlyIdeas.no Fact Sheet for Healthcare Providers: BankingDealers.co.za This test is not yet approved or cleared  by the [place] FDA and has been authorized for detection and/or diagnosis of SARS-CoV-2 by FDA under an Emergency Use Authorization (EUA).  This EUA will remain in effect (meaning this test can be used) for the duration of the COVID-19 declaration under Section 564(b)(1) of the Act, 21 U.S.C. section 360bbb-3(b)(1), unless the authorization is terminated or revoked sooner. Performed at [facility], [place] [address] [place], [place] [address]   Basic metabolic panel     Status: Abnormal   Collection Time: 10/08/19 12:00 PM  Result Value Ref Range   Sodium 140 135 - 145 mmol/L   Potassium 4.0 3.5 - 5.1 mmol/L   Chloride 101 98 - 111 mmol/L   CO2 28 22 - 32 mmol/L   Glucose, Bld 92 70 - 99 mg/dL    Comment: Glucose reference range applies only to samples taken after fasting for at least 8 hours.   BUN 12 6 - 20 mg/dL   Creatinine, Ser 0.44 (L) 0.61 - 1.24 mg/dL   Calcium 9.3 8.9 - 10.3 mg/dL   GFR calc non Af Amer >60 >60 mL/min   GFR calc Af Amer >60 >60 mL/min   Anion gap 11 5 - 15    Comment: Performed at [facility], [place] [address]  [place], [place] [address]  CBC     Status: Abnormal   Collection Time: 10/08/19 12:00 PM  Result Value Ref Range   WBC 8.1 4.0 - 10.5 K/uL   RBC 5.28 4.22 - 5.81 MIL/uL   Hemoglobin 13.1 13.0 - 17.0 g/dL   HCT 42.3 39.0 - 52.0 %   MCV 80.1 80.0 - 100.0 fL   MCH 24.8 (L) 26.0 - 34.0 pg   MCHC 31.0 30.0 - 36.0 g/dL   RDW 18.6 (H) 11.5 - 15.5 %   Platelets 517 (H) 150 - 400 K/uL   nRBC 0.0 0.0 - 0.2 %    Comment: Performed at [facility], [place] [address], [place], [place] [address]      Component Value Date/Time   SDES  08/18/2019 1200    FOOT RIGHT LATERAL Performed at [facility], [place] [address] [place], [place] [address]    SPECREQUEST  08/18/2019 1200    NONE Performed at [facility], [place] [address], [place], [place] [address]    CULT FEW PSEUDOMONAS AERUGINOSA 08/18/2019 1200   REPTSTATUS 08/20/2019 FINAL 08/18/2019 1200   No results found. Recent Results (from the past 240 hour(s))  Surgical pcr screen     Status: None   Collection Time: 10/08/19 11:32 AM   Specimen: Nasal Mucosa; Nasal Swab  Result Value Ref Range Status   MRSA, PCR NEGATIVE NEGATIVE Final   Staphylococcus aureus NEGATIVE NEGATIVE Final    Comment: (NOTE) The Xpert SA Assay (FDA approved for NASAL specimens in patients 32 years of age and older), is one component of a comprehensive surveillance program. It is not intended to diagnose infection nor to guide or monitor treatment. Performed at [facility], [place] [address] [place], [place] [address]   SARS Coronavirus 2 by RT PCR (hospital order, performed in [facility] hospital lab) Nasopharyngeal Nasopharyngeal Swab     Status: None   Collection Time: 10/08/19 11:32 AM  Specimen: Nasopharyngeal Swab  Result Value Ref Range Status   SARS Coronavirus 2 NEGATIVE NEGATIVE Final    Comment: (NOTE) SARS-CoV-2 target nucleic acids are NOT DETECTED. The SARS-CoV-2 RNA is  generally detectable in upper and lower respiratory specimens during the acute phase of infection. The lowest concentration of SARS-CoV-2 viral copies this assay can detect is 250 copies / mL. A negative result does not preclude SARS-CoV-2 infection and should not be used as the sole basis for treatment or other patient management decisions.  A negative result may occur with improper specimen collection / handling, submission of specimen other than nasopharyngeal swab, presence of viral mutation(s) within the areas targeted by this assay, and inadequate number of viral copies (<250 copies / mL). A negative result must be combined with clinical observations, patient history, and epidemiological information. Fact Sheet for Patients:   StrictlyIdeas.no Fact Sheet for Healthcare Providers: BankingDealers.co.za This test is not yet approved or cleared  by the [place] FDA and has been authorized for detection and/or diagnosis of SARS-CoV-2 by FDA under an Emergency Use Authorization (EUA).  This EUA will remain in effect (meaning this test can be used) for the duration of the COVID-19 declaration under Section 564(b)(1) of the Act, 21 U.S.C. section 360bbb-3(b)(1), unless the authorization is terminated or revoked sooner. Performed at [facility], [place] [address], [place], [place] [address]     Microbiology: Recent Results (from the past 240 hour(s))  Surgical pcr screen     Status: None   Collection Time: 10/08/19 11:32 AM   Specimen: Nasal Mucosa; Nasal Swab  Result Value Ref Range Status   MRSA, PCR NEGATIVE NEGATIVE Final   Staphylococcus aureus NEGATIVE NEGATIVE Final    Comment: (NOTE) The Xpert SA Assay (FDA approved for NASAL specimens in patients 7 years of age and older), is one component of a comprehensive surveillance program. It is not intended to diagnose infection nor to guide or monitor  treatment. Performed at [facility], [place] [address] [place], [place] [address]   SARS Coronavirus 2 by RT PCR (hospital order, performed in [facility] hospital lab) Nasopharyngeal Nasopharyngeal Swab     Status: None   Collection Time: 10/08/19 11:32 AM   Specimen: Nasopharyngeal Swab  Result Value Ref Range Status   SARS Coronavirus 2 NEGATIVE NEGATIVE Final    Comment: (NOTE) SARS-CoV-2 target nucleic acids are NOT DETECTED. The SARS-CoV-2 RNA is generally detectable in upper and lower respiratory specimens during the acute phase of infection. The lowest concentration of SARS-CoV-2 viral copies this assay can detect is 250 copies / mL. A negative result does not preclude SARS-CoV-2 infection and should not be used as the sole basis for treatment or other patient management decisions.  A negative result may occur with improper specimen collection / handling, submission of specimen other than nasopharyngeal swab, presence of viral mutation(s) within the areas targeted by this assay, and inadequate number of viral copies (<250 copies / mL). A negative result must be combined with clinical observations, patient history, and epidemiological information. Fact Sheet for Patients:   StrictlyIdeas.no Fact Sheet for Healthcare Providers: BankingDealers.co.za This test is not yet approved or cleared  by the [place] FDA and has been authorized for detection and/or diagnosis of SARS-CoV-2 by FDA under an Emergency Use Authorization (EUA).  This EUA will remain in effect (meaning this test can be used) for the duration of the COVID-19 declaration under Section 564(b)(1) of the Act, 21 U.S.C. section  360bbb-3(b)(1), unless the authorization is terminated or revoked sooner. Performed at [facility], [place] [address] [place], [place] [address]     Radiographs and labs were personally reviewed by me.    Bobby Rumpf, MD [facility] for Infectious Disease [facility] [phone] 10/08/2019, 4:15 PM

## 2019-10-08 NOTE — Anesthesia Procedure Notes (Signed)
Date/Time: 10/08/2019 3:01 PM Performed by: Thornell Mule, CRNA Oxygen Delivery Method: Simple face mask

## 2019-10-08 NOTE — Transfer of Care (Signed)
Immediate Anesthesia Transfer of Care Note  Patient: [name]  Procedure(s) Performed: RIGHT FIFTH RAY AMPUTATION, ADJACENT TISSUE TRANSFER (Right )  Patient Location: PACU  Anesthesia Type:MAC  Level of Consciousness: drowsy and patient cooperative  Airway & Oxygen Therapy: Patient Spontanous Breathing and Patient connected to face mask oxygen  Post-op Assessment: Report given to RN and Post -op Vital signs reviewed and stable  Post vital signs: Reviewed and stable  Last Vitals:  Vitals Value Taken Time  BP 135/99 10/08/19 1642  Temp    Pulse 65 10/08/19 1644  Resp 11 10/08/19 1644  SpO2 100 % 10/08/19 1644  Vitals shown include unvalidated device data.  Last Pain:  Vitals:   10/08/19 1200  TempSrc: Oral         Complications: No apparent anesthesia complications

## 2019-10-08 NOTE — Anesthesia Postprocedure Evaluation (Signed)
Anesthesia Post Note  Patient: [name]  Procedure(s) Performed: RIGHT FIFTH RAY AMPUTATION, ADJACENT TISSUE TRANSFER (Right )     Patient location during evaluation: PACU Anesthesia Type: MAC Level of consciousness: patient cooperative and awake Pain management: pain level controlled Vital Signs Assessment: post-procedure vital signs reviewed and stable Respiratory status: spontaneous breathing, nonlabored ventilation, respiratory function stable and patient connected to nasal cannula oxygen Cardiovascular status: stable and blood pressure returned to baseline Postop Assessment: no apparent nausea or vomiting Anesthetic complications: no    Last Vitals:  Vitals:   10/08/19 1805 10/08/19 2036  BP: (!) 146/91 (!) 119/59  Pulse: 62 85  Resp: 14 14  Temp: 36.5 C 36.5 C  SpO2: 100% 100%    Last Pain:  Vitals:   10/08/19 1805  TempSrc: Oral  PainSc: 0-No pain                 Tierre Netto

## 2019-10-08 NOTE — H&P (Signed)
Anesthesia H&P Update: History and Physical Exam reviewed; patient is OK for planned anesthetic and procedure. ? ?

## 2019-10-08 NOTE — Progress Notes (Signed)
Pharmacy Antibiotic Note  [name] is a 59 y.o. male admitted on 10/08/2019 with osteomyelitis.  Pharmacy has been consulted for vancomycin and cefepime dosing.  Plan: Vancomycin 1000 IV every 12 hours.  Goal trough 15-20 mcg/mL. predicted AUC 448 using SCr 0.8  Cefepime 2 g iv q 8 hours  F/U culture results, renal function, levels as indicated  Height: 6' (182.9 cm) Weight: 65.3 kg (144 lb) IBW/kg (Calculated) : 77.6  Temp (24hrs), Avg:96.4 F (35.8 C), Min:94.3 F (34.6 C), Max:98.3 F (36.8 C)  Recent Labs  Lab 10/08/19 1200  WBC 8.1  CREATININE 0.44*    Estimated Creatinine Clearance: 91.8 mL/min (A) (by C-G formula based on SCr of 0.44 mg/dL (L)).    Allergies  Allergen Reactions  . Adhesive [Tape] Rash     Thank you for allowing pharmacy to be a part of this patient's care.  Luisa Hart D 10/08/2019 6:15 PM

## 2019-10-08 NOTE — H&P (Signed)
.  History and Physical    [name] OBS:[id number] DOB: [birth year] DOA: 10/08/2019  PCP: Joana Reamer, DO  Patient coming from: SNF   Chief Complaint: Osteomyelitis  HPI: [name] is a 59 y.o. male with medical history significant of cerebral palsy and osteomyelitis. Patient was taken to OR for right 5th ray amputation today for osteomyelitis. Patient was reviewed with ID who recommended admission for IV abx. Patient is unable to give history. [facility] was called for admission.   Review of Systems: He denies CP, N, V, ab pain. He reports hunger.  Remainder of 10 point review of systems is otherwise negative for all not mentioned in HPI.    Past Medical History:  Diagnosis Date  . Anemia    microcytic anemia   . Anxiety    takes Ativan daily as needed  . Bipolar disorder (HCC)    takes Depakote daily  . CEREBRAL PALSY 07/24/2006  . Chronic bronchitis (HCC)    "gets it q yr"  . Chronic bronchitis (HCC)   . 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)   . Constipation    takes Miralax daily as needed  . Depression    takes Risperdal nightly  . Environmental allergies    takes Zyrtec daily  . GASTRIC ULCER ACUTE WITH HEMORRHAGE 07/24/2006  . Gastric ulcer with hemorrhage    hx of   . GERD (gastroesophageal reflux disease)    takes Pravacid daily  . History of blood transfusion    no abnormal reaction noted  . History of hiatal hernia   . History of shingles   . History of stomach ulcers ~ 1996  . Hypercholesterolemia   . Intellectual functioning disability   . Intermittent explosive disorder   . Internal hemorrhoids   . MRSA infection 09/06/2019  . Obsessive compulsive disorder   . Osteomyelitis (HCC)    right foot   . Pneumonia "quite a few times"   in 2015 and in June 2016  . Pressure ulcer of sacral region    hx of   . Pseudomonas aeruginosa infection 09/06/2019  . Psychotic disorder (HCC)   . Recurrent UTI (urinary tract infection)    . Seborrheic dermatitis   . Sigmoid diverticulosis   . Spastic tetraplegia (HCC)   . URINARY INCONTINENCE 02/09/2010   takes Detrol daily-occasionally incontinence  . Vitamin D deficiency   . Weakness    numbness in extremities    Past Surgical History:  Procedure Laterality Date  . AMPUTATION Right 12/14/2014   Procedure: AMPUTATION DIGIT RIGHT GREAT TOE MTP;  Surgeon: Nadara Mustard, MD;  Location: [facility] OR;  Service: Orthopedics;  Laterality: Right;  . BIOPSY  08/12/2018   Procedure: BIOPSY;  Surgeon: Lynann Bologna, MD;  Location: [facility];  Service: Gastroenterology;;  . COLONOSCOPY WITH PROPOFOL N/A 08/14/2018   Procedure: COLONOSCOPY WITH PROPOFOL;  Surgeon: Lynann Bologna, MD;  Location: [facility];  Service: Gastroenterology;  Laterality: N/A;  . ESOPHAGOGASTRODUODENOSCOPY (EGD) WITH PROPOFOL N/A 08/12/2018   Procedure: ESOPHAGOGASTRODUODENOSCOPY (EGD) WITH PROPOFOL;  Surgeon: Lynann Bologna, MD;  Location: [facility];  Service: Gastroenterology;  Laterality: N/A;  . head surgery  as a baby   due to "water head"   . HERNIA REPAIR    . HIATAL HERNIA REPAIR  1982  . LEG SURGERY Bilateral ~ 1967   "legs were scissored; stretched them out"     reports that he has never smoked. He has never used smokeless tobacco.  He reports that he does not drink alcohol or use drugs.  Allergies  Allergen Reactions  . Adhesive [Tape] Rash    Family History  Adopted: Yes    Prior to Admission medications   Medication Sig Start Date End Date Taking? Authorizing Provider  baclofen (LIORESAL) 20 MG tablet Take 1 tablet (20 mg total) by mouth 3 (three) times daily. 07/18/16  Yes Kirsteins, Luanna Salk, MD  brompheniramine-pseudoephedrine (DIMETAPP) 1-15 MG/5ML ELIX Take 10 mLs by mouth as needed.    Yes [provider]  cetirizine (ZYRTEC) 10 MG tablet Take 1 tablet (10 mg total) by mouth daily. Patient taking differently: Take 10 mg by mouth at bedtime.  11/01/14  Yes Street,  Sharon Mt, MD  Cholecalciferol (VITAMIN D3) 5000 units CAPS Take 5,000 Units by mouth daily.   Yes [provider]  divalproex (DEPAKOTE SPRINKLE) 125 MG capsule Take 500 mg by mouth 2 (two) times daily.    Yes [provider]  hydrocortisone valerate cream (WESTCORT) 0.2 % Apply 1 application topically 2 (two) times daily.   Yes [provider]  iron polysaccharides (NIFEREX) 150 MG capsule Take 150 mg by mouth daily.   Yes [provider]  ketoconazole (NIZORAL) 2 % shampoo APPLY TOPICALLY TO AFFECTED AREA(S) TWICE WEEKLY AS DIRECTED Patient taking differently: Apply 1 application topically daily.  10/22/17  Yes Carlyle Dolly, MD  omeprazole (PRILOSEC) 40 MG capsule Take 1 capsule (40 mg total) by mouth 2 (two) times daily. 08/15/18  Yes Eugenie Filler, MD  Oyster Shell (OYSTER CALCIUM) 500 MG TABS tablet Take 1,500 mg of elemental calcium by mouth daily.   Yes [provider]  risperiDONE (RISPERDAL) 2 MG tablet Take 2 mg by mouth at bedtime.   Yes [provider]  tamsulosin (FLOMAX) 0.4 MG CAPS capsule TAKE 1 CAPSULE BY MOUTH EVERY DAY *DO NOT CRUSH* Patient taking differently: Take 0.4 mg by mouth daily.  10/01/18  Yes Mullis, Kiersten P, DO  tiZANidine (ZANAFLEX) 2 MG tablet Take 2 mg by mouth 3 (three) times daily.  07/22/18  Yes [provider]  tolterodine (DETROL LA) 2 MG 24 hr capsule Take 1 capsule (2 mg total) by mouth daily. 04/08/13  Yes Street, Sharon Mt, MD  tretinoin (RETIN-A) 0.025 % gel Apply 1 application topically at bedtime.   Yes [provider]  vitamin C (VITAMIN C) 250 MG tablet Take 1 tablet (250 mg total) by mouth daily. 01/18/16  Yes Smiley Houseman, MD  VOLTAREN 1 % GEL APPLY 4 GRAMS TOPICALLY TO AFFECTED AREA(S) FOUR TIMES DAILY Patient taking differently: Apply 2 g topically 4 (four) times daily.  06/16/19  Yes Mullis, Kiersten P, DO  acetaminophen (TYLENOL) 500 MG tablet Take  1,000 mg by mouth every 6 (six) hours as needed for moderate pain.    [provider]  alum & mag hydroxide-simeth (MYLANTA) 200-200-20 MG/5ML suspension Take 30 mLs by mouth every 6 (six) hours as needed for indigestion or heartburn. Use as directed as needed for nausea/indigestion per standing order    [provider]  clotrimazole (LOTRIMIN) 1 % cream Apply 1 application topically 2 (two) times daily.    [provider]  EAR DROPS EARWAX AID 6.5 % OTIC solution PLACE 5 DROPS INTO THE LEFT EAR TWICE DAILY Patient taking differently: Place 5 drops into the left ear 2 (two) times daily.  04/21/18   Mullis, Archie Endo, DO  Elastic Bandages & Supports (JOBST ANTI-EM KNEE HIGH  MED) MISC by Does not apply route. Apply once in the morning and remove at bedtime daily    [provider]  Foot Care Products (PREVALON HEEL PROTECTOR) MISC 1 Device by Does not apply route daily. 06/20/15   Marquette Saa, MD  guaiFENesin-dextromethorphan [facility] DM) 100-10 MG/5ML syrup Take 10 mLs by mouth every 4 (four) hours as needed for cough.     [provider]  hydrocortisone cream 1 % Apply 1 application topically at bedtime as needed for itching. 06/03/19   Mullis, Kiersten P, DO  LORazepam (ATIVAN) 2 MG tablet Take 1 mg by mouth every 4 (four) hours as needed for anxiety.     [provider]  Neomycin-Bacitracin-Polymyxin (TRIPLE ANTIBIOTIC EX) Apply 1 application topically as needed (cuts/scrapes/scratches).    [provider]  polyethylene glycol (MIRALAX) 17 g packet Take 17g by mouth daily as needed. Please do not give if having soft, regular bowel movements. Patient taking differently: Take 17 g by mouth daily as needed for moderate constipation.  04/12/19   Myrene Buddy, MD  Zinc Oxide 40 % PSTE Apply thin layer topically to buttocks and scrotum BID as needed. Patient taking differently: Apply 1 application topically 2 (two) times daily  as needed.  06/04/19   Joana Reamer, DO    Physical Exam: Vitals:   10/08/19 1642 10/08/19 1645 10/08/19 1700 10/08/19 1715  BP: (!) 135/99 (!) 127/91 (!) 136/95 (!) 143/81  Pulse: 68 66 65 67  Resp: 10 12 11 10   Temp:  (!) 94.3 F (34.6 C)  (!) 94.4 F (34.7 C)  TempSrc:      SpO2: 100% 100% 100% 99%  Weight:      Height:        Constitutional: 59 y.o. male NAD, calm, comfortable Vitals:   10/08/19 1642 10/08/19 1645 10/08/19 1700 10/08/19 1715  BP: (!) 135/99 (!) 127/91 (!) 136/95 (!) 143/81  Pulse: 68 66 65 67  Resp: 10 12 11 10   Temp:  (!) 94.3 F (34.6 C)  (!) 94.4 F (34.7 C)  TempSrc:      SpO2: 100% 100% 100% 99%  Weight:      Height:       General: 59 y.o. male resting in bed in NAD Eyes: PERRL, normal sclera ENMT: Nares patent w/o discharge, orophaynx clear, dentition normal, ears w/o discharge/lesions/ulcers Cardiovascular: RRR, +S1, S2, no m/g/r, equal pulses throughout Respiratory: CTABL, no w/r/r, normal WOB GI: BS+, NDNT, no masses noted, no organomegaly noted MSK: No e/c/c, RLE bandaging CDI Neuro: A&O to name and place, no focality on exam, some contracture Psyc: calm/cooperative  Labs on Admission: I have personally reviewed following labs and imaging studies  CBC: Recent Labs  Lab 10/08/19 1200  WBC 8.1  HGB 13.1  HCT 42.3  MCV 80.1  PLT 517*   Basic Metabolic Panel: Recent Labs  Lab 10/08/19 1200  NA 140  K 4.0  CL 101  CO2 28  GLUCOSE 92  BUN 12  CREATININE 0.44*  CALCIUM 9.3   GFR: Estimated Creatinine Clearance: 91.8 mL/min (A) (by C-G formula based on SCr of 0.44 mg/dL (L)). Liver Function Tests: No results for input(s): AST, ALT, ALKPHOS, BILITOT, PROT, ALBUMIN in the last 168 hours. No results for input(s): LIPASE, AMYLASE in the last 168 hours. No results for input(s): AMMONIA in the last 168 hours. Coagulation Profile: No results for input(s): INR, PROTIME in the last 168 hours. Cardiac Enzymes: No results for  input(s):  CKTOTAL, CKMB, CKMBINDEX, TROPONINI in the last 168 hours. BNP (last 3 results) No results for input(s): PROBNP in the last 8760 hours. HbA1C: No results for input(s): HGBA1C in the last 72 hours. CBG: No results for input(s): GLUCAP in the last 168 hours. Lipid Profile: No results for input(s): CHOL, HDL, LDLCALC, TRIG, CHOLHDL, LDLDIRECT in the last 72 hours. Thyroid Function Tests: No results for input(s): TSH, T4TOTAL, FREET4, T3FREE, THYROIDAB in the last 72 hours. Anemia Panel: No results for input(s): VITAMINB12, FOLATE, FERRITIN, TIBC, IRON, RETICCTPCT in the last 72 hours. Urine analysis:    Component Value Date/Time   COLORURINE YELLOW 04/08/2019 1630   APPEARANCEUR HAZY (A) 04/08/2019 1630   LABSPEC 1.026 04/08/2019 1630   PHURINE 5.0 04/08/2019 1630   GLUCOSEU NEGATIVE 04/08/2019 1630   HGBUR NEGATIVE 04/08/2019 1630   HGBUR negative 06/14/2010 1119   BILIRUBINUR NEGATIVE 04/08/2019 1630   BILIRUBINUR SMALL 01/04/2016 1634   KETONESUR NEGATIVE 04/08/2019 1630   PROTEINUR 30 (A) 04/08/2019 1630   UROBILINOGEN 2.0 (H) 02/05/2018 1825   NITRITE NEGATIVE 04/08/2019 1630   LEUKOCYTESUR NEGATIVE 04/08/2019 1630    Radiological Exams on Admission: No results found.  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)   Acute osteomyelitis of right ankle or foot (HCC)   Ulcerated, foot, right, with necrosis of bone (HCC)   Osteomyelitis (HCC)   Osteomyelitis of the right foot     - admit to inpt med-surg for IV abx     - start vanc, cefepime (rec'd by ID)     - Cx pending; will narrow as data becomes available  Cerebral palsy     - continue depakote, resperidone, PRN ativan  GERD     - PPI  Chronic spasm     - baclofen  DVT prophylaxis: SCDs  Code Status: FULL   Disposition Plan: Admit to inpatient  Consults called: ID called by DPM  Admission status: Inpatient  Status is: Inpatient  Remains inpatient appropriate  because:IV treatments appropriate due to intensity of illness or inability to take PO   Dispo: The patient is from: SNF              Anticipated d/c is to: SNF              Anticipated d/c date is: 2 days              Patient currently is not medically stable to d/c.   Teddy Spike DO [facility]  If 7PM-7AM, please contact night-coverage [email or web address]  10/08/2019, 5:33 PM

## 2019-10-09 ENCOUNTER — Telehealth: Payer: Self-pay | Admitting: Podiatry

## 2019-10-09 DIAGNOSIS — S98131A Complete traumatic amputation of one right lesser toe, initial encounter: Secondary | ICD-10-CM | POA: Diagnosis not present

## 2019-10-09 DIAGNOSIS — K219 Gastro-esophageal reflux disease without esophagitis: Secondary | ICD-10-CM | POA: Diagnosis not present

## 2019-10-09 DIAGNOSIS — M86171 Other acute osteomyelitis, right ankle and foot: Secondary | ICD-10-CM | POA: Diagnosis not present

## 2019-10-09 DIAGNOSIS — G809 Cerebral palsy, unspecified: Secondary | ICD-10-CM | POA: Diagnosis not present

## 2019-10-09 LAB — COMPREHENSIVE METABOLIC PANEL
ALT: 24 U/L (ref 0–44)
AST: 24 U/L (ref 15–41)
Albumin: 2.7 g/dL — ABNORMAL LOW (ref 3.5–5.0)
Alkaline Phosphatase: 139 U/L — ABNORMAL HIGH (ref 38–126)
Anion gap: 7 (ref 5–15)
BUN: 12 mg/dL (ref 6–20)
CO2: 25 mmol/L (ref 22–32)
Calcium: 8.7 mg/dL — ABNORMAL LOW (ref 8.9–10.3)
Chloride: 106 mmol/L (ref 98–111)
Creatinine, Ser: 0.42 mg/dL — ABNORMAL LOW (ref 0.61–1.24)
GFR calc Af Amer: >60 mL/min (ref >60)
GFR calc non Af Amer: >60 mL/min (ref >60)
Glucose, Bld: 89 mg/dL (ref 70–99)
Potassium: 3.8 mmol/L (ref 3.5–5.1)
Sodium: 138 mmol/L (ref 135–145)
Total Bilirubin: 0.4 mg/dL (ref 0.3–1.2)
Total Protein: 6 g/dL — ABNORMAL LOW (ref 6.5–8.1)

## 2019-10-09 LAB — CBC
HCT: 34.5 % — ABNORMAL LOW (ref 39.0–52.0)
Hemoglobin: 10.9 g/dL — ABNORMAL LOW (ref 13.0–17.0)
MCH: 24.8 pg — ABNORMAL LOW (ref 26.0–34.0)
MCHC: 31.6 g/dL (ref 30.0–36.0)
MCV: 78.6 fL — ABNORMAL LOW (ref 80.0–100.0)
Platelets: 465 10*3/uL — ABNORMAL HIGH (ref 150–400)
RBC: 4.39 MIL/uL (ref 4.22–5.81)
RDW: 17.7 % — ABNORMAL HIGH (ref 11.5–15.5)
WBC: 10.3 10*3/uL (ref 4.0–10.5)
nRBC: 0 % (ref 0.0–0.2)

## 2019-10-09 LAB — HIV ANTIBODY (ROUTINE TESTING W REFLEX): HIV Screen 4th Generation wRfx: NONREACTIVE

## 2019-10-09 LAB — MAGNESIUM: Magnesium: 2 mg/dL (ref 1.7–2.4)

## 2019-10-09 MED ORDER — POLYETHYLENE GLYCOL 3350 17 G PO PACK: 17.0000 g | PACK | Freq: Every day | ORAL | Status: DC | PRN

## 2019-10-09 MED ORDER — POVIDONE-IODINE 10 % EX SOLN
CUTANEOUS | Status: DC | PRN
  Filled 2019-10-09 (×2): qty 118

## 2019-10-09 MED ORDER — HYDROCODONE-ACETAMINOPHEN 5-325 MG PO TABS: 1.0000 | ORAL_TABLET | Freq: Four times a day (QID) | ORAL | 0 refills | Status: AC | PRN

## 2019-10-09 MED ORDER — HYDROCODONE-ACETAMINOPHEN 5-325 MG PO TABS: 1.0000 | ORAL_TABLET | Freq: Four times a day (QID) | ORAL | 0 refills | Status: DC | PRN

## 2019-10-09 NOTE — TOC Initial Note (Signed)
Transition of Care [facility]) - Initial/Assessment Note    Patient Details  Name: [name] MRN: [id number] Date of Birth: 07/12/1960  Transition of Care [facility]) CM/SW Contact:    [name], [place] Phone Number: 10/09/2019, 12:40 PM  Clinical Narrative:                 CSW spoke with the patient [name] [phone], she states, the patient will return to [place] and provided contact information for the supervisor [name] [phone]. Patient is non ambulatory, uses a power wheelchair and is maximum assist with all ADL's. CSW faxed FL2 and d/c summary to [phone]. The Group Home will transport the patient. CSW notified the nurse staff.  Expected Discharge Plan: Group Home Barriers to Discharge: No Barriers Identified   Patient Goals and CMS Choice     Choice offered to / list presented to : NA  Expected Discharge Plan and Services Expected Discharge Plan: Group Home In-house Referral: NA Discharge Planning Services: NA Post Acute Care Choice: NA Living arrangements for the past 2 months: Group Home Expected Discharge Date: 10/09/19               DME Arranged: N/A DME Agency: NA         HH Agency: NA        Prior Living Arrangements/Services Living arrangements for the past 2 months: Group Home Lives with:: Facility Resident Patient language and need for interpreter reviewed:: No        Need for Family Participation in Patient Care: Yes (Comment) Care giver support system in place?: Yes (comment)   Criminal Activity/Legal Involvement Pertinent to Current Situation/Hospitalization: No - Comment as needed  Activities of Daily Living Home Assistive Devices/Equipment: Other (Comment)(electric wheelchair. Resides at group home) ADL Screening (condition at time of admission) Patient's cognitive ability adequate to safely complete daily activities?: Yes Is the patient deaf or have difficulty hearing?: No Does the  patient have difficulty seeing, even when wearing glasses/contacts?: No Does the patient have difficulty concentrating, remembering, or making decisions?: No Patient able to express need for assistance with ADLs?: Yes Does the patient have difficulty dressing or bathing?: Yes Independently performs ADLs?: No Communication: Independent Dressing (OT): Dependent Is this a change from baseline?: Pre-admission baseline Grooming: Dependent Is this a change from baseline?: Pre-admission baseline Feeding: Needs assistance Is this a change from baseline?: Pre-admission baseline Bathing: Dependent Is this a change from baseline?: Pre-admission baseline Toileting: Dependent Is this a change from baseline?: Pre-admission baseline In/Out Bed: Dependent Is this a change from baseline?: Pre-admission baseline Walks in Home: (Unable) Does the patient have difficulty walking or climbing stairs?: (wheelchair bound) Weakness of Legs: Both Weakness of Arms/Hands: Both  Permission Sought/Granted      Share Information with NAMEAnnamaria Helling [phone], [phone],  [phone]  Permission granted to share info w AGENCY: [facility] [phone]  Permission granted to share info w Relationship: Sister     Emotional Assessment   Attitude/Demeanor/Rapport: Unable to Assess Affect (typically observed): Unable to Assess   Alcohol / Substance Use: Not Applicable Psych Involvement: No (comment)  Admission diagnosis:  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 ([place]) [S98.131A] Osteomyelitis ([place]) [M86.9] Patient Active Problem List   Diagnosis Date Noted  . 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 ([place]) 10/08/2019  . Osteomyelitis ([place]) 10/08/2019  . Acute osteomyelitis of right ankle or foot ([place])   . Ulcerated, foot, right, with necrosis of bone (  HCC)   . Pseudomonas aeruginosa infection 09/06/2019  . MRSA infection 09/06/2019  . Lactic acidosis   . Pressure injury of  skin 08/10/2018  . Gastric wall thickening 08/10/2018  . Alkaline phosphatase elevation 08/10/2018  . Cholelithiasis 08/10/2018  . Microcytic anemia   . Hiatal hernia 04/07/2018  . 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 Quadriplegia (HCC) 12/15/2015  . Cerebral palsy, quadriplegic (HCC)   . Pre-diabetes 08/31/2015  . Foot ulcer, limited to breakdown of skin (HCC) 06/08/2015  . Seasonal allergies 11/01/2014  . Pressure ulcer of foot 03/14/2014  . Pressure ulcer 01/08/2013  . URINARY INCONTINENCE 02/09/2010  . Mental retardation 07/24/2006  . Infantile cerebral palsy (HCC) 07/24/2006   PCP:  Joana Reamer, DO Pharmacy:   [facility] [place], [place] - [address] [place] [address] Phone: [phone] Fax: [phone]     Social Determinants of Health (SDOH) Interventions    Readmission Risk Interventions No flowsheet data found.

## 2019-10-09 NOTE — NC FL2 (Signed)
[place] LEVEL OF CARE SCREENING TOOL     IDENTIFICATION  Patient Name: [name] Birthdate: 1960/12/06 Sex: male Admission Date (Current Location): 10/08/2019  [facility] and [place] Number:  Herbalist and Address:  [facility],  [place], [place]      Provider Number: [id number]  Attending Physician Name and Address:  Jonnie Finner, DO  Relative Name and Phone Number:    [name] [phone]                                      [phone]      Current Level of Care: Hospital Recommended Level of Care: Group Home Prior Approval Number:    Date Approved/Denied:   PASRR Number:      Discharge Plan: Home    Current Diagnoses: Patient Active Problem List   Diagnosis Date Noted  . 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 ([place]) 10/08/2019  . Osteomyelitis ([place]) 10/08/2019  . Acute osteomyelitis of right ankle or foot ([place])   . Ulcerated, foot, right, with necrosis of bone ([place])   . Pseudomonas aeruginosa infection 09/06/2019  . MRSA infection 09/06/2019  . Lactic acidosis   . Pressure injury of skin 08/10/2018  . Gastric wall thickening 08/10/2018  . Alkaline phosphatase elevation 08/10/2018  . Cholelithiasis 08/10/2018  . Microcytic anemia   . Hiatal hernia 04/07/2018  . 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 Quadriplegia ([place]) 12/15/2015  . Cerebral palsy, quadriplegic ([place])   . Pre-diabetes 08/31/2015  . Foot ulcer, limited to breakdown of skin ([place]) 06/08/2015  . Seasonal allergies 11/01/2014  . Pressure ulcer of foot 03/14/2014  . Pressure ulcer 01/08/2013  . URINARY INCONTINENCE 02/09/2010  . Mental retardation 07/24/2006  . Infantile cerebral palsy ([place]) 07/24/2006    Orientation RESPIRATION BLADDER Height & Weight     Self, Time, Situation, Place  Normal Incontinent Weight: 144 lb (65.3 kg) Height:  6' (182.9 cm)  BEHAVIORAL  SYMPTOMS/MOOD NEUROLOGICAL BOWEL NUTRITION STATUS      Incontinent Diet(Regular)  AMBULATORY STATUS COMMUNICATION OF NEEDS Skin   Total Care Verbally Surgical wounds                       Personal Care Assistance Level of Assistance  Bathing, Dressing, Feeding Bathing Assistance: Maximum assistance Feeding assistance: Maximum assistance Dressing Assistance: Maximum assistance     Functional Limitations Info  Sight, Speech, Hearing Sight Info: Adequate Hearing Info: Adequate Speech Info: Adequate    SPECIAL CARE FACTORS FREQUENCY                       Contractures Contractures Info: Present    Additional Factors Info  Code Status, Allergies, Psychotropic Code Status Info: Fullcode Allergies Info: Allergies: Adhesive Tape Psychotropic Info: Depakote, Risperdal         Current Medications (10/09/2019):  This is the current hospital active medication list Current Facility-Administered Medications  Medication Dose Route Frequency Provider Last Rate Last Admin  . acetaminophen (TYLENOL) tablet 650 mg  650 mg Oral Q6H PRN Marylyn Ishihara, Tyrone A, DO       Or  . acetaminophen (TYLENOL) suppository 650 mg  650 mg Rectal Q6H PRN Marylyn Ishihara, Tyrone A, DO      . alum & mag hydroxide-simeth (MAALOX/MYLANTA) 200-200-20 MG/5ML suspension  30 mL  30 mL Oral Q6H PRN Ronaldo Miyamoto, Tyrone A, DO      . baclofen (LIORESAL) tablet 20 mg  20 mg Oral TID Ronaldo Miyamoto, Tyrone A, DO   20 mg at 10/09/19 0849  . ceFEPIme (MAXIPIME) 2 g in sodium chloride 0.9 % 100 mL IVPB  2 g Intravenous Q8H Kyle, Tyrone A, DO 200 mL/hr at 10/09/19 1013 2 g at 10/09/19 1013  . divalproex (DEPAKOTE SPRINKLE) capsule 500 mg  500 mg Oral BID Ronaldo Miyamoto, Tyrone A, DO   500 mg at 10/09/19 0853  . fesoterodine (TOVIAZ) tablet 4 mg  4 mg Oral Daily Kyle, Tyrone A, DO   4 mg at 10/09/19 0852  . guaiFENesin-dextromethorphan (ROBITUSSIN DM) 100-10 MG/5ML syrup 10 mL  10 mL Oral Q4H PRN Ronaldo Miyamoto, Tyrone A, DO      . HYDROcodone-acetaminophen  (NORCO/VICODIN) 5-325 MG per tablet 1-2 tablet  1-2 tablet Oral Q4H PRN Margie Ege A, DO   1 tablet at 10/09/19 0903  . iron polysaccharides (NIFEREX) capsule 150 mg  150 mg Oral Daily Kyle, Tyrone A, DO   150 mg at 10/09/19 0848  . LORazepam (ATIVAN) tablet 1 mg  1 mg Oral Q6H PRN Ronaldo Miyamoto, Tyrone A, DO      . ondansetron (ZOFRAN) tablet 4 mg  4 mg Oral Q6H PRN Ronaldo Miyamoto, Tyrone A, DO       Or  . ondansetron (ZOFRAN) injection 4 mg  4 mg Intravenous Q6H PRN Ronaldo Miyamoto, Tyrone A, DO      . pantoprazole (PROTONIX) EC tablet 40 mg  40 mg Oral Daily Kyle, Tyrone A, DO   40 mg at 10/09/19 0849  . polyethylene glycol (MIRALAX / GLYCOLAX) packet 17 g  17 g Oral Daily PRN Ronaldo Miyamoto, Tyrone A, DO      . povidone-iodine (BETADINE) 10 % external solution   Topical PRN Park Liter, DPM      . risperiDONE (RISPERDAL) tablet 2 mg  2 mg Oral QHS Kyle, Tyrone A, DO   2 mg at 10/08/19 2214  . tamsulosin (FLOMAX) capsule 0.4 mg  0.4 mg Oral Daily Kyle, Tyrone A, DO   0.4 mg at 10/09/19 0848  . vancomycin (VANCOCIN) IVPB 1000 mg/200 mL premix  1,000 mg Intravenous Q12H Kyle, Tyrone A, DO   Stopped at 10/09/19 0100     Discharge Medications: Please see discharge summary for a list of discharge medications.  Relevant Imaging Results:  Relevant Lab Results:   Additional Information DISCHARGE MEDICATIONS:   Discharge Instructions       Allergies as of 10/09/2019      Reactions   Adhesive [tape] Rash         Medication List    TAKE these medications   acetaminophen 500 MG tablet Commonly known as: TYLENOL Take 1,000 mg by mouth every 6 (six) hours as needed for moderate pain.   ascorbic acid 250 MG tablet Commonly known as: VITAMIN C Take 1 tablet (250 mg total) by mouth daily.   baclofen 20 MG tablet Commonly known as: LIORESAL Take 1 tablet (20 mg total) by mouth 3 (three) times daily.   brompheniramine-pseudoephedrine 1-15 MG/5ML Elix Commonly known as: DIMETAPP Take 10 mLs by mouth as  needed.   cetirizine 10 MG tablet Commonly known as: ZYRTEC Take 1 tablet (10 mg total) by mouth daily. What changed: when to take this   clotrimazole 1 % cream Commonly known as: LOTRIMIN Apply 1 application topically 2 (two) times daily.  divalproex 125 MG capsule Commonly known as: DEPAKOTE SPRINKLE Take 500 mg by mouth 2 (two) times daily.   Ear Drops Earwax Aid 6.5 % OTIC solution Generic drug: carbamide peroxide PLACE 5 DROPS INTO THE LEFT EAR TWICE DAILY What changed: See the new instructions.   guaiFENesin-dextromethorphan 100-10 MG/5ML syrup Commonly known as: ROBITUSSIN DM Take 10 mLs by mouth every 4 (four) hours as needed for cough.   HYDROcodone-acetaminophen 5-325 MG tablet Commonly known as: NORCO/VICODIN Take 1 tablet by mouth every 6 (six) hours as needed for up to 5 days for moderate pain.   hydrocortisone cream 1 % Apply 1 application topically at bedtime as needed for itching.   hydrocortisone valerate cream 0.2 % Commonly known as: WESTCORT Apply 1 application topically 2 (two) times daily.   iron polysaccharides 150 MG capsule Commonly known as: NIFEREX Take 150 mg by mouth daily.   Jobst Anti-Em Knee High Med Misc by Does not apply route. Apply once in the morning and remove at bedtime daily   ketoconazole 2 % shampoo Commonly known as: NIZORAL APPLY TOPICALLY TO AFFECTED AREA(S) TWICE WEEKLY AS DIRECTED What changed: See the new instructions.   LORazepam 2 MG tablet Commonly known as: ATIVAN Take 1 mg by mouth every 4 (four) hours as needed for anxiety.   Mylanta 200-200-20 MG/5ML suspension Generic drug: alum & mag hydroxide-simeth Take 30 mLs by mouth every 6 (six) hours as needed for indigestion or heartburn. Use as directed as needed for nausea/indigestion per standing order   omeprazole 40 MG capsule Commonly known as: PRILOSEC Take 1 capsule (40 mg total) by mouth 2 (two) times daily.   oyster calcium 500 MG Tabs  tablet Take 1,500 mg of elemental calcium by mouth daily.   polyethylene glycol 17 g packet Commonly known as: MiraLax Take 17 g by mouth daily as needed for moderate constipation.   Prevalon Heel Protector Misc 1 Device by Does not apply route daily.   risperiDONE 2 MG tablet Commonly known as: RISPERDAL Take 2 mg by mouth at bedtime.   tamsulosin 0.4 MG Caps capsule Commonly known as: FLOMAX TAKE 1 CAPSULE BY MOUTH EVERY DAY *DO NOT CRUSH* What changed: See the new instructions.   tiZANidine 2 MG tablet Commonly known as: ZANAFLEX Take 2 mg by mouth 3 (three) times daily.   tolterodine 2 MG 24 hr capsule Commonly known as: Detrol LA Take 1 capsule (2 mg total) by mouth daily.   tretinoin 0.025 % gel Commonly known as: RETIN-A Apply 1 application topically at bedtime.   TRIPLE ANTIBIOTIC EX Apply 1 application topically as needed (cuts/scrapes/scratches).   Vitamin D3 125 MCG (5000 UT) Caps Take 5,000 Units by mouth daily.   Voltaren 1 % Gel Generic drug: diclofenac Sodium APPLY 4 GRAMS TOPICALLY TO AFFECTED AREA(S) FOUR TIMES DAILY What changed: See the new instructions.   Zinc Oxide 40 % Pste Apply thin layer topically to buttocks and scrotum BID as needed. What changed:   how much to take  how to take this  when to take this  reasons to take this  additional instructions        Clearance Coots, LCSW

## 2019-10-09 NOTE — Care Management CC44 (Signed)
Condition Code 44 Documentation Completed  Patient Details  Name: [name] MRN: [id number] Date of Birth: 1960-11-16   Condition Code 44 given:  Yes Patient signature on Condition Code 44 notice:  Yes Documentation of 2 MD's agreement:  Yes Code 44 added to claim:  Yes    Armanda Heritage, RN 10/09/2019, 1:51 PM

## 2019-10-09 NOTE — Progress Notes (Signed)
  Subjective:  Patient ID: [name], male    DOB: December 17, 1960,  MRN: [id number]  Patient seen bedside. Denies issues. Objective:   Vitals:   10/08/19 2036 10/09/19 0455  BP: (!) 119/59 119/60  Pulse: 85 83  Resp: 14 14  Temp: 97.7 F (36.5 C) 98.8 F (37.1 C)  SpO2: 100% 97%   General AA&O x3. Normal mood and affect.  Vascular Dorsalis pedis and posterior tibial pulses 2/4 bilat. Brisk capillary refill to all digits. Pedal hair present.  Neurologic Epicritic sensation grossly intact.  Dermatologic Dressing intact RLE. Wound VAC on and functioning.  Orthopedic: Contracted, non-ambulatory.   Assessment & Plan:  Patient was evaluated and treated and all questions answered.  S/p R 5th Ray resection for osteomyelitis with adjacent tissue transfer. -Dressing left intact. -Cultures reviewed NGTD.  -Attempted to call sister no answer, unable to leave VM. -Wound care instructions: leave dressing intact. Change dressing prior to d/c - xeroform to incision, betadine soaked 4x4, kerlix, ACE. -Will not need WVAC at d/c. -Plan for d/c today back to facility if possible. Otherwise tomorrow.  Park Liter, DPM  Accessible via secure chat for questions or concerns.

## 2019-10-09 NOTE — Care Management Obs Status (Signed)
MEDICARE OBSERVATION STATUS NOTIFICATION   Patient Details  Name: [name] MRN: [id number] Date of Birth: [birth year]   Medicare Observation Status Notification Given:  Yes    Armanda Heritage, RN 10/09/2019, 1:51 PM

## 2019-10-09 NOTE — Telephone Encounter (Signed)
Attempted to call sister - no answer VM full unable to leave messages.

## 2019-10-09 NOTE — Care Management Obs Status (Signed)
MEDICARE OBSERVATION STATUS NOTIFICATION   Patient Details  Name: NIMESH RIOLO MRN: [id number] Date of Birth: [birth year]   Medicare Observation Status Notification Given:       Armanda Heritage, RN 10/09/2019, 1:51 PM

## 2019-10-09 NOTE — Discharge Summary (Addendum)
. Physician Discharge Summary  [name]:[id number] DOB: 1961/01/23 DOA: 10/08/2019  PCP: Joana Reamer, DO  Admit date: 10/08/2019 Discharge date: 10/09/2019  Admitted From: Group home.  Disposition:  Discharged to Group home.   Recommendations for Outpatient Follow-up:  1. Follow up with PCP in 1-2 weeks 2. Follow up with Podiatry in 1 week. Call for appt.   Discharge Condition: Stable  CODE STATUS: FULL   Brief/Interim Summary: [name] is a 59 y.o. male with medical history significant of cerebral palsy and osteomyelitis. Patient was taken to OR for right 5th ray amputation today for osteomyelitis. Patient was reviewed with ID who recommended admission for IV abx. Patient is unable to give history. [facility] was called for admission.   5/15: DPM has examined the patient today. He is stable for discharge from his standpoint. Dressing instructions as below. He will need follow up with PCP and DPM. I have updated his sister, Lynda Rainwater, with his status. Will discharge him back to his group home today. Resume home regimen at discharge.  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 He does not require any further abx or wound vac.      - per DPM:          - Wound care instructions: leave dressing intact. Change dressing prior to d/c          - xeroform to incision, betadine soaked 4x4, kerlix, ACE.         - Will not need WVAC at d/c.     - I have updated sister with info/plan; will discharge to group home today.   Cerebral palsy     - continue  depakote, resperidone, PRN ativan  GERD     - PPI  Chronic spasm     - baclofen  Discharge Instructions   Allergies as of 10/09/2019      Reactions   Adhesive [tape] Rash      Medication List    TAKE these medications   acetaminophen 500 MG tablet Commonly known as: TYLENOL Take 1,000 mg by mouth every 6 (six) hours as needed for moderate pain.   ascorbic acid 250 MG tablet Commonly known as: VITAMIN C Take 1 tablet (250 mg total) by mouth daily.   baclofen 20 MG tablet Commonly known as: LIORESAL Take 1 tablet (20 mg total) by mouth 3 (three) times daily.   brompheniramine-pseudoephedrine 1-15 MG/5ML Elix Commonly known as: DIMETAPP Take 10 mLs by mouth as needed.   cetirizine 10 MG tablet Commonly known as: ZYRTEC Take 1 tablet (10 mg total) by mouth daily. What changed: when to take this   clotrimazole 1 % cream Commonly known as: LOTRIMIN Apply 1 application topically 2 (two) times daily.   divalproex 125 MG capsule Commonly known as: DEPAKOTE SPRINKLE Take 500 mg by mouth 2 (two) times daily.   Ear Drops Earwax Aid 6.5 % OTIC solution Generic drug: carbamide  peroxide PLACE 5 DROPS INTO THE LEFT EAR TWICE DAILY What changed: See the new instructions.   guaiFENesin-dextromethorphan 100-10 MG/5ML syrup Commonly known as: ROBITUSSIN DM Take 10 mLs by mouth every 4 (four) hours as needed for cough.   HYDROcodone-acetaminophen 5-325 MG tablet Commonly known as: NORCO/VICODIN Take 1 tablet by mouth every 6 (six) hours as needed for up to 5 days for moderate pain.   hydrocortisone cream 1 % Apply 1 application topically at bedtime as needed for itching.   hydrocortisone valerate cream 0.2 % Commonly known as: WESTCORT Apply 1 application topically 2 (two) times daily.   iron polysaccharides 150 MG capsule Commonly known as: NIFEREX Take 150 mg by mouth daily.   Jobst Anti-Em Knee High Med Misc by Does not apply route. Apply once in the morning  and remove at bedtime daily   ketoconazole 2 % shampoo Commonly known as: NIZORAL APPLY TOPICALLY TO AFFECTED AREA(S) TWICE WEEKLY AS DIRECTED What changed: See the new instructions.   LORazepam 2 MG tablet Commonly known as: ATIVAN Take 1 mg by mouth every 4 (four) hours as needed for anxiety.   Mylanta 200-200-20 MG/5ML suspension Generic drug: alum & mag hydroxide-simeth Take 30 mLs by mouth every 6 (six) hours as needed for indigestion or heartburn. Use as directed as needed for nausea/indigestion per standing order   omeprazole 40 MG capsule Commonly known as: PRILOSEC Take 1 capsule (40 mg total) by mouth 2 (two) times daily.   oyster calcium 500 MG Tabs tablet Take 1,500 mg of elemental calcium by mouth daily.   polyethylene glycol 17 g packet Commonly known as: MiraLax Take 17 g by mouth daily as needed for moderate constipation.   Prevalon Heel Protector Misc 1 Device by Does not apply route daily.   risperiDONE 2 MG tablet Commonly known as: RISPERDAL Take 2 mg by mouth at bedtime.   tamsulosin 0.4 MG Caps capsule Commonly known as: FLOMAX TAKE 1 CAPSULE BY MOUTH EVERY DAY *DO NOT CRUSH* What changed: See the new instructions.   tiZANidine 2 MG tablet Commonly known as: ZANAFLEX Take 2 mg by mouth 3 (three) times daily.   tolterodine 2 MG 24 hr capsule Commonly known as: Detrol LA Take 1 capsule (2 mg total) by mouth daily.   tretinoin 0.025 % gel Commonly known as: RETIN-A Apply 1 application topically at bedtime.   TRIPLE ANTIBIOTIC EX Apply 1 application topically as needed (cuts/scrapes/scratches).   Vitamin D3 125 MCG (5000 UT) Caps Take 5,000 Units by mouth daily.   Voltaren 1 % Gel Generic drug: diclofenac Sodium APPLY 4 GRAMS TOPICALLY TO AFFECTED AREA(S) FOUR TIMES DAILY What changed: See the new instructions.   Zinc Oxide 40 % Pste Apply thin layer topically to buttocks and scrotum BID as needed. What changed:   how much to  take  how to take this  when to take this  reasons to take this  additional instructions      Follow-up Information    Mullis, Kiersten P, DO Follow up in 1 week(s).   Specialty: Family Medicine Contact information: [address] [place] [address] [phone]        Park Liter, DPM. Call in 1 week(s).   Specialty: Podiatry Contact information: [address] [place] [address] [phone]          Allergies  Allergen Reactions  . Adhesive [Tape] Rash    Consultations:  DPM   Procedures/Studies: MR FOOT RIGHT W WO CONTRAST  Result Date: 09/20/2019 CLINICAL DATA:  Lateral foot ulcer. Evaluate for osteomyelitis. EXAM: MRI OF THE RIGHT FOREFOOT WITHOUT AND WITH CONTRAST TECHNIQUE: Multiplanar, multisequence MR imaging of the right foot was performed before and after the administration of intravenous contrast. CONTRAST:  8mL GADAVIST GADOBUTROL 1 MMOL/ML IV SOLN COMPARISON:  Right foot x-rays dated August 24, 2019. FINDINGS: Bones/Joint/Cartilage Abnormal marrow edema and enhancement with early decreased T1 marrow signal involving the proximal half of the fifth metatarsal, consistent with osteomyelitis. Marrow edema in the fifth proximal phalanx head and neck with preserved T1 marrow signal, likely reactive. Prior amputation of the great toe to the mid first metatarsal. No fracture or dislocation. No suspicious bone lesion. No joint effusion. Pes planovalgus. Ligaments Collateral ligaments are intact. Lisfranc ligament is intact. Muscles and Tendons Flexor, peroneal and extensor compartment tendons are intact. Increased T2 signal and atrophy within the intrinsic muscles of the forefoot, nonspecific, but likely related to denervation. Soft tissue Soft tissue ulceration along the lateral midfoot. Diffuse soft tissue swelling. No fluid collection or hematoma. No soft tissue mass. IMPRESSION: 1. Soft tissue ulceration along the lateral midfoot with  underlying osteomyelitis of the proximal half of the fifth metatarsal. No abscess. Electronically Signed   By: Obie DredgeWilliam T Derry M.D.   On: 09/20/2019 16:11     Subjective: No acute events ON.   Discharge Exam: Vitals:   10/08/19 2036 10/09/19 0455  BP: (!) 119/59 119/60  Pulse: 85 83  Resp: 14 14  Temp: 97.7 F (36.5 C) 98.8 F (37.1 C)  SpO2: 100% 97%   Vitals:   10/08/19 1741 10/08/19 1805 10/08/19 2036 10/09/19 0455  BP:  (!) 146/91 (!) 119/59 119/60  Pulse: 66 62 85 83  Resp: 13 14 14 14   Temp:  97.7 F (36.5 C) 97.7 F (36.5 C) 98.8 F (37.1 C)  TempSrc:  Oral    SpO2: 99% 100% 100% 97%  Weight:      Height:       General: 59 y.o. male resting in bed in NAD Cardiovascular: RRR, +S1, S2, no m/g/r, equal pulses throughout Respiratory: CTABL, no w/r/r, normal WOB GI: BS+, NDNT, no masses noted, no organomegaly noted MSK: No e/c/c, RLE bandaging is CDI Neuro: Alert to name and place; some contracture Psyc: calm/cooperative  The results of significant diagnostics from this hospitalization (including imaging, microbiology, ancillary and laboratory) are listed below for reference.     Microbiology: Recent Results (from the past 240 hour(s))  Surgical pcr screen     Status: None   Collection Time: 10/08/19 11:32 AM   Specimen: Nasal Mucosa; Nasal Swab  Result Value Ref Range Status   MRSA, PCR NEGATIVE NEGATIVE Final   Staphylococcus aureus NEGATIVE NEGATIVE Final    Comment: (NOTE) The Xpert SA Assay (FDA approved for NASAL specimens in patients 60 years of age and older), is one component of a comprehensive surveillance program. It is not intended to diagnose infection nor to guide or monitor treatment. Performed at [facility], [name] [place], [place] [id number]   SARS Coronavirus 2 by RT PCR (hospital order, performed in [facility] hospital lab) Nasopharyngeal Nasopharyngeal Swab     Status: None   Collection Time: 10/08/19  11:32 AM   Specimen: Nasopharyngeal Swab  Result Value Ref Range Status   SARS Coronavirus 2 NEGATIVE NEGATIVE Final    Comment: (NOTE) SARS-CoV-2 target nucleic acids are NOT DETECTED. The SARS-CoV-2 RNA is generally detectable in upper and lower respiratory specimens  during the acute phase of infection. The lowest concentration of SARS-CoV-2 viral copies this assay can detect is 250 copies / mL. A negative result does not preclude SARS-CoV-2 infection and should not be used as the sole basis for treatment or other patient management decisions.  A negative result may occur with improper specimen collection / handling, submission of specimen other than nasopharyngeal swab, presence of viral mutation(s) within the areas targeted by this assay, and inadequate number of viral copies (<250 copies / mL). A negative result must be combined with clinical observations, patient history, and epidemiological information. Fact Sheet for Patients:   StrictlyIdeas.no Fact Sheet for Healthcare Providers: BankingDealers.co.za This test is not yet approved or cleared  by the [place] FDA and has been authorized for detection and/or diagnosis of SARS-CoV-2 by FDA under an Emergency Use Authorization (EUA).  This EUA will remain in effect (meaning this test can be used) for the duration of the COVID-19 declaration under Section 564(b)(1) of the Act, 21 U.S.C. section 360bbb-3(b)(1), unless the authorization is terminated or revoked sooner. Performed at [facility], [place] [address], [place], [place] [address]   Aerobic/Anaerobic Culture (surgical/deep wound)     Status: None (Preliminary result)   Collection Time: 10/08/19  6:15 PM   Specimen: PATH Soft tissue  Result Value Ref Range Status   Specimen Description   Final    TISSUE Performed at [place] [address], [place], [place] [address]    Special  Requests RIGHT 5 METARSAL BONE SAMPLE 2  Final   Gram Stain   Final    NO WBC SEEN NO ORGANISMS SEEN Performed at [facility], [place] [address], [place], [place] [address]    Culture PENDING  Incomplete   Report Status PENDING  Incomplete  Aerobic/Anaerobic Culture (surgical/deep wound)     Status: None (Preliminary result)   Collection Time: 10/08/19  6:15 PM   Specimen: Tissue  Result Value Ref Range Status   Specimen Description TISSUE  Final   Special Requests RIGHT FOOT POST LAVAGE SAMPLE 3  Final   Gram Stain   Final    NO WBC SEEN NO ORGANISMS SEEN Performed at [facility], [place] [address], [place], [place] [address]    Culture PENDING  Incomplete   Report Status PENDING  Incomplete     Labs: BNP (last 3 results) No results for input(s): BNP in the last 8760 hours. Basic Metabolic Panel: Recent Labs  Lab 10/08/19 1200 10/09/19 0347  NA 140 138  K 4.0 3.8  CL 101 106  CO2 28 25  GLUCOSE 92 89  BUN 12 12  CREATININE 0.44* 0.42*  CALCIUM 9.3 8.7*  MG  --  2.0   Liver Function Tests: Recent Labs  Lab 10/09/19 0347  AST 24  ALT 24  ALKPHOS 139*  BILITOT 0.4  PROT 6.0*  ALBUMIN 2.7*   No results for input(s): LIPASE, AMYLASE in the last 168 hours. No results for input(s): AMMONIA in the last 168 hours. CBC: Recent Labs  Lab 10/08/19 1200 10/09/19 0347  WBC 8.1 10.3  HGB 13.1 10.9*  HCT 42.3 34.5*  MCV 80.1 78.6*  PLT 517* 465*   Cardiac Enzymes: No results for input(s): CKTOTAL, CKMB, CKMBINDEX, TROPONINI in the last 168 hours. BNP: Invalid input(s): POCBNP CBG: Recent Labs  Lab 10/08/19 1651  GLUCAP 90   D-Dimer No results for input(s): DDIMER in the last 72 hours. Hgb A1c No  results for input(s): HGBA1C in the last 72 hours. Lipid Profile No results for input(s): CHOL, HDL, LDLCALC, TRIG, CHOLHDL, LDLDIRECT in the last 72 hours. Thyroid function studies No results for input(s): TSH, T4TOTAL, T3FREE, THYROIDAB in the last  72 hours.  Invalid input(s): FREET3 Anemia work up No results for input(s): VITAMINB12, FOLATE, FERRITIN, TIBC, IRON, RETICCTPCT in the last 72 hours. Urinalysis    Component Value Date/Time   COLORURINE YELLOW 04/08/2019 1630   APPEARANCEUR HAZY (A) 04/08/2019 1630   LABSPEC 1.026 04/08/2019 1630   PHURINE 5.0 04/08/2019 1630   GLUCOSEU NEGATIVE 04/08/2019 1630   HGBUR NEGATIVE 04/08/2019 1630   HGBUR negative 06/14/2010 1119   BILIRUBINUR NEGATIVE 04/08/2019 1630   BILIRUBINUR SMALL 01/04/2016 1634   KETONESUR NEGATIVE 04/08/2019 1630   PROTEINUR 30 (A) 04/08/2019 1630   UROBILINOGEN 2.0 (H) 02/05/2018 1825   NITRITE NEGATIVE 04/08/2019 1630   LEUKOCYTESUR NEGATIVE 04/08/2019 1630   Sepsis Labs Invalid input(s): PROCALCITONIN,  WBC,  LACTICIDVEN Microbiology Recent Results (from the past 240 hour(s))  Surgical pcr screen     Status: None   Collection Time: 10/08/19 11:32 AM   Specimen: Nasal Mucosa; Nasal Swab  Result Value Ref Range Status   MRSA, PCR NEGATIVE NEGATIVE Final   Staphylococcus aureus NEGATIVE NEGATIVE Final    Comment: (NOTE) The Xpert SA Assay (FDA approved for NASAL specimens in patients 53 years of age and older), is one component of a comprehensive surveillance program. It is not intended to diagnose infection nor to guide or monitor treatment. Performed at [facility], [address], [place], [place] [address]   SARS Coronavirus 2 by RT PCR (hospital order, performed in [facility] hospital lab) Nasopharyngeal Nasopharyngeal Swab     Status: None   Collection Time: 10/08/19 11:32 AM   Specimen: Nasopharyngeal Swab  Result Value Ref Range Status   SARS Coronavirus 2 NEGATIVE NEGATIVE Final    Comment: (NOTE) SARS-CoV-2 target nucleic acids are NOT DETECTED. The SARS-CoV-2 RNA is generally detectable in upper and lower respiratory specimens during the acute phase of infection. The lowest concentration of SARS-CoV-2 viral  copies this assay can detect is 250 copies / mL. A negative result does not preclude SARS-CoV-2 infection and should not be used as the sole basis for treatment or other patient management decisions.  A negative result may occur with improper specimen collection / handling, submission of specimen other than nasopharyngeal swab, presence of viral mutation(s) within the areas targeted by this assay, and inadequate number of viral copies (<250 copies / mL). A negative result must be combined with clinical observations, patient history, and epidemiological information. Fact Sheet for Patients:   BoilerBrush.com.cy Fact Sheet for Healthcare Providers: https://pope.com/ This test is not yet approved or cleared  by the [place] FDA and has been authorized for detection and/or diagnosis of SARS-CoV-2 by FDA under an Emergency Use Authorization (EUA).  This EUA will remain in effect (meaning this test can be used) for the duration of the COVID-19 declaration under Section 564(b)(1) of the Act, 21 U.S.C. section 360bbb-3(b)(1), unless the authorization is terminated or revoked sooner. Performed at [facility], [address], [place], [place] [address]   Aerobic/Anaerobic Culture (surgical/deep wound)     Status: None (Preliminary result)   Collection Time: 10/08/19  6:15 PM   Specimen: PATH Soft tissue  Result Value Ref Range Status   Specimen Description   Final    TISSUE Performed at [facility], [address], [place], [place] [address]    Special Requests RIGHT 5 METARSAL BONE SAMPLE 2  Final   Gram Stain   Final    NO WBC SEEN NO ORGANISMS SEEN Performed at [facility], [address], [place], [place] [address]    Culture PENDING  Incomplete   Report Status PENDING  Incomplete  Aerobic/Anaerobic Culture (surgical/deep wound)     Status: None (Preliminary result)   Collection Time:  10/08/19  6:15 PM   Specimen: Tissue  Result Value Ref Range Status   Specimen Description TISSUE  Final   Special Requests RIGHT FOOT POST LAVAGE SAMPLE 3  Final   Gram Stain   Final    NO WBC SEEN NO ORGANISMS SEEN Performed at [facility], [address] [place], [place] [address]    Culture PENDING  Incomplete   Report Status PENDING  Incomplete     Time coordinating discharge: Over 35 minutes  SIGNED:   Teddy Spike, DO  [facility] 10/09/2019, 12:29 PM   If 7PM-7AM, please contact night-coverage [email or web address]

## 2019-10-11 LAB — SURGICAL PATHOLOGY

## 2019-10-12 ENCOUNTER — Encounter (HOSPITAL_BASED_OUTPATIENT_CLINIC_OR_DEPARTMENT_OTHER): Payer: Medicare Other | Admitting: Internal Medicine

## 2019-10-13 ENCOUNTER — Ambulatory Visit: Payer: Medicare Other | Admitting: Infectious Disease

## 2019-10-13 ENCOUNTER — Telehealth: Payer: Self-pay | Admitting: *Deleted

## 2019-10-13 NOTE — Telephone Encounter (Signed)
Group home cancelled patient's appointment today due to upcoming surgery.  Dr Daiva Eves aware per MRI notes:   Daiva Eves, Lisette Grinder, MD  Andree Coss, RN  Thanks Marcelino Duster, I will see him at [facility] or Aram Beecham at [facility]       Previous Messages   ----- Message -----  From: Andree Coss, RN  Sent: 10/06/2019  2:24 PM EDT  To: Randall Hiss, MD   Patient scheduled for surgery on this 5/14.   Andree Coss, RN  ----- Message -----  From: Daiva Eves, Lisette Grinder, MD  Sent: 09/21/2019  8:57 AM EDT  To: Maxwell Caul, MD, Park Liter, DPM, *   Patient has osteomyelitis on MRI, Casimiro Needle do you want to handle or do you wnt me to get ortho involved?

## 2019-10-14 LAB — AEROBIC/ANAEROBIC CULTURE W GRAM STAIN (SURGICAL/DEEP WOUND)
Gram Stain: NONE SEEN
Gram Stain: NONE SEEN

## 2019-10-15 ENCOUNTER — Ambulatory Visit (INDEPENDENT_AMBULATORY_CARE_PROVIDER_SITE_OTHER): Payer: Medicare Other | Admitting: Podiatry

## 2019-10-15 ENCOUNTER — Other Ambulatory Visit: Payer: Self-pay

## 2019-10-15 VITALS — Temp 97.5°F

## 2019-10-15 DIAGNOSIS — S99921A Unspecified injury of right foot, initial encounter: Secondary | ICD-10-CM | POA: Diagnosis not present

## 2019-10-15 DIAGNOSIS — L97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 layer exposed: Secondary | ICD-10-CM

## 2019-10-15 DIAGNOSIS — Z9889 Other specified postprocedural states: Secondary | ICD-10-CM

## 2019-10-17 NOTE — Progress Notes (Signed)
Subjective:  Patient ID: [name], male    DOB: 1960-08-01,  MRN: [id number]  Chief Complaint  Patient presents with  . Routine Post Op    POV#1 DOS 5.14.2021 RAY AMPUTATION 5TH RT. Pt states no concerns. Denies fever/chills/nausea/vomiting.    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 male presents with the above complaint. History confirmed with patient.   Objective:  Physical Exam: no tenderness at the surgical site, local edema noted and calf supple, nontender.  Third toe injury with onycholysis Incision: healing well, no significant drainage, no dehiscence, no significant erythema    Results for orders placed or performed during the hospital encounter of 10/08/19  Surgical pcr screen     Status: None   Collection Time: 10/08/19 11:32 AM   Specimen: Nasal Mucosa; Nasal Swab  Result Value Ref Range Status   MRSA, PCR NEGATIVE NEGATIVE Final   Staphylococcus aureus NEGATIVE NEGATIVE Final    Comment: (NOTE) The Xpert SA Assay (FDA approved for NASAL specimens in patients 49 years of age and older), is one component of a comprehensive surveillance program. It is not intended to diagnose infection nor to guide or monitor treatment. Performed at [facility], [place] [address], [place], [place] [address]   SARS Coronavirus 2 by RT PCR (hospital order, performed in [facility] hospital lab) Nasopharyngeal Nasopharyngeal Swab     Status: None   Collection Time: 10/08/19 11:32 AM   Specimen: Nasopharyngeal Swab  Result Value Ref Range Status   SARS Coronavirus 2 NEGATIVE NEGATIVE Final    Comment: (NOTE) SARS-CoV-2 target nucleic acids are NOT DETECTED. The SARS-CoV-2 RNA is generally detectable in upper and lower respiratory specimens during the acute phase of infection. The lowest concentration of SARS-CoV-2 viral copies this assay can detect is 250 copies / mL. A negative result does not preclude SARS-CoV-2  infection and should not be used as the sole basis for treatment or other patient management decisions.  A negative result may occur with improper specimen collection / handling, submission of specimen other than nasopharyngeal swab, presence of viral mutation(s) within the areas targeted by this assay, and inadequate number of viral copies (<250 copies / mL). A negative result must be combined with clinical observations, patient history, and epidemiological information. Fact Sheet for Patients:   StrictlyIdeas.no Fact Sheet for Healthcare Providers: BankingDealers.co.za This test is not yet approved or cleared  by the [place] FDA and has been authorized for detection and/or diagnosis of SARS-CoV-2 by FDA under an Emergency Use Authorization (EUA).  This EUA will remain in effect (meaning this test can be used) for the duration of the COVID-19 declaration under Section 564(b)(1) of the Act, 21 U.S.C. section 360bbb-3(b)(1), unless the authorization is terminated or revoked sooner. Performed at [facility], [place] [address], [place], [place] [address]   Aerobic/Anaerobic Culture (surgical/deep wound)     Status: None   Collection Time: 10/08/19  6:15 PM   Specimen: PATH Soft tissue  Result Value Ref Range Status   Specimen Description   Final    TISSUE Performed at [facility] [address], [place], [place] [address]    Special Requests RIGHT 5 METARSAL BONE SAMPLE 2  Final   Gram Stain NO WBC SEEN NO ORGANISMS SEEN   Final   Culture   Final    RARE PSEUDOMONAS AERUGINOSA CRITICAL RESULT CALLED TO, READ BACK BY AND VERIFIED WITH: DR March Rummage  [id number] FCP NO ANAEROBES ISOLATED Performed at [facility], [address] [place], [place] [address]    Report Status 10/14/2019 FINAL  Final   Organism ID, Bacteria PSEUDOMONAS AERUGINOSA  Final      Susceptibility   Pseudomonas  aeruginosa - MIC*    CEFTAZIDIME 4 SENSITIVE Sensitive     CIPROFLOXACIN <=0.25 SENSITIVE Sensitive     GENTAMICIN 2 SENSITIVE Sensitive     IMIPENEM 2 SENSITIVE Sensitive     PIP/TAZO 8 SENSITIVE Sensitive     CEFEPIME 8 SENSITIVE Sensitive     * RARE PSEUDOMONAS AERUGINOSA  Aerobic/Anaerobic Culture (surgical/deep wound)     Status: None   Collection Time: 10/08/19  6:15 PM   Specimen: Tissue  Result Value Ref Range Status   Specimen Description TISSUE  Final   Special Requests RIGHT FOOT POST LAVAGE SAMPLE 3  Final   Gram Stain NO WBC SEEN NO ORGANISMS SEEN   Final   Culture   Final    RARE PSEUDOMONAS AERUGINOSA CRITICAL RESULT CALLED TO, READ BACK BY AND VERIFIED WITH: DR Samuella Cota [id number] FCP NO ANAEROBES ISOLATED Performed at [facility], [address] [place], [place] [address]    Report Status 10/14/2019 FINAL  Final   Organism ID, Bacteria PSEUDOMONAS AERUGINOSA  Final      Susceptibility   Pseudomonas aeruginosa - MIC*    CEFTAZIDIME 4 SENSITIVE Sensitive     CIPROFLOXACIN <=0.25 SENSITIVE Sensitive     GENTAMICIN 2 SENSITIVE Sensitive     IMIPENEM 2 SENSITIVE Sensitive     PIP/TAZO 8 SENSITIVE Sensitive     CEFEPIME 2 SENSITIVE Sensitive     * RARE PSEUDOMONAS AERUGINOSA   Assessment:   1. Ulcerated, foot, right, with fat layer exposed (HCC)   2. Post-operative state   3. Injury of toenail of right foot, initial encounter    Plan:  Patient was evaluated and treated and all questions answered.  Post-operative State -Dressing applied consisting of sterile gauze, kerlix and ACE bandage  -Discussed importance of follow-up with infectious disease reevaluated more than 3 indicated.  He has had surgical cure osteomyelitis however the soft tissue culture did show complex soft tissue infection  Third toe injury -Nail manually removed without anesthesia -Dressed with Silvadene and Band-Aid.  No follow-ups on file.

## 2019-10-18 NOTE — Progress Notes (Signed)
[name] in Treatment:  114 Education Assessment Education Provided To: Patient Education Topics Provided Wound/Skin Impairment: Handouts: Skin Care Do's and Dont's Methods: Explain/Verbal Responses: Reinforcements needed Electronic Signature(s) Signed: 09/23/2019 5:33:37 PM By: Deon Pilling Entered By: Deon Pilling on 09/23/2019 08:08:32 -------------------------------------------------------------------------------- Wound Assessment Details Patient Name: Date of Service: [name], [name] 09/23/2019 7:30 A M Medical Record Number: [id number] Patient Account Number: [id number] Date of Birth/Sex: Treating RN: February 04, 1961 (59 y.o. [name] Primary Care Yuvaan Olander: [name] Other Clinician: Referring Francys Bolin: Treating Magaly Pollina/Extender: Kendell Bane in Treatment: 114 Wound Status Wound Number: 37 Primary Pressure Ulcer Etiology: Wound Location: Right, Lateral Foot Wound Open Wounding Event: Pressure Injury Status: Date Acquired: 05/24/2019 Comorbid Deep Vein Thrombosis, Colitis, Osteomyelitis, Neuropathy, Weeks Of Treatment: 13 History: Quadriplegia, Confinement Anxiety Clustered Wound: No Photos Photo Uploaded By: Mikeal Hawthorne on 09/24/2019 14:45:26 Wound Measurements Length: (cm) 4.2 Width: (cm) 2.7 Depth: (cm) 0.2 Area: (cm) 8.906 Volume: (cm) 1.781 % Reduction in Area: -40.9% % Reduction in Volume: -40.9% Epithelialization: Small (1-33%) Tunneling: No Undermining: No Wound Description Classification: Category/Stage III Wound Margin: Flat and Intact Exudate Amount: Medium Exudate Type: Serous Exudate Color: amber Foul Odor After Cleansing: No Slough/Fibrino Yes Wound Bed Granulation Amount: Medium (34-66%) Exposed Structure Granulation Quality: Pink Fascia Exposed: No Necrotic Amount: Medium (34-66%) Fat Layer (Subcutaneous Tissue) Exposed: Yes Necrotic Quality: Adherent Slough Tendon Exposed: No Muscle Exposed: No Joint  Exposed: No Bone Exposed: No Electronic Signature(s) Signed: 09/23/2019 5:19:50 PM By: Kela Millin Entered By: Kela Millin on 09/23/2019 07:58:21 -------------------------------------------------------------------------------- Wound Assessment Details Patient Name: Date of Service: [name], [name] 09/23/2019 7:30 A M Medical Record Number: [id number] Patient Account Number: [id number] Date of Birth/Sex: Treating RN: [birth year] (59 y.o. [name] Primary Care Virgia Kelner: [name] Other Clinician: Referring Allenmichael Mcpartlin: Treating Latreshia Beauchaine/Extender: Kendell Bane in Treatment: 114 Wound Status Wound Number: 38 Primary Pressure Ulcer Etiology: Wound Location: Right T Fifth oe Wound Open Wounding Event: Shear/Friction Status: Date Acquired: 07/21/2019 Comorbid Deep Vein Thrombosis, Colitis, Osteomyelitis, Neuropathy, Weeks Of Treatment: 9 History: Quadriplegia, Confinement Anxiety Clustered Wound: No Photos Photo Uploaded By: Mikeal Hawthorne on 09/24/2019 14:45:27 Wound Measurements Length: (cm) 0.5 Width: (cm) 0.8 Depth: (cm) 0.1 Area: (cm) 0.314 Volume: (cm) 0.031 % Reduction in Area: 28.6% % Reduction in Volume: 64.8% Epithelialization: Large (67-100%) Tunneling: No Undermining: No Wound Description Classification: Category/Stage II Wound Margin: Distinct, outline attached Exudate Amount: Small Exudate Type: Serous Exudate Color: amber Foul Odor After Cleansing: No Slough/Fibrino No Wound Bed Granulation Amount: None Present (0%) Exposed Structure Necrotic Amount: Large (67-100%) Fascia Exposed: No Necrotic Quality: Eschar Fat Layer (Subcutaneous Tissue) Exposed: Yes Tendon Exposed: No Muscle Exposed: No Joint Exposed: No Bone Exposed: No Electronic Signature(s) Signed: 09/23/2019 5:19:50 PM By: Kela Millin Entered By: Kela Millin on 09/23/2019  07:58:00 -------------------------------------------------------------------------------- Vitals Details Patient Name: Date of Service: [name] Birchwood. 09/23/2019 7:30 A M Medical Record Number: [id number] Patient Account Number: [id number] Date of Birth/Sex: Treating RN: 10-Dec-1960 (59 y.o. [name] Primary Care Denaya Horn: [name] Other Clinician: Referring Austan Nicholl: Treating Kasiyah Platter/Extender: Kendell Bane in Treatment: 114 Vital Signs Time Taken: 07:52 Temperature (F): 98 Height (in): 73 Pulse (bpm): 85 Weight (lbs): 178 Respiratory Rate (breaths/min): 19 Body Mass Index (BMI): 23.5 Blood Pressure (mmHg): 149/84 Reference Range: 80 - 120 mg / dl Electronic Signature(s) Signed: 09/23/2019 5:19:50 PM By: Kela Millin Entered By: Kela Millin on 09/23/2019 07:52:58  [name] in Treatment:  114 Education Assessment Education Provided To: Patient Education Topics Provided Wound/Skin Impairment: Handouts: Skin Care Do's and Dont's Methods: Explain/Verbal Responses: Reinforcements needed Electronic Signature(s) Signed: 09/23/2019 5:33:37 PM By: Deon Pilling Entered By: Deon Pilling on 09/23/2019 08:08:32 -------------------------------------------------------------------------------- Wound Assessment Details Patient Name: Date of Service: [name], [name] 09/23/2019 7:30 A M Medical Record Number: [id number] Patient Account Number: [id number] Date of Birth/Sex: Treating RN: February 04, 1961 (59 y.o. [name] Primary Care Yuvaan Olander: [name] Other Clinician: Referring Francys Bolin: Treating Magaly Pollina/Extender: Kendell Bane in Treatment: 114 Wound Status Wound Number: 37 Primary Pressure Ulcer Etiology: Wound Location: Right, Lateral Foot Wound Open Wounding Event: Pressure Injury Status: Date Acquired: 05/24/2019 Comorbid Deep Vein Thrombosis, Colitis, Osteomyelitis, Neuropathy, Weeks Of Treatment: 13 History: Quadriplegia, Confinement Anxiety Clustered Wound: No Photos Photo Uploaded By: Mikeal Hawthorne on 09/24/2019 14:45:26 Wound Measurements Length: (cm) 4.2 Width: (cm) 2.7 Depth: (cm) 0.2 Area: (cm) 8.906 Volume: (cm) 1.781 % Reduction in Area: -40.9% % Reduction in Volume: -40.9% Epithelialization: Small (1-33%) Tunneling: No Undermining: No Wound Description Classification: Category/Stage III Wound Margin: Flat and Intact Exudate Amount: Medium Exudate Type: Serous Exudate Color: amber Foul Odor After Cleansing: No Slough/Fibrino Yes Wound Bed Granulation Amount: Medium (34-66%) Exposed Structure Granulation Quality: Pink Fascia Exposed: No Necrotic Amount: Medium (34-66%) Fat Layer (Subcutaneous Tissue) Exposed: Yes Necrotic Quality: Adherent Slough Tendon Exposed: No Muscle Exposed: No Joint  Exposed: No Bone Exposed: No Electronic Signature(s) Signed: 09/23/2019 5:19:50 PM By: Kela Millin Entered By: Kela Millin on 09/23/2019 07:58:21 -------------------------------------------------------------------------------- Wound Assessment Details Patient Name: Date of Service: [name], [name] 09/23/2019 7:30 A M Medical Record Number: [id number] Patient Account Number: [id number] Date of Birth/Sex: Treating RN: [birth year] (59 y.o. [name] Primary Care Virgia Kelner: [name] Other Clinician: Referring Allenmichael Mcpartlin: Treating Latreshia Beauchaine/Extender: Kendell Bane in Treatment: 114 Wound Status Wound Number: 38 Primary Pressure Ulcer Etiology: Wound Location: Right T Fifth oe Wound Open Wounding Event: Shear/Friction Status: Date Acquired: 07/21/2019 Comorbid Deep Vein Thrombosis, Colitis, Osteomyelitis, Neuropathy, Weeks Of Treatment: 9 History: Quadriplegia, Confinement Anxiety Clustered Wound: No Photos Photo Uploaded By: Mikeal Hawthorne on 09/24/2019 14:45:27 Wound Measurements Length: (cm) 0.5 Width: (cm) 0.8 Depth: (cm) 0.1 Area: (cm) 0.314 Volume: (cm) 0.031 % Reduction in Area: 28.6% % Reduction in Volume: 64.8% Epithelialization: Large (67-100%) Tunneling: No Undermining: No Wound Description Classification: Category/Stage II Wound Margin: Distinct, outline attached Exudate Amount: Small Exudate Type: Serous Exudate Color: amber Foul Odor After Cleansing: No Slough/Fibrino No Wound Bed Granulation Amount: None Present (0%) Exposed Structure Necrotic Amount: Large (67-100%) Fascia Exposed: No Necrotic Quality: Eschar Fat Layer (Subcutaneous Tissue) Exposed: Yes Tendon Exposed: No Muscle Exposed: No Joint Exposed: No Bone Exposed: No Electronic Signature(s) Signed: 09/23/2019 5:19:50 PM By: Kela Millin Entered By: Kela Millin on 09/23/2019  07:58:00 -------------------------------------------------------------------------------- Vitals Details Patient Name: Date of Service: [name] Birchwood. 09/23/2019 7:30 A M Medical Record Number: [id number] Patient Account Number: [id number] Date of Birth/Sex: Treating RN: 10-Dec-1960 (59 y.o. [name] Primary Care Denaya Horn: [name] Other Clinician: Referring Austan Nicholl: Treating Kasiyah Platter/Extender: Kendell Bane in Treatment: 114 Vital Signs Time Taken: 07:52 Temperature (F): 98 Height (in): 73 Pulse (bpm): 85 Weight (lbs): 178 Respiratory Rate (breaths/min): 19 Body Mass Index (BMI): 23.5 Blood Pressure (mmHg): 149/84 Reference Range: 80 - 120 mg / dl Electronic Signature(s) Signed: 09/23/2019 5:19:50 PM By: Kela Millin Entered By: Kela Millin on 09/23/2019 07:52:58  Cleansing - multiple wounds X- 1 5 Wound Imaging (photographs - any number of wounds) [] - 0 Wound Tracing (instead of photographs) [] - 0 Simple Wound Measurement - one wound X- 2 5 Complex Wound Measurement - multiple wounds INTERVENTIONS - Wound Dressings X - Small Wound Dressing one or multiple wounds 1 10 [] - 0 Medium Wound Dressing one or multiple wounds X- 1 20 Large Wound Dressing one or multiple wounds [] - 0 Application of Medications - topical [] - 0 Application of Medications - injection INTERVENTIONS - Miscellaneous [] - 0 External ear exam [] - 0 Specimen Collection (cultures, biopsies, blood, body fluids, etc.) [] - 0 Specimen(s) / Culture(s) sent or taken to Lab for analysis [] - 0 Patient Transfer (multiple staff / Civil Service fast streamer / Similar devices) [] - 0 Simple Staple / Suture removal (25 or less) [] - 0 Complex Staple / Suture removal (26 or more) [] - 0 Hypo / Hyperglycemic Management (close monitor of Blood Glucose) [] - 0 Ankle / Brachial Index (ABI) - do not check if billed separately [] - 0 Vital Signs Has the patient been seen at the hospital within the last three years: Yes Total Score: 160 Level Of Care: New/Established - Level 5 Electronic Signature(s) Signed: 09/23/2019 5:33:37 PM By: Deon Pilling Entered By: Deon Pilling on 09/23/2019 08:17:34 -------------------------------------------------------------------------------- Encounter Discharge Information Details Patient Name: Date of Service: [name] Birchwood. 09/23/2019 7:30 A M Medical Record Number: [id number] Patient Account Number: [id number] Date of Birth/Sex: Treating RN: 05/17/1961 (59 y.o. [name] Primary Care Shatonya Passon: [name] Other Clinician: Referring  Kaysia Willard: Treating Garlan Drewes/Extender: Kendell Bane in Treatment: 114 Encounter Discharge Information Items Discharge Condition: Stable Ambulatory Status: Wheelchair Discharge Destination: Home Transportation: Other Accompanied By: caregiver Schedule Follow-up Appointment: Yes Clinical Summary of Care: Patient Declined Notes facility transportation Electronic Signature(s) Signed: 09/23/2019 5:04:41 PM By: Baruch Gouty RN, BSN Entered By: Baruch Gouty on 09/23/2019 08:38:14 -------------------------------------------------------------------------------- Lower Extremity Assessment Details Patient Name: Date of Service: [name], [name] 09/23/2019 7:30 A M Medical Record Number: [id number] Patient Account Number: [id number] Date of Birth/Sex: Treating RN: 09-27-1960 (58 y.o. [name] Primary Care Dylin Breeden: [name] Other Clinician: Referring Demone Lyles: Treating Herron Fero/Extender: Kendell Bane in Treatment: 114 Edema Assessment Assessed: Shirlyn Goltz: No] Patrice Paradise: No] Edema: [Left: Ye] [Right: s] Calf Left: Right: Point of Measurement: cm From Medial Instep cm 26 cm Ankle Left: Right: Point of Measurement: cm From Medial Instep cm 19 cm Vascular Assessment Pulses: Dorsalis Pedis Palpable: [Right:Yes] Electronic Signature(s) Signed: 09/23/2019 5:19:50 PM By: Kela Millin Entered By: Kela Millin on 09/23/2019 07:54:39 -------------------------------------------------------------------------------- Multi Wound Chart Details Patient Name: Date of Service: [name] Birchwood. 09/23/2019 7:30 A M Medical Record Number: [id number] Patient Account Number: [id number] Date of Birth/Sex: Treating RN: 1961/03/22 (59 y.o. [name] Primary Care Carlyn Lemke: [name] Other Clinician: Referring Luvern Mischke: Treating Chistine Dematteo/Extender: Kendell Bane in Treatment: 114 Vital  Signs Height(in): 73 Pulse(bpm): 85 Weight(lbs): 178 Blood Pressure(mmHg): 149/84 Body Mass Index(BMI): 23 Temperature(F): 98 Respiratory Rate(breaths/min): 19 Photos: [37:No Photos Right, Lateral Foot] [38:No Photos Right T Fifth oe] [N/A:N/A N/A] Wound Location: [37:Pressure Injury] [38:Shear/Friction] [N/A:N/A] Wounding Event: [37:Pressure Ulcer] [38:Pressure Ulcer] [N/A:N/A] Primary Etiology: [37:Deep Vein Thrombosis, Colitis,] [38:Deep Vein Thrombosis, Colitis,] [N/A:N/A] Comorbid History: [37:Osteomyelitis, Neuropathy, Quadriplegia, Confinement Anxiety 05/24/2019] [38:Osteomyelitis, Neuropathy, Quadriplegia, Confinement Anxiety 07/21/2019] [N/A:N/A] Date Acquired: [16:94] [38:9] [N/A:N/A] Weeks of Treatment: [37:Open] [38:Open] [N/A:N/A] Wound Status: [37:4.2x2.7x0.2] [38:0.5x0.8x0.1] [N/A:N/A] Measurements L  Cleansing - multiple wounds X- 1 5 Wound Imaging (photographs - any number of wounds) [] - 0 Wound Tracing (instead of photographs) [] - 0 Simple Wound Measurement - one wound X- 2 5 Complex Wound Measurement - multiple wounds INTERVENTIONS - Wound Dressings X - Small Wound Dressing one or multiple wounds 1 10 [] - 0 Medium Wound Dressing one or multiple wounds X- 1 20 Large Wound Dressing one or multiple wounds [] - 0 Application of Medications - topical [] - 0 Application of Medications - injection INTERVENTIONS - Miscellaneous [] - 0 External ear exam [] - 0 Specimen Collection (cultures, biopsies, blood, body fluids, etc.) [] - 0 Specimen(s) / Culture(s) sent or taken to Lab for analysis [] - 0 Patient Transfer (multiple staff / Civil Service fast streamer / Similar devices) [] - 0 Simple Staple / Suture removal (25 or less) [] - 0 Complex Staple / Suture removal (26 or more) [] - 0 Hypo / Hyperglycemic Management (close monitor of Blood Glucose) [] - 0 Ankle / Brachial Index (ABI) - do not check if billed separately [] - 0 Vital Signs Has the patient been seen at the hospital within the last three years: Yes Total Score: 160 Level Of Care: New/Established - Level 5 Electronic Signature(s) Signed: 09/23/2019 5:33:37 PM By: Deon Pilling Entered By: Deon Pilling on 09/23/2019 08:17:34 -------------------------------------------------------------------------------- Encounter Discharge Information Details Patient Name: Date of Service: [name] Birchwood. 09/23/2019 7:30 A M Medical Record Number: [id number] Patient Account Number: [id number] Date of Birth/Sex: Treating RN: 05/17/1961 (59 y.o. [name] Primary Care Shatonya Passon: [name] Other Clinician: Referring  Kaysia Willard: Treating Garlan Drewes/Extender: Kendell Bane in Treatment: 114 Encounter Discharge Information Items Discharge Condition: Stable Ambulatory Status: Wheelchair Discharge Destination: Home Transportation: Other Accompanied By: caregiver Schedule Follow-up Appointment: Yes Clinical Summary of Care: Patient Declined Notes facility transportation Electronic Signature(s) Signed: 09/23/2019 5:04:41 PM By: Baruch Gouty RN, BSN Entered By: Baruch Gouty on 09/23/2019 08:38:14 -------------------------------------------------------------------------------- Lower Extremity Assessment Details Patient Name: Date of Service: [name], [name] 09/23/2019 7:30 A M Medical Record Number: [id number] Patient Account Number: [id number] Date of Birth/Sex: Treating RN: 09-27-1960 (58 y.o. [name] Primary Care Dylin Breeden: [name] Other Clinician: Referring Demone Lyles: Treating Herron Fero/Extender: Kendell Bane in Treatment: 114 Edema Assessment Assessed: Shirlyn Goltz: No] Patrice Paradise: No] Edema: [Left: Ye] [Right: s] Calf Left: Right: Point of Measurement: cm From Medial Instep cm 26 cm Ankle Left: Right: Point of Measurement: cm From Medial Instep cm 19 cm Vascular Assessment Pulses: Dorsalis Pedis Palpable: [Right:Yes] Electronic Signature(s) Signed: 09/23/2019 5:19:50 PM By: Kela Millin Entered By: Kela Millin on 09/23/2019 07:54:39 -------------------------------------------------------------------------------- Multi Wound Chart Details Patient Name: Date of Service: [name] Birchwood. 09/23/2019 7:30 A M Medical Record Number: [id number] Patient Account Number: [id number] Date of Birth/Sex: Treating RN: 1961/03/22 (59 y.o. [name] Primary Care Carlyn Lemke: [name] Other Clinician: Referring Luvern Mischke: Treating Chistine Dematteo/Extender: Kendell Bane in Treatment: 114 Vital  Signs Height(in): 73 Pulse(bpm): 85 Weight(lbs): 178 Blood Pressure(mmHg): 149/84 Body Mass Index(BMI): 23 Temperature(F): 98 Respiratory Rate(breaths/min): 19 Photos: [37:No Photos Right, Lateral Foot] [38:No Photos Right T Fifth oe] [N/A:N/A N/A] Wound Location: [37:Pressure Injury] [38:Shear/Friction] [N/A:N/A] Wounding Event: [37:Pressure Ulcer] [38:Pressure Ulcer] [N/A:N/A] Primary Etiology: [37:Deep Vein Thrombosis, Colitis,] [38:Deep Vein Thrombosis, Colitis,] [N/A:N/A] Comorbid History: [37:Osteomyelitis, Neuropathy, Quadriplegia, Confinement Anxiety 05/24/2019] [38:Osteomyelitis, Neuropathy, Quadriplegia, Confinement Anxiety 07/21/2019] [N/A:N/A] Date Acquired: [16:94] [38:9] [N/A:N/A] Weeks of Treatment: [37:Open] [38:Open] [N/A:N/A] Wound Status: [37:4.2x2.7x0.2] [38:0.5x0.8x0.1] [N/A:N/A] Measurements L

## 2019-10-20 ENCOUNTER — Telehealth: Payer: Self-pay

## 2019-10-20 NOTE — Telephone Encounter (Signed)
Returned a call after someone left a voicemail about getting [name] rescheduled, they didn't leave their name but I called and the phone was very glitchy I called back but it then said the operator wasn't available .

## 2019-10-22 ENCOUNTER — Ambulatory Visit (INDEPENDENT_AMBULATORY_CARE_PROVIDER_SITE_OTHER): Payer: Medicare Other | Admitting: Podiatry

## 2019-10-22 ENCOUNTER — Other Ambulatory Visit: Payer: Self-pay

## 2019-10-22 DIAGNOSIS — Z9889 Other specified postprocedural states: Secondary | ICD-10-CM

## 2019-10-22 DIAGNOSIS — L97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 layer exposed: Secondary | ICD-10-CM

## 2019-11-03 ENCOUNTER — Encounter: Payer: Self-pay | Admitting: Infectious Disease

## 2019-11-03 ENCOUNTER — Ambulatory Visit (INDEPENDENT_AMBULATORY_CARE_PROVIDER_SITE_OTHER): Payer: Medicare Other | Admitting: Infectious Disease

## 2019-11-03 ENCOUNTER — Other Ambulatory Visit: Payer: Self-pay

## 2019-11-03 VITALS — BP 125/79 | HR 67 | Temp 98.1°F

## 2019-11-03 DIAGNOSIS — M86171 Other acute osteomyelitis, right ankle and foot: Secondary | ICD-10-CM | POA: Diagnosis present

## 2019-11-03 DIAGNOSIS — A498 Other bacterial infections of unspecified site: Secondary | ICD-10-CM

## 2019-11-03 DIAGNOSIS — R9431 Abnormal electrocardiogram [ECG] [EKG]: Secondary | ICD-10-CM

## 2019-11-03 DIAGNOSIS — A4902 Methicillin resistant Staphylococcus aureus infection, unspecified site: Secondary | ICD-10-CM | POA: Diagnosis not present

## 2019-11-03 DIAGNOSIS — G8250 Quadriplegia, unspecified: Secondary | ICD-10-CM

## 2019-11-03 MED ORDER — CIPROFLOXACIN HCL 500 MG PO TABS: 500.0000 mg | ORAL_TABLET | Freq: Two times a day (BID) | ORAL | 0 refills | Status: DC

## 2019-11-03 NOTE — Patient Instructions (Signed)
I am adding ciprofloxacin  Please stop the tizanadine as this medication is contraindicated with cipro  I also need you to see PCP and have EKG obtained by them while you are on the cipro

## 2019-11-03 NOTE — Progress Notes (Signed)
Subjective:  Chief complaint: Follow-up for foot infection  Patient ID: AXELL TRIGUEROS, male    DOB: Apr 27, 1961, 59 y.o.   MRN: [id number]  HPI  Giles is a 59 year old Caucasian man with a history of cerebral palsy, bipolar disorder anxiety depression mental retardation who is quadriplegic and has been suffering from pressure ulcers over many years.  More recently he has been dealing with a ulcer on his right lateral foot which is failed to heal despite months if not greater than a year of being present.  He has had MRSA grow from the wound as well as Pseudomonas.  He is being followed closely of the wound center by Allen Derry and Dr. Leanord Hawking.  He was recently also seen by Dr. Samuella Cota with podiatry.  There is been concern about this wound failing to heal up and anxiety about osteomyelitis which is certainly understandable.  Plain films did not show evidence of osteomyelitis but I also have some anxiety about this.  He was treated with various oral antibiotics recently including clindamycin doxycycline.  He has not received antibiotics targeting Pseudomonas due to anxiety but QT prolongation in the context of him also being on Risperdal.  I saw the patient in the clinic on 12 April.  Interim he underwent MRI of the foot on 26 April which showed soft tissue ulceration of the lateral midfoot with osteomyelitis of the proximal half of the fifth metatarsal.  Is seen by wound care and podiatry and underwent ultimately a right fifth ray amputation I was an inpatient at [facility] long hospital.  Cultures did show Pseudomonas aeruginosa again.  Ninetta Lights saw the patient in the hospital and felt with the amputation seeming curative in terms of his osteomyelitis the patient could be observed off antibiotics.  He has been ultimately discharged and seen again by podiatry but my understanding is they are worried about possibility of remaining soft tissue infection.  Given that he has Pseudomonas the only option  we have as an oral agent is ciprofloxacin.        Past Medical History:  Diagnosis Date  . Anemia    microcytic anemia   . Anxiety    takes Ativan daily as needed  . Bipolar disorder (HCC)    takes Depakote daily  . CEREBRAL PALSY 07/24/2006  . Chronic bronchitis (HCC)    "gets it q yr"  . Chronic bronchitis (HCC)   . 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)   . Constipation    takes Miralax daily as needed  . Depression    takes Risperdal nightly  . Environmental allergies    takes Zyrtec daily  . GASTRIC ULCER ACUTE WITH HEMORRHAGE 07/24/2006  . Gastric ulcer with hemorrhage    hx of   . GERD (gastroesophageal reflux disease)    takes Pravacid daily  . History of blood transfusion    no abnormal reaction noted  . History of hiatal hernia   . History of shingles   . History of stomach ulcers ~ 1996  . Hypercholesterolemia   . Intellectual functioning disability   . Intermittent explosive disorder   . Internal hemorrhoids   . MRSA infection 09/06/2019  . Obsessive compulsive disorder   . Osteomyelitis (HCC)    right foot   . Pneumonia "quite a few times"   in 2015 and in June 2016  . Pressure ulcer of sacral region    hx of   . Pseudomonas aeruginosa infection 09/06/2019  . Psychotic  disorder (HCC)   . Recurrent UTI (urinary tract infection)   . Seborrheic dermatitis   . Sigmoid diverticulosis   . Spastic tetraplegia (HCC)   . URINARY INCONTINENCE 02/09/2010   takes Detrol daily-occasionally incontinence  . Vitamin D deficiency   . Weakness    numbness in extremities    Past Surgical History:  Procedure Laterality Date  . AMPUTATION Right 12/14/2014   Procedure: AMPUTATION DIGIT RIGHT GREAT TOE MTP;  Surgeon: Nadara Mustard, MD;  Location: [facility] OR;  Service: Orthopedics;  Laterality: Right;  . AMPUTATION Right 10/08/2019   Procedure: RIGHT FIFTH RAY AMPUTATION, ADJACENT TISSUE TRANSFER;  Surgeon: Park Liter, DPM;  Location: [facility];   Service: Podiatry;  Laterality: Right;  . BIOPSY  08/12/2018   Procedure: BIOPSY;  Surgeon: Lynann Bologna, MD;  Location: [facility];  Service: Gastroenterology;;  . COLONOSCOPY WITH PROPOFOL N/A 08/14/2018   Procedure: COLONOSCOPY WITH PROPOFOL;  Surgeon: Lynann Bologna, MD;  Location: [facility];  Service: Gastroenterology;  Laterality: N/A;  . ESOPHAGOGASTRODUODENOSCOPY (EGD) WITH PROPOFOL N/A 08/12/2018   Procedure: ESOPHAGOGASTRODUODENOSCOPY (EGD) WITH PROPOFOL;  Surgeon: Lynann Bologna, MD;  Location: [facility];  Service: Gastroenterology;  Laterality: N/A;  . head surgery  as a baby   due to "water head"   . HERNIA REPAIR    . HIATAL HERNIA REPAIR  1982  . LEG SURGERY Bilateral ~ 1967   "legs were scissored; stretched them out"    Family History  Adopted: Yes      Social History   Socioeconomic History  . Marital status: Single    Spouse name: Not on file  . Number of children: Not on file  . Years of education: Not on file  . Highest education level: Not on file  Occupational History  . Not on file  Tobacco Use  . Smoking status: Never Smoker  . Smokeless tobacco: Never Used  Substance and Sexual Activity  . Alcohol use: No  . Drug use: No  . Sexual activity: Never  Other Topics Concern  . Not on file  Social History Narrative  . Not on file   Social Determinants of Health   Financial Resource Strain:   . Difficulty of Paying Living Expenses:   Food Insecurity:   . Worried About Programme researcher, broadcasting/film/video in the Last Year:   . Barista in the Last Year:   Transportation Needs:   . Freight forwarder (Medical):   Marland Kitchen Lack of Transportation (Non-Medical):   Physical Activity:   . Days of Exercise per Week:   . Minutes of Exercise per Session:   Stress:   . Feeling of Stress :   Social Connections:   . Frequency of Communication with Friends and Family:   . Frequency of Social Gatherings with Friends and Family:   . Attends Religious Services:   .  Active Member of Clubs or Organizations:   . Attends Banker Meetings:   Marland Kitchen Marital Status:     Allergies  Allergen Reactions  . Adhesive [Tape] Rash     Current Outpatient Medications:  .  acetaminophen (TYLENOL) 500 MG tablet, Take 1,000 mg by mouth every 6 (six) hours as needed for moderate pain., Disp: , Rfl:  .  alum & mag hydroxide-simeth (MYLANTA) 200-200-20 MG/5ML suspension, Take 30 mLs by mouth every 6 (six) hours as needed for indigestion or heartburn. Use as directed as needed for nausea/indigestion per standing order, Disp: , Rfl:  .  baclofen (LIORESAL) 20 MG tablet, Take 1 tablet (20 mg total) by mouth 3 (three) times daily., Disp: 90 each, Rfl: 1 .  brompheniramine-pseudoephedrine (DIMETAPP) 1-15 MG/5ML ELIX, Take 10 mLs by mouth as needed. , Disp: , Rfl:  .  cetirizine (ZYRTEC) 10 MG tablet, Take 1 tablet (10 mg total) by mouth daily. (Patient taking differently: Take 10 mg by mouth at bedtime. ), Disp: 30 tablet, Rfl: 3 .  Cholecalciferol (VITAMIN D3) 5000 units CAPS, Take 5,000 Units by mouth daily., Disp: , Rfl:  .  clotrimazole (LOTRIMIN) 1 % cream, Apply 1 application topically 2 (two) times daily., Disp: , Rfl:  .  divalproex (DEPAKOTE SPRINKLE) 125 MG capsule, Take 500 mg by mouth 2 (two) times daily. , Disp: , Rfl:  .  EAR DROPS EARWAX AID 6.5 % OTIC solution, PLACE 5 DROPS INTO THE LEFT EAR TWICE DAILY (Patient taking differently: Place 5 drops into the left ear 2 (two) times daily. ), Disp: 15 mL, Rfl: 4 .  Elastic Bandages & Supports (JOBST ANTI-EM KNEE HIGH MED) MISC, by Does not apply route. Apply once in the morning and remove at bedtime daily, Disp: , Rfl:  .  Foot Care Products (PREVALON HEEL PROTECTOR) MISC, 1 Device by Does not apply route daily., Disp: 2 each, Rfl: 0 .  guaiFENesin-dextromethorphan (ROBITUSSIN DM) 100-10 MG/5ML syrup, Take 10 mLs by mouth every 4 (four) hours as needed for cough. , Disp: , Rfl:  .  hydrocortisone cream 1 %,  Apply 1 application topically at bedtime as needed for itching., Disp: 28 g, Rfl: 0 .  hydrocortisone valerate cream (WESTCORT) 0.2 %, Apply 1 application topically 2 (two) times daily., Disp: , Rfl:  .  iron polysaccharides (NIFEREX) 150 MG capsule, Take 150 mg by mouth daily., Disp: , Rfl:  .  ketoconazole (NIZORAL) 2 % shampoo, APPLY TOPICALLY TO AFFECTED AREA(S) TWICE WEEKLY AS DIRECTED (Patient taking differently: Apply 1 application topically daily. ), Disp: 120 mL, Rfl: 4 .  LORazepam (ATIVAN) 2 MG tablet, Take 1 mg by mouth every 4 (four) hours as needed for anxiety. , Disp: , Rfl:  .  Neomycin-Bacitracin-Polymyxin (TRIPLE ANTIBIOTIC EX), Apply 1 application topically as needed (cuts/scrapes/scratches)., Disp: , Rfl:  .  omeprazole (PRILOSEC) 40 MG capsule, Take 1 capsule (40 mg total) by mouth 2 (two) times daily., Disp: 60 capsule, Rfl: 0 .  Oyster Shell (OYSTER CALCIUM) 500 MG TABS tablet, Take 1,500 mg of elemental calcium by mouth daily., Disp: , Rfl:  .  polyethylene glycol (MIRALAX) 17 g packet, Take 17 g by mouth daily as needed for moderate constipation., Disp: , Rfl:  .  risperiDONE (RISPERDAL) 2 MG tablet, Take 2 mg by mouth at bedtime., Disp: , Rfl:  .  tamsulosin (FLOMAX) 0.4 MG CAPS capsule, TAKE 1 CAPSULE BY MOUTH EVERY DAY *DO NOT CRUSH* (Patient taking differently: Take 0.4 mg by mouth daily. ), Disp: 30 capsule, Rfl: 2 .  tiZANidine (ZANAFLEX) 2 MG tablet, Take 2 mg by mouth 3 (three) times daily. , Disp: , Rfl:  .  tolterodine (DETROL LA) 2 MG 24 hr capsule, Take 1 capsule (2 mg total) by mouth daily., Disp: 90 capsule, Rfl: 3 .  tretinoin (RETIN-A) 0.025 % gel, Apply 1 application topically at bedtime., Disp: , Rfl:  .  vitamin C (VITAMIN C) 250 MG tablet, Take 1 tablet (250 mg total) by mouth daily., Disp: 30 tablet, Rfl: 0 .  VOLTAREN 1 % GEL, APPLY 4 GRAMS TOPICALLY TO AFFECTED  AREA(S) FOUR TIMES DAILY (Patient taking differently: Apply 2 g topically 4 (four) times  daily. ), Disp: 100 g, Rfl: 11 .  Zinc Oxide 40 % PSTE, Apply thin layer topically to buttocks and scrotum BID as needed. (Patient taking differently: Apply 1 application topically 2 (two) times daily as needed. ), Disp: 113 g, Rfl: 3   Review of Systems  Unable to perform ROS: Dementia       Objective:   Physical Exam HENT:     Head: Normocephalic.     Nose: Nose normal.  Cardiovascular:     Rate and Rhythm: Normal rate.  Pulmonary:     Effort: Pulmonary effort is normal. No respiratory distress.  Abdominal:     General: Abdomen is flat.  Neurological:     Mental Status: He is alert. Mental status is at baseline.     Motor: Weakness present.  Psychiatric:        Mood and Affect: Affect is flat.        Speech: Speech is delayed.        Behavior: Behavior is slowed.        Cognition and Memory: Cognition is impaired. Memory is impaired.    Left foot today  11/03/2019:           Assessment & Plan:   Osteomyelitis involving fifth metatarsal status post amputation Pseudomonas aeruginosa was isolated yet again from operative cultures.   His amputation was felt to be cured of osteomyelitis he was not given further antibiotics.  My understanding however there is anxiety about there being potential still soft tissue infection remaining.  If this is the case he will need further debridement of the soft tissue.  In the interim however I am going to start ciprofloxacin to cover his Pseudomonas infection.  According to the drug reconciliation he should not be on Zanaflex while on ciprofloxacin as the ofloxacin can cause elevated levels of Zanaflex that could cause hypotension.  There is also concern about QT prolongation given the fact he is on Risperdal.  I would like him to have a EKG done at his primary care physician's office after initiating ciprofloxacin.  CPalsy with quadriplegia and spasticity: Above regarding Zanaflex.  QT prolongation: See above he should have  EKG done at primary care physician's office

## 2019-11-05 ENCOUNTER — Ambulatory Visit: Payer: Self-pay | Admitting: Podiatry

## 2019-11-05 ENCOUNTER — Ambulatory Visit (INDEPENDENT_AMBULATORY_CARE_PROVIDER_SITE_OTHER): Payer: Medicare Other | Admitting: Podiatry

## 2019-11-05 ENCOUNTER — Other Ambulatory Visit: Payer: Self-pay

## 2019-11-05 DIAGNOSIS — L97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 layer exposed: Secondary | ICD-10-CM

## 2019-11-05 NOTE — Patient Instructions (Signed)
Pre-Operative Instructions  Congratulations, you have decided to take an important step towards improving your quality of life.  You can be assured that the doctors and staff at [facility] will be with you every step of the way.  Here are some important things you should know:  1. Plan to be at the surgery center/hospital at least 1 (one) hour prior to your scheduled time, unless otherwise directed by the surgical center/hospital staff.  You must have a responsible adult accompany you, remain during the surgery and drive you home.  Make sure you have directions to the surgical center/hospital to ensure you arrive on time. 2. If you are having surgery at [facility] or [facility] hospitals, you will need a copy of your medical history and physical form from your family physician within one month prior to the date of surgery. We will give you a form for your primary physician to complete.  3. We make every effort to accommodate the date you request for surgery.  However, there are times where surgery dates or times have to be moved.  We will contact you as soon as possible if a change in schedule is required.   4. No aspirin/ibuprofen for one week before surgery.  If you are on aspirin, any non-steroidal anti-inflammatory medications (Mobic, Aleve, Ibuprofen) should not be taken seven (7) days prior to your surgery.  You make take Tylenol for pain prior to surgery.  5. Medications - If you are taking daily heart and blood pressure medications, seizure, reflux, allergy, asthma, anxiety, pain or diabetes medications, make sure you notify the surgery center/hospital before the day of surgery so they can tell you which medications you should take or avoid the day of surgery. 6. No food or drink after midnight the night before surgery unless directed otherwise by surgical center/hospital staff. 7. No alcoholic beverages 24-hours prior to surgery.  No smoking 24-hours prior or 24-hours after  surgery. 8. Wear loose pants or shorts. They should be loose enough to fit over bandages, boots, and casts. 9. Don't wear slip-on shoes. Sneakers are preferred. 10. Bring your boot with you to the surgery center/hospital.  Also bring crutches or a walker if your physician has prescribed it for you.  If you do not have this equipment, it will be provided for you after surgery. 11. If you have not been contacted by the surgery center/hospital by the day before your surgery, call to confirm the date and time of your surgery. 12. Leave-time from work may vary depending on the type of surgery you have.  Appropriate arrangements should be made prior to surgery with your employer. 13. Prescriptions will be provided immediately following surgery by your doctor.  Fill these as soon as possible after surgery and take the medication as directed. Pain medications will not be refilled on weekends and must be approved by the doctor. 14. Remove nail polish on the operative foot and avoid getting pedicures prior to surgery. 15. Wash the night before surgery.  The night before surgery wash the foot and leg well with water and the antibacterial soap provided. Be sure to pay special attention to beneath the toenails and in between the toes.  Wash for at least three (3) minutes. Rinse thoroughly with water and dry well with a towel.  Perform this wash unless told not to do so by your physician.  Enclosed: 1 Ice pack (please put in freezer the night before surgery)   1 Hibiclens skin cleaner     Pre-op instructions  If you have any questions regarding the instructions, please do not hesitate to call our office.  [place]: [address], [place], [place] [address] -- [phone]  [place]: [address] [place], [place] [address] -- [phone]  [place]: [address], [place], [place] [address] -- [phone]   Website: [email or web address] 

## 2019-11-08 NOTE — Progress Notes (Signed)
  Subjective:  Patient ID: [name], male    DOB: 21-Oct-1960,  MRN: [id number]  Chief Complaint  Patient presents with  . Wound Check    MRI results and wound check right 5th digit.    59 y.o. male presents for wound care. Hx confirmed with patient. Here for f/u of MRI s/o OM. Objective:  Physical Exam: Wound Location: Right lateral foot Wound Measurement: 4x2.5 Wound Base: Granular/Healthy Peri-wound: Normal Exudate: Scant/small amount Serosanguinous exudate wound without warmth, erythema, signs of acute infection Assessment:   1. Ulcerated, foot, right, with fat layer exposed (HCC)   2. Chronic osteomyelitis involving right ankle and foot (HCC)   3. Ulcerated, foot, right, with necrosis of bone (HCC)   4. Cerebral palsy, quadriplegic (HCC)     Plan:  Patient was evaluated and treated and all questions answered.  Ulcer Right foot -MRI reviewed, c/w OM. -Wound dressed with betadine WTD. -Patient has failed all conservative therapy and wishes to proceed with surgical intervention. All risks, benefits, and alternatives discussed with patient. No guarantees given. Consent reviewed, to be signed by POA -Planned procedures: right 5th ray resection, wound excision

## 2019-11-08 NOTE — Progress Notes (Addendum)
Preop instructions for:  [name]                        Date of Birth  2060-09-29                           Date of Procedure:  11/12/2019       Doctor: Dr Ventura Sellers  Time to arrive at [facility]:1130am  Report to: Admitting  Procedure:right foot wound debridement skin graft substitute application    Do not eat  past midnight the night before your procedure.(To include any tube feedings-must be discontinued) May have clear liquids from until 1130 am morning of procedure then nothing by mouth.      CLEAR LIQUID DIET   Foods Allowed                                                                     Foods Excluded  Coffee and tea, regular and decaf                             liquids that you cannot  Plain Jell-O any favor except red or purple                                           see through such as: Fruit ices (not with fruit pulp)                                     milk, soups, orange juice  Iced Popsicles                                    All solid food Carbonated beverages, regular and diet                                    Cranberry, grape and apple juices Sports drinks like Gatorade Lightly seasoned clear broth or consume(fat free) Sugar, honey syrup  Sample Menu Breakfast                                Lunch                                     Supper Cranberry juice                    Beef broth                            Chicken broth Jell-O  Grape juice                           Apple juice Coffee or tea                        Jell-O                                      Popsicle                                                Coffee or tea                        Coffee or tea  _____________________________________________________________________     Take these morning medications only with sips of water.(or give through gastrostomy or feeding tube).  Baclofen, Cipro, Divalproex, Omeprazole     Facility contact: [place]                  Phone:                  [name] MVH:QIONG guardian- sisterCinda [name] [phone]  Transportation contact phone#:  [place]  Please send day of procedure:current med list and meds last taken that day, confirm nothing by mouth status from what time, Patient Demographic info( to include DNR status, problem list, allergies)   RN contact name/phone#:                             and Fax [employer] card and picture ID Leave all jewelry and other valuables at place where living( no metal or rings to be worn) No contact lens -- Men-no colognes,lotions  Any questions day of procedure,call  [place]-[phone]   Sent from :[facility]                   [place]                   Fax:[phone]  Sent by :  [name] Shields RN

## 2019-11-08 NOTE — Progress Notes (Signed)
Requested info of current MAR, demographic face sheet , medical history and H and P on 11/05/19 from [facility].  Called back on 11/08/19 and rerquested info above.  [facility] is to fax.

## 2019-11-10 ENCOUNTER — Encounter (HOSPITAL_COMMUNITY): Payer: Self-pay | Admitting: Podiatry

## 2019-11-10 NOTE — Progress Notes (Signed)
preop instructions faxed to [facility] with fax confirmation.

## 2019-11-10 NOTE — Progress Notes (Signed)
Need H and P .  Patient for surgery on 11/12/19.

## 2019-11-11 NOTE — Progress Notes (Signed)
[facility] has received preop instructions for surgeryon 11/12/19.

## 2019-11-12 ENCOUNTER — Encounter (HOSPITAL_COMMUNITY): Payer: Self-pay | Admitting: Podiatry

## 2019-11-12 ENCOUNTER — Encounter (HOSPITAL_COMMUNITY)
Admission: RE | Disposition: A | Discharge status: Home / Self Care | Payer: Self-pay | Source: Home / Self Care | Attending: Podiatry

## 2019-11-12 ENCOUNTER — Ambulatory Visit (HOSPITAL_COMMUNITY): Payer: Medicare Other | Admitting: Anesthesiology

## 2019-11-12 ENCOUNTER — Ambulatory Visit (HOSPITAL_COMMUNITY)
Admission: RE | Admit: 2019-11-12 | Discharge: 2019-11-12 | Disposition: A | Discharge status: Home / Self Care | Payer: Medicare Other | Attending: Podiatry | Admitting: Podiatry

## 2019-11-12 DIAGNOSIS — Z20822 Contact with and (suspected) exposure to covid-19: Secondary | ICD-10-CM | POA: Diagnosis not present

## 2019-11-12 DIAGNOSIS — Z89431 Acquired absence of right foot: Secondary | ICD-10-CM | POA: Diagnosis not present

## 2019-11-12 DIAGNOSIS — K219 Gastro-esophageal reflux disease without esophagitis: Secondary | ICD-10-CM | POA: Insufficient documentation

## 2019-11-12 DIAGNOSIS — L97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 layer exposed: Secondary | ICD-10-CM

## 2019-11-12 DIAGNOSIS — L89899 Pressure ulcer of other site, unspecified stage: Secondary | ICD-10-CM | POA: Insufficient documentation

## 2019-11-12 DIAGNOSIS — G808 Other cerebral palsy: Secondary | ICD-10-CM | POA: Diagnosis not present

## 2019-11-12 HISTORY — PX: GRAFT APPLICATION: SHX6696

## 2019-11-12 HISTORY — PX: WOUND DEBRIDEMENT: SHX247

## 2019-11-12 LAB — BASIC METABOLIC PANEL
Anion gap: 9 (ref 5–15)
BUN: 14 mg/dL (ref 6–20)
CO2: 27 mmol/L (ref 22–32)
Calcium: 9 mg/dL (ref 8.9–10.3)
Chloride: 105 mmol/L (ref 98–111)
Creatinine, Ser: 0.51 mg/dL — ABNORMAL LOW (ref 0.61–1.24)
GFR calc Af Amer: >60 mL/min (ref >60)
GFR calc non Af Amer: >60 mL/min (ref >60)
Glucose, Bld: 92 mg/dL (ref 70–99)
Potassium: 3.8 mmol/L (ref 3.5–5.1)
Sodium: 141 mmol/L (ref 135–145)

## 2019-11-12 LAB — CBC
HCT: 37.8 % — ABNORMAL LOW (ref 39.0–52.0)
Hemoglobin: 12 g/dL — ABNORMAL LOW (ref 13.0–17.0)
MCH: 25.1 pg — ABNORMAL LOW (ref 26.0–34.0)
MCHC: 31.7 g/dL (ref 30.0–36.0)
MCV: 79.1 fL — ABNORMAL LOW (ref 80.0–100.0)
Platelets: 336 10*3/uL (ref 150–400)
RBC: 4.78 MIL/uL (ref 4.22–5.81)
RDW: 18.7 % — ABNORMAL HIGH (ref 11.5–15.5)
WBC: 7.8 10*3/uL (ref 4.0–10.5)
nRBC: 0 % (ref 0.0–0.2)

## 2019-11-12 LAB — HEMOGLOBIN A1C
Hgb A1c MFr Bld: 5.9 % — ABNORMAL HIGH (ref 4.8–5.6)
Mean Plasma Glucose: 122.63 mg/dL

## 2019-11-12 LAB — SARS CORONAVIRUS 2 BY RT PCR (HOSPITAL ORDER, PERFORMED IN ~~LOC~~ HOSPITAL LAB): SARS Coronavirus 2: NEGATIVE

## 2019-11-12 SURGERY — DEBRIDEMENT, WOUND
Anesthesia: Monitor Anesthesia Care | Laterality: Right

## 2019-11-12 MED ORDER — ONDANSETRON HCL 4 MG/2ML IJ SOLN
INTRAMUSCULAR | Status: DC | PRN
  Administered 2019-11-12: 4 mg via INTRAVENOUS

## 2019-11-12 MED ORDER — OXYCODONE HCL 5 MG/5ML PO SOLN: 5.0000 mg | Freq: Once | ORAL | Status: DC | PRN

## 2019-11-12 MED ORDER — ONDANSETRON HCL 4 MG/2ML IJ SOLN: 4.0000 mg | Freq: Once | INTRAMUSCULAR | Status: DC | PRN

## 2019-11-12 MED ORDER — PROPOFOL 10 MG/ML IV BOLUS
INTRAVENOUS | Status: DC | PRN
  Administered 2019-11-12 (×2): 10 mg via INTRAVENOUS

## 2019-11-12 MED ORDER — ACETAMINOPHEN 325 MG PO TABS: 325.0000 mg | ORAL_TABLET | ORAL | Status: DC | PRN

## 2019-11-12 MED ORDER — PROPOFOL 500 MG/50ML IV EMUL
INTRAVENOUS | Status: AC
  Filled 2019-11-12: qty 50

## 2019-11-12 MED ORDER — FENTANYL CITRATE (PF) 100 MCG/2ML IJ SOLN: 25.0000 ug | INTRAMUSCULAR | Status: DC | PRN

## 2019-11-12 MED ORDER — MEPERIDINE HCL 50 MG/ML IJ SOLN: 6.2500 mg | INTRAMUSCULAR | Status: DC | PRN

## 2019-11-12 MED ORDER — OXYCODONE-ACETAMINOPHEN 5-325 MG PO TABS: 1.0000 | ORAL_TABLET | ORAL | 0 refills | Status: DC | PRN

## 2019-11-12 MED ORDER — PROPOFOL 500 MG/50ML IV EMUL
INTRAVENOUS | Status: DC | PRN
  Administered 2019-11-12: 75 ug/kg/min via INTRAVENOUS

## 2019-11-12 MED ORDER — SODIUM CHLORIDE 0.9 % IR SOLN
Status: DC | PRN
  Administered 2019-11-12: 1000 mL

## 2019-11-12 MED ORDER — LACTATED RINGERS IV SOLN: INTRAVENOUS | Status: DC

## 2019-11-12 MED ORDER — CEFAZOLIN SODIUM-DEXTROSE 2-4 GM/100ML-% IV SOLN
2.0000 g | INTRAVENOUS | Status: AC
  Administered 2019-11-12: 2 g via INTRAVENOUS
  Filled 2019-11-12: qty 100

## 2019-11-12 MED ORDER — LIDOCAINE 2% (20 MG/ML) 5 ML SYRINGE
INTRAMUSCULAR | Status: DC | PRN
  Administered 2019-11-12 (×2): 20 mg via INTRAVENOUS

## 2019-11-12 MED ORDER — VANCOMYCIN HCL 1000 MG IV SOLR
INTRAVENOUS | Status: AC
  Filled 2019-11-12: qty 1000

## 2019-11-12 MED ORDER — 0.9 % SODIUM CHLORIDE (POUR BTL) OPTIME
TOPICAL | Status: DC | PRN
  Administered 2019-11-12: 1000 mL

## 2019-11-12 MED ORDER — BUPIVACAINE HCL (PF) 0.5 % IJ SOLN
INTRAMUSCULAR | Status: DC | PRN
  Administered 2019-11-12: 10 mL

## 2019-11-12 MED ORDER — ACETAMINOPHEN 160 MG/5ML PO SOLN: 325.0000 mg | ORAL | Status: DC | PRN

## 2019-11-12 MED ORDER — BUPIVACAINE HCL (PF) 0.5 % IJ SOLN
INTRAMUSCULAR | Status: AC
  Filled 2019-11-12: qty 30

## 2019-11-12 MED ORDER — PROPOFOL 10 MG/ML IV BOLUS
INTRAVENOUS | Status: AC
  Filled 2019-11-12: qty 20

## 2019-11-12 MED ORDER — ONDANSETRON HCL 4 MG/2ML IJ SOLN
INTRAMUSCULAR | Status: AC
  Filled 2019-11-12: qty 2

## 2019-11-12 MED ORDER — CHLORHEXIDINE GLUCONATE 0.12 % MT SOLN: 15.0000 mL | Freq: Once | OROMUCOSAL | Status: AC

## 2019-11-12 MED ORDER — ORAL CARE MOUTH RINSE
15.0000 mL | Freq: Once | OROMUCOSAL | Status: AC
  Administered 2019-11-12: 15 mL via OROMUCOSAL

## 2019-11-12 MED ORDER — OXYCODONE HCL 5 MG PO TABS: 5.0000 mg | ORAL_TABLET | Freq: Once | ORAL | Status: DC | PRN

## 2019-11-12 SURGICAL SUPPLY — 53 items
BLADE HEX COATED 2.75 (ELECTRODE) ×3 IMPLANT
BLADE SURG 15 STRL LF DISP TIS (BLADE) ×1 IMPLANT
BLADE SURG 15 STRL SS (BLADE) ×3
BNDG CONFORM 4 STRL LF (GAUZE/BANDAGES/DRESSINGS) ×3 IMPLANT
BNDG ELASTIC 3X5.8 VLCR STR LF (GAUZE/BANDAGES/DRESSINGS) ×3 IMPLANT
BNDG ELASTIC 4X5.8 VLCR STR LF (GAUZE/BANDAGES/DRESSINGS) IMPLANT
BNDG ESMARK 4X9 LF (GAUZE/BANDAGES/DRESSINGS) ×3 IMPLANT
BNDG GAUZE ELAST 4 BULKY (GAUZE/BANDAGES/DRESSINGS) ×3 IMPLANT
CHLORAPREP W/TINT 26 (MISCELLANEOUS) ×3 IMPLANT
COVER BACK TABLE 60X90IN (DRAPES) ×3 IMPLANT
COVER WAND RF STERILE (DRAPES) IMPLANT
CUFF TOURN SGL QUICK 18X4 (TOURNIQUET CUFF) IMPLANT
DRAPE EXTREMITY T 121X128X90 (DISPOSABLE) ×3 IMPLANT
DRAPE IMP U-DRAPE 54X76 (DRAPES) ×3 IMPLANT
DRAPE U-SHAPE 47X51 STRL (DRAPES) ×3 IMPLANT
DRSG EMULSION OIL 3X3 NADH (GAUZE/BANDAGES/DRESSINGS) ×3 IMPLANT
DRSG PAD ABDOMINAL 8X10 ST (GAUZE/BANDAGES/DRESSINGS) IMPLANT
ELECT REM PT RETURN 15FT ADLT (MISCELLANEOUS) ×3 IMPLANT
GAUZE 4X4 16PLY RFD (DISPOSABLE) IMPLANT
GAUZE SPONGE 4X4 12PLY STRL (GAUZE/BANDAGES/DRESSINGS) ×3 IMPLANT
GLOVE BIO SURGEON STRL SZ7.5 (GLOVE) ×3 IMPLANT
GLOVE BIOGEL PI IND STRL 8 (GLOVE) ×1 IMPLANT
GLOVE BIOGEL PI INDICATOR 8 (GLOVE) ×2
GOWN STRL REUS W/ TWL LRG LVL3 (GOWN DISPOSABLE) ×1 IMPLANT
GOWN STRL REUS W/ TWL XL LVL3 (GOWN DISPOSABLE) ×1 IMPLANT
GOWN STRL REUS W/TWL LRG LVL3 (GOWN DISPOSABLE) ×3
GOWN STRL REUS W/TWL XL LVL3 (GOWN DISPOSABLE) ×3
KIT BASIN (CUSTOM PROCEDURE TRAY) ×3 IMPLANT
MANIFOLD NEPTUNE II (INSTRUMENTS) ×3 IMPLANT
MATRIX WOUND MESHED 2X2 (Tissue) ×1 IMPLANT
NEEDLE HYPO 25X1 1.5 SAFETY (NEEDLE) ×3 IMPLANT
NS IRRIG 1000ML POUR BTL (IV SOLUTION) IMPLANT
PACK ORTHO EXTREMITY (CUSTOM PROCEDURE TRAY) ×3 IMPLANT
PADDING CAST ABS 4INX4YD NS (CAST SUPPLIES) ×2
PADDING CAST ABS COTTON 4X4 ST (CAST SUPPLIES) ×1 IMPLANT
PENCIL SMOKE EVACUATOR (MISCELLANEOUS) IMPLANT
PROBE DEBRIDE SONICVAC MISONIX (TIP) ×3 IMPLANT
SET IRRIG Y TYPE TUR BLADDER L (SET/KITS/TRAYS/PACK) IMPLANT
SLEEVE SCD COMPRESS KNEE MED (MISCELLANEOUS) ×3 IMPLANT
STOCKINETTE 8 INCH (MISCELLANEOUS) ×6 IMPLANT
SUT ETHILON 4 0 PS 2 18 (SUTURE) IMPLANT
SUT MNCRL AB 3-0 PS2 18 (SUTURE) IMPLANT
SUT MNCRL AB 4-0 PS2 18 (SUTURE) IMPLANT
SUT MON AB 5-0 PS2 18 (SUTURE) IMPLANT
SUT VIC AB 3-0 FS2 27 (SUTURE) ×3 IMPLANT
SUT VICRYL 4-0 PS2 18IN ABS (SUTURE) ×3 IMPLANT
SYR BULB EAR ULCER 3OZ GRN STR (SYRINGE) ×3 IMPLANT
SYR CONTROL 10ML LL (SYRINGE) ×3 IMPLANT
TOWEL OR 17X26 10 PK STRL BLUE (TOWEL DISPOSABLE) ×3 IMPLANT
TUBE IRRIGATION SET MISONIX (TUBING) ×3 IMPLANT
UNDERPAD 30X36 HEAVY ABSORB (UNDERPADS AND DIAPERS) ×3 IMPLANT
WOUND MATRIX MESHED 2X2 (Tissue) ×2 IMPLANT
YANKAUER SUCT BULB TIP NO VENT (SUCTIONS) ×3 IMPLANT

## 2019-11-12 NOTE — Anesthesia Preprocedure Evaluation (Signed)
Anesthesia Evaluation  Patient identified by MRN, date of birth, ID band Patient awake    Reviewed: Allergy & Precautions, NPO status , Patient's Chart, lab work & pertinent test results  History of Anesthesia Complications Negative for: history of anesthetic complications  Airway Mallampati: II  TM Distance: >3 FB Neck ROM: Full    Dental no notable dental hx. (+) Dental Advisory Given   Pulmonary neg pulmonary ROS,    Pulmonary exam normal breath sounds clear to auscultation       Cardiovascular negative cardio ROS Normal cardiovascular exam Rhythm:Regular Rate:Normal     Neuro/Psych PSYCHIATRIC DISORDERS Anxiety Depression Bipolar Disorder Schizophrenia Cerebral palsy, MR, quadriplegia negative neurological ROS     GI/Hepatic Neg liver ROS, hiatal hernia, PUD, GERD  Medicated,  Endo/Other  negative endocrine ROSdiabetes  Renal/GU negative Renal ROS     Musculoskeletal  (+) Arthritis ,   Abdominal   Peds  Hematology  (+) Blood dyscrasia, anemia ,   Anesthesia Other Findings Day of surgery medications reviewed with the patient.  Reproductive/Obstetrics                             Anesthesia Physical  Anesthesia Plan  ASA: III  Anesthesia Plan: MAC   Post-op Pain Management:    Induction:   PONV Risk Score and Plan: Treatment may vary due to age or medical condition and Propofol infusion  Airway Management Planned: Natural Airway and Simple Face Mask  Additional Equipment:   Intra-op Plan:   Post-operative Plan:   Informed Consent: I have reviewed the patients History and Physical, chart, labs and discussed the procedure including the risks, benefits and alternatives for the proposed anesthesia with the patient or authorized representative who has indicated his/her understanding and acceptance.   Patient has DNR.  Continue DNR and Discussed DNR with patient.   Dental  advisory given  Plan Discussed with: CRNA and Anesthesiologist  Anesthesia Plan Comments:         Anesthesia Quick Evaluation

## 2019-11-12 NOTE — H&P (View-Only) (Signed)
Subjective:  Patient ID: [name], male    DOB: 03-20-1961,  MRN: [id number]  Chief Complaint  Patient presents with  . 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 Pt states taking Ciprofloxacin for his foot wound and is requesting a boot today. Denies fever/chills/nausea/vomiting.     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 male presents with the above complaint. History confirmed with patient.   Objective:  Physical Exam: no tenderness at the surgical site, local edema noted and calf supple, nontender.  Third toe injury with onycholysis Incision: healing well, but with eschar from pressure necrosis laterally. No warmth erythema signs of acute infection.  Results for orders placed or performed during the hospital encounter of 10/08/19  Surgical pcr screen     Status: None   Collection Time: 10/08/19 11:32 AM   Specimen: Nasal Mucosa; Nasal Swab  Result Value Ref Range Status   MRSA, PCR NEGATIVE NEGATIVE Final   Staphylococcus aureus NEGATIVE NEGATIVE Final    Comment: (NOTE) The Xpert SA Assay (FDA approved for NASAL specimens in patients 64 years of age and older), is one component of a comprehensive surveillance program. It is not intended to diagnose infection nor to guide or monitor treatment. Performed at [facility], [address], [place], [place] [address]   SARS Coronavirus 2 by RT PCR (hospital order, performed in [facility] hospital lab) Nasopharyngeal Nasopharyngeal Swab     Status: None   Collection Time: 10/08/19 11:32 AM   Specimen: Nasopharyngeal Swab  Result Value Ref Range Status   SARS Coronavirus 2 NEGATIVE NEGATIVE Final    Comment: (NOTE) SARS-CoV-2 target nucleic acids are NOT DETECTED. The SARS-CoV-2 RNA is generally detectable in upper and lower respiratory specimens during the acute phase of infection. The lowest concentration of SARS-CoV-2 viral copies  this assay can detect is 250 copies / mL. A negative result does not preclude SARS-CoV-2 infection and should not be used as the sole basis for treatment or other patient management decisions.  A negative result may occur with improper specimen collection / handling, submission of specimen other than nasopharyngeal swab, presence of viral mutation(s) within the areas targeted by this assay, and inadequate number of viral copies (<250 copies / mL). A negative result must be combined with clinical observations, patient history, and epidemiological information. Fact Sheet for Patients:   BoilerBrush.com.cy Fact Sheet for Healthcare Providers: https://pope.com/ This test is not yet approved or cleared  by the [place] FDA and has been authorized for detection and/or diagnosis of SARS-CoV-2 by FDA under an Emergency Use Authorization (EUA).  This EUA will remain in effect (meaning this test can be used) for the duration of the COVID-19 declaration under Section 564(b)(1) of the Act, 21 U.S.C. section 360bbb-3(b)(1), unless the authorization is terminated or revoked sooner. Performed at [facility], [address], [place], [place] [address]   Aerobic/Anaerobic Culture (surgical/deep wound)     Status: None   Collection Time: 10/08/19  6:15 PM   Specimen: PATH Soft tissue  Result Value Ref Range Status   Specimen Description   Final    TISSUE Performed at [facility], [address] [place], [place] [address]    Special Requests RIGHT 5 METARSAL BONE SAMPLE 2  Final   Gram Stain NO WBC SEEN NO ORGANISMS SEEN   Final   Culture   Final  RARE PSEUDOMONAS AERUGINOSA CRITICAL RESULT CALLED TO, READ BACK BY AND VERIFIED WITH: DR March Rummage [id number] FCP NO ANAEROBES ISOLATED Performed at [facility], [place] [address], [place], [place] [address]    Report Status 10/14/2019 FINAL  Final    Organism ID, Bacteria PSEUDOMONAS AERUGINOSA  Final      Susceptibility   Pseudomonas aeruginosa - MIC*    CEFTAZIDIME 4 SENSITIVE Sensitive     CIPROFLOXACIN <=0.25 SENSITIVE Sensitive     GENTAMICIN 2 SENSITIVE Sensitive     IMIPENEM 2 SENSITIVE Sensitive     PIP/TAZO 8 SENSITIVE Sensitive     CEFEPIME 8 SENSITIVE Sensitive     * RARE PSEUDOMONAS AERUGINOSA  Aerobic/Anaerobic Culture (surgical/deep wound)     Status: None   Collection Time: 10/08/19  6:15 PM   Specimen: Tissue  Result Value Ref Range Status   Specimen Description TISSUE  Final   Special Requests RIGHT FOOT POST LAVAGE SAMPLE 3  Final   Gram Stain NO WBC SEEN NO ORGANISMS SEEN   Final   Culture   Final    RARE PSEUDOMONAS AERUGINOSA CRITICAL RESULT CALLED TO, READ BACK BY AND VERIFIED WITH: DR March Rummage [id number] FCP NO ANAEROBES ISOLATED Performed at [facility], [place] [address], [place], [place] [address]    Report Status 10/14/2019 FINAL  Final   Organism ID, Bacteria PSEUDOMONAS AERUGINOSA  Final      Susceptibility   Pseudomonas aeruginosa - MIC*    CEFTAZIDIME 4 SENSITIVE Sensitive     CIPROFLOXACIN <=0.25 SENSITIVE Sensitive     GENTAMICIN 2 SENSITIVE Sensitive     IMIPENEM 2 SENSITIVE Sensitive     PIP/TAZO 8 SENSITIVE Sensitive     CEFEPIME 2 SENSITIVE Sensitive     * RARE PSEUDOMONAS AERUGINOSA   Assessment:   1. Ulcerated, foot, right, with fat layer exposed ([place])    Plan:  Patient was evaluated and treated and all questions answered.  Post-operative State -Dressing applied consisting of sterile gauze, kerlix and ACE bandage  -He continues to have pressure necrosis from the way his foot lays. The wound still looks healthy without acute infection. Some stitches and staples removed today. Remainder left intact. Would benefit from debridement of the eschar and possible skin graft substitute. CAM walker boot applied to help offload pressure -Patient has failed all conservative therapy and  wishes to proceed with surgical intervention. All risks, benefits, and alternatives discussed with patient. No guarantees given. Consent reviewed and signed by patient. -Planned procedures: right foot debridement and irrigation with application of skin graft substitute.   No follow-ups on file.

## 2019-11-12 NOTE — Op Note (Signed)
Patient Name: [name] DOB: [birth year]  MRN: [id number]   Date of Service: 11/12/19    Surgeon: Dr. Hardie Pulley, DPM Assistants: None Pre-operative Diagnosis: wound right foot Post-operative Diagnosis: same Procedures:             1) Debridement and irrigation right foot wound  2) Application of skin graft substitute. 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 It was discussed he would benefit form surgical intervention to promote healing. All risks, benefits, and alternatives discussed.   Procedure in Detail: Patient was identified in pre-operative holding area. Formal consent was signed and the right lower extremity was marked. Patient was brought back to the operating room and placed on the operating room table in the supine position. Anesthesia was induced.   The extremity was prepped and draped in the usual sterile fashion. Timeout was taken to confirm patient name, laterality, and procedure prior to incision. Attention was then directed to the right foot where a wound measuring 2x2 was encountered.The wound was cultured.  The wound was sharply excisionally debrided with a 15 blade, followed by a misonix ultrasonic debrider. Debridement was performed to bleeding, viable wound base. The wound was debrided to the level of the muscle tissue. Following debridement the wound measured 2x3.  An integra bilayer graft was applied to the wound and adhered with skin staples.  The foot was then dressed with 4x4, kerlix, and ACE bandage. Patient tolerated the procedure  well.   Disposition: Following a period of post-operative monitoring, patient will be transferred back home.

## 2019-11-12 NOTE — Interval H&P Note (Signed)
History and Physical Interval Note:  11/12/2019 3:31 PM  [name]  has presented today for surgery, with the diagnosis of Right foot ulcer.  The various methods of treatment have been discussed with the patient and family. After consideration of risks, benefits and other options for treatment, the patient has consented to  Procedure(s): DEBRIDEMENT WOUND (Right) SKIN GRAFT SUBSTITUTE APPLICATION (Right) as a surgical intervention.  The patient's history has been reviewed, patient examined, no change in status, stable for surgery.  I have reviewed the patient's chart and labs.  Questions were answered to the patient's satisfaction.     Park Liter

## 2019-11-12 NOTE — Transfer of Care (Signed)
Immediate Anesthesia Transfer of Care Note  Patient: [name]  Procedure(s) Performed: DEBRIDEMENT WOUND (Right ) SKIN GRAFT SUBSTITUTE APPLICATION (Right )  Patient Location: PACU  Anesthesia Type:MAC  Level of Consciousness: drowsy and patient cooperative  Airway & Oxygen Therapy: Patient Spontanous Breathing and Patient connected to face mask oxygen  Post-op Assessment: Report given to RN and Post -op Vital signs reviewed and stable  Post vital signs: Reviewed and stable  Last Vitals:  Vitals Value Taken Time  BP 122/84 11/12/19 1824  Temp    Pulse 70 11/12/19 1827  Resp 13 11/12/19 1827  SpO2 100 % 11/12/19 1827  Vitals shown include unvalidated device data.  Last Pain:  Vitals:   11/12/19 1323  TempSrc: Oral         Complications: No complications documented.

## 2019-11-12 NOTE — Progress Notes (Signed)
Subjective:  Patient ID: [name], male    DOB: 03-20-1961,  MRN: [id number]  Chief Complaint  Patient presents with  . 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 Pt states taking Ciprofloxacin for his foot wound and is requesting a boot today. Denies fever/chills/nausea/vomiting.     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 male presents with the above complaint. History confirmed with patient.   Objective:  Physical Exam: no tenderness at the surgical site, local edema noted and calf supple, nontender.  Third toe injury with onycholysis Incision: healing well, but with eschar from pressure necrosis laterally. No warmth erythema signs of acute infection.  Results for orders placed or performed during the hospital encounter of 10/08/19  Surgical pcr screen     Status: None   Collection Time: 10/08/19 11:32 AM   Specimen: Nasal Mucosa; Nasal Swab  Result Value Ref Range Status   MRSA, PCR NEGATIVE NEGATIVE Final   Staphylococcus aureus NEGATIVE NEGATIVE Final    Comment: (NOTE) The Xpert SA Assay (FDA approved for NASAL specimens in patients 64 years of age and older), is one component of a comprehensive surveillance program. It is not intended to diagnose infection nor to guide or monitor treatment. Performed at [facility], [address], [place], [place] [address]   SARS Coronavirus 2 by RT PCR (hospital order, performed in [facility] hospital lab) Nasopharyngeal Nasopharyngeal Swab     Status: None   Collection Time: 10/08/19 11:32 AM   Specimen: Nasopharyngeal Swab  Result Value Ref Range Status   SARS Coronavirus 2 NEGATIVE NEGATIVE Final    Comment: (NOTE) SARS-CoV-2 target nucleic acids are NOT DETECTED. The SARS-CoV-2 RNA is generally detectable in upper and lower respiratory specimens during the acute phase of infection. The lowest concentration of SARS-CoV-2 viral copies  this assay can detect is 250 copies / mL. A negative result does not preclude SARS-CoV-2 infection and should not be used as the sole basis for treatment or other patient management decisions.  A negative result may occur with improper specimen collection / handling, submission of specimen other than nasopharyngeal swab, presence of viral mutation(s) within the areas targeted by this assay, and inadequate number of viral copies (<250 copies / mL). A negative result must be combined with clinical observations, patient history, and epidemiological information. Fact Sheet for Patients:   BoilerBrush.com.cy Fact Sheet for Healthcare Providers: https://pope.com/ This test is not yet approved or cleared  by the [place] FDA and has been authorized for detection and/or diagnosis of SARS-CoV-2 by FDA under an Emergency Use Authorization (EUA).  This EUA will remain in effect (meaning this test can be used) for the duration of the COVID-19 declaration under Section 564(b)(1) of the Act, 21 U.S.C. section 360bbb-3(b)(1), unless the authorization is terminated or revoked sooner. Performed at [facility], [address], [place], [place] [address]   Aerobic/Anaerobic Culture (surgical/deep wound)     Status: None   Collection Time: 10/08/19  6:15 PM   Specimen: PATH Soft tissue  Result Value Ref Range Status   Specimen Description   Final    TISSUE Performed at [facility], [address] [place], [place] [address]    Special Requests RIGHT 5 METARSAL BONE SAMPLE 2  Final   Gram Stain NO WBC SEEN NO ORGANISMS SEEN   Final   Culture   Final  RARE PSEUDOMONAS AERUGINOSA CRITICAL RESULT CALLED TO, READ BACK BY AND VERIFIED WITH: DR March Rummage [id number] FCP NO ANAEROBES ISOLATED Performed at [facility], [place] [address], [place], [place] [address]    Report Status 10/14/2019 FINAL  Final    Organism ID, Bacteria PSEUDOMONAS AERUGINOSA  Final      Susceptibility   Pseudomonas aeruginosa - MIC*    CEFTAZIDIME 4 SENSITIVE Sensitive     CIPROFLOXACIN <=0.25 SENSITIVE Sensitive     GENTAMICIN 2 SENSITIVE Sensitive     IMIPENEM 2 SENSITIVE Sensitive     PIP/TAZO 8 SENSITIVE Sensitive     CEFEPIME 8 SENSITIVE Sensitive     * RARE PSEUDOMONAS AERUGINOSA  Aerobic/Anaerobic Culture (surgical/deep wound)     Status: None   Collection Time: 10/08/19  6:15 PM   Specimen: Tissue  Result Value Ref Range Status   Specimen Description TISSUE  Final   Special Requests RIGHT FOOT POST LAVAGE SAMPLE 3  Final   Gram Stain NO WBC SEEN NO ORGANISMS SEEN   Final   Culture   Final    RARE PSEUDOMONAS AERUGINOSA CRITICAL RESULT CALLED TO, READ BACK BY AND VERIFIED WITH: DR March Rummage [id number] FCP NO ANAEROBES ISOLATED Performed at [facility], [place] [address], [place], [place] [address]    Report Status 10/14/2019 FINAL  Final   Organism ID, Bacteria PSEUDOMONAS AERUGINOSA  Final      Susceptibility   Pseudomonas aeruginosa - MIC*    CEFTAZIDIME 4 SENSITIVE Sensitive     CIPROFLOXACIN <=0.25 SENSITIVE Sensitive     GENTAMICIN 2 SENSITIVE Sensitive     IMIPENEM 2 SENSITIVE Sensitive     PIP/TAZO 8 SENSITIVE Sensitive     CEFEPIME 2 SENSITIVE Sensitive     * RARE PSEUDOMONAS AERUGINOSA   Assessment:   1. Ulcerated, foot, right, with fat layer exposed ([place])    Plan:  Patient was evaluated and treated and all questions answered.  Post-operative State -Dressing applied consisting of sterile gauze, kerlix and ACE bandage  -He continues to have pressure necrosis from the way his foot lays. The wound still looks healthy without acute infection. Some stitches and staples removed today. Remainder left intact. Would benefit from debridement of the eschar and possible skin graft substitute. CAM walker boot applied to help offload pressure -Patient has failed all conservative therapy and  wishes to proceed with surgical intervention. All risks, benefits, and alternatives discussed with patient. No guarantees given. Consent reviewed and signed by patient. -Planned procedures: right foot debridement and irrigation with application of skin graft substitute.   No follow-ups on file.

## 2019-11-12 NOTE — Anesthesia Postprocedure Evaluation (Signed)
Anesthesia Post Note  Patient: [name]  Procedure(s) Performed: DEBRIDEMENT WOUND (Right ) SKIN GRAFT SUBSTITUTE APPLICATION (Right )     Patient location during evaluation: PACU Anesthesia Type: MAC Level of consciousness: awake and alert Pain management: pain level controlled Vital Signs Assessment: post-procedure vital signs reviewed and stable Respiratory status: spontaneous breathing, nonlabored ventilation, respiratory function stable and patient connected to nasal cannula oxygen Cardiovascular status: stable and blood pressure returned to baseline Postop Assessment: no apparent nausea or vomiting Anesthetic complications: no   No complications documented.  Last Vitals:  Vitals:   11/12/19 1830 11/12/19 1840  BP: 119/85 117/83  Pulse: 66 68  Resp: 10 13  Temp:  (!) 36.4 C  SpO2: 97% 98%    Last Pain:  Vitals:   11/12/19 1840  TempSrc:   PainSc: 0-No pain                 Zarian Colpitts DAVID

## 2019-11-14 NOTE — Progress Notes (Signed)
  Subjective:  Patient ID: [name], male    DOB: 1960-08-02,  MRN: [id number]  Chief Complaint  Patient presents with  . Post-op Problem    DOS 10/08/2019 - R foot. Pt stated, "Doing very well - only one tender spot. No fever/chills/N&V".    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 male presents with the above complaint. History confirmed with patient.   Objective:  Physical Exam: no tenderness at the surgical site, local edema noted and calf supple, nontender.  Incision: healing well, no significant drainage, no dehiscence, no significant erythema   Assessment:   1. Post-operative state   2. Ulcerated, foot, right, with fat layer exposed ([facility])    Plan:  Patient was evaluated and treated and all questions answered.  Post-operative State -Dressing applied consisting of sterile gauze, kerlix and ACE bandage  -Wound has some areas of pressure necrosis. Dressed with hydrogel and DSD. - Keep F/u with ID. - F/u in 1 week for suture removal.    No follow-ups on file.

## 2019-11-15 ENCOUNTER — Encounter (HOSPITAL_COMMUNITY): Payer: Self-pay | Admitting: Podiatry

## 2019-11-15 LAB — AEROBIC CULTURE W GRAM STAIN (SUPERFICIAL SPECIMEN)

## 2019-11-17 LAB — ANAEROBIC CULTURE

## 2019-11-18 ENCOUNTER — Ambulatory Visit (INDEPENDENT_AMBULATORY_CARE_PROVIDER_SITE_OTHER): Payer: Medicare Other | Admitting: Podiatry

## 2019-11-18 ENCOUNTER — Other Ambulatory Visit: Payer: Self-pay

## 2019-11-18 DIAGNOSIS — Z9889 Other specified postprocedural states: Secondary | ICD-10-CM

## 2019-11-18 DIAGNOSIS — L97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 layer exposed: Secondary | ICD-10-CM

## 2019-11-19 ENCOUNTER — Telehealth: Payer: Self-pay | Admitting: Podiatry

## 2019-11-19 NOTE — Telephone Encounter (Signed)
keesa from home health called needing orders for care on the pt her contact number is [phone]

## 2019-11-23 ENCOUNTER — Telehealth: Payer: Self-pay | Admitting: Podiatry

## 2019-11-23 NOTE — Progress Notes (Signed)
Subjective: [name] is a 59 y.o. is seen today in office s/p right foot wound debridement, Integra bilayer graft appliction preformed on 11/12/2019 with Dr. Samuella Cota.  Presents here with a caregiver.  Feels that the wound is healing he has no significant discomfort today.  Denies any systemic complaints such as fevers, chills, nausea, vomiting. No calf pain, chest pain, shortness of breath.   Objective: General: No acute distress, AAOx3  DP/PT pulses palpable Right foot lateral wounds with graft intact which appear to be incorporating.  There is no surrounding erythema, ascending cellulitis there is no drainage or pus.  No significant tenderness palpation.  No obvious signs of infection are noted today. No other open lesions or pre-ulcerative lesions.  No pain with calf compression, swelling, warmth, erythema.       Assessment and Plan:  Status post right foot wound debridement, graft application  -Treatment options discussed including all alternatives, risks, and complications -Today he seems to be doing well.  Dressing was reapplied with Adaptic followed by dry sterile dressing.  He does have home health care is also been changing the dressing. -Elevation -Pain medication as needed. -Monitor for any clinical signs or symptoms of infection and DVT/PE and directed to call the office immediately should any occur or go to the ER. -Follow-up as scheduled with Dr. Samuella Cota or sooner if any problems arise. In the meantime, encouraged to call the office with any questions, concerns, change in symptoms.   Ovid Curd, DPM

## 2019-11-23 NOTE — Telephone Encounter (Signed)
kecia from home health is calling back she needs to know the orders for this pt please assist

## 2019-11-23 NOTE — Telephone Encounter (Signed)
I informed [name] of Dr. Gabriel Rung orders.

## 2019-11-23 NOTE — Telephone Encounter (Signed)
Continue the same that Dr. Samuella Cota ordered- 3 times a week

## 2019-11-23 NOTE — Telephone Encounter (Signed)
keissa from [facility] home [facility] called wanting to know if orders are the same or if there any changes to the order please assist keissa call back #[phone]

## 2019-11-24 ENCOUNTER — Ambulatory Visit: Payer: Medicare Other | Admitting: Podiatry

## 2019-11-25 ENCOUNTER — Telehealth: Payer: Self-pay | Admitting: Podiatry

## 2019-11-25 NOTE — Telephone Encounter (Signed)
Crystal from [facility] wanted to know if Dr.Price is to  re new orders for pt if so he needs to review and update please assist

## 2019-11-25 NOTE — Telephone Encounter (Signed)
Dr. Samuella Cota through Secure Chat ordered Adaptic and a dry sterile dressing to the wound twice weekly. I spoke with [facility] and informed Dr. Samuella Cota wanted Adaptic over the wound and dry sterile dressing 2 x week.

## 2019-11-26 ENCOUNTER — Ambulatory Visit (INDEPENDENT_AMBULATORY_CARE_PROVIDER_SITE_OTHER): Payer: Medicare Other | Admitting: Podiatry

## 2019-11-26 ENCOUNTER — Telehealth: Payer: Self-pay | Admitting: *Deleted

## 2019-11-26 ENCOUNTER — Other Ambulatory Visit: Payer: Self-pay

## 2019-11-26 DIAGNOSIS — Z9889 Other specified postprocedural states: Secondary | ICD-10-CM

## 2019-11-26 DIAGNOSIS — L97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 layer exposed: Secondary | ICD-10-CM

## 2019-11-26 MED ORDER — SILVER SULFADIAZINE 1 % EX CREA
1.0000 | TOPICAL_CREAM | Freq: Every day | CUTANEOUS | 3 refills | Status: DC
Start: 2019-11-26 — End: 2020-02-07

## 2019-11-26 NOTE — Addendum Note (Signed)
Addended by: Alphia Kava D on: 11/26/2019 03:41 PM   Modules accepted: Orders

## 2019-11-26 NOTE — Telephone Encounter (Signed)
-----   Message from Park Liter, DPM sent at 11/26/2019  2:28 PM EDT ----- New [facility] orders for silvadene and DSD to wound three times weekly

## 2019-11-26 NOTE — Telephone Encounter (Signed)
I informed pt's caregiver - Aram Beecham of the silvadene orders at the [facility] and she said they would deliver.

## 2019-11-26 NOTE — Progress Notes (Signed)
  Subjective:  Patient ID: [name], male    DOB: 1960/12/11,  MRN: [id number]  Chief Complaint  Patient presents with  . Routine Post Op    POV#2 DOS 6.18.2021 RT FOOT WOUND DEBRIDEMENT W/ POSSIBLE APPLICATION OF SKIN GRAFT. Pt states healing well, no concerns, denies fever/chills/nausea/vomiting.   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 male presents with the above complaint. History confirmed with patient.   Objective:  Physical Exam: no tenderness at the surgical site, local edema noted and calf supple, nontender.  Third toe injury with onycholysis Incision: healing well wound base mostly granular.  Assessment:   1. Ulcerated, foot, right, with fat layer exposed ([facility])   2. Post-operative state    Plan:  Patient was evaluated and treated and all questions answered.  Post-operative State -Dressing applied consisting of silvadene sterile gauze, kerlix and ACE bandage  -[facility] to apply silvadene and DSD three times weekly   Return in about 3 weeks (around 12/17/2019) for Wound Care.

## 2019-11-26 NOTE — Telephone Encounter (Signed)
I informed [facility] of Dr. Samuella Cota 11/26/2019 2:28pm orders.

## 2019-12-02 ENCOUNTER — Other Ambulatory Visit: Payer: Self-pay

## 2019-12-02 ENCOUNTER — Telehealth (INDEPENDENT_AMBULATORY_CARE_PROVIDER_SITE_OTHER): Payer: Medicare Other | Admitting: Infectious Disease

## 2019-12-02 DIAGNOSIS — G809 Cerebral palsy, unspecified: Secondary | ICD-10-CM

## 2019-12-02 DIAGNOSIS — S98131A Complete traumatic amputation of one right lesser toe, initial encounter: Secondary | ICD-10-CM

## 2019-12-02 DIAGNOSIS — A498 Other bacterial infections of unspecified site: Secondary | ICD-10-CM

## 2019-12-02 DIAGNOSIS — L97514 Non-pressure chronic ulcer of other part of right foot with necrosis of bone: Secondary | ICD-10-CM

## 2019-12-02 DIAGNOSIS — M86171 Other acute osteomyelitis, right ankle and foot: Secondary | ICD-10-CM | POA: Diagnosis not present

## 2019-12-02 NOTE — Progress Notes (Signed)
Virtual Visit via Telephone Note  I connected with [name] on 12/02/19 at 10:00 AM EDT by telephone and verified that I am speaking with the correct person using two identifiers.  Location: Patient: Home Provider: [facility]   I discussed the limitations, risks, security and privacy concerns of performing an evaluation and management service by telephone and the availability of in person appointments. I also discussed with the patient that there may be a patient responsible charge related to this service. The patient expressed understanding and agreed to proceed.  I also discussed his case with his caregivers who are present and family member.   History of Present Illness:  [name] is a 59 year old Caucasian man with a history of cerebral palsy, bipolar disorder anxiety depression mental retardation who is quadriplegic and has been suffering from pressure ulcers over many years.  More recently he has been dealing with a ulcer on his right lateral foot which is failed to heal despite months if not greater than a year of being present.  He has had MRSA grow from the wound as well as Pseudomonas. He is being followed closely of the wound center by [name] and Dr. Leanord [name]. He was recently also seen by Dr. Samuella [name] with podiatry. There is been concern about this wound failing to heal up and anxiety about osteomyelitis which is certainly understandable. Plain films did not show evidence of osteomyelitis but I also have some anxiety about this. He was treated with various oral antibiotics recently including clindamycin doxycycline. He has not received antibiotics targeting Pseudomonas due to anxiety but QT prolongation in the context of him also being on Risperdal.  I saw the patient in the clinic on 06 September 2019.  Interim he underwent MRI of the foot on 26 April which showed soft tissue ulceration of the lateral midfoot with osteomyelitis of the proximal half of the fifth metatarsal.  Is seen  by wound care and podiatry and underwent ultimately a right fifth ray amputation I was an inpatient at [facility].  Cultures did show Pseudomonas aeruginosa again.  Dr. Ninetta [name] saw the patient in the hospital and felt with the amputation seeming curative in terms of his osteomyelitis the patient could be observed off antibiotics.  He has been ultimately discharged and seen again by podiatry concerns for continuing soft tissue infection.  I started him on ciprofloxacin on June 5 to cover Pseudomonas.  He was taken to the operating room on went excisional debridement of his ulcer by podiatry.  Culture was sent which again grew Pseudomonas aeruginosa.  Patient has remained on the ciprofloxacin since then.  Been seen by podiatry and follow-up were happy with the appearance of his wound.  He has neck seeing Dr. Samuella [name] next week.  Review of systems not obtainable due to the patient's cognitive status  Past Medical History:  Diagnosis Date  . Anemia    microcytic anemia   . Anxiety    takes Ativan daily as needed  . Bipolar disorder (HCC)    takes Depakote daily  . CEREBRAL PALSY 07/24/2006  . Chronic bronchitis (HCC)    "gets it q yr"  . Chronic bronchitis (HCC)   . 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)   . Constipation    takes Miralax daily as needed  . Depression    takes Risperdal nightly  . Environmental allergies    takes Zyrtec daily  . GASTRIC ULCER ACUTE WITH HEMORRHAGE 07/24/2006  . Gastric ulcer with  hemorrhage    hx of   . GERD (gastroesophageal reflux disease)    takes Pravacid daily  . History of blood transfusion    no abnormal reaction noted  . History of hiatal hernia   . History of shingles   . History of stomach ulcers ~ 1996  . Hypercholesterolemia   . Intellectual functioning disability   . Intermittent explosive disorder   . Internal hemorrhoids   . MRSA infection 09/06/2019  . Obsessive compulsive disorder   .  Osteomyelitis (HCC)    right foot   . Pneumonia "quite a few times"   in 2015 and in June 2016  . Pressure ulcer of sacral region    hx of   . Pseudomonas aeruginosa infection 09/06/2019  . Psychotic disorder (HCC)   . Recurrent UTI (urinary tract infection)   . Seborrheic dermatitis   . Sigmoid diverticulosis   . Spastic tetraplegia (HCC)   . URINARY INCONTINENCE 02/09/2010   takes Detrol daily-occasionally incontinence  . Vitamin D deficiency   . Weakness    numbness in extremities    Past Surgical History:  Procedure Laterality Date  . AMPUTATION Right 12/14/2014   Procedure: AMPUTATION DIGIT RIGHT GREAT TOE MTP;  Surgeon: [name] Mustard, MD;  Location: [facility];  Service: Orthopedics;  Laterality: Right;  . AMPUTATION Right 10/08/2019   Procedure: RIGHT FIFTH RAY AMPUTATION, ADJACENT TISSUE TRANSFER;  Surgeon: [name], DPM;  Location: [facility];  Service: Podiatry;  Laterality: Right;  . BIOPSY  08/12/2018   Procedure: BIOPSY;  Surgeon: [name] Bologna, MD;  Location: [facility];  Service: Gastroenterology;;  . COLONOSCOPY WITH PROPOFOL N/A 08/14/2018   Procedure: COLONOSCOPY WITH PROPOFOL;  Surgeon: [name] Bologna, MD;  Location: [facility];  Service: Gastroenterology;  Laterality: N/A;  . ESOPHAGOGASTRODUODENOSCOPY (EGD) WITH PROPOFOL N/A 08/12/2018   Procedure: ESOPHAGOGASTRODUODENOSCOPY (EGD) WITH PROPOFOL;  Surgeon: [name] Bologna, MD;  Location: [facility];  Service: Gastroenterology;  Laterality: N/A;  . GRAFT APPLICATION Right 11/12/2019   Procedure: SKIN GRAFT SUBSTITUTE APPLICATION;  Surgeon: [name], DPM;  Location: [facility];  Service: Podiatry;  Laterality: Right;  . head surgery  as a baby   due to "water head"   . HERNIA REPAIR    . HIATAL HERNIA REPAIR  1982  . LEG SURGERY Bilateral ~ 1967   "legs were scissored; stretched them out"  . WOUND DEBRIDEMENT Right 11/12/2019   Procedure: DEBRIDEMENT WOUND;  Surgeon: [name], DPM;  Location: [facility];   Service: Podiatry;  Laterality: Right;    Family History  Adopted: Yes      Social History   Socioeconomic History  . Marital status: Single    Spouse name: Not on file  . Number of children: Not on file  . Years of education: Not on file  . Highest education level: Not on file  Occupational History  . Not on file  Tobacco Use  . Smoking status: Never Smoker  . Smokeless tobacco: Never Used  Substance and Sexual Activity  . Alcohol use: No  . Drug use: No  . Sexual activity: Never  Other Topics Concern  . Not on file  Social History Narrative  . Not on file   Social Determinants of Health   Financial Resource Strain:   . Difficulty of Paying Living Expenses:   Food Insecurity:   . Worried About Programme researcher, broadcasting/film/video in the Last Year:   . [employer] of Food in the Last Year:  Transportation Needs:   . Freight forwarder (Medical):   Marland Kitchen Lack of Transportation (Non-Medical):   Physical Activity:   . Days of Exercise per Week:   . Minutes of Exercise per Session:   Stress:   . Feeling of Stress :   Social Connections:   . Frequency of Communication with Friends and Family:   . Frequency of Social Gatherings with Friends and Family:   . Attends Religious Services:   . Active Member of Clubs or Organizations:   . Attends Banker Meetings:   Marland Kitchen Marital Status:     Allergies  Allergen Reactions  . Adhesive [Tape] Rash     Current Outpatient Medications:  .  acetaminophen (TYLENOL) 500 MG tablet, Take 1,000 mg by mouth every 6 (six) hours as needed for moderate pain., Disp: , Rfl:  .  alum & mag hydroxide-simeth (MYLANTA) 200-200-20 MG/5ML suspension, Take 30 mLs by mouth every 6 (six) hours as needed for indigestion or heartburn. Use as directed as needed for nausea/indigestion per standing order, Disp: , Rfl:  .  baclofen (LIORESAL) 20 MG tablet, Take 1 tablet (20 mg total) by mouth 3 (three) times daily., Disp: 90 each, Rfl: 1 .   brompheniramine-pseudoephedrine (DIMETAPP) 1-15 MG/5ML ELIX, Take 10 mLs by mouth daily as needed (Cold sympton). , Disp: , Rfl:  .  cetirizine (ZYRTEC) 10 MG tablet, Take 1 tablet (10 mg total) by mouth daily. (Patient taking differently: Take 10 mg by mouth at bedtime. ), Disp: 30 tablet, Rfl: 3 .  Cholecalciferol (VITAMIN D3) 5000 units CAPS, Take 5,000 Units by mouth daily., Disp: , Rfl:  .  ciprofloxacin (CIPRO) 500 MG tablet, Take 1 tablet (500 mg total) by mouth 2 (two) times daily., Disp: 60 tablet, Rfl: 0 .  clotrimazole (LOTRIMIN) 1 % cream, Apply 1 application topically 2 (two) times daily. Apply to face, Disp: , Rfl:  .  divalproex (DEPAKOTE SPRINKLE) 125 MG capsule, Take 500 mg by mouth 2 (two) times daily. , Disp: , Rfl:  .  EAR DROPS EARWAX AID 6.5 % OTIC solution, PLACE 5 DROPS INTO THE LEFT EAR TWICE DAILY (Patient taking differently: Place 5 drops into the left ear 2 (two) times daily. ), Disp: 15 mL, Rfl: 4 .  Elastic Bandages & Supports (JOBST ANTI-EM KNEE HIGH MED) MISC, by Does not apply route. Apply once in the morning and remove at bedtime daily, Disp: , Rfl:  .  Foot Care Products (PREVALON HEEL PROTECTOR) MISC, 1 Device by Does not apply route daily., Disp: 2 each, Rfl: 0 .  guaiFENesin-dextromethorphan (ROBITUSSIN DM) 100-10 MG/5ML syrup, Take 10 mLs by mouth every 4 (four) hours as needed for cough. , Disp: , Rfl:  .  hydrocortisone 1 % ointment, Apply 1 application topically 2 (two) times daily. Apply a thin layer topically to the affected area(s) for Seborrheic dermatitis, Disp: , Rfl:  .  iron polysaccharides (NIFEREX) 150 MG capsule, Take 150 mg by mouth daily at 12 noon. , Disp: , Rfl:  .  ketoconazole (NIZORAL) 2 % shampoo, APPLY TOPICALLY TO AFFECTED AREA(S) TWICE WEEKLY AS DIRECTED (Patient taking differently: Apply 1 application topically 2 (two) times a week. Apply topically affected area(s) as directed), Disp: 120 mL, Rfl: 4 .  LORazepam (ATIVAN) 2 MG tablet, Take  1 mg by mouth every 4 (four) hours as needed for anxiety. , Disp: , Rfl:  .  Neomycin-Bacitracin-Polymyxin (TRIPLE ANTIBIOTIC EX), Apply 1 application topically as needed (cuts/scrapes/scratches)., Disp: , Rfl:  .  omeprazole (PRILOSEC) 40 MG capsule, Take 1 capsule (40 mg total) by mouth 2 (two) times daily., Disp: 60 capsule, Rfl: 0 .  oxyCODONE-acetaminophen (PERCOCET) 5-325 MG tablet, Take 1 tablet by mouth every 4 (four) hours as needed for severe pain., Disp: 20 tablet, Rfl: 0 .  Oyster Shell (OYSTER CALCIUM) 500 MG TABS tablet, Take 1,500 mg of elemental calcium by mouth daily., Disp: , Rfl:  .  polyethylene glycol (MIRALAX) 17 g packet, Take 17 g by mouth daily as needed for moderate constipation. (Patient taking differently: Take 17 g by mouth daily as needed for moderate constipation. Clearlax power), Disp: , Rfl:  .  risperiDONE (RISPERDAL) 2 MG tablet, Take 2 mg by mouth at bedtime., Disp: , Rfl:  .  silver sulfADIAZINE (SILVADENE) 1 % cream, Apply 1 application topically daily., Disp: 50 g, Rfl: 3 .  tamsulosin (FLOMAX) 0.4 MG CAPS capsule, TAKE 1 CAPSULE BY MOUTH EVERY DAY *DO NOT CRUSH* (Patient taking differently: Take 0.4 mg by mouth daily at 6 PM. ), Disp: 30 capsule, Rfl: 2 .  tiZANidine (ZANAFLEX) 2 MG tablet, Take 2 mg by mouth 3 (three) times daily., Disp: , Rfl:  .  tolterodine (DETROL LA) 2 MG 24 hr capsule, Take 1 capsule (2 mg total) by mouth daily. (Patient taking differently: Take 2 mg by mouth daily at 6 PM. ), Disp: 90 capsule, Rfl: 3 .  tretinoin (RETIN-A) 0.025 % gel, Apply 1 application topically at bedtime. Apply topically to face area, Disp: , Rfl:  .  vitamin C (VITAMIN C) 250 MG tablet, Take 1 tablet (250 mg total) by mouth daily., Disp: 30 tablet, Rfl: 0 .  VOLTAREN 1 % GEL, APPLY 4 GRAMS TOPICALLY TO AFFECTED AREA(S) FOUR TIMES DAILY (Patient taking differently: Apply 1 g topically 4 (four) times daily. ), Disp: 100 g, Rfl: 11 .  Zinc Oxide 40 % PSTE, Apply thin  layer topically to buttocks and scrotum BID as needed. (Patient taking differently: Apply 1 application topically 2 (two) times daily as needed (unspecified). Desitin Max strength paste apply thin layer topically to buttocks and scrotum), Disp: 113 g, Rfl: 3 .  divalproex (DEPAKOTE SPRINKLE) 125 MG capsule, Take 500 mg by mouth 2 (two) times daily., Disp: , Rfl:  .  hydrocortisone cream 1 %, Apply 1 application topically at bedtime as needed for itching. (Patient taking differently: Apply 1 application topically at bedtime as needed for itching (Apply topically to face). ), Disp: 28 g, Rfl: 0    Observations/Objective:  Patient appears to be doing relatively well based on caregivers descriptions of his condition.  He was oriented to person place was able to give me his birthdate and address.    Assessment and Plan: Osteomyelitis involving fifth metatarsal status post amputation but having gone further debridements.  He has been on ciprofloxacin and went debridement again on 18 June.  Pseudomonas was then again isolated.  Wound apparently is doing well.  I will have him finish his ciprofloxacin and follow-up with me in roughly a month to 6 weeks after he has completed his antibiotics.  He has close follow-up with Dr. Samuella [name] with podiatry shortly.  Follow Up Instructions:    I discussed the assessment and treatment plan with the patient. The patient was provided an opportunity to ask questions and all were answered. The patient agreed with the plan and demonstrated an understanding of the instructions.   The patient was advised to call back or seek an in-person evaluation if the  symptoms worsen or if the condition fails to improve as anticipated.  I provided 22 minutes of non-face-to-face time during this encounter.   Acey Lavornelius Van Dam, MD

## 2019-12-07 ENCOUNTER — Other Ambulatory Visit: Payer: Self-pay | Admitting: Family Medicine

## 2019-12-09 ENCOUNTER — Telehealth: Payer: Self-pay

## 2019-12-09 NOTE — Telephone Encounter (Signed)
Received call from [facility] with [facility] regarding cipro. Would like to know if patient is supposed to continue taking medication or to stop. Per last note patient is supposed to complete antibiotics and then follow up in one month 6 weeks.  Updated pharmacy with plan. Lorenso Courier, [place]

## 2019-12-10 ENCOUNTER — Ambulatory Visit: Payer: Medicare Other | Admitting: Podiatry

## 2019-12-16 ENCOUNTER — Ambulatory Visit: Payer: Medicare Other | Admitting: Podiatry

## 2019-12-16 ENCOUNTER — Telehealth: Payer: Self-pay | Admitting: *Deleted

## 2019-12-16 ENCOUNTER — Ambulatory Visit (INDEPENDENT_AMBULATORY_CARE_PROVIDER_SITE_OTHER): Payer: Medicare Other | Admitting: Podiatry

## 2019-12-16 ENCOUNTER — Other Ambulatory Visit: Payer: Self-pay

## 2019-12-16 DIAGNOSIS — L97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 layer exposed: Secondary | ICD-10-CM

## 2019-12-16 NOTE — Telephone Encounter (Signed)
Faxed Dr. Kandice Hams 12/16/2019 12:53pm orders to [facility].

## 2019-12-16 NOTE — Telephone Encounter (Signed)
-----   Message from Park Liter, DPM sent at 12/16/2019 12:53 PM EDT ----- Can we update [facility] to silvadene and DSD thrice weekly.

## 2019-12-16 NOTE — Progress Notes (Signed)
  Subjective:  Patient ID: [name], male    DOB: [birth year],  MRN: [id number]  No chief complaint on file.  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 male presents with the above complaint. History confirmed with patient.   Objective:  Physical Exam: no tenderness at the surgical site, local edema noted and calf supple, nontender.  Incision: healing well, wound measuring 3x1, 70% granular  Assessment:   1. Ulcerated, foot, right, with fat layer exposed (HCC)    Plan:  Patient was evaluated and treated and all questions answered.  Post-operative State -Wound cleansed, mechanically debrided with moist gauze -Dressing applied consisting of silvadene sterile gauze mepilex border dressing -[facility] to apply silvadene and DSD three times weekly  -F/u in 1 month  Return in about 4 weeks (around 01/13/2020) for Wound Care, Right.

## 2019-12-21 ENCOUNTER — Other Ambulatory Visit: Payer: Self-pay | Admitting: Family Medicine

## 2019-12-24 ENCOUNTER — Other Ambulatory Visit: Payer: Self-pay | Admitting: Family Medicine

## 2020-01-13 ENCOUNTER — Ambulatory Visit (INDEPENDENT_AMBULATORY_CARE_PROVIDER_SITE_OTHER): Payer: Medicare Other | Admitting: Infectious Disease

## 2020-01-13 ENCOUNTER — Encounter: Payer: Self-pay | Admitting: Infectious Disease

## 2020-01-13 ENCOUNTER — Other Ambulatory Visit: Payer: Self-pay

## 2020-01-13 VITALS — BP 117/78 | HR 62

## 2020-01-13 DIAGNOSIS — A498 Other bacterial infections of unspecified site: Secondary | ICD-10-CM | POA: Diagnosis not present

## 2020-01-13 DIAGNOSIS — M86171 Other acute osteomyelitis, right ankle and foot: Secondary | ICD-10-CM

## 2020-01-13 DIAGNOSIS — G809 Cerebral palsy, unspecified: Secondary | ICD-10-CM | POA: Diagnosis not present

## 2020-01-13 DIAGNOSIS — G8250 Quadriplegia, unspecified: Secondary | ICD-10-CM | POA: Diagnosis not present

## 2020-01-13 DIAGNOSIS — A4902 Methicillin resistant Staphylococcus aureus infection, unspecified site: Secondary | ICD-10-CM | POA: Diagnosis not present

## 2020-01-13 NOTE — Progress Notes (Signed)
Subjective:  followup for osteomyelitis, foot pressure ulcer  Patient ID: [name], male    DOB: 1960/11/15, 59 y.o.   MRN: [id number]  HPI   [name] is a 59 year old Caucasian man with a history of cerebral palsy, bipolar disorder anxiety depression mental retardation who is quadriplegic and has been suffering from pressure ulcers over many years.  More recently he has been dealing with a ulcer on his right lateral foot which is failed to heal despite months if not greater than a year of being present.  He has had MRSA grow from the wound as well as Pseudomonas. He is being followed closely of the wound center by Jeri Cos and Dr. Dellia Nims. He was recently also seen by Dr. March Rummage with podiatry. There is been concern about this wound failing to heal up and anxiety about osteomyelitis which is certainly understandable. Plain films did not show evidence of osteomyelitis but I also have some anxiety about this. He was treated with various oral antibiotics recently including clindamycin doxycycline. He has not received antibiotics targeting Pseudomonas due to anxiety but QT prolongation in the context of him also being on Risperdal.  I saw the patient in the clinic on 06 September 2019.  Interim he underwent MRI of the foot on 26 April which showed soft tissue ulceration of the lateral midfoot with osteomyelitis of the proximal half of the fifth metatarsal. Is seen by wound care and podiatry and underwent ultimately a right fifth ray amputation I was an inpatient at [facility]. Cultures did show Pseudomonas aeruginosa again.  Dr. Johnnye Sima saw the patient in the hospital and felt with the amputation seeming curative in terms of his osteomyelitis the patient could be observed off antibiotics.  He was scharged and seen again by podiatry concerns for continuing soft tissue infection.  I started him on ciprofloxacin on June 5 to cover Pseudomonas.  He was taken to the operating  room on went excisional debridement of his ulcer by podiatry.  Culture was sent which again grew Pseudomonas aeruginosa. Patient completed a course of ciprofloxacin and is being followed closely by Dr. March Rummage.   Past Medical History:  Diagnosis Date   Anemia    microcytic anemia    Anxiety    takes Ativan daily as needed   Bipolar disorder ([place])    takes Depakote daily   CEREBRAL PALSY 07/24/2006   Chronic bronchitis ([place])    "gets it q yr"   Chronic bronchitis ([place])    Chronic diabetic ulcer of foot determined by examination [facility])    Constipation    takes Miralax daily as needed   Depression    takes Risperdal nightly   Environmental allergies    takes Zyrtec daily   GASTRIC ULCER ACUTE WITH HEMORRHAGE 07/24/2006   Gastric ulcer with hemorrhage    hx of    GERD (gastroesophageal reflux disease)    takes Pravacid daily   History of blood transfusion    no abnormal reaction noted   History of hiatal hernia    History of shingles    History of stomach ulcers ~ 1996   Hypercholesterolemia    Intellectual functioning disability    Intermittent explosive disorder    Internal hemorrhoids    MRSA infection 09/06/2019   Obsessive compulsive disorder    Osteomyelitis ([place])    right foot    Pneumonia "quite a few times"   in 2015 and in June 2016   Pressure ulcer of sacral region  hx of    Pseudomonas aeruginosa infection 09/06/2019   Psychotic disorder ([place])    Recurrent UTI (urinary tract infection)    Seborrheic dermatitis    Sigmoid diverticulosis    Spastic tetraplegia ([place])    URINARY INCONTINENCE 02/09/2010   takes Detrol daily-occasionally incontinence   Vitamin D deficiency    Weakness    numbness in extremities    Past Surgical History:  Procedure Laterality Date   AMPUTATION Right 12/14/2014   Procedure: AMPUTATION DIGIT RIGHT GREAT TOE MTP;  Surgeon: Newt Minion, MD;  Location: [facility];  Service: Orthopedics;  Laterality:  Right;   AMPUTATION Right 10/08/2019   Procedure: RIGHT FIFTH RAY AMPUTATION, ADJACENT TISSUE TRANSFER;  Surgeon: Evelina Bucy, DPM;  Location: [facility];  Service: Podiatry;  Laterality: Right;   BIOPSY  08/12/2018   Procedure: BIOPSY;  Surgeon: Jackquline Denmark, MD;  Location: [facility];  Service: Gastroenterology;;   COLONOSCOPY WITH PROPOFOL N/A 08/14/2018   Procedure: COLONOSCOPY WITH PROPOFOL;  Surgeon: Jackquline Denmark, MD;  [facility];  Service: Gastroenterology;  Laterality: N/A;   ESOPHAGOGASTRODUODENOSCOPY (EGD) WITH PROPOFOL N/A 08/12/2018   Procedure: ESOPHAGOGASTRODUODENOSCOPY (EGD) WITH PROPOFOL;  Surgeon: Jackquline Denmark, MD;  [facility];  Service: Gastroenterology;  Laterality: N/A;   GRAFT APPLICATION Right 9/48/0165   Procedure: SKIN GRAFT SUBSTITUTE APPLICATION;  Surgeon: Evelina Bucy, DPM;  Location: [facility];  Service: Podiatry;  Laterality: Right;   head surgery  as a baby   due to "water head"    [place] Bilateral ~ 1967   "legs were scissored; stretched them out"   WOUND DEBRIDEMENT Right 11/12/2019   Procedure: DEBRIDEMENT WOUND;  Surgeon: Evelina Bucy, DPM;  Location: [facility];  Service: Podiatry;  Laterality: Right;    Family History  Adopted: Yes      Social History   Socioeconomic History   Marital status: Single    Spouse name: Not on file   Number of children: Not on file   Years of education: Not on file   Highest education level: Not on file  Occupational History   Not on file  Tobacco Use   Smoking status: Never Smoker   Smokeless tobacco: Never Used  Substance and Sexual Activity   Alcohol use: No   Drug use: No   Sexual activity: Never  Other Topics Concern   Not on file  Social History Narrative   Not on file   Social Determinants of Health   Financial Resource Strain:    Difficulty of Paying Living Expenses: Not on file  Food  Insecurity:    Worried About [place] in the Last Year: Not on file   Ran Out of Food in the Last Year: Not on file  Transportation Needs:    Lack of Transportation (Medical): Not on file   Lack of Transportation (Non-Medical): Not on file  Physical Activity:    Days of Exercise per Week: Not on file   Minutes of Exercise per Session: Not on file  Stress:    Feeling of Stress : Not on file  Social Connections:    Frequency of Communication with Friends and Family: Not on file   Frequency of Social Gatherings with Friends and Family: Not on file   Attends Religious Services: Not on file   Active Member of Clubs or Organizations: Not on file   Attends Archivist Meetings:  Not on file   Marital Status: Not on file    Allergies  Allergen Reactions   Adhesive [Tape] Rash     Current Outpatient Medications:    acetaminophen (TYLENOL) 500 MG tablet, Take 1,000 mg by mouth every 6 (six) hours as needed for moderate pain., Disp: , Rfl:    alum & mag hydroxide-simeth (MYLANTA) 419-379-02 MG/5ML suspension, Take 30 mLs by mouth every 6 (six) hours as needed for indigestion or heartburn. Use as directed as needed for nausea/indigestion per standing order, Disp: , Rfl:    baclofen (LIORESAL) 20 MG tablet, Take 1 tablet (20 mg total) by mouth 3 (three) times daily., Disp: 90 each, Rfl: 1   brompheniramine-pseudoephedrine (DIMETAPP) 1-15 MG/5ML ELIX, Take 10 mLs by mouth daily as needed (Cold sympton). , Disp: , Rfl:    cetirizine (ZYRTEC) 10 MG tablet, Take 1 tablet (10 mg total) by mouth daily. (Patient taking differently: Take 10 mg by mouth at bedtime. ), Disp: 30 tablet, Rfl: 3   Cholecalciferol (VITAMIN D3) 5000 units CAPS, Take 5,000 Units by mouth daily., Disp: , Rfl:    ciprofloxacin (CIPRO) 500 MG tablet, Take 1 tablet (500 mg total) by mouth 2 (two) times daily., Disp: 60 tablet, Rfl: 0   clotrimazole (LOTRIMIN) 1 % cream, Apply 1 application  topically 2 (two) times daily. Apply to face, Disp: , Rfl:    divalproex (DEPAKOTE SPRINKLE) 125 MG capsule, Take 500 mg by mouth 2 (two) times daily. , Disp: , Rfl:    divalproex (DEPAKOTE SPRINKLE) 125 MG capsule, Take 500 mg by mouth 2 (two) times daily., Disp: , Rfl:    EAR DROPS EARWAX AID 6.5 % OTIC solution, PLACE 5 DROPS INTO THE LEFT EAR TWICE DAILY (Patient taking differently: Place 5 drops into the left ear 2 (two) times daily. ), Disp: 15 mL, Rfl: 4   Elastic Bandages & Supports (JOBST ANTI-EM KNEE HIGH MED) MISC, by Does not apply route. Apply once in the morning and remove at bedtime daily, Disp: , Rfl:    Foot Care Products (PREVALON HEEL PROTECTOR) MISC, 1 Device by Does not apply route daily., Disp: 2 each, Rfl: 0   guaiFENesin-dextromethorphan (ROBITUSSIN DM) 100-10 MG/5ML syrup, Take 10 mLs by mouth every 4 (four) hours as needed for cough. , Disp: , Rfl:    hydrocortisone 1 % ointment, Apply 1 application topically 2 (two) times daily. Apply a thin layer topically to the affected area(s) for Seborrheic dermatitis, Disp: , Rfl:    hydrocortisone cream 1 %, Apply 1 application topically at bedtime as needed for itching. (Patient taking differently: Apply 1 application topically at bedtime as needed for itching (Apply topically to face). ), Disp: 28 g, Rfl: 0   iron polysaccharides (NIFEREX) 150 MG capsule, Take 150 mg by mouth daily at 12 noon. , Disp: , Rfl:    ketoconazole (NIZORAL) 2 % shampoo, APPLY TOPICALLY TO AFFECTED AREA(S) TWICE WEEKLY AS DIRECTED, Disp: 120 mL, Rfl: 4   LORazepam (ATIVAN) 2 MG tablet, Take 1 mg by mouth every 4 (four) hours as needed for anxiety. , Disp: , Rfl:    Neomycin-Bacitracin-Polymyxin (TRIPLE ANTIBIOTIC EX), Apply 1 application topically as needed (cuts/scrapes/scratches)., Disp: , Rfl:    omeprazole (PRILOSEC) 40 MG capsule, Take 1 capsule (40 mg total) by mouth 2 (two) times daily., Disp: 60 capsule, Rfl: 0   oxyCODONE-acetaminophen  (PERCOCET) 5-325 MG tablet, Take 1 tablet by mouth every 4 (four) hours as needed for severe pain., Disp: 20  tablet, Rfl: 0   Oyster Shell (OYSTER CALCIUM) 500 MG TABS tablet, Take 1,500 mg of elemental calcium by mouth daily., Disp: , Rfl:    polyethylene glycol (MIRALAX) 17 g packet, Take 17 g by mouth daily as needed for moderate constipation. (Patient taking differently: Take 17 g by mouth daily as needed for moderate constipation. Clearlax power), Disp: , Rfl:    risperiDONE (RISPERDAL) 2 MG tablet, Take 2 mg by mouth at bedtime., Disp: , Rfl:    silver sulfADIAZINE (SILVADENE) 1 % cream, Apply 1 application topically daily., Disp: 50 g, Rfl: 3   tamsulosin (FLOMAX) 0.4 MG CAPS capsule, TAKE 1 CAPSULE BY MOUTH EVERY DAY *DO NOT CRUSH* (Patient taking differently: Take 0.4 mg by mouth daily at 6 PM. ), Disp: 30 capsule, Rfl: 2   tiZANidine (ZANAFLEX) 2 MG tablet, Take 2 mg by mouth 3 (three) times daily., Disp: , Rfl:    tolterodine (DETROL LA) 2 MG 24 hr capsule, Take 1 capsule (2 mg total) by mouth daily. (Patient taking differently: Take 2 mg by mouth daily at 6 PM. ), Disp: 90 capsule, Rfl: 3   tretinoin (RETIN-A) 0.025 % gel, Apply 1 application topically at bedtime. Apply topically to face area, Disp: , Rfl:    vitamin C (VITAMIN C) 250 MG tablet, Take 1 tablet (250 mg total) by mouth daily., Disp: 30 tablet, Rfl: 0   VOLTAREN 1 % GEL, APPLY 4 GRAMS TOPICALLY TO AFFECTED AREA(S) FOUR TIMES DAILY (Patient taking differently: Apply 1 g topically 4 (four) times daily. ), Disp: 100 g, Rfl: 11   Zinc Oxide 40 % PSTE, Apply thin layer topically to buttocks and scrotum BID as needed. (Patient taking differently: Apply 1 application topically 2 (two) times daily as needed (unspecified). Desitin Max strength paste apply thin layer topically to buttocks and scrotum), Disp: 113 g, Rfl: 3   Review of Systems  Unable to perform ROS: Dementia       Objective:   Physical  Exam Constitutional:      Appearance: He is well-developed.  HENT:     Head: Atraumatic.  Eyes:     Conjunctiva/sclera: Conjunctivae normal.  Cardiovascular:     Rate and Rhythm: Normal rate and regular rhythm.  Pulmonary:     Effort: Pulmonary effort is normal. No respiratory distress.     Breath sounds: No wheezing.  Abdominal:     General: There is no distension.     Palpations: Abdomen is soft.  Musculoskeletal:        General: No tenderness. Normal range of motion.     Cervical back: Normal range of motion and neck supple.  Skin:    General: Skin is warm and dry.     Coloration: Skin is not pale.     Findings: No erythema or rash.  Neurological:     Mental Status: He is alert.  Psychiatric:        Behavior: Behavior is cooperative.    quadriplegic  Right foot 01/13/2020:           Assessment & Plan:  Osteomyelitis involving fifth metatarsal status post amputation but having gone further debridements sp course of cipro after pseudomonas isolated on culture and debridement  --check labs ESR, CRP  --monitor off abx --seeing Podiatry next month --rtc in 2 months to see me

## 2020-01-18 ENCOUNTER — Ambulatory Visit (INDEPENDENT_AMBULATORY_CARE_PROVIDER_SITE_OTHER): Payer: Medicare Other | Admitting: Podiatry

## 2020-01-18 ENCOUNTER — Other Ambulatory Visit: Payer: Self-pay

## 2020-01-18 DIAGNOSIS — L97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 layer exposed: Secondary | ICD-10-CM | POA: Diagnosis not present

## 2020-01-18 MED ORDER — SANTYL 250 UNIT/GM EX OINT: TOPICAL_OINTMENT | CUTANEOUS | 0 refills | Status: DC

## 2020-01-18 NOTE — Progress Notes (Signed)
  Subjective:  Patient ID: [name], male    DOB: July 08, 1960,  MRN: [id number]  Chief Complaint  Patient presents with  . Wound Check    Pt states healing well without any pain or concerns. Denies fever/nausea/vomiting/chills.   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 male presents with the above complaint. History confirmed with patient.   Objective:  Physical Exam: no tenderness at the surgical site, local edema noted and calf supple, nontender.  Incision: healing well, wound measuring 3x1.5. Additional pressure injuries anterior ankle and lateral foot anterior to the original wound.  Assessment:   1. Ulcerated, foot, right, with fat layer exposed (HCC)    Plan:  Patient was evaluated and treated and all questions answered.  Post-operative State -Wound cleansed, selectively debrided with curette -Wound healing slowly. Switch to santyl. Ordered today -Discussed importance of padding off areas to prevent worsening and new injury. 2 new pressure injuries noted today.  No follow-ups on file.

## 2020-01-20 ENCOUNTER — Telehealth: Payer: Self-pay

## 2020-01-20 NOTE — Telephone Encounter (Signed)
Pts caretaker called about an order that was send to the wrong pharmacy. The correct pharmacy is [facility] in [place], [place]. Please advise.

## 2020-01-23 NOTE — Telephone Encounter (Signed)
The Rx is for Santyl. It won't be filled by the local pharmacy, but will be delivered to patient.

## 2020-01-26 ENCOUNTER — Other Ambulatory Visit: Payer: Self-pay | Admitting: Podiatry

## 2020-01-26 ENCOUNTER — Telehealth (INDEPENDENT_AMBULATORY_CARE_PROVIDER_SITE_OTHER): Payer: Medicare Other | Admitting: Podiatry

## 2020-01-26 ENCOUNTER — Telehealth: Payer: Self-pay | Admitting: Podiatry

## 2020-01-26 DIAGNOSIS — L97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 layer exposed: Secondary | ICD-10-CM

## 2020-01-26 MED ORDER — SANTYL 250 UNIT/GM EX OINT: TOPICAL_OINTMENT | CUTANEOUS | 0 refills | Status: DC

## 2020-01-26 NOTE — Progress Notes (Signed)
Rx sent to [facility]

## 2020-01-26 NOTE — Telephone Encounter (Signed)
Nurse Matthias Hughs from [facility] states order for Santyl needs to be sent to [facility], [address], [place], [place] [address]. Fax number [phone].

## 2020-01-26 NOTE — Telephone Encounter (Signed)
Rx sent 

## 2020-01-27 ENCOUNTER — Telehealth (INDEPENDENT_AMBULATORY_CARE_PROVIDER_SITE_OTHER): Payer: Medicare Other | Admitting: Podiatry

## 2020-01-27 NOTE — Telephone Encounter (Signed)
Noted  

## 2020-01-27 NOTE — Telephone Encounter (Signed)
Nurse Karie Soda with Home Health has received referral on [facility] and they will go out today and perform wound care.

## 2020-01-27 NOTE — Telephone Encounter (Signed)
Encounter in error

## 2020-01-28 NOTE — Progress Notes (Signed)
Ive faxed over all the information.  

## 2020-02-07 ENCOUNTER — Telehealth: Payer: Self-pay | Admitting: Podiatry

## 2020-02-07 DIAGNOSIS — L97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 layer exposed: Secondary | ICD-10-CM

## 2020-02-07 MED ORDER — SANTYL 250 UNIT/GM EX OINT: TOPICAL_OINTMENT | CUTANEOUS | 0 refills | Status: DC

## 2020-02-07 NOTE — Telephone Encounter (Signed)
Spoke with [name] and she needed new Santyl order with sig of to be applied only by home health nurse, I sent new Rx to his pharmacy as sig adjustment

## 2020-02-08 NOTE — Telephone Encounter (Signed)
Perfect thanks

## 2020-02-10 ENCOUNTER — Other Ambulatory Visit: Payer: Self-pay | Admitting: Podiatry

## 2020-02-10 DIAGNOSIS — L97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 layer exposed: Secondary | ICD-10-CM

## 2020-02-18 ENCOUNTER — Telehealth: Payer: Self-pay | Admitting: Podiatry

## 2020-02-18 NOTE — Telephone Encounter (Signed)
Called and spoke to [employer] from [facility]. States patient has a new dark area in front of the wound we have been treating. Denies redness or drainage. Advised to continue local care until patient's apt on Tuesday. Should the wound worsen when she sees him Monday we may have to see him that day.

## 2020-02-22 ENCOUNTER — Ambulatory Visit (INDEPENDENT_AMBULATORY_CARE_PROVIDER_SITE_OTHER): Payer: Medicare Other | Admitting: Podiatry

## 2020-02-22 ENCOUNTER — Other Ambulatory Visit: Payer: Self-pay

## 2020-02-22 DIAGNOSIS — L97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 layer exposed: Secondary | ICD-10-CM | POA: Diagnosis not present

## 2020-02-22 DIAGNOSIS — G808 Other cerebral palsy: Secondary | ICD-10-CM | POA: Diagnosis not present

## 2020-02-22 NOTE — Progress Notes (Signed)
  Subjective:  Patient ID: [name], male    DOB: 1960-08-14,  MRN: [id number]  No chief complaint on file.  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 male presents with the above complaint. History confirmed with patient.  Presents with caregiver today.  Thinks that the original wound is healing okay but he is having worsening pressure ulcerations  Objective:  Physical Exam: no tenderness at the surgical site, local edema noted and calf supple, nontender.  Incision: healing well, wound measuring 3x1.5. Additional pressure injuries medial ankle and lateral foot anterior to the original wound. Only with mild redness.  Assessment:   1. Ulcerated, foot, right, with fat layer exposed (HCC)   2. Cerebral palsy, quadriplegic (HCC)    Plan:  Patient was evaluated and treated and all questions answered.  Post-operative State -Wound cleansed, no indication for debridement today.  He continues to have pressure ulcerations.  At this point I think he would benefit from return to wound care.  His issues are no longer surgical but he does need continued wound care.  In the meantime continue Santyl wet-to-dry dressings. -Referral placed to wound care  No follow-ups on file.

## 2020-02-29 ENCOUNTER — Telehealth: Payer: Self-pay | Admitting: Podiatry

## 2020-02-29 NOTE — Telephone Encounter (Signed)
Crystal from [facility] called regarding orders from 9/28 and 9/23. Needs orders signed and faxed back. Her call back number is [phone].

## 2020-02-29 NOTE — Telephone Encounter (Signed)
I may have already signed these and they are pended to be returned otherwise I'll check the scan bin. Can you call and let her know?

## 2020-03-01 ENCOUNTER — Encounter: Payer: Self-pay | Admitting: Podiatry

## 2020-03-01 ENCOUNTER — Telehealth: Payer: Self-pay

## 2020-03-02 ENCOUNTER — Telehealth: Payer: Self-pay | Admitting: Podiatrist

## 2020-03-02 NOTE — Telephone Encounter (Signed)
April from [facility] called and states there is a new  wound on the left medial ankle that is open and red- (stage 3) it is red and the foot is swollen.  She is requesting wound care orders (orders faxed yesterday were re-faxed and I verbally read them to her- silver sulfa to open wounds)  April also thinks he needs an appointment to be seen regarding this new wound-   I'll ask Marchelle Folks to make an appointment for him to come in.  (Amanda,would you mind calling the patient and having him in?)  Thanks!

## 2020-03-02 NOTE — Telephone Encounter (Signed)
Noted thanks °

## 2020-03-07 ENCOUNTER — Ambulatory Visit (INDEPENDENT_AMBULATORY_CARE_PROVIDER_SITE_OTHER): Payer: Medicare Other | Admitting: Podiatry

## 2020-03-07 ENCOUNTER — Other Ambulatory Visit: Payer: Self-pay

## 2020-03-07 DIAGNOSIS — L97329 Non-pressure chronic ulcer of left ankle with unspecified severity: Secondary | ICD-10-CM | POA: Diagnosis not present

## 2020-03-07 DIAGNOSIS — I83023 Varicose veins of left lower extremity with ulcer of ankle: Secondary | ICD-10-CM

## 2020-03-07 DIAGNOSIS — L97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 layer exposed: Secondary | ICD-10-CM

## 2020-03-23 ENCOUNTER — Other Ambulatory Visit: Payer: Self-pay

## 2020-03-23 ENCOUNTER — Encounter (HOSPITAL_BASED_OUTPATIENT_CLINIC_OR_DEPARTMENT_OTHER): Payer: Medicare Other | Attending: Internal Medicine | Admitting: Internal Medicine

## 2020-03-23 DIAGNOSIS — R532 Functional quadriplegia: Secondary | ICD-10-CM | POA: Diagnosis not present

## 2020-03-23 DIAGNOSIS — L97522 Non-pressure chronic ulcer of other part of left foot with fat layer exposed: Secondary | ICD-10-CM | POA: Insufficient documentation

## 2020-03-23 DIAGNOSIS — L97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 layer exposed: Secondary | ICD-10-CM | POA: Diagnosis not present

## 2020-03-23 DIAGNOSIS — I872 Venous insufficiency (chronic) (peripheral): Secondary | ICD-10-CM | POA: Insufficient documentation

## 2020-03-23 DIAGNOSIS — L97322 Non-pressure chronic ulcer of left 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: Secondary | ICD-10-CM | POA: Diagnosis not present

## 2020-03-23 DIAGNOSIS — L97312 Non-pressure chronic ulcer of right 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: Secondary | ICD-10-CM | POA: Diagnosis not present

## 2020-03-23 DIAGNOSIS — T8131XD Disruption of external operation (surgical) wound, not elsewhere classified, subsequent encounter: Secondary | ICD-10-CM | POA: Insufficient documentation

## 2020-03-23 DIAGNOSIS — Z8616 Personal history of COVID-19: Secondary | ICD-10-CM | POA: Insufficient documentation

## 2020-03-23 DIAGNOSIS — I890 Lymphedema, not elsewhere classified: Secondary | ICD-10-CM | POA: Diagnosis not present

## 2020-03-23 DIAGNOSIS — D649 Anemia, unspecified: Secondary | ICD-10-CM | POA: Insufficient documentation

## 2020-03-23 DIAGNOSIS — X58XXXA Exposure to other specified factors, initial encounter: Secondary | ICD-10-CM | POA: Insufficient documentation

## 2020-03-23 DIAGNOSIS — G809 Cerebral palsy, unspecified: Secondary | ICD-10-CM | POA: Diagnosis not present

## 2020-03-23 DIAGNOSIS — Z86718 Personal history of other venous thrombosis and embolism: Secondary | ICD-10-CM | POA: Insufficient documentation

## 2020-03-23 NOTE — Progress Notes (Signed)
[name], [name] ([id number]) Visit Report for 03/23/2020 Chief Complaint Document Details Patient Name: Date of Service: [name], [name] 03/23/2020 10:30 A M Medical Record Number: [id number] Patient Account Number: [id number] Date of Birth/Sex: Treating RN: [birth year] (59 y.o. [name] Primary Care Provider: [name] Other Clinician: Referring Provider: Treating Provider/Extender: Reggy Eye in Treatment: 0 Information Obtained from: Patient Chief Complaint Patient is here for multiple wounds on his bilateral feet and left ankle. 03/23/2020; patient is here for review of multiple wounds on his bilateral feet and ankles Electronic Signature(s) Signed: 03/23/2020 5:01:15 PM By: Baltazar Najjar MD Entered By: Baltazar Najjar on 03/23/2020 12:20:51 -------------------------------------------------------------------------------- Debridement Details Patient Name: Date of Service: [name]. 03/23/2020 10:30 A M Medical Record Number: [id number] Patient Account Number: [id number] Date of Birth/Sex: Treating RN: [birth year] (59 y.o. [name] Primary Care Provider: [name] Other Clinician: Referring Provider: Treating Provider/Extender: Reggy Eye in Treatment: 0 Debridement Performed for Assessment: Wound #43 Right,Distal,Anterior Lower Leg Performed By: Physician Maxwell Caul., MD Debridement Type: Debridement Severity of Tissue Pre Debridement: Fat layer exposed Level of Consciousness (Pre-procedure): Awake and Alert Pre-procedure Verification/Time Out Yes - 12:00 Taken: Start Time: 12:01 Pain Control: Lidocaine 4% T opical Solution T Area Debrided (L x W): otal 2 (cm) x 0.5 (cm) = 1 (cm) Tissue and other material debrided: Viable, Non-Viable, Slough, Subcutaneous, Skin: Dermis , Skin: Epidermis, Fibrin/Exudate, Slough Level: Skin/Subcutaneous Tissue Debridement Description:  Excisional Instrument: Curette Bleeding: Moderate Hemostasis Achieved: Pressure End Time: 12:06 Procedural Pain: 0 Post Procedural Pain: 0 Response to Treatment: Procedure was tolerated well Level of Consciousness (Post- Awake and Alert procedure): Post Debridement Measurements of Total Wound Length: (cm) 3.5 Stage: Category/Stage III Width: (cm) 0.5 Depth: (cm) 0.2 Volume: (cm) 0.275 Character of Wound/Ulcer Post Debridement: Requires Further Debridement Severity of Tissue Post Debridement: Fat layer exposed Post Procedure Diagnosis Same as Pre-procedure Electronic Signature(s) Signed: 03/23/2020 5:01:15 PM By: Baltazar Najjar MD Signed: 03/23/2020 5:24:43 PM By: Shawn Stall Entered By: Baltazar Najjar on 03/23/2020 12:19:39 -------------------------------------------------------------------------------- Debridement Details Patient Name: Date of Service: [name]. 03/23/2020 10:30 A M Medical Record Number: [id number] Patient Account Number: [id number] Date of Birth/Sex: Treating RN: 14-Mar-1961 (59 y.o. [name] Primary Care Provider: [name] Other Clinician: Referring Provider: Treating Provider/Extender: Reggy Eye in Treatment: 0 Debridement Performed for Assessment: Wound #44 Right,Proximal,Lateral Foot Performed By: Physician Maxwell Caul., MD Debridement Type: Debridement Level of Consciousness (Pre-procedure): Awake and Alert Pre-procedure Verification/Time Out Yes - 12:00 Taken: Start Time: 12:01 Pain Control: Lidocaine 4% T opical Solution T Area Debrided (L x W): otal 3.1 (cm) x 3.1 (cm) = 9.61 (cm) Tissue and other material debrided: Viable, Non-Viable, Slough, Subcutaneous, Skin: Dermis , Skin: Epidermis, Fibrin/Exudate, Slough Level: Skin/Subcutaneous Tissue Debridement Description: Excisional Instrument: Curette Bleeding: Moderate Hemostasis Achieved: Pressure End Time: 12:06 Procedural  Pain: 0 Post Procedural Pain: 0 Response to Treatment: Procedure was tolerated well Level of Consciousness (Post- Awake and Alert procedure): Post Debridement Measurements of Total Wound Length: (cm) 3.1 Stage: Unstageable/Unclassified Width: (cm) 3.1 Depth: (cm) 0.3 Volume: (cm) 2.264 Character of Wound/Ulcer Post Debridement: Requires Further Debridement Post Procedure Diagnosis Same as Pre-procedure Electronic Signature(s) Signed: 03/23/2020 5:01:15 PM By: Baltazar Najjar MD Signed: 03/23/2020 5:24:43 PM By: Shawn Stall Entered By: Baltazar Najjar on 03/23/2020 12:19:51 -------------------------------------------------------------------------------- Debridement Details Patient Name: Date of Service: [name]. 03/23/2020 10:30 A M Medical Record Number: [id number] Patient Account Number: [id number]  Date of Birth/Sex: Treating RN: [birth year] (59 y.o. [name] Primary Care Provider: [name] Other Clinician: Referring Provider: Treating Provider/Extender: Reggy Eye in Treatment: 0 Debridement Performed for Assessment: Wound #45 Right,Distal,Lateral Foot Performed By: Physician Maxwell Caul., MD Debridement Type: Debridement Level of Consciousness (Pre-procedure): Awake and Alert Pre-procedure Verification/Time Out Yes - 12:00 Taken: Start Time: 12:01 Pain Control: Lidocaine 4% T opical Solution T Area Debrided (L x W): otal 2 (cm) x 3.4 (cm) = 6.8 (cm) Tissue and other material debrided: Viable, Non-Viable, Slough, Subcutaneous, Skin: Dermis , Skin: Epidermis, Fibrin/Exudate, Slough Level: Skin/Subcutaneous Tissue Debridement Description: Excisional Instrument: Curette Bleeding: Moderate Hemostasis Achieved: Pressure End Time: 12:06 Procedural Pain: 0 Post Procedural Pain: 0 Response to Treatment: Procedure was tolerated well Level of Consciousness (Post- Awake and Alert procedure): Post Debridement  Measurements of Total Wound Length: (cm) 2 Stage: Unstageable/Unclassified Width: (cm) 3.4 Depth: (cm) 0.1 Volume: (cm) 0.534 Character of Wound/Ulcer Post Debridement: Requires Further Debridement Post Procedure Diagnosis Same as Pre-procedure Electronic Signature(s) Signed: 03/23/2020 5:01:15 PM By: Baltazar Najjar MD Signed: 03/23/2020 5:24:43 PM By: Shawn Stall Entered By: Baltazar Najjar on 03/23/2020 12:20:02 -------------------------------------------------------------------------------- HPI Details Patient Name: Date of Service: [name]. 03/23/2020 10:30 A M Medical Record Number: [id number] Patient Account Number: [id number] Date of Birth/Sex: Treating RN: 06-04-1960 (59 y.o. [name] Primary Care Provider: [name] Other Clinician: Referring Provider: Treating Provider/Extender: Reggy Eye in Treatment: 0 History of Present Illness HPI Description: 11/23/15; this is a patient I remember from a previous stay in this clinic in 2012. The patient says this was for a wound in this same area as currently although I have no information to verify that. Most of the information is provided by his caregiver from the group home where he resides. Apparently he has had a wound in this area for 8 months. He also has erythema around this area and has had multiple rounds of antibiotics apparently with some improvement but the erythema is persists. He apparently had a fracture in the ankle 2 months ago and was seen by Dr. Lajoyce Corners . He apparently had a splint applied and more recently a heel offloading boot. He had home health coming out to a week ago not clear what they are dressing this with but they apparently discharged him perhaps because of one of Dr. 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 He has a Nurse, adult for transferring at home he is nonambulatory and does not wear footwear. He is not a  diabetic. Looking through [facility] he appears to have fairly active primary care. The area on the ankle is variably labeled as a pressure area and/or chronic venous insufficiency. He had a recent venous ultrasound on 5/25 that did not show a DVT in the left leg however this was not a reflux study. There was no superficial DVT either. Arterial studies on 10/15/14 showed a left ABI of 1.16 Center right of 1.12 waveforms were triphasic he does not have significant arterial disease. He has had recent x-rays of the left foot and ankle. The left foot did not show any abnormality. Left ankle x-ray showed a spiral fracture of the left tibial diaphysis without evidence of osteomyelitis. 11/30/15 culture I did last week showed pseudomonas I changed him to oral ciprofloxacin. Erythema is a lot better. 12/07/15 area around the wound shows considerable improvement of the periwound erythema. 12/14/15; the patient has a improvement of the  periwound erythema which in retrospect I think with cellulitis successfully treated. We have been using Iodoflex started last week 12/25/15 wound on the left medial malleolus and dorsal left foot. Theraskin #1 applied READMISSION 02/27/16; the patient was admitted to hospital from 8/14 through 01/18/16 initially felt to have cellulitis of his left lower leg however he was noted to have significant persistent pain and swelling. A duplex ultrasound was ordered that was negative. Obie DredgeLangley an x-ray was done that showed a spiral fracture of the left leg. With posterior displacement and angulation at the mid left tibia. Orthopedics saw the patient and splinted the leg. He was then [facility] to [facility] and only return to his group home last week. He has wounds on the left medial malleolus, left dorsal foot and a worrisome area on the plantar left heel which is not really open but may represent a hematoma or a DTI 03/06/16; areas of left anterior and medial ankle both look  improved. There are 3 small wounds here. He has a area over the dorsal left first metatarsal head which is not open. In meantime his left heel has opened 03/14/16 the medial left ankle wounds looks stable to improved. The area on the posterior left heel has opened up and had a very adherent eschar and slept over this 03/21/16 patient has 3 wounds on his left medial ankle is stable area on his left dorsal first metatarsal head appears to be improved. Continued problems with adherent eschar over the left heel 03/28/16; patient has 3 wounds on his left medial ankle and lower leg all of these appear to be stable. The problem continues to be the left heel and the adherent eschar. This may have been a pressure ulcer from her brace at one point. He has a English as a second language teacherbunny boot we have been using [employer] and all wound areas. Dressings are being changed by home health 04/04/16; the patient's areas on the medial aspect of the left ankle looks superficial and appear to be gradually closing. The area on the plantar aspects/Achilles aspect of his left heel as surface slough and nonviable subcutaneous tissue requiring debridement. 04/11/16; the areas on the patient's medial left ankle appear to be superficial and appear to be closing. The area on the plantar heel on the left has surface slough and nonviable subcutaneous tissue requiring debridement. I don't think this is too much different from last week there is also undermining 04/25/16; patient is followed in [facility] group [facility]. The area on the plantar heel is smaller but with still with some depth. We're using Iodoflex to this. The areas on the left ankle looks healthy but dry and somewhat larger. Cause of this is not really clear using Prisma 05/02/16; areas x3 all look improved expecially the areas on the medial ankle. Heel wound is smaller but still deep 05/09/16; all areas 3 look improved on the medial ankle. Heel wound is also  contracting. 12/0.9/17 the areas on his left medial ankle are all closed. I think these are largely chronic venous insufficiency however there is no edema. The area on his left heel was a pressure area. READMISSION 08/02/16; this man is a patient that we have had in this clinic on several times before. I believe he has cerebral palsy is nonambulatory. He has chronic venous insufficiency with venous inflammation. He was last year in the fall of 2017 with areas on his left medial ankle left heel and left dorsal foot. He arrives today  with once again a vague history. As mentioned he is nonambulatory he spends most of his day in a pressure offloading boot nevertheless they noted wounds in this area on the medial left ankle 2 weeks ago. There is any other history here I am uncertain 08/15/16; the patient's wounds on his medial ankle actually look better however the area on the left lateral/dorsal foot is covered in black necrotic eschar. Previous formal arterial studies did not show significant arterial disease with normal ABIs and triphasic waveforms. He is immobilized and at risk for pressure although he seems aware protective boots at all times as far as I am aware. 08/22/16; in general his wounds look better. All the wounds on his medial left ankle look improved the area on his lateral foot however continues to be problematic. Patient comes in the clinic today in a new wheelchair he is quite delighted 09/05/16; a wound on the left medial Calf and left dorsal foot are healed. The area on the left lateral foot is improved however the area on the left medial malleolus is inflamed with a considerable degree of surrounding erythema. This looks like cellulitis. ABIs and clinical exam are within normal limits 09/12/16; the patient continues to have a wound on the left medial calf. I was concerned about cellulitis in this area last week and gave him a course of doxycycline. The wound looks better here. The area on  the left lateral foot however is unchanged 09/20/16; the patient came in this week early after traumatizing his toe on the left foot on a table while moving around in his motorized wheelchair. 09/26/16- he is here for follow-up evaluation of his left foot and malleolus ulcers. Since his appointment he abraded his left medial hallux against a table while in his motorized wheelchair, apparently trying to pull up to the table for a meal. He continues to complain of tenderness to the left lateral foot, surrounding the current ulcer 10/07/16; left foot and malleolus ulcers. 10/14/16; the patient is doing well. He has a wound on the left lateral, left medial malleolus and atraumatic wound on the left great toe. Although these appear to be superficial and closing 10/31/16; the patient is doing well. The area on the left lateral foot, left medial malleolus and the left great toe or all epithelialized. He has home health 11/14/16; patient arrives today with the area on the left great toe healed. The left lateral foot however in the left medial malleolus and the ankle superficial wounds that are slightly larger. He has erythema and tenderness around the area on the left lateral foot. As well he is complaining of dysuria. His caregiver notes that when he gets like this he is more lethargic which indeed seems to be the case today 11/21/16; left great toe is still healed. His wound on the left lateral foot appears healthy and slightly smaller also the area on the left medial ankle. This had black eschar on it which I removed. The remaining wound looks satisfactory 12/12/16; patient hasn't been here in a number of weeks. The area on the left lateral foot is healed however on his medial ankle has 2 open areas one of them covered with a necrotic eschar. He also has a new wound on the right second toe which was apparently trauma induced while he was at a movie 12/27/16; the patient has wounds on his left medial mallelus and left  lateral foot that are just about closed. The area on the right second toe which  was trauma from last time is still open but again everything looks a lot better here. 01/30/17; patient arrives in clinic today and is really been a long time since I've seen him. The wounds are all epithelialized on the left foot this includes his left lateral foot, left medial malleolus. Apparently was in the ER for what of I think was a fungal infection on his buttock's he was prescribed Diflucan and doxycycline I think this is already getting better. READMISSION T is a now 59 year old man who has advanced cerebral palsy and functional quadriplegia. We have had him in this clinic multiple times before for wounds erry on his right ankle, left ankle, left dorsal foot and most recently from 08/02/16 through 01/30/17 with his left medial ankle and left foot and finally right second toe. His attendant from the group home where he lives is very knowledgeable about him. She states that they noticed breakdown in his skin in his feet sometime in January although they could not get an appointment until today in this clinic. The previous wounds have always been felt to be secondary to incidental trauma/pressure areas or possibly some degree of chronic venous insufficiency. He had normal arterial studies in may of 2016. He has definite open wounds on the right lateral foot over the base of the fifth metatarsal, the right second toe DIP, the left medial malleolus. He has concerning areas over the left lateral fifth metatarsal base, retaform purpura over the medial left ankle and perhaps the medial left first toe. 07/21/17; culture we did last week from the base of the right fifth metatarsal grew group G strep. This should've been sensitive to the doxycycline I gave him. The other major wounds are on the right second toe DIP, left medial malleolus, base of the left lateral fifth metatarsal and the medial left ankle and medial left toe. I  felt this was probably microemboli/cholesterol emboli. We have macrovascular arterial studies scheduled for Thursday although I don't think this is what the problem is. 07/28/17; patient arrives with really no improvement. He has a wound on the right second toe, lateral right foot, right medial malleolus which was new this week, left medial malleolus, but looked like a DTI on the left great toe. His macrovascular studies are on 07/30/17 although I would be surprised if this provides all the explanation. He has small areas even on his right second and third toe which look like splinter-type hemorrhages. The question I have is where is this coming from. I'd like to see the vascular studies however before we pursue this. He has had a DVT in the past but is no longer on anticoagulants. I'll need to review his history 08/04/17; the patient had his ARTERIAL STUDIES this showed a right ABI of 1.16 and the left ABI of 1.20. TBI on the left at 0.84. He is at a right first toe amputation. The studies were unchanged from studies in May 2016. It was not felt that he had significant lower extremity arterial disease In the meantime we are using silver alginate to the wounds on the right second toe right lateral foot left medial malleolus. I'm going to change to Silver collagen today. 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 Working him up for this would include an echocardiogram probably a CT scan of the chest and abdomen to look at the major thoracic and abdominal aortic vessels. I  am not going to involve myself in this unless there is more symptoms/signs 08/18/17; the patient arrives today with wounds looking somewhat better. The remaining areas are the right second toe dorsal, right lateral foot and left medial malleolus which only has a small open area. We've been using silver  collagen 09/01/17;right second toe dorsally probes to bone today. This is a deterioration. We've been using silver collagen. Only a small open area remains on the left medial malleolus. 09/09/17; right second toe appears better. There is no probing bone. He has a small area remaining on the left medial malleolus, the right lateral foot. We have been using silver collagen 10/07/17; the patient returns to clinic today after a month hiatus. He has wounds over the right second toe, right lateral foot and the left medial malleolus. We have been using silver collagen. He has home health. 10/24/17; patient has open wounds over the right second toe, now the left fourth toe and a superficial area over his left buttock. Some of the other areas have healed including the left medial malleolus and right lateral foot. Using silver collagen on these wounds 11/07/17; the patient has closed his wounds on the right second toe and left fourth toe although they look all normal. Last time he was here he had a superficial area over the left buttock this is also closed. The right lateral foot wound I had closed out last time although it is seems to have reopened today. 628/19 the areas over the right second and left fourth toe remains closed. The right lateral foot has tightly adherent necrotic surface over. We have been using silver collagen he offloads this and a bunny boot 12/12/17; the patient has reopened the recurrent area over the right second PIP joint. 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 He does not have obvious macrovascular disease. The area over the right lateral foot looks about the same as last time tightly adherent necrotic debris. We've been using Hydrofera Blue 12/25/17; 2 week follow-up. The area over the right second PIP is closed however the area on the right lateral foot is worse with surrounding erythema and pain compatible with cellulitis is  going to need an antibiotic. AS WELL..... We have looked at his buttocks and lower back. He has 3 stage II ulcers. The exact history here is unclear however he is very disabled man with cerebral palsy. He goes to a day program from the group home in which she lives 5 days a week and I think he is on his buttock all day on these visits. Surrounding the wounds see has extensive stage I injury as well. FINALLY it looks as though he has lost weight. I'm not the only staff member that is recognized this 01/08/18 on evaluation today patient appears to be doing better in regard to his gluteal wounds. In fact everything appears to be healed back here although very dry. He has a couple open sores on the scrotal region other than this it is really his foot that is still giving him the most trouble. He did complete the Keflex unfortunately it seems like there's still a lot of erythema in this in fact looks worse than it did previous. I'm gonna culture this today and likely start with a different antibiotic. 01/16/18; I was reassured that the wounds on his buttocks and sacrum are healed. I did not look at these the day. The culture that was done last  week on 01/11/18 of the right lateral foot wounds showed Pseudomonas. Bactrim is not a good choice for this. Ciprofloxacin interacts with his muscle relaxants therefore I prescribed cefdinir 300 twice a day for 7 days. Bactrim coverage of pseudomonas is not reliable although the degree of erythema looks a lot better 01/30/18; the patient has completed his antibiotics. The area does not have infection on the right lateral foot. The wounds look a lot better. We've been using silver alginate 02/12/18; there is on the right lateral foot. Copious amounts of surface eschar. Also similarly over the PIP of the right second toe. We've been using silver alginate to date 03/02/2018; the area that remains is on the right lateral foot. The right second toe is healed. 03/16/2018; the  area there is original wound on the right lateral foot. Still requiring debridement. We are using Hydrofera Blue. The right second toe is still healed/closed 03/30/2018; right lateral foot there is 2 small open areas remaining. They actually look quite healthy we have been using Hydrofera Blue the right second toe remains closed 04/13/2018; right lateral foot. 1 of the 2 small open areas from last time has closed over. The other had surface slough that I removed with a #3 curette. This looks quite healthy. The right second toe remains closed. We are using Hydrofera Blue 05/01/2018; right lateral foot very superficial areas that look like they are trying to close over. He had a new threatened area on the left medial ankle just below the medial malleolus. I think these represent chronic venous insufficiency areas 05/14/2018; the right right lateral foot looks like it closed over as was predicted last time however he has some dark areas subcutaneously erythema superiorly and this is really very tender. This is either coexistent cellulitis or perhaps a deep tissue injury. The area on the left medial ankle is superficial but has some size. We have been using Hydrofera Blue to both areas. The patient complains of pain in his right foot 06/09/2018; right lateral foot was closed over last time but there was coexistent cellulitis around this. I gave him Levaquin. He also has an area on the left medial malleolus. He has not been back to clinic in over 3-1/2 weeks. We are using Hydrofera Blue I think to the left medial malleolus and silver alginate to the right lateral foot 1/21; right lateral foot still non-viable debris over this area and the left medial malleolus. Both of these vigorously washed off with antiseptic gauze. We have been using silver alginate. 2/4; right lateral foot this looks somewhat better. Unfortunately the area over the left medial malleolus has deteriorated. Using silver alginate in both  area 2/18; both wounds arrived today covered in a lot of necrotic debris. We have been using silver alginate. Clearly not making a lot of progress. 3/3; the patient's right second toe is reopened over the PIP. I am not sure why this is happening. He may have microvascular disease. We have checked his macrovascular flow in the past and found it to be normal. He may need an x-ray. The area on the right lateral foot requires debridement. The area on the left lateral ankle is worse. We have been using PolyMem Ag 08/26/18 on evaluation today patient appears to be doing a little worse in regard to his lower extremities based on what I'm seeing what the nursing staff is telling me. His left medial ankle appears potential to be infected there is your thing on want surrounding the wound. His right  second toe is also of concern at this point in my pinion. We have not done x-rays of these areas that may be a good idea to go ahead and proceed with at this point to ensure will not deal with anything such as osteomyelitis. 4/16; it is been a while since I saw this patient. He now has wounds on the right lateral foot, dorsal right second toe, substantially on the left medial malleolus and a small area on the left lateral foot. We have been using PolyMem Silver. Curlex and Coban. He has home health changing the dressing. Since last time he was here he had a culture of the left medial ankle that showed MRSA. He is completed a course/7 days of doxycycline. XRAYS of the left ankle and right foot that were ordered were negative for osteomyelitis 4/30; he arrives in clinic today with some maceration around the wound and a lot of tenderness and erythema in a circumferential area around the right lateral foot. He has wound areas on the right lateral foot dorsal right second toe, largely over the left medial malleolus and a small area over the left lateral foot 5/7; completed his antibiotics from last time right lateral foot  cellulitis is better and the area on his left knee which we discovered after he had been seen by the provider also is improved. He still has extensive wound areas over the right lateral foot, left medial malleolus, right second toe and left lateral foot although all of these look somewhat better with PolyMem 5/15. Wounds on the right lateral foot, right second toe left medial malleolus and the left lateral foot. All of these look somewhat better. We have been using PolyMem Ag 5/26; the wound on the right lateral foot is almost closed over but he still complains of a lot of pain in this area. He has a eschared area on the right second toe but no open wound.. Also concerning today his original wound on the left medial malleolus looks healthy however just inferior to this almost areas of deep tissue injury and there is an area on the left lateral foot also looks like a deep tissue injury. In the past I have wondered whether he might have cholesterol emboli or some other form of a vasculopathy. I wonder whether this could have something to do with this. 6/2; right lateral foot wound is still open I gave him empiric antibiotics last week without really a lot of change she is still complaining of pain in this area. We x- rayed his feet at some point in April these were negative. It would be difficult to do an MRI. The area on his left medial malleolus looks better. Right second toe is healed. He still has what looks to be a small pressure related area on the left lateral foot but it is not open. We are using PolyMem to all wound which seems to be doing nicely 6/16; right lateral foot wound is still open. The wound looks better although there is still a lot of tenderness around it. Rather than more empiric antibiotics I have cultured this today. We are using PolyMem Ag. On the left lateral malleolus the wound appears better. He has a new area on the left lower anterior pretibial area 6/29; 2-week follow-up.  Culture I did of the right lateral foot 2 weeks ago showed MRSA. I gave him 2 weeks of Septra which he should be just about finishing now and this seems better. We got a call from  home health last week to report swelling in the left leg because we were listed as his primary. We referred him back to primary care. He arrives today with intense erythema and tenderness around the 2 wounds on the left medial ankle on the left lower calf. The area on the right lateral foot looks better 7/7; I brought him back this week to follow-up on his left foot which had cellulitis last week. I gave him clindamycin which as far as I know he tolerated. The erythema is better. He has a 3 current wounds one on the right lateral foot a small area on the right lateral malleolus and the more recent area on the left anterior pretibial area. 7/21; bilateral distal lower leg and foot wounds. The area on his right lateral foot looks a lot better. He has an area on his left pretibial area distally. The area on the medial malleolus on the left which is 1 of his worst wounds has closed over. He does not appear to have any open areas in his remaining toes 8/4-Patient returns at 2 weeks, he has the remaining wound on the left lower leg the other 2 wounds have healed. Patient is continued to be treated with silver alginate with a 2 layer wrap on the left 8/18; I been following this patient for a long period for bilateral lower extremity wounds. 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 His wounds are probably pressure although he religiously wears thick bunny boots. He does not have macrovascular disease but I did formal testing on him perhaps 2 or 3 years ago. I have often wondered about either cholesterol emboli or a vasculopathy of some sort given the distal nature of these variable locations etc. He has 2 areas that we have been following one on the left medial lower leg and one on  the right lateral foot. Both of these are mostly healed today there is some eschar over sections of both areas although I did not disturb this. 03/31/2019 on evaluation today patient appears to be doing a little bit more poorly in regard to the sacral region. He has 2 small wounds noted at this point. Fortunately there is no signs of active infection at this time. No fevers, chills, nausea, vomiting, or diarrhea. He is having some pain when he sits on his gluteal region a big part of the reason he has these wounds is likely due to the fact that his wheelchair has not been functioning properly. He does still have a couple areas of deep tissue injury on his lower extremities he does need new Prevalon offloading boots as well. 04/28/2019 on evaluation today patient appears to be doing well with regard to his wounds in particular. His ankle area has fairly small based on what I am seeing currently and his sacral region actually is still open but does not appear to be doing too poorly in my opinion. Fortunately there is no signs of active infection at this time. He was in the hospital unfortunately and this was from 04/08/2019 through 04/12/2019 secondary to persistent diarrhea, dehydration and acute kidney injury. With that being said fortunately the patient does not seem to be doing too poorly at this time which is good news. He is having pain mainly in the sacral region. 06/23/2019 on evaluation today patient appears to be doing well with regard to his sacral region which in fact is completely healed. Unfortunately he has an area of rubbing/pressure on  his right lateral chest/back region. This is where it is rubbing up on his chair. Subsequently he also has an open wound on the left medial malleolus which is not very open but starting to crack and then on the right lateral foot this is much more open at this point. Unfortunately there is no signs of significant improvement in regard to these areas and in  fact there does appear to be evidence of active infection quite significantly. No fevers, chills, nausea, vomiting, or diarrhea. The patient did test positive for COVID-19 that has been greater than 14 days ago although they did not know the exact timing. That is why he was not here for his last appointment. 07/07/2019 upon evaluation today patient actually appears to be doing quite well in regard to his left ankle as well as the chest/back region which is doing great in fact appears to be healed in my opinion. He still has an issue on his lateral right foot that is open and appears to be infected but does have me more concerned. In fact I do believe the doxycycline helped in general for the cellulitis but I believe that may have been more MRSA related based on the culture that we did. What I am seeing right now has more of the blue-green drainage and may be more consistent with a Pseudomonas that I was hoping was not really causing much of an issue but I think it may be at this point on the right lateral foot. For that reason I do think treatment would be a good idea of the reason I avoided the Cipro before was the potential for QT prolongation which could obviously cause problems with the patient with results of interaction with respiratory wound. The tizanidine also had some interactions as well. Nonetheless I think at this point that it would be a possibility for [place] to use topical gentamicin to see if this could be of benefit for the patient underneath the silver alginate dressing. 07/21/2019 upon evaluation today patient appears to be doing fairly well at this point in regard to his lower extremity on the right. He does have a wound on his right lateral foot as well as the right fifth toe and the right fourth toe. Unfortunately he continues to experience significant issues with pressure and trauma to some degree and I think this is just due to the fact that again he is not really not able to control  his legs and what is happening here. Fortunately there is no signs of active infection at this time. 08/04/2019 upon evaluation today patient actually appears to be doing quite well with regard to his toe ulcers I am very pleased in that regard. Unfortunately the foot ulcer is showing some signs of erythema his primary care provider has placed him on clindamycin at this point. Again I do believe this can be beneficial for him. With that being said I do think the patient may benefit from a light Kerlix and Coban wrap to help with some of his edema at this point. 08/18/2019 upon evaluation today patient appears to be doing still poorly in regard to his lateral foot ulcer. This is not very deep but there is some concern about the possibility of osteomyelitis. I think it would be appropriate for [place] to go ahead and have an x-ray at this time. The patient is currently on clindamycin which from his culture which was performed on 06/23/2019 it does appear that he would benefit from that in regard  to the MRSA. However Pseudomonas was also identified then and the patient actually has some blue-green drainage at this point as well. I am much more concerned about the possibility of the Pseudomonas being an infection is causative agent at this point that I am the MRSA. Subsequently Cipro the patient was sensitive to but we never use that is stable use a topical gent due to the fact that unfortunately there was some interaction between the Cipro and the patient's risk for down potentially causing QT prolongation. 08/31/2019; have not seen this patient in quite a period of time. He has a substantial wound on his right lateral foot we have been using gentamicin ointment with Hydrofera Blue over the top. From what I am able to determine he is also on antibiotics perhaps clindamycin although I have not had a chance to look over this. He also has an eschared area on the right fifth toe as well as the right second toe. 4/20;  right lateral foot wound. He comes in today with intense erythema and tenderness around this wound. The wound itself has a surface on it but it doesn't look particularly healthy a lot of gelatinous debris. There is no palpable bone. We've been using gentamicin ointment and Hydrofera Blue. Since the patient was last here he was seen by Dr. Algis Liming of infectious disease. He ordered an MRI that I think is due later this month on 4/30. He apparently also checked a sedimentation rate and C-reactive protein metabolic panel CBC with differential. At the time he was seen he wanted to withhold any microbials pending identification of the depth of the infection although looking at this I don't know that I'm going to be able to hold off for another 10 days. 4/29; he went on to have the MRI that was ordered by Dr. Algis Liming of infectious disease. 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 I had given him cefdinir last week for surrounding cellulitis he is finishing this today. I have communicated with Dr. Algis Liming who wants bone for pathology and culture before proceeding with consideration of IV antibiotics. Fortunately he is due to see Dr. Samuella Cota of podiatry today I am hopeful that the bone biopsy and culture will be considered there 5/4 as I understand things Dr. Samuella Cota actually wants to remove the diseased proximal fifth metatarsal. Looking back at this decision is probably not a bad one. He has good blood flow he is nonambulatory they are apparently going to do this next week. Paradoxically the wound on the lateral part of his foot is actually a lot better. The cellulitis that was so problematic 2 weeks ago also has resolved. 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 The last time I saw him he had a diseased proximal fifth metatarsal. 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 Samuella Cota.  Culture of this showed Pseudomonas and he was followed by infectious disease Dr. Algis Liming treated with ciprofloxacin. 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 He had Integra placed. A CNS showed Pseudomonas again. Since then he has had Silvadene as a primary dressing, then Santyl and more recently Xeroform and then back to Santyl 3 times a week per his attendant who comes with him. Reviewing his records I was really not prepared for the number  of wounds that he currently has. This includes substantial areas on the right lateral foot, right lateral ankle, right fourth toe dorsally, right ankle anteriorly as well as the left lateral ankle and foot. His medical history is unchanged ABIs 1.2 on the right and 1.21 on the left he has not been known to have arterial insufficiency and is not a diabetic Electronic Signature(s) Signed: 03/23/2020 5:01:15 PM By: Baltazar Najjar MD Entered By: Baltazar Najjar on 03/23/2020 12:24:47 -------------------------------------------------------------------------------- Physical Exam Details Patient Name: Date of Service: [name]. 03/23/2020 10:30 A M Medical Record Number: [id number] Patient Account Number: [id number] Date of Birth/Sex: Treating RN: August 03, 1960 (59 y.o. [name] Primary Care Provider: [name] Other Clinician: Referring Provider: Treating Provider/Extender: Reggy Eye in Treatment: 0 Constitutional Sitting or standing Blood Pressure is within target range for patient.Marland Kitchen Respirations regular, non-labored and within target range.. Temperature is normal and within the target range for the patient.. Cardiovascular Dorsalis pedis pulses are palpable bilaterally. He has very poor edema control bilateral. Integumentary (Hair, Skin) No gross erythema around any of the wounds no overt  infection. Psychiatric He appears at baseline. Notes Wound exam He has substantial wounds on the right foot and ankle. On the right lateral foot and right lateral ankle extensive necrotic debris removed with a #5 curette hemostasis with direct pressure. He has 2 areas on the right dorsal ankle of the upper 1 requires debridement the lower 1 does not. He has what looks to be an abrasion on the right fourth toe dorsally Substantial area on the left medial ankle extending into the left foot. This does not require debridement. Electronic Signature(s) Signed: 03/23/2020 5:01:15 PM By: Baltazar Najjar MD Entered By: Baltazar Najjar on 03/23/2020 12:29:33 -------------------------------------------------------------------------------- Physician Orders Details Patient Name: Date of Service: [name], [name] 03/23/2020 10:30 A M Medical Record Number: [id number] Patient Account Number: [id number] Date of Birth/Sex: Treating RN: 1961-01-21 (59 y.o. [name] Primary Care Provider: [name] Other Clinician: Referring Provider: Treating Provider/Extender: Reggy Eye in Treatment: 0 Verbal / Phone Orders: No Diagnosis Coding Follow-up Appointments Return Appointment in 1 week. Dressing Change Frequency Other: - change twice a week. once at wound center and once at home health. Skin Barriers/Peri-Wound Care Moisturizing lotion - both legs. Wound Cleansing Clean wound with Wound Cleanser May shower with protection. - use cast protectors. Primary Wound Dressing Wound #40 Left,Medial Lower Leg Silver Collagen - saline moisten gauze backing the collagen. Wound #41 Right T Fourth oe Silver Collagen - saline moisten gauze backing the collagen. Wound #42 Right,Medial Malleolus Silver Collagen - saline moisten gauze backing the collagen. Wound #43 Right,Distal,Anterior Lower Leg Silver Collagen - saline moisten gauze backing the collagen. Wound #44  Right,Proximal,Lateral Foot Silver Collagen - saline moisten gauze backing the collagen. Wound #45 Right,Distal,Lateral Foot Silver Collagen - saline moisten gauze backing the collagen. Secondary Dressing Dry Gauze ABD pad Wound #41 Right T Fourth oe Kerlix/Rolled Gauze Dry Gauze Edema Control 3 Layer Compression System - Bilateral Elevate legs to the level of the heart or above for 30 minutes daily and/or when sitting, a frequency of: - throughout the day. Off-Loading Multipodus Splint to: - or bunny boots to both feet. float feet while resting in chair and bed. Additional Orders / Instructions Other: - **discontinue santyl** Home Health Continue Home Health skilled nursing for wound care. - [facility] home [facility] Patient Medications llergies: adhesive tape A Notifications Medication Indication Start End lidocaine DOSE topical 4 %  gel - gel topical applied only with debridements by MD. Electronic Signature(s) Signed: 03/23/2020 5:01:15 PM By: Baltazar Najjar MD Signed: 03/23/2020 5:24:43 PM By: Shawn Stall Entered By: Shawn Stall on 03/23/2020 12:11:43 Prescription 03/23/2020 -------------------------------------------------------------------------------- Madie Reno MD Patient Name: Provider: 02-05-1961 [id number] Date of Birth: NPI#: [name] [id number] Sex: DEA #: [phone] [id number] Phone #: License #: Eligha Bridegroom [facility] Patient Address: [address] [place], [place] [address] [address] [place], [place] [address] [phone] Allergies adhesive tape Medication Medication: Route: Strength: Form: lidocaine 4 % topical gel topical 4% gel Class: TOPICAL LOCAL ANESTHETICS Dose: Frequency / Time: Indication: gel topical applied only with debridements by MD. Number of Refills: Number of Units: 0 Generic Substitution: Start Date: End Date: One Time Use: Substitution Permitted No Note to  Pharmacy: Hand Signature: Date(s): Electronic Signature(s) Signed: 03/23/2020 5:01:15 PM By: Baltazar Najjar MD Signed: 03/23/2020 5:24:43 PM By: Shawn Stall Entered By: Shawn Stall on 03/23/2020 12:11:44 -------------------------------------------------------------------------------- Problem List Details Patient Name: Date of Service: [name]. 03/23/2020 10:30 A M Medical Record Number: [id number] Patient Account Number: [id number] Date of Birth/Sex: Treating RN: July 20, 1960 (59 y.o. [name] Primary Care Provider: [name] Other Clinician: Referring Provider: Treating Provider/Extender: Reggy Eye in Treatment: 0 Active Problems ICD-10 Encounter Code Description Active Date MDM Diagnosis I89.0 Lymphedema, not elsewhere classified 03/23/2020 No Yes T81.31XD Disruption of external operation (surgical) wound, not elsewhere classified, 03/23/2020 No Yes subsequent encounter S90.811D Abrasion, right foot, subsequent encounter 03/23/2020 No Yes L97.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 layer exposed 03/23/2020 No Yes L97.312 Non-pressure chronic ulcer of right 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 03/23/2020 No Yes L97.322 Non-pressure chronic ulcer of left 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 03/23/2020 10:30 A M Medical Record Number: [id number] Patient Account Number: [id number] Date of Birth/Sex: Treating RN: 1961/03/24 (59 y.o. [name] Primary Care Provider: [name] Other Clinician: Referring Provider: Treating Provider/Extender: Reggy Eye in Treatment: 0 Subjective Chief Complaint Information obtained from Patient Patient is here for multiple wounds on his bilateral feet and left ankle. 03/23/2020; patient is here for review of multiple wounds on his bilateral feet and ankles History of Present Illness (HPI) 11/23/15; this is a patient I remember from a previous stay in this clinic in 2012. The patient says this was for a wound in this same area as currently although I have no information to verify that. Most of the information is provided by his caregiver from the group home where he resides. Apparently he has had a wound in this area for 8 months. He also has erythema around this area and has had multiple rounds of antibiotics apparently with some improvement but the erythema is persists. He apparently had a fracture in the ankle 2 months ago and was seen by Dr. Lajoyce Corners . He apparently had a splint applied and more recently a heel offloading boot. He had home health coming out to a week ago not clear what they are dressing this with but they apparently discharged him perhaps because of one of Dr. 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 He has a Nurse, adult for transferring at home  he is nonambulatory and does not wear footwear. He is not a diabetic. Looking through [facility] he appears to have fairly active primary care. The area on the ankle is variably labeled as a pressure area and/or chronic venous insufficiency. He had a recent venous ultrasound on 5/25 that did not show a DVT in the left leg however this was not a reflux study. There was no superficial DVT either. Arterial studies on 10/15/14 showed a left ABI of 1.16 Center right of 1.12 waveforms were triphasic he does not have significant arterial disease. He has had recent x-rays of the left foot and ankle. The left foot did not show any abnormality. Left ankle x-ray showed a spiral fracture of the left tibial  diaphysis without evidence of osteomyelitis. 11/30/15 culture I did last week showed pseudomonas I changed him to oral ciprofloxacin. Erythema is a lot better. 12/07/15 area around the wound shows considerable improvement of the periwound erythema. 12/14/15; the patient has a improvement of the periwound erythema which in retrospect I think with cellulitis successfully treated. We have been using Iodoflex started last week 12/25/15 wound on the left medial malleolus and dorsal left foot. Theraskin #1 applied READMISSION 02/27/16; the patient was admitted to hospital from 8/14 through 01/18/16 initially felt to have cellulitis of his left lower leg however he was noted to have significant persistent pain and swelling. A duplex ultrasound was ordered that was negative. Obie Dredge an x-ray was done that showed a spiral fracture of the left leg. With posterior displacement and angulation at the mid left tibia. Orthopedics saw the patient and splinted the leg. He was then sent to [facility] skilled facility and only return to his group home last week. He has wounds on the left medial malleolus, left dorsal foot and a worrisome area on the plantar left heel which is not really open but may represent a hematoma or a DTI 03/06/16; areas of left anterior and medial ankle both look improved. There are 3 small wounds here. He has a area over the dorsal left first metatarsal head which is not open. In meantime his left heel has opened 03/14/16 the medial left ankle wounds looks stable to improved. The area on the posterior left heel has opened up and had a very adherent eschar and slept over this 03/21/16 patient has 3 wounds on his left medial ankle is stable area on his left dorsal first metatarsal head appears to be improved. Continued problems with adherent eschar over the left heel 03/28/16; patient has 3 wounds on his left medial ankle and lower leg all of these appear to be stable. The problem continues to be the  left heel and the adherent eschar. This may have been a pressure ulcer from her brace at one point. He has a English as a second language teacher we have been using [employer] and all wound areas. Dressings are being changed by home health 04/04/16; the patient's areas on the medial aspect of the left ankle looks superficial and appear to be gradually closing. The area on the plantar aspects/Achilles aspect of his left heel as surface slough and nonviable subcutaneous tissue requiring debridement. 04/11/16; the areas on the patient's medial left ankle appear to be superficial and appear to be closing. The area on the plantar heel on the left has surface slough and nonviable subcutaneous tissue requiring debridement. I don't think this is too much different from last week there is also undermining 04/25/16; patient is followed in his group home by [facility] home  health. The area on the plantar heel is smaller but with still with some depth. We're using Iodoflex to this. The areas on the left ankle looks healthy but dry and somewhat larger. Cause of this is not really clear using Prisma 05/02/16; areas x3 all look improved expecially the areas on the medial ankle. Heel wound is smaller but still deep 05/09/16; all areas o3 look improved on the medial ankle. Heel wound is also contracting. 12/0.9/17 the areas on his left medial ankle are all closed. I think these are largely chronic venous insufficiency however there is no edema. The area on his left heel was a pressure area. READMISSION 08/02/16; this man is a patient that we have had in this clinic on several times before. I believe he has cerebral palsy is nonambulatory. He has chronic venous insufficiency with venous inflammation. He was last year in the fall of 2017 with areas on his left medial ankle left heel and left dorsal foot. He arrives today with once again a vague history. As mentioned he is nonambulatory he spends most of his day in a pressure offloading boot  nevertheless they noted wounds in this area on the medial left ankle 2 weeks ago. There is any other history here I am uncertain 08/15/16; the patient's wounds on his medial ankle actually look better however the area on the left lateral/dorsal foot is covered in black necrotic eschar. Previous formal arterial studies did not show significant arterial disease with normal ABIs and triphasic waveforms. He is immobilized and at risk for pressure although he seems aware protective boots at all times as far as I am aware. 08/22/16; in general his wounds look better. All the wounds on his medial left ankle look improved the area on his lateral foot however continues to be problematic. Patient comes in the clinic today in a new wheelchair he is quite delighted 09/05/16; a wound on the left medial Calf and left dorsal foot are healed. The area on the left lateral foot is improved however the area on the left medial malleolus is inflamed with a considerable degree of surrounding erythema. This looks like cellulitis. ABIs and clinical exam are within normal limits 09/12/16; the patient continues to have a wound on the left medial calf. I was concerned about cellulitis in this area last week and gave him a course of doxycycline. The wound looks better here. The area on the left lateral foot however is unchanged 09/20/16; the patient came in this week early after traumatizing his toe on the left foot on a table while moving around in his motorized wheelchair. 09/26/16- he is here for follow-up evaluation of his left foot and malleolus ulcers. Since his appointment he abraded his left medial hallux against a table while in his motorized wheelchair, apparently trying to pull up to the table for a meal. He continues to complain of tenderness to the left lateral foot, surrounding the current ulcer 10/07/16; left foot and malleolus ulcers. 10/14/16; the patient is doing well. He has a wound on the left lateral, left medial  malleolus and atraumatic wound on the left great toe. Although these appear to be superficial and closing 10/31/16; the patient is doing well. The area on the left lateral foot, left medial malleolus and the left great toe or all epithelialized. He has home health 11/14/16; patient arrives today with the area on the left great toe healed. The left lateral foot however in the left medial malleolus and the ankle superficial wounds that  are slightly larger. He has erythema and tenderness around the area on the left lateral foot. As well he is complaining of dysuria. His caregiver notes that when he gets like this he is more lethargic which indeed seems to be the case today 11/21/16; left great toe is still healed. His wound on the left lateral foot appears healthy and slightly smaller also the area on the left medial ankle. This had black eschar on it which I removed. The remaining wound looks satisfactory 12/12/16; patient hasn't been here in a number of weeks. The area on the left lateral foot is healed however on his medial ankle has 2 open areas one of them covered with a necrotic eschar. He also has a new wound on the right second toe which was apparently trauma induced while he was at a movie 12/27/16; the patient has wounds on his left medial mallelus and left lateral foot that are just about closed. The area on the right second toe which was trauma from last time is still open but again everything looks a lot better here. 01/30/17; patient arrives in clinic today and is really been a long time since I've seen him. The wounds are all epithelialized on the left foot this includes his left lateral foot, left medial malleolus. Apparently was in the ER for what of I think was a fungal infection on his buttock's he was prescribed Diflucan and doxycycline I think this is already getting better. READMISSION T is a now 59 year old man who has advanced cerebral palsy and functional quadriplegia. We have had him in  this clinic multiple times before for wounds erry on his right ankle, left ankle, left dorsal foot and most recently from 08/02/16 through 01/30/17 with his left medial ankle and left foot and finally right second toe. His attendant from the group home where he lives is very knowledgeable about him. She states that they noticed breakdown in his skin in his feet sometime in January although they could not get an appointment until today in this clinic. The previous wounds have always been felt to be secondary to incidental trauma/pressure areas or possibly some degree of chronic venous insufficiency. He had normal arterial studies in may of 2016. He has definite open wounds on the right lateral foot over the base of the fifth metatarsal, the right second toe DIP, the left medial malleolus. He has concerning areas over the left lateral fifth metatarsal base, retaform purpura over the medial left ankle and perhaps the medial left first toe. 07/21/17; culture we did last week from the base of the right fifth metatarsal grew group G strep. This should've been sensitive to the doxycycline I gave him. The other major wounds are on the right second toe DIP, left medial malleolus, base of the left lateral fifth metatarsal and the medial left ankle and medial left toe. 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 We have macrovascular arterial studies scheduled for Thursday although I don't think this is what the problem is. 07/28/17; patient arrives with really no improvement. He has a wound on the right second toe, lateral right foot, right medial malleolus which was new this week, left medial malleolus, but looked like a DTI on the left great toe. His macrovascular studies are on 07/30/17 although I would be surprised if this provides all the explanation. He has small areas even on his right second and third toe which look like splinter-type hemorrhages. The question I have is where is this coming from.  I'd like to see the vascular studies however before we pursue this. He has had a DVT in the past but is no longer on anticoagulants. I'll need to review his history 08/04/17; the patient had his ARTERIAL STUDIES this showed a right ABI of 1.16 and the left ABI of 1.20. TBI on the left at 0.84. He is at a right first toe amputation. The studies were unchanged from studies in May 2016. It was not felt that he had significant lower extremity arterial disease In the meantime we are using silver alginate to the wounds on the right second toe right lateral foot left medial malleolus. I'm going to change to Silver collagen today. 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 Working him up for this would include an echocardiogram probably a CT scan of the chest and abdomen to look at the major thoracic and abdominal aortic vessels. I am not going to involve myself in this unless there is more symptoms/signs 08/18/17; the patient arrives today with wounds looking somewhat better. The remaining areas are the right second toe dorsal, right lateral foot and left medial malleolus which only has a small open area. We've been using silver collagen 09/01/17;right second toe dorsally probes to bone today. This is a deterioration. We've been using silver collagen. Only a small open area remains on the left medial malleolus. 09/09/17; right second toe appears better. There is no probing bone. He has a small area remaining on the left medial malleolus, the right lateral foot. We have been using silver collagen 10/07/17; the patient returns to clinic today after a month hiatus. He has wounds over the right second toe, right lateral foot and the left medial malleolus. We have been using silver collagen. He has home health. 10/24/17; patient has open wounds over the right second toe, now the left fourth  toe and a superficial area over his left buttock. Some of the other areas have healed including the left medial malleolus and right lateral foot. Using silver collagen on these wounds 11/07/17; the patient has closed his wounds on the right second toe and left fourth toe although they look all normal. Last time he was here he had a superficial area over the left buttock this is also closed. The right lateral foot wound I had closed out last time although it is seems to have reopened today. 628/19 the areas over the right second and left fourth toe remains closed. The right lateral foot has tightly adherent necrotic surface over. We have been using silver collagen he offloads this and a bunny boot 12/12/17; the patient has reopened the recurrent area over the right second PIP joint. 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 He does not have obvious macrovascular disease. The area over the right lateral foot looks about the same as last time tightly adherent necrotic debris. We've been using Hydrofera Blue 12/25/17; 2 week follow-up. The area over the right second PIP is closed however the area on the right lateral foot is worse with surrounding erythema and pain compatible with cellulitis is going to need an antibiotic. ooAS WELL..... We have looked at his buttocks and lower back. He has 3 stage II ulcers. The exact history here is unclear however he is very disabled man with cerebral palsy. He goes to a day program from the group home  in which she lives 5 days a week and I think he is on his buttock all day on these visits. Surrounding the wounds see has extensive stage I injury as well. ooFINALLY it looks as though he has lost weight. I'm not the only staff member that is recognized this 01/08/18 on evaluation today patient appears to be doing better in regard to his gluteal wounds. In fact everything appears to be healed back here although  very dry. He has a couple open sores on the scrotal region other than this it is really his foot that is still giving him the most trouble. He did complete the Keflex unfortunately it seems like there's still a lot of erythema in this in fact looks worse than it did previous. I'm gonna culture this today and likely start with a different antibiotic. 01/16/18; I was reassured that the wounds on his buttocks and sacrum are healed. I did not look at these the day. The culture that was done last week on 01/11/18 of the right lateral foot wounds showed Pseudomonas. Bactrim is not a good choice for this. Ciprofloxacin interacts with his muscle relaxants therefore I prescribed cefdinir 300 twice a day for 7 days. Bactrim coverage of pseudomonas is not reliable although the degree of erythema looks a lot better 01/30/18; the patient has completed his antibiotics. The area does not have infection on the right lateral foot. The wounds look a lot better. We've been using silver alginate 02/12/18; there is on the right lateral foot. Copious amounts of surface eschar. Also similarly over the PIP of the right second toe. We've been using silver alginate to date 03/02/2018; the area that remains is on the right lateral foot. The right second toe is healed. 03/16/2018; the area there is original wound on the right lateral foot. Still requiring debridement. We are using Hydrofera Blue. The right second toe is still healed/closed 03/30/2018; right lateral foot there is 2 small open areas remaining. They actually look quite healthy we have been using Hydrofera Blue the right second toe remains closed 04/13/2018; right lateral foot. 1 of the 2 small open areas from last time has closed over. The other had surface slough that I removed with a #3 curette. This looks quite healthy. The right second toe remains closed. We are using Hydrofera Blue 05/01/2018; right lateral foot very superficial areas that look like they are trying  to close over. He had a new threatened area on the left medial ankle just below the medial malleolus. I think these represent chronic venous insufficiency areas 05/14/2018; the right right lateral foot looks like it closed over as was predicted last time however he has some dark areas subcutaneously erythema superiorly and this is really very tender. This is either coexistent cellulitis or perhaps a deep tissue injury. The area on the left medial ankle is superficial but has some size. We have been using Hydrofera Blue to both areas. The patient complains of pain in his right foot 06/09/2018; right lateral foot was closed over last time but there was coexistent cellulitis around this. I gave him Levaquin. He also has an area on the left medial malleolus. He has not been back to clinic in over 3-1/2 weeks. We are using Hydrofera Blue I think to the left medial malleolus and silver alginate to the right lateral foot 1/21; right lateral foot still non-viable debris over this area and the left medial malleolus. Both of these vigorously washed off with antiseptic gauze. We have been  using silver alginate. 2/4; right lateral foot this looks somewhat better. Unfortunately the area over the left medial malleolus has deteriorated. Using silver alginate in both area 2/18; both wounds arrived today covered in a lot of necrotic debris. We have been using silver alginate. Clearly not making a lot of progress. 3/3; the patient's right second toe is reopened over the PIP. I am not sure why this is happening. He may have microvascular disease. We have checked his macrovascular flow in the past and found it to be normal. He may need an x-ray. The area on the right lateral foot requires debridement. The area on the left lateral ankle is worse. We have been using PolyMem Ag 08/26/18 on evaluation today patient appears to be doing a little worse in regard to his lower extremities based on what I'm seeing what the nursing  staff is telling me. His left medial ankle appears potential to be infected there is your thing on want surrounding the wound. His right second toe is also of concern at this point in my pinion. We have not done x-rays of these areas that may be a good idea to go ahead and proceed with at this point to ensure will not deal with anything such as osteomyelitis. 4/16; it is been a while since I saw this patient. He now has wounds on the right lateral foot, dorsal right second toe, substantially on the left medial malleolus and a small area on the left lateral foot. We have been using PolyMem Silver. Curlex and Coban. He has home health changing the dressing. Since last time he was here he had a culture of the left medial ankle that showed MRSA. He is completed a course/7 days of doxycycline. XRAYS of the left ankle and right foot that were ordered were negative for osteomyelitis 4/30; he arrives in clinic today with some maceration around the wound and a lot of tenderness and erythema in a circumferential area around the right lateral foot. He has wound areas on the right lateral foot dorsal right second toe, largely over the left medial malleolus and a small area over the left lateral foot 5/7; completed his antibiotics from last time right lateral foot cellulitis is better and the area on his left knee which we discovered after he had been seen by the provider also is improved. He still has extensive wound areas over the right lateral foot, left medial malleolus, right second toe and left lateral foot although all of these look somewhat better with PolyMem 5/15. Wounds on the right lateral foot, right second toe left medial malleolus and the left lateral foot. All of these look somewhat better. We have been using PolyMem Ag 5/26; the wound on the right lateral foot is almost closed over but he still complains of a lot of pain in this area. He has a eschared area on the right second toe but no open  wound.Geoffry Paradise concerning today his original wound on the left medial malleolus looks healthy however just inferior to this almost areas of deep tissue injury and there is an area on the left lateral foot also looks like a deep tissue injury. In the past I have wondered whether he might have cholesterol emboli or some other form of a vasculopathy. I wonder whether this could have something to do with this. 6/2; right lateral foot wound is still open I gave him empiric antibiotics last week without really a lot of change she is still complaining of pain in  this area. We x- rayed his feet at some point in April these were negative. It would be difficult to do an MRI. The area on his left medial malleolus looks better. Right second toe is healed. He still has what looks to be a small pressure related area on the left lateral foot but it is not open. We are using PolyMem to all wound which seems to be doing nicely 6/16; right lateral foot wound is still open. The wound looks better although there is still a lot of tenderness around it. Rather than more empiric antibiotics I have cultured this today. We are using PolyMem Ag. On the left lateral malleolus the wound appears better. He has a new area on the left lower anterior pretibial area 6/29; 2-week follow-up. Culture I did of the right lateral foot 2 weeks ago showed MRSA. I gave him 2 weeks of Septra which he should be just about finishing now and this seems better. We got a call from home health last week to report swelling in the left leg because we were listed as his primary. We referred him back to primary care. He arrives today with intense erythema and tenderness around the 2 wounds on the left medial ankle on the left lower calf. The area on the right lateral foot looks better 7/7; I brought him back this week to follow-up on his left foot which had cellulitis last week. I gave him clindamycin which as far as I know he tolerated. The erythema  is better. He has a 3 current wounds one on the right lateral foot a small area on the right lateral malleolus and the more recent area on the left anterior pretibial area. 7/21; bilateral distal lower leg and foot wounds. The area on his right lateral foot looks a lot better. He has an area on his left pretibial area distally. The area on the medial malleolus on the left which is 1 of his worst wounds has closed over. He does not appear to have any open areas in his remaining toes 8/4-Patient returns at 2 weeks, he has the remaining wound on the left lower leg the other 2 wounds have healed. Patient is continued to be treated with silver alginate with a 2 layer wrap on the left 8/18; I been following this patient for a long period for bilateral lower extremity wounds. 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 His wounds are probably pressure although he religiously wears thick bunny boots. He does not have macrovascular disease but I did formal testing on him perhaps 2 or 3 years ago. I have often wondered about either cholesterol emboli or a vasculopathy of some sort given the distal nature of these variable locations etc. He has 2 areas that we have been following one on the left medial lower leg and one on the right lateral foot. Both of these are mostly healed today there is some eschar over sections of both areas although I did not disturb this. 03/31/2019 on evaluation today patient appears to be doing a little bit more poorly in regard to the sacral region. He has 2 small wounds noted at this point. Fortunately there is no signs of active infection at this time. No fevers, chills, nausea, vomiting, or diarrhea. He is having some pain when he sits on his gluteal region a big part of the reason he has these wounds is likely due to the fact that his wheelchair has not been  functioning properly. He does still have a couple areas of deep tissue  injury on his lower extremities he does need new Prevalon offloading boots as well. 04/28/2019 on evaluation today patient appears to be doing well with regard to his wounds in particular. His ankle area has fairly small based on what I am seeing currently and his sacral region actually is still open but does not appear to be doing too poorly in my opinion. Fortunately there is no signs of active infection at this time. He was in the hospital unfortunately and this was from 04/08/2019 through 04/12/2019 secondary to persistent diarrhea, dehydration and acute kidney injury. With that being said fortunately the patient does not seem to be doing too poorly at this time which is good news. He is having pain mainly in the sacral region. 06/23/2019 on evaluation today patient appears to be doing well with regard to his sacral region which in fact is completely healed. Unfortunately he has an area of rubbing/pressure on his right lateral chest/back region. This is where it is rubbing up on his chair. Subsequently he also has an open wound on the left medial malleolus which is not very open but starting to crack and then on the right lateral foot this is much more open at this point. Unfortunately there is no signs of significant improvement in regard to these areas and in fact there does appear to be evidence of active infection quite significantly. No fevers, chills, nausea, vomiting, or diarrhea. The patient did test positive for COVID-19 that has been greater than 14 days ago although they did not know the exact timing. That is why he was not here for his last appointment. 07/07/2019 upon evaluation today patient actually appears to be doing quite well in regard to his left ankle as well as the chest/back region which is doing great in fact appears to be healed in my opinion. He still has an issue on his lateral right foot that is open and appears to be infected but does have me more concerned. In fact I do  believe the doxycycline helped in general for the cellulitis but I believe that may have been more MRSA related based on the culture that we did. What I am seeing right now has more of the blue-green drainage and may be more consistent with a Pseudomonas that I was hoping was not really causing much of an issue but I think it may be at this point on the right lateral foot. For that reason I do think treatment would be a good idea of the reason I avoided the Cipro before was the potential for QT prolongation which could obviously cause problems with the patient with results of interaction with respiratory wound. The tizanidine also had some interactions as well. Nonetheless I think at this point that it would be a possibility for [place] to use topical gentamicin to see if this could be of benefit for the patient underneath the silver alginate dressing. 07/21/2019 upon evaluation today patient appears to be doing fairly well at this point in regard to his lower extremity on the right. He does have a wound on his right lateral foot as well as the right fifth toe and the right fourth toe. Unfortunately he continues to experience significant issues with pressure and trauma to some degree and I think this is just due to the fact that again he is not really not able to control his legs and what is happening here. Fortunately there is  no signs of active infection at this time. 08/04/2019 upon evaluation today patient actually appears to be doing quite well with regard to his toe ulcers I am very pleased in that regard. Unfortunately the foot ulcer is showing some signs of erythema his primary care provider has placed him on clindamycin at this point. Again I do believe this can be beneficial for him. With that being said I do think the patient may benefit from a light Kerlix and Coban wrap to help with some of his edema at this point. 08/18/2019 upon evaluation today patient appears to be doing still poorly in regard  to his lateral foot ulcer. This is not very deep but there is some concern about the possibility of osteomyelitis. I think it would be appropriate for [place] to go ahead and have an x-ray at this time. The patient is currently on clindamycin which from his culture which was performed on 06/23/2019 it does appear that he would benefit from that in regard to the MRSA. However Pseudomonas was also identified then and the patient actually has some blue-green drainage at this point as well. I am much more concerned about the possibility of the Pseudomonas being an infection is causative agent at this point that I am the MRSA. Subsequently Cipro the patient was sensitive to but we never use that is stable use a topical gent due to the fact that unfortunately there was some interaction between the Cipro and the patient's risk for down potentially causing QT prolongation. 08/31/2019; have not seen this patient in quite a period of time. He has a substantial wound on his right lateral foot we have been using gentamicin ointment with Hydrofera Blue over the top. From what I am able to determine he is also on antibiotics perhaps clindamycin although I have not had a chance to look over this. He also has an eschared area on the right fifth toe as well as the right second toe. 4/20; right lateral foot wound. He comes in today with intense erythema and tenderness around this wound. The wound itself has a surface on it but it doesn't look particularly healthy a lot of gelatinous debris. There is no palpable bone. We've been using gentamicin ointment and Hydrofera Blue. Since the patient was last here he was seen by Dr. Algis Liming of infectious disease. He ordered an MRI that I think is due later this month on 4/30. He apparently also checked a sedimentation rate and C-reactive protein metabolic panel CBC with differential. At the time he was seen he wanted to withhold any microbials pending identification of the depth of the  infection although looking at this I don't know that I'm going to be able to hold off for another 10 days. 4/29; he went on to have the MRI that was ordered by Dr. Algis Liming of infectious disease. 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 I had given him cefdinir last week for surrounding cellulitis he is finishing this today. I have communicated with Dr. Algis Liming who wants bone for pathology and culture before proceeding with consideration of IV antibiotics. Fortunately he is due to see Dr. Samuella Cota of podiatry today I am hopeful that the bone biopsy and culture will be considered there 5/4 as I understand things Dr. Samuella Cota actually wants to remove the diseased proximal fifth metatarsal. Looking back at this decision is probably not a bad one. He has good blood flow he is nonambulatory they are apparently going to do this  next week. Paradoxically the wound on the lateral part of his foot is actually a lot better. The cellulitis that was so problematic 2 weeks ago also has resolved. 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 The last time I saw him he had a diseased proximal fifth metatarsal. 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 Samuella Cota. Culture of this showed Pseudomonas and he was followed by infectious disease Dr. Algis Liming treated with ciprofloxacin. 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 He had Integra placed. A CNS showed Pseudomonas again. Since then he has had Silvadene as a primary dressing, then Santyl and more recently Xeroform and then back to Santyl 3 times a week per his attendant who comes with him. Reviewing his records I was really not prepared for the number of wounds that he currently has. This includes substantial areas on the right lateral foot,  right lateral ankle, right fourth toe dorsally, right ankle anteriorly as well as the left lateral ankle and foot. His medical history is unchanged ABIs 1.2 on the right and 1.21 on the left he has not been known to have arterial insufficiency and is not a diabetic Patient History Information obtained from Patient. Allergies adhesive tape (Severity: Moderate, Reaction: rash) Family History Unknown History. Social History Never smoker, Marital Status - Single, Alcohol Use - Never, Drug Use - No History, Caffeine Use - Moderate. Medical History Eyes Denies history of Cataracts, Glaucoma, Optic Neuritis Ear/Nose/Mouth/Throat Denies history of Chronic sinus problems/congestion, Middle ear problems Hematologic/Lymphatic Patient has history of Anemia - microcytic Cardiovascular Patient has history of Deep Vein Thrombosis Gastrointestinal Patient has history of Colitis Endocrine Denies history of Type I Diabetes, Type II Diabetes Genitourinary Denies history of End Stage Renal Disease Immunological Denies history of Lupus Erythematosus, Raynaudoos, Scleroderma Integumentary (Skin) Denies history of History of Burn Musculoskeletal Patient has history of Osteomyelitis Neurologic Patient has history of Neuropathy, Quadriplegia - spastic Oncologic Denies history of Received Chemotherapy, Received Radiation Psychiatric Patient has history of Confinement Anxiety Denies history of Anorexia/bulimia Hospitalization/Surgery History - surgery to straughten legs. 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 - hiatal hernia repair. - (R) gt toe amputation. - Constipation. 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 Medical A Surgical History Notes nd Co-Morbid Conditions h/o spiral fracture distal (L) tibia , h/o shingles , MR , infantile CP Ear/Nose/Mouth/Throat hiatal hernia Respiratory h/o pneumonia , chronic bronchitis Cardiovascular hypercholesterolemia Gastrointestinal acute gastric ulcer with  hemorrhage , constipation , GERD , Abdominal Distention Genitourinary incontinence (on Detrol) Renal Glycosuria Integumentary (Skin) Eczema Acne Vulgaris Onychomycosis Pressure Ulcers Musculoskeletal DJD of shoulder Neurologic cerebral palsy Psychiatric general anxiety , bipolar , depression, Mental Retardation Review of Systems (ROS) Constitutional Symptoms (General Health) Denies complaints or symptoms of Fatigue, Fever, Chills, Marked Weight Change. Eyes Denies complaints or symptoms of Dry Eyes, Vision Changes, Glasses / Contacts. Ear/Nose/Mouth/Throat Denies complaints or symptoms of Chronic sinus problems or rhinitis. Respiratory Denies complaints or symptoms of Chronic or frequent coughs, Shortness of Breath. Cardiovascular Denies complaints or symptoms of Chest pain. Endocrine Denies complaints or symptoms of Heat/cold intolerance. Genitourinary Denies complaints or symptoms of Frequent urination. Integumentary (Skin) Complains or has symptoms of Wounds - both feet. Musculoskeletal Complains or has symptoms of Muscle Weakness. Neurologic Complains or has symptoms of Numbness/parasthesias. Psychiatric Denies complaints or symptoms  of Claustrophobia, Suicidal. Objective Constitutional Sitting or standing Blood Pressure is within target range for patient.Marland Kitchen Respirations regular, non-labored and within target range.. Temperature is normal and within the target range for the patient.. Vitals Time Taken: 11:07 AM, Temperature: 97.5 F, Pulse: 82 bpm, Respiratory Rate: 18 breaths/min, Blood Pressure: 132/88 mmHg. Cardiovascular Dorsalis pedis pulses are palpable bilaterally. He has very poor edema control bilateral. Psychiatric He appears at baseline. General Notes: Wound exam ooHe has substantial wounds on the right foot and ankle. On the right lateral foot and right lateral ankle extensive necrotic debris removed with a #5 curette hemostasis with direct pressure. He  has 2 areas on the right dorsal ankle of the upper 1 requires debridement the lower 1 does not. He has what looks to be an abrasion on the right fourth toe dorsally ooSubstantial area on the left medial ankle extending into the left foot. This does not require debridement. Integumentary (Hair, Skin) No gross erythema around any of the wounds no overt infection. Wound #40 status is Open. Original cause of wound was Gradually Appeared. The wound is located on the Left,Medial Lower Leg. The wound measures 4.5cm length x 6.1cm width x 0.1cm depth; 21.559cm^2 area and 2.156cm^3 volume. There is Fat Layer (Subcutaneous Tissue) exposed. There is no tunneling or undermining noted. There is a large amount of serous drainage noted. The wound margin is flat and intact. There is large (67-100%) red granulation within the wound bed. There is a small (1-33%) amount of necrotic tissue within the wound bed including Adherent Slough. Wound #41 status is Open. Original cause of wound was Trauma. The wound is located on the Right T Fourth. The wound measures 1.8cm length x 1cm width oe x 0.1cm depth; 1.414cm^2 area and 0.141cm^3 volume. There is Fat Layer (Subcutaneous Tissue) exposed. There is no tunneling or undermining noted. There is a medium amount of serosanguineous drainage noted. The wound margin is flat and intact. There is no granulation within the wound bed. There is a large (67- 100%) amount of necrotic tissue within the wound bed including Adherent Slough. Wound #42 status is Open. Original cause of wound was Gradually Appeared. The wound is located on the Right,Medial Malleolus. The wound measures 2cm length x 3.2cm width x 0.2cm depth; 5.027cm^2 area and 1.005cm^3 volume. There is Fat Layer (Subcutaneous Tissue) exposed. There is no tunneling or undermining noted. There is a medium amount of serosanguineous drainage noted. The wound margin is flat and intact. There is medium (34-66%) red granulation  within the wound bed. There is a medium (34-66%) amount of necrotic tissue within the wound bed including Adherent Slough. Wound #43 status is Open. Original cause of wound was Pressure Injury. The wound is located on the Right,Distal,Anterior Lower Leg. The wound measures 3.5cm length x 0.5cm width x 0.2cm depth; 1.374cm^2 area and 0.275cm^3 volume. There is Fat Layer (Subcutaneous Tissue) exposed. There is no tunneling or undermining noted. There is a medium amount of serosanguineous drainage noted. The wound margin is flat and intact. There is medium (34-66%) red granulation within the wound bed. There is a medium (34-66%) amount of necrotic tissue within the wound bed including Adherent Slough. Wound #44 status is Open. Original cause of wound was Pressure Injury. The wound is located on the Right,Proximal,Lateral Foot. The wound measures 3.1cm length x 3.1cm width x 0.3cm depth; 7.548cm^2 area and 2.264cm^3 volume. There is Fat Layer (Subcutaneous Tissue) exposed. There is no tunneling or undermining noted. There is a large amount of  serosanguineous drainage noted. The wound margin is flat and intact. There is no granulation within the wound bed. There is a large (67-100%) amount of necrotic tissue within the wound bed including Adherent Slough. Wound #45 status is Open. Original cause of wound was Pressure Injury. The wound is located on the Right,Distal,Lateral Foot. The wound measures 2cm length x 3.4cm width x 0.1cm depth; 5.341cm^2 area and 0.534cm^3 volume. There is Fat Layer (Subcutaneous Tissue) exposed. There is no tunneling or undermining noted. There is a medium amount of serosanguineous drainage noted. The wound margin is flat and intact. There is no granulation within the wound bed. There is a large (67-100%) amount of necrotic tissue within the wound bed including Eschar. 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 There was a Excisional Skin/Subcutaneous Tissue Debridement with a total area of 1 sq cm performed by Maxwell Caul., MD. With the following instrument(s): Curette to remove Viable and Non-Viable tissue/material. Material removed includes Subcutaneous Tissue, Slough, Skin: Dermis, Skin: Epidermis, and Fibrin/Exudate after achieving pain control using Lidocaine 4% Topical Solution. A time out was conducted at 12:00, prior to the start of the procedure. A Moderate amount of bleeding was controlled with Pressure. The procedure was tolerated well with a pain level of 0 throughout and a pain level of 0 following the procedure. Post Debridement Measurements: 3.5cm length x 0.5cm width x 0.2cm depth; 0.275cm^3 volume. Post debridement Stage noted as Category/Stage III. Character of Wound/Ulcer Post Debridement requires further debridement. Severity of Tissue Post Debridement is: Fat layer exposed. Post procedure Diagnosis Wound #43: Same as Pre-Procedure Pre-procedure diagnosis of Wound #43 is a Pressure Ulcer located on the Right,Distal,Anterior Lower Leg . There was a Three Layer Compression Therapy Procedure by Shawn Stall, RN. Post procedure Diagnosis Wound #43: Same as Pre-Procedure Wound #44 Pre-procedure diagnosis of Wound #44 is a Pressure Ulcer located on the Right,Proximal,Lateral Foot . There was a Excisional Skin/Subcutaneous Tissue Debridement with a total area of 9.61 sq cm performed by  Maxwell Caul., MD. With the following instrument(s): Curette to remove Viable and Non-Viable tissue/material. Material removed includes Subcutaneous Tissue, Slough, Skin: Dermis, Skin: Epidermis, and Fibrin/Exudate after achieving pain control using Lidocaine 4% T opical Solution. A time out was conducted at 12:00, prior to the start of the procedure. A Moderate amount of bleeding was controlled with Pressure. The procedure was tolerated well with a pain level of 0 throughout and a pain level of 0 following the procedure. Post Debridement Measurements: 3.1cm length x 3.1cm width x 0.3cm depth; 2.264cm^3 volume. Post debridement Stage noted as Unstageable/Unclassified. Character of Wound/Ulcer Post Debridement requires further debridement. Post procedure Diagnosis Wound #44: Same as Pre-Procedure Pre-procedure diagnosis of Wound #44 is a Pressure Ulcer located on the Right,Proximal,Lateral Foot . There was a Three Layer Compression Therapy Procedure by Shawn Stall, RN. Post procedure Diagnosis Wound #44: Same as Pre-Procedure Wound #45 Pre-procedure diagnosis of Wound #45 is a Pressure Ulcer located on the Right,Distal,Lateral Foot . There was a Excisional Skin/Subcutaneous Tissue Debridement with a total area  of 6.8 sq cm performed by Maxwell Caul., MD. With the following instrument(s): Curette to remove Viable and Non-Viable tissue/material. Material removed includes Subcutaneous Tissue, Slough, Skin: Dermis, Skin: Epidermis, and Fibrin/Exudate after achieving pain control using Lidocaine 4% T opical Solution. A time out was conducted at 12:00, prior to the start of the procedure. A Moderate amount of bleeding was controlled with Pressure. The procedure was tolerated well with a pain level of 0 throughout and a pain level of 0 following the procedure. Post Debridement Measurements: 2cm length x 3.4cm width x 0.1cm depth; 0.534cm^3 volume. Post debridement Stage noted as  Unstageable/Unclassified. Character of Wound/Ulcer Post Debridement requires further debridement. Post procedure Diagnosis Wound #45: Same as Pre-Procedure Pre-procedure diagnosis of Wound #45 is a Pressure Ulcer located on the Right,Distal,Lateral Foot . There was a Three Layer Compression Therapy Procedure by Shawn Stall, RN. Post procedure Diagnosis Wound #45: Same as Pre-Procedure Wound #40 Pre-procedure diagnosis of Wound #40 is a Venous Leg Ulcer located on the Left,Medial Lower Leg . There was a Three Layer Compression Therapy Procedure by Shawn Stall, RN. Post procedure Diagnosis Wound #40: Same as Pre-Procedure Wound #42 Pre-procedure diagnosis of Wound #42 is a Venous Leg Ulcer located on the Right,Medial Malleolus . There was a Three Layer Compression Therapy Procedure by Shawn Stall, RN. Post procedure Diagnosis Wound #42: Same as Pre-Procedure Plan Follow-up Appointments: Return Appointment in 1 week. Dressing Change Frequency: Other: - change twice a week. once at wound center and once at home health. Skin Barriers/Peri-Wound Care: Moisturizing lotion - both legs. Wound Cleansing: Clean wound with Wound Cleanser May shower with protection. - use cast protectors. Primary Wound Dressing: Wound #40 Left,Medial Lower Leg: Silver Collagen - saline moisten gauze backing the collagen. Wound #41 Right T Fourth: oe Silver Collagen - saline moisten gauze backing the collagen. Wound #42 Right,Medial Malleolus: Silver Collagen - saline moisten gauze backing the collagen. Wound #43 Right,Distal,Anterior Lower Leg: Silver Collagen - saline moisten gauze backing the collagen. Wound #44 Right,Proximal,Lateral Foot: Silver Collagen - saline moisten gauze backing the collagen. Wound #45 Right,Distal,Lateral Foot: Silver Collagen - saline moisten gauze backing the collagen. Secondary Dressing: Dry Gauze ABD pad Wound #41 Right T Fourth: oe Kerlix/Rolled Gauze Dry  Gauze Edema Control: 3 Layer Compression System - Bilateral Elevate legs to the level of the heart or above for 30 minutes daily and/or when sitting, a frequency of: - throughout the day. Off-Loading: Multipodus Splint to: - or bunny boots to both feet. float feet while resting in chair and bed. Additional Orders / Instructions: Other: - **discontinue santyl** Home Health: Continue Home Health skilled nursing for wound care. - [facility] home [facility] The following medication(s) was prescribed: lidocaine topical 4 % gel gel topical applied only with debridements by MD. was prescribed at facility 1. For this week we are going to use silver collagen to all wound areas with 3 layer compression bilateral 2. There are multiple possible etiologies for all of these wounds including pressure, postsurgical, however I think a lot of this is uncontrolled lymphedema in the setting of distal lipo dermatosclerosis. 3. The areas I debrided on the right lateral foot and ankle may require a different dressing from the rest and I will make that distinction next week. Iodoflex would come to mind. They were using Santyl and while the idea is sound, only changing this 3 times a week is probably not adequate 4. He does not appear to have an arterial issue. I do not see  any evidence of infection. I was concerned about the possibility of Pseudomonas given the difficulties he had in the intra and postoperative. Although I do not see a lot of evidence of this and I did not reculture anything. I spent 35 minutes in review of this patient's recent past medical history, face-to-face evaluation and preparation of this record Electronic Signature(s) Signed: 03/23/2020 5:01:15 PM By: Baltazar Najjar MD Signed: 03/23/2020 5:24:43 PM By: Shawn Stall Entered By: Shawn Stall on 03/23/2020 16:59:34 -------------------------------------------------------------------------------- HxROS Details Patient Name: Date of  Service: [name], [name] Gess. 03/23/2020 10:30 A M Medical Record Number: [id number] Patient Account Number: [id number] Date of Birth/Sex: Treating RN: 02/23/1961 (59 y.o. [name] Primary Care Provider: [name] Other Clinician: Referring Provider: Treating Provider/Extender: Reggy Eye in Treatment: 0 Information Obtained From Patient Constitutional Symptoms (General Health) Complaints and Symptoms: Negative for: Fatigue; Fever; Chills; Marked Weight Change Eyes Complaints and Symptoms: Negative for: Dry Eyes; Vision Changes; Glasses / Contacts Medical History: Negative for: Cataracts; Glaucoma; Optic Neuritis Ear/Nose/Mouth/Throat Complaints and Symptoms: Negative for: Chronic sinus problems or rhinitis Medical History: Negative for: Chronic sinus problems/congestion; Middle ear problems Past Medical History Notes: hiatal hernia Respiratory Complaints and Symptoms: Negative for: Chronic or frequent coughs; Shortness of Breath Medical History: Past Medical History Notes: h/o pneumonia , chronic bronchitis Cardiovascular Complaints and Symptoms: Negative for: Chest pain Medical History: Positive for: Deep Vein Thrombosis Past Medical History Notes: hypercholesterolemia Endocrine Complaints and Symptoms: Negative for: Heat/cold intolerance Medical History: Negative for: Type I Diabetes; Type II Diabetes Genitourinary Complaints and Symptoms: Negative for: Frequent urination Medical History: Negative for: End Stage Renal Disease Past Medical History Notes: incontinence (on Detrol) Renal Glycosuria Integumentary (Skin) Complaints and Symptoms: Positive for: Wounds - both feet Medical History: Negative for: History of Burn Past Medical History Notes: Eczema Acne Vulgaris Onychomycosis Pressure Ulcers Musculoskeletal Complaints and Symptoms: Positive for: Muscle Weakness Medical History: Positive for:  Osteomyelitis Past Medical History Notes: DJD of shoulder Neurologic Complaints and Symptoms: Positive for: Numbness/parasthesias Medical History: Positive for: Neuropathy; Quadriplegia - spastic Past Medical History Notes: cerebral palsy Psychiatric Complaints and Symptoms: Negative for: Claustrophobia; Suicidal Medical History: Positive for: Confinement Anxiety Negative for: Anorexia/bulimia Past Medical History Notes: general anxiety , bipolar , depression, Mental Retardation Co-Morbid Conditions Medical History: Past Medical History Notes: h/o spiral fracture distal (L) tibia , h/o shingles , MR , infantile CP Hematologic/Lymphatic Medical History: Positive for: Anemia - microcytic Gastrointestinal Medical History: Positive for: Colitis Past Medical History Notes: acute gastric ulcer with hemorrhage , constipation , GERD , Abdominal Distention Immunological Medical History: Negative for: Lupus Erythematosus; Raynauds; Scleroderma Oncologic Medical History: Negative for: Received Chemotherapy; Received Radiation Immunizations Pneumococcal Vaccine: Received Pneumococcal Vaccination: Yes Implantable Devices None Hospitalization / Surgery History Type of Hospitalization/Surgery surgery to straughten legs 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) hiatal hernia repair (R) gt toe amputation Constipation 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 Family and Social History Unknown History: Yes; Never smoker; Marital Status - Single; Alcohol Use: Never; Drug Use: No History; Caffeine Use: Moderate; Financial Concerns: No; Food, Clothing or Shelter Needs: No; Support System Lacking: No; Transportation Concerns: No Psychologist, prison and probation services) Signed: 03/23/2020 5:01:15 PM By: Baltazar Najjar MD Signed: 03/23/2020 5:19:05 PM By: Zenaida Deed RN, BSN Entered By: Zenaida Deed on 03/23/2020 11:12:53 -------------------------------------------------------------------------------- SuperBill  Details Patient Name: Date of Service: [name], [name] 03/23/2020 Medical Record Number: [id number] Patient Account Number: [id number] Date of Birth/Sex: Treating RN: December 05, 1960 (59 y.o. 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 left 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 Facility Procedures CPT4 Code: 47829562 Description: 99214 - WOUND CARE VISIT-LEV 4 EST PT Modifier: Quantity: 1 CPT4 Code: 13086578 Description: 11042 - DEB SUBQ TISSUE 20 SQ CM/< ICD-10 Diagnosis Description 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 Modifier: Quantity: 1 CPT4 Code: 46962952 Description: (Facility Use Only) 29581LT - APPLY MULTLAY COMPRS LWR LT LEG Modifier: 59 Quantity: 1 Physician Procedures : CPT4 Code Description Modifier [id number] 99214 - WC PHYS LEVEL 4 - EST PT 25 ICD-10 Diagnosis Description S90.811D Abrasion, right foot, subsequent encounter L97.312 Non-pressure chronic ulcer of right 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 L97.322 Non-pressure  chronic ulcer of left 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 I89.0 Lymphedema, not elsewhere classified Quantity: 1 : [id number] 11042 - WC PHYS SUBQ TISS 20 SQ CM ICD-10 Diagnosis Description L97.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 layer exposed L97.312  Non-pressure chronic ulcer of right 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 Quantity: 1 Electronic Signature(s) Signed: 03/23/2020 5:01:15 PM By: Baltazar Najjar MD Signed: 03/23/2020 5:24:43 PM By: Shawn Stall Entered By: Shawn Stall on 03/23/2020 16:58:52

## 2020-03-23 NOTE — Progress Notes (Signed)
Secondary Etiology: Anemia, Deep Vein Thrombosis, Anemia, Deep Vein Thrombosis, Anemia, Deep Vein Thrombosis, Comorbid History: Colitis, Osteomyelitis, Neuropathy, Colitis, Osteomyelitis, Neuropathy, Colitis, Osteomyelitis, Neuropathy, Quadriplegia, Confinement Anxiety Quadriplegia, Confinement Anxiety Quadriplegia, Confinement Anxiety 02/21/2020 02/21/2020 02/21/2020 Date Acquired: 0 0 0 Weeks of Treatment: Open Open Open Wound Status: Yes No No Clustered Wound: 2 N/A N/A Clustered Quantity: 3.5x0.5x0.2 3.1x3.1x0.3 2x3.4x0.1 Measurements L x W x D (cm) 1.374 7.548 5.341 A (cm) : rea 0.275 2.264 0.534 Volume (cm) : 0.00% N/A N/A % Reduction in A rea: 0.00% N/A N/A % Reduction in Volume: Category/Stage III Unstageable/Unclassified Unstageable/Unclassified Classification: Medium Large Medium Exudate A mount: Serosanguineous Serosanguineous Serosanguineous Exudate Type: red, brown red, brown red, brown Exudate Color: Flat and Intact Flat and Intact Flat and Intact Wound Margin: Medium (34-66%) None Present (0%) None Present (0%) Granulation A mount: Red N/A N/A Granulation Quality: Medium (34-66%) Large (67-100%) Large (67-100%) Necrotic A mount: Adherent Slough Adherent Slough Eschar Necrotic Tissue: Fat Layer (Subcutaneous Tissue): Yes Fat Layer (Subcutaneous Tissue): Yes Fat Layer (Subcutaneous Tissue): Yes Exposed Structures: Fascia: No Fascia: No Fascia: No Tendon:  No Tendon: No Tendon: No Muscle: No Muscle: No Muscle: No Joint: No Joint: No Joint: No Bone: No Bone: No Bone: No Small (1-33%) None None Epithelialization: Debridement - Excisional Debridement - Excisional Debridement - Excisional Debridement: Pre-procedure Verification/Time Out 12:00 12:00 12:00 Taken: Lidocaine 4% Topical Solution Lidocaine 4% Topical Solution Lidocaine 4% Topical Solution Pain Control: Subcutaneous, Slough Subcutaneous, Slough Subcutaneous, Slough Tissue Debrided: Skin/Subcutaneous Tissue Skin/Subcutaneous Tissue Skin/Subcutaneous Tissue Level: 1 9.61 6.8 Debridement A (sq cm): rea Curette Curette Curette Instrument: Moderate Moderate Moderate Bleeding: Pressure Pressure Pressure Hemostasis A chieved: 0 0 0 Procedural Pain: 0 0 0 Post Procedural Pain: Procedure was tolerated well Procedure was tolerated well Procedure was tolerated well Debridement Treatment Response: 3.5x0.5x0.2 3.1x3.1x0.3 2x3.4x0.1 Post Debridement Measurements L x W x D (cm) 0.275 2.264 0.534 Post Debridement Volume: (cm) Category/Stage III Unstageable/Unclassified Unstageable/Unclassified Post Debridement Stage: Debridement Debridement Debridement Procedures Performed: Treatment Notes Electronic Signature(s) Signed: 03/23/2020 5:01:15 PM By: Baltazar Najjar MD Signed: 03/23/2020 5:24:43 PM By: Shawn Stall Entered By: Baltazar Najjar on 03/23/2020 12:19:29 -------------------------------------------------------------------------------- Multi-Disciplinary Care Plan Details Patient Name: Date of Service: [name] 03/23/2020 10:30 A M Medical Record Number: [id number] Patient Account Number: [id number] Date of Birth/Sex: Treating RN: 02/10/1961 (59 y.o. [name] Primary Care Wiley Magan: [name] Other Clinician: Referring Naoma Boxell: Treating Indie Nickerson/Extender: Reggy Eye in Treatment: 0 Active  Inactive Nutrition Nursing Diagnoses: Potential for alteratiion in Nutrition/Potential for imbalanced nutrition Goals: Patient/caregiver agrees to and verbalizes understanding of need to obtain nutritional consultation Date Initiated: 03/23/2020 Target Resolution Date: 06/06/2020 Goal Status: Active Interventions: Provide education on nutrition Treatment Activities: Patient referred to Primary Care Physician for further nutritional evaluation : 03/23/2020 Notes: Orientation to the Wound Care Program Nursing Diagnoses: Knowledge deficit related to the wound healing center program Goals: Patient/caregiver will verbalize understanding of the Wound Healing Center Program Date Initiated: 03/23/2020 Target Resolution Date: 03/31/2020 Goal Status: Active Interventions: Provide education on orientation to the wound center Notes: Wound/Skin Impairment Nursing Diagnoses: Knowledge deficit related to ulceration/compromised skin integrity Goals: Patient/caregiver will verbalize understanding of skin care regimen Date Initiated: 03/23/2020 Target Resolution Date: 06/22/2020 Goal Status: Active Interventions: Assess patient/caregiver ability to perform ulcer/skin care regimen upon admission and as needed Provide education on ulcer and skin care Treatment Activities: Skin care regimen initiated : 03/23/2020 Topical wound management initiated : 03/23/2020 Notes: Electronic Signature(s) Signed: 03/23/2020 5:24:43  Secondary Etiology: Anemia, Deep Vein Thrombosis, Anemia, Deep Vein Thrombosis, Anemia, Deep Vein Thrombosis, Comorbid History: Colitis, Osteomyelitis, Neuropathy, Colitis, Osteomyelitis, Neuropathy, Colitis, Osteomyelitis, Neuropathy, Quadriplegia, Confinement Anxiety Quadriplegia, Confinement Anxiety Quadriplegia, Confinement Anxiety 02/21/2020 02/21/2020 02/21/2020 Date Acquired: 0 0 0 Weeks of Treatment: Open Open Open Wound Status: Yes No No Clustered Wound: 2 N/A N/A Clustered Quantity: 3.5x0.5x0.2 3.1x3.1x0.3 2x3.4x0.1 Measurements L x W x D (cm) 1.374 7.548 5.341 A (cm) : rea 0.275 2.264 0.534 Volume (cm) : 0.00% N/A N/A % Reduction in A rea: 0.00% N/A N/A % Reduction in Volume: Category/Stage III Unstageable/Unclassified Unstageable/Unclassified Classification: Medium Large Medium Exudate A mount: Serosanguineous Serosanguineous Serosanguineous Exudate Type: red, brown red, brown red, brown Exudate Color: Flat and Intact Flat and Intact Flat and Intact Wound Margin: Medium (34-66%) None Present (0%) None Present (0%) Granulation A mount: Red N/A N/A Granulation Quality: Medium (34-66%) Large (67-100%) Large (67-100%) Necrotic A mount: Adherent Slough Adherent Slough Eschar Necrotic Tissue: Fat Layer (Subcutaneous Tissue): Yes Fat Layer (Subcutaneous Tissue): Yes Fat Layer (Subcutaneous Tissue): Yes Exposed Structures: Fascia: No Fascia: No Fascia: No Tendon:  No Tendon: No Tendon: No Muscle: No Muscle: No Muscle: No Joint: No Joint: No Joint: No Bone: No Bone: No Bone: No Small (1-33%) None None Epithelialization: Debridement - Excisional Debridement - Excisional Debridement - Excisional Debridement: Pre-procedure Verification/Time Out 12:00 12:00 12:00 Taken: Lidocaine 4% Topical Solution Lidocaine 4% Topical Solution Lidocaine 4% Topical Solution Pain Control: Subcutaneous, Slough Subcutaneous, Slough Subcutaneous, Slough Tissue Debrided: Skin/Subcutaneous Tissue Skin/Subcutaneous Tissue Skin/Subcutaneous Tissue Level: 1 9.61 6.8 Debridement A (sq cm): rea Curette Curette Curette Instrument: Moderate Moderate Moderate Bleeding: Pressure Pressure Pressure Hemostasis A chieved: 0 0 0 Procedural Pain: 0 0 0 Post Procedural Pain: Procedure was tolerated well Procedure was tolerated well Procedure was tolerated well Debridement Treatment Response: 3.5x0.5x0.2 3.1x3.1x0.3 2x3.4x0.1 Post Debridement Measurements L x W x D (cm) 0.275 2.264 0.534 Post Debridement Volume: (cm) Category/Stage III Unstageable/Unclassified Unstageable/Unclassified Post Debridement Stage: Debridement Debridement Debridement Procedures Performed: Treatment Notes Electronic Signature(s) Signed: 03/23/2020 5:01:15 PM By: Baltazar Najjar MD Signed: 03/23/2020 5:24:43 PM By: Shawn Stall Entered By: Baltazar Najjar on 03/23/2020 12:19:29 -------------------------------------------------------------------------------- Multi-Disciplinary Care Plan Details Patient Name: Date of Service: [name] 03/23/2020 10:30 A M Medical Record Number: [id number] Patient Account Number: [id number] Date of Birth/Sex: Treating RN: 02/10/1961 (59 y.o. [name] Primary Care Wiley Magan: [name] Other Clinician: Referring Naoma Boxell: Treating Indie Nickerson/Extender: Reggy Eye in Treatment: 0 Active  Inactive Nutrition Nursing Diagnoses: Potential for alteratiion in Nutrition/Potential for imbalanced nutrition Goals: Patient/caregiver agrees to and verbalizes understanding of need to obtain nutritional consultation Date Initiated: 03/23/2020 Target Resolution Date: 06/06/2020 Goal Status: Active Interventions: Provide education on nutrition Treatment Activities: Patient referred to Primary Care Physician for further nutritional evaluation : 03/23/2020 Notes: Orientation to the Wound Care Program Nursing Diagnoses: Knowledge deficit related to the wound healing center program Goals: Patient/caregiver will verbalize understanding of the Wound Healing Center Program Date Initiated: 03/23/2020 Target Resolution Date: 03/31/2020 Goal Status: Active Interventions: Provide education on orientation to the wound center Notes: Wound/Skin Impairment Nursing Diagnoses: Knowledge deficit related to ulceration/compromised skin integrity Goals: Patient/caregiver will verbalize understanding of skin care regimen Date Initiated: 03/23/2020 Target Resolution Date: 06/22/2020 Goal Status: Active Interventions: Assess patient/caregiver ability to perform ulcer/skin care regimen upon admission and as needed Provide education on ulcer and skin care Treatment Activities: Skin care regimen initiated : 03/23/2020 Topical wound management initiated : 03/23/2020 Notes: Electronic Signature(s) Signed: 03/23/2020 5:24:43  Neuropathy, Quadriplegia, Confinement Anxiety Clustered Wound: No Wound Measurements Length: (cm) 1.8 Width: (cm) 1 Depth: (cm) 0.1 Area: (cm) 1.414 Volume: (cm) 0.141 % Reduction in Area: % Reduction in Volume: Epithelialization: None Tunneling: No Undermining: No Wound Description Classification: Full Thickness Without Exposed Support Structures Wound Margin: Flat and Intact Exudate Amount: Medium Exudate Type: Serosanguineous Exudate Color: red, brown Foul Odor After Cleansing: No Slough/Fibrino Yes Wound Bed Granulation Amount: None Present (0%) Exposed Structure Necrotic Amount: Large (67-100%) Fascia Exposed: No Necrotic Quality: Adherent Slough Fat Layer (Subcutaneous Tissue) Exposed: Yes Tendon Exposed: No Muscle Exposed: No Joint Exposed: No Bone Exposed: No Electronic Signature(s) Signed: 03/23/2020 5:19:05 PM By: Zenaida Deed RN, BSN Entered By: Zenaida Deed on 03/23/2020 11:38:50 -------------------------------------------------------------------------------- Wound Assessment Details Patient Name: Date of Service: [name] 03/23/2020 10:30 A M Medical Record Number: [id number] Patient Account Number: [id number] Date of Birth/Sex: Treating RN: April 13, 1961 (59 y.o. [name] Primary Care Martyn Timme: [name] Other Clinician: Referring Melany Wiesman: Treating Marchella Hibbard/Extender: Reggy Eye in Treatment: 0 Wound Status Wound Number: [id number] Primary Venous Leg Ulcer Etiology: Wound Location: Right, Medial  Malleolus Wound Open Wounding Event: Gradually Appeared Status: Date Acquired: 02/21/2020 Comorbid Anemia, Deep Vein Thrombosis, Colitis, Osteomyelitis, Weeks Of Treatment: 0 History: Neuropathy, Quadriplegia, Confinement Anxiety Clustered Wound: Yes Wound Measurements Length: (cm) 2 Width: (cm) 3.2 Depth: (cm) 0.2 Clustered Quantity: 2 Area: (cm) 5.027 Volume: (cm) 1.005 % Reduction in Area: 0% % Reduction in Volume: 0% Epithelialization: Small (1-33%) Tunneling: No Undermining: No Wound Description Classification: Full Thickness Without Exposed Support Structures Wound Margin: Flat and Intact Exudate Amount: Medium Exudate Type: Serosanguineous Exudate Color: red, brown Foul Odor After Cleansing: No Slough/Fibrino Yes Wound Bed Granulation Amount: Medium (34-66%) Exposed Structure Granulation Quality: Red Fascia Exposed: No Necrotic Amount: Medium (34-66%) Fat Layer (Subcutaneous Tissue) Exposed: Yes Necrotic Quality: Adherent Slough Tendon Exposed: No Muscle Exposed: No Joint Exposed: No Bone Exposed: No Electronic Signature(s) Signed: 03/23/2020 5:19:05 PM By: Zenaida Deed RN, BSN Entered By: Zenaida Deed on 03/23/2020 11:46:21 -------------------------------------------------------------------------------- Wound Assessment Details Patient Name: Date of Service: [name] 03/23/2020 10:30 A M Medical Record Number: [id number] Patient Account Number: [id number] Date of Birth/Sex: Treating RN: [birth year] (59 y.o. [name] Primary Care Jodette Wik: [name] Other Clinician: Referring Ferris Tally: Treating Halayna Blane/Extender: Reggy Eye in Treatment: 0 Wound Status Wound Number: [id number] Primary Pressure Ulcer Etiology: Wound Location: Right, Distal, Anterior Lower Leg Secondary Venous Leg Ulcer Wounding Event: Pressure Injury Etiology: Date Acquired: 02/21/2020 Wound Open Weeks Of Treatment:  0 Status: Clustered Wound: Yes Comorbid Anemia, Deep Vein Thrombosis, Colitis, Osteomyelitis, History: Neuropathy, Quadriplegia, Confinement Anxiety Wound Measurements Length: (cm) 3.5 Width: (cm) 0.5 Depth: (cm) 0.2 Clustered Quantity: 2 Area: (cm) 1.374 Volume: (cm) 0.275 % Reduction in Area: 0% % Reduction in Volume: 0% Epithelialization: Small (1-33%) Tunneling: No Undermining: No Wound Description Classification: Category/Stage III Wound Margin: Flat and Intact Exudate Amount: Medium Exudate Type: Serosanguineous Exudate Color: red, brown Foul Odor After Cleansing: No Slough/Fibrino Yes Wound Bed Granulation Amount: Medium (34-66%) Exposed Structure Granulation Quality: Red Fascia Exposed: No Necrotic Amount: Medium (34-66%) Fat Layer (Subcutaneous Tissue) Exposed: Yes Necrotic Quality: Adherent Slough Tendon Exposed: No Muscle Exposed: No Joint Exposed: No Bone Exposed: No Electronic Signature(s) Signed: 03/23/2020 5:19:05 PM By: Zenaida Deed RN, BSN Entered By: Zenaida Deed on 03/23/2020 11:46:51 -------------------------------------------------------------------------------- Wound Assessment Details Patient Name: Date of Service: [name] 03/23/2020 10:30 A M Medical Record Number: [id number] Patient Account Number:  Secondary Etiology: Anemia, Deep Vein Thrombosis, Anemia, Deep Vein Thrombosis, Anemia, Deep Vein Thrombosis, Comorbid History: Colitis, Osteomyelitis, Neuropathy, Colitis, Osteomyelitis, Neuropathy, Colitis, Osteomyelitis, Neuropathy, Quadriplegia, Confinement Anxiety Quadriplegia, Confinement Anxiety Quadriplegia, Confinement Anxiety 02/21/2020 02/21/2020 02/21/2020 Date Acquired: 0 0 0 Weeks of Treatment: Open Open Open Wound Status: Yes No No Clustered Wound: 2 N/A N/A Clustered Quantity: 3.5x0.5x0.2 3.1x3.1x0.3 2x3.4x0.1 Measurements L x W x D (cm) 1.374 7.548 5.341 A (cm) : rea 0.275 2.264 0.534 Volume (cm) : 0.00% N/A N/A % Reduction in A rea: 0.00% N/A N/A % Reduction in Volume: Category/Stage III Unstageable/Unclassified Unstageable/Unclassified Classification: Medium Large Medium Exudate A mount: Serosanguineous Serosanguineous Serosanguineous Exudate Type: red, brown red, brown red, brown Exudate Color: Flat and Intact Flat and Intact Flat and Intact Wound Margin: Medium (34-66%) None Present (0%) None Present (0%) Granulation A mount: Red N/A N/A Granulation Quality: Medium (34-66%) Large (67-100%) Large (67-100%) Necrotic A mount: Adherent Slough Adherent Slough Eschar Necrotic Tissue: Fat Layer (Subcutaneous Tissue): Yes Fat Layer (Subcutaneous Tissue): Yes Fat Layer (Subcutaneous Tissue): Yes Exposed Structures: Fascia: No Fascia: No Fascia: No Tendon:  No Tendon: No Tendon: No Muscle: No Muscle: No Muscle: No Joint: No Joint: No Joint: No Bone: No Bone: No Bone: No Small (1-33%) None None Epithelialization: Debridement - Excisional Debridement - Excisional Debridement - Excisional Debridement: Pre-procedure Verification/Time Out 12:00 12:00 12:00 Taken: Lidocaine 4% Topical Solution Lidocaine 4% Topical Solution Lidocaine 4% Topical Solution Pain Control: Subcutaneous, Slough Subcutaneous, Slough Subcutaneous, Slough Tissue Debrided: Skin/Subcutaneous Tissue Skin/Subcutaneous Tissue Skin/Subcutaneous Tissue Level: 1 9.61 6.8 Debridement A (sq cm): rea Curette Curette Curette Instrument: Moderate Moderate Moderate Bleeding: Pressure Pressure Pressure Hemostasis A chieved: 0 0 0 Procedural Pain: 0 0 0 Post Procedural Pain: Procedure was tolerated well Procedure was tolerated well Procedure was tolerated well Debridement Treatment Response: 3.5x0.5x0.2 3.1x3.1x0.3 2x3.4x0.1 Post Debridement Measurements L x W x D (cm) 0.275 2.264 0.534 Post Debridement Volume: (cm) Category/Stage III Unstageable/Unclassified Unstageable/Unclassified Post Debridement Stage: Debridement Debridement Debridement Procedures Performed: Treatment Notes Electronic Signature(s) Signed: 03/23/2020 5:01:15 PM By: Baltazar Najjar MD Signed: 03/23/2020 5:24:43 PM By: Shawn Stall Entered By: Baltazar Najjar on 03/23/2020 12:19:29 -------------------------------------------------------------------------------- Multi-Disciplinary Care Plan Details Patient Name: Date of Service: [name] 03/23/2020 10:30 A M Medical Record Number: [id number] Patient Account Number: [id number] Date of Birth/Sex: Treating RN: 02/10/1961 (59 y.o. [name] Primary Care Wiley Magan: [name] Other Clinician: Referring Naoma Boxell: Treating Indie Nickerson/Extender: Reggy Eye in Treatment: 0 Active  Inactive Nutrition Nursing Diagnoses: Potential for alteratiion in Nutrition/Potential for imbalanced nutrition Goals: Patient/caregiver agrees to and verbalizes understanding of need to obtain nutritional consultation Date Initiated: 03/23/2020 Target Resolution Date: 06/06/2020 Goal Status: Active Interventions: Provide education on nutrition Treatment Activities: Patient referred to Primary Care Physician for further nutritional evaluation : 03/23/2020 Notes: Orientation to the Wound Care Program Nursing Diagnoses: Knowledge deficit related to the wound healing center program Goals: Patient/caregiver will verbalize understanding of the Wound Healing Center Program Date Initiated: 03/23/2020 Target Resolution Date: 03/31/2020 Goal Status: Active Interventions: Provide education on orientation to the wound center Notes: Wound/Skin Impairment Nursing Diagnoses: Knowledge deficit related to ulceration/compromised skin integrity Goals: Patient/caregiver will verbalize understanding of skin care regimen Date Initiated: 03/23/2020 Target Resolution Date: 06/22/2020 Goal Status: Active Interventions: Assess patient/caregiver ability to perform ulcer/skin care regimen upon admission and as needed Provide education on ulcer and skin care Treatment Activities: Skin care regimen initiated : 03/23/2020 Topical wound management initiated : 03/23/2020 Notes: Electronic Signature(s) Signed: 03/23/2020 5:24:43  Secondary Etiology: Anemia, Deep Vein Thrombosis, Anemia, Deep Vein Thrombosis, Anemia, Deep Vein Thrombosis, Comorbid History: Colitis, Osteomyelitis, Neuropathy, Colitis, Osteomyelitis, Neuropathy, Colitis, Osteomyelitis, Neuropathy, Quadriplegia, Confinement Anxiety Quadriplegia, Confinement Anxiety Quadriplegia, Confinement Anxiety 02/21/2020 02/21/2020 02/21/2020 Date Acquired: 0 0 0 Weeks of Treatment: Open Open Open Wound Status: Yes No No Clustered Wound: 2 N/A N/A Clustered Quantity: 3.5x0.5x0.2 3.1x3.1x0.3 2x3.4x0.1 Measurements L x W x D (cm) 1.374 7.548 5.341 A (cm) : rea 0.275 2.264 0.534 Volume (cm) : 0.00% N/A N/A % Reduction in A rea: 0.00% N/A N/A % Reduction in Volume: Category/Stage III Unstageable/Unclassified Unstageable/Unclassified Classification: Medium Large Medium Exudate A mount: Serosanguineous Serosanguineous Serosanguineous Exudate Type: red, brown red, brown red, brown Exudate Color: Flat and Intact Flat and Intact Flat and Intact Wound Margin: Medium (34-66%) None Present (0%) None Present (0%) Granulation A mount: Red N/A N/A Granulation Quality: Medium (34-66%) Large (67-100%) Large (67-100%) Necrotic A mount: Adherent Slough Adherent Slough Eschar Necrotic Tissue: Fat Layer (Subcutaneous Tissue): Yes Fat Layer (Subcutaneous Tissue): Yes Fat Layer (Subcutaneous Tissue): Yes Exposed Structures: Fascia: No Fascia: No Fascia: No Tendon:  No Tendon: No Tendon: No Muscle: No Muscle: No Muscle: No Joint: No Joint: No Joint: No Bone: No Bone: No Bone: No Small (1-33%) None None Epithelialization: Debridement - Excisional Debridement - Excisional Debridement - Excisional Debridement: Pre-procedure Verification/Time Out 12:00 12:00 12:00 Taken: Lidocaine 4% Topical Solution Lidocaine 4% Topical Solution Lidocaine 4% Topical Solution Pain Control: Subcutaneous, Slough Subcutaneous, Slough Subcutaneous, Slough Tissue Debrided: Skin/Subcutaneous Tissue Skin/Subcutaneous Tissue Skin/Subcutaneous Tissue Level: 1 9.61 6.8 Debridement A (sq cm): rea Curette Curette Curette Instrument: Moderate Moderate Moderate Bleeding: Pressure Pressure Pressure Hemostasis A chieved: 0 0 0 Procedural Pain: 0 0 0 Post Procedural Pain: Procedure was tolerated well Procedure was tolerated well Procedure was tolerated well Debridement Treatment Response: 3.5x0.5x0.2 3.1x3.1x0.3 2x3.4x0.1 Post Debridement Measurements L x W x D (cm) 0.275 2.264 0.534 Post Debridement Volume: (cm) Category/Stage III Unstageable/Unclassified Unstageable/Unclassified Post Debridement Stage: Debridement Debridement Debridement Procedures Performed: Treatment Notes Electronic Signature(s) Signed: 03/23/2020 5:01:15 PM By: Baltazar Najjar MD Signed: 03/23/2020 5:24:43 PM By: Shawn Stall Entered By: Baltazar Najjar on 03/23/2020 12:19:29 -------------------------------------------------------------------------------- Multi-Disciplinary Care Plan Details Patient Name: Date of Service: [name] 03/23/2020 10:30 A M Medical Record Number: [id number] Patient Account Number: [id number] Date of Birth/Sex: Treating RN: 02/10/1961 (59 y.o. [name] Primary Care Wiley Magan: [name] Other Clinician: Referring Naoma Boxell: Treating Indie Nickerson/Extender: Reggy Eye in Treatment: 0 Active  Inactive Nutrition Nursing Diagnoses: Potential for alteratiion in Nutrition/Potential for imbalanced nutrition Goals: Patient/caregiver agrees to and verbalizes understanding of need to obtain nutritional consultation Date Initiated: 03/23/2020 Target Resolution Date: 06/06/2020 Goal Status: Active Interventions: Provide education on nutrition Treatment Activities: Patient referred to Primary Care Physician for further nutritional evaluation : 03/23/2020 Notes: Orientation to the Wound Care Program Nursing Diagnoses: Knowledge deficit related to the wound healing center program Goals: Patient/caregiver will verbalize understanding of the Wound Healing Center Program Date Initiated: 03/23/2020 Target Resolution Date: 03/31/2020 Goal Status: Active Interventions: Provide education on orientation to the wound center Notes: Wound/Skin Impairment Nursing Diagnoses: Knowledge deficit related to ulceration/compromised skin integrity Goals: Patient/caregiver will verbalize understanding of skin care regimen Date Initiated: 03/23/2020 Target Resolution Date: 06/22/2020 Goal Status: Active Interventions: Assess patient/caregiver ability to perform ulcer/skin care regimen upon admission and as needed Provide education on ulcer and skin care Treatment Activities: Skin care regimen initiated : 03/23/2020 Topical wound management initiated : 03/23/2020 Notes: Electronic Signature(s) Signed: 03/23/2020 5:24:43  Carola RhineHUMBLE, Kali L. ([id number]) Visit Report for 03/23/2020 Allergy List Details Patient Name: Date of Service: Carola RhineHUMBLE, Reynol L. 03/23/2020 10:30 A M Medical Record Number: [id number] Patient Account Number: [id number] Date of Birth/Sex: Treating RN: 08-03-1960 (59 y.o. Damaris SchoonerM) Boehlein, Linda Primary Care Donavin Audino: Jamison OkaBakare, Mobolaji Other Clinician: Referring Donevin Sainsbury: Treating Keona Sheffler/Extender: Reggy Eyeobson, Michael Price, Michael Weeks in Treatment: 0 Allergies Active Allergies adhesive tape Reaction: rash Severity: Moderate Allergy Notes Electronic Signature(s) Signed: 03/23/2020 5:19:05 PM By: Zenaida DeedBoehlein, Linda RN, BSN Entered By: Zenaida DeedBoehlein, Linda on 03/23/2020 11:08:12 -------------------------------------------------------------------------------- Arrival Information Details Patient Name: Date of Service: Carola RhineHUMBLE, Keddrick L. 03/23/2020 10:30 A M Medical Record Number: [id number] Patient Account Number: [id number] Date of Birth/Sex: Treating RN: 08-03-1960 (59 y.o. Damaris SchoonerM) Boehlein, Linda Primary Care Kerryann Allaire: Jamison OkaBakare, Mobolaji Other Clinician: Referring Kristian Mogg: Treating Juniper Cobey/Extender: Reggy Eyeobson, Michael Price, Michael Weeks in Treatment: 0 Visit Information Patient Arrived: Wheel Chair Arrival Time: 11:01 Accompanied By: caregiver Transfer Assistance: None Patient Identification Verified: Yes Secondary Verification Process Completed: Yes Patient Requires Transmission-Based Precautions: No Patient Has Alerts: No History Since Last Visit Added or deleted any medications: No Any new allergies or adverse reactions: No Had a fall or experienced change in activities of daily living that may affect risk of falls: Yes Signs or symptoms of abuse/neglect since last visito No Hospitalized since last visit: Yes Has Dressing in Place as Prescribed: Yes Has Footwear/Offloading in Place as Prescribed: Yes Left: Multipodus Split/Boot Right: Multipodus Split/Boot Pain Present Now:  Yes Notes stays in wheelchair Electronic Signature(s) Signed: 03/23/2020 5:19:05 PM By: Zenaida DeedBoehlein, Linda RN, BSN Entered By: Zenaida DeedBoehlein, Linda on 03/23/2020 11:04:45 -------------------------------------------------------------------------------- Clinic Level of Care Assessment Details Patient Name: Date of Service: Carola RhineHUMBLE, Linnie L. 03/23/2020 10:30 A M Medical Record Number: [id number] Patient Account Number: [id number] Date of Birth/Sex: Treating RN: 08-03-1960 (59 y.o. Tammy SoursM) Deaton, Bobbi Primary Care Marcquis Ridlon: Jamison OkaBakare, Mobolaji Other Clinician: Referring Bellamy Judson: Treating Oseas Detty/Extender: Reggy Eyeobson, Michael Price, Michael Weeks in Treatment: 0 Clinic Level of Care Assessment Items TOOL 1 Quantity Score X- 1 0 Use when EandM and Procedure is performed on INITIAL visit ASSESSMENTS - Nursing Assessment / Reassessment X- 1 20 General Physical Exam (combine w/ comprehensive assessment (listed just below) when performed on new pt. evals) X- 1 25 Comprehensive Assessment (HX, ROS, Risk Assessments, Wounds Hx, etc.) ASSESSMENTS - Wound and Skin Assessment / Reassessment X- 1 10 Dermatologic / Skin Assessment (not related to wound area) ASSESSMENTS - Ostomy and/or Continence Assessment and Care []  - 0 Incontinence Assessment and Management []  - 0 Ostomy Care Assessment and Management (repouching, etc.) PROCESS - Coordination of Care []  - 0 Simple Patient / Family Education for ongoing care X- 1 20 Complex (extensive) Patient / Family Education for ongoing care X- 1 10 Staff obtains ChiropractorConsents, Records, T Results / Process Orders est X- 1 10 Staff telephones HHA, Nursing Homes / Clarify orders / etc []  - 0 Routine Transfer to another Facility (non-emergent condition) []  - 0 Routine Hospital Admission (non-emergent condition) X- 1 15 New Admissions / Manufacturing engineernsurance Authorizations / Ordering NPWT Apligraf, etc. , []  - 0 Emergency Hospital Admission (emergent condition) PROCESS -  Special Needs []  - 0 Pediatric / Minor Patient Management []  - 0 Isolation Patient Management []  - 0 Hearing / Language / Visual special needs []  - 0 Assessment of Community assistance (transportation, D/C planning, etc.) []  - 0 Additional assistance / Altered mentation []  - 0 Support Surface(s) Assessment (bed, cushion, seat, etc.) INTERVENTIONS - Miscellaneous []  - 0 External ear exam []  - 0 Patient  Transfer (multiple staff / Nurse, adult / Similar devices) []  - 0 Simple Staple / Suture removal (25 or less) []  - 0 Complex Staple / Suture removal (26 or more) []  - 0 Hypo/Hyperglycemic Management (do not check if billed separately) X- 1 15 Ankle / Brachial Index (ABI) - do not check if billed separately Has the patient been seen at the hospital within the last three years: Yes Total Score: 125 Level Of Care: New/Established - Level 4 Electronic Signature(s) Signed: 03/23/2020 5:24:43 PM By: Shawn Stall Entered By: Shawn Stall on 03/23/2020 16:58:27 -------------------------------------------------------------------------------- Compression Therapy Details Patient Name: Date of Service: DANNIS, DEROCHE 03/23/2020 10:30 A M Medical Record Number: [id number] Patient Account Number: [id number] Date of Birth/Sex: Treating RN: November 14, 1960 (59 y.o. [name] Primary Care Jhayla Podgorski: [name] Other Clinician: Referring Vincy Feliz: Treating Elvis Laufer/Extender: Reggy Eye in Treatment: 0 Compression Therapy Performed for Wound Assessment: Wound #40 Left,Medial Lower Leg Performed By: Clinician Shawn Stall, RN Compression Type: Three Layer Post Procedure Diagnosis Same as Pre-procedure Electronic Signature(s) Signed: 03/23/2020 5:24:43 PM By: Shawn Stall Entered By: Shawn Stall on 03/23/2020 16:59:25 -------------------------------------------------------------------------------- Compression Therapy Details Patient Name: Date  of Service: SHMIEL, MORTON 03/23/2020 10:30 A M Medical Record Number: [id number] Patient Account Number: [id number] Date of Birth/Sex: Treating RN: November 19, 1960 (59 y.o. [name] Primary Care Kallee Nam: [name] Other Clinician: Referring Rakiyah Esch: Treating Paxon Propes/Extender: Reggy Eye in Treatment: 0 Compression Therapy Performed for Wound Assessment: Wound #42 Right,Medial Malleolus Performed By: Clinician Shawn Stall, RN Compression Type: Three Layer Post Procedure Diagnosis Same as Pre-procedure Electronic Signature(s) Signed: 03/23/2020 5:24:43 PM By: Shawn Stall Entered By: Shawn Stall on 03/23/2020 16:59:25 -------------------------------------------------------------------------------- Compression Therapy Details Patient Name: Date of Service: TORREY, BALLINAS 03/23/2020 10:30 A M Medical Record Number: [id number] Patient Account Number: [id number] Date of Birth/Sex: Treating RN: May 19, 1961 (58 y.o. [name] Primary Care Donne Robillard: [name] Other Clinician: Referring Tashiba Timoney: Treating Thurley Francesconi/Extender: Reggy Eye in Treatment: 0 Compression Therapy Performed for Wound Assessment: Wound #43 Right,Distal,Anterior Lower Leg Performed By: Clinician Shawn Stall, RN Compression Type: Three Layer Post Procedure Diagnosis Same as Pre-procedure Electronic Signature(s) Signed: 03/23/2020 5:24:43 PM By: Shawn Stall Entered By: Shawn Stall on 03/23/2020 16:59:26 -------------------------------------------------------------------------------- Compression Therapy Details Patient Name: Date of Service: SHRIYAN, ARAKAWA 03/23/2020 10:30 A M Medical Record Number: [id number] Patient Account Number: [id number] Date of Birth/Sex: Treating RN: Dec 17, 1960 (59 y.o. [name] Primary Care Ammon Muscatello: [name] Other Clinician: Referring Agustina Witzke: Treating  Cleatis Fandrich/Extender: Reggy Eye in Treatment: 0 Compression Therapy Performed for Wound Assessment: Wound #44 Right,Proximal,Lateral Foot Performed By: Clinician Shawn Stall, RN Compression Type: Three Layer Post Procedure Diagnosis Same as Pre-procedure Electronic Signature(s) Signed: 03/23/2020 5:24:43 PM By: Shawn Stall Entered By: Shawn Stall on 03/23/2020 16:59:26 -------------------------------------------------------------------------------- Compression Therapy Details Patient Name: Date of Service: JARAE, PANAS 03/23/2020 10:30 A M Medical Record Number: [id number] Patient Account Number: [id number] Date of Birth/Sex: Treating RN: 1961-02-22 (59 y.o. [name] Primary Care Lou Irigoyen: [name] Other Clinician: Referring Baylen Dea: Treating Ralphael Southgate/Extender: Reggy Eye in Treatment: 0 Compression Therapy Performed for Wound Assessment: Wound #45 Right,Distal,Lateral Foot Performed By: Clinician Shawn Stall, RN Compression Type: Three Layer Post Procedure Diagnosis Same as Pre-procedure Electronic Signature(s) Signed: 03/23/2020 5:24:43 PM By: Shawn Stall Entered By: Shawn Stall on 03/23/2020 16:59:26 -------------------------------------------------------------------------------- Lower Extremity Assessment Details Patient Name: Date of Service: VALENTINE, BARNEY. 03/23/2020 10:30 A M Medical Record

## 2020-03-23 NOTE — Progress Notes (Signed)
BRAXSON, BRANDSMA ([id number]) Visit Report for 03/23/2020 Abuse/Suicide Risk Screen Details Patient Name: Date of Service: [name], [name] 03/23/2020 10:30 A M Medical Record Number: [id number] Patient Account Number: [id number] Date of Birth/Sex: Treating RN: Nov 29, 1960 (59 y.o. [name] Primary Care Korynne Dols: [name] Other Clinician: Referring Terence Bart: Treating Montie Swiderski/Extender: Reggy Eye in Treatment: 0 Abuse/Suicide Risk Screen Items Answer ABUSE RISK SCREEN: Has anyone close to you tried to hurt or harm you recentlyo No Do you feel uncomfortable with anyone in your familyo No Has anyone forced you do things that you didnt want to doo No Electronic Signature(s) Signed: 03/23/2020 5:19:05 PM By: Zenaida Deed RN, BSN Entered By: Zenaida Deed on 03/23/2020 11:13:35 -------------------------------------------------------------------------------- Activities of Daily Living Details Patient Name: Date of Service: [name], [name] 03/23/2020 10:30 A M Medical Record Number: [id number] Patient Account Number: [id number] Date of Birth/Sex: Treating RN: 1961/01/27 (59 y.o. [name] Primary Care Elienai Gailey: [name] Other Clinician: Referring Breanda Greenlaw: Treating Lanitra Battaglini/Extender: Reggy Eye in Treatment: 0 Activities of Daily Living Items Answer Activities of Daily Living (Please select one for each item) Drive Automobile Not Able T Medications ake Need Assistance Use T elephone Need Assistance Care for Appearance Need Assistance Use T oilet Need Assistance Bath / Shower Need Assistance Dress Self Need Assistance Feed Self Completely Able Walk Not Able Get In / Out Bed Need Assistance Housework Not Able Prepare Meals Not Able Handle Money Not Able Shop for Self Need Assistance Electronic Signature(s) Signed: 03/23/2020 5:19:05 PM By: Zenaida Deed RN, BSN Entered By: Zenaida Deed on 03/23/2020 11:14:14 -------------------------------------------------------------------------------- Education Screening Details Patient Name: Date of Service: [name], [name] 03/23/2020 10:30 A M Medical Record Number: [id number] Patient Account Number: [id number] Date of Birth/Sex: Treating RN: 11/12/1960 (58 y.o. [name] Primary Care Kalei Mckillop: [name] Other Clinician: Referring Sylva Overley: Treating Chattie Greeson/Extender: Reggy Eye in Treatment: 0 Primary Learner Assessed: Patient Learning Preferences/Education Level/Primary Language Learning Preference: Explanation, Demonstration Highest Education Level: Grade School Preferred Language: English Cognitive Barrier Language Barrier: No Translator Needed: No Memory Deficit: No Emotional Barrier: Yes cognitive impairment Cultural/Religious Beliefs Affecting Medical Care: No Physical Barrier Impaired Vision: No Impaired Hearing: No Decreased Hand dexterity: No Knowledge/Comprehension Knowledge Level: Medium Comprehension Level: Medium Ability to understand written instructions: Medium Ability to understand verbal instructions: Medium Motivation Anxiety Level: Calm Cooperation: Cooperative Education Importance: Acknowledges Need Interest in Health Problems: Asks Questions Perception: Coherent Willingness to Engage in Self-Management High Activities: Readiness to Engage in Self-Management High Activities: Electronic Signature(s) Signed: 03/23/2020 5:19:05 PM By: Zenaida Deed RN, BSN Entered By: Zenaida Deed on 03/23/2020 11:15:12 -------------------------------------------------------------------------------- Fall Risk Assessment Details Patient Name: Date of Service: [name], [name] Gess. 03/23/2020 10:30 A M Medical Record Number: [id number] Patient Account Number: [id number] Date of Birth/Sex: Treating RN: 1960-10-21 (59 y.o. [name] Primary Care  Alberta Lenhard: [name] Other Clinician: Referring Rozalynn Buege: Treating Eulogio Requena/Extender: Reggy Eye in Treatment: 0 Fall Risk Assessment Items Have you had 2 or more falls in the last 12 monthso 0 No Have you had any fall that resulted in injury in the last 12 monthso 0 No FALLS RISK SCREEN History of falling - immediate or within 3 months 25 Yes Secondary diagnosis (Do you have 2 or more medical diagnoseso) 0 No Ambulatory aid None/bed rest/wheelchair/nurse 0 Yes Crutches/cane/walker 0 No Furniture 0 No Intravenous therapy Access/Saline/Heparin Lock 0 No Gait/Transferring Normal/ bed rest/ wheelchair 0 Yes Weak (short steps with  or without shuffle, stooped but able to lift head while walking, may seek 0 No support from furniture) Impaired (short steps with shuffle, may have difficulty arising from chair, head down, impaired 0 No balance) Mental Status Oriented to own ability 0 Yes Electronic Signature(s) Signed: 03/23/2020 5:19:05 PM By: Zenaida Deed RN, BSN Entered By: Zenaida Deed on 03/23/2020 11:15:42 -------------------------------------------------------------------------------- Foot Assessment Details Patient Name: Date of Service: [name] Rhine. 03/23/2020 10:30 A M Medical Record Number: [id number] Patient Account Number: [id number] Date of Birth/Sex: Treating RN: [birth year] (59 y.o. [name] Primary Care Johnpaul Gillentine: [name] Other Clinician: Referring Dayzee Trower: Treating Amelia Macken/Extender: Reggy Eye in Treatment: 0 Foot Assessment Items Site Locations + = Sensation present, - = Sensation absent, C = Callus, U = Ulcer R = Redness, W = Warmth, M = Maceration, PU = Pre-ulcerative lesion F = Fissure, S = Swelling, D = Dryness Assessment Right: Left: Other Deformity: Yes Yes Prior Foot Ulcer: Yes Yes Prior Amputation: No No Charcot Joint: No No Ambulatory Status:  Non-ambulatory Assistance Device: Wheelchair Gait: Electronic Signature(s) Signed: 03/23/2020 5:19:05 PM By: Zenaida Deed RN, BSN Entered By: Zenaida Deed on 03/23/2020 11:24:54 -------------------------------------------------------------------------------- Nutrition Risk Screening Details Patient Name: Date of Service: [name], [name] 03/23/2020 10:30 A M Medical Record Number: [id number] Patient Account Number: [id number] Date of Birth/Sex: Treating RN: 02-20-1961 (60 y.o. [name] Primary Care Yerania Chamorro: [name] Other Clinician: Referring Makayla Confer: Treating Alyaan Budzynski/Extender: Reggy Eye in Treatment: 0 Height (in): Weight (lbs): Body Mass Index (BMI): Nutrition Risk Screening Items Score Screening NUTRITION RISK SCREEN: I have an illness or condition that made me change the kind and/or amount of food I eat 2 Yes I eat fewer than two meals per day 0 No I eat few fruits and vegetables, or milk products 0 No I have three or more drinks of beer, liquor or wine almost every day 0 No I have tooth or mouth problems that make it hard for me to eat 0 No I don't always have enough money to buy the food I need 0 No I eat alone most of the time 0 No I take three or more different prescribed or over-the-counter drugs a day 0 No Without wanting to, I have lost or gained 10 pounds in the last six months 0 No I am not always physically able to shop, cook and/or feed myself 0 No Nutrition Protocols Good Risk Protocol 0 No interventions needed Moderate Risk Protocol High Risk Proctocol Risk Level: Good Risk Score: 2 Electronic Signature(s) Signed: 03/23/2020 5:19:05 PM By: Zenaida Deed RN, BSN Entered By: Zenaida Deed on 03/23/2020 11:16:13

## 2020-03-26 NOTE — Progress Notes (Signed)
  Subjective:  Patient ID: [name], male    DOB: 1961/01/04,  MRN: [id number]  Chief Complaint  Patient presents with  . Wound Check    Bilateral feet. 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 male presents with the above complaint. History confirmed with patient.   Objective:  Physical Exam: no tenderness at the surgical site, local edema noted and calf supple, nontender.  Incision: healing well, wound measuring 3x1.5. Additional pressure injuries medial ankle and lateral foot anterior to the original wound. Only with mild redness. Left leg edema mild cellulitis  Assessment:   1. Ulcerated, foot, right, with fat layer exposed (HCC)   2. Venous ulcer of ankle, left (HCC)    Plan:  Patient was evaluated and treated and all questions answered.  Post-operative State -Wound cleansed, no indication for debridement today.  He continues to have pressure ulcerations. He is awaiting his appointment at the wound care center. Continue Santyl wet-to-dry open areas. Unna boot applied for reduction of edema  No follow-ups on file.

## 2020-03-31 ENCOUNTER — Other Ambulatory Visit: Payer: Self-pay

## 2020-03-31 ENCOUNTER — Encounter (HOSPITAL_BASED_OUTPATIENT_CLINIC_OR_DEPARTMENT_OTHER): Payer: Medicare Other | Attending: Internal Medicine | Admitting: Internal Medicine

## 2020-03-31 DIAGNOSIS — G809 Cerebral palsy, unspecified: Secondary | ICD-10-CM | POA: Diagnosis not present

## 2020-03-31 DIAGNOSIS — Z8616 Personal history of COVID-19: Secondary | ICD-10-CM | POA: Insufficient documentation

## 2020-03-31 DIAGNOSIS — Y835 Amputation of limb(s) as the cause of abnormal reaction of the patient, or of later complication, without mention of misadventure at the time of the procedure: Secondary | ICD-10-CM | POA: Insufficient documentation

## 2020-03-31 DIAGNOSIS — T8781 Dehiscence of amputation stump: Secondary | ICD-10-CM | POA: Insufficient documentation

## 2020-03-31 DIAGNOSIS — S90811A Abrasion, right foot, initial encounter: Secondary | ICD-10-CM | POA: Insufficient documentation

## 2020-03-31 DIAGNOSIS — L97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 layer exposed: Secondary | ICD-10-CM | POA: Insufficient documentation

## 2020-03-31 DIAGNOSIS — I872 Venous insufficiency (chronic) (peripheral): Secondary | ICD-10-CM | POA: Diagnosis not present

## 2020-03-31 DIAGNOSIS — I890 Lymphedema, not elsewhere classified: Secondary | ICD-10-CM | POA: Insufficient documentation

## 2020-03-31 DIAGNOSIS — R532 Functional quadriplegia: Secondary | ICD-10-CM | POA: Diagnosis not present

## 2020-03-31 DIAGNOSIS — L97312 Non-pressure chronic ulcer of right 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: Secondary | ICD-10-CM | POA: Insufficient documentation

## 2020-03-31 DIAGNOSIS — L97322 Non-pressure chronic ulcer of left 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: Secondary | ICD-10-CM | POA: Diagnosis not present

## 2020-03-31 DIAGNOSIS — L03115 Cellulitis of right lower limb: Secondary | ICD-10-CM | POA: Insufficient documentation

## 2020-03-31 DIAGNOSIS — W228XXA Striking against or struck by other objects, initial encounter: Secondary | ICD-10-CM | POA: Diagnosis not present

## 2020-04-03 NOTE — Progress Notes (Signed)
Pedis Palpable: [Left:Yes] [Right:Yes] Electronic Signature(s) Signed: 04/03/2020 6:16:40 PM By: [name] RN, BSN Entered By: [name] on 03/31/2020 09:36:04 -------------------------------------------------------------------------------- Multi Wound Chart Details Patient Name: Date of Service: [name]. 03/31/2020 8:45 A M Medical Record Number: [id number] Patient Account Number: [id number] Date of Birth/Sex: Treating RN: 02-23-1961 (59 y.o. [name] Primary Care Graciella Arment: [name] Other Clinician: Referring Stacye Noori: Treating Harvel Meskill/Extender: [name], [name] in Treatment: 1 Vital Signs Height(in): Pulse(bpm): 84 Weight(lbs): Blood Pressure(mmHg): 154/89 Body Mass Index(BMI): Temperature(F): 97.5 Respiratory Rate(breaths/min): 18 Photos: [40:No Photos Left, Medial Lower Leg] [41:No Photos Right T Fourth oe] [42:No Photos Right, Medial Malleolus] Wound Location: [40:Gradually Appeared] [41:Trauma] [42:Gradually  Appeared] Wounding Event: [40:Venous Leg Ulcer] [41:Lymphedema] [42:Venous Leg Ulcer] Primary Etiology: [40:N/A] [41:N/A] [42:N/A] Secondary Etiology: [40:Anemia, Deep Vein Thrombosis,] [41:Anemia, Deep Vein Thrombosis,] [42:Anemia, Deep Vein Thrombosis,] Comorbid History: [40:Colitis, Osteomyelitis, Neuropathy, Colitis, Osteomyelitis, Neuropathy, Colitis, Osteomyelitis, Neuropathy, Quadriplegia, Confinement Anxiety 02/21/2020] [41:Quadriplegia, Confinement Anxiety 02/21/2020] [42:Quadriplegia, Confinement  Anxiety 02/21/2020] Date Acquired: [40:1] [41:1] [42:1] Weeks of Treatment: [40:Open] [41:Open] [42:Open] Wound Status: [40:No] [41:No] [42:Yes] Clustered Wound: [40:N/A] [41:N/A] [42:2] Clustered Quantity: [40:4x5.5x0.1] [41:1.2x0.6x0.1] [42:1.5x3x0.1] Measurements L x W x D (cm) [40:17.279] [41:0.565] [42:3.534] A (cm) : rea [40:1.728] [41:0.057] [42:0.353] Volume (cm) : [40:19.90%] [41:60.00%] [42:29.70%] % Reduction in Area: [40:19.90%] [41:59.60%] [42:64.90%] % Reduction in Volume: [40:Full Thickness Without Exposed] [41:Full Thickness Without Exposed] [42:Full Thickness Without Exposed] Classification: [40:Support Structures Large] [41:Support Structures Medium] [42:Support Structures Medium] Exudate A mount: [40:Serous] [41:Serosanguineous] [42:Serosanguineous] Exudate Type: [40:amber] [41:red, brown] [42:red, brown] Exudate Color: [40:Flat and Intact] [41:Flat and Intact] [42:Flat and Intact] Wound Margin: [40:Medium (34-66%)] [41:Small (1-33%)] [42:Medium (34-66%)] Granulation Amount: [40:Red] [41:Pink] [42:Red] Granulation Quality: [40:Medium (34-66%)] [41:Large (67-100%)] [42:Medium (34-66%)] Necrotic Amount: [40:Adherent Slough] [41:Adherent Slough] [42:Adherent Slough] Necrotic Tissue: [40:Fat Layer (Subcutaneous Tissue): Yes Fat Layer (Subcutaneous Tissue): Yes Fat Layer (Subcutaneous Tissue): Yes] Exposed Structures: [40:Fascia: No Tendon: No Muscle: No Joint: No Bone: No  Small (1-33%)] [41:Fascia: No Tendon: No Muscle: No Joint: No Bone: No None] [42:Fascia: No Tendon: No Muscle: No Joint: No Bone: No Small (1-33%)] Epithelialization: [40:Debridement - Excisional] [41:N/A] [42:Debridement - Excisional] Debridement: [40:09:50] [41:N/A] [42:09:50] Pre-procedure Verification/Time Out Taken: [40:Other] [41:N/A] [42:Other] Pain Control: [40:Subcutaneous, Slough] [41:N/A] [42:Subcutaneous, Slough] Tissue Debrided: [40:Skin/Subcutaneous Tissue] [41:N/A] [42:Skin/Subcutaneous Tissue] Level: [40:22] [41:N/A] [42:4.5] Debridement A (sq cm): [40:rea Curette] [41:N/A] [42:Curette] Instrument: [40:Minimum] [41:N/A] [42:Minimum] Bleeding: [40:Pressure] [41:N/A] [42:Pressure] Hemostasis A chieved: [40:5] [41:N/A] [42:5] Procedural Pain: [40:2] [41:N/A] [42:2] Post Procedural Pain: [40:Procedure was tolerated well] [41:N/A] [42:Procedure was tolerated well] Debridement Treatment Response: [40:4x5.5x0.1] [41:N/A] [42:1.5x3x0.1] Post Debridement Measurements L x W x D (cm) [40:1.728] [41:N/A] [42:0.353] Post Debridement Volume: (cm) [40:N/A] [41:N/A] [42:N/A] Post Debridement Stage: [40:N/A] [41:N/A] [42:N/A] Wound Number: [id number] Photos: No Photos No Photos No Photos Right, Distal, Anterior Lower Leg Right, Proximal, Lateral Foot Right, Distal, Lateral Foot Wound Location: Pressure Injury Pressure Injury Pressure Injury Wounding Event: Pressure Ulcer Pressure Ulcer Pressure Ulcer Primary Etiology: Venous Leg Ulcer N/A N/A Secondary Etiology: Anemia, Deep Vein Thrombosis, Anemia, Deep Vein Thrombosis, Anemia, Deep Vein Thrombosis, Comorbid History: Colitis, Osteomyelitis, Neuropathy, Colitis, Osteomyelitis, Neuropathy, Colitis, Osteomyelitis, Neuropathy, Quadriplegia, Confinement Anxiety Quadriplegia, Confinement Anxiety Quadriplegia, Confinement Anxiety 02/21/2020 02/21/2020 02/21/2020 Date Acquired: 1 1 1  Weeks of Treatment: Open Open Open Wound Status: Yes No  No Clustered Wound: 2 N/A N/A Clustered Quantity: 0.3x0.4x0.1 3x1.8x0.1 1.8x1.7x0.1 Measurements L x W x D (cm) 0.094 4.241 2.403 A (cm) : rea 0.009 0.424 0.24 Volume (cm) :  Pedis Palpable: [Left:Yes] [Right:Yes] Electronic Signature(s) Signed: 04/03/2020 6:16:40 PM By: [name] RN, BSN Entered By: [name] on 03/31/2020 09:36:04 -------------------------------------------------------------------------------- Multi Wound Chart Details Patient Name: Date of Service: [name]. 03/31/2020 8:45 A M Medical Record Number: [id number] Patient Account Number: [id number] Date of Birth/Sex: Treating RN: 02-23-1961 (59 y.o. [name] Primary Care Graciella Arment: [name] Other Clinician: Referring Stacye Noori: Treating Harvel Meskill/Extender: [name], [name] in Treatment: 1 Vital Signs Height(in): Pulse(bpm): 84 Weight(lbs): Blood Pressure(mmHg): 154/89 Body Mass Index(BMI): Temperature(F): 97.5 Respiratory Rate(breaths/min): 18 Photos: [40:No Photos Left, Medial Lower Leg] [41:No Photos Right T Fourth oe] [42:No Photos Right, Medial Malleolus] Wound Location: [40:Gradually Appeared] [41:Trauma] [42:Gradually  Appeared] Wounding Event: [40:Venous Leg Ulcer] [41:Lymphedema] [42:Venous Leg Ulcer] Primary Etiology: [40:N/A] [41:N/A] [42:N/A] Secondary Etiology: [40:Anemia, Deep Vein Thrombosis,] [41:Anemia, Deep Vein Thrombosis,] [42:Anemia, Deep Vein Thrombosis,] Comorbid History: [40:Colitis, Osteomyelitis, Neuropathy, Colitis, Osteomyelitis, Neuropathy, Colitis, Osteomyelitis, Neuropathy, Quadriplegia, Confinement Anxiety 02/21/2020] [41:Quadriplegia, Confinement Anxiety 02/21/2020] [42:Quadriplegia, Confinement  Anxiety 02/21/2020] Date Acquired: [40:1] [41:1] [42:1] Weeks of Treatment: [40:Open] [41:Open] [42:Open] Wound Status: [40:No] [41:No] [42:Yes] Clustered Wound: [40:N/A] [41:N/A] [42:2] Clustered Quantity: [40:4x5.5x0.1] [41:1.2x0.6x0.1] [42:1.5x3x0.1] Measurements L x W x D (cm) [40:17.279] [41:0.565] [42:3.534] A (cm) : rea [40:1.728] [41:0.057] [42:0.353] Volume (cm) : [40:19.90%] [41:60.00%] [42:29.70%] % Reduction in Area: [40:19.90%] [41:59.60%] [42:64.90%] % Reduction in Volume: [40:Full Thickness Without Exposed] [41:Full Thickness Without Exposed] [42:Full Thickness Without Exposed] Classification: [40:Support Structures Large] [41:Support Structures Medium] [42:Support Structures Medium] Exudate A mount: [40:Serous] [41:Serosanguineous] [42:Serosanguineous] Exudate Type: [40:amber] [41:red, brown] [42:red, brown] Exudate Color: [40:Flat and Intact] [41:Flat and Intact] [42:Flat and Intact] Wound Margin: [40:Medium (34-66%)] [41:Small (1-33%)] [42:Medium (34-66%)] Granulation Amount: [40:Red] [41:Pink] [42:Red] Granulation Quality: [40:Medium (34-66%)] [41:Large (67-100%)] [42:Medium (34-66%)] Necrotic Amount: [40:Adherent Slough] [41:Adherent Slough] [42:Adherent Slough] Necrotic Tissue: [40:Fat Layer (Subcutaneous Tissue): Yes Fat Layer (Subcutaneous Tissue): Yes Fat Layer (Subcutaneous Tissue): Yes] Exposed Structures: [40:Fascia: No Tendon: No Muscle: No Joint: No Bone: No  Small (1-33%)] [41:Fascia: No Tendon: No Muscle: No Joint: No Bone: No None] [42:Fascia: No Tendon: No Muscle: No Joint: No Bone: No Small (1-33%)] Epithelialization: [40:Debridement - Excisional] [41:N/A] [42:Debridement - Excisional] Debridement: [40:09:50] [41:N/A] [42:09:50] Pre-procedure Verification/Time Out Taken: [40:Other] [41:N/A] [42:Other] Pain Control: [40:Subcutaneous, Slough] [41:N/A] [42:Subcutaneous, Slough] Tissue Debrided: [40:Skin/Subcutaneous Tissue] [41:N/A] [42:Skin/Subcutaneous Tissue] Level: [40:22] [41:N/A] [42:4.5] Debridement A (sq cm): [40:rea Curette] [41:N/A] [42:Curette] Instrument: [40:Minimum] [41:N/A] [42:Minimum] Bleeding: [40:Pressure] [41:N/A] [42:Pressure] Hemostasis A chieved: [40:5] [41:N/A] [42:5] Procedural Pain: [40:2] [41:N/A] [42:2] Post Procedural Pain: [40:Procedure was tolerated well] [41:N/A] [42:Procedure was tolerated well] Debridement Treatment Response: [40:4x5.5x0.1] [41:N/A] [42:1.5x3x0.1] Post Debridement Measurements L x W x D (cm) [40:1.728] [41:N/A] [42:0.353] Post Debridement Volume: (cm) [40:N/A] [41:N/A] [42:N/A] Post Debridement Stage: [40:N/A] [41:N/A] [42:N/A] Wound Number: [id number] Photos: No Photos No Photos No Photos Right, Distal, Anterior Lower Leg Right, Proximal, Lateral Foot Right, Distal, Lateral Foot Wound Location: Pressure Injury Pressure Injury Pressure Injury Wounding Event: Pressure Ulcer Pressure Ulcer Pressure Ulcer Primary Etiology: Venous Leg Ulcer N/A N/A Secondary Etiology: Anemia, Deep Vein Thrombosis, Anemia, Deep Vein Thrombosis, Anemia, Deep Vein Thrombosis, Comorbid History: Colitis, Osteomyelitis, Neuropathy, Colitis, Osteomyelitis, Neuropathy, Colitis, Osteomyelitis, Neuropathy, Quadriplegia, Confinement Anxiety Quadriplegia, Confinement Anxiety Quadriplegia, Confinement Anxiety 02/21/2020 02/21/2020 02/21/2020 Date Acquired: 1 1 1  Weeks of Treatment: Open Open Open Wound Status: Yes No  No Clustered Wound: 2 N/A N/A Clustered Quantity: 0.3x0.4x0.1 3x1.8x0.1 1.8x1.7x0.1 Measurements L x W x D (cm) 0.094 4.241 2.403 A (cm) : rea 0.009 0.424 0.24 Volume (cm) :  Pedis Palpable: [Left:Yes] [Right:Yes] Electronic Signature(s) Signed: 04/03/2020 6:16:40 PM By: [name] RN, BSN Entered By: [name] on 03/31/2020 09:36:04 -------------------------------------------------------------------------------- Multi Wound Chart Details Patient Name: Date of Service: [name]. 03/31/2020 8:45 A M Medical Record Number: [id number] Patient Account Number: [id number] Date of Birth/Sex: Treating RN: 02-23-1961 (59 y.o. [name] Primary Care Graciella Arment: [name] Other Clinician: Referring Stacye Noori: Treating Harvel Meskill/Extender: [name], [name] in Treatment: 1 Vital Signs Height(in): Pulse(bpm): 84 Weight(lbs): Blood Pressure(mmHg): 154/89 Body Mass Index(BMI): Temperature(F): 97.5 Respiratory Rate(breaths/min): 18 Photos: [40:No Photos Left, Medial Lower Leg] [41:No Photos Right T Fourth oe] [42:No Photos Right, Medial Malleolus] Wound Location: [40:Gradually Appeared] [41:Trauma] [42:Gradually  Appeared] Wounding Event: [40:Venous Leg Ulcer] [41:Lymphedema] [42:Venous Leg Ulcer] Primary Etiology: [40:N/A] [41:N/A] [42:N/A] Secondary Etiology: [40:Anemia, Deep Vein Thrombosis,] [41:Anemia, Deep Vein Thrombosis,] [42:Anemia, Deep Vein Thrombosis,] Comorbid History: [40:Colitis, Osteomyelitis, Neuropathy, Colitis, Osteomyelitis, Neuropathy, Colitis, Osteomyelitis, Neuropathy, Quadriplegia, Confinement Anxiety 02/21/2020] [41:Quadriplegia, Confinement Anxiety 02/21/2020] [42:Quadriplegia, Confinement  Anxiety 02/21/2020] Date Acquired: [40:1] [41:1] [42:1] Weeks of Treatment: [40:Open] [41:Open] [42:Open] Wound Status: [40:No] [41:No] [42:Yes] Clustered Wound: [40:N/A] [41:N/A] [42:2] Clustered Quantity: [40:4x5.5x0.1] [41:1.2x0.6x0.1] [42:1.5x3x0.1] Measurements L x W x D (cm) [40:17.279] [41:0.565] [42:3.534] A (cm) : rea [40:1.728] [41:0.057] [42:0.353] Volume (cm) : [40:19.90%] [41:60.00%] [42:29.70%] % Reduction in Area: [40:19.90%] [41:59.60%] [42:64.90%] % Reduction in Volume: [40:Full Thickness Without Exposed] [41:Full Thickness Without Exposed] [42:Full Thickness Without Exposed] Classification: [40:Support Structures Large] [41:Support Structures Medium] [42:Support Structures Medium] Exudate A mount: [40:Serous] [41:Serosanguineous] [42:Serosanguineous] Exudate Type: [40:amber] [41:red, brown] [42:red, brown] Exudate Color: [40:Flat and Intact] [41:Flat and Intact] [42:Flat and Intact] Wound Margin: [40:Medium (34-66%)] [41:Small (1-33%)] [42:Medium (34-66%)] Granulation Amount: [40:Red] [41:Pink] [42:Red] Granulation Quality: [40:Medium (34-66%)] [41:Large (67-100%)] [42:Medium (34-66%)] Necrotic Amount: [40:Adherent Slough] [41:Adherent Slough] [42:Adherent Slough] Necrotic Tissue: [40:Fat Layer (Subcutaneous Tissue): Yes Fat Layer (Subcutaneous Tissue): Yes Fat Layer (Subcutaneous Tissue): Yes] Exposed Structures: [40:Fascia: No Tendon: No Muscle: No Joint: No Bone: No  Small (1-33%)] [41:Fascia: No Tendon: No Muscle: No Joint: No Bone: No None] [42:Fascia: No Tendon: No Muscle: No Joint: No Bone: No Small (1-33%)] Epithelialization: [40:Debridement - Excisional] [41:N/A] [42:Debridement - Excisional] Debridement: [40:09:50] [41:N/A] [42:09:50] Pre-procedure Verification/Time Out Taken: [40:Other] [41:N/A] [42:Other] Pain Control: [40:Subcutaneous, Slough] [41:N/A] [42:Subcutaneous, Slough] Tissue Debrided: [40:Skin/Subcutaneous Tissue] [41:N/A] [42:Skin/Subcutaneous Tissue] Level: [40:22] [41:N/A] [42:4.5] Debridement A (sq cm): [40:rea Curette] [41:N/A] [42:Curette] Instrument: [40:Minimum] [41:N/A] [42:Minimum] Bleeding: [40:Pressure] [41:N/A] [42:Pressure] Hemostasis A chieved: [40:5] [41:N/A] [42:5] Procedural Pain: [40:2] [41:N/A] [42:2] Post Procedural Pain: [40:Procedure was tolerated well] [41:N/A] [42:Procedure was tolerated well] Debridement Treatment Response: [40:4x5.5x0.1] [41:N/A] [42:1.5x3x0.1] Post Debridement Measurements L x W x D (cm) [40:1.728] [41:N/A] [42:0.353] Post Debridement Volume: (cm) [40:N/A] [41:N/A] [42:N/A] Post Debridement Stage: [40:N/A] [41:N/A] [42:N/A] Wound Number: [id number] Photos: No Photos No Photos No Photos Right, Distal, Anterior Lower Leg Right, Proximal, Lateral Foot Right, Distal, Lateral Foot Wound Location: Pressure Injury Pressure Injury Pressure Injury Wounding Event: Pressure Ulcer Pressure Ulcer Pressure Ulcer Primary Etiology: Venous Leg Ulcer N/A N/A Secondary Etiology: Anemia, Deep Vein Thrombosis, Anemia, Deep Vein Thrombosis, Anemia, Deep Vein Thrombosis, Comorbid History: Colitis, Osteomyelitis, Neuropathy, Colitis, Osteomyelitis, Neuropathy, Colitis, Osteomyelitis, Neuropathy, Quadriplegia, Confinement Anxiety Quadriplegia, Confinement Anxiety Quadriplegia, Confinement Anxiety 02/21/2020 02/21/2020 02/21/2020 Date Acquired: 1 1 1  Weeks of Treatment: Open Open Open Wound Status: Yes No  No Clustered Wound: 2 N/A N/A Clustered Quantity: 0.3x0.4x0.1 3x1.8x0.1 1.8x1.7x0.1 Measurements L x W x D (cm) 0.094 4.241 2.403 A (cm) : rea 0.009 0.424 0.24 Volume (cm) :  DAYNE, CHAIT ([id number]) Visit Report for 03/31/2020 Arrival Information Details Patient Name: Date of Service: JADIEN, LEHIGH 03/31/2020 8:45 A M Medical Record Number: [id number] Patient Account Number: [id number] Date of Birth/Sex: Treating RN: April 23, 1961 (59 y.o. [name] Primary Care Roshun Klingensmith: [name] Other Clinician: Referring Parris Signer: Treating Yael Coppess/Extender: [name], [name] in Treatment: 1 Visit Information History Since Last Visit Added or deleted any medications: No Patient Arrived: Wheel Chair Any new allergies or adverse reactions: No Arrival Time: 09:10 Had a fall or experienced change in No Accompanied By: craegiver activities of daily living that may affect Transfer Assistance: None risk of falls: Patient Identification Verified: Yes Signs or symptoms of abuse/neglect since last visito No Secondary Verification Process Completed: Yes Hospitalized since last visit: No Patient Requires Transmission-Based Precautions: No Implantable device outside of the clinic excluding No Patient Has Alerts: No cellular tissue based products placed in the center since last visit: Has Dressing in Place as Prescribed: Yes Pain Present Now: No Electronic Signature(s) Signed: 03/31/2020 2:44:54 PM By: Karl Ito Entered By: Karl Ito on 03/31/2020 09:11:18 -------------------------------------------------------------------------------- Compression Therapy Details Patient Name: Date of Service: ZACKERIAH, KISSLER 03/31/2020 8:45 A M Medical Record Number: [id number] Patient Account Number: [id number] Date of Birth/Sex: Treating RN: [birth year] (59 y.o. [name] Primary Care Jacelyn Cuen: [name] Other Clinician: Referring Kamar Callender: Treating Gaile Allmon/Extender: [name], [name] in Treatment: 1 Compression Therapy Performed for Wound Assessment: Wound #42 Right,Medial  Malleolus Performed By: Clinician Shawn Stall, RN Compression Type: Three Layer Post Procedure Diagnosis Same as Pre-procedure Electronic Signature(s) Signed: 03/31/2020 6:06:29 PM By: Zenaida Deed RN, BSN Entered By: Zenaida Deed on 03/31/2020 09:58:05 -------------------------------------------------------------------------------- Compression Therapy Details Patient Name: Date of Service: NICKALAUS, CROOKE 03/31/2020 8:45 A M Medical Record Number: [id number] Patient Account Number: [id number] Date of Birth/Sex: Treating RN: 1960-10-13 (59 y.o. [name] Primary Care Anibal Quinby: [name] Other Clinician: Referring Medea Deines: Treating Alphonza Tramell/Extender: [name], [name] in Treatment: 1 Compression Therapy Performed for Wound Assessment: Wound #40 Left,Medial Lower Leg Performed By: Clinician Shawn Stall, RN Compression Type: Three Layer Post Procedure Diagnosis Same as Pre-procedure Electronic Signature(s) Signed: 03/31/2020 6:06:29 PM By: Zenaida Deed RN, BSN Entered By: Zenaida Deed on 03/31/2020 09:58:05 -------------------------------------------------------------------------------- Encounter Discharge Information Details Patient Name: Date of Service: JOVANNE, RIGGENBACH 03/31/2020 8:45 A M Medical Record Number: [id number] Patient Account Number: [id number] Date of Birth/Sex: Treating RN: January 22, 1961 (59 y.o. [name] Primary Care Shaunika Italiano: [name] Other Clinician: Referring Hailly Fess: Treating Venissa Nappi/Extender: [name], [name] in Treatment: 1 Encounter Discharge Information Items Post Procedure Vitals Discharge Condition: Stable Temperature (F): 97.5 Ambulatory Status: Wheelchair Pulse (bpm): 84 Discharge Destination: Home Respiratory Rate (breaths/min): 18 Transportation: Private Auto Blood Pressure (mmHg): 154/89 Accompanied By: caregiver Schedule Follow-up Appointment:  Yes Clinical Summary of Care: Electronic Signature(s) Signed: 03/31/2020 5:41:13 PM By: Shawn Stall Entered By: Shawn Stall on 03/31/2020 10:52:14 -------------------------------------------------------------------------------- Lower Extremity Assessment Details Patient Name: Date of Service: KAREE, CHRISTOPHERSON 03/31/2020 8:45 A M Medical Record Number: [id number] Patient Account Number: [id number] Date of Birth/Sex: Treating RN: 04/21/1961 (59 y.o. [name] Primary Care Krystan Northrop: [name] Other Clinician: Referring Skiler Tye: Treating Shylee Durrett/Extender: [name], [name] in Treatment: 1 Edema Assessment Assessed: [Left: No] [Right: No] Edema: [Left: Yes] [Right: Yes] Calf Left: Right: Point of Measurement: From Medial Instep 27.8 cm 26 cm Ankle Left: Right: Point of Measurement: From Medial Instep 21 cm 24.5 cm Vascular Assessment Pulses: Dorsalis  AG Other primary dressing (specifiy in notes) 4. Secondary Dressing ABD Pad Dry Gauze 6. Support Layer Applied 3 layer compression wrap Notes saline moisten gauze over collagen. netting. Electronic Signature(s) Signed: 03/31/2020 6:06:29 PM By: Zenaida Deed RN, BSN Signed: 04/03/2020 6:16:40 PM By:  [name] RN, BSN Entered By: [name] on 03/31/2020 09:36:44 -------------------------------------------------------------------------------- Wound Assessment Details Patient Name: Date of Service: MATTOX, SCHORR 03/31/2020 8:45 A M Medical Record Number: [id number] Patient Account Number: [id number] Date of Birth/Sex: Treating RN: July 29, 1960 (59 y.o. [name] Primary Care Galilee Pierron: [name] Other Clinician: Referring Sabryn Preslar: Treating Delante Karapetyan/Extender: [name], [name] in Treatment: 1 Wound Status Wound Number: [id number] Primary Lymphedema Etiology: Wound Location: Right T Fourth oe Wound Open Wounding Event: Trauma Status: Date Acquired: 02/21/2020 Comorbid Anemia, Deep Vein Thrombosis, Colitis, Osteomyelitis, Weeks Of Treatment: 1 History: Neuropathy, Quadriplegia, Confinement Anxiety Clustered Wound: No Wound Measurements Length: (cm) 1.2 Width: (cm) 0.6 Depth: (cm) 0.1 Area: (cm) 0.565 Volume: (cm) 0.057 % Reduction in Area: 60% % Reduction in Volume: 59.6% Epithelialization: None Tunneling: No Undermining: No Wound Description Classification: Full Thickness Without Exposed Support Structures Wound Margin: Flat and Intact Exudate Amount: Medium Exudate Type: Serosanguineous Exudate Color: red, brown Foul Odor After Cleansing: No Slough/Fibrino Yes Wound Bed Granulation Amount: Small (1-33%) Exposed Structure Granulation Quality: Pink Fascia Exposed: No Necrotic Amount: Large (67-100%) Fat Layer (Subcutaneous Tissue) Exposed: Yes Necrotic Quality: Adherent Slough Tendon Exposed: No Muscle Exposed: No Joint Exposed: No Bone Exposed: No Treatment Notes Wound #41 (Right Toe Fourth) 1. Cleanse With Wound Cleanser 3. Primary Dressing Applied Colle[employer] Other primary dressing (specifiy in notes) 4. Secondary Dressing Dry Gauze Roll Gauze 5. Secured With Medipore tape Notes saline moisten gauze over  collagen. Electronic Signature(s) Signed: 03/31/2020 6:06:29 PM By: Zenaida Deed RN, BSN Signed: 04/03/2020 6:16:40 PM By: [name] RN, BSN Entered By: [name] on 03/31/2020 09:37:01 -------------------------------------------------------------------------------- Wound Assessment Details Patient Name: Date of Service: ZAKRY, CASO 03/31/2020 8:45 A M Medical Record Number: [id number] Patient Account Number: [id number] Date of Birth/Sex: Treating RN: 1961-01-21 (59 y.o. [name] Primary Care Darleny Sem: [name] Other Clinician: Referring Navdeep Halt: Treating Mayra Jolliffe/Extender: [name], [name] in Treatment: 1 Wound Status Wound Number: 42 Primary Venous Leg Ulcer Etiology: Wound Location: Right, Medial Malleolus Wound Open Wounding Event: Gradually Appeared Status: Date Acquired: 02/21/2020 Comorbid Anemia, Deep Vein Thrombosis, Colitis, Osteomyelitis, Weeks Of Treatment: 1 History: Neuropathy, Quadriplegia, Confinement Anxiety Clustered Wound: Yes Wound Measurements Length: (cm) 1.5 Width: (cm) 3 Depth: (cm) 0.1 Clustered Quantity: 2 Area: (cm) 3.534 Volume: (cm) 0.353 % Reduction in Area: 29.7% % Reduction in Volume: 64.9% Epithelialization: Small (1-33%) Tunneling: No Undermining: No Wound Description Classification: Full Thickness Without Exposed Support Structures Wound Margin: Flat and Intact Exudate Amount: Medium Exudate Type: Serosanguineous Exudate Color: red, brown Foul Odor After Cleansing: No Slough/Fibrino Yes Wound Bed Granulation Amount: Medium (34-66%) Exposed Structure Granulation Quality: Red Fascia Exposed: No Necrotic Amount: Medium (34-66%) Fat Layer (Subcutaneous Tissue) Exposed: Yes Necrotic Quality: Adherent Slough Tendon Exposed: No Muscle Exposed: No Joint Exposed: No Bone Exposed: No Treatment Notes Wound #[id number] (Right, Medial Malleolus) 1. Cleanse With Wound Cleanser Soap  and water 2. Periwound Care Moisturizing lotion 3. Primary Dressing Applied Collegen [employer] Other primary dressing (specifiy in notes) 4. Secondary Dressing ABD Pad Dry Gauze 6. Support Layer Applied 3 layer compression wrap Notes saline moisten gauze over collagen. netting. Electronic Signature(s) Signed: 03/31/2020 6:06:29 PM By: Zenaida Deed RN, BSN Signed:  DAYNE, CHAIT ([id number]) Visit Report for 03/31/2020 Arrival Information Details Patient Name: Date of Service: JADIEN, LEHIGH 03/31/2020 8:45 A M Medical Record Number: [id number] Patient Account Number: [id number] Date of Birth/Sex: Treating RN: April 23, 1961 (59 y.o. [name] Primary Care Roshun Klingensmith: [name] Other Clinician: Referring Parris Signer: Treating Yael Coppess/Extender: [name], [name] in Treatment: 1 Visit Information History Since Last Visit Added or deleted any medications: No Patient Arrived: Wheel Chair Any new allergies or adverse reactions: No Arrival Time: 09:10 Had a fall or experienced change in No Accompanied By: craegiver activities of daily living that may affect Transfer Assistance: None risk of falls: Patient Identification Verified: Yes Signs or symptoms of abuse/neglect since last visito No Secondary Verification Process Completed: Yes Hospitalized since last visit: No Patient Requires Transmission-Based Precautions: No Implantable device outside of the clinic excluding No Patient Has Alerts: No cellular tissue based products placed in the center since last visit: Has Dressing in Place as Prescribed: Yes Pain Present Now: No Electronic Signature(s) Signed: 03/31/2020 2:44:54 PM By: Karl Ito Entered By: Karl Ito on 03/31/2020 09:11:18 -------------------------------------------------------------------------------- Compression Therapy Details Patient Name: Date of Service: ZACKERIAH, KISSLER 03/31/2020 8:45 A M Medical Record Number: [id number] Patient Account Number: [id number] Date of Birth/Sex: Treating RN: [birth year] (59 y.o. [name] Primary Care Jacelyn Cuen: [name] Other Clinician: Referring Kamar Callender: Treating Gaile Allmon/Extender: [name], [name] in Treatment: 1 Compression Therapy Performed for Wound Assessment: Wound #42 Right,Medial  Malleolus Performed By: Clinician Shawn Stall, RN Compression Type: Three Layer Post Procedure Diagnosis Same as Pre-procedure Electronic Signature(s) Signed: 03/31/2020 6:06:29 PM By: Zenaida Deed RN, BSN Entered By: Zenaida Deed on 03/31/2020 09:58:05 -------------------------------------------------------------------------------- Compression Therapy Details Patient Name: Date of Service: NICKALAUS, CROOKE 03/31/2020 8:45 A M Medical Record Number: [id number] Patient Account Number: [id number] Date of Birth/Sex: Treating RN: 1960-10-13 (59 y.o. [name] Primary Care Anibal Quinby: [name] Other Clinician: Referring Medea Deines: Treating Alphonza Tramell/Extender: [name], [name] in Treatment: 1 Compression Therapy Performed for Wound Assessment: Wound #40 Left,Medial Lower Leg Performed By: Clinician Shawn Stall, RN Compression Type: Three Layer Post Procedure Diagnosis Same as Pre-procedure Electronic Signature(s) Signed: 03/31/2020 6:06:29 PM By: Zenaida Deed RN, BSN Entered By: Zenaida Deed on 03/31/2020 09:58:05 -------------------------------------------------------------------------------- Encounter Discharge Information Details Patient Name: Date of Service: JOVANNE, RIGGENBACH 03/31/2020 8:45 A M Medical Record Number: [id number] Patient Account Number: [id number] Date of Birth/Sex: Treating RN: January 22, 1961 (59 y.o. [name] Primary Care Shaunika Italiano: [name] Other Clinician: Referring Hailly Fess: Treating Venissa Nappi/Extender: [name], [name] in Treatment: 1 Encounter Discharge Information Items Post Procedure Vitals Discharge Condition: Stable Temperature (F): 97.5 Ambulatory Status: Wheelchair Pulse (bpm): 84 Discharge Destination: Home Respiratory Rate (breaths/min): 18 Transportation: Private Auto Blood Pressure (mmHg): 154/89 Accompanied By: caregiver Schedule Follow-up Appointment:  Yes Clinical Summary of Care: Electronic Signature(s) Signed: 03/31/2020 5:41:13 PM By: Shawn Stall Entered By: Shawn Stall on 03/31/2020 10:52:14 -------------------------------------------------------------------------------- Lower Extremity Assessment Details Patient Name: Date of Service: KAREE, CHRISTOPHERSON 03/31/2020 8:45 A M Medical Record Number: [id number] Patient Account Number: [id number] Date of Birth/Sex: Treating RN: 04/21/1961 (59 y.o. [name] Primary Care Krystan Northrop: [name] Other Clinician: Referring Skiler Tye: Treating Shylee Durrett/Extender: [name], [name] in Treatment: 1 Edema Assessment Assessed: [Left: No] [Right: No] Edema: [Left: Yes] [Right: Yes] Calf Left: Right: Point of Measurement: From Medial Instep 27.8 cm 26 cm Ankle Left: Right: Point of Measurement: From Medial Instep 21 cm 24.5 cm Vascular Assessment Pulses: Dorsalis  93.20% 43.80% 55.00% % Reduction in A rea: 96.70% 81.30% 55.10% % Reduction in Volume: Category/Stage III Unstageable/Unclassified Unstageable/Unclassified Classification: Medium Large Medium Exudate A mount: Serosanguineous Serosanguineous Serosanguineous Exudate Type: red, brown red, brown red, brown Exudate Color: Flat and Intact Flat and Intact Flat and Intact Wound Margin: Medium (34-66%) Medium (34-66%) None Present (0%) Granulation A mount: Red N/A N/A Granulation Quality: Medium (34-66%) Medium (34-66%) Large (67-100%) Necrotic A mount: Adherent Slough Adherent [employer], Adherent Slough Necrotic Tissue: Fat Layer (Subcutaneous Tissue): Yes Fat Layer (Subcutaneous Tissue): Yes Fat Layer (Subcutaneous Tissue): Yes Exposed Structures: Fascia: No Fascia: No Fascia: No Tendon: No Tendon: No Tendon: No Muscle: No Muscle: No Muscle: No Joint: No Joint: No Joint: No Bone: No Bone: No Bone: No Medium (34-66%) None None Epithelialization: N/A Debridement - Excisional Debridement - Excisional Debridement: Pre-procedure Verification/Time Out N/A 09:50 09:50 Taken: N/A Other Other Pain Control: N/A Subcutaneous, Slough Subcutaneous, Slough Tissue Debrided: N/A Skin/Subcutaneous Tissue Skin/Subcutaneous Tissue Level: N/A 5.4 3.06 Debridement A (sq cm): rea N/A Curette Curette Instrument: N/A Minimum Minimum Bleeding: N/A Pressure Pressure Hemostasis A chieved: N/A 5 5 Procedural Pain: N/A 2 2 Post Procedural Pain: N/A Procedure was tolerated well Procedure was tolerated well Debridement Treatment Response: N/A 3x1.8x0.1 1.8x1.7x0.1 Post Debridement Measurements L x W x D (cm) N/A 0.424 0.24 Post Debridement Volume: (cm) N/A Unstageable/Unclassified  Unstageable/Unclassified Post Debridement Stage: N/A Debridement Debridement Procedures Performed: Wound Number: 46 N/A N/A Photos: No Photos N/A N/A Right T Second oe N/A N/A Wound Location: Gradually Appeared N/A N/A Wounding Event: Pressure Ulcer N/A N/A Primary Etiology: N/A N/A N/A Secondary Etiology: Anemia, Deep Vein Thrombosis, N/A N/A Comorbid History: Colitis, Osteomyelitis, Neuropathy, Quadriplegia, Confinement Anxiety 03/31/2020 N/A N/A Date Acquired: 0 N/A N/A Weeks of Treatment: Open N/A N/A Wound Status: No N/A N/A Clustered Wound: N/A N/A N/A Clustered Quantity: 0.7x1.1x0.1 N/A N/A Measurements L x W x D (cm) 0.605 N/A N/A A (cm) : rea 0.06 N/A N/A Volume (cm) : 0.00% N/A N/A % Reduction in A rea: 0.00% N/A N/A % Reduction in Volume: Category/Stage III N/A N/A Classification: Medium N/A N/A Exudate A mount: Serosanguineous N/A N/A Exudate Type: red, brown N/A N/A Exudate Color: Flat and Intact N/A N/A Wound Margin: Medium (34-66%) N/A N/A Granulation A mount: Pink N/A N/A Granulation Quality: Medium (34-66%) N/A N/A Necrotic A mount: Adherent Slough N/A N/A Necrotic Tissue: Fat Layer (Subcutaneous Tissue): Yes N/A N/A Exposed Structures: Fascia: No Tendon: No Muscle: No Joint: No Bone: No None N/A N/A Epithelialization: N/A N/A N/A Debridement: N/A N/A N/A Pain Control: N/A N/A N/A Tissue Debrided: N/A N/A N/A Level: N/A N/A N/A Debridement A (sq cm): rea N/A N/A N/A Instrument: N/A N/A N/A Bleeding: N/A N/A N/A Hemostasis A chieved: N/A N/A N/A Procedural Pain: N/A N/A N/A Post Procedural Pain: Debridement Treatment Response: N/A N/A N/A Post Debridement Measurements L x N/A N/A N/A W x D (cm) N/A N/A N/A Post Debridement Volume: (cm) N/A N/A N/A Post Debridement Stage: N/A N/A N/A Procedures Performed: Treatment Notes Electronic Signature(s) Signed: 03/31/2020 6:06:29 PM By: Zenaida Deed RN,  BSN Signed: 04/03/2020 1:46:42 PM By: Baltazar Najjar MD Entered By: Baltazar Najjar on 03/31/2020 09:55:45 -------------------------------------------------------------------------------- Multi-Disciplinary Care Plan Details Patient Name: Date of Service: MACALLISTER, ASHMEAD 03/31/2020 8:45 A M Medical Record Number: [id number] Patient Account Number: [id number] Date of Birth/Sex: Treating RN: December 29, 1960 (59 y.o. [name] Primary Care Ysidra Sopher: [name] Other Clinician: Referring Arfa Lamarca: Treating Kinzley Savell/Extender: [name],  DAYNE, CHAIT ([id number]) Visit Report for 03/31/2020 Arrival Information Details Patient Name: Date of Service: JADIEN, LEHIGH 03/31/2020 8:45 A M Medical Record Number: [id number] Patient Account Number: [id number] Date of Birth/Sex: Treating RN: April 23, 1961 (59 y.o. [name] Primary Care Roshun Klingensmith: [name] Other Clinician: Referring Parris Signer: Treating Yael Coppess/Extender: [name], [name] in Treatment: 1 Visit Information History Since Last Visit Added or deleted any medications: No Patient Arrived: Wheel Chair Any new allergies or adverse reactions: No Arrival Time: 09:10 Had a fall or experienced change in No Accompanied By: craegiver activities of daily living that may affect Transfer Assistance: None risk of falls: Patient Identification Verified: Yes Signs or symptoms of abuse/neglect since last visito No Secondary Verification Process Completed: Yes Hospitalized since last visit: No Patient Requires Transmission-Based Precautions: No Implantable device outside of the clinic excluding No Patient Has Alerts: No cellular tissue based products placed in the center since last visit: Has Dressing in Place as Prescribed: Yes Pain Present Now: No Electronic Signature(s) Signed: 03/31/2020 2:44:54 PM By: Karl Ito Entered By: Karl Ito on 03/31/2020 09:11:18 -------------------------------------------------------------------------------- Compression Therapy Details Patient Name: Date of Service: ZACKERIAH, KISSLER 03/31/2020 8:45 A M Medical Record Number: [id number] Patient Account Number: [id number] Date of Birth/Sex: Treating RN: [birth year] (59 y.o. [name] Primary Care Jacelyn Cuen: [name] Other Clinician: Referring Kamar Callender: Treating Gaile Allmon/Extender: [name], [name] in Treatment: 1 Compression Therapy Performed for Wound Assessment: Wound #42 Right,Medial  Malleolus Performed By: Clinician Shawn Stall, RN Compression Type: Three Layer Post Procedure Diagnosis Same as Pre-procedure Electronic Signature(s) Signed: 03/31/2020 6:06:29 PM By: Zenaida Deed RN, BSN Entered By: Zenaida Deed on 03/31/2020 09:58:05 -------------------------------------------------------------------------------- Compression Therapy Details Patient Name: Date of Service: NICKALAUS, CROOKE 03/31/2020 8:45 A M Medical Record Number: [id number] Patient Account Number: [id number] Date of Birth/Sex: Treating RN: 1960-10-13 (59 y.o. [name] Primary Care Anibal Quinby: [name] Other Clinician: Referring Medea Deines: Treating Alphonza Tramell/Extender: [name], [name] in Treatment: 1 Compression Therapy Performed for Wound Assessment: Wound #40 Left,Medial Lower Leg Performed By: Clinician Shawn Stall, RN Compression Type: Three Layer Post Procedure Diagnosis Same as Pre-procedure Electronic Signature(s) Signed: 03/31/2020 6:06:29 PM By: Zenaida Deed RN, BSN Entered By: Zenaida Deed on 03/31/2020 09:58:05 -------------------------------------------------------------------------------- Encounter Discharge Information Details Patient Name: Date of Service: JOVANNE, RIGGENBACH 03/31/2020 8:45 A M Medical Record Number: [id number] Patient Account Number: [id number] Date of Birth/Sex: Treating RN: January 22, 1961 (59 y.o. [name] Primary Care Shaunika Italiano: [name] Other Clinician: Referring Hailly Fess: Treating Venissa Nappi/Extender: [name], [name] in Treatment: 1 Encounter Discharge Information Items Post Procedure Vitals Discharge Condition: Stable Temperature (F): 97.5 Ambulatory Status: Wheelchair Pulse (bpm): 84 Discharge Destination: Home Respiratory Rate (breaths/min): 18 Transportation: Private Auto Blood Pressure (mmHg): 154/89 Accompanied By: caregiver Schedule Follow-up Appointment:  Yes Clinical Summary of Care: Electronic Signature(s) Signed: 03/31/2020 5:41:13 PM By: Shawn Stall Entered By: Shawn Stall on 03/31/2020 10:52:14 -------------------------------------------------------------------------------- Lower Extremity Assessment Details Patient Name: Date of Service: KAREE, CHRISTOPHERSON 03/31/2020 8:45 A M Medical Record Number: [id number] Patient Account Number: [id number] Date of Birth/Sex: Treating RN: 04/21/1961 (59 y.o. [name] Primary Care Krystan Northrop: [name] Other Clinician: Referring Skiler Tye: Treating Shylee Durrett/Extender: [name], [name] in Treatment: 1 Edema Assessment Assessed: [Left: No] [Right: No] Edema: [Left: Yes] [Right: Yes] Calf Left: Right: Point of Measurement: From Medial Instep 27.8 cm 26 cm Ankle Left: Right: Point of Measurement: From Medial Instep 21 cm 24.5 cm Vascular Assessment Pulses: Dorsalis

## 2020-04-03 NOTE — Progress Notes (Signed)
[name] ([id number]) Visit Report for 03/31/2020 Debridement Details Patient Name: Date of Service: [name], [name] 03/31/2020 8:45 A M Medical Record Number: [id number] Patient Account Number: [id number] Date of Birth/Sex: Treating RN: [birth year] (60 y.o. [name] Primary Care Provider: [name] Other Clinician: Referring Provider: Treating Provider/Extender: [name], [name] in Treatment: 1 Debridement Performed for Assessment: Wound #44 Right,Proximal,Lateral Foot Performed By: Physician Maxwell Caul., MD Debridement Type: Debridement Level of Consciousness (Pre-procedure): Awake and Alert Pre-procedure Verification/Time Out Yes - 09:50 Taken: Start Time: 09:50 Pain Control: Other : benzocaine 20 % spray T Area Debrided (L x W): otal 3 (cm) x 1.8 (cm) = 5.4 (cm) Tissue and other material debrided: Viable, Non-Viable, Slough, Subcutaneous, Slough Level: Skin/Subcutaneous Tissue Debridement Description: Excisional Instrument: Curette Bleeding: Minimum Hemostasis Achieved: Pressure End Time: 09:54 Procedural Pain: 5 Post Procedural Pain: 2 Response to Treatment: Procedure was tolerated well Level of Consciousness (Post- Awake and Alert procedure): Post Debridement Measurements of Total Wound Length: (cm) 3 Stage: Unstageable/Unclassified Width: (cm) 1.8 Depth: (cm) 0.1 Volume: (cm) 0.424 Character of Wound/Ulcer Post Debridement: Improved Post Procedure Diagnosis Same as Pre-procedure Electronic Signature(s) Signed: 03/31/2020 6:06:29 PM By: Zenaida Deed RN, BSN Signed: 04/03/2020 1:46:42 PM By: Baltazar Najjar MD Entered By: Baltazar Najjar on 03/31/2020 09:56:03 -------------------------------------------------------------------------------- Debridement Details Patient Name: Date of Service: [name], [name] 03/31/2020 8:45 A M Medical Record Number: [id number] Patient Account Number: [id number] Date of  Birth/Sex: Treating RN: 01/09/1961 (59 y.o. [name] Primary Care Provider: [name] Other Clinician: Referring Provider: Treating Provider/Extender: [name], [name] in Treatment: 1 Debridement Performed for Assessment: Wound #45 Right,Distal,Lateral Foot Performed By: Physician Maxwell Caul., MD Debridement Type: Debridement Level of Consciousness (Pre-procedure): Awake and Alert Pre-procedure Verification/Time Out Yes - 09:50 Taken: Start Time: 09:50 Pain Control: Other : benzocaine 20 % spray T Area Debrided (L x W): otal 1.8 (cm) x 1.7 (cm) = 3.06 (cm) Tissue and other material debrided: Slough, Subcutaneous, Slough Level: Skin/Subcutaneous Tissue Debridement Description: Excisional Instrument: Curette Bleeding: Minimum Hemostasis Achieved: Pressure End Time: 09:54 Procedural Pain: 5 Post Procedural Pain: 2 Response to Treatment: Procedure was tolerated well Level of Consciousness (Post- Awake and Alert procedure): Post Debridement Measurements of Total Wound Length: (cm) 1.8 Stage: Unstageable/Unclassified Width: (cm) 1.7 Depth: (cm) 0.1 Volume: (cm) 0.24 Character of Wound/Ulcer Post Debridement: Improved Post Procedure Diagnosis Same as Pre-procedure Electronic Signature(s) Signed: 03/31/2020 6:06:29 PM By: Zenaida Deed RN, BSN Signed: 04/03/2020 1:46:42 PM By: Baltazar Najjar MD Entered By: Baltazar Najjar on 03/31/2020 09:56:17 -------------------------------------------------------------------------------- Debridement Details Patient Name: Date of Service: [name], [name] 03/31/2020 8:45 A M Medical Record Number: [id number] Patient Account Number: [id number] Date of Birth/Sex: Treating RN: Apr 24, 1961 (59 y.o. [name] Primary Care Provider: [name] Other Clinician: Referring Provider: Treating Provider/Extender: [name], [name] in Treatment: 1 Debridement  Performed for Assessment: Wound #42 Right,Medial Malleolus Performed By: Physician Maxwell Caul., MD Debridement Type: Debridement Severity of Tissue Pre Debridement: Fat layer exposed Level of Consciousness (Pre-procedure): Awake and Alert Pre-procedure Verification/Time Out Yes - 09:50 Taken: Start Time: 09:50 Pain Control: Other : benzocaine 20 % spray T Area Debrided (L x W): otal 1.5 (cm) x 3 (cm) = 4.5 (cm) Tissue and other material debrided: Viable, Non-Viable, Slough, Subcutaneous, Slough Level: Skin/Subcutaneous Tissue Debridement Description: Excisional Instrument: Curette Bleeding: Minimum Hemostasis Achieved: Pressure End Time: 09:54 Procedural Pain: 5 Post Procedural Pain: 2 Response to Treatment: Procedure was tolerated well  Level of Consciousness (Post- Awake and Alert procedure): Post Debridement Measurements of Total Wound Length: (cm) 1.5 Width: (cm) 3 Width: (cm) 3 Depth: (cm) 0.1 Volume: (cm) 0.353 Character of Wound/Ulcer Post Debridement: Improved Severity of Tissue Post Debridement: Fat layer exposed Post Procedure Diagnosis Same as Pre-procedure Electronic Signature(s) Signed: 03/31/2020 6:06:29 PM By: Zenaida Deed RN, BSN Signed: 04/03/2020 1:46:42 PM By: Baltazar Najjar MD Entered By: Zenaida Deed on 03/31/2020 09:56:25 -------------------------------------------------------------------------------- Debridement Details Patient Name: Date of Service: [name]. 03/31/2020 8:45 A M Medical Record Number: [id number] Patient Account Number: [id number] Date of Birth/Sex: Treating RN: 1961-05-09 (59 y.o. [name] Primary Care Provider: [name] Other Clinician: Referring Provider: Treating Provider/Extender: [name], [name] in Treatment: 1 Debridement Performed for Assessment: Wound #40 Left,Medial Lower Leg Performed By: Physician Maxwell Caul., MD Debridement Type:  Debridement Severity of Tissue Pre Debridement: Fat layer exposed Level of Consciousness (Pre-procedure): Awake and Alert Pre-procedure Verification/Time Out Yes - 09:50 Taken: Start Time: 09:50 Pain Control: Other : benzocaine 20 % spray T Area Debrided (L x W): otal 4 (cm) x 5.5 (cm) = 22 (cm) Tissue and other material debrided: Viable, Non-Viable, Slough, Subcutaneous, Slough Level: Skin/Subcutaneous Tissue Debridement Description: Excisional Instrument: Curette Bleeding: Minimum Hemostasis Achieved: Pressure End Time: 09:54 Procedural Pain: 5 Post Procedural Pain: 2 Response to Treatment: Procedure was tolerated well Level of Consciousness (Post- Awake and Alert procedure): Post Debridement Measurements of Total Wound Length: (cm) 4 Width: (cm) 5.5 Depth: (cm) 0.1 Volume: (cm) 1.728 Character of Wound/Ulcer Post Debridement: Improved Severity of Tissue Post Debridement: Fat layer exposed Post Procedure Diagnosis Same as Pre-procedure Electronic Signature(s) Signed: 03/31/2020 6:06:29 PM By: Zenaida Deed RN, BSN Signed: 04/03/2020 1:46:42 PM By: Baltazar Najjar MD Entered By: Zenaida Deed on 03/31/2020 09:57:12 -------------------------------------------------------------------------------- HPI Details Patient Name: Date of Service: [name]. 03/31/2020 8:45 A M Medical Record Number: [id number] Patient Account Number: [id number] Date of Birth/Sex: Treating RN: 30-Apr-1961 (59 y.o. [name] Primary Care Provider: [name] Other Clinician: Referring Provider: Treating Provider/Extender: [name], [name] in Treatment: 1 History of Present Illness HPI Description: 11/23/15; this is a patient I remember from a previous stay in this clinic in 2012. The patient says this was for a wound in this same area as currently although I have no information to verify that. Most of the information is provided by his caregiver from  the group home where he resides. Apparently he has had a wound in this area for 8 months. He also has erythema around this area and has had multiple rounds of antibiotics apparently with some improvement but the erythema is persists. He apparently had a fracture in the ankle 2 months ago and was seen by Dr. Lajoyce Corners . He apparently had a splint applied and more recently a heel offloading boot. He had home health coming out to a week ago not clear what they are dressing this with but they apparently discharged him perhaps because of one of Dr. 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 He has a Nurse, adult for transferring at home he is nonambulatory and does not wear footwear. He is not a diabetic. Looking through [facility] he appears to have fairly active primary care. The area on the ankle is variably labeled as a pressure area and/or chronic venous insufficiency. He had a recent venous ultrasound on 5/25 that did not show a DVT in the left leg however this was  not a reflux study. There was no superficial DVT either. Arterial studies on 10/15/14 showed a left ABI of 1.16 Center right of 1.12 waveforms were triphasic he does not have significant arterial disease. He has had recent x-rays of the left foot and ankle. The left foot did not show any abnormality. Left ankle x-ray showed a spiral fracture of the left tibial diaphysis without evidence of osteomyelitis. 11/30/15 culture I did last week showed pseudomonas I changed him to oral ciprofloxacin. Erythema is a lot better. 12/07/15 area around the wound shows considerable improvement of the periwound erythema. 12/14/15; the patient has a improvement of the periwound erythema which in retrospect I think with cellulitis successfully treated. We have been using Iodoflex started last week 12/25/15 wound on the left medial malleolus and dorsal left foot. Theraskin #1 applied READMISSION 02/27/16; the patient was  admitted to hospital from 8/14 through 01/18/16 initially felt to have cellulitis of his left lower leg however he was noted to have significant persistent pain and swelling. A duplex ultrasound was ordered that was negative. Obie Dredge an x-ray was done that showed a spiral fracture of the left leg. With posterior displacement and angulation at the mid left tibia. Orthopedics saw the patient and splinted the leg. He was then sent to [facility] skilled facility and only return to his group home last week. He has wounds on the left medial malleolus, left dorsal foot and a worrisome area on the plantar left heel which is not really open but may represent a hematoma or a DTI 03/06/16; areas of left anterior and medial ankle both look improved. There are 3 small wounds here. He has a area over the dorsal left first metatarsal head which is not open. In meantime his left heel has opened 03/14/16 the medial left ankle wounds looks stable to improved. The area on the posterior left heel has opened up and had a very adherent eschar and slept over this 03/21/16 patient has 3 wounds on his left medial ankle is stable area on his left dorsal first metatarsal head appears to be improved. Continued problems with adherent eschar over the left heel 03/28/16; patient has 3 wounds on his left medial ankle and lower leg all of these appear to be stable. The problem continues to be the left heel and the adherent eschar. This may have been a pressure ulcer from her brace at one point. He has a English as a second language teacher we have been using [employer] and all wound areas. Dressings are being changed by home health 04/04/16; the patient's areas on the medial aspect of the left ankle looks superficial and appear to be gradually closing. The area on the plantar aspects/Achilles aspect of his left heel as surface slough and nonviable subcutaneous tissue requiring debridement. 04/11/16; the areas on the patient's medial left ankle appear to be  superficial and appear to be closing. The area on the plantar heel on the left has surface slough and nonviable subcutaneous tissue requiring debridement. I don't think this is too much different from last week there is also undermining 04/25/16; patient is followed in his group home by [facility] home health. The area on the plantar heel is smaller but with still with some depth. We're using Iodoflex to this. The areas on the left ankle looks healthy but dry and somewhat larger. Cause of this is not really clear using Prisma 05/02/16; areas x3 all look improved expecially the areas on the medial ankle. Heel wound is smaller but still deep  05/09/16; all areas 3 look improved on the medial ankle. Heel wound is also contracting. 12/0.9/17 the areas on his left medial ankle are all closed. I think these are largely chronic venous insufficiency however there is no edema. The area on his left heel was a pressure area. READMISSION 08/02/16; this man is a patient that we have had in this clinic on several times before. I believe he has cerebral palsy is nonambulatory. He has chronic venous insufficiency with venous inflammation. He was last year in the fall of 2017 with areas on his left medial ankle left heel and left dorsal foot. He arrives today with once again a vague history. As mentioned he is nonambulatory he spends most of his day in a pressure offloading boot nevertheless they noted wounds in this area on the medial left ankle 2 weeks ago. There is any other history here I am uncertain 08/15/16; the patient's wounds on his medial ankle actually look better however the area on the left lateral/dorsal foot is covered in black necrotic eschar. Previous formal arterial studies did not show significant arterial disease with normal ABIs and triphasic waveforms. He is immobilized and at risk for pressure although he seems aware protective boots at all times as far as I am aware. 08/22/16; in general his wounds  look better. All the wounds on his medial left ankle look improved the area on his lateral foot however continues to be problematic. Patient comes in the clinic today in a new wheelchair he is quite delighted 09/05/16; a wound on the left medial Calf and left dorsal foot are healed. The area on the left lateral foot is improved however the area on the left medial malleolus is inflamed with a considerable degree of surrounding erythema. This looks like cellulitis. ABIs and clinical exam are within normal limits 09/12/16; the patient continues to have a wound on the left medial calf. I was concerned about cellulitis in this area last week and gave him a course of doxycycline. The wound looks better here. The area on the left lateral foot however is unchanged 09/20/16; the patient came in this week early after traumatizing his toe on the left foot on a table while moving around in his motorized wheelchair. 09/26/16- he is here for follow-up evaluation of his left foot and malleolus ulcers. Since his appointment he abraded his left medial hallux against a table while in his motorized wheelchair, apparently trying to pull up to the table for a meal. He continues to complain of tenderness to the left lateral foot, surrounding the current ulcer 10/07/16; left foot and malleolus ulcers. 10/14/16; the patient is doing well. He has a wound on the left lateral, left medial malleolus and atraumatic wound on the left great toe. Although these appear to be superficial and closing 10/31/16; the patient is doing well. The area on the left lateral foot, left medial malleolus and the left great toe or all epithelialized. He has home health 11/14/16; patient arrives today with the area on the left great toe healed. The left lateral foot however in the left medial malleolus and the ankle superficial wounds that are slightly larger. He has erythema and tenderness around the area on the left lateral foot. As well he is complaining  of dysuria. His caregiver notes that when he gets like this he is more lethargic which indeed seems to be the case today 11/21/16; left great toe is still healed. His wound on the left lateral foot appears healthy and slightly  smaller also the area on the left medial ankle. This had black eschar on it which I removed. The remaining wound looks satisfactory 12/12/16; patient hasn't been here in a number of weeks. The area on the left lateral foot is healed however on his medial ankle has 2 open areas one of them covered with a necrotic eschar. He also has a new wound on the right second toe which was apparently trauma induced while he was at a movie 12/27/16; the patient has wounds on his left medial mallelus and left lateral foot that are just about closed. The area on the right second toe which was trauma from last time is still open but again everything looks a lot better here. 01/30/17; patient arrives in clinic today and is really been a long time since I've seen him. The wounds are all epithelialized on the left foot this includes his left lateral foot, left medial malleolus. Apparently was in the ER for what of I think was a fungal infection on his buttock's he was prescribed Diflucan and doxycycline I think this is already getting better. READMISSION T is a now 59 year old man who has advanced cerebral palsy and functional quadriplegia. We have had him in this clinic multiple times before for wounds erry on his right ankle, left ankle, left dorsal foot and most recently from 08/02/16 through 01/30/17 with his left medial ankle and left foot and finally right second toe. His attendant from the group home where he lives is very knowledgeable about him. She states that they noticed breakdown in his skin in his feet sometime in January although they could not get an appointment until today in this clinic. The previous wounds have always been felt to be secondary to incidental trauma/pressure areas or  possibly some degree of chronic venous insufficiency. He had normal arterial studies in may of 2016. He has definite open wounds on the right lateral foot over the base of the fifth metatarsal, the right second toe DIP, the left medial malleolus. He has concerning areas over the left lateral fifth metatarsal base, retaform purpura over the medial left ankle and perhaps the medial left first toe. 07/21/17; culture we did last week from the base of the right fifth metatarsal grew group G strep. This should've been sensitive to the doxycycline I gave him. The other major wounds are on the right second toe DIP, left medial malleolus, base of the left lateral fifth metatarsal and the medial left ankle and medial left toe. 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 We have macrovascular arterial studies scheduled for Thursday although I don't think this is what the problem is. 07/28/17; patient arrives with really no improvement. He has a wound on the right second toe, lateral right foot, right medial malleolus which was new this week, left medial malleolus, but looked like a DTI on the left great toe. His macrovascular studies are on 07/30/17 although I would be surprised if this provides all the explanation. He has small areas even on his right second and third toe which look like splinter-type hemorrhages. The question I have is where is this coming from. I'd like to see the vascular studies however before we pursue this. He has had a DVT in the past but is no longer on anticoagulants. I'll need to review his history 08/04/17; the patient had his ARTERIAL STUDIES this showed a right ABI of 1.16 and the left ABI of 1.20. TBI on the left at 0.84. He is at a right  first toe amputation. The studies were unchanged from studies in May 2016. It was not felt that he had significant lower extremity arterial disease In the meantime we are using silver alginate to the wounds on the right second toe right  lateral foot left medial malleolus. I'm going to change to Silver collagen today. 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 Working him up for this would include an echocardiogram probably a CT scan of the chest and abdomen to look at the major thoracic and abdominal aortic vessels. I am not going to involve myself in this unless there is more symptoms/signs 08/18/17; the patient arrives today with wounds looking somewhat better. The remaining areas are the right second toe dorsal, right lateral foot and left medial malleolus which only has a small open area. We've been using silver collagen 09/01/17;right second toe dorsally probes to bone today. This is a deterioration. We've been using silver collagen. Only a small open area remains on the left medial malleolus. 09/09/17; right second toe appears better. There is no probing bone. He has a small area remaining on the left medial malleolus, the right lateral foot. We have been using silver collagen 10/07/17; the patient returns to clinic today after a month hiatus. He has wounds over the right second toe, right lateral foot and the left medial malleolus. We have been using silver collagen. He has home health. 10/24/17; patient has open wounds over the right second toe, now the left fourth toe and a superficial area over his left buttock. Some of the other areas have healed including the left medial malleolus and right lateral foot. Using silver collagen on these wounds 11/07/17; the patient has closed his wounds on the right second toe and left fourth toe although they look all normal. Last time he was here he had a superficial area over the left buttock this is also closed. The right lateral foot wound I had closed out last time although it is seems to have reopened today. 628/19 the areas over the right second and left fourth  toe remains closed. The right lateral foot has tightly adherent necrotic surface over. We have been using silver collagen he offloads this and a bunny boot 12/12/17; the patient has reopened the recurrent area over the right second PIP joint. 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 He does not have obvious macrovascular disease. The area over the right lateral foot looks about the same as last time tightly adherent necrotic debris. We've been using Hydrofera Blue 12/25/17; 2 week follow-up. The area over the right second PIP is closed however the area on the right lateral foot is worse with surrounding erythema and pain compatible with cellulitis is going to need an antibiotic. AS WELL..... We have looked at his buttocks and lower back. He has 3 stage II ulcers. The exact history here is unclear however he is very disabled man with cerebral palsy. He goes to a day program from the group home in which she lives 5 days a week and I think he is on his buttock all day on these visits. Surrounding the wounds see has extensive stage I injury as well. FINALLY it looks as though he has lost weight. I'm not the only staff member that is recognized this 01/08/18 on evaluation today patient appears to be doing better in  regard to his gluteal wounds. In fact everything appears to be healed back here although very dry. He has a couple open sores on the scrotal region other than this it is really his foot that is still giving him the most trouble. He did complete the Keflex unfortunately it seems like there's still a lot of erythema in this in fact looks worse than it did previous. I'm gonna culture this today and likely start with a different antibiotic. 01/16/18; I was reassured that the wounds on his buttocks and sacrum are healed. I did not look at these the day. The culture that was done last week on 01/11/18 of the right lateral foot wounds showed  Pseudomonas. Bactrim is not a good choice for this. Ciprofloxacin interacts with his muscle relaxants therefore I prescribed cefdinir 300 twice a day for 7 days. Bactrim coverage of pseudomonas is not reliable although the degree of erythema looks a lot better 01/30/18; the patient has completed his antibiotics. The area does not have infection on the right lateral foot. The wounds look a lot better. We've been using silver alginate 02/12/18; there is on the right lateral foot. Copious amounts of surface eschar. Also similarly over the PIP of the right second toe. We've been using silver alginate to date 03/02/2018; the area that remains is on the right lateral foot. The right second toe is healed. 03/16/2018; the area there is original wound on the right lateral foot. Still requiring debridement. We are using Hydrofera Blue. The right second toe is still healed/closed 03/30/2018; right lateral foot there is 2 small open areas remaining. They actually look quite healthy we have been using Hydrofera Blue the right second toe remains closed 04/13/2018; right lateral foot. 1 of the 2 small open areas from last time has closed over. The other had surface slough that I removed with a #3 curette. This looks quite healthy. The right second toe remains closed. We are using Hydrofera Blue 05/01/2018; right lateral foot very superficial areas that look like they are trying to close over. He had a new threatened area on the left medial ankle just below the medial malleolus. I think these represent chronic venous insufficiency areas 05/14/2018; the right right lateral foot looks like it closed over as was predicted last time however he has some dark areas subcutaneously erythema superiorly and this is really very tender. This is either coexistent cellulitis or perhaps a deep tissue injury. The area on the left medial ankle is superficial but has some size. We have been using Hydrofera Blue to both areas. The patient  complains of pain in his right foot 06/09/2018; right lateral foot was closed over last time but there was coexistent cellulitis around this. I gave him Levaquin. He also has an area on the left medial malleolus. He has not been back to clinic in over 3-1/2 weeks. We are using Hydrofera Blue I think to the left medial malleolus and silver alginate to the right lateral foot 1/21; right lateral foot still non-viable debris over this area and the left medial malleolus. Both of these vigorously washed off with antiseptic gauze. We have been using silver alginate. 2/4; right lateral foot this looks somewhat better. Unfortunately the area over the left medial malleolus has deteriorated. Using silver alginate in both area 2/18; both wounds arrived today covered in a lot of necrotic debris. We have been using silver alginate. Clearly not making a lot of progress. 3/3; the patient's right second toe is reopened over the  PIP. I am not sure why this is happening. He may have microvascular disease. We have checked his macrovascular flow in the past and found it to be normal. He may need an x-ray. The area on the right lateral foot requires debridement. The area on the left lateral ankle is worse. We have been using PolyMem Ag 08/26/18 on evaluation today patient appears to be doing a little worse in regard to his lower extremities based on what I'm seeing what the nursing staff is telling me. His left medial ankle appears potential to be infected there is your thing on want surrounding the wound. His right second toe is also of concern at this point in my pinion. We have not done x-rays of these areas that may be a good idea to go ahead and proceed with at this point to ensure will not deal with anything such as osteomyelitis. 4/16; it is been a while since I saw this patient. He now has wounds on the right lateral foot, dorsal right second toe, substantially on the left medial malleolus and a small area on the  left lateral foot. We have been using PolyMem Silver. Curlex and Coban. He has home health changing the dressing. Since last time he was here he had a culture of the left medial ankle that showed MRSA. He is completed a course/7 days of doxycycline. XRAYS of the left ankle and right foot that were ordered were negative for osteomyelitis 4/30; he arrives in clinic today with some maceration around the wound and a lot of tenderness and erythema in a circumferential area around the right lateral foot. He has wound areas on the right lateral foot dorsal right second toe, largely over the left medial malleolus and a small area over the left lateral foot 5/7; completed his antibiotics from last time right lateral foot cellulitis is better and the area on his left knee which we discovered after he had been seen by the provider also is improved. He still has extensive wound areas over the right lateral foot, left medial malleolus, right second toe and left lateral foot although all of these look somewhat better with PolyMem 5/15. Wounds on the right lateral foot, right second toe left medial malleolus and the left lateral foot. All of these look somewhat better. We have been using PolyMem Ag 5/26; the wound on the right lateral foot is almost closed over but he still complains of a lot of pain in this area. He has a eschared area on the right second toe but no open wound.. Also concerning today his original wound on the left medial malleolus looks healthy however just inferior to this almost areas of deep tissue injury and there is an area on the left lateral foot also looks like a deep tissue injury. In the past I have wondered whether he might have cholesterol emboli or some other form of a vasculopathy. I wonder whether this could have something to do with this. 6/2; right lateral foot wound is still open I gave him empiric antibiotics last week without really a lot of change she is still complaining of  pain in this area. We x- rayed his feet at some point in April these were negative. It would be difficult to do an MRI. The area on his left medial malleolus looks better. Right second toe is healed. He still has what looks to be a small pressure related area on the left lateral foot but it is not open. We are using  PolyMem to all wound which seems to be doing nicely 6/16; right lateral foot wound is still open. The wound looks better although there is still a lot of tenderness around it. Rather than more empiric antibiotics I have cultured this today. We are using PolyMem Ag. On the left lateral malleolus the wound appears better. He has a new area on the left lower anterior pretibial area 6/29; 2-week follow-up. Culture I did of the right lateral foot 2 weeks ago showed MRSA. I gave him 2 weeks of Septra which he should be just about finishing now and this seems better. We got a call from home health last week to report swelling in the left leg because we were listed as his primary. We referred him back to primary care. He arrives today with intense erythema and tenderness around the 2 wounds on the left medial ankle on the left lower calf. The area on the right lateral foot looks better 7/7; I brought him back this week to follow-up on his left foot which had cellulitis last week. I gave him clindamycin which as far as I know he tolerated. The erythema is better. He has a 3 current wounds one on the right lateral foot a small area on the right lateral malleolus and the more recent area on the left anterior pretibial area. 7/21; bilateral distal lower leg and foot wounds. The area on his right lateral foot looks a lot better. He has an area on his left pretibial area distally. The area on the medial malleolus on the left which is 1 of his worst wounds has closed over. He does not appear to have any open areas in his remaining toes 8/4-Patient returns at 2 weeks, he has the remaining wound on the  left lower leg the other 2 wounds have healed. Patient is continued to be treated with silver alginate with a 2 layer wrap on the left 8/18; I been following this patient for a long period for bilateral lower extremity wounds. 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 His wounds are probably pressure although he religiously wears thick bunny boots. He does not have macrovascular disease but I did formal testing on him perhaps 2 or 3 years ago. I have often wondered about either cholesterol emboli or a vasculopathy of some sort given the distal nature of these variable locations etc. He has 2 areas that we have been following one on the left medial lower leg and one on the right lateral foot. Both of these are mostly healed today there is some eschar over sections of both areas although I did not disturb this. 03/31/2019 on evaluation today patient appears to be doing a little bit more poorly in regard to the sacral region. He has 2 small wounds noted at this point. Fortunately there is no signs of active infection at this time. No fevers, chills, nausea, vomiting, or diarrhea. He is having some pain when he sits on his gluteal region a big part of the reason he has these wounds is likely due to the fact that his wheelchair has not been functioning properly. He does still have a couple areas of deep tissue injury on his lower extremities he does need new Prevalon offloading boots as well. 04/28/2019 on evaluation today patient appears to be doing well with regard to his wounds in particular. His ankle area has fairly small based on what I am seeing currently and his sacral region actually is  still open but does not appear to be doing too poorly in my opinion. Fortunately there is no signs of active infection at this time. He was in the hospital unfortunately and this was from 04/08/2019 through 04/12/2019 secondary to persistent diarrhea, dehydration  and acute kidney injury. With that being said fortunately the patient does not seem to be doing too poorly at this time which is good news. He is having pain mainly in the sacral region. 06/23/2019 on evaluation today patient appears to be doing well with regard to his sacral region which in fact is completely healed. Unfortunately he has an area of rubbing/pressure on his right lateral chest/back region. This is where it is rubbing up on his chair. Subsequently he also has an open wound on the left medial malleolus which is not very open but starting to crack and then on the right lateral foot this is much more open at this point. Unfortunately there is no signs of significant improvement in regard to these areas and in fact there does appear to be evidence of active infection quite significantly. No fevers, chills, nausea, vomiting, or diarrhea. The patient did test positive for COVID-19 that has been greater than 14 days ago although they did not know the exact timing. That is why he was not here for his last appointment. 07/07/2019 upon evaluation today patient actually appears to be doing quite well in regard to his left ankle as well as the chest/back region which is doing great in fact appears to be healed in my opinion. He still has an issue on his lateral right foot that is open and appears to be infected but does have me more concerned. In fact I do believe the doxycycline helped in general for the cellulitis but I believe that may have been more MRSA related based on the culture that we did. What I am seeing right now has more of the blue-green drainage and may be more consistent with a Pseudomonas that I was hoping was not really causing much of an issue but I think it may be at this point on the right lateral foot. For that reason I do think treatment would be a good idea of the reason I avoided the Cipro before was the potential for QT prolongation which could obviously cause problems with  the patient with results of interaction with respiratory wound. The tizanidine also had some interactions as well. Nonetheless I think at this point that it would be a possibility for [place] to use topical gentamicin to see if this could be of benefit for the patient underneath the silver alginate dressing. 07/21/2019 upon evaluation today patient appears to be doing fairly well at this point in regard to his lower extremity on the right. He does have a wound on his right lateral foot as well as the right fifth toe and the right fourth toe. Unfortunately he continues to experience significant issues with pressure and trauma to some degree and I think this is just due to the fact that again he is not really not able to control his legs and what is happening here. Fortunately there is no signs of active infection at this time. 08/04/2019 upon evaluation today patient actually appears to be doing quite well with regard to his toe ulcers I am very pleased in that regard. Unfortunately the foot ulcer is showing some signs of erythema his primary care provider has placed him on clindamycin at this point. Again I do believe this can be  beneficial for him. With that being said I do think the patient may benefit from a light Kerlix and Coban wrap to help with some of his edema at this point. 08/18/2019 upon evaluation today patient appears to be doing still poorly in regard to his lateral foot ulcer. This is not very deep but there is some concern about the possibility of osteomyelitis. I think it would be appropriate for [place] to go ahead and have an x-ray at this time. The patient is currently on clindamycin which from his culture which was performed on 06/23/2019 it does appear that he would benefit from that in regard to the MRSA. However Pseudomonas was also identified then and the patient actually has some blue-green drainage at this point as well. I am much more concerned about the possibility of the Pseudomonas  being an infection is causative agent at this point that I am the MRSA. Subsequently Cipro the patient was sensitive to but we never use that is stable use a topical gent due to the fact that unfortunately there was some interaction between the Cipro and the patient's risk for down potentially causing QT prolongation. 08/31/2019; have not seen this patient in quite a period of time. He has a substantial wound on his right lateral foot we have been using gentamicin ointment with Hydrofera Blue over the top. From what I am able to determine he is also on antibiotics perhaps clindamycin although I have not had a chance to look over this. He also has an eschared area on the right fifth toe as well as the right second toe. 4/20; right lateral foot wound. He comes in today with intense erythema and tenderness around this wound. The wound itself has a surface on it but it doesn't look particularly healthy a lot of gelatinous debris. There is no palpable bone. We've been using gentamicin ointment and Hydrofera Blue. Since the patient was last here he was seen by Dr. Algis Liming of infectious disease. He ordered an MRI that I think is due later this month on 4/30. He apparently also checked a sedimentation rate and C-reactive protein metabolic panel CBC with differential. At the time he was seen he wanted to withhold any microbials pending identification of the depth of the infection although looking at this I don't know that I'm going to be able to hold off for another 10 days. 4/29; he went on to have the MRI that was ordered by Dr. Algis Liming of infectious disease. 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 I had given him cefdinir last week for surrounding cellulitis he is finishing this today. I have communicated with Dr. Algis Liming who wants bone for pathology and culture before proceeding with consideration of IV antibiotics. Fortunately he is due to see Dr. Samuella Cota of podiatry  today I am hopeful that the bone biopsy and culture will be considered there 5/4 as I understand things Dr. Samuella Cota actually wants to remove the diseased proximal fifth metatarsal. Looking back at this decision is probably not a bad one. He has good blood flow he is nonambulatory they are apparently going to do this next week. Paradoxically the wound on the lateral part of his foot is actually a lot better. The cellulitis that was so problematic 2 weeks ago also has resolved. 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 The last time I saw him he had a diseased proximal fifth metatarsal. On erry 09/28/2019 she  he underwent a right fifth ray amputation by Dr. Samuella Cota. Culture of this showed Pseudomonas and he was followed by infectious disease Dr. Algis Liming treated with ciprofloxacin. 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 He had Integra placed. A CNS showed Pseudomonas again. Since then he has had Silvadene as a primary dressing, then Santyl and more recently Xeroform and then back to Santyl 3 times a week per his attendant who comes with him. Reviewing his records I was really not prepared for the number of wounds that he currently has. This includes substantial areas on the right lateral foot, right lateral ankle, right fourth toe dorsally, right ankle anteriorly as well as the left lateral ankle and foot. His medical history is unchanged ABIs 1.2 on the right and 1.21 on the left he has not been known to have arterial insufficiency and is not a diabetic 11/5; patient I readmitted to the clinic last week. He has multiple wounds on the right lateral foot, right fourth toe and a new area on the right second toe. He has areas on the right dorsal ankle and a substantive wound over the left lateral ankle and foot. We are using silver  collagen. We put him under compression last week and this seems to have helped. He has home health coming out one other time per week to change the dressing Electronic Signature(s) Signed: 04/03/2020 1:46:42 PM By: Baltazar Najjar MD Entered By: Baltazar Najjar on 03/31/2020 09:57:14 -------------------------------------------------------------------------------- Physical Exam Details Patient Name: Date of Service: [name], [name] 03/31/2020 8:45 A M Medical Record Number: [id number] Patient Account Number: [id number] Date of Birth/Sex: Treating RN: December 12, 1960 (59 y.o. [name] Primary Care Provider: [name] Other Clinician: Referring Provider: Treating Provider/Extender: [name], [name] in Treatment: 1 Constitutional Patient is hypertensive.. Pulse regular and within target range for patient.Marland Kitchen Respirations regular, non-labored and within target range.. Temperature is normal and within the target range for the patient.Marland Kitchen Appears in no distress. Notes Wound exam Substantial number of areas on the right foot and ankle. He required debridement with an open curette of the right lateral foot and ankle wound as well as the left ankle and foot medial wounds. Adherent fibrinous debris. No evidence of surrounding infection Electronic Signature(s) Signed: 04/03/2020 1:46:42 PM By: Baltazar Najjar MD Entered By: Baltazar Najjar on 03/31/2020 09:59:09 -------------------------------------------------------------------------------- Physician Orders Details Patient Name: Date of Service: [name], [name] 03/31/2020 8:45 A M Medical Record Number: [id number] Patient Account Number: [id number] Date of Birth/Sex: Treating RN: [birth year] (59 y.o. 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 Dressing Change Frequency Other: - change twice a week. once at wound center and once at home health. Skin Barriers/Peri-Wound Care Moisturizing lotion - both legs. Wound Cleansing Clean wound with Wound Cleanser May shower with protection. - use cast protectors. Primary Wound Dressing Wound #40  Left,Medial Lower Leg Silver Collagen - saline moisten gauze backing the collagen. Wound #46 Right T Second oe Silver Collagen - saline moisten gauze backing the collagen. Wound #41 Right T Fourth oe Silver Collagen - saline moisten gauze backing the collagen. Wound #42 Right,Medial Malleolus Silver Collagen - saline moisten gauze backing the collagen. Wound #43 Right,Distal,Anterior Lower Leg Silver Collagen - saline moisten gauze backing the collagen. Wound #44 Right,Proximal,Lateral Foot Silver Collagen - saline moisten gauze backing the collagen. Wound #45 Right,Distal,Lateral Foot Silver Collagen - saline moisten gauze backing the collagen. Secondary Dressing Dry Gauze ABD pad Wound #41 Right T Fourth oe Kerlix/Rolled Gauze Dry Gauze Wound #46 Right T Second oe Kerlix/Rolled Gauze Dry Gauze Edema Control 3 Layer Compression System - Bilateral Elevate legs to the level of the heart or above for 30 minutes daily and/or when sitting, a frequency of: - throughout the day. Off-Loading Multipodus Splint to: - or bunny boots to both feet. float feet  while resting in chair and bed. Home Health Continue Home Health skilled nursing for wound care. [facility] home health Electronic Signature(s) Signed: 03/31/2020 6:06:29 PM By: Zenaida Deed RN, BSN Signed: 04/03/2020 1:46:42 PM By: Baltazar Najjar MD Entered By: Zenaida Deed on 03/31/2020 09:59:33 -------------------------------------------------------------------------------- Problem List Details Patient Name: Date of Service: [name], [name] 03/31/2020 8:45 A M Medical Record Number: [id number] Patient Account Number: [id number] Date of Birth/Sex: Treating RN: 09/01/1960 (59 y.o. [name] Primary Care Provider: [name] Other Clinician: Referring Provider: Treating Provider/Extender: [name], [name] in Treatment: 1 Active Problems ICD-10 Encounter Code Description Active Date MDM Diagnosis I89.0 Lymphedema, not elsewhere classified 03/23/2020 No Yes T81.31XD Disruption of external operation (surgical) wound, not elsewhere classified, 03/23/2020 No Yes subsequent encounter S90.811D Abrasion, right foot, subsequent encounter 03/23/2020 No Yes L97.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 layer exposed 03/23/2020 No Yes L97.312 Non-pressure chronic ulcer of right 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 03/23/2020 No Yes L97.322 Non-pressure chronic ulcer of left 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 [name] Primary Care Provider: [name]  Other Clinician: Referring Provider: Treating Provider/Extender: [name], [name] in Treatment: 1 Subjective History of Present Illness (HPI) 11/23/15; this is a patient I remember from a previous stay in this clinic in 2012. The patient says this was for a wound in this same area as currently although I have no information to verify that. Most of the information is provided by his caregiver from the group home where he resides. Apparently he has had a wound in this area for 8 months. He also has erythema around this area and has had multiple rounds of antibiotics apparently with some improvement but the erythema is persists. He apparently had a fracture in the ankle 2 months ago and was seen by Dr. Lajoyce Corners . He apparently had a splint applied and more recently a heel offloading boot. He had home health coming out to a week ago not clear what they are dressing this with but they apparently discharged him perhaps because of one of Dr. 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 He has a  Hoyer lift for transferring at home he is nonambulatory and does not wear footwear. He is not a diabetic. Looking through  link he appears to have fairly active primary care. The area on the ankle is variably labeled as a pressure area and/or chronic venous insufficiency. He had a recent venous ultrasound on 5/25 that did not show a DVT in the left leg however this was not a reflux study. There was no superficial DVT either. Arterial studies on 10/15/14 showed a left ABI of 1.16 Center right of 1.12 waveforms were triphasic he does not have significant arterial disease. He has had recent x-rays of the left foot and ankle. The left foot did not show any abnormality. Left ankle x-ray showed a spiral fracture of the left tibial diaphysis without evidence of osteomyelitis. 11/30/15 culture I did last week showed pseudomonas I changed him to oral  ciprofloxacin. Erythema is a lot better. 12/07/15 area around the wound shows considerable improvement of the periwound erythema. 12/14/15; the patient has a improvement of the periwound erythema which in retrospect I think with cellulitis successfully treated. We have been using Iodoflex started last week 12/25/15 wound on the left medial malleolus and dorsal left foot. Theraskin #1 applied READMISSION 02/27/16; the patient was admitted to hospital from 8/14 through 01/18/16 initially felt to have cellulitis of his left lower leg however he was noted to have significant persistent pain and swelling. A duplex ultrasound was ordered that was negative. Obie Dredge an x-ray was done that showed a spiral fracture of the left leg. With posterior displacement and angulation at the mid left tibia. Orthopedics saw the patient and splinted the leg. He was then sent to [facility] skilled facility and only return to his group home last week. He has wounds on the left medial malleolus, left dorsal foot and a worrisome area on the plantar left heel which is not really open but may represent a hematoma or a DTI 03/06/16; areas of left anterior and medial ankle both look improved. There are 3 small wounds here. He has a area over the dorsal left first metatarsal head which is not open. In meantime his left heel has opened 03/14/16 the medial left ankle wounds looks stable to improved. The area on the posterior left heel has opened up and had a very adherent eschar and slept over this 03/21/16 patient has 3 wounds on his left medial ankle is stable area on his left dorsal first metatarsal head appears to be improved. Continued problems with adherent eschar over the left heel 03/28/16; patient has 3 wounds on his left medial ankle and lower leg all of these appear to be stable. The problem continues to be the left heel and the adherent eschar. This may have been a pressure ulcer from her brace at one point. 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 Dressings are being changed by home health 04/04/16; the patient's areas on the medial aspect of the left ankle looks superficial and appear to be gradually closing. The area on the plantar aspects/Achilles aspect of his left heel as surface slough and nonviable subcutaneous tissue requiring debridement. 04/11/16; the areas on the patient's medial left ankle appear to be superficial and appear to be closing. The area on the plantar heel on the left has surface slough and nonviable subcutaneous tissue requiring debridement. I don't think this is too much different from last week there is also undermining 04/25/16; patient is followed in  his group home by [facility] home health. The area on the plantar heel is smaller but with still with some depth. We're using Iodoflex to this. The areas on the left ankle looks healthy but dry and somewhat larger. Cause of this is not really clear using Prisma 05/02/16; areas x3 all look improved expecially the areas on the medial ankle. Heel wound is smaller but still deep 05/09/16; all areas o3 look improved on the medial ankle. Heel wound is also contracting. 12/0.9/17 the areas on his left medial ankle are all closed. I think these are largely chronic venous insufficiency however there is no edema. The area on his left heel was a pressure area. READMISSION 08/02/16; this man is a patient that we have had in this clinic on several times before. I believe he has cerebral palsy is nonambulatory. He has chronic venous insufficiency with venous inflammation. He was last year in the fall of 2017 with areas on his left medial ankle left heel and left dorsal foot. He arrives today with once again a vague history. As mentioned he is nonambulatory he spends most of his day in a pressure offloading boot nevertheless they noted wounds in this area on the medial left ankle 2 weeks ago. There is any other history here I am  uncertain 08/15/16; the patient's wounds on his medial ankle actually look better however the area on the left lateral/dorsal foot is covered in black necrotic eschar. Previous formal arterial studies did not show significant arterial disease with normal ABIs and triphasic waveforms. He is immobilized and at risk for pressure although he seems aware protective boots at all times as far as I am aware. 08/22/16; in general his wounds look better. All the wounds on his medial left ankle look improved the area on his lateral foot however continues to be problematic. Patient comes in the clinic today in a new wheelchair he is quite delighted 09/05/16; a wound on the left medial Calf and left dorsal foot are healed. The area on the left lateral foot is improved however the area on the left medial malleolus is inflamed with a considerable degree of surrounding erythema. This looks like cellulitis. ABIs and clinical exam are within normal limits 09/12/16; the patient continues to have a wound on the left medial calf. I was concerned about cellulitis in this area last week and gave him a course of doxycycline. The wound looks better here. The area on the left lateral foot however is unchanged 09/20/16; the patient came in this week early after traumatizing his toe on the left foot on a table while moving around in his motorized wheelchair. 09/26/16- he is here for follow-up evaluation of his left foot and malleolus ulcers. Since his appointment he abraded his left medial hallux against a table while in his motorized wheelchair, apparently trying to pull up to the table for a meal. He continues to complain of tenderness to the left lateral foot, surrounding the current ulcer 10/07/16; left foot and malleolus ulcers. 10/14/16; the patient is doing well. He has a wound on the left lateral, left medial malleolus and atraumatic wound on the left great toe. Although these appear to be superficial and closing 10/31/16; the  patient is doing well. The area on the left lateral foot, left medial malleolus and the left great toe or all epithelialized. He has home health 11/14/16; patient arrives today with the area on the left great toe healed. The left lateral foot however in the left medial malleolus  and the ankle superficial wounds that are slightly larger. He has erythema and tenderness around the area on the left lateral foot. As well he is complaining of dysuria. His caregiver notes that when he gets like this he is more lethargic which indeed seems to be the case today 11/21/16; left great toe is still healed. His wound on the left lateral foot appears healthy and slightly smaller also the area on the left medial ankle. This had black eschar on it which I removed. The remaining wound looks satisfactory 12/12/16; patient hasn't been here in a number of weeks. The area on the left lateral foot is healed however on his medial ankle has 2 open areas one of them covered with a necrotic eschar. He also has a new wound on the right second toe which was apparently trauma induced while he was at a movie 12/27/16; the patient has wounds on his left medial mallelus and left lateral foot that are just about closed. The area on the right second toe which was trauma from last time is still open but again everything looks a lot better here. 01/30/17; patient arrives in clinic today and is really been a long time since I've seen him. The wounds are all epithelialized on the left foot this includes his left lateral foot, left medial malleolus. Apparently was in the ER for what of I think was a fungal infection on his buttock's he was prescribed Diflucan and doxycycline I think this is already getting better. READMISSION T is a now 59 year old man who has advanced cerebral palsy and functional quadriplegia. We have had him in this clinic multiple times before for wounds erry on his right ankle, left ankle, left dorsal foot and most recently  from 08/02/16 through 01/30/17 with his left medial ankle and left foot and finally right second toe. His attendant from the group home where he lives is very knowledgeable about him. She states that they noticed breakdown in his skin in his feet sometime in January although they could not get an appointment until today in this clinic. The previous wounds have always been felt to be secondary to incidental trauma/pressure areas or possibly some degree of chronic venous insufficiency. He had normal arterial studies in may of 2016. He has definite open wounds on the right lateral foot over the base of the fifth metatarsal, the right second toe DIP, the left medial malleolus. He has concerning areas over the left lateral fifth metatarsal base, retaform purpura over the medial left ankle and perhaps the medial left first toe. 07/21/17; culture we did last week from the base of the right fifth metatarsal grew group G strep. This should've been sensitive to the doxycycline I gave him. The other major wounds are on the right second toe DIP, left medial malleolus, base of the left lateral fifth metatarsal and the medial left ankle and medial left toe. 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 We have macrovascular arterial studies scheduled for Thursday although I don't think this is what the problem is. 07/28/17; patient arrives with really no improvement. He has a wound on the right second toe, lateral right foot, right medial malleolus which was new this week, left medial malleolus, but looked like a DTI on the left great toe. His macrovascular studies are on 07/30/17 although I would be surprised if this provides all the explanation. He has small areas even on his right second and third toe which look like splinter-type hemorrhages. The question I have is  where is this coming from. I'd like to see the vascular studies however before we pursue this. He has had a DVT in the past but is no longer on  anticoagulants. I'll need to review his history 08/04/17; the patient had his ARTERIAL STUDIES this showed a right ABI of 1.16 and the left ABI of 1.20. TBI on the left at 0.84. He is at a right first toe amputation. The studies were unchanged from studies in May 2016. It was not felt that he had significant lower extremity arterial disease In the meantime we are using silver alginate to the wounds on the right second toe right lateral foot left medial malleolus. I'm going to change to Silver collagen today. 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 Working him up for this would include an echocardiogram probably a CT scan of the chest and abdomen to look at the major thoracic and abdominal aortic vessels. I am not going to involve myself in this unless there is more symptoms/signs 08/18/17; the patient arrives today with wounds looking somewhat better. The remaining areas are the right second toe dorsal, right lateral foot and left medial malleolus which only has a small open area. We've been using silver collagen 09/01/17;right second toe dorsally probes to bone today. This is a deterioration. We've been using silver collagen. Only a small open area remains on the left medial malleolus. 09/09/17; right second toe appears better. There is no probing bone. He has a small area remaining on the left medial malleolus, the right lateral foot. We have been using silver collagen 10/07/17; the patient returns to clinic today after a month hiatus. He has wounds over the right second toe, right lateral foot and the left medial malleolus. We have been using silver collagen. He has home health. 10/24/17; patient has open wounds over the right second toe, now the left fourth toe and a superficial area over his left buttock. Some of the other areas have healed including the left medial  malleolus and right lateral foot. Using silver collagen on these wounds 11/07/17; the patient has closed his wounds on the right second toe and left fourth toe although they look all normal. Last time he was here he had a superficial area over the left buttock this is also closed. The right lateral foot wound I had closed out last time although it is seems to have reopened today. 628/19 the areas over the right second and left fourth toe remains closed. The right lateral foot has tightly adherent necrotic surface over. We have been using silver collagen he offloads this and a bunny boot 12/12/17; the patient has reopened the recurrent area over the right second PIP joint. 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 He does not have obvious macrovascular disease. The area over the right lateral foot looks about the same as last time tightly adherent necrotic debris. We've been using Hydrofera Blue 12/25/17; 2 week follow-up. The area over the right second PIP is closed however the area on the right lateral foot is worse with surrounding erythema and pain compatible with cellulitis is going to need an antibiotic. ooAS WELL..... We have looked at his buttocks and lower back. He has 3 stage II ulcers. The exact history here is unclear however he is very disabled man with cerebral palsy. He goes to a  day program from the group home in which she lives 5 days a week and I think he is on his buttock all day on these visits. Surrounding the wounds see has extensive stage I injury as well. ooFINALLY it looks as though he has lost weight. I'm not the only staff member that is recognized this 01/08/18 on evaluation today patient appears to be doing better in regard to his gluteal wounds. In fact everything appears to be healed back here although very dry. He has a couple open sores on the scrotal region other than this it is really his foot that is still giving  him the most trouble. He did complete the Keflex unfortunately it seems like there's still a lot of erythema in this in fact looks worse than it did previous. I'm gonna culture this today and likely start with a different antibiotic. 01/16/18; I was reassured that the wounds on his buttocks and sacrum are healed. I did not look at these the day. The culture that was done last week on 01/11/18 of the right lateral foot wounds showed Pseudomonas. Bactrim is not a good choice for this. Ciprofloxacin interacts with his muscle relaxants therefore I prescribed cefdinir 300 twice a day for 7 days. Bactrim coverage of pseudomonas is not reliable although the degree of erythema looks a lot better 01/30/18; the patient has completed his antibiotics. The area does not have infection on the right lateral foot. The wounds look a lot better. We've been using silver alginate 02/12/18; there is on the right lateral foot. Copious amounts of surface eschar. Also similarly over the PIP of the right second toe. We've been using silver alginate to date 03/02/2018; the area that remains is on the right lateral foot. The right second toe is healed. 03/16/2018; the area there is original wound on the right lateral foot. Still requiring debridement. We are using Hydrofera Blue. The right second toe is still healed/closed 03/30/2018; right lateral foot there is 2 small open areas remaining. They actually look quite healthy we have been using Hydrofera Blue the right second toe remains closed 04/13/2018; right lateral foot. 1 of the 2 small open areas from last time has closed over. The other had surface slough that I removed with a #3 curette. This looks quite healthy. The right second toe remains closed. We are using Hydrofera Blue 05/01/2018; right lateral foot very superficial areas that look like they are trying to close over. He had a new threatened area on the left medial ankle just below the medial malleolus. I think these  represent chronic venous insufficiency areas 05/14/2018; the right right lateral foot looks like it closed over as was predicted last time however he has some dark areas subcutaneously erythema superiorly and this is really very tender. This is either coexistent cellulitis or perhaps a deep tissue injury. The area on the left medial ankle is superficial but has some size. We have been using Hydrofera Blue to both areas. The patient complains of pain in his right foot 06/09/2018; right lateral foot was closed over last time but there was coexistent cellulitis around this. I gave him Levaquin. He also has an area on the left medial malleolus. He has not been back to clinic in over 3-1/2 weeks. We are using Hydrofera Blue I think to the left medial malleolus and silver alginate to the right lateral foot 1/21; right lateral foot still non-viable debris over this area and the left medial malleolus. Both of these vigorously washed off  with antiseptic gauze. We have been using silver alginate. 2/4; right lateral foot this looks somewhat better. Unfortunately the area over the left medial malleolus has deteriorated. Using silver alginate in both area 2/18; both wounds arrived today covered in a lot of necrotic debris. We have been using silver alginate. Clearly not making a lot of progress. 3/3; the patient's right second toe is reopened over the PIP. I am not sure why this is happening. He may have microvascular disease. We have checked his macrovascular flow in the past and found it to be normal. He may need an x-ray. The area on the right lateral foot requires debridement. The area on the left lateral ankle is worse. We have been using PolyMem Ag 08/26/18 on evaluation today patient appears to be doing a little worse in regard to his lower extremities based on what I'm seeing what the nursing staff is telling me. His left medial ankle appears potential to be infected there is your thing on want surrounding  the wound. His right second toe is also of concern at this point in my pinion. We have not done x-rays of these areas that may be a good idea to go ahead and proceed with at this point to ensure will not deal with anything such as osteomyelitis. 4/16; it is been a while since I saw this patient. He now has wounds on the right lateral foot, dorsal right second toe, substantially on the left medial malleolus and a small area on the left lateral foot. We have been using PolyMem Silver. Curlex and Coban. He has home health changing the dressing. Since last time he was here he had a culture of the left medial ankle that showed MRSA. He is completed a course/7 days of doxycycline. XRAYS of the left ankle and right foot that were ordered were negative for osteomyelitis 4/30; he arrives in clinic today with some maceration around the wound and a lot of tenderness and erythema in a circumferential area around the right lateral foot. He has wound areas on the right lateral foot dorsal right second toe, largely over the left medial malleolus and a small area over the left lateral foot 5/7; completed his antibiotics from last time right lateral foot cellulitis is better and the area on his left knee which we discovered after he had been seen by the provider also is improved. He still has extensive wound areas over the right lateral foot, left medial malleolus, right second toe and left lateral foot although all of these look somewhat better with PolyMem 5/15. Wounds on the right lateral foot, right second toe left medial malleolus and the left lateral foot. All of these look somewhat better. We have been using PolyMem Ag 5/26; the wound on the right lateral foot is almost closed over but he still complains of a lot of pain in this area. He has a eschared area on the right second toe but no open wound.Geoffry Paradise concerning today his original wound on the left medial malleolus looks healthy however just inferior to  this almost areas of deep tissue injury and there is an area on the left lateral foot also looks like a deep tissue injury. In the past I have wondered whether he might have cholesterol emboli or some other form of a vasculopathy. I wonder whether this could have something to do with this. 6/2; right lateral foot wound is still open I gave him empiric antibiotics last week without really a lot of change she  is still complaining of pain in this area. We x- rayed his feet at some point in April these were negative. It would be difficult to do an MRI. The area on his left medial malleolus looks better. Right second toe is healed. He still has what looks to be a small pressure related area on the left lateral foot but it is not open. We are using PolyMem to all wound which seems to be doing nicely 6/16; right lateral foot wound is still open. The wound looks better although there is still a lot of tenderness around it. Rather than more empiric antibiotics I have cultured this today. We are using PolyMem Ag. On the left lateral malleolus the wound appears better. He has a new area on the left lower anterior pretibial area 6/29; 2-week follow-up. Culture I did of the right lateral foot 2 weeks ago showed MRSA. I gave him 2 weeks of Septra which he should be just about finishing now and this seems better. We got a call from home health last week to report swelling in the left leg because we were listed as his primary. We referred him back to primary care. He arrives today with intense erythema and tenderness around the 2 wounds on the left medial ankle on the left lower calf. The area on the right lateral foot looks better 7/7; I brought him back this week to follow-up on his left foot which had cellulitis last week. I gave him clindamycin which as far as I know he tolerated. The erythema is better. He has a 3 current wounds one on the right lateral foot a small area on the right lateral malleolus and the  more recent area on the left anterior pretibial area. 7/21; bilateral distal lower leg and foot wounds. The area on his right lateral foot looks a lot better. He has an area on his left pretibial area distally. The area on the medial malleolus on the left which is 1 of his worst wounds has closed over. He does not appear to have any open areas in his remaining toes 8/4-Patient returns at 2 weeks, he has the remaining wound on the left lower leg the other 2 wounds have healed. Patient is continued to be treated with silver alginate with a 2 layer wrap on the left 8/18; I been following this patient for a long period for bilateral lower extremity wounds. 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 His wounds are probably pressure although he religiously wears thick bunny boots. He does not have macrovascular disease but I did formal testing on him perhaps 2 or 3 years ago. I have often wondered about either cholesterol emboli or a vasculopathy of some sort given the distal nature of these variable locations etc. He has 2 areas that we have been following one on the left medial lower leg and one on the right lateral foot. Both of these are mostly healed today there is some eschar over sections of both areas although I did not disturb this. 03/31/2019 on evaluation today patient appears to be doing a little bit more poorly in regard to the sacral region. He has 2 small wounds noted at this point. Fortunately there is no signs of active infection at this time. No fevers, chills, nausea, vomiting, or diarrhea. He is having some pain when he sits on his gluteal region a big part of the reason he has these wounds is likely due to the fact  that his wheelchair has not been functioning properly. He does still have a couple areas of deep tissue injury on his lower extremities he does need new Prevalon offloading boots as well. 04/28/2019 on evaluation today patient  appears to be doing well with regard to his wounds in particular. His ankle area has fairly small based on what I am seeing currently and his sacral region actually is still open but does not appear to be doing too poorly in my opinion. Fortunately there is no signs of active infection at this time. He was in the hospital unfortunately and this was from 04/08/2019 through 04/12/2019 secondary to persistent diarrhea, dehydration and acute kidney injury. With that being said fortunately the patient does not seem to be doing too poorly at this time which is good news. He is having pain mainly in the sacral region. 06/23/2019 on evaluation today patient appears to be doing well with regard to his sacral region which in fact is completely healed. Unfortunately he has an area of rubbing/pressure on his right lateral chest/back region. This is where it is rubbing up on his chair. Subsequently he also has an open wound on the left medial malleolus which is not very open but starting to crack and then on the right lateral foot this is much more open at this point. Unfortunately there is no signs of significant improvement in regard to these areas and in fact there does appear to be evidence of active infection quite significantly. No fevers, chills, nausea, vomiting, or diarrhea. The patient did test positive for COVID-19 that has been greater than 14 days ago although they did not know the exact timing. That is why he was not here for his last appointment. 07/07/2019 upon evaluation today patient actually appears to be doing quite well in regard to his left ankle as well as the chest/back region which is doing great in fact appears to be healed in my opinion. He still has an issue on his lateral right foot that is open and appears to be infected but does have me more concerned. In fact I do believe the doxycycline helped in general for the cellulitis but I believe that may have been more MRSA related based on the  culture that we did. What I am seeing right now has more of the blue-green drainage and may be more consistent with a Pseudomonas that I was hoping was not really causing much of an issue but I think it may be at this point on the right lateral foot. For that reason I do think treatment would be a good idea of the reason I avoided the Cipro before was the potential for QT prolongation which could obviously cause problems with the patient with results of interaction with respiratory wound. The tizanidine also had some interactions as well. Nonetheless I think at this point that it would be a possibility for [place] to use topical gentamicin to see if this could be of benefit for the patient underneath the silver alginate dressing. 07/21/2019 upon evaluation today patient appears to be doing fairly well at this point in regard to his lower extremity on the right. He does have a wound on his right lateral foot as well as the right fifth toe and the right fourth toe. Unfortunately he continues to experience significant issues with pressure and trauma to some degree and I think this is just due to the fact that again he is not really not able to control his legs and what  is happening here. Fortunately there is no signs of active infection at this time. 08/04/2019 upon evaluation today patient actually appears to be doing quite well with regard to his toe ulcers I am very pleased in that regard. Unfortunately the foot ulcer is showing some signs of erythema his primary care provider has placed him on clindamycin at this point. Again I do believe this can be beneficial for him. With that being said I do think the patient may benefit from a light Kerlix and Coban wrap to help with some of his edema at this point. 08/18/2019 upon evaluation today patient appears to be doing still poorly in regard to his lateral foot ulcer. This is not very deep but there is some concern about the possibility of osteomyelitis. I think  it would be appropriate for [place] to go ahead and have an x-ray at this time. The patient is currently on clindamycin which from his culture which was performed on 06/23/2019 it does appear that he would benefit from that in regard to the MRSA. However Pseudomonas was also identified then and the patient actually has some blue-green drainage at this point as well. I am much more concerned about the possibility of the Pseudomonas being an infection is causative agent at this point that I am the MRSA. Subsequently Cipro the patient was sensitive to but we never use that is stable use a topical gent due to the fact that unfortunately there was some interaction between the Cipro and the patient's risk for down potentially causing QT prolongation. 08/31/2019; have not seen this patient in quite a period of time. He has a substantial wound on his right lateral foot we have been using gentamicin ointment with Hydrofera Blue over the top. From what I am able to determine he is also on antibiotics perhaps clindamycin although I have not had a chance to look over this. He also has an eschared area on the right fifth toe as well as the right second toe. 4/20; right lateral foot wound. He comes in today with intense erythema and tenderness around this wound. The wound itself has a surface on it but it doesn't look particularly healthy a lot of gelatinous debris. There is no palpable bone. We've been using gentamicin ointment and Hydrofera Blue. Since the patient was last here he was seen by Dr. Algis Liming of infectious disease. He ordered an MRI that I think is due later this month on 4/30. He apparently also checked a sedimentation rate and C-reactive protein metabolic panel CBC with differential. At the time he was seen he wanted to withhold any microbials pending identification of the depth of the infection although looking at this I don't know that I'm going to be able to hold off for another 10 days. 4/29; he went on  to have the MRI that was ordered by Dr. Algis Liming of infectious disease. 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 I had given him cefdinir last week for surrounding cellulitis he is finishing this today. I have communicated with Dr. Algis Liming who wants bone for pathology and culture before proceeding with consideration of IV antibiotics. Fortunately he is due to see Dr. Samuella Cota of podiatry today I am hopeful that the bone biopsy and culture will be considered there 5/4 as I understand things Dr. Samuella Cota actually wants to remove the diseased proximal fifth metatarsal. Looking back at this decision is probably not a bad one. He has good blood flow he is nonambulatory they  are apparently going to do this next week. Paradoxically the wound on the lateral part of his foot is actually a lot better. The cellulitis that was so problematic 2 weeks ago also has resolved. 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 The last time I saw him he had a diseased proximal fifth metatarsal. 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 Samuella Cota. Culture of this showed Pseudomonas and he was followed by infectious disease Dr. Algis Liming treated with ciprofloxacin. 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 He had Integra placed. A CNS showed Pseudomonas again. Since then he has had Silvadene as a primary dressing, then Santyl and more recently Xeroform and then back to Santyl 3 times a week per his attendant who comes with him. Reviewing his records I was really not prepared for the number of wounds that he currently has. This includes substantial areas on the right lateral foot, right lateral ankle, right fourth toe dorsally, right ankle anteriorly as well as the left lateral ankle and foot. His medical  history is unchanged ABIs 1.2 on the right and 1.21 on the left he has not been known to have arterial insufficiency and is not a diabetic 11/5; patient I readmitted to the clinic last week. He has multiple wounds on the right lateral foot, right fourth toe and a new area on the right second toe. He has areas on the right dorsal ankle and a substantive wound over the left lateral ankle and foot. We are using silver collagen. We put him under compression last week and this seems to have helped. He has home health coming out one other time per week to change the dressing Objective Constitutional Patient is hypertensive.. Pulse regular and within target range for patient.Marland Kitchen Respirations regular, non-labored and within target range.. Temperature is normal and within the target range for the patient.Marland Kitchen Appears in no distress. Vitals Time Taken: 9:11 AM, Temperature: 97.5 F, Pulse: 84 bpm, Respiratory Rate: 18 breaths/min, Blood Pressure: 154/89 mmHg. General Notes: Wound exam ooSubstantial number of areas on the right foot and ankle. He required debridement with an open curette of the right lateral foot and ankle wound as well as the left ankle and foot medial wounds. Adherent fibrinous debris. No evidence of surrounding infection Integumentary (Hair, Skin) Wound #40 status is Open. Original cause of wound was Gradually Appeared. The wound is located on the Left,Medial Lower Leg. The wound measures 4cm length x 5.5cm width x 0.1cm depth; 17.279cm^2 area and 1.728cm^3 volume. There is Fat Layer (Subcutaneous Tissue) exposed. There is no tunneling or undermining noted. There is a large amount of serous drainage noted. The wound margin is flat and intact. There is medium (34-66%) red granulation within the wound bed. There is a medium (34-66%) amount of necrotic tissue within the wound bed including Adherent Slough. Wound #41 status is Open. Original cause of wound was Trauma. The wound is located on the  Right T Fourth. The wound measures 1.2cm length x 0.6cm oe width x 0.1cm depth; 0.565cm^2 area and 0.057cm^3 volume. There is Fat Layer (Subcutaneous Tissue) exposed. There is no tunneling or undermining noted. There is a medium amount of serosanguineous drainage noted. The wound margin is flat and intact. There is small (1-33%) pink granulation within the wound bed. There is a  large (67-100%) amount of necrotic tissue within the wound bed including Adherent Slough. Wound #42 status is Open. Original cause of wound was Gradually Appeared. The wound is located on the Right,Medial Malleolus. The wound measures 1.5cm length x 3cm width x 0.1cm depth; 3.534cm^2 area and 0.353cm^3 volume. There is Fat Layer (Subcutaneous Tissue) exposed. There is no tunneling or undermining noted. There is a medium amount of serosanguineous drainage noted. The wound margin is flat and intact. There is medium (34-66%) red granulation within the wound bed. There is a medium (34-66%) amount of necrotic tissue within the wound bed including Adherent Slough. Wound #43 status is Open. Original cause of wound was Pressure Injury. The wound is located on the Right,Distal,Anterior Lower Leg. The wound measures 0.3cm length x 0.4cm width x 0.1cm depth; 0.094cm^2 area and 0.009cm^3 volume. There is Fat Layer (Subcutaneous Tissue) exposed. There is no tunneling or undermining noted. There is a medium amount of serosanguineous drainage noted. The wound margin is flat and intact. There is medium (34-66%) red granulation within the wound bed. There is a medium (34-66%) amount of necrotic tissue within the wound bed including Adherent Slough. Wound #44 status is Open. Original cause of wound was Pressure Injury. The wound is located on the Right,Proximal,Lateral Foot. The wound measures 3cm length x 1.8cm width x 0.1cm depth; 4.241cm^2 area and 0.424cm^3 volume. There is Fat Layer (Subcutaneous Tissue) exposed. There is no tunneling  or undermining noted. There is a large amount of serosanguineous drainage noted. The wound margin is flat and intact. There is medium (34-66%) granulation within the wound bed. There is a medium (34-66%) amount of necrotic tissue within the wound bed including Adherent Slough. Wound #45 status is Open. Original cause of wound was Pressure Injury. The wound is located on the Right,Distal,Lateral Foot. The wound measures 1.8cm length x 1.7cm width x 0.1cm depth; 2.403cm^2 area and 0.24cm^3 volume. There is Fat Layer (Subcutaneous Tissue) exposed. There is a medium amount of serosanguineous drainage noted. The wound margin is flat and intact. There is no granulation within the wound bed. There is a large (67-100%) amount of necrotic tissue within the wound bed including Eschar and Adherent Slough. Wound #46 status is Open. Original cause of wound was Gradually Appeared. The wound is located on the Right T Second. The wound measures 0.7cm oe length x 1.1cm width x 0.1cm depth; 0.605cm^2 area and 0.06cm^3 volume. There is Fat Layer (Subcutaneous Tissue) exposed. There is no tunneling or undermining noted. There is a medium amount of serosanguineous drainage noted. The wound margin is flat and intact. There is medium (34-66%) pink granulation within the wound bed. There is a medium (34-66%) amount of necrotic tissue within the wound bed including Adherent Slough. 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 There was a Excisional Skin/Subcutaneous Tissue Debridement with a total area of 22 sq cm performed by Maxwell Caul., MD. With the following instrument(s): Curette to remove Viable and Non-Viable tissue/material. Material removed includes Subcutaneous Tissue and Slough and after achieving pain control using  Other (benzocaine 20 % spray). No specimens were taken. A time out was conducted at 09:50, prior to the start of the procedure. A Minimum amount of bleeding was controlled with Pressure. The procedure was tolerated well with a pain level of 5 throughout and a pain level of 2 following the procedure. Post Debridement Measurements: 4cm length x 5.5cm width x 0.1cm depth; 1.728cm^3 volume. Character of Wound/Ulcer Post Debridement is improved. Severity of Tissue Post Debridement is: Fat layer exposed. Post procedure Diagnosis Wound #40: Same as Pre-Procedure Pre-procedure diagnosis of Wound #40 is a Venous Leg Ulcer located on the Left,Medial Lower Leg . There was a Three Layer Compression Therapy Procedure by [name] Stall, RN. Post procedure Diagnosis Wound #40: Same as Pre-Procedure Wound #42 Pre-procedure diagnosis of Wound #42 is a Venous Leg Ulcer located on the Right,Medial Malleolus .Severity of Tissue Pre Debridement is: Fat layer exposed. There was a Excisional Skin/Subcutaneous Tissue Debridement with a total area of 4.5 sq cm performed by Maxwell Caul., MD. With the following instrument(s): Curette to remove Viable and Non-Viable tissue/material. Material removed includes Subcutaneous Tissue and Slough and after achieving pain control using Other (benzocaine 20 % spray). No specimens were taken. A time out was conducted at 09:50, prior to the start of the procedure. A Minimum amount of bleeding was controlled with Pressure. The procedure was tolerated well with a pain level of 5 throughout and a pain level of 2 following  the procedure. Post Debridement Measurements: 1.5cm length x 3cm width x 0.1cm depth; 0.353cm^3 volume. Character of Wound/Ulcer Post Debridement is improved. Severity of Tissue Post Debridement is: Fat layer exposed. Post procedure Diagnosis Wound #42: Same as Pre-Procedure Pre-procedure diagnosis of Wound #42 is a Venous Leg Ulcer located on the Right,Medial Malleolus . There was a Three Layer Compression Therapy Procedure by [name] Stall, RN. Post procedure Diagnosis Wound #42: Same as Pre-Procedure Wound #44 Pre-procedure diagnosis of Wound #44 is a Pressure Ulcer located on the Right,Proximal,Lateral Foot . There was a Excisional Skin/Subcutaneous Tissue Debridement with a total area of 5.4 sq cm performed by Maxwell Caul., MD. With the following instrument(s): Curette to remove Viable and Non-Viable tissue/material. Material removed includes Subcutaneous Tissue and Slough and after achieving pain control using Other (benzocaine 20 % spray). No specimens were taken. A time out was conducted at 09:50, prior to the start of the procedure. A Minimum amount of bleeding was controlled with Pressure. The procedure was tolerated well with a pain level of 5 throughout and a pain level of 2 following the procedure. Post Debridement Measurements: 3cm length x 1.8cm width x 0.1cm depth; 0.424cm^3 volume. Post debridement Stage noted as Unstageable/Unclassified. Character of Wound/Ulcer Post Debridement is improved. Post procedure Diagnosis Wound #44: Same as Pre-Procedure Wound #45 Pre-procedure diagnosis of Wound #45 is a Pressure Ulcer located on the Right,Distal,Lateral Foot . There was a Excisional Skin/Subcutaneous Tissue Debridement with a total area of 3.06 sq cm performed by Maxwell Caul., MD. With the following instrument(s): Curette Material removed includes Subcutaneous Tissue and Slough and after achieving pain control using Other (benzocaine 20 % spray). No specimens were taken.  A time out was conducted at 09:50, prior to the start of the procedure. A Minimum amount of bleeding was controlled with Pressure. The procedure was tolerated well with a pain level of 5 throughout and a pain level of 2 following the procedure. Post Debridement Measurements: 1.8cm length x 1.7cm width x 0.1cm depth; 0.24cm^3 volume. Post debridement Stage  noted as Unstageable/Unclassified. Character of Wound/Ulcer Post Debridement is improved. Post procedure Diagnosis Wound #45: Same as Pre-Procedure Plan 1. I am continuing with silver collagen to all the wound areas. Perhaps Iodoflex or Hydrofera Blue in reserve 2. I see no evidence of infection 3. The patient has chronic venous insufficiency and lymphedema he will need to be kept in compression wraps 4. He does not have arterial issues based on ABIs in our clinic and clinical exam do not show vibrant pulses bilaterally. 5. No evidence of current infection which is gratifying. If he does develop a very significant infection however he may be at risk of amputations Electronic Signature(s) Signed: 04/03/2020 1:46:42 PM By: Baltazar Najjar MD Entered By: Baltazar Najjar on 03/31/2020 10:00:15 -------------------------------------------------------------------------------- SuperBill Details Patient Name: Date of Service: [name] 03/31/2020 Medical Record Number: [id number] Patient Account Number: [id number] Date of Birth/Sex: Treating RN: 12/05/1960 (59 y.o. [name] Primary Care Provider: [name] Other Clinician: Referring Provider: Treating Provider/Extender: [name], [name] in Treatment: 1 Diagnosis Coding ICD-10 Codes Code Description I89.0 Lymphedema, not elsewhere classified T81.31XD Disruption of external operation (surgical) wound, not elsewhere classified, subsequent encounter S90.811D Abrasion, right foot, subsequent encounter L97.512 Non-pressure chronic ulcer of other part  of right foot with fat layer exposed L97.312 Non-pressure chronic ulcer of right 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 L97.322 Non-pressure chronic ulcer of left 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 Facility Procedures CPT4 Code: [id number] Description: 11042 - DEB SUBQ TISSUE 20 SQ CM/< ICD-10 Diagnosis Description T81.31XD Disruption of external operation (surgical) wound, not elsewhere classified, subs L97.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 layer exposed  L97.312 Non-pressure chronic ulcer of right 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 L97.322 Non-pressure chronic ulcer of left 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 Modifier: equent encounter Quantity: 1 CPT4 Code: [id number] Description: 11045 - DEB SUBQ TISS EA ADDL 20CM ICD-10 Diagnosis Description T81.31XD Disruption of external operation (surgical) wound, not elsewhere classified, subs L97.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 layer exposed  L97.312 Non-pressure chronic ulcer of right 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 L97.322 Non-pressure chronic ulcer of left 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 Modifier: equent encounter Quantity: 1 Physician Procedures : CPT4 Code Description Modifier [id number] 11042 - WC PHYS SUBQ TISS 20 SQ CM ICD-10 Diagnosis Description T81.31XD Disruption of external operation (surgical) wound, not elsewhere classified, subsequent encounter L97.512 Non-pressure chronic ulcer of  other part of right foot with fat layer exposed L97.312 Non-pressure chronic ulcer of right 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 L97.322 Non-pressure chronic ulcer of left 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 Quantity: 1 : [id number] 11045 - WC PHYS SUBQ TISS EA ADDL 20 CM ICD-10 Diagnosis Description T81.31XD Disruption of external operation (surgical) wound, not elsewhere classified, subsequent encounter L97.512 Non-pressure chronic ulcer of other part of right foot with  fat layer exposed L97.312 Non-pressure chronic ulcer of right ankle with fat  layer exposed L97.322 Non-pressure chronic ulcer of left 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

## 2020-04-07 ENCOUNTER — Other Ambulatory Visit: Payer: Self-pay

## 2020-04-07 ENCOUNTER — Encounter (HOSPITAL_BASED_OUTPATIENT_CLINIC_OR_DEPARTMENT_OTHER): Payer: Medicare Other | Admitting: Internal Medicine

## 2020-04-07 DIAGNOSIS — I890 Lymphedema, not elsewhere classified: Secondary | ICD-10-CM | POA: Diagnosis not present

## 2020-04-10 NOTE — Progress Notes (Signed)
Thickness Without Exposed Support Structures Wound Margin: Flat and Intact Exudate Amount: Medium Exudate Type: Serosanguineous Exudate Color: red, brown Foul Odor After Cleansing: No Slough/Fibrino Yes Wound Bed Granulation Amount: Medium (34-66%) Exposed Structure Granulation Quality: Pink, Friable Fascia Exposed: No Necrotic Amount: Medium (34-66%) Fat Layer (Subcutaneous Tissue) Exposed: Yes Necrotic Quality: Adherent Slough Tendon Exposed: No Muscle Exposed: No Joint Exposed: No Bone Exposed: No Treatment Notes Wound #42 (Right, Medial Malleolus) 1. Cleanse With Wound Cleanser Soap and water 2. Periwound Care Moisturizing lotion 3. Primary Dressing  Applied Iodoflex 4. Secondary Dressing ABD Pad Dry Gauze 6. Support Layer Applied Kerlix/Coban Notes netting. bunny boots to BLE. Electronic Signature(s) Signed: 04/07/2020 5:42:44 PM By: [name] RN Signed: 04/07/2020 5:58:22 PM By: Zenaida Deed RN, BSN Entered By: Zenaida Deed on 04/07/2020 09:31:38 -------------------------------------------------------------------------------- Wound Assessment Details Patient Name: Date of Service: [name]. 04/07/2020 8:30 A M Medical Record Number: [id number] Patient Account Number: [id number] Date of Birth/Sex: Treating RN: 1960-06-20 (59 y.o. [name]) [name] Primary Care Prospero Mahnke: [name] Other Clinician: Referring Talayia Hjort: Treating Luka Stohr/Extender: [name], [name] in Treatment: 2 Wound Status Wound Number: 43 Primary Pressure Ulcer Etiology: Wound Location: Right, Distal, Anterior Lower Leg Secondary Venous Leg Ulcer Wounding Event: Pressure Injury Etiology: Date Acquired: 02/21/2020 Wound Open Weeks Of Treatment: 2 Status: Clustered Wound: Yes Comorbid Anemia, Deep Vein Thrombosis, Colitis, Osteomyelitis, History: Neuropathy, Quadriplegia, Confinement Anxiety Wound Measurements Length: (cm) Width: (cm) Depth: (cm) Clustered Quantity: Area: (cm) Volume: (cm) 3.5 % Reduction in Area: -100.1% 1 % Reduction in Volume: 0% 0.1 Epithelialization: Small (1-33%) 2 Tunneling: No 2.749 Undermining: No 0.275 Wound Description Classification: Category/Stage III Wound Margin: Flat and Intact Exudate Amount: Small Exudate Type: Serosanguineous Exudate Color: red, brown Foul Odor After Cleansing: No Slough/Fibrino Yes Wound Bed Granulation Amount: Small (1-33%) Exposed Structure Granulation Quality: Red Fascia Exposed: No Necrotic Amount: Large (67-100%) Fat Layer (Subcutaneous Tissue) Exposed: Yes Necrotic Quality: Eschar, Adherent Slough Tendon Exposed: No Muscle  Exposed: No Joint Exposed: No Bone Exposed: No Treatment Notes Wound #43 (Right, Distal, Anterior Lower Leg) 1. Cleanse With Wound Cleanser Soap and water 2. Periwound Care Moisturizing lotion 3. Primary Dressing Applied Iodoflex 4. Secondary Dressing ABD Pad Dry Gauze 6. Support Layer Applied Kerlix/Coban Notes netting. bunny boots to BLE. Electronic Signature(s) Signed: 04/07/2020 5:42:44 PM By: [name] RN Signed: 04/07/2020 5:58:22 PM By: Zenaida Deed RN, BSN Signed: 04/07/2020 5:58:22 PM By: Zenaida Deed RN, BSN Entered By: Zenaida Deed on 04/07/2020 09:32:22 -------------------------------------------------------------------------------- Wound Assessment Details Patient Name: Date of Service: [name], [name] 04/07/2020 8:30 A M Medical Record Number: [id number] Patient Account Number: [id number] Date of Birth/Sex: Treating RN: June 24, 1960 (59 y.o. [name] Primary Care Tymika Grilli: [name] Other Clinician: Referring Dexter Sauser: Treating Haidyn Kilburg/Extender: [name], [name] in Treatment: 2 Wound Status Wound Number: 44 Primary Pressure Ulcer Etiology: Wound Location: Right, Proximal, Lateral Foot Wound Open Wounding Event: Pressure Injury Status: Date Acquired: 02/21/2020 Comorbid Anemia, Deep Vein Thrombosis, Colitis, Osteomyelitis, Weeks Of Treatment: 2 History: Neuropathy, Quadriplegia, Confinement Anxiety Clustered Wound: No Wound Measurements Length: (cm) 5.4 Width: (cm) 2 Depth: (cm) 0.3 Area: (cm) 8.482 Volume: (cm) 2.545 % Reduction in Area: -12.4% % Reduction in Volume: -12.4% Epithelialization: None Tunneling: No Undermining: No Wound Description Classification: Unstageable/Unclassified Wound Margin: Flat and Intact Exudate Amount: Large Exudate Type: Serosanguineous Exudate Color: red, brown Foul Odor After Cleansing: No Slough/Fibrino Yes Wound Bed Granulation Amount: Small (1-33%)  Exposed Structure Necrotic Amount: Large (67-100%) Fascia Exposed:  Wound Imaging (photographs - any number of wounds) []  - 0 Wound Tracing (instead of photographs) []  - 0 Simple Wound Measurement - one wound X- 6 5 Complex Wound Measurement - multiple wounds INTERVENTIONS - Wound Dressings X - Small Wound Dressing one or multiple wounds 6 10 []  - 0 Medium Wound Dressing one or multiple wounds []  - 0 Large Wound Dressing one or multiple wounds X- 1 5 Application of Medications - topical []  - 0 Application of Medications - injection INTERVENTIONS - Miscellaneous []  - 0 External ear exam []  - 0 Specimen Collection (cultures, biopsies, blood, body fluids, etc.) []  - 0 Specimen(s) / Culture(s) sent or taken to Lab for analysis []  - 0 Patient Transfer (multiple staff / Nurse, adultHoyer Lift / Similar devices) []  - 0 Simple Staple / Suture removal (25 or less) []  - 0 Complex Staple / Suture removal (26 or more) []  - 0 Hypo / Hyperglycemic Management (close monitor of Blood Glucose) []  - 0 Ankle / Brachial Index (ABI) - do not check if billed separately X- 1 5 Vital Signs Has the patient been seen at the hospital within the last three years: Yes Total Score: 240 Level Of Care: New/Established - Level 5 Electronic Signature(s) Signed: 04/07/2020 5:58:22 PM By: Zenaida DeedBoehlein, Linda RN, BSN Entered By: Zenaida DeedBoehlein, Linda on 04/07/2020 09:46:56 -------------------------------------------------------------------------------- Encounter Discharge Information Details Patient Name: Date of Service: [name] RhineHUMBLE, [name] 04/07/2020 8:30 A M Medical Record Number: [id number] Patient Account Number: [id number] Date of Birth/Sex: Treating RN: Aug 03, 1960 (59 y.o. Tammy SoursM) Deaton, Bobbi Primary Care Levie Owensby: Jamison OkaBakare, Mobolaji Other Clinician: Referring Ellanor Feuerstein: Treating  Jahmal Dunavant/Extender: Alethia Bertholdobson, Michael Bakare, [name] in Treatment: 2 Encounter Discharge Information Items Discharge Condition: Stable Ambulatory Status: Walker Discharge Destination: Home Transportation: Private Auto Accompanied By: self Schedule Follow-up Appointment: Yes Clinical Summary of Care: Electronic Signature(s) Signed: 04/07/2020 5:09:42 PM By: Shawn Stalleaton, Bobbi Entered By: Shawn Stalleaton, Bobbi on 04/07/2020 12:25:06 -------------------------------------------------------------------------------- Lower Extremity Assessment Details Patient Name: Date of Service: [name] RhineHUMBLE, [name] 04/07/2020 8:30 A M Medical Record Number: [id number] Patient Account Number: [id number] Date of Birth/Sex: Treating RN: Aug 03, 1960 (59 y.o. [name] PetitM) Yevonne PaxEpps, Carrie Primary Care Doroteo Nickolson: Jamison OkaBakare, Mobolaji Other Clinician: Referring Lovina Zuver: Treating Timmi Devora/Extender: Alethia Bertholdobson, Michael Bakare, [name] in Treatment: 2 Edema Assessment Assessed: [Left: No] [Right: No] Edema: [Left: Yes] [Right: Yes] Calf Left: Right: Point of Measurement: From Medial Instep 32 cm 26 cm Ankle Left: Right: Point of Measurement: From Medial Instep 18 cm 18 cm Electronic Signature(s) Signed: 04/07/2020 5:42:44 PM By: Yevonne PaxEpps, Carrie RN Entered By: Yevonne PaxEpps, Carrie on 04/07/2020 09:04:16 -------------------------------------------------------------------------------- Multi Wound Chart Details Patient Name: Date of Service: [name] RhineHUMBLE, [name] 04/07/2020 8:30 A M Medical Record Number: [id number] Patient Account Number: [id number] Date of Birth/Sex: Treating RN: Aug 03, 1960 (59 y.o. Damaris SchoonerM) Boehlein, Linda Primary Care Daniell Paradise: Jamison OkaBakare, Mobolaji Other Clinician: Referring Kendalyn Cranfield: Treating Alvilda Mckenna/Extender: Alethia Bertholdobson, Michael Bakare, [name] in Treatment: 2 Vital Signs Height(in): Pulse(bpm): 82 Weight(lbs): Blood Pressure(mmHg): 125/79 Body Mass Index(BMI): Temperature(F): 98.2 Respiratory Rate(breaths/min):  18 Photos: [40:No Photos Left, Medial Lower Leg] [41:No Photos Right T Fourth oe] [42:No Photos Right, Medial Malleolus] Wound Location: [40:Gradually Appeared] [41:Trauma] [42:Gradually Appeared] Wounding Event: [40:Venous Leg Ulcer] [41:Lymphedema] [42:Venous Leg Ulcer] Primary Etiology: [40:N/A] [41:N/A] [42:N/A] Secondary Etiology: [40:Anemia, Deep Vein Thrombosis,] [41:Anemia, Deep Vein Thrombosis,] [42:Anemia, Deep Vein Thrombosis,] Comorbid History: [40:Colitis, Osteomyelitis, Neuropathy, Colitis, Osteomyelitis, Neuropathy, Colitis, Osteomyelitis, Neuropathy, Quadriplegia, Confinement Anxiety 02/21/2020] [41:Quadriplegia, Confinement Anxiety 02/21/2020] [42:Quadriplegia, Confinement  Anxiety 02/21/2020] Date Acquired: [40:2] [41:2] [42:2] Weeks of Treatment: [40:Open] [41:Open] [42:Open] Wound Status: [40:No] [  Wound Imaging (photographs - any number of wounds) []  - 0 Wound Tracing (instead of photographs) []  - 0 Simple Wound Measurement - one wound X- 6 5 Complex Wound Measurement - multiple wounds INTERVENTIONS - Wound Dressings X - Small Wound Dressing one or multiple wounds 6 10 []  - 0 Medium Wound Dressing one or multiple wounds []  - 0 Large Wound Dressing one or multiple wounds X- 1 5 Application of Medications - topical []  - 0 Application of Medications - injection INTERVENTIONS - Miscellaneous []  - 0 External ear exam []  - 0 Specimen Collection (cultures, biopsies, blood, body fluids, etc.) []  - 0 Specimen(s) / Culture(s) sent or taken to Lab for analysis []  - 0 Patient Transfer (multiple staff / Nurse, adultHoyer Lift / Similar devices) []  - 0 Simple Staple / Suture removal (25 or less) []  - 0 Complex Staple / Suture removal (26 or more) []  - 0 Hypo / Hyperglycemic Management (close monitor of Blood Glucose) []  - 0 Ankle / Brachial Index (ABI) - do not check if billed separately X- 1 5 Vital Signs Has the patient been seen at the hospital within the last three years: Yes Total Score: 240 Level Of Care: New/Established - Level 5 Electronic Signature(s) Signed: 04/07/2020 5:58:22 PM By: Zenaida DeedBoehlein, Linda RN, BSN Entered By: Zenaida DeedBoehlein, Linda on 04/07/2020 09:46:56 -------------------------------------------------------------------------------- Encounter Discharge Information Details Patient Name: Date of Service: [name] RhineHUMBLE, [name] 04/07/2020 8:30 A M Medical Record Number: [id number] Patient Account Number: [id number] Date of Birth/Sex: Treating RN: Aug 03, 1960 (59 y.o. Tammy SoursM) Deaton, Bobbi Primary Care Levie Owensby: Jamison OkaBakare, Mobolaji Other Clinician: Referring Ellanor Feuerstein: Treating  Jahmal Dunavant/Extender: Alethia Bertholdobson, Michael Bakare, [name] in Treatment: 2 Encounter Discharge Information Items Discharge Condition: Stable Ambulatory Status: Walker Discharge Destination: Home Transportation: Private Auto Accompanied By: self Schedule Follow-up Appointment: Yes Clinical Summary of Care: Electronic Signature(s) Signed: 04/07/2020 5:09:42 PM By: Shawn Stalleaton, Bobbi Entered By: Shawn Stalleaton, Bobbi on 04/07/2020 12:25:06 -------------------------------------------------------------------------------- Lower Extremity Assessment Details Patient Name: Date of Service: [name] RhineHUMBLE, [name] 04/07/2020 8:30 A M Medical Record Number: [id number] Patient Account Number: [id number] Date of Birth/Sex: Treating RN: Aug 03, 1960 (59 y.o. [name] PetitM) Yevonne PaxEpps, Carrie Primary Care Doroteo Nickolson: Jamison OkaBakare, Mobolaji Other Clinician: Referring Lovina Zuver: Treating Timmi Devora/Extender: Alethia Bertholdobson, Michael Bakare, [name] in Treatment: 2 Edema Assessment Assessed: [Left: No] [Right: No] Edema: [Left: Yes] [Right: Yes] Calf Left: Right: Point of Measurement: From Medial Instep 32 cm 26 cm Ankle Left: Right: Point of Measurement: From Medial Instep 18 cm 18 cm Electronic Signature(s) Signed: 04/07/2020 5:42:44 PM By: Yevonne PaxEpps, Carrie RN Entered By: Yevonne PaxEpps, Carrie on 04/07/2020 09:04:16 -------------------------------------------------------------------------------- Multi Wound Chart Details Patient Name: Date of Service: [name] RhineHUMBLE, [name] 04/07/2020 8:30 A M Medical Record Number: [id number] Patient Account Number: [id number] Date of Birth/Sex: Treating RN: Aug 03, 1960 (59 y.o. Damaris SchoonerM) Boehlein, Linda Primary Care Daniell Paradise: Jamison OkaBakare, Mobolaji Other Clinician: Referring Kendalyn Cranfield: Treating Alvilda Mckenna/Extender: Alethia Bertholdobson, Michael Bakare, [name] in Treatment: 2 Vital Signs Height(in): Pulse(bpm): 82 Weight(lbs): Blood Pressure(mmHg): 125/79 Body Mass Index(BMI): Temperature(F): 98.2 Respiratory Rate(breaths/min):  18 Photos: [40:No Photos Left, Medial Lower Leg] [41:No Photos Right T Fourth oe] [42:No Photos Right, Medial Malleolus] Wound Location: [40:Gradually Appeared] [41:Trauma] [42:Gradually Appeared] Wounding Event: [40:Venous Leg Ulcer] [41:Lymphedema] [42:Venous Leg Ulcer] Primary Etiology: [40:N/A] [41:N/A] [42:N/A] Secondary Etiology: [40:Anemia, Deep Vein Thrombosis,] [41:Anemia, Deep Vein Thrombosis,] [42:Anemia, Deep Vein Thrombosis,] Comorbid History: [40:Colitis, Osteomyelitis, Neuropathy, Colitis, Osteomyelitis, Neuropathy, Colitis, Osteomyelitis, Neuropathy, Quadriplegia, Confinement Anxiety 02/21/2020] [41:Quadriplegia, Confinement Anxiety 02/21/2020] [42:Quadriplegia, Confinement  Anxiety 02/21/2020] Date Acquired: [40:2] [41:2] [42:2] Weeks of Treatment: [40:Open] [41:Open] [42:Open] Wound Status: [40:No] [  Wound Imaging (photographs - any number of wounds) []  - 0 Wound Tracing (instead of photographs) []  - 0 Simple Wound Measurement - one wound X- 6 5 Complex Wound Measurement - multiple wounds INTERVENTIONS - Wound Dressings X - Small Wound Dressing one or multiple wounds 6 10 []  - 0 Medium Wound Dressing one or multiple wounds []  - 0 Large Wound Dressing one or multiple wounds X- 1 5 Application of Medications - topical []  - 0 Application of Medications - injection INTERVENTIONS - Miscellaneous []  - 0 External ear exam []  - 0 Specimen Collection (cultures, biopsies, blood, body fluids, etc.) []  - 0 Specimen(s) / Culture(s) sent or taken to Lab for analysis []  - 0 Patient Transfer (multiple staff / Nurse, adultHoyer Lift / Similar devices) []  - 0 Simple Staple / Suture removal (25 or less) []  - 0 Complex Staple / Suture removal (26 or more) []  - 0 Hypo / Hyperglycemic Management (close monitor of Blood Glucose) []  - 0 Ankle / Brachial Index (ABI) - do not check if billed separately X- 1 5 Vital Signs Has the patient been seen at the hospital within the last three years: Yes Total Score: 240 Level Of Care: New/Established - Level 5 Electronic Signature(s) Signed: 04/07/2020 5:58:22 PM By: Zenaida DeedBoehlein, Linda RN, BSN Entered By: Zenaida DeedBoehlein, Linda on 04/07/2020 09:46:56 -------------------------------------------------------------------------------- Encounter Discharge Information Details Patient Name: Date of Service: [name] RhineHUMBLE, [name] 04/07/2020 8:30 A M Medical Record Number: [id number] Patient Account Number: [id number] Date of Birth/Sex: Treating RN: Aug 03, 1960 (59 y.o. Tammy SoursM) Deaton, Bobbi Primary Care Levie Owensby: Jamison OkaBakare, Mobolaji Other Clinician: Referring Ellanor Feuerstein: Treating  Jahmal Dunavant/Extender: Alethia Bertholdobson, Michael Bakare, [name] in Treatment: 2 Encounter Discharge Information Items Discharge Condition: Stable Ambulatory Status: Walker Discharge Destination: Home Transportation: Private Auto Accompanied By: self Schedule Follow-up Appointment: Yes Clinical Summary of Care: Electronic Signature(s) Signed: 04/07/2020 5:09:42 PM By: Shawn Stalleaton, Bobbi Entered By: Shawn Stalleaton, Bobbi on 04/07/2020 12:25:06 -------------------------------------------------------------------------------- Lower Extremity Assessment Details Patient Name: Date of Service: [name] RhineHUMBLE, [name] 04/07/2020 8:30 A M Medical Record Number: [id number] Patient Account Number: [id number] Date of Birth/Sex: Treating RN: Aug 03, 1960 (59 y.o. [name] PetitM) Yevonne PaxEpps, Carrie Primary Care Doroteo Nickolson: Jamison OkaBakare, Mobolaji Other Clinician: Referring Lovina Zuver: Treating Timmi Devora/Extender: Alethia Bertholdobson, Michael Bakare, [name] in Treatment: 2 Edema Assessment Assessed: [Left: No] [Right: No] Edema: [Left: Yes] [Right: Yes] Calf Left: Right: Point of Measurement: From Medial Instep 32 cm 26 cm Ankle Left: Right: Point of Measurement: From Medial Instep 18 cm 18 cm Electronic Signature(s) Signed: 04/07/2020 5:42:44 PM By: Yevonne PaxEpps, Carrie RN Entered By: Yevonne PaxEpps, Carrie on 04/07/2020 09:04:16 -------------------------------------------------------------------------------- Multi Wound Chart Details Patient Name: Date of Service: [name] RhineHUMBLE, [name] 04/07/2020 8:30 A M Medical Record Number: [id number] Patient Account Number: [id number] Date of Birth/Sex: Treating RN: Aug 03, 1960 (59 y.o. Damaris SchoonerM) Boehlein, Linda Primary Care Daniell Paradise: Jamison OkaBakare, Mobolaji Other Clinician: Referring Kendalyn Cranfield: Treating Alvilda Mckenna/Extender: Alethia Bertholdobson, Michael Bakare, [name] in Treatment: 2 Vital Signs Height(in): Pulse(bpm): 82 Weight(lbs): Blood Pressure(mmHg): 125/79 Body Mass Index(BMI): Temperature(F): 98.2 Respiratory Rate(breaths/min):  18 Photos: [40:No Photos Left, Medial Lower Leg] [41:No Photos Right T Fourth oe] [42:No Photos Right, Medial Malleolus] Wound Location: [40:Gradually Appeared] [41:Trauma] [42:Gradually Appeared] Wounding Event: [40:Venous Leg Ulcer] [41:Lymphedema] [42:Venous Leg Ulcer] Primary Etiology: [40:N/A] [41:N/A] [42:N/A] Secondary Etiology: [40:Anemia, Deep Vein Thrombosis,] [41:Anemia, Deep Vein Thrombosis,] [42:Anemia, Deep Vein Thrombosis,] Comorbid History: [40:Colitis, Osteomyelitis, Neuropathy, Colitis, Osteomyelitis, Neuropathy, Colitis, Osteomyelitis, Neuropathy, Quadriplegia, Confinement Anxiety 02/21/2020] [41:Quadriplegia, Confinement Anxiety 02/21/2020] [42:Quadriplegia, Confinement  Anxiety 02/21/2020] Date Acquired: [40:2] [41:2] [42:2] Weeks of Treatment: [40:Open] [41:Open] [42:Open] Wound Status: [40:No] [  Thickness Without Exposed Support Structures Wound Margin: Flat and Intact Exudate Amount: Medium Exudate Type: Serosanguineous Exudate Color: red, brown Foul Odor After Cleansing: No Slough/Fibrino Yes Wound Bed Granulation Amount: Medium (34-66%) Exposed Structure Granulation Quality: Pink, Friable Fascia Exposed: No Necrotic Amount: Medium (34-66%) Fat Layer (Subcutaneous Tissue) Exposed: Yes Necrotic Quality: Adherent Slough Tendon Exposed: No Muscle Exposed: No Joint Exposed: No Bone Exposed: No Treatment Notes Wound #42 (Right, Medial Malleolus) 1. Cleanse With Wound Cleanser Soap and water 2. Periwound Care Moisturizing lotion 3. Primary Dressing  Applied Iodoflex 4. Secondary Dressing ABD Pad Dry Gauze 6. Support Layer Applied Kerlix/Coban Notes netting. bunny boots to BLE. Electronic Signature(s) Signed: 04/07/2020 5:42:44 PM By: [name] RN Signed: 04/07/2020 5:58:22 PM By: Zenaida Deed RN, BSN Entered By: Zenaida Deed on 04/07/2020 09:31:38 -------------------------------------------------------------------------------- Wound Assessment Details Patient Name: Date of Service: [name]. 04/07/2020 8:30 A M Medical Record Number: [id number] Patient Account Number: [id number] Date of Birth/Sex: Treating RN: 1960-06-20 (59 y.o. [name]) [name] Primary Care Prospero Mahnke: [name] Other Clinician: Referring Talayia Hjort: Treating Luka Stohr/Extender: [name], [name] in Treatment: 2 Wound Status Wound Number: 43 Primary Pressure Ulcer Etiology: Wound Location: Right, Distal, Anterior Lower Leg Secondary Venous Leg Ulcer Wounding Event: Pressure Injury Etiology: Date Acquired: 02/21/2020 Wound Open Weeks Of Treatment: 2 Status: Clustered Wound: Yes Comorbid Anemia, Deep Vein Thrombosis, Colitis, Osteomyelitis, History: Neuropathy, Quadriplegia, Confinement Anxiety Wound Measurements Length: (cm) Width: (cm) Depth: (cm) Clustered Quantity: Area: (cm) Volume: (cm) 3.5 % Reduction in Area: -100.1% 1 % Reduction in Volume: 0% 0.1 Epithelialization: Small (1-33%) 2 Tunneling: No 2.749 Undermining: No 0.275 Wound Description Classification: Category/Stage III Wound Margin: Flat and Intact Exudate Amount: Small Exudate Type: Serosanguineous Exudate Color: red, brown Foul Odor After Cleansing: No Slough/Fibrino Yes Wound Bed Granulation Amount: Small (1-33%) Exposed Structure Granulation Quality: Red Fascia Exposed: No Necrotic Amount: Large (67-100%) Fat Layer (Subcutaneous Tissue) Exposed: Yes Necrotic Quality: Eschar, Adherent Slough Tendon Exposed: No Muscle  Exposed: No Joint Exposed: No Bone Exposed: No Treatment Notes Wound #43 (Right, Distal, Anterior Lower Leg) 1. Cleanse With Wound Cleanser Soap and water 2. Periwound Care Moisturizing lotion 3. Primary Dressing Applied Iodoflex 4. Secondary Dressing ABD Pad Dry Gauze 6. Support Layer Applied Kerlix/Coban Notes netting. bunny boots to BLE. Electronic Signature(s) Signed: 04/07/2020 5:42:44 PM By: [name] RN Signed: 04/07/2020 5:58:22 PM By: Zenaida Deed RN, BSN Signed: 04/07/2020 5:58:22 PM By: Zenaida Deed RN, BSN Entered By: Zenaida Deed on 04/07/2020 09:32:22 -------------------------------------------------------------------------------- Wound Assessment Details Patient Name: Date of Service: [name], [name] 04/07/2020 8:30 A M Medical Record Number: [id number] Patient Account Number: [id number] Date of Birth/Sex: Treating RN: June 24, 1960 (59 y.o. [name] Primary Care Tymika Grilli: [name] Other Clinician: Referring Dexter Sauser: Treating Haidyn Kilburg/Extender: [name], [name] in Treatment: 2 Wound Status Wound Number: 44 Primary Pressure Ulcer Etiology: Wound Location: Right, Proximal, Lateral Foot Wound Open Wounding Event: Pressure Injury Status: Date Acquired: 02/21/2020 Comorbid Anemia, Deep Vein Thrombosis, Colitis, Osteomyelitis, Weeks Of Treatment: 2 History: Neuropathy, Quadriplegia, Confinement Anxiety Clustered Wound: No Wound Measurements Length: (cm) 5.4 Width: (cm) 2 Depth: (cm) 0.3 Area: (cm) 8.482 Volume: (cm) 2.545 % Reduction in Area: -12.4% % Reduction in Volume: -12.4% Epithelialization: None Tunneling: No Undermining: No Wound Description Classification: Unstageable/Unclassified Wound Margin: Flat and Intact Exudate Amount: Large Exudate Type: Serosanguineous Exudate Color: red, brown Foul Odor After Cleansing: No Slough/Fibrino Yes Wound Bed Granulation Amount: Small (1-33%)  Exposed Structure Necrotic Amount: Large (67-100%) Fascia Exposed:  Thickness Without Exposed Support Structures Wound Margin: Flat and Intact Exudate Amount: Medium Exudate Type: Serosanguineous Exudate Color: red, brown Foul Odor After Cleansing: No Slough/Fibrino Yes Wound Bed Granulation Amount: Medium (34-66%) Exposed Structure Granulation Quality: Pink, Friable Fascia Exposed: No Necrotic Amount: Medium (34-66%) Fat Layer (Subcutaneous Tissue) Exposed: Yes Necrotic Quality: Adherent Slough Tendon Exposed: No Muscle Exposed: No Joint Exposed: No Bone Exposed: No Treatment Notes Wound #42 (Right, Medial Malleolus) 1. Cleanse With Wound Cleanser Soap and water 2. Periwound Care Moisturizing lotion 3. Primary Dressing  Applied Iodoflex 4. Secondary Dressing ABD Pad Dry Gauze 6. Support Layer Applied Kerlix/Coban Notes netting. bunny boots to BLE. Electronic Signature(s) Signed: 04/07/2020 5:42:44 PM By: [name] RN Signed: 04/07/2020 5:58:22 PM By: Zenaida Deed RN, BSN Entered By: Zenaida Deed on 04/07/2020 09:31:38 -------------------------------------------------------------------------------- Wound Assessment Details Patient Name: Date of Service: [name]. 04/07/2020 8:30 A M Medical Record Number: [id number] Patient Account Number: [id number] Date of Birth/Sex: Treating RN: 1960-06-20 (59 y.o. [name]) [name] Primary Care Prospero Mahnke: [name] Other Clinician: Referring Talayia Hjort: Treating Luka Stohr/Extender: [name], [name] in Treatment: 2 Wound Status Wound Number: 43 Primary Pressure Ulcer Etiology: Wound Location: Right, Distal, Anterior Lower Leg Secondary Venous Leg Ulcer Wounding Event: Pressure Injury Etiology: Date Acquired: 02/21/2020 Wound Open Weeks Of Treatment: 2 Status: Clustered Wound: Yes Comorbid Anemia, Deep Vein Thrombosis, Colitis, Osteomyelitis, History: Neuropathy, Quadriplegia, Confinement Anxiety Wound Measurements Length: (cm) Width: (cm) Depth: (cm) Clustered Quantity: Area: (cm) Volume: (cm) 3.5 % Reduction in Area: -100.1% 1 % Reduction in Volume: 0% 0.1 Epithelialization: Small (1-33%) 2 Tunneling: No 2.749 Undermining: No 0.275 Wound Description Classification: Category/Stage III Wound Margin: Flat and Intact Exudate Amount: Small Exudate Type: Serosanguineous Exudate Color: red, brown Foul Odor After Cleansing: No Slough/Fibrino Yes Wound Bed Granulation Amount: Small (1-33%) Exposed Structure Granulation Quality: Red Fascia Exposed: No Necrotic Amount: Large (67-100%) Fat Layer (Subcutaneous Tissue) Exposed: Yes Necrotic Quality: Eschar, Adherent Slough Tendon Exposed: No Muscle  Exposed: No Joint Exposed: No Bone Exposed: No Treatment Notes Wound #43 (Right, Distal, Anterior Lower Leg) 1. Cleanse With Wound Cleanser Soap and water 2. Periwound Care Moisturizing lotion 3. Primary Dressing Applied Iodoflex 4. Secondary Dressing ABD Pad Dry Gauze 6. Support Layer Applied Kerlix/Coban Notes netting. bunny boots to BLE. Electronic Signature(s) Signed: 04/07/2020 5:42:44 PM By: [name] RN Signed: 04/07/2020 5:58:22 PM By: Zenaida Deed RN, BSN Signed: 04/07/2020 5:58:22 PM By: Zenaida Deed RN, BSN Entered By: Zenaida Deed on 04/07/2020 09:32:22 -------------------------------------------------------------------------------- Wound Assessment Details Patient Name: Date of Service: [name], [name] 04/07/2020 8:30 A M Medical Record Number: [id number] Patient Account Number: [id number] Date of Birth/Sex: Treating RN: June 24, 1960 (59 y.o. [name] Primary Care Tymika Grilli: [name] Other Clinician: Referring Dexter Sauser: Treating Haidyn Kilburg/Extender: [name], [name] in Treatment: 2 Wound Status Wound Number: 44 Primary Pressure Ulcer Etiology: Wound Location: Right, Proximal, Lateral Foot Wound Open Wounding Event: Pressure Injury Status: Date Acquired: 02/21/2020 Comorbid Anemia, Deep Vein Thrombosis, Colitis, Osteomyelitis, Weeks Of Treatment: 2 History: Neuropathy, Quadriplegia, Confinement Anxiety Clustered Wound: No Wound Measurements Length: (cm) 5.4 Width: (cm) 2 Depth: (cm) 0.3 Area: (cm) 8.482 Volume: (cm) 2.545 % Reduction in Area: -12.4% % Reduction in Volume: -12.4% Epithelialization: None Tunneling: No Undermining: No Wound Description Classification: Unstageable/Unclassified Wound Margin: Flat and Intact Exudate Amount: Large Exudate Type: Serosanguineous Exudate Color: red, brown Foul Odor After Cleansing: No Slough/Fibrino Yes Wound Bed Granulation Amount: Small (1-33%)  Exposed Structure Necrotic Amount: Large (67-100%) Fascia Exposed:  Thickness Without Exposed Support Structures Wound Margin: Flat and Intact Exudate Amount: Medium Exudate Type: Serosanguineous Exudate Color: red, brown Foul Odor After Cleansing: No Slough/Fibrino Yes Wound Bed Granulation Amount: Medium (34-66%) Exposed Structure Granulation Quality: Pink, Friable Fascia Exposed: No Necrotic Amount: Medium (34-66%) Fat Layer (Subcutaneous Tissue) Exposed: Yes Necrotic Quality: Adherent Slough Tendon Exposed: No Muscle Exposed: No Joint Exposed: No Bone Exposed: No Treatment Notes Wound #42 (Right, Medial Malleolus) 1. Cleanse With Wound Cleanser Soap and water 2. Periwound Care Moisturizing lotion 3. Primary Dressing  Applied Iodoflex 4. Secondary Dressing ABD Pad Dry Gauze 6. Support Layer Applied Kerlix/Coban Notes netting. bunny boots to BLE. Electronic Signature(s) Signed: 04/07/2020 5:42:44 PM By: [name] RN Signed: 04/07/2020 5:58:22 PM By: Zenaida Deed RN, BSN Entered By: Zenaida Deed on 04/07/2020 09:31:38 -------------------------------------------------------------------------------- Wound Assessment Details Patient Name: Date of Service: [name]. 04/07/2020 8:30 A M Medical Record Number: [id number] Patient Account Number: [id number] Date of Birth/Sex: Treating RN: 1960-06-20 (59 y.o. [name]) [name] Primary Care Prospero Mahnke: [name] Other Clinician: Referring Talayia Hjort: Treating Luka Stohr/Extender: [name], [name] in Treatment: 2 Wound Status Wound Number: 43 Primary Pressure Ulcer Etiology: Wound Location: Right, Distal, Anterior Lower Leg Secondary Venous Leg Ulcer Wounding Event: Pressure Injury Etiology: Date Acquired: 02/21/2020 Wound Open Weeks Of Treatment: 2 Status: Clustered Wound: Yes Comorbid Anemia, Deep Vein Thrombosis, Colitis, Osteomyelitis, History: Neuropathy, Quadriplegia, Confinement Anxiety Wound Measurements Length: (cm) Width: (cm) Depth: (cm) Clustered Quantity: Area: (cm) Volume: (cm) 3.5 % Reduction in Area: -100.1% 1 % Reduction in Volume: 0% 0.1 Epithelialization: Small (1-33%) 2 Tunneling: No 2.749 Undermining: No 0.275 Wound Description Classification: Category/Stage III Wound Margin: Flat and Intact Exudate Amount: Small Exudate Type: Serosanguineous Exudate Color: red, brown Foul Odor After Cleansing: No Slough/Fibrino Yes Wound Bed Granulation Amount: Small (1-33%) Exposed Structure Granulation Quality: Red Fascia Exposed: No Necrotic Amount: Large (67-100%) Fat Layer (Subcutaneous Tissue) Exposed: Yes Necrotic Quality: Eschar, Adherent Slough Tendon Exposed: No Muscle  Exposed: No Joint Exposed: No Bone Exposed: No Treatment Notes Wound #43 (Right, Distal, Anterior Lower Leg) 1. Cleanse With Wound Cleanser Soap and water 2. Periwound Care Moisturizing lotion 3. Primary Dressing Applied Iodoflex 4. Secondary Dressing ABD Pad Dry Gauze 6. Support Layer Applied Kerlix/Coban Notes netting. bunny boots to BLE. Electronic Signature(s) Signed: 04/07/2020 5:42:44 PM By: [name] RN Signed: 04/07/2020 5:58:22 PM By: Zenaida Deed RN, BSN Signed: 04/07/2020 5:58:22 PM By: Zenaida Deed RN, BSN Entered By: Zenaida Deed on 04/07/2020 09:32:22 -------------------------------------------------------------------------------- Wound Assessment Details Patient Name: Date of Service: [name], [name] 04/07/2020 8:30 A M Medical Record Number: [id number] Patient Account Number: [id number] Date of Birth/Sex: Treating RN: June 24, 1960 (59 y.o. [name] Primary Care Tymika Grilli: [name] Other Clinician: Referring Dexter Sauser: Treating Haidyn Kilburg/Extender: [name], [name] in Treatment: 2 Wound Status Wound Number: 44 Primary Pressure Ulcer Etiology: Wound Location: Right, Proximal, Lateral Foot Wound Open Wounding Event: Pressure Injury Status: Date Acquired: 02/21/2020 Comorbid Anemia, Deep Vein Thrombosis, Colitis, Osteomyelitis, Weeks Of Treatment: 2 History: Neuropathy, Quadriplegia, Confinement Anxiety Clustered Wound: No Wound Measurements Length: (cm) 5.4 Width: (cm) 2 Depth: (cm) 0.3 Area: (cm) 8.482 Volume: (cm) 2.545 % Reduction in Area: -12.4% % Reduction in Volume: -12.4% Epithelialization: None Tunneling: No Undermining: No Wound Description Classification: Unstageable/Unclassified Wound Margin: Flat and Intact Exudate Amount: Large Exudate Type: Serosanguineous Exudate Color: red, brown Foul Odor After Cleansing: No Slough/Fibrino Yes Wound Bed Granulation Amount: Small (1-33%)  Exposed Structure Necrotic Amount: Large (67-100%) Fascia Exposed:  Wound Imaging (photographs - any number of wounds) []  - 0 Wound Tracing (instead of photographs) []  - 0 Simple Wound Measurement - one wound X- 6 5 Complex Wound Measurement - multiple wounds INTERVENTIONS - Wound Dressings X - Small Wound Dressing one or multiple wounds 6 10 []  - 0 Medium Wound Dressing one or multiple wounds []  - 0 Large Wound Dressing one or multiple wounds X- 1 5 Application of Medications - topical []  - 0 Application of Medications - injection INTERVENTIONS - Miscellaneous []  - 0 External ear exam []  - 0 Specimen Collection (cultures, biopsies, blood, body fluids, etc.) []  - 0 Specimen(s) / Culture(s) sent or taken to Lab for analysis []  - 0 Patient Transfer (multiple staff / Nurse, adultHoyer Lift / Similar devices) []  - 0 Simple Staple / Suture removal (25 or less) []  - 0 Complex Staple / Suture removal (26 or more) []  - 0 Hypo / Hyperglycemic Management (close monitor of Blood Glucose) []  - 0 Ankle / Brachial Index (ABI) - do not check if billed separately X- 1 5 Vital Signs Has the patient been seen at the hospital within the last three years: Yes Total Score: 240 Level Of Care: New/Established - Level 5 Electronic Signature(s) Signed: 04/07/2020 5:58:22 PM By: Zenaida DeedBoehlein, Linda RN, BSN Entered By: Zenaida DeedBoehlein, Linda on 04/07/2020 09:46:56 -------------------------------------------------------------------------------- Encounter Discharge Information Details Patient Name: Date of Service: [name] RhineHUMBLE, [name] 04/07/2020 8:30 A M Medical Record Number: [id number] Patient Account Number: [id number] Date of Birth/Sex: Treating RN: Aug 03, 1960 (59 y.o. Tammy SoursM) Deaton, Bobbi Primary Care Levie Owensby: Jamison OkaBakare, Mobolaji Other Clinician: Referring Ellanor Feuerstein: Treating  Jahmal Dunavant/Extender: Alethia Bertholdobson, Michael Bakare, [name] in Treatment: 2 Encounter Discharge Information Items Discharge Condition: Stable Ambulatory Status: Walker Discharge Destination: Home Transportation: Private Auto Accompanied By: self Schedule Follow-up Appointment: Yes Clinical Summary of Care: Electronic Signature(s) Signed: 04/07/2020 5:09:42 PM By: Shawn Stalleaton, Bobbi Entered By: Shawn Stalleaton, Bobbi on 04/07/2020 12:25:06 -------------------------------------------------------------------------------- Lower Extremity Assessment Details Patient Name: Date of Service: [name] RhineHUMBLE, [name] 04/07/2020 8:30 A M Medical Record Number: [id number] Patient Account Number: [id number] Date of Birth/Sex: Treating RN: Aug 03, 1960 (59 y.o. [name] PetitM) Yevonne PaxEpps, Carrie Primary Care Doroteo Nickolson: Jamison OkaBakare, Mobolaji Other Clinician: Referring Lovina Zuver: Treating Timmi Devora/Extender: Alethia Bertholdobson, Michael Bakare, [name] in Treatment: 2 Edema Assessment Assessed: [Left: No] [Right: No] Edema: [Left: Yes] [Right: Yes] Calf Left: Right: Point of Measurement: From Medial Instep 32 cm 26 cm Ankle Left: Right: Point of Measurement: From Medial Instep 18 cm 18 cm Electronic Signature(s) Signed: 04/07/2020 5:42:44 PM By: Yevonne PaxEpps, Carrie RN Entered By: Yevonne PaxEpps, Carrie on 04/07/2020 09:04:16 -------------------------------------------------------------------------------- Multi Wound Chart Details Patient Name: Date of Service: [name] RhineHUMBLE, [name] 04/07/2020 8:30 A M Medical Record Number: [id number] Patient Account Number: [id number] Date of Birth/Sex: Treating RN: Aug 03, 1960 (59 y.o. Damaris SchoonerM) Boehlein, Linda Primary Care Daniell Paradise: Jamison OkaBakare, Mobolaji Other Clinician: Referring Kendalyn Cranfield: Treating Alvilda Mckenna/Extender: Alethia Bertholdobson, Michael Bakare, [name] in Treatment: 2 Vital Signs Height(in): Pulse(bpm): 82 Weight(lbs): Blood Pressure(mmHg): 125/79 Body Mass Index(BMI): Temperature(F): 98.2 Respiratory Rate(breaths/min):  18 Photos: [40:No Photos Left, Medial Lower Leg] [41:No Photos Right T Fourth oe] [42:No Photos Right, Medial Malleolus] Wound Location: [40:Gradually Appeared] [41:Trauma] [42:Gradually Appeared] Wounding Event: [40:Venous Leg Ulcer] [41:Lymphedema] [42:Venous Leg Ulcer] Primary Etiology: [40:N/A] [41:N/A] [42:N/A] Secondary Etiology: [40:Anemia, Deep Vein Thrombosis,] [41:Anemia, Deep Vein Thrombosis,] [42:Anemia, Deep Vein Thrombosis,] Comorbid History: [40:Colitis, Osteomyelitis, Neuropathy, Colitis, Osteomyelitis, Neuropathy, Colitis, Osteomyelitis, Neuropathy, Quadriplegia, Confinement Anxiety 02/21/2020] [41:Quadriplegia, Confinement Anxiety 02/21/2020] [42:Quadriplegia, Confinement  Anxiety 02/21/2020] Date Acquired: [40:2] [41:2] [42:2] Weeks of Treatment: [40:Open] [41:Open] [42:Open] Wound Status: [40:No] [

## 2020-04-10 NOTE — Progress Notes (Signed)
[name], [name] ([id number]) Visit Report for 04/07/2020 HPI Details Patient Name: Date of Service: [name], [name] 04/07/2020 8:30 A M Medical Record Number: [id number] Patient Account Number: [id number] Date of Birth/Sex: Treating RN: 1961/03/04 (59 y.o. [name] Primary Care Provider: [name] Other Clinician: Referring Provider: Treating Provider/Extender: [name], [name] in Treatment: 2 History of Present Illness HPI Description: 11/23/15; this is a patient I remember from a previous stay in this clinic in 2012. The patient says this was for a wound in this same area as currently although I have no information to verify that. Most of the information is provided by his caregiver from the group home where he resides. Apparently he has had a wound in this area for 8 months. He also has erythema around this area and has had multiple rounds of antibiotics apparently with some improvement but the erythema is persists. He apparently had a fracture in the ankle 2 months ago and was seen by Dr. Lajoyce Corners . He apparently had a splint applied and more recently a heel offloading boot. He had home health coming out to a week ago not clear what they are dressing this with but they apparently discharged him perhaps because of one of Dr. 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 He has a Nurse, adult for transferring at home he is nonambulatory and does not wear footwear. He is not a diabetic. Looking through  link he appears to have fairly active primary care. The area on the ankle is variably labeled as a pressure area and/or chronic venous insufficiency. He had a recent venous ultrasound on 5/25 that did not show a DVT in the left leg however this was not a reflux study. There was no superficial DVT either. Arterial studies on 10/15/14 showed a left ABI of 1.16 Center right of 1.12 waveforms were triphasic he does not  have significant arterial disease. He has had recent x-rays of the left foot and ankle. The left foot did not show any abnormality. Left ankle x-ray showed a spiral fracture of the left tibial diaphysis without evidence of osteomyelitis. 11/30/15 culture I did last week showed pseudomonas I changed him to oral ciprofloxacin. Erythema is a lot better. 12/07/15 area around the wound shows considerable improvement of the periwound erythema. 12/14/15; the patient has a improvement of the periwound erythema which in retrospect I think with cellulitis successfully treated. We have been using Iodoflex started last week 12/25/15 wound on the left medial malleolus and dorsal left foot. Theraskin #1 applied READMISSION 02/27/16; the patient was admitted to hospital from 8/14 through 01/18/16 initially felt to have cellulitis of his left lower leg however he was noted to have significant persistent pain and swelling. A duplex ultrasound was ordered that was negative. Obie Dredge an x-ray was done that showed a spiral fracture of the left leg. With posterior displacement and angulation at the mid left tibia. Orthopedics saw the patient and splinted the leg. He was then sent to [facility] skilled facility and only return to his group home last week. He has wounds on the left medial malleolus, left dorsal foot and a worrisome area on the plantar left heel which is not really open but may represent a hematoma or a DTI 03/06/16; areas of left anterior and medial ankle both look improved. There are 3 small wounds here. He has a area over the dorsal left first metatarsal head which is not open. In meantime his left  heel has opened 03/14/16 the medial left ankle wounds looks stable to improved. The area on the posterior left heel has opened up and had a very adherent eschar and slept over this 03/21/16 patient has 3 wounds on his left medial ankle is stable area on his left dorsal first metatarsal head appears to be improved.  Continued problems with adherent eschar over the left heel 03/28/16; patient has 3 wounds on his left medial ankle and lower leg all of these appear to be stable. The problem continues to be the left heel and the adherent eschar. This may have been a pressure ulcer from her brace at one point. He has a English as a second language teacher we have been using [employer] and all wound areas. Dressings are being changed by home health 04/04/16; the patient's areas on the medial aspect of the left ankle looks superficial and appear to be gradually closing. The area on the plantar aspects/Achilles aspect of his left heel as surface slough and nonviable subcutaneous tissue requiring debridement. 04/11/16; the areas on the patient's medial left ankle appear to be superficial and appear to be closing. The area on the plantar heel on the left has surface slough and nonviable subcutaneous tissue requiring debridement. I don't think this is too much different from last week there is also undermining 04/25/16; patient is followed in his group home by [facility] home health. The area on the plantar heel is smaller but with still with some depth. We're using Iodoflex to this. The areas on the left ankle looks healthy but dry and somewhat larger. Cause of this is not really clear using Prisma 05/02/16; areas x3 all look improved expecially the areas on the medial ankle. Heel wound is smaller but still deep 05/09/16; all areas 3 look improved on the medial ankle. Heel wound is also contracting. 12/0.9/17 the areas on his left medial ankle are all closed. I think these are largely chronic venous insufficiency however there is no edema. The area on his left heel was a pressure area. READMISSION 08/02/16; this man is a patient that we have had in this clinic on several times before. I believe he has cerebral palsy is nonambulatory. He has chronic venous insufficiency with venous inflammation. He was last year in the fall of 2017 with areas on his  left medial ankle left heel and left dorsal foot. He arrives today with once again a vague history. As mentioned he is nonambulatory he spends most of his day in a pressure offloading boot nevertheless they noted wounds in this area on the medial left ankle 2 weeks ago. There is any other history here I am uncertain 08/15/16; the patient's wounds on his medial ankle actually look better however the area on the left lateral/dorsal foot is covered in black necrotic eschar. Previous formal arterial studies did not show significant arterial disease with normal ABIs and triphasic waveforms. He is immobilized and at risk for pressure although he seems aware protective boots at all times as far as I am aware. 08/22/16; in general his wounds look better. All the wounds on his medial left ankle look improved the area on his lateral foot however continues to be problematic. Patient comes in the clinic today in a new wheelchair he is quite delighted 09/05/16; a wound on the left medial Calf and left dorsal foot are healed. The area on the left lateral foot is improved however the area on the left medial malleolus is inflamed with a considerable degree of surrounding erythema. This  looks like cellulitis. ABIs and clinical exam are within normal limits 09/12/16; the patient continues to have a wound on the left medial calf. I was concerned about cellulitis in this area last week and gave him a course of doxycycline. The wound looks better here. The area on the left lateral foot however is unchanged 09/20/16; the patient came in this week early after traumatizing his toe on the left foot on a table while moving around in his motorized wheelchair. 09/26/16- he is here for follow-up evaluation of his left foot and malleolus ulcers. Since his appointment he abraded his left medial hallux against a table while in his motorized wheelchair, apparently trying to pull up to the table for a meal. He continues to complain of  tenderness to the left lateral foot, surrounding the current ulcer 10/07/16; left foot and malleolus ulcers. 10/14/16; the patient is doing well. He has a wound on the left lateral, left medial malleolus and atraumatic wound on the left great toe. Although these appear to be superficial and closing 10/31/16; the patient is doing well. The area on the left lateral foot, left medial malleolus and the left great toe or all epithelialized. He has home health 11/14/16; patient arrives today with the area on the left great toe healed. The left lateral foot however in the left medial malleolus and the ankle superficial wounds that are slightly larger. He has erythema and tenderness around the area on the left lateral foot. As well he is complaining of dysuria. His caregiver notes that when he gets like this he is more lethargic which indeed seems to be the case today 11/21/16; left great toe is still healed. His wound on the left lateral foot appears healthy and slightly smaller also the area on the left medial ankle. This had black eschar on it which I removed. The remaining wound looks satisfactory 12/12/16; patient hasn't been here in a number of weeks. The area on the left lateral foot is healed however on his medial ankle has 2 open areas one of them covered with a necrotic eschar. He also has a new wound on the right second toe which was apparently trauma induced while he was at a movie 12/27/16; the patient has wounds on his left medial mallelus and left lateral foot that are just about closed. The area on the right second toe which was trauma from last time is still open but again everything looks a lot better here. 01/30/17; patient arrives in clinic today and is really been a long time since I've seen him. The wounds are all epithelialized on the left foot this includes his left lateral foot, left medial malleolus. Apparently was in the ER for what of I think was a fungal infection on his buttock's he was  prescribed Diflucan and doxycycline I think this is already getting better. READMISSION T is a now 59 year old man who has advanced cerebral palsy and functional quadriplegia. We have had him in this clinic multiple times before for wounds erry on his right ankle, left ankle, left dorsal foot and most recently from 08/02/16 through 01/30/17 with his left medial ankle and left foot and finally right second toe. His attendant from the group home where he lives is very knowledgeable about him. She states that they noticed breakdown in his skin in his feet sometime in January although they could not get an appointment until today in this clinic. The previous wounds have always been felt to be secondary to incidental trauma/pressure areas  or possibly some degree of chronic venous insufficiency. He had normal arterial studies in may of 2016. He has definite open wounds on the right lateral foot over the base of the fifth metatarsal, the right second toe DIP, the left medial malleolus. He has concerning areas over the left lateral fifth metatarsal base, retaform purpura over the medial left ankle and perhaps the medial left first toe. 07/21/17; culture we did last week from the base of the right fifth metatarsal grew group G strep. This should've been sensitive to the doxycycline I gave him. The other major wounds are on the right second toe DIP, left medial malleolus, base of the left lateral fifth metatarsal and the medial left ankle and medial left toe. 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 We have macrovascular arterial studies scheduled for Thursday although I don't think this is what the problem is. 07/28/17; patient arrives with really no improvement. He has a wound on the right second toe, lateral right foot, right medial malleolus which was new this week, left medial malleolus, but looked like a DTI on the left great toe. His macrovascular studies are on 07/30/17 although I would be  surprised if this provides all the explanation. He has small areas even on his right second and third toe which look like splinter-type hemorrhages. The question I have is where is this coming from. I'd like to see the vascular studies however before we pursue this. He has had a DVT in the past but is no longer on anticoagulants. I'll need to review his history 08/04/17; the patient had his ARTERIAL STUDIES this showed a right ABI of 1.16 and the left ABI of 1.20. TBI on the left at 0.84. He is at a right first toe amputation. The studies were unchanged from studies in May 2016. It was not felt that he had significant lower extremity arterial disease In the meantime we are using silver alginate to the wounds on the right second toe right lateral foot left medial malleolus. I'm going to change to Silver collagen today. 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 Working him up for this would include an echocardiogram probably a CT scan of the chest and abdomen to look at the major thoracic and abdominal aortic vessels. I am not going to involve myself in this unless there is more symptoms/signs 08/18/17; the patient arrives today with wounds looking somewhat better. The remaining areas are the right second toe dorsal, right lateral foot and left medial malleolus which only has a small open area. We've been using silver collagen 09/01/17;right second toe dorsally probes to bone today. This is a deterioration. We've been using silver collagen. Only a small open area remains on the left medial malleolus. 09/09/17; right second toe appears better. There is no probing bone. He has a small area remaining on the left medial malleolus, the right lateral foot. We have been using silver collagen 10/07/17; the patient returns to clinic today after a month hiatus. He has wounds over the right  second toe, right lateral foot and the left medial malleolus. We have been using silver collagen. He has home health. 10/24/17; patient has open wounds over the right second toe, now the left fourth toe and a superficial area over his left buttock. Some of the other areas have healed including the left medial malleolus and right lateral foot. Using silver collagen on these  wounds 11/07/17; the patient has closed his wounds on the right second toe and left fourth toe although they look all normal. Last time he was here he had a superficial area over the left buttock this is also closed. The right lateral foot wound I had closed out last time although it is seems to have reopened today. 628/19 the areas over the right second and left fourth toe remains closed. The right lateral foot has tightly adherent necrotic surface over. We have been using silver collagen he offloads this and a bunny boot 12/12/17; the patient has reopened the recurrent area over the right second PIP joint. 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 He does not have obvious macrovascular disease. The area over the right lateral foot looks about the same as last time tightly adherent necrotic debris. We've been using Hydrofera Blue 12/25/17; 2 week follow-up. The area over the right second PIP is closed however the area on the right lateral foot is worse with surrounding erythema and pain compatible with cellulitis is going to need an antibiotic. AS WELL..... We have looked at his buttocks and lower back. He has 3 stage II ulcers. The exact history here is unclear however he is very disabled man with cerebral palsy. He goes to a day program from the group home in which she lives 5 days a week and I think he is on his buttock all day on these visits. Surrounding the wounds see has extensive stage I injury as well. FINALLY it looks as though he has lost weight. I'm not the only  staff member that is recognized this 01/08/18 on evaluation today patient appears to be doing better in regard to his gluteal wounds. In fact everything appears to be healed back here although very dry. He has a couple open sores on the scrotal region other than this it is really his foot that is still giving him the most trouble. He did complete the Keflex unfortunately it seems like there's still a lot of erythema in this in fact looks worse than it did previous. I'm gonna culture this today and likely start with a different antibiotic. 01/16/18; I was reassured that the wounds on his buttocks and sacrum are healed. I did not look at these the day. The culture that was done last week on 01/11/18 of the right lateral foot wounds showed Pseudomonas. Bactrim is not a good choice for this. Ciprofloxacin interacts with his muscle relaxants therefore I prescribed cefdinir 300 twice a day for 7 days. Bactrim coverage of pseudomonas is not reliable although the degree of erythema looks a lot better 01/30/18; the patient has completed his antibiotics. The area does not have infection on the right lateral foot. The wounds look a lot better. We've been using silver alginate 02/12/18; there is on the right lateral foot. Copious amounts of surface eschar. Also similarly over the PIP of the right second toe. We've been using silver alginate to date 03/02/2018; the area that remains is on the right lateral foot. The right second toe is healed. 03/16/2018; the area there is original wound on the right lateral foot. Still requiring debridement. We are using Hydrofera Blue. The right second toe is still healed/closed 03/30/2018; right lateral foot there is 2 small open areas remaining. They actually look quite healthy we have been using Hydrofera Blue the right second toe remains closed 04/13/2018; right lateral foot. 1 of the 2 small open  areas from last time has closed over. The other had surface slough that I removed  with a #3 curette. This looks quite healthy. The right second toe remains closed. We are using Hydrofera Blue 05/01/2018; right lateral foot very superficial areas that look like they are trying to close over. He had a new threatened area on the left medial ankle just below the medial malleolus. I think these represent chronic venous insufficiency areas 05/14/2018; the right right lateral foot looks like it closed over as was predicted last time however he has some dark areas subcutaneously erythema superiorly and this is really very tender. This is either coexistent cellulitis or perhaps a deep tissue injury. The area on the left medial ankle is superficial but has some size. We have been using Hydrofera Blue to both areas. The patient complains of pain in his right foot 06/09/2018; right lateral foot was closed over last time but there was coexistent cellulitis around this. I gave him Levaquin. He also has an area on the left medial malleolus. He has not been back to clinic in over 3-1/2 weeks. We are using Hydrofera Blue I think to the left medial malleolus and silver alginate to the right lateral foot 1/21; right lateral foot still non-viable debris over this area and the left medial malleolus. Both of these vigorously washed off with antiseptic gauze. We have been using silver alginate. 2/4; right lateral foot this looks somewhat better. Unfortunately the area over the left medial malleolus has deteriorated. Using silver alginate in both area 2/18; both wounds arrived today covered in a lot of necrotic debris. We have been using silver alginate. Clearly not making a lot of progress. 3/3; the patient's right second toe is reopened over the PIP. I am not sure why this is happening. He may have microvascular disease. We have checked his macrovascular flow in the past and found it to be normal. He may need an x-ray. The area on the right lateral foot requires debridement. The area on the left lateral  ankle is worse. We have been using PolyMem Ag 08/26/18 on evaluation today patient appears to be doing a little worse in regard to his lower extremities based on what I'm seeing what the nursing staff is telling me. His left medial ankle appears potential to be infected there is your thing on want surrounding the wound. His right second toe is also of concern at this point in my pinion. We have not done x-rays of these areas that may be a good idea to go ahead and proceed with at this point to ensure will not deal with anything such as osteomyelitis. 4/16; it is been a while since I saw this patient. He now has wounds on the right lateral foot, dorsal right second toe, substantially on the left medial malleolus and a small area on the left lateral foot. We have been using PolyMem Silver. Curlex and Coban. He has home health changing the dressing. Since last time he was here he had a culture of the left medial ankle that showed MRSA. He is completed a course/7 days of doxycycline. XRAYS of the left ankle and right foot that were ordered were negative for osteomyelitis 4/30; he arrives in clinic today with some maceration around the wound and a lot of tenderness and erythema in a circumferential area around the right lateral foot. He has wound areas on the right lateral foot dorsal right second toe, largely over the left medial malleolus and a small area over  the left lateral foot 5/7; completed his antibiotics from last time right lateral foot cellulitis is better and the area on his left knee which we discovered after he had been seen by the provider also is improved. He still has extensive wound areas over the right lateral foot, left medial malleolus, right second toe and left lateral foot although all of these look somewhat better with PolyMem 5/15. Wounds on the right lateral foot, right second toe left medial malleolus and the left lateral foot. All of these look somewhat better. We have been  using PolyMem Ag 5/26; the wound on the right lateral foot is almost closed over but he still complains of a lot of pain in this area. He has a eschared area on the right second toe but no open wound.. Also concerning today his original wound on the left medial malleolus looks healthy however just inferior to this almost areas of deep tissue injury and there is an area on the left lateral foot also looks like a deep tissue injury. In the past I have wondered whether he might have cholesterol emboli or some other form of a vasculopathy. I wonder whether this could have something to do with this. 6/2; right lateral foot wound is still open I gave him empiric antibiotics last week without really a lot of change she is still complaining of pain in this area. We x- rayed his feet at some point in April these were negative. It would be difficult to do an MRI. The area on his left medial malleolus looks better. Right second toe is healed. He still has what looks to be a small pressure related area on the left lateral foot but it is not open. We are using PolyMem to all wound which seems to be doing nicely 6/16; right lateral foot wound is still open. The wound looks better although there is still a lot of tenderness around it. Rather than more empiric antibiotics I have cultured this today. We are using PolyMem Ag. On the left lateral malleolus the wound appears better. He has a new area on the left lower anterior pretibial area 6/29; 2-week follow-up. Culture I did of the right lateral foot 2 weeks ago showed MRSA. I gave him 2 weeks of Septra which he should be just about finishing now and this seems better. We got a call from home health last week to report swelling in the left leg because we were listed as his primary. We referred him back to primary care. He arrives today with intense erythema and tenderness around the 2 wounds on the left medial ankle on the left lower calf. The area on the right  lateral foot looks better 7/7; I brought him back this week to follow-up on his left foot which had cellulitis last week. I gave him clindamycin which as far as I know he tolerated. The erythema is better. He has a 3 current wounds one on the right lateral foot a small area on the right lateral malleolus and the more recent area on the left anterior pretibial area. 7/21; bilateral distal lower leg and foot wounds. The area on his right lateral foot looks a lot better. He has an area on his left pretibial area distally. The area on the medial malleolus on the left which is 1 of his worst wounds has closed over. He does not appear to have any open areas in his remaining toes 8/4-Patient returns at 2 weeks, he has the remaining wound on  the left lower leg the other 2 wounds have healed. Patient is continued to be treated with silver alginate with a 2 layer wrap on the left 8/18; I been following this patient for a long period for bilateral lower extremity wounds. 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 His wounds are probably pressure although he religiously wears thick bunny boots. He does not have macrovascular disease but I did formal testing on him perhaps 2 or 3 years ago. I have often wondered about either cholesterol emboli or a vasculopathy of some sort given the distal nature of these variable locations etc. He has 2 areas that we have been following one on the left medial lower leg and one on the right lateral foot. Both of these are mostly healed today there is some eschar over sections of both areas although I did not disturb this. 03/31/2019 on evaluation today patient appears to be doing a little bit more poorly in regard to the sacral region. He has 2 small wounds noted at this point. Fortunately there is no signs of active infection at this time. No fevers, chills, nausea, vomiting, or diarrhea. He is having some pain when he sits on  his gluteal region a big part of the reason he has these wounds is likely due to the fact that his wheelchair has not been functioning properly. He does still have a couple areas of deep tissue injury on his lower extremities he does need new Prevalon offloading boots as well. 04/28/2019 on evaluation today patient appears to be doing well with regard to his wounds in particular. His ankle area has fairly small based on what I am seeing currently and his sacral region actually is still open but does not appear to be doing too poorly in my opinion. Fortunately there is no signs of active infection at this time. He was in the hospital unfortunately and this was from 04/08/2019 through 04/12/2019 secondary to persistent diarrhea, dehydration and acute kidney injury. With that being said fortunately the patient does not seem to be doing too poorly at this time which is good news. He is having pain mainly in the sacral region. 06/23/2019 on evaluation today patient appears to be doing well with regard to his sacral region which in fact is completely healed. Unfortunately he has an area of rubbing/pressure on his right lateral chest/back region. This is where it is rubbing up on his chair. Subsequently he also has an open wound on the left medial malleolus which is not very open but starting to crack and then on the right lateral foot this is much more open at this point. Unfortunately there is no signs of significant improvement in regard to these areas and in fact there does appear to be evidence of active infection quite significantly. No fevers, chills, nausea, vomiting, or diarrhea. The patient did test positive for COVID-19 that has been greater than 14 days ago although they did not know the exact timing. That is why he was not here for his last appointment. 07/07/2019 upon evaluation today patient actually appears to be doing quite well in regard to his left ankle as well as the chest/back region which  is doing great in fact appears to be healed in my opinion. He still has an issue on his lateral right foot that is open and appears to be infected but does have me more concerned. In fact I do believe the doxycycline helped in general for the  cellulitis but I believe that may have been more MRSA related based on the culture that we did. What I am seeing right now has more of the blue-green drainage and may be more consistent with a Pseudomonas that I was hoping was not really causing much of an issue but I think it may be at this point on the right lateral foot. For that reason I do think treatment would be a good idea of the reason I avoided the Cipro before was the potential for QT prolongation which could obviously cause problems with the patient with results of interaction with respiratory wound. The tizanidine also had some interactions as well. Nonetheless I think at this point that it would be a possibility for [place] to use topical gentamicin to see if this could be of benefit for the patient underneath the silver alginate dressing. 07/21/2019 upon evaluation today patient appears to be doing fairly well at this point in regard to his lower extremity on the right. He does have a wound on his right lateral foot as well as the right fifth toe and the right fourth toe. Unfortunately he continues to experience significant issues with pressure and trauma to some degree and I think this is just due to the fact that again he is not really not able to control his legs and what is happening here. Fortunately there is no signs of active infection at this time. 08/04/2019 upon evaluation today patient actually appears to be doing quite well with regard to his toe ulcers I am very pleased in that regard. Unfortunately the foot ulcer is showing some signs of erythema his primary care provider has placed him on clindamycin at this point. Again I do believe this can be beneficial for him. With that being said I do  think the patient may benefit from a light Kerlix and Coban wrap to help with some of his edema at this point. 08/18/2019 upon evaluation today patient appears to be doing still poorly in regard to his lateral foot ulcer. This is not very deep but there is some concern about the possibility of osteomyelitis. I think it would be appropriate for [place] to go ahead and have an x-ray at this time. The patient is currently on clindamycin which from his culture which was performed on 06/23/2019 it does appear that he would benefit from that in regard to the MRSA. However Pseudomonas was also identified then and the patient actually has some blue-green drainage at this point as well. I am much more concerned about the possibility of the Pseudomonas being an infection is causative agent at this point that I am the MRSA. Subsequently Cipro the patient was sensitive to but we never use that is stable use a topical gent due to the fact that unfortunately there was some interaction between the Cipro and the patient's risk for down potentially causing QT prolongation. 08/31/2019; have not seen this patient in quite a period of time. He has a substantial wound on his right lateral foot we have been using gentamicin ointment with Hydrofera Blue over the top. From what I am able to determine he is also on antibiotics perhaps clindamycin although I have not had a chance to look over this. He also has an eschared area on the right fifth toe as well as the right second toe. 4/20; right lateral foot wound. He comes in today with intense erythema and tenderness around this wound. The wound itself has a surface on it but it doesn't  look particularly healthy a lot of gelatinous debris. There is no palpable bone. We've been using gentamicin ointment and Hydrofera Blue. Since the patient was last here he was seen by Dr. Algis Liming of infectious disease. He ordered an MRI that I think is due later this month on 4/30. He apparently also  checked a sedimentation rate and C-reactive protein metabolic panel CBC with differential. At the time he was seen he wanted to withhold any microbials pending identification of the depth of the infection although looking at this I don't know that I'm going to be able to hold off for another 10 days. 4/29; he went on to have the MRI that was ordered by Dr. Algis Liming of infectious disease. 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 I had given him cefdinir last week for surrounding cellulitis he is finishing this today. I have communicated with Dr. Algis Liming who wants bone for pathology and culture before proceeding with consideration of IV antibiotics. Fortunately he is due to see Dr. Samuella Cota of podiatry today I am hopeful that the bone biopsy and culture will be considered there 5/4 as I understand things Dr. Samuella Cota actually wants to remove the diseased proximal fifth metatarsal. Looking back at this decision is probably not a bad one. He has good blood flow he is nonambulatory they are apparently going to do this next week. Paradoxically the wound on the lateral part of his foot is actually a lot better. The cellulitis that was so problematic 2 weeks ago also has resolved. 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 The last time I saw him he had a diseased proximal fifth metatarsal. 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 Samuella Cota. Culture of this showed Pseudomonas and he was followed by infectious disease Dr. Algis Liming treated with ciprofloxacin. 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 He had Integra placed. A CandS showed Pseudomonas again. Since then he has had Silvadene as a primary dressing, then Santyl and more recently Xeroform and then back to Santyl 3 times a  week per his attendant who comes with him. Reviewing his records I was really not prepared for the number of wounds that he currently has. This includes substantial areas on the right lateral foot, right lateral ankle, right fourth toe dorsally, right ankle anteriorly as well as the left lateral ankle and foot. His medical history is unchanged ABIs 1.2 on the right and 1.21 on the left he has not been known to have arterial insufficiency and is not a diabetic 11/5; patient I readmitted to the clinic last week. He has multiple wounds on the right lateral foot, right fourth toe and a new area on the right second toe. He has areas on the right dorsal ankle and a substantive wound over the left lateral ankle and foot. We are using silver collagen. We put him under compression last week and this seems to have helped. He has home health coming out one other time per week to change the dressing 11/12; no real improvement. Multiple bilateral foot and ankle wounds. On the left he has a substantial wound on the left lateral ankle On the right he has wounds on the dorsal second and fourth toes. Right lateral foot we have merged to wounds these are necrotic She  has an area on the right dorsal ankle and a area on the right medial malleolus with marked surrounding erythema likely cellulitis. The patient complains about pain that kept him awake all last night from wraps that were too tight. He is not known to have arterial insufficiency. Electronic Signature(s) Signed: 04/10/2020 9:07:52 AM By: Baltazar Najjar MD Entered By: Baltazar Najjar on 04/07/2020 09:46:11 -------------------------------------------------------------------------------- Physical Exam Details Patient Name: Date of Service: HUMBLEGalan, Ghee 04/07/2020 8:30 A M Medical Record Number: [id number] Patient Account Number: [id number] Date of Birth/Sex: Treating RN: January 01, 1961 (59 y.o. [name] Primary Care Provider: [name]  Other Clinician: Referring Provider: Treating Provider/Extender: [name], [name] in Treatment: 2 Constitutional Sitting or standing Blood Pressure is within target range for patient.. Pulse regular and within target range for patient.Marland Kitchen Respirations regular, non-labored and within target range.. Temperature is normal and within the target range for the patient.Marland Kitchen Appears in no distress. Notes Wound exam Substantial area on the right lateral foot which is necrotic although I did not attempt to debride this today. These are deep but no palpable bone Anterior ankle and medial malleolus. Again wounds or not that viable looking. Furthermore on the medial malleolus there is surrounding erythema and tenderness likely cellulitis. He has areas on the dorsal second and fourth toe neither 1 of these look particularly healthy Substantial area on the left medial ankle this does not look too bad. Healthy looking tissue although it bleeds very easily per our intake nurse Electronic Signature(s) Signed: 04/10/2020 9:07:52 AM By: Baltazar Najjar MD Entered By: Baltazar Najjar on 04/07/2020 09:49:53 -------------------------------------------------------------------------------- Physician Orders Details Patient Name: Date of Service: [name] 04/07/2020 8:30 A M Medical Record Number: [id number] Patient Account Number: [id number] Date of Birth/Sex: Treating RN: [birth year] (59 y.o. [name] Primary Care Provider: [name] Other Clinician: Referring Provider: Treating Provider/Extender: [name], [name] in Treatment: 2 Verbal / Phone Orders: No Diagnosis Coding ICD-10 Coding Code Description I89.0 Lymphedema, not elsewhere classified T81.31XD Disruption of external operation (surgical) wound, not elsewhere classified, subsequent encounter S90.811D Abrasion, right foot, subsequent encounter L97.512 Non-pressure chronic ulcer of  other part of right foot with fat layer exposed L97.312 Non-pressure chronic ulcer of right 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 L97.322 Non-pressure chronic ulcer of left 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 Follow-up Appointments ppointment in 1 week. - Thurs. 8am 11/18 Return A Dressing Change Frequency Other: - change twice a week. once at wound center and once at home health. Skin Barriers/Peri-Wound Care Moisturizing lotion - both legs. Wound Cleansing Clean wound with Wound Cleanser May shower with protection. - use cast protectors. Primary Wound Dressing Wound #40 Left,Medial Lower Leg Hydrofera Blue - classic moistened with saline Wound #41 Right T Fourth oe Iodoflex Wound #42 Right,Medial Malleolus Iodoflex Wound #43 Right,Distal,Anterior Lower Leg Iodoflex Wound #44 Right,Proximal,Lateral Foot Iodoflex Wound #46 Right T Second oe Iodoflex Secondary Dressing Wound #41 Right T Fourth oe Kerlix/Rolled Gauze Dry Gauze Wound #42 Right,Medial Malleolus Dry Gauze ABD pad Wound #43 Right,Distal,Anterior Lower Leg Dry Gauze ABD pad Wound #44 Right,Proximal,Lateral Foot Dry Gauze ABD pad Wound #46 Right T Second oe Kerlix/Rolled Gauze Dry Gauze Edema Control Kerlix and Coban - Bilateral Elevate legs to the level of the heart or above for 30 minutes daily and/or when sitting, a frequency of: - throughout the day. Off-Loading Multipodus Splint to: - or bunny boots to both feet. float feet while resting in chair and bed. Home Health Continue Home Health  skilled nursing for wound care. - [facility] Patient Medications llergies: adhesive tape A Notifications Medication Indication Start End cellulits right ankle Levaquin DOSE oral 500 mg tablet - 1 tablet oral daily for 7 days Electronic Signature(s) Signed: 04/07/2020 9:52:48 AM By: Baltazar Najjar MD Entered By: Baltazar Najjar on 04/07/2020  09:52:46 -------------------------------------------------------------------------------- Problem List Details Patient Name: Date of Service: [name] 04/07/2020 8:30 A M Medical Record Number: [id number] Patient Account Number: [id number] Date of Birth/Sex: Treating RN: 21-May-1961 (59 y.o. [name] Primary Care Provider: [name] Other Clinician: Referring Provider: Treating Provider/Extender: [name], [name] in Treatment: 2 Active Problems ICD-10 Encounter Code Description Active Date MDM Diagnosis I89.0 Lymphedema, not elsewhere classified 03/23/2020 No Yes T81.31XD Disruption of external operation (surgical) wound, not elsewhere classified, 03/23/2020 No Yes subsequent encounter S90.811D Abrasion, right foot, subsequent encounter 03/23/2020 No Yes L97.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 layer exposed 03/23/2020 No Yes L97.312 Non-pressure chronic ulcer of right 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 03/23/2020 No Yes L97.322 Non-pressure chronic ulcer of left 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 03/23/2020 No Yes L03.115 Cellulitis of right lower limb 04/07/2020 No Yes Inactive Problems Resolved Problems Electronic Signature(s) Signed: 04/10/2020 9:07:52 AM By: Baltazar Najjar MD Entered By: Baltazar Najjar on 04/07/2020 09:43:34 -------------------------------------------------------------------------------- Progress Note Details Patient Name: Date of Service: [name] 04/07/2020 8:30 A M Medical Record Number: [id number] Patient Account Number: [id number] Date of Birth/Sex: Treating RN: 30-Apr-1961 (59 y.o. [name] Primary Care Provider: [name] Other Clinician: Referring Provider: Treating Provider/Extender: [name], [name] in Treatment: 2 Subjective History of Present Illness (HPI) 11/23/15; this is a patient I remember from a previous stay in this  clinic in 2012. The patient says this was for a wound in this same area as currently although I have no information to verify that. Most of the information is provided by his caregiver from the group home where he resides. Apparently he has had a wound in this area for 8 months. He also has erythema around this area and has had multiple rounds of antibiotics apparently with some improvement but the erythema is persists. He apparently had a fracture in the ankle 2 months ago and was seen by Dr. Lajoyce Corners . He apparently had a splint applied and more recently a heel offloading boot. He had home health coming out to a week ago not clear what they are dressing this with but they apparently discharged him perhaps because of one of Dr. 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 He has a Nurse, adult for transferring at home he is nonambulatory and does not wear footwear. He is not a diabetic. Looking through [facility] he appears to have fairly active primary care. The area on the ankle is variably labeled as a pressure area and/or chronic venous insufficiency. He had a recent venous ultrasound on 5/25 that did not show a DVT in the left leg however this was not a reflux study. There was no superficial DVT either. Arterial studies on 10/15/14 showed a left ABI of 1.16 Center right of 1.12 waveforms were triphasic he does not have significant arterial disease. He has had recent x-rays of the left foot and ankle. The left foot did not show any abnormality. Left ankle x-ray showed a spiral fracture of the left tibial diaphysis without evidence of osteomyelitis. 11/30/15 culture I did last week showed pseudomonas I changed him to  oral ciprofloxacin. Erythema is a lot better. 12/07/15 area around the wound shows considerable improvement of the periwound erythema. 12/14/15; the patient has a improvement of the periwound erythema which in retrospect I think with cellulitis  successfully treated. We have been using Iodoflex started last week 12/25/15 wound on the left medial malleolus and dorsal left foot. Theraskin #1 applied READMISSION 02/27/16; the patient was admitted to hospital from 8/14 through 01/18/16 initially felt to have cellulitis of his left lower leg however he was noted to have significant persistent pain and swelling. A duplex ultrasound was ordered that was negative. Obie Dredge an x-ray was done that showed a spiral fracture of the left leg. With posterior displacement and angulation at the mid left tibia. Orthopedics saw the patient and splinted the leg. He was then sent to [facility] skilled facility and only return to his group home last week. He has wounds on the left medial malleolus, left dorsal foot and a worrisome area on the plantar left heel which is not really open but may represent a hematoma or a DTI 03/06/16; areas of left anterior and medial ankle both look improved. There are 3 small wounds here. He has a area over the dorsal left first metatarsal head which is not open. In meantime his left heel has opened 03/14/16 the medial left ankle wounds looks stable to improved. The area on the posterior left heel has opened up and had a very adherent eschar and slept over this 03/21/16 patient has 3 wounds on his left medial ankle is stable area on his left dorsal first metatarsal head appears to be improved. Continued problems with adherent eschar over the left heel 03/28/16; patient has 3 wounds on his left medial ankle and lower leg all of these appear to be stable. The problem continues to be the left heel and the adherent eschar. This may have been a pressure ulcer from her brace at one point. He has a English as a second language teacher we have been using [employer] and all wound areas. Dressings are being changed by home health 04/04/16; the patient's areas on the medial aspect of the left ankle looks superficial and appear to be gradually closing. The area on  the plantar aspects/Achilles aspect of his left heel as surface slough and nonviable subcutaneous tissue requiring debridement. 04/11/16; the areas on the patient's medial left ankle appear to be superficial and appear to be closing. The area on the plantar heel on the left has surface slough and nonviable subcutaneous tissue requiring debridement. I don't think this is too much different from last week there is also undermining 04/25/16; patient is followed in his group home by [facility]. The area on the plantar heel is smaller but with still with some depth. We're using Iodoflex to this. The areas on the left ankle looks healthy but dry and somewhat larger. Cause of this is not really clear using Prisma 05/02/16; areas x3 all look improved expecially the areas on the medial ankle. Heel wound is smaller but still deep 05/09/16; all areas o3 look improved on the medial ankle. Heel wound is also contracting. 12/0.9/17 the areas on his left medial ankle are all closed. I think these are largely chronic venous insufficiency however there is no edema. The area on his left heel was a pressure area. READMISSION 08/02/16; this man is a patient that we have had in this clinic on several times before. I believe he has cerebral palsy is nonambulatory. He has chronic venous insufficiency with  venous inflammation. He was last year in the fall of 2017 with areas on his left medial ankle left heel and left dorsal foot. He arrives today with once again a vague history. As mentioned he is nonambulatory he spends most of his day in a pressure offloading boot nevertheless they noted wounds in this area on the medial left ankle 2 weeks ago. There is any other history here I am uncertain 08/15/16; the patient's wounds on his medial ankle actually look better however the area on the left lateral/dorsal foot is covered in black necrotic eschar. Previous formal arterial studies did not show significant arterial  disease with normal ABIs and triphasic waveforms. He is immobilized and at risk for pressure although he seems aware protective boots at all times as far as I am aware. 08/22/16; in general his wounds look better. All the wounds on his medial left ankle look improved the area on his lateral foot however continues to be problematic. Patient comes in the clinic today in a new wheelchair he is quite delighted 09/05/16; a wound on the left medial Calf and left dorsal foot are healed. The area on the left lateral foot is improved however the area on the left medial malleolus is inflamed with a considerable degree of surrounding erythema. This looks like cellulitis. ABIs and clinical exam are within normal limits 09/12/16; the patient continues to have a wound on the left medial calf. I was concerned about cellulitis in this area last week and gave him a course of doxycycline. The wound looks better here. The area on the left lateral foot however is unchanged 09/20/16; the patient came in this week early after traumatizing his toe on the left foot on a table while moving around in his motorized wheelchair. 09/26/16- he is here for follow-up evaluation of his left foot and malleolus ulcers. Since his appointment he abraded his left medial hallux against a table while in his motorized wheelchair, apparently trying to pull up to the table for a meal. He continues to complain of tenderness to the left lateral foot, surrounding the current ulcer 10/07/16; left foot and malleolus ulcers. 10/14/16; the patient is doing well. He has a wound on the left lateral, left medial malleolus and atraumatic wound on the left great toe. Although these appear to be superficial and closing 10/31/16; the patient is doing well. The area on the left lateral foot, left medial malleolus and the left great toe or all epithelialized. He has home health 11/14/16; patient arrives today with the area on the left great toe healed. The left lateral  foot however in the left medial malleolus and the ankle superficial wounds that are slightly larger. He has erythema and tenderness around the area on the left lateral foot. As well he is complaining of dysuria. His caregiver notes that when he gets like this he is more lethargic which indeed seems to be the case today 11/21/16; left great toe is still healed. His wound on the left lateral foot appears healthy and slightly smaller also the area on the left medial ankle. This had black eschar on it which I removed. The remaining wound looks satisfactory 12/12/16; patient hasn't been here in a number of weeks. The area on the left lateral foot is healed however on his medial ankle has 2 open areas one of them covered with a necrotic eschar. He also has a new wound on the right second toe which was apparently trauma induced while he was at a movie  12/27/16; the patient has wounds on his left medial mallelus and left lateral foot that are just about closed. The area on the right second toe which was trauma from last time is still open but again everything looks a lot better here. 01/30/17; patient arrives in clinic today and is really been a long time since I've seen him. The wounds are all epithelialized on the left foot this includes his left lateral foot, left medial malleolus. Apparently was in the ER for what of I think was a fungal infection on his buttock's he was prescribed Diflucan and doxycycline I think this is already getting better. READMISSION T is a now 59 year old man who has advanced cerebral palsy and functional quadriplegia. We have had him in this clinic multiple times before for wounds erry on his right ankle, left ankle, left dorsal foot and most recently from 08/02/16 through 01/30/17 with his left medial ankle and left foot and finally right second toe. His attendant from the group home where he lives is very knowledgeable about him. She states that they noticed breakdown in his skin in his  feet sometime in January although they could not get an appointment until today in this clinic. The previous wounds have always been felt to be secondary to incidental trauma/pressure areas or possibly some degree of chronic venous insufficiency. He had normal arterial studies in may of 2016. He has definite open wounds on the right lateral foot over the base of the fifth metatarsal, the right second toe DIP, the left medial malleolus. He has concerning areas over the left lateral fifth metatarsal base, retaform purpura over the medial left ankle and perhaps the medial left first toe. 07/21/17; culture we did last week from the base of the right fifth metatarsal grew group G strep. This should've been sensitive to the doxycycline I gave him. The other major wounds are on the right second toe DIP, left medial malleolus, base of the left lateral fifth metatarsal and the medial left ankle and medial left toe. 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 We have macrovascular arterial studies scheduled for Thursday although I don't think this is what the problem is. 07/28/17; patient arrives with really no improvement. He has a wound on the right second toe, lateral right foot, right medial malleolus which was new this week, left medial malleolus, but looked like a DTI on the left great toe. His macrovascular studies are on 07/30/17 although I would be surprised if this provides all the explanation. He has small areas even on his right second and third toe which look like splinter-type hemorrhages. The question I have is where is this coming from. I'd like to see the vascular studies however before we pursue this. He has had a DVT in the past but is no longer on anticoagulants. I'll need to review his history 08/04/17; the patient had his ARTERIAL STUDIES this showed a right ABI of 1.16 and the left ABI of 1.20. TBI on the left at 0.84. He is at a right first toe amputation. The studies were  unchanged from studies in May 2016. It was not felt that he had significant lower extremity arterial disease In the meantime we are using silver alginate to the wounds on the right second toe right lateral foot left medial malleolus. I'm going to change to Silver collagen today. 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 Working  him up for this would include an echocardiogram probably a CT scan of the chest and abdomen to look at the major thoracic and abdominal aortic vessels. I am not going to involve myself in this unless there is more symptoms/signs 08/18/17; the patient arrives today with wounds looking somewhat better. The remaining areas are the right second toe dorsal, right lateral foot and left medial malleolus which only has a small open area. We've been using silver collagen 09/01/17;right second toe dorsally probes to bone today. This is a deterioration. We've been using silver collagen. Only a small open area remains on the left medial malleolus. 09/09/17; right second toe appears better. There is no probing bone. He has a small area remaining on the left medial malleolus, the right lateral foot. We have been using silver collagen 10/07/17; the patient returns to clinic today after a month hiatus. He has wounds over the right second toe, right lateral foot and the left medial malleolus. We have been using silver collagen. He has home health. 10/24/17; patient has open wounds over the right second toe, now the left fourth toe and a superficial area over his left buttock. Some of the other areas have healed including the left medial malleolus and right lateral foot. Using silver collagen on these wounds 11/07/17; the patient has closed his wounds on the right second toe and left fourth toe although they look all normal. Last time he was here he had a superficial area  over the left buttock this is also closed. The right lateral foot wound I had closed out last time although it is seems to have reopened today. 628/19 the areas over the right second and left fourth toe remains closed. The right lateral foot has tightly adherent necrotic surface over. We have been using silver collagen he offloads this and a bunny boot 12/12/17; the patient has reopened the recurrent area over the right second PIP joint. 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 He does not have obvious macrovascular disease. The area over the right lateral foot looks about the same as last time tightly adherent necrotic debris. We've been using Hydrofera Blue 12/25/17; 2 week follow-up. The area over the right second PIP is closed however the area on the right lateral foot is worse with surrounding erythema and pain compatible with cellulitis is going to need an antibiotic. ooAS WELL..... We have looked at his buttocks and lower back. He has 3 stage II ulcers. The exact history here is unclear however he is very disabled man with cerebral palsy. He goes to a day program from the group home in which she lives 5 days a week and I think he is on his buttock all day on these visits. Surrounding the wounds see has extensive stage I injury as well. ooFINALLY it looks as though he has lost weight. I'm not the only staff member that is recognized this 01/08/18 on evaluation today patient appears to be doing better in regard to his gluteal wounds. In fact everything appears to be healed back here although very dry. He has a couple open sores on the scrotal region other than this it is really his foot that is still giving him the most trouble. He did complete the Keflex unfortunately it seems like there's still a lot of erythema in this in fact looks worse than it did previous. I'm gonna culture this today and likely start with a different  antibiotic.  01/16/18; I was reassured that the wounds on his buttocks and sacrum are healed. I did not look at these the day. The culture that was done last week on 01/11/18 of the right lateral foot wounds showed Pseudomonas. Bactrim is not a good choice for this. Ciprofloxacin interacts with his muscle relaxants therefore I prescribed cefdinir 300 twice a day for 7 days. Bactrim coverage of pseudomonas is not reliable although the degree of erythema looks a lot better 01/30/18; the patient has completed his antibiotics. The area does not have infection on the right lateral foot. The wounds look a lot better. We've been using silver alginate 02/12/18; there is on the right lateral foot. Copious amounts of surface eschar. Also similarly over the PIP of the right second toe. We've been using silver alginate to date 03/02/2018; the area that remains is on the right lateral foot. The right second toe is healed. 03/16/2018; the area there is original wound on the right lateral foot. Still requiring debridement. We are using Hydrofera Blue. The right second toe is still healed/closed 03/30/2018; right lateral foot there is 2 small open areas remaining. They actually look quite healthy we have been using Hydrofera Blue the right second toe remains closed 04/13/2018; right lateral foot. 1 of the 2 small open areas from last time has closed over. The other had surface slough that I removed with a #3 curette. This looks quite healthy. The right second toe remains closed. We are using Hydrofera Blue 05/01/2018; right lateral foot very superficial areas that look like they are trying to close over. He had a new threatened area on the left medial ankle just below the medial malleolus. I think these represent chronic venous insufficiency areas 05/14/2018; the right right lateral foot looks like it closed over as was predicted last time however he has some dark areas subcutaneously erythema superiorly and this is really  very tender. This is either coexistent cellulitis or perhaps a deep tissue injury. The area on the left medial ankle is superficial but has some size. We have been using Hydrofera Blue to both areas. The patient complains of pain in his right foot 06/09/2018; right lateral foot was closed over last time but there was coexistent cellulitis around this. I gave him Levaquin. He also has an area on the left medial malleolus. He has not been back to clinic in over 3-1/2 weeks. We are using Hydrofera Blue I think to the left medial malleolus and silver alginate to the right lateral foot 1/21; right lateral foot still non-viable debris over this area and the left medial malleolus. Both of these vigorously washed off with antiseptic gauze. We have been using silver alginate. 2/4; right lateral foot this looks somewhat better. Unfortunately the area over the left medial malleolus has deteriorated. Using silver alginate in both area 2/18; both wounds arrived today covered in a lot of necrotic debris. We have been using silver alginate. Clearly not making a lot of progress. 3/3; the patient's right second toe is reopened over the PIP. I am not sure why this is happening. He may have microvascular disease. We have checked his macrovascular flow in the past and found it to be normal. He may need an x-ray. The area on the right lateral foot requires debridement. The area on the left lateral ankle is worse. We have been using PolyMem Ag 08/26/18 on evaluation today patient appears to be doing a little worse in regard to his lower extremities based on what I'm seeing  what the nursing staff is telling me. His left medial ankle appears potential to be infected there is your thing on want surrounding the wound. His right second toe is also of concern at this point in my pinion. We have not done x-rays of these areas that may be a good idea to go ahead and proceed with at this point to ensure will not deal with anything  such as osteomyelitis. 4/16; it is been a while since I saw this patient. He now has wounds on the right lateral foot, dorsal right second toe, substantially on the left medial malleolus and a small area on the left lateral foot. We have been using PolyMem Silver. Curlex and Coban. He has home health changing the dressing. Since last time he was here he had a culture of the left medial ankle that showed MRSA. He is completed a course/7 days of doxycycline. XRAYS of the left ankle and right foot that were ordered were negative for osteomyelitis 4/30; he arrives in clinic today with some maceration around the wound and a lot of tenderness and erythema in a circumferential area around the right lateral foot. He has wound areas on the right lateral foot dorsal right second toe, largely over the left medial malleolus and a small area over the left lateral foot 5/7; completed his antibiotics from last time right lateral foot cellulitis is better and the area on his left knee which we discovered after he had been seen by the provider also is improved. He still has extensive wound areas over the right lateral foot, left medial malleolus, right second toe and left lateral foot although all of these look somewhat better with PolyMem 5/15. Wounds on the right lateral foot, right second toe left medial malleolus and the left lateral foot. All of these look somewhat better. We have been using PolyMem Ag 5/26; the wound on the right lateral foot is almost closed over but he still complains of a lot of pain in this area. He has a eschared area on the right second toe but no open wound.Geoffry Paradise concerning today his original wound on the left medial malleolus looks healthy however just inferior to this almost areas of deep tissue injury and there is an area on the left lateral foot also looks like a deep tissue injury. In the past I have wondered whether he might have cholesterol emboli or some other form of a  vasculopathy. I wonder whether this could have something to do with this. 6/2; right lateral foot wound is still open I gave him empiric antibiotics last week without really a lot of change she is still complaining of pain in this area. We x- rayed his feet at some point in April these were negative. It would be difficult to do an MRI. The area on his left medial malleolus looks better. Right second toe is healed. He still has what looks to be a small pressure related area on the left lateral foot but it is not open. We are using PolyMem to all wound which seems to be doing nicely 6/16; right lateral foot wound is still open. The wound looks better although there is still a lot of tenderness around it. Rather than more empiric antibiotics I have cultured this today. We are using PolyMem Ag. On the left lateral malleolus the wound appears better. He has a new area on the left lower anterior pretibial area 6/29; 2-week follow-up. Culture I did of the right lateral foot 2 weeks  ago showed MRSA. I gave him 2 weeks of Septra which he should be just about finishing now and this seems better. We got a call from home health last week to report swelling in the left leg because we were listed as his primary. We referred him back to primary care. He arrives today with intense erythema and tenderness around the 2 wounds on the left medial ankle on the left lower calf. The area on the right lateral foot looks better 7/7; I brought him back this week to follow-up on his left foot which had cellulitis last week. I gave him clindamycin which as far as I know he tolerated. The erythema is better. He has a 3 current wounds one on the right lateral foot a small area on the right lateral malleolus and the more recent area on the left anterior pretibial area. 7/21; bilateral distal lower leg and foot wounds. The area on his right lateral foot looks a lot better. He has an area on his left pretibial area distally. The area  on the medial malleolus on the left which is 1 of his worst wounds has closed over. He does not appear to have any open areas in his remaining toes 8/4-Patient returns at 2 weeks, he has the remaining wound on the left lower leg the other 2 wounds have healed. Patient is continued to be treated with silver alginate with a 2 layer wrap on the left 8/18; I been following this patient for a long period for bilateral lower extremity wounds. 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 His wounds are probably pressure although he religiously wears thick bunny boots. He does not have macrovascular disease but I did formal testing on him perhaps 2 or 3 years ago. I have often wondered about either cholesterol emboli or a vasculopathy of some sort given the distal nature of these variable locations etc. He has 2 areas that we have been following one on the left medial lower leg and one on the right lateral foot. Both of these are mostly healed today there is some eschar over sections of both areas although I did not disturb this. 03/31/2019 on evaluation today patient appears to be doing a little bit more poorly in regard to the sacral region. He has 2 small wounds noted at this point. Fortunately there is no signs of active infection at this time. No fevers, chills, nausea, vomiting, or diarrhea. He is having some pain when he sits on his gluteal region a big part of the reason he has these wounds is likely due to the fact that his wheelchair has not been functioning properly. He does still have a couple areas of deep tissue injury on his lower extremities he does need new Prevalon offloading boots as well. 04/28/2019 on evaluation today patient appears to be doing well with regard to his wounds in particular. His ankle area has fairly small based on what I am seeing currently and his sacral region actually is still open but does not appear to be doing too poorly  in my opinion. Fortunately there is no signs of active infection at this time. He was in the hospital unfortunately and this was from 04/08/2019 through 04/12/2019 secondary to persistent diarrhea, dehydration and acute kidney injury. With that being said fortunately the patient does not seem to be doing too poorly at this time which is good news. He is having pain mainly in the sacral region. 06/23/2019 on  evaluation today patient appears to be doing well with regard to his sacral region which in fact is completely healed. Unfortunately he has an area of rubbing/pressure on his right lateral chest/back region. This is where it is rubbing up on his chair. Subsequently he also has an open wound on the left medial malleolus which is not very open but starting to crack and then on the right lateral foot this is much more open at this point. Unfortunately there is no signs of significant improvement in regard to these areas and in fact there does appear to be evidence of active infection quite significantly. No fevers, chills, nausea, vomiting, or diarrhea. The patient did test positive for COVID-19 that has been greater than 14 days ago although they did not know the exact timing. That is why he was not here for his last appointment. 07/07/2019 upon evaluation today patient actually appears to be doing quite well in regard to his left ankle as well as the chest/back region which is doing great in fact appears to be healed in my opinion. He still has an issue on his lateral right foot that is open and appears to be infected but does have me more concerned. In fact I do believe the doxycycline helped in general for the cellulitis but I believe that may have been more MRSA related based on the culture that we did. What I am seeing right now has more of the blue-green drainage and may be more consistent with a Pseudomonas that I was hoping was not really causing much of an issue but I think it may be at this  point on the right lateral foot. For that reason I do think treatment would be a good idea of the reason I avoided the Cipro before was the potential for QT prolongation which could obviously cause problems with the patient with results of interaction with respiratory wound. The tizanidine also had some interactions as well. Nonetheless I think at this point that it would be a possibility for us to use topical gentamicin to see if this could be of benefit for the patient underneath the silver alginate dressing. 07/21/2019 upon evaluation today patient appears to be doing fairly well at this point in regard to his lower extremity on the right. He does have a wound on his right lateral foot as well as the right fifth toe and the right fourth toe. Unfortunately he continues to experience significant issues with pressure and trauma to some degree and I think this is just due to the fact that again he is not really not able to control his legs and what is happening here. Fortunately there is no signs of active infection at this time. 08/04/2019 upon evaluation today patient actually appears to be doing quite well with regard to his toe ulcers I am very pleased in that regard. Unfortunately the foot ulcer is showing some signs of erythema his primary care provider has placed him on clindamycin at this point. Again I do believe this can be beneficial for him. With that being said I do think the patient may benefit from a light Kerlix and Coban wrap to help with some of his edema at this point. 08/18/2019 upon evaluation today patient appears to be doing still poorly in regard to his lateral foot ulcer. This is not very deep but there is some concern about the possibility of osteomyelitis. I think it would be appropriate for us to go ahead and have an x-ray at this  time. The patient is currently on clindamycin which from his culture which was performed on 06/23/2019 it does appear that he would benefit from that in  regard to the MRSA. However Pseudomonas was also identified then and the patient actually has some blue-green drainage at this point as well. I am much more concerned about the possibility of the Pseudomonas being an infection is causative agent at this point that I am the MRSA. Subsequently Cipro the patient was sensitive to but we never use that is stable use a topical gent due to the fact that unfortunately there was some interaction between the Cipro and the patient's risk for down potentially causing QT prolongation. 08/31/2019; have not seen this patient in quite a period of time. He has a substantial wound on his right lateral foot we have been using gentamicin ointment with Hydrofera Blue over the top. From what I am able to determine he is also on antibiotics perhaps clindamycin although I have not had a chance to look over this. He also has an eschared area on the right fifth toe as well as the right second toe. 4/20; right lateral foot wound. He comes in today with intense erythema and tenderness around this wound. The wound itself has a surface on it but it doesn't look particularly healthy a lot of gelatinous debris. There is no palpable bone. We've been using gentamicin ointment and Hydrofera Blue. Since the patient was last here he was seen by Dr. Algis Liming of infectious disease. He ordered an MRI that I think is due later this month on 4/30. He apparently also checked a sedimentation rate and C-reactive protein metabolic panel CBC with differential. At the time he was seen he wanted to withhold any microbials pending identification of the depth of the infection although looking at this I don't know that I'm going to be able to hold off for another 10 days. 4/29; he went on to have the MRI that was ordered by Dr. Algis Liming of infectious disease. 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 I had given him cefdinir last week for surrounding cellulitis he is  finishing this today. I have communicated with Dr. Algis Liming who wants bone for pathology and culture before proceeding with consideration of IV antibiotics. Fortunately he is due to see Dr. Samuella Cota of podiatry today I am hopeful that the bone biopsy and culture will be considered there 5/4 as I understand things Dr. Samuella Cota actually wants to remove the diseased proximal fifth metatarsal. Looking back at this decision is probably not a bad one. He has good blood flow he is nonambulatory they are apparently going to do this next week. Paradoxically the wound on the lateral part of his foot is actually a lot better. The cellulitis that was so problematic 2 weeks ago also has resolved. 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 The last time I saw him he had a diseased proximal fifth metatarsal. 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 Samuella Cota. Culture of this showed Pseudomonas and he was followed by infectious disease Dr. Algis Liming treated with ciprofloxacin. 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 He had Integra placed. A CandS showed Pseudomonas again. Since then he has had Silvadene as a primary dressing, then Santyl and more recently Xeroform  and then back to Santyl 3 times a week per his attendant who comes with him. Reviewing his records I was really not prepared for the number of wounds that he currently has. This includes substantial areas on the right lateral foot, right lateral ankle, right fourth toe dorsally, right ankle anteriorly as well as the left lateral ankle and foot. His medical history is unchanged ABIs 1.2 on the right and 1.21 on the left he has not been known to have arterial insufficiency and is not a diabetic 11/5; patient I readmitted to the clinic last week. He has multiple wounds on  the right lateral foot, right fourth toe and a new area on the right second toe. He has areas on the right dorsal ankle and a substantive wound over the left lateral ankle and foot. We are using silver collagen. We put him under compression last week and this seems to have helped. He has home health coming out one other time per week to change the dressing 11/12; no real improvement. Multiple bilateral foot and ankle wounds. ooOn the left he has a substantial wound on the left lateral ankle ooOn the right he has wounds on the dorsal second and fourth toes. ooRight lateral foot we have merged to wounds these are necrotic ooShe has an area on the right dorsal ankle and a area on the right medial malleolus with marked surrounding erythema likely cellulitis. The patient complains about pain that kept him awake all last night from wraps that were too tight. He is not known to have arterial insufficiency. Objective Constitutional Sitting or standing Blood Pressure is within target range for patient.. Pulse regular and within target range for patient.Marland Kitchen Respirations regular, non-labored and within target range.. Temperature is normal and within the target range for the patient.Marland Kitchen Appears in no distress. Vitals Time Taken: 8:55 AM, Temperature: 98.2 F, Pulse: 82 bpm, Respiratory Rate: 18 breaths/min, Blood Pressure: 125/79 mmHg. General Notes: Wound exam ooSubstantial area on the right lateral foot which is necrotic although I did not attempt to debride this today. These are deep but no palpable bone ooAnterior ankle and medial malleolus. Again wounds or not that viable looking. Furthermore on the medial malleolus there is surrounding erythema and tenderness likely cellulitis. ooHe has areas on the dorsal second and fourth toe neither 1 of these look particularly healthy ooSubstantial area on the left medial ankle this does not look too bad. Healthy looking tissue although it bleeds very easily per  our intake nurse Integumentary (Hair, Skin) Wound #40 status is Open. Original cause of wound was Gradually Appeared. The wound is located on the Left,Medial Lower Leg. The wound measures 4.5cm length x 5cm width x 0.1cm depth; 17.671cm^2 area and 1.767cm^3 volume. There is Fat Layer (Subcutaneous Tissue) exposed. There is no tunneling or undermining noted. There is a large amount of serosanguineous drainage noted. The wound margin is flat and intact. There is large (67-100%) red, friable granulation within the wound bed. There is a small (1-33%) amount of necrotic tissue within the wound bed including Adherent Slough. Wound #41 status is Open. Original cause of wound was Trauma. The wound is located on the Right T Fourth. The wound measures 1.6cm length x 1.2cm oe width x 0.1cm depth; 1.508cm^2 area and 0.151cm^3 volume. There is Fat Layer (Subcutaneous Tissue) exposed. There is no tunneling or undermining noted. There is a small amount of serosanguineous drainage noted. The wound margin is flat and intact. There is medium (34-66%) pink granulation within  the wound bed. There is a medium (34-66%) amount of necrotic tissue within the wound bed including Adherent Slough. Wound #42 status is Open. Original cause of wound was Gradually Appeared. The wound is located on the Right,Medial Malleolus. The wound measures 1.3cm length x 2.8cm width x 0.1cm depth; 2.859cm^2 area and 0.286cm^3 volume. There is Fat Layer (Subcutaneous Tissue) exposed. There is no tunneling or undermining noted. There is a medium amount of serosanguineous drainage noted. The wound margin is flat and intact. There is medium (34-66%) pink, friable granulation within the wound bed. There is a medium (34-66%) amount of necrotic tissue within the wound bed including Adherent Slough. Wound #43 status is Open. Original cause of wound was Pressure Injury. The wound is located on the Right,Distal,Anterior Lower Leg. The wound  measures 3.5cm length x 1cm width x 0.1cm depth; 2.749cm^2 area and 0.275cm^3 volume. There is Fat Layer (Subcutaneous Tissue) exposed. There is no tunneling or undermining noted. There is a small amount of serosanguineous drainage noted. The wound margin is flat and intact. There is small (1-33%) red granulation within the wound bed. There is a large (67-100%) amount of necrotic tissue within the wound bed including Eschar and Adherent Slough. Wound #44 status is Open. Original cause of wound was Pressure Injury. The wound is located on the Right,Proximal,Lateral Foot. The wound measures 5.4cm length x 2cm width x 0.3cm depth; 8.482cm^2 area and 2.545cm^3 volume. There is Fat Layer (Subcutaneous Tissue) exposed. There is no tunneling or undermining noted. There is a large amount of serosanguineous drainage noted. The wound margin is flat and intact. There is small (1-33%) granulation within the wound bed. There is a large (67-100%) amount of necrotic tissue within the wound bed including Eschar and Adherent Slough. General Notes: converted 45 into 44 Wound #45 status is Converted. Original cause of wound was Pressure Injury. The wound is located on the Right,Distal,Lateral Foot. There is a none present amount of drainage noted. The wound margin is flat and intact. There is no granulation within the wound bed. There is no necrotic tissue within the wound bed. General Notes: converted 44 into 45 Wound #46 status is Open. Original cause of wound was Gradually Appeared. The wound is located on the Right T Second. The wound measures 0.5cm oe length x 0.8cm width x 0.1cm depth; 0.314cm^2 area and 0.031cm^3 volume. There is Fat Layer (Subcutaneous Tissue) exposed. There is no tunneling or undermining noted. There is a medium amount of serosanguineous drainage noted. The wound margin is flat and intact. There is small (1-33%) pink granulation within the wound bed. There is a large (67-100%) amount of  necrotic tissue within the wound bed including Eschar and Adherent Slough. 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 - Thurs. 8am 11/18 Dressing Change Frequency: Other: - change twice a week. once at wound center and once at home health. Skin Barriers/Peri-Wound Care: Moisturizing lotion - both legs. Wound Cleansing: Clean wound with Wound Cleanser May shower with protection. - use cast protectors. Primary Wound Dressing: Wound #40 Left,Medial Lower Leg: Hydrofera Blue - classic moistened with saline Wound #41 Right T Fourth: oe Iodoflex  Wound #42 Right,Medial Malleolus: Iodoflex Wound #43 Right,Distal,Anterior Lower Leg: Iodoflex Wound #44 Right,Proximal,Lateral Foot: Iodoflex Wound #46 Right T Second: oe Iodoflex Secondary Dressing: Wound #41 Right T Fourth: oe Kerlix/Rolled Gauze Dry Gauze Wound #42 Right,Medial Malleolus: Dry Gauze ABD pad Wound #43 Right,Distal,Anterior Lower Leg: Dry Gauze ABD pad Wound #44 Right,Proximal,Lateral Foot: Dry Gauze ABD pad Wound #46 Right T Second: oe Kerlix/Rolled Gauze Dry Gauze Edema Control: Kerlix and Coban - Bilateral Elevate legs to the level of the heart or above for 30 minutes daily and/or when sitting, a frequency of: - throughout the day. Off-Loading: Multipodus Splint to: - or bunny boots to both feet. float feet while resting in chair and bed. Home Health: Continue Home Health skilled nursing for wound care. - [facility] The following  medication(s) was prescribed: Levaquin oral 500 mg tablet 1 tablet oral daily for 7 days for cellulits right ankle 1. I put Hydrofera Blue on the left lateral 2. Iodoflex to the rest of the wounds on the right to help with ongoing debridement 3. The areas on the right will require mechanical debridement but I was not prepared to do this today this is going to be very painful 4. Because of his complaints about the wraps being too tight I reduced him to kerlix Coban from 3 layer compression. He is not known to have arterial insufficiency 5. Because of the extensive nature of this wound area on the right foot I will see him next week 6. Empiric Levaquin 500 that a day for a week with regards to the right medial ankle. This was chosen because of the history of Pseudomonas. I did not do any cultures. 7. I wonder about advanced imaging of the right foot Electronic Signature(s) Signed: 04/10/2020 9:07:52 AM By: Baltazar Najjar MD Entered By: Baltazar Najjar on 04/07/2020 09:54:06 -------------------------------------------------------------------------------- SuperBill Details Patient Name: Date of Service: [name] 04/07/2020 Medical Record Number: [id number] Patient Account Number: [id number] Date of Birth/Sex: Treating RN: 04-Jun-1960 (59 y.o. [name] Primary Care Provider: [name] Other Clinician: Referring Provider: Treating Provider/Extender: [name], [name] in Treatment: 2 Diagnosis Coding ICD-10 Codes Code Description I89.0 Lymphedema, not elsewhere classified T81.31XD Disruption of external operation (surgical) wound, not elsewhere classified, subsequent encounter S90.811D Abrasion, right foot, subsequent encounter 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 L97.322 Non-pressure chronic ulcer of left ankle with fat layer  exposed L03.115 Cellulitis of right lower limb Facility Procedures CPT4 Code: [id number] Description: 42595 - WOUND CARE VISIT-LEV 5 EST PT Modifier: Quantity: 1 Physician Procedures Electronic Signature(s) Signed: 04/10/2020 9:07:52 AM By: Baltazar Najjar MD Entered By: Baltazar Najjar on 04/07/2020 09:54:31

## 2020-04-13 ENCOUNTER — Encounter (HOSPITAL_BASED_OUTPATIENT_CLINIC_OR_DEPARTMENT_OTHER): Payer: Medicare Other | Admitting: Internal Medicine

## 2020-04-13 ENCOUNTER — Other Ambulatory Visit: Payer: Self-pay

## 2020-04-13 DIAGNOSIS — I890 Lymphedema, not elsewhere classified: Secondary | ICD-10-CM | POA: Diagnosis not present

## 2020-04-13 NOTE — Progress Notes (Signed)
42 Right,Medial Malleolus: Dry Gauze ABD pad Wound #43 Right,Distal,Anterior Lower Leg: Dry Gauze ABD pad Wound #44 Right,Proximal,Lateral  Foot: Dry Gauze ABD pad Wound #46 Right T Second: oe Kerlix/Rolled Gauze Dry Gauze Edema Control: Kerlix and Coban - Bilateral Elevate legs to the level of the heart or above for 30 minutes daily and/or when sitting, a frequency of: - throughout the day. Off-Loading: Multipodus Splint to: - or bunny boots to both feet. float feet while resting in chair and bed. Home Health: Continue Home Health skilled nursing for wound care. - [facility] #1 Iodoflex to all the wounds on the right foot Hydrofera Blue the area on the left medial ankle 2. I gave him empiric Levaquin for the degree of erythema here that does not seem to have worked. Previously he has had Pseudomonas in these wounds which was the logic care 3. I did not do any mechanical debridement today although unfortunately I think there is mechanical debridement that will need to be done especially on the right lateral foot. 4. I cannot rule out having to image this foot both with plain x-ray and perhaps more than that. The extent of the tissue damage here is so bad that if this deteriorates he might end up with an amputation Electronic Signature(s) Signed: 04/13/2020 5:14:03 PM By: Baltazar Najjarobson, Sharlisa Hollifield MD Entered By: Baltazar Najjarobson, Lezli Danek on 04/13/2020 12:58:36 -------------------------------------------------------------------------------- SuperBill Details Patient Name: Date of Service: [name] RhineHUMBLE, [name] 04/13/2020 Medical Record Number: [id number] Patient Account Number: [id number] Date of Birth/Sex: Treating RN: Oct 03, 1960 (59 y.o. 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 left 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 L03.115 Cellulitis of right lower limb Facility Procedures CPT4 Code: [id number] Description: [id number] - WOUND CARE VISIT-LEV 5 EST PT Modifier: Quantity: 1 Physician Procedures : CPT4 Code Description Modifier [id number] - WC PHYS LEVEL 3 - EST PT ICD-10 Diagnosis Description L97.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 layer exposed L97.312 Non-pressure chronic ulcer of right ankle with fat layer  exposed L97.322 Non-pressure chronic ulcer of left 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 Quantity: 1 Electronic Signature(s) Signed: 04/13/2020 5:14:03 PM By: Baltazar Najjarobson, Delle Andrzejewski MD Entered By: Baltazar Najjarobson, Channah Godeaux on 04/13/2020 12:58:57  Second oe Kerlix/Rolled Gauze Dry Gauze Edema Control Kerlix and Coban - Bilateral Elevate legs to the level of the heart or above for 30 minutes daily and/or when sitting, a frequency of: - throughout the day. Off-Loading Multipodus Splint to: - or bunny boots to both feet. float feet while resting in chair and bed. Home Health Continue Home Health skilled nursing for wound care. [facility] Electronic Signature(s) Signed: 04/13/2020  5:14:03 PM By: Baltazar Najjar MD Signed: 04/13/2020 5:38:55 PM By: Shawn Stall Entered By: Shawn Stall on 04/13/2020 12:25:45 -------------------------------------------------------------------------------- Problem List Details Patient Name: Date of Service: [name] Rhine. 04/13/2020 11:00 A M Medical Record Number: [id number] Patient Account Number: [id number] Date of Birth/Sex: Treating RN: September 05, 1960 (59 y.o. [name] Primary Care Provider: [name] Other Clinician: Referring Provider: Treating Provider/Extender: [name], [name] in Treatment: 3 Active Problems ICD-10 Encounter Code Description Active Date MDM Diagnosis I89.0 Lymphedema, not elsewhere classified 03/23/2020 No Yes T81.31XD Disruption of external operation (surgical) wound, not elsewhere classified, 03/23/2020 No Yes subsequent encounter S90.811D Abrasion, right foot, subsequent encounter 03/23/2020 No Yes L97.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 layer exposed 03/23/2020 No Yes L97.312 Non-pressure chronic ulcer of right 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 03/23/2020 No Yes L97.322 Non-pressure chronic ulcer of left 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 03/23/2020 No Yes L03.115 Cellulitis of right lower limb 04/07/2020 No Yes Inactive Problems Resolved Problems Electronic Signature(s) Signed: 04/13/2020 5:14:03 PM By: Baltazar Najjar MD Entered By: Baltazar Najjar on 04/13/2020 13:35:18 -------------------------------------------------------------------------------- Progress Note Details Patient Name: Date of Service: [name] Rhine. 04/13/2020 11:00 A M Medical Record Number: [id number] Patient Account Number: [id number] Date of Birth/Sex: Treating RN: March 15, 1961 (59 y.o. [name] Primary Care Provider: [name] Other Clinician: Referring Provider: Treating Provider/Extender: [name], [name] in  Treatment: 3 Subjective Objective Constitutional Patient is hypertensive.. Pulse regular and within target range for patient.Marland Kitchen Respirations regular, non-labored and within target range.. Temperature is normal and within the target range for the patient.Marland Kitchen Appears in no distress. Vitals Time Taken: 11:27 AM, Temperature: 97.7 F, Pulse: 81 bpm, Respiratory Rate: 18 breaths/min, Blood Pressure: 160/95 mmHg. General Notes: Wound exam; the right foot simply does not look good. Substantial area on the right lateral foot. I put Iodoflex on this last week but still has some necrotic surface there is no exposed bone. ooRight anterior ankle and medial malleolus again absolutely no healing. There is erythema around a lot of this but not really responding to the Levaquin I gave him empirically last time ooOn the left medial ankle this is a much better looking surface. Granulated with a rim of epithelialization. Integumentary (Hair, Skin) Wound #40 status is Open. Original cause of wound was Gradually Appeared. The wound is located on the Left,Medial Lower Leg. The wound measures 4.3cm length x 5cm width x 0.1cm depth; 16.886cm^2 area and 1.689cm^3 volume. There is Fat Layer (Subcutaneous Tissue) exposed. There is no tunneling or undermining noted. There is a large amount of serosanguineous drainage noted. The wound margin is flat and intact. There is medium (34-66%) red, friable granulation within the wound bed. There is a medium (34-66%) amount of necrotic tissue within the wound bed including Adherent Slough. Wound #41 status is Open. Original cause of wound was Trauma. The wound is located on the Right T Fourth. The wound measures 1.5cm length x 1.3cm oe width x 0.2cm depth; 1.532cm^2 area and 0.306cm^3 volume. There is Fat Layer (  Second oe Kerlix/Rolled Gauze Dry Gauze Edema Control Kerlix and Coban - Bilateral Elevate legs to the level of the heart or above for 30 minutes daily and/or when sitting, a frequency of: - throughout the day. Off-Loading Multipodus Splint to: - or bunny boots to both feet. float feet while resting in chair and bed. Home Health Continue Home Health skilled nursing for wound care. [facility] Electronic Signature(s) Signed: 04/13/2020  5:14:03 PM By: Baltazar Najjar MD Signed: 04/13/2020 5:38:55 PM By: Shawn Stall Entered By: Shawn Stall on 04/13/2020 12:25:45 -------------------------------------------------------------------------------- Problem List Details Patient Name: Date of Service: [name] Rhine. 04/13/2020 11:00 A M Medical Record Number: [id number] Patient Account Number: [id number] Date of Birth/Sex: Treating RN: September 05, 1960 (59 y.o. [name] Primary Care Provider: [name] Other Clinician: Referring Provider: Treating Provider/Extender: [name], [name] in Treatment: 3 Active Problems ICD-10 Encounter Code Description Active Date MDM Diagnosis I89.0 Lymphedema, not elsewhere classified 03/23/2020 No Yes T81.31XD Disruption of external operation (surgical) wound, not elsewhere classified, 03/23/2020 No Yes subsequent encounter S90.811D Abrasion, right foot, subsequent encounter 03/23/2020 No Yes L97.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 layer exposed 03/23/2020 No Yes L97.312 Non-pressure chronic ulcer of right 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 03/23/2020 No Yes L97.322 Non-pressure chronic ulcer of left 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 03/23/2020 No Yes L03.115 Cellulitis of right lower limb 04/07/2020 No Yes Inactive Problems Resolved Problems Electronic Signature(s) Signed: 04/13/2020 5:14:03 PM By: Baltazar Najjar MD Entered By: Baltazar Najjar on 04/13/2020 13:35:18 -------------------------------------------------------------------------------- Progress Note Details Patient Name: Date of Service: [name] Rhine. 04/13/2020 11:00 A M Medical Record Number: [id number] Patient Account Number: [id number] Date of Birth/Sex: Treating RN: March 15, 1961 (59 y.o. [name] Primary Care Provider: [name] Other Clinician: Referring Provider: Treating Provider/Extender: [name], [name] in  Treatment: 3 Subjective Objective Constitutional Patient is hypertensive.. Pulse regular and within target range for patient.Marland Kitchen Respirations regular, non-labored and within target range.. Temperature is normal and within the target range for the patient.Marland Kitchen Appears in no distress. Vitals Time Taken: 11:27 AM, Temperature: 97.7 F, Pulse: 81 bpm, Respiratory Rate: 18 breaths/min, Blood Pressure: 160/95 mmHg. General Notes: Wound exam; the right foot simply does not look good. Substantial area on the right lateral foot. I put Iodoflex on this last week but still has some necrotic surface there is no exposed bone. ooRight anterior ankle and medial malleolus again absolutely no healing. There is erythema around a lot of this but not really responding to the Levaquin I gave him empirically last time ooOn the left medial ankle this is a much better looking surface. Granulated with a rim of epithelialization. Integumentary (Hair, Skin) Wound #40 status is Open. Original cause of wound was Gradually Appeared. The wound is located on the Left,Medial Lower Leg. The wound measures 4.3cm length x 5cm width x 0.1cm depth; 16.886cm^2 area and 1.689cm^3 volume. There is Fat Layer (Subcutaneous Tissue) exposed. There is no tunneling or undermining noted. There is a large amount of serosanguineous drainage noted. The wound margin is flat and intact. There is medium (34-66%) red, friable granulation within the wound bed. There is a medium (34-66%) amount of necrotic tissue within the wound bed including Adherent Slough. Wound #41 status is Open. Original cause of wound was Trauma. The wound is located on the Right T Fourth. The wound measures 1.5cm length x 1.3cm oe width x 0.2cm depth; 1.532cm^2 area and 0.306cm^3 volume. There is Fat Layer (  42 Right,Medial Malleolus: Dry Gauze ABD pad Wound #43 Right,Distal,Anterior Lower Leg: Dry Gauze ABD pad Wound #44 Right,Proximal,Lateral  Foot: Dry Gauze ABD pad Wound #46 Right T Second: oe Kerlix/Rolled Gauze Dry Gauze Edema Control: Kerlix and Coban - Bilateral Elevate legs to the level of the heart or above for 30 minutes daily and/or when sitting, a frequency of: - throughout the day. Off-Loading: Multipodus Splint to: - or bunny boots to both feet. float feet while resting in chair and bed. Home Health: Continue Home Health skilled nursing for wound care. - [facility] #1 Iodoflex to all the wounds on the right foot Hydrofera Blue the area on the left medial ankle 2. I gave him empiric Levaquin for the degree of erythema here that does not seem to have worked. Previously he has had Pseudomonas in these wounds which was the logic care 3. I did not do any mechanical debridement today although unfortunately I think there is mechanical debridement that will need to be done especially on the right lateral foot. 4. I cannot rule out having to image this foot both with plain x-ray and perhaps more than that. The extent of the tissue damage here is so bad that if this deteriorates he might end up with an amputation Electronic Signature(s) Signed: 04/13/2020 5:14:03 PM By: Baltazar Najjarobson, Sharlisa Hollifield MD Entered By: Baltazar Najjarobson, Lezli Danek on 04/13/2020 12:58:36 -------------------------------------------------------------------------------- SuperBill Details Patient Name: Date of Service: [name] RhineHUMBLE, [name] 04/13/2020 Medical Record Number: [id number] Patient Account Number: [id number] Date of Birth/Sex: Treating RN: Oct 03, 1960 (59 y.o. 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 left 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 L03.115 Cellulitis of right lower limb Facility Procedures CPT4 Code: [id number] Description: [id number] - WOUND CARE VISIT-LEV 5 EST PT Modifier: Quantity: 1 Physician Procedures : CPT4 Code Description Modifier [id number] - WC PHYS LEVEL 3 - EST PT ICD-10 Diagnosis Description L97.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 layer exposed L97.312 Non-pressure chronic ulcer of right ankle with fat layer  exposed L97.322 Non-pressure chronic ulcer of left 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 Quantity: 1 Electronic Signature(s) Signed: 04/13/2020 5:14:03 PM By: Baltazar Najjarobson, Delle Andrzejewski MD Entered By: Baltazar Najjarobson, Channah Godeaux on 04/13/2020 12:58:57  42 Right,Medial Malleolus: Dry Gauze ABD pad Wound #43 Right,Distal,Anterior Lower Leg: Dry Gauze ABD pad Wound #44 Right,Proximal,Lateral  Foot: Dry Gauze ABD pad Wound #46 Right T Second: oe Kerlix/Rolled Gauze Dry Gauze Edema Control: Kerlix and Coban - Bilateral Elevate legs to the level of the heart or above for 30 minutes daily and/or when sitting, a frequency of: - throughout the day. Off-Loading: Multipodus Splint to: - or bunny boots to both feet. float feet while resting in chair and bed. Home Health: Continue Home Health skilled nursing for wound care. - [facility] #1 Iodoflex to all the wounds on the right foot Hydrofera Blue the area on the left medial ankle 2. I gave him empiric Levaquin for the degree of erythema here that does not seem to have worked. Previously he has had Pseudomonas in these wounds which was the logic care 3. I did not do any mechanical debridement today although unfortunately I think there is mechanical debridement that will need to be done especially on the right lateral foot. 4. I cannot rule out having to image this foot both with plain x-ray and perhaps more than that. The extent of the tissue damage here is so bad that if this deteriorates he might end up with an amputation Electronic Signature(s) Signed: 04/13/2020 5:14:03 PM By: Baltazar Najjarobson, Sharlisa Hollifield MD Entered By: Baltazar Najjarobson, Lezli Danek on 04/13/2020 12:58:36 -------------------------------------------------------------------------------- SuperBill Details Patient Name: Date of Service: [name] RhineHUMBLE, [name] 04/13/2020 Medical Record Number: [id number] Patient Account Number: [id number] Date of Birth/Sex: Treating RN: Oct 03, 1960 (59 y.o. 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 left 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 L03.115 Cellulitis of right lower limb Facility Procedures CPT4 Code: [id number] Description: [id number] - WOUND CARE VISIT-LEV 5 EST PT Modifier: Quantity: 1 Physician Procedures : CPT4 Code Description Modifier [id number] - WC PHYS LEVEL 3 - EST PT ICD-10 Diagnosis Description L97.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 layer exposed L97.312 Non-pressure chronic ulcer of right ankle with fat layer  exposed L97.322 Non-pressure chronic ulcer of left 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 Quantity: 1 Electronic Signature(s) Signed: 04/13/2020 5:14:03 PM By: Baltazar Najjarobson, Delle Andrzejewski MD Entered By: Baltazar Najjarobson, Channah Godeaux on 04/13/2020 12:58:57  has an area on the right dorsal ankle and a area on the right medial malleolus with marked surrounding erythema likely cellulitis. The patient complains about pain that kept him awake all last night from wraps that were too tight. He is not known to have arterial insufficiency. 11/18; no real change. I gave him empiric Levaquin last week directed at previously cultured Pseudomonas this is not really helped. On the right he has wounds on the right lateral foot right lateral ankle right medial ankle and the dorsal second and fourth toes. Most of these do not have a nice have a healthy surface. There is erythema in his foot which may all be venous stasis. The area on the left lateral malleolus/ankle area looks better Electronic Signature(s) Signed:  04/13/2020 5:14:03 PM By: Baltazar Najjar MD Entered By: Baltazar Najjar on 04/13/2020 13:36:37 -------------------------------------------------------------------------------- Physical Exam Details Patient Name: Date of Service: HUMBLEVirgel Gess. 04/13/2020 11:00 A M Medical Record Number: [id number] Patient Account Number: [id number] Date of Birth/Sex: Treating RN: 1960/09/02 (59 y.o. [name] Primary Care Provider: [name] Other Clinician: Referring Provider: Treating Provider/Extender: [name], [name] in Treatment: 3 Constitutional Patient is hypertensive.. Pulse regular and within target range for patient.Marland Kitchen Respirations regular, non-labored and within target range.. Temperature is normal and within the target range for the patient.Marland Kitchen Appears in no distress. Notes Wound exam; the right foot simply does not look good. Substantial area on the right lateral foot. I put Iodoflex on this last week but still has some necrotic surface there is no exposed bone. Right anterior ankle and medial malleolus again absolutely no healing. There is erythema around a lot of this but not really responding to the Levaquin I gave him empirically last time On the left medial ankle this is a much better looking surface. Granulated with a rim of epithelialization. Electronic Signature(s) Signed: 04/13/2020 5:14:03 PM By: Baltazar Najjar MD Entered By: Baltazar Najjar on 04/13/2020 12:51:56 -------------------------------------------------------------------------------- Physician Orders Details Patient Name: Date of Service: [name] Rhine. 04/13/2020 11:00 A M Medical Record Number: [id number] Patient Account Number: [id number] Date of Birth/Sex: Treating RN: [birth year] (59 y.o. 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 Dressing Change Frequency Other: - change twice a week. once at wound center and once at home health. Skin Barriers/Peri-Wound Care Moisturizing lotion - both legs. Wound Cleansing Clean wound with Wound Cleanser May shower with protection. - use cast protectors. Primary Wound Dressing Wound #40 Left,Medial Lower Leg Hydrofera Blue - classic moistened with saline Wound #41 Right T Fourth oe Iodoflex Wound #42 Right,Medial Malleolus Iodoflex Wound #43 Right,Distal,Anterior Lower Leg Iodoflex Wound #44 Right,Proximal,Lateral Foot Iodoflex Wound #46 Right T Second oe Iodoflex Secondary Dressing Wound #41 Right T Fourth oe Kerlix/Rolled Gauze Dry Gauze Wound #42 Right,Medial Malleolus Dry Gauze ABD pad Wound #43 Right,Distal,Anterior Lower Leg Dry Gauze ABD pad Wound #44 Right,Proximal,Lateral Foot Dry Gauze ABD pad Wound #46 Right T  has an area on the right dorsal ankle and a area on the right medial malleolus with marked surrounding erythema likely cellulitis. The patient complains about pain that kept him awake all last night from wraps that were too tight. He is not known to have arterial insufficiency. 11/18; no real change. I gave him empiric Levaquin last week directed at previously cultured Pseudomonas this is not really helped. On the right he has wounds on the right lateral foot right lateral ankle right medial ankle and the dorsal second and fourth toes. Most of these do not have a nice have a healthy surface. There is erythema in his foot which may all be venous stasis. The area on the left lateral malleolus/ankle area looks better Electronic Signature(s) Signed:  04/13/2020 5:14:03 PM By: Baltazar Najjar MD Entered By: Baltazar Najjar on 04/13/2020 13:36:37 -------------------------------------------------------------------------------- Physical Exam Details Patient Name: Date of Service: HUMBLEVirgel Gess. 04/13/2020 11:00 A M Medical Record Number: [id number] Patient Account Number: [id number] Date of Birth/Sex: Treating RN: 1960/09/02 (59 y.o. [name] Primary Care Provider: [name] Other Clinician: Referring Provider: Treating Provider/Extender: [name], [name] in Treatment: 3 Constitutional Patient is hypertensive.. Pulse regular and within target range for patient.Marland Kitchen Respirations regular, non-labored and within target range.. Temperature is normal and within the target range for the patient.Marland Kitchen Appears in no distress. Notes Wound exam; the right foot simply does not look good. Substantial area on the right lateral foot. I put Iodoflex on this last week but still has some necrotic surface there is no exposed bone. Right anterior ankle and medial malleolus again absolutely no healing. There is erythema around a lot of this but not really responding to the Levaquin I gave him empirically last time On the left medial ankle this is a much better looking surface. Granulated with a rim of epithelialization. Electronic Signature(s) Signed: 04/13/2020 5:14:03 PM By: Baltazar Najjar MD Entered By: Baltazar Najjar on 04/13/2020 12:51:56 -------------------------------------------------------------------------------- Physician Orders Details Patient Name: Date of Service: [name] Rhine. 04/13/2020 11:00 A M Medical Record Number: [id number] Patient Account Number: [id number] Date of Birth/Sex: Treating RN: [birth year] (59 y.o. 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 Dressing Change Frequency Other: - change twice a week. once at wound center and once at home health. Skin Barriers/Peri-Wound Care Moisturizing lotion - both legs. Wound Cleansing Clean wound with Wound Cleanser May shower with protection. - use cast protectors. Primary Wound Dressing Wound #40 Left,Medial Lower Leg Hydrofera Blue - classic moistened with saline Wound #41 Right T Fourth oe Iodoflex Wound #42 Right,Medial Malleolus Iodoflex Wound #43 Right,Distal,Anterior Lower Leg Iodoflex Wound #44 Right,Proximal,Lateral Foot Iodoflex Wound #46 Right T Second oe Iodoflex Secondary Dressing Wound #41 Right T Fourth oe Kerlix/Rolled Gauze Dry Gauze Wound #42 Right,Medial Malleolus Dry Gauze ABD pad Wound #43 Right,Distal,Anterior Lower Leg Dry Gauze ABD pad Wound #44 Right,Proximal,Lateral Foot Dry Gauze ABD pad Wound #46 Right T  has an area on the right dorsal ankle and a area on the right medial malleolus with marked surrounding erythema likely cellulitis. The patient complains about pain that kept him awake all last night from wraps that were too tight. He is not known to have arterial insufficiency. 11/18; no real change. I gave him empiric Levaquin last week directed at previously cultured Pseudomonas this is not really helped. On the right he has wounds on the right lateral foot right lateral ankle right medial ankle and the dorsal second and fourth toes. Most of these do not have a nice have a healthy surface. There is erythema in his foot which may all be venous stasis. The area on the left lateral malleolus/ankle area looks better Electronic Signature(s) Signed:  04/13/2020 5:14:03 PM By: Baltazar Najjar MD Entered By: Baltazar Najjar on 04/13/2020 13:36:37 -------------------------------------------------------------------------------- Physical Exam Details Patient Name: Date of Service: HUMBLEVirgel Gess. 04/13/2020 11:00 A M Medical Record Number: [id number] Patient Account Number: [id number] Date of Birth/Sex: Treating RN: 1960/09/02 (59 y.o. [name] Primary Care Provider: [name] Other Clinician: Referring Provider: Treating Provider/Extender: [name], [name] in Treatment: 3 Constitutional Patient is hypertensive.. Pulse regular and within target range for patient.Marland Kitchen Respirations regular, non-labored and within target range.. Temperature is normal and within the target range for the patient.Marland Kitchen Appears in no distress. Notes Wound exam; the right foot simply does not look good. Substantial area on the right lateral foot. I put Iodoflex on this last week but still has some necrotic surface there is no exposed bone. Right anterior ankle and medial malleolus again absolutely no healing. There is erythema around a lot of this but not really responding to the Levaquin I gave him empirically last time On the left medial ankle this is a much better looking surface. Granulated with a rim of epithelialization. Electronic Signature(s) Signed: 04/13/2020 5:14:03 PM By: Baltazar Najjar MD Entered By: Baltazar Najjar on 04/13/2020 12:51:56 -------------------------------------------------------------------------------- Physician Orders Details Patient Name: Date of Service: [name] Rhine. 04/13/2020 11:00 A M Medical Record Number: [id number] Patient Account Number: [id number] Date of Birth/Sex: Treating RN: [birth year] (59 y.o. 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 Dressing Change Frequency Other: - change twice a week. once at wound center and once at home health. Skin Barriers/Peri-Wound Care Moisturizing lotion - both legs. Wound Cleansing Clean wound with Wound Cleanser May shower with protection. - use cast protectors. Primary Wound Dressing Wound #40 Left,Medial Lower Leg Hydrofera Blue - classic moistened with saline Wound #41 Right T Fourth oe Iodoflex Wound #42 Right,Medial Malleolus Iodoflex Wound #43 Right,Distal,Anterior Lower Leg Iodoflex Wound #44 Right,Proximal,Lateral Foot Iodoflex Wound #46 Right T Second oe Iodoflex Secondary Dressing Wound #41 Right T Fourth oe Kerlix/Rolled Gauze Dry Gauze Wound #42 Right,Medial Malleolus Dry Gauze ABD pad Wound #43 Right,Distal,Anterior Lower Leg Dry Gauze ABD pad Wound #44 Right,Proximal,Lateral Foot Dry Gauze ABD pad Wound #46 Right T  42 Right,Medial Malleolus: Dry Gauze ABD pad Wound #43 Right,Distal,Anterior Lower Leg: Dry Gauze ABD pad Wound #44 Right,Proximal,Lateral  Foot: Dry Gauze ABD pad Wound #46 Right T Second: oe Kerlix/Rolled Gauze Dry Gauze Edema Control: Kerlix and Coban - Bilateral Elevate legs to the level of the heart or above for 30 minutes daily and/or when sitting, a frequency of: - throughout the day. Off-Loading: Multipodus Splint to: - or bunny boots to both feet. float feet while resting in chair and bed. Home Health: Continue Home Health skilled nursing for wound care. - [facility] #1 Iodoflex to all the wounds on the right foot Hydrofera Blue the area on the left medial ankle 2. I gave him empiric Levaquin for the degree of erythema here that does not seem to have worked. Previously he has had Pseudomonas in these wounds which was the logic care 3. I did not do any mechanical debridement today although unfortunately I think there is mechanical debridement that will need to be done especially on the right lateral foot. 4. I cannot rule out having to image this foot both with plain x-ray and perhaps more than that. The extent of the tissue damage here is so bad that if this deteriorates he might end up with an amputation Electronic Signature(s) Signed: 04/13/2020 5:14:03 PM By: Baltazar Najjarobson, Sharlisa Hollifield MD Entered By: Baltazar Najjarobson, Lezli Danek on 04/13/2020 12:58:36 -------------------------------------------------------------------------------- SuperBill Details Patient Name: Date of Service: [name] RhineHUMBLE, [name] 04/13/2020 Medical Record Number: [id number] Patient Account Number: [id number] Date of Birth/Sex: Treating RN: Oct 03, 1960 (59 y.o. 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 left 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 L03.115 Cellulitis of right lower limb Facility Procedures CPT4 Code: [id number] Description: [id number] - WOUND CARE VISIT-LEV 5 EST PT Modifier: Quantity: 1 Physician Procedures : CPT4 Code Description Modifier [id number] - WC PHYS LEVEL 3 - EST PT ICD-10 Diagnosis Description L97.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 layer exposed L97.312 Non-pressure chronic ulcer of right ankle with fat layer  exposed L97.322 Non-pressure chronic ulcer of left 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 Quantity: 1 Electronic Signature(s) Signed: 04/13/2020 5:14:03 PM By: Baltazar Najjarobson, Delle Andrzejewski MD Entered By: Baltazar Najjarobson, Channah Godeaux on 04/13/2020 12:58:57  has an area on the right dorsal ankle and a area on the right medial malleolus with marked surrounding erythema likely cellulitis. The patient complains about pain that kept him awake all last night from wraps that were too tight. He is not known to have arterial insufficiency. 11/18; no real change. I gave him empiric Levaquin last week directed at previously cultured Pseudomonas this is not really helped. On the right he has wounds on the right lateral foot right lateral ankle right medial ankle and the dorsal second and fourth toes. Most of these do not have a nice have a healthy surface. There is erythema in his foot which may all be venous stasis. The area on the left lateral malleolus/ankle area looks better Electronic Signature(s) Signed:  04/13/2020 5:14:03 PM By: Baltazar Najjar MD Entered By: Baltazar Najjar on 04/13/2020 13:36:37 -------------------------------------------------------------------------------- Physical Exam Details Patient Name: Date of Service: HUMBLEVirgel Gess. 04/13/2020 11:00 A M Medical Record Number: [id number] Patient Account Number: [id number] Date of Birth/Sex: Treating RN: 1960/09/02 (59 y.o. [name] Primary Care Provider: [name] Other Clinician: Referring Provider: Treating Provider/Extender: [name], [name] in Treatment: 3 Constitutional Patient is hypertensive.. Pulse regular and within target range for patient.Marland Kitchen Respirations regular, non-labored and within target range.. Temperature is normal and within the target range for the patient.Marland Kitchen Appears in no distress. Notes Wound exam; the right foot simply does not look good. Substantial area on the right lateral foot. I put Iodoflex on this last week but still has some necrotic surface there is no exposed bone. Right anterior ankle and medial malleolus again absolutely no healing. There is erythema around a lot of this but not really responding to the Levaquin I gave him empirically last time On the left medial ankle this is a much better looking surface. Granulated with a rim of epithelialization. Electronic Signature(s) Signed: 04/13/2020 5:14:03 PM By: Baltazar Najjar MD Entered By: Baltazar Najjar on 04/13/2020 12:51:56 -------------------------------------------------------------------------------- Physician Orders Details Patient Name: Date of Service: [name] Rhine. 04/13/2020 11:00 A M Medical Record Number: [id number] Patient Account Number: [id number] Date of Birth/Sex: Treating RN: [birth year] (59 y.o. 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 Dressing Change Frequency Other: - change twice a week. once at wound center and once at home health. Skin Barriers/Peri-Wound Care Moisturizing lotion - both legs. Wound Cleansing Clean wound with Wound Cleanser May shower with protection. - use cast protectors. Primary Wound Dressing Wound #40 Left,Medial Lower Leg Hydrofera Blue - classic moistened with saline Wound #41 Right T Fourth oe Iodoflex Wound #42 Right,Medial Malleolus Iodoflex Wound #43 Right,Distal,Anterior Lower Leg Iodoflex Wound #44 Right,Proximal,Lateral Foot Iodoflex Wound #46 Right T Second oe Iodoflex Secondary Dressing Wound #41 Right T Fourth oe Kerlix/Rolled Gauze Dry Gauze Wound #42 Right,Medial Malleolus Dry Gauze ABD pad Wound #43 Right,Distal,Anterior Lower Leg Dry Gauze ABD pad Wound #44 Right,Proximal,Lateral Foot Dry Gauze ABD pad Wound #46 Right T  has an area on the right dorsal ankle and a area on the right medial malleolus with marked surrounding erythema likely cellulitis. The patient complains about pain that kept him awake all last night from wraps that were too tight. He is not known to have arterial insufficiency. 11/18; no real change. I gave him empiric Levaquin last week directed at previously cultured Pseudomonas this is not really helped. On the right he has wounds on the right lateral foot right lateral ankle right medial ankle and the dorsal second and fourth toes. Most of these do not have a nice have a healthy surface. There is erythema in his foot which may all be venous stasis. The area on the left lateral malleolus/ankle area looks better Electronic Signature(s) Signed:  04/13/2020 5:14:03 PM By: Baltazar Najjar MD Entered By: Baltazar Najjar on 04/13/2020 13:36:37 -------------------------------------------------------------------------------- Physical Exam Details Patient Name: Date of Service: HUMBLEVirgel Gess. 04/13/2020 11:00 A M Medical Record Number: [id number] Patient Account Number: [id number] Date of Birth/Sex: Treating RN: 1960/09/02 (59 y.o. [name] Primary Care Provider: [name] Other Clinician: Referring Provider: Treating Provider/Extender: [name], [name] in Treatment: 3 Constitutional Patient is hypertensive.. Pulse regular and within target range for patient.Marland Kitchen Respirations regular, non-labored and within target range.. Temperature is normal and within the target range for the patient.Marland Kitchen Appears in no distress. Notes Wound exam; the right foot simply does not look good. Substantial area on the right lateral foot. I put Iodoflex on this last week but still has some necrotic surface there is no exposed bone. Right anterior ankle and medial malleolus again absolutely no healing. There is erythema around a lot of this but not really responding to the Levaquin I gave him empirically last time On the left medial ankle this is a much better looking surface. Granulated with a rim of epithelialization. Electronic Signature(s) Signed: 04/13/2020 5:14:03 PM By: Baltazar Najjar MD Entered By: Baltazar Najjar on 04/13/2020 12:51:56 -------------------------------------------------------------------------------- Physician Orders Details Patient Name: Date of Service: [name] Rhine. 04/13/2020 11:00 A M Medical Record Number: [id number] Patient Account Number: [id number] Date of Birth/Sex: Treating RN: [birth year] (59 y.o. 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 Dressing Change Frequency Other: - change twice a week. once at wound center and once at home health. Skin Barriers/Peri-Wound Care Moisturizing lotion - both legs. Wound Cleansing Clean wound with Wound Cleanser May shower with protection. - use cast protectors. Primary Wound Dressing Wound #40 Left,Medial Lower Leg Hydrofera Blue - classic moistened with saline Wound #41 Right T Fourth oe Iodoflex Wound #42 Right,Medial Malleolus Iodoflex Wound #43 Right,Distal,Anterior Lower Leg Iodoflex Wound #44 Right,Proximal,Lateral Foot Iodoflex Wound #46 Right T Second oe Iodoflex Secondary Dressing Wound #41 Right T Fourth oe Kerlix/Rolled Gauze Dry Gauze Wound #42 Right,Medial Malleolus Dry Gauze ABD pad Wound #43 Right,Distal,Anterior Lower Leg Dry Gauze ABD pad Wound #44 Right,Proximal,Lateral Foot Dry Gauze ABD pad Wound #46 Right T  Second oe Kerlix/Rolled Gauze Dry Gauze Edema Control Kerlix and Coban - Bilateral Elevate legs to the level of the heart or above for 30 minutes daily and/or when sitting, a frequency of: - throughout the day. Off-Loading Multipodus Splint to: - or bunny boots to both feet. float feet while resting in chair and bed. Home Health Continue Home Health skilled nursing for wound care. [facility] Electronic Signature(s) Signed: 04/13/2020  5:14:03 PM By: Baltazar Najjar MD Signed: 04/13/2020 5:38:55 PM By: Shawn Stall Entered By: Shawn Stall on 04/13/2020 12:25:45 -------------------------------------------------------------------------------- Problem List Details Patient Name: Date of Service: [name] Rhine. 04/13/2020 11:00 A M Medical Record Number: [id number] Patient Account Number: [id number] Date of Birth/Sex: Treating RN: September 05, 1960 (59 y.o. [name] Primary Care Provider: [name] Other Clinician: Referring Provider: Treating Provider/Extender: [name], [name] in Treatment: 3 Active Problems ICD-10 Encounter Code Description Active Date MDM Diagnosis I89.0 Lymphedema, not elsewhere classified 03/23/2020 No Yes T81.31XD Disruption of external operation (surgical) wound, not elsewhere classified, 03/23/2020 No Yes subsequent encounter S90.811D Abrasion, right foot, subsequent encounter 03/23/2020 No Yes L97.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 layer exposed 03/23/2020 No Yes L97.312 Non-pressure chronic ulcer of right 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 03/23/2020 No Yes L97.322 Non-pressure chronic ulcer of left 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 03/23/2020 No Yes L03.115 Cellulitis of right lower limb 04/07/2020 No Yes Inactive Problems Resolved Problems Electronic Signature(s) Signed: 04/13/2020 5:14:03 PM By: Baltazar Najjar MD Entered By: Baltazar Najjar on 04/13/2020 13:35:18 -------------------------------------------------------------------------------- Progress Note Details Patient Name: Date of Service: [name] Rhine. 04/13/2020 11:00 A M Medical Record Number: [id number] Patient Account Number: [id number] Date of Birth/Sex: Treating RN: March 15, 1961 (59 y.o. [name] Primary Care Provider: [name] Other Clinician: Referring Provider: Treating Provider/Extender: [name], [name] in  Treatment: 3 Subjective Objective Constitutional Patient is hypertensive.. Pulse regular and within target range for patient.Marland Kitchen Respirations regular, non-labored and within target range.. Temperature is normal and within the target range for the patient.Marland Kitchen Appears in no distress. Vitals Time Taken: 11:27 AM, Temperature: 97.7 F, Pulse: 81 bpm, Respiratory Rate: 18 breaths/min, Blood Pressure: 160/95 mmHg. General Notes: Wound exam; the right foot simply does not look good. Substantial area on the right lateral foot. I put Iodoflex on this last week but still has some necrotic surface there is no exposed bone. ooRight anterior ankle and medial malleolus again absolutely no healing. There is erythema around a lot of this but not really responding to the Levaquin I gave him empirically last time ooOn the left medial ankle this is a much better looking surface. Granulated with a rim of epithelialization. Integumentary (Hair, Skin) Wound #40 status is Open. Original cause of wound was Gradually Appeared. The wound is located on the Left,Medial Lower Leg. The wound measures 4.3cm length x 5cm width x 0.1cm depth; 16.886cm^2 area and 1.689cm^3 volume. There is Fat Layer (Subcutaneous Tissue) exposed. There is no tunneling or undermining noted. There is a large amount of serosanguineous drainage noted. The wound margin is flat and intact. There is medium (34-66%) red, friable granulation within the wound bed. There is a medium (34-66%) amount of necrotic tissue within the wound bed including Adherent Slough. Wound #41 status is Open. Original cause of wound was Trauma. The wound is located on the Right T Fourth. The wound measures 1.5cm length x 1.3cm oe width x 0.2cm depth; 1.532cm^2 area and 0.306cm^3 volume. There is Fat Layer (  Second oe Kerlix/Rolled Gauze Dry Gauze Edema Control Kerlix and Coban - Bilateral Elevate legs to the level of the heart or above for 30 minutes daily and/or when sitting, a frequency of: - throughout the day. Off-Loading Multipodus Splint to: - or bunny boots to both feet. float feet while resting in chair and bed. Home Health Continue Home Health skilled nursing for wound care. [facility] Electronic Signature(s) Signed: 04/13/2020  5:14:03 PM By: Baltazar Najjar MD Signed: 04/13/2020 5:38:55 PM By: Shawn Stall Entered By: Shawn Stall on 04/13/2020 12:25:45 -------------------------------------------------------------------------------- Problem List Details Patient Name: Date of Service: [name] Rhine. 04/13/2020 11:00 A M Medical Record Number: [id number] Patient Account Number: [id number] Date of Birth/Sex: Treating RN: September 05, 1960 (59 y.o. [name] Primary Care Provider: [name] Other Clinician: Referring Provider: Treating Provider/Extender: [name], [name] in Treatment: 3 Active Problems ICD-10 Encounter Code Description Active Date MDM Diagnosis I89.0 Lymphedema, not elsewhere classified 03/23/2020 No Yes T81.31XD Disruption of external operation (surgical) wound, not elsewhere classified, 03/23/2020 No Yes subsequent encounter S90.811D Abrasion, right foot, subsequent encounter 03/23/2020 No Yes L97.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 layer exposed 03/23/2020 No Yes L97.312 Non-pressure chronic ulcer of right 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 03/23/2020 No Yes L97.322 Non-pressure chronic ulcer of left 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 03/23/2020 No Yes L03.115 Cellulitis of right lower limb 04/07/2020 No Yes Inactive Problems Resolved Problems Electronic Signature(s) Signed: 04/13/2020 5:14:03 PM By: Baltazar Najjar MD Entered By: Baltazar Najjar on 04/13/2020 13:35:18 -------------------------------------------------------------------------------- Progress Note Details Patient Name: Date of Service: [name] Rhine. 04/13/2020 11:00 A M Medical Record Number: [id number] Patient Account Number: [id number] Date of Birth/Sex: Treating RN: March 15, 1961 (59 y.o. [name] Primary Care Provider: [name] Other Clinician: Referring Provider: Treating Provider/Extender: [name], [name] in  Treatment: 3 Subjective Objective Constitutional Patient is hypertensive.. Pulse regular and within target range for patient.Marland Kitchen Respirations regular, non-labored and within target range.. Temperature is normal and within the target range for the patient.Marland Kitchen Appears in no distress. Vitals Time Taken: 11:27 AM, Temperature: 97.7 F, Pulse: 81 bpm, Respiratory Rate: 18 breaths/min, Blood Pressure: 160/95 mmHg. General Notes: Wound exam; the right foot simply does not look good. Substantial area on the right lateral foot. I put Iodoflex on this last week but still has some necrotic surface there is no exposed bone. ooRight anterior ankle and medial malleolus again absolutely no healing. There is erythema around a lot of this but not really responding to the Levaquin I gave him empirically last time ooOn the left medial ankle this is a much better looking surface. Granulated with a rim of epithelialization. Integumentary (Hair, Skin) Wound #40 status is Open. Original cause of wound was Gradually Appeared. The wound is located on the Left,Medial Lower Leg. The wound measures 4.3cm length x 5cm width x 0.1cm depth; 16.886cm^2 area and 1.689cm^3 volume. There is Fat Layer (Subcutaneous Tissue) exposed. There is no tunneling or undermining noted. There is a large amount of serosanguineous drainage noted. The wound margin is flat and intact. There is medium (34-66%) red, friable granulation within the wound bed. There is a medium (34-66%) amount of necrotic tissue within the wound bed including Adherent Slough. Wound #41 status is Open. Original cause of wound was Trauma. The wound is located on the Right T Fourth. The wound measures 1.5cm length x 1.3cm oe width x 0.2cm depth; 1.532cm^2 area and 0.306cm^3 volume. There is Fat Layer (  has an area on the right dorsal ankle and a area on the right medial malleolus with marked surrounding erythema likely cellulitis. The patient complains about pain that kept him awake all last night from wraps that were too tight. He is not known to have arterial insufficiency. 11/18; no real change. I gave him empiric Levaquin last week directed at previously cultured Pseudomonas this is not really helped. On the right he has wounds on the right lateral foot right lateral ankle right medial ankle and the dorsal second and fourth toes. Most of these do not have a nice have a healthy surface. There is erythema in his foot which may all be venous stasis. The area on the left lateral malleolus/ankle area looks better Electronic Signature(s) Signed:  04/13/2020 5:14:03 PM By: Baltazar Najjar MD Entered By: Baltazar Najjar on 04/13/2020 13:36:37 -------------------------------------------------------------------------------- Physical Exam Details Patient Name: Date of Service: HUMBLEVirgel Gess. 04/13/2020 11:00 A M Medical Record Number: [id number] Patient Account Number: [id number] Date of Birth/Sex: Treating RN: 1960/09/02 (59 y.o. [name] Primary Care Provider: [name] Other Clinician: Referring Provider: Treating Provider/Extender: [name], [name] in Treatment: 3 Constitutional Patient is hypertensive.. Pulse regular and within target range for patient.Marland Kitchen Respirations regular, non-labored and within target range.. Temperature is normal and within the target range for the patient.Marland Kitchen Appears in no distress. Notes Wound exam; the right foot simply does not look good. Substantial area on the right lateral foot. I put Iodoflex on this last week but still has some necrotic surface there is no exposed bone. Right anterior ankle and medial malleolus again absolutely no healing. There is erythema around a lot of this but not really responding to the Levaquin I gave him empirically last time On the left medial ankle this is a much better looking surface. Granulated with a rim of epithelialization. Electronic Signature(s) Signed: 04/13/2020 5:14:03 PM By: Baltazar Najjar MD Entered By: Baltazar Najjar on 04/13/2020 12:51:56 -------------------------------------------------------------------------------- Physician Orders Details Patient Name: Date of Service: [name] Rhine. 04/13/2020 11:00 A M Medical Record Number: [id number] Patient Account Number: [id number] Date of Birth/Sex: Treating RN: [birth year] (59 y.o. 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 Dressing Change Frequency Other: - change twice a week. once at wound center and once at home health. Skin Barriers/Peri-Wound Care Moisturizing lotion - both legs. Wound Cleansing Clean wound with Wound Cleanser May shower with protection. - use cast protectors. Primary Wound Dressing Wound #40 Left,Medial Lower Leg Hydrofera Blue - classic moistened with saline Wound #41 Right T Fourth oe Iodoflex Wound #42 Right,Medial Malleolus Iodoflex Wound #43 Right,Distal,Anterior Lower Leg Iodoflex Wound #44 Right,Proximal,Lateral Foot Iodoflex Wound #46 Right T Second oe Iodoflex Secondary Dressing Wound #41 Right T Fourth oe Kerlix/Rolled Gauze Dry Gauze Wound #42 Right,Medial Malleolus Dry Gauze ABD pad Wound #43 Right,Distal,Anterior Lower Leg Dry Gauze ABD pad Wound #44 Right,Proximal,Lateral Foot Dry Gauze ABD pad Wound #46 Right T

## 2020-04-14 NOTE — Progress Notes (Signed)
Neuropathy, Quadriplegia, Confinement Anxiety 02/21/2020] [41:Quadriplegia, Confinement Anxiety 02/21/2020] [42:Quadriplegia, Confinement  Anxiety 02/21/2020] Date Acquired: [40:3] [41:3] [42:3] Weeks of Treatment: [40:Open] [41:Open] [42:Open] Wound Status: [40:No] [41:No] [42:Yes] Clustered Wound: [40:N/A] [41:N/A] [42:2] Clustered Quantity: [40:4.3x5x0.1] [41:1.5x1.3x0.2] [42:2x3x0.1] Measurements L x W x D (cm) [40:16.886] [41:1.532] [42:4.712] A (cm) : rea [40:1.689] [41:0.306] [42:0.471] Volume (cm) : [40:21.70%] [41:-8.30%] [42:6.30%] % Reduction in Area: [40:21.70%] [41:-117.00%] [42:53.10%] % Reduction in Volume: [40:Full Thickness Without Exposed] [41:Full Thickness Without Exposed] [42:Full Thickness Without Exposed] Classification: [40:Support Structures Large] [41:Support Structures Small] [42:Support Structures Medium] Exudate A mount: [40:Serosanguineous] [41:Serosanguineous] [42:Serosanguineous] Exudate Type: [40:red, brown] [41:red, brown] [42:red, brown] Exudate Color: [40:Flat and Intact] [41:Flat and Intact] [42:Flat and Intact] Wound Margin:  [40:Medium (34-66%)] [41:Medium (34-66%)] [42:Medium (34-66%)] Granulation Amount: [40:Red, Friable] [41:Pink] [42:Pink, Friable] Granulation Quality: [40:Medium (34-66%)] [41:Medium (34-66%)] [42:Medium (34-66%)] Necrotic Amount: [40:Adherent Slough] [41:Adherent Slough] [42:Adherent Slough] Necrotic Tissue: [40:Fat Layer (Subcutaneous Tissue): Yes Fat Layer (Subcutaneous Tissue): Yes Fat Layer (Subcutaneous Tissue): Yes] Exposed Structures: [40:Fascia: No Tendon: No Muscle: No Joint: No Bone: No Small (1-33%)] [41:Fascia: No Tendon: No Muscle: No Joint: No Bone: No None] [42:Fascia: No Tendon: No Muscle: No Joint: No Bone: No Small (1-33%)] Wound Number: [id number] Photos: No Photos No Photos No Photos Right, Distal, Anterior Lower Leg Right, Proximal, Lateral Foot Right T Second oe Wound Location: Pressure Injury Pressure Injury Gradually Appeared Wounding Event: Pressure Ulcer Pressure Ulcer Pressure Ulcer Primary Etiology: Venous Leg Ulcer N/A N/A Secondary Etiology: Anemia, Deep Vein Thrombosis, Anemia, Deep Vein Thrombosis, Anemia, Deep Vein Thrombosis, Comorbid History: Colitis, Osteomyelitis, Neuropathy, Colitis, Osteomyelitis, Neuropathy, Colitis, Osteomyelitis, Neuropathy, Quadriplegia, Confinement Anxiety Quadriplegia, Confinement Anxiety Quadriplegia, Confinement Anxiety 02/21/2020 02/21/2020 03/31/2020 Date Acquired: Weeks of Treatment: Open Open Open Wound Status: Yes No No Clustered Wound: 2 N/A N/A Clustered Quantity: 2.9x0.9x0.1 5.8x2.5x0.4 0.3x0.5x0.1 Measurements L x W x D (cm) 2.05 11.388 0.118 A (cm) : rea 0.205 4.555 0.012 Volume (cm) : -49.20% -50.90% 80.50% % Reduction in A rea: 25.50% -101.20% 80.00% % Reduction in Volume: Category/Stage III Unstageable/Unclassified Category/Stage III Classification: Small Large Medium Exudate A mount: Serosanguineous Serosanguineous Serosanguineous Exudate Type: red, brown red, brown red, brown Exudate  Color: Flat and Intact Flat and Intact Flat and Intact Wound Margin: Small (1-33%) Small (1-33%) Small (1-33%) Granulation A mount: Red N/A Pink Granulation Quality: Large (67-100%) Large (67-100%) Large (67-100%) Necrotic A mount: Eschar, Adherent Slough Eschar, Adherent Slough Eschar, Adherent Slough Necrotic Tissue: Fat Layer (Subcutaneous Tissue): Yes Fat Layer (Subcutaneous Tissue): Yes Fat Layer (Subcutaneous Tissue): Yes Exposed Structures: Fascia: No Fascia: No Fascia: No Tendon: No Tendon: No Tendon: No Muscle: No Muscle: No Muscle: No Joint: No Joint: No Joint: No Bone: No Bone: No Bone: No Small (1-33%) None None Epithelialization: Treatment Notes Electronic Signature(s) Signed: 04/13/2020 5:14:03 PM By: Baltazar Najjar MD Signed: 04/13/2020 5:38:55 PM By: Shawn Stall Entered By: Baltazar Najjar on 04/13/2020 13:35:24 -------------------------------------------------------------------------------- Multi-Disciplinary Care Plan Details Patient Name: Date of Service: [name]. 04/13/2020 11:00 A M Medical Record Number: [id number] Patient Account Number: [id number] Date of Birth/Sex: Treating RN: 10-09-1960 (59 y.o. [name] Primary Care Dhaval Woo: [name] Other Clinician: Referring Gayle Martinez: Treating Riccardo Holeman/Extender: [name], [name] in Treatment: 3 Active Inactive Nutrition Nursing Diagnoses: Potential for alteratiion in Nutrition/Potential for imbalanced nutrition Goals: Patient/caregiver agrees to and verbalizes understanding of need to obtain nutritional consultation Date Initiated: 03/23/2020 Target Resolution Date: 05/12/2020 Goal Status: Active Interventions: Provide education on nutrition Treatment Activities:  EVANDER, MACARAEG ([id number]) Visit Report for 04/13/2020 Arrival Information Details Patient Name: Date of Service: [name], [name] 04/13/2020 11:00 A M Medical Record Number: [id number] Patient Account Number: [id number] Date of Birth/Sex: Treating RN: 10/11/1960 (59 y.o. Harlon Flor, Millard.Loa Primary Care Baldo Hufnagle: [name] Other Clinician: Referring Randie Bloodgood: Treating Zakariye Nee/Extender: [name], [name] in Treatment: 3 Visit Information History Since Last Visit Added or deleted any medications: No Patient Arrived: Wheel Chair Any new allergies or adverse reactions: No Arrival Time: 11:28 Had a fall or experienced change in No Accompanied By: caregiver activities of daily living that may affect Transfer Assistance: None risk of falls: Patient Identification Verified: Yes Signs or symptoms of abuse/neglect since last visito No Secondary Verification Process Completed: Yes Hospitalized since last visit: No Patient Requires Transmission-Based Precautions: No Implantable device outside of the clinic excluding No Patient Has Alerts: No cellular tissue based products placed in the center since last visit: Has Dressing in Place as Prescribed: Yes Has Compression in Place as Prescribed: Yes Pain Present Now: No Electronic Signature(s) Signed: 04/14/2020 12:02:47 PM By: [name] RN Entered By: [name] on 04/13/2020 11:30:30 -------------------------------------------------------------------------------- Clinic Level of Care Assessment Details Patient Name: Date of Service: [name], SKIRVIN 04/13/2020 11:00 A M Medical Record Number: [id number] Patient Account Number: [id number] Date of Birth/Sex: Treating RN: July 12, 1960 (59 y.o. [name] Primary Care Vastie Douty: [name] Other Clinician: Referring Seibert Keeter: Treating Nyja Westbrook/Extender: [name], [name] in Treatment: 3 Clinic Level of Care  Assessment Items TOOL 4 Quantity Score X- 1 0 Use when only an EandM is performed on FOLLOW-UP visit ASSESSMENTS - Nursing Assessment / Reassessment X- 1 10 Reassessment of Co-morbidities (includes updates in patient status) X- 1 5 Reassessment of Adherence to Treatment Plan ASSESSMENTS - Wound and Skin A ssessment / Reassessment []  - 0 Simple Wound Assessment / Reassessment - one wound X- 6 5 Complex Wound Assessment / Reassessment - multiple wounds X- 1 10 Dermatologic / Skin Assessment (not related to wound area) ASSESSMENTS - Focused Assessment X- 2 5 Circumferential Edema Measurements - multi extremities X- 1 10 Nutritional Assessment / Counseling / Intervention []  - 0 Lower Extremity Assessment (monofilament, tuning fork, pulses) []  - 0 Peripheral Arterial Disease Assessment (using hand held doppler) ASSESSMENTS - Ostomy and/or Continence Assessment and Care []  - 0 Incontinence Assessment and Management []  - 0 Ostomy Care Assessment and Management (repouching, etc.) PROCESS - Coordination of Care []  - 0 Simple Patient / Family Education for ongoing care X- 1 20 Complex (extensive) Patient / Family Education for ongoing care X- 1 10 Staff obtains Chiropractor, Records, T Results / Process Orders est X- 1 10 Staff telephones HHA, Nursing Homes / Clarify orders / etc []  - 0 Routine Transfer to another Facility (non-emergent condition) []  - 0 Routine Hospital Admission (non-emergent condition) []  - 0 New Admissions / Manufacturing engineer / Ordering NPWT Apligraf, etc. , []  - 0 Emergency Hospital Admission (emergent condition) []  - 0 Simple Discharge Coordination X- 1 15 Complex (extensive) Discharge Coordination PROCESS - Special Needs []  - 0 Pediatric / Minor Patient Management []  - 0 Isolation Patient Management []  - 0 Hearing / Language / Visual special needs []  - 0 Assessment of Community assistance (transportation, D/C planning, etc.) []  -  0 Additional assistance / Altered mentation []  - 0 Support Surface(s) Assessment (bed, cushion, seat, etc.) INTERVENTIONS - Wound Cleansing / Measurement []  - 0 Simple Wound Cleansing - one wound X- 6 5 Complex Wound  Structure Granulation Quality: Pink Fascia Exposed: No Necrotic Amount: Large (67-100%) Fat Layer (Subcutaneous Tissue) Exposed: Yes Necrotic Quality: Eschar, Adherent Slough Tendon Exposed: No Muscle Exposed: No Joint Exposed: No Bone Exposed: No Treatment Notes Wound #[id number] (Right Toe Second) 1. Cleanse With Wound Cleanser Soap and water 3. Primary Dressing Applied Iodoflex 4. Secondary Dressing Dry Gauze Roll Gauze 5. Secured With [employer]) Signed: 04/13/2020 5:38:55 PM By: Shawn Stall Signed: 04/14/2020 12:02:47 PM By: [name] RN Entered By: [name] on 04/13/2020 11:48:46 -------------------------------------------------------------------------------- Vitals Details Patient Name: Date of Service: Rosevear, Virgel Gess. 04/13/2020 11:00 A M Medical Record Number: [id number] Patient Account Number: [id number] Date of Birth/Sex: Treating RN: 1960-11-21 (59 y.o. [name] Primary Care Leonia Heatherly: [name] Other Clinician: Referring Simranjit Thayer: Treating Hilary Milks/Extender: [name], [name] in Treatment: 3 Vital Signs Time Taken: 11:27 Temperature (F): 97.7 Pulse (bpm): 81 Respiratory Rate (breaths/min): 18 Blood Pressure (mmHg): 160/95 Reference Range: 80 - 120 mg / dl Electronic Signature(s) Signed: 04/14/2020 12:02:47 PM By: [name] RN Entered By: [name]  on 04/13/2020 11:28:06  Neuropathy, Quadriplegia, Confinement Anxiety 02/21/2020] [41:Quadriplegia, Confinement Anxiety 02/21/2020] [42:Quadriplegia, Confinement  Anxiety 02/21/2020] Date Acquired: [40:3] [41:3] [42:3] Weeks of Treatment: [40:Open] [41:Open] [42:Open] Wound Status: [40:No] [41:No] [42:Yes] Clustered Wound: [40:N/A] [41:N/A] [42:2] Clustered Quantity: [40:4.3x5x0.1] [41:1.5x1.3x0.2] [42:2x3x0.1] Measurements L x W x D (cm) [40:16.886] [41:1.532] [42:4.712] A (cm) : rea [40:1.689] [41:0.306] [42:0.471] Volume (cm) : [40:21.70%] [41:-8.30%] [42:6.30%] % Reduction in Area: [40:21.70%] [41:-117.00%] [42:53.10%] % Reduction in Volume: [40:Full Thickness Without Exposed] [41:Full Thickness Without Exposed] [42:Full Thickness Without Exposed] Classification: [40:Support Structures Large] [41:Support Structures Small] [42:Support Structures Medium] Exudate A mount: [40:Serosanguineous] [41:Serosanguineous] [42:Serosanguineous] Exudate Type: [40:red, brown] [41:red, brown] [42:red, brown] Exudate Color: [40:Flat and Intact] [41:Flat and Intact] [42:Flat and Intact] Wound Margin:  [40:Medium (34-66%)] [41:Medium (34-66%)] [42:Medium (34-66%)] Granulation Amount: [40:Red, Friable] [41:Pink] [42:Pink, Friable] Granulation Quality: [40:Medium (34-66%)] [41:Medium (34-66%)] [42:Medium (34-66%)] Necrotic Amount: [40:Adherent Slough] [41:Adherent Slough] [42:Adherent Slough] Necrotic Tissue: [40:Fat Layer (Subcutaneous Tissue): Yes Fat Layer (Subcutaneous Tissue): Yes Fat Layer (Subcutaneous Tissue): Yes] Exposed Structures: [40:Fascia: No Tendon: No Muscle: No Joint: No Bone: No Small (1-33%)] [41:Fascia: No Tendon: No Muscle: No Joint: No Bone: No None] [42:Fascia: No Tendon: No Muscle: No Joint: No Bone: No Small (1-33%)] Wound Number: [id number] Photos: No Photos No Photos No Photos Right, Distal, Anterior Lower Leg Right, Proximal, Lateral Foot Right T Second oe Wound Location: Pressure Injury Pressure Injury Gradually Appeared Wounding Event: Pressure Ulcer Pressure Ulcer Pressure Ulcer Primary Etiology: Venous Leg Ulcer N/A N/A Secondary Etiology: Anemia, Deep Vein Thrombosis, Anemia, Deep Vein Thrombosis, Anemia, Deep Vein Thrombosis, Comorbid History: Colitis, Osteomyelitis, Neuropathy, Colitis, Osteomyelitis, Neuropathy, Colitis, Osteomyelitis, Neuropathy, Quadriplegia, Confinement Anxiety Quadriplegia, Confinement Anxiety Quadriplegia, Confinement Anxiety 02/21/2020 02/21/2020 03/31/2020 Date Acquired: Weeks of Treatment: Open Open Open Wound Status: Yes No No Clustered Wound: 2 N/A N/A Clustered Quantity: 2.9x0.9x0.1 5.8x2.5x0.4 0.3x0.5x0.1 Measurements L x W x D (cm) 2.05 11.388 0.118 A (cm) : rea 0.205 4.555 0.012 Volume (cm) : -49.20% -50.90% 80.50% % Reduction in A rea: 25.50% -101.20% 80.00% % Reduction in Volume: Category/Stage III Unstageable/Unclassified Category/Stage III Classification: Small Large Medium Exudate A mount: Serosanguineous Serosanguineous Serosanguineous Exudate Type: red, brown red, brown red, brown Exudate  Color: Flat and Intact Flat and Intact Flat and Intact Wound Margin: Small (1-33%) Small (1-33%) Small (1-33%) Granulation A mount: Red N/A Pink Granulation Quality: Large (67-100%) Large (67-100%) Large (67-100%) Necrotic A mount: Eschar, Adherent Slough Eschar, Adherent Slough Eschar, Adherent Slough Necrotic Tissue: Fat Layer (Subcutaneous Tissue): Yes Fat Layer (Subcutaneous Tissue): Yes Fat Layer (Subcutaneous Tissue): Yes Exposed Structures: Fascia: No Fascia: No Fascia: No Tendon: No Tendon: No Tendon: No Muscle: No Muscle: No Muscle: No Joint: No Joint: No Joint: No Bone: No Bone: No Bone: No Small (1-33%) None None Epithelialization: Treatment Notes Electronic Signature(s) Signed: 04/13/2020 5:14:03 PM By: Baltazar Najjar MD Signed: 04/13/2020 5:38:55 PM By: Shawn Stall Entered By: Baltazar Najjar on 04/13/2020 13:35:24 -------------------------------------------------------------------------------- Multi-Disciplinary Care Plan Details Patient Name: Date of Service: [name]. 04/13/2020 11:00 A M Medical Record Number: [id number] Patient Account Number: [id number] Date of Birth/Sex: Treating RN: 10-09-1960 (59 y.o. [name] Primary Care Dhaval Woo: [name] Other Clinician: Referring Gayle Martinez: Treating Riccardo Holeman/Extender: [name], [name] in Treatment: 3 Active Inactive Nutrition Nursing Diagnoses: Potential for alteratiion in Nutrition/Potential for imbalanced nutrition Goals: Patient/caregiver agrees to and verbalizes understanding of need to obtain nutritional consultation Date Initiated: 03/23/2020 Target Resolution Date: 05/12/2020 Goal Status: Active Interventions: Provide education on nutrition Treatment Activities:  A M Medical Record Number: [id number] Patient Account Number: [id number] Date of Birth/Sex: Treating RN: 21-Apr-1961 (59 y.o. Tammy SoursM) Deaton, Bobbi Primary Care Ezmae Speers: Jamison OkaBakare, Mobolaji Other Clinician: Referring Maite Burlison: Treating Maui Ahart/Extender: Alethia Bertholdobson, Michael Bakare, [name] in Treatment: 3 Wound Status Wound Number: [id number] Primary Lymphedema Etiology: Wound Location: Right T Fourth oe Wound Open Wounding Event: Trauma Status: Date Acquired: 02/21/2020 Comorbid Anemia, Deep Vein Thrombosis, Colitis, Osteomyelitis, Weeks Of Treatment: 3 History: Neuropathy, Quadriplegia, Confinement Anxiety Clustered Wound: No Wound Measurements Length: (cm) 1.5 Width: (cm) 1.3 Depth: (cm) 0.2 Area: (cm) 1.532 Volume: (cm) 0.306 % Reduction in Area: -8.3% % Reduction in Volume: -117% Epithelialization: None Tunneling: No Undermining: No Wound Description Classification: Full Thickness Without Exposed Support Structures Wound Margin: Flat and Intact Exudate Amount: Small Exudate Type: Serosanguineous Exudate Color: red, brown Foul Odor After Cleansing:  No Slough/Fibrino Yes Wound Bed Granulation Amount: Medium (34-66%) Exposed Structure Granulation Quality: Pink Fascia Exposed: No Necrotic Amount: Medium (34-66%) Fat Layer (Subcutaneous Tissue) Exposed: Yes Necrotic Quality: Adherent Slough Tendon Exposed: No Muscle Exposed: No Joint Exposed: No Bone Exposed: No Treatment Notes Wound #41 (Right Toe Fourth) 1. Cleanse With Wound Cleanser Soap and water 3. Primary Dressing Applied Iodoflex 4. Secondary Dressing Dry Gauze Roll Gauze 5. Secured With [employer] tape Electronic Signature(s) Signed: 04/13/2020 5:38:55 PM By: Shawn Stalleaton, Bobbi Signed: 04/14/2020 12:02:47 PM By: Fonnie MuBreedlove, [name] RN Entered By: Fonnie MuBreedlove, [name] on 04/13/2020 11:50:54 -------------------------------------------------------------------------------- Wound Assessment Details Patient Name: Date of Service: [name] RhineHUMBLE, Yeiren L. 04/13/2020 11:00 A M Medical Record Number: [id number] Patient Account Number: [id number] Date of Birth/Sex: Treating RN: 21-Apr-1961 (59 y.o. Tammy SoursM) Deaton, Bobbi Primary Care Soriya Worster: Jamison OkaBakare, Mobolaji Other Clinician: Referring Kana Reimann: Treating Huntington Leverich/Extender: Alethia Bertholdobson, Michael Bakare, [name] in Treatment: 3 Wound Status Wound Number: [id number] Primary Venous Leg Ulcer Etiology: Wound Location: Right, Medial Malleolus Wound Open Wounding Event: Gradually Appeared Status: Date Acquired: 02/21/2020 Comorbid Anemia, Deep Vein Thrombosis, Colitis, Osteomyelitis, Weeks Of Treatment: 3 History: Neuropathy, Quadriplegia, Confinement Anxiety Clustered Wound: Yes Wound Measurements Length: (cm) 2 Width: (cm) 3 Depth: (cm) 0.1 Clustered Quantity: 2 Area: (cm) 4.712 Volume: (cm) 0.471 % Reduction in Area: 6.3% % Reduction in Volume: 53.1% Epithelialization: Small (1-33%) Tunneling: No Undermining: No Wound Description Classification: Full Thickness Without Exposed Support Structures Wound Margin: Flat and Intact Exudate  Amount: Medium Exudate Type: Serosanguineous Exudate Color: red, brown Foul Odor After Cleansing: No Slough/Fibrino Yes Wound Bed Granulation Amount: Medium (34-66%) Exposed Structure Granulation Quality: Pink, Friable Fascia Exposed: No Necrotic Amount: Medium (34-66%) Fat Layer (Subcutaneous Tissue) Exposed: Yes Necrotic Quality: Adherent Slough Tendon Exposed: No Muscle Exposed: No Joint Exposed: No Bone Exposed: No Treatment Notes Wound #42 (Right, Medial Malleolus) 1. Cleanse With Wound Cleanser 2. Periwound Care Moisturizing lotion 3. Primary Dressing Applied Iodoflex 4. Secondary Dressing ABD Pad Dry Gauze 6. Support Layer Psychologist, educationalApplied Kerlix/Coban Electronic Signature(s) Signed: 04/13/2020 5:38:55 PM By: Shawn Stalleaton, Bobbi Signed: 04/14/2020 12:02:47 PM By: Fonnie MuBreedlove, [name] RN Entered By: Fonnie MuBreedlove, [name] on 04/13/2020 11:51:07 -------------------------------------------------------------------------------- Wound Assessment Details Patient Name: Date of Service: [name] RhineHUMBLE, Treyveon L. 04/13/2020 11:00 A M Medical Record Number: [id number] Patient Account Number: [id number] Date of Birth/Sex: Treating RN: 21-Apr-1961 (59 y.o. Tammy SoursM) Deaton, Bobbi Primary Care Joeline Freer: Jamison OkaBakare, Mobolaji Other Clinician: Referring Flannery Cavallero: Treating Whitaker Holderman/Extender: Alethia Bertholdobson, Michael Bakare, [name] in Treatment: 3 Wound Status Wound Number: [id number] Primary Pressure Ulcer Etiology: Wound Location: Right, Distal, Anterior Lower Leg Secondary Venous Leg Ulcer Wounding Event: Pressure Injury Etiology: Date Acquired: 02/21/2020 Wound Open Weeks Of Treatment: 3 Status: Clustered Wound:  Neuropathy, Quadriplegia, Confinement Anxiety 02/21/2020] [41:Quadriplegia, Confinement Anxiety 02/21/2020] [42:Quadriplegia, Confinement  Anxiety 02/21/2020] Date Acquired: [40:3] [41:3] [42:3] Weeks of Treatment: [40:Open] [41:Open] [42:Open] Wound Status: [40:No] [41:No] [42:Yes] Clustered Wound: [40:N/A] [41:N/A] [42:2] Clustered Quantity: [40:4.3x5x0.1] [41:1.5x1.3x0.2] [42:2x3x0.1] Measurements L x W x D (cm) [40:16.886] [41:1.532] [42:4.712] A (cm) : rea [40:1.689] [41:0.306] [42:0.471] Volume (cm) : [40:21.70%] [41:-8.30%] [42:6.30%] % Reduction in Area: [40:21.70%] [41:-117.00%] [42:53.10%] % Reduction in Volume: [40:Full Thickness Without Exposed] [41:Full Thickness Without Exposed] [42:Full Thickness Without Exposed] Classification: [40:Support Structures Large] [41:Support Structures Small] [42:Support Structures Medium] Exudate A mount: [40:Serosanguineous] [41:Serosanguineous] [42:Serosanguineous] Exudate Type: [40:red, brown] [41:red, brown] [42:red, brown] Exudate Color: [40:Flat and Intact] [41:Flat and Intact] [42:Flat and Intact] Wound Margin:  [40:Medium (34-66%)] [41:Medium (34-66%)] [42:Medium (34-66%)] Granulation Amount: [40:Red, Friable] [41:Pink] [42:Pink, Friable] Granulation Quality: [40:Medium (34-66%)] [41:Medium (34-66%)] [42:Medium (34-66%)] Necrotic Amount: [40:Adherent Slough] [41:Adherent Slough] [42:Adherent Slough] Necrotic Tissue: [40:Fat Layer (Subcutaneous Tissue): Yes Fat Layer (Subcutaneous Tissue): Yes Fat Layer (Subcutaneous Tissue): Yes] Exposed Structures: [40:Fascia: No Tendon: No Muscle: No Joint: No Bone: No Small (1-33%)] [41:Fascia: No Tendon: No Muscle: No Joint: No Bone: No None] [42:Fascia: No Tendon: No Muscle: No Joint: No Bone: No Small (1-33%)] Wound Number: [id number] Photos: No Photos No Photos No Photos Right, Distal, Anterior Lower Leg Right, Proximal, Lateral Foot Right T Second oe Wound Location: Pressure Injury Pressure Injury Gradually Appeared Wounding Event: Pressure Ulcer Pressure Ulcer Pressure Ulcer Primary Etiology: Venous Leg Ulcer N/A N/A Secondary Etiology: Anemia, Deep Vein Thrombosis, Anemia, Deep Vein Thrombosis, Anemia, Deep Vein Thrombosis, Comorbid History: Colitis, Osteomyelitis, Neuropathy, Colitis, Osteomyelitis, Neuropathy, Colitis, Osteomyelitis, Neuropathy, Quadriplegia, Confinement Anxiety Quadriplegia, Confinement Anxiety Quadriplegia, Confinement Anxiety 02/21/2020 02/21/2020 03/31/2020 Date Acquired: Weeks of Treatment: Open Open Open Wound Status: Yes No No Clustered Wound: 2 N/A N/A Clustered Quantity: 2.9x0.9x0.1 5.8x2.5x0.4 0.3x0.5x0.1 Measurements L x W x D (cm) 2.05 11.388 0.118 A (cm) : rea 0.205 4.555 0.012 Volume (cm) : -49.20% -50.90% 80.50% % Reduction in A rea: 25.50% -101.20% 80.00% % Reduction in Volume: Category/Stage III Unstageable/Unclassified Category/Stage III Classification: Small Large Medium Exudate A mount: Serosanguineous Serosanguineous Serosanguineous Exudate Type: red, brown red, brown red, brown Exudate  Color: Flat and Intact Flat and Intact Flat and Intact Wound Margin: Small (1-33%) Small (1-33%) Small (1-33%) Granulation A mount: Red N/A Pink Granulation Quality: Large (67-100%) Large (67-100%) Large (67-100%) Necrotic A mount: Eschar, Adherent Slough Eschar, Adherent Slough Eschar, Adherent Slough Necrotic Tissue: Fat Layer (Subcutaneous Tissue): Yes Fat Layer (Subcutaneous Tissue): Yes Fat Layer (Subcutaneous Tissue): Yes Exposed Structures: Fascia: No Fascia: No Fascia: No Tendon: No Tendon: No Tendon: No Muscle: No Muscle: No Muscle: No Joint: No Joint: No Joint: No Bone: No Bone: No Bone: No Small (1-33%) None None Epithelialization: Treatment Notes Electronic Signature(s) Signed: 04/13/2020 5:14:03 PM By: Baltazar Najjar MD Signed: 04/13/2020 5:38:55 PM By: Shawn Stall Entered By: Baltazar Najjar on 04/13/2020 13:35:24 -------------------------------------------------------------------------------- Multi-Disciplinary Care Plan Details Patient Name: Date of Service: [name]. 04/13/2020 11:00 A M Medical Record Number: [id number] Patient Account Number: [id number] Date of Birth/Sex: Treating RN: 10-09-1960 (59 y.o. [name] Primary Care Dhaval Woo: [name] Other Clinician: Referring Gayle Martinez: Treating Riccardo Holeman/Extender: [name], [name] in Treatment: 3 Active Inactive Nutrition Nursing Diagnoses: Potential for alteratiion in Nutrition/Potential for imbalanced nutrition Goals: Patient/caregiver agrees to and verbalizes understanding of need to obtain nutritional consultation Date Initiated: 03/23/2020 Target Resolution Date: 05/12/2020 Goal Status: Active Interventions: Provide education on nutrition Treatment Activities:  Patient referred to Primary Care Physician for further nutritional evaluation : 03/23/2020 Notes: Wound/Skin Impairment Nursing Diagnoses: Knowledge deficit related to  ulceration/compromised skin integrity Goals: Patient/caregiver will verbalize understanding of skin care regimen Date Initiated: 03/23/2020 Target Resolution Date: 06/22/2020 Goal Status: Active Ulcer/skin breakdown will have a volume reduction of 30% by week 4 Date Initiated: 03/31/2020 Target Resolution Date: 04/21/2020 Goal Status: Active Interventions: Assess patient/caregiver ability to perform ulcer/skin care regimen upon admission and as needed Provide education on ulcer and skin care Treatment Activities: Skin care regimen initiated : 03/23/2020 Topical wound management initiated : 03/23/2020 Notes: Electronic Signature(s) Signed: 04/13/2020 5:38:55 PM By: Shawn Stall Entered By: Shawn Stall on 04/13/2020 12:22:42 -------------------------------------------------------------------------------- Pain Assessment Details Patient Name: Date of Service: IZAIYAH, KLEINMAN 04/13/2020 11:00 A M Medical Record Number: [id number] Patient Account Number: [id number] Date of Birth/Sex: Treating RN: [birth year] (59 y.o. [name] Primary Care Chelesa Weingartner: [name] Other Clinician: Referring Enoch Moffa: Treating Deontray Hunnicutt/Extender: [name], [name] in Treatment: 3 Active Problems Location of Pain Severity and Description of Pain Patient Has Paino No Site Locations Rate the pain. Current Pain Level: 0 Pain Management and Medication Current Pain Management: Electronic Signature(s) Signed: 04/13/2020 5:38:55 PM By: Shawn Stall Signed: 04/14/2020 12:02:47 PM By: [name] RN Entered By: [name] on 04/13/2020 11:28:31 -------------------------------------------------------------------------------- Patient/Caregiver Education Details Patient Name: Date of Service: [name] 11/18/2021andnbsp11:00 A M Medical Record Number: [id number] Patient Account Number: [id number] Date of Birth/Gender: Treating RN: [birth year] (59  y.o. [name] Primary Care Physician: [name] Other Clinician: Referring Physician: Treating Physician/Extender: Lamarr Lulas in Treatment: 3 Education Assessment Education Provided To: Patient Education Topics Provided Nutrition: Handouts: Nutrition Methods: Explain/Verbal Responses: Reinforcements needed Electronic Signature(s) Signed: 04/13/2020 5:38:55 PM By: Shawn Stall Entered By: Shawn Stall on 04/13/2020 12:22:56 -------------------------------------------------------------------------------- Wound Assessment Details Patient Name: Date of Service: LADAINIAN, THERIEN 04/13/2020 11:00 A M Medical Record Number: [id number] Patient Account Number: [id number] Date of Birth/Sex: Treating RN: 03-Mar-1961 (59 y.o. [name] Primary Care Tiffaney Heimann: [name] Other Clinician: Referring Herman Mell: Treating Adaiah Morken/Extender: [name], [name] in Treatment: 3 Wound Status Wound Number: 40 Primary Venous Leg Ulcer Etiology: Wound Location: Left, Medial Lower Leg Wound Open Wounding Event: Gradually Appeared Status: Date Acquired: 02/21/2020 Comorbid Anemia, Deep Vein Thrombosis, Colitis, Osteomyelitis, Weeks Of Treatment: 3 History: Neuropathy, Quadriplegia, Confinement Anxiety Clustered Wound: No Wound Measurements Length: (cm) 4.3 Width: (cm) 5 Depth: (cm) 0.1 Area: (cm) 16.886 Volume: (cm) 1.689 % Reduction in Area: 21.7% % Reduction in Volume: 21.7% Epithelialization: Small (1-33%) Tunneling: No Undermining: No Wound Description Classification: Full Thickness Without Exposed Support Structures Wound Margin: Flat and Intact Exudate Amount: Large Exudate Type: Serosanguineous Exudate Color: red, brown Foul Odor After Cleansing: No Slough/Fibrino Yes Wound Bed Granulation Amount: Medium (34-66%) Exposed Structure Granulation Quality: Red, Friable Fascia Exposed: No Necrotic  Amount: Medium (34-66%) Fat Layer (Subcutaneous Tissue) Exposed: Yes Necrotic Quality: Adherent Slough Tendon Exposed: No Muscle Exposed: No Joint Exposed: No Bone Exposed: No Treatment Notes Wound #40 (Left, Medial Lower Leg) 1. Cleanse With Wound Cleanser 2. Periwound Care Moisturizing lotion 3. Primary Dressing Applied Hydrofera Blue 4. Secondary Dressing ABD Pad Dry Gauze 6. Support Layer Applied Kerlix/Coban Notes kerlix and coban applied lightly. netting. Electronic Signature(s) Signed: 04/13/2020 5:38:55 PM By: Shawn Stall Signed: 04/14/2020 12:02:47 PM By: [name] RN Entered By: [name] on 04/13/2020 11:50:34 -------------------------------------------------------------------------------- Wound Assessment Details Patient Name: Date of Service: Kopischke, Jamyson L. 04/13/2020 11:00

## 2020-04-28 ENCOUNTER — Other Ambulatory Visit: Payer: Self-pay | Admitting: Internal Medicine

## 2020-04-28 ENCOUNTER — Encounter (HOSPITAL_BASED_OUTPATIENT_CLINIC_OR_DEPARTMENT_OTHER): Payer: Medicare Other | Attending: Internal Medicine | Admitting: Internal Medicine

## 2020-04-28 ENCOUNTER — Other Ambulatory Visit: Payer: Self-pay

## 2020-04-28 ENCOUNTER — Ambulatory Visit
Admission: RE | Admit: 2020-04-28 | Discharge: 2020-04-28 | Disposition: A | Discharge status: Home / Self Care | Payer: Medicare Other | Source: Ambulatory Visit | Attending: Internal Medicine | Admitting: Internal Medicine

## 2020-04-28 DIAGNOSIS — Z8616 Personal history of COVID-19: Secondary | ICD-10-CM | POA: Insufficient documentation

## 2020-04-28 DIAGNOSIS — L89616 Pressure-induced deep tissue damage of right heel: Secondary | ICD-10-CM | POA: Diagnosis not present

## 2020-04-28 DIAGNOSIS — L97312 Non-pressure chronic ulcer of right 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: Secondary | ICD-10-CM | POA: Insufficient documentation

## 2020-04-28 DIAGNOSIS — L97322 Non-pressure chronic ulcer of left 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: Secondary | ICD-10-CM | POA: Insufficient documentation

## 2020-04-28 DIAGNOSIS — L97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 layer exposed: Secondary | ICD-10-CM | POA: Diagnosis not present

## 2020-04-28 DIAGNOSIS — G809 Cerebral palsy, unspecified: Secondary | ICD-10-CM | POA: Insufficient documentation

## 2020-04-28 DIAGNOSIS — I890 Lymphedema, not elsewhere classified: Secondary | ICD-10-CM | POA: Diagnosis not present

## 2020-04-28 DIAGNOSIS — R532 Functional quadriplegia: Secondary | ICD-10-CM | POA: Insufficient documentation

## 2020-04-28 DIAGNOSIS — Y839 Surgical procedure, unspecified as the cause of abnormal reaction of the patient, or of later complication, without mention of misadventure at the time of the procedure: Secondary | ICD-10-CM | POA: Insufficient documentation

## 2020-04-28 DIAGNOSIS — T8131XA Disruption of external operation (surgical) wound, not elsewhere classified, initial encounter: Secondary | ICD-10-CM | POA: Insufficient documentation

## 2020-04-28 DIAGNOSIS — L97519 Non-pressure chronic ulcer of other part of right foot with unspecified severity: Secondary | ICD-10-CM

## 2020-04-28 NOTE — Progress Notes (Signed)
[name], [name] ([id number]) Visit Report for 04/28/2020 HPI Details Patient Name: Date of Service: [name], [name] 04/28/2020 9:45 A M Medical Record Number: [id number] Patient Account Number: [id number] Date of Birth/Sex: Treating RN: 10/07/1960 (59 y.o. [name] SchoonerM) Boehlein, Linda Primary Care Provider: Jamison [name], [name] Other Clinician: Referring Provider: Treating Provider/Extender: [name] [name], [name] Weeks in Treatment: 5 History of Present Illness HPI Description: 11/23/15; this is a patient I remember from a previous stay in this clinic in 2012. The patient says this was for a wound in this same area as currently although I have no information to verify that. Most of the information is provided by his caregiver from the group home where he resides. Apparently he has had a wound in this area for 8 months. He also has erythema around this area and has had multiple rounds of antibiotics apparently with some improvement but the erythema is persists. He apparently had a fracture in the ankle 2 months ago and was seen by Dr. Lajoyce [name] . He apparently had a splint applied and more recently a heel offloading boot. He had home health coming out to a week ago not clear what they are dressing this with but they apparently discharged him perhaps because of one of Dr. 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 He is not a diabetic. Looking through [place] [facility] he appears to have fairly active primary care. The area on the ankle is variably labeled as a pressure area and/or chronic venous insufficiency. He had a recent venous ultrasound on 5/25 that did not show a DVT in the left leg however this was not a reflux study. There was no superficial DVT either. Arterial studies on 10/15/14 showed a left ABI of 1.16 Center right of 1.12 waveforms were triphasic he does not have  significant arterial disease. He has had recent x-rays of the left foot and ankle. The left foot did not show any abnormality. Left ankle x-ray showed a spiral fracture of the left tibial diaphysis without evidence of osteomyelitis. 11/30/15 culture I did last week showed pseudomonas I changed him to oral ciprofloxacin. Erythema is a lot better. 12/07/15 area around the wound shows considerable improvement of the periwound erythema. 12/14/15; the patient has a improvement of the periwound erythema which in retrospect I think with cellulitis successfully treated. We have been using Iodoflex started last week 12/25/15 wound on the left medial malleolus and dorsal left foot. [name] applied READMISSION 02/27/16; the patient was admitted to [name] from 8/14 through 01/18/16 initially felt to have cellulitis of his left lower leg however he was noted to have significant persistent pain and swelling. A duplex ultrasound was ordered that was negative. [name] an x-ray was done that showed a spiral fracture of the left leg. With posterior displacement and angulation at the mid left tibia. Orthopedics saw the patient and splinted the leg. He was then sent to [name] skilled facility and only return to his group home last week. He has wounds on the left medial malleolus, left dorsal foot and a worrisome area on the plantar left heel which is not really open but may represent a hematoma or a DTI 03/06/16; areas of left anterior and medial ankle both look improved. There are 3 small wounds here. He has a area over the dorsal left first metatarsal head which is not open. In meantime his left  Non-pressure chronic ulcer of other part of right foot with fat layer exposed) Electronic Signature(s) Signed: 04/28/2020 12:44:20 PM By: [name] Najjar MD Signed: 04/28/2020 5:01:53 PM By: [name] Deed RN, BSN Entered By: [name] on 04/28/2020 11:42:17 Prescription 04/28/2020 -------------------------------------------------------------------------------- Madie Reno MD Patient  Name: Provider: 03-Oct-1960 [id number] Date of Birth: NPI#[name] Sex: DEA #: [id number] [phone] Phone #: License #: Eligha Bridegroom [facility] Patient Address: [address] [place], [place] [address] [address] Floor [place], [place] [address] [phone] Allergies adhesive tape Provider's Orders X-ray, foot right complete view - ICD10: L97.512 - worsening nonhealing ulcer right lateral foot Hand Signature: Date(s): Electronic Signature(s) Signed: 04/28/2020 12:44:20 PM By: [name] Najjar MD Signed: 04/28/2020 5:01:53 PM By: [name] Deed RN, BSN Entered By: [name] on 04/28/2020 11:42:19 -------------------------------------------------------------------------------- Problem List Details Patient Name: Date of Service: [name] 04/28/2020 9:45 A M Medical Record Number: [id number] Patient Account Number: [id number] Date of Birth/Sex: Treating RN: Oct 22, 1960 (59 y.o. [name] Primary Care Provider: [name] Other Clinician: Referring Provider: Treating Provider/Extender: [name] Berthold, [name] Weeks in Treatment: 5 Active Problems ICD-10 Encounter Code Description Active Date MDM Diagnosis I89.0 Lymphedema, not elsewhere classified 03/23/2020 No Yes T81.31XD Disruption of external operation (surgical) wound, not elsewhere classified, 03/23/2020 No Yes subsequent encounter S90.811D Abrasion, right foot, subsequent encounter 03/23/2020 No Yes L97.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 layer exposed 03/23/2020 No Yes L97.312 Non-pressure chronic ulcer of right 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 03/23/2020 No Yes L97.322 Non-pressure chronic ulcer of left 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 03/23/2020 No Yes Inactive Problems ICD-10 Code Description Active Date Inactive Date L03.115 Cellulitis of right lower limb 04/07/2020 04/07/2020 Resolved Problems Electronic  Signature(s) Signed: 04/28/2020 12:44:20 PM By: [name] Najjar MD Entered By: [name] on 04/28/2020 12:19:46 -------------------------------------------------------------------------------- Progress Note Details Patient Name: Date of Service: [name] Rhine 04/28/2020 9:45 A M Medical Record Number: [id number] Patient Account Number: [id number] Date of Birth/Sex: Treating RN: September 22, 1960 (59 y.o. [name] Primary Care Provider: [name] Other Clinician: Referring Provider: Treating Provider/Extender: [name] Berthold, [name] Weeks in Treatment: 5 Subjective History of Present Illness (HPI) 11/23/15; this is a patient I remember from a previous stay in this clinic in 2012. The patient says this was for a wound in this same area as currently although I have no information to verify that. Most of the information is provided by his caregiver from the group home where he resides. Apparently he has had a wound in this area for 8 months. He also has erythema around this area and has had multiple rounds of antibiotics apparently with some improvement but the erythema is persists. He apparently had a fracture in the ankle 2 months ago and was seen by Dr. Lajoyce Corners . He apparently had a splint applied and more recently a heel offloading boot. He had home health coming out to a week ago not clear what they are dressing this with but they apparently discharged him perhaps because of one of Dr. 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 He has a Nurse, adult for transferring at home he is nonambulatory and does not wear footwear. He is not a diabetic. Looking through [place] [facility] he appears to have fairly active primary care. The area on the ankle is variably labeled as a pressure area and/or chronic venous insufficiency. He had a recent venous ultrasound  Record Number: [id number] Patient Account Number: [id number] Date of Birth/Sex: Treating RN: [birth year] (59 y.o. 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 Bathing/ Shower/ Hygiene May shower with protection but do not get wound dressing(s) wet. Edema Control - Lymphedema / SCD / Other Bilateral Lower Extremities Elevate legs to the level of the heart or above for 30 minutes daily and/or when sitting, a frequency of: Off-Loading Heel suspension boot to: - heel protectors at all times Home Health No change in wound care orders this week; continue Home Health for wound care. May utilize formulary equivalent dressing for wound treatment orders unless otherwise specified. Other Home Health Orders/Instructions: - [facility] Wound #40 - Lower Leg Wound Laterality: Left, Medial Cleanser: Wound Cleanser (Home Health) 2 x Per Week/30 Days Discharge Instructions: Cleanse the wound with wound cleanser prior to applying a clean dressing using gauze sponges, not tissue or cotton balls. Peri-Wound Care: Sween Lotion (Moisturizing lotion) (Home Health) 2 x Per Week/30 Days Discharge Instructions: Apply moisturizing lotion with dressing changes Prim Dressing: Hydrofera Blue Classic Foam, 4x4 in (Home Health) 2 x Per Week/30 Days ary Discharge Instructions: Apply to wound bed as instructed Secondary Dressing: ABD Pad, 5x9 (Home Health) 2 x Per Week/30 Days Discharge Instructions: Apply over primary dressing as directed. Compression Wrap: Kerlix Roll 4.5x3.1 (in/yd) (Home Health) 2 x Per Week/30 Days Discharge Instructions: Apply Kerlix and Coban compression as directed. Compression Wrap: Coban Self-Adherent Wrap 4x5 (in/yd) (Home Health) 2 x Per Week/30 Days Discharge Instructions: Apply over Kerlix as directed. Wound #41 - T Fourth oe Wound Laterality: Right Cleanser: Wound Cleanser (Home Health) 2 x Per Week/30 Days Discharge Instructions: Cleanse the wound with wound cleanser prior to applying a clean dressing using gauze sponges, not tissue or cotton balls. Peri-Wound Care:  Sween Lotion (Moisturizing lotion) 2 x Per Week/30 Days Discharge Instructions: Apply moisturizing lotion with dressing changes Prim Dressing: IODOFLEX 0.9% Cadexomer Iodine Pad 4x6 cm (Home Health) 2 x Per Week/30 Days ary Discharge Instructions: Apply to wound bed as instructed Secondary Dressing: Woven Gauze Sponge, Non-Sterile 4x4 in (Home Health) 2 x Per Week/30 Days Discharge Instructions: Apply over primary dressing as directed. Secondary Dressing: ABD Pad, 5x9 (Home Health) 2 x Per Week/30 Days Discharge Instructions: Apply over primary dressing as directed. Compression Wrap: Kerlix Roll 4.5x3.1 (in/yd) (Home Health) 2 x Per Week/30 Days Discharge Instructions: Apply Kerlix and Coban compression as directed. Compression Wrap: Coban Self-Adherent Wrap 4x5 (in/yd) (Home Health) 2 x Per Week/30 Days Discharge Instructions: Apply over Kerlix as directed. Wound #42 - Malleolus Wound Laterality: Right, Medial Cleanser: Wound Cleanser (Home Health) 2 x Per Week/30 Days Discharge Instructions: Cleanse the wound with wound cleanser prior to applying a clean dressing using gauze sponges, not tissue or cotton balls. Peri-Wound Care: Sween Lotion (Moisturizing lotion) 2 x Per Week/30 Days Discharge Instructions: Apply moisturizing lotion with dressing changes Prim Dressing: IODOFLEX 0.9% Cadexomer Iodine Pad  Non-pressure chronic ulcer of other part of right foot with fat layer exposed) Electronic Signature(s) Signed: 04/28/2020 12:44:20 PM By: [name] Najjar MD Signed: 04/28/2020 5:01:53 PM By: [name] Deed RN, BSN Entered By: [name] on 04/28/2020 11:42:17 Prescription 04/28/2020 -------------------------------------------------------------------------------- Madie Reno MD Patient  Name: Provider: 03-Oct-1960 [id number] Date of Birth: NPI#[name] Sex: DEA #: [id number] [id number] Phone #: License #: Eligha Bridegroom [facility] Patient Address: [address] [place], [place] [address] [address] [place], [place] [address] [phone] Allergies adhesive tape Provider's Orders X-ray, foot right complete view - ICD10: L97.512 - worsening nonhealing ulcer right lateral foot Hand Signature: Date(s): Electronic Signature(s) Signed: 04/28/2020 12:44:20 PM By: [name] Najjar MD Signed: 04/28/2020 5:01:53 PM By: [name] Deed RN, BSN Entered By: [name] on 04/28/2020 11:42:19 -------------------------------------------------------------------------------- Problem List Details Patient Name: Date of Service: [name] 04/28/2020 9:45 A M Medical Record Number: [id number] Patient Account Number: [id number] Date of Birth/Sex: Treating RN: Oct 22, 1960 (59 y.o. [name] Primary Care Provider: [name] Other Clinician: Referring Provider: Treating Provider/Extender: [name] Berthold, [name] Weeks in Treatment: 5 Active Problems ICD-10 Encounter Code Description Active Date MDM Diagnosis I89.0 Lymphedema, not elsewhere classified 03/23/2020 No Yes T81.31XD Disruption of external operation (surgical) wound, not elsewhere classified, 03/23/2020 No Yes subsequent encounter S90.811D Abrasion, right foot, subsequent encounter 03/23/2020 No Yes L97.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 layer exposed 03/23/2020 No Yes L97.312 Non-pressure chronic ulcer of right 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 03/23/2020 No Yes L97.322 Non-pressure chronic ulcer of left 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 03/23/2020 No Yes Inactive Problems ICD-10 Code Description Active Date Inactive Date L03.115 Cellulitis of right lower limb 04/07/2020 04/07/2020 Resolved Problems Electronic  Signature(s) Signed: 04/28/2020 12:44:20 PM By: [name] Najjar MD Entered By: [name] on 04/28/2020 12:19:46 -------------------------------------------------------------------------------- Progress Note Details Patient Name: Date of Service: [name] Rhine 04/28/2020 9:45 A M Medical Record Number: [id number] Patient Account Number: [id number] Date of Birth/Sex: Treating RN: September 22, 1960 (59 y.o. [name] Primary Care Provider: [name] Other Clinician: Referring Provider: Treating Provider/Extender: [name] Berthold, [name] Weeks in Treatment: 5 Subjective History of Present Illness (HPI) 11/23/15; this is a patient I remember from a previous stay in this clinic in 2012. The patient says this was for a wound in this same area as currently although I have no information to verify that. Most of the information is provided by his caregiver from the group home where he resides. Apparently he has had a wound in this area for 8 months. He also has erythema around this area and has had multiple rounds of antibiotics apparently with some improvement but the erythema is persists. He apparently had a fracture in the ankle 2 months ago and was seen by Dr. Lajoyce Corners . He apparently had a splint applied and more recently a heel offloading boot. He had home health coming out to a week ago not clear what they are dressing this with but they apparently discharged him perhaps because of one of Dr. 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 He has a Nurse, adult for transferring at home he is nonambulatory and does not wear footwear. He is not a diabetic. Looking through [place] [facility] he appears to have fairly active primary care. The area on the ankle is variably labeled as a pressure area and/or chronic venous insufficiency. He had a recent venous ultrasound  Non-pressure chronic ulcer of other part of right foot with fat layer exposed) Electronic Signature(s) Signed: 04/28/2020 12:44:20 PM By: [name] Najjar MD Signed: 04/28/2020 5:01:53 PM By: [name] Deed RN, BSN Entered By: [name] on 04/28/2020 11:42:17 Prescription 04/28/2020 -------------------------------------------------------------------------------- Madie Reno MD Patient  Name: Provider: 03-Oct-1960 [id number] Date of Birth: NPI#[name] Sex: DEA #: [id number] [phone] Phone #: License #: Eligha Bridegroom [facility] Patient Address: [address] [place], [place] [address] [address] [place], [place] [address] [phone] Allergies adhesive tape Provider's Orders X-ray, foot right complete view - ICD10: L97.512 - worsening nonhealing ulcer right lateral foot Hand Signature: Date(s): Electronic Signature(s) Signed: 04/28/2020 12:44:20 PM By: [name] Najjar MD Signed: 04/28/2020 5:01:53 PM By: [name] Deed RN, BSN Entered By: [name] on 04/28/2020 11:42:19 -------------------------------------------------------------------------------- Problem List Details Patient Name: Date of Service: [name] 04/28/2020 9:45 A M Medical Record Number: [id number] Patient Account Number: [id number] Date of Birth/Sex: Treating RN: Oct 22, 1960 (59 y.o. [name] Primary Care Provider: [name] Other Clinician: Referring Provider: Treating Provider/Extender: [name] Berthold, [name] Weeks in Treatment: 5 Active Problems ICD-10 Encounter Code Description Active Date MDM Diagnosis I89.0 Lymphedema, not elsewhere classified 03/23/2020 No Yes T81.31XD Disruption of external operation (surgical) wound, not elsewhere classified, 03/23/2020 No Yes subsequent encounter S90.811D Abrasion, right foot, subsequent encounter 03/23/2020 No Yes L97.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 layer exposed 03/23/2020 No Yes L97.312 Non-pressure chronic ulcer of right 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 03/23/2020 No Yes L97.322 Non-pressure chronic ulcer of left 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 03/23/2020 No Yes Inactive Problems ICD-10 Code Description Active Date Inactive Date L03.115 Cellulitis of right lower limb 04/07/2020 04/07/2020 Resolved Problems Electronic  Signature(s) Signed: 04/28/2020 12:44:20 PM By: [name] Najjar MD Entered By: [name] on 04/28/2020 12:19:46 -------------------------------------------------------------------------------- Progress Note Details Patient Name: Date of Service: [name] Rhine 04/28/2020 9:45 A M Medical Record Number: [id number] Patient Account Number: [id number] Date of Birth/Sex: Treating RN: September 22, 1960 (59 y.o. [name] Primary Care Provider: [name] Other Clinician: Referring Provider: Treating Provider/Extender: [name] Berthold, [name] Weeks in Treatment: 5 Subjective History of Present Illness (HPI) 11/23/15; this is a patient I remember from a previous stay in this clinic in 2012. The patient says this was for a wound in this same area as currently although I have no information to verify that. Most of the information is provided by his caregiver from the group home where he resides. Apparently he has had a wound in this area for 8 months. He also has erythema around this area and has had multiple rounds of antibiotics apparently with some improvement but the erythema is persists. He apparently had a fracture in the ankle 2 months ago and was seen by Dr. Lajoyce Corners . He apparently had a splint applied and more recently a heel offloading boot. He had home health coming out to a week ago not clear what they are dressing this with but they apparently discharged him perhaps because of one of Dr. 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 He has a Nurse, adult for transferring at home he is nonambulatory and does not wear footwear. He is not a diabetic. Looking through [place] [facility] he appears to have fairly active primary care. The area on the ankle is variably labeled as a pressure area and/or chronic venous insufficiency. He had a recent venous ultrasound  Record Number: [id number] Patient Account Number: [id number] Date of Birth/Sex: Treating RN: [birth year] (59 y.o. 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 Bathing/ Shower/ Hygiene May shower with protection but do not get wound dressing(s) wet. Edema Control - Lymphedema / SCD / Other Bilateral Lower Extremities Elevate legs to the level of the heart or above for 30 minutes daily and/or when sitting, a frequency of: Off-Loading Heel suspension boot to: - heel protectors at all times Home Health No change in wound care orders this week; continue Home Health for wound care. May utilize formulary equivalent dressing for wound treatment orders unless otherwise specified. Other Home Health Orders/Instructions: - [facility] Wound Treatment Wound #40 - Lower Leg Wound Laterality: Left, Medial Cleanser: Wound Cleanser (Home Health) 2 x Per Week/30 Days Discharge Instructions: Cleanse the wound with wound cleanser prior to applying a clean dressing using gauze sponges, not tissue or cotton balls. Peri-Wound Care: Sween Lotion (Moisturizing lotion) (Home Health) 2 x Per Week/30 Days Discharge Instructions: Apply moisturizing lotion with dressing changes Prim Dressing: Hydrofera Blue Classic Foam, 4x4 in (Home Health) 2 x Per Week/30 Days ary Discharge Instructions: Apply to wound bed as instructed Secondary Dressing: ABD Pad, 5x9 (Home Health) 2 x Per Week/30 Days Discharge Instructions: Apply over primary dressing as directed. Compression Wrap: Kerlix Roll 4.5x3.1 (in/yd) (Home Health) 2 x Per Week/30 Days Discharge Instructions: Apply Kerlix and Coban compression as directed. Compression Wrap: Coban Self-Adherent Wrap 4x5 (in/yd) (Home Health) 2 x Per Week/30 Days Discharge Instructions: Apply over Kerlix as directed. Wound #41 - T Fourth oe Wound Laterality: Right Cleanser: Wound Cleanser (Home Health) 2 x Per Week/30 Days Discharge Instructions: Cleanse the wound with wound cleanser prior to applying a clean dressing using gauze sponges, not tissue or cotton balls. Peri-Wound Care:  Sween Lotion (Moisturizing lotion) 2 x Per Week/30 Days Discharge Instructions: Apply moisturizing lotion with dressing changes Prim Dressing: IODOFLEX 0.9% Cadexomer Iodine Pad 4x6 cm (Home Health) 2 x Per Week/30 Days ary Discharge Instructions: Apply to wound bed as instructed Secondary Dressing: Woven Gauze Sponge, Non-Sterile 4x4 in (Home Health) 2 x Per Week/30 Days Discharge Instructions: Apply over primary dressing as directed. Secondary Dressing: ABD Pad, 5x9 (Home Health) 2 x Per Week/30 Days Discharge Instructions: Apply over primary dressing as directed. Compression Wrap: Kerlix Roll 4.5x3.1 (in/yd) (Home Health) 2 x Per Week/30 Days Discharge Instructions: Apply Kerlix and Coban compression as directed. Compression Wrap: Coban Self-Adherent Wrap 4x5 (in/yd) (Home Health) 2 x Per Week/30 Days Discharge Instructions: Apply over Kerlix as directed. Wound #42 - Malleolus Wound Laterality: Right, Medial Cleanser: Wound Cleanser (Home Health) 2 x Per Week/30 Days Discharge Instructions: Cleanse the wound with wound cleanser prior to applying a clean dressing using gauze sponges, not tissue or cotton balls. Peri-Wound Care: Sween Lotion (Moisturizing lotion) 2 x Per Week/30 Days Discharge Instructions: Apply moisturizing lotion with dressing changes Prim Dressing: IODOFLEX 0.9% Cadexomer Iodine Pad  [name], [name] ([id number]) Visit Report for 04/28/2020 HPI Details Patient Name: Date of Service: [name], [name] 04/28/2020 9:45 A M Medical Record Number: [id number] Patient Account Number: [id number] Date of Birth/Sex: Treating RN: 10/07/1960 (59 y.o. [name] SchoonerM) Boehlein, Linda Primary Care Provider: Jamison [name], [name] Other Clinician: Referring Provider: Treating Provider/Extender: [name] [name], [name] Weeks in Treatment: 5 History of Present Illness HPI Description: 11/23/15; this is a patient I remember from a previous stay in this clinic in 2012. The patient says this was for a wound in this same area as currently although I have no information to verify that. Most of the information is provided by his caregiver from the group home where he resides. Apparently he has had a wound in this area for 8 months. He also has erythema around this area and has had multiple rounds of antibiotics apparently with some improvement but the erythema is persists. He apparently had a fracture in the ankle 2 months ago and was seen by Dr. Lajoyce [name] . He apparently had a splint applied and more recently a heel offloading boot. He had home health coming out to a week ago not clear what they are dressing this with but they apparently discharged him perhaps because of one of Dr. 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 He is not a diabetic. Looking through [place] [facility] he appears to have fairly active primary care. The area on the ankle is variably labeled as a pressure area and/or chronic venous insufficiency. He had a recent venous ultrasound on 5/25 that did not show a DVT in the left leg however this was not a reflux study. There was no superficial DVT either. Arterial studies on 10/15/14 showed a left ABI of 1.16 Center right of 1.12 waveforms were triphasic he does not have  significant arterial disease. He has had recent x-rays of the left foot and ankle. The left foot did not show any abnormality. Left ankle x-ray showed a spiral fracture of the left tibial diaphysis without evidence of osteomyelitis. 11/30/15 culture I did last week showed pseudomonas I changed him to oral ciprofloxacin. Erythema is a lot better. 12/07/15 area around the wound shows considerable improvement of the periwound erythema. 12/14/15; the patient has a improvement of the periwound erythema which in retrospect I think with cellulitis successfully treated. We have been using Iodoflex started last week 12/25/15 wound on the left medial malleolus and dorsal left foot. [name] applied READMISSION 02/27/16; the patient was admitted to [name] from 8/14 through 01/18/16 initially felt to have cellulitis of his left lower leg however he was noted to have significant persistent pain and swelling. A duplex ultrasound was ordered that was negative. [name] an x-ray was done that showed a spiral fracture of the left leg. With posterior displacement and angulation at the mid left tibia. Orthopedics saw the patient and splinted the leg. He was then sent to [name] skilled facility and only return to his group home last week. He has wounds on the left medial malleolus, left dorsal foot and a worrisome area on the plantar left heel which is not really open but may represent a hematoma or a DTI 03/06/16; areas of left anterior and medial ankle both look improved. There are 3 small wounds here. He has a area over the dorsal left first metatarsal head which is not open. In meantime his left  Non-pressure chronic ulcer of other part of right foot with fat layer exposed) Electronic Signature(s) Signed: 04/28/2020 12:44:20 PM By: [name] Najjar MD Signed: 04/28/2020 5:01:53 PM By: [name] Deed RN, BSN Entered By: [name] on 04/28/2020 11:42:17 Prescription 04/28/2020 -------------------------------------------------------------------------------- Madie Reno MD Patient  Name: Provider: 03-Oct-1960 [id number] Date of Birth: NPI#[name] Sex: DEA #: [id number] [id number] Phone #: License #: Eligha Bridegroom [facility] Patient Address: [address] [place], [place] [address] [address] [place], [place] [address] [phone] Allergies adhesive tape Provider's Orders X-ray, foot right complete view - ICD10: L97.512 - worsening nonhealing ulcer right lateral foot Hand Signature: Date(s): Electronic Signature(s) Signed: 04/28/2020 12:44:20 PM By: [name] Najjar MD Signed: 04/28/2020 5:01:53 PM By: [name] Deed RN, BSN Entered By: [name] on 04/28/2020 11:42:19 -------------------------------------------------------------------------------- Problem List Details Patient Name: Date of Service: [name] 04/28/2020 9:45 A M Medical Record Number: [id number] Patient Account Number: [id number] Date of Birth/Sex: Treating RN: Oct 22, 1960 (59 y.o. [name] Primary Care Provider: [name] Other Clinician: Referring Provider: Treating Provider/Extender: [name] Berthold, [name] Weeks in Treatment: 5 Active Problems ICD-10 Encounter Code Description Active Date MDM Diagnosis I89.0 Lymphedema, not elsewhere classified 03/23/2020 No Yes T81.31XD Disruption of external operation (surgical) wound, not elsewhere classified, 03/23/2020 No Yes subsequent encounter S90.811D Abrasion, right foot, subsequent encounter 03/23/2020 No Yes L97.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 layer exposed 03/23/2020 No Yes L97.312 Non-pressure chronic ulcer of right 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 03/23/2020 No Yes L97.322 Non-pressure chronic ulcer of left 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 03/23/2020 No Yes Inactive Problems ICD-10 Code Description Active Date Inactive Date L03.115 Cellulitis of right lower limb 04/07/2020 04/07/2020 Resolved Problems Electronic  Signature(s) Signed: 04/28/2020 12:44:20 PM By: [name] Najjar MD Entered By: [name] on 04/28/2020 12:19:46 -------------------------------------------------------------------------------- Progress Note Details Patient Name: Date of Service: [name] Rhine 04/28/2020 9:45 A M Medical Record Number: [id number] Patient Account Number: [id number] Date of Birth/Sex: Treating RN: September 22, 1960 (59 y.o. [name] Primary Care Provider: [name] Other Clinician: Referring Provider: Treating Provider/Extender: [name] Berthold, [name] Weeks in Treatment: 5 Subjective History of Present Illness (HPI) 11/23/15; this is a patient I remember from a previous stay in this clinic in 2012. The patient says this was for a wound in this same area as currently although I have no information to verify that. Most of the information is provided by his caregiver from the group home where he resides. Apparently he has had a wound in this area for 8 months. He also has erythema around this area and has had multiple rounds of antibiotics apparently with some improvement but the erythema is persists. He apparently had a fracture in the ankle 2 months ago and was seen by Dr. Lajoyce Corners . He apparently had a splint applied and more recently a heel offloading boot. He had home health coming out to a week ago not clear what they are dressing this with but they apparently discharged him perhaps because of one of Dr. 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 He has a Nurse, adult for transferring at home he is nonambulatory and does not wear footwear. He is not a diabetic. Looking through [place] [facility] he appears to have fairly active primary care. The area on the ankle is variably labeled as a pressure area and/or chronic venous insufficiency. He had a recent venous ultrasound  Non-pressure chronic ulcer of other part of right foot with fat layer exposed) Electronic Signature(s) Signed: 04/28/2020 12:44:20 PM By: [name] Najjar MD Signed: 04/28/2020 5:01:53 PM By: [name] Deed RN, BSN Entered By: [name] on 04/28/2020 11:42:17 Prescription 04/28/2020 -------------------------------------------------------------------------------- Madie Reno MD Patient  Name: Provider: 03-Oct-1960 [id number] Date of Birth: NPI#[name] Sex: DEA #: [id number] [id number] Phone #: License #: Eligha Bridegroom [facility] Patient Address: [address] [place], [place] [address] [address] [place], [place] [address] [phone] Allergies adhesive tape Provider's Orders X-ray, foot right complete view - ICD10: L97.512 - worsening nonhealing ulcer right lateral foot Hand Signature: Date(s): Electronic Signature(s) Signed: 04/28/2020 12:44:20 PM By: [name] Najjar MD Signed: 04/28/2020 5:01:53 PM By: [name] Deed RN, BSN Entered By: [name] on 04/28/2020 11:42:19 -------------------------------------------------------------------------------- Problem List Details Patient Name: Date of Service: [name] 04/28/2020 9:45 A M Medical Record Number: [id number] Patient Account Number: [id number] Date of Birth/Sex: Treating RN: Oct 22, 1960 (59 y.o. [name] Primary Care Provider: [name] Other Clinician: Referring Provider: Treating Provider/Extender: [name] Berthold, [name] Weeks in Treatment: 5 Active Problems ICD-10 Encounter Code Description Active Date MDM Diagnosis I89.0 Lymphedema, not elsewhere classified 03/23/2020 No Yes T81.31XD Disruption of external operation (surgical) wound, not elsewhere classified, 03/23/2020 No Yes subsequent encounter S90.811D Abrasion, right foot, subsequent encounter 03/23/2020 No Yes L97.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 layer exposed 03/23/2020 No Yes L97.312 Non-pressure chronic ulcer of right 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 03/23/2020 No Yes L97.322 Non-pressure chronic ulcer of left 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 03/23/2020 No Yes Inactive Problems ICD-10 Code Description Active Date Inactive Date L03.115 Cellulitis of right lower limb 04/07/2020 04/07/2020 Resolved Problems Electronic  Signature(s) Signed: 04/28/2020 12:44:20 PM By: [name] Najjar MD Entered By: [name] on 04/28/2020 12:19:46 -------------------------------------------------------------------------------- Progress Note Details Patient Name: Date of Service: [name] Rhine 04/28/2020 9:45 A M Medical Record Number: [id number] Patient Account Number: [id number] Date of Birth/Sex: Treating RN: September 22, 1960 (59 y.o. [name] Primary Care Provider: [name] Other Clinician: Referring Provider: Treating Provider/Extender: [name] Berthold, [name] Weeks in Treatment: 5 Subjective History of Present Illness (HPI) 11/23/15; this is a patient I remember from a previous stay in this clinic in 2012. The patient says this was for a wound in this same area as currently although I have no information to verify that. Most of the information is provided by his caregiver from the group home where he resides. Apparently he has had a wound in this area for 8 months. He also has erythema around this area and has had multiple rounds of antibiotics apparently with some improvement but the erythema is persists. He apparently had a fracture in the ankle 2 months ago and was seen by Dr. Lajoyce Corners . He apparently had a splint applied and more recently a heel offloading boot. He had home health coming out to a week ago not clear what they are dressing this with but they apparently discharged him perhaps because of one of Dr. 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 He has a Nurse, adult for transferring at home he is nonambulatory and does not wear footwear. He is not a diabetic. Looking through [place] [facility] he appears to have fairly active primary care. The area on the ankle is variably labeled as a pressure area and/or chronic venous insufficiency. He had a recent venous ultrasound  [name], [name] ([id number]) Visit Report for 04/28/2020 HPI Details Patient Name: Date of Service: [name], [name] 04/28/2020 9:45 A M Medical Record Number: [id number] Patient Account Number: [id number] Date of Birth/Sex: Treating RN: 10/07/1960 (59 y.o. [name] SchoonerM) Boehlein, Linda Primary Care Provider: Jamison [name], [name] Other Clinician: Referring Provider: Treating Provider/Extender: [name] [name], [name] Weeks in Treatment: 5 History of Present Illness HPI Description: 11/23/15; this is a patient I remember from a previous stay in this clinic in 2012. The patient says this was for a wound in this same area as currently although I have no information to verify that. Most of the information is provided by his caregiver from the group home where he resides. Apparently he has had a wound in this area for 8 months. He also has erythema around this area and has had multiple rounds of antibiotics apparently with some improvement but the erythema is persists. He apparently had a fracture in the ankle 2 months ago and was seen by Dr. Lajoyce [name] . He apparently had a splint applied and more recently a heel offloading boot. He had home health coming out to a week ago not clear what they are dressing this with but they apparently discharged him perhaps because of one of Dr. 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 He is not a diabetic. Looking through [place] [facility] he appears to have fairly active primary care. The area on the ankle is variably labeled as a pressure area and/or chronic venous insufficiency. He had a recent venous ultrasound on 5/25 that did not show a DVT in the left leg however this was not a reflux study. There was no superficial DVT either. Arterial studies on 10/15/14 showed a left ABI of 1.16 Center right of 1.12 waveforms were triphasic he does not have  significant arterial disease. He has had recent x-rays of the left foot and ankle. The left foot did not show any abnormality. Left ankle x-ray showed a spiral fracture of the left tibial diaphysis without evidence of osteomyelitis. 11/30/15 culture I did last week showed pseudomonas I changed him to oral ciprofloxacin. Erythema is a lot better. 12/07/15 area around the wound shows considerable improvement of the periwound erythema. 12/14/15; the patient has a improvement of the periwound erythema which in retrospect I think with cellulitis successfully treated. We have been using Iodoflex started last week 12/25/15 wound on the left medial malleolus and dorsal left foot. [name] applied READMISSION 02/27/16; the patient was admitted to [name] from 8/14 through 01/18/16 initially felt to have cellulitis of his left lower leg however he was noted to have significant persistent pain and swelling. A duplex ultrasound was ordered that was negative. [name] an x-ray was done that showed a spiral fracture of the left leg. With posterior displacement and angulation at the mid left tibia. Orthopedics saw the patient and splinted the leg. He was then sent to [name] skilled facility and only return to his group home last week. He has wounds on the left medial malleolus, left dorsal foot and a worrisome area on the plantar left heel which is not really open but may represent a hematoma or a DTI 03/06/16; areas of left anterior and medial ankle both look improved. There are 3 small wounds here. He has a area over the dorsal left first metatarsal head which is not open. In meantime his left  [name], [name] ([id number]) Visit Report for 04/28/2020 HPI Details Patient Name: Date of Service: [name], [name] 04/28/2020 9:45 A M Medical Record Number: [id number] Patient Account Number: [id number] Date of Birth/Sex: Treating RN: 10/07/1960 (59 y.o. [name] SchoonerM) Boehlein, Linda Primary Care Provider: Jamison [name], [name] Other Clinician: Referring Provider: Treating Provider/Extender: [name] [name], [name] Weeks in Treatment: 5 History of Present Illness HPI Description: 11/23/15; this is a patient I remember from a previous stay in this clinic in 2012. The patient says this was for a wound in this same area as currently although I have no information to verify that. Most of the information is provided by his caregiver from the group home where he resides. Apparently he has had a wound in this area for 8 months. He also has erythema around this area and has had multiple rounds of antibiotics apparently with some improvement but the erythema is persists. He apparently had a fracture in the ankle 2 months ago and was seen by Dr. Lajoyce [name] . He apparently had a splint applied and more recently a heel offloading boot. He had home health coming out to a week ago not clear what they are dressing this with but they apparently discharged him perhaps because of one of Dr. 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 He is not a diabetic. Looking through [place] [facility] he appears to have fairly active primary care. The area on the ankle is variably labeled as a pressure area and/or chronic venous insufficiency. He had a recent venous ultrasound on 5/25 that did not show a DVT in the left leg however this was not a reflux study. There was no superficial DVT either. Arterial studies on 10/15/14 showed a left ABI of 1.16 Center right of 1.12 waveforms were triphasic he does not have  significant arterial disease. He has had recent x-rays of the left foot and ankle. The left foot did not show any abnormality. Left ankle x-ray showed a spiral fracture of the left tibial diaphysis without evidence of osteomyelitis. 11/30/15 culture I did last week showed pseudomonas I changed him to oral ciprofloxacin. Erythema is a lot better. 12/07/15 area around the wound shows considerable improvement of the periwound erythema. 12/14/15; the patient has a improvement of the periwound erythema which in retrospect I think with cellulitis successfully treated. We have been using Iodoflex started last week 12/25/15 wound on the left medial malleolus and dorsal left foot. [name] applied READMISSION 02/27/16; the patient was admitted to [name] from 8/14 through 01/18/16 initially felt to have cellulitis of his left lower leg however he was noted to have significant persistent pain and swelling. A duplex ultrasound was ordered that was negative. [name] an x-ray was done that showed a spiral fracture of the left leg. With posterior displacement and angulation at the mid left tibia. Orthopedics saw the patient and splinted the leg. He was then sent to [name] skilled facility and only return to his group home last week. He has wounds on the left medial malleolus, left dorsal foot and a worrisome area on the plantar left heel which is not really open but may represent a hematoma or a DTI 03/06/16; areas of left anterior and medial ankle both look improved. There are 3 small wounds here. He has a area over the dorsal left first metatarsal head which is not open. In meantime his left  [name], [name] ([id number]) Visit Report for 04/28/2020 HPI Details Patient Name: Date of Service: [name], [name] 04/28/2020 9:45 A M Medical Record Number: [id number] Patient Account Number: [id number] Date of Birth/Sex: Treating RN: 10/07/1960 (59 y.o. [name] SchoonerM) Boehlein, Linda Primary Care Provider: Jamison [name], [name] Other Clinician: Referring Provider: Treating Provider/Extender: [name] [name], [name] Weeks in Treatment: 5 History of Present Illness HPI Description: 11/23/15; this is a patient I remember from a previous stay in this clinic in 2012. The patient says this was for a wound in this same area as currently although I have no information to verify that. Most of the information is provided by his caregiver from the group home where he resides. Apparently he has had a wound in this area for 8 months. He also has erythema around this area and has had multiple rounds of antibiotics apparently with some improvement but the erythema is persists. He apparently had a fracture in the ankle 2 months ago and was seen by Dr. Lajoyce [name] . He apparently had a splint applied and more recently a heel offloading boot. He had home health coming out to a week ago not clear what they are dressing this with but they apparently discharged him perhaps because of one of Dr. 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 He is not a diabetic. Looking through [place] [facility] he appears to have fairly active primary care. The area on the ankle is variably labeled as a pressure area and/or chronic venous insufficiency. He had a recent venous ultrasound on 5/25 that did not show a DVT in the left leg however this was not a reflux study. There was no superficial DVT either. Arterial studies on 10/15/14 showed a left ABI of 1.16 Center right of 1.12 waveforms were triphasic he does not have  significant arterial disease. He has had recent x-rays of the left foot and ankle. The left foot did not show any abnormality. Left ankle x-ray showed a spiral fracture of the left tibial diaphysis without evidence of osteomyelitis. 11/30/15 culture I did last week showed pseudomonas I changed him to oral ciprofloxacin. Erythema is a lot better. 12/07/15 area around the wound shows considerable improvement of the periwound erythema. 12/14/15; the patient has a improvement of the periwound erythema which in retrospect I think with cellulitis successfully treated. We have been using Iodoflex started last week 12/25/15 wound on the left medial malleolus and dorsal left foot. [name] applied READMISSION 02/27/16; the patient was admitted to [name] from 8/14 through 01/18/16 initially felt to have cellulitis of his left lower leg however he was noted to have significant persistent pain and swelling. A duplex ultrasound was ordered that was negative. [name] an x-ray was done that showed a spiral fracture of the left leg. With posterior displacement and angulation at the mid left tibia. Orthopedics saw the patient and splinted the leg. He was then sent to [name] skilled facility and only return to his group home last week. He has wounds on the left medial malleolus, left dorsal foot and a worrisome area on the plantar left heel which is not really open but may represent a hematoma or a DTI 03/06/16; areas of left anterior and medial ankle both look improved. There are 3 small wounds here. He has a area over the dorsal left first metatarsal head which is not open. In meantime his left  Non-pressure chronic ulcer of other part of right foot with fat layer exposed) Electronic Signature(s) Signed: 04/28/2020 12:44:20 PM By: [name] Najjar MD Signed: 04/28/2020 5:01:53 PM By: [name] Deed RN, BSN Entered By: [name] on 04/28/2020 11:42:17 Prescription 04/28/2020 -------------------------------------------------------------------------------- Madie Reno MD Patient  Name: Provider: 03-Oct-1960 [id number] Date of Birth: NPI#[name] Sex: DEA #: [phone] Phone #: License #: Eligha Bridegroom [facility] Patient Address: [address] [place], [place] [address] [address] [place], [place] [address] [phone] Allergies adhesive tape Provider's Orders X-ray, foot right complete view - ICD10: L97.512 - worsening nonhealing ulcer right lateral foot Hand Signature: Date(s): Electronic Signature(s) Signed: 04/28/2020 12:44:20 PM By: [name] Najjar MD Signed: 04/28/2020 5:01:53 PM By: [name] Deed RN, BSN Entered By: [name] on 04/28/2020 11:42:19 -------------------------------------------------------------------------------- Problem List Details Patient Name: Date of Service: [name] 04/28/2020 9:45 A M Medical Record Number: [id number] Patient Account Number: [id number] Date of Birth/Sex: Treating RN: Oct 22, 1960 (59 y.o. [name] Primary Care Provider: [name] Other Clinician: Referring Provider: Treating Provider/Extender: [name] Berthold, [name] Weeks in Treatment: 5 Active Problems ICD-10 Encounter Code Description Active Date MDM Diagnosis I89.0 Lymphedema, not elsewhere classified 03/23/2020 No Yes T81.31XD Disruption of external operation (surgical) wound, not elsewhere classified, 03/23/2020 No Yes subsequent encounter S90.811D Abrasion, right foot, subsequent encounter 03/23/2020 No Yes L97.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 layer exposed 03/23/2020 No Yes L97.312 Non-pressure chronic ulcer of right 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 03/23/2020 No Yes L97.322 Non-pressure chronic ulcer of left 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 03/23/2020 No Yes Inactive Problems ICD-10 Code Description Active Date Inactive Date L03.115 Cellulitis of right lower limb 04/07/2020 04/07/2020 Resolved Problems Electronic  Signature(s) Signed: 04/28/2020 12:44:20 PM By: [name] Najjar MD Entered By: [name] on 04/28/2020 12:19:46 -------------------------------------------------------------------------------- Progress Note Details Patient Name: Date of Service: [name] Rhine 04/28/2020 9:45 A M Medical Record Number: [id number] Patient Account Number: [id number] Date of Birth/Sex: Treating RN: September 22, 1960 (59 y.o. [name] Primary Care Provider: [name] Other Clinician: Referring Provider: Treating Provider/Extender: [name] Berthold, [name] Weeks in Treatment: 5 Subjective History of Present Illness (HPI) 11/23/15; this is a patient I remember from a previous stay in this clinic in 2012. The patient says this was for a wound in this same area as currently although I have no information to verify that. Most of the information is provided by his caregiver from the group home where he resides. Apparently he has had a wound in this area for 8 months. He also has erythema around this area and has had multiple rounds of antibiotics apparently with some improvement but the erythema is persists. He apparently had a fracture in the ankle 2 months ago and was seen by Dr. Lajoyce Corners . He apparently had a splint applied and more recently a heel offloading boot. He had home health coming out to a week ago not clear what they are dressing this with but they apparently discharged him perhaps because of one of Dr. 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 He has a Nurse, adult for transferring at home he is nonambulatory and does not wear footwear. He is not a diabetic. Looking through [place] [facility] he appears to have fairly active primary care. The area on the ankle is variably labeled as a pressure area and/or chronic venous insufficiency. He had a recent venous ultrasound  [name], [name] ([id number]) Visit Report for 04/28/2020 HPI Details Patient Name: Date of Service: [name], [name] 04/28/2020 9:45 A M Medical Record Number: [id number] Patient Account Number: [id number] Date of Birth/Sex: Treating RN: 10/07/1960 (59 y.o. [name] SchoonerM) Boehlein, Linda Primary Care Provider: Jamison [name], [name] Other Clinician: Referring Provider: Treating Provider/Extender: [name] [name], [name] Weeks in Treatment: 5 History of Present Illness HPI Description: 11/23/15; this is a patient I remember from a previous stay in this clinic in 2012. The patient says this was for a wound in this same area as currently although I have no information to verify that. Most of the information is provided by his caregiver from the group home where he resides. Apparently he has had a wound in this area for 8 months. He also has erythema around this area and has had multiple rounds of antibiotics apparently with some improvement but the erythema is persists. He apparently had a fracture in the ankle 2 months ago and was seen by Dr. Lajoyce [name] . He apparently had a splint applied and more recently a heel offloading boot. He had home health coming out to a week ago not clear what they are dressing this with but they apparently discharged him perhaps because of one of Dr. 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 He is not a diabetic. Looking through [place] [facility] he appears to have fairly active primary care. The area on the ankle is variably labeled as a pressure area and/or chronic venous insufficiency. He had a recent venous ultrasound on 5/25 that did not show a DVT in the left leg however this was not a reflux study. There was no superficial DVT either. Arterial studies on 10/15/14 showed a left ABI of 1.16 Center right of 1.12 waveforms were triphasic he does not have  significant arterial disease. He has had recent x-rays of the left foot and ankle. The left foot did not show any abnormality. Left ankle x-ray showed a spiral fracture of the left tibial diaphysis without evidence of osteomyelitis. 11/30/15 culture I did last week showed pseudomonas I changed him to oral ciprofloxacin. Erythema is a lot better. 12/07/15 area around the wound shows considerable improvement of the periwound erythema. 12/14/15; the patient has a improvement of the periwound erythema which in retrospect I think with cellulitis successfully treated. We have been using Iodoflex started last week 12/25/15 wound on the left medial malleolus and dorsal left foot. [name] applied READMISSION 02/27/16; the patient was admitted to [name] from 8/14 through 01/18/16 initially felt to have cellulitis of his left lower leg however he was noted to have significant persistent pain and swelling. A duplex ultrasound was ordered that was negative. [name] an x-ray was done that showed a spiral fracture of the left leg. With posterior displacement and angulation at the mid left tibia. Orthopedics saw the patient and splinted the leg. He was then sent to [name] skilled facility and only return to his group home last week. He has wounds on the left medial malleolus, left dorsal foot and a worrisome area on the plantar left heel which is not really open but may represent a hematoma or a DTI 03/06/16; areas of left anterior and medial ankle both look improved. There are 3 small wounds here. He has a area over the dorsal left first metatarsal head which is not open. In meantime his left  [name], [name] ([id number]) Visit Report for 04/28/2020 HPI Details Patient Name: Date of Service: [name], [name] 04/28/2020 9:45 A M Medical Record Number: [id number] Patient Account Number: [id number] Date of Birth/Sex: Treating RN: 10/07/1960 (59 y.o. [name] SchoonerM) Boehlein, Linda Primary Care Provider: Jamison [name], [name] Other Clinician: Referring Provider: Treating Provider/Extender: [name] [name], [name] Weeks in Treatment: 5 History of Present Illness HPI Description: 11/23/15; this is a patient I remember from a previous stay in this clinic in 2012. The patient says this was for a wound in this same area as currently although I have no information to verify that. Most of the information is provided by his caregiver from the group home where he resides. Apparently he has had a wound in this area for 8 months. He also has erythema around this area and has had multiple rounds of antibiotics apparently with some improvement but the erythema is persists. He apparently had a fracture in the ankle 2 months ago and was seen by Dr. Lajoyce [name] . He apparently had a splint applied and more recently a heel offloading boot. He had home health coming out to a week ago not clear what they are dressing this with but they apparently discharged him perhaps because of one of Dr. 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 He is not a diabetic. Looking through [place] [facility] he appears to have fairly active primary care. The area on the ankle is variably labeled as a pressure area and/or chronic venous insufficiency. He had a recent venous ultrasound on 5/25 that did not show a DVT in the left leg however this was not a reflux study. There was no superficial DVT either. Arterial studies on 10/15/14 showed a left ABI of 1.16 Center right of 1.12 waveforms were triphasic he does not have  significant arterial disease. He has had recent x-rays of the left foot and ankle. The left foot did not show any abnormality. Left ankle x-ray showed a spiral fracture of the left tibial diaphysis without evidence of osteomyelitis. 11/30/15 culture I did last week showed pseudomonas I changed him to oral ciprofloxacin. Erythema is a lot better. 12/07/15 area around the wound shows considerable improvement of the periwound erythema. 12/14/15; the patient has a improvement of the periwound erythema which in retrospect I think with cellulitis successfully treated. We have been using Iodoflex started last week 12/25/15 wound on the left medial malleolus and dorsal left foot. [name] applied READMISSION 02/27/16; the patient was admitted to [name] from 8/14 through 01/18/16 initially felt to have cellulitis of his left lower leg however he was noted to have significant persistent pain and swelling. A duplex ultrasound was ordered that was negative. [name] an x-ray was done that showed a spiral fracture of the left leg. With posterior displacement and angulation at the mid left tibia. Orthopedics saw the patient and splinted the leg. He was then sent to [name] skilled facility and only return to his group home last week. He has wounds on the left medial malleolus, left dorsal foot and a worrisome area on the plantar left heel which is not really open but may represent a hematoma or a DTI 03/06/16; areas of left anterior and medial ankle both look improved. There are 3 small wounds here. He has a area over the dorsal left first metatarsal head which is not open. In meantime his left  Non-pressure chronic ulcer of other part of right foot with fat layer exposed) Electronic Signature(s) Signed: 04/28/2020 12:44:20 PM By: [name] Najjar MD Signed: 04/28/2020 5:01:53 PM By: [name] Deed RN, BSN Entered By: [name] on 04/28/2020 11:42:17 Prescription 04/28/2020 -------------------------------------------------------------------------------- Madie Reno MD Patient  Name: Provider: 03-Oct-1960 [id number] Date of Birth: NPI#[name] Sex: DEA #: [phone] Phone #: License #: Eligha Bridegroom [facility] Patient Address: [address] [place], [place] [address] [address] [place], [place] [address] [phone] Allergies adhesive tape Provider's Orders X-ray, foot right complete view - ICD10: L97.512 - worsening nonhealing ulcer right lateral foot Hand Signature: Date(s): Electronic Signature(s) Signed: 04/28/2020 12:44:20 PM By: [name] Najjar MD Signed: 04/28/2020 5:01:53 PM By: [name] Deed RN, BSN Entered By: [name] on 04/28/2020 11:42:19 -------------------------------------------------------------------------------- Problem List Details Patient Name: Date of Service: [name] 04/28/2020 9:45 A M Medical Record Number: [id number] Patient Account Number: [id number] Date of Birth/Sex: Treating RN: Oct 22, 1960 (59 y.o. [name] Primary Care Provider: [name] Other Clinician: Referring Provider: Treating Provider/Extender: [name] Berthold, [name] Weeks in Treatment: 5 Active Problems ICD-10 Encounter Code Description Active Date MDM Diagnosis I89.0 Lymphedema, not elsewhere classified 03/23/2020 No Yes T81.31XD Disruption of external operation (surgical) wound, not elsewhere classified, 03/23/2020 No Yes subsequent encounter S90.811D Abrasion, right foot, subsequent encounter 03/23/2020 No Yes L97.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 layer exposed 03/23/2020 No Yes L97.312 Non-pressure chronic ulcer of right 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 03/23/2020 No Yes L97.322 Non-pressure chronic ulcer of left 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 03/23/2020 No Yes Inactive Problems ICD-10 Code Description Active Date Inactive Date L03.115 Cellulitis of right lower limb 04/07/2020 04/07/2020 Resolved Problems Electronic  Signature(s) Signed: 04/28/2020 12:44:20 PM By: [name] Najjar MD Entered By: [name] on 04/28/2020 12:19:46 -------------------------------------------------------------------------------- Progress Note Details Patient Name: Date of Service: [name] Rhine 04/28/2020 9:45 A M Medical Record Number: [id number] Patient Account Number: [id number] Date of Birth/Sex: Treating RN: September 22, 1960 (59 y.o. [name] Primary Care Provider: [name] Other Clinician: Referring Provider: Treating Provider/Extender: [name] Berthold, [name] Weeks in Treatment: 5 Subjective History of Present Illness (HPI) 11/23/15; this is a patient I remember from a previous stay in this clinic in 2012. The patient says this was for a wound in this same area as currently although I have no information to verify that. Most of the information is provided by his caregiver from the group home where he resides. Apparently he has had a wound in this area for 8 months. He also has erythema around this area and has had multiple rounds of antibiotics apparently with some improvement but the erythema is persists. He apparently had a fracture in the ankle 2 months ago and was seen by Dr. Lajoyce Corners . He apparently had a splint applied and more recently a heel offloading boot. He had home health coming out to a week ago not clear what they are dressing this with but they apparently discharged him perhaps because of one of Dr. 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 He has a Nurse, adult for transferring at home he is nonambulatory and does not wear footwear. He is not a diabetic. Looking through [place] [facility] he appears to have fairly active primary care. The area on the ankle is variably labeled as a pressure area and/or chronic venous insufficiency. He had a recent venous ultrasound  [name], [name] ([id number]) Visit Report for 04/28/2020 HPI Details Patient Name: Date of Service: [name], [name] 04/28/2020 9:45 A M Medical Record Number: [id number] Patient Account Number: [id number] Date of Birth/Sex: Treating RN: 10/07/1960 (59 y.o. [name] SchoonerM) Boehlein, Linda Primary Care Provider: Jamison [name], [name] Other Clinician: Referring Provider: Treating Provider/Extender: [name] [name], [name] Weeks in Treatment: 5 History of Present Illness HPI Description: 11/23/15; this is a patient I remember from a previous stay in this clinic in 2012. The patient says this was for a wound in this same area as currently although I have no information to verify that. Most of the information is provided by his caregiver from the group home where he resides. Apparently he has had a wound in this area for 8 months. He also has erythema around this area and has had multiple rounds of antibiotics apparently with some improvement but the erythema is persists. He apparently had a fracture in the ankle 2 months ago and was seen by Dr. Lajoyce [name] . He apparently had a splint applied and more recently a heel offloading boot. He had home health coming out to a week ago not clear what they are dressing this with but they apparently discharged him perhaps because of one of Dr. 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 He is not a diabetic. Looking through [place] [facility] he appears to have fairly active primary care. The area on the ankle is variably labeled as a pressure area and/or chronic venous insufficiency. He had a recent venous ultrasound on 5/25 that did not show a DVT in the left leg however this was not a reflux study. There was no superficial DVT either. Arterial studies on 10/15/14 showed a left ABI of 1.16 Center right of 1.12 waveforms were triphasic he does not have  significant arterial disease. He has had recent x-rays of the left foot and ankle. The left foot did not show any abnormality. Left ankle x-ray showed a spiral fracture of the left tibial diaphysis without evidence of osteomyelitis. 11/30/15 culture I did last week showed pseudomonas I changed him to oral ciprofloxacin. Erythema is a lot better. 12/07/15 area around the wound shows considerable improvement of the periwound erythema. 12/14/15; the patient has a improvement of the periwound erythema which in retrospect I think with cellulitis successfully treated. We have been using Iodoflex started last week 12/25/15 wound on the left medial malleolus and dorsal left foot. [name] applied READMISSION 02/27/16; the patient was admitted to [name] from 8/14 through 01/18/16 initially felt to have cellulitis of his left lower leg however he was noted to have significant persistent pain and swelling. A duplex ultrasound was ordered that was negative. [name] an x-ray was done that showed a spiral fracture of the left leg. With posterior displacement and angulation at the mid left tibia. Orthopedics saw the patient and splinted the leg. He was then sent to [name] skilled facility and only return to his group home last week. He has wounds on the left medial malleolus, left dorsal foot and a worrisome area on the plantar left heel which is not really open but may represent a hematoma or a DTI 03/06/16; areas of left anterior and medial ankle both look improved. There are 3 small wounds here. He has a area over the dorsal left first metatarsal head which is not open. In meantime his left  Record Number: [id number] Patient Account Number: [id number] Date of Birth/Sex: Treating RN: [birth year] (59 y.o. 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 Bathing/ Shower/ Hygiene May shower with protection but do not get wound dressing(s) wet. Edema Control - Lymphedema / SCD / Other Bilateral Lower Extremities Elevate legs to the level of the heart or above for 30 minutes daily and/or when sitting, a frequency of: Off-Loading Heel suspension boot to: - heel protectors at all times Home Health No change in wound care orders this week; continue Home Health for wound care. May utilize formulary equivalent dressing for wound treatment orders unless otherwise specified. Other Home Health Orders/Instructions: - [facility]ound Treatment Wound #40 - Lower Leg Wound Laterality: Left, Medial Cleanser: Wound Cleanser (Home Health) 2 x Per Week/30 Days Discharge Instructions: Cleanse the wound with wound cleanser prior to applying a clean dressing using gauze sponges, not tissue or cotton balls. Peri-Wound Care: Sween Lotion (Moisturizing lotion) (Home Health) 2 x Per Week/30 Days Discharge Instructions: Apply moisturizing lotion with dressing changes Prim Dressing: Hydrofera Blue Classic Foam, 4x4 in (Home Health) 2 x Per Week/30 Days ary Discharge Instructions: Apply to wound bed as instructed Secondary Dressing: ABD Pad, 5x9 (Home Health) 2 x Per Week/30 Days Discharge Instructions: Apply over primary dressing as directed. Compression Wrap: Kerlix Roll 4.5x3.1 (in/yd) (Home Health) 2 x Per Week/30 Days Discharge Instructions: Apply Kerlix and Coban compression as directed. Compression Wrap: Coban Self-Adherent Wrap 4x5 (in/yd) (Home Health) 2 x Per Week/30 Days Discharge Instructions: Apply over Kerlix as directed. Wound #41 - T Fourth oe Wound Laterality: Right Cleanser: Wound Cleanser (Home Health) 2 x Per Week/30 Days Discharge Instructions: Cleanse the wound with wound cleanser prior to applying a clean dressing using gauze sponges, not tissue or cotton balls. Peri-Wound Care:  Sween Lotion (Moisturizing lotion) 2 x Per Week/30 Days Discharge Instructions: Apply moisturizing lotion with dressing changes Prim Dressing: IODOFLEX 0.9% Cadexomer Iodine Pad 4x6 cm (Home Health) 2 x Per Week/30 Days ary Discharge Instructions: Apply to wound bed as instructed Secondary Dressing: Woven Gauze Sponge, Non-Sterile 4x4 in (Home Health) 2 x Per Week/30 Days Discharge Instructions: Apply over primary dressing as directed. Secondary Dressing: ABD Pad, 5x9 (Home Health) 2 x Per Week/30 Days Discharge Instructions: Apply over primary dressing as directed. Compression Wrap: Kerlix Roll 4.5x3.1 (in/yd) (Home Health) 2 x Per Week/30 Days Discharge Instructions: Apply Kerlix and Coban compression as directed. Compression Wrap: Coban Self-Adherent Wrap 4x5 (in/yd) (Home Health) 2 x Per Week/30 Days Discharge Instructions: Apply over Kerlix as directed. Wound #42 - Malleolus Wound Laterality: Right, Medial Cleanser: Wound Cleanser (Home Health) 2 x Per Week/30 Days Discharge Instructions: Cleanse the wound with wound cleanser prior to applying a clean dressing using gauze sponges, not tissue or cotton balls. Peri-Wound Care: Sween Lotion (Moisturizing lotion) 2 x Per Week/30 Days Discharge Instructions: Apply moisturizing lotion with dressing changes Prim Dressing: IODOFLEX 0.9% Cadexomer Iodine Pad  [name], [name] ([id number]) Visit Report for 04/28/2020 HPI Details Patient Name: Date of Service: [name], [name] 04/28/2020 9:45 A M Medical Record Number: [id number] Patient Account Number: [id number] Date of Birth/Sex: Treating RN: 10/07/1960 (59 y.o. [name] SchoonerM) Boehlein, Linda Primary Care Provider: Jamison [name], [name] Other Clinician: Referring Provider: Treating Provider/Extender: [name] [name], [name] Weeks in Treatment: 5 History of Present Illness HPI Description: 11/23/15; this is a patient I remember from a previous stay in this clinic in 2012. The patient says this was for a wound in this same area as currently although I have no information to verify that. Most of the information is provided by his caregiver from the group home where he resides. Apparently he has had a wound in this area for 8 months. He also has erythema around this area and has had multiple rounds of antibiotics apparently with some improvement but the erythema is persists. He apparently had a fracture in the ankle 2 months ago and was seen by Dr. Lajoyce [name] . He apparently had a splint applied and more recently a heel offloading boot. He had home health coming out to a week ago not clear what they are dressing this with but they apparently discharged him perhaps because of one of Dr. 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 He is not a diabetic. Looking through [place] [facility] he appears to have fairly active primary care. The area on the ankle is variably labeled as a pressure area and/or chronic venous insufficiency. He had a recent venous ultrasound on 5/25 that did not show a DVT in the left leg however this was not a reflux study. There was no superficial DVT either. Arterial studies on 10/15/14 showed a left ABI of 1.16 Center right of 1.12 waveforms were triphasic he does not have  significant arterial disease. He has had recent x-rays of the left foot and ankle. The left foot did not show any abnormality. Left ankle x-ray showed a spiral fracture of the left tibial diaphysis without evidence of osteomyelitis. 11/30/15 culture I did last week showed pseudomonas I changed him to oral ciprofloxacin. Erythema is a lot better. 12/07/15 area around the wound shows considerable improvement of the periwound erythema. 12/14/15; the patient has a improvement of the periwound erythema which in retrospect I think with cellulitis successfully treated. We have been using Iodoflex started last week 12/25/15 wound on the left medial malleolus and dorsal left foot. [name] applied READMISSION 02/27/16; the patient was admitted to [name] from 8/14 through 01/18/16 initially felt to have cellulitis of his left lower leg however he was noted to have significant persistent pain and swelling. A duplex ultrasound was ordered that was negative. [name] an x-ray was done that showed a spiral fracture of the left leg. With posterior displacement and angulation at the mid left tibia. Orthopedics saw the patient and splinted the leg. He was then sent to [name] skilled facility and only return to his group home last week. He has wounds on the left medial malleolus, left dorsal foot and a worrisome area on the plantar left heel which is not really open but may represent a hematoma or a DTI 03/06/16; areas of left anterior and medial ankle both look improved. There are 3 small wounds here. He has a area over the dorsal left first metatarsal head which is not open. In meantime his left  [name], [name] ([id number]) Visit Report for 04/28/2020 HPI Details Patient Name: Date of Service: [name], [name] 04/28/2020 9:45 A M Medical Record Number: [id number] Patient Account Number: [id number] Date of Birth/Sex: Treating RN: 10/07/1960 (59 y.o. [name] SchoonerM) Boehlein, Linda Primary Care Provider: Jamison [name], [name] Other Clinician: Referring Provider: Treating Provider/Extender: [name] [name], [name] Weeks in Treatment: 5 History of Present Illness HPI Description: 11/23/15; this is a patient I remember from a previous stay in this clinic in 2012. The patient says this was for a wound in this same area as currently although I have no information to verify that. Most of the information is provided by his caregiver from the group home where he resides. Apparently he has had a wound in this area for 8 months. He also has erythema around this area and has had multiple rounds of antibiotics apparently with some improvement but the erythema is persists. He apparently had a fracture in the ankle 2 months ago and was seen by Dr. Lajoyce [name] . He apparently had a splint applied and more recently a heel offloading boot. He had home health coming out to a week ago not clear what they are dressing this with but they apparently discharged him perhaps because of one of Dr. 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 He is not a diabetic. Looking through [place] [facility] he appears to have fairly active primary care. The area on the ankle is variably labeled as a pressure area and/or chronic venous insufficiency. He had a recent venous ultrasound on 5/25 that did not show a DVT in the left leg however this was not a reflux study. There was no superficial DVT either. Arterial studies on 10/15/14 showed a left ABI of 1.16 Center right of 1.12 waveforms were triphasic he does not have  significant arterial disease. He has had recent x-rays of the left foot and ankle. The left foot did not show any abnormality. Left ankle x-ray showed a spiral fracture of the left tibial diaphysis without evidence of osteomyelitis. 11/30/15 culture I did last week showed pseudomonas I changed him to oral ciprofloxacin. Erythema is a lot better. 12/07/15 area around the wound shows considerable improvement of the periwound erythema. 12/14/15; the patient has a improvement of the periwound erythema which in retrospect I think with cellulitis successfully treated. We have been using Iodoflex started last week 12/25/15 wound on the left medial malleolus and dorsal left foot. [name] applied READMISSION 02/27/16; the patient was admitted to [name] from 8/14 through 01/18/16 initially felt to have cellulitis of his left lower leg however he was noted to have significant persistent pain and swelling. A duplex ultrasound was ordered that was negative. [name] an x-ray was done that showed a spiral fracture of the left leg. With posterior displacement and angulation at the mid left tibia. Orthopedics saw the patient and splinted the leg. He was then sent to [name] skilled facility and only return to his group home last week. He has wounds on the left medial malleolus, left dorsal foot and a worrisome area on the plantar left heel which is not really open but may represent a hematoma or a DTI 03/06/16; areas of left anterior and medial ankle both look improved. There are 3 small wounds here. He has a area over the dorsal left first metatarsal head which is not open. In meantime his left  Non-pressure chronic ulcer of other part of right foot with fat layer exposed) Electronic Signature(s) Signed: 04/28/2020 12:44:20 PM By: [name] Najjar MD Signed: 04/28/2020 5:01:53 PM By: [name] Deed RN, BSN Entered By: [name] on 04/28/2020 11:42:17 Prescription 04/28/2020 -------------------------------------------------------------------------------- Madie Reno MD Patient  Name: Provider: 03-Oct-1960 [id number] Date of Birth: NPI#[name] Sex: DEA #: [id number] [id number] Phone #: License #: Eligha Bridegroom [facility] Patient Address: [address] [place], [place] [address] [address] Floor [place], [place] [address] [phone] Allergies adhesive tape Provider's Orders X-ray, foot right complete view - ICD10: L97.512 - worsening nonhealing ulcer right lateral foot Hand Signature: Date(s): Electronic Signature(s) Signed: 04/28/2020 12:44:20 PM By: [name] Najjar MD Signed: 04/28/2020 5:01:53 PM By: [name] Deed RN, BSN Entered By: [name] on 04/28/2020 11:42:19 -------------------------------------------------------------------------------- Problem List Details Patient Name: Date of Service: [name] 04/28/2020 9:45 A M Medical Record Number: [id number] Patient Account Number: [id number] Date of Birth/Sex: Treating RN: Oct 22, 1960 (59 y.o. [name] Primary Care Provider: [name] Other Clinician: Referring Provider: Treating Provider/Extender: [name] Berthold, [name] Weeks in Treatment: 5 Active Problems ICD-10 Encounter Code Description Active Date MDM Diagnosis I89.0 Lymphedema, not elsewhere classified 03/23/2020 No Yes T81.31XD Disruption of external operation (surgical) wound, not elsewhere classified, 03/23/2020 No Yes subsequent encounter S90.811D Abrasion, right foot, subsequent encounter 03/23/2020 No Yes L97.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 layer exposed 03/23/2020 No Yes L97.312 Non-pressure chronic ulcer of right 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 03/23/2020 No Yes L97.322 Non-pressure chronic ulcer of left 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 03/23/2020 No Yes Inactive Problems ICD-10 Code Description Active Date Inactive Date L03.115 Cellulitis of right lower limb 04/07/2020 04/07/2020 Resolved Problems Electronic  Signature(s) Signed: 04/28/2020 12:44:20 PM By: [name] Najjar MD Entered By: [name] on 04/28/2020 12:19:46 -------------------------------------------------------------------------------- Progress Note Details Patient Name: Date of Service: [name] Rhine 04/28/2020 9:45 A M Medical Record Number: [id number] Patient Account Number: [id number] Date of Birth/Sex: Treating RN: September 22, 1960 (59 y.o. [name] Primary Care Provider: [name] Other Clinician: Referring Provider: Treating Provider/Extender: [name] Berthold, [name] Weeks in Treatment: 5 Subjective History of Present Illness (HPI) 11/23/15; this is a patient I remember from a previous stay in this clinic in 2012. The patient says this was for a wound in this same area as currently although I have no information to verify that. Most of the information is provided by his caregiver from the group home where he resides. Apparently he has had a wound in this area for 8 months. He also has erythema around this area and has had multiple rounds of antibiotics apparently with some improvement but the erythema is persists. He apparently had a fracture in the ankle 2 months ago and was seen by Dr. Lajoyce Corners . He apparently had a splint applied and more recently a heel offloading boot. He had home health coming out to a week ago not clear what they are dressing this with but they apparently discharged him perhaps because of one of Dr. 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 He has a Nurse, adult for transferring at home he is nonambulatory and does not wear footwear. He is not a diabetic. Looking through [place] [facility] he appears to have fairly active primary care. The area on the ankle is variably labeled as a pressure area and/or chronic venous insufficiency. He had a recent venous ultrasound  Non-pressure chronic ulcer of other part of right foot with fat layer exposed) Electronic Signature(s) Signed: 04/28/2020 12:44:20 PM By: [name] Najjar MD Signed: 04/28/2020 5:01:53 PM By: [name] Deed RN, BSN Entered By: [name] on 04/28/2020 11:42:17 Prescription 04/28/2020 -------------------------------------------------------------------------------- Madie Reno MD Patient  Name: Provider: 03-Oct-1960 [id number] Date of Birth: NPI#[name] Sex: DEA #: [id number] [id number] Phone #: License #: Eligha Bridegroom [facility] Patient Address: [address] [place], [place] [address] [address] Floor [place], [place] [address] [phone] Allergies adhesive tape Provider's Orders X-ray, foot right complete view - ICD10: L97.512 - worsening nonhealing ulcer right lateral foot Hand Signature: Date(s): Electronic Signature(s) Signed: 04/28/2020 12:44:20 PM By: [name] Najjar MD Signed: 04/28/2020 5:01:53 PM By: [name] Deed RN, BSN Entered By: [name] on 04/28/2020 11:42:19 -------------------------------------------------------------------------------- Problem List Details Patient Name: Date of Service: [name] 04/28/2020 9:45 A M Medical Record Number: [id number] Patient Account Number: [id number] Date of Birth/Sex: Treating RN: Oct 22, 1960 (59 y.o. [name] Primary Care Provider: [name] Other Clinician: Referring Provider: Treating Provider/Extender: [name] Berthold, [name] Weeks in Treatment: 5 Active Problems ICD-10 Encounter Code Description Active Date MDM Diagnosis I89.0 Lymphedema, not elsewhere classified 03/23/2020 No Yes T81.31XD Disruption of external operation (surgical) wound, not elsewhere classified, 03/23/2020 No Yes subsequent encounter S90.811D Abrasion, right foot, subsequent encounter 03/23/2020 No Yes L97.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 layer exposed 03/23/2020 No Yes L97.312 Non-pressure chronic ulcer of right 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 03/23/2020 No Yes L97.322 Non-pressure chronic ulcer of left 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 03/23/2020 No Yes Inactive Problems ICD-10 Code Description Active Date Inactive Date L03.115 Cellulitis of right lower limb 04/07/2020 04/07/2020 Resolved Problems Electronic  Signature(s) Signed: 04/28/2020 12:44:20 PM By: [name] Najjar MD Entered By: [name] on 04/28/2020 12:19:46 -------------------------------------------------------------------------------- Progress Note Details Patient Name: Date of Service: [name] Rhine 04/28/2020 9:45 A M Medical Record Number: [id number] Patient Account Number: [id number] Date of Birth/Sex: Treating RN: September 22, 1960 (59 y.o. [name] Primary Care Provider: [name] Other Clinician: Referring Provider: Treating Provider/Extender: [name] Berthold, [name] Weeks in Treatment: 5 Subjective History of Present Illness (HPI) 11/23/15; this is a patient I remember from a previous stay in this clinic in 2012. The patient says this was for a wound in this same area as currently although I have no information to verify that. Most of the information is provided by his caregiver from the group home where he resides. Apparently he has had a wound in this area for 8 months. He also has erythema around this area and has had multiple rounds of antibiotics apparently with some improvement but the erythema is persists. He apparently had a fracture in the ankle 2 months ago and was seen by Dr. Lajoyce Corners . He apparently had a splint applied and more recently a heel offloading boot. He had home health coming out to a week ago not clear what they are dressing this with but they apparently discharged him perhaps because of one of Dr. 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 He has a Nurse, adult for transferring at home he is nonambulatory and does not wear footwear. He is not a diabetic. Looking through [place] [facility] he appears to have fairly active primary care. The area on the ankle is variably labeled as a pressure area and/or chronic venous insufficiency. He had a recent venous ultrasound  Record Number: [id number] Patient Account Number: [id number] Date of Birth/Sex: Treating RN: [birth year] (59 y.o. 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 Bathing/ Shower/ Hygiene May shower with protection but do not get wound dressing(s) wet. Edema Control - Lymphedema / SCD / Other Bilateral Lower Extremities Elevate legs to the level of the heart or above for 30 minutes daily and/or when sitting, a frequency of: Off-Loading Heel suspension boot to: - heel protectors at all times Home Health No change in wound care orders this week; continue Home Health for wound care. May utilize formulary equivalent dressing for wound treatment orders unless otherwise specified. Other Home Health Orders/Instructions: - [facility]ound [facility] Wound #40 - Lower Leg Wound Laterality: Left, Medial Cleanser: Wound Cleanser (Home Health) 2 x Per Week/30 Days Discharge Instructions: Cleanse the wound with wound cleanser prior to applying a clean dressing using gauze sponges, not tissue or cotton balls. Peri-Wound Care: Sween Lotion (Moisturizing lotion) (Home Health) 2 x Per Week/30 Days Discharge Instructions: Apply moisturizing lotion with dressing changes Prim Dressing: Hydrofera Blue Classic Foam, 4x4 in (Home Health) 2 x Per Week/30 Days ary Discharge Instructions: Apply to wound bed as instructed Secondary Dressing: ABD Pad, 5x9 (Home Health) 2 x Per Week/30 Days Discharge Instructions: Apply over primary dressing as directed. Compression Wrap: Kerlix Roll 4.5x3.1 (in/yd) (Home Health) 2 x Per Week/30 Days Discharge Instructions: Apply Kerlix and Coban compression as directed. Compression Wrap: Coban Self-Adherent Wrap 4x5 (in/yd) (Home Health) 2 x Per Week/30 Days Discharge Instructions: Apply over Kerlix as directed. Wound #41 - T Fourth oe Wound Laterality: Right Cleanser: Wound Cleanser (Home Health) 2 x Per Week/30 Days Discharge Instructions: Cleanse the wound with wound cleanser prior to applying a clean dressing using gauze sponges, not tissue or cotton balls. Peri-Wound Care:  Sween Lotion (Moisturizing lotion) 2 x Per Week/30 Days Discharge Instructions: Apply moisturizing lotion with dressing changes Prim Dressing: IODOFLEX 0.9% Cadexomer Iodine Pad 4x6 cm (Home Health) 2 x Per Week/30 Days ary Discharge Instructions: Apply to wound bed as instructed Secondary Dressing: Woven Gauze Sponge, Non-Sterile 4x4 in (Home Health) 2 x Per Week/30 Days Discharge Instructions: Apply over primary dressing as directed. Secondary Dressing: ABD Pad, 5x9 (Home Health) 2 x Per Week/30 Days Discharge Instructions: Apply over primary dressing as directed. Compression Wrap: Kerlix Roll 4.5x3.1 (in/yd) (Home Health) 2 x Per Week/30 Days Discharge Instructions: Apply Kerlix and Coban compression as directed. Compression Wrap: Coban Self-Adherent Wrap 4x5 (in/yd) (Home Health) 2 x Per Week/30 Days Discharge Instructions: Apply over Kerlix as directed. Wound #42 - Malleolus Wound Laterality: Right, Medial Cleanser: Wound Cleanser (Home Health) 2 x Per Week/30 Days Discharge Instructions: Cleanse the wound with wound cleanser prior to applying a clean dressing using gauze sponges, not tissue or cotton balls. Peri-Wound Care: Sween Lotion (Moisturizing lotion) 2 x Per Week/30 Days Discharge Instructions: Apply moisturizing lotion with dressing changes Prim Dressing: IODOFLEX 0.9% Cadexomer Iodine Pad  [name], [name] ([id number]) Visit Report for 04/28/2020 HPI Details Patient Name: Date of Service: [name], [name] 04/28/2020 9:45 A M Medical Record Number: [id number] Patient Account Number: [id number] Date of Birth/Sex: Treating RN: 10/07/1960 (59 y.o. [name] SchoonerM) Boehlein, Linda Primary Care Provider: Jamison [name], [name] Other Clinician: Referring Provider: Treating Provider/Extender: [name] [name], [name] Weeks in Treatment: 5 History of Present Illness HPI Description: 11/23/15; this is a patient I remember from a previous stay in this clinic in 2012. The patient says this was for a wound in this same area as currently although I have no information to verify that. Most of the information is provided by his caregiver from the group home where he resides. Apparently he has had a wound in this area for 8 months. He also has erythema around this area and has had multiple rounds of antibiotics apparently with some improvement but the erythema is persists. He apparently had a fracture in the ankle 2 months ago and was seen by Dr. Lajoyce [name] . He apparently had a splint applied and more recently a heel offloading boot. He had home health coming out to a week ago not clear what they are dressing this with but they apparently discharged him perhaps because of one of Dr. 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 He is not a diabetic. Looking through [place] [facility] he appears to have fairly active primary care. The area on the ankle is variably labeled as a pressure area and/or chronic venous insufficiency. He had a recent venous ultrasound on 5/25 that did not show a DVT in the left leg however this was not a reflux study. There was no superficial DVT either. Arterial studies on 10/15/14 showed a left ABI of 1.16 Center right of 1.12 waveforms were triphasic he does not have  significant arterial disease. He has had recent x-rays of the left foot and ankle. The left foot did not show any abnormality. Left ankle x-ray showed a spiral fracture of the left tibial diaphysis without evidence of osteomyelitis. 11/30/15 culture I did last week showed pseudomonas I changed him to oral ciprofloxacin. Erythema is a lot better. 12/07/15 area around the wound shows considerable improvement of the periwound erythema. 12/14/15; the patient has a improvement of the periwound erythema which in retrospect I think with cellulitis successfully treated. We have been using Iodoflex started last week 12/25/15 wound on the left medial malleolus and dorsal left foot. [name] applied READMISSION 02/27/16; the patient was admitted to [name] from 8/14 through 01/18/16 initially felt to have cellulitis of his left lower leg however he was noted to have significant persistent pain and swelling. A duplex ultrasound was ordered that was negative. [name] an x-ray was done that showed a spiral fracture of the left leg. With posterior displacement and angulation at the mid left tibia. Orthopedics saw the patient and splinted the leg. He was then sent to [name] skilled facility and only return to his group home last week. He has wounds on the left medial malleolus, left dorsal foot and a worrisome area on the plantar left heel which is not really open but may represent a hematoma or a DTI 03/06/16; areas of left anterior and medial ankle both look improved. There are 3 small wounds here. He has a area over the dorsal left first metatarsal head which is not open. In meantime his left  Non-pressure chronic ulcer of other part of right foot with fat layer exposed) Electronic Signature(s) Signed: 04/28/2020 12:44:20 PM By: [name] Najjar MD Signed: 04/28/2020 5:01:53 PM By: [name] Deed RN, BSN Entered By: [name] on 04/28/2020 11:42:17 Prescription 04/28/2020 -------------------------------------------------------------------------------- Madie Reno MD Patient  Name: Provider: 03-Oct-1960 [id number] Date of Birth: NPI#[name] Sex: DEA #: [id number] [phone] Phone #: License #: Eligha Bridegroom [facility] Patient Address: [address] [place], [place] [address] [address] [place], [place] [address] [phone] Allergies adhesive tape Provider's Orders X-ray, foot right complete view - ICD10: L97.512 - worsening nonhealing ulcer right lateral foot Hand Signature: Date(s): Electronic Signature(s) Signed: 04/28/2020 12:44:20 PM By: [name] Najjar MD Signed: 04/28/2020 5:01:53 PM By: [name] Deed RN, BSN Entered By: [name] on 04/28/2020 11:42:19 -------------------------------------------------------------------------------- Problem List Details Patient Name: Date of Service: [name] 04/28/2020 9:45 A M Medical Record Number: [id number] Patient Account Number: [id number] Date of Birth/Sex: Treating RN: Oct 22, 1960 (59 y.o. [name] Primary Care Provider: [name] Other Clinician: Referring Provider: Treating Provider/Extender: [name] Berthold, [name] Weeks in Treatment: 5 Active Problems ICD-10 Encounter Code Description Active Date MDM Diagnosis I89.0 Lymphedema, not elsewhere classified 03/23/2020 No Yes T81.31XD Disruption of external operation (surgical) wound, not elsewhere classified, 03/23/2020 No Yes subsequent encounter S90.811D Abrasion, right foot, subsequent encounter 03/23/2020 No Yes L97.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 layer exposed 03/23/2020 No Yes L97.312 Non-pressure chronic ulcer of right 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 03/23/2020 No Yes L97.322 Non-pressure chronic ulcer of left 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 03/23/2020 No Yes Inactive Problems ICD-10 Code Description Active Date Inactive Date L03.115 Cellulitis of right lower limb 04/07/2020 04/07/2020 Resolved Problems Electronic  Signature(s) Signed: 04/28/2020 12:44:20 PM By: [name] Najjar MD Entered By: [name] on 04/28/2020 12:19:46 -------------------------------------------------------------------------------- Progress Note Details Patient Name: Date of Service: [name] Rhine 04/28/2020 9:45 A M Medical Record Number: [id number] Patient Account Number: [id number] Date of Birth/Sex: Treating RN: September 22, 1960 (59 y.o. [name] Primary Care Provider: [name] Other Clinician: Referring Provider: Treating Provider/Extender: [name] Berthold, [name] Weeks in Treatment: 5 Subjective History of Present Illness (HPI) 11/23/15; this is a patient I remember from a previous stay in this clinic in 2012. The patient says this was for a wound in this same area as currently although I have no information to verify that. Most of the information is provided by his caregiver from the group home where he resides. Apparently he has had a wound in this area for 8 months. He also has erythema around this area and has had multiple rounds of antibiotics apparently with some improvement but the erythema is persists. He apparently had a fracture in the ankle 2 months ago and was seen by Dr. Lajoyce Corners . He apparently had a splint applied and more recently a heel offloading boot. He had home health coming out to a week ago not clear what they are dressing this with but they apparently discharged him perhaps because of one of Dr. 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 He has a Nurse, adult for transferring at home he is nonambulatory and does not wear footwear. He is not a diabetic. Looking through [place] [facility] he appears to have fairly active primary care. The area on the ankle is variably labeled as a pressure area and/or chronic venous insufficiency. He had a recent venous ultrasound  Record Number: [id number] Patient Account Number: [id number] Date of Birth/Sex: Treating RN: [birth year] (59 y.o. 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 Bathing/ Shower/ Hygiene May shower with protection but do not get wound dressing(s) wet. Edema Control - Lymphedema / SCD / Other Bilateral Lower Extremities Elevate legs to the level of the heart or above for 30 minutes daily and/or when sitting, a frequency of: Off-Loading Heel suspension boot to: - heel protectors at all times Home Health No change in wound care orders this week; continue Home Health for wound care. May utilize formulary equivalent dressing for wound treatment orders unless otherwise specified. Other Home Health Orders/Instructions: - [facility] Wound #40 - Lower Leg Wound Laterality: Left, Medial Cleanser: Wound Cleanser (Home Health) 2 x Per Week/30 Days Discharge Instructions: Cleanse the wound with wound cleanser prior to applying a clean dressing using gauze sponges, not tissue or cotton balls. Peri-Wound Care: Sween Lotion (Moisturizing lotion) (Home Health) 2 x Per Week/30 Days Discharge Instructions: Apply moisturizing lotion with dressing changes Prim Dressing: Hydrofera Blue Classic Foam, 4x4 in (Home Health) 2 x Per Week/30 Days ary Discharge Instructions: Apply to wound bed as instructed Secondary Dressing: ABD Pad, 5x9 (Home Health) 2 x Per Week/30 Days Discharge Instructions: Apply over primary dressing as directed. Compression Wrap: Kerlix Roll 4.5x3.1 (in/yd) (Home Health) 2 x Per Week/30 Days Discharge Instructions: Apply Kerlix and Coban compression as directed. Compression Wrap: Coban Self-Adherent Wrap 4x5 (in/yd) (Home Health) 2 x Per Week/30 Days Discharge Instructions: Apply over Kerlix as directed. Wound #41 - T Fourth oe Wound Laterality: Right Cleanser: Wound Cleanser (Home Health) 2 x Per Week/30 Days Discharge Instructions: Cleanse the wound with wound cleanser prior to applying a clean dressing using gauze sponges, not tissue or cotton balls. Peri-Wound Care:  Sween Lotion (Moisturizing lotion) 2 x Per Week/30 Days Discharge Instructions: Apply moisturizing lotion with dressing changes Prim Dressing: IODOFLEX 0.9% Cadexomer Iodine Pad 4x6 cm (Home Health) 2 x Per Week/30 Days ary Discharge Instructions: Apply to wound bed as instructed Secondary Dressing: Woven Gauze Sponge, Non-Sterile 4x4 in (Home Health) 2 x Per Week/30 Days Discharge Instructions: Apply over primary dressing as directed. Secondary Dressing: ABD Pad, 5x9 (Home Health) 2 x Per Week/30 Days Discharge Instructions: Apply over primary dressing as directed. Compression Wrap: Kerlix Roll 4.5x3.1 (in/yd) (Home Health) 2 x Per Week/30 Days Discharge Instructions: Apply Kerlix and Coban compression as directed. Compression Wrap: Coban Self-Adherent Wrap 4x5 (in/yd) (Home Health) 2 x Per Week/30 Days Discharge Instructions: Apply over Kerlix as directed. Wound #42 - Malleolus Wound Laterality: Right, Medial Cleanser: Wound Cleanser (Home Health) 2 x Per Week/30 Days Discharge Instructions: Cleanse the wound with wound cleanser prior to applying a clean dressing using gauze sponges, not tissue or cotton balls. Peri-Wound Care: Sween Lotion (Moisturizing lotion) 2 x Per Week/30 Days Discharge Instructions: Apply moisturizing lotion with dressing changes Prim Dressing: IODOFLEX 0.9% Cadexomer Iodine Pad  Non-pressure chronic ulcer of other part of right foot with fat layer exposed) Electronic Signature(s) Signed: 04/28/2020 12:44:20 PM By: [name] Najjar MD Signed: 04/28/2020 5:01:53 PM By: [name] Deed RN, BSN Entered By: [name] on 04/28/2020 11:42:17 Prescription 04/28/2020 -------------------------------------------------------------------------------- Madie Reno MD Patient  Name: Provider: 03-Oct-1960 [id number] Date of Birth: NPI#[name] Sex: DEA #: [phone] Phone #: License #: Eligha Bridegroom [facility] Patient Address: [address] [place], [place] [address] [address] Floor [place], [place] [address] [phone] Allergies adhesive tape Provider's Orders X-ray, foot right complete view - ICD10: L97.512 - worsening nonhealing ulcer right lateral foot Hand Signature: Date(s): Electronic Signature(s) Signed: 04/28/2020 12:44:20 PM By: [name] Najjar MD Signed: 04/28/2020 5:01:53 PM By: [name] Deed RN, BSN Entered By: [name] on 04/28/2020 11:42:19 -------------------------------------------------------------------------------- Problem List Details Patient Name: Date of Service: [name] 04/28/2020 9:45 A M Medical Record Number: [id number] Patient Account Number: [id number] Date of Birth/Sex: Treating RN: Oct 22, 1960 (59 y.o. [name] Primary Care Provider: [name] Other Clinician: Referring Provider: Treating Provider/Extender: [name] Berthold, [name] Weeks in Treatment: 5 Active Problems ICD-10 Encounter Code Description Active Date MDM Diagnosis I89.0 Lymphedema, not elsewhere classified 03/23/2020 No Yes T81.31XD Disruption of external operation (surgical) wound, not elsewhere classified, 03/23/2020 No Yes subsequent encounter S90.811D Abrasion, right foot, subsequent encounter 03/23/2020 No Yes L97.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 layer exposed 03/23/2020 No Yes L97.312 Non-pressure chronic ulcer of right 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 03/23/2020 No Yes L97.322 Non-pressure chronic ulcer of left 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 03/23/2020 No Yes Inactive Problems ICD-10 Code Description Active Date Inactive Date L03.115 Cellulitis of right lower limb 04/07/2020 04/07/2020 Resolved Problems Electronic  Signature(s) Signed: 04/28/2020 12:44:20 PM By: [name] Najjar MD Entered By: [name] on 04/28/2020 12:19:46 -------------------------------------------------------------------------------- Progress Note Details Patient Name: Date of Service: [name] Rhine 04/28/2020 9:45 A M Medical Record Number: [id number] Patient Account Number: [id number] Date of Birth/Sex: Treating RN: September 22, 1960 (59 y.o. [name] Primary Care Provider: [name] Other Clinician: Referring Provider: Treating Provider/Extender: [name] Berthold, [name] Weeks in Treatment: 5 Subjective History of Present Illness (HPI) 11/23/15; this is a patient I remember from a previous stay in this clinic in 2012. The patient says this was for a wound in this same area as currently although I have no information to verify that. Most of the information is provided by his caregiver from the group home where he resides. Apparently he has had a wound in this area for 8 months. He also has erythema around this area and has had multiple rounds of antibiotics apparently with some improvement but the erythema is persists. He apparently had a fracture in the ankle 2 months ago and was seen by Dr. Lajoyce Corners . He apparently had a splint applied and more recently a heel offloading boot. He had home health coming out to a week ago not clear what they are dressing this with but they apparently discharged him perhaps because of one of Dr. 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 He has a Nurse, adult for transferring at home he is nonambulatory and does not wear footwear. He is not a diabetic. Looking through [place] [facility] he appears to have fairly active primary care. The area on the ankle is variably labeled as a pressure area and/or chronic venous insufficiency. He had a recent venous ultrasound  Record Number: [id number] Patient Account Number: [id number] Date of Birth/Sex: Treating RN: [birth year] (59 y.o. 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 Bathing/ Shower/ Hygiene May shower with protection but do not get wound dressing(s) wet. Edema Control - Lymphedema / SCD / Other Bilateral Lower Extremities Elevate legs to the level of the heart or above for 30 minutes daily and/or when sitting, a frequency of: Off-Loading Heel suspension boot to: - heel protectors at all times Home Health No change in wound care orders this week; continue Home Health for wound care. May utilize formulary equivalent dressing for wound treatment orders unless otherwise specified. Other Home Health Orders/Instructions: - [facility] Wound #40 - Lower Leg Wound Laterality: Left, Medial Cleanser: Wound Cleanser (Home Health) 2 x Per Week/30 Days Discharge Instructions: Cleanse the wound with wound cleanser prior to applying a clean dressing using gauze sponges, not tissue or cotton balls. Peri-Wound Care: Sween Lotion (Moisturizing lotion) (Home Health) 2 x Per Week/30 Days Discharge Instructions: Apply moisturizing lotion with dressing changes Prim Dressing: Hydrofera Blue Classic Foam, 4x4 in (Home Health) 2 x Per Week/30 Days ary Discharge Instructions: Apply to wound bed as instructed Secondary Dressing: ABD Pad, 5x9 (Home Health) 2 x Per Week/30 Days Discharge Instructions: Apply over primary dressing as directed. Compression Wrap: Kerlix Roll 4.5x3.1 (in/yd) (Home Health) 2 x Per Week/30 Days Discharge Instructions: Apply Kerlix and Coban compression as directed. Compression Wrap: Coban Self-Adherent Wrap 4x5 (in/yd) (Home Health) 2 x Per Week/30 Days Discharge Instructions: Apply over Kerlix as directed. Wound #41 - T Fourth oe Wound Laterality: Right Cleanser: Wound Cleanser (Home Health) 2 x Per Week/30 Days Discharge Instructions: Cleanse the wound with wound cleanser prior to applying a clean dressing using gauze sponges, not tissue or cotton balls. Peri-Wound Care:  Sween Lotion (Moisturizing lotion) 2 x Per Week/30 Days Discharge Instructions: Apply moisturizing lotion with dressing changes Prim Dressing: IODOFLEX 0.9% Cadexomer Iodine Pad 4x6 cm (Home Health) 2 x Per Week/30 Days ary Discharge Instructions: Apply to wound bed as instructed Secondary Dressing: Woven Gauze Sponge, Non-Sterile 4x4 in (Home Health) 2 x Per Week/30 Days Discharge Instructions: Apply over primary dressing as directed. Secondary Dressing: ABD Pad, 5x9 (Home Health) 2 x Per Week/30 Days Discharge Instructions: Apply over primary dressing as directed. Compression Wrap: Kerlix Roll 4.5x3.1 (in/yd) (Home Health) 2 x Per Week/30 Days Discharge Instructions: Apply Kerlix and Coban compression as directed. Compression Wrap: Coban Self-Adherent Wrap 4x5 (in/yd) (Home Health) 2 x Per Week/30 Days Discharge Instructions: Apply over Kerlix as directed. Wound #42 - Malleolus Wound Laterality: Right, Medial Cleanser: Wound Cleanser (Home Health) 2 x Per Week/30 Days Discharge Instructions: Cleanse the wound with wound cleanser prior to applying a clean dressing using gauze sponges, not tissue or cotton balls. Peri-Wound Care: Sween Lotion (Moisturizing lotion) 2 x Per Week/30 Days Discharge Instructions: Apply moisturizing lotion with dressing changes Prim Dressing: IODOFLEX 0.9% Cadexomer Iodine Pad

## 2020-05-01 NOTE — Progress Notes (Signed)
[name], [name] Primary Care Jashanti Clinkscale: [name] Other Clinician: Referring Dmauri Rosenow: Treating Zonie Crutcher/Extender: [name], [name] in Treatment: 5 Wound Status Wound Number: 42 Primary Venous Leg Ulcer Etiology: Wound Location: Right, Medial Malleolus Wound Open Wounding Event: Gradually Appeared Status: Date Acquired: 02/21/2020 Comorbid Anemia, Deep Vein Thrombosis, Colitis, Osteomyelitis, Weeks Of Treatment: 5 History: Neuropathy, Quadriplegia, Confinement Anxiety Clustered Wound: Yes Photos Photo Uploaded By: Benjaman Kindler on 05/01/2020 12:06:06 Wound Measurements Length: (cm) 1 Width: (cm) 3.5 Depth: (cm) 0.1 Clustered Quantity: 2 Area: (cm) 2.749 Volume: (cm) 0.275 % Reduction in Area: 45.3% % Reduction in Volume: 72.6% Epithelialization: Small (1-33%) Tunneling: No Undermining: No Wound Description Classification: Full Thickness Without Exposed Support Structures Wound Margin: Flat and Intact Exudate Amount: Medium Exudate Type: Serosanguineous Exudate Color: red, brown Foul Odor After Cleansing: No Slough/Fibrino Yes Wound Bed Granulation Amount: Medium (34-66%) Exposed Structure Granulation Quality: Pink, Friable Fascia Exposed: No Necrotic Amount: Medium (34-66%) Fat Layer (Subcutaneous Tissue) Exposed: Yes Necrotic Quality: Adherent Slough Tendon Exposed: No Muscle Exposed: No Joint Exposed: No Bone Exposed: No Treatment Notes Wound #42 (Malleolus) Wound Laterality: Right, Medial Cleanser Wound Cleanser Discharge Instruction: Cleanse the wound with wound cleanser prior to applying a clean dressing using gauze  sponges, not tissue or cotton balls. Peri-Wound Care Sween Lotion (Moisturizing lotion) Discharge Instruction: Apply moisturizing lotion with dressing changes Topical Primary Dressing IODOFLEX 0.9% Cadexomer Iodine Pad 4x6 cm Discharge Instruction: Apply to wound bed as instructed Secondary Dressing Woven Gauze Sponge, Non-Sterile 4x4 in Discharge Instruction: Apply over primary dressing as directed. ABD Pad, 5x9 Discharge Instruction: Apply over primary dressing as directed. Secured With Compression Wrap Kerlix Roll 4.5x3.1 (in/yd) Discharge Instruction: Apply Kerlix and Coban compression as directed. Coban Self-Adherent Wrap 4x5 (in/yd) Discharge Instruction: Apply over Kerlix as directed. Compression Stockings Add-Ons Electronic Signature(s) Signed: 05/01/2020 5:19:12 PM By: [name] RN Entered By: [name] on 04/28/2020 10:38:04 -------------------------------------------------------------------------------- Wound Assessment Details Patient Name: Date of Service: ALLAH, REASON 04/28/2020 9:45 A M Medical Record Number: [id number] Patient Account Number: [id number] Date of Birth/Sex: Treating RN: [birth year] (59 y.o. [name], [name] Primary Care Domonik Levario: [name] Other Clinician: Referring Darlys Buis: Treating Shunna Mikaelian/Extender: [name], [name] in Treatment: 5 Wound Status Wound Number: 43 Primary Pressure Ulcer Etiology: Wound Location: Right, Distal, Anterior Lower Leg Secondary Venous Leg Ulcer Wounding Event: Pressure Injury Etiology: Date Acquired: 02/21/2020 Wound Open Weeks Of Treatment: 5 Status: Clustered Wound: Yes Comorbid Anemia, Deep Vein Thrombosis, Colitis, Osteomyelitis, History: Neuropathy, Quadriplegia, Confinement Anxiety Photos Photo Uploaded By: Benjaman Kindler on 05/01/2020 12:17:23 Wound Measurements Length: (cm) 1.7 Width: (cm) 0.8 Depth: (cm) 0.1 Clustered Quantity: 2 Area: (cm)  1.068 Volume: (cm) 0.107 % Reduction in Area: 22.3% % Reduction in Volume: 61.1% Epithelialization: Small (1-33%) Tunneling: No Undermining: No Wound Description Classification: Category/Stage III Wound Margin: Flat and Intact Exudate Amount: Small Exudate Type: Serosanguineous Exudate Color: red, brown Foul Odor After Cleansing: No Slough/Fibrino Yes Wound Bed Granulation Amount: Small (1-33%) Exposed Structure Granulation Quality: Red Fascia Exposed: No Necrotic Amount: Large (67-100%) Fat Layer (Subcutaneous Tissue) Exposed: Yes Necrotic Quality: Eschar, Adherent Slough Tendon Exposed: No Muscle Exposed: No Joint Exposed: No Bone Exposed: No Treatment Notes Wound #43 (Lower Leg) Wound Laterality: Right, Anterior, Distal Cleanser Wound Cleanser Discharge Instruction: Cleanse the wound with wound cleanser prior to applying a clean dressing using gauze sponges, not tissue or cotton balls. Peri-Wound Care Sween Lotion (Moisturizing lotion) Discharge Instruction: Apply moisturizing lotion with dressing changes Topical Primary Dressing IODOFLEX  Area: 49% % Reduction in Volume: 49% Epithelialization: Small (1-33%) Tunneling: No Undermining: No Wound Description Classification: Full Thickness Without Exposed Support Structures Wound Margin: Flat and Intact Exudate Amount: Large Exudate Type: Serosanguineous Exudate Color: red, brown Foul Odor After Cleansing: No Slough/Fibrino Yes Wound Bed Granulation Amount: Medium (34-66%) Exposed Structure Granulation Quality: Red, Friable Fascia Exposed: No Necrotic Amount: Medium (34-66%) Fat Layer (Subcutaneous Tissue) Exposed:  Yes Necrotic Quality: Adherent Slough Tendon Exposed: No Muscle Exposed: No Joint Exposed: No Bone Exposed: No Treatment Notes Wound #40 (Lower Leg) Wound Laterality: Left, Medial Cleanser Wound Cleanser Discharge Instruction: Cleanse the wound with wound cleanser prior to applying a clean dressing using gauze sponges, not tissue or cotton balls. Peri-Wound Care Sween Lotion (Moisturizing lotion) Discharge Instruction: Apply moisturizing lotion with dressing changes Topical Primary Dressing Hydrofera Blue Classic Foam, 4x4 in Discharge Instruction: Apply to wound bed as instructed Secondary Dressing ABD Pad, 5x9 Discharge Instruction: Apply over primary dressing as directed. Secured With Compression Wrap Kerlix Roll 4.5x3.1 (in/yd) Discharge Instruction: Apply Kerlix and Coban compression as directed. Coban Self-Adherent Wrap 4x5 (in/yd) Discharge Instruction: Apply over Kerlix as directed. Compression Stockings Add-Ons Electronic Signature(s) Signed: 05/01/2020 5:19:12 PM By: [name] RN Entered By: [name] on 04/28/2020 10:35:45 -------------------------------------------------------------------------------- Wound Assessment Details Patient Name: Date of Service: [name], GAYDEN 04/28/2020 9:45 A M Medical Record Number: [id number] Patient Account Number: [id number] Date of Birth/Sex: Treating RN: July 28, 1960 (59 y.o. [name], [name] Primary Care Artis Beggs: [name] Other Clinician: Referring Chi Woodham: Treating Evalie Hargraves/Extender: [name], [name] in Treatment: 5 Wound Status Wound Number: 41 Primary Lymphedema Etiology: Wound Location: Right T Fourth oe Wound Open Wounding Event: Trauma Status: Date Acquired: 02/21/2020 Comorbid Anemia, Deep Vein Thrombosis, Colitis, Osteomyelitis, Weeks Of Treatment: 5 History: Neuropathy, Quadriplegia, Confinement Anxiety Clustered Wound: No Photos Photo Uploaded By:  Benjaman Kindler on 05/01/2020 12:06:19 Wound Measurements Length: (cm) 1.5 Width: (cm) 1.2 Depth: (cm) 0.1 Area: (cm) 1.414 Volume: (cm) 0.141 % Reduction in Area: 0% % Reduction in Volume: 0% Epithelialization: None Tunneling: No Undermining: No Wound Description Classification: Full Thickness Without Exposed Support Structures Wound Margin: Flat and Intact Exudate Amount: Small Exudate Type: Serosanguineous Exudate Color: red, brown Foul Odor After Cleansing: No Slough/Fibrino Yes Wound Bed Granulation Amount: Medium (34-66%) Exposed Structure Granulation Quality: Pink Fascia Exposed: No Necrotic Amount: Medium (34-66%) Fat Layer (Subcutaneous Tissue) Exposed: Yes Necrotic Quality: Adherent Slough Tendon Exposed: No Muscle Exposed: No Joint Exposed: No Bone Exposed: No Treatment Notes Wound #41 (Toe Fourth) Wound Laterality: Right Cleanser Wound Cleanser Discharge Instruction: Cleanse the wound with wound cleanser prior to applying a clean dressing using gauze sponges, not tissue or cotton balls. Peri-Wound Care Sween Lotion (Moisturizing lotion) Discharge Instruction: Apply moisturizing lotion with dressing changes Topical Primary Dressing IODOFLEX 0.9% Cadexomer Iodine Pad 4x6 cm Discharge Instruction: Apply to wound bed as instructed Secondary Dressing Woven Gauze Sponge, Non-Sterile 4x4 in Discharge Instruction: Apply over primary dressing as directed. ABD Pad, 5x9 Discharge Instruction: Apply over primary dressing as directed. Secured With Compression Wrap Kerlix Roll 4.5x3.1 (in/yd) Discharge Instruction: Apply Kerlix and Coban compression as directed. Coban Self-Adherent Wrap 4x5 (in/yd) Discharge Instruction: Apply over Kerlix as directed. Compression Stockings Add-Ons Electronic Signature(s) Signed: 05/01/2020 5:19:12 PM By: [name] RN Entered By: [name] on 04/28/2020  10:33:48 -------------------------------------------------------------------------------- Wound Assessment Details Patient Name: Date of Service: CROSS, JORGE 04/28/2020 9:45 A M Medical Record Number: [id number] Patient Account Number: [id number] Date of Birth/Sex: Treating RN: 01-06-1961 (59 y.o.  BORDEN, THUNE ([id number]) Visit Report for 04/28/2020 Arrival Information Details Patient Name: Date of Service: RUDOLFO, BRANDOW 04/28/2020 9:45 A M Medical Record Number: [id number] Patient Account Number: [id number] Date of Birth/Sex: Treating RN: 05-26-1961 (58 y.o. [name], [name] Primary Care Anita Laguna: [name] Other Clinician: Referring Rosaleen Mazer: Treating Theia Dezeeuw/Extender: [name], [name] in Treatment: 5 Visit Information History Since Last Visit Added or deleted any medications: No Patient Arrived: Wheel Chair Any new allergies or adverse reactions: No Arrival Time: 10:18 Had a fall or experienced change in No Accompanied By: self activities of daily living that may affect Transfer Assistance: None risk of falls: Patient Identification Verified: Yes Signs or symptoms of abuse/neglect since last visito No Secondary Verification Process Completed: Yes Hospitalized since last visit: No Patient Requires Transmission-Based Precautions: No Implantable device outside of the clinic excluding No Patient Has Alerts: No cellular tissue based products placed in the center since last visit: Has Dressing in Place as Prescribed: Yes Pain Present Now: No Electronic Signature(s) Signed: 05/01/2020 5:19:12 PM By: [name] RN Entered By: [name] on 04/28/2020 10:19:13 -------------------------------------------------------------------------------- Clinic Level of Care Assessment Details Patient Name: Date of Service: TOREY, REINARD 04/28/2020 9:45 A M Medical Record Number: [id number] Patient Account Number: [id number] Date of Birth/Sex: Treating RN: March 13, 1961 (59 y.o. [name] Primary Care Reizel Calzada: [name] Other Clinician: Referring Mariavictoria Nottingham: Treating Idrees Quam/Extender: [name], [name] in Treatment: 5 Clinic Level of Care Assessment Items TOOL 4 Quantity Score []  - 0 Use when  only an EandM is performed on FOLLOW-UP visit ASSESSMENTS - Nursing Assessment / Reassessment X- 1 10 Reassessment of Co-morbidities (includes updates in patient status) X- 1 5 Reassessment of Adherence to Treatment Plan ASSESSMENTS - Wound and Skin A ssessment / Reassessment []  - 0 Simple Wound Assessment / Reassessment - one wound X- 6 5 Complex Wound Assessment / Reassessment - multiple wounds []  - 0 Dermatologic / Skin Assessment (not related to wound area) ASSESSMENTS - Focused Assessment X- 2 5 Circumferential Edema Measurements - multi extremities []  - 0 Nutritional Assessment / Counseling / Intervention X- 1 5 Lower Extremity Assessment (monofilament, tuning fork, pulses) []  - 0 Peripheral Arterial Disease Assessment (using hand held doppler) ASSESSMENTS - Ostomy and/or Continence Assessment and Care []  - 0 Incontinence Assessment and Management []  - 0 Ostomy Care Assessment and Management (repouching, etc.) PROCESS - Coordination of Care X - Simple Patient / Family Education for ongoing care 1 15 []  - 0 Complex (extensive) Patient / Family Education for ongoing care X- 1 10 Staff obtains Consents, Records, T Results / Process Orders est X- 1 10 Staff telephones HHA, Nursing Homes / Clarify orders / etc []  - 0 Routine Transfer to another Facility (non-emergent condition) []  - 0 Routine Hospital Admission (non-emergent condition) []  - 0 New Admissions / / Ordering NPWT Apligraf, etc. , []  - 0 Emergency Hospital Admission (emergent condition) X- 1 10 Simple Discharge Coordination []  - 0 Complex (extensive) Discharge Coordination PROCESS - Special Needs []  - 0 Pediatric / Minor Patient Management []  - 0 Isolation Patient Management []  - 0 Hearing / Language / Visual special needs []  - 0 Assessment of Community assistance (transportation, D/C planning, etc.) []  - 0 Additional assistance / Altered mentation []  - 0 Support  Surface(s) Assessment (bed, cushion, seat, etc.) INTERVENTIONS - Wound Cleansing / Measurement []  - 0 Simple Wound Cleansing - one wound X- 6 5 Complex Wound Cleansing - multiple wounds X- 1  Bonita Quin RN, BSN Entered By: Baltazar Najjar on 04/28/2020 12:22:18 -------------------------------------------------------------------------------- Multi-Disciplinary Care Plan Details Patient Name: Date of Service: LANDRUM, CARBONELL 04/28/2020 9:45 A M Medical Record Number: [id number] Patient Account Number: [id number] Date of Birth/Sex: Treating RN: 1960-06-15 (59 y.o. [name] Primary Care Ayaana Biondo: [name] Other Clinician: Referring Arthelia Callicott: Treating Kaiana Marion/Extender: [name], [name] in Treatment: 5 Active Inactive Nutrition Nursing Diagnoses: Potential for alteratiion in Nutrition/Potential for imbalanced nutrition Goals: Patient/caregiver agrees to and verbalizes understanding of need to obtain nutritional consultation Date Initiated: 03/23/2020 Target Resolution Date: 05/12/2020 Goal Status: Active Interventions: Provide education on nutrition Treatment Activities: Education provided on Nutrition : 04/13/2020 Patient referred to Primary Care Physician for further nutritional evaluation : 03/23/2020 Notes: Wound/Skin Impairment Nursing Diagnoses: Knowledge deficit related to ulceration/compromised skin integrity Goals: Patient/caregiver will verbalize understanding of skin care regimen Date Initiated: 03/23/2020 Target Resolution Date: 06/22/2020 Goal Status: Active Ulcer/skin breakdown will have a volume reduction of 30% by week 4 Date Initiated: 03/31/2020 Date Inactivated: 04/28/2020 Target Resolution Date: 04/21/2020 Goal Status: Unmet Unmet Reason: infection, Ulcer/skin breakdown will have a volume reduction of 50% by week 8 Date Initiated: 04/28/2020 Target  Resolution Date: 05/19/2020 Goal Status: Active Interventions: Assess patient/caregiver ability to perform ulcer/skin care regimen upon admission and as needed Provide education on ulcer and skin care Treatment Activities: Skin care regimen initiated : 03/23/2020 Topical wound management initiated : 03/23/2020 Notes: Electronic Signature(s) Signed: 04/28/2020 5:01:53 PM By: Zenaida Deed RN, BSN Entered By: Zenaida Deed on 04/28/2020 11:26:43 -------------------------------------------------------------------------------- Pain Assessment Details Patient Name: Date of Service: [name], [name] 04/28/2020 9:45 A M Medical Record Number: [id number] Patient Account Number: [id number] Date of Birth/Sex: Treating RN: 1960/09/25 (59 y.o. [name] Primary Care Clifton Safley: [name] Other Clinician: Referring Alicia Seib: Treating Krishna Heuer/Extender: [name], [name] in Treatment: 5 Active Problems Location of Pain Severity and Description of Pain Patient Has Paino No Site Locations Rate the pain. Current Pain Level: 0 Pain Management and Medication Current Pain Management: Electronic Signature(s) Signed: 05/01/2020 5:19:12 PM By: [name] RN Entered By: [name] on 04/28/2020 10:20:06 -------------------------------------------------------------------------------- Patient/Caregiver Education Details Patient Name: Date of Service: [name] 12/3/2021andnbsp9:45 A M Medical Record Number: [id number] Patient Account Number: [id number] Date of Birth/Gender: Treating RN: 09-26-1960 (59 y.o. [name] Primary Care Physician: [name] Other Clinician: Referring Physician: Treating Physician/Extender: Lamarr Lulas in Treatment: 5 Education Assessment Education Provided To: Patient Education Topics Provided Pressure: Methods: Explain/Verbal Responses: Reinforcements needed,  State content correctly Venous: Methods: Explain/Verbal Responses: Reinforcements needed, State content correctly Wound/Skin Impairment: Methods: Explain/Verbal Responses: Reinforcements needed, State content correctly Electronic Signature(s) Signed: 04/28/2020 5:01:53 PM By: Zenaida Deed RN, BSN Entered By: Zenaida Deed on 04/28/2020 11:28:20 -------------------------------------------------------------------------------- Wound Assessment Details Patient Name: Date of Service: HILMER, ALIBERTI 04/28/2020 9:45 A M Medical Record Number: [id number] Patient Account Number: [id number] Date of Birth/Sex: Treating RN: Oct 18, 1960 (59 y.o. [name], [name] Primary Care Jiselle Sheu: [name] Other Clinician: Referring Helen Cuff: Treating Mariyana Fulop/Extender: [name], [name] in Treatment: 5 Wound Status Wound Number: 40 Primary Venous Leg Ulcer Etiology: Wound Location: Left, Medial Lower Leg Wound Open Wounding Event: Gradually Appeared Status: Date Acquired: 02/21/2020 Comorbid Anemia, Deep Vein Thrombosis, Colitis, Osteomyelitis, Weeks Of Treatment: 5 History: Neuropathy, Quadriplegia, Confinement Anxiety Clustered Wound: No Photos Photo Uploaded By: Benjaman Kindler on 05/01/2020 12:17:11 Wound Measurements Length: (cm) 3.5 Width: (cm) 4 Depth: (cm) 0.1 Area: (cm) 10.996 Volume: (cm) 1.1 % Reduction in  BORDEN, THUNE ([id number]) Visit Report for 04/28/2020 Arrival Information Details Patient Name: Date of Service: RUDOLFO, BRANDOW 04/28/2020 9:45 A M Medical Record Number: [id number] Patient Account Number: [id number] Date of Birth/Sex: Treating RN: 05-26-1961 (58 y.o. [name], [name] Primary Care Anita Laguna: [name] Other Clinician: Referring Rosaleen Mazer: Treating Theia Dezeeuw/Extender: [name], [name] in Treatment: 5 Visit Information History Since Last Visit Added or deleted any medications: No Patient Arrived: Wheel Chair Any new allergies or adverse reactions: No Arrival Time: 10:18 Had a fall or experienced change in No Accompanied By: self activities of daily living that may affect Transfer Assistance: None risk of falls: Patient Identification Verified: Yes Signs or symptoms of abuse/neglect since last visito No Secondary Verification Process Completed: Yes Hospitalized since last visit: No Patient Requires Transmission-Based Precautions: No Implantable device outside of the clinic excluding No Patient Has Alerts: No cellular tissue based products placed in the center since last visit: Has Dressing in Place as Prescribed: Yes Pain Present Now: No Electronic Signature(s) Signed: 05/01/2020 5:19:12 PM By: [name] RN Entered By: [name] on 04/28/2020 10:19:13 -------------------------------------------------------------------------------- Clinic Level of Care Assessment Details Patient Name: Date of Service: TOREY, REINARD 04/28/2020 9:45 A M Medical Record Number: [id number] Patient Account Number: [id number] Date of Birth/Sex: Treating RN: March 13, 1961 (59 y.o. [name] Primary Care Reizel Calzada: [name] Other Clinician: Referring Mariavictoria Nottingham: Treating Idrees Quam/Extender: [name], [name] in Treatment: 5 Clinic Level of Care Assessment Items TOOL 4 Quantity Score []  - 0 Use when  only an EandM is performed on FOLLOW-UP visit ASSESSMENTS - Nursing Assessment / Reassessment X- 1 10 Reassessment of Co-morbidities (includes updates in patient status) X- 1 5 Reassessment of Adherence to Treatment Plan ASSESSMENTS - Wound and Skin A ssessment / Reassessment []  - 0 Simple Wound Assessment / Reassessment - one wound X- 6 5 Complex Wound Assessment / Reassessment - multiple wounds []  - 0 Dermatologic / Skin Assessment (not related to wound area) ASSESSMENTS - Focused Assessment X- 2 5 Circumferential Edema Measurements - multi extremities []  - 0 Nutritional Assessment / Counseling / Intervention X- 1 5 Lower Extremity Assessment (monofilament, tuning fork, pulses) []  - 0 Peripheral Arterial Disease Assessment (using hand held doppler) ASSESSMENTS - Ostomy and/or Continence Assessment and Care []  - 0 Incontinence Assessment and Management []  - 0 Ostomy Care Assessment and Management (repouching, etc.) PROCESS - Coordination of Care X - Simple Patient / Family Education for ongoing care 1 15 []  - 0 Complex (extensive) Patient / Family Education for ongoing care X- 1 10 Staff obtains Consents, Records, T Results / Process Orders est X- 1 10 Staff telephones HHA, Nursing Homes / Clarify orders / etc []  - 0 Routine Transfer to another Facility (non-emergent condition) []  - 0 Routine Hospital Admission (non-emergent condition) []  - 0 New Admissions / / Ordering NPWT Apligraf, etc. , []  - 0 Emergency Hospital Admission (emergent condition) X- 1 10 Simple Discharge Coordination []  - 0 Complex (extensive) Discharge Coordination PROCESS - Special Needs []  - 0 Pediatric / Minor Patient Management []  - 0 Isolation Patient Management []  - 0 Hearing / Language / Visual special needs []  - 0 Assessment of Community assistance (transportation, D/C planning, etc.) []  - 0 Additional assistance / Altered mentation []  - 0 Support  Surface(s) Assessment (bed, cushion, seat, etc.) INTERVENTIONS - Wound Cleansing / Measurement []  - 0 Simple Wound Cleansing - one wound X- 6 5 Complex Wound Cleansing - multiple wounds X- 1  [name], [name] Primary Care Jashanti Clinkscale: [name] Other Clinician: Referring Dmauri Rosenow: Treating Zonie Crutcher/Extender: [name], [name] in Treatment: 5 Wound Status Wound Number: 42 Primary Venous Leg Ulcer Etiology: Wound Location: Right, Medial Malleolus Wound Open Wounding Event: Gradually Appeared Status: Date Acquired: 02/21/2020 Comorbid Anemia, Deep Vein Thrombosis, Colitis, Osteomyelitis, Weeks Of Treatment: 5 History: Neuropathy, Quadriplegia, Confinement Anxiety Clustered Wound: Yes Photos Photo Uploaded By: Benjaman Kindler on 05/01/2020 12:06:06 Wound Measurements Length: (cm) 1 Width: (cm) 3.5 Depth: (cm) 0.1 Clustered Quantity: 2 Area: (cm) 2.749 Volume: (cm) 0.275 % Reduction in Area: 45.3% % Reduction in Volume: 72.6% Epithelialization: Small (1-33%) Tunneling: No Undermining: No Wound Description Classification: Full Thickness Without Exposed Support Structures Wound Margin: Flat and Intact Exudate Amount: Medium Exudate Type: Serosanguineous Exudate Color: red, brown Foul Odor After Cleansing: No Slough/Fibrino Yes Wound Bed Granulation Amount: Medium (34-66%) Exposed Structure Granulation Quality: Pink, Friable Fascia Exposed: No Necrotic Amount: Medium (34-66%) Fat Layer (Subcutaneous Tissue) Exposed: Yes Necrotic Quality: Adherent Slough Tendon Exposed: No Muscle Exposed: No Joint Exposed: No Bone Exposed: No Treatment Notes Wound #42 (Malleolus) Wound Laterality: Right, Medial Cleanser Wound Cleanser Discharge Instruction: Cleanse the wound with wound cleanser prior to applying a clean dressing using gauze  sponges, not tissue or cotton balls. Peri-Wound Care Sween Lotion (Moisturizing lotion) Discharge Instruction: Apply moisturizing lotion with dressing changes Topical Primary Dressing IODOFLEX 0.9% Cadexomer Iodine Pad 4x6 cm Discharge Instruction: Apply to wound bed as instructed Secondary Dressing Woven Gauze Sponge, Non-Sterile 4x4 in Discharge Instruction: Apply over primary dressing as directed. ABD Pad, 5x9 Discharge Instruction: Apply over primary dressing as directed. Secured With Compression Wrap Kerlix Roll 4.5x3.1 (in/yd) Discharge Instruction: Apply Kerlix and Coban compression as directed. Coban Self-Adherent Wrap 4x5 (in/yd) Discharge Instruction: Apply over Kerlix as directed. Compression Stockings Add-Ons Electronic Signature(s) Signed: 05/01/2020 5:19:12 PM By: [name] RN Entered By: [name] on 04/28/2020 10:38:04 -------------------------------------------------------------------------------- Wound Assessment Details Patient Name: Date of Service: ALLAH, REASON 04/28/2020 9:45 A M Medical Record Number: [id number] Patient Account Number: [id number] Date of Birth/Sex: Treating RN: [birth year] (59 y.o. [name], [name] Primary Care Domonik Levario: [name] Other Clinician: Referring Darlys Buis: Treating Shunna Mikaelian/Extender: [name], [name] in Treatment: 5 Wound Status Wound Number: 43 Primary Pressure Ulcer Etiology: Wound Location: Right, Distal, Anterior Lower Leg Secondary Venous Leg Ulcer Wounding Event: Pressure Injury Etiology: Date Acquired: 02/21/2020 Wound Open Weeks Of Treatment: 5 Status: Clustered Wound: Yes Comorbid Anemia, Deep Vein Thrombosis, Colitis, Osteomyelitis, History: Neuropathy, Quadriplegia, Confinement Anxiety Photos Photo Uploaded By: Benjaman Kindler on 05/01/2020 12:17:23 Wound Measurements Length: (cm) 1.7 Width: (cm) 0.8 Depth: (cm) 0.1 Clustered Quantity: 2 Area: (cm)  1.068 Volume: (cm) 0.107 % Reduction in Area: 22.3% % Reduction in Volume: 61.1% Epithelialization: Small (1-33%) Tunneling: No Undermining: No Wound Description Classification: Category/Stage III Wound Margin: Flat and Intact Exudate Amount: Small Exudate Type: Serosanguineous Exudate Color: red, brown Foul Odor After Cleansing: No Slough/Fibrino Yes Wound Bed Granulation Amount: Small (1-33%) Exposed Structure Granulation Quality: Red Fascia Exposed: No Necrotic Amount: Large (67-100%) Fat Layer (Subcutaneous Tissue) Exposed: Yes Necrotic Quality: Eschar, Adherent Slough Tendon Exposed: No Muscle Exposed: No Joint Exposed: No Bone Exposed: No Treatment Notes Wound #43 (Lower Leg) Wound Laterality: Right, Anterior, Distal Cleanser Wound Cleanser Discharge Instruction: Cleanse the wound with wound cleanser prior to applying a clean dressing using gauze sponges, not tissue or cotton balls. Peri-Wound Care Sween Lotion (Moisturizing lotion) Discharge Instruction: Apply moisturizing lotion with dressing changes Topical Primary Dressing IODOFLEX  BORDEN, THUNE ([id number]) Visit Report for 04/28/2020 Arrival Information Details Patient Name: Date of Service: RUDOLFO, BRANDOW 04/28/2020 9:45 A M Medical Record Number: [id number] Patient Account Number: [id number] Date of Birth/Sex: Treating RN: 05-26-1961 (58 y.o. [name], [name] Primary Care Anita Laguna: [name] Other Clinician: Referring Rosaleen Mazer: Treating Theia Dezeeuw/Extender: [name], [name] in Treatment: 5 Visit Information History Since Last Visit Added or deleted any medications: No Patient Arrived: Wheel Chair Any new allergies or adverse reactions: No Arrival Time: 10:18 Had a fall or experienced change in No Accompanied By: self activities of daily living that may affect Transfer Assistance: None risk of falls: Patient Identification Verified: Yes Signs or symptoms of abuse/neglect since last visito No Secondary Verification Process Completed: Yes Hospitalized since last visit: No Patient Requires Transmission-Based Precautions: No Implantable device outside of the clinic excluding No Patient Has Alerts: No cellular tissue based products placed in the center since last visit: Has Dressing in Place as Prescribed: Yes Pain Present Now: No Electronic Signature(s) Signed: 05/01/2020 5:19:12 PM By: [name] RN Entered By: [name] on 04/28/2020 10:19:13 -------------------------------------------------------------------------------- Clinic Level of Care Assessment Details Patient Name: Date of Service: TOREY, REINARD 04/28/2020 9:45 A M Medical Record Number: [id number] Patient Account Number: [id number] Date of Birth/Sex: Treating RN: March 13, 1961 (59 y.o. [name] Primary Care Reizel Calzada: [name] Other Clinician: Referring Mariavictoria Nottingham: Treating Idrees Quam/Extender: [name], [name] in Treatment: 5 Clinic Level of Care Assessment Items TOOL 4 Quantity Score []  - 0 Use when  only an EandM is performed on FOLLOW-UP visit ASSESSMENTS - Nursing Assessment / Reassessment X- 1 10 Reassessment of Co-morbidities (includes updates in patient status) X- 1 5 Reassessment of Adherence to Treatment Plan ASSESSMENTS - Wound and Skin A ssessment / Reassessment []  - 0 Simple Wound Assessment / Reassessment - one wound X- 6 5 Complex Wound Assessment / Reassessment - multiple wounds []  - 0 Dermatologic / Skin Assessment (not related to wound area) ASSESSMENTS - Focused Assessment X- 2 5 Circumferential Edema Measurements - multi extremities []  - 0 Nutritional Assessment / Counseling / Intervention X- 1 5 Lower Extremity Assessment (monofilament, tuning fork, pulses) []  - 0 Peripheral Arterial Disease Assessment (using hand held doppler) ASSESSMENTS - Ostomy and/or Continence Assessment and Care []  - 0 Incontinence Assessment and Management []  - 0 Ostomy Care Assessment and Management (repouching, etc.) PROCESS - Coordination of Care X - Simple Patient / Family Education for ongoing care 1 15 []  - 0 Complex (extensive) Patient / Family Education for ongoing care X- 1 10 Staff obtains Consents, Records, T Results / Process Orders est X- 1 10 Staff telephones HHA, Nursing Homes / Clarify orders / etc []  - 0 Routine Transfer to another Facility (non-emergent condition) []  - 0 Routine Hospital Admission (non-emergent condition) []  - 0 New Admissions / / Ordering NPWT Apligraf, etc. , []  - 0 Emergency Hospital Admission (emergent condition) X- 1 10 Simple Discharge Coordination []  - 0 Complex (extensive) Discharge Coordination PROCESS - Special Needs []  - 0 Pediatric / Minor Patient Management []  - 0 Isolation Patient Management []  - 0 Hearing / Language / Visual special needs []  - 0 Assessment of Community assistance (transportation, D/C planning, etc.) []  - 0 Additional assistance / Altered mentation []  - 0 Support  Surface(s) Assessment (bed, cushion, seat, etc.) INTERVENTIONS - Wound Cleansing / Measurement []  - 0 Simple Wound Cleansing - one wound X- 6 5 Complex Wound Cleansing - multiple wounds X- 1  Bonita Quin RN, BSN Entered By: Baltazar Najjar on 04/28/2020 12:22:18 -------------------------------------------------------------------------------- Multi-Disciplinary Care Plan Details Patient Name: Date of Service: LANDRUM, CARBONELL 04/28/2020 9:45 A M Medical Record Number: [id number] Patient Account Number: [id number] Date of Birth/Sex: Treating RN: 1960-06-15 (59 y.o. [name] Primary Care Ayaana Biondo: [name] Other Clinician: Referring Arthelia Callicott: Treating Kaiana Marion/Extender: [name], [name] in Treatment: 5 Active Inactive Nutrition Nursing Diagnoses: Potential for alteratiion in Nutrition/Potential for imbalanced nutrition Goals: Patient/caregiver agrees to and verbalizes understanding of need to obtain nutritional consultation Date Initiated: 03/23/2020 Target Resolution Date: 05/12/2020 Goal Status: Active Interventions: Provide education on nutrition Treatment Activities: Education provided on Nutrition : 04/13/2020 Patient referred to Primary Care Physician for further nutritional evaluation : 03/23/2020 Notes: Wound/Skin Impairment Nursing Diagnoses: Knowledge deficit related to ulceration/compromised skin integrity Goals: Patient/caregiver will verbalize understanding of skin care regimen Date Initiated: 03/23/2020 Target Resolution Date: 06/22/2020 Goal Status: Active Ulcer/skin breakdown will have a volume reduction of 30% by week 4 Date Initiated: 03/31/2020 Date Inactivated: 04/28/2020 Target Resolution Date: 04/21/2020 Goal Status: Unmet Unmet Reason: infection, Ulcer/skin breakdown will have a volume reduction of 50% by week 8 Date Initiated: 04/28/2020 Target  Resolution Date: 05/19/2020 Goal Status: Active Interventions: Assess patient/caregiver ability to perform ulcer/skin care regimen upon admission and as needed Provide education on ulcer and skin care Treatment Activities: Skin care regimen initiated : 03/23/2020 Topical wound management initiated : 03/23/2020 Notes: Electronic Signature(s) Signed: 04/28/2020 5:01:53 PM By: Zenaida Deed RN, BSN Entered By: Zenaida Deed on 04/28/2020 11:26:43 -------------------------------------------------------------------------------- Pain Assessment Details Patient Name: Date of Service: [name], [name] 04/28/2020 9:45 A M Medical Record Number: [id number] Patient Account Number: [id number] Date of Birth/Sex: Treating RN: 1960/09/25 (59 y.o. [name] Primary Care Clifton Safley: [name] Other Clinician: Referring Alicia Seib: Treating Krishna Heuer/Extender: [name], [name] in Treatment: 5 Active Problems Location of Pain Severity and Description of Pain Patient Has Paino No Site Locations Rate the pain. Current Pain Level: 0 Pain Management and Medication Current Pain Management: Electronic Signature(s) Signed: 05/01/2020 5:19:12 PM By: [name] RN Entered By: [name] on 04/28/2020 10:20:06 -------------------------------------------------------------------------------- Patient/Caregiver Education Details Patient Name: Date of Service: [name] 12/3/2021andnbsp9:45 A M Medical Record Number: [id number] Patient Account Number: [id number] Date of Birth/Gender: Treating RN: 09-26-1960 (59 y.o. [name] Primary Care Physician: [name] Other Clinician: Referring Physician: Treating Physician/Extender: Lamarr Lulas in Treatment: 5 Education Assessment Education Provided To: Patient Education Topics Provided Pressure: Methods: Explain/Verbal Responses: Reinforcements needed,  State content correctly Venous: Methods: Explain/Verbal Responses: Reinforcements needed, State content correctly Wound/Skin Impairment: Methods: Explain/Verbal Responses: Reinforcements needed, State content correctly Electronic Signature(s) Signed: 04/28/2020 5:01:53 PM By: Zenaida Deed RN, BSN Entered By: Zenaida Deed on 04/28/2020 11:28:20 -------------------------------------------------------------------------------- Wound Assessment Details Patient Name: Date of Service: HILMER, ALIBERTI 04/28/2020 9:45 A M Medical Record Number: [id number] Patient Account Number: [id number] Date of Birth/Sex: Treating RN: Oct 18, 1960 (59 y.o. [name], [name] Primary Care Jiselle Sheu: [name] Other Clinician: Referring Helen Cuff: Treating Mariyana Fulop/Extender: [name], [name] in Treatment: 5 Wound Status Wound Number: 40 Primary Venous Leg Ulcer Etiology: Wound Location: Left, Medial Lower Leg Wound Open Wounding Event: Gradually Appeared Status: Date Acquired: 02/21/2020 Comorbid Anemia, Deep Vein Thrombosis, Colitis, Osteomyelitis, Weeks Of Treatment: 5 History: Neuropathy, Quadriplegia, Confinement Anxiety Clustered Wound: No Photos Photo Uploaded By: Benjaman Kindler on 05/01/2020 12:17:11 Wound Measurements Length: (cm) 3.5 Width: (cm) 4 Depth: (cm) 0.1 Area: (cm) 10.996 Volume: (cm) 1.1 % Reduction in  Area: 49% % Reduction in Volume: 49% Epithelialization: Small (1-33%) Tunneling: No Undermining: No Wound Description Classification: Full Thickness Without Exposed Support Structures Wound Margin: Flat and Intact Exudate Amount: Large Exudate Type: Serosanguineous Exudate Color: red, brown Foul Odor After Cleansing: No Slough/Fibrino Yes Wound Bed Granulation Amount: Medium (34-66%) Exposed Structure Granulation Quality: Red, Friable Fascia Exposed: No Necrotic Amount: Medium (34-66%) Fat Layer (Subcutaneous Tissue) Exposed:  Yes Necrotic Quality: Adherent Slough Tendon Exposed: No Muscle Exposed: No Joint Exposed: No Bone Exposed: No Treatment Notes Wound #40 (Lower Leg) Wound Laterality: Left, Medial Cleanser Wound Cleanser Discharge Instruction: Cleanse the wound with wound cleanser prior to applying a clean dressing using gauze sponges, not tissue or cotton balls. Peri-Wound Care Sween Lotion (Moisturizing lotion) Discharge Instruction: Apply moisturizing lotion with dressing changes Topical Primary Dressing Hydrofera Blue Classic Foam, 4x4 in Discharge Instruction: Apply to wound bed as instructed Secondary Dressing ABD Pad, 5x9 Discharge Instruction: Apply over primary dressing as directed. Secured With Compression Wrap Kerlix Roll 4.5x3.1 (in/yd) Discharge Instruction: Apply Kerlix and Coban compression as directed. Coban Self-Adherent Wrap 4x5 (in/yd) Discharge Instruction: Apply over Kerlix as directed. Compression Stockings Add-Ons Electronic Signature(s) Signed: 05/01/2020 5:19:12 PM By: [name] RN Entered By: [name] on 04/28/2020 10:35:45 -------------------------------------------------------------------------------- Wound Assessment Details Patient Name: Date of Service: [name], GAYDEN 04/28/2020 9:45 A M Medical Record Number: [id number] Patient Account Number: [id number] Date of Birth/Sex: Treating RN: July 28, 1960 (59 y.o. [name], [name] Primary Care Artis Beggs: [name] Other Clinician: Referring Chi Woodham: Treating Evalie Hargraves/Extender: [name], [name] in Treatment: 5 Wound Status Wound Number: 41 Primary Lymphedema Etiology: Wound Location: Right T Fourth oe Wound Open Wounding Event: Trauma Status: Date Acquired: 02/21/2020 Comorbid Anemia, Deep Vein Thrombosis, Colitis, Osteomyelitis, Weeks Of Treatment: 5 History: Neuropathy, Quadriplegia, Confinement Anxiety Clustered Wound: No Photos Photo Uploaded By:  Benjaman Kindler on 05/01/2020 12:06:19 Wound Measurements Length: (cm) 1.5 Width: (cm) 1.2 Depth: (cm) 0.1 Area: (cm) 1.414 Volume: (cm) 0.141 % Reduction in Area: 0% % Reduction in Volume: 0% Epithelialization: None Tunneling: No Undermining: No Wound Description Classification: Full Thickness Without Exposed Support Structures Wound Margin: Flat and Intact Exudate Amount: Small Exudate Type: Serosanguineous Exudate Color: red, brown Foul Odor After Cleansing: No Slough/Fibrino Yes Wound Bed Granulation Amount: Medium (34-66%) Exposed Structure Granulation Quality: Pink Fascia Exposed: No Necrotic Amount: Medium (34-66%) Fat Layer (Subcutaneous Tissue) Exposed: Yes Necrotic Quality: Adherent Slough Tendon Exposed: No Muscle Exposed: No Joint Exposed: No Bone Exposed: No Treatment Notes Wound #41 (Toe Fourth) Wound Laterality: Right Cleanser Wound Cleanser Discharge Instruction: Cleanse the wound with wound cleanser prior to applying a clean dressing using gauze sponges, not tissue or cotton balls. Peri-Wound Care Sween Lotion (Moisturizing lotion) Discharge Instruction: Apply moisturizing lotion with dressing changes Topical Primary Dressing IODOFLEX 0.9% Cadexomer Iodine Pad 4x6 cm Discharge Instruction: Apply to wound bed as instructed Secondary Dressing Woven Gauze Sponge, Non-Sterile 4x4 in Discharge Instruction: Apply over primary dressing as directed. ABD Pad, 5x9 Discharge Instruction: Apply over primary dressing as directed. Secured With Compression Wrap Kerlix Roll 4.5x3.1 (in/yd) Discharge Instruction: Apply Kerlix and Coban compression as directed. Coban Self-Adherent Wrap 4x5 (in/yd) Discharge Instruction: Apply over Kerlix as directed. Compression Stockings Add-Ons Electronic Signature(s) Signed: 05/01/2020 5:19:12 PM By: [name] RN Entered By: [name] on 04/28/2020  10:33:48 -------------------------------------------------------------------------------- Wound Assessment Details Patient Name: Date of Service: CROSS, JORGE 04/28/2020 9:45 A M Medical Record Number: [id number] Patient Account Number: [id number] Date of Birth/Sex: Treating RN: 01-06-1961 (59 y.o.

## 2020-05-05 ENCOUNTER — Other Ambulatory Visit: Payer: Self-pay

## 2020-05-05 ENCOUNTER — Encounter (HOSPITAL_BASED_OUTPATIENT_CLINIC_OR_DEPARTMENT_OTHER): Payer: Medicare Other | Admitting: Internal Medicine

## 2020-05-05 DIAGNOSIS — T8131XA Disruption of external operation (surgical) wound, not elsewhere classified, initial encounter: Secondary | ICD-10-CM | POA: Diagnosis not present

## 2020-05-11 NOTE — Progress Notes (Signed)
The patient is in a lot of pain 12/10; the patient comes in with a new deep tissue injury on the right heel large necrotic wound on the right lateral foot, smaller necrotic wound on the dorsal ankle and medial ankle. Necrotic wounds on the second and fourth toes. We have been using Hydrofera Blue On the left he has the area on the left little medial ankle which actually looks some better healthier looking tissue. We are using Hydrofera Blue here as well X-ray ordered last week showed no osseous erosion no plain film indication of osteomyelitis. Electronic Signature(s) Signed: 05/11/2020 8:09:40 AM By: Baltazar Najjar MD Entered By: Baltazar Najjar on 05/05/2020 09:55:51 -------------------------------------------------------------------------------- Physical Exam Details Patient  Name: Date of Service: [name], [name] 05/05/2020 8:45 A M Medical Record Number: [id number] Patient Account Number: [id number] Date of Birth/Sex: Treating RN: 10/06/1960 (59 y.o. [name] Primary Care Provider: [name] Other Clinician: Referring Provider: Treating Provider/Extender: [name], [name] in Treatment: 6 Constitutional Sitting or standing Blood Pressure is within target range for patient.. Pulse regular and within target range for patient.Marland Kitchen Respirations regular, non-labored and within target range.. Temperature is normal and within the target range for the patient.Marland Kitchen Appears in no distress. Notes Wound exam On the left his wound on the left medial ankle actually looks better. Healthy granulation and rims of epithelialization On the right had absolutely nothing looks any better. He has a deep tissue injury on the right heel that looks threatening but not open yet. He has a large necrotic wound on the right lateral foot, smaller areas on the right dorsal and medial ankle and the second and fourth toes. I used a #15 scalpel and pickups to take necrotic debris off the surface of the lateral foot wound and then a 5 curette to obtain a tissue sample for PCR culture Electronic Signature(s) Signed: 05/11/2020 8:09:40 AM By: Baltazar Najjar MD Entered By: Baltazar Najjar on 05/05/2020 09:57:45 -------------------------------------------------------------------------------- Physician Orders Details Patient Name: Date of Service: [name] 05/05/2020 8:45 A M Medical Record Number: [id number] Patient Account Number: [id number] Date of Birth/Sex: Treating RN: 05-Sep-1960 (59 y.o. 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 Bathing/ Shower/ Hygiene May shower with protection but do not get wound dressing(s) wet. Edema Control - Lymphedema / SCD / Other Bilateral Lower Extremities Elevate legs to the level of the heart or above for 30 minutes daily and/or when sitting, a frequency of: Off-Loading Heel suspension boot to: - heel protectors at all times Other: - avoid any pressure to back of heels Home Health No change in wound care orders this week; continue Home Health for wound care. May utilize formulary equivalent dressing for wound treatment orders unless otherwise specified. Other Home Health Orders/Instructions: - [facility] Wound Treatment Wound #40 - Lower Leg Wound Laterality: Left, Medial Cleanser: Wound Cleanser (Home Health) 2 x Per Week/30 Days Discharge Instructions: Cleanse the wound with wound cleanser prior to applying a clean  05/05/2020 5:15:15 PM Version By: Zenaida Deed RN, BSN Entered By: Zenaida Deed on 05/09/2020  10:02:45 -------------------------------------------------------------------------------- Problem List Details Patient Name: Date of Service: [name], [name] 05/05/2020 8:45 A M Medical Record Number: [id number] Patient Account Number: [id number] Date of Birth/Sex: Treating RN: [birth year] (59 y.o. [name] Primary Care Provider: [name] Other Clinician: Referring Provider: Treating Provider/Extender: [name], [name] in Treatment: 6 Active Problems ICD-10 Encounter Code Description Active Date MDM Diagnosis I89.0 Lymphedema, not elsewhere classified 03/23/2020 No Yes T81.31XD Disruption of external operation (surgical) wound, not elsewhere classified, 03/23/2020 No Yes subsequent encounter S90.811D Abrasion, right foot, subsequent encounter 03/23/2020 No Yes L97.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 layer exposed 03/23/2020 No Yes L97.312 Non-pressure chronic ulcer of right 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 03/23/2020 No Yes L97.322 Non-pressure chronic ulcer of left 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 03/23/2020 No Yes L89.616 Pressure-induced deep tissue damage of right heel 05/05/2020 No Yes Inactive Problems ICD-10 Code Description Active Date Inactive Date L03.115 Cellulitis of right lower limb 04/07/2020 04/07/2020 Resolved Problems Electronic Signature(s) Signed: 05/11/2020 8:09:40 AM By: Baltazar Najjar MD Entered By: Baltazar Najjar on 05/05/2020 09:53:26 -------------------------------------------------------------------------------- Progress Note Details Patient Name: Date of Service: [name] 05/05/2020 8:45 A M Medical Record Number: [id number] Patient Account Number: [id number] Date of Birth/Sex: Treating RN: Jun 16, 1960 (59 y.o. [name] Primary Care Provider: [name] Other Clinician: Referring Provider: Treating Provider/Extender: [name],  [name] in Treatment: 6 Subjective History of Present Illness (HPI) 11/23/15; this is a patient I remember from a previous stay in this clinic in 2012. The patient says this was for a wound in this same area as currently although I have no information to verify that. Most of the information is provided by his caregiver from the group home where he resides. Apparently he has had a wound in this area for 8 months. He also has erythema around this area and has had multiple rounds of antibiotics apparently with some improvement but the erythema is persists. He apparently had a fracture in the ankle 2 months ago and was seen by Dr. Lajoyce Corners . He apparently had a splint applied and more recently a heel offloading boot. He had home health coming out to a week ago not clear what they are dressing this with but they apparently discharged him perhaps because of one of Dr. 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 He has a Nurse, adult for transferring at home he is nonambulatory and does not wear footwear. He is not a diabetic. Looking through [facility] he appears to have fairly active primary care. The area on the ankle is variably labeled as a pressure area and/or chronic venous insufficiency. He had a recent venous ultrasound on 5/25 that did not show a DVT in the left leg however this was not a reflux study. There was no superficial DVT either. Arterial studies on 10/15/14 showed a left ABI of 1.16 Center right of 1.12 waveforms were triphasic he does not have significant arterial disease. He has had recent x-rays of the left foot and ankle. The left foot did not show any abnormality. Left ankle x-ray showed a spiral fracture of the left tibial diaphysis without evidence of osteomyelitis. 11/30/15 culture I did last week showed pseudomonas I changed him to oral ciprofloxacin. Erythema is a lot better. 12/07/15 area around the wound shows considerable improvement of  the periwound erythema. 12/14/15;  05/05/2020 5:15:15 PM Version By: Zenaida Deed RN, BSN Entered By: Zenaida Deed on 05/09/2020  10:02:45 -------------------------------------------------------------------------------- Problem List Details Patient Name: Date of Service: [name], [name] 05/05/2020 8:45 A M Medical Record Number: [id number] Patient Account Number: [id number] Date of Birth/Sex: Treating RN: [birth year] (59 y.o. [name] Primary Care Provider: [name] Other Clinician: Referring Provider: Treating Provider/Extender: [name], [name] in Treatment: 6 Active Problems ICD-10 Encounter Code Description Active Date MDM Diagnosis I89.0 Lymphedema, not elsewhere classified 03/23/2020 No Yes T81.31XD Disruption of external operation (surgical) wound, not elsewhere classified, 03/23/2020 No Yes subsequent encounter S90.811D Abrasion, right foot, subsequent encounter 03/23/2020 No Yes L97.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 layer exposed 03/23/2020 No Yes L97.312 Non-pressure chronic ulcer of right 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 03/23/2020 No Yes L97.322 Non-pressure chronic ulcer of left 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 03/23/2020 No Yes L89.616 Pressure-induced deep tissue damage of right heel 05/05/2020 No Yes Inactive Problems ICD-10 Code Description Active Date Inactive Date L03.115 Cellulitis of right lower limb 04/07/2020 04/07/2020 Resolved Problems Electronic Signature(s) Signed: 05/11/2020 8:09:40 AM By: Baltazar Najjar MD Entered By: Baltazar Najjar on 05/05/2020 09:53:26 -------------------------------------------------------------------------------- Progress Note Details Patient Name: Date of Service: [name] 05/05/2020 8:45 A M Medical Record Number: [id number] Patient Account Number: [id number] Date of Birth/Sex: Treating RN: Jun 16, 1960 (59 y.o. [name] Primary Care Provider: [name] Other Clinician: Referring Provider: Treating Provider/Extender: [name],  [name] in Treatment: 6 Subjective History of Present Illness (HPI) 11/23/15; this is a patient I remember from a previous stay in this clinic in 2012. The patient says this was for a wound in this same area as currently although I have no information to verify that. Most of the information is provided by his caregiver from the group home where he resides. Apparently he has had a wound in this area for 8 months. He also has erythema around this area and has had multiple rounds of antibiotics apparently with some improvement but the erythema is persists. He apparently had a fracture in the ankle 2 months ago and was seen by Dr. Lajoyce Corners . He apparently had a splint applied and more recently a heel offloading boot. He had home health coming out to a week ago not clear what they are dressing this with but they apparently discharged him perhaps because of one of Dr. 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 He has a Nurse, adult for transferring at home he is nonambulatory and does not wear footwear. He is not a diabetic. Looking through [facility] he appears to have fairly active primary care. The area on the ankle is variably labeled as a pressure area and/or chronic venous insufficiency. He had a recent venous ultrasound on 5/25 that did not show a DVT in the left leg however this was not a reflux study. There was no superficial DVT either. Arterial studies on 10/15/14 showed a left ABI of 1.16 Center right of 1.12 waveforms were triphasic he does not have significant arterial disease. He has had recent x-rays of the left foot and ankle. The left foot did not show any abnormality. Left ankle x-ray showed a spiral fracture of the left tibial diaphysis without evidence of osteomyelitis. 11/30/15 culture I did last week showed pseudomonas I changed him to oral ciprofloxacin. Erythema is a lot better. 12/07/15 area around the wound shows considerable improvement of  the periwound erythema. 12/14/15;  The patient is in a lot of pain 12/10; the patient comes in with a new deep tissue injury on the right heel large necrotic wound on the right lateral foot, smaller necrotic wound on the dorsal ankle and medial ankle. Necrotic wounds on the second and fourth toes. We have been using Hydrofera Blue On the left he has the area on the left little medial ankle which actually looks some better healthier looking tissue. We are using Hydrofera Blue here as well X-ray ordered last week showed no osseous erosion no plain film indication of osteomyelitis. Electronic Signature(s) Signed: 05/11/2020 8:09:40 AM By: Baltazar Najjar MD Entered By: Baltazar Najjar on 05/05/2020 09:55:51 -------------------------------------------------------------------------------- Physical Exam Details Patient  Name: Date of Service: [name], [name] 05/05/2020 8:45 A M Medical Record Number: [id number] Patient Account Number: [id number] Date of Birth/Sex: Treating RN: 10/06/1960 (59 y.o. [name] Primary Care Provider: [name] Other Clinician: Referring Provider: Treating Provider/Extender: [name], [name] in Treatment: 6 Constitutional Sitting or standing Blood Pressure is within target range for patient.. Pulse regular and within target range for patient.Marland Kitchen Respirations regular, non-labored and within target range.. Temperature is normal and within the target range for the patient.Marland Kitchen Appears in no distress. Notes Wound exam On the left his wound on the left medial ankle actually looks better. Healthy granulation and rims of epithelialization On the right had absolutely nothing looks any better. He has a deep tissue injury on the right heel that looks threatening but not open yet. He has a large necrotic wound on the right lateral foot, smaller areas on the right dorsal and medial ankle and the second and fourth toes. I used a #15 scalpel and pickups to take necrotic debris off the surface of the lateral foot wound and then a 5 curette to obtain a tissue sample for PCR culture Electronic Signature(s) Signed: 05/11/2020 8:09:40 AM By: Baltazar Najjar MD Entered By: Baltazar Najjar on 05/05/2020 09:57:45 -------------------------------------------------------------------------------- Physician Orders Details Patient Name: Date of Service: [name] 05/05/2020 8:45 A M Medical Record Number: [id number] Patient Account Number: [id number] Date of Birth/Sex: Treating RN: 05-Sep-1960 (59 y.o. 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 Bathing/ Shower/ Hygiene May shower with protection but do not get wound dressing(s) wet. Edema Control - Lymphedema / SCD / Other Bilateral Lower Extremities Elevate legs to the level of the heart or above for 30 minutes daily and/or when sitting, a frequency of: Off-Loading Heel suspension boot to: - heel protectors at all times Other: - avoid any pressure to back of heels Home Health No change in wound care orders this week; continue Home Health for wound care. May utilize formulary equivalent dressing for wound treatment orders unless otherwise specified. Other Home Health Orders/Instructions: - [facility] Wound Treatment Wound #40 - Lower Leg Wound Laterality: Left, Medial Cleanser: Wound Cleanser (Home Health) 2 x Per Week/30 Days Discharge Instructions: Cleanse the wound with wound cleanser prior to applying a clean  05/05/2020 5:15:15 PM Version By: Zenaida Deed RN, BSN Entered By: Zenaida Deed on 05/09/2020  10:02:45 -------------------------------------------------------------------------------- Problem List Details Patient Name: Date of Service: [name], [name] 05/05/2020 8:45 A M Medical Record Number: [id number] Patient Account Number: [id number] Date of Birth/Sex: Treating RN: [birth year] (59 y.o. [name] Primary Care Provider: [name] Other Clinician: Referring Provider: Treating Provider/Extender: [name], [name] in Treatment: 6 Active Problems ICD-10 Encounter Code Description Active Date MDM Diagnosis I89.0 Lymphedema, not elsewhere classified 03/23/2020 No Yes T81.31XD Disruption of external operation (surgical) wound, not elsewhere classified, 03/23/2020 No Yes subsequent encounter S90.811D Abrasion, right foot, subsequent encounter 03/23/2020 No Yes L97.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 layer exposed 03/23/2020 No Yes L97.312 Non-pressure chronic ulcer of right 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 03/23/2020 No Yes L97.322 Non-pressure chronic ulcer of left 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 03/23/2020 No Yes L89.616 Pressure-induced deep tissue damage of right heel 05/05/2020 No Yes Inactive Problems ICD-10 Code Description Active Date Inactive Date L03.115 Cellulitis of right lower limb 04/07/2020 04/07/2020 Resolved Problems Electronic Signature(s) Signed: 05/11/2020 8:09:40 AM By: Baltazar Najjar MD Entered By: Baltazar Najjar on 05/05/2020 09:53:26 -------------------------------------------------------------------------------- Progress Note Details Patient Name: Date of Service: [name] 05/05/2020 8:45 A M Medical Record Number: [id number] Patient Account Number: [id number] Date of Birth/Sex: Treating RN: Jun 16, 1960 (59 y.o. [name] Primary Care Provider: [name] Other Clinician: Referring Provider: Treating Provider/Extender: [name],  [name] in Treatment: 6 Subjective History of Present Illness (HPI) 11/23/15; this is a patient I remember from a previous stay in this clinic in 2012. The patient says this was for a wound in this same area as currently although I have no information to verify that. Most of the information is provided by his caregiver from the group home where he resides. Apparently he has had a wound in this area for 8 months. He also has erythema around this area and has had multiple rounds of antibiotics apparently with some improvement but the erythema is persists. He apparently had a fracture in the ankle 2 months ago and was seen by Dr. Lajoyce Corners . He apparently had a splint applied and more recently a heel offloading boot. He had home health coming out to a week ago not clear what they are dressing this with but they apparently discharged him perhaps because of one of Dr. 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 He has a Nurse, adult for transferring at home he is nonambulatory and does not wear footwear. He is not a diabetic. Looking [facility] he appears to have fairly active primary care. The area on the ankle is variably labeled as a pressure area and/or chronic venous insufficiency. He had a recent venous ultrasound on 5/25 that did not show a DVT in the left leg however this was not a reflux study. There was no superficial DVT either. Arterial studies on 10/15/14 showed a left ABI of 1.16 Center right of 1.12 waveforms were triphasic he does not have significant arterial disease. He has had recent x-rays of the left foot and ankle. The left foot did not show any abnormality. Left ankle x-ray showed a spiral fracture of the left tibial diaphysis without evidence of osteomyelitis. 11/30/15 culture I did last week showed pseudomonas I changed him to oral ciprofloxacin. Erythema is a lot better. 12/07/15 area around the wound shows considerable improvement of  the periwound erythema. 12/14/15;  The patient is in a lot of pain 12/10; the patient comes in with a new deep tissue injury on the right heel large necrotic wound on the right lateral foot, smaller necrotic wound on the dorsal ankle and medial ankle. Necrotic wounds on the second and fourth toes. We have been using Hydrofera Blue On the left he has the area on the left little medial ankle which actually looks some better healthier looking tissue. We are using Hydrofera Blue here as well X-ray ordered last week showed no osseous erosion no plain film indication of osteomyelitis. Electronic Signature(s) Signed: 05/11/2020 8:09:40 AM By: Baltazar Najjar MD Entered By: Baltazar Najjar on 05/05/2020 09:55:51 -------------------------------------------------------------------------------- Physical Exam Details Patient  Name: Date of Service: [name], [name] 05/05/2020 8:45 A M Medical Record Number: [id number] Patient Account Number: [id number] Date of Birth/Sex: Treating RN: 10/06/1960 (59 y.o. [name] Primary Care Provider: [name] Other Clinician: Referring Provider: Treating Provider/Extender: [name], [name] in Treatment: 6 Constitutional Sitting or standing Blood Pressure is within target range for patient.. Pulse regular and within target range for patient.Marland Kitchen Respirations regular, non-labored and within target range.. Temperature is normal and within the target range for the patient.Marland Kitchen Appears in no distress. Notes Wound exam On the left his wound on the left medial ankle actually looks better. Healthy granulation and rims of epithelialization On the right had absolutely nothing looks any better. He has a deep tissue injury on the right heel that looks threatening but not open yet. He has a large necrotic wound on the right lateral foot, smaller areas on the right dorsal and medial ankle and the second and fourth toes. I used a #15 scalpel and pickups to take necrotic debris off the surface of the lateral foot wound and then a 5 curette to obtain a tissue sample for PCR culture Electronic Signature(s) Signed: 05/11/2020 8:09:40 AM By: Baltazar Najjar MD Entered By: Baltazar Najjar on 05/05/2020 09:57:45 -------------------------------------------------------------------------------- Physician Orders Details Patient Name: Date of Service: [name] 05/05/2020 8:45 A M Medical Record Number: [id number] Patient Account Number: [id number] Date of Birth/Sex: Treating RN: 05-Sep-1960 (59 y.o. 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 Bathing/ Shower/ Hygiene May shower with protection but do not get wound dressing(s) wet. Edema Control - Lymphedema / SCD / Other Bilateral Lower Extremities Elevate legs to the level of the heart or above for 30 minutes daily and/or when sitting, a frequency of: Off-Loading Heel suspension boot to: - heel protectors at all times Other: - avoid any pressure to back of heels Home Health No change in wound care orders this week; continue Home Health for wound care. May utilize formulary equivalent dressing for wound treatment orders unless otherwise specified. Other Home Health Orders/Instructions: - [facility]ound Treatment Wound #40 - Lower Leg Wound Laterality: Left, Medial Cleanser: Wound Cleanser (Home Health) 2 x Per Week/30 Days Discharge Instructions: Cleanse the wound with wound cleanser prior to applying a clean  05/05/2020 5:15:15 PM Version By: Zenaida Deed RN, BSN Entered By: Zenaida Deed on 05/09/2020  10:02:45 -------------------------------------------------------------------------------- Problem List Details Patient Name: Date of Service: [name], [name] 05/05/2020 8:45 A M Medical Record Number: [id number] Patient Account Number: [id number] Date of Birth/Sex: Treating RN: [birth year] (59 y.o. [name] Primary Care Provider: [name] Other Clinician: Referring Provider: Treating Provider/Extender: [name], [name] in Treatment: 6 Active Problems ICD-10 Encounter Code Description Active Date MDM Diagnosis I89.0 Lymphedema, not elsewhere classified 03/23/2020 No Yes T81.31XD Disruption of external operation (surgical) wound, not elsewhere classified, 03/23/2020 No Yes subsequent encounter S90.811D Abrasion, right foot, subsequent encounter 03/23/2020 No Yes L97.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 layer exposed 03/23/2020 No Yes L97.312 Non-pressure chronic ulcer of right 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 03/23/2020 No Yes L97.322 Non-pressure chronic ulcer of left 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 03/23/2020 No Yes L89.616 Pressure-induced deep tissue damage of right heel 05/05/2020 No Yes Inactive Problems ICD-10 Code Description Active Date Inactive Date L03.115 Cellulitis of right lower limb 04/07/2020 04/07/2020 Resolved Problems Electronic Signature(s) Signed: 05/11/2020 8:09:40 AM By: Baltazar Najjar MD Entered By: Baltazar Najjar on 05/05/2020 09:53:26 -------------------------------------------------------------------------------- Progress Note Details Patient Name: Date of Service: [name] 05/05/2020 8:45 A M Medical Record Number: [id number] Patient Account Number: [id number] Date of Birth/Sex: Treating RN: Jun 16, 1960 (59 y.o. [name] Primary Care Provider: [name] Other Clinician: Referring Provider: Treating Provider/Extender: [name],  [name] in Treatment: 6 Subjective History of Present Illness (HPI) 11/23/15; this is a patient I remember from a previous stay in this clinic in 2012. The patient says this was for a wound in this same area as currently although I have no information to verify that. Most of the information is provided by his caregiver from the group home where he resides. Apparently he has had a wound in this area for 8 months. He also has erythema around this area and has had multiple rounds of antibiotics apparently with some improvement but the erythema is persists. He apparently had a fracture in the ankle 2 months ago and was seen by Dr. Lajoyce Corners . He apparently had a splint applied and more recently a heel offloading boot. He had home health coming out to a week ago not clear what they are dressing this with but they apparently discharged him perhaps because of one of Dr. 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 He has a Nurse, adult for transferring at home he is nonambulatory and does not wear footwear. He is not a diabetic. Looking through [facility] he appears to have fairly active primary care. The area on the ankle is variably labeled as a pressure area and/or chronic venous insufficiency. He had a recent venous ultrasound on 5/25 that did not show a DVT in the left leg however this was not a reflux study. There was no superficial DVT either. Arterial studies on 10/15/14 showed a left ABI of 1.16 Center right of 1.12 waveforms were triphasic he does not have significant arterial disease. He has had recent x-rays of the left foot and ankle. The left foot did not show any abnormality. Left ankle x-ray showed a spiral fracture of the left tibial diaphysis without evidence of osteomyelitis. 11/30/15 culture I did last week showed pseudomonas I changed him to oral ciprofloxacin. Erythema is a lot better. 12/07/15 area around the wound shows considerable improvement of  the periwound erythema. 12/14/15;  05/05/2020 5:15:15 PM Version By: Zenaida Deed RN, BSN Entered By: Zenaida Deed on 05/09/2020  10:02:45 -------------------------------------------------------------------------------- Problem List Details Patient Name: Date of Service: [name], [name] 05/05/2020 8:45 A M Medical Record Number: [id number] Patient Account Number: [id number] Date of Birth/Sex: Treating RN: [birth year] (59 y.o. [name] Primary Care Provider: [name] Other Clinician: Referring Provider: Treating Provider/Extender: [name], [name] in Treatment: 6 Active Problems ICD-10 Encounter Code Description Active Date MDM Diagnosis I89.0 Lymphedema, not elsewhere classified 03/23/2020 No Yes T81.31XD Disruption of external operation (surgical) wound, not elsewhere classified, 03/23/2020 No Yes subsequent encounter S90.811D Abrasion, right foot, subsequent encounter 03/23/2020 No Yes L97.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 layer exposed 03/23/2020 No Yes L97.312 Non-pressure chronic ulcer of right 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 03/23/2020 No Yes L97.322 Non-pressure chronic ulcer of left 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 03/23/2020 No Yes L89.616 Pressure-induced deep tissue damage of right heel 05/05/2020 No Yes Inactive Problems ICD-10 Code Description Active Date Inactive Date L03.115 Cellulitis of right lower limb 04/07/2020 04/07/2020 Resolved Problems Electronic Signature(s) Signed: 05/11/2020 8:09:40 AM By: Baltazar Najjar MD Entered By: Baltazar Najjar on 05/05/2020 09:53:26 -------------------------------------------------------------------------------- Progress Note Details Patient Name: Date of Service: [name] 05/05/2020 8:45 A M Medical Record Number: [id number] Patient Account Number: [id number] Date of Birth/Sex: Treating RN: Jun 16, 1960 (59 y.o. [name] Primary Care Provider: [name] Other Clinician: Referring Provider: Treating Provider/Extender: [name],  [name] in Treatment: 6 Subjective History of Present Illness (HPI) 11/23/15; this is a patient I remember from a previous stay in this clinic in 2012. The patient says this was for a wound in this same area as currently although I have no information to verify that. Most of the information is provided by his caregiver from the group home where he resides. Apparently he has had a wound in this area for 8 months. He also has erythema around this area and has had multiple rounds of antibiotics apparently with some improvement but the erythema is persists. He apparently had a fracture in the ankle 2 months ago and was seen by Dr. Lajoyce Corners . He apparently had a splint applied and more recently a heel offloading boot. He had home health coming out to a week ago not clear what they are dressing this with but they apparently discharged him perhaps because of one of Dr. 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 He has a Nurse, adult for transferring at home he is nonambulatory and does not wear footwear. He is not a diabetic. Looking [facility] he appears to have fairly active primary care. The area on the ankle is variably labeled as a pressure area and/or chronic venous insufficiency. He had a recent venous ultrasound on 5/25 that did not show a DVT in the left leg however this was not a reflux study. There was no superficial DVT either. Arterial studies on 10/15/14 showed a left ABI of 1.16 Center right of 1.12 waveforms were triphasic he does not have significant arterial disease. He has had recent x-rays of the left foot and ankle. The left foot did not show any abnormality. Left ankle x-ray showed a spiral fracture of the left tibial diaphysis without evidence of osteomyelitis. 11/30/15 culture I did last week showed pseudomonas I changed him to oral ciprofloxacin. Erythema is a lot better. 12/07/15 area around the wound shows considerable improvement of  the periwound erythema. 12/14/15;  05/05/2020 5:15:15 PM Version By: Zenaida Deed RN, BSN Entered By: Zenaida Deed on 05/09/2020  10:02:45 -------------------------------------------------------------------------------- Problem List Details Patient Name: Date of Service: [name], [name] 05/05/2020 8:45 A M Medical Record Number: [id number] Patient Account Number: [id number] Date of Birth/Sex: Treating RN: [birth year] (59 y.o. [name] Primary Care Provider: [name] Other Clinician: Referring Provider: Treating Provider/Extender: [name], [name] in Treatment: 6 Active Problems ICD-10 Encounter Code Description Active Date MDM Diagnosis I89.0 Lymphedema, not elsewhere classified 03/23/2020 No Yes T81.31XD Disruption of external operation (surgical) wound, not elsewhere classified, 03/23/2020 No Yes subsequent encounter S90.811D Abrasion, right foot, subsequent encounter 03/23/2020 No Yes L97.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 layer exposed 03/23/2020 No Yes L97.312 Non-pressure chronic ulcer of right 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 03/23/2020 No Yes L97.322 Non-pressure chronic ulcer of left 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 03/23/2020 No Yes L89.616 Pressure-induced deep tissue damage of right heel 05/05/2020 No Yes Inactive Problems ICD-10 Code Description Active Date Inactive Date L03.115 Cellulitis of right lower limb 04/07/2020 04/07/2020 Resolved Problems Electronic Signature(s) Signed: 05/11/2020 8:09:40 AM By: Baltazar Najjar MD Entered By: Baltazar Najjar on 05/05/2020 09:53:26 -------------------------------------------------------------------------------- Progress Note Details Patient Name: Date of Service: [name] 05/05/2020 8:45 A M Medical Record Number: [id number] Patient Account Number: [id number] Date of Birth/Sex: Treating RN: Jun 16, 1960 (59 y.o. [name] Primary Care Provider: [name] Other Clinician: Referring Provider: Treating Provider/Extender: [name],  [name] in Treatment: 6 Subjective History of Present Illness (HPI) 11/23/15; this is a patient I remember from a previous stay in this clinic in 2012. The patient says this was for a wound in this same area as currently although I have no information to verify that. Most of the information is provided by his caregiver from the group home where he resides. Apparently he has had a wound in this area for 8 months. He also has erythema around this area and has had multiple rounds of antibiotics apparently with some improvement but the erythema is persists. He apparently had a fracture in the ankle 2 months ago and was seen by Dr. Lajoyce Corners . He apparently had a splint applied and more recently a heel offloading boot. He had home health coming out to a week ago not clear what they are dressing this with but they apparently discharged him perhaps because of one of Dr. 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 He has a Nurse, adult for transferring at home he is nonambulatory and does not wear footwear. He is not a diabetic. Looking [facility] he appears to have fairly active primary care. The area on the ankle is variably labeled as a pressure area and/or chronic venous insufficiency. He had a recent venous ultrasound on 5/25 that did not show a DVT in the left leg however this was not a reflux study. There was no superficial DVT either. Arterial studies on 10/15/14 showed a left ABI of 1.16 Center right of 1.12 waveforms were triphasic he does not have significant arterial disease. He has had recent x-rays of the left foot and ankle. The left foot did not show any abnormality. Left ankle x-ray showed a spiral fracture of the left tibial diaphysis without evidence of osteomyelitis. 11/30/15 culture I did last week showed pseudomonas I changed him to oral ciprofloxacin. Erythema is a lot better. 12/07/15 area around the wound shows considerable improvement of  the periwound erythema. 12/14/15;  05/05/2020 5:15:15 PM Version By: Zenaida Deed RN, BSN Entered By: Zenaida Deed on 05/09/2020  10:02:45 -------------------------------------------------------------------------------- Problem List Details Patient Name: Date of Service: [name], [name] 05/05/2020 8:45 A M Medical Record Number: [id number] Patient Account Number: [id number] Date of Birth/Sex: Treating RN: [birth year] (59 y.o. [name] Primary Care Provider: [name] Other Clinician: Referring Provider: Treating Provider/Extender: [name], [name] in Treatment: 6 Active Problems ICD-10 Encounter Code Description Active Date MDM Diagnosis I89.0 Lymphedema, not elsewhere classified 03/23/2020 No Yes T81.31XD Disruption of external operation (surgical) wound, not elsewhere classified, 03/23/2020 No Yes subsequent encounter S90.811D Abrasion, right foot, subsequent encounter 03/23/2020 No Yes L97.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 layer exposed 03/23/2020 No Yes L97.312 Non-pressure chronic ulcer of right 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 03/23/2020 No Yes L97.322 Non-pressure chronic ulcer of left 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 03/23/2020 No Yes L89.616 Pressure-induced deep tissue damage of right heel 05/05/2020 No Yes Inactive Problems ICD-10 Code Description Active Date Inactive Date L03.115 Cellulitis of right lower limb 04/07/2020 04/07/2020 Resolved Problems Electronic Signature(s) Signed: 05/11/2020 8:09:40 AM By: Baltazar Najjar MD Entered By: Baltazar Najjar on 05/05/2020 09:53:26 -------------------------------------------------------------------------------- Progress Note Details Patient Name: Date of Service: [name] 05/05/2020 8:45 A M Medical Record Number: [id number] Patient Account Number: [id number] Date of Birth/Sex: Treating RN: Jun 16, 1960 (59 y.o. [name] Primary Care Provider: [name] Other Clinician: Referring Provider: Treating Provider/Extender: [name],  [name] in Treatment: 6 Subjective History of Present Illness (HPI) 11/23/15; this is a patient I remember from a previous stay in this clinic in 2012. The patient says this was for a wound in this same area as currently although I have no information to verify that. Most of the information is provided by his caregiver from the group home where he resides. Apparently he has had a wound in this area for 8 months. He also has erythema around this area and has had multiple rounds of antibiotics apparently with some improvement but the erythema is persists. He apparently had a fracture in the ankle 2 months ago and was seen by Dr. Lajoyce Corners . He apparently had a splint applied and more recently a heel offloading boot. He had home health coming out to a week ago not clear what they are dressing this with but they apparently discharged him perhaps because of one of Dr. 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 He has a Nurse, adult for transferring at home he is nonambulatory and does not wear footwear. He is not a diabetic. Looking [facility] he appears to have fairly active primary care. The area on the ankle is variably labeled as a pressure area and/or chronic venous insufficiency. He had a recent venous ultrasound on 5/25 that did not show a DVT in the left leg however this was not a reflux study. There was no superficial DVT either. Arterial studies on 10/15/14 showed a left ABI of 1.16 Center right of 1.12 waveforms were triphasic he does not have significant arterial disease. He has had recent x-rays of the left foot and ankle. The left foot did not show any abnormality. Left ankle x-ray showed a spiral fracture of the left tibial diaphysis without evidence of osteomyelitis. 11/30/15 culture I did last week showed pseudomonas I changed him to oral ciprofloxacin. Erythema is a lot better. 12/07/15 area around the wound shows considerable improvement of  the periwound erythema. 12/14/15;  05/05/2020 5:15:15 PM Version By: Zenaida Deed RN, BSN Entered By: Zenaida Deed on 05/09/2020  10:02:45 -------------------------------------------------------------------------------- Problem List Details Patient Name: Date of Service: [name], [name] 05/05/2020 8:45 A M Medical Record Number: [id number] Patient Account Number: [id number] Date of Birth/Sex: Treating RN: [birth year] (59 y.o. [name] Primary Care Provider: [name] Other Clinician: Referring Provider: Treating Provider/Extender: [name], [name] in Treatment: 6 Active Problems ICD-10 Encounter Code Description Active Date MDM Diagnosis I89.0 Lymphedema, not elsewhere classified 03/23/2020 No Yes T81.31XD Disruption of external operation (surgical) wound, not elsewhere classified, 03/23/2020 No Yes subsequent encounter S90.811D Abrasion, right foot, subsequent encounter 03/23/2020 No Yes L97.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 layer exposed 03/23/2020 No Yes L97.312 Non-pressure chronic ulcer of right 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 03/23/2020 No Yes L97.322 Non-pressure chronic ulcer of left 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 03/23/2020 No Yes L89.616 Pressure-induced deep tissue damage of right heel 05/05/2020 No Yes Inactive Problems ICD-10 Code Description Active Date Inactive Date L03.115 Cellulitis of right lower limb 04/07/2020 04/07/2020 Resolved Problems Electronic Signature(s) Signed: 05/11/2020 8:09:40 AM By: Baltazar Najjar MD Entered By: Baltazar Najjar on 05/05/2020 09:53:26 -------------------------------------------------------------------------------- Progress Note Details Patient Name: Date of Service: [name] 05/05/2020 8:45 A M Medical Record Number: [id number] Patient Account Number: [id number] Date of Birth/Sex: Treating RN: Jun 16, 1960 (59 y.o. [name] Primary Care Provider: [name] Other Clinician: Referring Provider: Treating Provider/Extender: [name],  [name] in Treatment: 6 Subjective History of Present Illness (HPI) 11/23/15; this is a patient I remember from a previous stay in this clinic in 2012. The patient says this was for a wound in this same area as currently although I have no information to verify that. Most of the information is provided by his caregiver from the group home where he resides. Apparently he has had a wound in this area for 8 months. He also has erythema around this area and has had multiple rounds of antibiotics apparently with some improvement but the erythema is persists. He apparently had a fracture in the ankle 2 months ago and was seen by Dr. Lajoyce Corners . He apparently had a splint applied and more recently a heel offloading boot. He had home health coming out to a week ago not clear what they are dressing this with but they apparently discharged him perhaps because of one of Dr. 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 He has a Nurse, adult for transferring at home he is nonambulatory and does not wear footwear. He is not a diabetic. Looking [facility] he appears to have fairly active primary care. The area on the ankle is variably labeled as a pressure area and/or chronic venous insufficiency. He had a recent venous ultrasound on 5/25 that did not show a DVT in the left leg however this was not a reflux study. There was no superficial DVT either. Arterial studies on 10/15/14 showed a left ABI of 1.16 Center right of 1.12 waveforms were triphasic he does not have significant arterial disease. He has had recent x-rays of the left foot and ankle. The left foot did not show any abnormality. Left ankle x-ray showed a spiral fracture of the left tibial diaphysis without evidence of osteomyelitis. 11/30/15 culture I did last week showed pseudomonas I changed him to oral ciprofloxacin. Erythema is a lot better. 12/07/15 area around the wound shows considerable improvement of  the periwound erythema. 12/14/15;  The patient is in a lot of pain 12/10; the patient comes in with a new deep tissue injury on the right heel large necrotic wound on the right lateral foot, smaller necrotic wound on the dorsal ankle and medial ankle. Necrotic wounds on the second and fourth toes. We have been using Hydrofera Blue On the left he has the area on the left little medial ankle which actually looks some better healthier looking tissue. We are using Hydrofera Blue here as well X-ray ordered last week showed no osseous erosion no plain film indication of osteomyelitis. Electronic Signature(s) Signed: 05/11/2020 8:09:40 AM By: Baltazar Najjar MD Entered By: Baltazar Najjar on 05/05/2020 09:55:51 -------------------------------------------------------------------------------- Physical Exam Details Patient  Name: Date of Service: [name], [name] 05/05/2020 8:45 A M Medical Record Number: [id number] Patient Account Number: [id number] Date of Birth/Sex: Treating RN: 10/06/1960 (59 y.o. [name] Primary Care Provider: [name] Other Clinician: Referring Provider: Treating Provider/Extender: [name], [name] in Treatment: 6 Constitutional Sitting or standing Blood Pressure is within target range for patient.. Pulse regular and within target range for patient.Marland Kitchen Respirations regular, non-labored and within target range.. Temperature is normal and within the target range for the patient.Marland Kitchen Appears in no distress. Notes Wound exam On the left his wound on the left medial ankle actually looks better. Healthy granulation and rims of epithelialization On the right had absolutely nothing looks any better. He has a deep tissue injury on the right heel that looks threatening but not open yet. He has a large necrotic wound on the right lateral foot, smaller areas on the right dorsal and medial ankle and the second and fourth toes. I used a #15 scalpel and pickups to take necrotic debris off the surface of the lateral foot wound and then a 5 curette to obtain a tissue sample for PCR culture Electronic Signature(s) Signed: 05/11/2020 8:09:40 AM By: Baltazar Najjar MD Entered By: Baltazar Najjar on 05/05/2020 09:57:45 -------------------------------------------------------------------------------- Physician Orders Details Patient Name: Date of Service: [name] 05/05/2020 8:45 A M Medical Record Number: [id number] Patient Account Number: [id number] Date of Birth/Sex: Treating RN: 05-Sep-1960 (59 y.o. 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 Bathing/ Shower/ Hygiene May shower with protection but do not get wound dressing(s) wet. Edema Control - Lymphedema / SCD / Other Bilateral Lower Extremities Elevate legs to the level of the heart or above for 30 minutes daily and/or when sitting, a frequency of: Off-Loading Heel suspension boot to: - heel protectors at all times Other: - avoid any pressure to back of heels Home Health No change in wound care orders this week; continue Home Health for wound care. May utilize formulary equivalent dressing for wound treatment orders unless otherwise specified. Other Home Health Orders/Instructions: - [facility] Wound Treatment Wound #40 - Lower Leg Wound Laterality: Left, Medial Cleanser: Wound Cleanser (Home Health) 2 x Per Week/30 Days Discharge Instructions: Cleanse the wound with wound cleanser prior to applying a clean  The patient is in a lot of pain 12/10; the patient comes in with a new deep tissue injury on the right heel large necrotic wound on the right lateral foot, smaller necrotic wound on the dorsal ankle and medial ankle. Necrotic wounds on the second and fourth toes. We have been using Hydrofera Blue On the left he has the area on the left little medial ankle which actually looks some better healthier looking tissue. We are using Hydrofera Blue here as well X-ray ordered last week showed no osseous erosion no plain film indication of osteomyelitis. Electronic Signature(s) Signed: 05/11/2020 8:09:40 AM By: Baltazar Najjar MD Entered By: Baltazar Najjar on 05/05/2020 09:55:51 -------------------------------------------------------------------------------- Physical Exam Details Patient  Name: Date of Service: [name], [name] 05/05/2020 8:45 A M Medical Record Number: [id number] Patient Account Number: [id number] Date of Birth/Sex: Treating RN: 10/06/1960 (59 y.o. [name] Primary Care Provider: [name] Other Clinician: Referring Provider: Treating Provider/Extender: [name], [name] in Treatment: 6 Constitutional Sitting or standing Blood Pressure is within target range for patient.. Pulse regular and within target range for patient.Marland Kitchen Respirations regular, non-labored and within target range.. Temperature is normal and within the target range for the patient.Marland Kitchen Appears in no distress. Notes Wound exam On the left his wound on the left medial ankle actually looks better. Healthy granulation and rims of epithelialization On the right had absolutely nothing looks any better. He has a deep tissue injury on the right heel that looks threatening but not open yet. He has a large necrotic wound on the right lateral foot, smaller areas on the right dorsal and medial ankle and the second and fourth toes. I used a #15 scalpel and pickups to take necrotic debris off the surface of the lateral foot wound and then a 5 curette to obtain a tissue sample for PCR culture Electronic Signature(s) Signed: 05/11/2020 8:09:40 AM By: Baltazar Najjar MD Entered By: Baltazar Najjar on 05/05/2020 09:57:45 -------------------------------------------------------------------------------- Physician Orders Details Patient Name: Date of Service: [name] 05/05/2020 8:45 A M Medical Record Number: [id number] Patient Account Number: [id number] Date of Birth/Sex: Treating RN: 05-Sep-1960 (59 y.o. 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 Bathing/ Shower/ Hygiene May shower with protection but do not get wound dressing(s) wet. Edema Control - Lymphedema / SCD / Other Bilateral Lower Extremities Elevate legs to the level of the heart or above for 30 minutes daily and/or when sitting, a frequency of: Off-Loading Heel suspension boot to: - heel protectors at all times Other: - avoid any pressure to back of heels Home Health No change in wound care orders this week; continue Home Health for wound care. May utilize formulary equivalent dressing for wound treatment orders unless otherwise specified. Other Home Health Orders/Instructions: - [facility]ound Treatment Wound #40 - Lower Leg Wound Laterality: Left, Medial Cleanser: Wound Cleanser (Home Health) 2 x Per Week/30 Days Discharge Instructions: Cleanse the wound with wound cleanser prior to applying a clean  The patient is in a lot of pain 12/10; the patient comes in with a new deep tissue injury on the right heel large necrotic wound on the right lateral foot, smaller necrotic wound on the dorsal ankle and medial ankle. Necrotic wounds on the second and fourth toes. We have been using Hydrofera Blue On the left he has the area on the left little medial ankle which actually looks some better healthier looking tissue. We are using Hydrofera Blue here as well X-ray ordered last week showed no osseous erosion no plain film indication of osteomyelitis. Electronic Signature(s) Signed: 05/11/2020 8:09:40 AM By: Baltazar Najjar MD Entered By: Baltazar Najjar on 05/05/2020 09:55:51 -------------------------------------------------------------------------------- Physical Exam Details Patient  Name: Date of Service: [name], [name] 05/05/2020 8:45 A M Medical Record Number: [id number] Patient Account Number: [id number] Date of Birth/Sex: Treating RN: 10/06/1960 (59 y.o. [name] Primary Care Provider: [name] Other Clinician: Referring Provider: Treating Provider/Extender: [name], [name] in Treatment: 6 Constitutional Sitting or standing Blood Pressure is within target range for patient.. Pulse regular and within target range for patient.Marland Kitchen Respirations regular, non-labored and within target range.. Temperature is normal and within the target range for the patient.Marland Kitchen Appears in no distress. Notes Wound exam On the left his wound on the left medial ankle actually looks better. Healthy granulation and rims of epithelialization On the right had absolutely nothing looks any better. He has a deep tissue injury on the right heel that looks threatening but not open yet. He has a large necrotic wound on the right lateral foot, smaller areas on the right dorsal and medial ankle and the second and fourth toes. I used a #15 scalpel and pickups to take necrotic debris off the surface of the lateral foot wound and then a 5 curette to obtain a tissue sample for PCR culture Electronic Signature(s) Signed: 05/11/2020 8:09:40 AM By: Baltazar Najjar MD Entered By: Baltazar Najjar on 05/05/2020 09:57:45 -------------------------------------------------------------------------------- Physician Orders Details Patient Name: Date of Service: [name] 05/05/2020 8:45 A M Medical Record Number: [id number] Patient Account Number: [id number] Date of Birth/Sex: Treating RN: 05-Sep-1960 (59 y.o. 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 Bathing/ Shower/ Hygiene May shower with protection but do not get wound dressing(s) wet. Edema Control - Lymphedema / SCD / Other Bilateral Lower Extremities Elevate legs to the level of the heart or above for 30 minutes daily and/or when sitting, a frequency of: Off-Loading Heel suspension boot to: - heel protectors at all times Other: - avoid any pressure to back of heels Home Health No change in wound care orders this week; continue Home Health for wound care. May utilize formulary equivalent dressing for wound treatment orders unless otherwise specified. Other Home Health Orders/Instructions: - [facility]ound Treatment Wound #40 - Lower Leg Wound Laterality: Left, Medial Cleanser: Wound Cleanser (Home Health) 2 x Per Week/30 Days Discharge Instructions: Cleanse the wound with wound cleanser prior to applying a clean  The patient is in a lot of pain 12/10; the patient comes in with a new deep tissue injury on the right heel large necrotic wound on the right lateral foot, smaller necrotic wound on the dorsal ankle and medial ankle. Necrotic wounds on the second and fourth toes. We have been using Hydrofera Blue On the left he has the area on the left little medial ankle which actually looks some better healthier looking tissue. We are using Hydrofera Blue here as well X-ray ordered last week showed no osseous erosion no plain film indication of osteomyelitis. Electronic Signature(s) Signed: 05/11/2020 8:09:40 AM By: Baltazar Najjar MD Entered By: Baltazar Najjar on 05/05/2020 09:55:51 -------------------------------------------------------------------------------- Physical Exam Details Patient  Name: Date of Service: [name], [name] 05/05/2020 8:45 A M Medical Record Number: [id number] Patient Account Number: [id number] Date of Birth/Sex: Treating RN: 10/06/1960 (59 y.o. [name] Primary Care Provider: [name] Other Clinician: Referring Provider: Treating Provider/Extender: [name], [name] in Treatment: 6 Constitutional Sitting or standing Blood Pressure is within target range for patient.. Pulse regular and within target range for patient.Marland Kitchen Respirations regular, non-labored and within target range.. Temperature is normal and within the target range for the patient.Marland Kitchen Appears in no distress. Notes Wound exam On the left his wound on the left medial ankle actually looks better. Healthy granulation and rims of epithelialization On the right had absolutely nothing looks any better. He has a deep tissue injury on the right heel that looks threatening but not open yet. He has a large necrotic wound on the right lateral foot, smaller areas on the right dorsal and medial ankle and the second and fourth toes. I used a #15 scalpel and pickups to take necrotic debris off the surface of the lateral foot wound and then a 5 curette to obtain a tissue sample for PCR culture Electronic Signature(s) Signed: 05/11/2020 8:09:40 AM By: Baltazar Najjar MD Entered By: Baltazar Najjar on 05/05/2020 09:57:45 -------------------------------------------------------------------------------- Physician Orders Details Patient Name: Date of Service: [name] 05/05/2020 8:45 A M Medical Record Number: [id number] Patient Account Number: [id number] Date of Birth/Sex: Treating RN: 05-Sep-1960 (59 y.o. 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 Bathing/ Shower/ Hygiene May shower with protection but do not get wound dressing(s) wet. Edema Control - Lymphedema / SCD / Other Bilateral Lower Extremities Elevate legs to the level of the heart or above for 30 minutes daily and/or when sitting, a frequency of: Off-Loading Heel suspension boot to: - heel protectors at all times Other: - avoid any pressure to back of heels Home Health No change in wound care orders this week; continue Home Health for wound care. May utilize formulary equivalent dressing for wound treatment orders unless otherwise specified. Other Home Health Orders/Instructions: - [facility]ound Treatment Wound #40 - Lower Leg Wound Laterality: Left, Medial Cleanser: Wound Cleanser (Home Health) 2 x Per Week/30 Days Discharge Instructions: Cleanse the wound with wound cleanser prior to applying a clean  05/05/2020 5:15:15 PM Version By: Zenaida Deed RN, BSN Entered By: Zenaida Deed on 05/09/2020  10:02:45 -------------------------------------------------------------------------------- Problem List Details Patient Name: Date of Service: [name], [name] 05/05/2020 8:45 A M Medical Record Number: [id number] Patient Account Number: [id number] Date of Birth/Sex: Treating RN: [birth year] (59 y.o. [name] Primary Care Provider: [name] Other Clinician: Referring Provider: Treating Provider/Extender: [name], [name] in Treatment: 6 Active Problems ICD-10 Encounter Code Description Active Date MDM Diagnosis I89.0 Lymphedema, not elsewhere classified 03/23/2020 No Yes T81.31XD Disruption of external operation (surgical) wound, not elsewhere classified, 03/23/2020 No Yes subsequent encounter S90.811D Abrasion, right foot, subsequent encounter 03/23/2020 No Yes L97.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 layer exposed 03/23/2020 No Yes L97.312 Non-pressure chronic ulcer of right 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 03/23/2020 No Yes L97.322 Non-pressure chronic ulcer of left 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 03/23/2020 No Yes L89.616 Pressure-induced deep tissue damage of right heel 05/05/2020 No Yes Inactive Problems ICD-10 Code Description Active Date Inactive Date L03.115 Cellulitis of right lower limb 04/07/2020 04/07/2020 Resolved Problems Electronic Signature(s) Signed: 05/11/2020 8:09:40 AM By: Baltazar Najjar MD Entered By: Baltazar Najjar on 05/05/2020 09:53:26 -------------------------------------------------------------------------------- Progress Note Details Patient Name: Date of Service: [name] 05/05/2020 8:45 A M Medical Record Number: [id number] Patient Account Number: [id number] Date of Birth/Sex: Treating RN: Jun 16, 1960 (59 y.o. [name] Primary Care Provider: [name] Other Clinician: Referring Provider: Treating Provider/Extender: [name],  [name] in Treatment: 6 Subjective History of Present Illness (HPI) 11/23/15; this is a patient I remember from a previous stay in this clinic in 2012. The patient says this was for a wound in this same area as currently although I have no information to verify that. Most of the information is provided by his caregiver from the group home where he resides. Apparently he has had a wound in this area for 8 months. He also has erythema around this area and has had multiple rounds of antibiotics apparently with some improvement but the erythema is persists. He apparently had a fracture in the ankle 2 months ago and was seen by Dr. Lajoyce Corners . He apparently had a splint applied and more recently a heel offloading boot. He had home health coming out to a week ago not clear what they are dressing this with but they apparently discharged him perhaps because of one of Dr. 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 He has a Nurse, adult for transferring at home he is nonambulatory and does not wear footwear. He is not a diabetic. Looking through [facility] he appears to have fairly active primary care. The area on the ankle is variably labeled as a pressure area and/or chronic venous insufficiency. He had a recent venous ultrasound on 5/25 that did not show a DVT in the left leg however this was not a reflux study. There was no superficial DVT either. Arterial studies on 10/15/14 showed a left ABI of 1.16 Center right of 1.12 waveforms were triphasic he does not have significant arterial disease. He has had recent x-rays of the left foot and ankle. The left foot did not show any abnormality. Left ankle x-ray showed a spiral fracture of the left tibial diaphysis without evidence of osteomyelitis. 11/30/15 culture I did last week showed pseudomonas I changed him to oral ciprofloxacin. Erythema is a lot better. 12/07/15 area around the wound shows considerable improvement of  the periwound erythema. 12/14/15;  The patient is in a lot of pain 12/10; the patient comes in with a new deep tissue injury on the right heel large necrotic wound on the right lateral foot, smaller necrotic wound on the dorsal ankle and medial ankle. Necrotic wounds on the second and fourth toes. We have been using Hydrofera Blue On the left he has the area on the left little medial ankle which actually looks some better healthier looking tissue. We are using Hydrofera Blue here as well X-ray ordered last week showed no osseous erosion no plain film indication of osteomyelitis. Electronic Signature(s) Signed: 05/11/2020 8:09:40 AM By: Baltazar Najjar MD Entered By: Baltazar Najjar on 05/05/2020 09:55:51 -------------------------------------------------------------------------------- Physical Exam Details Patient  Name: Date of Service: [name], [name] 05/05/2020 8:45 A M Medical Record Number: [id number] Patient Account Number: [id number] Date of Birth/Sex: Treating RN: 10/06/1960 (59 y.o. [name] Primary Care Provider: [name] Other Clinician: Referring Provider: Treating Provider/Extender: [name], [name] in Treatment: 6 Constitutional Sitting or standing Blood Pressure is within target range for patient.. Pulse regular and within target range for patient.Marland Kitchen Respirations regular, non-labored and within target range.. Temperature is normal and within the target range for the patient.Marland Kitchen Appears in no distress. Notes Wound exam On the left his wound on the left medial ankle actually looks better. Healthy granulation and rims of epithelialization On the right had absolutely nothing looks any better. He has a deep tissue injury on the right heel that looks threatening but not open yet. He has a large necrotic wound on the right lateral foot, smaller areas on the right dorsal and medial ankle and the second and fourth toes. I used a #15 scalpel and pickups to take necrotic debris off the surface of the lateral foot wound and then a 5 curette to obtain a tissue sample for PCR culture Electronic Signature(s) Signed: 05/11/2020 8:09:40 AM By: Baltazar Najjar MD Entered By: Baltazar Najjar on 05/05/2020 09:57:45 -------------------------------------------------------------------------------- Physician Orders Details Patient Name: Date of Service: [name] 05/05/2020 8:45 A M Medical Record Number: [id number] Patient Account Number: [id number] Date of Birth/Sex: Treating RN: 05-Sep-1960 (59 y.o. 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 Bathing/ Shower/ Hygiene May shower with protection but do not get wound dressing(s) wet. Edema Control - Lymphedema / SCD / Other Bilateral Lower Extremities Elevate legs to the level of the heart or above for 30 minutes daily and/or when sitting, a frequency of: Off-Loading Heel suspension boot to: - heel protectors at all times Other: - avoid any pressure to back of heels Home Health No change in wound care orders this week; continue Home Health for wound care. May utilize formulary equivalent dressing for wound treatment orders unless otherwise specified. Other Home Health Orders/Instructions: - [facility]ound [facility] Wound #40 - Lower Leg Wound Laterality: Left, Medial Cleanser: Wound Cleanser (Home Health) 2 x Per Week/30 Days Discharge Instructions: Cleanse the wound with wound cleanser prior to applying a clean  05/05/2020 5:15:15 PM Version By: Zenaida Deed RN, BSN Entered By: Zenaida Deed on 05/09/2020  10:02:45 -------------------------------------------------------------------------------- Problem List Details Patient Name: Date of Service: [name], [name] 05/05/2020 8:45 A M Medical Record Number: [id number] Patient Account Number: [id number] Date of Birth/Sex: Treating RN: [birth year] (59 y.o. [name] Primary Care Provider: [name] Other Clinician: Referring Provider: Treating Provider/Extender: [name], [name] in Treatment: 6 Active Problems ICD-10 Encounter Code Description Active Date MDM Diagnosis I89.0 Lymphedema, not elsewhere classified 03/23/2020 No Yes T81.31XD Disruption of external operation (surgical) wound, not elsewhere classified, 03/23/2020 No Yes subsequent encounter S90.811D Abrasion, right foot, subsequent encounter 03/23/2020 No Yes L97.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 layer exposed 03/23/2020 No Yes L97.312 Non-pressure chronic ulcer of right 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 03/23/2020 No Yes L97.322 Non-pressure chronic ulcer of left 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 03/23/2020 No Yes L89.616 Pressure-induced deep tissue damage of right heel 05/05/2020 No Yes Inactive Problems ICD-10 Code Description Active Date Inactive Date L03.115 Cellulitis of right lower limb 04/07/2020 04/07/2020 Resolved Problems Electronic Signature(s) Signed: 05/11/2020 8:09:40 AM By: Baltazar Najjar MD Entered By: Baltazar Najjar on 05/05/2020 09:53:26 -------------------------------------------------------------------------------- Progress Note Details Patient Name: Date of Service: [name] 05/05/2020 8:45 A M Medical Record Number: [id number] Patient Account Number: [id number] Date of Birth/Sex: Treating RN: Jun 16, 1960 (59 y.o. [name] Primary Care Provider: [name] Other Clinician: Referring Provider: Treating Provider/Extender: [name],  [name] in Treatment: 6 Subjective History of Present Illness (HPI) 11/23/15; this is a patient I remember from a previous stay in this clinic in 2012. The patient says this was for a wound in this same area as currently although I have no information to verify that. Most of the information is provided by his caregiver from the group home where he resides. Apparently he has had a wound in this area for 8 months. He also has erythema around this area and has had multiple rounds of antibiotics apparently with some improvement but the erythema is persists. He apparently had a fracture in the ankle 2 months ago and was seen by Dr. Lajoyce Corners . He apparently had a splint applied and more recently a heel offloading boot. He had home health coming out to a week ago not clear what they are dressing this with but they apparently discharged him perhaps because of one of Dr. 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 He has a Nurse, adult for transferring at home he is nonambulatory and does not wear footwear. He is not a diabetic. Looking through [facility] he appears to have fairly active primary care. The area on the ankle is variably labeled as a pressure area and/or chronic venous insufficiency. He had a recent venous ultrasound on 5/25 that did not show a DVT in the left leg however this was not a reflux study. There was no superficial DVT either. Arterial studies on 10/15/14 showed a left ABI of 1.16 Center right of 1.12 waveforms were triphasic he does not have significant arterial disease. He has had recent x-rays of the left foot and ankle. The left foot did not show any abnormality. Left ankle x-ray showed a spiral fracture of the left tibial diaphysis without evidence of osteomyelitis. 11/30/15 culture I did last week showed pseudomonas I changed him to oral ciprofloxacin. Erythema is a lot better. 12/07/15 area around the wound shows considerable improvement of  the periwound erythema. 12/14/15;  The patient is in a lot of pain 12/10; the patient comes in with a new deep tissue injury on the right heel large necrotic wound on the right lateral foot, smaller necrotic wound on the dorsal ankle and medial ankle. Necrotic wounds on the second and fourth toes. We have been using Hydrofera Blue On the left he has the area on the left little medial ankle which actually looks some better healthier looking tissue. We are using Hydrofera Blue here as well X-ray ordered last week showed no osseous erosion no plain film indication of osteomyelitis. Electronic Signature(s) Signed: 05/11/2020 8:09:40 AM By: Baltazar Najjar MD Entered By: Baltazar Najjar on 05/05/2020 09:55:51 -------------------------------------------------------------------------------- Physical Exam Details Patient  Name: Date of Service: [name], [name] 05/05/2020 8:45 A M Medical Record Number: [id number] Patient Account Number: [id number] Date of Birth/Sex: Treating RN: 10/06/1960 (59 y.o. [name] Primary Care Provider: [name] Other Clinician: Referring Provider: Treating Provider/Extender: [name], [name] in Treatment: 6 Constitutional Sitting or standing Blood Pressure is within target range for patient.. Pulse regular and within target range for patient.Marland Kitchen Respirations regular, non-labored and within target range.. Temperature is normal and within the target range for the patient.Marland Kitchen Appears in no distress. Notes Wound exam On the left his wound on the left medial ankle actually looks better. Healthy granulation and rims of epithelialization On the right had absolutely nothing looks any better. He has a deep tissue injury on the right heel that looks threatening but not open yet. He has a large necrotic wound on the right lateral foot, smaller areas on the right dorsal and medial ankle and the second and fourth toes. I used a #15 scalpel and pickups to take necrotic debris off the surface of the lateral foot wound and then a 5 curette to obtain a tissue sample for PCR culture Electronic Signature(s) Signed: 05/11/2020 8:09:40 AM By: Baltazar Najjar MD Entered By: Baltazar Najjar on 05/05/2020 09:57:45 -------------------------------------------------------------------------------- Physician Orders Details Patient Name: Date of Service: [name] 05/05/2020 8:45 A M Medical Record Number: [id number] Patient Account Number: [id number] Date of Birth/Sex: Treating RN: 05-Sep-1960 (59 y.o. 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 Bathing/ Shower/ Hygiene May shower with protection but do not get wound dressing(s) wet. Edema Control - Lymphedema / SCD / Other Bilateral Lower Extremities Elevate legs to the level of the heart or above for 30 minutes daily and/or when sitting, a frequency of: Off-Loading Heel suspension boot to: - heel protectors at all times Other: - avoid any pressure to back of heels Home Health No change in wound care orders this week; continue Home Health for wound care. May utilize formulary equivalent dressing for wound treatment orders unless otherwise specified. Other Home Health Orders/Instructions: - [facility]ound Treatment Wound #40 - Lower Leg Wound Laterality: Left, Medial Cleanser: Wound Cleanser (Home Health) 2 x Per Week/30 Days Discharge Instructions: Cleanse the wound with wound cleanser prior to applying a clean  The patient is in a lot of pain 12/10; the patient comes in with a new deep tissue injury on the right heel large necrotic wound on the right lateral foot, smaller necrotic wound on the dorsal ankle and medial ankle. Necrotic wounds on the second and fourth toes. We have been using Hydrofera Blue On the left he has the area on the left little medial ankle which actually looks some better healthier looking tissue. We are using Hydrofera Blue here as well X-ray ordered last week showed no osseous erosion no plain film indication of osteomyelitis. Electronic Signature(s) Signed: 05/11/2020 8:09:40 AM By: Baltazar Najjar MD Entered By: Baltazar Najjar on 05/05/2020 09:55:51 -------------------------------------------------------------------------------- Physical Exam Details Patient  Name: Date of Service: [name], [name] 05/05/2020 8:45 A M Medical Record Number: [id number] Patient Account Number: [id number] Date of Birth/Sex: Treating RN: 10/06/1960 (59 y.o. [name] Primary Care Provider: [name] Other Clinician: Referring Provider: Treating Provider/Extender: [name], [name] in Treatment: 6 Constitutional Sitting or standing Blood Pressure is within target range for patient.. Pulse regular and within target range for patient.Marland Kitchen Respirations regular, non-labored and within target range.. Temperature is normal and within the target range for the patient.Marland Kitchen Appears in no distress. Notes Wound exam On the left his wound on the left medial ankle actually looks better. Healthy granulation and rims of epithelialization On the right had absolutely nothing looks any better. He has a deep tissue injury on the right heel that looks threatening but not open yet. He has a large necrotic wound on the right lateral foot, smaller areas on the right dorsal and medial ankle and the second and fourth toes. I used a #15 scalpel and pickups to take necrotic debris off the surface of the lateral foot wound and then a 5 curette to obtain a tissue sample for PCR culture Electronic Signature(s) Signed: 05/11/2020 8:09:40 AM By: Baltazar Najjar MD Entered By: Baltazar Najjar on 05/05/2020 09:57:45 -------------------------------------------------------------------------------- Physician Orders Details Patient Name: Date of Service: [name] 05/05/2020 8:45 A M Medical Record Number: [id number] Patient Account Number: [id number] Date of Birth/Sex: Treating RN: 05-Sep-1960 (59 y.o. 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 Bathing/ Shower/ Hygiene May shower with protection but do not get wound dressing(s) wet. Edema Control - Lymphedema / SCD / Other Bilateral Lower Extremities Elevate legs to the level of the heart or above for 30 minutes daily and/or when sitting, a frequency of: Off-Loading Heel suspension boot to: - heel protectors at all times Other: - avoid any pressure to back of heels Home Health No change in wound care orders this week; continue Home Health for wound care. May utilize formulary equivalent dressing for wound treatment orders unless otherwise specified. Other Home Health Orders/Instructions: - [facility]ound [facility] Wound #40 - Lower Leg Wound Laterality: Left, Medial Cleanser: Wound Cleanser (Home Health) 2 x Per Week/30 Days Discharge Instructions: Cleanse the wound with wound cleanser prior to applying a clean  The patient is in a lot of pain 12/10; the patient comes in with a new deep tissue injury on the right heel large necrotic wound on the right lateral foot, smaller necrotic wound on the dorsal ankle and medial ankle. Necrotic wounds on the second and fourth toes. We have been using Hydrofera Blue On the left he has the area on the left little medial ankle which actually looks some better healthier looking tissue. We are using Hydrofera Blue here as well X-ray ordered last week showed no osseous erosion no plain film indication of osteomyelitis. Electronic Signature(s) Signed: 05/11/2020 8:09:40 AM By: Baltazar Najjar MD Entered By: Baltazar Najjar on 05/05/2020 09:55:51 -------------------------------------------------------------------------------- Physical Exam Details Patient  Name: Date of Service: [name], [name] 05/05/2020 8:45 A M Medical Record Number: [id number] Patient Account Number: [id number] Date of Birth/Sex: Treating RN: 10/06/1960 (59 y.o. [name] Primary Care Provider: [name] Other Clinician: Referring Provider: Treating Provider/Extender: [name], [name] in Treatment: 6 Constitutional Sitting or standing Blood Pressure is within target range for patient.. Pulse regular and within target range for patient.Marland Kitchen Respirations regular, non-labored and within target range.. Temperature is normal and within the target range for the patient.Marland Kitchen Appears in no distress. Notes Wound exam On the left his wound on the left medial ankle actually looks better. Healthy granulation and rims of epithelialization On the right had absolutely nothing looks any better. He has a deep tissue injury on the right heel that looks threatening but not open yet. He has a large necrotic wound on the right lateral foot, smaller areas on the right dorsal and medial ankle and the second and fourth toes. I used a #15 scalpel and pickups to take necrotic debris off the surface of the lateral foot wound and then a 5 curette to obtain a tissue sample for PCR culture Electronic Signature(s) Signed: 05/11/2020 8:09:40 AM By: Baltazar Najjar MD Entered By: Baltazar Najjar on 05/05/2020 09:57:45 -------------------------------------------------------------------------------- Physician Orders Details Patient Name: Date of Service: [name] 05/05/2020 8:45 A M Medical Record Number: [id number] Patient Account Number: [id number] Date of Birth/Sex: Treating RN: 05-Sep-1960 (59 y.o. 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 Bathing/ Shower/ Hygiene May shower with protection but do not get wound dressing(s) wet. Edema Control - Lymphedema / SCD / Other Bilateral Lower Extremities Elevate legs to the level of the heart or above for 30 minutes daily and/or when sitting, a frequency of: Off-Loading Heel suspension boot to: - heel protectors at all times Other: - avoid any pressure to back of heels Home Health No change in wound care orders this week; continue Home Health for wound care. May utilize formulary equivalent dressing for wound treatment orders unless otherwise specified. Other Home Health Orders/Instructions: - [facility]ound Treatment Wound #40 - Lower Leg Wound Laterality: Left, Medial Cleanser: Wound Cleanser (Home Health) 2 x Per Week/30 Days Discharge Instructions: Cleanse the wound with wound cleanser prior to applying a clean  05/05/2020 5:15:15 PM Version By: Zenaida Deed RN, BSN Entered By: Zenaida Deed on 05/09/2020  10:02:45 -------------------------------------------------------------------------------- Problem List Details Patient Name: Date of Service: [name], [name] 05/05/2020 8:45 A M Medical Record Number: [id number] Patient Account Number: [id number] Date of Birth/Sex: Treating RN: [birth year] (59 y.o. [name] Primary Care Provider: [name] Other Clinician: Referring Provider: Treating Provider/Extender: [name], [name] in Treatment: 6 Active Problems ICD-10 Encounter Code Description Active Date MDM Diagnosis I89.0 Lymphedema, not elsewhere classified 03/23/2020 No Yes T81.31XD Disruption of external operation (surgical) wound, not elsewhere classified, 03/23/2020 No Yes subsequent encounter S90.811D Abrasion, right foot, subsequent encounter 03/23/2020 No Yes L97.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 layer exposed 03/23/2020 No Yes L97.312 Non-pressure chronic ulcer of right 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 03/23/2020 No Yes L97.322 Non-pressure chronic ulcer of left 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 03/23/2020 No Yes L89.616 Pressure-induced deep tissue damage of right heel 05/05/2020 No Yes Inactive Problems ICD-10 Code Description Active Date Inactive Date L03.115 Cellulitis of right lower limb 04/07/2020 04/07/2020 Resolved Problems Electronic Signature(s) Signed: 05/11/2020 8:09:40 AM By: Baltazar Najjar MD Entered By: Baltazar Najjar on 05/05/2020 09:53:26 -------------------------------------------------------------------------------- Progress Note Details Patient Name: Date of Service: [name] 05/05/2020 8:45 A M Medical Record Number: [id number] Patient Account Number: [id number] Date of Birth/Sex: Treating RN: Jun 16, 1960 (59 y.o. [name] Primary Care Provider: [name] Other Clinician: Referring Provider: Treating Provider/Extender: [name],  [name] in Treatment: 6 Subjective History of Present Illness (HPI) 11/23/15; this is a patient I remember from a previous stay in this clinic in 2012. The patient says this was for a wound in this same area as currently although I have no information to verify that. Most of the information is provided by his caregiver from the group home where he resides. Apparently he has had a wound in this area for 8 months. He also has erythema around this area and has had multiple rounds of antibiotics apparently with some improvement but the erythema is persists. He apparently had a fracture in the ankle 2 months ago and was seen by Dr. Lajoyce Corners . He apparently had a splint applied and more recently a heel offloading boot. He had home health coming out to a week ago not clear what they are dressing this with but they apparently discharged him perhaps because of one of Dr. 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 He has a Nurse, adult for transferring at home he is nonambulatory and does not wear footwear. He is not a diabetic. Looking through [facility] he appears to have fairly active primary care. The area on the ankle is variably labeled as a pressure area and/or chronic venous insufficiency. He had a recent venous ultrasound on 5/25 that did not show a DVT in the left leg however this was not a reflux study. There was no superficial DVT either. Arterial studies on 10/15/14 showed a left ABI of 1.16 Center right of 1.12 waveforms were triphasic he does not have significant arterial disease. He has had recent x-rays of the left foot and ankle. The left foot did not show any abnormality. Left ankle x-ray showed a spiral fracture of the left tibial diaphysis without evidence of osteomyelitis. 11/30/15 culture I did last week showed pseudomonas I changed him to oral ciprofloxacin. Erythema is a lot better. 12/07/15 area around the wound shows considerable improvement of  the periwound erythema. 12/14/15;  The patient is in a lot of pain 12/10; the patient comes in with a new deep tissue injury on the right heel large necrotic wound on the right lateral foot, smaller necrotic wound on the dorsal ankle and medial ankle. Necrotic wounds on the second and fourth toes. We have been using Hydrofera Blue On the left he has the area on the left little medial ankle which actually looks some better healthier looking tissue. We are using Hydrofera Blue here as well X-ray ordered last week showed no osseous erosion no plain film indication of osteomyelitis. Electronic Signature(s) Signed: 05/11/2020 8:09:40 AM By: Baltazar Najjar MD Entered By: Baltazar Najjar on 05/05/2020 09:55:51 -------------------------------------------------------------------------------- Physical Exam Details Patient  Name: Date of Service: [name], [name] 05/05/2020 8:45 A M Medical Record Number: [id number] Patient Account Number: [id number] Date of Birth/Sex: Treating RN: 10/06/1960 (59 y.o. [name] Primary Care Provider: [name] Other Clinician: Referring Provider: Treating Provider/Extender: [name], [name] in Treatment: 6 Constitutional Sitting or standing Blood Pressure is within target range for patient.. Pulse regular and within target range for patient.Marland Kitchen Respirations regular, non-labored and within target range.. Temperature is normal and within the target range for the patient.Marland Kitchen Appears in no distress. Notes Wound exam On the left his wound on the left medial ankle actually looks better. Healthy granulation and rims of epithelialization On the right had absolutely nothing looks any better. He has a deep tissue injury on the right heel that looks threatening but not open yet. He has a large necrotic wound on the right lateral foot, smaller areas on the right dorsal and medial ankle and the second and fourth toes. I used a #15 scalpel and pickups to take necrotic debris off the surface of the lateral foot wound and then a 5 curette to obtain a tissue sample for PCR culture Electronic Signature(s) Signed: 05/11/2020 8:09:40 AM By: Baltazar Najjar MD Entered By: Baltazar Najjar on 05/05/2020 09:57:45 -------------------------------------------------------------------------------- Physician Orders Details Patient Name: Date of Service: [name] 05/05/2020 8:45 A M Medical Record Number: [id number] Patient Account Number: [id number] Date of Birth/Sex: Treating RN: 05-Sep-1960 (59 y.o. 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 Bathing/ Shower/ Hygiene May shower with protection but do not get wound dressing(s) wet. Edema Control - Lymphedema / SCD / Other Bilateral Lower Extremities Elevate legs to the level of the heart or above for 30 minutes daily and/or when sitting, a frequency of: Off-Loading Heel suspension boot to: - heel protectors at all times Other: - avoid any pressure to back of heels Home Health No change in wound care orders this week; continue Home Health for wound care. May utilize formulary equivalent dressing for wound treatment orders unless otherwise specified. Other Home Health Orders/Instructions: - [facility]ound [facility] Wound #40 - Lower Leg Wound Laterality: Left, Medial Cleanser: Wound Cleanser (Home Health) 2 x Per Week/30 Days Discharge Instructions: Cleanse the wound with wound cleanser prior to applying a clean  The patient is in a lot of pain 12/10; the patient comes in with a new deep tissue injury on the right heel large necrotic wound on the right lateral foot, smaller necrotic wound on the dorsal ankle and medial ankle. Necrotic wounds on the second and fourth toes. We have been using Hydrofera Blue On the left he has the area on the left little medial ankle which actually looks some better healthier looking tissue. We are using Hydrofera Blue here as well X-ray ordered last week showed no osseous erosion no plain film indication of osteomyelitis. Electronic Signature(s) Signed: 05/11/2020 8:09:40 AM By: Baltazar Najjar MD Entered By: Baltazar Najjar on 05/05/2020 09:55:51 -------------------------------------------------------------------------------- Physical Exam Details Patient  Name: Date of Service: [name], [name] 05/05/2020 8:45 A M Medical Record Number: [id number] Patient Account Number: [id number] Date of Birth/Sex: Treating RN: 10/06/1960 (59 y.o. [name] Primary Care Provider: [name] Other Clinician: Referring Provider: Treating Provider/Extender: [name], [name] in Treatment: 6 Constitutional Sitting or standing Blood Pressure is within target range for patient.. Pulse regular and within target range for patient.Marland Kitchen Respirations regular, non-labored and within target range.. Temperature is normal and within the target range for the patient.Marland Kitchen Appears in no distress. Notes Wound exam On the left his wound on the left medial ankle actually looks better. Healthy granulation and rims of epithelialization On the right had absolutely nothing looks any better. He has a deep tissue injury on the right heel that looks threatening but not open yet. He has a large necrotic wound on the right lateral foot, smaller areas on the right dorsal and medial ankle and the second and fourth toes. I used a #15 scalpel and pickups to take necrotic debris off the surface of the lateral foot wound and then a 5 curette to obtain a tissue sample for PCR culture Electronic Signature(s) Signed: 05/11/2020 8:09:40 AM By: Baltazar Najjar MD Entered By: Baltazar Najjar on 05/05/2020 09:57:45 -------------------------------------------------------------------------------- Physician Orders Details Patient Name: Date of Service: [name] 05/05/2020 8:45 A M Medical Record Number: [id number] Patient Account Number: [id number] Date of Birth/Sex: Treating RN: 05-Sep-1960 (59 y.o. 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 Bathing/ Shower/ Hygiene May shower with protection but do not get wound dressing(s) wet. Edema Control - Lymphedema / SCD / Other Bilateral Lower Extremities Elevate legs to the level of the heart or above for 30 minutes daily and/or when sitting, a frequency of: Off-Loading Heel suspension boot to: - heel protectors at all times Other: - avoid any pressure to back of heels Home Health No change in wound care orders this week; continue Home Health for wound care. May utilize formulary equivalent dressing for wound treatment orders unless otherwise specified. Other Home Health Orders/Instructions: - [facility] Wound #40 - Lower Leg Wound Laterality: Left, Medial Cleanser: Wound Cleanser (Home Health) 2 x Per Week/30 Days Discharge Instructions: Cleanse the wound with wound cleanser prior to applying a clean  05/05/2020 5:15:15 PM Version By: Zenaida Deed RN, BSN Entered By: Zenaida Deed on 05/09/2020  10:02:45 -------------------------------------------------------------------------------- Problem List Details Patient Name: Date of Service: [name], [name] 05/05/2020 8:45 A M Medical Record Number: [id number] Patient Account Number: [id number] Date of Birth/Sex: Treating RN: [birth year] (59 y.o. [name] Primary Care Provider: [name] Other Clinician: Referring Provider: Treating Provider/Extender: [name], [name] in Treatment: 6 Active Problems ICD-10 Encounter Code Description Active Date MDM Diagnosis I89.0 Lymphedema, not elsewhere classified 03/23/2020 No Yes T81.31XD Disruption of external operation (surgical) wound, not elsewhere classified, 03/23/2020 No Yes subsequent encounter S90.811D Abrasion, right foot, subsequent encounter 03/23/2020 No Yes L97.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 layer exposed 03/23/2020 No Yes L97.312 Non-pressure chronic ulcer of right 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 03/23/2020 No Yes L97.322 Non-pressure chronic ulcer of left ankle with fat layer exposed 03/23/2020 No Yes L89.616 Pressure-induced deep tissue damage of right heel 05/05/2020 No Yes Inactive Problems ICD-10 Code Description Active Date Inactive Date L03.115 Cellulitis of right lower limb 04/07/2020 04/07/2020 Resolved Problems Electronic Signature(s) Signed: 05/11/2020 8:09:40 AM By: Baltazar Najjar MD Entered By: Baltazar Najjar on 05/05/2020 09:53:26 -------------------------------------------------------------------------------- Progress Note Details Patient Name: Date of Service: [name] 05/05/2020 8:45 A M Medical Record Number: [id number] Patient Account Number: [id number] Date of Birth/Sex: Treating RN: Jun 16, 1960 (59 y.o. [name] Primary Care Provider: [name] Other Clinician: Referring Provider: Treating Provider/Extender: [name],  [name] in Treatment: 6 Subjective History of Present Illness (HPI) 11/23/15; this is a patient I remember from a previous stay in this clinic in 2012. The patient says this was for a wound in this same area as currently although I have no information to verify that. Most of the information is provided by his caregiver from the group home where he resides. Apparently he has had a wound in this area for 8 months. He also has erythema around this area and has had multiple rounds of antibiotics apparently with some improvement but the erythema is persists. He apparently had a fracture in the ankle 2 months ago and was seen by Dr. Lajoyce Corners . He apparently had a splint applied and more recently a heel offloading boot. He had home health coming out to a week ago not clear what they are dressing this with but they apparently discharged him perhaps because of one of Dr. 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 He has a Nurse, adult for transferring at home he is nonambulatory and does not wear footwear. He is not a diabetic. Looking through [facility] he appears to have fairly active primary care. The area on the ankle is variably labeled as a pressure area and/or chronic venous insufficiency. He had a recent venous ultrasound on 5/25 that did not show a DVT in the left leg however this was not a reflux study. There was no superficial DVT either. Arterial studies on 10/15/14 showed a left ABI of 1.16 Center right of 1.12 waveforms were triphasic he does not have significant arterial disease. He has had recent x-rays of the left foot and ankle. The left foot did not show any abnormality. Left ankle x-ray showed a spiral fracture of the left tibial diaphysis without evidence of osteomyelitis. 11/30/15 culture I did last week showed pseudomonas I changed him to oral ciprofloxacin. Erythema is a lot better. 12/07/15 area around the wound shows considerable improvement of  the periwound erythema. 12/14/15;  05/05/2020 5:15:15 PM Version By: Zenaida Deed RN, BSN Entered By: Zenaida Deed on 05/09/2020  10:02:45 -------------------------------------------------------------------------------- Problem List Details Patient Name: Date of Service: [name], [name] 05/05/2020 8:45 A M Medical Record Number: [id number] Patient Account Number: [id number] Date of Birth/Sex: Treating RN: [birth year] (59 y.o. [name] Primary Care Provider: [name] Other Clinician: Referring Provider: Treating Provider/Extender: [name], [name] in Treatment: 6 Active Problems ICD-10 Encounter Code Description Active Date MDM Diagnosis I89.0 Lymphedema, not elsewhere classified 03/23/2020 No Yes T81.31XD Disruption of external operation (surgical) wound, not elsewhere classified, 03/23/2020 No Yes subsequent encounter S90.811D Abrasion, right foot, subsequent encounter 03/23/2020 No Yes L97.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 layer exposed 03/23/2020 No Yes L97.312 Non-pressure chronic ulcer of right ankle with fat layer exposed 03/23/2020 No Yes L97.322 Non-pressure chronic ulcer of left ankle with fat layer exposed 03/23/2020 No Yes L89.616 Pressure-induced deep tissue damage of right heel 05/05/2020 No Yes Inactive Problems ICD-10 Code Description Active Date Inactive Date L03.115 Cellulitis of right lower limb 04/07/2020 04/07/2020 Resolved Problems Electronic Signature(s) Signed: 05/11/2020 8:09:40 AM By: Baltazar Najjar MD Entered By: Baltazar Najjar on 05/05/2020 09:53:26 -------------------------------------------------------------------------------- Progress Note Details Patient Name: Date of Service: [name] 05/05/2020 8:45 A M Medical Record Number: [id number] Patient Account Number: [id number] Date of Birth/Sex: Treating RN: Jun 16, 1960 (59 y.o. [name] Primary Care Provider: [name] Other Clinician: Referring Provider: Treating Provider/Extender: [name],  [name] in Treatment: 6 Subjective History of Present Illness (HPI) 11/23/15; this is a patient I remember from a previous stay in this clinic in 2012. The patient says this was for a wound in this same area as currently although I have no information to verify that. Most of the information is provided by his caregiver from the group home where he resides. Apparently he has had a wound in this area for 8 months. He also has erythema around this area and has had multiple rounds of antibiotics apparently with some improvement but the erythema is persists. He apparently had a fracture in the ankle 2 months ago and was seen by Dr. Lajoyce Corners . He apparently had a splint applied and more recently a heel offloading boot. He had home health coming out to a week ago not clear what they are dressing this with but they apparently discharged him perhaps because of one of Dr. 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 He has a Nurse, adult for transferring at home he is nonambulatory and does not wear footwear. He is not a diabetic. Looking through [facility] he appears to have fairly active primary care. The area on the ankle is variably labeled as a pressure area and/or chronic venous insufficiency. He had a recent venous ultrasound on 5/25 that did not show a DVT in the left leg however this was not a reflux study. There was no superficial DVT either. Arterial studies on 10/15/14 showed a left ABI of 1.16 Center right of 1.12 waveforms were triphasic he does not have significant arterial disease. He has had recent x-rays of the left foot and ankle. The left foot did not show any abnormality. Left ankle x-ray showed a spiral fracture of the left tibial diaphysis without evidence of osteomyelitis. 11/30/15 culture I did last week showed pseudomonas I changed him to oral ciprofloxacin. Erythema is a lot better. 12/07/15 area around the wound shows considerable improvement of  the periwound erythema. 12/14/15;  The patient is in a lot of pain 12/10; the patient comes in with a new deep tissue injury on the right heel large necrotic wound on the right lateral foot, smaller necrotic wound on the dorsal ankle and medial ankle. Necrotic wounds on the second and fourth toes. We have been using Hydrofera Blue On the left he has the area on the left little medial ankle which actually looks some better healthier looking tissue. We are using Hydrofera Blue here as well X-ray ordered last week showed no osseous erosion no plain film indication of osteomyelitis. Electronic Signature(s) Signed: 05/11/2020 8:09:40 AM By: Baltazar Najjar MD Entered By: Baltazar Najjar on 05/05/2020 09:55:51 -------------------------------------------------------------------------------- Physical Exam Details Patient  Name: Date of Service: [name], [name] 05/05/2020 8:45 A M Medical Record Number: [id number] Patient Account Number: [id number] Date of Birth/Sex: Treating RN: 10/06/1960 (59 y.o. [name] Primary Care Provider: [name] Other Clinician: Referring Provider: Treating Provider/Extender: [name], [name] in Treatment: 6 Constitutional Sitting or standing Blood Pressure is within target range for patient.. Pulse regular and within target range for patient.Marland Kitchen Respirations regular, non-labored and within target range.. Temperature is normal and within the target range for the patient.Marland Kitchen Appears in no distress. Notes Wound exam On the left his wound on the left medial ankle actually looks better. Healthy granulation and rims of epithelialization On the right had absolutely nothing looks any better. He has a deep tissue injury on the right heel that looks threatening but not open yet. He has a large necrotic wound on the right lateral foot, smaller areas on the right dorsal and medial ankle and the second and fourth toes. I used a #15 scalpel and pickups to take necrotic debris off the surface of the lateral foot wound and then a 5 curette to obtain a tissue sample for PCR culture Electronic Signature(s) Signed: 05/11/2020 8:09:40 AM By: Baltazar Najjar MD Entered By: Baltazar Najjar on 05/05/2020 09:57:45 -------------------------------------------------------------------------------- Physician Orders Details Patient Name: Date of Service: [name] 05/05/2020 8:45 A M Medical Record Number: [id number] Patient Account Number: [id number] Date of Birth/Sex: Treating RN: 05-Sep-1960 (59 y.o. 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 Bathing/ Shower/ Hygiene May shower with protection but do not get wound dressing(s) wet. Edema Control - Lymphedema / SCD / Other Bilateral Lower Extremities Elevate legs to the level of the heart or above for 30 minutes daily and/or when sitting, a frequency of: Off-Loading Heel suspension boot to: - heel protectors at all times Other: - avoid any pressure to back of heels Home Health No change in wound care orders this week; continue Home Health for wound care. May utilize formulary equivalent dressing for wound treatment orders unless otherwise specified. Other Home Health Orders/Instructions: - [facility] Wound Treatment Wound #40 - Lower Leg Wound Laterality: Left, Medial Cleanser: Wound Cleanser (Home Health) 2 x Per Week/30 Days Discharge Instructions: Cleanse the wound with wound cleanser prior to applying a clean  05/05/2020 5:15:15 PM Version By: Zenaida Deed RN, BSN Entered By: Zenaida Deed on 05/09/2020  10:02:45 -------------------------------------------------------------------------------- Problem List Details Patient Name: Date of Service: [name], [name] 05/05/2020 8:45 A M Medical Record Number: [id number] Patient Account Number: [id number] Date of Birth/Sex: Treating RN: [birth year] (59 y.o. [name] Primary Care Provider: [name] Other Clinician: Referring Provider: Treating Provider/Extender: [name], [name] in Treatment: 6 Active Problems ICD-10 Encounter Code Description Active Date MDM Diagnosis I89.0 Lymphedema, not elsewhere classified 03/23/2020 No Yes T81.31XD Disruption of external operation (surgical) wound, not elsewhere classified, 03/23/2020 No Yes subsequent encounter S90.811D Abrasion, right foot, subsequent encounter 03/23/2020 No Yes L97.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 layer exposed 03/23/2020 No Yes L97.312 Non-pressure chronic ulcer of right ankle with fat layer exposed 03/23/2020 No Yes L97.322 Non-pressure chronic ulcer of left ankle with fat layer exposed 03/23/2020 No Yes L89.616 Pressure-induced deep tissue damage of right heel 05/05/2020 No Yes Inactive Problems ICD-10 Code Description Active Date Inactive Date L03.115 Cellulitis of right lower limb 04/07/2020 04/07/2020 Resolved Problems Electronic Signature(s) Signed: 05/11/2020 8:09:40 AM By: Baltazar Najjar MD Entered By: Baltazar Najjar on 05/05/2020 09:53:26 -------------------------------------------------------------------------------- Progress Note Details Patient Name: Date of Service: [name] 05/05/2020 8:45 A M Medical Record Number: [id number] Patient Account Number: [id number] Date of Birth/Sex: Treating RN: Jun 16, 1960 (59 y.o. [name] Primary Care Provider: [name] Other Clinician: Referring Provider: Treating Provider/Extender: [name],  [name] in Treatment: 6 Subjective History of Present Illness (HPI) 11/23/15; this is a patient I remember from a previous stay in this clinic in 2012. The patient says this was for a wound in this same area as currently although I have no information to verify that. Most of the information is provided by his caregiver from the group home where he resides. Apparently he has had a wound in this area for 8 months. He also has erythema around this area and has had multiple rounds of antibiotics apparently with some improvement but the erythema is persists. He apparently had a fracture in the ankle 2 months ago and was seen by Dr. Lajoyce Corners . He apparently had a splint applied and more recently a heel offloading boot. He had home health coming out to a week ago not clear what they are dressing this with but they apparently discharged him perhaps because of one of Dr. 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 He has a Nurse, adult for transferring at home he is nonambulatory and does not wear footwear. He is not a diabetic. Looking [facility] he appears to have fairly active primary care. The area on the ankle is variably labeled as a pressure area and/or chronic venous insufficiency. He had a recent venous ultrasound on 5/25 that did not show a DVT in the left leg however this was not a reflux study. There was no superficial DVT either. Arterial studies on 10/15/14 showed a left ABI of 1.16 Center right of 1.12 waveforms were triphasic he does not have significant arterial disease. He has had recent x-rays of the left foot and ankle. The left foot did not show any abnormality. Left ankle x-ray showed a spiral fracture of the left tibial diaphysis without evidence of osteomyelitis. 11/30/15 culture I did last week showed pseudomonas I changed him to oral ciprofloxacin. Erythema is a lot better. 12/07/15 area around the wound shows considerable improvement of  the periwound erythema. 12/14/15;  The patient is in a lot of pain 12/10; the patient comes in with a new deep tissue injury on the right heel large necrotic wound on the right lateral foot, smaller necrotic wound on the dorsal ankle and medial ankle. Necrotic wounds on the second and fourth toes. We have been using Hydrofera Blue On the left he has the area on the left little medial ankle which actually looks some better healthier looking tissue. We are using Hydrofera Blue here as well X-ray ordered last week showed no osseous erosion no plain film indication of osteomyelitis. Electronic Signature(s) Signed: 05/11/2020 8:09:40 AM By: Baltazar Najjar MD Entered By: Baltazar Najjar on 05/05/2020 09:55:51 -------------------------------------------------------------------------------- Physical Exam Details Patient  Name: Date of Service: [name], [name] 05/05/2020 8:45 A M Medical Record Number: [id number] Patient Account Number: [id number] Date of Birth/Sex: Treating RN: 10/06/1960 (59 y.o. [name] Primary Care Provider: [name] Other Clinician: Referring Provider: Treating Provider/Extender: [name], [name] in Treatment: 6 Constitutional Sitting or standing Blood Pressure is within target range for patient.. Pulse regular and within target range for patient.Marland Kitchen Respirations regular, non-labored and within target range.. Temperature is normal and within the target range for the patient.Marland Kitchen Appears in no distress. Notes Wound exam On the left his wound on the left medial ankle actually looks better. Healthy granulation and rims of epithelialization On the right had absolutely nothing looks any better. He has a deep tissue injury on the right heel that looks threatening but not open yet. He has a large necrotic wound on the right lateral foot, smaller areas on the right dorsal and medial ankle and the second and fourth toes. I used a #15 scalpel and pickups to take necrotic debris off the surface of the lateral foot wound and then a 5 curette to obtain a tissue sample for PCR culture Electronic Signature(s) Signed: 05/11/2020 8:09:40 AM By: Baltazar Najjar MD Entered By: Baltazar Najjar on 05/05/2020 09:57:45 -------------------------------------------------------------------------------- Physician Orders Details Patient Name: Date of Service: [name] 05/05/2020 8:45 A M Medical Record Number: [id number] Patient Account Number: [id number] Date of Birth/Sex: Treating RN: 05-Sep-1960 (59 y.o. 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 Bathing/ Shower/ Hygiene May shower with protection but do not get wound dressing(s) wet. Edema Control - Lymphedema / SCD / Other Bilateral Lower Extremities Elevate legs to the level of the heart or above for 30 minutes daily and/or when sitting, a frequency of: Off-Loading Heel suspension boot to: - heel protectors at all times Other: - avoid any pressure to back of heels Home Health No change in wound care orders this week; continue Home Health for wound care. May utilize formulary equivalent dressing for wound treatment orders unless otherwise specified. Other Home Health Orders/Instructions: - [facility] Wound Treatment Wound #40 - Lower Leg Wound Laterality: Left, Medial Cleanser: Wound Cleanser (Home Health) 2 x Per Week/30 Days Discharge Instructions: Cleanse the wound with wound cleanser prior to applying a clean  The patient is in a lot of pain 12/10; the patient comes in with a new deep tissue injury on the right heel large necrotic wound on the right lateral foot, smaller necrotic wound on the dorsal ankle and medial ankle. Necrotic wounds on the second and fourth toes. We have been using Hydrofera Blue On the left he has the area on the left little medial ankle which actually looks some better healthier looking tissue. We are using Hydrofera Blue here as well X-ray ordered last week showed no osseous erosion no plain film indication of osteomyelitis. Electronic Signature(s) Signed: 05/11/2020 8:09:40 AM By: Baltazar Najjar MD Entered By: Baltazar Najjar on 05/05/2020 09:55:51 -------------------------------------------------------------------------------- Physical Exam Details Patient  Name: Date of Service: [name], [name] 05/05/2020 8:45 A M Medical Record Number: [id number] Patient Account Number: [id number] Date of Birth/Sex: Treating RN: 10/06/1960 (59 y.o. [name] Primary Care Provider: [name] Other Clinician: Referring Provider: Treating Provider/Extender: [name], [name] in Treatment: 6 Constitutional Sitting or standing Blood Pressure is within target range for patient.. Pulse regular and within target range for patient.Marland Kitchen Respirations regular, non-labored and within target range.. Temperature is normal and within the target range for the patient.Marland Kitchen Appears in no distress. Notes Wound exam On the left his wound on the left medial ankle actually looks better. Healthy granulation and rims of epithelialization On the right had absolutely nothing looks any better. He has a deep tissue injury on the right heel that looks threatening but not open yet. He has a large necrotic wound on the right lateral foot, smaller areas on the right dorsal and medial ankle and the second and fourth toes. I used a #15 scalpel and pickups to take necrotic debris off the surface of the lateral foot wound and then a 5 curette to obtain a tissue sample for PCR culture Electronic Signature(s) Signed: 05/11/2020 8:09:40 AM By: Baltazar Najjar MD Entered By: Baltazar Najjar on 05/05/2020 09:57:45 -------------------------------------------------------------------------------- Physician Orders Details Patient Name: Date of Service: [name] 05/05/2020 8:45 A M Medical Record Number: [id number] Patient Account Number: [id number] Date of Birth/Sex: Treating RN: 05-Sep-1960 (59 y.o. 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 Bathing/ Shower/ Hygiene May shower with protection but do not get wound dressing(s) wet. Edema Control - Lymphedema / SCD / Other Bilateral Lower Extremities Elevate legs to the level of the heart or above for 30 minutes daily and/or when sitting, a frequency of: Off-Loading Heel suspension boot to: - heel protectors at all times Other: - avoid any pressure to back of heels Home Health No change in wound care orders this week; continue Home Health for wound care. May utilize formulary equivalent dressing for wound treatment orders unless otherwise specified. Other Home Health Orders/Instructions: - [facility] Wound Treatment Wound #40 - Lower Leg Wound Laterality: Left, Medial Cleanser: Wound Cleanser (Home Health) 2 x Per Week/30 Days Discharge Instructions: Cleanse the wound with wound cleanser prior to applying a clean

## 2020-05-11 NOTE — Progress Notes (Signed)
needed, State content correctly Wound/Skin Impairment: Methods: Explain/Verbal Responses: Reinforcements needed, State content correctly Electronic Signature(s) Signed: 05/05/2020 5:15:15 PM By: Zenaida DeedBoehlein, Linda RN, BSN Entered By: Zenaida DeedBoehlein, Linda on 05/05/2020 09:33:44 -------------------------------------------------------------------------------- Wound Assessment Details Patient Name: Date of Service: [name], [name] 05/05/2020 8:45 A M Medical Record Number: [id number] Patient Account Number: [id number] Date of Birth/Sex: Treating RN: 11/09/1960 (59 y.o. Judie PetitM) Yevonne PaxEpps, Carrie Primary Care Khailee Mick: Jamison OkaBakare, Mobolaji Other Clinician: Referring Juwuan Sedita: Treating Valari Taylor/Extender: Alethia Bertholdobson, Michael Bakare, [name] in Treatment: 6 Wound Status Wound Number: 40 Primary Venous Leg Ulcer Etiology: Wound Location: Left, Medial Lower Leg Wound Open Wounding Event: Gradually Appeared Status: Date Acquired: 02/21/2020 Comorbid Anemia, Deep Vein Thrombosis, Colitis, Osteomyelitis, Weeks Of Treatment: 6 History: Neuropathy, Quadriplegia, Confinement Anxiety Clustered Wound: No Wound Measurements Length: (cm) 3.5 Width: (cm) 4 Depth: (cm) 0.2 Area: (cm) 10.996 Volume: (cm) 2.199 % Reduction in Area: 49% % Reduction in Volume: -2% Epithelialization: Small  (1-33%) Tunneling: No Undermining: No Wound Description Classification: Full Thickness Without Exposed Support Structures Wound Margin: Flat and Intact Exudate Amount: Large Exudate Type: Serosanguineous Exudate Color: red, brown Foul Odor After Cleansing: No Slough/Fibrino Yes Wound Bed Granulation Amount: Medium (34-66%) Exposed Structure Granulation Quality: Red, Friable Fascia Exposed: No Necrotic Amount: Medium (34-66%) Fat Layer (Subcutaneous Tissue) Exposed: Yes Necrotic Quality: Adherent Slough Tendon Exposed: No Muscle Exposed: No Joint Exposed: No Bone Exposed: No Treatment Notes Wound #40 (Lower Leg) Wound Laterality: Left, Medial Cleanser Wound Cleanser Discharge Instruction: Cleanse the wound with wound cleanser prior to applying a clean dressing using gauze sponges, not tissue or cotton balls. Peri-Wound Care Sween Lotion (Moisturizing lotion) Discharge Instruction: Apply moisturizing lotion with dressing changes Topical Primary Dressing Hydrofera Blue Classic Foam, 4x4 in Discharge Instruction: Apply to wound bed as instructed Secondary Dressing ABD Pad, 5x9 Discharge Instruction: Apply over primary dressing as directed. Secured With Compression Wrap Kerlix Roll 4.5x3.1 (in/yd) Discharge Instruction: Apply Kerlix and Coban compression , be sure to wrap from base of toes to tibial tuberosity. no too tight Coban Self-Adherent Wrap 4x5 (in/yd) Discharge Instruction: Apply over Kerlix as directed. Compression Stockings Add-Ons Electronic Signature(s) Signed: 05/05/2020 2:11:24 PM By: Yevonne PaxEpps, Carrie RN Entered By: Yevonne PaxEpps, Carrie on 05/05/2020 09:20:30 -------------------------------------------------------------------------------- Wound Assessment Details Patient Name: Date of Service: [name] RhineHUMBLE, [name] 05/05/2020 8:45 A M Medical Record Number: [id number] Patient Account Number: [id number] Date of Birth/Sex: Treating RN: 11/09/1960 (59 y.o. Judie PetitM) Yevonne PaxEpps,  Carrie Primary Care Drayden Lukas: Jamison OkaBakare, Mobolaji Other Clinician: Referring Kyshaun Barnette: Treating Chester Sibert/Extender: Alethia Bertholdobson, Michael Bakare, [name] in Treatment: 6 Wound Status Wound Number: 41 Primary Lymphedema Etiology: Wound Location: Right T Fourth oe Wound Open Wounding Event: Trauma Status: Date Acquired: 02/21/2020 Comorbid Anemia, Deep Vein Thrombosis, Colitis, Osteomyelitis, Weeks Of Treatment: 6 Weeks Of Treatment: 6 History: Neuropathy, Quadriplegia, Confinement Anxiety Clustered Wound: No Wound Measurements Length: (cm) 2.7 Width: (cm) 1.2 Depth: (cm) 0.3 Area: (cm) 2.545 Volume: (cm) 0.763 % Reduction in Area: -80% % Reduction in Volume: -441.1% Epithelialization: None Tunneling: No Undermining: No Wound Description Classification: Full Thickness Without Exposed Support Structures Wound Margin: Flat and Intact Exudate Amount: Medium Exudate Type: Serosanguineous Exudate Color: red, brown Foul Odor After Cleansing: No Slough/Fibrino Yes Wound Bed Granulation Amount: Medium (34-66%) Exposed Structure Granulation Quality: Pink Fascia Exposed: No Necrotic Amount: Medium (34-66%) Fat Layer (Subcutaneous Tissue) Exposed: Yes Necrotic Quality: Adherent Slough Tendon Exposed: No Muscle Exposed: No Joint Exposed: No Bone Exposed: No Treatment Notes Wound #41 (Toe Fourth) Wound Laterality: Right Cleanser Wound Cleanser Discharge Instruction: Cleanse the  needed, State content correctly Wound/Skin Impairment: Methods: Explain/Verbal Responses: Reinforcements needed, State content correctly Electronic Signature(s) Signed: 05/05/2020 5:15:15 PM By: Zenaida DeedBoehlein, Linda RN, BSN Entered By: Zenaida DeedBoehlein, Linda on 05/05/2020 09:33:44 -------------------------------------------------------------------------------- Wound Assessment Details Patient Name: Date of Service: [name], [name] 05/05/2020 8:45 A M Medical Record Number: [id number] Patient Account Number: [id number] Date of Birth/Sex: Treating RN: 11/09/1960 (59 y.o. Judie PetitM) Yevonne PaxEpps, Carrie Primary Care Khailee Mick: Jamison OkaBakare, Mobolaji Other Clinician: Referring Juwuan Sedita: Treating Valari Taylor/Extender: Alethia Bertholdobson, Michael Bakare, [name] in Treatment: 6 Wound Status Wound Number: 40 Primary Venous Leg Ulcer Etiology: Wound Location: Left, Medial Lower Leg Wound Open Wounding Event: Gradually Appeared Status: Date Acquired: 02/21/2020 Comorbid Anemia, Deep Vein Thrombosis, Colitis, Osteomyelitis, Weeks Of Treatment: 6 History: Neuropathy, Quadriplegia, Confinement Anxiety Clustered Wound: No Wound Measurements Length: (cm) 3.5 Width: (cm) 4 Depth: (cm) 0.2 Area: (cm) 10.996 Volume: (cm) 2.199 % Reduction in Area: 49% % Reduction in Volume: -2% Epithelialization: Small  (1-33%) Tunneling: No Undermining: No Wound Description Classification: Full Thickness Without Exposed Support Structures Wound Margin: Flat and Intact Exudate Amount: Large Exudate Type: Serosanguineous Exudate Color: red, brown Foul Odor After Cleansing: No Slough/Fibrino Yes Wound Bed Granulation Amount: Medium (34-66%) Exposed Structure Granulation Quality: Red, Friable Fascia Exposed: No Necrotic Amount: Medium (34-66%) Fat Layer (Subcutaneous Tissue) Exposed: Yes Necrotic Quality: Adherent Slough Tendon Exposed: No Muscle Exposed: No Joint Exposed: No Bone Exposed: No Treatment Notes Wound #40 (Lower Leg) Wound Laterality: Left, Medial Cleanser Wound Cleanser Discharge Instruction: Cleanse the wound with wound cleanser prior to applying a clean dressing using gauze sponges, not tissue or cotton balls. Peri-Wound Care Sween Lotion (Moisturizing lotion) Discharge Instruction: Apply moisturizing lotion with dressing changes Topical Primary Dressing Hydrofera Blue Classic Foam, 4x4 in Discharge Instruction: Apply to wound bed as instructed Secondary Dressing ABD Pad, 5x9 Discharge Instruction: Apply over primary dressing as directed. Secured With Compression Wrap Kerlix Roll 4.5x3.1 (in/yd) Discharge Instruction: Apply Kerlix and Coban compression , be sure to wrap from base of toes to tibial tuberosity. no too tight Coban Self-Adherent Wrap 4x5 (in/yd) Discharge Instruction: Apply over Kerlix as directed. Compression Stockings Add-Ons Electronic Signature(s) Signed: 05/05/2020 2:11:24 PM By: Yevonne PaxEpps, Carrie RN Entered By: Yevonne PaxEpps, Carrie on 05/05/2020 09:20:30 -------------------------------------------------------------------------------- Wound Assessment Details Patient Name: Date of Service: [name] RhineHUMBLE, [name] 05/05/2020 8:45 A M Medical Record Number: [id number] Patient Account Number: [id number] Date of Birth/Sex: Treating RN: 11/09/1960 (59 y.o. Judie PetitM) Yevonne PaxEpps,  Carrie Primary Care Drayden Lukas: Jamison OkaBakare, Mobolaji Other Clinician: Referring Kyshaun Barnette: Treating Chester Sibert/Extender: Alethia Bertholdobson, Michael Bakare, [name] in Treatment: 6 Wound Status Wound Number: 41 Primary Lymphedema Etiology: Wound Location: Right T Fourth oe Wound Open Wounding Event: Trauma Status: Date Acquired: 02/21/2020 Comorbid Anemia, Deep Vein Thrombosis, Colitis, Osteomyelitis, Weeks Of Treatment: 6 Weeks Of Treatment: 6 History: Neuropathy, Quadriplegia, Confinement Anxiety Clustered Wound: No Wound Measurements Length: (cm) 2.7 Width: (cm) 1.2 Depth: (cm) 0.3 Area: (cm) 2.545 Volume: (cm) 0.763 % Reduction in Area: -80% % Reduction in Volume: -441.1% Epithelialization: None Tunneling: No Undermining: No Wound Description Classification: Full Thickness Without Exposed Support Structures Wound Margin: Flat and Intact Exudate Amount: Medium Exudate Type: Serosanguineous Exudate Color: red, brown Foul Odor After Cleansing: No Slough/Fibrino Yes Wound Bed Granulation Amount: Medium (34-66%) Exposed Structure Granulation Quality: Pink Fascia Exposed: No Necrotic Amount: Medium (34-66%) Fat Layer (Subcutaneous Tissue) Exposed: Yes Necrotic Quality: Adherent Slough Tendon Exposed: No Muscle Exposed: No Joint Exposed: No Bone Exposed: No Treatment Notes Wound #41 (Toe Fourth) Wound Laterality: Right Cleanser Wound Cleanser Discharge Instruction: Cleanse the  well N/A Debridement Treatment Response: N/A 7x4.6x0.8 N/A Post Debridement Measurements L x W x D (cm) N/A 20.232 N/A Post Debridement Volume: (cm) N/A Category/Stage III N/A Post Debridement Stage: N/A Debridement N/A Procedures Performed: Treatment  Notes Electronic Signature(s) Signed: 05/05/2020 5:15:15 PM By: Zenaida Deed RN, BSN Signed: 05/11/2020 8:09:40 AM By: Baltazar Najjar MD Entered By: Baltazar Najjar on 05/05/2020 09:53:33 -------------------------------------------------------------------------------- Multi-Disciplinary Care Plan Details Patient Name: Date of Service: [name], [name] 05/05/2020 8:45 A M Medical Record Number: [id number] Patient Account Number: [id number] Date of Birth/Sex: Treating RN: [birth year] (59 y.o. [name] Primary Care Marialena Wollen: [name] Other Clinician: Referring Ziggy Reveles: Treating [name]/Extender: [name], [name] in Treatment: 6 Active Inactive Nutrition Nursing Diagnoses: Potential for alteratiion in Nutrition/Potential for imbalanced nutrition Goals: Patient/caregiver agrees to and verbalizes understanding of need to obtain nutritional consultation Date Initiated: 03/23/2020 Target Resolution Date: 05/12/2020 Goal Status: Active Interventions: Provide education on nutrition Treatment Activities: Education provided on Nutrition : 04/13/2020 Patient referred to Primary Care Physician for further nutritional evaluation : 03/23/2020 Notes: Wound/Skin Impairment Nursing Diagnoses: Knowledge deficit related to ulceration/compromised skin integrity Goals: Patient/caregiver will verbalize understanding of skin care regimen Date Initiated: 03/23/2020 Target Resolution Date: 06/22/2020 Goal Status: Active Ulcer/skin breakdown will have a volume reduction of 30% by week 4 Date Initiated: 03/31/2020 Date Inactivated: 04/28/2020 Target Resolution Date: 04/21/2020 Goal Status: Unmet Unmet Reason: infection, Ulcer/skin breakdown will have a volume reduction of 50% by week 8 Date Initiated: 04/28/2020 Target Resolution Date: 05/19/2020 Goal Status: Active Interventions: Assess patient/caregiver ability to perform ulcer/skin care regimen upon  admission and as needed Provide education on ulcer and skin care Treatment Activities: Skin care regimen initiated : 03/23/2020 Topical wound management initiated : 03/23/2020 Notes: Electronic Signature(s) Signed: 05/05/2020 5:15:15 PM By: Zenaida Deed RN, BSN Entered By: Zenaida Deed on 05/05/2020 08:59:33 -------------------------------------------------------------------------------- Pain Assessment Details Patient Name: Date of Service: [name], [name] 05/05/2020 8:45 A M Medical Record Number: [id number] Patient Account Number: [id number] Date of Birth/Sex: Treating RN: 03-23-1961 (59 y.o. [name] Primary Care Morenike Cuff: [name] Other Clinician: Referring Kemonte Ullman: Treating Keilen Kahl/Extender: [name], [name] in Treatment: 6 Active Problems Location of Pain Severity and Description of Pain Patient Has Paino Yes Site Locations With Dressing Change: Yes Duration of the Pain. Constant / Intermittento Intermittent How Long Does it Lasto Hours: Minutes: 15 Rate the pain. Current Pain Level: 4 Worst Pain Level: 8 Least Pain Level: 0 Tolerable Pain Level: 5 Character of Pain Describe the Pain: Aching Pain Management and Medication Current Pain Management: Medication: Yes Cold Application: No Rest: Yes Massage: No Activity: No T.E.N.S.: No Heat Application: No Leg drop or elevation: No Is the Current Pain Management Adequate: Inadequate How does your wound impact your activities of daily livingo Sleep: No Bathing: No Appetite: No Relationship With Others: No Bladder Continence: No Emotions: No Bowel Continence: No Work: No Toileting: No Drive: No Dressing: No Hobbies: No Electronic Signature(s) Signed: 05/05/2020 2:11:24 PM By: [name] RN Entered By: [name] on 05/05/2020 09:01:59 -------------------------------------------------------------------------------- Patient/Caregiver Education  Details Patient Name: Date of Service: [name] 12/10/2021andnbsp8:45 A M Medical Record Number: [id number] Patient Account Number: [id number] Date of Birth/Gender: Treating RN: [birth year] (59 y.o. [name] Primary Care Physician: [name] Other Clinician: Referring Physician: Treating Physician/Extender: Lamarr Lulas in Treatment: 6 Education Assessment Education Provided To: Patient Education Topics Provided Offloading: Methods: Explain/Verbal Responses: Reinforcements needed, State content correctly Pressure: Methods: Explain/Verbal Responses: Reinforcements  well N/A Debridement Treatment Response: N/A 7x4.6x0.8 N/A Post Debridement Measurements L x W x D (cm) N/A 20.232 N/A Post Debridement Volume: (cm) N/A Category/Stage III N/A Post Debridement Stage: N/A Debridement N/A Procedures Performed: Treatment  Notes Electronic Signature(s) Signed: 05/05/2020 5:15:15 PM By: Zenaida Deed RN, BSN Signed: 05/11/2020 8:09:40 AM By: Baltazar Najjar MD Entered By: Baltazar Najjar on 05/05/2020 09:53:33 -------------------------------------------------------------------------------- Multi-Disciplinary Care Plan Details Patient Name: Date of Service: [name], [name] 05/05/2020 8:45 A M Medical Record Number: [id number] Patient Account Number: [id number] Date of Birth/Sex: Treating RN: [birth year] (59 y.o. [name] Primary Care Marialena Wollen: [name] Other Clinician: Referring Ziggy Reveles: Treating [name]/Extender: [name], [name] in Treatment: 6 Active Inactive Nutrition Nursing Diagnoses: Potential for alteratiion in Nutrition/Potential for imbalanced nutrition Goals: Patient/caregiver agrees to and verbalizes understanding of need to obtain nutritional consultation Date Initiated: 03/23/2020 Target Resolution Date: 05/12/2020 Goal Status: Active Interventions: Provide education on nutrition Treatment Activities: Education provided on Nutrition : 04/13/2020 Patient referred to Primary Care Physician for further nutritional evaluation : 03/23/2020 Notes: Wound/Skin Impairment Nursing Diagnoses: Knowledge deficit related to ulceration/compromised skin integrity Goals: Patient/caregiver will verbalize understanding of skin care regimen Date Initiated: 03/23/2020 Target Resolution Date: 06/22/2020 Goal Status: Active Ulcer/skin breakdown will have a volume reduction of 30% by week 4 Date Initiated: 03/31/2020 Date Inactivated: 04/28/2020 Target Resolution Date: 04/21/2020 Goal Status: Unmet Unmet Reason: infection, Ulcer/skin breakdown will have a volume reduction of 50% by week 8 Date Initiated: 04/28/2020 Target Resolution Date: 05/19/2020 Goal Status: Active Interventions: Assess patient/caregiver ability to perform ulcer/skin care regimen upon  admission and as needed Provide education on ulcer and skin care Treatment Activities: Skin care regimen initiated : 03/23/2020 Topical wound management initiated : 03/23/2020 Notes: Electronic Signature(s) Signed: 05/05/2020 5:15:15 PM By: Zenaida Deed RN, BSN Entered By: Zenaida Deed on 05/05/2020 08:59:33 -------------------------------------------------------------------------------- Pain Assessment Details Patient Name: Date of Service: [name], [name] 05/05/2020 8:45 A M Medical Record Number: [id number] Patient Account Number: [id number] Date of Birth/Sex: Treating RN: 03-23-1961 (59 y.o. [name] Primary Care Morenike Cuff: [name] Other Clinician: Referring Kemonte Ullman: Treating Keilen Kahl/Extender: [name], [name] in Treatment: 6 Active Problems Location of Pain Severity and Description of Pain Patient Has Paino Yes Site Locations With Dressing Change: Yes Duration of the Pain. Constant / Intermittento Intermittent How Long Does it Lasto Hours: Minutes: 15 Rate the pain. Current Pain Level: 4 Worst Pain Level: 8 Least Pain Level: 0 Tolerable Pain Level: 5 Character of Pain Describe the Pain: Aching Pain Management and Medication Current Pain Management: Medication: Yes Cold Application: No Rest: Yes Massage: No Activity: No T.E.N.S.: No Heat Application: No Leg drop or elevation: No Is the Current Pain Management Adequate: Inadequate How does your wound impact your activities of daily livingo Sleep: No Bathing: No Appetite: No Relationship With Others: No Bladder Continence: No Emotions: No Bowel Continence: No Work: No Toileting: No Drive: No Dressing: No Hobbies: No Electronic Signature(s) Signed: 05/05/2020 2:11:24 PM By: [name] RN Entered By: [name] on 05/05/2020 09:01:59 -------------------------------------------------------------------------------- Patient/Caregiver Education  Details Patient Name: Date of Service: [name] 12/10/2021andnbsp8:45 A M Medical Record Number: [id number] Patient Account Number: [id number] Date of Birth/Gender: Treating RN: [birth year] (59 y.o. [name] Primary Care Physician: [name] Other Clinician: Referring Physician: Treating Physician/Extender: Lamarr Lulas in Treatment: 6 Education Assessment Education Provided To: Patient Education Topics Provided Offloading: Methods: Explain/Verbal Responses: Reinforcements needed, State content correctly Pressure: Methods: Explain/Verbal Responses: Reinforcements  [name], [name] ([id number]) Visit Report for 05/05/2020 Arrival Information Details Patient Name: Date of Service: [name], [name] 05/05/2020 8:45 A M Medical Record Number: [id number] Patient Account Number: [id number] Date of Birth/Sex: Treating RN: [birth year] (59 y.o. [name]) [name] Primary Care Brooke Payes: [name] Other Clinician: Referring Dondre Catalfamo: Treating Primo Innis/Extender: [name], [name] in Treatment: 6 Visit Information History Since Last Visit All ordered tests and consults were completed: No Patient Arrived: Wheel Chair Added or deleted any medications: No Arrival Time: 09:00 Any new allergies or adverse reactions: No Accompanied By: caregiver Had a fall or experienced change in No Transfer Assistance: None activities of daily living that may affect Patient Identification Verified: Yes risk of falls: Secondary Verification Process Completed: Yes Signs or symptoms of abuse/neglect since last visito No Patient Requires Transmission-Based Precautions: No Hospitalized since last visit: No Patient Has Alerts: No Implantable device outside of the clinic excluding No cellular tissue based products placed in the center since last visit: Has Dressing in Place as Prescribed: Yes Has Compression in Place as Prescribed: Yes Pain Present Now: Yes Electronic Signature(s) Signed: 05/05/2020 2:11:24 PM By: [name] RN Entered By: [name] on 05/05/2020 09:00:46 -------------------------------------------------------------------------------- Encounter Discharge Information Details Patient Name: Date of Service: [name] 05/05/2020 8:45 A M Medical Record Number: [id number] Patient Account Number: [id number] Date of Birth/Sex: Treating RN: 1960/08/04 (59 y.o. [name] Primary Care Amanie Mcculley: [name] Other Clinician: Referring Avina Eberle: Treating Lamis Behrmann/Extender: [name], [name] in  Treatment: 6 Encounter Discharge Information Items Post Procedure Vitals Discharge Condition: Stable Temperature (F): 97.8 Ambulatory Status: Wheelchair Pulse (bpm): 77 Discharge Destination: Home Respiratory Rate (breaths/min): 18 Transportation: Private Auto Blood Pressure (mmHg): 104/68 Accompanied By: self Schedule Follow-up Appointment: Yes Clinical Summary of Care: Electronic Signature(s) Signed: 05/05/2020 2:12:11 PM By: Shawn Stall Entered By: Shawn Stall on 05/05/2020 11:50:53 -------------------------------------------------------------------------------- Lower Extremity Assessment Details Patient Name: Date of Service: [name], [name] 05/05/2020 8:45 A M Medical Record Number: [id number] Patient Account Number: [id number] Date of Birth/Sex: Treating RN: 30-Nov-1960 (59 y.o. [name]) [name] Primary Care Donjuan Robison: [name] Other Clinician: Referring Kayden Amend: Treating Jhoanna Heyde/Extender: [name], [name] in Treatment: 6 Edema Assessment Assessed: [Left: No] [Right: No] Edema: [Left: Yes] [Right: Yes] Calf Left: Right: Point of Measurement: From Medial Instep 22 cm 22 cm Ankle Left: Right: Point of Measurement: From Medial Instep 20 cm 21 cm Electronic Signature(s) Signed: 05/05/2020 2:11:24 PM By: [name] RN Entered By: [name] on 05/05/2020 09:18:37 -------------------------------------------------------------------------------- Multi Wound Chart Details Patient Name: Date of Service: [name] 05/05/2020 8:45 A M Medical Record Number: [id number] Patient Account Number: [id number] Date of Birth/Sex: Treating RN: 01-Sep-1960 (59 y.o. [name] Primary Care Netha Dafoe: [name] Other Clinician: Referring Seldon Barrell: Treating Shneur [place]/Extender: [name], [name] in Treatment: 6 Vital Signs Height(in): Pulse(bpm): 77 Weight(lbs): Blood Pressure(mmHg): 104/68 Body Mass  Index(BMI): Temperature(F): 97.8 Respiratory Rate(breaths/min): 18 Photos: [40:No Photos Left, Medial Lower Leg] [41:No Photos Right T Fourth oe] [42:No Photos Right, Medial Malleolus] Wound Location: [40:Gradually Appeared] [41:Trauma] [42:Gradually Appeared] Wounding Event: [40:Venous Leg Ulcer] [41:Lymphedema] [42:Venous Leg Ulcer] Primary Etiology: [40:N/A] [41:N/A] [42:N/A] Secondary Etiology: [40:Anemia, Deep Vein Thrombosis,] [41:Anemia, Deep Vein Thrombosis,] [42:Anemia, Deep Vein Thrombosis,] Comorbid History: [40:Colitis, Osteomyelitis, Neuropathy, Quadriplegia, Confinement Anxiety 02/21/2020] [41:Colitis, Osteomyelitis, Neuropathy, Quadriplegia, Confinement Anxiety 02/21/2020] [42:Colitis, Osteomyelitis, Neuropathy, Quadriplegia, Confinement  Anxiety 02/21/2020] Date Acquired: [40:6] [41:6] [42:6] Weeks of Treatment: [40:Open] [41:Open] [42:Open] Wound Status: [40:No] [41:No] [42:Yes] Clustered Wound: [40:N/A] [  [name], [name] ([id number]) Visit Report for 05/05/2020 Arrival Information Details Patient Name: Date of Service: [name], [name] 05/05/2020 8:45 A M Medical Record Number: [id number] Patient Account Number: [id number] Date of Birth/Sex: Treating RN: [birth year] (59 y.o. [name]) [name] Primary Care Brooke Payes: [name] Other Clinician: Referring Dondre Catalfamo: Treating Primo Innis/Extender: [name], [name] in Treatment: 6 Visit Information History Since Last Visit All ordered tests and consults were completed: No Patient Arrived: Wheel Chair Added or deleted any medications: No Arrival Time: 09:00 Any new allergies or adverse reactions: No Accompanied By: caregiver Had a fall or experienced change in No Transfer Assistance: None activities of daily living that may affect Patient Identification Verified: Yes risk of falls: Secondary Verification Process Completed: Yes Signs or symptoms of abuse/neglect since last visito No Patient Requires Transmission-Based Precautions: No Hospitalized since last visit: No Patient Has Alerts: No Implantable device outside of the clinic excluding No cellular tissue based products placed in the center since last visit: Has Dressing in Place as Prescribed: Yes Has Compression in Place as Prescribed: Yes Pain Present Now: Yes Electronic Signature(s) Signed: 05/05/2020 2:11:24 PM By: [name] RN Entered By: [name] on 05/05/2020 09:00:46 -------------------------------------------------------------------------------- Encounter Discharge Information Details Patient Name: Date of Service: [name] 05/05/2020 8:45 A M Medical Record Number: [id number] Patient Account Number: [id number] Date of Birth/Sex: Treating RN: 1960/08/04 (59 y.o. [name] Primary Care Amanie Mcculley: [name] Other Clinician: Referring Avina Eberle: Treating Lamis Behrmann/Extender: [name], [name] in  Treatment: 6 Encounter Discharge Information Items Post Procedure Vitals Discharge Condition: Stable Temperature (F): 97.8 Ambulatory Status: Wheelchair Pulse (bpm): 77 Discharge Destination: Home Respiratory Rate (breaths/min): 18 Transportation: Private Auto Blood Pressure (mmHg): 104/68 Accompanied By: self Schedule Follow-up Appointment: Yes Clinical Summary of Care: Electronic Signature(s) Signed: 05/05/2020 2:12:11 PM By: Shawn Stall Entered By: Shawn Stall on 05/05/2020 11:50:53 -------------------------------------------------------------------------------- Lower Extremity Assessment Details Patient Name: Date of Service: [name], [name] 05/05/2020 8:45 A M Medical Record Number: [id number] Patient Account Number: [id number] Date of Birth/Sex: Treating RN: 30-Nov-1960 (59 y.o. [name]) [name] Primary Care Donjuan Robison: [name] Other Clinician: Referring Kayden Amend: Treating Jhoanna Heyde/Extender: [name], [name] in Treatment: 6 Edema Assessment Assessed: [Left: No] [Right: No] Edema: [Left: Yes] [Right: Yes] Calf Left: Right: Point of Measurement: From Medial Instep 22 cm 22 cm Ankle Left: Right: Point of Measurement: From Medial Instep 20 cm 21 cm Electronic Signature(s) Signed: 05/05/2020 2:11:24 PM By: [name] RN Entered By: [name] on 05/05/2020 09:18:37 -------------------------------------------------------------------------------- Multi Wound Chart Details Patient Name: Date of Service: [name] 05/05/2020 8:45 A M Medical Record Number: [id number] Patient Account Number: [id number] Date of Birth/Sex: Treating RN: 01-Sep-1960 (59 y.o. [name] Primary Care Netha Dafoe: [name] Other Clinician: Referring Seldon Barrell: Treating Shneur Whitten[place]/Extender: [name], [name] in Treatment: 6 Vital Signs Height(in): Pulse(bpm): 77 Weight(lbs): Blood Pressure(mmHg): 104/68 Body Mass  Index(BMI): Temperature(F): 97.8 Respiratory Rate(breaths/min): 18 Photos: [40:No Photos Left, Medial Lower Leg] [41:No Photos Right T Fourth oe] [42:No Photos Right, Medial Malleolus] Wound Location: [40:Gradually Appeared] [41:Trauma] [42:Gradually Appeared] Wounding Event: [40:Venous Leg Ulcer] [41:Lymphedema] [42:Venous Leg Ulcer] Primary Etiology: [40:N/A] [41:N/A] [42:N/A] Secondary Etiology: [40:Anemia, Deep Vein Thrombosis,] [41:Anemia, Deep Vein Thrombosis,] [42:Anemia, Deep Vein Thrombosis,] Comorbid History: [40:Colitis, Osteomyelitis, Neuropathy, Quadriplegia, Confinement Anxiety 02/21/2020] [41:Colitis, Osteomyelitis, Neuropathy, Quadriplegia, Confinement Anxiety 02/21/2020] [42:Colitis, Osteomyelitis, Neuropathy, Quadriplegia, Confinement  Anxiety 02/21/2020] Date Acquired: [40:6] [41:6] [42:6] Weeks of Treatment: [40:Open] [41:Open] [42:Open] Wound Status: [40:No] [41:No] [42:Yes] Clustered Wound: [40:N/A] [  needed, State content correctly Wound/Skin Impairment: Methods: Explain/Verbal Responses: Reinforcements needed, State content correctly Electronic Signature(s) Signed: 05/05/2020 5:15:15 PM By: Zenaida DeedBoehlein, Linda RN, BSN Entered By: Zenaida DeedBoehlein, Linda on 05/05/2020 09:33:44 -------------------------------------------------------------------------------- Wound Assessment Details Patient Name: Date of Service: [name], [name] 05/05/2020 8:45 A M Medical Record Number: [id number] Patient Account Number: [id number] Date of Birth/Sex: Treating RN: 11/09/1960 (59 y.o. Judie PetitM) Yevonne PaxEpps, Carrie Primary Care Khailee Mick: Jamison OkaBakare, Mobolaji Other Clinician: Referring Juwuan Sedita: Treating Valari Taylor/Extender: Alethia Bertholdobson, Michael Bakare, [name] in Treatment: 6 Wound Status Wound Number: 40 Primary Venous Leg Ulcer Etiology: Wound Location: Left, Medial Lower Leg Wound Open Wounding Event: Gradually Appeared Status: Date Acquired: 02/21/2020 Comorbid Anemia, Deep Vein Thrombosis, Colitis, Osteomyelitis, Weeks Of Treatment: 6 History: Neuropathy, Quadriplegia, Confinement Anxiety Clustered Wound: No Wound Measurements Length: (cm) 3.5 Width: (cm) 4 Depth: (cm) 0.2 Area: (cm) 10.996 Volume: (cm) 2.199 % Reduction in Area: 49% % Reduction in Volume: -2% Epithelialization: Small  (1-33%) Tunneling: No Undermining: No Wound Description Classification: Full Thickness Without Exposed Support Structures Wound Margin: Flat and Intact Exudate Amount: Large Exudate Type: Serosanguineous Exudate Color: red, brown Foul Odor After Cleansing: No Slough/Fibrino Yes Wound Bed Granulation Amount: Medium (34-66%) Exposed Structure Granulation Quality: Red, Friable Fascia Exposed: No Necrotic Amount: Medium (34-66%) Fat Layer (Subcutaneous Tissue) Exposed: Yes Necrotic Quality: Adherent Slough Tendon Exposed: No Muscle Exposed: No Joint Exposed: No Bone Exposed: No Treatment Notes Wound #40 (Lower Leg) Wound Laterality: Left, Medial Cleanser Wound Cleanser Discharge Instruction: Cleanse the wound with wound cleanser prior to applying a clean dressing using gauze sponges, not tissue or cotton balls. Peri-Wound Care Sween Lotion (Moisturizing lotion) Discharge Instruction: Apply moisturizing lotion with dressing changes Topical Primary Dressing Hydrofera Blue Classic Foam, 4x4 in Discharge Instruction: Apply to wound bed as instructed Secondary Dressing ABD Pad, 5x9 Discharge Instruction: Apply over primary dressing as directed. Secured With Compression Wrap Kerlix Roll 4.5x3.1 (in/yd) Discharge Instruction: Apply Kerlix and Coban compression , be sure to wrap from base of toes to tibial tuberosity. no too tight Coban Self-Adherent Wrap 4x5 (in/yd) Discharge Instruction: Apply over Kerlix as directed. Compression Stockings Add-Ons Electronic Signature(s) Signed: 05/05/2020 2:11:24 PM By: Yevonne PaxEpps, Carrie RN Entered By: Yevonne PaxEpps, Carrie on 05/05/2020 09:20:30 -------------------------------------------------------------------------------- Wound Assessment Details Patient Name: Date of Service: [name] RhineHUMBLE, [name] 05/05/2020 8:45 A M Medical Record Number: [id number] Patient Account Number: [id number] Date of Birth/Sex: Treating RN: 11/09/1960 (59 y.o. Judie PetitM) Yevonne PaxEpps,  Carrie Primary Care Drayden Lukas: Jamison OkaBakare, Mobolaji Other Clinician: Referring Kyshaun Barnette: Treating Chester Sibert/Extender: Alethia Bertholdobson, Michael Bakare, [name] in Treatment: 6 Wound Status Wound Number: 41 Primary Lymphedema Etiology: Wound Location: Right T Fourth oe Wound Open Wounding Event: Trauma Status: Date Acquired: 02/21/2020 Comorbid Anemia, Deep Vein Thrombosis, Colitis, Osteomyelitis, Weeks Of Treatment: 6 Weeks Of Treatment: 6 History: Neuropathy, Quadriplegia, Confinement Anxiety Clustered Wound: No Wound Measurements Length: (cm) 2.7 Width: (cm) 1.2 Depth: (cm) 0.3 Area: (cm) 2.545 Volume: (cm) 0.763 % Reduction in Area: -80% % Reduction in Volume: -441.1% Epithelialization: None Tunneling: No Undermining: No Wound Description Classification: Full Thickness Without Exposed Support Structures Wound Margin: Flat and Intact Exudate Amount: Medium Exudate Type: Serosanguineous Exudate Color: red, brown Foul Odor After Cleansing: No Slough/Fibrino Yes Wound Bed Granulation Amount: Medium (34-66%) Exposed Structure Granulation Quality: Pink Fascia Exposed: No Necrotic Amount: Medium (34-66%) Fat Layer (Subcutaneous Tissue) Exposed: Yes Necrotic Quality: Adherent Slough Tendon Exposed: No Muscle Exposed: No Joint Exposed: No Bone Exposed: No Treatment Notes Wound #41 (Toe Fourth) Wound Laterality: Right Cleanser Wound Cleanser Discharge Instruction: Cleanse the

## 2020-05-12 ENCOUNTER — Other Ambulatory Visit: Payer: Self-pay | Admitting: Internal Medicine

## 2020-05-12 ENCOUNTER — Encounter (HOSPITAL_BASED_OUTPATIENT_CLINIC_OR_DEPARTMENT_OTHER): Payer: Medicare Other | Admitting: Internal Medicine

## 2020-05-12 ENCOUNTER — Other Ambulatory Visit (HOSPITAL_COMMUNITY): Payer: Self-pay | Admitting: Internal Medicine

## 2020-05-12 ENCOUNTER — Other Ambulatory Visit: Payer: Self-pay

## 2020-05-12 DIAGNOSIS — T8131XA Disruption of external operation (surgical) wound, not elsewhere classified, initial encounter: Secondary | ICD-10-CM | POA: Diagnosis not present

## 2020-05-12 DIAGNOSIS — L97519 Non-pressure chronic ulcer of other part of right foot with unspecified severity: Secondary | ICD-10-CM

## 2020-05-12 NOTE — Progress Notes (Signed)
Goal Status: Unmet Unmet Reason: infection, Ulcer/skin breakdown will have a volume reduction of 50% by week 8 Date Initiated: 04/28/2020 Target Resolution Date: 05/19/2020 Goal Status: Active Interventions: Assess patient/caregiver  ability to perform ulcer/skin care regimen upon admission and as needed Provide education on ulcer and skin care Treatment Activities: Skin care regimen initiated : 03/23/2020 Topical wound management initiated : 03/23/2020 Notes: Electronic Signature(s) Signed: 05/12/2020 5:46:53 PM By: [name] RN, BSN Entered By: [name] on 05/12/2020 17:24:08 -------------------------------------------------------------------------------- Pain Assessment Details Patient Name: Date of Service: [name] 05/12/2020 8:45 A M Medical Record Number: [id number] Patient Account Number: [id number] Date of Birth/Sex: Treating RN: [birth year] (59 y.o. [name] Primary Care [name]: [name] Other Clinician: Referring [name]: Treating [name]/Extender: [name], [name] in Treatment: 7 Active Problems Location of Pain Severity and Description of Pain Patient Has Paino Yes Site Locations Duration of the Pain. Constant / Intermittento Intermittent Rate the pain. Current Pain Level: 10 Worst Pain Level: 10 Least Pain Level: 0 Tolerable Pain Level: 10 Character of Pain Describe the Pain: Aching Pain Management and Medication Current Pain Management: Medication: Yes Cold Application: No Rest: Yes Massage: No Activity: No T.E.N.S.: No Heat Application: No Leg drop or elevation: No Is the Current Pain Management Adequate: Adequate How does your wound impact your activities of daily livingo Sleep: No Bathing: No Appetite: No Relationship With Others: No Bladder Continence: No Emotions: No Bowel Continence: No Work: No Toileting: No Drive: No Dressing: No Hobbies: No Electronic Signature(s) Signed: 05/12/2020 5:28:37 PM By: [name] RN Entered By: [name] on 05/12/2020 08:59:06 -------------------------------------------------------------------------------- Patient/Caregiver Education Details Patient  Name: Date of Service: [name] 12/17/2021andnbsp8:45 A M Medical Record Number: [id number] Patient Account Number: [id number] Date of Birth/Gender: Treating RN: 1960-06-11 (59 y.o. [name] Primary Care Physician: [name] Other Clinician: Referring Physician: Treating Physician/Extender: [name] in Treatment: 7 Education Assessment Education Provided To: Patient Education Topics Provided Wound/Skin Impairment: Methods: Explain/Verbal Responses: State content correctly Electronic Signature(s) Signed: 05/12/2020 5:46:53 PM By: [name] RN, BSN Entered By: [name] on 05/12/2020 17:24:20 -------------------------------------------------------------------------------- Wound Assessment Details Patient Name: Date of Service: [name], [name] 05/12/2020 8:45 A M Medical Record Number: [id number] Patient Account Number: [id number] Date of Birth/Sex: Treating RN: 02/01/1961 (59 y.o. [name], [name] Primary Care [name]: [name] Other Clinician: Referring [name]: Treating [name]/Extender: [name], [name] in Treatment: 7 Wound Status Wound Number: [id number] Primary Venous Leg Ulcer Etiology: Wound Location: Left, Medial Lower Leg Wound Open Wounding Event: Gradually Appeared Status: Date Acquired: 02/21/2020 Comorbid Anemia, Deep Vein Thrombosis, Colitis, Osteomyelitis, [name] Of Treatment: 7 History: Neuropathy, Quadriplegia, Confinement Anxiety Clustered Wound: No Wound Measurements Length: (cm) 6 Width: (cm) 4 Depth: (cm) 0.1 Area: (cm) 18.85 Volume: (cm) 1.885 % Reduction in Area: 12.6% % Reduction in Volume: 12.6% Epithelialization: Small (1-33%) Tunneling: No Undermining: No Wound Description Classification: Full Thickness Without Exposed Support Structures Wound Margin: Flat and Intact Exudate Amount: Large Exudate Type: Serosanguineous Exudate Color:  red, brown Foul Odor After Cleansing: No Slough/Fibrino Yes Wound Bed Granulation Amount: Medium (34-66%) Exposed Structure Granulation Quality: Red, Friable Fascia Exposed: No Necrotic Amount: Medium (34-66%) Fat Layer (Subcutaneous Tissue) Exposed: Yes Necrotic Quality: Adherent Slough Tendon Exposed: No Muscle Exposed: No Joint Exposed: No Bone Exposed: No Treatment Notes Wound #40 (Lower Leg) Wound Laterality: Left, Medial Cleanser Wound Cleanser Discharge Instruction: Cleanse the wound with wound cleanser prior to applying a clean dressing using  gauze sponges, not tissue or cotton balls. Peri-Wound Care Sween Lotion (Moisturizing lotion) Discharge Instruction: Apply moisturizing lotion with dressing changes Topical Primary Dressing Hydrofera Blue Classic Foam, 4x4 in Discharge Instruction: Apply to wound bed as instructed Secondary Dressing ABD Pad, 5x9 Discharge Instruction: Apply over primary dressing as directed. Secured With Compression Wrap Kerlix Roll 4.5x3.1 (in/yd) Discharge Instruction: Apply Kerlix and Coban compression , be sure to wrap from base of toes to tibial tuberosity. no too tight Coban Self-Adherent Wrap 4x5 (in/yd) Discharge Instruction: Apply over Kerlix as directed. Compression Stockings Add-Ons Electronic Signature(s) Signed: 05/12/2020 5:28:37 PM By: [name] RN Entered By: [name] on 05/12/2020 09:04:21 -------------------------------------------------------------------------------- Wound Assessment Details Patient Name: Date of Service: [name], [name] 05/12/2020 8:45 A M Medical Record Number: [id number] Patient Account Number: [id number] Date of Birth/Sex: Treating RN: 16-Apr-1961 (59 y.o. [name], [name] Primary Care Shahzaib Azevedo: [name] Other Clinician: Referring [name]: Treating Kamber Vignola/Extender: [name], [name] in Treatment: 7 Wound Status Wound Number: 41 Primary  Lymphedema Etiology: Wound Location: Right T Fourth oe Wound Open Wounding Event: Trauma Status: Date Acquired: 02/21/2020 Comorbid Anemia, Deep Vein Thrombosis, Colitis, Osteomyelitis, [name] Of Treatment: 7 History: Neuropathy, Quadriplegia, Confinement Anxiety Clustered Wound: No Wound Measurements Length: (cm) 1.4 Width: (cm) 1.5 Depth: (cm) 0.2 Area: (cm) 1.649 Volume: (cm) 0.33 % Reduction in Area: -16.6% % Reduction in Volume: -134% Epithelialization: None Tunneling: No Undermining: No Wound Description Classification: Full Thickness Without Exposed Support Structures Wound Margin: Flat and Intact Exudate Amount: Medium Exudate Type: Serosanguineous Exudate Color: red, brown Foul Odor After Cleansing: No Slough/Fibrino Yes Wound Bed Granulation Amount: Medium (34-66%) Exposed Structure Granulation Quality: Pink Fascia Exposed: No Necrotic Amount: Medium (34-66%) Fat Layer (Subcutaneous Tissue) Exposed: Yes Necrotic Quality: Adherent Slough Tendon Exposed: No Muscle Exposed: No Joint Exposed: No Bone Exposed: No Treatment Notes Wound #41 (Toe Fourth) Wound Laterality: Right Cleanser Wound Cleanser Discharge Instruction: Cleanse the wound with wound cleanser prior to applying a clean dressing using gauze sponges, not tissue or cotton balls. Peri-Wound Care Sween Lotion (Moisturizing lotion) Discharge Instruction: Apply moisturizing lotion with dressing changes Topical Bactroban Discharge Instruction: apply to wound bed under alginate Primary Dressing KerraCel Ag Gelling Fiber Dressing, 4x5 in (silver alginate) Discharge Instruction: Apply silver alginate to wound bed as instructed Secondary Dressing Woven Gauze Sponge, Non-Sterile 4x4 in Discharge Instruction: Apply over primary dressing as directed. ABD Pad, 5x9 Discharge Instruction: Apply over primary dressing as directed. Secured With Compression Wrap Kerlix Roll 4.5x3.1 (in/yd) Discharge  Instruction: Apply Kerlix and Coban compression as directed. Coban Self-Adherent Wrap 4x5 (in/yd) Discharge Instruction: Apply over Kerlix as directed. Compression Stockings Add-Ons Electronic Signature(s) Signed: 05/12/2020 5:28:37 PM By: [name] RN Entered By: [name] on 05/12/2020 09:06:20 -------------------------------------------------------------------------------- Wound Assessment Details Patient Name: Date of Service: [name], [name] 05/12/2020 8:45 A M Medical Record Number: [id number] Patient Account Number: [id number] Date of Birth/Sex: Treating RN: 01-Aug-1960 (59 y.o. [name], [name] Primary Care Merleen Picazo: [name] Other Clinician: Referring Ivalene Platte: Treating Anaka Beazer/Extender: [name], [name] in Treatment: 7 Wound Status Wound Number: 42 Primary Venous Leg Ulcer Etiology: Wound Location: Right, Medial Malleolus Wound Open Wounding Event: Gradually Appeared Status: Date Acquired: 02/21/2020 Comorbid Anemia, Deep Vein Thrombosis, Colitis, Osteomyelitis, [name] Of Treatment: 7 History: Neuropathy, Quadriplegia, Confinement Anxiety Clustered Wound: Yes Wound Measurements Length: (cm) 1.4 Width: (cm) 4 Depth: (cm) 0.1 Clustered Quantity: 2 Area: (cm) 4.398 Volume: (cm) 0.44 % Reduction in Area: 12.5% % Reduction in Volume: 56.2% Epithelialization: Small (  Acquired: [40:7] [41:7] [42:7] [name] of Treatment: [40:Open] [41:Open] [42:Open] Wound Status: [40:No] [41:No] [42:Yes] Clustered Wound: [40:N/A] [41:N/A] [42:2] Clustered Quantity: [40:6x4x0.1] [41:1.4x1.5x0.2] [42:1.4x4x0.1] Measurements L x W x D (cm) [40:18.85] [41:1.649] [42:4.398] A (cm) : rea [40:1.885] [41:0.33] [42:0.44] Volume (cm) : [40:12.60%] [41:-16.60%] [42:12.50%] % Reduction in Area: [40:12.60%] [41:-134.00%] [42:56.20%] % Reduction in Volume: [40:Full Thickness Without Exposed] [41:Full Thickness Without Exposed] [42:Full Thickness Without Exposed] Classification: [40:Support Structures Large] [41:Support Structures Medium] [42:Support Structures Medium] Exudate A mount: [40:Serosanguineous] [41:Serosanguineous] [42:Serosanguineous] Exudate Type: [40:red, brown] [41:red, brown] [42:red, brown] Exudate Color: [40:Flat and Intact] [41:Flat and Intact] [42:Flat and Intact] Wound Margin: [40:Medium (34-66%)] [41:Medium (34-66%)] [42:Large (67-100%)] Granulation Amount: [40:Red, Friable] [41:Pink] [42:Pink,  Friable] Granulation Quality: [40:Medium (34-66%)] [41:Medium (34-66%)] [42:Small (1-33%)] Necrotic Amount: [40:Adherent Slough] [41:Adherent Slough] [42:Adherent Slough] Necrotic Tissue: [40:Fat Layer (Subcutaneous Tissue): Yes Fat Layer (Subcutaneous Tissue): Yes Fat Layer (Subcutaneous Tissue): Yes] Exposed Structures: [40:Fascia: No Tendon: No Muscle: No Joint: No Bone: No Small (1-33%)] [41:Fascia: No Tendon: No Muscle: No Joint: No Bone: No None] [42:Fascia: No Tendon: No Muscle: No Joint: No Bone: No Small (1-33%)] Wound Number: [id number] Photos: No Photos No Photos No Photos Right, Distal, Anterior Lower Leg Right, Proximal, Lateral Foot Right T Second oe Wound Location: Pressure Injury Pressure Injury Gradually Appeared Wounding Event: Pressure Ulcer Pressure Ulcer Pressure Ulcer Primary Etiology: Venous Leg Ulcer N/A N/A Secondary Etiology: Anemia, Deep Vein Thrombosis, Anemia, Deep Vein Thrombosis, Anemia, Deep Vein Thrombosis, Comorbid History: Colitis, Osteomyelitis, Neuropathy, Colitis, Osteomyelitis, Neuropathy, Colitis, Osteomyelitis, Neuropathy, Quadriplegia, Confinement Anxiety Quadriplegia, Confinement Anxiety Quadriplegia, Confinement Anxiety 02/21/2020 02/21/2020 03/31/2020 Date Acquired: 7 7 6  [name] of Treatment: Open Open Open Wound Status: Yes No No Clustered Wound: 2 N/A N/A Clustered Quantity: 2.5x0.7x0.3 6x3.2x0.8 0.4x0.9x0.4 Measurements L x W x D (cm) 1.374 15.08 0.283 A (cm) : rea 0.412 12.064 0.113 Volume (cm) : 0.00% -99.80% 53.20% % Reduction in A rea: -49.80% -432.90% -88.30% % Reduction in Volume: Category/Stage III Category/Stage IV Category/Stage III Classification: Small Large Medium Exudate A mount: Serosanguineous Purulent Serosanguineous Exudate Type: red, brown yellow, brown, green red, brown Exudate Color: Flat and Intact Flat and Intact Flat and Intact Wound Margin: Small (1-33%) Small (1-33%) Small (1-33%) Granulation A  mount: Red N/A Pink Granulation Quality: Large (67-100%) Large (67-100%) Large (67-100%) Necrotic A mount: Eschar, Adherent Slough Eschar, Adherent Slough Eschar, Adherent Slough Necrotic Tissue: Fat Layer (Subcutaneous Tissue): Yes Fat Layer (Subcutaneous Tissue): Yes Fat Layer (Subcutaneous Tissue): Yes Exposed Structures: Fascia: No Tendon: Yes Fascia: No Tendon: No Fascia: No Tendon: No Muscle: No Muscle: No Muscle: No Joint: No Joint: No Joint: No Bone: No Bone: No Bone: No Small (1-33%) None None Epithelialization: Treatment Notes Electronic Signature(s) Signed: 05/12/2020 4:58:57 PM By: 05/14/2020 MD Signed: 05/12/2020 5:46:53 PM By: 05/14/2020 RN, BSN Entered By: [name] on 05/12/2020 10:02:29 -------------------------------------------------------------------------------- Multi-Disciplinary Care Plan Details Patient Name: Date of Service: [name], [name] 05/12/2020 8:45 A M Medical Record Number: 05/14/2020 Patient Account Number: [id number] Date of Birth/Sex: Treating RN: 01-17-1961 (59 y.o. 03/27/1961 Primary Care Murray Durrell: [name] Other Clinician: Referring Oaklee Sunga: Treating Monai Hindes/Extender: [name], [name] in Treatment: 7 Active Inactive Wound/Skin Impairment Nursing Diagnoses: Knowledge deficit related to ulceration/compromised skin integrity Goals: Patient/caregiver will verbalize understanding of skin care regimen Date Initiated: 03/23/2020 Target Resolution Date: 06/22/2020 Goal Status: Active Ulcer/skin breakdown will have a volume reduction of 30% by week 4 Date Initiated: 03/31/2020 Date Inactivated: 04/28/2020 Target Resolution Date: 04/21/2020  Acquired: [40:7] [41:7] [42:7] [name] of Treatment: [40:Open] [41:Open] [42:Open] Wound Status: [40:No] [41:No] [42:Yes] Clustered Wound: [40:N/A] [41:N/A] [42:2] Clustered Quantity: [40:6x4x0.1] [41:1.4x1.5x0.2] [42:1.4x4x0.1] Measurements L x W x D (cm) [40:18.85] [41:1.649] [42:4.398] A (cm) : rea [40:1.885] [41:0.33] [42:0.44] Volume (cm) : [40:12.60%] [41:-16.60%] [42:12.50%] % Reduction in Area: [40:12.60%] [41:-134.00%] [42:56.20%] % Reduction in Volume: [40:Full Thickness Without Exposed] [41:Full Thickness Without Exposed] [42:Full Thickness Without Exposed] Classification: [40:Support Structures Large] [41:Support Structures Medium] [42:Support Structures Medium] Exudate A mount: [40:Serosanguineous] [41:Serosanguineous] [42:Serosanguineous] Exudate Type: [40:red, brown] [41:red, brown] [42:red, brown] Exudate Color: [40:Flat and Intact] [41:Flat and Intact] [42:Flat and Intact] Wound Margin: [40:Medium (34-66%)] [41:Medium (34-66%)] [42:Large (67-100%)] Granulation Amount: [40:Red, Friable] [41:Pink] [42:Pink,  Friable] Granulation Quality: [40:Medium (34-66%)] [41:Medium (34-66%)] [42:Small (1-33%)] Necrotic Amount: [40:Adherent Slough] [41:Adherent Slough] [42:Adherent Slough] Necrotic Tissue: [40:Fat Layer (Subcutaneous Tissue): Yes Fat Layer (Subcutaneous Tissue): Yes Fat Layer (Subcutaneous Tissue): Yes] Exposed Structures: [40:Fascia: No Tendon: No Muscle: No Joint: No Bone: No Small (1-33%)] [41:Fascia: No Tendon: No Muscle: No Joint: No Bone: No None] [42:Fascia: No Tendon: No Muscle: No Joint: No Bone: No Small (1-33%)] Wound Number: [id number] Photos: No Photos No Photos No Photos Right, Distal, Anterior Lower Leg Right, Proximal, Lateral Foot Right T Second oe Wound Location: Pressure Injury Pressure Injury Gradually Appeared Wounding Event: Pressure Ulcer Pressure Ulcer Pressure Ulcer Primary Etiology: Venous Leg Ulcer N/A N/A Secondary Etiology: Anemia, Deep Vein Thrombosis, Anemia, Deep Vein Thrombosis, Anemia, Deep Vein Thrombosis, Comorbid History: Colitis, Osteomyelitis, Neuropathy, Colitis, Osteomyelitis, Neuropathy, Colitis, Osteomyelitis, Neuropathy, Quadriplegia, Confinement Anxiety Quadriplegia, Confinement Anxiety Quadriplegia, Confinement Anxiety 02/21/2020 02/21/2020 03/31/2020 Date Acquired: 7 7 6  [name] of Treatment: Open Open Open Wound Status: Yes No No Clustered Wound: 2 N/A N/A Clustered Quantity: 2.5x0.7x0.3 6x3.2x0.8 0.4x0.9x0.4 Measurements L x W x D (cm) 1.374 15.08 0.283 A (cm) : rea 0.412 12.064 0.113 Volume (cm) : 0.00% -99.80% 53.20% % Reduction in A rea: -49.80% -432.90% -88.30% % Reduction in Volume: Category/Stage III Category/Stage IV Category/Stage III Classification: Small Large Medium Exudate A mount: Serosanguineous Purulent Serosanguineous Exudate Type: red, brown yellow, brown, green red, brown Exudate Color: Flat and Intact Flat and Intact Flat and Intact Wound Margin: Small (1-33%) Small (1-33%) Small (1-33%) Granulation A  mount: Red N/A Pink Granulation Quality: Large (67-100%) Large (67-100%) Large (67-100%) Necrotic A mount: Eschar, Adherent Slough Eschar, Adherent Slough Eschar, Adherent Slough Necrotic Tissue: Fat Layer (Subcutaneous Tissue): Yes Fat Layer (Subcutaneous Tissue): Yes Fat Layer (Subcutaneous Tissue): Yes Exposed Structures: Fascia: No Tendon: Yes Fascia: No Tendon: No Fascia: No Tendon: No Muscle: No Muscle: No Muscle: No Joint: No Joint: No Joint: No Bone: No Bone: No Bone: No Small (1-33%) None None Epithelialization: Treatment Notes Electronic Signature(s) Signed: 05/12/2020 4:58:57 PM By: 05/14/2020 MD Signed: 05/12/2020 5:46:53 PM By: 05/14/2020 RN, BSN Entered By: [name] on 05/12/2020 10:02:29 -------------------------------------------------------------------------------- Multi-Disciplinary Care Plan Details Patient Name: Date of Service: [name], [name] 05/12/2020 8:45 A M Medical Record Number: 05/14/2020 Patient Account Number: [id number] Date of Birth/Sex: Treating RN: 01-17-1961 (59 y.o. 03/27/1961 Primary Care Murray Durrell: [name] Other Clinician: Referring Oaklee Sunga: Treating Monai Hindes/Extender: [name], [name] in Treatment: 7 Active Inactive Wound/Skin Impairment Nursing Diagnoses: Knowledge deficit related to ulceration/compromised skin integrity Goals: Patient/caregiver will verbalize understanding of skin care regimen Date Initiated: 03/23/2020 Target Resolution Date: 06/22/2020 Goal Status: Active Ulcer/skin breakdown will have a volume reduction of 30% by week 4 Date Initiated: 03/31/2020 Date Inactivated: 04/28/2020 Target Resolution Date: 04/21/2020  gauze sponges, not tissue or cotton balls. Peri-Wound Care Sween Lotion (Moisturizing lotion) Discharge Instruction: Apply moisturizing lotion with dressing changes Topical Primary Dressing Hydrofera Blue Classic Foam, 4x4 in Discharge Instruction: Apply to wound bed as instructed Secondary Dressing ABD Pad, 5x9 Discharge Instruction: Apply over primary dressing as directed. Secured With Compression Wrap Kerlix Roll 4.5x3.1 (in/yd) Discharge Instruction: Apply Kerlix and Coban compression , be sure to wrap from base of toes to tibial tuberosity. no too tight Coban Self-Adherent Wrap 4x5 (in/yd) Discharge Instruction: Apply over Kerlix as directed. Compression Stockings Add-Ons Electronic Signature(s) Signed: 05/12/2020 5:28:37 PM By: [name] RN Entered By: [name] on 05/12/2020 09:04:21 -------------------------------------------------------------------------------- Wound Assessment Details Patient Name: Date of Service: [name], [name] 05/12/2020 8:45 A M Medical Record Number: [id number] Patient Account Number: [id number] Date of Birth/Sex: Treating RN: 16-Apr-1961 (59 y.o. [name], [name] Primary Care Shahzaib Azevedo: [name] Other Clinician: Referring [name]: Treating Kamber Vignola/Extender: [name], [name] in Treatment: 7 Wound Status Wound Number: 41 Primary  Lymphedema Etiology: Wound Location: Right T Fourth oe Wound Open Wounding Event: Trauma Status: Date Acquired: 02/21/2020 Comorbid Anemia, Deep Vein Thrombosis, Colitis, Osteomyelitis, [name] Of Treatment: 7 History: Neuropathy, Quadriplegia, Confinement Anxiety Clustered Wound: No Wound Measurements Length: (cm) 1.4 Width: (cm) 1.5 Depth: (cm) 0.2 Area: (cm) 1.649 Volume: (cm) 0.33 % Reduction in Area: -16.6% % Reduction in Volume: -134% Epithelialization: None Tunneling: No Undermining: No Wound Description Classification: Full Thickness Without Exposed Support Structures Wound Margin: Flat and Intact Exudate Amount: Medium Exudate Type: Serosanguineous Exudate Color: red, brown Foul Odor After Cleansing: No Slough/Fibrino Yes Wound Bed Granulation Amount: Medium (34-66%) Exposed Structure Granulation Quality: Pink Fascia Exposed: No Necrotic Amount: Medium (34-66%) Fat Layer (Subcutaneous Tissue) Exposed: Yes Necrotic Quality: Adherent Slough Tendon Exposed: No Muscle Exposed: No Joint Exposed: No Bone Exposed: No Treatment Notes Wound #41 (Toe Fourth) Wound Laterality: Right Cleanser Wound Cleanser Discharge Instruction: Cleanse the wound with wound cleanser prior to applying a clean dressing using gauze sponges, not tissue or cotton balls. Peri-Wound Care Sween Lotion (Moisturizing lotion) Discharge Instruction: Apply moisturizing lotion with dressing changes Topical Bactroban Discharge Instruction: apply to wound bed under alginate Primary Dressing KerraCel Ag Gelling Fiber Dressing, 4x5 in (silver alginate) Discharge Instruction: Apply silver alginate to wound bed as instructed Secondary Dressing Woven Gauze Sponge, Non-Sterile 4x4 in Discharge Instruction: Apply over primary dressing as directed. ABD Pad, 5x9 Discharge Instruction: Apply over primary dressing as directed. Secured With Compression Wrap Kerlix Roll 4.5x3.1 (in/yd) Discharge  Instruction: Apply Kerlix and Coban compression as directed. Coban Self-Adherent Wrap 4x5 (in/yd) Discharge Instruction: Apply over Kerlix as directed. Compression Stockings Add-Ons Electronic Signature(s) Signed: 05/12/2020 5:28:37 PM By: [name] RN Entered By: [name] on 05/12/2020 09:06:20 -------------------------------------------------------------------------------- Wound Assessment Details Patient Name: Date of Service: [name], [name] 05/12/2020 8:45 A M Medical Record Number: [id number] Patient Account Number: [id number] Date of Birth/Sex: Treating RN: 01-Aug-1960 (59 y.o. [name], [name] Primary Care Merleen Picazo: [name] Other Clinician: Referring Ivalene Platte: Treating Anaka Beazer/Extender: [name], [name] in Treatment: 7 Wound Status Wound Number: 42 Primary Venous Leg Ulcer Etiology: Wound Location: Right, Medial Malleolus Wound Open Wounding Event: Gradually Appeared Status: Date Acquired: 02/21/2020 Comorbid Anemia, Deep Vein Thrombosis, Colitis, Osteomyelitis, [name] Of Treatment: 7 History: Neuropathy, Quadriplegia, Confinement Anxiety Clustered Wound: Yes Wound Measurements Length: (cm) 1.4 Width: (cm) 4 Depth: (cm) 0.1 Clustered Quantity: 2 Area: (cm) 4.398 Volume: (cm) 0.44 % Reduction in Area: 12.5% % Reduction in Volume: 56.2% Epithelialization: Small (  Acquired: [40:7] [41:7] [42:7] [name] of Treatment: [40:Open] [41:Open] [42:Open] Wound Status: [40:No] [41:No] [42:Yes] Clustered Wound: [40:N/A] [41:N/A] [42:2] Clustered Quantity: [40:6x4x0.1] [41:1.4x1.5x0.2] [42:1.4x4x0.1] Measurements L x W x D (cm) [40:18.85] [41:1.649] [42:4.398] A (cm) : rea [40:1.885] [41:0.33] [42:0.44] Volume (cm) : [40:12.60%] [41:-16.60%] [42:12.50%] % Reduction in Area: [40:12.60%] [41:-134.00%] [42:56.20%] % Reduction in Volume: [40:Full Thickness Without Exposed] [41:Full Thickness Without Exposed] [42:Full Thickness Without Exposed] Classification: [40:Support Structures Large] [41:Support Structures Medium] [42:Support Structures Medium] Exudate A mount: [40:Serosanguineous] [41:Serosanguineous] [42:Serosanguineous] Exudate Type: [40:red, brown] [41:red, brown] [42:red, brown] Exudate Color: [40:Flat and Intact] [41:Flat and Intact] [42:Flat and Intact] Wound Margin: [40:Medium (34-66%)] [41:Medium (34-66%)] [42:Large (67-100%)] Granulation Amount: [40:Red, Friable] [41:Pink] [42:Pink,  Friable] Granulation Quality: [40:Medium (34-66%)] [41:Medium (34-66%)] [42:Small (1-33%)] Necrotic Amount: [40:Adherent Slough] [41:Adherent Slough] [42:Adherent Slough] Necrotic Tissue: [40:Fat Layer (Subcutaneous Tissue): Yes Fat Layer (Subcutaneous Tissue): Yes Fat Layer (Subcutaneous Tissue): Yes] Exposed Structures: [40:Fascia: No Tendon: No Muscle: No Joint: No Bone: No Small (1-33%)] [41:Fascia: No Tendon: No Muscle: No Joint: No Bone: No None] [42:Fascia: No Tendon: No Muscle: No Joint: No Bone: No Small (1-33%)] Wound Number: [id number] Photos: No Photos No Photos No Photos Right, Distal, Anterior Lower Leg Right, Proximal, Lateral Foot Right T Second oe Wound Location: Pressure Injury Pressure Injury Gradually Appeared Wounding Event: Pressure Ulcer Pressure Ulcer Pressure Ulcer Primary Etiology: Venous Leg Ulcer N/A N/A Secondary Etiology: Anemia, Deep Vein Thrombosis, Anemia, Deep Vein Thrombosis, Anemia, Deep Vein Thrombosis, Comorbid History: Colitis, Osteomyelitis, Neuropathy, Colitis, Osteomyelitis, Neuropathy, Colitis, Osteomyelitis, Neuropathy, Quadriplegia, Confinement Anxiety Quadriplegia, Confinement Anxiety Quadriplegia, Confinement Anxiety 02/21/2020 02/21/2020 03/31/2020 Date Acquired: 7 7 6  [name] of Treatment: Open Open Open Wound Status: Yes No No Clustered Wound: 2 N/A N/A Clustered Quantity: 2.5x0.7x0.3 6x3.2x0.8 0.4x0.9x0.4 Measurements L x W x D (cm) 1.374 15.08 0.283 A (cm) : rea 0.412 12.064 0.113 Volume (cm) : 0.00% -99.80% 53.20% % Reduction in A rea: -49.80% -432.90% -88.30% % Reduction in Volume: Category/Stage III Category/Stage IV Category/Stage III Classification: Small Large Medium Exudate A mount: Serosanguineous Purulent Serosanguineous Exudate Type: red, brown yellow, brown, green red, brown Exudate Color: Flat and Intact Flat and Intact Flat and Intact Wound Margin: Small (1-33%) Small (1-33%) Small (1-33%) Granulation A  mount: Red N/A Pink Granulation Quality: Large (67-100%) Large (67-100%) Large (67-100%) Necrotic A mount: Eschar, Adherent Slough Eschar, Adherent Slough Eschar, Adherent Slough Necrotic Tissue: Fat Layer (Subcutaneous Tissue): Yes Fat Layer (Subcutaneous Tissue): Yes Fat Layer (Subcutaneous Tissue): Yes Exposed Structures: Fascia: No Tendon: Yes Fascia: No Tendon: No Fascia: No Tendon: No Muscle: No Muscle: No Muscle: No Joint: No Joint: No Joint: No Bone: No Bone: No Bone: No Small (1-33%) None None Epithelialization: Treatment Notes Electronic Signature(s) Signed: 05/12/2020 4:58:57 PM By: 05/14/2020 MD Signed: 05/12/2020 5:46:53 PM By: 05/14/2020 RN, BSN Entered By: [name] on 05/12/2020 10:02:29 -------------------------------------------------------------------------------- Multi-Disciplinary Care Plan Details Patient Name: Date of Service: [name], [name] 05/12/2020 8:45 A M Medical Record Number: 05/14/2020 Patient Account Number: [id number] Date of Birth/Sex: Treating RN: 01-17-1961 (59 y.o. 03/27/1961 Primary Care Murray Durrell: [name] Other Clinician: Referring Oaklee Sunga: Treating Monai Hindes/Extender: [name], [name] in Treatment: 7 Active Inactive Wound/Skin Impairment Nursing Diagnoses: Knowledge deficit related to ulceration/compromised skin integrity Goals: Patient/caregiver will verbalize understanding of skin care regimen Date Initiated: 03/23/2020 Target Resolution Date: 06/22/2020 Goal Status: Active Ulcer/skin breakdown will have a volume reduction of 30% by week 4 Date Initiated: 03/31/2020 Date Inactivated: 04/28/2020 Target Resolution Date: 04/21/2020  09:11:16 -------------------------------------------------------------------------------- Wound Assessment Details Patient Name: Date of Service: [name], [name] 05/12/2020 8:45 A M Medical Record Number: [id number] Patient Account Number: [id number] Date of Birth/Sex: Treating RN: 1960/08/27 (59 y.o. [name], [name] Primary Care Yarnell Arvidson: [name] Other Clinician: Referring Kaeson Kleinert: Treating Graylyn Bunney/Extender: [name], [name] in Treatment: 7 Wound Status Wound Number: [id number] Primary Pressure Ulcer Etiology: Wound Location: Right, Proximal, Lateral Foot Wound Open Wounding Event: Pressure Injury Status: Date Acquired: 02/21/2020 Comorbid Anemia, Deep Vein Thrombosis, Colitis, Osteomyelitis, [name] Of Treatment: 7 History: Neuropathy, Quadriplegia, Confinement Anxiety Clustered Wound: No Wound Measurements Length: (cm) 6 Width: (cm) 3.2 Depth: (cm) 0.8 Area: (cm) 15.08 Volume: (cm) 12.064 % Reduction in Area: -99.8% % Reduction in Volume: -432.9% Epithelialization: None Wound Description Classification: Category/Stage IV Wound Margin: Flat and Intact Exudate Amount: Large Exudate Type: Purulent Exudate Color: yellow, brown, green Foul Odor After Cleansing: No Slough/Fibrino Yes Wound Bed Granulation Amount: Small (1-33%) Exposed Structure Necrotic Amount: Large (67-100%) Fascia Exposed: No Necrotic Quality: Eschar, Adherent Slough Fat Layer (Subcutaneous Tissue) Exposed: Yes Tendon Exposed: Yes Muscle Exposed: No Joint Exposed: No Bone Exposed: No Treatment Notes Wound #44 (Foot) Wound Laterality: Right, Lateral, Proximal Cleanser Wound Cleanser Discharge Instruction: Cleanse the wound with wound cleanser  prior to applying a clean dressing using gauze sponges, not tissue or cotton balls. Peri-Wound Care Sween Lotion (Moisturizing lotion) Discharge Instruction: Apply moisturizing lotion with dressing changes Topical Bactroban Discharge Instruction: apply to wound bed under alginate Primary Dressing KerraCel Ag Gelling Fiber Dressing, 4x5 in (silver alginate) Discharge Instruction: Apply silver alginate to wound bed as instructed Secondary Dressing Woven Gauze Sponge, Non-Sterile 4x4 in Discharge Instruction: Apply over primary dressing as directed. ABD Pad, 5x9 Discharge Instruction: Apply over primary dressing as directed. Secured With Compression Wrap Kerlix Roll 4.5x3.1 (in/yd) Discharge Instruction: Apply Kerlix and Coban compression as directed. Coban Self-Adherent Wrap 4x5 (in/yd) Discharge Instruction: Apply over Kerlix as directed. Compression Stockings Add-Ons Electronic Signature(s) Signed: 05/12/2020 5:28:37 PM By: [name] RN Entered By: [name] on 05/12/2020 09:12:45 -------------------------------------------------------------------------------- Wound Assessment Details Patient Name: Date of Service: [name], [name] 05/12/2020 8:45 A M Medical Record Number: [id number] Patient Account Number: [id number] Date of Birth/Sex: Treating RN: 27-Jul-1960 (59 y.o. [name], [name] Primary Care Glynn Freas: [name] Other Clinician: Referring Dao Mearns: Treating Shanya Ferriss/Extender: [name], [name] in Treatment: 7 Wound Status Wound Number: [id number] Primary Pressure Ulcer Etiology: Wound Location: Right T Second oe Wound Open Wounding Event: Gradually Appeared Status: Date Acquired: 03/31/2020 Comorbid Anemia, Deep Vein Thrombosis, Colitis, Osteomyelitis, [name] Of Treatment: 6 History: Neuropathy, Quadriplegia, Confinement Anxiety Clustered Wound: No Wound Measurements Length: (cm) 0.4 Width: (cm) 0.9 Depth: (cm)  0.4 Area: (cm) 0.283 Volume: (cm) 0.113 % Reduction in Area: 53.2% % Reduction in Volume: -88.3% Epithelialization: None Tunneling: No Undermining: No Wound Description Classification: Category/Stage III Wound Margin: Flat and Intact Exudate Amount: Medium Exudate Type: Serosanguineous Exudate Color: red, brown Foul Odor After Cleansing: No Slough/Fibrino Yes Wound Bed Granulation Amount: Small (1-33%) Exposed Structure Granulation Quality: Pink Fascia Exposed: No Necrotic Amount: Large (67-100%) Fat Layer (Subcutaneous Tissue) Exposed: Yes Necrotic Quality: Eschar, Adherent Slough Tendon Exposed: No Muscle Exposed: No Joint Exposed: No Bone Exposed: No Treatment Notes Wound #46 (Toe Second) Wound Laterality: Right Cleanser Wound Cleanser Discharge Instruction: Cleanse the wound with wound cleanser prior to applying a clean dressing using gauze sponges, not tissue or cotton balls. Peri-Wound Care Sween Lotion (Moisturizing lotion) Discharge Instruction: Apply moisturizing lotion with dressing changes Topical  Acquired: [40:7] [41:7] [42:7] [name] of Treatment: [40:Open] [41:Open] [42:Open] Wound Status: [40:No] [41:No] [42:Yes] Clustered Wound: [40:N/A] [41:N/A] [42:2] Clustered Quantity: [40:6x4x0.1] [41:1.4x1.5x0.2] [42:1.4x4x0.1] Measurements L x W x D (cm) [40:18.85] [41:1.649] [42:4.398] A (cm) : rea [40:1.885] [41:0.33] [42:0.44] Volume (cm) : [40:12.60%] [41:-16.60%] [42:12.50%] % Reduction in Area: [40:12.60%] [41:-134.00%] [42:56.20%] % Reduction in Volume: [40:Full Thickness Without Exposed] [41:Full Thickness Without Exposed] [42:Full Thickness Without Exposed] Classification: [40:Support Structures Large] [41:Support Structures Medium] [42:Support Structures Medium] Exudate A mount: [40:Serosanguineous] [41:Serosanguineous] [42:Serosanguineous] Exudate Type: [40:red, brown] [41:red, brown] [42:red, brown] Exudate Color: [40:Flat and Intact] [41:Flat and Intact] [42:Flat and Intact] Wound Margin: [40:Medium (34-66%)] [41:Medium (34-66%)] [42:Large (67-100%)] Granulation Amount: [40:Red, Friable] [41:Pink] [42:Pink,  Friable] Granulation Quality: [40:Medium (34-66%)] [41:Medium (34-66%)] [42:Small (1-33%)] Necrotic Amount: [40:Adherent Slough] [41:Adherent Slough] [42:Adherent Slough] Necrotic Tissue: [40:Fat Layer (Subcutaneous Tissue): Yes Fat Layer (Subcutaneous Tissue): Yes Fat Layer (Subcutaneous Tissue): Yes] Exposed Structures: [40:Fascia: No Tendon: No Muscle: No Joint: No Bone: No Small (1-33%)] [41:Fascia: No Tendon: No Muscle: No Joint: No Bone: No None] [42:Fascia: No Tendon: No Muscle: No Joint: No Bone: No Small (1-33%)] Wound Number: [id number] Photos: No Photos No Photos No Photos Right, Distal, Anterior Lower Leg Right, Proximal, Lateral Foot Right T Second oe Wound Location: Pressure Injury Pressure Injury Gradually Appeared Wounding Event: Pressure Ulcer Pressure Ulcer Pressure Ulcer Primary Etiology: Venous Leg Ulcer N/A N/A Secondary Etiology: Anemia, Deep Vein Thrombosis, Anemia, Deep Vein Thrombosis, Anemia, Deep Vein Thrombosis, Comorbid History: Colitis, Osteomyelitis, Neuropathy, Colitis, Osteomyelitis, Neuropathy, Colitis, Osteomyelitis, Neuropathy, Quadriplegia, Confinement Anxiety Quadriplegia, Confinement Anxiety Quadriplegia, Confinement Anxiety 02/21/2020 02/21/2020 03/31/2020 Date Acquired: 7 7 6  [name] of Treatment: Open Open Open Wound Status: Yes No No Clustered Wound: 2 N/A N/A Clustered Quantity: 2.5x0.7x0.3 6x3.2x0.8 0.4x0.9x0.4 Measurements L x W x D (cm) 1.374 15.08 0.283 A (cm) : rea 0.412 12.064 0.113 Volume (cm) : 0.00% -99.80% 53.20% % Reduction in A rea: -49.80% -432.90% -88.30% % Reduction in Volume: Category/Stage III Category/Stage IV Category/Stage III Classification: Small Large Medium Exudate A mount: Serosanguineous Purulent Serosanguineous Exudate Type: red, brown yellow, brown, green red, brown Exudate Color: Flat and Intact Flat and Intact Flat and Intact Wound Margin: Small (1-33%) Small (1-33%) Small (1-33%) Granulation A  mount: Red N/A Pink Granulation Quality: Large (67-100%) Large (67-100%) Large (67-100%) Necrotic A mount: Eschar, Adherent Slough Eschar, Adherent Slough Eschar, Adherent Slough Necrotic Tissue: Fat Layer (Subcutaneous Tissue): Yes Fat Layer (Subcutaneous Tissue): Yes Fat Layer (Subcutaneous Tissue): Yes Exposed Structures: Fascia: No Tendon: Yes Fascia: No Tendon: No Fascia: No Tendon: No Muscle: No Muscle: No Muscle: No Joint: No Joint: No Joint: No Bone: No Bone: No Bone: No Small (1-33%) None None Epithelialization: Treatment Notes Electronic Signature(s) Signed: 05/12/2020 4:58:57 PM By: 05/14/2020 MD Signed: 05/12/2020 5:46:53 PM By: 05/14/2020 RN, BSN Entered By: [name] on 05/12/2020 10:02:29 -------------------------------------------------------------------------------- Multi-Disciplinary Care Plan Details Patient Name: Date of Service: [name], [name] 05/12/2020 8:45 A M Medical Record Number: 05/14/2020 Patient Account Number: [id number] Date of Birth/Sex: Treating RN: 01-17-1961 (59 y.o. 03/27/1961 Primary Care Murray Durrell: [name] Other Clinician: Referring Oaklee Sunga: Treating Monai Hindes/Extender: [name], [name] in Treatment: 7 Active Inactive Wound/Skin Impairment Nursing Diagnoses: Knowledge deficit related to ulceration/compromised skin integrity Goals: Patient/caregiver will verbalize understanding of skin care regimen Date Initiated: 03/23/2020 Target Resolution Date: 06/22/2020 Goal Status: Active Ulcer/skin breakdown will have a volume reduction of 30% by week 4 Date Initiated: 03/31/2020 Date Inactivated: 04/28/2020 Target Resolution Date: 04/21/2020

## 2020-05-15 NOTE — Progress Notes (Signed)
[name], [name] ([id number]) Visit Report for 05/12/2020 HPI Details Patient Name: Date of Service: [name], [name] 05/12/2020 8:45 A M Medical Record Number: [id number] Patient Account Number: [id number] Date of Birth/Sex: Treating RN: [id number] (59 y.o. [name] Primary Care Provider: [name] Other Clinician: Referring Provider: Treating Provider/Extender: [name], [name] in Treatment: 7 History of Present Illness HPI Description: 11/23/15; this is a patient I remember from a previous stay in this clinic in 2012. The patient says this was for a wound in this same area as currently although I have no information to verify that. Most of the information is provided by his caregiver from the group home where he resides. Apparently he has had a wound in this area for 8 months. He also has erythema around this area and has had multiple rounds of antibiotics apparently with some improvement but the erythema is persists. He apparently had a fracture in the ankle 2 months ago and was seen by Dr. Lajoyce Corners . He apparently had a splint applied and more recently a heel offloading boot. He had home health coming out to a week ago not clear what they are dressing this with but they apparently discharged him perhaps because of one of Dr. 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 He has a Nurse, adult for transferring at home he is nonambulatory and does not wear footwear. He is not a diabetic. Looking through [facility] he appears to have fairly active primary care. The area on the ankle is variably labeled as a pressure area and/or chronic venous insufficiency. He had a recent venous ultrasound on 5/25 that did not show a DVT in the left leg however this was not a reflux study. There was no superficial DVT either. Arterial studies on 10/15/14 showed a left ABI of 1.16 Center right of 1.12 waveforms were triphasic he does not have  significant arterial disease. He has had recent x-rays of the left foot and ankle. The left foot did not show any abnormality. Left ankle x-ray showed a spiral fracture of the left tibial diaphysis without evidence of osteomyelitis. 11/30/15 culture I did last week showed pseudomonas I changed him to oral ciprofloxacin. Erythema is a lot better. 12/07/15 area around the wound shows considerable improvement of the periwound erythema. 12/14/15; the patient has a improvement of the periwound erythema which in retrospect I think with cellulitis successfully treated. We have been using Iodoflex started last week 12/25/15 wound on the left medial malleolus and dorsal left foot. Theraskin #1 applied READMISSION 02/27/16; the patient was admitted to hospital from 8/14 through 01/18/16 initially felt to have cellulitis of his left lower leg however he was noted to have significant persistent pain and swelling. A duplex ultrasound was ordered that was negative. Obie Dredge an x-ray was done that showed a spiral fracture of the left leg. With posterior displacement and angulation at the mid left tibia. Orthopedics saw the patient and splinted the leg. He was then sent to [facility] skilled facility and only return to his group home last week. He has wounds on the left medial malleolus, left dorsal foot and a worrisome area on the plantar left heel which is not really open but may represent a hematoma or a DTI 03/06/16; areas of left anterior and medial ankle both look improved. There are 3 small wounds here. He has a area over the dorsal left first metatarsal head which is not open. In meantime his left  heel has opened 03/14/16 the medial left ankle wounds looks stable to improved. The area on the posterior left heel has opened up and had a very adherent eschar and slept over this 03/21/16 patient has 3 wounds on his left medial ankle is stable area on his left dorsal first metatarsal head appears to be improved.  Continued problems with adherent eschar over the left heel 03/28/16; patient has 3 wounds on his left medial ankle and lower leg all of these appear to be stable. The problem continues to be the left heel and the adherent eschar. This may have been a pressure ulcer from her brace at one point. He has a English as a second language teacher we have been using [employer] and all wound areas. Dressings are being changed by home health 04/04/16; the patient's areas on the medial aspect of the left ankle looks superficial and appear to be gradually closing. The area on the plantar aspects/Achilles aspect of his left heel as surface slough and nonviable subcutaneous tissue requiring debridement. 04/11/16; the areas on the patient's medial left ankle appear to be superficial and appear to be closing. The area on the plantar heel on the left has surface slough and nonviable subcutaneous tissue requiring debridement. I don't think this is too much different from last week there is also undermining 04/25/16; patient is followed in his group home by [facility] home health. The area on the plantar heel is smaller but with still with some depth. We're using Iodoflex to this. The areas on the left ankle looks healthy but dry and somewhat larger. Cause of this is not really clear using Prisma 05/02/16; areas x3 all look improved expecially the areas on the medial ankle. Heel wound is smaller but still deep 05/09/16; all areas 3 look improved on the medial ankle. Heel wound is also contracting. 12/0.9/17 the areas on his left medial ankle are all closed. I think these are largely chronic venous insufficiency however there is no edema. The area on his left heel was a pressure area. READMISSION 08/02/16; this man is a patient that we have had in this clinic on several times before. I believe he has cerebral palsy is nonambulatory. He has chronic venous insufficiency with venous inflammation. He was last year in the fall of 2017 with areas on his  left medial ankle left heel and left dorsal foot. He arrives today with once again a vague history. As mentioned he is nonambulatory he spends most of his day in a pressure offloading boot nevertheless they noted wounds in this area on the medial left ankle 2 weeks ago. There is any other history here I am uncertain 08/15/16; the patient's wounds on his medial ankle actually look better however the area on the left lateral/dorsal foot is covered in black necrotic eschar. Previous formal arterial studies did not show significant arterial disease with normal ABIs and triphasic waveforms. He is immobilized and at risk for pressure although he seems aware protective boots at all times as far as I am aware. 08/22/16; in general his wounds look better. All the wounds on his medial left ankle look improved the area on his lateral foot however continues to be problematic. Patient comes in the clinic today in a new wheelchair he is quite delighted 09/05/16; a wound on the left medial Calf and left dorsal foot are healed. The area on the left lateral foot is improved however the area on the left medial malleolus is inflamed with a considerable degree of surrounding erythema. This  looks like cellulitis. ABIs and clinical exam are within normal limits 09/12/16; the patient continues to have a wound on the left medial calf. I was concerned about cellulitis in this area last week and gave him a course of doxycycline. The wound looks better here. The area on the left lateral foot however is unchanged 09/20/16; the patient came in this week early after traumatizing his toe on the left foot on a table while moving around in his motorized wheelchair. 09/26/16- he is here for follow-up evaluation of his left foot and malleolus ulcers. Since his appointment he abraded his left medial hallux against a table while in his motorized wheelchair, apparently trying to pull up to the table for a meal. He continues to complain of  tenderness to the left lateral foot, surrounding the current ulcer 10/07/16; left foot and malleolus ulcers. 10/14/16; the patient is doing well. He has a wound on the left lateral, left medial malleolus and atraumatic wound on the left great toe. Although these appear to be superficial and closing 10/31/16; the patient is doing well. The area on the left lateral foot, left medial malleolus and the left great toe or all epithelialized. He has home health 11/14/16; patient arrives today with the area on the left great toe healed. The left lateral foot however in the left medial malleolus and the ankle superficial wounds that are slightly larger. He has erythema and tenderness around the area on the left lateral foot. As well he is complaining of dysuria. His caregiver notes that when he gets like this he is more lethargic which indeed seems to be the case today 11/21/16; left great toe is still healed. His wound on the left lateral foot appears healthy and slightly smaller also the area on the left medial ankle. This had black eschar on it which I removed. The remaining wound looks satisfactory 12/12/16; patient hasn't been here in a number of weeks. The area on the left lateral foot is healed however on his medial ankle has 2 open areas one of them covered with a necrotic eschar. He also has a new wound on the right second toe which was apparently trauma induced while he was at a movie 12/27/16; the patient has wounds on his left medial mallelus and left lateral foot that are just about closed. The area on the right second toe which was trauma from last time is still open but again everything looks a lot better here. 01/30/17; patient arrives in clinic today and is really been a long time since I've seen him. The wounds are all epithelialized on the left foot this includes his left lateral foot, left medial malleolus. Apparently was in the ER for what of I think was a fungal infection on his buttock's he was  prescribed Diflucan and doxycycline I think this is already getting better. READMISSION T is a now 59 year old man who has advanced cerebral palsy and functional quadriplegia. We have had him in this clinic multiple times before for wounds erry on his right ankle, left ankle, left dorsal foot and most recently from 08/02/16 through 01/30/17 with his left medial ankle and left foot and finally right second toe. His attendant from the group home where he lives is very knowledgeable about him. She states that they noticed breakdown in his skin in his feet sometime in January although they could not get an appointment until today in this clinic. The previous wounds have always been felt to be secondary to incidental trauma/pressure areas  or possibly some degree of chronic venous insufficiency. He had normal arterial studies in may of 2016. He has definite open wounds on the right lateral foot over the base of the fifth metatarsal, the right second toe DIP, the left medial malleolus. He has concerning areas over the left lateral fifth metatarsal base, retaform purpura over the medial left ankle and perhaps the medial left first toe. 07/21/17; culture we did last week from the base of the right fifth metatarsal grew group G strep. This should've been sensitive to the doxycycline I gave him. The other major wounds are on the right second toe DIP, left medial malleolus, base of the left lateral fifth metatarsal and the medial left ankle and medial left toe. 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 We have macrovascular arterial studies scheduled for Thursday although I don't think this is what the problem is. 07/28/17; patient arrives with really no improvement. He has a wound on the right second toe, lateral right foot, right medial malleolus which was new this week, left medial malleolus, but looked like a DTI on the left great toe. His macrovascular studies are on 07/30/17 although I would be  surprised if this provides all the explanation. He has small areas even on his right second and third toe which look like splinter-type hemorrhages. The question I have is where is this coming from. I'd like to see the vascular studies however before we pursue this. He has had a DVT in the past but is no longer on anticoagulants. I'll need to review his history 08/04/17; the patient had his ARTERIAL STUDIES this showed a right ABI of 1.16 and the left ABI of 1.20. TBI on the left at 0.84. He is at a right first toe amputation. The studies were unchanged from studies in May 2016. It was not felt that he had significant lower extremity arterial disease In the meantime we are using silver alginate to the wounds on the right second toe right lateral foot left medial malleolus. I'm going to change to Silver collagen today. 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 Working him up for this would include an echocardiogram probably a CT scan of the chest and abdomen to look at the major thoracic and abdominal aortic vessels. I am not going to involve myself in this unless there is more symptoms/signs 08/18/17; the patient arrives today with wounds looking somewhat better. The remaining areas are the right second toe dorsal, right lateral foot and left medial malleolus which only has a small open area. We've been using silver collagen 09/01/17;right second toe dorsally probes to bone today. This is a deterioration. We've been using silver collagen. Only a small open area remains on the left medial malleolus. 09/09/17; right second toe appears better. There is no probing bone. He has a small area remaining on the left medial malleolus, the right lateral foot. We have been using silver collagen 10/07/17; the patient returns to clinic today after a month hiatus. He has wounds over the right  second toe, right lateral foot and the left medial malleolus. We have been using silver collagen. He has home health. 10/24/17; patient has open wounds over the right second toe, now the left fourth toe and a superficial area over his left buttock. Some of the other areas have healed including the left medial malleolus and right lateral foot. Using silver collagen on these  wounds 11/07/17; the patient has closed his wounds on the right second toe and left fourth toe although they look all normal. Last time he was here he had a superficial area over the left buttock this is also closed. The right lateral foot wound I had closed out last time although it is seems to have reopened today. 628/19 the areas over the right second and left fourth toe remains closed. The right lateral foot has tightly adherent necrotic surface over. We have been using silver collagen he offloads this and a bunny boot 12/12/17; the patient has reopened the recurrent area over the right second PIP joint. 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 He does not have obvious macrovascular disease. The area over the right lateral foot looks about the same as last time tightly adherent necrotic debris. We've been using Hydrofera Blue 12/25/17; 2 week follow-up. The area over the right second PIP is closed however the area on the right lateral foot is worse with surrounding erythema and pain compatible with cellulitis is going to need an antibiotic. AS WELL..... We have looked at his buttocks and lower back. He has 3 stage II ulcers. The exact history here is unclear however he is very disabled man with cerebral palsy. He goes to a day program from the group home in which she lives 5 days a week and I think he is on his buttock all day on these visits. Surrounding the wounds see has extensive stage I injury as well. FINALLY it looks as though he has lost weight. I'm not the only  staff member that is recognized this 01/08/18 on evaluation today patient appears to be doing better in regard to his gluteal wounds. In fact everything appears to be healed back here although very dry. He has a couple open sores on the scrotal region other than this it is really his foot that is still giving him the most trouble. He did complete the Keflex unfortunately it seems like there's still a lot of erythema in this in fact looks worse than it did previous. I'm gonna culture this today and likely start with a different antibiotic. 01/16/18; I was reassured that the wounds on his buttocks and sacrum are healed. I did not look at these the day. The culture that was done last week on 01/11/18 of the right lateral foot wounds showed Pseudomonas. Bactrim is not a good choice for this. Ciprofloxacin interacts with his muscle relaxants therefore I prescribed cefdinir 300 twice a day for 7 days. Bactrim coverage of pseudomonas is not reliable although the degree of erythema looks a lot better 01/30/18; the patient has completed his antibiotics. The area does not have infection on the right lateral foot. The wounds look a lot better. We've been using silver alginate 02/12/18; there is on the right lateral foot. Copious amounts of surface eschar. Also similarly over the PIP of the right second toe. We've been using silver alginate to date 03/02/2018; the area that remains is on the right lateral foot. The right second toe is healed. 03/16/2018; the area there is original wound on the right lateral foot. Still requiring debridement. We are using Hydrofera Blue. The right second toe is still healed/closed 03/30/2018; right lateral foot there is 2 small open areas remaining. They actually look quite healthy we have been using Hydrofera Blue the right second toe remains closed 04/13/2018; right lateral foot. 1 of the 2 small open  areas from last time has closed over. The other had surface slough that I removed  with a #3 curette. This looks quite healthy. The right second toe remains closed. We are using Hydrofera Blue 05/01/2018; right lateral foot very superficial areas that look like they are trying to close over. He had a new threatened area on the left medial ankle just below the medial malleolus. I think these represent chronic venous insufficiency areas 05/14/2018; the right right lateral foot looks like it closed over as was predicted last time however he has some dark areas subcutaneously erythema superiorly and this is really very tender. This is either coexistent cellulitis or perhaps a deep tissue injury. The area on the left medial ankle is superficial but has some size. We have been using Hydrofera Blue to both areas. The patient complains of pain in his right foot 06/09/2018; right lateral foot was closed over last time but there was coexistent cellulitis around this. I gave him Levaquin. He also has an area on the left medial malleolus. He has not been back to clinic in over 3-1/2 weeks. We are using Hydrofera Blue I think to the left medial malleolus and silver alginate to the right lateral foot 1/21; right lateral foot still non-viable debris over this area and the left medial malleolus. Both of these vigorously washed off with antiseptic gauze. We have been using silver alginate. 2/4; right lateral foot this looks somewhat better. Unfortunately the area over the left medial malleolus has deteriorated. Using silver alginate in both area 2/18; both wounds arrived today covered in a lot of necrotic debris. We have been using silver alginate. Clearly not making a lot of progress. 3/3; the patient's right second toe is reopened over the PIP. I am not sure why this is happening. He may have microvascular disease. We have checked his macrovascular flow in the past and found it to be normal. He may need an x-ray. The area on the right lateral foot requires debridement. The area on the left lateral  ankle is worse. We have been using PolyMem Ag 08/26/18 on evaluation today patient appears to be doing a little worse in regard to his lower extremities based on what I'm seeing what the nursing staff is telling me. His left medial ankle appears potential to be infected there is your thing on want surrounding the wound. His right second toe is also of concern at this point in my pinion. We have not done x-rays of these areas that may be a good idea to go ahead and proceed with at this point to ensure will not deal with anything such as osteomyelitis. 4/16; it is been a while since I saw this patient. He now has wounds on the right lateral foot, dorsal right second toe, substantially on the left medial malleolus and a small area on the left lateral foot. We have been using PolyMem Silver. Curlex and Coban. He has home health changing the dressing. Since last time he was here he had a culture of the left medial ankle that showed MRSA. He is completed a course/7 days of doxycycline. XRAYS of the left ankle and right foot that were ordered were negative for osteomyelitis 4/30; he arrives in clinic today with some maceration around the wound and a lot of tenderness and erythema in a circumferential area around the right lateral foot. He has wound areas on the right lateral foot dorsal right second toe, largely over the left medial malleolus and a small area over  the left lateral foot 5/7; completed his antibiotics from last time right lateral foot cellulitis is better and the area on his left knee which we discovered after he had been seen by the provider also is improved. He still has extensive wound areas over the right lateral foot, left medial malleolus, right second toe and left lateral foot although all of these look somewhat better with PolyMem 5/15. Wounds on the right lateral foot, right second toe left medial malleolus and the left lateral foot. All of these look somewhat better. We have been  using PolyMem Ag 5/26; the wound on the right lateral foot is almost closed over but he still complains of a lot of pain in this area. He has a eschared area on the right second toe but no open wound.. Also concerning today his original wound on the left medial malleolus looks healthy however just inferior to this almost areas of deep tissue injury and there is an area on the left lateral foot also looks like a deep tissue injury. In the past I have wondered whether he might have cholesterol emboli or some other form of a vasculopathy. I wonder whether this could have something to do with this. 6/2; right lateral foot wound is still open I gave him empiric antibiotics last week without really a lot of change she is still complaining of pain in this area. We x- rayed his feet at some point in April these were negative. It would be difficult to do an MRI. The area on his left medial malleolus looks better. Right second toe is healed. He still has what looks to be a small pressure related area on the left lateral foot but it is not open. We are using PolyMem to all wound which seems to be doing nicely 6/16; right lateral foot wound is still open. The wound looks better although there is still a lot of tenderness around it. Rather than more empiric antibiotics I have cultured this today. We are using PolyMem Ag. On the left lateral malleolus the wound appears better. He has a new area on the left lower anterior pretibial area 6/29; 2-week follow-up. Culture I did of the right lateral foot 2 weeks ago showed MRSA. I gave him 2 weeks of Septra which he should be just about finishing now and this seems better. We got a call from home health last week to report swelling in the left leg because we were listed as his primary. We referred him back to primary care. He arrives today with intense erythema and tenderness around the 2 wounds on the left medial ankle on the left lower calf. The area on the right  lateral foot looks better 7/7; I brought him back this week to follow-up on his left foot which had cellulitis last week. I gave him clindamycin which as far as I know he tolerated. The erythema is better. He has a 3 current wounds one on the right lateral foot a small area on the right lateral malleolus and the more recent area on the left anterior pretibial area. 7/21; bilateral distal lower leg and foot wounds. The area on his right lateral foot looks a lot better. He has an area on his left pretibial area distally. The area on the medial malleolus on the left which is 1 of his worst wounds has closed over. He does not appear to have any open areas in his remaining toes 8/4-Patient returns at 2 weeks, he has the remaining wound on  the left lower leg the other 2 wounds have healed. Patient is continued to be treated with silver alginate with a 2 layer wrap on the left 8/18; I been following this patient for a long period for bilateral lower extremity wounds. 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 His wounds are probably pressure although he religiously wears thick bunny boots. He does not have macrovascular disease but I did formal testing on him perhaps 2 or 3 years ago. I have often wondered about either cholesterol emboli or a vasculopathy of some sort given the distal nature of these variable locations etc. He has 2 areas that we have been following one on the left medial lower leg and one on the right lateral foot. Both of these are mostly healed today there is some eschar over sections of both areas although I did not disturb this. 03/31/2019 on evaluation today patient appears to be doing a little bit more poorly in regard to the sacral region. He has 2 small wounds noted at this point. Fortunately there is no signs of active infection at this time. No fevers, chills, nausea, vomiting, or diarrhea. He is having some pain when he sits on  his gluteal region a big part of the reason he has these wounds is likely due to the fact that his wheelchair has not been functioning properly. He does still have a couple areas of deep tissue injury on his lower extremities he does need new Prevalon offloading boots as well. 04/28/2019 on evaluation today patient appears to be doing well with regard to his wounds in particular. His ankle area has fairly small based on what I am seeing currently and his sacral region actually is still open but does not appear to be doing too poorly in my opinion. Fortunately there is no signs of active infection at this time. He was in the hospital unfortunately and this was from 04/08/2019 through 04/12/2019 secondary to persistent diarrhea, dehydration and acute kidney injury. With that being said fortunately the patient does not seem to be doing too poorly at this time which is good news. He is having pain mainly in the sacral region. 06/23/2019 on evaluation today patient appears to be doing well with regard to his sacral region which in fact is completely healed. Unfortunately he has an area of rubbing/pressure on his right lateral chest/back region. This is where it is rubbing up on his chair. Subsequently he also has an open wound on the left medial malleolus which is not very open but starting to crack and then on the right lateral foot this is much more open at this point. Unfortunately there is no signs of significant improvement in regard to these areas and in fact there does appear to be evidence of active infection quite significantly. No fevers, chills, nausea, vomiting, or diarrhea. The patient did test positive for COVID-19 that has been greater than 14 days ago although they did not know the exact timing. That is why he was not here for his last appointment. 07/07/2019 upon evaluation today patient actually appears to be doing quite well in regard to his left ankle as well as the chest/back region which  is doing great in fact appears to be healed in my opinion. He still has an issue on his lateral right foot that is open and appears to be infected but does have me more concerned. In fact I do believe the doxycycline helped in general for the  cellulitis but I believe that may have been more MRSA related based on the culture that we did. What I am seeing right now has more of the blue-green drainage and may be more consistent with a Pseudomonas that I was hoping was not really causing much of an issue but I think it may be at this point on the right lateral foot. For that reason I do think treatment would be a good idea of the reason I avoided the Cipro before was the potential for QT prolongation which could obviously cause problems with the patient with results of interaction with respiratory wound. The tizanidine also had some interactions as well. Nonetheless I think at this point that it would be a possibility for [place] to use topical gentamicin to see if this could be of benefit for the patient underneath the silver alginate dressing. 07/21/2019 upon evaluation today patient appears to be doing fairly well at this point in regard to his lower extremity on the right. He does have a wound on his right lateral foot as well as the right fifth toe and the right fourth toe. Unfortunately he continues to experience significant issues with pressure and trauma to some degree and I think this is just due to the fact that again he is not really not able to control his legs and what is happening here. Fortunately there is no signs of active infection at this time. 08/04/2019 upon evaluation today patient actually appears to be doing quite well with regard to his toe ulcers I am very pleased in that regard. Unfortunately the foot ulcer is showing some signs of erythema his primary care provider has placed him on clindamycin at this point. Again I do believe this can be beneficial for him. With that being said I do  think the patient may benefit from a light Kerlix and Coban wrap to help with some of his edema at this point. 08/18/2019 upon evaluation today patient appears to be doing still poorly in regard to his lateral foot ulcer. This is not very deep but there is some concern about the possibility of osteomyelitis. I think it would be appropriate for [place] to go ahead and have an x-ray at this time. The patient is currently on clindamycin which from his culture which was performed on 06/23/2019 it does appear that he would benefit from that in regard to the MRSA. However Pseudomonas was also identified then and the patient actually has some blue-green drainage at this point as well. I am much more concerned about the possibility of the Pseudomonas being an infection is causative agent at this point that I am the MRSA. Subsequently Cipro the patient was sensitive to but we never use that is stable use a topical gent due to the fact that unfortunately there was some interaction between the Cipro and the patient's risk for down potentially causing QT prolongation. 08/31/2019; have not seen this patient in quite a period of time. He has a substantial wound on his right lateral foot we have been using gentamicin ointment with Hydrofera Blue over the top. From what I am able to determine he is also on antibiotics perhaps clindamycin although I have not had a chance to look over this. He also has an eschared area on the right fifth toe as well as the right second toe. 4/20; right lateral foot wound. He comes in today with intense erythema and tenderness around this wound. The wound itself has a surface on it but it doesn't  look particularly healthy a lot of gelatinous debris. There is no palpable bone. We've been using gentamicin ointment and Hydrofera Blue. Since the patient was last here he was seen by Dr. Algis Liming of infectious disease. He ordered an MRI that I think is due later this month on 4/30. He apparently also  checked a sedimentation rate and C-reactive protein metabolic panel CBC with differential. At the time he was seen he wanted to withhold any microbials pending identification of the depth of the infection although looking at this I don't know that I'm going to be able to hold off for another 10 days. 4/29; he went on to have the MRI that was ordered by Dr. Algis Liming of infectious disease. 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 I had given him cefdinir last week for surrounding cellulitis he is finishing this today. I have communicated with Dr. Algis Liming who wants bone for pathology and culture before proceeding with consideration of IV antibiotics. Fortunately he is due to see Dr. Samuella Cota of podiatry today I am hopeful that the bone biopsy and culture will be considered there 5/4 as I understand things Dr. Samuella Cota actually wants to remove the diseased proximal fifth metatarsal. Looking back at this decision is probably not a bad one. He has good blood flow he is nonambulatory they are apparently going to do this next week. Paradoxically the wound on the lateral part of his foot is actually a lot better. The cellulitis that was so problematic 2 weeks ago also has resolved. 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 The last time I saw him he had a diseased proximal fifth metatarsal. 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 Samuella Cota. Culture of this showed Pseudomonas and he was followed by infectious disease Dr. Algis Liming treated with ciprofloxacin. 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 He had Integra placed. A CandS showed Pseudomonas again. Since then he has had Silvadene as a primary dressing, then Santyl and more recently Xeroform and then back to Santyl 3 times a  week per his attendant who comes with him. Reviewing his records I was really not prepared for the number of wounds that he currently has. This includes substantial areas on the right lateral foot, right lateral ankle, right fourth toe dorsally, right ankle anteriorly as well as the left lateral ankle and foot. His medical history is unchanged ABIs 1.2 on the right and 1.21 on the left he has not been known to have arterial insufficiency and is not a diabetic 11/5; patient I readmitted to the clinic last week. He has multiple wounds on the right lateral foot, right fourth toe and a new area on the right second toe. He has areas on the right dorsal ankle and a substantive wound over the left lateral ankle and foot. We are using silver collagen. We put him under compression last week and this seems to have helped. He has home health coming out one other time per week to change the dressing 11/12; no real improvement. Multiple bilateral foot and ankle wounds. On the left he has a substantial wound on the left lateral ankle On the right he has wounds on the dorsal second and fourth toes. Right lateral foot we have merged to wounds these are necrotic She  has an area on the right dorsal ankle and a area on the right medial malleolus with marked surrounding erythema likely cellulitis. The patient complains about pain that kept him awake all last night from wraps that were too tight. He is not known to have arterial insufficiency. 11/18; no real change. I gave him empiric Levaquin last week directed at previously cultured Pseudomonas this is not really helped. On the right he has wounds on the right lateral foot right lateral ankle right medial ankle and the dorsal second and fourth toes. Most of these do not have a nice have a healthy surface. There is erythema in his foot which may all be venous stasis. The area on the left lateral malleolus/ankle area looks better 12/3; really disappointing if anything  deterioration especially on the right lateral foot. These are necrotic. He also has areas on the right dorsal ankle right medial malleolus and the right second and fourth toes. On the left he has a large area over the left medial ankle. There is some erythema around the wound. Things are very tender I have reviewed his most recent arterial Dopplers which are actually in 2019 but he has never been felt to have arterial issues. At that time his ABI on the right was 1.16 [no TBI due to amputation] on the left his ABI was 1.20 with a great toe pressure of 0.24. Waveforms on the right and left toes were all normal he was not felt to have significant evidence of lower extremity arterial disease. ABIs in our clinic have always been normal his pulses have been palpable. He had an MRI of his right foot in April 2021 this showed a soft tissue ulceration along the lateral midfoot with underlying osteomyelitis no abscess. 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 He returned to our clinic on 03/23/2020. The same pattern of wounds that I am describing currently this was a lot worse than earlier in the year. I gave him empiric antibiotics because he had had Pseudomonas in the past this does not seem to have helped. We have not made any progress on any of these wound site. The patient is in a lot of pain 12/10; the patient comes in with a new deep tissue injury on the right heel large necrotic wound on the right lateral foot, smaller necrotic wound on the dorsal ankle and medial ankle. Necrotic wounds on the second and fourth toes. We have been using Hydrofera Blue On the left he has the area on the left little medial ankle which actually looks some better healthier looking tissue. We are using Hydrofera Blue here as well X-ray ordered last week showed no osseous erosion no plain film indication of osteomyelitis. 12/17; PCR culture I did of the right lateral foot wound last week showed a large titer of  Enterococcus faecalis and low titers of of coag negative staph and Peptostreptococcus. I have started him on Augmentin for 2 weeks but I may continue beyond this. He has continuous complaints of pain now apparently making it difficult for him to rest at night. Substantial wound area in the right foot and ankle. Unfortunately the left medial foot seems to be more substantial this week. Electronic Signature(s) Signed: 05/12/2020 4:58:57 PM By: Baltazar Najjar MD Entered By: Baltazar Najjar on 05/12/2020 10:04:21 -------------------------------------------------------------------------------- Physical Exam Details Patient Name: Date of Service: [name], [name] 05/12/2020 8:45 A M Medical Record Number: [id number] Patient Account Number: [id number] Date of Birth/Sex: Treating RN: 1960/10/29 (59 y.o.  [name] Primary Care Provider: [name] Other Clinician: Referring Provider: Treating Provider/Extender: [name], [name] in Treatment: 7 Constitutional Sitting or standing Blood Pressure is within target range for patient.. Pulse regular and within target range for patient.Marland Kitchen Respirations regular, non-labored and within target range.. Temperature is normal and within the target range for the patient.Marland Kitchen Appears in no distress. Cardiovascular Dorsalis pedis pulses are palpable. Notes Wound exam On the left the wound medially is more extensive there is been extension superiorly. I am really not sure why this is. On the right all the wounds appear to have less inflammation suggesting that the Augmentin is been effective. The area right laterally looks somewhat improved over the surface is nonviable. Right dorsal ankle nonviable surface, second and fourth toes are about the same. Medial ankle about the same. Electronic Signature(s) Signed: 05/12/2020 4:58:57 PM By: Baltazar Najjar MD Entered By: Baltazar Najjar on 05/12/2020  10:05:32 -------------------------------------------------------------------------------- Physician Orders Details Patient Name: Date of Service: RAIFE, LIZER 05/12/2020 8:45 A M Medical Record Number: [id number] Patient Account Number: [id number] Date of Birth/Sex: Treating RN: 1961-02-26 (59 y.o. 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 - Monday 12/27 Return A Bathing/ Shower/ Hygiene May shower with protection but do not get wound dressing(s) wet. Edema Control - Lymphedema / SCD / Other Bilateral Lower Extremities Elevate legs to the level of the heart or above for 30 minutes daily and/or when sitting, a frequency of: Off-Loading Heel suspension boot to: - heel protectors at all times Other: - avoid any pressure to back of heels Home Health No change in wound care orders this week; continue Home Health for wound care. May utilize formulary equivalent dressing for wound treatment orders unless otherwise specified. Other Home Health Orders/Instructions: - [facility] Wound Treatment Wound #40 - Lower Leg Wound Laterality: Left, Medial Cleanser: Wound Cleanser (Home Health) 2 x Per Week/30 Days Discharge Instructions: Cleanse the wound with  wound cleanser prior to applying a clean dressing using gauze sponges, not tissue or cotton balls. Peri-Wound Care: Sween Lotion (Moisturizing lotion) (Home Health) 2 x Per Week/30 Days Discharge Instructions: Apply moisturizing lotion with dressing changes Prim Dressing: Hydrofera Blue Classic Foam, 4x4 in (Home Health) 2 x Per Week/30 Days ary Discharge Instructions: Apply to wound bed as instructed Secondary Dressing: ABD Pad, 5x9 (Home Health) 2 x Per Week/30 Days Discharge Instructions: Apply over primary dressing as directed. Compression Wrap: Kerlix Roll 4.5x3.1 (in/yd) (Home Health) 2 x Per Week/30 Days Discharge Instructions: Apply Kerlix and Coban compression , be sure to wrap from base of toes to tibial tuberosity. no too tight Compression Wrap: Coban Self-Adherent Wrap 4x5 (in/yd) (Home Health) 2 x Per Week/30 Days Discharge Instructions: Apply over Kerlix as directed. Wound #41 - T Fourth oe Wound Laterality: Right Cleanser: Wound Cleanser (Home Health) 2 x Per Week/30 Days Discharge Instructions: Cleanse the wound with wound cleanser prior to applying a clean  dressing using gauze sponges, not tissue or cotton balls. Peri-Wound Care: Sween Lotion (Moisturizing lotion) 2 x Per Week/30 Days Discharge Instructions: Apply moisturizing lotion with dressing changes Topical: Bactroban (Home Health) 2 x Per Week/30 Days Discharge Instructions: apply to wound bed under alginate Prim Dressing: KerraCel Ag Gelling Fiber Dressing, 4x5 in (silver alginate) (Home Health) 2 x Per Week/30 Days ary Discharge Instructions: Apply silver alginate to wound bed as instructed Secondary Dressing: Woven Gauze Sponge, Non-Sterile 4x4 in (Home Health) 2 x Per Week/30 Days Discharge Instructions: Apply over primary dressing as directed. Secondary Dressing: ABD Pad, 5x9 (Home Health) 2 x Per Week/30 Days Discharge Instructions: Apply over primary dressing as directed. Compression Wrap: Kerlix Roll 4.5x3.1  (in/yd) (Home Health) 2 x Per Week/30 Days Discharge Instructions: Apply Kerlix and Coban compression as directed. Compression Wrap: Coban Self-Adherent Wrap 4x5 (in/yd) (Home Health) 2 x Per Week/30 Days Discharge Instructions: Apply over Kerlix as directed. Wound #42 - Malleolus Wound Laterality: Right, Medial Cleanser: Wound Cleanser (Home Health) 2 x Per Week/30 Days Discharge Instructions: Cleanse the wound with wound cleanser prior to applying a clean dressing using gauze sponges, not tissue or cotton balls. Peri-Wound Care: Sween Lotion (Moisturizing lotion) 2 x Per Week/30 Days Discharge Instructions: Apply moisturizing lotion with dressing changes Topical: Bactroban (Home Health) 2 x Per Week/30 Days Discharge Instructions: apply to wound bed under alginate Prim Dressing: KerraCel Ag Gelling Fiber Dressing, 4x5 in (silver alginate) (Home Health) 2 x Per Week/30 Days ary Discharge Instructions: Apply silver alginate to wound bed as instructed Secondary Dressing: Woven Gauze Sponge, Non-Sterile 4x4 in (Home Health) 2 x Per Week/30 Days Discharge Instructions: Apply over primary dressing as directed. Secondary Dressing: ABD Pad, 5x9 (Home Health) 2 x Per Week/30 Days Discharge Instructions: Apply over primary dressing as directed. Compression Wrap: Kerlix Roll 4.5x3.1 (in/yd) (Home Health) 2 x Per Week/30 Days Discharge Instructions: Apply Kerlix and Coban compression as directed. Compression Wrap: Coban Self-Adherent Wrap 4x5 (in/yd) (Home Health) 2 x Per Week/30 Days Discharge Instructions: Apply over Kerlix as directed. Wound #43 - Lower Leg Wound Laterality: Right, Anterior, Distal Cleanser: Wound Cleanser (Home Health) 2 x Per Week/30 Days Discharge Instructions: Cleanse the wound with wound cleanser prior to applying a clean dressing using gauze sponges, not tissue or cotton balls. Peri-Wound Care: Sween Lotion (Moisturizing lotion) 2 x Per Week/30 Days Discharge Instructions:  Apply moisturizing lotion with dressing changes Topical: Bactroban (Home Health) 2 x Per Week/30 Days Discharge Instructions: apply to wound bed under alginate Prim Dressing: KerraCel Ag Gelling Fiber Dressing, 4x5 in (silver alginate) (Home Health) 2 x Per Week/30 Days ary Discharge Instructions: Apply silver alginate to wound bed as instructed Secondary Dressing: Woven Gauze Sponge, Non-Sterile 4x4 in (Home Health) 2 x Per Week/30 Days Discharge Instructions: Apply over primary dressing as directed. Secondary Dressing: ABD Pad, 5x9 (Home Health) 2 x Per Week/30 Days Discharge Instructions: Apply over primary dressing as directed. Compression Wrap: Kerlix Roll 4.5x3.1 (in/yd) (Home Health) 2 x Per Week/30 Days Discharge Instructions: Apply Kerlix and Coban compression as directed. Compression Wrap: Coban Self-Adherent Wrap 4x5 (in/yd) (Home Health) 2 x Per Week/30 Days Discharge Instructions: Apply over Kerlix as directed. Wound #44 - Foot Wound Laterality: Right, Lateral, Proximal Cleanser: Wound Cleanser (Home Health) 2 x Per Week/30 Days Discharge Instructions: Cleanse the wound with wound cleanser prior to applying a clean dressing using gauze sponges, not tissue or cotton balls. Peri-Wound Care: Sween Lotion (Moisturizing lotion) 2 x Per Week/30 Days Discharge Instructions:  Apply moisturizing lotion with dressing changes Topical: Bactroban (Home Health) 2 x Per Week/30 Days Discharge Instructions: apply to wound bed under alginate Prim Dressing: KerraCel Ag Gelling Fiber Dressing, 4x5 in (silver alginate) (Home Health) 2 x Per Week/30 Days ary Discharge Instructions: Apply silver alginate to wound bed as instructed Secondary Dressing: Woven Gauze Sponge, Non-Sterile 4x4 in (Home Health) 2 x Per Week/30 Days Discharge Instructions: Apply over primary dressing as directed. Secondary Dressing: ABD Pad, 5x9 (Home Health) 2 x Per Week/30 Days Discharge Instructions: Apply over primary  dressing as directed. Compression Wrap: Kerlix Roll 4.5x3.1 (in/yd) (Home Health) 2 x Per Week/30 Days Discharge Instructions: Apply Kerlix and Coban compression as directed. Compression Wrap: Coban Self-Adherent Wrap 4x5 (in/yd) (Home Health) 2 x Per Week/30 Days Discharge Instructions: Apply over Kerlix as directed. Wound #46 - T Second oe Wound Laterality: Right Cleanser: Wound Cleanser (Home Health) 2 x Per Week/30 Days Discharge Instructions: Cleanse the wound with wound cleanser prior to applying a clean dressing using gauze sponges, not tissue or cotton balls. Peri-Wound Care: Sween Lotion (Moisturizing lotion) 2 x Per Week/30 Days Discharge Instructions: Apply moisturizing lotion with dressing changes Topical: Bactroban (Home Health) 2 x Per Week/30 Days Discharge Instructions: apply to wound bed under alginate Prim Dressing: KerraCel Ag Gelling Fiber Dressing, 4x5 in (silver alginate) (Home Health) 2 x Per Week/30 Days ary Discharge Instructions: Apply silver alginate to wound bed as instructed Secondary Dressing: Woven Gauze Sponge, Non-Sterile 4x4 in (Home Health) 2 x Per Week/30 Days Discharge Instructions: Apply over primary dressing as directed. Secondary Dressing: ABD Pad, 5x9 (Home Health) 2 x Per Week/30 Days Discharge Instructions: Apply over primary dressing as directed. Compression Wrap: Kerlix Roll 4.5x3.1 (in/yd) (Home Health) 2 x Per Week/30 Days Discharge Instructions: Apply Kerlix and Coban compression as directed. Compression Wrap: Coban Self-Adherent Wrap 4x5 (in/yd) (Home Health) 2 x Per Week/30 Days Discharge Instructions: Apply over Kerlix as directed. Radiology MRI, right foot and ankle with and without contrast - Non healing ulcers on right foot, rule out Osteomyelitis - (ICD10 L97.512 - Non-pressure chronic ulcer of other part of right foot with fat layer exposed) Electronic Signature(s) Signed: 05/12/2020 4:58:57 PM By: Baltazar Najjar MD Signed:  05/12/2020 5:46:53 PM By: [name] RN, BSN Entered By: [name] on 05/12/2020 10:02:44 Prescription 05/12/2020 -------------------------------------------------------------------------------- Madie Reno MD Patient Name: Provider: Oct 20, 1960 [id number] Date of Birth: NPI#: [name] [id number] Sex: DEA #: [phone] Phone #: License #: Eligha Bridegroom [facility] Patient Address: [address] [place], [place] [address] [address] [place], [place] [address] [phone] Allergies adhesive tape Provider's Orders MRI, right foot and ankle with and without contrast - ICD10: L97.512 - Non healing ulcers on right foot, rule out Osteomyelitis Hand Signature: Date(s): Electronic Signature(s) Signed: 05/12/2020 4:58:57 PM By: Baltazar Najjar MD Signed: 05/12/2020 5:46:53 PM By: [name] RN, BSN Entered By: [name] on 05/12/2020 10:02:49 -------------------------------------------------------------------------------- Problem List Details Patient Name: Date of Service: ZIQUAN, FIDEL 05/12/2020 8:45 A M Medical Record Number: [id number] Patient Account Number: [id number] Date of Birth/Sex: Treating RN: 22-Jun-1960 (59 y.o. [name] Primary Care Provider: [name] Other Clinician: Referring Provider: Treating Provider/Extender: [name], [name] in Treatment: 7 Active Problems ICD-10 Encounter Code Description Active Date MDM Diagnosis I89.0 Lymphedema, not elsewhere classified 03/23/2020 No Yes T81.31XD Disruption of external operation (surgical) wound, not elsewhere classified, 03/23/2020 No Yes subsequent encounter S90.811D Abrasion, right foot, subsequent encounter  03/23/2020 No Yes L97.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 layer exposed 03/23/2020 No Yes L97.312 Non-pressure chronic ulcer of right ankle with fat layer  exposed 03/23/2020 No Yes L97.322 Non-pressure chronic ulcer of left ankle with fat layer exposed 03/23/2020 No Yes L89.616 Pressure-induced deep tissue damage of right heel 05/05/2020 No Yes Inactive Problems ICD-10 Code Description Active Date Inactive Date L03.115 Cellulitis of right lower limb 04/07/2020 04/07/2020 Resolved Problems Electronic Signature(s) Signed: 05/12/2020 4:58:57 PM By: Baltazar Najjar MD Entered By: Baltazar Najjar on 05/12/2020 10:02:21 -------------------------------------------------------------------------------- Progress Note Details Patient Name: Date of Service: [name] 05/12/2020 8:45 A M Medical Record Number: [id number] Patient Account Number: [id number] Date of Birth/Sex: Treating RN: [birth year] (59 y.o. [name] Primary Care Provider: [name] Other Clinician: Referring Provider: Treating Provider/Extender: [name], [name] in Treatment: 7 Subjective History of Present Illness (HPI) 11/23/15; this is a patient I remember from a previous stay in this clinic in 2012. The patient says this was for a wound in this same area as currently although I have no information to verify that. Most of the information is provided by his caregiver from the group home where he resides. Apparently he has had a wound in this area for 8 months. He also has erythema around this area and has had multiple rounds of antibiotics apparently with some improvement but the erythema is persists. He apparently had a fracture in the ankle 2 months ago and was seen by Dr. Lajoyce Corners . He apparently had a splint applied and more recently a heel offloading boot. He had home health coming out to a week ago not clear what they are dressing this with but they apparently discharged him perhaps because of one of Dr. 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 He has a Nurse, adult for transferring at home he is  nonambulatory and does not wear footwear. He is not a diabetic. Looking through [facility] he appears to have fairly active primary care. The area on the ankle is variably labeled as a pressure area and/or chronic venous insufficiency. He had a recent venous ultrasound on 5/25 that did not show a DVT in the left leg however this was not a reflux study. There was no superficial DVT either. Arterial studies on 10/15/14 showed a left ABI of 1.16 Center right of 1.12 waveforms were triphasic he does not have significant arterial disease. He has had recent x-rays of the left foot and ankle. The left foot did not show any abnormality. Left ankle x-ray showed a spiral fracture of the left tibial diaphysis without evidence of osteomyelitis. 11/30/15 culture I did last week showed pseudomonas I changed him to oral ciprofloxacin. Erythema is a lot better. 12/07/15 area around the wound shows considerable improvement of the periwound erythema. 12/14/15; the patient has a improvement of the periwound erythema which in retrospect I think with cellulitis successfully treated. We have been using Iodoflex started last week 12/25/15 wound on the left medial malleolus and dorsal left foot. Theraskin #1 applied READMISSION 02/27/16; the patient was admitted to hospital from 8/14 through 01/18/16 initially felt to have cellulitis of his left lower leg however he was noted to have significant persistent pain and swelling. A duplex ultrasound was ordered that was negative. Obie Dredge an x-ray was done that showed a spiral fracture of the left leg. With posterior displacement and angulation at the mid left tibia. Orthopedics saw the patient and  splinted the leg. He was then sent to [facility] skilled facility and only return to his group home last week. He has wounds on the left medial malleolus, left dorsal foot and a worrisome area on the plantar left heel which is not really open but may represent a hematoma or a  DTI 03/06/16; areas of left anterior and medial ankle both look improved. There are 3 small wounds here. He has a area over the dorsal left first metatarsal head which is not open. In meantime his left heel has opened 03/14/16 the medial left ankle wounds looks stable to improved. The area on the posterior left heel has opened up and had a very adherent eschar and slept over this 03/21/16 patient has 3 wounds on his left medial ankle is stable area on his left dorsal first metatarsal head appears to be improved. Continued problems with adherent eschar over the left heel 03/28/16; patient has 3 wounds on his left medial ankle and lower leg all of these appear to be stable. The problem continues to be the left heel and the adherent eschar. This may have been a pressure ulcer from her brace at one point. He has a English as a second language teacher we have been using [employer] and all wound areas. Dressings are being changed by home health 04/04/16; the patient's areas on the medial aspect of the left ankle looks superficial and appear to be gradually closing. The area on the plantar aspects/Achilles aspect of his left heel as surface slough and nonviable subcutaneous tissue requiring debridement. 04/11/16; the areas on the patient's medial left ankle appear to be superficial and appear to be closing. The area on the plantar heel on the left has surface slough and nonviable subcutaneous tissue requiring debridement. I don't think this is too much different from last week there is also undermining 04/25/16; patient is followed in his group home by [facility] health. The area on the plantar heel is smaller but with still with some depth. We're using Iodoflex to this. The areas on the left ankle looks healthy but dry and somewhat larger. Cause of this is not really clear using Prisma 05/02/16; areas x3 all look improved expecially the areas on the medial ankle. Heel wound is smaller but still deep 05/09/16; all areas o3  look improved on the medial ankle. Heel wound is also contracting. 12/0.9/17 the areas on his left medial ankle are all closed. I think these are largely chronic venous insufficiency however there is no edema. The area on his left heel was a pressure area. READMISSION 08/02/16; this man is a patient that we have had in this clinic on several times before. I believe he has cerebral palsy is nonambulatory. He has chronic venous insufficiency with venous inflammation. He was last year in the fall of 2017 with areas on his left medial ankle left heel and left dorsal foot. He arrives today with once again a vague history. As mentioned he is nonambulatory he spends most of his day in a pressure offloading boot nevertheless they noted wounds in this area on the medial left ankle 2 weeks ago. There is any other history here I am uncertain 08/15/16; the patient's wounds on his medial ankle actually look better however the area on the left lateral/dorsal foot is covered in black necrotic eschar. Previous formal arterial studies did not show significant arterial disease with normal ABIs and triphasic waveforms. He is immobilized and at risk for pressure although he seems aware protective boots at all  times as far as I am aware. 08/22/16; in general his wounds look better. All the wounds on his medial left ankle look improved the area on his lateral foot however continues to be problematic. Patient comes in the clinic today in a new wheelchair he is quite delighted 09/05/16; a wound on the left medial Calf and left dorsal foot are healed. The area on the left lateral foot is improved however the area on the left medial malleolus is inflamed with a considerable degree of surrounding erythema. This looks like cellulitis. ABIs and clinical exam are within normal limits 09/12/16; the patient continues to have a wound on the left medial calf. I was concerned about cellulitis in this area last week and gave him a course  of doxycycline. The wound looks better here. The area on the left lateral foot however is unchanged 09/20/16; the patient came in this week early after traumatizing his toe on the left foot on a table while moving around in his motorized wheelchair. 09/26/16- he is here for follow-up evaluation of his left foot and malleolus ulcers. Since his appointment he abraded his left medial hallux against a table while in his motorized wheelchair, apparently trying to pull up to the table for a meal. He continues to complain of tenderness to the left lateral foot, surrounding the current ulcer 10/07/16; left foot and malleolus ulcers. 10/14/16; the patient is doing well. He has a wound on the left lateral, left medial malleolus and atraumatic wound on the left great toe. Although these appear to be superficial and closing 10/31/16; the patient is doing well. The area on the left lateral foot, left medial malleolus and the left great toe or all epithelialized. He has home health 11/14/16; patient arrives today with the area on the left great toe healed. The left lateral foot however in the left medial malleolus and the ankle superficial wounds that are slightly larger. He has erythema and tenderness around the area on the left lateral foot. As well he is complaining of dysuria. His caregiver notes that when he gets like this he is more lethargic which indeed seems to be the case today 11/21/16; left great toe is still healed. His wound on the left lateral foot appears healthy and slightly smaller also the area on the left medial ankle. This had black eschar on it which I removed. The remaining wound looks satisfactory 12/12/16; patient hasn't been here in a number of weeks. The area on the left lateral foot is healed however on his medial ankle has 2 open areas one of them covered with a necrotic eschar. He also has a new wound on the right second toe which was apparently trauma induced while he was at a movie 12/27/16;  the patient has wounds on his left medial mallelus and left lateral foot that are just about closed. The area on the right second toe which was trauma from last time is still open but again everything looks a lot better here. 01/30/17; patient arrives in clinic today and is really been a long time since I've seen him. The wounds are all epithelialized on the left foot this includes his left lateral foot, left medial malleolus. Apparently was in the ER for what of I think was a fungal infection on his buttock's he was prescribed Diflucan and doxycycline I think this is already getting better. READMISSION T is a now 58 year old man who has advanced cerebral palsy and functional quadriplegia. We have had him in this clinic  multiple times before for wounds erry on his right ankle, left ankle, left dorsal foot and most recently from 08/02/16 through 01/30/17 with his left medial ankle and left foot and finally right second toe. His attendant from the group home where he lives is very knowledgeable about him. She states that they noticed breakdown in his skin in his feet sometime in January although they could not get an appointment until today in this clinic. The previous wounds have always been felt to be secondary to incidental trauma/pressure areas or possibly some degree of chronic venous insufficiency. He had normal arterial studies in may of 2016. He has definite open wounds on the right lateral foot over the base of the fifth metatarsal, the right second toe DIP, the left medial malleolus. He has concerning areas over the left lateral fifth metatarsal base, retaform purpura over the medial left ankle and perhaps the medial left first toe. 07/21/17; culture we did last week from the base of the right fifth metatarsal grew group G strep. This should've been sensitive to the doxycycline I gave him. The other major wounds are on the right second toe DIP, left medial malleolus, base of the left lateral fifth  metatarsal and the medial left ankle and medial left toe. 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 We have macrovascular arterial studies scheduled for Thursday although I don't think this is what the problem is. 07/28/17; patient arrives with really no improvement. He has a wound on the right second toe, lateral right foot, right medial malleolus which was new this week, left medial malleolus, but looked like a DTI on the left great toe. His macrovascular studies are on 07/30/17 although I would be surprised if this provides all the explanation. He has small areas even on his right second and third toe which look like splinter-type hemorrhages. The question I have is where is this coming from. I'd like to see the vascular studies however before we pursue this. He has had a DVT in the past but is no longer on anticoagulants. I'll need to review his history 08/04/17; the patient had his ARTERIAL STUDIES this showed a right ABI of 1.16 and the left ABI of 1.20. TBI on the left at 0.84. He is at a right first toe amputation. The studies were unchanged from studies in May 2016. It was not felt that he had significant lower extremity arterial disease In the meantime we are using silver alginate to the wounds on the right second toe right lateral foot left medial malleolus. I'm going to change to Silver collagen today. 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 Working him up for this would include an echocardiogram probably a CT scan of the chest and abdomen to look at the major thoracic and abdominal aortic vessels. I am not going to involve myself in this unless there is more symptoms/signs 08/18/17; the patient arrives today with wounds looking somewhat better. The remaining areas are the right second toe dorsal, right lateral foot and left medial malleolus which only  has a small open area. We've been using silver collagen 09/01/17;right second toe dorsally probes to bone today. This is a deterioration. We've been using silver collagen. Only a small open area remains on the left medial malleolus. 09/09/17; right second toe appears better. There is no probing bone. He has a small area remaining on the left medial malleolus, the  right lateral foot. We have been using silver collagen 10/07/17; the patient returns to clinic today after a month hiatus. He has wounds over the right second toe, right lateral foot and the left medial malleolus. We have been using silver collagen. He has home health. 10/24/17; patient has open wounds over the right second toe, now the left fourth toe and a superficial area over his left buttock. Some of the other areas have healed including the left medial malleolus and right lateral foot. Using silver collagen on these wounds 11/07/17; the patient has closed his wounds on the right second toe and left fourth toe although they look all normal. Last time he was here he had a superficial area over the left buttock this is also closed. The right lateral foot wound I had closed out last time although it is seems to have reopened today. 628/19 the areas over the right second and left fourth toe remains closed. The right lateral foot has tightly adherent necrotic surface over. We have been using silver collagen he offloads this and a bunny boot 12/12/17; the patient has reopened the recurrent area over the right second PIP joint. 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 He does not have obvious macrovascular disease. The area over the right lateral foot looks about the same as last time tightly adherent necrotic debris. We've been using Hydrofera Blue 12/25/17; 2 week follow-up. The area over the right second PIP is closed however the area on the right lateral foot is worse with surrounding  erythema and pain compatible with cellulitis is going to need an antibiotic. ooAS WELL..... We have looked at his buttocks and lower back. He has 3 stage II ulcers. The exact history here is unclear however he is very disabled man with cerebral palsy. He goes to a day program from the group home in which she lives 5 days a week and I think he is on his buttock all day on these visits. Surrounding the wounds see has extensive stage I injury as well. ooFINALLY it looks as though he has lost weight. I'm not the only staff member that is recognized this 01/08/18 on evaluation today patient appears to be doing better in regard to his gluteal wounds. In fact everything appears to be healed back here although very dry. He has a couple open sores on the scrotal region other than this it is really his foot that is still giving him the most trouble. He did complete the Keflex unfortunately it seems like there's still a lot of erythema in this in fact looks worse than it did previous. I'm gonna culture this today and likely start with a different antibiotic. 01/16/18; I was reassured that the wounds on his buttocks and sacrum are healed. I did not look at these the day. The culture that was done last week on 01/11/18 of the right lateral foot wounds showed Pseudomonas. Bactrim is not a good choice for this. Ciprofloxacin interacts with his muscle relaxants therefore I prescribed cefdinir 300 twice a day for 7 days. Bactrim coverage of pseudomonas is not reliable although the degree of erythema looks a lot better 01/30/18; the patient has completed his antibiotics. The area does not have infection on the right lateral foot. The wounds look a lot better. We've been using silver alginate 02/12/18; there is on the right lateral foot. Copious amounts of surface eschar. Also similarly over the PIP of the right second  toe. We've been using silver alginate to date 03/02/2018; the area that remains is on the right lateral  foot. The right second toe is healed. 03/16/2018; the area there is original wound on the right lateral foot. Still requiring debridement. We are using Hydrofera Blue. The right second toe is still healed/closed 03/30/2018; right lateral foot there is 2 small open areas remaining. They actually look quite healthy we have been using Hydrofera Blue the right second toe remains closed 04/13/2018; right lateral foot. 1 of the 2 small open areas from last time has closed over. The other had surface slough that I removed with a #3 curette. This looks quite healthy. The right second toe remains closed. We are using Hydrofera Blue 05/01/2018; right lateral foot very superficial areas that look like they are trying to close over. He had a new threatened area on the left medial ankle just below the medial malleolus. I think these represent chronic venous insufficiency areas 05/14/2018; the right right lateral foot looks like it closed over as was predicted last time however he has some dark areas subcutaneously erythema superiorly and this is really very tender. This is either coexistent cellulitis or perhaps a deep tissue injury. The area on the left medial ankle is superficial but has some size. We have been using Hydrofera Blue to both areas. The patient complains of pain in his right foot 06/09/2018; right lateral foot was closed over last time but there was coexistent cellulitis around this. I gave him Levaquin. He also has an area on the left medial malleolus. He has not been back to clinic in over 3-1/2 weeks. We are using Hydrofera Blue I think to the left medial malleolus and silver alginate to the right lateral foot 1/21; right lateral foot still non-viable debris over this area and the left medial malleolus. Both of these vigorously washed off with antiseptic gauze. We have been using silver alginate. 2/4; right lateral foot this looks somewhat better. Unfortunately the area over the left medial  malleolus has deteriorated. Using silver alginate in both area 2/18; both wounds arrived today covered in a lot of necrotic debris. We have been using silver alginate. Clearly not making a lot of progress. 3/3; the patient's right second toe is reopened over the PIP. I am not sure why this is happening. He may have microvascular disease. We have checked his macrovascular flow in the past and found it to be normal. He may need an x-ray. The area on the right lateral foot requires debridement. The area on the left lateral ankle is worse. We have been using PolyMem Ag 08/26/18 on evaluation today patient appears to be doing a little worse in regard to his lower extremities based on what I'm seeing what the nursing staff is telling me. His left medial ankle appears potential to be infected there is your thing on want surrounding the wound. His right second toe is also of concern at this point in my pinion. We have not done x-rays of these areas that may be a good idea to go ahead and proceed with at this point to ensure will not deal with anything such as osteomyelitis. 4/16; it is been a while since I saw this patient. He now has wounds on the right lateral foot, dorsal right second toe, substantially on the left medial malleolus and a small area on the left lateral foot. We have been using PolyMem Silver. Curlex and Coban. He has home health changing the dressing. Since last time  he was here he had a culture of the left medial ankle that showed MRSA. He is completed a course/7 days of doxycycline. XRAYS of the left ankle and right foot that were ordered were negative for osteomyelitis 4/30; he arrives in clinic today with some maceration around the wound and a lot of tenderness and erythema in a circumferential area around the right lateral foot. He has wound areas on the right lateral foot dorsal right second toe, largely over the left medial malleolus and a small area over the left lateral foot 5/7;  completed his antibiotics from last time right lateral foot cellulitis is better and the area on his left knee which we discovered after he had been seen by the provider also is improved. He still has extensive wound areas over the right lateral foot, left medial malleolus, right second toe and left lateral foot although all of these look somewhat better with PolyMem 5/15. Wounds on the right lateral foot, right second toe left medial malleolus and the left lateral foot. All of these look somewhat better. We have been using PolyMem Ag 5/26; the wound on the right lateral foot is almost closed over but he still complains of a lot of pain in this area. He has a eschared area on the right second toe but no open wound.Geoffry [place]. ooAlso concerning today his original wound on the left medial malleolus looks healthy however just inferior to this almost areas of deep tissue injury and there is an area on the left lateral foot also looks like a deep tissue injury. In the past I have wondered whether he might have cholesterol emboli or some other form of a vasculopathy. I wonder whether this could have something to do with this. 6/2; right lateral foot wound is still open I gave him empiric antibiotics last week without really a lot of change she is still complaining of pain in this area. We x- rayed his feet at some point in April these were negative. It would be difficult to do an MRI. The area on his left medial malleolus looks better. Right second toe is healed. He still has what looks to be a small pressure related area on the left lateral foot but it is not open. We are using PolyMem to all wound which seems to be doing nicely 6/16; right lateral foot wound is still open. The wound looks better although there is still a lot of tenderness around it. Rather than more empiric antibiotics I have cultured this today. We are using PolyMem Ag. On the left lateral malleolus the wound appears better. He has a new area on  the left lower anterior pretibial area 6/29; 2-week follow-up. Culture I did of the right lateral foot 2 weeks ago showed MRSA. I gave him 2 weeks of Septra which he should be just about finishing now and this seems better. We got a call from home health last week to report swelling in the left leg because we were listed as his primary. We referred him back to primary care. He arrives today with intense erythema and tenderness around the 2 wounds on the left medial ankle on the left lower calf. The area on the right lateral foot looks better 7/7; I brought him back this week to follow-up on his left foot which had cellulitis last week. I gave him clindamycin which as far as I know he tolerated. The erythema is better. He has a 3 current wounds one on the right lateral foot a  small area on the right lateral malleolus and the more recent area on the left anterior pretibial area. 7/21; bilateral distal lower leg and foot wounds. The area on his right lateral foot looks a lot better. He has an area on his left pretibial area distally. The area on the medial malleolus on the left which is 1 of his worst wounds has closed over. He does not appear to have any open areas in his remaining toes 8/4-Patient returns at 2 weeks, he has the remaining wound on the left lower leg the other 2 wounds have healed. Patient is continued to be treated with silver alginate with a 2 layer wrap on the left 8/18; I been following this patient for a long period for bilateral lower extremity wounds. 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 His wounds are probably pressure although he religiously wears thick bunny boots. He does not have macrovascular disease but I did formal testing on him perhaps 2 or 3 years ago. I have often wondered about either cholesterol emboli or a vasculopathy of some sort given the distal nature of these variable locations etc. He has 2 areas that we  have been following one on the left medial lower leg and one on the right lateral foot. Both of these are mostly healed today there is some eschar over sections of both areas although I did not disturb this. 03/31/2019 on evaluation today patient appears to be doing a little bit more poorly in regard to the sacral region. He has 2 small wounds noted at this point. Fortunately there is no signs of active infection at this time. No fevers, chills, nausea, vomiting, or diarrhea. He is having some pain when he sits on his gluteal region a big part of the reason he has these wounds is likely due to the fact that his wheelchair has not been functioning properly. He does still have a couple areas of deep tissue injury on his lower extremities he does need new Prevalon offloading boots as well. 04/28/2019 on evaluation today patient appears to be doing well with regard to his wounds in particular. His ankle area has fairly small based on what I am seeing currently and his sacral region actually is still open but does not appear to be doing too poorly in my opinion. Fortunately there is no signs of active infection at this time. He was in the hospital unfortunately and this was from 04/08/2019 through 04/12/2019 secondary to persistent diarrhea, dehydration and acute kidney injury. With that being said fortunately the patient does not seem to be doing too poorly at this time which is good news. He is having pain mainly in the sacral region. 06/23/2019 on evaluation today patient appears to be doing well with regard to his sacral region which in fact is completely healed. Unfortunately he has an area of rubbing/pressure on his right lateral chest/back region. This is where it is rubbing up on his chair. Subsequently he also has an open wound on the left medial malleolus which is not very open but starting to crack and then on the right lateral foot this is much more open at this point. Unfortunately there is no  signs of significant improvement in regard to these areas and in fact there does appear to be evidence of active infection quite significantly. No fevers, chills, nausea, vomiting, or diarrhea. The patient did test positive for COVID-19 that has been greater than 14 days ago although they  did not know the exact timing. That is why he was not here for his last appointment. 07/07/2019 upon evaluation today patient actually appears to be doing quite well in regard to his left ankle as well as the chest/back region which is doing great in fact appears to be healed in my opinion. He still has an issue on his lateral right foot that is open and appears to be infected but does have me more concerned. In fact I do believe the doxycycline helped in general for the cellulitis but I believe that may have been more MRSA related based on the culture that we did. What I am seeing right now has more of the blue-green drainage and may be more consistent with a Pseudomonas that I was hoping was not really causing much of an issue but I think it may be at this point on the right lateral foot. For that reason I do think treatment would be a good idea of the reason I avoided the Cipro before was the potential for QT prolongation which could obviously cause problems with the patient with results of interaction with respiratory wound. The tizanidine also had some interactions as well. Nonetheless I think at this point that it would be a possibility for [place] to use topical gentamicin to see if this could be of benefit for the patient underneath the silver alginate dressing. 07/21/2019 upon evaluation today patient appears to be doing fairly well at this point in regard to his lower extremity on the right. He does have a wound on his right lateral foot as well as the right fifth toe and the right fourth toe. Unfortunately he continues to experience significant issues with pressure and trauma to some degree and I think this is  just due to the fact that again he is not really not able to control his legs and what is happening here. Fortunately there is no signs of active infection at this time. 08/04/2019 upon evaluation today patient actually appears to be doing quite well with regard to his toe ulcers I am very pleased in that regard. Unfortunately the foot ulcer is showing some signs of erythema his primary care provider has placed him on clindamycin at this point. Again I do believe this can be beneficial for him. With that being said I do think the patient may benefit from a light Kerlix and Coban wrap to help with some of his edema at this point. 08/18/2019 upon evaluation today patient appears to be doing still poorly in regard to his lateral foot ulcer. This is not very deep but there is some concern about the possibility of osteomyelitis. I think it would be appropriate for [place] to go ahead and have an x-ray at this time. The patient is currently on clindamycin which from his culture which was performed on 06/23/2019 it does appear that he would benefit from that in regard to the MRSA. However Pseudomonas was also identified then and the patient actually has some blue-green drainage at this point as well. I am much more concerned about the possibility of the Pseudomonas being an infection is causative agent at this point that I am the MRSA. Subsequently Cipro the patient was sensitive to but we never use that is stable use a topical gent due to the fact that unfortunately there was some interaction between the Cipro and the patient's risk for down potentially causing QT prolongation. 08/31/2019; have not seen this patient in quite a period of time. He has a  substantial wound on his right lateral foot we have been using gentamicin ointment with Hydrofera Blue over the top. From what I am able to determine he is also on antibiotics perhaps clindamycin although I have not had a chance to look over this. He also has an eschared  area on the right fifth toe as well as the right second toe. 4/20; right lateral foot wound. He comes in today with intense erythema and tenderness around this wound. The wound itself has a surface on it but it doesn't look particularly healthy a lot of gelatinous debris. There is no palpable bone. We've been using gentamicin ointment and Hydrofera Blue. Since the patient was last here he was seen by Dr. Algis Liming of infectious disease. He ordered an MRI that I think is due later this month on 4/30. He apparently also checked a sedimentation rate and C-reactive protein metabolic panel CBC with differential. At the time he was seen he wanted to withhold any microbials pending identification of the depth of the infection although looking at this I don't know that I'm going to be able to hold off for another 10 days. 4/29; he went on to have the MRI that was ordered by Dr. Algis Liming of infectious disease. 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 I had given him cefdinir last week for surrounding cellulitis he is finishing this today. I have communicated with Dr. Algis Liming who wants bone for pathology and culture before proceeding with consideration of IV antibiotics. Fortunately he is due to see Dr. Samuella Cota of podiatry today I am hopeful that the bone biopsy and culture will be considered there 5/4 as I understand things Dr. Samuella Cota actually wants to remove the diseased proximal fifth metatarsal. Looking back at this decision is probably not a bad one. He has good blood flow he is nonambulatory they are apparently going to do this next week. Paradoxically the wound on the lateral part of his foot is actually a lot better. The cellulitis that was so problematic 2 weeks ago also has resolved. 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 The last time I saw him he had a diseased proximal fifth metatarsal.  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 Samuella Cota. Culture of this showed Pseudomonas and he was followed by infectious disease Dr. Algis Liming treated with ciprofloxacin. 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 He had Integra placed. A CandS showed Pseudomonas again. Since then he has had Silvadene as a primary dressing, then Santyl and more recently Xeroform and then back to Santyl 3 times a week per his attendant who comes with him. Reviewing his records I was really not prepared for the number of wounds that he currently has. This includes substantial areas on the right lateral foot, right lateral ankle, right fourth toe dorsally, right ankle anteriorly as well as the left lateral ankle and foot. His medical history is unchanged ABIs 1.2 on the right and 1.21 on the left he has not been known to have arterial insufficiency and is not a diabetic 11/5; patient I readmitted to the clinic last week. He has multiple wounds on the right lateral foot, right fourth toe and a new area on the right second toe. He has areas on the right dorsal ankle and a  substantive wound over the left lateral ankle and foot. We are using silver collagen. We put him under compression last week and this seems to have helped. He has home health coming out one other time per week to change the dressing 11/12; no real improvement. Multiple bilateral foot and ankle wounds. ooOn the left he has a substantial wound on the left lateral ankle ooOn the right he has wounds on the dorsal second and fourth toes. ooRight lateral foot we have merged to wounds these are necrotic ooShe has an area on the right dorsal ankle and a area on the right medial malleolus with marked surrounding erythema likely cellulitis. The patient complains about pain that kept him awake all last night from wraps that were  too tight. He is not known to have arterial insufficiency. 11/18; no real change. I gave him empiric Levaquin last week directed at previously cultured Pseudomonas this is not really helped. On the right he has wounds on the right lateral foot right lateral ankle right medial ankle and the dorsal second and fourth toes. Most of these do not have a nice have a healthy surface. There is erythema in his foot which may all be venous stasis. The area on the left lateral malleolus/ankle area looks better 12/3; really disappointing if anything deterioration especially on the right lateral foot. These are necrotic. He also has areas on the right dorsal ankle right medial malleolus and the right second and fourth toes. On the left he has a large area over the left medial ankle. There is some erythema around the wound. Things are very tender I have reviewed his most recent arterial Dopplers which are actually in 2019 but he has never been felt to have arterial issues. At that time his ABI on the right was 1.16 [no TBI due to amputation] on the left his ABI was 1.20 with a great toe pressure of 0.24. Waveforms on the right and left toes were all normal he was not felt to have significant evidence of lower extremity arterial disease. ABIs in our clinic have always been normal his pulses have been palpable. He had an MRI of his right foot in April 2021 this showed a soft tissue ulceration along the lateral midfoot with underlying osteomyelitis no abscess. 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 He returned to our clinic on 03/23/2020. The same pattern of wounds that I am describing currently this was a lot worse than earlier in the year. I gave him empiric antibiotics because he had had Pseudomonas in the past this does not seem to have helped. We have not made any progress on any of these wound site. The patient is in a lot of pain 12/10; the patient comes in with a new deep tissue injury on the right  heel large necrotic wound on the right lateral foot, smaller necrotic wound on the dorsal ankle and medial ankle. Necrotic wounds on the second and fourth toes. We have been using Hydrofera Blue On the left he has the area on the left little medial ankle which actually looks some better healthier looking tissue. We are using Hydrofera Blue here as well X-ray ordered last week showed no osseous erosion no plain film indication of osteomyelitis. 12/17; PCR culture I did of the right lateral foot wound last week showed a large titer of Enterococcus faecalis and low titers of of coag negative staph and Peptostreptococcus. I have started him on Augmentin for 2 weeks but I may  continue beyond this. He has continuous complaints of pain now apparently making it difficult for him to rest at night. Substantial wound area in the right foot and ankle. Unfortunately the left medial foot seems to be more substantial this week. Objective Constitutional Sitting or standing Blood Pressure is within target range for patient.. Pulse regular and within target range for patient.Marland Kitchen Respirations regular, non-labored and within target range.. Temperature is normal and within the target range for the patient.Marland Kitchen Appears in no distress. Vitals Time Taken: 8:58 AM, Temperature: 98.5 F, Pulse: 88 bpm, Respiratory Rate: 17 breaths/min, Blood Pressure: 117/77 mmHg. Cardiovascular Dorsalis pedis pulses are palpable. General Notes: Wound exam ooOn the left the wound medially is more extensive there is been extension superiorly. I am really not sure why this is. ooOn the right all the wounds appear to have less inflammation suggesting that the Augmentin is been effective. The area right laterally looks somewhat improved over the surface is nonviable. Right dorsal ankle nonviable surface, second and fourth toes are about the same. Medial ankle about the same. Integumentary (Hair, Skin) Wound #40 status is Open. Original cause of  wound was Gradually Appeared. The wound is located on the Left,Medial Lower Leg. The wound measures 6cm length x 4cm width x 0.1cm depth; 18.85cm^2 area and 1.885cm^3 volume. There is Fat Layer (Subcutaneous Tissue) exposed. There is no tunneling or undermining noted. There is a large amount of serosanguineous drainage noted. The wound margin is flat and intact. There is medium (34-66%) red, friable granulation within the wound bed. There is a medium (34-66%) amount of necrotic tissue within the wound bed including Adherent Slough. Wound #41 status is Open. Original cause of wound was Trauma. The wound is located on the Right T Fourth. The wound measures 1.4cm length x 1.5cm oe width x 0.2cm depth; 1.649cm^2 area and 0.33cm^3 volume. There is Fat Layer (Subcutaneous Tissue) exposed. There is no tunneling or undermining noted. There is a medium amount of serosanguineous drainage noted. The wound margin is flat and intact. There is medium (34-66%) pink granulation within the wound bed. There is a medium (34-66%) amount of necrotic tissue within the wound bed including Adherent Slough. Wound #42 status is Open. Original cause of wound was Gradually Appeared. The wound is located on the Right,Medial Malleolus. The wound measures 1.4cm length x 4cm width x 0.1cm depth; 4.398cm^2 area and 0.44cm^3 volume. There is Fat Layer (Subcutaneous Tissue) exposed. There is no tunneling or undermining noted. There is a medium amount of serosanguineous drainage noted. The wound margin is flat and intact. There is large (67-100%) pink, friable granulation within the wound bed. There is a small (1-33%) amount of necrotic tissue within the wound bed including Adherent Slough. Wound #43 status is Open. Original cause of wound was Pressure Injury. The wound is located on the Right,Distal,Anterior Lower Leg. The wound measures 2.5cm length x 0.7cm width x 0.3cm depth; 1.374cm^2 area and 0.412cm^3 volume. There is Fat Layer  (Subcutaneous Tissue) exposed. There is no tunneling or undermining noted. There is a small amount of serosanguineous drainage noted. The wound margin is flat and intact. There is small (1-33%) red granulation within the wound bed. There is a large (67-100%) amount of necrotic tissue within the wound bed including Eschar and Adherent Slough. Wound #44 status is Open. Original cause of wound was Pressure Injury. The wound is located on the Right,Proximal,Lateral Foot. The wound measures 6cm length x 3.2cm width x 0.8cm depth; 15.08cm^2 area and 12.064cm^3 volume. There  is tendon and Fat Layer (Subcutaneous Tissue) exposed. There is a large amount of purulent drainage noted. The wound margin is flat and intact. There is small (1-33%) granulation within the wound bed. There is a large (67-100%) amount of necrotic tissue within the wound bed including Eschar and Adherent Slough. Wound #46 status is Open. Original cause of wound was Gradually Appeared. The wound is located on the Right T Second. The wound measures 0.4cm oe length x 0.9cm width x 0.4cm depth; 0.283cm^2 area and 0.113cm^3 volume. There is Fat Layer (Subcutaneous Tissue) exposed. There is no tunneling or undermining noted. There is a medium amount of serosanguineous drainage noted. The wound margin is flat and intact. There is small (1-33%) pink granulation within the wound bed. There is a large (67-100%) amount of necrotic tissue within the wound bed including Eschar and Adherent Slough. 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 - Monday 12/27 Bathing/ Shower/ Hygiene: May shower with protection but do not get wound dressing(s) wet. Edema Control - Lymphedema / SCD / Other: Elevate legs to the level of the heart or above for 30 minutes daily and/or when sitting, a frequency of: Off-Loading: Heel suspension boot to: - heel protectors at all times Other: - avoid any pressure to back of heels Home Health: No change in wound care orders this week; continue Home Health for wound care. May utilize formulary equivalent dressing for wound treatment orders unless otherwise specified. Other Home Health Orders/Instructions: - [facility] ordered were: MRI, right foot and ankle with and without contrast - Non healing ulcers on right foot, rule out Osteomyelitis WOUND #40: - Lower Leg Wound Laterality: Left, Medial Cleanser: Wound Cleanser (Home Health) 2 x Per Week/30 Days Discharge Instructions: Cleanse the wound with wound cleanser prior to applying a clean dressing using gauze sponges, not tissue or cotton balls. Peri-Wound Care: Sween Lotion (Moisturizing lotion) (Home Health) 2 x Per Week/30 Days Discharge Instructions: Apply moisturizing lotion with dressing changes Prim Dressing: Hydrofera Blue Classic Foam, 4x4 in (Home Health) 2 x Per Week/30 Days ary Discharge Instructions: Apply to wound bed as instructed Secondary Dressing: ABD Pad, 5x9 (Home Health) 2 x Per Week/30 Days Discharge Instructions: Apply over primary dressing as directed. Com pression Wrap: Kerlix Roll 4.5x3.1 (in/yd) (Home Health) 2 x Per Week/30 Days Discharge Instructions: Apply Kerlix and Coban compression , be sure to wrap from base of toes to tibial tuberosity. no too tight Com pression Wrap: Coban Self-Adherent Wrap 4x5 (in/yd) (Home Health) 2 x Per Week/30 Days Discharge Instructions: Apply over Kerlix as directed. WOUND #41: - T Fourth Wound Laterality:  Right oe Cleanser: Wound Cleanser (Home Health) 2 x Per Week/30 Days Discharge Instructions: Cleanse the wound with wound cleanser prior to applying a clean dressing using gauze sponges, not tissue or cotton balls. Peri-Wound Care: Sween Lotion (Moisturizing lotion) 2 x Per Week/30 Days Discharge Instructions: Apply moisturizing lotion with dressing changes Topical: Bactroban (Home Health) 2 x Per Week/30 Days Discharge Instructions: apply to wound bed under alginate Prim Dressing: KerraCel Ag Gelling Fiber Dressing, 4x5 in (silver alginate) (Home Health) 2 x Per Week/30  Days ary Discharge Instructions: Apply silver alginate to wound bed as instructed Secondary Dressing: Woven Gauze Sponge, Non-Sterile 4x4 in (Home Health) 2 x Per Week/30 Days Discharge Instructions: Apply over primary dressing as directed. Secondary Dressing: ABD Pad, 5x9 (Home Health) 2 x Per Week/30 Days Discharge Instructions: Apply over primary dressing as directed. Com pression Wrap: Kerlix Roll 4.5x3.1 (in/yd) (Home Health) 2 x Per Week/30 Days Discharge Instructions: Apply Kerlix and Coban compression as directed. Com pression Wrap: Coban Self-Adherent Wrap 4x5 (in/yd) (Home Health) 2 x Per Week/30 Days Discharge Instructions: Apply over Kerlix as directed. WOUND #42: - Malleolus Wound Laterality: Right, Medial Cleanser: Wound Cleanser (Home Health) 2 x Per Week/30 Days Discharge Instructions: Cleanse the wound with wound cleanser prior to applying a clean dressing using gauze sponges, not tissue or cotton balls. Peri-Wound Care: Sween Lotion (Moisturizing lotion) 2 x Per Week/30 Days Discharge Instructions: Apply moisturizing lotion with dressing changes Topical: Bactroban (Home Health) 2 x Per Week/30 Days Discharge Instructions: apply to wound bed under alginate Prim Dressing: KerraCel Ag Gelling Fiber Dressing, 4x5 in (silver alginate) (Home Health) 2 x Per Week/30 Days ary Discharge Instructions: Apply silver  alginate to wound bed as instructed Secondary Dressing: Woven Gauze Sponge, Non-Sterile 4x4 in (Home Health) 2 x Per Week/30 Days Discharge Instructions: Apply over primary dressing as directed. Secondary Dressing: ABD Pad, 5x9 (Home Health) 2 x Per Week/30 Days Discharge Instructions: Apply over primary dressing as directed. Com pression Wrap: Kerlix Roll 4.5x3.1 (in/yd) (Home Health) 2 x Per Week/30 Days Discharge Instructions: Apply Kerlix and Coban compression as directed. Com pression Wrap: Coban Self-Adherent Wrap 4x5 (in/yd) (Home Health) 2 x Per Week/30 Days Discharge Instructions: Apply over Kerlix as directed. WOUND #43: - Lower Leg Wound Laterality: Right, Anterior, Distal Cleanser: Wound Cleanser (Home Health) 2 x Per Week/30 Days Discharge Instructions: Cleanse the wound with wound cleanser prior to applying a clean dressing using gauze sponges, not tissue or cotton balls. Peri-Wound Care: Sween Lotion (Moisturizing lotion) 2 x Per Week/30 Days Discharge Instructions: Apply moisturizing lotion with dressing changes Topical: Bactroban (Home Health) 2 x Per Week/30 Days Discharge Instructions: apply to wound bed under alginate Prim Dressing: KerraCel Ag Gelling Fiber Dressing, 4x5 in (silver alginate) (Home Health) 2 x Per Week/30 Days ary Discharge Instructions: Apply silver alginate to wound bed as instructed Secondary Dressing: Woven Gauze Sponge, Non-Sterile 4x4 in (Home Health) 2 x Per Week/30 Days Discharge Instructions: Apply over primary dressing as directed. Secondary Dressing: ABD Pad, 5x9 (Home Health) 2 x Per Week/30 Days Discharge Instructions: Apply over primary dressing as directed. Com pression Wrap: Kerlix Roll 4.5x3.1 (in/yd) (Home Health) 2 x Per Week/30 Days Discharge Instructions: Apply Kerlix and Coban compression as directed. Com pression Wrap: Coban Self-Adherent Wrap 4x5 (in/yd) (Home Health) 2 x Per Week/30 Days Discharge Instructions: Apply over Kerlix  as directed. WOUND #44: - Foot Wound Laterality: Right, Lateral, Proximal Cleanser: Wound Cleanser (Home Health) 2 x Per Week/30 Days Discharge Instructions: Cleanse the wound with wound cleanser prior to applying a clean dressing using gauze sponges, not tissue or cotton balls. Peri-Wound Care: Sween Lotion (Moisturizing lotion) 2 x Per Week/30 Days Discharge Instructions: Apply moisturizing lotion with dressing changes Topical: Bactroban (Home Health) 2 x Per Week/30 Days Discharge Instructions: apply to wound bed under alginate Prim Dressing: KerraCel Ag Gelling Fiber Dressing, 4x5 in (silver alginate) (Home Health) 2 x Per Week/30 Days ary Discharge Instructions: Apply silver alginate to wound bed as instructed Secondary Dressing: Woven Gauze  Sponge, Non-Sterile 4x4 in (Home Health) 2 x Per Week/30 Days Discharge Instructions: Apply over primary dressing as directed. Secondary Dressing: ABD Pad, 5x9 (Home Health) 2 x Per Week/30 Days Discharge Instructions: Apply over primary dressing as directed. Com pression Wrap: Kerlix Roll 4.5x3.1 (in/yd) (Home Health) 2 x Per Week/30 Days Discharge Instructions: Apply Kerlix and Coban compression as directed. Com pression Wrap: Coban Self-Adherent Wrap 4x5 (in/yd) (Home Health) 2 x Per Week/30 Days Discharge Instructions: Apply over Kerlix as directed. WOUND #46: - T Second Wound Laterality: Right oe Cleanser: Wound Cleanser (Home Health) 2 x Per Week/30 Days Discharge Instructions: Cleanse the wound with wound cleanser prior to applying a clean dressing using gauze sponges, not tissue or cotton balls. Peri-Wound Care: Sween Lotion (Moisturizing lotion) 2 x Per Week/30 Days Discharge Instructions: Apply moisturizing lotion with dressing changes Topical: Bactroban (Home Health) 2 x Per Week/30 Days Discharge Instructions: apply to wound bed under alginate Prim Dressing: KerraCel Ag Gelling Fiber Dressing, 4x5 in (silver alginate) (Home Health) 2 x  Per Week/30 Days ary Discharge Instructions: Apply silver alginate to wound bed as instructed Secondary Dressing: Woven Gauze Sponge, Non-Sterile 4x4 in (Home Health) 2 x Per Week/30 Days Discharge Instructions: Apply over primary dressing as directed. Secondary Dressing: ABD Pad, 5x9 (Home Health) 2 x Per Week/30 Days Discharge Instructions: Apply over primary dressing as directed. Com pression Wrap: Kerlix Roll 4.5x3.1 (in/yd) (Home Health) 2 x Per Week/30 Days Discharge Instructions: Apply Kerlix and Coban compression as directed. Com pression Wrap: Coban Self-Adherent Wrap 4x5 (in/yd) (Home Health) 2 x Per Week/30 Days Discharge Instructions: Apply over Kerlix as directed. 1. I continued with silver alginate on the right and Hydrofera Blue on the left. 2. I have gone ahead and ordered a repeat MRI of the right foot with contrast. If this man has osteomyelitis I think he is going to have a BKA and I have already talked to him about this he wants to be done with the pain. 3. I am going to continue the Augmentin I will see him in 10 days and I may renew this at that point Electronic Signature(s) Signed: 05/12/2020 4:58:57 PM By: Baltazar Najjar MD Entered By: Baltazar Najjar on 05/12/2020 10:07:57 -------------------------------------------------------------------------------- SuperBill Details Patient Name: Date of Service: [name] 05/12/2020 Medical Record Number: [id number] Patient Account Number: [id number] Date of Birth/Sex: Treating RN: 1960/08/02 (59 y.o. 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 left ankle with fat layer exposed L89.616 Pressure-induced deep tissue damage of right heel Facility Procedures CPT4 Code: [id number] Description: 47829 - WOUND CARE VISIT-LEV 5 EST PT Modifier: Quantity: 1 Physician Procedures : CPT4 Code Description Modifier [id number] - WC PHYS LEVEL 4 - EST PT ICD-10 Diagnosis Description L97.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 layer exposed L97.312 Non-pressure chronic ulcer of right ankle with fat layer  exposed L97.322 Non-pressure chronic ulcer of left ankle with fat layer exposed Quantity: 1 Electronic Signature(s) Signed: 05/12/2020 5:46:53 PM By: [name] RN, BSN Signed: 05/15/2020 7:23:52 PM By: Baltazar Najjar MD  Previous Signature: 05/12/2020 4:58:57 PM Version By: Baltazar Najjar MD Entered By: [name] on 05/12/2020 17:25:19

## 2020-05-16 ENCOUNTER — Other Ambulatory Visit (HOSPITAL_COMMUNITY): Payer: Self-pay | Admitting: Internal Medicine

## 2020-05-16 DIAGNOSIS — L97519 Non-pressure chronic ulcer of other part of right foot with unspecified severity: Secondary | ICD-10-CM

## 2020-05-22 ENCOUNTER — Other Ambulatory Visit: Payer: Self-pay

## 2020-05-22 ENCOUNTER — Encounter (HOSPITAL_BASED_OUTPATIENT_CLINIC_OR_DEPARTMENT_OTHER): Payer: Medicare Other | Admitting: Internal Medicine

## 2020-05-22 DIAGNOSIS — T8131XA Disruption of external operation (surgical) wound, not elsewhere classified, initial encounter: Secondary | ICD-10-CM | POA: Diagnosis not present

## 2020-05-23 NOTE — Progress Notes (Signed)
Shawn Stall on 05/22/2020 09:49:40 -------------------------------------------------------------------------------- Patient/Caregiver Education Details Patient Name: Date of Service: [name], [name] 12/27/2021andnbsp9:45 A M Medical Record Number: [id number] Patient Account Number: [id number] Date of Birth/Gender: Treating RN: [birth year] (59 y.o. [name] Primary Care Physician: [name] Other Clinician: Referring Physician: Treating Physician/Extender: Lamarr Lulas in Treatment: 8 Education Assessment Education Provided To: Patient Education Topics Provided Pressure: Methods: Explain/Verbal Responses: Reinforcements needed, State content correctly Wound/Skin Impairment: Methods: Explain/Verbal Responses: Reinforcements needed, State content correctly Electronic Signature(s) Signed: 05/22/2020 5:55:15 PM By: Zenaida Deed RN, BSN Entered By: Zenaida Deed on 05/22/2020 09:47:36 -------------------------------------------------------------------------------- Wound Assessment Details Patient Name: Date of Service: [name], [name] 05/22/2020 9:45 A M Medical Record Number: [id number] Patient Account Number: [id number] Date of Birth/Sex: Treating RN: 1960-11-30 (59 y.o. [name] Primary Care Tamya Denardo: [name] Other Clinician: Referring Akeia Perot: Treating Ladarrious Kirksey/Extender: [name], [name] in Treatment: 8 Wound Status Wound Number: 40 Primary Venous Leg Ulcer Etiology: Wound Location: Left, Medial Lower Leg Wound Open Wounding Event: Gradually Appeared Status: Date Acquired: 02/21/2020 Comorbid Anemia, [name] Vein Thrombosis, Colitis, Osteomyelitis, Weeks Of Treatment: 8 History: Neuropathy, Quadriplegia, Confinement Anxiety Clustered Wound: No Wound  Measurements Length: (cm) 1.3 Width: (cm) 1.7 Depth: (cm) 0.1 Area: (cm) 1.736 Volume: (cm) 0.174 % Reduction in Area: 91.9% % Reduction in Volume: 91.9% Epithelialization: Large (67-100%) Tunneling: No Undermining: No Wound Description Classification: Full Thickness Without Exposed Support Structures Wound Margin: Flat and Intact Exudate Amount: Medium Exudate Type: Serosanguineous Exudate Color: red, brown Foul Odor After Cleansing: No Slough/Fibrino Yes Wound Bed Granulation Amount: Large (67-100%) Exposed Structure Granulation Quality: Red, Friable Fascia Exposed: No Necrotic Amount: None Present (0%) Fat Layer (Subcutaneous Tissue) Exposed: Yes Tendon Exposed: No Muscle Exposed: No Joint Exposed: No Bone Exposed: No Electronic Signature(s) Signed: 05/22/2020 5:55:20 PM By: Shawn Stall Entered By: Shawn Stall on 05/22/2020 09:54:39 -------------------------------------------------------------------------------- Wound Assessment Details Patient Name: Date of Service: [name], [name] 05/22/2020 9:45 A M Medical Record Number: [id number] Patient Account Number: [id number] Date of Birth/Sex: Treating RN: 07-Apr-1961 (59 y.o. [name] Primary Care Alayne Estrella: [name] Other Clinician: Referring Garnetta Fedrick: Treating Azarie Coriz/Extender: [name], [name] in Treatment: 8 Wound Status Wound Number: 41 Primary Lymphedema Etiology: Wound Location: Right T Fourth oe Wound Open Wounding Event: Trauma Status: Date Acquired: 02/21/2020 Comorbid Anemia, [name] Vein Thrombosis, Colitis, Osteomyelitis, Weeks Of Treatment: 8 History: Neuropathy, Quadriplegia, Confinement Anxiety Clustered Wound: No Wound Measurements Length: (cm) 1.2 Width: (cm) 1 Depth: (cm) 0.1 Area: (cm) 0.942 Volume: (cm) 0.094 % Reduction in Area: 33.4% % Reduction in Volume: 33.3% Epithelialization: None Tunneling: No Undermining: No Wound  Description Classification: Full Thickness Without Exposed Support Structures Wound Margin: Flat and Intact Exudate Amount: Medium Exudate Type: Serosanguineous Exudate Color: red, brown Foul Odor After Cleansing: No Slough/Fibrino Yes Wound Bed Granulation Amount: Large (67-100%) Exposed Structure Granulation Quality: Pink Fascia Exposed: No Necrotic Amount: Small (1-33%) Fat Layer (Subcutaneous Tissue) Exposed: Yes Necrotic Quality: Adherent Slough Tendon Exposed: No Muscle Exposed: No Joint Exposed: No Bone Exposed: No Electronic Signature(s) Signed: 05/22/2020 5:55:20 PM By: Shawn Stall Entered By: Shawn Stall on 05/22/2020 09:50:59 -------------------------------------------------------------------------------- Wound Assessment Details Patient Name: Date of Service: [name], [name] 05/22/2020 9:45 A M Medical Record Number: [id number] Patient Account Number: [id number] Date of Birth/Sex: Treating RN: [birth year] (59 y.o. [name] Primary Care Teresea Donley: [name] Other Clinician: Referring Haim Hansson: Treating Muadh Creasy/Extender: [name], [name] in Treatment: 8 Wound Status Wound Number: 42 Primary  D (cm) 7.697 N/A N/A A (cm) : rea 0.77 N/A N/A Volume (cm) : N/A N/A N/A % Reduction in Area: N/A N/A N/A % Reduction in Volume: Full Thickness Without Exposed N/A N/A Classification: Support Structures Medium N/A N/A Exudate Amount: Serosanguineous N/A N/A Exudate Type: red, brown N/A N/A Exudate Color: Distinct, outline attached N/A N/A Wound Margin: Large  (67-100%) N/A N/A Granulation Amount: Red, Pink N/A N/A Granulation Quality: Small (1-33%) N/A N/A Necrotic Amount: Fat Layer (Subcutaneous Tissue): Yes N/A N/A Exposed Structures: Fascia: No Tendon: No Muscle: No Joint: No Bone: No Small (1-33%) N/A N/A Epithelialization: N/A N/A N/A Debridement: N/A N/A N/A Pain Control: N/A N/A N/A Tissue Debrided: N/A N/A N/A Level: N/A N/A N/A Debridement A (sq cm): rea N/A N/A N/A Instrument: N/A N/A N/A Bleeding: N/A N/A N/A Hemostasis A chieved: N/A N/A N/A Procedural Pain: N/A N/A N/A Post Procedural Pain: Debridement Treatment Response: N/A N/A N/A Post Debridement Measurements L x N/A N/A N/A W x D (cm) N/A N/A N/A Post Debridement Volume: (cm) N/A N/A N/A Post Debridement Stage: N/A N/A N/A Procedures Performed: Treatment Notes Electronic Signature(s) Signed: 05/22/2020 5:55:20 PM By: Shawn Stall Signed: 05/23/2020 2:27:07 PM By: Baltazar Najjar MD Entered By: Baltazar Najjar on 05/22/2020 10:07:56 -------------------------------------------------------------------------------- Multi-Disciplinary Care Plan Details Patient Name: Date of Service: [name], [name] 05/22/2020 9:45 A M Medical Record Number: [id number] Patient Account Number: [id number] Date of Birth/Sex: Treating RN: Feb 12, 1961 (59 y.o. [name] Primary Care Arleth Mccullar: [name] Other Clinician: Referring Shaneya Taketa: Treating Gilma Bessette/Extender: [name], [name] in Treatment: 8 Active Inactive Wound/Skin Impairment Nursing Diagnoses: Knowledge deficit related to ulceration/compromised skin integrity Goals: Patient/caregiver will verbalize understanding of skin care regimen Date Initiated: 03/23/2020 Target Resolution Date: 06/22/2020 Goal Status: Active Ulcer/skin breakdown will have a volume reduction of 30% by week 4 Date Initiated: 03/31/2020 Date Inactivated: 04/28/2020 Target Resolution Date:  04/21/2020 Goal Status: Unmet Unmet Reason: infection, Ulcer/skin breakdown will have a volume reduction of 50% by week 8 Date Initiated: 04/28/2020 Date Inactivated: 05/22/2020 Target Resolution Date: 05/19/2020 Goal Status: Unmet Unmet Reason: eschar, infection Ulcer/skin breakdown will have a volume reduction of 80% by week 12 Date Initiated: 05/22/2020 Target Resolution Date: 06/16/2020 Goal Status: Active Interventions: Assess patient/caregiver ability to perform ulcer/skin care regimen upon admission and as needed Provide education on ulcer and skin care Treatment Activities: Skin care regimen initiated : 03/23/2020 Topical wound management initiated : 03/23/2020 Notes: Electronic Signature(s) Signed: 05/22/2020 5:55:15 PM By: Zenaida Deed RN, BSN Entered By: Zenaida Deed on 05/22/2020 09:47:10 -------------------------------------------------------------------------------- Pain Assessment Details Patient Name: Date of Service: [name], [name] 05/22/2020 9:45 A M Medical Record Number: [id number] Patient Account Number: [id number] Date of Birth/Sex: Treating RN: Jun 22, 1960 (59 y.o. [name] Primary Care Keilon Ressel: [name] Other Clinician: Referring Jahbari Repinski: Treating Rosely Fernandez/Extender: [name], [name] in Treatment: 8 Active Problems Location of Pain Severity and Description of Pain Patient Has Paino No Site Locations Rate the pain. Current Pain Level: 0 Pain Management and Medication Current Pain Management: Medication: No Cold Application: No Rest: No Massage: No Activity: No T.E.N.S.: No Heat Application: No Leg drop or elevation: No Is the Current Pain Management Adequate: Adequate How does your wound impact your activities of daily livingo Sleep: No Bathing: No Appetite: No Relationship With Others: No Bladder Continence: No Emotions: No Bowel Continence: No Work: No Toileting: No Drive:  No Dressing: No Hobbies: No Electronic Signature(s) Signed: 05/22/2020 5:55:20 PM By: Shawn Stall Entered By:  MAXTON, NOREEN ([id number]) Visit Report for 05/22/2020 Arrival Information Details Patient Name: Date of Service: [name], [name] 05/22/2020 9:45 A M Medical Record Number: [id number] Patient Account Number: [id number] Date of Birth/Sex: Treating RN: [birth year] (59 y.o. Harlon Flor, Millard.Loa Primary Care Demri Poulton: [name] Other Clinician: Referring Quetzalli Clos: Treating Jessye Imhoff/Extender: [name], [name] in Treatment: 8 Visit Information History Since Last Visit Added or deleted any medications: No Patient Arrived: Wheel Chair Any new allergies or adverse reactions: No Arrival Time: 09:40 Had a fall or experienced change in No Accompanied By: caregiver activities of daily living that may affect Transfer Assistance: None risk of falls: Patient Identification Verified: Yes Signs or symptoms of abuse/neglect since last visito No Secondary Verification Process Completed: Yes Hospitalized since last visit: No Patient Requires Transmission-Based Precautions: No Implantable device outside of the clinic excluding No Patient Has Alerts: No cellular tissue based products placed in the center since last visit: Has Dressing in Place as Prescribed: Yes Has Compression in Place as Prescribed: Yes Pain Present Now: No Electronic Signature(s) Signed: 05/22/2020 5:55:20 PM By: Shawn Stall Entered By: Shawn Stall on 05/22/2020 09:49:15 -------------------------------------------------------------------------------- Lower Extremity Assessment Details Patient Name: Date of Service: [name], [name] 05/22/2020 9:45 A M Medical Record Number: [id number] Patient Account Number: [id number] Date of Birth/Sex: Treating RN: 24-Mar-1961 (59 y.o. [name] Primary Care Kenzy Campoverde: [name] Other Clinician: Referring Vernel Langenderfer: Treating Lainy Wrobleski/Extender: [name], [name] in Treatment: 8 Edema Assessment Assessed: [Left: Yes] [Right:  Yes] Edema: [Left: Yes] [Right: Yes] Calf Left: Right: Point of Measurement: From Medial Instep 27.5 cm 29 cm Ankle Left: Right: Point of Measurement: From Medial Instep 17 cm 15.5 cm Vascular Assessment Pulses: Dorsalis Pedis Palpable: [Left:Yes] [Right:Yes] Electronic Signature(s) Signed: 05/22/2020 5:55:20 PM By: Shawn Stall Entered By: Shawn Stall on 05/22/2020 09:50:07 -------------------------------------------------------------------------------- Multi Wound Chart Details Patient Name: Date of Service: [name] 05/22/2020 9:45 A M Medical Record Number: [id number] Patient Account Number: [id number] Date of Birth/Sex: Treating RN: December 21, 1960 (59 y.o. Harlon Flor, Millard.Loa Primary Care Kenetha Cozza: [name] Other Clinician: Referring Devantae Babe: Treating Krysten Veronica/Extender: [name], [name] in Treatment: 8 Vital Signs Height(in): Pulse(bpm): 93 Weight(lbs): Blood Pressure(mmHg): 113/75 Body Mass Index(BMI): Temperature(F): 98 Respiratory Rate(breaths/min): 16 Photos: [40:No Photos Left, Medial Lower Leg] [41:No Photos Right T Fourth oe] [42:No Photos Right, Medial Malleolus] Wound Location: [40:Gradually Appeared] [41:Trauma] [42:Gradually Appeared] Wounding Event: [40:Venous Leg Ulcer] [41:Lymphedema] [42:Venous Leg Ulcer] Primary Etiology: [40:N/A] [41:N/A] [42:N/A] Secondary Etiology: [40:Anemia, [name] Vein Thrombosis,] [41:Anemia, [name] Vein Thrombosis,] [42:Anemia, [name] Vein Thrombosis,] Comorbid History: [40:Colitis, Osteomyelitis, Neuropathy, Colitis, Osteomyelitis, Neuropathy, Colitis, Osteomyelitis, Neuropathy, Quadriplegia, Confinement Anxiety 02/21/2020] [41:Quadriplegia, Confinement Anxiety 02/21/2020] [42:Quadriplegia, Confinement  Anxiety 02/21/2020] Date Acquired: [40:8] [41:8] [42:8] Weeks of Treatment: [40:Open] [41:Open] [42:Open] Wound Status: [40:No] [41:No] [42:Yes] Clustered Wound: [40:N/A] [41:N/A]  [42:2] Clustered Quantity: [40:1.3x1.7x0.1] [41:1.2x1x0.1] [42:2.9x2.7x0.1] Measurements L x W x D (cm) [40:1.736] [41:0.942] [42:6.15] A (cm) : rea [40:0.174] [41:0.094] [42:0.615] Volume (cm) : [40:91.90%] [41:33.40%] [42:-22.30%] % Reduction in A [40:rea: 91.90%] [41:33.30%] [42:38.80%] % Reduction in Volume: [40:Full Thickness Without Exposed] [41:Full Thickness Without Exposed] [42:Full Thickness Without Exposed] Classification: [40:Support Structures Medium] [41:Support Structures Medium] [42:Support Structures Medium] Exudate A mount: [40:Serosanguineous] [41:Serosanguineous] [42:Serosanguineous] Exudate Type: [40:red, brown] [41:red, brown] [42:red, brown] Exudate Color: [40:Flat and Intact] [41:Flat and Intact] [42:Flat and Intact] Wound Margin: [40:Large (67-100%)] [41:Large (67-100%)] [42:Large (67-100%)] Granulation A mount: [40:Red, Friable] [41:Pink] [42:Pink, Friable] Granulation Quality: [40:None Present (0%)] [41:Small (1-33%)] [42:Small (1-33%)] Necrotic A mount: [  D (cm) 7.697 N/A N/A A (cm) : rea 0.77 N/A N/A Volume (cm) : N/A N/A N/A % Reduction in Area: N/A N/A N/A % Reduction in Volume: Full Thickness Without Exposed N/A N/A Classification: Support Structures Medium N/A N/A Exudate Amount: Serosanguineous N/A N/A Exudate Type: red, brown N/A N/A Exudate Color: Distinct, outline attached N/A N/A Wound Margin: Large  (67-100%) N/A N/A Granulation Amount: Red, Pink N/A N/A Granulation Quality: Small (1-33%) N/A N/A Necrotic Amount: Fat Layer (Subcutaneous Tissue): Yes N/A N/A Exposed Structures: Fascia: No Tendon: No Muscle: No Joint: No Bone: No Small (1-33%) N/A N/A Epithelialization: N/A N/A N/A Debridement: N/A N/A N/A Pain Control: N/A N/A N/A Tissue Debrided: N/A N/A N/A Level: N/A N/A N/A Debridement A (sq cm): rea N/A N/A N/A Instrument: N/A N/A N/A Bleeding: N/A N/A N/A Hemostasis A chieved: N/A N/A N/A Procedural Pain: N/A N/A N/A Post Procedural Pain: Debridement Treatment Response: N/A N/A N/A Post Debridement Measurements L x N/A N/A N/A W x D (cm) N/A N/A N/A Post Debridement Volume: (cm) N/A N/A N/A Post Debridement Stage: N/A N/A N/A Procedures Performed: Treatment Notes Electronic Signature(s) Signed: 05/22/2020 5:55:20 PM By: Shawn Stall Signed: 05/23/2020 2:27:07 PM By: Baltazar Najjar MD Entered By: Baltazar Najjar on 05/22/2020 10:07:56 -------------------------------------------------------------------------------- Multi-Disciplinary Care Plan Details Patient Name: Date of Service: [name], [name] 05/22/2020 9:45 A M Medical Record Number: [id number] Patient Account Number: [id number] Date of Birth/Sex: Treating RN: Feb 12, 1961 (59 y.o. [name] Primary Care Arleth Mccullar: [name] Other Clinician: Referring Shaneya Taketa: Treating Gilma Bessette/Extender: [name], [name] in Treatment: 8 Active Inactive Wound/Skin Impairment Nursing Diagnoses: Knowledge deficit related to ulceration/compromised skin integrity Goals: Patient/caregiver will verbalize understanding of skin care regimen Date Initiated: 03/23/2020 Target Resolution Date: 06/22/2020 Goal Status: Active Ulcer/skin breakdown will have a volume reduction of 30% by week 4 Date Initiated: 03/31/2020 Date Inactivated: 04/28/2020 Target Resolution Date:  04/21/2020 Goal Status: Unmet Unmet Reason: infection, Ulcer/skin breakdown will have a volume reduction of 50% by week 8 Date Initiated: 04/28/2020 Date Inactivated: 05/22/2020 Target Resolution Date: 05/19/2020 Goal Status: Unmet Unmet Reason: eschar, infection Ulcer/skin breakdown will have a volume reduction of 80% by week 12 Date Initiated: 05/22/2020 Target Resolution Date: 06/16/2020 Goal Status: Active Interventions: Assess patient/caregiver ability to perform ulcer/skin care regimen upon admission and as needed Provide education on ulcer and skin care Treatment Activities: Skin care regimen initiated : 03/23/2020 Topical wound management initiated : 03/23/2020 Notes: Electronic Signature(s) Signed: 05/22/2020 5:55:15 PM By: Zenaida Deed RN, BSN Entered By: Zenaida Deed on 05/22/2020 09:47:10 -------------------------------------------------------------------------------- Pain Assessment Details Patient Name: Date of Service: [name], [name] 05/22/2020 9:45 A M Medical Record Number: [id number] Patient Account Number: [id number] Date of Birth/Sex: Treating RN: Jun 22, 1960 (59 y.o. [name] Primary Care Keilon Ressel: [name] Other Clinician: Referring Jahbari Repinski: Treating Rosely Fernandez/Extender: [name], [name] in Treatment: 8 Active Problems Location of Pain Severity and Description of Pain Patient Has Paino No Site Locations Rate the pain. Current Pain Level: 0 Pain Management and Medication Current Pain Management: Medication: No Cold Application: No Rest: No Massage: No Activity: No T.E.N.S.: No Heat Application: No Leg drop or elevation: No Is the Current Pain Management Adequate: Adequate How does your wound impact your activities of daily livingo Sleep: No Bathing: No Appetite: No Relationship With Others: No Bladder Continence: No Emotions: No Bowel Continence: No Work: No Toileting: No Drive:  No Dressing: No Hobbies: No Electronic Signature(s) Signed: 05/22/2020 5:55:20 PM By: Shawn Stall Entered By:  D (cm) 7.697 N/A N/A A (cm) : rea 0.77 N/A N/A Volume (cm) : N/A N/A N/A % Reduction in Area: N/A N/A N/A % Reduction in Volume: Full Thickness Without Exposed N/A N/A Classification: Support Structures Medium N/A N/A Exudate Amount: Serosanguineous N/A N/A Exudate Type: red, brown N/A N/A Exudate Color: Distinct, outline attached N/A N/A Wound Margin: Large  (67-100%) N/A N/A Granulation Amount: Red, Pink N/A N/A Granulation Quality: Small (1-33%) N/A N/A Necrotic Amount: Fat Layer (Subcutaneous Tissue): Yes N/A N/A Exposed Structures: Fascia: No Tendon: No Muscle: No Joint: No Bone: No Small (1-33%) N/A N/A Epithelialization: N/A N/A N/A Debridement: N/A N/A N/A Pain Control: N/A N/A N/A Tissue Debrided: N/A N/A N/A Level: N/A N/A N/A Debridement A (sq cm): rea N/A N/A N/A Instrument: N/A N/A N/A Bleeding: N/A N/A N/A Hemostasis A chieved: N/A N/A N/A Procedural Pain: N/A N/A N/A Post Procedural Pain: Debridement Treatment Response: N/A N/A N/A Post Debridement Measurements L x N/A N/A N/A W x D (cm) N/A N/A N/A Post Debridement Volume: (cm) N/A N/A N/A Post Debridement Stage: N/A N/A N/A Procedures Performed: Treatment Notes Electronic Signature(s) Signed: 05/22/2020 5:55:20 PM By: Shawn Stall Signed: 05/23/2020 2:27:07 PM By: Baltazar Najjar MD Entered By: Baltazar Najjar on 05/22/2020 10:07:56 -------------------------------------------------------------------------------- Multi-Disciplinary Care Plan Details Patient Name: Date of Service: [name], [name] 05/22/2020 9:45 A M Medical Record Number: [id number] Patient Account Number: [id number] Date of Birth/Sex: Treating RN: Feb 12, 1961 (59 y.o. [name] Primary Care Arleth Mccullar: [name] Other Clinician: Referring Shaneya Taketa: Treating Gilma Bessette/Extender: [name], [name] in Treatment: 8 Active Inactive Wound/Skin Impairment Nursing Diagnoses: Knowledge deficit related to ulceration/compromised skin integrity Goals: Patient/caregiver will verbalize understanding of skin care regimen Date Initiated: 03/23/2020 Target Resolution Date: 06/22/2020 Goal Status: Active Ulcer/skin breakdown will have a volume reduction of 30% by week 4 Date Initiated: 03/31/2020 Date Inactivated: 04/28/2020 Target Resolution Date:  04/21/2020 Goal Status: Unmet Unmet Reason: infection, Ulcer/skin breakdown will have a volume reduction of 50% by week 8 Date Initiated: 04/28/2020 Date Inactivated: 05/22/2020 Target Resolution Date: 05/19/2020 Goal Status: Unmet Unmet Reason: eschar, infection Ulcer/skin breakdown will have a volume reduction of 80% by week 12 Date Initiated: 05/22/2020 Target Resolution Date: 06/16/2020 Goal Status: Active Interventions: Assess patient/caregiver ability to perform ulcer/skin care regimen upon admission and as needed Provide education on ulcer and skin care Treatment Activities: Skin care regimen initiated : 03/23/2020 Topical wound management initiated : 03/23/2020 Notes: Electronic Signature(s) Signed: 05/22/2020 5:55:15 PM By: Zenaida Deed RN, BSN Entered By: Zenaida Deed on 05/22/2020 09:47:10 -------------------------------------------------------------------------------- Pain Assessment Details Patient Name: Date of Service: [name], [name] 05/22/2020 9:45 A M Medical Record Number: [id number] Patient Account Number: [id number] Date of Birth/Sex: Treating RN: Jun 22, 1960 (59 y.o. [name] Primary Care Keilon Ressel: [name] Other Clinician: Referring Jahbari Repinski: Treating Rosely Fernandez/Extender: [name], [name] in Treatment: 8 Active Problems Location of Pain Severity and Description of Pain Patient Has Paino No Site Locations Rate the pain. Current Pain Level: 0 Pain Management and Medication Current Pain Management: Medication: No Cold Application: No Rest: No Massage: No Activity: No T.E.N.S.: No Heat Application: No Leg drop or elevation: No Is the Current Pain Management Adequate: Adequate How does your wound impact your activities of daily livingo Sleep: No Bathing: No Appetite: No Relationship With Others: No Bladder Continence: No Emotions: No Bowel Continence: No Work: No Toileting: No Drive:  No Dressing: No Hobbies: No Electronic Signature(s) Signed: 05/22/2020 5:55:20 PM By: Shawn Stall Entered By:  Shawn Stall on 05/22/2020 09:49:40 -------------------------------------------------------------------------------- Patient/Caregiver Education Details Patient Name: Date of Service: [name], [name] 12/27/2021andnbsp9:45 A M Medical Record Number: [id number] Patient Account Number: [id number] Date of Birth/Gender: Treating RN: [birth year] (59 y.o. [name] Primary Care Physician: [name] Other Clinician: Referring Physician: Treating Physician/Extender: Lamarr Lulas in Treatment: 8 Education Assessment Education Provided To: Patient Education Topics Provided Pressure: Methods: Explain/Verbal Responses: Reinforcements needed, State content correctly Wound/Skin Impairment: Methods: Explain/Verbal Responses: Reinforcements needed, State content correctly Electronic Signature(s) Signed: 05/22/2020 5:55:15 PM By: Zenaida Deed RN, BSN Entered By: Zenaida Deed on 05/22/2020 09:47:36 -------------------------------------------------------------------------------- Wound Assessment Details Patient Name: Date of Service: [name], [name] 05/22/2020 9:45 A M Medical Record Number: [id number] Patient Account Number: [id number] Date of Birth/Sex: Treating RN: 1960-11-30 (59 y.o. [name] Primary Care Tamya Denardo: [name] Other Clinician: Referring Akeia Perot: Treating Ladarrious Kirksey/Extender: [name], [name] in Treatment: 8 Wound Status Wound Number: 40 Primary Venous Leg Ulcer Etiology: Wound Location: Left, Medial Lower Leg Wound Open Wounding Event: Gradually Appeared Status: Date Acquired: 02/21/2020 Comorbid Anemia, [name] Vein Thrombosis, Colitis, Osteomyelitis, Weeks Of Treatment: 8 History: Neuropathy, Quadriplegia, Confinement Anxiety Clustered Wound: No Wound  Measurements Length: (cm) 1.3 Width: (cm) 1.7 Depth: (cm) 0.1 Area: (cm) 1.736 Volume: (cm) 0.174 % Reduction in Area: 91.9% % Reduction in Volume: 91.9% Epithelialization: Large (67-100%) Tunneling: No Undermining: No Wound Description Classification: Full Thickness Without Exposed Support Structures Wound Margin: Flat and Intact Exudate Amount: Medium Exudate Type: Serosanguineous Exudate Color: red, brown Foul Odor After Cleansing: No Slough/Fibrino Yes Wound Bed Granulation Amount: Large (67-100%) Exposed Structure Granulation Quality: Red, Friable Fascia Exposed: No Necrotic Amount: None Present (0%) Fat Layer (Subcutaneous Tissue) Exposed: Yes Tendon Exposed: No Muscle Exposed: No Joint Exposed: No Bone Exposed: No Electronic Signature(s) Signed: 05/22/2020 5:55:20 PM By: Shawn Stall Entered By: Shawn Stall on 05/22/2020 09:54:39 -------------------------------------------------------------------------------- Wound Assessment Details Patient Name: Date of Service: [name], [name] 05/22/2020 9:45 A M Medical Record Number: [id number] Patient Account Number: [id number] Date of Birth/Sex: Treating RN: 07-Apr-1961 (59 y.o. [name] Primary Care Alayne Estrella: [name] Other Clinician: Referring Garnetta Fedrick: Treating Azarie Coriz/Extender: [name], [name] in Treatment: 8 Wound Status Wound Number: [id number] Primary Lymphedema Etiology: Wound Location: Right T Fourth oe Wound Open Wounding Event: Trauma Status: Date Acquired: 02/21/2020 Comorbid Anemia, [name] Vein Thrombosis, Colitis, Osteomyelitis, Weeks Of Treatment: 8 History: Neuropathy, Quadriplegia, Confinement Anxiety Clustered Wound: No Wound Measurements Length: (cm) 1.2 Width: (cm) 1 Depth: (cm) 0.1 Area: (cm) 0.942 Volume: (cm) 0.094 % Reduction in Area: 33.4% % Reduction in Volume: 33.3% Epithelialization: None Tunneling: No Undermining: No Wound  Description Classification: Full Thickness Without Exposed Support Structures Wound Margin: Flat and Intact Exudate Amount: Medium Exudate Type: Serosanguineous Exudate Color: red, brown Foul Odor After Cleansing: No Slough/Fibrino Yes Wound Bed Granulation Amount: Large (67-100%) Exposed Structure Granulation Quality: Pink Fascia Exposed: No Necrotic Amount: Small (1-33%) Fat Layer (Subcutaneous Tissue) Exposed: Yes Necrotic Quality: Adherent Slough Tendon Exposed: No Muscle Exposed: No Joint Exposed: No Bone Exposed: No Electronic Signature(s) Signed: 05/22/2020 5:55:20 PM By: Shawn Stall Entered By: Shawn Stall on 05/22/2020 09:50:59 -------------------------------------------------------------------------------- Wound Assessment Details Patient Name: Date of Service: [name], [name] 05/22/2020 9:45 A M Medical Record Number: [id number] Patient Account Number: [id number] Date of Birth/Sex: Treating RN: [birth year] (59 y.o. [name] Primary Care Teresea Donley: [name] Other Clinician: Referring Haim Hansson: Treating Muadh Creasy/Extender: [name], [name] in Treatment: 8 Wound Status Wound Number: [id number] Primary

## 2020-05-23 NOTE — Progress Notes (Signed)
MAVERIC, DEBONO ([id number]) Visit Report for 05/22/2020 Debridement Details Patient Name: Date of Service: [name] 05/22/2020 9:45 A M Medical Record Number: [id number] Patient Account Number: [id number] Date of Birth/Sex: Treating RN: 05/18/1961 (59 y.o. Harlon Flor, Millard.Loa Primary Care Provider: [name] Other Clinician: Referring Provider: Treating Provider/Extender: [name], [name] in Treatment: 8 Debridement Performed for Assessment: Wound #[id number] Right,Medial Malleolus Performed By: Physician Maxwell Caul., MD Debridement Type: Debridement Severity of Tissue Pre Debridement: Fat layer exposed Level of Consciousness (Pre-procedure): Awake and Alert Pre-procedure Verification/Time Out Yes - 09:58 Taken: Start Time: 09:59 Pain Control: Other : benzocaine 20% T Area Debrided (L x W): otal 2.9 (cm) x 2.7 (cm) = 7.83 (cm) Tissue and other material debrided: Viable, Non-Viable, Slough, Subcutaneous, Skin: Dermis , Skin: Epidermis, Fibrin/Exudate, Slough Level: Skin/Subcutaneous Tissue Debridement Description: Excisional Instrument: Curette Bleeding: Minimum Hemostasis Achieved: Pressure End Time: 10:02 Procedural Pain: 0 Post Procedural Pain: 2 Response to Treatment: Procedure was tolerated well Level of Consciousness (Post- Awake and Alert procedure): Post Debridement Measurements of Total Wound Length: (cm) 2.9 Width: (cm) 2.7 Depth: (cm) 0.1 Volume: (cm) 0.615 Character of Wound/Ulcer Post Debridement: Requires Further Debridement Severity of Tissue Post Debridement: Fat layer exposed Post Procedure Diagnosis Same as Pre-procedure Electronic Signature(s) Signed: 05/22/2020 5:55:20 PM By: Shawn Stall Signed: 05/23/2020 2:27:07 PM By: Baltazar Najjar MD Entered By: Baltazar Najjar on 05/22/2020 10:08:10 -------------------------------------------------------------------------------- Debridement Details Patient Name: Date  of Service: [name]. 05/22/2020 9:45 A M Medical Record Number: [id number] Patient Account Number: [id number] Date of Birth/Sex: Treating RN: 22-Sep-1960 (59 y.o. [name] Primary Care Provider: [name] Other Clinician: Referring Provider: Treating Provider/Extender: [name], [name] in Treatment: 8 Debridement Performed for Assessment: Wound #[id number] Right,Proximal,Lateral Foot Performed By: Physician Maxwell Caul., MD Debridement Type: Debridement Level of Consciousness (Pre-procedure): Awake and Alert Pre-procedure Verification/Time Out Yes - 09:58 Taken: Start Time: 09:59 Pain Control: Other : benzocaine 20% T Area Debrided (L x W): otal 5.3 (cm) x 4.5 (cm) = 23.85 (cm) Tissue and other material debrided: Viable, Non-Viable, Slough, Subcutaneous, Skin: Dermis , Skin: Epidermis, Fibrin/Exudate, Slough Level: Skin/Subcutaneous Tissue Debridement Description: Excisional Instrument: Curette Bleeding: Minimum Hemostasis Achieved: Pressure End Time: 10:02 Procedural Pain: 0 Post Procedural Pain: 2 Response to Treatment: Procedure was tolerated well Level of Consciousness (Post- Awake and Alert procedure): Post Debridement Measurements of Total Wound Length: (cm) 5.3 Stage: Category/Stage IV Width: (cm) 4.5 Depth: (cm) 0.4 Volume: (cm) 7.493 Character of Wound/Ulcer Post Debridement: Requires Further Debridement Post Procedure Diagnosis Same as Pre-procedure Electronic Signature(s) Signed: 05/22/2020 5:55:20 PM By: Shawn Stall Signed: 05/23/2020 2:27:07 PM By: Baltazar Najjar MD Entered By: Baltazar Najjar on 05/22/2020 10:08:22 -------------------------------------------------------------------------------- HPI Details Patient Name: Date of Service: [name]. 05/22/2020 9:45 A M Medical Record Number: [id number] Patient Account Number: [id number] Date of Birth/Sex: Treating RN: Jun 24, 1960 (59 y.o. [name] Primary Care Provider: [name] Other Clinician: Referring Provider: Treating Provider/Extender: [name], [name] in Treatment: 8 History of Present Illness HPI Description: 11/23/15; this is a patient I remember from a previous stay in this clinic in 2012. The patient says this was for a wound in this same area as currently although I have no information to verify that. Most of the information is provided by his caregiver from the group home where he resides. Apparently he has had a wound in this area for 8 months. He also has erythema around this area and has  had multiple rounds of antibiotics apparently with some improvement but the erythema is persists. He apparently had a fracture in the ankle 2 months ago and was seen by Dr. Lajoyce Corners . He apparently had a splint applied and more recently a heel offloading boot. He had home health coming out to a week ago not clear what they are dressing this with but they apparently discharged him perhaps because of one of Dr. 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 He has a Nurse, adult for transferring at home he is nonambulatory and does not wear footwear. He is not a diabetic. Looking through [place] [facility] he appears to have fairly active primary care. The area on the ankle is variably labeled as a pressure area and/or chronic venous insufficiency. He had a recent venous ultrasound on 5/25 that did not show a DVT in the left leg however this was not a reflux study. There was no superficial DVT either. Arterial studies on 10/15/14 showed a left ABI of 1.16 Center right of 1.12 waveforms were triphasic he does not have significant arterial disease. He has had recent x-rays of the left foot and ankle. The left foot did not show any abnormality. Left ankle x-ray showed a spiral fracture of the left tibial diaphysis without evidence of osteomyelitis. 11/30/15 culture I did last week showed  pseudomonas I changed him to oral ciprofloxacin. Erythema is a lot better. 12/07/15 area around the wound shows considerable improvement of the periwound erythema. 12/14/15; the patient has a improvement of the periwound erythema which in retrospect I think with cellulitis successfully treated. We have been using Iodoflex started last week 12/25/15 wound on the left medial malleolus and dorsal left foot. Theraskin #1 applied READMISSION 02/27/16; the patient was admitted to hospital from 8/14 through 01/18/16 initially felt to have cellulitis of his left lower leg however he was noted to have significant persistent pain and swelling. A duplex ultrasound was ordered that was negative. Obie Dredge an x-ray was done that showed a spiral fracture of the left leg. With posterior displacement and angulation at the mid left tibia. Orthopedics saw the patient and splinted the leg. He was then sent to [facility] skilled facility and only return to his group home last week. He has wounds on the left medial malleolus, left dorsal foot and a worrisome area on the plantar left heel which is not really open but may represent a hematoma or a DTI 03/06/16; areas of left anterior and medial ankle both look improved. There are 3 small wounds here. He has a area over the dorsal left first metatarsal head which is not open. In meantime his left heel has opened 03/14/16 the medial left ankle wounds looks stable to improved. The area on the posterior left heel has opened up and had a very adherent eschar and slept over this 03/21/16 patient has 3 wounds on his left medial ankle is stable area on his left dorsal first metatarsal head appears to be improved. Continued problems with adherent eschar over the left heel 03/28/16; patient has 3 wounds on his left medial ankle and lower leg all of these appear to be stable. The problem continues to be the left heel and the adherent eschar. This may have been a pressure ulcer from her  brace at one point. He has a English as a second language teacher we have been using [employer] and all wound areas. Dressings are being changed by home health 04/04/16; the patient's areas on the medial  aspect of the left ankle looks superficial and appear to be gradually closing. The area on the plantar aspects/Achilles aspect of his left heel as surface slough and nonviable subcutaneous tissue requiring debridement. 04/11/16; the areas on the patient's medial left ankle appear to be superficial and appear to be closing. The area on the plantar heel on the left has surface slough and nonviable subcutaneous tissue requiring debridement. I don't think this is too much different from last week there is also undermining 04/25/16; patient is followed in his group home by [facility] home health. The area on the plantar heel is smaller but with still with some depth. We're using Iodoflex to this. The areas on the left ankle looks healthy but dry and somewhat larger. Cause of this is not really clear using Prisma 05/02/16; areas x3 all look improved expecially the areas on the medial ankle. Heel wound is smaller but still deep 05/09/16; all areas 3 look improved on the medial ankle. Heel wound is also contracting. 12/0.9/17 the areas on his left medial ankle are all closed. I think these are largely chronic venous insufficiency however there is no edema. The area on his left heel was a pressure area. READMISSION 08/02/16; this man is a patient that we have had in this clinic on several times before. I believe he has cerebral palsy is nonambulatory. He has chronic venous insufficiency with venous inflammation. He was last year in the fall of 2017 with areas on his left medial ankle left heel and left dorsal foot. He arrives today with once again a vague history. As mentioned he is nonambulatory he spends most of his day in a pressure offloading boot nevertheless they noted wounds in this area on the medial left ankle 2 weeks ago.  There is any other history here I am uncertain 08/15/16; the patient's wounds on his medial ankle actually look better however the area on the left lateral/dorsal foot is covered in black necrotic eschar. Previous formal arterial studies did not show significant arterial disease with normal ABIs and triphasic waveforms. He is immobilized and at risk for pressure although he seems aware protective boots at all times as far as I am aware. 08/22/16; in general his wounds look better. All the wounds on his medial left ankle look improved the area on his lateral foot however continues to be problematic. Patient comes in the clinic today in a new wheelchair he is quite delighted 09/05/16; a wound on the left medial Calf and left dorsal foot are healed. The area on the left lateral foot is improved however the area on the left medial malleolus is inflamed with a considerable degree of surrounding erythema. This looks like cellulitis. ABIs and clinical exam are within normal limits 09/12/16; the patient continues to have a wound on the left medial calf. I was concerned about cellulitis in this area last week and gave him a course of doxycycline. The wound looks better here. The area on the left lateral foot however is unchanged 09/20/16; the patient came in this week early after traumatizing his toe on the left foot on a table while moving around in his motorized wheelchair. 09/26/16- he is here for follow-up evaluation of his left foot and malleolus ulcers. Since his appointment he abraded his left medial hallux against a table while in his motorized wheelchair, apparently trying to pull up to the table for a meal. He continues to complain of tenderness to the left lateral foot, surrounding the current ulcer 10/07/16; left  foot and malleolus ulcers. 10/14/16; the patient is doing well. He has a wound on the left lateral, left medial malleolus and atraumatic wound on the left great toe. Although these appear to be  superficial and closing 10/31/16; the patient is doing well. The area on the left lateral foot, left medial malleolus and the left great toe or all epithelialized. He has home health 11/14/16; patient arrives today with the area on the left great toe healed. The left lateral foot however in the left medial malleolus and the ankle superficial wounds that are slightly larger. He has erythema and tenderness around the area on the left lateral foot. As well he is complaining of dysuria. His caregiver notes that when he gets like this he is more lethargic which indeed seems to be the case today 11/21/16; left great toe is still healed. His wound on the left lateral foot appears healthy and slightly smaller also the area on the left medial ankle. This had black eschar on it which I removed. The remaining wound looks satisfactory 12/12/16; patient hasn't been here in a number of weeks. The area on the left lateral foot is healed however on his medial ankle has 2 open areas one of them covered with a necrotic eschar. He also has a new wound on the right second toe which was apparently trauma induced while he was at a movie 12/27/16; the patient has wounds on his left medial mallelus and left lateral foot that are just about closed. The area on the right second toe which was trauma from last time is still open but again everything looks a lot better here. 01/30/17; patient arrives in clinic today and is really been a long time since I've seen him. The wounds are all epithelialized on the left foot this includes his left lateral foot, left medial malleolus. Apparently was in the ER for what of I think was a fungal infection on his buttock's he was prescribed Diflucan and doxycycline I think this is already getting better. READMISSION T is a now 59 year old man who has advanced cerebral palsy and functional quadriplegia. We have had him in this clinic multiple times before for wounds erry on his right ankle, left  ankle, left dorsal foot and most recently from 08/02/16 through 01/30/17 with his left medial ankle and left foot and finally right second toe. His attendant from the group home where he lives is very knowledgeable about him. She states that they noticed breakdown in his skin in his feet sometime in January although they could not get an appointment until today in this clinic. The previous wounds have always been felt to be secondary to incidental trauma/pressure areas or possibly some degree of chronic venous insufficiency. He had normal arterial studies in may of 2016. He has definite open wounds on the right lateral foot over the base of the fifth metatarsal, the right second toe DIP, the left medial malleolus. He has concerning areas over the left lateral fifth metatarsal base, retaform purpura over the medial left ankle and perhaps the medial left first toe. 07/21/17; culture we did last week from the base of the right fifth metatarsal grew group G strep. This should've been sensitive to the doxycycline I gave him. The other major wounds are on the right second toe DIP, left medial malleolus, base of the left lateral fifth metatarsal and the medial left ankle and medial left toe. 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 We have macrovascular arterial studies scheduled for Thursday  although I don't think this is what the problem is. 07/28/17; patient arrives with really no improvement. He has a wound on the right second toe, lateral right foot, right medial malleolus which was new this week, left medial malleolus, but looked like a DTI on the left great toe. His macrovascular studies are on 07/30/17 although I would be surprised if this provides all the explanation. He has small areas even on his right second and third toe which look like splinter-type hemorrhages. The question I have is where is this coming from. I'd like to see the vascular studies however before we pursue this. He has had  a DVT in the past but is no longer on anticoagulants. I'll need to review his history 08/04/17; the patient had his ARTERIAL STUDIES this showed a right ABI of 1.16 and the left ABI of 1.20. TBI on the left at 0.84. He is at a right first toe amputation. The studies were unchanged from studies in May 2016. It was not felt that he had significant lower extremity arterial disease In the meantime we are using silver alginate to the wounds on the right second toe right lateral foot left medial malleolus. I'm going to change to Silver collagen today. 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 Working him up for this would include an echocardiogram probably a CT scan of the chest and abdomen to look at the major thoracic and abdominal aortic vessels. I am not going to involve myself in this unless there is more symptoms/signs 08/18/17; the patient arrives today with wounds looking somewhat better. The remaining areas are the right second toe dorsal, right lateral foot and left medial malleolus which only has a small open area. We've been using silver collagen 09/01/17;right second toe dorsally probes to bone today. This is a deterioration. We've been using silver collagen. Only a small open area remains on the left medial malleolus. 09/09/17; right second toe appears better. There is no probing bone. He has a small area remaining on the left medial malleolus, the right lateral foot. We have been using silver collagen 10/07/17; the patient returns to clinic today after a month hiatus. He has wounds over the right second toe, right lateral foot and the left medial malleolus. We have been using silver collagen. He has home health. 10/24/17; patient has open wounds over the right second toe, now the left fourth toe and a superficial area over his left buttock. Some of the other areas  have healed including the left medial malleolus and right lateral foot. Using silver collagen on these wounds 11/07/17; the patient has closed his wounds on the right second toe and left fourth toe although they look all normal. Last time he was here he had a superficial area over the left buttock this is also closed. The right lateral foot wound I had closed out last time although it is seems to have reopened today. 628/19 the areas over the right second and left fourth toe remains closed. The right lateral foot has tightly adherent necrotic surface over. We have been using silver collagen he offloads this and a bunny boot 12/12/17; the patient has reopened the recurrent area over the right second PIP joint. 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 He does not have obvious macrovascular disease. The area over the right lateral foot  looks about the same as last time tightly adherent necrotic debris. We've been using Hydrofera Blue 12/25/17; 2 week follow-up. The area over the right second PIP is closed however the area on the right lateral foot is worse with surrounding erythema and pain compatible with cellulitis is going to need an antibiotic. AS WELL..... We have looked at his buttocks and lower back. He has 3 stage II ulcers. The exact history here is unclear however he is very disabled man with cerebral palsy. He goes to a day program from the group home in which she lives 5 days a week and I think he is on his buttock all day on these visits. Surrounding the wounds see has extensive stage I injury as well. FINALLY it looks as though he has lost weight. I'm not the only staff member that is recognized this 01/08/18 on evaluation today patient appears to be doing better in regard to his gluteal wounds. In fact everything appears to be healed back here although very dry. He has a couple open sores on the scrotal region other than this it is  really his foot that is still giving him the most trouble. He did complete the Keflex unfortunately it seems like there's still a lot of erythema in this in fact looks worse than it did previous. I'm gonna culture this today and likely start with a different antibiotic. 01/16/18; I was reassured that the wounds on his buttocks and sacrum are healed. I did not look at these the day. The culture that was done last week on 01/11/18 of the right lateral foot wounds showed Pseudomonas. Bactrim is not a good choice for this. Ciprofloxacin interacts with his muscle relaxants therefore I prescribed cefdinir 300 twice a day for 7 days. Bactrim coverage of pseudomonas is not reliable although the degree of erythema looks a lot better 01/30/18; the patient has completed his antibiotics. The area does not have infection on the right lateral foot. The wounds look a lot better. We've been using silver alginate 02/12/18; there is on the right lateral foot. Copious amounts of surface eschar. Also similarly over the PIP of the right second toe. We've been using silver alginate to date 03/02/2018; the area that remains is on the right lateral foot. The right second toe is healed. 03/16/2018; the area there is original wound on the right lateral foot. Still requiring debridement. We are using Hydrofera Blue. The right second toe is still healed/closed 03/30/2018; right lateral foot there is 2 small open areas remaining. They actually look quite healthy we have been using Hydrofera Blue the right second toe remains closed 04/13/2018; right lateral foot. 1 of the 2 small open areas from last time has closed over. The other had surface slough that I removed with a #3 curette. This looks quite healthy. The right second toe remains closed. We are using Hydrofera Blue 05/01/2018; right lateral foot very superficial areas that look like they are trying to close over. He had a new threatened area on the left medial ankle just  below the medial malleolus. I think these represent chronic venous insufficiency areas 05/14/2018; the right right lateral foot looks like it closed over as was predicted last time however he has some dark areas subcutaneously erythema superiorly and this is really very tender. This is either coexistent cellulitis or perhaps a deep tissue injury. The area on the left medial ankle is superficial but has some size. We have been using Hydrofera Blue to both areas. The patient  complains of pain in his right foot 06/09/2018; right lateral foot was closed over last time but there was coexistent cellulitis around this. I gave him Levaquin. He also has an area on the left medial malleolus. He has not been back to clinic in over 3-1/2 weeks. We are using Hydrofera Blue I think to the left medial malleolus and silver alginate to the right lateral foot 1/21; right lateral foot still non-viable debris over this area and the left medial malleolus. Both of these vigorously washed off with antiseptic gauze. We have been using silver alginate. 2/4; right lateral foot this looks somewhat better. Unfortunately the area over the left medial malleolus has deteriorated. Using silver alginate in both area 2/18; both wounds arrived today covered in a lot of necrotic debris. We have been using silver alginate. Clearly not making a lot of progress. 3/3; the patient's right second toe is reopened over the PIP. I am not sure why this is happening. He may have microvascular disease. We have checked his macrovascular flow in the past and found it to be normal. He may need an x-ray. The area on the right lateral foot requires debridement. The area on the left lateral ankle is worse. We have been using PolyMem Ag 08/26/18 on evaluation today patient appears to be doing a little worse in regard to his lower extremities based on what I'm seeing what the nursing staff is telling me. His left medial ankle appears potential to be infected  there is your thing on want surrounding the wound. His right second toe is also of concern at this point in my pinion. We have not done x-rays of these areas that may be a good idea to go ahead and proceed with at this point to ensure will not deal with anything such as osteomyelitis. 4/16; it is been a while since I saw this patient. He now has wounds on the right lateral foot, dorsal right second toe, substantially on the left medial malleolus and a small area on the left lateral foot. We have been using PolyMem Silver. Curlex and Coban. He has home health changing the dressing. Since last time he was here he had a culture of the left medial ankle that showed MRSA. He is completed a course/7 days of doxycycline. XRAYS of the left ankle and right foot that were ordered were negative for osteomyelitis 4/30; he arrives in clinic today with some maceration around the wound and a lot of tenderness and erythema in a circumferential area around the right lateral foot. He has wound areas on the right lateral foot dorsal right second toe, largely over the left medial malleolus and a small area over the left lateral foot 5/7; completed his antibiotics from last time right lateral foot cellulitis is better and the area on his left knee which we discovered after he had been seen by the provider also is improved. He still has extensive wound areas over the right lateral foot, left medial malleolus, right second toe and left lateral foot although all of these look somewhat better with PolyMem 5/15. Wounds on the right lateral foot, right second toe left medial malleolus and the left lateral foot. All of these look somewhat better. We have been using PolyMem Ag 5/26; the wound on the right lateral foot is almost closed over but he still complains of a lot of pain in this area. He has a eschared area on the right second toe but no open wound.. Also concerning today his original  wound on the left medial malleolus  looks healthy however just inferior to this almost areas of deep tissue injury and there is an area on the left lateral foot also looks like a deep tissue injury. In the past I have wondered whether he might have cholesterol emboli or some other form of a vasculopathy. I wonder whether this could have something to do with this. 6/2; right lateral foot wound is still open I gave him empiric antibiotics last week without really a lot of change she is still complaining of pain in this area. We x- rayed his feet at some point in April these were negative. It would be difficult to do an MRI. The area on his left medial malleolus looks better. Right second toe is healed. He still has what looks to be a small pressure related area on the left lateral foot but it is not open. We are using PolyMem to all wound which seems to be doing nicely 6/16; right lateral foot wound is still open. The wound looks better although there is still a lot of tenderness around it. Rather than more empiric antibiotics I have cultured this today. We are using PolyMem Ag. On the left lateral malleolus the wound appears better. He has a new area on the left lower anterior pretibial area 6/29; 2-week follow-up. Culture I did of the right lateral foot 2 weeks ago showed MRSA. I gave him 2 weeks of Septra which he should be just about finishing now and this seems better. We got a call from home health last week to report swelling in the left leg because we were listed as his primary. We referred him back to primary care. He arrives today with intense erythema and tenderness around the 2 wounds on the left medial ankle on the left lower calf. The area on the right lateral foot looks better 7/7; I brought him back this week to follow-up on his left foot which had cellulitis last week. I gave him clindamycin which as far as I know he tolerated. The erythema is better. He has a 3 current wounds one on the right lateral foot a small area on  the right lateral malleolus and the more recent area on the left anterior pretibial area. 7/21; bilateral distal lower leg and foot wounds. The area on his right lateral foot looks a lot better. He has an area on his left pretibial area distally. The area on the medial malleolus on the left which is 1 of his worst wounds has closed over. He does not appear to have any open areas in his remaining toes 8/4-Patient returns at 2 weeks, he has the remaining wound on the left lower leg the other 2 wounds have healed. Patient is continued to be treated with silver alginate with a 2 layer wrap on the left 8/18; I been following this patient for a long period for bilateral lower extremity wounds. 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 His wounds are probably pressure although he religiously wears thick bunny boots. He does not have macrovascular disease but I did formal testing on him perhaps 2 or 3 years ago. I have often wondered about either cholesterol emboli or a vasculopathy of some sort given the distal nature of these variable locations etc. He has 2 areas that we have been following one on the left medial lower leg and one on the right lateral foot. Both of these are mostly healed today there  is some eschar over sections of both areas although I did not disturb this. 03/31/2019 on evaluation today patient appears to be doing a little bit more poorly in regard to the sacral region. He has 2 small wounds noted at this point. Fortunately there is no signs of active infection at this time. No fevers, chills, nausea, vomiting, or diarrhea. He is having some pain when he sits on his gluteal region a big part of the reason he has these wounds is likely due to the fact that his wheelchair has not been functioning properly. He does still have a couple areas of deep tissue injury on his lower extremities he does need new Prevalon offloading boots as  well. 04/28/2019 on evaluation today patient appears to be doing well with regard to his wounds in particular. His ankle area has fairly small based on what I am seeing currently and his sacral region actually is still open but does not appear to be doing too poorly in my opinion. Fortunately there is no signs of active infection at this time. He was in the hospital unfortunately and this was from 04/08/2019 through 04/12/2019 secondary to persistent diarrhea, dehydration and acute kidney injury. With that being said fortunately the patient does not seem to be doing too poorly at this time which is good news. He is having pain mainly in the sacral region. 06/23/2019 on evaluation today patient appears to be doing well with regard to his sacral region which in fact is completely healed. Unfortunately he has an area of rubbing/pressure on his right lateral chest/back region. This is where it is rubbing up on his chair. Subsequently he also has an open wound on the left medial malleolus which is not very open but starting to crack and then on the right lateral foot this is much more open at this point. Unfortunately there is no signs of significant improvement in regard to these areas and in fact there does appear to be evidence of active infection quite significantly. No fevers, chills, nausea, vomiting, or diarrhea. The patient did test positive for COVID-19 that has been greater than 14 days ago although they did not know the exact timing. That is why he was not here for his last appointment. 07/07/2019 upon evaluation today patient actually appears to be doing quite well in regard to his left ankle as well as the chest/back region which is doing great in fact appears to be healed in my opinion. He still has an issue on his lateral right foot that is open and appears to be infected but does have me more concerned. In fact I do believe the doxycycline helped in general for the cellulitis but I believe that  may have been more MRSA related based on the culture that we did. What I am seeing right now has more of the blue-green drainage and may be more consistent with a Pseudomonas that I was hoping was not really causing much of an issue but I think it may be at this point on the right lateral foot. For that reason I do think treatment would be a good idea of the reason I avoided the Cipro before was the potential for QT prolongation which could obviously cause problems with the patient with results of interaction with respiratory wound. The tizanidine also had some interactions as well. Nonetheless I think at this point that it would be a possibility for [place] to use topical gentamicin to see if this could be of benefit for the  patient underneath the silver alginate dressing. 07/21/2019 upon evaluation today patient appears to be doing fairly well at this point in regard to his lower extremity on the right. He does have a wound on his right lateral foot as well as the right fifth toe and the right fourth toe. Unfortunately he continues to experience significant issues with pressure and trauma to some degree and I think this is just due to the fact that again he is not really not able to control his legs and what is happening here. Fortunately there is no signs of active infection at this time. 08/04/2019 upon evaluation today patient actually appears to be doing quite well with regard to his toe ulcers I am very pleased in that regard. Unfortunately the foot ulcer is showing some signs of erythema his primary care provider has placed him on clindamycin at this point. Again I do believe this can be beneficial for him. With that being said I do think the patient may benefit from a light Kerlix and Coban wrap to help with some of his edema at this point. 08/18/2019 upon evaluation today patient appears to be doing still poorly in regard to his lateral foot ulcer. This is not very deep but there is some concern  about the possibility of osteomyelitis. I think it would be appropriate for [place] to go ahead and have an x-ray at this time. The patient is currently on clindamycin which from his culture which was performed on 06/23/2019 it does appear that he would benefit from that in regard to the MRSA. However Pseudomonas was also identified then and the patient actually has some blue-green drainage at this point as well. I am much more concerned about the possibility of the Pseudomonas being an infection is causative agent at this point that I am the MRSA. Subsequently Cipro the patient was sensitive to but we never use that is stable use a topical gent due to the fact that unfortunately there was some interaction between the Cipro and the patient's risk for down potentially causing QT prolongation. 08/31/2019; have not seen this patient in quite a period of time. He has a substantial wound on his right lateral foot we have been using gentamicin ointment with Hydrofera Blue over the top. From what I am able to determine he is also on antibiotics perhaps clindamycin although I have not had a chance to look over this. He also has an eschared area on the right fifth toe as well as the right second toe. 4/20; right lateral foot wound. He comes in today with intense erythema and tenderness around this wound. The wound itself has a surface on it but it doesn't look particularly healthy a lot of gelatinous debris. There is no palpable bone. We've been using gentamicin ointment and Hydrofera Blue. Since the patient was last here he was seen by Dr. Algis Liming of infectious disease. He ordered an MRI that I think is due later this month on 4/30. He apparently also checked a sedimentation rate and C-reactive protein metabolic panel CBC with differential. At the time he was seen he wanted to withhold any microbials pending identification of the depth of the infection although looking at this I don't know that I'm going to be able to  hold off for another 10 days. 4/29; he went on to have the MRI that was ordered by Dr. Algis Liming of infectious disease. 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 I had given him cefdinir last  week for surrounding cellulitis he is finishing this today. I have communicated with Dr. Algis Liming who wants bone for pathology and culture before proceeding with consideration of IV antibiotics. Fortunately he is due to see Dr. Samuella Cota of podiatry today I am hopeful that the bone biopsy and culture will be considered there 5/4 as I understand things Dr. Samuella Cota actually wants to remove the diseased proximal fifth metatarsal. Looking back at this decision is probably not a bad one. He has good blood flow he is nonambulatory they are apparently going to do this next week. Paradoxically the wound on the lateral part of his foot is actually a lot better. The cellulitis that was so problematic 2 weeks ago also has resolved. 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 The last time I saw him he had a diseased proximal fifth metatarsal. 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 Samuella Cota. Culture of this showed Pseudomonas and he was followed by infectious disease Dr. Algis Liming treated with ciprofloxacin. 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 He had Integra placed. A CandS showed Pseudomonas again. Since then he has had Silvadene as a primary dressing, then Santyl and more recently Xeroform and then back to Santyl 3 times a week per his attendant who comes with him. Reviewing his records I was really not prepared for the number of wounds that he currently has. This includes substantial areas on the right lateral foot, right lateral ankle, right fourth toe dorsally, right ankle anteriorly as  well as the left lateral ankle and foot. His medical history is unchanged ABIs 1.2 on the right and 1.21 on the left he has not been known to have arterial insufficiency and is not a diabetic 11/5; patient I readmitted to the clinic last week. He has multiple wounds on the right lateral foot, right fourth toe and a new area on the right second toe. He has areas on the right dorsal ankle and a substantive wound over the left lateral ankle and foot. We are using silver collagen. We put him under compression last week and this seems to have helped. He has home health coming out one other time per week to change the dressing 11/12; no real improvement. Multiple bilateral foot and ankle wounds. On the left he has a substantial wound on the left lateral ankle On the right he has wounds on the dorsal second and fourth toes. Right lateral foot we have merged to wounds these are necrotic She has an area on the right dorsal ankle and a area on the right medial malleolus with marked surrounding erythema likely cellulitis. The patient complains about pain that kept him awake all last night from wraps that were too tight. He is not known to have arterial insufficiency. 11/18; no real change. I gave him empiric Levaquin last week directed at previously cultured Pseudomonas this is not really helped. On the right he has wounds on the right lateral foot right lateral ankle right medial ankle and the dorsal second and fourth toes. Most of these do not have a nice have a healthy surface. There is erythema in his foot which may all be venous stasis. The area on the left lateral malleolus/ankle area looks better 12/3; really disappointing if anything deterioration especially on the right lateral foot. These are  necrotic. He also has areas on the right dorsal ankle right medial malleolus and the right second and fourth toes. On the left he has a large area over the left medial ankle. There is some erythema around the  wound. Things are very tender I have reviewed his most recent arterial Dopplers which are actually in 2019 but he has never been felt to have arterial issues. At that time his ABI on the right was 1.16 [no TBI due to amputation] on the left his ABI was 1.20 with a great toe pressure of 0.24. Waveforms on the right and left toes were all normal he was not felt to have significant evidence of lower extremity arterial disease. ABIs in our clinic have always been normal his pulses have been palpable. He had an MRI of his right foot in April 2021 this showed a soft tissue ulceration along the lateral midfoot with underlying osteomyelitis no abscess. 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 He returned to our clinic on 03/23/2020. The same pattern of wounds that I am describing currently this was a lot worse than earlier in the year. I gave him empiric antibiotics because he had had Pseudomonas in the past this does not seem to have helped. We have not made any progress on any of these wound site. The patient is in a lot of pain 12/10; the patient comes in with a new deep tissue injury on the right heel large necrotic wound on the right lateral foot, smaller necrotic wound on the dorsal ankle and medial ankle. Necrotic wounds on the second and fourth toes. We have been using Hydrofera Blue On the left he has the area on the left little medial ankle which actually looks some better healthier looking tissue. We are using Hydrofera Blue here as well X-ray ordered last week showed no osseous erosion no plain film indication of osteomyelitis. 12/17; PCR culture I did of the right lateral foot wound last week showed a large titer of Enterococcus faecalis and low titers of of coag negative staph and Peptostreptococcus. I have started him on Augmentin for 2 weeks but I may continue beyond this. He has continuous complaints of pain now apparently making it difficult for him to rest at night. Substantial  wound area in the right foot and ankle. Unfortunately the left medial foot seems to be more substantial this week. 12/27; in general the wounds on the right foot and ankle and the left medial ankle look better. There is certainly less erythema in the right foot. I have him on Augmentin and I am going to extend this for another week. MRI is booked for 06/01/2020 Electronic Signature(s) Signed: 05/23/2020 2:27:07 PM By: Baltazar Najjar MD Entered By: Baltazar Najjar on 05/22/2020 10:09:57 -------------------------------------------------------------------------------- Physical Exam Details Patient Name: Date of Service: [name], [name] 05/22/2020 9:45 A M Medical Record Number: [id number] Patient Account Number: [id number] Date of Birth/Sex: Treating RN: 1960-06-24 (59 y.o. [name] Primary Care Provider: [name] Other Clinician: Referring Provider: Treating Provider/Extender: [name], [name] in Treatment: 8 Constitutional Sitting or standing Blood Pressure is within target range for patient.. Pulse regular and within target range for patient.Marland Kitchen Respirations regular, non-labored and within target range.. Temperature is normal and within the target range for the patient.Marland Kitchen Appears in no distress. Notes Wound exam; on the left the wound medially over the ankle has split into 2 wounds each of which look healthy. On the right the right medial malleolus required  debridement with a #5 curette area of necrotic debris on the surface. Right lateral foot required a more extensive debridement still necrotic material he has exposed tendon here but I did not debride the tendon. Anterior ankle looks better. Second and fourth toes looks better. Skin around these wounds has no erythema no tenderness quite a bit better Electronic Signature(s) Signed: 05/23/2020 2:27:07 PM By: Baltazar Najjar MD Entered By: Baltazar Najjar on 05/22/2020  10:12:01 -------------------------------------------------------------------------------- Physician Orders Details Patient Name: Date of Service: CANDLER, GINSBERG 05/22/2020 9:45 A M Medical Record Number: [id number] Patient Account Number: [id number] Date of Birth/Sex: Treating RN: 06/16/1960 (59 y.o. 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 Bathing/ Shower/ Hygiene May shower with protection but do not get wound dressing(s) wet. Edema Control - Lymphedema / SCD / Other Bilateral Lower Extremities Elevate legs to the level of the heart or above for 30 minutes daily and/or when sitting, a frequency of: Off-Loading Heel suspension boot to: - heel protectors at all times Other: - avoid any pressure to back of heels Home Health No change in wound care orders this week; continue Home Health for wound care. May utilize formulary equivalent dressing for wound treatment orders unless otherwise specified. Other Home Health Orders/Instructions: - [facility] Wound Treatment Wound #40 - Lower Leg Wound Laterality: Left, Medial Cleanser: Wound Cleanser (Home Health) 2 x Per Week/30 Days Discharge Instructions: Cleanse the wound with wound cleanser  prior to applying a clean dressing using gauze sponges, not tissue or cotton balls. Peri-Wound Care: Sween Lotion (Moisturizing lotion) (Home Health) 2 x Per Week/30 Days Discharge Instructions: Apply moisturizing lotion with dressing changes Prim Dressing: Hydrofera Blue Classic Foam, 4x4 in (Home Health) 2 x Per Week/30 Days ary Discharge Instructions: Apply to wound bed as instructed Secondary Dressing: ABD Pad, 5x9 (Home Health) 2 x Per Week/30 Days Discharge Instructions: Apply over primary dressing as directed. Compression Wrap: Kerlix Roll 4.5x3.1 (in/yd) (Home Health) 2 x Per Week/30 Days Discharge Instructions: Apply Kerlix and Coban compression , be sure to wrap from base of toes to tibial tuberosity. no too tight Compression Wrap: Coban Self-Adherent Wrap 4x5 (in/yd) (Home Health) 2 x Per Week/30 Days Discharge Instructions: Apply over Kerlix as directed. Wound #41 - T Fourth oe Wound Laterality: Right Cleanser: Wound Cleanser (Home Health) 2 x Per Week/30 Days Discharge Instructions: Cleanse the wound with wound cleanser prior to applying a clean dressing using gauze sponges, not tissue or cotton balls. Peri-Wound Care: Sween Lotion (Moisturizing lotion) 2 x Per Week/30 Days Discharge Instructions: Apply moisturizing lotion with dressing changes Topical: Bactroban (Home Health) 2 x Per Week/30 Days Discharge Instructions: apply to wound bed under alginate Prim Dressing: KerraCel Ag Gelling Fiber Dressing, 4x5 in (silver alginate) (Home Health) 2 x Per Week/30 Days ary Discharge Instructions: Apply silver alginate to wound bed as instructed Secondary Dressing: Woven Gauze Sponge, Non-Sterile 4x4 in (Home Health) 2 x Per Week/30 Days  Discharge Instructions: Apply over primary dressing as directed. Secondary Dressing: ABD Pad, 5x9 (Home Health) 2 x Per Week/30 Days Discharge Instructions: Apply over primary dressing as directed. Compression Wrap: Kerlix Roll 4.5x3.1 (in/yd) (Home  Health) 2 x Per Week/30 Days Discharge Instructions: Apply Kerlix and Coban compression as directed. Compression Wrap: Coban Self-Adherent Wrap 4x5 (in/yd) (Home Health) 2 x Per Week/30 Days Discharge Instructions: Apply over Kerlix as directed. Wound #42 - Malleolus Wound Laterality: Right, Medial Cleanser: Wound Cleanser (Home Health) 2 x Per Week/30 Days Discharge Instructions: Cleanse the wound with wound cleanser prior to applying a clean dressing using gauze sponges, not tissue or cotton balls. Peri-Wound Care: Sween Lotion (Moisturizing lotion) 2 x Per Week/30 Days Discharge Instructions: Apply moisturizing lotion with dressing changes Topical: Bactroban (Home Health) 2 x Per Week/30 Days Discharge Instructions: apply to wound bed under alginate Prim Dressing: KerraCel Ag Gelling Fiber Dressing, 4x5 in (silver alginate) (Home Health) 2 x Per Week/30 Days ary Discharge Instructions: Apply silver alginate to wound bed as instructed Secondary Dressing: Woven Gauze Sponge, Non-Sterile 4x4 in (Home Health) 2 x Per Week/30 Days Discharge Instructions: Apply over primary dressing as directed. Secondary Dressing: ABD Pad, 5x9 (Home Health) 2 x Per Week/30 Days Discharge Instructions: Apply over primary dressing as directed. Compression Wrap: Kerlix Roll 4.5x3.1 (in/yd) (Home Health) 2 x Per Week/30 Days Discharge Instructions: Apply Kerlix and Coban compression as directed. Compression Wrap: Coban Self-Adherent Wrap 4x5 (in/yd) (Home Health) 2 x Per Week/30 Days Discharge Instructions: Apply over Kerlix as directed. Wound #43 - Lower Leg Wound Laterality: Right, Anterior, Distal Cleanser: Wound Cleanser (Home Health) 2 x Per Week/30 Days Discharge Instructions: Cleanse the wound with wound cleanser prior to applying a clean dressing using gauze sponges, not tissue or cotton balls. Peri-Wound Care: Sween Lotion (Moisturizing lotion) 2 x Per Week/30 Days Discharge Instructions: Apply  moisturizing lotion with dressing changes Topical: Bactroban (Home Health) 2 x Per Week/30 Days Discharge Instructions: apply to wound bed under alginate Prim Dressing: KerraCel Ag Gelling Fiber Dressing, 4x5 in (silver alginate) (Home Health) 2 x Per Week/30 Days ary Discharge Instructions: Apply silver alginate to wound bed as instructed Secondary Dressing: Woven Gauze Sponge, Non-Sterile 4x4 in (Home Health) 2 x Per Week/30 Days Discharge Instructions: Apply over primary dressing as directed. Secondary Dressing: ABD Pad, 5x9 (Home Health) 2 x Per Week/30 Days Discharge Instructions: Apply over primary dressing as directed. Compression Wrap: Kerlix Roll 4.5x3.1 (in/yd) (Home Health) 2 x Per Week/30 Days Discharge Instructions: Apply Kerlix and Coban compression as directed. Compression Wrap: Coban Self-Adherent Wrap 4x5 (in/yd) (Home Health) 2 x Per Week/30 Days Discharge Instructions: Apply over Kerlix as directed. Wound #44 - Foot Wound Laterality: Right, Lateral, Proximal Cleanser: Wound Cleanser (Home Health) 2 x Per Week/30 Days Discharge Instructions: Cleanse the wound with wound cleanser prior to applying a clean dressing using gauze sponges, not tissue or cotton balls. Peri-Wound Care: Sween Lotion (Moisturizing lotion) 2 x Per Week/30 Days Discharge Instructions: Apply moisturizing lotion with dressing changes Topical: Bactroban (Home Health) 2 x Per Week/30 Days Discharge Instructions: apply to wound bed under alginate Prim Dressing: KerraCel Ag Gelling Fiber Dressing, 4x5 in (silver alginate) (Home Health) 2 x Per Week/30 Days ary Discharge Instructions: Apply silver alginate to wound bed as instructed Secondary Dressing: Woven Gauze Sponge, Non-Sterile 4x4 in (Home Health) 2 x Per Week/30 Days Discharge Instructions: Apply over primary dressing as directed. Secondary Dressing: ABD Pad, 5x9 (Home Health) 2 x Per Week/30 Days Discharge Instructions:  Apply over primary dressing  as directed. Compression Wrap: Kerlix Roll 4.5x3.1 (in/yd) (Home Health) 2 x Per Week/30 Days Discharge Instructions: Apply Kerlix and Coban compression as directed. Compression Wrap: Coban Self-Adherent Wrap 4x5 (in/yd) (Home Health) 2 x Per Week/30 Days Discharge Instructions: Apply over Kerlix as directed. Wound #46 - T Second oe Wound Laterality: Right Cleanser: Wound Cleanser (Home Health) 2 x Per Week/30 Days Discharge Instructions: Cleanse the wound with wound cleanser prior to applying a clean dressing using gauze sponges, not tissue or cotton balls. Peri-Wound Care: Sween Lotion (Moisturizing lotion) 2 x Per Week/30 Days Discharge Instructions: Apply moisturizing lotion with dressing changes Topical: Bactroban (Home Health) 2 x Per Week/30 Days Discharge Instructions: apply to wound bed under alginate Prim Dressing: KerraCel Ag Gelling Fiber Dressing, 4x5 in (silver alginate) (Home Health) 2 x Per Week/30 Days ary Discharge Instructions: Apply silver alginate to wound bed as instructed Secondary Dressing: Woven Gauze Sponge, Non-Sterile 4x4 in (Home Health) 2 x Per Week/30 Days Discharge Instructions: Apply over primary dressing as directed. Secondary Dressing: ABD Pad, 5x9 (Home Health) 2 x Per Week/30 Days Discharge Instructions: Apply over primary dressing as directed. Compression Wrap: Kerlix Roll 4.5x3.1 (in/yd) (Home Health) 2 x Per Week/30 Days Discharge Instructions: Apply Kerlix and Coban compression as directed. Compression Wrap: Coban Self-Adherent Wrap 4x5 (in/yd) (Home Health) 2 x Per Week/30 Days Discharge Instructions: Apply over Kerlix as directed. Wound #47 - Malleolus Wound Laterality: Left, Medial Cleanser: Wound Cleanser (Home Health) 2 x Per Week/30 Days Discharge Instructions: Cleanse the wound with wound cleanser prior to applying a clean dressing using gauze sponges, not tissue or cotton balls. Peri-Wound Care: Sween Lotion (Moisturizing lotion) (Home  Health) 2 x Per Week/30 Days Discharge Instructions: Apply moisturizing lotion with dressing changes Prim Dressing: Hydrofera Blue Classic Foam, 4x4 in (Home Health) 2 x Per Week/30 Days ary Discharge Instructions: Apply to wound bed as instructed Secondary Dressing: ABD Pad, 5x9 (Home Health) 2 x Per Week/30 Days Discharge Instructions: Apply over primary dressing as directed. Compression Wrap: Kerlix Roll 4.5x3.1 (in/yd) (Home Health) 2 x Per Week/30 Days Discharge Instructions: Apply Kerlix and Coban compression , be sure to wrap from base of toes to tibial tuberosity. no too tight Compression Wrap: Coban Self-Adherent Wrap 4x5 (in/yd) (Home Health) 2 x Per Week/30 Days Discharge Instructions: Apply over Kerlix as directed. Patient Medications llergies: adhesive tape A Notifications Medication Indication Start End 05/22/2020 Augmentin DOSE oral 875 mg-125 mg tablet - 1tablet oral bid for 7days (continuing rx) Electronic Signature(s) Signed: 05/22/2020 10:15:56 AM By: Baltazar Najjar MD Entered By: Baltazar Najjar on 05/22/2020 10:15:53 -------------------------------------------------------------------------------- Problem List Details Patient Name: Date of Service: [name]. 05/22/2020 9:45 A M Medical Record Number: [id number] Patient Account Number: [id number] Date of Birth/Sex: Treating RN: [birth year] (59 y.o. [name] Primary Care Provider: [name] Other Clinician: Referring Provider: Treating Provider/Extender: [name], [name] in Treatment: 8 Active Problems ICD-10 Encounter Code Description Active Date MDM Diagnosis I89.0 Lymphedema, not elsewhere classified 03/23/2020 No Yes T81.31XD Disruption of external operation (surgical) wound, not elsewhere classified, 03/23/2020 No Yes subsequent encounter S90.811D Abrasion, right foot, subsequent encounter 03/23/2020 No Yes L97.512 Non-pressure chronic ulcer of other part  of right foot with fat layer exposed 03/23/2020 No Yes L97.312 Non-pressure chronic ulcer of right ankle with fat layer exposed 03/23/2020 No Yes L97.322 Non-pressure chronic ulcer of left ankle with fat layer exposed 03/23/2020 No Yes L89.616 Pressure-induced deep tissue damage of right heel 05/05/2020 No  Yes Inactive Problems ICD-10 Code Description Active Date Inactive Date L03.115 Cellulitis of right lower limb 04/07/2020 04/07/2020 Resolved Problems Electronic Signature(s) Signed: 05/23/2020 2:27:07 PM By: Baltazar Najjar MD Entered By: Baltazar Najjar on 05/22/2020 10:06:34 -------------------------------------------------------------------------------- Progress Note Details Patient Name: Date of Service: [name]. 05/22/2020 9:45 A M Medical Record Number: [id number] Patient Account Number: [id number] Date of Birth/Sex: Treating RN: Sep 01, 1960 (59 y.o. [name] Primary Care Provider: [name] Other Clinician: Referring Provider: Treating Provider/Extender: [name], [name] in Treatment: 8 Subjective History of Present Illness (HPI) 11/23/15; this is a patient I remember from a previous stay in this clinic in 2012. The patient says this was for a wound in this same area as currently although I have no information to verify that. Most of the information is provided by his caregiver from the group home where he resides. Apparently he has had a wound in this area for 8 months. He also has erythema around this area and has had multiple rounds of antibiotics apparently with some improvement but the erythema is persists. He apparently had a fracture in the ankle 2 months ago and was seen by Dr. Lajoyce Corners . He apparently had a splint applied and more recently a heel offloading boot. He had home health coming out to a week ago not clear what they are dressing this with but they apparently discharged him perhaps because of one of Dr. Audrie Lia socks The  patient has cerebral palsy and has a functional quadriparetic. He has a Nurse, adult for transferring at home he is nonambulatory and does not wear footwear. He is not a diabetic. Looking through [facility] link he appears to have fairly active primary care. The area on the ankle is variably labeled as a pressure area and/or chronic venous insufficiency. He had a recent venous ultrasound on 5/25 that did not show a DVT in the left leg however this was not a reflux study. There was no superficial DVT either. Arterial studies on 10/15/14 showed a left ABI of 1.16 Center right of 1.12 waveforms were triphasic he does not have significant arterial disease. He has had recent x-rays of the left foot and ankle. The left foot did not show any abnormality. Left ankle x-ray showed a spiral fracture of the left tibial diaphysis without evidence of osteomyelitis. 11/30/15 culture I did last week showed pseudomonas I changed him to oral ciprofloxacin. Erythema is a lot better. 12/07/15 area around the wound shows considerable improvement of the periwound erythema. 12/14/15; the patient has a improvement of the periwound erythema which in retrospect I think with cellulitis successfully treated. We have been using Iodoflex started last week 12/25/15 wound on the left medial malleolus and dorsal left foot. Theraskin #1 applied READMISSION 02/27/16; the patient was admitted to hospital from 8/14 through 01/18/16 initially felt to have cellulitis of his left lower leg however he was noted to have significant persistent pain and swelling. A duplex ultrasound was ordered that was negative. Obie Dredge an x-ray was done that showed a spiral fracture of the left leg. With posterior displacement and angulation at the mid left tibia. Orthopedics saw the patient and splinted the leg. He was then sent to [facility] skilled facility and only return to his group home last week. He has wounds on the left medial malleolus, left dorsal foot  and a worrisome area on the plantar left heel which is not really open but may represent a hematoma or a DTI 03/06/16; areas of left  anterior and medial ankle both look improved. There are 3 small wounds here. He has a area over the dorsal left first metatarsal head which is not open. In meantime his left heel has opened 03/14/16 the medial left ankle wounds looks stable to improved. The area on the posterior left heel has opened up and had a very adherent eschar and slept over this 03/21/16 patient has 3 wounds on his left medial ankle is stable area on his left dorsal first metatarsal head appears to be improved. Continued problems with adherent eschar over the left heel 03/28/16; patient has 3 wounds on his left medial ankle and lower leg all of these appear to be stable. The problem continues to be the left heel and the adherent eschar. This may have been a pressure ulcer from her brace at one point. He has a English as a second language teacher we have been using [employer] and all wound areas. Dressings are being changed by home health 04/04/16; the patient's areas on the medial aspect of the left ankle looks superficial and appear to be gradually closing. The area on the plantar aspects/Achilles aspect of his left heel as surface slough and nonviable subcutaneous tissue requiring debridement. 04/11/16; the areas on the patient's medial left ankle appear to be superficial and appear to be closing. The area on the plantar heel on the left has surface slough and nonviable subcutaneous tissue requiring debridement. I don't think this is too much different from last week there is also undermining 04/25/16; patient is followed in his group home by [facility] home health. The area on the plantar heel is smaller but with still with some depth. We're using Iodoflex to this. The areas on the left ankle looks healthy but dry and somewhat larger. Cause of this is not really clear using Prisma 05/02/16; areas x3 all look improved  expecially the areas on the medial ankle. Heel wound is smaller but still deep 05/09/16; all areas o3 look improved on the medial ankle. Heel wound is also contracting. 12/0.9/17 the areas on his left medial ankle are all closed. I think these are largely chronic venous insufficiency however there is no edema. The area on his left heel was a pressure area. READMISSION 08/02/16; this man is a patient that we have had in this clinic on several times before. I believe he has cerebral palsy is nonambulatory. He has chronic venous insufficiency with venous inflammation. He was last year in the fall of 2017 with areas on his left medial ankle left heel and left dorsal foot. He arrives today with once again a vague history. As mentioned he is nonambulatory he spends most of his day in a pressure offloading boot nevertheless they noted wounds in this area on the medial left ankle 2 weeks ago. There is any other history here I am uncertain 08/15/16; the patient's wounds on his medial ankle actually look better however the area on the left lateral/dorsal foot is covered in black necrotic eschar. Previous formal arterial studies did not show significant arterial disease with normal ABIs and triphasic waveforms. He is immobilized and at risk for pressure although he seems aware protective boots at all times as far as I am aware. 08/22/16; in general his wounds look better. All the wounds on his medial left ankle look improved the area on his lateral foot however continues to be problematic. Patient comes in the clinic today in a new wheelchair he is quite delighted 09/05/16; a wound on the left medial Calf and left  dorsal foot are healed. The area on the left lateral foot is improved however the area on the left medial malleolus is inflamed with a considerable degree of surrounding erythema. This looks like cellulitis. ABIs and clinical exam are within normal limits 09/12/16; the patient continues to have a wound on  the left medial calf. I was concerned about cellulitis in this area last week and gave him a course of doxycycline. The wound looks better here. The area on the left lateral foot however is unchanged 09/20/16; the patient came in this week early after traumatizing his toe on the left foot on a table while moving around in his motorized wheelchair. 09/26/16- he is here for follow-up evaluation of his left foot and malleolus ulcers. Since his appointment he abraded his left medial hallux against a table while in his motorized wheelchair, apparently trying to pull up to the table for a meal. He continues to complain of tenderness to the left lateral foot, surrounding the current ulcer 10/07/16; left foot and malleolus ulcers. 10/14/16; the patient is doing well. He has a wound on the left lateral, left medial malleolus and atraumatic wound on the left great toe. Although these appear to be superficial and closing 10/31/16; the patient is doing well. The area on the left lateral foot, left medial malleolus and the left great toe or all epithelialized. He has home health 11/14/16; patient arrives today with the area on the left great toe healed. The left lateral foot however in the left medial malleolus and the ankle superficial wounds that are slightly larger. He has erythema and tenderness around the area on the left lateral foot. As well he is complaining of dysuria. His caregiver notes that when he gets like this he is more lethargic which indeed seems to be the case today 11/21/16; left great toe is still healed. His wound on the left lateral foot appears healthy and slightly smaller also the area on the left medial ankle. This had black eschar on it which I removed. The remaining wound looks satisfactory 12/12/16; patient hasn't been here in a number of weeks. The area on the left lateral foot is healed however on his medial ankle has 2 open areas one of them covered with a necrotic eschar. He also has a new  wound on the right second toe which was apparently trauma induced while he was at a movie 12/27/16; the patient has wounds on his left medial mallelus and left lateral foot that are just about closed. The area on the right second toe which was trauma from last time is still open but again everything looks a lot better here. 01/30/17; patient arrives in clinic today and is really been a long time since I've seen him. The wounds are all epithelialized on the left foot this includes his left lateral foot, left medial malleolus. Apparently was in the ER for what of I think was a fungal infection on his buttock's he was prescribed Diflucan and doxycycline I think this is already getting better. READMISSION T is a now 59 year old man who has advanced cerebral palsy and functional quadriplegia. We have had him in this clinic multiple times before for wounds erry on his right ankle, left ankle, left dorsal foot and most recently from 08/02/16 through 01/30/17 with his left medial ankle and left foot and finally right second toe. His attendant from the group home where he lives is very knowledgeable about him. She states that they noticed breakdown in his skin in  his feet sometime in January although they could not get an appointment until today in this clinic. The previous wounds have always been felt to be secondary to incidental trauma/pressure areas or possibly some degree of chronic venous insufficiency. He had normal arterial studies in may of 2016. He has definite open wounds on the right lateral foot over the base of the fifth metatarsal, the right second toe DIP, the left medial malleolus. He has concerning areas over the left lateral fifth metatarsal base, retaform purpura over the medial left ankle and perhaps the medial left first toe. 07/21/17; culture we did last week from the base of the right fifth metatarsal grew group G strep. This should've been sensitive to the doxycycline I gave him. The other  major wounds are on the right second toe DIP, left medial malleolus, base of the left lateral fifth metatarsal and the medial left ankle and medial left toe. 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 We have macrovascular arterial studies scheduled for Thursday although I don't think this is what the problem is. 07/28/17; patient arrives with really no improvement. He has a wound on the right second toe, lateral right foot, right medial malleolus which was new this week, left medial malleolus, but looked like a DTI on the left great toe. His macrovascular studies are on 07/30/17 although I would be surprised if this provides all the explanation. He has small areas even on his right second and third toe which look like splinter-type hemorrhages. The question I have is where is this coming from. I'd like to see the vascular studies however before we pursue this. He has had a DVT in the past but is no longer on anticoagulants. I'll need to review his history 08/04/17; the patient had his ARTERIAL STUDIES this showed a right ABI of 1.16 and the left ABI of 1.20. TBI on the left at 0.84. He is at a right first toe amputation. The studies were unchanged from studies in May 2016. It was not felt that he had significant lower extremity arterial disease In the meantime we are using silver alginate to the wounds on the right second toe right lateral foot left medial malleolus. I'm going to change to Silver collagen today. 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 Working him up for this would include an echocardiogram probably a CT scan of the chest and abdomen to look at the major thoracic and abdominal aortic vessels. I am not going to involve myself in this unless there is more symptoms/signs 08/18/17; the patient arrives today with wounds looking somewhat better. The  remaining areas are the right second toe dorsal, right lateral foot and left medial malleolus which only has a small open area. We've been using silver collagen 09/01/17;right second toe dorsally probes to bone today. This is a deterioration. We've been using silver collagen. Only a small open area remains on the left medial malleolus. 09/09/17; right second toe appears better. There is no probing bone. He has a small area remaining on the left medial malleolus, the right lateral foot. We have been using silver collagen 10/07/17; the patient returns to clinic today after a month hiatus. He has wounds over the right second toe, right lateral foot and the left medial malleolus. We have been using silver collagen. He has home health. 10/24/17; patient has open wounds over the right second toe, now the left  fourth toe and a superficial area over his left buttock. Some of the other areas have healed including the left medial malleolus and right lateral foot. Using silver collagen on these wounds 11/07/17; the patient has closed his wounds on the right second toe and left fourth toe although they look all normal. Last time he was here he had a superficial area over the left buttock this is also closed. The right lateral foot wound I had closed out last time although it is seems to have reopened today. 628/19 the areas over the right second and left fourth toe remains closed. The right lateral foot has tightly adherent necrotic surface over. We have been using silver collagen he offloads this and a bunny boot 12/12/17; the patient has reopened the recurrent area over the right second PIP joint. 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 He does not have obvious macrovascular disease. The area over the right lateral foot looks about the same as last time tightly adherent necrotic debris. We've been using Hydrofera Blue 12/25/17; 2 week follow-up. The area over  the right second PIP is closed however the area on the right lateral foot is worse with surrounding erythema and pain compatible with cellulitis is going to need an antibiotic. ooAS WELL..... We have looked at his buttocks and lower back. He has 3 stage II ulcers. The exact history here is unclear however he is very disabled man with cerebral palsy. He goes to a day program from the group home in which she lives 5 days a week and I think he is on his buttock all day on these visits. Surrounding the wounds see has extensive stage I injury as well. ooFINALLY it looks as though he has lost weight. I'm not the only staff member that is recognized this 01/08/18 on evaluation today patient appears to be doing better in regard to his gluteal wounds. In fact everything appears to be healed back here although very dry. He has a couple open sores on the scrotal region other than this it is really his foot that is still giving him the most trouble. He did complete the Keflex unfortunately it seems like there's still a lot of erythema in this in fact looks worse than it did previous. I'm gonna culture this today and likely start with a different antibiotic. 01/16/18; I was reassured that the wounds on his buttocks and sacrum are healed. I did not look at these the day. The culture that was done last week on 01/11/18 of the right lateral foot wounds showed Pseudomonas. Bactrim is not a good choice for this. Ciprofloxacin interacts with his muscle relaxants therefore I prescribed cefdinir 300 twice a day for 7 days. Bactrim coverage of pseudomonas is not reliable although the degree of erythema looks a lot better 01/30/18; the patient has completed his antibiotics. The area does not have infection on the right lateral foot. The wounds look a lot better. We've been using silver alginate 02/12/18; there is on the right lateral foot. Copious amounts of surface eschar. Also similarly over the PIP of the right second toe.  We've been using silver alginate to date 03/02/2018; the area that remains is on the right lateral foot. The right second toe is healed. 03/16/2018; the area there is original wound on the right lateral foot. Still requiring debridement. We are using Hydrofera Blue. The right second toe is still healed/closed 03/30/2018; right lateral foot there is  2 small open areas remaining. They actually look quite healthy we have been using Hydrofera Blue the right second toe remains closed 04/13/2018; right lateral foot. 1 of the 2 small open areas from last time has closed over. The other had surface slough that I removed with a #3 curette. This looks quite healthy. The right second toe remains closed. We are using Hydrofera Blue 05/01/2018; right lateral foot very superficial areas that look like they are trying to close over. He had a new threatened area on the left medial ankle just below the medial malleolus. I think these represent chronic venous insufficiency areas 05/14/2018; the right right lateral foot looks like it closed over as was predicted last time however he has some dark areas subcutaneously erythema superiorly and this is really very tender. This is either coexistent cellulitis or perhaps a deep tissue injury. The area on the left medial ankle is superficial but has some size. We have been using Hydrofera Blue to both areas. The patient complains of pain in his right foot 06/09/2018; right lateral foot was closed over last time but there was coexistent cellulitis around this. I gave him Levaquin. He also has an area on the left medial malleolus. He has not been back to clinic in over 3-1/2 weeks. We are using Hydrofera Blue I think to the left medial malleolus and silver alginate to the right lateral foot 1/21; right lateral foot still non-viable debris over this area and the left medial malleolus. Both of these vigorously washed off with antiseptic gauze. We have been using silver  alginate. 2/4; right lateral foot this looks somewhat better. Unfortunately the area over the left medial malleolus has deteriorated. Using silver alginate in both area 2/18; both wounds arrived today covered in a lot of necrotic debris. We have been using silver alginate. Clearly not making a lot of progress. 3/3; the patient's right second toe is reopened over the PIP. I am not sure why this is happening. He may have microvascular disease. We have checked his macrovascular flow in the past and found it to be normal. He may need an x-ray. The area on the right lateral foot requires debridement. The area on the left lateral ankle is worse. We have been using PolyMem Ag 08/26/18 on evaluation today patient appears to be doing a little worse in regard to his lower extremities based on what I'm seeing what the nursing staff is telling me. His left medial ankle appears potential to be infected there is your thing on want surrounding the wound. His right second toe is also of concern at this point in my pinion. We have not done x-rays of these areas that may be a good idea to go ahead and proceed with at this point to ensure will not deal with anything such as osteomyelitis. 4/16; it is been a while since I saw this patient. He now has wounds on the right lateral foot, dorsal right second toe, substantially on the left medial malleolus and a small area on the left lateral foot. We have been using PolyMem Silver. Curlex and Coban. He has home health changing the dressing. Since last time he was here he had a culture of the left medial ankle that showed MRSA. He is completed a course/7 days of doxycycline. XRAYS of the left ankle and right foot that were ordered were negative for osteomyelitis 4/30; he arrives in clinic today with some maceration around the wound and a lot of tenderness and erythema in a  circumferential area around the right lateral foot. He has wound areas on the right lateral foot dorsal  right second toe, largely over the left medial malleolus and a small area over the left lateral foot 5/7; completed his antibiotics from last time right lateral foot cellulitis is better and the area on his left knee which we discovered after he had been seen by the provider also is improved. He still has extensive wound areas over the right lateral foot, left medial malleolus, right second toe and left lateral foot although all of these look somewhat better with PolyMem 5/15. Wounds on the right lateral foot, right second toe left medial malleolus and the left lateral foot. All of these look somewhat better. We have been using PolyMem Ag 5/26; the wound on the right lateral foot is almost closed over but he still complains of a lot of pain in this area. He has a eschared area on the right second toe but no open wound.Geoffry Paradise concerning today his original wound on the left medial malleolus looks healthy however just inferior to this almost areas of deep tissue injury and there is an area on the left lateral foot also looks like a deep tissue injury. In the past I have wondered whether he might have cholesterol emboli or some other form of a vasculopathy. I wonder whether this could have something to do with this. 6/2; right lateral foot wound is still open I gave him empiric antibiotics last week without really a lot of change she is still complaining of pain in this area. We x- rayed his feet at some point in April these were negative. It would be difficult to do an MRI. The area on his left medial malleolus looks better. Right second toe is healed. He still has what looks to be a small pressure related area on the left lateral foot but it is not open. We are using PolyMem to all wound which seems to be doing nicely 6/16; right lateral foot wound is still open. The wound looks better although there is still a lot of tenderness around it. Rather than more empiric antibiotics I have cultured this  today. We are using PolyMem Ag. On the left lateral malleolus the wound appears better. He has a new area on the left lower anterior pretibial area 6/29; 2-week follow-up. Culture I did of the right lateral foot 2 weeks ago showed MRSA. I gave him 2 weeks of Septra which he should be just about finishing now and this seems better. We got a call from home health last week to report swelling in the left leg because we were listed as his primary. We referred him back to primary care. He arrives today with intense erythema and tenderness around the 2 wounds on the left medial ankle on the left lower calf. The area on the right lateral foot looks better 7/7; I brought him back this week to follow-up on his left foot which had cellulitis last week. I gave him clindamycin which as far as I know he tolerated. The erythema is better. He has a 3 current wounds one on the right lateral foot a small area on the right lateral malleolus and the more recent area on the left anterior pretibial area. 7/21; bilateral distal lower leg and foot wounds. The area on his right lateral foot looks a lot better. He has an area on his left pretibial area distally. The area on the medial malleolus on the left which is 1  of his worst wounds has closed over. He does not appear to have any open areas in his remaining toes 8/4-Patient returns at 2 weeks, he has the remaining wound on the left lower leg the other 2 wounds have healed. Patient is continued to be treated with silver alginate with a 2 layer wrap on the left 8/18; I been following this patient for a long period for bilateral lower extremity wounds. 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 His wounds are probably pressure although he religiously wears thick bunny boots. He does not have macrovascular disease but I did formal testing on him perhaps 2 or 3 years ago. I have often wondered about either cholesterol emboli or  a vasculopathy of some sort given the distal nature of these variable locations etc. He has 2 areas that we have been following one on the left medial lower leg and one on the right lateral foot. Both of these are mostly healed today there is some eschar over sections of both areas although I did not disturb this. 03/31/2019 on evaluation today patient appears to be doing a little bit more poorly in regard to the sacral region. He has 2 small wounds noted at this point. Fortunately there is no signs of active infection at this time. No fevers, chills, nausea, vomiting, or diarrhea. He is having some pain when he sits on his gluteal region a big part of the reason he has these wounds is likely due to the fact that his wheelchair has not been functioning properly. He does still have a couple areas of deep tissue injury on his lower extremities he does need new Prevalon offloading boots as well. 04/28/2019 on evaluation today patient appears to be doing well with regard to his wounds in particular. His ankle area has fairly small based on what I am seeing currently and his sacral region actually is still open but does not appear to be doing too poorly in my opinion. Fortunately there is no signs of active infection at this time. He was in the hospital unfortunately and this was from 04/08/2019 through 04/12/2019 secondary to persistent diarrhea, dehydration and acute kidney injury. With that being said fortunately the patient does not seem to be doing too poorly at this time which is good news. He is having pain mainly in the sacral region. 06/23/2019 on evaluation today patient appears to be doing well with regard to his sacral region which in fact is completely healed. Unfortunately he has an area of rubbing/pressure on his right lateral chest/back region. This is where it is rubbing up on his chair. Subsequently he also has an open wound on the left medial malleolus which is not very open but starting to  crack and then on the right lateral foot this is much more open at this point. Unfortunately there is no signs of significant improvement in regard to these areas and in fact there does appear to be evidence of active infection quite significantly. No fevers, chills, nausea, vomiting, or diarrhea. The patient did test positive for COVID-19 that has been greater than 14 days ago although they did not know the exact timing. That is why he was not here for his last appointment. 07/07/2019 upon evaluation today patient actually appears to be doing quite well in regard to his left ankle as well as the chest/back region which is doing great in fact appears to be healed in my opinion. He still has an issue  on his lateral right foot that is open and appears to be infected but does have me more concerned. In fact I do believe the doxycycline helped in general for the cellulitis but I believe that may have been more MRSA related based on the culture that we did. What I am seeing right now has more of the blue-green drainage and may be more consistent with a Pseudomonas that I was hoping was not really causing much of an issue but I think it may be at this point on the right lateral foot. For that reason I do think treatment would be a good idea of the reason I avoided the Cipro before was the potential for QT prolongation which could obviously cause problems with the patient with results of interaction with respiratory wound. The tizanidine also had some interactions as well. Nonetheless I think at this point that it would be a possibility for [place] to use topical gentamicin to see if this could be of benefit for the patient underneath the silver alginate dressing. 07/21/2019 upon evaluation today patient appears to be doing fairly well at this point in regard to his lower extremity on the right. He does have a wound on his right lateral foot as well as the right fifth toe and the right fourth toe. Unfortunately he  continues to experience significant issues with pressure and trauma to some degree and I think this is just due to the fact that again he is not really not able to control his legs and what is happening here. Fortunately there is no signs of active infection at this time. 08/04/2019 upon evaluation today patient actually appears to be doing quite well with regard to his toe ulcers I am very pleased in that regard. Unfortunately the foot ulcer is showing some signs of erythema his primary care provider has placed him on clindamycin at this point. Again I do believe this can be beneficial for him. With that being said I do think the patient may benefit from a light Kerlix and Coban wrap to help with some of his edema at this point. 08/18/2019 upon evaluation today patient appears to be doing still poorly in regard to his lateral foot ulcer. This is not very deep but there is some concern about the possibility of osteomyelitis. I think it would be appropriate for [place] to go ahead and have an x-ray at this time. The patient is currently on clindamycin which from his culture which was performed on 06/23/2019 it does appear that he would benefit from that in regard to the MRSA. However Pseudomonas was also identified then and the patient actually has some blue-green drainage at this point as well. I am much more concerned about the possibility of the Pseudomonas being an infection is causative agent at this point that I am the MRSA. Subsequently Cipro the patient was sensitive to but we never use that is stable use a topical gent due to the fact that unfortunately there was some interaction between the Cipro and the patient's risk for down potentially causing QT prolongation. 08/31/2019; have not seen this patient in quite a period of time. He has a substantial wound on his right lateral foot we have been using gentamicin ointment with Hydrofera Blue over the top. From what I am able to determine he is also on  antibiotics perhaps clindamycin although I have not had a chance to look over this. He also has an eschared area on the right fifth toe as well as  the right second toe. 4/20; right lateral foot wound. He comes in today with intense erythema and tenderness around this wound. The wound itself has a surface on it but it doesn't look particularly healthy a lot of gelatinous debris. There is no palpable bone. We've been using gentamicin ointment and Hydrofera Blue. Since the patient was last here he was seen by Dr. Algis Liming of infectious disease. He ordered an MRI that I think is due later this month on 4/30. He apparently also checked a sedimentation rate and C-reactive protein metabolic panel CBC with differential. At the time he was seen he wanted to withhold any microbials pending identification of the depth of the infection although looking at this I don't know that I'm going to be able to hold off for another 10 days. 4/29; he went on to have the MRI that was ordered by Dr. Algis Liming of infectious disease. 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 I had given him cefdinir last week for surrounding cellulitis he is finishing this today. I have communicated with Dr. Algis Liming who wants bone for pathology and culture before proceeding with consideration of IV antibiotics. Fortunately he is due to see Dr. Samuella Cota of podiatry today I am hopeful that the bone biopsy and culture will be considered there 5/4 as I understand things Dr. Samuella Cota actually wants to remove the diseased proximal fifth metatarsal. Looking back at this decision is probably not a bad one. He has good blood flow he is nonambulatory they are apparently going to do this next week. Paradoxically the wound on the lateral part of his foot is actually a lot better. The cellulitis that was so problematic 2 weeks ago also has resolved. 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 The last time I saw him he had a diseased proximal fifth metatarsal. 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 Samuella Cota. Culture of this showed Pseudomonas and he was followed by infectious disease Dr. Algis Liming treated with ciprofloxacin. 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 He had Integra placed. A CandS showed Pseudomonas again. Since then he has had Silvadene as a primary dressing, then Santyl and more recently Xeroform and then back to Santyl 3 times a week per his attendant who comes with him. Reviewing his records I was really not prepared for the number of wounds that he currently has. This includes substantial areas on the right lateral foot, right lateral ankle, right fourth toe dorsally, right ankle anteriorly as well as the left lateral ankle and foot. His medical history is unchanged ABIs 1.2 on the right and 1.21 on the left he has not been known to have arterial insufficiency and is not a diabetic 11/5; patient I readmitted to the clinic last week. He has multiple wounds on the right lateral foot, right fourth toe and a new area on the right second toe. He has areas on the right dorsal ankle and a substantive wound over the left lateral ankle and foot. We are using silver collagen. We put him under compression last week and this seems to have helped. He has home health coming out one other time per week to change the dressing 11/12; no real improvement. Multiple bilateral foot and ankle wounds. ooOn the left he has a substantial  wound on the left lateral ankle ooOn the right he has wounds on the dorsal second and fourth toes. ooRight lateral foot we have merged to wounds these are necrotic ooShe has an area on the right dorsal ankle and a area on the right medial malleolus with marked surrounding erythema likely  cellulitis. The patient complains about pain that kept him awake all last night from wraps that were too tight. He is not known to have arterial insufficiency. 11/18; no real change. I gave him empiric Levaquin last week directed at previously cultured Pseudomonas this is not really helped. On the right he has wounds on the right lateral foot right lateral ankle right medial ankle and the dorsal second and fourth toes. Most of these do not have a nice have a healthy surface. There is erythema in his foot which may all be venous stasis. The area on the left lateral malleolus/ankle area looks better 12/3; really disappointing if anything deterioration especially on the right lateral foot. These are necrotic. He also has areas on the right dorsal ankle right medial malleolus and the right second and fourth toes. On the left he has a large area over the left medial ankle. There is some erythema around the wound. Things are very tender I have reviewed his most recent arterial Dopplers which are actually in 2019 but he has never been felt to have arterial issues. At that time his ABI on the right was 1.16 [no TBI due to amputation] on the left his ABI was 1.20 with a great toe pressure of 0.24. Waveforms on the right and left toes were all normal he was not felt to have significant evidence of lower extremity arterial disease. ABIs in our clinic have always been normal his pulses have been palpable. He had an MRI of his right foot in April 2021 this showed a soft tissue ulceration along the lateral midfoot with underlying osteomyelitis no abscess. 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 He returned to our clinic on 03/23/2020. The same pattern of wounds that I am describing currently this was a lot worse than earlier in the year. I gave him empiric antibiotics because he had had Pseudomonas in the past this does not seem to have helped. We have not made any progress on any of these wound site. The  patient is in a lot of pain 12/10; the patient comes in with a new deep tissue injury on the right heel large necrotic wound on the right lateral foot, smaller necrotic wound on the dorsal ankle and medial ankle. Necrotic wounds on the second and fourth toes. We have been using Hydrofera Blue On the left he has the area on the left little medial ankle which actually looks some better healthier looking tissue. We are using Hydrofera Blue here as well X-ray ordered last week showed no osseous erosion no plain film indication of osteomyelitis. 12/17; PCR culture I did of the right lateral foot wound last week showed a large titer of Enterococcus faecalis and low titers of of coag negative staph and Peptostreptococcus. I have started him on Augmentin for 2 weeks but I may continue beyond this. He has continuous complaints of pain now apparently making it difficult for him to rest at night. Substantial wound area in the right foot and ankle. Unfortunately the left medial foot seems to be more substantial this week. 12/27; in general the wounds on the right foot and ankle and the left medial ankle look better.  There is certainly less erythema in the right foot. I have him on Augmentin and I am going to extend this for another week. MRI is booked for 06/01/2020 Objective Constitutional Sitting or standing Blood Pressure is within target range for patient.. Pulse regular and within target range for patient.Marland Kitchen Respirations regular, non-labored and within target range.. Temperature is normal and within the target range for the patient.Marland Kitchen Appears in no distress. Vitals Time Taken: 9:40 AM, Temperature: 98 F, Pulse: 93 bpm, Respiratory Rate: 16 breaths/min, Blood Pressure: 113/75 mmHg. General Notes: Wound exam; on the left the wound medially over the ankle has split into 2 wounds each of which look healthy. ooOn the right the right medial malleolus required debridement with a #5 curette area of necrotic debris  on the surface. Right lateral foot required a more extensive debridement still necrotic material he has exposed tendon here but I did not debride the tendon. Anterior ankle looks better. Second and fourth toes looks better. Skin around these wounds has no erythema no tenderness quite a bit better Integumentary (Hair, Skin) Wound #40 status is Open. Original cause of wound was Gradually Appeared. The wound is located on the Left,Medial Lower Leg. The wound measures 1.3cm length x 1.7cm width x 0.1cm depth; 1.736cm^2 area and 0.174cm^3 volume. There is Fat Layer (Subcutaneous Tissue) exposed. There is no tunneling or undermining noted. There is a medium amount of serosanguineous drainage noted. The wound margin is flat and intact. There is large (67-100%) red, friable granulation within the wound bed. There is no necrotic tissue within the wound bed. Wound #41 status is Open. Original cause of wound was Trauma. The wound is located on the Right T Fourth. The wound measures 1.2cm length x 1cm width oe x 0.1cm depth; 0.942cm^2 area and 0.094cm^3 volume. There is Fat Layer (Subcutaneous Tissue) exposed. There is no tunneling or undermining noted. There is a medium amount of serosanguineous drainage noted. The wound margin is flat and intact. There is large (67-100%) pink granulation within the wound bed. There is a small (1-33%) amount of necrotic tissue within the wound bed including Adherent Slough. Wound #42 status is Open. Original cause of wound was Gradually Appeared. The wound is located on the Right,Medial Malleolus. The wound measures 2.9cm length x 2.7cm width x 0.1cm depth; 6.15cm^2 area and 0.615cm^3 volume. There is Fat Layer (Subcutaneous Tissue) exposed. There is no tunneling or undermining noted. There is a medium amount of serosanguineous drainage noted. The wound margin is flat and intact. There is large (67-100%) pink, friable granulation within the wound bed. There is a small (1-33%)  amount of necrotic tissue within the wound bed including Adherent Slough. Wound #43 status is Open. Original cause of wound was Pressure Injury. The wound is located on the Right,Distal,Anterior Lower Leg. The wound measures 2.8cm length x 0.9cm width x 0.1cm depth; 1.979cm^2 area and 0.198cm^3 volume. There is Fat Layer (Subcutaneous Tissue) exposed. There is no tunneling or undermining noted. There is a small amount of serosanguineous drainage noted. The wound margin is flat and intact. There is large (67-100%) red granulation within the wound bed. There is a small (1-33%) amount of necrotic tissue within the wound bed including Adherent Slough. Wound #44 status is Open. Original cause of wound was Pressure Injury. The wound is located on the Right,Proximal,Lateral Foot. The wound measures 5.3cm length x 4.5cm width x 0.4cm depth; 18.732cm^2 area and 7.493cm^3 volume. There is tendon and Fat Layer (Subcutaneous Tissue) exposed. There is no tunneling  or undermining noted. There is a large amount of purulent drainage noted. The wound margin is flat and intact. There is small (1-33%) pink granulation within the wound bed. There is a large (67-100%) amount of necrotic tissue within the wound bed including Adherent Slough. Wound #46 status is Open. Original cause of wound was Gradually Appeared. The wound is located on the Right T Second. The wound measures 0.4cm oe length x 0.2cm width x 0.2cm depth; 0.063cm^2 area and 0.013cm^3 volume. There is Fat Layer (Subcutaneous Tissue) exposed. There is no tunneling or undermining noted. There is a medium amount of serosanguineous drainage noted. The wound margin is flat and intact. There is small (1-33%) pink granulation within the wound bed. There is a large (67-100%) amount of necrotic tissue within the wound bed including Adherent Slough. Wound #47 status is Open. Original cause of wound was Gradually Appeared. The wound is located on the Left,Medial  Malleolus. The wound measures 2.8cm length x 3.5cm width x 0.1cm depth; 7.697cm^2 area and 0.77cm^3 volume. There is Fat Layer (Subcutaneous Tissue) exposed. There is no tunneling or undermining noted. There is a medium amount of serosanguineous drainage noted. The wound margin is distinct with the outline attached to the wound base. There is large (67-100%) red, pink granulation within the wound bed. There is a small (1-33%) amount of necrotic tissue within the wound bed including Adherent Slough. 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 There was a Excisional Skin/Subcutaneous Tissue Debridement with a total area of 7.83 sq cm performed by Maxwell Caul., MD. With the following instrument(s): Curette to remove Viable and Non-Viable tissue/material. Material removed includes Subcutaneous Tissue, Slough, Skin: Dermis, Skin: Epidermis, and Fibrin/Exudate after achieving pain control using Other (benzocaine 20%). A time out was conducted at 09:58, prior to the start of the procedure. A Minimum amount of bleeding was controlled with Pressure. The procedure was tolerated well with a pain level of 0 throughout and a pain level of 2 following the procedure. Post Debridement Measurements: 2.9cm length x 2.7cm width x 0.1cm depth; 0.615cm^3 volume. Character of Wound/Ulcer Post Debridement requires  further debridement. Severity of Tissue Post Debridement is: Fat layer exposed. Post procedure Diagnosis Wound #42: Same as Pre-Procedure Wound #44 Pre-procedure diagnosis of Wound #44 is a Pressure Ulcer located on the Right,Proximal,Lateral Foot . There was a Excisional Skin/Subcutaneous Tissue Debridement with a total area of 23.85 sq cm performed by Maxwell Caul., MD. With the following instrument(s): Curette to remove Viable and Non- Viable tissue/material. Material removed includes Subcutaneous Tissue, Slough, Skin: Dermis, Skin: Epidermis, and Fibrin/Exudate after achieving pain control using Other (benzocaine 20%). A time out was conducted at 09:58, prior to the start of the procedure. A Minimum amount of bleeding was controlled with Pressure. The procedure was tolerated well with a pain level of 0 throughout and a pain level of 2 following the procedure. Post Debridement Measurements: 5.3cm length x 4.5cm width x 0.4cm depth; 7.493cm^3 volume. Post debridement Stage noted as Category/Stage IV. Character  of Wound/Ulcer Post Debridement requires further debridement. Post procedure Diagnosis Wound #44: Same as Pre-Procedure Plan Follow-up Appointments: Return Appointment in 1 week. Bathing/ Shower/ Hygiene: May shower with protection but do not get wound dressing(s) wet. Edema Control - Lymphedema / SCD / Other: Elevate legs to the level of the heart or above for 30 minutes daily and/or when sitting, a frequency of: Off-Loading: Heel suspension boot to: - heel protectors at all times Other: - avoid any pressure to back of heels Home Health: No change in wound care orders this week; continue Home Health for wound care. May utilize formulary equivalent dressing for wound treatment orders unless otherwise specified. Other Home Health Orders/Instructions: - [facility] The following medication(s) was prescribed: Augmentin oral 875 mg-125 mg tablet 1tablet oral bid for 7days (continuing  rx) starting 05/22/2020 WOUND #40: - Lower Leg Wound Laterality: Left, Medial Cleanser: Wound Cleanser (Home Health) 2 x Per Week/30 Days Discharge Instructions: Cleanse the wound with wound cleanser prior to applying a clean dressing using gauze sponges, not tissue or cotton balls. Peri-Wound Care: Sween Lotion (Moisturizing lotion) (Home Health) 2 x Per Week/30 Days Discharge Instructions: Apply moisturizing lotion with dressing changes Prim Dressing: Hydrofera Blue Classic Foam, 4x4 in (Home Health) 2 x Per Week/30 Days ary Discharge Instructions: Apply to wound bed as instructed Secondary Dressing: ABD Pad, 5x9 (Home Health) 2 x Per Week/30 Days Discharge Instructions: Apply over primary dressing as directed. Com pression Wrap: Kerlix Roll 4.5x3.1 (in/yd) (Home Health) 2 x Per Week/30 Days Discharge Instructions: Apply Kerlix and Coban compression , be sure to wrap from base of toes to tibial tuberosity. no too tight Com pression Wrap: Coban Self-Adherent Wrap 4x5 (in/yd) (Home Health) 2 x Per Week/30 Days Discharge Instructions: Apply over Kerlix as directed. WOUND #41: - T Fourth Wound Laterality: Right oe Cleanser: Wound Cleanser (Home Health) 2 x Per Week/30 Days Discharge Instructions: Cleanse the wound with wound cleanser prior to applying a clean dressing using gauze sponges, not tissue or cotton balls. Peri-Wound Care: Sween Lotion (Moisturizing lotion) 2 x Per Week/30 Days Discharge Instructions: Apply moisturizing lotion with dressing changes Topical: Bactroban (Home Health) 2 x Per Week/30 Days Discharge Instructions: apply to wound bed under alginate Prim Dressing: KerraCel Ag Gelling Fiber Dressing, 4x5 in (silver alginate) (Home Health) 2 x Per Week/30 Days ary Discharge Instructions: Apply silver alginate to wound bed as instructed Secondary Dressing: Woven Gauze Sponge, Non-Sterile 4x4 in (Home Health) 2 x Per Week/30 Days Discharge Instructions: Apply over primary  dressing as directed. Secondary Dressing: ABD Pad, 5x9 (Home Health) 2 x Per Week/30 Days Discharge Instructions: Apply over primary dressing as directed. Com pression Wrap: Kerlix Roll 4.5x3.1 (in/yd) (Home Health) 2 x Per Week/30 Days Discharge Instructions: Apply Kerlix and Coban compression as directed. Com pression Wrap: Coban Self-Adherent Wrap 4x5 (in/yd) (Home Health) 2 x Per Week/30 Days Discharge Instructions: Apply over Kerlix as directed. WOUND #42: - Malleolus Wound Laterality: Right, Medial Cleanser: Wound Cleanser (Home Health) 2 x Per Week/30 Days Discharge Instructions: Cleanse the wound with wound cleanser prior to applying a clean dressing using gauze sponges, not tissue or cotton balls. Peri-Wound Care: Sween Lotion (Moisturizing lotion) 2 x Per Week/30 Days Discharge Instructions: Apply moisturizing lotion with dressing changes Topical: Bactroban (Home Health) 2 x Per Week/30 Days Discharge Instructions: apply to wound bed under alginate Prim Dressing: KerraCel Ag Gelling Fiber Dressing, 4x5 in (silver alginate) (Home Health) 2 x Per Week/30 Days ary Discharge Instructions: Apply silver alginate to  wound bed as instructed Secondary Dressing: Woven Gauze Sponge, Non-Sterile 4x4 in (Home Health) 2 x Per Week/30 Days Discharge Instructions: Apply over primary dressing as directed. Secondary Dressing: ABD Pad, 5x9 (Home Health) 2 x Per Week/30 Days Discharge Instructions: Apply over primary dressing as directed. Com pression Wrap: Kerlix Roll 4.5x3.1 (in/yd) (Home Health) 2 x Per Week/30 Days Discharge Instructions: Apply Kerlix and Coban compression as directed. Com pression Wrap: Coban Self-Adherent Wrap 4x5 (in/yd) (Home Health) 2 x Per Week/30 Days Discharge Instructions: Apply over Kerlix as directed. WOUND #43: - Lower Leg Wound Laterality: Right, Anterior, Distal Cleanser: Wound Cleanser (Home Health) 2 x Per Week/30 Days Discharge Instructions: Cleanse the wound  with wound cleanser prior to applying a clean dressing using gauze sponges, not tissue or cotton balls. Peri-Wound Care: Sween Lotion (Moisturizing lotion) 2 x Per Week/30 Days Discharge Instructions: Apply moisturizing lotion with dressing changes Topical: Bactroban (Home Health) 2 x Per Week/30 Days Discharge Instructions: apply to wound bed under alginate Prim Dressing: KerraCel Ag Gelling Fiber Dressing, 4x5 in (silver alginate) (Home Health) 2 x Per Week/30 Days ary Discharge Instructions: Apply silver alginate to wound bed as instructed Secondary Dressing: Woven Gauze Sponge, Non-Sterile 4x4 in (Home Health) 2 x Per Week/30 Days Discharge Instructions: Apply over primary dressing as directed. Secondary Dressing: ABD Pad, 5x9 (Home Health) 2 x Per Week/30 Days Discharge Instructions: Apply over primary dressing as directed. Com pression Wrap: Kerlix Roll 4.5x3.1 (in/yd) (Home Health) 2 x Per Week/30 Days Discharge Instructions: Apply Kerlix and Coban compression as directed. Com pression Wrap: Coban Self-Adherent Wrap 4x5 (in/yd) (Home Health) 2 x Per Week/30 Days Discharge Instructions: Apply over Kerlix as directed. WOUND #44: - Foot Wound Laterality: Right, Lateral, Proximal Cleanser: Wound Cleanser (Home Health) 2 x Per Week/30 Days Discharge Instructions: Cleanse the wound with wound cleanser prior to applying a clean dressing using gauze sponges, not tissue or cotton balls. Peri-Wound Care: Sween Lotion (Moisturizing lotion) 2 x Per Week/30 Days Discharge Instructions: Apply moisturizing lotion with dressing changes Topical: Bactroban (Home Health) 2 x Per Week/30 Days Discharge Instructions: apply to wound bed under alginate Prim Dressing: KerraCel Ag Gelling Fiber Dressing, 4x5 in (silver alginate) (Home Health) 2 x Per Week/30 Days ary Discharge Instructions: Apply silver alginate to wound bed as instructed Secondary Dressing: Woven Gauze Sponge, Non-Sterile 4x4 in (Home  Health) 2 x Per Week/30 Days Discharge Instructions: Apply over primary dressing as directed. Secondary Dressing: ABD Pad, 5x9 (Home Health) 2 x Per Week/30 Days Discharge Instructions: Apply over primary dressing as directed. Com pression Wrap: Kerlix Roll 4.5x3.1 (in/yd) (Home Health) 2 x Per Week/30 Days Discharge Instructions: Apply Kerlix and Coban compression as directed. Com pression Wrap: Coban Self-Adherent Wrap 4x5 (in/yd) (Home Health) 2 x Per Week/30 Days Discharge Instructions: Apply over Kerlix as directed. WOUND #46: - T Second Wound Laterality: Right oe Cleanser: Wound Cleanser (Home Health) 2 x Per Week/30 Days Discharge Instructions: Cleanse the wound with wound cleanser prior to applying a clean dressing using gauze sponges, not tissue or cotton balls. Peri-Wound Care: Sween Lotion (Moisturizing lotion) 2 x Per Week/30 Days Discharge Instructions: Apply moisturizing lotion with dressing changes Topical: Bactroban (Home Health) 2 x Per Week/30 Days Discharge Instructions: apply to wound bed under alginate Prim Dressing: KerraCel Ag Gelling Fiber Dressing, 4x5 in (silver alginate) (Home Health) 2 x Per Week/30 Days ary Discharge Instructions: Apply silver alginate to wound bed as instructed Secondary Dressing: Woven Gauze Sponge, Non-Sterile 4x4 in (Home Health) 2  x Per Week/30 Days Discharge Instructions: Apply over primary dressing as directed. Secondary Dressing: ABD Pad, 5x9 (Home Health) 2 x Per Week/30 Days Discharge Instructions: Apply over primary dressing as directed. Com pression Wrap: Kerlix Roll 4.5x3.1 (in/yd) (Home Health) 2 x Per Week/30 Days Discharge Instructions: Apply Kerlix and Coban compression as directed. Com pression Wrap: Coban Self-Adherent Wrap 4x5 (in/yd) (Home Health) 2 x Per Week/30 Days Discharge Instructions: Apply over Kerlix as directed. WOUND #47: - Malleolus Wound Laterality: Left, Medial Cleanser: Wound Cleanser (Home Health) 2 x Per  Week/30 Days Discharge Instructions: Cleanse the wound with wound cleanser prior to applying a clean dressing using gauze sponges, not tissue or cotton balls. Peri-Wound Care: Sween Lotion (Moisturizing lotion) (Home Health) 2 x Per Week/30 Days Discharge Instructions: Apply moisturizing lotion with dressing changes Prim Dressing: Hydrofera Blue Classic Foam, 4x4 in (Home Health) 2 x Per Week/30 Days ary Discharge Instructions: Apply to wound bed as instructed Secondary Dressing: ABD Pad, 5x9 (Home Health) 2 x Per Week/30 Days Discharge Instructions: Apply over primary dressing as directed. Com pression Wrap: Kerlix Roll 4.5x3.1 (in/yd) (Home Health) 2 x Per Week/30 Days Discharge Instructions: Apply Kerlix and Coban compression , be sure to wrap from base of toes to tibial tuberosity. no too tight Com pression Wrap: Coban Self-Adherent Wrap 4x5 (in/yd) (Home Health) 2 x Per Week/30 Days Discharge Instructions: Apply over Kerlix as directed. 1. I continued with silver alginate on all the wounds on the right foot and ankle. May be able to change to [facility] on these areas as well 2. Hydrofera Blue to continue on the left ankle 3. I extended his Augmentin for a further week 4. MRI is booked for 06/01/2020. This is to determine if there is underlying osteomyelitis here. The patient is already stated that he would prefer an amputation if that turns out to be true Electronic Signature(s) Signed: 05/22/2020 10:16:18 AM By: Baltazar Najjar MD Entered By: Baltazar Najjar on 05/22/2020 10:16:18 -------------------------------------------------------------------------------- SuperBill Details Patient Name: Date of Service: [name] 05/22/2020 Medical Record Number: [id number] Patient Account Number: [id number] Date of Birth/Sex: Treating RN: Feb 15, 1961 (59 y.o. [name] Primary Care Provider: [name] Other Clinician: Referring Provider: Treating Provider/Extender:  [name], [name] in Treatment: 8 Diagnosis Coding ICD-10 Codes Code Description I89.0 Lymphedema, not elsewhere classified T81.31XD Disruption of external operation (surgical) wound, not elsewhere classified, subsequent encounter S90.811D Abrasion, right foot, subsequent encounter 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 L97.322 Non-pressure chronic ulcer of left ankle with fat layer exposed L89.616 Pressure-induced deep tissue damage of right heel Facility Procedures CPT4 Code: [id number] Description: 11042 - DEB SUBQ TISSUE 20 SQ CM/< ICD-10 Diagnosis Description 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 Modifier: Quantity: 1 CPT4 Code: [id number] Description: 11045 - DEB SUBQ TISS EA ADDL 20CM ICD-10 Diagnosis Description 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 Modifier: Quantity: 1 Physician Procedures : CPT4 Code Description Modifier [id number] 11042 - WC PHYS SUBQ TISS 20 SQ CM ICD-10 Diagnosis Description L97.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 layer exposed L97.312 Non-pressure chronic ulcer of right ankle with fat  layer exposed [id number] 11045 -  WC PHYS SUBQ TISS EA ADDL 20 CM 1 ICD-10 Diagnosis Description 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 Quantity: 1 Electronic Signature(s) Signed: 05/23/2020 2:27:07 PM By: Baltazar Najjar MD Entered By: Baltazar Najjar on 05/22/2020 11:01:32

## 2020-05-29 ENCOUNTER — Other Ambulatory Visit: Payer: Self-pay | Admitting: Family Medicine

## 2020-06-01 ENCOUNTER — Ambulatory Visit (HOSPITAL_COMMUNITY)
Admission: RE | Admit: 2020-06-01 | Discharge: 2020-06-01 | Disposition: A | Discharge status: Home / Self Care | Payer: Medicare Other | Source: Ambulatory Visit | Attending: Internal Medicine | Admitting: Internal Medicine

## 2020-06-01 ENCOUNTER — Encounter (HOSPITAL_COMMUNITY): Payer: Self-pay

## 2020-06-01 ENCOUNTER — Other Ambulatory Visit: Payer: Self-pay

## 2020-06-01 DIAGNOSIS — L97519 Non-pressure chronic ulcer of other part of right foot with unspecified severity: Secondary | ICD-10-CM | POA: Diagnosis present

## 2020-06-01 MED ORDER — GADOBUTROL 1 MMOL/ML IV SOLN
6.0000 mL | Freq: Once | INTRAVENOUS | Status: AC | PRN
  Administered 2020-06-01: 6 mL via INTRAVENOUS

## 2020-06-02 ENCOUNTER — Encounter (HOSPITAL_BASED_OUTPATIENT_CLINIC_OR_DEPARTMENT_OTHER): Payer: Medicare Other | Attending: Internal Medicine | Admitting: Internal Medicine

## 2020-06-02 DIAGNOSIS — L89616 Pressure-induced deep tissue damage of right heel: Secondary | ICD-10-CM | POA: Diagnosis not present

## 2020-06-02 DIAGNOSIS — R532 Functional quadriplegia: Secondary | ICD-10-CM | POA: Insufficient documentation

## 2020-06-02 DIAGNOSIS — S90811A Abrasion, right foot, initial encounter: Secondary | ICD-10-CM | POA: Insufficient documentation

## 2020-06-02 DIAGNOSIS — L97312 Non-pressure chronic ulcer of right ankle with fat layer exposed: Secondary | ICD-10-CM | POA: Insufficient documentation

## 2020-06-02 DIAGNOSIS — L97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 layer exposed: Secondary | ICD-10-CM | POA: Diagnosis not present

## 2020-06-02 DIAGNOSIS — X58XXXA Exposure to other specified factors, initial encounter: Secondary | ICD-10-CM | POA: Insufficient documentation

## 2020-06-02 DIAGNOSIS — T8131XA Disruption of external operation (surgical) wound, not elsewhere classified, initial encounter: Secondary | ICD-10-CM | POA: Diagnosis not present

## 2020-06-02 DIAGNOSIS — L97322 Non-pressure chronic ulcer of left ankle with fat layer exposed: Secondary | ICD-10-CM | POA: Diagnosis not present

## 2020-06-02 DIAGNOSIS — Z8616 Personal history of COVID-19: Secondary | ICD-10-CM | POA: Insufficient documentation

## 2020-06-02 DIAGNOSIS — G809 Cerebral palsy, unspecified: Secondary | ICD-10-CM | POA: Insufficient documentation

## 2020-06-02 NOTE — Progress Notes (Signed)
COAL, NEARHOOD ([id number]) Visit Report for 06/02/2020 HPI Details Patient Name: Date of Service: [name], [name] 06/02/2020 9:45 A M Medical Record Number: [id number] Patient Account Number: [id number] Date of Birth/Sex: Treating RN: Oct 15, 1960 (60 y.o. [name] Primary Care Provider: [name] Other Clinician: Referring Provider: Treating Provider/Extender: [name], [name] in Treatment: 10 History of Present Illness HPI Description: 11/23/15; this is a patient I remember from a previous stay in this clinic in 2012. The patient says this was for a wound in this same area as currently although I have no information to verify that. Most of the information is provided by his caregiver from the group home where he resides. Apparently he has had a wound in this area for 8 months. He also has erythema around this area and has had multiple rounds of antibiotics apparently with some improvement but the erythema is persists. He apparently had a fracture in the ankle 2 months ago and was seen by Dr. Lajoyce Corners . He apparently had a splint applied and more recently a heel offloading boot. He had home health coming out to a week ago not clear what they are dressing this with but they apparently discharged him perhaps because of one of Dr. 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 He has a Nurse, adult for transferring at home he is nonambulatory and does not wear footwear. He is not a diabetic. Looking through [place] link he appears to have fairly active primary care. The area on the ankle is variably labeled as a pressure area and/or chronic venous insufficiency. He had a recent venous ultrasound on 5/25 that did not show a DVT in the left leg however this was not a reflux study. There was no superficial DVT either. Arterial studies on 10/15/14 showed a left ABI of 1.16 Center right of 1.12 waveforms were triphasic he does not have  significant arterial disease. He has had recent x-rays of the left foot and ankle. The left foot did not show any abnormality. Left ankle x-ray showed a spiral fracture of the left tibial diaphysis without evidence of osteomyelitis. 11/30/15 culture I did last week showed pseudomonas I changed him to oral ciprofloxacin. Erythema is a lot better. 12/07/15 area around the wound shows considerable improvement of the periwound erythema. 12/14/15; the patient has a improvement of the periwound erythema which in retrospect I think with cellulitis successfully treated. We have been using Iodoflex started last week 12/25/15 wound on the left medial malleolus and dorsal left foot. Theraskin #1 applied READMISSION 02/27/16; the patient was admitted to hospital from 8/14 through 01/18/16 initially felt to have cellulitis of his left lower leg however he was noted to have significant persistent pain and swelling. A duplex ultrasound was ordered that was negative. Obie Dredge an x-ray was done that showed a spiral fracture of the left leg. With posterior displacement and angulation at the mid left tibia. Orthopedics saw the patient and splinted the leg. He was then sent to [facility] skilled facility and only return to his group home last week. He has wounds on the left medial malleolus, left dorsal foot and a worrisome area on the plantar left heel which is not really open but may represent a hematoma or a DTI 03/06/16; areas of left anterior and medial ankle both look improved. There are 3 small wounds here. He has a area over the dorsal left first metatarsal head which is not open. In meantime his left  heel has opened 03/14/16 the medial left ankle wounds looks stable to improved. The area on the posterior left heel has opened up and had a very adherent eschar and slept over this 03/21/16 patient has 3 wounds on his left medial ankle is stable area on his left dorsal first metatarsal head appears to be improved.  Continued problems with adherent eschar over the left heel 03/28/16; patient has 3 wounds on his left medial ankle and lower leg all of these appear to be stable. The problem continues to be the left heel and the adherent eschar. This may have been a pressure ulcer from her brace at one point. He has a English as a second language teacher we have been using [employer] and all wound areas. Dressings are being changed by home health 04/04/16; the patient's areas on the medial aspect of the left ankle looks superficial and appear to be gradually closing. The area on the plantar aspects/Achilles aspect of his left heel as surface slough and nonviable subcutaneous tissue requiring debridement. 04/11/16; the areas on the patient's medial left ankle appear to be superficial and appear to be closing. The area on the plantar heel on the left has surface slough and nonviable subcutaneous tissue requiring debridement. I don't think this is too much different from last week there is also undermining 04/25/16; patient is followed in his group home by [facility] home health. The area on the plantar heel is smaller but with still with some depth. We're using Iodoflex to this. The areas on the left ankle looks healthy but dry and somewhat larger. Cause of this is not really clear using Prisma 05/02/16; areas x3 all look improved expecially the areas on the medial ankle. Heel wound is smaller but still deep 05/09/16; all areas 3 look improved on the medial ankle. Heel wound is also contracting. 12/0.9/17 the areas on his left medial ankle are all closed. I think these are largely chronic venous insufficiency however there is no edema. The area on his left heel was a pressure area. READMISSION 08/02/16; this man is a patient that we have had in this clinic on several times before. I believe he has cerebral palsy is nonambulatory. He has chronic venous insufficiency with venous inflammation. He was last year in the fall of 2017 with areas on his  left medial ankle left heel and left dorsal foot. He arrives today with once again a vague history. As mentioned he is nonambulatory he spends most of his day in a pressure offloading boot nevertheless they noted wounds in this area on the medial left ankle 2 weeks ago. There is any other history here I am uncertain 08/15/16; the patient's wounds on his medial ankle actually look better however the area on the left lateral/dorsal foot is covered in black necrotic eschar. Previous formal arterial studies did not show significant arterial disease with normal ABIs and triphasic waveforms. He is immobilized and at risk for pressure although he seems aware protective boots at all times as far as I am aware. 08/22/16; in general his wounds look better. All the wounds on his medial left ankle look improved the area on his lateral foot however continues to be problematic. Patient comes in the clinic today in a new wheelchair he is quite delighted 09/05/16; a wound on the left medial Calf and left dorsal foot are healed. The area on the left lateral foot is improved however the area on the left medial malleolus is inflamed with a considerable degree of surrounding erythema. This  looks like cellulitis. ABIs and clinical exam are within normal limits 09/12/16; the patient continues to have a wound on the left medial calf. I was concerned about cellulitis in this area last week and gave him a course of doxycycline. The wound looks better here. The area on the left lateral foot however is unchanged 09/20/16; the patient came in this week early after traumatizing his toe on the left foot on a table while moving around in his motorized wheelchair. 09/26/16- he is here for follow-up evaluation of his left foot and malleolus ulcers. Since his appointment he abraded his left medial hallux against a table while in his motorized wheelchair, apparently trying to pull up to the table for a meal. He continues to complain of  tenderness to the left lateral foot, surrounding the current ulcer 10/07/16; left foot and malleolus ulcers. 10/14/16; the patient is doing well. He has a wound on the left lateral, left medial malleolus and atraumatic wound on the left great toe. Although these appear to be superficial and closing 10/31/16; the patient is doing well. The area on the left lateral foot, left medial malleolus and the left great toe or all epithelialized. He has home health 11/14/16; patient arrives today with the area on the left great toe healed. The left lateral foot however in the left medial malleolus and the ankle superficial wounds that are slightly larger. He has erythema and tenderness around the area on the left lateral foot. As well he is complaining of dysuria. His caregiver notes that when he gets like this he is more lethargic which indeed seems to be the case today 11/21/16; left great toe is still healed. His wound on the left lateral foot appears healthy and slightly smaller also the area on the left medial ankle. This had black eschar on it which I removed. The remaining wound looks satisfactory 12/12/16; patient hasn't been here in a number of weeks. The area on the left lateral foot is healed however on his medial ankle has 2 open areas one of them covered with a necrotic eschar. He also has a new wound on the right second toe which was apparently trauma induced while he was at a movie 12/27/16; the patient has wounds on his left medial mallelus and left lateral foot that are just about closed. The area on the right second toe which was trauma from last time is still open but again everything looks a lot better here. 01/30/17; patient arrives in clinic today and is really been a long time since I've seen him. The wounds are all epithelialized on the left foot this includes his left lateral foot, left medial malleolus. Apparently was in the ER for what of I think was a fungal infection on his buttock's he was  prescribed Diflucan and doxycycline I think this is already getting better. READMISSION T is a now 60 year old man who has advanced cerebral palsy and functional quadriplegia. We have had him in this clinic multiple times before for wounds erry on his right ankle, left ankle, left dorsal foot and most recently from 08/02/16 through 01/30/17 with his left medial ankle and left foot and finally right second toe. His attendant from the group home where he lives is very knowledgeable about him. She states that they noticed breakdown in his skin in his feet sometime in January although they could not get an appointment until today in this clinic. The previous wounds have always been felt to be secondary to incidental trauma/pressure areas  or possibly some degree of chronic venous insufficiency. He had normal arterial studies in may of 2016. He has definite open wounds on the right lateral foot over the base of the fifth metatarsal, the right second toe DIP, the left medial malleolus. He has concerning areas over the left lateral fifth metatarsal base, retaform purpura over the medial left ankle and perhaps the medial left first toe. 07/21/17; culture we did last week from the base of the right fifth metatarsal grew group G strep. This should've been sensitive to the doxycycline I gave him. The other major wounds are on the right second toe DIP, left medial malleolus, base of the left lateral fifth metatarsal and the medial left ankle and medial left toe. 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 We have macrovascular arterial studies scheduled for Thursday although I don't think this is what the problem is. 07/28/17; patient arrives with really no improvement. He has a wound on the right second toe, lateral right foot, right medial malleolus which was new this week, left medial malleolus, but looked like a DTI on the left great toe. His macrovascular studies are on 07/30/17 although I would be  surprised if this provides all the explanation. He has small areas even on his right second and third toe which look like splinter-type hemorrhages. The question I have is where is this coming from. I'd like to see the vascular studies however before we pursue this. He has had a DVT in the past but is no longer on anticoagulants. I'll need to review his history 08/04/17; the patient had his ARTERIAL STUDIES this showed a right ABI of 1.16 and the left ABI of 1.20. TBI on the left at 0.84. He is at a right first toe amputation. The studies were unchanged from studies in May 2016. It was not felt that he had significant lower extremity arterial disease In the meantime we are using silver alginate to the wounds on the right second toe right lateral foot left medial malleolus. I'm going to change to Silver collagen today. 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 Working him up for this would include an echocardiogram probably a CT scan of the chest and abdomen to look at the major thoracic and abdominal aortic vessels. I am not going to involve myself in this unless there is more symptoms/signs 08/18/17; the patient arrives today with wounds looking somewhat better. The remaining areas are the right second toe dorsal, right lateral foot and left medial malleolus which only has a small open area. We've been using silver collagen 09/01/17;right second toe dorsally probes to bone today. This is a deterioration. We've been using silver collagen. Only a small open area remains on the left medial malleolus. 09/09/17; right second toe appears better. There is no probing bone. He has a small area remaining on the left medial malleolus, the right lateral foot. We have been using silver collagen 10/07/17; the patient returns to clinic today after a month hiatus. He has wounds over the right  second toe, right lateral foot and the left medial malleolus. We have been using silver collagen. He has home health. 10/24/17; patient has open wounds over the right second toe, now the left fourth toe and a superficial area over his left buttock. Some of the other areas have healed including the left medial malleolus and right lateral foot. Using silver collagen on these  wounds 11/07/17; the patient has closed his wounds on the right second toe and left fourth toe although they look all normal. Last time he was here he had a superficial area over the left buttock this is also closed. The right lateral foot wound I had closed out last time although it is seems to have reopened today. 628/19 the areas over the right second and left fourth toe remains closed. The right lateral foot has tightly adherent necrotic surface over. We have been using silver collagen he offloads this and a bunny boot 12/12/17; the patient has reopened the recurrent area over the right second PIP joint. 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 He does not have obvious macrovascular disease. The area over the right lateral foot looks about the same as last time tightly adherent necrotic debris. We've been using Hydrofera Blue 12/25/17; 2 week follow-up. The area over the right second PIP is closed however the area on the right lateral foot is worse with surrounding erythema and pain compatible with cellulitis is going to need an antibiotic. AS WELL..... We have looked at his buttocks and lower back. He has 3 stage II ulcers. The exact history here is unclear however he is very disabled man with cerebral palsy. He goes to a day program from the group home in which she lives 5 days a week and I think he is on his buttock all day on these visits. Surrounding the wounds see has extensive stage I injury as well. FINALLY it looks as though he has lost weight. I'm not the only  staff member that is recognized this 01/08/18 on evaluation today patient appears to be doing better in regard to his gluteal wounds. In fact everything appears to be healed back here although very dry. He has a couple open sores on the scrotal region other than this it is really his foot that is still giving him the most trouble. He did complete the Keflex unfortunately it seems like there's still a lot of erythema in this in fact looks worse than it did previous. I'm gonna culture this today and likely start with a different antibiotic. 01/16/18; I was reassured that the wounds on his buttocks and sacrum are healed. I did not look at these the day. The culture that was done last week on 01/11/18 of the right lateral foot wounds showed Pseudomonas. Bactrim is not a good choice for this. Ciprofloxacin interacts with his muscle relaxants therefore I prescribed cefdinir 300 twice a day for 7 days. Bactrim coverage of pseudomonas is not reliable although the degree of erythema looks a lot better 01/30/18; the patient has completed his antibiotics. The area does not have infection on the right lateral foot. The wounds look a lot better. We've been using silver alginate 02/12/18; there is on the right lateral foot. Copious amounts of surface eschar. Also similarly over the PIP of the right second toe. We've been using silver alginate to date 03/02/2018; the area that remains is on the right lateral foot. The right second toe is healed. 03/16/2018; the area there is original wound on the right lateral foot. Still requiring debridement. We are using Hydrofera Blue. The right second toe is still healed/closed 03/30/2018; right lateral foot there is 2 small open areas remaining. They actually look quite healthy we have been using Hydrofera Blue the right second toe remains closed 04/13/2018; right lateral foot. 1 of the 2 small open  areas from last time has closed over. The other had surface slough that I removed  with a #3 curette. This looks quite healthy. The right second toe remains closed. We are using Hydrofera Blue 05/01/2018; right lateral foot very superficial areas that look like they are trying to close over. He had a new threatened area on the left medial ankle just below the medial malleolus. I think these represent chronic venous insufficiency areas 05/14/2018; the right right lateral foot looks like it closed over as was predicted last time however he has some dark areas subcutaneously erythema superiorly and this is really very tender. This is either coexistent cellulitis or perhaps a deep tissue injury. The area on the left medial ankle is superficial but has some size. We have been using Hydrofera Blue to both areas. The patient complains of pain in his right foot 06/09/2018; right lateral foot was closed over last time but there was coexistent cellulitis around this. I gave him Levaquin. He also has an area on the left medial malleolus. He has not been back to clinic in over 3-1/2 weeks. We are using Hydrofera Blue I think to the left medial malleolus and silver alginate to the right lateral foot 1/21; right lateral foot still non-viable debris over this area and the left medial malleolus. Both of these vigorously washed off with antiseptic gauze. We have been using silver alginate. 2/4; right lateral foot this looks somewhat better. Unfortunately the area over the left medial malleolus has deteriorated. Using silver alginate in both area 2/18; both wounds arrived today covered in a lot of necrotic debris. We have been using silver alginate. Clearly not making a lot of progress. 3/3; the patient's right second toe is reopened over the PIP. I am not sure why this is happening. He may have microvascular disease. We have checked his macrovascular flow in the past and found it to be normal. He may need an x-ray. The area on the right lateral foot requires debridement. The area on the left lateral  ankle is worse. We have been using PolyMem Ag 08/26/18 on evaluation today patient appears to be doing a little worse in regard to his lower extremities based on what I'm seeing what the nursing staff is telling me. His left medial ankle appears potential to be infected there is your thing on want surrounding the wound. His right second toe is also of concern at this point in my pinion. We have not done x-rays of these areas that may be a good idea to go ahead and proceed with at this point to ensure will not deal with anything such as osteomyelitis. 4/16; it is been a while since I saw this patient. He now has wounds on the right lateral foot, dorsal right second toe, substantially on the left medial malleolus and a small area on the left lateral foot. We have been using PolyMem Silver. Curlex and Coban. He has home health changing the dressing. Since last time he was here he had a culture of the left medial ankle that showed MRSA. He is completed a course/7 days of doxycycline. XRAYS of the left ankle and right foot that were ordered were negative for osteomyelitis 4/30; he arrives in clinic today with some maceration around the wound and a lot of tenderness and erythema in a circumferential area around the right lateral foot. He has wound areas on the right lateral foot dorsal right second toe, largely over the left medial malleolus and a small area over  the left lateral foot 5/7; completed his antibiotics from last time right lateral foot cellulitis is better and the area on his left knee which we discovered after he had been seen by the provider also is improved. He still has extensive wound areas over the right lateral foot, left medial malleolus, right second toe and left lateral foot although all of these look somewhat better with PolyMem 5/15. Wounds on the right lateral foot, right second toe left medial malleolus and the left lateral foot. All of these look somewhat better. We have been  using PolyMem Ag 5/26; the wound on the right lateral foot is almost closed over but he still complains of a lot of pain in this area. He has a eschared area on the right second toe but no open wound.. Also concerning today his original wound on the left medial malleolus looks healthy however just inferior to this almost areas of deep tissue injury and there is an area on the left lateral foot also looks like a deep tissue injury. In the past I have wondered whether he might have cholesterol emboli or some other form of a vasculopathy. I wonder whether this could have something to do with this. 6/2; right lateral foot wound is still open I gave him empiric antibiotics last week without really a lot of change she is still complaining of pain in this area. We x- rayed his feet at some point in April these were negative. It would be difficult to do an MRI. The area on his left medial malleolus looks better. Right second toe is healed. He still has what looks to be a small pressure related area on the left lateral foot but it is not open. We are using PolyMem to all wound which seems to be doing nicely 6/16; right lateral foot wound is still open. The wound looks better although there is still a lot of tenderness around it. Rather than more empiric antibiotics I have cultured this today. We are using PolyMem Ag. On the left lateral malleolus the wound appears better. He has a new area on the left lower anterior pretibial area 6/29; 2-week follow-up. Culture I did of the right lateral foot 2 weeks ago showed MRSA. I gave him 2 weeks of Septra which he should be just about finishing now and this seems better. We got a call from home health last week to report swelling in the left leg because we were listed as his primary. We referred him back to primary care. He arrives today with intense erythema and tenderness around the 2 wounds on the left medial ankle on the left lower calf. The area on the right  lateral foot looks better 7/7; I brought him back this week to follow-up on his left foot which had cellulitis last week. I gave him clindamycin which as far as I know he tolerated. The erythema is better. He has a 3 current wounds one on the right lateral foot a small area on the right lateral malleolus and the more recent area on the left anterior pretibial area. 7/21; bilateral distal lower leg and foot wounds. The area on his right lateral foot looks a lot better. He has an area on his left pretibial area distally. The area on the medial malleolus on the left which is 1 of his worst wounds has closed over. He does not appear to have any open areas in his remaining toes 8/4-Patient returns at 2 weeks, he has the remaining wound on  the left lower leg the other 2 wounds have healed. Patient is continued to be treated with silver alginate with a 2 layer wrap on the left 8/18; I been following this patient for a long period for bilateral lower extremity wounds. 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 His wounds are probably pressure although he religiously wears thick bunny boots. He does not have macrovascular disease but I did formal testing on him perhaps 2 or 3 years ago. I have often wondered about either cholesterol emboli or a vasculopathy of some sort given the distal nature of these variable locations etc. He has 2 areas that we have been following one on the left medial lower leg and one on the right lateral foot. Both of these are mostly healed today there is some eschar over sections of both areas although I did not disturb this. 03/31/2019 on evaluation today patient appears to be doing a little bit more poorly in regard to the sacral region. He has 2 small wounds noted at this point. Fortunately there is no signs of active infection at this time. No fevers, chills, nausea, vomiting, or diarrhea. He is having some pain when he sits on  his gluteal region a big part of the reason he has these wounds is likely due to the fact that his wheelchair has not been functioning properly. He does still have a couple areas of deep tissue injury on his lower extremities he does need new Prevalon offloading boots as well. 04/28/2019 on evaluation today patient appears to be doing well with regard to his wounds in particular. His ankle area has fairly small based on what I am seeing currently and his sacral region actually is still open but does not appear to be doing too poorly in my opinion. Fortunately there is no signs of active infection at this time. He was in the hospital unfortunately and this was from 04/08/2019 through 04/12/2019 secondary to persistent diarrhea, dehydration and acute kidney injury. With that being said fortunately the patient does not seem to be doing too poorly at this time which is good news. He is having pain mainly in the sacral region. 06/23/2019 on evaluation today patient appears to be doing well with regard to his sacral region which in fact is completely healed. Unfortunately he has an area of rubbing/pressure on his right lateral chest/back region. This is where it is rubbing up on his chair. Subsequently he also has an open wound on the left medial malleolus which is not very open but starting to crack and then on the right lateral foot this is much more open at this point. Unfortunately there is no signs of significant improvement in regard to these areas and in fact there does appear to be evidence of active infection quite significantly. No fevers, chills, nausea, vomiting, or diarrhea. The patient did test positive for COVID-19 that has been greater than 14 days ago although they did not know the exact timing. That is why he was not here for his last appointment. 07/07/2019 upon evaluation today patient actually appears to be doing quite well in regard to his left ankle as well as the chest/back region which  is doing great in fact appears to be healed in my opinion. He still has an issue on his lateral right foot that is open and appears to be infected but does have me more concerned. In fact I do believe the doxycycline helped in general for the  cellulitis but I believe that may have been more MRSA related based on the culture that we did. What I am seeing right now has more of the blue-green drainage and may be more consistent with a Pseudomonas that I was hoping was not really causing much of an issue but I think it may be at this point on the right lateral foot. For that reason I do think treatment would be a good idea of the reason I avoided the Cipro before was the potential for QT prolongation which could obviously cause problems with the patient with results of interaction with respiratory wound. The tizanidine also had some interactions as well. Nonetheless I think at this point that it would be a possibility for [place] to use topical gentamicin to see if this could be of benefit for the patient underneath the silver alginate dressing. 07/21/2019 upon evaluation today patient appears to be doing fairly well at this point in regard to his lower extremity on the right. He does have a wound on his right lateral foot as well as the right fifth toe and the right fourth toe. Unfortunately he continues to experience significant issues with pressure and trauma to some degree and I think this is just due to the fact that again he is not really not able to control his legs and what is happening here. Fortunately there is no signs of active infection at this time. 08/04/2019 upon evaluation today patient actually appears to be doing quite well with regard to his toe ulcers I am very pleased in that regard. Unfortunately the foot ulcer is showing some signs of erythema his primary care provider has placed him on clindamycin at this point. Again I do believe this can be beneficial for him. With that being said I do  think the patient may benefit from a light Kerlix and Coban wrap to help with some of his edema at this point. 08/18/2019 upon evaluation today patient appears to be doing still poorly in regard to his lateral foot ulcer. This is not very deep but there is some concern about the possibility of osteomyelitis. I think it would be appropriate for [place] to go ahead and have an x-ray at this time. The patient is currently on clindamycin which from his culture which was performed on 06/23/2019 it does appear that he would benefit from that in regard to the MRSA. However Pseudomonas was also identified then and the patient actually has some blue-green drainage at this point as well. I am much more concerned about the possibility of the Pseudomonas being an infection is causative agent at this point that I am the MRSA. Subsequently Cipro the patient was sensitive to but we never use that is stable use a topical gent due to the fact that unfortunately there was some interaction between the Cipro and the patient's risk for down potentially causing QT prolongation. 08/31/2019; have not seen this patient in quite a period of time. He has a substantial wound on his right lateral foot we have been using gentamicin ointment with Hydrofera Blue over the top. From what I am able to determine he is also on antibiotics perhaps clindamycin although I have not had a chance to look over this. He also has an eschared area on the right fifth toe as well as the right second toe. 4/20; right lateral foot wound. He comes in today with intense erythema and tenderness around this wound. The wound itself has a surface on it but it doesn't  look particularly healthy a lot of gelatinous debris. There is no palpable bone. We've been using gentamicin ointment and Hydrofera Blue. Since the patient was last here he was seen by Dr. Algis Liming of infectious disease. He ordered an MRI that I think is due later this month on 4/30. He apparently also  checked a sedimentation rate and C-reactive protein metabolic panel CBC with differential. At the time he was seen he wanted to withhold any microbials pending identification of the depth of the infection although looking at this I don't know that I'm going to be able to hold off for another 10 days. 4/29; he went on to have the MRI that was ordered by Dr. Algis Liming of infectious disease. 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 I had given him cefdinir last week for surrounding cellulitis he is finishing this today. I have communicated with Dr. Algis Liming who wants bone for pathology and culture before proceeding with consideration of IV antibiotics. Fortunately he is due to see Dr. Samuella Cota of podiatry today I am hopeful that the bone biopsy and culture will be considered there 5/4 as I understand things Dr. Samuella Cota actually wants to remove the diseased proximal fifth metatarsal. Looking back at this decision is probably not a bad one. He has good blood flow he is nonambulatory they are apparently going to do this next week. Paradoxically the wound on the lateral part of his foot is actually a lot better. The cellulitis that was so problematic 2 weeks ago also has resolved. READMISSION 03/23/2020 T is now a 61 year old man he has cerebral palsy and is functionally paraplegic. The last time I saw him he had a diseased proximal fifth metatarsal. 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 Samuella Cota. Culture of this showed Pseudomonas and he was followed by infectious disease Dr. Algis Liming treated with ciprofloxacin. 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 He had Integra placed. A CandS showed Pseudomonas again. Since then he has had Silvadene as a primary dressing, then Santyl and more recently Xeroform and then back to Santyl 3 times a  week per his attendant who comes with him. Reviewing his records I was really not prepared for the number of wounds that he currently has. This includes substantial areas on the right lateral foot, right lateral ankle, right fourth toe dorsally, right ankle anteriorly as well as the left lateral ankle and foot. His medical history is unchanged ABIs 1.2 on the right and 1.21 on the left he has not been known to have arterial insufficiency and is not a diabetic 11/5; patient I readmitted to the clinic last week. He has multiple wounds on the right lateral foot, right fourth toe and a new area on the right second toe. He has areas on the right dorsal ankle and a substantive wound over the left lateral ankle and foot. We are using silver collagen. We put him under compression last week and this seems to have helped. He has home health coming out one other time per week to change the dressing 11/12; no real improvement. Multiple bilateral foot and ankle wounds. On the left he has a substantial wound on the left lateral ankle On the right he has wounds on the dorsal second and fourth toes. Right lateral foot we have merged to wounds these are necrotic She  has an area on the right dorsal ankle and a area on the right medial malleolus with marked surrounding erythema likely cellulitis. The patient complains about pain that kept him awake all last night from wraps that were too tight. He is not known to have arterial insufficiency. 11/18; no real change. I gave him empiric Levaquin last week directed at previously cultured Pseudomonas this is not really helped. On the right he has wounds on the right lateral foot right lateral ankle right medial ankle and the dorsal second and fourth toes. Most of these do not have a nice have a healthy surface. There is erythema in his foot which may all be venous stasis. The area on the left lateral malleolus/ankle area looks better 12/3; really disappointing if anything  deterioration especially on the right lateral foot. These are necrotic. He also has areas on the right dorsal ankle right medial malleolus and the right second and fourth toes. On the left he has a large area over the left medial ankle. There is some erythema around the wound. Things are very tender I have reviewed his most recent arterial Dopplers which are actually in 2019 but he has never been felt to have arterial issues. At that time his ABI on the right was 1.16 [no TBI due to amputation] on the left his ABI was 1.20 with a great toe pressure of 0.24. Waveforms on the right and left toes were all normal he was not felt to have significant evidence of lower extremity arterial disease. ABIs in our clinic have always been normal his pulses have been palpable. He had an MRI of his right foot in April 2021 this showed a soft tissue ulceration along the lateral midfoot with underlying osteomyelitis no abscess. 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 He returned to our clinic on 03/23/2020. The same pattern of wounds that I am describing currently this was a lot worse than earlier in the year. I gave him empiric antibiotics because he had had Pseudomonas in the past this does not seem to have helped. We have not made any progress on any of these wound site. The patient is in a lot of pain 12/10; the patient comes in with a new deep tissue injury on the right heel large necrotic wound on the right lateral foot, smaller necrotic wound on the dorsal ankle and medial ankle. Necrotic wounds on the second and fourth toes. We have been using Hydrofera Blue On the left he has the area on the left little medial ankle which actually looks some better healthier looking tissue. We are using Hydrofera Blue here as well X-ray ordered last week showed no osseous erosion no plain film indication of osteomyelitis. 12/17; PCR culture I did of the right lateral foot wound last week showed a large titer of  Enterococcus faecalis and low titers of of coag negative staph and Peptostreptococcus. I have started him on Augmentin for 2 weeks but I may continue beyond this. He has continuous complaints of pain now apparently making it difficult for him to rest at night. Substantial wound area in the right foot and ankle. Unfortunately the left medial foot seems to be more substantial this week. 12/27; in general the wounds on the right foot and ankle and the left medial ankle look better. There is certainly less erythema in the right foot. I have him on Augmentin and I am going to extend this for another week. MRI is booked for 06/01/2020 06/02/2020; patient's  extensive skin breakdown on the right foot continues it is stable to perhaps slightly improved however there is a considerable amount of tissue breakdown. Unfortunately his MRI shows findings consistent with osteomyelitis in the lateral aspect of the cuboid. The overlying wound in this area is deep but less necrotic than I am used to seeing. He has area on the right posterior heel is now necrotic.. The area on the left lateral ankle/foot looks somewhat better. The patient states he continues to be in a lot of pain from the right foot and he is really tired of hurting from these wounds. I have discussed things with T before. He is anxious to be out of pain. He is nonambulatory. Furthermore the osteomyelitis in the right foot and the erry extensive wounds in this area are only a source of potential systemic infection. He seems in favor of proceeding with a below-knee amputation. I cannot argue with this we have not made a lot of progress in several months working on these wounds ever since his fifth ray amputation by Dr. Samuella Cota Electronic Signature(s) Signed: 06/02/2020 4:48:18 PM By: Baltazar Najjar MD Entered By: Baltazar Najjar on 06/02/2020 12:45:40 -------------------------------------------------------------------------------- Physical Exam  Details Patient Name: Date of Service: [name], [name] 06/02/2020 9:45 A M Medical Record Number: [id number] Patient Account Number: [id number] Date of Birth/Sex: Treating RN: 07-21-1960 (60 y.o. [name] Primary Care Provider: [name] Other Clinician: Referring Provider: Treating Provider/Extender: [name], [name] in Treatment: 10 Constitutional Sitting or standing Blood Pressure is within target range for patient.. Pulse regular and within target range for patient.Marland Kitchen Respirations regular, non-labored and within target range.. Temperature is normal and within the target range for the patient.Marland Kitchen Appears in no distress. Cardiovascular Dorsalis pedis pulses are palpable on the right foot. Edema is well controlled. Notes Wound exam On the left side the wound over the ankle looks generally healthy we have been using Iodoflex On the right medial malleolus generally clean looking wound no debridement. On the right posterior heel necrotic eschar I did not debride this. On the right lateral foot the wound is extensive but with a better looking surface than what he has had in the last few weeks. He has wounds on the second and fourth toe which also seem better Electronic Signature(s) Signed: 06/02/2020 4:48:18 PM By: Baltazar Najjar MD Entered By: Baltazar Najjar on 06/02/2020 12:51:52 -------------------------------------------------------------------------------- Physician Orders Details Patient Name: Date of Service: [name], [name] 06/02/2020 9:45 A M Medical Record Number: [id number] Patient Account Number: [id number] Date of Birth/Sex: Treating RN: [birth year] (60 y.o. 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 Bathing/ Shower/ Hygiene May shower with protection but do not get wound dressing(s) wet. Edema Control - Lymphedema / SCD / Other Bilateral Lower Extremities Elevate legs to the level of the heart or above for 30 minutes daily and/or when sitting, a frequency of: Off-Loading Heel suspension boot to: -  heel protectors at all times Other: - avoid any pressure to back of heels Home Health No change in wound care orders this week; continue Home Health for wound care. May utilize formulary equivalent dressing for wound treatment orders unless otherwise specified. Other Home Health Orders/Instructions: - [facility] Wound Treatment Wound #41 - T Fourth oe Wound Laterality: Right Cleanser: Wound Cleanser (Home Health) 2 x Per Week/30 Days Discharge Instructions: Cleanse the wound with wound cleanser prior to applying a clean dressing using gauze sponges, not tissue or cotton balls. Peri-Wound Care: Sween Lotion (Moisturizing lotion) 2 x Per Week/30 Days Discharge Instructions: Apply moisturizing lotion with dressing changes Topical: Bactroban (Home Health) 2 x Per Week/30 Days Discharge Instructions: apply to wound bed under alginate Prim Dressing: KerraCel Ag Gelling Fiber Dressing, 4x5 in (silver alginate) (Home Health) 2 x Per Week/30 Days ary Discharge Instructions: Apply silver alginate to wound bed as instructed Secondary Dressing: Woven Gauze Sponge, Non-Sterile 4x4 in  (Home Health) 2 x Per Week/30 Days Discharge Instructions: Apply over primary dressing as directed. Secondary Dressing: ABD Pad, 5x9 (Home Health) 2 x Per Week/30 Days Discharge Instructions: Apply over primary dressing as directed. Compression Wrap: Kerlix Roll 4.5x3.1 (in/yd) (Home Health) 2 x Per Week/30 Days Discharge Instructions: Apply Kerlix and Coban compression as directed. Compression Wrap: Coban Self-Adherent Wrap 4x5 (in/yd) (Home Health) 2 x Per Week/30 Days Discharge Instructions: Apply over Kerlix as directed. Wound #42 - Malleolus Wound Laterality: Right, Medial Cleanser: Wound Cleanser (Home Health) 2 x Per Week/30 Days Discharge Instructions: Cleanse the wound with wound cleanser prior to applying a clean dressing using gauze sponges, not tissue or cotton balls. Peri-Wound Care: Sween Lotion (Moisturizing lotion) 2 x Per Week/30 Days Discharge Instructions: Apply moisturizing lotion with dressing changes Topical: Bactroban (Home Health) 2 x Per Week/30 Days Discharge Instructions: apply to wound bed under alginate Prim Dressing: KerraCel Ag Gelling Fiber Dressing, 4x5 in (silver alginate) (Home Health) 2 x Per Week/30 Days ary Discharge Instructions: Apply silver alginate to wound bed as instructed Secondary Dressing: Woven Gauze Sponge, Non-Sterile 4x4 in (Home Health) 2 x Per Week/30 Days Discharge Instructions: Apply over primary dressing as directed. Secondary Dressing: ABD Pad, 5x9 (Home Health) 2 x Per Week/30 Days Discharge Instructions: Apply over primary dressing as directed. Compression Wrap: Kerlix Roll 4.5x3.1 (in/yd) (Home Health) 2 x Per Week/30 Days Discharge Instructions: Apply Kerlix and Coban compression as directed. Compression Wrap: Coban Self-Adherent Wrap 4x5 (in/yd) (Home Health) 2 x Per Week/30 Days Discharge Instructions: Apply over Kerlix as directed. Wound #43 - Lower Leg Wound Laterality: Right, Anterior, Distal Cleanser: Wound Cleanser (Home  Health) 2 x Per Week/30 Days Discharge Instructions: Cleanse the wound with wound cleanser prior to applying a clean dressing using gauze sponges, not tissue or cotton balls. Peri-Wound Care: Sween Lotion (Moisturizing lotion) 2 x Per Week/30 Days Discharge Instructions: Apply moisturizing lotion with dressing changes Topical: Bactroban (Home Health) 2 x Per Week/30 Days Discharge Instructions: apply to wound bed under alginate Prim Dressing: KerraCel Ag Gelling Fiber Dressing, 4x5 in (silver alginate) (Home Health) 2 x Per Week/30 Days ary Discharge Instructions: Apply silver alginate to wound bed as instructed Secondary Dressing: Woven Gauze Sponge, Non-Sterile 4x4 in (Home Health) 2 x Per Week/30 Days Discharge Instructions: Apply over primary dressing as directed. Secondary Dressing: ABD Pad, 5x9 (Home Health) 2 x Per Week/30 Days Discharge Instructions: Apply over primary dressing as directed. Compression Wrap: Kerlix Roll 4.5x3.1 (in/yd) (Home Health) 2 x Per Week/30 Days Discharge Instructions:  Apply Kerlix and Coban compression as directed. Compression Wrap: Coban Self-Adherent Wrap 4x5 (in/yd) (Home Health) 2 x Per Week/30 Days Discharge Instructions: Apply over Kerlix as directed. Wound #44 - Foot Wound Laterality: Right, Lateral, Proximal Cleanser: Wound Cleanser (Home Health) 2 x Per Week/30 Days Discharge Instructions: Cleanse the wound with wound cleanser prior to applying a clean dressing using gauze sponges, not tissue or cotton balls. Peri-Wound Care: Sween Lotion (Moisturizing lotion) 2 x Per Week/30 Days Discharge Instructions: Apply moisturizing lotion with dressing changes Topical: Bactroban (Home Health) 2 x Per Week/30 Days Discharge Instructions: apply to wound bed under alginate Prim Dressing: KerraCel Ag Gelling Fiber Dressing, 4x5 in (silver alginate) (Home Health) 2 x Per Week/30 Days ary Discharge Instructions: Apply silver alginate to wound bed as  instructed Secondary Dressing: Woven Gauze Sponge, Non-Sterile 4x4 in (Home Health) 2 x Per Week/30 Days Discharge Instructions: Apply over primary dressing as directed. Secondary Dressing: ABD Pad, 5x9 (Home Health) 2 x Per Week/30 Days Discharge Instructions: Apply over primary dressing as directed. Compression Wrap: Kerlix Roll 4.5x3.1 (in/yd) (Home Health) 2 x Per Week/30 Days Discharge Instructions: Apply Kerlix and Coban compression as directed. Compression Wrap: Coban Self-Adherent Wrap 4x5 (in/yd) (Home Health) 2 x Per Week/30 Days Discharge Instructions: Apply over Kerlix as directed. Wound #46 - T Second oe Wound Laterality: Right Cleanser: Wound Cleanser (Home Health) 2 x Per Week/30 Days Discharge Instructions: Cleanse the wound with wound cleanser prior to applying a clean dressing using gauze sponges, not tissue or cotton balls. Peri-Wound Care: Sween Lotion (Moisturizing lotion) 2 x Per Week/30 Days Discharge Instructions: Apply moisturizing lotion with dressing changes Topical: Bactroban (Home Health) 2 x Per Week/30 Days Discharge Instructions: apply to wound bed under alginate Prim Dressing: KerraCel Ag Gelling Fiber Dressing, 4x5 in (silver alginate) (Home Health) 2 x Per Week/30 Days ary Discharge Instructions: Apply silver alginate to wound bed as instructed Secondary Dressing: Woven Gauze Sponge, Non-Sterile 4x4 in (Home Health) 2 x Per Week/30 Days Discharge Instructions: Apply over primary dressing as directed. Secondary Dressing: ABD Pad, 5x9 (Home Health) 2 x Per Week/30 Days Discharge Instructions: Apply over primary dressing as directed. Compression Wrap: Kerlix Roll 4.5x3.1 (in/yd) (Home Health) 2 x Per Week/30 Days Discharge Instructions: Apply Kerlix and Coban compression as directed. Compression Wrap: Coban Self-Adherent Wrap 4x5 (in/yd) (Home Health) 2 x Per Week/30 Days Discharge Instructions: Apply over Kerlix as directed. Wound #47 - Malleolus Wound  Laterality: Left, Medial Cleanser: Wound Cleanser (Home Health) 2 x Per Week/30 Days Discharge Instructions: Cleanse the wound with wound cleanser prior to applying a clean dressing using gauze sponges, not tissue or cotton balls. Peri-Wound Care: Sween Lotion (Moisturizing lotion) (Home Health) 2 x Per Week/30 Days Discharge Instructions: Apply moisturizing lotion with dressing changes Prim Dressing: Hydrofera Blue Classic Foam, 4x4 in (Home Health) 2 x Per Week/30 Days ary Discharge Instructions: Apply to wound bed as instructed Secondary Dressing: ABD Pad, 5x9 (Home Health) 2 x Per Week/30 Days Discharge Instructions: Apply over primary dressing as directed. Compression Wrap: Kerlix Roll 4.5x3.1 (in/yd) (Home Health) 2 x Per Week/30 Days Discharge Instructions: Apply Kerlix and Coban compression , be sure to wrap from base of toes to tibial tuberosity. no too tight Compression Wrap: Coban Self-Adherent Wrap 4x5 (in/yd) (Home Health) 2 x Per Week/30 Days Discharge Instructions: Apply over Kerlix as directed. Wound #48 - Calcaneus Wound Laterality: Right Cleanser: Wound Cleanser (Home Health) 2 x Per Week/30 Days Discharge Instructions: Cleanse the wound with wound cleanser  prior to applying a clean dressing using gauze sponges, not tissue or cotton balls. Peri-Wound Care: Sween Lotion (Moisturizing lotion) 2 x Per Week/30 Days Discharge Instructions: Apply moisturizing lotion with dressing changes Topical: Bactroban (Home Health) 2 x Per Week/30 Days Discharge Instructions: apply to wound bed under alginate Prim Dressing: KerraCel Ag Gelling Fiber Dressing, 4x5 in (silver alginate) (Home Health) 2 x Per Week/30 Days ary Discharge Instructions: Apply silver alginate to wound bed as instructed Secondary Dressing: Woven Gauze Sponge, Non-Sterile 4x4 in (Home Health) 2 x Per Week/30 Days Discharge Instructions: Apply over primary dressing as directed. Secondary Dressing: ABD Pad, 5x9 (Home  Health) 2 x Per Week/30 Days Discharge Instructions: Apply over primary dressing as directed. Secondary Dressing: ALLEVYN Heel 4 1/2in x 5 1/2in / 10.5cm x 13.5cm (Home Health) 2 x Per Week/30 Days Discharge Instructions: Apply over primary dressing as directed. Compression Wrap: Kerlix Roll 4.5x3.1 (in/yd) (Home Health) 2 x Per Week/30 Days Discharge Instructions: Apply Kerlix and Coban compression as directed. Compression Wrap: Coban Self-Adherent Wrap 4x5 (in/yd) (Home Health) 2 x Per Week/30 Days Discharge Instructions: Apply over Kerlix as directed. Consults Orthopedic - Dr. 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 knee amputation - (ICD10 T81.31XD - Disruption of external operation (surgical) wound, not elsewhere classified, subsequent encounter) Electronic Signature(s) Signed: 06/02/2020 4:46:42 PM By: Zenaida Deed RN, BSN Signed: 06/02/2020 4:48:18 PM By: Baltazar Najjar MD Entered By: Zenaida Deed on 06/02/2020 11:30:57 Prescription 06/02/2020 -------------------------------------------------------------------------------- Madie Reno MD Patient Name: Provider: 1960/12/25 [id number] Date of Birth: NPI#: [name] [id number] Sex: DEA #: [phone] [id number] Phone #: License #: Eligha Bridegroom [facility] Patient Address: [address] [place], [place] [address] [address] [place], [place] [address] [phone] Allergies adhesive tape Provider's Orders Orthopedic - ICD10: T81.31XD - Dr. 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 [name] Primary Care Provider: [name] Other Clinician: Referring Provider: Treating Provider/Extender: [name], [name] in Treatment: 10 Active Problems ICD-10 Encounter Code Description Active Date MDM Diagnosis T81.31XD Disruption of external operation (surgical) wound, not elsewhere classified, 03/23/2020 No Yes subsequent encounter S90.811D Abrasion, right foot, subsequent encounter 03/23/2020 No Yes L97.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 layer exposed 03/23/2020 No Yes L97.312 Non-pressure chronic ulcer of right ankle with fat layer exposed 03/23/2020 No Yes L97.322 Non-pressure chronic ulcer of left ankle with fat layer exposed 03/23/2020 No Yes L89.616 Pressure-induced deep tissue damage of right heel 05/05/2020 No Yes Inactive Problems ICD-10 Code Description Active Date Inactive Date L03.115 Cellulitis of right lower limb 04/07/2020 04/07/2020 I89.0 Lymphedema, not elsewhere classified 03/23/2020 03/23/2020 Resolved Problems Electronic Signature(s) Signed: 06/02/2020 4:48:18 PM By: Baltazar Najjar MD Entered By: Baltazar Najjar on 06/02/2020 12:35:35 -------------------------------------------------------------------------------- Progress Note Details Patient Name: Date of Service: [name]. 06/02/2020 9:45 A M Medical Record Number: [id number] Patient Account Number: [id number] Date of Birth/Sex: Treating RN: March 13, 1961 (60 y.o. [name] Primary Care Provider: [name] Other Clinician: Referring  Provider: Treating Provider/Extender: [name], [name] in  Treatment: 10 Subjective History of Present Illness (HPI) 11/23/15; this is a patient I remember from a previous stay in this clinic in 2012. The patient says this was for a wound in this same area as currently although I have no information to verify that. Most of the information is provided by his caregiver from the group home where he resides. Apparently he has had a wound in this area for 8 months. He also has erythema around this area and has had multiple rounds of antibiotics apparently with some improvement but the erythema is persists. He apparently had a fracture in the ankle 2 months ago and was seen by Dr. Lajoyce Corners . He apparently had a splint applied and more recently a heel offloading boot. He had home health coming out to a week ago not clear what they are dressing this with but they apparently discharged him perhaps because of one of Dr. 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 He has a Nurse, adult for transferring at home he is nonambulatory and does not wear footwear. He is not a diabetic. Looking through [facility] he appears to have fairly active primary care. The area on the ankle is variably labeled as a pressure area and/or chronic venous insufficiency. He had a recent venous ultrasound on 5/25 that did not show a DVT in the left leg however this was not a reflux study. There was no superficial DVT either. Arterial studies on 10/15/14 showed a left ABI of 1.16 Center right of 1.12 waveforms were triphasic he does not have significant arterial disease. He has had recent x-rays of the left foot and ankle. The left foot did not show any abnormality. Left ankle x-ray showed a spiral fracture of the left tibial diaphysis without evidence of osteomyelitis. 11/30/15 culture I did last week showed pseudomonas I changed him to oral ciprofloxacin. Erythema is a lot better. 12/07/15 area around the wound shows considerable improvement of the periwound  erythema. 12/14/15; the patient has a improvement of the periwound erythema which in retrospect I think with cellulitis successfully treated. We have been using Iodoflex started last week 12/25/15 wound on the left medial malleolus and dorsal left foot. Theraskin #1 applied READMISSION 02/27/16; the patient was admitted to hospital from 8/14 through 01/18/16 initially felt to have cellulitis of his left lower leg however he was noted to have significant persistent pain and swelling. A duplex ultrasound was ordered that was negative. Obie Dredge an x-ray was done that showed a spiral fracture of the left leg. With posterior displacement and angulation at the mid left tibia. Orthopedics saw the patient and splinted the leg. He was then sent to [facility] skilled facility and only return to his group home last week. He has wounds on the left medial malleolus, left dorsal foot and a worrisome area on the plantar left heel which is not really open but may represent a hematoma or a DTI 03/06/16; areas of left anterior and medial ankle both look improved. There are 3 small wounds here. He has a area over the dorsal left first metatarsal head which is not open. In meantime his left heel has opened 03/14/16 the medial left ankle wounds looks stable to improved. The area on the posterior left heel has opened up and had a very adherent eschar and slept over this 03/21/16 patient has 3 wounds on his left medial ankle is stable area on his  left dorsal first metatarsal head appears to be improved. Continued problems with adherent eschar over the left heel 03/28/16; patient has 3 wounds on his left medial ankle and lower leg all of these appear to be stable. The problem continues to be the left heel and the adherent eschar. This may have been a pressure ulcer from her brace at one point. He has a English as a second language teacherbunny boot we have been using [employer] and all wound areas. Dressings are being changed by home health 04/04/16; the  patient's areas on the medial aspect of the left ankle looks superficial and appear to be gradually closing. The area on the plantar aspects/Achilles aspect of his left heel as surface slough and nonviable subcutaneous tissue requiring debridement. 04/11/16; the areas on the patient's medial left ankle appear to be superficial and appear to be closing. The area on the plantar heel on the left has surface slough and nonviable subcutaneous tissue requiring debridement. I don't think this is too much different from last week there is also undermining 04/25/16; patient is [facility]. The area on the plantar heel is smaller but with still with some depth. We're using Iodoflex to this. The areas on the left ankle looks healthy but dry and somewhat larger. Cause of this is not really clear using Prisma 05/02/16; areas x3 all look improved expecially the areas on the medial ankle. Heel wound is smaller but still deep 05/09/16; all areas o3 look improved on the medial ankle. Heel wound is also contracting. 12/0.9/17 the areas on his left medial ankle are all closed. I think these are largely chronic venous insufficiency however there is no edema. The area on his left heel was a pressure area. READMISSION 08/02/16; this man is a patient that we have had in this clinic on several times before. I believe he has cerebral palsy is nonambulatory. He has chronic venous insufficiency with venous inflammation. He was last year in the fall of 2017 with areas on his left medial ankle left heel and left dorsal foot. He arrives today with once again a vague history. As mentioned he is nonambulatory he spends most of his day in a pressure offloading boot nevertheless they noted wounds in this area on the medial left ankle 2 weeks ago. There is any other history here I am uncertain 08/15/16; the patient's wounds on his medial ankle actually look better however the area on the left  lateral/dorsal foot is covered in black necrotic eschar. Previous formal arterial studies did not show significant arterial disease with normal ABIs and triphasic waveforms. He is immobilized and at risk for pressure although he seems aware protective boots at all times as far as I am aware. 08/22/16; in general his wounds look better. All the wounds on his medial left ankle look improved the area on his lateral foot however continues to be problematic. Patient comes in the clinic today in a new wheelchair he is quite delighted 09/05/16; a wound on the left medial Calf and left dorsal foot are healed. The area on the left lateral foot is improved however the area on the left medial malleolus is inflamed with a considerable degree of surrounding erythema. This looks like cellulitis. ABIs and clinical exam are within normal limits 09/12/16; the patient continues to have a wound on the left medial calf. I was concerned about cellulitis in this area last week and gave him a course of doxycycline. The wound looks better here. The area  on the left lateral foot however is unchanged 09/20/16; the patient came in this week early after traumatizing his toe on the left foot on a table while moving around in his motorized wheelchair. 09/26/16- he is here for follow-up evaluation of his left foot and malleolus ulcers. Since his appointment he abraded his left medial hallux against a table while in his motorized wheelchair, apparently trying to pull up to the table for a meal. He continues to complain of tenderness to the left lateral foot, surrounding the current ulcer 10/07/16; left foot and malleolus ulcers. 10/14/16; the patient is doing well. He has a wound on the left lateral, left medial malleolus and atraumatic wound on the left great toe. Although these appear to be superficial and closing 10/31/16; the patient is doing well. The area on the left lateral foot, left medial malleolus and the left great toe or all  epithelialized. He has home health 11/14/16; patient arrives today with the area on the left great toe healed. The left lateral foot however in the left medial malleolus and the ankle superficial wounds that are slightly larger. He has erythema and tenderness around the area on the left lateral foot. As well he is complaining of dysuria. His caregiver notes that when he gets like this he is more lethargic which indeed seems to be the case today 11/21/16; left great toe is still healed. His wound on the left lateral foot appears healthy and slightly smaller also the area on the left medial ankle. This had black eschar on it which I removed. The remaining wound looks satisfactory 12/12/16; patient hasn't been here in a number of weeks. The area on the left lateral foot is healed however on his medial ankle has 2 open areas one of them covered with a necrotic eschar. He also has a new wound on the right second toe which was apparently trauma induced while he was at a movie 12/27/16; the patient has wounds on his left medial mallelus and left lateral foot that are just about closed. The area on the right second toe which was trauma from last time is still open but again everything looks a lot better here. 01/30/17; patient arrives in clinic today and is really been a long time since I've seen him. The wounds are all epithelialized on the left foot this includes his left lateral foot, left medial malleolus. Apparently was in the ER for what of I think was a fungal infection on his buttock's he was prescribed Diflucan and doxycycline I think this is already getting better. READMISSION T is a now 60 year old man who has advanced cerebral palsy and functional quadriplegia. We have had him in this clinic multiple times before for wounds erry on his right ankle, left ankle, left dorsal foot and most recently from 08/02/16 through 01/30/17 with his left medial ankle and left foot and finally right second toe. His  attendant from the group home where he lives is very knowledgeable about him. She states that they noticed breakdown in his skin in his feet sometime in January although they could not get an appointment until today in this clinic. The previous wounds have always been felt to be secondary to incidental trauma/pressure areas or possibly some degree of chronic venous insufficiency. He had normal arterial studies in may of 2016. He has definite open wounds on the right lateral foot over the base of the fifth metatarsal, the right second toe DIP, the left medial malleolus. He has concerning areas over  the left lateral fifth metatarsal base, retaform purpura over the medial left ankle and perhaps the medial left first toe. 07/21/17; culture we did last week from the base of the right fifth metatarsal grew group G strep. This should've been sensitive to the doxycycline I gave him. The other major wounds are on the right second toe DIP, left medial malleolus, base of the left lateral fifth metatarsal and the medial left ankle and medial left toe. 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 We have macrovascular arterial studies scheduled for Thursday although I don't think this is what the problem is. 07/28/17; patient arrives with really no improvement. He has a wound on the right second toe, lateral right foot, right medial malleolus which was new this week, left medial malleolus, but looked like a DTI on the left great toe. His macrovascular studies are on 07/30/17 although I would be surprised if this provides all the explanation. He has small areas even on his right second and third toe which look like splinter-type hemorrhages. The question I have is where is this coming from. I'd like to see the vascular studies however before we pursue this. He has had a DVT in the past but is no longer on anticoagulants. I'll need to review his history 08/04/17; the patient had his ARTERIAL STUDIES this showed  a right ABI of 1.16 and the left ABI of 1.20. TBI on the left at 0.84. He is at a right first toe amputation. The studies were unchanged from studies in May 2016. It was not felt that he had significant lower extremity arterial disease In the meantime we are using silver alginate to the wounds on the right second toe right lateral foot left medial malleolus. I'm going to change to Silver collagen today. 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 Working him up for this would include an echocardiogram probably a CT scan of the chest and abdomen to look at the major thoracic and abdominal aortic vessels. I am not going to involve myself in this unless there is more symptoms/signs 08/18/17; the patient arrives today with wounds looking somewhat better. The remaining areas are the right second toe dorsal, right lateral foot and left medial malleolus which only has a small open area. We've been using silver collagen 09/01/17;right second toe dorsally probes to bone today. This is a deterioration. We've been using silver collagen. Only a small open area remains on the left medial malleolus. 09/09/17; right second toe appears better. There is no probing bone. He has a small area remaining on the left medial malleolus, the right lateral foot. We have been using silver collagen 10/07/17; the patient returns to clinic today after a month hiatus. He has wounds over the right second toe, right lateral foot and the left medial malleolus. We have been using silver collagen. He has home health. 10/24/17; patient has open wounds over the right second toe, now the left fourth toe and a superficial area over his left buttock. Some of the other areas have healed including the left medial malleolus and right lateral foot. Using silver collagen on these wounds 11/07/17; the patient has closed his  wounds on the right second toe and left fourth toe although they look all normal. Last time he was here he had a superficial area over the left buttock this is also closed. The right lateral foot wound I had closed  out last time although it is seems to have reopened today. 628/19 the areas over the right second and left fourth toe remains closed. The right lateral foot has tightly adherent necrotic surface over. We have been using silver collagen he offloads this and a bunny boot 12/12/17; the patient has reopened the recurrent area over the right second PIP joint. 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 He does not have obvious macrovascular disease. The area over the right lateral foot looks about the same as last time tightly adherent necrotic debris. We've been using Hydrofera Blue 12/25/17; 2 week follow-up. The area over the right second PIP is closed however the area on the right lateral foot is worse with surrounding erythema and pain compatible with cellulitis is going to need an antibiotic. ooAS WELL..... We have looked at his buttocks and lower back. He has 3 stage II ulcers. The exact history here is unclear however he is very disabled man with cerebral palsy. He goes to a day program from the group home in which she lives 5 days a week and I think he is on his buttock all day on these visits. Surrounding the wounds see has extensive stage I injury as well. ooFINALLY it looks as though he has lost weight. I'm not the only staff member that is recognized this 01/08/18 on evaluation today patient appears to be doing better in regard to his gluteal wounds. In fact everything appears to be healed back here although very dry. He has a couple open sores on the scrotal region other than this it is really his foot that is still giving him the most trouble. He did complete the Keflex unfortunately it seems like there's still a lot of  erythema in this in fact looks worse than it did previous. I'm gonna culture this today and likely start with a different antibiotic. 01/16/18; I was reassured that the wounds on his buttocks and sacrum are healed. I did not look at these the day. The culture that was done last week on 01/11/18 of the right lateral foot wounds showed Pseudomonas. Bactrim is not a good choice for this. Ciprofloxacin interacts with his muscle relaxants therefore I prescribed cefdinir 300 twice a day for 7 days. Bactrim coverage of pseudomonas is not reliable although the degree of erythema looks a lot better 01/30/18; the patient has completed his antibiotics. The area does not have infection on the right lateral foot. The wounds look a lot better. We've been using silver alginate 02/12/18; there is on the right lateral foot. Copious amounts of surface eschar. Also similarly over the PIP of the right second toe. We've been using silver alginate to date 03/02/2018; the area that remains is on the right lateral foot. The right second toe is healed. 03/16/2018; the area there is original wound on the right lateral foot. Still requiring debridement. We are using Hydrofera Blue. The right second toe is still healed/closed 03/30/2018; right lateral foot there is 2 small open areas remaining. They actually look quite healthy we have been using Hydrofera Blue the right second toe remains closed 04/13/2018; right lateral foot. 1 of the 2 small open areas from last time has closed over. The other had surface slough that I removed with a #3 curette. This looks quite healthy. The right second toe remains closed. We are using Hydrofera Blue 05/01/2018; right lateral foot very superficial areas that look like they are trying to  close over. He had a new threatened area on the left medial ankle just below the medial malleolus. I think these represent chronic venous insufficiency areas 05/14/2018; the right right lateral foot looks like it  closed over as was predicted last time however he has some dark areas subcutaneously erythema superiorly and this is really very tender. This is either coexistent cellulitis or perhaps a deep tissue injury. The area on the left medial ankle is superficial but has some size. We have been using Hydrofera Blue to both areas. The patient complains of pain in his right foot 06/09/2018; right lateral foot was closed over last time but there was coexistent cellulitis around this. I gave him Levaquin. He also has an area on the left medial malleolus. He has not been back to clinic in over 3-1/2 weeks. We are using Hydrofera Blue I think to the left medial malleolus and silver alginate to the right lateral foot 1/21; right lateral foot still non-viable debris over this area and the left medial malleolus. Both of these vigorously washed off with antiseptic gauze. We have been using silver alginate. 2/4; right lateral foot this looks somewhat better. Unfortunately the area over the left medial malleolus has deteriorated. Using silver alginate in both area 2/18; both wounds arrived today covered in a lot of necrotic debris. We have been using silver alginate. Clearly not making a lot of progress. 3/3; the patient's right second toe is reopened over the PIP. I am not sure why this is happening. He may have microvascular disease. We have checked his macrovascular flow in the past and found it to be normal. He may need an x-ray. The area on the right lateral foot requires debridement. The area on the left lateral ankle is worse. We have been using PolyMem Ag 08/26/18 on evaluation today patient appears to be doing a little worse in regard to his lower extremities based on what I'm seeing what the nursing staff is telling me. His left medial ankle appears potential to be infected there is your thing on want surrounding the wound. His right second toe is also of concern at this point in my pinion. We have not done x-rays  of these areas that may be a good idea to go ahead and proceed with at this point to ensure will not deal with anything such as osteomyelitis. 4/16; it is been a while since I saw this patient. He now has wounds on the right lateral foot, dorsal right second toe, substantially on the left medial malleolus and a small area on the left lateral foot. We have been using PolyMem Silver. Curlex and Coban. He has home health changing the dressing. Since last time he was here he had a culture of the left medial ankle that showed MRSA. He is completed a course/7 days of doxycycline. XRAYS of the left ankle and right foot that were ordered were negative for osteomyelitis 4/30; he arrives in clinic today with some maceration around the wound and a lot of tenderness and erythema in a circumferential area around the right lateral foot. He has wound areas on the right lateral foot dorsal right second toe, largely over the left medial malleolus and a small area over the left lateral foot 5/7; completed his antibiotics from last time right lateral foot cellulitis is better and the area on his left knee which we discovered after he had been seen by the provider also is improved. He still has extensive wound areas over the right lateral  foot, left medial malleolus, right second toe and left lateral foot although all of these look somewhat better with PolyMem 5/15. Wounds on the right lateral foot, right second toe left medial malleolus and the left lateral foot. All of these look somewhat better. We have been using PolyMem Ag 5/26; the wound on the right lateral foot is almost closed over but he still complains of a lot of pain in this area. He has a eschared area on the right second toe but no open wound.Geoffry Paradise concerning today his original wound on the left medial malleolus looks healthy however just inferior to this almost areas of deep tissue injury and there is an area on the left lateral foot also looks like  a deep tissue injury. In the past I have wondered whether he might have cholesterol emboli or some other form of a vasculopathy. I wonder whether this could have something to do with this. 6/2; right lateral foot wound is still open I gave him empiric antibiotics last week without really a lot of change she is still complaining of pain in this area. We x- rayed his feet at some point in April these were negative. It would be difficult to do an MRI. The area on his left medial malleolus looks better. Right second toe is healed. He still has what looks to be a small pressure related area on the left lateral foot but it is not open. We are using PolyMem to all wound which seems to be doing nicely 6/16; right lateral foot wound is still open. The wound looks better although there is still a lot of tenderness around it. Rather than more empiric antibiotics I have cultured this today. We are using PolyMem Ag. On the left lateral malleolus the wound appears better. He has a new area on the left lower anterior pretibial area 6/29; 2-week follow-up. Culture I did of the right lateral foot 2 weeks ago showed MRSA. I gave him 2 weeks of Septra which he should be just about finishing now and this seems better. We got a call from home health last week to report swelling in the left leg because we were listed as his primary. We referred him back to primary care. He arrives today with intense erythema and tenderness around the 2 wounds on the left medial ankle on the left lower calf. The area on the right lateral foot looks better 7/7; I brought him back this week to follow-up on his left foot which had cellulitis last week. I gave him clindamycin which as far as I know he tolerated. The erythema is better. He has a 3 current wounds one on the right lateral foot a small area on the right lateral malleolus and the more recent area on the left anterior pretibial area. 7/21; bilateral distal lower leg and foot wounds.  The area on his right lateral foot looks a lot better. He has an area on his left pretibial area distally. The area on the medial malleolus on the left which is 1 of his worst wounds has closed over. He does not appear to have any open areas in his remaining toes 8/4-Patient returns at 2 weeks, he has the remaining wound on the left lower leg the other 2 wounds have healed. Patient is continued to be treated with silver alginate with a 2 layer wrap on the left 8/18; I been following this patient for a long period for bilateral lower extremity wounds. 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 His wounds are probably pressure although he religiously wears thick bunny boots. He does not have macrovascular disease but I did formal testing on him perhaps 2 or 3 years ago. I have often wondered about either cholesterol emboli or a vasculopathy of some sort given the distal nature of these variable locations etc. He has 2 areas that we have been following one on the left medial lower leg and one on the right lateral foot. Both of these are mostly healed today there is some eschar over sections of both areas although I did not disturb this. 03/31/2019 on evaluation today patient appears to be doing a little bit more poorly in regard to the sacral region. He has 2 small wounds noted at this point. Fortunately there is no signs of active infection at this time. No fevers, chills, nausea, vomiting, or diarrhea. He is having some pain when he sits on his gluteal region a big part of the reason he has these wounds is likely due to the fact that his wheelchair has not been functioning properly. He does still have a couple areas of deep tissue injury on his lower extremities he does need new Prevalon offloading boots as well. 04/28/2019 on evaluation today patient appears to be doing well with regard to his wounds in particular. His ankle area has fairly small based on  what I am seeing currently and his sacral region actually is still open but does not appear to be doing too poorly in my opinion. Fortunately there is no signs of active infection at this time. He was in the hospital unfortunately and this was from 04/08/2019 through 04/12/2019 secondary to persistent diarrhea, dehydration and acute kidney injury. With that being said fortunately the patient does not seem to be doing too poorly at this time which is good news. He is having pain mainly in the sacral region. 06/23/2019 on evaluation today patient appears to be doing well with regard to his sacral region which in fact is completely healed. Unfortunately he has an area of rubbing/pressure on his right lateral chest/back region. This is where it is rubbing up on his chair. Subsequently he also has an open wound on the left medial malleolus which is not very open but starting to crack and then on the right lateral foot this is much more open at this point. Unfortunately there is no signs of significant improvement in regard to these areas and in fact there does appear to be evidence of active infection quite significantly. No fevers, chills, nausea, vomiting, or diarrhea. The patient did test positive for COVID-19 that has been greater than 14 days ago although they did not know the exact timing. That is why he was not here for his last appointment. 07/07/2019 upon evaluation today patient actually appears to be doing quite well in regard to his left ankle as well as the chest/back region which is doing great in fact appears to be healed in my opinion. He still has an issue on his lateral right foot that is open and appears to be infected but does have me more concerned. In fact I do believe the doxycycline helped in general for the cellulitis but I believe that may have been more MRSA related based on the culture that we did. What I am seeing right now has more of the blue-green drainage and may be more  consistent with a Pseudomonas that I was hoping was not really causing much of  an issue but I think it may be at this point on the right lateral foot. For that reason I do think treatment would be a good idea of the reason I avoided the Cipro before was the potential for QT prolongation which could obviously cause problems with the patient with results of interaction with respiratory wound. The tizanidine also had some interactions as well. Nonetheless I think at this point that it would be a possibility for [place] to use topical gentamicin to see if this could be of benefit for the patient underneath the silver alginate dressing. 07/21/2019 upon evaluation today patient appears to be doing fairly well at this point in regard to his lower extremity on the right. He does have a wound on his right lateral foot as well as the right fifth toe and the right fourth toe. Unfortunately he continues to experience significant issues with pressure and trauma to some degree and I think this is just due to the fact that again he is not really not able to control his legs and what is happening here. Fortunately there is no signs of active infection at this time. 08/04/2019 upon evaluation today patient actually appears to be doing quite well with regard to his toe ulcers I am very pleased in that regard. Unfortunately the foot ulcer is showing some signs of erythema his primary care provider has placed him on clindamycin at this point. Again I do believe this can be beneficial for him. With that being said I do think the patient may benefit from a light Kerlix and Coban wrap to help with some of his edema at this point. 08/18/2019 upon evaluation today patient appears to be doing still poorly in regard to his lateral foot ulcer. This is not very deep but there is some concern about the possibility of osteomyelitis. I think it would be appropriate for [place] to go ahead and have an x-ray at this time. The patient is currently on  clindamycin which from his culture which was performed on 06/23/2019 it does appear that he would benefit from that in regard to the MRSA. However Pseudomonas was also identified then and the patient actually has some blue-green drainage at this point as well. I am much more concerned about the possibility of the Pseudomonas being an infection is causative agent at this point that I am the MRSA. Subsequently Cipro the patient was sensitive to but we never use that is stable use a topical gent due to the fact that unfortunately there was some interaction between the Cipro and the patient's risk for down potentially causing QT prolongation. 08/31/2019; have not seen this patient in quite a period of time. He has a substantial wound on his right lateral foot we have been using gentamicin ointment with Hydrofera Blue over the top. From what I am able to determine he is also on antibiotics perhaps clindamycin although I have not had a chance to look over this. He also has an eschared area on the right fifth toe as well as the right second toe. 4/20; right lateral foot wound. He comes in today with intense erythema and tenderness around this wound. The wound itself has a surface on it but it doesn't look particularly healthy a lot of gelatinous debris. There is no palpable bone. We've been using gentamicin ointment and Hydrofera Blue. Since the patient was last here he was seen by Dr. Algis Liming of infectious disease. He ordered an MRI that I think is due later this month  on 4/30. He apparently also checked a sedimentation rate and C-reactive protein metabolic panel CBC with differential. At the time he was seen he wanted to withhold any microbials pending identification of the depth of the infection although looking at this I don't know that I'm going to be able to hold off for another 10 days. 4/29; he went on to have the MRI that was ordered by Dr. Algis Liming of infectious disease. 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 I had given him cefdinir last week for surrounding cellulitis he is finishing this today. I have communicated with Dr. Algis Liming who wants bone for pathology and culture before proceeding with consideration of IV antibiotics. Fortunately he is due to see Dr. Samuella Cota of podiatry today I am hopeful that the bone biopsy and culture will be considered there 5/4 as I understand things Dr. Samuella Cota actually wants to remove the diseased proximal fifth metatarsal. Looking back at this decision is probably not a bad one. He has good blood flow he is nonambulatory they are apparently going to do this next week. Paradoxically the wound on the lateral part of his foot is actually a lot better. The cellulitis that was so problematic 2 weeks ago also has resolved. READMISSION 03/23/2020 T is now a 60 year old man he has cerebral palsy and is functionally paraplegic. The last time I saw him he had a diseased proximal fifth metatarsal. 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 Samuella Cota. Culture of this showed Pseudomonas and he was followed by infectious disease Dr. Algis Liming treated with ciprofloxacin. 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 He had Integra placed. A CandS showed Pseudomonas again. Since then he has had Silvadene as a primary dressing, then Santyl and more recently Xeroform and then back to Santyl 3 times a week per his attendant who comes with him. Reviewing his records I was really not prepared for the number of wounds that he currently has. This includes substantial areas on the right lateral foot, right lateral ankle, right fourth toe dorsally, right ankle anteriorly as well as the left lateral ankle and foot. His medical history is unchanged ABIs 1.2 on the right and 1.21 on the left he has not been known to  have arterial insufficiency and is not a diabetic 11/5; patient I readmitted to the clinic last week. He has multiple wounds on the right lateral foot, right fourth toe and a new area on the right second toe. He has areas on the right dorsal ankle and a substantive wound over the left lateral ankle and foot. We are using silver collagen. We put him under compression last week and this seems to have helped. He has home health coming out one other time per week to change the dressing 11/12; no real improvement. Multiple bilateral foot and ankle wounds. ooOn the left he has a substantial wound on the left lateral ankle ooOn the right he has wounds on the dorsal second and fourth toes. ooRight lateral foot we have merged to wounds these are necrotic ooShe has an area on the right dorsal ankle and a area on the right medial malleolus with marked surrounding erythema likely cellulitis. The patient complains about pain that kept him awake all last night from wraps that were too tight. He is not known to have arterial insufficiency.  11/18; no real change. I gave him empiric Levaquin last week directed at previously cultured Pseudomonas this is not really helped. On the right he has wounds on the right lateral foot right lateral ankle right medial ankle and the dorsal second and fourth toes. Most of these do not have a nice have a healthy surface. There is erythema in his foot which may all be venous stasis. The area on the left lateral malleolus/ankle area looks better 12/3; really disappointing if anything deterioration especially on the right lateral foot. These are necrotic. He also has areas on the right dorsal ankle right medial malleolus and the right second and fourth toes. On the left he has a large area over the left medial ankle. There is some erythema around the wound. Things are very tender I have reviewed his most recent arterial Dopplers which are actually in 2019 but he has never been felt  to have arterial issues. At that time his ABI on the right was 1.16 [no TBI due to amputation] on the left his ABI was 1.20 with a great toe pressure of 0.24. Waveforms on the right and left toes were all normal he was not felt to have significant evidence of lower extremity arterial disease. ABIs in our clinic have always been normal his pulses have been palpable. He had an MRI of his right foot in April 2021 this showed a soft tissue ulceration along the lateral midfoot with underlying osteomyelitis no abscess. 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 He returned to our clinic on 03/23/2020. The same pattern of wounds that I am describing currently this was a lot worse than earlier in the year. I gave him empiric antibiotics because he had had Pseudomonas in the past this does not seem to have helped. We have not made any progress on any of these wound site. The patient is in a lot of pain 12/10; the patient comes in with a new deep tissue injury on the right heel large necrotic wound on the right lateral foot, smaller necrotic wound on the dorsal ankle and medial ankle. Necrotic wounds on the second and fourth toes. We have been using Hydrofera Blue On the left he has the area on the left little medial ankle which actually looks some better healthier looking tissue. We are using Hydrofera Blue here as well X-ray ordered last week showed no osseous erosion no plain film indication of osteomyelitis. 12/17; PCR culture I did of the right lateral foot wound last week showed a large titer of Enterococcus faecalis and low titers of of coag negative staph and Peptostreptococcus. I have started him on Augmentin for 2 weeks but I may continue beyond this. He has continuous complaints of pain now apparently making it difficult for him to rest at night. Substantial wound area in the right foot and ankle. Unfortunately the left medial foot seems to be more substantial this week. 12/27; in general the  wounds on the right foot and ankle and the left medial ankle look better. There is certainly less erythema in the right foot. I have him on Augmentin and I am going to extend this for another week. MRI is booked for 06/01/2020 06/02/2020; patient's extensive skin breakdown on the right foot continues it is stable to perhaps slightly improved however there is a considerable amount of tissue breakdown. Unfortunately his MRI shows findings consistent with osteomyelitis in the lateral aspect of the cuboid. The overlying wound in this area is deep but  less necrotic than I am used to seeing. He has area on the right posterior heel is now necrotic.. The area on the left lateral ankle/foot looks somewhat better. The patient states he continues to be in a lot of pain from the right foot and he is really tired of hurting from these wounds. I have discussed things with T before. He is anxious to be out of pain. He is nonambulatory. Furthermore the osteomyelitis in the right foot and the erry extensive wounds in this area are only a source of potential systemic infection. He seems in favor of proceeding with a below-knee amputation. I cannot argue with this we have not made a lot of progress in several months working on these wounds ever since his fifth ray amputation by Dr. Samuella Cota Objective Constitutional Sitting or standing Blood Pressure is within target range for patient.. Pulse regular and within target range for patient.Marland Kitchen Respirations regular, non-labored and within target range.. Temperature is normal and within the target range for the patient.Marland Kitchen Appears in no distress. Vitals Time Taken: 10:17 AM, Temperature: 98.3 F, Pulse: 92 bpm, Respiratory Rate: 16 breaths/min, Blood Pressure: 132/83 mmHg. Cardiovascular Dorsalis pedis pulses are palpable on the right foot. Edema is well controlled. General Notes: Wound exam ooOn the left side the wound over the ankle looks generally healthy we have been using  Iodoflex ooOn the right medial malleolus generally clean looking wound no debridement. On the right posterior heel necrotic eschar I did not debride this. On the right lateral foot the wound is extensive but with a better looking surface than what he has had in the last few weeks. He has wounds on the second and fourth toe which also seem better Integumentary (Hair, Skin) Wound #40 status is Healed - Epithelialized. Original cause of wound was Gradually Appeared. The wound is located on the Left,Medial Lower Leg. The wound measures 0cm length x 0cm width x 0cm depth; 0cm^2 area and 0cm^3 volume. There is no tunneling or undermining noted. There is a none present amount of drainage noted. The wound margin is flat and intact. There is no granulation within the wound bed. There is no necrotic tissue within the wound bed. Wound #41 status is Open. Original cause of wound was Trauma. The wound is located on the Right T Fourth. The wound measures 0.8cm length x 0.6cm oe width x 0.1cm depth; 0.377cm^2 area and 0.038cm^3 volume. There is Fat Layer (Subcutaneous Tissue) exposed. There is no tunneling or undermining noted. There is a small amount of serosanguineous drainage noted. The wound margin is flat and intact. There is medium (34-66%) pink granulation within the wound bed. There is a medium (34-66%) amount of necrotic tissue within the wound bed including Adherent Slough. Wound #42 status is Open. Original cause of wound was Gradually Appeared. The wound is located on the Right,Medial Malleolus. The wound measures 0.4cm length x 1.1cm width x 0.1cm depth; 0.346cm^2 area and 0.035cm^3 volume. There is Fat Layer (Subcutaneous Tissue) exposed. There is no tunneling or undermining noted. There is a medium amount of serosanguineous drainage noted. The wound margin is flat and intact. There is large (67-100%) red granulation within the wound bed. There is no necrotic tissue within the wound bed. Wound #43  status is Open. Original cause of wound was Pressure Injury. The wound is located on the Right,Distal,Anterior Lower Leg. The wound measures 1.5cm length x 0.4cm width x 0.2cm depth; 0.471cm^2 area and 0.094cm^3 volume. There is Fat Layer (Subcutaneous Tissue) exposed. There  is no tunneling or undermining noted. There is a small amount of serosanguineous drainage noted. The wound margin is flat and intact. There is small (1-33%) red granulation within the wound bed. There is a large (67-100%) amount of necrotic tissue within the wound bed including Adherent Slough. Wound #44 status is Open. Original cause of wound was Pressure Injury. The wound is located on the Right,Proximal,Lateral Foot. The wound measures 4.4cm length x 3cm width x 0.1cm depth; 10.367cm^2 area and 1.037cm^3 volume. There is tendon and Fat Layer (Subcutaneous Tissue) exposed. There is no tunneling or undermining noted. There is a medium amount of serosanguineous drainage noted. The wound margin is flat and intact. There is small (1-33%) pink granulation within the wound bed. There is a large (67-100%) amount of necrotic tissue within the wound bed including Adherent Slough. Wound #46 status is Open. Original cause of wound was Gradually Appeared. The wound is located on the Right T Second. The wound measures 0.8cm oe length x 0.7cm width x 0.1cm depth; 0.44cm^2 area and 0.044cm^3 volume. There is Fat Layer (Subcutaneous Tissue) exposed. There is no tunneling or undermining noted. There is a small amount of serosanguineous drainage noted. The wound margin is flat and intact. There is large (67-100%) red, pink granulation within the wound bed. There is a small (1-33%) amount of necrotic tissue within the wound bed including Adherent Slough. Wound #47 status is Open. Original cause of wound was Gradually Appeared. The wound is located on the Left,Medial Malleolus. The wound measures 2.2cm length x 2.5cm width x 0.1cm depth; 4.32cm^2  area and 0.432cm^3 volume. There is Fat Layer (Subcutaneous Tissue) exposed. There is no tunneling or undermining noted. There is a medium amount of serosanguineous drainage noted. The wound margin is distinct with the outline attached to the wound base. There is medium (34-66%) pink granulation within the wound bed. There is a medium (34-66%) amount of necrotic tissue within the wound bed including Adherent Slough. Wound #48 status is Open. Original cause of wound was Pressure Injury. The wound is located on the Right Calcaneus. The wound measures 1.5cm length x 3.5cm width x 0.1cm depth; 4.123cm^2 area and 0.412cm^3 volume. There is Fat Layer (Subcutaneous Tissue) exposed. There is no tunneling or undermining noted. There is a small amount of serosanguineous drainage noted. The wound margin is flat and intact. There is no granulation within the wound bed. There is a large (67-100%) amount of necrotic tissue within the wound bed including Eschar. 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 Bathing/ Shower/ Hygiene: May shower with protection but do not get wound dressing(s) wet. Edema Control - Lymphedema / SCD / Other: Elevate legs to the level of the heart or above for 30 minutes daily and/or when sitting, a frequency of: Off-Loading: Heel suspension boot to: - heel protectors at all times Other: - avoid any pressure to back of heels Home Health: No change in wound care orders this week; continue Home Health for wound care. May utilize formulary  equivalent dressing for wound treatment orders unless otherwise specified. Other Home Health Orders/Instructions: - [facility] Consults ordered were: Orthopedic - Dr. 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 Peri-Wound Care: Sween Lotion (Moisturizing lotion) 2 x Per Week/30 Days Discharge Instructions: Apply moisturizing lotion with dressing changes Topical: Bactroban (Home Health) 2 x Per Week/30 Days Discharge Instructions: apply to wound bed under alginate Prim Dressing: KerraCel Ag Gelling Fiber Dressing, 4x5 in (silver alginate) (Home Health) 2 x Per Week/30 Days ary Discharge Instructions: Apply silver alginate to wound bed as instructed Secondary Dressing: Woven Gauze Sponge, Non-Sterile 4x4 in (Home Health) 2 x Per Week/30 Days Discharge Instructions: Apply over primary dressing as directed. Secondary Dressing: ABD Pad, 5x9 (Home Health) 2 x Per Week/30 Days Discharge Instructions: Apply over primary dressing as directed. Com pression Wrap: Kerlix Roll 4.5x3.1 (in/yd) (Home Health) 2 x Per Week/30 Days Discharge Instructions: Apply Kerlix and Coban compression as directed. Com pression Wrap: Coban Self-Adherent Wrap 4x5 (in/yd) (Home Health) 2 x Per Week/30 Days Discharge Instructions: Apply over Kerlix as directed. WOUND #42: - Malleolus Wound Laterality: Right, Medial Cleanser: Wound Cleanser (Home Health) 2 x Per Week/30 Days Discharge Instructions: Cleanse the wound with wound cleanser prior to applying a clean dressing using gauze sponges, not tissue or cotton balls. Peri-Wound Care: Sween Lotion (Moisturizing lotion) 2 x Per Week/30 Days Discharge  Instructions: Apply moisturizing lotion with dressing changes Topical: Bactroban (Home Health) 2 x Per Week/30 Days Discharge Instructions: apply to wound bed under alginate Prim Dressing: KerraCel Ag Gelling Fiber Dressing, 4x5 in (silver alginate) (Home Health) 2 x Per Week/30 Days ary Discharge Instructions: Apply silver alginate to wound bed as instructed Secondary Dressing: Woven Gauze Sponge, Non-Sterile 4x4 in (Home Health) 2 x Per Week/30 Days Discharge Instructions: Apply over primary dressing as directed. Secondary Dressing: ABD Pad, 5x9 (Home Health) 2 x Per Week/30 Days Discharge Instructions: Apply over primary dressing as directed. Com pression Wrap: Kerlix Roll 4.5x3.1 (in/yd) (Home Health) 2 x Per Week/30 Days Discharge Instructions: Apply Kerlix and Coban compression as directed. Com pression Wrap: Coban Self-Adherent Wrap 4x5 (in/yd) (Home Health) 2 x Per Week/30 Days Discharge Instructions: Apply over Kerlix as directed. WOUND #43: - Lower Leg Wound Laterality: Right, Anterior, Distal Cleanser: Wound Cleanser (Home Health) 2 x Per Week/30 Days Discharge Instructions: Cleanse the wound with wound cleanser prior to applying a clean dressing using gauze sponges, not tissue or cotton balls. Peri-Wound Care: Sween Lotion (Moisturizing lotion) 2 x Per Week/30 Days Discharge Instructions: Apply moisturizing lotion with dressing changes Topical: Bactroban (Home Health) 2 x Per Week/30 Days Discharge Instructions: apply to wound bed under alginate Prim Dressing: KerraCel Ag Gelling Fiber Dressing, 4x5 in (silver alginate) (Home Health) 2 x Per Week/30 Days ary Discharge Instructions: Apply silver alginate to wound bed as instructed Secondary Dressing: Woven Gauze Sponge, Non-Sterile 4x4 in (Home Health) 2 x Per Week/30 Days Discharge Instructions: Apply over primary dressing as directed. Secondary Dressing: ABD Pad, 5x9 (Home Health) 2 x Per Week/30 Days Discharge Instructions:  Apply over primary dressing as directed. Com pression Wrap: Kerlix Roll 4.5x3.1 (in/yd) (Home Health) 2 x Per Week/30 Days Discharge Instructions: Apply Kerlix and Coban compression as directed. Com pression Wrap: Coban Self-Adherent Wrap 4x5 (in/yd) (Home Health) 2 x Per Week/30 Days Discharge Instructions: Apply over Kerlix as directed. WOUND #44: -  Foot Wound Laterality: Right, Lateral, Proximal Cleanser: Wound Cleanser (Home Health) 2 x Per Week/30 Days Discharge Instructions: Cleanse the wound with wound cleanser prior to applying a clean dressing using gauze sponges, not tissue or cotton balls. Peri-Wound Care: Sween Lotion (Moisturizing lotion) 2 x Per Week/30 Days Discharge Instructions: Apply moisturizing lotion with dressing changes Topical: Bactroban (Home Health) 2 x Per Week/30 Days Discharge Instructions: apply to wound bed under alginate Prim Dressing: KerraCel Ag Gelling Fiber Dressing, 4x5 in (silver alginate) (Home Health) 2 x Per Week/30 Days ary Discharge Instructions: Apply silver alginate to wound bed as instructed Secondary Dressing: Woven Gauze Sponge, Non-Sterile 4x4 in (Home Health) 2 x Per Week/30 Days Discharge Instructions: Apply over primary dressing as directed. Secondary Dressing: ABD Pad, 5x9 (Home Health) 2 x Per Week/30 Days Discharge Instructions: Apply over primary dressing as directed. Com pression Wrap: Kerlix Roll 4.5x3.1 (in/yd) (Home Health) 2 x Per Week/30 Days Discharge Instructions: Apply Kerlix and Coban compression as directed. Com pression Wrap: Coban Self-Adherent Wrap 4x5 (in/yd) (Home Health) 2 x Per Week/30 Days Discharge Instructions: Apply over Kerlix as directed. WOUND #46: - T Second Wound Laterality: Right oe Cleanser: Wound Cleanser (Home Health) 2 x Per Week/30 Days Discharge Instructions: Cleanse the wound with wound cleanser prior to applying a clean dressing using gauze sponges, not tissue or cotton balls. Peri-Wound Care: Sween  Lotion (Moisturizing lotion) 2 x Per Week/30 Days Discharge Instructions: Apply moisturizing lotion with dressing changes Topical: Bactroban (Home Health) 2 x Per Week/30 Days Discharge Instructions: apply to wound bed under alginate Prim Dressing: KerraCel Ag Gelling Fiber Dressing, 4x5 in (silver alginate) (Home Health) 2 x Per Week/30 Days ary Discharge Instructions: Apply silver alginate to wound bed as instructed Secondary Dressing: Woven Gauze Sponge, Non-Sterile 4x4 in (Home Health) 2 x Per Week/30 Days Discharge Instructions: Apply over primary dressing as directed. Secondary Dressing: ABD Pad, 5x9 (Home Health) 2 x Per Week/30 Days Discharge Instructions: Apply over primary dressing as directed. Com pression Wrap: Kerlix Roll 4.5x3.1 (in/yd) (Home Health) 2 x Per Week/30 Days Discharge Instructions: Apply Kerlix and Coban compression as directed. Com pression Wrap: Coban Self-Adherent Wrap 4x5 (in/yd) (Home Health) 2 x Per Week/30 Days Discharge Instructions: Apply over Kerlix as directed. WOUND #47: - Malleolus Wound Laterality: Left, Medial Cleanser: Wound Cleanser (Home Health) 2 x Per Week/30 Days Discharge Instructions: Cleanse the wound with wound cleanser prior to applying a clean dressing using gauze sponges, not tissue or cotton balls. Peri-Wound Care: Sween Lotion (Moisturizing lotion) (Home Health) 2 x Per Week/30 Days Discharge Instructions: Apply moisturizing lotion with dressing changes Prim Dressing: Hydrofera Blue Classic Foam, 4x4 in (Home Health) 2 x Per Week/30 Days ary Discharge Instructions: Apply to wound bed as instructed Secondary Dressing: ABD Pad, 5x9 (Home Health) 2 x Per Week/30 Days Discharge Instructions: Apply over primary dressing as directed. Com pression Wrap: Kerlix Roll 4.5x3.1 (in/yd) (Home Health) 2 x Per Week/30 Days Discharge Instructions: Apply Kerlix and Coban compression , be sure to wrap from base of toes to tibial tuberosity. no too  tight Com pression Wrap: Coban Self-Adherent Wrap 4x5 (in/yd) (Home Health) 2 x Per Week/30 Days Discharge Instructions: Apply over Kerlix as directed. WOUND #48: - Calcaneus Wound Laterality: Right Cleanser: Wound Cleanser (Home Health) 2 x Per Week/30 Days Discharge Instructions: Cleanse the wound with wound cleanser prior to applying a clean dressing using gauze sponges, not tissue or cotton balls. Peri-Wound Care: Sween Lotion (Moisturizing lotion) 2 x Per Week/30 Days  Discharge Instructions: Apply moisturizing lotion with dressing changes Topical: Bactroban (Home Health) 2 x Per Week/30 Days Discharge Instructions: apply to wound bed under alginate Prim Dressing: KerraCel Ag Gelling Fiber Dressing, 4x5 in (silver alginate) (Home Health) 2 x Per Week/30 Days ary Discharge Instructions: Apply silver alginate to wound bed as instructed Secondary Dressing: Woven Gauze Sponge, Non-Sterile 4x4 in (Home Health) 2 x Per Week/30 Days Discharge Instructions: Apply over primary dressing as directed. Secondary Dressing: ABD Pad, 5x9 (Home Health) 2 x Per Week/30 Days Discharge Instructions: Apply over primary dressing as directed. Secondary Dressing: ALLEVYN Heel 4 1/2in x 5 1/2in / 10.5cm x 13.5cm (Home Health) 2 x Per Week/30 Days Discharge Instructions: Apply over primary dressing as directed. Com pression Wrap: Kerlix Roll 4.5x3.1 (in/yd) (Home Health) 2 x Per Week/30 Days Discharge Instructions: Apply Kerlix and Coban compression as directed. Com pression Wrap: Coban Self-Adherent Wrap 4x5 (in/yd) (Home Health) 2 x Per Week/30 Days Discharge Instructions: Apply over Kerlix as directed. 1. Any progress we have made with the extensive wound area on the right foot is minimal. The surface of the wounds perhaps look a little better and the superficial cellulitis and perhaps soft tissue infections are better. 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 He is nonambulatory. He continues to be  in a lot of pain and really he is anxious to proceed with a below-knee amputation to avoid further pain and difficulties in this foot. I cannot argue with him. He has been fairly consistent in this. The patient has cerebral palsy and is always seemed fairly good intellectually to me however his sister is apparently his POA and is involved in these decisions and all make an attempt to discuss this with her. 2. The area on the left medial heel looks better. Still using Iodoflex may be able to change off that next week. 3. I have referred this patient to Dr. Victorino Dike for consideration of a below-knee amputation perhaps an above-knee Electronic Signature(s) Signed: 06/02/2020 4:48:18 PM By: Baltazar Najjar MD Entered By: Baltazar Najjar on 06/02/2020 13:00:33 -------------------------------------------------------------------------------- SuperBill Details Patient Name: Date of Service: [name] 06/02/2020 Medical Record Number: [id number] Patient Account Number: [id number] Date of Birth/Sex: Treating RN: Aug 09, 1960 (59 y.o. [name] Primary Care Provider: [name] Other Clinician: Referring Provider: Treating Provider/Extender: [name], [name] in Treatment: 10 Diagnosis Coding ICD-10 Codes Code Description I89.0 Lymphedema, not elsewhere classified T81.31XD Disruption of external operation (surgical) wound, not elsewhere classified, subsequent encounter S90.811D Abrasion, right foot, subsequent encounter 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 L97.322 Non-pressure chronic ulcer of left ankle with fat layer exposed L89.616 Pressure-induced deep tissue damage of right heel Facility Procedures Electronic Signature(s) Signed: 06/02/2020 4:46:42 PM By: Zenaida Deed RN, BSN Signed: 06/02/2020 4:48:18 PM By: Baltazar Najjar MD Entered  By: Zenaida Deed on 06/02/2020 11:24:29

## 2020-06-02 NOTE — Progress Notes (Signed)
MARKES, SHATSWELL ([id number]) Visit Report for 06/02/2020 Arrival Information Details Patient Name: Date of Service: [name], [name] 06/02/2020 9:45 A M Medical Record Number: [id number] Patient Account Number: [id number] Date of Birth/Sex: Treating RN: 1961/02/21 (60 y.o. [name] Primary Care Rayvion Stumph: [name] Other Clinician: Referring Netanel Yannuzzi: Treating Lakhia Gengler/Extender: [name], [name] in Treatment: 10 Visit Information History Since Last Visit Added or deleted any medications: No Patient Arrived: Wheel Chair Any new allergies or adverse reactions: No Arrival Time: 10:17 Had a fall or experienced change in No Accompanied By: self activities of daily living that may affect Transfer Assistance: None risk of falls: Patient Identification Verified: Yes Signs or symptoms of abuse/neglect since last visito No Secondary Verification Process Completed: Yes Hospitalized since last visit: No Patient Requires Transmission-Based Precautions: No Implantable device outside of the clinic excluding No Patient Has Alerts: No cellular tissue based products placed in the center since last visit: Has Dressing in Place as Prescribed: Yes Pain Present Now: Yes Electronic Signature(s) Signed: 06/02/2020 11:05:27 AM By: Karl Ito Entered By: Karl Ito on 06/02/2020 10:17:42 -------------------------------------------------------------------------------- Clinic Level of Care Assessment Details Patient Name: Date of Service: [name], [name] 06/02/2020 9:45 A M Medical Record Number: [id number] Patient Account Number: [id number] Date of Birth/Sex: Treating RN: January 20, 1961 (60 y.o. [name] Primary Care [name]: [name] Other Clinician: Referring [name]: Treating [name]/Extender: [name], [name] in Treatment: 10 Clinic Level of Care Assessment Items TOOL 4 Quantity Score []  - 0 Use when only an  EandM is performed on FOLLOW-UP visit ASSESSMENTS - Nursing Assessment / Reassessment X- 1 10 Reassessment of Co-morbidities (includes updates in patient status) X- 1 5 Reassessment of Adherence to Treatment Plan ASSESSMENTS - Wound and Skin A ssessment / Reassessment []  - 0 Simple Wound Assessment / Reassessment - one wound X- 7 5 Complex Wound Assessment / Reassessment - multiple wounds []  - 0 Dermatologic / Skin Assessment (not related to wound area) ASSESSMENTS - Focused Assessment X- 2 5 Circumferential Edema Measurements - multi extremities []  - 0 Nutritional Assessment / Counseling / Intervention X- 1 5 Lower Extremity Assessment (monofilament, tuning fork, pulses) []  - 0 Peripheral Arterial Disease Assessment (using hand held doppler) ASSESSMENTS - Ostomy and/or Continence Assessment and Care []  - 0 Incontinence Assessment and Management []  - 0 Ostomy Care Assessment and Management (repouching, etc.) PROCESS - Coordination of Care X - Simple Patient / Family Education for ongoing care 1 15 []  - 0 Complex (extensive) Patient / Family Education for ongoing care X- 1 10 Staff obtains Consents, Records, T Results / Process Orders est X- 1 10 Staff telephones HHA, Nursing Homes / Clarify orders / etc []  - 0 Routine Transfer to another Facility (non-emergent condition) []  - 0 Routine Hospital Admission (non-emergent condition) []  - 0 New Admissions / Manufacturing engineer / Ordering NPWT Apligraf, etc. , []  - 0 Emergency Hospital Admission (emergent condition) X- 1 10 Simple Discharge Coordination []  - 0 Complex (extensive) Discharge Coordination PROCESS - Special Needs []  - 0 Pediatric / Minor Patient Management []  - 0 Isolation Patient Management []  - 0 Hearing / Language / Visual special needs []  - 0 Assessment of Community assistance (transportation, D/C planning, etc.) []  - 0 Additional assistance / Altered mentation []  - 0 Support Surface(s)  Assessment (bed, cushion, seat, etc.) INTERVENTIONS - Wound Cleansing / Measurement []  - 0 Simple Wound Cleansing - one wound X- 7 5 Complex Wound Cleansing - multiple wounds X- 1 5  directed. Coban Self-Adherent Wrap 4x5 (in/yd) Discharge Instruction: Apply over Kerlix as directed. Compression Stockings Add-Ons Electronic Signature(s) Signed: 06/02/2020 4:46:42 PM By: Baruch Gouty RN, BSN Previous Signature: 06/02/2020 11:05:27 AM Version By: Sandre Kitty Entered By: Baruch Gouty on 06/02/2020 11:14:00 -------------------------------------------------------------------------------- Wound Assessment Details Patient Name: Date of Service: [name], [name] 06/02/2020 9:45 A M Medical Record Number: [id number] Patient Account Number: [id number] Date of Birth/Sex: Treating RN: [birth year] (60 y.o. [name] Primary Care Beauden Tremont: [name] Other Clinician: Referring Navi Ewton: Treating Damont Balles/Extender: Lance Sell, [name] in Treatment: 10 Wound Status Wound Number: 47 Primary Venous Leg Ulcer Etiology: Wound Location: Left, Medial Malleolus Wound Open Wounding Event: Gradually Appeared Status: Date Acquired: 05/22/2020 Comorbid Anemia, Deep Vein Thrombosis, Colitis, Osteomyelitis, Weeks Of Treatment: 1 History: Neuropathy, Quadriplegia, Confinement Anxiety Clustered Wound: No Wound Measurements Length: (cm) 2.2 Width: (cm) 2.5 Depth: (cm) 0.1 Area: (cm) 4.32 Volume: (cm) 0.432 % Reduction in Area: 43.9% % Reduction in Volume: 43.9% Epithelialization: Small (1-33%) Tunneling: No Undermining: No Wound Description Classification: Full Thickness Without Exposed Support Structures Wound Margin: Distinct, outline attached Exudate Amount: Medium Exudate Type: Serosanguineous Exudate Color: red, brown Foul  Odor After Cleansing: No Slough/Fibrino Yes Wound Bed Granulation Amount: Medium (34-66%) Exposed Structure Granulation Quality: Pink Fascia Exposed: No Necrotic Amount: Medium (34-66%) Fat Layer (Subcutaneous Tissue) Exposed: Yes Necrotic Quality: Adherent Slough Tendon Exposed: No Muscle Exposed: No Joint Exposed: No Bone Exposed: No Treatment Notes Wound #47 (Malleolus) Wound Laterality: Left, Medial Cleanser Wound Cleanser Discharge Instruction: Cleanse the wound with wound cleanser prior to applying a clean dressing using gauze sponges, not tissue or cotton balls. Peri-Wound Care Sween Lotion (Moisturizing lotion) Discharge Instruction: Apply moisturizing lotion with dressing changes Topical Primary Dressing Hydrofera Blue Classic Foam, 4x4 in Discharge Instruction: Apply to wound bed as instructed Secondary Dressing ABD Pad, 5x9 Discharge Instruction: Apply over primary dressing as directed. Secured With Compression Wrap Kerlix Roll 4.5x3.1 (in/yd) Discharge Instruction: Apply Kerlix and Coban compression , be sure to wrap from base of toes to tibial tuberosity. no too tight Coban Self-Adherent Wrap 4x5 (in/yd) Discharge Instruction: Apply over Kerlix as directed. Compression Stockings Add-Ons Electronic Signature(s) Signed: 06/02/2020 4:46:42 PM By: Baruch Gouty RN, BSN Previous Signature: 06/02/2020 11:05:27 AM Version By: Sandre Kitty Entered By: Baruch Gouty on 06/02/2020 11:14:58 -------------------------------------------------------------------------------- Wound Assessment Details Patient Name: Date of Service: [name], [name] 06/02/2020 9:45 A M Medical Record Number: [id number] Patient Account Number: [id number] Date of Birth/Sex: Treating RN: [birth year] (60 y.o. [name] Primary Care Bravery Ketcham: [name] Other Clinician: Referring Pepe Mineau: Treating Taden Witter/Extender: Lance Sell, [name] in Treatment:  10 Wound Status Wound Number: 48 Primary Pressure Ulcer Etiology: Wound Location: Right Calcaneus Wound Open Wounding Event: Pressure Injury Status: Date Acquired: 06/02/2020 Comorbid Anemia, Deep Vein Thrombosis, Colitis, Osteomyelitis, Weeks Of Treatment: 0 History: Neuropathy, Quadriplegia, Confinement Anxiety Clustered Wound: No Wound Measurements Length: (cm) 1.5 Width: (cm) 3.5 Depth: (cm) 0.1 Area: (cm) 4.123 Volume: (cm) 0.412 % Reduction in Area: % Reduction in Volume: Epithelialization: None Tunneling: No Undermining: No Wound Description Classification: Unstageable/Unclassified Wound Margin: Flat and Intact Exudate Amount: Small Exudate Type: Serosanguineous Exudate Color: red, brown Wound Bed Granulation Amount: None Present (0%) Necrotic Amount: Large (67-100%) Necrotic Quality: Eschar Foul Odor After Cleansing: No Slough/Fibrino No Exposed Structure Fascia Exposed: No Fat Layer (Subcutaneous Tissue) Exposed: Yes Tendon Exposed: No Muscle Exposed: No Joint Exposed: No Bone Exposed: No Treatment Notes Wound #48 (Calcaneus) Wound Laterality: Right Cleanser Wound Cleanser Discharge  MARKES, SHATSWELL ([id number]) Visit Report for 06/02/2020 Arrival Information Details Patient Name: Date of Service: [name], [name] 06/02/2020 9:45 A M Medical Record Number: [id number] Patient Account Number: [id number] Date of Birth/Sex: Treating RN: 1961/02/21 (60 y.o. [name] Primary Care Rayvion Stumph: [name] Other Clinician: Referring Netanel Yannuzzi: Treating Lakhia Gengler/Extender: [name], [name] in Treatment: 10 Visit Information History Since Last Visit Added or deleted any medications: No Patient Arrived: Wheel Chair Any new allergies or adverse reactions: No Arrival Time: 10:17 Had a fall or experienced change in No Accompanied By: self activities of daily living that may affect Transfer Assistance: None risk of falls: Patient Identification Verified: Yes Signs or symptoms of abuse/neglect since last visito No Secondary Verification Process Completed: Yes Hospitalized since last visit: No Patient Requires Transmission-Based Precautions: No Implantable device outside of the clinic excluding No Patient Has Alerts: No cellular tissue based products placed in the center since last visit: Has Dressing in Place as Prescribed: Yes Pain Present Now: Yes Electronic Signature(s) Signed: 06/02/2020 11:05:27 AM By: Karl Ito Entered By: Karl Ito on 06/02/2020 10:17:42 -------------------------------------------------------------------------------- Clinic Level of Care Assessment Details Patient Name: Date of Service: [name], [name] 06/02/2020 9:45 A M Medical Record Number: [id number] Patient Account Number: [id number] Date of Birth/Sex: Treating RN: January 20, 1961 (60 y.o. [name] Primary Care [name]: [name] Other Clinician: Referring [name]: Treating [name]/Extender: [name], [name] in Treatment: 10 Clinic Level of Care Assessment Items TOOL 4 Quantity Score []  - 0 Use when only an  EandM is performed on FOLLOW-UP visit ASSESSMENTS - Nursing Assessment / Reassessment X- 1 10 Reassessment of Co-morbidities (includes updates in patient status) X- 1 5 Reassessment of Adherence to Treatment Plan ASSESSMENTS - Wound and Skin A ssessment / Reassessment []  - 0 Simple Wound Assessment / Reassessment - one wound X- 7 5 Complex Wound Assessment / Reassessment - multiple wounds []  - 0 Dermatologic / Skin Assessment (not related to wound area) ASSESSMENTS - Focused Assessment X- 2 5 Circumferential Edema Measurements - multi extremities []  - 0 Nutritional Assessment / Counseling / Intervention X- 1 5 Lower Extremity Assessment (monofilament, tuning fork, pulses) []  - 0 Peripheral Arterial Disease Assessment (using hand held doppler) ASSESSMENTS - Ostomy and/or Continence Assessment and Care []  - 0 Incontinence Assessment and Management []  - 0 Ostomy Care Assessment and Management (repouching, etc.) PROCESS - Coordination of Care X - Simple Patient / Family Education for ongoing care 1 15 []  - 0 Complex (extensive) Patient / Family Education for ongoing care X- 1 10 Staff obtains Consents, Records, T Results / Process Orders est X- 1 10 Staff telephones HHA, Nursing Homes / Clarify orders / etc []  - 0 Routine Transfer to another Facility (non-emergent condition) []  - 0 Routine Hospital Admission (non-emergent condition) []  - 0 New Admissions / Manufacturing engineer / Ordering NPWT Apligraf, etc. , []  - 0 Emergency Hospital Admission (emergent condition) X- 1 10 Simple Discharge Coordination []  - 0 Complex (extensive) Discharge Coordination PROCESS - Special Needs []  - 0 Pediatric / Minor Patient Management []  - 0 Isolation Patient Management []  - 0 Hearing / Language / Visual special needs []  - 0 Assessment of Community assistance (transportation, D/C planning, etc.) []  - 0 Additional assistance / Altered mentation []  - 0 Support Surface(s)  Assessment (bed, cushion, seat, etc.) INTERVENTIONS - Wound Cleansing / Measurement []  - 0 Simple Wound Cleansing - one wound X- 7 5 Complex Wound Cleansing - multiple wounds X- 1 5  Instruction: Cleanse the wound with wound cleanser prior to applying a clean dressing using gauze sponges, not tissue or cotton balls. Peri-Wound Care Sween Lotion (Moisturizing lotion) Discharge Instruction: Apply moisturizing lotion with dressing changes Topical Bactroban Discharge Instruction: apply to wound bed under alginate Primary Dressing KerraCel Ag Gelling Fiber Dressing, 4x5 in (silver alginate) Discharge Instruction: Apply silver alginate to wound bed as instructed Secondary Dressing Woven Gauze Sponge, Non-Sterile 4x4 in Discharge Instruction: Apply over primary dressing as directed. ABD Pad, 5x9 Discharge Instruction: Apply over primary dressing as directed. ALLEVYN Heel 4 1/2in x 5 1/2in / 10.5cm x 13.5cm Discharge Instruction: Apply over primary  dressing as directed. Secured With Compression Wrap Kerlix Roll 4.5x3.1 (in/yd) Discharge Instruction: Apply Kerlix and Coban compression as directed. Coban Self-Adherent Wrap 4x5 (in/yd) Discharge Instruction: Apply over Kerlix as directed. Compression Stockings Add-Ons Electronic Signature(s) Signed: 06/02/2020 4:46:42 PM By: Zenaida DeedBoehlein, Linda RN, BSN Entered By: Zenaida DeedBoehlein, Linda on 06/02/2020 11:17:46 -------------------------------------------------------------------------------- Vitals Details Patient Name: Date of Service: [name], Idriss L. 06/02/2020 9:45 A M Medical Record Number: [id number] Patient Account Number: [id number] Date of Birth/Sex: Treating RN: 05-30-1960 (60 y.o. [name] SchoonerM) Boehlein, Linda Primary Care Takeru Bose: Jamison OkaBakare, Mobolaji Other Clinician: Referring Staley Lunz: Treating Corie Allis/Extender: Alethia Bertholdobson, Michael Bakare, [name] in Treatment: 10 Vital Signs Time Taken: 10:17 Temperature (F): 98.3 Pulse (bpm): 92 Respiratory Rate (breaths/min): 16 Blood Pressure (mmHg): 132/83 Reference Range: 80 - 120 mg / dl Electronic Signature(s) Signed: 06/02/2020 11:05:27 AM By: Karl Itoawkins, Destiny Entered By: Karl Itoawkins, Destiny on 06/02/2020 10:18:09  directed. Coban Self-Adherent Wrap 4x5 (in/yd) Discharge Instruction: Apply over Kerlix as directed. Compression Stockings Add-Ons Electronic Signature(s) Signed: 06/02/2020 4:46:42 PM By: Baruch Gouty RN, BSN Previous Signature: 06/02/2020 11:05:27 AM Version By: Sandre Kitty Entered By: Baruch Gouty on 06/02/2020 11:14:00 -------------------------------------------------------------------------------- Wound Assessment Details Patient Name: Date of Service: [name], [name] 06/02/2020 9:45 A M Medical Record Number: [id number] Patient Account Number: [id number] Date of Birth/Sex: Treating RN: [birth year] (60 y.o. [name] Primary Care Beauden Tremont: [name] Other Clinician: Referring Navi Ewton: Treating Damont Balles/Extender: Lance Sell, [name] in Treatment: 10 Wound Status Wound Number: 47 Primary Venous Leg Ulcer Etiology: Wound Location: Left, Medial Malleolus Wound Open Wounding Event: Gradually Appeared Status: Date Acquired: 05/22/2020 Comorbid Anemia, Deep Vein Thrombosis, Colitis, Osteomyelitis, Weeks Of Treatment: 1 History: Neuropathy, Quadriplegia, Confinement Anxiety Clustered Wound: No Wound Measurements Length: (cm) 2.2 Width: (cm) 2.5 Depth: (cm) 0.1 Area: (cm) 4.32 Volume: (cm) 0.432 % Reduction in Area: 43.9% % Reduction in Volume: 43.9% Epithelialization: Small (1-33%) Tunneling: No Undermining: No Wound Description Classification: Full Thickness Without Exposed Support Structures Wound Margin: Distinct, outline attached Exudate Amount: Medium Exudate Type: Serosanguineous Exudate Color: red, brown Foul  Odor After Cleansing: No Slough/Fibrino Yes Wound Bed Granulation Amount: Medium (34-66%) Exposed Structure Granulation Quality: Pink Fascia Exposed: No Necrotic Amount: Medium (34-66%) Fat Layer (Subcutaneous Tissue) Exposed: Yes Necrotic Quality: Adherent Slough Tendon Exposed: No Muscle Exposed: No Joint Exposed: No Bone Exposed: No Treatment Notes Wound #47 (Malleolus) Wound Laterality: Left, Medial Cleanser Wound Cleanser Discharge Instruction: Cleanse the wound with wound cleanser prior to applying a clean dressing using gauze sponges, not tissue or cotton balls. Peri-Wound Care Sween Lotion (Moisturizing lotion) Discharge Instruction: Apply moisturizing lotion with dressing changes Topical Primary Dressing Hydrofera Blue Classic Foam, 4x4 in Discharge Instruction: Apply to wound bed as instructed Secondary Dressing ABD Pad, 5x9 Discharge Instruction: Apply over primary dressing as directed. Secured With Compression Wrap Kerlix Roll 4.5x3.1 (in/yd) Discharge Instruction: Apply Kerlix and Coban compression , be sure to wrap from base of toes to tibial tuberosity. no too tight Coban Self-Adherent Wrap 4x5 (in/yd) Discharge Instruction: Apply over Kerlix as directed. Compression Stockings Add-Ons Electronic Signature(s) Signed: 06/02/2020 4:46:42 PM By: Baruch Gouty RN, BSN Previous Signature: 06/02/2020 11:05:27 AM Version By: Sandre Kitty Entered By: Baruch Gouty on 06/02/2020 11:14:58 -------------------------------------------------------------------------------- Wound Assessment Details Patient Name: Date of Service: [name], [name] 06/02/2020 9:45 A M Medical Record Number: [id number] Patient Account Number: [id number] Date of Birth/Sex: Treating RN: [birth year] (60 y.o. [name] Primary Care Bravery Ketcham: [name] Other Clinician: Referring Pepe Mineau: Treating Taden Witter/Extender: Lance Sell, [name] in Treatment:  10 Wound Status Wound Number: 48 Primary Pressure Ulcer Etiology: Wound Location: Right Calcaneus Wound Open Wounding Event: Pressure Injury Status: Date Acquired: 06/02/2020 Comorbid Anemia, Deep Vein Thrombosis, Colitis, Osteomyelitis, Weeks Of Treatment: 0 History: Neuropathy, Quadriplegia, Confinement Anxiety Clustered Wound: No Wound Measurements Length: (cm) 1.5 Width: (cm) 3.5 Depth: (cm) 0.1 Area: (cm) 4.123 Volume: (cm) 0.412 % Reduction in Area: % Reduction in Volume: Epithelialization: None Tunneling: No Undermining: No Wound Description Classification: Unstageable/Unclassified Wound Margin: Flat and Intact Exudate Amount: Small Exudate Type: Serosanguineous Exudate Color: red, brown Wound Bed Granulation Amount: None Present (0%) Necrotic Amount: Large (67-100%) Necrotic Quality: Eschar Foul Odor After Cleansing: No Slough/Fibrino No Exposed Structure Fascia Exposed: No Fat Layer (Subcutaneous Tissue) Exposed: Yes Tendon Exposed: No Muscle Exposed: No Joint Exposed: No Bone Exposed: No Treatment Notes Wound #48 (Calcaneus) Wound Laterality: Right Cleanser Wound Cleanser Discharge  cotton balls. Peri-Wound Care Sween Lotion (Moisturizing lotion) Discharge Instruction: Apply moisturizing lotion with dressing changes Topical Bactroban Discharge Instruction: apply to wound bed under alginate Primary Dressing KerraCel Ag Gelling Fiber Dressing, 4x5 in (silver alginate) Discharge Instruction: Apply silver alginate to wound bed as instructed Secondary Dressing Woven Gauze Sponge, Non-Sterile 4x4 in Discharge Instruction: Apply over primary dressing as directed. ABD Pad, 5x9 Discharge Instruction: Apply over primary dressing as directed. Secured With Compression Wrap Kerlix Roll 4.5x3.1 (in/yd) Discharge Instruction: Apply Kerlix and Coban compression as directed. Coban Self-Adherent Wrap 4x5 (in/yd) Discharge Instruction: Apply over Kerlix as directed. Compression Stockings Add-Ons Electronic Signature(s) Signed: 06/02/2020 4:46:42 PM By: Zenaida Deed RN, BSN Previous Signature: 06/02/2020 11:05:27 AM Version By: Karl Ito Entered By: Zenaida Deed on 06/02/2020 11:11:21 -------------------------------------------------------------------------------- Wound Assessment Details Patient Name: Date of Service: [name], [name] 06/02/2020 9:45 A M Medical Record Number: [id number] Patient Account Number: [id number] Date of  Birth/Sex: Treating RN: 1961-01-21 (60 y.o. [name] Primary Care Nussen Pullin: [name] Other Clinician: Referring Basia Mcginty: Treating Sharia Averitt/Extender: [name], [name] in Treatment: 10 Wound Status Wound Number: 43 Primary Pressure Ulcer Etiology: Wound Location: Right, Distal, Anterior Lower Leg Secondary Venous Leg Ulcer Wounding Event: Pressure Injury Etiology: Date Acquired: 02/21/2020 Wound Open Weeks Of Treatment: 10 Status: Clustered Wound: Yes Comorbid Anemia, Deep Vein Thrombosis, Colitis, Osteomyelitis, History: Neuropathy, Quadriplegia, Confinement Anxiety Wound Measurements Length: (cm) 1.5 Width: (cm) 0.4 Depth: (cm) 0.2 Clustered Quantity: 1 Area: (cm) 0.471 Volume: (cm) 0.094 % Reduction in Area: 65.7% % Reduction in Volume: 65.8% Epithelialization: Small (1-33%) Tunneling: No Undermining: No Wound Description Classification: Category/Stage III Wound Margin: Flat and Intact Exudate Amount: Small Exudate Type: Serosanguineous Exudate Color: red, brown Foul Odor After Cleansing: No Slough/Fibrino Yes Wound Bed Granulation Amount: Small (1-33%) Exposed Structure Granulation Quality: Red Fascia Exposed: No Necrotic Amount: Large (67-100%) Fat Layer (Subcutaneous Tissue) Exposed: Yes Necrotic Quality: Adherent Slough Tendon Exposed: No Muscle Exposed: No Joint Exposed: No Bone Exposed: No Treatment Notes Wound #43 (Lower Leg) Wound Laterality: Right, Anterior, Distal Cleanser Wound Cleanser Discharge Instruction: Cleanse the wound with wound cleanser prior to applying a clean dressing using gauze sponges, not tissue or cotton balls. Peri-Wound Care Sween Lotion (Moisturizing lotion) Discharge Instruction: Apply moisturizing lotion with dressing changes Topical Bactroban Discharge Instruction: apply to wound bed under alginate Primary Dressing KerraCel Ag Gelling Fiber Dressing, 4x5 in (silver  alginate) Discharge Instruction: Apply silver alginate to wound bed as instructed Secondary Dressing Woven Gauze Sponge, Non-Sterile 4x4 in Discharge Instruction: Apply over primary dressing as directed. ABD Pad, 5x9 Discharge Instruction: Apply over primary dressing as directed. Secured With Compression Wrap Kerlix Roll 4.5x3.1 (in/yd) Discharge Instruction: Apply Kerlix and Coban compression as directed. Coban Self-Adherent Wrap 4x5 (in/yd) Discharge Instruction: Apply over Kerlix as directed. Compression Stockings Add-Ons Electronic Signature(s) Signed: 06/02/2020 4:46:42 PM By: Zenaida Deed RN, BSN Previous Signature: 06/02/2020 11:05:27 AM Version By: Karl Ito Entered By: Zenaida Deed on 06/02/2020 11:12:24 -------------------------------------------------------------------------------- Wound Assessment Details Patient Name: Date of Service: MATTHEU, BRODERSEN 06/02/2020 9:45 A M Medical Record Number: [id number] Patient Account Number: [id number] Date of Birth/Sex: Treating RN: April 22, 1961 (60 y.o. [name] Primary Care Shoshana Johal: [name] Other Clinician: Referring Mikell Kazlauskas: Treating Gemini Bunte/Extender: [name], [name] in Treatment: 10 Wound Status Wound Number: 44 Primary Pressure Ulcer Etiology: Wound Location: Right, Proximal, Lateral Foot Wound Open Wounding Event: Pressure Injury Status: Date Acquired: 02/21/2020 Comorbid Anemia, Deep Vein Thrombosis, Colitis, Osteomyelitis, Weeks Of Treatment: 10  primary dressing as directed. Secured With Compression Wrap Kerlix Roll 4.5x3.1 (in/yd) Discharge Instruction: Apply Kerlix and Coban compression as directed. Coban Self-Adherent Wrap 4x5 (in/yd) Discharge Instruction: Apply over Kerlix as directed. Compression Stockings Add-Ons Wound #46 (Toe Second) Wound Laterality: Right Cleanser Wound Cleanser Discharge Instruction: Cleanse the wound with wound cleanser prior to applying a clean dressing using gauze sponges, not tissue or cotton balls. Peri-Wound Care Sween Lotion (Moisturizing lotion) Discharge Instruction: Apply moisturizing lotion with dressing changes Topical Bactroban Discharge Instruction: apply to wound bed under alginate Primary Dressing KerraCel Ag Gelling Fiber Dressing, 4x5 in (silver alginate) Discharge Instruction: Apply silver alginate to wound bed as instructed Secondary Dressing Woven Gauze Sponge, Non-Sterile 4x4 in Discharge Instruction: Apply over primary dressing as directed. ABD Pad, 5x9 Discharge Instruction: Apply over primary dressing as directed. Secured With Compression Wrap Kerlix Roll 4.5x3.1 (in/yd) Discharge Instruction: Apply Kerlix and Coban compression as directed. Coban Self-Adherent Wrap 4x5 (in/yd) Discharge Instruction: Apply over Kerlix as directed. Compression Stockings Add-Ons Wound #47 (Malleolus) Wound Laterality: Left, Medial Cleanser Wound Cleanser Discharge Instruction: Cleanse the wound with wound cleanser prior to applying a clean dressing using gauze sponges, not tissue or cotton balls. Peri-Wound Care Sween Lotion (Moisturizing lotion) Discharge Instruction: Apply moisturizing lotion with dressing changes Topical Primary Dressing Hydrofera Blue Classic Foam, 4x4 in Discharge Instruction: Apply to wound bed as  instructed Secondary Dressing ABD Pad, 5x9 Discharge Instruction: Apply over primary dressing as directed. Secured With Compression Wrap Kerlix Roll 4.5x3.1 (in/yd) Discharge Instruction: Apply Kerlix and Coban compression , be sure to wrap from base of toes to tibial tuberosity. no too tight Coban Self-Adherent Wrap 4x5 (in/yd) Discharge Instruction: Apply over Kerlix as directed. Compression Stockings Add-Ons Wound #48 (Calcaneus) Wound Laterality: Right Cleanser Wound Cleanser Discharge Instruction: Cleanse the wound with wound cleanser prior to applying a clean dressing using gauze sponges, not tissue or cotton balls. Peri-Wound Care Sween Lotion (Moisturizing lotion) Discharge Instruction: Apply moisturizing lotion with dressing changes Topical Bactroban Discharge Instruction: apply to wound bed under alginate Primary Dressing KerraCel Ag Gelling Fiber Dressing, 4x5 in (silver alginate) Discharge Instruction: Apply silver alginate to wound bed as instructed Secondary Dressing Woven Gauze Sponge, Non-Sterile 4x4 in Discharge Instruction: Apply over primary dressing as directed. ABD Pad, 5x9 Discharge Instruction: Apply over primary dressing as directed. ALLEVYN Heel 4 1/2in x 5 1/2in / 10.5cm x 13.5cm Discharge Instruction: Apply over primary dressing as directed. Secured With Compression Wrap Kerlix Roll 4.5x3.1 (in/yd) Discharge Instruction: Apply Kerlix and Coban compression as directed. Coban Self-Adherent Wrap 4x5 (in/yd) Discharge Instruction: Apply over Kerlix as directed. Compression Stockings Add-Ons Electronic Signature(s) Signed: 06/02/2020 4:46:42 PM By: Zenaida Deed RN, BSN Signed: 06/02/2020 4:48:18 PM By: Baltazar Najjar MD Entered By: Baltazar Najjar on 06/02/2020 12:35:46 -------------------------------------------------------------------------------- Multi-Disciplinary Care Plan Details Patient Name: Date of Service: [name], [name] 06/02/2020 9:45  A M Medical Record Number: [id number] Patient Account Number: [id number] Date of Birth/Sex: Treating RN: 10/01/1960 (59 y.o. [name] Primary Care Kodi Guerrera: [name] Other Clinician: Referring Reeta Kuk: Treating Ramere Downs/Extender: [name], [name] in Treatment: 10 Active Inactive Wound/Skin Impairment Nursing Diagnoses: Knowledge deficit related to ulceration/compromised skin integrity Goals: Patient/caregiver will verbalize understanding of skin care regimen Date Initiated: 03/23/2020 Target Resolution Date: 06/22/2020 Goal Status: Active Ulcer/skin breakdown will have a volume reduction of 30% by week 4 Date Initiated: 03/31/2020 Date Inactivated: 04/28/2020 Target Resolution Date: 04/21/2020 Goal Status: Unmet Unmet Reason: infection, Ulcer/skin breakdown will have a volume reduction of 50% by  directed. Coban Self-Adherent Wrap 4x5 (in/yd) Discharge Instruction: Apply over Kerlix as directed. Compression Stockings Add-Ons Electronic Signature(s) Signed: 06/02/2020 4:46:42 PM By: Baruch Gouty RN, BSN Previous Signature: 06/02/2020 11:05:27 AM Version By: Sandre Kitty Entered By: Baruch Gouty on 06/02/2020 11:14:00 -------------------------------------------------------------------------------- Wound Assessment Details Patient Name: Date of Service: [name], [name] 06/02/2020 9:45 A M Medical Record Number: [id number] Patient Account Number: [id number] Date of Birth/Sex: Treating RN: [birth year] (60 y.o. [name] Primary Care Beauden Tremont: [name] Other Clinician: Referring Navi Ewton: Treating Damont Balles/Extender: Lance Sell, [name] in Treatment: 10 Wound Status Wound Number: 47 Primary Venous Leg Ulcer Etiology: Wound Location: Left, Medial Malleolus Wound Open Wounding Event: Gradually Appeared Status: Date Acquired: 05/22/2020 Comorbid Anemia, Deep Vein Thrombosis, Colitis, Osteomyelitis, Weeks Of Treatment: 1 History: Neuropathy, Quadriplegia, Confinement Anxiety Clustered Wound: No Wound Measurements Length: (cm) 2.2 Width: (cm) 2.5 Depth: (cm) 0.1 Area: (cm) 4.32 Volume: (cm) 0.432 % Reduction in Area: 43.9% % Reduction in Volume: 43.9% Epithelialization: Small (1-33%) Tunneling: No Undermining: No Wound Description Classification: Full Thickness Without Exposed Support Structures Wound Margin: Distinct, outline attached Exudate Amount: Medium Exudate Type: Serosanguineous Exudate Color: red, brown Foul  Odor After Cleansing: No Slough/Fibrino Yes Wound Bed Granulation Amount: Medium (34-66%) Exposed Structure Granulation Quality: Pink Fascia Exposed: No Necrotic Amount: Medium (34-66%) Fat Layer (Subcutaneous Tissue) Exposed: Yes Necrotic Quality: Adherent Slough Tendon Exposed: No Muscle Exposed: No Joint Exposed: No Bone Exposed: No Treatment Notes Wound #47 (Malleolus) Wound Laterality: Left, Medial Cleanser Wound Cleanser Discharge Instruction: Cleanse the wound with wound cleanser prior to applying a clean dressing using gauze sponges, not tissue or cotton balls. Peri-Wound Care Sween Lotion (Moisturizing lotion) Discharge Instruction: Apply moisturizing lotion with dressing changes Topical Primary Dressing Hydrofera Blue Classic Foam, 4x4 in Discharge Instruction: Apply to wound bed as instructed Secondary Dressing ABD Pad, 5x9 Discharge Instruction: Apply over primary dressing as directed. Secured With Compression Wrap Kerlix Roll 4.5x3.1 (in/yd) Discharge Instruction: Apply Kerlix and Coban compression , be sure to wrap from base of toes to tibial tuberosity. no too tight Coban Self-Adherent Wrap 4x5 (in/yd) Discharge Instruction: Apply over Kerlix as directed. Compression Stockings Add-Ons Electronic Signature(s) Signed: 06/02/2020 4:46:42 PM By: Baruch Gouty RN, BSN Previous Signature: 06/02/2020 11:05:27 AM Version By: Sandre Kitty Entered By: Baruch Gouty on 06/02/2020 11:14:58 -------------------------------------------------------------------------------- Wound Assessment Details Patient Name: Date of Service: [name], [name] 06/02/2020 9:45 A M Medical Record Number: [id number] Patient Account Number: [id number] Date of Birth/Sex: Treating RN: [birth year] (60 y.o. [name] Primary Care Bravery Ketcham: [name] Other Clinician: Referring Pepe Mineau: Treating Taden Witter/Extender: Lance Sell, [name] in Treatment:  10 Wound Status Wound Number: 48 Primary Pressure Ulcer Etiology: Wound Location: Right Calcaneus Wound Open Wounding Event: Pressure Injury Status: Date Acquired: 06/02/2020 Comorbid Anemia, Deep Vein Thrombosis, Colitis, Osteomyelitis, Weeks Of Treatment: 0 History: Neuropathy, Quadriplegia, Confinement Anxiety Clustered Wound: No Wound Measurements Length: (cm) 1.5 Width: (cm) 3.5 Depth: (cm) 0.1 Area: (cm) 4.123 Volume: (cm) 0.412 % Reduction in Area: % Reduction in Volume: Epithelialization: None Tunneling: No Undermining: No Wound Description Classification: Unstageable/Unclassified Wound Margin: Flat and Intact Exudate Amount: Small Exudate Type: Serosanguineous Exudate Color: red, brown Wound Bed Granulation Amount: None Present (0%) Necrotic Amount: Large (67-100%) Necrotic Quality: Eschar Foul Odor After Cleansing: No Slough/Fibrino No Exposed Structure Fascia Exposed: No Fat Layer (Subcutaneous Tissue) Exposed: Yes Tendon Exposed: No Muscle Exposed: No Joint Exposed: No Bone Exposed: No Treatment Notes Wound #48 (Calcaneus) Wound Laterality: Right Cleanser Wound Cleanser Discharge  History: Neuropathy, Quadriplegia, Confinement Anxiety Clustered Wound: No Wound Measurements Length: (cm) 4.4 Width: (cm) 3 Depth: (cm) 0.1 Area: (cm) 10.367 Volume: (cm) 1.037 % Reduction in Area: -37.3% % Reduction in Volume: 54.2% Epithelialization: Small (1-33%) Tunneling: No Undermining: No Wound Description Classification: Category/Stage IV Wound Margin: Flat and Intact Exudate Amount: Medium Exudate  Type: Serosanguineous Exudate Color: red, brown Foul Odor After Cleansing: No Slough/Fibrino Yes Wound Bed Granulation Amount: Small (1-33%) Exposed Structure Granulation Quality: Pink Fascia Exposed: No Necrotic Amount: Large (67-100%) Fat Layer (Subcutaneous Tissue) Exposed: Yes Necrotic Quality: Adherent Slough Tendon Exposed: Yes Muscle Exposed: No Joint Exposed: No Bone Exposed: No Treatment Notes Wound #44 (Foot) Wound Laterality: Right, Lateral, Proximal Cleanser Wound Cleanser Discharge Instruction: Cleanse the wound with wound cleanser prior to applying a clean dressing using gauze sponges, not tissue or cotton balls. Peri-Wound Care Sween Lotion (Moisturizing lotion) Discharge Instruction: Apply moisturizing lotion with dressing changes Topical Bactroban Discharge Instruction: apply to wound bed under alginate Primary Dressing KerraCel Ag Gelling Fiber Dressing, 4x5 in (silver alginate) Discharge Instruction: Apply silver alginate to wound bed as instructed Secondary Dressing Woven Gauze Sponge, Non-Sterile 4x4 in Discharge Instruction: Apply over primary dressing as directed. ABD Pad, 5x9 Discharge Instruction: Apply over primary dressing as directed. Secured With Compression Wrap Kerlix Roll 4.5x3.1 (in/yd) Discharge Instruction: Apply Kerlix and Coban compression as directed. Coban Self-Adherent Wrap 4x5 (in/yd) Discharge Instruction: Apply over Kerlix as directed. Compression Stockings Add-Ons Electronic Signature(s) Signed: 06/02/2020 4:46:42 PM By: Zenaida DeedBoehlein, Linda RN, BSN Previous Signature: 06/02/2020 11:05:27 AM Version By: Karl Itoawkins, Destiny Entered By: Zenaida DeedBoehlein, Linda on 06/02/2020 11:13:30 -------------------------------------------------------------------------------- Wound Assessment Details Patient Name: Date of Service: [name], [name] 06/02/2020 9:45 A M Medical Record Number: [id number] Patient Account Number: [id number] Date of  Birth/Sex: Treating RN: 05/13/1961 (60 y.o. [name] SchoonerM) Boehlein, Linda Primary Care Mateo Overbeck: Jamison OkaBakare, Mobolaji Other Clinician: Referring Yostin Malacara: Treating Primo Innis/Extender: Alethia Bertholdobson, Michael Bakare, [name] in Treatment: 10 Wound Status Wound Number: 46 Primary Pressure Ulcer Etiology: Wound Location: Right T Second oe Wound Open Wounding Event: Gradually Appeared Status: Date Acquired: 03/31/2020 Comorbid Anemia, Deep Vein Thrombosis, Colitis, Osteomyelitis, Weeks Of Treatment: 9 History: Neuropathy, Quadriplegia, Confinement Anxiety Clustered Wound: No Wound Measurements Length: (cm) 0.8 Width: (cm) 0.7 Depth: (cm) 0.1 Area: (cm) 0.44 Volume: (cm) 0.044 % Reduction in Area: 27.3% % Reduction in Volume: 26.7% Epithelialization: Medium (34-66%) Tunneling: No Undermining: No Wound Description Classification: Category/Stage III Wound Margin: Flat and Intact Exudate Amount: Small Exudate Type: Serosanguineous Exudate Color: red, brown Foul Odor After Cleansing: No Slough/Fibrino Yes Wound Bed Granulation Amount: Large (67-100%) Exposed Structure Granulation Quality: Red, Pink Fascia Exposed: No Necrotic Amount: Small (1-33%) Fat Layer (Subcutaneous Tissue) Exposed: Yes Necrotic Quality: Adherent Slough Tendon Exposed: No Muscle Exposed: No Joint Exposed: No Bone Exposed: No Treatment Notes Wound #46 (Toe Second) Wound Laterality: Right Cleanser Wound Cleanser Discharge Instruction: Cleanse the wound with wound cleanser prior to applying a clean dressing using gauze sponges, not tissue or cotton balls. Peri-Wound Care Sween Lotion (Moisturizing lotion) Discharge Instruction: Apply moisturizing lotion with dressing changes Topical Bactroban Discharge Instruction: apply to wound bed under alginate Primary Dressing KerraCel Ag Gelling Fiber Dressing, 4x5 in (silver alginate) Discharge Instruction: Apply silver alginate to wound bed as instructed Secondary  Dressing Woven Gauze Sponge, Non-Sterile 4x4 in Discharge Instruction: Apply over primary dressing as directed. ABD Pad, 5x9 Discharge Instruction: Apply over primary dressing as directed. Secured With Compression Wrap Kerlix Roll 4.5x3.1 (in/yd) Discharge Instruction: Apply Kerlix and Coban compression as  week 8 Date Initiated: 04/28/2020 Date Inactivated: 05/22/2020 Target Resolution Date: 05/19/2020 Goal Status: Unmet Unmet Reason: eschar, infection Ulcer/skin breakdown will have a volume reduction of 80% by week 12 Date Initiated: 05/22/2020 Target Resolution Date: 06/16/2020 Goal Status: Active Interventions: Assess patient/caregiver ability to perform ulcer/skin care regimen upon admission and as needed Provide education on ulcer and skin care Treatment Activities: Skin care regimen initiated : 03/23/2020 Topical wound management initiated : 03/23/2020 Notes: Electronic Signature(s) Signed: 06/02/2020 4:46:42 PM By: Zenaida Deed RN, BSN Entered By: Zenaida Deed on 06/02/2020 11:21:45 -------------------------------------------------------------------------------- Pain Assessment Details Patient Name: Date of Service: [name], [name] 06/02/2020 9:45 A M Medical Record Number: [id number] Patient Account Number: [id number] Date of Birth/Sex: Treating RN: Aug 30, 1960 (60 y.o. [name] Primary Care Cataleia Gade: [name] Other Clinician: Referring Majour Frei: Treating Holleigh Crihfield/Extender: [name], [name] in Treatment: 10 Active Problems Location of Pain Severity and Description of Pain Patient Has Paino Yes Site Locations Rate the pain. Current Pain Level: 5 Least Pain Level: 0 Character of Pain Describe the Pain: Aching Pain Management and Medication Current Pain Management: Medication: Yes Is the Current Pain Management Adequate: Adequate How does your wound impact your activities of daily livingo Sleep: Yes Bathing: No Appetite: No Relationship With Others: No Bladder Continence: No Emotions: No Bowel Continence: No Work: No Toileting: No Drive: No Dressing: No Hobbies: No Electronic Signature(s) Signed: 06/02/2020 4:46:42 PM By: Zenaida Deed RN, BSN Entered By: Zenaida Deed on 06/02/2020 11:04:09 -------------------------------------------------------------------------------- Patient/Caregiver Education Details Patient Name: Date of Service: [name] Rhine 1/7/2022andnbsp9:45 A M Medical Record Number: [id number] Patient Account Number: [id number] Date of Birth/Gender: Treating RN: 10-Mar-1961 (60 y.o. [name] Primary Care Physician: [name] Other Clinician: Referring Physician: Treating Physician/Extender: Lamarr Lulas in Treatment: 10 Education Assessment Education Provided To: Patient Education Topics Provided Infection: Methods: Explain/Verbal Responses: Reinforcements needed, State content correctly Offloading: Methods: Explain/Verbal Responses: Reinforcements needed, State content correctly Pressure: Methods: Explain/Verbal Responses: Reinforcements needed, State content correctly Wound/Skin Impairment: Methods: Explain/Verbal Responses: Reinforcements needed, State content correctly Electronic Signature(s) Signed: 06/02/2020 4:46:42 PM By: Zenaida Deed RN, BSN Entered By:  Zenaida Deed on 06/02/2020 11:22:57 -------------------------------------------------------------------------------- Wound Assessment Details Patient Name: Date of Service: [name], [name] 06/02/2020 9:45 A M Medical Record Number: [id number] Patient Account Number: [id number] Date of Birth/Sex: Treating RN: 1960/08/25 (60 y.o. [name] Primary Care Edouard Gikas: [name] Other Clinician: Referring Sihaam Chrobak: Treating Eiden Bagot/Extender: [name], [name] in Treatment: 10 Wound Status Wound Number: 40 Primary Venous Leg Ulcer Etiology: Wound Location: Left, Medial Lower Leg Wound Healed - Epithelialized Wounding Event: Gradually Appeared Status: Date Acquired: 02/21/2020 Comorbid Anemia, Deep Vein Thrombosis, Colitis, Osteomyelitis, Weeks Of Treatment: 10 History: Neuropathy, Quadriplegia, Confinement Anxiety Clustered Wound: No Wound Measurements Length: (cm) Width: (cm) Depth: (cm) Area: (cm) Volume: (cm) 0 % Reduction in Area: 100% 0 % Reduction in Volume: 100% 0 Epithelialization: Large (67-100%) 0 Tunneling: No 0 Undermining: No Wound Description Classification: Full Thickness Without Exposed Support Structures Wound Margin: Flat and Intact Exudate Amount: None Present Foul Odor After Cleansing: No Slough/Fibrino Yes Wound Bed Granulation Amount: None Present (0%) Exposed Structure Necrotic Amount: None Present (0%) Fascia Exposed: No Fat Layer (Subcutaneous Tissue) Exposed: No Tendon Exposed: No Muscle Exposed: No Joint Exposed: No Bone Exposed: No Electronic Signature(s) Signed: 06/02/2020 4:46:42 PM By: Zenaida Deed RN, BSN Previous Signature: 06/02/2020 11:05:27 AM Version By: Karl Ito Entered By: Zenaida Deed on 06/02/2020 11:09:00 -------------------------------------------------------------------------------- Wound Assessment Details  week 8 Date Initiated: 04/28/2020 Date Inactivated: 05/22/2020 Target Resolution Date: 05/19/2020 Goal Status: Unmet Unmet Reason: eschar, infection Ulcer/skin breakdown will have a volume reduction of 80% by week 12 Date Initiated: 05/22/2020 Target Resolution Date: 06/16/2020 Goal Status: Active Interventions: Assess patient/caregiver ability to perform ulcer/skin care regimen upon admission and as needed Provide education on ulcer and skin care Treatment Activities: Skin care regimen initiated : 03/23/2020 Topical wound management initiated : 03/23/2020 Notes: Electronic Signature(s) Signed: 06/02/2020 4:46:42 PM By: Zenaida Deed RN, BSN Entered By: Zenaida Deed on 06/02/2020 11:21:45 -------------------------------------------------------------------------------- Pain Assessment Details Patient Name: Date of Service: [name], [name] 06/02/2020 9:45 A M Medical Record Number: [id number] Patient Account Number: [id number] Date of Birth/Sex: Treating RN: Aug 30, 1960 (60 y.o. [name] Primary Care Cataleia Gade: [name] Other Clinician: Referring Majour Frei: Treating Holleigh Crihfield/Extender: [name], [name] in Treatment: 10 Active Problems Location of Pain Severity and Description of Pain Patient Has Paino Yes Site Locations Rate the pain. Current Pain Level: 5 Least Pain Level: 0 Character of Pain Describe the Pain: Aching Pain Management and Medication Current Pain Management: Medication: Yes Is the Current Pain Management Adequate: Adequate How does your wound impact your activities of daily livingo Sleep: Yes Bathing: No Appetite: No Relationship With Others: No Bladder Continence: No Emotions: No Bowel Continence: No Work: No Toileting: No Drive: No Dressing: No Hobbies: No Electronic Signature(s) Signed: 06/02/2020 4:46:42 PM By: Zenaida Deed RN, BSN Entered By: Zenaida Deed on 06/02/2020 11:04:09 -------------------------------------------------------------------------------- Patient/Caregiver Education Details Patient Name: Date of Service: [name] Rhine 1/7/2022andnbsp9:45 A M Medical Record Number: [id number] Patient Account Number: [id number] Date of Birth/Gender: Treating RN: 10-Mar-1961 (60 y.o. [name] Primary Care Physician: [name] Other Clinician: Referring Physician: Treating Physician/Extender: Lamarr Lulas in Treatment: 10 Education Assessment Education Provided To: Patient Education Topics Provided Infection: Methods: Explain/Verbal Responses: Reinforcements needed, State content correctly Offloading: Methods: Explain/Verbal Responses: Reinforcements needed, State content correctly Pressure: Methods: Explain/Verbal Responses: Reinforcements needed, State content correctly Wound/Skin Impairment: Methods: Explain/Verbal Responses: Reinforcements needed, State content correctly Electronic Signature(s) Signed: 06/02/2020 4:46:42 PM By: Zenaida Deed RN, BSN Entered By:  Zenaida Deed on 06/02/2020 11:22:57 -------------------------------------------------------------------------------- Wound Assessment Details Patient Name: Date of Service: [name], [name] 06/02/2020 9:45 A M Medical Record Number: [id number] Patient Account Number: [id number] Date of Birth/Sex: Treating RN: 1960/08/25 (60 y.o. [name] Primary Care Edouard Gikas: [name] Other Clinician: Referring Sihaam Chrobak: Treating Eiden Bagot/Extender: [name], [name] in Treatment: 10 Wound Status Wound Number: 40 Primary Venous Leg Ulcer Etiology: Wound Location: Left, Medial Lower Leg Wound Healed - Epithelialized Wounding Event: Gradually Appeared Status: Date Acquired: 02/21/2020 Comorbid Anemia, Deep Vein Thrombosis, Colitis, Osteomyelitis, Weeks Of Treatment: 10 History: Neuropathy, Quadriplegia, Confinement Anxiety Clustered Wound: No Wound Measurements Length: (cm) Width: (cm) Depth: (cm) Area: (cm) Volume: (cm) 0 % Reduction in Area: 100% 0 % Reduction in Volume: 100% 0 Epithelialization: Large (67-100%) 0 Tunneling: No 0 Undermining: No Wound Description Classification: Full Thickness Without Exposed Support Structures Wound Margin: Flat and Intact Exudate Amount: None Present Foul Odor After Cleansing: No Slough/Fibrino Yes Wound Bed Granulation Amount: None Present (0%) Exposed Structure Necrotic Amount: None Present (0%) Fascia Exposed: No Fat Layer (Subcutaneous Tissue) Exposed: No Tendon Exposed: No Muscle Exposed: No Joint Exposed: No Bone Exposed: No Electronic Signature(s) Signed: 06/02/2020 4:46:42 PM By: Zenaida Deed RN, BSN Previous Signature: 06/02/2020 11:05:27 AM Version By: Karl Ito Entered By: Zenaida Deed on 06/02/2020 11:09:00 -------------------------------------------------------------------------------- Wound Assessment Details

## 2020-06-06 ENCOUNTER — Other Ambulatory Visit: Payer: Self-pay | Admitting: Family Medicine

## 2020-06-16 ENCOUNTER — Encounter (HOSPITAL_BASED_OUTPATIENT_CLINIC_OR_DEPARTMENT_OTHER): Payer: Medicare Other | Admitting: Internal Medicine

## 2020-06-16 ENCOUNTER — Other Ambulatory Visit: Payer: Self-pay | Admitting: Family Medicine

## 2020-06-19 ENCOUNTER — Other Ambulatory Visit: Payer: Self-pay | Admitting: Family Medicine

## 2020-06-23 ENCOUNTER — Encounter (HOSPITAL_BASED_OUTPATIENT_CLINIC_OR_DEPARTMENT_OTHER): Payer: Medicare Other | Admitting: Internal Medicine

## 2020-06-23 ENCOUNTER — Other Ambulatory Visit: Payer: Self-pay

## 2020-06-23 DIAGNOSIS — T8131XA Disruption of external operation (surgical) wound, not elsewhere classified, initial encounter: Secondary | ICD-10-CM | POA: Diagnosis not present

## 2020-06-23 NOTE — Progress Notes (Signed)
KAIMEN, PEINE ([id number]) Visit Report for 06/23/2020 Debridement Details Patient Name: Date of Service: [name], [name] 06/23/2020 10:45 A M Medical Record Number: [id number] Patient Account Number: [id number] Date of Birth/Sex: Treating RN: 1960-08-13 (60 y.o. [name] Primary Care Provider: [name] Other Clinician: Referring Provider: Treating Provider/Extender: Marco Collie Weeks in Treatment: 13 Debridement Performed for Assessment: Wound #48 Right Calcaneus Performed By: Physician Maxwell Caul., MD Debridement Type: Debridement Level of Consciousness (Pre-procedure): Awake and Alert Pre-procedure Verification/Time Out Yes - 11:35 Taken: Start Time: 11:35 Pain Control: Other : benzocaine 20% spray T Area Debrided (L x W): otal 3.2 (cm) x 5 (cm) = 16 (cm) Tissue and other material debrided: Viable, Non-Viable, Eschar, Slough, Subcutaneous, Slough Level: Skin/Subcutaneous Tissue Debridement Description: Excisional Instrument: Blade Bleeding: Minimum Hemostasis Achieved: Pressure End Time: 11:37 Procedural Pain: 4 Post Procedural Pain: 3 Response to Treatment: Procedure was tolerated well Level of Consciousness (Post- Awake and Alert procedure): Post Debridement Measurements of Total Wound Length: (cm) 3.2 Stage: Category/Stage III Width: (cm) 5 Depth: (cm) 0.1 Volume: (cm) 1.257 Character of Wound/Ulcer Post Debridement: Requires Further Debridement Post Procedure Diagnosis Same as Pre-procedure Electronic Signature(s) Signed: 06/23/2020 5:33:44 PM By: Baltazar Najjar MD Signed: 06/23/2020 5:52:16 PM By: Zenaida Deed RN, BSN Entered By: Baltazar Najjar on 06/23/2020 12:26:19 -------------------------------------------------------------------------------- HPI Details Patient Name: Date of Service: [name]. 06/23/2020 10:45 A M Medical Record Number: [id number] Patient Account Number: [id number] Date of  Birth/Sex: Treating RN: [birth year] (59 y.o. [name] Primary Care Provider: [name] Other Clinician: Referring Provider: Treating Provider/Extender: [name], [name] in Treatment: 13 History of Present Illness HPI Description: 11/23/15; this is a patient I remember from a previous stay in this clinic in 2012. The patient says this was for a wound in this same area as currently although I have no information to verify that. Most of the information is provided by his caregiver from the group home where he resides. Apparently he has had a wound in this area for 8 months. He also has erythema around this area and has had multiple rounds of antibiotics apparently with some improvement but the erythema is persists. He apparently had a fracture in the ankle 2 months ago and was seen by Dr. Lajoyce Corners . He apparently had a splint applied and more recently a heel offloading boot. He had home health coming out to a week ago not clear what they are dressing this with but they apparently discharged him perhaps because of one of Dr. 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 He has a Nurse, adult for transferring at home he is nonambulatory and does not wear footwear. He is not a diabetic. Looking through  link he appears to have fairly active primary care. The area on the ankle is variably labeled as a pressure area and/or chronic venous insufficiency. He had a recent venous ultrasound on 5/25 that did not show a DVT in the left leg however this was not a reflux study. There was no superficial DVT either. Arterial studies on 10/15/14 showed a left ABI of 1.16 Center right of 1.12 waveforms were triphasic he does not have significant arterial disease. He has had recent x-rays of the left foot and ankle. The left foot did not show any abnormality. Left ankle x-ray showed a spiral fracture of the left tibial diaphysis without evidence  of osteomyelitis. 11/30/15 culture I did last week showed pseudomonas I changed him  to oral ciprofloxacin. Erythema is a lot better. 12/07/15 area around the wound shows considerable improvement of the periwound erythema. 12/14/15; the patient has a improvement of the periwound erythema which in retrospect I think with cellulitis successfully treated. We have been using Iodoflex started last week 12/25/15 wound on the left medial malleolus and dorsal left foot. Theraskin #1 applied READMISSION 02/27/16; the patient was admitted to hospital from 8/14 through 01/18/16 initially felt to have cellulitis of his left lower leg however he was noted to have significant persistent pain and swelling. A duplex ultrasound was ordered that was negative. Obie Dredge an x-ray was done that showed a spiral fracture of the left leg. With posterior displacement and angulation at the mid left tibia. Orthopedics saw the patient and splinted the leg. He was then sent to [facility] skilled facility and only return to his group home last week. He has wounds on the left medial malleolus, left dorsal foot and a worrisome area on the plantar left heel which is not really open but may represent a hematoma or a DTI 03/06/16; areas of left anterior and medial ankle both look improved. There are 3 small wounds here. He has a area over the dorsal left first metatarsal head which is not open. In meantime his left heel has opened 03/14/16 the medial left ankle wounds looks stable to improved. The area on the posterior left heel has opened up and had a very adherent eschar and slept over this 03/21/16 patient has 3 wounds on his left medial ankle is stable area on his left dorsal first metatarsal head appears to be improved. Continued problems with adherent eschar over the left heel 03/28/16; patient has 3 wounds on his left medial ankle and lower leg all of these appear to be stable. The problem continues to be the left heel and the  adherent eschar. This may have been a pressure ulcer from her brace at one point. He has a English as a second language teacher we have been using [employer] and all wound areas. Dressings are being changed by home health 04/04/16; the patient's areas on the medial aspect of the left ankle looks superficial and appear to be gradually closing. The area on the plantar aspects/Achilles aspect of his left heel as surface slough and nonviable subcutaneous tissue requiring debridement. 04/11/16; the areas on the patient's medial left ankle appear to be superficial and appear to be closing. The area on the plantar heel on the left has surface slough and nonviable subcutaneous tissue requiring debridement. I don't think this is too much different from last week there is also undermining 04/25/16; patient is followed in his group home by [facility] health. The area on the plantar heel is smaller but with still with some depth. We're using Iodoflex to this. The areas on the left ankle looks healthy but dry and somewhat larger. Cause of this is not really clear using Prisma 05/02/16; areas x3 all look improved expecially the areas on the medial ankle. Heel wound is smaller but still deep 05/09/16; all areas 3 look improved on the medial ankle. Heel wound is also contracting. 12/0.9/17 the areas on his left medial ankle are all closed. I think these are largely chronic venous insufficiency however there is no edema. The area on his left heel was a pressure area. READMISSION 08/02/16; this man is a patient that we have had in this clinic on several times before. I believe he has cerebral palsy is nonambulatory. He has chronic venous insufficiency with  venous inflammation. He was last year in the fall of 2017 with areas on his left medial ankle left heel and left dorsal foot. He arrives today with once again a vague history. As mentioned he is nonambulatory he spends most of his day in a pressure offloading boot nevertheless they noted  wounds in this area on the medial left ankle 2 weeks ago. There is any other history here I am uncertain 08/15/16; the patient's wounds on his medial ankle actually look better however the area on the left lateral/dorsal foot is covered in black necrotic eschar. Previous formal arterial studies did not show significant arterial disease with normal ABIs and triphasic waveforms. He is immobilized and at risk for pressure although he seems aware protective boots at all times as far as I am aware. 08/22/16; in general his wounds look better. All the wounds on his medial left ankle look improved the area on his lateral foot however continues to be problematic. Patient comes in the clinic today in a new wheelchair he is quite delighted 09/05/16; a wound on the left medial Calf and left dorsal foot are healed. The area on the left lateral foot is improved however the area on the left medial malleolus is inflamed with a considerable degree of surrounding erythema. This looks like cellulitis. ABIs and clinical exam are within normal limits 09/12/16; the patient continues to have a wound on the left medial calf. I was concerned about cellulitis in this area last week and gave him a course of doxycycline. The wound looks better here. The area on the left lateral foot however is unchanged 09/20/16; the patient came in this week early after traumatizing his toe on the left foot on a table while moving around in his motorized wheelchair. 09/26/16- he is here for follow-up evaluation of his left foot and malleolus ulcers. Since his appointment he abraded his left medial hallux against a table while in his motorized wheelchair, apparently trying to pull up to the table for a meal. He continues to complain of tenderness to the left lateral foot, surrounding the current ulcer 10/07/16; left foot and malleolus ulcers. 10/14/16; the patient is doing well. He has a wound on the left lateral, left medial malleolus and atraumatic  wound on the left great toe. Although these appear to be superficial and closing 10/31/16; the patient is doing well. The area on the left lateral foot, left medial malleolus and the left great toe or all epithelialized. He has home health 11/14/16; patient arrives today with the area on the left great toe healed. The left lateral foot however in the left medial malleolus and the ankle superficial wounds that are slightly larger. He has erythema and tenderness around the area on the left lateral foot. As well he is complaining of dysuria. His caregiver notes that when he gets like this he is more lethargic which indeed seems to be the case today 11/21/16; left great toe is still healed. His wound on the left lateral foot appears healthy and slightly smaller also the area on the left medial ankle. This had black eschar on it which I removed. The remaining wound looks satisfactory 12/12/16; patient hasn't been here in a number of weeks. The area on the left lateral foot is healed however on his medial ankle has 2 open areas one of them covered with a necrotic eschar. He also has a new wound on the right second toe which was apparently trauma induced while he was at a movie  12/27/16; the patient has wounds on his left medial mallelus and left lateral foot that are just about closed. The area on the right second toe which was trauma from last time is still open but again everything looks a lot better here. 01/30/17; patient arrives in clinic today and is really been a long time since I've seen him. The wounds are all epithelialized on the left foot this includes his left lateral foot, left medial malleolus. Apparently was in the ER for what of I think was a fungal infection on his buttock's he was prescribed Diflucan and doxycycline I think this is already getting better. READMISSION T is a now 60 year old man who has advanced cerebral palsy and functional quadriplegia. We have had him in this clinic multiple  times before for wounds erry on his right ankle, left ankle, left dorsal foot and most recently from 08/02/16 through 01/30/17 with his left medial ankle and left foot and finally right second toe. His attendant from the group home where he lives is very knowledgeable about him. She states that they noticed breakdown in his skin in his feet sometime in January although they could not get an appointment until today in this clinic. The previous wounds have always been felt to be secondary to incidental trauma/pressure areas or possibly some degree of chronic venous insufficiency. He had normal arterial studies in may of 2016. He has definite open wounds on the right lateral foot over the base of the fifth metatarsal, the right second toe DIP, the left medial malleolus. He has concerning areas over the left lateral fifth metatarsal base, retaform purpura over the medial left ankle and perhaps the medial left first toe. 07/21/17; culture we did last week from the base of the right fifth metatarsal grew group G strep. This should've been sensitive to the doxycycline I gave him. The other major wounds are on the right second toe DIP, left medial malleolus, base of the left lateral fifth metatarsal and the medial left ankle and medial left toe. 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 We have macrovascular arterial studies scheduled for Thursday although I don't think this is what the problem is. 07/28/17; patient arrives with really no improvement. He has a wound on the right second toe, lateral right foot, right medial malleolus which was new this week, left medial malleolus, but looked like a DTI on the left great toe. His macrovascular studies are on 07/30/17 although I would be surprised if this provides all the explanation. He has small areas even on his right second and third toe which look like splinter-type hemorrhages. The question I have is where is this coming from. I'd like to see the  vascular studies however before we pursue this. He has had a DVT in the past but is no longer on anticoagulants. I'll need to review his history 08/04/17; the patient had his ARTERIAL STUDIES this showed a right ABI of 1.16 and the left ABI of 1.20. TBI on the left at 0.84. He is at a right first toe amputation. The studies were unchanged from studies in May 2016. It was not felt that he had significant lower extremity arterial disease In the meantime we are using silver alginate to the wounds on the right second toe right lateral foot left medial malleolus. I'm going to change to Silver collagen today. 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 Working  him up for this would include an echocardiogram probably a CT scan of the chest and abdomen to look at the major thoracic and abdominal aortic vessels. I am not going to involve myself in this unless there is more symptoms/signs 08/18/17; the patient arrives today with wounds looking somewhat better. The remaining areas are the right second toe dorsal, right lateral foot and left medial malleolus which only has a small open area. We've been using silver collagen 09/01/17;right second toe dorsally probes to bone today. This is a deterioration. We've been using silver collagen. Only a small open area remains on the left medial malleolus. 09/09/17; right second toe appears better. There is no probing bone. He has a small area remaining on the left medial malleolus, the right lateral foot. We have been using silver collagen 10/07/17; the patient returns to clinic today after a month hiatus. He has wounds over the right second toe, right lateral foot and the left medial malleolus. We have been using silver collagen. He has home health. 10/24/17; patient has open wounds over the right second toe, now the left fourth toe and a superficial  area over his left buttock. Some of the other areas have healed including the left medial malleolus and right lateral foot. Using silver collagen on these wounds 11/07/17; the patient has closed his wounds on the right second toe and left fourth toe although they look all normal. Last time he was here he had a superficial area over the left buttock this is also closed. The right lateral foot wound I had closed out last time although it is seems to have reopened today. 628/19 the areas over the right second and left fourth toe remains closed. The right lateral foot has tightly adherent necrotic surface over. We have been using silver collagen he offloads this and a bunny boot 12/12/17; the patient has reopened the recurrent area over the right second PIP joint. 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 He does not have obvious macrovascular disease. The area over the right lateral foot looks about the same as last time tightly adherent necrotic debris. We've been using Hydrofera Blue 12/25/17; 2 week follow-up. The area over the right second PIP is closed however the area on the right lateral foot is worse with surrounding erythema and pain compatible with cellulitis is going to need an antibiotic. AS WELL..... We have looked at his buttocks and lower back. He has 3 stage II ulcers. The exact history here is unclear however he is very disabled man with cerebral palsy. He goes to a day program from the group home in which she lives 5 days a week and I think he is on his buttock all day on these visits. Surrounding the wounds see has extensive stage I injury as well. FINALLY it looks as though he has lost weight. I'm not the only staff member that is recognized this 01/08/18 on evaluation today patient appears to be doing better in regard to his gluteal wounds. In fact everything appears to be healed back here although very dry. He has a couple open  sores on the scrotal region other than this it is really his foot that is still giving him the most trouble. He did complete the Keflex unfortunately it seems like there's still a lot of erythema in this in fact looks worse than it did previous. I'm gonna culture this today and likely start with a different antibiotic.  01/16/18; I was reassured that the wounds on his buttocks and sacrum are healed. I did not look at these the day. The culture that was done last week on 01/11/18 of the right lateral foot wounds showed Pseudomonas. Bactrim is not a good choice for this. Ciprofloxacin interacts with his muscle relaxants therefore I prescribed cefdinir 300 twice a day for 7 days. Bactrim coverage of pseudomonas is not reliable although the degree of erythema looks a lot better 01/30/18; the patient has completed his antibiotics. The area does not have infection on the right lateral foot. The wounds look a lot better. We've been using silver alginate 02/12/18; there is on the right lateral foot. Copious amounts of surface eschar. Also similarly over the PIP of the right second toe. We've been using silver alginate to date 03/02/2018; the area that remains is on the right lateral foot. The right second toe is healed. 03/16/2018; the area there is original wound on the right lateral foot. Still requiring debridement. We are using Hydrofera Blue. The right second toe is still healed/closed 03/30/2018; right lateral foot there is 2 small open areas remaining. They actually look quite healthy we have been using Hydrofera Blue the right second toe remains closed 04/13/2018; right lateral foot. 1 of the 2 small open areas from last time has closed over. The other had surface slough that I removed with a #3 curette. This looks quite healthy. The right second toe remains closed. We are using Hydrofera Blue 05/01/2018; right lateral foot very superficial areas that look like they are trying to close over. He had a new  threatened area on the left medial ankle just below the medial malleolus. I think these represent chronic venous insufficiency areas 05/14/2018; the right right lateral foot looks like it closed over as was predicted last time however he has some dark areas subcutaneously erythema superiorly and this is really very tender. This is either coexistent cellulitis or perhaps a deep tissue injury. The area on the left medial ankle is superficial but has some size. We have been using Hydrofera Blue to both areas. The patient complains of pain in his right foot 06/09/2018; right lateral foot was closed over last time but there was coexistent cellulitis around this. I gave him Levaquin. He also has an area on the left medial malleolus. He has not been back to clinic in over 3-1/2 weeks. We are using Hydrofera Blue I think to the left medial malleolus and silver alginate to the right lateral foot 1/21; right lateral foot still non-viable debris over this area and the left medial malleolus. Both of these vigorously washed off with antiseptic gauze. We have been using silver alginate. 2/4; right lateral foot this looks somewhat better. Unfortunately the area over the left medial malleolus has deteriorated. Using silver alginate in both area 2/18; both wounds arrived today covered in a lot of necrotic debris. We have been using silver alginate. Clearly not making a lot of progress. 3/3; the patient's right second toe is reopened over the PIP. I am not sure why this is happening. He may have microvascular disease. We have checked his macrovascular flow in the past and found it to be normal. He may need an x-ray. The area on the right lateral foot requires debridement. The area on the left lateral ankle is worse. We have been using PolyMem Ag 08/26/18 on evaluation today patient appears to be doing a little worse in regard to his lower extremities based on what I'm seeing  what the nursing staff is telling me. His left  medial ankle appears potential to be infected there is your thing on want surrounding the wound. His right second toe is also of concern at this point in my pinion. We have not done x-rays of these areas that may be a good idea to go ahead and proceed with at this point to ensure will not deal with anything such as osteomyelitis. 4/16; it is been a while since I saw this patient. He now has wounds on the right lateral foot, dorsal right second toe, substantially on the left medial malleolus and a small area on the left lateral foot. We have been using PolyMem Silver. Curlex and Coban. He has home health changing the dressing. Since last time he was here he had a culture of the left medial ankle that showed MRSA. He is completed a course/7 days of doxycycline. XRAYS of the left ankle and right foot that were ordered were negative for osteomyelitis 4/30; he arrives in clinic today with some maceration around the wound and a lot of tenderness and erythema in a circumferential area around the right lateral foot. He has wound areas on the right lateral foot dorsal right second toe, largely over the left medial malleolus and a small area over the left lateral foot 5/7; completed his antibiotics from last time right lateral foot cellulitis is better and the area on his left knee which we discovered after he had been seen by the provider also is improved. He still has extensive wound areas over the right lateral foot, left medial malleolus, right second toe and left lateral foot although all of these look somewhat better with PolyMem 5/15. Wounds on the right lateral foot, right second toe left medial malleolus and the left lateral foot. All of these look somewhat better. We have been using PolyMem Ag 5/26; the wound on the right lateral foot is almost closed over but he still complains of a lot of pain in this area. He has a eschared area on the right second toe but no open wound.. Also concerning today his  original wound on the left medial malleolus looks healthy however just inferior to this almost areas of deep tissue injury and there is an area on the left lateral foot also looks like a deep tissue injury. In the past I have wondered whether he might have cholesterol emboli or some other form of a vasculopathy. I wonder whether this could have something to do with this. 6/2; right lateral foot wound is still open I gave him empiric antibiotics last week without really a lot of change she is still complaining of pain in this area. We x- rayed his feet at some point in April these were negative. It would be difficult to do an MRI. The area on his left medial malleolus looks better. Right second toe is healed. He still has what looks to be a small pressure related area on the left lateral foot but it is not open. We are using PolyMem to all wound which seems to be doing nicely 6/16; right lateral foot wound is still open. The wound looks better although there is still a lot of tenderness around it. Rather than more empiric antibiotics I have cultured this today. We are using PolyMem Ag. On the left lateral malleolus the wound appears better. He has a new area on the left lower anterior pretibial area 6/29; 2-week follow-up. Culture I did of the right lateral foot 2 weeks  ago showed MRSA. I gave him 2 weeks of Septra which he should be just about finishing now and this seems better. We got a call from home health last week to report swelling in the left leg because we were listed as his primary. We referred him back to primary care. He arrives today with intense erythema and tenderness around the 2 wounds on the left medial ankle on the left lower calf. The area on the right lateral foot looks better 7/7; I brought him back this week to follow-up on his left foot which had cellulitis last week. I gave him clindamycin which as far as I know he tolerated. The erythema is better. He has a 3 current wounds  one on the right lateral foot a small area on the right lateral malleolus and the more recent area on the left anterior pretibial area. 7/21; bilateral distal lower leg and foot wounds. The area on his right lateral foot looks a lot better. He has an area on his left pretibial area distally. The area on the medial malleolus on the left which is 1 of his worst wounds has closed over. He does not appear to have any open areas in his remaining toes 8/4-Patient returns at 2 weeks, he has the remaining wound on the left lower leg the other 2 wounds have healed. Patient is continued to be treated with silver alginate with a 2 layer wrap on the left 8/18; I been following this patient for a long period for bilateral lower extremity wounds. 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 His wounds are probably pressure although he religiously wears thick bunny boots. He does not have macrovascular disease but I did formal testing on him perhaps 2 or 3 years ago. I have often wondered about either cholesterol emboli or a vasculopathy of some sort given the distal nature of these variable locations etc. He has 2 areas that we have been following one on the left medial lower leg and one on the right lateral foot. Both of these are mostly healed today there is some eschar over sections of both areas although I did not disturb this. 03/31/2019 on evaluation today patient appears to be doing a little bit more poorly in regard to the sacral region. He has 2 small wounds noted at this point. Fortunately there is no signs of active infection at this time. No fevers, chills, nausea, vomiting, or diarrhea. He is having some pain when he sits on his gluteal region a big part of the reason he has these wounds is likely due to the fact that his wheelchair has not been functioning properly. He does still have a couple areas of deep tissue injury on his lower extremities he does  need new Prevalon offloading boots as well. 04/28/2019 on evaluation today patient appears to be doing well with regard to his wounds in particular. His ankle area has fairly small based on what I am seeing currently and his sacral region actually is still open but does not appear to be doing too poorly in my opinion. Fortunately there is no signs of active infection at this time. He was in the hospital unfortunately and this was from 04/08/2019 through 04/12/2019 secondary to persistent diarrhea, dehydration and acute kidney injury. With that being said fortunately the patient does not seem to be doing too poorly at this time which is good news. He is having pain mainly in the sacral region. 06/23/2019 on  evaluation today patient appears to be doing well with regard to his sacral region which in fact is completely healed. Unfortunately he has an area of rubbing/pressure on his right lateral chest/back region. This is where it is rubbing up on his chair. Subsequently he also has an open wound on the left medial malleolus which is not very open but starting to crack and then on the right lateral foot this is much more open at this point. Unfortunately there is no signs of significant improvement in regard to these areas and in fact there does appear to be evidence of active infection quite significantly. No fevers, chills, nausea, vomiting, or diarrhea. The patient did test positive for COVID-19 that has been greater than 14 days ago although they did not know the exact timing. That is why he was not here for his last appointment. 07/07/2019 upon evaluation today patient actually appears to be doing quite well in regard to his left ankle as well as the chest/back region which is doing great in fact appears to be healed in my opinion. He still has an issue on his lateral right foot that is open and appears to be infected but does have me more concerned. In fact I do believe the doxycycline helped in general  for the cellulitis but I believe that may have been more MRSA related based on the culture that we did. What I am seeing right now has more of the blue-green drainage and may be more consistent with a Pseudomonas that I was hoping was not really causing much of an issue but I think it may be at this point on the right lateral foot. For that reason I do think treatment would be a good idea of the reason I avoided the Cipro before was the potential for QT prolongation which could obviously cause problems with the patient with results of interaction with respiratory wound. The tizanidine also had some interactions as well. Nonetheless I think at this point that it would be a possibility for [place] to use topical gentamicin to see if this could be of benefit for the patient underneath the silver alginate dressing. 07/21/2019 upon evaluation today patient appears to be doing fairly well at this point in regard to his lower extremity on the right. He does have a wound on his right lateral foot as well as the right fifth toe and the right fourth toe. Unfortunately he continues to experience significant issues with pressure and trauma to some degree and I think this is just due to the fact that again he is not really not able to control his legs and what is happening here. Fortunately there is no signs of active infection at this time. 08/04/2019 upon evaluation today patient actually appears to be doing quite well with regard to his toe ulcers I am very pleased in that regard. Unfortunately the foot ulcer is showing some signs of erythema his primary care provider has placed him on clindamycin at this point. Again I do believe this can be beneficial for him. With that being said I do think the patient may benefit from a light Kerlix and Coban wrap to help with some of his edema at this point. 08/18/2019 upon evaluation today patient appears to be doing still poorly in regard to his lateral foot ulcer. This is not  very deep but there is some concern about the possibility of osteomyelitis. I think it would be appropriate for [place] to go ahead and have an x-ray at  this time. The patient is currently on clindamycin which from his culture which was performed on 06/23/2019 it does appear that he would benefit from that in regard to the MRSA. However Pseudomonas was also identified then and the patient actually has some blue-green drainage at this point as well. I am much more concerned about the possibility of the Pseudomonas being an infection is causative agent at this point that I am the MRSA. Subsequently Cipro the patient was sensitive to but we never use that is stable use a topical gent due to the fact that unfortunately there was some interaction between the Cipro and the patient's risk for down potentially causing QT prolongation. 08/31/2019; have not seen this patient in quite a period of time. He has a substantial wound on his right lateral foot we have been using gentamicin ointment with Hydrofera Blue over the top. From what I am able to determine he is also on antibiotics perhaps clindamycin although I have not had a chance to look over this. He also has an eschared area on the right fifth toe as well as the right second toe. 4/20; right lateral foot wound. He comes in today with intense erythema and tenderness around this wound. The wound itself has a surface on it but it doesn't look particularly healthy a lot of gelatinous debris. There is no palpable bone. We've been using gentamicin ointment and Hydrofera Blue. Since the patient was last here he was seen by Dr. Algis Liming of infectious disease. He ordered an MRI that I think is due later this month on 4/30. He apparently also checked a sedimentation rate and C-reactive protein metabolic panel CBC with differential. At the time he was seen he wanted to withhold any microbials pending identification of the depth of the infection although looking at this I  don't know that I'm going to be able to hold off for another 10 days. 4/29; he went on to have the MRI that was ordered by Dr. Algis Liming of infectious disease. 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 I had given him cefdinir last week for surrounding cellulitis he is finishing this today. I have communicated with Dr. Algis Liming who wants bone for pathology and culture before proceeding with consideration of IV antibiotics. Fortunately he is due to see Dr. Samuella Cota of podiatry today I am hopeful that the bone biopsy and culture will be considered there 5/4 as I understand things Dr. Samuella Cota actually wants to remove the diseased proximal fifth metatarsal. Looking back at this decision is probably not a bad one. He has good blood flow he is nonambulatory they are apparently going to do this next week. Paradoxically the wound on the lateral part of his foot is actually a lot better. The cellulitis that was so problematic 2 weeks ago also has resolved. READMISSION 03/23/2020 T is now a 60 year old man he has cerebral palsy and is functionally paraplegic. The last time I saw him he had a diseased proximal fifth metatarsal. 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 Samuella Cota. Culture of this showed Pseudomonas and he was followed by infectious disease Dr. Algis Liming treated with ciprofloxacin. 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 He had Integra placed. A CandS showed Pseudomonas again. Since then he has had Silvadene as a primary dressing, then Santyl and more recently Xeroform  and then back to Santyl 3 times a week per his attendant who comes with him. Reviewing his records I was really not prepared for the number of wounds that he currently has. This includes substantial areas on the right lateral foot, right lateral ankle, right fourth toe  dorsally, right ankle anteriorly as well as the left lateral ankle and foot. His medical history is unchanged ABIs 1.2 on the right and 1.21 on the left he has not been known to have arterial insufficiency and is not a diabetic 11/5; patient I readmitted to the clinic last week. He has multiple wounds on the right lateral foot, right fourth toe and a new area on the right second toe. He has areas on the right dorsal ankle and a substantive wound over the left lateral ankle and foot. We are using silver collagen. We put him under compression last week and this seems to have helped. He has home health coming out one other time per week to change the dressing 11/12; no real improvement. Multiple bilateral foot and ankle wounds. On the left he has a substantial wound on the left lateral ankle On the right he has wounds on the dorsal second and fourth toes. Right lateral foot we have merged to wounds these are necrotic She has an area on the right dorsal ankle and a area on the right medial malleolus with marked surrounding erythema likely cellulitis. The patient complains about pain that kept him awake all last night from wraps that were too tight. He is not known to have arterial insufficiency. 11/18; no real change. I gave him empiric Levaquin last week directed at previously cultured Pseudomonas this is not really helped. On the right he has wounds on the right lateral foot right lateral ankle right medial ankle and the dorsal second and fourth toes. Most of these do not have a nice have a healthy surface. There is erythema in his foot which may all be venous stasis. The area on the left lateral malleolus/ankle area looks better 12/3; really disappointing if anything deterioration especially on the right lateral foot. These are necrotic. He also has areas on the right dorsal ankle right medial malleolus and the right second and fourth toes. On the left he has a large area over the left medial ankle.  There is some erythema around the wound. Things are very tender I have reviewed his most recent arterial Dopplers which are actually in 2019 but he has never been felt to have arterial issues. At that time his ABI on the right was 1.16 [no TBI due to amputation] on the left his ABI was 1.20 with a great toe pressure of 0.24. Waveforms on the right and left toes were all normal he was not felt to have significant evidence of lower extremity arterial disease. ABIs in our clinic have always been normal his pulses have been palpable. He had an MRI of his right foot in April 2021 this showed a soft tissue ulceration along the lateral midfoot with underlying osteomyelitis no abscess. 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 He returned to our clinic on 03/23/2020. The same pattern of wounds that I am describing currently this was a lot worse than earlier in the year. I gave him empiric antibiotics because he had had Pseudomonas in the past this does not seem to have helped. We have not made any progress on any of these wound site. The patient is in a lot of pain 12/10; the  patient comes in with a new deep tissue injury on the right heel large necrotic wound on the right lateral foot, smaller necrotic wound on the dorsal ankle and medial ankle. Necrotic wounds on the second and fourth toes. We have been using Hydrofera Blue On the left he has the area on the left little medial ankle which actually looks some better healthier looking tissue. We are using Hydrofera Blue here as well X-ray ordered last week showed no osseous erosion no plain film indication of osteomyelitis. 12/17; PCR culture I did of the right lateral foot wound last week showed a large titer of Enterococcus faecalis and low titers of of coag negative staph and Peptostreptococcus. I have started him on Augmentin for 2 weeks but I may continue beyond this. He has continuous complaints of pain now apparently making it difficult for him  to rest at night. Substantial wound area in the right foot and ankle. Unfortunately the left medial foot seems to be more substantial this week. 12/27; in general the wounds on the right foot and ankle and the left medial ankle look better. There is certainly less erythema in the right foot. I have him on Augmentin and I am going to extend this for another week. MRI is booked for 06/01/2020 06/02/2020; patient's extensive skin breakdown on the right foot continues it is stable to perhaps slightly improved however there is a considerable amount of tissue breakdown. Unfortunately his MRI shows findings consistent with osteomyelitis in the lateral aspect of the cuboid. The overlying wound in this area is deep but less necrotic than I am used to seeing. He has area on the right posterior heel is now necrotic.. The area on the left lateral ankle/foot looks somewhat better. The patient states he continues to be in a lot of pain from the right foot and he is really tired of hurting from these wounds. I have discussed things with T before. He is anxious to be out of pain. He is nonambulatory. Furthermore the osteomyelitis in the right foot and the erry extensive wounds in this area are only a source of potential systemic infection. He seems in favor of proceeding with a below-knee amputation. I cannot argue with this we have not made a lot of progress in several months working on these wounds ever since his fifth ray amputation by Dr. Samuella Cota 1/28; in general the patient's right foot looks better although there is several wounds still present there were certainly look less angry. The area on the right lateral foot looks better he still has a necrotic area on the right Achilles heel but this is separating the right fourth toe and the right medial malleolus. We have been using silver alginate with Bactroban on all of these wounds. On the left medial ankle the wound looks healthy we have been using Hydrofera Blue. He  has an appointment I think on Monday with Dr. Victorino Dike with regards to a BKA. Although the wounds on the right foot look better than they did heat the patient still relates that he has occasional pain and he suffered with this through the years he would rather have a below-knee amputation. Provided Dr. Victorino Dike agrees with this he is anxious to proceed Electronic Signature(s) Signed: 06/23/2020 5:33:44 PM By: Baltazar Najjar MD Entered By: Baltazar Najjar on 06/23/2020 12:37:00 -------------------------------------------------------------------------------- Physical Exam Details Patient Name: Date of Service: [name], [name] 06/23/2020 10:45 A M Medical Record Number: [id number] Patient Account Number: [id number] Date of Birth/Sex: Treating RN: [birth year] (59  y.o. [name] Primary Care Provider: [name] Other Clinician: Referring Provider: Treating Provider/Extender: [name], [name] in Treatment: 13 Constitutional Sitting or standing Blood Pressure is within target range for patient.. Pulse regular and within target range for patient.Marland Kitchen Respirations regular, non-labored and within target range.. Temperature is normal and within the target range for the patient.Marland Kitchen Appears in no distress. Notes Wound exam On the left side debride the wound on the medial ankle looks better healthy surface. On the right the right medial malleolus wound again looks clean and healthier on the right posterior and necrotic wound that I cleaned up with a scalpel #15 good pickups hemostasis with direct pressure. Right lateral foot wound looks better and the area on the fourth toe also looks better the area of the second toe is Electronic Signature(s) Signed: 06/23/2020 5:33:44 PM By: Baltazar Najjar MD Entered By: Baltazar Najjar on 06/23/2020 12:38:00 -------------------------------------------------------------------------------- Physician Orders Details Patient Name: Date of  Service: [name], [name] 06/23/2020 10:45 A M Medical Record Number: [id number] Patient Account Number: [id number] Date of Birth/Sex: Treating RN: [birth year] (60 y.o. 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 Bathing/ Shower/ Hygiene May shower with protection but do not get wound dressing(s) wet. Other Bathing/Shower/Hygiene Orders/Instructions: - scrub legs gently with wash cloth and soap and water with dressing changes Edema Control - Lymphedema / SCD / Other Bilateral Lower Extremities Elevate legs to the level of the heart or above for 30 minutes daily and/or when sitting, a frequency of: Off-Loading Heel suspension boot to: - heel protectors at all times Other: - avoid any pressure to back of heels Home Health No change in wound care orders this week; continue Home Health for wound care. May utilize formulary equivalent dressing for wound treatment orders unless otherwise specified. Other Home Health Orders/Instructions: - [facility] Wound Treatment Wound #41 - T Fourth oe Wound Laterality: Right Cleanser: Soap and Water (Home Health) 2 x Per Week/30 Days Discharge Instructions: May shower and wash wound with dial antibacterial soap and water prior to dressing  change. Cleanser: Wound Cleanser (Home Health) 2 x Per Week/30 Days Discharge Instructions: Cleanse the wound with wound cleanser prior to applying a clean dressing using gauze sponges, not tissue or cotton balls. Peri-Wound Care: Sween Lotion (Moisturizing lotion) 2 x Per Week/30 Days Discharge Instructions: Apply moisturizing lotion with dressing changes Topical: Bactroban (Home Health) 2 x Per Week/30 Days Discharge Instructions: apply to wound bed under alginate Prim Dressing: KerraCel Ag Gelling Fiber Dressing, 4x5 in (silver alginate) (Home Health) 2 x Per Week/30 Days ary Discharge Instructions: Apply silver alginate to wound bed as instructed Secondary Dressing: Woven Gauze Sponge, Non-Sterile 4x4 in (Home Health) 2 x Per Week/30 Days Discharge Instructions: Apply over primary dressing as directed. Secondary Dressing: ABD Pad, 5x9 (Home Health) 2 x Per Week/30 Days Discharge Instructions: Apply over primary dressing as directed. Compression Wrap: Kerlix Roll 4.5x3.1 (in/yd) (Home Health) 2 x Per Week/30  Days Discharge Instructions: Apply Kerlix and Coban compression as directed. Compression Wrap: Coban Self-Adherent Wrap 4x5 (in/yd) (Home Health) 2 x Per Week/30 Days Discharge Instructions: Apply over Kerlix as directed. Wound #43 - Lower Leg Wound Laterality: Right, Anterior, Distal Cleanser: Soap and Water (Home Health) 2 x Per Week/30 Days Discharge Instructions: May shower and wash wound with dial antibacterial soap and water prior to dressing change. Cleanser: Wound Cleanser (Home Health) 2 x Per Week/30 Days Discharge Instructions: Cleanse the wound with wound cleanser prior to applying a clean dressing using gauze sponges, not tissue or cotton balls. Peri-Wound Care: Sween Lotion (Moisturizing lotion) 2 x Per Week/30 Days Discharge Instructions: Apply moisturizing lotion with dressing changes Topical: Bactroban (Home Health) 2 x Per Week/30 Days Discharge Instructions: apply  to wound bed under alginate Prim Dressing: KerraCel Ag Gelling Fiber Dressing, 4x5 in (silver alginate) (Home Health) 2 x Per Week/30 Days ary Discharge Instructions: Apply silver alginate to wound bed as instructed Secondary Dressing: Woven Gauze Sponge, Non-Sterile 4x4 in (Home Health) 2 x Per Week/30 Days Discharge Instructions: Apply over primary dressing as directed. Secondary Dressing: ABD Pad, 5x9 (Home Health) 2 x Per Week/30 Days Discharge Instructions: Apply over primary dressing as directed. Compression Wrap: Kerlix Roll 4.5x3.1 (in/yd) (Home Health) 2 x Per Week/30 Days Discharge Instructions: Apply Kerlix and Coban compression as directed. Compression Wrap: Coban Self-Adherent Wrap 4x5 (in/yd) (Home Health) 2 x Per Week/30 Days Discharge Instructions: Apply over Kerlix as directed. Wound #44 - Foot Wound Laterality: Right, Lateral, Proximal Cleanser: Soap and Water (Home Health) 2 x Per Week/30 Days Discharge Instructions: May shower and wash wound with dial antibacterial soap and water prior to dressing change. Cleanser: Wound Cleanser (Home Health) 2 x Per Week/30 Days Discharge Instructions: Cleanse the wound with wound cleanser prior to applying a clean dressing using gauze sponges, not tissue or cotton balls. Peri-Wound Care: Sween Lotion (Moisturizing lotion) 2 x Per Week/30 Days Discharge Instructions: Apply moisturizing lotion with dressing changes Topical: Bactroban (Home Health) 2 x Per Week/30 Days Discharge Instructions: apply to wound bed under alginate Prim Dressing: KerraCel Ag Gelling Fiber Dressing, 4x5 in (silver alginate) (Home Health) 2 x Per Week/30 Days ary Discharge Instructions: Apply silver alginate to wound bed as instructed Secondary Dressing: Woven Gauze Sponge, Non-Sterile 4x4 in (Home Health) 2 x Per Week/30 Days Discharge Instructions: Apply over primary dressing as directed. Secondary Dressing: ABD Pad, 5x9 (Home Health) 2 x Per Week/30  Days Discharge Instructions: Apply over primary dressing as directed. Compression Wrap: Kerlix Roll 4.5x3.1 (in/yd) (Home Health) 2 x Per Week/30 Days Discharge Instructions: Apply Kerlix and Coban compression as directed. Compression Wrap: Coban Self-Adherent Wrap 4x5 (in/yd) (Home Health) 2 x Per Week/30 Days Discharge Instructions: Apply over Kerlix as directed. Wound #47 - Malleolus Wound Laterality: Left, Medial Cleanser: Soap and Water (Home Health) 2 x Per Week/30 Days Discharge Instructions: May shower and wash wound with dial antibacterial soap and water prior to dressing change. Cleanser: Wound Cleanser (Home Health) 2 x Per Week/30 Days Discharge Instructions: Cleanse the wound with wound cleanser prior to applying a clean dressing using gauze sponges, not tissue or cotton balls. Peri-Wound Care: Sween Lotion (Moisturizing lotion) (Home Health) 2 x Per Week/30 Days Discharge Instructions: Apply moisturizing lotion with dressing changes Prim Dressing: Hydrofera Blue Classic Foam, 4x4 in (Home Health) 2 x Per Week/30 Days ary Discharge Instructions: Apply to wound bed as instructed Secondary Dressing: ABD Pad, 5x9 (Home Health) 2 x Per Week/30 Days Discharge  Instructions: Apply over primary dressing as directed. Compression Wrap: Kerlix Roll 4.5x3.1 (in/yd) (Home Health) 2 x Per Week/30 Days Discharge Instructions: Apply Kerlix and Coban compression , be sure to wrap from base of toes to tibial tuberosity. no too tight Compression Wrap: Coban Self-Adherent Wrap 4x5 (in/yd) (Home Health) 2 x Per Week/30 Days Discharge Instructions: Apply over Kerlix as directed. Wound #48 - Calcaneus Wound Laterality: Right Cleanser: Soap and Water (Home Health) 2 x Per Week/30 Days Discharge Instructions: May shower and wash wound with dial antibacterial soap and water prior to dressing change. Cleanser: Wound Cleanser (Home Health) 2 x Per Week/30 Days Discharge Instructions: Cleanse the wound  with wound cleanser prior to applying a clean dressing using gauze sponges, not tissue or cotton balls. Peri-Wound Care: Sween Lotion (Moisturizing lotion) 2 x Per Week/30 Days Discharge Instructions: Apply moisturizing lotion with dressing changes Topical: Bactroban (Home Health) 2 x Per Week/30 Days Discharge Instructions: apply to wound bed under alginate Prim Dressing: KerraCel Ag Gelling Fiber Dressing, 4x5 in (silver alginate) (Home Health) 2 x Per Week/30 Days ary Discharge Instructions: Apply silver alginate to wound bed as instructed Secondary Dressing: Woven Gauze Sponge, Non-Sterile 4x4 in (Home Health) 2 x Per Week/30 Days Discharge Instructions: Apply over primary dressing as directed. Secondary Dressing: ABD Pad, 5x9 (Home Health) 2 x Per Week/30 Days Discharge Instructions: Apply over primary dressing as directed. Secondary Dressing: ALLEVYN Heel 4 1/2in x 5 1/2in / 10.5cm x 13.5cm (Home Health) 2 x Per Week/30 Days Discharge Instructions: Apply over primary dressing as directed. Compression Wrap: Kerlix Roll 4.5x3.1 (in/yd) (Home Health) 2 x Per Week/30 Days Discharge Instructions: Apply Kerlix and Coban compression as directed. Compression Wrap: Coban Self-Adherent Wrap 4x5 (in/yd) (Home Health) 2 x Per Week/30 Days Discharge Instructions: Apply over Kerlix as directed. Electronic Signature(s) Signed: 06/23/2020 5:33:44 PM By: Baltazar Najjar MD Signed: 06/23/2020 5:52:16 PM By: Zenaida Deed RN, BSN Entered By: Zenaida Deed on 06/23/2020 11:40:39 -------------------------------------------------------------------------------- Problem List Details Patient Name: Date of Service: [name], [name] 06/23/2020 10:45 A M Medical Record Number: [id number] Patient Account Number: [id number] Date of Birth/Sex: Treating RN: Aug 01, 1960 (59 y.o. [name] Primary Care Provider: [name] Other Clinician: Referring Provider: Treating Provider/Extender: [name], [name] in Treatment: 13 Active Problems ICD-10 Encounter Code Description Active Date MDM Diagnosis T81.31XD Disruption of external operation (surgical) wound, not elsewhere classified, 03/23/2020 No Yes subsequent encounter S90.811D Abrasion, right foot, subsequent encounter 03/23/2020 No Yes L97.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 layer exposed 03/23/2020 No Yes L97.312 Non-pressure chronic ulcer of right ankle with fat layer exposed 03/23/2020 No Yes L97.322 Non-pressure chronic ulcer of left ankle with fat layer exposed 03/23/2020 No Yes L89.616 Pressure-induced deep tissue damage of right heel 05/05/2020 No Yes Inactive Problems ICD-10 Code Description Active Date Inactive Date I89.0 Lymphedema, not elsewhere classified 03/23/2020 03/23/2020 L03.115 Cellulitis of right lower limb 04/07/2020 04/07/2020 Resolved Problems Electronic Signature(s) Signed: 06/23/2020 5:33:44 PM By: Baltazar Najjar MD Entered By: Baltazar Najjar on 06/23/2020 12:21:31 -------------------------------------------------------------------------------- Progress Note Details Patient Name: Date of Service: [name] 06/23/2020 10:45 A M Medical Record Number: [id number] Patient Account Number: [id number] Date of Birth/Sex: Treating RN: 07/24/1960 (60 y.o. [name] Primary Care Provider: [name] Other Clinician: Referring Provider: Treating Provider/Extender: [name], [name] in Treatment: 13 Subjective History of Present Illness (HPI) 11/23/15; this is a patient I remember from a previous stay in this clinic in 2012.  The patient says this was for a wound in this same area as currently although I have no information to verify that. Most of the information is provided by his caregiver from the group home where he resides. Apparently he has had a wound in this area for 8 months. He also has erythema around  this area and has had multiple rounds of antibiotics apparently with some improvement but the erythema is persists. He apparently had a fracture in the ankle 2 months ago and was seen by Dr. Lajoyce Corners . He apparently had a splint applied and more recently a heel offloading boot. He had home health coming out to a week ago not clear what they are dressing this with but they apparently discharged him perhaps because of one of Dr. 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 He has a Nurse, adult for transferring at home he is nonambulatory and does not wear footwear. He is not a diabetic. Looking through [facility] he appears to have fairly active primary care. The area on the ankle is variably labeled as a pressure area and/or chronic venous insufficiency. He had a recent venous ultrasound on 5/25 that did not show a DVT in the left leg however this was not a reflux study. There was no superficial DVT either. Arterial studies on 10/15/14 showed a left ABI of 1.16 Center right of 1.12 waveforms were triphasic he does not have significant arterial disease. He has had recent x-rays of the left foot and ankle. The left foot did not show any abnormality. Left ankle x-ray showed a spiral fracture of the left tibial diaphysis without evidence of osteomyelitis. 11/30/15 culture I did last week showed pseudomonas I changed him to oral ciprofloxacin. Erythema is a lot better. 12/07/15 area around the wound shows considerable improvement of the periwound erythema. 12/14/15; the patient has a improvement of the periwound erythema which in retrospect I think with cellulitis successfully treated. We have been using Iodoflex started last week 12/25/15 wound on the left medial malleolus and dorsal left foot. Theraskin #1 applied READMISSION 02/27/16; the patient was admitted to hospital from 8/14 through 01/18/16 initially felt to have cellulitis of his left lower leg however he was noted to  have significant persistent pain and swelling. A duplex ultrasound was ordered that was negative. Obie Dredge an x-ray was done that showed a spiral fracture of the left leg. With posterior displacement and angulation at the mid left tibia. Orthopedics saw the patient and splinted the leg. He was then sent to [facility] skilled facility and only return to his group home last week. He has wounds on the left medial malleolus, left dorsal foot and a worrisome area on the plantar left heel which is not really open but may represent a hematoma or a DTI 03/06/16; areas of left anterior and medial ankle both look improved. There are 3 small wounds here. He has a area over the dorsal left first metatarsal head which is not open. In meantime his left heel has opened 03/14/16 the medial left ankle wounds looks stable to improved. The area on the posterior left heel has opened up and had a very adherent eschar and slept over this 03/21/16 patient has 3 wounds on his left medial ankle is stable area on his left dorsal first metatarsal head appears to be improved. Continued problems with adherent eschar over the left heel 03/28/16; patient has 3 wounds on his left medial ankle and lower leg all of  these appear to be stable. The problem continues to be the left heel and the adherent eschar. This may have been a pressure ulcer from her brace at one point. He has a English as a second language teacher we have been using [employer] and all wound areas. Dressings are being changed by home health 04/04/16; the patient's areas on the medial aspect of the left ankle looks superficial and appear to be gradually closing. The area on the plantar aspects/Achilles aspect of his left heel as surface slough and nonviable subcutaneous tissue requiring debridement. 04/11/16; the areas on the patient's medial left ankle appear to be superficial and appear to be closing. The area on the plantar heel on the left has surface slough and nonviable subcutaneous  tissue requiring debridement. I don't think this is too much different from last week there is also undermining 04/25/16; patient is followed in his group home by [facility] home health. The area on the plantar heel is smaller but with still with some depth. We're using Iodoflex to this. The areas on the left ankle looks healthy but dry and somewhat larger. Cause of this is not really clear using Prisma 05/02/16; areas x3 all look improved expecially the areas on the medial ankle. Heel wound is smaller but still deep 05/09/16; all areas o3 look improved on the medial ankle. Heel wound is also contracting. 12/0.9/17 the areas on his left medial ankle are all closed. I think these are largely chronic venous insufficiency however there is no edema. The area on his left heel was a pressure area. READMISSION 08/02/16; this man is a patient that we have had in this clinic on several times before. I believe he has cerebral palsy is nonambulatory. He has chronic venous insufficiency with venous inflammation. He was last year in the fall of 2017 with areas on his left medial ankle left heel and left dorsal foot. He arrives today with once again a vague history. As mentioned he is nonambulatory he spends most of his day in a pressure offloading boot nevertheless they noted wounds in this area on the medial left ankle 2 weeks ago. There is any other history here I am uncertain 08/15/16; the patient's wounds on his medial ankle actually look better however the area on the left lateral/dorsal foot is covered in black necrotic eschar. Previous formal arterial studies did not show significant arterial disease with normal ABIs and triphasic waveforms. He is immobilized and at risk for pressure although he seems aware protective boots at all times as far as I am aware. 08/22/16; in general his wounds look better. All the wounds on his medial left ankle look improved the area on his lateral foot however continues to  be problematic. Patient comes in the clinic today in a new wheelchair he is quite delighted 09/05/16; a wound on the left medial Calf and left dorsal foot are healed. The area on the left lateral foot is improved however the area on the left medial malleolus is inflamed with a considerable degree of surrounding erythema. This looks like cellulitis. ABIs and clinical exam are within normal limits 09/12/16; the patient continues to have a wound on the left medial calf. I was concerned about cellulitis in this area last week and gave him a course of doxycycline. The wound looks better here. The area on the left lateral foot however is unchanged 09/20/16; the patient came in this week early after traumatizing his toe on the left foot on a table while moving around in his motorized  wheelchair. 09/26/16- he is here for follow-up evaluation of his left foot and malleolus ulcers. Since his appointment he abraded his left medial hallux against a table while in his motorized wheelchair, apparently trying to pull up to the table for a meal. He continues to complain of tenderness to the left lateral foot, surrounding the current ulcer 10/07/16; left foot and malleolus ulcers. 10/14/16; the patient is doing well. He has a wound on the left lateral, left medial malleolus and atraumatic wound on the left great toe. Although these appear to be superficial and closing 10/31/16; the patient is doing well. The area on the left lateral foot, left medial malleolus and the left great toe or all epithelialized. He has home health 11/14/16; patient arrives today with the area on the left great toe healed. The left lateral foot however in the left medial malleolus and the ankle superficial wounds that are slightly larger. He has erythema and tenderness around the area on the left lateral foot. As well he is complaining of dysuria. His caregiver notes that when he gets like this he is more lethargic which indeed seems to be the case  today 11/21/16; left great toe is still healed. His wound on the left lateral foot appears healthy and slightly smaller also the area on the left medial ankle. This had black eschar on it which I removed. The remaining wound looks satisfactory 12/12/16; patient hasn't been here in a number of weeks. The area on the left lateral foot is healed however on his medial ankle has 2 open areas one of them covered with a necrotic eschar. He also has a new wound on the right second toe which was apparently trauma induced while he was at a movie 12/27/16; the patient has wounds on his left medial mallelus and left lateral foot that are just about closed. The area on the right second toe which was trauma from last time is still open but again everything looks a lot better here. 01/30/17; patient arrives in clinic today and is really been a long time since I've seen him. The wounds are all epithelialized on the left foot this includes his left lateral foot, left medial malleolus. Apparently was in the ER for what of I think was a fungal infection on his buttock's he was prescribed Diflucan and doxycycline I think this is already getting better. READMISSION T is a now 60 year old man who has advanced cerebral palsy and functional quadriplegia. We have had him in this clinic multiple times before for wounds erry on his right ankle, left ankle, left dorsal foot and most recently from 08/02/16 through 01/30/17 with his left medial ankle and left foot and finally right second toe. His attendant from the group home where he lives is very knowledgeable about him. She states that they noticed breakdown in his skin in his feet sometime in January although they could not get an appointment until today in this clinic. The previous wounds have always been felt to be secondary to incidental trauma/pressure areas or possibly some degree of chronic venous insufficiency. He had normal arterial studies in may of 2016. He has definite  open wounds on the right lateral foot over the base of the fifth metatarsal, the right second toe DIP, the left medial malleolus. He has concerning areas over the left lateral fifth metatarsal base, retaform purpura over the medial left ankle and perhaps the medial left first toe. 07/21/17; culture we did last week from the base of the right fifth  metatarsal grew group G strep. This should've been sensitive to the doxycycline I gave him. The other major wounds are on the right second toe DIP, left medial malleolus, base of the left lateral fifth metatarsal and the medial left ankle and medial left toe. 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 We have macrovascular arterial studies scheduled for Thursday although I don't think this is what the problem is. 07/28/17; patient arrives with really no improvement. He has a wound on the right second toe, lateral right foot, right medial malleolus which was new this week, left medial malleolus, but looked like a DTI on the left great toe. His macrovascular studies are on 07/30/17 although I would be surprised if this provides all the explanation. He has small areas even on his right second and third toe which look like splinter-type hemorrhages. The question I have is where is this coming from. I'd like to see the vascular studies however before we pursue this. He has had a DVT in the past but is no longer on anticoagulants. I'll need to review his history 08/04/17; the patient had his ARTERIAL STUDIES this showed a right ABI of 1.16 and the left ABI of 1.20. TBI on the left at 0.84. He is at a right first toe amputation. The studies were unchanged from studies in May 2016. It was not felt that he had significant lower extremity arterial disease In the meantime we are using silver alginate to the wounds on the right second toe right lateral foot left medial malleolus. I'm going to change to Silver collagen today. 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 Working him up for this would include an echocardiogram probably a CT scan of the chest and abdomen to look at the major thoracic and abdominal aortic vessels. I am not going to involve myself in this unless there is more symptoms/signs 08/18/17; the patient arrives today with wounds looking somewhat better. The remaining areas are the right second toe dorsal, right lateral foot and left medial malleolus which only has a small open area. We've been using silver collagen 09/01/17;right second toe dorsally probes to bone today. This is a deterioration. We've been using silver collagen. Only a small open area remains on the left medial malleolus. 09/09/17; right second toe appears better. There is no probing bone. He has a small area remaining on the left medial malleolus, the right lateral foot. We have been using silver collagen 10/07/17; the patient returns to clinic today after a month hiatus. He has wounds over the right second toe, right lateral foot and the left medial malleolus. We have been using silver collagen. He has home health. 10/24/17; patient has open wounds over the right second toe, now the left fourth toe and a superficial area over his left buttock. Some of the other areas have healed including the left medial malleolus and right lateral foot. Using silver collagen on these wounds 11/07/17; the patient has closed his wounds on the right second toe and left fourth toe although they look all normal. Last time he was here he had a superficial area over the left buttock this is also closed. The right lateral foot wound I had closed out last time although it is seems to have reopened today. 628/19 the areas over the right second and left fourth toe remains closed. The right lateral foot has tightly adherent necrotic surface over.  We have been using silver  collagen he offloads this and a bunny boot 12/12/17; the patient has reopened the recurrent area over the right second PIP joint. 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 He does not have obvious macrovascular disease. The area over the right lateral foot looks about the same as last time tightly adherent necrotic debris. We've been using Hydrofera Blue 12/25/17; 2 week follow-up. The area over the right second PIP is closed however the area on the right lateral foot is worse with surrounding erythema and pain compatible with cellulitis is going to need an antibiotic. ooAS WELL..... We have looked at his buttocks and lower back. He has 3 stage II ulcers. The exact history here is unclear however he is very disabled man with cerebral palsy. He goes to a day program from the group home in which she lives 5 days a week and I think he is on his buttock all day on these visits. Surrounding the wounds see has extensive stage I injury as well. ooFINALLY it looks as though he has lost weight. I'm not the only staff member that is recognized this 01/08/18 on evaluation today patient appears to be doing better in regard to his gluteal wounds. In fact everything appears to be healed back here although very dry. He has a couple open sores on the scrotal region other than this it is really his foot that is still giving him the most trouble. He did complete the Keflex unfortunately it seems like there's still a lot of erythema in this in fact looks worse than it did previous. I'm gonna culture this today and likely start with a different antibiotic. 01/16/18; I was reassured that the wounds on his buttocks and sacrum are healed. I did not look at these the day. The culture that was done last week on 01/11/18 of the right lateral foot wounds showed Pseudomonas. Bactrim is not a good choice for this. Ciprofloxacin interacts with his muscle relaxants therefore I  prescribed cefdinir 300 twice a day for 7 days. Bactrim coverage of pseudomonas is not reliable although the degree of erythema looks a lot better 01/30/18; the patient has completed his antibiotics. The area does not have infection on the right lateral foot. The wounds look a lot better. We've been using silver alginate 02/12/18; there is on the right lateral foot. Copious amounts of surface eschar. Also similarly over the PIP of the right second toe. We've been using silver alginate to date 03/02/2018; the area that remains is on the right lateral foot. The right second toe is healed. 03/16/2018; the area there is original wound on the right lateral foot. Still requiring debridement. We are using Hydrofera Blue. The right second toe is still healed/closed 03/30/2018; right lateral foot there is 2 small open areas remaining. They actually look quite healthy we have been using Hydrofera Blue the right second toe remains closed 04/13/2018; right lateral foot. 1 of the 2 small open areas from last time has closed over. The other had surface slough that I removed with a #3 curette. This looks quite healthy. The right second toe remains closed. We are using Hydrofera Blue 05/01/2018; right lateral foot very superficial areas that look like they are trying to close over. He had a new threatened area on the left medial ankle just below the medial malleolus. I think these represent chronic venous insufficiency areas 05/14/2018; the right right lateral foot looks  like it closed over as was predicted last time however he has some dark areas subcutaneously erythema superiorly and this is really very tender. This is either coexistent cellulitis or perhaps a deep tissue injury. The area on the left medial ankle is superficial but has some size. We have been using Hydrofera Blue to both areas. The patient complains of pain in his right foot 06/09/2018; right lateral foot was closed over last time but there was  coexistent cellulitis around this. I gave him Levaquin. He also has an area on the left medial malleolus. He has not been back to clinic in over 3-1/2 weeks. We are using Hydrofera Blue I think to the left medial malleolus and silver alginate to the right lateral foot 1/21; right lateral foot still non-viable debris over this area and the left medial malleolus. Both of these vigorously washed off with antiseptic gauze. We have been using silver alginate. 2/4; right lateral foot this looks somewhat better. Unfortunately the area over the left medial malleolus has deteriorated. Using silver alginate in both area 2/18; both wounds arrived today covered in a lot of necrotic debris. We have been using silver alginate. Clearly not making a lot of progress. 3/3; the patient's right second toe is reopened over the PIP. I am not sure why this is happening. He may have microvascular disease. We have checked his macrovascular flow in the past and found it to be normal. He may need an x-ray. The area on the right lateral foot requires debridement. The area on the left lateral ankle is worse. We have been using PolyMem Ag 08/26/18 on evaluation today patient appears to be doing a little worse in regard to his lower extremities based on what I'm seeing what the nursing staff is telling me. His left medial ankle appears potential to be infected there is your thing on want surrounding the wound. His right second toe is also of concern at this point in my pinion. We have not done x-rays of these areas that may be a good idea to go ahead and proceed with at this point to ensure will not deal with anything such as osteomyelitis. 4/16; it is been a while since I saw this patient. He now has wounds on the right lateral foot, dorsal right second toe, substantially on the left medial malleolus and a small area on the left lateral foot. We have been using PolyMem Silver. Curlex and Coban. He has home health changing the  dressing. Since last time he was here he had a culture of the left medial ankle that showed MRSA. He is completed a course/7 days of doxycycline. XRAYS of the left ankle and right foot that were ordered were negative for osteomyelitis 4/30; he arrives in clinic today with some maceration around the wound and a lot of tenderness and erythema in a circumferential area around the right lateral foot. He has wound areas on the right lateral foot dorsal right second toe, largely over the left medial malleolus and a small area over the left lateral foot 5/7; completed his antibiotics from last time right lateral foot cellulitis is better and the area on his left knee which we discovered after he had been seen by the provider also is improved. He still has extensive wound areas over the right lateral foot, left medial malleolus, right second toe and left lateral foot although all of these look somewhat better with PolyMem 5/15. Wounds on the right lateral foot, right second toe left medial malleolus  and the left lateral foot. All of these look somewhat better. We have been using PolyMem Ag 5/26; the wound on the right lateral foot is almost closed over but he still complains of a lot of pain in this area. He has a eschared area on the right second toe but no open wound.Geoffry Paradise concerning today his original wound on the left medial malleolus looks healthy however just inferior to this almost areas of deep tissue injury and there is an area on the left lateral foot also looks like a deep tissue injury. In the past I have wondered whether he might have cholesterol emboli or some other form of a vasculopathy. I wonder whether this could have something to do with this. 6/2; right lateral foot wound is still open I gave him empiric antibiotics last week without really a lot of change she is still complaining of pain in this area. We x- rayed his feet at some point in April these were negative. It would be  difficult to do an MRI. The area on his left medial malleolus looks better. Right second toe is healed. He still has what looks to be a small pressure related area on the left lateral foot but it is not open. We are using PolyMem to all wound which seems to be doing nicely 6/16; right lateral foot wound is still open. The wound looks better although there is still a lot of tenderness around it. Rather than more empiric antibiotics I have cultured this today. We are using PolyMem Ag. On the left lateral malleolus the wound appears better. He has a new area on the left lower anterior pretibial area 6/29; 2-week follow-up. Culture I did of the right lateral foot 2 weeks ago showed MRSA. I gave him 2 weeks of Septra which he should be just about finishing now and this seems better. We got a call from home health last week to report swelling in the left leg because we were listed as his primary. We referred him back to primary care. He arrives today with intense erythema and tenderness around the 2 wounds on the left medial ankle on the left lower calf. The area on the right lateral foot looks better 7/7; I brought him back this week to follow-up on his left foot which had cellulitis last week. I gave him clindamycin which as far as I know he tolerated. The erythema is better. He has a 3 current wounds one on the right lateral foot a small area on the right lateral malleolus and the more recent area on the left anterior pretibial area. 7/21; bilateral distal lower leg and foot wounds. The area on his right lateral foot looks a lot better. He has an area on his left pretibial area distally. The area on the medial malleolus on the left which is 1 of his worst wounds has closed over. He does not appear to have any open areas in his remaining toes 8/4-Patient returns at 2 weeks, he has the remaining wound on the left lower leg the other 2 wounds have healed. Patient is continued to be treated with  silver alginate with a 2 layer wrap on the left 8/18; I been following this patient for a long period for bilateral lower extremity wounds. 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 His wounds are probably pressure although he religiously wears thick bunny boots. He does not have macrovascular disease but I did formal testing on him  perhaps 2 or 3 years ago. I have often wondered about either cholesterol emboli or a vasculopathy of some sort given the distal nature of these variable locations etc. He has 2 areas that we have been following one on the left medial lower leg and one on the right lateral foot. Both of these are mostly healed today there is some eschar over sections of both areas although I did not disturb this. 03/31/2019 on evaluation today patient appears to be doing a little bit more poorly in regard to the sacral region. He has 2 small wounds noted at this point. Fortunately there is no signs of active infection at this time. No fevers, chills, nausea, vomiting, or diarrhea. He is having some pain when he sits on his gluteal region a big part of the reason he has these wounds is likely due to the fact that his wheelchair has not been functioning properly. He does still have a couple areas of deep tissue injury on his lower extremities he does need new Prevalon offloading boots as well. 04/28/2019 on evaluation today patient appears to be doing well with regard to his wounds in particular. His ankle area has fairly small based on what I am seeing currently and his sacral region actually is still open but does not appear to be doing too poorly in my opinion. Fortunately there is no signs of active infection at this time. He was in the hospital unfortunately and this was from 04/08/2019 through 04/12/2019 secondary to persistent diarrhea, dehydration and acute kidney injury. With that being said fortunately the patient does not seem to be  doing too poorly at this time which is good news. He is having pain mainly in the sacral region. 06/23/2019 on evaluation today patient appears to be doing well with regard to his sacral region which in fact is completely healed. Unfortunately he has an area of rubbing/pressure on his right lateral chest/back region. This is where it is rubbing up on his chair. Subsequently he also has an open wound on the left medial malleolus which is not very open but starting to crack and then on the right lateral foot this is much more open at this point. Unfortunately there is no signs of significant improvement in regard to these areas and in fact there does appear to be evidence of active infection quite significantly. No fevers, chills, nausea, vomiting, or diarrhea. The patient did test positive for COVID-19 that has been greater than 14 days ago although they did not know the exact timing. That is why he was not here for his last appointment. 07/07/2019 upon evaluation today patient actually appears to be doing quite well in regard to his left ankle as well as the chest/back region which is doing great in fact appears to be healed in my opinion. He still has an issue on his lateral right foot that is open and appears to be infected but does have me more concerned. In fact I do believe the doxycycline helped in general for the cellulitis but I believe that may have been more MRSA related based on the culture that we did. What I am seeing right now has more of the blue-green drainage and may be more consistent with a Pseudomonas that I was hoping was not really causing much of an issue but I think it may be at this point on the right lateral foot. For that reason I do think treatment would be a good idea of the reason I avoided the  Cipro before was the potential for QT prolongation which could obviously cause problems with the patient with results of interaction with respiratory wound. The tizanidine also had  some interactions as well. Nonetheless I think at this point that it would be a possibility for [place] to use topical gentamicin to see if this could be of benefit for the patient underneath the silver alginate dressing. 07/21/2019 upon evaluation today patient appears to be doing fairly well at this point in regard to his lower extremity on the right. He does have a wound on his right lateral foot as well as the right fifth toe and the right fourth toe. Unfortunately he continues to experience significant issues with pressure and trauma to some degree and I think this is just due to the fact that again he is not really not able to control his legs and what is happening here. Fortunately there is no signs of active infection at this time. 08/04/2019 upon evaluation today patient actually appears to be doing quite well with regard to his toe ulcers I am very pleased in that regard. Unfortunately the foot ulcer is showing some signs of erythema his primary care provider has placed him on clindamycin at this point. Again I do believe this can be beneficial for him. With that being said I do think the patient may benefit from a light Kerlix and Coban wrap to help with some of his edema at this point. 08/18/2019 upon evaluation today patient appears to be doing still poorly in regard to his lateral foot ulcer. This is not very deep but there is some concern about the possibility of osteomyelitis. I think it would be appropriate for [place] to go ahead and have an x-ray at this time. The patient is currently on clindamycin which from his culture which was performed on 06/23/2019 it does appear that he would benefit from that in regard to the MRSA. However Pseudomonas was also identified then and the patient actually has some blue-green drainage at this point as well. I am much more concerned about the possibility of the Pseudomonas being an infection is causative agent at this point that I am the MRSA. Subsequently Cipro  the patient was sensitive to but we never use that is stable use a topical gent due to the fact that unfortunately there was some interaction between the Cipro and the patient's risk for down potentially causing QT prolongation. 08/31/2019; have not seen this patient in quite a period of time. He has a substantial wound on his right lateral foot we have been using gentamicin ointment with Hydrofera Blue over the top. From what I am able to determine he is also on antibiotics perhaps clindamycin although I have not had a chance to look over this. He also has an eschared area on the right fifth toe as well as the right second toe. 4/20; right lateral foot wound. He comes in today with intense erythema and tenderness around this wound. The wound itself has a surface on it but it doesn't look particularly healthy a lot of gelatinous debris. There is no palpable bone. We've been using gentamicin ointment and Hydrofera Blue. Since the patient was last here he was seen by Dr. Algis Liming of infectious disease. He ordered an MRI that I think is due later this month on 4/30. He apparently also checked a sedimentation rate and C-reactive protein metabolic panel CBC with differential. At the time he was seen he wanted to withhold any microbials pending identification of the  depth of the infection although looking at this I don't know that I'm going to be able to hold off for another 10 days. 4/29; he went on to have the MRI that was ordered by Dr. Algis Liming of infectious disease. 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 I had given him cefdinir last week for surrounding cellulitis he is finishing this today. I have communicated with Dr. Algis Liming who wants bone for pathology and culture before proceeding with consideration of IV antibiotics. Fortunately he is due to see Dr. Samuella Cota of podiatry today I am hopeful that the bone biopsy and culture will be considered there 5/4 as I  understand things Dr. Samuella Cota actually wants to remove the diseased proximal fifth metatarsal. Looking back at this decision is probably not a bad one. He has good blood flow he is nonambulatory they are apparently going to do this next week. Paradoxically the wound on the lateral part of his foot is actually a lot better. The cellulitis that was so problematic 2 weeks ago also has resolved. READMISSION 03/23/2020 T is now a 60 year old man he has cerebral palsy and is functionally paraplegic. The last time I saw him he had a diseased proximal fifth metatarsal. 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 Samuella Cota. Culture of this showed Pseudomonas and he was followed by infectious disease Dr. Algis Liming treated with ciprofloxacin. 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 He had Integra placed. A CandS showed Pseudomonas again. Since then he has had Silvadene as a primary dressing, then Santyl and more recently Xeroform and then back to Santyl 3 times a week per his attendant who comes with him. Reviewing his records I was really not prepared for the number of wounds that he currently has. This includes substantial areas on the right lateral foot, right lateral ankle, right fourth toe dorsally, right ankle anteriorly as well as the left lateral ankle and foot. His medical history is unchanged ABIs 1.2 on the right and 1.21 on the left he has not been known to have arterial insufficiency and is not a diabetic 11/5; patient I readmitted to the clinic last week. He has multiple wounds on the right lateral foot, right fourth toe and a new area on the right second toe. He has areas on the right dorsal ankle and a substantive wound over the left lateral ankle and foot. We are using silver collagen. We put him under compression last week and this seems to have helped. He has home  health coming out one other time per week to change the dressing 11/12; no real improvement. Multiple bilateral foot and ankle wounds. ooOn the left he has a substantial wound on the left lateral ankle ooOn the right he has wounds on the dorsal second and fourth toes. ooRight lateral foot we have merged to wounds these are necrotic ooShe has an area on the right dorsal ankle and a area on the right medial malleolus with marked surrounding erythema likely cellulitis. The patient complains about pain that kept him awake all last night from wraps that were too tight. He is not known to have arterial insufficiency. 11/18; no real change. I gave him empiric Levaquin last week directed at previously cultured Pseudomonas this is not really helped. On the right he has wounds on the right lateral foot right  lateral ankle right medial ankle and the dorsal second and fourth toes. Most of these do not have a nice have a healthy surface. There is erythema in his foot which may all be venous stasis. The area on the left lateral malleolus/ankle area looks better 12/3; really disappointing if anything deterioration especially on the right lateral foot. These are necrotic. He also has areas on the right dorsal ankle right medial malleolus and the right second and fourth toes. On the left he has a large area over the left medial ankle. There is some erythema around the wound. Things are very tender I have reviewed his most recent arterial Dopplers which are actually in 2019 but he has never been felt to have arterial issues. At that time his ABI on the right was 1.16 [no TBI due to amputation] on the left his ABI was 1.20 with a great toe pressure of 0.24. Waveforms on the right and left toes were all normal he was not felt to have significant evidence of lower extremity arterial disease. ABIs in our clinic have always been normal his pulses have been palpable. He had an MRI of his right foot in April 2021 this  showed a soft tissue ulceration along the lateral midfoot with underlying osteomyelitis no abscess. 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 He returned to our clinic on 03/23/2020. The same pattern of wounds that I am describing currently this was a lot worse than earlier in the year. I gave him empiric antibiotics because he had had Pseudomonas in the past this does not seem to have helped. We have not made any progress on any of these wound site. The patient is in a lot of pain 12/10; the patient comes in with a new deep tissue injury on the right heel large necrotic wound on the right lateral foot, smaller necrotic wound on the dorsal ankle and medial ankle. Necrotic wounds on the second and fourth toes. We have been using Hydrofera Blue On the left he has the area on the left little medial ankle which actually looks some better healthier looking tissue. We are using Hydrofera Blue here as well X-ray ordered last week showed no osseous erosion no plain film indication of osteomyelitis. 12/17; PCR culture I did of the right lateral foot wound last week showed a large titer of Enterococcus faecalis and low titers of of coag negative staph and Peptostreptococcus. I have started him on Augmentin for 2 weeks but I may continue beyond this. He has continuous complaints of pain now apparently making it difficult for him to rest at night. Substantial wound area in the right foot and ankle. Unfortunately the left medial foot seems to be more substantial this week. 12/27; in general the wounds on the right foot and ankle and the left medial ankle look better. There is certainly less erythema in the right foot. I have him on Augmentin and I am going to extend this for another week. MRI is booked for 06/01/2020 06/02/2020; patient's extensive skin breakdown on the right foot continues it is stable to perhaps slightly improved however there is a considerable amount of tissue breakdown. Unfortunately his  MRI shows findings consistent with osteomyelitis in the lateral aspect of the cuboid. The overlying wound in this area is deep but less necrotic than I am used to seeing. He has area on the right posterior heel is now necrotic.. The area on the left lateral ankle/foot looks somewhat better. The patient states he  continues to be in a lot of pain from the right foot and he is really tired of hurting from these wounds. I have discussed things with T before. He is anxious to be out of pain. He is nonambulatory. Furthermore the osteomyelitis in the right foot and the erry extensive wounds in this area are only a source of potential systemic infection. He seems in favor of proceeding with a below-knee amputation. I cannot argue with this we have not made a lot of progress in several months working on these wounds ever since his fifth ray amputation by Dr. Samuella Cota 1/28; in general the patient's right foot looks better although there is several wounds still present there were certainly look less angry. The area on the right lateral foot looks better he still has a necrotic area on the right Achilles heel but this is separating the right fourth toe and the right medial malleolus. We have been using silver alginate with Bactroban on all of these wounds. On the left medial ankle the wound looks healthy we have been using Hydrofera Blue. He has an appointment I think on Monday with Dr. Victorino Dike with regards to a BKA. Although the wounds on the right foot look better than they did heat the patient still relates that he has occasional pain and he suffered with this through the years he would rather have a below-knee amputation. Provided Dr. Victorino Dike agrees with this he is anxious to proceed Objective Constitutional Sitting or standing Blood Pressure is within target range for patient.. Pulse regular and within target range for patient.Marland Kitchen Respirations regular, non-labored and within target range.. Temperature is normal  and within the target range for the patient.Marland Kitchen Appears in no distress. Vitals Time Taken: 10:47 AM, Temperature: 97.7 F, Pulse: 77 bpm, Respiratory Rate: 16 breaths/min, Blood Pressure: 139/82 mmHg. General Notes: Wound exam ooOn the left side debride the wound on the medial ankle looks better healthy surface. ooOn the right the right medial malleolus wound again looks clean and healthier on the right posterior and necrotic wound that I cleaned up with a scalpel #15 good pickups hemostasis with direct pressure. Right lateral foot wound looks better and the area on the fourth toe also looks better the area of the second toe is Integumentary (Hair, Skin) Wound #41 status is Open. Original cause of wound was Trauma. The wound is located on the Right T Fourth. The wound measures 0.2cm length x 0.2cm oe width x 0.2cm depth; 0.031cm^2 area and 0.006cm^3 volume. There is Fat Layer (Subcutaneous Tissue) exposed. There is no tunneling or undermining noted. There is a small amount of serosanguineous drainage noted. The wound margin is flat and intact. There is large (67-100%) red granulation within the wound bed. There is no necrotic tissue within the wound bed. Wound #42 status is Healed - Epithelialized. Original cause of wound was Gradually Appeared. The wound is located on the Right,Medial Malleolus. The wound measures 0cm length x 0cm width x 0cm depth; 0cm^2 area and 0cm^3 volume. There is no tunneling or undermining noted. There is a none present amount of drainage noted. The wound margin is flat and intact. There is no granulation within the wound bed. There is no necrotic tissue within the wound bed. Wound #43 status is Open. Original cause of wound was Pressure Injury. The wound is located on the Right,Distal,Anterior Lower Leg. The wound measures 0.5cm length x 0.5cm width x 0.1cm depth; 0.196cm^2 area and 0.02cm^3 volume. There is Fat Layer (Subcutaneous Tissue) exposed. There  is no tunneling  or undermining noted. There is a medium amount of serosanguineous drainage noted. The wound margin is flat and intact. There is large (67-100%) red granulation within the wound bed. There is no necrotic tissue within the wound bed. Wound #44 status is Open. Original cause of wound was Pressure Injury. The wound is located on the Right,Proximal,Lateral Foot. The wound measures 4.5cm length x 3.2cm width x 0.2cm depth; 11.31cm^2 area and 2.262cm^3 volume. There is tendon and Fat Layer (Subcutaneous Tissue) exposed. There is no tunneling or undermining noted. There is a medium amount of serosanguineous drainage noted. The wound margin is flat and intact. There is medium (34-66%) red, pink granulation within the wound bed. There is a medium (34-66%) amount of necrotic tissue within the wound bed including Adherent Slough. Wound #46 status is Healed - Epithelialized. Original cause of wound was Gradually Appeared. The wound is located on the Right T Second. The wound oe measures 0cm length x 0cm width x 0cm depth; 0cm^2 area and 0cm^3 volume. There is no tunneling or undermining noted. There is a none present amount of drainage noted. The wound margin is flat and intact. There is no granulation within the wound bed. There is no necrotic tissue within the wound bed. Wound #47 status is Open. Original cause of wound was Gradually Appeared. The wound is located on the Left,Medial Malleolus. The wound measures 2cm length x 2.8cm width x 0.2cm depth; 4.398cm^2 area and 0.88cm^3 volume. There is Fat Layer (Subcutaneous Tissue) exposed. There is no tunneling or undermining noted. There is a medium amount of serosanguineous drainage noted. The wound margin is distinct with the outline attached to the wound base. There is medium (34-66%) pink granulation within the wound bed. There is a medium (34-66%) amount of necrotic tissue within the wound bed including Adherent Slough. Wound #48 status is Open. Original  cause of wound was Pressure Injury. The wound is located on the Right Calcaneus. The wound measures 3.2cm length x 5cm width x 0.1cm depth; 12.566cm^2 area and 1.257cm^3 volume. There is Fat Layer (Subcutaneous Tissue) exposed. There is no tunneling or undermining noted. There is a small amount of serosanguineous drainage noted. The wound margin is flat and intact. There is small (1-33%) pink granulation within the wound bed. There is a large (67-100%) amount of necrotic tissue within the wound bed including Eschar. 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 There was a Excisional Skin/Subcutaneous Tissue Debridement with a total area of 16 sq cm performed by Maxwell Caulobson, Michael G., MD. With the following instrument(s): Blade to remove Viable and Non-Viable tissue/material. Material removed includes Eschar, Subcutaneous Tissue, and Slough after achieving pain control using Other (benzocaine 20% spray). No specimens were taken. A time out was conducted at 11:35, prior to the start of the procedure. A Minimum amount of bleeding was controlled with Pressure. The procedure was tolerated well with a pain level of 4 throughout and a pain level of 3 following the procedure. Post Debridement Measurements: 3.2cm length x 5cm width x 0.1cm depth; 1.257cm^3 volume. Post debridement Stage noted as  Category/Stage III. Character of Wound/Ulcer Post Debridement requires further debridement. Post procedure Diagnosis Wound  #48: Same as Pre-Procedure Plan Follow-up Appointments: Return Appointment in 2 weeks. Bathing/ Shower/ Hygiene: May shower with protection but do not get wound dressing(s) wet. Other Bathing/Shower/Hygiene Orders/Instructions: - scrub legs gently with wash cloth and soap and water with dressing changes Edema Control - Lymphedema / SCD / Other: Elevate legs to the level of the heart or above for 30 minutes daily and/or when sitting, a frequency of: Off-Loading: Heel suspension boot to: - heel protectors at all times Other: - avoid any pressure to back of heels Home Health: No change in wound care orders this week; continue Home Health for wound care. May utilize formulary equivalent dressing for wound treatment orders unless otherwise specified. Other Home Health Orders/Instructions: - [facility] WOUND #41: - T Fourth Wound Laterality: Right oe Cleanser: Soap and Water (Home Health) 2 x Per Week/30 Days Discharge Instructions: May shower and wash wound with dial antibacterial soap and water prior to dressing change. Cleanser: Wound Cleanser (Home Health) 2 x Per Week/30 Days Discharge Instructions: Cleanse the wound with wound cleanser prior to applying a clean dressing using gauze sponges, not tissue or cotton balls. Peri-Wound Care: Sween Lotion (Moisturizing lotion) 2 x Per Week/30 Days Discharge Instructions: Apply moisturizing lotion with dressing changes Topical: Bactroban (Home Health) 2 x Per Week/30 Days Discharge Instructions: apply to wound bed under alginate Prim Dressing: KerraCel Ag Gelling Fiber Dressing, 4x5 in (silver alginate) (Home Health) 2 x Per Week/30 Days ary Discharge Instructions: Apply silver alginate to wound bed as instructed Secondary Dressing: Woven Gauze Sponge, Non-Sterile 4x4 in (Home Health) 2 x Per Week/30 Days Discharge Instructions: Apply over primary dressing as directed. Secondary Dressing: ABD Pad, 5x9 (Home Health) 2 x Per Week/30 Days Discharge  Instructions: Apply over primary dressing as directed. Com pression Wrap: Kerlix Roll 4.5x3.1 (in/yd) (Home Health) 2 x Per Week/30 Days Discharge Instructions: Apply Kerlix and Coban compression as directed. Com pression Wrap: Coban Self-Adherent Wrap 4x5 (in/yd) (Home Health) 2 x Per Week/30 Days Discharge Instructions: Apply over Kerlix as directed. WOUND #43: - Lower Leg Wound Laterality: Right, Anterior, Distal Cleanser: Soap and Water (Home Health) 2 x Per Week/30 Days Discharge Instructions: May shower and wash wound with dial antibacterial soap and water prior to dressing change. Cleanser: Wound Cleanser (Home Health) 2 x Per Week/30 Days Discharge Instructions: Cleanse the wound with wound cleanser prior to applying a clean dressing using gauze sponges, not tissue or cotton balls. Peri-Wound Care: Sween Lotion (Moisturizing lotion) 2 x Per Week/30 Days Discharge Instructions: Apply moisturizing lotion with dressing changes Topical: Bactroban (Home Health) 2 x Per Week/30 Days Discharge Instructions: apply to wound bed under alginate Prim Dressing: KerraCel Ag Gelling Fiber Dressing, 4x5 in (silver alginate) (Home Health) 2 x Per Week/30 Days ary Discharge Instructions: Apply silver alginate to wound bed as instructed Secondary Dressing: Woven Gauze Sponge, Non-Sterile 4x4 in (Home Health) 2 x Per Week/30 Days Discharge Instructions: Apply over primary dressing as directed. Secondary Dressing: ABD Pad, 5x9 (Home Health) 2 x Per Week/30 Days Discharge Instructions: Apply over primary dressing as directed. Com pression Wrap: Kerlix Roll 4.5x3.1 (in/yd) (Home Health) 2 x Per Week/30 Days Discharge Instructions: Apply Kerlix and Coban compression as directed. Com pression Wrap: Coban Self-Adherent Wrap 4x5 (in/yd) (Home Health) 2 x Per Week/30 Days Discharge Instructions: Apply over Kerlix as directed. WOUND #44: - Foot Wound Laterality: Right, Lateral, Proximal Cleanser: Soap and Water  (  Home Health) 2 x Per Week/30 Days Discharge Instructions: May shower and wash wound with dial antibacterial soap and water prior to dressing change. Cleanser: Wound Cleanser (Home Health) 2 x Per Week/30 Days Discharge Instructions: Cleanse the wound with wound cleanser prior to applying a clean dressing using gauze sponges, not tissue or cotton balls. Peri-Wound Care: Sween Lotion (Moisturizing lotion) 2 x Per Week/30 Days Discharge Instructions: Apply moisturizing lotion with dressing changes Topical: Bactroban (Home Health) 2 x Per Week/30 Days Discharge Instructions: apply to wound bed under alginate Prim Dressing: KerraCel Ag Gelling Fiber Dressing, 4x5 in (silver alginate) (Home Health) 2 x Per Week/30 Days ary Discharge Instructions: Apply silver alginate to wound bed as instructed Secondary Dressing: Woven Gauze Sponge, Non-Sterile 4x4 in (Home Health) 2 x Per Week/30 Days Discharge Instructions: Apply over primary dressing as directed. Secondary Dressing: ABD Pad, 5x9 (Home Health) 2 x Per Week/30 Days Discharge Instructions: Apply over primary dressing as directed. Com pression Wrap: Kerlix Roll 4.5x3.1 (in/yd) (Home Health) 2 x Per Week/30 Days Discharge Instructions: Apply Kerlix and Coban compression as directed. Com pression Wrap: Coban Self-Adherent Wrap 4x5 (in/yd) (Home Health) 2 x Per Week/30 Days Discharge Instructions: Apply over Kerlix as directed. WOUND #47: - Malleolus Wound Laterality: Left, Medial Cleanser: Soap and Water (Home Health) 2 x Per Week/30 Days Discharge Instructions: May shower and wash wound with dial antibacterial soap and water prior to dressing change. Cleanser: Wound Cleanser (Home Health) 2 x Per Week/30 Days Discharge Instructions: Cleanse the wound with wound cleanser prior to applying a clean dressing using gauze sponges, not tissue or cotton balls. Peri-Wound Care: Sween Lotion (Moisturizing lotion) (Home Health) 2 x Per Week/30 Days Discharge  Instructions: Apply moisturizing lotion with dressing changes Prim Dressing: Hydrofera Blue Classic Foam, 4x4 in (Home Health) 2 x Per Week/30 Days ary Discharge Instructions: Apply to wound bed as instructed Secondary Dressing: ABD Pad, 5x9 (Home Health) 2 x Per Week/30 Days Discharge Instructions: Apply over primary dressing as directed. Com pression Wrap: Kerlix Roll 4.5x3.1 (in/yd) (Home Health) 2 x Per Week/30 Days Discharge Instructions: Apply Kerlix and Coban compression , be sure to wrap from base of toes to tibial tuberosity. no too tight Com pression Wrap: Coban Self-Adherent Wrap 4x5 (in/yd) (Home Health) 2 x Per Week/30 Days Discharge Instructions: Apply over Kerlix as directed. WOUND #48: - Calcaneus Wound Laterality: Right Cleanser: Soap and Water (Home Health) 2 x Per Week/30 Days Discharge Instructions: May shower and wash wound with dial antibacterial soap and water prior to dressing change. Cleanser: Wound Cleanser (Home Health) 2 x Per Week/30 Days Discharge Instructions: Cleanse the wound with wound cleanser prior to applying a clean dressing using gauze sponges, not tissue or cotton balls. Peri-Wound Care: Sween Lotion (Moisturizing lotion) 2 x Per Week/30 Days Discharge Instructions: Apply moisturizing lotion with dressing changes Topical: Bactroban (Home Health) 2 x Per Week/30 Days Discharge Instructions: apply to wound bed under alginate Prim Dressing: KerraCel Ag Gelling Fiber Dressing, 4x5 in (silver alginate) (Home Health) 2 x Per Week/30 Days ary Discharge Instructions: Apply silver alginate to wound bed as instructed Secondary Dressing: Woven Gauze Sponge, Non-Sterile 4x4 in (Home Health) 2 x Per Week/30 Days Discharge Instructions: Apply over primary dressing as directed. Secondary Dressing: ABD Pad, 5x9 (Home Health) 2 x Per Week/30 Days Discharge Instructions: Apply over primary dressing as directed. Secondary Dressing: ALLEVYN Heel 4 1/2in x 5 1/2in /  10.5cm x 13.5cm (Home Health) 2 x Per Week/30 Days Discharge Instructions: Apply  over primary dressing as directed. Com pression Wrap: Kerlix Roll 4.5x3.1 (in/yd) (Home Health) 2 x Per Week/30 Days Discharge Instructions: Apply Kerlix and Coban compression as directed. Com pression Wrap: Coban Self-Adherent Wrap 4x5 (in/yd) (Home Health) 2 x Per Week/30 Days Discharge Instructions: Apply over Kerlix as directed. 1. The patient's right foot wounds actually look quite a bit better. A prolonged course of antibiotics topical antibiotics seem to cleaned up the wound surface we have been using silver alginate with Bactroban on the wounds. 2. The patient is able to relate that he is sick of the discomfort and difficulties has had with his right foot and would prefer a below-knee amputation if Dr. Victorino Dike feels that that is a reasonable approach. He is nonambulatory and I cannot vouch for the fact that he has had a long history of difficult wounds in this area 3. 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 I suspect there may be underlying osteomyelitis in this foot as well. 4. The area on the left medial ankle actually looks better Electronic Signature(s) Signed: 06/23/2020 5:33:44 PM By: Baltazar Najjar MD Entered By: Baltazar Najjar on 06/23/2020 12:40:44 -------------------------------------------------------------------------------- SuperBill Details Patient Name: Date of Service: [name] 06/23/2020 Medical Record Number: [id number] Patient Account Number: [id number] Date of Birth/Sex: Treating RN: 19-Jul-1960 (59 y.o. [name] Primary Care Provider: [name] Other Clinician: Referring Provider: Treating Provider/Extender: [name], [name] in Treatment: 13 Diagnosis Coding ICD-10 Codes Code Description T81.31XD Disruption of external operation (surgical) wound, not elsewhere classified,  subsequent encounter S90.811D Abrasion, right foot, subsequent encounter 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 L97.322 Non-pressure chronic ulcer of left ankle with fat layer exposed L89.616 Pressure-induced deep tissue damage of right heel Facility Procedures CPT4 Code: [id number] Description: 11042 - DEB SUBQ TISSUE 20 SQ CM/< ICD-10 Diagnosis Description L97.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 layer exposed Modifier: Quantity: 1 Physician Procedures : CPT4 Code Description Modifier [id number] 11042 - WC PHYS SUBQ TISS 20 SQ CM ICD-10 Diagnosis Description L97.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 layer exposed Quantity: 1 Electronic Signature(s) Signed: 06/23/2020 5:33:44 PM By: Baltazar Najjar MD Entered By: Baltazar Najjar on 06/23/2020 12:49:40

## 2020-06-23 NOTE — Progress Notes (Addendum)
on 06/23/2020 11:24:13 -------------------------------------------------------------------------------- Wound Assessment Details Patient Name: Date of Service: [name], [name] 06/23/2020 10:45 A M Medical Record Number: [id number] Patient Account Number: [id number] Date of Birth/Sex: Treating RN: 08-21-1960 (60 y.o. [name] Primary Care [name]: [name] Other Clinician: Referring [name]: Treating [name]/Extender: [name], [name] in  Treatment: 13 Wound Status Wound Number: 46 Primary Pressure Ulcer Etiology: Wound Location: Right T Second oe Wound Healed - Epithelialized Wounding Event: Gradually Appeared Status: Date Acquired: 03/31/2020 Comorbid Anemia, Deep Vein Thrombosis, Colitis, Osteomyelitis, [name] Of Treatment: 12 History: Neuropathy, Quadriplegia, Confinement Anxiety Clustered Wound: No Photos Photo Uploaded By: [name] on 06/29/2020 10:02:27 Wound Measurements Length: (cm) Width: (cm) Depth: (cm) Area: (cm) Volume: (cm) 0 % Reduction in Area: 100% 0 % Reduction in Volume: 100% 0 Epithelialization: Large (67-100%) 0 Tunneling: No 0 Undermining: No Wound Description Classification: Category/Stage III Wound Margin: Flat and Intact Exudate Amount: None Present Wound Bed Granulation Amount: None Present (0%) Necrotic Amount: None Present (0%) Foul Odor After Cleansing: No Slough/Fibrino No Exposed Structure Fascia Exposed: No Fat Layer (Subcutaneous Tissue) Exposed: No Tendon Exposed: No Muscle Exposed: No Joint Exposed: No Bone Exposed: No Electronic Signature(s) Signed: 06/23/2020 5:52:57 PM By: [name] Abts RN, BSN Entered By: [name] on 06/23/2020 11:24:31 -------------------------------------------------------------------------------- Wound Assessment Details Patient Name: Date of Service: [name], [name] 06/23/2020 10:45 A M Medical Record Number: [id number] Patient Account Number: [id number] Date of Birth/Sex: Treating RN: 03-Feb-1961 (60 y.o. [name] Primary Care [name]: [name] Other Clinician: Referring [name]: Treating [name]/Extender: [name], [name] in Treatment: 13 Wound Status Wound Number: 47 Primary Venous Leg Ulcer Etiology: Wound Location: Left, Medial Malleolus Wound Open Wounding Event: Gradually Appeared Status: Date Acquired: 05/22/2020 Comorbid Anemia, Deep Vein Thrombosis, Colitis,  Osteomyelitis, [name] Of Treatment: 4 History: Neuropathy, Quadriplegia, Confinement Anxiety Clustered Wound: No Photos Photo Uploaded By: [name] on 06/29/2020 11:19:14 Wound Measurements Length: (cm) 2 Width: (cm) 2.8 Depth: (cm) 0.2 Area: (cm) 4.398 Volume: (cm) 0.88 % Reduction in Area: 42.9% % Reduction in Volume: -14.3% Epithelialization: Small (1-33%) Tunneling: No Undermining: No Wound Description Classification: Full Thickness Without Exposed Support Structures Wound Margin: Distinct, outline attached Exudate Amount: Medium Exudate Type: Serosanguineous Exudate Color: red, brown Foul Odor After Cleansing: No Slough/Fibrino Yes Wound Bed Granulation Amount: Medium (34-66%) Exposed Structure Granulation Quality: Pink Fascia Exposed: No Necrotic Amount: Medium (34-66%) Fat Layer (Subcutaneous Tissue) Exposed: Yes Necrotic Quality: Adherent Slough Tendon Exposed: No Muscle Exposed: No Joint Exposed: No Bone Exposed: No Treatment Notes Wound #47 (Malleolus) Wound Laterality: Left, Medial Cleanser Soap and Water Discharge Instruction: May shower and wash wound with dial antibacterial soap and water prior to dressing change. Wound Cleanser Discharge Instruction: Cleanse the wound with wound cleanser prior to applying a clean dressing using gauze sponges, not tissue or cotton balls. Peri-Wound Care Sween Lotion (Moisturizing lotion) Discharge Instruction: Apply moisturizing lotion with dressing changes Topical Primary Dressing Hydrofera Blue Classic Foam, 4x4 in Discharge Instruction: Apply to wound bed as instructed Secondary Dressing ABD Pad, 5x9 Discharge Instruction: Apply over primary dressing as directed. Secured With Compression Wrap Kerlix Roll 4.5x3.1 (in/yd) Discharge Instruction: Apply Kerlix and Coban compression , be sure to wrap from base of toes to tibial tuberosity. no too tight Coban Self-Adherent Wrap 4x5 (in/yd) Discharge  Instruction: Apply over Kerlix as directed. Compression Stockings Add-Ons Electronic Signature(s) Signed: 06/23/2020 5:52:57 PM By: [name] Abts RN, BSN Entered By: [name] on 06/23/2020 11:24:50 -------------------------------------------------------------------------------- Wound Assessment Details  [name], [name] ([id number]) Visit Report for 06/23/2020 Arrival Information Details Patient Name: Date of Service: [name] 06/23/2020 10:45 A M Medical Record Number: [id number] Patient Account Number: [id number] Date of Birth/Sex: Treating RN: 11-May-1961 (61 y.o. [name] Primary Care [name]: [name] Other Clinician: Referring [name]: Treating [name]/Extender: [name], [name] in Treatment: 13 Visit Information History Since Last Visit Added or deleted any medications: No Patient Arrived: Wheel Chair Any new allergies or adverse reactions: No Arrival Time: 10:45 Had a fall or experienced change in No Accompanied By: caregiver activities of daily living that may affect Transfer Assistance: None risk of falls: Patient Identification Verified: Yes Signs or symptoms of abuse/neglect since last visito No Secondary Verification Process Completed: Yes Hospitalized since last visit: No Patient Requires Transmission-Based Precautions: No Implantable device outside of the clinic excluding No Patient Has Alerts: No cellular tissue based products placed in the center since last visit: Has Dressing in Place as Prescribed: Yes Has Compression in Place as Prescribed: Yes Pain Present Now: No Electronic Signature(s) Signed: 06/23/2020 5:52:57 PM By: [name] Abts RN, BSN Entered By: [name] on 06/23/2020 11:18:32 -------------------------------------------------------------------------------- Complex / Palliative Patient Assessment Details Patient Name: Date of Service: [name], [name] 06/23/2020 10:45 A M Medical Record Number: [id number] Patient Account Number: [id number] Date of Birth/Sex: Treating RN: [birth year] (59 y.o. [name] Primary Care Starasia Sinko: [name] Other Clinician: Referring Saran Laviolette: Treating Florita Nitsch/Extender: [name], [name] in Treatment: 13 Palliative Management  Criteria Complex Wound Management Criteria Patient has remarkable or complex co-morbidities requiring medications or treatments that extend wound healing times. Examples: Diabetes mellitus with chronic renal failure or end stage renal disease requiring dialysis Advanced or poorly controlled rheumatoid arthritis Diabetes mellitus and end stage chronic obstructive pulmonary disease Active cancer with current chemo- or radiation therapy Osteomyelitis, Cerebral Palsy Care Approach Wound Care Plan: Complex Wound Management Electronic Signature(s) Signed: 06/30/2020 4:22:00 PM By: Baltazar Najjar MD Signed: 07/04/2020 5:05:26 PM By: [name] Abts RN, BSN Entered By: [name] on 06/30/2020 09:49:28 -------------------------------------------------------------------------------- Encounter Discharge Information Details Patient Name: Date of Service: [name], [name] 06/23/2020 10:45 A M Medical Record Number: [id number] Patient Account Number: [id number] Date of Birth/Sex: Treating RN: [birth year] (59 y.o. [name] Primary Care Lakenya Riendeau: [name] Other Clinician: Referring Starlee Corralejo: Treating Arleth Mccullar/Extender: [name], [name] in Treatment: 13 Encounter Discharge Information Items Post Procedure Vitals Discharge Condition: Stable Temperature (F): 97.7 Ambulatory Status: Wheelchair Pulse (bpm): 77 Discharge Destination: Home Respiratory Rate (breaths/min): 18 Transportation: Private Auto Blood Pressure (mmHg): 139/82 Accompanied By: caregiver Schedule Follow-up Appointment: Yes Clinical Summary of Care: Electronic Signature(s) Signed: 06/23/2020 5:42:38 PM By: Shawn Stall Entered By: Shawn Stall on 06/23/2020 14:16:14 -------------------------------------------------------------------------------- Lower Extremity Assessment Details Patient Name: Date of Service: [name], [name] 06/23/2020 10:45 A M Medical Record Number:  [id number] Patient Account Number: [id number] Date of Birth/Sex: Treating RN: [birth year] (60 y.o. [name] Primary Care Jase Himmelberger: [name] Other Clinician: Referring Wilford Merryfield: Treating Aleyssa Pike/Extender: [name], [name] in Treatment: 13 Edema Assessment Assessed: [Left: No] Franne Forts: No] Edema: [Left: Yes] [Right: Yes] Calf Left: Right: Point of Measurement: From Medial Instep 29 cm 26 cm Ankle Left: Right: Point of Measurement: From Medial Instep 19 cm 16 cm Electronic Signature(s) Signed: 06/23/2020 5:52:57 PM By: [name] Abts RN, BSN Entered By: [name] on 06/23/2020 11:19:19 -------------------------------------------------------------------------------- Multi-Disciplinary Care Plan Details Patient Name: Date of Service: [name] 06/23/2020 10:45 A M Medical Record Number: [id number]  Patient Name: Date of Service: [name], [name] 06/23/2020 10:45 A M Medical Record Number: [id number] Patient Account Number: [id number] Date of Birth/Sex: Treating RN: 1961/01/10 (60 y.o. [name] Primary Care Emilynn Srinivasan: [name] Other Clinician: Referring Kenzleigh Sedam: Treating Neddie Steedman/Extender: [name], [name] in Treatment: 13 Wound Status Wound Number: 48 Primary Pressure Ulcer Etiology: Wound Location: Right Calcaneus Wound Open Wounding Event: Pressure Injury Status: Date Acquired: 06/02/2020 Comorbid Anemia, Deep Vein Thrombosis, Colitis, Osteomyelitis, [name] Of Treatment: 3 History: Neuropathy, Quadriplegia, Confinement Anxiety Clustered Wound: No Photos Photo Uploaded By: [name] on 06/29/2020 11:18:50 Wound Measurements Length: (cm) 3.2 Width: (cm) 5 Depth: (cm) 0.1 Area: (cm) 12.566 Volume: (cm) 1.257 % Reduction in Area: -204.8% % Reduction in Volume: -205.1% Epithelialization: None Tunneling: No Undermining: No Wound Description Classification: Unstageable/Unclassified Wound Margin: Flat and Intact Exudate Amount: Small Exudate Type: Serosanguineous Exudate Color: red, brown Foul Odor After Cleansing: No Slough/Fibrino No Wound Bed Granulation Amount: Small (1-33%) Exposed Structure Granulation Quality: Pink Fascia Exposed: No Necrotic Amount: Large (67-100%) Fat Layer (Subcutaneous Tissue) Exposed: Yes Necrotic Quality: Eschar Tendon Exposed: No Muscle Exposed: No Joint Exposed: No Bone Exposed: No Treatment Notes Wound #48 (Calcaneus) Wound Laterality: Right Cleanser Soap and  Water Discharge Instruction: May shower and wash wound with dial antibacterial soap and water prior to dressing change. Wound Cleanser Discharge Instruction: Cleanse the wound with wound cleanser prior to applying a clean dressing using gauze sponges, not tissue or cotton balls. Peri-Wound Care Sween Lotion (Moisturizing lotion) Discharge Instruction: Apply moisturizing lotion with dressing changes Topical Bactroban Discharge Instruction: apply to wound bed under alginate Primary Dressing KerraCel Ag Gelling Fiber Dressing, 4x5 in (silver alginate) Discharge Instruction: Apply silver alginate to wound bed as instructed Secondary Dressing Woven Gauze Sponge, Non-Sterile 4x4 in Discharge Instruction: Apply over primary dressing as directed. ABD Pad, 5x9 Discharge Instruction: Apply over primary dressing as directed. ALLEVYN Heel 4 1/2in x 5 1/2in / 10.5cm x 13.5cm Discharge Instruction: Apply over primary dressing as directed. Secured With Compression Wrap Kerlix Roll 4.5x3.1 (in/yd) Discharge Instruction: Apply Kerlix and Coban compression as directed. Coban Self-Adherent Wrap 4x5 (in/yd) Discharge Instruction: Apply over Kerlix as directed. Compression Stockings Add-Ons Electronic Signature(s) Signed: 06/23/2020 5:52:57 PM By: [name] Abts RN, BSN Entered By: [name] on 06/23/2020 11:25:05 -------------------------------------------------------------------------------- Vitals Details Patient Name: Date of Service: [name]. 06/23/2020 10:45 A M Medical Record Number: [id number] Patient Account Number: [id number] Date of Birth/Sex: Treating RN: March 18, 1961 (60 y.o. [name] Primary Care Casper Pagliuca: [name] Other Clinician: Referring Jessup Ogas: Treating Hlee Fringer/Extender: [name], [name] in Treatment: 13 Vital Signs Time Taken: 10:47 Temperature (F): 97.7 Pulse (bpm): 77 Respiratory Rate (breaths/min): 16 Blood  Pressure (mmHg): 139/82 Reference Range: 80 - 120 mg / dl Electronic Signature(s) Signed: 06/23/2020 5:52:57 PM By: [name] Abts RN, BSN Entered By: [name] on 06/23/2020 11:18:54  Patient Account Number: [id number] Date of Birth/Sex: Treating RN: [birth year] (60 y.o. [name] Primary Care Muhsin Doris: [name] Other Clinician: Referring Llewellyn Schoenberger: Treating Annalisia Ingber/Extender: [name], [name] in Treatment: 13 Active Inactive Wound/Skin Impairment Nursing Diagnoses: Knowledge deficit related to ulceration/compromised skin integrity Goals: Patient/caregiver will verbalize understanding of skin care regimen Date Initiated: 03/23/2020 Target Resolution Date: 07/21/2020 Goal Status: Active Ulcer/skin breakdown will have a volume reduction of 30% by week 4 Date Initiated: 03/31/2020 Date Inactivated: 04/28/2020 Target Resolution Date: 04/21/2020 Goal Status: Unmet Unmet Reason: infection, Ulcer/skin breakdown will have a volume reduction of 50% by week 8 Date Initiated: 04/28/2020 Date Inactivated: 05/22/2020 Target Resolution Date: 05/19/2020 Goal Status: Unmet Unmet Reason: eschar, infection Ulcer/skin breakdown will have a volume reduction of 80% by week 12 Date Initiated:  05/22/2020 Date Inactivated: 06/23/2020 Target Resolution Date: 07/21/2020 Goal Status: Unmet Unmet Reason: infection Interventions: Assess patient/caregiver ability to perform ulcer/skin care regimen upon admission and as needed Provide education on ulcer and skin care Treatment Activities: Skin care regimen initiated : 03/23/2020 Topical wound management initiated : 03/23/2020 Notes: Electronic Signature(s) Signed: 06/23/2020 5:52:16 PM By: Zenaida Deed RN, BSN Entered By: Zenaida Deed on 06/23/2020 11:34:26 -------------------------------------------------------------------------------- Pain Assessment Details Patient Name: Date of Service: NYMIR, RINGLER 06/23/2020 10:45 A M Medical Record Number: [id number] Patient Account Number: [id number] Date of Birth/Sex: Treating RN: Aug 31, 1960 (60 y.o. [name] Primary Care Teria Khachatryan: [name] Other Clinician: Referring Traniyah Hallett: Treating Alissia Lory/Extender: [name], [name] in Treatment: 13 Active Problems Location of Pain Severity and Description of Pain Patient Has Paino No Site Locations Pain Management and Medication Current Pain Management: Electronic Signature(s) Signed: 06/23/2020 5:52:57 PM By: [name] Abts RN, BSN Entered By: [name] on 06/23/2020 11:18:59 -------------------------------------------------------------------------------- Patient/Caregiver Education Details Patient Name: Date of Service: [name] 1/28/2022andnbsp10:45 A M Medical Record Number: [id number] Patient Account Number: [id number] Date of Birth/Gender: Treating RN: May 18, 1961 (60 y.o. [name] Primary Care Physician: [name] Other Clinician: Referring Physician: Treating Physician/Extender: Lamarr Lulas in Treatment: 13 Education Assessment Education Provided To: Patient Education Topics Provided Pressure: Methods:  Explain/Verbal Responses: Reinforcements needed, State content correctly Wound Debridement: Methods: Explain/Verbal Responses: Reinforcements needed, State content correctly Wound/Skin Impairment: Methods: Explain/Verbal Responses: Reinforcements needed, State content correctly Electronic Signature(s) Signed: 06/23/2020 5:52:16 PM By: Zenaida Deed RN, BSN Entered By: Zenaida Deed on 06/23/2020 11:35:22 -------------------------------------------------------------------------------- Wound Assessment Details Patient Name: Date of Service: [name], [name] 06/23/2020 10:45 A M Medical Record Number: [id number] Patient Account Number: [id number] Date of Birth/Sex: Treating RN: 1960-07-09 (60 y.o. [name] Primary Care Neshawn Aird: [name] Other Clinician: Referring Kameran Lallier: Treating Olden Klauer/Extender: [name], [name] in Treatment: 13 Wound Status Wound Number: 41 Primary Lymphedema Etiology: Wound Location: Right T Fourth oe Wound Open Wounding Event: Trauma Status: Date Acquired: 02/21/2020 Comorbid Anemia, Deep Vein Thrombosis, Colitis, Osteomyelitis, [name] Of Treatment: 13 History: Neuropathy, Quadriplegia, Confinement Anxiety Clustered Wound: No Photos Photo Uploaded By: [name] on 06/29/2020 10:02:27 Wound Measurements Length: (cm) 0.2 Width: (cm) 0.2 Depth: (cm) 0.2 Area: (cm) 0.031 Volume: (cm) 0.006 % Reduction in Area: 97.8% % Reduction in Volume: 95.7% Epithelialization: Medium (34-66%) Tunneling: No Undermining: No Wound Description Classification: Full Thickness Without Exposed Support Structures Wound Margin: Flat and Intact Exudate Amount: Small Exudate Type: Serosanguineous Exudate Color: red, brown Foul Odor After Cleansing: No Slough/Fibrino No Wound Bed Granulation Amount: Large (67-100%) Exposed Structure Granulation Quality: Red Fascia Exposed: No Necrotic Amount: None Present (0%)  on 06/23/2020 11:24:13 -------------------------------------------------------------------------------- Wound Assessment Details Patient Name: Date of Service: [name], [name] 06/23/2020 10:45 A M Medical Record Number: [id number] Patient Account Number: [id number] Date of Birth/Sex: Treating RN: 08-21-1960 (60 y.o. [name] Primary Care [name]: [name] Other Clinician: Referring [name]: Treating [name]/Extender: [name], [name] in  Treatment: 13 Wound Status Wound Number: 46 Primary Pressure Ulcer Etiology: Wound Location: Right T Second oe Wound Healed - Epithelialized Wounding Event: Gradually Appeared Status: Date Acquired: 03/31/2020 Comorbid Anemia, Deep Vein Thrombosis, Colitis, Osteomyelitis, [name] Of Treatment: 12 History: Neuropathy, Quadriplegia, Confinement Anxiety Clustered Wound: No Photos Photo Uploaded By: [name] on 06/29/2020 10:02:27 Wound Measurements Length: (cm) Width: (cm) Depth: (cm) Area: (cm) Volume: (cm) 0 % Reduction in Area: 100% 0 % Reduction in Volume: 100% 0 Epithelialization: Large (67-100%) 0 Tunneling: No 0 Undermining: No Wound Description Classification: Category/Stage III Wound Margin: Flat and Intact Exudate Amount: None Present Wound Bed Granulation Amount: None Present (0%) Necrotic Amount: None Present (0%) Foul Odor After Cleansing: No Slough/Fibrino No Exposed Structure Fascia Exposed: No Fat Layer (Subcutaneous Tissue) Exposed: No Tendon Exposed: No Muscle Exposed: No Joint Exposed: No Bone Exposed: No Electronic Signature(s) Signed: 06/23/2020 5:52:57 PM By: [name] Abts RN, BSN Entered By: [name] on 06/23/2020 11:24:31 -------------------------------------------------------------------------------- Wound Assessment Details Patient Name: Date of Service: [name], [name] 06/23/2020 10:45 A M Medical Record Number: [id number] Patient Account Number: [id number] Date of Birth/Sex: Treating RN: 03-Feb-1961 (60 y.o. [name] Primary Care [name]: [name] Other Clinician: Referring [name]: Treating [name]/Extender: [name], [name] in Treatment: 13 Wound Status Wound Number: 47 Primary Venous Leg Ulcer Etiology: Wound Location: Left, Medial Malleolus Wound Open Wounding Event: Gradually Appeared Status: Date Acquired: 05/22/2020 Comorbid Anemia, Deep Vein Thrombosis, Colitis,  Osteomyelitis, [name] Of Treatment: 4 History: Neuropathy, Quadriplegia, Confinement Anxiety Clustered Wound: No Photos Photo Uploaded By: [name] on 06/29/2020 11:19:14 Wound Measurements Length: (cm) 2 Width: (cm) 2.8 Depth: (cm) 0.2 Area: (cm) 4.398 Volume: (cm) 0.88 % Reduction in Area: 42.9% % Reduction in Volume: -14.3% Epithelialization: Small (1-33%) Tunneling: No Undermining: No Wound Description Classification: Full Thickness Without Exposed Support Structures Wound Margin: Distinct, outline attached Exudate Amount: Medium Exudate Type: Serosanguineous Exudate Color: red, brown Foul Odor After Cleansing: No Slough/Fibrino Yes Wound Bed Granulation Amount: Medium (34-66%) Exposed Structure Granulation Quality: Pink Fascia Exposed: No Necrotic Amount: Medium (34-66%) Fat Layer (Subcutaneous Tissue) Exposed: Yes Necrotic Quality: Adherent Slough Tendon Exposed: No Muscle Exposed: No Joint Exposed: No Bone Exposed: No Treatment Notes Wound #47 (Malleolus) Wound Laterality: Left, Medial Cleanser Soap and Water Discharge Instruction: May shower and wash wound with dial antibacterial soap and water prior to dressing change. Wound Cleanser Discharge Instruction: Cleanse the wound with wound cleanser prior to applying a clean dressing using gauze sponges, not tissue or cotton balls. Peri-Wound Care Sween Lotion (Moisturizing lotion) Discharge Instruction: Apply moisturizing lotion with dressing changes Topical Primary Dressing Hydrofera Blue Classic Foam, 4x4 in Discharge Instruction: Apply to wound bed as instructed Secondary Dressing ABD Pad, 5x9 Discharge Instruction: Apply over primary dressing as directed. Secured With Compression Wrap Kerlix Roll 4.5x3.1 (in/yd) Discharge Instruction: Apply Kerlix and Coban compression , be sure to wrap from base of toes to tibial tuberosity. no too tight Coban Self-Adherent Wrap 4x5 (in/yd) Discharge  Instruction: Apply over Kerlix as directed. Compression Stockings Add-Ons Electronic Signature(s) Signed: 06/23/2020 5:52:57 PM By: [name] Abts RN, BSN Entered By: [name] on 06/23/2020 11:24:50 -------------------------------------------------------------------------------- Wound Assessment Details  on 06/23/2020 11:24:13 -------------------------------------------------------------------------------- Wound Assessment Details Patient Name: Date of Service: [name], [name] 06/23/2020 10:45 A M Medical Record Number: [id number] Patient Account Number: [id number] Date of Birth/Sex: Treating RN: 08-21-1960 (60 y.o. [name] Primary Care [name]: [name] Other Clinician: Referring [name]: Treating [name]/Extender: [name], [name] in  Treatment: 13 Wound Status Wound Number: 46 Primary Pressure Ulcer Etiology: Wound Location: Right T Second oe Wound Healed - Epithelialized Wounding Event: Gradually Appeared Status: Date Acquired: 03/31/2020 Comorbid Anemia, Deep Vein Thrombosis, Colitis, Osteomyelitis, [name] Of Treatment: 12 History: Neuropathy, Quadriplegia, Confinement Anxiety Clustered Wound: No Photos Photo Uploaded By: [name] on 06/29/2020 10:02:27 Wound Measurements Length: (cm) Width: (cm) Depth: (cm) Area: (cm) Volume: (cm) 0 % Reduction in Area: 100% 0 % Reduction in Volume: 100% 0 Epithelialization: Large (67-100%) 0 Tunneling: No 0 Undermining: No Wound Description Classification: Category/Stage III Wound Margin: Flat and Intact Exudate Amount: None Present Wound Bed Granulation Amount: None Present (0%) Necrotic Amount: None Present (0%) Foul Odor After Cleansing: No Slough/Fibrino No Exposed Structure Fascia Exposed: No Fat Layer (Subcutaneous Tissue) Exposed: No Tendon Exposed: No Muscle Exposed: No Joint Exposed: No Bone Exposed: No Electronic Signature(s) Signed: 06/23/2020 5:52:57 PM By: [name] Abts RN, BSN Entered By: [name] on 06/23/2020 11:24:31 -------------------------------------------------------------------------------- Wound Assessment Details Patient Name: Date of Service: [name], [name] 06/23/2020 10:45 A M Medical Record Number: [id number] Patient Account Number: [id number] Date of Birth/Sex: Treating RN: 03-Feb-1961 (60 y.o. [name] Primary Care [name]: [name] Other Clinician: Referring [name]: Treating [name]/Extender: [name], [name] in Treatment: 13 Wound Status Wound Number: [id number] Primary Venous Leg Ulcer Etiology: Wound Location: Left, Medial Malleolus Wound Open Wounding Event: Gradually Appeared Status: Date Acquired: 05/22/2020 Comorbid Anemia, Deep Vein Thrombosis, Colitis,  Osteomyelitis, [name] Of Treatment: 4 History: Neuropathy, Quadriplegia, Confinement Anxiety Clustered Wound: No Photos Photo Uploaded By: [name] on 06/29/2020 11:19:14 Wound Measurements Length: (cm) 2 Width: (cm) 2.8 Depth: (cm) 0.2 Area: (cm) 4.398 Volume: (cm) 0.88 % Reduction in Area: 42.9% % Reduction in Volume: -14.3% Epithelialization: Small (1-33%) Tunneling: No Undermining: No Wound Description Classification: Full Thickness Without Exposed Support Structures Wound Margin: Distinct, outline attached Exudate Amount: Medium Exudate Type: Serosanguineous Exudate Color: red, brown Foul Odor After Cleansing: No Slough/Fibrino Yes Wound Bed Granulation Amount: Medium (34-66%) Exposed Structure Granulation Quality: Pink Fascia Exposed: No Necrotic Amount: Medium (34-66%) Fat Layer (Subcutaneous Tissue) Exposed: Yes Necrotic Quality: Adherent Slough Tendon Exposed: No Muscle Exposed: No Joint Exposed: No Bone Exposed: No Treatment Notes Wound #47 (Malleolus) Wound Laterality: Left, Medial Cleanser Soap and Water Discharge Instruction: May shower and wash wound with dial antibacterial soap and water prior to dressing change. Wound Cleanser Discharge Instruction: Cleanse the wound with wound cleanser prior to applying a clean dressing using gauze sponges, not tissue or cotton balls. Peri-Wound Care Sween Lotion (Moisturizing lotion) Discharge Instruction: Apply moisturizing lotion with dressing changes Topical Primary Dressing Hydrofera Blue Classic Foam, 4x4 in Discharge Instruction: Apply to wound bed as instructed Secondary Dressing ABD Pad, 5x9 Discharge Instruction: Apply over primary dressing as directed. Secured With Compression Wrap Kerlix Roll 4.5x3.1 (in/yd) Discharge Instruction: Apply Kerlix and Coban compression , be sure to wrap from base of toes to tibial tuberosity. no too tight Coban Self-Adherent Wrap 4x5 (in/yd) Discharge  Instruction: Apply over Kerlix as directed. Compression Stockings Add-Ons Electronic Signature(s) Signed: 06/23/2020 5:52:57 PM By: [name] Abts RN, BSN Entered By: [name] on 06/23/2020 11:24:50 -------------------------------------------------------------------------------- Wound Assessment Details

## 2020-06-27 DIAGNOSIS — Z899 Acquired absence of limb, unspecified: Secondary | ICD-10-CM

## 2020-06-27 HISTORY — DX: Acquired absence of limb, unspecified: Z89.9

## 2020-07-07 ENCOUNTER — Encounter (HOSPITAL_BASED_OUTPATIENT_CLINIC_OR_DEPARTMENT_OTHER): Payer: Medicare Other | Attending: Internal Medicine | Admitting: Internal Medicine

## 2020-07-07 ENCOUNTER — Other Ambulatory Visit: Payer: Self-pay

## 2020-07-07 DIAGNOSIS — G808 Other cerebral palsy: Secondary | ICD-10-CM | POA: Diagnosis not present

## 2020-07-07 DIAGNOSIS — L97312 Non-pressure chronic ulcer of right ankle with fat layer exposed: Secondary | ICD-10-CM | POA: Insufficient documentation

## 2020-07-07 DIAGNOSIS — L89616 Pressure-induced deep tissue damage of right heel: Secondary | ICD-10-CM | POA: Insufficient documentation

## 2020-07-07 DIAGNOSIS — L97322 Non-pressure chronic ulcer of left ankle with fat layer exposed: Secondary | ICD-10-CM | POA: Diagnosis not present

## 2020-07-07 DIAGNOSIS — S90811D Abrasion, right foot, subsequent encounter: Secondary | ICD-10-CM | POA: Diagnosis not present

## 2020-07-07 DIAGNOSIS — X58XXXD Exposure to other specified factors, subsequent encounter: Secondary | ICD-10-CM | POA: Diagnosis not present

## 2020-07-07 DIAGNOSIS — L97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 layer exposed: Secondary | ICD-10-CM | POA: Diagnosis present

## 2020-07-07 DIAGNOSIS — T8131XD Disruption of external operation (surgical) wound, not elsewhere classified, subsequent encounter: Secondary | ICD-10-CM | POA: Diagnosis not present

## 2020-07-07 NOTE — Progress Notes (Signed)
HARTSEL, MAINER ([id number]) Visit Report for 07/07/2020 HPI Details Patient Name: Date of Service: [name], [name] 07/07/2020 10:45 A M Medical Record Number: [id number] Patient Account Number: [id number] Date of Birth/Sex: Treating RN: 06-18-1960 (60 y.o. [name] Primary Care Provider: [name] Other Clinician: Referring Provider: Treating Provider/Extender: [name], [name] in Treatment: 15 History of Present Illness HPI Description: 11/23/15; this is a patient I remember from a previous stay in this clinic in 2012. The patient says this was for a wound in this same area as currently although I have no information to verify that. Most of the information is provided by his caregiver from the group home where he resides. Apparently he has had a wound in this area for 8 months. He also has erythema around this area and has had multiple rounds of antibiotics apparently with some improvement but the erythema is persists. He apparently had a fracture in the ankle 2 months ago and was seen by Dr. Lajoyce Corners . He apparently had a splint applied and more recently a heel offloading boot. He had home health coming out to a week ago not clear what they are dressing this with but they apparently discharged him perhaps because of one of Dr. 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 He has a Nurse, adult for transferring at home he is nonambulatory and does not wear footwear. He is not a diabetic. Looking through [facility] he appears to have fairly active primary care. The area on the ankle is variably labeled as a pressure area and/or chronic venous insufficiency. He had a recent venous ultrasound on 5/25 that did not show a DVT in the left leg however this was not a reflux study. There was no superficial DVT either. Arterial studies on 10/15/14 showed a left ABI of 1.16 Center right of 1.12 waveforms were triphasic he does not  have significant arterial disease. He has had recent x-rays of the left foot and ankle. The left foot did not show any abnormality. Left ankle x-ray showed a spiral fracture of the left tibial diaphysis without evidence of osteomyelitis. 11/30/15 culture I did last week showed pseudomonas I changed him to oral ciprofloxacin. Erythema is a lot better. 12/07/15 area around the wound shows considerable improvement of the periwound erythema. 12/14/15; the patient has a improvement of the periwound erythema which in retrospect I think with cellulitis successfully treated. We have been using Iodoflex started last week 12/25/15 wound on the left medial malleolus and dorsal left foot. Theraskin #1 applied READMISSION 02/27/16; the patient was admitted to hospital from 8/14 through 01/18/16 initially felt to have cellulitis of his left lower leg however he was noted to have significant persistent pain and swelling. A duplex ultrasound was ordered that was negative. Obie Dredge an x-ray was done that showed a spiral fracture of the left leg. With posterior displacement and angulation at the mid left tibia. Orthopedics saw the patient and splinted the leg. He was then sent to [facility] skilled facility and only return to his group home last week. He has wounds on the left medial malleolus, left dorsal foot and a worrisome area on the plantar left heel which is not really open but may represent a hematoma or a DTI 03/06/16; areas of left anterior and medial ankle both look improved. There are 3 small wounds here. He has a area over the dorsal left first metatarsal head which is not open. In meantime his left  heel has opened 03/14/16 the medial left ankle wounds looks stable to improved. The area on the posterior left heel has opened up and had a very adherent eschar and slept over this 03/21/16 patient has 3 wounds on his left medial ankle is stable area on his left dorsal first metatarsal head appears to be improved.  Continued problems with adherent eschar over the left heel 03/28/16; patient has 3 wounds on his left medial ankle and lower leg all of these appear to be stable. The problem continues to be the left heel and the adherent eschar. This may have been a pressure ulcer from her brace at one point. He has a English as a second language teacher we have been using [employer] and all wound areas. Dressings are being changed by home health 04/04/16; the patient's areas on the medial aspect of the left ankle looks superficial and appear to be gradually closing. The area on the plantar aspects/Achilles aspect of his left heel as surface slough and nonviable subcutaneous tissue requiring debridement. 04/11/16; the areas on the patient's medial left ankle appear to be superficial and appear to be closing. The area on the plantar heel on the left has surface slough and nonviable subcutaneous tissue requiring debridement. I don't think this is too much different from last week there is also undermining 04/25/16; patient is followed in his group home by [facility] home health. The area on the plantar heel is smaller but with still with some depth. We're using Iodoflex to this. The areas on the left ankle looks healthy but dry and somewhat larger. Cause of this is not really clear using Prisma 05/02/16; areas x3 all look improved expecially the areas on the medial ankle. Heel wound is smaller but still deep 05/09/16; all areas 3 look improved on the medial ankle. Heel wound is also contracting. 12/0.9/17 the areas on his left medial ankle are all closed. I think these are largely chronic venous insufficiency however there is no edema. The area on his left heel was a pressure area. READMISSION 08/02/16; this man is a patient that we have had in this clinic on several times before. I believe he has cerebral palsy is nonambulatory. He has chronic venous insufficiency with venous inflammation. He was last year in the fall of 2017 with areas on his  left medial ankle left heel and left dorsal foot. He arrives today with once again a vague history. As mentioned he is nonambulatory he spends most of his day in a pressure offloading boot nevertheless they noted wounds in this area on the medial left ankle 2 weeks ago. There is any other history here I am uncertain 08/15/16; the patient's wounds on his medial ankle actually look better however the area on the left lateral/dorsal foot is covered in black necrotic eschar. Previous formal arterial studies did not show significant arterial disease with normal ABIs and triphasic waveforms. He is immobilized and at risk for pressure although he seems aware protective boots at all times as far as I am aware. 08/22/16; in general his wounds look better. All the wounds on his medial left ankle look improved the area on his lateral foot however continues to be problematic. Patient comes in the clinic today in a new wheelchair he is quite delighted 09/05/16; a wound on the left medial Calf and left dorsal foot are healed. The area on the left lateral foot is improved however the area on the left medial malleolus is inflamed with a considerable degree of surrounding erythema. This  looks like cellulitis. ABIs and clinical exam are within normal limits 09/12/16; the patient continues to have a wound on the left medial calf. I was concerned about cellulitis in this area last week and gave him a course of doxycycline. The wound looks better here. The area on the left lateral foot however is unchanged 09/20/16; the patient came in this week early after traumatizing his toe on the left foot on a table while moving around in his motorized wheelchair. 09/26/16- he is here for follow-up evaluation of his left foot and malleolus ulcers. Since his appointment he abraded his left medial hallux against a table while in his motorized wheelchair, apparently trying to pull up to the table for a meal. He continues to complain of  tenderness to the left lateral foot, surrounding the current ulcer 10/07/16; left foot and malleolus ulcers. 10/14/16; the patient is doing well. He has a wound on the left lateral, left medial malleolus and atraumatic wound on the left great toe. Although these appear to be superficial and closing 10/31/16; the patient is doing well. The area on the left lateral foot, left medial malleolus and the left great toe or all epithelialized. He has home health 11/14/16; patient arrives today with the area on the left great toe healed. The left lateral foot however in the left medial malleolus and the ankle superficial wounds that are slightly larger. He has erythema and tenderness around the area on the left lateral foot. As well he is complaining of dysuria. His caregiver notes that when he gets like this he is more lethargic which indeed seems to be the case today 11/21/16; left great toe is still healed. His wound on the left lateral foot appears healthy and slightly smaller also the area on the left medial ankle. This had black eschar on it which I removed. The remaining wound looks satisfactory 12/12/16; patient hasn't been here in a number of weeks. The area on the left lateral foot is healed however on his medial ankle has 2 open areas one of them covered with a necrotic eschar. He also has a new wound on the right second toe which was apparently trauma induced while he was at a movie 12/27/16; the patient has wounds on his left medial mallelus and left lateral foot that are just about closed. The area on the right second toe which was trauma from last time is still open but again everything looks a lot better here. 01/30/17; patient arrives in clinic today and is really been a long time since I've seen him. The wounds are all epithelialized on the left foot this includes his left lateral foot, left medial malleolus. Apparently was in the ER for what of I think was a fungal infection on his buttock's he was  prescribed Diflucan and doxycycline I think this is already getting better. READMISSION T is a now 60 year old man who has advanced cerebral palsy and functional quadriplegia. We have had him in this clinic multiple times before for wounds erry on his right ankle, left ankle, left dorsal foot and most recently from 08/02/16 through 01/30/17 with his left medial ankle and left foot and finally right second toe. His attendant from the group home where he lives is very knowledgeable about him. She states that they noticed breakdown in his skin in his feet sometime in January although they could not get an appointment until today in this clinic. The previous wounds have always been felt to be secondary to incidental trauma/pressure areas  or possibly some degree of chronic venous insufficiency. He had normal arterial studies in may of 2016. He has definite open wounds on the right lateral foot over the base of the fifth metatarsal, the right second toe DIP, the left medial malleolus. He has concerning areas over the left lateral fifth metatarsal base, retaform purpura over the medial left ankle and perhaps the medial left first toe. 07/21/17; culture we did last week from the base of the right fifth metatarsal grew group G strep. This should've been sensitive to the doxycycline I gave him. The other major wounds are on the right second toe DIP, left medial malleolus, base of the left lateral fifth metatarsal and the medial left ankle and medial left toe. 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 We have macrovascular arterial studies scheduled for Thursday although I don't think this is what the problem is. 07/28/17; patient arrives with really no improvement. He has a wound on the right second toe, lateral right foot, right medial malleolus which was new this week, left medial malleolus, but looked like a DTI on the left great toe. His macrovascular studies are on 07/30/17 although I would be  surprised if this provides all the explanation. He has small areas even on his right second and third toe which look like splinter-type hemorrhages. The question I have is where is this coming from. I'd like to see the vascular studies however before we pursue this. He has had a DVT in the past but is no longer on anticoagulants. I'll need to review his history 08/04/17; the patient had his ARTERIAL STUDIES this showed a right ABI of 1.16 and the left ABI of 1.20. TBI on the left at 0.84. He is at a right first toe amputation. The studies were unchanged from studies in May 2016. It was not felt that he had significant lower extremity arterial disease In the meantime we are using silver alginate to the wounds on the right second toe right lateral foot left medial malleolus. I'm going to change to Silver collagen today. 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 Working him up for this would include an echocardiogram probably a CT scan of the chest and abdomen to look at the major thoracic and abdominal aortic vessels. I am not going to involve myself in this unless there is more symptoms/signs 08/18/17; the patient arrives today with wounds looking somewhat better. The remaining areas are the right second toe dorsal, right lateral foot and left medial malleolus which only has a small open area. We've been using silver collagen 09/01/17;right second toe dorsally probes to bone today. This is a deterioration. We've been using silver collagen. Only a small open area remains on the left medial malleolus. 09/09/17; right second toe appears better. There is no probing bone. He has a small area remaining on the left medial malleolus, the right lateral foot. We have been using silver collagen 10/07/17; the patient returns to clinic today after a month hiatus. He has wounds over the right  second toe, right lateral foot and the left medial malleolus. We have been using silver collagen. He has home health. 10/24/17; patient has open wounds over the right second toe, now the left fourth toe and a superficial area over his left buttock. Some of the other areas have healed including the left medial malleolus and right lateral foot. Using silver collagen on these  wounds 11/07/17; the patient has closed his wounds on the right second toe and left fourth toe although they look all normal. Last time he was here he had a superficial area over the left buttock this is also closed. The right lateral foot wound I had closed out last time although it is seems to have reopened today. 628/19 the areas over the right second and left fourth toe remains closed. The right lateral foot has tightly adherent necrotic surface over. We have been using silver collagen he offloads this and a bunny boot 12/12/17; the patient has reopened the recurrent area over the right second PIP joint. 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 He does not have obvious macrovascular disease. The area over the right lateral foot looks about the same as last time tightly adherent necrotic debris. We've been using Hydrofera Blue 12/25/17; 2 week follow-up. The area over the right second PIP is closed however the area on the right lateral foot is worse with surrounding erythema and pain compatible with cellulitis is going to need an antibiotic. AS WELL..... We have looked at his buttocks and lower back. He has 3 stage II ulcers. The exact history here is unclear however he is very disabled man with cerebral palsy. He goes to a day program from the group home in which she lives 5 days a week and I think he is on his buttock all day on these visits. Surrounding the wounds see has extensive stage I injury as well. FINALLY it looks as though he has lost weight. I'm not the only  staff member that is recognized this 01/08/18 on evaluation today patient appears to be doing better in regard to his gluteal wounds. In fact everything appears to be healed back here although very dry. He has a couple open sores on the scrotal region other than this it is really his foot that is still giving him the most trouble. He did complete the Keflex unfortunately it seems like there's still a lot of erythema in this in fact looks worse than it did previous. I'm gonna culture this today and likely start with a different antibiotic. 01/16/18; I was reassured that the wounds on his buttocks and sacrum are healed. I did not look at these the day. The culture that was done last week on 01/11/18 of the right lateral foot wounds showed Pseudomonas. Bactrim is not a good choice for this. Ciprofloxacin interacts with his muscle relaxants therefore I prescribed cefdinir 300 twice a day for 7 days. Bactrim coverage of pseudomonas is not reliable although the degree of erythema looks a lot better 01/30/18; the patient has completed his antibiotics. The area does not have infection on the right lateral foot. The wounds look a lot better. We've been using silver alginate 02/12/18; there is on the right lateral foot. Copious amounts of surface eschar. Also similarly over the PIP of the right second toe. We've been using silver alginate to date 03/02/2018; the area that remains is on the right lateral foot. The right second toe is healed. 03/16/2018; the area there is original wound on the right lateral foot. Still requiring debridement. We are using Hydrofera Blue. The right second toe is still healed/closed 03/30/2018; right lateral foot there is 2 small open areas remaining. They actually look quite healthy we have been using Hydrofera Blue the right second toe remains closed 04/13/2018; right lateral foot. 1 of the 2 small open  areas from last time has closed over. The other had surface slough that I removed  with a #3 curette. This looks quite healthy. The right second toe remains closed. We are using Hydrofera Blue 05/01/2018; right lateral foot very superficial areas that look like they are trying to close over. He had a new threatened area on the left medial ankle just below the medial malleolus. I think these represent chronic venous insufficiency areas 05/14/2018; the right right lateral foot looks like it closed over as was predicted last time however he has some dark areas subcutaneously erythema superiorly and this is really very tender. This is either coexistent cellulitis or perhaps a deep tissue injury. The area on the left medial ankle is superficial but has some size. We have been using Hydrofera Blue to both areas. The patient complains of pain in his right foot 06/09/2018; right lateral foot was closed over last time but there was coexistent cellulitis around this. I gave him Levaquin. He also has an area on the left medial malleolus. He has not been back to clinic in over 3-1/2 weeks. We are using Hydrofera Blue I think to the left medial malleolus and silver alginate to the right lateral foot 1/21; right lateral foot still non-viable debris over this area and the left medial malleolus. Both of these vigorously washed off with antiseptic gauze. We have been using silver alginate. 2/4; right lateral foot this looks somewhat better. Unfortunately the area over the left medial malleolus has deteriorated. Using silver alginate in both area 2/18; both wounds arrived today covered in a lot of necrotic debris. We have been using silver alginate. Clearly not making a lot of progress. 3/3; the patient's right second toe is reopened over the PIP. I am not sure why this is happening. He may have microvascular disease. We have checked his macrovascular flow in the past and found it to be normal. He may need an x-ray. The area on the right lateral foot requires debridement. The area on the left lateral  ankle is worse. We have been using PolyMem Ag 08/26/18 on evaluation today patient appears to be doing a little worse in regard to his lower extremities based on what I'm seeing what the nursing staff is telling me. His left medial ankle appears potential to be infected there is your thing on want surrounding the wound. His right second toe is also of concern at this point in my pinion. We have not done x-rays of these areas that may be a good idea to go ahead and proceed with at this point to ensure will not deal with anything such as osteomyelitis. 4/16; it is been a while since I saw this patient. He now has wounds on the right lateral foot, dorsal right second toe, substantially on the left medial malleolus and a small area on the left lateral foot. We have been using PolyMem Silver. Curlex and Coban. He has home health changing the dressing. Since last time he was here he had a culture of the left medial ankle that showed MRSA. He is completed a course/7 days of doxycycline. XRAYS of the left ankle and right foot that were ordered were negative for osteomyelitis 4/30; he arrives in clinic today with some maceration around the wound and a lot of tenderness and erythema in a circumferential area around the right lateral foot. He has wound areas on the right lateral foot dorsal right second toe, largely over the left medial malleolus and a small area over  the left lateral foot 5/7; completed his antibiotics from last time right lateral foot cellulitis is better and the area on his left knee which we discovered after he had been seen by the provider also is improved. He still has extensive wound areas over the right lateral foot, left medial malleolus, right second toe and left lateral foot although all of these look somewhat better with PolyMem 5/15. Wounds on the right lateral foot, right second toe left medial malleolus and the left lateral foot. All of these look somewhat better. We have been  using PolyMem Ag 5/26; the wound on the right lateral foot is almost closed over but he still complains of a lot of pain in this area. He has a eschared area on the right second toe but no open wound.. Also concerning today his original wound on the left medial malleolus looks healthy however just inferior to this almost areas of deep tissue injury and there is an area on the left lateral foot also looks like a deep tissue injury. In the past I have wondered whether he might have cholesterol emboli or some other form of a vasculopathy. I wonder whether this could have something to do with this. 6/2; right lateral foot wound is still open I gave him empiric antibiotics last week without really a lot of change she is still complaining of pain in this area. We x- rayed his feet at some point in April these were negative. It would be difficult to do an MRI. The area on his left medial malleolus looks better. Right second toe is healed. He still has what looks to be a small pressure related area on the left lateral foot but it is not open. We are using PolyMem to all wound which seems to be doing nicely 6/16; right lateral foot wound is still open. The wound looks better although there is still a lot of tenderness around it. Rather than more empiric antibiotics I have cultured this today. We are using PolyMem Ag. On the left lateral malleolus the wound appears better. He has a new area on the left lower anterior pretibial area 6/29; 2-week follow-up. Culture I did of the right lateral foot 2 weeks ago showed MRSA. I gave him 2 weeks of Septra which he should be just about finishing now and this seems better. We got a call from home health last week to report swelling in the left leg because we were listed as his primary. We referred him back to primary care. He arrives today with intense erythema and tenderness around the 2 wounds on the left medial ankle on the left lower calf. The area on the right  lateral foot looks better 7/7; I brought him back this week to follow-up on his left foot which had cellulitis last week. I gave him clindamycin which as far as I know he tolerated. The erythema is better. He has a 3 current wounds one on the right lateral foot a small area on the right lateral malleolus and the more recent area on the left anterior pretibial area. 7/21; bilateral distal lower leg and foot wounds. The area on his right lateral foot looks a lot better. He has an area on his left pretibial area distally. The area on the medial malleolus on the left which is 1 of his worst wounds has closed over. He does not appear to have any open areas in his remaining toes 8/4-Patient returns at 2 weeks, he has the remaining wound on  the left lower leg the other 2 wounds have healed. Patient is continued to be treated with silver alginate with a 2 layer wrap on the left 8/18; I been following this patient for a long period for bilateral lower extremity wounds. 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 His wounds are probably pressure although he religiously wears thick bunny boots. He does not have macrovascular disease but I did formal testing on him perhaps 2 or 3 years ago. I have often wondered about either cholesterol emboli or a vasculopathy of some sort given the distal nature of these variable locations etc. He has 2 areas that we have been following one on the left medial lower leg and one on the right lateral foot. Both of these are mostly healed today there is some eschar over sections of both areas although I did not disturb this. 03/31/2019 on evaluation today patient appears to be doing a little bit more poorly in regard to the sacral region. He has 2 small wounds noted at this point. Fortunately there is no signs of active infection at this time. No fevers, chills, nausea, vomiting, or diarrhea. He is having some pain when he sits on  his gluteal region a big part of the reason he has these wounds is likely due to the fact that his wheelchair has not been functioning properly. He does still have a couple areas of deep tissue injury on his lower extremities he does need new Prevalon offloading boots as well. 04/28/2019 on evaluation today patient appears to be doing well with regard to his wounds in particular. His ankle area has fairly small based on what I am seeing currently and his sacral region actually is still open but does not appear to be doing too poorly in my opinion. Fortunately there is no signs of active infection at this time. He was in the hospital unfortunately and this was from 04/08/2019 through 04/12/2019 secondary to persistent diarrhea, dehydration and acute kidney injury. With that being said fortunately the patient does not seem to be doing too poorly at this time which is good news. He is having pain mainly in the sacral region. 06/23/2019 on evaluation today patient appears to be doing well with regard to his sacral region which in fact is completely healed. Unfortunately he has an area of rubbing/pressure on his right lateral chest/back region. This is where it is rubbing up on his chair. Subsequently he also has an open wound on the left medial malleolus which is not very open but starting to crack and then on the right lateral foot this is much more open at this point. Unfortunately there is no signs of significant improvement in regard to these areas and in fact there does appear to be evidence of active infection quite significantly. No fevers, chills, nausea, vomiting, or diarrhea. The patient did test positive for COVID-19 that has been greater than 14 days ago although they did not know the exact timing. That is why he was not here for his last appointment. 07/07/2019 upon evaluation today patient actually appears to be doing quite well in regard to his left ankle as well as the chest/back region which  is doing great in fact appears to be healed in my opinion. He still has an issue on his lateral right foot that is open and appears to be infected but does have me more concerned. In fact I do believe the doxycycline helped in general for the  cellulitis but I believe that may have been more MRSA related based on the culture that we did. What I am seeing right now has more of the blue-green drainage and may be more consistent with a Pseudomonas that I was hoping was not really causing much of an issue but I think it may be at this point on the right lateral foot. For that reason I do think treatment would be a good idea of the reason I avoided the Cipro before was the potential for QT prolongation which could obviously cause problems with the patient with results of interaction with respiratory wound. The tizanidine also had some interactions as well. Nonetheless I think at this point that it would be a possibility for [place] to use topical gentamicin to see if this could be of benefit for the patient underneath the silver alginate dressing. 07/21/2019 upon evaluation today patient appears to be doing fairly well at this point in regard to his lower extremity on the right. He does have a wound on his right lateral foot as well as the right fifth toe and the right fourth toe. Unfortunately he continues to experience significant issues with pressure and trauma to some degree and I think this is just due to the fact that again he is not really not able to control his legs and what is happening here. Fortunately there is no signs of active infection at this time. 08/04/2019 upon evaluation today patient actually appears to be doing quite well with regard to his toe ulcers I am very pleased in that regard. Unfortunately the foot ulcer is showing some signs of erythema his primary care provider has placed him on clindamycin at this point. Again I do believe this can be beneficial for him. With that being said I do  think the patient may benefit from a light Kerlix and Coban wrap to help with some of his edema at this point. 08/18/2019 upon evaluation today patient appears to be doing still poorly in regard to his lateral foot ulcer. This is not very deep but there is some concern about the possibility of osteomyelitis. I think it would be appropriate for [place] to go ahead and have an x-ray at this time. The patient is currently on clindamycin which from his culture which was performed on 06/23/2019 it does appear that he would benefit from that in regard to the MRSA. However Pseudomonas was also identified then and the patient actually has some blue-green drainage at this point as well. I am much more concerned about the possibility of the Pseudomonas being an infection is causative agent at this point that I am the MRSA. Subsequently Cipro the patient was sensitive to but we never use that is stable use a topical gent due to the fact that unfortunately there was some interaction between the Cipro and the patient's risk for down potentially causing QT prolongation. 08/31/2019; have not seen this patient in quite a period of time. He has a substantial wound on his right lateral foot we have been using gentamicin ointment with Hydrofera Blue over the top. From what I am able to determine he is also on antibiotics perhaps clindamycin although I have not had a chance to look over this. He also has an eschared area on the right fifth toe as well as the right second toe. 4/20; right lateral foot wound. He comes in today with intense erythema and tenderness around this wound. The wound itself has a surface on it but it doesn't  look particularly healthy a lot of gelatinous debris. There is no palpable bone. We've been using gentamicin ointment and Hydrofera Blue. Since the patient was last here he was seen by Dr. Algis Liming of infectious disease. He ordered an MRI that I think is due later this month on 4/30. He apparently also  checked a sedimentation rate and C-reactive protein metabolic panel CBC with differential. At the time he was seen he wanted to withhold any microbials pending identification of the depth of the infection although looking at this I don't know that I'm going to be able to hold off for another 10 days. 4/29; he went on to have the MRI that was ordered by Dr. Algis Liming of infectious disease. 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 I had given him cefdinir last week for surrounding cellulitis he is finishing this today. I have communicated with Dr. Algis Liming who wants bone for pathology and culture before proceeding with consideration of IV antibiotics. Fortunately he is due to see Dr. Samuella Cota of podiatry today I am hopeful that the bone biopsy and culture will be considered there 5/4 as I understand things Dr. Samuella Cota actually wants to remove the diseased proximal fifth metatarsal. Looking back at this decision is probably not a bad one. He has good blood flow he is nonambulatory they are apparently going to do this next week. Paradoxically the wound on the lateral part of his foot is actually a lot better. The cellulitis that was so problematic 2 weeks ago also has resolved. READMISSION 03/23/2020 T is now a 61 year old man he has cerebral palsy and is functionally paraplegic. The last time I saw him he had a diseased proximal fifth metatarsal. 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 Samuella Cota. Culture of this showed Pseudomonas and he was followed by infectious disease Dr. Algis Liming treated with ciprofloxacin. 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 He had Integra placed. A CandS showed Pseudomonas again. Since then he has had Silvadene as a primary dressing, then Santyl and more recently Xeroform and then back to Santyl 3 times a  week per his attendant who comes with him. Reviewing his records I was really not prepared for the number of wounds that he currently has. This includes substantial areas on the right lateral foot, right lateral ankle, right fourth toe dorsally, right ankle anteriorly as well as the left lateral ankle and foot. His medical history is unchanged ABIs 1.2 on the right and 1.21 on the left he has not been known to have arterial insufficiency and is not a diabetic 11/5; patient I readmitted to the clinic last week. He has multiple wounds on the right lateral foot, right fourth toe and a new area on the right second toe. He has areas on the right dorsal ankle and a substantive wound over the left lateral ankle and foot. We are using silver collagen. We put him under compression last week and this seems to have helped. He has home health coming out one other time per week to change the dressing 11/12; no real improvement. Multiple bilateral foot and ankle wounds. On the left he has a substantial wound on the left lateral ankle On the right he has wounds on the dorsal second and fourth toes. Right lateral foot we have merged to wounds these are necrotic She  has an area on the right dorsal ankle and a area on the right medial malleolus with marked surrounding erythema likely cellulitis. The patient complains about pain that kept him awake all last night from wraps that were too tight. He is not known to have arterial insufficiency. 11/18; no real change. I gave him empiric Levaquin last week directed at previously cultured Pseudomonas this is not really helped. On the right he has wounds on the right lateral foot right lateral ankle right medial ankle and the dorsal second and fourth toes. Most of these do not have a nice have a healthy surface. There is erythema in his foot which may all be venous stasis. The area on the left lateral malleolus/ankle area looks better 12/3; really disappointing if anything  deterioration especially on the right lateral foot. These are necrotic. He also has areas on the right dorsal ankle right medial malleolus and the right second and fourth toes. On the left he has a large area over the left medial ankle. There is some erythema around the wound. Things are very tender I have reviewed his most recent arterial Dopplers which are actually in 2019 but he has never been felt to have arterial issues. At that time his ABI on the right was 1.16 [no TBI due to amputation] on the left his ABI was 1.20 with a great toe pressure of 0.24. Waveforms on the right and left toes were all normal he was not felt to have significant evidence of lower extremity arterial disease. ABIs in our clinic have always been normal his pulses have been palpable. He had an MRI of his right foot in April 2021 this showed a soft tissue ulceration along the lateral midfoot with underlying osteomyelitis no abscess. 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 He returned to our clinic on 03/23/2020. The same pattern of wounds that I am describing currently this was a lot worse than earlier in the year. I gave him empiric antibiotics because he had had Pseudomonas in the past this does not seem to have helped. We have not made any progress on any of these wound site. The patient is in a lot of pain 12/10; the patient comes in with a new deep tissue injury on the right heel large necrotic wound on the right lateral foot, smaller necrotic wound on the dorsal ankle and medial ankle. Necrotic wounds on the second and fourth toes. We have been using Hydrofera Blue On the left he has the area on the left little medial ankle which actually looks some better healthier looking tissue. We are using Hydrofera Blue here as well X-ray ordered last week showed no osseous erosion no plain film indication of osteomyelitis. 12/17; PCR culture I did of the right lateral foot wound last week showed a large titer of  Enterococcus faecalis and low titers of of coag negative staph and Peptostreptococcus. I have started him on Augmentin for 2 weeks but I may continue beyond this. He has continuous complaints of pain now apparently making it difficult for him to rest at night. Substantial wound area in the right foot and ankle. Unfortunately the left medial foot seems to be more substantial this week. 12/27; in general the wounds on the right foot and ankle and the left medial ankle look better. There is certainly less erythema in the right foot. I have him on Augmentin and I am going to extend this for another week. MRI is booked for 06/01/2020 06/02/2020; patient's  extensive skin breakdown on the right foot continues it is stable to perhaps slightly improved however there is a considerable amount of tissue breakdown. Unfortunately his MRI shows findings consistent with osteomyelitis in the lateral aspect of the cuboid. The overlying wound in this area is deep but less necrotic than I am used to seeing. He has area on the right posterior heel is now necrotic.. The area on the left lateral ankle/foot looks somewhat better. The patient states he continues to be in a lot of pain from the right foot and he is really tired of hurting from these wounds. I have discussed things with T before. He is anxious to be out of pain. He is nonambulatory. Furthermore the osteomyelitis in the right foot and the erry extensive wounds in this area are only a source of potential systemic infection. He seems in favor of proceeding with a below-knee amputation. I cannot argue with this we have not made a lot of progress in several months working on these wounds ever since his fifth ray amputation by Dr. Samuella Cota 1/28; in general the patient's right foot looks better although there is several wounds still present there were certainly look less angry. The area on the right lateral foot looks better he still has a necrotic area on the right Achilles  heel but this is separating the right fourth toe and the right medial malleolus. We have been using silver alginate with Bactroban on all of these wounds. On the left medial ankle the wound looks healthy we have been using Hydrofera Blue. He has an appointment I think on Monday with Dr. Victorino Dike with regards to a BKA. Although the wounds on the right foot look better than they did heat the patient still relates that he has occasional pain and he suffered with this through the years he would rather have a below-knee amputation. Provided Dr. Victorino Dike agrees with this he is anxious to proceed 2/11; the patient continues to make decent progress with the area on the left medial ankle. We are using Hydrofera Blue in this area. Although retreating the right foot wounds palliatively he continues to make improvement in the right lateral dorsal foot, right heel and the wounds on his toes. He saw Dr. Victorino Dike to also concurred with the amputation directed has underlying osteomyelitis. The patient has suffered a great deal with the pain in this foot from recurrent wounds although the wounds currently are making progress I do not think this decision is the wrong one. He is nonambulatory and has underlying osteomyelitis Electronic Signature(s) Signed: 07/07/2020 5:36:32 PM By: Baltazar Najjar MD Entered By: Baltazar Najjar on 07/07/2020 17:34:09 -------------------------------------------------------------------------------- Physical Exam Details Patient Name: Date of Service: [name], [name] 07/07/2020 10:45 A M Medical Record Number: [id number] Patient Account Number: [id number] Date of Birth/Sex: Treating RN: Sep 13, 1960 (60 y.o. [name] Primary Care Provider: [name] Other Clinician: Referring Provider: Treating Provider/Extender: [name], [name] in Treatment: 15 Constitutional Patient is hypertensive.. Pulse regular and within target range for patient.Marland Kitchen Respirations  regular, non-labored and within target range.. Temperature is normal and within the target range for the patient.Marland Kitchen Appears in no distress. Notes Sam The left foot is much smaller on the medial ankle. He still has areas on the right medial malleolus, right heel, right lateral foot and areas on his toes all of these look better to we have been using silver alginate Electronic Signature(s) Signed: 07/07/2020 5:36:32 PM By: Baltazar Najjar MD Entered By: Baltazar Najjar on 07/07/2020  17:31:46 -------------------------------------------------------------------------------- Physician Orders Details Patient Name: Date of Service: JAZEN, SPRAGGINS 07/07/2020 10:45 A M Medical Record Number: [id number] Patient Account Number: [id number] Date of Birth/Sex: Treating RN: 10/28/1960 (60 y.o. 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 Bathing/ Shower/ Hygiene May shower with protection but do not get wound dressing(s) wet. Other Bathing/Shower/Hygiene Orders/Instructions: - scrub legs gently with wash cloth and soap and water with dressing changes Edema Control - Lymphedema / SCD / Other Bilateral Lower Extremities Elevate legs to the level of the heart or above for 30 minutes daily and/or when  sitting, a frequency of: Off-Loading Heel suspension boot to: - heel protectors at all times Other: - avoid any pressure to back of heels Home Health No change in wound care orders this week; continue Home Health for wound care. May utilize formulary equivalent dressing for wound treatment orders unless otherwise specified. Other Home Health Orders/Instructions: - [facility] Wound Treatment Wound #41 - T Fourth oe Wound Laterality: Right Cleanser: Soap and Water (Home Health) 2 x Per Week/30 Days Discharge Instructions: May shower and wash wound with dial antibacterial soap and water prior to dressing change. Cleanser: Wound Cleanser (Home Health) 2 x Per Week/30 Days Discharge Instructions: Cleanse the wound with wound cleanser prior to applying a clean dressing using gauze sponges, not tissue or cotton balls. Peri-Wound Care: Sween Lotion (Moisturizing lotion) 2 x Per Week/30 Days Discharge Instructions: Apply moisturizing lotion with dressing changes Topical: Bactroban (Home Health) 2 x Per Week/30 Days Discharge Instructions: apply to wound bed under alginate Prim Dressing: KerraCel Ag Gelling Fiber Dressing, 4x5 in (silver alginate) (Home Health) 2 x Per Week/30 Days ary Discharge Instructions: Apply silver alginate to wound bed as instructed Secondary Dressing: Woven Gauze Sponge, Non-Sterile 4x4 in (Home Health) 2 x Per Week/30 Days Discharge Instructions: Apply over primary dressing as directed. Secondary Dressing: ABD Pad, 5x9 (Home Health) 2 x Per Week/30 Days Discharge Instructions: Apply over primary dressing as directed. Compression Wrap: Kerlix Roll 4.5x3.1 (in/yd) (Home Health) 2 x Per Week/30 Days Discharge Instructions: Apply Kerlix and Coban compression as directed. Compression Wrap: Coban Self-Adherent Wrap 4x5 (in/yd) (Home Health) 2 x Per Week/30 Days Discharge Instructions: Apply over Kerlix as directed. Wound #43 - Lower Leg Wound Laterality: Right, Anterior,  Distal Cleanser: Soap and Water (Home Health) 2 x Per Week/30 Days Discharge Instructions: May shower and wash wound with dial antibacterial soap and water prior to dressing change. Cleanser: Wound Cleanser (Home Health) 2 x Per Week/30 Days Discharge Instructions: Cleanse the wound with wound cleanser prior to applying a clean dressing using gauze sponges, not tissue or cotton balls. Peri-Wound Care: Sween Lotion (Moisturizing lotion) 2 x Per Week/30 Days Discharge Instructions: Apply moisturizing lotion with dressing changes Topical: Bactroban (Home Health) 2 x Per Week/30 Days Discharge Instructions: apply to wound bed under alginate Prim Dressing: KerraCel Ag Gelling Fiber Dressing, 4x5 in (silver alginate) (Home Health) 2 x Per Week/30 Days ary  Discharge Instructions: Apply silver alginate to wound bed as instructed Secondary Dressing: Woven Gauze Sponge, Non-Sterile 4x4 in (Home Health) 2 x Per Week/30 Days Discharge Instructions: Apply over primary dressing as directed. Secondary Dressing: ABD Pad, 5x9 (Home Health) 2 x Per Week/30 Days Discharge Instructions: Apply over primary dressing as directed. Compression Wrap: Kerlix Roll 4.5x3.1 (in/yd) (Home Health) 2 x Per Week/30 Days Discharge Instructions: Apply Kerlix and Coban compression as directed. Compression Wrap: Coban Self-Adherent Wrap 4x5 (in/yd) (Home Health) 2 x Per Week/30 Days Discharge Instructions: Apply over Kerlix as directed. Wound #44 - Foot Wound Laterality: Right, Lateral, Proximal Cleanser: Soap and Water (Home Health) 2 x Per Week/30 Days Discharge Instructions: May shower and wash wound with dial antibacterial soap and water prior to dressing change. Cleanser: Wound Cleanser (Home Health) 2 x Per Week/30 Days Discharge Instructions: Cleanse the wound with wound cleanser prior to applying a clean dressing using gauze sponges, not tissue or cotton balls. Peri-Wound Care: Sween Lotion (Moisturizing lotion) 2 x Per  Week/30 Days Discharge Instructions: Apply moisturizing lotion with dressing changes Topical: Bactroban (Home Health) 2 x Per Week/30 Days Discharge Instructions: apply to wound bed under alginate Prim Dressing: KerraCel Ag Gelling Fiber Dressing, 4x5 in (silver alginate) (Home Health) 2 x Per Week/30 Days ary Discharge Instructions: Apply silver alginate to wound bed as instructed Secondary Dressing: Woven Gauze Sponge, Non-Sterile 4x4 in (Home Health) 2 x Per Week/30 Days Discharge Instructions: Apply over primary dressing as directed. Secondary Dressing: ABD Pad, 5x9 (Home Health) 2 x Per Week/30 Days Discharge Instructions: Apply over primary dressing as directed. Compression Wrap: Kerlix Roll 4.5x3.1 (in/yd) (Home Health) 2 x Per Week/30 Days Discharge Instructions: Apply Kerlix and Coban compression as directed. Compression Wrap: Coban Self-Adherent Wrap 4x5 (in/yd) (Home Health) 2 x Per Week/30 Days Discharge Instructions: Apply over Kerlix as directed. Wound #47 - Malleolus Wound Laterality: Left, Medial Cleanser: Soap and Water (Home Health) 2 x Per Week/30 Days Discharge Instructions: May shower and wash wound with dial antibacterial soap and water prior to dressing change. Cleanser: Wound Cleanser (Home Health) 2 x Per Week/30 Days Discharge Instructions: Cleanse the wound with wound cleanser prior to applying a clean dressing using gauze sponges, not tissue or cotton balls. Peri-Wound Care: Sween Lotion (Moisturizing lotion) (Home Health) 2 x Per Week/30 Days Discharge Instructions: Apply moisturizing lotion with dressing changes Prim Dressing: Hydrofera Blue Classic Foam, 4x4 in (Home Health) 2 x Per Week/30 Days ary Discharge Instructions: Apply to wound bed as instructed Secondary Dressing: Woven Gauze Sponge, Non-Sterile 4x4 in 2 x Per Week/30 Days Discharge Instructions: Apply over primary dressing as directed. Compression Wrap: Kerlix Roll 4.5x3.1 (in/yd) (Home Health) 2  x Per Week/30 Days Discharge Instructions: Apply Kerlix and Coban compression , be sure to wrap from base of toes to tibial tuberosity. no too tight Compression Wrap: Coban Self-Adherent Wrap 4x5 (in/yd) (Home Health) 2 x Per Week/30 Days Discharge Instructions: Apply over Kerlix as directed. Wound #48 - Calcaneus Wound Laterality: Right Cleanser: Soap and Water (Home Health) 2 x Per Week/30 Days Discharge Instructions: May shower and wash wound with dial antibacterial soap and water prior to dressing change. Cleanser: Wound Cleanser (Home Health) 2 x Per Week/30 Days Discharge Instructions: Cleanse the wound with wound cleanser prior to applying a clean dressing using gauze sponges, not tissue or cotton balls. Peri-Wound Care: Sween Lotion (Moisturizing lotion) 2 x Per Week/30 Days Discharge Instructions: Apply moisturizing lotion with dressing changes Topical: Bactroban (Home Health) 2 x  Per Week/30 Days Discharge Instructions: apply to wound bed under alginate Prim Dressing: KerraCel Ag Gelling Fiber Dressing, 4x5 in (silver alginate) (Home Health) 2 x Per Week/30 Days ary Discharge Instructions: Apply silver alginate to wound bed as instructed Secondary Dressing: Woven Gauze Sponge, Non-Sterile 4x4 in (Home Health) 2 x Per Week/30 Days Discharge Instructions: Apply over primary dressing as directed. Secondary Dressing: ABD Pad, 5x9 (Home Health) 2 x Per Week/30 Days Discharge Instructions: Apply over primary dressing as directed. Secondary Dressing: ALLEVYN Heel 4 1/2in x 5 1/2in / 10.5cm x 13.5cm (Home Health) 2 x Per Week/30 Days Discharge Instructions: Apply over primary dressing as directed. Compression Wrap: Kerlix Roll 4.5x3.1 (in/yd) (Home Health) 2 x Per Week/30 Days Discharge Instructions: Apply Kerlix and Coban compression as directed. Compression Wrap: Coban Self-Adherent Wrap 4x5 (in/yd) (Home Health) 2 x Per Week/30 Days Discharge Instructions: Apply over Kerlix as  directed. Electronic Signature(s) Signed: 07/07/2020 5:36:32 PM By: Baltazar Najjar MD Signed: 07/07/2020 5:49:38 PM By: Zenaida Deed RN, BSN Entered By: Zenaida Deed on 07/07/2020 11:48:40 -------------------------------------------------------------------------------- Problem List Details Patient Name: Date of Service: [name], [name] 07/07/2020 10:45 A M Medical Record Number: [id number] Patient Account Number: [id number] Date of Birth/Sex: Treating RN: Dec 28, 1960 (59 y.o. [name] Primary Care Provider: [name] Other Clinician: Referring Provider: Treating Provider/Extender: [name], [name] in Treatment: 15 Active Problems ICD-10 Encounter Code Description Active Date MDM Diagnosis T81.31XD Disruption of external operation (surgical) wound, not elsewhere classified, 03/23/2020 No Yes subsequent encounter S90.811D Abrasion, right foot, subsequent encounter 03/23/2020 No Yes L97.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 layer exposed 03/23/2020 No Yes L97.312 Non-pressure chronic ulcer of right ankle with fat layer exposed 03/23/2020 No Yes L97.322 Non-pressure chronic ulcer of left ankle with fat layer exposed 03/23/2020 No Yes L89.616 Pressure-induced deep tissue damage of right heel 05/05/2020 No Yes Inactive Problems ICD-10 Code Description Active Date Inactive Date I89.0 Lymphedema, not elsewhere classified 03/23/2020 03/23/2020 L03.115 Cellulitis of right lower limb 04/07/2020 04/07/2020 Resolved Problems Electronic Signature(s) Signed: 07/07/2020 5:36:32 PM By: Baltazar Najjar MD Entered By: Baltazar Najjar on 07/07/2020 17:29:39 -------------------------------------------------------------------------------- Progress Note Details Patient Name: Date of Service: [name] 07/07/2020 10:45 A M Medical Record Number: [id number] Patient Account Number: [id number] Date of Birth/Sex: Treating  RN: [birth year] (60 y.o. [name] Primary Care Provider: [name] Other Clinician: Referring Provider: Treating Provider/Extender: [name], [name] in Treatment: 15 Subjective History of Present Illness (HPI) 11/23/15; this is a patient I remember from a previous stay in this clinic in 2012. The patient says this was for a wound in this same area as currently although I have no information to verify that. Most of the information is provided by his caregiver from the group home where he resides. Apparently he has had a wound in this area for 8 months. He also has erythema around this area and has had multiple rounds of antibiotics apparently with some improvement but the erythema is persists. He apparently had a fracture in the ankle 2 months ago and was seen by Dr. Lajoyce Corners . He apparently had a splint applied and more recently a heel offloading boot. He had home health coming out to a week ago not clear what they are dressing this with but they apparently discharged him perhaps because of one of Dr. 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 He has a Nurse, adult for transferring at home he  is nonambulatory and does not wear footwear. He is not a diabetic. Looking through [facility] he appears to have fairly active primary care. The area on the ankle is variably labeled as a pressure area and/or chronic venous insufficiency. He had a recent venous ultrasound on 5/25 that did not show a DVT in the left leg however this was not a reflux study. There was no superficial DVT either. Arterial studies on 10/15/14 showed a left ABI of 1.16 Center right of 1.12 waveforms were triphasic he does not have significant arterial disease. He has had recent x-rays of the left foot and ankle. The left foot did not show any abnormality. Left ankle x-ray showed a spiral fracture of the left tibial diaphysis without evidence of  osteomyelitis. 11/30/15 culture I did last week showed pseudomonas I changed him to oral ciprofloxacin. Erythema is a lot better. 12/07/15 area around the wound shows considerable improvement of the periwound erythema. 12/14/15; the patient has a improvement of the periwound erythema which in retrospect I think with cellulitis successfully treated. We have been using Iodoflex started last week 12/25/15 wound on the left medial malleolus and dorsal left foot. Theraskin #1 applied READMISSION 02/27/16; the patient was admitted to hospital from 8/14 through 01/18/16 initially felt to have cellulitis of his left lower leg however he was noted to have significant persistent pain and swelling. A duplex ultrasound was ordered that was negative. Obie Dredge an x-ray was done that showed a spiral fracture of the left leg. With posterior displacement and angulation at the mid left tibia. Orthopedics saw the patient and splinted the leg. He was then sent to [facility] skilled facility and only return to his group home last week. He has wounds on the left medial malleolus, left dorsal foot and a worrisome area on the plantar left heel which is not really open but may represent a hematoma or a DTI 03/06/16; areas of left anterior and medial ankle both look improved. There are 3 small wounds here. He has a area over the dorsal left first metatarsal head which is not open. In meantime his left heel has opened 03/14/16 the medial left ankle wounds looks stable to improved. The area on the posterior left heel has opened up and had a very adherent eschar and slept over this 03/21/16 patient has 3 wounds on his left medial ankle is stable area on his left dorsal first metatarsal head appears to be improved. Continued problems with adherent eschar over the left heel 03/28/16; patient has 3 wounds on his left medial ankle and lower leg all of these appear to be stable. The problem continues to be the left heel and the  adherent eschar. This may have been a pressure ulcer from her brace at one point. He has a English as a second language teacher we have been using [employer] and all wound areas. Dressings are being changed by home health 04/04/16; the patient's areas on the medial aspect of the left ankle looks superficial and appear to be gradually closing. The area on the plantar aspects/Achilles aspect of his left heel as surface slough and nonviable subcutaneous tissue requiring debridement. 04/11/16; the areas on the patient's medial left ankle appear to be superficial and appear to be closing. The area on the plantar heel on the left has surface slough and nonviable subcutaneous tissue requiring debridement. I don't think this is too much different from last week there is also undermining 04/25/16; patient is followed in his group home by [facility] home health.  The area on the plantar heel is smaller but with still with some depth. We're using Iodoflex to this. The areas on the left ankle looks healthy but dry and somewhat larger. Cause of this is not really clear using Prisma 05/02/16; areas x3 all look improved expecially the areas on the medial ankle. Heel wound is smaller but still deep 05/09/16; all areas o3 look improved on the medial ankle. Heel wound is also contracting. 12/0.9/17 the areas on his left medial ankle are all closed. I think these are largely chronic venous insufficiency however there is no edema. The area on his left heel was a pressure area. READMISSION 08/02/16; this man is a patient that we have had in this clinic on several times before. I believe he has cerebral palsy is nonambulatory. He has chronic venous insufficiency with venous inflammation. He was last year in the fall of 2017 with areas on his left medial ankle left heel and left dorsal foot. He arrives today with once again a vague history. As mentioned he is nonambulatory he spends most of his day in a pressure offloading boot nevertheless they  noted wounds in this area on the medial left ankle 2 weeks ago. There is any other history here I am uncertain 08/15/16; the patient's wounds on his medial ankle actually look better however the area on the left lateral/dorsal foot is covered in black necrotic eschar. Previous formal arterial studies did not show significant arterial disease with normal ABIs and triphasic waveforms. He is immobilized and at risk for pressure although he seems aware protective boots at all times as far as I am aware. 08/22/16; in general his wounds look better. All the wounds on his medial left ankle look improved the area on his lateral foot however continues to be problematic. Patient comes in the clinic today in a new wheelchair he is quite delighted 09/05/16; a wound on the left medial Calf and left dorsal foot are healed. The area on the left lateral foot is improved however the area on the left medial malleolus is inflamed with a considerable degree of surrounding erythema. This looks like cellulitis. ABIs and clinical exam are within normal limits 09/12/16; the patient continues to have a wound on the left medial calf. I was concerned about cellulitis in this area last week and gave him a course of doxycycline. The wound looks better here. The area on the left lateral foot however is unchanged 09/20/16; the patient came in this week early after traumatizing his toe on the left foot on a table while moving around in his motorized wheelchair. 09/26/16- he is here for follow-up evaluation of his left foot and malleolus ulcers. Since his appointment he abraded his left medial hallux against a table while in his motorized wheelchair, apparently trying to pull up to the table for a meal. He continues to complain of tenderness to the left lateral foot, surrounding the current ulcer 10/07/16; left foot and malleolus ulcers. 10/14/16; the patient is doing well. He has a wound on the left lateral, left medial malleolus and  atraumatic wound on the left great toe. Although these appear to be superficial and closing 10/31/16; the patient is doing well. The area on the left lateral foot, left medial malleolus and the left great toe or all epithelialized. He has home health 11/14/16; patient arrives today with the area on the left great toe healed. The left lateral foot however in the left medial malleolus and the ankle superficial wounds that are  slightly larger. He has erythema and tenderness around the area on the left lateral foot. As well he is complaining of dysuria. His caregiver notes that when he gets like this he is more lethargic which indeed seems to be the case today 11/21/16; left great toe is still healed. His wound on the left lateral foot appears healthy and slightly smaller also the area on the left medial ankle. This had black eschar on it which I removed. The remaining wound looks satisfactory 12/12/16; patient hasn't been here in a number of weeks. The area on the left lateral foot is healed however on his medial ankle has 2 open areas one of them covered with a necrotic eschar. He also has a new wound on the right second toe which was apparently trauma induced while he was at a movie 12/27/16; the patient has wounds on his left medial mallelus and left lateral foot that are just about closed. The area on the right second toe which was trauma from last time is still open but again everything looks a lot better here. 01/30/17; patient arrives in clinic today and is really been a long time since I've seen him. The wounds are all epithelialized on the left foot this includes his left lateral foot, left medial malleolus. Apparently was in the ER for what of I think was a fungal infection on his buttock's he was prescribed Diflucan and doxycycline I think this is already getting better. READMISSION T is a now 60 year old man who has advanced cerebral palsy and functional quadriplegia. We have had him in this clinic  multiple times before for wounds erry on his right ankle, left ankle, left dorsal foot and most recently from 08/02/16 through 01/30/17 with his left medial ankle and left foot and finally right second toe. His attendant from the group home where he lives is very knowledgeable about him. She states that they noticed breakdown in his skin in his feet sometime in January although they could not get an appointment until today in this clinic. The previous wounds have always been felt to be secondary to incidental trauma/pressure areas or possibly some degree of chronic venous insufficiency. He had normal arterial studies in may of 2016. He has definite open wounds on the right lateral foot over the base of the fifth metatarsal, the right second toe DIP, the left medial malleolus. He has concerning areas over the left lateral fifth metatarsal base, retaform purpura over the medial left ankle and perhaps the medial left first toe. 07/21/17; culture we did last week from the base of the right fifth metatarsal grew group G strep. This should've been sensitive to the doxycycline I gave him. The other major wounds are on the right second toe DIP, left medial malleolus, base of the left lateral fifth metatarsal and the medial left ankle and medial left toe. 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 We have macrovascular arterial studies scheduled for Thursday although I don't think this is what the problem is. 07/28/17; patient arrives with really no improvement. He has a wound on the right second toe, lateral right foot, right medial malleolus which was new this week, left medial malleolus, but looked like a DTI on the left great toe. His macrovascular studies are on 07/30/17 although I would be surprised if this provides all the explanation. He has small areas even on his right second and third toe which look like splinter-type hemorrhages. The question I have is where is this coming from. I'd like  to  see the vascular studies however before we pursue this. He has had a DVT in the past but is no longer on anticoagulants. I'll need to review his history 08/04/17; the patient had his ARTERIAL STUDIES this showed a right ABI of 1.16 and the left ABI of 1.20. TBI on the left at 0.84. He is at a right first toe amputation. The studies were unchanged from studies in May 2016. It was not felt that he had significant lower extremity arterial disease In the meantime we are using silver alginate to the wounds on the right second toe right lateral foot left medial malleolus. I'm going to change to Silver collagen today. 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 Working him up for this would include an echocardiogram probably a CT scan of the chest and abdomen to look at the major thoracic and abdominal aortic vessels. I am not going to involve myself in this unless there is more symptoms/signs 08/18/17; the patient arrives today with wounds looking somewhat better. The remaining areas are the right second toe dorsal, right lateral foot and left medial malleolus which only has a small open area. We've been using silver collagen 09/01/17;right second toe dorsally probes to bone today. This is a deterioration. We've been using silver collagen. Only a small open area remains on the left medial malleolus. 09/09/17; right second toe appears better. There is no probing bone. He has a small area remaining on the left medial malleolus, the right lateral foot. We have been using silver collagen 10/07/17; the patient returns to clinic today after a month hiatus. He has wounds over the right second toe, right lateral foot and the left medial malleolus. We have been using silver collagen. He has home health. 10/24/17; patient has open wounds over the right second toe, now the left fourth toe and a  superficial area over his left buttock. Some of the other areas have healed including the left medial malleolus and right lateral foot. Using silver collagen on these wounds 11/07/17; the patient has closed his wounds on the right second toe and left fourth toe although they look all normal. Last time he was here he had a superficial area over the left buttock this is also closed. The right lateral foot wound I had closed out last time although it is seems to have reopened today. 628/19 the areas over the right second and left fourth toe remains closed. The right lateral foot has tightly adherent necrotic surface over. We have been using silver collagen he offloads this and a bunny boot 12/12/17; the patient has reopened the recurrent area over the right second PIP joint. 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 He does not have obvious macrovascular disease. The area over the right lateral foot looks about the same as last time tightly adherent necrotic debris. We've been using Hydrofera Blue 12/25/17; 2 week follow-up. The area over the right second PIP is closed however the area on the right lateral foot is worse with surrounding erythema and pain compatible with cellulitis is going to need an antibiotic. ooAS WELL..... We have looked at his buttocks and lower back. He has 3 stage II ulcers. The exact history here is unclear however he is very disabled man with cerebral palsy. He goes to a day program from the group home in  which she lives 5 days a week and I think he is on his buttock all day on these visits. Surrounding the wounds see has extensive stage I injury as well. ooFINALLY it looks as though he has lost weight. I'm not the only staff member that is recognized this 01/08/18 on evaluation today patient appears to be doing better in regard to his gluteal wounds. In fact everything appears to be healed back here although very dry. He  has a couple open sores on the scrotal region other than this it is really his foot that is still giving him the most trouble. He did complete the Keflex unfortunately it seems like there's still a lot of erythema in this in fact looks worse than it did previous. I'm gonna culture this today and likely start with a different antibiotic. 01/16/18; I was reassured that the wounds on his buttocks and sacrum are healed. I did not look at these the day. The culture that was done last week on 01/11/18 of the right lateral foot wounds showed Pseudomonas. Bactrim is not a good choice for this. Ciprofloxacin interacts with his muscle relaxants therefore I prescribed cefdinir 300 twice a day for 7 days. Bactrim coverage of pseudomonas is not reliable although the degree of erythema looks a lot better 01/30/18; the patient has completed his antibiotics. The area does not have infection on the right lateral foot. The wounds look a lot better. We've been using silver alginate 02/12/18; there is on the right lateral foot. Copious amounts of surface eschar. Also similarly over the PIP of the right second toe. We've been using silver alginate to date 03/02/2018; the area that remains is on the right lateral foot. The right second toe is healed. 03/16/2018; the area there is original wound on the right lateral foot. Still requiring debridement. We are using Hydrofera Blue. The right second toe is still healed/closed 03/30/2018; right lateral foot there is 2 small open areas remaining. They actually look quite healthy we have been using Hydrofera Blue the right second toe remains closed 04/13/2018; right lateral foot. 1 of the 2 small open areas from last time has closed over. The other had surface slough that I removed with a #3 curette. This looks quite healthy. The right second toe remains closed. We are using Hydrofera Blue 05/01/2018; right lateral foot very superficial areas that look like they are trying to close  over. He had a new threatened area on the left medial ankle just below the medial malleolus. I think these represent chronic venous insufficiency areas 05/14/2018; the right right lateral foot looks like it closed over as was predicted last time however he has some dark areas subcutaneously erythema superiorly and this is really very tender. This is either coexistent cellulitis or perhaps a deep tissue injury. The area on the left medial ankle is superficial but has some size. We have been using Hydrofera Blue to both areas. The patient complains of pain in his right foot 06/09/2018; right lateral foot was closed over last time but there was coexistent cellulitis around this. I gave him Levaquin. He also has an area on the left medial malleolus. He has not been back to clinic in over 3-1/2 weeks. We are using Hydrofera Blue I think to the left medial malleolus and silver alginate to the right lateral foot 1/21; right lateral foot still non-viable debris over this area and the left medial malleolus. Both of these vigorously washed off with antiseptic gauze. We have been using  silver alginate. 2/4; right lateral foot this looks somewhat better. Unfortunately the area over the left medial malleolus has deteriorated. Using silver alginate in both area 2/18; both wounds arrived today covered in a lot of necrotic debris. We have been using silver alginate. Clearly not making a lot of progress. 3/3; the patient's right second toe is reopened over the PIP. I am not sure why this is happening. He may have microvascular disease. We have checked his macrovascular flow in the past and found it to be normal. He may need an x-ray. The area on the right lateral foot requires debridement. The area on the left lateral ankle is worse. We have been using PolyMem Ag 08/26/18 on evaluation today patient appears to be doing a little worse in regard to his lower extremities based on what I'm seeing what the nursing staff is  telling me. His left medial ankle appears potential to be infected there is your thing on want surrounding the wound. His right second toe is also of concern at this point in my pinion. We have not done x-rays of these areas that may be a good idea to go ahead and proceed with at this point to ensure will not deal with anything such as osteomyelitis. 4/16; it is been a while since I saw this patient. He now has wounds on the right lateral foot, dorsal right second toe, substantially on the left medial malleolus and a small area on the left lateral foot. We have been using PolyMem Silver. Curlex and Coban. He has home health changing the dressing. Since last time he was here he had a culture of the left medial ankle that showed MRSA. He is completed a course/7 days of doxycycline. XRAYS of the left ankle and right foot that were ordered were negative for osteomyelitis 4/30; he arrives in clinic today with some maceration around the wound and a lot of tenderness and erythema in a circumferential area around the right lateral foot. He has wound areas on the right lateral foot dorsal right second toe, largely over the left medial malleolus and a small area over the left lateral foot 5/7; completed his antibiotics from last time right lateral foot cellulitis is better and the area on his left knee which we discovered after he had been seen by the provider also is improved. He still has extensive wound areas over the right lateral foot, left medial malleolus, right second toe and left lateral foot although all of these look somewhat better with PolyMem 5/15. Wounds on the right lateral foot, right second toe left medial malleolus and the left lateral foot. All of these look somewhat better. We have been using PolyMem Ag 5/26; the wound on the right lateral foot is almost closed over but he still complains of a lot of pain in this area. He has a eschared area on the right second toe but no open  wound.Geoffry Paradise concerning today his original wound on the left medial malleolus looks healthy however just inferior to this almost areas of deep tissue injury and there is an area on the left lateral foot also looks like a deep tissue injury. In the past I have wondered whether he might have cholesterol emboli or some other form of a vasculopathy. I wonder whether this could have something to do with this. 6/2; right lateral foot wound is still open I gave him empiric antibiotics last week without really a lot of change she is still complaining of pain in this  area. We x- rayed his feet at some point in April these were negative. It would be difficult to do an MRI. The area on his left medial malleolus looks better. Right second toe is healed. He still has what looks to be a small pressure related area on the left lateral foot but it is not open. We are using PolyMem to all wound which seems to be doing nicely 6/16; right lateral foot wound is still open. The wound looks better although there is still a lot of tenderness around it. Rather than more empiric antibiotics I have cultured this today. We are using PolyMem Ag. On the left lateral malleolus the wound appears better. He has a new area on the left lower anterior pretibial area 6/29; 2-week follow-up. Culture I did of the right lateral foot 2 weeks ago showed MRSA. I gave him 2 weeks of Septra which he should be just about finishing now and this seems better. We got a call from home health last week to report swelling in the left leg because we were listed as his primary. We referred him back to primary care. He arrives today with intense erythema and tenderness around the 2 wounds on the left medial ankle on the left lower calf. The area on the right lateral foot looks better 7/7; I brought him back this week to follow-up on his left foot which had cellulitis last week. I gave him clindamycin which as far as I know he tolerated. The erythema  is better. He has a 3 current wounds one on the right lateral foot a small area on the right lateral malleolus and the more recent area on the left anterior pretibial area. 7/21; bilateral distal lower leg and foot wounds. The area on his right lateral foot looks a lot better. He has an area on his left pretibial area distally. The area on the medial malleolus on the left which is 1 of his worst wounds has closed over. He does not appear to have any open areas in his remaining toes 8/4-Patient returns at 2 weeks, he has the remaining wound on the left lower leg the other 2 wounds have healed. Patient is continued to be treated with silver alginate with a 2 layer wrap on the left 8/18; I been following this patient for a long period for bilateral lower extremity wounds. 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 His wounds are probably pressure although he religiously wears thick bunny boots. He does not have macrovascular disease but I did formal testing on him perhaps 2 or 3 years ago. I have often wondered about either cholesterol emboli or a vasculopathy of some sort given the distal nature of these variable locations etc. He has 2 areas that we have been following one on the left medial lower leg and one on the right lateral foot. Both of these are mostly healed today there is some eschar over sections of both areas although I did not disturb this. 03/31/2019 on evaluation today patient appears to be doing a little bit more poorly in regard to the sacral region. He has 2 small wounds noted at this point. Fortunately there is no signs of active infection at this time. No fevers, chills, nausea, vomiting, or diarrhea. He is having some pain when he sits on his gluteal region a big part of the reason he has these wounds is likely due to the fact that his wheelchair has not been functioning  properly. He does still have a couple areas of deep tissue  injury on his lower extremities he does need new Prevalon offloading boots as well. 04/28/2019 on evaluation today patient appears to be doing well with regard to his wounds in particular. His ankle area has fairly small based on what I am seeing currently and his sacral region actually is still open but does not appear to be doing too poorly in my opinion. Fortunately there is no signs of active infection at this time. He was in the hospital unfortunately and this was from 04/08/2019 through 04/12/2019 secondary to persistent diarrhea, dehydration and acute kidney injury. With that being said fortunately the patient does not seem to be doing too poorly at this time which is good news. He is having pain mainly in the sacral region. 06/23/2019 on evaluation today patient appears to be doing well with regard to his sacral region which in fact is completely healed. Unfortunately he has an area of rubbing/pressure on his right lateral chest/back region. This is where it is rubbing up on his chair. Subsequently he also has an open wound on the left medial malleolus which is not very open but starting to crack and then on the right lateral foot this is much more open at this point. Unfortunately there is no signs of significant improvement in regard to these areas and in fact there does appear to be evidence of active infection quite significantly. No fevers, chills, nausea, vomiting, or diarrhea. The patient did test positive for COVID-19 that has been greater than 14 days ago although they did not know the exact timing. That is why he was not here for his last appointment. 07/07/2019 upon evaluation today patient actually appears to be doing quite well in regard to his left ankle as well as the chest/back region which is doing great in fact appears to be healed in my opinion. He still has an issue on his lateral right foot that is open and appears to be infected but does have me more concerned. In fact I do  believe the doxycycline helped in general for the cellulitis but I believe that may have been more MRSA related based on the culture that we did. What I am seeing right now has more of the blue-green drainage and may be more consistent with a Pseudomonas that I was hoping was not really causing much of an issue but I think it may be at this point on the right lateral foot. For that reason I do think treatment would be a good idea of the reason I avoided the Cipro before was the potential for QT prolongation which could obviously cause problems with the patient with results of interaction with respiratory wound. The tizanidine also had some interactions as well. Nonetheless I think at this point that it would be a possibility for [place] to use topical gentamicin to see if this could be of benefit for the patient underneath the silver alginate dressing. 07/21/2019 upon evaluation today patient appears to be doing fairly well at this point in regard to his lower extremity on the right. He does have a wound on his right lateral foot as well as the right fifth toe and the right fourth toe. Unfortunately he continues to experience significant issues with pressure and trauma to some degree and I think this is just due to the fact that again he is not really not able to control his legs and what is happening here. Fortunately there is no  signs of active infection at this time. 08/04/2019 upon evaluation today patient actually appears to be doing quite well with regard to his toe ulcers I am very pleased in that regard. Unfortunately the foot ulcer is showing some signs of erythema his primary care provider has placed him on clindamycin at this point. Again I do believe this can be beneficial for him. With that being said I do think the patient may benefit from a light Kerlix and Coban wrap to help with some of his edema at this point. 08/18/2019 upon evaluation today patient appears to be doing still poorly in regard  to his lateral foot ulcer. This is not very deep but there is some concern about the possibility of osteomyelitis. I think it would be appropriate for [place] to go ahead and have an x-ray at this time. The patient is currently on clindamycin which from his culture which was performed on 06/23/2019 it does appear that he would benefit from that in regard to the MRSA. However Pseudomonas was also identified then and the patient actually has some blue-green drainage at this point as well. I am much more concerned about the possibility of the Pseudomonas being an infection is causative agent at this point that I am the MRSA. Subsequently Cipro the patient was sensitive to but we never use that is stable use a topical gent due to the fact that unfortunately there was some interaction between the Cipro and the patient's risk for down potentially causing QT prolongation. 08/31/2019; have not seen this patient in quite a period of time. He has a substantial wound on his right lateral foot we have been using gentamicin ointment with Hydrofera Blue over the top. From what I am able to determine he is also on antibiotics perhaps clindamycin although I have not had a chance to look over this. He also has an eschared area on the right fifth toe as well as the right second toe. 4/20; right lateral foot wound. He comes in today with intense erythema and tenderness around this wound. The wound itself has a surface on it but it doesn't look particularly healthy a lot of gelatinous debris. There is no palpable bone. We've been using gentamicin ointment and Hydrofera Blue. Since the patient was last here he was seen by Dr. Algis Liming of infectious disease. He ordered an MRI that I think is due later this month on 4/30. He apparently also checked a sedimentation rate and C-reactive protein metabolic panel CBC with differential. At the time he was seen he wanted to withhold any microbials pending identification of the depth of the  infection although looking at this I don't know that I'm going to be able to hold off for another 10 days. 4/29; he went on to have the MRI that was ordered by Dr. Algis Liming of infectious disease. 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 I had given him cefdinir last week for surrounding cellulitis he is finishing this today. I have communicated with Dr. Algis Liming who wants bone for pathology and culture before proceeding with consideration of IV antibiotics. Fortunately he is due to see Dr. Samuella Cota of podiatry today I am hopeful that the bone biopsy and culture will be considered there 5/4 as I understand things Dr. Samuella Cota actually wants to remove the diseased proximal fifth metatarsal. Looking back at this decision is probably not a bad one. He has good blood flow he is nonambulatory they are apparently going to do this next  week. Paradoxically the wound on the lateral part of his foot is actually a lot better. The cellulitis that was so problematic 2 weeks ago also has resolved. READMISSION 03/23/2020 T is now a 60 year old man he has cerebral palsy and is functionally paraplegic. The last time I saw him he had a diseased proximal fifth metatarsal. 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 Samuella Cota. Culture of this showed Pseudomonas and he was followed by infectious disease Dr. Algis Liming treated with ciprofloxacin. 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 He had Integra placed. A CandS showed Pseudomonas again. Since then he has had Silvadene as a primary dressing, then Santyl and more recently Xeroform and then back to Santyl 3 times a week per his attendant who comes with him. Reviewing his records I was really not prepared for the number of wounds that he currently has. This includes substantial areas on the right lateral foot,  right lateral ankle, right fourth toe dorsally, right ankle anteriorly as well as the left lateral ankle and foot. His medical history is unchanged ABIs 1.2 on the right and 1.21 on the left he has not been known to have arterial insufficiency and is not a diabetic 11/5; patient I readmitted to the clinic last week. He has multiple wounds on the right lateral foot, right fourth toe and a new area on the right second toe. He has areas on the right dorsal ankle and a substantive wound over the left lateral ankle and foot. We are using silver collagen. We put him under compression last week and this seems to have helped. He has home health coming out one other time per week to change the dressing 11/12; no real improvement. Multiple bilateral foot and ankle wounds. ooOn the left he has a substantial wound on the left lateral ankle ooOn the right he has wounds on the dorsal second and fourth toes. ooRight lateral foot we have merged to wounds these are necrotic ooShe has an area on the right dorsal ankle and a area on the right medial malleolus with marked surrounding erythema likely cellulitis. The patient complains about pain that kept him awake all last night from wraps that were too tight. He is not known to have arterial insufficiency. 11/18; no real change. I gave him empiric Levaquin last week directed at previously cultured Pseudomonas this is not really helped. On the right he has wounds on the right lateral foot right lateral ankle right medial ankle and the dorsal second and fourth toes. Most of these do not have a nice have a healthy surface. There is erythema in his foot which may all be venous stasis. The area on the left lateral malleolus/ankle area looks better 12/3; really disappointing if anything deterioration especially on the right lateral foot. These are necrotic. He also has areas on the right dorsal ankle right medial malleolus and the right second and fourth toes. On the left  he has a large area over the left medial ankle. There is some erythema around the wound. Things are very tender I have reviewed his most recent arterial Dopplers which are actually in 2019 but he has never been felt to have arterial issues. At that time his ABI on the right was 1.16 [no TBI due to amputation] on the left his ABI was 1.20 with a great toe pressure of  0.24. Waveforms on the right and left toes were all normal he was not felt to have significant evidence of lower extremity arterial disease. ABIs in our clinic have always been normal his pulses have been palpable. He had an MRI of his right foot in April 2021 this showed a soft tissue ulceration along the lateral midfoot with underlying osteomyelitis no abscess. 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 He returned to our clinic on 03/23/2020. The same pattern of wounds that I am describing currently this was a lot worse than earlier in the year. I gave him empiric antibiotics because he had had Pseudomonas in the past this does not seem to have helped. We have not made any progress on any of these wound site. The patient is in a lot of pain 12/10; the patient comes in with a new deep tissue injury on the right heel large necrotic wound on the right lateral foot, smaller necrotic wound on the dorsal ankle and medial ankle. Necrotic wounds on the second and fourth toes. We have been using Hydrofera Blue On the left he has the area on the left little medial ankle which actually looks some better healthier looking tissue. We are using Hydrofera Blue here as well X-ray ordered last week showed no osseous erosion no plain film indication of osteomyelitis. 12/17; PCR culture I did of the right lateral foot wound last week showed a large titer of Enterococcus faecalis and low titers of of coag negative staph and Peptostreptococcus. I have started him on Augmentin for 2 weeks but I may continue beyond this. He has continuous complaints of  pain now apparently making it difficult for him to rest at night. Substantial wound area in the right foot and ankle. Unfortunately the left medial foot seems to be more substantial this week. 12/27; in general the wounds on the right foot and ankle and the left medial ankle look better. There is certainly less erythema in the right foot. I have him on Augmentin and I am going to extend this for another week. MRI is booked for 06/01/2020 06/02/2020; patient's extensive skin breakdown on the right foot continues it is stable to perhaps slightly improved however there is a considerable amount of tissue breakdown. Unfortunately his MRI shows findings consistent with osteomyelitis in the lateral aspect of the cuboid. The overlying wound in this area is deep but less necrotic than I am used to seeing. He has area on the right posterior heel is now necrotic.. The area on the left lateral ankle/foot looks somewhat better. The patient states he continues to be in a lot of pain from the right foot and he is really tired of hurting from these wounds. I have discussed things with T before. He is anxious to be out of pain. He is nonambulatory. Furthermore the osteomyelitis in the right foot and the erry extensive wounds in this area are only a source of potential systemic infection. He seems in favor of proceeding with a below-knee amputation. I cannot argue with this we have not made a lot of progress in several months working on these wounds ever since his fifth ray amputation by Dr. Samuella Cota 1/28; in general the patient's right foot looks better although there is several wounds still present there were certainly look less angry. The area on the right lateral foot looks better he still has a necrotic area on the right Achilles heel but this is separating the right fourth toe and the right medial  malleolus. We have been using silver alginate with Bactroban on all of these wounds. On the left medial ankle the wound  looks healthy we have been using Hydrofera Blue. He has an appointment I think on Monday with Dr. Victorino Dike with regards to a BKA. Although the wounds on the right foot look better than they did heat the patient still relates that he has occasional pain and he suffered with this through the years he would rather have a below-knee amputation. Provided Dr. Victorino Dike agrees with this he is anxious to proceed 2/11; the patient continues to make decent progress with the area on the left medial ankle. We are using Hydrofera Blue in this area. Although retreating the right foot wounds palliatively he continues to make improvement in the right lateral dorsal foot, right heel and the wounds on his toes. He saw Dr. Victorino Dike to also concurred with the amputation directed has underlying osteomyelitis. The patient has suffered a great deal with the pain in this foot from recurrent wounds although the wounds currently are making progress I do not think this decision is the wrong one. He is nonambulatory and has underlying osteomyelitis Objective Constitutional Patient is hypertensive.. Pulse regular and within target range for patient.Marland Kitchen Respirations regular, non-labored and within target range.. Temperature is normal and within the target range for the patient.Marland Kitchen Appears in no distress. Vitals Time Taken: 11:09 AM, Temperature: 97.4 F, Pulse: 92 bpm, Respiratory Rate: 17 breaths/min, Blood Pressure: 157/89 mmHg. General Notes: Sam ooThe left foot is much smaller on the medial ankle. ooHe still has areas on the right medial malleolus, right heel, right lateral foot and areas on his toes all of these look better to we have been using silver alginate Integumentary (Hair, Skin) Wound #41 status is Open. Original cause of wound was Trauma. The wound is located on the Right T Fourth. The wound measures 0.6cm length x 0.5cm oe width x 0.1cm depth; 0.236cm^2 area and 0.024cm^3 volume. There is Fat Layer (Subcutaneous  Tissue) exposed. There is no tunneling or undermining noted. There is a small amount of serosanguineous drainage noted. The wound margin is flat and intact. There is large (67-100%) red granulation within the wound bed. There is no necrotic tissue within the wound bed. Wound #43 status is Open. Original cause of wound was Pressure Injury. The wound is located on the Right,Distal,Anterior Lower Leg. The wound measures 0.1cm length x 0.1cm width x 0.1cm depth; 0.008cm^2 area and 0.001cm^3 volume. There is Fat Layer (Subcutaneous Tissue) exposed. There is no tunneling or undermining noted. There is a medium amount of serosanguineous drainage noted. The wound margin is flat and intact. There is large (67-100%) red granulation within the wound bed. There is no necrotic tissue within the wound bed. Wound #44 status is Open. Original cause of wound was Pressure Injury. The wound is located on the Right,Proximal,Lateral Foot. The wound measures 4cm length x 2.8cm width x 0.2cm depth; 8.796cm^2 area and 1.759cm^3 volume. There is tendon and Fat Layer (Subcutaneous Tissue) exposed. There is no tunneling or undermining noted. There is a medium amount of serosanguineous drainage noted. The wound margin is flat and intact. There is medium (34-66%) red, pink granulation within the wound bed. There is a medium (34-66%) amount of necrotic tissue within the wound bed including Adherent Slough. Wound #47 status is Open. Original cause of wound was Gradually Appeared. The wound is located on the Left,Medial Malleolus. The wound measures 1.3cm length x 2.1cm width x 0.1cm depth; 2.144cm^2 area  and 0.214cm^3 volume. There is Fat Layer (Subcutaneous Tissue) exposed. There is no tunneling or undermining noted. There is a medium amount of serosanguineous drainage noted. The wound margin is distinct with the outline attached to the wound base. There is medium (34-66%) pink granulation within the wound bed. There is a medium  (34-66%) amount of necrotic tissue within the wound bed including Adherent Slough. Wound #48 status is Open. Original cause of wound was Pressure Injury. The wound is located on the Right Calcaneus. The wound measures 2cm length x 2.5cm width x 0.1cm depth; 3.927cm^2 area and 0.393cm^3 volume. There is Fat Layer (Subcutaneous Tissue) exposed. There is no tunneling or undermining noted. There is a small amount of serosanguineous drainage noted. The wound margin is flat and intact. There is small (1-33%) pink granulation within the wound bed. There is a large (67-100%) amount of necrotic tissue within the wound bed including Eschar. 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 Bathing/ Shower/ Hygiene: May shower with protection but do not get wound dressing(s) wet. Other Bathing/Shower/Hygiene Orders/Instructions: - scrub legs gently with wash cloth and soap and water with dressing changes Edema Control - Lymphedema / SCD / Other: Elevate legs to the level of the heart or above for 30 minutes daily and/or when sitting, a frequency of: Off-Loading: Heel suspension boot to: - heel protectors at all times Other: - avoid any pressure to back of heels Home Health: No change in wound care orders this week; continue Home Health for wound care. May utilize formulary equivalent dressing for wound treatment orders unless otherwise specified. Other Home Health Orders/Instructions: - [facility] WOUND #41: - T Fourth oe Wound Laterality: Right Cleanser: Soap and  Water (Home Health) 2 x Per Week/30 Days Discharge Instructions: May shower and wash wound with dial antibacterial soap and water prior to dressing change. Cleanser: Wound Cleanser (Home Health) 2 x Per Week/30 Days Discharge Instructions: Cleanse the wound with wound cleanser prior to applying a clean dressing using gauze sponges, not tissue or cotton balls. Peri-Wound Care: Sween Lotion (Moisturizing lotion) 2 x Per Week/30 Days Discharge Instructions: Apply moisturizing lotion with dressing changes Topical: Bactroban (Home Health) 2 x Per Week/30 Days Discharge Instructions: apply to wound bed under alginate Prim Dressing: KerraCel Ag Gelling Fiber Dressing, 4x5 in (silver alginate) (Home Health) 2 x Per Week/30 Days ary Discharge Instructions: Apply silver alginate to wound bed as instructed Secondary Dressing: Woven Gauze Sponge, Non-Sterile 4x4 in (Home Health) 2 x Per Week/30 Days Discharge Instructions: Apply over primary dressing as directed. Secondary Dressing: ABD Pad, 5x9 (Home Health) 2 x Per Week/30 Days Discharge Instructions: Apply over primary dressing as directed. Com pression Wrap: Kerlix Roll 4.5x3.1 (in/yd) (Home Health) 2 x Per Week/30 Days Discharge Instructions: Apply Kerlix and Coban compression as directed. Com pression Wrap: Coban Self-Adherent Wrap 4x5 (in/yd) (Home Health) 2 x Per Week/30 Days Discharge Instructions: Apply over Kerlix as directed. WOUND #43: - Lower Leg Wound Laterality: Right, Anterior, Distal Cleanser: Soap and Water (Home Health) 2 x Per Week/30 Days Discharge Instructions: May shower and wash wound with dial antibacterial soap and water prior to dressing change. Cleanser:  Wound Cleanser (Home Health) 2 x Per Week/30 Days Discharge Instructions: Cleanse the wound with wound cleanser prior to applying a clean dressing using gauze sponges, not tissue or cotton balls. Peri-Wound Care: Sween Lotion (Moisturizing lotion) 2 x Per Week/30  Days Discharge Instructions: Apply moisturizing lotion with dressing changes Topical: Bactroban (Home Health) 2 x Per Week/30 Days Discharge Instructions: apply to wound bed under alginate Prim Dressing: KerraCel Ag Gelling Fiber Dressing, 4x5 in (silver alginate) (Home Health) 2 x Per Week/30 Days ary Discharge Instructions: Apply silver alginate to wound bed as instructed Secondary Dressing: Woven Gauze Sponge, Non-Sterile 4x4 in (Home Health) 2 x Per Week/30 Days Discharge Instructions: Apply over primary dressing as directed. Secondary Dressing: ABD Pad, 5x9 (Home Health) 2 x Per Week/30 Days Discharge Instructions: Apply over primary dressing as directed. Com pression Wrap: Kerlix Roll 4.5x3.1 (in/yd) (Home Health) 2 x Per Week/30 Days Discharge Instructions: Apply Kerlix and Coban compression as directed. Com pression Wrap: Coban Self-Adherent Wrap 4x5 (in/yd) (Home Health) 2 x Per Week/30 Days Discharge Instructions: Apply over Kerlix as directed. WOUND #44: - Foot Wound Laterality: Right, Lateral, Proximal Cleanser: Soap and Water (Home Health) 2 x Per Week/30 Days Discharge Instructions: May shower and wash wound with dial antibacterial soap and water prior to dressing change. Cleanser: Wound Cleanser (Home Health) 2 x Per Week/30 Days Discharge Instructions: Cleanse the wound with wound cleanser prior to applying a clean dressing using gauze sponges, not tissue or cotton balls. Peri-Wound Care: Sween Lotion (Moisturizing lotion) 2 x Per Week/30 Days Discharge Instructions: Apply moisturizing lotion with dressing changes Topical: Bactroban (Home Health) 2 x Per Week/30 Days Discharge Instructions: apply to wound bed under alginate Prim Dressing: KerraCel Ag Gelling Fiber Dressing, 4x5 in (silver alginate) (Home Health) 2 x Per Week/30 Days ary Discharge Instructions: Apply silver alginate to wound bed as instructed Secondary Dressing: Woven Gauze Sponge, Non-Sterile 4x4 in (Home  Health) 2 x Per Week/30 Days Discharge Instructions: Apply over primary dressing as directed. Secondary Dressing: ABD Pad, 5x9 (Home Health) 2 x Per Week/30 Days Discharge Instructions: Apply over primary dressing as directed. Com pression Wrap: Kerlix Roll 4.5x3.1 (in/yd) (Home Health) 2 x Per Week/30 Days Discharge Instructions: Apply Kerlix and Coban compression as directed. Com pression Wrap: Coban Self-Adherent Wrap 4x5 (in/yd) (Home Health) 2 x Per Week/30 Days Discharge Instructions: Apply over Kerlix as directed. WOUND #47: - Malleolus Wound Laterality: Left, Medial Cleanser: Soap and Water (Home Health) 2 x Per Week/30 Days Discharge Instructions: May shower and wash wound with dial antibacterial soap and water prior to dressing change. Cleanser: Wound Cleanser (Home Health) 2 x Per Week/30 Days Discharge Instructions: Cleanse the wound with wound cleanser prior to applying a clean dressing using gauze sponges, not tissue or cotton balls. Peri-Wound Care: Sween Lotion (Moisturizing lotion) (Home Health) 2 x Per Week/30 Days Discharge Instructions: Apply moisturizing lotion with dressing changes Prim Dressing: Hydrofera Blue Classic Foam, 4x4 in (Home Health) 2 x Per Week/30 Days ary Discharge Instructions: Apply to wound bed as instructed Secondary Dressing: Woven Gauze Sponge, Non-Sterile 4x4 in 2 x Per Week/30 Days Discharge Instructions: Apply over primary dressing as directed. Com pression Wrap: Kerlix Roll 4.5x3.1 (in/yd) (Home Health) 2 x Per Week/30 Days Discharge Instructions: Apply Kerlix and Coban compression , be sure to wrap from base of toes to tibial tuberosity. no too tight Com pression Wrap: Coban Self-Adherent Wrap 4x5 (in/yd) (Home Health) 2 x Per Week/30 Days Discharge Instructions: Apply over Kerlix as directed.  WOUND #48: - Calcaneus Wound Laterality: Right Cleanser: Soap and Water (Home Health) 2 x Per Week/30 Days Discharge Instructions: May shower and wash  wound with dial antibacterial soap and water prior to dressing change. Cleanser: Wound Cleanser (Home Health) 2 x Per Week/30 Days Discharge Instructions: Cleanse the wound with wound cleanser prior to applying a clean dressing using gauze sponges, not tissue or cotton balls. Peri-Wound Care: Sween Lotion (Moisturizing lotion) 2 x Per Week/30 Days Discharge Instructions: Apply moisturizing lotion with dressing changes Topical: Bactroban (Home Health) 2 x Per Week/30 Days Discharge Instructions: apply to wound bed under alginate Prim Dressing: KerraCel Ag Gelling Fiber Dressing, 4x5 in (silver alginate) (Home Health) 2 x Per Week/30 Days ary Discharge Instructions: Apply silver alginate to wound bed as instructed Secondary Dressing: Woven Gauze Sponge, Non-Sterile 4x4 in (Home Health) 2 x Per Week/30 Days Discharge Instructions: Apply over primary dressing as directed. Secondary Dressing: ABD Pad, 5x9 (Home Health) 2 x Per Week/30 Days Discharge Instructions: Apply over primary dressing as directed. Secondary Dressing: ALLEVYN Heel 4 1/2in x 5 1/2in / 10.5cm x 13.5cm (Home Health) 2 x Per Week/30 Days Discharge Instructions: Apply over primary dressing as directed. Com pression Wrap: Kerlix Roll 4.5x3.1 (in/yd) (Home Health) 2 x Per Week/30 Days Discharge Instructions: Apply Kerlix and Coban compression as directed. Com pression Wrap: Coban Self-Adherent Wrap 4x5 (in/yd) (Home Health) 2 x Per Week/30 Days Discharge Instructions: Apply over Kerlix as directed. 1. I am continuing with the Hydrofera Blue on the left medial ankle and silver alginate to the wounds on the right foot 2. I will continue to follow him on a 2-week basis. 3. 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.322 Non-pressure chronic ulcer of left ankle with fat layer exposed L89.616 Pressure-induced deep tissue damage of right heel Facility Procedures CPT4 Code: [id number] Description: [phone] - WOUND CARE VISIT-LEV 5 EST PT Modifier: Quantity: 1 Physician Procedures : CPT4 Code Description Modifier [id number] - WC PHYS LEVEL 3 - EST PT ICD-10 Diagnosis Description L97.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 layer exposed L97.312 Non-pressure chronic ulcer of right ankle with fat layer  exposed L97.322 Non-pressure chronic ulcer of left ankle with fat layer exposed L89.616 Pressure-induced deep tissue damage of right heel Quantity: 1 Electronic Signature(s) Signed: 07/07/2020 5:36:32 PM By: Baltazar Najjar MD Entered By: Baltazar Najjar on 07/07/2020 17:35:09

## 2020-07-07 NOTE — Progress Notes (Signed)
Complex Wound Cleansing - multiple wounds X- 1 5 Wound Imaging (photographs - any number of wounds) []  - 0 Wound Tracing (instead of photographs) []  - 0 Simple Wound Measurement - one wound X- 2 5 Complex Wound Measurement - multiple wounds INTERVENTIONS - Wound Dressings []  - 0 Small Wound Dressing one or multiple wounds X- 2 15 Medium Wound Dressing one or multiple wounds []  - 0 Large Wound Dressing one or multiple wounds X- 1 5 Application of Medications - topical []  - 0 Application of Medications - injection INTERVENTIONS - Miscellaneous []  - 0 External ear exam []  - 0 Specimen Collection (cultures, biopsies, blood, body fluids, etc.) []  - 0 Specimen(s) / Culture(s) sent or taken to Lab for analysis []  - 0 Patient Transfer (multiple staff / / Similar devices) []  - 0 Simple Staple / Suture removal (25 or less) []  - 0 Complex Staple / Suture removal (26 or more) []  - 0 Hypo / Hyperglycemic Management (close monitor of Blood Glucose) []  - 0 Ankle / Brachial Index (ABI) - do not check if billed separately X- 1 5 Vital Signs Has the patient been seen at the hospital within the last three years: Yes Total Score: 165 Level Of Care: New/Established - Level 5 Electronic Signature(s) Signed: 07/07/2020 5:49:38 PM By: RN, BSN Entered By: on 07/07/2020 11:46:43 -------------------------------------------------------------------------------- Lower Extremity Assessment Details Patient Name: Date of Service: [name], [name] 07/07/2020 10:45 A M Medical Record Number: Patient Account Number: Date of Birth/Sex: Treating RN: [birth year] (60 y.o. , [name] Primary Care Juaquina Machnik: Other  Clinician: Referring Amberlynn Tempesta: Treating Cynde Menard/Extender: , [name] in Treatment: 15 Edema Assessment Assessed: 09/04/2020: Yes] Zenaida Deed: Yes] Edema: [Left: Yes] [Right: Yes] Calf Left: Right: Point of Measurement: From Medial Instep 29 cm 26 cm Ankle Left: Right: Point of Measurement: From Medial Instep 19 cm 16 cm Vascular Assessment Pulses: Dorsalis Pedis Palpable: [Left:Yes] [Right:Yes] Posterior Tibial Palpable: [Left:Yes] [Right:Yes] Electronic Signature(s) Signed: 07/07/2020 5:40:25 PM By: 09/04/2020 RN Entered By: [name] on 07/07/2020 11:16:55 -------------------------------------------------------------------------------- Multi Wound Chart Details Patient Name: Date of Service: [id number] 07/07/2020 10:45 A M Medical Record Number: 03/27/1961 Patient Account Number: [id number] Date of Birth/Sex: Treating RN: [birth year] (60 y.o. [name] Primary Care Carmen Vallecillo: Kyra Searles Other Clinician: Referring Adrienne Trombetta: Treating Liliann File/Extender: Franne Forts, [name] in Treatment: 15 Vital Signs Height(in): Pulse(bpm): 92 Weight(lbs): Blood Pressure(mmHg): 157/89 Body Mass Index(BMI): Temperature(F): 97.4 Respiratory Rate(breaths/min): 17 Photos: [41:No Photos Right T Fourth oe] [43:No Photos Right, Distal, Anterior Lower Leg] [44:No Photos Right, Proximal, Lateral Foot] Wound Location: [41:Trauma] [43:Pressure Injury] [44:Pressure Injury] Wounding Event: [41:Lymphedema] [43:Pressure Ulcer] [44:Pressure Ulcer] Primary Etiology: [41:N/A] [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] [44:N/A] Secondary Etiology: [41:Anemia, Deep Vein Thrombosis,] [43:Anemia, Deep Vein Thrombosis,] [44:Anemia, Deep Vein Thrombosis,] Comorbid History: [41:Colitis, Osteomyelitis, Neuropathy, Colitis, Osteomyelitis, Neuropathy, Colitis, Osteomyelitis, Neuropathy, Quadriplegia, Confinement Anxiety 02/21/2020] [43:Quadriplegia, Confinement  Anxiety 02/21/2020] [44:Quadriplegia, Confinement  Anxiety 02/21/2020] Date Acquired: [41:15] [43:15] [44:15] Weeks of Treatment: [41:Open] [43:Open] [44:Open] Wound Status: [41:No] [43:Yes] [44:No] Clustered Wound: [41:N/A] [43:1] [44:N/A] Clustered Quantity: [41:0.6x0.5x0.1] [43:0.1x0.1x0.1] [44:4x2.8x0.2] Measurements L x W x D (cm) [41:0.236] [43:0.008] [44:8.796] A (cm) : rea [41:0.024] [43:0.001] [44:1.759] Volume (cm) : [41:83.30%] [43:99.40%] [44:-16.50%] % Reduction in Area: [41:83.00%] [43:99.60%] [44:22.30%] % Reduction in Volume: [41:Full Thickness Without Exposed] [43:Category/Stage III] [44:Category/Stage IV] Classification: [41:Support Structures Small] [43:Medium] [44:Medium] Exudate A mount: [41:Serosanguineous] [43:Serosanguineous] [44:Serosanguineous] Exudate Type: [41:red, brown] [43:red, brown] [44:red, brown] Exudate  CULLIN, DISHMAN ([id number]) Visit Report for 07/07/2020 Arrival Information Details Patient Name: Date of Service: [name] 07/07/2020 10:45 A M Medical Record Number: [id number] Patient Account Number: [id number] Date of Birth/Sex: Treating RN: 1960-07-14 (61 y.o. [name], [name] Primary Care Eliska Hamil: [name] Other Clinician: Referring Tyeesha Riker: Treating Jasime Westergren/Extender: [name], [name] in Treatment: 15 Visit Information History Since Last Visit All ordered tests and consults were completed: No Patient Arrived: Wheel Chair Added or deleted any medications: No Arrival Time: 11:08 Any new allergies or adverse reactions: No Accompanied By: cg Had a fall or experienced change in No Transfer Assistance: None activities of daily living that may affect Patient Identification Verified: Yes risk of falls: Secondary Verification Process Completed: Yes Signs or symptoms of abuse/neglect since last visito No Patient Requires Transmission-Based Precautions: No Hospitalized since last visit: No Patient Has Alerts: No Implantable device outside of the clinic excluding No cellular tissue based products placed in the center since last visit: Has Dressing in Place as Prescribed: Yes Pain Present Now: Yes Electronic Signature(s) Signed: 07/07/2020 5:40:25 PM By: [name] RN Entered By: [name] on 07/07/2020 11:09:11 -------------------------------------------------------------------------------- Clinic Level of Care Assessment Details Patient Name: Date of Service: KAIEA, ESSELMAN 07/07/2020 10:45 A M Medical Record Number: [id number] Patient Account Number: [id number] Date of Birth/Sex: Treating RN: 08-08-1960 (60 y.o. [name] Primary Care Cai Flott: [name] Other Clinician: Referring Suleyman Ehrman: Treating Quierra Silverio/Extender: [name], [name] in Treatment: 15 Clinic Level of Care  Assessment Items TOOL 4 Quantity Score []  - 0 Use when only an EandM is performed on FOLLOW-UP visit ASSESSMENTS - Nursing Assessment / Reassessment X- 1 10 Reassessment of Co-morbidities (includes updates in patient status) X- 1 5 Reassessment of Adherence to Treatment Plan ASSESSMENTS - Wound and Skin A ssessment / Reassessment []  - 0 Simple Wound Assessment / Reassessment - one wound X- 5 5 Complex Wound Assessment / Reassessment - multiple wounds []  - 0 Dermatologic / Skin Assessment (not related to wound area) ASSESSMENTS - Focused Assessment X- 2 5 Circumferential Edema Measurements - multi extremities []  - 0 Nutritional Assessment / Counseling / Intervention X- 1 5 Lower Extremity Assessment (monofilament, tuning fork, pulses) []  - 0 Peripheral Arterial Disease Assessment (using hand held doppler) ASSESSMENTS - Ostomy and/or Continence Assessment and Care []  - 0 Incontinence Assessment and Management []  - 0 Ostomy Care Assessment and Management (repouching, etc.) PROCESS - Coordination of Care X - Simple Patient / Family Education for ongoing care 1 15 []  - 0 Complex (extensive) Patient / Family Education for ongoing care X- 1 10 Staff obtains , Records, T Results / Process Orders est X- 1 10 Staff telephones HHA, Nursing Homes / Clarify orders / etc []  - 0 Routine Transfer to another Facility (non-emergent condition) []  - 0 Routine Hospital Admission (non-emergent condition) []  - 0 New Admissions / / Ordering NPWT Apligraf, etc. , []  - 0 Emergency Hospital Admission (emergent condition) X- 1 10 Simple Discharge Coordination []  - 0 Complex (extensive) Discharge Coordination PROCESS - Special Needs []  - 0 Pediatric / Minor Patient Management []  - 0 Isolation Patient Management []  - 0 Hearing / Language / Visual special needs []  - 0 Assessment of Community assistance (transportation, D/C planning, etc.) []  -  0 Additional assistance / Altered mentation []  - 0 Support Surface(s) Assessment (bed, cushion, seat, etc.) INTERVENTIONS - Wound Cleansing / Measurement []  - 0 Simple Wound Cleansing - one wound X- 2 5  Color: [41:Flat and Intact] [43:Flat and Intact] [44:Flat and Intact] Wound Margin: [41:Large (67-100%)] [43:Large (67-100%)] [44:Medium (34-66%)] Granulation Amount: [41:Red] [43:Red] [44:Red, Pink] Granulation Quality: [41:None Present (0%)] [43:None Present (0%)] [44:Medium (34-66%)] Necrotic Amount: [41:N/A] [43:N/A] [44:Adherent Slough] Necrotic Tissue: [41:Fat Layer (Subcutaneous Tissue): Yes Fat Layer (Subcutaneous Tissue): Yes Fat Layer (Subcutaneous Tissue): Yes] Exposed Structures: [41:Fascia: No Tendon: No Muscle: No Joint: No Bone: No Medium (34-66%)] [43:Fascia: No Tendon: No Muscle: No Joint: No Bone: No Medium (34-66%)] [44:Tendon: Yes Fascia: No Muscle: No Joint: No Bone: No Small (1-33%)] Wound Number: [id number] N/A Photos: No Photos No Photos N/A Left, Medial Malleolus Right Calcaneus N/A Wound Location: Gradually Appeared Pressure Injury N/A Wounding Event: Venous Leg Ulcer Pressure Ulcer N/A Primary Etiology: N/A N/A N/A Secondary Etiology: Anemia, Deep Vein Thrombosis, Anemia, Deep Vein Thrombosis, N/A Comorbid History: Colitis,  Osteomyelitis, Neuropathy, Colitis, Osteomyelitis, Neuropathy, Quadriplegia, Confinement Anxiety Quadriplegia, Confinement Anxiety 05/22/2020 06/02/2020 N/A Date Acquired: 6 5 N/A Weeks of Treatment: Open Open N/A Wound Status: No No N/A Clustered Wound: N/A N/A N/A Clustered Quantity: 1.3x2.1x0.1 2x2.5x0.1 N/A Measurements L x W x D (cm) 2.144 3.927 N/A A (cm) : rea 0.214 0.393 N/A Volume (cm) : 72.10% 4.80% N/A % Reduction in Area: 72.20% 4.60% N/A % Reduction in Volume: Full Thickness Without Exposed Unstageable/Unclassified N/A Classification: Support Structures Medium Small N/A Exudate A mount: Serosanguineous Serosanguineous N/A Exudate Type: red, brown red, brown N/A Exudate Color: Distinct, outline attached Flat and Intact N/A Wound Margin: Medium (34-66%) Small (1-33%) N/A Granulation Amount: Pink Pink N/A Granulation Quality: Medium (34-66%) Large (67-100%) N/A Necrotic Amount: Adherent Slough Eschar N/A Necrotic Tissue: Fat Layer (Subcutaneous Tissue): Yes Fat Layer (Subcutaneous Tissue): Yes N/A Exposed Structures: Fascia: No Fascia: No Tendon: No Tendon: No Muscle: No Muscle: No Joint: No Joint: No Bone: No Bone: No Small (1-33%) None N/A Epithelialization: Treatment Notes Electronic Signature(s) Signed: 07/07/2020 5:36:32 PM By: Baltazar Najjar MD Signed: 07/07/2020 5:49:38 PM By: Zenaida Deed RN, BSN Entered By: Baltazar Najjar on 07/07/2020 17:29:46 -------------------------------------------------------------------------------- Multi-Disciplinary Care Plan Details Patient Name: Date of Service: [name], [name] 07/07/2020 10:45 A M Medical Record Number: [id number] Patient Account Number: [id number] Date of Birth/Sex: Treating RN: 29-Aug-1960 (59 y.o. [name] Primary Care Reilly Blades: [name] Other Clinician: Referring Jodeci Roarty: Treating Judye Lorino/Extender: [name], [name] in Treatment:  15 Active Inactive Wound/Skin Impairment Nursing Diagnoses: Knowledge deficit related to ulceration/compromised skin integrity Goals: Patient/caregiver will verbalize understanding of skin care regimen Date Initiated: 03/23/2020 Target Resolution Date: 07/21/2020 Goal Status: Active Ulcer/skin breakdown will have a volume reduction of 30% by week 4 Date Initiated: 03/31/2020 Date Inactivated: 04/28/2020 Target Resolution Date: 04/21/2020 Goal Status: Unmet Unmet Reason: infection, Ulcer/skin breakdown will have a volume reduction of 50% by week 8 Date Initiated: 04/28/2020 Date Inactivated: 05/22/2020 Target Resolution Date: 05/19/2020 Goal Status: Unmet Unmet Reason: eschar, infection Ulcer/skin breakdown will have a volume reduction of 80% by week 12 Date Initiated: 05/22/2020 Date Inactivated: 06/23/2020 Target Resolution Date: 07/21/2020 Goal Status: Unmet Unmet Reason: infection Interventions: Assess patient/caregiver ability to perform ulcer/skin care regimen upon admission and as needed Provide education on ulcer and skin care Treatment Activities: Skin care regimen initiated : 03/23/2020 Topical wound management initiated : 03/23/2020 Notes: Electronic Signature(s) Signed: 07/07/2020 5:49:38 PM By: Zenaida Deed RN, BSN Entered By: Zenaida Deed on 07/07/2020 11:45:15 -------------------------------------------------------------------------------- Pain Assessment Details Patient Name: Date of Service: [name], [name] 07/07/2020 10:45 A M Medical Record Number: [id number] Patient Account Number: [id number] Date  Complex Wound Cleansing - multiple wounds X- 1 5 Wound Imaging (photographs - any number of wounds) []  - 0 Wound Tracing (instead of photographs) []  - 0 Simple Wound Measurement - one wound X- 2 5 Complex Wound Measurement - multiple wounds INTERVENTIONS - Wound Dressings []  - 0 Small Wound Dressing one or multiple wounds X- 2 15 Medium Wound Dressing one or multiple wounds []  - 0 Large Wound Dressing one or multiple wounds X- 1 5 Application of Medications - topical []  - 0 Application of Medications - injection INTERVENTIONS - Miscellaneous []  - 0 External ear exam []  - 0 Specimen Collection (cultures, biopsies, blood, body fluids, etc.) []  - 0 Specimen(s) / Culture(s) sent or taken to Lab for analysis []  - 0 Patient Transfer (multiple staff / / Similar devices) []  - 0 Simple Staple / Suture removal (25 or less) []  - 0 Complex Staple / Suture removal (26 or more) []  - 0 Hypo / Hyperglycemic Management (close monitor of Blood Glucose) []  - 0 Ankle / Brachial Index (ABI) - do not check if billed separately X- 1 5 Vital Signs Has the patient been seen at the hospital within the last three years: Yes Total Score: 165 Level Of Care: New/Established - Level 5 Electronic Signature(s) Signed: 07/07/2020 5:49:38 PM By: RN, BSN Entered By: on 07/07/2020 11:46:43 -------------------------------------------------------------------------------- Lower Extremity Assessment Details Patient Name: Date of Service: [name], [name] 07/07/2020 10:45 A M Medical Record Number: Patient Account Number: Date of Birth/Sex: Treating RN: [birth year] (60 y.o. , [name] Primary Care Juaquina Machnik: Other  Clinician: Referring Amberlynn Tempesta: Treating Cynde Menard/Extender: , [name] in Treatment: 15 Edema Assessment Assessed: 09/04/2020: Yes] Zenaida Deed: Yes] Edema: [Left: Yes] [Right: Yes] Calf Left: Right: Point of Measurement: From Medial Instep 29 cm 26 cm Ankle Left: Right: Point of Measurement: From Medial Instep 19 cm 16 cm Vascular Assessment Pulses: Dorsalis Pedis Palpable: [Left:Yes] [Right:Yes] Posterior Tibial Palpable: [Left:Yes] [Right:Yes] Electronic Signature(s) Signed: 07/07/2020 5:40:25 PM By: 09/04/2020 RN Entered By: [name] on 07/07/2020 11:16:55 -------------------------------------------------------------------------------- Multi Wound Chart Details Patient Name: Date of Service: [id number] 07/07/2020 10:45 A M Medical Record Number: 03/27/1961 Patient Account Number: [id number] Date of Birth/Sex: Treating RN: [birth year] (60 y.o. [name] Primary Care Carmen Vallecillo: Kyra Searles Other Clinician: Referring Adrienne Trombetta: Treating Liliann File/Extender: Franne Forts, [name] in Treatment: 15 Vital Signs Height(in): Pulse(bpm): 92 Weight(lbs): Blood Pressure(mmHg): 157/89 Body Mass Index(BMI): Temperature(F): 97.4 Respiratory Rate(breaths/min): 17 Photos: [41:No Photos Right T Fourth oe] [43:No Photos Right, Distal, Anterior Lower Leg] [44:No Photos Right, Proximal, Lateral Foot] Wound Location: [41:Trauma] [43:Pressure Injury] [44:Pressure Injury] Wounding Event: [41:Lymphedema] [43:Pressure Ulcer] [44:Pressure Ulcer] Primary Etiology: [41:N/A] [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] [44:N/A] Secondary Etiology: [41:Anemia, Deep Vein Thrombosis,] [43:Anemia, Deep Vein Thrombosis,] [44:Anemia, Deep Vein Thrombosis,] Comorbid History: [41:Colitis, Osteomyelitis, Neuropathy, Colitis, Osteomyelitis, Neuropathy, Colitis, Osteomyelitis, Neuropathy, Quadriplegia, Confinement Anxiety 02/21/2020] [43:Quadriplegia, Confinement  Anxiety 02/21/2020] [44:Quadriplegia, Confinement  Anxiety 02/21/2020] Date Acquired: [41:15] [43:15] [44:15] Weeks of Treatment: [41:Open] [43:Open] [44:Open] Wound Status: [41:No] [43:Yes] [44:No] Clustered Wound: [41:N/A] [43:1] [44:N/A] Clustered Quantity: [41:0.6x0.5x0.1] [43:0.1x0.1x0.1] [44:4x2.8x0.2] Measurements L x W x D (cm) [41:0.236] [43:0.008] [44:8.796] A (cm) : rea [41:0.024] [43:0.001] [44:1.759] Volume (cm) : [41:83.30%] [43:99.40%] [44:-16.50%] % Reduction in Area: [41:83.00%] [43:99.60%] [44:22.30%] % Reduction in Volume: [41:Full Thickness Without Exposed] [43:Category/Stage III] [44:Category/Stage IV] Classification: [41:Support Structures Small] [43:Medium] [44:Medium] Exudate A mount: [41:Serosanguineous] [43:Serosanguineous] [44:Serosanguineous] Exudate Type: [41:red, brown] [43:red, brown] [44:red, brown] Exudate  Complex Wound Cleansing - multiple wounds X- 1 5 Wound Imaging (photographs - any number of wounds) []  - 0 Wound Tracing (instead of photographs) []  - 0 Simple Wound Measurement - one wound X- 2 5 Complex Wound Measurement - multiple wounds INTERVENTIONS - Wound Dressings []  - 0 Small Wound Dressing one or multiple wounds X- 2 15 Medium Wound Dressing one or multiple wounds []  - 0 Large Wound Dressing one or multiple wounds X- 1 5 Application of Medications - topical []  - 0 Application of Medications - injection INTERVENTIONS - Miscellaneous []  - 0 External ear exam []  - 0 Specimen Collection (cultures, biopsies, blood, body fluids, etc.) []  - 0 Specimen(s) / Culture(s) sent or taken to Lab for analysis []  - 0 Patient Transfer (multiple staff / / Similar devices) []  - 0 Simple Staple / Suture removal (25 or less) []  - 0 Complex Staple / Suture removal (26 or more) []  - 0 Hypo / Hyperglycemic Management (close monitor of Blood Glucose) []  - 0 Ankle / Brachial Index (ABI) - do not check if billed separately X- 1 5 Vital Signs Has the patient been seen at the hospital within the last three years: Yes Total Score: 165 Level Of Care: New/Established - Level 5 Electronic Signature(s) Signed: 07/07/2020 5:49:38 PM By: RN, BSN Entered By: on 07/07/2020 11:46:43 -------------------------------------------------------------------------------- Lower Extremity Assessment Details Patient Name: Date of Service: [name], [name] 07/07/2020 10:45 A M Medical Record Number: Patient Account Number: Date of Birth/Sex: Treating RN: [birth year] (60 y.o. , [name] Primary Care Juaquina Machnik: Other  Clinician: Referring Amberlynn Tempesta: Treating Cynde Menard/Extender: , [name] in Treatment: 15 Edema Assessment Assessed: 09/04/2020: Yes] Zenaida Deed: Yes] Edema: [Left: Yes] [Right: Yes] Calf Left: Right: Point of Measurement: From Medial Instep 29 cm 26 cm Ankle Left: Right: Point of Measurement: From Medial Instep 19 cm 16 cm Vascular Assessment Pulses: Dorsalis Pedis Palpable: [Left:Yes] [Right:Yes] Posterior Tibial Palpable: [Left:Yes] [Right:Yes] Electronic Signature(s) Signed: 07/07/2020 5:40:25 PM By: 09/04/2020 RN Entered By: [name] on 07/07/2020 11:16:55 -------------------------------------------------------------------------------- Multi Wound Chart Details Patient Name: Date of Service: [id number] 07/07/2020 10:45 A M Medical Record Number: 03/27/1961 Patient Account Number: [id number] Date of Birth/Sex: Treating RN: [birth year] (60 y.o. [name] Primary Care Carmen Vallecillo: Kyra Searles Other Clinician: Referring Adrienne Trombetta: Treating Liliann File/Extender: Franne Forts, [name] in Treatment: 15 Vital Signs Height(in): Pulse(bpm): 92 Weight(lbs): Blood Pressure(mmHg): 157/89 Body Mass Index(BMI): Temperature(F): 97.4 Respiratory Rate(breaths/min): 17 Photos: [41:No Photos Right T Fourth oe] [43:No Photos Right, Distal, Anterior Lower Leg] [44:No Photos Right, Proximal, Lateral Foot] Wound Location: [41:Trauma] [43:Pressure Injury] [44:Pressure Injury] Wounding Event: [41:Lymphedema] [43:Pressure Ulcer] [44:Pressure Ulcer] Primary Etiology: [41:N/A] [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] [44:N/A] Secondary Etiology: [41:Anemia, Deep Vein Thrombosis,] [43:Anemia, Deep Vein Thrombosis,] [44:Anemia, Deep Vein Thrombosis,] Comorbid History: [41:Colitis, Osteomyelitis, Neuropathy, Colitis, Osteomyelitis, Neuropathy, Colitis, Osteomyelitis, Neuropathy, Quadriplegia, Confinement Anxiety 02/21/2020] [43:Quadriplegia, Confinement  Anxiety 02/21/2020] [44:Quadriplegia, Confinement  Anxiety 02/21/2020] Date Acquired: [41:15] [43:15] [44:15] Weeks of Treatment: [41:Open] [43:Open] [44:Open] Wound Status: [41:No] [43:Yes] [44:No] Clustered Wound: [41:N/A] [43:1] [44:N/A] Clustered Quantity: [41:0.6x0.5x0.1] [43:0.1x0.1x0.1] [44:4x2.8x0.2] Measurements L x W x D (cm) [41:0.236] [43:0.008] [44:8.796] A (cm) : rea [41:0.024] [43:0.001] [44:1.759] Volume (cm) : [41:83.30%] [43:99.40%] [44:-16.50%] % Reduction in Area: [41:83.00%] [43:99.60%] [44:22.30%] % Reduction in Volume: [41:Full Thickness Without Exposed] [43:Category/Stage III] [44:Category/Stage IV] Classification: [41:Support Structures Small] [43:Medium] [44:Medium] Exudate A mount: [41:Serosanguineous] [43:Serosanguineous] [44:Serosanguineous] Exudate Type: [41:red, brown] [43:red, brown] [44:red, brown] Exudate  Complex Wound Cleansing - multiple wounds X- 1 5 Wound Imaging (photographs - any number of wounds) []  - 0 Wound Tracing (instead of photographs) []  - 0 Simple Wound Measurement - one wound X- 2 5 Complex Wound Measurement - multiple wounds INTERVENTIONS - Wound Dressings []  - 0 Small Wound Dressing one or multiple wounds X- 2 15 Medium Wound Dressing one or multiple wounds []  - 0 Large Wound Dressing one or multiple wounds X- 1 5 Application of Medications - topical []  - 0 Application of Medications - injection INTERVENTIONS - Miscellaneous []  - 0 External ear exam []  - 0 Specimen Collection (cultures, biopsies, blood, body fluids, etc.) []  - 0 Specimen(s) / Culture(s) sent or taken to Lab for analysis []  - 0 Patient Transfer (multiple staff / / Similar devices) []  - 0 Simple Staple / Suture removal (25 or less) []  - 0 Complex Staple / Suture removal (26 or more) []  - 0 Hypo / Hyperglycemic Management (close monitor of Blood Glucose) []  - 0 Ankle / Brachial Index (ABI) - do not check if billed separately X- 1 5 Vital Signs Has the patient been seen at the hospital within the last three years: Yes Total Score: 165 Level Of Care: New/Established - Level 5 Electronic Signature(s) Signed: 07/07/2020 5:49:38 PM By: RN, BSN Entered By: on 07/07/2020 11:46:43 -------------------------------------------------------------------------------- Lower Extremity Assessment Details Patient Name: Date of Service: [name], [name] 07/07/2020 10:45 A M Medical Record Number: Patient Account Number: Date of Birth/Sex: Treating RN: [birth year] (60 y.o. , [name] Primary Care Juaquina Machnik: Other  Clinician: Referring Amberlynn Tempesta: Treating Cynde Menard/Extender: , [name] in Treatment: 15 Edema Assessment Assessed: 09/04/2020: Yes] Zenaida Deed: Yes] Edema: [Left: Yes] [Right: Yes] Calf Left: Right: Point of Measurement: From Medial Instep 29 cm 26 cm Ankle Left: Right: Point of Measurement: From Medial Instep 19 cm 16 cm Vascular Assessment Pulses: Dorsalis Pedis Palpable: [Left:Yes] [Right:Yes] Posterior Tibial Palpable: [Left:Yes] [Right:Yes] Electronic Signature(s) Signed: 07/07/2020 5:40:25 PM By: 09/04/2020 RN Entered By: [name] on 07/07/2020 11:16:55 -------------------------------------------------------------------------------- Multi Wound Chart Details Patient Name: Date of Service: [id number] 07/07/2020 10:45 A M Medical Record Number: 03/27/1961 Patient Account Number: [id number] Date of Birth/Sex: Treating RN: [birth year] (60 y.o. [name] Primary Care Carmen Vallecillo: Kyra Searles Other Clinician: Referring Adrienne Trombetta: Treating Liliann File/Extender: Franne Forts, [name] in Treatment: 15 Vital Signs Height(in): Pulse(bpm): 92 Weight(lbs): Blood Pressure(mmHg): 157/89 Body Mass Index(BMI): Temperature(F): 97.4 Respiratory Rate(breaths/min): 17 Photos: [41:No Photos Right T Fourth oe] [43:No Photos Right, Distal, Anterior Lower Leg] [44:No Photos Right, Proximal, Lateral Foot] Wound Location: [41:Trauma] [43:Pressure Injury] [44:Pressure Injury] Wounding Event: [41:Lymphedema] [43:Pressure Ulcer] [44:Pressure Ulcer] Primary Etiology: [41:N/A] [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] [44:N/A] Secondary Etiology: [41:Anemia, Deep Vein Thrombosis,] [43:Anemia, Deep Vein Thrombosis,] [44:Anemia, Deep Vein Thrombosis,] Comorbid History: [41:Colitis, Osteomyelitis, Neuropathy, Colitis, Osteomyelitis, Neuropathy, Colitis, Osteomyelitis, Neuropathy, Quadriplegia, Confinement Anxiety 02/21/2020] [43:Quadriplegia, Confinement  Anxiety 02/21/2020] [44:Quadriplegia, Confinement  Anxiety 02/21/2020] Date Acquired: [41:15] [43:15] [44:15] Weeks of Treatment: [41:Open] [43:Open] [44:Open] Wound Status: [41:No] [43:Yes] [44:No] Clustered Wound: [41:N/A] [43:1] [44:N/A] Clustered Quantity: [41:0.6x0.5x0.1] [43:0.1x0.1x0.1] [44:4x2.8x0.2] Measurements L x W x D (cm) [41:0.236] [43:0.008] [44:8.796] A (cm) : rea [41:0.024] [43:0.001] [44:1.759] Volume (cm) : [41:83.30%] [43:99.40%] [44:-16.50%] % Reduction in Area: [41:83.00%] [43:99.60%] [44:22.30%] % Reduction in Volume: [41:Full Thickness Without Exposed] [43:Category/Stage III] [44:Category/Stage IV] Classification: [41:Support Structures Small] [43:Medium] [44:Medium] Exudate A mount: [41:Serosanguineous] [43:Serosanguineous] [44:Serosanguineous] Exudate Type: [41:red, brown] [43:red, brown] [44:red, brown] Exudate

## 2020-07-12 ENCOUNTER — Other Ambulatory Visit: Payer: Self-pay | Admitting: Family Medicine

## 2020-07-19 ENCOUNTER — Other Ambulatory Visit (HOSPITAL_COMMUNITY): Payer: Self-pay | Admitting: Orthopedic Surgery

## 2020-07-21 ENCOUNTER — Other Ambulatory Visit: Payer: Self-pay

## 2020-07-21 ENCOUNTER — Encounter (HOSPITAL_BASED_OUTPATIENT_CLINIC_OR_DEPARTMENT_OTHER): Payer: Medicare Other | Admitting: Internal Medicine

## 2020-07-21 DIAGNOSIS — L97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 layer exposed: Secondary | ICD-10-CM | POA: Diagnosis not present

## 2020-07-21 NOTE — Progress Notes (Signed)
[name], [name] ([id number]) Visit Report for 07/21/2020 HPI Details Patient Name: Date of Service: [name], [name] 07/21/2020 10:45 A M Medical Record Number: [id number] Patient Account Number: [id number] Date of Birth/Sex: Treating RN: [birth year] (60 y.o. [name] Primary Care Provider: [name] Other Clinician: Referring Provider: Treating Provider/Extender: [name], [name] in Treatment: 17 History of Present Illness HPI Description: 11/23/15; this is a patient I remember from a previous stay in this clinic in 2012. The patient says this was for a wound in this same area as currently although I have no information to verify that. Most of the information is provided by his caregiver from the group home where he resides. Apparently he has had a wound in this area for 8 months. He also has erythema around this area and has had multiple rounds of antibiotics apparently with some improvement but the erythema is persists. He apparently had a fracture in the ankle 2 months ago and was seen by Dr. Lajoyce Corners . He apparently had a splint applied and more recently a heel offloading boot. He had home health coming out to a week ago not clear what they are dressing this with but they apparently discharged him perhaps because of one of Dr. 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 He has a Nurse, adult for transferring at home he is nonambulatory and does not wear footwear. He is not a diabetic. Looking through [facility] he appears to have fairly active primary care. The area on the ankle is variably labeled as a pressure area and/or chronic venous insufficiency. He had a recent venous ultrasound on 5/25 that did not show a DVT in the left leg however this was not a reflux study. There was no superficial DVT either. Arterial studies on 10/15/14 showed a left ABI of 1.16 Center right of 1.12 waveforms were triphasic he does not  have significant arterial disease. He has had recent x-rays of the left foot and ankle. The left foot did not show any abnormality. Left ankle x-ray showed a spiral fracture of the left tibial diaphysis without evidence of osteomyelitis. 11/30/15 culture I did last week showed pseudomonas I changed him to oral ciprofloxacin. Erythema is a lot better. 12/07/15 area around the wound shows considerable improvement of the periwound erythema. 12/14/15; the patient has a improvement of the periwound erythema which in retrospect I think with cellulitis successfully treated. We have been using Iodoflex started last week 12/25/15 wound on the left medial malleolus and dorsal left foot. Theraskin #1 applied READMISSION 02/27/16; the patient was admitted to hospital from 8/14 through 01/18/16 initially felt to have cellulitis of his left lower leg however he was noted to have significant persistent pain and swelling. A duplex ultrasound was ordered that was negative. Obie Dredge an x-ray was done that showed a spiral fracture of the left leg. With posterior displacement and angulation at the mid left tibia. Orthopedics saw the patient and splinted the leg. He was then sent to [facility] skilled facility and only return to his group home last week. He has wounds on the left medial malleolus, left dorsal foot and a worrisome area on the plantar left heel which is not really open but may represent a hematoma or a DTI 03/06/16; areas of left anterior and medial ankle both look improved. There are 3 small wounds here. He has a area over the dorsal left first metatarsal head which is not open. In meantime his left  heel has opened 03/14/16 the medial left ankle wounds looks stable to improved. The area on the posterior left heel has opened up and had a very adherent eschar and slept over this 03/21/16 patient has 3 wounds on his left medial ankle is stable area on his left dorsal first metatarsal head appears to be improved.  Continued problems with adherent eschar over the left heel 03/28/16; patient has 3 wounds on his left medial ankle and lower leg all of these appear to be stable. The problem continues to be the left heel and the adherent eschar. This may have been a pressure ulcer from her brace at one point. He has a English as a second language teacher we have been using [employer] and all wound areas. Dressings are being changed by home health 04/04/16; the patient's areas on the medial aspect of the left ankle looks superficial and appear to be gradually closing. The area on the plantar aspects/Achilles aspect of his left heel as surface slough and nonviable subcutaneous tissue requiring debridement. 04/11/16; the areas on the patient's medial left ankle appear to be superficial and appear to be closing. The area on the plantar heel on the left has surface slough and nonviable subcutaneous tissue requiring debridement. I don't think this is too much different from last week there is also undermining 04/25/16; patient is followed in his group home by [facility] home health. The area on the plantar heel is smaller but with still with some depth. We're using Iodoflex to this. The areas on the left ankle looks healthy but dry and somewhat larger. Cause of this is not really clear using Prisma 05/02/16; areas x3 all look improved expecially the areas on the medial ankle. Heel wound is smaller but still deep 05/09/16; all areas 3 look improved on the medial ankle. Heel wound is also contracting. 12/0.9/17 the areas on his left medial ankle are all closed. I think these are largely chronic venous insufficiency however there is no edema. The area on his left heel was a pressure area. READMISSION 08/02/16; this man is a patient that we have had in this clinic on several times before. I believe he has cerebral palsy is nonambulatory. He has chronic venous insufficiency with venous inflammation. He was last year in the fall of 2017 with areas on his  left medial ankle left heel and left dorsal foot. He arrives today with once again a vague history. As mentioned he is nonambulatory he spends most of his day in a pressure offloading boot nevertheless they noted wounds in this area on the medial left ankle 2 weeks ago. There is any other history here I am uncertain 08/15/16; the patient's wounds on his medial ankle actually look better however the area on the left lateral/dorsal foot is covered in black necrotic eschar. Previous formal arterial studies did not show significant arterial disease with normal ABIs and triphasic waveforms. He is immobilized and at risk for pressure although he seems aware protective boots at all times as far as I am aware. 08/22/16; in general his wounds look better. All the wounds on his medial left ankle look improved the area on his lateral foot however continues to be problematic. Patient comes in the clinic today in a new wheelchair he is quite delighted 09/05/16; a wound on the left medial Calf and left dorsal foot are healed. The area on the left lateral foot is improved however the area on the left medial malleolus is inflamed with a considerable degree of surrounding erythema. This  looks like cellulitis. ABIs and clinical exam are within normal limits 09/12/16; the patient continues to have a wound on the left medial calf. I was concerned about cellulitis in this area last week and gave him a course of doxycycline. The wound looks better here. The area on the left lateral foot however is unchanged 09/20/16; the patient came in this week early after traumatizing his toe on the left foot on a table while moving around in his motorized wheelchair. 09/26/16- he is here for follow-up evaluation of his left foot and malleolus ulcers. Since his appointment he abraded his left medial hallux against a table while in his motorized wheelchair, apparently trying to pull up to the table for a meal. He continues to complain of  tenderness to the left lateral foot, surrounding the current ulcer 10/07/16; left foot and malleolus ulcers. 10/14/16; the patient is doing well. He has a wound on the left lateral, left medial malleolus and atraumatic wound on the left great toe. Although these appear to be superficial and closing 10/31/16; the patient is doing well. The area on the left lateral foot, left medial malleolus and the left great toe or all epithelialized. He has home health 11/14/16; patient arrives today with the area on the left great toe healed. The left lateral foot however in the left medial malleolus and the ankle superficial wounds that are slightly larger. He has erythema and tenderness around the area on the left lateral foot. As well he is complaining of dysuria. His caregiver notes that when he gets like this he is more lethargic which indeed seems to be the case today 11/21/16; left great toe is still healed. His wound on the left lateral foot appears healthy and slightly smaller also the area on the left medial ankle. This had black eschar on it which I removed. The remaining wound looks satisfactory 12/12/16; patient hasn't been here in a number of weeks. The area on the left lateral foot is healed however on his medial ankle has 2 open areas one of them covered with a necrotic eschar. He also has a new wound on the right second toe which was apparently trauma induced while he was at a movie 12/27/16; the patient has wounds on his left medial mallelus and left lateral foot that are just about closed. The area on the right second toe which was trauma from last time is still open but again everything looks a lot better here. 01/30/17; patient arrives in clinic today and is really been a long time since I've seen him. The wounds are all epithelialized on the left foot this includes his left lateral foot, left medial malleolus. Apparently was in the ER for what of I think was a fungal infection on his buttock's he was  prescribed Diflucan and doxycycline I think this is already getting better. READMISSION T is a now 60 year old man who has advanced cerebral palsy and functional quadriplegia. We have had him in this clinic multiple times before for wounds erry on his right ankle, left ankle, left dorsal foot and most recently from 08/02/16 through 01/30/17 with his left medial ankle and left foot and finally right second toe. His attendant from the group home where he lives is very knowledgeable about him. She states that they noticed breakdown in his skin in his feet sometime in January although they could not get an appointment until today in this clinic. The previous wounds have always been felt to be secondary to incidental trauma/pressure areas  or possibly some degree of chronic venous insufficiency. He had normal arterial studies in may of 2016. He has definite open wounds on the right lateral foot over the base of the fifth metatarsal, the right second toe DIP, the left medial malleolus. He has concerning areas over the left lateral fifth metatarsal base, retaform purpura over the medial left ankle and perhaps the medial left first toe. 07/21/17; culture we did last week from the base of the right fifth metatarsal grew group G strep. This should've been sensitive to the doxycycline I gave him. The other major wounds are on the right second toe DIP, left medial malleolus, base of the left lateral fifth metatarsal and the medial left ankle and medial left toe. 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 We have macrovascular arterial studies scheduled for Thursday although I don't think this is what the problem is. 07/28/17; patient arrives with really no improvement. He has a wound on the right second toe, lateral right foot, right medial malleolus which was new this week, left medial malleolus, but looked like a DTI on the left great toe. His macrovascular studies are on 07/30/17 although I would be  surprised if this provides all the explanation. He has small areas even on his right second and third toe which look like splinter-type hemorrhages. The question I have is where is this coming from. I'd like to see the vascular studies however before we pursue this. He has had a DVT in the past but is no longer on anticoagulants. I'll need to review his history 08/04/17; the patient had his ARTERIAL STUDIES this showed a right ABI of 1.16 and the left ABI of 1.20. TBI on the left at 0.84. He is at a right first toe amputation. The studies were unchanged from studies in May 2016. It was not felt that he had significant lower extremity arterial disease In the meantime we are using silver alginate to the wounds on the right second toe right lateral foot left medial malleolus. I'm going to change to Silver collagen today. 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 Working him up for this would include an echocardiogram probably a CT scan of the chest and abdomen to look at the major thoracic and abdominal aortic vessels. I am not going to involve myself in this unless there is more symptoms/signs 08/18/17; the patient arrives today with wounds looking somewhat better. The remaining areas are the right second toe dorsal, right lateral foot and left medial malleolus which only has a small open area. We've been using silver collagen 09/01/17;right second toe dorsally probes to bone today. This is a deterioration. We've been using silver collagen. Only a small open area remains on the left medial malleolus. 09/09/17; right second toe appears better. There is no probing bone. He has a small area remaining on the left medial malleolus, the right lateral foot. We have been using silver collagen 10/07/17; the patient returns to clinic today after a month hiatus. He has wounds over the right  second toe, right lateral foot and the left medial malleolus. We have been using silver collagen. He has home health. 10/24/17; patient has open wounds over the right second toe, now the left fourth toe and a superficial area over his left buttock. Some of the other areas have healed including the left medial malleolus and right lateral foot. Using silver collagen on these  wounds 11/07/17; the patient has closed his wounds on the right second toe and left fourth toe although they look all normal. Last time he was here he had a superficial area over the left buttock this is also closed. The right lateral foot wound I had closed out last time although it is seems to have reopened today. 628/19 the areas over the right second and left fourth toe remains closed. The right lateral foot has tightly adherent necrotic surface over. We have been using silver collagen he offloads this and a bunny boot 12/12/17; the patient has reopened the recurrent area over the right second PIP joint. 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 He does not have obvious macrovascular disease. The area over the right lateral foot looks about the same as last time tightly adherent necrotic debris. We've been using Hydrofera Blue 12/25/17; 2 week follow-up. The area over the right second PIP is closed however the area on the right lateral foot is worse with surrounding erythema and pain compatible with cellulitis is going to need an antibiotic. AS WELL..... We have looked at his buttocks and lower back. He has 3 stage II ulcers. The exact history here is unclear however he is very disabled man with cerebral palsy. He goes to a day program from the group home in which she lives 5 days a week and I think he is on his buttock all day on these visits. Surrounding the wounds see has extensive stage I injury as well. FINALLY it looks as though he has lost weight. I'm not the only  staff member that is recognized this 01/08/18 on evaluation today patient appears to be doing better in regard to his gluteal wounds. In fact everything appears to be healed back here although very dry. He has a couple open sores on the scrotal region other than this it is really his foot that is still giving him the most trouble. He did complete the Keflex unfortunately it seems like there's still a lot of erythema in this in fact looks worse than it did previous. I'm gonna culture this today and likely start with a different antibiotic. 01/16/18; I was reassured that the wounds on his buttocks and sacrum are healed. I did not look at these the day. The culture that was done last week on 01/11/18 of the right lateral foot wounds showed Pseudomonas. Bactrim is not a good choice for this. Ciprofloxacin interacts with his muscle relaxants therefore I prescribed cefdinir 300 twice a day for 7 days. Bactrim coverage of pseudomonas is not reliable although the degree of erythema looks a lot better 01/30/18; the patient has completed his antibiotics. The area does not have infection on the right lateral foot. The wounds look a lot better. We've been using silver alginate 02/12/18; there is on the right lateral foot. Copious amounts of surface eschar. Also similarly over the PIP of the right second toe. We've been using silver alginate to date 03/02/2018; the area that remains is on the right lateral foot. The right second toe is healed. 03/16/2018; the area there is original wound on the right lateral foot. Still requiring debridement. We are using Hydrofera Blue. The right second toe is still healed/closed 03/30/2018; right lateral foot there is 2 small open areas remaining. They actually look quite healthy we have been using Hydrofera Blue the right second toe remains closed 04/13/2018; right lateral foot. 1 of the 2 small open  areas from last time has closed over. The other had surface slough that I removed  with a #3 curette. This looks quite healthy. The right second toe remains closed. We are using Hydrofera Blue 05/01/2018; right lateral foot very superficial areas that look like they are trying to close over. He had a new threatened area on the left medial ankle just below the medial malleolus. I think these represent chronic venous insufficiency areas 05/14/2018; the right right lateral foot looks like it closed over as was predicted last time however he has some dark areas subcutaneously erythema superiorly and this is really very tender. This is either coexistent cellulitis or perhaps a deep tissue injury. The area on the left medial ankle is superficial but has some size. We have been using Hydrofera Blue to both areas. The patient complains of pain in his right foot 06/09/2018; right lateral foot was closed over last time but there was coexistent cellulitis around this. I gave him Levaquin. He also has an area on the left medial malleolus. He has not been back to clinic in over 3-1/2 weeks. We are using Hydrofera Blue I think to the left medial malleolus and silver alginate to the right lateral foot 1/21; right lateral foot still non-viable debris over this area and the left medial malleolus. Both of these vigorously washed off with antiseptic gauze. We have been using silver alginate. 2/4; right lateral foot this looks somewhat better. Unfortunately the area over the left medial malleolus has deteriorated. Using silver alginate in both area 2/18; both wounds arrived today covered in a lot of necrotic debris. We have been using silver alginate. Clearly not making a lot of progress. 3/3; the patient's right second toe is reopened over the PIP. I am not sure why this is happening. He may have microvascular disease. We have checked his macrovascular flow in the past and found it to be normal. He may need an x-ray. The area on the right lateral foot requires debridement. The area on the left lateral  ankle is worse. We have been using PolyMem Ag 08/26/18 on evaluation today patient appears to be doing a little worse in regard to his lower extremities based on what I'm seeing what the nursing staff is telling me. His left medial ankle appears potential to be infected there is your thing on want surrounding the wound. His right second toe is also of concern at this point in my pinion. We have not done x-rays of these areas that may be a good idea to go ahead and proceed with at this point to ensure will not deal with anything such as osteomyelitis. 4/16; it is been a while since I saw this patient. He now has wounds on the right lateral foot, dorsal right second toe, substantially on the left medial malleolus and a small area on the left lateral foot. We have been using PolyMem Silver. Curlex and Coban. He has home health changing the dressing. Since last time he was here he had a culture of the left medial ankle that showed MRSA. He is completed a course/7 days of doxycycline. XRAYS of the left ankle and right foot that were ordered were negative for osteomyelitis 4/30; he arrives in clinic today with some maceration around the wound and a lot of tenderness and erythema in a circumferential area around the right lateral foot. He has wound areas on the right lateral foot dorsal right second toe, largely over the left medial malleolus and a small area over  the left lateral foot 5/7; completed his antibiotics from last time right lateral foot cellulitis is better and the area on his left knee which we discovered after he had been seen by the provider also is improved. He still has extensive wound areas over the right lateral foot, left medial malleolus, right second toe and left lateral foot although all of these look somewhat better with PolyMem 5/15. Wounds on the right lateral foot, right second toe left medial malleolus and the left lateral foot. All of these look somewhat better. We have been  using PolyMem Ag 5/26; the wound on the right lateral foot is almost closed over but he still complains of a lot of pain in this area. He has a eschared area on the right second toe but no open wound.. Also concerning today his original wound on the left medial malleolus looks healthy however just inferior to this almost areas of deep tissue injury and there is an area on the left lateral foot also looks like a deep tissue injury. In the past I have wondered whether he might have cholesterol emboli or some other form of a vasculopathy. I wonder whether this could have something to do with this. 6/2; right lateral foot wound is still open I gave him empiric antibiotics last week without really a lot of change she is still complaining of pain in this area. We x- rayed his feet at some point in April these were negative. It would be difficult to do an MRI. The area on his left medial malleolus looks better. Right second toe is healed. He still has what looks to be a small pressure related area on the left lateral foot but it is not open. We are using PolyMem to all wound which seems to be doing nicely 6/16; right lateral foot wound is still open. The wound looks better although there is still a lot of tenderness around it. Rather than more empiric antibiotics I have cultured this today. We are using PolyMem Ag. On the left lateral malleolus the wound appears better. He has a new area on the left lower anterior pretibial area 6/29; 2-week follow-up. Culture I did of the right lateral foot 2 weeks ago showed MRSA. I gave him 2 weeks of Septra which he should be just about finishing now and this seems better. We got a call from home health last week to report swelling in the left leg because we were listed as his primary. We referred him back to primary care. He arrives today with intense erythema and tenderness around the 2 wounds on the left medial ankle on the left lower calf. The area on the right  lateral foot looks better 7/7; I brought him back this week to follow-up on his left foot which had cellulitis last week. I gave him clindamycin which as far as I know he tolerated. The erythema is better. He has a 3 current wounds one on the right lateral foot a small area on the right lateral malleolus and the more recent area on the left anterior pretibial area. 7/21; bilateral distal lower leg and foot wounds. The area on his right lateral foot looks a lot better. He has an area on his left pretibial area distally. The area on the medial malleolus on the left which is 1 of his worst wounds has closed over. He does not appear to have any open areas in his remaining toes 8/4-Patient returns at 2 weeks, he has the remaining wound on  the left lower leg the other 2 wounds have healed. Patient is continued to be treated with silver alginate with a 2 layer wrap on the left 8/18; I been following this patient for a long period for bilateral lower extremity wounds. 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 His wounds are probably pressure although he religiously wears thick bunny boots. He does not have macrovascular disease but I did formal testing on him perhaps 2 or 3 years ago. I have often wondered about either cholesterol emboli or a vasculopathy of some sort given the distal nature of these variable locations etc. He has 2 areas that we have been following one on the left medial lower leg and one on the right lateral foot. Both of these are mostly healed today there is some eschar over sections of both areas although I did not disturb this. 03/31/2019 on evaluation today patient appears to be doing a little bit more poorly in regard to the sacral region. He has 2 small wounds noted at this point. Fortunately there is no signs of active infection at this time. No fevers, chills, nausea, vomiting, or diarrhea. He is having some pain when he sits on  his gluteal region a big part of the reason he has these wounds is likely due to the fact that his wheelchair has not been functioning properly. He does still have a couple areas of deep tissue injury on his lower extremities he does need new Prevalon offloading boots as well. 04/28/2019 on evaluation today patient appears to be doing well with regard to his wounds in particular. His ankle area has fairly small based on what I am seeing currently and his sacral region actually is still open but does not appear to be doing too poorly in my opinion. Fortunately there is no signs of active infection at this time. He was in the hospital unfortunately and this was from 04/08/2019 through 04/12/2019 secondary to persistent diarrhea, dehydration and acute kidney injury. With that being said fortunately the patient does not seem to be doing too poorly at this time which is good news. He is having pain mainly in the sacral region. 06/23/2019 on evaluation today patient appears to be doing well with regard to his sacral region which in fact is completely healed. Unfortunately he has an area of rubbing/pressure on his right lateral chest/back region. This is where it is rubbing up on his chair. Subsequently he also has an open wound on the left medial malleolus which is not very open but starting to crack and then on the right lateral foot this is much more open at this point. Unfortunately there is no signs of significant improvement in regard to these areas and in fact there does appear to be evidence of active infection quite significantly. No fevers, chills, nausea, vomiting, or diarrhea. The patient did test positive for COVID-19 that has been greater than 14 days ago although they did not know the exact timing. That is why he was not here for his last appointment. 07/07/2019 upon evaluation today patient actually appears to be doing quite well in regard to his left ankle as well as the chest/back region which  is doing great in fact appears to be healed in my opinion. He still has an issue on his lateral right foot that is open and appears to be infected but does have me more concerned. In fact I do believe the doxycycline helped in general for the  cellulitis but I believe that may have been more MRSA related based on the culture that we did. What I am seeing right now has more of the blue-green drainage and may be more consistent with a Pseudomonas that I was hoping was not really causing much of an issue but I think it may be at this point on the right lateral foot. For that reason I do think treatment would be a good idea of the reason I avoided the Cipro before was the potential for QT prolongation which could obviously cause problems with the patient with results of interaction with respiratory wound. The tizanidine also had some interactions as well. Nonetheless I think at this point that it would be a possibility for [place] to use topical gentamicin to see if this could be of benefit for the patient underneath the silver alginate dressing. 07/21/2019 upon evaluation today patient appears to be doing fairly well at this point in regard to his lower extremity on the right. He does have a wound on his right lateral foot as well as the right fifth toe and the right fourth toe. Unfortunately he continues to experience significant issues with pressure and trauma to some degree and I think this is just due to the fact that again he is not really not able to control his legs and what is happening here. Fortunately there is no signs of active infection at this time. 08/04/2019 upon evaluation today patient actually appears to be doing quite well with regard to his toe ulcers I am very pleased in that regard. Unfortunately the foot ulcer is showing some signs of erythema his primary care provider has placed him on clindamycin at this point. Again I do believe this can be beneficial for him. With that being said I do  think the patient may benefit from a light Kerlix and Coban wrap to help with some of his edema at this point. 08/18/2019 upon evaluation today patient appears to be doing still poorly in regard to his lateral foot ulcer. This is not very deep but there is some concern about the possibility of osteomyelitis. I think it would be appropriate for [place] to go ahead and have an x-ray at this time. The patient is currently on clindamycin which from his culture which was performed on 06/23/2019 it does appear that he would benefit from that in regard to the MRSA. However Pseudomonas was also identified then and the patient actually has some blue-green drainage at this point as well. I am much more concerned about the possibility of the Pseudomonas being an infection is causative agent at this point that I am the MRSA. Subsequently Cipro the patient was sensitive to but we never use that is stable use a topical gent due to the fact that unfortunately there was some interaction between the Cipro and the patient's risk for down potentially causing QT prolongation. 08/31/2019; have not seen this patient in quite a period of time. He has a substantial wound on his right lateral foot we have been using gentamicin ointment with Hydrofera Blue over the top. From what I am able to determine he is also on antibiotics perhaps clindamycin although I have not had a chance to look over this. He also has an eschared area on the right fifth toe as well as the right second toe. 4/20; right lateral foot wound. He comes in today with intense erythema and tenderness around this wound. The wound itself has a surface on it but it doesn't  look particularly healthy a lot of gelatinous debris. There is no palpable bone. We've been using gentamicin ointment and Hydrofera Blue. Since the patient was last here he was seen by Dr. Algis Liming of infectious disease. He ordered an MRI that I think is due later this month on 4/30. He apparently also  checked a sedimentation rate and C-reactive protein metabolic panel CBC with differential. At the time he was seen he wanted to withhold any microbials pending identification of the depth of the infection although looking at this I don't know that I'm going to be able to hold off for another 10 days. 4/29; he went on to have the MRI that was ordered by Dr. Algis Liming of infectious disease. 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 I had given him cefdinir last week for surrounding cellulitis he is finishing this today. I have communicated with Dr. Algis Liming who wants bone for pathology and culture before proceeding with consideration of IV antibiotics. Fortunately he is due to see Dr. Samuella Cota of podiatry today I am hopeful that the bone biopsy and culture will be considered there 5/4 as I understand things Dr. Samuella Cota actually wants to remove the diseased proximal fifth metatarsal. Looking back at this decision is probably not a bad one. He has good blood flow he is nonambulatory they are apparently going to do this next week. Paradoxically the wound on the lateral part of his foot is actually a lot better. The cellulitis that was so problematic 2 weeks ago also has resolved. READMISSION 03/23/2020 T is now a 61 year old man he has cerebral palsy and is functionally paraplegic. The last time I saw him he had a diseased proximal fifth metatarsal. 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 Samuella Cota. Culture of this showed Pseudomonas and he was followed by infectious disease Dr. Algis Liming treated with ciprofloxacin. 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 He had Integra placed. A CandS showed Pseudomonas again. Since then he has had Silvadene as a primary dressing, then Santyl and more recently Xeroform and then back to Santyl 3 times a  week per his attendant who comes with him. Reviewing his records I was really not prepared for the number of wounds that he currently has. This includes substantial areas on the right lateral foot, right lateral ankle, right fourth toe dorsally, right ankle anteriorly as well as the left lateral ankle and foot. His medical history is unchanged ABIs 1.2 on the right and 1.21 on the left he has not been known to have arterial insufficiency and is not a diabetic 11/5; patient I readmitted to the clinic last week. He has multiple wounds on the right lateral foot, right fourth toe and a new area on the right second toe. He has areas on the right dorsal ankle and a substantive wound over the left lateral ankle and foot. We are using silver collagen. We put him under compression last week and this seems to have helped. He has home health coming out one other time per week to change the dressing 11/12; no real improvement. Multiple bilateral foot and ankle wounds. On the left he has a substantial wound on the left lateral ankle On the right he has wounds on the dorsal second and fourth toes. Right lateral foot we have merged to wounds these are necrotic She  has an area on the right dorsal ankle and a area on the right medial malleolus with marked surrounding erythema likely cellulitis. The patient complains about pain that kept him awake all last night from wraps that were too tight. He is not known to have arterial insufficiency. 11/18; no real change. I gave him empiric Levaquin last week directed at previously cultured Pseudomonas this is not really helped. On the right he has wounds on the right lateral foot right lateral ankle right medial ankle and the dorsal second and fourth toes. Most of these do not have a nice have a healthy surface. There is erythema in his foot which may all be venous stasis. The area on the left lateral malleolus/ankle area looks better 12/3; really disappointing if anything  deterioration especially on the right lateral foot. These are necrotic. He also has areas on the right dorsal ankle right medial malleolus and the right second and fourth toes. On the left he has a large area over the left medial ankle. There is some erythema around the wound. Things are very tender I have reviewed his most recent arterial Dopplers which are actually in 2019 but he has never been felt to have arterial issues. At that time his ABI on the right was 1.16 [no TBI due to amputation] on the left his ABI was 1.20 with a great toe pressure of 0.24. Waveforms on the right and left toes were all normal he was not felt to have significant evidence of lower extremity arterial disease. ABIs in our clinic have always been normal his pulses have been palpable. He had an MRI of his right foot in April 2021 this showed a soft tissue ulceration along the lateral midfoot with underlying osteomyelitis no abscess. 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 He returned to our clinic on 03/23/2020. The same pattern of wounds that I am describing currently this was a lot worse than earlier in the year. I gave him empiric antibiotics because he had had Pseudomonas in the past this does not seem to have helped. We have not made any progress on any of these wound site. The patient is in a lot of pain 12/10; the patient comes in with a new deep tissue injury on the right heel large necrotic wound on the right lateral foot, smaller necrotic wound on the dorsal ankle and medial ankle. Necrotic wounds on the second and fourth toes. We have been using Hydrofera Blue On the left he has the area on the left little medial ankle which actually looks some better healthier looking tissue. We are using Hydrofera Blue here as well X-ray ordered last week showed no osseous erosion no plain film indication of osteomyelitis. 12/17; PCR culture I did of the right lateral foot wound last week showed a large titer of  Enterococcus faecalis and low titers of of coag negative staph and Peptostreptococcus. I have started him on Augmentin for 2 weeks but I may continue beyond this. He has continuous complaints of pain now apparently making it difficult for him to rest at night. Substantial wound area in the right foot and ankle. Unfortunately the left medial foot seems to be more substantial this week. 12/27; in general the wounds on the right foot and ankle and the left medial ankle look better. There is certainly less erythema in the right foot. I have him on Augmentin and I am going to extend this for another week. MRI is booked for 06/01/2020 06/02/2020; patient's  extensive skin breakdown on the right foot continues it is stable to perhaps slightly improved however there is a considerable amount of tissue breakdown. Unfortunately his MRI shows findings consistent with osteomyelitis in the lateral aspect of the cuboid. The overlying wound in this area is deep but less necrotic than I am used to seeing. He has area on the right posterior heel is now necrotic.. The area on the left lateral ankle/foot looks somewhat better. The patient states he continues to be in a lot of pain from the right foot and he is really tired of hurting from these wounds. I have discussed things with T before. He is anxious to be out of pain. He is nonambulatory. Furthermore the osteomyelitis in the right foot and the erry extensive wounds in this area are only a source of potential systemic infection. He seems in favor of proceeding with a below-knee amputation. I cannot argue with this we have not made a lot of progress in several months working on these wounds ever since his fifth ray amputation by Dr. Samuella Cota 1/28; in general the patient's right foot looks better although there is several wounds still present there were certainly look less angry. The area on the right lateral foot looks better he still has a necrotic area on the right Achilles  heel but this is separating the right fourth toe and the right medial malleolus. We have been using silver alginate with Bactroban on all of these wounds. On the left medial ankle the wound looks healthy we have been using Hydrofera Blue. He has an appointment I think on Monday with Dr. Victorino Dike with regards to a BKA. Although the wounds on the right foot look better than they did heat the patient still relates that he has occasional pain and he suffered with this through the years he would rather have a below-knee amputation. Provided Dr. Victorino Dike agrees with this he is anxious to proceed 2/11; the patient continues to make decent progress with the area on the left medial ankle. We are using Hydrofera Blue in this area. Although retreating the right foot wounds palliatively he continues to make improvement in the right lateral dorsal foot, right heel and the wounds on his toes. He saw Dr. Victorino Dike to also concurred with the amputation directed has underlying osteomyelitis. The patient has suffered a great deal with the pain in this foot from recurrent wounds although the wounds currently are making progress I do not think this decision is the wrong one. He is nonambulatory and has underlying osteomyelitis 2/25; the left medial ankle wound is much better. We have been using Hydrofera Blue on this home health changing the dressing Amputation with Dr. Victorino Dike on 07/27/2020. The wounds on his lateral foot are necrotic. The rest of the multiple wounds look the same. Electronic Signature(s) Signed: 07/21/2020 4:32:00 PM By: Baltazar Najjar MD Entered By: Baltazar Najjar on 07/21/2020 13:06:16 -------------------------------------------------------------------------------- Physical Exam Details Patient Name: Date of Service: NAHUN, KRONBERG 07/21/2020 10:45 A M Medical Record Number: [id number] Patient Account Number: [id number] Date of Birth/Sex: Treating RN: [birth year] (60 y.o. [name] Primary Care  Provider: [name] Other Clinician: Referring Provider: Treating Provider/Extender: [name], [name] in Treatment: 17 Notes Wound exam The left medial foot is a lot smaller the area on the right lateral is necrotic once again after having looks some better the last time he was here. The area on the right medial malleolus is about the same. Right heel also about the  same. Electronic Signature(s) Signed: 07/21/2020 4:32:00 PM By: Baltazar Najjar MD Entered By: Baltazar Najjar on 07/21/2020 13:06:59 -------------------------------------------------------------------------------- Physician Orders Details Patient Name: Date of Service: LIBRADO, GUANDIQUE 07/21/2020 10:45 A M Medical Record Number: [id number] Patient Account Number: [id number] Date of Birth/Sex: Treating RN: 20-May-1961 (60 y.o. 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 Bathing/ Shower/ Hygiene May shower with protection but do not get wound dressing(s) wet. Other Bathing/Shower/Hygiene Orders/Instructions: - scrub legs gently with wash cloth and soap and water with dressing changes Edema Control - Lymphedema / SCD / Other Bilateral Lower Extremities Elevate legs to the level of the heart or  above for 30 minutes daily and/or when sitting, a frequency of: Off-Loading Heel suspension boot to: - heel protectors at all times Other: - avoid any pressure to back of heels Home Health No change in wound care orders this week; continue Home Health for wound care. May utilize formulary equivalent dressing for wound treatment orders unless otherwise specified. Other Home Health Orders/Instructions: - [facility] Wound Treatment Wound #41 - T Fourth oe Wound Laterality: Right Cleanser: Soap and Water (Home Health) 2 x Per Week/30 Days Discharge Instructions: May shower and wash wound with dial antibacterial soap and water prior to dressing change. Cleanser: Wound Cleanser (Home Health) 2 x Per Week/30 Days Discharge Instructions: Cleanse the wound with wound cleanser prior to applying a clean dressing using gauze sponges, not tissue or cotton balls. Peri-Wound Care: Sween Lotion (Moisturizing lotion) 2 x Per Week/30 Days Discharge Instructions: Apply moisturizing lotion with dressing changes Topical: Bactroban (Home Health) 2 x Per Week/30 Days Discharge Instructions: apply to wound bed under alginate Prim Dressing: KerraCel Ag Gelling Fiber Dressing, 4x5 in (silver alginate) (Home Health) 2 x Per Week/30 Days ary Discharge Instructions: Apply silver alginate to wound bed as instructed Secondary Dressing: Woven Gauze Sponge, Non-Sterile 4x4 in (Home Health) 2 x Per Week/30 Days Discharge Instructions: Apply over primary dressing as directed. Secondary Dressing: ABD Pad, 5x9 (Home Health) 2 x Per Week/30 Days Discharge Instructions: Apply over primary dressing as directed. Compression Wrap: Kerlix Roll 4.5x3.1 (in/yd) (Home Health) 2 x Per Week/30 Days Discharge Instructions: Apply Kerlix and Coban compression as directed. Compression Wrap: Coban Self-Adherent Wrap 4x5 (in/yd) (Home Health) 2 x Per Week/30 Days Discharge Instructions: Apply over Kerlix as directed. Wound #43 - Lower Leg  Wound Laterality: Right, Anterior, Distal Cleanser: Soap and Water (Home Health) 2 x Per Week/30 Days Discharge Instructions: May shower and wash wound with dial antibacterial soap and water prior to dressing change. Cleanser: Wound Cleanser (Home Health) 2 x Per Week/30 Days Discharge Instructions: Cleanse the wound with wound cleanser prior to applying a clean dressing using gauze sponges, not tissue or cotton balls. Peri-Wound Care: Sween Lotion (Moisturizing lotion) 2 x Per Week/30 Days Discharge Instructions: Apply moisturizing lotion with dressing changes Topical: Bactroban (Home Health) 2 x Per Week/30 Days Discharge Instructions: apply to wound bed under alginate Prim Dressing:  KerraCel Ag Gelling Fiber Dressing, 4x5 in (silver alginate) (Home Health) 2 x Per Week/30 Days ary Discharge Instructions: Apply silver alginate to wound bed as instructed Secondary Dressing: Woven Gauze Sponge, Non-Sterile 4x4 in (Home Health) 2 x Per Week/30 Days Discharge Instructions: Apply over primary dressing as directed. Secondary Dressing: ABD Pad, 5x9 (Home Health) 2 x Per Week/30 Days Discharge Instructions: Apply over primary dressing as directed. Compression Wrap: Kerlix Roll 4.5x3.1 (in/yd) (Home Health) 2 x Per Week/30 Days Discharge Instructions: Apply Kerlix and Coban compression as directed. Compression Wrap: Coban Self-Adherent Wrap 4x5 (in/yd) (Home Health) 2 x Per Week/30 Days Discharge Instructions: Apply over Kerlix as directed. Wound #44 - Foot Wound Laterality: Right, Lateral, Proximal Cleanser: Soap and Water (Home Health) 2 x Per Week/30 Days Discharge Instructions: May shower and wash wound with dial antibacterial soap and water prior to dressing change. Cleanser: Wound Cleanser (Home Health) 2 x Per Week/30 Days Discharge Instructions: Cleanse the wound with wound cleanser prior to applying a clean dressing using gauze sponges, not tissue or cotton balls. Peri-Wound Care: Sween  Lotion (Moisturizing lotion) 2 x Per Week/30 Days Discharge Instructions: Apply moisturizing lotion with dressing changes Topical: Bactroban (Home Health) 2 x Per Week/30 Days Discharge Instructions: apply to wound bed under alginate Prim Dressing: KerraCel Ag Gelling Fiber Dressing, 4x5 in (silver alginate) (Home Health) 2 x Per Week/30 Days ary Discharge Instructions: Apply silver alginate to wound bed as instructed Secondary Dressing: Woven Gauze Sponge, Non-Sterile 4x4 in (Home Health) 2 x Per Week/30 Days Discharge Instructions: Apply over primary dressing as directed. Secondary Dressing: ABD Pad, 5x9 (Home Health) 2 x Per Week/30 Days Discharge Instructions: Apply over primary dressing as directed. Compression Wrap: Kerlix Roll 4.5x3.1 (in/yd) (Home Health) 2 x Per Week/30 Days Discharge Instructions: Apply Kerlix and Coban compression as directed. Compression Wrap: Coban Self-Adherent Wrap 4x5 (in/yd) (Home Health) 2 x Per Week/30 Days Discharge Instructions: Apply over Kerlix as directed. Wound #47 - Malleolus Wound Laterality: Left, Medial Cleanser: Soap and Water (Home Health) 2 x Per Week/30 Days Discharge Instructions: May shower and wash wound with dial antibacterial soap and water prior to dressing change. Cleanser: Wound Cleanser (Home Health) 2 x Per Week/30 Days Discharge Instructions: Cleanse the wound with wound cleanser prior to applying a clean dressing using gauze sponges, not tissue or cotton balls. Peri-Wound Care: Sween Lotion (Moisturizing lotion) (Home Health) 2 x Per Week/30 Days Discharge Instructions: Apply moisturizing lotion with dressing changes Prim Dressing: Hydrofera Blue Classic Foam, 4x4 in (Home Health) 2 x Per Week/30 Days ary Discharge Instructions: Apply to wound bed as instructed Secondary Dressing: Woven Gauze Sponge, Non-Sterile 4x4 in 2 x Per Week/30 Days Discharge Instructions: Apply over primary dressing as directed. Compression Wrap: Kerlix  Roll 4.5x3.1 (in/yd) (Home Health) 2 x Per Week/30 Days Discharge Instructions: Apply Kerlix and Coban compression , be sure to wrap from base of toes to tibial tuberosity. no too tight Compression Wrap: Coban Self-Adherent Wrap 4x5 (in/yd) (Home Health) 2 x Per Week/30 Days Discharge Instructions: Apply over Kerlix as directed. Wound #48 - Calcaneus Wound Laterality: Right Cleanser: Soap and Water (Home Health) 2 x Per Week/30 Days Discharge Instructions: May shower and wash wound with dial antibacterial soap and water prior to dressing change. Cleanser: Wound Cleanser (Home Health) 2 x Per Week/30 Days Discharge Instructions: Cleanse the wound with wound cleanser prior to applying a clean dressing using gauze sponges, not tissue or cotton balls. Peri-Wound Care: Sween Lotion (Moisturizing lotion) 2 x  Per Week/30 Days Discharge Instructions: Apply moisturizing lotion with dressing changes Topical: Bactroban (Home Health) 2 x Per Week/30 Days Discharge Instructions: apply to wound bed under alginate Prim Dressing: KerraCel Ag Gelling Fiber Dressing, 4x5 in (silver alginate) (Home Health) 2 x Per Week/30 Days ary Discharge Instructions: Apply silver alginate to wound bed as instructed Secondary Dressing: Woven Gauze Sponge, Non-Sterile 4x4 in (Home Health) 2 x Per Week/30 Days Discharge Instructions: Apply over primary dressing as directed. Secondary Dressing: ABD Pad, 5x9 (Home Health) 2 x Per Week/30 Days Discharge Instructions: Apply over primary dressing as directed. Secondary Dressing: ALLEVYN Heel 4 1/2in x 5 1/2in / 10.5cm x 13.5cm (Home Health) 2 x Per Week/30 Days Discharge Instructions: Apply over primary dressing as directed. Compression Wrap: Kerlix Roll 4.5x3.1 (in/yd) (Home Health) 2 x Per Week/30 Days Discharge Instructions: Apply Kerlix and Coban compression as directed. Compression Wrap: Coban Self-Adherent Wrap 4x5 (in/yd) (Home Health) 2 x Per Week/30 Days Discharge  Instructions: Apply over Kerlix as directed. Electronic Signature(s) Signed: 07/21/2020 4:32:00 PM By: Baltazar Najjar MD Signed: 07/21/2020 4:54:48 PM By: Zenaida Deed RN, BSN Entered By: Zenaida Deed on 07/21/2020 12:26:24 -------------------------------------------------------------------------------- Problem List Details Patient Name: Date of Service: KOURY, RODDY 07/21/2020 10:45 A M Medical Record Number: [id number] Patient Account Number: [id number] Date of Birth/Sex: Treating RN: 26-Feb-1961 (59 y.o. [name] Primary Care Provider: [name] Other Clinician: Referring Provider: Treating Provider/Extender: [name], [name] in Treatment: 17 Active Problems ICD-10 Encounter Code Description Active Date MDM Diagnosis T81.31XD Disruption of external operation (surgical) wound, not elsewhere classified, 03/23/2020 No Yes subsequent encounter S90.811D Abrasion, right foot, subsequent encounter 03/23/2020 No Yes L97.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 layer exposed 03/23/2020 No Yes L97.312 Non-pressure chronic ulcer of right ankle with fat layer exposed 03/23/2020 No Yes L97.322 Non-pressure chronic ulcer of left ankle with fat layer exposed 03/23/2020 No Yes L89.616 Pressure-induced deep tissue damage of right heel 05/05/2020 No Yes Inactive Problems ICD-10 Code Description Active Date Inactive Date I89.0 Lymphedema, not elsewhere classified 03/23/2020 03/23/2020 L03.115 Cellulitis of right lower limb 04/07/2020 04/07/2020 Resolved Problems Electronic Signature(s) Signed: 07/21/2020 4:32:00 PM By: Baltazar Najjar MD Entered By: Baltazar Najjar on 07/21/2020 13:04:51 -------------------------------------------------------------------------------- Progress Note Details Patient Name: Date of Service: [name] 07/21/2020 10:45 A M Medical Record Number: [id number] Patient Account Number:  [id number] Date of Birth/Sex: Treating RN: 1960-08-25 (60 y.o. [name] Primary Care Provider: [name] Other Clinician: Referring Provider: Treating Provider/Extender: [name], [name] in Treatment: 17 Subjective History of Present Illness (HPI) 11/23/15; this is a patient I remember from a previous stay in this clinic in 2012. The patient says this was for a wound in this same area as currently although I have no information to verify that. Most of the information is provided by his caregiver from the group home where he resides. Apparently he has had a wound in this area for 8 months. He also has erythema around this area and has had multiple rounds of antibiotics apparently with some improvement but the erythema is persists. He apparently had a fracture in the ankle 2 months ago and was seen by Dr. Lajoyce Corners . He apparently had a splint applied and more recently a heel offloading boot. He had home health coming out to a week ago not clear what they are dressing this with but they apparently discharged him perhaps because of one of Dr. Audrie Lia socks The patient has  cerebral palsy and has a functional quadriparetic. He has a Nurse, adult for transferring at home he is nonambulatory and does not wear footwear. He is not a diabetic. Looking through [facility] he appears to have fairly active primary care. The area on the ankle is variably labeled as a pressure area and/or chronic venous insufficiency. He had a recent venous ultrasound on 5/25 that did not show a DVT in the left leg however this was not a reflux study. There was no superficial DVT either. Arterial studies on 10/15/14 showed a left ABI of 1.16 Center right of 1.12 waveforms were triphasic he does not have significant arterial disease. He has had recent x-rays of the left foot and ankle. The left foot did not show any abnormality. Left ankle x-ray showed a spiral fracture of the left tibial  diaphysis without evidence of osteomyelitis. 11/30/15 culture I did last week showed pseudomonas I changed him to oral ciprofloxacin. Erythema is a lot better. 12/07/15 area around the wound shows considerable improvement of the periwound erythema. 12/14/15; the patient has a improvement of the periwound erythema which in retrospect I think with cellulitis successfully treated. We have been using Iodoflex started last week 12/25/15 wound on the left medial malleolus and dorsal left foot. Theraskin #1 applied READMISSION 02/27/16; the patient was admitted to hospital from 8/14 through 01/18/16 initially felt to have cellulitis of his left lower leg however he was noted to have significant persistent pain and swelling. A duplex ultrasound was ordered that was negative. Obie Dredge an x-ray was done that showed a spiral fracture of the left leg. With posterior displacement and angulation at the mid left tibia. Orthopedics saw the patient and splinted the leg. He was then sent to [facility] skilled facility and only return to his group home last week. He has wounds on the left medial malleolus, left dorsal foot and a worrisome area on the plantar left heel which is not really open but may represent a hematoma or a DTI 03/06/16; areas of left anterior and medial ankle both look improved. There are 3 small wounds here. He has a area over the dorsal left first metatarsal head which is not open. In meantime his left heel has opened 03/14/16 the medial left ankle wounds looks stable to improved. The area on the posterior left heel has opened up and had a very adherent eschar and slept over this 03/21/16 patient has 3 wounds on his left medial ankle is stable area on his left dorsal first metatarsal head appears to be improved. Continued problems with adherent eschar over the left heel 03/28/16; patient has 3 wounds on his left medial ankle and lower leg all of these appear to be stable. The problem continues to be the  left heel and the adherent eschar. This may have been a pressure ulcer from her brace at one point. He has a English as a second language teacher we have been using [employer] and all wound areas. Dressings are being changed by home health 04/04/16; the patient's areas on the medial aspect of the left ankle looks superficial and appear to be gradually closing. The area on the plantar aspects/Achilles aspect of his left heel as surface slough and nonviable subcutaneous tissue requiring debridement. 04/11/16; the areas on the patient's medial left ankle appear to be superficial and appear to be closing. The area on the plantar heel on the left has surface slough and nonviable subcutaneous tissue requiring debridement. I don't think this is too much different from last  week there is also undermining 04/25/16; patient is followed in his group home by [facility] home health. The area on the plantar heel is smaller but with still with some depth. We're using Iodoflex to this. The areas on the left ankle looks healthy but dry and somewhat larger. Cause of this is not really clear using Prisma 05/02/16; areas x3 all look improved expecially the areas on the medial ankle. Heel wound is smaller but still deep 05/09/16; all areas o3 look improved on the medial ankle. Heel wound is also contracting. 12/0.9/17 the areas on his left medial ankle are all closed. I think these are largely chronic venous insufficiency however there is no edema. The area on his left heel was a pressure area. READMISSION 08/02/16; this man is a patient that we have had in this clinic on several times before. I believe he has cerebral palsy is nonambulatory. He has chronic venous insufficiency with venous inflammation. He was last year in the fall of 2017 with areas on his left medial ankle left heel and left dorsal foot. He arrives today with once again a vague history. As mentioned he is nonambulatory he spends most of his day in a pressure offloading boot  nevertheless they noted wounds in this area on the medial left ankle 2 weeks ago. There is any other history here I am uncertain 08/15/16; the patient's wounds on his medial ankle actually look better however the area on the left lateral/dorsal foot is covered in black necrotic eschar. Previous formal arterial studies did not show significant arterial disease with normal ABIs and triphasic waveforms. He is immobilized and at risk for pressure although he seems aware protective boots at all times as far as I am aware. 08/22/16; in general his wounds look better. All the wounds on his medial left ankle look improved the area on his lateral foot however continues to be problematic. Patient comes in the clinic today in a new wheelchair he is quite delighted 09/05/16; a wound on the left medial Calf and left dorsal foot are healed. The area on the left lateral foot is improved however the area on the left medial malleolus is inflamed with a considerable degree of surrounding erythema. This looks like cellulitis. ABIs and clinical exam are within normal limits 09/12/16; the patient continues to have a wound on the left medial calf. I was concerned about cellulitis in this area last week and gave him a course of doxycycline. The wound looks better here. The area on the left lateral foot however is unchanged 09/20/16; the patient came in this week early after traumatizing his toe on the left foot on a table while moving around in his motorized wheelchair. 09/26/16- he is here for follow-up evaluation of his left foot and malleolus ulcers. Since his appointment he abraded his left medial hallux against a table while in his motorized wheelchair, apparently trying to pull up to the table for a meal. He continues to complain of tenderness to the left lateral foot, surrounding the current ulcer 10/07/16; left foot and malleolus ulcers. 10/14/16; the patient is doing well. He has a wound on the left lateral, left medial  malleolus and atraumatic wound on the left great toe. Although these appear to be superficial and closing 10/31/16; the patient is doing well. The area on the left lateral foot, left medial malleolus and the left great toe or all epithelialized. He has home health 11/14/16; patient arrives today with the area on the left great toe healed.  The left lateral foot however in the left medial malleolus and the ankle superficial wounds that are slightly larger. He has erythema and tenderness around the area on the left lateral foot. As well he is complaining of dysuria. His caregiver notes that when he gets like this he is more lethargic which indeed seems to be the case today 11/21/16; left great toe is still healed. His wound on the left lateral foot appears healthy and slightly smaller also the area on the left medial ankle. This had black eschar on it which I removed. The remaining wound looks satisfactory 12/12/16; patient hasn't been here in a number of weeks. The area on the left lateral foot is healed however on his medial ankle has 2 open areas one of them covered with a necrotic eschar. He also has a new wound on the right second toe which was apparently trauma induced while he was at a movie 12/27/16; the patient has wounds on his left medial mallelus and left lateral foot that are just about closed. The area on the right second toe which was trauma from last time is still open but again everything looks a lot better here. 01/30/17; patient arrives in clinic today and is really been a long time since I've seen him. The wounds are all epithelialized on the left foot this includes his left lateral foot, left medial malleolus. Apparently was in the ER for what of I think was a fungal infection on his buttock's he was prescribed Diflucan and doxycycline I think this is already getting better. READMISSION T is a now 60 year old man who has advanced cerebral palsy and functional quadriplegia. We have had him in  this clinic multiple times before for wounds erry on his right ankle, left ankle, left dorsal foot and most recently from 08/02/16 through 01/30/17 with his left medial ankle and left foot and finally right second toe. His attendant from the group home where he lives is very knowledgeable about him. She states that they noticed breakdown in his skin in his feet sometime in January although they could not get an appointment until today in this clinic. The previous wounds have always been felt to be secondary to incidental trauma/pressure areas or possibly some degree of chronic venous insufficiency. He had normal arterial studies in may of 2016. He has definite open wounds on the right lateral foot over the base of the fifth metatarsal, the right second toe DIP, the left medial malleolus. He has concerning areas over the left lateral fifth metatarsal base, retaform purpura over the medial left ankle and perhaps the medial left first toe. 07/21/17; culture we did last week from the base of the right fifth metatarsal grew group G strep. This should've been sensitive to the doxycycline I gave him. The other major wounds are on the right second toe DIP, left medial malleolus, base of the left lateral fifth metatarsal and the medial left ankle and medial left toe. 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 We have macrovascular arterial studies scheduled for Thursday although I don't think this is what the problem is. 07/28/17; patient arrives with really no improvement. He has a wound on the right second toe, lateral right foot, right medial malleolus which was new this week, left medial malleolus, but looked like a DTI on the left great toe. His macrovascular studies are on 07/30/17 although I would be surprised if this provides all the explanation. He has small areas even on his right second and third toe  which look like splinter-type hemorrhages. The question I have is where is this coming from.  I'd like to see the vascular studies however before we pursue this. He has had a DVT in the past but is no longer on anticoagulants. I'll need to review his history 08/04/17; the patient had his ARTERIAL STUDIES this showed a right ABI of 1.16 and the left ABI of 1.20. TBI on the left at 0.84. He is at a right first toe amputation. The studies were unchanged from studies in May 2016. It was not felt that he had significant lower extremity arterial disease In the meantime we are using silver alginate to the wounds on the right second toe right lateral foot left medial malleolus. I'm going to change to Silver collagen today. 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 Working him up for this would include an echocardiogram probably a CT scan of the chest and abdomen to look at the major thoracic and abdominal aortic vessels. I am not going to involve myself in this unless there is more symptoms/signs 08/18/17; the patient arrives today with wounds looking somewhat better. The remaining areas are the right second toe dorsal, right lateral foot and left medial malleolus which only has a small open area. We've been using silver collagen 09/01/17;right second toe dorsally probes to bone today. This is a deterioration. We've been using silver collagen. Only a small open area remains on the left medial malleolus. 09/09/17; right second toe appears better. There is no probing bone. He has a small area remaining on the left medial malleolus, the right lateral foot. We have been using silver collagen 10/07/17; the patient returns to clinic today after a month hiatus. He has wounds over the right second toe, right lateral foot and the left medial malleolus. We have been using silver collagen. He has home health. 10/24/17; patient has open wounds over the right second toe, now the left fourth  toe and a superficial area over his left buttock. Some of the other areas have healed including the left medial malleolus and right lateral foot. Using silver collagen on these wounds 11/07/17; the patient has closed his wounds on the right second toe and left fourth toe although they look all normal. Last time he was here he had a superficial area over the left buttock this is also closed. The right lateral foot wound I had closed out last time although it is seems to have reopened today. 628/19 the areas over the right second and left fourth toe remains closed. The right lateral foot has tightly adherent necrotic surface over. We have been using silver collagen he offloads this and a bunny boot 12/12/17; the patient has reopened the recurrent area over the right second PIP joint. 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 He does not have obvious macrovascular disease. The area over the right lateral foot looks about the same as last time tightly adherent necrotic debris. We've been using Hydrofera Blue 12/25/17; 2 week follow-up. The area over the right second PIP is closed however the area on the right lateral foot is worse with surrounding erythema and pain compatible with cellulitis is going to need an antibiotic. ooAS WELL..... We have looked at his buttocks and lower back. He has 3 stage II ulcers. The exact history here is unclear however he is  very disabled man with cerebral palsy. He goes to a day program from the group home in which she lives 5 days a week and I think he is on his buttock all day on these visits. Surrounding the wounds see has extensive stage I injury as well. ooFINALLY it looks as though he has lost weight. I'm not the only staff member that is recognized this 01/08/18 on evaluation today patient appears to be doing better in regard to his gluteal wounds. In fact everything appears to be healed back here although  very dry. He has a couple open sores on the scrotal region other than this it is really his foot that is still giving him the most trouble. He did complete the Keflex unfortunately it seems like there's still a lot of erythema in this in fact looks worse than it did previous. I'm gonna culture this today and likely start with a different antibiotic. 01/16/18; I was reassured that the wounds on his buttocks and sacrum are healed. I did not look at these the day. The culture that was done last week on 01/11/18 of the right lateral foot wounds showed Pseudomonas. Bactrim is not a good choice for this. Ciprofloxacin interacts with his muscle relaxants therefore I prescribed cefdinir 300 twice a day for 7 days. Bactrim coverage of pseudomonas is not reliable although the degree of erythema looks a lot better 01/30/18; the patient has completed his antibiotics. The area does not have infection on the right lateral foot. The wounds look a lot better. We've been using silver alginate 02/12/18; there is on the right lateral foot. Copious amounts of surface eschar. Also similarly over the PIP of the right second toe. We've been using silver alginate to date 03/02/2018; the area that remains is on the right lateral foot. The right second toe is healed. 03/16/2018; the area there is original wound on the right lateral foot. Still requiring debridement. We are using Hydrofera Blue. The right second toe is still healed/closed 03/30/2018; right lateral foot there is 2 small open areas remaining. They actually look quite healthy we have been using Hydrofera Blue the right second toe remains closed 04/13/2018; right lateral foot. 1 of the 2 small open areas from last time has closed over. The other had surface slough that I removed with a #3 curette. This looks quite healthy. The right second toe remains closed. We are using Hydrofera Blue 05/01/2018; right lateral foot very superficial areas that look like they are trying  to close over. He had a new threatened area on the left medial ankle just below the medial malleolus. I think these represent chronic venous insufficiency areas 05/14/2018; the right right lateral foot looks like it closed over as was predicted last time however he has some dark areas subcutaneously erythema superiorly and this is really very tender. This is either coexistent cellulitis or perhaps a deep tissue injury. The area on the left medial ankle is superficial but has some size. We have been using Hydrofera Blue to both areas. The patient complains of pain in his right foot 06/09/2018; right lateral foot was closed over last time but there was coexistent cellulitis around this. I gave him Levaquin. He also has an area on the left medial malleolus. He has not been back to clinic in over 3-1/2 weeks. We are using Hydrofera Blue I think to the left medial malleolus and silver alginate to the right lateral foot 1/21; right lateral foot still non-viable debris over this area and  the left medial malleolus. Both of these vigorously washed off with antiseptic gauze. We have been using silver alginate. 2/4; right lateral foot this looks somewhat better. Unfortunately the area over the left medial malleolus has deteriorated. Using silver alginate in both area 2/18; both wounds arrived today covered in a lot of necrotic debris. We have been using silver alginate. Clearly not making a lot of progress. 3/3; the patient's right second toe is reopened over the PIP. I am not sure why this is happening. He may have microvascular disease. We have checked his macrovascular flow in the past and found it to be normal. He may need an x-ray. The area on the right lateral foot requires debridement. The area on the left lateral ankle is worse. We have been using PolyMem Ag 08/26/18 on evaluation today patient appears to be doing a little worse in regard to his lower extremities based on what I'm seeing what the nursing  staff is telling me. His left medial ankle appears potential to be infected there is your thing on want surrounding the wound. His right second toe is also of concern at this point in my pinion. We have not done x-rays of these areas that may be a good idea to go ahead and proceed with at this point to ensure will not deal with anything such as osteomyelitis. 4/16; it is been a while since I saw this patient. He now has wounds on the right lateral foot, dorsal right second toe, substantially on the left medial malleolus and a small area on the left lateral foot. We have been using PolyMem Silver. Curlex and Coban. He has home health changing the dressing. Since last time he was here he had a culture of the left medial ankle that showed MRSA. He is completed a course/7 days of doxycycline. XRAYS of the left ankle and right foot that were ordered were negative for osteomyelitis 4/30; he arrives in clinic today with some maceration around the wound and a lot of tenderness and erythema in a circumferential area around the right lateral foot. He has wound areas on the right lateral foot dorsal right second toe, largely over the left medial malleolus and a small area over the left lateral foot 5/7; completed his antibiotics from last time right lateral foot cellulitis is better and the area on his left knee which we discovered after he had been seen by the provider also is improved. He still has extensive wound areas over the right lateral foot, left medial malleolus, right second toe and left lateral foot although all of these look somewhat better with PolyMem 5/15. Wounds on the right lateral foot, right second toe left medial malleolus and the left lateral foot. All of these look somewhat better. We have been using PolyMem Ag 5/26; the wound on the right lateral foot is almost closed over but he still complains of a lot of pain in this area. He has a eschared area on the right second toe but no open  wound.[place]. ooAlso concerning today his original wound on the left medial malleolus looks healthy however just inferior to this almost areas of deep tissue injury and there is an area on the left lateral foot also looks like a deep tissue injury. In the past I have wondered whether he might have cholesterol emboli or some other form of a vasculopathy. I wonder whether this could have something to do with this. 6/2; right lateral foot wound is still open I gave him empiric  antibiotics last week without really a lot of change she is still complaining of pain in this area. We x- rayed his feet at some point in April these were negative. It would be difficult to do an MRI. The area on his left medial malleolus looks better. Right second toe is healed. He still has what looks to be a small pressure related area on the left lateral foot but it is not open. We are using PolyMem to all wound which seems to be doing nicely 6/16; right lateral foot wound is still open. The wound looks better although there is still a lot of tenderness around it. Rather than more empiric antibiotics I have cultured this today. We are using PolyMem Ag. On the left lateral malleolus the wound appears better. He has a new area on the left lower anterior pretibial area 6/29; 2-week follow-up. Culture I did of the right lateral foot 2 weeks ago showed MRSA. I gave him 2 weeks of Septra which he should be just about finishing now and this seems better. We got a call from home health last week to report swelling in the left leg because we were listed as his primary. We referred him back to primary care. He arrives today with intense erythema and tenderness around the 2 wounds on the left medial ankle on the left lower calf. The area on the right lateral foot looks better 7/7; I brought him back this week to follow-up on his left foot which had cellulitis last week. I gave him clindamycin which as far as I know he tolerated. The erythema  is better. He has a 3 current wounds one on the right lateral foot a small area on the right lateral malleolus and the more recent area on the left anterior pretibial area. 7/21; bilateral distal lower leg and foot wounds. The area on his right lateral foot looks a lot better. He has an area on his left pretibial area distally. The area on the medial malleolus on the left which is 1 of his worst wounds has closed over. He does not appear to have any open areas in his remaining toes 8/4-Patient returns at 2 weeks, he has the remaining wound on the left lower leg the other 2 wounds have healed. Patient is continued to be treated with silver alginate with a 2 layer wrap on the left 8/18; I been following this patient for a long period for bilateral lower extremity wounds. 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 His wounds are probably pressure although he religiously wears thick bunny boots. He does not have macrovascular disease but I did formal testing on him perhaps 2 or 3 years ago. I have often wondered about either cholesterol emboli or a vasculopathy of some sort given the distal nature of these variable locations etc. He has 2 areas that we have been following one on the left medial lower leg and one on the right lateral foot. Both of these are mostly healed today there is some eschar over sections of both areas although I did not disturb this. 03/31/2019 on evaluation today patient appears to be doing a little bit more poorly in regard to the sacral region. He has 2 small wounds noted at this point. Fortunately there is no signs of active infection at this time. No fevers, chills, nausea, vomiting, or diarrhea. He is having some pain when he sits on his gluteal region a big part of the reason  he has these wounds is likely due to the fact that his wheelchair has not been functioning properly. He does still have a couple areas of deep tissue  injury on his lower extremities he does need new Prevalon offloading boots as well. 04/28/2019 on evaluation today patient appears to be doing well with regard to his wounds in particular. His ankle area has fairly small based on what I am seeing currently and his sacral region actually is still open but does not appear to be doing too poorly in my opinion. Fortunately there is no signs of active infection at this time. He was in the hospital unfortunately and this was from 04/08/2019 through 04/12/2019 secondary to persistent diarrhea, dehydration and acute kidney injury. With that being said fortunately the patient does not seem to be doing too poorly at this time which is good news. He is having pain mainly in the sacral region. 06/23/2019 on evaluation today patient appears to be doing well with regard to his sacral region which in fact is completely healed. Unfortunately he has an area of rubbing/pressure on his right lateral chest/back region. This is where it is rubbing up on his chair. Subsequently he also has an open wound on the left medial malleolus which is not very open but starting to crack and then on the right lateral foot this is much more open at this point. Unfortunately there is no signs of significant improvement in regard to these areas and in fact there does appear to be evidence of active infection quite significantly. No fevers, chills, nausea, vomiting, or diarrhea. The patient did test positive for COVID-19 that has been greater than 14 days ago although they did not know the exact timing. That is why he was not here for his last appointment. 07/07/2019 upon evaluation today patient actually appears to be doing quite well in regard to his left ankle as well as the chest/back region which is doing great in fact appears to be healed in my opinion. He still has an issue on his lateral right foot that is open and appears to be infected but does have me more concerned. In fact I do  believe the doxycycline helped in general for the cellulitis but I believe that may have been more MRSA related based on the culture that we did. What I am seeing right now has more of the blue-green drainage and may be more consistent with a Pseudomonas that I was hoping was not really causing much of an issue but I think it may be at this point on the right lateral foot. For that reason I do think treatment would be a good idea of the reason I avoided the Cipro before was the potential for QT prolongation which could obviously cause problems with the patient with results of interaction with respiratory wound. The tizanidine also had some interactions as well. Nonetheless I think at this point that it would be a possibility for [place] to use topical gentamicin to see if this could be of benefit for the patient underneath the silver alginate dressing. 07/21/2019 upon evaluation today patient appears to be doing fairly well at this point in regard to his lower extremity on the right. He does have a wound on his right lateral foot as well as the right fifth toe and the right fourth toe. Unfortunately he continues to experience significant issues with pressure and trauma to some degree and I think this is just due to the fact that again he is  not really not able to control his legs and what is happening here. Fortunately there is no signs of active infection at this time. 08/04/2019 upon evaluation today patient actually appears to be doing quite well with regard to his toe ulcers I am very pleased in that regard. Unfortunately the foot ulcer is showing some signs of erythema his primary care provider has placed him on clindamycin at this point. Again I do believe this can be beneficial for him. With that being said I do think the patient may benefit from a light Kerlix and Coban wrap to help with some of his edema at this point. 08/18/2019 upon evaluation today patient appears to be doing still poorly in regard  to his lateral foot ulcer. This is not very deep but there is some concern about the possibility of osteomyelitis. I think it would be appropriate for [place] to go ahead and have an x-ray at this time. The patient is currently on clindamycin which from his culture which was performed on 06/23/2019 it does appear that he would benefit from that in regard to the MRSA. However Pseudomonas was also identified then and the patient actually has some blue-green drainage at this point as well. I am much more concerned about the possibility of the Pseudomonas being an infection is causative agent at this point that I am the MRSA. Subsequently Cipro the patient was sensitive to but we never use that is stable use a topical gent due to the fact that unfortunately there was some interaction between the Cipro and the patient's risk for down potentially causing QT prolongation. 08/31/2019; have not seen this patient in quite a period of time. He has a substantial wound on his right lateral foot we have been using gentamicin ointment with Hydrofera Blue over the top. From what I am able to determine he is also on antibiotics perhaps clindamycin although I have not had a chance to look over this. He also has an eschared area on the right fifth toe as well as the right second toe. 4/20; right lateral foot wound. He comes in today with intense erythema and tenderness around this wound. The wound itself has a surface on it but it doesn't look particularly healthy a lot of gelatinous debris. There is no palpable bone. We've been using gentamicin ointment and Hydrofera Blue. Since the patient was last here he was seen by Dr. Algis Liming of infectious disease. He ordered an MRI that I think is due later this month on 4/30. He apparently also checked a sedimentation rate and C-reactive protein metabolic panel CBC with differential. At the time he was seen he wanted to withhold any microbials pending identification of the depth of the  infection although looking at this I don't know that I'm going to be able to hold off for another 10 days. 4/29; he went on to have the MRI that was ordered by Dr. Algis Liming of infectious disease. 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 I had given him cefdinir last week for surrounding cellulitis he is finishing this today. I have communicated with Dr. Algis Liming who wants bone for pathology and culture before proceeding with consideration of IV antibiotics. Fortunately he is due to see Dr. Samuella Cota of podiatry today I am hopeful that the bone biopsy and culture will be considered there 5/4 as I understand things Dr. Samuella Cota actually wants to remove the diseased proximal fifth metatarsal. Looking back at this decision is probably not a bad  one. He has good blood flow he is nonambulatory they are apparently going to do this next week. Paradoxically the wound on the lateral part of his foot is actually a lot better. The cellulitis that was so problematic 2 weeks ago also has resolved. READMISSION 03/23/2020 T is now a 60 year old man he has cerebral palsy and is functionally paraplegic. The last time I saw him he had a diseased proximal fifth metatarsal. 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 Samuella Cota. Culture of this showed Pseudomonas and he was followed by infectious disease Dr. Algis Liming treated with ciprofloxacin. 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 He had Integra placed. A CandS showed Pseudomonas again. Since then he has had Silvadene as a primary dressing, then Santyl and more recently Xeroform and then back to Santyl 3 times a week per his attendant who comes with him. Reviewing his records I was really not prepared for the number of wounds that he currently has. This includes substantial areas on the right lateral foot,  right lateral ankle, right fourth toe dorsally, right ankle anteriorly as well as the left lateral ankle and foot. His medical history is unchanged ABIs 1.2 on the right and 1.21 on the left he has not been known to have arterial insufficiency and is not a diabetic 11/5; patient I readmitted to the clinic last week. He has multiple wounds on the right lateral foot, right fourth toe and a new area on the right second toe. He has areas on the right dorsal ankle and a substantive wound over the left lateral ankle and foot. We are using silver collagen. We put him under compression last week and this seems to have helped. He has home health coming out one other time per week to change the dressing 11/12; no real improvement. Multiple bilateral foot and ankle wounds. ooOn the left he has a substantial wound on the left lateral ankle ooOn the right he has wounds on the dorsal second and fourth toes. ooRight lateral foot we have merged to wounds these are necrotic ooShe has an area on the right dorsal ankle and a area on the right medial malleolus with marked surrounding erythema likely cellulitis. The patient complains about pain that kept him awake all last night from wraps that were too tight. He is not known to have arterial insufficiency. 11/18; no real change. I gave him empiric Levaquin last week directed at previously cultured Pseudomonas this is not really helped. On the right he has wounds on the right lateral foot right lateral ankle right medial ankle and the dorsal second and fourth toes. Most of these do not have a nice have a healthy surface. There is erythema in his foot which may all be venous stasis. The area on the left lateral malleolus/ankle area looks better 12/3; really disappointing if anything deterioration especially on the right lateral foot. These are necrotic. He also has areas on the right dorsal ankle right medial malleolus and the right second and fourth toes. On the left  he has a large area over the left medial ankle. There is some erythema around the wound. Things are very tender I have reviewed his most recent arterial Dopplers which are actually in 2019 but he has never been felt to have arterial issues. At that time his ABI on the right was 1.16 [no  TBI due to amputation] on the left his ABI was 1.20 with a great toe pressure of 0.24. Waveforms on the right and left toes were all normal he was not felt to have significant evidence of lower extremity arterial disease. ABIs in our clinic have always been normal his pulses have been palpable. He had an MRI of his right foot in April 2021 this showed a soft tissue ulceration along the lateral midfoot with underlying osteomyelitis no abscess. 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 He returned to our clinic on 03/23/2020. The same pattern of wounds that I am describing currently this was a lot worse than earlier in the year. I gave him empiric antibiotics because he had had Pseudomonas in the past this does not seem to have helped. We have not made any progress on any of these wound site. The patient is in a lot of pain 12/10; the patient comes in with a new deep tissue injury on the right heel large necrotic wound on the right lateral foot, smaller necrotic wound on the dorsal ankle and medial ankle. Necrotic wounds on the second and fourth toes. We have been using Hydrofera Blue On the left he has the area on the left little medial ankle which actually looks some better healthier looking tissue. We are using Hydrofera Blue here as well X-ray ordered last week showed no osseous erosion no plain film indication of osteomyelitis. 12/17; PCR culture I did of the right lateral foot wound last week showed a large titer of Enterococcus faecalis and low titers of of coag negative staph and Peptostreptococcus. I have started him on Augmentin for 2 weeks but I may continue beyond this. He has continuous complaints of  pain now apparently making it difficult for him to rest at night. Substantial wound area in the right foot and ankle. Unfortunately the left medial foot seems to be more substantial this week. 12/27; in general the wounds on the right foot and ankle and the left medial ankle look better. There is certainly less erythema in the right foot. I have him on Augmentin and I am going to extend this for another week. MRI is booked for 06/01/2020 06/02/2020; patient's extensive skin breakdown on the right foot continues it is stable to perhaps slightly improved however there is a considerable amount of tissue breakdown. Unfortunately his MRI shows findings consistent with osteomyelitis in the lateral aspect of the cuboid. The overlying wound in this area is deep but less necrotic than I am used to seeing. He has area on the right posterior heel is now necrotic.. The area on the left lateral ankle/foot looks somewhat better. The patient states he continues to be in a lot of pain from the right foot and he is really tired of hurting from these wounds. I have discussed things with T before. He is anxious to be out of pain. He is nonambulatory. Furthermore the osteomyelitis in the right foot and the erry extensive wounds in this area are only a source of potential systemic infection. He seems in favor of proceeding with a below-knee amputation. I cannot argue with this we have not made a lot of progress in several months working on these wounds ever since his fifth ray amputation by Dr. Samuella Cota 1/28; in general the patient's right foot looks better although there is several wounds still present there were certainly look less angry. The area on the right lateral foot looks better he still has a necrotic area  on the right Achilles heel but this is separating the right fourth toe and the right medial malleolus. We have been using silver alginate with Bactroban on all of these wounds. On the left medial ankle the wound  looks healthy we have been using Hydrofera Blue. He has an appointment I think on Monday with Dr. Victorino Dike with regards to a BKA. Although the wounds on the right foot look better than they did heat the patient still relates that he has occasional pain and he suffered with this through the years he would rather have a below-knee amputation. Provided Dr. Victorino Dike agrees with this he is anxious to proceed 2/11; the patient continues to make decent progress with the area on the left medial ankle. We are using Hydrofera Blue in this area. Although retreating the right foot wounds palliatively he continues to make improvement in the right lateral dorsal foot, right heel and the wounds on his toes. He saw Dr. Victorino Dike to also concurred with the amputation directed has underlying osteomyelitis. The patient has suffered a great deal with the pain in this foot from recurrent wounds although the wounds currently are making progress I do not think this decision is the wrong one. He is nonambulatory and has underlying osteomyelitis 2/25; the left medial ankle wound is much better. We have been using Hydrofera Blue on this home health changing the dressing ooAmputation with Dr. Victorino Dike on 07/27/2020. The wounds on his lateral foot are necrotic. The rest of the multiple wounds look the same. Objective Constitutional Vitals Time Taken: 11:41 AM, Temperature: 97.4 F, Pulse: 74 bpm, Respiratory Rate: 17 breaths/min, Blood Pressure: 129/74 mmHg. Integumentary (Hair, Skin) Wound #41 status is Open. Original cause of wound was Trauma. The date acquired was: 02/21/2020. The wound has been in treatment 17 weeks. The wound is located on the Right T Fourth. The wound measures 0.3cm length x 0.2cm width x 0.1cm depth; 0.047cm^2 area and 0.005cm^3 volume. There is Fat Layer oe (Subcutaneous Tissue) exposed. There is no tunneling or undermining noted. There is a small amount of serosanguineous drainage noted. The wound margin  is flat and intact. There is large (67-100%) red granulation within the wound bed. There is no necrotic tissue within the wound bed. Wound #43 status is Open. Original cause of wound was Pressure Injury. The date acquired was: 02/21/2020. The wound has been in treatment 17 weeks. The wound is located on the Right,Distal,Anterior Lower Leg. The wound measures 0.2cm length x 0.2cm width x 0.2cm depth; 0.031cm^2 area and 0.006cm^3 volume. There is Fat Layer (Subcutaneous Tissue) exposed. There is no tunneling or undermining noted. There is a medium amount of serosanguineous drainage noted. The wound margin is flat and intact. There is large (67-100%) red granulation within the wound bed. There is no necrotic tissue within the wound bed. Wound #44 status is Open. Original cause of wound was Pressure Injury. The date acquired was: 02/21/2020. The wound has been in treatment 17 weeks. The wound is located on the Right,Proximal,Lateral Foot. The wound measures 4.6cm length x 2.5cm width x 0.2cm depth; 9.032cm^2 area and 1.806cm^3 volume. There is tendon and Fat Layer (Subcutaneous Tissue) exposed. There is no tunneling or undermining noted. There is a medium amount of serosanguineous drainage noted. The wound margin is flat and intact. There is small (1-33%) red, pink granulation within the wound bed. There is a large (67-100%) amount of necrotic tissue within the wound bed including Adherent Slough. Wound #47 status is Open. Original cause of wound  was Gradually Appeared. The date acquired was: 05/22/2020. The wound has been in treatment 8 weeks. The wound is located on the Left,Medial Malleolus. The wound measures 1.9cm length x 2.4cm width x 0.2cm depth; 3.581cm^2 area and 0.716cm^3 volume. There is Fat Layer (Subcutaneous Tissue) exposed. There is no tunneling or undermining noted. There is a medium amount of serosanguineous drainage noted. The wound margin is distinct with the outline attached to the  wound base. There is large (67-100%) pink granulation within the wound bed. There is a small (1- 33%) amount of necrotic tissue within the wound bed including Adherent Slough. Wound #48 status is Open. Original cause of wound was Pressure Injury. The date acquired was: 06/02/2020. The wound has been in treatment 7 weeks. The wound is located on the Right Calcaneus. The wound measures 2cm length x 3.1cm width x 0.1cm depth; 4.869cm^2 area and 0.487cm^3 volume. There is Fat Layer (Subcutaneous Tissue) exposed. There is no tunneling or undermining noted. There is a small amount of serosanguineous drainage noted. The wound margin is flat and intact. There is small (1-33%) pink granulation within the wound bed. There is a large (67-100%) amount of necrotic tissue within the wound bed including [facility]char. 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 Bathing/ Shower/ Hygiene: May shower with protection but do not get wound dressing(s) wet. Other Bathing/Shower/Hygiene Orders/Instructions: - scrub legs gently with wash cloth and soap and water with dressing changes Edema Control - Lymphedema / SCD / Other: Elevate legs to the level of the heart or above for 30 minutes daily and/or when sitting, a frequency of: Off-Loading: Heel suspension boot to: - heel protectors at all times Other: - avoid any pressure to back of heels Home Health: No change in wound care orders this week; continue Home Health for wound care. May utilize formulary equivalent dressing for wound  treatment orders unless otherwise specified. Other Home Health Orders/Instructions: - [facility] WOUND #41: - T Fourth Wound Laterality: Right oe Cleanser: Soap and Water (Home Health) 2 x Per Week/30 Days Discharge Instructions: May shower and wash wound with dial antibacterial soap and water prior to dressing change. Cleanser: Wound Cleanser (Home Health) 2 x Per Week/30 Days Discharge Instructions: Cleanse the wound with wound cleanser prior to applying a clean dressing using gauze sponges, not tissue or cotton balls. Peri-Wound Care: Sween Lotion (Moisturizing lotion) 2 x Per Week/30 Days Discharge Instructions: Apply moisturizing lotion with dressing changes Topical: Bactroban (Home Health) 2 x Per Week/30 Days Discharge Instructions: apply to wound bed under alginate Prim Dressing: KerraCel Ag Gelling Fiber Dressing, 4x5 in (silver alginate) (Home Health) 2 x Per Week/30 Days ary Discharge Instructions: Apply silver alginate to wound bed as instructed Secondary Dressing: Woven Gauze Sponge, Non-Sterile 4x4 in (Home Health) 2 x Per Week/30 Days Discharge Instructions: Apply over primary dressing as directed. Secondary Dressing: ABD Pad, 5x9 (Home Health) 2 x Per Week/30 Days Discharge Instructions: Apply over primary dressing as directed. Com pression Wrap: Kerlix Roll 4.5x3.1 (in/yd) (Home Health) 2 x Per Week/30 Days Discharge Instructions: Apply Kerlix and Coban compression as directed. Com pression Wrap: Coban Self-Adherent Wrap 4x5 (in/yd) (Home Health)  2 x Per Week/30 Days Discharge Instructions: Apply over Kerlix as directed. WOUND #43: - Lower Leg Wound Laterality: Right, Anterior, Distal Cleanser: Soap and Water (Home Health) 2 x Per Week/30 Days Discharge Instructions: May shower and wash wound with dial antibacterial soap and water prior to dressing change. Cleanser: Wound Cleanser (Home Health) 2 x Per Week/30 Days Discharge Instructions: Cleanse the wound with wound  cleanser prior to applying a clean dressing using gauze sponges, not tissue or cotton balls. Peri-Wound Care: Sween Lotion (Moisturizing lotion) 2 x Per Week/30 Days Discharge Instructions: Apply moisturizing lotion with dressing changes Topical: Bactroban (Home Health) 2 x Per Week/30 Days Discharge Instructions: apply to wound bed under alginate Prim Dressing: KerraCel Ag Gelling Fiber Dressing, 4x5 in (silver alginate) (Home Health) 2 x Per Week/30 Days ary Discharge Instructions: Apply silver alginate to wound bed as instructed Secondary Dressing: Woven Gauze Sponge, Non-Sterile 4x4 in (Home Health) 2 x Per Week/30 Days Discharge Instructions: Apply over primary dressing as directed. Secondary Dressing: ABD Pad, 5x9 (Home Health) 2 x Per Week/30 Days Discharge Instructions: Apply over primary dressing as directed. Com pression Wrap: Kerlix Roll 4.5x3.1 (in/yd) (Home Health) 2 x Per Week/30 Days Discharge Instructions: Apply Kerlix and Coban compression as directed. Com pression Wrap: Coban Self-Adherent Wrap 4x5 (in/yd) (Home Health) 2 x Per Week/30 Days Discharge Instructions: Apply over Kerlix as directed. WOUND #44: - Foot Wound Laterality: Right, Lateral, Proximal Cleanser: Soap and Water (Home Health) 2 x Per Week/30 Days Discharge Instructions: May shower and wash wound with dial antibacterial soap and water prior to dressing change. Cleanser: Wound Cleanser (Home Health) 2 x Per Week/30 Days Discharge Instructions: Cleanse the wound with wound cleanser prior to applying a clean dressing using gauze sponges, not tissue or cotton balls. Peri-Wound Care: Sween Lotion (Moisturizing lotion) 2 x Per Week/30 Days Discharge Instructions: Apply moisturizing lotion with dressing changes Topical: Bactroban (Home Health) 2 x Per Week/30 Days Discharge Instructions: apply to wound bed under alginate Prim Dressing: KerraCel Ag Gelling Fiber Dressing, 4x5 in (silver alginate) (Home Health) 2 x  Per Week/30 Days ary Discharge Instructions: Apply silver alginate to wound bed as instructed Secondary Dressing: Woven Gauze Sponge, Non-Sterile 4x4 in (Home Health) 2 x Per Week/30 Days Discharge Instructions: Apply over primary dressing as directed. Secondary Dressing: ABD Pad, 5x9 (Home Health) 2 x Per Week/30 Days Discharge Instructions: Apply over primary dressing as directed. Com pression Wrap: Kerlix Roll 4.5x3.1 (in/yd) (Home Health) 2 x Per Week/30 Days Discharge Instructions: Apply Kerlix and Coban compression as directed. Com pression Wrap: Coban Self-Adherent Wrap 4x5 (in/yd) (Home Health) 2 x Per Week/30 Days Discharge Instructions: Apply over Kerlix as directed. WOUND #47: - Malleolus Wound Laterality: Left, Medial Cleanser: Soap and Water (Home Health) 2 x Per Week/30 Days Discharge Instructions: May shower and wash wound with dial antibacterial soap and water prior to dressing change. Cleanser: Wound Cleanser (Home Health) 2 x Per Week/30 Days Discharge Instructions: Cleanse the wound with wound cleanser prior to applying a clean dressing using gauze sponges, not tissue or cotton balls. Peri-Wound Care: Sween Lotion (Moisturizing lotion) (Home Health) 2 x Per Week/30 Days Discharge Instructions: Apply moisturizing lotion with dressing changes Prim Dressing: Hydrofera Blue Classic Foam, 4x4 in (Home Health) 2 x Per Week/30 Days ary Discharge Instructions: Apply to wound bed as instructed Secondary Dressing: Woven Gauze Sponge, Non-Sterile 4x4 in 2 x Per Week/30 Days Discharge Instructions: Apply over primary dressing as directed. Com pression Wrap: Kerlix Roll 4.5x3.1 (in/yd) (  Home Health) 2 x Per Week/30 Days Discharge Instructions: Apply Kerlix and Coban compression , be sure to wrap from base of toes to tibial tuberosity. no too tight Com pression Wrap: Coban Self-Adherent Wrap 4x5 (in/yd) (Home Health) 2 x Per Week/30 Days Discharge Instructions: Apply over Kerlix as  directed. WOUND #48: - Calcaneus Wound Laterality: Right Cleanser: Soap and Water (Home Health) 2 x Per Week/30 Days Discharge Instructions: May shower and wash wound with dial antibacterial soap and water prior to dressing change. Cleanser: Wound Cleanser (Home Health) 2 x Per Week/30 Days Discharge Instructions: Cleanse the wound with wound cleanser prior to applying a clean dressing using gauze sponges, not tissue or cotton balls. Peri-Wound Care: Sween Lotion (Moisturizing lotion) 2 x Per Week/30 Days Discharge Instructions: Apply moisturizing lotion with dressing changes Topical: Bactroban (Home Health) 2 x Per Week/30 Days Discharge Instructions: apply to wound bed under alginate Prim Dressing: KerraCel Ag Gelling Fiber Dressing, 4x5 in (silver alginate) (Home Health) 2 x Per Week/30 Days ary Discharge Instructions: Apply silver alginate to wound bed as instructed Secondary Dressing: Woven Gauze Sponge, Non-Sterile 4x4 in (Home Health) 2 x Per Week/30 Days Discharge Instructions: Apply over primary dressing as directed. Secondary Dressing: ABD Pad, 5x9 (Home Health) 2 x Per Week/30 Days Discharge Instructions: Apply over primary dressing as directed. Secondary Dressing: ALLEVYN Heel 4 1/2in x 5 1/2in / 10.5cm x 13.5cm (Home Health) 2 x Per Week/30 Days Discharge Instructions: Apply over primary dressing as directed. Com pression Wrap: Kerlix Roll 4.5x3.1 (in/yd) (Home Health) 2 x Per Week/30 Days Discharge Instructions: Apply Kerlix and Coban compression as directed. Com pression Wrap: Coban Self-Adherent Wrap 4x5 (in/yd) (Home Health) 2 x Per Week/30 Days Discharge Instructions: Apply over Kerlix as directed. 1. We continue with Hydrofera Blue to the left lower leg silver alginate on the right 2. The patient is going for an amputation of the right lower leg which I agree with. He has had necrotic wounds on his feet that have fluctuated quite a bit but have really not progressed  towards healing. He has suffered a lot of pain because of this he is nonambulatory and he has requested the amputation himself Electronic Signature(s) Signed: 07/21/2020 4:32:00 PM By: Baltazar Najjar MD Entered By: Baltazar Najjar on 07/21/2020 13:09:16 -------------------------------------------------------------------------------- SuperBill Details Patient Name: Date of Service: LANDRY, KAMATH 07/21/2020 Medical Record Number: [id number] Patient Account Number: [id number] Date of Birth/Sex: Treating RN: [id number] (59 y.o. [name] Primary Care Provider: [name] Other Clinician: Referring Provider: Treating Provider/Extender: [name], [name] in Treatment: 17 Diagnosis Coding ICD-10 Codes Code Description T81.31XD Disruption of external operation (surgical) wound, not elsewhere classified, subsequent encounter S90.811D Abrasion, right foot, subsequent encounter 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 L97.322 Non-pressure chronic ulcer of left ankle with fat layer exposed L89.616 Pressure-induced deep tissue damage of right heel Facility Procedures CPT4 Code: 40981191 Description: 47829 - WOUND CARE VISIT-LEV 5 EST PT Modifier: Quantity: 1 Physician Procedures : CPT4 Code Description Modifier [id number] - WC PHYS LEVEL 2 - EST PT ICD-10 Diagnosis Description L97.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 layer exposed T81.31XD Disruption of external operation (surgical) wound, not  elsewhere classified, subsequent encounter L97.322 Non-pressure chronic ulcer of left ankle with fat layer exposed Quantity: 1 Electronic Signature(s) Signed: 07/21/2020 4:32:00 PM By: Baltazar Najjar MD Entered By: Leanord Hawking,  Michael on 07/21/2020 13:09:46

## 2020-07-21 NOTE — Progress Notes (Signed)
[name], [name] ([id number]) Visit Report for 07/21/2020 Arrival Information Details Patient Name: Date of Service: [name], [name] 07/21/2020 10:45 A M Medical Record Number: [id number] Patient Account Number: [id number] Date of Birth/Sex: Treating RN: 07/18/1960 (60 y.o. [name], [name] Primary Care Provider: [name] Other Clinician: Referring Provider: Treating Provider/Extender: [name], [name] in Treatment: 17 Visit Information History Since Last Visit Added or deleted any medications: No Patient Arrived: Wheel Chair Any new allergies or adverse reactions: No Arrival Time: 11:39 Had a fall or experienced change in No Accompanied By: cg activities of daily living that may affect Transfer Assistance: None risk of falls: Patient Identification Verified: Yes Signs or symptoms of abuse/neglect since last visito No Secondary Verification Process Completed: Yes Hospitalized since last visit: No Patient Requires Transmission-Based Precautions: No Implantable device outside of the clinic excluding No Patient Has Alerts: No cellular tissue based products placed in the center since last visit: Has Dressing in Place as Prescribed: Yes Pain Present Now: Yes Electronic Signature(s) Signed: 07/21/2020 5:08:13 PM By: [name] RN Entered By: [name] on 07/21/2020 11:40:04 -------------------------------------------------------------------------------- Clinic Level of Care Assessment Details Patient Name: Date of Service: [name], [name] 07/21/2020 10:45 A M Medical Record Number: [id number] Patient Account Number: [id number] Date of Birth/Sex: Treating RN: 12-29-1960 (60 y.o. [name] Primary Care Provider: [name] Other Clinician: Referring Provider: Treating Provider/Extender: [name], [name] in Treatment: 17 Clinic Level of Care Assessment Items TOOL 4 Quantity Score []  - 0 Use  when only an EandM is performed on FOLLOW-UP visit ASSESSMENTS - Nursing Assessment / Reassessment X- 1 10 Reassessment of Co-morbidities (includes updates in patient status) X- 1 5 Reassessment of Adherence to Treatment Plan ASSESSMENTS - Wound and Skin A ssessment / Reassessment []  - 0 Simple Wound Assessment / Reassessment - one wound X- 3 5 Complex Wound Assessment / Reassessment - multiple wounds []  - 0 Dermatologic / Skin Assessment (not related to wound area) ASSESSMENTS - Focused Assessment X- 2 5 Circumferential Edema Measurements - multi extremities []  - 0 Nutritional Assessment / Counseling / Intervention X- 1 5 Lower Extremity Assessment (monofilament, tuning fork, pulses) []  - 0 Peripheral Arterial Disease Assessment (using hand held doppler) ASSESSMENTS - Ostomy and/or Continence Assessment and Care []  - 0 Incontinence Assessment and Management []  - 0 Ostomy Care Assessment and Management (repouching, etc.) PROCESS - Coordination of Care X - Simple Patient / Family Education for ongoing care 1 15 []  - 0 Complex (extensive) Patient / Family Education for ongoing care X- 1 10 Staff obtains Consents, Records, T Results / Process Orders est X- 1 10 Staff telephones HHA, Nursing Homes / Clarify orders / etc []  - 0 Routine Transfer to another Facility (non-emergent condition) []  - 0 Routine Hospital Admission (non-emergent condition) []  - 0 New Admissions / / Ordering NPWT Apligraf, etc. , []  - 0 Emergency Hospital Admission (emergent condition) X- 1 10 Simple Discharge Coordination []  - 0 Complex (extensive) Discharge Coordination PROCESS - Special Needs []  - 0 Pediatric / Minor Patient Management []  - 0 Isolation Patient Management []  - 0 Hearing / Language / Visual special needs []  - 0 Assessment of Community assistance (transportation, D/C planning, etc.) []  - 0 Additional assistance / Altered mentation []  - 0 Support  Surface(s) Assessment (bed, cushion, seat, etc.) INTERVENTIONS - Wound Cleansing / Measurement []  - 0 Simple Wound Cleansing - one wound X- 5 5 Complex Wound Cleansing - multiple wounds X- 1  5 Wound Imaging (photographs - any number of wounds) []  - 0 Wound Tracing (instead of photographs) []  - 0 Simple Wound Measurement - one wound X- 5 5 Complex Wound Measurement - multiple wounds INTERVENTIONS - Wound Dressings []  - 0 Small Wound Dressing one or multiple wounds X- 2 15 Medium Wound Dressing one or multiple wounds []  - 0 Large Wound Dressing one or multiple wounds X- 1 5 Application of Medications - topical []  - 0 Application of Medications - injection INTERVENTIONS - Miscellaneous []  - 0 External ear exam []  - 0 Specimen Collection (cultures, biopsies, blood, body fluids, etc.) []  - 0 Specimen(s) / Culture(s) sent or taken to Lab for analysis []  - 0 Patient Transfer (multiple staff / / Similar devices) []  - 0 Simple Staple / Suture removal (25 or less) []  - 0 Complex Staple / Suture removal (26 or more) []  - 0 Hypo / Hyperglycemic Management (close monitor of Blood Glucose) []  - 0 Ankle / Brachial Index (ABI) - do not check if billed separately X- 1 5 Vital Signs Has the patient been seen at the hospital within the last three years: Yes Total Score: 185 Level Of Care: New/Established - Level 5 Electronic Signature(s) Signed: 07/21/2020 4:54:48 PM By: RN, BSN Entered By: on 07/21/2020 12:23:44 -------------------------------------------------------------------------------- Lower Extremity Assessment Details Patient Name: Date of Service: [name], [name] 07/21/2020 10:45 A M Medical Record Number: Patient Account Number: Date of Birth/Sex: Treating RN: 12/26/1960 (60 y.o. , [name] Primary Care Provider: Other Clinician: Referring Provider: Treating Provider/Extender:  , [name] in Treatment: 17 Edema Assessment Assessed: 07/23/2020: No] Zenaida Deed: No] Edema: [Left: Yes] [Right: Yes] Calf Left: Right: Point of Measurement: From Medial Instep 29 cm 26 cm Ankle Left: Right: Point of Measurement: From Medial Instep 19 cm 16 cm Vascular Assessment Pulses: Dorsalis Pedis Palpable: [Left:Yes] [Right:Yes] Posterior Tibial Palpable: [Left:Yes] [Right:Yes] Electronic Signature(s) Signed: 07/21/2020 5:08:13 PM By: 07/23/2020 RN Entered By: [name] on 07/21/2020 11:45:02 -------------------------------------------------------------------------------- Multi-Disciplinary Care Plan Details Patient Name: Date of Service: [id number] 07/21/2020 10:45 A M Medical Record Number: 03/27/1961 Patient Account Number: [id number] Date of Birth/Sex: Treating RN: 12/09/1960 (60 y.o. [name] Primary Care Provider: Kyra Searles Other Clinician: Referring Provider: Treating Provider/Extender: Franne Forts, [name] in Treatment: 17 Active Inactive Wound/Skin Impairment Nursing Diagnoses: Knowledge deficit related to ulceration/compromised skin integrity Goals: Patient/caregiver will verbalize understanding of skin care regimen Date Initiated: 03/23/2020 Target Resolution Date: 08/18/2020 Goal Status: Active Ulcer/skin breakdown will have a volume reduction of 30% by week 4 Date Initiated: 03/31/2020 Date Inactivated: 04/28/2020 Target Resolution Date: 04/21/2020 Goal Status: Unmet Unmet Reason: infection, Ulcer/skin breakdown will have a volume reduction of 50% by week 8 Date Initiated: 04/28/2020 Date Inactivated: 05/22/2020 Target Resolution Date: 05/19/2020 Goal Status: Unmet Unmet Reason: eschar, infection Ulcer/skin breakdown will have a volume reduction of 80% by week 12 Date Initiated: 05/22/2020 Date Inactivated: 06/23/2020 Target Resolution Date: 07/21/2020 Goal Status:  Unmet Unmet Reason: infection Interventions: Assess patient/caregiver ability to perform ulcer/skin care regimen upon admission and as needed Provide education on ulcer and skin care Treatment Activities: Skin care regimen initiated : 03/23/2020 Topical wound management initiated : 03/23/2020 Notes: Electronic Signature(s) Signed: 07/21/2020 4:54:48 PM By: 08/20/2020 RN, BSN Entered By: 13/09/2019 on 07/21/2020 12:21:23 -------------------------------------------------------------------------------- Pain Assessment Details Patient Name: Date of Service: [name], [name] 07/21/2020 10:45 A M Medical Record Number: 05/24/2020 Patient Account  Number: [id number] Date of Birth/Sex: Treating RN: 1960/10/04 (60 y.o. [name] Primary Care Provider: [name] Other Clinician: Referring Provider: Treating Provider/Extender: [name], [name] in Treatment: 17 Active Problems Location of Pain Severity and Description of Pain Patient Has Paino Yes Site Locations Pain Location: Pain in Ulcers With Dressing Change: Yes Duration of the Pain. Constant / Intermittento Intermittent Rate the pain. Current Pain Level: 10 Worst Pain Level: 10 Least Pain Level: 0 Tolerable Pain Level: 10 Character of Pain Describe the Pain: Aching Pain Management and Medication Current Pain Management: Medication: Yes Cold Application: No Rest: Yes Massage: No Activity: No T.E.N.S.: No Heat Application: No Leg drop or elevation: No Is the Current Pain Management Adequate: Adequate How does your wound impact your activities of daily livingo Sleep: No Bathing: No Appetite: No Relationship With Others: No Bladder Continence: No Emotions: No Bowel Continence: No Work: No Toileting: No Drive: No Dressing: No Hobbies: No Electronic Signature(s) Signed: 07/21/2020 5:08:13 PM By: [name] RN Entered By: [name] on 07/21/2020  11:40:34 -------------------------------------------------------------------------------- Patient/Caregiver Education Details Patient Name: Date of Service: [name] 2/25/2022andnbsp10:45 A M Medical Record Number: [id number] Patient Account Number: [id number] Date of Birth/Gender: Treating RN: [birth year] (60 y.o. [name] Primary Care Physician: [name] Other Clinician: Referring Physician: Treating Physician/Extender: Lamarr Lulas in Treatment: 17 Education Assessment Education Provided To: Patient Education Topics Provided Venous: Methods: Explain/Verbal Responses: Reinforcements needed, State content correctly Wound/Skin Impairment: Methods: Explain/Verbal Responses: Reinforcements needed, State content correctly Electronic Signature(s) Signed: 07/21/2020 4:54:48 PM By: Zenaida Deed RN, BSN Entered By: Zenaida Deed on 07/21/2020 12:21:54 -------------------------------------------------------------------------------- Wound Assessment Details Patient Name: Date of Service: [name], [name] 07/21/2020 10:45 A M Medical Record Number: [id number] Patient Account Number: [id number] Date of Birth/Sex: Treating RN: 12/16/1960 (60 y.o. [name], [name] Primary Care Provider: [name] Other Clinician: Referring Provider: Treating Provider/Extender: [name], [name] in Treatment: 17 Wound Status Wound Number: [id number] Primary Lymphedema Etiology: Wound Location: Right T Fourth oe Wound Open Wounding Event: Trauma Status: Date Acquired: 02/21/2020 Comorbid Anemia, Deep Vein Thrombosis, Colitis, Osteomyelitis, Weeks Of Treatment: 17 History: Neuropathy, Quadriplegia, Confinement Anxiety Clustered Wound: No Photos Wound Measurements Length: (cm) 0.3 Width: (cm) 0.2 Depth: (cm) 0.1 Area: (cm) 0.047 Volume: (cm) 0.005 % Reduction in Area: 96.7% % Reduction in Volume:  96.5% Epithelialization: Medium (34-66%) Tunneling: No Undermining: No Wound Description Classification: Full Thickness Without Exposed Support Structures Wound Margin: Flat and Intact Exudate Amount: Small Exudate Type: Serosanguineous Exudate Color: red, brown Foul Odor After Cleansing: No Slough/Fibrino No Wound Bed Granulation Amount: Large (67-100%) Exposed Structure Granulation Quality: Red Fascia Exposed: No Necrotic Amount: None Present (0%) Fat Layer (Subcutaneous Tissue) Exposed: Yes Tendon Exposed: No Muscle Exposed: No Joint Exposed: No Bone Exposed: No Electronic Signature(s) Signed: 07/21/2020 4:34:48 PM By: Karl Ito Signed: 07/21/2020 5:08:13 PM By: [name] RN Entered By: Karl Ito on 07/21/2020 16:31:23 -------------------------------------------------------------------------------- Wound Assessment Details Patient Name: Date of Service: [name], [name] 07/21/2020 10:45 A M Medical Record Number: [id number] Patient Account Number: [id number] Date of Birth/Sex: Treating RN: July 29, 1960 (60 y.o. [name] Primary Care Provider: [name] Other Clinician: Referring Provider: Treating Provider/Extender: [name], [name] in Treatment: 17 Wound Status Wound Number: [id number] Primary Pressure Ulcer Etiology: Wound Location: Right, Distal, Anterior Lower Leg Secondary Venous Leg Ulcer Wounding Event: Pressure Injury Etiology: Date Acquired: 02/21/2020 Wound Open Weeks Of Treatment: 17 Status: Clustered Wound: Yes Comorbid Anemia, Deep  Vein Thrombosis, Colitis, Osteomyelitis, History: Neuropathy, Quadriplegia, Confinement Anxiety Photos Wound Measurements Length: (cm) 0.2 Width: (cm) 0.2 Depth: (cm) 0.2 Clustered Quantity: 1 Area: (cm) 0.031 Volume: (cm) 0.006 % Reduction in Area: 97.7% % Reduction in Volume: 97.8% Epithelialization: Medium (34-66%) Tunneling: No Undermining: No Wound  Description Classification: Category/Stage III Wound Margin: Flat and Intact Exudate Amount: Medium Exudate Type: Serosanguineous Exudate Color: red, brown Foul Odor After Cleansing: No Slough/Fibrino No Wound Bed Granulation Amount: Large (67-100%) Exposed Structure Granulation Quality: Red Fascia Exposed: No Necrotic Amount: None Present (0%) Fat Layer (Subcutaneous Tissue) Exposed: Yes Tendon Exposed: No Muscle Exposed: No Joint Exposed: No Bone Exposed: No Electronic Signature(s) Signed: 07/21/2020 4:34:48 PM By: Karl Itoawkins, Destiny Signed: 07/21/2020 5:08:13 PM By: Fonnie MuBreedlove, [name] RN Entered By: Karl Itoawkins, Destiny on 07/21/2020 16:32:01 -------------------------------------------------------------------------------- Wound Assessment Details Patient Name: Date of Service: [name] RhineHUMBLE, [name] 07/21/2020 10:45 A M Medical Record Number: [id number] Patient Account Number: [id number] Date of Birth/Sex: Treating RN: 23-Oct-1960 (60 y.o. Charlean MerlM) Breedlove, [name] Primary Care Provider: Jamison OkaBakare, Mobolaji Other Clinician: Referring Provider: Treating Provider/Extender: Alethia Bertholdobson, Michael Bakare, [name] in Treatment: 17 Wound Status Wound Number: 44 Primary Pressure Ulcer Etiology: Wound Location: Right, Proximal, Lateral Foot Wound Open Wounding Event: Pressure Injury Status: Date Acquired: 02/21/2020 Comorbid Anemia, Deep Vein Thrombosis, Colitis, Osteomyelitis, Weeks Of Treatment: 17 History: Neuropathy, Quadriplegia, Confinement Anxiety Clustered Wound: No Photos Wound Measurements Length: (cm) 4.6 Width: (cm) 2.5 Depth: (cm) 0.2 Area: (cm) 9.032 Volume: (cm) 1.806 % Reduction in Area: -19.7% % Reduction in Volume: 20.2% Epithelialization: Small (1-33%) Tunneling: No Undermining: No Wound Description Classification: Category/Stage IV Wound Margin: Flat and Intact Exudate Amount: Medium Exudate Type: Serosanguineous Exudate Color: red, brown Foul Odor After  Cleansing: No Slough/Fibrino Yes Wound Bed Granulation Amount: Small (1-33%) Exposed Structure Granulation Quality: Red, Pink Fascia Exposed: No Necrotic Amount: Large (67-100%) Fat Layer (Subcutaneous Tissue) Exposed: Yes Necrotic Quality: Adherent Slough Tendon Exposed: Yes Muscle Exposed: No Joint Exposed: No Bone Exposed: No Electronic Signature(s) Signed: 07/21/2020 4:34:48 PM By: Karl Itoawkins, Destiny Signed: 07/21/2020 5:08:13 PM By: Fonnie MuBreedlove, [name] RN Entered By: Karl Itoawkins, Destiny on 07/21/2020 16:33:03 -------------------------------------------------------------------------------- Wound Assessment Details Patient Name: Date of Service: [name] RhineHUMBLE, [name] 07/21/2020 10:45 A M Medical Record Number: [id number] Patient Account Number: [id number] Date of Birth/Sex: Treating RN: 23-Oct-1960 (60 y.o. Charlean MerlM) Breedlove, [name] Primary Care Provider: Jamison OkaBakare, Mobolaji Other Clinician: Referring Provider: Treating Provider/Extender: Alethia Bertholdobson, Michael Bakare, [name] in Treatment: 17 Wound Status Wound Number: 47 Primary Venous Leg Ulcer Etiology: Wound Location: Left, Medial Malleolus Wound Open Wounding Event: Gradually Appeared Status: Date Acquired: 05/22/2020 Comorbid Anemia, Deep Vein Thrombosis, Colitis, Osteomyelitis, Weeks Of Treatment: 8 History: Neuropathy, Quadriplegia, Confinement Anxiety Clustered Wound: No Photos Wound Measurements Length: (cm) 1.9 Width: (cm) 2.4 Depth: (cm) 0.2 Area: (cm) 3.581 Volume: (cm) 0.716 % Reduction in Area: 53.5% % Reduction in Volume: 7% Epithelialization: Small (1-33%) Tunneling: No Undermining: No Wound Description Classification: Full Thickness Without Exposed Support Structures Wound Margin: Distinct, outline attached Exudate Amount: Medium Exudate Type: Serosanguineous Exudate Color: red, brown Foul Odor After Cleansing: No Slough/Fibrino Yes Wound Bed Granulation Amount: Large (67-100%) Exposed  Structure Granulation Quality: Pink Fascia Exposed: No Necrotic Amount: Small (1-33%) Fat Layer (Subcutaneous Tissue) Exposed: Yes Necrotic Quality: Adherent Slough Tendon Exposed: No Muscle Exposed: No Joint Exposed: No Bone Exposed: No Electronic Signature(s) Signed: 07/21/2020 4:34:48 PM By: Karl Itoawkins, Destiny Signed: 07/21/2020 5:08:13 PM By: Fonnie MuBreedlove, [name] RN Entered By: Karl Itoawkins, Destiny on 07/21/2020 16:30:46 -------------------------------------------------------------------------------- Wound Assessment Details Patient Name: Date of Service:  [name], [name] 07/21/2020 10:45 A M Medical Record Number: [id number] Patient Account Number: [id number] Date of Birth/Sex: Treating RN: 03/20/1961 (60 y.o. [name], [name] Primary Care Provider: [name] Other Clinician: Referring Provider: Treating Provider/Extender: [name], [name] in Treatment: 17 Wound Status Wound Number: 48 Primary Pressure Ulcer Etiology: Wound Location: Right Calcaneus Wound Open Wounding Event: Pressure Injury Status: Date Acquired: 06/02/2020 Comorbid Anemia, Deep Vein Thrombosis, Colitis, Osteomyelitis, Weeks Of Treatment: 7 History: Neuropathy, Quadriplegia, Confinement Anxiety Clustered Wound: No Photos Wound Measurements Length: (cm) 2 Width: (cm) 3.1 Depth: (cm) 0.1 Area: (cm) 4.869 Volume: (cm) 0.487 % Reduction in Area: -18.1% % Reduction in Volume: -18.2% Epithelialization: None Tunneling: No Undermining: No Wound Description Classification: Unstageable/Unclassified Wound Margin: Flat and Intact Exudate Amount: Small Exudate Type: Serosanguineous Exudate Color: red, brown Foul Odor After Cleansing: No Slough/Fibrino No Wound Bed Granulation Amount: Small (1-33%) Exposed Structure Granulation Quality: Pink Fascia Exposed: No Necrotic Amount: Large (67-100%) Fat Layer (Subcutaneous Tissue) Exposed: Yes Necrotic Quality: Eschar Tendon  Exposed: No Muscle Exposed: No Joint Exposed: No Bone Exposed: No Electronic Signature(s) Signed: 07/21/2020 4:34:48 PM By: Karl Ito Signed: 07/21/2020 5:08:13 PM By: [name] RN Entered By: Karl Ito on 07/21/2020 16:32:29 -------------------------------------------------------------------------------- Vitals Details Patient Name: Date of Service: [name]. 07/21/2020 10:45 A M Medical Record Number: [id number] Patient Account Number: [id number] Date of Birth/Sex: Treating RN: 1960/11/29 (59 y.o. [name] Primary Care Provider: [name] Other Clinician: Referring Provider: Treating Provider/Extender: [name], [name] in Treatment: 17 Vital Signs Time Taken: 11:41 Temperature (F): 97.4 Pulse (bpm): 74 Respiratory Rate (breaths/min): 17 Blood Pressure (mmHg): 129/74 Reference Range: 80 - 120 mg / dl Electronic Signature(s) Signed: 07/21/2020 5:08:13 PM By: [name] RN Entered By: [name] on 07/21/2020 11:44:47

## 2020-07-26 ENCOUNTER — Other Ambulatory Visit: Payer: Self-pay

## 2020-07-26 ENCOUNTER — Encounter (HOSPITAL_COMMUNITY): Payer: Self-pay | Admitting: Orthopedic Surgery

## 2020-07-26 NOTE — Anesthesia Preprocedure Evaluation (Addendum)
Anesthesia Evaluation  Patient identified by MRN, date of birth, ID band Patient awake    Reviewed: Allergy & Precautions, NPO status , Patient's Chart, lab work & pertinent test results  Airway Mallampati: II  TM Distance: >3 FB     Dental   Pulmonary pneumonia,    breath sounds clear to auscultation       Cardiovascular  Rhythm:Regular Rate:Normal  History noted Dr. Chilton Si   Neuro/Psych PSYCHIATRIC DISORDERS Anxiety Depression Bipolar Disorder Schizophrenia    GI/Hepatic hiatal hernia, PUD, GERD  ,  Endo/Other  diabetes  Renal/GU      Musculoskeletal   Abdominal   Peds  Hematology  (+) anemia ,   Anesthesia Other Findings   Reproductive/Obstetrics                            Anesthesia Physical Anesthesia Plan  ASA: III  Anesthesia Plan: General   Post-op Pain Management:    Induction: Intravenous  PONV Risk Score and Plan: 2  Airway Management Planned: LMA  Additional Equipment:   Intra-op Plan:   Post-operative Plan: Extubation in OR  Informed Consent: I have reviewed the patients History and Physical, chart, labs and discussed the procedure including the risks, benefits and alternatives for the proposed anesthesia with the patient or authorized representative who has indicated his/her understanding and acceptance.     Dental advisory given  Plan Discussed with: CRNA and Anesthesiologist  Anesthesia Plan Comments:        Anesthesia Quick Evaluation

## 2020-07-26 NOTE — Progress Notes (Signed)
Fayrene Fearing, PA made aware of pt hx. 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

## 2020-07-26 NOTE — Progress Notes (Signed)
PCP Corky Downs, MD Cardiologist - unknown  Chest x-ray -  EKG - 11/10/19 Stress Test -  ECHO -  Cardiac Cath -    Blood Thinner Instructions:  Aspirin Instructions:   ERAS Protcol - clears until 0800  COVID TEST- DOS  Anesthesia review: yes  -------------  SDW INSTRUCTIONS:  Your procedure is scheduled on 07/27/20. Please report to [facility] Main Entrance "A" at 08:00 A.M., and check in at the Admitting office. Call this number if you have problems the morning of surgery: [phone]   Remember: Do not eat after midnight the night before your surgery  You may drink clear liquids until 0800 the morning of your surgery.   Clear liquids allowed are: Water, Non-Citrus Juices (without pulp), Carbonated Beverages, Clear Tea, Black Coffee Only, and Gatorade   Medications to take morning of surgery with a sip of water include: clindamycin (CLEOCIN) divalproex (DEPAKOTE SPRINKLE)  fluticasone (FLONASE)  omeprazole (PRILOSEC)  baclofen (LIORESAL)   If needed: acetaminophen (TYLENOL)  albuterol (VENTOLIN HFA)  LORazepam (ATIVAN)   As of today, STOP taking any Aspirin (unless otherwise instructed by your surgeon), Aleve, Naproxen, Ibuprofen, Motrin, Advil, Goody's, BC's, all herbal medications, fish oil, and all vitamins.    The Morning of Surgery Do not wear jewelry Do not wear lotions, powders, colognes, or deodorant Men may shave face and neck. Do not bring valuables to the hospital. [facility] is not responsible for any belongings or valuables. If you are a smoker, DO NOT Smoke 24 hours prior to surgery If you wear a CPAP at night please bring your mask the morning of surgery  Remember that you must have someone to transport you home after your surgery, and remain with you for 24 hours if you are discharged the same day. Please bring cases for contacts, glasses, hearing aids, dentures or bridgework because it cannot be worn into surgery.   Patients discharged the day  of surgery will not be allowed to drive home.   Please shower the NIGHT BEFORE SURGERY and the MORNING OF SURGERY with DIAL Soap. Wear comfortable clothes the morning of surgery. Oral Hygiene is also important to reduce your risk of infection.  Remember - BRUSH YOUR TEETH THE MORNING OF SURGERY WITH YOUR REGULAR TOOTHPASTE  Patient denies shortness of breath, fever, cough and chest pain.

## 2020-07-27 ENCOUNTER — Inpatient Hospital Stay (HOSPITAL_COMMUNITY): Payer: Medicare Other | Admitting: Physician Assistant

## 2020-07-27 ENCOUNTER — Other Ambulatory Visit: Payer: Self-pay

## 2020-07-27 ENCOUNTER — Encounter (HOSPITAL_COMMUNITY)
Admission: RE | Disposition: A | Discharge status: Home / Self Care | Payer: Self-pay | Source: Home / Self Care | Attending: Orthopedic Surgery

## 2020-07-27 ENCOUNTER — Ambulatory Visit (HOSPITAL_COMMUNITY)
Admission: RE | Admit: 2020-07-27 | Discharge: 2020-07-27 | Disposition: A | Discharge status: Home / Self Care | Payer: Medicare Other | Attending: Orthopedic Surgery | Admitting: Orthopedic Surgery

## 2020-07-27 ENCOUNTER — Encounter (HOSPITAL_COMMUNITY): Payer: Self-pay | Admitting: Orthopedic Surgery

## 2020-07-27 DIAGNOSIS — Z89411 Acquired absence of right great toe: Secondary | ICD-10-CM | POA: Diagnosis not present

## 2020-07-27 DIAGNOSIS — L97519 Non-pressure chronic ulcer of other part of right foot with unspecified severity: Secondary | ICD-10-CM | POA: Insufficient documentation

## 2020-07-27 DIAGNOSIS — Z89421 Acquired absence of other right toe(s): Secondary | ICD-10-CM | POA: Diagnosis not present

## 2020-07-27 DIAGNOSIS — E7800 Pure hypercholesterolemia, unspecified: Secondary | ICD-10-CM | POA: Insufficient documentation

## 2020-07-27 DIAGNOSIS — Z8711 Personal history of peptic ulcer disease: Secondary | ICD-10-CM | POA: Diagnosis not present

## 2020-07-27 DIAGNOSIS — Z8619 Personal history of other infectious and parasitic diseases: Secondary | ICD-10-CM | POA: Insufficient documentation

## 2020-07-27 DIAGNOSIS — E11621 Type 2 diabetes mellitus with foot ulcer: Secondary | ICD-10-CM | POA: Diagnosis not present

## 2020-07-27 DIAGNOSIS — Z20822 Contact with and (suspected) exposure to covid-19: Secondary | ICD-10-CM | POA: Insufficient documentation

## 2020-07-27 DIAGNOSIS — Z8614 Personal history of Methicillin resistant Staphylococcus aureus infection: Secondary | ICD-10-CM | POA: Diagnosis not present

## 2020-07-27 DIAGNOSIS — M868X7 Other osteomyelitis, ankle and foot: Secondary | ICD-10-CM | POA: Diagnosis not present

## 2020-07-27 DIAGNOSIS — E1169 Type 2 diabetes mellitus with other specified complication: Secondary | ICD-10-CM | POA: Insufficient documentation

## 2020-07-27 HISTORY — PX: AMPUTATION: SHX166

## 2020-07-27 LAB — CBC
HCT: 39.6 % (ref 39.0–52.0)
Hemoglobin: 12.8 g/dL — ABNORMAL LOW (ref 13.0–17.0)
MCH: 25.8 pg — ABNORMAL LOW (ref 26.0–34.0)
MCHC: 32.3 g/dL (ref 30.0–36.0)
MCV: 79.8 fL — ABNORMAL LOW (ref 80.0–100.0)
Platelets: 547 10*3/uL — ABNORMAL HIGH (ref 150–400)
RBC: 4.96 MIL/uL (ref 4.22–5.81)
RDW: 20.2 % — ABNORMAL HIGH (ref 11.5–15.5)
WBC: 8.2 10*3/uL (ref 4.0–10.5)
nRBC: 0 % (ref 0.0–0.2)

## 2020-07-27 LAB — BASIC METABOLIC PANEL
Anion gap: 11 (ref 5–15)
BUN: 9 mg/dL (ref 6–20)
CO2: 23 mmol/L (ref 22–32)
Calcium: 8.9 mg/dL (ref 8.9–10.3)
Chloride: 104 mmol/L (ref 98–111)
Creatinine, Ser: 0.53 mg/dL — ABNORMAL LOW (ref 0.61–1.24)
GFR, Estimated: >60 mL/min (ref >60)
Glucose, Bld: 79 mg/dL (ref 70–99)
Potassium: 4.1 mmol/L (ref 3.5–5.1)
Sodium: 138 mmol/L (ref 135–145)

## 2020-07-27 LAB — POCT I-STAT, CHEM 8
BUN: 10 mg/dL (ref 6–20)
Calcium, Ion: 1.22 mmol/L (ref 1.15–1.40)
Chloride: 107 mmol/L (ref 98–111)
Creatinine, Ser: 0.5 mg/dL — ABNORMAL LOW (ref 0.61–1.24)
Glucose, Bld: 85 mg/dL (ref 70–99)
HCT: 41 % (ref 39.0–52.0)
Hemoglobin: 13.9 g/dL (ref 13.0–17.0)
Potassium: 4.5 mmol/L (ref 3.5–5.1)
Sodium: 141 mmol/L (ref 135–145)
TCO2: 26 mmol/L (ref 22–32)

## 2020-07-27 LAB — SURGICAL PCR SCREEN
MRSA, PCR: NEGATIVE
Staphylococcus aureus: NEGATIVE

## 2020-07-27 LAB — SARS CORONAVIRUS 2 BY RT PCR (HOSPITAL ORDER, PERFORMED IN ~~LOC~~ HOSPITAL LAB): SARS Coronavirus 2: NEGATIVE

## 2020-07-27 SURGERY — AMPUTATION BELOW KNEE
Anesthesia: General | Site: Knee | Laterality: Right

## 2020-07-27 MED ORDER — VANCOMYCIN HCL 500 MG IV SOLR
INTRAVENOUS | Status: DC | PRN
  Administered 2020-07-27: 500 mg via TOPICAL

## 2020-07-27 MED ORDER — OXYCODONE HCL 5 MG PO TABS
5.0000 mg | ORAL_TABLET | Freq: Four times a day (QID) | ORAL | 0 refills | Status: AC | PRN
Start: 2020-07-27 — End: 2020-07-30

## 2020-07-27 MED ORDER — FENTANYL CITRATE (PF) 250 MCG/5ML IJ SOLN
INTRAMUSCULAR | Status: AC
  Filled 2020-07-27: qty 5

## 2020-07-27 MED ORDER — BUPIVACAINE LIPOSOME 1.3 % IJ SUSP
INTRAMUSCULAR | Status: DC | PRN
  Administered 2020-07-27: 20 mL

## 2020-07-27 MED ORDER — 0.9 % SODIUM CHLORIDE (POUR BTL) OPTIME
TOPICAL | Status: DC | PRN
  Administered 2020-07-27: 1000 mL

## 2020-07-27 MED ORDER — CEFAZOLIN SODIUM-DEXTROSE 2-4 GM/100ML-% IV SOLN
2.0000 g | INTRAVENOUS | Status: AC
  Administered 2020-07-27: 2 g via INTRAVENOUS
  Filled 2020-07-27: qty 100

## 2020-07-27 MED ORDER — HYDROMORPHONE HCL 1 MG/ML IJ SOLN
0.2500 mg | INTRAMUSCULAR | Status: DC | PRN
  Administered 2020-07-27 (×2): 0.25 mg via INTRAVENOUS

## 2020-07-27 MED ORDER — PROPOFOL 10 MG/ML IV BOLUS
INTRAVENOUS | Status: DC | PRN
  Administered 2020-07-27: 150 mg via INTRAVENOUS

## 2020-07-27 MED ORDER — OXYCODONE HCL 5 MG PO TABS
ORAL_TABLET | ORAL | Status: AC
  Administered 2020-07-27: 5 mg
  Filled 2020-07-27: qty 1

## 2020-07-27 MED ORDER — SODIUM CHLORIDE 0.9 % IV SOLN: INTRAVENOUS | Status: DC

## 2020-07-27 MED ORDER — CHLORHEXIDINE GLUCONATE 0.12 % MT SOLN: 15.0000 mL | Freq: Once | OROMUCOSAL | Status: AC

## 2020-07-27 MED ORDER — BUPIVACAINE LIPOSOME 1.3 % IJ SUSP
20.0000 mL | Freq: Once | INTRAMUSCULAR | Status: DC
  Filled 2020-07-27: qty 20

## 2020-07-27 MED ORDER — ORAL CARE MOUTH RINSE: 15.0000 mL | Freq: Once | OROMUCOSAL | Status: AC

## 2020-07-27 MED ORDER — ASPIRIN EC 81 MG PO TBEC: 81.0000 mg | DELAYED_RELEASE_TABLET | Freq: Two times a day (BID) | ORAL | 0 refills | Status: DC

## 2020-07-27 MED ORDER — DEXAMETHASONE SODIUM PHOSPHATE 10 MG/ML IJ SOLN
INTRAMUSCULAR | Status: DC | PRN
  Administered 2020-07-27: 5 mg via INTRAVENOUS

## 2020-07-27 MED ORDER — PHENYLEPHRINE 40 MCG/ML (10ML) SYRINGE FOR IV PUSH (FOR BLOOD PRESSURE SUPPORT)
PREFILLED_SYRINGE | INTRAVENOUS | Status: DC | PRN
  Administered 2020-07-27: 120 ug via INTRAVENOUS
  Administered 2020-07-27: 160 ug via INTRAVENOUS

## 2020-07-27 MED ORDER — FENTANYL CITRATE (PF) 250 MCG/5ML IJ SOLN
INTRAMUSCULAR | Status: DC | PRN
  Administered 2020-07-27 (×2): 25 ug via INTRAVENOUS

## 2020-07-27 MED ORDER — LIDOCAINE 2% (20 MG/ML) 5 ML SYRINGE
INTRAMUSCULAR | Status: DC | PRN
  Administered 2020-07-27: 80 mg via INTRAVENOUS

## 2020-07-27 MED ORDER — PHENYLEPHRINE HCL-NACL 10-0.9 MG/250ML-% IV SOLN
INTRAVENOUS | Status: DC | PRN
  Administered 2020-07-27: 20 ug/min via INTRAVENOUS

## 2020-07-27 MED ORDER — HYDROMORPHONE HCL 1 MG/ML IJ SOLN
INTRAMUSCULAR | Status: AC
  Filled 2020-07-27: qty 1

## 2020-07-27 MED ORDER — LACTATED RINGERS IV SOLN: INTRAVENOUS | Status: DC

## 2020-07-27 MED ORDER — ALBUMIN HUMAN 5 % IV SOLN: INTRAVENOUS | Status: DC | PRN

## 2020-07-27 MED ORDER — EPHEDRINE SULFATE-NACL 50-0.9 MG/10ML-% IV SOSY
PREFILLED_SYRINGE | INTRAVENOUS | Status: DC | PRN
  Administered 2020-07-27: 10 mg via INTRAVENOUS

## 2020-07-27 MED ORDER — MIDAZOLAM HCL 2 MG/2ML IJ SOLN
INTRAMUSCULAR | Status: AC
  Filled 2020-07-27: qty 2

## 2020-07-27 MED ORDER — CHLORHEXIDINE GLUCONATE 0.12 % MT SOLN
OROMUCOSAL | Status: AC
  Administered 2020-07-27: 15 mL via OROMUCOSAL
  Filled 2020-07-27: qty 15

## 2020-07-27 MED ORDER — VANCOMYCIN HCL 500 MG IV SOLR
INTRAVENOUS | Status: AC
  Filled 2020-07-27: qty 500

## 2020-07-27 SURGICAL SUPPLY — 66 items
BANDAGE ESMARK 6X9 LF (GAUZE/BANDAGES/DRESSINGS) ×1 IMPLANT
BLADE SAW RECIP 87.9 MT (BLADE) ×2 IMPLANT
BNDG COHESIVE 6X5 TAN STRL LF (GAUZE/BANDAGES/DRESSINGS) ×2 IMPLANT
BNDG ELASTIC 6X10 VLCR STRL LF (GAUZE/BANDAGES/DRESSINGS) ×2 IMPLANT
BNDG ELASTIC 6X5.8 VLCR STR LF (GAUZE/BANDAGES/DRESSINGS) IMPLANT
BNDG ESMARK 6X9 LF (GAUZE/BANDAGES/DRESSINGS) ×2
BRUSH SCRUB EZ PLAIN DRY (MISCELLANEOUS) ×4 IMPLANT
CHLORAPREP W/TINT 26 (MISCELLANEOUS) ×2 IMPLANT
COVER SURGICAL LIGHT HANDLE (MISCELLANEOUS) ×2 IMPLANT
COVER WAND RF STERILE (DRAPES) ×2 IMPLANT
CUFF TOURN SGL QUICK 34 (TOURNIQUET CUFF) ×2
CUFF TOURN SGL QUICK 42 (TOURNIQUET CUFF) IMPLANT
CUFF TRNQT CYL 34X4.125X (TOURNIQUET CUFF) ×1 IMPLANT
DRAPE EXTREMITY T 121X128X90 (DISPOSABLE) ×2 IMPLANT
DRAPE HALF SHEET 40X57 (DRAPES) ×2 IMPLANT
DRAPE INCISE IOBAN 66X45 STRL (DRAPES) ×2 IMPLANT
DRAPE ORTHO SPLIT 77X108 STRL (DRAPES) ×2
DRAPE SURG ORHT 6 SPLT 77X108 (DRAPES) ×1 IMPLANT
DRAPE U-SHAPE 47X51 STRL (DRAPES) ×4 IMPLANT
DRSG MEPITEL 4X7.2 (GAUZE/BANDAGES/DRESSINGS) ×4 IMPLANT
DRSG PAD ABDOMINAL 8X10 ST (GAUZE/BANDAGES/DRESSINGS) ×2 IMPLANT
ELECT CAUTERY BLADE 6.4 (BLADE) IMPLANT
ELECT REM PT RETURN 9FT ADLT (ELECTROSURGICAL) ×2
ELECTRODE REM PT RTRN 9FT ADLT (ELECTROSURGICAL) ×1 IMPLANT
EVACUATOR 1/8 PVC DRAIN (DRAIN) IMPLANT
GAUZE SPONGE 4X4 12PLY STRL (GAUZE/BANDAGES/DRESSINGS) ×2 IMPLANT
GAUZE SPONGE 4X4 12PLY STRL LF (GAUZE/BANDAGES/DRESSINGS) ×2 IMPLANT
GLOVE BIO SURGEON STRL SZ8 (GLOVE) ×2 IMPLANT
GLOVE ECLIPSE 8.0 STRL XLNG CF (GLOVE) ×4 IMPLANT
GLOVE SRG 8 PF TXTR STRL LF DI (GLOVE) ×2 IMPLANT
GLOVE SURG SYN 8.0 (GLOVE) ×4 IMPLANT
GLOVE SURG UNDER POLY LF SZ8 (GLOVE) ×4
GOWN STRL REUS W/ TWL LRG LVL3 (GOWN DISPOSABLE) ×1 IMPLANT
GOWN STRL REUS W/ TWL XL LVL3 (GOWN DISPOSABLE) ×1 IMPLANT
GOWN STRL REUS W/TWL LRG LVL3 (GOWN DISPOSABLE) ×2
GOWN STRL REUS W/TWL XL LVL3 (GOWN DISPOSABLE) ×2
KIT BASIN OR (CUSTOM PROCEDURE TRAY) ×2 IMPLANT
KIT TURNOVER KIT B (KITS) ×2 IMPLANT
MANIFOLD NEPTUNE II (INSTRUMENTS) ×2 IMPLANT
NEEDLE 18GX1X1/2 (RX/OR ONLY) (NEEDLE) ×2 IMPLANT
NS IRRIG 1000ML POUR BTL (IV SOLUTION) ×2 IMPLANT
PACK GENERAL/GYN (CUSTOM PROCEDURE TRAY) ×2 IMPLANT
PAD ARMBOARD 7.5X6 YLW CONV (MISCELLANEOUS) ×4 IMPLANT
PAD CAST 4YDX4 CTTN HI CHSV (CAST SUPPLIES) ×1 IMPLANT
PADDING CAST ABS 4INX4YD NS (CAST SUPPLIES) ×1
PADDING CAST ABS 6INX4YD NS (CAST SUPPLIES) ×1
PADDING CAST ABS COTTON 4X4 ST (CAST SUPPLIES) ×1 IMPLANT
PADDING CAST ABS COTTON 6X4 NS (CAST SUPPLIES) ×1 IMPLANT
PADDING CAST COTTON 4X4 STRL (CAST SUPPLIES) ×2
PADDING CAST COTTON 6X4 STRL (CAST SUPPLIES) ×2 IMPLANT
SPONGE LAP 18X18 RF (DISPOSABLE) IMPLANT
STAPLER VISISTAT 35W (STAPLE) ×2 IMPLANT
STOCKINETTE IMPERVIOUS LG (DRAPES) ×2 IMPLANT
SUT ETHILON 2 0 FS 18 (SUTURE) ×4 IMPLANT
SUT MNCRL AB 3-0 PS2 18 (SUTURE) ×4 IMPLANT
SUT PDS AB 0 CT1 27 (SUTURE) ×2 IMPLANT
SUT PDS AB 1 CT  36 (SUTURE) ×4
SUT PDS AB 1 CT 36 (SUTURE) ×2 IMPLANT
SUT SILK 2 0 (SUTURE) ×2
SUT SILK 2-0 18XBRD TIE 12 (SUTURE) ×1 IMPLANT
SWAB COLLECTION DEVICE MRSA (MISCELLANEOUS) IMPLANT
SWAB CULTURE ESWAB REG 1ML (MISCELLANEOUS) IMPLANT
SYR 30ML LL (SYRINGE) ×2 IMPLANT
TOWEL GREEN STERILE (TOWEL DISPOSABLE) ×2 IMPLANT
TOWEL GREEN STERILE FF (TOWEL DISPOSABLE) ×2 IMPLANT
WATER STERILE IRR 1000ML POUR (IV SOLUTION) ×2 IMPLANT

## 2020-07-27 NOTE — Anesthesia Procedure Notes (Signed)
Procedure Name: LMA Insertion Date/Time: 07/27/2020 11:08 AM Performed by: Carlos American, CRNA Pre-anesthesia Checklist: Patient identified, Emergency Drugs available, Suction available and Patient being monitored Patient Re-evaluated:Patient Re-evaluated prior to induction Oxygen Delivery Method: Circle System Utilized Preoxygenation: Pre-oxygenation with 100% oxygen Induction Type: IV induction Ventilation: Mask ventilation without difficulty LMA: LMA inserted LMA Size: 4.0 Number of attempts: 1 Airway Equipment and Method: Bite block Placement Confirmation: positive ETCO2 Tube secured with: Tape Dental Injury: Teeth and Oropharynx as per pre-operative assessment

## 2020-07-27 NOTE — Transfer of Care (Signed)
Immediate Anesthesia Transfer of Care Note  Patient: [name]  Procedure(s) Performed: AMPUTATION BELOW KNEE (Right Knee)  Patient Location: PACU  Anesthesia Type:General  Level of Consciousness: drowsy, patient cooperative and responds to stimulation  Airway & Oxygen Therapy: Patient Spontanous Breathing  Post-op Assessment: Report given to RN and Post -op Vital signs reviewed and stable  Post vital signs: Reviewed and stable  Last Vitals:  Vitals Value Taken Time  BP 122/73 07/27/20 1204  Temp    Pulse 73 07/27/20 1204  Resp 7 07/27/20 1204  SpO2 98 % 07/27/20 1204  Vitals shown include unvalidated device data.  Last Pain:  Vitals:   07/27/20 0927  TempSrc:   PainSc: 0-No pain         Complications: No complications documented.

## 2020-07-27 NOTE — Anesthesia Postprocedure Evaluation (Signed)
Anesthesia Post Note  Patient: [name]  Procedure(s) Performed: AMPUTATION BELOW KNEE (Right Knee)     Patient location during evaluation: PACU Anesthesia Type: General Level of consciousness: awake Pain management: pain level controlled Vital Signs Assessment: post-procedure vital signs reviewed and stable Respiratory status: spontaneous breathing Cardiovascular status: stable Postop Assessment: no apparent nausea or vomiting Anesthetic complications: no   No complications documented.  Last Vitals:  Vitals:   07/27/20 1235 07/27/20 1250  BP: 115/62 119/63  Pulse: 74 73  Resp: 11 12  Temp:  (!) 36.1 C  SpO2: 96% 97%    Last Pain:  Vitals:   07/27/20 1235  TempSrc:   PainSc: 5                  Jaelie Aguilera

## 2020-07-27 NOTE — Op Note (Signed)
07/27/2020  12:09 PM  PATIENT:  [name]  60 y.o. male  PRE-OPERATIVE DIAGNOSIS:  right foot osteomyelitis  POST-OPERATIVE DIAGNOSIS:  right foot osteomyelitis  Procedure(s):  Right below knee amputation  SURGEON:  Toni Arthurs, MD  ASSISTANT: Alfredo Martinez, PA-C  ANESTHESIA:   General  EBL:  minimal   TOURNIQUET:   Total Tourniquet Time Documented: Thigh (Right) - 19 minutes Total: Thigh (Right) - 19 minutes  COMPLICATIONS:  None apparent  DISPOSITION:  Extubated, awake and stable to recovery.  INDICATION FOR PROCEDURE: The patient is a 60 year old male with a past medical history significant for cerebral palsy.  He is nonambulatory and spends most of his day seated in a power chair.  He has developed ulcer of his right lower extremity that has progressed to osteomyelitis and has failed to heal despite extensive wound care.  He presents today for below-knee amputation.  He and his sister who is power of attorney understand the risks and benefits of the alternative treatment options and would like to proceed with surgical treatment.  They specifically understand risks of bleeding, infection, nerve damage, blood clots, need for additional surgery such as revision amputation and death.  PROCEDURE IN DETAIL: After preoperative consent was obtained and the correct operative site was identified, the patient was brought to the operating room and placed upon the operating table.  General anesthesia was induced.  Preoperative antibiotics were administered.  A surgical timeout was taken.  The right lower extremity was prepped and draped in standard sterile fashion with a tourniquet around the thigh.  The extremity was exsanguinated and the tourniquet inflated to 250 mmHg.  A posterior flap incision was marked on the skin 15 cm distal from the medial joint line.  The incision was made and sharp dissection carried down through the subcutaneous tissues to the tibia.  The periosteum was incised  and elevated.  Soft tissues were protected, and the tibia was transected with a reciprocating saw.  The fibula was then exposed and soft tissues protected.  The fibula was cut with a reciprocating saw 1 cm proximal from the tibial cut.  The tibia was then retracted anteriorly and the amputation knife was used to cut through the posterior soft tissues beveling the flap appropriately.  The leg was passed off the field as a specimen to pathology.  The cut surfaces of bone were smoothed with a rondure and rasp.  The wound was irrigated copiously.  Named arteries were suture ligated with 2-0 silk ties.  Named nerves were sharply divided on gentle traction and allowed to retract proximally.  The gastrocnemius fascia was then repaired to the anterior periosteum with imbricating sutures of 0 PDS.  Deep subcutaneous tissues were approximated with simple sutures of 0 PDS.  Subcutaneous tissues were approximated Monocryl.  The incision was closed with staples.  20 cc of Exparel were infiltrated into the subcutaneous tissues at the operative site.  Sterile dressings were applied followed by a compression wrap and a posterior splint to protect the stump.  The tourniquet was released after application of the dressings.  The patient was awakened from anesthesia and transported to the recovery room in stable condition.  FOLLOW UP PLAN: Nonweightbearing on the right lower extremity.  Follow-up in the office in 2 weeks for suture removal and wound check.  Plan to use a stump shrinker sock starting at that visit.     Alfredo Martinez PA-C was present and scrubbed for the duration of the operative case. His  assistance was essential in positioning the patient, prepping and draping, gaining and maintaining exposure, performing the operation, closing and dressing the wounds and applying the splint.

## 2020-07-27 NOTE — Discharge Instructions (Addendum)
Toni Arthurs, MD [facility]  Please read the following information regarding your care after surgery.  Medications  You only need a prescription for the narcotic pain medicine (ex. oxycodone, Percocet, Norco).  All of the other medicines listed below are available over the counter. X Aleve 2 pills twice a day for the first 3 days after surgery. X acetominophen (Tylenol) 650 mg every 4-6 hours as you need for minor to moderate pain X oxycodone as prescribed for severe pain  Narcotic pain medicine (ex. oxycodone, Percocet, Vicodin) will cause constipation.  To prevent this problem, take the following medicines while you are taking any pain medicine. X Resume miralax  X To help prevent blood clots, take a baby aspirin (81 mg) twice a day for two weeks after surgery.  You should also get up every hour while you are awake to move around.    Weight Bearing X Do not bear any weight on the operated leg or foot.  Cast / Splint / Dressing X Keep your splint, cast or dressing clean and dry.  Don't put anything (coat hanger, pencil, etc) down inside of it.  If it gets damp, use a hair dryer on the cool setting to dry it.  If it gets soaked, call the office to schedule an appointment for a cast change.   After your dressing, cast or splint is removed; you may shower, but do not soak or scrub the wound.  Allow the water to run over it, and then gently pat it dry.  Swelling It is normal for you to have swelling where you had surgery.  To reduce swelling and pain, keep your toes above your nose for at least 3 days after surgery.  It may be necessary to keep your foot or leg elevated for several weeks.  If it hurts, it should be elevated.  Follow Up Call my office at [phone] when you are discharged from the hospital or surgery center to schedule an appointment to be seen two weeks after surgery.  Call my office at [phone] if you develop a fever >101.5 F, nausea, vomiting, bleeding from the  surgical site or severe pain.

## 2020-07-27 NOTE — H&P (Signed)
[name] is an 60 y.o. male.   Chief complaint: Right chronic nonhealing foot ulcers HPI: The patient is a 60 year old male with a past medical history significant for cerebral palsy.  He does not ambulate and spends most of his day in a power chair.  He has chronic ulcers of his right foot with underlying osteomyelitis that have failed all nonoperative treatment including antibiotics and wound care.  He presents today for right below-knee amputation.   Past Medical History:  Diagnosis Date  . Anemia    microcytic anemia   . Anxiety    takes Ativan daily as needed  . Bipolar disorder (HCC)    takes Depakote daily  . CEREBRAL PALSY 07/24/2006  . Chronic bronchitis (HCC)    "gets it q yr"  . Chronic bronchitis (HCC)   . 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)   . Constipation    takes Miralax daily as needed  . Depression    takes Risperdal nightly  . Environmental allergies    takes Zyrtec daily  . GASTRIC ULCER ACUTE WITH HEMORRHAGE 07/24/2006  . Gastric ulcer with hemorrhage    hx of   . GERD (gastroesophageal reflux disease)    takes Pravacid daily  . History of blood transfusion    no abnormal reaction noted  . History of hiatal hernia   . History of shingles   . History of stomach ulcers ~ 1996  . Hypercholesterolemia   . Intellectual functioning disability   . Intermittent explosive disorder   . Internal hemorrhoids   . MRSA infection 09/06/2019  . Obsessive compulsive disorder   . Osteomyelitis (HCC)    right foot   . Pneumonia "quite a few times"   in 2015 and in June 2016  . Pressure ulcer of sacral region    hx of   . Pseudomonas aeruginosa infection 09/06/2019  . Psychotic disorder (HCC)   . Recurrent UTI (urinary tract infection)   . Seborrheic dermatitis   . Sigmoid diverticulosis   . Spastic tetraplegia (HCC)   . URINARY INCONTINENCE 02/09/2010   takes Detrol daily-occasionally incontinence  . Vitamin D deficiency   .  Weakness    numbness in extremities    Past Surgical History:  Procedure Laterality Date  . AMPUTATION Right 12/14/2014   Procedure: AMPUTATION DIGIT RIGHT GREAT TOE MTP;  Surgeon: Nadara Mustard, MD;  Location: [facility] OR;  Service: Orthopedics;  Laterality: Right;  . AMPUTATION Right 10/08/2019   Procedure: RIGHT FIFTH RAY AMPUTATION, ADJACENT TISSUE TRANSFER;  Surgeon: Park Liter, DPM;  Location: [facility];  Service: Podiatry;  Laterality: Right;  . BIOPSY  08/12/2018   Procedure: BIOPSY;  Surgeon: Lynann Bologna, MD;  Location: [facility];  Service: Gastroenterology;;  . COLONOSCOPY WITH PROPOFOL N/A 08/14/2018   Procedure: COLONOSCOPY WITH PROPOFOL;  Surgeon: Lynann Bologna, MD;  Location: [facility];  Service: Gastroenterology;  Laterality: N/A;  . ESOPHAGOGASTRODUODENOSCOPY (EGD) WITH PROPOFOL N/A 08/12/2018   Procedure: ESOPHAGOGASTRODUODENOSCOPY (EGD) WITH PROPOFOL;  Surgeon: Lynann Bologna, MD;  Location: [facility];  Service: Gastroenterology;  Laterality: N/A;  . GRAFT APPLICATION Right 11/12/2019   Procedure: SKIN GRAFT SUBSTITUTE APPLICATION;  Surgeon: Park Liter, DPM;  Location: [facility];  Service: Podiatry;  Laterality: Right;  . HERNIA REPAIR    . HIATAL HERNIA REPAIR  1982  . LEG SURGERY Bilateral ~ 1967   "legs were scissored; stretched them out"  . WOUND DEBRIDEMENT Right 11/12/2019   Procedure: DEBRIDEMENT WOUND;  Surgeon: Park Liter, DPM;  Location: [facility];  Service: Podiatry;  Laterality: Right;    Family History  Adopted: Yes    Social History:  reports that he has never smoked. He has never used smokeless tobacco. He reports that he does not drink alcohol and does not use drugs.  Allergies:  Allergies  Allergen Reactions  . Adhesive [Tape] Rash    Medications: I have reviewed the patient's current medications.  Results for orders placed or performed during the hospital encounter of 07/27/20 (from the past 48 hour(s))  SARS Coronavirus 2 by RT PCR  (hospital order, performed in [facility] hospital lab) Nasopharyngeal Nasopharyngeal Swab     Status: None   Collection Time: 07/27/20  8:24 AM   Specimen: Nasopharyngeal Swab  Result Value Ref Range   SARS Coronavirus 2 NEGATIVE NEGATIVE    Comment: (NOTE) SARS-CoV-2 target nucleic acids are NOT DETECTED.  The SARS-CoV-2 RNA is generally detectable in upper and lower respiratory specimens during the acute phase of infection. The lowest concentration of SARS-CoV-2 viral copies this assay can detect is 250 copies / mL. A negative result does not preclude SARS-CoV-2 infection and should not be used as the sole basis for treatment or other patient management decisions.  A negative result may occur with improper specimen collection / handling, submission of specimen other than nasopharyngeal swab, presence of viral mutation(s) within the areas targeted by this assay, and inadequate number of viral copies (<250 copies / mL). A negative result must be combined with clinical observations, patient history, and epidemiological information.  Fact Sheet for Patients:   BoilerBrush.com.cy  Fact Sheet for Healthcare Providers: https://pope.com/  This test is not yet approved or  cleared by the [place] FDA and has been authorized for detection and/or diagnosis of SARS-CoV-2 by FDA under an Emergency Use Authorization (EUA).  This EUA will remain in effect (meaning this test can be used) for the duration of the COVID-19 declaration under Section 564(b)(1) of the Act, 21 U.S.C. section 360bbb-3(b)(1), unless the authorization is terminated or revoked sooner.  Performed at [facility], [address], [place], [place] [address]   CBC per protocol     Status: Abnormal   Collection Time: 07/27/20  9:13 AM  Result Value Ref Range   WBC 8.2 4.0 - 10.5 K/uL   RBC 4.96 4.22 - 5.81 MIL/uL   Hemoglobin 12.8 (L) 13.0 - 17.0 g/dL   HCT 53.6  14.4 - 31.5 %   MCV 79.8 (L) 80.0 - 100.0 fL   MCH 25.8 (L) 26.0 - 34.0 pg   MCHC 32.3 30.0 - 36.0 g/dL   RDW 40.0 (H) 86.7 - 61.9 %   Platelets 547 (H) 150 - 400 K/uL   nRBC 0.0 0.0 - 0.2 %    Comment: Performed at [facility], [address], [place], [place] [address]    No results found.  ROS: No recent fever, chills, nausea, vomiting or changes in his appetite PE:  Blood pressure 115/84, pulse 74, temperature 98.2 F (36.8 C), temperature source Oral, resp. rate 20, height 6\' 2"  (1.88 m), SpO2 97 %. Thin cachectic male in no apparent distress.  Alert and oriented.  Extraocular motions are intact.  Respirations are unlabored.  Speech is somewhat difficult to understand.  His right lower extremity has flexion contractures at the hip and knee.  Chronic ulcers of foot are bandaged today.  Healthy-appearing skin at the proposed amputation site.  Assessment/Plan:  Chronic right foot ulcer in nonambulatory patient -to the operating room today for right below-knee amputation.  The risks and benefits of the alternative treatment options have been discussed in detail.  The patient and his sister who is POA wish to proceed with surgery and specifically understand risks of bleeding, infection, nerve damage, blood clots, need for additional surgery, amputation and death.   Toni Arthurs 08-24-2020, 10:18 AM

## 2020-07-27 NOTE — Progress Notes (Signed)
Wasted 0.5 mg dilaudid with witness Dietitian .

## 2020-07-27 NOTE — Progress Notes (Signed)
Patient presented to short stay for surgery from group home with 2 small areas on left buttocks of healing pressure wound, scabbed area that has blanchable perimeters; 2 mepilex dressings placed and sacral dressing placed.

## 2020-07-28 ENCOUNTER — Encounter (HOSPITAL_COMMUNITY): Payer: Self-pay | Admitting: Orthopedic Surgery

## 2020-07-28 LAB — SURGICAL PATHOLOGY

## 2020-07-31 ENCOUNTER — Other Ambulatory Visit: Payer: Self-pay

## 2020-07-31 ENCOUNTER — Emergency Department (HOSPITAL_COMMUNITY): Payer: Medicare Other

## 2020-07-31 ENCOUNTER — Encounter (HOSPITAL_COMMUNITY): Payer: Self-pay

## 2020-07-31 ENCOUNTER — Inpatient Hospital Stay (HOSPITAL_COMMUNITY)
Admission: EM | Admit: 2020-07-31 | Discharge: 2020-08-03 | DRG: 177 | Disposition: A | Discharge status: Nursing Home (Includes ICF) | Payer: Medicare Other | Attending: Internal Medicine | Admitting: Internal Medicine

## 2020-07-31 DIAGNOSIS — R532 Functional quadriplegia: Secondary | ICD-10-CM | POA: Diagnosis present

## 2020-07-31 DIAGNOSIS — Z8614 Personal history of Methicillin resistant Staphylococcus aureus infection: Secondary | ICD-10-CM

## 2020-07-31 DIAGNOSIS — R112 Nausea with vomiting, unspecified: Secondary | ICD-10-CM | POA: Diagnosis present

## 2020-07-31 DIAGNOSIS — L89152 Pressure ulcer of sacral region, stage 2: Secondary | ICD-10-CM | POA: Diagnosis present

## 2020-07-31 DIAGNOSIS — J189 Pneumonia, unspecified organism: Secondary | ICD-10-CM | POA: Diagnosis present

## 2020-07-31 DIAGNOSIS — D75839 Thrombocytosis, unspecified: Secondary | ICD-10-CM | POA: Diagnosis present

## 2020-07-31 DIAGNOSIS — R1310 Dysphagia, unspecified: Secondary | ICD-10-CM | POA: Diagnosis present

## 2020-07-31 DIAGNOSIS — F419 Anxiety disorder, unspecified: Secondary | ICD-10-CM | POA: Diagnosis present

## 2020-07-31 DIAGNOSIS — L8991 Pressure ulcer of unspecified site, stage 1: Secondary | ICD-10-CM | POA: Diagnosis present

## 2020-07-31 DIAGNOSIS — L89892 Pressure ulcer of other site, stage 2: Secondary | ICD-10-CM | POA: Diagnosis present

## 2020-07-31 DIAGNOSIS — M868X7 Other osteomyelitis, ankle and foot: Secondary | ICD-10-CM | POA: Diagnosis present

## 2020-07-31 DIAGNOSIS — J690 Pneumonitis due to inhalation of food and vomit: Secondary | ICD-10-CM | POA: Diagnosis present

## 2020-07-31 DIAGNOSIS — L97519 Non-pressure chronic ulcer of other part of right foot with unspecified severity: Secondary | ICD-10-CM | POA: Diagnosis present

## 2020-07-31 DIAGNOSIS — K219 Gastro-esophageal reflux disease without esophagitis: Secondary | ICD-10-CM | POA: Diagnosis present

## 2020-07-31 DIAGNOSIS — Z8711 Personal history of peptic ulcer disease: Secondary | ICD-10-CM

## 2020-07-31 DIAGNOSIS — F429 Obsessive-compulsive disorder, unspecified: Secondary | ICD-10-CM | POA: Diagnosis present

## 2020-07-31 DIAGNOSIS — J918 Pleural effusion in other conditions classified elsewhere: Secondary | ICD-10-CM | POA: Diagnosis present

## 2020-07-31 DIAGNOSIS — D509 Iron deficiency anemia, unspecified: Secondary | ICD-10-CM | POA: Diagnosis present

## 2020-07-31 DIAGNOSIS — L89303 Pressure ulcer of unspecified buttock, stage 3: Secondary | ICD-10-CM | POA: Diagnosis present

## 2020-07-31 DIAGNOSIS — F319 Bipolar disorder, unspecified: Secondary | ICD-10-CM | POA: Diagnosis present

## 2020-07-31 DIAGNOSIS — K5900 Constipation, unspecified: Secondary | ICD-10-CM | POA: Diagnosis present

## 2020-07-31 DIAGNOSIS — Z91048 Other nonmedicinal substance allergy status: Secondary | ICD-10-CM

## 2020-07-31 DIAGNOSIS — F79 Unspecified intellectual disabilities: Secondary | ICD-10-CM | POA: Diagnosis present

## 2020-07-31 DIAGNOSIS — Z89511 Acquired absence of right leg below knee: Secondary | ICD-10-CM

## 2020-07-31 DIAGNOSIS — K449 Diaphragmatic hernia without obstruction or gangrene: Secondary | ICD-10-CM | POA: Diagnosis present

## 2020-07-31 DIAGNOSIS — Z20822 Contact with and (suspected) exposure to covid-19: Secondary | ICD-10-CM | POA: Diagnosis present

## 2020-07-31 DIAGNOSIS — D649 Anemia, unspecified: Secondary | ICD-10-CM | POA: Diagnosis present

## 2020-07-31 DIAGNOSIS — G808 Other cerebral palsy: Secondary | ICD-10-CM | POA: Diagnosis present

## 2020-07-31 LAB — URINALYSIS, ROUTINE W REFLEX MICROSCOPIC
Bilirubin Urine: NEGATIVE
Glucose, UA: NEGATIVE mg/dL
Hgb urine dipstick: NEGATIVE
Ketones, ur: NEGATIVE mg/dL
Leukocytes,Ua: NEGATIVE
Nitrite: NEGATIVE
Protein, ur: NEGATIVE mg/dL
Specific Gravity, Urine: >1.046 — ABNORMAL HIGH (ref 1.005–1.030)
pH: 5 (ref 5.0–8.0)

## 2020-07-31 LAB — CBC WITH DIFFERENTIAL/PLATELET
Abs Immature Granulocytes: 0.25 10*3/uL — ABNORMAL HIGH (ref 0.00–0.07)
Basophils Absolute: 0.1 10*3/uL (ref 0.0–0.1)
Basophils Relative: 1 %
Eosinophils Absolute: 0.1 10*3/uL (ref 0.0–0.5)
Eosinophils Relative: 1 %
HCT: 38.3 % — ABNORMAL LOW (ref 39.0–52.0)
Hemoglobin: 12.1 g/dL — ABNORMAL LOW (ref 13.0–17.0)
Immature Granulocytes: 2 %
Lymphocytes Relative: 20 %
Lymphs Abs: 2.6 10*3/uL (ref 0.7–4.0)
MCH: 25.4 pg — ABNORMAL LOW (ref 26.0–34.0)
MCHC: 31.6 g/dL (ref 30.0–36.0)
MCV: 80.3 fL (ref 80.0–100.0)
Monocytes Absolute: 2 10*3/uL — ABNORMAL HIGH (ref 0.1–1.0)
Monocytes Relative: 15 %
Neutro Abs: 7.9 10*3/uL — ABNORMAL HIGH (ref 1.7–7.7)
Neutrophils Relative %: 61 %
Platelets: 507 10*3/uL — ABNORMAL HIGH (ref 150–400)
RBC: 4.77 MIL/uL (ref 4.22–5.81)
RDW: 19.7 % — ABNORMAL HIGH (ref 11.5–15.5)
WBC: 12.9 10*3/uL — ABNORMAL HIGH (ref 4.0–10.5)
nRBC: 0 % (ref 0.0–0.2)

## 2020-07-31 LAB — COMPREHENSIVE METABOLIC PANEL
ALT: 13 U/L (ref 0–44)
AST: 20 U/L (ref 15–41)
Albumin: 2.9 g/dL — ABNORMAL LOW (ref 3.5–5.0)
Alkaline Phosphatase: 101 U/L (ref 38–126)
Anion gap: 8 (ref 5–15)
BUN: 16 mg/dL (ref 6–20)
CO2: 25 mmol/L (ref 22–32)
Calcium: 9 mg/dL (ref 8.9–10.3)
Chloride: 103 mmol/L (ref 98–111)
Creatinine, Ser: 0.66 mg/dL (ref 0.61–1.24)
GFR, Estimated: >60 mL/min (ref >60)
Glucose, Bld: 82 mg/dL (ref 70–99)
Potassium: 3.6 mmol/L (ref 3.5–5.1)
Sodium: 136 mmol/L (ref 135–145)
Total Bilirubin: 0.5 mg/dL (ref 0.3–1.2)
Total Protein: 7.1 g/dL (ref 6.5–8.1)

## 2020-07-31 LAB — RESP PANEL BY RT-PCR (FLU A&B, COVID) ARPGX2
Influenza A by PCR: NEGATIVE
Influenza B by PCR: NEGATIVE
SARS Coronavirus 2 by RT PCR: NEGATIVE

## 2020-07-31 LAB — LIPASE, BLOOD: Lipase: 25 U/L (ref 11–51)

## 2020-07-31 LAB — LACTIC ACID, PLASMA: Lactic Acid, Venous: 1.5 mmol/L (ref 0.5–1.9)

## 2020-07-31 MED ORDER — SODIUM CHLORIDE 0.9 % IV SOLN
1.0000 g | Freq: Once | INTRAVENOUS | Status: AC
  Administered 2020-07-31: 1 g via INTRAVENOUS
  Filled 2020-07-31: qty 10

## 2020-07-31 MED ORDER — SODIUM CHLORIDE 0.9 % IV SOLN
2.0000 g | INTRAVENOUS | Status: DC
  Administered 2020-08-01 – 2020-08-02 (×2): 2 g via INTRAVENOUS
  Filled 2020-07-31 (×3): qty 20

## 2020-07-31 MED ORDER — SODIUM CHLORIDE 0.9 % IV SOLN
500.0000 mg | INTRAVENOUS | Status: DC
  Administered 2020-08-01: 500 mg via INTRAVENOUS
  Filled 2020-07-31: qty 500

## 2020-07-31 MED ORDER — ASPIRIN EC 81 MG PO TBEC
81.0000 mg | DELAYED_RELEASE_TABLET | Freq: Two times a day (BID) | ORAL | Status: DC
  Administered 2020-08-01 – 2020-08-03 (×6): 81 mg via ORAL
  Filled 2020-07-31 (×7): qty 1

## 2020-07-31 MED ORDER — SODIUM CHLORIDE 0.9 % IV BOLUS
500.0000 mL | Freq: Once | INTRAVENOUS | Status: AC
  Administered 2020-07-31: 500 mL via INTRAVENOUS

## 2020-07-31 MED ORDER — LORAZEPAM 1 MG PO TABS: 1.0000 mg | ORAL_TABLET | ORAL | Status: DC | PRN

## 2020-07-31 MED ORDER — ONDANSETRON HCL 4 MG/2ML IJ SOLN
4.0000 mg | Freq: Four times a day (QID) | INTRAMUSCULAR | Status: DC | PRN
  Administered 2020-08-01: 4 mg via INTRAVENOUS
  Filled 2020-07-31: qty 2

## 2020-07-31 MED ORDER — IOHEXOL 350 MG/ML SOLN
100.0000 mL | Freq: Once | INTRAVENOUS | Status: AC | PRN
  Administered 2020-07-31: 54 mL via INTRAVENOUS

## 2020-07-31 MED ORDER — POLYETHYLENE GLYCOL 3350 17 G PO PACK
17.0000 g | PACK | Freq: Every day | ORAL | Status: DC | PRN
  Administered 2020-08-02: 17 g via ORAL
  Filled 2020-07-31: qty 1

## 2020-07-31 MED ORDER — SODIUM CHLORIDE 0.45 % IV SOLN: INTRAVENOUS | Status: AC

## 2020-07-31 MED ORDER — ACETAMINOPHEN 650 MG RE SUPP: 650.0000 mg | Freq: Four times a day (QID) | RECTAL | Status: DC | PRN

## 2020-07-31 MED ORDER — DIVALPROEX SODIUM 125 MG PO CSDR
500.0000 mg | DELAYED_RELEASE_CAPSULE | Freq: Two times a day (BID) | ORAL | Status: DC
  Administered 2020-08-01 – 2020-08-03 (×7): 500 mg via ORAL
  Filled 2020-07-31 (×7): qty 4

## 2020-07-31 MED ORDER — ACETAMINOPHEN 325 MG PO TABS
650.0000 mg | ORAL_TABLET | Freq: Four times a day (QID) | ORAL | Status: DC | PRN
  Administered 2020-08-01 – 2020-08-03 (×4): 650 mg via ORAL
  Filled 2020-07-31 (×4): qty 2

## 2020-07-31 MED ORDER — OXYCODONE HCL 5 MG PO TABS
5.0000 mg | ORAL_TABLET | Freq: Four times a day (QID) | ORAL | Status: DC | PRN
  Administered 2020-08-01: 5 mg via ORAL
  Filled 2020-07-31: qty 1

## 2020-07-31 MED ORDER — PANTOPRAZOLE SODIUM 40 MG PO TBEC
40.0000 mg | DELAYED_RELEASE_TABLET | Freq: Every day | ORAL | Status: DC
  Administered 2020-08-01 – 2020-08-03 (×3): 40 mg via ORAL
  Filled 2020-07-31 (×3): qty 1

## 2020-07-31 MED ORDER — TAMSULOSIN HCL 0.4 MG PO CAPS
0.4000 mg | ORAL_CAPSULE | Freq: Every day | ORAL | Status: DC
  Administered 2020-08-01 – 2020-08-03 (×3): 0.4 mg via ORAL
  Filled 2020-07-31 (×3): qty 1

## 2020-07-31 MED ORDER — ENOXAPARIN SODIUM 40 MG/0.4ML ~~LOC~~ SOLN
40.0000 mg | SUBCUTANEOUS | Status: DC
  Administered 2020-08-01 – 2020-08-03 (×4): 40 mg via SUBCUTANEOUS
  Filled 2020-07-31 (×4): qty 0.4

## 2020-07-31 MED ORDER — RISPERIDONE 1 MG PO TABS
2.0000 mg | ORAL_TABLET | Freq: Every day | ORAL | Status: DC
  Administered 2020-08-01 – 2020-08-03 (×4): 2 mg via ORAL
  Filled 2020-07-31 (×4): qty 2

## 2020-07-31 MED ORDER — BACLOFEN 20 MG PO TABS
20.0000 mg | ORAL_TABLET | Freq: Three times a day (TID) | ORAL | Status: DC
  Administered 2020-08-01 – 2020-08-03 (×10): 20 mg via ORAL
  Filled 2020-07-31 (×10): qty 1

## 2020-07-31 MED ORDER — SODIUM CHLORIDE 0.9 % IV SOLN
500.0000 mg | Freq: Once | INTRAVENOUS | Status: AC
  Administered 2020-07-31: 500 mg via INTRAVENOUS
  Filled 2020-07-31: qty 500

## 2020-07-31 MED ORDER — ONDANSETRON HCL 4 MG PO TABS: 4.0000 mg | ORAL_TABLET | Freq: Four times a day (QID) | ORAL | Status: DC | PRN

## 2020-07-31 NOTE — H&P (Signed)
[facility] History and Physical  [name] ZOX:[id number] DOB: 01/17/1961 DOA: 07/31/2020   PCP: Har[place], MD  Specialists: Recently seen by Dr. Victorino Dike and underwent right below-knee amputation on 3/3  Chief Complaint: Cough, nausea and vomiting  HPI: [name] is a 60 y.o. male with a past medical history of cerebral palsy, intellectual disability, history of microcytic anemia, anxiety, bipolar disorder, constipation, decubitus ulcers who recently underwent right below-knee amputation on 3/3 for chronic right foot ulcer with underlying osteomyelitis that failed nonoperative treatment.  Patient lives in group home and was brought in from there with complains of a cough ongoing for 2 days and development of nausea and vomiting this morning.  Patient with cerebral palsy and is a poor historian.  He denies any chest pain currently.  He does admit to abdominal pain in the lower abdomen.  His nausea vomiting started only after onset of his cough.    In the emergency department patient underwent chest x-ray which raised concern for pneumonia in the right lung.  Due to recent surgery he underwent a CT angiogram which did not show any PE.  He was found to have leukocytosis.  He was thought to be more lethargic than usual.  Due to nausea vomiting he is unable to take oral medications at this time.  He will need to be hospitalized for further management.  Home Medications: Prior to Admission medications   Medication Sig Start Date End Date Taking? Authorizing Provider  acetaminophen (TYLENOL) 500 MG tablet Take 500 mg by mouth every 6 (six) hours as needed for moderate pain.   Yes [provider]  albuterol (VENTOLIN HFA) 108 (90 Base) MCG/ACT inhaler Inhale 2 puffs into the lungs every 4 (four) hours as needed for shortness of breath or wheezing. 01/11/20  Yes [provider]  alum & mag hydroxide-simeth (MAALOX/MYLANTA) 200-200-20 MG/5ML suspension Take 30 mLs  by mouth every 6 (six) hours as needed for indigestion or heartburn. Use as directed as needed for nausea/indigestion per standing order   Yes [provider]  aspirin EC 81 MG tablet Take 1 tablet (81 mg total) by mouth 2 (two) times daily. 07/27/20  Yes Jacinta Shoe, PA-C  baclofen (LIORESAL) 20 MG tablet Take 1 tablet (20 mg total) by mouth 3 (three) times daily. 07/18/16  Yes Kirsteins, Victorino Sparrow, MD  brompheniramine-pseudoephedrine (DIMETAPP) 1-15 MG/5ML ELIX Take 10 mLs by mouth daily as needed (Cold sympton).    Yes [provider]  cetirizine (ZYRTEC) 10 MG tablet Take 1 tablet (10 mg total) by mouth daily. Patient taking differently: Take 10 mg by mouth at bedtime. 11/01/14  Yes Street, Stephanie Coup, MD  Cholecalciferol (VITAMIN D3) 5000 units CAPS Take 5,000 Units by mouth daily.   Yes [provider]  clotrimazole (LOTRIMIN) 1 % cream Apply 1 application topically 2 (two) times daily.   Yes [provider]  divalproex (DEPAKOTE SPRINKLE) 125 MG capsule Take 500 mg by mouth 2 (two) times daily.    Yes [provider]  Elastic Bandages & Supports (JOBST ANTI-EM KNEE HIGH MED) MISC 1 each by Does not apply route daily. Apply once in the morning and remove at bedtime daily   Yes [provider]  fluticasone (FLONASE) 50 MCG/ACT nasal spray Place 1 spray into both nostrils daily. 12/21/19  Yes [provider]  Foot Care Products (PREVALON HEEL PROTECTOR) MISC 1 Device by Does not apply route daily. 06/20/15  Yes Marquette Saa,  MD  guaiFENesin-dextromethorphan (ROBITUSSIN DM) 100-10 MG/5ML syrup Take 10 mLs by mouth every 4 (four) hours as needed for cough.    Yes [provider]  hydrocortisone cream 1 % APPLY TOPICALLY AT BEDTIME AS NEEDED FOR ITCHING Patient taking differently: Apply 1 application topically at bedtime. Face 05/29/20  Yes Mullis, Kiersten P, DO  iron polysaccharides (NIFEREX) 150 MG capsule Take  150 mg by mouth daily at 12 noon.    Yes [provider]  ketoconazole (NIZORAL) 2 % shampoo APPLY TOPICALLY TO AFFECTED AREA(S) TWICE WEEKLY AS DIRECTED Patient taking differently: Apply 1 application topically 2 (two) times a week. 12/22/19  Yes Mullis, Kiersten P, DO  LORazepam (ATIVAN) 2 MG tablet Take 1 mg by mouth every 4 (four) hours as needed for anxiety.    Yes [provider]  mupirocin ointment (BACTROBAN) 2 % Apply 1 application topically daily as needed (wound care).   Yes [provider]  Neomycin-Bacitracin-Polymyxin (TRIPLE ANTIBIOTIC EX) Apply 1 application topically daily as needed (cuts/scrapes/scratches).   Yes [provider]  omeprazole (PRILOSEC) 40 MG capsule Take 1 capsule (40 mg total) by mouth 2 (two) times daily. 08/15/18  Yes Rodolph Bong, MD  oxyCODONE (OXY IR/ROXICODONE) 5 MG immediate release tablet Take 5 mg by mouth every 6 (six) hours as needed for severe pain.   Yes [provider]  Ethelda Chick (OYSTER CALCIUM) 500 MG TABS tablet Take 1,500 mg of elemental calcium by mouth daily.   Yes [provider]  polyethylene glycol (MIRALAX) 17 g packet Take 17 g by mouth daily as needed for moderate constipation. Patient taking differently: Take 17 g by mouth daily as needed for moderate constipation. Clearlax power 10/09/19  Yes Kyle, Tyrone A, DO  risperiDONE (RISPERDAL) 2 MG tablet Take 2 mg by mouth at bedtime.   Yes [provider]  tamsulosin (FLOMAX) 0.4 MG CAPS capsule TAKE 1 CAPSULE BY MOUTH EVERY DAY *DO NOT CRUSH* Patient taking differently: Take 0.4 mg by mouth daily. 06/19/20  Yes Mullis, Kiersten P, DO  tolterodine (DETROL LA) 2 MG 24 hr capsule Take 1 capsule (2 mg total) by mouth daily. 04/08/13  Yes Street, Stephanie Coup, MD  tretinoin (RETIN-A) 0.025 % gel Apply 1 application topically at bedtime. Apply topically to face area   Yes [provider]  vitamin C (VITAMIN C) 250 MG  tablet Take 1 tablet (250 mg total) by mouth daily. 01/18/16  Yes Palma Holter, MD  VOLTAREN 1 % GEL APPLY 4 GRAMS TOPICALLY TO AFFECTED AREA(S) FOUR TIMES DAILY Patient taking differently: Apply 4 g topically 4 (four) times daily. 06/16/19  Yes Mullis, Kiersten P, DO  Zinc Oxide 40 % PSTE Apply 1 application topically 2 (two) times daily as needed (unspecified). Desitin Max strength paste apply thin layer topically to buttocks and scrotum 06/07/20  Yes McDiarmid, Leighton Roach, MD    Allergies:  Allergies  Allergen Reactions  . Adhesive [Tape] Rash    Past Medical History: Past Medical History:  Diagnosis Date  . Anemia    microcytic anemia   . Anxiety    takes Ativan daily as needed  . Bipolar disorder (HCC)    takes Depakote daily  . CEREBRAL PALSY 07/24/2006  . Chronic bronchitis (HCC)    "gets it q yr"  . Chronic bronchitis (HCC)   . 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)   . Constipation    takes Miralax daily as needed  .  Depression    takes Risperdal nightly  . Environmental allergies    takes Zyrtec daily  . GASTRIC ULCER ACUTE WITH HEMORRHAGE 07/24/2006  . Gastric ulcer with hemorrhage    hx of   . GERD (gastroesophageal reflux disease)    takes Pravacid daily  . History of blood transfusion    no abnormal reaction noted  . History of hiatal hernia   . History of shingles   . History of stomach ulcers ~ 1996  . Hypercholesterolemia   . Intellectual functioning disability   . Intermittent explosive disorder   . Internal hemorrhoids   . MRSA infection 09/06/2019  . Obsessive compulsive disorder   . Osteomyelitis (HCC)    right foot   . Pneumonia "quite a few times"   in 2015 and in June 2016  . Pressure ulcer of sacral region    hx of   . Pseudomonas aeruginosa infection 09/06/2019  . Psychotic disorder (HCC)   . Recurrent UTI (urinary tract infection)   . Seborrheic dermatitis   . Sigmoid diverticulosis   . Spastic tetraplegia  (HCC)   . URINARY INCONTINENCE 02/09/2010   takes Detrol daily-occasionally incontinence  . Vitamin D deficiency   . Weakness    numbness in extremities    Past Surgical History:  Procedure Laterality Date  . AMPUTATION Right 12/14/2014   Procedure: AMPUTATION DIGIT RIGHT GREAT TOE MTP;  Surgeon: Nadara Mustard, MD;  Location: [facility] OR;  Service: Orthopedics;  Laterality: Right;  . AMPUTATION Right 10/08/2019   Procedure: RIGHT FIFTH RAY AMPUTATION, ADJACENT TISSUE TRANSFER;  Surgeon: Park Liter, DPM;  Location: [facility];  Service: Podiatry;  Laterality: Right;  . AMPUTATION Right 07/27/2020   Procedure: AMPUTATION BELOW KNEE;  Surgeon: Toni Arthurs, MD;  Location: [facility] OR;  Service: Orthopedics;  Laterality: Right;   . BIOPSY  08/12/2018   Procedure: BIOPSY;  Surgeon: Lynann Bologna, MD;  Location: [facility];  Service: Gastroenterology;;  . COLONOSCOPY WITH PROPOFOL N/A 08/14/2018   Procedure: COLONOSCOPY WITH PROPOFOL;  Surgeon: Lynann Bologna, MD;  Location: [facility];  Service: Gastroenterology;  Laterality: N/A;  . ESOPHAGOGASTRODUODENOSCOPY (EGD) WITH PROPOFOL N/A 08/12/2018   Procedure: ESOPHAGOGASTRODUODENOSCOPY (EGD) WITH PROPOFOL;  Surgeon: Lynann Bologna, MD;  Location: [facility];  Service: Gastroenterology;  Laterality: N/A;  . GRAFT APPLICATION Right 11/12/2019   Procedure: SKIN GRAFT SUBSTITUTE APPLICATION;  Surgeon: Park Liter, DPM;  Location: [facility];  Service: Podiatry;  Laterality: Right;  . HERNIA REPAIR    . HIATAL HERNIA REPAIR  1982  . LEG SURGERY Bilateral ~ 1967   "legs were scissored; stretched them out"  . WOUND DEBRIDEMENT Right 11/12/2019   Procedure: DEBRIDEMENT WOUND;  Surgeon: Park Liter, DPM;  Location: [facility];  Service: Podiatry;  Laterality: Right;    Social History: Lives in a group home.  No history of smoking alcohol use.  Limited history due to mental disability.  Family History:  Family History  Adopted: Yes  Unable to do due to  his altered mental status  Review of Systems -difficult to do due to mental disability  Physical Examination  Vitals:   07/31/20 1400 07/31/20 1500 07/31/20 1600 07/31/20 1700  BP: 106/61 (!) 114/57 114/65 114/65  Pulse: 82 80 86 86  Resp: 19 18 18 18   Temp:      TempSrc:      SpO2: 98% 98% 96% 96%    BP 114/65   Pulse 86   Temp 99 F (37.2  C) (Oral)   Resp 18   SpO2 96%   General appearance: alert, distracted and no distress Head: Normocephalic, without obvious abnormality, atraumatic Throat: Dry mucous membranes noted Neck: no adenopathy, no carotid bruit, no JVD, supple, symmetrical, trachea midline and thyroid not enlarged, symmetric, no tenderness/mass/nodules Resp: Diminished air entry at the right base with few crackles.  No wheezing or rhonchi appreciated. Cardio: regular rate and rhythm, S1, S2 normal, no murmur, click, rub or gallop GI: Abdomen is soft.  Tender mainly in the suprapubic area without any rebound rigidity or guarding.  No obvious masses organomegaly appreciated. Extremities: Contracted lower extremities noted. Pulses: 2+ and symmetric Skin: Patient's right leg is covered in a dressing which is supposed to stay on until 2 weeks from 3/3.  He also has a dressing over his left lower leg.  He is got dressing over his lower back as well. Lymph nodes: Cervical, supraclavicular, and axillary nodes normal. Neurologic: He is awake alert.  Oriented to person.  He has cerebral palsy.  Limited mobility of his arms and legs.  No other deficits noted.   Labs on Admission: I have personally reviewed following labs and imaging studies  CBC: Recent Labs  Lab 07/27/20 0913 07/27/20 1053 07/31/20 1204  WBC 8.2  --  12.9*  NEUTROABS  --   --  7.9*  HGB 12.8* 13.9 12.1*  HCT 39.6 41.0 38.3*  MCV 79.8*  --  80.3  PLT 547*  --  507*   Basic Metabolic Panel: Recent Labs  Lab 07/27/20 0913 07/27/20 1053 07/31/20 1204  NA 138 141 136  K 4.1 4.5 3.6  CL 104  107 103  CO2 23  --  25  GLUCOSE 79 85 82  BUN 9 10 16   CREATININE 0.53* 0.50* 0.66  CALCIUM 8.9  --  9.0   GFR: Estimated Creatinine Clearance: 88.9 mL/min (by C-G formula based on SCr of 0.66 mg/dL). Liver Function Tests: Recent Labs  Lab 07/31/20 1204  AST 20  ALT 13  ALKPHOS 101  BILITOT 0.5  PROT 7.1  ALBUMIN 2.9*   Recent Labs  Lab 07/31/20 1204  LIPASE 25     Radiological Exams on Admission: CT Angio Chest PE W and/or Wo Contrast  Result Date: 07/31/2020 CLINICAL DATA:  Shortness of breath, cough, pneumonia versus chest pain EXAM: CT ANGIOGRAPHY CHEST WITH CONTRAST TECHNIQUE: Multidetector CT imaging of the chest was performed using the standard protocol during bolus administration of intravenous contrast. Multiplanar CT image reconstructions and MIPs were obtained to evaluate the vascular anatomy. CONTRAST:  54mL OMNIPAQUE IOHEXOL 350 MG/ML SOLN COMPARISON:  CT chest dated 03/13/2014 FINDINGS: Cardiovascular: Satisfactory opacification the pulmonary arteries to the lobar level. No evidence of pulmonary embolism. Although not tailored for evaluation of the thoracic aorta, there is no evidence of thoracic aortic aneurysm or dissection. The heart is normal in size.  No pericardial effusion. Mild coronary atherosclerosis of the LAD. Mediastinum/Nodes: No suspicious mediastinal lymphadenopathy. Visualized thyroid is unremarkable. Lungs/Pleura: Evaluation of the lung parenchyma is constrained by respiratory motion. Moderate right and trace left pleural effusions. Mild patchy opacity/atelectasis in the right upper, middle, and lower lobes. No frank interstitial edema. No suspicious pulmonary nodules. No pneumothorax. Upper Abdomen: Visualized upper abdomen is notable for a large hiatal hernia with postsurgical changes at the GE junction. Musculoskeletal: Degenerative changes of the visualized thoracolumbar spine. Review of the MIP images confirms the above findings. IMPRESSION: No  evidence of pulmonary embolism. Moderate right and trace left  pleural effusions. No frank interstitial edema. Mild patchy opacity/atelectasis in the right lung, as above. Large hiatal hernia with postsurgical changes at the GE junction. Electronically Signed   By: Charline Bills M.D.   On: 07/31/2020 15:01   DG Chest Portable 1 View  Result Date: 07/31/2020 CLINICAL DATA:  Chest pain and cough. EXAM: PORTABLE CHEST 1 VIEW COMPARISON:  April 08, 2019 FINDINGS: The study is limited secondary to difficult patient positioning. Mild to moderate severity airspace disease is suspected within the right lung base with a small right pleural effusion. The left lung is clear. No pneumothorax is identified. The heart size and mediastinal contours are within normal limits. There is a large gastric hernia with radiopaque surgical clips seen just below the expected region of the esophageal hiatus. Degenerative changes seen within the thoracic spine. IMPRESSION: 1. Limited study with suspected right basilar airspace disease and a small right pleural effusion. 2. Large gastric hernia. Electronically Signed   By: Aram Candela M.D.   On: 07/31/2020 11:31       Problem List  Active Problems:   CAP (community acquired pneumonia)   Cerebral palsy, quadriplegic (HCC)   Nausea and vomiting   Assessment: This is a 59 year old Caucasian male with past medical history of cerebral palsy who comes in with 1 to 2-day history of worsening cough and then onset of nausea and vomiting today.  He is found to have pneumonia along with what appears to be a parapneumonic effusion in the right lung.  Could be aspiration pneumonia but his onset of nausea vomiting was after he developed a cough.  However patient did have surgery last week.  Community-acquired pneumonia is another possibility.  Plan:  1. Community-acquired versus aspiration pneumonia/right-sided pleural effusion: He will be continued on ceftriaxone and  azithromycin.  He is currently not having any oxygen requirements.  However he does have nausea vomiting and so will be unable to take oral medicines at this time.  We will have speech therapy evaluate his swallow function.  Aspiration precautions.  No evidence for sepsis at this time.  2.  Nausea and vomiting/possible urinary retention: Unclear etiology.  He is tender in the suprapubic area.  Bladder scan will be done to rule out urinary retention.  He has had urinary retention previously.  Otherwise his abdomen appears to be benign.  His LFTs are normal.  His lipase is normal.  3.  History of cerebral palsy with anxiety disorder and bipolar disorder: Noted to be on anxiolytic agents in the form of Ativan.  Also on Depakote which will be continued.  Patient is nonambulatory and gets around in a wheelchair.  4. Multiple decubitus ulcers: We will request wound care to evaluate.  5.  Recent right below-knee amputation: Right leg is still in dressing.  According to his family member this is supposed to stay on until follow-up with Dr. Victorino Dike in 2 weeks.  6. History of chronic anemia/microcytic anemia: Noted to be on iron supplements at home.  Hemoglobin noted to be stable.   DVT Prophylaxis: Lovenox Code Status: Full code Family Communication: Discussed with the sister Disposition: Hopefully return back to his group home in improved Consults called: None at this time Admission Status: Status is: Inpatient  Remains inpatient appropriate because:IV treatments appropriate due to intensity of illness or inability to take PO and Inpatient level of care appropriate due to severity of illness   Dispo: The patient is from: Group home  Anticipated d/c is to: Group home              Patient currently is not medically stable to d/c.   Difficult to place patient No    Severity of Illness: The appropriate patient status for this patient is INPATIENT. Inpatient status is judged to be  reasonable and necessary in order to provide the required intensity of service to ensure the patient's safety. The patient's presenting symptoms, physical exam findings, and initial radiographic and laboratory data in the context of their chronic comorbidities is felt to place them at high risk for further clinical deterioration. Furthermore, it is not anticipated that the patient will be medically stable for discharge from the hospital within 2 midnights of admission. The following factors support the patient status of inpatient.   " The patient's presenting symptoms include cough, nausea and vomiting. " The worrisome physical exam findings include abdominal tenderness. " The initial radiographic and laboratory data are worrisome because of pneumonia in the right lung. " The chronic co-morbidities include cerebral palsy.   * I certify that at the point of admission it is my clinical judgment that the patient will require inpatient hospital care spanning beyond 2 midnights from the point of admission due to high intensity of service, high risk for further deterioration and high frequency of surveillance required.*    Further management decisions will depend on results of further testing and patient's response to treatment.   Nekhi Liwanag [employer]  [facility] [employer] on [email or web address]  07/31/2020, 5:58 PM

## 2020-07-31 NOTE — ED Triage Notes (Signed)
EMS reports from group home ([address] [place]) called out for chest pain and cough since last night. Hx of Cerebral palsy and at baseline. Staff states recent DX of PNA. Pt DCd from [facility] 3/3 post Right BKA.   BP 104/64 HR 90 RR 18 Sp02 95 RA CBG 233

## 2020-07-31 NOTE — ED Notes (Signed)
Care assumed. Report received from John, RN 

## 2020-07-31 NOTE — ED Provider Notes (Signed)
[facility]-EMERGENCY DEPT Provider Note   CSN: [id number] Arrival date & time: 07/31/20  1023     History No chief complaint on file.   RAYMONT ANDREONI is a 60 y.o. male.  The history is provided by the patient and medical records. No language interpreter was used.  Fever Max temp prior to arrival:  101 Temp source:  Oral Severity:  Moderate Onset quality:  Gradual Duration:  1 day Timing:  Constant Progression:  Unchanged Chronicity:  New Relieved by:  Nothing Worsened by:  Nothing Ineffective treatments:  None tried Associated symptoms: chest pain, chills, cough and dysuria   Associated symptoms: no confusion, no congestion, no diarrhea, no headaches, no nausea, no rhinorrhea and no vomiting        Past Medical History:  Diagnosis Date  . Anemia    microcytic anemia   . Anxiety    takes Ativan daily as needed  . Bipolar disorder (HCC)    takes Depakote daily  . CEREBRAL PALSY 07/24/2006  . Chronic bronchitis (HCC)    "gets it q yr"  . Chronic bronchitis (HCC)   . 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)   . Constipation    takes Miralax daily as needed  . Depression    takes Risperdal nightly  . Environmental allergies    takes Zyrtec daily  . GASTRIC ULCER ACUTE WITH HEMORRHAGE 07/24/2006  . Gastric ulcer with hemorrhage    hx of   . GERD (gastroesophageal reflux disease)    takes Pravacid daily  . History of blood transfusion    no abnormal reaction noted  . History of hiatal hernia   . History of shingles   . History of stomach ulcers ~ 1996  . Hypercholesterolemia   . Intellectual functioning disability   . Intermittent explosive disorder   . Internal hemorrhoids   . MRSA infection 09/06/2019  . Obsessive compulsive disorder   . Osteomyelitis (HCC)    right foot   . Pneumonia "quite a few times"   in 2015 and in June 2016  . Pressure ulcer of sacral region    hx of   . Pseudomonas aeruginosa  infection 09/06/2019  . Psychotic disorder (HCC)   . Recurrent UTI (urinary tract infection)   . Seborrheic dermatitis   . Sigmoid diverticulosis   . Spastic tetraplegia (HCC)   . URINARY INCONTINENCE 02/09/2010   takes Detrol daily-occasionally incontinence  . Vitamin D deficiency   . Weakness    numbness in extremities    Patient Active Problem List   Diagnosis Date Noted  . 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 Osteomyelitis (HCC) 10/08/2019  . Acute osteomyelitis of right ankle or foot (HCC)   . Ulcerated, foot, right, with necrosis of bone (HCC)   . Pseudomonas aeruginosa infection 09/06/2019  . MRSA infection 09/06/2019  . Lactic acidosis   . Pressure injury of skin 08/10/2018  . Gastric wall thickening 08/10/2018  . Alkaline phosphatase elevation 08/10/2018  . Cholelithiasis 08/10/2018  . Microcytic anemia   . Hiatal hernia 04/07/2018  . 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 Quadriplegia (HCC) 12/15/2015  . Cerebral palsy, quadriplegic (HCC)   . Pre-diabetes 08/31/2015  . Foot ulcer, limited to breakdown of skin (HCC) 06/08/2015  . Seasonal allergies 11/01/2014  . Pressure ulcer of foot 03/14/2014  . Pressure ulcer 01/08/2013  . URINARY INCONTINENCE 02/09/2010  . Mental retardation 07/24/2006  .  Infantile cerebral palsy (HCC) 07/24/2006    Past Surgical History:  Procedure Laterality Date  . AMPUTATION Right 12/14/2014   Procedure: AMPUTATION DIGIT RIGHT GREAT TOE MTP;  Surgeon: Nadara Mustard, MD;  Location: [facility] OR;  Service: Orthopedics;  Laterality: Right;  . AMPUTATION Right 10/08/2019   Procedure: RIGHT FIFTH RAY AMPUTATION, ADJACENT TISSUE TRANSFER;  Surgeon: Park Liter, DPM;  Location: [facility];  Service: Podiatry;  Laterality: Right;  . AMPUTATION Right 07/27/2020   Procedure: AMPUTATION BELOW KNEE;  Surgeon: Toni Arthurs, MD;  Location: [facility] OR;  Service: Orthopedics;  Laterality: Right;   . BIOPSY   08/12/2018   Procedure: BIOPSY;  Surgeon: Lynann Bologna, MD;  Location: [facility];  Service: Gastroenterology;;  . COLONOSCOPY WITH PROPOFOL N/A 08/14/2018   Procedure: COLONOSCOPY WITH PROPOFOL;  Surgeon: Lynann Bologna, MD;  Location: [facility];  Service: Gastroenterology;  Laterality: N/A;  . ESOPHAGOGASTRODUODENOSCOPY (EGD) WITH PROPOFOL N/A 08/12/2018   Procedure: ESOPHAGOGASTRODUODENOSCOPY (EGD) WITH PROPOFOL;  Surgeon: Lynann Bologna, MD;  Location: [facility];  Service: Gastroenterology;  Laterality: N/A;  . GRAFT APPLICATION Right 11/12/2019   Procedure: SKIN GRAFT SUBSTITUTE APPLICATION;  Surgeon: Park Liter, DPM;  Location: [facility];  Service: Podiatry;  Laterality: Right;  . HERNIA REPAIR    . HIATAL HERNIA REPAIR  1982  . LEG SURGERY Bilateral ~ 1967   "legs were scissored; stretched them out"  . WOUND DEBRIDEMENT Right 11/12/2019   Procedure: DEBRIDEMENT WOUND;  Surgeon: Park Liter, DPM;  Location: [facility];  Service: Podiatry;  Laterality: Right;       Family History  Adopted: Yes    Social History   Tobacco Use  . Smoking status: Never Smoker  . Smokeless tobacco: Never Used  Substance Use Topics  . Alcohol use: No  . Drug use: No    Home Medications Prior to Admission medications   Medication Sig Start Date End Date Taking? Authorizing Provider  acetaminophen (TYLENOL) 500 MG tablet Take 500 mg by mouth every 6 (six) hours as needed for moderate pain.    [provider]  albuterol (VENTOLIN HFA) 108 (90 Base) MCG/ACT inhaler Inhale 2 puffs into the lungs every 4 (four) hours as needed for shortness of breath or wheezing. 01/11/20   [provider]  alum & mag hydroxide-simeth (MAALOX/MYLANTA) 200-200-20 MG/5ML suspension Take 30 mLs by mouth every 6 (six) hours as needed for indigestion or heartburn. Use as directed as needed for nausea/indigestion per standing order    [provider]  aspirin EC 81 MG tablet Take 1 tablet (81  mg total) by mouth 2 (two) times daily. 07/27/20   Jacinta Shoe, PA-C  baclofen (LIORESAL) 20 MG tablet Take 1 tablet (20 mg total) by mouth 3 (three) times daily. 07/18/16   Kirsteins, Victorino Sparrow, MD  brompheniramine-pseudoephedrine (DIMETAPP) 1-15 MG/5ML ELIX Take 10 mLs by mouth daily as needed (Cold sympton).     [provider]  cetirizine (ZYRTEC) 10 MG tablet Take 1 tablet (10 mg total) by mouth daily. Patient taking differently: Take 10 mg by mouth at bedtime. 11/01/14   Street, Stephanie Coup, MD  Cholecalciferol (VITAMIN D3) 5000 units CAPS Take 5,000 Units by mouth daily.    [provider]  clindamycin (CLEOCIN) 300 MG capsule Take 300 mg by mouth every 6 (six) hours.    [provider]  clotrimazole (LOTRIMIN) 1 % cream Apply 1 application topically 2 (two) times daily. Apply to face    [provider]  divalproex (DEPAKOTE SPRINKLE) 125 MG capsule Take 500 mg by mouth 2 (two) times daily.     [provider]  Elastic Bandages & Supports (JOBST ANTI-EM KNEE HIGH MED) MISC by Does not apply route. Apply once in the morning and remove at bedtime daily    [provider]  fluticasone (FLONASE) 50 MCG/ACT nasal spray Place 1 spray into both nostrils daily. 12/21/19   [provider]  Foot Care Products (PREVALON HEEL PROTECTOR) MISC 1 Device by Does not apply route daily. 06/20/15   Marquette SaaLancaster, Abigail Joseph, MD  guaiFENesin-dextromethorphan [facility](ROBITUSSIN DM) 100-10 MG/5ML syrup Take 10 mLs by mouth every 4 (four) hours as needed for cough.     [provider]  hydrocortisone cream 1 % APPLY TOPICALLY AT BEDTIME AS NEEDED FOR ITCHING Patient taking differently: Apply 1 application topically at bedtime. 05/29/20   Mullis, Kiersten P, DO  iron polysaccharides (NIFEREX) 150 MG capsule Take 150 mg by mouth daily at 12 noon.     [provider]  ketoconazole (NIZORAL) 2 % shampoo APPLY TOPICALLY TO AFFECTED AREA(S) TWICE  WEEKLY AS DIRECTED Patient taking differently: Apply 1 application topically 2 (two) times a week. 12/22/19   Mullis, Kiersten P, DO  LORazepam (ATIVAN) 2 MG tablet Take 1 mg by mouth every 4 (four) hours as needed for anxiety.     [provider]  mupirocin ointment (BACTROBAN) 2 % Apply 1 application topically daily as needed (wound care).    [provider]  Neomycin-Bacitracin-Polymyxin (TRIPLE ANTIBIOTIC EX) Apply 1 application topically as needed (cuts/scrapes/scratches).    [provider]  omeprazole (PRILOSEC) 40 MG capsule Take 1 capsule (40 mg total) by mouth 2 (two) times daily. 08/15/18   Rodolph Bonghompson, Daniel V, MD  Oyster Shell (OYSTER CALCIUM) 500 MG TABS tablet Take 1,500 mg of elemental calcium by mouth daily.    [provider]  polyethylene glycol (MIRALAX) 17 g packet Take 17 g by mouth daily as needed for moderate constipation. Patient taking differently: Take 17 g by mouth daily as needed for moderate constipation. Clearlax power 10/09/19   Margie EgeKyle, Tyrone A, DO  risperiDONE (RISPERDAL) 2 MG tablet Take 2 mg by mouth at bedtime.    [provider]  tamsulosin (FLOMAX) 0.4 MG CAPS capsule TAKE 1 CAPSULE BY MOUTH EVERY DAY *DO NOT CRUSH* Patient taking differently: Take 0.4 mg by mouth daily. 06/19/20   Mullis, Kiersten P, DO  tolterodine (DETROL LA) 2 MG 24 hr capsule Take 1 capsule (2 mg total) by mouth daily. Patient taking differently: Take 2 mg by mouth daily at 6 PM. 04/08/13   Street, Stephanie Couphristopher M, MD  tretinoin (RETIN-A) 0.025 % gel Apply 1 application topically at bedtime. Apply topically to face area    [provider]  vitamin C (VITAMIN C) 250 MG tablet Take 1 tablet (250 mg total) by mouth daily. 01/18/16   Palma HolterGunadasa, Kanishka G, MD  VOLTAREN 1 % GEL APPLY 4 GRAMS TOPICALLY TO AFFECTED AREA(S) FOUR TIMES DAILY Patient taking differently: Apply 4 g topically 4 (four) times daily. 06/16/19   Mullis, Kiersten P, DO  Zinc Oxide  40 % PSTE Apply 1 application topically 2 (two) times daily as needed (unspecified). Desitin Max strength paste apply thin layer topically to buttocks and scrotum 06/07/20   McDiarmid, Leighton Roachodd D, MD    Allergies    Adhesive [tape]  Review of Systems   Review of Systems  Constitutional: Positive for chills, fatigue and  fever. Negative for diaphoresis.  HENT: Negative for congestion and rhinorrhea.   Eyes: Negative for visual disturbance.  Respiratory: Positive for cough and shortness of breath. Negative for chest tightness and wheezing.   Cardiovascular: Positive for chest pain. Negative for palpitations and leg swelling.  Gastrointestinal: Negative for abdominal pain, diarrhea, nausea and vomiting.  Genitourinary: Positive for decreased urine volume, difficulty urinating and dysuria.  Musculoskeletal: Negative for back pain, neck pain and neck stiffness.  Neurological: Negative for headaches.  Psychiatric/Behavioral: Negative for agitation and confusion.  All other systems reviewed and are negative.   Physical Exam Updated Vital Signs BP (!) 114/57   Pulse 80   Temp 99 F (37.2 C) (Oral)   Resp 18   SpO2 98%   Physical Exam Vitals and nursing note reviewed.  Constitutional:      General: He is not in acute distress.    Appearance: He is well-developed and well-nourished. He is not ill-appearing, toxic-appearing or diaphoretic.  HENT:     Head: Normocephalic and atraumatic.  Eyes:     Extraocular Movements: Extraocular movements intact.     Conjunctiva/sclera: Conjunctivae normal.     Pupils: Pupils are equal, round, and reactive to light.  Cardiovascular:     Rate and Rhythm: Normal rate and regular rhythm.     Heart sounds: No murmur heard.   Pulmonary:     Effort: Pulmonary effort is normal. No respiratory distress.     Breath sounds: Rhonchi present. No decreased breath sounds, wheezing or rales.  Chest:     Chest wall: No tenderness.  Abdominal:     Palpations:  Abdomen is soft.     Tenderness: There is no abdominal tenderness.  Musculoskeletal:        General: No edema.     Cervical back: Neck supple.  Skin:    General: Skin is warm and dry.     Capillary Refill: Capillary refill takes less than 2 seconds.     Findings: No erythema.  Neurological:     General: No focal deficit present.     Mental Status: He is alert.  Psychiatric:        Mood and Affect: Mood and affect and mood normal.     ED Results / Procedures / Treatments   Labs (all labs ordered are listed, but only abnormal results are displayed) Labs Reviewed  CBC WITH DIFFERENTIAL/PLATELET - Abnormal; Notable for the following components:      Result Value   WBC 12.9 (*)    Hemoglobin 12.1 (*)    HCT 38.3 (*)    MCH 25.4 (*)    RDW 19.7 (*)    Platelets 507 (*)    Neutro Abs 7.9 (*)    Monocytes Absolute 2.0 (*)    Abs Immature Granulocytes 0.25 (*)    All other components within normal limits  COMPREHENSIVE METABOLIC PANEL - Abnormal; Notable for the following components:   Albumin 2.9 (*)    All other components within normal limits  RESP PANEL BY RT-PCR (FLU A&B, COVID) ARPGX2  CULTURE, BLOOD (ROUTINE X 2)  CULTURE, BLOOD (ROUTINE X 2)  URINE CULTURE  LACTIC ACID, PLASMA  LIPASE, BLOOD  LACTIC ACID, PLASMA  URINALYSIS, ROUTINE W REFLEX MICROSCOPIC    EKG None  Radiology CT Angio Chest PE W and/or Wo Contrast  Result Date: 07/31/2020 CLINICAL DATA:  Shortness of breath, cough, pneumonia versus chest pain EXAM: CT ANGIOGRAPHY CHEST WITH CONTRAST TECHNIQUE: Multidetector CT imaging of the chest  was performed using the standard protocol during bolus administration of intravenous contrast. Multiplanar CT image reconstructions and MIPs were obtained to evaluate the vascular anatomy. CONTRAST:  37mL OMNIPAQUE IOHEXOL 350 MG/ML SOLN COMPARISON:  CT chest dated 03/13/2014 FINDINGS: Cardiovascular: Satisfactory opacification the pulmonary arteries to the lobar level. No  evidence of pulmonary embolism. Although not tailored for evaluation of the thoracic aorta, there is no evidence of thoracic aortic aneurysm or dissection. The heart is normal in size.  No pericardial effusion. Mild coronary atherosclerosis of the LAD. Mediastinum/Nodes: No suspicious mediastinal lymphadenopathy. Visualized thyroid is unremarkable. Lungs/Pleura: Evaluation of the lung parenchyma is constrained by respiratory motion. Moderate right and trace left pleural effusions. Mild patchy opacity/atelectasis in the right upper, middle, and lower lobes. No frank interstitial edema. No suspicious pulmonary nodules. No pneumothorax. Upper Abdomen: Visualized upper abdomen is notable for a large hiatal hernia with postsurgical changes at the GE junction. Musculoskeletal: Degenerative changes of the visualized thoracolumbar spine. Review of the MIP images confirms the above findings. IMPRESSION: No evidence of pulmonary embolism. Moderate right and trace left pleural effusions. No frank interstitial edema. Mild patchy opacity/atelectasis in the right lung, as above. Large hiatal hernia with postsurgical changes at the GE junction. Electronically Signed   By: Charline Bills M.D.   On: 07/31/2020 15:01   DG Chest Portable 1 View  Result Date: 07/31/2020 CLINICAL DATA:  Chest pain and cough. EXAM: PORTABLE CHEST 1 VIEW COMPARISON:  April 08, 2019 FINDINGS: The study is limited secondary to difficult patient positioning. Mild to moderate severity airspace disease is suspected within the right lung base with a small right pleural effusion. The left lung is clear. No pneumothorax is identified. The heart size and mediastinal contours are within normal limits. There is a large gastric hernia with radiopaque surgical clips seen just below the expected region of the esophageal hiatus. Degenerative changes seen within the thoracic spine. IMPRESSION: 1. Limited study with suspected right basilar airspace disease and a  small right pleural effusion. 2. Large gastric hernia. Electronically Signed   By: Aram Candela M.D.   On: 07/31/2020 11:31    Procedures Procedures     Medications Ordered in ED Medications  cefTRIAXone (ROCEPHIN) 1 g in sodium chloride 0.9 % 100 mL IVPB (has no administration in time range)  azithromycin (ZITHROMAX) 500 mg in sodium chloride 0.9 % 250 mL IVPB (has no administration in time range)  sodium chloride 0.9 % bolus 500 mL (0 mLs Intravenous Stopped 07/31/20 1431)  iohexol (OMNIPAQUE) 350 MG/ML injection 100 mL (54 mLs Intravenous Contrast Given 07/31/20 1434)    ED Course  I have reviewed the triage vital signs and the nursing notes.  Pertinent labs & imaging results that were available during my care of the patient were reviewed by me and considered in my medical decision making (see chart for details).    MDM Rules/Calculators/A&P                          SRIKAR CHIANG is a 60 y.o. male with a past medical history significant for cerebral palsy, recent right leg BKA for osteomyelitis 4 days ago, prior pneumonias, anxiety, GERD, prior GI hemorrhage, and prior DVT not anticoagulation who presents with new chest pain, shortness of breath, cough, fever up to 101 overnight, difficulty urinating, and fatigue.  Patient reports that he had a right BKA 4 days ago and has not had any increase in pain in his  right leg.  He reports that seems to be doing well.  He reports that last night, he was feeling bad and was found to have a fever 101.  He reports he had some cough with a production of phlegm.  No hematemesis or hemoptysis reported.  He denies significant nausea or vomiting.  He reports some chest tightness and discomfort in his central chest.  It is pleuritic.  He reports no significant headache or neck pain or neck stiffness.  Reports she has had difficulty with urination and has had to actually push his bladder to urinate which is worse than normal for him.  Denies constipation  or diarrhea.  Denies other complaints.  On arrival, patient's blood pressure is soft in the 90s.  He is not febrile but reports he was febrile last night.  He is not tachycardic initially with a rate in the 90s.  He is maintaining oxygen saturations on room air.  On exam, lungs have coarseness bilaterally.  Chest is nontender.  Abdomen is nontender.  Bowel sounds were appreciated.  Patient has a right BKA with a dressing and wrap still in place.  Left leg nontender.  Patient reports his mental status and neurologic exam is at his baseline.  Clinically given his history of DVT, recent surgery, lack of anticoagulation, and his new pleuritic chest pain and shortness of breath, will get CT scan to look for PE.  We will get screening labs.  With his fever will also look for urinalysis given the difficulty urinating and will also get the imaging of his chest to look for pneumonia with a new productive cough and fever.  Given his lack of any worsened pain, lower suspicion for a post operative complication or postoperative infection.  If all work-up is negative, will touch base with the surgical orthopedic team to see if they would like [place] to take down his dressing to have more examination.  We will give a small amount of fluids to help with soft blood pressures initially.  Anticipate reassessment for further management.  3:28 PM Patient reassessed and states he will is having a deep cough with occasional oxygen saturations going to around 90 to 91% on room air.  Blood pressure still around 100 systolic after initial fluids, will give some more fluids.  Patient does have leukocytosis on labs and mild anemia.  Metabolic panel reassuring.  Lactic acid does not show sepsis and lipase not elevated.  Patient still unable to urinate, he will try 1 more time before we go to in and out catheterization.  The sister arrived and thinks he has a UTI as well as pneumonia.  Patient had a CT scan which did not show evidence  of PE but does confirm pneumonia and pleural effusions.  With his fever, overall ill appearance, soft pressures, and pneumonia, will treat with antibiotics and he will be admitted.  We will try to wait for urinalysis to make sure he does not have UTI as well.  5:07 PM Care transferred to Dr. Rodena Medin awaiting for urinalysis and admission for pneumonia   Final Clinical Impression(s) / ED Diagnoses Final diagnoses:  Recurrent pneumonia    Clinical Impression: 1. Recurrent pneumonia     Disposition: Admit  This note was prepared with assistance of Dragon voice recognition software. Occasional wrong-word or sound-a-like substitutions may have occurred due to the inherent limitations of voice recognition software.      Devontaye Ground, Canary Brim, MD 07/31/20 [phone]

## 2020-07-31 NOTE — ED Provider Notes (Signed)
Care continued after prior ED provider.  Case discussed with admitting hospitalist service.  They will evaluate patient for admission.   Wynetta Fines, MD 07/31/20 [phone]

## 2020-07-31 NOTE — Progress Notes (Signed)
Pt's sister states that  manager of group home needs to be called to take him back to the group home upon his discharge. Her name is Brandi Day. Her number is [phone]. Briscoe Burns BSN, RN-BC Admissions RN 07/31/2020 4:55 PM

## 2020-08-01 LAB — COMPREHENSIVE METABOLIC PANEL
ALT: 12 U/L (ref 0–44)
AST: 19 U/L (ref 15–41)
Albumin: 2.6 g/dL — ABNORMAL LOW (ref 3.5–5.0)
Alkaline Phosphatase: 103 U/L (ref 38–126)
Anion gap: 8 (ref 5–15)
BUN: 15 mg/dL (ref 6–20)
CO2: 24 mmol/L (ref 22–32)
Calcium: 9 mg/dL (ref 8.9–10.3)
Chloride: 105 mmol/L (ref 98–111)
Creatinine, Ser: 0.51 mg/dL — ABNORMAL LOW (ref 0.61–1.24)
GFR, Estimated: >60 mL/min (ref >60)
Glucose, Bld: 120 mg/dL — ABNORMAL HIGH (ref 70–99)
Potassium: 4 mmol/L (ref 3.5–5.1)
Sodium: 137 mmol/L (ref 135–145)
Total Bilirubin: 0.6 mg/dL (ref 0.3–1.2)
Total Protein: 6.7 g/dL (ref 6.5–8.1)

## 2020-08-01 LAB — HIV ANTIBODY (ROUTINE TESTING W REFLEX): HIV Screen 4th Generation wRfx: NONREACTIVE

## 2020-08-01 LAB — CBC
HCT: 35.7 % — ABNORMAL LOW (ref 39.0–52.0)
Hemoglobin: 11.4 g/dL — ABNORMAL LOW (ref 13.0–17.0)
MCH: 25.7 pg — ABNORMAL LOW (ref 26.0–34.0)
MCHC: 31.9 g/dL (ref 30.0–36.0)
MCV: 80.4 fL (ref 80.0–100.0)
Platelets: 455 10*3/uL — ABNORMAL HIGH (ref 150–400)
RBC: 4.44 MIL/uL (ref 4.22–5.81)
RDW: 19.2 % — ABNORMAL HIGH (ref 11.5–15.5)
WBC: 10.9 10*3/uL — ABNORMAL HIGH (ref 4.0–10.5)
nRBC: 0 % (ref 0.0–0.2)

## 2020-08-01 LAB — VALPROIC ACID LEVEL: Valproic Acid Lvl: 41 ug/mL — ABNORMAL LOW (ref 50.0–100.0)

## 2020-08-01 NOTE — Progress Notes (Signed)
[facility] PROGRESS NOTE   [name] WGN:[id number] DOB: Aug 18, 1960 DOA: 07/31/2020  PCP: [place], MD  Brief History/Interval Summary: [name] is a 60 y.o. male with a past medical history of cerebral palsy, intellectual disability, history of microcytic anemia, anxiety, bipolar disorder, constipation, decubitus ulcers who recently underwent right below-knee amputation on 3/3 for chronic right foot ulcer with underlying osteomyelitis that failed nonoperative treatment.  Patient lives in group home and was brought in from there with complains of a cough ongoing for 2 days and development of nausea and vomiting on the morning of admission.  Patient is a poor historian.  A CT angiogram of the chest was done which did not show any PE but raised concern for pneumonia in the right lung.  Patient was hospitalized for further management.     Consultants: None  Procedures: None  Antibiotics: Anti-infectives (From admission, onward)   Start     Dose/Rate Route Frequency Ordered Stop   08/01/20 1700  azithromycin (ZITHROMAX) 500 mg in sodium chloride 0.9 % 250 mL IVPB        500 mg 250 mL/hr over 60 Minutes Intravenous Every 24 hours 07/31/20 1758 08/06/20 1659   08/01/20 1600  cefTRIAXone (ROCEPHIN) 2 g in sodium chloride 0.9 % 100 mL IVPB        2 g 200 mL/hr over 30 Minutes Intravenous Every 24 hours 07/31/20 1758 08/06/20 1559   07/31/20 1530  cefTRIAXone (ROCEPHIN) 1 g in sodium chloride 0.9 % 100 mL IVPB        1 g 200 mL/hr over 30 Minutes Intravenous  Once 07/31/20 1527 07/31/20 1700   07/31/20 1530  azithromycin (ZITHROMAX) 500 mg in sodium chloride 0.9 % 250 mL IVPB        500 mg 250 mL/hr over 60 Minutes Intravenous  Once 07/31/20 1527 07/31/20 1818      Subjective/Interval History: Patient is a poor historian.  Nursing staff reported that patient had one episode of emesis last night which was controlled with antiemetics.  Patient mentions that his  abdominal pain seems to be better controlled today.  Did not have any significant urinary retention last night.     Assessment/Plan:  Community-acquired versus aspiration pneumonia with right-sided pleural effusion Likely has parapneumonic pleural effusion.  Patient started on ceftriaxone and azithromycin.  Aspiration precautions.  Speech therapy to see.  No evidence for sepsis at this time.  COVID-19 and influenza testing was negative.  Does not have any oxygen requirements currently.  But due to his nausea vomiting he will not be able to tolerate antibiotics orally at this time since he is on IV antibiotics.  Nausea and vomiting/abdominal pain Could be due to pneumonia.  Yesterday there was tenderness over the suprapubic area but patient did not have any significant urinary retention.  Not as tender today.  He had one episode of vomiting earlier overnight but does not have any nausea this morning.  Discussed with nursing staff.  If patient continues to have nausea with vomiting we may have to do abdominal imaging studies.  His abdomen however is benign on examination.  His LFTs and lipase levels were normal.  UA did not suggest any infection.  History of cerebral palsy with anxiety disorder and bipolar disorder Noted to be on Ativan, Risperdal and Depakote which is being continued.  Patient is nonambulatory and gets around in a wheelchair.  Depakote level was 41.  Recent right below-knee amputation Right leg is  still in dressing.  The surgery was done by Dr. Victorino Dike on March 3.  According to family members dressing is supposed to stay in place till follow-up with Dr. Victorino Dike in 2 weeks.  Normocytic anemia Slight drop in hemoglobin is likely dilutional.  Patient has chronic anemia and is on iron supplements at home.  No evidence of overt bleeding.  Multiple decubitus ulcers Wound care nurse to evaluate.  Pressure Injury 08/10/18 Stage II -  Partial thickness loss of dermis presenting as a  shallow open ulcer with a red, pink wound bed without slough. (Active)  08/10/18 1300  Location: Ischial tuberosity  Location Orientation: Right  Staging: Stage II -  Partial thickness loss of dermis presenting as a shallow open ulcer with a red, pink wound bed without slough.  Wound Description (Comments):   Present on Admission: Yes     Pressure Injury 04/08/19 Coccyx Stage II -  Partial thickness loss of dermis presenting as a shallow open ulcer with a red, pink wound bed without slough. (Active)  04/08/19 2000  Location: Coccyx  Location Orientation:   Staging: Stage II -  Partial thickness loss of dermis presenting as a shallow open ulcer with a red, pink wound bed without slough.  Wound Description (Comments):   Present on Admission: Yes     Pressure Injury Arm Left;Posterior;Proximal;Upper;Other (Comment) Stage II -  Partial thickness loss of dermis presenting as a shallow open ulcer with a red, pink wound bed without slough. pink open wound on armpit (Active)     Location: Arm  Location Orientation: Left;Posterior;Proximal;Upper;Other (Comment)  Staging: Stage II -  Partial thickness loss of dermis presenting as a shallow open ulcer with a red, pink wound bed without slough.  Wound Description (Comments): pink open wound on armpit  Present on Admission: Yes     Pressure Injury 04/08/19 Arm Left Stage II -  Partial thickness loss of dermis presenting as a shallow open ulcer with a red, pink wound bed without slough. Pink open wound under armpit. (Active)  04/08/19 2000  Location: Arm  Location Orientation: Left  Staging: Stage II -  Partial thickness loss of dermis presenting as a shallow open ulcer with a red, pink wound bed without slough.  Wound Description (Comments): Pink open wound under armpit.  Present on Admission: Yes     Pressure Injury 04/08/19 Ankle Left;Lateral Unstageable - Full thickness tissue loss in which the base of the ulcer is covered by slough (yellow,  tan, gray, green or brown) and/or eschar (tan, brown or black) in the wound bed. black scab area with surrou (Active)  04/08/19 2000  Location: Ankle  Location Orientation: Left;Lateral  Staging: Unstageable - Full thickness tissue loss in which the base of the ulcer is covered by slough (yellow, tan, gray, green or brown) and/or eschar (tan, brown or black) in the wound bed.  Wound Description (Comments): black scab area with surrounding redness  Present on Admission: Yes     Pressure Injury 04/08/19 Foot Right;Medial;Anterior Unstageable - Full thickness tissue loss in which the base of the ulcer is covered by slough (yellow, tan, gray, green or brown) and/or eschar (tan, brown or black) in the wound bed. outer aspect of rig (Active)  04/08/19 2000  Location: Foot  Location Orientation: Right;Medial;Anterior  Staging: Unstageable - Full thickness tissue loss in which the base of the ulcer is covered by slough (yellow, tan, gray, green or brown) and/or eschar (tan, brown or black) in the wound bed.  Wound Description (Comments): outer aspect of right foot, scab area with surrounding redness  Present on Admission: Yes     Pressure Injury 07/31/20 Foot Anterior;Left (Active)  07/31/20 2152  Location: Foot  Location Orientation: Anterior;Left  Staging:   Wound Description (Comments):   Present on Admission: Yes      DVT Prophylaxis: Lovenox Code Status: Full code Family Communication: No family at bedside today.  Discussed with sister yesterday. Disposition Plan: Hopefully return to group home when improved  Status is: Inpatient  Remains inpatient appropriate because:Ongoing active pain requiring inpatient pain management and IV treatments appropriate due to intensity of illness or inability to take PO   Dispo: The patient is from: Group home              Anticipated d/c is to: Group home              Patient currently is not medically stable to d/c.   Difficult to place patient  No       Medications:  Scheduled: . aspirin EC  81 mg Oral BID  . baclofen  20 mg Oral TID  . divalproex  500 mg Oral BID  . enoxaparin (LOVENOX) injection  40 mg Subcutaneous Q24H  . pantoprazole  40 mg Oral Daily  . risperiDONE  2 mg Oral QHS  . tamsulosin  0.4 mg Oral Daily   Continuous: . sodium chloride 50 mL/hr at 08/01/20 0209  . azithromycin    . cefTRIAXone (ROCEPHIN)  IV     ZOX:[id number]:acetaminophen **OR** acetaminophen, LORazepam, ondansetron **OR** ondansetron (ZOFRAN) IV, oxyCODONE, polyethylene glycol   Objective:  Vital Signs  Vitals:   07/31/20 2015 07/31/20 2153 08/01/20 0256 08/01/20 0652  BP: (!) 103/56 120/73 126/83 119/71  Pulse: 89 78 77 65  Resp: 18 16 16 16   Temp:  98.9 F (37.2 C) 98 F (36.7 C) 98.1 F (36.7 C)  TempSrc:  Oral Oral Oral  SpO2: 95% 98% 94% 100%    Intake/Output Summary (Last 24 hours) at 08/01/2020 1035 Last data filed at 08/01/2020 0650 Gross per 24 hour  Intake 484.27 ml  Output 150 ml  Net 334.27 ml   There were no vitals filed for this visit.  General appearance: Awake alert.  In no distress.  Noted to be distracted. Resp: Normal effort at rest.  Diminished air entry at the right base.  Few crackles. Cardio: S1-S2 is normal regular.  No S3-S4.  No rubs murmurs or bruit GI: Abdomen is soft.  Slightly tender in the lower abdomen without any rebound rigidity or guarding.  Bowel sounds are present and normal.  No masses organomegaly.  Extremities: Right leg covered in dressing.  Left lower leg covered in dressing.  Some edema is noted. Neurologic: Has cerebral palsy with limited mobility at baseline.  No new focal deficits.   Lab Results:  Data Reviewed: I have personally reviewed following labs and imaging studies  CBC: Recent Labs  Lab 07/27/20 0913 07/27/20 1053 07/31/20 1204 08/01/20 0321  WBC 8.2  --  12.9* 10.9*  NEUTROABS  --   --  7.9*  --   HGB 12.8* 13.9 12.1* 11.4*  HCT 39.6 41.0 38.3* 35.7*  MCV 79.8*   --  80.3 80.4  PLT 547*  --  507* 455*    Basic Metabolic Panel: Recent Labs  Lab 07/27/20 0913 07/27/20 1053 07/31/20 1204 08/01/20 0321  NA 138 141 136 137  K 4.1 4.5 3.6 4.0  CL 104 107  103 105  CO2 23  --  25 24  GLUCOSE 79 85 82 120*  BUN 9 10 16 15   CREATININE 0.53* 0.50* 0.66 0.51*  CALCIUM 8.9  --  9.0 9.0    GFR: Estimated Creatinine Clearance: 88.9 mL/min (A) (by C-G formula based on SCr of 0.51 mg/dL (L)).  Liver Function Tests: Recent Labs  Lab 07/31/20 1204 08/01/20 0321  AST 20 19  ALT 13 12  ALKPHOS 101 103  BILITOT 0.5 0.6  PROT 7.1 6.7  ALBUMIN 2.9* 2.6*    Recent Labs  Lab 07/31/20 1204  LIPASE 25     Recent Results (from the past 240 hour(s))  SARS Coronavirus 2 by RT PCR (hospital order, performed in [facility] hospital lab) Nasopharyngeal Nasopharyngeal Swab     Status: None   Collection Time: 07/27/20  8:24 AM   Specimen: Nasopharyngeal Swab  Result Value Ref Range Status   SARS Coronavirus 2 NEGATIVE NEGATIVE Final    Comment: (NOTE) SARS-CoV-2 target nucleic acids are NOT DETECTED.  The SARS-CoV-2 RNA is generally detectable in upper and lower respiratory specimens during the acute phase of infection. The lowest concentration of SARS-CoV-2 viral copies this assay can detect is 250 copies / mL. A negative result does not preclude SARS-CoV-2 infection and should not be used as the sole basis for treatment or other patient management decisions.  A negative result may occur with improper specimen collection / handling, submission of specimen other than nasopharyngeal swab, presence of viral mutation(s) within the areas targeted by this assay, and inadequate number of viral copies (<250 copies / mL). A negative result must be combined with clinical observations, patient history, and epidemiological information.  Fact Sheet for Patients:   09/26/20  Fact Sheet for Healthcare  Providers: BoilerBrush.com.cy  This test is not yet approved or  cleared by the https://pope.com/ FDA and has been authorized for detection and/or diagnosis of SARS-CoV-2 by FDA under an Emergency Use Authorization (EUA).  This EUA will remain in effect (meaning this test can be used) for the duration of the COVID-19 declaration under Section 564(b)(1) of the Act, 21 U.S.C. section 360bbb-3(b)(1), unless the authorization is terminated or revoked sooner.  Performed at [facility], [address], [place], [place]   Surgical pcr screen     Status: None   Collection Time: 07/27/20  9:23 AM   Specimen: Nasal Mucosa; Nasal Swab  Result Value Ref Range Status   MRSA, PCR NEGATIVE NEGATIVE Final   Staphylococcus aureus NEGATIVE NEGATIVE Final    Comment: (NOTE) The Xpert SA Assay (FDA approved for NASAL specimens in patients 48 years of age and older), is one component of a comprehensive surveillance program. It is not intended to diagnose infection nor to guide or monitor treatment. Performed at [facility], [address], [place], [place]   Resp Panel by RT-PCR (Flu A&B, Covid) Nasopharyngeal Swab     Status: None   Collection Time: 07/31/20 12:04 PM   Specimen: Nasopharyngeal Swab; Nasopharyngeal(NP) swabs in vial transport medium  Result Value Ref Range Status   SARS Coronavirus 2 by RT PCR NEGATIVE NEGATIVE Final    Comment: (NOTE) SARS-CoV-2 target nucleic acids are NOT DETECTED.  The SARS-CoV-2 RNA is generally detectable in upper respiratory specimens during the acute phase of infection. The lowest concentration of SARS-CoV-2 viral copies this assay can detect is 138 copies/mL. A negative result does not preclude SARS-Cov-2 infection and should not  be used as the sole basis for treatment or other patient management decisions. A negative result may occur with  improper specimen collection/handling, submission of specimen  other than nasopharyngeal swab, presence of viral mutation(s) within the areas targeted by this assay, and inadequate number of viral copies(<138 copies/mL). A negative result must be combined with clinical observations, patient history, and epidemiological information. The expected result is Negative.  Fact Sheet for Patients:  BloggerCourse.com  Fact Sheet for Healthcare Providers:  SeriousBroker.it  This test is no t yet approved or cleared by the [place] FDA and  has been authorized for detection and/or diagnosis of SARS-CoV-2 by FDA under an Emergency Use Authorization (EUA). This EUA will remain  in effect (meaning this test can be used) for the duration of the COVID-19 declaration under Section 564(b)(1) of the Act, 21 U.S.C.section 360bbb-3(b)(1), unless the authorization is terminated  or revoked sooner.       Influenza A by PCR NEGATIVE NEGATIVE Final   Influenza B by PCR NEGATIVE NEGATIVE Final    Comment: (NOTE) The Xpert Xpress SARS-CoV-2/FLU/RSV plus assay is intended as an aid in the diagnosis of influenza from Nasopharyngeal swab specimens and should not be used as a sole basis for treatment. Nasal washings and aspirates are unacceptable for Xpert Xpress SARS-CoV-2/FLU/RSV testing.  Fact Sheet for Patients: BloggerCourse.com  Fact Sheet for Healthcare Providers: SeriousBroker.it  This test is not yet approved or cleared by the [place] FDA and has been authorized for detection and/or diagnosis of SARS-CoV-2 by FDA under an Emergency Use Authorization (EUA). This EUA will remain in effect (meaning this test can be used) for the duration of the COVID-19 declaration under Section 564(b)(1) of the Act, 21 U.S.C. section 360bbb-3(b)(1), unless the authorization is terminated or revoked.  Performed at [facility], [address] [place], [place] [address]       Radiology Studies: CT Angio Chest PE W and/or Wo Contrast  Result Date: 07/31/2020 CLINICAL DATA:  Shortness of breath, cough, pneumonia versus chest pain EXAM: CT ANGIOGRAPHY CHEST WITH CONTRAST TECHNIQUE: Multidetector CT imaging of the chest was performed using the standard protocol during bolus administration of intravenous contrast. Multiplanar CT image reconstructions and MIPs were obtained to evaluate the vascular anatomy. CONTRAST:  17mL OMNIPAQUE IOHEXOL 350 MG/ML SOLN COMPARISON:  CT chest dated 03/13/2014 FINDINGS: Cardiovascular: Satisfactory opacification the pulmonary arteries to the lobar level. No evidence of pulmonary embolism. Although not tailored for evaluation of the thoracic aorta, there is no evidence of thoracic aortic aneurysm or dissection. The heart is normal in size.  No pericardial effusion. Mild coronary atherosclerosis of the LAD. Mediastinum/Nodes: No suspicious mediastinal lymphadenopathy. Visualized thyroid is unremarkable. Lungs/Pleura: Evaluation of the lung parenchyma is constrained by respiratory motion. Moderate right and trace left pleural effusions. Mild patchy opacity/atelectasis in the right upper, middle, and lower lobes. No frank interstitial edema. No suspicious pulmonary nodules. No pneumothorax. Upper Abdomen: Visualized upper abdomen is notable for a large hiatal hernia with postsurgical changes at the GE junction. Musculoskeletal: Degenerative changes of the visualized thoracolumbar spine. Review of the MIP images confirms the above findings. IMPRESSION: No evidence of pulmonary embolism. Moderate right and trace left pleural effusions. No frank interstitial edema. Mild patchy opacity/atelectasis in the right lung, as above. Large hiatal hernia with postsurgical changes at the GE junction. Electronically Signed   By: Charline Bills M.D.   On: 07/31/2020 15:01   DG Chest Portable 1 View  Result Date: 07/31/2020 CLINICAL  DATA:  Chest pain and cough. EXAM: PORTABLE CHEST 1 VIEW COMPARISON:  April 08, 2019 FINDINGS: The study is limited secondary to difficult patient positioning. Mild to moderate severity airspace disease is suspected within the right lung base with a small right pleural effusion. The left lung is clear. No pneumothorax is identified. The heart size and mediastinal contours are within normal limits. There is a large gastric hernia with radiopaque surgical clips seen just below the expected region of the esophageal hiatus. Degenerative changes seen within the thoracic spine. IMPRESSION: 1. Limited study with suspected right basilar airspace disease and a small right pleural effusion. 2. Large gastric hernia. Electronically Signed   By: Aram Candela M.D.   On: 07/31/2020 11:31       LOS: 1 day   Gokul Krishnan  [facility] Pager on [email or web address]  08/01/2020, 10:35 AM

## 2020-08-01 NOTE — Consult Note (Addendum)
WOC Nurse Consult Note: Reason for Consult: Consult requested for left leg and sacrum. Pt has multiple contracted extremities and is on a low airloss mattress for pressure reduction.  Left inner buttock/sacrum with a stage 1 pressure injury; 1X1cm red and nonblanching Left inner knee a stage 1 pressure injury; 1X1cm red and nonblanching Left inner ankle with chronic stage 3 pressure injury; 100% red, mod amt green tinged drainage, no odor, 1X2X.2cm Pressure Injury POA: Yes Dressing procedure/placement/frequency: Pt recently had a right BKA surgery on 3/3 performed by Dr Victorino Dike of the ortho service and was told to leave the dressing in place for 2 weeks, according to progress notes. Please refer to Dr Victorino Dike for further questions regarding this location. Topical treatment orders provided for bedside nurses to perform as follows to provide antimicrobial benefits: Cut piece of Aquacel [place] # [phone]) and apply to left inner ankle Q day, then cover with foam dressing.  (Change foam dressing Q 3 days or PRN soiling.) Foam dressing to left knee and sacrum/buttocks; change Q 3 days or PRN soiling. Please re-consult if further assistance is needed.  Thank-you,  Cammie Mcgee MSN, RN, CWOCN, [place], CNS [phone]

## 2020-08-01 NOTE — Evaluation (Addendum)
Clinical/Bedside Swallow Evaluation Patient Details  Name: [name] MRN: [id number] Date of Birth: 04-01-1961  Today's Date: 08/01/2020 Time: SLP Start Time (ACUTE ONLY): 0910 SLP Stop Time (ACUTE ONLY): 0959 SLP Time Calculation (min) (ACUTE ONLY): 49 min  Past Medical History:  Past Medical History:  Diagnosis Date  . Anemia    microcytic anemia   . Anxiety    takes Ativan daily as needed  . Bipolar disorder (HCC)    takes Depakote daily  . CEREBRAL PALSY 07/24/2006  . Chronic bronchitis (HCC)    "gets it q yr"  . Chronic bronchitis (HCC)   . 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)   . Constipation    takes Miralax daily as needed  . Depression    takes Risperdal nightly  . Environmental allergies    takes Zyrtec daily  . GASTRIC ULCER ACUTE WITH HEMORRHAGE 07/24/2006  . Gastric ulcer with hemorrhage    hx of   . GERD (gastroesophageal reflux disease)    takes Pravacid daily  . History of blood transfusion    no abnormal reaction noted  . History of hiatal hernia   . History of shingles   . History of stomach ulcers ~ 1996  . Hypercholesterolemia   . Intellectual functioning disability   . Intermittent explosive disorder   . Internal hemorrhoids   . MRSA infection 09/06/2019  . Obsessive compulsive disorder   . Osteomyelitis (HCC)    right foot   . Pneumonia "quite a few times"   in 2015 and in June 2016  . Pressure ulcer of sacral region    hx of   . Pseudomonas aeruginosa infection 09/06/2019  . Psychotic disorder (HCC)   . Recurrent UTI (urinary tract infection)   . Seborrheic dermatitis   . Sigmoid diverticulosis   . Spastic tetraplegia (HCC)   . URINARY INCONTINENCE 02/09/2010   takes Detrol daily-occasionally incontinence  . Vitamin D deficiency   . Weakness    numbness in extremities   Past Surgical History:  Past Surgical History:  Procedure Laterality Date  . AMPUTATION Right 12/14/2014   Procedure: AMPUTATION DIGIT  RIGHT GREAT TOE MTP;  Surgeon: Nadara Mustard, MD;  Location: [facility];  Service: Orthopedics;  Laterality: Right;  . AMPUTATION Right 10/08/2019   Procedure: RIGHT FIFTH RAY AMPUTATION, ADJACENT TISSUE TRANSFER;  Surgeon: Park Liter, DPM;  Location: [facility];  Service: Podiatry;  Laterality: Right;  . AMPUTATION Right 07/27/2020   Procedure: AMPUTATION BELOW KNEE;  Surgeon: Toni Arthurs, MD;  Location: [facility];  Service: Orthopedics;  Laterality: Right;   . BIOPSY  08/12/2018   Procedure: BIOPSY;  Surgeon: Lynann Bologna, MD;  Location: [facility];  Service: Gastroenterology;;  . COLONOSCOPY WITH PROPOFOL N/A 08/14/2018   Procedure: COLONOSCOPY WITH PROPOFOL;  Surgeon: Lynann Bologna, MD;  Location: [facility];  Service: Gastroenterology;  Laterality: N/A;  . ESOPHAGOGASTRODUODENOSCOPY (EGD) WITH PROPOFOL N/A 08/12/2018   Procedure: ESOPHAGOGASTRODUODENOSCOPY (EGD) WITH PROPOFOL;  Surgeon: Lynann Bologna, MD;  Location: [facility];  Service: Gastroenterology;  Laterality: N/A;  . GRAFT APPLICATION Right 11/12/2019   Procedure: SKIN GRAFT SUBSTITUTE APPLICATION;  Surgeon: Park Liter, DPM;  Location: [facility];  Service: Podiatry;  Laterality: Right;  . HERNIA REPAIR    . HIATAL HERNIA REPAIR  1982  . LEG SURGERY Bilateral ~ 1967   "legs were scissored; stretched them out"  . WOUND DEBRIDEMENT Right 11/12/2019   Procedure: DEBRIDEMENT WOUND;  Surgeon: Ventura Sellers  J, DPM;  Location: [facility];  Service: Podiatry;  Laterality: Right;   HPI:  60 yo male with h/o CP admit to [facility] with n/v for 2 days PTA.  Pt has significant wounds and has undergone multiple procedures.  Current CT chest showed moderate pleural effusions, mild opacity/ATX at right lung, large hiatal hernia with post-surgical changes.  His CT of chest 03/14/2014 showed right base consolidation, appearance of ATX/infiltrate, large HH, scolios.   Pt resides a group home.  He has had prior Nissen that per notes has "slipped".  Prior  endoscopy 07/2018 showed Moderate-large hiatal hernia with slipped Nissen's fundoplication, Patent Billroth II gastrojejunostomy was found, characterized by healthy appearing mucosa, Moderate gastritis of the gastric remnant.--  H/o anemia.     Swallow eval ordered.  Per endoscopy 07/2018, pt with tortuous lower 1/3 of esophagus, gastritis, hiatal hernia, etc.   Assessment / Plan / Recommendation Clinical Impression  Pt alert, pleasant and has negative CN exam, he was elevated (as much as able) side lying for his breakfast.   Postioning is significant factor for comfort/safer po.  Retention of mild amount of thin secretions noted orally prior to offering any po.presents with minimal oral deficits impacting management of solids.   Pt followed directions to swallow secretions with adequate clearance.  No significant indications of oropharyngeal dysphagia - rather primary clinical symptoms appear consistent with known esophageal dysphagia.   SLP assisted pt with breakfast including eggs, grits, bite of [place] sandwich, water and cranberry juice.  Initially pt without any indication of difficulties = however after approx 15% of meal consumed, pt began belching, coughing and expectorating secretions.  He admits this occuring prior to admit at times but not frequently.   Recommend small frequent meals of dys3/thin - avoiding significantly particulate matter due to risk of aspiration from particulates spilling from oral cavity into airway due to suboptimal positioning.    SLP will follow up to assure optimal tolerance of diet and to assess if instrumental eval indicated.  If signficant exacerbation of esophageal dysphagia is a concern, pt may benefit from considering GI referral.   He has not had hospital admits associated with asp pnas and reports his weight to be stable, thus suspect tolerance of chronic dysphagia.     SLP Visit Diagnosis: Dysphagia, unspecified (R13.10)    Aspiration Risk  Moderate  aspiration risk    Diet Recommendation Other (Comment)   Liquid Administration via: Cup;Straw Medication Administration: Whole meds with puree (whole if small - sweetened applesauce) Supervision: Staff to assist with self feeding Compensations: Slow rate;Small sips/bites;Other (Comment) Postural Changes: Seated upright at 90 degrees;Remain upright for at least 30 minutes after po intake    Other  Recommendations Oral Care Recommendations: Oral care BID   Follow up Recommendations        Frequency and Duration min 1 x/week  1 week       Prognosis Prognosis for Safe Diet Advancement: Fair      Swallow Study   General Date of Onset: 08/01/20 HPI: 60 yo male with h/o CP admit to [facility] with n/v for 2 days PTA.  Pt has significant wounds and has undergone multiple procedures.  Current CT chest showed moderate pleural effusions, mild opacity/ATX at right lung, large hiatal hernia with post-surgical changes.  His CT of chest 03/14/2014 showed right base consolidation, appearance of ATX/infiltrate, large HH, scolios.   Pt resides a group home.  He has had prior Nissen that per notes has "slipped".  Prior  endoscopy 07/2018 showed Moderate-large hiatal hernia with slipped Nissen's fundoplication, Patent Billroth II gastrojejunostomy was found, characterized by healthy appearing mucosa, Moderate gastritis of the gastric remnant.--  H/o anemia.     Swallow eval ordered.  Per endoscopy 07/2018, pt with tortuous lower 1/3 of esophagus, gastritis, hiatal hernia, etc. Type of Study: Bedside Swallow Evaluation Previous Swallow Assessment: see HPI Diet Prior to this Study: Dysphagia 2 (chopped);Thin liquids Temperature Spikes Noted: No Respiratory Status: Room air History of Recent Intubation: No Behavior/Cognition: Alert;Cooperative Oral Cavity Assessment: Excessive secretions Oral Care Completed by SLP: Yes Oral Cavity - Dentition: Adequate natural dentition Vision: Impaired for  self-feeding Self-Feeding Abilities: Needs assist Patient Positioning: Other (comment) (upright side lying) Volitional Cough: Strong (productive at times) Volitional Swallow: Able to elicit    Oral/Motor/Sensory Function Overall Oral Motor/Sensory Function: Within functional limits   Ice Chips Ice chips: Not tested   Thin Liquid Thin Liquid: Impaired Oral Phase Impairments: Other (comment) (mild lingual thrusting) Pharyngeal  Phase Impairments: Cough - Delayed    Nectar Thick Nectar Thick Liquid: Not tested   Honey Thick Honey Thick Liquid: Not tested   Puree Puree: Impaired Presentation: Spoon Oral Phase Functional Implications: Other (comment);Oral residue (mild lingual thrusting)   Solid     Solid: Impaired Oral Phase Impairments: Reduced lingual movement/coordination;Other (comment) (lingual thrusting mild) Oral Phase Functional Implications: Left lateral sulci pocketing (minimal left lateral sulci pocketing - of eggs)      Chales Abrahams 08/01/2020,11:09 AM  [name], MS [facility] SLP Acute Rehab Services Office [phone] Pager [phone]

## 2020-08-01 NOTE — Progress Notes (Signed)
  Speech Language Pathology Treatment: Dysphagia  Patient Details Name: [name] MRN: [id number] DOB: 19-May-1961 Today's Date: 08/01/2020 Time: 7846-9629 SLP Time Calculation (min) (ACUTE ONLY): 16 min  Assessment / Plan / Recommendation Clinical Impression  Second session completed to assist pt with use of beneficial compensation strategies including strict aspiration precautions and use of oral suctioning.  Oral suction was set up and demonstrated to pt - and left within pt's reach to his mouth.  he is noted to have a sensitive gag - and thus he should only be suctioned at anterior oral cavity - just behind teeth. Pt benefited from verbal cues to bring secretions to anterior portion of oral cavity for adequate clearance.  SLP propped oral suction within pt's reach of his oral cavity - to allow maximum independence.  Given pt's tortuous distal esophagus, suspect significant dysmotility possible - and thus recommend to maximize liquid nutrition (ie Ensure, Glucerna, etc) if easier for him to tolerate. Pt in agreement to plan. SLP left oral suction within pt's reach of his mouth and soft call bell within reach.  Instructed pt and NT to assure only suctioning at anterior portion of pt's oral cavity due to his sensitive gag - and incr risk of vomiting and aspirating emesis.  Educated pt to recommendations and findings using teach back and written precautions.    HPI HPI: 60 yo male with h/o CP admit to [facility] with n/v for 2 days PTA.  Pt has significant wounds and has undergone multiple procedures.  Current CT chest showed moderate pleural effusions, mild opacity/ATX at right lung, large hiatal hernia with post-surgical changes.  His CT of chest 03/14/2014 showed right base consolidation, appearance of ATX/infiltrate, large HH, scolios.   Pt resides a group home.  He has had prior Nissen that per notes has "slipped".  Prior endoscopy 07/2018 showed Moderate-large hiatal hernia with slipped Nissen's  fundoplication, Patent Billroth II gastrojejunostomy was found, characterized by healthy appearing mucosa, Moderate gastritis of the gastric remnant.--  H/o anemia.     Swallow eval ordered.  Per endoscopy 07/2018, pt with tortuous lower 1/3 of esophagus, gastritis, hiatal hernia, etc.      SLP Plan  Continue with current plan of care       Recommendations  Diet recommendations: Dysphagia 3 (mechanical soft);Thin liquid Medication Administration: Whole meds with puree (whole if small - sweetened applesauce) Compensations: Slow rate;Small sips/bites;Other (Comment) (SMALL FREQUENT MEALS) Postural Changes and/or Swallow Maneuvers: Upright 30-60 min after meal;Seated upright 90 degrees                Oral Care Recommendations: Oral care BID SLP Visit Diagnosis: Dysphagia, unspecified (R13.10) Plan: Continue with current plan of care       GO                Chales Abrahams 08/01/2020, 8:50 PM   [name], MS [facility] SLP Acute Rehab Services Office [phone] Pager [phone]

## 2020-08-02 ENCOUNTER — Inpatient Hospital Stay (HOSPITAL_COMMUNITY): Payer: Medicare Other

## 2020-08-02 DIAGNOSIS — J189 Pneumonia, unspecified organism: Secondary | ICD-10-CM

## 2020-08-02 DIAGNOSIS — Z89511 Acquired absence of right leg below knee: Secondary | ICD-10-CM

## 2020-08-02 DIAGNOSIS — G808 Other cerebral palsy: Secondary | ICD-10-CM

## 2020-08-02 DIAGNOSIS — R112 Nausea with vomiting, unspecified: Secondary | ICD-10-CM

## 2020-08-02 LAB — URINE CULTURE: Culture: NO GROWTH

## 2020-08-02 LAB — BASIC METABOLIC PANEL
Anion gap: 10 (ref 5–15)
BUN: 12 mg/dL (ref 6–20)
CO2: 22 mmol/L (ref 22–32)
Calcium: 8.6 mg/dL — ABNORMAL LOW (ref 8.9–10.3)
Chloride: 103 mmol/L (ref 98–111)
Creatinine, Ser: 0.44 mg/dL — ABNORMAL LOW (ref 0.61–1.24)
GFR, Estimated: >60 mL/min (ref >60)
Glucose, Bld: 115 mg/dL — ABNORMAL HIGH (ref 70–99)
Potassium: 3.5 mmol/L (ref 3.5–5.1)
Sodium: 135 mmol/L (ref 135–145)

## 2020-08-02 LAB — CBC
HCT: 34.2 % — ABNORMAL LOW (ref 39.0–52.0)
Hemoglobin: 11 g/dL — ABNORMAL LOW (ref 13.0–17.0)
MCH: 25.8 pg — ABNORMAL LOW (ref 26.0–34.0)
MCHC: 32.2 g/dL (ref 30.0–36.0)
MCV: 80.3 fL (ref 80.0–100.0)
Platelets: 432 10*3/uL — ABNORMAL HIGH (ref 150–400)
RBC: 4.26 MIL/uL (ref 4.22–5.81)
RDW: 18.9 % — ABNORMAL HIGH (ref 11.5–15.5)
WBC: 9 10*3/uL (ref 4.0–10.5)
nRBC: 0 % (ref 0.0–0.2)

## 2020-08-02 MED ORDER — POLYETHYLENE GLYCOL 3350 17 G PO PACK
17.0000 g | PACK | Freq: Two times a day (BID) | ORAL | Status: DC
  Administered 2020-08-03: 17 g via ORAL
  Filled 2020-08-02 (×2): qty 1

## 2020-08-02 MED ORDER — AZITHROMYCIN 250 MG PO TABS
500.0000 mg | ORAL_TABLET | Freq: Every day | ORAL | Status: DC
  Administered 2020-08-02 – 2020-08-03 (×2): 500 mg via ORAL
  Filled 2020-08-02 (×2): qty 2

## 2020-08-02 MED ORDER — SENNOSIDES-DOCUSATE SODIUM 8.6-50 MG PO TABS
1.0000 | ORAL_TABLET | Freq: Two times a day (BID) | ORAL | Status: DC
  Administered 2020-08-03 (×2): 1 via ORAL
  Filled 2020-08-02 (×3): qty 1

## 2020-08-02 MED ORDER — BISACODYL 10 MG RE SUPP: 10.0000 mg | Freq: Every day | RECTAL | Status: DC | PRN

## 2020-08-02 NOTE — Progress Notes (Signed)
PT Cancellation Note  Patient Details Name: [name] MRN: [id number] DOB: 24-Feb-1961   Cancelled Treatment:    Reason Eval/Treat Not Completed: PT screened, no needs identified, will sign off (Per chart review and discussion with RN, pt requires Total Assist at baseline for all bed mobility.)  Mr. Verdi requires mechanical lift for OOB transfers PTA and has [facility] at group home. 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 Discussed with nursing staff to utilize mechanical lift for OOB activity. Patient has no acute PT needs. PT will sign off at this time.    Wynn Maudlin, DPT Acute Rehabilitation Services Office [phone] Pager [phone]    Anitra Lauth 08/02/2020, 1:18 PM

## 2020-08-02 NOTE — Progress Notes (Signed)
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  Patient currently lives in a group home and was brought in from there with complaints of a cough is ongoing for last few days and developing nausea and vomiting on the morning of admission.  He has been a poor historian and CT of the chest was done and did not show any PE but raised concern for pneumonia in the right lung and patient was hospitalized for further management.  Currently he is on a dysphagia 3 diet and currently having his bowel regimen adjusted and currently being treated for his pneumonia.  Assessment & Plan:   Active Problems:   CAP (community acquired pneumonia)   Cerebral palsy, quadriplegic (HCC)   Nausea and vomiting  Community-acquired versus aspiration pneumonia with right-sided pleural effusion Likely has parapneumonic pleural effusion.  Patient started on ceftriaxone and azithromycin.  Aspiration precautions.  Speech therapy to see.  No evidence for sepsis at this time.   -COVID-19 and influenza testing was negative.   -Does not have any oxygen requirements currently.   -WBC has improved from 12.9 and trended down to 9.0 -We will need a repeat chest x-ray in a.m. -But because of his initial nausea vomiting he will not be able to tolerate antibiotics orally at this time since he is on IV antibiotics and has not changed to p.o. azithromycin given now that his diet tolerance is better.  Nausea and vomiting/abdominal  pain -Could be due to pneumonia and is improving.   -The day before yesterday there was tenderness over the suprapubic area but patient did not have any significant urinary retention.  Not as tender today.   -He had one episode of vomiting earlier overnight from 3/7-3/8 but does not have any nausea this morning.   -If patient continues to have nausea with vomiting we may have to do abdominal imaging studies.  His abdomen however is benign on examination.  His LFTs and lipase levels were normal.  UA did not suggest any infection. -Continue with antiemetics with Zofran milligrams p.o./IV every 6 as needed for nausea -This could be in the setting of constipation  History of cerebral palsy with anxiety disorder and bipolar disorder -Noted to be on risperidone 2 mg p.o. nightly, lorazepam 1 mg p.o. every 4 hours as needed anxiety, as well as divalproex 500 large p.o. twice daily and baclofen 20 mg p.o. 3 times daily, -Patient is nonambulatory and gets around in a wheelchair.  Depakote level was 41.  Recent right below-knee amputation -Right leg is still in dressing.   -The surgery was done by Dr. Victorino DikeHewitt on March 3.  According to family members dressing is supposed to stay in place till follow-up with Dr. Victorino DikeHewitt in 2 weeks. -Current pain control with oxycodone IR 5 mg p.o. every 6 as needed for severe pain -PT and OT have signed off as patient is total care assist for ADLs and mobility at baseline  Normocytic Anemia Slight drop in hemoglobin is likely dilutional as patient's hemoglobin/hematocrit went from 12.1/38.3 -> 11.4/35.7 -> 11.0/34.2.   -Patient  has chronic anemia and is on iron supplements at home.   -Continue to monitor for signs and symptoms of bleeding; currently no evidence of overt bleeding but he has had some bloody bowel movements at times. -May consider FOBT if hemoglobin continues to drop further  Constipation -Per discussion with the staff he is having difficulty having  successful bowel movements and are at times bloody and hard -We will initiate stool softener with MiraLAX twice daily scheduled, senna docusate 1 tab p.o. twice daily, and add bisacodyl 10 mg rectal suppository -May need an enema if necessary  Thrombocytosis -Likely reactive and patient's platelet count is trending up from 507 and is now 432 -Continue to monitor for signs or symptoms of bleeding -Repeat CBC in a.m.  Multiple decubitus ulcers Wound care nurse to evaluate and please see her note for comfortable treatment recommendations  Pressure Injury 08/10/18 Stage II -  Partial thickness loss of dermis presenting as a shallow open ulcer with a red, pink wound bed without slough. (Active)  08/10/18 1300  Location: Ischial tuberosity  Location Orientation: Right  Staging: Stage II -  Partial thickness loss of dermis presenting as a shallow open ulcer with a red, pink wound bed without slough.  Wound Description (Comments):   Present on Admission: Yes     Pressure Injury 04/08/19 Coccyx Stage II -  Partial thickness loss of dermis presenting as a shallow open ulcer with a red, pink wound bed without slough. (Active)  04/08/19 2000  Location: Coccyx  Location Orientation:   Staging: Stage II -  Partial thickness loss of dermis presenting as a shallow open ulcer with a red, pink wound bed without slough.  Wound Description (Comments):   Present on Admission: Yes     Pressure Injury Arm Left;Posterior;Proximal;Upper;Other (Comment) Stage II -  Partial thickness loss of dermis presenting as a shallow open ulcer with a red, pink wound bed without slough. pink open wound on armpit (Active)     Location: Arm  Location Orientation: Left;Posterior;Proximal;Upper;Other (Comment)  Staging: Stage II -  Partial thickness loss of dermis presenting as a shallow open ulcer with a red, pink wound bed without slough.  Wound Description (Comments): pink open wound on armpit  Present on Admission: Yes      Pressure Injury 04/08/19 Arm Left Stage II -  Partial thickness loss of dermis presenting as a shallow open ulcer with a red, pink wound bed without slough. Pink open wound under armpit. (Active)  04/08/19 2000  Location: Arm  Location Orientation: Left  Staging: Stage II -  Partial thickness loss of dermis presenting as a shallow open ulcer with a red, pink wound bed without slough.  Wound Description (Comments): Pink open wound under armpit.  Present on Admission: Yes     Pressure Injury 04/08/19 Ankle Left;Lateral Unstageable - Full thickness tissue loss in which the base of the ulcer is covered by slough (yellow, tan, gray, green or brown) and/or eschar (tan, brown or black) in the wound bed. black scab area with surrou (Active)  04/08/19 2000  Location: Ankle  Location Orientation: Left;Lateral  Staging: Unstageable - Full thickness tissue loss in which the base of the ulcer is covered by slough (yellow, tan, gray, green or brown) and/or eschar (tan, brown or black) in the wound bed.  Wound Description (Comments): black scab area with surrounding redness  Present on Admission: Yes     Pressure Injury 04/08/19 Foot Right;Medial;Anterior Unstageable - Full thickness tissue loss in which the  base of the ulcer is covered by slough (yellow, tan, gray, green or brown) and/or eschar (tan, brown or black) in the wound bed. outer aspect of rig (Active)  04/08/19 2000  Location: Foot  Location Orientation: Right;Medial;Anterior  Staging: Unstageable - Full thickness tissue loss in which the base of the ulcer is covered by slough (yellow, tan, gray, green or brown) and/or eschar (tan, brown or black) in the wound bed.  Wound Description (Comments): outer aspect of right foot, scab area with surrounding redness  Present on Admission: Yes     Pressure Injury 07/31/20 Foot Anterior;Left (Active)  07/31/20 2152  Location: Foot  Location Orientation: Anterior;Left  Staging:   Wound  Description (Comments):   Present on Admission: Yes   DVT prophylaxis: Enoxaparin 40 mg sq q24h Code Status: FULL CODE Family Communication: No family present at bedside  Disposition Plan: Back to group home with total care  Status is: Inpatient  Remains inpatient appropriate because:Unsafe d/c plan, IV treatments appropriate due to intensity of illness or inability to take PO and Inpatient level of care appropriate due to severity of illness   Dispo: The patient is from: Group home              Anticipated d/c is to: Group home              Patient currently is not medically stable to d/c.   Difficult to place patient No  Consultants:   None   Procedures: None  Antimicrobials:  Anti-infectives (From admission, onward)   Start     Dose/Rate Route Frequency Ordered Stop   08/02/20 1700  azithromycin (ZITHROMAX) tablet 500 mg        500 mg Oral Daily with supper 08/02/20 0842 08/05/20 1659   08/01/20 1700  azithromycin (ZITHROMAX) 500 mg in sodium chloride 0.9 % 250 mL IVPB  Status:  Discontinued        500 mg 250 mL/hr over 60 Minutes Intravenous Every 24 hours 07/31/20 1758 08/02/20 0842   08/01/20 1600  cefTRIAXone (ROCEPHIN) 2 g in sodium chloride 0.9 % 100 mL IVPB        2 g 200 mL/hr over 30 Minutes Intravenous Every 24 hours 07/31/20 1758 08/06/20 1559   07/31/20 1530  cefTRIAXone (ROCEPHIN) 1 g in sodium chloride 0.9 % 100 mL IVPB        1 g 200 mL/hr over 30 Minutes Intravenous  Once 07/31/20 1527 07/31/20 1700   07/31/20 1530  azithromycin (ZITHROMAX) 500 mg in sodium chloride 0.9 % 250 mL IVPB        500 mg 250 mL/hr over 60 Minutes Intravenous  Once 07/31/20 1527 07/31/20 1818        Subjective: Seen and examined at bedside.  He was complaining about trying to have a bowel movement.  No nausea or vomiting today.  Denies any lightheadedness or dizziness.  Felt okay.  No other concerns or close at this time.  Objective: Vitals:   08/01/20 1905 08/01/20 2143  08/02/20 0548 08/02/20 1256  BP:  (!) 112/51 120/71 124/62  Pulse:  68 62 80  Resp:  16 16 19   Temp:  98.3 F (36.8 C) 98.5 F (36.9 C) 98.6 F (37 C)  TempSrc:  Oral Oral Oral  SpO2:  98% 100% 100%  Weight: 98.4 kg     Height: 6\' 2"  (1.88 m)       Intake/Output Summary (Last 24 hours) at 08/02/2020 1620 Last data filed at  08/02/2020 1300 Gross per 24 hour  Intake 2324.92 ml  Output 1900 ml  Net 424.92 ml   Filed Weights   08/01/20 1905  Weight: 98.4 kg    Examination: Physical Exam:  Constitutional: WN/WD overweight chronically ill-appearing male with cerebral palsy who appears slightly uncomfortable Eyes: Lids and conjunctivae normal, sclerae anicteric  ENMT: External Ears, Nose appear normal. Grossly normal hearing. Neck: Appears normal, supple, no cervical masses, normal ROM, no appreciable thyromegaly; no JVD Respiratory: Diminished to auscultation bilaterally with some coarse breath sounds, no wheezing, rales, rhonchi or crackles. Normal respiratory effort and patient is not tachypenic. No accessory muscle use.  Cardiovascular: RRR, no murmurs / rubs / gallops. S1 and S2 auscultated.  No appreciable extremity edema Abdomen: Soft, non-tender, distended secondary body. Bowel sounds positive.  GU: Deferred. Musculoskeletal: No clubbing / cyanosis of digits/nails.  Has a right BKA Skin: Has pressure ulcers of the left arm and left foot. No induration; Warm and dry.  Neurologic: CN 2-12 grossly intact with no focal deficits. Romberg sign and cerebellar reflexes not assessed.  Psychiatric: Mildly impaired ormal judgment and insight. Alert and oriented x 3. Normal mood and appropriate affect.   Data Reviewed: I have personally reviewed following labs and imaging studies  CBC: Recent Labs  Lab 07/27/20 0913 07/27/20 1053 07/31/20 1204 08/01/20 0321 08/02/20 0306  WBC 8.2  --  12.9* 10.9* 9.0  NEUTROABS  --   --  7.9*  --   --   HGB 12.8* 13.9 12.1* 11.4* 11.0*  HCT  39.6 41.0 38.3* 35.7* 34.2*  MCV 79.8*  --  80.3 80.4 80.3  PLT 547*  --  507* 455* 432*   Basic Metabolic Panel: Recent Labs  Lab 07/27/20 0913 07/27/20 1053 07/31/20 1204 08/01/20 0321 08/02/20 0306  NA 138 141 136 137 135  K 4.1 4.5 3.6 4.0 3.5  CL 104 107 103 105 103  CO2 23  --  25 24 22   GLUCOSE 79 85 82 120* 115*  BUN 9 10 16 15 12   CREATININE 0.53* 0.50* 0.66 0.51* 0.44*  CALCIUM 8.9  --  9.0 9.0 8.6*   GFR: Estimated Creatinine Clearance: 114.2 mL/min (A) (by C-G formula based on SCr of 0.44 mg/dL (L)). Liver Function Tests: Recent Labs  Lab 07/31/20 1204 08/01/20 0321  AST 20 19  ALT 13 12  ALKPHOS 101 103  BILITOT 0.5 0.6  PROT 7.1 6.7  ALBUMIN 2.9* 2.6*   Recent Labs  Lab 07/31/20 1204  LIPASE 25   No results for input(s): AMMONIA in the last 168 hours. Coagulation Profile: No results for input(s): INR, PROTIME in the last 168 hours. Cardiac Enzymes: No results for input(s): CKTOTAL, CKMB, CKMBINDEX, TROPONINI in the last 168 hours. BNP (last 3 results) No results for input(s): PROBNP in the last 8760 hours. HbA1C: No results for input(s): HGBA1C in the last 72 hours. CBG: No results for input(s): GLUCAP in the last 168 hours. Lipid Profile: No results for input(s): CHOL, HDL, LDLCALC, TRIG, CHOLHDL, LDLDIRECT in the last 72 hours. Thyroid Function Tests: No results for input(s): TSH, T4TOTAL, FREET4, T3FREE, THYROIDAB in the last 72 hours. Anemia Panel: No results for input(s): VITAMINB12, FOLATE, FERRITIN, TIBC, IRON, RETICCTPCT in the last 72 hours. Sepsis Labs: Recent Labs  Lab 07/31/20 1204  LATICACIDVEN 1.5    Recent Results (from the past 240 hour(s))  SARS Coronavirus 2 by RT PCR (hospital order, performed in [facility] hospital lab) Nasopharyngeal Nasopharyngeal Swab  Status: None   Collection Time: 07/27/20  8:24 AM   Specimen: Nasopharyngeal Swab  Result Value Ref Range Status   SARS Coronavirus 2 NEGATIVE NEGATIVE Final     Comment: (NOTE) SARS-CoV-2 target nucleic acids are NOT DETECTED.  The SARS-CoV-2 RNA is generally detectable in upper and lower respiratory specimens during the acute phase of infection. The lowest concentration of SARS-CoV-2 viral copies this assay can detect is 250 copies / mL. A negative result does not preclude SARS-CoV-2 infection and should not be used as the sole basis for treatment or other patient management decisions.  A negative result may occur with improper specimen collection / handling, submission of specimen other than nasopharyngeal swab, presence of viral mutation(s) within the areas targeted by this assay, and inadequate number of viral copies (<250 copies / mL). A negative result must be combined with clinical observations, patient history, and epidemiological information.  Fact Sheet for Patients:   BoilerBrush.com.cy  Fact Sheet for Healthcare Providers: https://pope.com/  This test is not yet approved or  cleared by the [place] FDA and has been authorized for detection and/or diagnosis of SARS-CoV-2 by FDA under an Emergency Use Authorization (EUA).  This EUA will remain in effect (meaning this test can be used) for the duration of the COVID-19 declaration under Section 564(b)(1) of the Act, 21 U.S.C. section 360bbb-3(b)(1), unless the authorization is terminated or revoked sooner.  Performed at [facility], [address] [place], [place] [address]   Surgical pcr screen     Status: None   Collection Time: 07/27/20  9:23 AM   Specimen: Nasal Mucosa; Nasal Swab  Result Value Ref Range Status   MRSA, PCR NEGATIVE NEGATIVE Final   Staphylococcus aureus NEGATIVE NEGATIVE Final    Comment: (NOTE) The Xpert SA Assay (FDA approved for NASAL specimens in patients 61 years of age and older), is one component of a comprehensive surveillance program. It is not intended to diagnose infection nor  to guide or monitor treatment. Performed at [facility], [address] [place], [place] [address]   Blood culture (routine x 2)     Status: None (Preliminary result)   Collection Time: 07/31/20 12:04 PM   Specimen: BLOOD  Result Value Ref Range Status   Specimen Description   Final    BLOOD LEFT ARM Performed at [facility], [address], [place], [place] [address]    Special Requests   Final    BOTTLES DRAWN AEROBIC AND ANAEROBIC Blood Culture adequate volume Performed at [facility], [address] [place], [place] [address]    Culture   Final    NO GROWTH 2 DAYS Performed at [facility], [address] [place], [place] [address]    Report Status PENDING  Incomplete  Resp Panel by RT-PCR (Flu A&B, Covid) Nasopharyngeal Swab     Status: None   Collection Time: 07/31/20 12:04 PM   Specimen: Nasopharyngeal Swab; Nasopharyngeal(NP) swabs in vial transport medium  Result Value Ref Range Status   SARS Coronavirus 2 by RT PCR NEGATIVE NEGATIVE Final    Comment: (NOTE) SARS-CoV-2 target nucleic acids are NOT DETECTED.  The SARS-CoV-2 RNA is generally detectable in upper respiratory specimens during the acute phase of infection. The lowest concentration of SARS-CoV-2 viral copies this assay can detect is 138 copies/mL. A negative result does not preclude SARS-Cov-2 infection and should not be used as the sole basis for treatment or other patient  management decisions. A negative result may occur with  improper specimen collection/handling, submission of specimen other than nasopharyngeal swab, presence of viral mutation(s) within the areas targeted by this assay, and inadequate number of viral copies(<138 copies/mL). A negative result must be combined with clinical observations, patient history, and epidemiological information. The expected result is Negative.  Fact Sheet for Patients:   BloggerCourse.com  Fact Sheet for Healthcare Providers:  SeriousBroker.it  This test is no t yet approved or cleared by the [place] FDA and  has been authorized for detection and/or diagnosis of SARS-CoV-2 by FDA under an Emergency Use Authorization (EUA). This EUA will remain  in effect (meaning this test can be used) for the duration of the COVID-19 declaration under Section 564(b)(1) of the Act, 21 U.S.C.section 360bbb-3(b)(1), unless the authorization is terminated  or revoked sooner.       Influenza A by PCR NEGATIVE NEGATIVE Final   Influenza B by PCR NEGATIVE NEGATIVE Final    Comment: (NOTE) The Xpert Xpress SARS-CoV-2/FLU/RSV plus assay is intended as an aid in the diagnosis of influenza from Nasopharyngeal swab specimens and should not be used as a sole basis for treatment. Nasal washings and aspirates are unacceptable for Xpert Xpress SARS-CoV-2/FLU/RSV testing.  Fact Sheet for Patients: BloggerCourse.com  Fact Sheet for Healthcare Providers: SeriousBroker.it  This test is not yet approved or cleared by the [place] FDA and has been authorized for detection and/or diagnosis of SARS-CoV-2 by FDA under an Emergency Use Authorization (EUA). This EUA will remain in effect (meaning this test can be used) for the duration of the COVID-19 declaration under Section 564(b)(1) of the Act, 21 U.S.C. section 360bbb-3(b)(1), unless the authorization is terminated or revoked.  Performed at [facility], [address], [place], [place] [address]   Blood culture (routine x 2)     Status: None (Preliminary result)   Collection Time: 07/31/20 12:19 PM   Specimen: BLOOD RIGHT ARM  Result Value Ref Range Status   Specimen Description   Final    BLOOD RIGHT ARM Performed at [facility], [address], [place], [place] [address]     Special Requests   Final    BOTTLES DRAWN AEROBIC AND ANAEROBIC Blood Culture adequate volume Performed at [facility], [address], [place], [place] [address]    Culture   Final    NO GROWTH 2 DAYS Performed at [facility], [address] [place], [place] [address]    Report Status PENDING  Incomplete  Urine culture     Status: None   Collection Time: 07/31/20  9:10 PM   Specimen: Urine, Random  Result Value Ref Range Status   Specimen Description   Final    URINE, RANDOM Performed at [facility], [address], [place], [place] [address]    Special Requests   Final    NONE Performed at [facility], [address], [place], [place] [address]    Culture   Final    NO GROWTH Performed at [facility], [address] [place], [place] [address]    Report Status 08/02/2020 FINAL  Final     RN Pressure Injury Documentation: Pressure Injury Arm Left;Posterior;Proximal;Upper;Other (Comment) Stage II -  Partial thickness loss of dermis presenting as a shallow open ulcer with a red, pink wound bed without slough. pink open wound on armpit (Active)     Location: Arm  Location Orientation:  Left;Posterior;Proximal;Upper;Other (Comment)  Staging: Stage II -  Partial thickness loss of dermis presenting as a shallow open ulcer with a red, pink wound bed without slough.  Wound Description (Comments): pink open wound on armpit  Present on Admission: Yes     Pressure Injury 07/31/20 Foot Left;Medial Stage 3 -  Full thickness tissue loss. Subcutaneous fat may be visible but bone, tendon or muscle are NOT exposed. (Active)  07/31/20 2152  Location: Foot  Location Orientation: Left;Medial  Staging: Stage 3 -  Full thickness tissue loss. Subcutaneous fat may be visible but bone, tendon or muscle are NOT exposed.  Wound Description (Comments):   Present on Admission: Yes     Pressure Injury 08/01/20 Knee  Left;Medial Stage 1 -  Intact skin with non-blanchable redness of a localized area usually over a bony prominence. (Active)  08/01/20   Location: Knee  Location Orientation: Left;Medial  Staging: Stage 1 -  Intact skin with non-blanchable redness of a localized area usually over a bony prominence.  Wound Description (Comments):   Present on Admission: Yes     Pressure Injury 08/01/20 Sacrum Right Stage 1 -  Intact skin with non-blanchable redness of a localized area usually over a bony prominence. (Active)  08/01/20   Location: Sacrum  Location Orientation: Right  Staging: Stage 1 -  Intact skin with non-blanchable redness of a localized area usually over a bony prominence.  Wound Description (Comments):   Present on Admission: Yes     Estimated body mass index is 27.86 kg/m as calculated from the following:   Height as of this encounter:  (1.88 m).   Weight as of this encounter: 98.4 kg.  Malnutrition Type:   Malnutrition Characteristics:   Nutrition Interventions:    Radiology Studies: DG ESOPHAGUS W SINGLE CM (SOL OR THIN BA)  Result Date: 08/02/2020 CLINICAL DATA:  Difficulty swallowing, history of slipped Nissen fundoplication. EXAM: ESOPHOGRAM/BARIUM SWALLOW TECHNIQUE: Single contrast examination was performed using  thin barium. FLUOROSCOPY TIME:  Fluoroscopy Time:  2 minutes 42 seconds Radiation Exposure Index (if provided by the fluoroscopic device): 34 mGy Number of Acquired Spot Images: 1 COMPARISON:  CT chest 07/31/2020. FINDINGS: Patient was mainly imaged in the recumbent RPO position due to current condition fairly normal esophageal motility. No esophageal fold thickening or obstruction. The gastroesophageal junction was difficult to fully visualize due to limited mobility. A 13 mm barium tablet did pass into the stomach. There is a moderate hiatal hernia. IMPRESSION: 1. Limited examination due to patient condition. 2. Moderate hiatal hernia. Electronically Signed    By: Leanna Battles M.D.   On: 08/02/2020 11:37   Scheduled Meds: . aspirin EC  81 mg Oral BID  . azithromycin  500 mg Oral Q supper  . baclofen  20 mg Oral TID  . divalproex  500 mg Oral BID  . enoxaparin (LOVENOX) injection  40 mg Subcutaneous Q24H  . pantoprazole  40 mg Oral Daily  . risperiDONE  2 mg Oral QHS  . tamsulosin  0.4 mg Oral Daily   Continuous Infusions: . cefTRIAXone (ROCEPHIN)  IV 2 g (08/02/20 1526)    LOS: 2 days   Merlene Laughter, DO [facility] PAGER is on AMION  If 7PM-7AM, please contact night-coverage [email or web address]

## 2020-08-02 NOTE — Progress Notes (Addendum)
Speech Language Pathology Treatment: Dysphagia  Patient Details Name: [name] MRN: [id number] DOB: Nov 06, 1960 Today's Date: 08/02/2020 Time: 1215-1300 SLP Time Calculation (min) (ACUTE ONLY): 45 min  Assessment / Plan / Recommendation Clinical Impression  Pt seen at bedside for skilled ST intervention targeting goals for safe PO intake/diet tolerance. Pt reported needing to have a bowel movement, but having difficulty doing so. RN and MD informed. Pt was given warmed prune juice via straw, and was noted to exhibit cough response while not seated as upright as possible (pt was on bed pan at this time). Once pt had been taken off bedpan and seated upright, he tolerated prune juice without cough response. NT reports pt tolerated breakfast and lunch without overt s/s aspiration. Pt required cues to recall education provided by SLP yesterday for small bites/sips, upright position, oral suction to insure clearing of oral cavity, remaining upright for 30 minutes to facilitate esophageal clearing. Safe swallow precautions posted at [facility] and nursing aware. SLP will continue to follow for education.    HPI HPI: 60 yo male with h/o CP admit to [facility] with n/v for 2 days PTA.  Pt has significant wounds and has undergone multiple procedures.  Current CT chest showed moderate pleural effusions, mild opacity/ATX at right lung, large hiatal hernia with post-surgical changes.  His CT of chest 03/14/2014 showed right base consolidation, appearance of ATX/infiltrate, large HH, scolios.   Pt resides a group home.  He has had prior Nissen that per notes has "slipped".  Prior endoscopy 07/2018 showed Moderate-large hiatal hernia with slipped Nissen's fundoplication, Patent Billroth II gastrojejunostomy was found, characterized by healthy appearing mucosa, Moderate gastritis of the gastric remnant.--  H/o anemia.     Swallow eval ordered.  Per endoscopy 07/2018, pt with tortuous lower 1/3 of esophagus, gastritis, hiatal  hernia, etc.      SLP Plan  Continue with current plan of care       Recommendations  Diet recommendations: Dysphagia 3 (mechanical soft);Thin liquid Liquids provided via: Straw Medication Administration: Other (Comment) (cut large pills in half if able, or crushed with sweetened applesauce) Supervision: Full supervision/cueing for compensatory strategies Compensations: Slow rate;Small sips/bites;Other (Comment) Postural Changes and/or Swallow Maneuvers:  Seated upright 90 degrees;Upright 30-60 min after meal                Oral Care Recommendations: Oral care BID Follow up Recommendations: 24 hour supervision/assistance SLP Visit Diagnosis: Dysphagia, unspecified (R13.10) Plan: Continue with current plan of care       GO               Karen Huhta B. Murvin Natal, [facility], CCC-SLP Speech Language Pathologist Office: [phone] Pager: [phone]  Leigh Aurora 08/02/2020, 1:27 PM

## 2020-08-02 NOTE — Progress Notes (Signed)
Provided update to patients group home supervisor [place].

## 2020-08-02 NOTE — Progress Notes (Signed)
OT Cancellation Note  Patient Details Name: [name] MRN: [id number] DOB: Sep 28, 1960   Cancelled Treatment:    Reason Eval/Treat Not Completed: OT screened, no needs identified, will sign off. Per chart review patient requires total assist for ADLs and mobility at baseline.   Lorinda Copland L Nuala Chiles 08/02/2020, 1:25 PM

## 2020-08-03 ENCOUNTER — Inpatient Hospital Stay (HOSPITAL_COMMUNITY): Payer: Medicare Other

## 2020-08-03 LAB — CBC WITH DIFFERENTIAL/PLATELET
Abs Immature Granulocytes: 0.11 10*3/uL — ABNORMAL HIGH (ref 0.00–0.07)
Basophils Absolute: 0.1 10*3/uL (ref 0.0–0.1)
Basophils Relative: 1 %
Eosinophils Absolute: 0.4 10*3/uL (ref 0.0–0.5)
Eosinophils Relative: 4 %
HCT: 38.8 % — ABNORMAL LOW (ref 39.0–52.0)
Hemoglobin: 12.1 g/dL — ABNORMAL LOW (ref 13.0–17.0)
Immature Granulocytes: 1 %
Lymphocytes Relative: 33 %
Lymphs Abs: 2.9 10*3/uL (ref 0.7–4.0)
MCH: 25.5 pg — ABNORMAL LOW (ref 26.0–34.0)
MCHC: 31.2 g/dL (ref 30.0–36.0)
MCV: 81.7 fL (ref 80.0–100.0)
Monocytes Absolute: 1 10*3/uL (ref 0.1–1.0)
Monocytes Relative: 11 %
Neutro Abs: 4.3 10*3/uL (ref 1.7–7.7)
Neutrophils Relative %: 50 %
Platelets: 435 10*3/uL — ABNORMAL HIGH (ref 150–400)
RBC: 4.75 MIL/uL (ref 4.22–5.81)
RDW: 19.4 % — ABNORMAL HIGH (ref 11.5–15.5)
WBC: 8.7 10*3/uL (ref 4.0–10.5)
nRBC: 0 % (ref 0.0–0.2)

## 2020-08-03 LAB — COMPREHENSIVE METABOLIC PANEL
ALT: 18 U/L (ref 0–44)
AST: 21 U/L (ref 15–41)
Albumin: 2.6 g/dL — ABNORMAL LOW (ref 3.5–5.0)
Alkaline Phosphatase: 110 U/L (ref 38–126)
Anion gap: 11 (ref 5–15)
BUN: 14 mg/dL (ref 6–20)
CO2: 24 mmol/L (ref 22–32)
Calcium: 9.3 mg/dL (ref 8.9–10.3)
Chloride: 104 mmol/L (ref 98–111)
Creatinine, Ser: 0.46 mg/dL — ABNORMAL LOW (ref 0.61–1.24)
GFR, Estimated: >60 mL/min (ref >60)
Glucose, Bld: 91 mg/dL (ref 70–99)
Potassium: 5 mmol/L (ref 3.5–5.1)
Sodium: 139 mmol/L (ref 135–145)
Total Bilirubin: 0.5 mg/dL (ref 0.3–1.2)
Total Protein: 6.7 g/dL (ref 6.5–8.1)

## 2020-08-03 LAB — PHOSPHORUS: Phosphorus: 3.6 mg/dL (ref 2.5–4.6)

## 2020-08-03 LAB — MAGNESIUM: Magnesium: 2.2 mg/dL (ref 1.7–2.4)

## 2020-08-03 MED ORDER — CEFDINIR 300 MG PO CAPS
300.0000 mg | ORAL_CAPSULE | Freq: Two times a day (BID) | ORAL | Status: DC
  Administered 2020-08-03 (×2): 300 mg via ORAL
  Filled 2020-08-03 (×3): qty 1

## 2020-08-03 MED ORDER — ONDANSETRON HCL 4 MG PO TABS: 4.0000 mg | ORAL_TABLET | Freq: Four times a day (QID) | ORAL | 0 refills | Status: AC | PRN

## 2020-08-03 MED ORDER — BISACODYL 10 MG RE SUPP: 10.0000 mg | Freq: Every day | RECTAL | 0 refills | Status: DC | PRN

## 2020-08-03 MED ORDER — SENNOSIDES-DOCUSATE SODIUM 8.6-50 MG PO TABS: 1.0000 | ORAL_TABLET | Freq: Every day | ORAL | Status: DC

## 2020-08-03 MED ORDER — AZITHROMYCIN 250 MG PO TABS: 500.0000 mg | ORAL_TABLET | Freq: Every day | ORAL | 0 refills | Status: AC

## 2020-08-03 MED ORDER — CEFDINIR 300 MG PO CAPS: 300.0000 mg | ORAL_CAPSULE | Freq: Two times a day (BID) | ORAL | 0 refills | Status: AC

## 2020-08-03 NOTE — Progress Notes (Signed)
Pt tolerated breakfast well, ate 100% with little bit of assistance from RN. Pt is able to hold the fork and feed himself. Made Pt sit upright during breakfast. No s/s of aspiration noted.

## 2020-08-03 NOTE — Progress Notes (Signed)
Provided notification to [name] patients POA that he will be discharging back to the group home.

## 2020-08-03 NOTE — Care Management Important Message (Signed)
Important Message  Patient Details IM Letter given to the Patient. Name: YAHEL FUSTON MRN: [id number] Date of Birth: 07/30/1960   Medicare Important Message Given:  Yes     Caren Macadam 08/03/2020, 11:03 AM

## 2020-08-03 NOTE — Progress Notes (Signed)
Report called to Phyis at [facility]"  PM meds given. VS stable. Informed of wound care and skin care. Leaving Floor at this time. Transferred care to [facility].

## 2020-08-03 NOTE — Discharge Summary (Signed)
Physician Discharge Summary  BRADLY SANGIOVANNI WJX:[id number] DOB: 1961-01-02 DOA: 07/31/2020  PCP: Harvest Forest, MD  Admit date: 07/31/2020 Discharge date: 08/03/2020  Admitted From: Group Home Disposition: Group Home  Recommendations for Outpatient Follow-up:  1. Follow up with PCP in 1-2 weeks 2. Follow up with Orthopedic Surgery Dr. Victorino Dike in 2 weeks 3. Follow up with Gastroenterology within 4 weeks to follow Hiatial Hernia  4. Please obtain CMP/CBC, Mag, Phos in one week 5. 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 Patient currently lives in a group home and was brought in from there with complaints of a cough is ongoing for last few days and developing nausea and vomiting on the morning of admission. He has been a poor historian and CT of the chest was done and did not show any PE but raised concern for pneumonia in the right lung and patient was hospitalized for further management. Currently he is on a dysphagia 3 diet and currently having his bowel regimen adjusted and currently being treated for his pneumonia is improved.  Patient had a large bowel movement yesterday and nausea vomiting has improved significantly and he is back to baseline tolerating his diet.  Sister was updated and he is stable to be discharged home at this time at his group home and will change his  antibiotics to p.o.  He will need to follow-up with his orthopedic surgeon and will need a referral for GI/general surgery to follow his hiatal hernia.   Discharge Diagnoses:  Active Problems:   CAP (community acquired pneumonia)   Cerebral palsy, quadriplegic (HCC)   Nausea and vomiting  Community-acquired versus aspiration pneumonia with right-sided pleural effusion -Likely has parapneumonic pleural effusion. Patient started on ceftriaxone and azithromycin.  -Initiated on Aspiration precautions. Speech therapy to see. No evidence for sepsis at this time. -COVID-19 and influenza testing was negative. -Does not have any oxygen requirements currently. -WBC has improved from 12.9 and trended down to 9.0 yesterday and today it is 8.7 -We will need a repeat chest x-ray prior to D/C and now showed "Right pleural effusion and airspace disease have markedly improved. The left lung is clear. No pneumothorax. Heart size is normal. Contrast in the colon from patient's esophagram 08/02/2020 noted."  -Butbecause of his initialnausea vomiting he will not be able to tolerate antibiotics orally but now he is improved and will change his antibiotics to by mouth to Cefdinir and Azithromycin -Continue antibiotic course to completion and will need to repeat chest x-ray in 3 to 6 weeks  Nausea and vomiting/abdominal pain, significantly improved -Could be due to pneumoniaand is improving. -The day before yesterday there was tenderness over the suprapubic area but patient did not have any significant urinary retention. Not as tender today. Will check a bladder scan prior to going and he was not retaining and only had 187 mL  -He had one episode of vomiting earlier overnightfrom 3/7-3/8but does not have any nausea this morning. -Ifpatient continues to have  nausea with vomiting we may have to do abdominal imaging studies. His abdomen however is benign on examination. His LFTs and lipase levels  were normal. UA did not suggest any infection -Continue with antiemetics with Zofran 4 mg p.o./IV every 6 as needed for nausea -This could be in the setting of constipation and now that his constipation has resolved given that he abdominal yesterday he feels better his abdomen is soft and nontender on the examination -We will continue bowel regimen at discharge  -Patient does have a moderate hiatal hernia noted on his modified barium swallow which will need to be followed in outpatient setting by GI/General Surgery   History of cerebral palsy with anxiety disorder and bipolar disorder -Noted to be onrisperidone 2 mg p.o. nightly, lorazepam 1 mg p.o. every 4 hours as needed anxiety, as well as divalproex 500 large p.o. twice daily and baclofen 20 mg p.o. 3 times daily, -C/w rest of home medications  -Patient is nonambulatory and gets around in a wheelchair. Depakote level was 41.  Recent right below-knee amputation -Right leg is still in dressing. -The surgery was done by Dr. Victorino Dike on March 3. According to family members dressing is supposed to stay in place till follow-up with Dr. Victorino Dike in 2 weeks. -Current pain control with oxycodone IR 5 mg p.o. every 6 as needed for severe pain -PT and OT have signed off as patient is total care assist for ADLs and mobility at baseline  NormocyticAnemia Slight drop in hemoglobin is likely dilutionalas patient's hemoglobin/hematocrit went from 12.1/38.3 -> 11.4/35.7 -> 11.0/34.2 and is stable at 12.1/38.8 today -Patient has chronic anemia and is on iron supplements at home with Niferex which will be continued at D/C . -Continue to monitor for signs and symptoms of bleeding;currently no evidence of overt bleedingbut he has had some bloody bowel movements at times. -May consider FOBT if hemoglobin continues to drop further  Constipation -Per discussion with the staff he is having difficulty having successful bowel movements andare at times  bloody and hard -We will initiate stool softener with MiraLAX twice daily scheduled, senna docusate 1 tab p.o. twice daily, and add bisacodyl 10 mg rectal suppository -May need an enema if necessary however patient had a very large bowel movement yesterday and will continue bowel regimen at discharge  Thrombocytosis -Likely reactive and patient's platelet count is trending up from 507 and is now 432 yesterday and today is 435  -Continue to monitor for signs or symptoms of bleeding -Repeat CBC within 1 week  Multiple decubitus ulcers Wound care nurse to evaluateand please see her note for comfortable treatment recommendations  Pressure Injury 08/10/18 Stage II - Partial thickness loss of dermis presenting as a shallow open ulcer with a red, pink wound bed without slough. (Active)  08/10/18 1300  Location: Ischial tuberosity  Location Orientation: Right  Staging: Stage II - Partial thickness loss of dermis presenting as a shallow open ulcer with a red, pink wound bed without slough.  Wound Description (Comments):   Present on Admission: Yes    Pressure Injury 04/08/19 Coccyx Stage II - Partial thickness loss of dermis presenting as a shallow open ulcer with a red, pink wound bed without slough. (Active)  04/08/19 2000  Location: Coccyx  Location Orientation:   Staging: Stage II - Partial thickness loss of dermis presenting as a shallow open ulcer with a red, pink wound bed without slough.  Wound Description (Comments):   Present on Admission: Yes    Pressure Injury  Arm Left;Posterior;Proximal;Upper;Other (Comment) Stage II - Partial thickness loss of dermis presenting as a shallow open ulcer with a red, pink wound bed without slough. pink open wound on armpit (Active)    Location: Arm  Location Orientation: Left;Posterior;Proximal;Upper;Other (Comment)  Staging: Stage II - Partial thickness loss of dermis presenting as a shallow open ulcer with a red, pink wound bed without  slough.  Wound Description (Comments): pink open wound on armpit  Present on Admission: Yes    Pressure Injury 04/08/19 Arm Left Stage II - Partial thickness loss of dermis presenting as a shallow open ulcer with a red, pink wound bed without slough. Pink open wound under armpit. (Active)  04/08/19 2000  Location: Arm  Location Orientation: Left  Staging: Stage II - Partial thickness loss of dermis presenting as a shallow open ulcer with a red, pink wound bed without slough.  Wound Description (Comments): Pink open wound under armpit.  Present on Admission: Yes    Pressure Injury 04/08/19 Ankle Left;Lateral Unstageable - Full thickness tissue loss in which the base of the ulcer is covered by slough (yellow, tan, gray, green or brown) and/or eschar (tan, brown or black) in the wound bed. black scab area with surrou (Active)  04/08/19 2000  Location: Ankle  Location Orientation: Left;Lateral  Staging: Unstageable - Full thickness tissue loss in which the base of the ulcer is covered by slough (yellow, tan, gray, green or brown) and/or eschar (tan, brown or black) in the wound bed.  Wound Description (Comments): black scab area with surrounding redness  Present on Admission: Yes    Pressure Injury 04/08/19 Foot Right;Medial;Anterior Unstageable - Full thickness tissue loss in which the base of the ulcer is covered by slough (yellow, tan, gray, green or brown) and/or eschar (tan, brown or black) in the wound bed. outer aspect of rig (Active)  04/08/19 2000  Location: Foot  Location Orientation: Right;Medial;Anterior  Staging: Unstageable - Full thickness tissue loss in which the base of the ulcer is covered by slough (yellow, tan, gray, green or brown) and/or eschar (tan, brown or black) in the wound bed.  Wound Description (Comments): outer aspect of right foot, scab area with surrounding redness  Present on Admission: Yes    Pressure Injury 07/31/20 Foot Anterior;Left (Active)   07/31/20 2152  Location: Foot  Location Orientation: Anterior;Left  Staging:   Wound Description (Comments):   Present on Admission: Yes   Discharge Instructions  Discharge Instructions    Call MD for:  difficulty breathing, headache or visual disturbances   Complete by: As directed    Call MD for:  extreme fatigue   Complete by: As directed    Call MD for:  hives   Complete by: As directed    Call MD for:  persistant dizziness or light-headedness   Complete by: As directed    Call MD for:  persistant nausea and vomiting   Complete by: As directed    Call MD for:  redness, tenderness, or signs of infection (pain, swelling, redness, odor or green/yellow discharge around incision site)   Complete by: As directed    Call MD for:  severe uncontrolled pain   Complete by: As directed    Call MD for:  temperature >100.4   Complete by: As directed    Diet - low sodium heart healthy   Complete by: As directed    DYSPHAGIA 3 DIET WITH THIN LIQUIDS   Discharge instructions   Complete by: As directed  You were cared for by a hospitalist during your hospital stay. If you have any questions about your discharge medications or the care you received while you were in the hospital after you are discharged, you can call the unit and ask to speak with the hospitalist on call if the hospitalist that took care of you is not available. Once you are discharged, your primary care physician will handle any further medical issues. Please note that NO REFILLS for any discharge medications will be authorized once you are discharged, as it is imperative that you return to your primary care physician (or establish a relationship with a primary care physician if you do not have one) for your aftercare needs so that they can reassess your need for medications and monitor your lab values.  Follow up with PCP, Orthopedic Surgery, and Gastroenterology. Take all medications as prescribed. If symptoms change or  worsen please return to the ED for evaluation   Discharge wound care:   Complete by: As directed    Wound care  Daily      Comments: Cut piece of Aquacel [place]([facility] # [phone]) and apply to left inner ankle Q day, then cover with foam dressing.  (Change foam dressing Q 3 days or PRN soiling.)  08/01/20 1130   08/01/20 1129   Foam dressing  Until discontinued      Comments: Foam dressing to left knee, sacrum/buttocks; change Q 3 days or PRN soiling   Increase activity slowly   Complete by: As directed      Allergies as of 08/03/2020      Reactions   Adhesive [tape] Rash      Medication List    TAKE these medications   acetaminophen 500 MG tablet Commonly known as: TYLENOL Take 500 mg by mouth every 6 (six) hours as needed for moderate pain.   albuterol 108 (90 Base) MCG/ACT inhaler Commonly known as: VENTOLIN HFA Inhale 2 puffs into the lungs every 4 (four) hours as needed for shortness of breath or wheezing.   alum & mag hydroxide-simeth 200-200-20 MG/5ML suspension Commonly known as: MAALOX/MYLANTA Take 30 mLs by mouth every 6 (six) hours as needed for indigestion or heartburn. Use as directed as needed for nausea/indigestion per standing order   ascorbic acid 250 MG tablet Commonly known as: VITAMIN C Take 1 tablet (250 mg total) by mouth daily.   aspirin EC 81 MG tablet Take 1 tablet (81 mg total) by mouth 2 (two) times daily.   azithromycin 250 MG tablet Commonly known as: ZITHROMAX Take 2 tablets (500 mg total) by mouth daily for 2 days.   baclofen 20 MG tablet Commonly known as: LIORESAL Take 1 tablet (20 mg total) by mouth 3 (three) times daily.   bisacodyl 10 MG suppository Commonly known as: DULCOLAX Place 1 suppository (10 mg total) rectally daily as needed for moderate constipation.   brompheniramine-pseudoephedrine 1-15 MG/5ML Elix Commonly known as: DIMETAPP Take 10 mLs by mouth daily as needed (Cold sympton).   cefdinir 300 MG capsule Commonly known  as: OMNICEF Take 1 capsule (300 mg total) by mouth every 12 (twelve) hours for 2 days.   cetirizine 10 MG tablet Commonly known as: ZYRTEC Take 1 tablet (10 mg total) by mouth daily. What changed: when to take this   clotrimazole 1 % cream Commonly known as: LOTRIMIN Apply 1 application topically 2 (two) times daily.   divalproex 125 MG capsule Commonly known as: DEPAKOTE SPRINKLE Take 500 mg by mouth 2 (two)  times daily.   fluticasone 50 MCG/ACT nasal spray Commonly known as: FLONASE Place 1 spray into both nostrils daily.   guaiFENesin-dextromethorphan 100-10 MG/5ML syrup Commonly known as: ROBITUSSIN DM Take 10 mLs by mouth every 4 (four) hours as needed for cough.   hydrocortisone cream 1 % APPLY TOPICALLY AT BEDTIME AS NEEDED FOR ITCHING What changed: See the new instructions.   iron polysaccharides 150 MG capsule Commonly known as: NIFEREX Take 150 mg by mouth daily at 12 noon.   Jobst Anti-Em Knee High Med Misc 1 each by Does not apply route daily. Apply once in the morning and remove at bedtime daily   ketoconazole 2 % shampoo Commonly known as: NIZORAL APPLY TOPICALLY TO AFFECTED AREA(S) TWICE WEEKLY AS DIRECTED What changed: See the new instructions.   LORazepam 2 MG tablet Commonly known as: ATIVAN Take 1 mg by mouth every 4 (four) hours as needed for anxiety.   mupirocin ointment 2 % Commonly known as: BACTROBAN Apply 1 application topically daily as needed (wound care).   omeprazole 40 MG capsule Commonly known as: PRILOSEC Take 1 capsule (40 mg total) by mouth 2 (two) times daily.   ondansetron 4 MG tablet Commonly known as: ZOFRAN Take 1 tablet (4 mg total) by mouth every 6 (six) hours as needed for nausea.   oxyCODONE 5 MG immediate release tablet Commonly known as: Oxy IR/ROXICODONE Take 5 mg by mouth every 6 (six) hours as needed for severe pain.   oyster calcium 500 MG Tabs tablet Take 1,500 mg of elemental calcium by mouth daily.    polyethylene glycol 17 g packet Commonly known as: MiraLax Take 17 g by mouth daily as needed for moderate constipation. What changed: additional instructions   Prevalon Heel Protector Misc 1 Device by Does not apply route daily.   risperiDONE 2 MG tablet Commonly known as: RISPERDAL Take 2 mg by mouth at bedtime.   senna-docusate 8.6-50 MG tablet Commonly known as: Senokot-S Take 1 tablet by mouth at bedtime.   tamsulosin 0.4 MG Caps capsule Commonly known as: FLOMAX TAKE 1 CAPSULE BY MOUTH EVERY DAY *DO NOT CRUSH* What changed: See the new instructions.   tolterodine 2 MG 24 hr capsule Commonly known as: Detrol LA Take 1 capsule (2 mg total) by mouth daily.   tretinoin 0.025 % gel Commonly known as: RETIN-A Apply 1 application topically at bedtime. Apply topically to face area   TRIPLE ANTIBIOTIC EX Apply 1 application topically daily as needed (cuts/scrapes/scratches).   Vitamin D3 125 MCG (5000 UT) Caps Take 5,000 Units by mouth daily.   Voltaren 1 % Gel Generic drug: diclofenac Sodium APPLY 4 GRAMS TOPICALLY TO AFFECTED AREA(S) FOUR TIMES DAILY What changed: See the new instructions.   Zinc Oxide 40 % Pste Apply 1 application topically 2 (two) times daily as needed (unspecified). Desitin Max strength paste apply thin layer topically to buttocks and scrotum            Discharge Care Instructions  (From admission, onward)         Start     Ordered   08/03/20 0000  Discharge wound care:       Comments: Wound care  Daily      Comments: Cut piece of Aquacel [place] # [phone]) and apply to left inner ankle Q day, then cover with foam dressing.  (Change foam dressing Q 3 days or PRN soiling.)  08/01/20 1130   08/01/20 1129   Foam dressing  Until discontinued  Comments: Foam dressing to left knee, sacrum/buttocks; change Q 3 days or PRN soiling   08/03/20 1259          Follow-up Information    Bakare, Mobolaji B, MD. Call.   Specialty: Internal  Medicine Why: Follow up within 1-2 weeks Contact information: [address] [place]ae[place] G[place] [address] [phone]              Allergies  Allergen Reactions  . Adhesive [Tape] Rash   Consultations:  None  Procedures/Studies: CT Angio Chest PE W and/or Wo Contrast  Result Date: 07/31/2020 CLINICAL DATA:  Shortness of breath, cough, pneumonia versus chest pain EXAM: CT ANGIOGRAPHY CHEST WITH CONTRAST TECHNIQUE: Multidetector CT imaging of the chest was performed using the standard protocol during bolus administration of intravenous contrast. Multiplanar CT image reconstructions and MIPs were obtained to evaluate the vascular anatomy. CONTRAST:  54mL OMNIPAQUE IOHEXOL 350 MG/ML SOLN COMPARISON:  CT chest dated 03/13/2014 FINDINGS: Cardiovascular: Satisfactory opacification the pulmonary arteries to the lobar level. No evidence of pulmonary embolism. Although not tailored for evaluation of the thoracic aorta, there is no evidence of thoracic aortic aneurysm or dissection. The heart is normal in size.  No pericardial effusion. Mild coronary atherosclerosis of the LAD. Mediastinum/Nodes: No suspicious mediastinal lymphadenopathy. Visualized thyroid is unremarkable. Lungs/Pleura: Evaluation of the lung parenchyma is constrained by respiratory motion. Moderate right and trace left pleural effusions. Mild patchy opacity/atelectasis in the right upper, middle, and lower lobes. No frank interstitial edema. No suspicious pulmonary nodules. No pneumothorax. Upper Abdomen: Visualized upper abdomen is notable for a large hiatal hernia with postsurgical changes at the GE junction. Musculoskeletal: Degenerative changes of the visualized thoracolumbar spine. Review of the MIP images confirms the above findings. IMPRESSION: No evidence of pulmonary embolism. Moderate right and trace left pleural effusions. No frank interstitial edema. Mild patchy opacity/atelectasis in the right lung, as above.  Large hiatal hernia with postsurgical changes at the GE junction. Electronically Signed   By: Charline Bills M.D.   On: 07/31/2020 15:01   DG Chest Portable 1 View  Result Date: 07/31/2020 CLINICAL DATA:  Chest pain and cough. EXAM: PORTABLE CHEST 1 VIEW COMPARISON:  April 08, 2019 FINDINGS: The study is limited secondary to difficult patient positioning. Mild to moderate severity airspace disease is suspected within the right lung base with a small right pleural effusion. The left lung is clear. No pneumothorax is identified. The heart size and mediastinal contours are within normal limits. There is a large gastric hernia with radiopaque surgical clips seen just below the expected region of the esophageal hiatus. Degenerative changes seen within the thoracic spine. IMPRESSION: 1. Limited study with suspected right basilar airspace disease and a small right pleural effusion. 2. Large gastric hernia. Electronically Signed   By: Aram Candela M.D.   On: 07/31/2020 11:31   DG ESOPHAGUS W SINGLE CM (SOL OR THIN BA)  Result Date: 08/02/2020 CLINICAL DATA:  Difficulty swallowing, history of slipped Nissen fundoplication. EXAM: ESOPHOGRAM/BARIUM SWALLOW TECHNIQUE: Single contrast examination was performed using  thin barium. FLUOROSCOPY TIME:  Fluoroscopy Time:  2 minutes 42 seconds Radiation Exposure Index (if provided by the fluoroscopic device): 34 mGy Number of Acquired Spot Images: 1 COMPARISON:  CT chest 07/31/2020. FINDINGS: Patient was mainly imaged in the recumbent RPO position due to current condition fairly normal esophageal motility. No esophageal fold thickening or obstruction. The gastroesophageal junction was difficult to fully visualize due to limited mobility. A 13 mm barium tablet did  pass into the stomach. There is a moderate hiatal hernia. IMPRESSION: 1. Limited examination due to patient condition. 2. Moderate hiatal hernia. Electronically Signed   By: Leanna Battles M.D.   On:  08/02/2020 11:37     Subjective: Seen and examined at bedside and he was feeling well and was back to his baseline.  Tolerated his diet without issues and had no more nausea vomiting.  Had very large bowel movement yesterday and feels much better.  No other concerns or complaints at this time.  Discharge Exam: Vitals:   08/02/20 2156 08/03/20 0535  BP: 114/65 129/81  Pulse: 78 69  Resp: 16 18  Temp: 98.1 F (36.7 C) 98.4 F (36.9 C)  SpO2: 99% 100%   Vitals:   08/02/20 0548 08/02/20 1256 08/02/20 2156 08/03/20 0535  BP: 120/71 124/62 114/65 129/81  Pulse: 62 80 78 69  Resp: 16 19 16 18   Temp: 98.5 F (36.9 C) 98.6 F (37 C) 98.1 F (36.7 C) 98.4 F (36.9 C)  TempSrc: Oral Oral Oral   SpO2: 100% 100% 99% 100%  Weight:      Height:       General: Pt is alert, awake, not in acute distress Cardiovascular: RRR, S1/S2 +, no rubs, no gallops Respiratory: Diminished bilaterally, no wheezing, no rhonchi; unlabored breathing and not wearing supplemental oxygen via nasal cannula Abdominal: Soft, NT, distended secondary to body habitus, bowel sounds + Extremities: Has a right BKA and a left ankle wound.  The results of significant diagnostics from this hospitalization (including imaging, microbiology, ancillary and laboratory) are listed below for reference.    Microbiology: Recent Results (from the past 240 hour(s))  SARS Coronavirus 2 by RT PCR (hospital order, performed in [facility] hospital lab) Nasopharyngeal Nasopharyngeal Swab     Status: None   Collection Time: 07/27/20  8:24 AM   Specimen: Nasopharyngeal Swab  Result Value Ref Range Status   SARS Coronavirus 2 NEGATIVE NEGATIVE Final    Comment: (NOTE) SARS-CoV-2 target nucleic acids are NOT DETECTED.  The SARS-CoV-2 RNA is generally detectable in upper and lower respiratory specimens during the acute phase of infection. The lowest concentration of SARS-CoV-2 viral copies this assay can detect is 250 copies / mL. A  negative result does not preclude SARS-CoV-2 infection and should not be used as the sole basis for treatment or other patient management decisions.  A negative result may occur with improper specimen collection / handling, submission of specimen other than nasopharyngeal swab, presence of viral mutation(s) within the areas targeted by this assay, and inadequate number of viral copies (<250 copies / mL). A negative result must be combined with clinical observations, patient history, and epidemiological information.  Fact Sheet for Patients:   09/26/20  Fact Sheet for Healthcare Providers: BoilerBrush.com.cy  This test is not yet approved or  cleared by the https://pope.com/ FDA and has been authorized for detection and/or diagnosis of SARS-CoV-2 by FDA under an Emergency Use Authorization (EUA).  This EUA will remain in effect (meaning this test can be used) for the duration of the COVID-19 declaration under Section 564(b)(1) of the Act, 21 U.S.C. section 360bbb-3(b)(1), unless the authorization is terminated or revoked sooner.  Performed at [facility], [address], [place], [place]   Surgical pcr screen     Status: None   Collection Time: 07/27/20  9:23 AM   Specimen: Nasal Mucosa; Nasal Swab  Result Value Ref Range Status   MRSA, PCR  NEGATIVE NEGATIVE Final   Staphylococcus aureus NEGATIVE NEGATIVE Final    Comment: (NOTE) The Xpert SA Assay (FDA approved for NASAL specimens in patients 60 years of age and older), is one component of a comprehensive surveillance program. It is not intended to diagnose infection nor to guide or monitor treatment. Performed at [facility], [address] [place], [place] [address]   Blood culture (routine x 2)     Status: None (Preliminary result)   Collection Time: 07/31/20 12:04 PM   Specimen: BLOOD  Result Value Ref Range Status   Specimen Description    Final    BLOOD LEFT ARM Performed at [facility], [address] [place], [place] [address]    Special Requests   Final    BOTTLES DRAWN AEROBIC AND ANAEROBIC Blood Culture adequate volume Performed at [facility], [address] [place], [place] [address]    Culture   Final    NO GROWTH 2 DAYS Performed at [facility], [address], [place], [place] [address]    Report Status PENDING  Incomplete  Resp Panel by RT-PCR (Flu A&B, Covid) Nasopharyngeal Swab     Status: None   Collection Time: 07/31/20 12:04 PM   Specimen: Nasopharyngeal Swab; Nasopharyngeal(NP) swabs in vial transport medium  Result Value Ref Range Status   SARS Coronavirus 2 by RT PCR NEGATIVE NEGATIVE Final    Comment: (NOTE) SARS-CoV-2 target nucleic acids are NOT DETECTED.  The SARS-CoV-2 RNA is generally detectable in upper respiratory specimens during the acute phase of infection. The lowest concentration of SARS-CoV-2 viral copies this assay can detect is 138 copies/mL. A negative result does not preclude SARS-Cov-2 infection and should not be used as the sole basis for treatment or other patient management decisions. A negative result may occur with  improper specimen collection/handling, submission of specimen other than nasopharyngeal swab, presence of viral mutation(s) within the areas targeted by this assay, and inadequate number of viral copies(<138 copies/mL). A negative result must be combined with clinical observations, patient history, and epidemiological information. The expected result is Negative.  Fact Sheet for Patients:  BloggerCourse.com  Fact Sheet for Healthcare Providers:  SeriousBroker.it  This test is no t yet approved or cleared by the [place] FDA and  has been authorized for detection and/or diagnosis of SARS-CoV-2 by FDA under an Emergency Use Authorization (EUA). This EUA  will remain  in effect (meaning this test can be used) for the duration of the COVID-19 declaration under Section 564(b)(1) of the Act, 21 U.S.C.section 360bbb-3(b)(1), unless the authorization is terminated  or revoked sooner.       Influenza A by PCR NEGATIVE NEGATIVE Final   Influenza B by PCR NEGATIVE NEGATIVE Final    Comment: (NOTE) The Xpert Xpress SARS-CoV-2/FLU/RSV plus assay is intended as an aid in the diagnosis of influenza from Nasopharyngeal swab specimens and should not be used as a sole basis for treatment. Nasal washings and aspirates are unacceptable for Xpert Xpress SARS-CoV-2/FLU/RSV testing.  Fact Sheet for Patients: BloggerCourse.com  Fact Sheet for Healthcare Providers: SeriousBroker.it  This test is not yet approved or cleared by the [place] FDA and has been authorized for detection and/or diagnosis of SARS-CoV-2 by FDA under an Emergency Use Authorization (EUA). This EUA will remain in effect (meaning this test can be used) for the duration of the COVID-19 declaration under Section 564(b)(1) of the Act, 21 U.S.C. section 360bbb-3(b)(1), unless the authorization is  terminated or revoked.  Performed at [facility], [address], [place], [place] [address]   Blood culture (routine x 2)     Status: None (Preliminary result)   Collection Time: 07/31/20 12:19 PM   Specimen: BLOOD RIGHT ARM  Result Value Ref Range Status   Specimen Description   Final    BLOOD RIGHT ARM Performed at [facility], [address], [place], [place] [address]    Special Requests   Final    BOTTLES DRAWN AEROBIC AND ANAEROBIC Blood Culture adequate volume Performed at [facility], [address] [place], [place] [address]    Culture   Final    NO GROWTH 2 DAYS Performed at [facility], [address], [place], [place] [address]    Report Status  PENDING  Incomplete  Urine culture     Status: None   Collection Time: 07/31/20  9:10 PM   Specimen: Urine, Random  Result Value Ref Range Status   Specimen Description   Final    URINE, RANDOM Performed at [facility], [address], [place], [place] [address]    Special Requests   Final    NONE Performed at [facility], [address] [place], [place] [address]    Culture   Final    NO GROWTH Performed at [facility], [address] [place], [place] [address]    Report Status 08/02/2020 FINAL  Final    Labs: BNP (last 3 results) No results for input(s): BNP in the last 8760 hours. Basic Metabolic Panel: Recent Labs  Lab 07/31/20 1204 08/01/20 0321 08/02/20 0306 08/03/20 0335  NA 136 137 135 139  K 3.6 4.0 3.5 5.0  CL 103 105 103 104  CO2 GLUCOSE 82 120* 115* 91  BUN CREATININE 0.66 0.51* 0.44* 0.46*  CALCIUM 9.0 9.0 8.6* 9.3  MG  --   --   --  2.2  PHOS  --   --   --  3.6   Liver Function Tests: Recent Labs  Lab 07/31/20 1204 08/01/20 0321 08/03/20 0335  AST ALT ALKPHOS 101 103 110  BILITOT 0.5 0.6 0.5  PROT 7.1 6.7 6.7  ALBUMIN 2.9* 2.6* 2.6*   Recent Labs  Lab 07/31/20 1204  LIPASE 25   No results for input(s): AMMONIA in the last 168 hours. CBC: Recent Labs  Lab 07/31/20 1204 08/01/20 0321 08/02/20 0306 08/03/20 0335  WBC 12.9* 10.9* 9.0 8.7  NEUTROABS 7.9*  --   --  4.3  HGB 12.1* 11.4* 11.0* 12.1*  HCT 38.3* 35.7* 34.2* 38.8*  MCV 80.3 80.4 80.3 81.7  PLT 507* 455* 432* 435*   Cardiac Enzymes: No results for input(s): CKTOTAL, CKMB, CKMBINDEX, TROPONINI in the last 168 hours. BNP: Invalid input(s): POCBNP CBG: No results for input(s): GLUCAP in the last 168 hours. D-Dimer No results for input(s): DDIMER in the last 72 hours. Hgb A1c No results for input(s): HGBA1C in the last 72 hours. Lipid Profile No results for input(s): CHOL,  HDL, LDLCALC, TRIG, CHOLHDL, LDLDIRECT in the last 72 hours. Thyroid function studies No results for input(s): TSH, T4TOTAL, T3FREE, THYROIDAB in the last 72 hours.  Invalid input(s): FREET3 Anemia work up No results for input(s): VITAMINB12, FOLATE, FERRITIN, TIBC, IRON, RETICCTPCT in the last 72 hours. Urinalysis    Component  Value Date/Time   COLORURINE YELLOW 07/31/2020 2110   APPEARANCEUR CLEAR 07/31/2020 2110   LABSPEC >1.046 (H) 07/31/2020 2110   PHURINE 5.0 07/31/2020 2110   GLUCOSEU NEGATIVE 07/31/2020 2110   HGBUR NEGATIVE 07/31/2020 2110   HGBUR negative 06/14/2010 1119   BILIRUBINUR NEGATIVE 07/31/2020 2110   BILIRUBINUR SMALL 01/04/2016 1634   KETONESUR NEGATIVE 07/31/2020 2110   PROTEINUR NEGATIVE 07/31/2020 2110   UROBILINOGEN 2.0 (H) 02/05/2018 1825   NITRITE NEGATIVE 07/31/2020 2110   LEUKOCYTESUR NEGATIVE 07/31/2020 2110   Sepsis Labs Invalid input(s): PROCALCITONIN,  WBC,  LACTICIDVEN Microbiology Recent Results (from the past 240 hour(s))  SARS Coronavirus 2 by RT PCR (hospital order, performed in [facility] hospital lab) Nasopharyngeal Nasopharyngeal Swab     Status: None   Collection Time: 07/27/20  8:24 AM   Specimen: Nasopharyngeal Swab  Result Value Ref Range Status   SARS Coronavirus 2 NEGATIVE NEGATIVE Final    Comment: (NOTE) SARS-CoV-2 target nucleic acids are NOT DETECTED.  The SARS-CoV-2 RNA is generally detectable in upper and lower respiratory specimens during the acute phase of infection. The lowest concentration of SARS-CoV-2 viral copies this assay can detect is 250 copies / mL. A negative result does not preclude SARS-CoV-2 infection and should not be used as the sole basis for treatment or other patient management decisions.  A negative result may occur with improper specimen collection / handling, submission of specimen other than nasopharyngeal swab, presence of viral mutation(s) within the areas targeted by this assay, and  inadequate number of viral copies (<250 copies / mL). A negative result must be combined with clinical observations, patient history, and epidemiological information.  Fact Sheet for Patients:   BoilerBrush.com.cy  Fact Sheet for Healthcare Providers: https://pope.com/  This test is not yet approved or  cleared by the [place] FDA and has been authorized for detection and/or diagnosis of SARS-CoV-2 by FDA under an Emergency Use Authorization (EUA).  This EUA will remain in effect (meaning this test can be used) for the duration of the COVID-19 declaration under Section 564(b)(1) of the Act, 21 U.S.C. section 360bbb-3(b)(1), unless the authorization is terminated or revoked sooner.  Performed at [facility], [address] [place], [place] [address]   Surgical pcr screen     Status: None   Collection Time: 07/27/20  9:23 AM   Specimen: Nasal Mucosa; Nasal Swab  Result Value Ref Range Status   MRSA, PCR NEGATIVE NEGATIVE Final   Staphylococcus aureus NEGATIVE NEGATIVE Final    Comment: (NOTE) The Xpert SA Assay (FDA approved for NASAL specimens in patients 67 years of age and older), is one component of a comprehensive surveillance program. It is not intended to diagnose infection nor to guide or monitor treatment. Performed at [facility], [address], [place], [place] [address]   Blood culture (routine x 2)     Status: None (Preliminary result)   Collection Time: 07/31/20 12:04 PM   Specimen: BLOOD  Result Value Ref Range Status   Specimen Description   Final    BLOOD LEFT ARM Performed at [facility], [address], [place], [place] [address]    Special Requests   Final    BOTTLES DRAWN AEROBIC AND ANAEROBIC Blood Culture adequate volume Performed at [facility], [address] [place], [place] [address]    Culture   Final    NO GROWTH 2 DAYS Performed at  [facility], [address], [place], [place] [address]    Report Status PENDING  Incomplete  Resp Panel by RT-PCR (Flu A&B, Covid) Nasopharyngeal Swab     Status: None   Collection Time: 07/31/20 12:04 PM   Specimen: Nasopharyngeal Swab; Nasopharyngeal(NP) swabs in vial transport medium  Result Value Ref Range Status   SARS Coronavirus 2 by RT PCR NEGATIVE NEGATIVE Final    Comment: (NOTE) SARS-CoV-2 target nucleic acids are NOT DETECTED.  The SARS-CoV-2 RNA is generally detectable in upper respiratory specimens during the acute phase of infection. The lowest concentration of SARS-CoV-2 viral copies this assay can detect is 138 copies/mL. A negative result does not preclude SARS-Cov-2 infection and should not be used as the sole basis for treatment or other patient management decisions. A negative result may occur with  improper specimen collection/handling, submission of specimen other than nasopharyngeal swab, presence of viral mutation(s) within the areas targeted by this assay, and inadequate number of viral copies(<138 copies/mL). A negative result must be combined with clinical observations, patient history, and epidemiological information. The expected result is Negative.  Fact Sheet for Patients:  BloggerCourse.com  Fact Sheet for Healthcare Providers:  SeriousBroker.it  This test is no t yet approved or cleared by the [place] FDA and  has been authorized for detection and/or diagnosis of SARS-CoV-2 by FDA under an Emergency Use Authorization (EUA). This EUA will remain  in effect (meaning this test can be used) for the duration of the COVID-19 declaration under Section 564(b)(1) of the Act, 21 U.S.C.section 360bbb-3(b)(1), unless the authorization is terminated  or revoked sooner.       Influenza A by PCR NEGATIVE NEGATIVE Final   Influenza B by PCR NEGATIVE NEGATIVE Final    Comment: (NOTE) The Xpert  Xpress SARS-CoV-2/FLU/RSV plus assay is intended as an aid in the diagnosis of influenza from Nasopharyngeal swab specimens and should not be used as a sole basis for treatment. Nasal washings and aspirates are unacceptable for Xpert Xpress SARS-CoV-2/FLU/RSV testing.  Fact Sheet for Patients: BloggerCourse.com  Fact Sheet for Healthcare Providers: SeriousBroker.it  This test is not yet approved or cleared by the [place] FDA and has been authorized for detection and/or diagnosis of SARS-CoV-2 by FDA under an Emergency Use Authorization (EUA). This EUA will remain in effect (meaning this test can be used) for the duration of the COVID-19 declaration under Section 564(b)(1) of the Act, 21 U.S.C. section 360bbb-3(b)(1), unless the authorization is terminated or revoked.  Performed at [facility], [address] [place], [place] [address]   Blood culture (routine x 2)     Status: None (Preliminary result)   Collection Time: 07/31/20 12:19 PM   Specimen: BLOOD RIGHT ARM  Result Value Ref Range Status   Specimen Description   Final    BLOOD RIGHT ARM Performed at [facility], [address], [place], [place] [address]    Special Requests   Final    BOTTLES DRAWN AEROBIC AND ANAEROBIC Blood Culture adequate volume Performed at [facility], [address], [place], [place] [address]    Culture   Final    NO GROWTH 2 DAYS Performed at [facility], [address] [place], [place] [address]    Report Status PENDING  Incomplete  Urine culture     Status: None   Collection Time: 07/31/20  9:10 PM   Specimen: Urine, Random  Result Value Ref Range Status   Specimen Description   Final  URINE, RANDOM Performed at [facility], [address] [place], [place] [address]    Special Requests   Final    NONE Performed at [facility], [address] [place], [place] [address]    Culture   Final    NO GROWTH Performed at [facility], [address], [place], [place] [address]    Report Status 08/02/2020 FINAL  Final   Time coordinating discharge: 35 minutes  SIGNED:  Merlene Laughter, DO [facility] 08/03/2020, 1:01 PM Pager is on AMION  If 7PM-7AM, please contact night-coverage [email or web address]

## 2020-08-03 NOTE — TOC Transition Note (Signed)
Transition of Care [facility]) - CM/SW Discharge Note   Patient Details  Name: [name] MRN: [id number] Date of Birth: 1960-09-22  Transition of [facility]) CM/SW Contact:  Amada Jupiter, LCSW Phone Number: 08/03/2020, 3:17 PM   Clinical Narrative:    Pt medically cleared for dc today and return to [facility].  Spoke with group home manager, [name], who is aware and agreeable with return - requests [facility] for transport.  [facility] called @ 3:15.  No further TOC needs.   Final next level of care: Group Home Barriers to Discharge: Barriers Resolved   Patient Goals and CMS Choice        Discharge Placement                Patient to be transferred to facility by: [facility] Name of family member notified: sister, Lynda Rainwater Patient and family notified of of transfer: 08/03/20  Discharge Plan and Services                DME Arranged: N/A DME Agency: NA                  Social Determinants of Health (SDOH) Interventions     Readmission Risk Interventions Readmission Risk Prevention Plan 08/03/2020  Post Dischage Appt Complete  Medication Screening Complete  Some recent data might be hidden

## 2020-08-05 LAB — CULTURE, BLOOD (ROUTINE X 2)
Culture: NO GROWTH
Culture: NO GROWTH
Special Requests: ADEQUATE
Special Requests: ADEQUATE

## 2020-08-11 ENCOUNTER — Other Ambulatory Visit: Payer: Self-pay

## 2020-08-11 ENCOUNTER — Encounter (HOSPITAL_BASED_OUTPATIENT_CLINIC_OR_DEPARTMENT_OTHER): Payer: Medicare Other | Attending: Internal Medicine | Admitting: Internal Medicine

## 2020-08-11 DIAGNOSIS — L97312 Non-pressure chronic ulcer of right ankle with fat layer exposed: Secondary | ICD-10-CM | POA: Insufficient documentation

## 2020-08-11 DIAGNOSIS — L97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 layer exposed: Secondary | ICD-10-CM | POA: Insufficient documentation

## 2020-08-11 DIAGNOSIS — S90811A Abrasion, right foot, initial encounter: Secondary | ICD-10-CM | POA: Insufficient documentation

## 2020-08-11 DIAGNOSIS — Y835 Amputation of limb(s) as the cause of abnormal reaction of the patient, or of later complication, without mention of misadventure at the time of the procedure: Secondary | ICD-10-CM | POA: Diagnosis not present

## 2020-08-11 DIAGNOSIS — L97522 Non-pressure chronic ulcer of other part of left foot with fat layer exposed: Secondary | ICD-10-CM | POA: Diagnosis not present

## 2020-08-11 DIAGNOSIS — Z8616 Personal history of COVID-19: Secondary | ICD-10-CM | POA: Insufficient documentation

## 2020-08-11 DIAGNOSIS — T8781 Dehiscence of amputation stump: Secondary | ICD-10-CM | POA: Insufficient documentation

## 2020-08-11 DIAGNOSIS — L97322 Non-pressure chronic ulcer of left ankle with fat layer exposed: Secondary | ICD-10-CM | POA: Diagnosis not present

## 2020-08-11 DIAGNOSIS — X58XXXA Exposure to other specified factors, initial encounter: Secondary | ICD-10-CM | POA: Insufficient documentation

## 2020-08-11 DIAGNOSIS — G809 Cerebral palsy, unspecified: Secondary | ICD-10-CM | POA: Diagnosis not present

## 2020-08-11 NOTE — Progress Notes (Signed)
on 08/11/2020 12:09:30 -------------------------------------------------------------------------------- Physical Exam Details Patient Name: Date of Service: [name], [name] 08/11/2020 10:45 A M Medical Record Number: [id number] Patient Account Number: [id number] Date of Birth/Sex: Treating RN: 1961/04/19 (60 y.o. [name] Primary Care Provider: [name] Other Clinician: Referring Provider: Treating Provider/Extender: [name], [name] in Treatment: 20 Constitutional Sitting or standing Blood Pressure is within target range for patient.. Pulse regular and within target range for patient.Marland Kitchen Respirations regular, non-labored and within target range.. Temperature is normal and within the target range for the patient.Marland Kitchen Appears in no distress. He does not appear to be systemically unwell. Cardiovascular Pedal pulses are palpable. Notes Wound exam Left medial foot and ankle wound is a lot worse. There is some erythema around this but no real tenderness. 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 It is wrapped in Ace wrap. I did not look at this today. Electronic Signature(s) Signed: 08/11/2020 5:26:06 PM By: Baltazar Najjar MD Entered By: Baltazar Najjar on 08/11/2020 12:13:35 -------------------------------------------------------------------------------- Physician Orders Details Patient Name: Date of Service: [name], [name] 08/11/2020 10:45 A M Medical Record Number: [id number] Patient Account Number: [id number] Date of Birth/Sex: Treating RN: 1961/03/23 (60 y.o. 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 Bathing/ Shower/ Hygiene May shower with protection but do not get wound dressing(s) wet. Other Bathing/Shower/Hygiene Orders/Instructions: - scrub legs gently with wash cloth and soap and water with dressing changes Edema Control - Lymphedema / SCD / Other Bilateral Lower Extremities Elevate legs to the level of the heart or above for 30 minutes daily and/or when sitting, a frequency of: Off-Loading Heel suspension boot to: - heel protectors at all times Other: - avoid any pressure to back of heels Home Health New wound care orders this week; continue Home Health for wound care. May utilize formulary equivalent dressing for wound treatment orders unless otherwise specified. - primary dressing changed-Please change dressing/wrap Mon and Fri and we will see in office Fri. Other Home Health Orders/Instructions: - [facility]: Wound Treatment Wound #47 - Malleolus Wound Laterality: Left, Medial Cleanser: Soap and Water (Home Health) 3 x Per Day/30 Days Discharge Instructions: May shower and wash wound with dial antibacterial soap and water prior to dressing change. Cleanser: Wound Cleanser (Home Health) 3 x Per Day/30 Days Discharge Instructions: Cleanse the wound with wound cleanser prior to applying a clean dressing using gauze  sponges, not tissue or cotton balls. Peri-Wound Care: Sween Lotion (Moisturizing lotion) (Home Health) 3 x Per Day/30 Days Discharge Instructions: Apply moisturizing lotion with dressing changes Prim Dressing: KerraCel Ag Gelling Fiber Dressing, 4x5 in (silver alginate) (Home Health) 3 x Per Day/30 Days ary Discharge Instructions: Apply silver alginate to wound bed as instructed Secondary Dressing: Woven Gauze Sponge, Non-Sterile 4x4 in 3 x Per Day/30 Days Discharge Instructions: Apply over primary dressing as directed. 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 There was a Three Layer Compression Therapy Procedure by Antonieta Iba, RN. Post procedure Diagnosis Wound #47: Same as Pre-Procedure Plan Follow-up Appointments: Return Appointment in 1 week. Bathing/  Shower/ Hygiene: May shower with protection but do not get wound dressing(s) wet. Other Bathing/Shower/Hygiene Orders/Instructions: - scrub legs gently with wash cloth and soap and water with dressing changes Edema Control - Lymphedema / SCD / Other: Elevate legs to the level of the heart or above for 30 minutes daily and/or when sitting, a frequency of: Off-Loading: Heel suspension boot to: - heel protectors at all times Other: - avoid any pressure to back of heels Home Health: New wound care orders this week; continue Home Health for wound care. May utilize formulary equivalent dressing for wound treatment orders unless otherwise specified. - primary dressing changed-Please change dressing/wrap Mon and Fri and we will see in office Fri. Other Home Health Orders/Instructions: - [facility]: WOUND #47: - Malleolus Wound Laterality: Left, Medial Cleanser: Soap and Water (Home Health) 3 x Per Day/30 Days Discharge Instructions: May shower and wash wound with dial antibacterial soap and water prior to dressing change. Cleanser: Wound Cleanser (Home Health) 3 x Per Day/30 Days Discharge Instructions: Cleanse the wound with wound cleanser prior to applying a clean dressing using gauze sponges, not tissue or cotton balls. Peri-Wound Care: Sween Lotion (Moisturizing lotion) (Home Health) 3 x Per Day/30 Days Discharge Instructions: Apply moisturizing lotion with dressing changes Prim Dressing: KerraCel Ag Gelling Fiber Dressing, 4x5 in (silver alginate) (Home Health) 3 x Per Day/30 Days ary Discharge Instructions: Apply silver alginate to wound bed as instructed Secondary Dressing: Woven Gauze Sponge, Non-Sterile 4x4 in 3 x Per Day/30 Days Discharge Instructions: Apply over primary dressing as directed. Secondary Dressing: ABD Pad, 8x10 (Home Health) 3 x Per Day/30 Days Discharge Instructions: Apply over primary dressing as directed. Com pression Wrap: ThreePress (3 layer compression wrap) (Home  Health) 3 x Per Day/30 Days Discharge Instructions: Apply three layer compression as directed. 1. We put silver alginate under the wound and reinitiated 3 layer compression 2. I agree with the thought of cellulitis Augmentin as ordered I did not change this. 3. He has home health checking this I asked him to come in and change this Monday and Wednesday and we will see it on Friday. If there is deterioration they are to call us 4. I found myself wondering about a deep tissue culture if this worsens Electronic Signature(s) Signed: 08/11/2020 5:26:06 PM By: Baltazar Najjar MD Entered By: Baltazar Najjar on 08/11/2020 12:12:21 -------------------------------------------------------------------------------- SuperBill Details Patient Name: Date of Service: [name] 08/11/2020 Medical Record Number: [id number] Patient Account Number: [id number] Date of Birth/Sex: Treating RN: 04/20/1961 (59 y.o. Lytle Michaels Primary Care Provider: [name] Other Clinician: Referring Provider: Treating Provider/Extender: [name], [name] in Treatment: 20 Diagnosis Coding ICD-10 Codes Code Description T81.31XD Disruption of external operation (surgical) wound, not elsewhere classified, subsequent encounter S90.811D Abrasion, right foot, subsequent encounter L97.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 layer exposed L97.312 Non-pressure chronic ulcer of right  Per Day/30 Days Discharge Instructions: Apply over primary dressing as directed. Compression Wrap: ThreePress (3 layer compression wrap) (Home Health) 3 x Per Day/30 Days Discharge Instructions: Apply three layer compression as directed. Electronic Signature(s) Signed: 08/11/2020 5:26:06 PM By: Baltazar Najjarobson, Loyda Costin MD Signed: 08/11/2020 5:48:33 PM By: Antonieta IbaBarnhart, Jodi Previous Signature: 08/11/2020 11:45:55 AM Version By: Antonieta IbaBarnhart, Jodi Entered By: Antonieta IbaBarnhart, Jodi on 08/11/2020 11:55:35 -------------------------------------------------------------------------------- Problem List Details Patient Name: Date of Service: [name] RhineHUMBLE, [name] 08/11/2020 10:45 A M Medical Record Number: [id number] Patient Account Number: [id number] Date of Birth/Sex: Treating RN: [birth year] (59 y.o. Lytle MichaelsM) Barnhart, Jodi Primary Care Provider: Jamison OkaBakare, Mobolaji Other Clinician: Referring Provider: Treating Provider/Extender: Alethia Bertholdobson, Marlen Koman Bakare, [name] in Treatment: 20 Active Problems ICD-10 Encounter Code Description Active Date MDM Diagnosis T81.31XD Disruption of external operation (surgical) wound, not elsewhere classified, 03/23/2020 No Yes subsequent encounter S90.811D Abrasion, right foot, subsequent encounter 03/23/2020 No Yes L97.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 layer exposed 03/23/2020 No  Yes L97.312 Non-pressure chronic ulcer of right ankle with fat layer exposed 03/23/2020 No Yes L97.322 Non-pressure chronic ulcer of left ankle with fat layer exposed 03/23/2020 No Yes L89.616 Pressure-induced deep tissue damage of right heel 05/05/2020 No Yes Inactive Problems ICD-10 Code Description Active Date Inactive Date I89.0 Lymphedema, not elsewhere classified 03/23/2020 03/23/2020 L03.115 Cellulitis of right lower limb 04/07/2020 04/07/2020 Resolved Problems Electronic Signature(s) Signed: 08/11/2020 11:45:27 AM By: Antonieta IbaBarnhart, Jodi Signed: 08/11/2020 5:26:06 PM By: Baltazar Najjarobson, Emiel Kielty MD Entered By: Antonieta IbaBarnhart, Jodi on 08/11/2020 11:45:27 -------------------------------------------------------------------------------- Progress Note Details Patient Name: Date of Service: [name] RhineHUMBLE, [name] 08/11/2020 10:45 A M Medical Record Number: [id number] Patient Account Number: [id number] Date of Birth/Sex: Treating RN: [birth year] (59 y.o. Damaris SchoonerM) Boehlein, Linda Primary Care Provider: Jamison OkaBakare, Mobolaji Other Clinician: Referring Provider: Treating Provider/Extender: Alethia Bertholdobson, Sherida Dobkins Bakare, [name] in Treatment: 20 Subjective History of Present Illness (HPI) 11/23/15; this is a patient I remember from a previous stay in this clinic in 2012. The patient says this was for a wound in this same area as currently although I have no information to verify that. Most of the information is provided by his caregiver from the group home where he resides. Apparently he has had a wound in this area for 8 months. He also has erythema around this area and has had multiple rounds of antibiotics apparently with some improvement but the erythema is persists. He apparently had a fracture in the ankle 2 months ago and was seen by Dr. Lajoyce Cornersuda . He apparently had a splint applied and more recently a heel offloading boot. He had home health coming out to a week ago not clear what they are dressing this with but they  apparently discharged him perhaps because of one of Dr. 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 He is not a diabetic. Looking through [facility] he appears to have fairly active primary care. The area on the ankle is variably labeled as a pressure area and/or chronic venous insufficiency. He had a recent venous ultrasound on 5/25 that did not show a DVT in the left leg however this was not a reflux study. There was no superficial DVT either. Arterial studies on 10/15/14 showed a left ABI of 1.16 Center right of 1.12 waveforms were triphasic he does not have significant arterial disease. He has had recent x-rays of the left foot and ankle. The  [name], [name] ([id number]) Visit Report for 08/11/2020 HPI Details Patient Name: Date of Service: [name], [name] 08/11/2020 10:45 A M Medical Record Number: [id number] Patient Account Number: [id number] Date of Birth/Sex: Treating RN: January 18, 1961 (60 y.o. [name] Primary Care Provider: [name] Other Clinician: Referring Provider: Treating Provider/Extender: [name], [name] in Treatment: 20 History of Present Illness HPI Description: 11/23/15; this is a patient I remember from a previous stay in this clinic in 2012. The patient says this was for a wound in this same area as currently although I have no information to verify that. Most of the information is provided by his caregiver from the group home where he resides. Apparently he has had a wound in this area for 8 months. He also has erythema around this area and has had multiple rounds of antibiotics apparently with some improvement but the erythema is persists. He apparently had a fracture in the ankle 2 months ago and was seen by Dr. Lajoyce Corners . He apparently had a splint applied and more recently a heel offloading boot. He had home health coming out to a week ago not clear what they are dressing this with but they apparently discharged him perhaps because of one of Dr. 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 He has a Nurse, adult for transferring at home he is nonambulatory and does not wear footwear. He is not a diabetic. Looking through [facility] he appears to have fairly active primary care. The area on the ankle is variably labeled as a pressure area and/or chronic venous insufficiency. He had a recent venous ultrasound on 5/25 that did not show a DVT in the left leg however this was not a reflux study. There was no superficial DVT either. Arterial studies on 10/15/14 showed a left ABI of 1.16 Center right of 1.12 waveforms were triphasic he does not  have significant arterial disease. He has had recent x-rays of the left foot and ankle. The left foot did not show any abnormality. Left ankle x-ray showed a spiral fracture of the left tibial diaphysis without evidence of osteomyelitis. 11/30/15 culture I did last week showed pseudomonas I changed him to oral ciprofloxacin. Erythema is a lot better. 12/07/15 area around the wound shows considerable improvement of the periwound erythema. 12/14/15; the patient has a improvement of the periwound erythema which in retrospect I think with cellulitis successfully treated. We have been using Iodoflex started last week 12/25/15 wound on the left medial malleolus and dorsal left foot. Theraskin #1 applied READMISSION 02/27/16; the patient was admitted to hospital from 8/14 through 01/18/16 initially felt to have cellulitis of his left lower leg however he was noted to have significant persistent pain and swelling. A duplex ultrasound was ordered that was negative. Obie Dredge an x-ray was done that showed a spiral fracture of the left leg. With posterior displacement and angulation at the mid left tibia. Orthopedics saw the patient and splinted the leg. He was then sent to [facility] skilled facility and only return to his group home last week. He has wounds on the left medial malleolus, left dorsal foot and a worrisome area on the plantar left heel which is not really open but may represent a hematoma or a DTI 03/06/16; areas of left anterior and medial ankle both look improved. There are 3 small wounds here. He has a area over the dorsal left first metatarsal head which is not open. In meantime his left  on 08/11/2020 12:09:30 -------------------------------------------------------------------------------- Physical Exam Details Patient Name: Date of Service: [name], [name] 08/11/2020 10:45 A M Medical Record Number: [id number] Patient Account Number: [id number] Date of Birth/Sex: Treating RN: 1961/04/19 (60 y.o. [name] Primary Care Provider: [name] Other Clinician: Referring Provider: Treating Provider/Extender: [name], [name] in Treatment: 20 Constitutional Sitting or standing Blood Pressure is within target range for patient.. Pulse regular and within target range for patient.Marland Kitchen Respirations regular, non-labored and within target range.. Temperature is normal and within the target range for the patient.Marland Kitchen Appears in no distress. He does not appear to be systemically unwell. Cardiovascular Pedal pulses are palpable. Notes Wound exam Left medial foot and ankle wound is a lot worse. There is some erythema around this but no real tenderness. 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 It is wrapped in Ace wrap. I did not look at this today. Electronic Signature(s) Signed: 08/11/2020 5:26:06 PM By: Baltazar Najjar MD Entered By: Baltazar Najjar on 08/11/2020 12:13:35 -------------------------------------------------------------------------------- Physician Orders Details Patient Name: Date of Service: [name], [name] 08/11/2020 10:45 A M Medical Record Number: [id number] Patient Account Number: [id number] Date of Birth/Sex: Treating RN: 1961/03/23 (60 y.o. 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 Bathing/ Shower/ Hygiene May shower with protection but do not get wound dressing(s) wet. Other Bathing/Shower/Hygiene Orders/Instructions: - scrub legs gently with wash cloth and soap and water with dressing changes Edema Control - Lymphedema / SCD / Other Bilateral Lower Extremities Elevate legs to the level of the heart or above for 30 minutes daily and/or when sitting, a frequency of: Off-Loading Heel suspension boot to: - heel protectors at all times Other: - avoid any pressure to back of heels Home Health New wound care orders this week; continue Home Health for wound care. May utilize formulary equivalent dressing for wound treatment orders unless otherwise specified. - primary dressing changed-Please change dressing/wrap Mon and Fri and we will see in office Fri. Other Home Health Orders/Instructions: - [facility]: Wound Treatment Wound #47 - Malleolus Wound Laterality: Left, Medial Cleanser: Soap and Water (Home Health) 3 x Per Day/30 Days Discharge Instructions: May shower and wash wound with dial antibacterial soap and water prior to dressing change. Cleanser: Wound Cleanser (Home Health) 3 x Per Day/30 Days Discharge Instructions: Cleanse the wound with wound cleanser prior to applying a clean dressing using gauze  sponges, not tissue or cotton balls. Peri-Wound Care: Sween Lotion (Moisturizing lotion) (Home Health) 3 x Per Day/30 Days Discharge Instructions: Apply moisturizing lotion with dressing changes Prim Dressing: KerraCel Ag Gelling Fiber Dressing, 4x5 in (silver alginate) (Home Health) 3 x Per Day/30 Days ary Discharge Instructions: Apply silver alginate to wound bed as instructed Secondary Dressing: Woven Gauze Sponge, Non-Sterile 4x4 in 3 x Per Day/30 Days Discharge Instructions: Apply over primary dressing as directed. 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 There was a Three Layer Compression Therapy Procedure by Antonieta Iba, RN. Post procedure Diagnosis Wound #47: Same as Pre-Procedure Plan Follow-up Appointments: Return Appointment in 1 week. Bathing/  Shower/ Hygiene: May shower with protection but do not get wound dressing(s) wet. Other Bathing/Shower/Hygiene Orders/Instructions: - scrub legs gently with wash cloth and soap and water with dressing changes Edema Control - Lymphedema / SCD / Other: Elevate legs to the level of the heart or above for 30 minutes daily and/or when sitting, a frequency of: Off-Loading: Heel suspension boot to: - heel protectors at all times Other: - avoid any pressure to back of heels Home Health: New wound care orders this week; continue Home Health for wound care. May utilize formulary equivalent dressing for wound treatment orders unless otherwise specified. - primary dressing changed-Please change dressing/wrap Mon and Fri and we will see in office Fri. Other Home Health Orders/Instructions: - [facility]: WOUND #47: - Malleolus Wound Laterality: Left, Medial Cleanser: Soap and Water (Home Health) 3 x Per Day/30 Days Discharge Instructions: May shower and wash wound with dial antibacterial soap and water prior to dressing change. Cleanser: Wound Cleanser (Home Health) 3 x Per Day/30 Days Discharge Instructions: Cleanse the wound with wound cleanser prior to applying a clean dressing using gauze sponges, not tissue or cotton balls. Peri-Wound Care: Sween Lotion (Moisturizing lotion) (Home Health) 3 x Per Day/30 Days Discharge Instructions: Apply moisturizing lotion with dressing changes Prim Dressing: KerraCel Ag Gelling Fiber Dressing, 4x5 in (silver alginate) (Home Health) 3 x Per Day/30 Days ary Discharge Instructions: Apply silver alginate to wound bed as instructed Secondary Dressing: Woven Gauze Sponge, Non-Sterile 4x4 in 3 x Per Day/30 Days Discharge Instructions: Apply over primary dressing as directed. Secondary Dressing: ABD Pad, 8x10 (Home Health) 3 x Per Day/30 Days Discharge Instructions: Apply over primary dressing as directed. Com pression Wrap: ThreePress (3 layer compression wrap) (Home  Health) 3 x Per Day/30 Days Discharge Instructions: Apply three layer compression as directed. 1. We put silver alginate under the wound and reinitiated 3 layer compression 2. I agree with the thought of cellulitis Augmentin as ordered I did not change this. 3. He has home health checking this I asked him to come in and change this Monday and Wednesday and we will see it on Friday. If there is deterioration they are to call us 4. I found myself wondering about a deep tissue culture if this worsens Electronic Signature(s) Signed: 08/11/2020 5:26:06 PM By: Baltazar Najjar MD Entered By: Baltazar Najjar on 08/11/2020 12:12:21 -------------------------------------------------------------------------------- SuperBill Details Patient Name: Date of Service: [name] 08/11/2020 Medical Record Number: [id number] Patient Account Number: [id number] Date of Birth/Sex: Treating RN: 04/20/1961 (59 y.o. Lytle Michaels Primary Care Provider: [name] Other Clinician: Referring Provider: Treating Provider/Extender: [name], [name] in Treatment: 20 Diagnosis Coding ICD-10 Codes Code Description T81.31XD Disruption of external operation (surgical) wound, not elsewhere classified, subsequent encounter S90.811D Abrasion, right foot, subsequent encounter L97.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 layer exposed L97.312 Non-pressure chronic ulcer of right  [name], [name] ([id number]) Visit Report for 08/11/2020 HPI Details Patient Name: Date of Service: [name], [name] 08/11/2020 10:45 A M Medical Record Number: [id number] Patient Account Number: [id number] Date of Birth/Sex: Treating RN: January 18, 1961 (60 y.o. [name] Primary Care Provider: [name] Other Clinician: Referring Provider: Treating Provider/Extender: [name], [name] in Treatment: 20 History of Present Illness HPI Description: 11/23/15; this is a patient I remember from a previous stay in this clinic in 2012. The patient says this was for a wound in this same area as currently although I have no information to verify that. Most of the information is provided by his caregiver from the group home where he resides. Apparently he has had a wound in this area for 8 months. He also has erythema around this area and has had multiple rounds of antibiotics apparently with some improvement but the erythema is persists. He apparently had a fracture in the ankle 2 months ago and was seen by Dr. Lajoyce Corners . He apparently had a splint applied and more recently a heel offloading boot. He had home health coming out to a week ago not clear what they are dressing this with but they apparently discharged him perhaps because of one of Dr. 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 He has a Nurse, adult for transferring at home he is nonambulatory and does not wear footwear. He is not a diabetic. Looking through [facility] he appears to have fairly active primary care. The area on the ankle is variably labeled as a pressure area and/or chronic venous insufficiency. He had a recent venous ultrasound on 5/25 that did not show a DVT in the left leg however this was not a reflux study. There was no superficial DVT either. Arterial studies on 10/15/14 showed a left ABI of 1.16 Center right of 1.12 waveforms were triphasic he does not  have significant arterial disease. He has had recent x-rays of the left foot and ankle. The left foot did not show any abnormality. Left ankle x-ray showed a spiral fracture of the left tibial diaphysis without evidence of osteomyelitis. 11/30/15 culture I did last week showed pseudomonas I changed him to oral ciprofloxacin. Erythema is a lot better. 12/07/15 area around the wound shows considerable improvement of the periwound erythema. 12/14/15; the patient has a improvement of the periwound erythema which in retrospect I think with cellulitis successfully treated. We have been using Iodoflex started last week 12/25/15 wound on the left medial malleolus and dorsal left foot. Theraskin #1 applied READMISSION 02/27/16; the patient was admitted to hospital from 8/14 through 01/18/16 initially felt to have cellulitis of his left lower leg however he was noted to have significant persistent pain and swelling. A duplex ultrasound was ordered that was negative. Obie Dredge an x-ray was done that showed a spiral fracture of the left leg. With posterior displacement and angulation at the mid left tibia. Orthopedics saw the patient and splinted the leg. He was then sent to [facility] skilled facility and only return to his group home last week. He has wounds on the left medial malleolus, left dorsal foot and a worrisome area on the plantar left heel which is not really open but may represent a hematoma or a DTI 03/06/16; areas of left anterior and medial ankle both look improved. There are 3 small wounds here. He has a area over the dorsal left first metatarsal head which is not open. In meantime his left  Per Day/30 Days Discharge Instructions: Apply over primary dressing as directed. Compression Wrap: ThreePress (3 layer compression wrap) (Home Health) 3 x Per Day/30 Days Discharge Instructions: Apply three layer compression as directed. Electronic Signature(s) Signed: 08/11/2020 5:26:06 PM By: Baltazar Najjarobson, Loyda Costin MD Signed: 08/11/2020 5:48:33 PM By: Antonieta IbaBarnhart, Jodi Previous Signature: 08/11/2020 11:45:55 AM Version By: Antonieta IbaBarnhart, Jodi Entered By: Antonieta IbaBarnhart, Jodi on 08/11/2020 11:55:35 -------------------------------------------------------------------------------- Problem List Details Patient Name: Date of Service: [name] RhineHUMBLE, [name] 08/11/2020 10:45 A M Medical Record Number: [id number] Patient Account Number: [id number] Date of Birth/Sex: Treating RN: [birth year] (59 y.o. Lytle MichaelsM) Barnhart, Jodi Primary Care Provider: Jamison OkaBakare, Mobolaji Other Clinician: Referring Provider: Treating Provider/Extender: Alethia Bertholdobson, Marlen Koman Bakare, [name] in Treatment: 20 Active Problems ICD-10 Encounter Code Description Active Date MDM Diagnosis T81.31XD Disruption of external operation (surgical) wound, not elsewhere classified, 03/23/2020 No Yes subsequent encounter S90.811D Abrasion, right foot, subsequent encounter 03/23/2020 No Yes L97.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 layer exposed 03/23/2020 No  Yes L97.312 Non-pressure chronic ulcer of right ankle with fat layer exposed 03/23/2020 No Yes L97.322 Non-pressure chronic ulcer of left ankle with fat layer exposed 03/23/2020 No Yes L89.616 Pressure-induced deep tissue damage of right heel 05/05/2020 No Yes Inactive Problems ICD-10 Code Description Active Date Inactive Date I89.0 Lymphedema, not elsewhere classified 03/23/2020 03/23/2020 L03.115 Cellulitis of right lower limb 04/07/2020 04/07/2020 Resolved Problems Electronic Signature(s) Signed: 08/11/2020 11:45:27 AM By: Antonieta IbaBarnhart, Jodi Signed: 08/11/2020 5:26:06 PM By: Baltazar Najjarobson, Emiel Kielty MD Entered By: Antonieta IbaBarnhart, Jodi on 08/11/2020 11:45:27 -------------------------------------------------------------------------------- Progress Note Details Patient Name: Date of Service: [name] RhineHUMBLE, [name] 08/11/2020 10:45 A M Medical Record Number: [id number] Patient Account Number: [id number] Date of Birth/Sex: Treating RN: [birth year] (59 y.o. Damaris SchoonerM) Boehlein, Linda Primary Care Provider: Jamison OkaBakare, Mobolaji Other Clinician: Referring Provider: Treating Provider/Extender: Alethia Bertholdobson, Sherida Dobkins Bakare, [name] in Treatment: 20 Subjective History of Present Illness (HPI) 11/23/15; this is a patient I remember from a previous stay in this clinic in 2012. The patient says this was for a wound in this same area as currently although I have no information to verify that. Most of the information is provided by his caregiver from the group home where he resides. Apparently he has had a wound in this area for 8 months. He also has erythema around this area and has had multiple rounds of antibiotics apparently with some improvement but the erythema is persists. He apparently had a fracture in the ankle 2 months ago and was seen by Dr. Lajoyce Cornersuda . He apparently had a splint applied and more recently a heel offloading boot. He had home health coming out to a week ago not clear what they are dressing this with but they  apparently discharged him perhaps because of one of Dr. 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 He is not a diabetic. Looking through [facility] he appears to have fairly active primary care. The area on the ankle is variably labeled as a pressure area and/or chronic venous insufficiency. He had a recent venous ultrasound on 5/25 that did not show a DVT in the left leg however this was not a reflux study. There was no superficial DVT either. Arterial studies on 10/15/14 showed a left ABI of 1.16 Center right of 1.12 waveforms were triphasic he does not have significant arterial disease. He has had recent x-rays of the left foot and ankle. 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 There was a Three Layer Compression Therapy Procedure by Antonieta Iba, RN. Post procedure Diagnosis Wound #47: Same as Pre-Procedure Plan Follow-up Appointments: Return Appointment in 1 week. Bathing/  Shower/ Hygiene: May shower with protection but do not get wound dressing(s) wet. Other Bathing/Shower/Hygiene Orders/Instructions: - scrub legs gently with wash cloth and soap and water with dressing changes Edema Control - Lymphedema / SCD / Other: Elevate legs to the level of the heart or above for 30 minutes daily and/or when sitting, a frequency of: Off-Loading: Heel suspension boot to: - heel protectors at all times Other: - avoid any pressure to back of heels Home Health: New wound care orders this week; continue Home Health for wound care. May utilize formulary equivalent dressing for wound treatment orders unless otherwise specified. - primary dressing changed-Please change dressing/wrap Mon and Fri and we will see in office Fri. Other Home Health Orders/Instructions: - [facility]: WOUND #47: - Malleolus Wound Laterality: Left, Medial Cleanser: Soap and Water (Home Health) 3 x Per Day/30 Days Discharge Instructions: May shower and wash wound with dial antibacterial soap and water prior to dressing change. Cleanser: Wound Cleanser (Home Health) 3 x Per Day/30 Days Discharge Instructions: Cleanse the wound with wound cleanser prior to applying a clean dressing using gauze sponges, not tissue or cotton balls. Peri-Wound Care: Sween Lotion (Moisturizing lotion) (Home Health) 3 x Per Day/30 Days Discharge Instructions: Apply moisturizing lotion with dressing changes Prim Dressing: KerraCel Ag Gelling Fiber Dressing, 4x5 in (silver alginate) (Home Health) 3 x Per Day/30 Days ary Discharge Instructions: Apply silver alginate to wound bed as instructed Secondary Dressing: Woven Gauze Sponge, Non-Sterile 4x4 in 3 x Per Day/30 Days Discharge Instructions: Apply over primary dressing as directed. Secondary Dressing: ABD Pad, 8x10 (Home Health) 3 x Per Day/30 Days Discharge Instructions: Apply over primary dressing as directed. Com pression Wrap: ThreePress (3 layer compression wrap) (Home  Health) 3 x Per Day/30 Days Discharge Instructions: Apply three layer compression as directed. 1. We put silver alginate under the wound and reinitiated 3 layer compression 2. I agree with the thought of cellulitis Augmentin as ordered I did not change this. 3. He has home health checking this I asked him to come in and change this Monday and Wednesday and we will see it on Friday. If there is deterioration they are to call us 4. I found myself wondering about a deep tissue culture if this worsens Electronic Signature(s) Signed: 08/11/2020 5:26:06 PM By: Baltazar Najjar MD Entered By: Baltazar Najjar on 08/11/2020 12:12:21 -------------------------------------------------------------------------------- SuperBill Details Patient Name: Date of Service: [name] 08/11/2020 Medical Record Number: [id number] Patient Account Number: [id number] Date of Birth/Sex: Treating RN: 04/20/1961 (59 y.o. Lytle Michaels Primary Care Provider: [name] Other Clinician: Referring Provider: Treating Provider/Extender: [name], [name] in Treatment: 20 Diagnosis Coding ICD-10 Codes Code Description T81.31XD Disruption of external operation (surgical) wound, not elsewhere classified, subsequent encounter S90.811D Abrasion, right foot, subsequent encounter L97.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 layer exposed L97.312 Non-pressure chronic ulcer of right  Per Day/30 Days Discharge Instructions: Apply over primary dressing as directed. Compression Wrap: ThreePress (3 layer compression wrap) (Home Health) 3 x Per Day/30 Days Discharge Instructions: Apply three layer compression as directed. Electronic Signature(s) Signed: 08/11/2020 5:26:06 PM By: Baltazar Najjarobson, Loyda Costin MD Signed: 08/11/2020 5:48:33 PM By: Antonieta IbaBarnhart, Jodi Previous Signature: 08/11/2020 11:45:55 AM Version By: Antonieta IbaBarnhart, Jodi Entered By: Antonieta IbaBarnhart, Jodi on 08/11/2020 11:55:35 -------------------------------------------------------------------------------- Problem List Details Patient Name: Date of Service: [name] RhineHUMBLE, [name] 08/11/2020 10:45 A M Medical Record Number: [id number] Patient Account Number: [id number] Date of Birth/Sex: Treating RN: [birth year] (59 y.o. Lytle MichaelsM) Barnhart, Jodi Primary Care Provider: Jamison OkaBakare, Mobolaji Other Clinician: Referring Provider: Treating Provider/Extender: Alethia Bertholdobson, Marlen Koman Bakare, [name] in Treatment: 20 Active Problems ICD-10 Encounter Code Description Active Date MDM Diagnosis T81.31XD Disruption of external operation (surgical) wound, not elsewhere classified, 03/23/2020 No Yes subsequent encounter S90.811D Abrasion, right foot, subsequent encounter 03/23/2020 No Yes L97.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 layer exposed 03/23/2020 No  Yes L97.312 Non-pressure chronic ulcer of right ankle with fat layer exposed 03/23/2020 No Yes L97.322 Non-pressure chronic ulcer of left ankle with fat layer exposed 03/23/2020 No Yes L89.616 Pressure-induced deep tissue damage of right heel 05/05/2020 No Yes Inactive Problems ICD-10 Code Description Active Date Inactive Date I89.0 Lymphedema, not elsewhere classified 03/23/2020 03/23/2020 L03.115 Cellulitis of right lower limb 04/07/2020 04/07/2020 Resolved Problems Electronic Signature(s) Signed: 08/11/2020 11:45:27 AM By: Antonieta IbaBarnhart, Jodi Signed: 08/11/2020 5:26:06 PM By: Baltazar Najjarobson, Emiel Kielty MD Entered By: Antonieta IbaBarnhart, Jodi on 08/11/2020 11:45:27 -------------------------------------------------------------------------------- Progress Note Details Patient Name: Date of Service: [name] RhineHUMBLE, [name] 08/11/2020 10:45 A M Medical Record Number: [id number] Patient Account Number: [id number] Date of Birth/Sex: Treating RN: [birth year] (59 y.o. Damaris SchoonerM) Boehlein, Linda Primary Care Provider: Jamison OkaBakare, Mobolaji Other Clinician: Referring Provider: Treating Provider/Extender: Alethia Bertholdobson, Sherida Dobkins Bakare, [name] in Treatment: 20 Subjective History of Present Illness (HPI) 11/23/15; this is a patient I remember from a previous stay in this clinic in 2012. The patient says this was for a wound in this same area as currently although I have no information to verify that. Most of the information is provided by his caregiver from the group home where he resides. Apparently he has had a wound in this area for 8 months. He also has erythema around this area and has had multiple rounds of antibiotics apparently with some improvement but the erythema is persists. He apparently had a fracture in the ankle 2 months ago and was seen by Dr. Lajoyce Cornersuda . He apparently had a splint applied and more recently a heel offloading boot. He had home health coming out to a week ago not clear what they are dressing this with but they  apparently discharged him perhaps because of one of Dr. 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 He is not a diabetic. Looking through [facility] he appears to have fairly active primary care. The area on the ankle is variably labeled as a pressure area and/or chronic venous insufficiency. He had a recent venous ultrasound on 5/25 that did not show a DVT in the left leg however this was not a reflux study. There was no superficial DVT either. Arterial studies on 10/15/14 showed a left ABI of 1.16 Center right of 1.12 waveforms were triphasic he does not have significant arterial disease. He has had recent x-rays of the left foot and ankle. The  Per Day/30 Days Discharge Instructions: Apply over primary dressing as directed. Compression Wrap: ThreePress (3 layer compression wrap) (Home Health) 3 x Per Day/30 Days Discharge Instructions: Apply three layer compression as directed. Electronic Signature(s) Signed: 08/11/2020 5:26:06 PM By: Baltazar Najjarobson, Loyda Costin MD Signed: 08/11/2020 5:48:33 PM By: Antonieta IbaBarnhart, Jodi Previous Signature: 08/11/2020 11:45:55 AM Version By: Antonieta IbaBarnhart, Jodi Entered By: Antonieta IbaBarnhart, Jodi on 08/11/2020 11:55:35 -------------------------------------------------------------------------------- Problem List Details Patient Name: Date of Service: [name] RhineHUMBLE, [name] 08/11/2020 10:45 A M Medical Record Number: [id number] Patient Account Number: [id number] Date of Birth/Sex: Treating RN: [birth year] (59 y.o. Lytle MichaelsM) Barnhart, Jodi Primary Care Provider: Jamison OkaBakare, Mobolaji Other Clinician: Referring Provider: Treating Provider/Extender: Alethia Bertholdobson, Marlen Koman Bakare, [name] in Treatment: 20 Active Problems ICD-10 Encounter Code Description Active Date MDM Diagnosis T81.31XD Disruption of external operation (surgical) wound, not elsewhere classified, 03/23/2020 No Yes subsequent encounter S90.811D Abrasion, right foot, subsequent encounter 03/23/2020 No Yes L97.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 layer exposed 03/23/2020 No  Yes L97.312 Non-pressure chronic ulcer of right ankle with fat layer exposed 03/23/2020 No Yes L97.322 Non-pressure chronic ulcer of left ankle with fat layer exposed 03/23/2020 No Yes L89.616 Pressure-induced deep tissue damage of right heel 05/05/2020 No Yes Inactive Problems ICD-10 Code Description Active Date Inactive Date I89.0 Lymphedema, not elsewhere classified 03/23/2020 03/23/2020 L03.115 Cellulitis of right lower limb 04/07/2020 04/07/2020 Resolved Problems Electronic Signature(s) Signed: 08/11/2020 11:45:27 AM By: Antonieta IbaBarnhart, Jodi Signed: 08/11/2020 5:26:06 PM By: Baltazar Najjarobson, Emiel Kielty MD Entered By: Antonieta IbaBarnhart, Jodi on 08/11/2020 11:45:27 -------------------------------------------------------------------------------- Progress Note Details Patient Name: Date of Service: [name] RhineHUMBLE, [name] 08/11/2020 10:45 A M Medical Record Number: [id number] Patient Account Number: [id number] Date of Birth/Sex: Treating RN: [birth year] (59 y.o. Damaris SchoonerM) Boehlein, Linda Primary Care Provider: Jamison OkaBakare, Mobolaji Other Clinician: Referring Provider: Treating Provider/Extender: Alethia Bertholdobson, Sherida Dobkins Bakare, [name] in Treatment: 20 Subjective History of Present Illness (HPI) 11/23/15; this is a patient I remember from a previous stay in this clinic in 2012. The patient says this was for a wound in this same area as currently although I have no information to verify that. Most of the information is provided by his caregiver from the group home where he resides. Apparently he has had a wound in this area for 8 months. He also has erythema around this area and has had multiple rounds of antibiotics apparently with some improvement but the erythema is persists. He apparently had a fracture in the ankle 2 months ago and was seen by Dr. Lajoyce Cornersuda . He apparently had a splint applied and more recently a heel offloading boot. He had home health coming out to a week ago not clear what they are dressing this with but they  apparently discharged him perhaps because of one of Dr. 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 He is not a diabetic. Looking through [facility] he appears to have fairly active primary care. The area on the ankle is variably labeled as a pressure area and/or chronic venous insufficiency. He had a recent venous ultrasound on 5/25 that did not show a DVT in the left leg however this was not a reflux study. There was no superficial DVT either. Arterial studies on 10/15/14 showed a left ABI of 1.16 Center right of 1.12 waveforms were triphasic he does not have significant arterial disease. He has had recent x-rays of the left foot and ankle. The  on 08/11/2020 12:09:30 -------------------------------------------------------------------------------- Physical Exam Details Patient Name: Date of Service: [name], [name] 08/11/2020 10:45 A M Medical Record Number: [id number] Patient Account Number: [id number] Date of Birth/Sex: Treating RN: 1961/04/19 (60 y.o. [name] Primary Care Provider: [name] Other Clinician: Referring Provider: Treating Provider/Extender: [name], [name] in Treatment: 20 Constitutional Sitting or standing Blood Pressure is within target range for patient.. Pulse regular and within target range for patient.Marland Kitchen Respirations regular, non-labored and within target range.. Temperature is normal and within the target range for the patient.Marland Kitchen Appears in no distress. He does not appear to be systemically unwell. Cardiovascular Pedal pulses are palpable. Notes Wound exam Left medial foot and ankle wound is a lot worse. There is some erythema around this but no real tenderness. 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 It is wrapped in Ace wrap. I did not look at this today. Electronic Signature(s) Signed: 08/11/2020 5:26:06 PM By: Baltazar Najjar MD Entered By: Baltazar Najjar on 08/11/2020 12:13:35 -------------------------------------------------------------------------------- Physician Orders Details Patient Name: Date of Service: [name], [name] 08/11/2020 10:45 A M Medical Record Number: [id number] Patient Account Number: [id number] Date of Birth/Sex: Treating RN: 1961/03/23 (60 y.o. 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 Bathing/ Shower/ Hygiene May shower with protection but do not get wound dressing(s) wet. Other Bathing/Shower/Hygiene Orders/Instructions: - scrub legs gently with wash cloth and soap and water with dressing changes Edema Control - Lymphedema / SCD / Other Bilateral Lower Extremities Elevate legs to the level of the heart or above for 30 minutes daily and/or when sitting, a frequency of: Off-Loading Heel suspension boot to: - heel protectors at all times Other: - avoid any pressure to back of heels Home Health New wound care orders this week; continue Home Health for wound care. May utilize formulary equivalent dressing for wound treatment orders unless otherwise specified. - primary dressing changed-Please change dressing/wrap Mon and Fri and we will see in office Fri. Other Home Health Orders/Instructions: - [facility]: Wound Treatment Wound #47 - Malleolus Wound Laterality: Left, Medial Cleanser: Soap and Water (Home Health) 3 x Per Day/30 Days Discharge Instructions: May shower and wash wound with dial antibacterial soap and water prior to dressing change. Cleanser: Wound Cleanser (Home Health) 3 x Per Day/30 Days Discharge Instructions: Cleanse the wound with wound cleanser prior to applying a clean dressing using gauze  sponges, not tissue or cotton balls. Peri-Wound Care: Sween Lotion (Moisturizing lotion) (Home Health) 3 x Per Day/30 Days Discharge Instructions: Apply moisturizing lotion with dressing changes Prim Dressing: KerraCel Ag Gelling Fiber Dressing, 4x5 in (silver alginate) (Home Health) 3 x Per Day/30 Days ary Discharge Instructions: Apply silver alginate to wound bed as instructed Secondary Dressing: Woven Gauze Sponge, Non-Sterile 4x4 in 3 x Per Day/30 Days Discharge Instructions: Apply over primary dressing as directed. 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 There was a Three Layer Compression Therapy Procedure by Antonieta Iba, RN. Post procedure Diagnosis Wound #47: Same as Pre-Procedure Plan Follow-up Appointments: Return Appointment in 1 week. Bathing/  Shower/ Hygiene: May shower with protection but do not get wound dressing(s) wet. Other Bathing/Shower/Hygiene Orders/Instructions: - scrub legs gently with wash cloth and soap and water with dressing changes Edema Control - Lymphedema / SCD / Other: Elevate legs to the level of the heart or above for 30 minutes daily and/or when sitting, a frequency of: Off-Loading: Heel suspension boot to: - heel protectors at all times Other: - avoid any pressure to back of heels Home Health: New wound care orders this week; continue Home Health for wound care. May utilize formulary equivalent dressing for wound treatment orders unless otherwise specified. - primary dressing changed-Please change dressing/wrap Mon and Fri and we will see in office Fri. Other Home Health Orders/Instructions: - [facility]: WOUND #47: - Malleolus Wound Laterality: Left, Medial Cleanser: Soap and Water (Home Health) 3 x Per Day/30 Days Discharge Instructions: May shower and wash wound with dial antibacterial soap and water prior to dressing change. Cleanser: Wound Cleanser (Home Health) 3 x Per Day/30 Days Discharge Instructions: Cleanse the wound with wound cleanser prior to applying a clean dressing using gauze sponges, not tissue or cotton balls. Peri-Wound Care: Sween Lotion (Moisturizing lotion) (Home Health) 3 x Per Day/30 Days Discharge Instructions: Apply moisturizing lotion with dressing changes Prim Dressing: KerraCel Ag Gelling Fiber Dressing, 4x5 in (silver alginate) (Home Health) 3 x Per Day/30 Days ary Discharge Instructions: Apply silver alginate to wound bed as instructed Secondary Dressing: Woven Gauze Sponge, Non-Sterile 4x4 in 3 x Per Day/30 Days Discharge Instructions: Apply over primary dressing as directed. Secondary Dressing: ABD Pad, 8x10 (Home Health) 3 x Per Day/30 Days Discharge Instructions: Apply over primary dressing as directed. Com pression Wrap: ThreePress (3 layer compression wrap) (Home  Health) 3 x Per Day/30 Days Discharge Instructions: Apply three layer compression as directed. 1. We put silver alginate under the wound and reinitiated 3 layer compression 2. I agree with the thought of cellulitis Augmentin as ordered I did not change this. 3. He has home health checking this I asked him to come in and change this Monday and Wednesday and we will see it on Friday. If there is deterioration they are to call us 4. I found myself wondering about a deep tissue culture if this worsens Electronic Signature(s) Signed: 08/11/2020 5:26:06 PM By: Baltazar Najjar MD Entered By: Baltazar Najjar on 08/11/2020 12:12:21 -------------------------------------------------------------------------------- SuperBill Details Patient Name: Date of Service: [name] 08/11/2020 Medical Record Number: [id number] Patient Account Number: [id number] Date of Birth/Sex: Treating RN: 04/20/1961 (59 y.o. Lytle Michaels Primary Care Provider: [name] Other Clinician: Referring Provider: Treating Provider/Extender: [name], [name] in Treatment: 20 Diagnosis Coding ICD-10 Codes Code Description T81.31XD Disruption of external operation (surgical) wound, not elsewhere classified, subsequent encounter S90.811D Abrasion, right foot, subsequent encounter L97.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 layer exposed L97.312 Non-pressure chronic ulcer of right  Per Day/30 Days Discharge Instructions: Apply over primary dressing as directed. Compression Wrap: ThreePress (3 layer compression wrap) (Home Health) 3 x Per Day/30 Days Discharge Instructions: Apply three layer compression as directed. Electronic Signature(s) Signed: 08/11/2020 5:26:06 PM By: Baltazar Najjarobson, Loyda Costin MD Signed: 08/11/2020 5:48:33 PM By: Antonieta IbaBarnhart, Jodi Previous Signature: 08/11/2020 11:45:55 AM Version By: Antonieta IbaBarnhart, Jodi Entered By: Antonieta IbaBarnhart, Jodi on 08/11/2020 11:55:35 -------------------------------------------------------------------------------- Problem List Details Patient Name: Date of Service: [name] RhineHUMBLE, [name] 08/11/2020 10:45 A M Medical Record Number: [id number] Patient Account Number: [id number] Date of Birth/Sex: Treating RN: [birth year] (59 y.o. Lytle MichaelsM) Barnhart, Jodi Primary Care Provider: Jamison OkaBakare, Mobolaji Other Clinician: Referring Provider: Treating Provider/Extender: Alethia Bertholdobson, Marlen Koman Bakare, [name] in Treatment: 20 Active Problems ICD-10 Encounter Code Description Active Date MDM Diagnosis T81.31XD Disruption of external operation (surgical) wound, not elsewhere classified, 03/23/2020 No Yes subsequent encounter S90.811D Abrasion, right foot, subsequent encounter 03/23/2020 No Yes L97.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 layer exposed 03/23/2020 No  Yes L97.312 Non-pressure chronic ulcer of right ankle with fat layer exposed 03/23/2020 No Yes L97.322 Non-pressure chronic ulcer of left ankle with fat layer exposed 03/23/2020 No Yes L89.616 Pressure-induced deep tissue damage of right heel 05/05/2020 No Yes Inactive Problems ICD-10 Code Description Active Date Inactive Date I89.0 Lymphedema, not elsewhere classified 03/23/2020 03/23/2020 L03.115 Cellulitis of right lower limb 04/07/2020 04/07/2020 Resolved Problems Electronic Signature(s) Signed: 08/11/2020 11:45:27 AM By: Antonieta IbaBarnhart, Jodi Signed: 08/11/2020 5:26:06 PM By: Baltazar Najjarobson, Emiel Kielty MD Entered By: Antonieta IbaBarnhart, Jodi on 08/11/2020 11:45:27 -------------------------------------------------------------------------------- Progress Note Details Patient Name: Date of Service: [name] RhineHUMBLE, [name] 08/11/2020 10:45 A M Medical Record Number: [id number] Patient Account Number: [id number] Date of Birth/Sex: Treating RN: [birth year] (59 y.o. Damaris SchoonerM) Boehlein, Linda Primary Care Provider: Jamison OkaBakare, Mobolaji Other Clinician: Referring Provider: Treating Provider/Extender: Alethia Bertholdobson, Sherida Dobkins Bakare, [name] in Treatment: 20 Subjective History of Present Illness (HPI) 11/23/15; this is a patient I remember from a previous stay in this clinic in 2012. The patient says this was for a wound in this same area as currently although I have no information to verify that. Most of the information is provided by his caregiver from the group home where he resides. Apparently he has had a wound in this area for 8 months. He also has erythema around this area and has had multiple rounds of antibiotics apparently with some improvement but the erythema is persists. He apparently had a fracture in the ankle 2 months ago and was seen by Dr. Lajoyce Cornersuda . He apparently had a splint applied and more recently a heel offloading boot. He had home health coming out to a week ago not clear what they are dressing this with but they  apparently discharged him perhaps because of one of Dr. 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 He is not a diabetic. Looking through [facility] he appears to have fairly active primary care. The area on the ankle is variably labeled as a pressure area and/or chronic venous insufficiency. He had a recent venous ultrasound on 5/25 that did not show a DVT in the left leg however this was not a reflux study. There was no superficial DVT either. Arterial studies on 10/15/14 showed a left ABI of 1.16 Center right of 1.12 waveforms were triphasic he does not have significant arterial disease. He has had recent x-rays of the left foot and ankle. The  [name], [name] ([id number]) Visit Report for 08/11/2020 HPI Details Patient Name: Date of Service: [name], [name] 08/11/2020 10:45 A M Medical Record Number: [id number] Patient Account Number: [id number] Date of Birth/Sex: Treating RN: January 18, 1961 (60 y.o. [name] Primary Care Provider: [name] Other Clinician: Referring Provider: Treating Provider/Extender: [name], [name] in Treatment: 20 History of Present Illness HPI Description: 11/23/15; this is a patient I remember from a previous stay in this clinic in 2012. The patient says this was for a wound in this same area as currently although I have no information to verify that. Most of the information is provided by his caregiver from the group home where he resides. Apparently he has had a wound in this area for 8 months. He also has erythema around this area and has had multiple rounds of antibiotics apparently with some improvement but the erythema is persists. He apparently had a fracture in the ankle 2 months ago and was seen by Dr. Lajoyce Corners . He apparently had a splint applied and more recently a heel offloading boot. He had home health coming out to a week ago not clear what they are dressing this with but they apparently discharged him perhaps because of one of Dr. 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 He has a Nurse, adult for transferring at home he is nonambulatory and does not wear footwear. He is not a diabetic. Looking through [facility] he appears to have fairly active primary care. The area on the ankle is variably labeled as a pressure area and/or chronic venous insufficiency. He had a recent venous ultrasound on 5/25 that did not show a DVT in the left leg however this was not a reflux study. There was no superficial DVT either. Arterial studies on 10/15/14 showed a left ABI of 1.16 Center right of 1.12 waveforms were triphasic he does not  have significant arterial disease. He has had recent x-rays of the left foot and ankle. The left foot did not show any abnormality. Left ankle x-ray showed a spiral fracture of the left tibial diaphysis without evidence of osteomyelitis. 11/30/15 culture I did last week showed pseudomonas I changed him to oral ciprofloxacin. Erythema is a lot better. 12/07/15 area around the wound shows considerable improvement of the periwound erythema. 12/14/15; the patient has a improvement of the periwound erythema which in retrospect I think with cellulitis successfully treated. We have been using Iodoflex started last week 12/25/15 wound on the left medial malleolus and dorsal left foot. Theraskin #1 applied READMISSION 02/27/16; the patient was admitted to hospital from 8/14 through 01/18/16 initially felt to have cellulitis of his left lower leg however he was noted to have significant persistent pain and swelling. A duplex ultrasound was ordered that was negative. Obie Dredge an x-ray was done that showed a spiral fracture of the left leg. With posterior displacement and angulation at the mid left tibia. Orthopedics saw the patient and splinted the leg. He was then sent to [facility] skilled facility and only return to his group home last week. He has wounds on the left medial malleolus, left dorsal foot and a worrisome area on the plantar left heel which is not really open but may represent a hematoma or a DTI 03/06/16; areas of left anterior and medial ankle both look improved. There are 3 small wounds here. He has a area over the dorsal left first metatarsal head which is not open. In meantime his left  [name], [name] ([id number]) Visit Report for 08/11/2020 HPI Details Patient Name: Date of Service: [name], [name] 08/11/2020 10:45 A M Medical Record Number: [id number] Patient Account Number: [id number] Date of Birth/Sex: Treating RN: January 18, 1961 (60 y.o. [name] Primary Care Provider: [name] Other Clinician: Referring Provider: Treating Provider/Extender: [name], [name] in Treatment: 20 History of Present Illness HPI Description: 11/23/15; this is a patient I remember from a previous stay in this clinic in 2012. The patient says this was for a wound in this same area as currently although I have no information to verify that. Most of the information is provided by his caregiver from the group home where he resides. Apparently he has had a wound in this area for 8 months. He also has erythema around this area and has had multiple rounds of antibiotics apparently with some improvement but the erythema is persists. He apparently had a fracture in the ankle 2 months ago and was seen by Dr. Lajoyce Corners . He apparently had a splint applied and more recently a heel offloading boot. He had home health coming out to a week ago not clear what they are dressing this with but they apparently discharged him perhaps because of one of Dr. 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 He has a Nurse, adult for transferring at home he is nonambulatory and does not wear footwear. He is not a diabetic. Looking through [facility] he appears to have fairly active primary care. The area on the ankle is variably labeled as a pressure area and/or chronic venous insufficiency. He had a recent venous ultrasound on 5/25 that did not show a DVT in the left leg however this was not a reflux study. There was no superficial DVT either. Arterial studies on 10/15/14 showed a left ABI of 1.16 Center right of 1.12 waveforms were triphasic he does not  have significant arterial disease. He has had recent x-rays of the left foot and ankle. The left foot did not show any abnormality. Left ankle x-ray showed a spiral fracture of the left tibial diaphysis without evidence of osteomyelitis. 11/30/15 culture I did last week showed pseudomonas I changed him to oral ciprofloxacin. Erythema is a lot better. 12/07/15 area around the wound shows considerable improvement of the periwound erythema. 12/14/15; the patient has a improvement of the periwound erythema which in retrospect I think with cellulitis successfully treated. We have been using Iodoflex started last week 12/25/15 wound on the left medial malleolus and dorsal left foot. Theraskin #1 applied READMISSION 02/27/16; the patient was admitted to hospital from 8/14 through 01/18/16 initially felt to have cellulitis of his left lower leg however he was noted to have significant persistent pain and swelling. A duplex ultrasound was ordered that was negative. Obie Dredge an x-ray was done that showed a spiral fracture of the left leg. With posterior displacement and angulation at the mid left tibia. Orthopedics saw the patient and splinted the leg. He was then sent to [facility] skilled facility and only return to his group home last week. He has wounds on the left medial malleolus, left dorsal foot and a worrisome area on the plantar left heel which is not really open but may represent a hematoma or a DTI 03/06/16; areas of left anterior and medial ankle both look improved. There are 3 small wounds here. He has a area over the dorsal left first metatarsal head which is not open. 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 There was a Three Layer Compression Therapy Procedure by Antonieta Iba, RN. Post procedure Diagnosis Wound #47: Same as Pre-Procedure Plan Follow-up Appointments: Return Appointment in 1 week. Bathing/  Shower/ Hygiene: May shower with protection but do not get wound dressing(s) wet. Other Bathing/Shower/Hygiene Orders/Instructions: - scrub legs gently with wash cloth and soap and water with dressing changes Edema Control - Lymphedema / SCD / Other: Elevate legs to the level of the heart or above for 30 minutes daily and/or when sitting, a frequency of: Off-Loading: Heel suspension boot to: - heel protectors at all times Other: - avoid any pressure to back of heels Home Health: New wound care orders this week; continue Home Health for wound care. May utilize formulary equivalent dressing for wound treatment orders unless otherwise specified. - primary dressing changed-Please change dressing/wrap Mon and Fri and we will see in office Fri. Other Home Health Orders/Instructions: - [facility]: WOUND #47: - Malleolus Wound Laterality: Left, Medial Cleanser: Soap and Water (Home Health) 3 x Per Day/30 Days Discharge Instructions: May shower and wash wound with dial antibacterial soap and water prior to dressing change. Cleanser: Wound Cleanser (Home Health) 3 x Per Day/30 Days Discharge Instructions: Cleanse the wound with wound cleanser prior to applying a clean dressing using gauze sponges, not tissue or cotton balls. Peri-Wound Care: Sween Lotion (Moisturizing lotion) (Home Health) 3 x Per Day/30 Days Discharge Instructions: Apply moisturizing lotion with dressing changes Prim Dressing: KerraCel Ag Gelling Fiber Dressing, 4x5 in (silver alginate) (Home Health) 3 x Per Day/30 Days ary Discharge Instructions: Apply silver alginate to wound bed as instructed Secondary Dressing: Woven Gauze Sponge, Non-Sterile 4x4 in 3 x Per Day/30 Days Discharge Instructions: Apply over primary dressing as directed. Secondary Dressing: ABD Pad, 8x10 (Home Health) 3 x Per Day/30 Days Discharge Instructions: Apply over primary dressing as directed. Com pression Wrap: ThreePress (3 layer compression wrap) (Home  Health) 3 x Per Day/30 Days Discharge Instructions: Apply three layer compression as directed. 1. We put silver alginate under the wound and reinitiated 3 layer compression 2. I agree with the thought of cellulitis Augmentin as ordered I did not change this. 3. He has home health checking this I asked him to come in and change this Monday and Wednesday and we will see it on Friday. If there is deterioration they are to call us 4. I found myself wondering about a deep tissue culture if this worsens Electronic Signature(s) Signed: 08/11/2020 5:26:06 PM By: Baltazar Najjar MD Entered By: Baltazar Najjar on 08/11/2020 12:12:21 -------------------------------------------------------------------------------- SuperBill Details Patient Name: Date of Service: [name] 08/11/2020 Medical Record Number: [id number] Patient Account Number: [id number] Date of Birth/Sex: Treating RN: 04/20/1961 (59 y.o. Lytle Michaels Primary Care Provider: [name] Other Clinician: Referring Provider: Treating Provider/Extender: [name], [name] in Treatment: 20 Diagnosis Coding ICD-10 Codes Code Description T81.31XD Disruption of external operation (surgical) wound, not elsewhere classified, subsequent encounter S90.811D Abrasion, right foot, subsequent encounter L97.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 layer exposed L97.312 Non-pressure chronic ulcer of right  external operation (surgical) wound, not elsewhere classified, subsequent encounter Abrasion, right foot, subsequent encounter 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 There was a Three Layer Compression Therapy Procedure by Antonieta Iba, RN. Post procedure Diagnosis Wound #47: Same as Pre-Procedure Plan Follow-up Appointments: Return Appointment in 1 week. Bathing/  Shower/ Hygiene: May shower with protection but do not get wound dressing(s) wet. Other Bathing/Shower/Hygiene Orders/Instructions: - scrub legs gently with wash cloth and soap and water with dressing changes Edema Control - Lymphedema / SCD / Other: Elevate legs to the level of the heart or above for 30 minutes daily and/or when sitting, a frequency of: Off-Loading: Heel suspension boot to: - heel protectors at all times Other: - avoid any pressure to back of heels Home Health: New wound care orders this week; continue Home Health for wound care. May utilize formulary equivalent dressing for wound treatment orders unless otherwise specified. - primary dressing changed-Please change dressing/wrap Mon and Fri and we will see in office Fri. Other Home Health Orders/Instructions: - [facility]: WOUND #47: - Malleolus Wound Laterality: Left, Medial Cleanser: Soap and Water (Home Health) 3 x Per Day/30 Days Discharge Instructions: May shower and wash wound with dial antibacterial soap and water prior to dressing change. Cleanser: Wound Cleanser (Home Health) 3 x Per Day/30 Days Discharge Instructions: Cleanse the wound with wound cleanser prior to applying a clean dressing using gauze sponges, not tissue or cotton balls. Peri-Wound Care: Sween Lotion (Moisturizing lotion) (Home Health) 3 x Per Day/30 Days Discharge Instructions: Apply moisturizing lotion with dressing changes Prim Dressing: KerraCel Ag Gelling Fiber Dressing, 4x5 in (silver alginate) (Home Health) 3 x Per Day/30 Days ary Discharge Instructions: Apply silver alginate to wound bed as instructed Secondary Dressing: Woven Gauze Sponge, Non-Sterile 4x4 in 3 x Per Day/30 Days Discharge Instructions: Apply over primary dressing as directed. Secondary Dressing: ABD Pad, 8x10 (Home Health) 3 x Per Day/30 Days Discharge Instructions: Apply over primary dressing as directed. Com pression Wrap: ThreePress (3 layer compression wrap) (Home  Health) 3 x Per Day/30 Days Discharge Instructions: Apply three layer compression as directed. 1. We put silver alginate under the wound and reinitiated 3 layer compression 2. I agree with the thought of cellulitis Augmentin as ordered I did not change this. 3. He has home health checking this I asked him to come in and change this Monday and Wednesday and we will see it on Friday. If there is deterioration they are to call us 4. I found myself wondering about a deep tissue culture if this worsens Electronic Signature(s) Signed: 08/11/2020 5:26:06 PM By: Baltazar Najjar MD Entered By: Baltazar Najjar on 08/11/2020 12:12:21 -------------------------------------------------------------------------------- SuperBill Details Patient Name: Date of Service: [name] 08/11/2020 Medical Record Number: [id number] Patient Account Number: [id number] Date of Birth/Sex: Treating RN: 04/20/1961 (59 y.o. Lytle Michaels Primary Care Provider: [name] Other Clinician: Referring Provider: Treating Provider/Extender: [name], [name] in Treatment: 20 Diagnosis Coding ICD-10 Codes Code Description T81.31XD Disruption of external operation (surgical) wound, not elsewhere classified, subsequent encounter S90.811D Abrasion, right foot, subsequent encounter L97.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 layer exposed L97.312 Non-pressure chronic ulcer of right  [name], [name] ([id number]) Visit Report for 08/11/2020 HPI Details Patient Name: Date of Service: [name], [name] 08/11/2020 10:45 A M Medical Record Number: [id number] Patient Account Number: [id number] Date of Birth/Sex: Treating RN: January 18, 1961 (60 y.o. [name] Primary Care Provider: [name] Other Clinician: Referring Provider: Treating Provider/Extender: [name], [name] in Treatment: 20 History of Present Illness HPI Description: 11/23/15; this is a patient I remember from a previous stay in this clinic in 2012. The patient says this was for a wound in this same area as currently although I have no information to verify that. Most of the information is provided by his caregiver from the group home where he resides. Apparently he has had a wound in this area for 8 months. He also has erythema around this area and has had multiple rounds of antibiotics apparently with some improvement but the erythema is persists. He apparently had a fracture in the ankle 2 months ago and was seen by Dr. Lajoyce Corners . He apparently had a splint applied and more recently a heel offloading boot. He had home health coming out to a week ago not clear what they are dressing this with but they apparently discharged him perhaps because of one of Dr. 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 He has a Nurse, adult for transferring at home he is nonambulatory and does not wear footwear. He is not a diabetic. Looking through [facility] he appears to have fairly active primary care. The area on the ankle is variably labeled as a pressure area and/or chronic venous insufficiency. He had a recent venous ultrasound on 5/25 that did not show a DVT in the left leg however this was not a reflux study. There was no superficial DVT either. Arterial studies on 10/15/14 showed a left ABI of 1.16 Center right of 1.12 waveforms were triphasic he does not  have significant arterial disease. He has had recent x-rays of the left foot and ankle. The left foot did not show any abnormality. Left ankle x-ray showed a spiral fracture of the left tibial diaphysis without evidence of osteomyelitis. 11/30/15 culture I did last week showed pseudomonas I changed him to oral ciprofloxacin. Erythema is a lot better. 12/07/15 area around the wound shows considerable improvement of the periwound erythema. 12/14/15; the patient has a improvement of the periwound erythema which in retrospect I think with cellulitis successfully treated. We have been using Iodoflex started last week 12/25/15 wound on the left medial malleolus and dorsal left foot. Theraskin #1 applied READMISSION 02/27/16; the patient was admitted to hospital from 8/14 through 01/18/16 initially felt to have cellulitis of his left lower leg however he was noted to have significant persistent pain and swelling. A duplex ultrasound was ordered that was negative. Obie Dredge an x-ray was done that showed a spiral fracture of the left leg. With posterior displacement and angulation at the mid left tibia. Orthopedics saw the patient and splinted the leg. He was then sent to [facility] skilled facility and only return to his group home last week. He has wounds on the left medial malleolus, left dorsal foot and a worrisome area on the plantar left heel which is not really open but may represent a hematoma or a DTI 03/06/16; areas of left anterior and medial ankle both look improved. There are 3 small wounds here. He has a area over the dorsal left first metatarsal head which is not open. 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 There was a Three Layer Compression Therapy Procedure by Antonieta Iba, RN. Post procedure Diagnosis Wound #47: Same as Pre-Procedure Plan Follow-up Appointments: Return Appointment in 1 week. Bathing/  Shower/ Hygiene: May shower with protection but do not get wound dressing(s) wet. Other Bathing/Shower/Hygiene Orders/Instructions: - scrub legs gently with wash cloth and soap and water with dressing changes Edema Control - Lymphedema / SCD / Other: Elevate legs to the level of the heart or above for 30 minutes daily and/or when sitting, a frequency of: Off-Loading: Heel suspension boot to: - heel protectors at all times Other: - avoid any pressure to back of heels Home Health: New wound care orders this week; continue Home Health for wound care. May utilize formulary equivalent dressing for wound treatment orders unless otherwise specified. - primary dressing changed-Please change dressing/wrap Mon and Fri and we will see in office Fri. Other Home Health Orders/Instructions: - [facility]: WOUND #47: - Malleolus Wound Laterality: Left, Medial Cleanser: Soap and Water (Home Health) 3 x Per Day/30 Days Discharge Instructions: May shower and wash wound with dial antibacterial soap and water prior to dressing change. Cleanser: Wound Cleanser (Home Health) 3 x Per Day/30 Days Discharge Instructions: Cleanse the wound with wound cleanser prior to applying a clean dressing using gauze sponges, not tissue or cotton balls. Peri-Wound Care: Sween Lotion (Moisturizing lotion) (Home Health) 3 x Per Day/30 Days Discharge Instructions: Apply moisturizing lotion with dressing changes Prim Dressing: KerraCel Ag Gelling Fiber Dressing, 4x5 in (silver alginate) (Home Health) 3 x Per Day/30 Days ary Discharge Instructions: Apply silver alginate to wound bed as instructed Secondary Dressing: Woven Gauze Sponge, Non-Sterile 4x4 in 3 x Per Day/30 Days Discharge Instructions: Apply over primary dressing as directed. Secondary Dressing: ABD Pad, 8x10 (Home Health) 3 x Per Day/30 Days Discharge Instructions: Apply over primary dressing as directed. Com pression Wrap: ThreePress (3 layer compression wrap) (Home  Health) 3 x Per Day/30 Days Discharge Instructions: Apply three layer compression as directed. 1. We put silver alginate under the wound and reinitiated 3 layer compression 2. I agree with the thought of cellulitis Augmentin as ordered I did not change this. 3. He has home health checking this I asked him to come in and change this Monday and Wednesday and we will see it on Friday. If there is deterioration they are to call us 4. I found myself wondering about a deep tissue culture if this worsens Electronic Signature(s) Signed: 08/11/2020 5:26:06 PM By: Baltazar Najjar MD Entered By: Baltazar Najjar on 08/11/2020 12:12:21 -------------------------------------------------------------------------------- SuperBill Details Patient Name: Date of Service: [name] 08/11/2020 Medical Record Number: [id number] Patient Account Number: [id number] Date of Birth/Sex: Treating RN: 04/20/1961 (59 y.o. Lytle Michaels Primary Care Provider: [name] Other Clinician: Referring Provider: Treating Provider/Extender: [name], [name] in Treatment: 20 Diagnosis Coding ICD-10 Codes Code Description T81.31XD Disruption of external operation (surgical) wound, not elsewhere classified, subsequent encounter S90.811D Abrasion, right foot, subsequent encounter L97.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 layer exposed L97.312 Non-pressure chronic ulcer of right  external operation (surgical) wound, not elsewhere classified, subsequent encounter Abrasion, right foot, subsequent encounter 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 There was a Three Layer Compression Therapy Procedure by Antonieta Iba, RN. Post procedure Diagnosis Wound #47: Same as Pre-Procedure Plan Follow-up Appointments: Return Appointment in 1 week. Bathing/  Shower/ Hygiene: May shower with protection but do not get wound dressing(s) wet. Other Bathing/Shower/Hygiene Orders/Instructions: - scrub legs gently with wash cloth and soap and water with dressing changes Edema Control - Lymphedema / SCD / Other: Elevate legs to the level of the heart or above for 30 minutes daily and/or when sitting, a frequency of: Off-Loading: Heel suspension boot to: - heel protectors at all times Other: - avoid any pressure to back of heels Home Health: New wound care orders this week; continue Home Health for wound care. May utilize formulary equivalent dressing for wound treatment orders unless otherwise specified. - primary dressing changed-Please change dressing/wrap Mon and Fri and we will see in office Fri. Other Home Health Orders/Instructions: - [facility]: WOUND #47: - Malleolus Wound Laterality: Left, Medial Cleanser: Soap and Water (Home Health) 3 x Per Day/30 Days Discharge Instructions: May shower and wash wound with dial antibacterial soap and water prior to dressing change. Cleanser: Wound Cleanser (Home Health) 3 x Per Day/30 Days Discharge Instructions: Cleanse the wound with wound cleanser prior to applying a clean dressing using gauze sponges, not tissue or cotton balls. Peri-Wound Care: Sween Lotion (Moisturizing lotion) (Home Health) 3 x Per Day/30 Days Discharge Instructions: Apply moisturizing lotion with dressing changes Prim Dressing: KerraCel Ag Gelling Fiber Dressing, 4x5 in (silver alginate) (Home Health) 3 x Per Day/30 Days ary Discharge Instructions: Apply silver alginate to wound bed as instructed Secondary Dressing: Woven Gauze Sponge, Non-Sterile 4x4 in 3 x Per Day/30 Days Discharge Instructions: Apply over primary dressing as directed. Secondary Dressing: ABD Pad, 8x10 (Home Health) 3 x Per Day/30 Days Discharge Instructions: Apply over primary dressing as directed. Com pression Wrap: ThreePress (3 layer compression wrap) (Home  Health) 3 x Per Day/30 Days Discharge Instructions: Apply three layer compression as directed. 1. We put silver alginate under the wound and reinitiated 3 layer compression 2. I agree with the thought of cellulitis Augmentin as ordered I did not change this. 3. He has home health checking this I asked him to come in and change this Monday and Wednesday and we will see it on Friday. If there is deterioration they are to call us 4. I found myself wondering about a deep tissue culture if this worsens Electronic Signature(s) Signed: 08/11/2020 5:26:06 PM By: Baltazar Najjar MD Entered By: Baltazar Najjar on 08/11/2020 12:12:21 -------------------------------------------------------------------------------- SuperBill Details Patient Name: Date of Service: [name] 08/11/2020 Medical Record Number: [id number] Patient Account Number: [id number] Date of Birth/Sex: Treating RN: 04/20/1961 (59 y.o. Lytle Michaels Primary Care Provider: [name] Other Clinician: Referring Provider: Treating Provider/Extender: [name], [name] in Treatment: 20 Diagnosis Coding ICD-10 Codes Code Description T81.31XD Disruption of external operation (surgical) wound, not elsewhere classified, subsequent encounter S90.811D Abrasion, right foot, subsequent encounter L97.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 layer exposed L97.312 Non-pressure chronic ulcer of right  on 08/11/2020 12:09:30 -------------------------------------------------------------------------------- Physical Exam Details Patient Name: Date of Service: [name], [name] 08/11/2020 10:45 A M Medical Record Number: [id number] Patient Account Number: [id number] Date of Birth/Sex: Treating RN: 1961/04/19 (60 y.o. [name] Primary Care Provider: [name] Other Clinician: Referring Provider: Treating Provider/Extender: [name], [name] in Treatment: 20 Constitutional Sitting or standing Blood Pressure is within target range for patient.. Pulse regular and within target range for patient.Marland Kitchen Respirations regular, non-labored and within target range.. Temperature is normal and within the target range for the patient.Marland Kitchen Appears in no distress. He does not appear to be systemically unwell. Cardiovascular Pedal pulses are palpable. Notes Wound exam Left medial foot and ankle wound is a lot worse. There is some erythema around this but no real tenderness. 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 It is wrapped in Ace wrap. I did not look at this today. Electronic Signature(s) Signed: 08/11/2020 5:26:06 PM By: Baltazar Najjar MD Entered By: Baltazar Najjar on 08/11/2020 12:13:35 -------------------------------------------------------------------------------- Physician Orders Details Patient Name: Date of Service: [name], [name] 08/11/2020 10:45 A M Medical Record Number: [id number] Patient Account Number: [id number] Date of Birth/Sex: Treating RN: 1961/03/23 (60 y.o. 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 Bathing/ Shower/ Hygiene May shower with protection but do not get wound dressing(s) wet. Other Bathing/Shower/Hygiene Orders/Instructions: - scrub legs gently with wash cloth and soap and water with dressing changes Edema Control - Lymphedema / SCD / Other Bilateral Lower Extremities Elevate legs to the level of the heart or above for 30 minutes daily and/or when sitting, a frequency of: Off-Loading Heel suspension boot to: - heel protectors at all times Other: - avoid any pressure to back of heels Home Health New wound care orders this week; continue Home Health for wound care. May utilize formulary equivalent dressing for wound treatment orders unless otherwise specified. - primary dressing changed-Please change dressing/wrap Mon and Fri and we will see in office Fri. Other Home Health Orders/Instructions: - [facility]: Wound Treatment Wound #47 - Malleolus Wound Laterality: Left, Medial Cleanser: Soap and Water (Home Health) 3 x Per Day/30 Days Discharge Instructions: May shower and wash wound with dial antibacterial soap and water prior to dressing change. Cleanser: Wound Cleanser (Home Health) 3 x Per Day/30 Days Discharge Instructions: Cleanse the wound with wound cleanser prior to applying a clean dressing using gauze  sponges, not tissue or cotton balls. Peri-Wound Care: Sween Lotion (Moisturizing lotion) (Home Health) 3 x Per Day/30 Days Discharge Instructions: Apply moisturizing lotion with dressing changes Prim Dressing: KerraCel Ag Gelling Fiber Dressing, 4x5 in (silver alginate) (Home Health) 3 x Per Day/30 Days ary Discharge Instructions: Apply silver alginate to wound bed as instructed Secondary Dressing: Woven Gauze Sponge, Non-Sterile 4x4 in 3 x Per Day/30 Days Discharge Instructions: Apply over primary dressing as directed. Secondary Dressing: ABD Pad, 8x10 (Home Health) 3 x  Per Day/30 Days Discharge Instructions: Apply over primary dressing as directed. Compression Wrap: ThreePress (3 layer compression wrap) (Home Health) 3 x Per Day/30 Days Discharge Instructions: Apply three layer compression as directed. Electronic Signature(s) Signed: 08/11/2020 5:26:06 PM By: Baltazar Najjarobson, Loyda Costin MD Signed: 08/11/2020 5:48:33 PM By: Antonieta IbaBarnhart, Jodi Previous Signature: 08/11/2020 11:45:55 AM Version By: Antonieta IbaBarnhart, Jodi Entered By: Antonieta IbaBarnhart, Jodi on 08/11/2020 11:55:35 -------------------------------------------------------------------------------- Problem List Details Patient Name: Date of Service: [name] RhineHUMBLE, [name] 08/11/2020 10:45 A M Medical Record Number: [id number] Patient Account Number: [id number] Date of Birth/Sex: Treating RN: [birth year] (59 y.o. Lytle MichaelsM) Barnhart, Jodi Primary Care Provider: Jamison OkaBakare, Mobolaji Other Clinician: Referring Provider: Treating Provider/Extender: Alethia Bertholdobson, Marlen Koman Bakare, [name] in Treatment: 20 Active Problems ICD-10 Encounter Code Description Active Date MDM Diagnosis T81.31XD Disruption of external operation (surgical) wound, not elsewhere classified, 03/23/2020 No Yes subsequent encounter S90.811D Abrasion, right foot, subsequent encounter 03/23/2020 No Yes L97.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 layer exposed 03/23/2020 No  Yes L97.312 Non-pressure chronic ulcer of right ankle with fat layer exposed 03/23/2020 No Yes L97.322 Non-pressure chronic ulcer of left ankle with fat layer exposed 03/23/2020 No Yes L89.616 Pressure-induced deep tissue damage of right heel 05/05/2020 No Yes Inactive Problems ICD-10 Code Description Active Date Inactive Date I89.0 Lymphedema, not elsewhere classified 03/23/2020 03/23/2020 L03.115 Cellulitis of right lower limb 04/07/2020 04/07/2020 Resolved Problems Electronic Signature(s) Signed: 08/11/2020 11:45:27 AM By: Antonieta IbaBarnhart, Jodi Signed: 08/11/2020 5:26:06 PM By: Baltazar Najjarobson, Emiel Kielty MD Entered By: Antonieta IbaBarnhart, Jodi on 08/11/2020 11:45:27 -------------------------------------------------------------------------------- Progress Note Details Patient Name: Date of Service: [name] RhineHUMBLE, [name] 08/11/2020 10:45 A M Medical Record Number: [id number] Patient Account Number: [id number] Date of Birth/Sex: Treating RN: [birth year] (59 y.o. Damaris SchoonerM) Boehlein, Linda Primary Care Provider: Jamison OkaBakare, Mobolaji Other Clinician: Referring Provider: Treating Provider/Extender: Alethia Bertholdobson, Sherida Dobkins Bakare, [name] in Treatment: 20 Subjective History of Present Illness (HPI) 11/23/15; this is a patient I remember from a previous stay in this clinic in 2012. The patient says this was for a wound in this same area as currently although I have no information to verify that. Most of the information is provided by his caregiver from the group home where he resides. Apparently he has had a wound in this area for 8 months. He also has erythema around this area and has had multiple rounds of antibiotics apparently with some improvement but the erythema is persists. He apparently had a fracture in the ankle 2 months ago and was seen by Dr. Lajoyce Cornersuda . He apparently had a splint applied and more recently a heel offloading boot. He had home health coming out to a week ago not clear what they are dressing this with but they  apparently discharged him perhaps because of one of Dr. 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 He is not a diabetic. Looking through [facility] he appears to have fairly active primary care. The area on the ankle is variably labeled as a pressure area and/or chronic venous insufficiency. He had a recent venous ultrasound on 5/25 that did not show a DVT in the left leg however this was not a reflux study. There was no superficial DVT either. Arterial studies on 10/15/14 showed a left ABI of 1.16 Center right of 1.12 waveforms were triphasic he does not have significant arterial disease. He has had recent x-rays of the left foot and ankle. The  [name], [name] ([id number]) Visit Report for 08/11/2020 HPI Details Patient Name: Date of Service: [name], [name] 08/11/2020 10:45 A M Medical Record Number: [id number] Patient Account Number: [id number] Date of Birth/Sex: Treating RN: January 18, 1961 (60 y.o. [name] Primary Care Provider: [name] Other Clinician: Referring Provider: Treating Provider/Extender: [name], [name] in Treatment: 20 History of Present Illness HPI Description: 11/23/15; this is a patient I remember from a previous stay in this clinic in 2012. The patient says this was for a wound in this same area as currently although I have no information to verify that. Most of the information is provided by his caregiver from the group home where he resides. Apparently he has had a wound in this area for 8 months. He also has erythema around this area and has had multiple rounds of antibiotics apparently with some improvement but the erythema is persists. He apparently had a fracture in the ankle 2 months ago and was seen by Dr. Lajoyce Corners . He apparently had a splint applied and more recently a heel offloading boot. He had home health coming out to a week ago not clear what they are dressing this with but they apparently discharged him perhaps because of one of Dr. 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 He has a Nurse, adult for transferring at home he is nonambulatory and does not wear footwear. He is not a diabetic. Looking through [facility] he appears to have fairly active primary care. The area on the ankle is variably labeled as a pressure area and/or chronic venous insufficiency. He had a recent venous ultrasound on 5/25 that did not show a DVT in the left leg however this was not a reflux study. There was no superficial DVT either. Arterial studies on 10/15/14 showed a left ABI of 1.16 Center right of 1.12 waveforms were triphasic he does not  have significant arterial disease. He has had recent x-rays of the left foot and ankle. The left foot did not show any abnormality. Left ankle x-ray showed a spiral fracture of the left tibial diaphysis without evidence of osteomyelitis. 11/30/15 culture I did last week showed pseudomonas I changed him to oral ciprofloxacin. Erythema is a lot better. 12/07/15 area around the wound shows considerable improvement of the periwound erythema. 12/14/15; the patient has a improvement of the periwound erythema which in retrospect I think with cellulitis successfully treated. We have been using Iodoflex started last week 12/25/15 wound on the left medial malleolus and dorsal left foot. Theraskin #1 applied READMISSION 02/27/16; the patient was admitted to hospital from 8/14 through 01/18/16 initially felt to have cellulitis of his left lower leg however he was noted to have significant persistent pain and swelling. A duplex ultrasound was ordered that was negative. Obie Dredge an x-ray was done that showed a spiral fracture of the left leg. With posterior displacement and angulation at the mid left tibia. Orthopedics saw the patient and splinted the leg. He was then sent to [facility] skilled facility and only return to his group home last week. He has wounds on the left medial malleolus, left dorsal foot and a worrisome area on the plantar left heel which is not really open but may represent a hematoma or a DTI 03/06/16; areas of left anterior and medial ankle both look improved. There are 3 small wounds here. He has a area over the dorsal left first metatarsal head which is not open. In meantime his left  on 08/11/2020 12:09:30 -------------------------------------------------------------------------------- Physical Exam Details Patient Name: Date of Service: [name], [name] 08/11/2020 10:45 A M Medical Record Number: [id number] Patient Account Number: [id number] Date of Birth/Sex: Treating RN: 1961/04/19 (60 y.o. [name] Primary Care Provider: [name] Other Clinician: Referring Provider: Treating Provider/Extender: [name], [name] in Treatment: 20 Constitutional Sitting or standing Blood Pressure is within target range for patient.. Pulse regular and within target range for patient.Marland Kitchen Respirations regular, non-labored and within target range.. Temperature is normal and within the target range for the patient.Marland Kitchen Appears in no distress. He does not appear to be systemically unwell. Cardiovascular Pedal pulses are palpable. Notes Wound exam Left medial foot and ankle wound is a lot worse. There is some erythema around this but no real tenderness. 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 It is wrapped in Ace wrap. I did not look at this today. Electronic Signature(s) Signed: 08/11/2020 5:26:06 PM By: Baltazar Najjar MD Entered By: Baltazar Najjar on 08/11/2020 12:13:35 -------------------------------------------------------------------------------- Physician Orders Details Patient Name: Date of Service: [name], [name] 08/11/2020 10:45 A M Medical Record Number: [id number] Patient Account Number: [id number] Date of Birth/Sex: Treating RN: 1961/03/23 (60 y.o. 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 Bathing/ Shower/ Hygiene May shower with protection but do not get wound dressing(s) wet. Other Bathing/Shower/Hygiene Orders/Instructions: - scrub legs gently with wash cloth and soap and water with dressing changes Edema Control - Lymphedema / SCD / Other Bilateral Lower Extremities Elevate legs to the level of the heart or above for 30 minutes daily and/or when sitting, a frequency of: Off-Loading Heel suspension boot to: - heel protectors at all times Other: - avoid any pressure to back of heels Home Health New wound care orders this week; continue Home Health for wound care. May utilize formulary equivalent dressing for wound treatment orders unless otherwise specified. - primary dressing changed-Please change dressing/wrap Mon and Fri and we will see in office Fri. Other Home Health Orders/Instructions: - [facility]: Wound Treatment Wound #47 - Malleolus Wound Laterality: Left, Medial Cleanser: Soap and Water (Home Health) 3 x Per Day/30 Days Discharge Instructions: May shower and wash wound with dial antibacterial soap and water prior to dressing change. Cleanser: Wound Cleanser (Home Health) 3 x Per Day/30 Days Discharge Instructions: Cleanse the wound with wound cleanser prior to applying a clean dressing using gauze  sponges, not tissue or cotton balls. Peri-Wound Care: Sween Lotion (Moisturizing lotion) (Home Health) 3 x Per Day/30 Days Discharge Instructions: Apply moisturizing lotion with dressing changes Prim Dressing: KerraCel Ag Gelling Fiber Dressing, 4x5 in (silver alginate) (Home Health) 3 x Per Day/30 Days ary Discharge Instructions: Apply silver alginate to wound bed as instructed Secondary Dressing: Woven Gauze Sponge, Non-Sterile 4x4 in 3 x Per Day/30 Days Discharge Instructions: Apply over primary dressing as directed. Secondary Dressing: ABD Pad, 8x10 (Home Health) 3 x  [name], [name] ([id number]) Visit Report for 08/11/2020 HPI Details Patient Name: Date of Service: [name], [name] 08/11/2020 10:45 A M Medical Record Number: [id number] Patient Account Number: [id number] Date of Birth/Sex: Treating RN: January 18, 1961 (60 y.o. [name] Primary Care Provider: [name] Other Clinician: Referring Provider: Treating Provider/Extender: [name], [name] in Treatment: 20 History of Present Illness HPI Description: 11/23/15; this is a patient I remember from a previous stay in this clinic in 2012. The patient says this was for a wound in this same area as currently although I have no information to verify that. Most of the information is provided by his caregiver from the group home where he resides. Apparently he has had a wound in this area for 8 months. He also has erythema around this area and has had multiple rounds of antibiotics apparently with some improvement but the erythema is persists. He apparently had a fracture in the ankle 2 months ago and was seen by Dr. Lajoyce Corners . He apparently had a splint applied and more recently a heel offloading boot. He had home health coming out to a week ago not clear what they are dressing this with but they apparently discharged him perhaps because of one of Dr. 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 He has a Nurse, adult for transferring at home he is nonambulatory and does not wear footwear. He is not a diabetic. Looking through [facility] he appears to have fairly active primary care. The area on the ankle is variably labeled as a pressure area and/or chronic venous insufficiency. He had a recent venous ultrasound on 5/25 that did not show a DVT in the left leg however this was not a reflux study. There was no superficial DVT either. Arterial studies on 10/15/14 showed a left ABI of 1.16 Center right of 1.12 waveforms were triphasic he does not  have significant arterial disease. He has had recent x-rays of the left foot and ankle. The left foot did not show any abnormality. Left ankle x-ray showed a spiral fracture of the left tibial diaphysis without evidence of osteomyelitis. 11/30/15 culture I did last week showed pseudomonas I changed him to oral ciprofloxacin. Erythema is a lot better. 12/07/15 area around the wound shows considerable improvement of the periwound erythema. 12/14/15; the patient has a improvement of the periwound erythema which in retrospect I think with cellulitis successfully treated. We have been using Iodoflex started last week 12/25/15 wound on the left medial malleolus and dorsal left foot. Theraskin #1 applied READMISSION 02/27/16; the patient was admitted to hospital from 8/14 through 01/18/16 initially felt to have cellulitis of his left lower leg however he was noted to have significant persistent pain and swelling. A duplex ultrasound was ordered that was negative. Obie Dredge an x-ray was done that showed a spiral fracture of the left leg. With posterior displacement and angulation at the mid left tibia. Orthopedics saw the patient and splinted the leg. He was then sent to [facility] skilled facility and only return to his group home last week. He has wounds on the left medial malleolus, left dorsal foot and a worrisome area on the plantar left heel which is not really open but may represent a hematoma or a DTI 03/06/16; areas of left anterior and medial ankle both look improved. There are 3 small wounds here. He has a area over the dorsal left first metatarsal head which is not open. 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 There was a Three Layer Compression Therapy Procedure by Antonieta Iba, RN. Post procedure Diagnosis Wound #47: Same as Pre-Procedure Plan Follow-up Appointments: Return Appointment in 1 week. Bathing/  Shower/ Hygiene: May shower with protection but do not get wound dressing(s) wet. Other Bathing/Shower/Hygiene Orders/Instructions: - scrub legs gently with wash cloth and soap and water with dressing changes Edema Control - Lymphedema / SCD / Other: Elevate legs to the level of the heart or above for 30 minutes daily and/or when sitting, a frequency of: Off-Loading: Heel suspension boot to: - heel protectors at all times Other: - avoid any pressure to back of heels Home Health: New wound care orders this week; continue Home Health for wound care. May utilize formulary equivalent dressing for wound treatment orders unless otherwise specified. - primary dressing changed-Please change dressing/wrap Mon and Fri and we will see in office Fri. Other Home Health Orders/Instructions: - [facility]: WOUND #47: - Malleolus Wound Laterality: Left, Medial Cleanser: Soap and Water (Home Health) 3 x Per Day/30 Days Discharge Instructions: May shower and wash wound with dial antibacterial soap and water prior to dressing change. Cleanser: Wound Cleanser (Home Health) 3 x Per Day/30 Days Discharge Instructions: Cleanse the wound with wound cleanser prior to applying a clean dressing using gauze sponges, not tissue or cotton balls. Peri-Wound Care: Sween Lotion (Moisturizing lotion) (Home Health) 3 x Per Day/30 Days Discharge Instructions: Apply moisturizing lotion with dressing changes Prim Dressing: KerraCel Ag Gelling Fiber Dressing, 4x5 in (silver alginate) (Home Health) 3 x Per Day/30 Days ary Discharge Instructions: Apply silver alginate to wound bed as instructed Secondary Dressing: Woven Gauze Sponge, Non-Sterile 4x4 in 3 x Per Day/30 Days Discharge Instructions: Apply over primary dressing as directed. Secondary Dressing: ABD Pad, 8x10 (Home Health) 3 x Per Day/30 Days Discharge Instructions: Apply over primary dressing as directed. Com pression Wrap: ThreePress (3 layer compression wrap) (Home  Health) 3 x Per Day/30 Days Discharge Instructions: Apply three layer compression as directed. 1. We put silver alginate under the wound and reinitiated 3 layer compression 2. I agree with the thought of cellulitis Augmentin as ordered I did not change this. 3. He has home health checking this I asked him to come in and change this Monday and Wednesday and we will see it on Friday. If there is deterioration they are to call us 4. I found myself wondering about a deep tissue culture if this worsens Electronic Signature(s) Signed: 08/11/2020 5:26:06 PM By: Baltazar Najjar MD Entered By: Baltazar Najjar on 08/11/2020 12:12:21 -------------------------------------------------------------------------------- SuperBill Details Patient Name: Date of Service: [name] 08/11/2020 Medical Record Number: [id number] Patient Account Number: [id number] Date of Birth/Sex: Treating RN: 04/20/1961 (59 y.o. Lytle Michaels Primary Care Provider: [name] Other Clinician: Referring Provider: Treating Provider/Extender: [name], [name] in Treatment: 20 Diagnosis Coding ICD-10 Codes Code Description T81.31XD Disruption of external operation (surgical) wound, not elsewhere classified, subsequent encounter S90.811D Abrasion, right foot, subsequent encounter L97.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 layer exposed L97.312 Non-pressure chronic ulcer of right  [name], [name] ([id number]) Visit Report for 08/11/2020 HPI Details Patient Name: Date of Service: [name], [name] 08/11/2020 10:45 A M Medical Record Number: [id number] Patient Account Number: [id number] Date of Birth/Sex: Treating RN: January 18, 1961 (60 y.o. [name] Primary Care Provider: [name] Other Clinician: Referring Provider: Treating Provider/Extender: [name], [name] in Treatment: 20 History of Present Illness HPI Description: 11/23/15; this is a patient I remember from a previous stay in this clinic in 2012. The patient says this was for a wound in this same area as currently although I have no information to verify that. Most of the information is provided by his caregiver from the group home where he resides. Apparently he has had a wound in this area for 8 months. He also has erythema around this area and has had multiple rounds of antibiotics apparently with some improvement but the erythema is persists. He apparently had a fracture in the ankle 2 months ago and was seen by Dr. Lajoyce Corners . He apparently had a splint applied and more recently a heel offloading boot. He had home health coming out to a week ago not clear what they are dressing this with but they apparently discharged him perhaps because of one of Dr. 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 He has a Nurse, adult for transferring at home he is nonambulatory and does not wear footwear. He is not a diabetic. Looking through [facility] he appears to have fairly active primary care. The area on the ankle is variably labeled as a pressure area and/or chronic venous insufficiency. He had a recent venous ultrasound on 5/25 that did not show a DVT in the left leg however this was not a reflux study. There was no superficial DVT either. Arterial studies on 10/15/14 showed a left ABI of 1.16 Center right of 1.12 waveforms were triphasic he does not  have significant arterial disease. He has had recent x-rays of the left foot and ankle. The left foot did not show any abnormality. Left ankle x-ray showed a spiral fracture of the left tibial diaphysis without evidence of osteomyelitis. 11/30/15 culture I did last week showed pseudomonas I changed him to oral ciprofloxacin. Erythema is a lot better. 12/07/15 area around the wound shows considerable improvement of the periwound erythema. 12/14/15; the patient has a improvement of the periwound erythema which in retrospect I think with cellulitis successfully treated. We have been using Iodoflex started last week 12/25/15 wound on the left medial malleolus and dorsal left foot. Theraskin #1 applied READMISSION 02/27/16; the patient was admitted to hospital from 8/14 through 01/18/16 initially felt to have cellulitis of his left lower leg however he was noted to have significant persistent pain and swelling. A duplex ultrasound was ordered that was negative. Obie Dredge an x-ray was done that showed a spiral fracture of the left leg. With posterior displacement and angulation at the mid left tibia. Orthopedics saw the patient and splinted the leg. He was then sent to [facility] skilled facility and only return to his group home last week. He has wounds on the left medial malleolus, left dorsal foot and a worrisome area on the plantar left heel which is not really open but may represent a hematoma or a DTI 03/06/16; areas of left anterior and medial ankle both look improved. There are 3 small wounds here. He has a area over the dorsal left first metatarsal head which is not open. In meantime his left

## 2020-08-15 NOTE — Progress Notes (Signed)
08/11/2020 10:45 A M Medical Record Number: [id number] Patient Account Number: [id number] Date of Birth/Sex: Treating  RN: [birth year] (60 y.o. [name], [name] Primary Care Shyne Lehrke: [name] Other Clinician: Referring Shiori Adcox: Treating Danali Marinos/Extender: [name], [name] in Treatment: 20 Active Problems Location of Pain Severity and Description of Pain Patient Has Paino No Site Locations Pain Management and Medication Current Pain Management: Electronic Signature(s) Signed: 08/15/2020 5:23:06 PM By: [name] RN Entered By: [name] on 08/11/2020 11:12:09 -------------------------------------------------------------------------------- Patient/Caregiver Education Details Patient Name: Date of Service: [name] 3/18/2022andnbsp10:45 A M Medical Record Number: [id number] Patient Account Number: [id number] Date of Birth/Gender: Treating RN: August 26, 1960 (60 y.o. [name] Primary Care Physician: [name] Other Clinician: Referring Physician: Treating Physician/Extender: Lamarr Lulas in Treatment: 20 Education Assessment Education Provided To: Patient Education Topics Provided Offloading: Methods: Explain/Verbal, Printed Responses: State content correctly Wound/Skin Impairment: Methods: Explain/Verbal, Printed Responses: State content correctly Electronic Signature(s) Signed: 08/11/2020 5:48:33 PM By: Antonieta Iba Entered By: Antonieta Iba on 08/11/2020 11:47:38 -------------------------------------------------------------------------------- Wound Assessment Details Patient Name: Date of Service: [name], [name] 08/11/2020 10:45 A M Medical Record Number: [id number] Patient Account Number: [id number] Date of Birth/Sex: Treating RN: [birth year] (60 y.o. [name], [name] Primary Care Vence Lalor: [name] Other Clinician: Referring Evey Mcmahan: Treating Berniece Abid/Extender: [name], [name] in Treatment: 20 Wound Status Wound Number: 41 Primary Etiology:  Lymphedema Wound Location: Right T Fourth oe Wound Status: Amputation Wounding Event: Trauma Outcome Level: Below Knee Date Acquired: 02/21/2020 Weeks Of Treatment: 20 Clustered Wound: No Wound Description Classification: Full Thickness Without Exposed Support Structur es Treatment Notes Wound #41 (Toe Fourth) Wound Laterality: Right Cleanser Peri-Wound Care Topical Primary Dressing Secondary Dressing Secured With Compression Wrap Compression Stockings Add-Ons Electronic Signature(s) Signed: 08/15/2020 5:23:06 PM By: [name] RN Entered By: [name] on 08/11/2020 11:13:15 -------------------------------------------------------------------------------- Wound Assessment Details Patient Name: Date of Service: [name], [name] 08/11/2020 10:45 A M Medical Record Number: [id number] Patient Account Number: [id number] Date of Birth/Sex: Treating RN: [birth year] (60 y.o. [name], [name] Primary Care Kristi Hyer: [name] Other Clinician: Referring Connie Lasater: Treating Evlyn Amason/Extender: [name], [name] in Treatment: 20 Wound Status Wound Number: 43 Primary Etiology: Pressure Ulcer Wound Location: Right, Distal, Anterior Lower Leg Secondary Etiology: Venous Leg Ulcer Wounding Event: Pressure Injury Wound Status: Amputation Date Acquired: 02/21/2020 Outcome Level: Below Knee Weeks Of Treatment: 20 Clustered Wound: Yes Wound Description Classification: Category/Stage III Treatment Notes Wound #43 (Lower Leg) Wound Laterality: Right, Anterior, Distal Cleanser Peri-Wound Care Topical Primary Dressing Secondary Dressing Secured With Compression Wrap Compression Stockings Add-Ons Electronic Signature(s) Signed: 08/15/2020 5:23:06 PM By: [name] RN Entered By: [name] on 08/11/2020 11:13:15 -------------------------------------------------------------------------------- Wound Assessment Details Patient  Name: Date of Service: [name], [name] 08/11/2020 10:45 A M Medical Record Number: [id number] Patient Account Number: [id number] Date of Birth/Sex: Treating RN: Apr 23, 1961 (60 y.o. [name], [name] Primary Care Raiden Haydu: [name] Other Clinician: Referring Curlie Sittner: Treating Alford Gamero/Extender: [name], [name] in Treatment: 20 Wound Status Wound Number: 44 Primary Etiology: Pressure Ulcer Wound Location: Right, Proximal, Lateral Foot Wound Status: Amputation Wounding Event: Pressure Injury Outcome Level: Below Knee Date Acquired: 02/21/2020 Weeks Of Treatment: 20 Clustered Wound: No Wound Description Classification: Category/Stage IV Treatment Notes Wound #44 (Foot) Wound Laterality: Right, Lateral, Proximal Cleanser Peri-Wound Care Topical Primary Dressing Secondary Dressing Secured With Compression Wrap Compression Stockings Add-Ons Electronic Signature(s) Signed: 08/15/2020 5:23:06 PM By: [name] RN Entered By: [name] on 08/11/2020 11:13:15 --------------------------------------------------------------------------------  Wound Assessment Details Patient Name: Date of Service: [name], [name] 08/11/2020 10:45 A M Medical Record Number: [id number] Patient Account Number: [id number] Date of Birth/Sex: Treating RN: 1960/08/12 (60 y.o. [name], [name] Primary Care Isiaha Greenup: [name] Other Clinician: Referring Alexismarie Flaim: Treating Kaysin Brock/Extender: [name], [name] in Treatment: 20 Wound Status Wound Number: 47 Primary Venous Leg Ulcer Etiology: Wound Location: Left, Medial Malleolus Wound Open Wounding Event: Gradually Appeared Status: Date Acquired: 05/22/2020 Comorbid Anemia, Deep Vein Thrombosis, Colitis, Osteomyelitis, Weeks Of Treatment: 11 History: Neuropathy, Quadriplegia, Confinement Anxiety Clustered Wound: No Photos Wound Measurements Length: (cm) 8.5 Width:  (cm) 0.5 Depth: (cm) 0.3 Area: (cm) 3.338 Volume: (cm) 1.001 % Reduction in Area: 56.6% % Reduction in Volume: -30% Epithelialization: None Tunneling: No Undermining: No Wound Description Classification: Full Thickness Without Exposed Support Structures Wound Margin: Distinct, outline attached Exudate Amount: Medium Exudate Type: Purulent Exudate Color: yellow, brown, green Foul Odor After Cleansing: No Slough/Fibrino Yes Wound Bed Granulation Amount: Medium (34-66%) Exposed Structure Granulation Quality: Pink Fascia Exposed: No Necrotic Amount: Medium (34-66%) Fat Layer (Subcutaneous Tissue) Exposed: Yes Necrotic Quality: Adherent Slough Tendon Exposed: No Muscle Exposed: No Joint Exposed: No Bone Exposed: No Treatment Notes Wound #47 (Malleolus) Wound Laterality: Left, Medial Cleanser Soap and Water Discharge Instruction: May shower and wash wound with dial antibacterial soap and water prior to dressing change. Wound Cleanser Discharge Instruction: Cleanse the wound with wound cleanser prior to applying a clean dressing using gauze sponges, not tissue or cotton balls. Peri-Wound Care Sween Lotion (Moisturizing lotion) Discharge Instruction: Apply moisturizing lotion with dressing changes Topical Primary Dressing KerraCel Ag Gelling Fiber Dressing, 4x5 in (silver alginate) Discharge Instruction: Apply silver alginate to wound bed as instructed Secondary Dressing Woven Gauze Sponge, Non-Sterile 4x4 in Discharge Instruction: Apply over primary dressing as directed. ABD Pad, 8x10 Discharge Instruction: Apply over primary dressing as directed. Secured With Compression Wrap ThreePress (3 layer compression wrap) Discharge Instruction: Apply three layer compression as directed. Compression Stockings Add-Ons Electronic Signature(s) Signed: 08/14/2020 7:47:40 AM By: Karl Ito Signed: 08/15/2020 5:23:06 PM By: [name] RN Entered By: Karl Ito on  08/13/2020 12:58:24 -------------------------------------------------------------------------------- Wound Assessment Details Patient Name: Date of Service: HUMBLEBonner, Larue 08/11/2020 10:45 A M Medical Record Number: [id number] Patient Account Number: [id number] Date of Birth/Sex: Treating RN: 1960-05-29 (60 y.o. [name], [name] Primary Care Renika Shiflet: [name] Other Clinician: Referring Vinson Tietze: Treating Rosalin Buster/Extender: [name], [name] in Treatment: 20 Wound Status Wound Number: 48 Primary Etiology: Pressure Ulcer Wound Location: Right Calcaneus Wound Status: Amputation Wounding Event: Pressure Injury Outcome Level: Below Knee Date Acquired: 06/02/2020 Weeks Of Treatment: 10 Clustered Wound: No Wound Description Classification: Unstageable/Unclassified Treatment Notes Wound #48 (Calcaneus) Wound Laterality: Right Cleanser Peri-Wound Care Topical Primary Dressing Secondary Dressing Secured With Compression Wrap Compression Stockings Add-Ons Electronic Signature(s) Signed: 08/15/2020 5:23:06 PM By: [name] RN Entered By: [name] on 08/11/2020 11:13:15 -------------------------------------------------------------------------------- Vitals Details Patient Name: Date of Service: [name]. 08/11/2020 10:45 A M Medical Record Number: [id number] Patient Account Number: [id number] Date of Birth/Sex: Treating RN: [birth year] (59 y.o. [name], [name] Primary Care Cowan Pilar: [name] Other Clinician: Referring Taci Sterling: Treating Brynn Mulgrew/Extender: [name], [name] in Treatment: 20 Vital Signs Time Taken: 11:11 Temperature (F): 98.7 Pulse (bpm): 74 Respiratory Rate (breaths/min): 17 Blood Pressure (mmHg): 134/74 Reference Range: 80 - 120 mg / dl Electronic Signature(s) Signed: 08/15/2020 5:23:06 PM By: [name] RN Entered By: [name] on 08/11/2020  11:12:02  [name] RhineHUMBLE, [name] ([id number]) Visit Report for 08/11/2020 Arrival Information Details Patient Name: Date of Service: [name] RhineHUMBLE, [name] 08/11/2020 10:45 A M Medical Record Number: [id number] Patient Account Number: [id number] Date of Birth/Sex: Treating RN: 1961/04/20 (60 y.o. [name] MerlM) Breedlove, [name] Primary Care Quadarius Henton: Jamison OkaBakare, Mobolaji Other Clinician: Referring Herberth Deharo: Treating Halie Gass/Extender: Alethia Bertholdobson, Michael Bakare, [name] in Treatment: 20 Visit Information History Since Last Visit Added or deleted any medications: No Patient Arrived: Wheel Chair Any new allergies or adverse reactions: No Arrival Time: 11:11 Had a fall or experienced change in No Accompanied By: cg activities of daily living that may affect Transfer Assistance: None risk of falls: Patient Identification Verified: Yes Signs or symptoms of abuse/neglect since last visito No Secondary Verification Process Completed: Yes Hospitalized since last visit: No Patient Requires Transmission-Based Precautions: No Implantable device outside of the clinic excluding No Patient Has Alerts: No cellular tissue based products placed in the center since last visit: Has Dressing in Place as Prescribed: Yes Pain Present Now: No Electronic Signature(s) Signed: 08/15/2020 5:23:06 PM By: Fonnie MuBreedlove, [name] RN Entered By: Fonnie MuBreedlove, [name] on 08/11/2020 11:11:26 -------------------------------------------------------------------------------- Compression Therapy Details Patient Name: Date of Service: [name] RhineHUMBLE, [name] 08/11/2020 10:45 A M Medical Record Number: [id number] Patient Account Number: [id number] Date of Birth/Sex: Treating RN: 1961/04/20 (60 y.o. [name] MichaelsM) Barnhart, Jodi Primary Care Anslee Micheletti: Jamison OkaBakare, Mobolaji Other Clinician: Referring Jarrick Fjeld: Treating Tiara Maultsby/Extender: Alethia Bertholdobson, Michael Bakare, [name] in Treatment: 20 Compression Therapy Performed for Wound Assessment: Wound #47 Left,Medial  Malleolus Performed By: Clinician Antonieta IbaBarnhart, Jodi, RN Compression Type: Three Layer Post Procedure Diagnosis Same as Pre-procedure Electronic Signature(s) Signed: 08/11/2020 5:48:33 PM By: Antonieta IbaBarnhart, Jodi Entered By: Antonieta IbaBarnhart, Jodi on 08/11/2020 11:56:14 -------------------------------------------------------------------------------- Encounter Discharge Information Details Patient Name: Date of Service: [name] RhineHUMBLE, [name] 08/11/2020 10:45 A M Medical Record Number: [id number] Patient Account Number: [id number] Date of Birth/Sex: Treating RN: 1961/04/20 (60 y.o. Tammy SoursM) Deaton, Bobbi Primary Care Nicole Defino: Jamison OkaBakare, Mobolaji Other Clinician: Referring Birdena Kingma: Treating Jaziya Obarr/Extender: Alethia Bertholdobson, Michael Bakare, [name] in Treatment: 20 Encounter Discharge Information Items Discharge Condition: Stable Ambulatory Status: Wheelchair Discharge Destination: Home Transportation: Private Auto Accompanied By: caregiver Schedule Follow-up Appointment: Yes Clinical Summary of Care: Electronic Signature(s) Signed: 08/11/2020 5:28:19 PM By: Shawn Stalleaton, Bobbi Entered By: Shawn Stalleaton, Bobbi on 08/11/2020 13:23:41 -------------------------------------------------------------------------------- Lower Extremity Assessment Details Patient Name: Date of Service: [name] RhineHUMBLE, [name] 08/11/2020 10:45 A M Medical Record Number: [id number] Patient Account Number: [id number] Date of Birth/Sex: Treating RN: 1961/04/20 (60 y.o. [name] MerlM) Breedlove, [name] Primary Care Tahiri Shareef: Jamison OkaBakare, Mobolaji Other Clinician: Referring Tyne Banta: Treating Syd Manges/Extender: Alethia Bertholdobson, Michael Bakare, [name] in Treatment: 20 Edema Assessment Assessed: [Left: No] [Right: No] Edema: [Left: Ye] [Right: s] Calf Left: Right: Point of Measurement: From Medial Instep 29 cm Ankle Left: Right: Point of Measurement: From Medial Instep 19 cm Vascular Assessment Pulses: Dorsalis Pedis Palpable: [Left:Yes] Posterior Tibial Palpable:  [Left:Yes] Electronic Signature(s) Signed: 08/15/2020 5:23:06 PM By: Fonnie MuBreedlove, [name] RN Entered By: Fonnie MuBreedlove, [name] on 08/11/2020 11:12:33 -------------------------------------------------------------------------------- Multi Wound Chart Details Patient Name: Date of Service: [name] RhineHUMBLE, [name] 08/11/2020 10:45 A M Medical Record Number: [id number] Patient Account Number: [id number] Date of Birth/Sex: Treating RN: 1961/04/20 (60 y.o. Damaris SchoonerM) Boehlein, Linda Primary Care Mattias Walmsley: Jamison OkaBakare, Mobolaji Other Clinician: Referring Jasiya Markie: Treating Ana Liaw/Extender: Alethia Bertholdobson, Michael Bakare, [name] in Treatment: 20 Vital Signs Height(in): Pulse(bpm): 74 Weight(lbs): Blood Pressure(mmHg): 134/74 Body Mass Index(BMI): Temperature(F): 98.7 Respiratory Rate(breaths/min): 17 Photos: [41:No Photos Right T Fourth oe] [43:No Photos Right, Distal, Anterior Lower Leg] [44:No Photos Right, Proximal, Lateral Foot] Wound Location: [41:Trauma] [43:Pressure Injury] [  [name] RhineHUMBLE, [name] ([id number]) Visit Report for 08/11/2020 Arrival Information Details Patient Name: Date of Service: [name] RhineHUMBLE, [name] 08/11/2020 10:45 A M Medical Record Number: [id number] Patient Account Number: [id number] Date of Birth/Sex: Treating RN: 1961/04/20 (60 y.o. [name] MerlM) Breedlove, [name] Primary Care Quadarius Henton: Jamison OkaBakare, Mobolaji Other Clinician: Referring Herberth Deharo: Treating Halie Gass/Extender: Alethia Bertholdobson, Michael Bakare, [name] in Treatment: 20 Visit Information History Since Last Visit Added or deleted any medications: No Patient Arrived: Wheel Chair Any new allergies or adverse reactions: No Arrival Time: 11:11 Had a fall or experienced change in No Accompanied By: cg activities of daily living that may affect Transfer Assistance: None risk of falls: Patient Identification Verified: Yes Signs or symptoms of abuse/neglect since last visito No Secondary Verification Process Completed: Yes Hospitalized since last visit: No Patient Requires Transmission-Based Precautions: No Implantable device outside of the clinic excluding No Patient Has Alerts: No cellular tissue based products placed in the center since last visit: Has Dressing in Place as Prescribed: Yes Pain Present Now: No Electronic Signature(s) Signed: 08/15/2020 5:23:06 PM By: Fonnie MuBreedlove, [name] RN Entered By: Fonnie MuBreedlove, [name] on 08/11/2020 11:11:26 -------------------------------------------------------------------------------- Compression Therapy Details Patient Name: Date of Service: [name] RhineHUMBLE, [name] 08/11/2020 10:45 A M Medical Record Number: [id number] Patient Account Number: [id number] Date of Birth/Sex: Treating RN: 1961/04/20 (60 y.o. [name] MichaelsM) Barnhart, Jodi Primary Care Anslee Micheletti: Jamison OkaBakare, Mobolaji Other Clinician: Referring Jarrick Fjeld: Treating Tiara Maultsby/Extender: Alethia Bertholdobson, Michael Bakare, [name] in Treatment: 20 Compression Therapy Performed for Wound Assessment: Wound #47 Left,Medial  Malleolus Performed By: Clinician Antonieta IbaBarnhart, Jodi, RN Compression Type: Three Layer Post Procedure Diagnosis Same as Pre-procedure Electronic Signature(s) Signed: 08/11/2020 5:48:33 PM By: Antonieta IbaBarnhart, Jodi Entered By: Antonieta IbaBarnhart, Jodi on 08/11/2020 11:56:14 -------------------------------------------------------------------------------- Encounter Discharge Information Details Patient Name: Date of Service: [name] RhineHUMBLE, [name] 08/11/2020 10:45 A M Medical Record Number: [id number] Patient Account Number: [id number] Date of Birth/Sex: Treating RN: 1961/04/20 (60 y.o. Tammy SoursM) Deaton, Bobbi Primary Care Nicole Defino: Jamison OkaBakare, Mobolaji Other Clinician: Referring Birdena Kingma: Treating Jaziya Obarr/Extender: Alethia Bertholdobson, Michael Bakare, [name] in Treatment: 20 Encounter Discharge Information Items Discharge Condition: Stable Ambulatory Status: Wheelchair Discharge Destination: Home Transportation: Private Auto Accompanied By: caregiver Schedule Follow-up Appointment: Yes Clinical Summary of Care: Electronic Signature(s) Signed: 08/11/2020 5:28:19 PM By: Shawn Stalleaton, Bobbi Entered By: Shawn Stalleaton, Bobbi on 08/11/2020 13:23:41 -------------------------------------------------------------------------------- Lower Extremity Assessment Details Patient Name: Date of Service: [name] RhineHUMBLE, [name] 08/11/2020 10:45 A M Medical Record Number: [id number] Patient Account Number: [id number] Date of Birth/Sex: Treating RN: 1961/04/20 (60 y.o. [name] MerlM) Breedlove, [name] Primary Care Tahiri Shareef: Jamison OkaBakare, Mobolaji Other Clinician: Referring Tyne Banta: Treating Syd Manges/Extender: Alethia Bertholdobson, Michael Bakare, [name] in Treatment: 20 Edema Assessment Assessed: [Left: No] [Right: No] Edema: [Left: Ye] [Right: s] Calf Left: Right: Point of Measurement: From Medial Instep 29 cm Ankle Left: Right: Point of Measurement: From Medial Instep 19 cm Vascular Assessment Pulses: Dorsalis Pedis Palpable: [Left:Yes] Posterior Tibial Palpable:  [Left:Yes] Electronic Signature(s) Signed: 08/15/2020 5:23:06 PM By: Fonnie MuBreedlove, [name] RN Entered By: Fonnie MuBreedlove, [name] on 08/11/2020 11:12:33 -------------------------------------------------------------------------------- Multi Wound Chart Details Patient Name: Date of Service: [name] RhineHUMBLE, [name] 08/11/2020 10:45 A M Medical Record Number: [id number] Patient Account Number: [id number] Date of Birth/Sex: Treating RN: 1961/04/20 (60 y.o. Damaris SchoonerM) Boehlein, Linda Primary Care Mattias Walmsley: Jamison OkaBakare, Mobolaji Other Clinician: Referring Jasiya Markie: Treating Ana Liaw/Extender: Alethia Bertholdobson, Michael Bakare, [name] in Treatment: 20 Vital Signs Height(in): Pulse(bpm): 74 Weight(lbs): Blood Pressure(mmHg): 134/74 Body Mass Index(BMI): Temperature(F): 98.7 Respiratory Rate(breaths/min): 17 Photos: [41:No Photos Right T Fourth oe] [43:No Photos Right, Distal, Anterior Lower Leg] [44:No Photos Right, Proximal, Lateral Foot] Wound Location: [41:Trauma] [43:Pressure Injury] [

## 2020-08-18 ENCOUNTER — Encounter (HOSPITAL_BASED_OUTPATIENT_CLINIC_OR_DEPARTMENT_OTHER): Payer: Medicare Other | Admitting: Internal Medicine

## 2020-08-18 ENCOUNTER — Other Ambulatory Visit: Payer: Self-pay

## 2020-08-18 DIAGNOSIS — T8781 Dehiscence of amputation stump: Secondary | ICD-10-CM | POA: Diagnosis not present

## 2020-08-21 NOTE — Progress Notes (Signed)
[name], [name] ([id number]) Visit Report for 08/18/2020 HPI Details Patient Name: Date of Service: [name], [name] 08/18/2020 11:00 A M Medical Record Number: [id number] Patient Account Number: [id number] Date of Birth/Sex: Treating RN: 30-Mar-1961 (60 y.o. Lytle Michaels Primary Care Provider: [name] Other Clinician: Referring Provider: Treating Provider/Extender: [name], [name] in Treatment: 21 History of Present Illness HPI Description: 11/23/15; this is a patient I remember from a previous stay in this clinic in 2012. The patient says this was for a wound in this same area as currently although I have no information to verify that. Most of the information is provided by his caregiver from the group home where he resides. Apparently he has had a wound in this area for 8 months. He also has erythema around this area and has had multiple rounds of antibiotics apparently with some improvement but the erythema is persists. He apparently had a fracture in the ankle 2 months ago and was seen by Dr. Lajoyce Corners . He apparently had a splint applied and more recently a heel offloading boot. He had home health coming out to a week ago not clear what they are dressing this with but they apparently discharged him perhaps because of one of Dr. 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 He has a Nurse, adult for transferring at home he is nonambulatory and does not wear footwear. He is not a diabetic. Looking through [place] [facility] he appears to have fairly active primary care. The area on the ankle is variably labeled as a pressure area and/or chronic venous insufficiency. He had a recent venous ultrasound on 5/25 that did not show a DVT in the left leg however this was not a reflux study. There was no superficial DVT either. Arterial studies on 10/15/14 showed a left ABI of 1.16 Center right of 1.12 waveforms were triphasic he does not have  significant arterial disease. He has had recent x-rays of the left foot and ankle. The left foot did not show any abnormality. Left ankle x-ray showed a spiral fracture of the left tibial diaphysis without evidence of osteomyelitis. 11/30/15 culture I did last week showed pseudomonas I changed him to oral ciprofloxacin. Erythema is a lot better. 12/07/15 area around the wound shows considerable improvement of the periwound erythema. 12/14/15; the patient has a improvement of the periwound erythema which in retrospect I think with cellulitis successfully treated. We have been using Iodoflex started last week 12/25/15 wound on the left medial malleolus and dorsal left foot. Theraskin #1 applied READMISSION 02/27/16; the patient was admitted to hospital from 8/14 through 01/18/16 initially felt to have cellulitis of his left lower leg however he was noted to have significant persistent pain and swelling. A duplex ultrasound was ordered that was negative. Obie Dredge an x-ray was done that showed a spiral fracture of the left leg. With posterior displacement and angulation at the mid left tibia. Orthopedics saw the patient and splinted the leg. He was then sent to [facility] skilled facility and only return to his group home last week. He has wounds on the left medial malleolus, left dorsal foot and a worrisome area on the plantar left heel which is not really open but may represent a hematoma or a DTI 03/06/16; areas of left anterior and medial ankle both look improved. There are 3 small wounds here. He has a area over the dorsal left first metatarsal head which is not open. In meantime his left  heel has opened 03/14/16 the medial left ankle wounds looks stable to improved. The area on the posterior left heel has opened up and had a very adherent eschar and slept over this 03/21/16 patient has 3 wounds on his left medial ankle is stable area on his left dorsal first metatarsal head appears to be improved.  Continued problems with adherent eschar over the left heel 03/28/16; patient has 3 wounds on his left medial ankle and lower leg all of these appear to be stable. The problem continues to be the left heel and the adherent eschar. This may have been a pressure ulcer from her brace at one point. He has a English as a second language teacher we have been using [employer] and all wound areas. Dressings are being changed by home health 04/04/16; the patient's areas on the medial aspect of the left ankle looks superficial and appear to be gradually closing. The area on the plantar aspects/Achilles aspect of his left heel as surface slough and nonviable subcutaneous tissue requiring debridement. 04/11/16; the areas on the patient's medial left ankle appear to be superficial and appear to be closing. The area on the plantar heel on the left has surface slough and nonviable subcutaneous tissue requiring debridement. I don't think this is too much different from last week there is also undermining 04/25/16; patient is followed in his group home by [facility] home health. The area on the plantar heel is smaller but with still with some depth. We're using Iodoflex to this. The areas on the left ankle looks healthy but dry and somewhat larger. Cause of this is not really clear using Prisma 05/02/16; areas x3 all look improved expecially the areas on the medial ankle. Heel wound is smaller but still deep 05/09/16; all areas 3 look improved on the medial ankle. Heel wound is also contracting. 12/0.9/17 the areas on his left medial ankle are all closed. I think these are largely chronic venous insufficiency however there is no edema. The area on his left heel was a pressure area. READMISSION 08/02/16; this man is a patient that we have had in this clinic on several times before. I believe he has cerebral palsy is nonambulatory. He has chronic venous insufficiency with venous inflammation. He was last year in the fall of 2017 with areas on his  left medial ankle left heel and left dorsal foot. He arrives today with once again a vague history. As mentioned he is nonambulatory he spends most of his day in a pressure offloading boot nevertheless they noted wounds in this area on the medial left ankle 2 weeks ago. There is any other history here I am uncertain 08/15/16; the patient's wounds on his medial ankle actually look better however the area on the left lateral/dorsal foot is covered in black necrotic eschar. Previous formal arterial studies did not show significant arterial disease with normal ABIs and triphasic waveforms. He is immobilized and at risk for pressure although he seems aware protective boots at all times as far as I am aware. 08/22/16; in general his wounds look better. All the wounds on his medial left ankle look improved the area on his lateral foot however continues to be problematic. Patient comes in the clinic today in a new wheelchair he is quite delighted 09/05/16; a wound on the left medial Calf and left dorsal foot are healed. The area on the left lateral foot is improved however the area on the left medial malleolus is inflamed with a considerable degree of surrounding erythema. This  looks like cellulitis. ABIs and clinical exam are within normal limits 09/12/16; the patient continues to have a wound on the left medial calf. I was concerned about cellulitis in this area last week and gave him a course of doxycycline. The wound looks better here. The area on the left lateral foot however is unchanged 09/20/16; the patient came in this week early after traumatizing his toe on the left foot on a table while moving around in his motorized wheelchair. 09/26/16- he is here for follow-up evaluation of his left foot and malleolus ulcers. Since his appointment he abraded his left medial hallux against a table while in his motorized wheelchair, apparently trying to pull up to the table for a meal. He continues to complain of  tenderness to the left lateral foot, surrounding the current ulcer 10/07/16; left foot and malleolus ulcers. 10/14/16; the patient is doing well. He has a wound on the left lateral, left medial malleolus and atraumatic wound on the left great toe. Although these appear to be superficial and closing 10/31/16; the patient is doing well. The area on the left lateral foot, left medial malleolus and the left great toe or all epithelialized. He has home health 11/14/16; patient arrives today with the area on the left great toe healed. The left lateral foot however in the left medial malleolus and the ankle superficial wounds that are slightly larger. He has erythema and tenderness around the area on the left lateral foot. As well he is complaining of dysuria. His caregiver notes that when he gets like this he is more lethargic which indeed seems to be the case today 11/21/16; left great toe is still healed. His wound on the left lateral foot appears healthy and slightly smaller also the area on the left medial ankle. This had black eschar on it which I removed. The remaining wound looks satisfactory 12/12/16; patient hasn't been here in a number of weeks. The area on the left lateral foot is healed however on his medial ankle has 2 open areas one of them covered with a necrotic eschar. He also has a new wound on the right second toe which was apparently trauma induced while he was at a movie 12/27/16; the patient has wounds on his left medial mallelus and left lateral foot that are just about closed. The area on the right second toe which was trauma from last time is still open but again everything looks a lot better here. 01/30/17; patient arrives in clinic today and is really been a long time since I've seen him. The wounds are all epithelialized on the left foot this includes his left lateral foot, left medial malleolus. Apparently was in the ER for what of I think was a fungal infection on his buttock's he was  prescribed Diflucan and doxycycline I think this is already getting better. READMISSION T is a now 60 year old man who has advanced cerebral palsy and functional quadriplegia. We have had him in this clinic multiple times before for wounds erry on his right ankle, left ankle, left dorsal foot and most recently from 08/02/16 through 01/30/17 with his left medial ankle and left foot and finally right second toe. His attendant from the group home where he lives is very knowledgeable about him. She states that they noticed breakdown in his skin in his feet sometime in January although they could not get an appointment until today in this clinic. The previous wounds have always been felt to be secondary to incidental trauma/pressure areas  or possibly some degree of chronic venous insufficiency. He had normal arterial studies in may of 2016. He has definite open wounds on the right lateral foot over the base of the fifth metatarsal, the right second toe DIP, the left medial malleolus. He has concerning areas over the left lateral fifth metatarsal base, retaform purpura over the medial left ankle and perhaps the medial left first toe. 07/21/17; culture we did last week from the base of the right fifth metatarsal grew group G strep. This should've been sensitive to the doxycycline I gave him. The other major wounds are on the right second toe DIP, left medial malleolus, base of the left lateral fifth metatarsal and the medial left ankle and medial left toe. 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 We have macrovascular arterial studies scheduled for Thursday although I don't think this is what the problem is. 07/28/17; patient arrives with really no improvement. He has a wound on the right second toe, lateral right foot, right medial malleolus which was new this week, left medial malleolus, but looked like a DTI on the left great toe. His macrovascular studies are on 07/30/17 although I would be  surprised if this provides all the explanation. He has small areas even on his right second and third toe which look like splinter-type hemorrhages. The question I have is where is this coming from. I'd like to see the vascular studies however before we pursue this. He has had a DVT in the past but is no longer on anticoagulants. I'll need to review his history 08/04/17; the patient had his ARTERIAL STUDIES this showed a right ABI of 1.16 and the left ABI of 1.20. TBI on the left at 0.84. He is at a right first toe amputation. The studies were unchanged from studies in May 2016. It was not felt that he had significant lower extremity arterial disease In the meantime we are using silver alginate to the wounds on the right second toe right lateral foot left medial malleolus. I'm going to change to Silver collagen today. 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 Working him up for this would include an echocardiogram probably a CT scan of the chest and abdomen to look at the major thoracic and abdominal aortic vessels. I am not going to involve myself in this unless there is more symptoms/signs 08/18/17; the patient arrives today with wounds looking somewhat better. The remaining areas are the right second toe dorsal, right lateral foot and left medial malleolus which only has a small open area. We've been using silver collagen 09/01/17;right second toe dorsally probes to bone today. This is a deterioration. We've been using silver collagen. Only a small open area remains on the left medial malleolus. 09/09/17; right second toe appears better. There is no probing bone. He has a small area remaining on the left medial malleolus, the right lateral foot. We have been using silver collagen 10/07/17; the patient returns to clinic today after a month hiatus. He has wounds over the right  second toe, right lateral foot and the left medial malleolus. We have been using silver collagen. He has home health. 10/24/17; patient has open wounds over the right second toe, now the left fourth toe and a superficial area over his left buttock. Some of the other areas have healed including the left medial malleolus and right lateral foot. Using silver collagen on these  wounds 11/07/17; the patient has closed his wounds on the right second toe and left fourth toe although they look all normal. Last time he was here he had a superficial area over the left buttock this is also closed. The right lateral foot wound I had closed out last time although it is seems to have reopened today. 628/19 the areas over the right second and left fourth toe remains closed. The right lateral foot has tightly adherent necrotic surface over. We have been using silver collagen he offloads this and a bunny boot 12/12/17; the patient has reopened the recurrent area over the right second PIP joint. 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 He does not have obvious macrovascular disease. The area over the right lateral foot looks about the same as last time tightly adherent necrotic debris. We've been using Hydrofera Blue 12/25/17; 2 week follow-up. The area over the right second PIP is closed however the area on the right lateral foot is worse with surrounding erythema and pain compatible with cellulitis is going to need an antibiotic. AS WELL..... We have looked at his buttocks and lower back. He has 3 stage II ulcers. The exact history here is unclear however he is very disabled man with cerebral palsy. He goes to a day program from the group home in which she lives 5 days a week and I think he is on his buttock all day on these visits. Surrounding the wounds see has extensive stage I injury as well. FINALLY it looks as though he has lost weight. I'm not the only  staff member that is recognized this 01/08/18 on evaluation today patient appears to be doing better in regard to his gluteal wounds. In fact everything appears to be healed back here although very dry. He has a couple open sores on the scrotal region other than this it is really his foot that is still giving him the most trouble. He did complete the Keflex unfortunately it seems like there's still a lot of erythema in this in fact looks worse than it did previous. I'm gonna culture this today and likely start with a different antibiotic. 01/16/18; I was reassured that the wounds on his buttocks and sacrum are healed. I did not look at these the day. The culture that was done last week on 01/11/18 of the right lateral foot wounds showed Pseudomonas. Bactrim is not a good choice for this. Ciprofloxacin interacts with his muscle relaxants therefore I prescribed cefdinir 300 twice a day for 7 days. Bactrim coverage of pseudomonas is not reliable although the degree of erythema looks a lot better 01/30/18; the patient has completed his antibiotics. The area does not have infection on the right lateral foot. The wounds look a lot better. We've been using silver alginate 02/12/18; there is on the right lateral foot. Copious amounts of surface eschar. Also similarly over the PIP of the right second toe. We've been using silver alginate to date 03/02/2018; the area that remains is on the right lateral foot. The right second toe is healed. 03/16/2018; the area there is original wound on the right lateral foot. Still requiring debridement. We are using Hydrofera Blue. The right second toe is still healed/closed 03/30/2018; right lateral foot there is 2 small open areas remaining. They actually look quite healthy we have been using Hydrofera Blue the right second toe remains closed 04/13/2018; right lateral foot. 1 of the 2 small open  areas from last time has closed over. The other had surface slough that I removed  with a #3 curette. This looks quite healthy. The right second toe remains closed. We are using Hydrofera Blue 05/01/2018; right lateral foot very superficial areas that look like they are trying to close over. He had a new threatened area on the left medial ankle just below the medial malleolus. I think these represent chronic venous insufficiency areas 05/14/2018; the right right lateral foot looks like it closed over as was predicted last time however he has some dark areas subcutaneously erythema superiorly and this is really very tender. This is either coexistent cellulitis or perhaps a deep tissue injury. The area on the left medial ankle is superficial but has some size. We have been using Hydrofera Blue to both areas. The patient complains of pain in his right foot 06/09/2018; right lateral foot was closed over last time but there was coexistent cellulitis around this. I gave him Levaquin. He also has an area on the left medial malleolus. He has not been back to clinic in over 3-1/2 weeks. We are using Hydrofera Blue I think to the left medial malleolus and silver alginate to the right lateral foot 1/21; right lateral foot still non-viable debris over this area and the left medial malleolus. Both of these vigorously washed off with antiseptic gauze. We have been using silver alginate. 2/4; right lateral foot this looks somewhat better. Unfortunately the area over the left medial malleolus has deteriorated. Using silver alginate in both area 2/18; both wounds arrived today covered in a lot of necrotic debris. We have been using silver alginate. Clearly not making a lot of progress. 3/3; the patient's right second toe is reopened over the PIP. I am not sure why this is happening. He may have microvascular disease. We have checked his macrovascular flow in the past and found it to be normal. He may need an x-ray. The area on the right lateral foot requires debridement. The area on the left lateral  ankle is worse. We have been using PolyMem Ag 08/26/18 on evaluation today patient appears to be doing a little worse in regard to his lower extremities based on what I'm seeing what the nursing staff is telling me. His left medial ankle appears potential to be infected there is your thing on want surrounding the wound. His right second toe is also of concern at this point in my pinion. We have not done x-rays of these areas that may be a good idea to go ahead and proceed with at this point to ensure will not deal with anything such as osteomyelitis. 4/16; it is been a while since I saw this patient. He now has wounds on the right lateral foot, dorsal right second toe, substantially on the left medial malleolus and a small area on the left lateral foot. We have been using PolyMem Silver. Curlex and Coban. He has home health changing the dressing. Since last time he was here he had a culture of the left medial ankle that showed MRSA. He is completed a course/7 days of doxycycline. XRAYS of the left ankle and right foot that were ordered were negative for osteomyelitis 4/30; he arrives in clinic today with some maceration around the wound and a lot of tenderness and erythema in a circumferential area around the right lateral foot. He has wound areas on the right lateral foot dorsal right second toe, largely over the left medial malleolus and a small area over  the left lateral foot 5/7; completed his antibiotics from last time right lateral foot cellulitis is better and the area on his left knee which we discovered after he had been seen by the provider also is improved. He still has extensive wound areas over the right lateral foot, left medial malleolus, right second toe and left lateral foot although all of these look somewhat better with PolyMem 5/15. Wounds on the right lateral foot, right second toe left medial malleolus and the left lateral foot. All of these look somewhat better. We have been  using PolyMem Ag 5/26; the wound on the right lateral foot is almost closed over but he still complains of a lot of pain in this area. He has a eschared area on the right second toe but no open wound.. Also concerning today his original wound on the left medial malleolus looks healthy however just inferior to this almost areas of deep tissue injury and there is an area on the left lateral foot also looks like a deep tissue injury. In the past I have wondered whether he might have cholesterol emboli or some other form of a vasculopathy. I wonder whether this could have something to do with this. 6/2; right lateral foot wound is still open I gave him empiric antibiotics last week without really a lot of change she is still complaining of pain in this area. We x- rayed his feet at some point in April these were negative. It would be difficult to do an MRI. The area on his left medial malleolus looks better. Right second toe is healed. He still has what looks to be a small pressure related area on the left lateral foot but it is not open. We are using PolyMem to all wound which seems to be doing nicely 6/16; right lateral foot wound is still open. The wound looks better although there is still a lot of tenderness around it. Rather than more empiric antibiotics I have cultured this today. We are using PolyMem Ag. On the left lateral malleolus the wound appears better. He has a new area on the left lower anterior pretibial area 6/29; 2-week follow-up. Culture I did of the right lateral foot 2 weeks ago showed MRSA. I gave him 2 weeks of Septra which he should be just about finishing now and this seems better. We got a call from home health last week to report swelling in the left leg because we were listed as his primary. We referred him back to primary care. He arrives today with intense erythema and tenderness around the 2 wounds on the left medial ankle on the left lower calf. The area on the right  lateral foot looks better 7/7; I brought him back this week to follow-up on his left foot which had cellulitis last week. I gave him clindamycin which as far as I know he tolerated. The erythema is better. He has a 3 current wounds one on the right lateral foot a small area on the right lateral malleolus and the more recent area on the left anterior pretibial area. 7/21; bilateral distal lower leg and foot wounds. The area on his right lateral foot looks a lot better. He has an area on his left pretibial area distally. The area on the medial malleolus on the left which is 1 of his worst wounds has closed over. He does not appear to have any open areas in his remaining toes 8/4-Patient returns at 2 weeks, he has the remaining wound on  the left lower leg the other 2 wounds have healed. Patient is continued to be treated with silver alginate with a 2 layer wrap on the left 8/18; I been following this patient for a long period for bilateral lower extremity wounds. 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 His wounds are probably pressure although he religiously wears thick bunny boots. He does not have macrovascular disease but I did formal testing on him perhaps 2 or 3 years ago. I have often wondered about either cholesterol emboli or a vasculopathy of some sort given the distal nature of these variable locations etc. He has 2 areas that we have been following one on the left medial lower leg and one on the right lateral foot. Both of these are mostly healed today there is some eschar over sections of both areas although I did not disturb this. 03/31/2019 on evaluation today patient appears to be doing a little bit more poorly in regard to the sacral region. He has 2 small wounds noted at this point. Fortunately there is no signs of active infection at this time. No fevers, chills, nausea, vomiting, or diarrhea. He is having some pain when he sits on  his gluteal region a big part of the reason he has these wounds is likely due to the fact that his wheelchair has not been functioning properly. He does still have a couple areas of deep tissue injury on his lower extremities he does need new Prevalon offloading boots as well. 04/28/2019 on evaluation today patient appears to be doing well with regard to his wounds in particular. His ankle area has fairly small based on what I am seeing currently and his sacral region actually is still open but does not appear to be doing too poorly in my opinion. Fortunately there is no signs of active infection at this time. He was in the hospital unfortunately and this was from 04/08/2019 through 04/12/2019 secondary to persistent diarrhea, dehydration and acute kidney injury. With that being said fortunately the patient does not seem to be doing too poorly at this time which is good news. He is having pain mainly in the sacral region. 06/23/2019 on evaluation today patient appears to be doing well with regard to his sacral region which in fact is completely healed. Unfortunately he has an area of rubbing/pressure on his right lateral chest/back region. This is where it is rubbing up on his chair. Subsequently he also has an open wound on the left medial malleolus which is not very open but starting to crack and then on the right lateral foot this is much more open at this point. Unfortunately there is no signs of significant improvement in regard to these areas and in fact there does appear to be evidence of active infection quite significantly. No fevers, chills, nausea, vomiting, or diarrhea. The patient did test positive for COVID-19 that has been greater than 14 days ago although they did not know the exact timing. That is why he was not here for his last appointment. 07/07/2019 upon evaluation today patient actually appears to be doing quite well in regard to his left ankle as well as the chest/back region which  is doing great in fact appears to be healed in my opinion. He still has an issue on his lateral right foot that is open and appears to be infected but does have me more concerned. In fact I do believe the doxycycline helped in general for the  cellulitis but I believe that may have been more MRSA related based on the culture that we did. What I am seeing right now has more of the blue-green drainage and may be more consistent with a Pseudomonas that I was hoping was not really causing much of an issue but I think it may be at this point on the right lateral foot. For that reason I do think treatment would be a good idea of the reason I avoided the Cipro before was the potential for QT prolongation which could obviously cause problems with the patient with results of interaction with respiratory wound. The tizanidine also had some interactions as well. Nonetheless I think at this point that it would be a possibility for [place] to use topical gentamicin to see if this could be of benefit for the patient underneath the silver alginate dressing. 07/21/2019 upon evaluation today patient appears to be doing fairly well at this point in regard to his lower extremity on the right. He does have a wound on his right lateral foot as well as the right fifth toe and the right fourth toe. Unfortunately he continues to experience significant issues with pressure and trauma to some degree and I think this is just due to the fact that again he is not really not able to control his legs and what is happening here. Fortunately there is no signs of active infection at this time. 08/04/2019 upon evaluation today patient actually appears to be doing quite well with regard to his toe ulcers I am very pleased in that regard. Unfortunately the foot ulcer is showing some signs of erythema his primary care provider has placed him on clindamycin at this point. Again I do believe this can be beneficial for him. With that being said I do  think the patient may benefit from a light Kerlix and Coban wrap to help with some of his edema at this point. 08/18/2019 upon evaluation today patient appears to be doing still poorly in regard to his lateral foot ulcer. This is not very deep but there is some concern about the possibility of osteomyelitis. I think it would be appropriate for [place] to go ahead and have an x-ray at this time. The patient is currently on clindamycin which from his culture which was performed on 06/23/2019 it does appear that he would benefit from that in regard to the MRSA. However Pseudomonas was also identified then and the patient actually has some blue-green drainage at this point as well. I am much more concerned about the possibility of the Pseudomonas being an infection is causative agent at this point that I am the MRSA. Subsequently Cipro the patient was sensitive to but we never use that is stable use a topical gent due to the fact that unfortunately there was some interaction between the Cipro and the patient's risk for down potentially causing QT prolongation. 08/31/2019; have not seen this patient in quite a period of time. He has a substantial wound on his right lateral foot we have been using gentamicin ointment with Hydrofera Blue over the top. From what I am able to determine he is also on antibiotics perhaps clindamycin although I have not had a chance to look over this. He also has an eschared area on the right fifth toe as well as the right second toe. 4/20; right lateral foot wound. He comes in today with intense erythema and tenderness around this wound. The wound itself has a surface on it but it doesn't  look particularly healthy a lot of gelatinous debris. There is no palpable bone. We've been using gentamicin ointment and Hydrofera Blue. Since the patient was last here he was seen by Dr. Algis Liming of infectious disease. He ordered an MRI that I think is due later this month on 4/30. He apparently also  checked a sedimentation rate and C-reactive protein metabolic panel CBC with differential. At the time he was seen he wanted to withhold any microbials pending identification of the depth of the infection although looking at this I don't know that I'm going to be able to hold off for another 10 days. 4/29; he went on to have the MRI that was ordered by Dr. Algis Liming of infectious disease. 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 I had given him cefdinir last week for surrounding cellulitis he is finishing this today. I have communicated with Dr. Algis Liming who wants bone for pathology and culture before proceeding with consideration of IV antibiotics. Fortunately he is due to see Dr. Samuella Cota of podiatry today I am hopeful that the bone biopsy and culture will be considered there 5/4 as I understand things Dr. Samuella Cota actually wants to remove the diseased proximal fifth metatarsal. Looking back at this decision is probably not a bad one. He has good blood flow he is nonambulatory they are apparently going to do this next week. Paradoxically the wound on the lateral part of his foot is actually a lot better. The cellulitis that was so problematic 2 weeks ago also has resolved. READMISSION 03/23/2020 T is now a 61 year old man he has cerebral palsy and is functionally paraplegic. The last time I saw him he had a diseased proximal fifth metatarsal. 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 Samuella Cota. Culture of this showed Pseudomonas and he was followed by infectious disease Dr. Algis Liming treated with ciprofloxacin. 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 He had Integra placed. A CandS showed Pseudomonas again. Since then he has had Silvadene as a primary dressing, then Santyl and more recently Xeroform and then back to Santyl 3 times a  week per his attendant who comes with him. Reviewing his records I was really not prepared for the number of wounds that he currently has. This includes substantial areas on the right lateral foot, right lateral ankle, right fourth toe dorsally, right ankle anteriorly as well as the left lateral ankle and foot. His medical history is unchanged ABIs 1.2 on the right and 1.21 on the left he has not been known to have arterial insufficiency and is not a diabetic 11/5; patient I readmitted to the clinic last week. He has multiple wounds on the right lateral foot, right fourth toe and a new area on the right second toe. He has areas on the right dorsal ankle and a substantive wound over the left lateral ankle and foot. We are using silver collagen. We put him under compression last week and this seems to have helped. He has home health coming out one other time per week to change the dressing 11/12; no real improvement. Multiple bilateral foot and ankle wounds. On the left he has a substantial wound on the left lateral ankle On the right he has wounds on the dorsal second and fourth toes. Right lateral foot we have merged to wounds these are necrotic She  has an area on the right dorsal ankle and a area on the right medial malleolus with marked surrounding erythema likely cellulitis. The patient complains about pain that kept him awake all last night from wraps that were too tight. He is not known to have arterial insufficiency. 11/18; no real change. I gave him empiric Levaquin last week directed at previously cultured Pseudomonas this is not really helped. On the right he has wounds on the right lateral foot right lateral ankle right medial ankle and the dorsal second and fourth toes. Most of these do not have a nice have a healthy surface. There is erythema in his foot which may all be venous stasis. The area on the left lateral malleolus/ankle area looks better 12/3; really disappointing if anything  deterioration especially on the right lateral foot. These are necrotic. He also has areas on the right dorsal ankle right medial malleolus and the right second and fourth toes. On the left he has a large area over the left medial ankle. There is some erythema around the wound. Things are very tender I have reviewed his most recent arterial Dopplers which are actually in 2019 but he has never been felt to have arterial issues. At that time his ABI on the right was 1.16 [no TBI due to amputation] on the left his ABI was 1.20 with a great toe pressure of 0.24. Waveforms on the right and left toes were all normal he was not felt to have significant evidence of lower extremity arterial disease. ABIs in our clinic have always been normal his pulses have been palpable. He had an MRI of his right foot in April 2021 this showed a soft tissue ulceration along the lateral midfoot with underlying osteomyelitis no abscess. 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 He returned to our clinic on 03/23/2020. The same pattern of wounds that I am describing currently this was a lot worse than earlier in the year. I gave him empiric antibiotics because he had had Pseudomonas in the past this does not seem to have helped. We have not made any progress on any of these wound site. The patient is in a lot of pain 12/10; the patient comes in with a new deep tissue injury on the right heel large necrotic wound on the right lateral foot, smaller necrotic wound on the dorsal ankle and medial ankle. Necrotic wounds on the second and fourth toes. We have been using Hydrofera Blue On the left he has the area on the left little medial ankle which actually looks some better healthier looking tissue. We are using Hydrofera Blue here as well X-ray ordered last week showed no osseous erosion no plain film indication of osteomyelitis. 12/17; PCR culture I did of the right lateral foot wound last week showed a large titer of  Enterococcus faecalis and low titers of of coag negative staph and Peptostreptococcus. I have started him on Augmentin for 2 weeks but I may continue beyond this. He has continuous complaints of pain now apparently making it difficult for him to rest at night. Substantial wound area in the right foot and ankle. Unfortunately the left medial foot seems to be more substantial this week. 12/27; in general the wounds on the right foot and ankle and the left medial ankle look better. There is certainly less erythema in the right foot. I have him on Augmentin and I am going to extend this for another week. MRI is booked for 06/01/2020 06/02/2020; patient's  extensive skin breakdown on the right foot continues it is stable to perhaps slightly improved however there is a considerable amount of tissue breakdown. Unfortunately his MRI shows findings consistent with osteomyelitis in the lateral aspect of the cuboid. The overlying wound in this area is deep but less necrotic than I am used to seeing. He has area on the right posterior heel is now necrotic.. The area on the left lateral ankle/foot looks somewhat better. The patient states he continues to be in a lot of pain from the right foot and he is really tired of hurting from these wounds. I have discussed things with T before. He is anxious to be out of pain. He is nonambulatory. Furthermore the osteomyelitis in the right foot and the erry extensive wounds in this area are only a source of potential systemic infection. He seems in favor of proceeding with a below-knee amputation. I cannot argue with this we have not made a lot of progress in several months working on these wounds ever since his fifth ray amputation by Dr. Samuella Cota 1/28; in general the patient's right foot looks better although there is several wounds still present there were certainly look less angry. The area on the right lateral foot looks better he still has a necrotic area on the right Achilles  heel but this is separating the right fourth toe and the right medial malleolus. We have been using silver alginate with Bactroban on all of these wounds. On the left medial ankle the wound looks healthy we have been using Hydrofera Blue. He has an appointment I think on Monday with Dr. Victorino Dike with regards to a BKA. Although the wounds on the right foot look better than they did heat the patient still relates that he has occasional pain and he suffered with this through the years he would rather have a below-knee amputation. Provided Dr. Victorino Dike agrees with this he is anxious to proceed 2/11; the patient continues to make decent progress with the area on the left medial ankle. We are using Hydrofera Blue in this area. Although retreating the right foot wounds palliatively he continues to make improvement in the right lateral dorsal foot, right heel and the wounds on his toes. He saw Dr. Victorino Dike to also concurred with the amputation directed has underlying osteomyelitis. The patient has suffered a great deal with the pain in this foot from recurrent wounds although the wounds currently are making progress I do not think this decision is the wrong one. He is nonambulatory and has underlying osteomyelitis 2/25; the left medial ankle wound is much better. We have been using Hydrofera Blue on this home health changing the dressing Amputation with Dr. Victorino Dike on 07/27/2020. The wounds on his lateral foot are necrotic. The rest of the multiple wounds look the same. 3/18; the patient had his amputation on 07/27/2020. He was sent home the same day however he required readmission from 07/31/2020 2 through 08/03/2020 caused by community-acquired pneumonia, nausea and vomiting. He comes back in today for follow-up. The story we are getting from his caregiver is that he saw his primary doctor earlier this week and was put on Augmentin for cellulitis of the lower leg on the left. Indeed the last time we saw this patient this  was just about healed it is much larger today. He also was not put in compression which could have resulted in breakdown as well as infection 3/25; again a very different looking wound on the left medial ankle than what  we've been used to seeing. We used Hydrofera Blue here under compression however the wound is larger albeit superficial. No obvious infection Electronic Signature(s) Signed: 08/21/2020 9:50:14 AM By: Baltazar Najjar MD Entered By: Baltazar Najjar on 08/20/2020 07:33:15 -------------------------------------------------------------------------------- Physical Exam Details Patient Name: Date of Service: [name]. 08/18/2020 11:00 A M Medical Record Number: [id number] Patient Account Number: [id number] Date of Birth/Sex: Treating RN: August 31, 1960 (60 y.o. Lytle Michaels Primary Care Provider: [name] Other Clinician: Referring Provider: Treating Provider/Extender: [name], [name] in Treatment: 21 Constitutional Sitting or standing Blood Pressure is within target range for patient.. Pulse regular and within target range for patient.Marland Kitchen Respirations regular, non-labored and within target range.. Temperature is normal and within the target range for the patient.Marland Kitchen Appears in no distress. Musculoskeletal we did not locate the amputated right leg. Psychiatric appears at normal baseline. Notes exam; left medial foot and ankle. This is superficial without much depth however fairly large. Some erythema but no tenderness. He still has edema in the left leg much better than last week. This was wrapped by orthopedics I presume I did not disturb this. Electronic Signature(s) Signed: 08/21/2020 9:50:14 AM By: Baltazar Najjar MD Entered By: Baltazar Najjar on 08/20/2020 07:35:42 -------------------------------------------------------------------------------- Physician Orders Details Patient Name: Date of Service: [name], [name] 08/18/2020 11:00 A  M Medical Record Number: [id number] Patient Account Number: [id number] Date of Birth/Sex: Treating RN: Nov 19, 1960 (60 y.o. 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 Bathing/ Shower/ Hygiene May shower with protection but do not get wound dressing(s) wet. Other Bathing/Shower/Hygiene Orders/Instructions: - scrub legs gently with wash cloth and soap and water with dressing changes Edema Control - Lymphedema / SCD / Other Bilateral Lower Extremities Elevate legs to the level of the heart or above for 30 minutes daily and/or when sitting, a frequency of: Off-Loading Heel suspension boot to: - heel protectors at all times Other: - avoid any pressure to back of heels Home Health New wound care orders this week; continue Home Health for wound care. May utilize formulary equivalent dressing for wound treatment orders unless otherwise specified. - primary dressing changed-Needs visits 3x this week Other Home Health Orders/Instructions: - [facility]: Wound Treatment Wound #[id number] - Malleolus Wound Laterality: Left, Medial Cleanser: Soap and Water (Home Health) 3 x Per Day/30 Days Discharge Instructions: May shower and wash wound with dial antibacterial soap and water prior to dressing  change. Cleanser: Wound Cleanser (Home Health) 3 x Per Day/30 Days Discharge Instructions: Cleanse the wound with wound cleanser prior to applying a clean dressing using gauze sponges, not tissue or cotton balls. Peri-Wound Care: Triamcinolone 15 (g) (Home Health) 3 x Per Day/30 Days Discharge Instructions: Use triamcinolone 15 (g) as directed Peri-Wound Care: Sween Lotion (Moisturizing lotion) (Home Health) 3 x Per Day/30 Days Discharge Instructions: Apply moisturizing lotion with dressing changes Prim Dressing: Maxorb Extra Calcium Alginate Dressing, 4x4 in (Home Health) 3 x Per Day/30 Days ary Discharge Instructions:  Apply calcium alginate to wound bed as instructed Secondary Dressing: Woven Gauze Sponge, Non-Sterile 4x4 in 3 x Per Day/30 Days Discharge Instructions: Apply over primary dressing as directed. Secondary Dressing: ABD Pad, 8x10 (Home Health) 3 x Per Day/30 Days Discharge Instructions: Apply over primary dressing as directed. Compression Wrap: ThreePress (3 layer compression wrap) (Home Health) 3 x Per Day/30 Days Discharge Instructions: Apply three layer compression as directed. Patient Medications llergies: adhesive tape A Notifications Medication Indication Start End 08/18/2020 triamcinolone acetonide DOSE topical 0.1 % cream - cream topical apply lightly to entire area left ankle with dressing changes Electronic Signature(s) Signed: 08/18/2020 6:33:47 PM By: Zenaida DeedBoehlein, Linda RN, BSN Signed: 08/21/2020 9:50:14 AM By: Baltazar Najjarobson, Michael MD Previous Signature: 08/18/2020 12:14:10 PM Version By: Antonieta IbaBarnhart, Jodi Entered By: Zenaida DeedBoehlein, Linda on 08/18/2020 12:53:07 Prescription 08/18/2020 -------------------------------------------------------------------------------- Madie RenoHUMBLE, [name] Robson, Michael MD Patient Name: Provider: Jan 27, 1961 [id number] Date of Birth: NPI#: Judie PetitM [id number] Sex: DEA #: [id number] Phone #: License #[facility] Patient Address: [address] [place], [place] [address] [address] [place], [place] [address] [phone] Allergies adhesive tape Medication Medication: Route: Strength: Form: triamcinolone acetonide topical 0.1 % cream Class: TOPICAL ANTI-INFLAMMATORY STEROIDAL Dose: Frequency / Time: Indication: cream topical apply lightly to entire area left ankle with dressing changes Number of Refills: Number of Units: 0 Generic Substitution: Start Date: End Date: Administered at Facility: Substitution Permitted 08/18/2020 No Note to Pharmacy: Hand Signature: Date(s): Electronic Signature(s) Signed: 08/18/2020 6:33:47 PM By: Zenaida DeedBoehlein, Linda RN, BSN Signed: 08/21/2020 9:50:14 AM By: Baltazar Najjarobson, Michael MD Entered By: Zenaida DeedBoehlein, Linda on 08/18/2020 12:53:09 -------------------------------------------------------------------------------- Problem List Details Patient Name: Date of Service: [name] RhineHUMBLE, [name] 08/18/2020 11:00 A M Medical Record Number: [id number] Patient Account Number: [id number] Date of Birth/Sex: Treating RN: Jan 27, 1961 (59 y.o. Lytle MichaelsM) Barnhart, Jodi Primary Care Provider: Jamison OkaBakare, Mobolaji Other Clinician: Referring Provider: Treating Provider/Extender: Alethia Bertholdobson, Michael Bakare, [name] in Treatment: 21 Active Problems ICD-10 Encounter Code Description Active Date MDM Diagnosis [phone] Non-pressure chronic ulcer of left ankle with fat layer exposed 03/23/2020 No Yes I87.302 Chronic venous hypertension (idiopathic) without complications of left lower 08/20/2020 No Yes extremity Inactive Problems ICD-10 Code Description Active Date Inactive Date I89.0 Lymphedema, not elsewhere classified 03/23/2020 03/23/2020 L03.115 Cellulitis of right lower limb 04/07/2020 04/07/2020 L89.616 Pressure-induced deep tissue damage of right heel 05/05/2020 05/05/2020 L97.312 Non-pressure chronic ulcer of right ankle with fat layer exposed 03/23/2020  03/23/2020 L97.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 layer exposed 03/23/2020 03/23/2020 S90.811D Abrasion, right foot, subsequent encounter 03/23/2020 03/23/2020 T81.31XD Disruption of external operation (surgical) wound, not elsewhere classified, subsequent 03/23/2020 03/23/2020 encounter Resolved Problems Electronic Signature(s) Signed: 08/21/2020 9:50:14 AM By: Baltazar Najjarobson, Michael MD Previous Signature: 08/18/2020 12:26:47 PM Version By: Antonieta IbaBarnhart, Jodi Previous Signature: 08/18/2020 12:13:46 PM Version By: Antonieta IbaBarnhart, Jodi Entered By: Baltazar Najjarobson, Michael on 08/20/2020 07:43:18 -------------------------------------------------------------------------------- Progress Note Details Patient Name: Date of Service: [name] RhineHUMBLE, Wright L. 08/18/2020 11:00 A M Medical Record Number: [id number] Patient Account Number: [id number] Date of Birth/Sex: Treating RN: Jan 27, 1961 (60 y.o. Lytle MichaelsM) Barnhart, Jodi Primary Care Provider: Jamison OkaBakare, Mobolaji Other Clinician: Referring Provider: Treating Provider/Extender: Alethia Bertholdobson, Michael Bakare, [name] in Treatment: 21 Subjective History of Present Illness (HPI) 11/23/15; this is a patient I remember from a previous stay in this clinic in 2012. The patient says this was for a wound in this same area as currently although I have no information to verify that. Most of the information is  provided by his caregiver from the group home where he resides. Apparently he has had a wound in this area for 8 months. He also has erythema around this area and has had multiple rounds of antibiotics apparently with some improvement but the erythema is persists. He apparently had a fracture in the ankle 2 months ago and was seen by Dr. Lajoyce Corners . He apparently had a splint applied and more recently a heel offloading boot. He had home health coming out to a week ago not clear what they are dressing this with but they apparently discharged him perhaps because of one of  Dr. 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 He has a Nurse, adult for transferring at home he is nonambulatory and does not wear footwear. He is not a diabetic. Looking through [facility] he appears to have fairly active primary care. The area on the ankle is variably labeled as a pressure area and/or chronic venous insufficiency. He had a recent venous ultrasound on 5/25 that did not show a DVT in the left leg however this was not a reflux study. There was no superficial DVT either. Arterial studies on 10/15/14 showed a left ABI of 1.16 Center right of 1.12 waveforms were triphasic he does not have significant arterial disease. He has had recent x-rays of the left foot and ankle. The left foot did not show any abnormality. Left ankle x-ray showed a spiral fracture of the left tibial diaphysis without evidence of osteomyelitis. 11/30/15 culture I did last week showed pseudomonas I changed him to oral ciprofloxacin. Erythema is a lot better. 12/07/15 area around the wound shows considerable improvement of the periwound erythema. 12/14/15; the patient has a improvement of the periwound erythema which in retrospect I think with cellulitis successfully treated. We have been using Iodoflex started last week 12/25/15 wound on the left medial malleolus and dorsal left foot. Theraskin #1 applied READMISSION 02/27/16; the patient was admitted to hospital from 8/14 through 01/18/16 initially felt to have cellulitis of his left lower leg however he was noted to have significant persistent pain and swelling. A duplex ultrasound was ordered that was negative. Obie Dredge an x-ray was done that showed a spiral fracture of the left leg. With posterior displacement and angulation at the mid left tibia. Orthopedics saw the patient and splinted the leg. He was then sent to [facility] skilled facility and only return to his group home last week. He has wounds on the left medial  malleolus, left dorsal foot and a worrisome area on the plantar left heel which is not really open but may represent a hematoma or a DTI 03/06/16; areas of left anterior and medial ankle both look improved. There are 3 small wounds here. He has a area over the dorsal left first metatarsal head which is not open. In meantime his left heel has opened 03/14/16 the medial left ankle wounds looks stable to improved. The area on the posterior left heel has opened up and had a very adherent eschar and slept over this 03/21/16 patient has 3 wounds on his left medial ankle is stable area on his left dorsal first metatarsal head appears to be improved. Continued problems with adherent eschar over the left heel 03/28/16; patient has 3 wounds on his left medial ankle and lower leg all of these appear to be stable. The problem continues to be the left heel and the adherent eschar. This may have been a pressure ulcer from her brace  at one point. He has a English as a second language teacher we have been using [employer] and all wound areas. Dressings are being changed by home health 04/04/16; the patient's areas on the medial aspect of the left ankle looks superficial and appear to be gradually closing. The area on the plantar aspects/Achilles aspect of his left heel as surface slough and nonviable subcutaneous tissue requiring debridement. 04/11/16; the areas on the patient's medial left ankle appear to be superficial and appear to be closing. The area on the plantar heel on the left has surface slough and nonviable subcutaneous tissue requiring debridement. I don't think this is too much different from last week there is also undermining 04/25/16; patient is followed in his group home by [facility] home health. The area on the plantar heel is smaller but with still with some depth. We're using Iodoflex to this. The areas on the left ankle looks healthy but dry and somewhat larger. Cause of this is not really clear using Prisma 05/02/16;  areas x3 all look improved expecially the areas on the medial ankle. Heel wound is smaller but still deep 05/09/16; all areas o3 look improved on the medial ankle. Heel wound is also contracting. 12/0.9/17 the areas on his left medial ankle are all closed. I think these are largely chronic venous insufficiency however there is no edema. The area on his left heel was a pressure area. READMISSION 08/02/16; this man is a patient that we have had in this clinic on several times before. I believe he has cerebral palsy is nonambulatory. He has chronic venous insufficiency with venous inflammation. He was last year in the fall of 2017 with areas on his left medial ankle left heel and left dorsal foot. He arrives today with once again a vague history. As mentioned he is nonambulatory he spends most of his day in a pressure offloading boot nevertheless they noted wounds in this area on the medial left ankle 2 weeks ago. There is any other history here I am uncertain 08/15/16; the patient's wounds on his medial ankle actually look better however the area on the left lateral/dorsal foot is covered in black necrotic eschar. Previous formal arterial studies did not show significant arterial disease with normal ABIs and triphasic waveforms. He is immobilized and at risk for pressure although he seems aware protective boots at all times as far as I am aware. 08/22/16; in general his wounds look better. All the wounds on his medial left ankle look improved the area on his lateral foot however continues to be problematic. Patient comes in the clinic today in a new wheelchair he is quite delighted 09/05/16; a wound on the left medial Calf and left dorsal foot are healed. The area on the left lateral foot is improved however the area on the left medial malleolus is inflamed with a considerable degree of surrounding erythema. This looks like cellulitis. ABIs and clinical exam are within normal limits 09/12/16; the patient  continues to have a wound on the left medial calf. I was concerned about cellulitis in this area last week and gave him a course of doxycycline. The wound looks better here. The area on the left lateral foot however is unchanged 09/20/16; the patient came in this week early after traumatizing his toe on the left foot on a table while moving around in his motorized wheelchair. 09/26/16- he is here for follow-up evaluation of his left foot and malleolus ulcers. Since his appointment he abraded his left medial hallux against a table  while in his motorized wheelchair, apparently trying to pull up to the table for a meal. He continues to complain of tenderness to the left lateral foot, surrounding the current ulcer 10/07/16; left foot and malleolus ulcers. 10/14/16; the patient is doing well. He has a wound on the left lateral, left medial malleolus and atraumatic wound on the left great toe. Although these appear to be superficial and closing 10/31/16; the patient is doing well. The area on the left lateral foot, left medial malleolus and the left great toe or all epithelialized. He has home health 11/14/16; patient arrives today with the area on the left great toe healed. The left lateral foot however in the left medial malleolus and the ankle superficial wounds that are slightly larger. He has erythema and tenderness around the area on the left lateral foot. As well he is complaining of dysuria. His caregiver notes that when he gets like this he is more lethargic which indeed seems to be the case today 11/21/16; left great toe is still healed. His wound on the left lateral foot appears healthy and slightly smaller also the area on the left medial ankle. This had black eschar on it which I removed. The remaining wound looks satisfactory 12/12/16; patient hasn't been here in a number of weeks. The area on the left lateral foot is healed however on his medial ankle has 2 open areas one of them covered with a  necrotic eschar. He also has a new wound on the right second toe which was apparently trauma induced while he was at a movie 12/27/16; the patient has wounds on his left medial mallelus and left lateral foot that are just about closed. The area on the right second toe which was trauma from last time is still open but again everything looks a lot better here. 01/30/17; patient arrives in clinic today and is really been a long time since I've seen him. The wounds are all epithelialized on the left foot this includes his left lateral foot, left medial malleolus. Apparently was in the ER for what of I think was a fungal infection on his buttock's he was prescribed Diflucan and doxycycline I think this is already getting better. READMISSION T is a now 60 year old man who has advanced cerebral palsy and functional quadriplegia. We have had him in this clinic multiple times before for wounds erry on his right ankle, left ankle, left dorsal foot and most recently from 08/02/16 through 01/30/17 with his left medial ankle and left foot and finally right second toe. His attendant from the group home where he lives is very knowledgeable about him. She states that they noticed breakdown in his skin in his feet sometime in January although they could not get an appointment until today in this clinic. The previous wounds have always been felt to be secondary to incidental trauma/pressure areas or possibly some degree of chronic venous insufficiency. He had normal arterial studies in may of 2016. He has definite open wounds on the right lateral foot over the base of the fifth metatarsal, the right second toe DIP, the left medial malleolus. He has concerning areas over the left lateral fifth metatarsal base, retaform purpura over the medial left ankle and perhaps the medial left first toe. 07/21/17; culture we did last week from the base of the right fifth metatarsal grew group G strep. This should've been sensitive to the  doxycycline I gave him. The other major wounds are on the right second toe DIP, left  medial malleolus, base of the left lateral fifth metatarsal and the medial left ankle and medial left toe. 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 We have macrovascular arterial studies scheduled for Thursday although I don't think this is what the problem is. 07/28/17; patient arrives with really no improvement. He has a wound on the right second toe, lateral right foot, right medial malleolus which was new this week, left medial malleolus, but looked like a DTI on the left great toe. His macrovascular studies are on 07/30/17 although I would be surprised if this provides all the explanation. He has small areas even on his right second and third toe which look like splinter-type hemorrhages. The question I have is where is this coming from. I'd like to see the vascular studies however before we pursue this. He has had a DVT in the past but is no longer on anticoagulants. I'll need to review his history 08/04/17; the patient had his ARTERIAL STUDIES this showed a right ABI of 1.16 and the left ABI of 1.20. TBI on the left at 0.84. He is at a right first toe amputation. The studies were unchanged from studies in May 2016. It was not felt that he had significant lower extremity arterial disease In the meantime we are using silver alginate to the wounds on the right second toe right lateral foot left medial malleolus. I'm going to change to Silver collagen today. 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 Working him up for this would include an echocardiogram probably a CT scan of the chest and abdomen to look at the major thoracic and abdominal aortic vessels. I am not going to involve myself in this unless there is more symptoms/signs 08/18/17; the patient arrives today with wounds  looking somewhat better. The remaining areas are the right second toe dorsal, right lateral foot and left medial malleolus which only has a small open area. We've been using silver collagen 09/01/17;right second toe dorsally probes to bone today. This is a deterioration. We've been using silver collagen. Only a small open area remains on the left medial malleolus. 09/09/17; right second toe appears better. There is no probing bone. He has a small area remaining on the left medial malleolus, the right lateral foot. We have been using silver collagen 10/07/17; the patient returns to clinic today after a month hiatus. He has wounds over the right second toe, right lateral foot and the left medial malleolus. We have been using silver collagen. He has home health. 10/24/17; patient has open wounds over the right second toe, now the left fourth toe and a superficial area over his left buttock. Some of the other areas have healed including the left medial malleolus and right lateral foot. Using silver collagen on these wounds 11/07/17; the patient has closed his wounds on the right second toe and left fourth toe although they look all normal. Last time he was here he had a superficial area over the left buttock this is also closed. The right lateral foot wound I had closed out last time although it is seems to have reopened today. 628/19 the areas over the right second and left fourth toe remains closed. The right lateral foot has tightly adherent necrotic surface over. We have been using silver collagen he offloads this and a bunny boot 12/12/17; the patient has reopened the recurrent area over the right second PIP joint.  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 He does not have obvious macrovascular disease. The area over the right lateral foot looks about the same as last time tightly adherent necrotic debris. We've been using Hydrofera Blue 12/25/17; 2  week follow-up. The area over the right second PIP is closed however the area on the right lateral foot is worse with surrounding erythema and pain compatible with cellulitis is going to need an antibiotic. ooAS WELL..... We have looked at his buttocks and lower back. He has 3 stage II ulcers. The exact history here is unclear however he is very disabled man with cerebral palsy. He goes to a day program from the group home in which she lives 5 days a week and I think he is on his buttock all day on these visits. Surrounding the wounds see has extensive stage I injury as well. ooFINALLY it looks as though he has lost weight. I'm not the only staff member that is recognized this 01/08/18 on evaluation today patient appears to be doing better in regard to his gluteal wounds. In fact everything appears to be healed back here although very dry. He has a couple open sores on the scrotal region other than this it is really his foot that is still giving him the most trouble. He did complete the Keflex unfortunately it seems like there's still a lot of erythema in this in fact looks worse than it did previous. I'm gonna culture this today and likely start with a different antibiotic. 01/16/18; I was reassured that the wounds on his buttocks and sacrum are healed. I did not look at these the day. The culture that was done last week on 01/11/18 of the right lateral foot wounds showed Pseudomonas. Bactrim is not a good choice for this. Ciprofloxacin interacts with his muscle relaxants therefore I prescribed cefdinir 300 twice a day for 7 days. Bactrim coverage of pseudomonas is not reliable although the degree of erythema looks a lot better 01/30/18; the patient has completed his antibiotics. The area does not have infection on the right lateral foot. The wounds look a lot better. We've been using silver alginate 02/12/18; there is on the right lateral foot. Copious amounts of surface eschar. Also similarly over the  PIP of the right second toe. We've been using silver alginate to date 03/02/2018; the area that remains is on the right lateral foot. The right second toe is healed. 03/16/2018; the area there is original wound on the right lateral foot. Still requiring debridement. We are using Hydrofera Blue. The right second toe is still healed/closed 03/30/2018; right lateral foot there is 2 small open areas remaining. They actually look quite healthy we have been using Hydrofera Blue the right second toe remains closed 04/13/2018; right lateral foot. 1 of the 2 small open areas from last time has closed over. The other had surface slough that I removed with a #3 curette. This looks quite healthy. The right second toe remains closed. We are using Hydrofera Blue 05/01/2018; right lateral foot very superficial areas that look like they are trying to close over. He had a new threatened area on the left medial ankle just below the medial malleolus. I think these represent chronic venous insufficiency areas 05/14/2018; the right right lateral foot looks like it closed over as was predicted last time however he has some dark areas subcutaneously erythema superiorly and this is really very tender. This is either  coexistent cellulitis or perhaps a deep tissue injury. The area on the left medial ankle is superficial but has some size. We have been using Hydrofera Blue to both areas. The patient complains of pain in his right foot 06/09/2018; right lateral foot was closed over last time but there was coexistent cellulitis around this. I gave him Levaquin. He also has an area on the left medial malleolus. He has not been back to clinic in over 3-1/2 weeks. We are using Hydrofera Blue I think to the left medial malleolus and silver alginate to the right lateral foot 1/21; right lateral foot still non-viable debris over this area and the left medial malleolus. Both of these vigorously washed off with antiseptic gauze. We  have been using silver alginate. 2/4; right lateral foot this looks somewhat better. Unfortunately the area over the left medial malleolus has deteriorated. Using silver alginate in both area 2/18; both wounds arrived today covered in a lot of necrotic debris. We have been using silver alginate. Clearly not making a lot of progress. 3/3; the patient's right second toe is reopened over the PIP. I am not sure why this is happening. He may have microvascular disease. We have checked his macrovascular flow in the past and found it to be normal. He may need an x-ray. The area on the right lateral foot requires debridement. The area on the left lateral ankle is worse. We have been using PolyMem Ag 08/26/18 on evaluation today patient appears to be doing a little worse in regard to his lower extremities based on what I'm seeing what the nursing staff is telling me. His left medial ankle appears potential to be infected there is your thing on want surrounding the wound. His right second toe is also of concern at this point in my pinion. We have not done x-rays of these areas that may be a good idea to go ahead and proceed with at this point to ensure will not deal with anything such as osteomyelitis. 4/16; it is been a while since I saw this patient. He now has wounds on the right lateral foot, dorsal right second toe, substantially on the left medial malleolus and a small area on the left lateral foot. We have been using PolyMem Silver. Curlex and Coban. He has home health changing the dressing. Since last time he was here he had a culture of the left medial ankle that showed MRSA. He is completed a course/7 days of doxycycline. XRAYS of the left ankle and right foot that were ordered were negative for osteomyelitis 4/30; he arrives in clinic today with some maceration around the wound and a lot of tenderness and erythema in a circumferential area around the right lateral foot. He has wound areas on the right  lateral foot dorsal right second toe, largely over the left medial malleolus and a small area over the left lateral foot 5/7; completed his antibiotics from last time right lateral foot cellulitis is better and the area on his left knee which we discovered after he had been seen by the provider also is improved. He still has extensive wound areas over the right lateral foot, left medial malleolus, right second toe and left lateral foot although all of these look somewhat better with PolyMem 5/15. Wounds on the right lateral foot, right second toe left medial malleolus and the left lateral foot. All of these look somewhat better. We have been using PolyMem Ag 5/26; the wound on the right lateral foot is almost  closed over but he still complains of a lot of pain in this area. He has a eschared area on the right second toe but no open wound.Geoffry Paradise concerning today his original wound on the left medial malleolus looks healthy however just inferior to this almost areas of deep tissue injury and there is an area on the left lateral foot also looks like a deep tissue injury. In the past I have wondered whether he might have cholesterol emboli or some other form of a vasculopathy. I wonder whether this could have something to do with this. 6/2; right lateral foot wound is still open I gave him empiric antibiotics last week without really a lot of change she is still complaining of pain in this area. We x- rayed his feet at some point in April these were negative. It would be difficult to do an MRI. The area on his left medial malleolus looks better. Right second toe is healed. He still has what looks to be a small pressure related area on the left lateral foot but it is not open. We are using PolyMem to all wound which seems to be doing nicely 6/16; right lateral foot wound is still open. The wound looks better although there is still a lot of tenderness around it. Rather than more empiric antibiotics I  have cultured this today. We are using PolyMem Ag. On the left lateral malleolus the wound appears better. He has a new area on the left lower anterior pretibial area 6/29; 2-week follow-up. Culture I did of the right lateral foot 2 weeks ago showed MRSA. I gave him 2 weeks of Septra which he should be just about finishing now and this seems better. We got a call from home health last week to report swelling in the left leg because we were listed as his primary. We referred him back to primary care. He arrives today with intense erythema and tenderness around the 2 wounds on the left medial ankle on the left lower calf. The area on the right lateral foot looks better 7/7; I brought him back this week to follow-up on his left foot which had cellulitis last week. I gave him clindamycin which as far as I know he tolerated. The erythema is better. He has a 3 current wounds one on the right lateral foot a small area on the right lateral malleolus and the more recent area on the left anterior pretibial area. 7/21; bilateral distal lower leg and foot wounds. The area on his right lateral foot looks a lot better. He has an area on his left pretibial area distally. The area on the medial malleolus on the left which is 1 of his worst wounds has closed over. He does not appear to have any open areas in his remaining toes 8/4-Patient returns at 2 weeks, he has the remaining wound on the left lower leg the other 2 wounds have healed. Patient is continued to be treated with silver alginate with a 2 layer wrap on the left 8/18; I been following this patient for a long period for bilateral lower extremity wounds. 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 His wounds are probably pressure although he religiously wears thick bunny boots. He does not have macrovascular disease but I did formal testing on him perhaps 2 or 3 years ago. I have often wondered about either  cholesterol emboli or a vasculopathy of some sort given the distal nature of these variable  locations etc. He has 2 areas that we have been following one on the left medial lower leg and one on the right lateral foot. Both of these are mostly healed today there is some eschar over sections of both areas although I did not disturb this. 03/31/2019 on evaluation today patient appears to be doing a little bit more poorly in regard to the sacral region. He has 2 small wounds noted at this point. Fortunately there is no signs of active infection at this time. No fevers, chills, nausea, vomiting, or diarrhea. He is having some pain when he sits on his gluteal region a big part of the reason he has these wounds is likely due to the fact that his wheelchair has not been functioning properly. He does still have a couple areas of deep tissue injury on his lower extremities he does need new Prevalon offloading boots as well. 04/28/2019 on evaluation today patient appears to be doing well with regard to his wounds in particular. His ankle area has fairly small based on what I am seeing currently and his sacral region actually is still open but does not appear to be doing too poorly in my opinion. Fortunately there is no signs of active infection at this time. He was in the hospital unfortunately and this was from 04/08/2019 through 04/12/2019 secondary to persistent diarrhea, dehydration and acute kidney injury. With that being said fortunately the patient does not seem to be doing too poorly at this time which is good news. He is having pain mainly in the sacral region. 06/23/2019 on evaluation today patient appears to be doing well with regard to his sacral region which in fact is completely healed. Unfortunately he has an area of rubbing/pressure on his right lateral chest/back region. This is where it is rubbing up on his chair. Subsequently he also has an open wound on the left medial malleolus which is not very  open but starting to crack and then on the right lateral foot this is much more open at this point. Unfortunately there is no signs of significant improvement in regard to these areas and in fact there does appear to be evidence of active infection quite significantly. No fevers, chills, nausea, vomiting, or diarrhea. The patient did test positive for COVID-19 that has been greater than 14 days ago although they did not know the exact timing. That is why he was not here for his last appointment. 07/07/2019 upon evaluation today patient actually appears to be doing quite well in regard to his left ankle as well as the chest/back region which is doing great in fact appears to be healed in my opinion. He still has an issue on his lateral right foot that is open and appears to be infected but does have me more concerned. In fact I do believe the doxycycline helped in general for the cellulitis but I believe that may have been more MRSA related based on the culture that we did. What I am seeing right now has more of the blue-green drainage and may be more consistent with a Pseudomonas that I was hoping was not really causing much of an issue but I think it may be at this point on the right lateral foot. For that reason I do think treatment would be a good idea of the reason I avoided the Cipro before was the potential for QT prolongation which could obviously cause problems with the patient with results of interaction with respiratory wound. The tizanidine also had  some interactions as well. Nonetheless I think at this point that it would be a possibility for [place] to use topical gentamicin to see if this could be of benefit for the patient underneath the silver alginate dressing. 07/21/2019 upon evaluation today patient appears to be doing fairly well at this point in regard to his lower extremity on the right. He does have a wound on his right lateral foot as well as the right fifth toe and the right fourth  toe. Unfortunately he continues to experience significant issues with pressure and trauma to some degree and I think this is just due to the fact that again he is not really not able to control his legs and what is happening here. Fortunately there is no signs of active infection at this time. 08/04/2019 upon evaluation today patient actually appears to be doing quite well with regard to his toe ulcers I am very pleased in that regard. Unfortunately the foot ulcer is showing some signs of erythema his primary care provider has placed him on clindamycin at this point. Again I do believe this can be beneficial for him. With that being said I do think the patient may benefit from a light Kerlix and Coban wrap to help with some of his edema at this point. 08/18/2019 upon evaluation today patient appears to be doing still poorly in regard to his lateral foot ulcer. This is not very deep but there is some concern about the possibility of osteomyelitis. I think it would be appropriate for [place] to go ahead and have an x-ray at this time. The patient is currently on clindamycin which from his culture which was performed on 06/23/2019 it does appear that he would benefit from that in regard to the MRSA. However Pseudomonas was also identified then and the patient actually has some blue-green drainage at this point as well. I am much more concerned about the possibility of the Pseudomonas being an infection is causative agent at this point that I am the MRSA. Subsequently Cipro the patient was sensitive to but we never use that is stable use a topical gent due to the fact that unfortunately there was some interaction between the Cipro and the patient's risk for down potentially causing QT prolongation. 08/31/2019; have not seen this patient in quite a period of time. He has a substantial wound on his right lateral foot we have been using gentamicin ointment with Hydrofera Blue over the top. From what I am able to  determine he is also on antibiotics perhaps clindamycin although I have not had a chance to look over this. He also has an eschared area on the right fifth toe as well as the right second toe. 4/20; right lateral foot wound. He comes in today with intense erythema and tenderness around this wound. The wound itself has a surface on it but it doesn't look particularly healthy a lot of gelatinous debris. There is no palpable bone. We've been using gentamicin ointment and Hydrofera Blue. Since the patient was last here he was seen by Dr. Algis Liming of infectious disease. He ordered an MRI that I think is due later this month on 4/30. He apparently also checked a sedimentation rate and C-reactive protein metabolic panel CBC with differential. At the time he was seen he wanted to withhold any microbials pending identification of the depth of the infection although looking at this I don't know that I'm going to be able to hold off for another 10 days. 4/29; he went  on to have the MRI that was ordered by Dr. Algis Liming of infectious disease. 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 I had given him cefdinir last week for surrounding cellulitis he is finishing this today. I have communicated with Dr. Algis Liming who wants bone for pathology and culture before proceeding with consideration of IV antibiotics. Fortunately he is due to see Dr. Samuella Cota of podiatry today I am hopeful that the bone biopsy and culture will be considered there 5/4 as I understand things Dr. Samuella Cota actually wants to remove the diseased proximal fifth metatarsal. Looking back at this decision is probably not a bad one. He has good blood flow he is nonambulatory they are apparently going to do this next week. Paradoxically the wound on the lateral part of his foot is actually a lot better. The cellulitis that was so problematic 2 weeks ago also has resolved. READMISSION 03/23/2020 T is now a 60 year old man he  has cerebral palsy and is functionally paraplegic. The last time I saw him he had a diseased proximal fifth metatarsal. 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 Samuella Cota. Culture of this showed Pseudomonas and he was followed by infectious disease Dr. Algis Liming treated with ciprofloxacin. 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 He had Integra placed. A CandS showed Pseudomonas again. Since then he has had Silvadene as a primary dressing, then Santyl and more recently Xeroform and then back to Santyl 3 times a week per his attendant who comes with him. Reviewing his records I was really not prepared for the number of wounds that he currently has. This includes substantial areas on the right lateral foot, right lateral ankle, right fourth toe dorsally, right ankle anteriorly as well as the left lateral ankle and foot. His medical history is unchanged ABIs 1.2 on the right and 1.21 on the left he has not been known to have arterial insufficiency and is not a diabetic 11/5; patient I readmitted to the clinic last week. He has multiple wounds on the right lateral foot, right fourth toe and a new area on the right second toe. He has areas on the right dorsal ankle and a substantive wound over the left lateral ankle and foot. We are using silver collagen. We put him under compression last week and this seems to have helped. He has home health coming out one other time per week to change the dressing 11/12; no real improvement. Multiple bilateral foot and ankle wounds. ooOn the left he has a substantial wound on the left lateral ankle ooOn the right he has wounds on the dorsal second and fourth toes. ooRight lateral foot we have merged to wounds these are necrotic ooShe has an area on the right dorsal ankle and a area on the right medial malleolus with marked surrounding  erythema likely cellulitis. The patient complains about pain that kept him awake all last night from wraps that were too tight. He is not known to have arterial insufficiency. 11/18; no real change. I gave him empiric Levaquin last week directed at previously cultured Pseudomonas this is not really helped. On the right he has wounds on the right lateral foot right lateral ankle right medial ankle and the dorsal second and fourth toes. Most of these do not have a nice have a healthy surface. There is erythema  in his foot which may all be venous stasis. The area on the left lateral malleolus/ankle area looks better 12/3; really disappointing if anything deterioration especially on the right lateral foot. These are necrotic. He also has areas on the right dorsal ankle right medial malleolus and the right second and fourth toes. On the left he has a large area over the left medial ankle. There is some erythema around the wound. Things are very tender I have reviewed his most recent arterial Dopplers which are actually in 2019 but he has never been felt to have arterial issues. At that time his ABI on the right was 1.16 [no TBI due to amputation] on the left his ABI was 1.20 with a great toe pressure of 0.24. Waveforms on the right and left toes were all normal he was not felt to have significant evidence of lower extremity arterial disease. ABIs in our clinic have always been normal his pulses have been palpable. He had an MRI of his right foot in April 2021 this showed a soft tissue ulceration along the lateral midfoot with underlying osteomyelitis no abscess. 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 He returned to our clinic on 03/23/2020. The same pattern of wounds that I am describing currently this was a lot worse than earlier in the year. I gave him empiric antibiotics because he had had Pseudomonas in the past this does not seem to have helped. We have not made any progress on any of these  wound site. The patient is in a lot of pain 12/10; the patient comes in with a new deep tissue injury on the right heel large necrotic wound on the right lateral foot, smaller necrotic wound on the dorsal ankle and medial ankle. Necrotic wounds on the second and fourth toes. We have been using Hydrofera Blue On the left he has the area on the left little medial ankle which actually looks some better healthier looking tissue. We are using Hydrofera Blue here as well X-ray ordered last week showed no osseous erosion no plain film indication of osteomyelitis. 12/17; PCR culture I did of the right lateral foot wound last week showed a large titer of Enterococcus faecalis and low titers of of coag negative staph and Peptostreptococcus. I have started him on Augmentin for 2 weeks but I may continue beyond this. He has continuous complaints of pain now apparently making it difficult for him to rest at night. Substantial wound area in the right foot and ankle. Unfortunately the left medial foot seems to be more substantial this week. 12/27; in general the wounds on the right foot and ankle and the left medial ankle look better. There is certainly less erythema in the right foot. I have him on Augmentin and I am going to extend this for another week. MRI is booked for 06/01/2020 06/02/2020; patient's extensive skin breakdown on the right foot continues it is stable to perhaps slightly improved however there is a considerable amount of tissue breakdown. Unfortunately his MRI shows findings consistent with osteomyelitis in the lateral aspect of the cuboid. The overlying wound in this area is deep but less necrotic than I am used to seeing. He has area on the right posterior heel is now necrotic.. The area on the left lateral ankle/foot looks somewhat better. The patient states he continues to be in a lot of pain from the right foot and he is really tired of hurting from these wounds. I have discussed things with T  before. He is anxious to be out of pain. He is nonambulatory. Furthermore the osteomyelitis in the right foot and the erry extensive wounds in this area are only a source of potential systemic infection. He seems in favor of proceeding with a below-knee amputation. I cannot argue with this we have not made a lot of progress in several months working on these wounds ever since his fifth ray amputation by Dr. Samuella Cota 1/28; in general the patient's right foot looks better although there is several wounds still present there were certainly look less angry. The area on the right lateral foot looks better he still has a necrotic area on the right Achilles heel but this is separating the right fourth toe and the right medial malleolus. We have been using silver alginate with Bactroban on all of these wounds. On the left medial ankle the wound looks healthy we have been using Hydrofera Blue. He has an appointment I think on Monday with Dr. Victorino Dike with regards to a BKA. Although the wounds on the right foot look better than they did heat the patient still relates that he has occasional pain and he suffered with this through the years he would rather have a below-knee amputation. Provided Dr. Victorino Dike agrees with this he is anxious to proceed 2/11; the patient continues to make decent progress with the area on the left medial ankle. We are using Hydrofera Blue in this area. Although retreating the right foot wounds palliatively he continues to make improvement in the right lateral dorsal foot, right heel and the wounds on his toes. He saw Dr. Victorino Dike to also concurred with the amputation directed has underlying osteomyelitis. The patient has suffered a great deal with the pain in this foot from recurrent wounds although the wounds currently are making progress I do not think this decision is the wrong one. He is nonambulatory and has underlying osteomyelitis 2/25; the left medial ankle wound is much better. We have  been using Hydrofera Blue on this home health changing the dressing ooAmputation with Dr. Victorino Dike on 07/27/2020. The wounds on his lateral foot are necrotic. The rest of the multiple wounds look the same. 3/18; the patient had his amputation on 07/27/2020. He was sent home the same day however he required readmission from 07/31/2020 2 through 08/03/2020 caused by community-acquired pneumonia, nausea and vomiting. He comes back in today for follow-up. The story we are getting from his caregiver is that he saw his primary doctor earlier this week and was put on Augmentin for cellulitis of the lower leg on the left. Indeed the last time we saw this patient this was just about healed it is much larger today. He also was not put in compression which could have resulted in breakdown as well as infection 3/25; again a very different looking wound on the left medial ankle than what we've been used to seeing. We used Hydrofera Blue here under compression however the wound is larger albeit superficial. No obvious infection Objective Constitutional Sitting or standing Blood Pressure is within target range for patient.. Pulse regular and within target range for patient.Marland Kitchen Respirations regular, non-labored and within target range.. Temperature is normal and within the target range for the patient.Marland Kitchen Appears in no distress. Vitals Time Taken: 11:52 AM, Temperature: 98.7 F, Pulse: 74 bpm, Respiratory Rate: 17 breaths/min, Blood Pressure: 131/75 mmHg. Musculoskeletal we did not locate the amputated right leg. Psychiatric appears at normal baseline. General Notes: exam; left medial foot and ankle. This is superficial without  much depth however fairly large. Some erythema but no tenderness. He still has edema in the left leg much better than last week. This was wrapped by orthopedics I presume I did not disturb this. Integumentary (Hair, Skin) Wound #47 status is Open. Original cause of wound was Gradually Appeared. The  date acquired was: 05/22/2020. The wound has been in treatment 12 weeks. The wound is located on the Left,Medial Malleolus. The wound measures 7.5cm length x 10cm width x 0.3cm depth; 58.905cm^2 area and 17.671cm^3 volume. There is Fat Layer (Subcutaneous Tissue) exposed. There is no tunneling or undermining noted. There is a medium amount of purulent drainage noted. The wound margin is distinct with the outline attached to the wound base. There is medium (34-66%) pink granulation within the wound bed. There is a medium (34- 66%) amount of necrotic tissue within the wound bed including Adherent Slough. Assessment Active Problems ICD-10 Non-pressure chronic ulcer of left ankle with fat layer exposed Chronic venous hypertension (idiopathic) without complications of left lower extremity Procedures Wound #47 Pre-procedure diagnosis of Wound #47 is a Venous Leg Ulcer located on the Left,Medial Malleolus . There was a Three Layer Compression Therapy Procedure by Antonieta Iba, RN. Post procedure Diagnosis Wound #47: Same as Pre-Procedure Plan Follow-up Appointments: Return Appointment in 2 weeks. Bathing/ Shower/ Hygiene: May shower with protection but do not get wound dressing(s) wet. Other Bathing/Shower/Hygiene Orders/Instructions: - scrub legs gently with wash cloth and soap and water with dressing changes Edema Control - Lymphedema / SCD / Other: Elevate legs to the level of the heart or above for 30 minutes daily and/or when sitting, a frequency of: Off-Loading: Heel suspension boot to: - heel protectors at all times Other: - avoid any pressure to back of heels Home Health: New wound care orders this week; continue Home Health for wound care. May utilize formulary equivalent dressing for wound treatment orders unless otherwise specified. - primary dressing changed-Needs visits 3x this week Other Home Health Orders/Instructions: - [facility]: The following medication(s) was prescribed:  triamcinolone acetonide topical 0.1 % cream cream topical apply lightly to entire area left ankle with dressing changes starting 08/18/2020 WOUND #47: - Malleolus Wound Laterality: Left, Medial Cleanser: Soap and Water (Home Health) 3 x Per Day/30 Days Discharge Instructions: May shower and wash wound with dial antibacterial soap and water prior to dressing change. Cleanser: Wound Cleanser (Home Health) 3 x Per Day/30 Days Discharge Instructions: Cleanse the wound with wound cleanser prior to applying a clean dressing using gauze sponges, not tissue or cotton balls. Peri-Wound Care: Triamcinolone 15 (g) (Home Health) 3 x Per Day/30 Days Discharge Instructions: Use triamcinolone 15 (g) as directed Peri-Wound Care: Sween Lotion (Moisturizing lotion) (Home Health) 3 x Per Day/30 Days Discharge Instructions: Apply moisturizing lotion with dressing changes Prim Dressing: Maxorb Extra Calcium Alginate Dressing, 4x4 in (Home Health) 3 x Per Day/30 Days ary Discharge Instructions: Apply calcium alginate to wound bed as instructed Secondary Dressing: Woven Gauze Sponge, Non-Sterile 4x4 in 3 x Per Day/30 Days Discharge Instructions: Apply over primary dressing as directed. Secondary Dressing: ABD Pad, 8x10 (Home Health) 3 x Per Day/30 Days Discharge Instructions: Apply over primary dressing as directed. Com pression Wrap: ThreePress (3 layer compression wrap) (Home Health) 3 x Per Day/30 Days Discharge Instructions: Apply three layer compression as directed. #1 we used liberal triamcinolone around the wound site itself 2I used calcium alginate and set of Hydrofera Blue hopefully more absorptive and dry #3 3 layer compression control the swelling. 4 he  has home health change the dressing nevertheless like to see this next week Electronic Signature(s) Signed: 08/20/2020 7:43:47 AM By: Baltazar Najjar MD Entered By: Baltazar Najjar on 08/20/2020  07:43:47 -------------------------------------------------------------------------------- SuperBill Details Patient Name: Date of Service: [name], [name] 08/18/2020 Medical Record Number: [id number] Patient Account Number: [id number] Date of Birth/Sex: Treating RN: [birth year] (59 y.o. 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 I87.302 Chronic venous hypertension (idiopathic) without complications of left lower extremity Facility Procedures CPT4 Code: [id number] Description: (Facility Use Only) [phone] - APPLY MULTLAY COMPRS LWR RT LEG Modifier: Quantity: 1 Physician Procedures : CPT4 Code Description Modifier [id number] - WC PHYS LEVEL 3 - EST PT ICD-10 Diagnosis Description L97.322 Non-pressure chronic ulcer of left ankle with fat layer exposed I87.302 Chronic venous hypertension (idiopathic) without complications of left  lower extremity Quantity: 1 Electronic Signature(s) Signed: 08/21/2020 9:50:14 AM By: Baltazar Najjar MD Previous Signature: 08/18/2020 6:16:52 PM Version By: Antonieta Iba Entered By: Baltazar Najjar on 08/20/2020 07:44:33

## 2020-08-23 NOTE — Progress Notes (Signed)
DANGER, MORSCH ([id number]) Visit Report for 08/18/2020 Arrival Information Details Patient Name: Date of Service: [name], [name] 08/18/2020 11:00 A M Medical Record Number: [id number] Patient Account Number: [id number] Date of Birth/Sex: Treating RN: 01/09/1961 (60 y.o. [name], [name] Primary Care Legend Pecore: [name] Other Clinician: Referring Sahmir Weatherbee: Treating Maria Gallicchio/Extender: [name], [name] in Treatment: 21 Visit Information History Since Last Visit Added or deleted any medications: No Patient Arrived: Wheel Chair Any new allergies or adverse reactions: No Arrival Time: 11:52 Had a fall or experienced change in No Accompanied By: cg activities of daily living that may affect Transfer Assistance: None risk of falls: Patient Identification Verified: Yes Signs or symptoms of abuse/neglect since last visito No Secondary Verification Process Completed: Yes Hospitalized since last visit: No Patient Requires Transmission-Based Precautions: No Implantable device outside of the clinic excluding No Patient Has Alerts: No cellular tissue based products placed in the center since last visit: Has Dressing in Place as Prescribed: Yes Pain Present Now: No Electronic Signature(s) Signed: 08/18/2020 2:16:37 PM By: [name] RN Entered By: [name] on 08/18/2020 11:52:50 -------------------------------------------------------------------------------- Compression Therapy Details Patient Name: Date of Service: [name], [name] 08/18/2020 11:00 A M Medical Record Number: [id number] Patient Account Number: [id number] Date of Birth/Sex: Treating RN: Sep 27, 1960 (60 y.o. Lytle Michaels Primary Care Gratia Disla: [name] Other Clinician: Referring Enio Hornback: Treating Zyren Sevigny/Extender: [name], [name] in Treatment: 21 Compression Therapy Performed for Wound Assessment: Wound #47 Left,Medial  Malleolus Performed By: Clinician Antonieta Iba, RN Compression Type: Three Layer Post Procedure Diagnosis Same as Pre-procedure Electronic Signature(s) Signed: 08/18/2020 6:16:52 PM By: Antonieta Iba Entered By: Antonieta Iba on 08/18/2020 12:30:02 -------------------------------------------------------------------------------- Encounter Discharge Information Details Patient Name: Date of Service: [name]. 08/18/2020 11:00 A M Medical Record Number: [id number] Patient Account Number: [id number] Date of Birth/Sex: Treating RN: 09/02/1960 (60 y.o. [name] Primary Care Elza Sortor: [name] Other Clinician: Referring Kamarie Palma: Treating Kiano Terrien/Extender: [name], [name] in Treatment: 21 Encounter Discharge Information Items Discharge Condition: Stable Ambulatory Status: Wheelchair Discharge Destination: Home Transportation: Private Auto Accompanied By: caregiver Schedule Follow-up Appointment: Yes Clinical Summary of Care: Patient Declined Electronic Signature(s) Signed: 08/18/2020 6:33:47 PM By: Zenaida Deed RN, BSN Entered By: Zenaida Deed on 08/18/2020 12:54:05 -------------------------------------------------------------------------------- Lower Extremity Assessment Details Patient Name: Date of Service: [name] 08/18/2020 11:00 A M Medical Record Number: [id number] Patient Account Number: [id number] Date of Birth/Sex: Treating RN: 1961/04/03 (60 y.o. [name], [name] Primary Care Orena Cavazos: [name] Other Clinician: Referring Mekiah Wahler: Treating Justinn Welter/Extender: [name], [name] in Treatment: 21 Edema Assessment Assessed: Kyra Searles: Yes] [Right: No] Edema: [Left: Ye] [Right: s] Calf Left: Right: Point of Measurement: From Medial Instep 29 cm Ankle Left: Right: Point of Measurement: From Medial Instep 21 cm Vascular Assessment Pulses: Dorsalis Pedis Palpable:  [Left:Yes] Posterior Tibial Palpable: [Left:Yes] Electronic Signature(s) Signed: 08/18/2020 2:16:37 PM By: [name] RN Entered By: [name] on 08/18/2020 12:00:05 -------------------------------------------------------------------------------- Multi Wound Chart Details Patient Name: Date of Service: [name]. 08/18/2020 11:00 A M Medical Record Number: [id number] Patient Account Number: [id number] Date of Birth/Sex: Treating RN: 1960/11/01 (60 y.o. Lytle Michaels Primary Care Klayton Monie: [name] Other Clinician: Referring Sayyid Harewood: Treating Doroteo Nickolson/Extender: [name], [name] in Treatment: 21 Vital Signs Height(in): Pulse(bpm): 74 Weight(lbs): Blood Pressure(mmHg): 131/75 Body Mass Index(BMI): Temperature(F): 98.7 Respiratory Rate(breaths/min): 17 Photos: [N/A:N/A] Left, Medial Malleolus N/A N/A Wound Location: Gradually Appeared N/A N/A Wounding Event: Venous Leg Ulcer N/A N/A Primary  Etiology: Anemia, Deep Vein Thrombosis, N/A N/A Comorbid History: Colitis, Osteomyelitis, Neuropathy, Quadriplegia, Confinement Anxiety 05/22/2020 N/A N/A Date Acquired: 12 N/A N/A Weeks of Treatment: Open N/A N/A Wound Status: 7.5x10x0.3 N/A N/A Measurements L x W x D (cm) 58.905 N/A N/A A (cm) : rea 17.671 N/A N/A Volume (cm) : -665.30% N/A N/A % Reduction in Area: -2194.90% N/A N/A % Reduction in Volume: Full Thickness Without Exposed N/A N/A Classification: Support Structures Medium N/A N/A Exudate Amount: Purulent N/A N/A Exudate Type: yellow, brown, green N/A N/A Exudate Color: Distinct, outline attached N/A N/A Wound Margin: Medium (34-66%) N/A N/A Granulation Amount: Pink N/A N/A Granulation Quality: Medium (34-66%) N/A N/A Necrotic Amount: Fat Layer (Subcutaneous Tissue): Yes N/A N/A Exposed Structures: Fascia: No Tendon: No Muscle: No Joint: No Bone: No None N/A  N/A Epithelialization: Compression Therapy N/A N/A Procedures Performed: Treatment Notes Wound #47 (Malleolus) Wound Laterality: Left, Medial Cleanser Soap and Water Discharge Instruction: May shower and wash wound with dial antibacterial soap and water prior to dressing change. Wound Cleanser Discharge Instruction: Cleanse the wound with wound cleanser prior to applying a clean dressing using gauze sponges, not tissue or cotton balls. Peri-Wound Care Triamcinolone 15 (g) Discharge Instruction: Use triamcinolone 15 (g) as directed Sween Lotion (Moisturizing lotion) Discharge Instruction: Apply moisturizing lotion with dressing changes Topical Primary Dressing Maxorb Extra Calcium Alginate Dressing, 4x4 in Discharge Instruction: Apply calcium alginate to wound bed as instructed Secondary Dressing Woven Gauze Sponge, Non-Sterile 4x4 in Discharge Instruction: Apply over primary dressing as directed. ABD Pad, 8x10 Discharge Instruction: Apply over primary dressing as directed. Secured With Compression Wrap ThreePress (3 layer compression wrap) Discharge Instruction: Apply three layer compression as directed. Compression Stockings Add-Ons Electronic Signature(s) Signed: 08/21/2020 9:50:14 AM By: Baltazar Najjar MD Signed: 08/21/2020 5:20:59 PM By: Antonieta Iba Entered By: Baltazar Najjar on 08/20/2020 07:31:41 -------------------------------------------------------------------------------- Multi-Disciplinary Care Plan Details Patient Name: Date of Service: MANNIE, LASO 08/18/2020 11:00 A M Medical Record Number: [id number] Patient Account Number: [id number] Date of Birth/Sex: Treating RN: 18-Dec-1960 (59 y.o. Lytle Michaels Primary Care Randell Teare: [name] Other Clinician: Referring Keiran Sias: Treating Lacresha Fusilier/Extender: [name], [name] in Treatment: 21 Active Inactive Wound/Skin Impairment Nursing Diagnoses: Knowledge deficit related  to ulceration/compromised skin integrity Goals: Patient/caregiver will verbalize understanding of skin care regimen Date Initiated: 03/23/2020 Target Resolution Date: 09/08/2020 Goal Status: Active Ulcer/skin breakdown will have a volume reduction of 30% by week 4 Date Initiated: 03/31/2020 Date Inactivated: 04/28/2020 Target Resolution Date: 04/21/2020 Goal Status: Unmet Unmet Reason: infection, Ulcer/skin breakdown will have a volume reduction of 50% by week 8 Date Initiated: 04/28/2020 Date Inactivated: 05/22/2020 Target Resolution Date: 05/19/2020 Goal Status: Unmet Unmet Reason: eschar, infection Ulcer/skin breakdown will have a volume reduction of 80% by week 12 Date Initiated: 05/22/2020 Target Resolution Date: 09/08/2020 Goal Status: Active Interventions: Assess patient/caregiver ability to perform ulcer/skin care regimen upon admission and as needed Provide education on ulcer and skin care Treatment Activities: Skin care regimen initiated : 03/23/2020 Topical wound management initiated : 03/23/2020 Notes: 08/11/20: Wound larger, patient had infection and was recently hospitalized for pneumonia. Electronic Signature(s) Signed: 08/18/2020 12:14:26 PM By: Antonieta Iba Entered By: Antonieta Iba on 08/18/2020 12:14:25 -------------------------------------------------------------------------------- Pain Assessment Details Patient Name: Date of Service: LEVAR, KNIERIM 08/18/2020 11:00 A M Medical Record Number: [id number] Patient Account Number: [id number] Date of Birth/Sex: Treating RN: 07-18-1960 (60 y.o. [name] Primary Care Breann Losano: [name] Other Clinician: Referring Ashok Sawaya: Treating Niana Martorana/Extender: [name], Mobolaji Tania Ade  in Treatment: 21 Active Problems Location of Pain Severity and Description of Pain Patient Has Paino No Site Locations Pain Management and Medication Current Pain Management: Electronic  Signature(s) Signed: 08/18/2020 2:16:37 PM By: [name] RN Entered By: [name] on 08/18/2020 11:56:52 -------------------------------------------------------------------------------- Patient/Caregiver Education Details Patient Name: Date of Service: Supple, Berish L. 3/25/2022andnbsp11:00 A M Medical Record Number: [id number] Patient Account Number: [id number] Date of Birth/Gender: Treating RN: 01-19-1961 (60 y.o. Lytle Michaels Primary Care Physician: [name] Other Clinician: Referring Physician: Treating Physician/Extender: Lamarr Lulas in Treatment: 21 Education Assessment Education Provided To: Patient Education Topics Provided Venous: Methods: Explain/Verbal, Printed Responses: State content correctly Wound/Skin Impairment: Methods: Demonstration, Explain/Verbal, Printed Responses: State content correctly Electronic Signature(s) Signed: 08/18/2020 6:16:52 PM By: Antonieta Iba Entered By: Antonieta Iba on 08/18/2020 12:25:54 -------------------------------------------------------------------------------- Wound Assessment Details Patient Name: Date of Service: RENNICK, AWADALLAH 08/18/2020 11:00 A M Medical Record Number: [id number] Patient Account Number: [id number] Date of Birth/Sex: Treating RN: 01-05-1961 (60 y.o. [name], [name] Primary Care Guage Efferson: [name] Other Clinician: Referring Breslin Hemann: Treating Aariona Momon/Extender: [name], [name] in Treatment: 21 Wound Status Wound Number: 47 Primary Venous Leg Ulcer Etiology: Wound Location: Left, Medial Malleolus Wound Open Wounding Event: Gradually Appeared Status: Date Acquired: 05/22/2020 Comorbid Anemia, Deep Vein Thrombosis, Colitis, Osteomyelitis, Weeks Of Treatment: 12 History: Neuropathy, Quadriplegia, Confinement Anxiety Clustered Wound: No Photos Wound Measurements Length: (cm) 7.5 Width: (cm) 10 Depth:  (cm) 0.3 Area: (cm) 58.905 Volume: (cm) 17.671 % Reduction in Area: -665.3% % Reduction in Volume: -2194.9% Epithelialization: None Tunneling: No Undermining: No Wound Description Classification: Full Thickness Without Exposed Support Structures Wound Margin: Distinct, outline attached Exudate Amount: Medium Exudate Type: Purulent Exudate Color: yellow, brown, green Foul Odor After Cleansing: No Slough/Fibrino Yes Wound Bed Granulation Amount: Medium (34-66%) Exposed Structure Granulation Quality: Pink Fascia Exposed: No Necrotic Amount: Medium (34-66%) Fat Layer (Subcutaneous Tissue) Exposed: Yes Necrotic Quality: Adherent Slough Tendon Exposed: No Muscle Exposed: No Joint Exposed: No Bone Exposed: No Treatment Notes Wound #47 (Malleolus) Wound Laterality: Left, Medial Cleanser Soap and Water Discharge Instruction: May shower and wash wound with dial antibacterial soap and water prior to dressing change. Wound Cleanser Discharge Instruction: Cleanse the wound with wound cleanser prior to applying a clean dressing using gauze sponges, not tissue or cotton balls. Peri-Wound Care Triamcinolone 15 (g) Discharge Instruction: Use triamcinolone 15 (g) as directed Sween Lotion (Moisturizing lotion) Discharge Instruction: Apply moisturizing lotion with dressing changes Topical Primary Dressing Maxorb Extra Calcium Alginate Dressing, 4x4 in Discharge Instruction: Apply calcium alginate to wound bed as instructed Secondary Dressing Woven Gauze Sponge, Non-Sterile 4x4 in Discharge Instruction: Apply over primary dressing as directed. ABD Pad, 8x10 Discharge Instruction: Apply over primary dressing as directed. Secured With Compression Wrap ThreePress (3 layer compression wrap) Discharge Instruction: Apply three layer compression as directed. Compression Stockings Add-Ons Electronic Signature(s) Signed: 08/21/2020 5:14:32 PM By: [name] RN Signed: 08/23/2020  9:05:34 AM By: Karl Ito Previous Signature: 08/18/2020 2:16:37 PM Version By: [name] RN Entered By: Karl Ito on 08/18/2020 17:25:58 -------------------------------------------------------------------------------- Vitals Details Patient Name: Date of Service: Fein, Virgel Gess. 08/18/2020 11:00 A M Medical Record Number: [id number] Patient Account Number: [id number] Date of Birth/Sex: Treating RN: [birth year] (59 y.o. [name] Primary Care Juelz Claar: [name] Other Clinician: Referring Suhaila Troiano: Treating Safia Panzer/Extender: [name], [name] in Treatment: 21 Vital Signs Time Taken: 11:52 Temperature (F): 98.7 Pulse (bpm): 74 Respiratory Rate (breaths/min): 17 Blood Pressure (mmHg): 131/75 Reference Range:  80 - 120 mg / dl Electronic Signature(s) Signed: 08/18/2020 2:16:37 PM By: [name] RN Entered By: [name] on 08/18/2020 11:56:39

## 2020-08-31 ENCOUNTER — Other Ambulatory Visit: Payer: Self-pay

## 2020-08-31 ENCOUNTER — Encounter (HOSPITAL_BASED_OUTPATIENT_CLINIC_OR_DEPARTMENT_OTHER): Payer: Medicare Other | Attending: Internal Medicine | Admitting: Internal Medicine

## 2020-08-31 DIAGNOSIS — I872 Venous insufficiency (chronic) (peripheral): Secondary | ICD-10-CM | POA: Diagnosis not present

## 2020-08-31 DIAGNOSIS — Z7901 Long term (current) use of anticoagulants: Secondary | ICD-10-CM | POA: Insufficient documentation

## 2020-08-31 DIAGNOSIS — I890 Lymphedema, not elsewhere classified: Secondary | ICD-10-CM | POA: Insufficient documentation

## 2020-08-31 DIAGNOSIS — Z8616 Personal history of COVID-19: Secondary | ICD-10-CM | POA: Diagnosis not present

## 2020-08-31 DIAGNOSIS — L97322 Non-pressure chronic ulcer of left ankle with fat layer exposed: Secondary | ICD-10-CM | POA: Insufficient documentation

## 2020-08-31 DIAGNOSIS — R532 Functional quadriplegia: Secondary | ICD-10-CM | POA: Diagnosis not present

## 2020-08-31 DIAGNOSIS — Z86718 Personal history of other venous thrombosis and embolism: Secondary | ICD-10-CM | POA: Diagnosis not present

## 2020-08-31 DIAGNOSIS — L97522 Non-pressure chronic ulcer of other part of left foot with fat layer exposed: Secondary | ICD-10-CM | POA: Diagnosis present

## 2020-08-31 DIAGNOSIS — G809 Cerebral palsy, unspecified: Secondary | ICD-10-CM | POA: Insufficient documentation

## 2020-09-01 NOTE — Progress Notes (Signed)
**Note [name]-Identified via Obfuscation** [name], [name] ([id number]) Visit Report for 08/31/2020 Arrival Information Details Patient Name: Date of Service: [name], [name] 08/31/2020 10:45 A M Medical Record Number: [id number] Patient Account Number: [id number] Date of Birth/Sex: Treating RN: [birth year] (60 y.o. [name] Primary Care Bannon Giammarco: [name] Other Clinician: Referring Jatavious Peppard: Treating Miguel Medal/Extender: [name], [name] in Treatment: 23 Visit Information History Since Last Visit Added or deleted any medications: No Patient Arrived: Wheel Chair Any new allergies or adverse reactions: No Arrival Time: 11:13 Had a fall or experienced change in No Accompanied By: Caregiver activities of daily living that may affect Transfer Assistance: None risk of falls: Patient Identification Verified: Yes Signs or symptoms of abuse/neglect since last visito No Secondary Verification Process Completed: Yes Hospitalized since last visit: No Patient Requires Transmission-Based Precautions: No Implantable device outside of the clinic excluding No Patient Has Alerts: No cellular tissue based products placed in the center since last visit: Has Dressing in Place as Prescribed: Yes Has Compression in Place as Prescribed: Yes Pain Present Now: No Electronic Signature(s) Signed: 08/31/2020 5:35:17 PM By: Antonieta Iba Entered By: Antonieta Iba on 08/31/2020 11:14:06 -------------------------------------------------------------------------------- Compression Therapy Details Patient Name: Date of Service: [name], [name] 08/31/2020 10:45 A M Medical Record Number: [id number] Patient Account Number: [id number] Date of Birth/Sex: Treating RN: 1960-09-08 (60 y.o. [name] Primary Care Martavis Gurney: [name] Other Clinician: Referring Fynlee Rowlands: Treating Raejean Swinford/Extender: [name], [name] in Treatment: 23 Compression Therapy Performed for Wound Assessment:  Wound #47 Left,Medial Malleolus Performed By: Clinician Zenaida Deed, RN Compression Type: Three Layer Post Procedure Diagnosis Same as Pre-procedure Electronic Signature(s) Signed: 09/01/2020 4:53:51 PM By: Shawn Stall Entered By: Shawn Stall on 08/31/2020 11:43:37 -------------------------------------------------------------------------------- Encounter Discharge Information Details Patient Name: Date of Service: [name], [name] 08/31/2020 10:45 A M Medical Record Number: [id number] Patient Account Number: [id number] Date of Birth/Sex: Treating RN: 04/18/1961 (60 y.o. [name] Primary Care Marshell Rieger: [name] Other Clinician: Referring Jyren Cerasoli: Treating Everlena Mackley/Extender: [name], [name] in Treatment: 23 Encounter Discharge Information Items Discharge Condition: Stable Ambulatory Status: Wheelchair Discharge Destination: Home Transportation: Private Auto Accompanied By: caregiver Schedule Follow-up Appointment: Yes Clinical Summary of Care: Patient Declined Electronic Signature(s) Signed: 08/31/2020 5:18:58 PM By: Zenaida Deed RN, BSN Entered By: Zenaida Deed on 08/31/2020 12:02:51 -------------------------------------------------------------------------------- Lower Extremity Assessment Details Patient Name: Date of Service: [name], [name] 08/31/2020 10:45 A M Medical Record Number: [id number] Patient Account Number: [id number] Date of Birth/Sex: Treating RN: [birth year] (61 y.o. [name] Primary Care Arlicia Paquette: [name] Other Clinician: Referring Jamyla Ard: Treating Nour Rodrigues/Extender: [name], [name] in Treatment: 23 Edema Assessment Assessed: [Left: Yes] [Right: No] Edema: [Left: Ye] [Right: s] Calf Left: Right: Point of Measurement: 28 cm From Medial Instep 19 cm Ankle Left: Right: Point of Measurement: 10 cm From Medial Instep 16 cm Vascular  Assessment Pulses: Dorsalis Pedis Palpable: [Left:Yes] Electronic Signature(s) Signed: 08/31/2020 5:35:17 PM By: Antonieta Iba Entered By: Antonieta Iba on 08/31/2020 11:22:02 -------------------------------------------------------------------------------- Multi Wound Chart Details Patient Name: Date of Service: [name] 08/31/2020 10:45 A M Medical Record Number: [id number] Patient Account Number: [id number] Date of Birth/Sex: Treating RN: Nov 14, 1960 (60 y.o. [name] Primary Care Yena Tisby: [name] Other Clinician: Referring Milarose Savich: Treating Ioanna Colquhoun/Extender: [name], [name] in Treatment: 23 Vital Signs Height(in): Pulse(bpm): 80 Weight(lbs): Blood Pressure(mmHg): 129/88 Body Mass Index(BMI): Temperature(F): 98.7 Respiratory Rate(breaths/min): 16 Photos: [47:No Photos Left, Medial Malleolus] [N/A:N/A N/A] Wound Location: [47:Gradually Appeared] [N/A:N/A] Wounding Event: [47:Venous  Leg Ulcer] [N/A:N/A] Primary Etiology: [47:Anemia, Deep Vein Thrombosis,] [N/A:N/A] Comorbid History: [47:Colitis, Osteomyelitis, Neuropathy, Quadriplegia, Confinement Anxiety 05/22/2020] [N/A:N/A] Date Acquired: [47:14] [N/A:N/A] Weeks of Treatment: [47:Open] [N/A:N/A] Wound Status: [47:0.5x0.5x0.1] [N/A:N/A] Measurements L x W x D (cm) [47:0.196] [N/A:N/A] A (cm) : rea [47:0.02] [N/A:N/A] Volume (cm) : [47:97.50%] [N/A:N/A] % Reduction in Area: [47:97.40%] [N/A:N/A] % Reduction in Volume: [47:Full Thickness Without Exposed] [N/A:N/A] Classification: [47:Support Structures Medium] [N/A:N/A] Exudate Amount: [47:Serosanguineous] [N/A:N/A] Exudate Type: [47:red, brown] [N/A:N/A] Exudate Color: [47:Distinct, outline attached] [N/A:N/A] Wound Margin: [47:Large (67-100%)] [N/A:N/A] Granulation Amount: [47:Red, Pink] [N/A:N/A] Granulation Quality: [47:None Present (0%)] [N/A:N/A] Necrotic Amount: [47:Fat Layer (Subcutaneous Tissue): Yes  N/A] Exposed Structures: [47:Fascia: No Tendon: No Muscle: No Joint: No Bone: No Large (67-100%)] [N/A:N/A] Epithelialization: [47:Compression Therapy] [N/A:N/A] Treatment Notes Wound #47 (Malleolus) Wound Laterality: Left, Medial Cleanser Soap and Water Discharge Instruction: May shower and wash wound with dial antibacterial soap and water prior to dressing change. Wound Cleanser Discharge Instruction: Cleanse the wound with wound cleanser prior to applying a clean dressing using gauze sponges, not tissue or cotton balls. Peri-Wound Care Triamcinolone 15 (g) Discharge Instruction: Apply to left leg. Use triamcinolone 15 (g) as directed Sween Lotion (Moisturizing lotion) Discharge Instruction: Apply moisturizing lotion with dressing changes Topical Primary Dressing Maxorb Extra Calcium Alginate Dressing, 4x4 in Discharge Instruction: Apply calcium alginate to wound bed as instructed Secondary Dressing Woven Gauze Sponge, Non-Sterile 4x4 in Discharge Instruction: Apply over primary dressing as directed. Secured With Compression Wrap ThreePress (3 layer compression wrap) Discharge Instruction: Apply three layer compression as directed. Compression Stockings Add-Ons Electronic Signature(s) Signed: 08/31/2020 5:22:04 PM By: Baltazar Najjar MD Signed: 09/01/2020 4:53:51 PM By: Shawn Stall Entered By: Baltazar Najjar on 08/31/2020 12:10:02 -------------------------------------------------------------------------------- Multi-Disciplinary Care Plan Details Patient Name: Date of Service: [name], [name] 08/31/2020 10:45 A M Medical Record Number: [id number] Patient Account Number: [id number] Date of Birth/Sex: Treating RN: 05-07-1961 (59 y.o. [name] Primary Care Nevah Dalal: [name] Other Clinician: Referring Lylian Sanagustin: Treating Prinston Kynard/Extender: [name], [name] in Treatment: 23 Active Inactive Wound/Skin Impairment Nursing  Diagnoses: Knowledge deficit related to ulceration/compromised skin integrity Goals: Patient/caregiver will verbalize understanding of skin care regimen Date Initiated: 03/23/2020 Target Resolution Date: 09/22/2020 Goal Status: Active Ulcer/skin breakdown will have a volume reduction of 30% by week 4 Date Initiated: 03/31/2020 Date Inactivated: 04/28/2020 Target Resolution Date: 04/21/2020 Goal Status: Unmet Unmet Reason: infection, Ulcer/skin breakdown will have a volume reduction of 50% by week 8 Date Initiated: 04/28/2020 Date Inactivated: 05/22/2020 Target Resolution Date: 05/19/2020 Goal Status: Unmet Unmet Reason: eschar, infection Ulcer/skin breakdown will have a volume reduction of 80% by week 12 Date Initiated: 05/22/2020 Target Resolution Date: 09/22/2020 Goal Status: Active Interventions: Assess patient/caregiver ability to perform ulcer/skin care regimen upon admission and as needed Provide education on ulcer and skin care Treatment Activities: Skin care regimen initiated : 03/23/2020 Topical wound management initiated : 03/23/2020 Notes: 08/11/20: Wound larger, patient had infection and was recently hospitalized for pneumonia. Electronic Signature(s) Signed: 09/01/2020 4:53:51 PM By: Shawn Stall Entered By: Shawn Stall on 08/31/2020 11:41:52 -------------------------------------------------------------------------------- Pain Assessment Details Patient Name: Date of Service: [name], [name] 08/31/2020 10:45 A M Medical Record Number: [id number] Patient Account Number: [id number] Date of Birth/Sex: Treating RN: May 15, 1961 (60 y.o. [name] Primary Care Wilma Wuthrich: [name] Other Clinician: Referring Kimiya Brunelle: Treating Verdella Laidlaw/Extender: [name], [name] in Treatment: 23 Active Problems Location of Pain Severity and Description of Pain Patient Has Paino No Site Locations Pain Management and Medication  Current Pain  Management: Electronic Signature(s) Signed: 08/31/2020 5:35:17 PM By: Antonieta Iba Entered By: Antonieta Iba on 08/31/2020 11:14:58 -------------------------------------------------------------------------------- Patient/Caregiver Education Details Patient Name: Date of Service: [name] 4/7/2022andnbsp10:45 A M Medical Record Number: [id number] Patient Account Number: [id number] Date of Birth/Gender: Treating RN: December 28, 1960 (60 y.o. [name] Primary Care Physician: [name] Other Clinician: Referring Physician: Treating Physician/Extender: Lamarr Lulas in Treatment: 23 Education Assessment Education Provided To: Patient Education Topics Provided Wound/Skin Impairment: Handouts: Skin Care Do's and Dont's Methods: Explain/Verbal Responses: Reinforcements needed Electronic Signature(s) Signed: 09/01/2020 4:53:51 PM By: Shawn Stall Entered By: Shawn Stall on 08/31/2020 11:42:06 -------------------------------------------------------------------------------- Wound Assessment Details Patient Name: Date of Service: [name], [name] 08/31/2020 10:45 A M Medical Record Number: [id number] Patient Account Number: [id number] Date of Birth/Sex: Treating RN: 1961-05-22 (60 y.o. [name] Primary Care Aaliah Jorgenson: [name] Other Clinician: Referring Hania Cerone: Treating Kavari Parrillo/Extender: [name], [name] in Treatment: 23 Wound Status Wound Number: 47 Primary Venous Leg Ulcer Etiology: Wound Location: Left, Medial Malleolus Wound Open Wounding Event: Gradually Appeared Status: Date Acquired: 05/22/2020 Comorbid Anemia, Deep Vein Thrombosis, Colitis, Osteomyelitis, Weeks Of Treatment: 14 History: Neuropathy, Quadriplegia, Confinement Anxiety Clustered Wound: No Photos Wound Measurements Length: (cm) 0.5 Width: (cm) 0.5 Depth: (cm) 0.1 Area: (cm) 0.196 Volume: (cm) 0.02 % Reduction in  Area: 97.5% % Reduction in Volume: 97.4% Epithelialization: Large (67-100%) Tunneling: No Undermining: No Wound Description Classification: Full Thickness Without Exposed Support Structures Wound Margin: Distinct, outline attached Exudate Amount: Medium Exudate Type: Serosanguineous Exudate Color: red, brown Foul Odor After Cleansing: No Slough/Fibrino No Wound Bed Granulation Amount: Large (67-100%) Exposed Structure Granulation Quality: Red, Pink Fascia Exposed: No Necrotic Amount: None Present (0%) Fat Layer (Subcutaneous Tissue) Exposed: Yes Tendon Exposed: No Muscle Exposed: No Joint Exposed: No Bone Exposed: No Treatment Notes Wound #47 (Malleolus) Wound Laterality: Left, Medial Cleanser Soap and Water Discharge Instruction: May shower and wash wound with dial antibacterial soap and water prior to dressing change. Wound Cleanser Discharge Instruction: Cleanse the wound with wound cleanser prior to applying a clean dressing using gauze sponges, not tissue or cotton balls. Peri-Wound Care Triamcinolone 15 (g) Discharge Instruction: Apply to left leg. Use triamcinolone 15 (g) as directed Sween Lotion (Moisturizing lotion) Discharge Instruction: Apply moisturizing lotion with dressing changes Topical Primary Dressing Maxorb Extra Calcium Alginate Dressing, 4x4 in Discharge Instruction: Apply calcium alginate to wound bed as instructed Secondary Dressing Woven Gauze Sponge, Non-Sterile 4x4 in Discharge Instruction: Apply over primary dressing as directed. Secured With Compression Wrap ThreePress (3 layer compression wrap) Discharge Instruction: Apply three layer compression as directed. Compression Stockings Add-Ons Electronic Signature(s) Signed: 08/31/2020 4:58:23 PM By: Karl Ito Signed: 09/01/2020 4:53:51 PM By: Shawn Stall Entered By: Karl Ito on 08/31/2020  16:54:43 -------------------------------------------------------------------------------- Vitals Details Patient Name: Date of Service: [name], [name] Gess. 08/31/2020 10:45 A M Medical Record Number: [id number] Patient Account Number: [id number] Date of Birth/Sex: Treating RN: 20-Jun-1960 (59 y.o. [name] Primary Care Rosella Crandell: [name] Other Clinician: Referring Amea Mcphail: Treating Renesmae Donahey/Extender: [name], [name] in Treatment: 23 Vital Signs Time Taken: 11:14 Temperature (F): 98.7 Pulse (bpm): 80 Respiratory Rate (breaths/min): 16 Blood Pressure (mmHg): 129/88 Reference Range: 80 - 120 mg / dl Electronic Signature(s) Signed: 08/31/2020 5:35:17 PM By: Antonieta Iba Entered By: Antonieta Iba on 08/31/2020 11:14:31

## 2020-09-01 NOTE — Progress Notes (Signed)
[name] ([id number]) Visit Report for 08/31/2020 HPI Details Patient Name: Date of Service: [name], [name] 08/31/2020 10:45 A M Medical Record Number: [id number] Patient Account Number: [id number] Date of Birth/Sex: Treating RN: 31-Dec-1960 (60 y.o. [name] Primary Care Provider: Jamison [name] Other Clinician: Referring Provider: Treating Provider/Extender: [name], [name] in Treatment: 23 History of Present Illness HPI Description: 11/23/15; this is a patient I remember from a previous stay in this clinic in 2012. The patient says this was for a wound in this same area as currently although I have no information to verify that. Most of the information is provided by his caregiver from the group home where he resides. Apparently he has had a wound in this area for 8 months. He also has erythema around this area and has had multiple rounds of antibiotics apparently with some improvement but the erythema is persists. He apparently had a fracture in the ankle 2 months ago and was seen by [name] . He apparently had a splint applied and more recently a heel offloading boot. 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 He has a Nurse, adult for transferring at home he is nonambulatory and does not wear footwear. He is not a diabetic. Looking through  link he appears to have fairly active primary care. The area on the ankle is variably labeled as a pressure area and/or chronic venous insufficiency. He had a recent venous ultrasound on 5/25 that did not show a DVT in the left leg however this was not a reflux study. There was no superficial DVT either. Arterial studies on 10/15/14 showed a left ABI of 1.16 Center right of 1.12 waveforms were triphasic he does not have  significant arterial disease. He has had recent x-rays of the left foot and ankle. The left foot did not show any abnormality. Left ankle x-ray showed a spiral fracture of the left tibial diaphysis without evidence of osteomyelitis. 11/30/15 culture I did last week showed pseudomonas I changed him to oral ciprofloxacin. Erythema is a lot better. 12/07/15 area around the wound shows considerable improvement of the periwound erythema. 12/14/15; the patient has a improvement of the periwound erythema which in retrospect I think with cellulitis successfully treated. We have been using Iodoflex started last week 12/25/15 wound on the left medial malleolus and dorsal left foot. Theraskin #1 applied READMISSION 02/27/16; the patient was admitted to hospital from 8/14 through 01/18/16 initially felt to have cellulitis of his left lower leg however he was noted to have significant persistent pain and swelling. A duplex ultrasound was ordered that was negative. [name] an x-ray was done that showed a spiral fracture of the left leg. With posterior displacement and angulation at the mid left tibia. Orthopedics saw the patient and splinted the leg. He was then sent to [facility] skilled facility and only return to his group home last week. He has wounds on the left medial malleolus, left dorsal foot and a worrisome area on the plantar left heel which is not really open but may represent a hematoma or a DTI 03/06/16; areas of left anterior and medial ankle both look improved. There are 3 small wounds here. He has a area over the dorsal left first metatarsal head which is not open. In meantime his left  heel has opened 03/14/16 the medial left ankle wounds looks stable to improved. The area on the posterior left heel has opened up and had a very adherent eschar and slept over this 03/21/16 patient has 3 wounds on his left medial ankle is stable area on his left dorsal first metatarsal head appears to be improved.  Continued problems with adherent eschar over the left heel 03/28/16; patient has 3 wounds on his left medial ankle and lower leg all of these appear to be stable. The problem continues to be the left heel and the adherent eschar. This may have been a pressure ulcer from her brace at one point. He has a English as a second language teacher we have been using [employer] and all wound areas. Dressings are being changed by home health 04/04/16; the patient's areas on the medial aspect of the left ankle looks superficial and appear to be gradually closing. The area on the plantar aspects/Achilles aspect of his left heel as surface slough and nonviable subcutaneous tissue requiring debridement. 04/11/16; the areas on the patient's medial left ankle appear to be superficial and appear to be closing. The area on the plantar heel on the left has surface slough and nonviable subcutaneous tissue requiring debridement. I don't think this is too much different from last week there is also undermining 04/25/16; patient is followed in his group home by [facility] home health. The area on the plantar heel is smaller but with still with some depth. We're using Iodoflex to this. The areas on the left ankle looks healthy but dry and somewhat larger. Cause of this is not really clear using Prisma 05/02/16; areas x3 all look improved expecially the areas on the medial ankle. Heel wound is smaller but still deep 05/09/16; all areas 3 look improved on the medial ankle. Heel wound is also contracting. 12/0.9/17 the areas on his left medial ankle are all closed. I think these are largely chronic venous insufficiency however there is no edema. The area on his left heel was a pressure area. READMISSION 08/02/16; this man is a patient that we have had in this clinic on several times before. I believe he has cerebral palsy is nonambulatory. He has chronic venous insufficiency with venous inflammation. He was last year in the fall of 2017 with areas on his  left medial ankle left heel and left dorsal foot. He arrives today with once again a vague history. As mentioned he is nonambulatory he spends most of his day in a pressure offloading boot nevertheless they noted wounds in this area on the medial left ankle 2 [name] ago. There is any other history here I am uncertain 08/15/16; the patient's wounds on his medial ankle actually look better however the area on the left lateral/dorsal foot is covered in black necrotic eschar. Previous formal arterial studies did not show significant arterial disease with normal ABIs and triphasic waveforms. He is immobilized and at risk for pressure although he seems aware protective boots at all times as far as I am aware. 08/22/16; in general his wounds look better. All the wounds on his medial left ankle look improved the area on his lateral foot however continues to be problematic. Patient comes in the clinic today in a new wheelchair he is quite delighted 09/05/16; a wound on the left medial Calf and left dorsal foot are healed. The area on the left lateral foot is improved however the area on the left medial malleolus is inflamed with a considerable degree of surrounding erythema. This  looks like cellulitis. ABIs and clinical exam are within normal limits 09/12/16; the patient continues to have a wound on the left medial calf. I was concerned about cellulitis in this area last week and gave him a course of doxycycline. The wound looks better here. The area on the left lateral foot however is unchanged 09/20/16; the patient came in this week early after traumatizing his toe on the left foot on a table while moving around in his motorized wheelchair. 09/26/16- he is here for follow-up evaluation of his left foot and malleolus ulcers. Since his appointment he abraded his left medial hallux against a table while in his motorized wheelchair, apparently trying to pull up to the table for a meal. He continues to complain of  tenderness to the left lateral foot, surrounding the current ulcer 10/07/16; left foot and malleolus ulcers. 10/14/16; the patient is doing well. He has a wound on the left lateral, left medial malleolus and atraumatic wound on the left great toe. Although these appear to be superficial and closing 10/31/16; the patient is doing well. The area on the left lateral foot, left medial malleolus and the left great toe or all epithelialized. He has home health 11/14/16; patient arrives today with the area on the left great toe healed. The left lateral foot however in the left medial malleolus and the ankle superficial wounds that are slightly larger. He has erythema and tenderness around the area on the left lateral foot. As well he is complaining of dysuria. His caregiver notes that when he gets like this he is more lethargic which indeed seems to be the case today 11/21/16; left great toe is still healed. His wound on the left lateral foot appears healthy and slightly smaller also the area on the left medial ankle. This had black eschar on it which I removed. The remaining wound looks satisfactory 12/12/16; patient hasn't been here in a number of [name]. The area on the left lateral foot is healed however on his medial ankle has 2 open areas one of them covered with a necrotic eschar. He also has a new wound on the right second toe which was apparently trauma induced while he was at a movie 12/27/16; the patient has wounds on his left medial mallelus and left lateral foot that are just about closed. The area on the right second toe which was trauma from last time is still open but again everything looks a lot better here. 01/30/17; patient arrives in clinic today and is really been a long time since I've seen him. The wounds are all epithelialized on the left foot this includes his left lateral foot, left medial malleolus. Apparently was in the ER for what of I think was a fungal infection on his buttock's he was  prescribed Diflucan and doxycycline I think this is already getting better. READMISSION T is a now 60 year old man who has advanced cerebral palsy and functional quadriplegia. We have had him in this clinic multiple times before for wounds erry on his right ankle, left ankle, left dorsal foot and most recently from 08/02/16 through 01/30/17 with his left medial ankle and left foot and finally right second toe. His attendant from the group home where he lives is very knowledgeable about him. She states that they noticed breakdown in his skin in his feet sometime in January although they could not get an appointment until today in this clinic. The previous wounds have always been felt to be secondary to incidental trauma/pressure areas  or possibly some degree of chronic venous insufficiency. He had normal arterial studies in may of 2016. He has definite open wounds on the right lateral foot over the base of the fifth metatarsal, the right second toe DIP, the left medial malleolus. He has concerning areas over the left lateral fifth metatarsal base, retaform purpura over the medial left ankle and perhaps the medial left first toe. 07/21/17; culture we did last week from the base of the right fifth metatarsal grew group G strep. This should've been sensitive to the doxycycline I gave him. The other major wounds are on the right second toe DIP, left medial malleolus, base of the left lateral fifth metatarsal and the medial left ankle and medial left toe. 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 We have macrovascular arterial studies scheduled for Thursday although I don't think this is what the problem is. 07/28/17; patient arrives with really no improvement. He has a wound on the right second toe, lateral right foot, right medial malleolus which was new this week, left medial malleolus, but looked like a DTI on the left great toe. His macrovascular studies are on 07/30/17 although I would be  surprised if this provides all the explanation. He has small areas even on his right second and third toe which look like splinter-type hemorrhages. The question I have is where is this coming from. I'd like to see the vascular studies however before we pursue this. He has had a DVT in the past but is no longer on anticoagulants. I'll need to review his history 08/04/17; the patient had his ARTERIAL STUDIES this showed a right ABI of 1.16 and the left ABI of 1.20. TBI on the left at 0.84. He is at a right first toe amputation. The studies were unchanged from studies in May 2016. It was not felt that he had significant lower extremity arterial disease In the meantime we are using silver alginate to the wounds on the right second toe right lateral foot left medial malleolus. I'm going to change to Silver collagen today. 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 Working him up for this would include an echocardiogram probably a CT scan of the chest and abdomen to look at the major thoracic and abdominal aortic vessels. I am not going to involve myself in this unless there is more symptoms/signs 08/18/17; the patient arrives today with wounds looking somewhat better. The remaining areas are the right second toe dorsal, right lateral foot and left medial malleolus which only has a small open area. We've been using silver collagen 09/01/17;right second toe dorsally probes to bone today. This is a deterioration. We've been using silver collagen. Only a small open area remains on the left medial malleolus. 09/09/17; right second toe appears better. There is no probing bone. He has a small area remaining on the left medial malleolus, the right lateral foot. We have been using silver collagen 10/07/17; the patient returns to clinic today after a month hiatus. He has wounds over the right  second toe, right lateral foot and the left medial malleolus. We have been using silver collagen. He has home health. 10/24/17; patient has open wounds over the right second toe, now the left fourth toe and a superficial area over his left buttock. Some of the other areas have healed including the left medial malleolus and right lateral foot. Using silver collagen on these  wounds 11/07/17; the patient has closed his wounds on the right second toe and left fourth toe although they look all normal. Last time he was here he had a superficial area over the left buttock this is also closed. The right lateral foot wound I had closed out last time although it is seems to have reopened today. 628/19 the areas over the right second and left fourth toe remains closed. The right lateral foot has tightly adherent necrotic surface over. We have been using silver collagen he offloads this and a bunny boot 12/12/17; the patient has reopened the recurrent area over the right second PIP joint. 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 He does not have obvious macrovascular disease. The area over the right lateral foot looks about the same as last time tightly adherent necrotic debris. We've been using Hydrofera Blue 12/25/17; 2 week follow-up. The area over the right second PIP is closed however the area on the right lateral foot is worse with surrounding erythema and pain compatible with cellulitis is going to need an antibiotic. AS WELL..... We have looked at his buttocks and lower back. He has 3 stage II ulcers. The exact history here is unclear however he is very disabled man with cerebral palsy. He goes to a day program from the group home in which she lives 5 days a week and I think he is on his buttock all day on these visits. Surrounding the wounds see has extensive stage I injury as well. FINALLY it looks as though he has lost weight. I'm not the only  staff member that is recognized this 01/08/18 on evaluation today patient appears to be doing better in regard to his gluteal wounds. In fact everything appears to be healed back here although very dry. He has a couple open sores on the scrotal region other than this it is really his foot that is still giving him the most trouble. He did complete the Keflex unfortunately it seems like there's still a lot of erythema in this in fact looks worse than it did previous. I'm gonna culture this today and likely start with a different antibiotic. 01/16/18; I was reassured that the wounds on his buttocks and sacrum are healed. I did not look at these the day. The culture that was done last week on 01/11/18 of the right lateral foot wounds showed Pseudomonas. Bactrim is not a good choice for this. Ciprofloxacin interacts with his muscle relaxants therefore I prescribed cefdinir 300 twice a day for 7 days. Bactrim coverage of pseudomonas is not reliable although the degree of erythema looks a lot better 01/30/18; the patient has completed his antibiotics. The area does not have infection on the right lateral foot. The wounds look a lot better. We've been using silver alginate 02/12/18; there is on the right lateral foot. Copious amounts of surface eschar. Also similarly over the PIP of the right second toe. We've been using silver alginate to date 03/02/2018; the area that remains is on the right lateral foot. The right second toe is healed. 03/16/2018; the area there is original wound on the right lateral foot. Still requiring debridement. We are using Hydrofera Blue. The right second toe is still healed/closed 03/30/2018; right lateral foot there is 2 small open areas remaining. They actually look quite healthy we have been using Hydrofera Blue the right second toe remains closed 04/13/2018; right lateral foot. 1 of the 2 small open  areas from last time has closed over. The other had surface slough that I removed  with a #3 curette. This looks quite healthy. The right second toe remains closed. We are using Hydrofera Blue 05/01/2018; right lateral foot very superficial areas that look like they are trying to close over. He had a new threatened area on the left medial ankle just below the medial malleolus. I think these represent chronic venous insufficiency areas 05/14/2018; the right right lateral foot looks like it closed over as was predicted last time however he has some dark areas subcutaneously erythema superiorly and this is really very tender. This is either coexistent cellulitis or perhaps a deep tissue injury. The area on the left medial ankle is superficial but has some size. We have been using Hydrofera Blue to both areas. The patient complains of pain in his right foot 06/09/2018; right lateral foot was closed over last time but there was coexistent cellulitis around this. I gave him Levaquin. He also has an area on the left medial malleolus. He has not been back to clinic in over 3-1/2 [name]. We are using Hydrofera Blue I think to the left medial malleolus and silver alginate to the right lateral foot 1/21; right lateral foot still non-viable debris over this area and the left medial malleolus. Both of these vigorously washed off with antiseptic gauze. We have been using silver alginate. 2/4; right lateral foot this looks somewhat better. Unfortunately the area over the left medial malleolus has deteriorated. Using silver alginate in both area 2/18; both wounds arrived today covered in a lot of necrotic debris. We have been using silver alginate. Clearly not making a lot of progress. 3/3; the patient's right second toe is reopened over the PIP. I am not sure why this is happening. He may have microvascular disease. We have checked his macrovascular flow in the past and found it to be normal. He may need an x-ray. The area on the right lateral foot requires debridement. The area on the left lateral  ankle is worse. We have been using PolyMem Ag 08/26/18 on evaluation today patient appears to be doing a little worse in regard to his lower extremities based on what I'm seeing what the nursing staff is telling me. His left medial ankle appears potential to be infected there is your thing on want surrounding the wound. His right second toe is also of concern at this point in my pinion. We have not done x-rays of these areas that may be a good idea to go ahead and proceed with at this point to ensure will not deal with anything such as osteomyelitis. 4/16; it is been a while since I saw this patient. He now has wounds on the right lateral foot, dorsal right second toe, substantially on the left medial malleolus and a small area on the left lateral foot. We have been using PolyMem Silver. Curlex and Coban. He has home health changing the dressing. Since last time he was here he had a culture of the left medial ankle that showed MRSA. He is completed a course/7 days of doxycycline. XRAYS of the left ankle and right foot that were ordered were negative for osteomyelitis 4/30; he arrives in clinic today with some maceration around the wound and a lot of tenderness and erythema in a circumferential area around the right lateral foot. He has wound areas on the right lateral foot dorsal right second toe, largely over the left medial malleolus and a small area over  the left lateral foot 5/7; completed his antibiotics from last time right lateral foot cellulitis is better and the area on his left knee which we discovered after he had been seen by the provider also is improved. He still has extensive wound areas over the right lateral foot, left medial malleolus, right second toe and left lateral foot although all of these look somewhat better with PolyMem 5/15. Wounds on the right lateral foot, right second toe left medial malleolus and the left lateral foot. All of these look somewhat better. We have been  using PolyMem Ag 5/26; the wound on the right lateral foot is almost closed over but he still complains of a lot of pain in this area. He has a eschared area on the right second toe but no open wound.. Also concerning today his original wound on the left medial malleolus looks healthy however just inferior to this almost areas of deep tissue injury and there is an area on the left lateral foot also looks like a deep tissue injury. In the past I have wondered whether he might have cholesterol emboli or some other form of a vasculopathy. I wonder whether this could have something to do with this. 6/2; right lateral foot wound is still open I gave him empiric antibiotics last week without really a lot of change she is still complaining of pain in this area. We x- rayed his feet at some point in April these were negative. It would be difficult to do an MRI. The area on his left medial malleolus looks better. Right second toe is healed. He still has what looks to be a small pressure related area on the left lateral foot but it is not open. We are using PolyMem to all wound which seems to be doing nicely 6/16; right lateral foot wound is still open. The wound looks better although there is still a lot of tenderness around it. Rather than more empiric antibiotics I have cultured this today. We are using PolyMem Ag. On the left lateral malleolus the wound appears better. He has a new area on the left lower anterior pretibial area 6/29; 2-week follow-up. Culture I did of the right lateral foot 2 [name] ago showed MRSA. I gave him 2 [name] of Septra which he should be just about finishing now and this seems better. We got a call from home health last week to report swelling in the left leg because we were listed as his primary. We referred him back to primary care. He arrives today with intense erythema and tenderness around the 2 wounds on the left medial ankle on the left lower calf. The area on the right  lateral foot looks better 7/7; I brought him back this week to follow-up on his left foot which had cellulitis last week. I gave him clindamycin which as far as I know he tolerated. The erythema is better. He has a 3 current wounds one on the right lateral foot a small area on the right lateral malleolus and the more recent area on the left anterior pretibial area. 7/21; bilateral distal lower leg and foot wounds. The area on his right lateral foot looks a lot better. He has an area on his left pretibial area distally. The area on the medial malleolus on the left which is 1 of his worst wounds has closed over. He does not appear to have any open areas in his remaining toes 8/4-Patient returns at 2 [name], he has the remaining wound on  the left lower leg the other 2 wounds have healed. Patient is continued to be treated with silver alginate with a 2 layer wrap on the left 8/18; I been following this patient for a long period for bilateral lower extremity wounds. 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 His wounds are probably pressure although he religiously wears thick bunny boots. He does not have macrovascular disease but I did formal testing on him perhaps 2 or 3 years ago. I have often wondered about either cholesterol emboli or a vasculopathy of some sort given the distal nature of these variable locations etc. He has 2 areas that we have been following one on the left medial lower leg and one on the right lateral foot. Both of these are mostly healed today there is some eschar over sections of both areas although I did not disturb this. 03/31/2019 on evaluation today patient appears to be doing a little bit more poorly in regard to the sacral region. He has 2 small wounds noted at this point. Fortunately there is no signs of active infection at this time. No fevers, chills, nausea, vomiting, or diarrhea. He is having some pain when he sits on  his gluteal region a big part of the reason he has these wounds is likely due to the fact that his wheelchair has not been functioning properly. He does still have a couple areas of deep tissue injury on his lower extremities he does need new Prevalon offloading boots as well. 04/28/2019 on evaluation today patient appears to be doing well with regard to his wounds in particular. His ankle area has fairly small based on what I am seeing currently and his sacral region actually is still open but does not appear to be doing too poorly in my opinion. Fortunately there is no signs of active infection at this time. He was in the hospital unfortunately and this was from 04/08/2019 through 04/12/2019 secondary to persistent diarrhea, dehydration and acute kidney injury. With that being said fortunately the patient does not seem to be doing too poorly at this time which is good news. He is having pain mainly in the sacral region. 06/23/2019 on evaluation today patient appears to be doing well with regard to his sacral region which in fact is completely healed. Unfortunately he has an area of rubbing/pressure on his right lateral chest/back region. This is where it is rubbing up on his chair. Subsequently he also has an open wound on the left medial malleolus which is not very open but starting to crack and then on the right lateral foot this is much more open at this point. Unfortunately there is no signs of significant improvement in regard to these areas and in fact there does appear to be evidence of active infection quite significantly. No fevers, chills, nausea, vomiting, or diarrhea. The patient did test positive for COVID-19 that has been greater than 14 days ago although they did not know the exact timing. That is why he was not here for his last appointment. 07/07/2019 upon evaluation today patient actually appears to be doing quite well in regard to his left ankle as well as the chest/back region which  is doing great in fact appears to be healed in my opinion. He still has an issue on his lateral right foot that is open and appears to be infected but does have me more concerned. In fact I do believe the doxycycline helped in general for the  cellulitis but I believe that may have been more MRSA related based on the culture that we did. What I am seeing right now has more of the blue-green drainage and may be more consistent with a Pseudomonas that I was hoping was not really causing much of an issue but I think it may be at this point on the right lateral foot. For that reason I do think treatment would be a good idea of the reason I avoided the Cipro before was the potential for QT prolongation which could obviously cause problems with the patient with results of interaction with respiratory wound. The tizanidine also had some interactions as well. Nonetheless I think at this point that it would be a possibility for [place] to use topical gentamicin to see if this could be of benefit for the patient underneath the silver alginate dressing. 07/21/2019 upon evaluation today patient appears to be doing fairly well at this point in regard to his lower extremity on the right. He does have a wound on his right lateral foot as well as the right fifth toe and the right fourth toe. Unfortunately he continues to experience significant issues with pressure and trauma to some degree and I think this is just due to the fact that again he is not really not able to control his legs and what is happening here. Fortunately there is no signs of active infection at this time. 08/04/2019 upon evaluation today patient actually appears to be doing quite well with regard to his toe ulcers I am very pleased in that regard. Unfortunately the foot ulcer is showing some signs of erythema his primary care provider has placed him on clindamycin at this point. Again I do believe this can be beneficial for him. With that being said I do  think the patient may benefit from a light Kerlix and Coban wrap to help with some of his edema at this point. 08/18/2019 upon evaluation today patient appears to be doing still poorly in regard to his lateral foot ulcer. This is not very deep but there is some concern about the possibility of osteomyelitis. I think it would be appropriate for [place] to go ahead and have an x-ray at this time. The patient is currently on clindamycin which from his culture which was performed on 06/23/2019 it does appear that he would benefit from that in regard to the MRSA. However Pseudomonas was also identified then and the patient actually has some blue-green drainage at this point as well. I am much more concerned about the possibility of the Pseudomonas being an infection is causative agent at this point that I am the MRSA. Subsequently Cipro the patient was sensitive to but we never use that is stable use a topical gent due to the fact that unfortunately there was some interaction between the Cipro and the patient's risk for down potentially causing QT prolongation. 08/31/2019; have not seen this patient in quite a period of time. He has a substantial wound on his right lateral foot we have been using gentamicin ointment with Hydrofera Blue over the top. From what I am able to determine he is also on antibiotics perhaps clindamycin although I have not had a chance to look over this. He also has an eschared area on the right fifth toe as well as the right second toe. 4/20; right lateral foot wound. He comes in today with intense erythema and tenderness around this wound. The wound itself has a surface on it but it doesn't  look particularly healthy a lot of gelatinous debris. There is no palpable bone. We've been using gentamicin ointment and Hydrofera Blue. Since the patient was last here he was seen by Dr. Algis Liming of infectious disease. He ordered an MRI that I think is due later this month on 4/30. He apparently also  checked a sedimentation rate and C-reactive protein metabolic panel CBC with differential. At the time he was seen he wanted to withhold any microbials pending identification of the depth of the infection although looking at this I don't know that I'm going to be able to hold off for another 10 days. 4/29; he went on to have the MRI that was ordered by Dr. Algis Liming of infectious disease. 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 I had given him cefdinir last week for surrounding cellulitis he is finishing this today. I have communicated with Dr. Algis Liming who wants bone for pathology and culture before proceeding with consideration of IV antibiotics. Fortunately he is due to see Dr. Samuella Cota of podiatry today I am hopeful that the bone biopsy and culture will be considered there 5/4 as I understand things Dr. Samuella Cota actually wants to remove the diseased proximal fifth metatarsal. Looking back at this decision is probably not a bad one. He has good blood flow he is nonambulatory they are apparently going to do this next week. Paradoxically the wound on the lateral part of his foot is actually a lot better. The cellulitis that was so problematic 2 [name] ago also has resolved. READMISSION 03/23/2020 T is now a 61 year old man he has cerebral palsy and is functionally paraplegic. The last time I saw him he had a diseased proximal fifth metatarsal. 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 Samuella Cota. Culture of this showed Pseudomonas and he was followed by infectious disease Dr. Algis Liming treated with ciprofloxacin. 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 He had Integra placed. A CandS showed Pseudomonas again. Since then he has had Silvadene as a primary dressing, then Santyl and more recently Xeroform and then back to Santyl 3 times a  week per his attendant who comes with him. Reviewing his records I was really not prepared for the number of wounds that he currently has. This includes substantial areas on the right lateral foot, right lateral ankle, right fourth toe dorsally, right ankle anteriorly as well as the left lateral ankle and foot. His medical history is unchanged ABIs 1.2 on the right and 1.21 on the left he has not been known to have arterial insufficiency and is not a diabetic 11/5; patient I readmitted to the clinic last week. He has multiple wounds on the right lateral foot, right fourth toe and a new area on the right second toe. He has areas on the right dorsal ankle and a substantive wound over the left lateral ankle and foot. We are using silver collagen. We put him under compression last week and this seems to have helped. He has home health coming out one other time per week to change the dressing 11/12; no real improvement. Multiple bilateral foot and ankle wounds. On the left he has a substantial wound on the left lateral ankle On the right he has wounds on the dorsal second and fourth toes. Right lateral foot we have merged to wounds these are necrotic She  has an area on the right dorsal ankle and a area on the right medial malleolus with marked surrounding erythema likely cellulitis. The patient complains about pain that kept him awake all last night from wraps that were too tight. He is not known to have arterial insufficiency. 11/18; no real change. I gave him empiric Levaquin last week directed at previously cultured Pseudomonas this is not really helped. On the right he has wounds on the right lateral foot right lateral ankle right medial ankle and the dorsal second and fourth toes. Most of these do not have a nice have a healthy surface. There is erythema in his foot which may all be venous stasis. The area on the left lateral malleolus/ankle area looks better 12/3; really disappointing if anything  deterioration especially on the right lateral foot. These are necrotic. He also has areas on the right dorsal ankle right medial malleolus and the right second and fourth toes. On the left he has a large area over the left medial ankle. There is some erythema around the wound. Things are very tender I have reviewed his most recent arterial Dopplers which are actually in 2019 but he has never been felt to have arterial issues. At that time his ABI on the right was 1.16 [no TBI due to amputation] on the left his ABI was 1.20 with a great toe pressure of 0.24. Waveforms on the right and left toes were all normal he was not felt to have significant evidence of lower extremity arterial disease. ABIs in our clinic have always been normal his pulses have been palpable. He had an MRI of his right foot in April 2021 this showed a soft tissue ulceration along the lateral midfoot with underlying osteomyelitis no abscess. 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 He returned to our clinic on 03/23/2020. The same pattern of wounds that I am describing currently this was a lot worse than earlier in the year. I gave him empiric antibiotics because he had had Pseudomonas in the past this does not seem to have helped. We have not made any progress on any of these wound site. The patient is in a lot of pain 12/10; the patient comes in with a new deep tissue injury on the right heel large necrotic wound on the right lateral foot, smaller necrotic wound on the dorsal ankle and medial ankle. Necrotic wounds on the second and fourth toes. We have been using Hydrofera Blue On the left he has the area on the left little medial ankle which actually looks some better healthier looking tissue. We are using Hydrofera Blue here as well X-ray ordered last week showed no osseous erosion no plain film indication of osteomyelitis. 12/17; PCR culture I did of the right lateral foot wound last week showed a large titer of  Enterococcus faecalis and low titers of of coag negative staph and Peptostreptococcus. I have started him on Augmentin for 2 [name] but I may continue beyond this. He has continuous complaints of pain now apparently making it difficult for him to rest at night. Substantial wound area in the right foot and ankle. Unfortunately the left medial foot seems to be more substantial this week. 12/27; in general the wounds on the right foot and ankle and the left medial ankle look better. There is certainly less erythema in the right foot. I have him on Augmentin and I am going to extend this for another week. MRI is booked for 06/01/2020 06/02/2020; patient's  extensive skin breakdown on the right foot continues it is stable to perhaps slightly improved however there is a considerable amount of tissue breakdown. Unfortunately his MRI shows findings consistent with osteomyelitis in the lateral aspect of the cuboid. The overlying wound in this area is deep but less necrotic than I am used to seeing. He has area on the right posterior heel is now necrotic.. The area on the left lateral ankle/foot looks somewhat better. The patient states he continues to be in a lot of pain from the right foot and he is really tired of hurting from these wounds. I have discussed things with T before. He is anxious to be out of pain. He is nonambulatory. Furthermore the osteomyelitis in the right foot and the erry extensive wounds in this area are only a source of potential systemic infection. He seems in favor of proceeding with a below-knee amputation. I cannot argue with this we have not made a lot of progress in several months working on these wounds ever since his fifth ray amputation by Dr. Samuella Cota 1/28; in general the patient's right foot looks better although there is several wounds still present there were certainly look less angry. The area on the right lateral foot looks better he still has a necrotic area on the right Achilles  heel but this is separating the right fourth toe and the right medial malleolus. We have been using silver alginate with Bactroban on all of these wounds. On the left medial ankle the wound looks healthy we have been using Hydrofera Blue. He has an appointment I think on Monday with Dr. Victorino Dike with regards to a BKA. Although the wounds on the right foot look better than they did heat the patient still relates that he has occasional pain and he suffered with this through the years he would rather have a below-knee amputation. Provided Dr. Victorino Dike agrees with this he is anxious to proceed 2/11; the patient continues to make decent progress with the area on the left medial ankle. We are using Hydrofera Blue in this area. Although retreating the right foot wounds palliatively he continues to make improvement in the right lateral dorsal foot, right heel and the wounds on his toes. He saw Dr. Victorino Dike to also concurred with the amputation directed has underlying osteomyelitis. The patient has suffered a great deal with the pain in this foot from recurrent wounds although the wounds currently are making progress I do not think this decision is the wrong one. He is nonambulatory and has underlying osteomyelitis 2/25; the left medial ankle wound is much better. We have been using Hydrofera Blue on this home health changing the dressing Amputation with Dr. Victorino Dike on 07/27/2020. The wounds on his lateral foot are necrotic. The rest of the multiple wounds look the same. 3/18; the patient had his amputation on 07/27/2020. He was sent home the same day however he required readmission from 07/31/2020 2 through 08/03/2020 caused by community-acquired pneumonia, nausea and vomiting. He comes back in today for follow-up. The story we are getting from his caregiver is that he saw his primary doctor earlier this week and was put on Augmentin for cellulitis of the lower leg on the left. Indeed the last time we saw this patient this  was just about healed it is much larger today. He also was not put in compression which could have resulted in breakdown as well as infection 3/25; again a very different looking wound on the left medial ankle than what  we've been used to seeing. We used Hydrofera Blue here under compression however the wound is larger albeit superficial. No obvious infection 4/7; left medial ankle area still somewhat inflamed but a lot better than last time. There is perhaps 2 tiny open areas remaining in this area this is just about closed we have been using TCA silver alginate Kerlix Coban. We were not made aware of any problems with the right stump we did not look either Electronic Signature(s) Signed: 08/31/2020 5:22:04 PM By: Baltazar Najjar MD Entered By: Baltazar Najjar on 08/31/2020 12:10:45 -------------------------------------------------------------------------------- Physical Exam Details Patient Name: Date of Service: [name], [name] 08/31/2020 10:45 A M Medical Record Number: [id number] Patient Account Number: [id number] Date of Birth/Sex: Treating RN: 1960-11-13 (60 y.o. [name] Primary Care Provider: Jamison [name] Other Clinician: Referring Provider: Treating Provider/Extender: [name], [name] in Treatment: 23 Constitutional Sitting or standing Blood Pressure is within target range for patient.. Pulse regular and within target range for patient.Marland Kitchen Respirations regular, non-labored and within target range.. Temperature is normal and within the target range for the patient.Marland Kitchen Appears in no distress. Cardiovascular Pedal pulses are palpable. Notes Wound exam; left medial foot and ankle. The erythema here is a lot better. Has 2 small minuscule open areas remain. There is no evidence of surrounding infection. His edema control is good Psychologist, [facility] and probation services) Signed: 08/31/2020 5:22:04 PM By: Baltazar Najjar MD Entered By: Baltazar Najjar on 08/31/2020  12:13:40 -------------------------------------------------------------------------------- Physician Orders Details Patient Name: Date of Service: [name], [name] 08/31/2020 10:45 A M Medical Record Number: [id number] Patient Account Number: [id number] Date of Birth/Sex: Treating RN: 1960/12/17 (60 y.o. [name] Primary Care Provider: Jamison [name] Other Clinician: Referring Provider: Treating Provider/Extender: [name], [name] in Treatment: 44 Verbal / Phone Orders: No Diagnosis Coding ICD-10 Coding Code Description L97.322 Non-pressure chronic ulcer of left ankle with fat layer exposed I87.302 Chronic venous hypertension (idiopathic) without complications of left lower extremity Follow-up Appointments Return Appointment in 2 [name]. Bathing/ Shower/ Hygiene May shower with protection but do not get wound dressing(s) wet. Other Bathing/Shower/Hygiene Orders/Instructions: - scrub legs gently with wash cloth and soap and water with dressing changes Edema Control - Lymphedema / SCD / Other Bilateral Lower Extremities Elevate legs to the level of the heart or above for 30 minutes daily and/or when sitting, a frequency of: Off-Loading Heel suspension boot to: - heel protectors at all times Other: - avoid any pressure to back of heels Additional Orders / Instructions Other: - ****Discontinue mupirocin ointment to right leg.**** Home Health No change in wound care orders this week; continue Home Health for wound care. May utilize formulary equivalent dressing for wound treatment orders unless otherwise specified. Other Home Health Orders/Instructions: - [facility]: three times a week. Wound Treatment Wound #47 - Malleolus Wound Laterality: Left, Medial Cleanser: Soap and Water (Home Health) 3 x Per Day/30 Days Discharge Instructions: May shower and wash wound with dial antibacterial soap and water prior to dressing change. Cleanser: Wound Cleanser (Home  Health) 3 x Per Day/30 Days Discharge Instructions: Cleanse the wound with wound cleanser prior to applying a clean dressing using gauze sponges, not tissue or cotton balls. Peri-Wound Care: Triamcinolone 15 (g) (Home Health) 3 x Per Day/30 Days Discharge Instructions: Apply to left leg. Use triamcinolone 15 (g) as directed Peri-Wound Care: Sween Lotion (Moisturizing lotion) (Home Health) 3 x Per Day/30 Days Discharge Instructions: Apply moisturizing lotion with dressing changes Prim Dressing: Maxorb Extra Calcium Alginate Dressing, 4x4 in (  Home Health) 3 x Per Day/30 Days ary Discharge Instructions: Apply calcium alginate to wound bed as instructed Secondary Dressing: Woven Gauze Sponge, Non-Sterile 4x4 in 3 x Per Day/30 Days Discharge Instructions: Apply over primary dressing as directed. Compression Wrap: ThreePress (3 layer compression wrap) (Home Health) 3 x Per Day/30 Days Discharge Instructions: Apply three layer compression as directed. Electronic Signature(s) Signed: 08/31/2020 5:22:04 PM By: Baltazar Najjar MD Signed: 09/01/2020 4:53:51 PM By: Shawn Stall Entered By: Shawn Stall on 08/31/2020 11:47:20 -------------------------------------------------------------------------------- Problem List Details Patient Name: Date of Service: [name], [name] 08/31/2020 10:45 A M Medical Record Number: [id number] Patient Account Number: [id number] Date of Birth/Sex: Treating RN: 1960-08-25 (59 y.o. [name] Primary Care Provider: Jamison [name] Other Clinician: Referring Provider: Treating Provider/Extender: [name], [name] in Treatment: 23 Active Problems ICD-10 Encounter Code Description Active Date MDM Diagnosis L97.322 Non-pressure chronic ulcer of left ankle with fat layer exposed 03/23/2020 No Yes I87.302 Chronic venous hypertension (idiopathic) without complications of left lower 08/20/2020 No Yes extremity Inactive Problems ICD-10 Code  Description Active Date Inactive Date I89.0 Lymphedema, not elsewhere classified 03/23/2020 03/23/2020 T81.31XD Disruption of external operation (surgical) wound, not elsewhere classified, subsequent 03/23/2020 03/23/2020 encounter S90.811D Abrasion, right foot, subsequent encounter 03/23/2020 03/23/2020 L97.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 layer exposed 03/23/2020 03/23/2020 L97.312 Non-pressure chronic ulcer of right ankle with fat layer exposed 03/23/2020 03/23/2020 L03.115 Cellulitis of right lower limb 04/07/2020 04/07/2020 L89.616 Pressure-induced deep tissue damage of right heel 05/05/2020 05/05/2020 Resolved Problems Electronic Signature(s) Signed: 08/31/2020 5:22:04 PM By: Baltazar Najjar MD Entered By: Baltazar Najjar on 08/31/2020 12:09:56 -------------------------------------------------------------------------------- Progress Note Details Patient Name: Date of Service: [name] 08/31/2020 10:45 A M Medical Record Number: [id number] Patient Account Number: [id number] Date of Birth/Sex: Treating RN: 1960/11/25 (60 y.o. [name] Primary Care Provider: Jamison [name] Other Clinician: Referring Provider: Treating Provider/Extender: [name], [name] in Treatment: 23 Subjective History of Present Illness (HPI) 11/23/15; this is a patient I remember from a previous stay in this clinic in 2012. The patient says this was for a wound in this same area as currently although I have no information to verify that. Most of the information is provided by his caregiver from the group home where he resides. Apparently he has had a wound in this area for 8 months. He also has erythema around this area and has had multiple rounds of antibiotics apparently with some improvement but the erythema is persists. He apparently had a fracture in the ankle 2 months ago and was seen by [name] . He apparently had a splint applied and  more recently a heel offloading boot. 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 He has a Nurse, adult for transferring at home he is nonambulatory and does not wear footwear. He is not a diabetic. Looking through [place] [facility] he appears to have fairly active primary care. The area on the ankle is variably labeled as a pressure area and/or chronic venous insufficiency. He had a recent venous ultrasound on 5/25 that did not show a DVT in the left leg however this was not a reflux study. There was no superficial DVT either. Arterial studies on 10/15/14 showed a left ABI of 1.16 Center right of 1.12 waveforms  were triphasic he does not have significant arterial disease. He has had recent x-rays of the left foot and ankle. The left foot did not show any abnormality. Left ankle x-ray showed a spiral fracture of the left tibial diaphysis without evidence of osteomyelitis. 11/30/15 culture I did last week showed pseudomonas I changed him to oral ciprofloxacin. Erythema is a lot better. 12/07/15 area around the wound shows considerable improvement of the periwound erythema. 12/14/15; the patient has a improvement of the periwound erythema which in retrospect I think with cellulitis successfully treated. We have been using Iodoflex started last week 12/25/15 wound on the left medial malleolus and dorsal left foot. Theraskin #1 applied READMISSION 02/27/16; the patient was admitted to hospital from 8/14 through 01/18/16 initially felt to have cellulitis of his left lower leg however he was noted to have significant persistent pain and swelling. A duplex ultrasound was ordered that was negative. [name] an x-ray was done that showed a spiral fracture of the left leg. With posterior displacement and angulation at the mid left  tibia. Orthopedics saw the patient and splinted the leg. He was then sent to [facility] skilled facility and only return to his group home last week. He has wounds on the left medial malleolus, left dorsal foot and a worrisome area on the plantar left heel which is not really open but may represent a hematoma or a DTI 03/06/16; areas of left anterior and medial ankle both look improved. There are 3 small wounds here. He has a area over the dorsal left first metatarsal head which is not open. In meantime his left heel has opened 03/14/16 the medial left ankle wounds looks stable to improved. The area on the posterior left heel has opened up and had a very adherent eschar and slept over this 03/21/16 patient has 3 wounds on his left medial ankle is stable area on his left dorsal first metatarsal head appears to be improved. Continued problems with adherent eschar over the left heel 03/28/16; patient has 3 wounds on his left medial ankle and lower leg all of these appear to be stable. The problem continues to be the left heel and the adherent eschar. This may have been a pressure ulcer from her brace at one point. He has a English as a second language teacher we have been using [employer] and all wound areas. Dressings are being changed by home health 04/04/16; the patient's areas on the medial aspect of the left ankle looks superficial and appear to be gradually closing. The area on the plantar aspects/Achilles aspect of his left heel as surface slough and nonviable subcutaneous tissue requiring debridement. 04/11/16; the areas on the patient's medial left ankle appear to be superficial and appear to be closing. The area on the plantar heel on the left has surface slough and nonviable subcutaneous tissue requiring debridement. I don't think this is too much different from last week there is also undermining 04/25/16; patient is followed in his group home by [facility] home health. The area on the plantar heel is smaller but  with still with some depth. We're using Iodoflex to this. The areas on the left ankle looks healthy but dry and somewhat larger. Cause of this is not really clear using Prisma 05/02/16; areas x3 all look improved expecially the areas on the medial ankle. Heel wound is smaller but still deep 05/09/16; all areas o3 look improved on the medial ankle. Heel wound is also contracting. 12/0.9/17 the areas on his left medial ankle are all  closed. I think these are largely chronic venous insufficiency however there is no edema. The area on his left heel was a pressure area. READMISSION 08/02/16; this man is a patient that we have had in this clinic on several times before. I believe he has cerebral palsy is nonambulatory. He has chronic venous insufficiency with venous inflammation. He was last year in the fall of 2017 with areas on his left medial ankle left heel and left dorsal foot. He arrives today with once again a vague history. As mentioned he is nonambulatory he spends most of his day in a pressure offloading boot nevertheless they noted wounds in this area on the medial left ankle 2 [name] ago. There is any other history here I am uncertain 08/15/16; the patient's wounds on his medial ankle actually look better however the area on the left lateral/dorsal foot is covered in black necrotic eschar. Previous formal arterial studies did not show significant arterial disease with normal ABIs and triphasic waveforms. He is immobilized and at risk for pressure although he seems aware protective boots at all times as far as I am aware. 08/22/16; in general his wounds look better. All the wounds on his medial left ankle look improved the area on his lateral foot however continues to be problematic. Patient comes in the clinic today in a new wheelchair he is quite delighted 09/05/16; a wound on the left medial Calf and left dorsal foot are healed. The area on the left lateral foot is improved however the area on the  left medial malleolus is inflamed with a considerable degree of surrounding erythema. This looks like cellulitis. ABIs and clinical exam are within normal limits 09/12/16; the patient continues to have a wound on the left medial calf. I was concerned about cellulitis in this area last week and gave him a course of doxycycline. The wound looks better here. The area on the left lateral foot however is unchanged 09/20/16; the patient came in this week early after traumatizing his toe on the left foot on a table while moving around in his motorized wheelchair. 09/26/16- he is here for follow-up evaluation of his left foot and malleolus ulcers. Since his appointment he abraded his left medial hallux against a table while in his motorized wheelchair, apparently trying to pull up to the table for a meal. He continues to complain of tenderness to the left lateral foot, surrounding the current ulcer 10/07/16; left foot and malleolus ulcers. 10/14/16; the patient is doing well. He has a wound on the left lateral, left medial malleolus and atraumatic wound on the left great toe. Although these appear to be superficial and closing 10/31/16; the patient is doing well. The area on the left lateral foot, left medial malleolus and the left great toe or all epithelialized. He has home health 11/14/16; patient arrives today with the area on the left great toe healed. The left lateral foot however in the left medial malleolus and the ankle superficial wounds that are slightly larger. He has erythema and tenderness around the area on the left lateral foot. As well he is complaining of dysuria. His caregiver notes that when he gets like this he is more lethargic which indeed seems to be the case today 11/21/16; left great toe is still healed. His wound on the left lateral foot appears healthy and slightly smaller also the area on the left medial ankle. This had black eschar on it which I removed. The remaining wound looks  satisfactory 12/12/16; patient  hasn't been here in a number of [name]. The area on the left lateral foot is healed however on his medial ankle has 2 open areas one of them covered with a necrotic eschar. He also has a new wound on the right second toe which was apparently trauma induced while he was at a movie 12/27/16; the patient has wounds on his left medial mallelus and left lateral foot that are just about closed. The area on the right second toe which was trauma from last time is still open but again everything looks a lot better here. 01/30/17; patient arrives in clinic today and is really been a long time since I've seen him. The wounds are all epithelialized on the left foot this includes his left lateral foot, left medial malleolus. Apparently was in the ER for what of I think was a fungal infection on his buttock's he was prescribed Diflucan and doxycycline I think this is already getting better. READMISSION T is a now 60 year old man who has advanced cerebral palsy and functional quadriplegia. We have had him in this clinic multiple times before for wounds erry on his right ankle, left ankle, left dorsal foot and most recently from 08/02/16 through 01/30/17 with his left medial ankle and left foot and finally right second toe. His attendant from the group home where he lives is very knowledgeable about him. She states that they noticed breakdown in his skin in his feet sometime in January although they could not get an appointment until today in this clinic. The previous wounds have always been felt to be secondary to incidental trauma/pressure areas or possibly some degree of chronic venous insufficiency. He had normal arterial studies in may of 2016. He has definite open wounds on the right lateral foot over the base of the fifth metatarsal, the right second toe DIP, the left medial malleolus. He has concerning areas over the left lateral fifth metatarsal base, retaform purpura over the medial  left ankle and perhaps the medial left first toe. 07/21/17; culture we did last week from the base of the right fifth metatarsal grew group G strep. This should've been sensitive to the doxycycline I gave him. The other major wounds are on the right second toe DIP, left medial malleolus, base of the left lateral fifth metatarsal and the medial left ankle and medial left toe. 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 We have macrovascular arterial studies scheduled for Thursday although I don't think this is what the problem is. 07/28/17; patient arrives with really no improvement. He has a wound on the right second toe, lateral right foot, right medial malleolus which was new this week, left medial malleolus, but looked like a DTI on the left great toe. His macrovascular studies are on 07/30/17 although I would be surprised if this provides all the explanation. He has small areas even on his right second and third toe which look like splinter-type hemorrhages. The question I have is where is this coming from. I'd like to see the vascular studies however before we pursue this. He has had a DVT in the past but is no longer on anticoagulants. I'll need to review his history 08/04/17; the patient had his ARTERIAL STUDIES this showed a right ABI of 1.16 and the left ABI of 1.20. TBI on the left at 0.84. He is at a right first toe amputation. The studies were unchanged from studies in May 2016. It was not felt that he had significant lower extremity arterial disease In  the meantime we are using silver alginate to the wounds on the right second toe right lateral foot left medial malleolus. I'm going to change to Silver collagen today. 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 Working him up for this would include an echocardiogram probably a CT scan of the chest and abdomen  to look at the major thoracic and abdominal aortic vessels. I am not going to involve myself in this unless there is more symptoms/signs 08/18/17; the patient arrives today with wounds looking somewhat better. The remaining areas are the right second toe dorsal, right lateral foot and left medial malleolus which only has a small open area. We've been using silver collagen 09/01/17;right second toe dorsally probes to bone today. This is a deterioration. We've been using silver collagen. Only a small open area remains on the left medial malleolus. 09/09/17; right second toe appears better. There is no probing bone. He has a small area remaining on the left medial malleolus, the right lateral foot. We have been using silver collagen 10/07/17; the patient returns to clinic today after a month hiatus. He has wounds over the right second toe, right lateral foot and the left medial malleolus. We have been using silver collagen. He has home health. 10/24/17; patient has open wounds over the right second toe, now the left fourth toe and a superficial area over his left buttock. Some of the other areas have healed including the left medial malleolus and right lateral foot. Using silver collagen on these wounds 11/07/17; the patient has closed his wounds on the right second toe and left fourth toe although they look all normal. Last time he was here he had a superficial area over the left buttock this is also closed. The right lateral foot wound I had closed out last time although it is seems to have reopened today. 628/19 the areas over the right second and left fourth toe remains closed. The right lateral foot has tightly adherent necrotic surface over. We have been using silver collagen he offloads this and a bunny boot 12/12/17; the patient has reopened the recurrent area over the right second PIP joint. 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 He does not  have obvious macrovascular disease. The area over the right lateral foot looks about the same as last time tightly adherent necrotic debris. We've been using Hydrofera Blue 12/25/17; 2 week follow-up. The area over the right second PIP is closed however the area on the right lateral foot is worse with surrounding erythema and pain compatible with cellulitis is going to need an antibiotic. ooAS WELL..... We have looked at his buttocks and lower back. He has 3 stage II ulcers. The exact history here is unclear however he is very disabled man with cerebral palsy. He goes to a day program from the group home in which she lives 5 days a week and I think he is on his buttock all day on these visits. Surrounding the wounds see has extensive stage I injury as well. ooFINALLY it looks as though he has lost weight. I'm not the only staff member that is recognized this 01/08/18 on evaluation today patient appears to be doing better in regard to his gluteal wounds. In fact everything appears to be healed back here although very dry. He has a couple open sores on the  scrotal region other than this it is really his foot that is still giving him the most trouble. He did complete the Keflex unfortunately it seems like there's still a lot of erythema in this in fact looks worse than it did previous. I'm gonna culture this today and likely start with a different antibiotic. 01/16/18; I was reassured that the wounds on his buttocks and sacrum are healed. I did not look at these the day. The culture that was done last week on 01/11/18 of the right lateral foot wounds showed Pseudomonas. Bactrim is not a good choice for this. Ciprofloxacin interacts with his muscle relaxants therefore I prescribed cefdinir 300 twice a day for 7 days. Bactrim coverage of pseudomonas is not reliable although the degree of erythema looks a lot better 01/30/18; the patient has completed his antibiotics. The area does not have infection on the  right lateral foot. The wounds look a lot better. We've been using silver alginate 02/12/18; there is on the right lateral foot. Copious amounts of surface eschar. Also similarly over the PIP of the right second toe. We've been using silver alginate to date 03/02/2018; the area that remains is on the right lateral foot. The right second toe is healed. 03/16/2018; the area there is original wound on the right lateral foot. Still requiring debridement. We are using Hydrofera Blue. The right second toe is still healed/closed 03/30/2018; right lateral foot there is 2 small open areas remaining. They actually look quite healthy we have been using Hydrofera Blue the right second toe remains closed 04/13/2018; right lateral foot. 1 of the 2 small open areas from last time has closed over. The other had surface slough that I removed with a #3 curette. This looks quite healthy. The right second toe remains closed. We are using Hydrofera Blue 05/01/2018; right lateral foot very superficial areas that look like they are trying to close over. He had a new threatened area on the left medial ankle just below the medial malleolus. I think these represent chronic venous insufficiency areas 05/14/2018; the right right lateral foot looks like it closed over as was predicted last time however he has some dark areas subcutaneously erythema superiorly and this is really very tender. This is either coexistent cellulitis or perhaps a deep tissue injury. The area on the left medial ankle is superficial but has some size. We have been using Hydrofera Blue to both areas. The patient complains of pain in his right foot 06/09/2018; right lateral foot was closed over last time but there was coexistent cellulitis around this. I gave him Levaquin. He also has an area on the left medial malleolus. He has not been back to clinic in over 3-1/2 [name]. We are using Hydrofera Blue I think to the left medial malleolus and silver alginate to  the right lateral foot 1/21; right lateral foot still non-viable debris over this area and the left medial malleolus. Both of these vigorously washed off with antiseptic gauze. We have been using silver alginate. 2/4; right lateral foot this looks somewhat better. Unfortunately the area over the left medial malleolus has deteriorated. Using silver alginate in both area 2/18; both wounds arrived today covered in a lot of necrotic debris. We have been using silver alginate. Clearly not making a lot of progress. 3/3; the patient's right second toe is reopened over the PIP. I am not sure why this is happening. He may have microvascular disease. We have checked his macrovascular flow in the past and found  it to be normal. He may need an x-ray. The area on the right lateral foot requires debridement. The area on the left lateral ankle is worse. We have been using PolyMem Ag 08/26/18 on evaluation today patient appears to be doing a little worse in regard to his lower extremities based on what I'm seeing what the nursing staff is telling me. His left medial ankle appears potential to be infected there is your thing on want surrounding the wound. His right second toe is also of concern at this point in my pinion. We have not done x-rays of these areas that may be a good idea to go ahead and proceed with at this point to ensure will not deal with anything such as osteomyelitis. 4/16; it is been a while since I saw this patient. He now has wounds on the right lateral foot, dorsal right second toe, substantially on the left medial malleolus and a small area on the left lateral foot. We have been using PolyMem Silver. Curlex and Coban. He has home health changing the dressing. Since last time he was here he had a culture of the left medial ankle that showed MRSA. He is completed a course/7 days of doxycycline. XRAYS of the left ankle and right foot that were ordered were negative for osteomyelitis 4/30; he  arrives in clinic today with some maceration around the wound and a lot of tenderness and erythema in a circumferential area around the right lateral foot. He has wound areas on the right lateral foot dorsal right second toe, largely over the left medial malleolus and a small area over the left lateral foot 5/7; completed his antibiotics from last time right lateral foot cellulitis is better and the area on his left knee which we discovered after he had been seen by the provider also is improved. He still has extensive wound areas over the right lateral foot, left medial malleolus, right second toe and left lateral foot although all of these look somewhat better with PolyMem 5/15. Wounds on the right lateral foot, right second toe left medial malleolus and the left lateral foot. All of these look somewhat better. We have been using PolyMem Ag 5/26; the wound on the right lateral foot is almost closed over but he still complains of a lot of pain in this area. He has a eschared area on the right second toe but no open wound.[name] concerning today his original wound on the left medial malleolus looks healthy however just inferior to this almost areas of deep tissue injury and there is an area on the left lateral foot also looks like a deep tissue injury. In the past I have wondered whether he might have cholesterol emboli or some other form of a vasculopathy. I wonder whether this could have something to do with this. 6/2; right lateral foot wound is still open I gave him empiric antibiotics last week without really a lot of change she is still complaining of pain in this area. We x- rayed his feet at some point in April these were negative. It would be difficult to do an MRI. The area on his left medial malleolus looks better. Right second toe is healed. He still has what looks to be a small pressure related area on the left lateral foot but it is not open. We are using PolyMem to all wound which  seems to be doing nicely 6/16; right lateral foot wound is still open. The wound looks better although there is  still a lot of tenderness around it. Rather than more empiric antibiotics I have cultured this today. We are using PolyMem Ag. On the left lateral malleolus the wound appears better. He has a new area on the left lower anterior pretibial area 6/29; 2-week follow-up. Culture I did of the right lateral foot 2 [name] ago showed MRSA. I gave him 2 [name] of Septra which he should be just about finishing now and this seems better. We got a call from home health last week to report swelling in the left leg because we were listed as his primary. We referred him back to primary care. He arrives today with intense erythema and tenderness around the 2 wounds on the left medial ankle on the left lower calf. The area on the right lateral foot looks better 7/7; I brought him back this week to follow-up on his left foot which had cellulitis last week. I gave him clindamycin which as far as I know he tolerated. The erythema is better. He has a 3 current wounds one on the right lateral foot a small area on the right lateral malleolus and the more recent area on the left anterior pretibial area. 7/21; bilateral distal lower leg and foot wounds. The area on his right lateral foot looks a lot better. He has an area on his left pretibial area distally. The area on the medial malleolus on the left which is 1 of his worst wounds has closed over. He does not appear to have any open areas in his remaining toes 8/4-Patient returns at 2 [name], he has the remaining wound on the left lower leg the other 2 wounds have healed. Patient is continued to be treated with silver alginate with a 2 layer wrap on the left 8/18; I been following this patient for a long period for bilateral lower extremity wounds. 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 His wounds are probably  pressure although he religiously wears thick bunny boots. He does not have macrovascular disease but I did formal testing on him perhaps 2 or 3 years ago. I have often wondered about either cholesterol emboli or a vasculopathy of some sort given the distal nature of these variable locations etc. He has 2 areas that we have been following one on the left medial lower leg and one on the right lateral foot. Both of these are mostly healed today there is some eschar over sections of both areas although I did not disturb this. 03/31/2019 on evaluation today patient appears to be doing a little bit more poorly in regard to the sacral region. He has 2 small wounds noted at this point. Fortunately there is no signs of active infection at this time. No fevers, chills, nausea, vomiting, or diarrhea. He is having some pain when he sits on his gluteal region a big part of the reason he has these wounds is likely due to the fact that his wheelchair has not been functioning properly. He does still have a couple areas of deep tissue injury on his lower extremities he does need new Prevalon offloading boots as well. 04/28/2019 on evaluation today patient appears to be doing well with regard to his wounds in particular. His ankle area has fairly small based on what I am seeing currently and his sacral region actually is still open but does not appear to be doing too poorly in my opinion. Fortunately there is no signs of active infection at this time.  He was in the hospital unfortunately and this was from 04/08/2019 through 04/12/2019 secondary to persistent diarrhea, dehydration and acute kidney injury. With that being said fortunately the patient does not seem to be doing too poorly at this time which is good news. He is having pain mainly in the sacral region. 06/23/2019 on evaluation today patient appears to be doing well with regard to his sacral region which in fact is completely healed. Unfortunately he has an  area of rubbing/pressure on his right lateral chest/back region. This is where it is rubbing up on his chair. Subsequently he also has an open wound on the left medial malleolus which is not very open but starting to crack and then on the right lateral foot this is much more open at this point. Unfortunately there is no signs of significant improvement in regard to these areas and in fact there does appear to be evidence of active infection quite significantly. No fevers, chills, nausea, vomiting, or diarrhea. The patient did test positive for COVID-19 that has been greater than 14 days ago although they did not know the exact timing. That is why he was not here for his last appointment. 07/07/2019 upon evaluation today patient actually appears to be doing quite well in regard to his left ankle as well as the chest/back region which is doing great in fact appears to be healed in my opinion. He still has an issue on his lateral right foot that is open and appears to be infected but does have me more concerned. In fact I do believe the doxycycline helped in general for the cellulitis but I believe that may have been more MRSA related based on the culture that we did. What I am seeing right now has more of the blue-green drainage and may be more consistent with a Pseudomonas that I was hoping was not really causing much of an issue but I think it may be at this point on the right lateral foot. For that reason I do think treatment would be a good idea of the reason I avoided the Cipro before was the potential for QT prolongation which could obviously cause problems with the patient with results of interaction with respiratory wound. The tizanidine also had some interactions as well. Nonetheless I think at this point that it would be a possibility for us to use topical gentamicin to see if this could be of benefit for the patient underneath the silver alginate dressing. 07/21/2019 upon evaluation today  patient appears to be doing fairly well at this point in regard to his lower extremity on the right. He does have a wound on his right lateral foot as well as the right fifth toe and the right fourth toe. Unfortunately he continues to experience significant issues with pressure and trauma to some degree and I think this is just due to the fact that again he is not really not able to control his legs and what is happening here. Fortunately there is no signs of active infection at this time. 08/04/2019 upon evaluation today patient actually appears to be doing quite well with regard to his toe ulcers I am very pleased in that regard. Unfortunately the foot ulcer is showing some signs of erythema his primary care provider has placed him on clindamycin at this point. Again I do believe this can be beneficial for him. With that being said I do think the patient may benefit from a light Kerlix and Coban wrap to help with some  of his edema at this point. 08/18/2019 upon evaluation today patient appears to be doing still poorly in regard to his lateral foot ulcer. This is not very deep but there is some concern about the possibility of osteomyelitis. I think it would be appropriate for [place] to go ahead and have an x-ray at this time. The patient is currently on clindamycin which from his culture which was performed on 06/23/2019 it does appear that he would benefit from that in regard to the MRSA. However Pseudomonas was also identified then and the patient actually has some blue-green drainage at this point as well. I am much more concerned about the possibility of the Pseudomonas being an infection is causative agent at this point that I am the MRSA. Subsequently Cipro the patient was sensitive to but we never use that is stable use a topical gent due to the fact that unfortunately there was some interaction between the Cipro and the patient's risk for down potentially causing QT prolongation. 08/31/2019; have not  seen this patient in quite a period of time. He has a substantial wound on his right lateral foot we have been using gentamicin ointment with Hydrofera Blue over the top. From what I am able to determine he is also on antibiotics perhaps clindamycin although I have not had a chance to look over this. He also has an eschared area on the right fifth toe as well as the right second toe. 4/20; right lateral foot wound. He comes in today with intense erythema and tenderness around this wound. The wound itself has a surface on it but it doesn't look particularly healthy a lot of gelatinous debris. There is no palpable bone. We've been using gentamicin ointment and Hydrofera Blue. Since the patient was last here he was seen by Dr. Algis Liming of infectious disease. He ordered an MRI that I think is due later this month on 4/30. He apparently also checked a sedimentation rate and C-reactive protein metabolic panel CBC with differential. At the time he was seen he wanted to withhold any microbials pending identification of the depth of the infection although looking at this I don't know that I'm going to be able to hold off for another 10 days. 4/29; he went on to have the MRI that was ordered by Dr. Algis Liming of infectious disease. 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 I had given him cefdinir last week for surrounding cellulitis he is finishing this today. I have communicated with Dr. Algis Liming who wants bone for pathology and culture before proceeding with consideration of IV antibiotics. Fortunately he is due to see Dr. Samuella Cota of podiatry today I am hopeful that the bone biopsy and culture will be considered there 5/4 as I understand things Dr. Samuella Cota actually wants to remove the diseased proximal fifth metatarsal. Looking back at this decision is probably not a bad one. He has good blood flow he is nonambulatory they are apparently going to do this next week. Paradoxically  the wound on the lateral part of his foot is actually a lot better. The cellulitis that was so problematic 2 [name] ago also has resolved. READMISSION 03/23/2020 T is now a 60 year old man he has cerebral palsy and is functionally paraplegic. The last time I saw him he had a diseased proximal fifth metatarsal. 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 Samuella Cota. Culture of this showed Pseudomonas and he was followed by infectious disease Dr. Algis Liming treated  with ciprofloxacin. 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 He had Integra placed. A CandS showed Pseudomonas again. Since then he has had Silvadene as a primary dressing, then Santyl and more recently Xeroform and then back to Santyl 3 times a week per his attendant who comes with him. Reviewing his records I was really not prepared for the number of wounds that he currently has. This includes substantial areas on the right lateral foot, right lateral ankle, right fourth toe dorsally, right ankle anteriorly as well as the left lateral ankle and foot. His medical history is unchanged ABIs 1.2 on the right and 1.21 on the left he has not been known to have arterial insufficiency and is not a diabetic 11/5; patient I readmitted to the clinic last week. He has multiple wounds on the right lateral foot, right fourth toe and a new area on the right second toe. He has areas on the right dorsal ankle and a substantive wound over the left lateral ankle and foot. We are using silver collagen. We put him under compression last week and this seems to have helped. He has home health coming out one other time per week to change the dressing 11/12; no real improvement. Multiple bilateral foot and ankle wounds. ooOn the left he has a substantial wound on the left lateral ankle ooOn the right he has wounds on the dorsal second  and fourth toes. ooRight lateral foot we have merged to wounds these are necrotic ooShe has an area on the right dorsal ankle and a area on the right medial malleolus with marked surrounding erythema likely cellulitis. The patient complains about pain that kept him awake all last night from wraps that were too tight. He is not known to have arterial insufficiency. 11/18; no real change. I gave him empiric Levaquin last week directed at previously cultured Pseudomonas this is not really helped. On the right he has wounds on the right lateral foot right lateral ankle right medial ankle and the dorsal second and fourth toes. Most of these do not have a nice have a healthy surface. There is erythema in his foot which may all be venous stasis. The area on the left lateral malleolus/ankle area looks better 12/3; really disappointing if anything deterioration especially on the right lateral foot. These are necrotic. He also has areas on the right dorsal ankle right medial malleolus and the right second and fourth toes. On the left he has a large area over the left medial ankle. There is some erythema around the wound. Things are very tender I have reviewed his most recent arterial Dopplers which are actually in 2019 but he has never been felt to have arterial issues. At that time his ABI on the right was 1.16 [no TBI due to amputation] on the left his ABI was 1.20 with a great toe pressure of 0.24. Waveforms on the right and left toes were all normal he was not felt to have significant evidence of lower extremity arterial disease. ABIs in our clinic have always been normal his pulses have been palpable. He had an MRI of his right foot in April 2021 this showed a soft tissue ulceration along the lateral midfoot with underlying osteomyelitis no abscess. 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 He returned to our clinic on 03/23/2020. The same pattern of wounds that I  am describing currently this was a  lot worse than earlier in the year. I gave him empiric antibiotics because he had had Pseudomonas in the past this does not seem to have helped. We have not made any progress on any of these wound site. The patient is in a lot of pain 12/10; the patient comes in with a new deep tissue injury on the right heel large necrotic wound on the right lateral foot, smaller necrotic wound on the dorsal ankle and medial ankle. Necrotic wounds on the second and fourth toes. We have been using Hydrofera Blue On the left he has the area on the left little medial ankle which actually looks some better healthier looking tissue. We are using Hydrofera Blue here as well X-ray ordered last week showed no osseous erosion no plain film indication of osteomyelitis. 12/17; PCR culture I did of the right lateral foot wound last week showed a large titer of Enterococcus faecalis and low titers of of coag negative staph and Peptostreptococcus. I have started him on Augmentin for 2 [name] but I may continue beyond this. He has continuous complaints of pain now apparently making it difficult for him to rest at night. Substantial wound area in the right foot and ankle. Unfortunately the left medial foot seems to be more substantial this week. 12/27; in general the wounds on the right foot and ankle and the left medial ankle look better. There is certainly less erythema in the right foot. I have him on Augmentin and I am going to extend this for another week. MRI is booked for 06/01/2020 06/02/2020; patient's extensive skin breakdown on the right foot continues it is stable to perhaps slightly improved however there is a considerable amount of tissue breakdown. Unfortunately his MRI shows findings consistent with osteomyelitis in the lateral aspect of the cuboid. The overlying wound in this area is deep but less necrotic than I am used to seeing. He has area on the right posterior heel is now necrotic.. The area on the left lateral  ankle/foot looks somewhat better. The patient states he continues to be in a lot of pain from the right foot and he is really tired of hurting from these wounds. I have discussed things with T before. He is anxious to be out of pain. He is nonambulatory. Furthermore the osteomyelitis in the right foot and the erry extensive wounds in this area are only a source of potential systemic infection. He seems in favor of proceeding with a below-knee amputation. I cannot argue with this we have not made a lot of progress in several months working on these wounds ever since his fifth ray amputation by Dr. Samuella Cota 1/28; in general the patient's right foot looks better although there is several wounds still present there were certainly look less angry. The area on the right lateral foot looks better he still has a necrotic area on the right Achilles heel but this is separating the right fourth toe and the right medial malleolus. We have been using silver alginate with Bactroban on all of these wounds. On the left medial ankle the wound looks healthy we have been using Hydrofera Blue. He has an appointment I think on Monday with Dr. Victorino Dike with regards to a BKA. Although the wounds on the right foot look better than they did heat the patient still relates that he has occasional pain and he suffered with this through the years he would rather have a below-knee amputation. Provided Dr. Victorino Dike agrees  with this he is anxious to proceed 2/11; the patient continues to make decent progress with the area on the left medial ankle. We are using Hydrofera Blue in this area. Although retreating the right foot wounds palliatively he continues to make improvement in the right lateral dorsal foot, right heel and the wounds on his toes. He saw Dr. Victorino Dike to also concurred with the amputation directed has underlying osteomyelitis. The patient has suffered a great deal with the pain in this foot from recurrent wounds although the  wounds currently are making progress I do not think this decision is the wrong one. He is nonambulatory and has underlying osteomyelitis 2/25; the left medial ankle wound is much better. We have been using Hydrofera Blue on this home health changing the dressing ooAmputation with Dr. Victorino Dike on 07/27/2020. The wounds on his lateral foot are necrotic. The rest of the multiple wounds look the same. 3/18; the patient had his amputation on 07/27/2020. He was sent home the same day however he required readmission from 07/31/2020 2 through 08/03/2020 caused by community-acquired pneumonia, nausea and vomiting. He comes back in today for follow-up. The story we are getting from his caregiver is that he saw his primary doctor earlier this week and was put on Augmentin for cellulitis of the lower leg on the left. Indeed the last time we saw this patient this was just about healed it is much larger today. He also was not put in compression which could have resulted in breakdown as well as infection 3/25; again a very different looking wound on the left medial ankle than what we've been used to seeing. We used Hydrofera Blue here under compression however the wound is larger albeit superficial. No obvious infection 4/7; left medial ankle area still somewhat inflamed but a lot better than last time. There is perhaps 2 tiny open areas remaining in this area this is just about closed we have been using TCA silver alginate Kerlix Coban. We were not made aware of any problems with the right stump we did not look either Objective Constitutional Sitting or standing Blood Pressure is within target range for patient.. Pulse regular and within target range for patient.Marland Kitchen Respirations regular, non-labored and within target range.. Temperature is normal and within the target range for the patient.Marland Kitchen Appears in no distress. Vitals Time Taken: 11:14 AM, Temperature: 98.7 F, Pulse: 80 bpm, Respiratory Rate: 16 breaths/min, Blood  Pressure: 129/88 mmHg. Cardiovascular Pedal pulses are palpable. General Notes: Wound exam; left medial foot and ankle. The erythema here is a lot better. Has 2 small minuscule open areas remain. There is no evidence of surrounding infection. His edema control is good Integumentary (Hair, Skin) Wound #47 status is Open. Original cause of wound was Gradually Appeared. The date acquired was: 05/22/2020. The wound has been in treatment 14 [name]. The wound is located on the Left,Medial Malleolus. The wound measures 0.5cm length x 0.5cm width x 0.1cm depth; 0.196cm^2 area and 0.02cm^3 volume. There is Fat Layer (Subcutaneous Tissue) exposed. There is no tunneling or undermining noted. There is a medium amount of serosanguineous drainage noted. The wound margin is distinct with the outline attached to the wound base. There is large (67-100%) red, pink granulation within the wound bed. There is no necrotic tissue within the wound bed. Assessment Active Problems ICD-10 Non-pressure chronic ulcer of left ankle with fat layer exposed Chronic venous hypertension (idiopathic) without complications of left lower extremity Procedures Wound #47 Pre-procedure diagnosis of Wound #47 is a  Venous Leg Ulcer located on the Left,Medial Malleolus . There was a Three Layer Compression Therapy Procedure by Zenaida Deed, RN. Post procedure Diagnosis Wound #47: Same as Pre-Procedure Plan Follow-up Appointments: Return Appointment in 2 [name]. Bathing/ Shower/ Hygiene: May shower with protection but do not get wound dressing(s) wet. Other Bathing/Shower/Hygiene Orders/Instructions: - scrub legs gently with wash cloth and soap and water with dressing changes Edema Control - Lymphedema / SCD / Other: Elevate legs to the level of the heart or above for 30 minutes daily and/or when sitting, a frequency of: Off-Loading: Heel suspension boot to: - heel protectors at all times Other: - avoid any pressure to back of  heels Additional Orders / Instructions: Other: - ****Discontinue mupirocin ointment to right leg.**** Home Health: No change in wound care orders this week; continue Home Health for wound care. May utilize formulary equivalent dressing for wound treatment orders unless otherwise specified. Other Home Health Orders/Instructions: - [facility]: three times a week. WOUND #47: - Malleolus Wound Laterality: Left, Medial Cleanser: Soap and Water (Home Health) 3 x Per Day/30 Days Discharge Instructions: May shower and wash wound with dial antibacterial soap and water prior to dressing change. Cleanser: Wound Cleanser (Home Health) 3 x Per Day/30 Days Discharge Instructions: Cleanse the wound with wound cleanser prior to applying a clean dressing using gauze sponges, not tissue or cotton balls. Peri-Wound Care: Triamcinolone 15 (g) (Home Health) 3 x Per Day/30 Days Discharge Instructions: Apply to left leg. Use triamcinolone 15 (g) as directed Peri-Wound Care: Sween Lotion (Moisturizing lotion) (Home Health) 3 x Per Day/30 Days Discharge Instructions: Apply moisturizing lotion with dressing changes Prim Dressing: Maxorb Extra Calcium Alginate Dressing, 4x4 in (Home Health) 3 x Per Day/30 Days ary Discharge Instructions: Apply calcium alginate to wound bed as instructed Secondary Dressing: Woven Gauze Sponge, Non-Sterile 4x4 in 3 x Per Day/30 Days Discharge Instructions: Apply over primary dressing as directed. Com pression Wrap: ThreePress (3 layer compression wrap) (Home Health) 3 x Per Day/30 Days Discharge Instructions: Apply three layer compression as directed. 1. Continue with calcium alginate 2. TCA to the area 3. Actually 3 layer compression. 4. I suspect he will be healed by the next time we get here. The question about whether he is going to require compression stockings will have to answer at that point Electronic Signature(s) Signed: 08/31/2020 5:22:04 PM By: Baltazar Najjar MD Entered  By: Baltazar Najjar on 08/31/2020 12:14:53 -------------------------------------------------------------------------------- SuperBill Details Patient Name: Date of Service: [name] 08/31/2020 Medical Record Number: [id number] Patient Account Number: [id number] Date of Birth/Sex: Treating RN: 1961-01-20 (59 y.o. [name] Primary Care Provider: Jamison [name] Other Clinician: Referring Provider: Treating Provider/Extender: [name], [name] in Treatment: 23 Diagnosis Coding ICD-10 Codes Code Description [phone] Non-pressure chronic ulcer of left ankle with fat layer exposed I87.302 Chronic venous hypertension (idiopathic) without complications of left lower extremity Facility Procedures CPT4 Code: [id number] Description: (Facility Use Only) [phone] - APPLY MULTLAY COMPRS LWR LT LEG Modifier: Quantity: 1 Physician Procedures : CPT4 Code Description Modifier [id number] - WC PHYS LEVEL 3 - EST PT ICD-10 Diagnosis Description L97.322 Non-pressure chronic ulcer of left ankle with fat layer exposed I87.302 Chronic venous hypertension (idiopathic) without complications of left  lower extremity Quantity: 1 Electronic Signature(s) Signed: 08/31/2020 5:22:04 PM By: Baltazar Najjar MD Entered By: Baltazar Najjar on 08/31/2020 12:15:11

## 2020-09-04 ENCOUNTER — Emergency Department (HOSPITAL_COMMUNITY): Payer: Medicare Other

## 2020-09-04 ENCOUNTER — Encounter (HOSPITAL_COMMUNITY): Payer: Self-pay

## 2020-09-04 ENCOUNTER — Other Ambulatory Visit: Payer: Self-pay

## 2020-09-04 ENCOUNTER — Inpatient Hospital Stay (HOSPITAL_COMMUNITY)
Admission: EM | Admit: 2020-09-04 | Discharge: 2020-09-07 | DRG: 193 | Disposition: A | Discharge status: Home Health | Payer: Medicare Other | Attending: Internal Medicine | Admitting: Internal Medicine

## 2020-09-04 DIAGNOSIS — R1032 Left lower quadrant pain: Secondary | ICD-10-CM

## 2020-09-04 DIAGNOSIS — J189 Pneumonia, unspecified organism: Secondary | ICD-10-CM | POA: Diagnosis not present

## 2020-09-04 DIAGNOSIS — Z89421 Acquired absence of other right toe(s): Secondary | ICD-10-CM

## 2020-09-04 DIAGNOSIS — K219 Gastro-esophageal reflux disease without esophagitis: Secondary | ICD-10-CM | POA: Diagnosis present

## 2020-09-04 DIAGNOSIS — F6381 Intermittent explosive disorder: Secondary | ICD-10-CM | POA: Diagnosis present

## 2020-09-04 DIAGNOSIS — Z993 Dependence on wheelchair: Secondary | ICD-10-CM

## 2020-09-04 DIAGNOSIS — E7800 Pure hypercholesterolemia, unspecified: Secondary | ICD-10-CM | POA: Diagnosis present

## 2020-09-04 DIAGNOSIS — Z7401 Bed confinement status: Secondary | ICD-10-CM

## 2020-09-04 DIAGNOSIS — F79 Unspecified intellectual disabilities: Secondary | ICD-10-CM | POA: Diagnosis present

## 2020-09-04 DIAGNOSIS — E559 Vitamin D deficiency, unspecified: Secondary | ICD-10-CM | POA: Diagnosis present

## 2020-09-04 DIAGNOSIS — G808 Other cerebral palsy: Secondary | ICD-10-CM | POA: Diagnosis present

## 2020-09-04 DIAGNOSIS — L89151 Pressure ulcer of sacral region, stage 1: Secondary | ICD-10-CM | POA: Diagnosis present

## 2020-09-04 DIAGNOSIS — J690 Pneumonitis due to inhalation of food and vomit: Secondary | ICD-10-CM | POA: Diagnosis present

## 2020-09-04 DIAGNOSIS — R109 Unspecified abdominal pain: Secondary | ICD-10-CM | POA: Diagnosis present

## 2020-09-04 DIAGNOSIS — Z20822 Contact with and (suspected) exposure to covid-19: Secondary | ICD-10-CM | POA: Diagnosis present

## 2020-09-04 DIAGNOSIS — R933 Abnormal findings on diagnostic imaging of other parts of digestive tract: Secondary | ICD-10-CM

## 2020-09-04 DIAGNOSIS — Z9889 Other specified postprocedural states: Secondary | ICD-10-CM

## 2020-09-04 DIAGNOSIS — Z91048 Other nonmedicinal substance allergy status: Secondary | ICD-10-CM

## 2020-09-04 DIAGNOSIS — D509 Iron deficiency anemia, unspecified: Secondary | ICD-10-CM | POA: Diagnosis present

## 2020-09-04 DIAGNOSIS — J918 Pleural effusion in other conditions classified elsewhere: Secondary | ICD-10-CM | POA: Diagnosis present

## 2020-09-04 DIAGNOSIS — K449 Diaphragmatic hernia without obstruction or gangrene: Secondary | ICD-10-CM | POA: Diagnosis present

## 2020-09-04 DIAGNOSIS — Z66 Do not resuscitate: Secondary | ICD-10-CM | POA: Diagnosis present

## 2020-09-04 DIAGNOSIS — J90 Pleural effusion, not elsewhere classified: Secondary | ICD-10-CM

## 2020-09-04 DIAGNOSIS — Z89411 Acquired absence of right great toe: Secondary | ICD-10-CM

## 2020-09-04 DIAGNOSIS — L89623 Pressure ulcer of left heel, stage 3: Secondary | ICD-10-CM | POA: Diagnosis present

## 2020-09-04 DIAGNOSIS — F429 Obsessive-compulsive disorder, unspecified: Secondary | ICD-10-CM | POA: Diagnosis present

## 2020-09-04 DIAGNOSIS — L98429 Non-pressure chronic ulcer of back with unspecified severity: Secondary | ICD-10-CM | POA: Diagnosis present

## 2020-09-04 DIAGNOSIS — F419 Anxiety disorder, unspecified: Secondary | ICD-10-CM | POA: Diagnosis present

## 2020-09-04 DIAGNOSIS — L8990 Pressure ulcer of unspecified site, unspecified stage: Secondary | ICD-10-CM | POA: Diagnosis present

## 2020-09-04 DIAGNOSIS — Z89511 Acquired absence of right leg below knee: Secondary | ICD-10-CM

## 2020-09-04 DIAGNOSIS — L89891 Pressure ulcer of other site, stage 1: Secondary | ICD-10-CM | POA: Diagnosis present

## 2020-09-04 DIAGNOSIS — L89892 Pressure ulcer of other site, stage 2: Secondary | ICD-10-CM | POA: Diagnosis present

## 2020-09-04 DIAGNOSIS — R1319 Other dysphagia: Secondary | ICD-10-CM | POA: Diagnosis present

## 2020-09-04 DIAGNOSIS — F319 Bipolar disorder, unspecified: Secondary | ICD-10-CM | POA: Diagnosis present

## 2020-09-04 LAB — CBC WITH DIFFERENTIAL/PLATELET
Abs Immature Granulocytes: 0.09 10*3/uL — ABNORMAL HIGH (ref 0.00–0.07)
Basophils Absolute: 0.1 10*3/uL (ref 0.0–0.1)
Basophils Relative: 1 %
Eosinophils Absolute: 0.2 10*3/uL (ref 0.0–0.5)
Eosinophils Relative: 2 %
HCT: 37.3 % — ABNORMAL LOW (ref 39.0–52.0)
Hemoglobin: 11.6 g/dL — ABNORMAL LOW (ref 13.0–17.0)
Immature Granulocytes: 1 %
Lymphocytes Relative: 29 %
Lymphs Abs: 3.2 10*3/uL (ref 0.7–4.0)
MCH: 24.8 pg — ABNORMAL LOW (ref 26.0–34.0)
MCHC: 31.1 g/dL (ref 30.0–36.0)
MCV: 79.7 fL — ABNORMAL LOW (ref 80.0–100.0)
Monocytes Absolute: 1.4 10*3/uL — ABNORMAL HIGH (ref 0.1–1.0)
Monocytes Relative: 13 %
Neutro Abs: 6 10*3/uL (ref 1.7–7.7)
Neutrophils Relative %: 54 %
Platelets: 328 10*3/uL (ref 150–400)
RBC: 4.68 MIL/uL (ref 4.22–5.81)
RDW: 18.2 % — ABNORMAL HIGH (ref 11.5–15.5)
WBC: 11 10*3/uL — ABNORMAL HIGH (ref 4.0–10.5)
nRBC: 0 % (ref 0.0–0.2)

## 2020-09-04 LAB — COMPREHENSIVE METABOLIC PANEL WITH GFR
ALT: 26 U/L (ref 0–44)
AST: 22 U/L (ref 15–41)
Albumin: 2.7 g/dL — ABNORMAL LOW (ref 3.5–5.0)
Alkaline Phosphatase: 153 U/L — ABNORMAL HIGH (ref 38–126)
Anion gap: 8 (ref 5–15)
BUN: 16 mg/dL (ref 6–20)
CO2: 25 mmol/L (ref 22–32)
Calcium: 9.1 mg/dL (ref 8.9–10.3)
Chloride: 108 mmol/L (ref 98–111)
Creatinine, Ser: 0.59 mg/dL — ABNORMAL LOW (ref 0.61–1.24)
GFR, Estimated: >60 mL/min
Glucose, Bld: 81 mg/dL (ref 70–99)
Potassium: 4.1 mmol/L (ref 3.5–5.1)
Sodium: 141 mmol/L (ref 135–145)
Total Bilirubin: <0.1 mg/dL — ABNORMAL LOW (ref 0.3–1.2)
Total Protein: 6.6 g/dL (ref 6.5–8.1)

## 2020-09-04 LAB — LIPASE, BLOOD: Lipase: 21 U/L (ref 11–51)

## 2020-09-04 LAB — TROPONIN I (HIGH SENSITIVITY): Troponin I (High Sensitivity): 3 ng/L (ref <18)

## 2020-09-04 MED ORDER — FENTANYL CITRATE (PF) 100 MCG/2ML IJ SOLN
25.0000 ug | Freq: Once | INTRAMUSCULAR | Status: AC
  Administered 2020-09-04: 25 ug via INTRAVENOUS
  Filled 2020-09-04: qty 2

## 2020-09-04 MED ORDER — IOHEXOL 350 MG/ML SOLN
100.0000 mL | Freq: Once | INTRAVENOUS | Status: AC | PRN
  Administered 2020-09-04: 100 mL via INTRAVENOUS

## 2020-09-04 MED ORDER — FENTANYL CITRATE (PF) 100 MCG/2ML IJ SOLN
50.0000 ug | Freq: Once | INTRAMUSCULAR | Status: AC
Start: 2020-09-04 — End: 2020-09-04
  Administered 2020-09-04: 50 ug via INTRAVENOUS
  Filled 2020-09-04: qty 2

## 2020-09-04 NOTE — ED Provider Notes (Signed)
[facility]-EMERGENCY DEPT Provider Note   CSN: [id number] Arrival date & time: 09/04/20  1958     History Chief Complaint  Patient presents with  . Cough    [name] is a 60 y.o. male.  The history is provided by the patient, a caregiver and medical records.  Cough  [name] is a 60 y.o. male who presents to the Emergency Department complaining of cough, abdominal pain. Level V caveat due to intellectual disability. History is provided by the patient and his caregiver. He presents to the emergency department from group home. About one month ago he was hospitalized for pneumonia after an amputation of his right lower extremity for osteomyelitis. He was doing well following his recent hospitalization and has not been sick over the last few weeks. Today he had a doctors appointment and did well at the appointment and then later when he returned home he was complaining of left sided abdominal pain and chest pain. Pain is worse with coughing and moving. He didn't want to eat his lunch secondary to discomfort. He does have a chronic cough that kind of comes and goes. He did eventually eat today. No fevers, vomiting, diarrhea, difficulty breathing.      Past Medical History:  Diagnosis Date  . Anemia    microcytic anemia   . Anxiety    takes Ativan daily as needed  . Bipolar disorder (HCC)    takes Depakote daily  . CEREBRAL PALSY 07/24/2006  . Chronic bronchitis (HCC)    "gets it q yr"  . Chronic bronchitis (HCC)   . 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)   . Constipation    takes Miralax daily as needed  . Depression    takes Risperdal nightly  . Environmental allergies    takes Zyrtec daily  . GASTRIC ULCER ACUTE WITH HEMORRHAGE 07/24/2006  . Gastric ulcer with hemorrhage    hx of   . GERD (gastroesophageal reflux disease)    takes Pravacid daily  . History of blood transfusion    no abnormal reaction noted  .  History of hiatal hernia   . History of shingles   . History of stomach ulcers ~ 1996  . Hypercholesterolemia   . Intellectual functioning disability   . Intermittent explosive disorder   . Internal hemorrhoids   . MRSA infection 09/06/2019  . Obsessive compulsive disorder   . Osteomyelitis (HCC)    right foot   . Pneumonia "quite a few times"   in 2015 and in June 2016  . Pressure ulcer of sacral region    hx of   . Pseudomonas aeruginosa infection 09/06/2019  . Psychotic disorder (HCC)   . Recurrent UTI (urinary tract infection)   . Seborrheic dermatitis   . Sigmoid diverticulosis   . Spastic tetraplegia (HCC)   . URINARY INCONTINENCE 02/09/2010   takes Detrol daily-occasionally incontinence  . Vitamin D deficiency   . Weakness    numbness in extremities    Patient Active Problem List   Diagnosis Date Noted  . Nausea and vomiting 07/31/2020  . 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 Osteomyelitis (HCC) 10/08/2019  . Acute osteomyelitis of right ankle or foot (HCC)   . Ulcerated, foot, right, with necrosis of bone (HCC)   . Pseudomonas aeruginosa infection 09/06/2019  . MRSA infection 09/06/2019  . Lactic acidosis   . Pressure injury of skin 08/10/2018  . Gastric wall thickening  08/10/2018  . Alkaline phosphatase elevation 08/10/2018  . Cholelithiasis 08/10/2018  . Microcytic anemia   . Hiatal hernia 04/07/2018  . 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 Quadriplegia (HCC) 12/15/2015  . Cerebral palsy, quadriplegic (HCC)   . Pre-diabetes 08/31/2015  . Foot ulcer, limited to breakdown of skin (HCC) 06/08/2015  . Seasonal allergies 11/01/2014  . Pressure ulcer of foot 03/14/2014  . CAP (community acquired pneumonia) 03/13/2014  . Pressure ulcer 01/08/2013  . URINARY INCONTINENCE 02/09/2010  . Mental retardation 07/24/2006  . Infantile cerebral palsy (HCC) 07/24/2006    Past Surgical History:  Procedure Laterality  Date  . AMPUTATION Right 12/14/2014   Procedure: AMPUTATION DIGIT RIGHT GREAT TOE MTP;  Surgeon: Nadara MustardMarcus Duda V, MD;  Location: [facility] OR;  Service: Orthopedics;  Laterality: Right;  . AMPUTATION Right 10/08/2019   Procedure: RIGHT FIFTH RAY AMPUTATION, ADJACENT TISSUE TRANSFER;  Surgeon: Park LiterPrice, Michael J, DPM;  Location: [facility];  Service: Podiatry;  Laterality: Right;  . AMPUTATION Right 07/27/2020   Procedure: AMPUTATION BELOW KNEE;  Surgeon: Toni ArthursHewitt, John, MD;  Location: [facility] OR;  Service: Orthopedics;  Laterality: Right;  60min  . BIOPSY  08/12/2018   Procedure: BIOPSY;  Surgeon: Lynann BolognaGupta, Rajesh, MD;  [facility];  Service: Gastroenterology;;  . COLONOSCOPY WITH PROPOFOL N/A 08/14/2018   Procedure: COLONOSCOPY WITH PROPOFOL;  Surgeon: Lynann BolognaGupta, Rajesh, MD;  Location: [facility];  Service: Gastroenterology;  Laterality: N/A;  . ESOPHAGOGASTRODUODENOSCOPY (EGD) WITH PROPOFOL N/A 08/12/2018   Procedure: ESOPHAGOGASTRODUODENOSCOPY (EGD) WITH PROPOFOL;  Surgeon: Lynann BolognaGupta, Rajesh, MD;  Location: [facility];  Service: Gastroenterology;  Laterality: N/A;  . GRAFT APPLICATION Right 11/12/2019   Procedure: SKIN GRAFT SUBSTITUTE APPLICATION;  Surgeon: Park LiterPrice, Michael J, DPM;  Location: [facility];  Service: Podiatry;  Laterality: Right;  . HERNIA REPAIR    . HIATAL HERNIA REPAIR  1982  . LEG SURGERY Bilateral ~ 1967   "legs were scissored; stretched them out"  . WOUND DEBRIDEMENT Right 11/12/2019   Procedure: DEBRIDEMENT WOUND;  Surgeon: Park LiterPrice, Michael J, DPM;  Location: [facility];  Service: Podiatry;  Laterality: Right;       Family History  Adopted: Yes    Social History   Tobacco Use  . Smoking status: Never Smoker  . Smokeless tobacco: Never Used  Substance Use Topics  . Alcohol use: No  . Drug use: No    Home Medications Prior to Admission medications   Medication Sig Start Date End Date Taking? Authorizing Provider  acetaminophen (TYLENOL) 500 MG tablet Take 500 mg by mouth every 6 (six) hours  as needed for moderate pain.    [provider]  albuterol (VENTOLIN HFA) 108 (90 Base) MCG/ACT inhaler Inhale 2 puffs into the lungs every 4 (four) hours as needed for shortness of breath or wheezing. 01/11/20   [provider]  alum & mag hydroxide-simeth (MAALOX/MYLANTA) 200-200-20 MG/5ML suspension Take 30 mLs by mouth every 6 (six) hours as needed for indigestion or heartburn. Use as directed as needed for nausea/indigestion per standing order    [provider]  aspirin EC 81 MG tablet Take 1 tablet (81 mg total) by mouth 2 (two) times daily. 07/27/20   Jacinta Shoellis, Justin Pike, PA-C  baclofen (LIORESAL) 20 MG tablet Take 1 tablet (20 mg total) by mouth 3 (three) times daily. 07/18/16   Kirsteins, Victorino SparrowAndrew E, MD  bisacodyl (DULCOLAX) 10 MG suppository Place 1 suppository (10 mg total) rectally daily as needed for moderate constipation. 08/03/20  Sheikh, Omair Latif, DO  brompheniramine-pseudoephedrine (DIMETAPP) 1-15 MG/5ML ELIX Take 10 mLs by mouth daily as needed (Cold sympton).     [provider]  cetirizine (ZYRTEC) 10 MG tablet Take 1 tablet (10 mg total) by mouth daily. Patient taking differently: Take 10 mg by mouth at bedtime. 11/01/14   Street, Stephanie Coup, MD  Cholecalciferol (VITAMIN D3) 5000 units CAPS Take 5,000 Units by mouth daily.    [provider]  clotrimazole (LOTRIMIN) 1 % cream Apply 1 application topically 2 (two) times daily.    [provider]  divalproex (DEPAKOTE SPRINKLE) 125 MG capsule Take 500 mg by mouth 2 (two) times daily.     [provider]  Elastic Bandages & Supports (JOBST ANTI-EM KNEE HIGH MED) MISC 1 each by Does not apply route daily. Apply once in the morning and remove at bedtime daily    [provider]  fluticasone (FLONASE) 50 MCG/ACT nasal spray Place 1 spray into both nostrils daily. 12/21/19   [provider]  Foot Care Products (PREVALON HEEL PROTECTOR) MISC 1 Device by Does not  apply route daily. 06/20/15   Marquette Saa, MD  guaiFENesin-dextromethorphan [facility] DM) 100-10 MG/5ML syrup Take 10 mLs by mouth every 4 (four) hours as needed for cough.     [provider]  hydrocortisone cream 1 % APPLY TOPICALLY AT BEDTIME AS NEEDED FOR ITCHING Patient taking differently: Apply 1 application topically at bedtime. Face 05/29/20   Mullis, Kiersten P, DO  iron polysaccharides (NIFEREX) 150 MG capsule Take 150 mg by mouth daily at 12 noon.     [provider]  ketoconazole (NIZORAL) 2 % shampoo APPLY TOPICALLY TO AFFECTED AREA(S) TWICE WEEKLY AS DIRECTED Patient taking differently: Apply 1 application topically 2 (two) times a week. 12/22/19   Mullis, Kiersten P, DO  LORazepam (ATIVAN) 2 MG tablet Take 1 mg by mouth every 4 (four) hours as needed for anxiety.     [provider]  mupirocin ointment (BACTROBAN) 2 % Apply 1 application topically daily as needed (wound care).    [provider]  Neomycin-Bacitracin-Polymyxin (TRIPLE ANTIBIOTIC EX) Apply 1 application topically daily as needed (cuts/scrapes/scratches).    [provider]  omeprazole (PRILOSEC) 40 MG capsule Take 1 capsule (40 mg total) by mouth 2 (two) times daily. 08/15/18   Rodolph Bong, MD  ondansetron (ZOFRAN) 4 MG tablet Take 1 tablet (4 mg total) by mouth every 6 (six) hours as needed for nausea. 08/03/20   Marguerita Merles Latif, DO  oxyCODONE (OXY IR/ROXICODONE) 5 MG immediate release tablet Take 5 mg by mouth every 6 (six) hours as needed for severe pain.    [provider]  Ethelda Chick (OYSTER CALCIUM) 500 MG TABS tablet Take 1,500 mg of elemental calcium by mouth daily.    [provider]  polyethylene glycol (MIRALAX) 17 g packet Take 17 g by mouth daily as needed for moderate constipation. Patient taking differently: Take 17 g by mouth daily as needed for moderate constipation. Clearlax power 10/09/19   Margie Ege A, DO   risperiDONE (RISPERDAL) 2 MG tablet Take 2 mg by mouth at bedtime.    [provider]  senna-docusate (SENOKOT-S) 8.6-50 MG tablet Take 1 tablet by mouth at bedtime. 08/03/20   Marguerita Merles Latif, DO  tamsulosin (FLOMAX) 0.4 MG CAPS capsule TAKE 1 CAPSULE BY MOUTH EVERY DAY *DO NOT CRUSH* Patient taking differently: Take 0.4 mg by mouth daily. 06/19/20   Mullis, Dara Lords,  DO  tolterodine (DETROL LA) 2 MG 24 hr capsule Take 1 capsule (2 mg total) by mouth daily. 04/08/13   Street, Stephanie Coup, MD  tretinoin (RETIN-A) 0.025 % gel Apply 1 application topically at bedtime. Apply topically to face area    [provider]  vitamin C (VITAMIN C) 250 MG tablet Take 1 tablet (250 mg total) by mouth daily. 01/18/16   Palma Holter, MD  VOLTAREN 1 % GEL APPLY 4 GRAMS TOPICALLY TO AFFECTED AREA(S) FOUR TIMES DAILY Patient taking differently: Apply 4 g topically 4 (four) times daily. 06/16/19   Mullis, Kiersten P, DO  Zinc Oxide 40 % PSTE Apply 1 application topically 2 (two) times daily as needed (unspecified). Desitin Max strength paste apply thin layer topically to buttocks and scrotum 06/07/20   McDiarmid, Leighton Roach, MD    Allergies    Adhesive [tape]  Review of Systems   Review of Systems  Respiratory: Positive for cough.   All other systems reviewed and are negative.   Physical Exam Updated Vital Signs BP 117/79   Pulse 78   Resp 12   Ht 6\' 2"  (1.88 m)   Wt 98.4 kg   SpO2 95%   BMI 27.85 kg/m   Physical Exam Vitals and nursing note reviewed.  Constitutional:      Appearance: He is well-developed.  HENT:     Head: Normocephalic and atraumatic.  Cardiovascular:     Rate and Rhythm: Normal rate and regular rhythm.     Heart sounds: No murmur heard.   Pulmonary:     Effort: Pulmonary effort is normal. No respiratory distress.     Breath sounds: Normal breath sounds.  Abdominal:     Palpations: Abdomen is soft.     Tenderness: There is abdominal tenderness.  There is no guarding or rebound.     Comments: Moderate left sided abdominal tenderness  Musculoskeletal:        General: No tenderness.     Comments: RLE BKA.  LLE with pitting edema to foot, dressing in place from foot to knee - c/d/i.    Skin:    General: Skin is warm and dry.     Coloration: Skin is pale.  Neurological:     Mental Status: He is alert.     Comments: Awake and alert, slow to answer questions  Psychiatric:        Behavior: Behavior normal.     ED Results / Procedures / Treatments   Labs (all labs ordered are listed, but only abnormal results are displayed) Labs Reviewed  COMPREHENSIVE METABOLIC PANEL - Abnormal; Notable for the following components:      Result Value   Creatinine, Ser 0.59 (*)    Albumin 2.7 (*)    Alkaline Phosphatase 153 (*)    Total Bilirubin <0.1 (*)    All other components within normal limits  CBC WITH DIFFERENTIAL/PLATELET - Abnormal; Notable for the following components:   WBC 11.0 (*)    Hemoglobin 11.6 (*)    HCT 37.3 (*)    MCV 79.7 (*)    MCH 24.8 (*)    RDW 18.2 (*)    Monocytes Absolute 1.4 (*)    Abs Immature Granulocytes 0.09 (*)    All other components within normal limits  LIPASE, BLOOD  URINALYSIS, ROUTINE W REFLEX MICROSCOPIC  TROPONIN I (HIGH SENSITIVITY)  TROPONIN I (HIGH SENSITIVITY)    EKG EKG Interpretation  Date/Time:  Monday September 04 2020 21:19:07 EDT Ventricular  Rate:  79 PR Interval:  145 QRS Duration: 93 QT Interval:  310 QTC Calculation: 356 R Axis:   38 Text Interpretation: Sinus rhythm Abnormal R-wave progression, early transition Borderline repolarization abnormality Confirmed by Tilden Fossa [phone]) on 09/04/2020 9:22:00 PM   Radiology DG Chest Port 1 View  Result Date: 09/04/2020 CLINICAL DATA:  Cough and chest pressure.  Chest pain. EXAM: PORTABLE CHEST 1 VIEW COMPARISON:  Radiograph 08/03/2020.  CT 07/31/2020 FINDINGS: Patient is rotated due to contracture. Volume loss in the right  hemithorax with progressive opacification primarily involving the lower thorax. This likely represents combination of pleural fluid and airspace disease/atelectasis. No focal left lung abnormalities. Retrocardiac hiatal hernia is only faintly visualized. Heart is grossly normal in size. No pneumothorax. IMPRESSION: Volume loss in the right hemithorax with progressive opacification primarily involving the lower thorax. This likely represents combination of pleural fluid and airspace disease/atelectasis. Electronically Signed   By: Narda Rutherford M.D.   On: 09/04/2020 21:34    Procedures Procedures   Medications Ordered in ED Medications  fentaNYL (SUBLIMAZE) injection 50 mcg (50 mcg Intravenous Given 09/04/20 2149)  fentaNYL (SUBLIMAZE) injection 25 mcg (25 mcg Intravenous Given 09/04/20 2258)  iohexol (OMNIPAQUE) 350 MG/ML injection 100 mL (100 mLs Intravenous Contrast Given 09/04/20 2353)    ED Course  I have reviewed the triage vital signs and the nursing notes.  Pertinent labs & imaging results that were available during my care of the patient were reviewed by me and considered in my medical decision making (see chart for details).    MDM Rules/Calculators/A&P                         patient with history of CP, recent BKA for osteomyelitis here for evaluation of abdominal pain, cough. He has left-sided abdominal tenderness on examination without peritoneal findings. He has no respiratory distress. 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 Will check CTA to rule out PE given his recent hospitalization. Given his abdominal tenderness will obtain a CT abdomen pelvis to further evaluate his abdominal pain. Patient care transferred pending imaging.  Final Clinical Impression(s) / ED Diagnoses Final diagnoses:  None    Rx / DC Orders ED Discharge Orders    None       Tilden Fossa, MD 09/05/20 [phone]

## 2020-09-04 NOTE — ED Triage Notes (Signed)
Pt to ED by EMS from group home with c/o cough and discomfort when coughing which began today, lung sounds are clear. Pt also reports lack of appetite. Arrives A+O, VSS, NADN.

## 2020-09-05 DIAGNOSIS — J189 Pneumonia, unspecified organism: Principal | ICD-10-CM

## 2020-09-05 DIAGNOSIS — R933 Abnormal findings on diagnostic imaging of other parts of digestive tract: Secondary | ICD-10-CM

## 2020-09-05 DIAGNOSIS — R109 Unspecified abdominal pain: Secondary | ICD-10-CM | POA: Diagnosis not present

## 2020-09-05 DIAGNOSIS — F429 Obsessive-compulsive disorder, unspecified: Secondary | ICD-10-CM | POA: Diagnosis present

## 2020-09-05 DIAGNOSIS — E559 Vitamin D deficiency, unspecified: Secondary | ICD-10-CM | POA: Diagnosis present

## 2020-09-05 DIAGNOSIS — Z91048 Other nonmedicinal substance allergy status: Secondary | ICD-10-CM | POA: Diagnosis not present

## 2020-09-05 DIAGNOSIS — G808 Other cerebral palsy: Secondary | ICD-10-CM | POA: Diagnosis not present

## 2020-09-05 DIAGNOSIS — L89151 Pressure ulcer of sacral region, stage 1: Secondary | ICD-10-CM | POA: Diagnosis present

## 2020-09-05 DIAGNOSIS — L89623 Pressure ulcer of left heel, stage 3: Secondary | ICD-10-CM | POA: Diagnosis present

## 2020-09-05 DIAGNOSIS — E7800 Pure hypercholesterolemia, unspecified: Secondary | ICD-10-CM | POA: Diagnosis present

## 2020-09-05 DIAGNOSIS — K5900 Constipation, unspecified: Secondary | ICD-10-CM | POA: Diagnosis not present

## 2020-09-05 DIAGNOSIS — Z7401 Bed confinement status: Secondary | ICD-10-CM | POA: Diagnosis not present

## 2020-09-05 DIAGNOSIS — F319 Bipolar disorder, unspecified: Secondary | ICD-10-CM | POA: Diagnosis present

## 2020-09-05 DIAGNOSIS — F79 Unspecified intellectual disabilities: Secondary | ICD-10-CM | POA: Diagnosis present

## 2020-09-05 DIAGNOSIS — J918 Pleural effusion in other conditions classified elsewhere: Secondary | ICD-10-CM | POA: Diagnosis present

## 2020-09-05 DIAGNOSIS — K449 Diaphragmatic hernia without obstruction or gangrene: Secondary | ICD-10-CM | POA: Diagnosis present

## 2020-09-05 DIAGNOSIS — L89891 Pressure ulcer of other site, stage 1: Secondary | ICD-10-CM | POA: Diagnosis present

## 2020-09-05 DIAGNOSIS — K219 Gastro-esophageal reflux disease without esophagitis: Secondary | ICD-10-CM | POA: Diagnosis present

## 2020-09-05 DIAGNOSIS — Z993 Dependence on wheelchair: Secondary | ICD-10-CM | POA: Diagnosis not present

## 2020-09-05 DIAGNOSIS — D509 Iron deficiency anemia, unspecified: Secondary | ICD-10-CM | POA: Diagnosis present

## 2020-09-05 DIAGNOSIS — J690 Pneumonitis due to inhalation of food and vomit: Secondary | ICD-10-CM | POA: Diagnosis present

## 2020-09-05 DIAGNOSIS — L89892 Pressure ulcer of other site, stage 2: Secondary | ICD-10-CM | POA: Diagnosis present

## 2020-09-05 DIAGNOSIS — F6381 Intermittent explosive disorder: Secondary | ICD-10-CM | POA: Diagnosis present

## 2020-09-05 DIAGNOSIS — Z20822 Contact with and (suspected) exposure to covid-19: Secondary | ICD-10-CM | POA: Diagnosis present

## 2020-09-05 DIAGNOSIS — Z89511 Acquired absence of right leg below knee: Secondary | ICD-10-CM | POA: Diagnosis not present

## 2020-09-05 DIAGNOSIS — Z66 Do not resuscitate: Secondary | ICD-10-CM | POA: Diagnosis present

## 2020-09-05 DIAGNOSIS — R1319 Other dysphagia: Secondary | ICD-10-CM | POA: Diagnosis present

## 2020-09-05 LAB — HIV ANTIBODY (ROUTINE TESTING W REFLEX): HIV Screen 4th Generation wRfx: NONREACTIVE

## 2020-09-05 LAB — RESP PANEL BY RT-PCR (FLU A&B, COVID) ARPGX2
Influenza A by PCR: NEGATIVE
Influenza B by PCR: NEGATIVE
SARS Coronavirus 2 by RT PCR: NEGATIVE

## 2020-09-05 LAB — TROPONIN I (HIGH SENSITIVITY): Troponin I (High Sensitivity): 3 ng/L (ref <18)

## 2020-09-05 LAB — PROCALCITONIN: Procalcitonin: <0.1 ng/mL

## 2020-09-05 LAB — STREP PNEUMONIAE URINARY ANTIGEN: Strep Pneumo Urinary Antigen: NEGATIVE

## 2020-09-05 MED ORDER — BACLOFEN 20 MG PO TABS
20.0000 mg | ORAL_TABLET | Freq: Three times a day (TID) | ORAL | Status: DC
  Administered 2020-09-05 – 2020-09-07 (×6): 20 mg via ORAL
  Filled 2020-09-05 (×6): qty 1

## 2020-09-05 MED ORDER — RISPERIDONE 2 MG PO TABS
2.0000 mg | ORAL_TABLET | Freq: Every day | ORAL | Status: DC
  Administered 2020-09-05 – 2020-09-06 (×2): 2 mg via ORAL
  Filled 2020-09-05 (×2): qty 1

## 2020-09-05 MED ORDER — PANTOPRAZOLE SODIUM 40 MG PO TBEC
40.0000 mg | DELAYED_RELEASE_TABLET | Freq: Every day | ORAL | Status: DC
  Administered 2020-09-05 – 2020-09-07 (×3): 40 mg via ORAL
  Filled 2020-09-05 (×3): qty 1

## 2020-09-05 MED ORDER — ENOXAPARIN SODIUM 40 MG/0.4ML ~~LOC~~ SOLN
40.0000 mg | SUBCUTANEOUS | Status: DC
  Administered 2020-09-05 – 2020-09-07 (×3): 40 mg via SUBCUTANEOUS
  Filled 2020-09-05 (×3): qty 0.4

## 2020-09-05 MED ORDER — TAMSULOSIN HCL 0.4 MG PO CAPS
0.4000 mg | ORAL_CAPSULE | Freq: Every day | ORAL | Status: DC
  Administered 2020-09-05 – 2020-09-06 (×2): 0.4 mg via ORAL
  Filled 2020-09-05 (×2): qty 1

## 2020-09-05 MED ORDER — DIVALPROEX SODIUM 125 MG PO CSDR
500.0000 mg | DELAYED_RELEASE_CAPSULE | Freq: Two times a day (BID) | ORAL | Status: DC
  Administered 2020-09-05 – 2020-09-07 (×5): 500 mg via ORAL
  Filled 2020-09-05 (×6): qty 4

## 2020-09-05 MED ORDER — MORPHINE SULFATE (PF) 2 MG/ML IV SOLN
1.0000 mg | INTRAVENOUS | Status: DC | PRN
  Administered 2020-09-05: 1 mg via INTRAVENOUS
  Filled 2020-09-05: qty 1

## 2020-09-05 MED ORDER — LORAZEPAM 1 MG PO TABS: 1.0000 mg | ORAL_TABLET | ORAL | Status: DC | PRN

## 2020-09-05 MED ORDER — BISACODYL 10 MG RE SUPP: 10.0000 mg | Freq: Every day | RECTAL | Status: DC | PRN

## 2020-09-05 MED ORDER — METRONIDAZOLE IN NACL 5-0.79 MG/ML-% IV SOLN
500.0000 mg | Freq: Three times a day (TID) | INTRAVENOUS | Status: DC
  Administered 2020-09-05 – 2020-09-06 (×4): 500 mg via INTRAVENOUS
  Filled 2020-09-05 (×6): qty 100

## 2020-09-05 MED ORDER — SODIUM CHLORIDE 0.9 % IV SOLN
500.0000 mg | INTRAVENOUS | Status: DC
  Administered 2020-09-05 – 2020-09-07 (×3): 500 mg via INTRAVENOUS
  Filled 2020-09-05 (×3): qty 500

## 2020-09-05 MED ORDER — SENNOSIDES-DOCUSATE SODIUM 8.6-50 MG PO TABS: 1.0000 | ORAL_TABLET | Freq: Every day | ORAL | Status: DC

## 2020-09-05 MED ORDER — VANCOMYCIN HCL 2000 MG/400ML IV SOLN
2000.0000 mg | Freq: Once | INTRAVENOUS | Status: AC
  Administered 2020-09-05: 2000 mg via INTRAVENOUS
  Filled 2020-09-05: qty 400

## 2020-09-05 MED ORDER — SODIUM CHLORIDE 0.9 % IV SOLN
2.0000 g | Freq: Three times a day (TID) | INTRAVENOUS | Status: DC
  Administered 2020-09-05 – 2020-09-06 (×4): 2 g via INTRAVENOUS
  Filled 2020-09-05 (×5): qty 2

## 2020-09-05 MED ORDER — SENNOSIDES-DOCUSATE SODIUM 8.6-50 MG PO TABS
2.0000 | ORAL_TABLET | Freq: Every day | ORAL | Status: DC
  Administered 2020-09-05 – 2020-09-06 (×2): 2 via ORAL
  Filled 2020-09-05 (×2): qty 2

## 2020-09-05 MED ORDER — POLYSACCHARIDE IRON COMPLEX 150 MG PO CAPS
150.0000 mg | ORAL_CAPSULE | Freq: Every day | ORAL | Status: DC
  Administered 2020-09-05 – 2020-09-07 (×3): 150 mg via ORAL
  Filled 2020-09-05 (×3): qty 1

## 2020-09-05 MED ORDER — HYDROCERIN EX CREA
TOPICAL_CREAM | Freq: Two times a day (BID) | CUTANEOUS | Status: DC
  Filled 2020-09-05: qty 113

## 2020-09-05 MED ORDER — SODIUM CHLORIDE 0.9 % IV SOLN
2.0000 g | Freq: Once | INTRAVENOUS | Status: AC
  Administered 2020-09-05: 2 g via INTRAVENOUS
  Filled 2020-09-05: qty 2

## 2020-09-05 MED ORDER — FENTANYL CITRATE (PF) 100 MCG/2ML IJ SOLN
50.0000 ug | Freq: Once | INTRAMUSCULAR | Status: AC
  Administered 2020-09-05: 50 ug via INTRAVENOUS
  Filled 2020-09-05: qty 2

## 2020-09-05 NOTE — Hospital Course (Addendum)
[name] is a 60 yo male with PMH cerebral palsy, IDD, microcytic anemia, anxiety, bipolar disorder, decubitus ulcers, 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 Patient resides in a group home. He was recently hospitalized 3/7 - 3/10 for right sided PNA discharged with abx to complete course. 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  He presents back to the hospital from his group home due to worsening complaints of cough, coarse breath sounds, abdominal pain, nausea.  He underwent CTA chest and CT abd/pelvis in the ER.This showed diffuse severe right sided PNA with partially loculated right pleural effusion and small left effusion.  CT abd/pelvis showed rectal wall thickening possibly concerning for proctitis and also showed prominent small bowel loops within the mid right abdomen which were considered possibly transient versus early partial SBO.  He was started on antibiotics and admitted for further work-up and monitoring.  He is a poor historian given his underlying intellectual disability, however from what could be gathered, it does appear that he chronically lays on his right side and may have some issues with swallowing safely at times also.

## 2020-09-05 NOTE — Assessment & Plan Note (Signed)
-   continue risperdal, ativan, depakote - resume baclofen when able - resides in a group home; wheelchair and bedbound

## 2020-09-05 NOTE — Assessment & Plan Note (Signed)
-   attempt to turn/rotate in bed; known history of multiple decub ulcers

## 2020-09-05 NOTE — Assessment & Plan Note (Addendum)
-   high risk for MDRO given recent abx use, recent hospitalization, chronic debility and resides in a group home - s/p vanc in ER x 1 - continue on cefepime, azithro; add flagyl due to concern for aspiration - check urine legionella and Strep pna - follow up MRSA screen; resume vanc if positive - currently full code and on RA; need to clarify code status with group home in am - given dense pna and loculated effusion, would benefit from pulm evaluation in am; seems higher risk for invasive intervention but may be necessary - follow up blood cultures - SLP eval due to concern for aspiration events

## 2020-09-05 NOTE — Assessment & Plan Note (Signed)
-  Baseline hemoglobin 11 to 12 g/dL -Currently at baseline -Continue oral iron pending SLP evaluation

## 2020-09-05 NOTE — Evaluation (Signed)
Clinical/Bedside Swallow Evaluation Patient Details  Name: [name] MRN: [id number] Date of Birth: 02/21/1961  Today's Date: 09/05/2020 Time: SLP Start Time (ACUTE ONLY): 1115 SLP Stop Time (ACUTE ONLY): 1140 SLP Time Calculation (min) (ACUTE ONLY): 25 min  Past Medical History:  Past Medical History:  Diagnosis Date  . Anemia    microcytic anemia   . Anxiety    takes Ativan daily as needed  . Bipolar disorder (HCC)    takes Depakote daily  . CEREBRAL PALSY 07/24/2006  . Chronic bronchitis (HCC)    "gets it q yr"  . Chronic bronchitis (HCC)   . 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)   . Constipation    takes Miralax daily as needed  . Depression    takes Risperdal nightly  . Environmental allergies    takes Zyrtec daily  . GASTRIC ULCER ACUTE WITH HEMORRHAGE 07/24/2006  . Gastric ulcer with hemorrhage    hx of   . GERD (gastroesophageal reflux disease)    takes Pravacid daily  . History of blood transfusion    no abnormal reaction noted  . History of hiatal hernia   . History of shingles   . History of stomach ulcers ~ 1996  . Hypercholesterolemia   . Intellectual functioning disability   . Intermittent explosive disorder   . Internal hemorrhoids   . MRSA infection 09/06/2019  . Obsessive compulsive disorder   . Osteomyelitis (HCC)    right foot   . Pneumonia "quite a few times"   in 2015 and in June 2016  . Pressure ulcer of sacral region    hx of   . Pseudomonas aeruginosa infection 09/06/2019  . Psychotic disorder (HCC)   . Recurrent UTI (urinary tract infection)   . Seborrheic dermatitis   . Sigmoid diverticulosis   . Spastic tetraplegia (HCC)   . URINARY INCONTINENCE 02/09/2010   takes Detrol daily-occasionally incontinence  . Vitamin D deficiency   . Weakness    numbness in extremities   Past Surgical History:  Past Surgical History:  Procedure Laterality Date  . AMPUTATION Right 12/14/2014   Procedure: AMPUTATION DIGIT  RIGHT GREAT TOE MTP;  Surgeon: Nadara Mustard, MD;  Location: [facility];  Service: Orthopedics;  Laterality: Right;  . AMPUTATION Right 10/08/2019   Procedure: RIGHT FIFTH RAY AMPUTATION, ADJACENT TISSUE TRANSFER;  Surgeon: Park Liter, DPM;  Location: [facility];  Service: Podiatry;  Laterality: Right;  . AMPUTATION Right 07/27/2020   Procedure: AMPUTATION BELOW KNEE;  Surgeon: Toni Arthurs, MD;  Location: [facility];  Service: Orthopedics;  Laterality: Right;   . BIOPSY  08/12/2018   Procedure: BIOPSY;  Surgeon: Lynann Bologna, MD;  Location: [facility];  Service: Gastroenterology;;  . COLONOSCOPY WITH PROPOFOL N/A 08/14/2018   Procedure: COLONOSCOPY WITH PROPOFOL;  Surgeon: Lynann Bologna, MD;  Location: [facility];  Service: Gastroenterology;  Laterality: N/A;  . ESOPHAGOGASTRODUODENOSCOPY (EGD) WITH PROPOFOL N/A 08/12/2018   Procedure: ESOPHAGOGASTRODUODENOSCOPY (EGD) WITH PROPOFOL;  Surgeon: Lynann Bologna, MD;  Location: [facility];  Service: Gastroenterology;  Laterality: N/A;  . GRAFT APPLICATION Right 11/12/2019   Procedure: SKIN GRAFT SUBSTITUTE APPLICATION;  Surgeon: Park Liter, DPM;  Location: [facility];  Service: Podiatry;  Laterality: Right;  . HERNIA REPAIR    . HIATAL HERNIA REPAIR  1982  . LEG SURGERY Bilateral ~ 1967   "legs were scissored; stretched them out"  . 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 Patient resides in a group home.  He was recently hospitalized 3/7 - 3/10 for right sided PNA discharged with abx to complete course. 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   He presents back to the hospital from his group home due to worsening complaints of cough, coarse breath sounds, abdominal pain, nausea.   Pt is quadriplegic continue risperdal, ativan, depakote  - resume baclofen when able  - resides in a group home; wheelchair and bedbound.  CT angio of chest showed moderate to marked severity of right upper lobe, right middle lobe and right lower lobe airspace disease with large partially loculated right pleural effusion and a large hiatal hernia  CT abdomen pelvis showed marked severity proctitis with possible underlying neoplastic process, mildly prominent small bowel loops, large gastric hernia.  Enlarged prostate.  Pt reports he coughs on occasion with food more than drink and his sister states he is on a soft/thin diet at his group home.   Assessment / Plan / Recommendation Clinical Impression  Pt without focal CN deficits - but is noted to have secretion retention in oral cavity - which spilled from left side *Pt lying on left side.  Oral suctioning provided to remove.  Given concern for impaired bowel clearance, only provided pt with limited clears including 5 ice chips, 4 bites of jello and 1 ounce of apple juice *via tsp and straw.  Pt readily accepted intake - - mild delay clinically observed with ice chips (SLP had to request pt swallow x2/5 ice chips).  Four of fifth ice chip resulted in cough and expectoration of secretions *mobilized pharyngeal secretions presumed.  No indication of aspiration with jello nor thin via tsp or straw, however multiple audible swallows (x3 with each bolus) noted with straw boluses of thin with delayed cough and expectoration of frothy secretions.  Given pt's quadriparesis, recurrent pna, hospital admit x3 within 1.5 months, and poor positioning, recommend clears - VIA TSP ONLY if MD approves and medicine with puree.   Pt requires being fed by staff due to his quadriparesis.  His positioning in bed is poor due to discomfort with HOB elevation (even in reverse trendelenberg and slight head elevation to approx 35*.   Will follow up for education with family/pt and dietary  tolerance. SLP Visit Diagnosis: Dysphagia, unspecified (R13.10)    Aspiration Risk  Moderate aspiration risk    Diet Recommendation Thin liquid (clears via tsp)   Medication Administration: Other (Comment) (whole if small) Supervision: Staff to assist with self feeding Postural Changes:  (upright as much as able and remain upright after po as able)    Other  Recommendations Oral Care Recommendations: Oral care QID   Follow up Recommendations Other (comment) (TBD)      Frequency and Duration min 2x/week  2 weeks       Prognosis Prognosis for Safe Diet Advancement: Fair      Swallow Study   General Date of Onset: 09/05/20 HPI: [name] is a 60 yo male with PMH cerebral palsy, IDD, microcytic anemia, anxiety, bipolar disorder, decubitus ulcers, 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 Patient resides in a group home.  He was recently hospitalized 3/7 - 3/10 for right sided PNA discharged with abx to complete course.  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   He presents back to the hospital from his group home due to worsening complaints of cough, coarse breath sounds, abdominal pain, nausea.  Pt is quadriplegic continue risperdal, ativan, depakote  - resume baclofen when able  - resides in a group home; wheelchair and bedbound.  CT angio of chest showed moderate to marked severity of right upper lobe, right middle lobe and right lower lobe airspace disease with large partially loculated right pleural effusion and a large hiatal hernia  CT abdomen pelvis showed marked severity proctitis with possible underlying neoplastic process, mildly prominent small bowel loops, large gastric hernia.  Enlarged prostate.  Pt reports he coughs on occasion with food more than drink and his sister states he is on a soft/thin diet at his group home. Type of Study: Bedside Swallow Evaluation Previous Swallow Assessment: esophagram 07/2020 basically normal, swallow appeared to trigger at pyriform sinus Diet Prior to  this Study: NPO Temperature Spikes Noted: No Respiratory Status: Room air History of Recent Intubation: No Behavior/Cognition: Alert;Cooperative;Pleasant mood Oral Cavity Assessment: Excessive secretions;Other (comment) (minimal excessive secretions in oral cavity- oral suction set up and oral suctioning conducted) Oral Care Completed by SLP:  (n/a) Oral Cavity - Dentition: Adequate natural dentition Self-Feeding Abilities: Total assist Patient Positioning: Partially reclined Baseline Vocal Quality: Normal Volitional Cough: Strong Volitional Swallow: Able to elicit    Oral/Motor/Sensory Function Overall Oral Motor/Sensory Function: Within functional limits   Ice Chips Ice chips: Impaired Presentation: Spoon Pharyngeal Phase Impairments: Suspected delayed Swallow;Cough - Immediate Other Comments: Immediate cough with 1 of 5 ice chip boluses with expectoration of secretions.  Suspect water that spilled into pharynx mobilized secretions.   Thin Liquid Thin Liquid: Impaired Presentation: Spoon;Straw Pharyngeal  Phase Impairments: Multiple swallows    Nectar Thick Nectar Thick Liquid: Not tested   Honey Thick Honey Thick Liquid: Not tested   Puree Puree: Impaired Presentation: Self Fed;Spoon Oral Phase Impairments: Reduced labial seal;Reduced lingual movement/coordination   Solid     Solid: Not tested      Chales Abrahams 09/05/2020,1:17 PM   [name], MS [facility] SLP Acute Rehab Services Office [phone] Pager [phone]

## 2020-09-05 NOTE — ED Provider Notes (Signed)
Nursing notes and vitals signs, including pulse oximetry, reviewed.  Summary of this visit's results, reviewed by myself:  EKG:  EKG Interpretation  Date/Time:  Monday September 04 2020 21:19:07 EDT Ventricular Rate:  79 PR Interval:  145 QRS Duration: 93 QT Interval:  310 QTC Calculation: 356 R Axis:   38 Text Interpretation: Sinus rhythm Abnormal R-wave progression, early transition Borderline repolarization abnormality Confirmed by Tilden Fossa [phone]) on 09/04/2020 9:22:00 PM       Labs:  Results for orders placed or performed during the hospital encounter of 09/04/20 (from the past 24 hour(s))  Comprehensive metabolic panel     Status: Abnormal   Collection Time: 09/04/20  9:50 PM  Result Value Ref Range   Sodium 141 135 - 145 mmol/L   Potassium 4.1 3.5 - 5.1 mmol/L   Chloride 108 98 - 111 mmol/L   CO2 25 22 - 32 mmol/L   Glucose, Bld 81 70 - 99 mg/dL   BUN 16 6 - 20 mg/dL   Creatinine, Ser 3.81 (L) 0.61 - 1.24 mg/dL   Calcium 9.1 8.9 - 77.1 mg/dL   Total Protein 6.6 6.5 - 8.1 g/dL   Albumin 2.7 (L) 3.5 - 5.0 g/dL   AST 22 15 - 41 U/L   ALT 26 0 - 44 U/L   Alkaline Phosphatase 153 (H) 38 - 126 U/L   Total Bilirubin <0.1 (L) 0.3 - 1.2 mg/dL   GFR, Estimated >16 >57 mL/min   Anion gap 8 5 - 15  Lipase, blood     Status: None   Collection Time: 09/04/20  9:50 PM  Result Value Ref Range   Lipase 21 11 - 51 U/L  CBC with Differential     Status: Abnormal   Collection Time: 09/04/20  9:50 PM  Result Value Ref Range   WBC 11.0 (H) 4.0 - 10.5 K/uL   RBC 4.68 4.22 - 5.81 MIL/uL   Hemoglobin 11.6 (L) 13.0 - 17.0 g/dL   HCT 90.3 (L) 83.3 - 38.3 %   MCV 79.7 (L) 80.0 - 100.0 fL   MCH 24.8 (L) 26.0 - 34.0 pg   MCHC 31.1 30.0 - 36.0 g/dL   RDW 29.1 (H) 91.6 - 60.6 %   Platelets 328 150 - 400 K/uL   nRBC 0.0 0.0 - 0.2 %   Neutrophils Relative % 54 %   Neutro Abs 6.0 1.7 - 7.7 K/uL   Lymphocytes Relative 29 %   Lymphs Abs 3.2 0.7 - 4.0 K/uL   Monocytes Relative 13 %    Monocytes Absolute 1.4 (H) 0.1 - 1.0 K/uL   Eosinophils Relative 2 %   Eosinophils Absolute 0.2 0.0 - 0.5 K/uL   Basophils Relative 1 %   Basophils Absolute 0.1 0.0 - 0.1 K/uL   Immature Granulocytes 1 %   Abs Immature Granulocytes 0.09 (H) 0.00 - 0.07 K/uL  Troponin I (High Sensitivity)     Status: None   Collection Time: 09/04/20  9:50 PM  Result Value Ref Range   Troponin I (High Sensitivity) 3 <18 ng/L    Imaging Studies: CT Angio Chest PE W/Cm &/Or Wo Cm  Result Date: 09/05/2020 CLINICAL DATA:  Cough and chest discomfort. EXAM: CT ANGIOGRAPHY CHEST WITH CONTRAST TECHNIQUE: Multidetector CT imaging of the chest was performed using the standard protocol during bolus administration of intravenous contrast. Multiplanar CT image reconstructions and MIPs were obtained to evaluate the vascular anatomy. CONTRAST:  OMNIPAQUE IOHEXOL 350 MG/ML SOLN COMPARISON:  July 31, 2020 FINDINGS: Cardiovascular: Satisfactory opacification of the pulmonary arteries to the segmental level. No evidence of pulmonary embolism. Normal heart size. No pericardial effusion. Mediastinum/Nodes: No enlarged mediastinal, hilar, or axillary lymph nodes. Debris is seen within the lumen of the right mainstem bronchus. The thyroid gland demonstrates no significant findings. Lungs/Pleura: Moderate to marked severity areas of airspace disease are seen within the right upper lobe, right middle lobe and right lower lobe. A large, partially loculated right pleural effusion is present. A small left pleural effusion is also seen. No pneumothorax is identified. Upper Abdomen: There is a large, stable hiatal hernia. Multiple adjacent, posterior surgical clips are noted. Musculoskeletal: Degenerative changes are seen throughout the thoracic spine. Review of the MIP images confirms the above findings. IMPRESSION: 1. Moderate to marked severity right upper lobe, right middle lobe and right lower lobe airspace disease. 2. Large, partially  loculated right pleural effusion. 3. Small left pleural effusion. 4. Large hiatal hernia. Electronically Signed   By: Aram Candela M.D.   On: 09/05/2020 00:43   CT Abdomen Pelvis W Contrast  Result Date: 09/05/2020 CLINICAL DATA:  Abdominal pain. EXAM: CT ABDOMEN AND PELVIS WITH CONTRAST TECHNIQUE: Multidetector CT imaging of the abdomen and pelvis was performed using the standard protocol following bolus administration of intravenous contrast. CONTRAST:  OMNIPAQUE IOHEXOL 350 MG/ML SOLN COMPARISON:  April 06, 2019 FINDINGS: Lower chest: Mild to moderate severity areas of atelectasis and/or infiltrate are seen within the bilateral lung bases. There is a large right pleural effusion. A small left pleural effusion is also seen. Hepatobiliary: No focal liver abnormality is seen. Mild central intrahepatic biliary dilatation is noted. A subcentimeter gallstone is seen within the lumen of an otherwise normal-appearing gallbladder. Pancreas: Unremarkable. No pancreatic ductal dilatation or surrounding inflammatory changes. Spleen: Normal in size without focal abnormality. Adrenals/Urinary Tract: Adrenal glands are unremarkable. Kidneys are normal, without renal calculi, focal lesion, or hydronephrosis. Bladder is unremarkable. Stomach/Bowel: There is a large gastric hernia. Multiple adjacent surgical clips are seen. The appendix is not clearly identified. A large amount of stool is seen throughout the large bowel. Two mildly prominent fluid-filled small bowel loops are seen within the mid right abdomen (axial CT images 41 through 46, CT series number 2). Marked severity circumferential thickening of the rectum is noted. Persistent twisting of the mesentery is seen within the mid to lower abdomen, along the midline (axial CT images 42 through 54, CT series number 2). Vascular/Lymphatic: Mild aortic atherosclerosis. No enlarged abdominal or pelvic lymph nodes. Reproductive: Moderate to marked severity  prostate gland enlargement is seen. Other: No abdominal wall hernia or abnormality. No abdominopelvic ascites. Musculoskeletal: Degenerative changes are seen throughout the lumbar spine. IMPRESSION: 1. Rectal wall thickening which may represent sequelae associated with marked severity proctitis. An underlying neoplastic process cannot be excluded. 2. Bibasilar atelectasis and/or infiltrate with bilateral pleural effusions. 3. Mildly prominent small bowel loops within the mid right abdomen. While this may be transient nature, early partial small bowel obstruction cannot be excluded. 4. Large gastric hernia. 5. Cholelithiasis. 6. Enlarged prostate gland. 7. Aortic atherosclerosis. Aortic Atherosclerosis (ICD10-I70.0). Electronically Signed   By: Aram Candela M.D.   On: 09/05/2020 00:37   DG Chest Port 1 View  Result Date: 09/04/2020 CLINICAL DATA:  Cough and chest pressure.  Chest pain. EXAM: PORTABLE CHEST 1 VIEW COMPARISON:  Radiograph 08/03/2020.  CT 07/31/2020 FINDINGS: Patient is rotated due to contracture. Volume loss in the right hemithorax with progressive opacification primarily involving the  lower thorax. This likely represents combination of pleural fluid and airspace disease/atelectasis. No focal left lung abnormalities. Retrocardiac hiatal hernia is only faintly visualized. Heart is grossly normal in size. No pneumothorax. IMPRESSION: Volume loss in the right hemithorax with progressive opacification primarily involving the lower thorax. This likely represents combination of pleural fluid and airspace disease/atelectasis. Electronically Signed   By: Narda Rutherford M.D.   On: 09/04/2020 21:34    1:46 AM Patient's CT scan shows severe right-sided lung disease.  We will go ahead and start Maxipime and vancomycin for possible pneumonia.  He also has a large pleural effusion on that side.  The cause of his abdominal pain is unclear but there are some suggestive changes on his CT scan.  We will  have him admitted to the hospitalist service.     Shauntea Lok, Jonny Ruiz, MD 09/05/20 [phone]

## 2020-09-05 NOTE — Assessment & Plan Note (Signed)
-   noted to be a problem last hospitalization as well; no prior recent abdominal imaging to compare; his pain/distension has a chronic component and may be due to his chronic right side positioning in bed as well - CT shows dilated bowel loops at this time of unclear significance but could not rule out early pSBO - keep NPO for now; if no vomiting by tomorrow could attempt introducing diet slowly pending SLP eval - does have a known hiatal hernia

## 2020-09-05 NOTE — Assessment & Plan Note (Signed)
-   ace wrap still in place on R BKA stump; s/p surgery on 07/27/20

## 2020-09-05 NOTE — Consult Note (Addendum)
Consultation  Referring Provider: TR[facility]/ Dahal  Primary Care Physician:  Harvest Forest, MD Primary Gastroenterologist:  Dr. Orvan Falconer  Reason for Consultation:  abnormal CT of rectum  HPI: [name] is a 60 y.o. male, who was admitted to the hospital yesterday with complaints of worsening cough, nausea and abdominal pain. 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 Patient has history of cerebral palsy, cognitive impairment, microcytic anemia, anxiety bipolar disorder history of decubitus ulcers. He had been seen by Dr. Orvan Falconer in our group in December 2019 after imaging had shown some wall thickening of the stomach on CT.  He had never had prior colonoscopy so he underwent EGD and colonoscopy both in March 2020. EGD showed a tortuous lower third of the esophagus, large hiatal hernia measuring 7 cm, evidence of prior Nissen fundoplication and evidence of prior BII gastrojejunostomy.  He had moderate gastritis. Colonoscopy was found a few diverticuli in the sigmoid colon and nonbleeding internal hemorrhoids.  Otherwise negative exam. Yesterday on CT angio of the chest he was found to have moderate to markedly severe right upper lobe right middle lobe and right lower lobe airspace disease and a large right effusion. CT of the abdomen / pelvis was done because of complaints of abdominal pain and nausea this showed increased stool in the colon to minimally prominent loops of small bowel in the mid abdomen and marked circumferential wall thickening of the rectum.  There is also some chronic persistent twisting of the mesentery in the mid to low abdomen noted.  On admit yesterday WBC 11, hemoglobin 11.6 hematocrit 37.3 MCV of 79 procalcitonin less than 0.1  Patients sister and guardian, [name], at bedside who together with the  patient provided history.    Past Medical History:  Diagnosis Date  . Anemia    microcytic anemia   . Anxiety    takes Ativan daily as needed  . Bipolar disorder (HCC)    takes Depakote daily  . CEREBRAL PALSY 07/24/2006  . Chronic bronchitis (HCC)    "gets it q yr"  . Chronic bronchitis (HCC)   . 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)   . Constipation    takes Miralax daily as needed  . Depression    takes Risperdal nightly  . Environmental allergies    takes Zyrtec daily  . GASTRIC ULCER ACUTE WITH HEMORRHAGE 07/24/2006  . Gastric ulcer with hemorrhage    hx of   . GERD (gastroesophageal reflux disease)    takes Pravacid daily  . History of blood transfusion    no abnormal reaction noted  . History of hiatal hernia   . History of shingles   . History of stomach ulcers ~ 1996  . Hypercholesterolemia   . Intellectual functioning disability   . Intermittent explosive disorder   . Internal hemorrhoids   . MRSA infection 09/06/2019  . Obsessive compulsive disorder   . Osteomyelitis (HCC)    right foot   . Pneumonia "quite a few times"   in 2015 and in June 2016  . Pressure ulcer of sacral region    hx of   . Pseudomonas aeruginosa infection 09/06/2019  . Psychotic disorder (HCC)   . Recurrent UTI (urinary tract infection)   . Seborrheic dermatitis   . Sigmoid diverticulosis   . Spastic tetraplegia (  HCC)   . URINARY INCONTINENCE 02/09/2010   takes Detrol daily-occasionally incontinence  . Vitamin D deficiency   . Weakness    numbness in extremities    Past Surgical History:  Procedure Laterality Date  . AMPUTATION Right 12/14/2014   Procedure: AMPUTATION DIGIT RIGHT GREAT TOE MTP;  Surgeon: Nadara MustardMarcus Duda V, MD;  Location: [facility];  Service: Orthopedics;  Laterality: Right;  . AMPUTATION Right 10/08/2019   Procedure: RIGHT FIFTH RAY AMPUTATION, ADJACENT TISSUE TRANSFER;  Surgeon: Park LiterPrice, Michael J, DPM;  Location: [facility];  Service: Podiatry;   Laterality: Right;  . AMPUTATION Right 07/27/2020   Procedure: AMPUTATION BELOW KNEE;  Surgeon: Toni ArthursHewitt, John, MD;  Location: [facility];  Service: Orthopedics;  Laterality: Right;  60min  . BIOPSY  08/12/2018   Procedure: BIOPSY;  Surgeon: Lynann BolognaGupta, Rajesh, MD;  [facility];  Service: Gastroenterology;;  . COLONOSCOPY WITH PROPOFOL N/A 08/14/2018   Procedure: COLONOSCOPY WITH PROPOFOL;  Surgeon: Lynann BolognaGupta, Rajesh, MD;  Location: [facility];  Service: Gastroenterology;  Laterality: N/A;  . ESOPHAGOGASTRODUODENOSCOPY (EGD) WITH PROPOFOL N/A 08/12/2018   Procedure: ESOPHAGOGASTRODUODENOSCOPY (EGD) WITH PROPOFOL;  Surgeon: Lynann BolognaGupta, Rajesh, MD;  Location: [facility];  Service: Gastroenterology;  Laterality: N/A;  . GRAFT APPLICATION Right 11/12/2019   Procedure: SKIN GRAFT SUBSTITUTE APPLICATION;  Surgeon: Park LiterPrice, Michael J, DPM;  Location: [facility];  Service: Podiatry;  Laterality: Right;  . HERNIA REPAIR    . HIATAL HERNIA REPAIR  1982  . LEG SURGERY Bilateral ~ 1967   "legs were scissored; stretched them out"  . WOUND DEBRIDEMENT Right 11/12/2019   Procedure: DEBRIDEMENT WOUND;  Surgeon: Park LiterPrice, Michael J, DPM;  Location: [facility];  Service: Podiatry;  Laterality: Right;    Prior to Admission medications   Medication Sig Start Date End Date Taking? Authorizing Provider  acetaminophen (TYLENOL) 500 MG tablet Take 500 mg by mouth every 6 (six) hours as needed for moderate pain.   Yes [provider]  albuterol (VENTOLIN HFA) 108 (90 Base) MCG/ACT inhaler Inhale 2 puffs into the lungs every 4 (four) hours as needed for shortness of breath or wheezing. 01/11/20  Yes [provider]  alum & mag hydroxide-simeth (MAALOX/MYLANTA) 200-200-20 MG/5ML suspension Take 30 mLs by mouth every 6 (six) hours as needed for indigestion or heartburn. Use as directed as needed for nausea/indigestion per standing order   Yes [provider]  baclofen (LIORESAL) 20 MG tablet Take 1 tablet (20 mg total)  by mouth 3 (three) times daily. 07/18/16  Yes Kirsteins, Victorino SparrowAndrew E, MD  bisacodyl (DULCOLAX) 10 MG suppository Place 1 suppository (10 mg total) rectally daily as needed for moderate constipation. 08/03/20  Yes Sheikh, Omair Latif, DO  brompheniramine-pseudoephedrine (DIMETAPP) 1-15 MG/5ML ELIX Take 10 mLs by mouth daily as needed (Cold sympton).    Yes [provider]  cetirizine (ZYRTEC) 10 MG tablet Take 1 tablet (10 mg total) by mouth daily. Patient taking differently: Take 10 mg by mouth at bedtime. 11/01/14  Yes Street, Stephanie Couphristopher M, MD  Cholecalciferol (VITAMIN D3) 5000 units CAPS Take 5,000 Units by mouth daily.   Yes [provider]  clotrimazole (LOTRIMIN) 1 % cream Apply 1 application topically 2 (two) times daily.   Yes [provider]  divalproex (DEPAKOTE SPRINKLE) 125 MG capsule Take 500 mg by mouth 2 (two) times daily.    Yes [provider]  fluticasone (FLONASE) 50 MCG/ACT nasal spray Place 1 spray into both nostrils daily. 12/21/19  Yes [provider]  guaiFENesin-dextromethorphan [facility](ROBITUSSIN  DM) 100-10 MG/5ML syrup Take 10 mLs by mouth every 4 (four) hours as needed for cough.    Yes [provider]  hydrocortisone cream 1 % APPLY TOPICALLY AT BEDTIME AS NEEDED FOR ITCHING Patient taking differently: Apply 1 application topically at bedtime. Face 05/29/20  Yes Mullis, Kiersten P, DO  iron polysaccharides (NIFEREX) 150 MG capsule Take 150 mg by mouth daily at 12 noon.    Yes [provider]  ketoconazole (NIZORAL) 2 % shampoo APPLY TOPICALLY TO AFFECTED AREA(S) TWICE WEEKLY AS DIRECTED Patient taking differently: Apply 1 application topically 2 (two) times a week. 12/22/19  Yes Mullis, Kiersten P, DO  LORazepam (ATIVAN) 2 MG tablet Take 1 mg by mouth every 4 (four) hours as needed for anxiety.    Yes [provider]  mupirocin ointment (BACTROBAN) 2 % Apply 1 application topically daily as needed (wound care).   Yes  [provider]  Neomycin-Bacitracin-Polymyxin (TRIPLE ANTIBIOTIC EX) Apply 1 application topically daily as needed (cuts/scrapes/scratches).   Yes [provider]  omeprazole (PRILOSEC) 40 MG capsule Take 1 capsule (40 mg total) by mouth 2 (two) times daily. 08/15/18  Yes Rodolph Bong, MD  ondansetron (ZOFRAN) 4 MG tablet Take 1 tablet (4 mg total) by mouth every 6 (six) hours as needed for nausea. 08/03/20  Yes Sheikh, Omair Latif, DO  Oyster Shell (OYSTER CALCIUM) 500 MG TABS tablet Take 1,500 mg of elemental calcium by mouth daily.   Yes [provider]  polyethylene glycol (MIRALAX) 17 g packet Take 17 g by mouth daily as needed for moderate constipation. Patient taking differently: Take 17 g by mouth daily as needed for moderate constipation. Clearlax power 10/09/19  Yes Kyle, Tyrone A, DO  risperiDONE (RISPERDAL) 2 MG tablet Take 2 mg by mouth at bedtime.   Yes [provider]  senna-docusate (SENOKOT-S) 8.6-50 MG tablet Take 1 tablet by mouth at bedtime. 08/03/20  Yes Sheikh, Omair Latif, DO  tamsulosin (FLOMAX) 0.4 MG CAPS capsule TAKE 1 CAPSULE BY MOUTH EVERY DAY *DO NOT CRUSH* Patient taking differently: Take 0.4 mg by mouth daily. 06/19/20  Yes Mullis, Kiersten P, DO  tolterodine (DETROL LA) 2 MG 24 hr capsule Take 1 capsule (2 mg total) by mouth daily. 04/08/13  Yes Street, Stephanie Coup, MD  triamcinolone cream (KENALOG) 0.1 % Apply 1 application topically 3 (three) times daily. Apply cream to left ankle three time daily 08/21/20  Yes [provider]  vitamin C (VITAMIN C) 250 MG tablet Take 1 tablet (250 mg total) by mouth daily. 01/18/16  Yes Palma Holter, MD  VOLTAREN 1 % GEL APPLY 4 GRAMS TOPICALLY TO AFFECTED AREA(S) FOUR TIMES DAILY Patient taking differently: Apply 4 g topically 4 (four) times daily. 06/16/19  Yes Mullis, Kiersten P, DO  Zinc Oxide 40 % PSTE Apply 1 application topically 2 (two) times daily as needed  (unspecified). Desitin Max strength paste apply thin layer topically to buttocks and scrotum 06/07/20  Yes McDiarmid, Leighton Roach, MD  Foot Care Products (PREVALON HEEL PROTECTOR) MISC 1 Device by Does not apply route daily. Patient not taking: No sig reported 06/20/15   Marquette Saa, MD    Current Facility-Administered Medications  Medication Dose Route Frequency Provider Last Rate Last Admin  . azithromycin (ZITHROMAX) 500 mg in sodium chloride 0.9 % 250 mL IVPB  500 mg Intravenous Q24H Lewie Chamber, MD 250 mL/hr at 09/05/20 0618 500 mg at 09/05/20 0618  . baclofen (LIORESAL) tablet 20 mg  20 mg Oral TID Lorin Glass, MD      . ceFEPIme (MAXIPIME) 2 g in sodium chloride 0.9 % 100 mL IVPB  2 g Intravenous Eliezer Lofts, MD 200 mL/hr at 09/05/20 1054 2 g at 09/05/20 1054  . divalproex (DEPAKOTE SPRINKLE) capsule 500 mg  500 mg Oral BID Lewie Chamber, MD      . enoxaparin (LOVENOX) injection 40 mg  40 mg Subcutaneous Q24H Lewie Chamber, MD   40 mg at 09/05/20 1054  . hydrocerin (EUCERIN) cream   Topical BID Dahal, Binaya, MD      . iron polysaccharides (NIFEREX) capsule 150 mg  150 mg Oral Q1200 Dahal, Binaya, MD      . LORazepam (ATIVAN) tablet 1 mg  1 mg Oral Q4H PRN Lewie Chamber, MD      . metroNIDAZOLE (FLAGYL) IVPB 500 mg  500 mg Intravenous Eliezer Lofts, MD 100 mL/hr at 09/05/20 0856 500 mg at 09/05/20 0856  . pantoprazole (PROTONIX) EC tablet 40 mg  40 mg Oral Daily Dahal, Binaya, MD      . risperiDONE (RISPERDAL) tablet 2 mg  2 mg Oral QHS Lewie Chamber, MD      . senna-docusate (Senokot-S) tablet 1 tablet  1 tablet Oral QHS Dahal, Melina Schools, MD      . tamsulosin (FLOMAX) capsule 0.4 mg  0.4 mg Oral Daily Dahal, Melina Schools, MD        Allergies as of 09/04/2020 - Review Complete 09/04/2020  Allergen Reaction Noted  . Adhesive [tape] Rash 12/21/2010    Family History  Adopted: Yes    Social History   Socioeconomic History  . Marital status: Single    Spouse  name: Not on file  . Number of children: Not on file  . Years of education: Not on file  . Highest education level: Not on file  Occupational History  . Not on file  Tobacco Use  . Smoking status: Never Smoker  . Smokeless tobacco: Never Used  Substance and Sexual Activity  . Alcohol use: No  . Drug use: No  . Sexual activity: Never  Other Topics Concern  . Not on file  Social History Narrative  . Not on file   Social Determinants of Health   Financial Resource Strain: Not on file  Food Insecurity: Not on file  Transportation Needs: Not on file  Physical Activity: Not on file  Stress: Not on file  Social Connections: Not on file  Intimate Partner Violence: Not on file    Review of Systems: Unable to offer  Physical Exam: Vital signs in last 24 hours: Temp:  [97.4 F (36.3 C)-98.3 F (36.8 C)] 98.3 F (36.8 C) (04/12 1152) Pulse Rate:  [76-84] 79 (04/12 1152) Resp:  [11-20] 18 (04/12 1152) BP: (104-130)/(54-87) 118/71 (04/12 1152) SpO2:  [94 %-99 %] 97 % (04/12 1152) Weight:  [98.4 kg] 98.4 kg (04/11 2007) Last BM Date: 09/05/20 General:   Alert, well-developed, well-nourished, cooperative in NAD Head:  Normocephalic and atraumatic. Eyes:  Sclera clear, no icterus.   Conjunctiva pink. Ears:  Normal auditory acuity. Nose:  No deformity, discharge,  or lesions. Mouth:  No deformity or lesions.   Neck:  Supple; no masses or thyromegaly. Lungs:  Clear throughout to auscultation.   No wheezes, crackles, or rhonchi. Heart:  Regular rate and rhythm; no murmurs, clicks, rubs,  or gallops. Abdomen:  Soft,nontender, BS active, nonpalp mass or hsm.   Rectal:  Deferred  Msk:  Symmetrical without gross  deformities. . Pulses:  Normal pulses noted. Extremities:  Without clubbing or edema. S/P right BKA Neurologic:  Alert and oriented x4;  quadraplegic. Skin:  Intact without significant lesions or rashes.. Psych:  Alert and cooperative. Normal mood and  affect.  Intake/Output from previous day: 04/11 0701 - 04/12 0700 In: 254.2 [IV Piggyback:254.2] Out: -  Intake/Output this shift: Total I/O In: -  Out: 700 [Urine:700]  Lab Results: Recent Labs    09/04/20 2150  WBC 11.0*  HGB 11.6*  HCT 37.3*  PLT 328   BMET Recent Labs    09/04/20 2150  NA 141  K 4.1  CL 108  CO2 25  GLUCOSE 81  BUN 16  CREATININE 0.59*  CALCIUM 9.1   LFT Recent Labs    09/04/20 2150  PROT 6.6  ALBUMIN 2.7*  AST 22  ALT 26  ALKPHOS 153*  BILITOT <0.1*     IMPRESSION:   1. Abnormal CT AP showing rectal wall thickening, nonspecific mildly prominent small bowel loops and a large hiatal hernia.  Colonoscopy in March 2020 showed mild sigmoid diverticulosis, internal hemorrhoids. Suspect rectal wall thickening on CT AP represent proctitis related to constipation.   2. Abdominal pain related to PNA, cough, constipation or musculoskeletal.   3. RUL, RML, RLL pneumonia and right pleural effusion.   4. S/P Bilroth II gastrojejunostomy.   5. History of iron deficiency anemia, likely d/t poor iron absorption post BII. Persistent mild microcytic anemia.   6. S/P Nissen fundoplication.   7. S/P right BKA  8. Cerebral palsy, cognitive impairment, quadraplegia   RECOMMENDATION:  1. Flexible sigmoidoscopy when pneumonia has substantially improved  2. Increase Senokot-S to 2 daily. Add Ducolax supp prn. Adjust laxative regimen if constipation persists    3. Consider IV iron replacement in hospital, defer to hospitalist  4. Continue PPI qd for GERD, large hiatal hernia.    Amy Esterwood PA-C 09/05/2020, 1:02 PM     Attending Physician Note   I have taken a history, examined the patient and reviewed the chart. I agree with the Advanced Practitioner's note, impression and recommendations.   Claudette Head, MD FACG [phone]

## 2020-09-05 NOTE — Progress Notes (Signed)
Speech Language Pathology Treatment: Dysphagia  Patient Details Name: [name] MRN: [id number] DOB: [birth year] Today's Date: 09/05/2020 Time: 8588-5027 SLP Time Calculation (min) (ACUTE ONLY): 25 min  Assessment / Plan / Recommendation Clinical Impression  Second session to provide education regarding dysphagia mitigation strategies, feeding tubes not preventing aspiration pneumonia, use of oral suctioning and importance of maintainig strength of cough/expectoration abilities.    Sister [name] present and she reports pt's code status to be DNR and pt having a good QOL being important.  Pt reports some dysphagia with salad - and states they chop it but he continues to cough.  Pt then verbalized "Can I not have salad now?"  SLP informed pt and sister re: increased pulmonary ramifications with aspiration due to Quadriparesis, likely multifactorial dysphagia (swallow on esophagram 07/2020 appeared to trigger at pyriform sinus from review of films), immobility, and current poor positioning.   Pt then states "I can eat what I want" -  suspect regardles s of swallow function, he would not wish for diet modification.    SLP demonstrated use of oral suction to pt and sister = and she returned demonstration.  SLP advised pt only be suctioned if he coughs up secretions to oral cavity as pt noting to request frequent suctioning.  Using teach back with moderate verbal/visual/tactile cues, pt and sister education to general precautions.  Pt requested frequent suctioning from his sister - SLP highly advised pt limit suctioning - as this will contribute to oral dryness and potential oral discomfort.  Will follow up for dysphagia management briefly.    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 Patient resides in a group home.  He was recently hospitalized 3/7 - 3/10 for right sided  PNA discharged with abx to complete course. 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   He presents back to the hospital from his group home due to worsening complaints of cough, coarse breath sounds, abdominal pain, nausea.  Pt is quadriplegic continue risperdal, ativan, depakote  - resume baclofen when able  - resides in a group home; wheelchair and bedbound.  CT angio of chest showed moderate to marked severity of right upper lobe, right middle lobe and right lower lobe airspace disease with large partially loculated right pleural effusion and a large hiatal hernia  CT abdomen pelvis showed marked severity proctitis with possible underlying neoplastic process, mildly prominent small bowel loops, large gastric hernia.  Enlarged prostate.  Pt reports he coughs on occasion with food more than drink and his sister states he is on a soft/thin diet at his group home.      SLP Plan  Continue with current plan of care       Recommendations  Diet recommendations: Thin liquid (clears) Medication Administration: Other (Comment) (whole if small) Supervision: Full supervision/cueing for compensatory strategies Compensations: Slow rate Postural Changes and/or Swallow Maneuvers:  (stay upright as much as possible during and after po)                Oral Care Recommendations: Oral care QID Follow up Recommendations: Other (comment) (TBD) SLP Visit Diagnosis: Dysphagia, unspecified (R13.10) Plan: Continue with current plan of care       GO              [name], MS [facility] SLP Acute Rehab Services Office [phone] Pager [phone]    Ilean Skill,  Lupe Carney 09/05/2020, 1:32 PM

## 2020-09-05 NOTE — Progress Notes (Signed)
A consult was received from an ED physician for Vancomycin per pharmacy dosing.  The patient's profile has been reviewed for ht/wt/allergies/indication/available labs.   A one time order has been placed for Vancomycin 2000mg  IV.  Cefepime also increased to 2gm IV.  Further antibiotics/pharmacy consults should be ordered by admitting physician if indicated.                       Thank you, PharmD 09/05/2020  1:54 AM

## 2020-09-05 NOTE — H&P (Signed)
History and Physical    ANTERRIO MCCLEERY  PPI:[id number]  DOB: 1961/02/08  DOA: 09/04/2020  PCP: Har[place], MD Patient coming from: group home  Chief Complaint: cough, abd pain  HPI:  Mr. 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 Patient resides in a group home. He was recently hospitalized 3/7 - 3/10 for right sided PNA discharged with abx to complete course. 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  He presents back to the hospital from his group home due to worsening complaints of cough, coarse breath sounds, abdominal pain, nausea.  He underwent CTA chest and CT abd/pelvis in the ER.This showed diffuse severe right sided PNA with partially loculated right pleural effusion and small left effusion.  CT abd/pelvis showed rectal wall thickening possibly concerning for proctitis and also showed prominent small bowel loops within the mid right abdomen which were considered possibly transient versus early partial SBO.  He was started on antibiotics and admitted for further work-up and monitoring.  He is a poor historian given his underlying intellectual disability, however from what could be gathered, it does appear that he chronically lays on his right side and may have some issues with swallowing safely at times also.     I have personally briefly reviewed patient's old medical records in [facility] and discussed patient with the ER provider when appropriate/indicated.  Assessment/Plan: * CAP (community acquired pneumonia) - high risk for MDRO given recent abx use, recent hospitalization, chronic debility and resides in a group home - s/p vanc in ER x 1 - continue on cefepime, azithro; add flagyl due to concern for aspiration - check urine legionella and Strep pna - follow up MRSA screen; resume vanc if positive - currently full code and on RA; need to clarify  code status with group home in am - given dense pna and loculated effusion, would benefit from pulm evaluation in am; seems higher risk for invasive intervention but may be necessary - follow up blood cultures - SLP eval due to concern for aspiration events  Abdominal pain - noted to be a problem last hospitalization as well; no prior recent abdominal imaging to compare; his pain/distension has a chronic component and may be due to his chronic right side positioning in bed as well - CT shows dilated bowel loops at this time of unclear significance but could not rule out early pSBO - keep NPO for now; if no vomiting by tomorrow could attempt introducing diet slowly pending SLP eval - does have a known hiatal hernia   Hx of right BKA (HCC) - ace wrap still in place on R BKA stump; s/p surgery on 07/27/20  Microcytic anemia -Baseline hemoglobin 11 to 12 g/dL -Currently at baseline -Continue oral iron pending SLP evaluation  Pressure injury of skin - attempt to turn/rotate in bed; known history of multiple decub ulcers  Cerebral palsy, quadriplegic (HCC) - continue risperdal, ativan, depakote - resume baclofen when able - resides in a group home; wheelchair and bedbound      Code Status:     Code Status: Full Code  DVT Prophylaxis:   enoxaparin (LOVENOX) injection 40 mg Start: 09/05/20 1000   Anticipated disposition is to: Group home  History: Past Medical History:  Diagnosis Date  . Anemia    microcytic anemia   . Anxiety    takes Ativan daily as needed  .  Bipolar disorder (HCC)    takes Depakote daily  . CEREBRAL PALSY 07/24/2006  . Chronic bronchitis (HCC)    "gets it q yr"  . Chronic bronchitis (HCC)   . 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)   . Constipation    takes Miralax daily as needed  . Depression    takes Risperdal nightly  . Environmental allergies    takes Zyrtec daily  . GASTRIC ULCER ACUTE WITH HEMORRHAGE 07/24/2006  . Gastric  ulcer with hemorrhage    hx of   . GERD (gastroesophageal reflux disease)    takes Pravacid daily  . History of blood transfusion    no abnormal reaction noted  . History of hiatal hernia   . History of shingles   . History of stomach ulcers ~ 1996  . Hypercholesterolemia   . Intellectual functioning disability   . Intermittent explosive disorder   . Internal hemorrhoids   . MRSA infection 09/06/2019  . Obsessive compulsive disorder   . Osteomyelitis (HCC)    right foot   . Pneumonia "quite a few times"   in 2015 and in June 2016  . Pressure ulcer of sacral region    hx of   . Pseudomonas aeruginosa infection 09/06/2019  . Psychotic disorder (HCC)   . Recurrent UTI (urinary tract infection)   . Seborrheic dermatitis   . Sigmoid diverticulosis   . Spastic tetraplegia (HCC)   . URINARY INCONTINENCE 02/09/2010   takes Detrol daily-occasionally incontinence  . Vitamin D deficiency   . Weakness    numbness in extremities    Past Surgical History:  Procedure Laterality Date  . AMPUTATION Right 12/14/2014   Procedure: AMPUTATION DIGIT RIGHT GREAT TOE MTP;  Surgeon: Nadara MustardMarcus Duda V, MD;  Location: [facility] OR;  Service: Orthopedics;  Laterality: Right;  . AMPUTATION Right 10/08/2019   Procedure: RIGHT FIFTH RAY AMPUTATION, ADJACENT TISSUE TRANSFER;  Surgeon: Park LiterPrice, Michael J, DPM;  Location: [facility];  Service: Podiatry;  Laterality: Right;  . AMPUTATION Right 07/27/2020   Procedure: AMPUTATION BELOW KNEE;  Surgeon: Toni ArthursHewitt, John, MD;  Location: [facility];  Service: Orthopedics;  Laterality: Right;  60min  . BIOPSY  08/12/2018   Procedure: BIOPSY;  Surgeon: Lynann BolognaGupta, Rajesh, MD;  [facility];  Service: Gastroenterology;;  . COLONOSCOPY WITH PROPOFOL N/A 08/14/2018   Procedure: COLONOSCOPY WITH PROPOFOL;  Surgeon: Lynann BolognaGupta, Rajesh, MD;  Location: [facility];  Service: Gastroenterology;  Laterality: N/A;  . ESOPHAGOGASTRODUODENOSCOPY (EGD) WITH PROPOFOL N/A 08/12/2018   Procedure:  ESOPHAGOGASTRODUODENOSCOPY (EGD) WITH PROPOFOL;  Surgeon: Lynann BolognaGupta, Rajesh, MD;  Location: [facility];  Service: Gastroenterology;  Laterality: N/A;  . GRAFT APPLICATION Right 11/12/2019   Procedure: SKIN GRAFT SUBSTITUTE APPLICATION;  Surgeon: Park LiterPrice, Michael J, DPM;  Location: [facility];  Service: Podiatry;  Laterality: Right;  . HERNIA REPAIR    . HIATAL HERNIA REPAIR  1982  . LEG SURGERY Bilateral ~ 1967   "legs were scissored; stretched them out"  . WOUND DEBRIDEMENT Right 11/12/2019   Procedure: DEBRIDEMENT WOUND;  Surgeon: Park LiterPrice, Michael J, DPM;  Location: [facility];  Service: Podiatry;  Laterality: Right;     reports that he has never smoked. He has never used smokeless tobacco. He reports that he does not drink alcohol and does not use drugs.  Allergies  Allergen Reactions  . Adhesive [Tape] Rash    Family History  Adopted: Yes   Home Medications: Prior to Admission medications   Medication Sig Start Date End Date Taking?  Authorizing Provider  acetaminophen (TYLENOL) 500 MG tablet Take 500 mg by mouth every 6 (six) hours as needed for moderate pain.   Yes [provider]  albuterol (VENTOLIN HFA) 108 (90 Base) MCG/ACT inhaler Inhale 2 puffs into the lungs every 4 (four) hours as needed for shortness of breath or wheezing. 01/11/20  Yes [provider]  alum & mag hydroxide-simeth (MAALOX/MYLANTA) 200-200-20 MG/5ML suspension Take 30 mLs by mouth every 6 (six) hours as needed for indigestion or heartburn. Use as directed as needed for nausea/indigestion per standing order   Yes [provider]  baclofen (LIORESAL) 20 MG tablet Take 1 tablet (20 mg total) by mouth 3 (three) times daily. 07/18/16  Yes Kirsteins, Victorino Sparrow, MD  bisacodyl (DULCOLAX) 10 MG suppository Place 1 suppository (10 mg total) rectally daily as needed for moderate constipation. 08/03/20  Yes Sheikh, Omair Latif, DO  brompheniramine-pseudoephedrine (DIMETAPP) 1-15 MG/5ML ELIX Take 10 mLs by mouth  daily as needed (Cold sympton).    Yes [provider]  cetirizine (ZYRTEC) 10 MG tablet Take 1 tablet (10 mg total) by mouth daily. Patient taking differently: Take 10 mg by mouth at bedtime. 11/01/14  Yes Street, Stephanie Coup, MD  Cholecalciferol (VITAMIN D3) 5000 units CAPS Take 5,000 Units by mouth daily.   Yes [provider]  clotrimazole (LOTRIMIN) 1 % cream Apply 1 application topically 2 (two) times daily.   Yes [provider]  divalproex (DEPAKOTE SPRINKLE) 125 MG capsule Take 500 mg by mouth 2 (two) times daily.    Yes [provider]  fluticasone (FLONASE) 50 MCG/ACT nasal spray Place 1 spray into both nostrils daily. 12/21/19  Yes [provider]  guaiFENesin-dextromethorphan (ROBITUSSIN DM) 100-10 MG/5ML syrup Take 10 mLs by mouth every 4 (four) hours as needed for cough.    Yes [provider]  hydrocortisone cream 1 % APPLY TOPICALLY AT BEDTIME AS NEEDED FOR ITCHING Patient taking differently: Apply 1 application topically at bedtime. Face 05/29/20  Yes Mullis, Kiersten P, DO  iron polysaccharides (NIFEREX) 150 MG capsule Take 150 mg by mouth daily at 12 noon.    Yes [provider]  ketoconazole (NIZORAL) 2 % shampoo APPLY TOPICALLY TO AFFECTED AREA(S) TWICE WEEKLY AS DIRECTED Patient taking differently: Apply 1 application topically 2 (two) times a week. 12/22/19  Yes Mullis, Kiersten P, DO  LORazepam (ATIVAN) 2 MG tablet Take 1 mg by mouth every 4 (four) hours as needed for anxiety.    Yes [provider]  mupirocin ointment (BACTROBAN) 2 % Apply 1 application topically daily as needed (wound care).   Yes [provider]  Neomycin-Bacitracin-Polymyxin (TRIPLE ANTIBIOTIC EX) Apply 1 application topically daily as needed (cuts/scrapes/scratches).   Yes [provider]  omeprazole (PRILOSEC) 40 MG capsule Take 1 capsule (40 mg total) by mouth 2 (two) times daily. 08/15/18  Yes Rodolph Bong, MD   ondansetron (ZOFRAN) 4 MG tablet Take 1 tablet (4 mg total) by mouth every 6 (six) hours as needed for nausea. 08/03/20  Yes Sheikh, Omair Latif, DO  Oyster Shell (OYSTER CALCIUM) 500 MG TABS tablet Take 1,500 mg of elemental calcium by mouth daily.   Yes [provider]  polyethylene glycol (MIRALAX) 17 g packet Take 17 g by mouth daily as needed for moderate constipation. Patient taking differently: Take 17 g by mouth daily as needed for moderate constipation. Clearlax power 10/09/19  Yes Kyle, Tyrone A, DO  risperiDONE (RISPERDAL) 2 MG tablet Take 2 mg by  mouth at bedtime.   Yes [provider]  senna-docusate (SENOKOT-S) 8.6-50 MG tablet Take 1 tablet by mouth at bedtime. 08/03/20  Yes Sheikh, Omair Latif, DO  tamsulosin (FLOMAX) 0.4 MG CAPS capsule TAKE 1 CAPSULE BY MOUTH EVERY DAY *DO NOT CRUSH* Patient taking differently: Take 0.4 mg by mouth daily. 06/19/20  Yes Mullis, Kiersten P, DO  tolterodine (DETROL LA) 2 MG 24 hr capsule Take 1 capsule (2 mg total) by mouth daily. 04/08/13  Yes Street, Stephanie Coup, MD  triamcinolone cream (KENALOG) 0.1 % Apply 1 application topically 3 (three) times daily. Apply cream to left ankle three time daily 08/21/20  Yes [provider]  vitamin C (VITAMIN C) 250 MG tablet Take 1 tablet (250 mg total) by mouth daily. 01/18/16  Yes Palma Holter, MD  VOLTAREN 1 % GEL APPLY 4 GRAMS TOPICALLY TO AFFECTED AREA(S) FOUR TIMES DAILY Patient taking differently: Apply 4 g topically 4 (four) times daily. 06/16/19  Yes Mullis, Kiersten P, DO  Zinc Oxide 40 % PSTE Apply 1 application topically 2 (two) times daily as needed (unspecified). Desitin Max strength paste apply thin layer topically to buttocks and scrotum 06/07/20  Yes McDiarmid, Leighton Roach, MD  Foot Care Products (PREVALON HEEL PROTECTOR) MISC 1 Device by Does not apply route daily. Patient not taking: No sig reported 06/20/15   Marquette Saa, MD    Review of Systems:   Review of systems not obtained due to patient factors. Cognitive impairment   Physical Exam: Vitals:   09/05/20 0030 09/05/20 0130 09/05/20 0230 09/05/20 0300  BP: 111/71 114/66 (!) 119/54 110/67  Pulse: 82 77 82 76  Resp: 12 11 12 20   SpO2: 95% 95% 95% 94%  Weight:      Height:       General appearance: Chronically ill-appearing man laying on his right side with contracted extremities and in no distress Head: Normocephalic, without obvious abnormality, atraumatic Eyes: EOMI Ears: Right ear inspected.  Swelling and excoriation noted on outer ear from pressure.  Right tympanic membrane normal in appearance Lungs: Severely decreased right-sided breath sounds Heart: regular rate and rhythm and S1, S2 normal Abdomen: Difficult to fully examine due to body positioning.  Bowel sounds hypoactive.  Nonspecific tenderness to palpation throughout.  No rebound/guarding Extremities: Right BKA noted.  No obvious edema.  Extremities contracted Skin: normal Neurologic: Follows commands.  Moves arms to command.  Labs on Admission:  I have personally reviewed following labs and imaging studies Results for orders placed or performed during the hospital encounter of 09/04/20 (from the past 24 hour(s))  Comprehensive metabolic panel     Status: Abnormal   Collection Time: 09/04/20  9:50 PM  Result Value Ref Range   Sodium 141 135 - 145 mmol/L   Potassium 4.1 3.5 - 5.1 mmol/L   Chloride 108 98 - 111 mmol/L   CO2 25 22 - 32 mmol/L   Glucose, Bld 81 70 - 99 mg/dL   BUN 16 6 - 20 mg/dL   Creatinine, Ser 11/04/20 (L) 0.61 - 1.24 mg/dL   Calcium 9.1 8.9 - 1.61 mg/dL   Total Protein 6.6 6.5 - 8.1 g/dL   Albumin 2.7 (L) 3.5 - 5.0 g/dL   AST 22 15 - 41 U/L   ALT 26 0 - 44 U/L   Alkaline Phosphatase 153 (H) 38 - 126 U/L   Total Bilirubin <0.1 (L) 0.3 - 1.2 mg/dL   GFR, Estimated 09.6 >04 mL/min   Anion gap  8 5 - 15  Lipase, blood     Status: None   Collection Time: 09/04/20  9:50 PM  Result Value Ref  Range   Lipase 21 11 - 51 U/L  CBC with Differential     Status: Abnormal   Collection Time: 09/04/20  9:50 PM  Result Value Ref Range   WBC 11.0 (H) 4.0 - 10.5 K/uL   RBC 4.68 4.22 - 5.81 MIL/uL   Hemoglobin 11.6 (L) 13.0 - 17.0 g/dL   HCT 16.1 (L) 09.6 - 04.5 %   MCV 79.7 (L) 80.0 - 100.0 fL   MCH 24.8 (L) 26.0 - 34.0 pg   MCHC 31.1 30.0 - 36.0 g/dL   RDW 40.9 (H) 81.1 - 91.4 %   Platelets 328 150 - 400 K/uL   nRBC 0.0 0.0 - 0.2 %   Neutrophils Relative % 54 %   Neutro Abs 6.0 1.7 - 7.7 K/uL   Lymphocytes Relative 29 %   Lymphs Abs 3.2 0.7 - 4.0 K/uL   Monocytes Relative 13 %   Monocytes Absolute 1.4 (H) 0.1 - 1.0 K/uL   Eosinophils Relative 2 %   Eosinophils Absolute 0.2 0.0 - 0.5 K/uL   Basophils Relative 1 %   Basophils Absolute 0.1 0.0 - 0.1 K/uL   Immature Granulocytes 1 %   Abs Immature Granulocytes 0.09 (H) 0.00 - 0.07 K/uL  Troponin I (High Sensitivity)     Status: None   Collection Time: 09/04/20  9:50 PM  Result Value Ref Range   Troponin I (High Sensitivity) 3 <18 ng/L  Resp Panel by RT-PCR (Flu A&B, Covid) Nasopharyngeal Swab     Status: None   Collection Time: 09/05/20  2:02 AM   Specimen: Nasopharyngeal Swab; Nasopharyngeal(NP) swabs in vial transport medium  Result Value Ref Range   SARS Coronavirus 2 by RT PCR NEGATIVE NEGATIVE   Influenza A by PCR NEGATIVE NEGATIVE   Influenza B by PCR NEGATIVE NEGATIVE     Radiological Exams on Admission: CT Angio Chest PE W/Cm &/Or Wo Cm  Result Date: 09/05/2020 CLINICAL DATA:  Cough and chest discomfort. EXAM: CT ANGIOGRAPHY CHEST WITH CONTRAST TECHNIQUE: Multidetector CT imaging of the chest was performed using the standard protocol during bolus administration of intravenous contrast. Multiplanar CT image reconstructions and MIPs were obtained to evaluate the vascular anatomy. CONTRAST:  OMNIPAQUE IOHEXOL 350 MG/ML SOLN COMPARISON:  July 31, 2020 FINDINGS: Cardiovascular: Satisfactory opacification of the  pulmonary arteries to the segmental level. No evidence of pulmonary embolism. Normal heart size. No pericardial effusion. Mediastinum/Nodes: No enlarged mediastinal, hilar, or axillary lymph nodes. Debris is seen within the lumen of the right mainstem bronchus. The thyroid gland demonstrates no significant findings. Lungs/Pleura: Moderate to marked severity areas of airspace disease are seen within the right upper lobe, right middle lobe and right lower lobe. A large, partially loculated right pleural effusion is present. A small left pleural effusion is also seen. No pneumothorax is identified. Upper Abdomen: There is a large, stable hiatal hernia. Multiple adjacent, posterior surgical clips are noted. Musculoskeletal: Degenerative changes are seen throughout the thoracic spine. Review of the MIP images confirms the above findings. IMPRESSION: 1. Moderate to marked severity right upper lobe, right middle lobe and right lower lobe airspace disease. 2. Large, partially loculated right pleural effusion. 3. Small left pleural effusion. 4. Large hiatal hernia. Electronically Signed   By: Aram Candela M.D.   On: 09/05/2020 00:43   CT Abdomen Pelvis W Contrast  Result Date: 09/05/2020 CLINICAL DATA:  Abdominal pain. EXAM: CT ABDOMEN AND PELVIS WITH CONTRAST TECHNIQUE: Multidetector CT imaging of the abdomen and pelvis was performed using the standard protocol following bolus administration of intravenous contrast. CONTRAST:  OMNIPAQUE IOHEXOL 350 MG/ML SOLN COMPARISON:  April 06, 2019 FINDINGS: Lower chest: Mild to moderate severity areas of atelectasis and/or infiltrate are seen within the bilateral lung bases. There is a large right pleural effusion. A small left pleural effusion is also seen. Hepatobiliary: No focal liver abnormality is seen. Mild central intrahepatic biliary dilatation is noted. A subcentimeter gallstone is seen within the lumen of an otherwise normal-appearing gallbladder. Pancreas:  Unremarkable. No pancreatic ductal dilatation or surrounding inflammatory changes. Spleen: Normal in size without focal abnormality. Adrenals/Urinary Tract: Adrenal glands are unremarkable. Kidneys are normal, without renal calculi, focal lesion, or hydronephrosis. Bladder is unremarkable. Stomach/Bowel: There is a large gastric hernia. Multiple adjacent surgical clips are seen. The appendix is not clearly identified. A large amount of stool is seen throughout the large bowel. Two mildly prominent fluid-filled small bowel loops are seen within the mid right abdomen (axial CT images 41 through 46, CT series number 2). Marked severity circumferential thickening of the rectum is noted. Persistent twisting of the mesentery is seen within the mid to lower abdomen, along the midline (axial CT images 42 through 54, CT series number 2). Vascular/Lymphatic: Mild aortic atherosclerosis. No enlarged abdominal or pelvic lymph nodes. Reproductive: Moderate to marked severity prostate gland enlargement is seen. Other: No abdominal wall hernia or abnormality. No abdominopelvic ascites. Musculoskeletal: Degenerative changes are seen throughout the lumbar spine. IMPRESSION: 1. Rectal wall thickening which may represent sequelae associated with marked severity proctitis. An underlying neoplastic process cannot be excluded. 2. Bibasilar atelectasis and/or infiltrate with bilateral pleural effusions. 3. Mildly prominent small bowel loops within the mid right abdomen. While this may be transient nature, early partial small bowel obstruction cannot be excluded. 4. Large gastric hernia. 5. Cholelithiasis. 6. Enlarged prostate gland. 7. Aortic atherosclerosis. Aortic Atherosclerosis (ICD10-I70.0). Electronically Signed   By: Aram Candela M.D.   On: 09/05/2020 00:37   DG Chest Port 1 View  Result Date: 09/04/2020 CLINICAL DATA:  Cough and chest pressure.  Chest pain. EXAM: PORTABLE CHEST 1 VIEW COMPARISON:  Radiograph 08/03/2020.   CT 07/31/2020 FINDINGS: Patient is rotated due to contracture. Volume loss in the right hemithorax with progressive opacification primarily involving the lower thorax. This likely represents combination of pleural fluid and airspace disease/atelectasis. No focal left lung abnormalities. Retrocardiac hiatal hernia is only faintly visualized. Heart is grossly normal in size. No pneumothorax. IMPRESSION: Volume loss in the right hemithorax with progressive opacification primarily involving the lower thorax. This likely represents combination of pleural fluid and airspace disease/atelectasis. Electronically Signed   By: Narda Rutherford M.D.   On: 09/04/2020 21:34   CT Abdomen Pelvis W Contrast  Final Result    CT Angio Chest PE W/Cm &/Or Wo Cm  Final Result    DG Chest [facility]  Final Result      EKG: Independently reviewed. NSR   Lewie Chamber, MD [facility] 09/05/2020, 3:36 AM

## 2020-09-05 NOTE — Consult Note (Addendum)
WOC Nurse Consult Note: Reason for Consult:Left heel pressure injury (recently healed) and left knee full thickness wound (healing). Last seen in the outpatient [facility] by Dr. Leanord Hawking on 08/30/20. Wound type: pressure Pressure Injury POA: Yes Measurement: Heel with area of discoloration measuring 1cm x 2cm with no depth.  Recently healed pressure injury. Left knee with 2cm x 2cm x 0.2cm Stage 3 pressure injury. Pink, moist with scant serous drainage. Amputation in March has improved this wound as it was previously impacted negatively by the right limb's constant pressure. Wound bed:As noted above Drainage (amount, consistency, odor) As noted above Periwound: dry, intact Dressing procedure/placement/frequency: 3-layer bandaging system is removed.  Leg will be moisturized twice daily and foot placed into pressure redistribution heel boot. A mattress replacement is ordered and other prophylactic measures such as a sacral foam dressing and turning and repositioning per protocol are in place. We will use a silicone foam to the left knee and pad the recently healed wound on the heel with an ABD and secure with Kerlix roll gauze.  Patient to return to the outpatient [facility] as directed.  WOC nursing team will not follow, but will remain available to this patient, the nursing and medical teams.  Please re-consult if needed. Thanks, Ladona Mow, MSN, RN, GNP, [place]  Pager# [phone]

## 2020-09-05 NOTE — Progress Notes (Signed)
PROGRESS NOTE  [name]  DOB: 10-09-1960  PCP: [place], MD GGY:[id number]  DOA: 09/04/2020  LOS: 0 days   Chief Complaint  Patient presents with  . Cough   Brief narrative: [name] is a 60 y.o. 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 07/27/2020-patient had right BKA 3/7-3/10, patient was hospitalized for right sided PNA discharged with abx to complete course.  4/11, patient was brought to ED for worsening cough, coarse breath sounds, abdominal pain, nausea.  In the ED, patient was afebrile, hemodynamically stable, breathing room air. Labs with WBC count slightly elevated to 11 RVP negative CT angio of chest showed moderate to marked severity of right upper lobe, right middle lobe and right lower lobe airspace disease with large partially loculated right pleural effusion and a large hiatal hernia CT abdomen pelvis showed marked severity proctitis with possible underlying neoplastic process, mildly prominent small bowel loops, large gastric hernia.  Enlarged prostate. Patient was admitted to hospitalist service.  Subjective: Patient was seen and examined this morning. In bed.  Alert, awake, contracted extremities.  Sister at bedside.  Patient able to answer simple questions.  Severe cognitive impairment per sister. Sister stated that the patient is DNR/DNI.  I updated the chart.  Assessment/Plan: Community-acquired/aspiration pneumonia Diffuse consolidation of right upper, middle and lower lobes Large right pleural effusion -Despite significant findings in the CT chest, patient is breathing comfortably on room air. -Currently on broad-spectrum IV antibiotics for concern of aspiration/community-acquired pneumonia.  -Obtain thoracentesis.  Fluid analysis ordered. -May need pulmonary evaluation as  well. -Speech therapy evaluation pending. -Obtain baseline procalcitonin level and trend. Recent Labs  Lab 09/04/20 2150  WBC 11.0*   Abdominal pain Marked proctitis Large hiatal hernia -CT abdomen and pelvis showed marked proctitis, cannot rule out neoplastic process.  Will discuss with GI for the need of proctosigmoidoscopy.    Hx of right BKA -07/27/2020, patient had right BKA -Stump remains intact.  Continue ace wrap.   Microcytic anemia -Baseline hemoglobin 11 to 12 g/dL -Currently at baseline -Continue oral iron supplement Recent Labs    07/31/20 1204 08/01/20 0321 08/02/20 0306 08/03/20 0335 09/04/20 2150  HGB 12.1* 11.4* 11.0* 12.1* 11.6*  MCV 80.3 80.4 80.3 81.7 79.7*   Pressure injury of skin -POA - attempt to turn/rotate in bed; known history of multiple decub ulcers  Cerebral palsy, quadriplegic - continue risperdal, ativan, depakote, baclofen - resides in a group home; wheelchair and bedbound   Mobility: Bedbound, wheelchair-bound at baseline Code Status:   Code Status: DNR according to patient's sister at bedside Nutritional status: Body mass index is 27.85 kg/m.     Diet Order            Diet NPO time specified Except for: Sips with Meds, Ice Chips  Diet effective now                 DVT prophylaxis: enoxaparin (LOVENOX) injection 40 mg Start: 09/05/20 1000   Antimicrobials:  Broad-spectrum antibiotic coverage Fluid: None Consultants: GI Family Communication:  Sister at bedside  Status is: Inpatient  Remains inpatient appropriate because: Needs thoracentesis, may need proctosigmoidoscopy, needs IV antibiotics  Dispo: The patient is from: Group home              Anticipated d/c is to: Group home Likely in 2 to 3 days  Patient currently is not medically stable to d/c.   Difficult to place patient No       Infusions:  . azithromycin 500 mg (09/05/20 0618)  . ceFEPime (MAXIPIME) IV    . metronidazole 500 mg  (09/05/20 0856)    Scheduled Meds: . divalproex  500 mg Oral BID  . enoxaparin (LOVENOX) injection  40 mg Subcutaneous Q24H  . risperiDONE  2 mg Oral QHS    Antimicrobials: Anti-infectives (From admission, onward)   Start     Dose/Rate Route Frequency Ordered Stop   09/05/20 1000  ceFEPIme (MAXIPIME) 2 g in sodium chloride 0.9 % 100 mL IVPB        2 g 200 mL/hr over 30 Minutes Intravenous Every 8 hours 09/05/20 0258 09/10/20 0959   09/05/20 0400  azithromycin (ZITHROMAX) 500 mg in sodium chloride 0.9 % 250 mL IVPB        500 mg 250 mL/hr over 60 Minutes Intravenous Every 24 hours 09/05/20 0258 09/10/20 0359   09/05/20 0400  metroNIDAZOLE (FLAGYL) IVPB 500 mg        500 mg 100 mL/hr over 60 Minutes Intravenous Every 8 hours 09/05/20 0258     09/05/20 0200  ceFEPIme (MAXIPIME) 2 g in sodium chloride 0.9 % 100 mL IVPB        2 g 200 mL/hr over 30 Minutes Intravenous  Once 09/05/20 0145 09/05/20 0308   09/05/20 0200  vancomycin (VANCOREADY) IVPB 2000 mg/400 mL        2,000 mg 200 mL/hr over 120 Minutes Intravenous  Once 09/05/20 0154 09/05/20 0612      PRN meds: LORazepam   Objective: Vitals:   09/05/20 0400 09/05/20 0750  BP: 106/60 108/65  Pulse: 76 77  Resp: 16 18  Temp: (!) 97.4 F (36.3 C) 98 F (36.7 C)  SpO2: 96% 99%    Intake/Output Summary (Last 24 hours) at 09/05/2020 1049 Last data filed at 09/05/2020 0745 Gross per 24 hour  Intake 254.23 ml  Output 700 ml  Net -445.77 ml   Filed Weights   09/04/20 2007  Weight: 98.4 kg   Weight change:  Body mass index is 27.85 kg/m.   Physical Exam: General exam: Middle-aged Caucasian male with cerebral palsy, quadriplegia at baseline Skin: No rashes, lesions or ulcers. HEENT: Atraumatic, normocephalic, no obvious bleeding Lungs: Right lung with diminished breath sound CVS: Regular rate and rhythm, no murmur GI/Abd soft, nontender, nondistended, bowel sound present CNS: Alert, awake, baseline cognitive  impairment, not restless or agitated Psychiatry: Mood appropriate Extremities: Recent right BKA status, left leg with compression bandage on.  Data Review: I have personally reviewed the laboratory data and studies available.  Recent Labs  Lab 09/04/20 2150  WBC 11.0*  NEUTROABS 6.0  HGB 11.6*  HCT 37.3*  MCV 79.7*  PLT 328   Recent Labs  Lab 09/04/20 2150  NA 141  K 4.1  CL 108  CO2 25  GLUCOSE 81  BUN 16  CREATININE 0.59*  CALCIUM 9.1    F/u labs ordered Unresulted Labs (From admission, onward)          Start     Ordered   09/06/20 0500  Procalcitonin  Daily,   R      09/05/20 1044   09/06/20 0500  Basic metabolic panel  Daily,   R      09/05/20 1044   09/06/20 0500  CBC with Differential/Platelet  Daily,   R  09/05/20 1044   09/06/20 0500  Magnesium  Tomorrow morning,   STAT        09/05/20 1044   09/06/20 0500  Phosphorus  Tomorrow morning,   R        09/05/20 1044   09/05/20 1045  Procalcitonin - Baseline  Add-on,   AD        09/05/20 1044   09/05/20 0749  MRSA PCR Screening  Once,   R        09/05/20 0749   09/05/20 0259  Legionella Pneumophila Serogp 1 Ur Ag  Once,   STAT        09/05/20 0258   09/05/20 0259  Culture, blood (Routine X 2) w Reflex to ID Panel  (Severe pneumonia (requires ICU care) in adults without resistant organism risk factors )  BLOOD CULTURE X 2,   STAT      09/05/20 0258   09/05/20 0259  Culture, sputum-assessment  (Severe pneumonia (requires ICU care) in adults without resistant organism risk factors )  Once,   STAT        09/05/20 0258   09/04/20 2105  Urinalysis, Routine w reflex microscopic  ONCE - STAT,   STAT        09/04/20 2106          Signed, Lorin Glass, MD [facility] 09/05/2020

## 2020-09-05 NOTE — ED Notes (Signed)
Pt. Documented in error see above note in chart. 

## 2020-09-06 ENCOUNTER — Inpatient Hospital Stay (HOSPITAL_COMMUNITY): Payer: Medicare Other

## 2020-09-06 DIAGNOSIS — K5900 Constipation, unspecified: Secondary | ICD-10-CM

## 2020-09-06 DIAGNOSIS — R933 Abnormal findings on diagnostic imaging of other parts of digestive tract: Secondary | ICD-10-CM

## 2020-09-06 LAB — CBC WITH DIFFERENTIAL/PLATELET
Abs Immature Granulocytes: 0.16 10*3/uL — ABNORMAL HIGH (ref 0.00–0.07)
Basophils Absolute: 0.1 10*3/uL (ref 0.0–0.1)
Basophils Relative: 1 %
Eosinophils Absolute: 0.3 10*3/uL (ref 0.0–0.5)
Eosinophils Relative: 3 %
HCT: 35.8 % — ABNORMAL LOW (ref 39.0–52.0)
Hemoglobin: 11.4 g/dL — ABNORMAL LOW (ref 13.0–17.0)
Immature Granulocytes: 2 %
Lymphocytes Relative: 22 %
Lymphs Abs: 1.9 10*3/uL (ref 0.7–4.0)
MCH: 26 pg (ref 26.0–34.0)
MCHC: 31.8 g/dL (ref 30.0–36.0)
MCV: 81.5 fL (ref 80.0–100.0)
Monocytes Absolute: 0.9 10*3/uL (ref 0.1–1.0)
Monocytes Relative: 11 %
Neutro Abs: 5.2 10*3/uL (ref 1.7–7.7)
Neutrophils Relative %: 61 %
Platelets: 322 10*3/uL (ref 150–400)
RBC: 4.39 MIL/uL (ref 4.22–5.81)
RDW: 18.3 % — ABNORMAL HIGH (ref 11.5–15.5)
WBC: 8.5 10*3/uL (ref 4.0–10.5)
nRBC: 0 % (ref 0.0–0.2)

## 2020-09-06 LAB — BODY FLUID CELL COUNT WITH DIFFERENTIAL
Lymphs, Fluid: 59 %
Monocyte-Macrophage-Serous Fluid: 19 % — ABNORMAL LOW (ref 50–90)
Neutrophil Count, Fluid: 22 % (ref 0–25)
Total Nucleated Cell Count, Fluid: 5995 cu mm — ABNORMAL HIGH (ref 0–1000)

## 2020-09-06 LAB — BASIC METABOLIC PANEL
Anion gap: 9 (ref 5–15)
BUN: 13 mg/dL (ref 6–20)
CO2: 22 mmol/L (ref 22–32)
Calcium: 8.6 mg/dL — ABNORMAL LOW (ref 8.9–10.3)
Chloride: 105 mmol/L (ref 98–111)
Creatinine, Ser: 0.47 mg/dL — ABNORMAL LOW (ref 0.61–1.24)
GFR, Estimated: >60 mL/min (ref >60)
Glucose, Bld: 94 mg/dL (ref 70–99)
Potassium: 3.9 mmol/L (ref 3.5–5.1)
Sodium: 136 mmol/L (ref 135–145)

## 2020-09-06 LAB — GLUCOSE, PLEURAL OR PERITONEAL FLUID: Glucose, Fluid: 93 mg/dL

## 2020-09-06 LAB — PHOSPHORUS: Phosphorus: 4.2 mg/dL (ref 2.5–4.6)

## 2020-09-06 LAB — LACTATE DEHYDROGENASE, PLEURAL OR PERITONEAL FLUID: LD, Fluid: 106 U/L — ABNORMAL HIGH (ref 3–23)

## 2020-09-06 LAB — PROTEIN, PLEURAL OR PERITONEAL FLUID: Total protein, fluid: 3.7 g/dL

## 2020-09-06 LAB — URINALYSIS, ROUTINE W REFLEX MICROSCOPIC
Bilirubin Urine: NEGATIVE
Glucose, UA: NEGATIVE mg/dL
Hgb urine dipstick: NEGATIVE
Ketones, ur: 20 mg/dL — AB
Leukocytes,Ua: NEGATIVE
Nitrite: NEGATIVE
Protein, ur: NEGATIVE mg/dL
Specific Gravity, Urine: 1.015 (ref 1.005–1.030)
pH: 6 (ref 5.0–8.0)

## 2020-09-06 LAB — MAGNESIUM: Magnesium: 1.9 mg/dL (ref 1.7–2.4)

## 2020-09-06 LAB — ALBUMIN, PLEURAL OR PERITONEAL FLUID: Albumin, Fluid: 1.9 g/dL

## 2020-09-06 LAB — PROCALCITONIN: Procalcitonin: <0.1 ng/mL

## 2020-09-06 MED ORDER — POLYETHYLENE GLYCOL 3350 17 G PO PACK
17.0000 g | PACK | Freq: Every day | ORAL | Status: DC
  Administered 2020-09-06: 17 g via ORAL
  Filled 2020-09-06 (×2): qty 1

## 2020-09-06 MED ORDER — SODIUM CHLORIDE 0.9 % IV SOLN
3.0000 g | Freq: Four times a day (QID) | INTRAVENOUS | Status: DC
  Administered 2020-09-06 – 2020-09-07 (×4): 3 g via INTRAVENOUS
  Filled 2020-09-06 (×2): qty 3
  Filled 2020-09-06: qty 8
  Filled 2020-09-06: qty 3
  Filled 2020-09-06 (×2): qty 8

## 2020-09-06 MED ORDER — LIDOCAINE HCL 1 % IJ SOLN
INTRAMUSCULAR | Status: AC
  Filled 2020-09-06: qty 20

## 2020-09-06 NOTE — Progress Notes (Addendum)
Patient ID: [name], male   DOB: 1960-06-01, 60 y.o.   MRN: [id number]    Progress Note   Subjective  Day #2 CC: extensive right-sided pneumonia with effusion, abnormal CT of the abdomen, pelvis with concern for marked proctitis.  WBC 8.5/hemoglobin 11.4/hematocrit 35.8  Maxipime/Flagyl  Patient says he has not had a bowel movement, when asked complains of right lower quadrant abdominal discomfort "not bad", no rectal pain or discomfort    Objective   Vital signs in last 24 hours: Temp:  [97.7 F (36.5 C)-98.3 F (36.8 C)] 97.7 F (36.5 C) (04/13 0420) Pulse Rate:  [70-84] 84 (04/13 0420) Resp:  [18-20] 20 (04/13 0420) BP: (116-125)/(71-76) 116/76 (04/13 0420) SpO2:  [91 %-97 %] 91 % (04/13 0420) Last BM Date: 09/05/20 General:   Older white male in NAD, cerebral palsy Heart:  Regular rate and rhythm; no murmurs Lungs: Respirations even and unlabored, scattered rhonchi on the right Abdomen:  Soft, nontender and nondistended. Normal bowel sounds. Extremities:  Without edema. Neurologic:  Alert and oriented,  grossly normal neurologically. Psych:  Cooperative. Normal mood and affect.  Intake/Output from previous day: 04/12 0701 - 04/13 0700 In: 1137 [IV Piggyback:1137] Out: 1150 [Urine:1150] Intake/Output this shift: No intake/output data recorded.  Lab Results: Recent Labs    09/04/20 2150 09/06/20 0526  WBC 11.0* 8.5  HGB 11.6* 11.4*  HCT 37.3* 35.8*  PLT 328 322   BMET Recent Labs    09/04/20 2150 09/06/20 0526  NA 141 136  K 4.1 3.9  CL 108 105  CO2 25 22  GLUCOSE 81 94  BUN 16 13  CREATININE 0.59* 0.47*  CALCIUM 9.1 8.6*   LFT Recent Labs    09/04/20 2150  PROT 6.6  ALBUMIN 2.7*  AST 22  ALT 26  ALKPHOS 153*  BILITOT <0.1*   PT/INR No results for input(s): LABPROT, INR in the last 72 hours.  Studies/Results: CT Angio Chest PE W/Cm &/Or Wo Cm  Result Date: 09/05/2020 CLINICAL DATA:  Cough and chest discomfort. EXAM: CT  ANGIOGRAPHY CHEST WITH CONTRAST TECHNIQUE: Multidetector CT imaging of the chest was performed using the standard protocol during bolus administration of intravenous contrast. Multiplanar CT image reconstructions and MIPs were obtained to evaluate the vascular anatomy. CONTRAST:  OMNIPAQUE IOHEXOL 350 MG/ML SOLN COMPARISON:  July 31, 2020 FINDINGS: Cardiovascular: Satisfactory opacification of the pulmonary arteries to the segmental level. No evidence of pulmonary embolism. Normal heart size. No pericardial effusion. Mediastinum/Nodes: No enlarged mediastinal, hilar, or axillary lymph nodes. Debris is seen within the lumen of the right mainstem bronchus. The thyroid gland demonstrates no significant findings. Lungs/Pleura: Moderate to marked severity areas of airspace disease are seen within the right upper lobe, right middle lobe and right lower lobe. A large, partially loculated right pleural effusion is present. A small left pleural effusion is also seen. No pneumothorax is identified. Upper Abdomen: There is a large, stable hiatal hernia. Multiple adjacent, posterior surgical clips are noted. Musculoskeletal: Degenerative changes are seen throughout the thoracic spine. Review of the MIP images confirms the above findings. IMPRESSION: 1. Moderate to marked severity right upper lobe, right middle lobe and right lower lobe airspace disease. 2. Large, partially loculated right pleural effusion. 3. Small left pleural effusion. 4. Large hiatal hernia. Electronically Signed   By: Aram Candela M.D.   On: 09/05/2020 00:43   CT Abdomen Pelvis W Contrast  Result Date: 09/05/2020 CLINICAL DATA:  Abdominal pain. EXAM: CT ABDOMEN AND PELVIS  WITH CONTRAST TECHNIQUE: Multidetector CT imaging of the abdomen and pelvis was performed using the standard protocol following bolus administration of intravenous contrast. CONTRAST:  OMNIPAQUE IOHEXOL 350 MG/ML SOLN COMPARISON:  April 06, 2019 FINDINGS: Lower  chest: Mild to moderate severity areas of atelectasis and/or infiltrate are seen within the bilateral lung bases. There is a large right pleural effusion. A small left pleural effusion is also seen. Hepatobiliary: No focal liver abnormality is seen. Mild central intrahepatic biliary dilatation is noted. A subcentimeter gallstone is seen within the lumen of an otherwise normal-appearing gallbladder. Pancreas: Unremarkable. No pancreatic ductal dilatation or surrounding inflammatory changes. Spleen: Normal in size without focal abnormality. Adrenals/Urinary Tract: Adrenal glands are unremarkable. Kidneys are normal, without renal calculi, focal lesion, or hydronephrosis. Bladder is unremarkable. Stomach/Bowel: There is a large gastric hernia. Multiple adjacent surgical clips are seen. The appendix is not clearly identified. A large amount of stool is seen throughout the large bowel. Two mildly prominent fluid-filled small bowel loops are seen within the mid right abdomen (axial CT images 41 through 46, CT series number 2). Marked severity circumferential thickening of the rectum is noted. Persistent twisting of the mesentery is seen within the mid to lower abdomen, along the midline (axial CT images 42 through 54, CT series number 2). Vascular/Lymphatic: Mild aortic atherosclerosis. No enlarged abdominal or pelvic lymph nodes. Reproductive: Moderate to marked severity prostate gland enlargement is seen. Other: No abdominal wall hernia or abnormality. No abdominopelvic ascites. Musculoskeletal: Degenerative changes are seen throughout the lumbar spine. IMPRESSION: 1. Rectal wall thickening which may represent sequelae associated with marked severity proctitis. An underlying neoplastic process cannot be excluded. 2. Bibasilar atelectasis and/or infiltrate with bilateral pleural effusions. 3. Mildly prominent small bowel loops within the mid right abdomen. While this may be transient nature, early partial small bowel  obstruction cannot be excluded. 4. Large gastric hernia. 5. Cholelithiasis. 6. Enlarged prostate gland. 7. Aortic atherosclerosis. Aortic Atherosclerosis (ICD10-I70.0). Electronically Signed   By: Aram Candela M.D.   On: 09/05/2020 00:37   DG CHEST PORT 1 VIEW  Result Date: 09/06/2020 CLINICAL DATA:  Pneumonia EXAM: PORTABLE CHEST 1 VIEW COMPARISON:  09/04/2020 FINDINGS: Bilateral airspace disease, right greater than left, worsening on the left since prior study, likely stable on the right. Study is limited due to positioning and rotation. Heart appears mildly enlarged. Suspect layering right pleural effusion. IMPRESSION: Bilateral airspace disease, right greater than left, increasing on the left since prior study. Layering right effusion. Electronically Signed   By: Charlett Nose M.D.   On: 09/06/2020 08:07   DG Chest Port 1 View  Result Date: 09/04/2020 CLINICAL DATA:  Cough and chest pressure.  Chest pain. EXAM: PORTABLE CHEST 1 VIEW COMPARISON:  Radiograph 08/03/2020.  CT 07/31/2020 FINDINGS: Patient is rotated due to contracture. Volume loss in the right hemithorax with progressive opacification primarily involving the lower thorax. This likely represents combination of pleural fluid and airspace disease/atelectasis. No focal left lung abnormalities. Retrocardiac hiatal hernia is only faintly visualized. Heart is grossly normal in size. No pneumothorax. IMPRESSION: Volume loss in the right hemithorax with progressive opacification primarily involving the lower thorax. This likely represents combination of pleural fluid and airspace disease/atelectasis. Electronically Signed   By: Narda Rutherford M.D.   On: 09/04/2020 21:34      Assessment / Plan:    #56 59 year old white male with cerebral palsy admitted with extensive right-sided pneumonia and large right pleural effusion  #2 abdominal pain-nonspecific and nontender on exam.  This may be secondary to pneumonia, and/or musculoskeletal. No  bowel movement in 2 days  Findings of rectal wall thickening on CT concerning for proctitis.  This may be secondary to constipation. Prior colonoscopy March 2020 showed mild sigmoid diverticulosis internal hemorrhoids and no evidence of proctitis or colitis.  #3 status post Billroth II gastrojejunostomy #4 History of iron deficiency anemia likely secondary to prior Billroth II #5 status post Nissen fundoplication #6 status post right BKA recent #7 cognitive impairment and quadriplegia  Plan:  Continue Senokot 2 daily Dulcolax suppository as needed Add MiraLAX 17 g in 8 ounces of water daily Defer potential iron replacement IV to hospitalist  We will plan flexible sigmoidoscopy, which will require sedation. Please call us back when his pneumonia has resolved and we will schedule, otherwise will plan to see again on Monday, 09/11/2020. We can arrange flex sigmoidoscopy as an outpatient if he is discharged before his pneumonia has resolved.   Addendum-Discussed further with hospitalist and Dr. Russella Dar.  Dr. Pola Corn feels that patient was stable to undergo sigmoidoscopy from a pulmonary standpoint.  As this is a semielective procedure, Dr. Russella Dar feels that patient should clear his pneumonia prior to sedation for sigmoidoscopy.  Patient has been scheduled for outpatient office visit with Dr. [name] on 11/06/2020 at 10:30 AM.  [facility]    LOS: 1 day   Sireen Halk EsterwoodPA-C  09/06/2020, 9:13 AM      Attending Physician Note   I have taken an interval history, reviewed the chart and examined the patient. I agree with the Advanced Practitioner's note, impression and recommendations.   Claudette Head, MD FACG [phone]

## 2020-09-06 NOTE — Procedures (Signed)
Ultrasound-guided diagnostic and therapeutic right thoracentesis performed yielding 520 cc of slightly hazy, yellow fluid. No immediate complications. Follow-up chest x-ray pending.The fluid was sent to the lab for preordered studies. EBL< 3 cc.

## 2020-09-06 NOTE — Progress Notes (Addendum)
PROGRESS NOTE  [name]  DOB: [birth year]  PCP: Harvest Forest, MD HKV:[id number]  DOA: 09/04/2020  LOS: 1 day   Chief Complaint  Patient presents with  . Cough   Brief narrative: [name] is a 60 y.o. 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 07/27/2020-patient had right BKA 3/7-3/10, patient was hospitalized for right sided PNA discharged with abx to complete course.  4/11, patient was brought to ED for worsening cough, coarse breath sounds, abdominal pain, nausea.  In the ED, patient was afebrile, hemodynamically stable, breathing room air. Labs with WBC count slightly elevated to 11 RVP negative CT angio of chest showed moderate to marked severity of right upper lobe, right middle lobe and right lower lobe airspace disease with large partially loculated right pleural effusion and a large hiatal hernia CT abdomen pelvis showed marked severity proctitis with possible underlying neoplastic process, mildly prominent small bowel loops, large gastric hernia.  Enlarged prostate. Patient was admitted to hospitalist service. See below for details  Subjective: Patient was seen and examined this morning. Lying down in bed.  Alert, awake, not in distress.  Sister not at bedside today.  Assessment/Plan: Community-acquired/aspiration pneumonia Diffuse consolidation of right upper, middle and lower lobes Large right pleural effusion -Despite significant findings in the CT chest, patient respiratory status was stable and he is not requiring supplemental oxygen.  -Currently on broad-spectrum IV antibiotics for concern of aspiration/community-acquired pneumonia.  -Today, 4/13, patient underwent thoracentesis with removal of 520 mL of slightly hazy yellow fluid.  Postthoracentesis, chest x-ray shows significant  improvement. -Pending pleural fluid analysis. Recent Labs  Lab 09/04/20 2150 09/05/20 0547 09/06/20 0526  WBC 11.0*  --  8.5  PROCALCITON  --  <0.10 <0.10   Chronic dysphagia/aspiration -Speech therapy evaluation obtained. -Patient has trouble clearing his secretions.  He is at risk of recurrent aspiration.  Pured diet with thin liquid.  Marked proctitis Large hiatal hernia -Complains of abdominal pain.   -CT abdomen and pelvis showed marked proctitis, cannot rule out neoplastic process.   -GI consultation was obtained.  Proctitis likely secondary to constipation.   -Currently on Senokot scheduled, Dulcolax separatory as needed, MiraLAX as needed  -Proctosigmoidoscopy as an outpatient per GI.  History of Billroth II gastrojejunostomy History of Nissen fundoplication History of chronic iron deficiency anemia secondary to prior Billroth II -Hemoglobin at baseline currently  Hx of right BKA -07/27/2020, patient had right BKA -Stump remains intact.  Continue ace wrap.   Pressure injury of skin -POA - attempt to turn/rotate in bed; known history of multiple decub ulcers  Cerebral palsy, quadriplegic - continue risperdal, ativan, depakote, baclofen - resides in a group home; wheelchair and bedbound   Mobility: Bedbound, wheelchair-bound at baseline Code Status:   Code Status: DNR according to patient's sister at bedside Nutritional status: Body mass index is 27.85 kg/m.     Diet Order            DIET - DYS 1 Room service appropriate? Yes; Fluid consistency: Thin  Diet effective now                 DVT prophylaxis: enoxaparin (LOVENOX) injection 40 mg Start: 09/05/20 1000   Antimicrobials:  Switch antibiotic to Unasyn and azithromycin. Fluid: None Consultants: GI Family Communication:  Sister not at bedside today.  Status is: Inpatient  Remains  inpatient appropriate because: May need proctosigmoidoscopy Dispo: The patient is from: Group home               Anticipated d/c is to: Group home Likely in 1 to 2 days              Patient currently is not medically stable to d/c.   Difficult to place patient No       Infusions:  . azithromycin 500 mg (09/06/20 0355)    Scheduled Meds: . baclofen  20 mg Oral TID  . divalproex  500 mg Oral BID  . enoxaparin (LOVENOX) injection  40 mg Subcutaneous Q24H  . hydrocerin   Topical BID  . iron polysaccharides  150 mg Oral Q1200  . lidocaine      . pantoprazole  40 mg Oral Daily  . polyethylene glycol  17 g Oral Daily  . risperiDONE  2 mg Oral QHS  . senna-docusate  2 tablet Oral QHS  . tamsulosin  0.4 mg Oral QPC supper    Antimicrobials: Anti-infectives (From admission, onward)   Start     Dose/Rate Route Frequency Ordered Stop   09/05/20 1000  ceFEPIme (MAXIPIME) 2 g in sodium chloride 0.9 % 100 mL IVPB  Status:  Discontinued        2 g 200 mL/hr over 30 Minutes Intravenous Every 8 hours 09/05/20 0258 09/06/20 1342   09/05/20 0400  azithromycin (ZITHROMAX) 500 mg in sodium chloride 0.9 % 250 mL IVPB        500 mg 250 mL/hr over 60 Minutes Intravenous Every 24 hours 09/05/20 0258 09/10/20 0359   09/05/20 0400  metroNIDAZOLE (FLAGYL) IVPB 500 mg  Status:  Discontinued        500 mg 100 mL/hr over 60 Minutes Intravenous Every 8 hours 09/05/20 0258 09/06/20 1342   09/05/20 0200  ceFEPIme (MAXIPIME) 2 g in sodium chloride 0.9 % 100 mL IVPB        2 g 200 mL/hr over 30 Minutes Intravenous  Once 09/05/20 0145 09/05/20 0308   09/05/20 0200  vancomycin (VANCOREADY) IVPB 2000 mg/400 mL        2,000 mg 200 mL/hr over 120 Minutes Intravenous  Once 09/05/20 0154 09/05/20 0612      PRN meds: bisacodyl, LORazepam, morphine injection   Objective: Vitals:   09/06/20 1053 09/06/20 1125  BP: 118/80 125/76  Pulse:    Resp:    Temp:    SpO2:      Intake/Output Summary (Last 24 hours) at 09/06/2020 1342 Last data filed at 09/06/2020 0815 Gross per 24 hour  Intake 1137.03 ml  Output 775 ml   Net 362.03 ml   Filed Weights   09/04/20 2007  Weight: 98.4 kg   Weight change:  Body mass index is 27.85 kg/m.   Physical Exam: General exam: Middle-aged Caucasian male with cerebral palsy, quadriplegia at baseline Skin: No rashes, lesions or ulcers. HEENT: Atraumatic, normocephalic, no obvious bleeding Lungs: Clear to auscultation bilaterally.  Not on supplemental oxygen CVS: Regular rate and rhythm, no murmur GI/Abd soft, nontender, nondistended, bowel sound present CNS: Alert, awake, baseline cognitive impairment, not restless or agitated Psychiatry: Mood appropriate Extremities: Recent right BKA status, left leg with compression bandage on.  Data Review: I have personally reviewed the laboratory data and studies available.  Recent Labs  Lab 09/04/20 2150 09/06/20 0526  WBC 11.0* 8.5  NEUTROABS 6.0 5.2  HGB 11.6* 11.4*  HCT 37.3* 35.8*  MCV 79.7* 81.5  PLT 328 322   Recent Labs  Lab 09/04/20 2150 09/06/20 0526  NA 141 136  K 4.1 3.9  CL 108 105  CO2 25 22  GLUCOSE 81 94  BUN 16 13  CREATININE 0.59* 0.47*  CALCIUM 9.1 8.6*  MG  --  1.9  PHOS  --  4.2    F/u labs ordered Unresulted Labs (From admission, onward)          Start     Ordered   09/06/20 1136  Body fluid culture w Gram Stain  RELEASE UPON ORDERING,   TIMED        09/06/20 1136   09/06/20 0500  Procalcitonin  Daily,   R      09/05/20 1044   09/06/20 0500  Basic metabolic panel  Daily,   R      09/05/20 1044   09/06/20 0500  CBC with Differential/Platelet  Daily,   R      09/05/20 1044   09/05/20 0749  MRSA PCR Screening  Once,   R        09/05/20 0749   09/05/20 0259  Legionella Pneumophila Serogp 1 Ur Ag  Once,   STAT        09/05/20 0258   09/05/20 0259  Culture, sputum-assessment  (Severe pneumonia (requires ICU care) in adults without resistant organism risk factors )  Once,   STAT        09/05/20 0258   09/04/20 2105  Urinalysis, Routine w reflex microscopic  ONCE - STAT,   STAT         09/04/20 2106          Signed, Lorin Glass, MD [facility] 09/06/2020

## 2020-09-06 NOTE — Progress Notes (Signed)
Speech Language Pathology Treatment: Dysphagia  Patient Details Name: [name] MRN: [id number] DOB: [birth year] Today's Date: 09/06/2020 Time: 7322-0254 SLP Time Calculation (min) (ACUTE ONLY): 25 min  Assessment / Plan / Recommendation Clinical Impression  Skilled SLP treatment to determine if pt ready for dietary advancement and for potential for MBS - per SLP discussion with MD.  Pt found in bed side lying with redness of ear on right (RN aware) and and slight drooling.  He was agreeable to consume po intake- positioned bed in reverse trendelenburg for optimal position for po intake.  Pt consumed soda via straw x4 boluses, graham crackers x3, and puree.  His mastication ability appeared to be rotary and swallow clinically was timely - however pt did require modertate verbal cue to cease talking with po in his mouth.  Minimal cough after swallow of jello - after which SLP cued pt to cough and expectorate. Jello noted to be mixed with secretons - ? if oral or pharyngeal retention.  After snack, delayed cough present and with cues pt able to propel frothy secretions to anterior oral cavity for clearance- causing SLP to question primary esophageal source. Overall, positioning, management of secretions and suspected primary esophageal dysphagia appear largest risk factors for aspiration pnas - however pt has not had an MBS completed and though position may be suboptimal - it would provide view of oropharyngeal swallow.  Messaged MD with rec to advance diet to dys3/thin - and plan for [facility] 09/07/2020.  Informed RN of recommendations and posted swallow precaution signs in room.    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 Patient resides in a group home.  He was recently hospitalized 3/7 - 3/10 for right sided PNA discharged with abx to complete course. He also had R BKA on  07/27/20.   He presents back to the hospital from his group home due to worsening complaints of cough, coarse breath sounds, abdominal pain, nausea.  Pt is quadriplegic continue risperdal, ativan, depakote  - resume baclofen when able  - resides in a group home; wheelchair and bedbound.  CT angio of chest showed moderate to marked severity of right upper lobe, right middle lobe and right lower lobe airspace disease with large partially loculated right pleural effusion and a large hiatal hernia  CT abdomen pelvis showed marked severity proctitis with possible underlying neoplastic process, mildly prominent small bowel loops, large gastric hernia.  Enlarged prostate.  Pt reports he coughs on occasion with food more than drink and his sister states he is on a soft/thin diet at his group home.      SLP Plan  MBS (4/14 per SLP discussion withMD)       Recommendations  Diet recommendations: Dysphagia 3 (mechanical soft);Thin liquid Liquids provided via: Straw Medication Administration: Other (Comment) (whole if small) Supervision: Full supervision/cueing for compensatory strategies Compensations: Slow rate;Small sips/bites Postural Changes and/or Swallow Maneuvers: Upright 30-60 min after meal (reverse trendelenberg)                Oral Care Recommendations: Oral care QID Follow up Recommendations: Other (comment) (TBD) SLP Visit Diagnosis: Dysphagia, unspecified (R13.10) Plan: MBS (4/14 per SLP discussion withMD)       GO                [name] 09/06/2020, 6:07 PM  [name], MS [facility] SLP Acute  Rehab Services Office [phone] Pager [phone]

## 2020-09-06 NOTE — TOC Initial Note (Signed)
Transition of Care [facility]) - Initial/Assessment Note    Patient Details  Name: [name] MRN: [id number] Date of Birth: 27-Jan-1961  [facility] CM/SW Contact:    Ida Rogue, LCSW Phone Number: 09/06/2020, 12:30 PM  Clinical Narrative:   Called patient's sister Monna Fam as she is his guardian.  She states he has lived in the same [facility] for 30 years, and she is very happy with the care he receives.  He has a IT trainer through [employer].  Her name is Mardella Layman and number is [phone].  We are to call [facility] at [phone] to let them know when he is stable for d/c, and send him back via PTAR.  No further needs identified.  TOC will continue to follow during the course of hospitalization.                Expected Discharge Plan: Group Home Barriers to Discharge: No Barriers Identified   Patient Goals and CMS Choice        Expected Discharge Plan and Services Expected Discharge Plan: Group Home                                              Prior Living Arrangements/Services                       Activities of Daily Living Home Assistive Devices/Equipment: Wheelchair ADL Screening (condition at time of admission) Patient's cognitive ability adequate to safely complete daily activities?: No Is the patient deaf or have difficulty hearing?: No Does the patient have difficulty seeing, even when wearing glasses/contacts?: No Does the patient have difficulty concentrating, remembering, or making decisions?: No Patient able to express need for assistance with ADLs?: Yes Does the patient have difficulty dressing or bathing?: Yes Independently performs ADLs?: No Communication: Independent Dressing (OT): Dependent Is this a change from baseline?: Pre-admission baseline Grooming: Dependent Is this a change from baseline?: Pre-admission baseline Feeding: Dependent Is this a change from baseline?: Pre-admission baseline Bathing: Dependent Is  this a change from baseline?: Pre-admission baseline Toileting: Dependent Is this a change from baseline?: Pre-admission baseline In/Out Bed: Dependent Is this a change from baseline?: Pre-admission baseline Walks in Home: Dependent Is this a change from baseline?: Pre-admission baseline Does the patient have difficulty walking or climbing stairs?: Yes Weakness of Legs: Both Weakness of Arms/Hands: Both  Permission Sought/Granted                  Emotional Assessment              Admission diagnosis:  CAP (community acquired pneumonia) [J18.9] Pleural effusion on left [J90] Pleural effusion on right [J90] Community acquired pneumonia of right upper lobe of lung [J18.9] Community acquired pneumonia of right middle lobe of lung [J18.9] Community acquired pneumonia of right lower lobe of lung [J18.9] Left lower quadrant abdominal pain [R10.32] Patient Active Problem List   Diagnosis Date Noted  . 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 Nausea and vomiting 07/31/2020  . 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 Osteomyelitis (HCC) 10/08/2019  . Acute osteomyelitis of right ankle or foot (HCC)   . Ulcerated, foot, right, with necrosis of bone (HCC)   . Pseudomonas aeruginosa infection 09/06/2019  . MRSA infection 09/06/2019  . Lactic acidosis   .  Pressure injury of skin 08/10/2018  . Gastric wall thickening 08/10/2018  . Alkaline phosphatase elevation 08/10/2018  . Cholelithiasis 08/10/2018  . Microcytic anemia   . Hiatal hernia 04/07/2018  . 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 Quadriplegia (HCC) 12/15/2015  . Cerebral palsy, quadriplegic (HCC)   . Pre-diabetes 08/31/2015  . Foot ulcer, limited to breakdown of skin (HCC) 06/08/2015  . Seasonal allergies 11/01/2014  . Abdominal pain   . Pressure ulcer of foot 03/14/2014  . CAP (community acquired pneumonia) 03/13/2014  . Pressure ulcer 01/08/2013  . URINARY  INCONTINENCE 02/09/2010  . Mental retardation 07/24/2006  . Infantile cerebral palsy (HCC) 07/24/2006   PCP:  Harvest Forest, MD Pharmacy:   [facility] - [place], [place] - [address] [place] [address] Phone: [phone] Fax: [phone]     Social Determinants of Health (SDOH) Interventions    Readmission Risk Interventions Readmission Risk Prevention Plan 08/03/2020  Post Dischage Appt Complete  Medication Screening Complete  Some recent data might be hidden

## 2020-09-06 NOTE — Progress Notes (Signed)
Pharmacy Antibiotic Note  [name] is a 60 y.o. male admitted on 09/04/2020 with aspiration pneumonia; has been on cefepime/flagyl since 4/12.  Today, 4/12 -  Pharmacy has been consulted for Unasyn dosing.  Plan: Unasyn 3g IV q6h No dose adjustments needed, Pharmacy will sign off note writing but follow peripherally if renal function changes  Height: 6\' 2"  (188 cm) Weight: 98.4 kg (216 lb 14.9 oz) IBW/kg (Calculated) : 82.2  Temp (24hrs), Avg:97.9 F (36.6 C), Min:97.7 F (36.5 C), Max:98.3 F (36.8 C)  Recent Labs  Lab 09/04/20 2150 09/06/20 0526  WBC 11.0* 8.5  CREATININE 0.59* 0.47*    Estimated Creatinine Clearance: 114.2 mL/min (A) (by C-G formula based on SCr of 0.47 mg/dL (L)).    Allergies  Allergen Reactions  . Adhesive [Tape] Rash    Antimicrobials this admission: 4/12 Vanc x 1 4/12 Azithro x 1 4/12 Cefepime >> 4/13 4/12 Flagyl >> 4/13 4/13 Unasyn >>  Dose adjustments this admission:  Microbiology results: 4/12 Urine strep Ag: neg 412 BCx: ngtd 4/12 MRSA PCR: 4/13 Pleural fluid:  Thank you for allowing pharmacy to be a part of this patient's care.  5/13, PharmD, BCPS Pharmacy: [phone] 09/06/2020 1:47 PM

## 2020-09-06 NOTE — Progress Notes (Signed)
SLP Cancellation Note  Patient Details Name: [name] MRN: [id number] DOB: [birth year]   Cancelled treatment:       Reason Eval/Treat Not Completed: Other (comment) (pt npo at this time, will continue efforts) [name], MS [facility] SLP Acute Rehab Services Office [phone] Pager [phone]    Chales Abrahams 09/06/2020, 1:40 PM

## 2020-09-07 ENCOUNTER — Inpatient Hospital Stay (HOSPITAL_COMMUNITY): Payer: Medicare Other

## 2020-09-07 LAB — CBC WITH DIFFERENTIAL/PLATELET
Abs Immature Granulocytes: 0.12 10*3/uL — ABNORMAL HIGH (ref 0.00–0.07)
Basophils Absolute: 0.1 10*3/uL (ref 0.0–0.1)
Basophils Relative: 2 %
Eosinophils Absolute: 0.3 10*3/uL (ref 0.0–0.5)
Eosinophils Relative: 3 %
HCT: 40.2 % (ref 39.0–52.0)
Hemoglobin: 13.2 g/dL (ref 13.0–17.0)
Immature Granulocytes: 2 %
Lymphocytes Relative: 26 %
Lymphs Abs: 2.1 10*3/uL (ref 0.7–4.0)
MCH: 25.7 pg — ABNORMAL LOW (ref 26.0–34.0)
MCHC: 32.8 g/dL (ref 30.0–36.0)
MCV: 78.2 fL — ABNORMAL LOW (ref 80.0–100.0)
Monocytes Absolute: 1.3 10*3/uL — ABNORMAL HIGH (ref 0.1–1.0)
Monocytes Relative: 16 %
Neutro Abs: 4.2 10*3/uL (ref 1.7–7.7)
Neutrophils Relative %: 51 %
Platelets: 343 10*3/uL (ref 150–400)
RBC: 5.14 MIL/uL (ref 4.22–5.81)
RDW: 17.8 % — ABNORMAL HIGH (ref 11.5–15.5)
WBC: 8.2 10*3/uL (ref 4.0–10.5)
nRBC: 0 % (ref 0.0–0.2)

## 2020-09-07 LAB — LEGIONELLA PNEUMOPHILA SEROGP 1 UR AG: L. pneumophila Serogp 1 Ur Ag: NEGATIVE

## 2020-09-07 LAB — BASIC METABOLIC PANEL
Anion gap: 11 (ref 5–15)
BUN: 7 mg/dL (ref 6–20)
CO2: 24 mmol/L (ref 22–32)
Calcium: 9.2 mg/dL (ref 8.9–10.3)
Chloride: 104 mmol/L (ref 98–111)
Creatinine, Ser: 0.71 mg/dL (ref 0.61–1.24)
GFR, Estimated: >60 mL/min (ref >60)
Glucose, Bld: 99 mg/dL (ref 70–99)
Potassium: 4.1 mmol/L (ref 3.5–5.1)
Sodium: 139 mmol/L (ref 135–145)

## 2020-09-07 LAB — CYTOLOGY - NON PAP

## 2020-09-07 LAB — PROCALCITONIN: Procalcitonin: <0.1 ng/mL

## 2020-09-07 MED ORDER — AMOXICILLIN-POT CLAVULANATE 875-125 MG PO TABS: 1.0000 | ORAL_TABLET | Freq: Two times a day (BID) | ORAL | 0 refills | Status: AC

## 2020-09-07 MED ORDER — AZITHROMYCIN 250 MG PO TABS: 500.0000 mg | ORAL_TABLET | Freq: Every day | ORAL | Status: DC

## 2020-09-07 MED ORDER — SACCHAROMYCES BOULARDII 250 MG PO CAPS: 250.0000 mg | ORAL_CAPSULE | Freq: Two times a day (BID) | ORAL | Status: DC

## 2020-09-07 NOTE — Discharge Summary (Signed)
Physician Discharge Summary  [name] GUY:[id number] DOB: 1960-09-26 DOA: 09/04/2020  PCP: Harvest [place], MD  Admit date: 09/04/2020 Discharge date: 09/07/2020  Admitted From: Group home Discharge disposition: Back to group home   Code Status: DNR  Diet Recommendation: Soft diet with thin liquids  Discharge Diagnosis:   Principal Problem:   CAP (community acquired pneumonia) Active Problems:   Abdominal pain   Cerebral palsy, quadriplegic (HCC)   Pressure injury of skin   Microcytic anemia   Hx of right BKA (HCC)   Abnormal CT scan, colon   Chief Complaint  Patient presents with  . Cough   Brief narrative: [name] is a 61 y.o. 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 07/27/2020-patient had right BKA 3/7-3/10, patient was hospitalized for right sided PNA discharged with abx to complete course.  4/11, patient was brought to ED for worsening cough, coarse breath sounds, abdominal pain, nausea.  In the ED, patient was afebrile, hemodynamically stable, breathing room air. Labs with WBC count slightly elevated to 11 RVP negative CT angio of chest showed moderate to marked severity of right upper lobe, right middle lobe and right lower lobe airspace disease with large partially loculated right pleural effusion and a large hiatal hernia CT abdomen pelvis showed marked severity proctitis with possible underlying neoplastic process, mildly prominent small bowel loops, large gastric hernia.  Enlarged prostate. Patient was admitted to hospitalist service. See below for details  Subjective: Patient was seen and examined this morning. Lying down in bed.  Alert, awake, not in distress.  Sister not at bedside today.  Hospital course: Community-acquired/aspiration pneumonia Diffuse consolidation of right  upper, middle and lower lobes Large right pleural effusion -Despite significant findings in the CT chest, patient respiratory status has remained stable and he has not required supplemental oxygen.  -Currently on broad-spectrum IV antibiotics because of the concern of aspiration/community-acquired pneumonia.  -4/13, patient underwent thoracentesis with removal of 520 mL of slightly hazy yellow fluid.  Postthoracentesis, chest x-ray showed significant improvement. -Discharge on 5 more days of Augmentin with probiotics. Recent Labs  Lab 09/04/20 2150 09/05/20 0547 09/06/20 0526 09/07/20 0529  WBC 11.0*  --  8.5 8.2  PROCALCITON  --  <0.10 <0.10 <0.10   Chronic dysphagia/aspiration -Speech therapy evaluation obtained. -Patient has trouble clearing his secretions.  He is at risk of recurrent aspiration.   -Recommended dysphagia 3, mechanical soft diet with thin liquid.  Home health speech therapy follow-up ordered.  Marked proctitis Large hiatal hernia -Complains of abdominal pain.   -CT abdomen and pelvis showed marked proctitis, cannot rule out neoplastic process.   -GI consultation was obtained.  Proctitis likely secondary to constipation.   -Currently on Senokot scheduled, Dulcolax separatory as needed, MiraLAX as needed  -Proctosigmoidoscopy as an outpatient per GI.  History of Billroth II gastrojejunostomy History of Nissen fundoplication History of chronic iron deficiency anemia secondary to prior Billroth II -Hemoglobin at baseline currently  Hx of right BKA -07/27/2020, patient had right BKA -Stump remains intact. Continue ace wrap.   Pressure injury of skin -POA -attempt to turn/rotate in bed; known history of multiple decub ulcers  Cerebral palsy, quadriplegic -continue risperdal, ativan, depakote, baclofen -resides in a group home; wheelchair and bedbound  Okay to discharge back to group home today.   Wound care: Pressure Injury Arm  Left;Posterior;Proximal;Upper;Other (Comment)  Stage II -  Partial thickness loss of dermis presenting as a shallow open ulcer with a red, pink wound bed without slough. pink open wound on armpit (Active)  No Date First Assessed or Time First Assessed found.   Location: Arm  Location Orientation: Left;Posterior;Proximal;Upper;Other (Comment)  Staging: Stage II -  Partial thickness loss of dermis presenting as a shallow open ulcer with a red, pink wound ...    Assessments 04/08/2019  8:00 PM 04/12/2019 10:22 AM  Dressing Type Foam - Lift dressing to assess site every shift Foam - Lift dressing to assess site every shift  Dressing Changed Clean;Dry;Intact  Site / Wound Assessment -- Clean;Dry  % Wound base Red or Granulating 100% --  Wound Length (cm) 1 cm --  Wound Width (cm) 0.5 cm --  Wound Surface Area (cm^2) 0.5 cm^2 --  Drainage Amount None None     No Linked orders to display     Incision (Closed) 07/27/20 Leg Right (Active)  Date First Assessed/Time First Assessed: 07/27/20 1151   Location: Leg  Location Orientation: Right    Assessments 07/27/2020 12:05 PM 08/03/2020  7:04 PM  Dressing Type Compression wrap Compression wrap  Dressing Clean;Dry;Intact Clean;Dry;Intact  Site / Wound Assessment Clean;Dry;Dressing in place / Unable to assess Dressing in place / Unable to assess  Drainage Amount None None  Treatment -- Off loading     No Linked orders to display     Pressure Injury 07/31/20 Foot Left;Medial Stage 3 -  Full thickness tissue loss. Subcutaneous fat may be visible but bone, tendon or muscle are NOT exposed. (Active)  Date First Assessed/Time First Assessed: 07/31/20 2152   Location: Foot  Location Orientation: Left;Medial  Staging: Stage 3 -  Full thickness tissue loss. Subcutaneous fat may be visible but bone, tendon or muscle are NOT exposed.  Present on Admissi...    Assessments 07/31/2020  9:53 PM 09/07/2020  5:16 AM  Dressing Type Gauze (Comment);Other (Comment) Abdominal  pads  Dressing Clean;Dry;Intact Changed  Dressing Change Frequency -- Twice a day  State of Healing -- Epithelialized  Site / Wound Assessment -- Clean;Pink  % Wound base Red or Granulating 100% --  Wound Length (cm) 1 cm --  Wound Width (cm) 2 cm --  Wound Depth (cm) 0.2 cm --  Wound Surface Area (cm^2) 2 cm^2 --  Wound Volume (cm^3) 0.4 cm^3 --     No Linked orders to display     Pressure Injury 08/01/20 Knee Left;Medial Stage 1 -  Intact skin with non-blanchable redness of a localized area usually over a bony prominence. (Active)  Date First Assessed: 08/01/20   Location: Knee  Location Orientation: Left;Medial  Staging: Stage 1 -  Intact skin with non-blanchable redness of a localized area usually over a bony prominence.  Present on Admission: Yes    Assessments 08/01/2020 10:45 AM 08/03/2020  7:04 PM  Dressing Type Foam - Lift dressing to assess site every shift Foam - Lift dressing to assess site every shift  Dressing Clean;Dry;Intact Changed  Dressing Change Frequency Every 3 days Every 3 days  Site / Wound Assessment Clean;Dry Clean;Dry  % Wound base Red or Granulating -- 100%  Drainage Amount None None  Treatment -- Off loading     No Linked orders to display     Pressure Injury 08/01/20 Sacrum Right Stage 1 -  Intact skin with non-blanchable redness of a localized area usually over a bony prominence. (Active)  Date First Assessed: 08/01/20  Location: Sacrum  Location Orientation: Right  Staging: Stage 1 -  Intact skin with non-blanchable redness of a localized area usually over a bony prominence.  Present on Admission: Yes    Assessments 08/01/2020 11:00 AM 08/03/2020  7:04 PM  Dressing Type -- Foam - Lift dressing to assess site every shift  % Wound base Red or Granulating 100% 100%  Wound Length (cm) 1 cm --  Wound Width (cm) 1 cm --  Wound Surface Area (cm^2) 1 cm^2 --  Drainage Amount -- None  Treatment -- Off loading     No Linked orders to display     Incision  (Closed) 09/05/20 Pretibial Right;Proximal (Active)  Date First Assessed/Time First Assessed: 09/05/20 0439   Location: Pretibial  Location Orientation: Right;Proximal  Present on Admission: Yes    Assessments 09/05/2020  4:00 AM  Dressing Type Other (Comment)  Dressing Clean;Dry;Intact  Site / Wound Assessment Clean;Dry  Margins Attached edges (approximated)     No Linked orders to display    Discharge Exam:   Vitals:   09/06/20 1343 09/06/20 2043 09/07/20 0436 09/07/20 0929  BP: 134/74 140/87 (!) 142/77 125/80  Pulse: 84 73 77 68  Resp: 20 20 19 20   Temp: 98.3 F (36.8 C)  97.6 F (36.4 C) 98.3 F (36.8 C)  TempSrc: Oral  Oral Axillary  SpO2: 96% 99% 99% 99%  Weight:      Height:        Body mass index is 27.85 kg/m.  General exam: Middle-aged Caucasian male, bedbound with cerebral palsy, quadriplegia at baseline Skin: No rashes, lesions or ulcers. HEENT: Atraumatic, normocephalic, no obvious bleeding Lungs: Clear to auscultation bilaterally.  Not on supplemental oxygen CVS: Regular rate and rhythm, no murmur GI/Abd soft, nontender, nondistended, bowel sound present CNS: Alert, awake, baseline cognitive impairment, not restless or agitated Psychiatry: Mood appropriate Extremities: Recent right BKA status, left leg with compression bandage on.  Follow ups:   Discharge Instructions    Discharge wound care:   Complete by: As directed    Wound care to left foot, heel and LE:  Cleanse twice daily and pat dry. Moisturize with Eucerin cream.  Place ABD pad around heel, place silicone foam bordered dressing to knee full thickness wound and secure both with Kerlix roll gauze wrapped from just below toes to just below knee. Use paper tape to secure and place foot into patient's heel boot from home   Increase activity slowly   Complete by: As directed       Follow-up Information    Bakare, Mobolaji B, MD Follow up.   Specialty: Internal Medicine Contact information: [address] [place] [address] [phone]               Recommendations for Outpatient Follow-Up:   1. Follow-up with PCP as an outpatient  Discharge Instructions:  Follow with Primary MD [name] B, MD in 7 days   Get CBC/BMP checked in next visit within 1 week by PCP or SNF MD ( we routinely change or add medications that can affect your baseline labs and fluid status, therefore we recommend that you get the mentioned basic workup next visit with your PCP, your PCP may decide not to get them or add new tests based on their clinical decision)  On your next visit with your PCP, please Get Medicines reviewed and adjusted.  Please request your PCP  to go over all Hospital Tests and Procedure/Radiological results at  the follow up, please get all Hospital records sent to your Prim MD by signing hospital release before you go home.  Activity: As tolerated with Full fall precautions use walker/cane & assistance as needed  For Heart failure patients - Check your Weight same time everyday, if you gain over 2 pounds, or you develop in leg swelling, experience more shortness of breath or chest pain, call your Primary MD immediately. Follow Cardiac Low Salt Diet and 1.5 lit/day fluid restriction.  If you have smoked or chewed Tobacco in the last 2 yrs please stop smoking, stop any regular Alcohol  and or any Recreational drug use.  If you experience worsening of your admission symptoms, develop shortness of breath, life threatening emergency, suicidal or homicidal thoughts you must seek medical attention immediately by calling 911 or calling your MD immediately  if symptoms less severe.  You Must read complete instructions/literature along with all the possible adverse reactions/side effects for all the Medicines you take and that have been prescribed to you. Take any new Medicines after you have completely understood and accpet all the possible adverse reactions/side  effects.   Do not drive, operate heavy machinery, perform activities at heights, swimming or participation in water activities or provide baby sitting services if your were admitted for syncope or siezures until you have seen by Primary MD or a Neurologist and advised to do so again.  Do not drive when taking Pain medications.  Do not take more than prescribed Pain, Sleep and Anxiety Medications  Wear Seat belts while driving.   Please note You were cared for by a hospitalist during your hospital stay. If you have any questions about your discharge medications or the care you received while you were in the hospital after you are discharged, you can call the unit and asked to speak with the hospitalist on call if the hospitalist that took care of you is not available. Once you are discharged, your primary care physician will handle any further medical issues. Please note that NO REFILLS for any discharge medications will be authorized once you are discharged, as it is imperative that you return to your primary care physician (or establish a relationship with a primary care physician if you do not have one) for your aftercare needs so that they can reassess your need for medications and monitor your lab values.    Allergies as of 09/07/2020      Reactions   Adhesive [tape] Rash      Medication List    TAKE these medications   acetaminophen 500 MG tablet Commonly known as: TYLENOL Take 500 mg by mouth every 6 (six) hours as needed for moderate pain.   albuterol 108 (90 Base) MCG/ACT inhaler Commonly known as: VENTOLIN HFA Inhale 2 puffs into the lungs every 4 (four) hours as needed for shortness of breath or wheezing.   alum & mag hydroxide-simeth 200-200-20 MG/5ML suspension Commonly known as: MAALOX/MYLANTA Take 30 mLs by mouth every 6 (six) hours as needed for indigestion or heartburn. Use as directed as needed for nausea/indigestion per standing order   amoxicillin-clavulanate  875-125 MG tablet Commonly known as: Augmentin Take 1 tablet by mouth every 12 (twelve) hours for 5 days.   ascorbic acid 250 MG tablet Commonly known as: VITAMIN C Take 1 tablet (250 mg total) by mouth daily.   baclofen 20 MG tablet Commonly known as: LIORESAL Take 1 tablet (20 mg total) by mouth 3 (three) times daily.   bisacodyl 10 MG  suppository Commonly known as: DULCOLAX Place 1 suppository (10 mg total) rectally daily as needed for moderate constipation.   brompheniramine-pseudoephedrine 1-15 MG/5ML Elix Commonly known as: DIMETAPP Take 10 mLs by mouth daily as needed (Cold sympton).   cetirizine 10 MG tablet Commonly known as: ZYRTEC Take 1 tablet (10 mg total) by mouth daily. What changed: when to take this   clotrimazole 1 % cream Commonly known as: LOTRIMIN Apply 1 application topically 2 (two) times daily.   divalproex 125 MG capsule Commonly known as: DEPAKOTE SPRINKLE Take 500 mg by mouth 2 (two) times daily.   fluticasone 50 MCG/ACT nasal spray Commonly known as: FLONASE Place 1 spray into both nostrils daily.   guaiFENesin-dextromethorphan 100-10 MG/5ML syrup Commonly known as: ROBITUSSIN DM Take 10 mLs by mouth every 4 (four) hours as needed for cough.   hydrocortisone cream 1 % APPLY TOPICALLY AT BEDTIME AS NEEDED FOR ITCHING What changed: See the new instructions.   iron polysaccharides 150 MG capsule Commonly known as: NIFEREX Take 150 mg by mouth daily at 12 noon.   ketoconazole 2 % shampoo Commonly known as: NIZORAL APPLY TOPICALLY TO AFFECTED AREA(S) TWICE WEEKLY AS DIRECTED What changed: See the new instructions.   LORazepam 2 MG tablet Commonly known as: ATIVAN Take 1 mg by mouth every 4 (four) hours as needed for anxiety.   mupirocin ointment 2 % Commonly known as: BACTROBAN Apply 1 application topically daily as needed (wound care).   omeprazole 40 MG capsule Commonly known as: PRILOSEC Take 1 capsule (40 mg total) by mouth 2  (two) times daily.   ondansetron 4 MG tablet Commonly known as: ZOFRAN Take 1 tablet (4 mg total) by mouth every 6 (six) hours as needed for nausea.   oyster calcium 500 MG Tabs tablet Take 1,500 mg of elemental calcium by mouth daily.   polyethylene glycol 17 g packet Commonly known as: MiraLax Take 17 g by mouth daily as needed for moderate constipation. What changed: additional instructions   Prevalon Heel Protector Misc 1 Device by Does not apply route daily.   risperiDONE 2 MG tablet Commonly known as: RISPERDAL Take 2 mg by mouth at bedtime.   saccharomyces boulardii 250 MG capsule Commonly known as: FLORASTOR Take 1 capsule (250 mg total) by mouth 2 (two) times daily.   senna-docusate 8.6-50 MG tablet Commonly known as: Senokot-S Take 1 tablet by mouth at bedtime.   tamsulosin 0.4 MG Caps capsule Commonly known as: FLOMAX TAKE 1 CAPSULE BY MOUTH EVERY DAY *DO NOT CRUSH* What changed: See the new instructions.   tolterodine 2 MG 24 hr capsule Commonly known as: Detrol LA Take 1 capsule (2 mg total) by mouth daily.   triamcinolone cream 0.1 % Commonly known as: KENALOG Apply 1 application topically 3 (three) times daily. Apply cream to left ankle three time daily   TRIPLE ANTIBIOTIC EX Apply 1 application topically daily as needed (cuts/scrapes/scratches).   Vitamin D3 125 MCG (5000 UT) Caps Take 5,000 Units by mouth daily.   Voltaren 1 % Gel Generic drug: diclofenac Sodium APPLY 4 GRAMS TOPICALLY TO AFFECTED AREA(S) FOUR TIMES DAILY What changed: See the new instructions.   Zinc Oxide 40 % Pste Apply 1 application topically 2 (two) times daily as needed (unspecified). Desitin Max strength paste apply thin layer topically to buttocks and scrotum            Discharge Care Instructions  (From admission, onward)         Start  Ordered   09/07/20 0000  Discharge wound care:       Comments: Wound care to left foot, heel and LE:  Cleanse twice  daily and pat dry. Moisturize with Eucerin cream.  Place ABD pad around heel, place silicone foam bordered dressing to knee full thickness wound and secure both with Kerlix roll gauze wrapped from just below toes to just below knee. Use paper tape to secure and place foot into patient's heel boot from home   09/07/20 1126          Time coordinating discharge: 35 minutes  The results of significant diagnostics from this hospitalization (including imaging, microbiology, ancillary and laboratory) are listed below for reference.    Procedures and Diagnostic Studies:   CT Angio Chest PE W/Cm &/Or Wo Cm  Result Date: 09/05/2020 CLINICAL DATA:  Cough and chest discomfort. EXAM: CT ANGIOGRAPHY CHEST WITH CONTRAST TECHNIQUE: Multidetector CT imaging of the chest was performed using the standard protocol during bolus administration of intravenous contrast. Multiplanar CT image reconstructions and MIPs were obtained to evaluate the vascular anatomy. CONTRAST:  OMNIPAQUE IOHEXOL 350 MG/ML SOLN COMPARISON:  July 31, 2020 FINDINGS: Cardiovascular: Satisfactory opacification of the pulmonary arteries to the segmental level. No evidence of pulmonary embolism. Normal heart size. No pericardial effusion. Mediastinum/Nodes: No enlarged mediastinal, hilar, or axillary lymph nodes. Debris is seen within the lumen of the right mainstem bronchus. The thyroid gland demonstrates no significant findings. Lungs/Pleura: Moderate to marked severity areas of airspace disease are seen within the right upper lobe, right middle lobe and right lower lobe. A large, partially loculated right pleural effusion is present. A small left pleural effusion is also seen. No pneumothorax is identified. Upper Abdomen: There is a large, stable hiatal hernia. Multiple adjacent, posterior surgical clips are noted. Musculoskeletal: Degenerative changes are seen throughout the thoracic spine. Review of the MIP images confirms the above findings.  IMPRESSION: 1. Moderate to marked severity right upper lobe, right middle lobe and right lower lobe airspace disease. 2. Large, partially loculated right pleural effusion. 3. Small left pleural effusion. 4. Large hiatal hernia. Electronically Signed   By: Aram Candela M.D.   On: 09/05/2020 00:43   CT Abdomen Pelvis W Contrast  Result Date: 09/05/2020 CLINICAL DATA:  Abdominal pain. EXAM: CT ABDOMEN AND PELVIS WITH CONTRAST TECHNIQUE: Multidetector CT imaging of the abdomen and pelvis was performed using the standard protocol following bolus administration of intravenous contrast. CONTRAST:  OMNIPAQUE IOHEXOL 350 MG/ML SOLN COMPARISON:  April 06, 2019 FINDINGS: Lower chest: Mild to moderate severity areas of atelectasis and/or infiltrate are seen within the bilateral lung bases. There is a large right pleural effusion. A small left pleural effusion is also seen. Hepatobiliary: No focal liver abnormality is seen. Mild central intrahepatic biliary dilatation is noted. A subcentimeter gallstone is seen within the lumen of an otherwise normal-appearing gallbladder. Pancreas: Unremarkable. No pancreatic ductal dilatation or surrounding inflammatory changes. Spleen: Normal in size without focal abnormality. Adrenals/Urinary Tract: Adrenal glands are unremarkable. Kidneys are normal, without renal calculi, focal lesion, or hydronephrosis. Bladder is unremarkable. Stomach/Bowel: There is a large gastric hernia. Multiple adjacent surgical clips are seen. The appendix is not clearly identified. A large amount of stool is seen throughout the large bowel. Two mildly prominent fluid-filled small bowel loops are seen within the mid right abdomen (axial CT images 41 through 46, CT series number 2). Marked severity circumferential thickening of the rectum is noted. Persistent twisting of the mesentery is seen  within the mid to lower abdomen, along the midline (axial CT images 42 through 54, CT series number 2).  Vascular/Lymphatic: Mild aortic atherosclerosis. No enlarged abdominal or pelvic lymph nodes. Reproductive: Moderate to marked severity prostate gland enlargement is seen. Other: No abdominal wall hernia or abnormality. No abdominopelvic ascites. Musculoskeletal: Degenerative changes are seen throughout the lumbar spine. IMPRESSION: 1. Rectal wall thickening which may represent sequelae associated with marked severity proctitis. An underlying neoplastic process cannot be excluded. 2. Bibasilar atelectasis and/or infiltrate with bilateral pleural effusions. 3. Mildly prominent small bowel loops within the mid right abdomen. While this may be transient nature, early partial small bowel obstruction cannot be excluded. 4. Large gastric hernia. 5. Cholelithiasis. 6. Enlarged prostate gland. 7. Aortic atherosclerosis. Aortic Atherosclerosis (ICD10-I70.0). Electronically Signed   By: Aram Candela M.D.   On: 09/05/2020 00:37   DG Chest Port 1 View  Result Date: 09/04/2020 CLINICAL DATA:  Cough and chest pressure.  Chest pain. EXAM: PORTABLE CHEST 1 VIEW COMPARISON:  Radiograph 08/03/2020.  CT 07/31/2020 FINDINGS: Patient is rotated due to contracture. Volume loss in the right hemithorax with progressive opacification primarily involving the lower thorax. This likely represents combination of pleural fluid and airspace disease/atelectasis. No focal left lung abnormalities. Retrocardiac hiatal hernia is only faintly visualized. Heart is grossly normal in size. No pneumothorax. IMPRESSION: Volume loss in the right hemithorax with progressive opacification primarily involving the lower thorax. This likely represents combination of pleural fluid and airspace disease/atelectasis. Electronically Signed   By: Narda Rutherford M.D.   On: 09/04/2020 21:34     Labs:   Basic Metabolic Panel: Recent Labs  Lab 09/04/20 2150 09/06/20 0526 09/07/20 0529  NA 141 136 139  K 4.1 3.9 4.1  CL 108 105 104  CO2 25 22 24    GLUCOSE 81 94 99  BUN 16 13 7   CREATININE 0.59* 0.47* 0.71  CALCIUM 9.1 8.6* 9.2  MG  --  1.9  --   PHOS  --  4.2  --    GFR Estimated Creatinine Clearance: 114.2 mL/min (by C-G formula based on SCr of 0.71 mg/dL). Liver Function Tests: Recent Labs  Lab 09/04/20 2150  AST 22  ALT 26  ALKPHOS 153*  BILITOT <0.1*  PROT 6.6  ALBUMIN 2.7*   Recent Labs  Lab 09/04/20 2150  LIPASE 21   No results for input(s): AMMONIA in the last 168 hours. Coagulation profile No results for input(s): INR, PROTIME in the last 168 hours.  CBC: Recent Labs  Lab 09/04/20 2150 09/06/20 0526 09/07/20 0529  WBC 11.0* 8.5 8.2  NEUTROABS 6.0 5.2 4.2  HGB 11.6* 11.4* 13.2  HCT 37.3* 35.8* 40.2  MCV 79.7* 81.5 78.2*  PLT 328 322 343   Cardiac Enzymes: No results for input(s): CKTOTAL, CKMB, CKMBINDEX, TROPONINI in the last 168 hours. BNP: Invalid input(s): POCBNP CBG: No results for input(s): GLUCAP in the last 168 hours. D-Dimer No results for input(s): DDIMER in the last 72 hours. Hgb A1c No results for input(s): HGBA1C in the last 72 hours. Lipid Profile No results for input(s): CHOL, HDL, LDLCALC, TRIG, CHOLHDL, LDLDIRECT in the last 72 hours. Thyroid function studies No results for input(s): TSH, T4TOTAL, T3FREE, THYROIDAB in the last 72 hours.  Invalid input(s): FREET3 Anemia work up No results for input(s): VITAMINB12, FOLATE, FERRITIN, TIBC, IRON, RETICCTPCT in the last 72 hours. Microbiology Recent Results (from the past 240 hour(s))  Resp Panel by RT-PCR (Flu A&B, Covid) Nasopharyngeal Swab  Status: None   Collection Time: 09/05/20  2:02 AM   Specimen: Nasopharyngeal Swab; Nasopharyngeal(NP) swabs in vial transport medium  Result Value Ref Range Status   SARS Coronavirus 2 by RT PCR NEGATIVE NEGATIVE Final    Comment: (NOTE) SARS-CoV-2 target nucleic acids are NOT DETECTED.  The SARS-CoV-2 RNA is generally detectable in upper respiratory specimens during the acute  phase of infection. The lowest concentration of SARS-CoV-2 viral copies this assay can detect is 138 copies/mL. A negative result does not preclude SARS-Cov-2 infection and should not be used as the sole basis for treatment or other patient management decisions. A negative result may occur with  improper specimen collection/handling, submission of specimen other than nasopharyngeal swab, presence of viral mutation(s) within the areas targeted by this assay, and inadequate number of viral copies(<138 copies/mL). A negative result must be combined with clinical observations, patient history, and epidemiological information. The expected result is Negative.  Fact Sheet for Patients:  BloggerCourse.com  Fact Sheet for Healthcare Providers:  SeriousBroker.it  This test is no t yet approved or cleared by the [place] FDA and  has been authorized for detection and/or diagnosis of SARS-CoV-2 by FDA under an Emergency Use Authorization (EUA). This EUA will remain  in effect (meaning this test can be used) for the duration of the COVID-19 declaration under Section 564(b)(1) of the Act, 21 U.S.C.section 360bbb-3(b)(1), unless the authorization is terminated  or revoked sooner.       Influenza A by PCR NEGATIVE NEGATIVE Final   Influenza B by PCR NEGATIVE NEGATIVE Final    Comment: (NOTE) The Xpert Xpress SARS-CoV-2/FLU/RSV plus assay is intended as an aid in the diagnosis of influenza from Nasopharyngeal swab specimens and should not be used as a sole basis for treatment. Nasal washings and aspirates are unacceptable for Xpert Xpress SARS-CoV-2/FLU/RSV testing.  Fact Sheet for Patients: BloggerCourse.com  Fact Sheet for Healthcare Providers: SeriousBroker.it  This test is not yet approved or cleared by the [place] FDA and has been authorized for detection and/or diagnosis of  SARS-CoV-2 by FDA under an Emergency Use Authorization (EUA). This EUA will remain in effect (meaning this test can be used) for the duration of the COVID-19 declaration under Section 564(b)(1) of the Act, 21 U.S.C. section 360bbb-3(b)(1), unless the authorization is terminated or revoked.  Performed at [facility], [address], [place], [place] [address]   Culture, blood (Routine X 2) w Reflex to ID Panel     Status: None (Preliminary result)   Collection Time: 09/05/20  5:47 AM   Specimen: BLOOD LEFT HAND  Result Value Ref Range Status   Specimen Description   Final    BLOOD LEFT HAND Performed at [facility], [address] [place], [place] [address]    Special Requests   Final    BOTTLES DRAWN AEROBIC ONLY Blood Culture adequate volume Performed at [facility], [address], [place], [place] [address]    Culture   Final    NO GROWTH 2 DAYS Performed at [facility], [address], [place], [place] [address]    Report Status PENDING  Incomplete  Culture, blood (Routine X 2) w Reflex to ID Panel     Status: None (Preliminary result)   Collection Time: 09/05/20  5:47 AM   Specimen: BLOOD RIGHT HAND  Result Value Ref Range Status   Specimen Description   Final    BLOOD RIGHT HAND Performed at [facility], [address], [place], [place] [address]    Special Requests   Final    BOTTLES DRAWN AEROBIC ONLY Blood Culture adequate volume Performed at [facility], [address], [place], [place] [address]    Culture   Final    NO GROWTH 2 DAYS Performed at [facility], [address], [place], [place] [address]    Report Status PENDING  Incomplete  Body fluid culture w Gram Stain     Status: None (Preliminary result)   Collection Time: 09/06/20 11:36 AM   Specimen: PATH Cytology Pleural fluid  Result Value Ref Range Status   Specimen Description   Final     PLEURAL Performed at [facility], [address] [place], [place] [address]    Special Requests   Final    NONE Performed at [facility], [address], [place], [place] [address]    Gram Stain   Final    MODERATE WBC PRESENT, PREDOMINANTLY MONONUCLEAR NO ORGANISMS SEEN    Culture   Final    NO GROWTH < 24 HOURS Performed at [facility], [address] [place], [place] [address]    Report Status PENDING  Incomplete     Signed: Melina Schools Jaidynn Balster  [facility] 09/07/2020, 11:27 AM

## 2020-09-07 NOTE — Progress Notes (Signed)
  Speech Language Pathology Treatment: Dysphagia  Patient Details Name: [name] MRN: [id number] DOB: Jun 09, 1960 Today's Date: 09/07/2020 Time: 2841-3244 SLP Time Calculation (min) (ACUTE ONLY): 9 min  Assessment / Plan / Recommendation Clinical Impression  Skilled SLP follow for education re: MBS findings, importance of oral clearance of secretions, improving oral manipulation---  Masticate-organize and swallow!  Reviewed suspect aspiration due to delayed swallow allowing boluses to spill into airway before or during swallow.  Importance clearing secretions reviewed as well as incr asp risk due this CP.  Pt making great progress in comprehending results of MBS and requirement for precautions requiring mod cues to re-state. Provided pt with written precautions for assistance in generalization with teach back. Follow up with HH advised due to this new oropharyngeal dysphagia.  Advise consider RMST to strengthen pt's cough response to improved airway clearance.      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 Patient resides in a group home.  He was recently hospitalized 3/7 - 3/10 for right sided PNA discharged with abx to complete course. 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   He presents back to the hospital from his group home due to worsening complaints of cough, coarse breath sounds, abdominal pain, nausea.  Pt is quadriplegic continue risperdal, ativan, depakote  - resume baclofen when able  - resides in a group home; wheelchair and bedbound.  CT angio of chest showed moderate to marked severity of right upper lobe, right middle lobe and right lower lobe airspace disease with large partially loculated right pleural effusion and a large hiatal hernia  CT abdomen pelvis showed marked severity proctitis with possible underlying neoplastic process, mildly prominent small bowel  loops, large gastric hernia.  Enlarged prostate.  Pt reports he coughs on occasion with food more than drink and his sister states he is on a soft/thin diet at his group home.      SLP Plan  Continue with current plan of care       Recommendations  Liquids provided via: Straw Medication Administration: Other (Comment) Supervision: Full supervision/cueing for compensatory strategies Compensations: Slow rate;Small sips/bites (ORAL SUCTION BEFORE AND AFTER PO) Postural Changes and/or Swallow Maneuvers: Upright 30-60 min after meal (REVERSE TRENELENBERG)                Oral Care Recommendations: Oral care before and after PO Follow up Recommendations: Home health SLP (2's week for 4 weeks recommended) SLP Visit Diagnosis: Dysphagia, oropharyngeal phase (R13.12) Plan: Continue with current plan of care       GO                [name] 09/07/2020, 10:09 AM   [name] Infante, MS [facility] SLP Acute Rehab Services Office [phone] Pager [phone]

## 2020-09-07 NOTE — [place] FL2 (Signed)
[place] MEDICAID FL2 LEVEL OF CARE SCREENING TOOL     IDENTIFICATION  Patient Name: [name] Birthdate: [birth year] Sex: male Admission Date (Current Location): 09/04/2020  [facility] and [place] Number:  Producer, television/film/video and Address:         Provider Number: [id number]  Attending Physician Name and Address:  Lorin Glass, MD  Relative Name and Phone Number:  Monna Fam (Sister)   [phone]    Current Level of Care: Hospital Recommended Level of Care: Family Care Home Prior Approval Number:    Date Approved/Denied:   PASRR Number:    Discharge Plan: Domiciliary (Rest home) [facility])    Current Diagnoses: Patient Active Problem List   Diagnosis Date Noted  . Abnormal CT scan, colon   . 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 Nausea and vomiting 07/31/2020  . 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 Osteomyelitis (HCC) 10/08/2019  . Acute osteomyelitis of right ankle or foot (HCC)   . Ulcerated, foot, right, with necrosis of bone (HCC)   . Pseudomonas aeruginosa infection 09/06/2019  . MRSA infection 09/06/2019  . Lactic acidosis   . Pressure injury of skin 08/10/2018  . Gastric wall thickening 08/10/2018  . Alkaline phosphatase elevation 08/10/2018  . Cholelithiasis 08/10/2018  . Microcytic anemia   . Hiatal hernia 04/07/2018  . 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 Quadriplegia (HCC) 12/15/2015  . Cerebral palsy, quadriplegic (HCC)   . Pre-diabetes 08/31/2015  . Foot ulcer, limited to breakdown of skin (HCC) 06/08/2015  . Seasonal allergies 11/01/2014  . Abdominal pain   . Pressure ulcer of foot 03/14/2014  . CAP (community acquired pneumonia) 03/13/2014  . Pressure ulcer 01/08/2013  . URINARY INCONTINENCE 02/09/2010  . Mental retardation 07/24/2006  . Infantile cerebral palsy (HCC) 07/24/2006    Orientation RESPIRATION BLADDER Height & Weight     Self,Time,Situation,Place  Normal  Continent Weight: 98.4 kg Height:  6\' 2"  (188 cm)  BEHAVIORAL SYMPTOMS/MOOD NEUROLOGICAL BOWEL NUTRITION STATUS   (none)  (none) Incontinent Diet (Soft diet with thin liquids)  AMBULATORY STATUS COMMUNICATION OF NEEDS Skin   Total Care Verbally Other (Comment) (Pressure Injury: L foot, medial; L knee, medial.  Incision: R Pretibial)                       Personal Care Assistance Level of Assistance  Total care           Functional Limitations Info  Sight,Hearing,Speech Sight Info: Adequate Hearing Info: Adequate Speech Info: Adequate    SPECIAL CARE FACTORS FREQUENCY  PT (By licensed PT),Speech therapy     PT Frequency: 2X/W       Speech Therapy Frequency: 2X/W      Contractures Contractures Info: Present    Additional Factors Info  Code Status,Allergies Code Status Info: DNR Allergies Info: Adhesive tape           Current Medications (09/07/2020):  This is the current hospital active medication list    Discharge Medications:  acetaminophen 500 MG tablet Commonly known as: TYLENOL Take 500 mg by mouth every 6 (six) hours as needed for moderate pain.           albuterol 108 (90 Base) MCG/ACT inhaler Commonly known as: VENTOLIN HFA Inhale 2 puffs into the lungs every 4 (four) hours as needed for shortness of breath or wheezing.          alum &  mag hydroxide-simeth 200-200-20 MG/5ML suspension Commonly known as: MAALOX/MYLANTA Take 30 mLs by mouth every 6 (six) hours as needed for indigestion or heartburn. Use as directed as needed for nausea/indigestion per standing order         Icon medications to start taking   amoxicillin-clavulanate 875-125 MG tablet Commonly known as: Augmentin Take 1 tablet by mouth every 12 (twelve) hours for 5 days.          ascorbic acid 250 MG tablet Commonly known as: VITAMIN C Take 1 tablet (250 mg total) by mouth daily.          baclofen 20 MG tablet Commonly known as: LIORESAL Take 1 tablet (20 mg total) by mouth 3  (three) times daily. For: Muscle Spasticity          bisacodyl 10 MG suppository Commonly known as: DULCOLAX Place 1 suppository (10 mg total) rectally daily as needed for moderate constipation.          brompheniramine-pseudoephedrine 1-15 MG/5ML Elix Commonly known as: DIMETAPP Take 10 mLs by mouth daily as needed (Cold sympton).          cetirizine 10 MG tablet Commonly known as: ZYRTEC Take 1 tablet (10 mg total) by mouth daily.          clotrimazole 1 % cream Commonly known as: LOTRIMIN Apply 1 application topically 2 (two) times daily.          divalproex 125 MG capsule Commonly known as: DEPAKOTE SPRINKLE Take 500 mg by mouth 2 (two) times daily.          fluticasone 50 MCG/ACT nasal spray Commonly known as: FLONASE Place 1 spray into both nostrils daily.          guaiFENesin-dextromethorphan 100-10 MG/5ML syrup Commonly known as: ROBITUSSIN DM Take 10 mLs by mouth every 4 (four) hours as needed for cough.          hydrocortisone cream 1 % APPLY TOPICALLY AT BEDTIME AS NEEDED FOR ITCHING          iron polysaccharides 150 MG capsule Commonly known as: NIFEREX Take 150 mg by mouth daily at 12 noon.          ketoconazole 2 % shampoo Commonly known as: NIZORAL APPLY TOPICALLY TO AFFECTED AREA(S) TWICE WEEKLY AS DIRECTED          LORazepam 2 MG tablet Commonly known as: ATIVAN Take 1 mg by mouth every 4 (four) hours as needed for anxiety.          mupirocin ointment 2 % Commonly known as: BACTROBAN Apply 1 application topically daily as needed (wound care).          omeprazole 40 MG capsule Commonly known as: PRILOSEC Take 1 capsule (40 mg total) by mouth 2 (two) times daily.          ondansetron 4 MG tablet Commonly known as: ZOFRAN Take 1 tablet (4 mg total) by mouth every 6 (six) hours as needed for nausea.          oyster calcium 500 MG Tabs tablet Take 1,500 mg of elemental calcium by mouth daily.          polyethylene glycol 17 g packet Commonly known as:  MiraLax Take 17 g by mouth daily as needed for moderate constipation.          Prevalon Heel Protector Misc 1 Device by Does not apply route daily.          risperiDONE 2 MG  tablet Commonly known as: RISPERDAL Take 2 mg by mouth at bedtime.         Icon medications to start taking   saccharomyces boulardii 250 MG capsule Commonly known as: FLORASTOR Take 1 capsule (250 mg total) by mouth 2 (two) times daily.          senna-docusate 8.6-50 MG tablet Commonly known as: Senokot-S Take 1 tablet by mouth at bedtime.          tamsulosin 0.4 MG Caps capsule Commonly known as: FLOMAX TAKE 1 CAPSULE BY MOUTH EVERY DAY *DO NOT CRUSH*          tolterodine 2 MG 24 hr capsule Commonly known as: Detrol LA Take 1 capsule (2 mg total) by mouth daily.          triamcinolone cream 0.1 % Commonly known as: KENALOG Apply 1 application topically 3 (three) times daily. Apply cream to left ankle three time daily          TRIPLE ANTIBIOTIC EX Apply 1 application topically daily as needed (cuts/scrapes/scratches).          Vitamin D3 125 MCG (5000 UT) Caps Take 5,000 Units by mouth daily.          Voltaren 1 % Gel APPLY 4 GRAMS TOPICALLY TO AFFECTED AREA(S) FOUR TIMES DAILY Generic drug: diclofenac Sodium          Zinc Oxide 40 % Pste Apply 1 application topically 2 (two) times daily as needed (unspecified). Desitin Max strength paste apply thin layer topically to buttocks and scrotum             Relevant Imaging Results:  Relevant Lab Results:   Additional Information [address]  [place], [place]

## 2020-09-07 NOTE — Progress Notes (Signed)
Called and spoke with [name] at [facility]. Call report given on patient and new medications reviewed with [name]. [name] is aware that discharge instructions will be sent home with patient.

## 2020-09-07 NOTE — TOC Transition Note (Signed)
Transition of Care [facility]) - CM/SW Discharge Note   Patient Details  Name: [name] MRN: [id number] Date of Birth: August 17, 1960  [facility] CM/SW Contact:  [place], LCSW Phone Number: 09/07/2020, 1:04 PM   Clinical Narrative:  Patient who is stable for discharge will be returning to [facility].  Sister [guardian] alerted.  PTAR arranged.  Orders for [facility] services and DME seen and appreciated.  HH orders sent with patient packet per [facility] manager [name]'s instruction, along with FL2 and D/C summary.  Ordered oral suctioning device from [facility] to be sent home with patient.  Nursing, please call report to [name] at [phone].  TOC sign off.     Final next level of care: Home w Home Health Services Barriers to Discharge: No Barriers Identified   Patient Goals and CMS Choice        Discharge Placement                       Discharge Plan and Services                                     Social Determinants of Health (SDOH) Interventions     Readmission Risk Interventions Readmission Risk Prevention Plan 08/03/2020  Post Dischage Appt Complete  Medication Screening Complete  Some recent data might be hidden

## 2020-09-07 NOTE — Progress Notes (Addendum)
Modified Barium Swallow Progress Note  Patient Details  Name: [name] MRN: [id number] Date of Birth: 06-17-1960  Today's Date: 09/07/2020  Modified Barium Swallow completed.  Full report located under Chart Review in the Imaging Section.  Brief recommendations include the following:  Clinical Impression  Patient presents with mild oropharyngeal dysphagia mostly characterized by decreased oral coordination/transiitng and delayed pharyngeal swallow with all barium boluses accumulating at pyriform sinus.   No aspiration and flash penetration cleared with further swallows.  Pharyngeal swallow is overall strong without retention.  Pt's largest aspiration risk is due to his decreased management of secretions and delay in swallow.  Agamjot must be fully upright for po intake due to delay in swallowing and SLP used reverse trendelenburg for optimal positionining.  No cough produced during testing however suspect when pt is coughing- it is likely due to airway inflitration.  Tablet taken with pudding easily transited into esophagus - and SLP advises pt to continue medication administration in this manner.  Recommend brief follow up at SNF for SLP to address improving pt's timing of swallow - initiating more efficiently for improved airway protection.  In addition, recommend pt have oral care BEFORE and AFTER po to clear copious secretions retained in oral cavity.  Aspiration pna and aspiration risk is chronic due to pt's dysphagia from his CP and his quadriparesis increases his pna risk when he aspirates.     Home health SLP x2's week x 4 weeks advised to improve pt's swallow efficiency and management of secretions.    Swallow Evaluation Recommendations       SLP Diet Recommendations: Dysphagia 3 (Mech soft) solids;Thin liquid   Liquid Administration via: Straw   Medication Administration: Whole meds with puree   Supervision: Full supervision/cueing for compensatory strategies    Compensations: Slow rate;Small sips/bites   Postural Changes: Remain semi-upright after after feeds/meals (Comment);Seated upright at 90 degrees   Oral Care Recommendations: Oral care before and after PO      [name], MS [facility] SLP Acute Rehab Services Office [phone] Pager [phone]   Chales Abrahams 09/07/2020,10:00 AM

## 2020-09-07 NOTE — Progress Notes (Signed)
PHARMACIST - PHYSICIAN COMMUNICATION DR:   Pola Corn CONCERNING: Antibiotic IV to Oral Route Change Policy  RECOMMENDATION: This patient is receiving azithromycin by the intravenous route.  Based on criteria approved by the Pharmacy and Therapeutics Committee, the antibiotic(s) is/are being converted to the equivalent oral dose form(s).   DESCRIPTION: These criteria include:  Patient being treated for a respiratory tract infection, urinary tract infection, cellulitis or clostridium difficile associated diarrhea if on metronidazole  The patient is not neutropenic and does not exhibit a GI malabsorption state  The patient is eating (either orally or via tube) and/or has been taking other orally administered medications for a least 24 hours  The patient is improving clinically and has a Tmax < 100.5  If you have questions about this conversion, please contact the Pharmacy Department , [phone] )  [facility]   Cardarius Senat P. Casimiro Needle, PharmD, BCPS Clinical Pharmacist [place] Please utilize Amion for appropriate phone number to reach the unit pharmacist [facility]) 09/07/2020 11:17 AM

## 2020-09-09 LAB — BODY FLUID CULTURE W GRAM STAIN: Culture: NO GROWTH

## 2020-09-10 LAB — CULTURE, BLOOD (ROUTINE X 2)
Culture: NO GROWTH
Culture: NO GROWTH
Special Requests: ADEQUATE
Special Requests: ADEQUATE

## 2020-09-14 ENCOUNTER — Encounter (HOSPITAL_BASED_OUTPATIENT_CLINIC_OR_DEPARTMENT_OTHER): Payer: Medicare Other | Admitting: Internal Medicine

## 2020-09-14 ENCOUNTER — Other Ambulatory Visit: Payer: Self-pay

## 2020-09-14 DIAGNOSIS — Z86718 Personal history of other venous thrombosis and embolism: Secondary | ICD-10-CM | POA: Diagnosis not present

## 2020-09-14 DIAGNOSIS — I87302 Chronic venous hypertension (idiopathic) without complications of left lower extremity: Secondary | ICD-10-CM

## 2020-09-14 DIAGNOSIS — G809 Cerebral palsy, unspecified: Secondary | ICD-10-CM | POA: Diagnosis not present

## 2020-09-14 DIAGNOSIS — Z7901 Long term (current) use of anticoagulants: Secondary | ICD-10-CM | POA: Diagnosis not present

## 2020-09-14 DIAGNOSIS — Z8616 Personal history of COVID-19: Secondary | ICD-10-CM | POA: Diagnosis not present

## 2020-09-14 DIAGNOSIS — L97322 Non-pressure chronic ulcer of left ankle with fat layer exposed: Secondary | ICD-10-CM | POA: Diagnosis not present

## 2020-09-14 DIAGNOSIS — L97522 Non-pressure chronic ulcer of other part of left foot with fat layer exposed: Secondary | ICD-10-CM | POA: Diagnosis not present

## 2020-09-14 DIAGNOSIS — I872 Venous insufficiency (chronic) (peripheral): Secondary | ICD-10-CM | POA: Diagnosis not present

## 2020-09-14 DIAGNOSIS — R532 Functional quadriplegia: Secondary | ICD-10-CM | POA: Diagnosis not present

## 2020-09-14 DIAGNOSIS — I890 Lymphedema, not elsewhere classified: Secondary | ICD-10-CM | POA: Diagnosis not present

## 2020-09-14 NOTE — Progress Notes (Signed)
[name], [name] ([id number]) Visit Report for 09/14/2020 Chief Complaint Document Details Patient Name: Date of Service: [name], [name] 09/14/2020 10:45 A M Medical Record Number: [id number] Patient Account Number: [id number] Date of Birth/Sex: Treating RN: 05-Oct-1960 (60 y.o. [name] Primary Care Provider: [name] Other Clinician: Referring Provider: Treating Provider/Extender: Estill Bakes, [name] in Treatment: 25 Information Obtained from: Patient Chief Complaint Patient is here for multiple wounds on his bilateral feet and left ankle. 03/23/2020; patient is here for review of multiple wounds on his bilateral feet and ankles Electronic Signature(s) Signed: 09/14/2020 12:56:44 PM By: Geralyn Corwin DO Entered By: Geralyn Corwin on 09/14/2020 12:52:13 -------------------------------------------------------------------------------- HPI Details Patient Name: Date of Service: [name]. 09/14/2020 10:45 A M Medical Record Number: [id number] Patient Account Number: [id number] Date of Birth/Sex: Treating RN: 14-Sep-1960 (60 y.o. [name] Primary Care Provider: [name] Other Clinician: Referring Provider: Treating Provider/Extender: Estill Bakes, [name] in Treatment: 25 History of Present Illness HPI Description: 11/23/15; this is a patient I remember from a previous stay in this clinic in 2012. The patient says this was for a wound in this same area as currently although I have no information to verify that. Most of the information is provided by his caregiver from the group home where he resides. Apparently he has had a wound in this area for 8 months. He also has erythema around this area and has had multiple rounds of antibiotics apparently with some improvement but the erythema is persists. He apparently had a fracture in the ankle 2 months ago and was seen by Dr. Lajoyce Corners . He apparently had a  splint applied and more recently a heel offloading boot. He had home health coming out to a week ago not clear what they are dressing this with but they apparently discharged him perhaps because of one of Dr. 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 He has a Nurse, adult for transferring at home he is nonambulatory and does not wear footwear. He is not a diabetic. Looking through [facility] he appears to have fairly active primary care. The area on the ankle is variably labeled as a pressure area and/or chronic venous insufficiency. He had a recent venous ultrasound on 5/25 that did not show a DVT in the left leg however this was not a reflux study. There was no superficial DVT either. Arterial studies on 10/15/14 showed a left ABI of 1.16 Center right of 1.12 waveforms were triphasic he does not have significant arterial disease. He has had recent x-rays of the left foot and ankle. The left foot did not show any abnormality. Left ankle x-ray showed a spiral fracture of the left tibial diaphysis without evidence of osteomyelitis. 11/30/15 culture I did last week showed pseudomonas I changed him to oral ciprofloxacin. Erythema is a lot better. 12/07/15 area around the wound shows considerable improvement of the periwound erythema. 12/14/15; the patient has a improvement of the periwound erythema which in retrospect I think with cellulitis successfully treated. We have been using Iodoflex started last week 12/25/15 wound on the left medial malleolus and dorsal left foot. Theraskin #1 applied READMISSION 02/27/16; the patient was admitted to hospital from 8/14 through 01/18/16 initially felt to have cellulitis of his left lower leg however he was noted to have significant persistent pain and swelling. A duplex ultrasound was ordered that was negative. Obie Dredge an x-ray was done that showed a spiral fracture of the left  leg. With posterior displacement and angulation at  the mid left tibia. Orthopedics saw the patient and splinted the leg. He was then sent to [facility] skilled facility and only return to his group home last week. He has wounds on the left medial malleolus, left dorsal foot and a worrisome area on the plantar left heel which is not really open but may represent a hematoma or a DTI 03/06/16; areas of left anterior and medial ankle both look improved. There are 3 small wounds here. He has a area over the dorsal left first metatarsal head which is not open. In meantime his left heel has opened 03/14/16 the medial left ankle wounds looks stable to improved. The area on the posterior left heel has opened up and had a very adherent eschar and slept over this 03/21/16 patient has 3 wounds on his left medial ankle is stable area on his left dorsal first metatarsal head appears to be improved. Continued problems with adherent eschar over the left heel 03/28/16; patient has 3 wounds on his left medial ankle and lower leg all of these appear to be stable. The problem continues to be the left heel and the adherent eschar. This may have been a pressure ulcer from her brace at one point. He has a English as a second language teacher we have been using [employer] and all wound areas. Dressings are being changed by home health 04/04/16; the patient's areas on the medial aspect of the left ankle looks superficial and appear to be gradually closing. The area on the plantar aspects/Achilles aspect of his left heel as surface slough and nonviable subcutaneous tissue requiring debridement. 04/11/16; the areas on the patient's medial left ankle appear to be superficial and appear to be closing. The area on the plantar heel on the left has surface slough and nonviable subcutaneous tissue requiring debridement. I don't think this is too much different from last week there is also undermining 04/25/16; patient is followed in his group home by [facility] home [facility]. The area on the plantar heel is  smaller but with still with some depth. We're using Iodoflex to this. The areas on the left ankle looks healthy but dry and somewhat larger. Cause of this is not really clear using Prisma 05/02/16; areas x3 all look improved expecially the areas on the medial ankle. Heel wound is smaller but still deep 05/09/16; all areas 3 look improved on the medial ankle. Heel wound is also contracting. 12/0.9/17 the areas on his left medial ankle are all closed. I think these are largely chronic venous insufficiency however there is no edema. The area on his left heel was a pressure area. READMISSION 08/02/16; this man is a patient that we have had in this clinic on several times before. I believe he has cerebral palsy is nonambulatory. He has chronic venous insufficiency with venous inflammation. He was last year in the fall of 2017 with areas on his left medial ankle left heel and left dorsal foot. He arrives today with once again a vague history. As mentioned he is nonambulatory he spends most of his day in a pressure offloading boot nevertheless they noted wounds in this area on the medial left ankle 2 weeks ago. There is any other history here I am uncertain 08/15/16; the patient's wounds on his medial ankle actually look better however the area on the left lateral/dorsal foot is covered in black necrotic eschar. Previous formal arterial studies did not show significant arterial disease with normal ABIs and triphasic waveforms.  He is immobilized and at risk for pressure although he seems aware protective boots at all times as far as I am aware. 08/22/16; in general his wounds look better. All the wounds on his medial left ankle look improved the area on his lateral foot however continues to be problematic. Patient comes in the clinic today in a new wheelchair he is quite delighted 09/05/16; a wound on the left medial Calf and left dorsal foot are healed. The area on the left lateral foot is improved however the  area on the left medial malleolus is inflamed with a considerable degree of surrounding erythema. This looks like cellulitis. ABIs and clinical exam are within normal limits 09/12/16; the patient continues to have a wound on the left medial calf. I was concerned about cellulitis in this area last week and gave him a course of doxycycline. The wound looks better here. The area on the left lateral foot however is unchanged 09/20/16; the patient came in this week early after traumatizing his toe on the left foot on a table while moving around in his motorized wheelchair. 09/26/16- he is here for follow-up evaluation of his left foot and malleolus ulcers. Since his appointment he abraded his left medial hallux against a table while in his motorized wheelchair, apparently trying to pull up to the table for a meal. He continues to complain of tenderness to the left lateral foot, surrounding the current ulcer 10/07/16; left foot and malleolus ulcers. 10/14/16; the patient is doing well. He has a wound on the left lateral, left medial malleolus and atraumatic wound on the left great toe. Although these appear to be superficial and closing 10/31/16; the patient is doing well. The area on the left lateral foot, left medial malleolus and the left great toe or all epithelialized. He has home health 11/14/16; patient arrives today with the area on the left great toe healed. The left lateral foot however in the left medial malleolus and the ankle superficial wounds that are slightly larger. He has erythema and tenderness around the area on the left lateral foot. As well he is complaining of dysuria. His caregiver notes that when he gets like this he is more lethargic which indeed seems to be the case today 11/21/16; left great toe is still healed. His wound on the left lateral foot appears healthy and slightly smaller also the area on the left medial ankle. This had black eschar on it which I removed. The remaining wound  looks satisfactory 12/12/16; patient hasn't been here in a number of weeks. The area on the left lateral foot is healed however on his medial ankle has 2 open areas one of them covered with a necrotic eschar. He also has a new wound on the right second toe which was apparently trauma induced while he was at a movie 12/27/16; the patient has wounds on his left medial mallelus and left lateral foot that are just about closed. The area on the right second toe which was trauma from last time is still open but again everything looks a lot better here. 01/30/17; patient arrives in clinic today and is really been a long time since I've seen him. The wounds are all epithelialized on the left foot this includes his left lateral foot, left medial malleolus. Apparently was in the ER for what of I think was a fungal infection on his buttock's he was prescribed Diflucan and doxycycline I think this is already getting better. READMISSION T is a now 60 year old  man who has advanced cerebral palsy and functional quadriplegia. We have had him in this clinic multiple times before for wounds erry on his right ankle, left ankle, left dorsal foot and most recently from 08/02/16 through 01/30/17 with his left medial ankle and left foot and finally right second toe. His attendant from the group home where he lives is very knowledgeable about him. She states that they noticed breakdown in his skin in his feet sometime in January although they could not get an appointment until today in this clinic. The previous wounds have always been felt to be secondary to incidental trauma/pressure areas or possibly some degree of chronic venous insufficiency. He had normal arterial studies in may of 2016. He has definite open wounds on the right lateral foot over the base of the fifth metatarsal, the right second toe DIP, the left medial malleolus. He has concerning areas over the left lateral fifth metatarsal base, retaform purpura over the  medial left ankle and perhaps the medial left first toe. 07/21/17; culture we did last week from the base of the right fifth metatarsal grew group G strep. This should've been sensitive to the doxycycline I gave him. The other major wounds are on the right second toe DIP, left medial malleolus, base of the left lateral fifth metatarsal and the medial left ankle and medial left toe. 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 We have macrovascular arterial studies scheduled for Thursday although I don't think this is what the problem is. 07/28/17; patient arrives with really no improvement. He has a wound on the right second toe, lateral right foot, right medial malleolus which was new this week, left medial malleolus, but looked like a DTI on the left great toe. His macrovascular studies are on 07/30/17 although I would be surprised if this provides all the explanation. He has small areas even on his right second and third toe which look like splinter-type hemorrhages. The question I have is where is this coming from. I'd like to see the vascular studies however before we pursue this. He has had a DVT in the past but is no longer on anticoagulants. I'll need to review his history 08/04/17; the patient had his ARTERIAL STUDIES this showed a right ABI of 1.16 and the left ABI of 1.20. TBI on the left at 0.84. He is at a right first toe amputation. The studies were unchanged from studies in May 2016. It was not felt that he had significant lower extremity arterial disease In the meantime we are using silver alginate to the wounds on the right second toe right lateral foot left medial malleolus. I'm going to change to Silver collagen today. 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 Working him up for this would include an echocardiogram probably a CT scan of the chest and  abdomen to look at the major thoracic and abdominal aortic vessels. I am not going to involve myself in this unless there is more symptoms/signs 08/18/17; the patient arrives today with wounds looking somewhat better. The remaining areas are the right second toe dorsal, right lateral foot and left medial malleolus which only has a small open area. We've been using silver collagen 09/01/17;right second toe dorsally probes to bone today. This is a deterioration. We've been using silver collagen. Only a small open area remains on the left medial malleolus. 09/09/17; right second toe appears better. There  is no probing bone. He has a small area remaining on the left medial malleolus, the right lateral foot. We have been using silver collagen 10/07/17; the patient returns to clinic today after a month hiatus. He has wounds over the right second toe, right lateral foot and the left medial malleolus. We have been using silver collagen. He has home health. 10/24/17; patient has open wounds over the right second toe, now the left fourth toe and a superficial area over his left buttock. Some of the other areas have healed including the left medial malleolus and right lateral foot. Using silver collagen on these wounds 11/07/17; the patient has closed his wounds on the right second toe and left fourth toe although they look all normal. Last time he was here he had a superficial area over the left buttock this is also closed. The right lateral foot wound I had closed out last time although it is seems to have reopened today. 628/19 the areas over the right second and left fourth toe remains closed. The right lateral foot has tightly adherent necrotic surface over. We have been using silver collagen he offloads this and a bunny boot 12/12/17; the patient has reopened the recurrent area over the right second PIP joint. 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 He  does not have obvious macrovascular disease. The area over the right lateral foot looks about the same as last time tightly adherent necrotic debris. We've been using Hydrofera Blue 12/25/17; 2 week follow-up. The area over the right second PIP is closed however the area on the right lateral foot is worse with surrounding erythema and pain compatible with cellulitis is going to need an antibiotic. AS WELL..... We have looked at his buttocks and lower back. He has 3 stage II ulcers. The exact history here is unclear however he is very disabled man with cerebral palsy. He goes to a day program from the group home in which she lives 5 days a week and I think he is on his buttock all day on these visits. Surrounding the wounds see has extensive stage I injury as well. FINALLY it looks as though he has lost weight. I'm not the only staff member that is recognized this 01/08/18 on evaluation today patient appears to be doing better in regard to his gluteal wounds. In fact everything appears to be healed back here although very dry. He has a couple open sores on the scrotal region other than this it is really his foot that is still giving him the most trouble. He did complete the Keflex unfortunately it seems like there's still a lot of erythema in this in fact looks worse than it did previous. I'm gonna culture this today and likely start with a different antibiotic. 01/16/18; I was reassured that the wounds on his buttocks and sacrum are healed. I did not look at these the day. The culture that was done last week on 01/11/18 of the right lateral foot wounds showed Pseudomonas. Bactrim is not a good choice for this. Ciprofloxacin interacts with his muscle relaxants therefore I prescribed cefdinir 300 twice a day for 7 days. Bactrim coverage of pseudomonas is not reliable although the degree of erythema looks a lot better 01/30/18; the patient has completed his antibiotics. The area does not have infection on the  right lateral foot. The wounds look a lot better. We've been using silver alginate 02/12/18; there is on the  right lateral foot. Copious amounts of surface eschar. Also similarly over the PIP of the right second toe. We've been using silver alginate to date 03/02/2018; the area that remains is on the right lateral foot. The right second toe is healed. 03/16/2018; the area there is original wound on the right lateral foot. Still requiring debridement. We are using Hydrofera Blue. The right second toe is still healed/closed 03/30/2018; right lateral foot there is 2 small open areas remaining. They actually look quite healthy we have been using Hydrofera Blue the right second toe remains closed 04/13/2018; right lateral foot. 1 of the 2 small open areas from last time has closed over. The other had surface slough that I removed with a #3 curette. This looks quite healthy. The right second toe remains closed. We are using Hydrofera Blue 05/01/2018; right lateral foot very superficial areas that look like they are trying to close over. He had a new threatened area on the left medial ankle just below the medial malleolus. I think these represent chronic venous insufficiency areas 05/14/2018; the right right lateral foot looks like it closed over as was predicted last time however he has some dark areas subcutaneously erythema superiorly and this is really very tender. This is either coexistent cellulitis or perhaps a deep tissue injury. The area on the left medial ankle is superficial but has some size. We have been using Hydrofera Blue to both areas. The patient complains of pain in his right foot 06/09/2018; right lateral foot was closed over last time but there was coexistent cellulitis around this. I gave him Levaquin. He also has an area on the left medial malleolus. He has not been back to clinic in over 3-1/2 weeks. We are using Hydrofera Blue I think to the left medial malleolus and silver alginate to  the right lateral foot 1/21; right lateral foot still non-viable debris over this area and the left medial malleolus. Both of these vigorously washed off with antiseptic gauze. We have been using silver alginate. 2/4; right lateral foot this looks somewhat better. Unfortunately the area over the left medial malleolus has deteriorated. Using silver alginate in both area 2/18; both wounds arrived today covered in a lot of necrotic debris. We have been using silver alginate. Clearly not making a lot of progress. 3/3; the patient's right second toe is reopened over the PIP. I am not sure why this is happening. He may have microvascular disease. We have checked his macrovascular flow in the past and found it to be normal. He may need an x-ray. The area on the right lateral foot requires debridement. The area on the left lateral ankle is worse. We have been using PolyMem Ag 08/26/18 on evaluation today patient appears to be doing a little worse in regard to his lower extremities based on what I'm seeing what the nursing staff is telling me. His left medial ankle appears potential to be infected there is your thing on want surrounding the wound. His right second toe is also of concern at this point in my pinion. We have not done x-rays of these areas that may be a good idea to go ahead and proceed with at this point to ensure will not deal with anything such as osteomyelitis. 4/16; it is been a while since I saw this patient. He now has wounds on the right lateral foot, dorsal right second toe, substantially on the left medial malleolus and a small area on the left lateral foot. We have been  using PolyMem Silver. Curlex and Coban. He has home health changing the dressing. Since last time he was here he had a culture of the left medial ankle that showed MRSA. He is completed a course/7 days of doxycycline. XRAYS of the left ankle and right foot that were ordered were negative for osteomyelitis 4/30; he  arrives in clinic today with some maceration around the wound and a lot of tenderness and erythema in a circumferential area around the right lateral foot. He has wound areas on the right lateral foot dorsal right second toe, largely over the left medial malleolus and a small area over the left lateral foot 5/7; completed his antibiotics from last time right lateral foot cellulitis is better and the area on his left knee which we discovered after he had been seen by the provider also is improved. He still has extensive wound areas over the right lateral foot, left medial malleolus, right second toe and left lateral foot although all of these look somewhat better with PolyMem 5/15. Wounds on the right lateral foot, right second toe left medial malleolus and the left lateral foot. All of these look somewhat better. We have been using PolyMem Ag 5/26; the wound on the right lateral foot is almost closed over but he still complains of a lot of pain in this area. He has a eschared area on the right second toe but no open wound.. Also concerning today his original wound on the left medial malleolus looks healthy however just inferior to this almost areas of deep tissue injury and there is an area on the left lateral foot also looks like a deep tissue injury. In the past I have wondered whether he might have cholesterol emboli or some other form of a vasculopathy. I wonder whether this could have something to do with this. 6/2; right lateral foot wound is still open I gave him empiric antibiotics last week without really a lot of change she is still complaining of pain in this area. We x- rayed his feet at some point in April these were negative. It would be difficult to do an MRI. The area on his left medial malleolus looks better. Right second toe is healed. He still has what looks to be a small pressure related area on the left lateral foot but it is not open. We are using PolyMem to all wound which  seems to be doing nicely 6/16; right lateral foot wound is still open. The wound looks better although there is still a lot of tenderness around it. Rather than more empiric antibiotics I have cultured this today. We are using PolyMem Ag. On the left lateral malleolus the wound appears better. He has a new area on the left lower anterior pretibial area 6/29; 2-week follow-up. Culture I did of the right lateral foot 2 weeks ago showed MRSA. I gave him 2 weeks of Septra which he should be just about finishing now and this seems better. We got a call from home health last week to report swelling in the left leg because we were listed as his primary. We referred him back to primary care. He arrives today with intense erythema and tenderness around the 2 wounds on the left medial ankle on the left lower calf. The area on the right lateral foot looks better 7/7; I brought him back this week to follow-up on his left foot which had cellulitis last week. I gave him clindamycin which as far as I know he tolerated. The  erythema is better. He has a 3 current wounds one on the right lateral foot a small area on the right lateral malleolus and the more recent area on the left anterior pretibial area. 7/21; bilateral distal lower leg and foot wounds. The area on his right lateral foot looks a lot better. He has an area on his left pretibial area distally. The area on the medial malleolus on the left which is 1 of his worst wounds has closed over. He does not appear to have any open areas in his remaining toes 8/4-Patient returns at 2 weeks, he has the remaining wound on the left lower leg the other 2 wounds have healed. Patient is continued to be treated with silver alginate with a 2 layer wrap on the left 8/18; I been following this patient for a long period for bilateral lower extremity wounds. 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 His wounds are probably  pressure although he religiously wears thick bunny boots. He does not have macrovascular disease but I did formal testing on him perhaps 2 or 3 years ago. I have often wondered about either cholesterol emboli or a vasculopathy of some sort given the distal nature of these variable locations etc. He has 2 areas that we have been following one on the left medial lower leg and one on the right lateral foot. Both of these are mostly healed today there is some eschar over sections of both areas although I did not disturb this. 03/31/2019 on evaluation today patient appears to be doing a little bit more poorly in regard to the sacral region. He has 2 small wounds noted at this point. Fortunately there is no signs of active infection at this time. No fevers, chills, nausea, vomiting, or diarrhea. He is having some pain when he sits on his gluteal region a big part of the reason he has these wounds is likely due to the fact that his wheelchair has not been functioning properly. He does still have a couple areas of deep tissue injury on his lower extremities he does need new Prevalon offloading boots as well. 04/28/2019 on evaluation today patient appears to be doing well with regard to his wounds in particular. His ankle area has fairly small based on what I am seeing currently and his sacral region actually is still open but does not appear to be doing too poorly in my opinion. Fortunately there is no signs of active infection at this time. He was in the hospital unfortunately and this was from 04/08/2019 through 04/12/2019 secondary to persistent diarrhea, dehydration and acute kidney injury. With that being said fortunately the patient does not seem to be doing too poorly at this time which is good news. He is having pain mainly in the sacral region. 06/23/2019 on evaluation today patient appears to be doing well with regard to his sacral region which in fact is completely healed. Unfortunately he has an  area of rubbing/pressure on his right lateral chest/back region. This is where it is rubbing up on his chair. Subsequently he also has an open wound on the left medial malleolus which is not very open but starting to crack and then on the right lateral foot this is much more open at this point. Unfortunately there is no signs of significant improvement in regard to these areas and in fact there does appear to be evidence of active infection quite significantly. No fevers, chills, nausea, vomiting, or diarrhea. The  patient did test positive for COVID-19 that has been greater than 14 days ago although they did not know the exact timing. That is why he was not here for his last appointment. 07/07/2019 upon evaluation today patient actually appears to be doing quite well in regard to his left ankle as well as the chest/back region which is doing great in fact appears to be healed in my opinion. He still has an issue on his lateral right foot that is open and appears to be infected but does have me more concerned. In fact I do believe the doxycycline helped in general for the cellulitis but I believe that may have been more MRSA related based on the culture that we did. What I am seeing right now has more of the blue-green drainage and may be more consistent with a Pseudomonas that I was hoping was not really causing much of an issue but I think it may be at this point on the right lateral foot. For that reason I do think treatment would be a good idea of the reason I avoided the Cipro before was the potential for QT prolongation which could obviously cause problems with the patient with results of interaction with respiratory wound. The tizanidine also had some interactions as well. Nonetheless I think at this point that it would be a possibility for [place] to use topical gentamicin to see if this could be of benefit for the patient underneath the silver alginate dressing. 07/21/2019 upon evaluation today  patient appears to be doing fairly well at this point in regard to his lower extremity on the right. He does have a wound on his right lateral foot as well as the right fifth toe and the right fourth toe. Unfortunately he continues to experience significant issues with pressure and trauma to some degree and I think this is just due to the fact that again he is not really not able to control his legs and what is happening here. Fortunately there is no signs of active infection at this time. 08/04/2019 upon evaluation today patient actually appears to be doing quite well with regard to his toe ulcers I am very pleased in that regard. Unfortunately the foot ulcer is showing some signs of erythema his primary care provider has placed him on clindamycin at this point. Again I do believe this can be beneficial for him. With that being said I do think the patient may benefit from a light Kerlix and Coban wrap to help with some of his edema at this point. 08/18/2019 upon evaluation today patient appears to be doing still poorly in regard to his lateral foot ulcer. This is not very deep but there is some concern about the possibility of osteomyelitis. I think it would be appropriate for [place] to go ahead and have an x-ray at this time. The patient is currently on clindamycin which from his culture which was performed on 06/23/2019 it does appear that he would benefit from that in regard to the MRSA. However Pseudomonas was also identified then and the patient actually has some blue-green drainage at this point as well. I am much more concerned about the possibility of the Pseudomonas being an infection is causative agent at this point that I am the MRSA. Subsequently Cipro the patient was sensitive to but we never use that is stable use a topical gent due to the fact that unfortunately there was some interaction between the Cipro and the patient's risk for down potentially causing QT  prolongation. 08/31/2019; have not  seen this patient in quite a period of time. He has a substantial wound on his right lateral foot we have been using gentamicin ointment with Hydrofera Blue over the top. From what I am able to determine he is also on antibiotics perhaps clindamycin although I have not had a chance to look over this. He also has an eschared area on the right fifth toe as well as the right second toe. 4/20; right lateral foot wound. He comes in today with intense erythema and tenderness around this wound. The wound itself has a surface on it but it doesn't look particularly healthy a lot of gelatinous debris. There is no palpable bone. We've been using gentamicin ointment and Hydrofera Blue. Since the patient was last here he was seen by Dr. Algis Liming of infectious disease. He ordered an MRI that I think is due later this month on 4/30. He apparently also checked a sedimentation rate and C-reactive protein metabolic panel CBC with differential. At the time he was seen he wanted to withhold any microbials pending identification of the depth of the infection although looking at this I don't know that I'm going to be able to hold off for another 10 days. 4/29; he went on to have the MRI that was ordered by Dr. Algis Liming of infectious disease. 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 I had given him cefdinir last week for surrounding cellulitis he is finishing this today. I have communicated with Dr. Algis Liming who wants bone for pathology and culture before proceeding with consideration of IV antibiotics. Fortunately he is due to see Dr. Samuella Cota of podiatry today I am hopeful that the bone biopsy and culture will be considered there 5/4 as I understand things Dr. Samuella Cota actually wants to remove the diseased proximal fifth metatarsal. Looking back at this decision is probably not a bad one. He has good blood flow he is nonambulatory they are apparently going to do this next week. Paradoxically  the wound on the lateral part of his foot is actually a lot better. The cellulitis that was so problematic 2 weeks ago also has resolved. READMISSION 03/23/2020 T is now a 60 year old man he has cerebral palsy and is functionally paraplegic. The last time I saw him he had a diseased proximal fifth metatarsal. 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 Samuella Cota. Culture of this showed Pseudomonas and he was followed by infectious disease Dr. Algis Liming treated with ciprofloxacin. 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 He had Integra placed. A CandS showed Pseudomonas again. Since then he has had Silvadene as a primary dressing, then Santyl and more recently Xeroform and then back to Santyl 3 times a week per his attendant who comes with him. Reviewing his records I was really not prepared for the number of wounds that he currently has. This includes substantial areas on the right lateral foot, right lateral ankle, right fourth toe dorsally, right ankle anteriorly as well as the left lateral ankle and foot. His medical history is unchanged ABIs 1.2 on the right and 1.21 on the left he has not been known to have arterial insufficiency and is not a diabetic 11/5; patient I readmitted to the clinic last week. He has multiple wounds on the right lateral foot, right fourth toe and a new  area on the right second toe. He has areas on the right dorsal ankle and a substantive wound over the left lateral ankle and foot. We are using silver collagen. We put him under compression last week and this seems to have helped. He has home health coming out one other time per week to change the dressing 11/12; no real improvement. Multiple bilateral foot and ankle wounds. On the left he has a substantial wound on the left lateral ankle On the right he has wounds on the dorsal second and  fourth toes. Right lateral foot we have merged to wounds these are necrotic She has an area on the right dorsal ankle and a area on the right medial malleolus with marked surrounding erythema likely cellulitis. The patient complains about pain that kept him awake all last night from wraps that were too tight. He is not known to have arterial insufficiency. 11/18; no real change. I gave him empiric Levaquin last week directed at previously cultured Pseudomonas this is not really helped. On the right he has wounds on the right lateral foot right lateral ankle right medial ankle and the dorsal second and fourth toes. Most of these do not have a nice have a healthy surface. There is erythema in his foot which may all be venous stasis. The area on the left lateral malleolus/ankle area looks better 12/3; really disappointing if anything deterioration especially on the right lateral foot. These are necrotic. He also has areas on the right dorsal ankle right medial malleolus and the right second and fourth toes. On the left he has a large area over the left medial ankle. There is some erythema around the wound. Things are very tender I have reviewed his most recent arterial Dopplers which are actually in 2019 but he has never been felt to have arterial issues. At that time his ABI on the right was 1.16 [no TBI due to amputation] on the left his ABI was 1.20 with a great toe pressure of 0.24. Waveforms on the right and left toes were all normal he was not felt to have significant evidence of lower extremity arterial disease. ABIs in our clinic have always been normal his pulses have been palpable. He had an MRI of his right foot in April 2021 this showed a soft tissue ulceration along the lateral midfoot with underlying osteomyelitis no abscess. 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 He returned to our clinic on 03/23/2020. The same pattern of wounds that I am describing currently this was a lot  worse than earlier in the year. I gave him empiric antibiotics because he had had Pseudomonas in the past this does not seem to have helped. We have not made any progress on any of these wound site. The patient is in a lot of pain 12/10; the patient comes in with a new deep tissue injury on the right heel large necrotic wound on the right lateral foot, smaller necrotic wound on the dorsal ankle and medial ankle. Necrotic wounds on the second and fourth toes. We have been using Hydrofera Blue On the left he has the area on the left little medial ankle which actually looks some better healthier looking tissue. We are using Hydrofera Blue here as well X-ray ordered last week showed no osseous erosion no plain film indication of osteomyelitis. 12/17; PCR culture I did of the right lateral foot wound last week showed a large titer of Enterococcus faecalis and low titers of of coag  negative staph and Peptostreptococcus. I have started him on Augmentin for 2 weeks but I may continue beyond this. He has continuous complaints of pain now apparently making it difficult for him to rest at night. Substantial wound area in the right foot and ankle. Unfortunately the left medial foot seems to be more substantial this week. 12/27; in general the wounds on the right foot and ankle and the left medial ankle look better. There is certainly less erythema in the right foot. I have him on Augmentin and I am going to extend this for another week. MRI is booked for 06/01/2020 06/02/2020; patient's extensive skin breakdown on the right foot continues it is stable to perhaps slightly improved however there is a considerable amount of tissue breakdown. Unfortunately his MRI shows findings consistent with osteomyelitis in the lateral aspect of the cuboid. The overlying wound in this area is deep but less necrotic than I am used to seeing. He has area on the right posterior heel is now necrotic.. The area on the left lateral  ankle/foot looks somewhat better. The patient states he continues to be in a lot of pain from the right foot and he is really tired of hurting from these wounds. I have discussed things with T before. He is anxious to be out of pain. He is nonambulatory. Furthermore the osteomyelitis in the right foot and the erry extensive wounds in this area are only a source of potential systemic infection. He seems in favor of proceeding with a below-knee amputation. I cannot argue with this we have not made a lot of progress in several months working on these wounds ever since his fifth ray amputation by Dr. Samuella Cota 1/28; in general the patient's right foot looks better although there is several wounds still present there were certainly look less angry. The area on the right lateral foot looks better he still has a necrotic area on the right Achilles heel but this is separating the right fourth toe and the right medial malleolus. We have been using silver alginate with Bactroban on all of these wounds. On the left medial ankle the wound looks healthy we have been using Hydrofera Blue. He has an appointment I think on Monday with Dr. Victorino Dike with regards to a BKA. Although the wounds on the right foot look better than they did heat the patient still relates that he has occasional pain and he suffered with this through the years he would rather have a below-knee amputation. Provided Dr. Victorino Dike agrees with this he is anxious to proceed 2/11; the patient continues to make decent progress with the area on the left medial ankle. We are using Hydrofera Blue in this area. Although retreating the right foot wounds palliatively he continues to make improvement in the right lateral dorsal foot, right heel and the wounds on his toes. He saw Dr. Victorino Dike to also concurred with the amputation directed has underlying osteomyelitis. The patient has suffered a great deal with the pain in this foot from recurrent wounds although the  wounds currently are making progress I do not think this decision is the wrong one. He is nonambulatory and has underlying osteomyelitis 2/25; the left medial ankle wound is much better. We have been using Hydrofera Blue on this home health changing the dressing Amputation with Dr. Victorino Dike on 07/27/2020. The wounds on his lateral foot are necrotic. The rest of the multiple wounds look the same. 3/18; the patient had his amputation on 07/27/2020. He was sent home the  same day however he required readmission from 07/31/2020 2 through 08/03/2020 caused by community-acquired pneumonia, nausea and vomiting. He comes back in today for follow-up. The story we are getting from his caregiver is that he saw his primary doctor earlier this week and was put on Augmentin for cellulitis of the lower leg on the left. Indeed the last time we saw this patient this was just about healed it is much larger today. He also was not put in compression which could have resulted in breakdown as well as infection 3/25; again a very different looking wound on the left medial ankle than what we've been used to seeing. We used Hydrofera Blue here under compression however the wound is larger albeit superficial. No obvious infection 4/7; left medial ankle area still somewhat inflamed but a lot better than last time. There is perhaps 2 tiny open areas remaining in this area this is just about closed we have been using TCA silver alginate Kerlix Coban. We were not made aware of any problems with the right stump we did not look either 4/21; patient presents for evaluation of his left medial ankle. He states that the wounds are closed. He has had no issues since last time he was in our clinic. He hoping for discharge today. Electronic Signature(s) Signed: 09/14/2020 12:56:44 PM By: Geralyn Corwin DO Entered By: Geralyn Corwin on 09/14/2020 12:53:19 -------------------------------------------------------------------------------- Physical  Exam Details Patient Name: Date of Service: [name], [name] 09/14/2020 10:45 A M Medical Record Number: [id number] Patient Account Number: [id number] Date of Birth/Sex: Treating RN: June 15, 1960 (60 y.o. [name] Primary Care Provider: [name] Other Clinician: Referring Provider: Treating Provider/Extender: Estill Bakes, [name] in Treatment: 25 Constitutional respirations regular, non-labored and within target range for patient.Marland Kitchen Psychiatric pleasant and cooperative. Notes Left medial foot and ankle: Wound site is epithelialized. He has good edema control. Electronic Signature(s) Signed: 09/14/2020 12:56:44 PM By: Geralyn Corwin DO Entered By: Geralyn Corwin on 09/14/2020 12:54:02 -------------------------------------------------------------------------------- Physician Orders Details Patient Name: Date of Service: [name], [name] 09/14/2020 10:45 A M Medical Record Number: [id number] Patient Account Number: [id number] Date of Birth/Sex: Treating RN: 1960/12/12 (60 y.o. [name] Primary Care Provider: [name] Other Clinician: Referring Provider: Treating Provider/Extender: Estill Bakes, [name] in Treatment: 29 Verbal / Phone Orders: No Diagnosis Coding ICD-10 Coding Code Description [phone] Non-pressure chronic ulcer of left ankle with fat layer exposed I87.302 Chronic venous hypertension (idiopathic) without complications of left lower extremity Discharge From [facility] Discharge from Wound Care Center - Call if any future wound care needs. Discontinue all previous wound care orders. Edema Control - Lymphedema / SCD / Other Patient to wear own compression stockings every day. - Apply stocking to left leg every morning and remove at night. Moisturize legs daily. - left leg every night before bed. Home Health Discontinue home health for wound care. - [facility]. Electronic  Signature(s) Signed: 09/14/2020 12:56:44 PM By: Geralyn Corwin DO Entered By: Geralyn Corwin on 09/14/2020 12:54:14 -------------------------------------------------------------------------------- Problem List Details Patient Name: Date of Service: [name], [name] 09/14/2020 10:45 A M Medical Record Number: [id number] Patient Account Number: [id number] Date of Birth/Sex: Treating RN: 1961/04/30 (60 y.o. [name] Primary Care Provider: [name] Other Clinician: Referring Provider: Treating Provider/Extender: Estill Bakes, [name] in Treatment: 25 Active Problems ICD-10 Encounter Code Description Active Date MDM Diagnosis L97.322 Non-pressure chronic ulcer of left ankle with fat layer exposed 03/23/2020 No Yes I87.302 Chronic venous hypertension (idiopathic) without  complications of left lower 08/20/2020 No Yes extremity Inactive Problems ICD-10 Code Description Active Date Inactive Date I89.0 Lymphedema, not elsewhere classified 03/23/2020 03/23/2020 T81.31XD Disruption of external operation (surgical) wound, not elsewhere classified, subsequent 03/23/2020 03/23/2020 encounter S90.811D Abrasion, right foot, subsequent encounter 03/23/2020 03/23/2020 L97.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 layer exposed 03/23/2020 03/23/2020 L97.312 Non-pressure chronic ulcer of right ankle with fat layer exposed 03/23/2020 03/23/2020 L03.115 Cellulitis of right lower limb 04/07/2020 04/07/2020 L89.616 Pressure-induced deep tissue damage of right heel 05/05/2020 05/05/2020 Resolved Problems Electronic Signature(s) Signed: 09/14/2020 12:56:44 PM By: Geralyn Corwin DO Entered By: Geralyn Corwin on 09/14/2020 12:47:49 -------------------------------------------------------------------------------- Progress Note Details Patient Name: Date of Service: [name] 09/14/2020 10:45 A M Medical Record Number:  [id number] Patient Account Number: [id number] Date of Birth/Sex: Treating RN: [birth year] (60 y.o. [name] Primary Care Provider: [name] Other Clinician: Referring Provider: Treating Provider/Extender: Estill Bakes, [name] in Treatment: 25 Subjective Chief Complaint Information obtained from Patient Patient is here for multiple wounds on his bilateral feet and left ankle. 03/23/2020; patient is here for review of multiple wounds on his bilateral feet and ankles History of Present Illness (HPI) 11/23/15; this is a patient I remember from a previous stay in this clinic in 2012. The patient says this was for a wound in this same area as currently although I have no information to verify that. Most of the information is provided by his caregiver from the group home where he resides. Apparently he has had a wound in this area for 8 months. He also has erythema around this area and has had multiple rounds of antibiotics apparently with some improvement but the erythema is persists. He apparently had a fracture in the ankle 2 months ago and was seen by Dr. Lajoyce Corners . He apparently had a splint applied and more recently a heel offloading boot. He had home health coming out to a week ago not clear what they are dressing this with but they apparently discharged him perhaps because of one of Dr. 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 He has a Nurse, adult for transferring at home he is nonambulatory and does not wear footwear. He is not a diabetic. Looking through [facility] he appears to have fairly active primary care. The area on the ankle is variably labeled as a pressure area and/or chronic venous insufficiency. He had a recent venous ultrasound on 5/25 that did not show a DVT in the left leg however this was not a reflux study. There was no superficial DVT either. Arterial studies on 10/15/14 showed a left ABI of 1.16 Center  right of 1.12 waveforms were triphasic he does not have significant arterial disease. He has had recent x-rays of the left foot and ankle. The left foot did not show any abnormality. Left ankle x-ray showed a spiral fracture of the left tibial diaphysis without evidence of osteomyelitis. 11/30/15 culture I did last week showed pseudomonas I changed him to oral ciprofloxacin. Erythema is a lot better. 12/07/15 area around the wound shows considerable improvement of the periwound erythema. 12/14/15; the patient has a improvement of the periwound erythema which in retrospect I think with cellulitis successfully treated. We have been using Iodoflex started last week 12/25/15 wound on the left medial malleolus and dorsal left foot. Theraskin #1 applied READMISSION 02/27/16; the patient was admitted to hospital from 8/14 through 01/18/16 initially felt to have cellulitis of his  left lower leg however he was noted to have significant persistent pain and swelling. A duplex ultrasound was ordered that was negative. Obie Dredge an x-ray was done that showed a spiral fracture of the left leg. With posterior displacement and angulation at the mid left tibia. Orthopedics saw the patient and splinted the leg. He was then sent to [facility] skilled facility and only return to his group home last week. He has wounds on the left medial malleolus, left dorsal foot and a worrisome area on the plantar left heel which is not really open but may represent a hematoma or a DTI 03/06/16; areas of left anterior and medial ankle both look improved. There are 3 small wounds here. He has a area over the dorsal left first metatarsal head which is not open. In meantime his left heel has opened 03/14/16 the medial left ankle wounds looks stable to improved. The area on the posterior left heel has opened up and had a very adherent eschar and slept over this 03/21/16 patient has 3 wounds on his left medial ankle is stable area on his left  dorsal first metatarsal head appears to be improved. Continued problems with adherent eschar over the left heel 03/28/16; patient has 3 wounds on his left medial ankle and lower leg all of these appear to be stable. The problem continues to be the left heel and the adherent eschar. This may have been a pressure ulcer from her brace at one point. He has a English as a second language teacher we have been using [employer] and all wound areas. Dressings are being changed by home health 04/04/16; the patient's areas on the medial aspect of the left ankle looks superficial and appear to be gradually closing. The area on the plantar aspects/Achilles aspect of his left heel as surface slough and nonviable subcutaneous tissue requiring debridement. 04/11/16; the areas on the patient's medial left ankle appear to be superficial and appear to be closing. The area on the plantar heel on the left has surface slough and nonviable subcutaneous tissue requiring debridement. I don't think this is too much different from last week there is also undermining 04/25/16; patient is followed in his group home by [facility] home health. The area on the plantar heel is smaller but with still with some depth. We're using Iodoflex to this. The areas on the left ankle looks healthy but dry and somewhat larger. Cause of this is not really clear using Prisma 05/02/16; areas x3 all look improved expecially the areas on the medial ankle. Heel wound is smaller but still deep 05/09/16; all areas o3 look improved on the medial ankle. Heel wound is also contracting. 12/0.9/17 the areas on his left medial ankle are all closed. I think these are largely chronic venous insufficiency however there is no edema. The area on his left heel was a pressure area. READMISSION 08/02/16; this man is a patient that we have had in this clinic on several times before. I believe he has cerebral palsy is nonambulatory. He has chronic venous insufficiency with venous inflammation.  He was last year in the fall of 2017 with areas on his left medial ankle left heel and left dorsal foot. He arrives today with once again a vague history. As mentioned he is nonambulatory he spends most of his day in a pressure offloading boot nevertheless they noted wounds in this area on the medial left ankle 2 weeks ago. There is any other history here I am uncertain 08/15/16; the patient's wounds on his  medial ankle actually look better however the area on the left lateral/dorsal foot is covered in black necrotic eschar. Previous formal arterial studies did not show significant arterial disease with normal ABIs and triphasic waveforms. He is immobilized and at risk for pressure although he seems aware protective boots at all times as far as I am aware. 08/22/16; in general his wounds look better. All the wounds on his medial left ankle look improved the area on his lateral foot however continues to be problematic. Patient comes in the clinic today in a new wheelchair he is quite delighted 09/05/16; a wound on the left medial Calf and left dorsal foot are healed. The area on the left lateral foot is improved however the area on the left medial malleolus is inflamed with a considerable degree of surrounding erythema. This looks like cellulitis. ABIs and clinical exam are within normal limits 09/12/16; the patient continues to have a wound on the left medial calf. I was concerned about cellulitis in this area last week and gave him a course of doxycycline. The wound looks better here. The area on the left lateral foot however is unchanged 09/20/16; the patient came in this week early after traumatizing his toe on the left foot on a table while moving around in his motorized wheelchair. 09/26/16- he is here for follow-up evaluation of his left foot and malleolus ulcers. Since his appointment he abraded his left medial hallux against a table while in his motorized wheelchair, apparently trying to pull up to  the table for a meal. He continues to complain of tenderness to the left lateral foot, surrounding the current ulcer 10/07/16; left foot and malleolus ulcers. 10/14/16; the patient is doing well. He has a wound on the left lateral, left medial malleolus and atraumatic wound on the left great toe. Although these appear to be superficial and closing 10/31/16; the patient is doing well. The area on the left lateral foot, left medial malleolus and the left great toe or all epithelialized. He has home health 11/14/16; patient arrives today with the area on the left great toe healed. The left lateral foot however in the left medial malleolus and the ankle superficial wounds that are slightly larger. He has erythema and tenderness around the area on the left lateral foot. As well he is complaining of dysuria. His caregiver notes that when he gets like this he is more lethargic which indeed seems to be the case today 11/21/16; left great toe is still healed. His wound on the left lateral foot appears healthy and slightly smaller also the area on the left medial ankle. This had black eschar on it which I removed. The remaining wound looks satisfactory 12/12/16; patient hasn't been here in a number of weeks. The area on the left lateral foot is healed however on his medial ankle has 2 open areas one of them covered with a necrotic eschar. He also has a new wound on the right second toe which was apparently trauma induced while he was at a movie 12/27/16; the patient has wounds on his left medial mallelus and left lateral foot that are just about closed. The area on the right second toe which was trauma from last time is still open but again everything looks a lot better here. 01/30/17; patient arrives in clinic today and is really been a long time since I've seen him. The wounds are all epithelialized on the left foot this includes his left lateral foot, left medial malleolus. Apparently was  in the ER for what of I think  was a fungal infection on his buttock's he was prescribed Diflucan and doxycycline I think this is already getting better. READMISSION T is a now 60 year old man who has advanced cerebral palsy and functional quadriplegia. We have had him in this clinic multiple times before for wounds erry on his right ankle, left ankle, left dorsal foot and most recently from 08/02/16 through 01/30/17 with his left medial ankle and left foot and finally right second toe. His attendant from the group home where he lives is very knowledgeable about him. She states that they noticed breakdown in his skin in his feet sometime in January although they could not get an appointment until today in this clinic. The previous wounds have always been felt to be secondary to incidental trauma/pressure areas or possibly some degree of chronic venous insufficiency. He had normal arterial studies in may of 2016. He has definite open wounds on the right lateral foot over the base of the fifth metatarsal, the right second toe DIP, the left medial malleolus. He has concerning areas over the left lateral fifth metatarsal base, retaform purpura over the medial left ankle and perhaps the medial left first toe. 07/21/17; culture we did last week from the base of the right fifth metatarsal grew group G strep. This should've been sensitive to the doxycycline I gave him. The other major wounds are on the right second toe DIP, left medial malleolus, base of the left lateral fifth metatarsal and the medial left ankle and medial left toe. 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 We have macrovascular arterial studies scheduled for Thursday although I don't think this is what the problem is. 07/28/17; patient arrives with really no improvement. He has a wound on the right second toe, lateral right foot, right medial malleolus which was new this week, left medial malleolus, but looked like a DTI on the left great toe. His macrovascular  studies are on 07/30/17 although I would be surprised if this provides all the explanation. He has small areas even on his right second and third toe which look like splinter-type hemorrhages. The question I have is where is this coming from. I'd like to see the vascular studies however before we pursue this. He has had a DVT in the past but is no longer on anticoagulants. I'll need to review his history 08/04/17; the patient had his ARTERIAL STUDIES this showed a right ABI of 1.16 and the left ABI of 1.20. TBI on the left at 0.84. He is at a right first toe amputation. The studies were unchanged from studies in May 2016. It was not felt that he had significant lower extremity arterial disease In the meantime we are using silver alginate to the wounds on the right second toe right lateral foot left medial malleolus. I'm going to change to Silver collagen today. 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 Working him up for this would include an echocardiogram probably a CT scan of the chest and abdomen to look at the major thoracic and abdominal aortic vessels. I am not going to involve myself in this unless there is more symptoms/signs 08/18/17; the patient arrives today with wounds looking somewhat better. The remaining areas are the right second toe dorsal, right lateral foot and left medial malleolus which only has a small open area. We've been using silver collagen 09/01/17;right  second toe dorsally probes to bone today. This is a deterioration. We've been using silver collagen. Only a small open area remains on the left medial malleolus. 09/09/17; right second toe appears better. There is no probing bone. He has a small area remaining on the left medial malleolus, the right lateral foot. We have been using silver collagen 10/07/17; the patient returns to clinic today after a  month hiatus. He has wounds over the right second toe, right lateral foot and the left medial malleolus. We have been using silver collagen. He has home health. 10/24/17; patient has open wounds over the right second toe, now the left fourth toe and a superficial area over his left buttock. Some of the other areas have healed including the left medial malleolus and right lateral foot. Using silver collagen on these wounds 11/07/17; the patient has closed his wounds on the right second toe and left fourth toe although they look all normal. Last time he was here he had a superficial area over the left buttock this is also closed. The right lateral foot wound I had closed out last time although it is seems to have reopened today. 628/19 the areas over the right second and left fourth toe remains closed. The right lateral foot has tightly adherent necrotic surface over. We have been using silver collagen he offloads this and a bunny boot 12/12/17; the patient has reopened the recurrent area over the right second PIP joint. 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 He does not have obvious macrovascular disease. The area over the right lateral foot looks about the same as last time tightly adherent necrotic debris. We've been using Hydrofera Blue 12/25/17; 2 week follow-up. The area over the right second PIP is closed however the area on the right lateral foot is worse with surrounding erythema and pain compatible with cellulitis is going to need an antibiotic. ooAS WELL..... We have looked at his buttocks and lower back. He has 3 stage II ulcers. The exact history here is unclear however he is very disabled man with cerebral palsy. He goes to a day program from the group home in which she lives 5 days a week and I think he is on his buttock all day on these visits. Surrounding the wounds see has extensive stage I injury as well. ooFINALLY it looks as  though he has lost weight. I'm not the only staff member that is recognized this 01/08/18 on evaluation today patient appears to be doing better in regard to his gluteal wounds. In fact everything appears to be healed back here although very dry. He has a couple open sores on the scrotal region other than this it is really his foot that is still giving him the most trouble. He did complete the Keflex unfortunately it seems like there's still a lot of erythema in this in fact looks worse than it did previous. I'm gonna culture this today and likely start with a different antibiotic. 01/16/18; I was reassured that the wounds on his buttocks and sacrum are healed. I did not look at these the day. The culture that was done last week on 01/11/18 of the right lateral foot wounds showed Pseudomonas. Bactrim is not a good choice for this. Ciprofloxacin interacts with his muscle relaxants therefore I prescribed cefdinir 300 twice a day for 7 days. Bactrim coverage of pseudomonas is not reliable although the degree of erythema looks a lot better  01/30/18; the patient has completed his antibiotics. The area does not have infection on the right lateral foot. The wounds look a lot better. We've been using silver alginate 02/12/18; there is on the right lateral foot. Copious amounts of surface eschar. Also similarly over the PIP of the right second toe. We've been using silver alginate to date 03/02/2018; the area that remains is on the right lateral foot. The right second toe is healed. 03/16/2018; the area there is original wound on the right lateral foot. Still requiring debridement. We are using Hydrofera Blue. The right second toe is still healed/closed 03/30/2018; right lateral foot there is 2 small open areas remaining. They actually look quite healthy we have been using Hydrofera Blue the right second toe remains closed 04/13/2018; right lateral foot. 1 of the 2 small open areas from last time has closed over.  The other had surface slough that I removed with a #3 curette. This looks quite healthy. The right second toe remains closed. We are using Hydrofera Blue 05/01/2018; right lateral foot very superficial areas that look like they are trying to close over. He had a new threatened area on the left medial ankle just below the medial malleolus. I think these represent chronic venous insufficiency areas 05/14/2018; the right right lateral foot looks like it closed over as was predicted last time however he has some dark areas subcutaneously erythema superiorly and this is really very tender. This is either coexistent cellulitis or perhaps a deep tissue injury. The area on the left medial ankle is superficial but has some size. We have been using Hydrofera Blue to both areas. The patient complains of pain in his right foot 06/09/2018; right lateral foot was closed over last time but there was coexistent cellulitis around this. I gave him Levaquin. He also has an area on the left medial malleolus. He has not been back to clinic in over 3-1/2 weeks. We are using Hydrofera Blue I think to the left medial malleolus and silver alginate to the right lateral foot 1/21; right lateral foot still non-viable debris over this area and the left medial malleolus. Both of these vigorously washed off with antiseptic gauze. We have been using silver alginate. 2/4; right lateral foot this looks somewhat better. Unfortunately the area over the left medial malleolus has deteriorated. Using silver alginate in both area 2/18; both wounds arrived today covered in a lot of necrotic debris. We have been using silver alginate. Clearly not making a lot of progress. 3/3; the patient's right second toe is reopened over the PIP. I am not sure why this is happening. He may have microvascular disease. We have checked his macrovascular flow in the past and found it to be normal. He may need an x-ray. The area on the right lateral foot requires  debridement. The area on the left lateral ankle is worse. We have been using PolyMem Ag 08/26/18 on evaluation today patient appears to be doing a little worse in regard to his lower extremities based on what I'm seeing what the nursing staff is telling me. His left medial ankle appears potential to be infected there is your thing on want surrounding the wound. His right second toe is also of concern at this point in my pinion. We have not done x-rays of these areas that may be a good idea to go ahead and proceed with at this point to ensure will not deal with anything such as osteomyelitis. 4/16; it is been a while since  I saw this patient. He now has wounds on the right lateral foot, dorsal right second toe, substantially on the left medial malleolus and a small area on the left lateral foot. We have been using PolyMem Silver. Curlex and Coban. He has home health changing the dressing. Since last time he was here he had a culture of the left medial ankle that showed MRSA. He is completed a course/7 days of doxycycline. XRAYS of the left ankle and right foot that were ordered were negative for osteomyelitis 4/30; he arrives in clinic today with some maceration around the wound and a lot of tenderness and erythema in a circumferential area around the right lateral foot. He has wound areas on the right lateral foot dorsal right second toe, largely over the left medial malleolus and a small area over the left lateral foot 5/7; completed his antibiotics from last time right lateral foot cellulitis is better and the area on his left knee which we discovered after he had been seen by the provider also is improved. He still has extensive wound areas over the right lateral foot, left medial malleolus, right second toe and left lateral foot although all of these look somewhat better with PolyMem 5/15. Wounds on the right lateral foot, right second toe left medial malleolus and the left lateral foot. All of  these look somewhat better. We have been using PolyMem Ag 5/26; the wound on the right lateral foot is almost closed over but he still complains of a lot of pain in this area. He has a eschared area on the right second toe but no open wound.Geoffry Paradise concerning today his original wound on the left medial malleolus looks healthy however just inferior to this almost areas of deep tissue injury and there is an area on the left lateral foot also looks like a deep tissue injury. In the past I have wondered whether he might have cholesterol emboli or some other form of a vasculopathy. I wonder whether this could have something to do with this. 6/2; right lateral foot wound is still open I gave him empiric antibiotics last week without really a lot of change she is still complaining of pain in this area. We x- rayed his feet at some point in April these were negative. It would be difficult to do an MRI. The area on his left medial malleolus looks better. Right second toe is healed. He still has what looks to be a small pressure related area on the left lateral foot but it is not open. We are using PolyMem to all wound which seems to be doing nicely 6/16; right lateral foot wound is still open. The wound looks better although there is still a lot of tenderness around it. Rather than more empiric antibiotics I have cultured this today. We are using PolyMem Ag. On the left lateral malleolus the wound appears better. He has a new area on the left lower anterior pretibial area 6/29; 2-week follow-up. Culture I did of the right lateral foot 2 weeks ago showed MRSA. I gave him 2 weeks of Septra which he should be just about finishing now and this seems better. We got a call from home health last week to report swelling in the left leg because we were listed as his primary. We referred him back to primary care. He arrives today with intense erythema and tenderness around the 2 wounds on the left medial ankle on the  left lower calf. The area on the right  lateral foot looks better 7/7; I brought him back this week to follow-up on his left foot which had cellulitis last week. I gave him clindamycin which as far as I know he tolerated. The erythema is better. He has a 3 current wounds one on the right lateral foot a small area on the right lateral malleolus and the more recent area on the left anterior pretibial area. 7/21; bilateral distal lower leg and foot wounds. The area on his right lateral foot looks a lot better. He has an area on his left pretibial area distally. The area on the medial malleolus on the left which is 1 of his worst wounds has closed over. He does not appear to have any open areas in his remaining toes 8/4-Patient returns at 2 weeks, he has the remaining wound on the left lower leg the other 2 wounds have healed. Patient is continued to be treated with silver alginate with a 2 layer wrap on the left 8/18; I been following this patient for a long period for bilateral lower extremity wounds. 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 His wounds are probably pressure although he religiously wears thick bunny boots. He does not have macrovascular disease but I did formal testing on him perhaps 2 or 3 years ago. I have often wondered about either cholesterol emboli or a vasculopathy of some sort given the distal nature of these variable locations etc. He has 2 areas that we have been following one on the left medial lower leg and one on the right lateral foot. Both of these are mostly healed today there is some eschar over sections of both areas although I did not disturb this. 03/31/2019 on evaluation today patient appears to be doing a little bit more poorly in regard to the sacral region. He has 2 small wounds noted at this point. Fortunately there is no signs of active infection at this time. No fevers, chills, nausea, vomiting, or diarrhea. He  is having some pain when he sits on his gluteal region a big part of the reason he has these wounds is likely due to the fact that his wheelchair has not been functioning properly. He does still have a couple areas of deep tissue injury on his lower extremities he does need new Prevalon offloading boots as well. 04/28/2019 on evaluation today patient appears to be doing well with regard to his wounds in particular. His ankle area has fairly small based on what I am seeing currently and his sacral region actually is still open but does not appear to be doing too poorly in my opinion. Fortunately there is no signs of active infection at this time. He was in the hospital unfortunately and this was from 04/08/2019 through 04/12/2019 secondary to persistent diarrhea, dehydration and acute kidney injury. With that being said fortunately the patient does not seem to be doing too poorly at this time which is good news. He is having pain mainly in the sacral region. 06/23/2019 on evaluation today patient appears to be doing well with regard to his sacral region which in fact is completely healed. Unfortunately he has an area of rubbing/pressure on his right lateral chest/back region. This is where it is rubbing up on his chair. Subsequently he also has an open wound on the left medial malleolus which is not very open but starting to crack and then on the right lateral foot this is much more open at this point. Unfortunately  there is no signs of significant improvement in regard to these areas and in fact there does appear to be evidence of active infection quite significantly. No fevers, chills, nausea, vomiting, or diarrhea. The patient did test positive for COVID-19 that has been greater than 14 days ago although they did not know the exact timing. That is why he was not here for his last appointment. 07/07/2019 upon evaluation today patient actually appears to be doing quite well in regard to his left ankle as  well as the chest/back region which is doing great in fact appears to be healed in my opinion. He still has an issue on his lateral right foot that is open and appears to be infected but does have me more concerned. In fact I do believe the doxycycline helped in general for the cellulitis but I believe that may have been more MRSA related based on the culture that we did. What I am seeing right now has more of the blue-green drainage and may be more consistent with a Pseudomonas that I was hoping was not really causing much of an issue but I think it may be at this point on the right lateral foot. For that reason I do think treatment would be a good idea of the reason I avoided the Cipro before was the potential for QT prolongation which could obviously cause problems with the patient with results of interaction with respiratory wound. The tizanidine also had some interactions as well. Nonetheless I think at this point that it would be a possibility for [place] to use topical gentamicin to see if this could be of benefit for the patient underneath the silver alginate dressing. 07/21/2019 upon evaluation today patient appears to be doing fairly well at this point in regard to his lower extremity on the right. He does have a wound on his right lateral foot as well as the right fifth toe and the right fourth toe. Unfortunately he continues to experience significant issues with pressure and trauma to some degree and I think this is just due to the fact that again he is not really not able to control his legs and what is happening here. Fortunately there is no signs of active infection at this time. 08/04/2019 upon evaluation today patient actually appears to be doing quite well with regard to his toe ulcers I am very pleased in that regard. Unfortunately the foot ulcer is showing some signs of erythema his primary care provider has placed him on clindamycin at this point. Again I do believe this can be  beneficial for him. With that being said I do think the patient may benefit from a light Kerlix and Coban wrap to help with some of his edema at this point. 08/18/2019 upon evaluation today patient appears to be doing still poorly in regard to his lateral foot ulcer. This is not very deep but there is some concern about the possibility of osteomyelitis. I think it would be appropriate for [place] to go ahead and have an x-ray at this time. The patient is currently on clindamycin which from his culture which was performed on 06/23/2019 it does appear that he would benefit from that in regard to the MRSA. However Pseudomonas was also identified then and the patient actually has some blue-green drainage at this point as well. I am much more concerned about the possibility of the Pseudomonas being an infection is causative agent at this point that I am the MRSA. Subsequently Cipro the patient was  sensitive to but we never use that is stable use a topical gent due to the fact that unfortunately there was some interaction between the Cipro and the patient's risk for down potentially causing QT prolongation. 08/31/2019; have not seen this patient in quite a period of time. He has a substantial wound on his right lateral foot we have been using gentamicin ointment with Hydrofera Blue over the top. From what I am able to determine he is also on antibiotics perhaps clindamycin although I have not had a chance to look over this. He also has an eschared area on the right fifth toe as well as the right second toe. 4/20; right lateral foot wound. He comes in today with intense erythema and tenderness around this wound. The wound itself has a surface on it but it doesn't look particularly healthy a lot of gelatinous debris. There is no palpable bone. We've been using gentamicin ointment and Hydrofera Blue. Since the patient was last here he was seen by Dr. Algis Liming of infectious disease. He ordered an MRI that I think is due  later this month on 4/30. He apparently also checked a sedimentation rate and C-reactive protein metabolic panel CBC with differential. At the time he was seen he wanted to withhold any microbials pending identification of the depth of the infection although looking at this I don't know that I'm going to be able to hold off for another 10 days. 4/29; he went on to have the MRI that was ordered by Dr. Algis Liming of infectious disease. 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 I had given him cefdinir last week for surrounding cellulitis he is finishing this today. I have communicated with Dr. Algis Liming who wants bone for pathology and culture before proceeding with consideration of IV antibiotics. Fortunately he is due to see Dr. Samuella Cota of podiatry today I am hopeful that the bone biopsy and culture will be considered there 5/4 as I understand things Dr. Samuella Cota actually wants to remove the diseased proximal fifth metatarsal. Looking back at this decision is probably not a bad one. He has good blood flow he is nonambulatory they are apparently going to do this next week. Paradoxically the wound on the lateral part of his foot is actually a lot better. The cellulitis that was so problematic 2 weeks ago also has resolved. READMISSION 03/23/2020 T is now a 60 year old man he has cerebral palsy and is functionally paraplegic. The last time I saw him he had a diseased proximal fifth metatarsal. 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 Samuella Cota. Culture of this showed Pseudomonas and he was followed by infectious disease Dr. Algis Liming treated with ciprofloxacin. 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 He had Integra placed. A CandS showed Pseudomonas again. Since then he has had Silvadene as a primary dressing, then Santyl and more recently  Xeroform and then back to Santyl 3 times a week per his attendant who comes with him. Reviewing his records I was really not prepared for the number of wounds that he currently has. This includes substantial areas on the right lateral foot, right lateral ankle, right fourth toe dorsally, right ankle anteriorly as well as the left lateral ankle and foot. His medical history is unchanged ABIs 1.2 on the right and 1.21 on the left he has not  been known to have arterial insufficiency and is not a diabetic 11/5; patient I readmitted to the clinic last week. He has multiple wounds on the right lateral foot, right fourth toe and a new area on the right second toe. He has areas on the right dorsal ankle and a substantive wound over the left lateral ankle and foot. We are using silver collagen. We put him under compression last week and this seems to have helped. He has home health coming out one other time per week to change the dressing 11/12; no real improvement. Multiple bilateral foot and ankle wounds. ooOn the left he has a substantial wound on the left lateral ankle ooOn the right he has wounds on the dorsal second and fourth toes. ooRight lateral foot we have merged to wounds these are necrotic ooShe has an area on the right dorsal ankle and a area on the right medial malleolus with marked surrounding erythema likely cellulitis. The patient complains about pain that kept him awake all last night from wraps that were too tight. He is not known to have arterial insufficiency. 11/18; no real change. I gave him empiric Levaquin last week directed at previously cultured Pseudomonas this is not really helped. On the right he has wounds on the right lateral foot right lateral ankle right medial ankle and the dorsal second and fourth toes. Most of these do not have a nice have a healthy surface. There is erythema in his foot which may all be venous stasis. The area on the left lateral malleolus/ankle area  looks better 12/3; really disappointing if anything deterioration especially on the right lateral foot. These are necrotic. He also has areas on the right dorsal ankle right medial malleolus and the right second and fourth toes. On the left he has a large area over the left medial ankle. There is some erythema around the wound. Things are very tender I have reviewed his most recent arterial Dopplers which are actually in 2019 but he has never been felt to have arterial issues. At that time his ABI on the right was 1.16 [no TBI due to amputation] on the left his ABI was 1.20 with a great toe pressure of 0.24. Waveforms on the right and left toes were all normal he was not felt to have significant evidence of lower extremity arterial disease. ABIs in our clinic have always been normal his pulses have been palpable. He had an MRI of his right foot in April 2021 this showed a soft tissue ulceration along the lateral midfoot with underlying osteomyelitis no abscess. 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 He returned to our clinic on 03/23/2020. The same pattern of wounds that I am describing currently this was a lot worse than earlier in the year. I gave him empiric antibiotics because he had had Pseudomonas in the past this does not seem to have helped. We have not made any progress on any of these wound site. The patient is in a lot of pain 12/10; the patient comes in with a new deep tissue injury on the right heel large necrotic wound on the right lateral foot, smaller necrotic wound on the dorsal ankle and medial ankle. Necrotic wounds on the second and fourth toes. We have been using Hydrofera Blue On the left he has the area on the left little medial ankle which actually looks some better healthier looking tissue. We are using Hydrofera Blue here as well X-ray ordered last week showed no  osseous erosion no plain film indication of osteomyelitis. 12/17; PCR culture I did of the right lateral  foot wound last week showed a large titer of Enterococcus faecalis and low titers of of coag negative staph and Peptostreptococcus. I have started him on Augmentin for 2 weeks but I may continue beyond this. He has continuous complaints of pain now apparently making it difficult for him to rest at night. Substantial wound area in the right foot and ankle. Unfortunately the left medial foot seems to be more substantial this week. 12/27; in general the wounds on the right foot and ankle and the left medial ankle look better. There is certainly less erythema in the right foot. I have him on Augmentin and I am going to extend this for another week. MRI is booked for 06/01/2020 06/02/2020; patient's extensive skin breakdown on the right foot continues it is stable to perhaps slightly improved however there is a considerable amount of tissue breakdown. Unfortunately his MRI shows findings consistent with osteomyelitis in the lateral aspect of the cuboid. The overlying wound in this area is deep but less necrotic than I am used to seeing. He has area on the right posterior heel is now necrotic.. The area on the left lateral ankle/foot looks somewhat better. The patient states he continues to be in a lot of pain from the right foot and he is really tired of hurting from these wounds. I have discussed things with T before. He is anxious to be out of pain. He is nonambulatory. Furthermore the osteomyelitis in the right foot and the erry extensive wounds in this area are only a source of potential systemic infection. He seems in favor of proceeding with a below-knee amputation. I cannot argue with this we have not made a lot of progress in several months working on these wounds ever since his fifth ray amputation by Dr. Samuella Cota 1/28; in general the patient's right foot looks better although there is several wounds still present there were certainly look less angry. The area on the right lateral foot looks better he  still has a necrotic area on the right Achilles heel but this is separating the right fourth toe and the right medial malleolus. We have been using silver alginate with Bactroban on all of these wounds. On the left medial ankle the wound looks healthy we have been using Hydrofera Blue. He has an appointment I think on Monday with Dr. Victorino Dike with regards to a BKA. Although the wounds on the right foot look better than they did heat the patient still relates that he has occasional pain and he suffered with this through the years he would rather have a below-knee amputation. Provided Dr. Victorino Dike agrees with this he is anxious to proceed 2/11; the patient continues to make decent progress with the area on the left medial ankle. We are using Hydrofera Blue in this area. Although retreating the right foot wounds palliatively he continues to make improvement in the right lateral dorsal foot, right heel and the wounds on his toes. He saw Dr. Victorino Dike to also concurred with the amputation directed has underlying osteomyelitis. The patient has suffered a great deal with the pain in this foot from recurrent wounds although the wounds currently are making progress I do not think this decision is the wrong one. He is nonambulatory and has underlying osteomyelitis 2/25; the left medial ankle wound is much better. We have been using Hydrofera Blue on this home health changing the dressing ooAmputation with  Dr. Victorino Dike on 07/27/2020. The wounds on his lateral foot are necrotic. The rest of the multiple wounds look the same. 3/18; the patient had his amputation on 07/27/2020. He was sent home the same day however he required readmission from 07/31/2020 2 through 08/03/2020 caused by community-acquired pneumonia, nausea and vomiting. He comes back in today for follow-up. The story we are getting from his caregiver is that he saw his primary doctor earlier this week and was put on Augmentin for cellulitis of the lower leg on the  left. Indeed the last time we saw this patient this was just about healed it is much larger today. He also was not put in compression which could have resulted in breakdown as well as infection 3/25; again a very different looking wound on the left medial ankle than what we've been used to seeing. We used Hydrofera Blue here under compression however the wound is larger albeit superficial. No obvious infection 4/7; left medial ankle area still somewhat inflamed but a lot better than last time. There is perhaps 2 tiny open areas remaining in this area this is just about closed we have been using TCA silver alginate Kerlix Coban. We were not made aware of any problems with the right stump we did not look either 4/21; patient presents for evaluation of his left medial ankle. He states that the wounds are closed. He has had no issues since last time he was in our clinic. He hoping for discharge today. Patient History Information obtained from Patient. Family History Unknown History. Social History Never smoker, Marital Status - Single, Alcohol Use - Never, Drug Use - No History, Caffeine Use - Moderate. Medical History Eyes Denies history of Cataracts, Glaucoma, Optic Neuritis Ear/Nose/Mouth/Throat Denies history of Chronic sinus problems/congestion, Middle ear problems Hematologic/Lymphatic Patient has history of Anemia - microcytic Cardiovascular Patient has history of Deep Vein Thrombosis Gastrointestinal Patient has history of Colitis Endocrine Denies history of Type I Diabetes, Type II Diabetes Genitourinary Denies history of End Stage Renal Disease Immunological Denies history of Lupus Erythematosus, Raynaudoos, Scleroderma Integumentary (Skin) Denies history of History of Burn Musculoskeletal Patient has history of Osteomyelitis Neurologic Patient has history of Neuropathy, Quadriplegia - spastic Oncologic Denies history of Received Chemotherapy, Received  Radiation Psychiatric Patient has history of Confinement Anxiety Denies history of Anorexia/bulimia Hospitalization/Surgery History - surgery to straughten legs. 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 - hiatal hernia repair. - (R) gt toe amputation. - Constipation. 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 - Pneumonia. Medical A Surgical History Notes nd Co-Morbid Conditions h/o spiral fracture distal (L) tibia , h/o shingles , MR , infantile CP Ear/Nose/Mouth/Throat hiatal hernia Respiratory h/o pneumonia , chronic bronchitis Cardiovascular hypercholesterolemia Gastrointestinal acute gastric ulcer with hemorrhage , constipation , GERD , Abdominal Distention Genitourinary incontinence (on Detrol) Renal Glycosuria Integumentary (Skin) Eczema Acne Vulgaris Onychomycosis Pressure Ulcers Musculoskeletal DJD of shoulder Neurologic cerebral palsy Psychiatric general anxiety , bipolar , depression, Mental Retardation Objective Constitutional respirations regular, non-labored and within target range for patient.. Vitals Time Taken: 11:06 AM, Temperature: 97.6 F, Pulse: 73 bpm, Respiratory Rate: 16 breaths/min, Blood Pressure: 115/84 mmHg. Psychiatric pleasant and cooperative. General Notes: Left medial foot and ankle: Wound site is epithelialized. He has good edema control. Integumentary (Hair, Skin) Wound #47 status is Open. Original cause of wound was Gradually Appeared. The date acquired was: 05/22/2020. The wound has been in treatment 16 weeks. The wound is located on the Left,Medial Malleolus. The wound measures 0cm length x 0cm width x  0cm depth; 0cm^2 area and 0cm^3 volume. Assessment Active Problems ICD-10 Non-pressure chronic ulcer of left ankle with fat layer exposed Chronic venous hypertension (idiopathic) without complications of left lower extremity Patient's left ankle wound has epithelialized. He has his TED hose today and I recommended he wear this daily except for at  night. Plan Discharge From [facility] Services: Discharge from Wound Care Center - Call if any future wound care needs. Discontinue all previous wound care orders. Edema Control - Lymphedema / SCD / Other: Patient to wear own compression stockings every day. - Apply stocking to left leg every morning and remove at night. Moisturize legs daily. - left leg every night before bed. Home Health: Discontinue home health for wound care. - [facility]. Discharge from our clinic Patient was told to follow-up as needed and call us with any questions or concerns Electronic Signature(s) Signed: 09/14/2020 12:56:44 PM By: Geralyn Corwin DO Entered By: Geralyn Corwin on 09/14/2020 12:55:21 -------------------------------------------------------------------------------- HxROS Details Patient Name: Date of Service: [name]. 09/14/2020 10:45 A M Medical Record Number: [id number] Patient Account Number: [id number] Date of Birth/Sex: Treating RN: 1960-09-02 (60 y.o. [name] Primary Care Provider: [name] Other Clinician: Referring Provider: Treating Provider/Extender: Estill Bakes, [name] in Treatment: 25 Information Obtained From Patient Co-Morbid Conditions Medical History: Past Medical History Notes: h/o spiral fracture distal (L) tibia , h/o shingles , MR , infantile CP Eyes Medical History: Negative for: Cataracts; Glaucoma; Optic Neuritis Ear/Nose/Mouth/Throat Medical History: Negative for: Chronic sinus problems/congestion; Middle ear problems Past Medical History Notes: hiatal hernia Hematologic/Lymphatic Medical History: Positive for: Anemia - microcytic Respiratory Medical History: Past Medical History Notes: h/o pneumonia , chronic bronchitis Cardiovascular Medical History: Positive for: Deep Vein Thrombosis Past Medical History Notes: hypercholesterolemia Gastrointestinal Medical History: Positive for: Colitis Past  Medical History Notes: acute gastric ulcer with hemorrhage , constipation , GERD , Abdominal Distention Endocrine Medical History: Negative for: Type I Diabetes; Type II Diabetes Genitourinary Medical History: Negative for: End Stage Renal Disease Past Medical History Notes: incontinence (on Detrol) Renal Glycosuria Immunological Medical History: Negative for: Lupus Erythematosus; Raynauds; Scleroderma Integumentary (Skin) Medical History: Negative for: History of Burn Past Medical History Notes: Eczema Acne Vulgaris Onychomycosis Pressure Ulcers Musculoskeletal Medical History: Positive for: Osteomyelitis Past Medical History Notes: DJD of shoulder Neurologic Medical History: Positive for: Neuropathy; Quadriplegia - spastic Past Medical History Notes: cerebral palsy Oncologic Medical History: Negative for: Received Chemotherapy; Received Radiation Psychiatric Medical History: Positive for: Confinement Anxiety Negative for: Anorexia/bulimia Past Medical History Notes: general anxiety , bipolar , depression, Mental Retardation Immunizations Pneumococcal Vaccine: Received Pneumococcal Vaccination: Yes Implantable Devices None Hospitalization / Surgery History Type of Hospitalization/Surgery surgery to straughten legs 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) hiatal hernia repair (R) gt toe amputation Constipation 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 Pneumonia Family and Social History Unknown History: Yes; Never smoker; Marital Status - Single; Alcohol Use: Never; Drug Use: No History; Caffeine Use: Moderate; Financial Concerns: No; Food, Clothing or Shelter Needs: No; Support System Lacking: No; Transportation Concerns: No Electronic Signature(s) Signed: 09/14/2020 12:56:44 PM By: Geralyn Corwin DO Signed: 09/14/2020 5:08:10 PM By: Antonieta Iba Entered By: Geralyn Corwin on 09/14/2020  12:53:26 -------------------------------------------------------------------------------- SuperBill Details Patient Name: Date of Service: [name] 09/14/2020 Medical Record Number: [id number] Patient Account Number: [id number] Date of Birth/Sex: Treating RN: [birth year] (59 y.o. [name] Primary Care Provider: [name] Other Clinician: Referring Provider: Treating Provider/Extender: Estill Bakes, [name] in Treatment: 25 Diagnosis Coding ICD-10 Codes  Code Description L97.322 Non-pressure chronic ulcer of left ankle with fat layer exposed I87.302 Chronic venous hypertension (idiopathic) without complications of left lower extremity Facility Procedures CPT4 Code: 16109604 Description: 99213 - WOUND CARE VISIT-LEV 3 EST PT Modifier: Quantity: 1 Electronic Signature(s) Signed: 09/14/2020 12:56:44 PM By: Geralyn Corwin DO Entered By: Geralyn Corwin on 09/14/2020 12:56:09

## 2020-09-19 NOTE — Progress Notes (Signed)
MALAQUIAS, LENKER ([id number]) Visit Report for 09/14/2020 Arrival Information Details Patient Name: Date of Service: [name], [name] 09/14/2020 10:45 A M Medical Record Number: [id number] Patient Account Number: [id number] Date of Birth/Sex: Treating RN: [birth year] (60 y.o. Lytle Michaels Primary Care Keoni Havey: [name] Other Clinician: Referring Thaddus Mcdowell: Treating Aava Deland/Extender: Estill Bakes, [name] in Treatment: 25 Visit Information History Since Last Visit Added or deleted any medications: No Patient Arrived: Wheel Chair Any new allergies or adverse reactions: No Arrival Time: 11:02 Had a fall or experienced change in No Accompanied By: caregiver activities of daily living that may affect Transfer Assistance: None risk of falls: Patient Identification Verified: Yes Signs or symptoms of abuse/neglect since last visito No Secondary Verification Process Completed: Yes Hospitalized since last visit: Yes Patient Requires Transmission-Based Precautions: No Implantable device outside of the clinic excluding No Patient Has Alerts: No cellular tissue based products placed in the center since last visit: Has Dressing in Place as Prescribed: Yes Has Compression in Place as Prescribed: Yes Pain Present Now: No Electronic Signature(s) Signed: 09/14/2020 5:08:10 PM By: Antonieta Iba Entered By: Antonieta Iba on 09/14/2020 11:06:21 -------------------------------------------------------------------------------- Clinic Level of Care Assessment Details Patient Name: Date of Service: [name], [name] 09/14/2020 10:45 A M Medical Record Number: [id number] Patient Account Number: [id number] Date of Birth/Sex: Treating RN: 1960/07/09 (61 y.o. [name] Primary Care Kendry Pfarr: [name] Other Clinician: Referring Anwar Sakata: Treating Reeve Mallo/Extender: Estill Bakes, [name] in Treatment: 25 Clinic Level of Care Assessment  Items TOOL 4 Quantity Score X- 1 0 Use when only an EandM is performed on FOLLOW-UP visit ASSESSMENTS - Nursing Assessment / Reassessment X- 1 10 Reassessment of Co-morbidities (includes updates in patient status) X- 1 5 Reassessment of Adherence to Treatment Plan ASSESSMENTS - Wound and Skin A ssessment / Reassessment X - Simple Wound Assessment / Reassessment - one wound 1 5 []  - 0 Complex Wound Assessment / Reassessment - multiple wounds X- 1 10 Dermatologic / Skin Assessment (not related to wound area) ASSESSMENTS - Focused Assessment X- 1 5 Circumferential Edema Measurements - multi extremities X- 1 10 Nutritional Assessment / Counseling / Intervention []  - 0 Lower Extremity Assessment (monofilament, tuning fork, pulses) []  - 0 Peripheral Arterial Disease Assessment (using hand held doppler) ASSESSMENTS - Ostomy and/or Continence Assessment and Care []  - 0 Incontinence Assessment and Management []  - 0 Ostomy Care Assessment and Management (repouching, etc.) PROCESS - Coordination of Care X - Simple Patient / Family Education for ongoing care 1 15 []  - 0 Complex (extensive) Patient / Family Education for ongoing care X- 1 10 Staff obtains , Records, T Results / Process Orders est X- 1 10 Staff telephones HHA, Nursing Homes / Clarify orders / etc []  - 0 Routine Transfer to another Facility (non-emergent condition) []  - 0 Routine Hospital Admission (non-emergent condition) []  - 0 New Admissions / / Ordering NPWT Apligraf, etc. , []  - 0 Emergency Hospital Admission (emergent condition) X- 1 10 Simple Discharge Coordination []  - 0 Complex (extensive) Discharge Coordination PROCESS - Special Needs []  - 0 Pediatric / Minor Patient Management []  - 0 Isolation Patient Management []  - 0 Hearing / Language / Visual special needs []  - 0 Assessment of Community assistance (transportation, D/C planning, etc.) []  - 0 Additional  assistance / Altered mentation []  - 0 Support Surface(s) Assessment (bed, cushion, seat, etc.) INTERVENTIONS - Wound Cleansing / Measurement X - Simple Wound Cleansing - one wound 1 5 []  - 0  MALAQUIAS, LENKER ([id number]) Visit Report for 09/14/2020 Arrival Information Details Patient Name: Date of Service: [name], [name] 09/14/2020 10:45 A M Medical Record Number: [id number] Patient Account Number: [id number] Date of Birth/Sex: Treating RN: [birth year] (60 y.o. Lytle Michaels Primary Care Keoni Havey: [name] Other Clinician: Referring Thaddus Mcdowell: Treating Aava Deland/Extender: Estill Bakes, [name] in Treatment: 25 Visit Information History Since Last Visit Added or deleted any medications: No Patient Arrived: Wheel Chair Any new allergies or adverse reactions: No Arrival Time: 11:02 Had a fall or experienced change in No Accompanied By: caregiver activities of daily living that may affect Transfer Assistance: None risk of falls: Patient Identification Verified: Yes Signs or symptoms of abuse/neglect since last visito No Secondary Verification Process Completed: Yes Hospitalized since last visit: Yes Patient Requires Transmission-Based Precautions: No Implantable device outside of the clinic excluding No Patient Has Alerts: No cellular tissue based products placed in the center since last visit: Has Dressing in Place as Prescribed: Yes Has Compression in Place as Prescribed: Yes Pain Present Now: No Electronic Signature(s) Signed: 09/14/2020 5:08:10 PM By: Antonieta Iba Entered By: Antonieta Iba on 09/14/2020 11:06:21 -------------------------------------------------------------------------------- Clinic Level of Care Assessment Details Patient Name: Date of Service: [name], [name] 09/14/2020 10:45 A M Medical Record Number: [id number] Patient Account Number: [id number] Date of Birth/Sex: Treating RN: 1960/07/09 (61 y.o. [name] Primary Care Kendry Pfarr: [name] Other Clinician: Referring Anwar Sakata: Treating Reeve Mallo/Extender: Estill Bakes, [name] in Treatment: 25 Clinic Level of Care Assessment  Items TOOL 4 Quantity Score X- 1 0 Use when only an EandM is performed on FOLLOW-UP visit ASSESSMENTS - Nursing Assessment / Reassessment X- 1 10 Reassessment of Co-morbidities (includes updates in patient status) X- 1 5 Reassessment of Adherence to Treatment Plan ASSESSMENTS - Wound and Skin A ssessment / Reassessment X - Simple Wound Assessment / Reassessment - one wound 1 5 []  - 0 Complex Wound Assessment / Reassessment - multiple wounds X- 1 10 Dermatologic / Skin Assessment (not related to wound area) ASSESSMENTS - Focused Assessment X- 1 5 Circumferential Edema Measurements - multi extremities X- 1 10 Nutritional Assessment / Counseling / Intervention []  - 0 Lower Extremity Assessment (monofilament, tuning fork, pulses) []  - 0 Peripheral Arterial Disease Assessment (using hand held doppler) ASSESSMENTS - Ostomy and/or Continence Assessment and Care []  - 0 Incontinence Assessment and Management []  - 0 Ostomy Care Assessment and Management (repouching, etc.) PROCESS - Coordination of Care X - Simple Patient / Family Education for ongoing care 1 15 []  - 0 Complex (extensive) Patient / Family Education for ongoing care X- 1 10 Staff obtains , Records, T Results / Process Orders est X- 1 10 Staff telephones HHA, Nursing Homes / Clarify orders / etc []  - 0 Routine Transfer to another Facility (non-emergent condition) []  - 0 Routine Hospital Admission (non-emergent condition) []  - 0 New Admissions / / Ordering NPWT Apligraf, etc. , []  - 0 Emergency Hospital Admission (emergent condition) X- 1 10 Simple Discharge Coordination []  - 0 Complex (extensive) Discharge Coordination PROCESS - Special Needs []  - 0 Pediatric / Minor Patient Management []  - 0 Isolation Patient Management []  - 0 Hearing / Language / Visual special needs []  - 0 Assessment of Community assistance (transportation, D/C planning, etc.) []  - 0 Additional  assistance / Altered mentation []  - 0 Support Surface(s) Assessment (bed, cushion, seat, etc.) INTERVENTIONS - Wound Cleansing / Measurement X - Simple Wound Cleansing - one wound 1 5 []  - 0  Notes Electronic Signature(s) Signed: 09/14/2020 12:56:44 PM By: Geralyn CorwinHoffman, Jessica DO Signed: 09/14/2020 5:27:07 PM By: Shawn Stalleaton, Bobbi Entered By: Geralyn CorwinHoffman, Jessica on 09/14/2020 12:47:57 -------------------------------------------------------------------------------- Multi-Disciplinary Care Plan Details Patient Name: Date of Service: Carola RhineHUMBLE, Pierce L. 09/14/2020 10:45 A M Medical Record Number: [id number] Patient Account Number: [id number] Date of Birth/Sex: Treating RN: 1961-02-09 (60 y.o. Tammy SoursM) Deaton, Bobbi Primary Care Tyona Nilsen: Jamison OkaBakare, Mobolaji Other Clinician: Referring Derrill Bagnell: Treating Coreena Rubalcava/Extender: Estill BakesHoffman, Jessica Bakare, [name] in Treatment: 25 Active Inactive Electronic Signature(s) Signed: 09/14/2020 5:27:07 PM By: Shawn Stalleaton, Bobbi Entered By: Shawn Stalleaton, Bobbi on 09/14/2020 12:44:15 -------------------------------------------------------------------------------- Pain Assessment Details Patient Name: Date of Service: Carola RhineHUMBLE, Orbin L. 09/14/2020 10:45 A M Medical Record Number: [id number] Patient Account Number: [id number] Date of Birth/Sex: Treating RN: 1961-02-09 (60 y.o. Lytle MichaelsM) Barnhart, Jodi Primary Care Jeneal Vogl: Jamison OkaBakare, Mobolaji Other Clinician: Referring  Jerimey Burridge: Treating Kem Hensen/Extender: Estill BakesHoffman, Jessica Bakare, [name] in Treatment: 25 Active Problems Location of Pain Severity and Description of Pain Patient Has Paino No Site Locations Pain Management and Medication Current Pain Management: Electronic Signature(s) Signed: 09/14/2020 5:08:10 PM By: Antonieta IbaBarnhart, Jodi Entered By: Antonieta IbaBarnhart, Jodi on 09/14/2020 11:09:10 -------------------------------------------------------------------------------- Patient/Caregiver Education Details Patient Name: Date of Service: Carola RhineHUMBLE, Keaden L. 4/21/2022andnbsp10:45 A M Medical Record Number: [id number] Patient Account Number: [id number] Date of Birth/Gender: Treating RN: 1961-02-09 (60 y.o. Tammy SoursM) Deaton, Bobbi Primary Care Physician: Jamison OkaBakare, Mobolaji Other Clinician: Referring Physician: Treating Physician/Extender: Ortencia KickHoffman, Jessica Bakare, [name] in Treatment: 25 Education Assessment Education Provided To: Patient Education Topics Provided Wound/Skin Impairment: Handouts: Skin Care Do's and Dont's Methods: Explain/Verbal Responses: Reinforcements needed Electronic Signature(s) Signed: 09/14/2020 5:27:07 PM By: Shawn Stalleaton, Bobbi Entered By: Shawn Stalleaton, Bobbi on 09/14/2020 11:15:16 -------------------------------------------------------------------------------- Wound Assessment Details Patient Name: Date of Service: Carola RhineHUMBLE, Grigor L. 09/14/2020 10:45 A M Medical Record Number: [id number] Patient Account Number: [id number] Date of Birth/Sex: Treating RN: 1961-02-09 (60 y.o. Lytle MichaelsM) Barnhart, Jodi Primary Care Stanislaw Acton: Jamison OkaBakare, Mobolaji Other Clinician: Referring Sung Renton: Treating Hartman Minahan/Extender: Estill BakesHoffman, Jessica Bakare, [name] in Treatment: 25 Wound Status Wound Number: [id number] Primary Venous Leg Ulcer Etiology: Wound Location: Left, Medial Malleolus Wound Healed - Epithelialized Wounding Event: Gradually Appeared Status: Date Acquired: 05/22/2020 Comorbid Anemia, Deep  Vein Thrombosis, Colitis, Osteomyelitis, Weeks Of Treatment: 16 History: Neuropathy, Quadriplegia, Confinement Anxiety Clustered Wound: No Photos Wound Measurements Length: (cm) Width: (cm) Depth: (cm) Area: (cm) Volume: (cm) 0 % Reduction in Area: 100% 0 % Reduction in Volume: 100% 0 Epithelialization: Large (67-100%) 0 0 Wound Description Classification: Full Thickness Without Exposed Support Structures Wound Margin: Distinct, outline attached Exudate Amount: Medium Exudate Type: Serosanguineous Exudate Color: red, brown Foul Odor After Cleansing: No Slough/Fibrino No Wound Bed Granulation Amount: Large (67-100%) Exposed Structure Granulation Quality: Red, Pink Fascia Exposed: No Necrotic Amount: None Present (0%) Fat Layer (Subcutaneous Tissue) Exposed: Yes Tendon Exposed: No Muscle Exposed: No Joint Exposed: No Bone Exposed: No Electronic Signature(s) Signed: 09/14/2020 5:08:10 PM By: Antonieta IbaBarnhart, Jodi Signed: 09/19/2020 2:09:33 PM By: Karl Itoawkins, Destiny Entered By: Karl Itoawkins, Destiny on 09/14/2020 16:56:29 -------------------------------------------------------------------------------- Vitals Details Patient Name: Date of Service: Carola RhineHUMBLE, Rajendra L. 09/14/2020 10:45 A M Medical Record Number: [id number] Patient Account Number: [id number] Date of Birth/Sex: Treating RN: 1961-02-09 (59 y.o. Lytle MichaelsM) Barnhart, Jodi Primary Care Sahiti Joswick: Jamison OkaBakare, Mobolaji Other Clinician: Referring Sylvan Sookdeo: Treating Anabelen Kaminsky/Extender: Estill BakesHoffman, Jessica Bakare, [name] in Treatment: 25 Vital Signs Time Taken: 11:06 Temperature (F): 97.6 Pulse (bpm): 73 Respiratory Rate (breaths/min): 16 Blood Pressure (mmHg): 115/84 Reference Range: 80 - 120 mg / dl Electronic Signature(s) Signed: 09/14/2020 5:08:10 PM By: Antonieta IbaBarnhart, Jodi Entered By: Antonieta IbaBarnhart, Jodi  MALAQUIAS, LENKER ([id number]) Visit Report for 09/14/2020 Arrival Information Details Patient Name: Date of Service: [name], [name] 09/14/2020 10:45 A M Medical Record Number: [id number] Patient Account Number: [id number] Date of Birth/Sex: Treating RN: [birth year] (60 y.o. Lytle Michaels Primary Care Keoni Havey: [name] Other Clinician: Referring Thaddus Mcdowell: Treating Aava Deland/Extender: Estill Bakes, [name] in Treatment: 25 Visit Information History Since Last Visit Added or deleted any medications: No Patient Arrived: Wheel Chair Any new allergies or adverse reactions: No Arrival Time: 11:02 Had a fall or experienced change in No Accompanied By: caregiver activities of daily living that may affect Transfer Assistance: None risk of falls: Patient Identification Verified: Yes Signs or symptoms of abuse/neglect since last visito No Secondary Verification Process Completed: Yes Hospitalized since last visit: Yes Patient Requires Transmission-Based Precautions: No Implantable device outside of the clinic excluding No Patient Has Alerts: No cellular tissue based products placed in the center since last visit: Has Dressing in Place as Prescribed: Yes Has Compression in Place as Prescribed: Yes Pain Present Now: No Electronic Signature(s) Signed: 09/14/2020 5:08:10 PM By: Antonieta Iba Entered By: Antonieta Iba on 09/14/2020 11:06:21 -------------------------------------------------------------------------------- Clinic Level of Care Assessment Details Patient Name: Date of Service: [name], [name] 09/14/2020 10:45 A M Medical Record Number: [id number] Patient Account Number: [id number] Date of Birth/Sex: Treating RN: 1960/07/09 (61 y.o. [name] Primary Care Kendry Pfarr: [name] Other Clinician: Referring Anwar Sakata: Treating Reeve Mallo/Extender: Estill Bakes, [name] in Treatment: 25 Clinic Level of Care Assessment  Items TOOL 4 Quantity Score X- 1 0 Use when only an EandM is performed on FOLLOW-UP visit ASSESSMENTS - Nursing Assessment / Reassessment X- 1 10 Reassessment of Co-morbidities (includes updates in patient status) X- 1 5 Reassessment of Adherence to Treatment Plan ASSESSMENTS - Wound and Skin A ssessment / Reassessment X - Simple Wound Assessment / Reassessment - one wound 1 5 []  - 0 Complex Wound Assessment / Reassessment - multiple wounds X- 1 10 Dermatologic / Skin Assessment (not related to wound area) ASSESSMENTS - Focused Assessment X- 1 5 Circumferential Edema Measurements - multi extremities X- 1 10 Nutritional Assessment / Counseling / Intervention []  - 0 Lower Extremity Assessment (monofilament, tuning fork, pulses) []  - 0 Peripheral Arterial Disease Assessment (using hand held doppler) ASSESSMENTS - Ostomy and/or Continence Assessment and Care []  - 0 Incontinence Assessment and Management []  - 0 Ostomy Care Assessment and Management (repouching, etc.) PROCESS - Coordination of Care X - Simple Patient / Family Education for ongoing care 1 15 []  - 0 Complex (extensive) Patient / Family Education for ongoing care X- 1 10 Staff obtains , Records, T Results / Process Orders est X- 1 10 Staff telephones HHA, Nursing Homes / Clarify orders / etc []  - 0 Routine Transfer to another Facility (non-emergent condition) []  - 0 Routine Hospital Admission (non-emergent condition) []  - 0 New Admissions / / Ordering NPWT Apligraf, etc. , []  - 0 Emergency Hospital Admission (emergent condition) X- 1 10 Simple Discharge Coordination []  - 0 Complex (extensive) Discharge Coordination PROCESS - Special Needs []  - 0 Pediatric / Minor Patient Management []  - 0 Isolation Patient Management []  - 0 Hearing / Language / Visual special needs []  - 0 Assessment of Community assistance (transportation, D/C planning, etc.) []  - 0 Additional  assistance / Altered mentation []  - 0 Support Surface(s) Assessment (bed, cushion, seat, etc.) INTERVENTIONS - Wound Cleansing / Measurement X - Simple Wound Cleansing - one wound 1 5 []  - 0

## 2020-10-02 ENCOUNTER — Inpatient Hospital Stay (HOSPITAL_COMMUNITY)
Admission: EM | Admit: 2020-10-02 | Discharge: 2020-10-06 | DRG: 177 | Disposition: A | Discharge status: Skilled Nursing Facility | Payer: Medicare Other | Attending: Internal Medicine | Admitting: Internal Medicine

## 2020-10-02 ENCOUNTER — Emergency Department (HOSPITAL_COMMUNITY): Payer: Medicare Other

## 2020-10-02 ENCOUNTER — Other Ambulatory Visit: Payer: Self-pay

## 2020-10-02 DIAGNOSIS — F79 Unspecified intellectual disabilities: Secondary | ICD-10-CM | POA: Diagnosis present

## 2020-10-02 DIAGNOSIS — Z66 Do not resuscitate: Secondary | ICD-10-CM | POA: Diagnosis present

## 2020-10-02 DIAGNOSIS — F319 Bipolar disorder, unspecified: Secondary | ICD-10-CM | POA: Diagnosis present

## 2020-10-02 DIAGNOSIS — K219 Gastro-esophageal reflux disease without esophagitis: Secondary | ICD-10-CM | POA: Diagnosis present

## 2020-10-02 DIAGNOSIS — L89153 Pressure ulcer of sacral region, stage 3: Secondary | ICD-10-CM | POA: Diagnosis present

## 2020-10-02 DIAGNOSIS — R0902 Hypoxemia: Secondary | ICD-10-CM | POA: Diagnosis present

## 2020-10-02 DIAGNOSIS — Z888 Allergy status to other drugs, medicaments and biological substances status: Secondary | ICD-10-CM

## 2020-10-02 DIAGNOSIS — Z79899 Other long term (current) drug therapy: Secondary | ICD-10-CM

## 2020-10-02 DIAGNOSIS — Z89511 Acquired absence of right leg below knee: Secondary | ICD-10-CM

## 2020-10-02 DIAGNOSIS — R1310 Dysphagia, unspecified: Secondary | ICD-10-CM

## 2020-10-02 DIAGNOSIS — L89223 Pressure ulcer of left hip, stage 3: Secondary | ICD-10-CM | POA: Diagnosis present

## 2020-10-02 DIAGNOSIS — Z8701 Personal history of pneumonia (recurrent): Secondary | ICD-10-CM

## 2020-10-02 DIAGNOSIS — G809 Cerebral palsy, unspecified: Secondary | ICD-10-CM | POA: Diagnosis present

## 2020-10-02 DIAGNOSIS — J690 Pneumonitis due to inhalation of food and vomit: Secondary | ICD-10-CM | POA: Diagnosis not present

## 2020-10-02 DIAGNOSIS — R079 Chest pain, unspecified: Secondary | ICD-10-CM

## 2020-10-02 DIAGNOSIS — E7800 Pure hypercholesterolemia, unspecified: Secondary | ICD-10-CM | POA: Diagnosis present

## 2020-10-02 DIAGNOSIS — Z20822 Contact with and (suspected) exposure to covid-19: Secondary | ICD-10-CM | POA: Diagnosis present

## 2020-10-02 DIAGNOSIS — R532 Functional quadriplegia: Secondary | ICD-10-CM | POA: Diagnosis present

## 2020-10-02 DIAGNOSIS — F429 Obsessive-compulsive disorder, unspecified: Secondary | ICD-10-CM | POA: Diagnosis present

## 2020-10-02 DIAGNOSIS — R918 Other nonspecific abnormal finding of lung field: Secondary | ICD-10-CM | POA: Diagnosis present

## 2020-10-02 DIAGNOSIS — Z7401 Bed confinement status: Secondary | ICD-10-CM

## 2020-10-02 DIAGNOSIS — J42 Unspecified chronic bronchitis: Secondary | ICD-10-CM | POA: Diagnosis present

## 2020-10-02 DIAGNOSIS — F419 Anxiety disorder, unspecified: Secondary | ICD-10-CM | POA: Diagnosis present

## 2020-10-02 DIAGNOSIS — L98429 Non-pressure chronic ulcer of back with unspecified severity: Secondary | ICD-10-CM | POA: Diagnosis present

## 2020-10-02 DIAGNOSIS — J90 Pleural effusion, not elsewhere classified: Secondary | ICD-10-CM | POA: Diagnosis present

## 2020-10-02 LAB — CBC WITH DIFFERENTIAL/PLATELET
Abs Immature Granulocytes: 0.11 10*3/uL — ABNORMAL HIGH (ref 0.00–0.07)
Basophils Absolute: 0.1 10*3/uL (ref 0.0–0.1)
Basophils Relative: 1 %
Eosinophils Absolute: 0.1 10*3/uL (ref 0.0–0.5)
Eosinophils Relative: 1 %
HCT: 38.3 % — ABNORMAL LOW (ref 39.0–52.0)
Hemoglobin: 12 g/dL — ABNORMAL LOW (ref 13.0–17.0)
Immature Granulocytes: 1 %
Lymphocytes Relative: 23 %
Lymphs Abs: 2.3 10*3/uL (ref 0.7–4.0)
MCH: 25.5 pg — ABNORMAL LOW (ref 26.0–34.0)
MCHC: 31.3 g/dL (ref 30.0–36.0)
MCV: 81.3 fL (ref 80.0–100.0)
Monocytes Absolute: 1.1 10*3/uL — ABNORMAL HIGH (ref 0.1–1.0)
Monocytes Relative: 12 %
Neutro Abs: 6.2 10*3/uL (ref 1.7–7.7)
Neutrophils Relative %: 62 %
Platelets: 309 10*3/uL (ref 150–400)
RBC: 4.71 MIL/uL (ref 4.22–5.81)
RDW: 17.4 % — ABNORMAL HIGH (ref 11.5–15.5)
WBC: 9.9 10*3/uL (ref 4.0–10.5)
nRBC: 0 % (ref 0.0–0.2)

## 2020-10-02 LAB — COMPREHENSIVE METABOLIC PANEL
ALT: 13 U/L (ref 0–44)
AST: 14 U/L — ABNORMAL LOW (ref 15–41)
Albumin: 2.8 g/dL — ABNORMAL LOW (ref 3.5–5.0)
Alkaline Phosphatase: 108 U/L (ref 38–126)
Anion gap: 6 (ref 5–15)
BUN: 11 mg/dL (ref 6–20)
CO2: 28 mmol/L (ref 22–32)
Calcium: 9.1 mg/dL (ref 8.9–10.3)
Chloride: 103 mmol/L (ref 98–111)
Creatinine, Ser: 0.48 mg/dL — ABNORMAL LOW (ref 0.61–1.24)
GFR, Estimated: >60 mL/min (ref >60)
Glucose, Bld: 95 mg/dL (ref 70–99)
Potassium: 4.1 mmol/L (ref 3.5–5.1)
Sodium: 137 mmol/L (ref 135–145)
Total Bilirubin: 0.4 mg/dL (ref 0.3–1.2)
Total Protein: 6.6 g/dL (ref 6.5–8.1)

## 2020-10-02 LAB — TROPONIN I (HIGH SENSITIVITY)
Troponin I (High Sensitivity): 5 ng/L (ref <18)
Troponin I (High Sensitivity): 5 ng/L (ref <18)

## 2020-10-02 LAB — LACTIC ACID, PLASMA
Lactic Acid, Venous: 2.1 mmol/L (ref 0.5–1.9)
Lactic Acid, Venous: 1.4 mmol/L (ref 0.5–1.9)

## 2020-10-02 MED ORDER — LACTATED RINGERS IV BOLUS
1000.0000 mL | Freq: Once | INTRAVENOUS | Status: AC
  Administered 2020-10-02: 1000 mL via INTRAVENOUS

## 2020-10-02 MED ORDER — SODIUM CHLORIDE 0.9 % IV SOLN
3.0000 g | Freq: Once | INTRAVENOUS | Status: AC
  Administered 2020-10-02: 3 g via INTRAVENOUS
  Filled 2020-10-02: qty 8

## 2020-10-02 NOTE — ED Notes (Signed)
[address] [place], would like to be kept update. [phone].

## 2020-10-02 NOTE — ED Notes (Signed)
Pt stated verbal acceptance of MSE waiver. This RN signed on pts behalf.

## 2020-10-02 NOTE — ED Provider Notes (Signed)
[facility] EMERGENCY [facility] Provider Note   CSN: [id number] Arrival date & time: 10/02/20  1642     History Chief Complaint  Patient presents with  . Chest Pain    [name] is a 60 y.o. male past medical history of cerebral palsy, quadriplegia, mental retardation, dysphagia, presenting for evaluation of chest pain and cough that began today.  Patient has high risk of aspiration due to dysphagia.  Has been admitted frequently over the last couple of months for recurrent pneumonia.  Last admission was in April where he required thoracentesis for loculated right pleural effusion, was treated for aspiration PNA.  Patient's caregiver is at bedside and reports he had similar complaints prior to his recent admissions where he was found to have this recurrent aspiration pneumonia.  She states they have been pureing his food to help prevent aspiration.  Patient states his symptoms began today, come and go.  He also has been coughing.  Does not have abdominal pain today.   The history is provided by the patient and a caregiver.       Past Medical History:  Diagnosis Date  . Anemia    microcytic anemia   . Anxiety    takes Ativan daily as needed  . Bipolar disorder (HCC)    takes Depakote daily  . CEREBRAL PALSY 07/24/2006  . Chronic bronchitis (HCC)    "gets it q yr"  . Chronic bronchitis (HCC)   . 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)   . Constipation    takes Miralax daily as needed  . Depression    takes Risperdal nightly  . Environmental allergies    takes Zyrtec daily  . GASTRIC ULCER ACUTE WITH HEMORRHAGE 07/24/2006  . Gastric ulcer with hemorrhage    hx of   . GERD (gastroesophageal reflux disease)    takes Pravacid daily  . History of blood transfusion    no abnormal reaction noted  . History of hiatal hernia   . History of shingles   . History of stomach ulcers ~ 1996  . Hypercholesterolemia   . Intellectual  functioning disability   . Intermittent explosive disorder   . Internal hemorrhoids   . MRSA infection 09/06/2019  . Obsessive compulsive disorder   . Osteomyelitis (HCC)    right foot   . Pneumonia "quite a few times"   in 2015 and in June 2016  . Pressure ulcer of sacral region    hx of   . Pseudomonas aeruginosa infection 09/06/2019  . Psychotic disorder (HCC)   . Recurrent UTI (urinary tract infection)   . Seborrheic dermatitis   . Sigmoid diverticulosis   . Spastic tetraplegia (HCC)   . URINARY INCONTINENCE 02/09/2010   takes Detrol daily-occasionally incontinence  . Vitamin D deficiency   . Weakness    numbness in extremities    Patient Active Problem List   Diagnosis Date Noted  . Recurrent right pleural effusion 10/02/2020  . Abnormal CT scan, colon   . 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 Nausea and vomiting 07/31/2020  . 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 Osteomyelitis (HCC) 10/08/2019  . Acute osteomyelitis of right ankle or foot (HCC)   . Ulcerated, foot, right, with necrosis of bone (HCC)   . Pseudomonas aeruginosa infection 09/06/2019  . MRSA infection 09/06/2019  . Lactic acidosis   . Pressure injury of skin 08/10/2018  . Gastric wall thickening 08/10/2018  .  Alkaline phosphatase elevation 08/10/2018  . Cholelithiasis 08/10/2018  . Microcytic anemia   . Hiatal hernia 04/07/2018  . 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 Quadriplegia (HCC) 12/15/2015  . Cerebral palsy, quadriplegic (HCC)   . Pre-diabetes 08/31/2015  . Foot ulcer, limited to breakdown of skin (HCC) 06/08/2015  . Seasonal allergies 11/01/2014  . Abdominal pain   . Pressure ulcer of foot 03/14/2014  . CAP (community acquired pneumonia) 03/13/2014  . Pressure ulcer 01/08/2013  . URINARY INCONTINENCE 02/09/2010  . Mental retardation 07/24/2006  . Infantile cerebral palsy (HCC) 07/24/2006    Past Surgical History:  Procedure  Laterality Date  . AMPUTATION Right 12/14/2014   Procedure: AMPUTATION DIGIT RIGHT GREAT TOE MTP;  Surgeon: Nadara Mustard, MD;  Location: [facility] OR;  Service: Orthopedics;  Laterality: Right;  . AMPUTATION Right 10/08/2019   Procedure: RIGHT FIFTH RAY AMPUTATION, ADJACENT TISSUE TRANSFER;  Surgeon: Park Liter, DPM;  Location: [facility]S;  Service: Podiatry;  Laterality: Right;  . AMPUTATION Right 07/27/2020   Procedure: AMPUTATION BELOW KNEE;  Surgeon: Toni Arthurs, MD;  Location: [facility] OR;  Service: Orthopedics;  Laterality: Right;   . BIOPSY  08/12/2018   Procedure: BIOPSY;  Surgeon: Lynann Bologna, MD;  Location: [facility];  Service: Gastroenterology;;  . COLONOSCOPY WITH PROPOFOL N/A 08/14/2018   Procedure: COLONOSCOPY WITH PROPOFOL;  Surgeon: Lynann Bologna, MD;  Location: [facility];  Service: Gastroenterology;  Laterality: N/A;  . ESOPHAGOGASTRODUODENOSCOPY (EGD) WITH PROPOFOL N/A 08/12/2018   Procedure: ESOPHAGOGASTRODUODENOSCOPY (EGD) WITH PROPOFOL;  Surgeon: Lynann Bologna, MD;  Location: [facility];  Service: Gastroenterology;  Laterality: N/A;  . GRAFT APPLICATION Right 11/12/2019   Procedure: SKIN GRAFT SUBSTITUTE APPLICATION;  Surgeon: Park Liter, DPM;  Location: [facility];  Service: Podiatry;  Laterality: Right;  . HERNIA REPAIR    . HIATAL HERNIA REPAIR  1982  . LEG SURGERY Bilateral ~ 1967   "legs were scissored; stretched them out"  . WOUND DEBRIDEMENT Right 11/12/2019   Procedure: DEBRIDEMENT WOUND;  Surgeon: Park Liter, DPM;  Location: [facility];  Service: Podiatry;  Laterality: Right;       Family History  Adopted: Yes    Social History   Tobacco Use  . Smoking status: Never Smoker  . Smokeless tobacco: Never Used  Substance Use Topics  . Alcohol use: No  . Drug use: No    Home Medications Prior to Admission medications   Medication Sig Start Date End Date Taking? Authorizing Provider  acetaminophen (TYLENOL) 500 MG tablet Take 500 mg by mouth every 6  (six) hours as needed (for aches, pains, and/or fevers).   Yes [provider]  albuterol (VENTOLIN HFA) 108 (90 Base) MCG/ACT inhaler Inhale 2 puffs into the lungs every 4 (four) hours as needed for shortness of breath or wheezing. 01/11/20  Yes [provider]  alum & mag hydroxide-simeth (MAALOX/MYLANTA) 200-200-20 MG/5ML suspension Take 15-30 mLs by mouth every 6 (six) hours as needed for indigestion or heartburn (or nausea).   Yes [provider]  baclofen (LIORESAL) 20 MG tablet Take 1 tablet (20 mg total) by mouth 3 (three) times daily. 07/18/16  Yes Kirsteins, Victorino Sparrow, MD  bisacodyl (DULCOLAX) 10 MG suppository Place 1 suppository (10 mg total) rectally daily as needed for moderate constipation. Patient taking differently: Place 10 mg rectally every other day. Place 10 mg rectally every other day and daily as needed for moderate constipation 08/03/20  Yes Sheikh, [place],  DO  brompheniramine-pseudoephedrine (DIMETAPP) 1-15 MG/5ML ELIX Take 10 mLs by mouth every 8 (eight) hours as needed (for cold symptoms).   Yes [provider]  cetirizine (ZYRTEC) 10 MG tablet Take 1 tablet (10 mg total) by mouth daily. Patient taking differently: Take 10 mg by mouth at bedtime. 11/01/14  Yes Street, Stephanie Coup, MD  Cholecalciferol (VITAMIN D3) 5000 units CAPS Take 5,000 Units by mouth daily.   Yes [provider]  clotrimazole (LOTRIMIN) 1 % cream Apply 1 application topically 2 (two) times daily.   Yes [provider]  divalproex (DEPAKOTE SPRINKLE) 125 MG capsule Take 500 mg by mouth 2 (two) times daily.    Yes [provider]  FERREX 150 150 MG capsule Take 150 mg by mouth daily at 12 noon.   Yes [provider]  fluticasone (FLONASE) 50 MCG/ACT nasal spray Place 1 spray into both nostrils daily. 12/21/19  Yes [provider]  Foot Care Products (PREVALON HEEL PROTECTOR) MISC 1 Device by Does not apply route daily. 06/20/15   Yes Marquette Saa, MD  guaiFENesin-dextromethorphan [facility] DM) 100-10 MG/5ML syrup Take 10 mLs by mouth every 4 (four) hours as needed for cough.    Yes [provider]  hydrocortisone cream 1 % APPLY TOPICALLY AT BEDTIME AS NEEDED FOR ITCHING Patient taking differently: Apply 1 application topically See admin instructions. Apply a thin layer to the face/neck rash 2 times a day AND at bedtime, as needed for itching 05/29/20  Yes Mullis, Kiersten P, DO  ketoconazole (NIZORAL) 2 % shampoo APPLY TOPICALLY TO AFFECTED AREA(S) TWICE WEEKLY AS DIRECTED Patient taking differently: Apply 1 application topically 2 (two) times a week. 12/22/19  Yes Mullis, Kiersten P, DO  Lido-PE-Glycerin-Petrolatum (PREPARATION H RAPID RELIEF) 5-0.25-14.4-15 % CREA Apply 1 application topically See admin instructions. Apply a thin layer topically to the affected area 2 times a day for residual hemorrhoidal skin tag   Yes [provider]  LORazepam (ATIVAN) 2 MG tablet Take 1 mg by mouth every 4 (four) hours as needed for anxiety.    Yes [provider]  Neomycin-Bacitracin-Polymyxin (TRIPLE ANTIBIOTIC EX) Apply 1 application topically daily as needed (cuts/scrapes/scratches).   Yes [provider]  omeprazole (PRILOSEC) 40 MG capsule Take 1 capsule (40 mg total) by mouth 2 (two) times daily. 08/15/18  Yes Rodolph Bong, MD  ondansetron (ZOFRAN) 4 MG tablet Take 1 tablet (4 mg total) by mouth every 6 (six) hours as needed for nausea. 08/03/20  Yes Sheikh, Omair Latif, DO  Oyster Shell (OYSTER CALCIUM) 500 MG TABS tablet Take 500 mg of elemental calcium by mouth daily.   Yes [provider]  polyethylene glycol (MIRALAX) 17 g packet Take 17 g by mouth daily as needed for moderate constipation. Patient taking differently: Take 17 g by mouth daily as needed for moderate constipation or mild constipation (MIX INTO 8 OUNCES OF BEVERAGE OF CHOICE). 10/09/19  Yes Kyle, Tyrone  A, DO  risperiDONE (RISPERDAL) 2 MG tablet Take 2 mg by mouth at bedtime.   Yes [provider]  tamsulosin (FLOMAX) 0.4 MG CAPS capsule TAKE 1 CAPSULE BY MOUTH EVERY DAY *DO NOT CRUSH* Patient taking differently: Take 0.4 mg by mouth daily. 06/19/20  Yes Mullis, Kiersten P, DO  tolterodine (DETROL LA) 2 MG 24 hr capsule Take 1 capsule (2 mg total) by mouth daily. 04/08/13  Yes Street, Stephanie Coup, MD  tretinoin (RETIN-A) 0.025 % gel Apply 1 application topically See admin instructions.  Apply to the affected areas of the face at bedtime   Yes [provider]  vitamin C (VITAMIN C) 250 MG tablet Take 1 tablet (250 mg total) by mouth daily. 01/18/16  Yes Palma Holter, MD  VOLTAREN 1 % GEL APPLY 4 GRAMS TOPICALLY TO AFFECTED AREA(S) FOUR TIMES DAILY Patient taking differently: Apply 4 g topically See admin instructions. Apply 4 grams to affected areas four times a day 06/16/19  Yes Mullis, Kiersten P, DO  Zinc Oxide 40 % PSTE Apply 1 application topically 2 (two) times daily as needed (unspecified). Desitin Max strength paste apply thin layer topically to buttocks and scrotum Patient taking differently: Apply 1 application topically 2 (two) times daily as needed (to scrotum and buttocks- as directed). 06/07/20  Yes McDiarmid, Leighton Roach, MD  iron polysaccharides (NIFEREX) 150 MG capsule Take 150 mg by mouth daily at 12 noon.     [provider]  mupirocin ointment (BACTROBAN) 2 % Apply 1 application topically daily as needed (wound care).    [provider]  saccharomyces boulardii (FLORASTOR) 250 MG capsule Take 1 capsule (250 mg total) by mouth 2 (two) times daily. Patient not taking: No sig reported 09/07/20   Lorin Glass, MD  senna-docusate (SENOKOT-S) 8.6-50 MG tablet Take 1 tablet by mouth at bedtime. Patient not taking: No sig reported 08/03/20   Marguerita Merles Latif, DO  triamcinolone cream (KENALOG) 0.1 % Apply 1 application topically 3 (three) times daily.  Apply cream to left ankle three time daily 08/21/20   [provider]    Allergies    Adhesive [tape]  Review of Systems   Review of Systems  Respiratory: Positive for cough.   Cardiovascular: Positive for chest pain.  All other systems reviewed and are negative.   Physical Exam Updated Vital Signs BP (!) 129/91   Pulse 71   Temp 98.2 F (36.8 C) (Oral)   Resp 12   SpO2 100%   Physical Exam Vitals and nursing note reviewed.  Constitutional:      Appearance: He is well-developed.  HENT:     Head: Normocephalic and atraumatic.  Eyes:     Conjunctiva/sclera: Conjunctivae normal.  Cardiovascular:     Rate and Rhythm: Normal rate and regular rhythm.  Pulmonary:     Effort: Pulmonary effort is normal. No respiratory distress.     Comments: Diminished b/l Abdominal:     General: Bowel sounds are normal.     Palpations: Abdomen is soft.     Tenderness: There is no abdominal tenderness.  Musculoskeletal:     Comments: Right BKA, left LE edema and contracture  Skin:    General: Skin is warm.  Neurological:     Mental Status: He is alert. Mental status is at baseline.  Psychiatric:        Behavior: Behavior normal.     ED Results / Procedures / Treatments   Labs (all labs ordered are listed, but only abnormal results are displayed) Labs Reviewed  CBC WITH DIFFERENTIAL/PLATELET - Abnormal; Notable for the following components:      Result Value   Hemoglobin 12.0 (*)    HCT 38.3 (*)    MCH 25.5 (*)    RDW 17.4 (*)    Monocytes Absolute 1.1 (*)    Abs Immature Granulocytes 0.11 (*)    All other components within normal limits  COMPREHENSIVE METABOLIC PANEL - Abnormal; Notable for the following components:   Creatinine, Ser 0.48 (*)    Albumin  2.8 (*)    AST 14 (*)    All other components within normal limits  LACTIC ACID, PLASMA - Abnormal; Notable for the following components:   Lactic Acid, Venous 2.1 (*)    All other components within normal limits   LACTIC ACID, PLASMA  TROPONIN I (HIGH SENSITIVITY)  TROPONIN I (HIGH SENSITIVITY)    EKG EKG Interpretation  Date/Time:  Monday Oct 02 2020 16:50:52 EDT Ventricular Rate:  74 PR Interval:  168 QRS Duration: 94 QT Interval:  479 QTC Calculation: 532 R Axis:   58 Text Interpretation: Sinus rhythm Abnormal R-wave progression, early transition Borderline T abnormalities, anterior leads Prolonged QT interval No acute changes No significant change since last tracing Confirmed by Derwood Kaplan ([id number]) on 10/02/2020 8:14:17 PM   Radiology DG Chest 1 View  Result Date: 10/02/2020 CLINICAL DATA:  Cough. EXAM: CHEST  1 VIEW COMPARISON:  September 06, 2020 FINDINGS: The study is limited secondary to patient rotation. Mild atelectasis and/or infiltrate is seen within the right lung base. The left lung is clear. There is no evidence of a pleural effusion or pneumothorax. Stable cardiomegaly is seen. Radiopaque surgical clips are seen overlying the expected region of the esophageal hiatus. Degenerative changes seen throughout the thoracic spine. IMPRESSION: Limited study, as described above, with mild right basilar atelectasis and/or infiltrate. Electronically Signed   By: Aram Candela M.D.   On: 10/02/2020 18:57    Procedures Ultrasound ED Thoracic  Date/Time: 10/02/2020 11:02 PM Performed by: Milana Salay, [place], PA-C Authorized by: Charlane Westry, [place] N, PA-C   Procedure details:    Indications: chest pain     Assessment for:  Pleural effusion   Left lung pleural:  Visualized   Right lung pleural:  Visualized   Images: archived   Right Lung Findings:     right lung sliding    right lung pleural effusion      Left Lung Findings:     left lung sliding    left lung pleural effusion Impression:    Impression: fluid in thorax       Medications Ordered in ED Medications  lactated ringers bolus 1,000 mL (0 mLs Intravenous Stopped 10/02/20 2254)  Ampicillin-Sulbactam (UNASYN) 3 g in sodium  chloride 0.9 % 100 mL IVPB (0 g Intravenous Stopped 10/02/20 2205)    ED Course  I have reviewed the triage vital signs and the nursing notes.  Pertinent labs & imaging results that were available during my care of the patient were reviewed by me and considered in my medical decision making (see chart for details).  Clinical Course as of 10/03/20 0027  Mon Oct 02, 2020  2253 Bedside US performed. Fluid is present in the chest, R>L [JR]    Clinical Course User Index [JR] Doyce Stonehouse, [place] N, PA-C   MDM Rules/Calculators/A&P                          Patient has history of cerebral palsy, quadriplegia, recurrent aspiration pneumonia presenting for chest pain and cough.  Has high risk of aspiration due to dysphagia.  Was admitted last month with right pleural effusion required thoracentesis.  Presentation is similar today.  Chest x-ray is limited due to positioning, however was independently reviewed and interpreted by myself.  Appears similar to prior.  Bedside ultrasound was done with attending physician Dr. Rhunette Croft.  Appears to have fluid, right greater than left.  Had a small lactic acidosis 2.1, is improving on  recheck.  He is not febrile here and does not have leukocytosis, it appears he did not have either of those things during last admission as well.  However with recurrence of symptoms, pleural effusions on ultrasound, believe patient would benefit from admission.  IV Unasyn given to cover aspiration pneumonia though question another cause of recurring symptoms.    Final Clinical Impression(s) / ED Diagnoses Final diagnoses:  Recurrent pleural effusion    Rx / DC Orders ED Discharge Orders    None       Braedin Millhouse, [place] N, PA-C 10/03/20 2263    Derwood Kaplan, MD 10/03/20 [phone]

## 2020-10-02 NOTE — ED Triage Notes (Signed)
Pt bib EMS from [facility] UCP for complaints of chest pain that started upon awakening this morning. Pain radiating to ribs, shoulders, and sides. Pt rating 10/10 on triage. Pain worsens when coughing and taking deep breaths. Facility has concerns for pneumonia due to coughing.  EKG WDL  Vitals: BP: 122/70 HR: 72 RR: 16 O2: 98%

## 2020-10-02 NOTE — ED Notes (Signed)
Pt transported to XRay 

## 2020-10-03 ENCOUNTER — Encounter (HOSPITAL_COMMUNITY): Payer: Self-pay | Admitting: Internal Medicine

## 2020-10-03 DIAGNOSIS — F429 Obsessive-compulsive disorder, unspecified: Secondary | ICD-10-CM | POA: Diagnosis present

## 2020-10-03 DIAGNOSIS — R079 Chest pain, unspecified: Secondary | ICD-10-CM | POA: Diagnosis present

## 2020-10-03 DIAGNOSIS — J90 Pleural effusion, not elsewhere classified: Secondary | ICD-10-CM

## 2020-10-03 DIAGNOSIS — F319 Bipolar disorder, unspecified: Secondary | ICD-10-CM | POA: Diagnosis present

## 2020-10-03 DIAGNOSIS — K219 Gastro-esophageal reflux disease without esophagitis: Secondary | ICD-10-CM | POA: Diagnosis present

## 2020-10-03 DIAGNOSIS — G809 Cerebral palsy, unspecified: Secondary | ICD-10-CM | POA: Diagnosis present

## 2020-10-03 DIAGNOSIS — R1310 Dysphagia, unspecified: Secondary | ICD-10-CM | POA: Diagnosis not present

## 2020-10-03 DIAGNOSIS — R918 Other nonspecific abnormal finding of lung field: Secondary | ICD-10-CM

## 2020-10-03 DIAGNOSIS — E7800 Pure hypercholesterolemia, unspecified: Secondary | ICD-10-CM | POA: Diagnosis present

## 2020-10-03 DIAGNOSIS — F419 Anxiety disorder, unspecified: Secondary | ICD-10-CM | POA: Diagnosis present

## 2020-10-03 DIAGNOSIS — Z8701 Personal history of pneumonia (recurrent): Secondary | ICD-10-CM | POA: Diagnosis not present

## 2020-10-03 DIAGNOSIS — L89153 Pressure ulcer of sacral region, stage 3: Secondary | ICD-10-CM | POA: Diagnosis present

## 2020-10-03 DIAGNOSIS — J42 Unspecified chronic bronchitis: Secondary | ICD-10-CM | POA: Diagnosis present

## 2020-10-03 DIAGNOSIS — R0902 Hypoxemia: Secondary | ICD-10-CM | POA: Diagnosis present

## 2020-10-03 DIAGNOSIS — Z79899 Other long term (current) drug therapy: Secondary | ICD-10-CM | POA: Diagnosis not present

## 2020-10-03 DIAGNOSIS — Z7401 Bed confinement status: Secondary | ICD-10-CM | POA: Diagnosis not present

## 2020-10-03 DIAGNOSIS — F79 Unspecified intellectual disabilities: Secondary | ICD-10-CM | POA: Diagnosis present

## 2020-10-03 DIAGNOSIS — Z89511 Acquired absence of right leg below knee: Secondary | ICD-10-CM | POA: Diagnosis not present

## 2020-10-03 DIAGNOSIS — Z888 Allergy status to other drugs, medicaments and biological substances status: Secondary | ICD-10-CM | POA: Diagnosis not present

## 2020-10-03 DIAGNOSIS — J690 Pneumonitis due to inhalation of food and vomit: Principal | ICD-10-CM

## 2020-10-03 DIAGNOSIS — Z20822 Contact with and (suspected) exposure to covid-19: Secondary | ICD-10-CM | POA: Diagnosis present

## 2020-10-03 DIAGNOSIS — Z66 Do not resuscitate: Secondary | ICD-10-CM | POA: Diagnosis present

## 2020-10-03 DIAGNOSIS — L89223 Pressure ulcer of left hip, stage 3: Secondary | ICD-10-CM | POA: Diagnosis not present

## 2020-10-03 DIAGNOSIS — R532 Functional quadriplegia: Secondary | ICD-10-CM | POA: Diagnosis not present

## 2020-10-03 DIAGNOSIS — L98429 Non-pressure chronic ulcer of back with unspecified severity: Secondary | ICD-10-CM

## 2020-10-03 LAB — CBC WITH DIFFERENTIAL/PLATELET
Abs Immature Granulocytes: 0.09 10*3/uL — ABNORMAL HIGH (ref 0.00–0.07)
Basophils Absolute: 0.1 10*3/uL (ref 0.0–0.1)
Basophils Relative: 1 %
Eosinophils Absolute: 0.1 10*3/uL (ref 0.0–0.5)
Eosinophils Relative: 1 %
HCT: 34.9 % — ABNORMAL LOW (ref 39.0–52.0)
Hemoglobin: 11.1 g/dL — ABNORMAL LOW (ref 13.0–17.0)
Immature Granulocytes: 1 %
Lymphocytes Relative: 26 %
Lymphs Abs: 2.1 10*3/uL (ref 0.7–4.0)
MCH: 25.6 pg — ABNORMAL LOW (ref 26.0–34.0)
MCHC: 31.8 g/dL (ref 30.0–36.0)
MCV: 80.6 fL (ref 80.0–100.0)
Monocytes Absolute: 1.1 10*3/uL — ABNORMAL HIGH (ref 0.1–1.0)
Monocytes Relative: 14 %
Neutro Abs: 4.6 10*3/uL (ref 1.7–7.7)
Neutrophils Relative %: 57 %
Platelets: 288 10*3/uL (ref 150–400)
RBC: 4.33 MIL/uL (ref 4.22–5.81)
RDW: 17.4 % — ABNORMAL HIGH (ref 11.5–15.5)
WBC: 8.1 10*3/uL (ref 4.0–10.5)
nRBC: 0 % (ref 0.0–0.2)

## 2020-10-03 LAB — C-REACTIVE PROTEIN: CRP: 0.5 mg/dL (ref <1.0)

## 2020-10-03 LAB — COMPREHENSIVE METABOLIC PANEL
ALT: 12 U/L (ref 0–44)
AST: 15 U/L (ref 15–41)
Albumin: 2.5 g/dL — ABNORMAL LOW (ref 3.5–5.0)
Alkaline Phosphatase: 98 U/L (ref 38–126)
Anion gap: 7 (ref 5–15)
BUN: 9 mg/dL (ref 6–20)
CO2: 26 mmol/L (ref 22–32)
Calcium: 8.9 mg/dL (ref 8.9–10.3)
Chloride: 106 mmol/L (ref 98–111)
Creatinine, Ser: 0.43 mg/dL — ABNORMAL LOW (ref 0.61–1.24)
GFR, Estimated: >60 mL/min (ref >60)
Glucose, Bld: 85 mg/dL (ref 70–99)
Potassium: 3.5 mmol/L (ref 3.5–5.1)
Sodium: 139 mmol/L (ref 135–145)
Total Bilirubin: 0.3 mg/dL (ref 0.3–1.2)
Total Protein: 5.8 g/dL — ABNORMAL LOW (ref 6.5–8.1)

## 2020-10-03 LAB — SARS CORONAVIRUS 2 (TAT 6-24 HRS): SARS Coronavirus 2: NEGATIVE

## 2020-10-03 LAB — PROTIME-INR
INR: 1 (ref 0.8–1.2)
Prothrombin Time: 13.5 seconds (ref 11.4–15.2)

## 2020-10-03 LAB — APTT: aPTT: 30 seconds (ref 24–36)

## 2020-10-03 LAB — PROCALCITONIN: Procalcitonin: <0.1 ng/mL

## 2020-10-03 MED ORDER — BISACODYL 10 MG RE SUPP
10.0000 mg | RECTAL | Status: DC
  Administered 2020-10-03: 10 mg via RECTAL
  Filled 2020-10-03: qty 1

## 2020-10-03 MED ORDER — SODIUM CHLORIDE 0.9 % IV SOLN
3.0000 g | Freq: Four times a day (QID) | INTRAVENOUS | Status: DC
  Administered 2020-10-03 – 2020-10-06 (×12): 3 g via INTRAVENOUS
  Filled 2020-10-03 (×6): qty 8
  Filled 2020-10-03: qty 3
  Filled 2020-10-03 (×4): qty 8
  Filled 2020-10-03: qty 3
  Filled 2020-10-03: qty 8
  Filled 2020-10-03: qty 3
  Filled 2020-10-03: qty 8

## 2020-10-03 MED ORDER — ALBUTEROL SULFATE HFA 108 (90 BASE) MCG/ACT IN AERS: 2.0000 | INHALATION_SPRAY | RESPIRATORY_TRACT | Status: DC | PRN

## 2020-10-03 MED ORDER — TAMSULOSIN HCL 0.4 MG PO CAPS: 0.4000 mg | ORAL_CAPSULE | Freq: Every day | ORAL | Status: DC

## 2020-10-03 MED ORDER — ENOXAPARIN SODIUM 40 MG/0.4ML IJ SOSY
40.0000 mg | PREFILLED_SYRINGE | Freq: Every day | INTRAMUSCULAR | Status: DC
  Administered 2020-10-03 – 2020-10-06 (×4): 40 mg via SUBCUTANEOUS
  Filled 2020-10-03 (×4): qty 0.4

## 2020-10-03 MED ORDER — LACTATED RINGERS IV SOLN: INTRAVENOUS | Status: AC

## 2020-10-03 MED ORDER — FLUTICASONE PROPIONATE 50 MCG/ACT NA SUSP
1.0000 | Freq: Every day | NASAL | Status: DC
  Administered 2020-10-03 – 2020-10-06 (×4): 1 via NASAL
  Filled 2020-10-03 (×2): qty 16

## 2020-10-03 MED ORDER — PANTOPRAZOLE SODIUM 40 MG PO TBEC
40.0000 mg | DELAYED_RELEASE_TABLET | Freq: Two times a day (BID) | ORAL | Status: DC
  Administered 2020-10-03 – 2020-10-06 (×7): 40 mg via ORAL
  Filled 2020-10-03 (×7): qty 1

## 2020-10-03 MED ORDER — ACETAMINOPHEN 650 MG RE SUPP
650.0000 mg | RECTAL | Status: DC | PRN
  Administered 2020-10-03 – 2020-10-05 (×3): 650 mg via RECTAL
  Filled 2020-10-03 (×3): qty 1

## 2020-10-03 MED ORDER — VALPROATE SODIUM 100 MG/ML IV SOLN: 250.0000 mg | Freq: Four times a day (QID) | INTRAVENOUS | Status: DC

## 2020-10-03 MED ORDER — FOOD THICKENER (SIMPLYTHICK)
15.0000 | ORAL | Status: DC | PRN
  Filled 2020-10-03: qty 15

## 2020-10-03 NOTE — Progress Notes (Addendum)
Same day note  Patient seen and examined at bedside.  Patient was admitted to the hospital for chest pain  At the time of my evaluation, patient complains of mild cough.  Denies any chest pain at the time of my evaluation.  Limited history due to patient's cerebral palsy.  Patient's sister at bedside.  Physical examination reveals male lying on the left lateral position, diminished breath sounds in the right basal area, sacral, left hip and left heel Decubitus ulceration.  Laboratory data and imaging was reviewed  Assessment and Plan.  Recurrent right pleural effusion with lung infiltrates.  Possible aspiration at the group home. Resolving effusion compared to prior xray. No indication for thoracocentesis at this time. On dysphagia 3 diet at home.   Speech therapy recommends dysphagia 1 diet.  Continue supplemental oxygen.  Patient received 1 dose of Unasyn in the ED. Will continue with Unasyn to cover for possible aspiration pneumonia.    Dysphagia On dysphagia 3 diet.  Seen by speech therapy and recommended dysphagia 1 diet.  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 Continue wound care.    Functional quadriplegia  Patient is a bedbound at baseline.  Continue supportive care.    GERD without esophagitis Continue Protonix  Spoke with the patient's sister at bedside.  No Charge  Signed,  Tenny Craw, MD [facility]

## 2020-10-03 NOTE — H&P (Signed)
History and Physical    [name] ZOX:[id number] DOB: [birth year] DOA: 10/02/2020  PCP: Harvest Forest, MD  Patient coming from: [facility]   Chief Complaint:  Chief Complaint  Patient presents with  . 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  Of note, patient was admitted to [facility] from 3/7 until 3/10 for diffuse right-sided infiltrates thought to be secondary to right lung pneumonia.  Patient was treated with a course of intravenous antibiotics and eventually discharged.  Patient then presented once again to [facility] from 4/11 till 4/14 with diffuse right-sided infiltrates once again concerning for aspiration pneumonia with concurrent substantial pleural effusion.  Patient was initiated on intravenous antibiotics and a thoracentesis was performed on 4/13.  Cytology simply revealed reactive mesothelial cells and lymphocytes.  Light's criteria were suggestive of an exudative effusion.  Patient was eventually transitioned to oral antibiotics with Augmentin and discharged on 4/14.  During that hospitalization due to concerns for aspiration speech therapy evaluated the patient and recommended dysphagia 3 diet.  Patient explains that he was eating breakfast the morning of 5/9 at his group home.  As the patient was eating his oatmeal he suddenly began choking on his food and began to experience severe cough.  Patient additionally states that he was short of breath and began to breathe so heavily that he was experiencing diffuse chest pain that he describes as a "soreness."  He states that this  pain was severe and radiating to his bilateral flanks.  Patient denies fevers, nausea, vomiting.  Patient symptoms continue to persist throughout the day until the patient was eventually brought into [facility] emergency department for evaluation.  Upon evaluation in the emergency department, repeat chest x-ray once again demonstrates substantial multifocal infiltrates in the right lung fields.  Patient was initiated on intravenous antibiotics with intravenous Unasyn.  Due to patient's ongoing symptoms of chest discomfort and cough the ER provider was concerned for recurrent aspiration pneumonia, the hospitalist group was called to assess the patient for admission to the hospital.  Review of Systems:   Review of Systems  Respiratory: Positive for cough and shortness of breath.   Cardiovascular: Positive for chest pain.  All other systems reviewed and are negative.   Past Medical History:  Diagnosis Date  . Anemia    microcytic anemia   . Anxiety    takes Ativan daily as needed  . Bipolar disorder (HCC)    takes Depakote daily  . CEREBRAL PALSY 07/24/2006  . Chronic bronchitis (HCC)    "gets it q yr"  . Chronic bronchitis (HCC)   . 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)   . Constipation    takes Miralax daily as needed  . Depression    takes Risperdal nightly  . Environmental allergies    takes Zyrtec daily  . GASTRIC ULCER ACUTE WITH HEMORRHAGE 07/24/2006  . Gastric ulcer with hemorrhage    hx of   . GERD (gastroesophageal reflux disease)    takes Pravacid daily  . History of blood transfusion    no abnormal reaction noted  . History of hiatal hernia   .  History of shingles   . History of stomach ulcers ~ 1996  . Hypercholesterolemia   . Intellectual functioning disability   . Intermittent explosive disorder   . Internal hemorrhoids   . MRSA infection 09/06/2019  . Obsessive compulsive disorder   . Osteomyelitis (HCC)    right foot   .  Pneumonia "quite a few times"   in 2015 and in June 2016  . Pressure ulcer of sacral region    hx of   . Pseudomonas aeruginosa infection 09/06/2019  . Psychotic disorder (HCC)   . Recurrent UTI (urinary tract infection)   . Seborrheic dermatitis   . Sigmoid diverticulosis   . Spastic tetraplegia (HCC)   . URINARY INCONTINENCE 02/09/2010   takes Detrol daily-occasionally incontinence  . Vitamin D deficiency   . Weakness    numbness in extremities    Past Surgical History:  Procedure Laterality Date  . AMPUTATION Right 12/14/2014   Procedure: AMPUTATION DIGIT RIGHT GREAT TOE MTP;  Surgeon: Nadara Mustard, MD;  Location: [facility] OR;  Service: Orthopedics;  Laterality: Right;  . AMPUTATION Right 10/08/2019   Procedure: RIGHT FIFTH RAY AMPUTATION, ADJACENT TISSUE TRANSFER;  Surgeon: Park Liter, DPM;  Location: [facility]S;  Service: Podiatry;  Laterality: Right;  . AMPUTATION Right 07/27/2020   Procedure: AMPUTATION BELOW KNEE;  Surgeon: Toni Arthurs, MD;  Location: [facility] OR;  Service: Orthopedics;  Laterality: Right;   . BIOPSY  08/12/2018   Procedure: BIOPSY;  Surgeon: Lynann Bologna, MD;  Location: [facility];  Service: Gastroenterology;;  . COLONOSCOPY WITH PROPOFOL N/A 08/14/2018   Procedure: COLONOSCOPY WITH PROPOFOL;  Surgeon: Lynann Bologna, MD;  Location: [facility];  Service: Gastroenterology;  Laterality: N/A;  . ESOPHAGOGASTRODUODENOSCOPY (EGD) WITH PROPOFOL N/A 08/12/2018   Procedure: ESOPHAGOGASTRODUODENOSCOPY (EGD) WITH PROPOFOL;  Surgeon: Lynann Bologna, MD;  Location: [facility];  Service: Gastroenterology;  Laterality: N/A;  . GRAFT APPLICATION Right 11/12/2019   Procedure: SKIN GRAFT SUBSTITUTE APPLICATION;  Surgeon: Park Liter, DPM;  Location: [facility];  Service: Podiatry;  Laterality: Right;  . HERNIA REPAIR    . HIATAL HERNIA REPAIR  1982  . LEG SURGERY Bilateral ~ 1967   "legs were scissored; stretched them out"  . WOUND DEBRIDEMENT Right 11/12/2019   Procedure:  DEBRIDEMENT WOUND;  Surgeon: Park Liter, DPM;  Location: [facility];  Service: Podiatry;  Laterality: Right;     reports that he has never smoked. He has never used smokeless tobacco. He reports that he does not drink alcohol and does not use drugs.  Allergies  Allergen Reactions  . Adhesive [Tape] Rash    Family History  Adopted: Yes     Prior to Admission medications   Medication Sig Start Date End Date Taking? Authorizing Provider  acetaminophen (TYLENOL) 500 MG tablet Take 500 mg by mouth every 6 (six) hours as needed (for aches, pains, and/or fevers).   Yes [provider]  albuterol (VENTOLIN HFA) 108 (90 Base) MCG/ACT inhaler Inhale 2 puffs into the lungs every 4 (four) hours as needed for shortness of breath or wheezing. 01/11/20  Yes [provider]  alum & mag hydroxide-simeth (MAALOX/MYLANTA) 200-200-20 MG/5ML suspension Take 15-30 mLs by mouth every 6 (six) hours as needed for indigestion or heartburn (or nausea).   Yes [provider]  baclofen (LIORESAL) 20 MG tablet Take 1 tablet (20 mg total) by mouth 3 (three) times daily. 07/18/16  Yes Kirsteins, Victorino Sparrow, MD  bisacodyl (DULCOLAX) 10 MG suppository Place 1  suppository (10 mg total) rectally daily as needed for moderate constipation. Patient taking differently: Place 10 mg rectally every other day. Place 10 mg rectally every other day and daily as needed for moderate constipation 08/03/20  Yes Sheikh, Omair Latif, DO  brompheniramine-pseudoephedrine (DIMETAPP) 1-15 MG/5ML ELIX Take 10 mLs by mouth every 8 (eight) hours as needed (for cold symptoms).   Yes [provider]  cetirizine (ZYRTEC) 10 MG tablet Take 1 tablet (10 mg total) by mouth daily. Patient taking differently: Take 10 mg by mouth at bedtime. 11/01/14  Yes Street, Stephanie Coup, MD  Cholecalciferol (VITAMIN D3) 5000 units CAPS Take 5,000 Units by mouth daily.   Yes [provider]  clotrimazole (LOTRIMIN) 1 % cream  Apply 1 application topically 2 (two) times daily.   Yes [provider]  divalproex (DEPAKOTE SPRINKLE) 125 MG capsule Take 500 mg by mouth 2 (two) times daily.    Yes [provider]  FERREX 150 150 MG capsule Take 150 mg by mouth daily at 12 noon.   Yes [provider]  fluticasone (FLONASE) 50 MCG/ACT nasal spray Place 1 spray into both nostrils daily. 12/21/19  Yes [provider]  Foot Care Products (PREVALON HEEL PROTECTOR) MISC 1 Device by Does not apply route daily. 06/20/15  Yes Marquette Saa, MD  guaiFENesin-dextromethorphan [facility] DM) 100-10 MG/5ML syrup Take 10 mLs by mouth every 4 (four) hours as needed for cough.    Yes [provider]  hydrocortisone cream 1 % APPLY TOPICALLY AT BEDTIME AS NEEDED FOR ITCHING Patient taking differently: Apply 1 application topically See admin instructions. Apply a thin layer to the face/neck rash 2 times a day AND at bedtime, as needed for itching 05/29/20  Yes Mullis, Kiersten P, DO  ketoconazole (NIZORAL) 2 % shampoo APPLY TOPICALLY TO AFFECTED AREA(S) TWICE WEEKLY AS DIRECTED Patient taking differently: Apply 1 application topically 2 (two) times a week. 12/22/19  Yes Mullis, Kiersten P, DO  Lido-PE-Glycerin-Petrolatum (PREPARATION H RAPID RELIEF) 5-0.25-14.4-15 % CREA Apply 1 application topically See admin instructions. Apply a thin layer topically to the affected area 2 times a day for residual hemorrhoidal skin tag   Yes [provider]  LORazepam (ATIVAN) 2 MG tablet Take 1 mg by mouth every 4 (four) hours as needed for anxiety.    Yes [provider]  Neomycin-Bacitracin-Polymyxin (TRIPLE ANTIBIOTIC EX) Apply 1 application topically daily as needed (cuts/scrapes/scratches).   Yes [provider]  omeprazole (PRILOSEC) 40 MG capsule Take 1 capsule (40 mg total) by mouth 2 (two) times daily. 08/15/18  Yes Rodolph Bong, MD  ondansetron (ZOFRAN) 4 MG tablet Take  1 tablet (4 mg total) by mouth every 6 (six) hours as needed for nausea. 08/03/20  Yes Sheikh, Omair Latif, DO  Oyster Shell (OYSTER CALCIUM) 500 MG TABS tablet Take 500 mg of elemental calcium by mouth daily.   Yes [provider]  polyethylene glycol (MIRALAX) 17 g packet Take 17 g by mouth daily as needed for moderate constipation. Patient taking differently: Take 17 g by mouth daily as needed for moderate constipation or mild constipation (MIX INTO 8 OUNCES OF BEVERAGE OF CHOICE). 10/09/19  Yes Kyle, Tyrone A, DO  risperiDONE (RISPERDAL) 2 MG tablet Take 2 mg by mouth at bedtime.   Yes [provider]  tamsulosin (FLOMAX) 0.4 MG CAPS capsule TAKE 1 CAPSULE BY MOUTH EVERY DAY *DO NOT CRUSH* Patient taking differently: Take 0.4 mg by mouth daily. 06/19/20  Yes Mullis,  Kiersten P, DO  tolterodine (DETROL LA) 2 MG 24 hr capsule Take 1 capsule (2 mg total) by mouth daily. 04/08/13  Yes Street, Stephanie Coup, MD  tretinoin (RETIN-A) 0.025 % gel Apply 1 application topically See admin instructions. Apply to the affected areas of the face at bedtime   Yes [provider]  vitamin C (VITAMIN C) 250 MG tablet Take 1 tablet (250 mg total) by mouth daily. 01/18/16  Yes Palma Holter, MD  VOLTAREN 1 % GEL APPLY 4 GRAMS TOPICALLY TO AFFECTED AREA(S) FOUR TIMES DAILY Patient taking differently: Apply 4 g topically See admin instructions. Apply 4 grams to affected areas four times a day 06/16/19  Yes Mullis, Kiersten P, DO  Zinc Oxide 40 % PSTE Apply 1 application topically 2 (two) times daily as needed (unspecified). Desitin Max strength paste apply thin layer topically to buttocks and scrotum Patient taking differently: Apply 1 application topically 2 (two) times daily as needed (to scrotum and buttocks- as directed). 06/07/20  Yes McDiarmid, Leighton Roach, MD  iron polysaccharides (NIFEREX) 150 MG capsule Take 150 mg by mouth daily at 12 noon.     [provider]  mupirocin  ointment (BACTROBAN) 2 % Apply 1 application topically daily as needed (wound care).    [provider]  saccharomyces boulardii (FLORASTOR) 250 MG capsule Take 1 capsule (250 mg total) by mouth 2 (two) times daily. Patient not taking: No sig reported 09/07/20   Lorin Glass, MD  senna-docusate (SENOKOT-S) 8.6-50 MG tablet Take 1 tablet by mouth at bedtime. Patient not taking: No sig reported 08/03/20   Marguerita Merles Latif, DO  triamcinolone cream (KENALOG) 0.1 % Apply 1 application topically 3 (three) times daily. Apply cream to left ankle three time daily 08/21/20   [provider]    Physical Exam: Vitals:   10/02/20 1815 10/02/20 2015 10/02/20 2115 10/02/20 2200  BP: (!) 102/57 103/63 130/71 (!) 129/91  Pulse: 71 76 72 71  Resp: 17 12 15 12   Temp:      TempSrc:      SpO2: 100% 100% 100% 100%    Constitutional: Awake alert and oriented x3, patient is in distress due to substantial pain surrounding the coccyx and left hip. Skin: Notable evidence of patchy skin breakdown over the left hip as well as over the coccyx just above the gluteal cleft.  Each wound is a stage III surrounded by significant erythema without induration or warmth.  There is also some mild skin breakdown, likely limited to the dermis over the left heel present on admission.  Good skin turgor noted.   Eyes: Pupils are equally reactive to light.  No evidence of scleral icterus or conjunctival pallor.  ENMT: Dry mucous membranes noted.  Posterior pharynx clear of any exudate or lesions.   Neck: normal, supple, no masses, no thyromegaly.  No evidence of jugular venous distension.   Respiratory: Markedly diminished breath sounds throughout the right lung fields.  Scattered rales and rhonchi.  Normal respiratory effort. No accessory muscle use.  Cardiovascular: Regular rate and rhythm, no murmurs / rubs / gallops. No extremity edema. 2+ pedal pulses. No carotid bruits.  Chest:   Nontender without crepitus or  deformity.   Back:   Significant pain on palpation along the coccyx  without crepitus or deformity.  Please see skin examination findings as above. Abdomen: Abdomen is soft and nontender.  No evidence of intra-abdominal masses.  Positive bowel sounds noted in all quadrants.   Musculoskeletal:  Notable contractures of the right upper extremity particularly the distal muscle groups.  Significant pain with manipulation of either leg.  Extremely limited range of motion of virtually every large joint.  Extremely poor muscle tone noted. Neurologic: CN 2-12 grossly intact. Sensation intact.  Patient moving all 4 extremities spontaneously however patient is unable to bear weight and is functionally quadriplegic.  Patient is following all commands.  Patient is responsive to verbal stimuli.   Psychiatric: Patient exhibits anxious mood with labile affect.  Some decreased comprehension of current situation noted however patient still seems to possess insight overall.    Labs on Admission: I have personally reviewed following labs and imaging studies -   CBC: Recent Labs  Lab 10/02/20 1857  WBC 9.9  NEUTROABS 6.2  HGB 12.0*  HCT 38.3*  MCV 81.3  PLT 309   Basic Metabolic Panel: Recent Labs  Lab 10/02/20 1857  NA 137  K 4.1  CL 103  CO2 28  GLUCOSE 95  BUN 11  CREATININE 0.48*  CALCIUM 9.1   GFR: CrCl cannot be calculated (Unknown ideal weight.). Liver Function Tests: Recent Labs  Lab 10/02/20 1857  AST 14*  ALT 13  ALKPHOS 108  BILITOT 0.4  PROT 6.6  ALBUMIN 2.8*   No results for input(s): LIPASE, AMYLASE in the last 168 hours. No results for input(s): AMMONIA in the last 168 hours. Coagulation Profile: No results for input(s): INR, PROTIME in the last 168 hours. Cardiac Enzymes: No results for input(s): CKTOTAL, CKMB, CKMBINDEX, TROPONINI in the last 168 hours. BNP (last 3 results) No results for input(s): PROBNP in the last 8760 hours. HbA1C: No results for input(s): HGBA1C  in the last 72 hours. CBG: No results for input(s): GLUCAP in the last 168 hours. Lipid Profile: No results for input(s): CHOL, HDL, LDLCALC, TRIG, CHOLHDL, LDLDIRECT in the last 72 hours. Thyroid Function Tests: No results for input(s): TSH, T4TOTAL, FREET4, T3FREE, THYROIDAB in the last 72 hours. Anemia Panel: No results for input(s): VITAMINB12, FOLATE, FERRITIN, TIBC, IRON, RETICCTPCT in the last 72 hours. Urine analysis:    Component Value Date/Time   COLORURINE YELLOW 09/06/2020 2206   APPEARANCEUR CLEAR 09/06/2020 2206   LABSPEC 1.015 09/06/2020 2206   PHURINE 6.0 09/06/2020 2206   GLUCOSEU NEGATIVE 09/06/2020 2206   HGBUR NEGATIVE 09/06/2020 2206   HGBUR negative 06/14/2010 1119   BILIRUBINUR NEGATIVE 09/06/2020 2206   BILIRUBINUR SMALL 01/04/2016 1634   KETONESUR 20 (A) 09/06/2020 2206   PROTEINUR NEGATIVE 09/06/2020 2206   UROBILINOGEN 2.0 (H) 02/05/2018 1825   NITRITE NEGATIVE 09/06/2020 2206   LEUKOCYTESUR NEGATIVE 09/06/2020 2206    Radiological Exams on Admission - Personally Reviewed: DG Chest 1 View  Result Date: 10/02/2020 CLINICAL DATA:  Cough. EXAM: CHEST  1 VIEW COMPARISON:  September 06, 2020 FINDINGS: The study is limited secondary to patient rotation. Mild atelectasis and/or infiltrate is seen within the right lung base. The left lung is clear. There is no evidence of a pleural effusion or pneumothorax. Stable cardiomegaly is seen. Radiopaque surgical clips are seen overlying the expected region of the esophageal hiatus. Degenerative changes seen throughout the thoracic spine. IMPRESSION: Limited study, as described above, with mild right basilar atelectasis and/or infiltrate. Electronically Signed   By: Aram Candelahaddeus  Houston M.D.   On: 10/02/2020 18:57    EKG: Personally reviewed.  Rhythm is normal sinus rhythm with heart rate of 74 bpm.  QTc 534 ms. no dynamic ST segment changes appreciated.  Assessment/Plan Principal Problem: Extensive  right lung infiltrates and  recurrent right pleural effusion   Patient presents with what seems to be a witnessed aspiration event at his group home the morning of 5/9  X-ray redemonstrates extensive right lung infiltrates and associated right pleural effusion  Review of serial x-rays reveals that these substantial infiltrates seem to have developed since approximately March of this year  I do suspect that infiltrates are primarily secondary to ongoing aspiration events.  He continues to experience these events despite maintaining a dysphagia 3 diet probably because he continually leans towards his right side even when eating   Despite the infiltrates patient is not exhibiting leukocytosis, fever respiratory distress or hypoxia at this time  I do not necessarily feel the patient is suffering from pneumonia and most likely is developing significant scarring of the right lung fields due to recurrent aspiration  Will discuss with and review images with pulmonology in the morning to determine best next steps for management of this disease of the right lung  Will order repeat speech therapy evaluation  We will keep n.p.o. for now  Supplemental oxygen necessary for bouts of hypoxia  Active Problems:   Dysphagia   Ongoing as mentioned above with continued aspiration events despite maintaining a dysphagia 3 diet recommended during last hospitalization  I have placed a consult order for repeat SLP evaluation  Stage III decubitus ulcer of left hip, as well as stage III decubitus ulceration of the sacral region (HCC)   No evidence of infection, wound care consultation placed  Frequent turns    Functional quadriplegia [facility])   Patient is bedbound and unable to ambulate due to marked limitation of use of his extremities  Fall precautions  Frequent turns    GERD without esophagitis  . Continuing home regimen of daily PPI therapy.    Code Status:  DNR Family Communication: Discussed patient's condition at  length with [name]  Status is: Observation  The patient remains OBS appropriate and will d/c before 2 midnights.  Dispo: The patient is from: Group home              Anticipated d/c is to: Group home              Patient currently is not medically stable to d/c.   Difficult to place patient No        Marinda Elk MD [facility] Pager [phone]  If 7PM-7AM, please contact night-coverage [email or web address] Use universal [place] password for that web site. If you do not have the password, please call the hospital operator.  10/03/2020, 12:56 AM

## 2020-10-03 NOTE — Consult Note (Signed)
WOC Nurse Consult Note: Patient receiving care in [facility] [phone]. Reason for Consult: wounds to left hip, sacrum, left heel Wound type: COCCYX has an evolving DTPI. Left trochanter has an unstageable PI. Left heel has heavy amounts of dry, flaking skin Pressure Injury POA: Yes Measurement: Coccyx maroon colored DTPI measures 1 cm x 1.8 cm. It currently has a foam dressing over it. Continue the use of the foam dressing to the area. The left trochanter has an unstageable PI that measures 1.2 cm x 2.3 cm and it has a thin yellow wound base. It has a foam dressing over it and the foam dressing is helping the yellow slough to liquify. Continue the use of the foam dressing to the area. No obvious wounds on left heel, only dry/flaking skin. The patient has a heel foam dressing on the area and is using a prevalon heel lift boot. The prevalon should always be used. Wound bed: Drainage (amount, consistency, odor)  Periwound: Dressing procedure/placement/frequency: Monitor the wound area(s) for worsening of condition such as: Signs/symptoms of infection,  Increase in size,  Development of or worsening of odor, Development of pain, or increased pain at the affected locations.  Notify the medical team if any of these develop.  Thank you for the consult.  Discussed plan of care with the patient.  WOC nurse will not follow at this time.  Please re-consult the WOC team if needed.  Helmut Muster, RN, MSN, CWOCN, CNS-BC, pager [phone]

## 2020-10-03 NOTE — ED Notes (Signed)
First encounter with patient. Patient reporting that his back side was hurting. This writer attempted to reposition patient. Pt reports a little bit more comfort at this time. Call light within reach, lights dimmed for patient comfort. No other needs at this time.

## 2020-10-03 NOTE — ED Notes (Signed)
Bladder scan 0ml

## 2020-10-03 NOTE — ED Notes (Signed)
Mepilex placed on patient left heel, left thigh/buttock area and coccyx, per MD request. Patient unable to assist in rolling, reported that all areas feel better after mepilex placed.

## 2020-10-03 NOTE — ED Notes (Signed)
Bladder scan showed .

## 2020-10-03 NOTE — Evaluation (Signed)
Clinical/Bedside Swallow Evaluation Patient Details  Name: [name] MRN: [id number] Date of Birth: August 15, 1960  Today's Date: 10/03/2020 Time: SLP Start Time (ACUTE ONLY): 1110 SLP Stop Time (ACUTE ONLY): 1135 SLP Time Calculation (min) (ACUTE ONLY): 25 min  Past Medical History:  Past Medical History:  Diagnosis Date  . Anemia    microcytic anemia   . Anxiety    takes Ativan daily as needed  . Bipolar disorder (HCC)    takes Depakote daily  . CEREBRAL PALSY 07/24/2006  . Chronic bronchitis (HCC)    "gets it q yr"  . Chronic bronchitis (HCC)   . 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)   . Constipation    takes Miralax daily as needed  . Depression    takes Risperdal nightly  . Environmental allergies    takes Zyrtec daily  . GASTRIC ULCER ACUTE WITH HEMORRHAGE 07/24/2006  . Gastric ulcer with hemorrhage    hx of   . GERD (gastroesophageal reflux disease)    takes Pravacid daily  . History of blood transfusion    no abnormal reaction noted  . History of hiatal hernia   . History of shingles   . History of stomach ulcers ~ 1996  . Hypercholesterolemia   . Intellectual functioning disability   . Intermittent explosive disorder   . Internal hemorrhoids   . MRSA infection 09/06/2019  . Obsessive compulsive disorder   . Osteomyelitis (HCC)    right foot   . Pneumonia "quite a few times"   in 2015 and in June 2016  . Pressure ulcer of sacral region    hx of   . Pseudomonas aeruginosa infection 09/06/2019  . Psychotic disorder (HCC)   . Recurrent UTI (urinary tract infection)   . Seborrheic dermatitis   . Sigmoid diverticulosis   . Spastic tetraplegia (HCC)   . URINARY INCONTINENCE 02/09/2010   takes Detrol daily-occasionally incontinence  . Vitamin D deficiency   . Weakness    numbness in extremities   Past Surgical History:  Past Surgical History:  Procedure Laterality Date  . AMPUTATION Right 12/14/2014   Procedure: AMPUTATION DIGIT  RIGHT GREAT TOE MTP;  Surgeon: Nadara Mustard, MD;  Location: [facility];  Service: Orthopedics;  Laterality: Right;  . AMPUTATION Right 10/08/2019   Procedure: RIGHT FIFTH RAY AMPUTATION, ADJACENT TISSUE TRANSFER;  Surgeon: Park Liter, DPM;  Location: [facility];  Service: Podiatry;  Laterality: Right;  . AMPUTATION Right 07/27/2020   Procedure: AMPUTATION BELOW KNEE;  Surgeon: Toni Arthurs, MD;  Location: [facility] OR;  Service: Orthopedics;  Laterality: Right;   . BIOPSY  08/12/2018   Procedure: BIOPSY;  Surgeon: Lynann Bologna, MD;  Location: [facility];  Service: Gastroenterology;;  . COLONOSCOPY WITH PROPOFOL N/A 08/14/2018   Procedure: COLONOSCOPY WITH PROPOFOL;  Surgeon: Lynann Bologna, MD;  Location: [facility];  Service: Gastroenterology;  Laterality: N/A;  . ESOPHAGOGASTRODUODENOSCOPY (EGD) WITH PROPOFOL N/A 08/12/2018   Procedure: ESOPHAGOGASTRODUODENOSCOPY (EGD) WITH PROPOFOL;  Surgeon: Lynann Bologna, MD;  Location: [facility];  Service: Gastroenterology;  Laterality: N/A;  . GRAFT APPLICATION Right 11/12/2019   Procedure: SKIN GRAFT SUBSTITUTE APPLICATION;  Surgeon: Park Liter, DPM;  Location: [facility];  Service: Podiatry;  Laterality: Right;  . HERNIA REPAIR    . HIATAL HERNIA REPAIR  1982  . LEG SURGERY Bilateral ~ 1967   "legs were scissored; stretched them out"  . 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 Of note, patient was admitted to [facility] from 3/7 until 3/10  for diffuse right-sided infiltrates thought to be secondary to right lung pneumonia.   Assessment / Plan / Recommendation Clinical Impression  Pt with hx of dysphagia and recurrent PNA. He reports he "even gets choked on ground textures". Of note, pt with recent MBSS 09/07/20 without evidence of aspiration and diet recommendations of mechanical soft and thin liquids. Pts dysphagia suspected to be episodic in nature. This date pt with immediate and delayed overt coughing with ice chips and thin liquids concerning for decreased airway protection. Wet vocal quality noted following solid POs, however could be attributed to reduced saliva management. No overt s/sx of aspiration with puree and nectar thick liquids. Recommend conservative dysphagia 1 (puree) and nectar (mildly) thick liquids with meds in puree. SLP to follow up. Pt and RN in agreement.   SLP Visit Diagnosis: Dysphagia, oropharyngeal phase (R13.12)    Aspiration Risk  Moderate aspiration risk;Mild aspiration risk    Diet Recommendation   Dysphagia 1 (puree) nectar (mildly) thick liquids   Medication Administration: Whole meds with puree    Other  Recommendations Oral Care Recommendations: Oral care before and after PO Other Recommendations: Order thickener from pharmacy   Follow up Recommendations Other (comment) (group home)      Frequency and Duration min 2x/week  2 weeks       Prognosis Prognosis for Safe Diet Advancement: Good Barriers to Reach Goals: Cognitive deficits      Swallow Study   General Date of Onset: 10/02/20 HPI: 60 year old male with past medical history of cerebral palsy with intellectual disability, functional quadriplegia, bipolar disorder, 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 Of note,  patient was admitted to [facility] from 3/7 until 3/10 for diffuse right-sided infiltrates thought to be secondary to right lung pneumonia. Type of Study: Bedside Swallow Evaluation Previous Swallow Assessment: MBSS 09/07/20; D3, thin recommendations Diet Prior to this Study: NPO Temperature Spikes Noted: No Respiratory Status: Room air History of Recent Intubation: No Behavior/Cognition: Alert;Cooperative;Pleasant mood Oral Cavity Assessment: Within Functional Limits Oral Care Completed by SLP: Yes Oral Cavity - Dentition: Adequate natural dentition;Missing dentition Vision: Functional for self-feeding Self-Feeding Abilities: Needs assist Patient Positioning: Upright in bed Baseline Vocal Quality: Low vocal intensity Volitional Cough: Strong Volitional Swallow: Able to elicit    Oral/Motor/Sensory Function Overall Oral Motor/Sensory Function: Within functional limits   Ice Chips Ice chips: Impaired Presentation: Spoon Oral Phase Functional Implications: Prolonged oral transit Pharyngeal Phase Impairments: Suspected delayed Swallow;Multiple swallows;Cough - Immediate   Thin Liquid Thin Liquid: Impaired Presentation: Cup;Straw Oral Phase Functional Implications: Prolonged oral transit Pharyngeal  Phase Impairments: Suspected delayed Swallow;Multiple swallows;Cough - Delayed;Throat Clearing - Immediate;Wet Vocal Quality    Nectar Thick Nectar Thick Liquid: Within functional limits Presentation: Cup   Honey Thick Honey Thick Liquid: Not tested   Puree Puree: Within functional limits   Solid  Solid: Impaired Presentation: Spoon Oral Phase Impairments: Impaired mastication Oral Phase Functional Implications: Prolonged oral transit Pharyngeal Phase Impairments: Suspected delayed Swallow;Multiple swallows;Wet Vocal Quality      Lennan Malone H. MA, CCC-SLP Acute Rehabilitation Services   10/03/2020,11:54 AM

## 2020-10-03 NOTE — ED Notes (Signed)
Following bladder scan, patient unable to grab urinal in time and urinated in the bed. Linens changed and patient dry.

## 2020-10-03 NOTE — Progress Notes (Signed)
Pharmacy Antibiotic Note  [name] is a 60 y.o. male admitted on 10/02/2020 with pleural effusions and possible PNA. Pharmacy has been consulted for Unasyn dosing. Cr <1 mg/dl.   Plan: Unasyn 3g IV q6h     Temp (24hrs), Avg:98.2 F (36.8 C), Min:98.2 F (36.8 C), Max:98.2 F (36.8 C)  Recent Labs  Lab 10/02/20 1857 10/02/20 2040 10/03/20 0340  WBC 9.9  --  8.1  CREATININE 0.48*  --  0.43*  LATICACIDVEN 2.1* 1.4  --     CrCl cannot be calculated (Unknown ideal weight.).    Allergies  Allergen Reactions  . Adhesive [Tape] Rash    Antimicrobials this admission: Unasyn 5/10 >>    Thank you for allowing pharmacy to be a part of this patient's care.  Fredonia Highland, PharmD, BCPS, [facility] Clinical Pharmacist [phone] Please check AMION for all [facility] Pharmacy numbers 10/03/2020

## 2020-10-04 LAB — CBC
HCT: 35.4 % — ABNORMAL LOW (ref 39.0–52.0)
Hemoglobin: 11.6 g/dL — ABNORMAL LOW (ref 13.0–17.0)
MCH: 25.6 pg — ABNORMAL LOW (ref 26.0–34.0)
MCHC: 32.8 g/dL (ref 30.0–36.0)
MCV: 78 fL — ABNORMAL LOW (ref 80.0–100.0)
Platelets: 289 10*3/uL (ref 150–400)
RBC: 4.54 MIL/uL (ref 4.22–5.81)
RDW: 17 % — ABNORMAL HIGH (ref 11.5–15.5)
WBC: 8 10*3/uL (ref 4.0–10.5)
nRBC: 0 % (ref 0.0–0.2)

## 2020-10-04 LAB — BASIC METABOLIC PANEL
Anion gap: 5 (ref 5–15)
BUN: 8 mg/dL (ref 6–20)
CO2: 25 mmol/L (ref 22–32)
Calcium: 8.9 mg/dL (ref 8.9–10.3)
Chloride: 106 mmol/L (ref 98–111)
Creatinine, Ser: 0.5 mg/dL — ABNORMAL LOW (ref 0.61–1.24)
GFR, Estimated: >60 mL/min (ref >60)
Glucose, Bld: 92 mg/dL (ref 70–99)
Potassium: 4 mmol/L (ref 3.5–5.1)
Sodium: 136 mmol/L (ref 135–145)

## 2020-10-04 LAB — GLUCOSE, CAPILLARY: Glucose-Capillary: 96 mg/dL (ref 70–99)

## 2020-10-04 MED ORDER — POLYSACCHARIDE IRON COMPLEX 150 MG PO CAPS: 150.0000 mg | ORAL_CAPSULE | Freq: Every day | ORAL | Status: DC

## 2020-10-04 MED ORDER — POLYSACCHARIDE IRON COMPLEX 150 MG PO CAPS
150.0000 mg | ORAL_CAPSULE | Freq: Every day | ORAL | Status: DC
  Administered 2020-10-05 – 2020-10-06 (×2): 150 mg via ORAL
  Filled 2020-10-04 (×2): qty 1

## 2020-10-04 MED ORDER — FLEET ENEMA 7-19 GM/118ML RE ENEM
1.0000 | ENEMA | Freq: Once | RECTAL | Status: AC
  Administered 2020-10-04: 1 via RECTAL
  Filled 2020-10-04: qty 1

## 2020-10-04 MED ORDER — BACLOFEN 20 MG PO TABS
20.0000 mg | ORAL_TABLET | Freq: Three times a day (TID) | ORAL | Status: DC
Start: 2020-10-04 — End: 2020-10-06
  Administered 2020-10-04 – 2020-10-06 (×6): 20 mg via ORAL
  Filled 2020-10-04 (×6): qty 1

## 2020-10-04 MED ORDER — RISPERIDONE 0.5 MG PO TABS
2.0000 mg | ORAL_TABLET | Freq: Every day | ORAL | Status: DC
  Administered 2020-10-04 – 2020-10-05 (×2): 2 mg via ORAL
  Filled 2020-10-04 (×2): qty 4

## 2020-10-04 MED ORDER — POLYETHYLENE GLYCOL 3350 17 G PO PACK
17.0000 g | PACK | Freq: Two times a day (BID) | ORAL | Status: DC
  Administered 2020-10-04 – 2020-10-06 (×2): 17 g via ORAL
  Filled 2020-10-04 (×2): qty 1

## 2020-10-04 MED ORDER — SENNOSIDES-DOCUSATE SODIUM 8.6-50 MG PO TABS: 2.0000 | ORAL_TABLET | Freq: Every day | ORAL | Status: DC

## 2020-10-04 NOTE — Progress Notes (Signed)
PROGRESS NOTE        PATIENT DETAILS Name: [name] Age: 60 y.o. Sex: male Date of Birth: 12/17/1960 Admit Date: 10/02/2020 Admitting Physician Marinda Elk, MD GPQ:[id number], [name], MD  Brief Narrative: Patient is a 60 y.o. male cerebral palsy, intellectual disability, functional quadriplegia, bipolar disorder, recent right BKA, constipation, multiple decubitus ulcers-presented with cough, shortness of breath-found to have likely aspiration pneumonia and subsequently admitted to the hospitalist service.  Significant events: 5/11>> presenting with shortness of breath-felt to be aspiration pneumonia.  Significant studies: 5/9>> chest x-ray: Right basilar atelectasis/infiltrate (rotated phlegm).  Antimicrobial therapy: None  Microbiology data: 5/10 >>blood culture: No growth  Procedures : None  Consults: None  DVT Prophylaxis : enoxaparin (LOVENOX) injection 40 mg Start: 10/03/20 1000   Subjective: Feels better-lying comfortably in bed.  Assessment/Plan: Aspiration pneumonia: Continue Unasyn-appreciate SLP eval-on dysphagia 1 diet with thick liquids.  He feels better-if clinical improvement continues-suspect he could be changed to oral antimicrobial therapy tomorrow.  Dysphagia: In the setting of cerebral palsy-on dysphagia 1 diet-SLP following.  GERD: Continue PPI  Functional quadriplegia: Bedbound at baseline.  History of right BKA  Stage III pressure ulcer in the sacral area: Appreciate wound care evaluation.  History of cerebral palsy: Resume baclofen and risperidone.  Diet: Diet Order            DIET DYS 2 Room service appropriate? No; Fluid consistency: Thin  Diet effective now                  Code Status:  DNR  Family Communication: Spoke with sister-Dianne-[phone] over phone on 5/11  Disposition Plan: Status is: Inpatient  Remains inpatient appropriate because:Inpatient level of care appropriate  due to severity of illness   Dispo: The patient is from: Group home              Anticipated d/c is to: Group home              Patient currently is not medically stable to d/c.   Difficult to place patient No    Barriers to Discharge: Improving aspiration pneumonia-on IV Unasyn.  Antimicrobial agents: Anti-infectives (From admission, onward)   Start     Dose/Rate Route Frequency Ordered Stop   10/03/20 1630  Ampicillin-Sulbactam (UNASYN) 3 g in sodium chloride 0.9 % 100 mL IVPB        3 g 200 mL/hr over 30 Minutes Intravenous Every 6 hours 10/03/20 1533     10/02/20 2115  Ampicillin-Sulbactam (UNASYN) 3 g in sodium chloride 0.9 % 100 mL IVPB        3 g 200 mL/hr over 30 Minutes Intravenous  Once 10/02/20 2029 10/02/20 2205       Time spent: 25 minutes-Greater than 50% of this time was spent in counseling, explanation of diagnosis, planning of further management, and coordination of care.  MEDICATIONS: Scheduled Meds: . bisacodyl  10 mg Rectal QODAY  . enoxaparin (LOVENOX) injection  40 mg Subcutaneous Daily  . fluticasone  1 spray Each Nare Daily  . pantoprazole  40 mg Oral BID AC   Continuous Infusions: . ampicillin-sulbactam (UNASYN) IV 3 g (10/04/20 0934)   PRN Meds:.acetaminophen, albuterol, food thickener   PHYSICAL EXAM: Vital signs: Vitals:   10/03/20 2002 10/04/20 0001 10/04/20 0746 10/04/20 1216  BP: 98/62 106/62 (!) 89/52 (!) 111/55  Pulse: 89 76 70 81  Resp: 17 18 14 17   Temp: 99.3 F (37.4 C) 99 F (37.2 C)    TempSrc: Axillary Axillary    SpO2: 96% 96% 96% 96%   There were no vitals filed for this visit. There is no height or weight on file to calculate BMI.   Gen Exam:Alert awake-not in any distress HEENT:atraumatic, normocephalic Chest: B/L clear to auscultation anteriorly CVS:S1S2 regular Abdomen:soft non tender, non distended Extremities: Right BKA Skin: no rash  I have personally reviewed following labs and imaging  studies  LABORATORY DATA: CBC: Recent Labs  Lab 10/02/20 1857 10/03/20 0340 10/04/20 0549  WBC 9.9 8.1 8.0  NEUTROABS 6.2 4.6  --   HGB 12.0* 11.1* 11.6*  HCT 38.3* 34.9* 35.4*  MCV 81.3 80.6 78.0*  PLT 309 288 289    Basic Metabolic Panel: Recent Labs  Lab 10/02/20 1857 10/03/20 0340 10/04/20 0549  NA 137 139 136  K 4.1 3.5 4.0  CL 103 106 106  CO2 28 26 25   GLUCOSE 95 85 92  BUN 11 9 8   CREATININE 0.48* 0.43* 0.50*  CALCIUM 9.1 8.9 8.9    GFR: CrCl cannot be calculated (Unknown ideal weight.).  Liver Function Tests: Recent Labs  Lab 10/02/20 1857 10/03/20 0340  AST 14* 15  ALT 13 12  ALKPHOS 108 98  BILITOT 0.4 0.3  PROT 6.6 5.8*  ALBUMIN 2.8* 2.5*   No results for input(s): LIPASE, AMYLASE in the last 168 hours. No results for input(s): AMMONIA in the last 168 hours.  Coagulation Profile: Recent Labs  Lab 10/03/20 0340  INR 1.0    Cardiac Enzymes: No results for input(s): CKTOTAL, CKMB, CKMBINDEX, TROPONINI in the last 168 hours.  BNP (last 3 results) No results for input(s): PROBNP in the last 8760 hours.  Lipid Profile: No results for input(s): CHOL, HDL, LDLCALC, TRIG, CHOLHDL, LDLDIRECT in the last 72 hours.  Thyroid Function Tests: No results for input(s): TSH, T4TOTAL, FREET4, T3FREE, THYROIDAB in the last 72 hours.  Anemia Panel: No results for input(s): VITAMINB12, FOLATE, FERRITIN, TIBC, IRON, RETICCTPCT in the last 72 hours.  Urine analysis:    Component Value Date/Time   COLORURINE YELLOW 09/06/2020 2206   APPEARANCEUR CLEAR 09/06/2020 2206   LABSPEC 1.015 09/06/2020 2206   PHURINE 6.0 09/06/2020 2206   GLUCOSEU NEGATIVE 09/06/2020 2206   HGBUR NEGATIVE 09/06/2020 2206   HGBUR negative 06/14/2010 1119   BILIRUBINUR NEGATIVE 09/06/2020 2206   BILIRUBINUR SMALL 01/04/2016 1634   KETONESUR 20 (A) 09/06/2020 2206   PROTEINUR NEGATIVE 09/06/2020 2206   UROBILINOGEN 2.0 (H) 02/05/2018 1825   NITRITE NEGATIVE 09/06/2020  2206   LEUKOCYTESUR NEGATIVE 09/06/2020 2206    Sepsis Labs: Lactic Acid, Venous    Component Value Date/Time   LATICACIDVEN 1.4 10/02/2020 2040    MICROBIOLOGY: Recent Results (from the past 240 hour(s))  Culture, blood (routine x 2)     Status: None (Preliminary result)   Collection Time: 10/03/20  3:43 AM   Specimen: BLOOD  Result Value Ref Range Status   Specimen Description BLOOD LEFT HAND  Final   Special Requests   Final    BOTTLES DRAWN AEROBIC AND ANAEROBIC Blood Culture results may not be optimal due to an inadequate volume of blood received in culture bottles   Culture   Final    NO GROWTH 1 DAY Performed at [facility], [address], [place], [facility] [address]    Report Status PENDING  Incomplete  SARS CORONAVIRUS 2 (TAT 6-24 HRS) Nasopharyngeal Nasopharyngeal Swab     Status: None   Collection Time: 10/03/20  5:20 AM   Specimen: Nasopharyngeal Swab  Result Value Ref Range Status   SARS Coronavirus 2 NEGATIVE NEGATIVE Final    Comment: (NOTE) SARS-CoV-2 target nucleic acids are NOT DETECTED.  The SARS-CoV-2 RNA is generally detectable in upper and lower respiratory specimens during the acute phase of infection. Negative results do not preclude SARS-CoV-2 infection, do not rule out co-infections with other pathogens, and should not be used as the sole basis for treatment or other patient management decisions. Negative results must be combined with clinical observations, patient history, and epidemiological information. The expected result is Negative.  Fact Sheet for Patients: HairSlick.no  Fact Sheet for Healthcare Providers: quierodirigir.com  This test is not yet approved or cleared by the [place] FDA and  has been authorized for detection and/or diagnosis of SARS-CoV-2 by FDA under an Emergency Use Authorization (EUA). This EUA will remain  in effect (meaning this test can be used)  for the duration of the COVID-19 declaration under Se ction 564(b)(1) of the Act, 21 U.S.C. section 360bbb-3(b)(1), unless the authorization is terminated or revoked sooner.  Performed at [facility], [address], [place], [place] [address]   Culture, blood (routine x 2)     Status: None (Preliminary result)   Collection Time: 10/03/20  7:57 AM   Specimen: BLOOD RIGHT HAND  Result Value Ref Range Status   Specimen Description BLOOD RIGHT HAND  Final   Special Requests   Final    BOTTLES DRAWN AEROBIC AND ANAEROBIC Blood Culture adequate volume   Culture   Final    NO GROWTH < 24 HOURS Performed at [facility], [address], [place], [place] [address]    Report Status PENDING  Incomplete    RADIOLOGY STUDIES/RESULTS: DG Chest 1 View  Result Date: 10/02/2020 CLINICAL DATA:  Cough. EXAM: CHEST  1 VIEW COMPARISON:  September 06, 2020 FINDINGS: The study is limited secondary to patient rotation. Mild atelectasis and/or infiltrate is seen within the right lung base. The left lung is clear. There is no evidence of a pleural effusion or pneumothorax. Stable cardiomegaly is seen. Radiopaque surgical clips are seen overlying the expected region of the esophageal hiatus. Degenerative changes seen throughout the thoracic spine. IMPRESSION: Limited study, as described above, with mild right basilar atelectasis and/or infiltrate. Electronically Signed   By: Aram Candela M.D.   On: 10/02/2020 18:57     LOS: 1 day   Jeoffrey Massed, MD  [facility]    To contact the attending provider between 7A-7P or the covering provider during after hours 7P-7A, please log into the web site [email or web address] and access using universal [place] password for that web site. If you do not have the password, please call the hospital operator.  10/04/2020, 2:16 PM

## 2020-10-04 NOTE — Plan of Care (Signed)

## 2020-10-04 NOTE — Progress Notes (Signed)
Speech Language Pathology Treatment: Dysphagia  Patient Details Name: [name] MRN: [id number] DOB: 1960/08/18 Today's Date: 10/04/2020 Time: 3244-0102 SLP Time Calculation (min) (ACUTE ONLY): 28.28 min  Assessment / Plan / Recommendation Clinical Impression  Pt was seen for dysphagia treatment. Pt verbalized his displeasure regarding his being on a modified diet stating that he has teeth and the nectar thick liquids do not taste good. Pt tolerated trials of dysphagia 3 solids and thin liquids via straw without overt s/sx of aspiration. Mastication was prolonged with regular textures and mild oral residue was cleared with secondary swallows and/or a liquid wash. A mildly wet vocal quality was noted once following repeated trials of regular texture solids and this was cleared with delayed coughing. Pt is at increased risk of aspiration and aspiration-related complications at baseline due to his bed-bound status, his being difficult to position adequately during p.o. intake, his frequent dependence for feeding, and the pharyngeal delay noted during the most recent modified barium swallow study in April. Pt's diet will be advanced to dysphagia 2 solids and thin liquids with strict observance of swallowing precautions. SLP follow closely to ensure diet tolerance.    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 Of note, patient was admitted to [facility] from 3/7 until 3/10 for diffuse right-sided infiltrates thought to be secondary to right lung pneumonia. MBSS 09/07/20 without evidence of aspiration and diet recommendations of mechanical soft and  thin liquids.      SLP Plan  Continue with current plan of care       Recommendations  Diet recommendations: Dysphagia 2 (fine chop);Thin liquid Liquids provided via: Cup;Straw Medication Administration: Whole meds with puree Supervision: Staff to assist with self feeding Compensations: Slow rate;Small sips/bites;Follow solids with liquid Postural Changes and/or Swallow Maneuvers: Seated upright 90 degrees                Oral Care Recommendations: Oral care before and after PO Follow up Recommendations: Other (comment) (group home) SLP Visit Diagnosis: Dysphagia, oropharyngeal phase (R13.12) Plan: Continue with current plan of care       Alphonsa Brickle I. Vear Clock, MS, CCC-SLP Acute Rehabilitation Services Office number [phone] Pager [phone]               Scheryl Marten 10/04/2020, 11:19 AM

## 2020-10-05 MED ORDER — ENSURE ENLIVE PO LIQD
237.0000 mL | Freq: Two times a day (BID) | ORAL | Status: DC
  Administered 2020-10-05 – 2020-10-06 (×2): 237 mL via ORAL

## 2020-10-05 MED ORDER — ADULT MULTIVITAMIN W/MINERALS CH
1.0000 | ORAL_TABLET | Freq: Every day | ORAL | Status: DC
  Administered 2020-10-05 – 2020-10-06 (×2): 1 via ORAL
  Filled 2020-10-05 (×2): qty 1

## 2020-10-05 MED ORDER — ORAL CARE MOUTH RINSE
15.0000 mL | Freq: Two times a day (BID) | OROMUCOSAL | Status: DC
  Administered 2020-10-05 – 2020-10-06 (×2): 15 mL via OROMUCOSAL

## 2020-10-05 MED ORDER — IBUPROFEN 600 MG PO TABS
600.0000 mg | ORAL_TABLET | Freq: Once | ORAL | Status: AC
  Administered 2020-10-05: 600 mg via ORAL
  Filled 2020-10-05: qty 1

## 2020-10-05 NOTE — Progress Notes (Signed)
Speech Language Pathology Treatment: Dysphagia  Patient Details Name: OVAL LITTREL MRN: [id number] DOB: 1960/12/27 Today's Date: 10/05/2020 Time: 2130-8657 SLP Time Calculation (min) (ACUTE ONLY): 12 min  Assessment / Plan / Recommendation Clinical Impression  Pt was seen for dysphagia treatment during the end of breakfast. He consumed a meal of scrambled eggs, ground sausage, puree, and thin liquids via straw. No s/sx of aspiration were noted during the session, but pt stated that he "got choked once" during breakfast. It is noteworthy that the pt was inadequately upright upon SLP's arrival and the impact of this on his performance is questioned. Considering his documented pharyngeal delay, a chin tuck posture was attempted during the session. Pt reported that swallowing felt easier, but the impact of this on physiology cannot be determined at bedside. Mastication and oral clearance of solids were adequate. Pt's current diet of dysphagia 2 solids and thin liquids will be continued at this time and SLP will follow.    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 Of note, patient was admitted to [facility] from 3/7 until 3/10 for diffuse right-sided infiltrates thought to be secondary to right lung pneumonia. MBSS 09/07/20 without evidence of aspiration and diet recommendations of mechanical soft and thin liquids.      SLP Plan  Continue with current plan of care       Recommendations  Diet recommendations: Dysphagia 2 (fine chop);Thin liquid Liquids provided via: Cup;Straw Medication Administration: Whole meds with puree Supervision:  Staff to assist with self feeding Compensations: Slow rate;Small sips/bites;Follow solids with liquid Postural Changes and/or Swallow Maneuvers: Seated upright 90 degrees                Oral Care Recommendations: Oral care before and after PO Follow up Recommendations: Other (comment) (group home) SLP Visit Diagnosis: Dysphagia, oropharyngeal phase (R13.12) Plan: Continue with current plan of care       Starlena Beil I. Vear Clock, MS, CCC-SLP Acute Rehabilitation Services Office number [phone] Pager [phone]                Scheryl Marten 10/05/2020, 10:44 AM

## 2020-10-05 NOTE — Plan of Care (Signed)

## 2020-10-05 NOTE — Progress Notes (Signed)
Initial Nutrition Assessment  DOCUMENTATION CODES:   Not applicable  INTERVENTION:   -Ensure Enlive po BID, each supplement provides 350 kcal and 20 grams of protein -Magic cup TID with meals, each supplement provides 290 kcal and 9 grams of protein -MVI with minerals daily  NUTRITION DIAGNOSIS:   Increased nutrient needs related to wound healing as evidenced by estimated needs.  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  Pt admitted with extensive rt lung infiltrates and recurrent rt pleural effusion.   5/10- s/p BSE- advanced to dysphagia 1 diet with nectar thick liquids 5/11- s/p BSE- advanced to dysphagia 2 diet with thin liquids  Reviewed I/O's: -3.6 L x 24 hours and -1.6 L since admission  UOP: 3.6 L x 24 hours   Pt unavailable at time of visit. Attempted to speak with pt via call to hospital room phone, however, unable to reach. RD unable to obtain further nutrition-related history or complete nutrition-focused physical exam at this time.   Per MD notes, pt with possible aspiration event at group home.   Per [facility] notes, pt with DTPI to coccyx, unstageable PI to lt trochanter and heavy amounts of flaking skin to lt heel.   No meal completions documented to assess at this time.   Reviewed wt hx; no wt loss documented in the past year.   Pt with increased nutritional needs secondary to wound healing and  would benefit from addition of oral nutrition supplements.   Medications reviewed and include senokot.   Per MD notes, possible discharge back to group home on 10/06/20 if pt continues to progress.   Labs reviewed: CBGS: 96.  Diet Order:   Diet Order            DIET DYS 2 Room service appropriate? No; Fluid consistency: Thin  Diet effective now                 EDUCATION NEEDS:   Education needs have been addressed  Skin:  Skin Assessment: Skin Integrity Issues: Skin Integrity Issues:: DTI,Unstageable,Other (Comment) DTI: coccyx Unstageable: lt trochanter Other: heavy amounts of flaking skin to lt heel  Last BM:  10/05/20  Height:   Ht Readings from Last 1 Encounters:  09/04/20 6\' 2"  (1.88 m)    Weight:   Wt Readings from Last 1 Encounters:  09/04/20 98.4 kg    Ideal Body Weight:  80.75 kg (adjusted for rt BKA)  BMI:  There is no height or weight on file to calculate BMI.  Estimated Nutritional Needs:   Kcal:  2400-2600  Protein:  125-150 grams  Fluid:  > 2 L    11/04/20, RD, LDN, CDCES Registered Dietitian II Certified Diabetes Care and Education Specialist Please refer to [facility] for RD and/or RD on-call/weekend/after hours pager

## 2020-10-05 NOTE — Progress Notes (Signed)
PROGRESS NOTE        PATIENT DETAILS Name: [name] Age: 60 y.o. Sex: male Date of Birth: 03/09/1961 Admit Date: 10/02/2020 Admitting Physician Marinda Elk, MD [id number], [name], MD  Brief Narrative: Patient is a 60 y.o. male cerebral palsy, intellectual disability, functional quadriplegia, bipolar disorder, recent right BKA, constipation, multiple decubitus ulcers-presented with cough, shortness of breath-found to have likely aspiration pneumonia and subsequently admitted to the hospitalist service.  Significant events: 5/11>> presenting with shortness of breath-felt to be aspiration pneumonia.  Significant studies: 5/9>> chest x-ray: Right basilar atelectasis/infiltrate (rotated phlegm).  Antimicrobial therapy: Unasyn: 5/9>>  Microbiology data: 5/10 >>blood culture: No growth 5/12>> blood culture: Pending  Procedures : None  Consults: None  DVT Prophylaxis : enoxaparin (LOVENOX) injection 40 mg Start: 10/03/20 1000   Subjective: Some cough continues-low-grade fever overnight.  Trying to have bowel movement earlier this morning.  Assessment/Plan: Aspiration pneumonia: Overall better-some cough continues-did have low-grade fever overnight-hence we will continue with IV Unasyn for another day.  Evaluated by SLP-has been upgraded to a dysphagia 2 diet today.   Dysphagia: In the setting of cerebral palsy-tolerating dysphagia 2 diet-SLP following.  GERD: Continue PPI  Functional quadriplegia: Bedbound at baseline.  History of right BKA  Stage III pressure ulcer in the sacral area: Appreciate wound care evaluation.  History of cerebral palsy: Continue baclofen and risperidone.  Diet: Diet Order            DIET DYS 2 Room service appropriate? No; Fluid consistency: Thin  Diet effective now                  Code Status:  DNR  Family Communication: Spoke with sister-Dianne-[phone] over phone on  5/11  Disposition Plan: Status is: Inpatient  Remains inpatient appropriate because:Inpatient level of care appropriate due to severity of illness   Dispo: The patient is from: Group home              Anticipated d/c is to: Group home              Patient currently is not medically stable to d/c.   Difficult to place patient No    Barriers to Discharge: Improving aspiration pneumonia-on IV Unasyn.  If clinical improvement continues-back to group home on 5/13.  Antimicrobial agents: Anti-infectives (From admission, onward)   Start     Dose/Rate Route Frequency Ordered Stop   10/03/20 1630  Ampicillin-Sulbactam (UNASYN) 3 g in sodium chloride 0.9 % 100 mL IVPB        3 g 200 mL/hr over 30 Minutes Intravenous Every 6 hours 10/03/20 1533     10/02/20 2115  Ampicillin-Sulbactam (UNASYN) 3 g in sodium chloride 0.9 % 100 mL IVPB        3 g 200 mL/hr over 30 Minutes Intravenous  Once 10/02/20 2029 10/02/20 2205       Time spent: 25 minutes-Greater than 50% of this time was spent in counseling, explanation of diagnosis, planning of further management, and coordination of care.  MEDICATIONS: Scheduled Meds: . baclofen  20 mg Oral TID  . bisacodyl  10 mg Rectal QODAY  . enoxaparin (LOVENOX) injection  40 mg Subcutaneous Daily  . fluticasone  1 spray Each Nare Daily  . iron polysaccharides  150 mg Oral Q1200  . pantoprazole  40 mg Oral BID  AC  . polyethylene glycol  17 g Oral BID  . risperiDONE  2 mg Oral QHS  . senna-docusate  2 tablet Oral QHS   Continuous Infusions: . ampicillin-sulbactam (UNASYN) IV 3 g (10/05/20 1057)   PRN Meds:.acetaminophen, albuterol, food thickener   PHYSICAL EXAM: Vital signs: Vitals:   10/04/20 1958 10/04/20 2342 10/05/20 0357 10/05/20 0715  BP: 110/74 137/76 131/74 (!) 143/63  Pulse: 80 83 67 78  Resp: 19 17 19 18   Temp: 100.1 F (37.8 C) (!) 100.7 F (38.2 C) 98.8 F (37.1 C) 97.8 F (36.6 C)  TempSrc: Axillary Axillary Axillary Oral   SpO2: 95% 94% 97%    There were no vitals filed for this visit. There is no height or weight on file to calculate BMI.   Gen Exam:Alert awake-not in any distress HEENT:atraumatic, normocephalic Chest: B/L clear to auscultation anteriorly CVS:S1S2 regular Abdomen:soft non tender, non distended Extremities: Right BKA Skin: no rash  I have personally reviewed following labs and imaging studies  LABORATORY DATA: CBC: Recent Labs  Lab 10/02/20 1857 10/03/20 0340 10/04/20 0549  WBC 9.9 8.1 8.0  NEUTROABS 6.2 4.6  --   HGB 12.0* 11.1* 11.6*  HCT 38.3* 34.9* 35.4*  MCV 81.3 80.6 78.0*  PLT 309 288 289    Basic Metabolic Panel: Recent Labs  Lab 10/02/20 1857 10/03/20 0340 10/04/20 0549  NA 137 139 136  K 4.1 3.5 4.0  CL 103 106 106  CO2 28 26 25   GLUCOSE 95 85 92  BUN 11 9 8   CREATININE 0.48* 0.43* 0.50*  CALCIUM 9.1 8.9 8.9    GFR: CrCl cannot be calculated (Unknown ideal weight.).  Liver Function Tests: Recent Labs  Lab 10/02/20 1857 10/03/20 0340  AST 14* 15  ALT 13 12  ALKPHOS 108 98  BILITOT 0.4 0.3  PROT 6.6 5.8*  ALBUMIN 2.8* 2.5*   No results for input(s): LIPASE, AMYLASE in the last 168 hours. No results for input(s): AMMONIA in the last 168 hours.  Coagulation Profile: Recent Labs  Lab 10/03/20 0340  INR 1.0    Cardiac Enzymes: No results for input(s): CKTOTAL, CKMB, CKMBINDEX, TROPONINI in the last 168 hours.  BNP (last 3 results) No results for input(s): PROBNP in the last 8760 hours.  Lipid Profile: No results for input(s): CHOL, HDL, LDLCALC, TRIG, CHOLHDL, LDLDIRECT in the last 72 hours.  Thyroid Function Tests: No results for input(s): TSH, T4TOTAL, FREET4, T3FREE, THYROIDAB in the last 72 hours.  Anemia Panel: No results for input(s): VITAMINB12, FOLATE, FERRITIN, TIBC, IRON, RETICCTPCT in the last 72 hours.  Urine analysis:    Component Value Date/Time   COLORURINE YELLOW 09/06/2020 2206   APPEARANCEUR CLEAR  09/06/2020 2206   LABSPEC 1.015 09/06/2020 2206   PHURINE 6.0 09/06/2020 2206   GLUCOSEU NEGATIVE 09/06/2020 2206   HGBUR NEGATIVE 09/06/2020 2206   HGBUR negative 06/14/2010 1119   BILIRUBINUR NEGATIVE 09/06/2020 2206   BILIRUBINUR SMALL 01/04/2016 1634   KETONESUR 20 (A) 09/06/2020 2206   PROTEINUR NEGATIVE 09/06/2020 2206   UROBILINOGEN 2.0 (H) 02/05/2018 1825   NITRITE NEGATIVE 09/06/2020 2206   LEUKOCYTESUR NEGATIVE 09/06/2020 2206    Sepsis Labs: Lactic Acid, Venous    Component Value Date/Time   LATICACIDVEN 1.4 10/02/2020 2040    MICROBIOLOGY: Recent Results (from the past 240 hour(s))  Culture, blood (routine x 2)     Status: None (Preliminary result)   Collection Time: 10/03/20  3:43 AM   Specimen: BLOOD  Result Value  Ref Range Status   Specimen Description BLOOD LEFT HAND  Final   Special Requests   Final    BOTTLES DRAWN AEROBIC AND ANAEROBIC Blood Culture results may not be optimal due to an inadequate volume of blood received in culture bottles   Culture   Final    NO GROWTH 2 DAYS Performed at [facility], [address], [place], [place] [address]    Report Status PENDING  Incomplete  SARS CORONAVIRUS 2 (TAT 6-24 HRS) Nasopharyngeal Nasopharyngeal Swab     Status: None   Collection Time: 10/03/20  5:20 AM   Specimen: Nasopharyngeal Swab  Result Value Ref Range Status   SARS Coronavirus 2 NEGATIVE NEGATIVE Final    Comment: (NOTE) SARS-CoV-2 target nucleic acids are NOT DETECTED.  The SARS-CoV-2 RNA is generally detectable in upper and lower respiratory specimens during the acute phase of infection. Negative results do not preclude SARS-CoV-2 infection, do not rule out co-infections with other pathogens, and should not be used as the sole basis for treatment or other patient management decisions. Negative results must be combined with clinical observations, patient history, and epidemiological information. The expected result is  Negative.  Fact Sheet for Patients: HairSlick.no  Fact Sheet for Healthcare Providers: quierodirigir.com  This test is not yet approved or cleared by the [place] FDA and  has been authorized for detection and/or diagnosis of SARS-CoV-2 by FDA under an Emergency Use Authorization (EUA). This EUA will remain  in effect (meaning this test can be used) for the duration of the COVID-19 declaration under Se ction 564(b)(1) of the Act, 21 U.S.C. section 360bbb-3(b)(1), unless the authorization is terminated or revoked sooner.  Performed at [facility], [address], [place], [place] [address]   Culture, blood (routine x 2)     Status: None (Preliminary result)   Collection Time: 10/03/20  7:57 AM   Specimen: BLOOD RIGHT HAND  Result Value Ref Range Status   Specimen Description BLOOD RIGHT HAND  Final   Special Requests   Final    BOTTLES DRAWN AEROBIC AND ANAEROBIC Blood Culture adequate volume   Culture   Final    NO GROWTH 2 DAYS Performed at [facility], [address], [place], [place] [address]    Report Status PENDING  Incomplete    RADIOLOGY STUDIES/RESULTS: No results found.   LOS: 2 days   Jeoffrey Massed, MD  [facility]    To contact the attending provider between 7A-7P or the covering provider during after hours 7P-7A, please log into the web site [email or web address] and access using universal [place] password for that web site. If you do not have the password, please call the hospital operator.  10/05/2020, 12:17 PM

## 2020-10-06 LAB — CBC
HCT: 36.6 % — ABNORMAL LOW (ref 39.0–52.0)
Hemoglobin: 11.9 g/dL — ABNORMAL LOW (ref 13.0–17.0)
MCH: 25.4 pg — ABNORMAL LOW (ref 26.0–34.0)
MCHC: 32.5 g/dL (ref 30.0–36.0)
MCV: 78.2 fL — ABNORMAL LOW (ref 80.0–100.0)
Platelets: 287 10*3/uL (ref 150–400)
RBC: 4.68 MIL/uL (ref 4.22–5.81)
RDW: 17.4 % — ABNORMAL HIGH (ref 11.5–15.5)
WBC: 8.8 10*3/uL (ref 4.0–10.5)
nRBC: 0 % (ref 0.0–0.2)

## 2020-10-06 MED ORDER — SENNOSIDES-DOCUSATE SODIUM 8.6-50 MG PO TABS: 1.0000 | ORAL_TABLET | Freq: Every day | ORAL | 0 refills | Status: DC

## 2020-10-06 MED ORDER — POLYETHYLENE GLYCOL 3350 17 G PO PACK: 17.0000 g | PACK | Freq: Two times a day (BID) | ORAL | 0 refills | Status: DC

## 2020-10-06 MED ORDER — ENSURE ENLIVE PO LIQD: 237.0000 mL | Freq: Two times a day (BID) | ORAL | 0 refills | Status: AC

## 2020-10-06 MED ORDER — AMOXICILLIN-POT CLAVULANATE 875-125 MG PO TABS: 1.0000 | ORAL_TABLET | Freq: Two times a day (BID) | ORAL | 0 refills | Status: AC

## 2020-10-06 NOTE — TOC Initial Note (Signed)
Transition of Care [facility]) - Initial/Assessment Note    Patient Details  Name: [name] MRN: [id number] Date of Birth: 12/10/1960  [facility] CM/SW Contact:    Mearl Latin, LCSW Phone Number: 10/06/2020, 11:59 AM  Clinical Narrative:                 CSW spoke with patient's sister and made her aware of patient's discharge back to his group home today. She reports appreciation for the call.  CSW received return call from [facility] [employer], [name] [phone]. She requested FL2 and DC Summary be sent with patient (no need to fax) and new prescriptions sent to [facility]. She also requested [facility] for transport.   Expected Discharge Plan: Group Home Barriers to Discharge: No Barriers Identified   Patient Goals and CMS Choice Patient states their goals for this hospitalization and ongoing recovery are:: Return home CMS Medicare.gov Compare Post Acute Care list provided to:: Patient Represenative (must comment) (Sister) Choice offered to / list presented to : Sibling  Expected Discharge Plan and Services Expected Discharge Plan: Group Home In-house Referral: Clinical Social Work     Living arrangements for the past 2 months: Group Home Expected Discharge Date: 10/06/20                                    Prior Living Arrangements/Services Living arrangements for the past 2 months: Group Home Lives with:: Facility Resident Patient language and need for interpreter reviewed:: Yes Do you feel safe going back to the place where you live?: Yes      Need for Family Participation in Patient Care: Yes (Comment) Care giver support system in place?: Yes (comment) Current home services: DME Criminal Activity/Legal Involvement Pertinent to Current Situation/Hospitalization: No - Comment as needed  Activities of Daily Living Home Assistive Devices/Equipment: Wheelchair ADL Screening (condition at time of admission) Patient's cognitive ability  adequate to safely complete daily activities?: No Is the patient deaf or have difficulty hearing?: No Does the patient have difficulty seeing, even when wearing glasses/contacts?: No Does the patient have difficulty concentrating, remembering, or making decisions?: Yes Patient able to express need for assistance with ADLs?: Yes Does the patient have difficulty dressing or bathing?: Yes Independently performs ADLs?: No Communication: Independent Dressing (OT): Needs assistance Is this a change from baseline?: Pre-admission baseline Grooming: Dependent Is this a change from baseline?: Pre-admission baseline Feeding: Dependent Is this a change from baseline?: Pre-admission baseline Bathing: Dependent Is this a change from baseline?: Pre-admission baseline Toileting: Dependent Is this a change from baseline?: Pre-admission baseline In/Out Bed: Dependent Is this a change from baseline?: Pre-admission baseline Walks in Home: Dependent Is this a change from baseline?: Pre-admission baseline Does the patient have difficulty walking or climbing stairs?: Yes Weakness of Legs: Both Weakness of Arms/Hands: Both  Permission Sought/Granted Permission sought to share information with : Facility Contractor granted to share information with : Yes, Verbal Permission Granted  Share Information with NAME: [name]  Permission granted to share info w AGENCY: [facility]  Permission granted to share info w Relationship: Sister  Permission granted to share info w Contact Information: [phone]  Emotional Assessment Appearance:: Appears older than stated age Attitude/Demeanor/Rapport: Unable to Assess Affect (typically observed): Unable to Assess Orientation: : Oriented to Self,Oriented to Place Alcohol / Substance Use: Not Applicable Psych Involvement: No (comment)  Admission  diagnosis:  Recurrent right pleural effusion [J90] Chest pain  [R07.9] Recurrent pleural effusion [J90] Aspiration pneumonia (HCC) [J69.0] Patient Active Problem List   Diagnosis Date Noted  . Right pulmonary infiltrate on CXR 10/03/2020  . Dysphagia 10/03/2020  . GERD without esophagitis 10/03/2020  . Aspiration pneumonia (HCC) 10/03/2020  . Recurrent right pleural effusion 10/02/2020  . Abnormal CT scan, colon   . 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 Nausea and vomiting 07/31/2020  . 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 Osteomyelitis (HCC) 10/08/2019  . Acute osteomyelitis of right ankle or foot (HCC)   . Ulcerated, foot, right, with necrosis of bone (HCC)   . Pseudomonas aeruginosa infection 09/06/2019  . MRSA infection 09/06/2019  . Lactic acidosis   . Stage 3 skin ulcer of sacral region (HCC) 08/10/2018  . Gastric wall thickening 08/10/2018  . Alkaline phosphatase elevation 08/10/2018  . Cholelithiasis 08/10/2018  . Microcytic anemia   . Hiatal hernia 04/07/2018  . 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/15/2015  . Cerebral palsy, quadriplegic (HCC)   . Pre-diabetes 08/31/2015  . Foot ulcer, limited to breakdown of skin (HCC) 06/08/2015  . Seasonal allergies 11/01/2014  . Abdominal pain   . Pressure ulcer of foot 03/14/2014  . CAP (community acquired pneumonia) 03/13/2014  . Decubitus ulcer of left hip, stage 3 (HCC) 01/08/2013  . URINARY INCONTINENCE 02/09/2010  . Mental retardation 07/24/2006  . Infantile cerebral palsy (HCC) 07/24/2006   PCP:  Har[place], MD Pharmacy:   [facility] [place], [place] - [address] [place] [address] Phone: [phone] Fax: [phone]     Social Determinants of Health (SDOH) Interventions    Readmission Risk Interventions Readmission Risk Prevention Plan 08/03/2020  Post Dischage Appt Complete  Medication Screening Complete  Some recent data might be hidden

## 2020-10-06 NOTE — Care Management Important Message (Signed)
Important Message  Patient Details  Name: [name] MRN: [id number] Date of Birth: 06/28/1960   Medicare Important Message Given:  Yes - Important Message mailed due to current National Emergency  Verbal consent obtained due to current National Emergency  Relationship to patient: Guardian Contact Name: Dianne Call Date: 10/06/20  Time: 1142 Phone: [phone] Outcome: Spoke with contact Important Message mailed to: Patient address on file    Orson Aloe 10/06/2020, 11:42 AM

## 2020-10-06 NOTE — Progress Notes (Signed)
[name] DC to group home per MD order. An After Visit Summary was printed and given to [facility] transport staff.  Patient escorted via stretcher, and D/C to group home via ambulance. Patient belongings and valuables sent with patient.   Report called to Dianne at the famility. Marylu Lund  10/06/2020 6:39 PM

## 2020-10-06 NOTE — [place] FL2 (Signed)
[place] MEDICAID FL2 LEVEL OF CARE SCREENING TOOL     IDENTIFICATION  Patient Name: [name] Birthdate: [birth year] Sex: male Admission Date (Current Location): 10/02/2020  [facility] and [place] Number:  Producer, television/film/video and Address:  [facility], [address] [place], [place] [address]      Provider Number: [id number]  Attending Physician Name and Address:  Maretta Bees, MD  Relative Name and Phone Number:  [name], sister, [phone]    Current Level of Care: Hospital Recommended Level of Care: [facility] Prior Approval Number:    Date Approved/Denied:   PASRR Number:    Discharge Plan: Other (Comment) (Group home)    Current Diagnoses: Patient Active Problem List   Diagnosis Date Noted  . Right pulmonary infiltrate on CXR 10/03/2020  . Dysphagia 10/03/2020  . GERD without esophagitis 10/03/2020  . Aspiration pneumonia (HCC) 10/03/2020  . Recurrent right pleural effusion 10/02/2020  . Abnormal CT scan, colon   . 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 Nausea and vomiting 07/31/2020  . 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 Osteomyelitis (HCC) 10/08/2019  . Acute osteomyelitis of right ankle or foot (HCC)   . Ulcerated, foot, right, with necrosis of bone (HCC)   . Pseudomonas aeruginosa infection 09/06/2019  . MRSA infection 09/06/2019  . Lactic acidosis   . Stage 3 skin ulcer of sacral region (HCC) 08/10/2018  . Gastric wall thickening 08/10/2018  . Alkaline phosphatase elevation 08/10/2018  . Cholelithiasis 08/10/2018  . Microcytic anemia   . Hiatal hernia 04/07/2018  . 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/15/2015  . Cerebral palsy, quadriplegic (HCC)   . Pre-diabetes 08/31/2015  . Foot ulcer, limited to breakdown of skin (HCC) 06/08/2015  . Seasonal allergies 11/01/2014  . Abdominal pain   . Pressure ulcer of foot  03/14/2014  . CAP (community acquired pneumonia) 03/13/2014  . Decubitus ulcer of left hip, stage 3 (HCC) 01/08/2013  . URINARY INCONTINENCE 02/09/2010  . Mental retardation 07/24/2006  . Infantile cerebral palsy (HCC) 07/24/2006    Orientation RESPIRATION BLADDER Height & Weight     Self,Situation,Place  Normal Incontinent,External catheter Weight:   Height:     BEHAVIORAL SYMPTOMS/MOOD NEUROLOGICAL BOWEL NUTRITION STATUS      Incontinent Diet (Soft diet with thin liquids)  AMBULATORY STATUS COMMUNICATION OF NEEDS Skin   Extensive Assist Verbally PU Stage and Appropriate Care (Stage III on foot with foam dressing change prn)                       Personal Care Assistance Level of Assistance  Bathing,Feeding,Dressing Bathing Assistance: Maximum assistance Feeding assistance: Maximum assistance Dressing Assistance: Maximum assistance     Functional Limitations Info  Sight,Hearing,Speech Sight Info: Adequate Hearing Info: Adequate Speech Info: Adequate    SPECIAL CARE FACTORS FREQUENCY                       Contractures Contractures Info: Not present    Additional Factors Info  Code Status,Allergies Code Status Info: DNR Allergies Info: Adhesive           Current Medications (10/06/2020):   Discharge Medications:       STOP taking these medications       LORazepam 2 MG tablet Commonly known as: ATIVAN   saccharomyces boulardii 250 MG capsule Commonly known  as: FLORASTOR             TAKE these medications       acetaminophen 500 MG tablet Commonly known as: TYLENOL Take 500 mg by mouth every 6 (six) hours as needed (for aches, pains, and/or fevers).   albuterol 108 (90 Base) MCG/ACT inhaler Commonly known as: VENTOLIN HFA Inhale 2 puffs into the lungs every 4 (four) hours as needed for shortness of breath or wheezing.   alum & mag hydroxide-simeth 200-200-20 MG/5ML suspension Commonly known as: MAALOX/MYLANTA Take 15-30 mLs by  mouth every 6 (six) hours as needed for indigestion or heartburn (or nausea).   amoxicillin-clavulanate 875-125 MG tablet Commonly known as: Augmentin Take 1 tablet by mouth 2 (two) times daily for 2 days.   ascorbic acid 250 MG tablet Commonly known as: VITAMIN C Take 1 tablet (250 mg total) by mouth daily.   baclofen 20 MG tablet Commonly known as: LIORESAL Take 1 tablet (20 mg total) by mouth 3 (three) times daily.   bisacodyl 10 MG suppository Commonly known as: DULCOLAX Place 1 suppository (10 mg total) rectally daily as needed for moderate constipation. What changed:   when to take this  additional instructions   brompheniramine-pseudoephedrine 1-15 MG/5ML Elix Commonly known as: DIMETAPP Take 10 mLs by mouth every 8 (eight) hours as needed (for cold symptoms).   cetirizine 10 MG tablet Commonly known as: ZYRTEC Take 1 tablet (10 mg total) by mouth daily. What changed: when to take this   clotrimazole 1 % cream Commonly known as: LOTRIMIN Apply 1 application topically 2 (two) times daily.   divalproex 125 MG capsule Commonly known as: DEPAKOTE SPRINKLE Take 500 mg by mouth 2 (two) times daily.   feeding supplement Liqd Take 237 mLs by mouth 2 (two) times daily between meals.   Ferrex 150 150 MG capsule Generic drug: iron polysaccharides Take 150 mg by mouth daily at 12 noon. What changed: Another medication with the same name was removed. Continue taking this medication, and follow the directions you see here.   fluticasone 50 MCG/ACT nasal spray Commonly known as: FLONASE Place 1 spray into both nostrils daily.   guaiFENesin-dextromethorphan 100-10 MG/5ML syrup Commonly known as: ROBITUSSIN DM Take 10 mLs by mouth every 4 (four) hours as needed for cough.   hydrocortisone cream 1 % APPLY TOPICALLY AT BEDTIME AS NEEDED FOR ITCHING What changed: See the new instructions.   ketoconazole 2 % shampoo Commonly known as: NIZORAL APPLY  TOPICALLY TO AFFECTED AREA(S) TWICE WEEKLY AS DIRECTED What changed: See the new instructions.   mupirocin ointment 2 % Commonly known as: BACTROBAN Apply 1 application topically daily as needed (wound care).   omeprazole 40 MG capsule Commonly known as: PRILOSEC Take 1 capsule (40 mg total) by mouth 2 (two) times daily.   ondansetron 4 MG tablet Commonly known as: ZOFRAN Take 1 tablet (4 mg total) by mouth every 6 (six) hours as needed for nausea.   oyster calcium 500 MG Tabs tablet Take 500 mg of elemental calcium by mouth daily.   polyethylene glycol 17 g packet Commonly known as: MiraLax Take 17 g by mouth 2 (two) times daily. What changed:   when to take this  reasons to take this   Preparation H Rapid Relief 5-0.25-14.4-15 % Crea Apply 1 application topically See admin instructions. Apply a thin layer topically to the affected area 2 times a day for residual hemorrhoidal skin tag   Prevalon Heel Protector Misc 1 Device by  Does not apply route daily.   risperiDONE 2 MG tablet Commonly known as: RISPERDAL Take 2 mg by mouth at bedtime.   senna-docusate 8.6-50 MG tablet Commonly known as: Senokot-S Take 1 tablet by mouth at bedtime.   tamsulosin 0.4 MG Caps capsule Commonly known as: FLOMAX TAKE 1 CAPSULE BY MOUTH EVERY DAY *DO NOT CRUSH* What changed: See the new instructions.   tolterodine 2 MG 24 hr capsule Commonly known as: Detrol LA Take 1 capsule (2 mg total) by mouth daily.   tretinoin 0.025 % gel Commonly known as: RETIN-A Apply 1 application topically See admin instructions. Apply to the affected areas of the face at bedtime   triamcinolone cream 0.1 % Commonly known as: KENALOG Apply 1 application topically 3 (three) times daily. Apply cream to left ankle three time daily   TRIPLE ANTIBIOTIC EX Apply 1 application topically daily as needed (cuts/scrapes/scratches).   Vitamin D3 125 MCG (5000 UT) Caps Take 5,000 Units by mouth  daily.   Voltaren 1 % Gel Generic drug: diclofenac Sodium APPLY 4 GRAMS TOPICALLY TO AFFECTED AREA(S) FOUR TIMES DAILY What changed: See the new instructions.   Zinc Oxide 40 % Pste Apply 1 application topically 2 (two) times daily as needed (unspecified). Desitin Max strength paste apply thin layer topically to buttocks and scrotum What changed:   reasons to take this  additional instructions     Relevant Imaging Results:  Relevant Lab Results:   Additional Information SSN: [id number]  Mearl Latin, [place]

## 2020-10-06 NOTE — TOC Transition Note (Signed)
Transition of Care [facility]) - CM/SW Discharge Note   Patient Details  Name: [name] MRN: [id number] Date of Birth: 07-13-1960  [facility] of [facility]) CM/SW Contact:  Mearl Latin, LCSW Phone Number: 10/06/2020, 12:26 PM   Clinical Narrative:    Patient will DC to: [facility] [phone]) Anticipated DC date: 10/06/20 Family notified: Sister, [name] Transport by: Sharin Mons   Per MD patient ready for DC to Group Home. RN to call report prior to discharge [name] ([phone]). RN, patient, patient's family, and facility notified of DC. Discharge Summary and FL2 sent to facility in packet. DC packet on chart. Ambulance transport requested for patient.   CSW will sign off for now as social work intervention is no longer needed. Please consult [place] again if new needs arise.      Final next level of care: Group Home Barriers to Discharge: No Barriers Identified   Patient Goals and CMS Choice Patient states their goals for this hospitalization and ongoing recovery are:: Return home CMS Medicare.gov Compare Post Acute Care list provided to:: Patient Represenative (must comment) (Sister) Choice offered to / list presented to : Sibling  Discharge Placement              Patient chooses bed at: Other - please specify in the comment section below: [facility]) Patient to be transferred to facility by: [facility] Name of family member notified: Sister Patient and family notified of of transfer: 10/06/20  Discharge Plan and Services In-house Referral: Clinical Social Work                                   Social Determinants of Health (SDOH) Interventions     Readmission Risk Interventions Readmission Risk Prevention Plan 08/03/2020  Post Dischage Appt Complete  Medication Screening Complete  Some recent data might be hidden

## 2020-10-06 NOTE — Progress Notes (Signed)
CSW left voicemail for patient's group home Director regarding potential discharge.   Maren Wiesen LCSW

## 2020-10-06 NOTE — Discharge Summary (Signed)
PATIENT DETAILS Name: [name] Age: 60 y.o. Sex: male Date of Birth: 07-28-1960 MRN: [id number]. Admitting Physician: Marinda Elk, MD JXB:JYNWGN, [name], MD  Admit Date: 10/02/2020 Discharge date: 10/06/2020  Recommendations for Outpatient Follow-up:  1. Follow up with PCP in 1-2 weeks 2. Please obtain CMP/CBC in one week 3. Consider starting palliative care discussion at group home-at risk for aspiration pneumonia. 4. Please follow blood cultures until final.  Admitted From:  Group home  Disposition: Group home   Home Health: No  Equipment/Devices: None  Discharge Condition: Stable  CODE STATUS: DNR.  Diet recommendation:  Diet Order            Diet general           DIET DYS 2 Room service appropriate? No; Fluid consistency: Thin  Diet effective now                  Brief Narrative: Patient is a 60 y.o. male cerebral palsy, intellectual disability, functional quadriplegia, bipolar disorder, recent right BKA, constipation, multiple decubitus ulcers-presented with cough, shortness of breath-found to have likely aspiration pneumonia and subsequently admitted to the hospitalist service.  Significant events: 5/11>> presenting with shortness of breath-felt to be aspiration pneumonia.  Significant studies: 5/9>> chest x-ray: Right basilar atelectasis/infiltrate (rotated phlegm).  Antimicrobial therapy: Unasyn: 5/9>>5/13  Microbiology data: 5/10 >>blood culture: No growth 5/12>> blood culture: Pending  Procedures : None  Consults: None  Brief Hospital Course: Aspiration pneumonia: Overall better-some cough continues-did have some mild weight fever over the past few days but clinically appears stable-maintained on Unasyn-will be transition to Augmentin on discharge.  Evaluated by SLP-on a dysphagia 2 diet which he seems to tolerate.  Given that he is at risk for recurrent episodes of aspiration pneumonitis-strongly recommend the  patient be followed by palliative care while at group home.  Dysphagia: In the setting of cerebral palsy-tolerating dysphagia 2 diet-SLP following.  Consider follow-up by SLP while at group home.  GERD: Continue PPI  Functional quadriplegia: Bedbound at baseline.  Constipation: Maintained on bowel regimen of MiraLAX/senna.  History of right BKA  Stage III pressure ulcer in the sacral area: Appreciate wound care evaluation.  History of cerebral palsy: Continue baclofen and risperidone.  Discharge Diagnoses:  Principal Problem:   Recurrent right pleural effusion Active Problems:   Decubitus ulcer of left hip, stage 3 (HCC)   Functional quadriplegia (HCC)   Stage 3 skin ulcer of sacral region [facility])   Right pulmonary infiltrate on CXR   Dysphagia   GERD without esophagitis   Aspiration pneumonia [facility])   Discharge Instructions:  Activity:  As tolerated with Full fall precautions use walker/cane & assistance as needed   Discharge Instructions    Call MD for:  difficulty breathing, headache or visual disturbances   Complete by: As directed    Call MD for:  redness, tenderness, or signs of infection (pain, swelling, redness, odor or green/yellow discharge around incision site)   Complete by: As directed    Diet general   Complete by: As directed    Discharge instructions   Complete by: As directed    Follow with Primary MD  [place], MD in 1-2 weeks  Please get a complete blood count and chemistry panel checked by your Primary MD at your next visit, and again as instructed by your Primary MD.  Get Medicines reviewed and adjusted: Please take all your medications with you for your next visit with your  Primary MD  Laboratory/radiological data: Please request your Primary MD to go over all hospital tests and procedure/radiological results at the follow up, please ask your Primary MD to get all Hospital records sent to his/her office.  In some cases, they  will be blood work, cultures and biopsy results pending at the time of your discharge. Please request that your primary care M.D. follows up on these results.  Also Note the following: If you experience worsening of your admission symptoms, develop shortness of breath, life threatening emergency, suicidal or homicidal thoughts you must seek medical attention immediately by calling 911 or calling your MD immediately  if symptoms less severe.  You must read complete instructions/literature along with all the possible adverse reactions/side effects for all the Medicines you take and that have been prescribed to you. Take any new Medicines after you have completely understood and accpet all the possible adverse reactions/side effects.   Do not drive when taking Pain medications or sleeping medications (Benzodaizepines)  Do not take more than prescribed Pain, Sleep and Anxiety Medications. It is not advisable to combine anxiety,sleep and pain medications without talking with your primary care practitioner  Special Instructions: If you have smoked or chewed Tobacco  in the last 2 yrs please stop smoking, stop any regular Alcohol  and or any Recreational drug use.  Wear Seat belts while driving.  Please note: You were cared for by a hospitalist during your hospital stay. Once you are discharged, your primary care physician will handle any further medical issues. Please note that NO REFILLS for any discharge medications will be authorized once you are discharged, as it is imperative that you return to your primary care physician (or establish a relationship with a primary care physician if you do not have one) for your post hospital discharge needs so that they can reassess your need for medications and monitor your lab values.   Discharge wound care:   Complete by: As directed    Gently cleanse coccyx and left trochanter (left hip) wound with soap and water, pat dry. Place foam dressings over areas.    Increase activity slowly   Complete by: As directed      Allergies as of 10/06/2020      Reactions   Adhesive [tape] Rash      Medication List    STOP taking these medications   LORazepam 2 MG tablet Commonly known as: ATIVAN   saccharomyces boulardii 250 MG capsule Commonly known as: FLORASTOR     TAKE these medications   acetaminophen 500 MG tablet Commonly known as: TYLENOL Take 500 mg by mouth every 6 (six) hours as needed (for aches, pains, and/or fevers).   albuterol 108 (90 Base) MCG/ACT inhaler Commonly known as: VENTOLIN HFA Inhale 2 puffs into the lungs every 4 (four) hours as needed for shortness of breath or wheezing.   alum & mag hydroxide-simeth 200-200-20 MG/5ML suspension Commonly known as: MAALOX/MYLANTA Take 15-30 mLs by mouth every 6 (six) hours as needed for indigestion or heartburn (or nausea).   amoxicillin-clavulanate 875-125 MG tablet Commonly known as: Augmentin Take 1 tablet by mouth 2 (two) times daily for 2 days.   ascorbic acid 250 MG tablet Commonly known as: VITAMIN C Take 1 tablet (250 mg total) by mouth daily.   baclofen 20 MG tablet Commonly known as: LIORESAL Take 1 tablet (20 mg total) by mouth 3 (three) times daily.   bisacodyl 10 MG suppository Commonly known as: DULCOLAX Place 1 suppository (10  mg total) rectally daily as needed for moderate constipation. What changed:   when to take this  additional instructions   brompheniramine-pseudoephedrine 1-15 MG/5ML Elix Commonly known as: DIMETAPP Take 10 mLs by mouth every 8 (eight) hours as needed (for cold symptoms).   cetirizine 10 MG tablet Commonly known as: ZYRTEC Take 1 tablet (10 mg total) by mouth daily. What changed: when to take this   clotrimazole 1 % cream Commonly known as: LOTRIMIN Apply 1 application topically 2 (two) times daily.   divalproex 125 MG capsule Commonly known as: DEPAKOTE SPRINKLE Take 500 mg by mouth 2 (two) times daily.   feeding  supplement Liqd Take 237 mLs by mouth 2 (two) times daily between meals.   Ferrex 150 150 MG capsule Generic drug: iron polysaccharides Take 150 mg by mouth daily at 12 noon. What changed: Another medication with the same name was removed. Continue taking this medication, and follow the directions you see here.   fluticasone 50 MCG/ACT nasal spray Commonly known as: FLONASE Place 1 spray into both nostrils daily.   guaiFENesin-dextromethorphan 100-10 MG/5ML syrup Commonly known as: ROBITUSSIN DM Take 10 mLs by mouth every 4 (four) hours as needed for cough.   hydrocortisone cream 1 % APPLY TOPICALLY AT BEDTIME AS NEEDED FOR ITCHING What changed: See the new instructions.   ketoconazole 2 % shampoo Commonly known as: NIZORAL APPLY TOPICALLY TO AFFECTED AREA(S) TWICE WEEKLY AS DIRECTED What changed: See the new instructions.   mupirocin ointment 2 % Commonly known as: BACTROBAN Apply 1 application topically daily as needed (wound care).   omeprazole 40 MG capsule Commonly known as: PRILOSEC Take 1 capsule (40 mg total) by mouth 2 (two) times daily.   ondansetron 4 MG tablet Commonly known as: ZOFRAN Take 1 tablet (4 mg total) by mouth every 6 (six) hours as needed for nausea.   oyster calcium 500 MG Tabs tablet Take 500 mg of elemental calcium by mouth daily.   polyethylene glycol 17 g packet Commonly known as: MiraLax Take 17 g by mouth 2 (two) times daily. What changed:   when to take this  reasons to take this   Preparation H Rapid Relief 5-0.25-14.4-15 % Crea Apply 1 application topically See admin instructions. Apply a thin layer topically to the affected area 2 times a day for residual hemorrhoidal skin tag   Prevalon Heel Protector Misc 1 Device by Does not apply route daily.   risperiDONE 2 MG tablet Commonly known as: RISPERDAL Take 2 mg by mouth at bedtime.   senna-docusate 8.6-50 MG tablet Commonly known as: Senokot-S Take 1 tablet by mouth at  bedtime.   tamsulosin 0.4 MG Caps capsule Commonly known as: FLOMAX TAKE 1 CAPSULE BY MOUTH EVERY DAY *DO NOT CRUSH* What changed: See the new instructions.   tolterodine 2 MG 24 hr capsule Commonly known as: Detrol LA Take 1 capsule (2 mg total) by mouth daily.   tretinoin 0.025 % gel Commonly known as: RETIN-A Apply 1 application topically See admin instructions. Apply to the affected areas of the face at bedtime   triamcinolone cream 0.1 % Commonly known as: KENALOG Apply 1 application topically 3 (three) times daily. Apply cream to left ankle three time daily   TRIPLE ANTIBIOTIC EX Apply 1 application topically daily as needed (cuts/scrapes/scratches).   Vitamin D3 125 MCG (5000 UT) Caps Take 5,000 Units by mouth daily.   Voltaren 1 % Gel Generic drug: diclofenac Sodium APPLY 4 GRAMS TOPICALLY TO AFFECTED AREA(S) FOUR  TIMES DAILY What changed: See the new instructions.   Zinc Oxide 40 % Pste Apply 1 application topically 2 (two) times daily as needed (unspecified). Desitin Max strength paste apply thin layer topically to buttocks and scrotum What changed:   reasons to take this  additional instructions            Discharge Care Instructions  (From admission, onward)         Start     Ordered   10/06/20 0000  Discharge wound care:       Comments: Gently cleanse coccyx and left trochanter (left hip) wound with soap and water, pat dry. Place foam dressings over areas.   10/06/20 1006          Follow-up Information    Bakare, Mobolaji B, MD. Schedule an appointment as soon as possible for a visit in 1 week(s).   Specialty: Internal Medicine Contact information: [address] Raeanne Gathers [place] [address] [phone]              Allergies  Allergen Reactions  . Adhesive [Tape] Rash      Consultations:   None   Other Procedures/Studies: DG Chest 1 View  Result Date: 10/02/2020 CLINICAL DATA:  Cough. EXAM: CHEST  1 VIEW  COMPARISON:  September 06, 2020 FINDINGS: The study is limited secondary to patient rotation. Mild atelectasis and/or infiltrate is seen within the right lung base. The left lung is clear. There is no evidence of a pleural effusion or pneumothorax. Stable cardiomegaly is seen. Radiopaque surgical clips are seen overlying the expected region of the esophageal hiatus. Degenerative changes seen throughout the thoracic spine. IMPRESSION: Limited study, as described above, with mild right basilar atelectasis and/or infiltrate. Electronically Signed   By: Aram Candela M.D.   On: 10/02/2020 18:57   DG Chest 1 View  Result Date: 09/06/2020 CLINICAL DATA:  Status post thoracentesis EXAM: CHEST  1 VIEW COMPARISON:  September 06, 2020 study obtained earlier in the day FINDINGS: No pneumothorax. Near complete resolution of right pleural effusion compared to earlier in the day. There is ill-defined airspace opacity in the medial left base. No new opacity evident. Stable cardiomegaly. No adenopathy. No bone lesions. IMPRESSION: No pneumothorax. Near complete resolution of right pleural effusion. Airspace opacity medial left base. No new opacity evident. Stable cardiomegaly. Electronically Signed   By: Bretta Bang III M.D.   On: 09/06/2020 11:51   DG Swallowing Func-Speech Pathology  Result Date: 09/07/2020 Objective Swallowing Evaluation: Type of Study: MBS-Modified Barium Swallow Study  Patient Details Name: JAXTIN RAIMONDO MRN: [id number] Date of Birth: [birth year] Today's Date: 09/07/2020 Time: SLP Start Time (ACUTE ONLY): 0840 -SLP Stop Time (ACUTE ONLY): 0915 SLP Time Calculation (min) (ACUTE ONLY): 35 min Past Medical History: Past Medical History: Diagnosis Date . Anemia   microcytic anemia  . Anxiety   takes Ativan daily as needed . Bipolar disorder (HCC)   takes Depakote daily . CEREBRAL PALSY 07/24/2006 . Chronic bronchitis (HCC)   "gets it q yr" . Chronic bronchitis (HCC)  . Chronic diabetic ulcer of foot  determined by examination (HCC)  . Constipation   takes Miralax daily as needed . Depression   takes Risperdal nightly . Environmental allergies   takes Zyrtec daily . GASTRIC ULCER ACUTE WITH HEMORRHAGE 07/24/2006 . Gastric ulcer with hemorrhage   hx of  . GERD (gastroesophageal reflux disease)   takes Pravacid daily . History of blood transfusion   no abnormal reaction noted .  History of hiatal hernia  . History of shingles  . History of stomach ulcers ~ 1996 . Hypercholesterolemia  . Intellectual functioning disability  . Intermittent explosive disorder  . Internal hemorrhoids  . MRSA infection 09/06/2019 . Obsessive compulsive disorder  . Osteomyelitis (HCC)   right foot  . Pneumonia "quite a few times"  in 2015 and in June 2016 . Pressure ulcer of sacral region   hx of  . Pseudomonas aeruginosa infection 09/06/2019 . Psychotic disorder (HCC)  . Recurrent UTI (urinary tract infection)  . Seborrheic dermatitis  . Sigmoid diverticulosis  . Spastic tetraplegia (HCC)  . URINARY INCONTINENCE 02/09/2010  takes Detrol daily-occasionally incontinence . Vitamin D deficiency  . Weakness   numbness in extremities Past Surgical History: Past Surgical History: Procedure Laterality Date . AMPUTATION Right 12/14/2014  Procedure: AMPUTATION DIGIT RIGHT GREAT TOE MTP;  Surgeon: Nadara MustardMarcus Duda V, MD;  Location: [facility];  Service: Orthopedics;  Laterality: Right; . AMPUTATION Right 10/08/2019  Procedure: RIGHT FIFTH RAY AMPUTATION, ADJACENT TISSUE TRANSFER;  Surgeon: Park LiterPrice, Michael J, DPM;  Location: [facility];  Service: Podiatry;  Laterality: Right; . AMPUTATION Right 07/27/2020  Procedure: AMPUTATION BELOW KNEE;  Surgeon: Toni ArthursHewitt, John, MD;  Location: [facility] OR;  Service: Orthopedics;  Laterality: Right;  60min . BIOPSY  08/12/2018  Procedure: BIOPSY;  Surgeon: Lynann BolognaGupta, Rajesh, MD;  [facility];  Service: Gastroenterology;; . COLONOSCOPY WITH PROPOFOL N/A 08/14/2018  Procedure: COLONOSCOPY WITH PROPOFOL;  Surgeon: Lynann BolognaGupta, Rajesh, MD;   Location: [facility];  Service: Gastroenterology;  Laterality: N/A; . ESOPHAGOGASTRODUODENOSCOPY (EGD) WITH PROPOFOL N/A 08/12/2018  Procedure: ESOPHAGOGASTRODUODENOSCOPY (EGD) WITH PROPOFOL;  Surgeon: Lynann BolognaGupta, Rajesh, MD;  [facility]: [facility];  Service: Gastroenterology;  Laterality: N/A; . GRAFT APPLICATION Right 11/12/2019  Procedure: SKIN GRAFT SUBSTITUTE APPLICATION;  Surgeon: Park LiterPrice, Michael J, DPM;  Location: [facility];  Service: Podiatry;  Laterality: Right; . HERNIA REPAIR   . HIATAL HERNIA REPAIR  1982 . LEG SURGERY Bilateral ~ 1967  "legs were scissored; stretched them out" . WOUND DEBRIDEMENT Right 11/12/2019  Procedure: DEBRIDEMENT WOUND;  Surgeon: Park LiterPrice, Michael J, DPM;  Location: [facility];  Service: Podiatry;  Laterality: Right; HPI: Ulyses AmorHumble is a 60 yo male with PMH cerebral palsy, IDD, microcytic anemia, anxiety, bipolar disorder, decubitus ulcers, 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 Patient resides in a group home.  He was recently hospitalized 3/7 - 3/10 for right sided PNA discharged with abx to complete course. 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   He presents back to the hospital from his group home due to worsening complaints of cough, coarse breath sounds, abdominal pain, nausea.  Pt is quadriplegic continue risperdal, ativan, depakote  - resume baclofen when able  - resides in a group home; wheelchair and bedbound.  CT angio of chest showed moderate to marked severity of right upper lobe, right middle lobe and right lower lobe airspace disease with large partially loculated right pleural effusion and a large hiatal hernia  CT abdomen pelvis showed marked severity proctitis with possible underlying neoplastic process, mildly prominent small bowel loops, large gastric hernia.  Enlarged prostate.  Pt reports he coughs on occasion with food more than drink and his sister states he is on a soft/thin diet at his group home.  Subjective: pt awake in chair Assessment / Plan / Recommendation CHL IP CLINICAL  IMPRESSIONS 09/07/2020 Clinical Impression Patient presents with mild oropharyngeal dysphagia mostly characterized by decreased oral coordination/transiitng and delayed pharyngeal swallow with all barium boluses accumulating at  pyriform sinus.   No aspiration and flash penetration cleared with further swallows.  Pharyngeal swallow is overall strong without retention.  Pt's largest aspiration risk is due to his decreased management of secretions and delay in swallow.  Ovie must be fully upright for po intake due to delay in swallowing and SLP used reverse trendelenburg for optimal positionining.  No cough produced during testing however suspect when pt is coughing- it is likely due to airway inflitration.  Tablet taken with pudding easily transited into esophagus - and SLP advises pt to continue medication administration in this manner.  Recommend brief follow up at SNF for SLP to address improving pt's timing of swallow - initiating more efficiently for improved airway protection.  In addition, recommend pt have oral care BEFORE and AFTER po to clear copious secretions retained in oral cavity.  Aspiration pna and aspiration risk is chronic due to pt's dysphagia from his CP and his quadriparesis increases his pna risk when he aspirates. SLP Visit Diagnosis Dysphagia, oropharyngeal phase (R13.12) Attention and concentration deficit following -- Frontal lobe and executive function deficit following -- Impact on safety and function Moderate aspiration risk   CHL IP TREATMENT RECOMMENDATION 09/07/2020 Treatment Recommendations Therapy as outlined in treatment plan below   Prognosis 09/07/2020 Prognosis for Safe Diet Advancement Fair Barriers to Reach Goals -- Barriers/Prognosis Comment -- CHL IP DIET RECOMMENDATION 09/07/2020 SLP Diet Recommendations Dysphagia 3 (Mech soft) solids;Thin liquid Liquid Administration via Straw Medication Administration Whole meds with puree Compensations Slow rate;Small sips/bites Postural  Changes Remain semi-upright after after feeds/meals (Comment);Seated upright at 90 degrees   CHL IP OTHER RECOMMENDATIONS 09/07/2020 Recommended Consults -- Oral Care Recommendations Oral care before and after PO Other Recommendations --   CHL IP FOLLOW UP RECOMMENDATIONS 09/07/2020 Follow up Recommendations Home health SLP x2's week x 4 weeks   CHL IP FREQUENCY AND DURATION 09/07/2020 Speech Therapy Frequency (ACUTE ONLY) min 2x/week Treatment Duration 2 weeks      CHL IP ORAL PHASE 09/07/2020 Oral Phase Impaired Oral - Pudding Teaspoon -- Oral - Pudding Cup -- Oral - Honey Teaspoon -- Oral - Honey Cup -- Oral - Nectar Teaspoon -- Oral - Nectar Cup -- Oral - Nectar Straw Premature spillage Oral - Thin Teaspoon -- Oral - Thin Cup -- Oral - Thin Straw Premature spillage Oral - Puree Premature spillage Oral - Mech Soft Premature spillage;Lingual/palatal residue Oral - Regular -- Oral - Multi-Consistency -- Oral - Pill Premature spillage Oral Phase - Comment --  CHL IP PHARYNGEAL PHASE 09/07/2020 Pharyngeal Phase Impaired Pharyngeal- Pudding Teaspoon -- Pharyngeal -- Pharyngeal- Pudding Cup -- Pharyngeal -- Pharyngeal- Honey Teaspoon -- Pharyngeal -- Pharyngeal- Honey Cup -- Pharyngeal -- Pharyngeal- Nectar Teaspoon -- Pharyngeal -- Pharyngeal- Nectar Cup -- Pharyngeal -- Pharyngeal- Nectar Straw Delayed swallow initiation-pyriform sinuses Pharyngeal -- Pharyngeal- Thin Teaspoon -- Pharyngeal -- Pharyngeal- Thin Cup -- Pharyngeal -- Pharyngeal- Thin Straw Delayed swallow initiation-pyriform sinuses;Penetration/Aspiration during swallow Pharyngeal Material enters airway, remains ABOVE vocal cords then ejected out Pharyngeal- Puree Delayed swallow initiation-pyriform sinuses Pharyngeal Material does not enter airway Pharyngeal- Mechanical Soft Delayed swallow initiation-pyriform sinuses Pharyngeal Material does not enter airway Pharyngeal- Regular -- Pharyngeal -- Pharyngeal- Multi-consistency -- Pharyngeal -- Pharyngeal-  Pill Delayed swallow initiation-pyriform sinuses Pharyngeal Material does not enter airway Pharyngeal Comment --  CHL IP CERVICAL ESOPHAGEAL PHASE 09/07/2020 Cervical Esophageal Phase WFL Pudding Teaspoon -- Pudding Cup -- Honey Teaspoon -- Honey Cup -- Nectar Teaspoon -- Nectar Cup -- Nectar Straw -- Thin Teaspoon --  Thin Cup -- Thin Straw -- Puree -- Mechanical Soft -- Regular -- Multi-consistency -- Pill -- Cervical Esophageal Comment -- [name], MS [facility] SLP Acute Rehab Services Office [phone] Pager [phone] Chales Abrahams 09/07/2020, 10:04 AM              US THORACENTESIS ASP PLEURAL SPACE W/IMG GUIDE  Result Date: 09/06/2020 INDICATION: Patient with history of cerebral palsy, recent right below-knee amputation, pneumonia, right pleural effusion. Request received for diagnostic and therapeutic right thoracentesis. EXAM: ULTRASOUND GUIDED DIAGNOSTIC AND THERAPEUTIC RIGHT THORACENTESIS MEDICATIONS: 1% lidocaine to skin and subcutaneous tissue COMPLICATIONS: None immediate. PROCEDURE: An ultrasound guided thoracentesis was thoroughly discussed with the patient/ sister and questions answered. The benefits, risks, alternatives and complications were also discussed. The patient/sister understands and wishes to proceed with the procedure. Written consent was obtained. Ultrasound was performed to localize and mark an adequate pocket of fluid in the right chest. The area was then prepped and draped in the normal sterile fashion. 1% Lidocaine was used for local anesthesia. Under ultrasound guidance a 6 Fr Safe-T-Centesis catheter was introduced. Thoracentesis was performed. The catheter was removed and a dressing applied. FINDINGS: A total of approximately 520 cc of slightly hazy, yellow fluid was removed. Samples were sent to the laboratory as requested by the clinical team. IMPRESSION: Successful ultrasound guided diagnostic and therapeutic right thoracentesis yielding 520 cc of pleural fluid. Read by:  Jeananne Rama, PA-C Electronically Signed   By: Acquanetta Belling M.D.   On: 09/06/2020 11:28     TODAY-DAY OF DISCHARGE:  Subjective:   Naaman Plummer today has no headache,no chest abdominal pain,no new weakness tingling or numbness, feels much better wants to go home today.   Objective:   Blood pressure (!) 138/53, pulse 87, temperature 98.5 F (36.9 C), temperature source Axillary, resp. rate 17, SpO2 99 %.  Intake/Output Summary (Last 24 hours) at 10/06/2020 1007 Last data filed at 10/06/2020 0406 Gross per 24 hour  Intake --  Output 2750 ml  Net -2750 ml   There were no vitals filed for this visit.  Exam: Awake Alert, Oriented *3, No new F.N deficits, Normal affect [place].AT,PERRAL Supple Neck,No JVD, No cervical lymphadenopathy appriciated.  Symmetrical Chest wall movement, Good air movement bilaterally, CTAB RRR,No Gallops,Rubs or new Murmurs, No Parasternal Heave +ve B.Sounds, Abd Soft, Non tender, No organomegaly appriciated, No rebound -guarding or rigidity. No Cyanosis, Clubbing or edema, No new Rash or bruise   PERTINENT RADIOLOGIC STUDIES: No results found.   PERTINENT LAB RESULTS: CBC: Recent Labs    10/04/20 0549 10/06/20 0226  WBC 8.0 8.8  HGB 11.6* 11.9*  HCT 35.4* 36.6*  PLT 289 287   CMET CMP     Component Value Date/Time   NA 136 10/04/2020 0549   NA 141 04/15/2019 1109   K 4.0 10/04/2020 0549   CL 106 10/04/2020 0549   CO2 25 10/04/2020 0549   GLUCOSE 92 10/04/2020 0549   BUN 8 10/04/2020 0549   BUN 15 04/15/2019 1109   CREATININE 0.50 (L) 10/04/2020 0549   CREATININE 0.48 (L) 09/06/2019 0921   CALCIUM 8.9 10/04/2020 0549   PROT 5.8 (L) 10/03/2020 0340   PROT 6.9 06/04/2018 1440   ALBUMIN 2.5 (L) 10/03/2020 0340   ALBUMIN 3.7 06/04/2018 1440   AST 15 10/03/2020 0340   ALT 12 10/03/2020 0340   ALKPHOS 98 10/03/2020 0340   BILITOT 0.3 10/03/2020 0340   BILITOT <0.2 06/04/2018 1440   GFRNONAA >60 10/04/2020 0549  GFRNONAA 121 09/06/2019  0921   GFRAA >60 11/12/2019 1237   GFRAA 140 09/06/2019 0921    GFR CrCl cannot be calculated (Unknown ideal weight.). No results for input(s): LIPASE, AMYLASE in the last 72 hours. No results for input(s): CKTOTAL, CKMB, CKMBINDEX, TROPONINI in the last 72 hours. Invalid input(s): POCBNP No results for input(s): DDIMER in the last 72 hours. No results for input(s): HGBA1C in the last 72 hours. No results for input(s): CHOL, HDL, LDLCALC, TRIG, CHOLHDL, LDLDIRECT in the last 72 hours. No results for input(s): TSH, T4TOTAL, T3FREE, THYROIDAB in the last 72 hours.  Invalid input(s): FREET3 No results for input(s): VITAMINB12, FOLATE, FERRITIN, TIBC, IRON, RETICCTPCT in the last 72 hours. Coags: No results for input(s): INR in the last 72 hours.  Invalid input(s): PT Microbiology: Recent Results (from the past 240 hour(s))  Culture, blood (routine x 2)     Status: None (Preliminary result)   Collection Time: 10/03/20  3:43 AM   Specimen: BLOOD  Result Value Ref Range Status   Specimen Description BLOOD LEFT HAND  Final   Special Requests   Final    BOTTLES DRAWN AEROBIC AND ANAEROBIC Blood Culture results may not be optimal due to an inadequate volume of blood received in culture bottles   Culture   Final    NO GROWTH 2 DAYS Performed at [facility], [address] [place], [place] [address]    Report Status PENDING  Incomplete  SARS CORONAVIRUS 2 (TAT 6-24 HRS) Nasopharyngeal Nasopharyngeal Swab     Status: None   Collection Time: 10/03/20  5:20 AM   Specimen: Nasopharyngeal Swab  Result Value Ref Range Status   SARS Coronavirus 2 NEGATIVE NEGATIVE Final    Comment: (NOTE) SARS-CoV-2 target nucleic acids are NOT DETECTED.  The SARS-CoV-2 RNA is generally detectable in upper and lower respiratory specimens during the acute phase of infection. Negative results do not preclude SARS-CoV-2 infection, do not rule out co-infections with other pathogens, and should not be  used as the sole basis for treatment or other patient management decisions. Negative results must be combined with clinical observations, patient history, and epidemiological information. The expected result is Negative.  Fact Sheet for Patients: HairSlick.no  Fact Sheet for Healthcare Providers: quierodirigir.com  This test is not yet approved or cleared by the [place] FDA and  has been authorized for detection and/or diagnosis of SARS-CoV-2 by FDA under an Emergency Use Authorization (EUA). This EUA will remain  in effect (meaning this test can be used) for the duration of the COVID-19 declaration under Se ction 564(b)(1) of the Act, 21 U.S.C. section 360bbb-3(b)(1), unless the authorization is terminated or revoked sooner.  Performed at [facility], [address], [place], [place] [address]   Culture, blood (routine x 2)     Status: None (Preliminary result)   Collection Time: 10/03/20  7:57 AM   Specimen: BLOOD RIGHT HAND  Result Value Ref Range Status   Specimen Description BLOOD RIGHT HAND  Final   Special Requests   Final    BOTTLES DRAWN AEROBIC AND ANAEROBIC Blood Culture adequate volume   Culture   Final    NO GROWTH 2 DAYS Performed at [facility], [address] [place], [place] [address]    Report Status PENDING  Incomplete    FURTHER DISCHARGE INSTRUCTIONS:  Get Medicines reviewed and adjusted: Please take all your medications with you for your next visit with your Primary MD  Laboratory/radiological data: Please request your Primary MD to go over all hospital tests and procedure/radiological results at the follow up, please ask your Primary MD to get all Hospital records sent to his/her office.  In some cases, they will be blood work, cultures and biopsy results pending at the time of your discharge. Please request that your primary care M.D. goes through all the records of your  hospital data and follows up on these results.  Also Note the following: If you experience worsening of your admission symptoms, develop shortness of breath, life threatening emergency, suicidal or homicidal thoughts you must seek medical attention immediately by calling 911 or calling your MD immediately  if symptoms less severe.  You must read complete instructions/literature along with all the possible adverse reactions/side effects for all the Medicines you take and that have been prescribed to you. Take any new Medicines after you have completely understood and accpet all the possible adverse reactions/side effects.   Do not drive when taking Pain medications or sleeping medications (Benzodaizepines)  Do not take more than prescribed Pain, Sleep and Anxiety Medications. It is not advisable to combine anxiety,sleep and pain medications without talking with your primary care practitioner  Special Instructions: If you have smoked or chewed Tobacco  in the last 2 yrs please stop smoking, stop any regular Alcohol  and or any Recreational drug use.  Wear Seat belts while driving.  Please note: You were cared for by a hospitalist during your hospital stay. Once you are discharged, your primary care physician will handle any further medical issues. Please note that NO REFILLS for any discharge medications will be authorized once you are discharged, as it is imperative that you return to your primary care physician (or establish a relationship with a primary care physician if you do not have one) for your post hospital discharge needs so that they can reassess your need for medications and monitor your lab values.  Total Time spent coordinating discharge including counseling, education and face to face time equals 35 minutes.  SignedJeoffrey Massed 10/06/2020 10:07 AM

## 2020-10-08 LAB — CULTURE, BLOOD (ROUTINE X 2)
Culture: NO GROWTH
Culture: NO GROWTH
Special Requests: ADEQUATE

## 2020-10-10 LAB — CULTURE, BLOOD (ROUTINE X 2)
Culture: NO GROWTH
Culture: NO GROWTH
Special Requests: ADEQUATE
Special Requests: ADEQUATE

## 2020-10-13 ENCOUNTER — Other Ambulatory Visit: Payer: Self-pay | Admitting: Internal Medicine

## 2020-10-13 ENCOUNTER — Other Ambulatory Visit: Payer: Self-pay

## 2020-10-13 ENCOUNTER — Ambulatory Visit
Admission: RE | Admit: 2020-10-13 | Discharge: 2020-10-13 | Disposition: A | Discharge status: Home / Self Care | Payer: Medicare Other | Source: Ambulatory Visit | Attending: Internal Medicine | Admitting: Internal Medicine

## 2020-10-13 DIAGNOSIS — J690 Pneumonitis due to inhalation of food and vomit: Secondary | ICD-10-CM

## 2020-10-19 ENCOUNTER — Telehealth: Payer: Self-pay

## 2020-10-19 NOTE — Telephone Encounter (Signed)
Patient is currently at [facility]. Spoke with Inetta Fermo a employee there and she states she will give the supervisor [name] a message to return my call regarding scheduling a Palliative consult appointment.

## 2020-10-19 NOTE — Telephone Encounter (Signed)
Spoke with patient's caregiver @ [facility] and scheduled an in-person Palliative Consult for 11/29/20 @ 9AM.   COVID screening was negative. No pets in home. Patient lives in Group Home.  Consent obtained; updated Outlook/Netsmart/Team List and Epic.   [name] is aware she may be receiving a call from NP the day before or day of to confirm appointment.

## 2020-10-22 ENCOUNTER — Emergency Department (HOSPITAL_COMMUNITY): Payer: Medicare Other

## 2020-10-22 ENCOUNTER — Encounter (HOSPITAL_COMMUNITY): Payer: Self-pay

## 2020-10-22 ENCOUNTER — Other Ambulatory Visit: Payer: Self-pay

## 2020-10-22 ENCOUNTER — Emergency Department (HOSPITAL_COMMUNITY)
Admission: EM | Admit: 2020-10-22 | Discharge: 2020-10-22 | Disposition: A | Discharge status: Home / Self Care | Payer: Medicare Other | Attending: Emergency Medicine | Admitting: Emergency Medicine

## 2020-10-22 DIAGNOSIS — R110 Nausea: Secondary | ICD-10-CM | POA: Diagnosis not present

## 2020-10-22 DIAGNOSIS — R197 Diarrhea, unspecified: Secondary | ICD-10-CM | POA: Diagnosis not present

## 2020-10-22 DIAGNOSIS — R059 Cough, unspecified: Secondary | ICD-10-CM | POA: Diagnosis present

## 2020-10-22 DIAGNOSIS — R0789 Other chest pain: Secondary | ICD-10-CM | POA: Diagnosis not present

## 2020-10-22 DIAGNOSIS — Z20822 Contact with and (suspected) exposure to covid-19: Secondary | ICD-10-CM | POA: Insufficient documentation

## 2020-10-22 LAB — COMPREHENSIVE METABOLIC PANEL
ALT: 27 U/L (ref 0–44)
AST: 26 U/L (ref 15–41)
Albumin: 2.7 g/dL — ABNORMAL LOW (ref 3.5–5.0)
Alkaline Phosphatase: 126 U/L (ref 38–126)
Anion gap: 7 (ref 5–15)
BUN: 12 mg/dL (ref 6–20)
CO2: 25 mmol/L (ref 22–32)
Calcium: 9 mg/dL (ref 8.9–10.3)
Chloride: 105 mmol/L (ref 98–111)
Creatinine, Ser: 0.44 mg/dL — ABNORMAL LOW (ref 0.61–1.24)
GFR, Estimated: >60 mL/min (ref >60)
Glucose, Bld: 71 mg/dL (ref 70–99)
Potassium: 4 mmol/L (ref 3.5–5.1)
Sodium: 137 mmol/L (ref 135–145)
Total Bilirubin: 0.2 mg/dL — ABNORMAL LOW (ref 0.3–1.2)
Total Protein: 6.9 g/dL (ref 6.5–8.1)

## 2020-10-22 LAB — CBC WITH DIFFERENTIAL/PLATELET
Abs Immature Granulocytes: 0.12 10*3/uL — ABNORMAL HIGH (ref 0.00–0.07)
Basophils Absolute: 0.1 10*3/uL (ref 0.0–0.1)
Basophils Relative: 1 %
Eosinophils Absolute: 0.2 10*3/uL (ref 0.0–0.5)
Eosinophils Relative: 2 %
HCT: 37.4 % — ABNORMAL LOW (ref 39.0–52.0)
Hemoglobin: 11.7 g/dL — ABNORMAL LOW (ref 13.0–17.0)
Immature Granulocytes: 1 %
Lymphocytes Relative: 25 %
Lymphs Abs: 2.3 10*3/uL (ref 0.7–4.0)
MCH: 24.9 pg — ABNORMAL LOW (ref 26.0–34.0)
MCHC: 31.3 g/dL (ref 30.0–36.0)
MCV: 79.6 fL — ABNORMAL LOW (ref 80.0–100.0)
Monocytes Absolute: 1.2 10*3/uL — ABNORMAL HIGH (ref 0.1–1.0)
Monocytes Relative: 13 %
Neutro Abs: 5.5 10*3/uL (ref 1.7–7.7)
Neutrophils Relative %: 58 %
Platelets: 468 10*3/uL — ABNORMAL HIGH (ref 150–400)
RBC: 4.7 MIL/uL (ref 4.22–5.81)
RDW: 17.3 % — ABNORMAL HIGH (ref 11.5–15.5)
WBC: 9.3 10*3/uL (ref 4.0–10.5)
nRBC: 0 % (ref 0.0–0.2)

## 2020-10-22 LAB — URINALYSIS, ROUTINE W REFLEX MICROSCOPIC
Bacteria, UA: NONE SEEN
Bilirubin Urine: NEGATIVE
Glucose, UA: NEGATIVE mg/dL
Hgb urine dipstick: NEGATIVE
Ketones, ur: NEGATIVE mg/dL
Leukocytes,Ua: NEGATIVE
Nitrite: NEGATIVE
Protein, ur: NEGATIVE mg/dL
Specific Gravity, Urine: 1.03 (ref 1.005–1.030)
pH: 8 (ref 5.0–8.0)

## 2020-10-22 LAB — D-DIMER, QUANTITATIVE: D-Dimer, Quant: 2.57 ug/mL-FEU — ABNORMAL HIGH (ref 0.00–0.50)

## 2020-10-22 LAB — TROPONIN I (HIGH SENSITIVITY): Troponin I (High Sensitivity): 2 ng/L (ref <18)

## 2020-10-22 LAB — RESP PANEL BY RT-PCR (FLU A&B, COVID) ARPGX2
Influenza A by PCR: NEGATIVE
Influenza B by PCR: NEGATIVE
SARS Coronavirus 2 by RT PCR: NEGATIVE

## 2020-10-22 MED ORDER — SODIUM CHLORIDE (PF) 0.9 % IJ SOLN
INTRAMUSCULAR | Status: AC
  Filled 2020-10-22: qty 50

## 2020-10-22 MED ORDER — IOHEXOL 350 MG/ML SOLN
75.0000 mL | Freq: Once | INTRAVENOUS | Status: AC | PRN
  Administered 2020-10-22: 75 mL via INTRAVENOUS

## 2020-10-22 NOTE — ED Provider Notes (Addendum)
[facility]-EMERGENCY DEPT Provider Note   CSN: [id number] Arrival date & time: 10/22/20  7829     History Chief Complaint  Patient presents with  . Cough    [name] is a 60 y.o. male.  HPI Patient is a 60 year old male with a history of cerebral palsy bedbound at baseline, bipolar disorder, who presents to the emergency department due to productive cough for the past month.  Patient states he has been also experiencing anterior chest wall pain that worsens with palpation and coughing.  Patient recently admitted on May 9 and discharged on May 13 with recurrent pleural effusions as well as aspiration pneumonia.  Patient reports some associated nausea without vomiting.  Also reports intermittent diarrhea.  Patient is DNR.    Past Medical History:  Diagnosis Date  . Anemia    microcytic anemia   . Anxiety    takes Ativan daily as needed  . Bipolar disorder (HCC)    takes Depakote daily  . CEREBRAL PALSY 07/24/2006  . Chronic bronchitis (HCC)    "gets it q yr"  . Chronic bronchitis (HCC)   . 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)   . Constipation    takes Miralax daily as needed  . Depression    takes Risperdal nightly  . Environmental allergies    takes Zyrtec daily  . GASTRIC ULCER ACUTE WITH HEMORRHAGE 07/24/2006  . Gastric ulcer with hemorrhage    hx of   . GERD (gastroesophageal reflux disease)    takes Pravacid daily  . History of blood transfusion    no abnormal reaction noted  . History of hiatal hernia   . History of shingles   . History of stomach ulcers ~ 1996  . Hypercholesterolemia   . Intellectual functioning disability   . Intermittent explosive disorder   . Internal hemorrhoids   . MRSA infection 09/06/2019  . Obsessive compulsive disorder   . Osteomyelitis (HCC)    right foot   . Pneumonia "quite a few times"   in 2015 and in June 2016  . Pressure ulcer of sacral region    hx of   . Pseudomonas  aeruginosa infection 09/06/2019  . Psychotic disorder (HCC)   . Recurrent UTI (urinary tract infection)   . Seborrheic dermatitis   . Sigmoid diverticulosis   . Spastic tetraplegia (HCC)   . URINARY INCONTINENCE 02/09/2010   takes Detrol daily-occasionally incontinence  . Vitamin D deficiency   . Weakness    numbness in extremities    Patient Active Problem List   Diagnosis Date Noted  . Right pulmonary infiltrate on CXR 10/03/2020  . Dysphagia 10/03/2020  . GERD without esophagitis 10/03/2020  . Aspiration pneumonia (HCC) 10/03/2020  . Recurrent right pleural effusion 10/02/2020  . Abnormal CT scan, colon   . 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 Nausea and vomiting 07/31/2020  . 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 Osteomyelitis (HCC) 10/08/2019  . Acute osteomyelitis of right ankle or foot (HCC)   . Ulcerated, foot, right, with necrosis of bone (HCC)   . Pseudomonas aeruginosa infection 09/06/2019  . MRSA infection 09/06/2019  . Lactic acidosis   . Stage 3 skin ulcer of sacral region (HCC) 08/10/2018  . Gastric wall thickening 08/10/2018  . Alkaline phosphatase elevation 08/10/2018  . Cholelithiasis 08/10/2018  . Microcytic anemia   . Hiatal hernia 04/07/2018  . 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 (  HCC) 01/20/2016  . 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/15/2015  . Cerebral palsy, quadriplegic (HCC)   . Pre-diabetes 08/31/2015  . Foot ulcer, limited to breakdown of skin (HCC) 06/08/2015  . Seasonal allergies 11/01/2014  . Abdominal pain   . Pressure ulcer of foot 03/14/2014  . CAP (community acquired pneumonia) 03/13/2014  . Decubitus ulcer of left hip, stage 3 (HCC) 01/08/2013  . URINARY INCONTINENCE 02/09/2010  . Mental retardation 07/24/2006  . Infantile cerebral palsy (HCC) 07/24/2006    Past Surgical History:  Procedure Laterality Date  . AMPUTATION Right 12/14/2014   Procedure: AMPUTATION DIGIT RIGHT GREAT TOE MTP;  Surgeon:  Nadara Mustard, MD;  Location: [facility] OR;  Service: Orthopedics;  Laterality: Right;  . AMPUTATION Right 10/08/2019   Procedure: RIGHT FIFTH RAY AMPUTATION, ADJACENT TISSUE TRANSFER;  Surgeon: Park Liter, DPM;  Location: [facility];  Service: Podiatry;  Laterality: Right;  . AMPUTATION Right 07/27/2020   Procedure: AMPUTATION BELOW KNEE;  Surgeon: Toni Arthurs, MD;  Location: [facility] OR;  Service: Orthopedics;  Laterality: Right;   . BIOPSY  08/12/2018   Procedure: BIOPSY;  Surgeon: Lynann Bologna, MD;  Location: [facility];  Service: Gastroenterology;;  . COLONOSCOPY WITH PROPOFOL N/A 08/14/2018   Procedure: COLONOSCOPY WITH PROPOFOL;  Surgeon: Lynann Bologna, MD;  Location: [facility];  Service: Gastroenterology;  Laterality: N/A;  . ESOPHAGOGASTRODUODENOSCOPY (EGD) WITH PROPOFOL N/A 08/12/2018   Procedure: ESOPHAGOGASTRODUODENOSCOPY (EGD) WITH PROPOFOL;  Surgeon: Lynann Bologna, MD;  Location: [facility];  Service: Gastroenterology;  Laterality: N/A;  . GRAFT APPLICATION Right 11/12/2019   Procedure: SKIN GRAFT SUBSTITUTE APPLICATION;  Surgeon: Park Liter, DPM;  Location: [facility];  Service: Podiatry;  Laterality: Right;  . HERNIA REPAIR    . HIATAL HERNIA REPAIR  1982  . LEG SURGERY Bilateral ~ 1967   "legs were scissored; stretched them out"  . WOUND DEBRIDEMENT Right 11/12/2019   Procedure: DEBRIDEMENT WOUND;  Surgeon: Park Liter, DPM;  Location: [facility];  Service: Podiatry;  Laterality: Right;       Family History  Adopted: Yes    Social History   Tobacco Use  . Smoking status: Never Smoker  . Smokeless tobacco: Never Used  Substance Use Topics  . Alcohol use: No  . Drug use: No    Home Medications Prior to Admission medications   Medication Sig Start Date End Date Taking? Authorizing Provider  acetaminophen (TYLENOL) 500 MG tablet Take 500 mg by mouth every 6 (six) hours as needed (for aches, pains, and/or fevers).   Yes [provider]  albuterol (VENTOLIN  HFA) 108 (90 Base) MCG/ACT inhaler Inhale 2 puffs into the lungs every 4 (four) hours as needed for shortness of breath or wheezing. 01/11/20  Yes [provider]  alum & mag hydroxide-simeth (MAALOX/MYLANTA) 200-200-20 MG/5ML suspension Take 15-30 mLs by mouth every 6 (six) hours as needed for indigestion or heartburn (or nausea).   Yes [provider]  baclofen (LIORESAL) 20 MG tablet Take 1 tablet (20 mg total) by mouth 3 (three) times daily. 07/18/16  Yes Kirsteins, Victorino Sparrow, MD  bisacodyl (DULCOLAX) 10 MG suppository Place 1 suppository (10 mg total) rectally daily as needed for moderate constipation. Patient taking differently: Place 10 mg rectally See admin instructions. Place 10 mg rectally every other day and daily as needed for moderate constipation 08/03/20  Yes Sheikh, Omair Latif, DO  brompheniramine-pseudoephedrine (DIMETAPP) 1-15 MG/5ML ELIX Take 10 mLs by mouth every 8 (eight) hours as needed (for cold symptoms).  Yes [provider]  cetirizine (ZYRTEC) 10 MG tablet Take 1 tablet (10 mg total) by mouth daily. Patient taking differently: Take 10 mg by mouth at bedtime. 11/01/14  Yes Street, Stephanie Coup, MD  Cholecalciferol (VITAMIN D3) 5000 units CAPS Take 5,000 Units by mouth daily.   Yes [provider]  clotrimazole (LOTRIMIN) 1 % cream Apply 1 application topically 2 (two) times daily.   Yes [provider]  divalproex (DEPAKOTE SPRINKLE) 125 MG capsule Take 500 mg by mouth 2 (two) times daily.    Yes [provider]  feeding supplement (ENSURE ENLIVE / ENSURE PLUS) LIQD Take 237 mLs by mouth 2 (two) times daily between meals. 10/06/20 11/05/20 Yes Ghimire, Werner Lean, MD  FERREX 150 150 MG capsule Take 150 mg by mouth daily at 12 noon.   Yes [provider]  fluticasone (FLONASE) 50 MCG/ACT nasal spray Place 1 spray into both nostrils daily. 12/21/19  Yes [provider]  guaiFENesin-dextromethorphan (ROBITUSSIN DM)  100-10 MG/5ML syrup Take 10 mLs by mouth every 4 (four) hours as needed for cough.    Yes [provider]  hydrocortisone cream 1 % APPLY TOPICALLY AT BEDTIME AS NEEDED FOR ITCHING Patient taking differently: Apply 1 application topically See admin instructions. Topically to the face and neck rash twice daily AND Apply topically at bedtime as needed for itching 05/29/20  Yes Mullis, Kiersten P, DO  ketoconazole (NIZORAL) 2 % shampoo APPLY TOPICALLY TO AFFECTED AREA(S) TWICE WEEKLY AS DIRECTED Patient taking differently: Apply 1 application topically 2 (two) times a week. 12/22/19  Yes Mullis, Kiersten P, DO  Lido-PE-Glycerin-Petrolatum (PREPARATION H RAPID RELIEF) 5-0.25-14.4-15 % CREA Apply 1 application topically See admin instructions. Apply a thin layer topically to the affected area 2 times a day for residual hemorrhoidal skin tag   Yes [provider]  LORazepam (ATIVAN) 2 MG tablet Take 1 mg by mouth every 4 (four) hours as needed for anxiety.   Yes [provider]  Neomycin-Bacitracin-Polymyxin (TRIPLE ANTIBIOTIC EX) Apply 1 application topically daily as needed (cuts/scrapes/scratches).   Yes [provider]  omeprazole (PRILOSEC) 40 MG capsule Take 1 capsule (40 mg total) by mouth 2 (two) times daily. 08/15/18  Yes Rodolph Bong, MD  ondansetron (ZOFRAN) 4 MG tablet Take 1 tablet (4 mg total) by mouth every 6 (six) hours as needed for nausea. 08/03/20  Yes Sheikh, Omair Latif, DO  Oyster Shell (OYSTER CALCIUM) 500 MG TABS tablet Take 1,500 mg of elemental calcium by mouth daily.   Yes [provider]  phenylephrine-shark liver oil-mineral oil-petrolatum (PREPARATION H) 0.25-14-74.9 % rectal ointment Place 1 application rectally 2 (two) times daily.   Yes [provider]  polyethylene glycol (MIRALAX) 17 g packet Take 17 g by mouth 2 (two) times daily. Patient taking differently: Take 17 g by mouth daily as needed for mild constipation.  10/06/20  Yes Ghimire, Werner Lean, MD  risperiDONE (RISPERDAL) 2 MG tablet Take 2 mg by mouth at bedtime.   Yes [provider]  tamsulosin (FLOMAX) 0.4 MG CAPS capsule TAKE 1 CAPSULE BY MOUTH EVERY DAY *DO NOT CRUSH* Patient taking differently: Take 0.4 mg by mouth daily. 06/19/20  Yes Mullis, Kiersten P, DO  tolterodine (DETROL LA) 2 MG 24 hr capsule Take 1 capsule (2 mg total) by mouth daily. 04/08/13  Yes Street, Stephanie Coup, MD  tretinoin (RETIN-A) 0.025 % gel Apply 1 application topically See admin instructions. Apply to the affected areas of the face at  bedtime   Yes [provider]  vitamin C (VITAMIN C) 250 MG tablet Take 1 tablet (250 mg total) by mouth daily. 01/18/16  Yes Palma HolterGunadasa, Kanishka G, MD  VOLTAREN 1 % GEL APPLY 4 GRAMS TOPICALLY TO AFFECTED AREA(S) FOUR TIMES DAILY Patient taking differently: Apply 4 g topically See admin instructions. Apply 4 grams to affected areas four times a day 06/16/19  Yes Mullis, Kiersten P, DO  Zinc Oxide 40 % PSTE Apply 1 application topically 2 (two) times daily as needed (unspecified). Desitin Max strength paste apply thin layer topically to buttocks and scrotum Patient taking differently: Apply 1 application topically 2 (two) times daily as needed (to scrotum and buttocks- as directed). 06/07/20  Yes McDiarmid, Leighton Roachodd D, MD  Foot Care Products (PREVALON HEEL PROTECTOR) MISC 1 Device by Does not apply route daily. 06/20/15   Marquette SaaLancaster, Abigail Joseph, MD  senna-docusate (SENOKOT-S) 8.6-50 MG tablet Take 1 tablet by mouth at bedtime. Patient not taking: Reported on 10/22/2020 10/06/20   Maretta BeesGhimire, Shanker M, MD    Allergies    Adhesive [tape]  Review of Systems   Review of Systems  All other systems reviewed and are negative. Ten systems reviewed and are negative for acute change, except as noted in the HPI.   Physical Exam Updated Vital Signs BP (!) 104/56   Pulse 78   Temp 97.8 F (36.6 C) (Oral)   Resp (!) 21   Ht 6\' 2"  (1.88  m)   Wt 98 kg   SpO2 96%   BMI 27.74 kg/m   Physical Exam Vitals and nursing note reviewed.  Constitutional:      General: He is not in acute distress.    Appearance: He is normal weight. He is not ill-appearing, toxic-appearing or diaphoretic.     Comments: Lying comfortably.  Answering questions when asked.  Pleasant to converse with.  HENT:     Head: Normocephalic and atraumatic.     Right Ear: External ear normal.     Left Ear: External ear normal.     Nose: Nose normal.     Mouth/Throat:     Mouth: Mucous membranes are moist.     Pharynx: Oropharynx is clear. No oropharyngeal exudate or posterior oropharyngeal erythema.  Eyes:     General: No scleral icterus.       Right eye: No discharge.        Left eye: No discharge.     Conjunctiva/sclera: Conjunctivae normal.  Cardiovascular:     Rate and Rhythm: Normal rate and regular rhythm.     Pulses: Normal pulses.     Heart sounds: Normal heart sounds. No murmur heard. No friction rub. No gallop.      Comments: Regular rate and rhythm without murmurs, rubs, or gallops. Pulmonary:     Effort: Pulmonary effort is normal. No respiratory distress.     Breath sounds: Normal breath sounds. No stridor. No wheezing, rhonchi or rales.     Comments: Poor respiratory effort.  Lungs are clear to auscultation bilaterally.  Mild anterior chest wall tenderness.  No crepitus.  No overlying skin changes. Chest:     Chest wall: Tenderness present.  Abdominal:     General: Abdomen is flat.     Palpations: Abdomen is soft.     Tenderness: There is no abdominal tenderness.     Comments: Abdomen is flat, soft, and nontender.  Musculoskeletal:        General: Normal range of motion.  Cervical back: Normal range of motion and neck supple. No tenderness.  Skin:    General: Skin is warm and dry.  Neurological:     Mental Status: He is alert and oriented to person, place, and time.     Comments: Bedbound at baseline.  History of cerebral  palsy.  Right arm in forced flexion.  Follows commands when asked.   Psychiatric:        Mood and Affect: Mood normal.        Behavior: Behavior normal.    ED Results / Procedures / Treatments   Labs (all labs ordered are listed, but only abnormal results are displayed) Labs Reviewed  COMPREHENSIVE METABOLIC PANEL - Abnormal; Notable for the following components:      Result Value   Creatinine, Ser 0.44 (*)    Albumin 2.7 (*)    Total Bilirubin 0.2 (*)    All other components within normal limits  CBC WITH DIFFERENTIAL/PLATELET - Abnormal; Notable for the following components:   Hemoglobin 11.7 (*)    HCT 37.4 (*)    MCV 79.6 (*)    MCH 24.9 (*)    RDW 17.3 (*)    Platelets 468 (*)    Monocytes Absolute 1.2 (*)    Abs Immature Granulocytes 0.12 (*)    All other components within normal limits  D-DIMER, QUANTITATIVE - Abnormal; Notable for the following components:   D-Dimer, Quant 2.57 (*)    All other components within normal limits  RESP PANEL BY RT-PCR (FLU A&B, COVID) [id number]  URINALYSIS, ROUTINE W REFLEX MICROSCOPIC  TROPONIN I (HIGH SENSITIVITY)  TROPONIN I (HIGH SENSITIVITY)   EKG EKG Interpretation  Date/Time:  Sunday Oct 22 2020 10:10:04 EDT Ventricular Rate:  91 PR Interval:  142 QRS Duration: 93 QT Interval:  305 QTC Calculation: 376 R Axis:   138 Text Interpretation: Right and left arm electrode reversal, interpretation assumes no reversal Sinus rhythm Right axis deviation Abnormal R-wave progression, early transition Borderline repolarization abnormality since last tracing no significant change Confirmed by Rolan Bucco [phone]) on 10/22/2020 12:49:42 PM   Radiology CT Angio Chest PE W and/or Wo Contrast  Result Date: 10/22/2020 CLINICAL DATA:  Chest pain and shortness of breath. History of blood clots. Not currently anticoagulated. Elevated D-dimer. EXAM: CT ANGIOGRAPHY CHEST WITH CONTRAST TECHNIQUE: Multidetector CT imaging of the chest was performed  using the standard protocol during bolus administration of intravenous contrast. Multiplanar CT image reconstructions and MIPs were obtained to evaluate the vascular anatomy. CONTRAST:  75mL OMNIPAQUE IOHEXOL 350 MG/ML SOLN COMPARISON:  09/05/2020. FINDINGS: Cardiovascular: Pulmonary arteries are well opacified. There is no evidence of a pulmonary embolism. Heart is normal in size and configuration. Mild 2 vessel coronary artery calcifications. Great vessels are normal in caliber. Minor aortic atherosclerosis. No dissection. Mediastinum/Nodes: Moderate-sized hiatal hernia. Density in the distal esophagus has the appearance of contrast, but is stable from the prior CT. There are surgical vascular clips in the epigastric region. No neck base, mediastinal or hilar masses. No enlarged lymph nodes. Trachea is unremarkable. Lungs/Pleura: Small, right greater than left, pleural effusions. Dependent opacity in the right lung, most evident in the right middle and lower lobes. There is bronchial wall thickening to the right middle and lower lobes. Minimal dependent atelectasis in the left lower lobe. There is volume loss on the right with mediastinal shift to the right. No convincing pneumonia. No evidence of pulmonary edema. No lung mass or suspicious nodule. No pneumothorax. Upper Abdomen: No acute  abnormality. Musculoskeletal: No fracture or acute finding. No bone lesion. No chest wall masses. Review of the MIP images confirms the above findings. IMPRESSION: 1. No evidence of a pulmonary embolism. 2. Small, right greater than left pleural effusions. 3. Right middle and lower lobe, and to a lesser degree upper lobe, atelectasis. No convincing pneumonia and no evidence of pulmonary edema. Volume loss on the right with mediastinal shift to the right. These findings are stable from the prior CT angiogram. 4. Moderate-sized hiatal hernia. Surgical changes in the epigastric region consistent with a previous hernia repair. These  findings are also stable. Aortic Atherosclerosis (ICD10-I70.0). Electronically Signed   By: Amie Portland M.D.   On: 10/22/2020 15:21   DG Chest Portable 1 View  Result Date: 10/22/2020 CLINICAL DATA:  Cough EXAM: PORTABLE CHEST 1 VIEW COMPARISON:  Oct 13, 2020 FINDINGS: Evaluation is limited secondary to patient rotation. The cardiomediastinal silhouette is unchanged in contour when accounting for rotation.Hiatal hernia. Small RIGHT pleural effusion, similar in comparison to prior. No pneumothorax. Similar appearance of a RIGHT retrocardiac heterogeneous opacity. Surgical clips project over the upper abdomen. Multilevel degenerative changes of the thoracic spine. IMPRESSION: 1. Similar appearance of a RIGHT retrocardiac opacity. Differential considerations include infection, aspiration or atelectasis. 2.  Large hiatal hernia. Electronically Signed   By: Meda Klinefelter MD   On: 10/22/2020 11:25    Procedures Procedures   Medications Ordered in ED Medications  sodium chloride (PF) 0.9 % injection (  Not Given 10/22/20 1507)  iohexol (OMNIPAQUE) 350 MG/ML injection 75 mL (75 mLs Intravenous Contrast Given 10/22/20 1447)    ED Course  I have reviewed the triage vital signs and the nursing notes.  Pertinent labs & imaging results that were available during my care of the patient were reviewed by me and considered in my medical decision making (see chart for details).    MDM Rules/Calculators/A&P                          Pt is a 60 y.o. male who presents to the emergency department due to cough as well as chest wall pain.  Labs: CBC with a hemoglobin of 11.7, MCV of 79.6, platelets of 468, RDW of 17.3, absolute immature granulocytes of 0.12. CMP with a creatinine of 0.44, albumin of 2.7, total bilirubin of 0.2. Respiratory panel is negative. Troponin of 2. D-dimer elevated at 2.57. UA is without abnormalities.  Imaging: Chest x-ray shows similar appearance of a right retrocardiac  opacity.  Differential considerations include infection, aspiration, or atelectasis.  Patient has a large hiatal hernia. CT of the chest shows no evidence of a PE.  Small right greater than left pleural effusions.  Right middle and lower lobe and to a lesser degree upper lobe atelectasis.  No convincing PNA and no evidence of pulmonary edema.  Volume loss on the right with mediastinal shift to the right.  These findings are noted to be stable from prior CTA.  Moderate sized hiatal hernia.  Surgical changes in the epigastric region consistent with a previous hernia repair and are also stable.  I, Placido Sou, PA-C, personally reviewed and evaluated these images and lab results as part of my medical decision-making.  Patient's lab work and imaging today is reassuring.  CBC without leukocytosis or neutrophilia.  Hemoglobin stable at 11.7.  Normal kidney function.  No increase in troponin at 2.  Respiratory panel is negative.  CT of the chest appears stable.  Patient has been monitored in the emergency department and has had no episodes of tachycardia or hypoxia.  His sister is at bedside.  Feel that patient is stable for discharge at this time and both she and her brother are amenable with this.  Given strict return precautions and all questions were answered prior to discharge.  Note: Portions of this report may have been transcribed using voice recognition software. Every effort was made to ensure accuracy; however, inadvertent computerized transcription errors may be present.   Final Clinical Impression(s) / ED Diagnoses Final diagnoses:  Cough  Tenderness of chest wall    Rx / DC Orders ED Discharge Orders    None       Placido Sou, PA-C 10/22/20 1550    Placido Sou, PA-C 10/22/20 1746    Rolan Bucco, MD 10/25/20 [phone]

## 2020-10-22 NOTE — ED Notes (Signed)
[facility] called for transportation to [facility].

## 2020-10-22 NOTE — ED Notes (Signed)
Report called to [facility].

## 2020-10-22 NOTE — Discharge Instructions (Signed)
Please continue to closely monitor [name]'s symptoms.  If he develops any new or worsening symptoms please bring him back to the emergency department for reevaluation.  It was a pleasure to meet you both.

## 2020-10-22 NOTE — ED Triage Notes (Signed)
BIB EMS from [facility] with complaints of chest wall pain and productive cough x 1 month. Diagnosed with Aspiration PNA on 10/02/2020.

## 2020-11-06 ENCOUNTER — Encounter: Payer: Self-pay | Admitting: Gastroenterology

## 2020-11-06 ENCOUNTER — Ambulatory Visit (INDEPENDENT_AMBULATORY_CARE_PROVIDER_SITE_OTHER): Payer: Medicare Other | Admitting: Gastroenterology

## 2020-11-06 VITALS — BP 102/68 | HR 82

## 2020-11-06 DIAGNOSIS — R1312 Dysphagia, oropharyngeal phase: Secondary | ICD-10-CM | POA: Diagnosis not present

## 2020-11-06 DIAGNOSIS — K5904 Chronic idiopathic constipation: Secondary | ICD-10-CM | POA: Diagnosis not present

## 2020-11-06 DIAGNOSIS — R933 Abnormal findings on diagnostic imaging of other parts of digestive tract: Secondary | ICD-10-CM | POA: Diagnosis not present

## 2020-11-06 MED ORDER — FLEET ENEMA 7-19 GM/118ML RE ENEM: ENEMA | RECTAL | 0 refills | Status: DC

## 2020-11-06 MED ORDER — MAGNESIUM CITRATE PO SOLN: ORAL | 0 refills | Status: DC

## 2020-11-06 NOTE — Progress Notes (Signed)
 Referring Provider: Bakare, Mobolaji B, MD Primary Care Physician:  Bakare, Mobolaji B, MD  Chief complaint:  Hospital follow-up   IMPRESSION:  Abnormal CT AP showing rectal wall thickening, nonspecific mildly prominent small bowel loops and a large hiatal hernia.  Colonoscopy in March 2020 showed mild sigmoid diverticulosis, internal hemorrhoids. Suspect rectal wall thickening on CT AP represent proctitis related to constipation. Flex sig recommended for further evaluation.   Recent abdominal pain related to PNA, cough, constipation or musculoskeletal. Improved.   Chronic constipation: Continue Senekot 2 daily, Miralax 17 g daily, and Dulcolax suppository PRN. May increase Miralax to BID PRN.   S/P Bilroth II gastrojejunostomy.  History of iron deficiency anemia, likely d/t poor iron absorption post BII. Persistent mild microcytic anemia.   Decubitus ulcer: Managed by Dr. Bakare.   Oropharyngeal dysphagia: Continue pureed diet as recommended by Speech Pathology. He must hae his medications crushed.   PLAN: - Continue Senekot 2 daily, Miralax 17 g daily increased to BID PRN, and Dulcolax suppository PRN - Flexible sigmoidoscopy at the hospital to evaluate for proctitis  I spent over 40 minutes, including in depth chart review, independent review of recent CT scan, communicating results with the patient directly, face-to-face time with the patient, coordinating care, ordering studies and medications as appropriate, and documentation.   HPI: [name] is a 59 y.o. male for follow-up.  He has a history of cerebral palsy admitted with extensive right-sided pneumonia and a large right pleural effusion in April 2022.  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 He is wheelchair and bedbound. He lives in a group home and an attendant from his group home accompanies him today. I last saw [name] in the office in 2019. He had a colonoscopy in March 2020 that showed mild sigmoid diverticulosis, internal hemorrhoids, and no evidence of proctitis or colitis. EGD 2020 showed a tortuous lower third of the esophagus, large hiatal hernia measuring 7 cm, evidence of prior Nissen fundoplication and evidence of prior BII gastrojejunostomy.  He had moderate gastritis. Interval history is obtained through the patient, review of his electronic health record, and the attendant from his group home who accompanies him today.   Recent hospitalization for pneumonia. He had nonspecific abdominal pain during his stay and constipation.  CT of the abdomen / pelvis 09/04/20 showed increased stool in the colon to minimally prominent loops of small bowel in the mid abdomen and marked circumferential wall thickening of the rectum.  There is also some chronic persistent twisting of the mesentery in the mid to low abdomen noted. Admission labs showed WBC 11, hemoglobin 11.6 hematocrit 37.3 MCV of 79 procalcitonin less than 0.1. Flexible sigmoidoscopy recommended by Dr. Stark and Amy Esterwood when his pneumonia had resolved.  Having one large, formed BM most days. Abdominal pain has largely improved.  No blood or mucous.    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   Anemia    microcytic anemia    Anxiety    takes Ativan daily as needed   Bipolar disorder (HCC)    takes Depakote daily   CEREBRAL PALSY 07/24/2006   Chronic bronchitis (HCC)    "gets it q yr"   Chronic bronchitis (HCC)      Chronic diabetic ulcer of foot determined by examination [facility])    Constipation    takes Miralax daily as needed   Depression    takes Risperdal nightly   Environmental allergies    takes Zyrtec daily   GASTRIC  ULCER ACUTE WITH HEMORRHAGE 07/24/2006   Gastric ulcer with hemorrhage    hx of    GERD (gastroesophageal reflux disease)    takes Pravacid daily   History of blood transfusion    no abnormal reaction noted   History of hiatal hernia    History of shingles    History of stomach ulcers ~ 1996   Hypercholesterolemia    Intellectual functioning disability    Intermittent explosive disorder    Internal hemorrhoids    MRSA infection 09/06/2019   Obsessive compulsive disorder    Osteomyelitis (HCC)    right foot    Pneumonia "quite a few times"   in 2015 and in June 2016   Pressure ulcer of sacral region    hx of    Pseudomonas aeruginosa infection 09/06/2019   Psychotic disorder (HCC)    Recurrent UTI (urinary tract infection)    Seborrheic dermatitis    Sigmoid diverticulosis    Spastic tetraplegia (HCC)    URINARY INCONTINENCE 02/09/2010   takes Detrol daily-occasionally incontinence   Vitamin D deficiency    Weakness    numbness in extremities    Past Surgical History:  Procedure Laterality Date   AMPUTATION Right 12/14/2014   Procedure: AMPUTATION DIGIT RIGHT GREAT TOE MTP;  Surgeon: Nadara Mustard, MD;  Location: [facility] OR;  Service: Orthopedics;  Laterality: Right;   AMPUTATION Right 10/08/2019   Procedure: RIGHT FIFTH RAY AMPUTATION, ADJACENT TISSUE TRANSFER;  Surgeon: Park Liter, DPM;  Location: [facility];  Service: Podiatry;  Laterality: Right;   AMPUTATION Right 07/27/2020   Procedure: AMPUTATION BELOW KNEE;  Surgeon: Toni Arthurs, MD;  Location: [facility] OR;  Service: Orthopedics;  Laterality: Right;    BIOPSY  08/12/2018   Procedure: BIOPSY;  Surgeon: Lynann Bologna, MD;  Location: [facility];  Service: Gastroenterology;;   COLONOSCOPY WITH PROPOFOL N/A 08/14/2018   Procedure: COLONOSCOPY WITH PROPOFOL;  Surgeon: Lynann Bologna, MD;  Location: [facility];  Service: Gastroenterology;  Laterality: N/A;   ESOPHAGOGASTRODUODENOSCOPY (EGD) WITH PROPOFOL N/A 08/12/2018    Procedure: ESOPHAGOGASTRODUODENOSCOPY (EGD) WITH PROPOFOL;  Surgeon: Lynann Bologna, MD;  Location: [facility];  Service: Gastroenterology;  Laterality: N/A;   GRAFT APPLICATION Right 11/12/2019   Procedure: SKIN GRAFT SUBSTITUTE APPLICATION;  Surgeon: Park Liter, DPM;  Location: [facility];  Service: Podiatry;  Laterality: Right;   HERNIA REPAIR     HIATAL HERNIA REPAIR  1982   LEG SURGERY Bilateral ~ 1967   "legs were scissored; stretched them out"   WOUND DEBRIDEMENT Right 11/12/2019   Procedure: DEBRIDEMENT WOUND;  Surgeon: Park Liter, DPM;  Location: [facility];  Service: Podiatry;  Laterality: Right;    Current Outpatient Medications  Medication Sig Dispense Refill   acetaminophen (TYLENOL) 500 MG tablet Take 500 mg by mouth every 6 (six) hours as needed (for aches, pains, and/or fevers).     albuterol (VENTOLIN HFA) 108 (90 Base) MCG/ACT inhaler Inhale 2 puffs into the lungs every 4 (four) hours as needed for shortness of breath or wheezing.     alum & mag hydroxide-simeth (MAALOX/MYLANTA) 200-200-20 MG/5ML suspension Take 15-30 mLs by mouth every 6 (six) hours as needed for indigestion or heartburn (or nausea).     baclofen (  LIORESAL) 20 MG tablet Take 1 tablet (20 mg total) by mouth 3 (three) times daily. 90 each 1   bisacodyl (DULCOLAX) 10 MG suppository Place 1 suppository (10 mg total) rectally daily as needed for moderate constipation. (Patient taking differently: Place 10 mg rectally See admin instructions. Place 10 mg rectally every other day and daily as needed for moderate constipation) 12 suppository 0   brompheniramine-pseudoephedrine (DIMETAPP) 1-15 MG/5ML ELIX Take 10 mLs by mouth every 8 (eight) hours as needed (for cold symptoms).     cetirizine (ZYRTEC) 10 MG tablet Take 1 tablet (10 mg total) by mouth daily. (Patient taking differently: Take 10 mg by mouth at bedtime.) 30 tablet 3   Cholecalciferol (VITAMIN D3) 5000 units CAPS Take 5,000 Units by mouth daily.      clotrimazole (LOTRIMIN) 1 % cream Apply 1 application topically 2 (two) times daily.     divalproex (DEPAKOTE SPRINKLE) 125 MG capsule Take 500 mg by mouth 2 (two) times daily.      FERREX 150 150 MG capsule Take 150 mg by mouth daily at 12 noon.     fluticasone (FLONASE) 50 MCG/ACT nasal spray Place 1 spray into both nostrils daily.     Foot Care Products (PREVALON HEEL PROTECTOR) MISC 1 Device by Does not apply route daily. 2 each 0   guaiFENesin-dextromethorphan (ROBITUSSIN DM) 100-10 MG/5ML syrup Take 10 mLs by mouth every 4 (four) hours as needed for cough.      hydrocortisone cream 1 % APPLY TOPICALLY AT BEDTIME AS NEEDED FOR ITCHING (Patient taking differently: Apply 1 application topically See admin instructions. Topically to the face and neck rash twice daily AND Apply topically at bedtime as needed for itching) 28 g PRN   ketoconazole (NIZORAL) 2 % shampoo APPLY TOPICALLY TO AFFECTED AREA(S) TWICE WEEKLY AS DIRECTED (Patient taking differently: Apply 1 application topically 2 (two) times a week.) 120 mL 4   Lido-PE-Glycerin-Petrolatum (PREPARATION H RAPID RELIEF) 5-0.25-14.4-15 % CREA Apply 1 application topically See admin instructions. Apply a thin layer topically to the affected area 2 times a day for residual hemorrhoidal skin tag     LORazepam (ATIVAN) 2 MG tablet Take 1 mg by mouth every 4 (four) hours as needed for anxiety.     Neomycin-Bacitracin-Polymyxin (TRIPLE ANTIBIOTIC EX) Apply 1 application topically daily as needed (cuts/scrapes/scratches).     omeprazole (PRILOSEC) 40 MG capsule Take 1 capsule (40 mg total) by mouth 2 (two) times daily. 60 capsule 0   ondansetron (ZOFRAN) 4 MG tablet Take 1 tablet (4 mg total) by mouth every 6 (six) hours as needed for nausea. 20 tablet 0   Oyster Shell (OYSTER CALCIUM) 500 MG TABS tablet Take 1,500 mg of elemental calcium by mouth daily.     phenylephrine-shark liver oil-mineral oil-petrolatum (PREPARATION H) 0.25-14-74.9 % rectal ointment  Place 1 application rectally 2 (two) times daily.     polyethylene glycol (MIRALAX) 17 g packet Take 17 g by mouth 2 (two) times daily. (Patient taking differently: Take 17 g by mouth daily as needed for mild constipation.) 14 each 0   risperiDONE (RISPERDAL) 2 MG tablet Take 2 mg by mouth at bedtime.     senna-docusate (SENOKOT-S) 8.6-50 MG tablet Take 1 tablet by mouth at bedtime. (Patient not taking: Reported on 10/22/2020) 30 tablet 0   tamsulosin (FLOMAX) 0.4 MG CAPS capsule TAKE 1 CAPSULE BY MOUTH EVERY DAY *DO NOT CRUSH* (Patient taking differently: Take 0.4 mg by mouth daily.) 30 capsule 2   tolterodine (  DETROL LA) 2 MG 24 hr capsule Take 1 capsule (2 mg total) by mouth daily. 90 capsule 3   tretinoin (RETIN-A) 0.025 % gel Apply 1 application topically See admin instructions. Apply to the affected areas of the face at bedtime     vitamin C (VITAMIN C) 250 MG tablet Take 1 tablet (250 mg total) by mouth daily. 30 tablet 0   VOLTAREN 1 % GEL APPLY 4 GRAMS TOPICALLY TO AFFECTED AREA(S) FOUR TIMES DAILY (Patient taking differently: Apply 4 g topically See admin instructions. Apply 4 grams to affected areas four times a day) 100 g 11   Zinc Oxide 40 % PSTE Apply 1 application topically 2 (two) times daily as needed (unspecified). Desitin Max strength paste apply thin layer topically to buttocks and scrotum (Patient taking differently: Apply 1 application topically 2 (two) times daily as needed (to scrotum and buttocks- as directed).) 113 g PRN   No current facility-administered medications for this visit.    Allergies as of 11/06/2020 - Review Complete 11/06/2020  Allergen Reaction Noted   Adhesive [tape] Rash 12/21/2010    Family History  Adopted: Yes  Problem Relation Age of Onset   Colon cancer Neg Hx       Physical Exam: General:   Alert,  well-nourished, pleasant and cooperative in NAD Head:  Normocephalic and atraumatic. Eyes:  Sclera clear, no icterus.   Conjunctiva  pink. Abdomen:  Soft, nontender, nondistended, normal bowel sounds, no rebound or guarding. No hepatosplenomegaly.   Neurologic:  Alert and  oriented x4;  grossly nonfocal Skin:  Intact without significant lesions or rashes. Psych:  Alert and cooperative. Normal mood and affect.    Annalaura Sauseda L. Orvan Falconer, MD, MPH 11/06/2020, 11:11 AM

## 2020-11-06 NOTE — Patient Instructions (Signed)
It was a pleasure to see you today. Based on our discussion, I am providing you with my recommendations below:  RECOMMENDATION(S):   - Continue Senekot 2 times daily, Miralax 17 gram daily, and Dulcolax suppository as needed  FLEXIBLE SIGMOIDOSCOPY:   You have been scheduled for a Flexible Sigmoidoscopy. Please follow written instructions given to you at your visit today.   PREP:   Please pick up your prep supplies at the pharmacy within the next 1-3 days.  INHALERS:   If you use inhalers (even only as needed), please bring them with you on the day of your procedure.  COLONOSCOPY TIPS:  To reduce nausea and dehydration, stay well hydrated for 3-4 days prior to the exam.  To prevent skin/hemorrhoid irritation - prior to wiping, put A&Dointment or vaseline on the toilet paper. Keep a towel or pad on the bed.  BEFORE STARTING YOUR PREP, drink  64oz of clear liquids in the morning. This will help to flush the colon and will ensure you are well hydrated!!!!  NOTE - This is in addition to the fluids required for to complete your prep. Use of a flavored hard candy, such as grape Rubin Payor, can counteract some of the flavor of the prep and may prevent some nausea.   BMI:  If you are age 37 or younger, your body mass index should be between 19-25. Your There is no height or weight on file to calculate BMI. If this is out of the aformentioned range listed, please consider follow up with your Primary Care Provider.   MY CHART:  The [place] [facility] providers would like to encourage you to use [facility] to communicate with providers for non-urgent requests or questions.  Due to long hold times on the telephone, sending your provider a message by [facility] may be a faster and more efficient way to get a response.  Please allow 48 business hours for a response.  Please remember that this is for non-urgent requests.   FOLLOW UP:  After your procedure, you will receive a call from my office staff  regarding my recommendation for follow up.  Thank you for trusting me with your gastrointestinal care!    [name], MD, MPH

## 2020-11-06 NOTE — H&P (View-Only) (Signed)
Referring Provider: Harvest [name], [name] B, MD Primary Care Physician:  [name], [name] B, MD  Chief complaint:  Hospital follow-up   IMPRESSION:  Abnormal CT AP showing rectal wall thickening, nonspecific mildly prominent small bowel loops and a large hiatal hernia.  Colonoscopy in March 2020 showed mild sigmoid diverticulosis, internal hemorrhoids. Suspect rectal wall thickening on CT AP represent proctitis related to constipation. Flex sig recommended for further evaluation.   Recent abdominal pain related to PNA, cough, constipation or musculoskeletal. Improved.   Chronic constipation: Continue Senekot 2 daily, Miralax 17 g daily, and Dulcolax suppository PRN. May increase Miralax to BID PRN.   S/P Bilroth II gastrojejunostomy.  History of iron deficiency anemia, likely d/t poor iron absorption post BII. Persistent mild microcytic anemia.   Decubitus ulcer: Managed by Dr. Corky [name].   Oropharyngeal dysphagia: Continue pureed diet as recommended by Speech Pathology. He must hae his medications crushed.   PLAN: - Continue Senekot 2 daily, Miralax 17 g daily increased to BID PRN, and Dulcolax suppository PRN - Flexible sigmoidoscopy at the hospital to evaluate for proctitis  I spent over 40 minutes, including in depth chart review, independent review of recent CT scan, communicating results with the patient directly, face-to-face time with the patient, coordinating care, ordering studies and medications as appropriate, and documentation.   HPI: [name] is a 60 y.o. male for follow-up.  He has a history of cerebral palsy admitted with extensive right-sided pneumonia and a large right pleural effusion in April 2022.  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 He is wheelchair and bedbound. He lives in a group home and an attendant from his group home accompanies him today. I last saw [name] in the office in 2019. He had a colonoscopy in March 2020 that showed mild sigmoid diverticulosis, internal hemorrhoids, and no evidence of proctitis or colitis. EGD 2020 showed a tortuous lower third of the esophagus, large hiatal hernia measuring 7 cm, evidence of prior Nissen fundoplication and evidence of prior BII gastrojejunostomy.  He had moderate gastritis. Interval history is obtained through the patient, review of his electronic health record, and the attendant from his group home who accompanies him today.   Recent hospitalization for pneumonia. He had nonspecific abdominal pain during his stay and constipation.  CT of the abdomen / pelvis 09/04/20 showed increased stool in the colon to minimally prominent loops of small bowel in the mid abdomen and marked circumferential wall thickening of the rectum.  There is also some chronic persistent twisting of the mesentery in the mid to low abdomen noted. Admission labs showed WBC 11, hemoglobin 11.6 hematocrit 37.3 MCV of 79 procalcitonin less than 0.1. Flexible sigmoidoscopy recommended by Dr. Russella [name] and [name] when his pneumonia had resolved.  Having one large, formed BM most days. Abdominal pain has largely improved.  No blood or mucous.    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   Anemia    microcytic anemia    Anxiety    takes Ativan daily as needed   Bipolar disorder (HCC)    takes Depakote daily   CEREBRAL PALSY 07/24/2006   Chronic bronchitis (HCC)    "gets it q yr"   Chronic bronchitis (HCC)  Chronic diabetic ulcer of foot determined by examination [facility])    Constipation    takes Miralax daily as needed   Depression    takes Risperdal nightly   Environmental allergies    takes Zyrtec daily   GASTRIC  ULCER ACUTE WITH HEMORRHAGE 07/24/2006   Gastric ulcer with hemorrhage    hx of    GERD (gastroesophageal reflux disease)    takes Pravacid daily   History of blood transfusion    no abnormal reaction noted   History of hiatal hernia    History of shingles    History of stomach ulcers ~ 1996   Hypercholesterolemia    Intellectual functioning disability    Intermittent explosive disorder    Internal hemorrhoids    MRSA infection 09/06/2019   Obsessive compulsive disorder    Osteomyelitis (HCC)    right foot    Pneumonia "quite a few times"   in 2015 and in June 2016   Pressure ulcer of sacral region    hx of    Pseudomonas aeruginosa infection 09/06/2019   Psychotic disorder (HCC)    Recurrent UTI (urinary tract infection)    Seborrheic dermatitis    Sigmoid diverticulosis    Spastic tetraplegia (HCC)    URINARY INCONTINENCE 02/09/2010   takes Detrol daily-occasionally incontinence   Vitamin D deficiency    Weakness    numbness in extremities    Past Surgical History:  Procedure Laterality Date   AMPUTATION Right 12/14/2014   Procedure: AMPUTATION DIGIT RIGHT GREAT TOE MTP;  Surgeon: Nadara Mustard, MD;  Location: [facility] OR;  Service: Orthopedics;  Laterality: Right;   AMPUTATION Right 10/08/2019   Procedure: RIGHT FIFTH RAY AMPUTATION, ADJACENT TISSUE TRANSFER;  Surgeon: Park Liter, DPM;  Location: [facility] ORS;  Service: Podiatry;  Laterality: Right;   AMPUTATION Right 07/27/2020   Procedure: AMPUTATION BELOW KNEE;  Surgeon: Toni Arthurs, MD;  Location: [facility] OR;  Service: Orthopedics;  Laterality: Right;    BIOPSY  08/12/2018   Procedure: BIOPSY;  Surgeon: Lynann Bologna, MD;  Location: [facility];  Service: Gastroenterology;;   COLONOSCOPY WITH PROPOFOL N/A 08/14/2018   Procedure: COLONOSCOPY WITH PROPOFOL;  Surgeon: Lynann Bologna, MD;  Location: [facility];  Service: Gastroenterology;  Laterality: N/A;   ESOPHAGOGASTRODUODENOSCOPY (EGD) WITH PROPOFOL N/A 08/12/2018    Procedure: ESOPHAGOGASTRODUODENOSCOPY (EGD) WITH PROPOFOL;  Surgeon: Lynann Bologna, MD;  Location: [facility];  Service: Gastroenterology;  Laterality: N/A;   GRAFT APPLICATION Right 11/12/2019   Procedure: SKIN GRAFT SUBSTITUTE APPLICATION;  Surgeon: Park Liter, DPM;  Location: [facility];  Service: Podiatry;  Laterality: Right;   HERNIA REPAIR     HIATAL HERNIA REPAIR  1982   LEG SURGERY Bilateral ~ 1967   "legs were scissored; stretched them out"   WOUND DEBRIDEMENT Right 11/12/2019   Procedure: DEBRIDEMENT WOUND;  Surgeon: Park Liter, DPM;  Location: [facility];  Service: Podiatry;  Laterality: Right;    Current Outpatient Medications  Medication Sig Dispense Refill   acetaminophen (TYLENOL) 500 MG tablet Take 500 mg by mouth every 6 (six) hours as needed (for aches, pains, and/or fevers).     albuterol (VENTOLIN HFA) 108 (90 Base) MCG/ACT inhaler Inhale 2 puffs into the lungs every 4 (four) hours as needed for shortness of breath or wheezing.     alum & mag hydroxide-simeth (MAALOX/MYLANTA) 200-200-20 MG/5ML suspension Take 15-30 mLs by mouth every 6 (six) hours as needed for indigestion or heartburn (or nausea).     baclofen (  LIORESAL) 20 MG tablet Take 1 tablet (20 mg total) by mouth 3 (three) times daily. 90 each 1   bisacodyl (DULCOLAX) 10 MG suppository Place 1 suppository (10 mg total) rectally daily as needed for moderate constipation. (Patient taking differently: Place 10 mg rectally See admin instructions. Place 10 mg rectally every other day and daily as needed for moderate constipation) 12 suppository 0   brompheniramine-pseudoephedrine (DIMETAPP) 1-15 MG/5ML ELIX Take 10 mLs by mouth every 8 (eight) hours as needed (for cold symptoms).     cetirizine (ZYRTEC) 10 MG tablet Take 1 tablet (10 mg total) by mouth daily. (Patient taking differently: Take 10 mg by mouth at bedtime.) 30 tablet 3   Cholecalciferol (VITAMIN D3) 5000 units CAPS Take 5,000 Units by mouth daily.      clotrimazole (LOTRIMIN) 1 % cream Apply 1 application topically 2 (two) times daily.     divalproex (DEPAKOTE SPRINKLE) 125 MG capsule Take 500 mg by mouth 2 (two) times daily.      FERREX 150 150 MG capsule Take 150 mg by mouth daily at 12 noon.     fluticasone (FLONASE) 50 MCG/ACT nasal spray Place 1 spray into both nostrils daily.     Foot Care Products (PREVALON HEEL PROTECTOR) MISC 1 Device by Does not apply route daily. 2 each 0   guaiFENesin-dextromethorphan (ROBITUSSIN DM) 100-10 MG/5ML syrup Take 10 mLs by mouth every 4 (four) hours as needed for cough.      hydrocortisone cream 1 % APPLY TOPICALLY AT BEDTIME AS NEEDED FOR ITCHING (Patient taking differently: Apply 1 application topically See admin instructions. Topically to the face and neck rash twice daily AND Apply topically at bedtime as needed for itching) 28 g PRN   ketoconazole (NIZORAL) 2 % shampoo APPLY TOPICALLY TO AFFECTED AREA(S) TWICE WEEKLY AS DIRECTED (Patient taking differently: Apply 1 application topically 2 (two) times a week.) 120 mL 4   Lido-PE-Glycerin-Petrolatum (PREPARATION H RAPID RELIEF) 5-0.25-14.4-15 % CREA Apply 1 application topically See admin instructions. Apply a thin layer topically to the affected area 2 times a day for residual hemorrhoidal skin tag     LORazepam (ATIVAN) 2 MG tablet Take 1 mg by mouth every 4 (four) hours as needed for anxiety.     Neomycin-Bacitracin-Polymyxin (TRIPLE ANTIBIOTIC EX) Apply 1 application topically daily as needed (cuts/scrapes/scratches).     omeprazole (PRILOSEC) 40 MG capsule Take 1 capsule (40 mg total) by mouth 2 (two) times daily. 60 capsule 0   ondansetron (ZOFRAN) 4 MG tablet Take 1 tablet (4 mg total) by mouth every 6 (six) hours as needed for nausea. 20 tablet 0   Oyster Shell (OYSTER CALCIUM) 500 MG TABS tablet Take 1,500 mg of elemental calcium by mouth daily.     phenylephrine-shark liver oil-mineral oil-petrolatum (PREPARATION H) 0.25-14-74.9 % rectal ointment  Place 1 application rectally 2 (two) times daily.     polyethylene glycol (MIRALAX) 17 g packet Take 17 g by mouth 2 (two) times daily. (Patient taking differently: Take 17 g by mouth daily as needed for mild constipation.) 14 each 0   risperiDONE (RISPERDAL) 2 MG tablet Take 2 mg by mouth at bedtime.     senna-docusate (SENOKOT-S) 8.6-50 MG tablet Take 1 tablet by mouth at bedtime. (Patient not taking: Reported on 10/22/2020) 30 tablet 0   tamsulosin (FLOMAX) 0.4 MG CAPS capsule TAKE 1 CAPSULE BY MOUTH EVERY DAY *DO NOT CRUSH* (Patient taking differently: Take 0.4 mg by mouth daily.) 30 capsule 2   tolterodine (  DETROL LA) 2 MG 24 hr capsule Take 1 capsule (2 mg total) by mouth daily. 90 capsule 3   tretinoin (RETIN-A) 0.025 % gel Apply 1 application topically See admin instructions. Apply to the affected areas of the face at bedtime     vitamin C (VITAMIN C) 250 MG tablet Take 1 tablet (250 mg total) by mouth daily. 30 tablet 0   VOLTAREN 1 % GEL APPLY 4 GRAMS TOPICALLY TO AFFECTED AREA(S) FOUR TIMES DAILY (Patient taking differently: Apply 4 g topically See admin instructions. Apply 4 grams to affected areas four times a day) 100 g 11   Zinc Oxide 40 % PSTE Apply 1 application topically 2 (two) times daily as needed (unspecified). Desitin Max strength paste apply thin layer topically to buttocks and scrotum (Patient taking differently: Apply 1 application topically 2 (two) times daily as needed (to scrotum and buttocks- as directed).) 113 g PRN   No current facility-administered medications for this visit.    Allergies as of 11/06/2020 - Review Complete 11/06/2020  Allergen Reaction Noted   Adhesive [tape] Rash 12/21/2010    Family History  Adopted: Yes  Problem Relation Age of Onset   Colon cancer Neg Hx       Physical Exam: General:   Alert,  well-nourished, pleasant and cooperative in NAD Head:  Normocephalic and atraumatic. Eyes:  Sclera clear, no icterus.   Conjunctiva  pink. Abdomen:  Soft, nontender, nondistended, normal bowel sounds, no rebound or guarding. No hepatosplenomegaly.   Neurologic:  Alert and  oriented x4;  grossly nonfocal Skin:  Intact without significant lesions or rashes. Psych:  Alert and cooperative. Normal mood and affect.    Lilliane Sposito L. Orvan Falconer, MD, MPH 11/06/2020, 11:11 AM

## 2020-11-08 NOTE — Anesthesia Preprocedure Evaluation (Addendum)
Anesthesia Evaluation  Patient identified by MRN, date of birth, ID band Patient awake    Reviewed: Allergy & Precautions, NPO status , Patient's Chart, lab work & pertinent test results  Airway Mallampati: II  TM Distance: >3 FB Neck ROM: Full    Dental no notable dental hx. (+) Teeth Intact, Dental Advisory Given   Pulmonary pneumonia,  Recent pneumonia   Pulmonary exam normal breath sounds clear to auscultation       Cardiovascular Normal cardiovascular exam Rhythm:Regular Rate:Normal     Neuro/Psych PSYCHIATRIC DISORDERS Anxiety Depression Bipolar Disorder Schizophrenia Cerebral palsy    GI/Hepatic hiatal hernia, GERD  ,  Endo/Other  diabetes  Renal/GU      Musculoskeletal   Abdominal   Peds  Hematology  (+) anemia ,   Anesthesia Other Findings Pt contracted  Reproductive/Obstetrics                           Anesthesia Physical Anesthesia Plan  ASA: 3  Anesthesia Plan: MAC   Post-op Pain Management:    Induction:   PONV Risk Score and Plan: Treatment may vary due to age or medical condition  Airway Management Planned: Natural Airway and Nasal Cannula  Additional Equipment: None  Intra-op Plan:   Post-operative Plan:   Informed Consent:     Dental advisory given and Consent reviewed with POA  Plan Discussed with: CRNA and Anesthesiologist  Anesthesia Plan Comments: (Constipation, and abnormal CT for flex sig)       Anesthesia Quick Evaluation

## 2020-11-08 NOTE — Progress Notes (Signed)
Attempted to obtain medical history via telephone, unable to reach at this time. Voicemail for [name] was full unable to leave a voicemail.

## 2020-11-08 NOTE — Progress Notes (Signed)
Spoke with Inetta Fermo, Caregiver at [facility] gave her pre op instructions, she read-back the instructions and verbalized understanding.

## 2020-11-09 ENCOUNTER — Encounter (HOSPITAL_COMMUNITY)
Admission: RE | Disposition: A | Discharge status: Home / Self Care | Payer: Self-pay | Source: Home / Self Care | Attending: Gastroenterology

## 2020-11-09 ENCOUNTER — Encounter (HOSPITAL_COMMUNITY): Payer: Self-pay | Admitting: Gastroenterology

## 2020-11-09 ENCOUNTER — Ambulatory Visit (HOSPITAL_COMMUNITY): Payer: Medicare Other | Admitting: Anesthesiology

## 2020-11-09 ENCOUNTER — Other Ambulatory Visit: Payer: Self-pay

## 2020-11-09 ENCOUNTER — Ambulatory Visit (HOSPITAL_COMMUNITY)
Admission: RE | Admit: 2020-11-09 | Discharge: 2020-11-09 | Disposition: A | Discharge status: Home / Self Care | Payer: Medicare Other | Attending: Gastroenterology | Admitting: Gastroenterology

## 2020-11-09 DIAGNOSIS — K5730 Diverticulosis of large intestine without perforation or abscess without bleeding: Secondary | ICD-10-CM

## 2020-11-09 DIAGNOSIS — L89899 Pressure ulcer of other site, unspecified stage: Secondary | ICD-10-CM | POA: Diagnosis not present

## 2020-11-09 DIAGNOSIS — R1312 Dysphagia, oropharyngeal phase: Secondary | ICD-10-CM | POA: Insufficient documentation

## 2020-11-09 DIAGNOSIS — R933 Abnormal findings on diagnostic imaging of other parts of digestive tract: Secondary | ICD-10-CM | POA: Diagnosis present

## 2020-11-09 DIAGNOSIS — K648 Other hemorrhoids: Secondary | ICD-10-CM | POA: Diagnosis not present

## 2020-11-09 DIAGNOSIS — Z89511 Acquired absence of right leg below knee: Secondary | ICD-10-CM | POA: Insufficient documentation

## 2020-11-09 DIAGNOSIS — Z980 Intestinal bypass and anastomosis status: Secondary | ICD-10-CM | POA: Insufficient documentation

## 2020-11-09 DIAGNOSIS — K5909 Other constipation: Secondary | ICD-10-CM | POA: Diagnosis not present

## 2020-11-09 DIAGNOSIS — R419 Unspecified symptoms and signs involving cognitive functions and awareness: Secondary | ICD-10-CM | POA: Insufficient documentation

## 2020-11-09 DIAGNOSIS — Z993 Dependence on wheelchair: Secondary | ICD-10-CM | POA: Diagnosis not present

## 2020-11-09 DIAGNOSIS — F319 Bipolar disorder, unspecified: Secondary | ICD-10-CM | POA: Diagnosis not present

## 2020-11-09 DIAGNOSIS — G800 Spastic quadriplegic cerebral palsy: Secondary | ICD-10-CM | POA: Insufficient documentation

## 2020-11-09 DIAGNOSIS — Z79899 Other long term (current) drug therapy: Secondary | ICD-10-CM | POA: Diagnosis not present

## 2020-11-09 DIAGNOSIS — Z7401 Bed confinement status: Secondary | ICD-10-CM | POA: Diagnosis not present

## 2020-11-09 DIAGNOSIS — K5904 Chronic idiopathic constipation: Secondary | ICD-10-CM

## 2020-11-09 HISTORY — PX: FLEXIBLE SIGMOIDOSCOPY: SHX5431

## 2020-11-09 SURGERY — SIGMOIDOSCOPY, FLEXIBLE
Anesthesia: Monitor Anesthesia Care

## 2020-11-09 MED ORDER — EPHEDRINE SULFATE-NACL 50-0.9 MG/10ML-% IV SOSY
PREFILLED_SYRINGE | INTRAVENOUS | Status: DC | PRN
  Administered 2020-11-09: 10 mg via INTRAVENOUS

## 2020-11-09 MED ORDER — PHENYLEPHRINE 40 MCG/ML (10ML) SYRINGE FOR IV PUSH (FOR BLOOD PRESSURE SUPPORT)
PREFILLED_SYRINGE | INTRAVENOUS | Status: DC | PRN
  Administered 2020-11-09 (×2): 160 ug via INTRAVENOUS
  Administered 2020-11-09: 200 ug via INTRAVENOUS
  Administered 2020-11-09: 80 ug via INTRAVENOUS

## 2020-11-09 MED ORDER — LACTATED RINGERS IV SOLN: INTRAVENOUS | Status: DC | PRN

## 2020-11-09 MED ORDER — PROPOFOL 500 MG/50ML IV EMUL
INTRAVENOUS | Status: DC | PRN
  Administered 2020-11-09: 150 ug/kg/min via INTRAVENOUS

## 2020-11-09 MED ORDER — SODIUM CHLORIDE 0.9 % IV SOLN: INTRAVENOUS | Status: DC

## 2020-11-09 MED ORDER — PROPOFOL 10 MG/ML IV BOLUS
INTRAVENOUS | Status: DC | PRN
  Administered 2020-11-09: 20 mg via INTRAVENOUS

## 2020-11-09 MED ORDER — LACTATED RINGERS IV SOLN: Freq: Once | INTRAVENOUS | Status: AC

## 2020-11-09 NOTE — Discharge Instructions (Signed)

## 2020-11-09 NOTE — Transfer of Care (Signed)
Immediate Anesthesia Transfer of Care Note  Patient: [name]  Procedure(s) Performed: FLEXIBLE SIGMOIDOSCOPY  Patient Location: PACU and Endoscopy Unit  Anesthesia Type:MAC  Level of Consciousness: awake and alert   Airway & Oxygen Therapy: Patient Spontanous Breathing and Patient connected to face mask  Post-op Assessment: Report given to RN and Post -op Vital signs reviewed and stable  Post vital signs: Reviewed and stable  Last Vitals:  Vitals Value Taken Time  BP 127/50 11/09/20 0811  Temp    Pulse 80 11/09/20 0812  Resp 10 11/09/20 0812  SpO2 100 % 11/09/20 0812  Vitals shown include unvalidated device data.  Last Pain:  Vitals:   11/09/20 0723  TempSrc: Oral  PainSc: 0-No pain         Complications: No notable events documented.

## 2020-11-09 NOTE — Op Note (Signed)
[facility] Patient Name: [name] Procedure Date: 11/09/2020 MRN: [id number] Attending MD: [name] MD, MD Date of Birth: 12-08-1960 CSN: [id number] Age: 60 Admit Type: Outpatient Procedure:                Flexible Sigmoidoscopy Indications:              Abnormal CT abdomen and pelvis showing rectal wall                            thickening identified during recent hospitalization                            when he had abdominal pain. Abdominal pain has                            resolved.                           Incidental history of chronic constipation.                           Last full colonoscopy 07/2018 with Dr. Chales Abrahams Providers:                [name] MD, MD, Margaree Mackintosh, RN Referring MD:              Medicines:                Monitored Anesthesia Care Complications:            No immediate complications. Estimated Blood Loss:     Estimated blood loss: none. Procedure:                Pre-Anesthesia Assessment:                           - Prior to the procedure, a History and Physical                            was performed, and patient medications and                            allergies were reviewed. The patient's tolerance of                            previous anesthesia was also reviewed. The risks                            and benefits of the procedure and the sedation                            options and risks were discussed with the patient.                            All questions were answered, and informed consent                            was obtained. Prior  Anticoagulants: The patient has                            taken no previous anticoagulant or antiplatelet                            agents. ASA Grade Assessment: II - A patient with                            mild systemic disease. After reviewing the risks                            and benefits, the patient was deemed in                             satisfactory condition to undergo the procedure.                           After obtaining informed consent, the scope was                            passed under direct vision. The CF-HQ190L ([id number])                            Olympus colonoscope was introduced through the anus                            and advanced to the the descending colon. The                            flexible sigmoidoscopy was accomplished without                            difficulty. The patient tolerated the procedure                            well. The quality of the bowel preparation was good. Scope In: 8:00:11 AM Scope Out: 8:03:29 AM Total Procedure Duration: 0 hours 3 minutes 18 seconds  Findings:      A few diverticula were found in the sigmoid colon.      Non-bleeding internal hemorrhoids were found. The hemorrhoids were small.      The exam was otherwise without abnormality. No mucosal abnormality seen.      The perianal exam findings include a decubitus ulceration. Impression:               - Diverticulosis in the sigmoid colon.                           - Non-bleeding internal hemorrhoids.                           - No rectal abnormalities seen. Moderate Sedation:      Not Applicable - Patient had care per Anesthesia. Recommendation:           - Discharge patient to home.                           -  Resume previous diet.                           - Continue present medications.                           - Continue Senekot 2 daily, Miralax 17 g daily, and                            Dulcolax suppository PRN. May increase Miralax to                            BID PRN for constipation.                           - Continue with management of ducubitus ulcer per                            Dr. Corky Downs. Procedure Code(s):        --- Professional ---                           [phone], Sigmoidoscopy, flexible; diagnostic,                            including collection of specimen(s) by brushing or                             washing, when performed (separate procedure) Diagnosis Code(s):        --- Professional ---                           K64.8, Other hemorrhoids                           K57.30, Diverticulosis of large intestine without                            perforation or abscess without bleeding                           R93.3, Abnormal findings on diagnostic imaging of                            other parts of digestive tract CPT copyright 2019 [employer]. All rights reserved. The codes documented in this report are preliminary and upon coder review may  be revised to meet current compliance requirements. [name] MD, MD 11/09/2020 8:22:44 AM This report has been signed electronically. Number of Addenda: 0

## 2020-11-09 NOTE — Interval H&P Note (Signed)
History and Physical Interval Note:  11/09/2020 7:37 AM  [name]  has presented today for surgery, with the diagnosis of Abnormal CT scan of GI tract; Chronic idiopathic constipation; oropharyngeal dysphagia.  The various methods of treatment have been discussed with the patient and family. After consideration of risks, benefits and other options for treatment, the patient has consented to  Procedure(s): FLEXIBLE SIGMOIDOSCOPY (N/A) as a surgical intervention.  The patient's history has been reviewed, patient examined, no change in status, stable for surgery.  I have reviewed the patient's chart and labs.  Questions were answered to the patient's satisfaction.     [name]

## 2020-11-09 NOTE — Anesthesia Postprocedure Evaluation (Signed)
Anesthesia Post Note  Patient: [name]  Procedure(s) Performed: [place]     Patient location during evaluation: Endoscopy Anesthesia Type: MAC Level of consciousness: awake and alert Pain management: pain level controlled Vital Signs Assessment: post-procedure vital signs reviewed and stable Respiratory status: spontaneous breathing, nonlabored ventilation, respiratory function stable and patient connected to nasal cannula oxygen Cardiovascular status: blood pressure returned to baseline and stable Postop Assessment: no apparent nausea or vomiting Anesthetic complications: no   No notable events documented.  Last Vitals:  Vitals:   11/09/20 0830 11/09/20 0837  BP: 106/71 (!) 113/52  Pulse:    Resp: 18 (!) 21  Temp:    SpO2: 100% 100%    Last Pain:  Vitals:   11/09/20 0837  TempSrc:   PainSc: 3                  Barnet Glasgow

## 2020-11-20 ENCOUNTER — Other Ambulatory Visit: Payer: Self-pay | Admitting: Family Medicine

## 2020-11-25 ENCOUNTER — Emergency Department (HOSPITAL_COMMUNITY)
Admission: EM | Admit: 2020-11-25 | Discharge: 2020-11-26 | Disposition: A | Discharge status: Home / Self Care | Payer: Medicare Other | Attending: Emergency Medicine | Admitting: Emergency Medicine

## 2020-11-25 ENCOUNTER — Encounter (HOSPITAL_COMMUNITY): Payer: Self-pay

## 2020-11-25 ENCOUNTER — Emergency Department (HOSPITAL_COMMUNITY): Payer: Medicare Other

## 2020-11-25 ENCOUNTER — Other Ambulatory Visit: Payer: Self-pay

## 2020-11-25 DIAGNOSIS — K219 Gastro-esophageal reflux disease without esophagitis: Secondary | ICD-10-CM | POA: Insufficient documentation

## 2020-11-25 DIAGNOSIS — R079 Chest pain, unspecified: Secondary | ICD-10-CM | POA: Diagnosis not present

## 2020-11-25 DIAGNOSIS — R0602 Shortness of breath: Secondary | ICD-10-CM | POA: Insufficient documentation

## 2020-11-25 DIAGNOSIS — R059 Cough, unspecified: Secondary | ICD-10-CM | POA: Insufficient documentation

## 2020-11-25 DIAGNOSIS — R1084 Generalized abdominal pain: Secondary | ICD-10-CM | POA: Insufficient documentation

## 2020-11-25 DIAGNOSIS — Z20822 Contact with and (suspected) exposure to covid-19: Secondary | ICD-10-CM | POA: Insufficient documentation

## 2020-11-25 DIAGNOSIS — J189 Pneumonia, unspecified organism: Secondary | ICD-10-CM

## 2020-11-25 LAB — CBC WITH DIFFERENTIAL/PLATELET
Abs Immature Granulocytes: 0.11 10*3/uL — ABNORMAL HIGH (ref 0.00–0.07)
Basophils Absolute: 0.1 10*3/uL (ref 0.0–0.1)
Basophils Relative: 1 %
Eosinophils Absolute: 0 10*3/uL (ref 0.0–0.5)
Eosinophils Relative: 0 %
HCT: 35.6 % — ABNORMAL LOW (ref 39.0–52.0)
Hemoglobin: 10.9 g/dL — ABNORMAL LOW (ref 13.0–17.0)
Immature Granulocytes: 1 %
Lymphocytes Relative: 17 %
Lymphs Abs: 2.1 10*3/uL (ref 0.7–4.0)
MCH: 23.8 pg — ABNORMAL LOW (ref 26.0–34.0)
MCHC: 30.6 g/dL (ref 30.0–36.0)
MCV: 77.7 fL — ABNORMAL LOW (ref 80.0–100.0)
Monocytes Absolute: 1.3 10*3/uL — ABNORMAL HIGH (ref 0.1–1.0)
Monocytes Relative: 11 %
Neutro Abs: 8.6 10*3/uL — ABNORMAL HIGH (ref 1.7–7.7)
Neutrophils Relative %: 70 %
Platelets: 508 10*3/uL — ABNORMAL HIGH (ref 150–400)
RBC: 4.58 MIL/uL (ref 4.22–5.81)
RDW: 18.5 % — ABNORMAL HIGH (ref 11.5–15.5)
WBC: 12.2 10*3/uL — ABNORMAL HIGH (ref 4.0–10.5)
nRBC: 0.2 % (ref 0.0–0.2)

## 2020-11-25 LAB — COMPREHENSIVE METABOLIC PANEL
ALT: 21 U/L (ref 0–44)
AST: 25 U/L (ref 15–41)
Albumin: 2.7 g/dL — ABNORMAL LOW (ref 3.5–5.0)
Alkaline Phosphatase: 118 U/L (ref 38–126)
Anion gap: 10 (ref 5–15)
BUN: 15 mg/dL (ref 6–20)
CO2: 25 mmol/L (ref 22–32)
Calcium: 8.7 mg/dL — ABNORMAL LOW (ref 8.9–10.3)
Chloride: 106 mmol/L (ref 98–111)
Creatinine, Ser: 0.52 mg/dL — ABNORMAL LOW (ref 0.61–1.24)
GFR, Estimated: >60 mL/min (ref >60)
Glucose, Bld: 93 mg/dL (ref 70–99)
Potassium: 4.1 mmol/L (ref 3.5–5.1)
Sodium: 141 mmol/L (ref 135–145)
Total Bilirubin: 0.4 mg/dL (ref 0.3–1.2)
Total Protein: 6.8 g/dL (ref 6.5–8.1)

## 2020-11-25 LAB — RESP PANEL BY RT-PCR (FLU A&B, COVID) ARPGX2
Influenza A by PCR: NEGATIVE
Influenza B by PCR: NEGATIVE
SARS Coronavirus 2 by RT PCR: NEGATIVE

## 2020-11-25 LAB — LIPASE, BLOOD: Lipase: 22 U/L (ref 11–51)

## 2020-11-25 LAB — TROPONIN I (HIGH SENSITIVITY): Troponin I (High Sensitivity): 4 ng/L (ref <18)

## 2020-11-25 MED ORDER — ACETAMINOPHEN 500 MG PO TABS
1000.0000 mg | ORAL_TABLET | Freq: Once | ORAL | Status: AC
  Administered 2020-11-25: 1000 mg via ORAL
  Filled 2020-11-25: qty 2

## 2020-11-25 NOTE — ED Triage Notes (Signed)
BIB EMS from nursing home ([address]). C/o abdominal pain and chest wall pain with non-productive cough that started a few hours ago. Hx of cerebral palsy and MR. A&O at baseline.

## 2020-11-25 NOTE — ED Notes (Signed)
Pt unable to sign MSE due to contractions from cerebral palsy. Pt verbalizes understanding of MSE

## 2020-11-25 NOTE — ED Provider Notes (Signed)
[facility]-EMERGENCY DEPT Provider Note   CSN: [id number] Arrival date & time: 11/25/20  2012  LEVEL 5 CAVEAT - INTELLECTUAL DISABILITY  History No chief complaint on file.   [name] is a 60 y.o. male.  HPI 59 year old male presents with acute painful complaints.  History is taken mostly from the group home staff at the bedside.  The patient has been well up until a few hours ago.  He started complaining of "side pain" and then diffuse pain.  He is saying that both his abdomen and his chest hurt.  He has developed a minimal cough since this started.  Some shortness of breath.  No fevers noted.  No back pain.  He has chronic foot pain that is unchanged.  No meds have been given. Last bowel movement was yesterday.  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   Anemia    microcytic anemia    Anxiety    takes Ativan daily as needed   Bipolar disorder (HCC)    takes Depakote daily   CEREBRAL PALSY 07/24/2006   Chronic bronchitis (HCC)    "gets it q yr"   Chronic bronchitis (HCC)    Chronic diabetic ulcer of foot determined by examination [facility])    Constipation    takes Miralax daily as needed   Depression    takes Risperdal nightly   Environmental allergies    takes Zyrtec daily   GASTRIC ULCER ACUTE WITH HEMORRHAGE 07/24/2006   Gastric ulcer with hemorrhage    hx of    GERD (gastroesophageal reflux disease)    takes Pravacid daily   History of blood transfusion    no abnormal reaction noted   History of hiatal hernia    History of shingles    History of stomach ulcers ~ 1996   Hypercholesterolemia    Intellectual functioning disability    Intermittent explosive disorder    Internal hemorrhoids    MRSA infection 09/06/2019   Obsessive compulsive disorder    Osteomyelitis (HCC)    right foot    Pneumonia "quite a few times"   in 2015 and in June 2016   Pressure ulcer of sacral region    hx of    Pseudomonas  aeruginosa infection 09/06/2019   Psychotic disorder (HCC)    Recurrent UTI (urinary tract infection)    Seborrheic dermatitis    Sigmoid diverticulosis    Spastic tetraplegia (HCC)    URINARY INCONTINENCE 02/09/2010   takes Detrol daily-occasionally incontinence   Vitamin D deficiency    Weakness    numbness in extremities    Patient Active Problem List   Diagnosis Date Noted   Right pulmonary infiltrate on CXR 10/03/2020   Dysphagia 10/03/2020   GERD without esophagitis 10/03/2020   Aspiration pneumonia (HCC) 10/03/2020   Recurrent right pleural effusion 10/02/2020   Abnormal CT scan, colon    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   Nausea and vomiting 07/31/2020   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) 10/08/2019   Osteomyelitis (HCC) 10/08/2019   Acute osteomyelitis of right ankle or foot (HCC)    Ulcerated, foot, right, with necrosis of bone (HCC)    Pseudomonas aeruginosa infection 09/06/2019   MRSA infection 09/06/2019   Lactic acidosis    Stage 3 skin ulcer of sacral region (HCC) 08/10/2018   Gastric wall thickening 08/10/2018   Alkaline phosphatase elevation 08/10/2018   Cholelithiasis 08/10/2018   Microcytic  anemia    Hiatal hernia 04/07/2018   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) 01/20/2016   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/15/2015   Cerebral palsy, quadriplegic (HCC)    Pre-diabetes 08/31/2015   Foot ulcer, limited to breakdown of skin (HCC) 06/08/2015   Seasonal allergies 11/01/2014   Abdominal pain    Pressure ulcer of foot 03/14/2014   CAP (community acquired pneumonia) 03/13/2014   Decubitus ulcer of left hip, stage 3 (HCC) 01/08/2013   URINARY INCONTINENCE 02/09/2010   Mental retardation 07/24/2006   Infantile cerebral palsy (HCC) 07/24/2006    Past Surgical History:  Procedure Laterality Date   AMPUTATION Right 12/14/2014   Procedure: AMPUTATION DIGIT RIGHT GREAT TOE MTP;  Surgeon: Nadara Mustard, MD;  Location: [facility] OR;   Service: Orthopedics;  Laterality: Right;   AMPUTATION Right 10/08/2019   Procedure: RIGHT FIFTH RAY AMPUTATION, ADJACENT TISSUE TRANSFER;  Surgeon: Park Liter, DPM;  Location: [facility] ORS;  Service: Podiatry;  Laterality: Right;   AMPUTATION Right 07/27/2020   Procedure: AMPUTATION BELOW KNEE;  Surgeon: Toni Arthurs, MD;  Location: [facility] OR;  Service: Orthopedics;  Laterality: Right;    BIOPSY  08/12/2018   Procedure: BIOPSY;  Surgeon: Lynann Bologna, MD;  Location: [facility];  Service: Gastroenterology;;   COLONOSCOPY WITH PROPOFOL N/A 08/14/2018   Procedure: COLONOSCOPY WITH PROPOFOL;  Surgeon: Lynann Bologna, MD;  Location: [facility];  Service: Gastroenterology;  Laterality: N/A;   ESOPHAGOGASTRODUODENOSCOPY (EGD) WITH PROPOFOL N/A 08/12/2018   Procedure: ESOPHAGOGASTRODUODENOSCOPY (EGD) WITH PROPOFOL;  Surgeon: Lynann Bologna, MD;  Location: [facility];  Service: Gastroenterology;  Laterality: N/A;   FLEXIBLE SIGMOIDOSCOPY N/A 11/09/2020   Procedure: FLEXIBLE SIGMOIDOSCOPY;  Surgeon: [name], MD;  Location: [facility];  Service: Gastroenterology;  Laterality: N/A;   GRAFT APPLICATION Right 11/12/2019   Procedure: SKIN GRAFT SUBSTITUTE APPLICATION;  Surgeon: Park Liter, DPM;  Location: [facility];  Service: Podiatry;  Laterality: Right;   HERNIA REPAIR     HIATAL HERNIA REPAIR  1982   LEG SURGERY Bilateral ~ 1967   "legs were scissored; stretched them out"   WOUND DEBRIDEMENT Right 11/12/2019   Procedure: DEBRIDEMENT WOUND;  Surgeon: Park Liter, DPM;  Location: [facility];  Service: Podiatry;  Laterality: Right;       Family History  Adopted: Yes  Problem Relation Age of Onset   Colon cancer Neg Hx     Social History   Tobacco Use   Smoking status: Never   Smokeless tobacco: Never  Substance Use Topics   Alcohol use: No   Drug use: No    Home Medications Prior to Admission medications   Medication Sig Start Date End Date Taking? Authorizing Provider   acetaminophen (TYLENOL) 500 MG tablet Take 500 mg by mouth every 6 (six) hours as needed (for aches, pains, and/or fevers).    [provider]  albuterol (VENTOLIN HFA) 108 (90 Base) MCG/ACT inhaler Inhale 2 puffs into the lungs every 4 (four) hours as needed for shortness of breath or wheezing. 01/11/20   [provider]  alum & mag hydroxide-simeth (MAALOX/MYLANTA) 200-200-20 MG/5ML suspension Take 15-30 mLs by mouth every 6 (six) hours as needed for indigestion or heartburn (or nausea).    [provider]  baclofen (LIORESAL) 20 MG tablet Take 1 tablet (20 mg total) by mouth 3 (three) times daily. 07/18/16   Kirsteins, Victorino Sparrow, MD  bisacodyl (DULCOLAX) 10 MG suppository Place 1 suppository (10 mg total) rectally daily as needed  for moderate constipation. 08/03/20   Sheikh, Omair Latif, DO  brompheniramine-pseudoephedrine (DIMETAPP) 1-15 MG/5ML ELIX Take 10 mLs by mouth every 8 (eight) hours as needed (for cold symptoms).    [provider]  cetirizine (ZYRTEC) 10 MG tablet Take 1 tablet (10 mg total) by mouth daily. 11/01/14   Street, Stephanie Coup, MD  Cholecalciferol (VITAMIN D3) 5000 units CAPS Take 5,000 Units by mouth in the morning. (0700)    [provider]  clotrimazole (LOTRIMIN) 1 % cream Apply 1 application topically 2 (two) times daily. (0700 & 1800)    [provider]  clotrimazole-betamethasone (LOTRISONE) cream Apply 1 application topically 2 (two) times daily. (0800 & 2000)    [provider]  divalproex (DEPAKOTE SPRINKLE) 125 MG capsule Take 500 mg by mouth 2 (two) times daily. (0700 & 2000)    [provider]  FERREX 150 150 MG capsule Take 150 mg by mouth daily at 12 noon.    [provider]  fluticasone (FLONASE) 50 MCG/ACT nasal spray Place 1 spray into both nostrils daily. (0700) 12/21/19   [provider]  Foot Care Products (PREVALON HEEL PROTECTOR) MISC 1 Device by Does not apply route  daily. 06/20/15   Marquette Saa, MD  guaiFENesin-dextromethorphan [facility] DM) 100-10 MG/5ML syrup Take 10 mLs by mouth every 4 (four) hours as needed for cough.     [provider]  hydrocortisone cream 1 % APPLY TOPICALLY AT BEDTIME AS NEEDED FOR ITCHING 05/29/20   Mullis, Kiersten P, DO  ketoconazole (NIZORAL) 2 % shampoo APPLY TOPICALLY TO AFFECTED AREA(S) TWICE WEEKLY AS DIRECTED 12/22/19   Mullis, Kiersten P, DO  Lido-PE-Glycerin-Petrolatum (PREPARATION H RAPID RELIEF) 5-0.25-14.4-15 % CREA Apply 1 application topically See admin instructions. Apply a thin layer topically to the affected area 2 times a day for residual hemorrhoidal skin tag    [provider]  LORazepam (ATIVAN) 2 MG tablet Take 1 mg by mouth every 4 (four) hours as needed for anxiety.    [provider]  magnesium citrate SOLN Use as directed 11/06/20   [name], MD  Neomycin-Bacitracin-Polymyxin (TRIPLE ANTIBIOTIC EX) Apply 1 application topically daily as needed (cuts/scrapes/scratches).    [provider]  omeprazole (PRILOSEC) 40 MG capsule Take 1 capsule (40 mg total) by mouth 2 (two) times daily. 08/15/18   Rodolph Bong, MD  ondansetron (ZOFRAN) 4 MG tablet Take 1 tablet (4 mg total) by mouth every 6 (six) hours as needed for nausea. 08/03/20   Sheikh, Omair Latif, DO  Oyster Shell (OYSTER CALCIUM) 500 MG TABS tablet Take 1,500 mg of elemental calcium by mouth daily.    [provider]  phenylephrine-shark liver oil-mineral oil-petrolatum (PREPARATION H) 0.25-14-74.9 % rectal ointment Place 1 application rectally 2 (two) times daily.    [provider]  risperiDONE (RISPERDAL) 2 MG tablet Take 2 mg by mouth at bedtime. (2100)    [provider]  senna-docusate (SENOKOT-S) 8.6-50 MG tablet Take 1 tablet by mouth at bedtime. 10/06/20   Ghimire, Werner Lean, MD  sodium phosphate (FLEET) 7-19 GM/118ML ENEM Use as directed 11/06/20   [name], MD  tamsulosin (FLOMAX) 0.4 MG CAPS capsule TAKE 1 CAPSULE BY MOUTH EVERY DAY *DO NOT CRUSH* 06/19/20   Mullis, Kiersten P, DO  tolterodine (DETROL LA) 2 MG 24 hr capsule Take 1 capsule (2 mg total) by mouth daily. 04/08/13   Street, Stephanie Coup, MD  tretinoin (RETIN-A) 0.025 % gel Apply 1 application topically  See admin instructions. Apply to the affected areas of the face at bedtime    [provider]  vitamin C (VITAMIN C) 250 MG tablet Take 1 tablet (250 mg total) by mouth daily. 01/18/16   Palma HolterGunadasa, Kanishka G, MD  VOLTAREN 1 % GEL APPLY 4 GRAMS TOPICALLY TO AFFECTED AREA(S) FOUR TIMES DAILY 06/16/19   Mullis, Kiersten P, DO  Zinc Oxide 40 % PSTE Apply 1 application topically 2 (two) times daily as needed (unspecified). Desitin Max strength paste apply thin layer topically to buttocks and scrotum 06/07/20   McDiarmid, Leighton Roachodd D, MD    Allergies    Adhesive [tape]  Review of Systems   Review of Systems  Unable to perform ROS: Other   Physical Exam Updated Vital Signs BP 113/61   Pulse 88   Temp 98.3 F (36.8 C) (Oral)   Resp 14   Ht 6\' 2"  (1.88 m)   Wt 69 kg   SpO2 98%   BMI 19.53 kg/m   Physical Exam Vitals and nursing note reviewed.  Constitutional:      General: He is not in acute distress.    Appearance: He is well-developed. He is not ill-appearing or diaphoretic.     Comments: Significant diffuse contractures  HENT:     Head: Normocephalic and atraumatic.     Right Ear: External ear normal.     Left Ear: External ear normal.     Nose: Nose normal.  Eyes:     General:        Right eye: No discharge.        Left eye: No discharge.  Cardiovascular:     Rate and Rhythm: Normal rate and regular rhythm.     Heart sounds: Normal heart sounds.  Pulmonary:     Effort: Pulmonary effort is normal.     Breath sounds: Normal breath sounds. No wheezing, rhonchi or rales.  Abdominal:     Palpations: Abdomen is soft.     Tenderness: There is abdominal  tenderness (mild, generalized).  Musculoskeletal:     Cervical back: Neck supple.  Skin:    General: Skin is warm and dry.  Neurological:     Mental Status: He is alert.  Psychiatric:        Mood and Affect: Mood is not anxious.    ED Results / Procedures / Treatments   Labs (all labs ordered are listed, but only abnormal results are displayed) Labs Reviewed  CBC WITH DIFFERENTIAL/PLATELET - Abnormal; Notable for the following components:      Result Value   WBC 12.2 (*)    Hemoglobin 10.9 (*)    HCT 35.6 (*)    MCV 77.7 (*)    MCH 23.8 (*)    RDW 18.5 (*)    Platelets 508 (*)    Neutro Abs 8.6 (*)    Monocytes Absolute 1.3 (*)    Abs Immature Granulocytes 0.11 (*)    All other components within normal limits  RESP PANEL BY RT-PCR (FLU A&B, COVID) [id number]  COMPREHENSIVE METABOLIC PANEL  LIPASE, BLOOD  URINALYSIS, ROUTINE W REFLEX MICROSCOPIC  TROPONIN I (HIGH SENSITIVITY)  TROPONIN I (HIGH SENSITIVITY)    EKG EKG Interpretation  Date/Time:  Saturday November 25 2020 21:45:44 EDT Ventricular Rate:  90 PR Interval:  124 QRS Duration: 65 QT Interval:  480 QTC Calculation: 588 R Axis:   49 Text Interpretation: Sinus rhythm Probable left atrial enlargement Abnormal R-wave progression, early transition Borderline abnrm T, anterolateral leads  Prolonged QT interval similar to May 2022 Confirmed by Pricilla Loveless [phone]) on 11/25/2020 9:53:31 PM  Radiology DG Chest Portable 1 View  Result Date: 11/25/2020 CLINICAL DATA:  Cough with chest and abdominal pain. EXAM: PORTABLE CHEST 1 VIEW COMPARISON:  10/22/2020 FINDINGS: Examination is technically limited due to patient positioning and body habitus. Severe patient rotation. There appears to be shallow inspiration. Cardiac enlargement. Probable right perihilar infiltration. This could indicate pneumonia. No definite pleural effusion or pneumothorax. IMPRESSION: Shallow inspiration.  Probable right perihilar infiltrate. Electronically  Signed   By: Burman Nieves M.D.   On: 11/25/2020 23:05    Procedures Procedures   Medications Ordered in ED Medications  acetaminophen (TYLENOL) tablet 1,000 mg (1,000 mg Oral Given 11/25/20 2157)    ED Course  I have reviewed the triage vital signs and the nursing notes.  Pertinent labs & imaging results that were available during my care of the patient were reviewed by me and considered in my medical decision making (see chart for details).    MDM Rules/Calculators/A&P                          Patient is in no acute distress.  Unfortunately he has a lot of vague discomfort everywhere.  Previous x-rays and scans have shown frequent perihilar infiltrate on the right side so not sure this x-ray finding is a new pneumonia.  Ultimately he will probably need CTs though I am waiting to see with the lab work shows to help guide imaging process.  Care transferred to Dr. Bebe Shaggy. Final Clinical Impression(s) / ED Diagnoses Final diagnoses:  None    Rx / DC Orders ED Discharge Orders     None        Pricilla Loveless, MD 11/25/20 2316

## 2020-11-26 DIAGNOSIS — R1084 Generalized abdominal pain: Secondary | ICD-10-CM | POA: Diagnosis not present

## 2020-11-26 LAB — URINALYSIS, ROUTINE W REFLEX MICROSCOPIC
Bacteria, UA: NONE SEEN
Bilirubin Urine: NEGATIVE
Glucose, UA: >=500 mg/dL — AB
Hgb urine dipstick: NEGATIVE
Ketones, ur: 5 mg/dL — AB
Leukocytes,Ua: NEGATIVE
Nitrite: NEGATIVE
Protein, ur: NEGATIVE mg/dL
Specific Gravity, Urine: 1.023 (ref 1.005–1.030)
pH: 6 (ref 5.0–8.0)

## 2020-11-26 MED ORDER — AMOXICILLIN-POT CLAVULANATE 875-125 MG PO TABS: 1.0000 | ORAL_TABLET | Freq: Two times a day (BID) | ORAL | 0 refills | Status: DC

## 2020-11-26 MED ORDER — SODIUM CHLORIDE 0.9 % IV SOLN
1.0000 g | Freq: Once | INTRAVENOUS | Status: AC
  Administered 2020-11-26: 1 g via INTRAVENOUS
  Filled 2020-11-26: qty 10

## 2020-11-26 NOTE — ED Notes (Signed)
[facility] called for pt transport. 

## 2020-11-26 NOTE — ED Provider Notes (Signed)
I assumed care in signout to follow-up on labs and reassess.  Patient denies any complaints.  He has no focal abdominal tenderness on my exam.  He has no hypoxia on room air.  He appears to have recurrent pneumonia, though in the past it has been suspicious for aspiration pneumonia.  He has been placed on Augmentin on previous discharges. Apparently he can take medications if it is crushed up for him at the nursing facility. Discussed this with the patient and his caregiver at bedside He was given Rocephin here.  Since his symptoms are improving and his vitals are appropriate, will defer CT imaging for now    Zadie Rhine, MD 11/26/20 [phone]

## 2020-11-29 ENCOUNTER — Other Ambulatory Visit: Payer: Self-pay

## 2020-11-29 ENCOUNTER — Other Ambulatory Visit: Payer: Medicare Other | Admitting: Student

## 2020-11-29 DIAGNOSIS — R531 Weakness: Secondary | ICD-10-CM

## 2020-11-29 DIAGNOSIS — R059 Cough, unspecified: Secondary | ICD-10-CM

## 2020-11-29 DIAGNOSIS — R0602 Shortness of breath: Secondary | ICD-10-CM

## 2020-11-29 DIAGNOSIS — Z515 Encounter for palliative care: Secondary | ICD-10-CM

## 2020-11-29 DIAGNOSIS — J189 Pneumonia, unspecified organism: Secondary | ICD-10-CM

## 2020-11-29 DIAGNOSIS — S31000D Unspecified open wound of lower back and pelvis without penetration into retroperitoneum, subsequent encounter: Secondary | ICD-10-CM

## 2020-11-29 NOTE — Progress Notes (Signed)
[facility] Palliative Care Consult Note Telephone: [phone]  Fax: [phone]   Date of encounter: 11/29/20 PATIENT NAME: [name] [address] [place] [address]   [phone] (home)  DOB: September 09, 1960 MRN: [id number] PRIMARY CARE PROVIDER:    Audley Hose, MD,  [place] [address] [phone]  REFERRING PROVIDER:   Audley Hose, MD [address] Duanne Moron [place],  [place] [address] [phone]  RESPONSIBLE PARTY:    Contact Information     Name Relation Home Work Mobile   [place] Sister [phone]   Hanish, Laraia   [phone]        I met face to face with patient in the group home. Palliative Care was asked to follow this patient by consultation request of  Audley Hose, MD to address advance care planning and complex medical decision making. This is the initial visit.                                     ASSESSMENT AND PLAN / RECOMMENDATIONS:   Advance Care Planning/Goals of Care: Goals include to maximize quality of life and symptom management. Our advance care planning conversation included a discussion about:    The value and importance of advance care planning  Experiences with loved ones who have been seriously ill or have died  Exploration of personal, cultural or spiritual beliefs that might influence medical decisions  Exploration of goals of care in the event of a sudden injury or illness  Identification and preparation of a healthcare agent  Introduced MOST form; reviewed with patient, awaiting return call from responsible party to complete CODE STATUS: DNR  Discussed palliative medicine vs. Hospice services. We discussed his recurrent aspiration, CAP, recent ED/hospitalizations. Patient states he will continue pureed/modified diet. We discussed MOST form; he is open to going to hospital as needed. Open to  antibiotics and IV fluids as indicated. He states he would trial a feeding tube if it would help him.   Left messages for responsible party/legal guardian, sister [name]. Awaiting return call. Also left message for home director, [place].  Symptom Management/Plan:  CAP-suspicion of aspiration pneumonia. Continue Augmentin through 12/03/20 BID as directed. Albuterol inhaler 2 puffs every 4 hours PRN shortness of breath/wheezing. Cough-continue Robitussin DM 10 ml every 4 hours PRN cough.  Generalized weakness-secondary to cerebral palsy, quadriplegia, recent CAP. Caregivers to assist with adl's. Continue therapy as directed. Patient encouraged to be out of bed as tolerated.   Dysphagia-patient with recurrent aspiration pneumonia. Recommend continuing modified diet/pureed diet. Education provided on aspiration precautions.   Wounds-patient with chronic wounds. Sacral wound-stage 3. Continue daily wound care with santyl, foam dressing. HH SN for wound care; pending wound center referral. Superficial wound to right hip; area appears to be sheared. Superficial wounds also present to left medial leg, right posterior knee, continue foam dressings as directed. Left foot wound, dressing/wound care as directed.     Follow up Palliative Care Visit: Palliative care will continue to follow for complex medical decision making, advance care planning, and clarification of goals. Return in 8 weeks or prn.  I spent 60 minutes providing this consultation. More than 50% of the time in this consultation was spent in counseling and care coordination.    PPS: 30%  HOSPICE ELIGIBILITY/DIAGNOSIS: TBD  Chief Complaint: Palliative Medicine initial consult.  HISTORY OF PRESENT ILLNESS:  [name] is a 60 y.o. year old male  with cerebral palsy, quadriplegia, microcytic anemia, anxiety, bipolar disorder, hx of right BKA, osteomyelitis with chronic ulcer. Patient with multiple ED visits, hospital admissions due to  CAP, aspiration pneumonia, recurrent right pleural effusion. Most recent ED visit on 11/25/20 with CAP, suspicion of aspiration pneumonia.  Mr. Chesnut currently resides at a group home. He denies pain. He endorses occasional cough, shortness of breath. He states his appetite has not been as good due to modified diet. Patient with chronic wounds. He has [facility] for wound care. Patient and caregiver state arrangements have been made to go to wound center due to worsening of sacral wound. Patient is out of bed daily to w/c. He is fed by staff.    Patient received resting in bed. Pleasant affect. He is able to answer questions appropriately. He states he has been at group home for over 20 years and wants to stay there for as long as possible. Right BKA. Sacral wound, appears to be a stage 3. Some depth, small amount of serosanguinous drainage present. He also has superficial wound to right hip; area appears to be sheared. Superficial wounds also present to left medial leg, right posterior knee. Dressing present to left foot, dressing CDI, wound not visulalized.   History obtained from review of EMR, discussion with primary team, and interview with family, facility staff/caregiver and/or Mr. Kohles.  I reviewed available labs, medications, imaging, studies and related documents from the EMR.  Records reviewed and summarized above.   ROS  General: NAD EYES: denies vision changes ENMT: dysphagia Cardiovascular: denies chest pain, denies DOE Pulmonary: occasional cough, denies increased SOB Abdomen: endorses fair appetite, denies constipation GU: denies dysuria MSK: weakness,  no falls reported Skin: denies rashes or wounds Neurological: denies pain, denies insomnia Psych: Endorses stable mood Heme/lymph/immuno: denies bruises, abnormal bleeding  Physical Exam: Pulse 82, resp 20, sats 97% on room air Constitutional: NAD General: frail appearing EYES: anicteric sclera, lids intact, no discharge   ENMT: intact hearing, oral mucous membranes moist CV: S1S2, RRR, no LE edema Pulmonary: left lobes clear, rhonchi to right upper lobe, no increased work of breathing, no cough Abdomen:  normo-active BS + 4 quadrants, soft and non tender GU: deferred MSK: non-ambulatory Skin: warm and dry, see HPI Neuro: generalized weakness, A & O x 3 Psych: non-anxious affect Hem/lymph/immuno: no widespread bruising CURRENT PROBLEM LIST:  Patient Active Problem List   Diagnosis Date Noted   Right pulmonary infiltrate on CXR 10/03/2020   Dysphagia 10/03/2020   GERD without esophagitis 10/03/2020   Aspiration pneumonia ([place]) 10/03/2020   Recurrent right pleural effusion 10/02/2020   Abnormal CT scan, colon    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   Nausea and vomiting 07/31/2020   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 ([place]) 10/08/2019   Osteomyelitis ([place]) 10/08/2019   Acute osteomyelitis of right ankle or foot (HCC)    Ulcerated, foot, right, with necrosis of bone (HCC)    Pseudomonas aeruginosa infection 09/06/2019   MRSA infection 09/06/2019   Lactic acidosis    Stage 3 skin ulcer of sacral region ([place]) 08/10/2018   Gastric wall thickening 08/10/2018   Alkaline phosphatase elevation 08/10/2018   Cholelithiasis 08/10/2018   Microcytic anemia    Hiatal hernia 04/07/2018   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 ([place]) 01/20/2016   Functional quadriplegia ([place]) 12/15/2015   Cerebral palsy, quadriplegic ([place])  Pre-diabetes 08/31/2015   Foot ulcer, limited to breakdown of skin ([place]) 06/08/2015   Seasonal allergies 11/01/2014   Abdominal pain    Pressure ulcer of foot 03/14/2014   CAP (community acquired pneumonia) 03/13/2014   Decubitus ulcer of left hip, stage 3 ([place]) 01/08/2013   URINARY INCONTINENCE 02/09/2010   Mental retardation 07/24/2006   Infantile cerebral palsy ([place]) 07/24/2006   PAST MEDICAL HISTORY:  Active Ambulatory Problems    Diagnosis Date Noted   Mental  retardation 07/24/2006   Infantile cerebral palsy ([place]) 07/24/2006   URINARY INCONTINENCE 02/09/2010   Decubitus ulcer of left hip, stage 3 ([place]) 01/08/2013   CAP (community acquired pneumonia) 03/13/2014   Pressure ulcer of foot 03/14/2014   Abdominal pain    Seasonal allergies 11/01/2014   Foot ulcer, limited to breakdown of skin ([place]) 06/08/2015   Pre-diabetes 08/31/2015   Cerebral palsy, quadriplegic (HCC)    Functional quadriplegia ([place]) 12/15/2015   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) 01/20/2016   Hiatal hernia 04/07/2018   Stage 3 skin ulcer of sacral region ([place]) 08/10/2018   Gastric wall thickening 08/10/2018   Alkaline phosphatase elevation 08/10/2018   Cholelithiasis 08/10/2018   Microcytic anemia    Lactic acidosis    Pseudomonas aeruginosa infection 09/06/2019   MRSA infection 09/06/2019   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 ([place]) 10/08/2019   Acute osteomyelitis of right ankle or foot ([place])    Ulcerated, foot, right, with necrosis of bone ([place])    Osteomyelitis ([place]) 10/08/2019   Nausea and vomiting 07/31/2020   Hx of right BKA ([place]) 09/05/2020   Abnormal CT scan, colon    Recurrent right pleural effusion 10/02/2020   Right pulmonary infiltrate on CXR 10/03/2020   Dysphagia 10/03/2020   GERD without esophagitis 10/03/2020   Aspiration pneumonia ([place]) 10/03/2020   Resolved Ambulatory Problems    Diagnosis Date Noted   Renal glycosuria ([place]) 02/26/2010   GASTRIC ULCER ACUTE WITH HEMORRHAGE 07/24/2006   ECZEMA 06/09/2008   ACNE VULGARIS 07/31/2007   Dysuria 02/09/2010   Person living in residential institution 07/05/2009   Hyperlipidemia 07/02/2010   Leg ulcer ([place]) 12/21/2010   Cellulitis 12/21/2010   Intertrigo 02/22/2011   Medication monitoring encounter 07/10/2011   Foot ulceration ([place]) 07/10/2011   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[place]) 08/01/2011   Cellulitis 03/11/2012   Rash 06/30/2012   Left foot wound and infection 01/08/2013    Onychomycosis 01/13/2013   Fracture of fourth toe, left, open 01/13/2013   Neuropathy 01/13/2013   DJD of shoulder 02/25/2013   Healthcare maintenance 06/24/2013   Cellulitis of right foot 10/27/2013   Knee pain 11/23/2013   Community acquired pneumonia 03/13/2014   Cough 10/14/2014   Colitis 10/15/2014   Pancolitis ([place]) 10/15/2014   Abdominal distension    Toe pain, right    Chronic osteomyelitis (HCC)    Constipation 11/01/2014   Osteomyelitis of toe of right foot ([place]) 12/14/2014   Cellulitis 06/08/2015   Stage III pressure ulcer of left ankle ([place]) 08/31/2015   Spiral fracture of shaft of tibia 10/19/2015   Cellulitis and abscess    Fracture of distal end of tibia    Cellulitis of left lower extremity    Venous stasis ulcer of ankle ([place])    Periorbital cellulitis of right eye 11/10/2015   Cellulitis 01/08/2016   Palliative care encounter    Left ankle pain    Shingles    Personal history of gastric ulcer 01/20/2016   Tinea  corporis 02/04/2017   Cerumen impaction 02/04/2017   Screening for lipid disorders 06/04/2018   Encounter for completion of form with patient 06/04/2018   Colon cancer screening    AKI (acute kidney injury) ([place]) 04/08/2019   Acute renal failure ([place])    Hypokalemia    Diarrhea    Acute renal failure (ARF) ([place]) 04/08/2019   Urinary retention    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   Anemia    Anxiety    Bipolar disorder ([place])    CEREBRAL PALSY 07/24/2006   Chronic bronchitis (HCC)    Chronic bronchitis (HCC)    Chronic diabetic ulcer of foot determined by examination ([place])    Depression    Environmental allergies    Gastric ulcer with hemorrhage    GERD (gastroesophageal reflux disease)    History of blood transfusion    History of hiatal hernia    History of shingles    History of stomach ulcers ~ 1996   Hypercholesterolemia    Intellectual functioning disability    Intermittent explosive disorder    Internal  hemorrhoids    Obsessive compulsive disorder    Pneumonia "quite a few times"   Pressure ulcer of sacral region    Psychotic disorder ([place])    Recurrent UTI (urinary tract infection)    Seborrheic dermatitis    Sigmoid diverticulosis    Spastic tetraplegia (HCC)    Vitamin D deficiency    Weakness    SOCIAL HX:  Social History   Tobacco Use   Smoking status: Never   Smokeless tobacco: Never  Substance Use Topics   Alcohol use: No   FAMILY HX:  Family History  Adopted: Yes  Problem Relation Age of Onset   Colon cancer Neg Hx     ALLERGIES:  Allergies  Allergen Reactions   Adhesive [Tape] Rash     PERTINENT MEDICATIONS:  Outpatient Encounter Medications as of 11/29/2020  Medication Sig   acetaminophen (TYLENOL) 500 MG tablet Take 500 mg by mouth every 6 (six) hours as needed (for aches, pains, and/or fevers).   albuterol (VENTOLIN HFA) 108 (90 Base) MCG/ACT inhaler Inhale 2 puffs into the lungs every 4 (four) hours as needed for shortness of breath or wheezing.   alum & mag hydroxide-simeth (MAALOX/MYLANTA) 200-200-20 MG/5ML suspension Take 15-30 mLs by mouth every 6 (six) hours as needed for indigestion or heartburn (or nausea).   amoxicillin-clavulanate (AUGMENTIN) 875-125 MG tablet Take 1 tablet by mouth 2 (two) times daily. One po bid x 7 days   baclofen (LIORESAL) 20 MG tablet Take 1 tablet (20 mg total) by mouth 3 (three) times daily.   bisacodyl (DULCOLAX) 10 MG suppository Place 1 suppository (10 mg total) rectally daily as needed for moderate constipation.   brompheniramine-pseudoephedrine (DIMETAPP) 1-15 MG/5ML ELIX Take 10 mLs by mouth every 8 (eight) hours as needed (for cold symptoms).   cetirizine (ZYRTEC) 10 MG tablet Take 1 tablet (10 mg total) by mouth daily.   Cholecalciferol (VITAMIN D3) 5000 units CAPS Take 5,000 Units by mouth in the morning. (0700)   clotrimazole (LOTRIMIN) 1 % cream Apply 1 application topically 2 (two) times daily. (0700 & 1800)    clotrimazole-betamethasone (LOTRISONE) cream Apply 1 application topically 2 (two) times daily. (0800 & 2000)   divalproex (DEPAKOTE SPRINKLE) 125 MG capsule Take 500 mg by mouth 2 (two) times daily. (0700 & 2000)   FERREX 150 150 MG capsule Take 150 mg by mouth daily at  12 noon.   fluticasone (FLONASE) 50 MCG/ACT nasal spray Place 1 spray into both nostrils daily. (0700)   Foot Care Products (PREVALON HEEL PROTECTOR) MISC 1 Device by Does not apply route daily.   guaiFENesin-dextromethorphan (ROBITUSSIN DM) 100-10 MG/5ML syrup Take 10 mLs by mouth every 4 (four) hours as needed for cough.    hydrocortisone cream 1 % APPLY TOPICALLY AT BEDTIME AS NEEDED FOR ITCHING   ketoconazole (NIZORAL) 2 % shampoo APPLY TOPICALLY TO AFFECTED AREA(S) TWICE WEEKLY AS DIRECTED   Lido-PE-Glycerin-Petrolatum (PREPARATION H RAPID RELIEF) 5-0.25-14.4-15 % CREA Apply 1 application topically See admin instructions. Apply a thin layer topically to the affected area 2 times a day for residual hemorrhoidal skin tag   LORazepam (ATIVAN) 2 MG tablet Take 1 mg by mouth every 4 (four) hours as needed for anxiety.   magnesium citrate SOLN Use as directed   Neomycin-Bacitracin-Polymyxin (TRIPLE ANTIBIOTIC EX) Apply 1 application topically daily as needed (cuts/scrapes/scratches).   omeprazole (PRILOSEC) 40 MG capsule Take 1 capsule (40 mg total) by mouth 2 (two) times daily.   ondansetron (ZOFRAN) 4 MG tablet Take 1 tablet (4 mg total) by mouth every 6 (six) hours as needed for nausea.   Oyster Shell (OYSTER CALCIUM) 500 MG TABS tablet Take 1,500 mg of elemental calcium by mouth daily.   phenylephrine-shark liver oil-mineral oil-petrolatum (PREPARATION H) 0.25-14-74.9 % rectal ointment Place 1 application rectally 2 (two) times daily.   risperiDONE (RISPERDAL) 2 MG tablet Take 2 mg by mouth at bedtime. (2100)   senna-docusate (SENOKOT-S) 8.6-50 MG tablet Take 1 tablet by mouth at bedtime.   sodium phosphate (FLEET) 7-19 GM/118ML  ENEM Use as directed   tamsulosin (FLOMAX) 0.4 MG CAPS capsule TAKE 1 CAPSULE BY MOUTH EVERY DAY *DO NOT CRUSH*   tolterodine (DETROL LA) 2 MG 24 hr capsule Take 1 capsule (2 mg total) by mouth daily.   tretinoin (RETIN-A) 0.025 % gel Apply 1 application topically See admin instructions. Apply to the affected areas of the face at bedtime   vitamin C (VITAMIN C) 250 MG tablet Take 1 tablet (250 mg total) by mouth daily.   VOLTAREN 1 % GEL APPLY 4 GRAMS TOPICALLY TO AFFECTED AREA(S) FOUR TIMES DAILY   Zinc Oxide 40 % PSTE Apply 1 application topically 2 (two) times daily as needed (unspecified). Desitin Max strength paste apply thin layer topically to buttocks and scrotum   No facility-administered encounter medications on file as of 11/29/2020.   Thank you for the opportunity to participate in the care of Mr. Ransome.  The palliative care team will continue to follow. Please call our office at [phone] if we can be of additional assistance.   Ezekiel Slocumb, NP   COVID-19 PATIENT SCREENING TOOL Asked and negative response unless otherwise noted:  Have you had symptoms of covid, tested positive or been in contact with someone with symptoms/positive test in the past 5-10 days? No

## 2020-12-06 ENCOUNTER — Other Ambulatory Visit: Payer: Self-pay

## 2020-12-06 ENCOUNTER — Inpatient Hospital Stay (HOSPITAL_COMMUNITY)
Admission: EM | Admit: 2020-12-06 | Discharge: 2020-12-16 | DRG: 579 | Disposition: A | Discharge status: Skilled Nursing Facility | Payer: Medicare Other | Source: Skilled Nursing Facility | Attending: Internal Medicine | Admitting: Internal Medicine

## 2020-12-06 ENCOUNTER — Encounter (HOSPITAL_COMMUNITY): Payer: Self-pay

## 2020-12-06 ENCOUNTER — Emergency Department (HOSPITAL_COMMUNITY): Payer: Medicare Other

## 2020-12-06 DIAGNOSIS — Z91048 Other nonmedicinal substance allergy status: Secondary | ICD-10-CM

## 2020-12-06 DIAGNOSIS — G808 Other cerebral palsy: Secondary | ICD-10-CM | POA: Diagnosis present

## 2020-12-06 DIAGNOSIS — F79 Unspecified intellectual disabilities: Secondary | ICD-10-CM | POA: Diagnosis present

## 2020-12-06 DIAGNOSIS — L03317 Cellulitis of buttock: Secondary | ICD-10-CM | POA: Diagnosis present

## 2020-12-06 DIAGNOSIS — B9562 Methicillin resistant Staphylococcus aureus infection as the cause of diseases classified elsewhere: Secondary | ICD-10-CM | POA: Diagnosis present

## 2020-12-06 DIAGNOSIS — Z79899 Other long term (current) drug therapy: Secondary | ICD-10-CM | POA: Diagnosis not present

## 2020-12-06 DIAGNOSIS — Z89421 Acquired absence of other right toe(s): Secondary | ICD-10-CM | POA: Diagnosis not present

## 2020-12-06 DIAGNOSIS — D75839 Thrombocytosis, unspecified: Secondary | ICD-10-CM | POA: Diagnosis present

## 2020-12-06 DIAGNOSIS — L89154 Pressure ulcer of sacral region, stage 4: Secondary | ICD-10-CM | POA: Diagnosis present

## 2020-12-06 DIAGNOSIS — K5900 Constipation, unspecified: Secondary | ICD-10-CM | POA: Diagnosis present

## 2020-12-06 DIAGNOSIS — B965 Pseudomonas (aeruginosa) (mallei) (pseudomallei) as the cause of diseases classified elsewhere: Secondary | ICD-10-CM | POA: Diagnosis present

## 2020-12-06 DIAGNOSIS — L89892 Pressure ulcer of other site, stage 2: Secondary | ICD-10-CM | POA: Diagnosis present

## 2020-12-06 DIAGNOSIS — E7800 Pure hypercholesterolemia, unspecified: Secondary | ICD-10-CM | POA: Diagnosis present

## 2020-12-06 DIAGNOSIS — E559 Vitamin D deficiency, unspecified: Secondary | ICD-10-CM | POA: Diagnosis present

## 2020-12-06 DIAGNOSIS — Z86718 Personal history of other venous thrombosis and embolism: Secondary | ICD-10-CM | POA: Diagnosis not present

## 2020-12-06 DIAGNOSIS — J90 Pleural effusion, not elsewhere classified: Secondary | ICD-10-CM | POA: Diagnosis present

## 2020-12-06 DIAGNOSIS — K219 Gastro-esophageal reflux disease without esophagitis: Secondary | ICD-10-CM | POA: Diagnosis present

## 2020-12-06 DIAGNOSIS — L089 Local infection of the skin and subcutaneous tissue, unspecified: Principal | ICD-10-CM

## 2020-12-06 DIAGNOSIS — T148XXA Other injury of unspecified body region, initial encounter: Secondary | ICD-10-CM

## 2020-12-06 DIAGNOSIS — E875 Hyperkalemia: Secondary | ICD-10-CM | POA: Diagnosis present

## 2020-12-06 DIAGNOSIS — Z20822 Contact with and (suspected) exposure to covid-19: Secondary | ICD-10-CM | POA: Diagnosis present

## 2020-12-06 DIAGNOSIS — L89623 Pressure ulcer of left heel, stage 3: Secondary | ICD-10-CM | POA: Diagnosis present

## 2020-12-06 DIAGNOSIS — Z89411 Acquired absence of right great toe: Secondary | ICD-10-CM | POA: Diagnosis not present

## 2020-12-06 DIAGNOSIS — R7881 Bacteremia: Secondary | ICD-10-CM | POA: Diagnosis present

## 2020-12-06 DIAGNOSIS — L89150 Pressure ulcer of sacral region, unstageable: Secondary | ICD-10-CM | POA: Diagnosis not present

## 2020-12-06 DIAGNOSIS — L03319 Cellulitis of trunk, unspecified: Secondary | ICD-10-CM | POA: Diagnosis present

## 2020-12-06 DIAGNOSIS — Z89511 Acquired absence of right leg below knee: Secondary | ICD-10-CM | POA: Diagnosis not present

## 2020-12-06 DIAGNOSIS — Z7401 Bed confinement status: Secondary | ICD-10-CM | POA: Diagnosis not present

## 2020-12-06 LAB — CBC WITH DIFFERENTIAL/PLATELET
Abs Immature Granulocytes: 0.13 10*3/uL — ABNORMAL HIGH (ref 0.00–0.07)
Basophils Absolute: 0.1 10*3/uL (ref 0.0–0.1)
Basophils Relative: 1 %
Eosinophils Absolute: 0.1 10*3/uL (ref 0.0–0.5)
Eosinophils Relative: 1 %
HCT: 37.5 % — ABNORMAL LOW (ref 39.0–52.0)
Hemoglobin: 11.6 g/dL — ABNORMAL LOW (ref 13.0–17.0)
Immature Granulocytes: 1 %
Lymphocytes Relative: 16 %
Lymphs Abs: 2.1 10*3/uL (ref 0.7–4.0)
MCH: 23.9 pg — ABNORMAL LOW (ref 26.0–34.0)
MCHC: 30.9 g/dL (ref 30.0–36.0)
MCV: 77.3 fL — ABNORMAL LOW (ref 80.0–100.0)
Monocytes Absolute: 1.3 10*3/uL — ABNORMAL HIGH (ref 0.1–1.0)
Monocytes Relative: 11 %
Neutro Abs: 8.9 10*3/uL — ABNORMAL HIGH (ref 1.7–7.7)
Neutrophils Relative %: 70 %
Platelets: 556 10*3/uL — ABNORMAL HIGH (ref 150–400)
RBC: 4.85 MIL/uL (ref 4.22–5.81)
RDW: 19.1 % — ABNORMAL HIGH (ref 11.5–15.5)
WBC: 12.6 10*3/uL — ABNORMAL HIGH (ref 4.0–10.5)
nRBC: 0 % (ref 0.0–0.2)

## 2020-12-06 LAB — BASIC METABOLIC PANEL
Anion gap: 8 (ref 5–15)
BUN: 15 mg/dL (ref 6–20)
CO2: 26 mmol/L (ref 22–32)
Calcium: 8.9 mg/dL (ref 8.9–10.3)
Chloride: 104 mmol/L (ref 98–111)
Creatinine, Ser: 0.62 mg/dL (ref 0.61–1.24)
GFR, Estimated: >60 mL/min (ref >60)
Glucose, Bld: 85 mg/dL (ref 70–99)
Potassium: 5.2 mmol/L — ABNORMAL HIGH (ref 3.5–5.1)
Sodium: 138 mmol/L (ref 135–145)

## 2020-12-06 LAB — RESP PANEL BY RT-PCR (FLU A&B, COVID) ARPGX2
Influenza A by PCR: NEGATIVE
Influenza B by PCR: NEGATIVE
SARS Coronavirus 2 by RT PCR: NEGATIVE

## 2020-12-06 LAB — LACTIC ACID, PLASMA: Lactic Acid, Venous: 1.7 mmol/L (ref 0.5–1.9)

## 2020-12-06 MED ORDER — DIVALPROEX SODIUM 125 MG PO CSDR
500.0000 mg | DELAYED_RELEASE_CAPSULE | ORAL | Status: DC
  Administered 2020-12-06 – 2020-12-15 (×18): 500 mg via ORAL
  Filled 2020-12-06 (×20): qty 4

## 2020-12-06 MED ORDER — LORATADINE 10 MG PO TABS
10.0000 mg | ORAL_TABLET | Freq: Every day | ORAL | Status: DC
  Administered 2020-12-06 – 2020-12-15 (×9): 10 mg via ORAL
  Filled 2020-12-06 (×9): qty 1

## 2020-12-06 MED ORDER — METOPROLOL TARTRATE 5 MG/5ML IV SOLN: 5.0000 mg | Freq: Four times a day (QID) | INTRAVENOUS | Status: DC | PRN

## 2020-12-06 MED ORDER — CEFAZOLIN SODIUM-DEXTROSE 1-4 GM/50ML-% IV SOLN
1.0000 g | Freq: Once | INTRAVENOUS | Status: AC
  Administered 2020-12-06: 1 g via INTRAVENOUS
  Filled 2020-12-06: qty 50

## 2020-12-06 MED ORDER — ACETAMINOPHEN 325 MG PO TABS
650.0000 mg | ORAL_TABLET | Freq: Once | ORAL | Status: AC
  Administered 2020-12-06: 650 mg via ORAL
  Filled 2020-12-06: qty 2

## 2020-12-06 MED ORDER — TRETINOIN 0.025 % EX GEL: 1.0000 "application " | CUTANEOUS | Status: DC

## 2020-12-06 MED ORDER — BISACODYL 10 MG RE SUPP: 10.0000 mg | Freq: Every day | RECTAL | Status: DC | PRN

## 2020-12-06 MED ORDER — PREVALON HEEL PROTECTOR MISC: 1.0000 | Freq: Every day | Status: DC

## 2020-12-06 MED ORDER — BACLOFEN 10 MG PO TABS
20.0000 mg | ORAL_TABLET | Freq: Three times a day (TID) | ORAL | Status: DC
  Administered 2020-12-06 – 2020-12-15 (×27): 20 mg via ORAL
  Filled 2020-12-06 (×27): qty 2

## 2020-12-06 MED ORDER — MORPHINE SULFATE (PF) 2 MG/ML IV SOLN
2.0000 mg | INTRAVENOUS | Status: DC | PRN
Start: 2020-12-06 — End: 2020-12-16
  Administered 2020-12-07 – 2020-12-15 (×16): 2 mg via INTRAVENOUS
  Filled 2020-12-06 (×16): qty 1

## 2020-12-06 MED ORDER — SENNOSIDES-DOCUSATE SODIUM 8.6-50 MG PO TABS
1.0000 | ORAL_TABLET | Freq: Every evening | ORAL | Status: DC | PRN
  Administered 2020-12-11 – 2020-12-12 (×2): 1 via ORAL
  Filled 2020-12-06 (×2): qty 1

## 2020-12-06 MED ORDER — ONDANSETRON HCL 4 MG PO TABS: 4.0000 mg | ORAL_TABLET | Freq: Four times a day (QID) | ORAL | Status: DC | PRN

## 2020-12-06 MED ORDER — MAGNESIUM CITRATE PO SOLN: 1.0000 | Freq: Once | ORAL | Status: DC | PRN

## 2020-12-06 MED ORDER — FLUTICASONE PROPIONATE 50 MCG/ACT NA SUSP
1.0000 | Freq: Every day | NASAL | Status: DC
  Administered 2020-12-07 – 2020-12-15 (×6): 1 via NASAL
  Filled 2020-12-06: qty 16

## 2020-12-06 MED ORDER — BISACODYL 5 MG PO TBEC
5.0000 mg | DELAYED_RELEASE_TABLET | Freq: Every day | ORAL | Status: DC | PRN
  Administered 2020-12-11 – 2020-12-12 (×2): 5 mg via ORAL
  Filled 2020-12-06 (×3): qty 1

## 2020-12-06 MED ORDER — ONDANSETRON HCL 4 MG/2ML IJ SOLN
4.0000 mg | Freq: Four times a day (QID) | INTRAMUSCULAR | Status: DC | PRN
  Administered 2020-12-12 – 2020-12-13 (×2): 4 mg via INTRAVENOUS
  Filled 2020-12-06 (×2): qty 2

## 2020-12-06 MED ORDER — ALBUTEROL SULFATE (2.5 MG/3ML) 0.083% IN NEBU: 3.0000 mL | INHALATION_SOLUTION | RESPIRATORY_TRACT | Status: DC | PRN

## 2020-12-06 MED ORDER — SODIUM CHLORIDE 0.9 % IV SOLN
2.0000 g | INTRAVENOUS | Status: DC
  Administered 2020-12-07 – 2020-12-10 (×5): 2 g via INTRAVENOUS
  Filled 2020-12-06: qty 20
  Filled 2020-12-06 (×4): qty 2

## 2020-12-06 MED ORDER — PREPARATION H RAPID RELIEF 5-0.25-14.4-15 % EX CREA: 1.0000 "application " | TOPICAL_CREAM | CUTANEOUS | Status: DC

## 2020-12-06 MED ORDER — ENSURE ENLIVE PO LIQD
237.0000 mL | Freq: Two times a day (BID) | ORAL | Status: DC
  Administered 2020-12-07 – 2020-12-15 (×14): 237 mL via ORAL

## 2020-12-06 MED ORDER — ACETAMINOPHEN 325 MG PO TABS
650.0000 mg | ORAL_TABLET | Freq: Four times a day (QID) | ORAL | Status: DC | PRN
  Administered 2020-12-09: 650 mg via ORAL
  Filled 2020-12-06: qty 2

## 2020-12-06 MED ORDER — SODIUM CHLORIDE (PF) 0.9 % IJ SOLN
INTRAMUSCULAR | Status: AC
  Filled 2020-12-06: qty 50

## 2020-12-06 MED ORDER — IOHEXOL 350 MG/ML SOLN
100.0000 mL | Freq: Once | INTRAVENOUS | Status: AC | PRN
  Administered 2020-12-06: 80 mL via INTRAVENOUS

## 2020-12-06 MED ORDER — ACETAMINOPHEN 650 MG RE SUPP: 650.0000 mg | Freq: Four times a day (QID) | RECTAL | Status: DC | PRN

## 2020-12-06 MED ORDER — ZINC OXIDE 40 % EX OINT
1.0000 "application " | TOPICAL_OINTMENT | Freq: Two times a day (BID) | CUTANEOUS | Status: DC | PRN
  Filled 2020-12-06: qty 57

## 2020-12-06 MED ORDER — PHENYLEPHRINE-MINERAL OIL-PET 0.25-14-74.9 % RE OINT
1.0000 "application " | TOPICAL_OINTMENT | Freq: Two times a day (BID) | RECTAL | Status: DC | PRN
  Filled 2020-12-06: qty 57

## 2020-12-06 MED ORDER — POLYSACCHARIDE IRON COMPLEX 150 MG PO CAPS
150.0000 mg | ORAL_CAPSULE | Freq: Every day | ORAL | Status: DC
  Administered 2020-12-07 – 2020-12-15 (×9): 150 mg via ORAL
  Filled 2020-12-06 (×9): qty 1

## 2020-12-06 MED ORDER — ALUM & MAG HYDROXIDE-SIMETH 200-200-20 MG/5ML PO SUSP: 15.0000 mL | Freq: Four times a day (QID) | ORAL | Status: DC | PRN

## 2020-12-06 MED ORDER — DICLOFENAC SODIUM 1 % EX GEL
4.0000 g | Freq: Four times a day (QID) | CUTANEOUS | Status: DC
  Administered 2020-12-07 – 2020-12-15 (×24): 4 g via TOPICAL
  Filled 2020-12-06: qty 100

## 2020-12-06 MED ORDER — ENOXAPARIN SODIUM 40 MG/0.4ML IJ SOSY
40.0000 mg | PREFILLED_SYRINGE | INTRAMUSCULAR | Status: DC
  Administered 2020-12-07 – 2020-12-15 (×10): 40 mg via SUBCUTANEOUS
  Filled 2020-12-06 (×10): qty 0.4

## 2020-12-06 MED ORDER — VANCOMYCIN HCL IN DEXTROSE 1-5 GM/200ML-% IV SOLN: 1000.0000 mg | Freq: Once | INTRAVENOUS | Status: DC

## 2020-12-06 MED ORDER — COLLAGENASE 250 UNIT/GM EX OINT
1.0000 "application " | TOPICAL_OINTMENT | Freq: Every day | CUTANEOUS | Status: DC
  Administered 2020-12-07 – 2020-12-15 (×8): 1 via TOPICAL
  Filled 2020-12-06: qty 30

## 2020-12-06 MED ORDER — VANCOMYCIN HCL 1250 MG/250ML IV SOLN
1250.0000 mg | Freq: Once | INTRAVENOUS | Status: AC
  Administered 2020-12-06: 1250 mg via INTRAVENOUS
  Filled 2020-12-06: qty 250

## 2020-12-06 MED ORDER — VANCOMYCIN HCL 1250 MG/250ML IV SOLN
1250.0000 mg | Freq: Two times a day (BID) | INTRAVENOUS | Status: DC
  Administered 2020-12-07 – 2020-12-08 (×2): 1250 mg via INTRAVENOUS
  Filled 2020-12-06 (×2): qty 250

## 2020-12-06 MED ORDER — HYDROCODONE-ACETAMINOPHEN 5-325 MG PO TABS
1.0000 | ORAL_TABLET | ORAL | Status: DC | PRN
  Administered 2020-12-06: 2 via ORAL
  Administered 2020-12-07 – 2020-12-10 (×2): 1 via ORAL
  Administered 2020-12-12: 2 via ORAL
  Administered 2020-12-13 – 2020-12-14 (×2): 1 via ORAL
  Filled 2020-12-06: qty 2
  Filled 2020-12-06 (×3): qty 1
  Filled 2020-12-06: qty 2
  Filled 2020-12-06: qty 1

## 2020-12-06 NOTE — ED Notes (Signed)
Transport called.

## 2020-12-06 NOTE — ED Triage Notes (Signed)
Pt BIB EMS. Pt has a sacral pressure ulcer that family noticed about 5 days ago. Per urgent care wound is infected, swollen and +drainage. Pt is at baseline.

## 2020-12-06 NOTE — ED Provider Notes (Signed)
[facility]-EMERGENCY DEPT Provider Note   CSN: [id number] Arrival date & time: 12/06/20  1138     History Chief Complaint  Patient presents with   Wound Infection    FENIX RORKE is a 60 y.o. male with a past medical history significant for cerebral palsy, PUD, bipolar disorder, history of osteomyelitis, history of DVT, and chronic pneumonia who presents to the ED due to pressure sore on left buttocks region that has been present for the past few months. Group home manager at bedside who notes wound has been present for numerous months which initially improved and then began to get worse again over the past few weeks. Patient was evaluated by PCP earlier today and was advised to report to the ED for further evaluation. Caregiver notes that patient was diaphoretic yesterday which prompted her to check his temperature which was normal.  No known fever.  Patient denies chills.  Caregiver notes that ulcer has been malodorous for the past few days with purulent discharge.  He has an appointment scheduled with wound care next week.  Caregiver notes that wound got worse after patient's most recent episode of pneumonia.  Patient admits to moderate amount of pain surrounding wound. Patient is bed bound.  Patient denies abdominal pain, nausea, vomiting, diarrhea.  History obtained from patient, caregiver, and past medical records. No interpreter used during encounter.       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   Anemia    microcytic anemia    Anxiety    takes Ativan daily as needed   Bipolar disorder (HCC)    takes Depakote daily   CEREBRAL PALSY 07/24/2006   Chronic bronchitis (HCC)    "gets it q yr"   Chronic bronchitis (HCC)    Chronic diabetic ulcer of foot determined by examination [facility])    Constipation    takes Miralax daily as needed   Depression    takes Risperdal nightly   Environmental allergies    takes Zyrtec daily    GASTRIC ULCER ACUTE WITH HEMORRHAGE 07/24/2006   Gastric ulcer with hemorrhage    hx of    GERD (gastroesophageal reflux disease)    takes Pravacid daily   History of blood transfusion    no abnormal reaction noted   History of hiatal hernia    History of shingles    History of stomach ulcers ~ 1996   Hypercholesterolemia    Intellectual functioning disability    Intermittent explosive disorder    Internal hemorrhoids    MRSA infection 09/06/2019   Obsessive compulsive disorder    Osteomyelitis (HCC)    right foot    Pneumonia "quite a few times"   in 2015 and in June 2016   Pressure ulcer of sacral region    hx of    Pseudomonas aeruginosa infection 09/06/2019   Psychotic disorder (HCC)    Recurrent UTI (urinary tract infection)    Seborrheic dermatitis    Sigmoid diverticulosis    Spastic tetraplegia (HCC)    URINARY INCONTINENCE 02/09/2010   takes Detrol daily-occasionally incontinence   Vitamin D deficiency    Weakness    numbness in extremities    Patient Active Problem List   Diagnosis Date Noted   Right pulmonary infiltrate on CXR 10/03/2020   Dysphagia 10/03/2020   GERD without esophagitis 10/03/2020   Aspiration pneumonia (HCC) 10/03/2020   Recurrent right pleural effusion 10/02/2020   Abnormal CT scan,  colon    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   Nausea and vomiting 07/31/2020   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) 10/08/2019   Osteomyelitis (HCC) 10/08/2019   Acute osteomyelitis of right ankle or foot (HCC)    Ulcerated, foot, right, with necrosis of bone (HCC)    Pseudomonas aeruginosa infection 09/06/2019   MRSA infection 09/06/2019   Lactic acidosis    Stage 3 skin ulcer of sacral region (HCC) 08/10/2018   Gastric wall thickening 08/10/2018   Alkaline phosphatase elevation 08/10/2018   Cholelithiasis 08/10/2018   Microcytic anemia    Hiatal hernia 04/07/2018   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) 01/20/2016    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/15/2015   Cerebral palsy, quadriplegic (HCC)    Pre-diabetes 08/31/2015   Foot ulcer, limited to breakdown of skin (HCC) 06/08/2015   Seasonal allergies 11/01/2014   Abdominal pain    Pressure ulcer of foot 03/14/2014   CAP (community acquired pneumonia) 03/13/2014   Decubitus ulcer of left hip, stage 3 (HCC) 01/08/2013   URINARY INCONTINENCE 02/09/2010   Mental retardation 07/24/2006   Infantile cerebral palsy (HCC) 07/24/2006    Past Surgical History:  Procedure Laterality Date   AMPUTATION Right 12/14/2014   Procedure: AMPUTATION DIGIT RIGHT GREAT TOE MTP;  Surgeon: Nadara Mustard, MD;  Location: [facility] OR;  Service: Orthopedics;  Laterality: Right;   AMPUTATION Right 10/08/2019   Procedure: RIGHT FIFTH RAY AMPUTATION, ADJACENT TISSUE TRANSFER;  Surgeon: Park Liter, DPM;  Location: [facility] ORS;  Service: Podiatry;  Laterality: Right;   AMPUTATION Right 07/27/2020   Procedure: AMPUTATION BELOW KNEE;  Surgeon: Toni Arthurs, MD;  Location: [facility] OR;  Service: Orthopedics;  Laterality: Right;    BIOPSY  08/12/2018   Procedure: BIOPSY;  Surgeon: Lynann Bologna, MD;  Location: [facility];  Service: Gastroenterology;;   COLONOSCOPY WITH PROPOFOL N/A 08/14/2018   Procedure: COLONOSCOPY WITH PROPOFOL;  Surgeon: Lynann Bologna, MD;  Location: [facility];  Service: Gastroenterology;  Laterality: N/A;   ESOPHAGOGASTRODUODENOSCOPY (EGD) WITH PROPOFOL N/A 08/12/2018   Procedure: ESOPHAGOGASTRODUODENOSCOPY (EGD) WITH PROPOFOL;  Surgeon: Lynann Bologna, MD;  Location: [facility];  Service: Gastroenterology;  Laterality: N/A;   FLEXIBLE SIGMOIDOSCOPY N/A 11/09/2020   Procedure: FLEXIBLE SIGMOIDOSCOPY;  Surgeon: [name], MD;  Location: [facility];  Service: Gastroenterology;  Laterality: N/A;   GRAFT APPLICATION Right 11/12/2019   Procedure: SKIN GRAFT SUBSTITUTE APPLICATION;  Surgeon: Park Liter, DPM;  Location: [facility];  Service: Podiatry;  Laterality:  Right;   HERNIA REPAIR     HIATAL HERNIA REPAIR  1982   LEG SURGERY Bilateral ~ 1967   "legs were scissored; stretched them out"   WOUND DEBRIDEMENT Right 11/12/2019   Procedure: DEBRIDEMENT WOUND;  Surgeon: Park Liter, DPM;  Location: [facility];  Service: Podiatry;  Laterality: Right;       Family History  Adopted: Yes  Problem Relation Age of Onset   Colon cancer Neg Hx     Social History   Tobacco Use   Smoking status: Never   Smokeless tobacco: Never  Substance Use Topics   Alcohol use: No   Drug use: No    Home Medications Prior to Admission medications   Medication Sig Start Date End Date Taking? Authorizing Provider  acetaminophen (TYLENOL) 500 MG tablet Take 500 mg by mouth every 6 (six) hours as needed (for aches, pains, and/or fevers).    [provider]  albuterol (VENTOLIN HFA) 108 (90  Base) MCG/ACT inhaler Inhale 2 puffs into the lungs every 4 (four) hours as needed for shortness of breath or wheezing. 01/11/20   [provider]  alum & mag hydroxide-simeth (MAALOX/MYLANTA) 200-200-20 MG/5ML suspension Take 15-30 mLs by mouth every 6 (six) hours as needed for indigestion or heartburn (or nausea).    [provider]  amoxicillin-clavulanate (AUGMENTIN) 875-125 MG tablet Take 1 tablet by mouth 2 (two) times daily. One po bid x 7 days 11/26/20   Zadie Rhine, MD  baclofen (LIORESAL) 20 MG tablet Take 1 tablet (20 mg total) by mouth 3 (three) times daily. 07/18/16   Kirsteins, Victorino Sparrow, MD  bisacodyl (DULCOLAX) 10 MG suppository Place 1 suppository (10 mg total) rectally daily as needed for moderate constipation. 08/03/20   Sheikh, Omair Latif, DO  brompheniramine-pseudoephedrine (DIMETAPP) 1-15 MG/5ML ELIX Take 10 mLs by mouth every 8 (eight) hours as needed (for cold symptoms).    [provider]  cetirizine (ZYRTEC) 10 MG tablet Take 1 tablet (10 mg total) by mouth daily. 11/01/14   Street, Stephanie Coup, MD  Cholecalciferol  (VITAMIN D3) 5000 units CAPS Take 5,000 Units by mouth in the morning. (0700)    [provider]  clotrimazole (LOTRIMIN) 1 % cream Apply 1 application topically 2 (two) times daily. (0700 & 1800)    [provider]  clotrimazole-betamethasone (LOTRISONE) cream Apply 1 application topically 2 (two) times daily. (0800 & 2000)    [provider]  divalproex (DEPAKOTE SPRINKLE) 125 MG capsule Take 500 mg by mouth 2 (two) times daily. (0700 & 2000)    [provider]  FERREX 150 150 MG capsule Take 150 mg by mouth daily at 12 noon.    [provider]  fluticasone (FLONASE) 50 MCG/ACT nasal spray Place 1 spray into both nostrils daily. (0700) 12/21/19   [provider]  Foot Care Products (PREVALON HEEL PROTECTOR) MISC 1 Device by Does not apply route daily. 06/20/15   Marquette Saa, MD  guaiFENesin-dextromethorphan [facility] DM) 100-10 MG/5ML syrup Take 10 mLs by mouth every 4 (four) hours as needed for cough.     [provider]  hydrocortisone cream 1 % APPLY TOPICALLY AT BEDTIME AS NEEDED FOR ITCHING 05/29/20   Mullis, Kiersten P, DO  ketoconazole (NIZORAL) 2 % shampoo APPLY TOPICALLY TO AFFECTED AREA(S) TWICE WEEKLY AS DIRECTED 12/22/19   Mullis, Kiersten P, DO  Lido-PE-Glycerin-Petrolatum (PREPARATION H RAPID RELIEF) 5-0.25-14.4-15 % CREA Apply 1 application topically See admin instructions. Apply a thin layer topically to the affected area 2 times a day for residual hemorrhoidal skin tag    [provider]  LORazepam (ATIVAN) 2 MG tablet Take 1 mg by mouth every 4 (four) hours as needed for anxiety.    [provider]  magnesium citrate SOLN Use as directed 11/06/20   [name], MD  Neomycin-Bacitracin-Polymyxin (TRIPLE ANTIBIOTIC EX) Apply 1 application topically daily as needed (cuts/scrapes/scratches).    [provider]  omeprazole (PRILOSEC) 40 MG capsule Take 1 capsule (40 mg total) by  mouth 2 (two) times daily. 08/15/18   Rodolph Bong, MD  ondansetron (ZOFRAN) 4 MG tablet Take 1 tablet (4 mg total) by mouth every 6 (six) hours as needed for nausea. 08/03/20   Sheikh, Omair Latif, DO  Oyster Shell (OYSTER CALCIUM) 500 MG TABS tablet Take 1,500 mg of elemental calcium by mouth daily.    [provider]  phenylephrine-shark liver oil-mineral oil-petrolatum (PREPARATION H) 0.25-14-74.9 % rectal ointment Place 1  application rectally 2 (two) times daily.    [provider]  risperiDONE (RISPERDAL) 2 MG tablet Take 2 mg by mouth at bedtime. (2100)    [provider]  senna-docusate (SENOKOT-S) 8.6-50 MG tablet Take 1 tablet by mouth at bedtime. 10/06/20   Ghimire, Werner Lean, MD  sodium phosphate (FLEET) 7-19 GM/118ML ENEM Use as directed 11/06/20   [name], MD  tamsulosin (FLOMAX) 0.4 MG CAPS capsule TAKE 1 CAPSULE BY MOUTH EVERY DAY *DO NOT CRUSH* 06/19/20   Mullis, Kiersten P, DO  tolterodine (DETROL LA) 2 MG 24 hr capsule Take 1 capsule (2 mg total) by mouth daily. Patient not taking: Reported on 11/29/2020 04/08/13   Street, Stephanie Coup, MD  tretinoin (RETIN-A) 0.025 % gel Apply 1 application topically See admin instructions. Apply to the affected areas of the face at bedtime    [provider]  vitamin C (VITAMIN C) 250 MG tablet Take 1 tablet (250 mg total) by mouth daily. 01/18/16   Palma Holter, MD  VOLTAREN 1 % GEL APPLY 4 GRAMS TOPICALLY TO AFFECTED AREA(S) FOUR TIMES DAILY 06/16/19   Mullis, Kiersten P, DO  Zinc Oxide 40 % PSTE Apply 1 application topically 2 (two) times daily as needed (unspecified). Desitin Max strength paste apply thin layer topically to buttocks and scrotum 06/07/20   McDiarmid, Leighton Roach, MD    Allergies    Adhesive [tape]  Review of Systems   Review of Systems  Constitutional:  Negative for chills and fever.  Respiratory:  Negative for shortness of breath.   Cardiovascular:  Negative for chest pain.   Gastrointestinal:  Negative for abdominal pain, diarrhea, nausea and vomiting.  Skin:  Positive for color change and wound.   Physical Exam Updated Vital Signs BP 102/65   Pulse 81   Temp 98.3 F (36.8 C) (Rectal)   Resp 19   Ht 6' (1.829 m)   Wt 68 kg   SpO2 97%   BMI 20.33 kg/m   Physical Exam Vitals and nursing note reviewed.  Constitutional:      General: He is not in acute distress.    Appearance: He is not ill-appearing.  HENT:     Head: Normocephalic.  Eyes:     Pupils: Pupils are equal, round, and reactive to light.  Cardiovascular:     Rate and Rhythm: Normal rate and regular rhythm.     Pulses: Normal pulses.     Heart sounds: Normal heart sounds. No murmur heard.   No friction rub. No gallop.  Pulmonary:     Effort: Pulmonary effort is normal.     Breath sounds: Normal breath sounds.  Abdominal:     General: Abdomen is flat. There is no distension.     Palpations: Abdomen is soft.     Tenderness: There is no abdominal tenderness. There is no guarding or rebound.  Musculoskeletal:        General: Normal range of motion.     Cervical back: Neck supple.  Skin:    General: Skin is warm and dry.     Comments: Sacral wound with surrounding erythema and malodor. Mild purulent drainage on bandage and surrounding wound.   Neurological:     General: No focal deficit present.     Mental Status: He is alert.  Psychiatric:        Mood and Affect: Mood normal.        Behavior: Behavior normal.     ED Results / Procedures /  Treatments   Labs (all labs ordered are listed, but only abnormal results are displayed) Labs Reviewed  CBC WITH DIFFERENTIAL/PLATELET - Abnormal; Notable for the following components:      Result Value   WBC 12.6 (*)    Hemoglobin 11.6 (*)    HCT 37.5 (*)    MCV 77.3 (*)    MCH 23.9 (*)    RDW 19.1 (*)    Platelets 556 (*)    Neutro Abs 8.9 (*)    Monocytes Absolute 1.3 (*)    Abs Immature Granulocytes 0.13 (*)    All other  components within normal limits  BASIC METABOLIC PANEL - Abnormal; Notable for the following components:   Potassium 5.2 (*)    All other components within normal limits  RESP PANEL BY RT-PCR (FLU A&B, COVID) [id number]  LACTIC ACID, PLASMA    EKG None  Radiology CT ABDOMEN PELVIS W CONTRAST  Result Date: 12/06/2020 CLINICAL DATA:  Sacral pressure ulcer.  Question wound dehiscence. EXAM: CT ABDOMEN AND PELVIS WITH CONTRAST TECHNIQUE: Multidetector CT imaging of the abdomen and pelvis was performed using the standard protocol following bolus administration of intravenous contrast. CONTRAST:  80mL OMNIPAQUE IOHEXOL 350 MG/ML SOLN COMPARISON:  CT abdomen pelvis 09/05/2020 FINDINGS: Lower chest: Patient scanned in the right lateral decubitus position. Left lung is clear. L hepatic changes noted in the right lung. Right pleural effusion noted. Heart size is normal. Hepatobiliary: Dependent stones present in the gallbladder fundus. No inflammatory change. Is some edema about the bile ducts. Obstruction present. Pancreas: Unremarkable. No pancreatic ductal dilatation or surrounding inflammatory changes. Spleen: Normal in size without focal abnormality. Adrenals/Urinary Tract: Adrenal glands are unremarkable. Kidneys are normal, without renal calculi, focal lesion, or hydronephrosis. Bladder is unremarkable. Stomach/Bowel: Large hiatal hernia again noted. Distal stomach and duodenum within normal limits. Small bowel unremarkable. Proximal colon within normal limits. Large amount of stool present within the rectum. Transverse diameter up to 8 cm. Extensive soft tissue swelling noted in the left gluteal fold extending to the muscles. No drainable fluid collection is present. No sinus tract evident. No intra or extraperitoneal fluid or gas evident. Vascular/Lymphatic: Atherosclerotic calcifications present in aorta branch vessels without aneurysm. Reproductive: Prostate is unremarkable. Other: No abdominal wall  hernia or abnormality. No abdominopelvic ascites. Musculoskeletal: Lytic blastic lesions are present. Facet degenerative changes are noted in the lower lumbar spine. Bony pelvis within normal limits. IMPRESSION: 1. Extensive soft tissue swelling in the left gluteal fold extending to the muscles compatible with cellulitis. No drainable fluid collection is present. 2. No sinus tract evident. 3. Large amount of stool within the rectum. 4. Right pleural effusion. 5. Cholelithiasis without evidence for cholecystitis. 6. Large hiatal hernia. 7. Aortic Atherosclerosis (ICD10-I70.0). Electronically Signed   By: Marin Roberts M.D.   On: 12/06/2020 15:17    Procedures Procedures   Medications Ordered in ED Medications  sodium chloride (PF) 0.9 % injection (has no administration in time range)  ceFAZolin (ANCEF) IVPB 1 g/50 mL premix (has no administration in time range)  acetaminophen (TYLENOL) tablet 650 mg (650 mg Oral Given 12/06/20 1354)  iohexol (OMNIPAQUE) 350 MG/ML injection 100 mL (80 mLs Intravenous Contrast Given 12/06/20 1426)    ED Course  I have reviewed the triage vital signs and the nursing notes.  Pertinent labs & imaging results that were available during my care of the patient were reviewed by me and considered in my medical decision making (see chart for details).    MDM  Rules/Calculators/A&P                         60 year old male presents to the ED due to sacral wound that has been present for the past few months however, worsened over the past few days with purulent drainage and malodor.  No known fever.  Patient is bedbound.  Upon arrival, vitals all within normal limits.  Patient is afebrile, not tachycardic or hypoxic.  Patient in no acute distress.  Sacral wound with surrounding erythema and purulent drainage.  Abdomen soft, nondistended, nontender.  Labs ordered.  CT abdomen pelvis to evaluate sacral wound.  Discussed case with Dr. Deretha EmoryZackowski who agrees with assessment and  plan.  CBC significant for leukocytosis at 12.6.  Anemia with hemoglobin 11.6.  Thrombocytosis at 556.  BMP significant for hyperkalemia 5.2.  Normal renal function. Normal lactic acid.  CT abdomen personally reviewed which demonstrates:   IMPRESSION:  1. Extensive soft tissue swelling in the left gluteal fold extending  to the muscles compatible with cellulitis. No drainable fluid  collection is present.  2. No sinus tract evident.  3. Large amount of stool within the rectum.  4. Right pleural effusion.  5. Cholelithiasis without evidence for cholecystitis.  6. Large hiatal hernia.  7. Aortic Atherosclerosis (ICD10-I70.0).   Due to extensive nature, feel patient would benefit for admission for IV antibiotics. IV antibiotics started.   Discussed case with Dr. Ardyth GalGaring with [facility] who agrees to admit patient. Final Clinical Impression(s) / ED Diagnoses Final diagnoses:  Wound infection    Rx / DC Orders ED Discharge Orders     None        Jesusita Okaberman, Kayla Deshaies C, PA-C 12/06/20 1537    Vanetta MuldersZackowski, Scott, MD 12/07/20 [phone]

## 2020-12-06 NOTE — H&P (Signed)
History and Physical    [name] Rhineerry L Mcdonnell JYN:[id number] DOB: [birth year] DOA: 12/06/2020  PCP: Harvest ForestBakare, Mobolaji B, MD  Patient coming from: Group home  I have personally briefly reviewed [facility]  Chief Complaint: sacral wound  HPI: [name] is a 60 y.o. 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 He has had the sacral wound for a long time but it began to hurt more severely on 12/05/20 and continued to worsen. Pain is located the sacral area over the wound and radiates to the back. It is up to 8/10 at times and is characterized as sharp. Symptoms are alleviated by nothing and exacerbated by nothing. Associated symptoms: Chills but no known fever. Fatigue. No chest pain or palpitations.    ED Course: Patient was started on antibiotic.  Review of Systems: As per HPI otherwise all other systems reviewed and are unremarable.  RESPIRATORY: No cough, wheezing, or shortness of breath.  GI: No abdominal pain, nausea, vomiting, diarrhea, constipation, or bloody stool.    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   Anemia    microcytic anemia    Anxiety    takes Ativan daily as needed   Bipolar disorder (HCC)    takes Depakote daily   CEREBRAL PALSY 07/24/2006   Chronic bronchitis (HCC)    "gets it q yr"   Chronic bronchitis (HCC)    Chronic diabetic ulcer of foot determined by examination [facility](HCC)    Constipation    takes Miralax daily as needed   Depression    takes Risperdal nightly   Environmental allergies    takes Zyrtec daily   GASTRIC ULCER ACUTE WITH HEMORRHAGE 07/24/2006   Gastric ulcer with hemorrhage    hx of    GERD (gastroesophageal reflux disease)    takes Pravacid daily   History of blood transfusion    no abnormal reaction noted   History of  hiatal hernia    History of shingles    History of stomach ulcers ~ 1996   Hypercholesterolemia    Intellectual functioning disability    Intermittent explosive disorder    Internal hemorrhoids    MRSA infection 09/06/2019   Obsessive compulsive disorder    Osteomyelitis (HCC)    right foot    Pneumonia "quite a few times"   in 2015 and in June 2016   Pressure ulcer of sacral region    hx of    Pseudomonas aeruginosa infection 09/06/2019   Psychotic disorder (HCC)    Recurrent UTI (urinary tract infection)    Seborrheic dermatitis    Sigmoid diverticulosis    Spastic tetraplegia (HCC)    URINARY INCONTINENCE 02/09/2010   takes Detrol daily-occasionally incontinence   Vitamin D deficiency    Weakness    numbness in extremities    Past Surgical History:  Procedure Laterality Date   AMPUTATION Right 12/14/2014   Procedure: AMPUTATION DIGIT RIGHT GREAT TOE MTP;  Surgeon: Nadara MustardMarcus Duda V, MD;  Location: [facility] OR;  Service: Orthopedics;  Laterality: Right;   AMPUTATION Right 10/08/2019   Procedure: RIGHT FIFTH RAY AMPUTATION, ADJACENT TISSUE TRANSFER;  Surgeon: Park LiterPrice, Michael J, DPM;  Location: [facility] ORS;  Service: Podiatry;  Laterality: Right;   AMPUTATION Right 07/27/2020   Procedure: AMPUTATION BELOW KNEE;  Surgeon: Toni ArthursHewitt, John, MD;  Location: [facility] OR;  Service: Orthopedics;  Laterality: Right;    BIOPSY  08/12/2018   Procedure: BIOPSY;  Surgeon: Lynann Bologna, MD;  Location: [facility];  Service: Gastroenterology;;   COLONOSCOPY WITH PROPOFOL N/A 08/14/2018   Procedure: COLONOSCOPY WITH PROPOFOL;  Surgeon: Lynann Bologna, MD;  Location: [facility];  Service: Gastroenterology;  Laterality: N/A;   ESOPHAGOGASTRODUODENOSCOPY (EGD) WITH PROPOFOL N/A 08/12/2018   Procedure: ESOPHAGOGASTRODUODENOSCOPY (EGD) WITH PROPOFOL;  Surgeon: Lynann Bologna, MD;  Location: [facility];  Service: Gastroenterology;  Laterality: N/A;   FLEXIBLE SIGMOIDOSCOPY N/A 11/09/2020   Procedure: FLEXIBLE  SIGMOIDOSCOPY;  Surgeon: [name], MD;  Location: [facility];  Service: Gastroenterology;  Laterality: N/A;   GRAFT APPLICATION Right 11/12/2019   Procedure: SKIN GRAFT SUBSTITUTE APPLICATION;  Surgeon: Park Liter, DPM;  Location: [facility];  Service: Podiatry;  Laterality: Right;   HERNIA REPAIR     HIATAL HERNIA REPAIR  1982   LEG SURGERY Bilateral ~ 1967   "legs were scissored; stretched them out"   WOUND DEBRIDEMENT Right 11/12/2019   Procedure: DEBRIDEMENT WOUND;  Surgeon: Park Liter, DPM;  Location: [facility];  Service: Podiatry;  Laterality: Right;    Social History  reports that he has never smoked. He has never used smokeless tobacco. He reports that he does not drink alcohol and does not use drugs.  Allergies  Allergen Reactions   Adhesive [Tape] Rash    Family History  Adopted: Yes  Problem Relation Age of Onset   Colon cancer Neg Hx      Home Medications  Prior to Admission medications   Medication Sig Start Date End Date Taking? Authorizing Provider  acetaminophen (TYLENOL) 500 MG tablet Take 500 mg by mouth every 6 (six) hours as needed (for aches, pains, and/or fevers).   Yes [provider]  albuterol (VENTOLIN HFA) 108 (90 Base) MCG/ACT inhaler Inhale 2 puffs into the lungs every 4 (four) hours as needed for shortness of breath or wheezing. 01/11/20  Yes [provider]  alum & mag hydroxide-simeth (MAALOX/MYLANTA) 200-200-20 MG/5ML suspension Take 15-30 mLs by mouth every 6 (six) hours as needed for indigestion or heartburn (or nausea).   Yes [provider]  baclofen (LIORESAL) 20 MG tablet Take 1 tablet (20 mg total) by mouth 3 (three) times daily. 07/18/16  Yes Kirsteins, Victorino Sparrow, MD  bisacodyl (DULCOLAX) 10 MG suppository Place 1 suppository (10 mg total) rectally daily as needed for moderate constipation. Patient taking differently: Place 10 mg rectally See admin instructions. Insert 1 suppository rectally every other  day and and once a day as needed for moderate constipation 08/03/20  Yes Sheikh, Omair Latif, DO  brompheniramine-pseudoephedrine (DIMETAPP) 1-15 MG/5ML ELIX Take 10 mLs by mouth every 8 (eight) hours as needed (for cold symptoms).   Yes [provider]  cetirizine (ZYRTEC) 10 MG tablet Take 1 tablet (10 mg total) by mouth daily. Patient taking differently: Take 10 mg by mouth at bedtime. 11/01/14  Yes Street, Stephanie Coup, MD  divalproex (DEPAKOTE SPRINKLE) 125 MG capsule Take 500 mg by mouth 2 (two) times daily. (0700 & 2000)   Yes [provider]  Ensure (ENSURE) Take 237 mLs by mouth 2 (two) times daily between meals.   Yes [provider]  FERREX 150 150 MG capsule Take 150 mg by mouth daily at 12 noon.   Yes [provider]  fluticasone (FLONASE) 50 MCG/ACT nasal spray Place 1 spray into both nostrils daily. (0700) 12/21/19  Yes [provider]  guaiFENesin-dextromethorphan (ROBITUSSIN DM) 100-10 MG/5ML syrup Take 10 mLs by mouth every 4 (four) hours as needed for cough.    Yes [provider]  hydrocortisone cream 1 % APPLY TOPICALLY AT BEDTIME AS NEEDED FOR ITCHING Patient taking differently: Apply 1 application topically See admin instructions. Apply a thin later to affected areas daily as directed 05/29/20  Yes Mullis, Kiersten P, DO  Lido-PE-Glycerin-Petrolatum (PREPARATION H RAPID RELIEF) 5-0.25-14.4-15 % CREA Apply 1 application topically See admin instructions. Apply a thin layer topically to the affected area 2 times a day for hemorrhoids   Yes [provider]  Neomycin-Bacitracin-Polymyxin (TRIPLE ANTIBIOTIC EX) Apply 1 application topically daily as needed (cuts/scrapes/scratches).   Yes [provider]  ondansetron (ZOFRAN) 4 MG tablet Take 1 tablet (4 mg total) by mouth every 6 (six) hours as needed for nausea. 08/03/20  Yes Sheikh, Omair Latif, DO  polyethylene glycol powder (GLYCOLAX/MIRALAX) 17 GM/SCOOP powder Take  17 g by mouth See admin instructions. Mix 17 grams as directed every morning and once daily as needed for constipation   Yes [provider]  SANTYL ointment Apply 1 application topically See admin instructions. Apply to wound in the sacral area once a day   Yes [provider]  tretinoin (RETIN-A) 0.025 % gel Apply 1 application topically See admin instructions. Apply to the affected areas of the face at bedtime   Yes [provider]  VOLTAREN 1 % GEL APPLY 4 GRAMS TOPICALLY TO AFFECTED AREA(S) FOUR TIMES DAILY Patient taking differently: Apply 4 g topically See admin instructions. Apply 4 grams to affected areas four times a day 06/16/19  Yes Mullis, Kiersten P, DO  Zinc Oxide 40 % PSTE Apply 1 application topically 2 (two) times daily as needed (unspecified). Desitin Max strength paste apply thin layer topically to buttocks and scrotum Patient taking differently: Apply 1 application topically 2 (two) times daily as needed (unspecified). Desitin Max strength paste- Apply a thin layer topically to buttocks and scrotum 2 times a day as needed for irritation 06/07/20  Yes McDiarmid, Leighton Roach, MD  Cholecalciferol (VITAMIN D3) 5000 units CAPS Take 5,000 Units by mouth in the morning. (0700) Patient not taking: Reported on 12/06/2020    [provider]  clotrimazole (LOTRIMIN) 1 % cream Apply 1 application topically 2 (two) times daily. (0700 & 1800) Patient not taking: Reported on 12/06/2020    [provider]  clotrimazole-betamethasone (LOTRISONE) cream Apply 1 application topically 2 (two) times daily. (0800 & 2000) Patient not taking: Reported on 12/06/2020    [provider]  Foot Care Products (PREVALON HEEL PROTECTOR) MISC 1 Device by Does not apply route daily. 06/20/15   Marquette Saa, MD  ketoconazole (NIZORAL) 2 % shampoo APPLY TOPICALLY TO AFFECTED AREA(S) TWICE WEEKLY AS DIRECTED Patient not taking: Reported on 12/06/2020 12/22/19    Orpah Cobb P, DO  LORazepam (ATIVAN) 2 MG tablet Take 1 mg by mouth every 4 (four) hours as needed for anxiety. Patient not taking: Reported on 12/06/2020    [provider]  magnesium citrate SOLN Use as directed Patient not taking: Reported on 12/06/2020 11/06/20   [name], MD  omeprazole (PRILOSEC) 40 MG capsule Take 1 capsule (40 mg total) by mouth 2 (two) times daily. Patient not taking: Reported on 12/06/2020 08/15/18   Rodolph Bong, MD  Ethelda Chick (OYSTER CALCIUM) 500 MG TABS tablet Take 1,500 mg of elemental calcium by mouth daily. Patient not taking: Reported on 12/06/2020  [provider]  risperiDONE (RISPERDAL) 2 MG tablet Take 2 mg by mouth at bedtime. (2100) Patient not taking: Reported on 12/06/2020    [provider]  senna-docusate (SENOKOT-S) 8.6-50 MG tablet Take 1 tablet by mouth at bedtime. Patient not taking: Reported on 12/06/2020 10/06/20   Maretta Bees, MD  sodium phosphate (FLEET) 7-19 GM/118ML ENEM Use as directed Patient not taking: Reported on 12/06/2020 11/06/20   [name], MD  tamsulosin (FLOMAX) 0.4 MG CAPS capsule TAKE 1 CAPSULE BY MOUTH EVERY DAY *DO NOT CRUSH* Patient not taking: Reported on 12/06/2020 06/19/20   Orpah Cobb P, DO  tolterodine (DETROL LA) 2 MG 24 hr capsule Take 1 capsule (2 mg total) by mouth daily. Patient not taking: Reported on 12/06/2020 04/08/13   Street, Stephanie Coup, MD  vitamin C (VITAMIN C) 250 MG tablet Take 1 tablet (250 mg total) by mouth daily. Patient not taking: Reported on 12/06/2020 01/18/16   Palma Holter, MD    Physical Exam: Vitals:   12/06/20 1230 12/06/20 1330 12/06/20 1344 12/06/20 1500  BP: 116/61 111/69  102/65  Pulse: 81 86  81  Resp: Temp:   98.3 F (36.8 C)   TempSrc:   Rectal   SpO2: 99% 99%  97%  Weight:      Height:        Constitutional: NAD, calm, comfortable, ill-appearing. Vitals:   12/06/20 1230 12/06/20 1330  12/06/20 1344 12/06/20 1500  BP: 116/61 111/69  102/65  Pulse: 81 86  81  Resp: Temp:   98.3 F (36.8 C)   TempSrc:   Rectal   SpO2: 99% 99%  97%  Weight:      Height:       Eyes: Pupils equal and round, lids and conjunctivae without icterus or erythema. ENMT: Mucous membranes are dry. Posterior pharynx clear of any exudate or lesions. Nares patent without discharge or bleeding.  Normocephalic, atraumatic.  Poor dentition.  Neck: normal, supple, no masses, trachea midline.  Thyroid nontender, no masses appreciated, no thyromegaly. Respiratory: clear to auscultation bilaterally. Chest wall movements are symmetric. No wheezing, no crackles.  No rhonchi.  Normal respiratory effort. No accessory muscle use.  Cardiovascular: Regular rate and rhythm, no murmurs / rubs / gallops. Pulses: DP pulses 2+ bilaterally. No carotid bruits.  Capillary refill less than 3 seconds. Edema: None bilaterally except Left foot edema. GI: soft, non-distended, normal active bowel sounds. No hepatosplenomegaly. No rigidity, rebound, or guarding. Non-tender. No masses palpated.  Musculoskeletal: no clubbing / cyanosis. Severe contractures of right upper extremity with no movement. Right leg amputation noted. Tenderness over lower back bilaterally without deformity. No joint deformity upper and lower extremities.  Mild tenderness to palpation of left foot. Integument: Clean, dry. Pale. Large sacral ulcer/wound with foul-smelling exudate; area is tender. Left foot with old scar/healing wound on dorsum of foot and heel. Neurologic: CN 2-12 grossly intact. Sensation grossly intact to light touch. DTR 2+ bilaterally upper extremities.  Babinski: Toes non-reactive on left foot.  Strength 4/5 in left upper extremity, 0/5 in right upper extremity. Patient reports he cannot move either lower extremity, so strength may be 0/5. Speech is slightly dysarthric.  No pronator drift on left. Psychiatric: Poor judgment and  insight. Alert and oriented x 2. Normal mood.  Normal and appropriate affect. Lymphatic: No cervical lymphadenopathy. No supraclavicular lymphadenopathy.   Labs on Admission: I have personally reviewed the following labs and  imaging studies.  CBC: Recent Labs  Lab 12/06/20 1251  WBC 12.6*  NEUTROABS 8.9*  HGB 11.6*  HCT 37.5*  MCV 77.3*  PLT 556*    Basic Metabolic Panel: Recent Labs  Lab 12/06/20 1251  NA 138  K 5.2*  CL 104  CO2 26  GLUCOSE 85  BUN 15  CREATININE 0.62  CALCIUM 8.9    GFR: Estimated Creatinine Clearance: 94.4 mL/min (by C-G formula based on SCr of 0.62 mg/dL).  Liver Function Tests: No results for input(s): AST, ALT, ALKPHOS, BILITOT, PROT, ALBUMIN in the last 168 hours.  Urine analysis:    Component Value Date/Time   COLORURINE YELLOW 11/25/2020 2145   APPEARANCEUR CLEAR 11/25/2020 2145   LABSPEC 1.023 11/25/2020 2145   PHURINE 6.0 11/25/2020 2145   GLUCOSEU >=500 (A) 11/25/2020 2145   HGBUR NEGATIVE 11/25/2020 2145   HGBUR negative 06/14/2010 1119   BILIRUBINUR NEGATIVE 11/25/2020 2145   BILIRUBINUR SMALL 01/04/2016 1634   KETONESUR 5 (A) 11/25/2020 2145   PROTEINUR NEGATIVE 11/25/2020 2145   UROBILINOGEN 2.0 (H) 02/05/2018 1825   NITRITE NEGATIVE 11/25/2020 2145   LEUKOCYTESUR NEGATIVE 11/25/2020 2145    Radiological Exams on Admission: CT ABDOMEN PELVIS W CONTRAST  Result Date: 12/06/2020 CLINICAL DATA:  Sacral pressure ulcer.  Question wound dehiscence. EXAM: CT ABDOMEN AND PELVIS WITH CONTRAST TECHNIQUE: Multidetector CT imaging of the abdomen and pelvis was performed using the standard protocol following bolus administration of intravenous contrast. CONTRAST:  55mL OMNIPAQUE IOHEXOL 350 MG/ML SOLN COMPARISON:  CT abdomen pelvis 09/05/2020 FINDINGS: Lower chest: Patient scanned in the right lateral decubitus position. Left lung is clear. L hepatic changes noted in the right lung. Right pleural effusion noted. Heart size is normal.  Hepatobiliary: Dependent stones present in the gallbladder fundus. No inflammatory change. Is some edema about the bile ducts. Obstruction present. Pancreas: Unremarkable. No pancreatic ductal dilatation or surrounding inflammatory changes. Spleen: Normal in size without focal abnormality. Adrenals/Urinary Tract: Adrenal glands are unremarkable. Kidneys are normal, without renal calculi, focal lesion, or hydronephrosis. Bladder is unremarkable. Stomach/Bowel: Large hiatal hernia again noted. Distal stomach and duodenum within normal limits. Small bowel unremarkable. Proximal colon within normal limits. Large amount of stool present within the rectum. Transverse diameter up to 8 cm. Extensive soft tissue swelling noted in the left gluteal fold extending to the muscles. No drainable fluid collection is present. No sinus tract evident. No intra or extraperitoneal fluid or gas evident. Vascular/Lymphatic: Atherosclerotic calcifications present in aorta branch vessels without aneurysm. Reproductive: Prostate is unremarkable. Other: No abdominal wall hernia or abnormality. No abdominopelvic ascites. Musculoskeletal: Lytic blastic lesions are present. Facet degenerative changes are noted in the lower lumbar spine. Bony pelvis within normal limits. IMPRESSION: 1. Extensive soft tissue swelling in the left gluteal fold extending to the muscles compatible with cellulitis. No drainable fluid collection is present. 2. No sinus tract evident. 3. Large amount of stool within the rectum. 4. Right pleural effusion. 5. Cholelithiasis without evidence for cholecystitis. 6. Large hiatal hernia. 7. Aortic Atherosclerosis (ICD10-I70.0). Electronically Signed   By: Marin Roberts M.D.   On: 12/06/2020 15:17    EKG: Independently reviewed. 78 bpm. Normal sinus rhythm.  Assessment/Plan Principal Problem:   Cellulitis of sacral region With known sacral /buttock pressure ulcer and infection of the ulcer. Plan: IV antibiotics.  IV morphine PRN.  Active Problems:    Cerebral palsy, quadriplegic (HCC) Plan: Patient will require additional nurse and nurse assistant care due to his disability.  Hx of right BKA (HCC) Noted. Plan: Will require additional care for ADLs.    Recurrent right pleural effusion  Not hypoxic upon exam. Plan: Monitor breathing.     Intellectual disability May take more time to work with patient to explain tasks.    DVT prophylaxis: Lovenox.  Code Status:   Full Code    Disposition Plan:   Patient is from:  Home  Anticipated DC to:  Home  Anticipated DC date:  12/10/20  Anticipated DC barriers: pain  Consults called:  Wound care consult: placed order in Epic  Admission status:  Inpatient    Severity of Illness: The appropriate patient status for this patient is INPATIENT. Inpatient status is judged to be reasonable and necessary in order to provide the required intensity of service to ensure the patient's safety. The patient's presenting symptoms, physical exam findings, and initial radiographic and laboratory data in the context of their chronic comorbidities is felt to place them at high risk for further clinical deterioration. Furthermore, it is not anticipated that the patient will be medically stable for discharge from the hospital within 2 midnights of admission. The following factors support the patient status of inpatient.   " The patient's presenting symptoms include pain, large open wound. " The worrisome physical exam findings include large open wound. " The initial radiographic and laboratory data are worrisome because of extensive area of infection. " The chronic co-morbidities include cerebral palsy, severe weakness quadriplegia except he can move his left arm somewhat.   * I certify that at the point of admission it is my clinical judgment that the patient will require inpatient hospital care spanning beyond 2 midnights from the point of admission due to high  intensity of service, high risk for further deterioration and high frequency of surveillance required.Marlow Baars MD [facility]  How to contact the [facility] Attending or Consulting provider 7A - 7P or covering provider during after hours 7P -7A, for this patient?   Check the care team in [facility] and look for a) attending/consulting [facility] provider listed and b) the [facility] team listed Log into [email or web address] and use [facility] universal password to access. If you do not have the password, please contact the hospital operator. Locate the [facility] provider you are looking for under [facility] and page to a number that you can be directly reached. If you still have difficulty reaching the provider, please page the [facility] (Director on Call) for the Hospitalists listed on amion for assistance.  12/06/2020, 9:38 PM

## 2020-12-06 NOTE — Progress Notes (Signed)
Pharmacy Antibiotic Note  ABDISHAKUR [name] is a 60 y.o. male admitted on 12/06/2020 with a past medical history significant for cerebral palsy, PUD, bipolar disorder, history of osteomyelitis, history of DVT, and chronic pneumonia who presents to the ED due to pressure sore on left buttocks region that has been present for the past few months  Pharmacy has been consulted to dose vancomycin for cellulitis.  Plan: Vancomycin 1250mg  IV x 1 then 1250mg  q12h (AUC 483.8, Scr 0.62) CTX per MD Follow renal function and clinical course   Height: 6' (182.9 cm) Weight: 68 kg (149 lb 14.6 oz) IBW/kg (Calculated) : 77.6  Temp (24hrs), Avg:98.3 F (36.8 C), Min:98.1 F (36.7 C), Max:98.6 F (37 C)  Recent Labs  Lab 12/06/20 1251  WBC 12.6*  CREATININE 0.62  LATICACIDVEN 1.7    Estimated Creatinine Clearance: 94.4 mL/min (by C-G formula based on SCr of 0.62 mg/dL).    Allergies  Allergen Reactions   Adhesive [Tape] Rash    Antimicrobials this admission: 7/13 ancef x 1 7/13 vanc >> 7/14 CTX >>  Dose adjustments this admission:   Microbiology results: 7/13 BCx:    Thank you for allowing pharmacy to be a part of this patient's care.  8/14 RPh 12/06/2020, 10:56 PM

## 2020-12-06 NOTE — ED Notes (Signed)
Attempted to obtain second set of blood cultures x2

## 2020-12-07 LAB — CBC
HCT: 34.9 % — ABNORMAL LOW (ref 39.0–52.0)
Hemoglobin: 11 g/dL — ABNORMAL LOW (ref 13.0–17.0)
MCH: 24 pg — ABNORMAL LOW (ref 26.0–34.0)
MCHC: 31.5 g/dL (ref 30.0–36.0)
MCV: 76 fL — ABNORMAL LOW (ref 80.0–100.0)
Platelets: 543 10*3/uL — ABNORMAL HIGH (ref 150–400)
RBC: 4.59 MIL/uL (ref 4.22–5.81)
RDW: 18.6 % — ABNORMAL HIGH (ref 11.5–15.5)
WBC: 11.2 10*3/uL — ABNORMAL HIGH (ref 4.0–10.5)
nRBC: 0 % (ref 0.0–0.2)

## 2020-12-07 LAB — BASIC METABOLIC PANEL
Anion gap: 9 (ref 5–15)
BUN: 17 mg/dL (ref 6–20)
CO2: 24 mmol/L (ref 22–32)
Calcium: 8.8 mg/dL — ABNORMAL LOW (ref 8.9–10.3)
Chloride: 103 mmol/L (ref 98–111)
Creatinine, Ser: 0.53 mg/dL — ABNORMAL LOW (ref 0.61–1.24)
GFR, Estimated: >60 mL/min (ref >60)
Glucose, Bld: 85 mg/dL (ref 70–99)
Potassium: 4.2 mmol/L (ref 3.5–5.1)
Sodium: 136 mmol/L (ref 135–145)

## 2020-12-07 LAB — MRSA NEXT GEN BY PCR, NASAL: MRSA by PCR Next Gen: DETECTED — AB

## 2020-12-07 MED ORDER — HYDROCERIN EX CREA
TOPICAL_CREAM | Freq: Two times a day (BID) | CUTANEOUS | Status: AC
  Administered 2020-12-09 – 2020-12-13 (×4): 1 via TOPICAL
  Filled 2020-12-07 (×2): qty 113

## 2020-12-07 NOTE — Progress Notes (Signed)
PT Cancellation Note  Patient Details Name: [name] MRN: [id number] DOB: 01-Nov-1960   Cancelled Treatment:     Chart reviewed, see patient is going for surgical debridement tomorrow. Will cancel PT hydro order at this time. Please reorder after surgery if [facility] therapy is appropriate.     Thanks Cheron Schaumann 12/07/2020, 3:02 PM Clois Dupes, PT, MPT Acute Rehabilitation Services Office: [phone] Pager: [phone] 12/07/2020

## 2020-12-07 NOTE — Anesthesia Preprocedure Evaluation (Addendum)
Anesthesia Evaluation  Patient identified by MRN, date of birth, ID band Patient awake    Reviewed: Allergy & Precautions, NPO status , Patient's Chart, lab work & pertinent test results  Airway Mallampati: III  TM Distance: >3 FB Neck ROM: Full    Dental  (+) Dental Advisory Given, Poor Dentition   Pulmonary COPD (Hx of CHronic Bronchitis),    Pulmonary exam normal        Cardiovascular Exercise Tolerance: Good Normal cardiovascular exam     Neuro/Psych PSYCHIATRIC DISORDERS Anxiety Depression Bipolar Disorder  OCD  Cerebral palsy     GI/Hepatic hiatal hernia, PUD, GERD  Controlled and Medicated,  Endo/Other  diabetes  Renal/GU Lab Results      Component                Value               Date                      CREATININE               0.53 (L)            12/07/2020                BUN                      17                  12/07/2020                NA                       136                 12/07/2020                K                        4.2                 12/07/2020                CL                       103                 12/07/2020                CO2                      24                  12/07/2020                Musculoskeletal Cerebral Palsy Quadrapledgia   Abdominal   Peds  Hematology Lab Results      Component                Value               Date                      WBC                      11.2 (H)  12/07/2020                HGB                      11.0 (L)            12/07/2020                HCT                      34.9 (L)            12/07/2020                MCV                      76.0 (L)            12/07/2020                PLT                      543 (H)             12/07/2020              Anesthesia Other Findings R BKA Pt contracted   Reproductive/Obstetrics                           Anesthesia Physical Anesthesia Plan  ASA:  3  Anesthesia Plan: General   Post-op Pain Management:    Induction: Intravenous  PONV Risk Score and Plan: 2 and Treatment may vary due to age or medical condition and Ondansetron  Airway Management Planned: LMA and Oral ETT  Additional Equipment: None  Intra-op Plan:   Post-operative Plan: Extubation in OR  Informed Consent: I have reviewed the patients History and Physical, chart, labs and discussed the procedure including the risks, benefits and alternatives for the proposed anesthesia with the patient or authorized representative who has indicated his/her understanding and acceptance.     Dental advisory given  Plan Discussed with: CRNA and Anesthesiologist  Anesthesia Plan Comments:        Anesthesia Quick Evaluation

## 2020-12-07 NOTE — Clinical Social Work Note (Signed)
CSW attempted to contact patient's sister/legal guardian, but was unable to reach her. CSW left VM requesting call back.

## 2020-12-07 NOTE — Progress Notes (Addendum)
PROGRESS NOTE    [name]  ZOX:[id number] DOB: Jul 16, 1960 DOA: 12/06/2020 PCP: [place], MD   Brief Narrative: 59 year old male with cerebral palsy peptic ulcer disease, bipolar disorder, history of osteomyelitis, history of DVT, history of chronic pneumonia lives in a group home, he is quadriplegic except the use of his left he has right below-knee amputation he is admitted with worsening sacral wound with increased pain. CT of the abdomen and pelvis showed extensive soft tissue swelling in the left gluteal fold extending to the muscles compatible with cellulitis.  No drainable fluid collection present.  No sinus tract.  Large amount of stool within the rectum.  Right pleural effusion.  Cholelithiasis without evidence of cholecystitis.  Large hiatal hernia.  Assessment & Plan:   Principal Problem:   Cellulitis of sacral region Active Problems:   Intellectual disability   Cerebral palsy, quadriplegic (HCC)   Hx of right BKA (HCC)   Recurrent right pleural effusion   #1 infected sacral wound present on admission patient has had this wound for many months now with purulent drainage drainage and malodor with leukocytosis with surrounding erythema and necrotic base of the wound.  CT consistent with cellulitis. On vancomycin and Rocephin. Await wound care consult. Might need surgical debridement.will consult surgery.  #2 cerebral palsy with quadriplegia/intellectual disability continue supportive treatment and home meds.  #3 right BKA  #4 constipation CT with high stool burden continue stool softeners as ordered.  #5 pressure injuries as below present on admission  #6 hyperkalemia resolved   Pressure Injury Arm Left;Posterior;Proximal;Upper;Other (Comment) Stage II -  Partial thickness loss of dermis presenting as a shallow open ulcer with a red, pink wound bed without slough. pink open wound on armpit (Active)     Location: Arm  Location Orientation:  Left;Posterior;Proximal;Upper;Other (Comment)  Staging: Stage II -  Partial thickness loss of dermis presenting as a shallow open ulcer with a red, pink wound bed without slough.  Wound Description (Comments): pink open wound on armpit  Present on Admission: Yes     Pressure Injury 07/31/20 Foot Left;Medial Stage 3 -  Full thickness tissue loss. Subcutaneous fat may be visible but bone, tendon or muscle are NOT exposed. (Active)  07/31/20 2152  Location: Foot  Location Orientation: Left;Medial  Staging: Stage 3 -  Full thickness tissue loss. Subcutaneous fat may be visible but bone, tendon or muscle are NOT exposed.  Wound Description (Comments):   Present on Admission: Yes     Pressure Injury 08/01/20 Knee Left;Medial Stage 1 -  Intact skin with non-blanchable redness of a localized area usually over a bony prominence. (Active)  08/01/20   Location: Knee  Location Orientation: Left;Medial  Staging: Stage 1 -  Intact skin with non-blanchable redness of a localized area usually over a bony prominence.  Wound Description (Comments):   Present on Admission: Yes     Pressure Injury 08/01/20 Sacrum Right Unstageable - Full thickness tissue loss in which the base of the injury is covered by slough (yellow, tan, gray, green or brown) and/or eschar (tan, brown or black) in the wound bed. (Active)  08/01/20   Location: Sacrum  Location Orientation: Right  Staging: Unstageable - Full thickness tissue loss in which the base of the injury is covered by slough (yellow, tan, gray, green or brown) and/or eschar (tan, brown or black) in the wound bed.  Wound Description (Comments):   Present on Admission: Yes     Pressure Injury 10/06/20 Thigh Left;Lateral Stage  1 -  Intact skin with non-blanchable redness of a localized area usually over a bony prominence. Yellow wound bed (Active)  10/06/20 0800  Location: Thigh  Location Orientation: Left;Lateral  Staging: Stage 1 -  Intact skin with  non-blanchable redness of a localized area usually over a bony prominence.  Wound Description (Comments): Yellow wound bed  Present on Admission:      Pressure Injury 10/06/20 Thigh Right;Lateral Stage 1 -  Intact skin with non-blanchable redness of a localized area usually over a bony prominence. red nonblanchable (Active)  10/06/20 0800  Location: Thigh  Location Orientation: Right;Lateral  Staging: Stage 1 -  Intact skin with non-blanchable redness of a localized area usually over a bony prominence.  Wound Description (Comments): red nonblanchable  Present on Admission:     Estimated body mass index is 20.33 kg/m as calculated from the following:   Height as of this encounter: 6' (1.829 m).   Weight as of this encounter: 68 kg.  DVT prophylaxis: Lovenox  Code Status: Full code Family Communication: None at bedside Disposition Plan:  Status is: Inpatient  Remains inpatient appropriate because:IV treatments appropriate due to intensity of illness or inability to take PO  Dispo: The patient is from: Group home              Anticipated d/c is to: Group home              Patient currently is not medically stable to d/c.   Difficult to place patient No    Consultants:  WOUND CARE  Procedures: None Antimicrobials: Vanco and Rocephin  Subjective: Patient resting in bed on his right side complaining of knee pain  Objective: Vitals:   12/06/20 1500 12/06/20 2216 12/07/20 0214 12/07/20 0644  BP: 102/65 117/77 (!) 97/55 134/72  Pulse: 81 86 87 83  Resp: 19 17 18 18   Temp:  98.6 F (37 C) 98.4 F (36.9 C) 99.5 F (37.5 C)  TempSrc:  Oral Oral Oral  SpO2: 97% 97% 96% 96%  Weight:      Height:        Intake/Output Summary (Last 24 hours) at 12/07/2020 0911 Last data filed at 12/07/2020 0911 Gross per 24 hour  Intake 950 ml  Output 225 ml  Net 725 ml   Filed Weights   12/06/20 1148  Weight: 68 kg    Examination:  General exam: Appears calm and comfortable   Respiratory system: Clear to auscultation. Respiratory effort normal. Cardiovascular system: S1 & S2 heard, RRR. No JVD, murmurs, rubs, gallops or clicks. No pedal edema. Gastrointestinal system: Abdomen is nondistended, soft and nontender. No organomegaly or masses felt. Normal bowel sounds heard. Central nervous system: Awake oriented to hospital  extremities: Right leg below-knee amputation contractures of the right upper extremity Skin: Sacral wound with foul-smelling drainage surrounding area is erythematous Psychiatry: Judgement and insight appear normal. Mood & affect appropriate.     Data Reviewed: I have personally reviewed following labs and imaging studies  CBC: Recent Labs  Lab 12/06/20 1251 12/07/20 0023  WBC 12.6* 11.2*  NEUTROABS 8.9*  --   HGB 11.6* 11.0*  HCT 37.5* 34.9*  MCV 77.3* 76.0*  PLT 556* 543*   Basic Metabolic Panel: Recent Labs  Lab 12/06/20 1251 12/07/20 0023  NA 138 136  K 5.2* 4.2  CL 104 103  CO2 26 24  GLUCOSE 85 85  BUN 15 17  CREATININE 0.62 0.53*  CALCIUM 8.9 8.8*   GFR: Estimated Creatinine  Clearance: 94.4 mL/min (A) (by C-G formula based on SCr of 0.53 mg/dL (L)). Liver Function Tests: No results for input(s): AST, ALT, ALKPHOS, BILITOT, PROT, ALBUMIN in the last 168 hours. No results for input(s): LIPASE, AMYLASE in the last 168 hours. No results for input(s): AMMONIA in the last 168 hours. Coagulation Profile: No results for input(s): INR, PROTIME in the last 168 hours. Cardiac Enzymes: No results for input(s): CKTOTAL, CKMB, CKMBINDEX, TROPONINI in the last 168 hours. BNP (last 3 results) No results for input(s): PROBNP in the last 8760 hours. HbA1C: No results for input(s): HGBA1C in the last 72 hours. CBG: No results for input(s): GLUCAP in the last 168 hours. Lipid Profile: No results for input(s): CHOL, HDL, LDLCALC, TRIG, CHOLHDL, LDLDIRECT in the last 72 hours. Thyroid Function Tests: No results for input(s):  TSH, T4TOTAL, FREET4, T3FREE, THYROIDAB in the last 72 hours. Anemia Panel: No results for input(s): VITAMINB12, FOLATE, FERRITIN, TIBC, IRON, RETICCTPCT in the last 72 hours. Sepsis Labs: Recent Labs  Lab 12/06/20 1251  LATICACIDVEN 1.7    Recent Results (from the past 240 hour(s))  Resp Panel by RT-PCR (Flu A&B, Covid) Nasopharyngeal Swab     Status: None   Collection Time: 12/06/20  4:51 PM   Specimen: Nasopharyngeal Swab; Nasopharyngeal(NP) swabs in vial transport medium  Result Value Ref Range Status   SARS Coronavirus 2 by RT PCR NEGATIVE NEGATIVE Final    Comment: (NOTE) SARS-CoV-2 target nucleic acids are NOT DETECTED.  The SARS-CoV-2 RNA is generally detectable in upper respiratory specimens during the acute phase of infection. The lowest concentration of SARS-CoV-2 viral copies this assay can detect is 138 copies/mL. A negative result does not preclude SARS-Cov-2 infection and should not be used as the sole basis for treatment or other patient management decisions. A negative result may occur with  improper specimen collection/handling, submission of specimen other than nasopharyngeal swab, presence of viral mutation(s) within the areas targeted by this assay, and inadequate number of viral copies(<138 copies/mL). A negative result must be combined with clinical observations, patient history, and epidemiological information. The expected result is Negative.  Fact Sheet for Patients:  BloggerCourse.com  Fact Sheet for Healthcare Providers:  SeriousBroker.it  This test is no t yet approved or cleared by the [place] FDA and  has been authorized for detection and/or diagnosis of SARS-CoV-2 by FDA under an Emergency Use Authorization (EUA). This EUA will remain  in effect (meaning this test can be used) for the duration of the COVID-19 declaration under Section 564(b)(1) of the Act, 21 U.S.C.section 360bbb-3(b)(1),  unless the authorization is terminated  or revoked sooner.       Influenza A by PCR NEGATIVE NEGATIVE Final   Influenza B by PCR NEGATIVE NEGATIVE Final    Comment: (NOTE) The Xpert Xpress SARS-CoV-2/FLU/RSV plus assay is intended as an aid in the diagnosis of influenza from Nasopharyngeal swab specimens and should not be used as a sole basis for treatment. Nasal washings and aspirates are unacceptable for Xpert Xpress SARS-CoV-2/FLU/RSV testing.  Fact Sheet for Patients: BloggerCourse.com  Fact Sheet for Healthcare Providers: SeriousBroker.it  This test is not yet approved or cleared by the [place] FDA and has been authorized for detection and/or diagnosis of SARS-CoV-2 by FDA under an Emergency Use Authorization (EUA). This EUA will remain in effect (meaning this test can be used) for the duration of the COVID-19 declaration under Section 564(b)(1) of the Act, 21 U.S.C. section 360bbb-3(b)(1), unless the authorization is terminated or  revoked.  Performed at [facility], [address] [place], [place] [address]   Culture, blood (routine x 2)     Status: None (Preliminary result)   Collection Time: 12/06/20  4:51 PM   Specimen: BLOOD  Result Value Ref Range Status   Specimen Description   Final    BLOOD BLOOD LEFT HAND Performed at [facility], [address] [place], [place] [address]    Special Requests   Final    BOTTLES DRAWN AEROBIC AND ANAEROBIC Blood Culture adequate volume Performed at [facility], [address], [place], [place] [address]    Culture   Final    NO GROWTH < 24 HOURS Performed at [facility], [address] [place], [place] [address]    Report Status PENDING  Incomplete  Culture, blood (routine x 2)     Status: None (Preliminary result)   Collection Time: 12/07/20 12:23 AM   Specimen: Left Antecubital; Blood  Result  Value Ref Range Status   Specimen Description   Final    LEFT ANTECUBITAL Performed at [facility], [address], [place], [place] [address]    Special Requests   Final    Blood Culture adequate volume BOTTLES DRAWN AEROBIC ONLY Performed at [facility], [address] [place], [place] [address]    Culture PENDING  Incomplete   Report Status PENDING  Incomplete         Radiology Studies: CT ABDOMEN PELVIS W CONTRAST  Result Date: 12/06/2020 CLINICAL DATA:  Sacral pressure ulcer.  Question wound dehiscence. EXAM: CT ABDOMEN AND PELVIS WITH CONTRAST TECHNIQUE: Multidetector CT imaging of the abdomen and pelvis was performed using the standard protocol following bolus administration of intravenous contrast. CONTRAST:  80mL OMNIPAQUE IOHEXOL 350 MG/ML SOLN COMPARISON:  CT abdomen pelvis 09/05/2020 FINDINGS: Lower chest: Patient scanned in the right lateral decubitus position. Left lung is clear. L hepatic changes noted in the right lung. Right pleural effusion noted. Heart size is normal. Hepatobiliary: Dependent stones present in the gallbladder fundus. No inflammatory change. Is some edema about the bile ducts. Obstruction present. Pancreas: Unremarkable. No pancreatic ductal dilatation or surrounding inflammatory changes. Spleen: Normal in size without focal abnormality. Adrenals/Urinary Tract: Adrenal glands are unremarkable. Kidneys are normal, without renal calculi, focal lesion, or hydronephrosis. Bladder is unremarkable. Stomach/Bowel: Large hiatal hernia again noted. Distal stomach and duodenum within normal limits. Small bowel unremarkable. Proximal colon within normal limits. Large amount of stool present within the rectum. Transverse diameter up to 8 cm. Extensive soft tissue swelling noted in the left gluteal fold extending to the muscles. No drainable fluid collection is present. No sinus tract evident. No intra or extraperitoneal fluid or gas evident.  Vascular/Lymphatic: Atherosclerotic calcifications present in aorta branch vessels without aneurysm. Reproductive: Prostate is unremarkable. Other: No abdominal wall hernia or abnormality. No abdominopelvic ascites. Musculoskeletal: Lytic blastic lesions are present. Facet degenerative changes are noted in the lower lumbar spine. Bony pelvis within normal limits. IMPRESSION: 1. Extensive soft tissue swelling in the left gluteal fold extending to the muscles compatible with cellulitis. No drainable fluid collection is present. 2. No sinus tract evident. 3. Large amount of stool within the rectum. 4. Right pleural effusion. 5. Cholelithiasis without evidence for cholecystitis. 6. Large hiatal hernia. 7. Aortic Atherosclerosis (ICD10-I70.0). Electronically Signed   By: Marin Roberts M.D.   On: 12/06/2020 15:17        Scheduled Meds:  baclofen  20 mg Oral TID   collagenase  1 application Topical Daily   diclofenac Sodium  4 g Topical QID   divalproex  500 mg Oral 2 times per day   enoxaparin (LOVENOX) injection  40 mg Subcutaneous Q24H   feeding supplement  237 mL Oral BID BM   fluticasone  1 spray Each Nare Daily   iron polysaccharides  150 mg Oral Q1200   loratadine  10 mg Oral Daily   tretinoin  1 application Topical See admin instructions   Continuous Infusions:  cefTRIAXone (ROCEPHIN)  IV 2 g (12/07/20 0157)   vancomycin       LOS: 1 day    Time spent: 37 MIN    Alwyn Ren, MD  12/07/2020, 9:11 AM

## 2020-12-07 NOTE — Consult Note (Signed)
WOC Nurse Consult Note: Reason for Consult: Patient is known to our team from previous admissions.  Unstageable pressure injury to sacrum. Photo in chart in Media Tab appreciated. I have discussed this patient with Dr. Jerolyn Center and we have agreed upon a POC. She is considering a simultaneous consultation with CCS. I welcome their input and oversight. Left foot, right hip and ear wounds.  Bedside RN reports that patient is not willing to reposition off of the right side despite education regarding pressure injuries on the right ear, hip. Wound type:Pressure, infection Pressure Injury POA: Yes Measurement:Bedside RN to measure wounds and document measurements in Nursing Flow Sheet prior to placement of first dressing change today. Wound bed: 65% red, moist, 35% black nonviable eschar with attached edge Drainage (amount, consistency, odor) serous Periwound: erythematous Dressing procedure/placement/frequency: I will implement the following interventions via the Orders for the sacrum: Mattress replacement with low air loss feature Float heels Turn and reposition per house protocol Wound care:  Daily and PRN soiling.  Cleanse with NS, pat dry. Apply collagenase (Santyl) in a 1/8 inch layer to nonviable tissue.  Top wound and collagenase with saline moistened gauze. Cover with dry gauze, ABD and secure with tape.  PT Hydrotherapy:  Recommended assessment/evaluation for hydrotherapy with frequency dependent on their expertise, but to consider 3xW with conservative sharp wound debridement (CSWD) performed post treatments. Recommend CCS consultation for surgical debridement vs following up with patient during PT treatments..  Other wounds on right hip, right ear and right thigh are related to patients preference for positioning only on that side.  Hopefully with mattress replacement and medication, patient will tolerated offloading to the supine and left side. Topical care to these areas (Stage 2) will be with  a NS cleanse followed by placement of xeroform gauze and covering with dry gauze and silicone foam. The left foot wounds are presenting as purple discolorations to the anterior foot and medial malleolus.  The skin is edematous and dry.These wounds will be managed conservatively with xeroform gauze and silicone foam with monitoring twice daily. Eucerin cream is added to the left LE.  WOC nursing team will not follow, but will remain available to this patient, the nursing and medical teams.  Please re-consult if needed. Thanks, Ladona Mow, MSN, RN, GNP, [place]  Pager# [phone]

## 2020-12-07 NOTE — Consult Note (Signed)
[facility] Consult Note  [name] 12-21-1960  [id number].    Requesting MD: Dr. Blanchard Mane Chief Complaint: Sacral wound Reason for Consult: Sacral wound HPI:  Patient is a 60 year old male with medical history of vertebral problems he with quadriplegia.  He is also had a right BKA.  Brought to the emergency room with a progressive sacral wound that was becoming more painful.  Pain extends from the wound up to his back.  Nothing was improving his pain and he was brought to the emergency department for further evaluation and treatment.  He has an extensive list of additional medical issues see below. Work-up in the ED shows T-max 99.5, vital signs are stable. WBC 12.6, hemoglobin 11.6, hematocrit 37.5, platelets 556,000. BMP this a.m. shows potassium 4.2, down from 5.2 on admission.  Glucose 85, creatinine 0.53, calcium 8.8.  He is COVID-negative.  Blood cultures are pending. ROS: Review of Systems  Constitutional:  Negative for chills, fever, malaise/fatigue and weight loss.  HENT: Negative.    Eyes: Negative.   Respiratory: Negative.    Cardiovascular: Negative.   Gastrointestinal: Negative.   Genitourinary: Negative.   Musculoskeletal:        Significant contracture all extremities RBKA well healed  Skin:        He has pressure sores all over.    Neurological:        Severe CP bed bound. Multiple bedsores  Endo/Heme/Allergies: Negative.   Psychiatric/Behavioral:  The patient is nervous/anxious.    Family History  Adopted: Yes  Problem Relation Age of Onset   Colon cancer Neg Hx     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   Anemia    microcytic anemia    Anxiety    takes Ativan daily as needed   Bipolar disorder (HCC)    takes Depakote daily   CEREBRAL PALSY 07/24/2006   Chronic bronchitis (HCC)    "gets it q yr"   Chronic bronchitis (HCC)    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)     Constipation    takes Miralax daily as needed   Depression    takes Risperdal nightly   Environmental allergies    takes Zyrtec daily   GASTRIC ULCER ACUTE WITH HEMORRHAGE 07/24/2006   Gastric ulcer with hemorrhage    hx of    GERD (gastroesophageal reflux disease)    takes Pravacid daily   History of blood transfusion    no abnormal reaction noted   History of hiatal hernia    History of shingles    History of stomach ulcers ~ 1996   Hypercholesterolemia    Intellectual functioning disability    Intermittent explosive disorder    Internal hemorrhoids    MRSA infection 09/06/2019   Obsessive compulsive disorder    Osteomyelitis (HCC)    right foot    Pneumonia "quite a few times"   in 2015 and in June 2016   Pressure ulcer of sacral region    hx of    Pseudomonas aeruginosa infection 09/06/2019   Psychotic disorder (HCC)    Recurrent UTI (urinary tract infection)    Seborrheic dermatitis    Sigmoid diverticulosis    Spastic tetraplegia (HCC)    URINARY INCONTINENCE 02/09/2010   takes Detrol daily-occasionally incontinence   Vitamin D deficiency    Weakness    numbness in extremities    Past Surgical History:  Procedure Laterality Date  AMPUTATION Right 12/14/2014   Procedure: AMPUTATION DIGIT RIGHT GREAT TOE MTP;  Surgeon: Nadara Mustard, MD;  Location: [facility] OR;  Service: Orthopedics;  Laterality: Right;   AMPUTATION Right 10/08/2019   Procedure: RIGHT FIFTH RAY AMPUTATION, ADJACENT TISSUE TRANSFER;  Surgeon: Park Liter, DPM;  Location: [facility] ORS;  Service: Podiatry;  Laterality: Right;   AMPUTATION Right 07/27/2020   Procedure: AMPUTATION BELOW KNEE;  Surgeon: Toni Arthurs, MD;  Location: [facility] OR;  Service: Orthopedics;  Laterality: Right;    BIOPSY  08/12/2018   Procedure: BIOPSY;  Surgeon: Lynann Bologna, MD;  Location: [facility];  Service: Gastroenterology;;   COLONOSCOPY WITH PROPOFOL N/A 08/14/2018   Procedure: COLONOSCOPY WITH PROPOFOL;  Surgeon: Lynann Bologna, MD;  Location: [facility];  Service: Gastroenterology;  Laterality: N/A;   ESOPHAGOGASTRODUODENOSCOPY (EGD) WITH PROPOFOL N/A 08/12/2018   Procedure: ESOPHAGOGASTRODUODENOSCOPY (EGD) WITH PROPOFOL;  Surgeon: Lynann Bologna, MD;  [facility]: [facility];  Service: Gastroenterology;  Laterality: N/A;   FLEXIBLE SIGMOIDOSCOPY N/A 11/09/2020   Procedure: FLEXIBLE SIGMOIDOSCOPY;  Surgeon: [name], MD;  Location: [facility];  Service: Gastroenterology;  Laterality: N/A;   GRAFT APPLICATION Right 11/12/2019   Procedure: SKIN GRAFT SUBSTITUTE APPLICATION;  Surgeon: Park Liter, DPM;  Location: [facility];  Service: Podiatry;  Laterality: Right;   HERNIA REPAIR     HIATAL HERNIA REPAIR  1982   LEG SURGERY Bilateral ~ 1967   "legs were scissored; stretched them out"   WOUND DEBRIDEMENT Right 11/12/2019   Procedure: DEBRIDEMENT WOUND;  Surgeon: Park Liter, DPM;  Location: [facility];  Service: Podiatry;  Laterality: Right;    Social History:  reports that he has never smoked. He has never used smokeless tobacco. He reports that he does not drink alcohol and does not use drugs.  Allergies:  Allergies  Allergen Reactions   Adhesive [Tape] Rash    Medications Prior to Admission  Medication Sig Dispense Refill   acetaminophen (TYLENOL) 500 MG tablet Take 500 mg by mouth every 6 (six) hours as needed (for aches, pains, and/or fevers).     albuterol (VENTOLIN HFA) 108 (90 Base) MCG/ACT inhaler Inhale 2 puffs into the lungs every 4 (four) hours as needed for shortness of breath or wheezing.     alum & mag hydroxide-simeth (MAALOX/MYLANTA) 200-200-20 MG/5ML suspension Take 15-30 mLs by mouth every 6 (six) hours as needed for indigestion or heartburn (or nausea).     baclofen (LIORESAL) 20 MG tablet Take 1 tablet (20 mg total) by mouth 3 (three) times daily. 90 each 1   bisacodyl (DULCOLAX) 10 MG suppository Place 1 suppository (10 mg total) rectally daily as needed for moderate  constipation. (Patient taking differently: Place 10 mg rectally See admin instructions. Insert 1 suppository rectally every other day and and once a day as needed for moderate constipation) 12 suppository 0   brompheniramine-pseudoephedrine (DIMETAPP) 1-15 MG/5ML ELIX Take 10 mLs by mouth every 8 (eight) hours as needed (for cold symptoms).     cetirizine (ZYRTEC) 10 MG tablet Take 1 tablet (10 mg total) by mouth daily. (Patient taking differently: Take 10 mg by mouth at bedtime.) 30 tablet 3   divalproex (DEPAKOTE SPRINKLE) 125 MG capsule Take 500 mg by mouth 2 (two) times daily. (0700 & 2000)     Ensure (ENSURE) Take 237 mLs by mouth 2 (two) times daily between meals.     FERREX 150 150 MG capsule Take 150 mg by mouth daily at 12 noon.  fluticasone (FLONASE) 50 MCG/ACT nasal spray Place 1 spray into both nostrils daily. (0700)     guaiFENesin-dextromethorphan (ROBITUSSIN DM) 100-10 MG/5ML syrup Take 10 mLs by mouth every 4 (four) hours as needed for cough.      hydrocortisone cream 1 % APPLY TOPICALLY AT BEDTIME AS NEEDED FOR ITCHING (Patient taking differently: Apply 1 application topically See admin instructions. Apply a thin later to affected areas daily as directed) 28 g PRN   Lido-PE-Glycerin-Petrolatum (PREPARATION H RAPID RELIEF) 5-0.25-14.4-15 % CREA Apply 1 application topically See admin instructions. Apply a thin layer topically to the affected area 2 times a day for hemorrhoids     Neomycin-Bacitracin-Polymyxin (TRIPLE ANTIBIOTIC EX) Apply 1 application topically daily as needed (cuts/scrapes/scratches).     ondansetron (ZOFRAN) 4 MG tablet Take 1 tablet (4 mg total) by mouth every 6 (six) hours as needed for nausea. 20 tablet 0   polyethylene glycol powder (GLYCOLAX/MIRALAX) 17 GM/SCOOP powder Take 17 g by mouth See admin instructions. Mix 17 grams as directed every morning and once daily as needed for constipation     SANTYL ointment Apply 1 application topically See admin  instructions. Apply to wound in the sacral area once a day     tretinoin (RETIN-A) 0.025 % gel Apply 1 application topically See admin instructions. Apply to the affected areas of the face at bedtime     VOLTAREN 1 % GEL APPLY 4 GRAMS TOPICALLY TO AFFECTED AREA(S) FOUR TIMES DAILY (Patient taking differently: Apply 4 g topically See admin instructions. Apply 4 grams to affected areas four times a day) 100 g 11   Zinc Oxide 40 % PSTE Apply 1 application topically 2 (two) times daily as needed (unspecified). Desitin Max strength paste apply thin layer topically to buttocks and scrotum (Patient taking differently: Apply 1 application topically 2 (two) times daily as needed (unspecified). Desitin Max strength paste- Apply a thin layer topically to buttocks and scrotum 2 times a day as needed for irritation) 113 g PRN   Cholecalciferol (VITAMIN D3) 5000 units CAPS Take 5,000 Units by mouth in the morning. (0700) (Patient not taking: Reported on 12/06/2020)     clotrimazole (LOTRIMIN) 1 % cream Apply 1 application topically 2 (two) times daily. (0700 & 1800) (Patient not taking: Reported on 12/06/2020)     clotrimazole-betamethasone (LOTRISONE) cream Apply 1 application topically 2 (two) times daily. (0800 & 2000) (Patient not taking: Reported on 12/06/2020)     Foot Care Products (PREVALON HEEL PROTECTOR) MISC 1 Device by Does not apply route daily. 2 each 0   ketoconazole (NIZORAL) 2 % shampoo APPLY TOPICALLY TO AFFECTED AREA(S) TWICE WEEKLY AS DIRECTED (Patient not taking: Reported on 12/06/2020) 120 mL 4   LORazepam (ATIVAN) 2 MG tablet Take 1 mg by mouth every 4 (four) hours as needed for anxiety. (Patient not taking: Reported on 12/06/2020)     magnesium citrate SOLN Use as directed (Patient not taking: Reported on 12/06/2020) 195 mL 0   omeprazole (PRILOSEC) 40 MG capsule Take 1 capsule (40 mg total) by mouth 2 (two) times daily. (Patient not taking: Reported on 12/06/2020) 60 capsule 0   Oyster Shell (OYSTER  CALCIUM) 500 MG TABS tablet Take 1,500 mg of elemental calcium by mouth daily. (Patient not taking: Reported on 12/06/2020)     risperiDONE (RISPERDAL) 2 MG tablet Take 2 mg by mouth at bedtime. (2100) (Patient not taking: Reported on 12/06/2020)     senna-docusate (SENOKOT-S) 8.6-50 MG tablet Take 1 tablet by mouth at  bedtime. (Patient not taking: Reported on 12/06/2020) 30 tablet 0   sodium phosphate (FLEET) 7-19 GM/118ML ENEM Use as directed (Patient not taking: Reported on 12/06/2020) 1 mL 0   tamsulosin (FLOMAX) 0.4 MG CAPS capsule TAKE 1 CAPSULE BY MOUTH EVERY DAY *DO NOT CRUSH* (Patient not taking: Reported on 12/06/2020) 30 capsule 2   tolterodine (DETROL LA) 2 MG 24 hr capsule Take 1 capsule (2 mg total) by mouth daily. (Patient not taking: Reported on 12/06/2020) 90 capsule 3   vitamin C (VITAMIN C) 250 MG tablet Take 1 tablet (250 mg total) by mouth daily. (Patient not taking: Reported on 12/06/2020) 30 tablet 0    Blood pressure 129/78, pulse (!) 103, temperature 99.2 F (37.3 C), temperature source Oral, resp. rate 18, height 6' (1.829 m), weight 68 kg, SpO2 98 %. Physical Exam:  General: pleasant, cachectic WM with significant contractures. Bed sore over a large portion of his body including his ears. HEENT: head is normocephalic, atraumatic.  Sclera are noninjected.  Pupils are equal.  Ears and nose without any masses or some skin changes.  Mouth is pink and moist Heart: regular, rate, and rhythm.  Normal s1,s2. No obvious murmurs, gallops, or rubs noted.  Palpable radial and Right BKa, left foot swollen with contractures, difficult to fine pulse Lungs: CTAB, no wheezes, rhonchi, or rales noted.  Respiratory effort nonlabored Abd: soft, NT, ND, +BS, no masses, hernias, or organomegaly, hernia scar DD:UKGURK contracture with CP.  RBKA well healed Skin: warm, he has multiple pressure sores covering most portions of his body.  He has a right sacral decubitus that is 2 x 2 x 2 cm.  It is stage  IV. He has some skin break down around it stage II.  The wound is malodorus and painful, nothing makes it more comfortable, even when he is not on it.   Neuro: Cranial nerves 2-12 grossly intact, sensation is normal throughout Psych: A&Ox3 with an appropriate affect for his chronic state  Results for orders placed or performed during the hospital encounter of 12/06/20 (from the past 48 hour(s))  CBC with Differential     Status: Abnormal   Collection Time: 12/06/20 12:51 PM  Result Value Ref Range   WBC 12.6 (H) 4.0 - 10.5 K/uL   RBC 4.85 4.22 - 5.81 MIL/uL   Hemoglobin 11.6 (L) 13.0 - 17.0 g/dL   HCT 27.0 (L) 62.3 - 76.2 %   MCV 77.3 (L) 80.0 - 100.0 fL   MCH 23.9 (L) 26.0 - 34.0 pg   MCHC 30.9 30.0 - 36.0 g/dL   RDW 83.1 (H) 51.7 - 61.6 %   Platelets 556 (H) 150 - 400 K/uL   nRBC 0.0 0.0 - 0.2 %   Neutrophils Relative % 70 %   Neutro Abs 8.9 (H) 1.7 - 7.7 K/uL   Lymphocytes Relative 16 %   Lymphs Abs 2.1 0.7 - 4.0 K/uL   Monocytes Relative 11 %   Monocytes Absolute 1.3 (H) 0.1 - 1.0 K/uL   Eosinophils Relative 1 %   Eosinophils Absolute 0.1 0.0 - 0.5 K/uL   Basophils Relative 1 %   Basophils Absolute 0.1 0.0 - 0.1 K/uL   Immature Granulocytes 1 %   Abs Immature Granulocytes 0.13 (H) 0.00 - 0.07 K/uL    Comment: Performed at [facility], [address] [place], [place] [address]  Basic metabolic panel     Status: Abnormal   Collection Time: 12/06/20 12:51 PM  Result Value  Ref Range   Sodium 138 135 - 145 mmol/L   Potassium 5.2 (H) 3.5 - 5.1 mmol/L   Chloride 104 98 - 111 mmol/L   CO2 26 22 - 32 mmol/L   Glucose, Bld 85 70 - 99 mg/dL    Comment: Glucose reference range applies only to samples taken after fasting for at least 8 hours.   BUN 15 6 - 20 mg/dL   Creatinine, Ser 0.13 0.61 - 1.24 mg/dL   Calcium 8.9 8.9 - 14.3 mg/dL   GFR, Estimated >88 >87 mL/min    Comment: (NOTE) Calculated using the CKD-EPI Creatinine Equation (2021)    Anion gap 8 5 -  15    Comment: Performed at [facility], [address], [place], [place] [address]  Lactic acid, plasma     Status: None   Collection Time: 12/06/20 12:51 PM  Result Value Ref Range   Lactic Acid, Venous 1.7 0.5 - 1.9 mmol/L    Comment: Performed at [facility], [address], [place], [place] [address]  Resp Panel by RT-PCR (Flu A&B, Covid) Nasopharyngeal Swab     Status: None   Collection Time: 12/06/20  4:51 PM   Specimen: Nasopharyngeal Swab; Nasopharyngeal(NP) swabs in vial transport medium  Result Value Ref Range   SARS Coronavirus 2 by RT PCR NEGATIVE NEGATIVE    Comment: (NOTE) SARS-CoV-2 target nucleic acids are NOT DETECTED.  The SARS-CoV-2 RNA is generally detectable in upper respiratory specimens during the acute phase of infection. The lowest concentration of SARS-CoV-2 viral copies this assay can detect is 138 copies/mL. A negative result does not preclude SARS-Cov-2 infection and should not be used as the sole basis for treatment or other patient management decisions. A negative result may occur with  improper specimen collection/handling, submission of specimen other than nasopharyngeal swab, presence of viral mutation(s) within the areas targeted by this assay, and inadequate number of viral copies(<138 copies/mL). A negative result must be combined with clinical observations, patient history, and epidemiological information. The expected result is Negative.  Fact Sheet for Patients:  BloggerCourse.com  Fact Sheet for Healthcare Providers:  SeriousBroker.it  This test is no t yet approved or cleared by the [place] FDA and  has been authorized for detection and/or diagnosis of SARS-CoV-2 by FDA under an Emergency Use Authorization (EUA). This EUA will remain  in effect (meaning this test can be used) for the duration of the COVID-19 declaration under Section 564(b)(1)  of the Act, 21 U.S.C.section 360bbb-3(b)(1), unless the authorization is terminated  or revoked sooner.       Influenza A by PCR NEGATIVE NEGATIVE   Influenza B by PCR NEGATIVE NEGATIVE    Comment: (NOTE) The Xpert Xpress SARS-CoV-2/FLU/RSV plus assay is intended as an aid in the diagnosis of influenza from Nasopharyngeal swab specimens and should not be used as a sole basis for treatment. Nasal washings and aspirates are unacceptable for Xpert Xpress SARS-CoV-2/FLU/RSV testing.  Fact Sheet for Patients: BloggerCourse.com  Fact Sheet for Healthcare Providers: SeriousBroker.it  This test is not yet approved or cleared by the [place] FDA and has been authorized for detection and/or diagnosis of SARS-CoV-2 by FDA under an Emergency Use Authorization (EUA). This EUA will remain in effect (meaning this test can be used) for the duration of the COVID-19 declaration under Section 564(b)(1) of the Act, 21 U.S.C. section 360bbb-3(b)(1), unless the authorization is terminated or revoked.  Performed at [facility], [address] [place], [place] [address]   Culture, blood (routine x 2)     Status: None (Preliminary result)   Collection Time: 12/06/20  4:51 PM   Specimen: BLOOD  Result Value Ref Range   Specimen Description      BLOOD BLOOD LEFT HAND Performed at [facility], [address] [place], [place] [address]    Special Requests      BOTTLES DRAWN AEROBIC AND ANAEROBIC Blood Culture adequate volume Performed at [facility], [address] [place], [place] [address]    Culture      NO GROWTH < 24 HOURS Performed at [facility], [address] [place], [place] [address]    Report Status PENDING   Culture, blood (routine x 2)     Status: None (Preliminary result)   Collection Time: 12/07/20 12:23 AM   Specimen: Left Antecubital; Blood  Result  Value Ref Range   Specimen Description      LEFT ANTECUBITAL Performed at [facility], [address] [place], [place] [address]    Special Requests      Blood Culture adequate volume BOTTLES DRAWN AEROBIC ONLY Performed at [facility], [address] [place], [place] [address]    Culture PENDING    Report Status PENDING   Basic metabolic panel     Status: Abnormal   Collection Time: 12/07/20 12:23 AM  Result Value Ref Range   Sodium 136 135 - 145 mmol/L   Potassium 4.2 3.5 - 5.1 mmol/L    Comment: DELTA CHECK NOTED   Chloride 103 98 - 111 mmol/L   CO2 24 22 - 32 mmol/L   Glucose, Bld 85 70 - 99 mg/dL    Comment: Glucose reference range applies only to samples taken after fasting for at least 8 hours.   BUN 17 6 - 20 mg/dL   Creatinine, Ser 5.780.53 (L) 0.61 - 1.24 mg/dL   Calcium 8.8 (L) 8.9 - 10.3 mg/dL   GFR, Estimated >46>60 >96>60 mL/min    Comment: (NOTE) Calculated using the CKD-EPI Creatinine Equation (2021)    Anion gap 9 5 - 15    Comment: Performed at [facility], [address] [place], [place] [address]  CBC     Status: Abnormal   Collection Time: 12/07/20 12:23 AM  Result Value Ref Range   WBC 11.2 (H) 4.0 - 10.5 K/uL   RBC 4.59 4.22 - 5.81 MIL/uL   Hemoglobin 11.0 (L) 13.0 - 17.0 g/dL   HCT 41.334.9 (L) 24.439.0 - 01.052.0 %   MCV 76.0 (L) 80.0 - 100.0 fL   MCH 24.0 (L) 26.0 - 34.0 pg   MCHC 31.5 30.0 - 36.0 g/dL   RDW 27.218.6 (H) 53.611.5 - 64.415.5 %   Platelets 543 (H) 150 - 400 K/uL   nRBC 0.0 0.0 - 0.2 %    Comment: Performed at [facility], [address] [place], [place] [address]   CT ABDOMEN PELVIS W CONTRAST  Result Date: 12/06/2020 CLINICAL DATA:  Sacral pressure ulcer.  Question wound dehiscence. EXAM: CT ABDOMEN AND PELVIS WITH CONTRAST TECHNIQUE: Multidetector CT imaging of the abdomen and pelvis was performed using the standard protocol following bolus administration of intravenous contrast. CONTRAST:   80mL OMNIPAQUE IOHEXOL 350 MG/ML SOLN COMPARISON:  CT abdomen pelvis 09/05/2020 FINDINGS: Lower chest: Patient scanned in the right lateral decubitus position. Left lung is clear. L hepatic changes noted in the right lung.  Right pleural effusion noted. Heart size is normal. Hepatobiliary: Dependent stones present in the gallbladder fundus. No inflammatory change. Is some edema about the bile ducts. Obstruction present. Pancreas: Unremarkable. No pancreatic ductal dilatation or surrounding inflammatory changes. Spleen: Normal in size without focal abnormality. Adrenals/Urinary Tract: Adrenal glands are unremarkable. Kidneys are normal, without renal calculi, focal lesion, or hydronephrosis. Bladder is unremarkable. Stomach/Bowel: Large hiatal hernia again noted. Distal stomach and duodenum within normal limits. Small bowel unremarkable. Proximal colon within normal limits. Large amount of stool present within the rectum. Transverse diameter up to 8 cm. Extensive soft tissue swelling noted in the left gluteal fold extending to the muscles. No drainable fluid collection is present. No sinus tract evident. No intra or extraperitoneal fluid or gas evident. Vascular/Lymphatic: Atherosclerotic calcifications present in aorta branch vessels without aneurysm. Reproductive: Prostate is unremarkable. Other: No abdominal wall hernia or abnormality. No abdominopelvic ascites. Musculoskeletal: Lytic blastic lesions are present. Facet degenerative changes are noted in the lower lumbar spine. Bony pelvis within normal limits. IMPRESSION: 1. Extensive soft tissue swelling in the left gluteal fold extending to the muscles compatible with cellulitis. No drainable fluid collection is present. 2. No sinus tract evident. 3. Large amount of stool within the rectum. 4. Right pleural effusion. 5. Cholelithiasis without evidence for cholecystitis. 6. Large hiatal hernia. 7. Aortic Atherosclerosis (ICD10-I70.0). Electronically Signed   By:  Marin Roberts M.D.   On: 12/06/2020 15:17      Assessment/Plan  Stage IV necrotic right sacral ulcer   FEN:  Regular diet3 ID: Ancef x 1; Ceftriaxone/vancomycin 7/14 day 1 DVT:  Lovenox  Cerebral palsy with quadriplegia Severe multiple contractures Bedbound Right BKA Hx Bipolar disorder/Anxiety/intellectual functional disability Hx Hiatal hernia  Plan:  Reviewed by Dr. Sheliah Hatch and we will plan to take him to the OR tomorrow and debride the sacral site.       Sherrie George [facility] Surgery 12/07/2020, 10:59 AM Please see Amion for pager number during day hours 7:00am-4:30pm

## 2020-12-08 ENCOUNTER — Encounter (HOSPITAL_COMMUNITY): Payer: Self-pay | Admitting: Internal Medicine

## 2020-12-08 ENCOUNTER — Encounter (HOSPITAL_COMMUNITY)
Admission: EM | Disposition: A | Discharge status: Skilled Nursing Facility | Payer: Self-pay | Source: Skilled Nursing Facility | Attending: Internal Medicine

## 2020-12-08 ENCOUNTER — Inpatient Hospital Stay (HOSPITAL_COMMUNITY): Payer: Medicare Other | Admitting: Anesthesiology

## 2020-12-08 HISTORY — PX: INCISION AND DRAINAGE ABSCESS: SHX5864

## 2020-12-08 LAB — BLOOD CULTURE ID PANEL (REFLEXED) - BCID2

## 2020-12-08 SURGERY — INCISION AND DRAINAGE, ABSCESS
Anesthesia: General | Site: Coccyx

## 2020-12-08 MED ORDER — FENTANYL CITRATE (PF) 250 MCG/5ML IJ SOLN
INTRAMUSCULAR | Status: DC | PRN
  Administered 2020-12-08: 25 ug via INTRAVENOUS

## 2020-12-08 MED ORDER — PHENYLEPHRINE HCL (PRESSORS) 10 MG/ML IV SOLN
INTRAVENOUS | Status: AC
  Filled 2020-12-08: qty 1

## 2020-12-08 MED ORDER — ONDANSETRON HCL 4 MG/2ML IJ SOLN
INTRAMUSCULAR | Status: DC | PRN
  Administered 2020-12-08: 4 mg via INTRAVENOUS

## 2020-12-08 MED ORDER — VANCOMYCIN HCL 750 MG/150ML IV SOLN
750.0000 mg | Freq: Two times a day (BID) | INTRAVENOUS | Status: DC
  Administered 2020-12-08 – 2020-12-11 (×7): 750 mg via INTRAVENOUS
  Filled 2020-12-08 (×8): qty 150

## 2020-12-08 MED ORDER — ROCURONIUM BROMIDE 10 MG/ML (PF) SYRINGE
PREFILLED_SYRINGE | INTRAVENOUS | Status: AC
  Filled 2020-12-08: qty 10

## 2020-12-08 MED ORDER — LIDOCAINE 2% (20 MG/ML) 5 ML SYRINGE
INTRAMUSCULAR | Status: DC | PRN
  Administered 2020-12-08: 50 mg via INTRAVENOUS

## 2020-12-08 MED ORDER — ACETAMINOPHEN 10 MG/ML IV SOLN
INTRAVENOUS | Status: AC
  Filled 2020-12-08: qty 100

## 2020-12-08 MED ORDER — FENTANYL CITRATE (PF) 100 MCG/2ML IJ SOLN
INTRAMUSCULAR | Status: AC
  Filled 2020-12-08: qty 2

## 2020-12-08 MED ORDER — AMISULPRIDE (ANTIEMETIC) 5 MG/2ML IV SOLN: 10.0000 mg | Freq: Once | INTRAVENOUS | Status: DC | PRN

## 2020-12-08 MED ORDER — OXYCODONE HCL 5 MG PO TABS: 5.0000 mg | ORAL_TABLET | Freq: Once | ORAL | Status: DC | PRN

## 2020-12-08 MED ORDER — ONDANSETRON HCL 4 MG/2ML IJ SOLN: 4.0000 mg | Freq: Once | INTRAMUSCULAR | Status: DC | PRN

## 2020-12-08 MED ORDER — MIDAZOLAM HCL 2 MG/2ML IJ SOLN
INTRAMUSCULAR | Status: DC | PRN
  Administered 2020-12-08: 1 mg via INTRAVENOUS

## 2020-12-08 MED ORDER — SODIUM CHLORIDE 0.9 % IV SOLN
INTRAVENOUS | Status: AC
  Filled 2020-12-08: qty 2

## 2020-12-08 MED ORDER — ACETAMINOPHEN 10 MG/ML IV SOLN
1000.0000 mg | Freq: Once | INTRAVENOUS | Status: DC | PRN
  Administered 2020-12-08: 1000 mg via INTRAVENOUS

## 2020-12-08 MED ORDER — LACTATED RINGERS IV SOLN: INTRAVENOUS | Status: DC

## 2020-12-08 MED ORDER — HYDROMORPHONE HCL 1 MG/ML IJ SOLN
INTRAMUSCULAR | Status: AC
  Filled 2020-12-08: qty 1

## 2020-12-08 MED ORDER — HYDROMORPHONE HCL 1 MG/ML IJ SOLN
0.2500 mg | INTRAMUSCULAR | Status: DC | PRN
  Administered 2020-12-08 (×2): 0.5 mg via INTRAVENOUS

## 2020-12-08 MED ORDER — MIDAZOLAM HCL 2 MG/2ML IJ SOLN
INTRAMUSCULAR | Status: AC
  Filled 2020-12-08: qty 2

## 2020-12-08 MED ORDER — PROPOFOL 10 MG/ML IV BOLUS
INTRAVENOUS | Status: AC
  Filled 2020-12-08: qty 20

## 2020-12-08 MED ORDER — LIDOCAINE 2% (20 MG/ML) 5 ML SYRINGE
INTRAMUSCULAR | Status: AC
  Filled 2020-12-08: qty 5

## 2020-12-08 MED ORDER — FENTANYL CITRATE (PF) 250 MCG/5ML IJ SOLN
INTRAMUSCULAR | Status: AC
  Filled 2020-12-08: qty 5

## 2020-12-08 MED ORDER — MUPIROCIN 2 % EX OINT
TOPICAL_OINTMENT | Freq: Two times a day (BID) | CUTANEOUS | Status: DC
  Administered 2020-12-09 – 2020-12-14 (×6): 1 via NASAL
  Filled 2020-12-08 (×3): qty 22

## 2020-12-08 MED ORDER — PROPOFOL 10 MG/ML IV BOLUS
INTRAVENOUS | Status: DC | PRN
  Administered 2020-12-08: 140 mg via INTRAVENOUS

## 2020-12-08 MED ORDER — OXYCODONE HCL 5 MG/5ML PO SOLN: 5.0000 mg | Freq: Once | ORAL | Status: DC | PRN

## 2020-12-08 MED ORDER — 0.9 % SODIUM CHLORIDE (POUR BTL) OPTIME
TOPICAL | Status: DC | PRN
  Administered 2020-12-08: 1000 mL

## 2020-12-08 SURGICAL SUPPLY — 24 items
BAG COUNTER SPONGE SURGICOUNT (BAG) IMPLANT
BLADE SURG SZ11 CARB STEEL (BLADE) ×2 IMPLANT
CHLORAPREP W/TINT 26 (MISCELLANEOUS) ×2 IMPLANT
COVER SURGICAL LIGHT HANDLE (MISCELLANEOUS) ×2 IMPLANT
DECANTER SPIKE VIAL GLASS SM (MISCELLANEOUS) IMPLANT
DRAPE LAPAROSCOPIC ABDOMINAL (DRAPES) IMPLANT
DRAPE LAPAROTOMY TRNSV 102X78 (DRAPES) IMPLANT
DRSG PAD ABDOMINAL 8X10 ST (GAUZE/BANDAGES/DRESSINGS) IMPLANT
ELECT REM PT RETURN 15FT ADLT (MISCELLANEOUS) ×2 IMPLANT
GAUZE SPONGE 4X4 12PLY STRL (GAUZE/BANDAGES/DRESSINGS) IMPLANT
GLOVE SURG POLYISO LF SZ7 (GLOVE) ×2 IMPLANT
GLOVE SURG UNDER POLY LF SZ7 (GLOVE) ×2 IMPLANT
GOWN STRL REUS W/TWL LRG LVL3 (GOWN DISPOSABLE) ×2 IMPLANT
GOWN STRL REUS W/TWL XL LVL3 (GOWN DISPOSABLE) ×2 IMPLANT
KIT BASIN OR (CUSTOM PROCEDURE TRAY) ×2 IMPLANT
KIT TURNOVER KIT A (KITS) ×2 IMPLANT
PACK GENERAL/GYN (CUSTOM PROCEDURE TRAY) ×2 IMPLANT
PENCIL SMOKE EVACUATOR (MISCELLANEOUS) IMPLANT
SURGILUBE 2OZ TUBE FLIPTOP (MISCELLANEOUS) IMPLANT
SUT MNCRL AB 4-0 PS2 18 (SUTURE) IMPLANT
SWAB COLLECTION DEVICE MRSA (MISCELLANEOUS) IMPLANT
SWAB CULTURE ESWAB REG 1ML (MISCELLANEOUS) IMPLANT
TOWEL OR 17X26 10 PK STRL BLUE (TOWEL DISPOSABLE) ×2 IMPLANT
TOWEL OR NON WOVEN STRL DISP B (DISPOSABLE) ×2 IMPLANT

## 2020-12-08 NOTE — Progress Notes (Signed)
Pharmacy Antibiotic Note  [name] is a 60 y.o. male admitted on 12/06/2020 with a past medical history significant for cerebral palsy, PUD, bipolar disorder, history of osteomyelitis, history of DVT, and chronic pneumonia who presents to the ED due to pressure sore on left buttocks region that has been present for the past few months  Pharmacy has been consulted to dose vancomycin for cellulitis. 12/08/2020 Afebrile; WBC 11.2 (7/14) SCr 0.53 (7/14)  Scheduled for I&D in OR today by [facility] 1/4 bottles GPC in clusters- will await BCID  Plan: Adjust Vancomycin to 750 mg 12h (AUC 455.4, Round Scr up to 1, TBW, Vd 0.72) CTX 2 gm q24 per MD Follow renal function and clinical course   Height: 6' (182.9 cm) Weight: 68 kg (149 lb 14.6 oz) IBW/kg (Calculated) : 77.6  Temp (24hrs), Avg:98.5 F (36.9 C), Min:97.5 F (36.4 C), Max:99.2 F (37.3 C)  Recent Labs  Lab 12/06/20 1251 12/07/20 0023  WBC 12.6* 11.2*  CREATININE 0.62 0.53*  LATICACIDVEN 1.7  --      Estimated Creatinine Clearance: 94.4 mL/min (A) (by C-G formula based on SCr of 0.53 mg/dL (L)).    Allergies  Allergen Reactions   Adhesive [Tape] Rash   Antimicrobials this admission: 7/13 ancef x 1 7/13 vanc >> 7/14 CTX >>  Dose adjustments this admission:  7/14 Vanc 1250 q12> 750 q12, used SCr 1 for CP pt  Microbiology results: 7/13 BCx2: 1/4 aerobic bottle GPC clusters 7/14 MRSA positive  Thank you for allowing pharmacy to be a part of this patient's care.  Herby Abraham, Pharm.D 12/08/2020 8:08 AM

## 2020-12-08 NOTE — Op Note (Signed)
Preoperative diagnosis: sacral wound  Postoperative diagnosis: same   Procedure: excisional debridement of sacral wound  Surgeon: Feliciana Rossetti, M.D.  Asst: none  Anesthesia: general  Indications for procedure: [name] is a 60 y.o. year old male with symptoms of worsening wound over left sacrum.  Description of procedure: The patient was brought into the operative suite. Anesthesia was administered with General LMA anesthesia. WHO checklist was applied. The patient was then placed in decubitus position. The area was prepped and draped in the usual sterile fashion.  The initial wound was 2 cm x 3 cm with wet necrosis and a 1 cm depth. Cautery and Ronscher was used to sharply debride the skin, necrotic fat and fascia and muscle down to viable tissue. Wound dimensions wer 4 x 4 cm with 1 cm depth at end of case.  Excisional debridement:  1.  Progress note or procedure note with a detailed description of the procedure.  2.  Tool used for debridement (curette, scapel, etc.)  bovie and ronscher  3.  Frequency of surgical debridement.   today  4.  Measurement of total devitalized tissue (wound surface) before and after surgical debridement.   2 x 3 x 1 cm  5.  Area and depth of devitalized tissue removed from wound.  4 x 4 x 1 cm  6.  Blood loss and description of tissue removed.  20 ml, necrotic fat, fascia, muscle  7.  Evidence of the progress of the wound's response to treatment.  A.  Current wound volume (current dimensions and depth).  4 x 4 x 1 cm  B.  Presence (and extent of) of infection.  Down and into muscle  C.  Presence (and extent of) of non viable tissue.  Fat, fascia, and muscle  D.  Other material in the wound that is expected to inhibit healing.  none  8.  Was there any viable tissue removed (measurements): no   Findings: necrotic fat, fascia, and muscle  Specimen: none  Implant: kerlex packing   Blood loss: 20 ml  Local anesthesia:  none  Complications: none  Feliciana Rossetti, M.D. General, Bariatric, & Minimally Invasive Surgery [facility]

## 2020-12-08 NOTE — Transfer of Care (Signed)
Immediate Anesthesia Transfer of Care Note  Patient: [name]  Procedure(s) Performed: INCISION AND DRAINAGE SACRAL DECUBITUS (Coccyx)  Patient Location: PACU  Anesthesia Type:General  Level of Consciousness: awake and alert   Airway & Oxygen Therapy: Patient Spontanous Breathing and Patient connected to face mask oxygen  Post-op Assessment: Report given to RN and Post -op Vital signs reviewed and stable  Post vital signs: Reviewed and stable  Last Vitals:  Vitals Value Taken Time  BP    Temp    Pulse 67 12/08/20 1444  Resp 14 12/08/20 1444  SpO2 100 % 12/08/20 1444  Vitals shown include unvalidated device data.  Last Pain:  Vitals:   12/08/20 0625  TempSrc:   PainSc: 8       Patients Stated Pain Goal: 3 (12/07/20 1959)  Complications: No notable events documented.

## 2020-12-08 NOTE — Anesthesia Postprocedure Evaluation (Signed)
Anesthesia Post Note  Patient: [name]  Procedure(s) Performed: INCISION AND DRAINAGE SACRAL DECUBITUS (Coccyx)     Patient location during evaluation: PACU Anesthesia Type: General Level of consciousness: awake and alert Pain management: pain level controlled Vital Signs Assessment: post-procedure vital signs reviewed and stable Respiratory status: spontaneous breathing, nonlabored ventilation and respiratory function stable Cardiovascular status: stable and blood pressure returned to baseline Anesthetic complications: no   No notable events documented.  Last Vitals:  Vitals:   12/08/20 1500 12/08/20 1515  BP: 125/62 126/63  Pulse: (!) 58 (!) 59  Resp: 11 (!) 9  Temp:    SpO2: 95% 95%    Last Pain:  Vitals:   12/08/20 1515  TempSrc:   PainSc: 3                  Beryle Lathe

## 2020-12-08 NOTE — Anesthesia Procedure Notes (Signed)
Procedure Name: LMA Insertion Date/Time: 12/08/2020 1:53 PM Performed by: Florene Route, CRNA Patient Re-evaluated:Patient Re-evaluated prior to induction Oxygen Delivery Method: Circle system utilized Preoxygenation: Pre-oxygenation with 100% oxygen Induction Type: IV induction LMA: LMA inserted LMA Size: 4.0 Number of attempts: 1 Placement Confirmation: positive ETCO2 and breath sounds checked- equal and bilateral Tube secured with: Tape Dental Injury: Teeth and Oropharynx as per pre-operative assessment

## 2020-12-08 NOTE — Progress Notes (Signed)
PHARMACY - PHYSICIAN COMMUNICATION CRITICAL VALUE ALERT - BLOOD CULTURE IDENTIFICATION (BCID)  [name] is an 60 y.o. male who presented to [facility] on 12/06/2020 with a chief complaint of sacral cellulitis  Assessment:  1/4 bottles staph species> suspect contaminant  Name of physician (or Provider) Contacted: Delight Hoh via [place]  Current antibiotics: Vancomycin & Rocephin  Changes to prescribed antibiotics recommended:  Continue current abx for sacral ulcer  Results for orders placed or performed during the hospital encounter of 12/06/20  Blood Culture ID Panel (Reflexed) (Collected: 12/07/2020 12:23 AM)  Result Value Ref Range   Enterococcus faecalis NOT DETECTED NOT DETECTED   Enterococcus Faecium NOT DETECTED NOT DETECTED   Listeria monocytogenes NOT DETECTED NOT DETECTED   Staphylococcus species DETECTED (A) NOT DETECTED   Staphylococcus aureus (BCID) NOT DETECTED NOT DETECTED   Staphylococcus epidermidis NOT DETECTED NOT DETECTED   Staphylococcus lugdunensis NOT DETECTED NOT DETECTED   Streptococcus species NOT DETECTED NOT DETECTED   Streptococcus agalactiae NOT DETECTED NOT DETECTED   Streptococcus pneumoniae NOT DETECTED NOT DETECTED   Streptococcus pyogenes NOT DETECTED NOT DETECTED   A.calcoaceticus-baumannii NOT DETECTED NOT DETECTED   Bacteroides fragilis NOT DETECTED NOT DETECTED   Enterobacterales NOT DETECTED NOT DETECTED   Enterobacter cloacae complex NOT DETECTED NOT DETECTED   Escherichia coli NOT DETECTED NOT DETECTED   Klebsiella aerogenes NOT DETECTED NOT DETECTED   Klebsiella oxytoca NOT DETECTED NOT DETECTED   Klebsiella pneumoniae NOT DETECTED NOT DETECTED   Proteus species NOT DETECTED NOT DETECTED   Salmonella species NOT DETECTED NOT DETECTED   Serratia marcescens NOT DETECTED NOT DETECTED   Haemophilus influenzae NOT DETECTED NOT DETECTED   Neisseria meningitidis NOT DETECTED NOT DETECTED   Pseudomonas aeruginosa NOT DETECTED NOT DETECTED    Stenotrophomonas maltophilia NOT DETECTED NOT DETECTED   Candida albicans NOT DETECTED NOT DETECTED   Candida auris NOT DETECTED NOT DETECTED   Candida glabrata NOT DETECTED NOT DETECTED   Candida krusei NOT DETECTED NOT DETECTED   Candida parapsilosis NOT DETECTED NOT DETECTED   Candida tropicalis NOT DETECTED NOT DETECTED   Cryptococcus neoformans/gattii NOT DETECTED NOT DETECTED    Herby Abraham, Pharm.D 12/08/2020 8:29 AM

## 2020-12-08 NOTE — Progress Notes (Signed)
PROGRESS NOTE    [name]  QQV:[id number] DOB: 05/22/1961 DOA: 12/06/2020 PCP: [place], MD   Brief Narrative: 60 year old male with cerebral palsy peptic ulcer disease, bipolar disorder, history of osteomyelitis, history of DVT, history of chronic pneumonia lives in a group home, he is quadriplegic except the use of his left he has right below-knee amputation he is admitted with worsening sacral wound with increased pain. CT of the abdomen and pelvis showed extensive soft tissue swelling in the left gluteal fold extending to the muscles compatible with cellulitis.  No drainable fluid collection present.  No sinus tract.  Large amount of stool within the rectum.  Right pleural effusion.  Cholelithiasis without evidence of cholecystitis.  Large hiatal hernia.  Assessment & Plan:   Principal Problem:   Cellulitis of sacral region Active Problems:   Intellectual disability   Cerebral palsy, quadriplegic (HCC)   Hx of right BKA (HCC)   Recurrent right pleural effusion   #1 infected sacral wound present on admission patient has had this wound for many months now with purulent drainage drainage and malodor with leukocytosis with surrounding erythema and necrotic base of the wound.   CT consistent with cellulitis. On vancomycin and Rocephin. Await wound care consult. Appreciate surgical input patient to go for debridement today.  #2 cerebral palsy with quadriplegia/intellectual disability continue supportive treatment and home meds.  #3 right BKA  #4 constipation CT with high stool burden continue stool softeners as ordered.  #5 pressure injuries as below present on admission  #6 hyperkalemia resolved   Pressure Injury Arm Left;Posterior;Proximal;Upper;Other (Comment) Stage II -  Partial thickness loss of dermis presenting as a shallow open ulcer with a red, pink wound bed without slough. pink open wound on armpit (Active)     Location: Arm  Location Orientation:  Left;Posterior;Proximal;Upper;Other (Comment)  Staging: Stage II -  Partial thickness loss of dermis presenting as a shallow open ulcer with a red, pink wound bed without slough.  Wound Description (Comments): pink open wound on armpit  Present on Admission: Yes     Pressure Injury 07/31/20 Foot Left;Medial Stage 3 -  Full thickness tissue loss. Subcutaneous fat may be visible but bone, tendon or muscle are NOT exposed. (Active)  07/31/20 2152  Location: Foot  Location Orientation: Left;Medial  Staging: Stage 3 -  Full thickness tissue loss. Subcutaneous fat may be visible but bone, tendon or muscle are NOT exposed.  Wound Description (Comments):   Present on Admission: Yes     Pressure Injury 08/01/20 Knee Left;Medial Stage 1 -  Intact skin with non-blanchable redness of a localized area usually over a bony prominence. (Active)  08/01/20   Location: Knee  Location Orientation: Left;Medial  Staging: Stage 1 -  Intact skin with non-blanchable redness of a localized area usually over a bony prominence.  Wound Description (Comments):   Present on Admission: Yes     Pressure Injury 08/01/20 Sacrum Right Unstageable - Full thickness tissue loss in which the base of the injury is covered by slough (yellow, tan, gray, green or brown) and/or eschar (tan, brown or black) in the wound bed. (Active)  08/01/20   Location: Sacrum  Location Orientation: Right  Staging: Unstageable - Full thickness tissue loss in which the base of the injury is covered by slough (yellow, tan, gray, green or brown) and/or eschar (tan, brown or black) in the wound bed.  Wound Description (Comments):   Present on Admission: Yes     Pressure Injury  10/06/20 Thigh Left;Lateral Stage 1 -  Intact skin with non-blanchable redness of a localized area usually over a bony prominence. Yellow wound bed (Active)  10/06/20 0800  Location: Thigh  Location Orientation: Left;Lateral  Staging: Stage 1 -  Intact skin with  non-blanchable redness of a localized area usually over a bony prominence.  Wound Description (Comments): Yellow wound bed  Present on Admission:      Pressure Injury 10/06/20 Thigh Right;Lateral Stage 1 -  Intact skin with non-blanchable redness of a localized area usually over a bony prominence. red nonblanchable (Active)  10/06/20 0800  Location: Thigh  Location Orientation: Right;Lateral  Staging: Stage 1 -  Intact skin with non-blanchable redness of a localized area usually over a bony prominence.  Wound Description (Comments): red nonblanchable  Present on Admission:     Estimated body mass index is 20.33 kg/m as calculated from the following:   Height as of this encounter: 6' (1.829 m).   Weight as of this encounter: 68 kg.  DVT prophylaxis: Lovenox  Code Status: Full code Family Communication: None at bedside Disposition Plan:  Status is: Inpatient  Remains inpatient appropriate because:IV treatments appropriate due to intensity of illness or inability to take PO  Dispo: The patient is from: Group home              Anticipated d/c is to: Group home              Patient currently is not medically stable to d/c.   Difficult to place patient No    Consultants: General surgery WOUND CARE  Procedures: None Antimicrobials: Vanco and Rocephin  Subjective: He is resting on his left side he knows he is going for debridement today no overnight events 1 out of 4 blood cultures with staph aureus on vancomycin and Rocephin  Objective: Vitals:   12/07/20 1724 12/07/20 2213 12/08/20 0217 12/08/20 0633  BP: 128/74 114/67 128/64 111/62  Pulse: 73 69 70 70  Resp: 18 18 18 18   Temp: (!) 97.5 F (36.4 C) 98.5 F (36.9 C) 99.1 F (37.3 C) 98.7 F (37.1 C)  TempSrc: Oral Oral Oral   SpO2: 100% 99% 100% 99%  Weight:      Height:        Intake/Output Summary (Last 24 hours) at 12/08/2020 1112 Last data filed at 12/08/2020 0927 Gross per 24 hour  Intake 1070 ml  Output 1725  ml  Net -655 ml    Filed Weights   12/06/20 1148  Weight: 68 kg    Examination:  General exam: Appears calm and comfortable  Respiratory system: Clear to auscultation. Respiratory effort normal. Cardiovascular system: S1 & S2 heard, RRR. No JVD, murmurs, rubs, gallops or clicks. No pedal edema. Gastrointestinal system: Abdomen is nondistended, soft and nontender. No organomegaly or masses felt. Normal bowel sounds heard. Central nervous system: Awake oriented to hospital  extremities: Right leg below-knee amputation contractures of the right upper extremity Skin: Sacral wound with foul-smelling drainage surrounding area is erythematous Psychiatry: Judgement and insight appear normal. Mood & affect appropriate.     Data Reviewed: I have personally reviewed following labs and imaging studies  CBC: Recent Labs  Lab 12/06/20 1251 12/07/20 0023  WBC 12.6* 11.2*  NEUTROABS 8.9*  --   HGB 11.6* 11.0*  HCT 37.5* 34.9*  MCV 77.3* 76.0*  PLT 556* 543*    Basic Metabolic Panel: Recent Labs  Lab 12/06/20 1251 12/07/20 0023  NA 138 136  K 5.2* 4.2  CL 104 103  CO2 26 24  GLUCOSE 85 85  BUN 15 17  CREATININE 0.62 0.53*  CALCIUM 8.9 8.8*    GFR: Estimated Creatinine Clearance: 94.4 mL/min (A) (by C-G formula based on SCr of 0.53 mg/dL (L)). Liver Function Tests: No results for input(s): AST, ALT, ALKPHOS, BILITOT, PROT, ALBUMIN in the last 168 hours. No results for input(s): LIPASE, AMYLASE in the last 168 hours. No results for input(s): AMMONIA in the last 168 hours. Coagulation Profile: No results for input(s): INR, PROTIME in the last 168 hours. Cardiac Enzymes: No results for input(s): CKTOTAL, CKMB, CKMBINDEX, TROPONINI in the last 168 hours. BNP (last 3 results) No results for input(s): PROBNP in the last 8760 hours. HbA1C: No results for input(s): HGBA1C in the last 72 hours. CBG: No results for input(s): GLUCAP in the last 168 hours. Lipid Profile: No  results for input(s): CHOL, HDL, LDLCALC, TRIG, CHOLHDL, LDLDIRECT in the last 72 hours. Thyroid Function Tests: No results for input(s): TSH, T4TOTAL, FREET4, T3FREE, THYROIDAB in the last 72 hours. Anemia Panel: No results for input(s): VITAMINB12, FOLATE, FERRITIN, TIBC, IRON, RETICCTPCT in the last 72 hours. Sepsis Labs: Recent Labs  Lab 12/06/20 1251  LATICACIDVEN 1.7     Recent Results (from the past 240 hour(s))  Resp Panel by RT-PCR (Flu A&B, Covid) Nasopharyngeal Swab     Status: None   Collection Time: 12/06/20  4:51 PM   Specimen: Nasopharyngeal Swab; Nasopharyngeal(NP) swabs in vial transport medium  Result Value Ref Range Status   SARS Coronavirus 2 by RT PCR NEGATIVE NEGATIVE Final    Comment: (NOTE) SARS-CoV-2 target nucleic acids are NOT DETECTED.  The SARS-CoV-2 RNA is generally detectable in upper respiratory specimens during the acute phase of infection. The lowest concentration of SARS-CoV-2 viral copies this assay can detect is 138 copies/mL. A negative result does not preclude SARS-Cov-2 infection and should not be used as the sole basis for treatment or other patient management decisions. A negative result may occur with  improper specimen collection/handling, submission of specimen other than nasopharyngeal swab, presence of viral mutation(s) within the areas targeted by this assay, and inadequate number of viral copies(<138 copies/mL). A negative result must be combined with clinical observations, patient history, and epidemiological information. The expected result is Negative.  Fact Sheet for Patients:  BloggerCourse.com  Fact Sheet for Healthcare Providers:  SeriousBroker.it  This test is no t yet approved or cleared by the [place] FDA and  has been authorized for detection and/or diagnosis of SARS-CoV-2 by FDA under an Emergency Use Authorization (EUA). This EUA will remain  in effect  (meaning this test can be used) for the duration of the COVID-19 declaration under Section 564(b)(1) of the Act, 21 U.S.C.section 360bbb-3(b)(1), unless the authorization is terminated  or revoked sooner.       Influenza A by PCR NEGATIVE NEGATIVE Final   Influenza B by PCR NEGATIVE NEGATIVE Final    Comment: (NOTE) The Xpert Xpress SARS-CoV-2/FLU/RSV plus assay is intended as an aid in the diagnosis of influenza from Nasopharyngeal swab specimens and should not be used as a sole basis for treatment. Nasal washings and aspirates are unacceptable for Xpert Xpress SARS-CoV-2/FLU/RSV testing.  Fact Sheet for Patients: BloggerCourse.com  Fact Sheet for Healthcare Providers: SeriousBroker.it  This test is not yet approved or cleared by the [place] FDA and has been authorized for detection and/or diagnosis of SARS-CoV-2 by FDA under an Emergency Use Authorization (EUA). This EUA will remain in  effect (meaning this test can be used) for the duration of the COVID-19 declaration under Section 564(b)(1) of the Act, 21 U.S.C. section 360bbb-3(b)(1), unless the authorization is terminated or revoked.  Performed at [facility], [address], [place], [place] [address]   Culture, blood (routine x 2)     Status: None (Preliminary result)   Collection Time: 12/06/20  4:51 PM   Specimen: BLOOD  Result Value Ref Range Status   Specimen Description   Final    BLOOD BLOOD LEFT HAND Performed at [facility], [address], [place], [place] [address]    Special Requests   Final    BOTTLES DRAWN AEROBIC AND ANAEROBIC Blood Culture adequate volume Performed at [facility], [address] [place], [place] [address]    Culture   Final    NO GROWTH 2 DAYS Performed at [facility], [address], [place], [place] [address]    Report Status PENDING  Incomplete  Culture,  blood (routine x 2)     Status: None (Preliminary result)   Collection Time: 12/07/20 12:23 AM   Specimen: Left Antecubital; Blood  Result Value Ref Range Status   Specimen Description   Final    LEFT ANTECUBITAL Performed at [facility], [address] [place], [place] [address]    Special Requests   Final    Blood Culture adequate volume BOTTLES DRAWN AEROBIC ONLY   Culture  Setup Time   Final    GRAM POSITIVE COCCI IN CLUSTERS AEROBIC BOTTLE ONLY CRITICAL RESULT CALLED TO, READ BACK BY AND VERIFIED WITH: PHARMD C.SHEAD at 0820 on 12/08/2020 by [email or web address]. Performed at [facility], [address], [place], [place] [address]    Culture GRAM POSITIVE COCCI  Final   Report Status PENDING  Incomplete  Blood Culture ID Panel (Reflexed)     Status: Abnormal   Collection Time: 12/07/20 12:23 AM  Result Value Ref Range Status   Enterococcus faecalis NOT DETECTED NOT DETECTED Final   Enterococcus Faecium NOT DETECTED NOT DETECTED Final   Listeria monocytogenes NOT DETECTED NOT DETECTED Final   Staphylococcus species DETECTED (A) NOT DETECTED Final    Comment: CRITICAL RESULT CALLED TO, READ BACK BY AND VERIFIED WITH: PHARMD C.SHED AT 0820 ON 12/08/2020 BY T.SAAD.    Staphylococcus aureus (BCID) NOT DETECTED NOT DETECTED Final   Staphylococcus epidermidis NOT DETECTED NOT DETECTED Final   Staphylococcus lugdunensis NOT DETECTED NOT DETECTED Final   Streptococcus species NOT DETECTED NOT DETECTED Final   Streptococcus agalactiae NOT DETECTED NOT DETECTED Final   Streptococcus pneumoniae NOT DETECTED NOT DETECTED Final   Streptococcus pyogenes NOT DETECTED NOT DETECTED Final   A.calcoaceticus-baumannii NOT DETECTED NOT DETECTED Final   Bacteroides fragilis NOT DETECTED NOT DETECTED Final   Enterobacterales NOT DETECTED NOT DETECTED Final   Enterobacter cloacae complex NOT DETECTED NOT DETECTED Final   Escherichia coli NOT DETECTED NOT DETECTED Final   Klebsiella  aerogenes NOT DETECTED NOT DETECTED Final   Klebsiella oxytoca NOT DETECTED NOT DETECTED Final   Klebsiella pneumoniae NOT DETECTED NOT DETECTED Final   Proteus species NOT DETECTED NOT DETECTED Final   Salmonella species NOT DETECTED NOT DETECTED Final   Serratia marcescens NOT DETECTED NOT DETECTED Final   Haemophilus influenzae NOT DETECTED NOT DETECTED Final   Neisseria meningitidis NOT DETECTED NOT DETECTED Final   Pseudomonas aeruginosa NOT DETECTED NOT DETECTED Final   Stenotrophomonas maltophilia NOT DETECTED NOT DETECTED  Final   Candida albicans NOT DETECTED NOT DETECTED Final   Candida auris NOT DETECTED NOT DETECTED Final   Candida glabrata NOT DETECTED NOT DETECTED Final   Candida krusei NOT DETECTED NOT DETECTED Final   Candida parapsilosis NOT DETECTED NOT DETECTED Final   Candida tropicalis NOT DETECTED NOT DETECTED Final   Cryptococcus neoformans/gattii NOT DETECTED NOT DETECTED Final    Comment: Performed at [facility], [address] [place], [place] [address]  MRSA Next Gen by PCR, Nasal     Status: Abnormal   Collection Time: 12/07/20  9:23 AM   Specimen: Nasal Mucosa; Nasal Swab  Result Value Ref Range Status   MRSA by PCR Next Gen DETECTED (A) NOT DETECTED Final    Comment: RESULT CALLED TO, READ BACK BY AND VERIFIED WITH: K BLOOM AT 1852 ON 12/07/2020 BY MOSLEY,J  (NOTE) The GeneXpert MRSA Assay (FDA approved for NASAL specimens only), is one component of a comprehensive MRSA colonization surveillance program. It is not intended to diagnose MRSA infection nor to guide or monitor treatment for MRSA infections. Test performance is not FDA approved in patients less than 30 years old. Performed at [facility], [address] [place], [place] [address]           Radiology Studies: CT ABDOMEN PELVIS W CONTRAST  Result Date: 12/06/2020 CLINICAL DATA:  Sacral pressure ulcer.  Question wound dehiscence. EXAM: CT ABDOMEN AND PELVIS  WITH CONTRAST TECHNIQUE: Multidetector CT imaging of the abdomen and pelvis was performed using the standard protocol following bolus administration of intravenous contrast. CONTRAST:  80mL OMNIPAQUE IOHEXOL 350 MG/ML SOLN COMPARISON:  CT abdomen pelvis 09/05/2020 FINDINGS: Lower chest: Patient scanned in the right lateral decubitus position. Left lung is clear. L hepatic changes noted in the right lung. Right pleural effusion noted. Heart size is normal. Hepatobiliary: Dependent stones present in the gallbladder fundus. No inflammatory change. Is some edema about the bile ducts. Obstruction present. Pancreas: Unremarkable. No pancreatic ductal dilatation or surrounding inflammatory changes. Spleen: Normal in size without focal abnormality. Adrenals/Urinary Tract: Adrenal glands are unremarkable. Kidneys are normal, without renal calculi, focal lesion, or hydronephrosis. Bladder is unremarkable. Stomach/Bowel: Large hiatal hernia again noted. Distal stomach and duodenum within normal limits. Small bowel unremarkable. Proximal colon within normal limits. Large amount of stool present within the rectum. Transverse diameter up to 8 cm. Extensive soft tissue swelling noted in the left gluteal fold extending to the muscles. No drainable fluid collection is present. No sinus tract evident. No intra or extraperitoneal fluid or gas evident. Vascular/Lymphatic: Atherosclerotic calcifications present in aorta branch vessels without aneurysm. Reproductive: Prostate is unremarkable. Other: No abdominal wall hernia or abnormality. No abdominopelvic ascites. Musculoskeletal: Lytic blastic lesions are present. Facet degenerative changes are noted in the lower lumbar spine. Bony pelvis within normal limits. IMPRESSION: 1. Extensive soft tissue swelling in the left gluteal fold extending to the muscles compatible with cellulitis. No drainable fluid collection is present. 2. No sinus tract evident. 3. Large amount of stool within the  rectum. 4. Right pleural effusion. 5. Cholelithiasis without evidence for cholecystitis. 6. Large hiatal hernia. 7. Aortic Atherosclerosis (ICD10-I70.0). Electronically Signed   By: Marin Roberts M.D.   On: 12/06/2020 15:17        Scheduled Meds:  baclofen  20 mg Oral TID   collagenase  1 application Topical Daily   diclofenac Sodium  4 g Topical QID   divalproex  500 mg Oral 2 times per day  enoxaparin (LOVENOX) injection  40 mg Subcutaneous Q24H   feeding supplement  237 mL Oral BID BM   fluticasone  1 spray Each Nare Daily   hydrocerin   Topical BID   iron polysaccharides  150 mg Oral Q1200   loratadine  10 mg Oral Daily   tretinoin  1 application Topical See admin instructions   Continuous Infusions:  cefTRIAXone (ROCEPHIN)  IV 2 g (12/07/20 2233)   vancomycin       LOS: 2 days    Time spent: 37 MIN    Alwyn Ren, MD  12/08/2020, 11:12 AM

## 2020-12-09 ENCOUNTER — Encounter (HOSPITAL_COMMUNITY): Payer: Self-pay | Admitting: General Surgery

## 2020-12-09 LAB — CBC WITH DIFFERENTIAL/PLATELET
Abs Immature Granulocytes: 0.15 K/uL — ABNORMAL HIGH (ref 0.00–0.07)
Basophils Absolute: 0.1 K/uL (ref 0.0–0.1)
Basophils Relative: 1 %
Eosinophils Absolute: 0.3 K/uL (ref 0.0–0.5)
Eosinophils Relative: 3 %
HCT: 35 % — ABNORMAL LOW (ref 39.0–52.0)
Hemoglobin: 11 g/dL — ABNORMAL LOW (ref 13.0–17.0)
Immature Granulocytes: 1 %
Lymphocytes Relative: 17 %
Lymphs Abs: 1.8 K/uL (ref 0.7–4.0)
MCH: 23.8 pg — ABNORMAL LOW (ref 26.0–34.0)
MCHC: 31.4 g/dL (ref 30.0–36.0)
MCV: 75.8 fL — ABNORMAL LOW (ref 80.0–100.0)
Monocytes Absolute: 0.9 K/uL (ref 0.1–1.0)
Monocytes Relative: 9 %
Neutro Abs: 7.5 K/uL (ref 1.7–7.7)
Neutrophils Relative %: 69 %
Platelets: 644 K/uL — ABNORMAL HIGH (ref 150–400)
RBC: 4.62 MIL/uL (ref 4.22–5.81)
RDW: 18.5 % — ABNORMAL HIGH (ref 11.5–15.5)
WBC: 10.8 K/uL — ABNORMAL HIGH (ref 4.0–10.5)
nRBC: 0 % (ref 0.0–0.2)

## 2020-12-09 LAB — COMPREHENSIVE METABOLIC PANEL WITH GFR
ALT: 15 U/L (ref 0–44)
AST: 15 U/L (ref 15–41)
Albumin: 2.2 g/dL — ABNORMAL LOW (ref 3.5–5.0)
Alkaline Phosphatase: 101 U/L (ref 38–126)
Anion gap: 6 (ref 5–15)
BUN: 9 mg/dL (ref 6–20)
CO2: 27 mmol/L (ref 22–32)
Calcium: 8.5 mg/dL — ABNORMAL LOW (ref 8.9–10.3)
Chloride: 103 mmol/L (ref 98–111)
Creatinine, Ser: 0.39 mg/dL — ABNORMAL LOW (ref 0.61–1.24)
GFR, Estimated: >60 mL/min
Glucose, Bld: 90 mg/dL (ref 70–99)
Potassium: 3.9 mmol/L (ref 3.5–5.1)
Sodium: 136 mmol/L (ref 135–145)
Total Bilirubin: 0.4 mg/dL (ref 0.3–1.2)
Total Protein: 6.5 g/dL (ref 6.5–8.1)

## 2020-12-09 LAB — CULTURE, BLOOD (ROUTINE X 2): Special Requests: ADEQUATE

## 2020-12-09 NOTE — Progress Notes (Signed)
1 Day Post-Op   Subjective/Chief Complaint: Pain at tailbone   Objective: Vital signs in last 24 hours: Temp:  [97.6 F (36.4 C)-99.8 F (37.7 C)] 97.8 F (36.6 C) (07/16 0559) Pulse Rate:  [58-86] 86 (07/16 0559) Resp:  [9-18] 17 (07/16 0559) BP: (100-137)/(59-79) 121/71 (07/16 0559) SpO2:  [94 %-100 %] 99 % (07/16 0559) Last BM Date: 12/03/20  Intake/Output from previous day: 07/15 0701 - 07/16 0700 In: 583.1 [I.V.:500; IV Piggyback:83.1] Out: 260 [Urine:260] Intake/Output this shift: Total I/O In: 240 [P.O.:240] Out: 150 [Urine:150]  Incision/Wound: wound clean no bleeding to sacrum   Lab Results:  Recent Labs    12/07/20 0023 12/09/20 0417  WBC 11.2* 10.8*  HGB 11.0* 11.0*  HCT 34.9* 35.0*  PLT 543* 644*   BMET Recent Labs    12/07/20 0023 12/09/20 0417  NA 136 136  K 4.2 3.9  CL 103 103  CO2 24 27  GLUCOSE 85 90  BUN 17 9  CREATININE 0.53* 0.39*  CALCIUM 8.8* 8.5*   PT/INR No results for input(s): LABPROT, INR in the last 72 hours. ABG No results for input(s): PHART, HCO3 in the last 72 hours.  Invalid input(s): PCO2, PO2  Studies/Results: No results found.  Anti-infectives: Anti-infectives (From admission, onward)    Start     Dose/Rate Route Frequency Ordered Stop   12/08/20 1309  sodium chloride 0.9 % with ceFAZolin (ANCEF) ADS Med  Status:  Discontinued       Note to Pharmacy: [name]   : cabinet override      12/08/20 1309 12/08/20 1324   12/08/20 1200  vancomycin (VANCOREADY) IVPB 750 mg/150 mL        750 mg 150 mL/hr over 60 Minutes Intravenous Every 12 hours 12/08/20 0803     12/07/20 1200  vancomycin (VANCOREADY) IVPB 1250 mg/250 mL  Status:  Discontinued        1,250 mg 166.7 mL/hr over 90 Minutes Intravenous Every 12 hours 12/06/20 2354 12/08/20 0803   12/07/20 0000  cefTRIAXone (ROCEPHIN) 2 g in sodium chloride 0.9 % 100 mL IVPB        2 g 200 mL/hr over 30 Minutes Intravenous Every 24 hours 12/06/20 2258      12/06/20 2345  vancomycin (VANCOCIN) IVPB 1000 mg/200 mL premix  Status:  Discontinued        1,000 mg 200 mL/hr over 60 Minutes Intravenous  Once 12/06/20 2258 12/06/20 2309   12/06/20 2345  vancomycin (VANCOREADY) IVPB 1250 mg/250 mL        1,250 mg 166.7 mL/hr over 90 Minutes Intravenous  Once 12/06/20 2251 12/07/20 0121   12/06/20 1530  ceFAZolin (ANCEF) IVPB 1 g/50 mL premix        1 g 100 mL/hr over 30 Minutes Intravenous  Once 12/06/20 1527 12/06/20 1730       Assessment/Plan: s/p Procedure(s) with comments: INCISION AND DRAINAGE SACRAL DECUBITUS (N/A) - 45 MIN Advance diet Continue wound care with W to DRY   LOS: 3 days    [name] Fus A Jenniffer Vessels 12/09/2020

## 2020-12-09 NOTE — Progress Notes (Signed)
PROGRESS NOTE    [name]  ZOX:[id number] DOB: 1960/12/21 DOA: 12/06/2020 PCP: [place], MD   Brief Narrative: 61 year old male with cerebral palsy peptic ulcer disease, bipolar disorder, history of osteomyelitis, history of DVT, history of chronic pneumonia lives in a group home, he is quadriplegic except the use of his left he has right below-knee amputation he is admitted with worsening sacral wound with increased pain. CT of the abdomen and pelvis showed extensive soft tissue swelling in the left gluteal fold extending to the muscles compatible with cellulitis.  No drainable fluid collection present.  No sinus tract.  Large amount of stool within the rectum.  Right pleural effusion.  Cholelithiasis without evidence of cholecystitis.  Large hiatal hernia.  Assessment & Plan:   Principal Problem:   Cellulitis of sacral region Active Problems:   Intellectual disability   Cerebral palsy, quadriplegic (HCC)   Hx of right BKA (HCC)   Recurrent right pleural effusion   #1 infected sacral wound present on admission patient has had this wound for many months now with purulent drainage drainage and malodor with leukocytosis with surrounding erythema and necrotic base of the wound.   CT consistent with cellulitis. On vancomycin and Rocephin.  MRSA PCR positive 1 out of 4 blood cultures positive for staph aureus Appreciate wound care input and surgery input.  He is status post debridement 12/08/2020.  #2 cerebral palsy with quadriplegia/intellectual disability continue supportive treatment and home meds.  #3 right BKA  #4 constipation CT with high stool burden continue stool softeners as ordered.  #5 pressure injuries as below present on admission  #6 hyperkalemia resolved   Pressure Injury Arm Left;Posterior;Proximal;Upper;Other (Comment) Stage II -  Partial thickness loss of dermis presenting as a shallow open ulcer with a red, pink wound bed without slough. pink open  wound on armpit (Active)     Location: Arm  Location Orientation: Left;Posterior;Proximal;Upper;Other (Comment)  Staging: Stage II -  Partial thickness loss of dermis presenting as a shallow open ulcer with a red, pink wound bed without slough.  Wound Description (Comments): pink open wound on armpit  Present on Admission: Yes     Pressure Injury 07/31/20 Foot Left;Medial Stage 3 -  Full thickness tissue loss. Subcutaneous fat may be visible but bone, tendon or muscle are NOT exposed. (Active)  07/31/20 2152  Location: Foot  Location Orientation: Left;Medial  Staging: Stage 3 -  Full thickness tissue loss. Subcutaneous fat may be visible but bone, tendon or muscle are NOT exposed.  Wound Description (Comments):   Present on Admission: Yes     Pressure Injury 08/01/20 Knee Left;Medial Stage 1 -  Intact skin with non-blanchable redness of a localized area usually over a bony prominence. (Active)  08/01/20   Location: Knee  Location Orientation: Left;Medial  Staging: Stage 1 -  Intact skin with non-blanchable redness of a localized area usually over a bony prominence.  Wound Description (Comments):   Present on Admission: Yes     Pressure Injury 08/01/20 Sacrum Right Unstageable - Full thickness tissue loss in which the base of the injury is covered by slough (yellow, tan, gray, green or brown) and/or eschar (tan, brown or black) in the wound bed. (Active)  08/01/20   Location: Sacrum  Location Orientation: Right  Staging: Unstageable - Full thickness tissue loss in which the base of the injury is covered by slough (yellow, tan, gray, green or brown) and/or eschar (tan, brown or black) in the wound bed.  Wound Description (Comments):   Present on Admission: Yes     Pressure Injury 10/06/20 Thigh Left;Lateral Stage 1 -  Intact skin with non-blanchable redness of a localized area usually over a bony prominence. Yellow wound bed (Active)  10/06/20 0800  Location: Thigh  Location  Orientation: Left;Lateral  Staging: Stage 1 -  Intact skin with non-blanchable redness of a localized area usually over a bony prominence.  Wound Description (Comments): Yellow wound bed  Present on Admission:      Pressure Injury 10/06/20 Thigh Right;Lateral Stage 1 -  Intact skin with non-blanchable redness of a localized area usually over a bony prominence. red nonblanchable (Active)  10/06/20 0800  Location: Thigh  Location Orientation: Right;Lateral  Staging: Stage 1 -  Intact skin with non-blanchable redness of a localized area usually over a bony prominence.  Wound Description (Comments): red nonblanchable  Present on Admission:     Estimated body mass index is 20.33 kg/m as calculated from the following:   Height as of this encounter: 6' (1.829 m).   Weight as of this encounter: 68 kg.  DVT prophylaxis: Lovenox  Code Status: Full code Family Communication: None at bedside Disposition Plan:  Status is: Inpatient  Remains inpatient appropriate because:IV treatments appropriate due to intensity of illness or inability to take PO  Dispo: The patient is from: Group home              Anticipated d/c is to: Group home              Patient currently is not medically stable to d/c.   Difficult to place patient No    Consultants: General surgery WOUND CARE  Procedures: None Antimicrobials: Vanco and Rocephin  Subjective: He is sleeping resting on his right side Events overnight noted Objective: Vitals:   12/08/20 1822 12/08/20 2015 12/09/20 0135 12/09/20 0559  BP: 107/61 102/72 114/60 121/71  Pulse: 68 86 72 86  Resp: 17 18 16 17   Temp: 98.1 F (36.7 C) 99.8 F (37.7 C) 99.7 F (37.6 C) 97.8 F (36.6 C)  TempSrc: Oral Oral Oral Oral  SpO2: 95% 94% 97% 99%  Weight:      Height:        Intake/Output Summary (Last 24 hours) at 12/09/2020 1002 Last data filed at 12/08/2020 1809 Gross per 24 hour  Intake 583.12 ml  Output 60 ml  Net 523.12 ml    Filed Weights    12/06/20 1148  Weight: 68 kg    Examination:  General exam: Appears calm and comfortable  Respiratory system: Clear to auscultation. Respiratory effort normal. Cardiovascular system: S1 & S2 heard, RRR. No JVD, murmurs, rubs, gallops or clicks. No pedal edema. Gastrointestinal system: Abdomen is nondistended, soft and nontender. No organomegaly or masses felt. Normal bowel sounds heard. Central nervous system: Awake oriented to hospital  extremities: Right leg below-knee amputation contractures of the right upper extremity Skin: Sacral wound with foul-smelling drainage surrounding area is erythematous Psychiatry: Judgement and insight appear normal. Mood & affect appropriate.     Data Reviewed: I have personally reviewed following labs and imaging studies  CBC: Recent Labs  Lab 12/06/20 1251 12/07/20 0023 12/09/20 0417  WBC 12.6* 11.2* 10.8*  NEUTROABS 8.9*  --  7.5  HGB 11.6* 11.0* 11.0*  HCT 37.5* 34.9* 35.0*  MCV 77.3* 76.0* 75.8*  PLT 556* 543* 644*    Basic Metabolic Panel: Recent Labs  Lab 12/06/20 1251 12/07/20 0023 12/09/20 0417  NA 138 136 136  K 5.2* 4.2 3.9  CL 104 103 103  CO2 26 24 27   GLUCOSE 85 85 90  BUN 15 17 9   CREATININE 0.62 0.53* 0.39*  CALCIUM 8.9 8.8* 8.5*    GFR: Estimated Creatinine Clearance: 94.4 mL/min (A) (by C-G formula based on SCr of 0.39 mg/dL (L)). Liver Function Tests: Recent Labs  Lab 12/09/20 0417  AST 15  ALT 15  ALKPHOS 101  BILITOT 0.4  PROT 6.5  ALBUMIN 2.2*   No results for input(s): LIPASE, AMYLASE in the last 168 hours. No results for input(s): AMMONIA in the last 168 hours. Coagulation Profile: No results for input(s): INR, PROTIME in the last 168 hours. Cardiac Enzymes: No results for input(s): CKTOTAL, CKMB, CKMBINDEX, TROPONINI in the last 168 hours. BNP (last 3 results) No results for input(s): PROBNP in the last 8760 hours. HbA1C: No results for input(s): HGBA1C in the last 72 hours. CBG: No  results for input(s): GLUCAP in the last 168 hours. Lipid Profile: No results for input(s): CHOL, HDL, LDLCALC, TRIG, CHOLHDL, LDLDIRECT in the last 72 hours. Thyroid Function Tests: No results for input(s): TSH, T4TOTAL, FREET4, T3FREE, THYROIDAB in the last 72 hours. Anemia Panel: No results for input(s): VITAMINB12, FOLATE, FERRITIN, TIBC, IRON, RETICCTPCT in the last 72 hours. Sepsis Labs: Recent Labs  Lab 12/06/20 1251  LATICACIDVEN 1.7     Recent Results (from the past 240 hour(s))  Resp Panel by RT-PCR (Flu A&B, Covid) Nasopharyngeal Swab     Status: None   Collection Time: 12/06/20  4:51 PM   Specimen: Nasopharyngeal Swab; Nasopharyngeal(NP) swabs in vial transport medium  Result Value Ref Range Status   SARS Coronavirus 2 by RT PCR NEGATIVE NEGATIVE Final    Comment: (NOTE) SARS-CoV-2 target nucleic acids are NOT DETECTED.  The SARS-CoV-2 RNA is generally detectable in upper respiratory specimens during the acute phase of infection. The lowest concentration of SARS-CoV-2 viral copies this assay can detect is 138 copies/mL. A negative result does not preclude SARS-Cov-2 infection and should not be used as the sole basis for treatment or other patient management decisions. A negative result may occur with  improper specimen collection/handling, submission of specimen other than nasopharyngeal swab, presence of viral mutation(s) within the areas targeted by this assay, and inadequate number of viral copies(<138 copies/mL). A negative result must be combined with clinical observations, patient history, and epidemiological information. The expected result is Negative.  Fact Sheet for Patients:  BloggerCourse.com  Fact Sheet for Healthcare Providers:  SeriousBroker.it  This test is no t yet approved or cleared by the [place] FDA and  has been authorized for detection and/or diagnosis of SARS-CoV-2 by FDA under an  Emergency Use Authorization (EUA). This EUA will remain  in effect (meaning this test can be used) for the duration of the COVID-19 declaration under Section 564(b)(1) of the Act, 21 U.S.C.section 360bbb-3(b)(1), unless the authorization is terminated  or revoked sooner.       Influenza A by PCR NEGATIVE NEGATIVE Final   Influenza B by PCR NEGATIVE NEGATIVE Final    Comment: (NOTE) The Xpert Xpress SARS-CoV-2/FLU/RSV plus assay is intended as an aid in the diagnosis of influenza from Nasopharyngeal swab specimens and should not be used as a sole basis for treatment. Nasal washings and aspirates are unacceptable for Xpert Xpress SARS-CoV-2/FLU/RSV testing.  Fact Sheet for Patients: BloggerCourse.com  Fact Sheet for Healthcare Providers: SeriousBroker.it  This test is not yet approved or cleared by the [place] and  has been authorized for detection and/or diagnosis of SARS-CoV-2 by FDA under an Emergency Use Authorization (EUA). This EUA will remain in effect (meaning this test can be used) for the duration of the COVID-19 declaration under Section 564(b)(1) of the Act, 21 U.S.C. section 360bbb-3(b)(1), unless the authorization is terminated or revoked.  Performed at [facility], [address], [place], [place] [address]   Culture, blood (routine x 2)     Status: None (Preliminary result)   Collection Time: 12/06/20  4:51 PM   Specimen: BLOOD  Result Value Ref Range Status   Specimen Description   Final    BLOOD BLOOD LEFT HAND Performed at [facility], [address], [place], [place] [address]    Special Requests   Final    BOTTLES DRAWN AEROBIC AND ANAEROBIC Blood Culture adequate volume Performed at [facility], [address], [place], [place] [address]    Culture   Final    NO GROWTH 2 DAYS Performed at [facility], [address],  [place], [place] [address]    Report Status PENDING  Incomplete  Culture, blood (routine x 2)     Status: None (Preliminary result)   Collection Time: 12/07/20 12:23 AM   Specimen: Left Antecubital; Blood  Result Value Ref Range Status   Specimen Description   Final    LEFT ANTECUBITAL Performed at [facility], [address] [place], [place] [address]    Special Requests   Final    Blood Culture adequate volume BOTTLES DRAWN AEROBIC ONLY   Culture  Setup Time   Final    GRAM POSITIVE COCCI IN CLUSTERS AEROBIC BOTTLE ONLY CRITICAL RESULT CALLED TO, READ BACK BY AND VERIFIED WITH: PHARMD C.SHEAD at 0820 on 12/08/2020 by [email or web address]. Performed at [facility], [address] [place], [place] [address]    Culture GRAM POSITIVE COCCI  Final   Report Status PENDING  Incomplete  Blood Culture ID Panel (Reflexed)     Status: Abnormal   Collection Time: 12/07/20 12:23 AM  Result Value Ref Range Status   Enterococcus faecalis NOT DETECTED NOT DETECTED Final   Enterococcus Faecium NOT DETECTED NOT DETECTED Final   Listeria monocytogenes NOT DETECTED NOT DETECTED Final   Staphylococcus species DETECTED (A) NOT DETECTED Final    Comment: CRITICAL RESULT CALLED TO, READ BACK BY AND VERIFIED WITH: PHARMD C.SHED AT 0820 ON 12/08/2020 BY [email or web address].    Staphylococcus aureus (BCID) NOT DETECTED NOT DETECTED Final   Staphylococcus epidermidis NOT DETECTED NOT DETECTED Final   Staphylococcus lugdunensis NOT DETECTED NOT DETECTED Final   Streptococcus species NOT DETECTED NOT DETECTED Final   Streptococcus agalactiae NOT DETECTED NOT DETECTED Final   Streptococcus pneumoniae NOT DETECTED NOT DETECTED Final   Streptococcus pyogenes NOT DETECTED NOT DETECTED Final   A.calcoaceticus-baumannii NOT DETECTED NOT DETECTED Final   Bacteroides fragilis NOT DETECTED NOT DETECTED Final   Enterobacterales NOT DETECTED NOT DETECTED Final   Enterobacter cloacae complex NOT DETECTED NOT DETECTED  Final   Escherichia coli NOT DETECTED NOT DETECTED Final   Klebsiella aerogenes NOT DETECTED NOT DETECTED Final   Klebsiella oxytoca NOT DETECTED NOT DETECTED Final   Klebsiella pneumoniae NOT DETECTED NOT DETECTED Final   Proteus species NOT DETECTED NOT DETECTED Final   Salmonella species NOT DETECTED NOT DETECTED Final   Serratia marcescens NOT DETECTED NOT DETECTED Final   Haemophilus influenzae NOT DETECTED NOT DETECTED Final   Neisseria meningitidis  NOT DETECTED NOT DETECTED Final   Pseudomonas aeruginosa NOT DETECTED NOT DETECTED Final   Stenotrophomonas maltophilia NOT DETECTED NOT DETECTED Final   Candida albicans NOT DETECTED NOT DETECTED Final   Candida auris NOT DETECTED NOT DETECTED Final   Candida glabrata NOT DETECTED NOT DETECTED Final   Candida krusei NOT DETECTED NOT DETECTED Final   Candida parapsilosis NOT DETECTED NOT DETECTED Final   Candida tropicalis NOT DETECTED NOT DETECTED Final   Cryptococcus neoformans/gattii NOT DETECTED NOT DETECTED Final    Comment: Performed at [facility], [address], [place], [place] [address]  MRSA Next Gen by PCR, Nasal     Status: Abnormal   Collection Time: 12/07/20  9:23 AM   Specimen: Nasal Mucosa; Nasal Swab  Result Value Ref Range Status   MRSA by PCR Next Gen DETECTED (A) NOT DETECTED Final    Comment: RESULT CALLED TO, READ BACK BY AND VERIFIED WITH: K BLOOM AT 1852 ON 12/07/2020 BY MOSLEY,J  (NOTE) The GeneXpert MRSA Assay (FDA approved for NASAL specimens only), is one component of a comprehensive MRSA colonization surveillance program. It is not intended to diagnose MRSA infection nor to guide or monitor treatment for MRSA infections. Test performance is not FDA approved in patients less than 47 years old. Performed at [facility], [address], [place], [place] [address]           Radiology Studies: No results found.      Scheduled Meds:  baclofen  20 mg Oral TID    collagenase  1 application Topical Daily   diclofenac Sodium  4 g Topical QID   divalproex  500 mg Oral 2 times per day   enoxaparin (LOVENOX) injection  40 mg Subcutaneous Q24H   feeding supplement  237 mL Oral BID BM   fluticasone  1 spray Each Nare Daily   hydrocerin   Topical BID   iron polysaccharides  150 mg Oral Q1200   loratadine  10 mg Oral Daily   mupirocin ointment   Nasal BID   tretinoin  1 application Topical See admin instructions   Continuous Infusions:  cefTRIAXone (ROCEPHIN)  IV 2 g (12/08/20 2143)   vancomycin 750 mg (12/09/20 0148)     LOS: 3 days    Time spent: 37 MIN    Alwyn Ren, MD  12/09/2020, 10:02 AM

## 2020-12-10 NOTE — Progress Notes (Signed)
PROGRESS NOTE    [name]  HQR:[id number] DOB: Feb 27, 1961 DOA: 12/06/2020 PCP: [place], MD   Brief Narrative: 60 year old male with cerebral palsy peptic ulcer disease, bipolar disorder, history of osteomyelitis, history of DVT, history of chronic pneumonia lives in a group home, he is quadriplegic except the use of his left he has right below-knee amputation he is admitted with worsening sacral wound with increased pain. CT of the abdomen and pelvis showed extensive soft tissue swelling in the left gluteal fold extending to the muscles compatible with cellulitis.  No drainable fluid collection present.  No sinus tract.  Large amount of stool within the rectum.  Right pleural effusion.  Cholelithiasis without evidence of cholecystitis.  Large hiatal hernia.  Assessment & Plan:   Principal Problem:   Cellulitis of sacral region Active Problems:   Intellectual disability   Cerebral palsy, quadriplegic (HCC)   Hx of right BKA (HCC)   Recurrent right pleural effusion   #1 infected sacral wound present on admission patient has had this wound for many months now with purulent drainage drainage and malodor with leukocytosis with surrounding erythema and necrotic base of the wound.   CT consistent with cellulitis. On vancomycin and Rocephin.  MRSA PCR positive 1 out of 4 blood cultures positive for staph aureus Appreciate wound care input and surgery input.  He is status post debridement 12/08/2020.  #2 cerebral palsy with quadriplegia/intellectual disability continue supportive treatment and home meds.  #3 right BKA  #4 constipation CT with high stool burden continue stool softeners as ordered.  #5 pressure injuries as below present on admission  #6 hyperkalemia resolved   Pressure Injury Arm Left;Posterior;Proximal;Upper;Other (Comment) Stage II -  Partial thickness loss of dermis presenting as a shallow open ulcer with a red, pink wound bed without slough. pink open  wound on armpit (Active)     Location: Arm  Location Orientation: Left;Posterior;Proximal;Upper;Other (Comment)  Staging: Stage II -  Partial thickness loss of dermis presenting as a shallow open ulcer with a red, pink wound bed without slough.  Wound Description (Comments): pink open wound on armpit  Present on Admission: Yes     Pressure Injury 07/31/20 Foot Left;Medial Stage 3 -  Full thickness tissue loss. Subcutaneous fat may be visible but bone, tendon or muscle are NOT exposed. (Active)  07/31/20 2152  Location: Foot  Location Orientation: Left;Medial  Staging: Stage 3 -  Full thickness tissue loss. Subcutaneous fat may be visible but bone, tendon or muscle are NOT exposed.  Wound Description (Comments):   Present on Admission: Yes     Pressure Injury 08/01/20 Knee Left;Medial Stage 1 -  Intact skin with non-blanchable redness of a localized area usually over a bony prominence. (Active)  08/01/20   Location: Knee  Location Orientation: Left;Medial  Staging: Stage 1 -  Intact skin with non-blanchable redness of a localized area usually over a bony prominence.  Wound Description (Comments):   Present on Admission: Yes     Pressure Injury 08/01/20 Sacrum Right Unstageable - Full thickness tissue loss in which the base of the injury is covered by slough (yellow, tan, gray, green or brown) and/or eschar (tan, brown or black) in the wound bed. (Active)  08/01/20   Location: Sacrum  Location Orientation: Right  Staging: Unstageable - Full thickness tissue loss in which the base of the injury is covered by slough (yellow, tan, gray, green or brown) and/or eschar (tan, brown or black) in the wound bed.  Wound Description (Comments):   Present on Admission: Yes     Pressure Injury 10/06/20 Thigh Left;Lateral Stage 1 -  Intact skin with non-blanchable redness of a localized area usually over a bony prominence. Yellow wound bed (Active)  10/06/20 0800  Location: Thigh  Location  Orientation: Left;Lateral  Staging: Stage 1 -  Intact skin with non-blanchable redness of a localized area usually over a bony prominence.  Wound Description (Comments): Yellow wound bed  Present on Admission:      Pressure Injury 10/06/20 Thigh Right;Lateral Stage 1 -  Intact skin with non-blanchable redness of a localized area usually over a bony prominence. red nonblanchable (Active)  10/06/20 0800  Location: Thigh  Location Orientation: Right;Lateral  Staging: Stage 1 -  Intact skin with non-blanchable redness of a localized area usually over a bony prominence.  Wound Description (Comments): red nonblanchable  Present on Admission:     Estimated body mass index is 20.33 kg/m as calculated from the following:   Height as of this encounter: 6' (1.829 m).   Weight as of this encounter: 68 kg.  DVT prophylaxis: Lovenox  Code Status: Full code Family Communication: None at bedside Disposition Plan:  Status is: Inpatient  Remains inpatient appropriate because:IV treatments appropriate due to intensity of illness or inability to take PO  Dispo: The patient is from: Group home              Anticipated d/c is to: Group home              Patient currently is not medically stable to d/c.   Difficult to place patient No    Consultants: General surgery WOUND CARE  Procedures: None Antimicrobials: Vanco and Rocephin  Subjective:  He is resting in bed Staff is trying to feed him Objective: Vitals:   12/09/20 1427 12/09/20 2145 12/10/20 0552 12/10/20 1338  BP:  108/65 116/76 (!) 96/57  Pulse:  74 73 86  Resp:  18 18 18   Temp:  98.4 F (36.9 C) 98.1 F (36.7 C) 98 F (36.7 C)  TempSrc:  Oral Oral Oral  SpO2: 97% 98% 96% 93%  Weight:      Height:        Intake/Output Summary (Last 24 hours) at 12/10/2020 1341 Last data filed at 12/10/2020 1206 Gross per 24 hour  Intake 1461.25 ml  Output 1700 ml  Net -238.75 ml    Filed Weights   12/06/20 1148  Weight: 68 kg     Examination:  General exam: Appears calm and comfortable  Respiratory system: Clear to auscultation. Respiratory effort normal. Cardiovascular system: S1 & S2 heard, RRR. No JVD, murmurs, rubs, gallops or clicks. No pedal edema. Gastrointestinal system: Abdomen is nondistended, soft and nontender. No organomegaly or masses felt. Normal bowel sounds heard. Central nervous system: Awake oriented to hospital  extremities: Right leg below-knee amputation contractures of the right upper extremity Skin: Sacral wound with foul-smelling drainage surrounding area is erythematous Psychiatry: Judgement and insight appear normal. Mood & affect appropriate.     Data Reviewed: I have personally reviewed following labs and imaging studies  CBC: Recent Labs  Lab 12/06/20 1251 12/07/20 0023 12/09/20 0417  WBC 12.6* 11.2* 10.8*  NEUTROABS 8.9*  --  7.5  HGB 11.6* 11.0* 11.0*  HCT 37.5* 34.9* 35.0*  MCV 77.3* 76.0* 75.8*  PLT 556* 543* 644*    Basic Metabolic Panel: Recent Labs  Lab 12/06/20 1251 12/07/20 0023 12/09/20 0417  NA 138 136 136  K 5.2* 4.2 3.9  CL 104 103 103  CO2 26 24 27   GLUCOSE 85 85 90  BUN 15 17 9   CREATININE 0.62 0.53* 0.39*  CALCIUM 8.9 8.8* 8.5*    GFR: Estimated Creatinine Clearance: 94.4 mL/min (A) (by C-G formula based on SCr of 0.39 mg/dL (L)). Liver Function Tests: Recent Labs  Lab 12/09/20 0417  AST 15  ALT 15  ALKPHOS 101  BILITOT 0.4  PROT 6.5  ALBUMIN 2.2*    No results for input(s): LIPASE, AMYLASE in the last 168 hours. No results for input(s): AMMONIA in the last 168 hours. Coagulation Profile: No results for input(s): INR, PROTIME in the last 168 hours. Cardiac Enzymes: No results for input(s): CKTOTAL, CKMB, CKMBINDEX, TROPONINI in the last 168 hours. BNP (last 3 results) No results for input(s): PROBNP in the last 8760 hours. HbA1C: No results for input(s): HGBA1C in the last 72 hours. CBG: No results for input(s): GLUCAP in  the last 168 hours. Lipid Profile: No results for input(s): CHOL, HDL, LDLCALC, TRIG, CHOLHDL, LDLDIRECT in the last 72 hours. Thyroid Function Tests: No results for input(s): TSH, T4TOTAL, FREET4, T3FREE, THYROIDAB in the last 72 hours. Anemia Panel: No results for input(s): VITAMINB12, FOLATE, FERRITIN, TIBC, IRON, RETICCTPCT in the last 72 hours. Sepsis Labs: Recent Labs  Lab 12/06/20 1251  LATICACIDVEN 1.7     Recent Results (from the past 240 hour(s))  Resp Panel by RT-PCR (Flu A&B, Covid) Nasopharyngeal Swab     Status: None   Collection Time: 12/06/20  4:51 PM   Specimen: Nasopharyngeal Swab; Nasopharyngeal(NP) swabs in vial transport medium  Result Value Ref Range Status   SARS Coronavirus 2 by RT PCR NEGATIVE NEGATIVE Final    Comment: (NOTE) SARS-CoV-2 target nucleic acids are NOT DETECTED.  The SARS-CoV-2 RNA is generally detectable in upper respiratory specimens during the acute phase of infection. The lowest concentration of SARS-CoV-2 viral copies this assay can detect is 138 copies/mL. A negative result does not preclude SARS-Cov-2 infection and should not be used as the sole basis for treatment or other patient management decisions. A negative result may occur with  improper specimen collection/handling, submission of specimen other than nasopharyngeal swab, presence of viral mutation(s) within the areas targeted by this assay, and inadequate number of viral copies(<138 copies/mL). A negative result must be combined with clinical observations, patient history, and epidemiological information. The expected result is Negative.  Fact Sheet for Patients:  12/08/20  Fact Sheet for Healthcare Providers:  12/08/20  This test is no t yet approved or cleared by the [employer] FDA and  has been authorized for detection and/or diagnosis of SARS-CoV-2 by FDA under an Emergency Use Authorization  (EUA). This EUA will remain  in effect (meaning this test can be used) for the duration of the COVID-19 declaration under Section 564(b)(1) of the Act, 21 U.S.C.section 360bbb-3(b)(1), unless the authorization is terminated  or revoked sooner.       Influenza A by PCR NEGATIVE NEGATIVE Final   Influenza B by PCR NEGATIVE NEGATIVE Final    Comment: (NOTE) The Xpert Xpress SARS-CoV-2/FLU/RSV plus assay is intended as an aid in the diagnosis of influenza from Nasopharyngeal swab specimens and should not be used as a sole basis for treatment. Nasal washings and aspirates are unacceptable for Xpert Xpress SARS-CoV-2/FLU/RSV testing.  Fact Sheet for Patients: [employer]it  Fact Sheet for Healthcare Providers: [place]  This test is not yet approved or cleared by the [employer] FDA  and has been authorized for detection and/or diagnosis of SARS-CoV-2 by FDA under an Emergency Use Authorization (EUA). This EUA will remain in effect (meaning this test can be used) for the duration of the COVID-19 declaration under Section 564(b)(1) of the Act, 21 U.S.C. section 360bbb-3(b)(1), unless the authorization is terminated or revoked.  Performed at [facility], [address] [place], [place] [address]   Culture, blood (routine x 2)     Status: None (Preliminary result)   Collection Time: 12/06/20  4:51 PM   Specimen: BLOOD  Result Value Ref Range Status   Specimen Description   Final    BLOOD BLOOD LEFT HAND Performed at [facility], [address] [place], [place] [address]    Special Requests   Final    BOTTLES DRAWN AEROBIC AND ANAEROBIC Blood Culture adequate volume Performed at [facility], [address] [place], [place] [address]    Culture   Final    NO GROWTH 3 DAYS Performed at [facility], [address] [place], [place] [address]     Report Status PENDING  Incomplete  Culture, blood (routine x 2)     Status: Abnormal   Collection Time: 12/07/20 12:23 AM   Specimen: Left Antecubital; Blood  Result Value Ref Range Status   Specimen Description   Final    LEFT ANTECUBITAL Performed at [name], [address] [place], [place] [address]    Special Requests   Final    Blood Culture adequate volume BOTTLES DRAWN AEROBIC ONLY   Culture  Setup Time   Final    GRAM POSITIVE COCCI IN CLUSTERS AEROBIC BOTTLE ONLY CRITICAL RESULT CALLED TO, READ BACK BY AND VERIFIED WITH: PHARMD C.SHEAD at 0820 on 12/08/2020 by [email or web address].    Culture (A)  Final    STAPHYLOCOCCUS HOMINIS THE SIGNIFICANCE OF ISOLATING THIS ORGANISM FROM A SINGLE SET OF BLOOD CULTURES WHEN MULTIPLE SETS ARE DRAWN IS UNCERTAIN. PLEASE NOTIFY THE MICROBIOLOGY DEPARTMENT WITHIN ONE WEEK IF SPECIATION AND SENSITIVITIES ARE REQUIRED. Performed at [facility], [address] [place], [place] [address]    Report Status 12/09/2020 FINAL  Final  Blood Culture ID Panel (Reflexed)     Status: Abnormal   Collection Time: 12/07/20 12:23 AM  Result Value Ref Range Status   Enterococcus faecalis NOT DETECTED NOT DETECTED Final   Enterococcus Faecium NOT DETECTED NOT DETECTED Final   Listeria monocytogenes NOT DETECTED NOT DETECTED Final   Staphylococcus species DETECTED (A) NOT DETECTED Final    Comment: CRITICAL RESULT CALLED TO, READ BACK BY AND VERIFIED WITH: PHARMD C.SHED AT 0820 ON 12/08/2020 BY T.SAAD.    Staphylococcus aureus (BCID) NOT DETECTED NOT DETECTED Final   Staphylococcus epidermidis NOT DETECTED NOT DETECTED Final   Staphylococcus lugdunensis NOT DETECTED NOT DETECTED Final   Streptococcus species NOT DETECTED NOT DETECTED Final   Streptococcus agalactiae NOT DETECTED NOT DETECTED Final   Streptococcus pneumoniae NOT DETECTED NOT DETECTED Final   Streptococcus pyogenes NOT DETECTED NOT DETECTED Final    A.calcoaceticus-baumannii NOT DETECTED NOT DETECTED Final   Bacteroides fragilis NOT DETECTED NOT DETECTED Final   Enterobacterales NOT DETECTED NOT DETECTED Final   Enterobacter cloacae complex NOT DETECTED NOT DETECTED Final   Escherichia coli NOT DETECTED NOT DETECTED Final   Klebsiella aerogenes NOT DETECTED NOT DETECTED Final   Klebsiella oxytoca NOT DETECTED NOT DETECTED Final   Klebsiella pneumoniae NOT DETECTED NOT DETECTED Final  Proteus species NOT DETECTED NOT DETECTED Final   Salmonella species NOT DETECTED NOT DETECTED Final   Serratia marcescens NOT DETECTED NOT DETECTED Final   Haemophilus influenzae NOT DETECTED NOT DETECTED Final   Neisseria meningitidis NOT DETECTED NOT DETECTED Final   Pseudomonas aeruginosa NOT DETECTED NOT DETECTED Final   Stenotrophomonas maltophilia NOT DETECTED NOT DETECTED Final   Candida albicans NOT DETECTED NOT DETECTED Final   Candida auris NOT DETECTED NOT DETECTED Final   Candida glabrata NOT DETECTED NOT DETECTED Final   Candida krusei NOT DETECTED NOT DETECTED Final   Candida parapsilosis NOT DETECTED NOT DETECTED Final   Candida tropicalis NOT DETECTED NOT DETECTED Final   Cryptococcus neoformans/gattii NOT DETECTED NOT DETECTED Final    Comment: Performed at [facility], [address], [place], [place] [address]  MRSA Next Gen by PCR, Nasal     Status: Abnormal   Collection Time: 12/07/20  9:23 AM   Specimen: Nasal Mucosa; Nasal Swab  Result Value Ref Range Status   MRSA by PCR Next Gen DETECTED (A) NOT DETECTED Final    Comment: RESULT CALLED TO, READ BACK BY AND VERIFIED WITH: K BLOOM AT 1852 ON 12/07/2020 BY MOSLEY,J  (NOTE) The GeneXpert MRSA Assay (FDA approved for NASAL specimens only), is one component of a comprehensive MRSA colonization surveillance program. It is not intended to diagnose MRSA infection nor to guide or monitor treatment for MRSA infections. Test performance is not FDA approved in patients less  than 52 years old. Performed at [facility], [address], [place], [place] [address]           Radiology Studies: No results found.      Scheduled Meds:  baclofen  20 mg Oral TID   collagenase  1 application Topical Daily   diclofenac Sodium  4 g Topical QID   divalproex  500 mg Oral 2 times per day   enoxaparin (LOVENOX) injection  40 mg Subcutaneous Q24H   feeding supplement  237 mL Oral BID BM   fluticasone  1 spray Each Nare Daily   hydrocerin   Topical BID   iron polysaccharides  150 mg Oral Q1200   loratadine  10 mg Oral Daily   mupirocin ointment   Nasal BID   tretinoin  1 application Topical See admin instructions   Continuous Infusions:  cefTRIAXone (ROCEPHIN)  IV 2 g (12/09/20 2218)   vancomycin 750 mg (12/10/20 1138)     LOS: 4 days    Time spent: 37 MIN    Alwyn Ren, MD  12/10/2020, 1:41 PM
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
## 2020-12-11 LAB — COMPREHENSIVE METABOLIC PANEL
ALT: 17 U/L (ref 0–44)
AST: 17 U/L (ref 15–41)
Albumin: 2.5 g/dL — ABNORMAL LOW (ref 3.5–5.0)
Alkaline Phosphatase: 109 U/L (ref 38–126)
Anion gap: 7 (ref 5–15)
BUN: 11 mg/dL (ref 6–20)
CO2: 25 mmol/L (ref 22–32)
Calcium: 8.9 mg/dL (ref 8.9–10.3)
Chloride: 106 mmol/L (ref 98–111)
Creatinine, Ser: 0.36 mg/dL — ABNORMAL LOW (ref 0.61–1.24)
GFR, Estimated: >60 mL/min (ref >60)
Glucose, Bld: 83 mg/dL (ref 70–99)
Potassium: 4.3 mmol/L (ref 3.5–5.1)
Sodium: 138 mmol/L (ref 135–145)
Total Bilirubin: 0.6 mg/dL (ref 0.3–1.2)
Total Protein: 6.7 g/dL (ref 6.5–8.1)

## 2020-12-11 LAB — CBC
HCT: 36 % — ABNORMAL LOW (ref 39.0–52.0)
Hemoglobin: 11.2 g/dL — ABNORMAL LOW (ref 13.0–17.0)
MCH: 23.8 pg — ABNORMAL LOW (ref 26.0–34.0)
MCHC: 31.1 g/dL (ref 30.0–36.0)
MCV: 76.6 fL — ABNORMAL LOW (ref 80.0–100.0)
Platelets: 667 10*3/uL — ABNORMAL HIGH (ref 150–400)
RBC: 4.7 MIL/uL (ref 4.22–5.81)
RDW: 18.9 % — ABNORMAL HIGH (ref 11.5–15.5)
WBC: 10 10*3/uL (ref 4.0–10.5)
nRBC: 0 % (ref 0.0–0.2)

## 2020-12-11 LAB — CULTURE, BLOOD (ROUTINE X 2)
Culture: NO GROWTH
Special Requests: ADEQUATE

## 2020-12-11 MED ORDER — AMOXICILLIN-POT CLAVULANATE 875-125 MG PO TABS
1.0000 | ORAL_TABLET | Freq: Two times a day (BID) | ORAL | Status: DC
  Administered 2020-12-11 – 2020-12-14 (×7): 1 via ORAL
  Filled 2020-12-11 (×7): qty 1

## 2020-12-11 MED ORDER — DOXYCYCLINE HYCLATE 100 MG PO TABS
100.0000 mg | ORAL_TABLET | Freq: Two times a day (BID) | ORAL | Status: DC
  Administered 2020-12-11 – 2020-12-14 (×6): 100 mg via ORAL
  Filled 2020-12-11 (×7): qty 1

## 2020-12-11 MED ORDER — GUAIFENESIN-DM 100-10 MG/5ML PO SYRP: 5.0000 mL | ORAL_SOLUTION | ORAL | Status: DC | PRN

## 2020-12-11 NOTE — Progress Notes (Signed)
3 Days Post-Op   Subjective/Chief Complaint: No complaints   Objective: Vital signs in last 24 hours: Temp:  [98 F (36.7 C)-98.7 F (37.1 C)] 98.7 F (37.1 C) (07/17 1949) Pulse Rate:  [79-86] 79 (07/17 1949) Resp:  [18] 18 (07/17 1949) BP: (96-111)/(57-80) 111/80 (07/17 1949) SpO2:  [93 %-97 %] 97 % (07/17 1949) Last BM Date: 12/03/20  Intake/Output from previous day: 07/17 0701 - 07/18 0700 In: 1821.6 [P.O.:1320; IV Piggyback:501.6] Out: 900 [Urine:900] Intake/Output this shift: Total I/O In: 360 [P.O.:360] Out: 50 [Urine:50]  General appearance: alert and cooperative Resp: clear to auscultation bilaterally Cardio: regular rate and rhythm GI: edge of wound looks clean but difficult to see with his contractures  Lab Results:  Recent Labs    12/09/20 0417  WBC 10.8*  HGB 11.0*  HCT 35.0*  PLT 644*   BMET Recent Labs    12/09/20 0417  NA 136  K 3.9  CL 103  CO2 27  GLUCOSE 90  BUN 9  CREATININE 0.39*  CALCIUM 8.5*   PT/INR No results for input(s): LABPROT, INR in the last 72 hours. ABG No results for input(s): PHART, HCO3 in the last 72 hours.  Invalid input(s): PCO2, PO2  Studies/Results: No results found.  Anti-infectives: Anti-infectives (From admission, onward)    Start     Dose/Rate Route Frequency Ordered Stop   12/08/20 1309  sodium chloride 0.9 % with ceFAZolin (ANCEF) ADS Med  Status:  Discontinued       Note to Pharmacy: Linard Millers   : cabinet override      12/08/20 1309 12/08/20 1324   12/08/20 1200  vancomycin (VANCOREADY) IVPB 750 mg/150 mL        750 mg 150 mL/hr over 60 Minutes Intravenous Every 12 hours 12/08/20 0803     12/07/20 1200  vancomycin (VANCOREADY) IVPB 1250 mg/250 mL  Status:  Discontinued        1,250 mg 166.7 mL/hr over 90 Minutes Intravenous Every 12 hours 12/06/20 2354 12/08/20 0803   12/07/20 0000  cefTRIAXone (ROCEPHIN) 2 g in sodium chloride 0.9 % 100 mL IVPB        2 g 200 mL/hr over 30 Minutes  Intravenous Every 24 hours 12/06/20 2258     12/06/20 2345  vancomycin (VANCOCIN) IVPB 1000 mg/200 mL premix  Status:  Discontinued        1,000 mg 200 mL/hr over 60 Minutes Intravenous  Once 12/06/20 2258 12/06/20 2309   12/06/20 2345  vancomycin (VANCOREADY) IVPB 1250 mg/250 mL        1,250 mg 166.7 mL/hr over 90 Minutes Intravenous  Once 12/06/20 2251 12/07/20 0121   12/06/20 1530  ceFAZolin (ANCEF) IVPB 1 g/50 mL premix        1 g 100 mL/hr over 30 Minutes Intravenous  Once 12/06/20 1527 12/06/20 1730       Assessment/Plan: s/p Procedure(s) with comments: INCISION AND DRAINAGE SACRAL DECUBITUS (N/A) - 45 MIN Will check wound at dressing change. Continue dressing changes Will likely need snf until wound can be managed at his group home   LOS: 5 days    [name] 12/11/2020

## 2020-12-11 NOTE — Care Management Important Message (Signed)
Important Message  Patient Details IM Letter given to the Patient. Name: MURL GOLLADAY MRN: [id number] Date of Birth: 09-21-1960   Medicare Important Message Given:  Yes     Caren Macadam 12/11/2020, 12:33 PM

## 2020-12-11 NOTE — [place] FL2 (Signed)
[place] MEDICAID FL2 LEVEL OF CARE SCREENING TOOL     IDENTIFICATION  Patient Name: [name] Birthdate: Feb 06, 1961 Sex: male Admission Date (Current Location): 12/06/2020  [place] and [place] Number:  Haynes Bast [id number] Facility and Address:  [facility],  [address], [place] [address]      Provider Number: [id number]  Attending Physician Name and Address:  Alwyn Ren, MD  Relative Name and Phone Number:  Monna Fam (sister) Ph: [phone]    Current Level of Care: Hospital Recommended Level of Care: Skilled Nursing Facility Prior Approval Number:    Date Approved/Denied:   PASRR Number: Pending  Discharge Plan: SNF    Current Diagnoses: Patient Active Problem List   Diagnosis Date Noted   Cellulitis of sacral region 12/06/2020   Right pulmonary infiltrate on CXR 10/03/2020   Dysphagia 10/03/2020   GERD without esophagitis 10/03/2020   Aspiration pneumonia (HCC) 10/03/2020   Recurrent right pleural effusion 10/02/2020   Abnormal CT scan, colon    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   Nausea and vomiting 07/31/2020   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) 10/08/2019   Osteomyelitis (HCC) 10/08/2019   Acute osteomyelitis of right ankle or foot (HCC)    Ulcerated, foot, right, with necrosis of bone (HCC)    Pseudomonas aeruginosa infection 09/06/2019   MRSA infection 09/06/2019   Lactic acidosis    Stage 3 skin ulcer of sacral region (HCC) 08/10/2018   Gastric wall thickening 08/10/2018   Alkaline phosphatase elevation 08/10/2018   Cholelithiasis 08/10/2018   Microcytic anemia    Hiatal hernia 04/07/2018   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) 01/20/2016   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/15/2015   Cerebral palsy, quadriplegic (HCC)    Pre-diabetes 08/31/2015   Foot ulcer, limited to breakdown of skin (HCC) 06/08/2015   Seasonal allergies 11/01/2014   Abdominal pain    Pressure ulcer of foot  03/14/2014   CAP (community acquired pneumonia) 03/13/2014   Decubitus ulcer of left hip, stage 3 (HCC) 01/08/2013   URINARY INCONTINENCE 02/09/2010   Intellectual disability 07/24/2006   Infantile cerebral palsy (HCC) 07/24/2006    Orientation RESPIRATION BLADDER Height & Weight     Self, Time, Situation, Place  Normal Incontinent Weight: 149 lb 14.6 oz (68 kg) Height:  6' (182.9 cm)  BEHAVIORAL SYMPTOMS/MOOD NEUROLOGICAL BOWEL NUTRITION STATUS      Incontinent Diet (Dysphagia diet)  AMBULATORY STATUS COMMUNICATION OF NEEDS Skin   Total Care Verbally PU Stage and Appropriate Care (Patient has unstageable sacral wound and will be needing SNF for wound care.)       PU Stage 4 Dressing: Daily               Personal Care Assistance Level of Assistance  Bathing, Feeding, Dressing Bathing Assistance: Maximum assistance Feeding assistance: Limited assistance Dressing Assistance: Maximum assistance     Functional Limitations Info  Sight, Hearing, Speech Sight Info: Impaired Hearing Info: Adequate Speech Info: Adequate    SPECIAL CARE FACTORS FREQUENCY                       Contractures Contractures Info: Present    Additional Factors Info  Code Status, Allergies, Psychotropic Code Status Info: Full Allergies Info: Adhesive (Tape) Psychotropic Info: Depakote sprinkles         Current Medications (12/11/2020):  This is the current hospital active medication list Current Facility-Administered Medications  Medication Dose Route Frequency  Provider Last Rate Last Admin   acetaminophen (TYLENOL) tablet 650 mg  650 mg Oral Q6H PRN Kinsinger, De Blanch, MD   650 mg at 12/09/20 2201   Or   acetaminophen (TYLENOL) suppository 650 mg  650 mg Rectal Q6H PRN Kinsinger, De Blanch, MD       albuterol (PROVENTIL) (2.5 MG/3ML) 0.083% nebulizer solution 3 mL  3 mL Inhalation Q4H PRN Kinsinger, De Blanch, MD       alum & mag hydroxide-simeth (MAALOX/MYLANTA) 200-200-20 MG/5ML  suspension 15-30 mL  15-30 mL Oral Q6H PRN Kinsinger, De Blanch, MD       amoxicillin-clavulanate (AUGMENTIN) 875-125 MG per tablet 1 tablet  1 tablet Oral Q12H Alwyn Ren, MD       baclofen (LIORESAL) tablet 20 mg  20 mg Oral TID Kinsinger, De Blanch, MD   20 mg at 12/11/20 1254   bisacodyl (DULCOLAX) EC tablet 5 mg  5 mg Oral Daily PRN Kinsinger, De Blanch, MD   5 mg at 12/11/20 0541   bisacodyl (DULCOLAX) suppository 10 mg  10 mg Rectal Daily PRN Kinsinger, De Blanch, MD       collagenase (SANTYL) ointment 1 application  1 application Topical Daily Kinsinger, De Blanch, MD   1 application at 12/11/20 1252   diclofenac Sodium (VOLTAREN) 1 % topical gel 4 g  4 g Topical QID Kinsinger, De Blanch, MD   4 g at 12/10/20 2155   divalproex (DEPAKOTE SPRINKLE) capsule 500 mg  500 mg Oral 2 times per day Kinsinger, De Blanch, MD   500 mg at 12/11/20 1253   doxycycline (VIBRA-TABS) tablet 100 mg  100 mg Oral Q12H Alwyn Ren, MD       enoxaparin (LOVENOX) injection 40 mg  40 mg Subcutaneous Q24H Kinsinger, De Blanch, MD   40 mg at 12/10/20 2156   feeding supplement (ENSURE ENLIVE / ENSURE PLUS) liquid 237 mL  237 mL Oral BID BM Kinsinger, De Blanch, MD   237 mL at 12/11/20 1255   fluticasone (FLONASE) 50 MCG/ACT nasal spray 1 spray  1 spray Each Nare Daily Kinsinger, De Blanch, MD   1 spray at 12/08/20 1230   hydrocerin (EUCERIN) cream   Topical BID Kinsinger, De Blanch, MD   Given at 12/11/20 1252   HYDROcodone-acetaminophen (NORCO/VICODIN) 5-325 MG per tablet 1-2 tablet  1-2 tablet Oral Q4H PRN Kinsinger, De Blanch, MD   1 tablet at 12/10/20 0610   iron polysaccharides (NIFEREX) capsule 150 mg  150 mg Oral Q1200 Kinsinger, De Blanch, MD   150 mg at 12/11/20 1254   liver oil-zinc oxide (DESITIN) 40 % ointment 1 application  1 application Topical BID PRN Kinsinger, De Blanch, MD       loratadine (CLARITIN) tablet 10 mg  10 mg Oral Daily Kinsinger, De Blanch, MD   10 mg at  12/11/20 1254   magnesium citrate solution 1 Bottle  1 Bottle Oral Once PRN Kinsinger, De Blanch, MD       metoprolol tartrate (LOPRESSOR) injection 5 mg  5 mg Intravenous Q6H PRN Kinsinger, De Blanch, MD       morphine 2 MG/ML injection 2 mg  2 mg Intravenous Q2H PRN Kinsinger, De Blanch, MD   2 mg at 12/11/20 1247   mupirocin ointment (BACTROBAN) 2 %   Nasal BID Alwyn Ren, MD   Given at 12/11/20 1253   ondansetron (ZOFRAN) tablet 4 mg  4 mg Oral Q6H PRN Kinsinger, De Blanch, MD  Or   ondansetron (ZOFRAN) injection 4 mg  4 mg Intravenous Q6H PRN Kinsinger, De Blanch, MD       phenylephrine-shark liver oil-mineral oil-petrolatum (PREPARATION H) rectal ointment 1 application  1 application Rectal BID PRN Kinsinger, De Blanch, MD       senna-docusate (Senokot-S) tablet 1 tablet  1 tablet Oral QHS PRN Kinsinger, De Blanch, MD       tretinoin (RETIN-A) 0.025 % gel 1 application  1 application Topical See admin instructions Kinsinger, De Blanch, MD         Discharge Medications: Please see discharge summary for a list of discharge medications.  Relevant Imaging Results:  Relevant Lab Results:   Additional Information SSN: [id number]  Ewing Schlein, LCSW

## 2020-12-11 NOTE — TOC Progression Note (Signed)
Transition of Care [facility]) - Progression Note   Patient Details  Name: [name] MRN: [id number] Date of Birth: [birth year]  [facility] CM/SW Contact  Ewing Schlein, LCSW Phone Number: 12/11/2020, 4:31 PM  Clinical Narrative: Hospitalist recommended SNF for wound care. Sister is agreeable to referral. FL2 done; PASRR pending. Initial referral faxed out. TOC awaiting PASRR and bed offers.  Barriers to Discharge: Continued Medical Work up  Readmission Risk Interventions Readmission Risk Prevention Plan 08/03/2020  Post Dischage Appt Complete  Medication Screening Complete  Some recent data might be hidden

## 2020-12-11 NOTE — Progress Notes (Signed)
PROGRESS NOTE    ZYGMUNT KURKOWSKI  OZH:[id number] DOB: April 25, 1961 DOA: 12/06/2020 PCP: [place], MD   Brief Narrative: 60 year old male with cerebral palsy peptic ulcer disease, bipolar disorder, history of osteomyelitis, history of DVT, history of chronic pneumonia lives in a group home, he is quadriplegic except the use of his left he has right below-knee amputation he is admitted with worsening sacral wound with increased pain. CT of the abdomen and pelvis showed extensive soft tissue swelling in the left gluteal fold extending to the muscles compatible with cellulitis.  No drainable fluid collection present.  No sinus tract.  Large amount of stool within the rectum.  Right pleural effusion.  Cholelithiasis without evidence of cholecystitis.  Large hiatal hernia.  Assessment & Plan:   Principal Problem:   Cellulitis of sacral region Active Problems:   Intellectual disability   Cerebral palsy, quadriplegic (HCC)   Hx of right BKA (HCC)   Recurrent right pleural effusion   #1 infected sacral wound present on admission patient has had this wound for many months now with purulent drainage drainage and malodor with leukocytosis with surrounding erythema and necrotic base of the wound.   CT consistent with cellulitis. On vancomycin and Rocephin.  MRSA PCR positive 1 out of 4 blood cultures positive for staph aureus Appreciate wound care input and surgery input.  He is status post debridement 12/08/2020.  #2 cerebral palsy with quadriplegia/intellectual disability continue supportive treatment and home meds.  #3 right BKA stable   #4 constipation CT with high stool burden continue stool softeners as ordered.  #5 pressure injuries as below present on admission  #6 hyperkalemia resolved  #7 disposition-patient came from group home, he does not want to go back there and now that he has this infected sacral wound he will need more care which the group home cannot give him.  I have  consulted case manager to find a SNF for him.   Pressure Injury Arm Left;Posterior;Proximal;Upper;Other (Comment) Stage II -  Partial thickness loss of dermis presenting as a shallow open ulcer with a red, pink wound bed without slough. pink open wound on armpit (Active)     Location: Arm  Location Orientation: Left;Posterior;Proximal;Upper;Other (Comment)  Staging: Stage II -  Partial thickness loss of dermis presenting as a shallow open ulcer with a red, pink wound bed without slough.  Wound Description (Comments): pink open wound on armpit  Present on Admission: Yes     Pressure Injury 07/31/20 Foot Left;Medial Stage 3 -  Full thickness tissue loss. Subcutaneous fat may be visible but bone, tendon or muscle are NOT exposed. (Active)  07/31/20 2152  Location: Foot  Location Orientation: Left;Medial  Staging: Stage 3 -  Full thickness tissue loss. Subcutaneous fat may be visible but bone, tendon or muscle are NOT exposed.  Wound Description (Comments):   Present on Admission: Yes     Pressure Injury 08/01/20 Knee Left;Medial Stage 1 -  Intact skin with non-blanchable redness of a localized area usually over a bony prominence. (Active)  08/01/20   Location: Knee  Location Orientation: Left;Medial  Staging: Stage 1 -  Intact skin with non-blanchable redness of a localized area usually over a bony prominence.  Wound Description (Comments):   Present on Admission: Yes     Pressure Injury 08/01/20 Sacrum Right Unstageable - Full thickness tissue loss in which the base of the injury is covered by slough (yellow, tan, gray, green or brown) and/or eschar (tan, brown or black) in  the wound bed. (Active)  08/01/20   Location: Sacrum  Location Orientation: Right  Staging: Unstageable - Full thickness tissue loss in which the base of the injury is covered by slough (yellow, tan, gray, green or brown) and/or eschar (tan, brown or black) in the wound bed.  Wound Description (Comments):   Present  on Admission: Yes     Pressure Injury 10/06/20 Thigh Left;Lateral Stage 1 -  Intact skin with non-blanchable redness of a localized area usually over a bony prominence. Yellow wound bed (Active)  10/06/20 0800  Location: Thigh  Location Orientation: Left;Lateral  Staging: Stage 1 -  Intact skin with non-blanchable redness of a localized area usually over a bony prominence.  Wound Description (Comments): Yellow wound bed  Present on Admission:      Pressure Injury 10/06/20 Thigh Right;Lateral Stage 1 -  Intact skin with non-blanchable redness of a localized area usually over a bony prominence. red nonblanchable (Active)  10/06/20 0800  Location: Thigh  Location Orientation: Right;Lateral  Staging: Stage 1 -  Intact skin with non-blanchable redness of a localized area usually over a bony prominence.  Wound Description (Comments): red nonblanchable  Present on Admission:     Estimated body mass index is 20.33 kg/m as calculated from the following:   Height as of this encounter: 6' (1.829 m).   Weight as of this encounter: 68 kg.  DVT prophylaxis: Lovenox  Code Status: Full code Family Communication: None at bedside Disposition Plan:  Status is: Inpatient  Remains inpatient appropriate because:IV treatments appropriate due to intensity of illness or inability to take PO  Dispo: The patient is from: Group home              Anticipated d/c is to: Group home              Patient currently is not medically stable to d/c.   Difficult to place patient No    Consultants: General surgery WOUND CARE  Procedures: None Antimicrobials: Vanco and Rocephin  Subjective:  No events overnight He is status post sacral wound debridement He does not want to go back to the group home that he was at Objective: Vitals:   12/09/20 2145 12/10/20 0552 12/10/20 1338 12/10/20 1949  BP: 108/65 116/76 (!) 96/57 111/80  Pulse: 74 73 86 79  Resp: 18 18 18 18   Temp: 98.4 F (36.9 C) 98.1 F (36.7  C) 98 F (36.7 C) 98.7 F (37.1 C)  TempSrc: Oral Oral Oral Oral  SpO2: 98% 96% 93% 97%  Weight:      Height:        Intake/Output Summary (Last 24 hours) at 12/11/2020 1144 Last data filed at 12/11/2020 1000 Gross per 24 hour  Intake 1701.58 ml  Output 550 ml  Net 1151.58 ml    Filed Weights   12/06/20 1148  Weight: 68 kg    Examination:  General exam: Appears calm and comfortable  Respiratory system: Clear to auscultation. Respiratory effort normal. Cardiovascular system: S1 & S2 heard, RRR. No JVD, murmurs, rubs, gallops or clicks. No pedal edema. Gastrointestinal system: Abdomen is nondistended, soft and nontender. No organomegaly or masses felt. Normal bowel sounds heard. Central nervous system: Awake oriented to hospital  extremities: Right leg below-knee amputation contractures of the right upper extremity Skin: Sacral wound with foul-smelling drainage surrounding area is erythematous Psychiatry: Judgement and insight appear normal. Mood & affect appropriate.     Data Reviewed: I have personally reviewed following labs and imaging  studies  CBC: Recent Labs  Lab 12/06/20 1251 12/07/20 0023 12/09/20 0417  WBC 12.6* 11.2* 10.8*  NEUTROABS 8.9*  --  7.5  HGB 11.6* 11.0* 11.0*  HCT 37.5* 34.9* 35.0*  MCV 77.3* 76.0* 75.8*  PLT 556* 543* 644*    Basic Metabolic Panel: Recent Labs  Lab 12/06/20 1251 12/07/20 0023 12/09/20 0417  NA 138 136 136  K 5.2* 4.2 3.9  CL 104 103 103  CO2 26 24 27   GLUCOSE 85 85 90  BUN 15 17 9   CREATININE 0.62 0.53* 0.39*  CALCIUM 8.9 8.8* 8.5*    GFR: Estimated Creatinine Clearance: 94.4 mL/min (A) (by C-G formula based on SCr of 0.39 mg/dL (L)). Liver Function Tests: Recent Labs  Lab 12/09/20 0417  AST 15  ALT 15  ALKPHOS 101  BILITOT 0.4  PROT 6.5  ALBUMIN 2.2*    No results for input(s): LIPASE, AMYLASE in the last 168 hours. No results for input(s): AMMONIA in the last 168 hours. Coagulation Profile: No  results for input(s): INR, PROTIME in the last 168 hours. Cardiac Enzymes: No results for input(s): CKTOTAL, CKMB, CKMBINDEX, TROPONINI in the last 168 hours. BNP (last 3 results) No results for input(s): PROBNP in the last 8760 hours. HbA1C: No results for input(s): HGBA1C in the last 72 hours. CBG: No results for input(s): GLUCAP in the last 168 hours. Lipid Profile: No results for input(s): CHOL, HDL, LDLCALC, TRIG, CHOLHDL, LDLDIRECT in the last 72 hours. Thyroid Function Tests: No results for input(s): TSH, T4TOTAL, FREET4, T3FREE, THYROIDAB in the last 72 hours. Anemia Panel: No results for input(s): VITAMINB12, FOLATE, FERRITIN, TIBC, IRON, RETICCTPCT in the last 72 hours. Sepsis Labs: Recent Labs  Lab 12/06/20 1251  LATICACIDVEN 1.7     Recent Results (from the past 240 hour(s))  Resp Panel by RT-PCR (Flu A&B, Covid) Nasopharyngeal Swab     Status: None   Collection Time: 12/06/20  4:51 PM   Specimen: Nasopharyngeal Swab; Nasopharyngeal(NP) swabs in vial transport medium  Result Value Ref Range Status   SARS Coronavirus 2 by RT PCR NEGATIVE NEGATIVE Final    Comment: (NOTE) SARS-CoV-2 target nucleic acids are NOT DETECTED.  The SARS-CoV-2 RNA is generally detectable in upper respiratory specimens during the acute phase of infection. The lowest concentration of SARS-CoV-2 viral copies this assay can detect is 138 copies/mL. A negative result does not preclude SARS-Cov-2 infection and should not be used as the sole basis for treatment or other patient management decisions. A negative result may occur with  improper specimen collection/handling, submission of specimen other than nasopharyngeal swab, presence of viral mutation(s) within the areas targeted by this assay, and inadequate number of viral copies(<138 copies/mL). A negative result must be combined with clinical observations, patient history, and epidemiological information. The expected result is  Negative.  Fact Sheet for Patients:  BloggerCourse.com  Fact Sheet for Healthcare Providers:  SeriousBroker.it  This test is no t yet approved or cleared by the [place] FDA and  has been authorized for detection and/or diagnosis of SARS-CoV-2 by FDA under an Emergency Use Authorization (EUA). This EUA will remain  in effect (meaning this test can be used) for the duration of the COVID-19 declaration under Section 564(b)(1) of the Act, 21 U.S.C.section 360bbb-3(b)(1), unless the authorization is terminated  or revoked sooner.       Influenza A by PCR NEGATIVE NEGATIVE Final   Influenza B by PCR NEGATIVE NEGATIVE Final    Comment: (NOTE) The Xpert  Xpress SARS-CoV-2/FLU/RSV plus assay is intended as an aid in the diagnosis of influenza from Nasopharyngeal swab specimens and should not be used as a sole basis for treatment. Nasal washings and aspirates are unacceptable for Xpert Xpress SARS-CoV-2/FLU/RSV testing.  Fact Sheet for Patients: BloggerCourse.com  Fact Sheet for Healthcare Providers: SeriousBroker.it  This test is not yet approved or cleared by the [place] FDA and has been authorized for detection and/or diagnosis of SARS-CoV-2 by FDA under an Emergency Use Authorization (EUA). This EUA will remain in effect (meaning this test can be used) for the duration of the COVID-19 declaration under Section 564(b)(1) of the Act, 21 U.S.C. section 360bbb-3(b)(1), unless the authorization is terminated or revoked.  Performed at [facility], [address], [place], [place] [address]   Culture, blood (routine x 2)     Status: None   Collection Time: 12/06/20  4:51 PM   Specimen: BLOOD  Result Value Ref Range Status   Specimen Description   Final    BLOOD BLOOD LEFT HAND Performed at [facility], [address]  [place], [place] [address]    Special Requests   Final    BOTTLES DRAWN AEROBIC AND ANAEROBIC Blood Culture adequate volume Performed at [facility], [address], [place], [place] [address]    Culture   Final    NO GROWTH 5 DAYS Performed at [facility], [address], [place], [place] [address]    Report Status 12/11/2020 FINAL  Final  Culture, blood (routine x 2)     Status: Abnormal   Collection Time: 12/07/20 12:23 AM   Specimen: Left Antecubital; Blood  Result Value Ref Range Status   Specimen Description   Final    LEFT ANTECUBITAL Performed at [facility], [address] [place], [place] [address]    Special Requests   Final    Blood Culture adequate volume BOTTLES DRAWN AEROBIC ONLY   Culture  Setup Time   Final    GRAM POSITIVE COCCI IN CLUSTERS AEROBIC BOTTLE ONLY CRITICAL RESULT CALLED TO, READ BACK BY AND VERIFIED WITH: PHARMD C.SHEAD at 0820 on 12/08/2020 by [email or web address].    Culture (A)  Final    STAPHYLOCOCCUS HOMINIS THE SIGNIFICANCE OF ISOLATING THIS ORGANISM FROM A SINGLE SET OF BLOOD CULTURES WHEN MULTIPLE SETS ARE DRAWN IS UNCERTAIN. PLEASE NOTIFY THE MICROBIOLOGY DEPARTMENT WITHIN ONE WEEK IF SPECIATION AND SENSITIVITIES ARE REQUIRED. Performed at [facility], [address], [place], [place] [address]    Report Status 12/09/2020 FINAL  Final  Blood Culture ID Panel (Reflexed)     Status: Abnormal   Collection Time: 12/07/20 12:23 AM  Result Value Ref Range Status   Enterococcus faecalis NOT DETECTED NOT DETECTED Final   Enterococcus Faecium NOT DETECTED NOT DETECTED Final   Listeria monocytogenes NOT DETECTED NOT DETECTED Final   Staphylococcus species DETECTED (A) NOT DETECTED Final    Comment: CRITICAL RESULT CALLED TO, READ BACK BY AND VERIFIED WITH: PHARMD C.SHED AT 0820 ON 12/08/2020 BY T.SAAD.    Staphylococcus aureus (BCID) NOT DETECTED NOT DETECTED Final   Staphylococcus epidermidis NOT DETECTED NOT  DETECTED Final   Staphylococcus lugdunensis NOT DETECTED NOT DETECTED Final   Streptococcus species NOT DETECTED NOT DETECTED Final   Streptococcus agalactiae NOT DETECTED NOT DETECTED Final   Streptococcus pneumoniae NOT DETECTED NOT DETECTED Final   Streptococcus pyogenes NOT DETECTED NOT DETECTED Final   A.calcoaceticus-baumannii NOT DETECTED NOT DETECTED  Final   Bacteroides fragilis NOT DETECTED NOT DETECTED Final   Enterobacterales NOT DETECTED NOT DETECTED Final   Enterobacter cloacae complex NOT DETECTED NOT DETECTED Final   Escherichia coli NOT DETECTED NOT DETECTED Final   Klebsiella aerogenes NOT DETECTED NOT DETECTED Final   Klebsiella oxytoca NOT DETECTED NOT DETECTED Final   Klebsiella pneumoniae NOT DETECTED NOT DETECTED Final   Proteus species NOT DETECTED NOT DETECTED Final   Salmonella species NOT DETECTED NOT DETECTED Final   Serratia marcescens NOT DETECTED NOT DETECTED Final   Haemophilus influenzae NOT DETECTED NOT DETECTED Final   Neisseria meningitidis NOT DETECTED NOT DETECTED Final   Pseudomonas aeruginosa NOT DETECTED NOT DETECTED Final   Stenotrophomonas maltophilia NOT DETECTED NOT DETECTED Final   Candida albicans NOT DETECTED NOT DETECTED Final   Candida auris NOT DETECTED NOT DETECTED Final   Candida glabrata NOT DETECTED NOT DETECTED Final   Candida krusei NOT DETECTED NOT DETECTED Final   Candida parapsilosis NOT DETECTED NOT DETECTED Final   Candida tropicalis NOT DETECTED NOT DETECTED Final   Cryptococcus neoformans/gattii NOT DETECTED NOT DETECTED Final    Comment: Performed at [facility], [address], [place], [place] [address]  MRSA Next Gen by PCR, Nasal     Status: Abnormal   Collection Time: 12/07/20  9:23 AM   Specimen: Nasal Mucosa; Nasal Swab  Result Value Ref Range Status   MRSA by PCR Next Gen DETECTED (A) NOT DETECTED Final    Comment: RESULT CALLED TO, READ BACK BY AND VERIFIED WITH: K BLOOM AT 1852 ON 12/07/2020 BY MOSLEY,J   (NOTE) The GeneXpert MRSA Assay (FDA approved for NASAL specimens only), is one component of a comprehensive MRSA colonization surveillance program. It is not intended to diagnose MRSA infection nor to guide or monitor treatment for MRSA infections. Test performance is not FDA approved in patients less than 33 years old. Performed at [facility], [address], [place], [place] [address]           Radiology Studies: No results found.      Scheduled Meds:  baclofen  20 mg Oral TID   collagenase  1 application Topical Daily   diclofenac Sodium  4 g Topical QID   divalproex  500 mg Oral 2 times per day   enoxaparin (LOVENOX) injection  40 mg Subcutaneous Q24H   feeding supplement  237 mL Oral BID BM   fluticasone  1 spray Each Nare Daily   hydrocerin   Topical BID   iron polysaccharides  150 mg Oral Q1200   loratadine  10 mg Oral Daily   mupirocin ointment   Nasal BID   tretinoin  1 application Topical See admin instructions   Continuous Infusions:  cefTRIAXone (ROCEPHIN)  IV 2 g (12/10/20 2209)   vancomycin 750 mg (12/10/20 2317)     LOS: 5 days    Time spent: 37 MIN    Alwyn Ren, MD  12/11/2020, 11:44 AM

## 2020-12-11 NOTE — Care Management (Cosign Needed)
RE: [name] DOB: December 13, 1960 Date: 12/11/2020 MUST ID: [id number]   To Whom It May Concern:  Please be advised that the above name patient will require a short-term nursing home stay--anticipated 30 days or less rehabilitation and strengthening. The plan is for return to group home.

## 2020-12-11 NOTE — Progress Notes (Addendum)
  Wound measures 4 x 4 x 1 cm. As pictured above. Some drainage on dressing. No drainage from wound. It does not tunnel or track. Peri-wound with some faint erythema that appears to be 2/2 pressure. No induration or heat. Continue BID WTD. Start Hydrotherapy. Will see again Wed. Discussed with MD who agrees with plan.   Leary Roca, PA-C

## 2020-12-12 NOTE — Progress Notes (Addendum)
12/12/20 1400 Hydrotherapy evaluation. [id number]  Subjective Assessment  Subjective It's going to hurt, you can/t roll me over.  Patient and Family Stated Goals agreed to  hydro treatment  Date of Onset  (present on admission)  Prior Treatments dressing changes at group home  Evaluation and Treatment  Evaluation and Treatment Procedures Explained to Patient/Family Yes  Evaluation and Treatment Procedures agreed to  Pressure Injury 12/12/20 Sacrum Right Unstageable - Full thickness tissue loss in which the base of the injury is covered by slough (yellow, tan, gray, green or brown) and/or eschar (tan, brown or black) in the wound bed. PT only, wound on right sacral a  Date First Assessed: 12/12/20   Location: Sacrum  Location Orientation: Right  Staging: Unstageable - Full thickness tissue loss in which the base of the injury is covered by slough (yellow, tan, gray, green or brown) and/or eschar (tan, brown or blac...  Dressing Type Moist to dry;Normal saline moist dressing;Santyl;ABD  Dressing Changed  Dressing Change Frequency Daily  State of Healing Non-healing  Site / Wound Assessment Painful;Yellow  % Wound base Red or Granulating 0% (difficult to see wound iside)  % Wound base Yellow/Fibrinous Exudate 100%  Peri-wound Assessment Intact  Wound Length (cm) 4 cm  Wound Width (cm) 4 cm  Wound Depth (cm) 1 cm  Wound Surface Area (cm^2) 16 cm^2  Wound Volume (cm^3) 16 cm^3  Undermining (cm) 2 \ (from 12-3:00)  Margins Unattached edges (unapproximated)  Drainage Amount Moderate  Drainage Description Green;Odor (urine soaked)  Treatment Hydrotherapy (Pulse lavage);Packing (Saline gauze)  Hydrotherapy  Pulsed Lavage with Suction (psi) 12 psi  Pulsed Lavage with Suction - Normal Saline Used 1000 mL  Pulsed Lavage Tip Tip with splash shield  Pulsed lavage therapy - wound location sacrum  Wound Therapy - Assess/Plan/Recommendations  Wound Therapy - Clinical Statement Patient  presents from Group home with worsening  Left sacral wound. Drainage is green on old dressing removed. Patient indicates pain when touching near wound and during PLS. Patient did tolerate full . Patient had been premedicated.Patient has H/O cerebral palsy with severe extremity contractures. Patient is very difficult to roll and position due to contractures. Patient  will benefit from Hydrotherapy to improve drainge qualities and reduce bioburden effects. Patient's    catheter had come off and bed was soaked in urine. Patient unable to tolerate any attempts to sharp debride as well as wound is small and  difficult to roll  enough to safely introduce sharp debridement utensils.  Wound Therapy - Functional Problem List quadriplegic , contractures, incontinence  Factors Delaying/Impairing Wound Healing Incontinence;Immobility  Hydrotherapy Plan Debridement;Patient/family education;Dressing change;Pulsatile lavage with suction  Wound Therapy - Frequency 6X / week  Wound Therapy - Current Recommendations Case manager/social work  Wound Therapy - Follow Up Recommendations f/u pulsed lavage with suction;f/u selective debridement  Wound Therapy Goals - Improve the function of patient's integumentary system by progressing the wound(s) through the phases of wound healing by:  Decrease Necrotic Tissue to 50  Decrease Necrotic Tissue - Progress Goal set today  Increase Granulation Tissue to 50  Increase Granulation Tissue - Progress Goal set today  Improve Drainage Characteristics Min  Improve Drainage Characteristics - Progress Goal set today  Patient/Family will be able to  direct repositioning  Patient/Family Instruction Goal - Progress Goal set today  Goals/treatment plan/discharge plan were made with and agreed upon by patient/family Yes  Time For Goal Achievement 2 weeks  Wound Therapy - Potential for Goals Fair  Blanchard Kelch PT Acute Rehabilitation Services Pager [phone] Office  [phone]  Eval, low, debr up to  20CM2, 1 DRess

## 2020-12-12 NOTE — Progress Notes (Signed)
PROGRESS NOTE    [name]  ZOX:[id number] DOB: Feb 02, 1961 DOA: 12/06/2020 PCP: Harvest ForestBakare, Mobolaji B, MD   Brief Narrative: 60 year old male with cerebral palsy peptic ulcer disease, bipolar disorder, history of osteomyelitis, history of DVT, history of chronic pneumonia lives in a group home, he is quadriplegic except the use of his left he has right below-knee amputation he is admitted with worsening sacral wound with increased pain. CT of the abdomen and pelvis showed extensive soft tissue swelling in the left gluteal fold extending to the muscles compatible with cellulitis.  No drainable fluid collection present.  No sinus tract.  Large amount of stool within the rectum.  Right pleural effusion.  Cholelithiasis without evidence of cholecystitis.  Large hiatal hernia.  Assessment & Plan:   Principal Problem:   Cellulitis of sacral region Active Problems:   Intellectual disability   Cerebral palsy, quadriplegic (HCC)   Hx of right BKA (HCC)   Recurrent right pleural effusion   #1 infected sacral wound present on admission patient has had this wound for many months now with purulent drainage drainage and malodor with leukocytosis with surrounding erythema and necrotic base of the wound.   CT consistent with cellulitis. On vancomycin and Rocephin.  MRSA PCR positive 1 out of 4 blood cultures positive for staph aureus Appreciate wound care input and surgery input.  He is status post debridement 12/08/2020. Wound measures 4 x 4 x 1 cm.  Surgery recommending hydrotherapy and twice daily wet-to-dry dressings.  #2 cerebral palsy with quadriplegia/intellectual disability continue supportive treatment and home meds.  #3 right BKA stable   #4 constipation CT with high stool burden continue stool softeners as ordered.  #5 pressure injuries as below present on admission  #6 hyperkalemia resolved  #7 disposition-patient came from group home, he does not want to go back there and now that  he has this infected sacral wound he will need more care which the group home cannot give him.  I have consulted case manager to find a SNF for him.   Pressure Injury Arm Left;Posterior;Proximal;Upper;Other (Comment) Stage II -  Partial thickness loss of dermis presenting as a shallow open ulcer with a red, pink wound bed without slough. pink open wound on armpit (Active)     Location: Arm  Location Orientation: Left;Posterior;Proximal;Upper;Other (Comment)  Staging: Stage II -  Partial thickness loss of dermis presenting as a shallow open ulcer with a red, pink wound bed without slough.  Wound Description (Comments): pink open wound on armpit  Present on Admission: Yes     Pressure Injury 07/31/20 Foot Left;Medial Stage 3 -  Full thickness tissue loss. Subcutaneous fat may be visible but bone, tendon or muscle are NOT exposed. (Active)  07/31/20 2152  Location: Foot  Location Orientation: Left;Medial  Staging: Stage 3 -  Full thickness tissue loss. Subcutaneous fat may be visible but bone, tendon or muscle are NOT exposed.  Wound Description (Comments):   Present on Admission: Yes     Pressure Injury 08/01/20 Knee Left;Medial Stage 1 -  Intact skin with non-blanchable redness of a localized area usually over a bony prominence. (Active)  08/01/20   Location: Knee  Location Orientation: Left;Medial  Staging: Stage 1 -  Intact skin with non-blanchable redness of a localized area usually over a bony prominence.  Wound Description (Comments):   Present on Admission: Yes     Pressure Injury 08/01/20 Sacrum Right Unstageable - Full thickness tissue loss in which the base of the injury  is covered by slough (yellow, tan, gray, green or brown) and/or eschar (tan, brown or black) in the wound bed. (Active)  08/01/20   Location: Sacrum  Location Orientation: Right  Staging: Unstageable - Full thickness tissue loss in which the base of the injury is covered by slough (yellow, tan, gray, green or  brown) and/or eschar (tan, brown or black) in the wound bed.  Wound Description (Comments):   Present on Admission: Yes     Pressure Injury 10/06/20 Thigh Left;Lateral Stage 1 -  Intact skin with non-blanchable redness of a localized area usually over a bony prominence. Yellow wound bed (Active)  10/06/20 0800  Location: Thigh  Location Orientation: Left;Lateral  Staging: Stage 1 -  Intact skin with non-blanchable redness of a localized area usually over a bony prominence.  Wound Description (Comments): Yellow wound bed  Present on Admission:      Pressure Injury 10/06/20 Thigh Right;Lateral Stage 1 -  Intact skin with non-blanchable redness of a localized area usually over a bony prominence. red nonblanchable (Active)  10/06/20 0800  Location: Thigh  Location Orientation: Right;Lateral  Staging: Stage 1 -  Intact skin with non-blanchable redness of a localized area usually over a bony prominence.  Wound Description (Comments): red nonblanchable  Present on Admission:     Estimated body mass index is 20.33 kg/m as calculated from the following:   Height as of this encounter: 6' (1.829 m).   Weight as of this encounter: 68 kg.  DVT prophylaxis: Lovenox  Code Status: Full code Family Communication: None at bedside Disposition Plan:  Status is: Inpatient  Remains inpatient appropriate because:IV treatments appropriate due to intensity of illness or inability to take PO  Dispo: The patient is from: Group home              Anticipated d/c is to: Group home              Patient currently is not medically stable to d/c.   Difficult to place patient No    Consultants: General surgery WOUND CARE  Procedures: None Antimicrobials: Vanco and Rocephin  Subjective: No events overnight.  He is resting in bed.   Objective: Vitals:   12/10/20 1949 12/11/20 1413 12/11/20 2230 12/12/20 0630  BP: 111/80 98/69 119/65 121/60  Pulse: 79 96 76 87  Resp: 18 18 17 17   Temp: 98.7 F (37.1  C) 98.9 F (37.2 C) 98.9 F (37.2 C) 98.3 F (36.8 C)  TempSrc: Oral Oral Oral Oral  SpO2: 97% 94% 95% 95%  Weight:      Height:        Intake/Output Summary (Last 24 hours) at 12/12/2020 1327 Last data filed at 12/12/2020 1000 Gross per 24 hour  Intake 748.95 ml  Output 1175 ml  Net -426.05 ml    Filed Weights   12/06/20 1148  Weight: 68 kg    Examination:  General exam: Appears calm and comfortable  Respiratory system: Clear to auscultation. Respiratory effort normal. Cardiovascular system: S1 & S2 heard, RRR. No JVD, murmurs, rubs, gallops or clicks. No pedal edema. Gastrointestinal system: Abdomen is nondistended, soft and nontender. No organomegaly or masses felt. Normal bowel sounds heard. Central nervous system: Awake oriented to hospital  extremities: Right leg below-knee amputation contractures of the right upper extremity Skin: Sacral wound with foul-smelling drainage surrounding area is erythematous Psychiatry: Judgement and insight appear normal. Mood & affect appropriate.     Data Reviewed: I have personally reviewed following labs and  imaging studies  CBC: Recent Labs  Lab 12/06/20 1251 12/07/20 0023 12/09/20 0417 12/11/20 1212  WBC 12.6* 11.2* 10.8* 10.0  NEUTROABS 8.9*  --  7.5  --   HGB 11.6* 11.0* 11.0* 11.2*  HCT 37.5* 34.9* 35.0* 36.0*  MCV 77.3* 76.0* 75.8* 76.6*  PLT 556* 543* 644* 667*    Basic Metabolic Panel: Recent Labs  Lab 12/06/20 1251 12/07/20 0023 12/09/20 0417 12/11/20 1212  NA 138 136 136 138  K 5.2* 4.2 3.9 4.3  CL 104 103 103 106  CO2 26 24 27 25   GLUCOSE 85 85 90 83  BUN 15 17 9 11   CREATININE 0.62 0.53* 0.39* 0.36*  CALCIUM 8.9 8.8* 8.5* 8.9    GFR: Estimated Creatinine Clearance: 94.4 mL/min (A) (by C-G formula based on SCr of 0.36 mg/dL (L)). Liver Function Tests: Recent Labs  Lab 12/09/20 0417 12/11/20 1212  AST 15 17  ALT 15 17  ALKPHOS 101 109  BILITOT 0.4 0.6  PROT 6.5 6.7  ALBUMIN 2.2* 2.5*     No results for input(s): LIPASE, AMYLASE in the last 168 hours. No results for input(s): AMMONIA in the last 168 hours. Coagulation Profile: No results for input(s): INR, PROTIME in the last 168 hours. Cardiac Enzymes: No results for input(s): CKTOTAL, CKMB, CKMBINDEX, TROPONINI in the last 168 hours. BNP (last 3 results) No results for input(s): PROBNP in the last 8760 hours. HbA1C: No results for input(s): HGBA1C in the last 72 hours. CBG: No results for input(s): GLUCAP in the last 168 hours. Lipid Profile: No results for input(s): CHOL, HDL, LDLCALC, TRIG, CHOLHDL, LDLDIRECT in the last 72 hours. Thyroid Function Tests: No results for input(s): TSH, T4TOTAL, FREET4, T3FREE, THYROIDAB in the last 72 hours. Anemia Panel: No results for input(s): VITAMINB12, FOLATE, FERRITIN, TIBC, IRON, RETICCTPCT in the last 72 hours. Sepsis Labs: Recent Labs  Lab 12/06/20 1251  LATICACIDVEN 1.7     Recent Results (from the past 240 hour(s))  Resp Panel by RT-PCR (Flu A&B, Covid) Nasopharyngeal Swab     Status: None   Collection Time: 12/06/20  4:51 PM   Specimen: Nasopharyngeal Swab; Nasopharyngeal(NP) swabs in vial transport medium  Result Value Ref Range Status   SARS Coronavirus 2 by RT PCR NEGATIVE NEGATIVE Final    Comment: (NOTE) SARS-CoV-2 target nucleic acids are NOT DETECTED.  The SARS-CoV-2 RNA is generally detectable in upper respiratory specimens during the acute phase of infection. The lowest concentration of SARS-CoV-2 viral copies this assay can detect is 138 copies/mL. A negative result does not preclude SARS-Cov-2 infection and should not be used as the sole basis for treatment or other patient management decisions. A negative result may occur with  improper specimen collection/handling, submission of specimen other than nasopharyngeal swab, presence of viral mutation(s) within the areas targeted by this assay, and inadequate number of viral copies(<138  copies/mL). A negative result must be combined with clinical observations, patient history, and epidemiological information. The expected result is Negative.  Fact Sheet for Patients:  12/08/20  Fact Sheet for Healthcare Providers:  12/08/20  This test is no t yet approved or cleared by the [employer] FDA and  has been authorized for detection and/or diagnosis of SARS-CoV-2 by FDA under an Emergency Use Authorization (EUA). This EUA will remain  in effect (meaning this test can be used) for the duration of the COVID-19 declaration under Section 564(b)(1) of the Act, 21 U.S.C.section 360bbb-3(b)(1), unless the authorization is terminated  or revoked sooner.  Influenza A by PCR NEGATIVE NEGATIVE Final   Influenza B by PCR NEGATIVE NEGATIVE Final    Comment: (NOTE) The Xpert Xpress SARS-CoV-2/FLU/RSV plus assay is intended as an aid in the diagnosis of influenza from Nasopharyngeal swab specimens and should not be used as a sole basis for treatment. Nasal washings and aspirates are unacceptable for Xpert Xpress SARS-CoV-2/FLU/RSV testing.  Fact Sheet for Patients: [employer]  Fact Sheet for Healthcare Providers: [employer]  This test is not yet approved or cleared by the [place] FDA and has been authorized for detection and/or diagnosis of SARS-CoV-2 by FDA under an Emergency Use Authorization (EUA). This EUA will remain in effect (meaning this test can be used) for the duration of the COVID-19 declaration under Section 564(b)(1) of the Act, 21 U.S.C. section 360bbb-3(b)(1), unless the authorization is terminated or revoked.  Performed at [facility], [address], [place], [place] [address]   Culture, blood (routine x 2)     Status: None   Collection Time: 12/06/20  4:51 PM   Specimen: BLOOD  Result Value Ref  Range Status   Specimen Description   Final    BLOOD BLOOD LEFT HAND Performed at [facility], [address] [place], [place] [address]    Special Requests   Final    BOTTLES DRAWN AEROBIC AND ANAEROBIC Blood Culture adequate volume Performed at [facility], [address], [place], [place] [address]    Culture   Final    NO GROWTH 5 DAYS Performed at [facility], [address], [place], [place] [address]    Report Status 12/11/2020 FINAL  Final  Culture, blood (routine x 2)     Status: Abnormal   Collection Time: 12/07/20 12:23 AM   Specimen: Left Antecubital; Blood  Result Value Ref Range Status   Specimen Description   Final    LEFT ANTECUBITAL Performed at [facility], [address], [place], [place] [address]    Special Requests   Final    Blood Culture adequate volume BOTTLES DRAWN AEROBIC ONLY   Culture  Setup Time   Final    GRAM POSITIVE COCCI IN CLUSTERS AEROBIC BOTTLE ONLY CRITICAL RESULT CALLED TO, READ BACK BY AND VERIFIED WITH: PHARMD C.SHEAD at 0820 on 12/08/2020 by [email or web address].    Culture (A)  Final    STAPHYLOCOCCUS HOMINIS THE SIGNIFICANCE OF ISOLATING THIS ORGANISM FROM A SINGLE SET OF BLOOD CULTURES WHEN MULTIPLE SETS ARE DRAWN IS UNCERTAIN. PLEASE NOTIFY THE MICROBIOLOGY DEPARTMENT WITHIN ONE WEEK IF SPECIATION AND SENSITIVITIES ARE REQUIRED. Performed at [facility], [address], [place], [place] [address]    Report Status 12/09/2020 FINAL  Final  Blood Culture ID Panel (Reflexed)     Status: Abnormal   Collection Time: 12/07/20 12:23 AM  Result Value Ref Range Status   Enterococcus faecalis NOT DETECTED NOT DETECTED Final   Enterococcus Faecium NOT DETECTED NOT DETECTED Final   Listeria monocytogenes NOT DETECTED NOT DETECTED Final   Staphylococcus species DETECTED (A) NOT DETECTED Final    Comment: CRITICAL RESULT CALLED TO, READ BACK BY AND VERIFIED WITH: PHARMD C.SHED AT  0820 ON 12/08/2020 BY T.SAAD.    Staphylococcus aureus (BCID) NOT DETECTED NOT DETECTED Final   Staphylococcus epidermidis NOT DETECTED NOT DETECTED Final   Staphylococcus lugdunensis NOT DETECTED NOT DETECTED Final   Streptococcus species NOT DETECTED NOT DETECTED Final   Streptococcus agalactiae NOT DETECTED NOT DETECTED Final  Streptococcus pneumoniae NOT DETECTED NOT DETECTED Final   Streptococcus pyogenes NOT DETECTED NOT DETECTED Final   A.calcoaceticus-baumannii NOT DETECTED NOT DETECTED Final   Bacteroides fragilis NOT DETECTED NOT DETECTED Final   Enterobacterales NOT DETECTED NOT DETECTED Final   Enterobacter cloacae complex NOT DETECTED NOT DETECTED Final   Escherichia coli NOT DETECTED NOT DETECTED Final   Klebsiella aerogenes NOT DETECTED NOT DETECTED Final   Klebsiella oxytoca NOT DETECTED NOT DETECTED Final   Klebsiella pneumoniae NOT DETECTED NOT DETECTED Final   Proteus species NOT DETECTED NOT DETECTED Final   Salmonella species NOT DETECTED NOT DETECTED Final   Serratia marcescens NOT DETECTED NOT DETECTED Final   Haemophilus influenzae NOT DETECTED NOT DETECTED Final   Neisseria meningitidis NOT DETECTED NOT DETECTED Final   Pseudomonas aeruginosa NOT DETECTED NOT DETECTED Final   Stenotrophomonas maltophilia NOT DETECTED NOT DETECTED Final   Candida albicans NOT DETECTED NOT DETECTED Final   Candida auris NOT DETECTED NOT DETECTED Final   Candida glabrata NOT DETECTED NOT DETECTED Final   Candida krusei NOT DETECTED NOT DETECTED Final   Candida parapsilosis NOT DETECTED NOT DETECTED Final   Candida tropicalis NOT DETECTED NOT DETECTED Final   Cryptococcus neoformans/gattii NOT DETECTED NOT DETECTED Final    Comment: Performed at [facility], [address], [place], [place] [address]  MRSA Next Gen by PCR, Nasal     Status: Abnormal   Collection Time: 12/07/20  9:23 AM   Specimen: Nasal Mucosa; Nasal Swab  Result Value Ref Range Status   MRSA by PCR  Next Gen DETECTED (A) NOT DETECTED Final    Comment: RESULT CALLED TO, READ BACK BY AND VERIFIED WITH: K BLOOM AT 1852 ON 12/07/2020 BY MOSLEY,J  (NOTE) The GeneXpert MRSA Assay (FDA approved for NASAL specimens only), is one component of a comprehensive MRSA colonization surveillance program. It is not intended to diagnose MRSA infection nor to guide or monitor treatment for MRSA infections. Test performance is not FDA approved in patients less than 44 years old. Performed at [facility], [address], [place], [place] [address]           Radiology Studies: No results found.      Scheduled Meds:  amoxicillin-clavulanate  1 tablet Oral Q12H   baclofen  20 mg Oral TID   collagenase  1 application Topical Daily   diclofenac Sodium  4 g Topical QID   divalproex  500 mg Oral 2 times per day   doxycycline  100 mg Oral Q12H   enoxaparin (LOVENOX) injection  40 mg Subcutaneous Q24H   feeding supplement  237 mL Oral BID BM   fluticasone  1 spray Each Nare Daily   hydrocerin   Topical BID   iron polysaccharides  150 mg Oral Q1200   loratadine  10 mg Oral Daily   mupirocin ointment   Nasal BID   tretinoin  1 application Topical See admin instructions   Continuous Infusions:     LOS: 6 days    Time spent: 39 MIN    Alwyn Ren, MD  12/12/2020, 1:27 PM

## 2020-12-13 ENCOUNTER — Encounter (HOSPITAL_BASED_OUTPATIENT_CLINIC_OR_DEPARTMENT_OTHER): Payer: Medicare Other | Admitting: Internal Medicine

## 2020-12-13 ENCOUNTER — Encounter (HOSPITAL_COMMUNITY): Payer: Self-pay | Admitting: Internal Medicine

## 2020-12-13 DIAGNOSIS — L89150 Pressure ulcer of sacral region, unstageable: Secondary | ICD-10-CM

## 2020-12-13 DIAGNOSIS — F79 Unspecified intellectual disabilities: Secondary | ICD-10-CM

## 2020-12-13 MED ORDER — LACTULOSE 10 GM/15ML PO SOLN
10.0000 g | Freq: Every day | ORAL | Status: DC
  Administered 2020-12-13 – 2020-12-15 (×3): 10 g via ORAL
  Filled 2020-12-13 (×3): qty 15

## 2020-12-13 NOTE — Progress Notes (Signed)
Patient has been asked many times during shift about changing positions. He said he is comfortable in the position he is in and does not want to be moved.

## 2020-12-13 NOTE — Care Management (Addendum)
RE: [name] DOB: 10/14/1960 Date: 12/11/2020 MUST ID: [id number]   To Whom It May Concern:  Please be advised that the above name patient will require a short-term nursing home stay--anticipated 30 days or less rehabilitation and strengthening. The plan is for return to group home. 

## 2020-12-13 NOTE — Progress Notes (Signed)
   12/13/20 1400  Hydrotherapy note  Subjective Assessment  Subjective You'll be back  Patient and Family Stated Goals agreed to  hydro treatment  Date of Onset  (present)  Prior Treatments dressing changes at group home  Evaluation and Treatment  Evaluation and Treatment Procedures Explained to Patient/Family Yes  Pressure Injury 12/12/20 Sacrum Right Unstageable - Full thickness tissue loss in which the base of the injury is covered by slough (yellow, tan, gray, green or brown) and/or eschar (tan, brown or black) in the wound bed. PT only, wound on right sacral a  Date First Assessed: 12/12/20   Location: Sacrum  Location Orientation: Right  Staging: Unstageable - Full thickness tissue loss in which the base of the injury is covered by slough (yellow, tan, gray, green or brown) and/or eschar (tan, brown or blac...  Dressing Type ABD;Moist to dry;Santyl;Paper tape  Dressing Changed  Dressing Change Frequency Twice a day  State of Healing Early/partial granulation  Site / Wound Assessment Painful;Granulation tissue;Yellow  % Wound base Red or Granulating 5%  % Wound base Yellow/Fibrinous Exudate 95%  Peri-wound Assessment Intact;Erythema (blanchable)  Wound Length (cm) 4 cm  Wound Width (cm) 4 cm  Wound Depth (cm) 1 cm  Wound Surface Area (cm^2) 16 cm^2  Wound Volume (cm^3) 16 cm^3  Undermining (cm) 2  Margins Unattached edges (unapproximated)  Drainage Amount Minimal  Drainage Description Green;Odor  Treatment Hydrotherapy (Pulse lavage);Packing (Saline gauze);Debridement (Selective)  Hydrotherapy  Pulsed Lavage with Suction (psi) 12 psi  Pulsed Lavage with Suction - Normal Saline Used 1000 mL  Pulsed Lavage Tip Tip with splash shield  Pulsed lavage therapy - wound location sacrum  Selective Debridement  Selective Debridement - Location left sacrum  Selective Debridement - Tools Used Forceps;Scissors  Selective Debridement - Tissue Removed slough, very little  Wound Therapy -  Assess/Plan/Recommendations  Wound Therapy - Clinical Statement patient tolerated PLS, does report pain at times. Had medication prior.PA for [facility] in to  inspect wound and recommends to  continue PLS  tomorrow and  he will return 8/22 to determine Hydrotherapy continuation. Very Little slough debrided. Very small amount of granulation  tissue in wound bed.  Wound Therapy - Functional Problem List quadriplegic , contractures, incontinence, bed bound.  Factors Delaying/Impairing Wound Healing Incontinence;Immobility  Hydrotherapy Plan Debridement;Dressing change;Patient/family education  Wound Therapy - Frequency 6X / week  Wound Therapy - Current Recommendations Case manager/social work  Wound Therapy - Follow Up Recommendations dressing changes by RN  Wound Therapy Goals - Improve the function of patient's integumentary system by progressing the wound(s) through the phases of wound healing by:  Decrease Necrotic Tissue to 50  Decrease Necrotic Tissue - Progress Progressing toward goal  Increase Granulation Tissue to 50  Increase Granulation Tissue - Progress Progressing toward goal  Improve Drainage Characteristics Min  Improve Drainage Characteristics - Progress Progressing toward goal  Patient/Family will be able to  direct repositioning  Goals/treatment plan/discharge plan were made with and agreed upon by patient/family Yes  Time For Goal Achievement 2 weeks  Wound Therapy - Potential for Goals Fair  Blanchard Kelch PT Acute Rehabilitation Services Pager [phone] Office [phone]  Deb up to 20 CM2, dr x 2

## 2020-12-13 NOTE — Progress Notes (Signed)
5 Days Post-Op  Subjective: CC: Seen with hydro  Objective: Vital signs in last 24 hours: Temp:  [97.9 F (36.6 C)-99.1 F (37.3 C)] 97.9 F (36.6 C) (07/20 3329) Pulse Rate:  [74-105] 74 (07/20 0633) Resp:  [16-18] 17 (07/20 5188) BP: (125-127)/(65-77) 127/74 (07/20 4166) SpO2:  [96 %-97 %] 97 % (07/20 0630) Last BM Date: 12/10/20  Intake/Output from previous day: 07/19 0701 - 07/20 0700 In: 720 [P.O.:720] Out: 1325 [Urine:1325] Intake/Output this shift: Total I/O In: 340 [P.O.:340] Out: -   PE: Wound measures 4 x 4 x 1 cm. As pictured above. Some drainage on dressing. No drainage from wound. It does not tunnel or track. Peri-wound with some faint erythema that appears to be 2/2 pressure. No induration or heat. Wound bed appears to have more granulation tissue when compared to 7/18   Lab Results:  Recent Labs    12/11/20 1212  WBC 10.0  HGB 11.2*  HCT 36.0*  PLT 667*   BMET Recent Labs    12/11/20 1212  NA 138  K 4.3  CL 106  CO2 25  GLUCOSE 83  BUN 11  CREATININE 0.36*  CALCIUM 8.9   PT/INR No results for input(s): LABPROT, INR in the last 72 hours. CMP     Component Value Date/Time   NA 138 12/11/2020 1212   NA 141 04/15/2019 1109   K 4.3 12/11/2020 1212   CL 106 12/11/2020 1212   CO2 25 12/11/2020 1212   GLUCOSE 83 12/11/2020 1212   BUN 11 12/11/2020 1212   BUN 15 04/15/2019 1109   CREATININE 0.36 (L) 12/11/2020 1212   CREATININE 0.48 (L) 09/06/2019 0921   CALCIUM 8.9 12/11/2020 1212   PROT 6.7 12/11/2020 1212   PROT 6.9 06/04/2018 1440   ALBUMIN 2.5 (L) 12/11/2020 1212   ALBUMIN 3.7 06/04/2018 1440   AST 17 12/11/2020 1212   ALT 17 12/11/2020 1212   ALKPHOS 109 12/11/2020 1212   BILITOT 0.6 12/11/2020 1212   BILITOT <0.2 06/04/2018 1440   GFRNONAA >60 12/11/2020 1212   GFRNONAA 121 09/06/2019 0921   GFRAA >60 11/12/2019 1237   GFRAA 140 09/06/2019 0921   Lipase     Component Value Date/Time   LIPASE 22 11/25/2020 2145        Studies/Results: No results found.  Anti-infectives: Anti-infectives (From admission, onward)    Start     Dose/Rate Route Frequency Ordered Stop   12/11/20 2200  doxycycline (VIBRA-TABS) tablet 100 mg        100 mg Oral Every 12 hours 12/11/20 1441     12/11/20 2200  amoxicillin-clavulanate (AUGMENTIN) 875-125 MG per tablet 1 tablet        1 tablet Oral Every 12 hours 12/11/20 1441     12/08/20 1309  sodium chloride 0.9 % with ceFAZolin (ANCEF) ADS Med  Status:  Discontinued       Note to Pharmacy: Linard Millers   : cabinet override      12/08/20 1309 12/08/20 1324   12/08/20 1200  vancomycin (VANCOREADY) IVPB 750 mg/150 mL  Status:  Discontinued        750 mg 150 mL/hr over 60 Minutes Intravenous Every 12 hours 12/08/20 0803 12/11/20 1441   12/07/20 1200  vancomycin (VANCOREADY) IVPB 1250 mg/250 mL  Status:  Discontinued        1,250 mg 166.7 mL/hr over 90 Minutes Intravenous Every 12 hours 12/06/20 2354 12/08/20 0803   12/07/20 0000  cefTRIAXone (ROCEPHIN)  2 g in sodium chloride 0.9 % 100 mL IVPB  Status:  Discontinued        2 g 200 mL/hr over 30 Minutes Intravenous Every 24 hours 12/06/20 2258 12/11/20 1441   12/06/20 2345  vancomycin (VANCOCIN) IVPB 1000 mg/200 mL premix  Status:  Discontinued        1,000 mg 200 mL/hr over 60 Minutes Intravenous  Once 12/06/20 2258 12/06/20 2309   12/06/20 2345  vancomycin (VANCOREADY) IVPB 1250 mg/250 mL        1,250 mg 166.7 mL/hr over 90 Minutes Intravenous  Once 12/06/20 2251 12/07/20 0121   12/06/20 1530  ceFAZolin (ANCEF) IVPB 1 g/50 mL premix        1 g 100 mL/hr over 30 Minutes Intravenous  Once 12/06/20 1527 12/06/20 1730        Assessment/Plan POD 5 s/p excisional debridement of sacral wound - Dr. 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

## 2020-12-13 NOTE — Progress Notes (Signed)
PROGRESS NOTE  [name] AVW:[id number] DOB: 24-Mar-1961 DOA: 12/06/2020 PCP: [place], MD  HPI/Recap of past 76 hours: 60 year old male with past medical history of bipolar disorder, DVT, cerebral palsy, status post right BKA and quadriplegia with the exception of left lower extremity admitted on 7/13 for worsening sacral wound with increased pain.  Patient was admitted after work-up noted left gluteal cellulitis.  Patient started on IV antibiotics and underwent debridement on 7/15 by general surgery.  Surgical recommendations following were for hydrotherapy and twice daily wet-to-dry dressings.  Group home not able to care for patient's wound and he does not want to go back.  Skilled nursing facility placement search underway.  Assessment/Plan: Principal Problem:   Cellulitis of sacral region with secondary MRSA bacteremia: Status post surgical debridement 7/15.  Now on hydrotherapy plus twice daily wet-to-dry dressings. Active Problems:   Intellectual disability   Cerebral palsy, quadriplegic (HCC): Continue supportive treatment.   Hx of right BKA (HCC) Constipation: Stool softeners. Pressure injuries: Present on admission as follows  Pressure Injury Arm Left;Posterior;Proximal;Upper;Other (Comment) Stage II -  Partial thickness loss of dermis presenting as a shallow open ulcer with a red, pink wound bed without slough. pink open wound on armpit (Active)     Location: Arm  Location Orientation: Left;Posterior;Proximal;Upper;Other (Comment)  Staging: Stage II -  Partial thickness loss of dermis presenting as a shallow open ulcer with a red, pink wound bed without slough.  Wound Description (Comments): pink open wound on armpit  Present on Admission: Yes     Pressure Injury 07/31/20 Foot Left;Medial Stage 3 -  Full thickness tissue loss. Subcutaneous fat may be visible but bone, tendon or muscle are NOT exposed. (Active)  07/31/20 2152  Location: Foot  Location  Orientation: Left;Medial  Staging: Stage 3 -  Full thickness tissue loss. Subcutaneous fat may be visible but bone, tendon or muscle are NOT exposed.  Wound Description (Comments):   Present on Admission: Yes     Pressure Injury 08/01/20 Knee Left;Medial Stage 1 -  Intact skin with non-blanchable redness of a localized area usually over a bony prominence. (Active)  08/01/20   Location: Knee  Location Orientation: Left;Medial  Staging: Stage 1 -  Intact skin with non-blanchable redness of a localized area usually over a bony prominence.  Wound Description (Comments):   Present on Admission: Yes     Pressure Injury 08/01/20 Sacrum Right Unstageable - Full thickness tissue loss in which the base of the injury is covered by slough (yellow, tan, gray, green or brown) and/or eschar (tan, brown or black) in the wound bed. (Active)  08/01/20   Location: Sacrum  Location Orientation: Right  Staging: Unstageable - Full thickness tissue loss in which the base of the injury is covered by slough (yellow, tan, gray, green or brown) and/or eschar (tan, brown or black) in the wound bed.  Wound Description (Comments):   Present on Admission: Yes     Pressure Injury 10/06/20 Thigh Left;Lateral Stage 1 -  Intact skin with non-blanchable redness of a localized area usually over a bony prominence. Yellow wound bed (Active)  10/06/20 0800 (treatment was already in place.)  Location: Thigh  Location Orientation: Left;Lateral  Staging: Stage 1 -  Intact skin with non-blanchable redness of a localized area usually over a bony prominence.  Wound Description (Comments): Yellow wound bed  Present on Admission:      Pressure Injury 10/06/20 Thigh Right;Lateral Stage 1 -  Intact skin with non-blanchable  redness of a localized area usually over a bony prominence. red nonblanchable (Active)  10/06/20 0800  Location: Thigh  Location Orientation: Right;Lateral  Staging: Stage 1 -  Intact skin with non-blanchable  redness of a localized area usually over a bony prominence.  Wound Description (Comments): red nonblanchable  Present on Admission:      Pressure Injury 12/12/20 Sacrum Right Unstageable - Full thickness tissue loss in which the base of the injury is covered by slough (yellow, tan, gray, green or brown) and/or eschar (tan, brown or black) in the wound bed. PT only, wound on right sacral a (Active)  12/12/20   Location: Sacrum  Location Orientation: Right  Staging: Unstageable - Full thickness tissue loss in which the base of the injury is covered by slough (yellow, tan, gray, green or brown) and/or eschar (tan, brown or black) in the wound bed.  Wound Description (Comments): PT only, wound on right sacral area, painful to touch, slough in wound bed.  Present on Admission: Yes        Code Status: Full code  Family Communication: Left message for sister  Disposition Plan: Skilled nursing when bed available   Consultants: General surgery  Procedures: Status postdebridement 7/15  Antimicrobials: IV vancomycin and Rocephin 7/13-7/18 P.o. doxycycline and Augmentin 7/18-present  DVT prophylaxis: Lovenox  Level of care: Med-Surg   Objective: Vitals:   12/13/20 0633 12/13/20 1349  BP: 127/74 119/81  Pulse: 74 (!) 107  Resp: 17 19  Temp: 97.9 F (36.6 C) 98.4 F (36.9 C)  SpO2: 97% 97%    Intake/Output Summary (Last 24 hours) at 12/13/2020 1701 Last data filed at 12/13/2020 1223 Gross per 24 hour  Intake 940 ml  Output 1225 ml  Net -285 ml   Filed Weights   12/06/20 1148  Weight: 68 kg   Body mass index is 20.33 kg/m.  Exam:  General: Alert and oriented x2, no acute distress Cardiovascular: Regular rate and rhythm, S1-S2 Respiratory: Clear to auscultation bilaterally Abdomen: Soft, nontender, nondistended, positive bowel sounds Musculoskeletal: Status post right BKA Skin: Decubitus ulcer currently dressed   Data Reviewed: CBC: Recent Labs  Lab  12/07/20 0023 12/09/20 0417 12/11/20 1212  WBC 11.2* 10.8* 10.0  NEUTROABS  --  7.5  --   HGB 11.0* 11.0* 11.2*  HCT 34.9* 35.0* 36.0*  MCV 76.0* 75.8* 76.6*  PLT 543* 644* 667*   Basic Metabolic Panel: Recent Labs  Lab 12/07/20 0023 12/09/20 0417 12/11/20 1212  NA 136 136 138  K 4.2 3.9 4.3  CL 103 103 106  CO2 24 27 25   GLUCOSE 85 90 83  BUN 17 9 11   CREATININE 0.53* 0.39* 0.36*  CALCIUM 8.8* 8.5* 8.9   GFR: Estimated Creatinine Clearance: 94.4 mL/min (A) (by C-G formula based on SCr of 0.36 mg/dL (L)). Liver Function Tests: Recent Labs  Lab 12/09/20 0417 12/11/20 1212  AST 15 17  ALT 15 17  ALKPHOS 101 109  BILITOT 0.4 0.6  PROT 6.5 6.7  ALBUMIN 2.2* 2.5*   No results for input(s): LIPASE, AMYLASE in the last 168 hours. No results for input(s): AMMONIA in the last 168 hours. Coagulation Profile: No results for input(s): INR, PROTIME in the last 168 hours. Cardiac Enzymes: No results for input(s): CKTOTAL, CKMB, CKMBINDEX, TROPONINI in the last 168 hours. BNP (last 3 results) No results for input(s): PROBNP in the last 8760 hours. HbA1C: No results for input(s): HGBA1C in the last 72 hours. CBG: No results for input(s): GLUCAP  in the last 168 hours. Lipid Profile: No results for input(s): CHOL, HDL, LDLCALC, TRIG, CHOLHDL, LDLDIRECT in the last 72 hours. Thyroid Function Tests: No results for input(s): TSH, T4TOTAL, FREET4, T3FREE, THYROIDAB in the last 72 hours. Anemia Panel: No results for input(s): VITAMINB12, FOLATE, FERRITIN, TIBC, IRON, RETICCTPCT in the last 72 hours. Urine analysis:    Component Value Date/Time   COLORURINE YELLOW 11/25/2020 2145   APPEARANCEUR CLEAR 11/25/2020 2145   LABSPEC 1.023 11/25/2020 2145   PHURINE 6.0 11/25/2020 2145   GLUCOSEU >=500 (A) 11/25/2020 2145   HGBUR NEGATIVE 11/25/2020 2145   HGBUR negative 06/14/2010 1119   BILIRUBINUR NEGATIVE 11/25/2020 2145   BILIRUBINUR SMALL 01/04/2016 1634   KETONESUR 5 (A)  11/25/2020 2145   PROTEINUR NEGATIVE 11/25/2020 2145   UROBILINOGEN 2.0 (H) 02/05/2018 1825   NITRITE NEGATIVE 11/25/2020 2145   LEUKOCYTESUR NEGATIVE 11/25/2020 2145   Sepsis Labs: @LABRCNTIP (procalcitonin:4,lacticidven:4)  ) Recent Results (from the past 240 hour(s))  Resp Panel by RT-PCR (Flu A&B, Covid) Nasopharyngeal Swab     Status: None   Collection Time: 12/06/20  4:51 PM   Specimen: Nasopharyngeal Swab; Nasopharyngeal(NP) swabs in vial transport medium  Result Value Ref Range Status   SARS Coronavirus 2 by RT PCR NEGATIVE NEGATIVE Final    Comment: (NOTE) SARS-CoV-2 target nucleic acids are NOT DETECTED.  The SARS-CoV-2 RNA is generally detectable in upper respiratory specimens during the acute phase of infection. The lowest concentration of SARS-CoV-2 viral copies this assay can detect is 138 copies/mL. A negative result does not preclude SARS-Cov-2 infection and should not be used as the sole basis for treatment or other patient management decisions. A negative result may occur with  improper specimen collection/handling, submission of specimen other than nasopharyngeal swab, presence of viral mutation(s) within the areas targeted by this assay, and inadequate number of viral copies(<138 copies/mL). A negative result must be combined with clinical observations, patient history, and epidemiological information. The expected result is Negative.  Fact Sheet for Patients:  BloggerCourse.com  Fact Sheet for Healthcare Providers:  SeriousBroker.it  This test is no t yet approved or cleared by the [place] FDA and  has been authorized for detection and/or diagnosis of SARS-CoV-2 by FDA under an Emergency Use Authorization (EUA). This EUA will remain  in effect (meaning this test can be used) for the duration of the COVID-19 declaration under Section 564(b)(1) of the Act, 21 U.S.C.section 360bbb-3(b)(1), unless the  authorization is terminated  or revoked sooner.       Influenza A by PCR NEGATIVE NEGATIVE Final   Influenza B by PCR NEGATIVE NEGATIVE Final    Comment: (NOTE) The Xpert Xpress SARS-CoV-2/FLU/RSV plus assay is intended as an aid in the diagnosis of influenza from Nasopharyngeal swab specimens and should not be used as a sole basis for treatment. Nasal washings and aspirates are unacceptable for Xpert Xpress SARS-CoV-2/FLU/RSV testing.  Fact Sheet for Patients: BloggerCourse.com  Fact Sheet for Healthcare Providers: SeriousBroker.it  This test is not yet approved or cleared by the [place] FDA and has been authorized for detection and/or diagnosis of SARS-CoV-2 by FDA under an Emergency Use Authorization (EUA). This EUA will remain in effect (meaning this test can be used) for the duration of the COVID-19 declaration under Section 564(b)(1) of the Act, 21 U.S.C. section 360bbb-3(b)(1), unless the authorization is terminated or revoked.  Performed at [facility], [address], [place], [place] [address]   Culture, blood (routine x 2)  Status: None   Collection Time: 12/06/20  4:51 PM   Specimen: BLOOD  Result Value Ref Range Status   Specimen Description   Final    BLOOD BLOOD LEFT HAND Performed at [facility], [address], [place], [place] [address]    Special Requests   Final    BOTTLES DRAWN AEROBIC AND ANAEROBIC Blood Culture adequate volume Performed at [facility], [address] [place], [place] [address]    Culture   Final    NO GROWTH 5 DAYS Performed at [facility], [address] [place], [place] [address]    Report Status 12/11/2020 FINAL  Final  Culture, blood (routine x 2)     Status: Abnormal   Collection Time: 12/07/20 12:23 AM   Specimen: Left Antecubital; Blood  Result Value Ref Range Status   Specimen Description    Final    LEFT ANTECUBITAL Performed at [facility], [address], [place], [place] [address]    Special Requests   Final    Blood Culture adequate volume BOTTLES DRAWN AEROBIC ONLY   Culture  Setup Time   Final    GRAM POSITIVE COCCI IN CLUSTERS AEROBIC BOTTLE ONLY CRITICAL RESULT CALLED TO, READ BACK BY AND VERIFIED WITH: PHARMD C.SHEAD at 0820 on 12/08/2020 by [email or web address].    Culture (A)  Final    STAPHYLOCOCCUS HOMINIS THE SIGNIFICANCE OF ISOLATING THIS ORGANISM FROM A SINGLE SET OF BLOOD CULTURES WHEN MULTIPLE SETS ARE DRAWN IS UNCERTAIN. PLEASE NOTIFY THE MICROBIOLOGY DEPARTMENT WITHIN ONE WEEK IF SPECIATION AND SENSITIVITIES ARE REQUIRED. Performed at [facility], [address] [place], [place] [address]    Report Status 12/09/2020 FINAL  Final  Blood Culture ID Panel (Reflexed)     Status: Abnormal   Collection Time: 12/07/20 12:23 AM  Result Value Ref Range Status   Enterococcus faecalis NOT DETECTED NOT DETECTED Final   Enterococcus Faecium NOT DETECTED NOT DETECTED Final   Listeria monocytogenes NOT DETECTED NOT DETECTED Final   Staphylococcus species DETECTED (A) NOT DETECTED Final    Comment: CRITICAL RESULT CALLED TO, READ BACK BY AND VERIFIED WITH: PHARMD C.SHED AT 0820 ON 12/08/2020 BY T.SAAD.    Staphylococcus aureus (BCID) NOT DETECTED NOT DETECTED Final   Staphylococcus epidermidis NOT DETECTED NOT DETECTED Final   Staphylococcus lugdunensis NOT DETECTED NOT DETECTED Final   Streptococcus species NOT DETECTED NOT DETECTED Final   Streptococcus agalactiae NOT DETECTED NOT DETECTED Final   Streptococcus pneumoniae NOT DETECTED NOT DETECTED Final   Streptococcus pyogenes NOT DETECTED NOT DETECTED Final   A.calcoaceticus-baumannii NOT DETECTED NOT DETECTED Final   Bacteroides fragilis NOT DETECTED NOT DETECTED Final   Enterobacterales NOT DETECTED NOT DETECTED Final   Enterobacter cloacae complex NOT DETECTED NOT DETECTED Final    Escherichia coli NOT DETECTED NOT DETECTED Final   Klebsiella aerogenes NOT DETECTED NOT DETECTED Final   Klebsiella oxytoca NOT DETECTED NOT DETECTED Final   Klebsiella pneumoniae NOT DETECTED NOT DETECTED Final   Proteus species NOT DETECTED NOT DETECTED Final   Salmonella species NOT DETECTED NOT DETECTED Final   Serratia marcescens NOT DETECTED NOT DETECTED Final   Haemophilus influenzae NOT DETECTED NOT DETECTED Final   Neisseria meningitidis NOT DETECTED NOT DETECTED Final   Pseudomonas aeruginosa NOT DETECTED NOT DETECTED Final   Stenotrophomonas maltophilia NOT DETECTED NOT DETECTED Final   Candida albicans NOT DETECTED NOT DETECTED Final   Candida auris NOT DETECTED NOT DETECTED Final  Candida glabrata NOT DETECTED NOT DETECTED Final   Candida krusei NOT DETECTED NOT DETECTED Final   Candida parapsilosis NOT DETECTED NOT DETECTED Final   Candida tropicalis NOT DETECTED NOT DETECTED Final   Cryptococcus neoformans/gattii NOT DETECTED NOT DETECTED Final    Comment: Performed at [facility], [address] [place], [place] [address]  MRSA Next Gen by PCR, Nasal     Status: Abnormal   Collection Time: 12/07/20  9:23 AM   Specimen: Nasal Mucosa; Nasal Swab  Result Value Ref Range Status   MRSA by PCR Next Gen DETECTED (A) NOT DETECTED Final    Comment: RESULT CALLED TO, READ BACK BY AND VERIFIED WITH: K BLOOM AT 1852 ON 12/07/2020 BY MOSLEY,J  (NOTE) The GeneXpert MRSA Assay (FDA approved for NASAL specimens only), is one component of a comprehensive MRSA colonization surveillance program. It is not intended to diagnose MRSA infection nor to guide or monitor treatment for MRSA infections. Test performance is not FDA approved in patients less than 42 years old. Performed at [facility], [address] [place], [place] [address]       Studies: No results found.  Scheduled Meds:  amoxicillin-clavulanate  1 tablet Oral Q12H   baclofen  20 mg  Oral TID   collagenase  1 application Topical Daily   diclofenac Sodium  4 g Topical QID   divalproex  500 mg Oral 2 times per day   doxycycline  100 mg Oral Q12H   enoxaparin (LOVENOX) injection  40 mg Subcutaneous Q24H   feeding supplement  237 mL Oral BID BM   fluticasone  1 spray Each Nare Daily   hydrocerin   Topical BID   iron polysaccharides  150 mg Oral Q1200   loratadine  10 mg Oral Daily   mupirocin ointment   Nasal BID   tretinoin  1 application Topical See admin instructions    Continuous Infusions:   LOS: 7 days     Hollice Espy, MD [facility]   12/13/2020, 5:01 PM

## 2020-12-13 NOTE — TOC Progression Note (Addendum)
Transition of Care [facility]) - Progression Note   Patient Details  Name: AKEEN LEDYARD MRN: [id number] Date of Birth: 1960-09-17  Transition of Care [facility]) CM/SW Contact  Ewing Schlein, LCSW Phone Number: 12/13/2020, 2:49 PM  Clinical Narrative: [facility] is patient's only bed offer. CSW updated sister/POA regarding bed offer. Admissions at [facility] is reviewing patient and bed availability. TOC awaiting update from [place].   Requested documents uploaded to [facility].  Barriers to Discharge: Continued Medical Work up  Readmission Risk Interventions Readmission Risk Prevention Plan 08/03/2020  Post Dischage Appt Complete  Medication Screening Complete  Some recent data might be hidden

## 2020-12-14 DIAGNOSIS — Z89511 Acquired absence of right leg below knee: Secondary | ICD-10-CM

## 2020-12-14 MED ORDER — AMOXICILLIN-POT CLAVULANATE 400-57 MG/5ML PO SUSR
875.0000 mg | Freq: Two times a day (BID) | ORAL | Status: DC
  Administered 2020-12-15 (×2): 875 mg via ORAL
  Filled 2020-12-14 (×9): qty 10.9

## 2020-12-14 MED ORDER — DOXYCYCLINE MONOHYDRATE 25 MG/5ML PO SUSR
100.0000 mg | Freq: Two times a day (BID) | ORAL | Status: DC
  Administered 2020-12-14 – 2020-12-15 (×3): 100 mg via ORAL
  Filled 2020-12-14 (×4): qty 20

## 2020-12-14 NOTE — Progress Notes (Signed)
   12/14/20 1300  Hydrotherapy note 1130-1200  Subjective Assessment  Subjective I am going to take that gun and throw it away.  Patient and Family Stated Goals agreed to  hydro treatment  Date of Onset  (present)  Prior Treatments dressing changes at group home  Evaluation and Treatment  Evaluation and Treatment Procedures Explained to Patient/Family Yes  Pressure Injury 12/12/20 Sacrum Right Unstageable - Full thickness tissue loss in which the base of the injury is covered by slough (yellow, tan, gray, green or brown) and/or eschar (tan, brown or black) in the wound bed. PT only, wound on right sacral a  Date First Assessed: 12/12/20   Location: Sacrum  Location Orientation: Right  Staging: Unstageable - Full thickness tissue loss in which the base of the injury is covered by slough (yellow, tan, gray, green or brown) and/or eschar (tan, brown or blac...  Dressing Type ABD;Moist to dry;Santyl  Dressing Changed  Dressing Change Frequency Twice a day  State of Healing Early/partial granulation  % Wound base Red or Granulating 10%  % Wound base Yellow/Fibrinous Exudate 90%  Peri-wound Assessment Intact;Erythema (blanchable)  Wound Length (cm) 4 cm  Wound Width (cm) 4 cm  Wound Depth (cm) 1 cm  Wound Surface Area (cm^2) 16 cm^2  Wound Volume (cm^3) 16 cm^3  Undermining (cm) 2  Margins Unattached edges (unapproximated)  Drainage Amount Moderate (dressing from 24 hours on  patient)  Drainage Description Green;Purulent;Odor  Treatment Hydrotherapy (Pulse lavage);Packing (Saline gauze)  Hydrotherapy  Pulsed Lavage with Suction (psi) 12 psi  Pulsed Lavage with Suction - Normal Saline Used 1000 mL  Pulsed Lavage Tip Tip with splash shield  Pulsed lavage therapy - wound location sacrum  Selective Debridement  Selective Debridement - Location left sacrum  Selective Debridement - Tools Used Forceps;Scissors  Selective Debridement - Tissue Removed slough, very little  Wound Therapy -  Assess/Plan/Recommendations  Wound Therapy - Clinical Statement patient tolerated  PLS with dressing, does report pain at times. . Very Little slough debrided. Very small amount of granulation  tissue in wound bed.  Wound Therapy - Functional Problem List quadriplegic , contractures, incontinence, bed bound.  Factors Delaying/Impairing Wound Healing Incontinence;Immobility  Hydrotherapy Plan Debridement;Dressing change;Patient/family education  Wound Therapy - Frequency 6X / week  Wound Therapy - Current Recommendations Case manager/social work  Wound Therapy - Follow Up Recommendations dressing changes by RN  Wound Therapy Goals - Improve the function of patient's integumentary system by progressing the wound(s) through the phases of wound healing by:  Decrease Necrotic Tissue to 50  Decrease Necrotic Tissue - Progress Progressing toward goal  Increase Granulation Tissue to 50  Increase Granulation Tissue - Progress Progressing toward goal  Improve Drainage Characteristics Min  Improve Drainage Characteristics - Progress Progressing toward goal  Patient/Family will be able to  direct repositioning  Patient/Family Instruction Goal - Progress Progressing toward goal  Goals/treatment plan/discharge plan were made with and agreed upon by patient/family Yes  Time For Goal Achievement 2 weeks  Wound Therapy - Potential for Goals Fair  [name] PT Acute Rehabilitation Services Pager [phone] Office [phone]' Deb, dr gun and tip

## 2020-12-14 NOTE — Care Management Important Message (Signed)
Important Message  Patient Details IM Letter given to the Patient. Name: [name] MRN: [id number] Date of Birth: 04-24-1961   Medicare Important Message Given:  Yes     Caren Macadam 12/14/2020, 1:32 PM

## 2020-12-14 NOTE — TOC Progression Note (Addendum)
Transition of Care [facility]) - Progression Note   Patient Details  Name: [name] MRN: [id number] Date of Birth: [birth year]  [facility] CM/SW Contact  Ewing Schlein, LCSW Phone Number: 12/14/2020, 12:03 PM  Clinical Narrative: PASRR # received: [id number] E. CSW spoke with [name] at [facility]. Per [name], as there is a COVID outbreak at the facility the patient would not able to be admitted until 12/18/20. CSW expanded bed search to [place]. CSW left VM for patient's sister/POA with an update regarding the SNF situation. Hospitalist updated. TOC awaiting additional bed offers.  Addendum: [facility] made a bed offer, but will need patient's COVID vaccine status. CSW made two attempts to reach patient's sister/POA, but was unable to reach her so VM was left requesting call back. Patient's sister-in-law, [name], is assisting CSW with contacting the POA.  CSW received call from sister. Patient has been vaccinated and boosted x1 for COVID. Tanya with  [facility] to notify CSW about bed availability.  Expected Discharge Plan: Skilled Nursing Facility Barriers to Discharge: Continued Medical Work up  Expected Discharge Plan and Services Expected Discharge Plan: Skilled Nursing Facility In-house Referral: Clinical Social Work Post Acute Care Choice: Skilled Nursing Facility Living arrangements for the past 2 months: Group Home            DME Arranged: N/A DME Agency: NA  Readmission Risk Interventions Readmission Risk Prevention Plan 08/03/2020  Post Dischage Appt Complete  Medication Screening Complete  Some recent data might be hidden

## 2020-12-14 NOTE — Progress Notes (Signed)
PROGRESS NOTE  [name] BZJ:[id number] DOB: Aug 21, 1960 DOA: 12/06/2020 PCP: [place], MD  HPI/Recap of past 37 hours: 60 year old male with past medical history of bipolar disorder, DVT, cerebral palsy, status post right BKA and quadriplegia with the exception of left lower extremity admitted on 7/13 for worsening sacral wound with increased pain.  Patient was admitted after work-up noted left gluteal cellulitis.  Patient started on IV antibiotics and underwent debridement on 7/15 by general surgery.  Surgical recommendations following were for hydrotherapy and twice daily wet-to-dry dressings.  Group home not able to care for patient's wound and he does not want to go back.  Skilled nursing facility that patient was set to go to apparently has COVID outbreak and would not be able to take him at least until Monday.  Assessment/Plan: Principal Problem:   Cellulitis of sacral region with secondary MRSA bacteremia: Status post surgical debridement 7/15.  Now on hydrotherapy plus twice daily wet-to-dry dressings. Active Problems:   Intellectual disability   Cerebral palsy, quadriplegic (HCC): Continue supportive treatment.   Hx of right BKA (HCC) Constipation: Stool softeners. Pressure injuries: Present on admission as follows  Pressure Injury Arm Left;Posterior;Proximal;Upper;Other (Comment) Stage II -  Partial thickness loss of dermis presenting as a shallow open ulcer with a red, pink wound bed without slough. pink open wound on armpit (Active)     Location: Arm  Location Orientation: Left;Posterior;Proximal;Upper;Other (Comment)  Staging: Stage II -  Partial thickness loss of dermis presenting as a shallow open ulcer with a red, pink wound bed without slough.  Wound Description (Comments): pink open wound on armpit  Present on Admission: Yes     Pressure Injury 07/31/20 Foot Left;Medial Stage 3 -  Full thickness tissue loss. Subcutaneous fat may be visible but bone,  tendon or muscle are NOT exposed. (Active)  07/31/20 2152  Location: Foot  Location Orientation: Left;Medial  Staging: Stage 3 -  Full thickness tissue loss. Subcutaneous fat may be visible but bone, tendon or muscle are NOT exposed.  Wound Description (Comments):   Present on Admission: Yes     Pressure Injury 08/01/20 Knee Left;Medial Stage 1 -  Intact skin with non-blanchable redness of a localized area usually over a bony prominence. (Active)  08/01/20   Location: Knee  Location Orientation: Left;Medial  Staging: Stage 1 -  Intact skin with non-blanchable redness of a localized area usually over a bony prominence.  Wound Description (Comments):   Present on Admission: Yes     Pressure Injury 08/01/20 Sacrum Right Unstageable - Full thickness tissue loss in which the base of the injury is covered by slough (yellow, tan, gray, green or brown) and/or eschar (tan, brown or black) in the wound bed. (Active)  08/01/20   Location: Sacrum  Location Orientation: Right  Staging: Unstageable - Full thickness tissue loss in which the base of the injury is covered by slough (yellow, tan, gray, green or brown) and/or eschar (tan, brown or black) in the wound bed.  Wound Description (Comments):   Present on Admission: Yes     Pressure Injury 10/06/20 Thigh Left;Lateral Stage 1 -  Intact skin with non-blanchable redness of a localized area usually over a bony prominence. Yellow wound bed (Active)  10/06/20 0800 (treatment was already in place.)  Location: Thigh  Location Orientation: Left;Lateral  Staging: Stage 1 -  Intact skin with non-blanchable redness of a localized area usually over a bony prominence.  Wound Description (Comments): Yellow wound bed  Present  on Admission:      Pressure Injury 10/06/20 Thigh Right;Lateral Stage 1 -  Intact skin with non-blanchable redness of a localized area usually over a bony prominence. red nonblanchable (Active)  10/06/20 0800  Location: Thigh   Location Orientation: Right;Lateral  Staging: Stage 1 -  Intact skin with non-blanchable redness of a localized area usually over a bony prominence.  Wound Description (Comments): red nonblanchable  Present on Admission:      Pressure Injury 12/12/20 Sacrum Right Unstageable - Full thickness tissue loss in which the base of the injury is covered by slough (yellow, tan, gray, green or brown) and/or eschar (tan, brown or black) in the wound bed. PT only, wound on right sacral a (Active)  12/12/20   Location: Sacrum  Location Orientation: Right  Staging: Unstageable - Full thickness tissue loss in which the base of the injury is covered by slough (yellow, tan, gray, green or brown) and/or eschar (tan, brown or black) in the wound bed.  Wound Description (Comments): PT only, wound on right sacral area, painful to touch, slough in wound bed.  Present on Admission: Yes        Code Status: Full code  Family Communication: Left message for sister  Disposition Plan: Skilled nursing when bed available   Consultants: General surgery  Procedures: Status postdebridement 7/15  Antimicrobials: IV vancomycin and Rocephin 7/13-7/18 P.o. doxycycline and Augmentin 7/18-present  DVT prophylaxis: Lovenox  Level of care: Med-Surg   Objective: Vitals:   12/14/20 0704 12/14/20 1352  BP: 121/65 113/78  Pulse: 74 96  Resp: 18 18  Temp: 98 F (36.7 C) 98.2 F (36.8 C)  SpO2: 97% 99%    Intake/Output Summary (Last 24 hours) at 12/14/2020 1406 Last data filed at 12/14/2020 1209 Gross per 24 hour  Intake 1314 ml  Output 500 ml  Net 814 ml    Filed Weights   12/06/20 1148  Weight: 68 kg   Body mass index is 20.33 kg/m.  Exam: Unchanged from previous day General: Alert and oriented x2, no acute distress Cardiovascular: Regular rate and rhythm, S1-S2 Respiratory: Clear to auscultation bilaterally Abdomen: Soft, nontender, nondistended, positive bowel sounds Musculoskeletal:  Status post right BKA Skin: Decubitus ulcer currently dressed   Data Reviewed: CBC: Recent Labs  Lab 12/09/20 0417 12/11/20 1212  WBC 10.8* 10.0  NEUTROABS 7.5  --   HGB 11.0* 11.2*  HCT 35.0* 36.0*  MCV 75.8* 76.6*  PLT 644* 667*    Basic Metabolic Panel: Recent Labs  Lab 12/09/20 0417 12/11/20 1212  NA 136 138  K 3.9 4.3  CL 103 106  CO2 27 25  GLUCOSE 90 83  BUN 9 11  CREATININE 0.39* 0.36*  CALCIUM 8.5* 8.9    GFR: Estimated Creatinine Clearance: 94.4 mL/min (A) (by C-G formula based on SCr of 0.36 mg/dL (L)). Liver Function Tests: Recent Labs  Lab 12/09/20 0417 12/11/20 1212  AST 15 17  ALT 15 17  ALKPHOS 101 109  BILITOT 0.4 0.6  PROT 6.5 6.7  ALBUMIN 2.2* 2.5*    No results for input(s): LIPASE, AMYLASE in the last 168 hours. No results for input(s): AMMONIA in the last 168 hours. Coagulation Profile: No results for input(s): INR, PROTIME in the last 168 hours. Cardiac Enzymes: No results for input(s): CKTOTAL, CKMB, CKMBINDEX, TROPONINI in the last 168 hours. BNP (last 3 results) No results for input(s): PROBNP in the last 8760 hours. HbA1C: No results for input(s): HGBA1C in the last 72 hours.  CBG: No results for input(s): GLUCAP in the last 168 hours. Lipid Profile: No results for input(s): CHOL, HDL, LDLCALC, TRIG, CHOLHDL, LDLDIRECT in the last 72 hours. Thyroid Function Tests: No results for input(s): TSH, T4TOTAL, FREET4, T3FREE, THYROIDAB in the last 72 hours. Anemia Panel: No results for input(s): VITAMINB12, FOLATE, FERRITIN, TIBC, IRON, RETICCTPCT in the last 72 hours. Urine analysis:    Component Value Date/Time   COLORURINE YELLOW 11/25/2020 2145   APPEARANCEUR CLEAR 11/25/2020 2145   LABSPEC 1.023 11/25/2020 2145   PHURINE 6.0 11/25/2020 2145   GLUCOSEU >=500 (A) 11/25/2020 2145   HGBUR NEGATIVE 11/25/2020 2145   HGBUR negative 06/14/2010 1119   BILIRUBINUR NEGATIVE 11/25/2020 2145   BILIRUBINUR SMALL 01/04/2016 1634    KETONESUR 5 (A) 11/25/2020 2145   PROTEINUR NEGATIVE 11/25/2020 2145   UROBILINOGEN 2.0 (H) 02/05/2018 1825   NITRITE NEGATIVE 11/25/2020 2145   LEUKOCYTESUR NEGATIVE 11/25/2020 2145   Sepsis Labs: @LABRCNTIP (procalcitonin:4,lacticidven:4)  ) Recent Results (from the past 240 hour(s))  Resp Panel by RT-PCR (Flu A&B, Covid) Nasopharyngeal Swab     Status: None   Collection Time: 12/06/20  4:51 PM   Specimen: Nasopharyngeal Swab; Nasopharyngeal(NP) swabs in vial transport medium  Result Value Ref Range Status   SARS Coronavirus 2 by RT PCR NEGATIVE NEGATIVE Final    Comment: (NOTE) SARS-CoV-2 target nucleic acids are NOT DETECTED.  The SARS-CoV-2 RNA is generally detectable in upper respiratory specimens during the acute phase of infection. The lowest concentration of SARS-CoV-2 viral copies this assay can detect is 138 copies/mL. A negative result does not preclude SARS-Cov-2 infection and should not be used as the sole basis for treatment or other patient management decisions. A negative result may occur with  improper specimen collection/handling, submission of specimen other than nasopharyngeal swab, presence of viral mutation(s) within the areas targeted by this assay, and inadequate number of viral copies(<138 copies/mL). A negative result must be combined with clinical observations, patient history, and epidemiological information. The expected result is Negative.  Fact Sheet for Patients:  BloggerCourse.comhttps://www.fda.gov/media/152166/download  Fact Sheet for Healthcare Providers:  SeriousBroker.ithttps://www.fda.gov/media/152162/download  This test is no t yet approved or cleared by the [place] FDA and  has been authorized for detection and/or diagnosis of SARS-CoV-2 by FDA under an Emergency Use Authorization (EUA). This EUA will remain  in effect (meaning this test can be used) for the duration of the COVID-19 declaration under Section 564(b)(1) of the Act, 21 U.S.C.section  360bbb-3(b)(1), unless the authorization is terminated  or revoked sooner.       Influenza A by PCR NEGATIVE NEGATIVE Final   Influenza B by PCR NEGATIVE NEGATIVE Final    Comment: (NOTE) The Xpert Xpress SARS-CoV-2/FLU/RSV plus assay is intended as an aid in the diagnosis of influenza from Nasopharyngeal swab specimens and should not be used as a sole basis for treatment. Nasal washings and aspirates are unacceptable for Xpert Xpress SARS-CoV-2/FLU/RSV testing.  Fact Sheet for Patients: BloggerCourse.comhttps://www.fda.gov/media/152166/download  Fact Sheet for Healthcare Providers: SeriousBroker.ithttps://www.fda.gov/media/152162/download  This test is not yet approved or cleared by the [place] FDA and has been authorized for detection and/or diagnosis of SARS-CoV-2 by FDA under an Emergency Use Authorization (EUA). This EUA will remain in effect (meaning this test can be used) for the duration of the COVID-19 declaration under Section 564(b)(1) of the Act, 21 U.S.C. section 360bbb-3(b)(1), unless the authorization is terminated or revoked.  Performed at [facility], [address] [place], [place] [address]   Culture,  blood (routine x 2)     Status: None   Collection Time: 12/06/20  4:51 PM   Specimen: BLOOD  Result Value Ref Range Status   Specimen Description   Final    BLOOD BLOOD LEFT HAND Performed at [facility], [address], [place], [place] [address]    Special Requests   Final    BOTTLES DRAWN AEROBIC AND ANAEROBIC Blood Culture adequate volume Performed at [facility], [address], [place], [place] [address]    Culture   Final    NO GROWTH 5 DAYS Performed at [facility], [address] [place], [place] [address]    Report Status 12/11/2020 FINAL  Final  Culture, blood (routine x 2)     Status: Abnormal   Collection Time: 12/07/20 12:23 AM   Specimen: Left Antecubital; Blood  Result Value Ref Range Status    Specimen Description   Final    LEFT ANTECUBITAL Performed at [facility], [address], [place], [place] [address]    Special Requests   Final    Blood Culture adequate volume BOTTLES DRAWN AEROBIC ONLY   Culture  Setup Time   Final    GRAM POSITIVE COCCI IN CLUSTERS AEROBIC BOTTLE ONLY CRITICAL RESULT CALLED TO, READ BACK BY AND VERIFIED WITH: PHARMD C.SHEAD at 0820 on 12/08/2020 by t.[email or web address].    Culture (A)  Final    STAPHYLOCOCCUS HOMINIS THE SIGNIFICANCE OF ISOLATING THIS ORGANISM FROM A SINGLE SET OF BLOOD CULTURES WHEN MULTIPLE SETS ARE DRAWN IS UNCERTAIN. PLEASE NOTIFY THE MICROBIOLOGY DEPARTMENT WITHIN ONE WEEK IF SPECIATION AND SENSITIVITIES ARE REQUIRED. Performed at [facility], [address] [place], [place] [address]    Report Status 12/09/2020 FINAL  Final  Blood Culture ID Panel (Reflexed)     Status: Abnormal   Collection Time: 12/07/20 12:23 AM  Result Value Ref Range Status   Enterococcus faecalis NOT DETECTED NOT DETECTED Final   Enterococcus Faecium NOT DETECTED NOT DETECTED Final   Listeria monocytogenes NOT DETECTED NOT DETECTED Final   Staphylococcus species DETECTED (A) NOT DETECTED Final    Comment: CRITICAL RESULT CALLED TO, READ BACK BY AND VERIFIED WITH: PHARMD C.SHED AT 0820 ON 12/08/2020 BY T.SAAD.    Staphylococcus aureus (BCID) NOT DETECTED NOT DETECTED Final   Staphylococcus epidermidis NOT DETECTED NOT DETECTED Final   Staphylococcus lugdunensis NOT DETECTED NOT DETECTED Final   Streptococcus species NOT DETECTED NOT DETECTED Final   Streptococcus agalactiae NOT DETECTED NOT DETECTED Final   Streptococcus pneumoniae NOT DETECTED NOT DETECTED Final   Streptococcus pyogenes NOT DETECTED NOT DETECTED Final   A.calcoaceticus-baumannii NOT DETECTED NOT DETECTED Final   Bacteroides fragilis NOT DETECTED NOT DETECTED Final   Enterobacterales NOT DETECTED NOT DETECTED Final   Enterobacter cloacae complex NOT DETECTED NOT  DETECTED Final   Escherichia coli NOT DETECTED NOT DETECTED Final   Klebsiella aerogenes NOT DETECTED NOT DETECTED Final   Klebsiella oxytoca NOT DETECTED NOT DETECTED Final   Klebsiella pneumoniae NOT DETECTED NOT DETECTED Final   Proteus species NOT DETECTED NOT DETECTED Final   Salmonella species NOT DETECTED NOT DETECTED Final   Serratia marcescens NOT DETECTED NOT DETECTED Final   Haemophilus influenzae NOT DETECTED NOT DETECTED Final   Neisseria meningitidis NOT DETECTED NOT DETECTED Final   Pseudomonas aeruginosa NOT DETECTED NOT DETECTED Final   Stenotrophomonas maltophilia NOT DETECTED NOT DETECTED Final   Candida albicans NOT DETECTED NOT DETECTED Final  Candida auris NOT DETECTED NOT DETECTED Final   Candida glabrata NOT DETECTED NOT DETECTED Final   Candida krusei NOT DETECTED NOT DETECTED Final   Candida parapsilosis NOT DETECTED NOT DETECTED Final   Candida tropicalis NOT DETECTED NOT DETECTED Final   Cryptococcus neoformans/gattii NOT DETECTED NOT DETECTED Final    Comment: Performed at [facility], [address] [place], [place] [address]  MRSA Next Gen by PCR, Nasal     Status: Abnormal   Collection Time: 12/07/20  9:23 AM   Specimen: Nasal Mucosa; Nasal Swab  Result Value Ref Range Status   MRSA by PCR Next Gen DETECTED (A) NOT DETECTED Final    Comment: RESULT CALLED TO, READ BACK BY AND VERIFIED WITH: K BLOOM AT 1852 ON 12/07/2020 BY MOSLEY,J  (NOTE) The GeneXpert MRSA Assay (FDA approved for NASAL specimens only), is one component of a comprehensive MRSA colonization surveillance program. It is not intended to diagnose MRSA infection nor to guide or monitor treatment for MRSA infections. Test performance is not FDA approved in patients less than 38 years old. Performed at [facility], [address], [place], [place] [address]       Studies: No results found.  Scheduled Meds:  amoxicillin-clavulanate  1 tablet Oral Q12H    baclofen  20 mg Oral TID   collagenase  1 application Topical Daily   diclofenac Sodium  4 g Topical QID   divalproex  500 mg Oral 2 times per day   doxycycline  100 mg Oral Q12H   enoxaparin (LOVENOX) injection  40 mg Subcutaneous Q24H   feeding supplement  237 mL Oral BID BM   fluticasone  1 spray Each Nare Daily   iron polysaccharides  150 mg Oral Q1200   lactulose  10 g Oral Daily   loratadine  10 mg Oral Daily   mupirocin ointment   Nasal BID   tretinoin  1 application Topical See admin instructions    Continuous Infusions:   LOS: 8 days     Hollice Espy, MD [facility]   12/14/2020, 2:06 PM

## 2020-12-15 DIAGNOSIS — L89154 Pressure ulcer of sacral region, stage 4: Secondary | ICD-10-CM

## 2020-12-15 LAB — RESP PANEL BY RT-PCR (FLU A&B, COVID) ARPGX2
Influenza A by PCR: NEGATIVE
Influenza B by PCR: NEGATIVE
SARS Coronavirus 2 by RT PCR: NEGATIVE

## 2020-12-15 MED ORDER — LACTULOSE 10 GM/15ML PO SOLN
10.0000 g | Freq: Every day | ORAL | 0 refills | Status: AC
Start: 2020-12-16 — End: ?

## 2020-12-15 MED ORDER — DICLOFENAC SODIUM 1 % EX GEL: 4.0000 g | CUTANEOUS | Status: AC

## 2020-12-15 MED ORDER — ZINC OXIDE 40 % EX OINT
1.0000 | TOPICAL_OINTMENT | Freq: Two times a day (BID) | CUTANEOUS | 0 refills | Status: AC | PRN
Start: 2020-12-15 — End: ?

## 2020-12-15 MED ORDER — BISACODYL 5 MG PO TBEC: 5.0000 mg | DELAYED_RELEASE_TABLET | Freq: Every day | ORAL | 0 refills | Status: AC | PRN

## 2020-12-15 MED ORDER — HYDROCODONE-ACETAMINOPHEN 5-325 MG PO TABS: 1.0000 | ORAL_TABLET | ORAL | 0 refills | Status: DC | PRN

## 2020-12-15 NOTE — Progress Notes (Addendum)
PT HYDROTHERAPY TX NOTE AND D/C   12/15/20 1200  Subjective Assessment  Subjective I am going to take that gun and throw it away.  Patient and Family Stated Goals agreed to  hydro treatment  Date of Onset 12/15/20  Seen with CCS today mild erythema, no tunnels or induration, beginning to granulate. OK to d/c hydro after today's tx per surgical team  Prior Treatments dressing changes at group home  Evaluation and Treatment  Evaluation and Treatment Procedures Explained to Patient/Family Yes  Evaluation and Treatment Procedures agreed to  Pressure Injury 12/12/20 Sacrum Right Unstageable - Full thickness tissue loss in which the base of the injury is covered by slough (yellow, tan, gray, green or brown) and/or eschar (tan, brown or black) in the wound bed. PT only, wound on right sacral a  Date First Assessed: 12/12/20   Location: Sacrum  Location Orientation: Right  Staging: Unstageable - Full thickness tissue loss in which the base of the injury is covered by slough (yellow, tan, gray, green or brown) and/or eschar (tan, brown or blac...  Wound Image  (see CCS notes)  Dressing Type Moist to dry;Gauze (Comment) (santyl)  Dressing Changed  State of Healing Early/partial granulation  Site / Wound Assessment Pink;Yellow  % Wound base Red or Granulating  (clean, pink, very few true granulation buds)  % Wound base Yellow/Fibrinous Exudate 70%  % Wound base Black/Eschar 0%  Peri-wound Assessment Erythema (blanchable)  Wound Length (cm)  (see eval)  Margins Attached edges (approximated)  Drainage Amount Minimal  Drainage Description Serosanguineous;No odor  Treatment Cleansed;Hydrotherapy (Pulse lavage);Packing (Saline gauze)  Hydrotherapy  Pulsed Lavage with Suction (psi) 12 psi  Pulsed Lavage with Suction - Normal Saline Used 1000 mL  Pulsed Lavage Tip Tip with splash shield  Pulsed lavage therapy - wound location sacrum  Selective Debridement  Selective Debridement - Location left  sacrum  Wound Therapy - Assess/Plan/Recommendations  Wound Therapy - Clinical Statement patient tolerated  PLS with dressing, does report pain at times, pt was premedicated.   Very small amount of granulation  tissue in wound bed. wound with decr necrotic tissue. seen with CCS, ok to d/c hydro after today. PT will sign off  Wound Therapy - Functional Problem List quadriplegic , contractures, incontinence, bed bound.  Wound Therapy Goals - Improve the function of patient's integumentary system by progressing the wound(s) through the phases of wound healing by:  Decrease Necrotic Tissue - Progress Partly met  Increase Granulation Tissue - Progress Partly met  Improve Drainage Characteristics - Progress Met  Patient/Family will be able to  direct repositioning  Patient/Family Instruction Goal - Progress Met  Goals/treatment plan/discharge plan were made with and agreed upon by patient/family  (d/c hydro)

## 2020-12-15 NOTE — Progress Notes (Signed)
Attempted to call report to [facility] using number provided in chart. [phone]. Unable to leave voicemail, will attempt to call again later.

## 2020-12-15 NOTE — TOC Transition Note (Signed)
Transition of Care [facility]) - CM/SW Discharge Note  Patient Details  Name: [name] MRN: [id number] Date of Birth: [birth year]  [facility] CM/SW Contact:  Ewing Schlein, LCSW Phone Number: 12/15/2020, 4:17 PM  Clinical Narrative: Tanya with [facility] has a bed today. Patient will go to room 28A and the number for report is [phone]. Sister assisted CSW with getting copy of patient's COVID vaccine record to [place]. Discharge summary, discharge orders, and SNF transfer report faxed to facility in hub. Medical necessity form done; PTAR scheduled. Discharge packet completed. RN updated. Sister notified of discharge to SNF. TOC signing off.  Final next level of care: Skilled Nursing Facility Barriers to Discharge: Barriers Resolved  Patient Goals and CMS Choice Patient states their goals for this hospitalization and ongoing recovery are:: Go to rehab for wound care CMS Medicare.gov Compare Post Acute Care list provided to:: Patient Represenative (must comment) Choice offered to / list presented to : [facility] POA / Guardian  Discharge Placement PASRR number recieved: 12/14/20       Patient chooses bed at: [facility] Patient to be transferred to facility by: PTAR Name of family member notified: Monna Fam (sister/POA) Patient and family notified of of transfer: 12/15/20  Discharge Plan and Services In-house Referral: Clinical Social Work Post Acute Care Choice: Skilled Nursing Facility          DME Arranged: N/A DME Agency: NA  Readmission Risk Interventions Readmission Risk Prevention Plan 08/03/2020  Post Dischage Appt Complete  Medication Screening Complete  Some recent data might be hidden

## 2020-12-15 NOTE — Progress Notes (Addendum)
7 Days Post-Op  Subjective: CC: Seen w/ hydro  Objective: Vital signs in last 24 hours: Temp:  [98.2 F (36.8 C)-99.1 F (37.3 C)] 98.7 F (37.1 C) (07/22 0530) Pulse Rate:  [81-96] 81 (07/22 0530) Resp:  [16-18] 16 (07/22 0530) BP: (100-114)/(62-82) 100/62 (07/22 0530) SpO2:  [95 %-99 %] 98 % (07/22 0530) Last BM Date: 12/14/20  Intake/Output from previous day: 07/21 0701 - 07/22 0700 In: 1174 [P.O.:1174] Out: 600 [Urine:600] Intake/Output this shift: Total I/O In: 120 [P.O.:120] Out: 250 [Urine:250]  PE: Wound as pictured below. No drainage from wound. It does not tunnel or track. Peri-wound with some faint erythema that appears to be 2/2 pressure and is stable. No induration or heat. Wound bed clean and appears to have more granulation tissue when compared to 7/18    Lab Results:  No results for input(s): WBC, HGB, HCT, PLT in the last 72 hours. BMET No results for input(s): NA, K, CL, CO2, GLUCOSE, BUN, CREATININE, CALCIUM in the last 72 hours. PT/INR No results for input(s): LABPROT, INR in the last 72 hours. CMP     Component Value Date/Time   NA 138 12/11/2020 1212   NA 141 04/15/2019 1109   K 4.3 12/11/2020 1212   CL 106 12/11/2020 1212   CO2 25 12/11/2020 1212   GLUCOSE 83 12/11/2020 1212   BUN 11 12/11/2020 1212   BUN 15 04/15/2019 1109   CREATININE 0.36 (L) 12/11/2020 1212   CREATININE 0.48 (L) 09/06/2019 0921   CALCIUM 8.9 12/11/2020 1212   PROT 6.7 12/11/2020 1212   PROT 6.9 06/04/2018 1440   ALBUMIN 2.5 (L) 12/11/2020 1212   ALBUMIN 3.7 06/04/2018 1440   AST 17 12/11/2020 1212   ALT 17 12/11/2020 1212   ALKPHOS 109 12/11/2020 1212   BILITOT 0.6 12/11/2020 1212   BILITOT <0.2 06/04/2018 1440   GFRNONAA >60 12/11/2020 1212   GFRNONAA 121 09/06/2019 0921   GFRAA >60 11/12/2019 1237   GFRAA 140 09/06/2019 0921   Lipase     Component Value Date/Time   LIPASE 22 11/25/2020 2145    Studies/Results: No results  found.  Anti-infectives: Anti-infectives (From admission, onward)    Start     Dose/Rate Route Frequency Ordered Stop   12/14/20 2300  doxycycline (VIBRAMYCIN) 25 MG/5ML suspension 100 mg        100 mg Oral Every 12 hours 12/14/20 2233     12/14/20 2300  amoxicillin-clavulanate (AUGMENTIN) 400-57 MG/5ML suspension 875 mg        875 mg Oral Every 12 hours 12/14/20 2233     12/11/20 2200  doxycycline (VIBRA-TABS) tablet 100 mg  Status:  Discontinued        100 mg Oral Every 12 hours 12/11/20 1441 12/14/20 2233   12/11/20 2200  amoxicillin-clavulanate (AUGMENTIN) 875-125 MG per tablet 1 tablet  Status:  Discontinued        1 tablet Oral Every 12 hours 12/11/20 1441 12/14/20 2233   12/08/20 1309  sodium chloride 0.9 % with ceFAZolin (ANCEF) ADS Med  Status:  Discontinued       Note to Pharmacy: Linard Millers   : cabinet override      12/08/20 1309 12/08/20 1324   12/08/20 1200  vancomycin (VANCOREADY) IVPB 750 mg/150 mL  Status:  Discontinued        750 mg 150 mL/hr over 60 Minutes Intravenous Every 12 hours 12/08/20 0803 12/11/20 1441   12/07/20 1200  vancomycin (VANCOREADY) IVPB 1250 mg/250  mL  Status:  Discontinued        1,250 mg 166.7 mL/hr over 90 Minutes Intravenous Every 12 hours 12/06/20 2354 12/08/20 0803   12/07/20 0000  cefTRIAXone (ROCEPHIN) 2 g in sodium chloride 0.9 % 100 mL IVPB  Status:  Discontinued        2 g 200 mL/hr over 30 Minutes Intravenous Every 24 hours 12/06/20 2258 12/11/20 1441   12/06/20 2345  vancomycin (VANCOCIN) IVPB 1000 mg/200 mL premix  Status:  Discontinued        1,000 mg 200 mL/hr over 60 Minutes Intravenous  Once 12/06/20 2258 12/06/20 2309   12/06/20 2345  vancomycin (VANCOREADY) IVPB 1250 mg/250 mL        1,250 mg 166.7 mL/hr over 90 Minutes Intravenous  Once 12/06/20 2251 12/07/20 0121   12/06/20 1530  ceFAZolin (ANCEF) IVPB 1 g/50 mL premix        1 g 100 mL/hr over 30 Minutes Intravenous  Once 12/06/20 1527 12/06/20 1730         Assessment/Plan POD 7 s/p excisional debridement of sacral wound - Dr. Sheliah Hatch - 7/15 - D/c Hydrotherapy - Cont BID WTD - No further surgical debridement indicated. We will be available as needed moving forward. - Ok to d/c abx from our standpoint. Defer to [facility] - Awaiting SNF. Okay for d/c from our standpoint. Follow up in wound clinic. Can follow up with [place] as needed. I reached out to [facility] w/ these recommendations.    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

## 2020-12-15 NOTE — Progress Notes (Signed)
Report given to Lonie Peak, LPN at [facility].

## 2020-12-15 NOTE — Discharge Summary (Signed)
Discharge Summary  DAX MURGUIA XAJ:[id number] DOB: 09-13-1960  PCP: Harvest Forest, MD  Admit date: 12/06/2020 Discharge date: 12/15/2020  Time spent: 35 minutes  Recommendations for Outpatient Follow-up:  Patient being discharged to skilled facility Please note wound care as below. Patient will follow up with wound clinic in approximately 2 weeks  Discharge Diagnoses:  Active Hospital Problems   Diagnosis Date Noted   Cellulitis of sacral region 12/06/2020   Recurrent right pleural effusion 10/02/2020   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   Cerebral palsy, quadriplegic (HCC)    Intellectual disability 07/24/2006    Resolved Hospital Problems  No resolved problems to display.    Discharge Condition: Improved, being discharged to skilled nursing facility  Diet recommendation: Dysphagia 1 diet  Vitals:   12/14/20 2217 12/15/20 0530  BP: 114/82 100/62  Pulse: 81 81  Resp: 18 16  Temp: 99.1 F (37.3 C) 98.7 F (37.1 C)  SpO2: 95% 98%    History of present illness:  60 year old male with past medical history of bipolar disorder, DVT, cerebral palsy, status post right BKA and quadriplegia with the exception of left lower extremity admitted on 7/13 for worsening sacral wound with increased pain.  Patient was admitted after work-up noted left gluteal cellulitis.  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  Patient also was put on IV antibiotics.  Following debridement, patient underwent hydrotherapy and twice daily wet-to-dry dressings.  By 7/22, completed 10 days of antibiotics as well as surgery able to stop his hydrotherapy.  At this time, accepted for skilled nursing facility. Active Problems:   Intellectual disability   Cerebral palsy, quadriplegic (HCC): Continue supportive treatment    Hx of right BKA (HCC)  Constipation: Continue lactulose  and as needed MiraLAX  Pressure Injury Arm Left;Posterior;Proximal;Upper;Other (Comment) Stage II -  Partial thickness loss of dermis presenting as a shallow open ulcer with a red, pink wound bed without slough. pink open wound on armpit (Active)     Location: Arm  Location Orientation: Left;Posterior;Proximal;Upper;Other (Comment)  Staging: Stage II -  Partial thickness loss of dermis presenting as a shallow open ulcer with a red, pink wound bed without slough.  Wound Description (Comments): pink open wound on armpit  Present on Admission: Yes     Pressure Injury 07/31/20 Foot Left;Medial Stage 3 -  Full thickness tissue loss. Subcutaneous fat may be visible but bone, tendon or muscle are NOT exposed. (Active)  07/31/20 2152  Location: Foot  Location Orientation: Left;Medial  Staging: Stage 3 -  Full thickness tissue loss. Subcutaneous fat may be visible but bone, tendon or muscle are NOT exposed.  Wound Description (Comments):   Present on Admission: Yes     Pressure Injury 08/01/20 Knee Left;Medial Stage 1 -  Intact skin with non-blanchable redness of a localized area usually over a bony prominence. (Active)  08/01/20   Location: Knee  Location Orientation: Left;Medial  Staging: Stage 1 -  Intact skin with non-blanchable redness of a localized area usually over a bony prominence.  Wound Description (Comments):   Present on Admission: Yes     Pressure Injury 08/01/20 Sacrum Right Unstageable - Full thickness tissue loss in which the base of the injury is covered by slough (yellow, tan, gray, green or brown) and/or eschar (tan, brown or black) in the wound bed. (Active)  08/01/20   Location: Sacrum  Location Orientation: Right  Staging: Unstageable - Full thickness tissue loss in which the base of the injury is covered by slough (yellow, tan, gray, green or brown) and/or eschar (tan, brown or black) in the wound bed.  Wound Description (Comments):   Present on Admission: Yes      Pressure Injury 10/06/20 Thigh Left;Lateral Stage 1 -  Intact skin with non-blanchable redness of a localized area usually over a bony prominence. Yellow wound bed (Active)  10/06/20 0800 (treatment was already in place.)  Location: Thigh  Location Orientation: Left;Lateral  Staging: Stage 1 -  Intact skin with non-blanchable redness of a localized area usually over a bony prominence.  Wound Description (Comments): Yellow wound bed  Present on Admission:      Pressure Injury 10/06/20 Thigh Right;Lateral Stage 1 -  Intact skin with non-blanchable redness of a localized area usually over a bony prominence. red nonblanchable (Active)  10/06/20 0800  Location: Thigh  Location Orientation: Right;Lateral  Staging: Stage 1 -  Intact skin with non-blanchable redness of a localized area usually over a bony prominence.  Wound Description (Comments): red nonblanchable  Present on Admission:      Pressure Injury 12/12/20 Sacrum Right Unstageable - Full thickness tissue loss in which the base of the injury is covered by slough (yellow, tan, gray, green or brown) and/or eschar (tan, brown or black) in the wound bed. PT only, wound on right sacral a (Active)  12/12/20   Location: Sacrum  Location Orientation: Right  Staging: Unstageable - Full thickness tissue loss in which the base of the injury is covered by slough (yellow, tan, gray, green or brown) and/or eschar (tan, brown or black) in the wound bed.  Wound Description (Comments): PT only, wound on right sacral area, painful to touch, slough in wound bed.  Present on Admission: Yes     Consultants: General surgery   Procedures: Status postdebridement 7/15  Discharge Exam: BP 100/62 (BP Location: Right Arm)   Pulse 81   Temp 98.7 F (37.1 C)   Resp 16   Ht 6' (1.829 m)   Wt 68 kg   SpO2 98%   BMI 20.33 kg/m   General: Awake, oriented x2, no acute distress Cardiovascular: Regular rate and rhythm, S1-S2 Respiratory: Clear to  auscultation bilaterally  Discharge Instructions You were cared for by a hospitalist during your hospital stay. If you have any questions about your discharge medications or the care you received while you were in the hospital after you are discharged, you can call the unit and asked to speak with the hospitalist on call if the hospitalist that took care of you is not available. Once you are discharged, your primary care physician will handle any further medical issues. Please note that NO REFILLS for any discharge medications will be authorized once you are discharged, as it is imperative that you return to your primary care physician (or establish a relationship with a primary care physician if you do not have one) for your aftercare needs so that they can reassess your need for medications and monitor your lab values.  Discharge Instructions     DIET - DYS 1   Complete by: As directed    Fluid consistency: Thin   Discharge wound care:   Complete by: As directed    Wound care to right lateral thigh/hip and right ear lesions:  Cleanse with NS, pat dry. Cover with folded piece of xeroform gauze [place] # 294), top with dry gauze and cover with silicone foam.  Change xeroform twice daily, may reuse silicone foam for up to three days.  Wound care to left anterior foot and left medial malleolus:  Cleanse with NS, pat gently dry. Cover with folded xeroform gauze [employer] [place] # 294), top with dry gauze and cover with silicone foam dressing.  Change xeroform twice daily, may reused silicone foam for up to 3 days. Change PRN for soiling or loosening of dressing edges.  Wound care to Unstageable pressure injury to sacrum:  Cleanse with NS, pat gently dry. Apply collagenase (santyl) to nonviable tissue, cover wound and collagenase with saline moistened gauze, top with dry gauze, ABD and secure with paper tape. Change BID and PRN soiling.   Increase activity slowly   Complete by: As directed       Allergies  as of 12/15/2020       Reactions   Adhesive [tape] Rash        Medication List     STOP taking these medications    bisacodyl 10 MG suppository Commonly known as: DULCOLAX Replaced by: bisacodyl 5 MG EC tablet   brompheniramine-pseudoephedrine 1-15 MG/5ML Elix Commonly known as: DIMETAPP   hydrocortisone cream 1 %   Preparation H Rapid Relief 5-0.25-14.4-15 % Crea   TRIPLE ANTIBIOTIC EX       TAKE these medications    acetaminophen 500 MG tablet Commonly known as: TYLENOL Take 500 mg by mouth every 6 (six) hours as needed (for aches, pains, and/or fevers).   albuterol 108 (90 Base) MCG/ACT inhaler Commonly known as: VENTOLIN HFA Inhale 2 puffs into the lungs every 4 (four) hours as needed for shortness of breath or wheezing.   alum & mag hydroxide-simeth 200-200-20 MG/5ML suspension Commonly known as: MAALOX/MYLANTA Take 15-30 mLs by mouth every 6 (six) hours as needed for indigestion or heartburn (or nausea).   baclofen 20 MG tablet Commonly known as: LIORESAL Take 1 tablet (20 mg total) by mouth 3 (three) times daily.   bisacodyl 5 MG EC tablet Commonly known as: DULCOLAX Take 1 tablet (5 mg total) by mouth daily as needed for moderate constipation. Replaces: bisacodyl 10 MG suppository   cetirizine 10 MG tablet Commonly known as: ZYRTEC Take 1 tablet (10 mg total) by mouth daily. What changed: when to take this   diclofenac Sodium 1 % Gel Commonly known as: Voltaren Apply 4 g topically See admin instructions. Apply 4 grams to painful areas four times a day What changed: See the new instructions.   divalproex 125 MG capsule Commonly known as: DEPAKOTE SPRINKLE Take 500 mg by mouth 2 (two) times daily. (0700 & 2000)   Ensure Take 237 mLs by mouth 2 (two) times daily between meals.   Ferrex 150 150 MG capsule Generic drug: iron polysaccharides Take 150 mg by mouth daily at 12 noon.   fluticasone 50 MCG/ACT nasal spray Commonly known as:  FLONASE Place 1 spray into both nostrils daily. (0700)   guaiFENesin-dextromethorphan 100-10 MG/5ML syrup Commonly known as: ROBITUSSIN DM Take 10 mLs by mouth every 4 (four) hours as needed for cough.   HYDROcodone-acetaminophen 5-325 MG tablet Commonly known as: NORCO/VICODIN Take 1 tablet by mouth every 4 (four) hours as needed for severe pain.   lactulose 10 GM/15ML solution Commonly known as: CHRONULAC Take 15 mLs (10 g total) by mouth daily. Start taking on: December 16, 2020   liver oil-zinc oxide 40 % ointment Commonly known as: DESITIN Apply 1 application topically 2 (two) times daily as needed (unspecified). What changed: additional  instructions   ondansetron 4 MG tablet Commonly known as: ZOFRAN Take 1 tablet (4 mg total) by mouth every 6 (six) hours as needed for nausea.   polyethylene glycol powder 17 GM/SCOOP powder Commonly known as: GLYCOLAX/MIRALAX Take 17 g by mouth See admin instructions. Mix 17 grams as directed every morning and once daily as needed for constipation   Prevalon Heel Protector Misc 1 Device by Does not apply route daily.   Santyl ointment Generic drug: collagenase Apply 1 application topically See admin instructions. Apply to wound in the sacral area once a day   tretinoin 0.025 % gel Commonly known as: RETIN-A Apply 1 application topically See admin instructions. Apply to the affected areas of the face at bedtime               Discharge Care Instructions  (From admission, onward)           Start     Ordered   12/15/20 0000  Discharge wound care:       Comments: Wound care to right lateral thigh/hip and right ear lesions:  Cleanse with NS, pat dry. Cover with folded piece of xeroform gauze [place] # 294), top with dry gauze and cover with silicone foam.  Change xeroform twice daily, may reuse silicone foam for up to three days.  Wound care to left anterior foot and left medial malleolus:  Cleanse with NS, pat gently dry. Cover  with folded xeroform gauze [place] # 294), top with dry gauze and cover with silicone foam dressing.  Change xeroform twice daily, may reused silicone foam for up to 3 days. Change PRN for soiling or loosening of dressing edges.  Wound care to Unstageable pressure injury to sacrum:  Cleanse with NS, pat gently dry. Apply collagenase (santyl) to nonviable tissue, cover wound and collagenase with saline moistened gauze, top with dry gauze, ABD and secure with paper tape. Change BID and PRN soiling.   12/15/20 1537           Allergies  Allergen Reactions   Adhesive [Tape] Rash    Follow-up Information     [facility] Follow up.   Specialty: General Surgery Why: As needed Contact information: [address] [place] [address] [phone]                  The results of significant diagnostics from this hospitalization (including imaging, microbiology, ancillary and laboratory) are listed below for reference.    Significant Diagnostic Studies: CT ABDOMEN PELVIS W CONTRAST  Result Date: 12/06/2020 CLINICAL DATA:  Sacral pressure ulcer.  Question wound dehiscence. EXAM: CT ABDOMEN AND PELVIS WITH CONTRAST TECHNIQUE: Multidetector CT imaging of the abdomen and pelvis was performed using the standard protocol following bolus administration of intravenous contrast. CONTRAST:  80mL OMNIPAQUE IOHEXOL 350 MG/ML SOLN COMPARISON:  CT abdomen pelvis 09/05/2020 FINDINGS: Lower chest: Patient scanned in the right lateral decubitus position. Left lung is clear. L hepatic changes noted in the right lung. Right pleural effusion noted. Heart size is normal. Hepatobiliary: Dependent stones present in the gallbladder fundus. No inflammatory change. Is some edema about the bile ducts. Obstruction present. Pancreas: Unremarkable. No pancreatic ductal dilatation or surrounding inflammatory changes. Spleen: Normal in size without focal abnormality. Adrenals/Urinary Tract:  Adrenal glands are unremarkable. Kidneys are normal, without renal calculi, focal lesion, or hydronephrosis. Bladder is unremarkable. Stomach/Bowel: Large hiatal hernia again noted. Distal stomach and duodenum within normal limits. Small bowel unremarkable. Proximal colon within normal  limits. Large amount of stool present within the rectum. Transverse diameter up to 8 cm. Extensive soft tissue swelling noted in the left gluteal fold extending to the muscles. No drainable fluid collection is present. No sinus tract evident. No intra or extraperitoneal fluid or gas evident. Vascular/Lymphatic: Atherosclerotic calcifications present in aorta branch vessels without aneurysm. Reproductive: Prostate is unremarkable. Other: No abdominal wall hernia or abnormality. No abdominopelvic ascites. Musculoskeletal: Lytic blastic lesions are present. Facet degenerative changes are noted in the lower lumbar spine. Bony pelvis within normal limits. IMPRESSION: 1. Extensive soft tissue swelling in the left gluteal fold extending to the muscles compatible with cellulitis. No drainable fluid collection is present. 2. No sinus tract evident. 3. Large amount of stool within the rectum. 4. Right pleural effusion. 5. Cholelithiasis without evidence for cholecystitis. 6. Large hiatal hernia. 7. Aortic Atherosclerosis (ICD10-I70.0). Electronically Signed   By: Marin Roberts M.D.   On: 12/06/2020 15:17   DG Chest Portable 1 View  Result Date: 11/25/2020 CLINICAL DATA:  Cough with chest and abdominal pain. EXAM: PORTABLE CHEST 1 VIEW COMPARISON:  10/22/2020 FINDINGS: Examination is technically limited due to patient positioning and body habitus. Severe patient rotation. There appears to be shallow inspiration. Cardiac enlargement. Probable right perihilar infiltration. This could indicate pneumonia. No definite pleural effusion or pneumothorax. IMPRESSION: Shallow inspiration.  Probable right perihilar infiltrate. Electronically  Signed   By: Burman Nieves M.D.   On: 11/25/2020 23:05    Microbiology: Recent Results (from the past 240 hour(s))  Resp Panel by RT-PCR (Flu A&B, Covid) Nasopharyngeal Swab     Status: None   Collection Time: 12/06/20  4:51 PM   Specimen: Nasopharyngeal Swab; Nasopharyngeal(NP) swabs in vial transport medium  Result Value Ref Range Status   SARS Coronavirus 2 by RT PCR NEGATIVE NEGATIVE Final    Comment: (NOTE) SARS-CoV-2 target nucleic acids are NOT DETECTED.  The SARS-CoV-2 RNA is generally detectable in upper respiratory specimens during the acute phase of infection. The lowest concentration of SARS-CoV-2 viral copies this assay can detect is 138 copies/mL. A negative result does not preclude SARS-Cov-2 infection and should not be used as the sole basis for treatment or other patient management decisions. A negative result may occur with  improper specimen collection/handling, submission of specimen other than nasopharyngeal swab, presence of viral mutation(s) within the areas targeted by this assay, and inadequate number of viral copies(<138 copies/mL). A negative result must be combined with clinical observations, patient history, and epidemiological information. The expected result is Negative.  Fact Sheet for Patients:  BloggerCourse.com  Fact Sheet for Healthcare Providers:  SeriousBroker.it  This test is no t yet approved or cleared by the [place] FDA and  has been authorized for detection and/or diagnosis of SARS-CoV-2 by FDA under an Emergency Use Authorization (EUA). This EUA will remain  in effect (meaning this test can be used) for the duration of the COVID-19 declaration under Section 564(b)(1) of the Act, 21 U.S.C.section 360bbb-3(b)(1), unless the authorization is terminated  or revoked sooner.       Influenza A by PCR NEGATIVE NEGATIVE Final   Influenza B by PCR NEGATIVE NEGATIVE Final    Comment:  (NOTE) The Xpert Xpress SARS-CoV-2/FLU/RSV plus assay is intended as an aid in the diagnosis of influenza from Nasopharyngeal swab specimens and should not be used as a sole basis for treatment. Nasal washings and aspirates are unacceptable for Xpert Xpress SARS-CoV-2/FLU/RSV testing.  Fact Sheet for Patients: BloggerCourse.com  Fact Sheet for  Healthcare Providers: SeriousBroker.ithttps://www.fda.gov/media/152162/download  This test is not yet approved or cleared by the [place] FDA and has been authorized for detection and/or diagnosis of SARS-CoV-2 by FDA under an Emergency Use Authorization (EUA). This EUA will remain in effect (meaning this test can be used) for the duration of the COVID-19 declaration under Section 564(b)(1) of the Act, 21 U.S.C. section 360bbb-3(b)(1), unless the authorization is terminated or revoked.  Performed at [facility], [address] [place], [place] [address]   Culture, blood (routine x 2)     Status: None   Collection Time: 12/06/20  4:51 PM   Specimen: BLOOD  Result Value Ref Range Status   Specimen Description   Final    BLOOD BLOOD LEFT HAND Performed at [facility], [address] [place], [place] [address]    Special Requests   Final    BOTTLES DRAWN AEROBIC AND ANAEROBIC Blood Culture adequate volume Performed at [facility], [address] [place], [place] [address]    Culture   Final    NO GROWTH 5 DAYS Performed at [facility], [address] [place], [place] [address]    Report Status 12/11/2020 FINAL  Final  Culture, blood (routine x 2)     Status: Abnormal   Collection Time: 12/07/20 12:23 AM   Specimen: Left Antecubital; Blood  Result Value Ref Range Status   Specimen Description   Final    LEFT ANTECUBITAL Performed at [facility], [address] [place], [place] [address]    Special Requests   Final    Blood  Culture adequate volume BOTTLES DRAWN AEROBIC ONLY   Culture  Setup Time   Final    GRAM POSITIVE COCCI IN CLUSTERS AEROBIC BOTTLE ONLY CRITICAL RESULT CALLED TO, READ BACK BY AND VERIFIED WITH: PHARMD C.SHEAD at 0820 on 12/08/2020 by [email or web address].    Culture (A)  Final    STAPHYLOCOCCUS HOMINIS THE SIGNIFICANCE OF ISOLATING THIS ORGANISM FROM A SINGLE SET OF BLOOD CULTURES WHEN MULTIPLE SETS ARE DRAWN IS UNCERTAIN. PLEASE NOTIFY THE MICROBIOLOGY DEPARTMENT WITHIN ONE WEEK IF SPECIATION AND SENSITIVITIES ARE REQUIRED. Performed at [facility], [address] [place], [place] [address]    Report Status 12/09/2020 FINAL  Final  Blood Culture ID Panel (Reflexed)     Status: Abnormal   Collection Time: 12/07/20 12:23 AM  Result Value Ref Range Status   Enterococcus faecalis NOT DETECTED NOT DETECTED Final   Enterococcus Faecium NOT DETECTED NOT DETECTED Final   Listeria monocytogenes NOT DETECTED NOT DETECTED Final   Staphylococcus species DETECTED (A) NOT DETECTED Final    Comment: CRITICAL RESULT CALLED TO, READ BACK BY AND VERIFIED WITH: PHARMD C.SHED AT 0820 ON 12/08/2020 BY T.SAAD.    Staphylococcus aureus (BCID) NOT DETECTED NOT DETECTED Final   Staphylococcus epidermidis NOT DETECTED NOT DETECTED Final   Staphylococcus lugdunensis NOT DETECTED NOT DETECTED Final   Streptococcus species NOT DETECTED NOT DETECTED Final   Streptococcus agalactiae NOT DETECTED NOT DETECTED Final   Streptococcus pneumoniae NOT DETECTED NOT DETECTED Final   Streptococcus pyogenes NOT DETECTED NOT DETECTED Final   A.calcoaceticus-baumannii NOT DETECTED NOT DETECTED Final   Bacteroides fragilis NOT DETECTED NOT DETECTED Final   Enterobacterales NOT DETECTED NOT DETECTED Final   Enterobacter cloacae complex NOT DETECTED NOT DETECTED Final   Escherichia coli NOT DETECTED NOT DETECTED Final   Klebsiella aerogenes NOT DETECTED NOT DETECTED Final   Klebsiella oxytoca NOT  DETECTED NOT DETECTED Final    Klebsiella pneumoniae NOT DETECTED NOT DETECTED Final   Proteus species NOT DETECTED NOT DETECTED Final   Salmonella species NOT DETECTED NOT DETECTED Final   Serratia marcescens NOT DETECTED NOT DETECTED Final   Haemophilus influenzae NOT DETECTED NOT DETECTED Final   Neisseria meningitidis NOT DETECTED NOT DETECTED Final   Pseudomonas aeruginosa NOT DETECTED NOT DETECTED Final   Stenotrophomonas maltophilia NOT DETECTED NOT DETECTED Final   Candida albicans NOT DETECTED NOT DETECTED Final   Candida auris NOT DETECTED NOT DETECTED Final   Candida glabrata NOT DETECTED NOT DETECTED Final   Candida krusei NOT DETECTED NOT DETECTED Final   Candida parapsilosis NOT DETECTED NOT DETECTED Final   Candida tropicalis NOT DETECTED NOT DETECTED Final   Cryptococcus neoformans/gattii NOT DETECTED NOT DETECTED Final    Comment: Performed at [facility], [address] [place], [place] [address]  MRSA Next Gen by PCR, Nasal     Status: Abnormal   Collection Time: 12/07/20  9:23 AM   Specimen: Nasal Mucosa; Nasal Swab  Result Value Ref Range Status   MRSA by PCR Next Gen DETECTED (A) NOT DETECTED Final    Comment: RESULT CALLED TO, READ BACK BY AND VERIFIED WITH: K BLOOM AT 1852 ON 12/07/2020 BY MOSLEY,J  (NOTE) The GeneXpert MRSA Assay (FDA approved for NASAL specimens only), is one component of a comprehensive MRSA colonization surveillance program. It is not intended to diagnose MRSA infection nor to guide or monitor treatment for MRSA infections. Test performance is not FDA approved in patients less than 107 years old. Performed at [facility], [address] [place], [place] [address]      Labs: Basic Metabolic Panel: Recent Labs  Lab 12/09/20 0417 12/11/20 1212  NA 136 138  K 3.9 4.3  CL 103 106  CO2 27 25  GLUCOSE 90 83  BUN 9 11  CREATININE 0.39* 0.36*  CALCIUM 8.5* 8.9   Liver Function Tests: Recent Labs  Lab 12/09/20 0417 12/11/20 1212   AST 15 17  ALT 15 17  ALKPHOS 101 109  BILITOT 0.4 0.6  PROT 6.5 6.7  ALBUMIN 2.2* 2.5*   No results for input(s): LIPASE, AMYLASE in the last 168 hours. No results for input(s): AMMONIA in the last 168 hours. CBC: Recent Labs  Lab 12/09/20 0417 12/11/20 1212  WBC 10.8* 10.0  NEUTROABS 7.5  --   HGB 11.0* 11.2*  HCT 35.0* 36.0*  MCV 75.8* 76.6*  PLT 644* 667*   Cardiac Enzymes: No results for input(s): CKTOTAL, CKMB, CKMBINDEX, TROPONINI in the last 168 hours. BNP: BNP (last 3 results) No results for input(s): BNP in the last 8760 hours.  ProBNP (last 3 results) No results for input(s): PROBNP in the last 8760 hours.  CBG: No results for input(s): GLUCAP in the last 168 hours.     Signed:  Hollice Espy, MD [facility] 12/15/2020, 3:39 PM

## 2020-12-16 NOTE — Progress Notes (Signed)
Pt d/c to [facility] via [facility]. Pts VSS before transport. All belongings gathered and with patient. IV removed. Updated pt's sister [name] of discharge to SNF.

## 2021-02-08 NOTE — Telephone Encounter (Signed)
Error entry disregard  

## 2021-02-20 ENCOUNTER — Inpatient Hospital Stay: Admit: 2021-02-20 | Payer: Medicare Other

## 2021-02-20 ENCOUNTER — Other Ambulatory Visit: Payer: Self-pay

## 2021-02-20 ENCOUNTER — Inpatient Hospital Stay
Admission: EM | Admit: 2021-02-20 | Discharge: 2021-03-03 | DRG: 177 | Disposition: A | Discharge status: Home Health | Payer: Medicare Other | Attending: Internal Medicine | Admitting: Internal Medicine

## 2021-02-20 ENCOUNTER — Inpatient Hospital Stay: Payer: Medicare Other

## 2021-02-20 ENCOUNTER — Emergency Department: Payer: Medicare Other

## 2021-02-20 DIAGNOSIS — Z66 Do not resuscitate: Secondary | ICD-10-CM | POA: Diagnosis present

## 2021-02-20 DIAGNOSIS — L89899 Pressure ulcer of other site, unspecified stage: Secondary | ICD-10-CM | POA: Diagnosis present

## 2021-02-20 DIAGNOSIS — L89892 Pressure ulcer of other site, stage 2: Secondary | ICD-10-CM | POA: Diagnosis present

## 2021-02-20 DIAGNOSIS — L03116 Cellulitis of left lower limb: Secondary | ICD-10-CM | POA: Diagnosis present

## 2021-02-20 DIAGNOSIS — L89153 Pressure ulcer of sacral region, stage 3: Secondary | ICD-10-CM | POA: Diagnosis present

## 2021-02-20 DIAGNOSIS — Z87440 Personal history of urinary (tract) infections: Secondary | ICD-10-CM

## 2021-02-20 DIAGNOSIS — E43 Unspecified severe protein-calorie malnutrition: Secondary | ICD-10-CM | POA: Diagnosis present

## 2021-02-20 DIAGNOSIS — I872 Venous insufficiency (chronic) (peripheral): Secondary | ICD-10-CM | POA: Diagnosis present

## 2021-02-20 DIAGNOSIS — I878 Other specified disorders of veins: Secondary | ICD-10-CM | POA: Diagnosis present

## 2021-02-20 DIAGNOSIS — R21 Rash and other nonspecific skin eruption: Secondary | ICD-10-CM | POA: Diagnosis present

## 2021-02-20 DIAGNOSIS — G808 Other cerebral palsy: Secondary | ICD-10-CM | POA: Diagnosis present

## 2021-02-20 DIAGNOSIS — Z89511 Acquired absence of right leg below knee: Secondary | ICD-10-CM

## 2021-02-20 DIAGNOSIS — J1282 Pneumonia due to coronavirus disease 2019: Secondary | ICD-10-CM | POA: Diagnosis not present

## 2021-02-20 DIAGNOSIS — Z79899 Other long term (current) drug therapy: Secondary | ICD-10-CM

## 2021-02-20 DIAGNOSIS — Z8619 Personal history of other infectious and parasitic diseases: Secondary | ICD-10-CM

## 2021-02-20 DIAGNOSIS — R008 Other abnormalities of heart beat: Secondary | ICD-10-CM | POA: Diagnosis not present

## 2021-02-20 DIAGNOSIS — A419 Sepsis, unspecified organism: Secondary | ICD-10-CM | POA: Diagnosis present

## 2021-02-20 DIAGNOSIS — U071 COVID-19: Principal | ICD-10-CM | POA: Diagnosis present

## 2021-02-20 DIAGNOSIS — F319 Bipolar disorder, unspecified: Secondary | ICD-10-CM | POA: Diagnosis present

## 2021-02-20 DIAGNOSIS — R6520 Severe sepsis without septic shock: Secondary | ICD-10-CM | POA: Diagnosis present

## 2021-02-20 DIAGNOSIS — K219 Gastro-esophageal reflux disease without esophagitis: Secondary | ICD-10-CM | POA: Diagnosis present

## 2021-02-20 DIAGNOSIS — D509 Iron deficiency anemia, unspecified: Secondary | ICD-10-CM | POA: Diagnosis present

## 2021-02-20 DIAGNOSIS — F429 Obsessive-compulsive disorder, unspecified: Secondary | ICD-10-CM | POA: Diagnosis present

## 2021-02-20 DIAGNOSIS — R109 Unspecified abdominal pain: Secondary | ICD-10-CM | POA: Diagnosis present

## 2021-02-20 DIAGNOSIS — E860 Dehydration: Secondary | ICD-10-CM | POA: Diagnosis present

## 2021-02-20 HISTORY — DX: Sepsis, unspecified organism: A41.9

## 2021-02-20 HISTORY — DX: Sepsis, unspecified organism: R65.20

## 2021-02-20 LAB — COMPREHENSIVE METABOLIC PANEL
ALT: 131 U/L — ABNORMAL HIGH (ref 0–44)
AST: 79 U/L — ABNORMAL HIGH (ref 15–41)
Albumin: 3.1 g/dL — ABNORMAL LOW (ref 3.5–5.0)
Alkaline Phosphatase: 169 U/L — ABNORMAL HIGH (ref 38–126)
Anion gap: 12 (ref 5–15)
BUN: 26 mg/dL — ABNORMAL HIGH (ref 6–20)
CO2: 25 mmol/L (ref 22–32)
Calcium: 9.5 mg/dL (ref 8.9–10.3)
Chloride: 101 mmol/L (ref 98–111)
Creatinine, Ser: 0.73 mg/dL (ref 0.61–1.24)
GFR, Estimated: >60 mL/min (ref >60)
Glucose, Bld: 132 mg/dL — ABNORMAL HIGH (ref 70–99)
Potassium: 3.7 mmol/L (ref 3.5–5.1)
Sodium: 138 mmol/L (ref 135–145)
Total Bilirubin: 0.7 mg/dL (ref 0.3–1.2)
Total Protein: 7.9 g/dL (ref 6.5–8.1)

## 2021-02-20 LAB — URINALYSIS, COMPLETE (UACMP) WITH MICROSCOPIC
Bacteria, UA: NONE SEEN
Bilirubin Urine: NEGATIVE
Glucose, UA: NEGATIVE mg/dL
Hgb urine dipstick: NEGATIVE
Ketones, ur: NEGATIVE mg/dL
Leukocytes,Ua: NEGATIVE
Nitrite: NEGATIVE
Protein, ur: 100 mg/dL — AB
Specific Gravity, Urine: >1.046 — ABNORMAL HIGH (ref 1.005–1.030)
Squamous Epithelial / HPF: NONE SEEN (ref 0–5)
pH: 6 (ref 5.0–8.0)

## 2021-02-20 LAB — C-REACTIVE PROTEIN: CRP: 32 mg/dL — ABNORMAL HIGH (ref <1.0)

## 2021-02-20 LAB — LACTIC ACID, PLASMA
Lactic Acid, Venous: 2 mmol/L (ref 0.5–1.9)
Lactic Acid, Venous: 6.8 mmol/L (ref 0.5–1.9)
Lactic Acid, Venous: 2.1 mmol/L (ref 0.5–1.9)
Lactic Acid, Venous: 1.5 mmol/L (ref 0.5–1.9)

## 2021-02-20 LAB — RESP PANEL BY RT-PCR (FLU A&B, COVID) ARPGX2
Influenza A by PCR: NEGATIVE
Influenza B by PCR: NEGATIVE
SARS Coronavirus 2 by RT PCR: POSITIVE — AB

## 2021-02-20 LAB — CBC WITH DIFFERENTIAL/PLATELET
Abs Immature Granulocytes: 0.19 10*3/uL — ABNORMAL HIGH (ref 0.00–0.07)
Basophils Absolute: 0.1 10*3/uL (ref 0.0–0.1)
Basophils Relative: 0 %
Eosinophils Absolute: 0.2 10*3/uL (ref 0.0–0.5)
Eosinophils Relative: 1 %
HCT: 39.8 % (ref 39.0–52.0)
Hemoglobin: 13.3 g/dL (ref 13.0–17.0)
Immature Granulocytes: 1 %
Lymphocytes Relative: 4 %
Lymphs Abs: 0.7 10*3/uL (ref 0.7–4.0)
MCH: 25 pg — ABNORMAL LOW (ref 26.0–34.0)
MCHC: 33.4 g/dL (ref 30.0–36.0)
MCV: 74.7 fL — ABNORMAL LOW (ref 80.0–100.0)
Monocytes Absolute: 0.4 10*3/uL (ref 0.1–1.0)
Monocytes Relative: 3 %
Neutro Abs: 16.1 10*3/uL — ABNORMAL HIGH (ref 1.7–7.7)
Neutrophils Relative %: 91 %
Platelets: 257 10*3/uL (ref 150–400)
RBC: 5.33 MIL/uL (ref 4.22–5.81)
RDW: 20.5 % — ABNORMAL HIGH (ref 11.5–15.5)
WBC: 17.6 10*3/uL — ABNORMAL HIGH (ref 4.0–10.5)
nRBC: 0.2 % (ref 0.0–0.2)

## 2021-02-20 LAB — TROPONIN I (HIGH SENSITIVITY): Troponin I (High Sensitivity): 13 ng/L (ref <18)

## 2021-02-20 LAB — BRAIN NATRIURETIC PEPTIDE: B Natriuretic Peptide: 41.5 pg/mL (ref 0.0–100.0)

## 2021-02-20 LAB — PROTIME-INR
INR: 1.1 (ref 0.8–1.2)
Prothrombin Time: 13.8 seconds (ref 11.4–15.2)

## 2021-02-20 LAB — MAGNESIUM: Magnesium: 1.8 mg/dL (ref 1.7–2.4)

## 2021-02-20 LAB — PROCALCITONIN: Procalcitonin: 0.55 ng/mL

## 2021-02-20 LAB — LIPASE, BLOOD: Lipase: 22 U/L (ref 11–51)

## 2021-02-20 MED ORDER — DM-GUAIFENESIN ER 30-600 MG PO TB12: 1.0000 | ORAL_TABLET | Freq: Two times a day (BID) | ORAL | Status: DC | PRN

## 2021-02-20 MED ORDER — LACTATED RINGERS IV BOLUS
500.0000 mL | Freq: Once | INTRAVENOUS | Status: AC
  Administered 2021-02-20: 500 mL via INTRAVENOUS

## 2021-02-20 MED ORDER — HEPARIN BOLUS VIA INFUSION
4000.0000 [IU] | Freq: Once | INTRAVENOUS | Status: AC
  Administered 2021-02-20: 4000 [IU] via INTRAVENOUS
  Filled 2021-02-20: qty 4000

## 2021-02-20 MED ORDER — IOHEXOL 350 MG/ML SOLN
75.0000 mL | Freq: Once | INTRAVENOUS | Status: AC | PRN
  Administered 2021-02-20: 75 mL via INTRAVENOUS
  Filled 2021-02-20: qty 75

## 2021-02-20 MED ORDER — LACTATED RINGERS IV SOLN: INTRAVENOUS | Status: DC

## 2021-02-20 MED ORDER — LACTATED RINGERS IV BOLUS
1000.0000 mL | Freq: Once | INTRAVENOUS | Status: AC
  Administered 2021-02-20: 1000 mL via INTRAVENOUS

## 2021-02-20 MED ORDER — ACETAMINOPHEN 500 MG PO TABS
1000.0000 mg | ORAL_TABLET | Freq: Once | ORAL | Status: AC
  Administered 2021-02-20: 1000 mg via ORAL
  Filled 2021-02-20: qty 2

## 2021-02-20 MED ORDER — METHYLPREDNISOLONE SODIUM SUCC 125 MG IJ SOLR
80.0000 mg | INTRAMUSCULAR | Status: DC
  Administered 2021-02-20 – 2021-02-21 (×2): 80 mg via INTRAVENOUS
  Filled 2021-02-20 (×2): qty 2

## 2021-02-20 MED ORDER — HEPARIN (PORCINE) 25000 UT/250ML-% IV SOLN
1150.0000 [IU]/h | INTRAVENOUS | Status: DC
  Administered 2021-02-20 – 2021-02-21 (×2): 1000 [IU]/h via INTRAVENOUS
  Filled 2021-02-20 (×3): qty 250

## 2021-02-20 MED ORDER — ONDANSETRON HCL 4 MG/2ML IJ SOLN
4.0000 mg | Freq: Once | INTRAMUSCULAR | Status: AC
  Administered 2021-02-20: 4 mg via INTRAVENOUS
  Filled 2021-02-20: qty 2

## 2021-02-20 MED ORDER — SODIUM CHLORIDE 0.9 % IV SOLN
100.0000 mg | Freq: Every day | INTRAVENOUS | Status: AC
  Administered 2021-02-21 – 2021-02-24 (×4): 100 mg via INTRAVENOUS
  Filled 2021-02-20 (×3): qty 100
  Filled 2021-02-20: qty 20
  Filled 2021-02-20: qty 100

## 2021-02-20 MED ORDER — METRONIDAZOLE 500 MG/100ML IV SOLN
500.0000 mg | Freq: Once | INTRAVENOUS | Status: AC
  Administered 2021-02-20: 500 mg via INTRAVENOUS
  Filled 2021-02-20: qty 100

## 2021-02-20 MED ORDER — SODIUM CHLORIDE 0.9 % IV SOLN
200.0000 mg | Freq: Once | INTRAVENOUS | Status: AC
  Administered 2021-02-20: 200 mg via INTRAVENOUS
  Filled 2021-02-20: qty 200

## 2021-02-20 MED ORDER — ACETAMINOPHEN 160 MG/5ML PO SOLN
650.0000 mg | Freq: Four times a day (QID) | ORAL | Status: DC | PRN
  Filled 2021-02-20: qty 20.3

## 2021-02-20 MED ORDER — ALBUTEROL SULFATE (2.5 MG/3ML) 0.083% IN NEBU: 2.5000 mg | INHALATION_SOLUTION | RESPIRATORY_TRACT | Status: DC | PRN

## 2021-02-20 MED ORDER — SODIUM CHLORIDE 0.9 % IV SOLN
2.0000 g | Freq: Three times a day (TID) | INTRAVENOUS | Status: DC
  Administered 2021-02-20 (×2): 2 g via INTRAVENOUS
  Filled 2021-02-20 (×2): qty 2

## 2021-02-20 MED ORDER — VANCOMYCIN HCL IN DEXTROSE 1-5 GM/200ML-% IV SOLN
1000.0000 mg | Freq: Once | INTRAVENOUS | Status: AC
  Administered 2021-02-20: 1000 mg via INTRAVENOUS
  Filled 2021-02-20: qty 200

## 2021-02-20 MED ORDER — IPRATROPIUM BROMIDE 0.02 % IN SOLN
2.5000 mL | RESPIRATORY_TRACT | Status: DC
  Administered 2021-02-21 (×3): 0.5 mg via RESPIRATORY_TRACT
  Filled 2021-02-20 (×2): qty 2.5

## 2021-02-20 MED ORDER — MORPHINE SULFATE (PF) 2 MG/ML IV SOLN: 1.0000 mg | INTRAVENOUS | Status: DC | PRN

## 2021-02-20 MED ORDER — VANCOMYCIN HCL 750 MG/150ML IV SOLN
750.0000 mg | Freq: Two times a day (BID) | INTRAVENOUS | Status: DC
  Filled 2021-02-20: qty 150

## 2021-02-20 MED ORDER — DIPHENHYDRAMINE HCL 25 MG PO CAPS
25.0000 mg | ORAL_CAPSULE | Freq: Three times a day (TID) | ORAL | Status: DC | PRN
  Administered 2021-02-20 – 2021-03-03 (×4): 25 mg via ORAL
  Filled 2021-02-20 (×4): qty 1

## 2021-02-20 MED ORDER — MORPHINE SULFATE (PF) 4 MG/ML IV SOLN
4.0000 mg | Freq: Once | INTRAVENOUS | Status: AC
  Administered 2021-02-20: 4 mg via INTRAVENOUS
  Filled 2021-02-20: qty 1

## 2021-02-20 MED ORDER — SODIUM CHLORIDE 0.9 % IV SOLN
2.0000 g | Freq: Once | INTRAVENOUS | Status: AC
  Administered 2021-02-20: 2 g via INTRAVENOUS
  Filled 2021-02-20: qty 2

## 2021-02-20 MED ORDER — ONDANSETRON HCL 4 MG/2ML IJ SOLN
4.0000 mg | Freq: Three times a day (TID) | INTRAMUSCULAR | Status: DC | PRN
  Administered 2021-02-21 – 2021-02-23 (×3): 4 mg via INTRAVENOUS
  Filled 2021-02-20 (×3): qty 2

## 2021-02-20 NOTE — ED Notes (Signed)
Pt cleaned of urine soaked brief. Tunneling sacral ulcer noted- mepilex applied. Pt repositioned for comfort and given sip of ginger ale per his request. Clean chux applied.

## 2021-02-20 NOTE — ED Provider Notes (Signed)
I assumed care of this patient from Dr. Larinda Buttery at approximate 1500.  In brief patient presents COVID-positive and septic on arrival.  He was given broad-spectrum antibiotics and started on IV fluids.  Lactic acid is elevated.  Blood and urine cultures ordered.  Plans to follow-up CT abdomen pelvis admit.   CT A/P  1. Low-attenuation 1.4 cm filling defect within the right atrium suspicious for thrombus. Recommend correlation with echocardiogram. 2. Circumferential wall thickening of the urinary bladder, which may represent cystitis. Correlation with urinalysis is recommended. 3. Chronic sacral decubitus ulcer without new bony destruction or evidence to suggest osteomyelitis. 4. Small right pleural effusion with associated dependent atelectasis. 5. Moderate-large hiatal hernia. 6. Cholelithiasis without evidence of acute cholecystitis. 7. Fluid containing right inguinal hernia. 8. Suspect scrotal wall thickening and hydroceles, partially included at the edge of the field of view. Correlate with physical exam.  Patient admitted in critical condition.   Gilles Chiquito, MD 02/20/21 [phone]

## 2021-02-20 NOTE — Sepsis Progress Note (Signed)
ELink following sepsis protocol 

## 2021-02-20 NOTE — H&P (Addendum)
History and Physical    [name] YQM:[id number] DOB: Feb 01, 1961 DOA: 02/20/2021  Referring MD/NP/PA:   PCP: Audley Hose, MD   Patient coming from:  The patient is coming from SNF .  Chief Complaint: Nausea, vomiting, abdominal pain, fever, left leg pain, cough  HPI: [name] is a 60 y.o. 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  Per report, patient has had multiple episode of nausea, vomiting since this morning. Patient complains of diffuse abdominal pain.  Denies diarrhea.  Patient also has mild dry cough, denies shortness breath or chest pain.  Patient has fever 103.3 and chills.  No symptoms of UTI.  Patient was noted to have erythematous rash in left leg, neck and face.  Patient states that rash is itchy.  Patient has left leg swelling, pain, erythema and warmth. EMS reports patient is at his baseline mental status, able to answer questions appropriately.    Patient was initially hypotensive with blood pressure 80s/58, which improved to 96/48 after giving 2.5 L LR in ED.    ED Course: pt was found to have WBC 17.6, positive COVID PCR, lactic acid of 2.0, 6.8, INR 1.1, troponin level 13, lipase 22, urinalysis negative, electrolytes renal function okay, temperature 103.3, heart rate 110, RR 21, oxygen saturation 94% on room air.  Chest x-ray showed a patchy right lung infiltration.  Patient is admitted to progressive bed as inpatient  CT abdomen/pelvis: 1. Low-attenuation 1.4 cm filling defect within the right atrium suspicious for thrombus. Recommend correlation with echocardiogram. 2. Circumferential wall thickening of the urinary bladder, which may represent  cystitis. Correlation with urinalysis is recommended. 3. Chronic sacral decubitus ulcer without new bony destruction or evidence to suggest osteomyelitis. 4. Small right pleural effusion with associated dependent atelectasis. 5. Moderate-large hiatal hernia. 6. Cholelithiasis without evidence of acute cholecystitis. 7. Fluid containing right inguinal hernia. 8. Suspect scrotal wall thickening and hydroceles, partially included at the edge of the field of view. Correlate with physical exam.  Review of Systems:   General: has fevers, chills, no body weight gain, fatigue HEENT: no blurry vision, hearing changes or sore throat Respiratory: no dyspnea, has coughing, no wheezing CV: no chest pain, no palpitations GI: has nausea, vomiting, abdominal pain, no diarrhea, constipation GU: no dysuria, burning on urination, increased urinary frequency, hematuria  Ext: s/p of right BKA. Has pain, swelling and erythema in left leg Neuro: no vision change or hearing loss. Quadriplegia except for use of left arm Skin: Has erythematous rash MSK: No muscle spasm, no deformity, no limitation of range of movement in spin Heme: No easy bruising.  Travel history: No recent long distant travel.  Allergy:  Allergies  Allergen Reactions   Adhesive [Tape] Rash    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   Anemia    microcytic anemia    Anxiety    takes Ativan daily as needed   Bipolar disorder (HCC)    takes Depakote daily   CEREBRAL PALSY 07/24/2006   Chronic bronchitis ([place])    "gets it q yr"   Chronic bronchitis (HCC)    Chronic diabetic ulcer of foot determined by examination [facility])    Constipation  takes Miralax daily as needed   Depression    takes Risperdal nightly   Environmental allergies    takes Zyrtec daily   GASTRIC ULCER ACUTE WITH HEMORRHAGE 07/24/2006   Gastric ulcer with hemorrhage    hx of    GERD (gastroesophageal reflux disease)    takes  Pravacid daily   History of blood transfusion    no abnormal reaction noted   History of hiatal hernia    History of shingles    History of stomach ulcers ~ 1996   Hypercholesterolemia    Intellectual functioning disability    Intermittent explosive disorder    Internal hemorrhoids    MRSA infection 09/06/2019   Obsessive compulsive disorder    Osteomyelitis ([place])    right foot    Pneumonia "quite a few times"   in 2015 and in June 2016   Pressure ulcer of sacral region    hx of    Pseudomonas aeruginosa infection 09/06/2019   Psychotic disorder ([place])    Recurrent UTI (urinary tract infection)    Seborrheic dermatitis    Severe sepsis (HCC)    Sigmoid diverticulosis    Spastic tetraplegia ([place])    URINARY INCONTINENCE 02/09/2010   takes Detrol daily-occasionally incontinence   Vitamin D deficiency    Weakness    numbness in extremities    Past Surgical History:  Procedure Laterality Date   AMPUTATION Right 12/14/2014   Procedure: AMPUTATION DIGIT RIGHT GREAT TOE MTP;  Surgeon: Newt Minion, MD;  Location: [place];  Service: Orthopedics;  Laterality: Right;   AMPUTATION Right 10/08/2019   Procedure: RIGHT FIFTH RAY AMPUTATION, ADJACENT TISSUE TRANSFER;  Surgeon: Evelina Bucy, DPM;  Location: [facility];  Service: Podiatry;  Laterality: Right;   AMPUTATION Right 07/27/2020   Procedure: AMPUTATION BELOW KNEE;  Surgeon: Wylene Simmer, MD;  Location: [place];  Service: Orthopedics;  Laterality: Right;  20mn   BIOPSY  08/12/2018   Procedure: BIOPSY;  Surgeon: GJackquline Denmark MD;  [facility];  Service: Gastroenterology;;   COLONOSCOPY WITH PROPOFOL N/A 08/14/2018   Procedure: COLONOSCOPY WITH PROPOFOL;  Surgeon: GJackquline Denmark MD;  [facility];  Service: Gastroenterology;  Laterality: N/A;   ESOPHAGOGASTRODUODENOSCOPY (EGD) WITH PROPOFOL N/A 08/12/2018   Procedure: ESOPHAGOGASTRODUODENOSCOPY (EGD) WITH PROPOFOL;  Surgeon: GJackquline Denmark MD;  [facility];   Service: Gastroenterology;  Laterality: N/A;   FLEXIBLE SIGMOIDOSCOPY N/A 11/09/2020   Procedure: FLEXIBLE SIGMOIDOSCOPY;  Surgeon: BThornton Park MD;  [facility];  Service: Gastroenterology;  Laterality: N/A;   GRAFT APPLICATION Right 63/53/2992  Procedure: SKIN GRAFT SUBSTITUTE APPLICATION;  Surgeon: PEvelina Bucy DPM;  Location: [facility];  Service: Podiatry;  Laterality: Right;   HERNIA REPAIR     HIATAL HERNIA REPAIR  1982   INCISION AND DRAINAGE ABSCESS N/A 12/08/2020   Procedure: INCISION AND DRAINAGE SACRAL DECUBITUS;  Surgeon: KKieth BrightlyLArta [place] MD;  Location: [facility];  Service: General;  Laterality: N/A;  439MIN   LEG SURGERY Bilateral ~ 1967   "legs were scissored; stretched them out"   WOUND DEBRIDEMENT Right 11/12/2019   Procedure: DEBRIDEMENT WOUND;  Surgeon: PEvelina Bucy DPM;  Location[facility];  Service: Podiatry;  Laterality: Right;    Social History:  reports that he has never smoked. He has never used smokeless tobacco. He reports that he does not drink alcohol and does not use drugs.  Family History:  Family History  Adopted: Yes  Problem Relation Age of Onset  Colon cancer Neg Hx      Prior to Admission medications   Medication Sig Start Date End Date Taking? Authorizing Provider  acetaminophen (TYLENOL) 500 MG tablet Take 500 mg by mouth every 6 (six) hours as needed (for aches, pains, and/or fevers).    [provider]  albuterol (VENTOLIN HFA) 108 (90 Base) MCG/ACT inhaler Inhale 2 puffs into the lungs every 4 (four) hours as needed for shortness of breath or wheezing. 01/11/20   [provider]  alum & mag hydroxide-simeth (MAALOX/MYLANTA) 200-200-20 MG/5ML suspension Take 15-30 mLs by mouth every 6 (six) hours as needed for indigestion or heartburn (or nausea).    [provider]  baclofen (LIORESAL) 20 MG tablet Take 1 tablet (20 mg total) by mouth 3 (three) times daily. 07/18/16   Kirsteins, Luanna Salk, MD   bisacodyl (DULCOLAX) 5 MG EC tablet Take 1 tablet (5 mg total) by mouth daily as needed for moderate constipation. 12/15/20   Annita Brod, MD  cetirizine (ZYRTEC) 10 MG tablet Take 1 tablet (10 mg total) by mouth daily. 11/01/14   Street, Sharon Mt, MD  diclofenac Sodium (VOLTAREN) 1 % GEL Apply 4 g topically See admin instructions. Apply 4 grams to painful areas four times a day 12/15/20   Annita Brod, MD  divalproex (DEPAKOTE SPRINKLE) 125 MG capsule Take 500 mg by mouth 2 (two) times daily. (0700 & 2000)    [provider]  Ensure (ENSURE) Take 237 mLs by mouth 2 (two) times daily between meals.    [provider]  FERREX 150 150 MG capsule Take 150 mg by mouth daily at 12 noon.    [provider]  fluticasone (FLONASE) 50 MCG/ACT nasal spray Place 1 spray into both nostrils daily. (0700) 12/21/19   [provider]  Foot Care Products (PREVALON HEEL PROTECTOR) MISC 1 Device by Does not apply route daily. 06/20/15   Verner Mould, MD  guaiFENesin-dextromethorphan [facility] DM) 100-10 MG/5ML syrup Take 10 mLs by mouth every 4 (four) hours as needed for cough.     [provider]  HYDROcodone-acetaminophen (NORCO/VICODIN) 5-325 MG tablet Take 1 tablet by mouth every 4 (four) hours as needed for severe pain. 12/15/20   Annita Brod, MD  lactulose (CHRONULAC) 10 GM/15ML solution Take 15 mLs (10 g total) by mouth daily. 12/16/20   Annita Brod, MD  liver oil-zinc oxide (DESITIN) 40 % ointment Apply 1 application topically 2 (two) times daily as needed (unspecified). 12/15/20   Annita Brod, MD  ondansetron (ZOFRAN) 4 MG tablet Take 1 tablet (4 mg total) by mouth every 6 (six) hours as needed for nausea. 08/03/20   Raiford Noble Latif, DO  polyethylene glycol powder (GLYCOLAX/MIRALAX) 17 GM/SCOOP powder Take 17 g by mouth See admin instructions. Mix 17 grams as directed every morning and once daily as needed for  constipation    [provider]  SANTYL ointment Apply 1 application topically See admin instructions. Apply to wound in the sacral area once a day    [provider]  tretinoin (RETIN-A) 0.025 % gel Apply 1 application topically See admin instructions. Apply to the affected areas of the face at bedtime    [provider]  omeprazole (PRILOSEC) 40 MG capsule Take 1 capsule (40 mg total) by mouth 2 (two) times daily. Patient not taking: Reported on 12/06/2020 08/15/18 12/15/20  Eugenie Filler, MD  [place] (OYSTER CALCIUM) 500 MG TABS tablet Take 1,500 mg  of elemental calcium by mouth daily. Patient not taking: Reported on 12/06/2020  12/15/20  [provider]    Physical Exam: Vitals:   02/20/21 1630 02/20/21 1700 02/20/21 1730 02/20/21 1800  BP: (!) 97/51 (!) 90/46 94/67 (!) 107/56  Pulse: 81 71 83 77  Resp: _0 Temp:      TempSrc:      SpO2: 96% 95% 98% 97%  Weight:       General: Not in acute distress HEENT:       Eyes: PERRL, EOMI, no scleral icterus.       ENT: No discharge from the ears and nose.        Neck: No JVD, no bruit, no mass felt. Heme: No neck lymph node enlargement. Cardiac: S1/S2, RRR, No murmurs, No gallops or rubs. Respiratory: No rales, wheezing, rhonchi or rubs. GI: Soft, nondistended, has diffused tenderness, no organomegaly, BS present. GU: No hematuria Ext: s/p of right BKA. Has swelling, erythema, tenderness, warmth in left leg. Musculoskeletal: No joint deformities, No joint redness or warmth, no limitation of ROM in spin. Skin: Has erythematous rashes in left leg, neck and face.  Has sacral wound without drainage.    Neuro: Alert, answers questions appropriately cranial nerves II-XII grossly intact. Quadriplegia except for use of left arm Psych: Patient is not psychotic, no suicidal or hemocidal ideation.  Labs on Admission: I have personally reviewed following labs and imaging studies  CBC: Recent  Labs  Lab 02/20/21 1215  WBC 17.6*  NEUTROABS 16.1*  HGB 13.3  HCT 39.8  MCV 74.7*  PLT 500   Basic Metabolic Panel: Recent Labs  Lab 02/20/21 1215 02/20/21 1432  NA 138  --   K 3.7  --   CL 101  --   CO2 25  --   GLUCOSE 132*  --   BUN 26*  --   CREATININE 0.73  --   CALCIUM 9.5  --   MG  --  1.8   GFR: Estimated Creatinine Clearance: 90.3 mL/min (by C-G formula based on SCr of 0.73 mg/dL). Liver Function Tests: Recent Labs  Lab 02/20/21 1215  AST 79*  ALT 131*  ALKPHOS 169*  BILITOT 0.7  PROT 7.9  ALBUMIN 3.1*   Recent Labs  Lab 02/20/21 1215  LIPASE 22   No results for input(s): AMMONIA in the last 168 hours. Coagulation Profile: Recent Labs  Lab 02/20/21 1215  INR 1.1   Cardiac Enzymes: No results for input(s): CKTOTAL, CKMB, CKMBINDEX, TROPONINI in the last 168 hours. BNP (last 3 results) No results for input(s): PROBNP in the last 8760 hours. HbA1C: No results for input(s): HGBA1C in the last 72 hours. CBG: No results for input(s): GLUCAP in the last 168 hours. Lipid Profile: No results for input(s): CHOL, HDL, LDLCALC, TRIG, CHOLHDL, LDLDIRECT in the last 72 hours. Thyroid Function Tests: No results for input(s): TSH, T4TOTAL, FREET4, T3FREE, THYROIDAB in the last 72 hours. Anemia Panel: No results for input(s): VITAMINB12, FOLATE, FERRITIN, TIBC, IRON, RETICCTPCT in the last 72 hours. Urine analysis:    Component Value Date/Time   COLORURINE YELLOW (A) 02/20/2021 1432   APPEARANCEUR CLEAR (A) 02/20/2021 1432   LABSPEC >1.046 (H) 02/20/2021 1432   PHURINE 6.0 02/20/2021 1432   GLUCOSEU NEGATIVE 02/20/2021 1432   HGBUR NEGATIVE 02/20/2021 1432   HGBUR negative 06/14/2010 1119   BILIRUBINUR NEGATIVE 02/20/2021 [place] 01/04/2016 1634   KETONESUR NEGATIVE 02/20/2021 1432   PROTEINUR  100 (A) 02/20/2021 1432   UROBILINOGEN 2.0 (H) 02/05/2018 1825   NITRITE NEGATIVE 02/20/2021 1432   LEUKOCYTESUR NEGATIVE 02/20/2021  1432   Sepsis Labs: _0 (procalcitonin:4,lacticidven:4) ) Recent Results (from the past 240 hour(s))  Resp Panel by RT-PCR (Flu A&B, Covid) Nasopharyngeal Swab     Status: Abnormal   Collection Time: 02/20/21  1:05 PM   Specimen: Nasopharyngeal Swab; Nasopharyngeal(NP) swabs in vial transport medium  Result Value Ref Range Status   SARS Coronavirus 2 by RT PCR POSITIVE (A) NEGATIVE Final    Comment: RESULT CALLED TO, READ BACK BY AND VERIFIED WITH: LEXI OLIVER 02/20/21 1424 KLW (NOTE) SARS-CoV-2 target nucleic acids are DETECTED.  The SARS-CoV-2 RNA is generally detectable in upper respiratory specimens during the acute phase of infection. Positive results are indicative of the presence of the identified virus, but do not rule out bacterial infection or co-infection with other pathogens not detected by the test. Clinical correlation with patient history and other diagnostic information is necessary to determine patient infection status. The expected result is Negative.  Fact Sheet for Patients: EntrepreneurPulse.com.au  Fact Sheet for Healthcare Providers: IncredibleEmployment.be  This test is not yet approved or cleared by the [place] FDA and  has been authorized for detection and/or diagnosis of SARS-CoV-2 by FDA under an Emergency Use Authorization (EUA).  This EUA will remain in effect (meaning this test can be used ) for the duration of  the COVID-19 declaration under Section 564(b)(1) of the Act, 21 U.S.C. section 360bbb-3(b)(1), unless the authorization is terminated or revoked sooner.     Influenza A by PCR NEGATIVE NEGATIVE Final   Influenza B by PCR NEGATIVE NEGATIVE Final    Comment: (NOTE) The Xpert Xpress SARS-CoV-2/FLU/RSV plus assay is intended as an aid in the diagnosis of influenza from Nasopharyngeal swab specimens and should not be used as a sole basis for treatment. Nasal washings and aspirates are  unacceptable for Xpert Xpress SARS-CoV-2/FLU/RSV testing.  Fact Sheet for Patients: EntrepreneurPulse.com.au  Fact Sheet for Healthcare Providers: IncredibleEmployment.be  This test is not yet approved or cleared by the [place] FDA and has been authorized for detection and/or diagnosis of SARS-CoV-2 by FDA under an Emergency Use Authorization (EUA). This EUA will remain in effect (meaning this test can be used) for the duration of the COVID-19 declaration under Section 564(b)(1) of the Act, 21 U.S.C. section 360bbb-3(b)(1), unless the authorization is terminated or revoked.  Performed at [facility], [address] [place], [place] [address]      Radiological Exams on Admission: CT Abdomen Pelvis W Contrast  Result Date: 02/20/2021 CLINICAL DATA:  Abdominal pain, acute, nonlocalized, fever EXAM: CT ABDOMEN AND PELVIS WITH CONTRAST TECHNIQUE: Multidetector CT imaging of the abdomen and pelvis was performed using the standard protocol following bolus administration of intravenous contrast. CONTRAST:  12m OMNIPAQUE IOHEXOL 350 MG/ML SOLN COMPARISON:  12/06/2020 FINDINGS: Lower chest: Small right pleural effusion with associated dependent atelectasis. Moderate-large hiatal hernia. Heart size is normal. Coronary artery calcification is noted. 1.4 cm ovoid low-density filling defect within the right atrium suspicious for thrombus. Hepatobiliary: There are a few small stones within the dependent portion of the gallbladder lumen. No pericholecystic inflammatory changes are evident. No suspicious liver abnormality. No biliary dilatation. Pancreas: Unremarkable. No pancreatic ductal dilatation or surrounding inflammatory changes. Spleen: Normal in size without focal abnormality. Adrenals/Urinary Tract: Unremarkable adrenal glands. Kidneys enhance symmetrically without focal lesion, stone, or hydronephrosis. Ureters are nondilated. Circumferential  wall thickening of the urinary bladder.  Stomach/Bowel: Moderate-large hiatal hernia. No dilated loops of bowel to suggest obstruction. Moderate volume of stool throughout the colon. Circumferential rectal wall thickening. Vascular/Lymphatic: No significant vascular findings are present. Prominent venous collaterals along the anterior pelvic wall. No enlarged abdominal or pelvic lymph nodes. Reproductive: Heterogeneously enlarged prostate gland. Other: Fluid containing right inguinal hernia. Suspect scrotal wall thickening and hydroceles, partially included at the edge of the field of view. No ascites. No pneumoperitoneum. Musculoskeletal: Chronic sacral decubitus ulcer without new bony destruction or evidence to suggest osteomyelitis. Multilevel bridging syndesmophytes within the visualized thoracic and upper lumbar spine. No acute osseous abnormality. IMPRESSION: 1. Low-attenuation 1.4 cm filling defect within the right atrium suspicious for thrombus. Recommend correlation with echocardiogram. 2. Circumferential wall thickening of the urinary bladder, which may represent cystitis. Correlation with urinalysis is recommended. 3. Chronic sacral decubitus ulcer without new bony destruction or evidence to suggest osteomyelitis. 4. Small right pleural effusion with associated dependent atelectasis. 5. Moderate-large hiatal hernia. 6. Cholelithiasis without evidence of acute cholecystitis. 7. Fluid containing right inguinal hernia. 8. Suspect scrotal wall thickening and hydroceles, partially included at the edge of the field of view. Correlate with physical exam. Electronically Signed   By: Davina Poke D.O.   On: 02/20/2021 14:45   DG Chest Port 1 View  Result Date: 02/20/2021 CLINICAL DATA:  Sepsis EXAM: PORTABLE CHEST 1 VIEW COMPARISON:  Chest radiograph 11/26/2020 FINDINGS: The patient is rotated to the right. The cardiomediastinal silhouette is suboptimally evaluated due to patient rotation. Opacity in the  medial left base likely reflects a hiatal hernia as seen on the prior chest CT. There are patchy opacities in the right lung. There is no definite right pleural effusion. The left lung is clear. There is no left pleural effusion. There is no definite pneumothorax. No acute osseous abnormality is identified. IMPRESSION: Suboptimal study due to rightward patient rotation. Patchy opacities in the right lung could reflect infection in the correct clinical setting. Electronically Signed   By: Valetta Mole M.D.   On: 02/20/2021 13:04     EKG: I have personally reviewed.  Sinus rhythm, QTC 431, diffuse T wave inversion, early R wave progression  Assessment/Plan Principal Problem:   Pneumonia due to COVID-19 virus Active Problems:   Pressure ulcer of foot   Abdominal pain   Cerebral palsy, quadriplegic (HCC)   Severe sepsis (HCC)   Iron deficiency anemia   Rash   Cellulitis of left leg   Obsessive compulsive disorder   Pneumonia due to COVID-19 virus: Patient has positive COVID-19 test, chest x-ray showed patchy right lung infiltration, but no oxygen desaturation.  Has mild cough.  -Admitted to progressive bed as inpatient -Started remdesivir -Bronchodilators -Solu-Medrol 40 mg twice daily which is also for treating rash -Follow-up inflammatory markers  Cellulitis of left leg:  -Vancomycin and cefepime -ESR, CRP -Follow-up lower extremity Doppler to rule out DVT  Severe sepsis: Patient meets criteria for severe sepsis with WBC 17.6, tachycardia with heart rate of 110, tachypnea with RR 21.  Lactic acid of 2.0, 6.8.  Initially hypotensive, which responded to IV fluid resuscitation.  Likely due to combination of COVID-19 pneumonia and left leg cellulitis. -Follow-up of blood culture and urine culture -will get Procalcitonin and trend lactic acid levels per sepsis protocol. -IVF: 3.5 LR bolus, followed by 100 cc/h   Abdominal pain: Etiology is not clear. Lipase is normal, CT abdomen/pelvis  did not show acute intra abdominal issues, but showed possible right atrial thrombus. -Supportive care -As  needed morphine and Zofran -IV fluid  Possible right atrial thrombus: 2D echo is ordered -Start IV heparin empirically -Follow-up 2D echo, if is negative will discontinue heparin  Pressure ulcer of foot, stage IV -Wound care consult  Cerebral palsy, quadriplegic ([place]) -continue home baclofen  Obsessive compulsive disorder -Depakote  Iron deficiency anemia: Hemoglobin 13.3 -Continue iron supplement  Rash -Solu-Medrol 40 mg twice daily -Benadryl    DVT ppx: on IV Heparin       Code Status: DNR per her sister Family Communication: I spoke with her sister who is POA by phone Disposition Plan:  Anticipate discharge back to previous environment Consults called:  none Admission status and Level of care: Progressive Cardiac:    progressive unit as inpt        Status is: Inpatient  Remains inpatient appropriate because:Inpatient level of care appropriate due to severity of illness  Dispo: The patient is from: SNF              Anticipated d/c is to: SNF              Patient currently is not medically stable to d/c.   Difficult to place patient No           Date of Service 02/20/2021    [facility]   If 7PM-7AM, please contact night-coverage [email or web address] 02/20/2021, 6:30 PM

## 2021-02-20 NOTE — Consult Note (Signed)
ANTICOAGULATION CONSULT NOTE - Initial Consult  Pharmacy Consult for Heparin Indication:  atrial thrombus  Allergies  Allergen Reactions   Adhesive [Tape] Rash    Patient Measurements: Weight: 65 kg (143 lb 4.8 oz) Heparin Dosing Weight: 65 kg  Vital Signs: Temp: 99.5 F (37.5 C) (09/27 1539) Temp Source: Oral (09/27 1539) BP: 112/75 (09/27 1600) Pulse Rate: 98 (09/27 1600)  Labs: Recent Labs    02/20/21 1215 02/20/21 1432  HGB 13.3  --   HCT 39.8  --   PLT 257  --   LABPROT 13.8  --   INR 1.1  --   CREATININE 0.73  --   TROPONINIHS  --  13    Estimated Creatinine Clearance: 90.3 mL/min (by C-G formula based on SCr of 0.73 mg/dL).   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   Anemia    microcytic anemia    Anxiety    takes Ativan daily as needed   Bipolar disorder (HCC)    takes Depakote daily   CEREBRAL PALSY 07/24/2006   Chronic bronchitis (HCC)    "gets it q yr"   Chronic bronchitis (HCC)    Chronic diabetic ulcer of foot determined by examination [facility])    Constipation    takes Miralax daily as needed   Depression    takes Risperdal nightly   Environmental allergies    takes Zyrtec daily   GASTRIC ULCER ACUTE WITH HEMORRHAGE 07/24/2006   Gastric ulcer with hemorrhage    hx of    GERD (gastroesophageal reflux disease)    takes Pravacid daily   History of blood transfusion    no abnormal reaction noted   History of hiatal hernia    History of shingles    History of stomach ulcers ~ 1996   Hypercholesterolemia    Intellectual functioning disability    Intermittent explosive disorder    Internal hemorrhoids    MRSA infection 09/06/2019   Obsessive compulsive disorder    Osteomyelitis (HCC)    right foot    Pneumonia "quite a few times"   in 2015 and in June 2016   Pressure ulcer of sacral region    hx of    Pseudomonas aeruginosa infection 09/06/2019   Psychotic disorder (HCC)    Recurrent  UTI (urinary tract infection)    Seborrheic dermatitis    Severe sepsis (HCC)    Sigmoid diverticulosis    Spastic tetraplegia (HCC)    URINARY INCONTINENCE 02/09/2010   takes Detrol daily-occasionally incontinence   Vitamin D deficiency    Weakness    numbness in extremities    Medications:  Scheduled:   ipratropium  2.5 mL Inhalation Q4H   methylPREDNISolone (SOLU-MEDROL) injection  80 mg Intravenous Q24H    Assessment: Pharmacy consulted to initiate heparin for possible right atrial thrombus.  Goal of Therapy Heparin level 0.3-0.7 units/ml Monitor platelets by anticoagulation protocol: Yes   Plan:  Give 4000 units bolus x 1 Start heparin infusion at 1000 units/hr Continue to monitor H&H and platelets Heparin level in 6 hours  Shweta Aman Rodriguez-Guzman PharmD, BCPS 02/20/2021 4:55 PM

## 2021-02-20 NOTE — Progress Notes (Signed)
Remdesivir - Pharmacy Brief Note   O:  ALT: 131 CXR: There are patchy opacities in the right lung SpO2: 99% on 9/27 @ 1439   A/P:  Remdesivir 200 mg IVPB once followed by 100 mg IVPB daily x 4 days.   Pamelia Botto Rodriguez-Guzman PharmD, BCPS 02/20/2021 3:21 PM

## 2021-02-20 NOTE — Consult Note (Signed)
CODE SEPSIS - PHARMACY COMMUNICATION  **Broad Spectrum Antibiotics should be administered within 1 hour of Sepsis diagnosis**  Time Code Sepsis Called/Page Received: 1330  Antibiotics Ordered:  Cefepime Metronidazole Vancomycin  Time of 1st antibiotic administration: 1335  Additional action taken by pharmacy: none  If necessary, Name of Provider/Nurse Contacted: n/a    Delynn Pursley Rodriguez-Guzman PharmD, BCPS 02/20/2021 2:02 PM

## 2021-02-20 NOTE — Progress Notes (Signed)
Pharmacy Antibiotic Note  [name] is a 60 y.o. male admitted on 02/20/2021 with sepsis and HCAP .  Pharmacy has been consulted for Cefepime and Vancomycin dosing.  Plan: Cefepime 2gm every 8 hours Vancomycin 1000 mg loading dose, then 750 IV Q 12 hrs.  Goal AUC 400-550. Expected AUC: 404 SCr used: 0.8   Weight: 65 kg (143 lb 4.8 oz)  Temp (24hrs), Avg:100.8 F (38.2 C), Min:99.1 F (37.3 C), Max:103.3 F (39.6 C)  Recent Labs  Lab 02/20/21 1215 02/20/21 1432 02/20/21 1642  WBC 17.6*  --   --   CREATININE 0.73  --   --   LATICACIDVEN 2.0* 6.8* 2.1*    Estimated Creatinine Clearance: 90.3 mL/min (by C-G formula based on SCr of 0.73 mg/dL).    Allergies  Allergen Reactions   Adhesive [Tape] Rash    Antimicrobials this admission: 9/27 metronidazole x 1 9/27 cefepime >>  9/27 vancomycin >>  9/27 Remsedivir >>   Microbiology results: 9/27 BCx: pending 9/27 COVID RT-PCR: positive  Thank you for allowing pharmacy to be a part of this patient's care. Zaida Reiland Rodriguez-Guzman PharmD, BCPS 02/20/2021 5:17 PM

## 2021-02-20 NOTE — ED Notes (Addendum)
Sister Monna Fam notified of planned admission and updated on pt condition and plan of care, pt spoke with sister per his/her request.

## 2021-02-20 NOTE — ED Triage Notes (Signed)
Per EMS, PT with N/V since this morning and rash, temp of 103 en route. Pt is AOX4, NAD noted. Red flat rash noted on face and upper legs.

## 2021-02-20 NOTE — Progress Notes (Signed)
PHARMACY -  BRIEF ANTIBIOTIC NOTE   Pharmacy has received consult(s) for Vancomycin and Cefepime from an ED provider.  The patient's profile has been reviewed for ht/wt/allergies/indication/available labs.    One time order(s) placed for  Vancomycin and Cefepime  Further antibiotics/pharmacy consults should be ordered by admitting physician if indicated.                       Thank you, Shaunn Tackitt Rodriguez-Guzman PharmD, BCPS 02/20/2021 2:04 PM

## 2021-02-20 NOTE — ED Provider Notes (Signed)
[facility] Emergency Department Provider Note   ____________________________________________   Event Date/Time   First MD Initiated Contact with Patient 02/20/21 1150     (approximate)  I have reviewed the triage vital signs and the nursing notes.   HISTORY  Chief Complaint Emesis and Fever    HPI [name] is a 60 y.o. male with past medical history of cerebral palsy, bipolar disorder, DVT and quadriplegia status post right BKA who presents to the ED for nausea and vomiting.  Patient currently resides at [facility] house and staff there is reports that he started feeling nauseous with multiple episodes of vomiting since this morning.  Patient endorses diffuse abdominal pain and is requesting something to help "settle my stomach."  Patient reports dealing with chronic rash to his left leg and trunk, staff is concerned for worsening of this rash affecting his neck and face.  EMS reports patient is at his baseline mental status, able to answer questions appropriately.  EMS noted temperature of 103.3 axillary during transport.        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   Anemia    microcytic anemia    Anxiety    takes Ativan daily as needed   Bipolar disorder (HCC)    takes Depakote daily   CEREBRAL PALSY 07/24/2006   Chronic bronchitis (HCC)    "gets it q yr"   Chronic bronchitis (HCC)    Chronic diabetic ulcer of foot determined by examination [facility])    Constipation    takes Miralax daily as needed   Depression    takes Risperdal nightly   Environmental allergies    takes Zyrtec daily   GASTRIC ULCER ACUTE WITH HEMORRHAGE 07/24/2006   Gastric ulcer with hemorrhage    hx of    GERD (gastroesophageal reflux disease)    takes Pravacid daily   History of blood transfusion    no abnormal reaction noted   History of hiatal hernia    History of shingles    History of stomach ulcers ~ 1996    Hypercholesterolemia    Intellectual functioning disability    Intermittent explosive disorder    Internal hemorrhoids    MRSA infection 09/06/2019   Obsessive compulsive disorder    Osteomyelitis (HCC)    right foot    Pneumonia "quite a few times"   in 2015 and in June 2016   Pressure ulcer of sacral region    hx of    Pseudomonas aeruginosa infection 09/06/2019   Psychotic disorder (HCC)    Recurrent UTI (urinary tract infection)    Seborrheic dermatitis    Sigmoid diverticulosis    Spastic tetraplegia (HCC)    URINARY INCONTINENCE 02/09/2010   takes Detrol daily-occasionally incontinence   Vitamin D deficiency    Weakness    numbness in extremities    Patient Active Problem List   Diagnosis Date Noted   Cellulitis of sacral region 12/06/2020   Right pulmonary infiltrate on CXR 10/03/2020   Dysphagia 10/03/2020   GERD without esophagitis 10/03/2020   Aspiration pneumonia (HCC) 10/03/2020   Recurrent right pleural effusion 10/02/2020   Abnormal CT scan, colon    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   Nausea and vomiting 07/31/2020   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) 10/08/2019   Osteomyelitis (HCC) 10/08/2019   Acute osteomyelitis of right ankle or foot (HCC)    Ulcerated, foot, right, with necrosis  of bone (HCC)    Pseudomonas aeruginosa infection 09/06/2019   MRSA infection 09/06/2019   Lactic acidosis    Stage 3 skin ulcer of sacral region (HCC) 08/10/2018   Gastric wall thickening 08/10/2018   Alkaline phosphatase elevation 08/10/2018   Cholelithiasis 08/10/2018   Microcytic anemia    Hiatal hernia 04/07/2018   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) 01/20/2016   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/15/2015   Cerebral palsy, quadriplegic (HCC)    Pre-diabetes 08/31/2015   Foot ulcer, limited to breakdown of skin (HCC) 06/08/2015   Seasonal allergies 11/01/2014   Abdominal pain    Pressure ulcer of foot 03/14/2014   CAP (community  acquired pneumonia) 03/13/2014   Decubitus ulcer of left hip, stage 3 (HCC) 01/08/2013   URINARY INCONTINENCE 02/09/2010   Intellectual disability 07/24/2006   Infantile cerebral palsy (HCC) 07/24/2006    Past Surgical History:  Procedure Laterality Date   AMPUTATION Right 12/14/2014   Procedure: AMPUTATION DIGIT RIGHT GREAT TOE MTP;  Surgeon: Nadara Mustard, MD;  Location: [facility] OR;  Service: Orthopedics;  Laterality: Right;   AMPUTATION Right 10/08/2019   Procedure: RIGHT FIFTH RAY AMPUTATION, ADJACENT TISSUE TRANSFER;  Surgeon: Park Liter, DPM;  Location: [facility];  Service: Podiatry;  Laterality: Right;   AMPUTATION Right 07/27/2020   Procedure: AMPUTATION BELOW KNEE;  Surgeon: Toni Arthurs, MD;  Location: [facility] OR;  Service: Orthopedics;  Laterality: Right;    BIOPSY  08/12/2018   Procedure: BIOPSY;  Surgeon: Lynann Bologna, MD;  Location: [facility];  Service: Gastroenterology;;   COLONOSCOPY WITH PROPOFOL N/A 08/14/2018   Procedure: COLONOSCOPY WITH PROPOFOL;  Surgeon: Lynann Bologna, MD;  Location: [facility];  Service: Gastroenterology;  Laterality: N/A;   ESOPHAGOGASTRODUODENOSCOPY (EGD) WITH PROPOFOL N/A 08/12/2018   Procedure: ESOPHAGOGASTRODUODENOSCOPY (EGD) WITH PROPOFOL;  Surgeon: Lynann Bologna, MD;  Location: [facility];  Service: Gastroenterology;  Laterality: N/A;   FLEXIBLE SIGMOIDOSCOPY N/A 11/09/2020   Procedure: FLEXIBLE SIGMOIDOSCOPY;  Surgeon: [name], MD;  Location: [facility];  Service: Gastroenterology;  Laterality: N/A;   GRAFT APPLICATION Right 11/12/2019   Procedure: SKIN GRAFT SUBSTITUTE APPLICATION;  Surgeon: Park Liter, DPM;  Location: [facility];  Service: Podiatry;  Laterality: Right;   HERNIA REPAIR     HIATAL HERNIA REPAIR  1982   INCISION AND DRAINAGE ABSCESS N/A 12/08/2020   Procedure: INCISION AND DRAINAGE SACRAL DECUBITUS;  Surgeon: Sheliah Hatch De Blanch, MD;  Location: [facility];  Service: General;  Laterality: N/A;  45 MIN   LEG SURGERY  Bilateral ~ 1967   "legs were scissored; stretched them out"   WOUND DEBRIDEMENT Right 11/12/2019   Procedure: DEBRIDEMENT WOUND;  Surgeon: Park Liter, DPM;  Location: [facility];  Service: Podiatry;  Laterality: Right;    Prior to Admission medications   Medication Sig Start Date End Date Taking? Authorizing Provider  acetaminophen (TYLENOL) 500 MG tablet Take 500 mg by mouth every 6 (six) hours as needed (for aches, pains, and/or fevers).    [provider]  albuterol (VENTOLIN HFA) 108 (90 Base) MCG/ACT inhaler Inhale 2 puffs into the lungs every 4 (four) hours as needed for shortness of breath or wheezing. 01/11/20   [provider]  alum & mag hydroxide-simeth (MAALOX/MYLANTA) 200-200-20 MG/5ML suspension Take 15-30 mLs by mouth every 6 (six) hours as needed for indigestion or heartburn (or nausea).    [provider]  baclofen (LIORESAL) 20 MG tablet Take 1 tablet (20 mg total) by mouth 3 (  three) times daily. 07/18/16   Kirsteins, Victorino Sparrow, MD  bisacodyl (DULCOLAX) 5 MG EC tablet Take 1 tablet (5 mg total) by mouth daily as needed for moderate constipation. 12/15/20   Hollice Espy, MD  cetirizine (ZYRTEC) 10 MG tablet Take 1 tablet (10 mg total) by mouth daily. 11/01/14   Street, Stephanie Coup, MD  diclofenac Sodium (VOLTAREN) 1 % GEL Apply 4 g topically See admin instructions. Apply 4 grams to painful areas four times a day 12/15/20   Hollice Espy, MD  divalproex (DEPAKOTE SPRINKLE) 125 MG capsule Take 500 mg by mouth 2 (two) times daily. (0700 & 2000)    [provider]  Ensure (ENSURE) Take 237 mLs by mouth 2 (two) times daily between meals.    [provider]  FERREX 150 150 MG capsule Take 150 mg by mouth daily at 12 noon.    [provider]  fluticasone (FLONASE) 50 MCG/ACT nasal spray Place 1 spray into both nostrils daily. (0700) 12/21/19   [provider]  Foot Care Products (PREVALON HEEL PROTECTOR) MISC 1 Device  by Does not apply route daily. 06/20/15   Marquette Saa, MD  guaiFENesin-dextromethorphan [facility] DM) 100-10 MG/5ML syrup Take 10 mLs by mouth every 4 (four) hours as needed for cough.     [provider]  HYDROcodone-acetaminophen (NORCO/VICODIN) 5-325 MG tablet Take 1 tablet by mouth every 4 (four) hours as needed for severe pain. 12/15/20   Hollice Espy, MD  lactulose (CHRONULAC) 10 GM/15ML solution Take 15 mLs (10 g total) by mouth daily. 12/16/20   Hollice Espy, MD  liver oil-zinc oxide (DESITIN) 40 % ointment Apply 1 application topically 2 (two) times daily as needed (unspecified). 12/15/20   Hollice Espy, MD  ondansetron (ZOFRAN) 4 MG tablet Take 1 tablet (4 mg total) by mouth every 6 (six) hours as needed for nausea. 08/03/20   Marguerita Merles Latif, DO  polyethylene glycol powder (GLYCOLAX/MIRALAX) 17 GM/SCOOP powder Take 17 g by mouth See admin instructions. Mix 17 grams as directed every morning and once daily as needed for constipation    [provider]  SANTYL ointment Apply 1 application topically See admin instructions. Apply to wound in the sacral area once a day    [provider]  tretinoin (RETIN-A) 0.025 % gel Apply 1 application topically See admin instructions. Apply to the affected areas of the face at bedtime    [provider]  omeprazole (PRILOSEC) 40 MG capsule Take 1 capsule (40 mg total) by mouth 2 (two) times daily. Patient not taking: Reported on 12/06/2020 08/15/18 12/15/20  Rodolph Bong, MD  Ethelda Chick (OYSTER CALCIUM) 500 MG TABS tablet Take 1,500 mg of elemental calcium by mouth daily. Patient not taking: Reported on 12/06/2020  12/15/20  [provider]    Allergies Adhesive [tape]  Family History  Adopted: Yes  Problem Relation Age of Onset   Colon cancer Neg Hx     Social History Social History   Tobacco Use   Smoking status: Never   Smokeless tobacco: Never  Substance Use  Topics   Alcohol use: No   Drug use: No    Review of Systems  Constitutional: Positive for fever/chills Eyes: No visual changes. ENT: No sore throat. Cardiovascular: Denies chest pain. Respiratory: Denies shortness of breath.  Positive for cough. Gastrointestinal: Positive for abdominal pain, nausea, and vomiting.  No diarrhea.  No constipation. Genitourinary: Negative for dysuria. Musculoskeletal: Negative for back  pain. Skin: Positive for rash. Neurological: Negative for headaches, focal weakness or numbness.  ____________________________________________   PHYSICAL EXAM:  VITAL SIGNS: ED Triage Vitals  Enc Vitals Group     BP      Pulse      Resp      Temp      Temp src      SpO2      Weight      Height      Head Circumference      Peak Flow      Pain Score      Pain Loc      Pain Edu?      Excl. in GC?     Constitutional: Alert and oriented. Eyes: Conjunctivae are normal. Head: Atraumatic. Nose: No congestion/rhinnorhea. Mouth/Throat: Mucous membranes are dry. Neck: Normal ROM Cardiovascular: Tachycardic, regular rhythm. Grossly normal heart sounds.  2+ radial pulses bilaterally. Respiratory: Normal respiratory effort.  No retractions. Lungs CTAB. Gastrointestinal: Soft and diffusely tender to palpation with no rebound or guarding. No distention. Genitourinary: deferred Musculoskeletal: Status post right BKA.  No lower extremity tenderness nor edema.  Healing sacral decubitus ulcer with no erythema, warmth, or drainage noted. Neurologic:  Normal speech and language.  Patient quadriplegic. Skin:  Skin is warm, dry and intact.  Patchy erythema noted to left lower extremity as well as left neck and face with scaly rash noted to neck and lower face.  No desquamation noted. Psychiatric: Mood and affect are normal. Speech and behavior are normal.  ____________________________________________   LABS (all labs ordered are listed, but only abnormal results are  displayed)  Labs Reviewed  CULTURE, BLOOD (ROUTINE X 2)  CULTURE, BLOOD (ROUTINE X 2)  RESP PANEL BY RT-PCR (FLU A&B, COVID) [id number]  COMPREHENSIVE METABOLIC PANEL  LACTIC ACID, PLASMA  LACTIC ACID, PLASMA  CBC WITH DIFFERENTIAL/PLATELET  PROTIME-INR  URINALYSIS, COMPLETE (UACMP) WITH MICROSCOPIC  LIPASE, BLOOD  TROPONIN I (HIGH SENSITIVITY)   ____________________________________________  EKG  ED ECG REPORT I, Chesley Noon, the attending physician, personally viewed and interpreted this ECG.   Date: 02/20/2021  EKG Time: 13:20  Rate: 97  Rhythm: normal sinus rhythm  Axis: Normal  Intervals:none  ST&T Change: Nonspecific ST/T changes   PROCEDURES  Procedure(s) performed (including Critical Care):  .Critical Care Performed by: Chesley Noon, MD Authorized by: Chesley Noon, MD   Critical care provider statement:    Critical care time (minutes):  45   Critical care time was exclusive of:  Separately billable procedures and treating other patients and teaching time   Critical care was necessary to treat or prevent imminent or life-threatening deterioration of the following conditions:  Sepsis   Critical care was time spent personally by me on the following activities:  Discussions with consultants, evaluation of patient's response to treatment, examination of patient, ordering and performing treatments and interventions, ordering and review of laboratory studies, ordering and review of radiographic studies, pulse oximetry, re-evaluation of patient's condition, obtaining history from patient or surrogate and review of old charts   I assumed direction of critical care for this patient from another provider in my specialty: no     Care discussed with: admitting provider     ____________________________________________   INITIAL IMPRESSION / ASSESSMENT AND PLAN / ED COURSE      60 year old male with past medical history of cerebral palsy, bipolar disorder, DVT, and  quadriplegia status post right BKA who presents to the ED with nausea, vomiting, abdominal pain, cough,  and fever starting earlier today.  Patient noted to be febrile by EMS and is mildly tachycardic, we will initiate sepsis work-up and further assess for pneumonia versus UTI versus intra-abdominal infection.  Given his abdominal tenderness we will further assess with CT scan.  Patient does have extensive rash that he states is chronic but facility had reported to be worsening.  This rash does not appear consistent with cellulitis and there is no evidence of infection related to his sacral ulcer.  Rash is not desquamating and I doubt SJS or TEN.  Patient with borderline low blood pressures, however MAP maintaining greater than 65 and we will continue IV fluid hydration given his elevated lactic acid.  Testing for COVID-19 is positive but I remain concerned for bacterial source of sepsis and patient given broad-spectrum antibiotics given his fever and leukocytosis.  Chest x-ray reviewed by me and shows questionable right-sided infiltrate, although evaluation limited due to patient positioning.  CT results and UA are pending, case turned over to oncoming provider pending results and admission.      ____________________________________________   FINAL CLINICAL IMPRESSION(S) / ED DIAGNOSES  Final diagnoses:  Sepsis [facility])     ED Discharge Orders     None        Note:  This document was prepared using Dragon voice recognition software and may include unintentional dictation errors.    Chesley Noon, MD 02/20/21 [phone]

## 2021-02-20 NOTE — Sepsis Progress Note (Signed)
Requested third LA from provider, see new orders

## 2021-02-21 ENCOUNTER — Inpatient Hospital Stay (HOSPITAL_COMMUNITY)
Admit: 2021-02-21 | Discharge: 2021-02-21 | Disposition: A | Discharge status: Home / Self Care | Payer: Medicare Other | Attending: Internal Medicine | Admitting: Internal Medicine

## 2021-02-21 DIAGNOSIS — R008 Other abnormalities of heart beat: Secondary | ICD-10-CM

## 2021-02-21 LAB — BLOOD CULTURE ID PANEL (REFLEXED) - BCID2

## 2021-02-21 LAB — CBC WITH DIFFERENTIAL/PLATELET
Abs Immature Granulocytes: 0.13 K/uL — ABNORMAL HIGH (ref 0.00–0.07)
Basophils Absolute: 0 K/uL (ref 0.0–0.1)
Basophils Relative: 0 %
Eosinophils Absolute: 0 K/uL (ref 0.0–0.5)
Eosinophils Relative: 0 %
HCT: 33.9 % — ABNORMAL LOW (ref 39.0–52.0)
Hemoglobin: 11.3 g/dL — ABNORMAL LOW (ref 13.0–17.0)
Immature Granulocytes: 1 %
Lymphocytes Relative: 8 %
Lymphs Abs: 1 K/uL (ref 0.7–4.0)
MCH: 25 pg — ABNORMAL LOW (ref 26.0–34.0)
MCHC: 33.3 g/dL (ref 30.0–36.0)
MCV: 75 fL — ABNORMAL LOW (ref 80.0–100.0)
Monocytes Absolute: 0.4 K/uL (ref 0.1–1.0)
Monocytes Relative: 3 %
Neutro Abs: 10.9 K/uL — ABNORMAL HIGH (ref 1.7–7.7)
Neutrophils Relative %: 88 %
Platelets: 234 K/uL (ref 150–400)
RBC: 4.52 MIL/uL (ref 4.22–5.81)
RDW: 20.3 % — ABNORMAL HIGH (ref 11.5–15.5)
WBC: 12.4 K/uL — ABNORMAL HIGH (ref 4.0–10.5)
nRBC: 0.3 % — ABNORMAL HIGH (ref 0.0–0.2)

## 2021-02-21 LAB — COMPREHENSIVE METABOLIC PANEL
ALT: 77 U/L — ABNORMAL HIGH (ref 0–44)
AST: 37 U/L (ref 15–41)
Albumin: 2.4 g/dL — ABNORMAL LOW (ref 3.5–5.0)
Alkaline Phosphatase: 118 U/L (ref 38–126)
Anion gap: 10 (ref 5–15)
BUN: 22 mg/dL — ABNORMAL HIGH (ref 6–20)
CO2: 24 mmol/L (ref 22–32)
Calcium: 9 mg/dL (ref 8.9–10.3)
Chloride: 104 mmol/L (ref 98–111)
Creatinine, Ser: 0.5 mg/dL — ABNORMAL LOW (ref 0.61–1.24)
GFR, Estimated: >60 mL/min (ref >60)
Glucose, Bld: 128 mg/dL — ABNORMAL HIGH (ref 70–99)
Potassium: 4.1 mmol/L (ref 3.5–5.1)
Sodium: 138 mmol/L (ref 135–145)
Total Bilirubin: 0.6 mg/dL (ref 0.3–1.2)
Total Protein: 6.4 g/dL — ABNORMAL LOW (ref 6.5–8.1)

## 2021-02-21 LAB — HEPARIN LEVEL (UNFRACTIONATED)
Heparin Unfractionated: 0.43 IU/mL (ref 0.30–0.70)
Heparin Unfractionated: 0.3 IU/mL (ref 0.30–0.70)

## 2021-02-21 LAB — ECHOCARDIOGRAM COMPLETE
AR max vel: 2.88 cm2
AV Area VTI: 2.93 cm2
AV Area mean vel: 2.63 cm2
AV Mean grad: 2 mmHg
AV Peak grad: 3.8 mmHg
Ao pk vel: 0.98 m/s
Area-P 1/2: 4.06 cm2
S' Lateral: 1.75 cm
Weight: 2292.78 oz

## 2021-02-21 LAB — D-DIMER, QUANTITATIVE: D-Dimer, Quant: 1.32 ug/mL-FEU — ABNORMAL HIGH (ref 0.00–0.50)

## 2021-02-21 LAB — URINE CULTURE: Culture: NO GROWTH

## 2021-02-21 LAB — FERRITIN: Ferritin: 190 ng/mL (ref 24–336)

## 2021-02-21 LAB — APTT: aPTT: 118 seconds — ABNORMAL HIGH (ref 24–36)

## 2021-02-21 LAB — HIV ANTIBODY (ROUTINE TESTING W REFLEX): HIV Screen 4th Generation wRfx: NONREACTIVE

## 2021-02-21 LAB — PROCALCITONIN: Procalcitonin: 0.52 ng/mL

## 2021-02-21 LAB — C-REACTIVE PROTEIN: CRP: 37 mg/dL — ABNORMAL HIGH (ref <1.0)

## 2021-02-21 LAB — STREP PNEUMONIAE URINARY ANTIGEN: Strep Pneumo Urinary Antigen: NEGATIVE

## 2021-02-21 LAB — SEDIMENTATION RATE: Sed Rate: 79 mm/hr — ABNORMAL HIGH (ref 0–20)

## 2021-02-21 MED ORDER — IPRATROPIUM BROMIDE HFA 17 MCG/ACT IN AERS
2.0000 | INHALATION_SPRAY | RESPIRATORY_TRACT | Status: DC
  Administered 2021-02-22 – 2021-03-01 (×38): 2 via RESPIRATORY_TRACT
  Filled 2021-02-21 (×2): qty 12.9

## 2021-02-21 MED ORDER — HYDROCODONE-ACETAMINOPHEN 5-325 MG PO TABS
1.0000 | ORAL_TABLET | ORAL | Status: DC | PRN
  Administered 2021-02-22 – 2021-03-03 (×3): 1 via ORAL
  Filled 2021-02-21 (×3): qty 1

## 2021-02-21 MED ORDER — ENSURE PO LIQD
237.0000 mL | Freq: Two times a day (BID) | ORAL | Status: DC
  Administered 2021-02-23 – 2021-02-26 (×8): 237 mL via ORAL
  Filled 2021-02-21: qty 237

## 2021-02-21 MED ORDER — TRETINOIN 0.025 % EX GEL: 1.0000 "application " | CUTANEOUS | Status: DC

## 2021-02-21 MED ORDER — DIVALPROEX SODIUM 125 MG PO CSDR
125.0000 mg | DELAYED_RELEASE_CAPSULE | Freq: Two times a day (BID) | ORAL | Status: DC
  Administered 2021-02-21 – 2021-03-03 (×21): 125 mg via ORAL
  Filled 2021-02-21 (×23): qty 1

## 2021-02-21 MED ORDER — POLYSACCHARIDE IRON COMPLEX 150 MG PO CAPS
150.0000 mg | ORAL_CAPSULE | Freq: Every day | ORAL | Status: DC
  Administered 2021-02-21 – 2021-03-03 (×11): 150 mg via ORAL
  Filled 2021-02-21 (×11): qty 1

## 2021-02-21 MED ORDER — SODIUM CHLORIDE 0.9 % IV SOLN
2.0000 g | INTRAVENOUS | Status: DC
  Administered 2021-02-21 – 2021-02-22 (×2): 2 g via INTRAVENOUS
  Filled 2021-02-21 (×2): qty 20

## 2021-02-21 MED ORDER — DICLOFENAC SODIUM 1 % EX GEL
4.0000 g | Freq: Four times a day (QID) | CUTANEOUS | Status: DC | PRN
  Filled 2021-02-21: qty 100

## 2021-02-21 MED ORDER — BISACODYL 5 MG PO TBEC: 5.0000 mg | DELAYED_RELEASE_TABLET | Freq: Every day | ORAL | Status: DC | PRN

## 2021-02-21 MED ORDER — POLYETHYLENE GLYCOL 3350 17 G PO PACK: 17.0000 g | PACK | Freq: Every day | ORAL | Status: DC | PRN

## 2021-02-21 MED ORDER — AEROCHAMBER PLUS FLO-VU LARGE MISC
1.0000 | Freq: Once | Status: AC
  Administered 2021-02-22: 1
  Filled 2021-02-21: qty 1

## 2021-02-21 MED ORDER — ALUM & MAG HYDROXIDE-SIMETH 200-200-20 MG/5ML PO SUSP: 15.0000 mL | Freq: Four times a day (QID) | ORAL | Status: DC | PRN

## 2021-02-21 MED ORDER — ZINC OXIDE 40 % EX OINT
1.0000 "application " | TOPICAL_OINTMENT | Freq: Two times a day (BID) | CUTANEOUS | Status: DC | PRN
  Filled 2021-02-21: qty 113

## 2021-02-21 MED ORDER — LACTULOSE 10 GM/15ML PO SOLN
10.0000 g | Freq: Every day | ORAL | Status: DC
  Administered 2021-02-21 – 2021-03-03 (×11): 10 g via ORAL
  Filled 2021-02-21 (×11): qty 30

## 2021-02-21 MED ORDER — LORATADINE 10 MG PO TABS
10.0000 mg | ORAL_TABLET | Freq: Every day | ORAL | Status: DC
  Administered 2021-02-22 – 2021-03-03 (×10): 10 mg via ORAL
  Filled 2021-02-21 (×10): qty 1

## 2021-02-21 NOTE — Progress Notes (Signed)
ANTICOAGULATION CONSULT NOTE - Follow Up Consult  Pharmacy Consult for Heparin infusion Indication:  atrial thrombus  Allergies  Allergen Reactions   Adhesive [Tape] Rash    Patient Measurements: Weight: 65 kg (143 lb 4.8 oz) Heparin Dosing Weight: 65 kg  Vital Signs: BP: 117/75 (09/28 0700) Pulse Rate: 89 (09/28 0700)  Labs: Recent Labs    02/20/21 1215 02/20/21 1432 02/21/21 0301 02/21/21 0654 02/21/21 1035  HGB 13.3  --   --  11.3*  --   HCT 39.8  --   --  33.9*  --   PLT 257  --   --  234  --   APTT  --   --  118*  --   --   LABPROT 13.8  --   --   --   --   INR 1.1  --   --   --   --   HEPARINUNFRC  --   --  0.43  --  0.30  CREATININE 0.73  --   --  0.50*  --   TROPONINIHS  --  13  --   --   --     Estimated Creatinine Clearance: 90.3 mL/min (A) (by C-G formula based on SCr of 0.5 mg/dL (L)).   Medications:  Scheduled:   divalproex  125 mg Oral BID   Ensure  237 mL Oral BID BM   ipratropium  2.5 mL Inhalation Q4H   iron polysaccharides  150 mg Oral Q1200   lactulose  10 g Oral Daily   loratadine  10 mg Oral Daily   methylPREDNISolone (SOLU-MEDROL) injection  80 mg Intravenous Q24H   tretinoin  1 application Topical See admin instructions    Assessment: PMH relevant for cerebral palsy with quadriplegia, right BKA, chronic sacral wound, hyperlipidemia, bipolar disorder, gastric ulcer with bleeding, iron deficiency anemia, aggressive compulsive disorder, hx of DVT not on AC. Pharmacy was consulted to manage heparin infusion for atrial thrombus  Goal of Therapy:  Heparin level 0.3-0.7 units/ml Monitor platelets by anticoagulation protocol: Yes    9/28 @ 0301  HL = 0.43, therapeutic 2 1   9/28 @ 1112  HL = 0.30, therapeutic x 2  Plan:  Continue heparin infusion at 1000 units/hr Check anti-Xa level with morning labs Continue to monitor H&H and platelets  Latania Bascomb Rodriguez-Guzman PharmD, BCPS 02/21/2021 11:32 AM

## 2021-02-21 NOTE — Progress Notes (Signed)
*  PRELIMINARY RESULTS* Echocardiogram 2D Echocardiogram has been performed.  [name] 02/21/2021, 12:38 PM

## 2021-02-21 NOTE — Progress Notes (Signed)
I&O cathed patient per Dr. Butler Denmark verbal order for patient stating unable to void. Bladder scan was >695mL, I&O cath with output. Urine sample sent to lab per orders.

## 2021-02-21 NOTE — Plan of Care (Signed)
  Problem: Education: Goal: Knowledge of General Education information will improve Description: Including pain rating scale, medication(s)/side effects and non-pharmacologic comfort measures Outcome: Progressing   Problem: Clinical Measurements: Goal: Ability to maintain clinical measurements within normal limits will improve Outcome: Progressing Goal: Will remain free from infection Outcome: Progressing Goal: Diagnostic test results will improve Outcome: Progressing Goal: Respiratory complications will improve Outcome: Progressing Goal: Cardiovascular complication will be avoided Outcome: Progressing   Problem: Activity: Goal: Risk for activity intolerance will decrease Outcome: Progressing   Problem: Nutrition: Goal: Adequate nutrition will be maintained Outcome: Progressing   Problem: Coping: Goal: Level of anxiety will decrease Outcome: Progressing   Problem: Elimination: Goal: Will not experience complications related to bowel motility Outcome: Progressing Goal: Will not experience complications related to urinary retention Outcome: Progressing   Problem: Pain Managment: Goal: General experience of comfort will improve Outcome: Progressing   Problem: Safety: Goal: Ability to remain free from injury will improve Outcome: Progressing   Problem: Skin Integrity: Goal: Risk for impaired skin integrity will decrease Outcome: Progressing   Problem: Clinical Measurements: Goal: Ability to avoid or minimize complications of infection will improve Outcome: Progressing   Problem: Skin Integrity: Goal: Skin integrity will improve Outcome: Progressing   

## 2021-02-21 NOTE — Consult Note (Signed)
WOC Nurse wound consult note Consultation was completed by review of records, images and assistance from the bedside nurse/clinical staff.   Reason for Consult:sacral pressure injury Patient admitted in July 2022 with unstageable pressure injury that was subsequently debrided and treated with topical care.  Patient has been in SNF and wound has significantly improved. Wound type: Stage 3 Sacral Pressure Injury Pressure Injury POA: Yes Measurement: see nursing flow sheet; will occur with admission to floor Wound bed:100% pink, clean, pale Drainage (amount, consistency, odor) minimal noted on dressing in image Periwound:epibole of wound edges noted in previous images, still present Dressing procedure/placement/frequency:  Silver hydrofiber packing for possible bioburdan and absorb exudate. Top with foam. Change every other day.  Low air loss mattress for pressure redistribution and moisture management.    Re consult if needed, will not follow at this time. Thanks  Matraca Hunkins [employer], RN,CWOCN, CNS, CWON-AP [phone])

## 2021-02-21 NOTE — ED Notes (Signed)
Informed RN bed assigned 

## 2021-02-21 NOTE — Consult Note (Signed)
ANTICOAGULATION CONSULT NOTE - Initial Consult  Pharmacy Consult for Heparin Indication:  atrial thrombus  Allergies  Allergen Reactions   Adhesive [Tape] Rash    Patient Measurements: Weight: 65 kg (143 lb 4.8 oz) Heparin Dosing Weight: 65 kg  Vital Signs: BP: 110/62 (09/28 0330) Pulse Rate: 78 (09/28 0330)  Labs: Recent Labs    02/20/21 1215 02/20/21 1432 02/21/21 0301  HGB 13.3  --   --   HCT 39.8  --   --   PLT 257  --   --   APTT  --   --  118*  LABPROT 13.8  --   --   INR 1.1  --   --   HEPARINUNFRC  --   --  0.43  CREATININE 0.73  --   --   TROPONINIHS  --  13  --      Estimated Creatinine Clearance: 90.3 mL/min (by C-G formula based on SCr of 0.73 mg/dL).   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   Anemia    microcytic anemia    Anxiety    takes Ativan daily as needed   Bipolar disorder (HCC)    takes Depakote daily   CEREBRAL PALSY 07/24/2006   Chronic bronchitis (HCC)    "gets it q yr"   Chronic bronchitis (HCC)    Chronic diabetic ulcer of foot determined by examination [facility])    Constipation    takes Miralax daily as needed   Depression    takes Risperdal nightly   Environmental allergies    takes Zyrtec daily   GASTRIC ULCER ACUTE WITH HEMORRHAGE 07/24/2006   Gastric ulcer with hemorrhage    hx of    GERD (gastroesophageal reflux disease)    takes Pravacid daily   History of blood transfusion    no abnormal reaction noted   History of hiatal hernia    History of shingles    History of stomach ulcers ~ 1996   Hypercholesterolemia    Intellectual functioning disability    Intermittent explosive disorder    Internal hemorrhoids    MRSA infection 09/06/2019   Obsessive compulsive disorder    Osteomyelitis (HCC)    right foot    Pneumonia "quite a few times"   in 2015 and in June 2016   Pressure ulcer of sacral region    hx of    Pseudomonas aeruginosa infection 09/06/2019    Psychotic disorder (HCC)    Recurrent UTI (urinary tract infection)    Seborrheic dermatitis    Severe sepsis (HCC)    Sigmoid diverticulosis    Spastic tetraplegia (HCC)    URINARY INCONTINENCE 02/09/2010   takes Detrol daily-occasionally incontinence   Vitamin D deficiency    Weakness    numbness in extremities    Medications:  Scheduled:   ipratropium  2.5 mL Inhalation Q4H   methylPREDNISolone (SOLU-MEDROL) injection  80 mg Intravenous Q24H    Assessment: Pharmacy consulted to initiate heparin for possible right atrial thrombus.  Goal of Therapy Heparin level 0.3-0.7 units/ml Monitor platelets by anticoagulation protocol: Yes   Plan:  9/28:  HL @ 0301 = 0.43, therapeutic X 1  Will draw confirmation level in 6 hrs on 9/28 @ 0900.   02/21/2021 3:55 AM

## 2021-02-21 NOTE — Progress Notes (Signed)
PHARMACY - PHYSICIAN COMMUNICATION CRITICAL VALUE ALERT - BLOOD CULTURE IDENTIFICATION (BCID)  [name] is an 60 y.o. male who presented to [facility] on 02/20/2021 with a chief complaint of Cellulitis, COVID PNA   Assessment:  Strep species in 1 of 4 bottles, no resistance detected.  (include suspected source if known)  Name of physician (or Provider) Contacted: Mansy  Current antibiotics: Cefepime, Vancomycin   Changes to prescribed antibiotics recommended:  Will d/c vanc, cefepime and start Ceftriaxone 2 gm IV Q24H.   Results for orders placed or performed during the hospital encounter of 02/20/21  Blood Culture ID Panel (Reflexed) (Collected: 02/20/2021 12:30 PM)  Result Value Ref Range   Enterococcus faecalis NOT DETECTED NOT DETECTED   Enterococcus Faecium NOT DETECTED NOT DETECTED   Listeria monocytogenes NOT DETECTED NOT DETECTED   Staphylococcus species NOT DETECTED NOT DETECTED   Staphylococcus aureus (BCID) NOT DETECTED NOT DETECTED   Staphylococcus epidermidis NOT DETECTED NOT DETECTED   Staphylococcus lugdunensis NOT DETECTED NOT DETECTED   Streptococcus species DETECTED (A) NOT DETECTED   Streptococcus agalactiae NOT DETECTED NOT DETECTED   Streptococcus pneumoniae NOT DETECTED NOT DETECTED   Streptococcus pyogenes NOT DETECTED NOT DETECTED   A.calcoaceticus-baumannii NOT DETECTED NOT DETECTED   Bacteroides fragilis NOT DETECTED NOT DETECTED   Enterobacterales NOT DETECTED NOT DETECTED   Enterobacter cloacae complex NOT DETECTED NOT DETECTED   Escherichia coli NOT DETECTED NOT DETECTED   Klebsiella aerogenes NOT DETECTED NOT DETECTED   Klebsiella oxytoca NOT DETECTED NOT DETECTED   Klebsiella pneumoniae NOT DETECTED NOT DETECTED   Proteus species NOT DETECTED NOT DETECTED   Salmonella species NOT DETECTED NOT DETECTED   Serratia marcescens NOT DETECTED NOT DETECTED   Haemophilus influenzae NOT DETECTED NOT DETECTED   Neisseria meningitidis NOT DETECTED NOT  DETECTED   Pseudomonas aeruginosa NOT DETECTED NOT DETECTED   Stenotrophomonas maltophilia NOT DETECTED NOT DETECTED   Candida albicans NOT DETECTED NOT DETECTED   Candida auris NOT DETECTED NOT DETECTED   Candida glabrata NOT DETECTED NOT DETECTED   Candida krusei NOT DETECTED NOT DETECTED   Candida parapsilosis NOT DETECTED NOT DETECTED   Candida tropicalis NOT DETECTED NOT DETECTED   Cryptococcus neoformans/gattii NOT DETECTED NOT DETECTED    Andersyn Fragoso D 02/21/2021  1:19 AM

## 2021-02-22 DIAGNOSIS — J1282 Pneumonia due to coronavirus disease 2019: Secondary | ICD-10-CM | POA: Diagnosis not present

## 2021-02-22 DIAGNOSIS — U071 COVID-19: Secondary | ICD-10-CM | POA: Diagnosis not present

## 2021-02-22 LAB — COMPREHENSIVE METABOLIC PANEL
ALT: 57 U/L — ABNORMAL HIGH (ref 0–44)
AST: 32 U/L (ref 15–41)
Albumin: 2.1 g/dL — ABNORMAL LOW (ref 3.5–5.0)
Alkaline Phosphatase: 98 U/L (ref 38–126)
Anion gap: 9 (ref 5–15)
BUN: 20 mg/dL (ref 6–20)
CO2: 23 mmol/L (ref 22–32)
Calcium: 8.6 mg/dL — ABNORMAL LOW (ref 8.9–10.3)
Chloride: 104 mmol/L (ref 98–111)
Creatinine, Ser: 0.43 mg/dL — ABNORMAL LOW (ref 0.61–1.24)
GFR, Estimated: >60 mL/min (ref >60)
Glucose, Bld: 131 mg/dL — ABNORMAL HIGH (ref 70–99)
Potassium: 4.2 mmol/L (ref 3.5–5.1)
Sodium: 136 mmol/L (ref 135–145)
Total Bilirubin: 0.6 mg/dL (ref 0.3–1.2)
Total Protein: 5.8 g/dL — ABNORMAL LOW (ref 6.5–8.1)

## 2021-02-22 LAB — GLUCOSE, CAPILLARY: Glucose-Capillary: 110 mg/dL — ABNORMAL HIGH (ref 70–99)

## 2021-02-22 LAB — HEPARIN LEVEL (UNFRACTIONATED)
Heparin Unfractionated: 0.2 IU/mL — ABNORMAL LOW (ref 0.30–0.70)
Heparin Unfractionated: 0.43 IU/mL (ref 0.30–0.70)

## 2021-02-22 LAB — PROCALCITONIN: Procalcitonin: 0.28 ng/mL

## 2021-02-22 LAB — MRSA NEXT GEN BY PCR, NASAL: MRSA by PCR Next Gen: NOT DETECTED

## 2021-02-22 MED ORDER — CEFAZOLIN SODIUM-DEXTROSE 1-4 GM/50ML-% IV SOLN
1.0000 g | Freq: Three times a day (TID) | INTRAVENOUS | Status: DC
  Administered 2021-02-23 – 2021-02-26 (×11): 1 g via INTRAVENOUS
  Filled 2021-02-22 (×12): qty 50

## 2021-02-22 MED ORDER — CEFAZOLIN SODIUM-DEXTROSE 1-4 GM/50ML-% IV SOLN
1.0000 g | Freq: Three times a day (TID) | INTRAVENOUS | Status: DC
  Filled 2021-02-22 (×2): qty 50

## 2021-02-22 MED ORDER — SODIUM CHLORIDE 0.9 % IV BOLUS
500.0000 mL | Freq: Once | INTRAVENOUS | Status: AC
  Administered 2021-02-22: 500 mL via INTRAVENOUS

## 2021-02-22 MED ORDER — HEPARIN BOLUS VIA INFUSION
1000.0000 [IU] | Freq: Once | INTRAVENOUS | Status: AC
  Administered 2021-02-22: 1000 [IU] via INTRAVENOUS
  Filled 2021-02-22: qty 1000

## 2021-02-22 MED ORDER — METHYLPREDNISOLONE SODIUM SUCC 40 MG IJ SOLR
40.0000 mg | INTRAMUSCULAR | Status: DC
  Administered 2021-02-22 – 2021-02-25 (×4): 40 mg via INTRAVENOUS
  Filled 2021-02-22 (×4): qty 1

## 2021-02-22 NOTE — Consult Note (Addendum)
[facility] Cardiology Consultation Note  Patient ID: [name], MRN: [id number], DOB/AGE: [birth year] 61 y.o. Admit date: 02/20/2021   Date of Consult: 02/22/2021 Primary Physician: Harvest Forest, MD Primary Cardiologist: none  Chief Complaint:  Chief Complaint  Patient presents with   Emesis   Fever   Reason for Consult: possible atrial thrombus noted on CT  HPI: 60 y.o. male with a past medical history of cerebral palsy with quadriplegia, right BKA, chronic sacral wound, hyperlipidemia, anxiety, bipolar disorder, gastric ulcer with bleeding, iron deficiency anemia, obsessive-compulsive disorder, DVT not on anticoagulation.  Patient has recently been admitted to the hospital for pneumonia due to COVID-19 virus with severe sepsis.  Patient had a chest CT which showed a low-attenuation 1.4 cm filling defect within the right atrium suspicious for thrombus.  Patient has been kept on IV heparin and had an 2D echo performed with no evidence of an atrial thrombus.  Patient is unable to take oral anticoagulation due to concerns of bleeding risk including gastric ulcer with bleeding, iron deficiency anemia, chronic sacral wound.  He currently is without any complaints of chest pain, shortness of breath, irregular heart rate/palpitation, lower extremity swelling.  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   Anemia    microcytic anemia    Anxiety    takes Ativan daily as needed   Bipolar disorder (HCC)    takes Depakote daily   CEREBRAL PALSY 07/24/2006   Chronic bronchitis (HCC)    "gets it q yr"   Chronic bronchitis (HCC)    Chronic diabetic ulcer of foot determined by examination [facility])    Constipation    takes Miralax daily as needed   Depression    takes Risperdal nightly   Environmental allergies    takes Zyrtec daily   GASTRIC ULCER ACUTE WITH HEMORRHAGE 07/24/2006   Gastric ulcer with hemorrhage    hx of    GERD (gastroesophageal reflux  disease)    takes Pravacid daily   History of blood transfusion    no abnormal reaction noted   History of hiatal hernia    History of shingles    History of stomach ulcers ~ 1996   Hypercholesterolemia    Intellectual functioning disability    Intermittent explosive disorder    Internal hemorrhoids    MRSA infection 09/06/2019   Obsessive compulsive disorder    Osteomyelitis (HCC)    right foot    Pneumonia "quite a few times"   in 2015 and in June 2016   Pressure ulcer of sacral region    hx of    Pseudomonas aeruginosa infection 09/06/2019   Psychotic disorder (HCC)    Recurrent UTI (urinary tract infection)    Seborrheic dermatitis    Severe sepsis (HCC)    Sigmoid diverticulosis    Spastic tetraplegia (HCC)    URINARY INCONTINENCE 02/09/2010   takes Detrol daily-occasionally incontinence   Vitamin D deficiency    Weakness    numbness in extremities      Surgical History:  Past Surgical History:  Procedure Laterality Date   AMPUTATION Right 12/14/2014   Procedure: AMPUTATION DIGIT RIGHT GREAT TOE MTP;  Surgeon: Nadara Mustard, MD;  Location: [facility] OR;  Service: Orthopedics;  Laterality: Right;   AMPUTATION Right 10/08/2019   Procedure: RIGHT FIFTH RAY AMPUTATION, ADJACENT TISSUE TRANSFER;  Surgeon: Park Liter, DPM;  Location: [facility] ORS;  Service: Podiatry;  Laterality: Right;   AMPUTATION Right 07/27/2020  Procedure: AMPUTATION BELOW KNEE;  Surgeon: Toni Arthurs, MD;  Location: [facility] OR;  Service: Orthopedics;  Laterality: Right;    BIOPSY  08/12/2018   Procedure: BIOPSY;  Surgeon: Lynann Bologna, MD;  Location: [facility];  Service: Gastroenterology;;   COLONOSCOPY WITH PROPOFOL N/A 08/14/2018   Procedure: COLONOSCOPY WITH PROPOFOL;  Surgeon: Lynann Bologna, MD;  Location: [facility];  Service: Gastroenterology;  Laterality: N/A;   ESOPHAGOGASTRODUODENOSCOPY (EGD) WITH PROPOFOL N/A 08/12/2018   Procedure: ESOPHAGOGASTRODUODENOSCOPY (EGD) WITH PROPOFOL;  Surgeon:  Lynann Bologna, MD;  Location: [facility];  Service: Gastroenterology;  Laterality: N/A;   FLEXIBLE SIGMOIDOSCOPY N/A 11/09/2020   Procedure: FLEXIBLE SIGMOIDOSCOPY;  Surgeon: [name], MD;  Location: [facility];  Service: Gastroenterology;  Laterality: N/A;   GRAFT APPLICATION Right 11/12/2019   Procedure: SKIN GRAFT SUBSTITUTE APPLICATION;  Surgeon: Park Liter, DPM;  Location: [facility];  Service: Podiatry;  Laterality: Right;   HERNIA REPAIR     HIATAL HERNIA REPAIR  1982   INCISION AND DRAINAGE ABSCESS N/A 12/08/2020   Procedure: INCISION AND DRAINAGE SACRAL DECUBITUS;  Surgeon: Sheliah Hatch De Blanch, MD;  Location: [facility];  Service: General;  Laterality: N/A;  45 MIN   LEG SURGERY Bilateral ~ 1967   "legs were scissored; stretched them out"   WOUND DEBRIDEMENT Right 11/12/2019   Procedure: DEBRIDEMENT WOUND;  Surgeon: Park Liter, DPM;  Location: [facility];  Service: Podiatry;  Laterality: Right;     Home Meds: Prior to Admission medications   Medication Sig Start Date End Date Taking? Authorizing Provider  acetaminophen (TYLENOL) 500 MG tablet Take 500 mg by mouth every 6 (six) hours as needed (for aches, pains, and/or fevers).   Yes [provider]  alum & mag hydroxide-simeth (MAALOX/MYLANTA) 200-200-20 MG/5ML suspension Take 15-30 mLs by mouth every 6 (six) hours as needed for indigestion or heartburn (or nausea).   Yes [provider]  baclofen (LIORESAL) 20 MG tablet Take 1 tablet (20 mg total) by mouth 3 (three) times daily. 07/18/16  Yes Kirsteins, Victorino Sparrow, MD  cetirizine (ZYRTEC) 10 MG tablet Take 1 tablet (10 mg total) by mouth daily. 11/01/14  Yes Street, Stephanie Coup, MD  diclofenac Sodium (VOLTAREN) 1 % GEL Apply 4 g topically See admin instructions. Apply 4 grams to painful areas four times a day 12/15/20  Yes Hollice Espy, MD  divalproex (DEPAKOTE SPRINKLE) 125 MG capsule Take 125 mg by mouth 2 (two) times daily. (0700 & 2000)   Yes  [provider]  FERREX 150 150 MG capsule Take 150 mg by mouth daily at 12 noon.   Yes [provider]  fluticasone (FLONASE) 50 MCG/ACT nasal spray Place 1 spray into both nostrils daily. (0700) 12/21/19  Yes [provider]  lactulose (CHRONULAC) 10 GM/15ML solution Take 15 mLs (10 g total) by mouth daily. 12/16/20  Yes Hollice Espy, MD  polyethylene glycol powder (GLYCOLAX/MIRALAX) 17 GM/SCOOP powder Take 17 g by mouth See admin instructions. Mix 17 grams as directed every morning and once daily as needed for constipation   Yes [provider]  tretinoin (RETIN-A) 0.025 % gel Apply 1 application topically See admin instructions. Apply to the affected areas of the face at bedtime   Yes [provider]  albuterol (VENTOLIN HFA) 108 (90 Base) MCG/ACT inhaler Inhale 2 puffs into the lungs every 4 (four) hours as needed for shortness of breath or wheezing. 01/11/20   [provider]  bisacodyl (DULCOLAX) 5 MG EC tablet  Take 1 tablet (5 mg total) by mouth daily as needed for moderate constipation. 12/15/20   Hollice Espy, MD  divalproex (DEPAKOTE SPRINKLE) 125 MG capsule Take 500 mg by mouth 2 (two) times daily. 02/19/21   [provider]  Ensure (ENSURE) Take 237 mLs by mouth 2 (two) times daily between meals.    [provider]  Foot Care Products (PREVALON HEEL PROTECTOR) MISC 1 Device by Does not apply route daily. 06/20/15   Marquette Saa, MD  guaiFENesin-dextromethorphan [facility] DM) 100-10 MG/5ML syrup Take 10 mLs by mouth every 4 (four) hours as needed for cough.     [provider]  HYDROcodone-acetaminophen (NORCO/VICODIN) 5-325 MG tablet Take 1 tablet by mouth every 4 (four) hours as needed for severe pain. 12/15/20   Hollice Espy, MD  liver oil-zinc oxide (DESITIN) 40 % ointment Apply 1 application topically 2 (two) times daily as needed (unspecified). 12/15/20   Hollice Espy, MD   ondansetron (ZOFRAN) 4 MG tablet Take 1 tablet (4 mg total) by mouth every 6 (six) hours as needed for nausea. 08/03/20   Sheikh, Kateri Mc Latif, DO  SANTYL ointment Apply 1 application topically See admin instructions. Apply to wound in the sacral area once a day    [provider]  omeprazole (PRILOSEC) 40 MG capsule Take 1 capsule (40 mg total) by mouth 2 (two) times daily. Patient not taking: Reported on 12/06/2020 08/15/18 12/15/20  Rodolph Bong, MD  Ethelda Chick (OYSTER CALCIUM) 500 MG TABS tablet Take 1,500 mg of elemental calcium by mouth daily. Patient not taking: Reported on 12/06/2020  12/15/20  [provider]    Inpatient Medications:   divalproex  125 mg Oral BID   Ensure  237 mL Oral BID BM   ipratropium  2 puff Inhalation Q4H   iron polysaccharides  150 mg Oral Q1200   lactulose  10 g Oral Daily   loratadine  10 mg Oral Daily   methylPREDNISolone (SOLU-MEDROL) injection  40 mg Intravenous Q24H   tretinoin  1 application Topical See admin instructions     ceFAZolin (ANCEF) IV     heparin 1,150 Units/hr (02/22/21 0647)   lactated ringers 100 mL/hr at 02/22/21 0251   remdesivir 100 mg in NS 100 mL 100 mg (02/22/21 1109)    Allergies:  Allergies  Allergen Reactions   Adhesive [Tape] Rash    Social History   Socioeconomic History   Marital status: Single    Spouse name: Not on file   Number of children: Not on file   Years of education: Not on file   Highest education level: Not on file  Occupational History   Not on file  Tobacco Use   Smoking status: Never   Smokeless tobacco: Never  Substance and Sexual Activity   Alcohol use: No   Drug use: No   Sexual activity: Never  Other Topics Concern   Not on file  Social History Narrative   Not on file   Social Determinants of Health   Financial Resource Strain: Not on file  Food Insecurity: Not on file  Transportation Needs: Not on file  Physical Activity: Not on file  Stress: Not on  file  Social Connections: Not on file  Intimate Partner Violence: Not on file     Family History  Adopted: Yes  Problem Relation Age of Onset   Colon cancer Neg Hx      Review of Systems Positive for none Negative for:  General:  chills, fever, night sweats or weight changes.  Cardiovascular: PND orthopnea syncope dizziness  Dermatological skin lesions rashes Respiratory: Cough congestion Urologic: Frequent urination urination at night and hematuria Abdominal: negative for nausea, vomiting, diarrhea, bright red blood per rectum, melena, or hematemesis Neurologic: negative for visual changes, and/or hearing changes  All other systems reviewed and are otherwise negative except as noted above.  Labs: No results for input(s): CKTOTAL, CKMB, TROPONINI in the last 72 hours. Lab Results  Component Value Date   WBC 12.4 (H) 02/21/2021   HGB 11.3 (L) 02/21/2021   HCT 33.9 (L) 02/21/2021   MCV 75.0 (L) 02/21/2021   PLT 234 02/21/2021    Recent Labs  Lab 02/22/21 0517  NA 136  K 4.2  CL 104  CO2 23  BUN 20  CREATININE 0.43*  CALCIUM 8.6*  PROT 5.8*  BILITOT 0.6  ALKPHOS 98  ALT 57*  AST 32  GLUCOSE 131*   Lab Results  Component Value Date   CHOL 169 06/04/2018   HDL 61 06/04/2018   LDLCALC 94 06/04/2018   TRIG 68 06/04/2018   Lab Results  Component Value Date   DDIMER 1.32 (H) 02/21/2021    Radiology/Studies:  CT Abdomen Pelvis W Contrast  Result Date: 02/20/2021 CLINICAL DATA:  Abdominal pain, acute, nonlocalized, fever EXAM: CT ABDOMEN AND PELVIS WITH CONTRAST TECHNIQUE: Multidetector CT imaging of the abdomen and pelvis was performed using the standard protocol following bolus administration of intravenous contrast. CONTRAST:  22mL OMNIPAQUE IOHEXOL 350 MG/ML SOLN COMPARISON:  12/06/2020 FINDINGS: Lower chest: Small right pleural effusion with associated dependent atelectasis. Moderate-large hiatal hernia. Heart size is normal. Coronary artery calcification is  noted. 1.4 cm ovoid low-density filling defect within the right atrium suspicious for thrombus. Hepatobiliary: There are a few small stones within the dependent portion of the gallbladder lumen. No pericholecystic inflammatory changes are evident. No suspicious liver abnormality. No biliary dilatation. Pancreas: Unremarkable. No pancreatic ductal dilatation or surrounding inflammatory changes. Spleen: Normal in size without focal abnormality. Adrenals/Urinary Tract: Unremarkable adrenal glands. Kidneys enhance symmetrically without focal lesion, stone, or hydronephrosis. Ureters are nondilated. Circumferential wall thickening of the urinary bladder. Stomach/Bowel: Moderate-large hiatal hernia. No dilated loops of bowel to suggest obstruction. Moderate volume of stool throughout the colon. Circumferential rectal wall thickening. Vascular/Lymphatic: No significant vascular findings are present. Prominent venous collaterals along the anterior pelvic wall. No enlarged abdominal or pelvic lymph nodes. Reproductive: Heterogeneously enlarged prostate gland. Other: Fluid containing right inguinal hernia. Suspect scrotal wall thickening and hydroceles, partially included at the edge of the field of view. No ascites. No pneumoperitoneum. Musculoskeletal: Chronic sacral decubitus ulcer without new bony destruction or evidence to suggest osteomyelitis. Multilevel bridging syndesmophytes within the visualized thoracic and upper lumbar spine. No acute osseous abnormality. IMPRESSION: 1. Low-attenuation 1.4 cm filling defect within the right atrium suspicious for thrombus. Recommend correlation with echocardiogram. 2. Circumferential wall thickening of the urinary bladder, which may represent cystitis. Correlation with urinalysis is recommended. 3. Chronic sacral decubitus ulcer without new bony destruction or evidence to suggest osteomyelitis. 4. Small right pleural effusion with associated dependent atelectasis. 5.  Moderate-large hiatal hernia. 6. Cholelithiasis without evidence of acute cholecystitis. 7. Fluid containing right inguinal hernia. 8. Suspect scrotal wall thickening and hydroceles, partially included at the edge of the field of view. Correlate with physical exam. Electronically Signed   By: Duanne Guess D.O.   On: 02/20/2021 14:45   US Venous Img Lower Unilateral Left (DVT)  Result Date:  02/20/2021 CLINICAL DATA:  Left leg cellulitis EXAM: LEFT LOWER EXTREMITY VENOUS DOPPLER ULTRASOUND TECHNIQUE: Gray-scale sonography with compression, as well as color and duplex ultrasound, were performed to evaluate the deep venous system(s) from the level of the common femoral vein through the popliteal and proximal calf veins. COMPARISON:  None. FINDINGS: VENOUS Normal compressibility of the common femoral, superficial femoral, and popliteal veins, as well as the visualized calf veins. Visualized portions of profunda femoral vein and great saphenous vein unremarkable. No filling defects to suggest DVT on grayscale or color Doppler imaging. Doppler waveforms show normal direction of venous flow, normal respiratory plasticity and response to augmentation. Limited views of the contralateral common femoral vein are unremarkable. OTHER None. Limitations: Technologist reports technically limited due to patient condition IMPRESSION: No evidence of left lower extremity DVT. Electronically Signed   By: Guadlupe Spanish M.D.   On: 02/20/2021 19:26   DG Chest Port 1 View  Result Date: 02/20/2021 CLINICAL DATA:  Sepsis EXAM: PORTABLE CHEST 1 VIEW COMPARISON:  Chest radiograph 11/26/2020 FINDINGS: The patient is rotated to the right. The cardiomediastinal silhouette is suboptimally evaluated due to patient rotation. Opacity in the medial left base likely reflects a hiatal hernia as seen on the prior chest CT. There are patchy opacities in the right lung. There is no definite right pleural effusion. The left lung is clear. There  is no left pleural effusion. There is no definite pneumothorax. No acute osseous abnormality is identified. IMPRESSION: Suboptimal study due to rightward patient rotation. Patchy opacities in the right lung could reflect infection in the correct clinical setting. Electronically Signed   By: Lesia Hausen M.D.   On: 02/20/2021 13:04   ECHOCARDIOGRAM COMPLETE  Result Date: 02/21/2021    ECHOCARDIOGRAM REPORT   Patient Name:   ANTRON SETH Lorenzen Date of Exam: 02/21/2021 Medical Rec #:  [id number]      Height:       72.0 in Accession #:    [id number]     Weight:       143.3 lb Date of Birth:  06/26/1960     BSA:          1.849 m Patient Age:    59 years       BP:           115/69 mmHg Patient Gender: M              HR:           96 bpm. Exam Location:  [facility] Procedure: 2D Echo, Color Doppler and Cardiac Doppler Indications:     Other abnormalities of the heart R00.8  History:         Patient has no prior history of Echocardiogram examinations.                  Chronic bronchitis, pneumonia.  Sonographer:     Cristela Blue Referring Phys:  [id number] Lorretta Harp Diagnosing Phys: Debbe Odea MD  Sonographer Comments: Technically difficult study due to poor echo windows. Pt was in fetal position on right side----could not be moved into better position for echo. IMPRESSIONS  1. Left ventricular ejection fraction, by estimation, is 55%. The left ventricle has normal function. Left ventricular endocardial border not optimally defined to evaluate regional wall motion. Left ventricular diastolic parameters are consistent with Grade I diastolic dysfunction (impaired relaxation).  2. Right ventricular systolic function is normal. The right ventricular size is not well visualized.  3. The mitral valve is grossly  normal. No evidence of mitral valve regurgitation.  4. The aortic valve was not well visualized. Aortic valve regurgitation is not visualized. FINDINGS  Left Ventricle: Left ventricular ejection fraction, by estimation, is 55%.  The left ventricle has normal function. Left ventricular endocardial border not optimally defined to evaluate regional wall motion. The left ventricular internal cavity size was normal in size. There is no left ventricular hypertrophy. Left ventricular diastolic parameters are consistent with Grade I diastolic dysfunction (impaired relaxation). Right Ventricle: The right ventricular size is not well visualized. Right vetricular wall thickness was not well visualized. Right ventricular systolic function is normal. Left Atrium: Left atrial size was normal in size. Right Atrium: Right atrial size was not well visualized. Pericardium: There is no evidence of pericardial effusion. Mitral Valve: The mitral valve is grossly normal. No evidence of mitral valve regurgitation. Tricuspid Valve: The tricuspid valve is not well visualized. Tricuspid valve regurgitation is not demonstrated. Aortic Valve: The aortic valve was not well visualized. Aortic valve regurgitation is not visualized. Aortic valve mean gradient measures 2.0 mmHg. Aortic valve peak gradient measures 3.8 mmHg. Aortic valve area, by VTI measures 2.93 cm. Pulmonic Valve: The pulmonic valve was not well visualized. Pulmonic valve regurgitation is not visualized. Aorta: The aortic root was not well visualized. Venous: The inferior vena cava was not well visualized. IAS/Shunts: The interatrial septum was not well visualized.  LEFT VENTRICLE PLAX 2D LVIDd:         2.60 cm  Diastology LVIDs:         1.75 cm  LV e' medial:    5.87 cm/s LV PW:         0.90 cm  LV E/e' medial:  11.0 LV IVS:        0.80 cm  LV e' lateral:   7.83 cm/s LVOT diam:     2.00 cm  LV E/e' lateral: 8.3 LV SV:         61 LV SV Index:   33 LVOT Area:     3.14 cm  RIGHT VENTRICLE RV Basal diam:  2.70 cm RV S prime:     15.10 cm/s TAPSE (M-mode): 3.7 cm LEFT ATRIUM             Index       RIGHT ATRIUM           Index LA diam:        2.70 cm 1.46 cm/m  RA Area:     19.20 cm LA Vol (A2C):   59.2  ml 32.02 ml/m RA Volume:   49.10 ml  26.55 ml/m LA Vol (A4C):   40.9 ml 22.12 ml/m LA Biplane Vol: 49.2 ml 26.61 ml/m  AORTIC VALVE AV Area (Vmax):    2.88 cm AV Area (Vmean):   2.63 cm AV Area (VTI):     2.93 cm AV Vmax:           97.60 cm/s AV Vmean:          73.000 cm/s AV VTI:            0.207 m AV Peak Grad:      3.8 mmHg AV Mean Grad:      2.0 mmHg LVOT Vmax:         89.40 cm/s LVOT Vmean:        61.200 cm/s LVOT VTI:          0.193 m LVOT/AV VTI ratio: 0.93 MITRAL VALVE MV Area (PHT): 4.06 cm  SHUNTS MV Decel Time: 187 msec    Systemic VTI:  0.19 m MV E velocity: 64.70 cm/s  Systemic Diam: 2.00 cm MV A velocity: 87.40 cm/s MV E/A ratio:  0.74 Debbe Odea MD Electronically signed by Debbe Odea MD Signature Date/Time: 02/21/2021/5:57:55 PM    Final     EKG: Normal sinus rhythm at 97 bpm  Weights: Filed Weights   02/20/21 1622 02/22/21 0324  Weight: 65 kg 60 kg     Physical Exam: Blood pressure (!) 147/95, pulse 90, temperature 97.6 F (36.4 C), temperature source Oral, resp. rate 18, weight 60 kg, SpO2 97 %. Body mass index is 17.94 kg/m.      Assessment: 60 year old male who presented from [facility] with a medical history significant for chronic sacral wound, hyperlipidemia, gastric ulcer with bleeding, iron deficiency anemia, bipolar disorder, DVT not on anticoagulation.  Patient positive for severe sepsis from COVID-19 infection.  Initial CT scan of the chest showed a small filling defect suspicious for possible thrombus in the right atrium which was followed up with 2D echocardiogram which showed no evidence of thrombus.  Patient is currently unable to take oral anticoagulation due to high bleeding risk.  He has no history of irregular heart rhythms including atrial fibrillation/flutter or other reasons for formation of a right atrial thrombus.   Plan: -No further cardiac diagnostic testing necessary.  CT finding was likely secondary to a filling defect and  unlikely to be related to a right atrial thrombus and or tumor.  Echocardiogram showed no evidence of a right atrial thrombus or tumor.  -Can discontinue IV heparin -Continue supportive care measures for severe sepsis related to COVID-19 -Patient is currently stable from a cardiovascular standpoint without further need in workup and or treatment of above.  Please contact us with any questions.   Signed, Maralyn Sago PA-C [facility] Cardiology 02/22/2021, 2:43 PM  The patient has been interviewed and examined. I agree with assessment and plan above. Arnoldo Hooker MD [facility]

## 2021-02-22 NOTE — Progress Notes (Signed)
Patient unable to void. Bladder scan resulted at . On call provider notified. No new orders at this time. Will continue to assess for output.

## 2021-02-22 NOTE — Progress Notes (Signed)
Patient unable to void. Bladder scanned a second time. Results are . On call  provider notified. New order to in and out cath. Cath yielded yellow urine. Patient tolerated procedure without difficulty.

## 2021-02-22 NOTE — Progress Notes (Addendum)
PROGRESS NOTE    KARTIK FERNANDO   UKG:[id number]  DOB: 1961-02-19  DOA: 02/20/2021 PCP: [place], MD   Brief Narrative:  [name] is a 60 y.o. 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 In the ED was found to have a temperature of 103.3.  He had erythema on his left lower leg consistent with cellulitis.  Blood pressure was 80s over 50s and improved after giving multiple fluid boluses.  Rested positive for COVID-19.   Subjective: He has no new complaints today.  He says diarrhea is improving.  He has no nausea or vomiting.    Assessment & Plan:   Principal Problem:   Pneumonia due to COVID-19 virus-severe sepsis -Continue remdesivir and IV steroids  Active Problems: Nausea vomiting and diarrhea - Likely related to acute COVID infection and now appears to be improving  ?  Atrial thrombus noted on CT - 2D echo does not reveal a thrombus-  - no DVT on vascular duplex - will asked cardiology for input  Cellulitis of left leg - Leg remains erythematous and swollen - We will change ceftriaxone to Ancef and continue to follow  Dehydration - Started on IV fluids yesterday (Ringer's lactate at 100 cc an hour) - Per RN, the patient is making very little urine-I will give him a 500 cc fluid bolus today - Continue to follow I's and O's-I have asked RN to push fluids  Iron deficiency anemia - Continue iron capsule daily    Cerebral palsy, quadriplegic (HCC) -Needs total care  Pressure injuries - Stage III pressure injury on sacrum - Stage II pressure injury on left medial knee  Obsessive compulsive disorder   Time spent  in minutes: 35 DVT prophylaxis:   Lovenox Code Status: Full code Family Communication:  Level of Care: Level of care: Progressive Cardiac Disposition Plan:  Status is: Inpatient  Remains inpatient appropriate because:IV treatments appropriate due to intensity of illness or inability to take PO  Dispo: The patient is from: [facility]              Anticipated d/c is to:  house              Patient currently is not medically stable to d/c.   Difficult to place patient No      Consultants:  None Procedures:  None Antimicrobials:  Anti-infectives (From admission, onward)    Start     Dose/Rate Route Frequency Ordered Stop   02/21/21 1000  remdesivir 100 mg in sodium chloride 0.9 % 100 mL IVPB       See Hyperspace for full Linked Orders Report.   100 mg 200 mL/hr over 30 Minutes Intravenous Daily 02/20/21 1526 02/25/21 0959   02/21/21 0600  cefTRIAXone (ROCEPHIN) 2 g in sodium chloride 0.9 % 100 mL IVPB        2 g 200 mL/hr over 30 Minutes Intravenous Every 24 hours 02/21/21 0119     02/21/21 0400  vancomycin (VANCOREADY) IVPB 750 mg/150 mL  Status:  Discontinued        750 mg 150 mL/hr over 60 Minutes Intravenous Every 12 hours 02/20/21 1721 02/21/21 0118   02/20/21 1730  ceFEPIme (MAXIPIME)  2 g in sodium chloride 0.9 % 100 mL IVPB  Status:  Discontinued        2 g 200 mL/hr over 30 Minutes Intravenous Every 8 hours 02/20/21 1721 02/21/21 0118   02/20/21 1530  remdesivir 200 mg in sodium chloride 0.9% 250 mL IVPB       See Hyperspace for full Linked Orders Report.   200 mg 580 mL/hr over 30 Minutes Intravenous Once 02/20/21 1526 02/20/21 1737   02/20/21 1330  ceFEPIme (MAXIPIME) 2 g in sodium chloride 0.9 % 100 mL IVPB        2 g 200 mL/hr over 30 Minutes Intravenous  Once 02/20/21 1323 02/20/21 1426   02/20/21 1330  metroNIDAZOLE (FLAGYL) IVPB 500 mg        500 mg 100 mL/hr over 60 Minutes Intravenous  Once 02/20/21 1323 02/20/21 1540   02/20/21 1330   vancomycin (VANCOCIN) IVPB 1000 mg/200 mL premix        1,000 mg 200 mL/hr over 60 Minutes Intravenous  Once 02/20/21 1323 02/20/21 1700        Objective: Vitals:   02/22/21 0008 02/22/21 0324 02/22/21 0930 02/22/21 1109  BP: 121/82 120/81 132/76 (!) 147/95  Pulse: 84 71 77 90  Resp: 18 18    Temp: (!) 97.5 F (36.4 C) 97.9 F (36.6 C) 97.6 F (36.4 C) 97.6 F (36.4 C)  TempSrc: Oral Oral Oral Oral  SpO2: 94% 94% 100% 97%  Weight:  60 kg      Intake/Output Summary (Last 24 hours) at 02/22/2021 1316 Last data filed at 02/22/2021 0930 Gross per 24 hour  Intake 1767.01 ml  Output 450 ml  Net 1317.01 ml   Filed Weights   02/20/21 1622 02/22/21 0324  Weight: 65 kg 60 kg    Examination: General exam: Appears comfortable  HEENT: PERRLA, oral mucosa moist, no sclera icterus or thrush Respiratory system: Clear to auscultation. Respiratory effort normal. Cardiovascular system: S1 & S2 heard, RRR.   Gastrointestinal system: Abdomen soft, non-tender, nondistended. Normal bowel sounds. Central nervous system: Alert and oriented.  Quadriparesis Extremities: No cyanosis, clubbing or edema Skin: No rashes or ulcers Psychiatry:  Mood & affect appropriate.     Data Reviewed: I have personally reviewed following labs and imaging studies  CBC: Recent Labs  Lab 02/20/21 1215 02/21/21 0654  WBC 17.6* 12.4*  NEUTROABS 16.1* 10.9*  HGB 13.3 11.3*  HCT 39.8 33.9*  MCV 74.7* 75.0*  PLT 257 234   Basic Metabolic Panel: Recent Labs  Lab 02/20/21 1215 02/20/21 1432 02/21/21 0654  NA 138  --  138  K 3.7  --  4.1  CL 101  --  104  CO2 25  --  24  GLUCOSE 132*  --  128*  BUN 26*  --  22*  CREATININE 0.73  --  0.50*  CALCIUM 9.5  --  9.0  MG  --  1.8  --    GFR: Estimated Creatinine Clearance: 83.3 mL/min (A) (by C-G formula based on SCr of 0.5 mg/dL (L)). Liver Function Tests: Recent Labs  Lab 02/20/21 1215 02/21/21 0654  AST 79* 37  ALT 131* 77*  ALKPHOS 169*  118  BILITOT 0.7 0.6  PROT 7.9 6.4*  ALBUMIN 3.1* 2.4*   Recent Labs  Lab 02/20/21 1215  LIPASE 22   No results for input(s): AMMONIA in the last 168 hours. Coagulation Profile: Recent Labs  Lab 02/20/21 1215  INR 1.1   Cardiac Enzymes: No  results for input(s): CKTOTAL, CKMB, CKMBINDEX, TROPONINI in the last 168 hours. BNP (last 3 results) No results for input(s): PROBNP in the last 8760 hours. HbA1C: No results for input(s): HGBA1C in the last 72 hours. CBG: Recent Labs  Lab 02/22/21 0937  GLUCAP 110*   Lipid Profile: No results for input(s): CHOL, HDL, LDLCALC, TRIG, CHOLHDL, LDLDIRECT in the last 72 hours. Thyroid Function Tests: No results for input(s): TSH, T4TOTAL, FREET4, T3FREE, THYROIDAB in the last 72 hours. Anemia Panel: Recent Labs    02/21/21 0654  FERRITIN 190   Urine analysis:    Component Value Date/Time   COLORURINE YELLOW (A) 02/20/2021 1432   APPEARANCEUR CLEAR (A) 02/20/2021 1432   LABSPEC >1.046 (H) 02/20/2021 1432   PHURINE 6.0 02/20/2021 1432   GLUCOSEU NEGATIVE 02/20/2021 1432   HGBUR NEGATIVE 02/20/2021 1432   HGBUR negative 06/14/2010 1119   BILIRUBINUR NEGATIVE 02/20/2021 1432   BILIRUBINUR SMALL 01/04/2016 1634   KETONESUR NEGATIVE 02/20/2021 1432   PROTEINUR 100 (A) 02/20/2021 1432   UROBILINOGEN 2.0 (H) 02/05/2018 1825   NITRITE NEGATIVE 02/20/2021 1432   LEUKOCYTESUR NEGATIVE 02/20/2021 1432   Sepsis Labs: (procalcitonin:4,lacticidven:4) ) Recent Results (from the past 240 hour(s))  Culture, blood (Routine x 2)     Status: Abnormal (Preliminary result)   Collection Time: 02/20/21 12:30 PM   Specimen: BLOOD  Result Value Ref Range Status   Specimen Description   Final    BLOOD LEFT ANTECUBITAL Performed at [facility], [address], [place], [place] [address]    Special Requests   Final    BOTTLES DRAWN AEROBIC AND ANAEROBIC Blood Culture adequate volume Performed at [facility], [address] [place], [place] [address]    Culture  Setup Time   Final    GRAM POSITIVE COCCI IN BOTH AEROBIC AND ANAEROBIC BOTTLES CRITICAL RESULT CALLED TO, READ BACK BY AND VERIFIED WITH: JASON ROBBINS PHARMD 0106 02/21/21 HNM    Culture (A)  Final    STAPHYLOCOCCUS CAPITIS VIRIDANS STREPTOCOCCUS THE SIGNIFICANCE OF ISOLATING THIS ORGANISM FROM A SINGLE SET OF BLOOD CULTURES WHEN MULTIPLE SETS ARE DRAWN IS UNCERTAIN. PLEASE NOTIFY THE MICROBIOLOGY DEPARTMENT WITHIN ONE WEEK IF SPECIATION AND SENSITIVITIES ARE REQUIRED. Performed at [facility], [address] [place], [place] [address]    Report Status PENDING  Incomplete  Blood Culture ID Panel (Reflexed)     Status: Abnormal   Collection Time: 02/20/21 12:30 PM  Result Value Ref Range Status   Enterococcus faecalis NOT DETECTED NOT DETECTED Final   Enterococcus Faecium NOT DETECTED NOT DETECTED Final   Listeria monocytogenes NOT DETECTED NOT DETECTED Final   Staphylococcus species NOT DETECTED NOT DETECTED Final   Staphylococcus aureus (BCID) NOT DETECTED NOT DETECTED Final   Staphylococcus epidermidis NOT DETECTED NOT DETECTED Final   Staphylococcus lugdunensis NOT DETECTED NOT DETECTED Final   Streptococcus species DETECTED (A) NOT DETECTED Final    Comment: Not Enterococcus species, Streptococcus agalactiae, Streptococcus pyogenes, or Streptococcus pneumoniae. CRITICAL RESULT CALLED TO, READ BACK BY AND VERIFIED WITH: JASON ROBBINS PHARMD 0106 02/21/21 HNM    Streptococcus agalactiae NOT DETECTED NOT DETECTED Final   Streptococcus pneumoniae NOT DETECTED NOT DETECTED Final   Streptococcus pyogenes NOT DETECTED NOT DETECTED Final   A.calcoaceticus-baumannii NOT DETECTED NOT DETECTED Final   Bacteroides fragilis NOT DETECTED NOT DETECTED Final   Enterobacterales NOT DETECTED NOT DETECTED Final   Enterobacter cloacae complex NOT DETECTED NOT DETECTED Final   Escherichia coli NOT DETECTED  NOT DETECTED Final   Klebsiella  aerogenes NOT DETECTED NOT DETECTED Final   Klebsiella oxytoca NOT DETECTED NOT DETECTED Final   Klebsiella pneumoniae NOT DETECTED NOT DETECTED Final   Proteus species NOT DETECTED NOT DETECTED Final   Salmonella species NOT DETECTED NOT DETECTED Final   Serratia marcescens NOT DETECTED NOT DETECTED Final   Haemophilus influenzae NOT DETECTED NOT DETECTED Final   Neisseria meningitidis NOT DETECTED NOT DETECTED Final   Pseudomonas aeruginosa NOT DETECTED NOT DETECTED Final   Stenotrophomonas maltophilia NOT DETECTED NOT DETECTED Final   Candida albicans NOT DETECTED NOT DETECTED Final   Candida auris NOT DETECTED NOT DETECTED Final   Candida glabrata NOT DETECTED NOT DETECTED Final   Candida krusei NOT DETECTED NOT DETECTED Final   Candida parapsilosis NOT DETECTED NOT DETECTED Final   Candida tropicalis NOT DETECTED NOT DETECTED Final   Cryptococcus neoformans/gattii NOT DETECTED NOT DETECTED Final    Comment: Performed at [facility], [address] [place], [place] [address]  Culture, blood (Routine x 2)     Status: None (Preliminary result)   Collection Time: 02/20/21 12:33 PM   Specimen: BLOOD  Result Value Ref Range Status   Specimen Description BLOOD LEFT ANTECUBITAL  Final   Special Requests   Final    BOTTLES DRAWN AEROBIC AND ANAEROBIC Blood Culture adequate volume   Culture   Final    NO GROWTH 2 DAYS Performed at [facility], [address] [place], [place] [address]    Report Status PENDING  Incomplete  Resp Panel by RT-PCR (Flu A&B, Covid) Nasopharyngeal Swab     Status: Abnormal   Collection Time: 02/20/21  1:05 PM   Specimen: Nasopharyngeal Swab; Nasopharyngeal(NP) swabs in vial transport medium  Result Value Ref Range Status   SARS Coronavirus 2 by RT PCR POSITIVE (A) NEGATIVE Final    Comment: RESULT CALLED TO, READ BACK BY AND VERIFIED WITH: LEXI OLIVER 02/20/21 1424 KLW (NOTE) SARS-CoV-2 target nucleic acids are DETECTED.  The  SARS-CoV-2 RNA is generally detectable in upper respiratory specimens during the acute phase of infection. Positive results are indicative of the presence of the identified virus, but do not rule out bacterial infection or co-infection with other pathogens not detected by the test. Clinical correlation with patient history and other diagnostic information is necessary to determine patient infection status. The expected result is Negative.  Fact Sheet for Patients: BloggerCourse.com  Fact Sheet for Healthcare Providers: SeriousBroker.it  This test is not yet approved or cleared by the [place] FDA and  has been authorized for detection and/or diagnosis of SARS-CoV-2 by FDA under an Emergency Use Authorization (EUA).  This EUA will remain in effect (meaning this test can be used ) for the duration of  the COVID-19 declaration under Section 564(b)(1) of the Act, 21 U.S.C. section 360bbb-3(b)(1), unless the authorization is terminated or revoked sooner.     Influenza A by PCR NEGATIVE NEGATIVE Final   Influenza B by PCR NEGATIVE NEGATIVE Final    Comment: (NOTE) The Xpert Xpress SARS-CoV-2/FLU/RSV plus assay is intended as an aid in the diagnosis of influenza from Nasopharyngeal swab specimens and should not be used as a sole basis for treatment. Nasal washings and aspirates are unacceptable for Xpert Xpress SARS-CoV-2/FLU/RSV testing.  Fact Sheet for Patients: BloggerCourse.com  Fact Sheet for Healthcare Providers: SeriousBroker.it  This test is not yet approved or cleared by the [place] FDA and has been authorized for detection and/or diagnosis of SARS-CoV-2  by FDA under an Emergency Use Authorization (EUA). This EUA will remain in effect (meaning this test can be used) for the duration of the COVID-19 declaration under Section 564(b)(1) of the Act, 21 U.S.C. section  360bbb-3(b)(1), unless the authorization is terminated or revoked.  Performed at [facility], [address] [place], [place] [address]   Urine Culture     Status: None   Collection Time: 02/20/21  2:32 PM   Specimen: Urine, Random  Result Value Ref Range Status   Specimen Description   Final    URINE, RANDOM Performed at [facility], [address] [place], [place] [address]    Special Requests   Final    NONE Performed at [facility], [address] [place], [place] [address]    Culture   Final    NO GROWTH Performed at [facility], [address] [place], [place] [address]    Report Status 02/21/2021 FINAL  Final  MRSA Next Gen by PCR, Nasal     Status: None   Collection Time: 02/22/21  1:44 AM   Specimen: Nasal Mucosa; Nasal Swab  Result Value Ref Range Status   MRSA by PCR Next Gen NOT DETECTED NOT DETECTED Final    Comment: (NOTE) The GeneXpert MRSA Assay (FDA approved for NASAL specimens only), is one component of a comprehensive MRSA colonization surveillance program. It is not intended to diagnose MRSA infection nor to guide or monitor treatment for MRSA infections. Test performance is not FDA approved in patients less than 75 years old. Performed at [facility], [address] [place], [place] [address]          Radiology Studies: CT Abdomen Pelvis W Contrast  Result Date: 02/20/2021 CLINICAL DATA:  Abdominal pain, acute, nonlocalized, fever EXAM: CT ABDOMEN AND PELVIS WITH CONTRAST TECHNIQUE: Multidetector CT imaging of the abdomen and pelvis was performed using the standard protocol following bolus administration of intravenous contrast. CONTRAST:  75mL OMNIPAQUE IOHEXOL 350 MG/ML SOLN COMPARISON:  12/06/2020 FINDINGS: Lower chest: Small right pleural effusion with associated dependent atelectasis. Moderate-large hiatal hernia. Heart size is normal. Coronary artery calcification is noted. 1.4 cm ovoid  low-density filling defect within the right atrium suspicious for thrombus. Hepatobiliary: There are a few small stones within the dependent portion of the gallbladder lumen. No pericholecystic inflammatory changes are evident. No suspicious liver abnormality. No biliary dilatation. Pancreas: Unremarkable. No pancreatic ductal dilatation or surrounding inflammatory changes. Spleen: Normal in size without focal abnormality. Adrenals/Urinary Tract: Unremarkable adrenal glands. Kidneys enhance symmetrically without focal lesion, stone, or hydronephrosis. Ureters are nondilated. Circumferential wall thickening of the urinary bladder. Stomach/Bowel: Moderate-large hiatal hernia. No dilated loops of bowel to suggest obstruction. Moderate volume of stool throughout the colon. Circumferential rectal wall thickening. Vascular/Lymphatic: No significant vascular findings are present. Prominent venous collaterals along the anterior pelvic wall. No enlarged abdominal or pelvic lymph nodes. Reproductive: Heterogeneously enlarged prostate gland. Other: Fluid containing right inguinal hernia. Suspect scrotal wall thickening and hydroceles, partially included at the edge of the field of view. No ascites. No pneumoperitoneum. Musculoskeletal: Chronic sacral decubitus ulcer without new bony destruction or evidence to suggest osteomyelitis. Multilevel bridging syndesmophytes within the visualized thoracic and upper lumbar spine. No acute osseous abnormality. IMPRESSION: 1. Low-attenuation 1.4 cm filling defect within the right atrium suspicious for thrombus. Recommend correlation with echocardiogram. 2. Circumferential wall thickening of the urinary bladder, which may represent cystitis. Correlation with urinalysis is recommended. 3. Chronic sacral decubitus ulcer  without new bony destruction or evidence to suggest osteomyelitis. 4. Small right pleural effusion with associated dependent atelectasis. 5. Moderate-large hiatal hernia. 6.  Cholelithiasis without evidence of acute cholecystitis. 7. Fluid containing right inguinal hernia. 8. Suspect scrotal wall thickening and hydroceles, partially included at the edge of the field of view. Correlate with physical exam. Electronically Signed   By: Duanne Guess D.O.   On: 02/20/2021 14:45   US Venous Img Lower Unilateral Left (DVT)  Result Date: 02/20/2021 CLINICAL DATA:  Left leg cellulitis EXAM: LEFT LOWER EXTREMITY VENOUS DOPPLER ULTRASOUND TECHNIQUE: Gray-scale sonography with compression, as well as color and duplex ultrasound, were performed to evaluate the deep venous system(s) from the level of the common femoral vein through the popliteal and proximal calf veins. COMPARISON:  None. FINDINGS: VENOUS Normal compressibility of the common femoral, superficial femoral, and popliteal veins, as well as the visualized calf veins. Visualized portions of profunda femoral vein and great saphenous vein unremarkable. No filling defects to suggest DVT on grayscale or color Doppler imaging. Doppler waveforms show normal direction of venous flow, normal respiratory plasticity and response to augmentation. Limited views of the contralateral common femoral vein are unremarkable. OTHER None. Limitations: Technologist reports technically limited due to patient condition IMPRESSION: No evidence of left lower extremity DVT. Electronically Signed   By: Guadlupe Spanish M.D.   On: 02/20/2021 19:26   ECHOCARDIOGRAM COMPLETE  Result Date: 02/21/2021    ECHOCARDIOGRAM REPORT   Patient Name:   JULION GATT Nieblas Date of Exam: 02/21/2021 Medical Rec #:  [id number]      Height:       72.0 in Accession #:    [id number]     Weight:       143.3 lb Date of Birth:  16-Feb-1961     BSA:          1.849 m Patient Age:    59 years       BP:           115/69 mmHg Patient Gender: M              HR:           96 bpm. Exam Location:  [facility] Procedure: 2D Echo, Color Doppler and Cardiac Doppler Indications:     Other abnormalities of  the heart R00.8  History:         Patient has no prior history of Echocardiogram examinations.                  Chronic bronchitis, pneumonia.  Sonographer:     Cristela Blue Referring Phys:  [id number] Lorretta Harp Diagnosing Phys: Debbe Odea MD  Sonographer Comments: Technically difficult study due to poor echo windows. Pt was in fetal position on right side----could not be moved into better position for echo. IMPRESSIONS  1. Left ventricular ejection fraction, by estimation, is 55%. The left ventricle has normal function. Left ventricular endocardial border not optimally defined to evaluate regional wall motion. Left ventricular diastolic parameters are consistent with Grade I diastolic dysfunction (impaired relaxation).  2. Right ventricular systolic function is normal. The right ventricular size is not well visualized.  3. The mitral valve is grossly normal. No evidence of mitral valve regurgitation.  4. The aortic valve was not well visualized. Aortic valve regurgitation is not visualized. FINDINGS  Left Ventricle: Left ventricular ejection fraction, by estimation, is 55%. The left ventricle has normal function. Left ventricular endocardial border not optimally defined to evaluate regional wall  motion. The left ventricular internal cavity size was normal in size. There is no left ventricular hypertrophy. Left ventricular diastolic parameters are consistent with Grade I diastolic dysfunction (impaired relaxation). Right Ventricle: The right ventricular size is not well visualized. Right vetricular wall thickness was not well visualized. Right ventricular systolic function is normal. Left Atrium: Left atrial size was normal in size. Right Atrium: Right atrial size was not well visualized. Pericardium: There is no evidence of pericardial effusion. Mitral Valve: The mitral valve is grossly normal. No evidence of mitral valve regurgitation. Tricuspid Valve: The tricuspid valve is not well visualized. Tricuspid valve  regurgitation is not demonstrated. Aortic Valve: The aortic valve was not well visualized. Aortic valve regurgitation is not visualized. Aortic valve mean gradient measures 2.0 mmHg. Aortic valve peak gradient measures 3.8 mmHg. Aortic valve area, by VTI measures 2.93 cm. Pulmonic Valve: The pulmonic valve was not well visualized. Pulmonic valve regurgitation is not visualized. Aorta: The aortic root was not well visualized. Venous: The inferior vena cava was not well visualized. IAS/Shunts: The interatrial septum was not well visualized.  LEFT VENTRICLE PLAX 2D LVIDd:         2.60 cm  Diastology LVIDs:         1.75 cm  LV e' medial:    5.87 cm/s LV PW:         0.90 cm  LV E/e' medial:  11.0 LV IVS:        0.80 cm  LV e' lateral:   7.83 cm/s LVOT diam:     2.00 cm  LV E/e' lateral: 8.3 LV SV:         61 LV SV Index:   33 LVOT Area:     3.14 cm  RIGHT VENTRICLE RV Basal diam:  2.70 cm RV S prime:     15.10 cm/s TAPSE (M-mode): 3.7 cm LEFT ATRIUM             Index       RIGHT ATRIUM           Index LA diam:        2.70 cm 1.46 cm/m  RA Area:     19.20 cm LA Vol (A2C):   59.2 ml 32.02 ml/m RA Volume:   49.10 ml  26.55 ml/m LA Vol (A4C):   40.9 ml 22.12 ml/m LA Biplane Vol: 49.2 ml 26.61 ml/m  AORTIC VALVE AV Area (Vmax):    2.88 cm AV Area (Vmean):   2.63 cm AV Area (VTI):     2.93 cm AV Vmax:           97.60 cm/s AV Vmean:          73.000 cm/s AV VTI:            0.207 m AV Peak Grad:      3.8 mmHg AV Mean Grad:      2.0 mmHg LVOT Vmax:         89.40 cm/s LVOT Vmean:        61.200 cm/s LVOT VTI:          0.193 m LVOT/AV VTI ratio: 0.93 MITRAL VALVE MV Area (PHT): 4.06 cm    SHUNTS MV Decel Time: 187 msec    Systemic VTI:  0.19 m MV E velocity: 64.70 cm/s  Systemic Diam: 2.00 cm MV A velocity: 87.40 cm/s MV E/A ratio:  0.74 Debbe Odea MD Electronically signed by Debbe Odea MD Signature Date/Time: 02/21/2021/5:57:55 PM  Final       Scheduled Meds:  divalproex  125 mg Oral BID   Ensure   237 mL Oral BID BM   ipratropium  2 puff Inhalation Q4H   iron polysaccharides  150 mg Oral Q1200   lactulose  10 g Oral Daily   loratadine  10 mg Oral Daily   methylPREDNISolone (SOLU-MEDROL) injection  80 mg Intravenous Q24H   tretinoin  1 application Topical See admin instructions   Continuous Infusions:  cefTRIAXone (ROCEPHIN)  IV 2 g (02/22/21 0539)   heparin 1,150 Units/hr (02/22/21 0647)   lactated ringers 100 mL/hr at 02/22/21 0251   remdesivir 100 mg in NS 100 mL 100 mg (02/22/21 1109)   sodium chloride       LOS: 2 days      Calvert Cantor, MD [facility] Pager: [email or web address] 02/22/2021, 1:16 PM

## 2021-02-22 NOTE — Progress Notes (Signed)
ANTICOAGULATION CONSULT NOTE - Follow Up Consult  Pharmacy Consult for Heparin infusion Indication:  atrial thrombus  Allergies  Allergen Reactions   Adhesive [Tape] Rash    Patient Measurements: Weight: 60 kg (132 lb 4.4 oz) Heparin Dosing Weight: 65 kg  Vital Signs: Temp: 97.9 F (36.6 C) (09/29 0324) Temp Source: Oral (09/29 0324) BP: 120/81 (09/29 0324) Pulse Rate: 71 (09/29 0324)  Labs: Recent Labs    02/20/21 1215 02/20/21 1432 02/21/21 0301 02/21/21 0654 02/21/21 1035 02/22/21 0517  HGB 13.3  --   --  11.3*  --   --   HCT 39.8  --   --  33.9*  --   --   PLT 257  --   --  234  --   --   APTT  --   --  118*  --   --   --   LABPROT 13.8  --   --   --   --   --   INR 1.1  --   --   --   --   --   HEPARINUNFRC  --   --  0.43  --  0.30 0.20*  CREATININE 0.73  --   --  0.50*  --   --   TROPONINIHS  --  13  --   --   --   --      Estimated Creatinine Clearance: 83.3 mL/min (A) (by C-G formula based on SCr of 0.5 mg/dL (L)).   Medications:  Scheduled:   divalproex  125 mg Oral BID   Ensure  237 mL Oral BID BM   heparin  1,000 Units Intravenous Once   ipratropium  2 puff Inhalation Q4H   iron polysaccharides  150 mg Oral Q1200   lactulose  10 g Oral Daily   loratadine  10 mg Oral Daily   methylPREDNISolone (SOLU-MEDROL) injection  80 mg Intravenous Q24H   tretinoin  1 application Topical See admin instructions    Assessment: PMH relevant for cerebral palsy with quadriplegia, right BKA, chronic sacral wound, hyperlipidemia, bipolar disorder, gastric ulcer with bleeding, iron deficiency anemia, aggressive compulsive disorder, hx of DVT not on AC. Pharmacy was consulted to manage heparin infusion for atrial thrombus  Goal of Therapy:  Heparin level 0.3-0.7 units/ml Monitor platelets by anticoagulation protocol: Yes    9/28 @ 0301  HL = 0.43, therapeutic 2 1   9/28 @ 1112  HL = 0.30, therapeutic x 2  9/29 @ 0517  HL = 0.20, subtherapeutic   Plan:  9/29:   HL @ 0517 = 0.20 Will order Heparin 1000 units IV X 1 and increase drip rate to 1150 units/hr. Will recheck HL 6 hrs after rate change.   02/22/2021 6:07 AM

## 2021-02-22 NOTE — Progress Notes (Signed)
ANTICOAGULATION CONSULT NOTE - Follow Up Consult  Pharmacy Consult for Heparin infusion Indication:  atrial thrombus  Allergies  Allergen Reactions   Adhesive [Tape] Rash    Patient Measurements: Weight: 60 kg (132 lb 4.4 oz) Heparin Dosing Weight: 65 kg  Vital Signs: Temp: 97.9 F (36.6 C) (09/29 0324) Temp Source: Oral (09/29 0324) BP: 120/81 (09/29 0324) Pulse Rate: 71 (09/29 0324)  Labs: Recent Labs    02/20/21 1215 02/20/21 1432 02/21/21 0301 02/21/21 0654 02/21/21 1035 02/22/21 0517  HGB 13.3  --   --  11.3*  --   --   HCT 39.8  --   --  33.9*  --   --   PLT 257  --   --  234  --   --   APTT  --   --  118*  --   --   --   LABPROT 13.8  --   --   --   --   --   INR 1.1  --   --   --   --   --   HEPARINUNFRC  --   --  0.43  --  0.30 0.20*  CREATININE 0.73  --   --  0.50*  --   --   TROPONINIHS  --  13  --   --   --   --      Estimated Creatinine Clearance: 83.3 mL/min (A) (by C-G formula based on SCr of 0.5 mg/dL (L)).   Medications:  Scheduled:   divalproex  125 mg Oral BID   Ensure  237 mL Oral BID BM   ipratropium  2 puff Inhalation Q4H   iron polysaccharides  150 mg Oral Q1200   lactulose  10 g Oral Daily   loratadine  10 mg Oral Daily   methylPREDNISolone (SOLU-MEDROL) injection  80 mg Intravenous Q24H   tretinoin  1 application Topical See admin instructions    Assessment: PMH relevant for cerebral palsy with quadriplegia, right BKA, chronic sacral wound, hyperlipidemia, bipolar disorder, gastric ulcer with bleeding, iron deficiency anemia, aggressive compulsive disorder, hx of DVT not on AC. Pharmacy was consulted to manage heparin infusion for atrial thrombus  Goal of Therapy:  Heparin level 0.3-0.7 units/ml Monitor platelets by anticoagulation protocol: Yes    9/28 @ 0301  HL = 0.43, therapeutic 2 1   9/28 @ 1112  HL = 0.30, therapeutic x 2  9/29 @ 0517  HL = 0.20, subtherapeutic   9/29 @ 1246  HL = 0.43, therapeutic x1  Plan:  Will  continue current rate at 1150 units/hr Will recheck HL 6 hrs to confirm therapeutic  Daily CBC while on heparin drip  Doloris Hall, PharmD Pharmacy Resident  02/22/2021 2:39 PM

## 2021-02-23 DIAGNOSIS — J1282 Pneumonia due to coronavirus disease 2019: Secondary | ICD-10-CM | POA: Diagnosis not present

## 2021-02-23 DIAGNOSIS — U071 COVID-19: Secondary | ICD-10-CM | POA: Diagnosis not present

## 2021-02-23 LAB — CBC
HCT: 33.1 % — ABNORMAL LOW (ref 39.0–52.0)
Hemoglobin: 10.9 g/dL — ABNORMAL LOW (ref 13.0–17.0)
MCH: 24.2 pg — ABNORMAL LOW (ref 26.0–34.0)
MCHC: 32.9 g/dL (ref 30.0–36.0)
MCV: 73.6 fL — ABNORMAL LOW (ref 80.0–100.0)
Platelets: 330 10*3/uL (ref 150–400)
RBC: 4.5 MIL/uL (ref 4.22–5.81)
RDW: 19.5 % — ABNORMAL HIGH (ref 11.5–15.5)
WBC: 9.9 10*3/uL (ref 4.0–10.5)
nRBC: 0.7 % — ABNORMAL HIGH (ref 0.0–0.2)

## 2021-02-23 LAB — CULTURE, BLOOD (ROUTINE X 2): Special Requests: ADEQUATE

## 2021-02-23 LAB — LEGIONELLA PNEUMOPHILA SEROGP 1 UR AG: L. pneumophila Serogp 1 Ur Ag: NEGATIVE

## 2021-02-23 MED ORDER — CHLORHEXIDINE GLUCONATE CLOTH 2 % EX PADS
6.0000 | MEDICATED_PAD | Freq: Every day | CUTANEOUS | Status: DC
  Administered 2021-02-23 – 2021-02-24 (×2): 6 via TOPICAL

## 2021-02-23 MED ORDER — ENOXAPARIN SODIUM 40 MG/0.4ML IJ SOSY
40.0000 mg | PREFILLED_SYRINGE | INTRAMUSCULAR | Status: DC
  Administered 2021-02-23 – 2021-03-02 (×8): 40 mg via SUBCUTANEOUS
  Filled 2021-02-23 (×7): qty 0.4

## 2021-02-23 NOTE — Progress Notes (Signed)
PROGRESS NOTE    [name]   NWG:[id number]  DOB: 03/27/1961  DOA: 02/20/2021 PCP: [place], MD   Brief Narrative:  [name] is a 60 y.o. male who presents from [facility] with medical history significant of  cerebral palsy with quadriplegia except use of left arm, right BKA, chronic sacral wound, hyperlipidemia, anxiety, bipolar disorder, gastric ulcer with bleeding, iron deficiency anemia, obsessive compulsive disorder, DVT not on AC, who presents with nausea, vomiting, abdominal pain, fever, left leg pain, cough. In the ED was found to have a temperature of 103.3.  He had erythema on his left lower leg consistent with cellulitis.  Blood pressure was 80s over 50s and improved after giving multiple fluid boluses.  Rested positive for COVID-19.   Subjective: He has no complaints today.     Assessment & Plan:   Principal Problem:   Pneumonia due to COVID-19 virus-severe sepsis -Continue remdesivir and IV steroids- Remdesivir stop date 10/2 -of note, 1 set of blood cultures has grown out contaminants (staph capitis and viridans strep)  Active Problems: Nausea vomiting and diarrhea - Likely related to acute COVID infection and now appears to be improving  ?  Atrial thrombus noted on CT - 2D echo does not reveal a thrombus-  - no DVT on vascular duplex - cardiology input requested- cardiology recommended to stop Heparin as finding on CT was likely an artifact  Cellulitis of left leg - Leg remains erythematous and swollen- it seems that it is being kept in a dependent position and this is due to venous stasis- there are blisters on the leg today- I have requested an Unna boot to be placed- will dc antibiotics today.   Dehydration - Started on IV fluids yesterday (Ringer's lactate at 100 cc an hour) - Per RN, the patient is making very little urine-I will give him a 500 cc fluid bolus today - Continue to follow I's and O's-I have asked RN to push  fluids  Iron deficiency anemia - Continue iron capsule daily    Cerebral palsy, quadriplegic (HCC) -Needs total care  Pressure injuries - Stage III pressure injury on sacrum - Stage II pressure injury on left medial knee  Obsessive compulsive disorder   Time spent in minutes: 35 DVT prophylaxis: enoxaparin (LOVENOX) injection 40 mg Start: 02/23/21 1330  Lovenox Code Status: Full code Family Communication:  Level of Care: Level of care: Progressive Cardiac Disposition Plan:  Status is: Inpatient  Remains inpatient appropriate because:IV treatments appropriate due to intensity of illness or inability to take PO  Dispo: The patient is from: [facility]              Anticipated d/c is to:  house              Patient currently is not medically stable to d/c.   Difficult to place patient No      Consultants:  None Procedures:  None Antimicrobials:  Anti-infectives (From admission, onward)    Start     Dose/Rate Route Frequency Ordered Stop   02/23/21 0600  ceFAZolin (ANCEF) IVPB 1 g/50 mL premix        1 g 100 mL/hr over 30 Minutes Intravenous Every 8 hours 02/22/21 1458     02/22/21 1415  ceFAZolin (ANCEF) IVPB 1 g/50 mL premix  Status:  Discontinued        1 g 100 mL/hr over 30 Minutes Intravenous Every 8 hours 02/22/21 1322 02/22/21 1458  02/21/21 1000  remdesivir 100 mg in sodium chloride 0.9 % 100 mL IVPB       See Hyperspace for full Linked Orders Report.   100 mg 200 mL/hr over 30 Minutes Intravenous Daily 02/20/21 1526 02/25/21 0959   02/21/21 0600  cefTRIAXone (ROCEPHIN) 2 g in sodium chloride 0.9 % 100 mL IVPB  Status:  Discontinued        2 g 200 mL/hr over 30 Minutes Intravenous Every 24 hours 02/21/21 0119 02/22/21 1322   02/21/21 0400  vancomycin (VANCOREADY) IVPB 750 mg/150 mL  Status:  Discontinued        750 mg 150 mL/hr over 60 Minutes Intravenous Every 12 hours 02/20/21 1721 02/21/21 0118   02/20/21 1730  ceFEPIme (MAXIPIME) 2 g in  sodium chloride 0.9 % 100 mL IVPB  Status:  Discontinued        2 g 200 mL/hr over 30 Minutes Intravenous Every 8 hours 02/20/21 1721 02/21/21 0118   02/20/21 1530  remdesivir 200 mg in sodium chloride 0.9% 250 mL IVPB       See Hyperspace for full Linked Orders Report.   200 mg 580 mL/hr over 30 Minutes Intravenous Once 02/20/21 1526 02/20/21 1737   02/20/21 1330  ceFEPIme (MAXIPIME) 2 g in sodium chloride 0.9 % 100 mL IVPB        2 g 200 mL/hr over 30 Minutes Intravenous  Once 02/20/21 1323 02/20/21 1426   02/20/21 1330  metroNIDAZOLE (FLAGYL) IVPB 500 mg        500 mg 100 mL/hr over 60 Minutes Intravenous  Once 02/20/21 1323 02/20/21 1540   02/20/21 1330  vancomycin (VANCOCIN) IVPB 1000 mg/200 mL premix        1,000 mg 200 mL/hr over 60 Minutes Intravenous  Once 02/20/21 1323 02/20/21 1700        Objective: Vitals:   02/23/21 0051 02/23/21 0358 02/23/21 0819 02/23/21 1228  BP: (!) 153/87 (!) 125/59 (!) 148/70 (P) 140/66  Pulse: 96  68 (P) 70  Resp:  18 17 (P) 17  Temp: 97.7 F (36.5 C) 98.5 F (36.9 C) 98.6 F (37 C) (P) 98.4 F (36.9 C)  TempSrc: Oral Oral Oral (P) Oral  SpO2: 99% 97% 100% (P) 100%  Weight:        Intake/Output Summary (Last 24 hours) at 02/23/2021 1447 Last data filed at 02/23/2021 1259 Gross per 24 hour  Intake 1893.69 ml  Output 1000 ml  Net 893.69 ml    Filed Weights   02/20/21 1622 02/22/21 0324  Weight: 65 kg 60 kg    Examination: General exam: Appears comfortable  HEENT: PERRLA, oral mucosa moist, no sclera icterus or thrush Respiratory system: Clear to auscultation. Respiratory effort normal. Cardiovascular system: S1 & S2 heard, regular rate and rhythm Gastrointestinal system: Abdomen soft, non-tender, nondistended. Normal bowel sounds   Central nervous system: Alert and oriented. 0/5 strength in LE, 3/5 in UE Extremities: No cyanosis, clubbing- erythema, edema blistering and weeping of LLE noted Skin: No rashes or  ulcers Psychiatry:  Mood & affect appropriate.      Data Reviewed: I have personally reviewed following labs and imaging studies  CBC: Recent Labs  Lab 02/20/21 1215 02/21/21 0654 02/23/21 0542  WBC 17.6* 12.4* 9.9  NEUTROABS 16.1* 10.9*  --   HGB 13.3 11.3* 10.9*  HCT 39.8 33.9* 33.1*  MCV 74.7* 75.0* 73.6*  PLT 257 234 330    Basic Metabolic Panel: Recent Labs  Lab 02/20/21 1215  02/20/21 1432 02/21/21 0654 02/22/21 0517  NA 138  --  138 136  K 3.7  --  4.1 4.2  CL 101  --  104 104  CO2 25  --  24 23  GLUCOSE 132*  --  128* 131*  BUN 26*  --  22* 20  CREATININE 0.73  --  0.50* 0.43*  CALCIUM 9.5  --  9.0 8.6*  MG  --  1.8  --   --     GFR: Estimated Creatinine Clearance: 83.3 mL/min (A) (by C-G formula based on SCr of 0.43 mg/dL (L)). Liver Function Tests: Recent Labs  Lab 02/20/21 1215 02/21/21 0654 02/22/21 0517  AST 79* 37 32  ALT 131* 77* 57*  ALKPHOS 169* 118 98  BILITOT 0.7 0.6 0.6  PROT 7.9 6.4* 5.8*  ALBUMIN 3.1* 2.4* 2.1*    Recent Labs  Lab 02/20/21 1215  LIPASE 22    No results for input(s): AMMONIA in the last 168 hours. Coagulation Profile: Recent Labs  Lab 02/20/21 1215  INR 1.1    Cardiac Enzymes: No results for input(s): CKTOTAL, CKMB, CKMBINDEX, TROPONINI in the last 168 hours. BNP (last 3 results) No results for input(s): PROBNP in the last 8760 hours. HbA1C: No results for input(s): HGBA1C in the last 72 hours. CBG: Recent Labs  Lab 02/22/21 0937  GLUCAP 110*    Lipid Profile: No results for input(s): CHOL, HDL, LDLCALC, TRIG, CHOLHDL, LDLDIRECT in the last 72 hours. Thyroid Function Tests: No results for input(s): TSH, T4TOTAL, FREET4, T3FREE, THYROIDAB in the last 72 hours. Anemia Panel: Recent Labs    02/21/21 0654  FERRITIN 190    Urine analysis:    Component Value Date/Time   COLORURINE YELLOW (A) 02/20/2021 1432   APPEARANCEUR CLEAR (A) 02/20/2021 1432   LABSPEC >1.046 (H) 02/20/2021 1432    PHURINE 6.0 02/20/2021 1432   GLUCOSEU NEGATIVE 02/20/2021 1432   HGBUR NEGATIVE 02/20/2021 1432   HGBUR negative 06/14/2010 1119   BILIRUBINUR NEGATIVE 02/20/2021 1432   BILIRUBINUR SMALL 01/04/2016 1634   KETONESUR NEGATIVE 02/20/2021 1432   PROTEINUR 100 (A) 02/20/2021 1432   UROBILINOGEN 2.0 (H) 02/05/2018 1825   NITRITE NEGATIVE 02/20/2021 1432   LEUKOCYTESUR NEGATIVE 02/20/2021 1432   Sepsis Labs: @LABRCNTIP (procalcitonin:4,lacticidven:4) ) Recent Results (from the past 240 hour(s))  Culture, blood (Routine x 2)     Status: Abnormal   Collection Time: 02/20/21 12:30 PM   Specimen: BLOOD  Result Value Ref Range Status   Specimen Description   Final    BLOOD LEFT ANTECUBITAL Performed at [facility], [address], [place], [place]    Special Requests   Final    BOTTLES DRAWN AEROBIC AND ANAEROBIC Blood Culture adequate volume Performed at [facility], [address] [place], [place]    Culture  Setup Time   Final    GRAM POSITIVE COCCI IN BOTH AEROBIC AND ANAEROBIC BOTTLES CRITICAL RESULT CALLED TO, READ BACK BY AND VERIFIED WITH: JASON ROBBINS PHARMD 0106 02/21/21 HNM    Culture (A)  Final    STAPHYLOCOCCUS CAPITIS VIRIDANS STREPTOCOCCUS THE SIGNIFICANCE OF ISOLATING THIS ORGANISM FROM A SINGLE SET OF BLOOD CULTURES WHEN MULTIPLE SETS ARE DRAWN IS UNCERTAIN. PLEASE NOTIFY THE MICROBIOLOGY DEPARTMENT WITHIN ONE WEEK IF SPECIATION AND SENSITIVITIES ARE REQUIRED. Performed at [facility], [address] [place], [place]    Report Status 02/23/2021 FINAL  Final  Blood Culture ID Panel (Reflexed)  Status: Abnormal   Collection Time: 02/20/21 12:30 PM  Result Value Ref Range Status   Enterococcus faecalis NOT DETECTED NOT DETECTED Final   Enterococcus Faecium NOT DETECTED NOT DETECTED Final   Listeria monocytogenes NOT DETECTED NOT DETECTED Final   Staphylococcus species NOT DETECTED NOT DETECTED Final    Staphylococcus aureus (BCID) NOT DETECTED NOT DETECTED Final   Staphylococcus epidermidis NOT DETECTED NOT DETECTED Final   Staphylococcus lugdunensis NOT DETECTED NOT DETECTED Final   Streptococcus species DETECTED (A) NOT DETECTED Final    Comment: Not Enterococcus species, Streptococcus agalactiae, Streptococcus pyogenes, or Streptococcus pneumoniae. CRITICAL RESULT CALLED TO, READ BACK BY AND VERIFIED WITH: JASON ROBBINS PHARMD 0106 02/21/21 HNM    Streptococcus agalactiae NOT DETECTED NOT DETECTED Final   Streptococcus pneumoniae NOT DETECTED NOT DETECTED Final   Streptococcus pyogenes NOT DETECTED NOT DETECTED Final   A.calcoaceticus-baumannii NOT DETECTED NOT DETECTED Final   Bacteroides fragilis NOT DETECTED NOT DETECTED Final   Enterobacterales NOT DETECTED NOT DETECTED Final   Enterobacter cloacae complex NOT DETECTED NOT DETECTED Final   Escherichia coli NOT DETECTED NOT DETECTED Final   Klebsiella aerogenes NOT DETECTED NOT DETECTED Final   Klebsiella oxytoca NOT DETECTED NOT DETECTED Final   Klebsiella pneumoniae NOT DETECTED NOT DETECTED Final   Proteus species NOT DETECTED NOT DETECTED Final   Salmonella species NOT DETECTED NOT DETECTED Final   Serratia marcescens NOT DETECTED NOT DETECTED Final   Haemophilus influenzae NOT DETECTED NOT DETECTED Final   Neisseria meningitidis NOT DETECTED NOT DETECTED Final   Pseudomonas aeruginosa NOT DETECTED NOT DETECTED Final   Stenotrophomonas maltophilia NOT DETECTED NOT DETECTED Final   Candida albicans NOT DETECTED NOT DETECTED Final   Candida auris NOT DETECTED NOT DETECTED Final   Candida glabrata NOT DETECTED NOT DETECTED Final   Candida krusei NOT DETECTED NOT DETECTED Final   Candida parapsilosis NOT DETECTED NOT DETECTED Final   Candida tropicalis NOT DETECTED NOT DETECTED Final   Cryptococcus neoformans/gattii NOT DETECTED NOT DETECTED Final    Comment: Performed at [facility], [address]  [place], [place] [address]  Culture, blood (Routine x 2)     Status: None (Preliminary result)   Collection Time: 02/20/21 12:33 PM   Specimen: BLOOD  Result Value Ref Range Status   Specimen Description BLOOD LEFT ANTECUBITAL  Final   Special Requests   Final    BOTTLES DRAWN AEROBIC AND ANAEROBIC Blood Culture adequate volume   Culture   Final    NO GROWTH 3 DAYS Performed at [facility], [address] [place], [place] [address]    Report Status PENDING  Incomplete  Resp Panel by RT-PCR (Flu A&B, Covid) Nasopharyngeal Swab     Status: Abnormal   Collection Time: 02/20/21  1:05 PM   Specimen: Nasopharyngeal Swab; Nasopharyngeal(NP) swabs in vial transport medium  Result Value Ref Range Status   SARS Coronavirus 2 by RT PCR POSITIVE (A) NEGATIVE Final    Comment: RESULT CALLED TO, READ BACK BY AND VERIFIED WITH: LEXI OLIVER 02/20/21 1424 KLW (NOTE) SARS-CoV-2 target nucleic acids are DETECTED.  The SARS-CoV-2 RNA is generally detectable in upper respiratory specimens during the acute phase of infection. Positive results are indicative of the presence of the identified virus, but do not rule out bacterial infection or co-infection with other pathogens not detected by the test. Clinical correlation with patient history and other diagnostic information is necessary to determine patient infection status. The expected result is Negative.  Fact Sheet  for Patients: BloggerCourse.com  Fact Sheet for Healthcare Providers: SeriousBroker.it  This test is not yet approved or cleared by the [place] FDA and  has been authorized for detection and/or diagnosis of SARS-CoV-2 by FDA under an Emergency Use Authorization (EUA).  This EUA will remain in effect (meaning this test can be used ) for the duration of  the COVID-19 declaration under Section 564(b)(1) of the Act, 21 U.S.C. section 360bbb-3(b)(1), unless the authorization  is terminated or revoked sooner.     Influenza A by PCR NEGATIVE NEGATIVE Final   Influenza B by PCR NEGATIVE NEGATIVE Final    Comment: (NOTE) The Xpert Xpress SARS-CoV-2/FLU/RSV plus assay is intended as an aid in the diagnosis of influenza from Nasopharyngeal swab specimens and should not be used as a sole basis for treatment. Nasal washings and aspirates are unacceptable for Xpert Xpress SARS-CoV-2/FLU/RSV testing.  Fact Sheet for Patients: BloggerCourse.com  Fact Sheet for Healthcare Providers: SeriousBroker.it  This test is not yet approved or cleared by the [place] FDA and has been authorized for detection and/or diagnosis of SARS-CoV-2 by FDA under an Emergency Use Authorization (EUA). This EUA will remain in effect (meaning this test can be used) for the duration of the COVID-19 declaration under Section 564(b)(1) of the Act, 21 U.S.C. section 360bbb-3(b)(1), unless the authorization is terminated or revoked.  Performed at [facility], [address], [place], [place] [address]   Urine Culture     Status: None   Collection Time: 02/20/21  2:32 PM   Specimen: Urine, Random  Result Value Ref Range Status   Specimen Description   Final    URINE, RANDOM Performed at [facility], [address], [place], [place] [address]    Special Requests   Final    NONE Performed at [facility], [address] [place], [place] [address]    Culture   Final    NO GROWTH Performed at [facility], [address], [place], [place] [address]    Report Status 02/21/2021 FINAL  Final  MRSA Next Gen by PCR, Nasal     Status: None   Collection Time: 02/22/21  1:44 AM   Specimen: Nasal Mucosa; Nasal Swab  Result Value Ref Range Status   MRSA by PCR Next Gen NOT DETECTED NOT DETECTED Final    Comment: (NOTE) The GeneXpert MRSA Assay (FDA approved for NASAL specimens only), is one component  of a comprehensive MRSA colonization surveillance program. It is not intended to diagnose MRSA infection nor to guide or monitor treatment for MRSA infections. Test performance is not FDA approved in patients less than 25 years old. Performed at [facility], [address] [place], [place] [address]          Radiology Studies: No results found.    Scheduled Meds:  Chlorhexidine Gluconate Cloth  6 each Topical Daily   divalproex  125 mg Oral BID   enoxaparin (LOVENOX) injection  40 mg Subcutaneous Q24H   Ensure  237 mL Oral BID BM   ipratropium  2 puff Inhalation Q4H   iron polysaccharides  150 mg Oral Q1200   lactulose  10 g Oral Daily   loratadine  10 mg Oral Daily   methylPREDNISolone (SOLU-MEDROL) injection  40 mg Intravenous Q24H   tretinoin  1 application Topical See admin instructions   Continuous Infusions:   ceFAZolin (ANCEF) IV 1 g (02/23/21 0556)   lactated ringers 100 mL/hr at 02/23/21 0545  remdesivir 100 mg in NS 100 mL 100 mg (02/23/21 1016)     LOS: 3 days      Calvert Cantor, MD [facility] Pager: [email or web address] 02/23/2021, 2:47 PM

## 2021-02-23 NOTE — Consult Note (Addendum)
WOC consult was previously performed for sacrum wound on 9/28.   Pt is in isolation for Covid. Requested today to apply Una boot to left leg by cardiology team.  Pt has a right BKA. Left leg with generalized edema and erythemia and weeping mod amt yellow drainage.  No open wounds.  Applied [employer] and coban to left leg. WOC team will plan to change Q Thurs while patient is in the hospital. Thank-you,  Cammie Mcgee MSN, RN, CWOCN, [place], CNS [phone]

## 2021-02-24 DIAGNOSIS — U071 COVID-19: Secondary | ICD-10-CM | POA: Diagnosis not present

## 2021-02-24 DIAGNOSIS — J1282 Pneumonia due to coronavirus disease 2019: Secondary | ICD-10-CM | POA: Diagnosis not present

## 2021-02-24 LAB — GLUCOSE, CAPILLARY: Glucose-Capillary: 88 mg/dL (ref 70–99)

## 2021-02-24 NOTE — Progress Notes (Signed)
PROGRESS NOTE    [name]   JXB:[id number]  DOB: April 29, 1961  DOA: 02/20/2021 PCP: [name] Forest, MD   Brief Narrative:  [name] is a 60 y.o. 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 In the ED was found to have a temperature of 103.3.  He had erythema on his left lower leg consistent with cellulitis.  Blood pressure was 80s over 50s and improved after giving multiple fluid boluses.  Rested positive for COVID-19.   Subjective: He has no complaints today.     Assessment & Plan:   Principal Problem:   Pneumonia due to COVID-19 virus-severe sepsis -Continue remdesivir and IV steroids- Remdesivir stop date 10/2 -of note, 1 set of blood cultures has grown out contaminants (staph capitis and viridans strep)  Active Problems: Nausea vomiting and diarrhea - Likely related to acute COVID infection and has improved. Eating and drinking w/o difficulty  ?  Atrial thrombus noted on CT - 2D echo does not reveal a thrombus-  - no DVT on vascular duplex - cardiology input requested- cardiology recommended to stop Heparin as finding on CT was likely an artifact  Cellulitis of left leg - given Vancomycin, Flagyl and Ceftriaxone- later changed to Cefazolin - Leg remains erythematous and swollen- it seems that it is being kept in a dependent position and this is due to venous stasis- there are blisters on the leg today- I have requested an Unna boot to be placed  - Keflex discontinued on 9/30  Dehydration - Started on IV fluids yesterday (Ringer's lactate at 100 cc an hour) - Per RN, the patient is making very little urine-I will give him  a 500 cc fluid bolus today - Continue to follow I's and O's-I have asked RN to push fluids  Iron deficiency anemia - Continue iron capsule daily    Cerebral palsy, quadriplegic (HCC) -Needs total care  Pressure injuries - Stage III pressure injury on sacrum - Stage II pressure injury on left medial knee  Obsessive compulsive disorder   Time spent in minutes: 35 DVT prophylaxis: enoxaparin (LOVENOX) injection 40 mg Start: 02/23/21 1330  Lovenox Code Status: Full code Family Communication:  Level of Care: Level of care: Progressive Cardiac Disposition Plan:  Status is: Inpatient  Remains inpatient appropriate because:IV treatments appropriate due to intensity of illness or inability to take PO  Dispo: The patient is from: [place]              Anticipated d/c is to: [facility]              Patient currently is not medically stable to d/c.   Difficult to place patient No      Consultants:  None Procedures:  None Antimicrobials:  Anti-infectives (From admission, onward)    Start     Dose/Rate Route Frequency Ordered Stop   02/23/21 0600  ceFAZolin (ANCEF) IVPB 1 g/50 mL premix        1 g 100 mL/hr over 30 Minutes Intravenous Every 8 hours 02/22/21 1458     02/22/21 1415  ceFAZolin (ANCEF) IVPB 1 g/50 mL premix  Status:  Discontinued        1 g 100 mL/hr  over 30 Minutes Intravenous Every 8 hours 02/22/21 1322 02/22/21 1458   02/21/21 1000  remdesivir 100 mg in sodium chloride 0.9 % 100 mL IVPB       See Hyperspace for full Linked Orders Report.   100 mg 200 mL/hr over 30 Minutes Intravenous Daily 02/20/21 1526 02/24/21 0936   02/21/21 0600  cefTRIAXone (ROCEPHIN) 2 g in sodium chloride 0.9 % 100 mL IVPB  Status:  Discontinued        2 g 200 mL/hr over 30 Minutes Intravenous Every 24 hours 02/21/21 0119 02/22/21 1322   02/21/21 0400  vancomycin (VANCOREADY) IVPB 750 mg/150 mL  Status:  Discontinued        750 mg 150 mL/hr over 60 Minutes Intravenous Every  12 hours 02/20/21 1721 02/21/21 0118   02/20/21 1730  ceFEPIme (MAXIPIME) 2 g in sodium chloride 0.9 % 100 mL IVPB  Status:  Discontinued        2 g 200 mL/hr over 30 Minutes Intravenous Every 8 hours 02/20/21 1721 02/21/21 0118   02/20/21 1530  remdesivir 200 mg in sodium chloride 0.9% 250 mL IVPB       See Hyperspace for full Linked Orders Report.   200 mg 580 mL/hr over 30 Minutes Intravenous Once 02/20/21 1526 02/20/21 1737   02/20/21 1330  ceFEPIme (MAXIPIME) 2 g in sodium chloride 0.9 % 100 mL IVPB        2 g 200 mL/hr over 30 Minutes Intravenous  Once 02/20/21 1323 02/20/21 1426   02/20/21 1330  metroNIDAZOLE (FLAGYL) IVPB 500 mg        500 mg 100 mL/hr over 60 Minutes Intravenous  Once 02/20/21 1323 02/20/21 1540   02/20/21 1330  vancomycin (VANCOCIN) IVPB 1000 mg/200 mL premix        1,000 mg 200 mL/hr over 60 Minutes Intravenous  Once 02/20/21 1323 02/20/21 1700        Objective: Vitals:   02/23/21 2317 02/24/21 0400 02/24/21 0700 02/24/21 1138  BP: 119/79 (!) 137/95 (!) 144/88 (!) 146/99  Pulse:  97 81   Resp: 20 18 17    Temp: 99.2 F (37.3 C) 98.8 F (37.1 C) 98.7 F (37.1 C) 98.8 F (37.1 C)  TempSrc: Axillary Axillary Axillary Axillary  SpO2: 100%   96%  Weight:        Intake/Output Summary (Last 24 hours) at 02/24/2021 1509 Last data filed at 02/24/2021 1141 Gross per 24 hour  Intake 2058.1 ml  Output 1750 ml  Net 308.1 ml    Filed Weights   02/20/21 1622 02/22/21 0324  Weight: 65 kg 60 kg    Examination: General exam: Appears comfortable  HEENT: PERRLA, oral mucosa moist, no sclera icterus or thrush Respiratory system: Clear to auscultation. Respiratory effort normal. Cardiovascular system: S1 & S2 heard, regular rate and rhythm Gastrointestinal system: Abdomen soft, non-tender, nondistended. Normal bowel sounds   Central nervous system: Alert and oriented. 3/5 strength in UE, contracture noted in hands- 0/5 strength in LE.  Extremities: No  cyanosis, clubbing or edema Skin: No rashes or ulcers Psychiatry:  Mood & affect appropriate.       Data Reviewed: I have personally reviewed following labs and imaging studies  CBC: Recent Labs  Lab 02/20/21 1215 02/21/21 0654 02/23/21 0542  WBC 17.6* 12.4* 9.9  NEUTROABS 16.1* 10.9*  --   HGB 13.3 11.3* 10.9*  HCT 39.8 33.9* 33.1*  MCV 74.7* 75.0* 73.6*  PLT 257 234 330    Basic  Metabolic Panel: Recent Labs  Lab 02/20/21 1215 02/20/21 1432 02/21/21 0654 02/22/21 0517  NA 138  --  138 136  K 3.7  --  4.1 4.2  CL 101  --  104 104  CO2 25  --  24 23  GLUCOSE 132*  --  128* 131*  BUN 26*  --  22* 20  CREATININE 0.73  --  0.50* 0.43*  CALCIUM 9.5  --  9.0 8.6*  MG  --  1.8  --   --     GFR: Estimated Creatinine Clearance: 83.3 mL/min (A) (by C-G formula based on SCr of 0.43 mg/dL (L)). Liver Function Tests: Recent Labs  Lab 02/20/21 1215 02/21/21 0654 02/22/21 0517  AST 79* 37 32  ALT 131* 77* 57*  ALKPHOS 169* 118 98  BILITOT 0.7 0.6 0.6  PROT 7.9 6.4* 5.8*  ALBUMIN 3.1* 2.4* 2.1*    Recent Labs  Lab 02/20/21 1215  LIPASE 22    No results for input(s): AMMONIA in the last 168 hours. Coagulation Profile: Recent Labs  Lab 02/20/21 1215  INR 1.1    Cardiac Enzymes: No results for input(s): CKTOTAL, CKMB, CKMBINDEX, TROPONINI in the last 168 hours. BNP (last 3 results) No results for input(s): PROBNP in the last 8760 hours. HbA1C: No results for input(s): HGBA1C in the last 72 hours. CBG: Recent Labs  Lab 02/22/21 0937 02/24/21 1011  GLUCAP 110* 88    Lipid Profile: No results for input(s): CHOL, HDL, LDLCALC, TRIG, CHOLHDL, LDLDIRECT in the last 72 hours. Thyroid Function Tests: No results for input(s): TSH, T4TOTAL, FREET4, T3FREE, THYROIDAB in the last 72 hours. Anemia Panel: No results for input(s): VITAMINB12, FOLATE, FERRITIN, TIBC, IRON, RETICCTPCT in the last 72 hours.  Urine analysis:    Component Value Date/Time    COLORURINE YELLOW (A) 02/20/2021 1432   APPEARANCEUR CLEAR (A) 02/20/2021 1432   LABSPEC >1.046 (H) 02/20/2021 1432   PHURINE 6.0 02/20/2021 1432   GLUCOSEU NEGATIVE 02/20/2021 1432   HGBUR NEGATIVE 02/20/2021 1432   HGBUR negative 06/14/2010 1119   BILIRUBINUR NEGATIVE 02/20/2021 1432   BILIRUBINUR SMALL 01/04/2016 1634   KETONESUR NEGATIVE 02/20/2021 1432   PROTEINUR 100 (A) 02/20/2021 1432   UROBILINOGEN 2.0 (H) 02/05/2018 1825   NITRITE NEGATIVE 02/20/2021 1432   LEUKOCYTESUR NEGATIVE 02/20/2021 1432   Sepsis Labs: (procalcitonin:4,lacticidven:4) ) Recent Results (from the past 240 hour(s))  Culture, blood (Routine x 2)     Status: Abnormal   Collection Time: 02/20/21 12:30 PM   Specimen: BLOOD  Result Value Ref Range Status   Specimen Description   Final    BLOOD LEFT ANTECUBITAL Performed at [facility], [address], [place], [place] [address]    Special Requests   Final    BOTTLES DRAWN AEROBIC AND ANAEROBIC Blood Culture adequate volume Performed at [facility], [address] [place], [place] [address]    Culture  Setup Time   Final    GRAM POSITIVE COCCI IN BOTH AEROBIC AND ANAEROBIC BOTTLES CRITICAL RESULT CALLED TO, READ BACK BY AND VERIFIED WITH: JASON ROBBINS PHARMD 0106 02/21/21 HNM    Culture (A)  Final    STAPHYLOCOCCUS CAPITIS VIRIDANS STREPTOCOCCUS THE SIGNIFICANCE OF ISOLATING THIS ORGANISM FROM A SINGLE SET OF BLOOD CULTURES WHEN MULTIPLE SETS ARE DRAWN IS UNCERTAIN. PLEASE NOTIFY THE MICROBIOLOGY DEPARTMENT WITHIN ONE WEEK IF SPECIATION AND SENSITIVITIES ARE REQUIRED. Performed at [facility], [address] [place], [place] [address]    Report  Status 02/23/2021 FINAL  Final  Blood Culture ID Panel (Reflexed)     Status: Abnormal   Collection Time: 02/20/21 12:30 PM  Result Value Ref Range Status   Enterococcus faecalis NOT DETECTED NOT DETECTED Final   Enterococcus Faecium NOT DETECTED NOT DETECTED Final    Listeria monocytogenes NOT DETECTED NOT DETECTED Final   Staphylococcus species NOT DETECTED NOT DETECTED Final   Staphylococcus aureus (BCID) NOT DETECTED NOT DETECTED Final   Staphylococcus epidermidis NOT DETECTED NOT DETECTED Final   Staphylococcus lugdunensis NOT DETECTED NOT DETECTED Final   Streptococcus species DETECTED (A) NOT DETECTED Final    Comment: Not Enterococcus species, Streptococcus agalactiae, Streptococcus pyogenes, or Streptococcus pneumoniae. CRITICAL RESULT CALLED TO, READ BACK BY AND VERIFIED WITH: JASON ROBBINS PHARMD 0106 02/21/21 HNM    Streptococcus agalactiae NOT DETECTED NOT DETECTED Final   Streptococcus pneumoniae NOT DETECTED NOT DETECTED Final   Streptococcus pyogenes NOT DETECTED NOT DETECTED Final   A.calcoaceticus-baumannii NOT DETECTED NOT DETECTED Final   Bacteroides fragilis NOT DETECTED NOT DETECTED Final   Enterobacterales NOT DETECTED NOT DETECTED Final   Enterobacter cloacae complex NOT DETECTED NOT DETECTED Final   Escherichia coli NOT DETECTED NOT DETECTED Final   Klebsiella aerogenes NOT DETECTED NOT DETECTED Final   Klebsiella oxytoca NOT DETECTED NOT DETECTED Final   Klebsiella pneumoniae NOT DETECTED NOT DETECTED Final   Proteus species NOT DETECTED NOT DETECTED Final   Salmonella species NOT DETECTED NOT DETECTED Final   Serratia marcescens NOT DETECTED NOT DETECTED Final   Haemophilus influenzae NOT DETECTED NOT DETECTED Final   Neisseria meningitidis NOT DETECTED NOT DETECTED Final   Pseudomonas aeruginosa NOT DETECTED NOT DETECTED Final   Stenotrophomonas maltophilia NOT DETECTED NOT DETECTED Final   Candida albicans NOT DETECTED NOT DETECTED Final   Candida auris NOT DETECTED NOT DETECTED Final   Candida glabrata NOT DETECTED NOT DETECTED Final   Candida krusei NOT DETECTED NOT DETECTED Final   Candida parapsilosis NOT DETECTED NOT DETECTED Final   Candida tropicalis NOT DETECTED NOT DETECTED Final   Cryptococcus  neoformans/gattii NOT DETECTED NOT DETECTED Final    Comment: Performed at [facility], [address] [place], [place] [address]  Culture, blood (Routine x 2)     Status: None (Preliminary result)   Collection Time: 02/20/21 12:33 PM   Specimen: BLOOD  Result Value Ref Range Status   Specimen Description BLOOD LEFT ANTECUBITAL  Final   Special Requests   Final    BOTTLES DRAWN AEROBIC AND ANAEROBIC Blood Culture adequate volume   Culture   Final    NO GROWTH 4 DAYS Performed at [facility], [address] [place], [place] [address]    Report Status PENDING  Incomplete  Resp Panel by RT-PCR (Flu A&B, Covid) Nasopharyngeal Swab     Status: Abnormal   Collection Time: 02/20/21  1:05 PM   Specimen: Nasopharyngeal Swab; Nasopharyngeal(NP) swabs in vial transport medium  Result Value Ref Range Status   SARS Coronavirus 2 by RT PCR POSITIVE (A) NEGATIVE Final    Comment: RESULT CALLED TO, READ BACK BY AND VERIFIED WITH: LEXI OLIVER 02/20/21 1424 KLW (NOTE) SARS-CoV-2 target nucleic acids are DETECTED.  The SARS-CoV-2 RNA is generally detectable in upper respiratory specimens during the acute phase of infection. Positive results are indicative of the presence of the identified virus, but do not rule out bacterial infection or co-infection with other pathogens not detected by the test. Clinical correlation with patient history and other diagnostic information  is necessary to determine patient infection status. The expected result is Negative.  Fact Sheet for Patients: BloggerCourse.com  Fact Sheet for Healthcare Providers: SeriousBroker.it  This test is not yet approved or cleared by the [place] FDA and  has been authorized for detection and/or diagnosis of SARS-CoV-2 by FDA under an Emergency Use Authorization (EUA).  This EUA will remain in effect (meaning this test can be used ) for the duration of  the  COVID-19 declaration under Section 564(b)(1) of the Act, 21 U.S.C. section 360bbb-3(b)(1), unless the authorization is terminated or revoked sooner.     Influenza A by PCR NEGATIVE NEGATIVE Final   Influenza B by PCR NEGATIVE NEGATIVE Final    Comment: (NOTE) The Xpert Xpress SARS-CoV-2/FLU/RSV plus assay is intended as an aid in the diagnosis of influenza from Nasopharyngeal swab specimens and should not be used as a sole basis for treatment. Nasal washings and aspirates are unacceptable for Xpert Xpress SARS-CoV-2/FLU/RSV testing.  Fact Sheet for Patients: BloggerCourse.com  Fact Sheet for Healthcare Providers: SeriousBroker.it  This test is not yet approved or cleared by the [place] FDA and has been authorized for detection and/or diagnosis of SARS-CoV-2 by FDA under an Emergency Use Authorization (EUA). This EUA will remain in effect (meaning this test can be used) for the duration of the COVID-19 declaration under Section 564(b)(1) of the Act, 21 U.S.C. section 360bbb-3(b)(1), unless the authorization is terminated or revoked.  Performed at [facility], [address] [place], [place] [address]   Urine Culture     Status: None   Collection Time: 02/20/21  2:32 PM   Specimen: Urine, Random  Result Value Ref Range Status   Specimen Description   Final    URINE, RANDOM Performed at [facility], [address], [place], [place] [address]    Special Requests   Final    NONE Performed at [facility], [address], [place], [place] [address]    Culture   Final    NO GROWTH Performed at [facility], [address], [place], [place] [address]    Report Status 02/21/2021 FINAL  Final  MRSA Next Gen by PCR, Nasal     Status: None   Collection Time: 02/22/21  1:44 AM   Specimen: Nasal Mucosa; Nasal Swab  Result Value Ref Range Status   MRSA by PCR Next Gen NOT DETECTED NOT  DETECTED Final    Comment: (NOTE) The GeneXpert MRSA Assay (FDA approved for NASAL specimens only), is one component of a comprehensive MRSA colonization surveillance program. It is not intended to diagnose MRSA infection nor to guide or monitor treatment for MRSA infections. Test performance is not FDA approved in patients less than 64 years old. Performed at [facility], [address], [place], [place] [address]          Radiology Studies: No results found.    Scheduled Meds:  Chlorhexidine Gluconate Cloth  6 each Topical Daily   divalproex  125 mg Oral BID   enoxaparin (LOVENOX) injection  40 mg Subcutaneous Q24H   Ensure  237 mL Oral BID BM   ipratropium  2 puff Inhalation Q4H   iron polysaccharides  150 mg Oral Q1200   lactulose  10 g Oral Daily   loratadine  10 mg Oral Daily   methylPREDNISolone (SOLU-MEDROL) injection  40 mg Intravenous Q24H   tretinoin  1 application Topical See admin instructions   Continuous Infusions:   ceFAZolin (  ANCEF) IV 1 g (02/24/21 1445)   lactated ringers 100 mL/hr at 02/24/21 0356     LOS: 4 days      Calvert Cantor, MD [facility] Pager: [email or web address] 02/24/2021, 3:09 PM

## 2021-02-25 DIAGNOSIS — J1282 Pneumonia due to coronavirus disease 2019: Secondary | ICD-10-CM | POA: Diagnosis not present

## 2021-02-25 DIAGNOSIS — U071 COVID-19: Secondary | ICD-10-CM | POA: Diagnosis not present

## 2021-02-25 LAB — CULTURE, BLOOD (ROUTINE X 2)
Culture: NO GROWTH
Special Requests: ADEQUATE

## 2021-02-25 NOTE — Plan of Care (Signed)
  Problem: Clinical Measurements: Goal: Will remain free from infection Outcome: Progressing   Problem: Pain Managment: Goal: General experience of comfort will improve Outcome: Progressing   Problem: Clinical Measurements: Goal: Ability to avoid or minimize complications of infection will improve Outcome: Progressing

## 2021-02-25 NOTE — Progress Notes (Signed)
PROGRESS NOTE    [name]   KVQ:[id number]  DOB: Nov 13, 1960  DOA: 02/20/2021 PCP: [place], MD   Brief Narrative:  [name] is a 60 y.o. 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 In the ED was found to have a temperature of 103.3.  He had erythema on his left lower leg consistent with cellulitis.  Blood pressure was 80s over 50s and improved after giving multiple fluid boluses.  Rested positive for COVID-19.   Subjective: No complaints.     Assessment & Plan:   Principal Problem:   Pneumonia due to COVID-19 virus-severe sepsis -Continue remdesivir and IV steroids- Remdesivir stop date 10/2 -of note, 1 set of blood cultures has grown out contaminants (staph capitis and viridans strep)  Active Problems: Nausea vomiting and diarrhea - Likely related to acute COVID infection and has improved. Eating and drinking w/o difficulty  ?  Atrial thrombus noted on CT - 2D echo does not reveal a thrombus-  - no DVT on vascular duplex - cardiology input requested- cardiology recommended to stop Heparin as finding on CT was likely an artifact  Cellulitis of left leg - given Vancomycin, Flagyl and Ceftriaxone- later changed to Cefazolin - Leg remains erythematous and swollen- it seems that it is being kept in a dependent position and this is due to venous stasis- there are blisters on the leg today- I have requested an Unna boot to be placed  - Keflex discontinued on 9/30  Dehydration  - will stop IVF today and encourage oral liquids  Iron deficiency anemia - Continue iron capsule daily    Cerebral palsy, quadriplegic  (HCC) -Needs total care  Pressure injuries - Stage III pressure injury on sacrum - Stage II pressure injury on left medial knee  Obsessive compulsive disorder   Time spent in minutes: 35 DVT prophylaxis: enoxaparin (LOVENOX) injection 40 mg Start: 02/23/21 1330  Lovenox Code Status: Full code Family Communication:  Level of Care: Level of care: Progressive Cardiac Disposition Plan:  Status is: Inpatient  Remains inpatient appropriate because:IV treatments appropriate due to intensity of illness or inability to take PO  Dispo: The patient is from: [facility]              Anticipated d/c is to: [facility]              Patient currently is not medically stable to d/c.   Difficult to place patient No      Consultants:  None Procedures:  None Antimicrobials:  Anti-infectives (From admission, onward)    Start     Dose/Rate Route Frequency Ordered Stop   02/23/21 0600  ceFAZolin (ANCEF) IVPB 1 g/50 mL premix        1 g 100 mL/hr over 30 Minutes Intravenous Every 8 hours 02/22/21 1458     02/22/21 1415  ceFAZolin (ANCEF) IVPB 1 g/50 mL premix  Status:  Discontinued        1 g 100 mL/hr over 30 Minutes Intravenous Every 8 hours 02/22/21 1322 02/22/21 1458   02/21/21 1000  remdesivir 100 mg in sodium chloride 0.9 % 100 mL IVPB       See Hyperspace for full Linked  Orders Report.   100 mg 200 mL/hr over 30 Minutes Intravenous Daily 02/20/21 1526 02/24/21 0936   02/21/21 0600  cefTRIAXone (ROCEPHIN) 2 g in sodium chloride 0.9 % 100 mL IVPB  Status:  Discontinued        2 g 200 mL/hr over 30 Minutes Intravenous Every 24 hours 02/21/21 0119 02/22/21 1322   02/21/21 0400  vancomycin (VANCOREADY) IVPB 750 mg/150 mL  Status:  Discontinued        750 mg 150 mL/hr over 60 Minutes Intravenous Every 12 hours 02/20/21 1721 02/21/21 0118   02/20/21 1730  ceFEPIme (MAXIPIME) 2 g in sodium chloride 0.9 % 100 mL IVPB  Status:  Discontinued        2 g 200 mL/hr over 30 Minutes  Intravenous Every 8 hours 02/20/21 1721 02/21/21 0118   02/20/21 1530  remdesivir 200 mg in sodium chloride 0.9% 250 mL IVPB       See Hyperspace for full Linked Orders Report.   200 mg 580 mL/hr over 30 Minutes Intravenous Once 02/20/21 1526 02/20/21 1737   02/20/21 1330  ceFEPIme (MAXIPIME) 2 g in sodium chloride 0.9 % 100 mL IVPB        2 g 200 mL/hr over 30 Minutes Intravenous  Once 02/20/21 1323 02/20/21 1426   02/20/21 1330  metroNIDAZOLE (FLAGYL) IVPB 500 mg        500 mg 100 mL/hr over 60 Minutes Intravenous  Once 02/20/21 1323 02/20/21 1540   02/20/21 1330  vancomycin (VANCOCIN) IVPB 1000 mg/200 mL premix        1,000 mg 200 mL/hr over 60 Minutes Intravenous  Once 02/20/21 1323 02/20/21 1700        Objective: Vitals:   02/24/21 2315 02/25/21 0344 02/25/21 0900 02/25/21 1244  BP: (!) 143/99 (!) 144/99 (!) 118/94 132/78  Pulse:    78  Resp: 18 18  18   Temp: 98.7 F (37.1 C) 98.7 F (37.1 C) 98.1 F (36.7 C) 99 F (37.2 C)  TempSrc: Oral Axillary Axillary Axillary  SpO2: 98% 98% 100% 100%  Weight:        Intake/Output Summary (Last 24 hours) at 02/25/2021 1343 Last data filed at 02/25/2021 1244 Gross per 24 hour  Intake 1555.45 ml  Output 1800 ml  Net -244.55 ml    Filed Weights   02/20/21 1622 02/22/21 0324  Weight: 65 kg 60 kg    Examination: General exam: Appears comfortable  HEENT: PERRLA, oral mucosa moist, no sclera icterus or thrush Respiratory system: Clear to auscultation. Respiratory effort normal. Cardiovascular system: S1 & S2 heard, regular rate and rhythm Gastrointestinal system: Abdomen soft, non-tender, nondistended. Normal bowel sounds   Central nervous system: Alert and oriented. Not able to move LE, can move arms- hand are contracted  Extremities: No cyanosis, clubbing or edema Skin: No rashes or ulcers Psychiatry:  Mood & affect appropriate.       Data Reviewed: I have personally reviewed following labs and imaging  studies  CBC: Recent Labs  Lab 02/20/21 1215 02/21/21 0654 02/23/21 0542  WBC 17.6* 12.4* 9.9  NEUTROABS 16.1* 10.9*  --   HGB 13.3 11.3* 10.9*  HCT 39.8 33.9* 33.1*  MCV 74.7* 75.0* 73.6*  PLT 257 234 330    Basic Metabolic Panel: Recent Labs  Lab 02/20/21 1215 02/20/21 1432 02/21/21 0654 02/22/21 0517  NA 138  --  138 136  K 3.7  --  4.1 4.2  CL 101  --  104 104  CO2 25  --  24 23  GLUCOSE 132*  --  128* 131*  BUN 26*  --  22* 20  CREATININE 0.73  --  0.50* 0.43*  CALCIUM 9.5  --  9.0 8.6*  MG  --  1.8  --   --     GFR: Estimated Creatinine Clearance: 83.3 mL/min (A) (by C-G formula based on SCr of 0.43 mg/dL (L)). Liver Function Tests: Recent Labs  Lab 02/20/21 1215 02/21/21 0654 02/22/21 0517  AST 79* 37 32  ALT 131* 77* 57*  ALKPHOS 169* 118 98  BILITOT 0.7 0.6 0.6  PROT 7.9 6.4* 5.8*  ALBUMIN 3.1* 2.4* 2.1*    Recent Labs  Lab 02/20/21 1215  LIPASE 22    No results for input(s): AMMONIA in the last 168 hours. Coagulation Profile: Recent Labs  Lab 02/20/21 1215  INR 1.1    Cardiac Enzymes: No results for input(s): CKTOTAL, CKMB, CKMBINDEX, TROPONINI in the last 168 hours. BNP (last 3 results) No results for input(s): PROBNP in the last 8760 hours. HbA1C: No results for input(s): HGBA1C in the last 72 hours. CBG: Recent Labs  Lab 02/22/21 0937 02/24/21 1011  GLUCAP 110* 88    Lipid Profile: No results for input(s): CHOL, HDL, LDLCALC, TRIG, CHOLHDL, LDLDIRECT in the last 72 hours. Thyroid Function Tests: No results for input(s): TSH, T4TOTAL, FREET4, T3FREE, THYROIDAB in the last 72 hours. Anemia Panel: No results for input(s): VITAMINB12, FOLATE, FERRITIN, TIBC, IRON, RETICCTPCT in the last 72 hours.  Urine analysis:    Component Value Date/Time   COLORURINE YELLOW (A) 02/20/2021 1432   APPEARANCEUR CLEAR (A) 02/20/2021 1432   LABSPEC >1.046 (H) 02/20/2021 1432   PHURINE 6.0 02/20/2021 1432   GLUCOSEU NEGATIVE 02/20/2021  1432   HGBUR NEGATIVE 02/20/2021 1432   HGBUR negative 06/14/2010 1119   BILIRUBINUR NEGATIVE 02/20/2021 1432   BILIRUBINUR SMALL 01/04/2016 1634   KETONESUR NEGATIVE 02/20/2021 1432   PROTEINUR 100 (A) 02/20/2021 1432   UROBILINOGEN 2.0 (H) 02/05/2018 1825   NITRITE NEGATIVE 02/20/2021 1432   LEUKOCYTESUR NEGATIVE 02/20/2021 1432   Sepsis Labs: @LABRCNTIP (procalcitonin:4,lacticidven:4) ) Recent Results (from the past 240 hour(s))  Culture, blood (Routine x 2)     Status: Abnormal   Collection Time: 02/20/21 12:30 PM   Specimen: BLOOD  Result Value Ref Range Status   Specimen Description   Final    BLOOD LEFT ANTECUBITAL Performed at [facility], [address] [place], [place]    Special Requests   Final    BOTTLES DRAWN AEROBIC AND ANAEROBIC Blood Culture adequate volume Performed at [facility], [address] [place], [place]    Culture  Setup Time   Final    GRAM POSITIVE COCCI IN BOTH AEROBIC AND ANAEROBIC BOTTLES CRITICAL RESULT CALLED TO, READ BACK BY AND VERIFIED WITH: JASON ROBBINS PHARMD [id number] 02/21/21 HNM    Culture (A)  Final    STAPHYLOCOCCUS CAPITIS VIRIDANS STREPTOCOCCUS THE SIGNIFICANCE OF ISOLATING THIS ORGANISM FROM A SINGLE SET OF BLOOD CULTURES WHEN MULTIPLE SETS ARE DRAWN IS UNCERTAIN. PLEASE NOTIFY THE MICROBIOLOGY DEPARTMENT WITHIN ONE WEEK IF SPECIATION AND SENSITIVITIES ARE REQUIRED. Performed at [facility], [address] [place], [place]    Report Status 02/23/2021 FINAL  Final  Blood Culture ID Panel (Reflexed)     Status: Abnormal   Collection Time: 02/20/21 12:30 PM  Result Value Ref Range Status   Enterococcus faecalis NOT DETECTED NOT DETECTED Final  Enterococcus Faecium NOT DETECTED NOT DETECTED Final   Listeria monocytogenes NOT DETECTED NOT DETECTED Final   Staphylococcus species NOT DETECTED NOT DETECTED Final   Staphylococcus aureus (BCID) NOT DETECTED NOT DETECTED Final    Staphylococcus epidermidis NOT DETECTED NOT DETECTED Final   Staphylococcus lugdunensis NOT DETECTED NOT DETECTED Final   Streptococcus species DETECTED (A) NOT DETECTED Final    Comment: Not Enterococcus species, Streptococcus agalactiae, Streptococcus pyogenes, or Streptococcus pneumoniae. CRITICAL RESULT CALLED TO, READ BACK BY AND VERIFIED WITH: JASON ROBBINS PHARMD 0106 02/21/21 HNM    Streptococcus agalactiae NOT DETECTED NOT DETECTED Final   Streptococcus pneumoniae NOT DETECTED NOT DETECTED Final   Streptococcus pyogenes NOT DETECTED NOT DETECTED Final   A.calcoaceticus-baumannii NOT DETECTED NOT DETECTED Final   Bacteroides fragilis NOT DETECTED NOT DETECTED Final   Enterobacterales NOT DETECTED NOT DETECTED Final   Enterobacter cloacae complex NOT DETECTED NOT DETECTED Final   Escherichia coli NOT DETECTED NOT DETECTED Final   Klebsiella aerogenes NOT DETECTED NOT DETECTED Final   Klebsiella oxytoca NOT DETECTED NOT DETECTED Final   Klebsiella pneumoniae NOT DETECTED NOT DETECTED Final   Proteus species NOT DETECTED NOT DETECTED Final   Salmonella species NOT DETECTED NOT DETECTED Final   Serratia marcescens NOT DETECTED NOT DETECTED Final   Haemophilus influenzae NOT DETECTED NOT DETECTED Final   Neisseria meningitidis NOT DETECTED NOT DETECTED Final   Pseudomonas aeruginosa NOT DETECTED NOT DETECTED Final   Stenotrophomonas maltophilia NOT DETECTED NOT DETECTED Final   Candida albicans NOT DETECTED NOT DETECTED Final   Candida auris NOT DETECTED NOT DETECTED Final   Candida glabrata NOT DETECTED NOT DETECTED Final   Candida krusei NOT DETECTED NOT DETECTED Final   Candida parapsilosis NOT DETECTED NOT DETECTED Final   Candida tropicalis NOT DETECTED NOT DETECTED Final   Cryptococcus neoformans/gattii NOT DETECTED NOT DETECTED Final    Comment: Performed at [facility], [address] [place], [place] [address]  Culture, blood (Routine x 2)     Status: None    Collection Time: 02/20/21 12:33 PM   Specimen: BLOOD  Result Value Ref Range Status   Specimen Description BLOOD LEFT ANTECUBITAL  Final   Special Requests   Final    BOTTLES DRAWN AEROBIC AND ANAEROBIC Blood Culture adequate volume   Culture   Final    NO GROWTH 5 DAYS Performed at [facility], [address] [place], [place] [address]    Report Status 02/25/2021 FINAL  Final  Resp Panel by RT-PCR (Flu A&B, Covid) Nasopharyngeal Swab     Status: Abnormal   Collection Time: 02/20/21  1:05 PM   Specimen: Nasopharyngeal Swab; Nasopharyngeal(NP) swabs in vial transport medium  Result Value Ref Range Status   SARS Coronavirus 2 by RT PCR POSITIVE (A) NEGATIVE Final    Comment: RESULT CALLED TO, READ BACK BY AND VERIFIED WITH: LEXI OLIVER 02/20/21 1424 KLW (NOTE) SARS-CoV-2 target nucleic acids are DETECTED.  The SARS-CoV-2 RNA is generally detectable in upper respiratory specimens during the acute phase of infection. Positive results are indicative of the presence of the identified virus, but do not rule out bacterial infection or co-infection with other pathogens not detected by the test. Clinical correlation with patient history and other diagnostic information is necessary to determine patient infection status. The expected result is Negative.  Fact Sheet for Patients: BloggerCourse.com  Fact Sheet for Healthcare Providers: SeriousBroker.it  This test is not yet approved or cleared by the [place] FDA and  has  been authorized for detection and/or diagnosis of SARS-CoV-2 by FDA under an Emergency Use Authorization (EUA).  This EUA will remain in effect (meaning this test can be used ) for the duration of  the COVID-19 declaration under Section 564(b)(1) of the Act, 21 U.S.C. section 360bbb-3(b)(1), unless the authorization is terminated or revoked sooner.     Influenza A by PCR NEGATIVE NEGATIVE Final    Influenza B by PCR NEGATIVE NEGATIVE Final    Comment: (NOTE) The Xpert Xpress SARS-CoV-2/FLU/RSV plus assay is intended as an aid in the diagnosis of influenza from Nasopharyngeal swab specimens and should not be used as a sole basis for treatment. Nasal washings and aspirates are unacceptable for Xpert Xpress SARS-CoV-2/FLU/RSV testing.  Fact Sheet for Patients: BloggerCourse.com  Fact Sheet for Healthcare Providers: SeriousBroker.it  This test is not yet approved or cleared by the [place] FDA and has been authorized for detection and/or diagnosis of SARS-CoV-2 by FDA under an Emergency Use Authorization (EUA). This EUA will remain in effect (meaning this test can be used) for the duration of the COVID-19 declaration under Section 564(b)(1) of the Act, 21 U.S.C. section 360bbb-3(b)(1), unless the authorization is terminated or revoked.  Performed at [facility], [address] [place], [place] [address]   Urine Culture     Status: None   Collection Time: 02/20/21  2:32 PM   Specimen: Urine, Random  Result Value Ref Range Status   Specimen Description   Final    URINE, RANDOM Performed at [facility], [address] [place], [place] [address]    Special Requests   Final    NONE Performed at [facility], [address], [place], [place] [address]    Culture   Final    NO GROWTH Performed at [facility], [address], [place], [place] [address]    Report Status 02/21/2021 FINAL  Final  MRSA Next Gen by PCR, Nasal     Status: None   Collection Time: 02/22/21  1:44 AM   Specimen: Nasal Mucosa; Nasal Swab  Result Value Ref Range Status   MRSA by PCR Next Gen NOT DETECTED NOT DETECTED Final    Comment: (NOTE) The GeneXpert MRSA Assay (FDA approved for NASAL specimens only), is one component of a comprehensive MRSA colonization surveillance program. It is not intended to  diagnose MRSA infection nor to guide or monitor treatment for MRSA infections. Test performance is not FDA approved in patients less than 31 years old. Performed at [facility], [address] [place], [place] [address]          Radiology Studies: No results found.    Scheduled Meds:  Chlorhexidine Gluconate Cloth  6 each Topical Daily   divalproex  125 mg Oral BID   enoxaparin (LOVENOX) injection  40 mg Subcutaneous Q24H   Ensure  237 mL Oral BID BM   ipratropium  2 puff Inhalation Q4H   iron polysaccharides  150 mg Oral Q1200   lactulose  10 g Oral Daily   loratadine  10 mg Oral Daily   methylPREDNISolone (SOLU-MEDROL) injection  40 mg Intravenous Q24H   tretinoin  1 application Topical See admin instructions   Continuous Infusions:   ceFAZolin (ANCEF) IV 1 g (02/25/21 0659)   lactated ringers 100 mL/hr at 02/25/21 0657     LOS: 5 days      Calvert Cantor, MD [facility] Pager: [email or web address] 02/25/2021, 1:43 PM

## 2021-02-26 DIAGNOSIS — J1282 Pneumonia due to coronavirus disease 2019: Secondary | ICD-10-CM | POA: Diagnosis not present

## 2021-02-26 DIAGNOSIS — U071 COVID-19: Secondary | ICD-10-CM | POA: Diagnosis not present

## 2021-02-26 LAB — GLUCOSE, CAPILLARY: Glucose-Capillary: 124 mg/dL — ABNORMAL HIGH (ref 70–99)

## 2021-02-26 MED ORDER — ENSURE ENLIVE PO LIQD
237.0000 mL | Freq: Three times a day (TID) | ORAL | Status: DC
  Administered 2021-02-26 – 2021-03-03 (×14): 237 mL via ORAL

## 2021-02-26 MED ORDER — DEXAMETHASONE 6 MG PO TABS
6.0000 mg | ORAL_TABLET | Freq: Every day | ORAL | Status: AC
  Administered 2021-02-26 – 2021-02-28 (×3): 6 mg via ORAL
  Filled 2021-02-26 (×3): qty 1

## 2021-02-26 MED ORDER — ADULT MULTIVITAMIN W/MINERALS CH
1.0000 | ORAL_TABLET | Freq: Every day | ORAL | Status: DC
  Administered 2021-02-27 – 2021-03-03 (×5): 1 via ORAL
  Filled 2021-02-26 (×5): qty 1

## 2021-02-26 NOTE — Progress Notes (Signed)
PROGRESS NOTE    [name]   HEN:[id number]  DOB: [birth year]  DOA: 02/20/2021 PCP: [place], MD   Brief Narrative:  [name] is a 60 y.o. 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 In the ED was found to have a temperature of 103.3.  He had erythema on his left lower leg consistent with cellulitis.  Blood pressure was 80s over 50s and improved after giving multiple fluid boluses.  Rested positive for COVID-19.   Subjective: No complaints.     Assessment & Plan:   Principal Problem:   Pneumonia due to COVID-19 virus-severe sepsis -he has completed remdesivir as of 10/2- will transition to oral Decadron to complete 10 days of steroids -of note, 1 set of blood cultures has grown out contaminants (staph capitis and viridans strep)  Active Problems: Nausea vomiting and diarrhea - Likely related to acute COVID infection and has improved. Eating and drinking w/o difficulty  ?  Atrial thrombus noted on CT - 2D echo does not reveal a thrombus-  - no DVT on vascular duplex - cardiology input requested- cardiology recommended to stop Heparin as finding on CT was likely an artifact  Cellulitis of left leg - given Vancomycin, Flagyl and Ceftriaxone- later changed to Cefazolin- - stop today - Leg remains erythematous and swollen- it seems that it is being kept in a dependent position and this is due to venous stasis- there are blisters on the leg today- I have requested an Unna boot to be placed     Dehydration  - have stopped IVF - continue to encourage oral liquids  Poor oral intake - eating < 25 % of meals- start calorie  count today  Iron deficiency anemia - Continue iron capsule daily    Cerebral palsy, quadriplegic (HCC) Right BKA - contracted extremities -Needs total care  Pressure injuries - Stage III pressure injury on sacrum - Stage II pressure injury on left medial knee - air mattress ordered  Obsessive compulsive disorder   Time spent in minutes: 35 DVT prophylaxis: enoxaparin (LOVENOX) injection 40 mg Start: 02/23/21 1330  Lovenox Code Status: Full code Family Communication:  Level of Care: Level of care: Progressive Cardiac Disposition Plan:  Status is: Inpatient  Remains inpatient appropriate because:IV treatments appropriate due to intensity of illness or inability to take PO  Dispo: The patient is from: [facility]              Anticipated d/c is to: [facility]              Patient currently is not medically stable to d/c.   Difficult to place patient No      Consultants:  None Procedures:  None Antimicrobials:  Anti-infectives (From admission, onward)    Start     Dose/Rate Route Frequency Ordered Stop   02/23/21 0600  ceFAZolin (ANCEF) IVPB 1 g/50 mL premix  Status:  Discontinued        1 g 100 mL/hr over 30 Minutes Intravenous Every 8 hours 02/22/21 1458 02/26/21 1420   02/22/21 1415  ceFAZolin (ANCEF) IVPB 1 g/50 mL premix  Status:  Discontinued        1 g 100 mL/hr  over 30 Minutes Intravenous Every 8 hours 02/22/21 1322 02/22/21 1458   02/21/21 1000  remdesivir 100 mg in sodium chloride 0.9 % 100 mL IVPB       See Hyperspace for full Linked Orders Report.   100 mg 200 mL/hr over 30 Minutes Intravenous Daily 02/20/21 1526 02/24/21 0936   02/21/21 0600  cefTRIAXone (ROCEPHIN) 2 g in sodium chloride 0.9 % 100 mL IVPB  Status:  Discontinued        2 g 200 mL/hr over 30 Minutes Intravenous Every 24 hours 02/21/21 0119 02/22/21 1322   02/21/21 0400  vancomycin (VANCOREADY) IVPB 750 mg/150 mL  Status:  Discontinued        750 mg 150 mL/hr over 60 Minutes  Intravenous Every 12 hours 02/20/21 1721 02/21/21 0118   02/20/21 1730  ceFEPIme (MAXIPIME) 2 g in sodium chloride 0.9 % 100 mL IVPB  Status:  Discontinued        2 g 200 mL/hr over 30 Minutes Intravenous Every 8 hours 02/20/21 1721 02/21/21 0118   02/20/21 1530  remdesivir 200 mg in sodium chloride 0.9% 250 mL IVPB       See Hyperspace for full Linked Orders Report.   200 mg 580 mL/hr over 30 Minutes Intravenous Once 02/20/21 1526 02/20/21 1737   02/20/21 1330  ceFEPIme (MAXIPIME) 2 g in sodium chloride 0.9 % 100 mL IVPB        2 g 200 mL/hr over 30 Minutes Intravenous  Once 02/20/21 1323 02/20/21 1426   02/20/21 1330  metroNIDAZOLE (FLAGYL) IVPB 500 mg        500 mg 100 mL/hr over 60 Minutes Intravenous  Once 02/20/21 1323 02/20/21 1540   02/20/21 1330  vancomycin (VANCOCIN) IVPB 1000 mg/200 mL premix        1,000 mg 200 mL/hr over 60 Minutes Intravenous  Once 02/20/21 1323 02/20/21 1700        Objective: Vitals:   02/25/21 2129 02/25/21 2357 02/26/21 0925 02/26/21 1211  BP: (!) 181/86 (!) 142/83 118/71 100/63  Pulse: 79 76 63 66  Resp: 16 18 18 18   Temp: 98.7 F (37.1 C) 98.6 F (37 C) 98.5 F (36.9 C) 98.6 F (37 C)  TempSrc: Axillary Axillary Axillary Axillary  SpO2: 98% 99% 97% 99%  Weight:        Intake/Output Summary (Last 24 hours) at 02/26/2021 1521 Last data filed at 02/26/2021 1230 Gross per 24 hour  Intake 1326.66 ml  Output 2800 ml  Net -1473.34 ml    Filed Weights   02/20/21 1622 02/22/21 0324  Weight: 65 kg 60 kg    Examination: General exam: Appears comfortable  HEENT: PERRLA, oral mucosa moist, no sclera icterus or thrush Respiratory system: Clear to auscultation. Respiratory effort normal. Cardiovascular system: S1 & S2 heard, regular rate and rhythm Gastrointestinal system: Abdomen soft, non-tender, nondistended. Normal bowel sounds   Central nervous system: Alert and oriented. Contracted extremities, can move arms but not legs Extremities:  No cyanosis, clubbing or edema Skin: No rashes or ulcers Psychiatry:  Mood & affect appropriate.      Data Reviewed: I have personally reviewed following labs and imaging studies  CBC: Recent Labs  Lab 02/20/21 1215 02/21/21 0654 02/23/21 0542  WBC 17.6* 12.4* 9.9  NEUTROABS 16.1* 10.9*  --   HGB 13.3 11.3* 10.9*  HCT 39.8 33.9* 33.1*  MCV 74.7* 75.0* 73.6*  PLT 257 234 330    Basic Metabolic Panel: Recent Labs  Lab 02/20/21  1215 02/20/21 1432 02/21/21 0654 02/22/21 0517  NA 138  --  138 136  K 3.7  --  4.1 4.2  CL 101  --  104 104  CO2 25  --  24 23  GLUCOSE 132*  --  128* 131*  BUN 26*  --  22* 20  CREATININE 0.73  --  0.50* 0.43*  CALCIUM 9.5  --  9.0 8.6*  MG  --  1.8  --   --     GFR: Estimated Creatinine Clearance: 83.3 mL/min (A) (by C-G formula based on SCr of 0.43 mg/dL (L)). Liver Function Tests: Recent Labs  Lab 02/20/21 1215 02/21/21 0654 02/22/21 0517  AST 79* 37 32  ALT 131* 77* 57*  ALKPHOS 169* 118 98  BILITOT 0.7 0.6 0.6  PROT 7.9 6.4* 5.8*  ALBUMIN 3.1* 2.4* 2.1*    Recent Labs  Lab 02/20/21 1215  LIPASE 22    No results for input(s): AMMONIA in the last 168 hours. Coagulation Profile: Recent Labs  Lab 02/20/21 1215  INR 1.1    Cardiac Enzymes: No results for input(s): CKTOTAL, CKMB, CKMBINDEX, TROPONINI in the last 168 hours. BNP (last 3 results) No results for input(s): PROBNP in the last 8760 hours. HbA1C: No results for input(s): HGBA1C in the last 72 hours. CBG: Recent Labs  Lab 02/22/21 0937 02/24/21 1011  GLUCAP 110* 88    Lipid Profile: No results for input(s): CHOL, HDL, LDLCALC, TRIG, CHOLHDL, LDLDIRECT in the last 72 hours. Thyroid Function Tests: No results for input(s): TSH, T4TOTAL, FREET4, T3FREE, THYROIDAB in the last 72 hours. Anemia Panel: No results for input(s): VITAMINB12, FOLATE, FERRITIN, TIBC, IRON, RETICCTPCT in the last 72 hours.  Urine analysis:    Component Value Date/Time    COLORURINE YELLOW (A) 02/20/2021 1432   APPEARANCEUR CLEAR (A) 02/20/2021 1432   LABSPEC >1.046 (H) 02/20/2021 1432   PHURINE 6.0 02/20/2021 1432   GLUCOSEU NEGATIVE 02/20/2021 1432   HGBUR NEGATIVE 02/20/2021 1432   HGBUR negative 06/14/2010 1119   BILIRUBINUR NEGATIVE 02/20/2021 1432   BILIRUBINUR SMALL 01/04/2016 1634   KETONESUR NEGATIVE 02/20/2021 1432   PROTEINUR 100 (A) 02/20/2021 1432   UROBILINOGEN 2.0 (H) 02/05/2018 1825   NITRITE NEGATIVE 02/20/2021 1432   LEUKOCYTESUR NEGATIVE 02/20/2021 1432   Sepsis Labs: @LABRCNTIP (procalcitonin:4,lacticidven:4) ) Recent Results (from the past 240 hour(s))  Culture, blood (Routine x 2)     Status: Abnormal   Collection Time: 02/20/21 12:30 PM   Specimen: BLOOD  Result Value Ref Range Status   Specimen Description   Final    BLOOD LEFT ANTECUBITAL Performed at [facility], [address], [place], [place]    Special Requests   Final    BOTTLES DRAWN AEROBIC AND ANAEROBIC Blood Culture adequate volume Performed at [facility], [address] [place], [place]    Culture  Setup Time   Final    GRAM POSITIVE COCCI IN BOTH AEROBIC AND ANAEROBIC BOTTLES CRITICAL RESULT CALLED TO, READ BACK BY AND VERIFIED WITH: JASON ROBBINS PHARMD 0106 02/21/21 HNM    Culture (A)  Final    STAPHYLOCOCCUS CAPITIS VIRIDANS STREPTOCOCCUS THE SIGNIFICANCE OF ISOLATING THIS ORGANISM FROM A SINGLE SET OF BLOOD CULTURES WHEN MULTIPLE SETS ARE DRAWN IS UNCERTAIN. PLEASE NOTIFY THE MICROBIOLOGY DEPARTMENT WITHIN ONE WEEK IF SPECIATION AND SENSITIVITIES ARE REQUIRED. Performed at [facility], [address], [place], [place]    Report Status 02/23/2021 FINAL  Final  Blood  Culture ID Panel (Reflexed)     Status: Abnormal   Collection Time: 02/20/21 12:30 PM  Result Value Ref Range Status   Enterococcus faecalis NOT DETECTED NOT DETECTED Final   Enterococcus Faecium NOT DETECTED NOT DETECTED Final    Listeria monocytogenes NOT DETECTED NOT DETECTED Final   Staphylococcus species NOT DETECTED NOT DETECTED Final   Staphylococcus aureus (BCID) NOT DETECTED NOT DETECTED Final   Staphylococcus epidermidis NOT DETECTED NOT DETECTED Final   Staphylococcus lugdunensis NOT DETECTED NOT DETECTED Final   Streptococcus species DETECTED (A) NOT DETECTED Final    Comment: Not Enterococcus species, Streptococcus agalactiae, Streptococcus pyogenes, or Streptococcus pneumoniae. CRITICAL RESULT CALLED TO, READ BACK BY AND VERIFIED WITH: JASON ROBBINS PHARMD 0106 02/21/21 HNM    Streptococcus agalactiae NOT DETECTED NOT DETECTED Final   Streptococcus pneumoniae NOT DETECTED NOT DETECTED Final   Streptococcus pyogenes NOT DETECTED NOT DETECTED Final   A.calcoaceticus-baumannii NOT DETECTED NOT DETECTED Final   Bacteroides fragilis NOT DETECTED NOT DETECTED Final   Enterobacterales NOT DETECTED NOT DETECTED Final   Enterobacter cloacae complex NOT DETECTED NOT DETECTED Final   Escherichia coli NOT DETECTED NOT DETECTED Final   Klebsiella aerogenes NOT DETECTED NOT DETECTED Final   Klebsiella oxytoca NOT DETECTED NOT DETECTED Final   Klebsiella pneumoniae NOT DETECTED NOT DETECTED Final   Proteus species NOT DETECTED NOT DETECTED Final   Salmonella species NOT DETECTED NOT DETECTED Final   Serratia marcescens NOT DETECTED NOT DETECTED Final   Haemophilus influenzae NOT DETECTED NOT DETECTED Final   Neisseria meningitidis NOT DETECTED NOT DETECTED Final   Pseudomonas aeruginosa NOT DETECTED NOT DETECTED Final   Stenotrophomonas maltophilia NOT DETECTED NOT DETECTED Final   Candida albicans NOT DETECTED NOT DETECTED Final   Candida auris NOT DETECTED NOT DETECTED Final   Candida glabrata NOT DETECTED NOT DETECTED Final   Candida krusei NOT DETECTED NOT DETECTED Final   Candida parapsilosis NOT DETECTED NOT DETECTED Final   Candida tropicalis NOT DETECTED NOT DETECTED Final   Cryptococcus  neoformans/gattii NOT DETECTED NOT DETECTED Final    Comment: Performed at [facility], [address] [place], [place] [address]  Culture, blood (Routine x 2)     Status: None   Collection Time: 02/20/21 12:33 PM   Specimen: BLOOD  Result Value Ref Range Status   Specimen Description BLOOD LEFT ANTECUBITAL  Final   Special Requests   Final    BOTTLES DRAWN AEROBIC AND ANAEROBIC Blood Culture adequate volume   Culture   Final    NO GROWTH 5 DAYS Performed at [facility], [address] [place], [place] [address]    Report Status 02/25/2021 FINAL  Final  Resp Panel by RT-PCR (Flu A&B, Covid) Nasopharyngeal Swab     Status: Abnormal   Collection Time: 02/20/21  1:05 PM   Specimen: Nasopharyngeal Swab; Nasopharyngeal(NP) swabs in vial transport medium  Result Value Ref Range Status   SARS Coronavirus 2 by RT PCR POSITIVE (A) NEGATIVE Final    Comment: RESULT CALLED TO, READ BACK BY AND VERIFIED WITH: LEXI OLIVER 02/20/21 1424 KLW (NOTE) SARS-CoV-2 target nucleic acids are DETECTED.  The SARS-CoV-2 RNA is generally detectable in upper respiratory specimens during the acute phase of infection. Positive results are indicative of the presence of the identified virus, but do not rule out bacterial infection or co-infection with other pathogens not detected by the test. Clinical correlation with patient history and other diagnostic information is necessary to determine patient infection status. The  expected result is Negative.  Fact Sheet for Patients: BloggerCourse.com  Fact Sheet for Healthcare Providers: SeriousBroker.it  This test is not yet approved or cleared by the [place] FDA and  has been authorized for detection and/or diagnosis of SARS-CoV-2 by FDA under an Emergency Use Authorization (EUA).  This EUA will remain in effect (meaning this test can be used ) for the duration of  the COVID-19  declaration under Section 564(b)(1) of the Act, 21 U.S.C. section 360bbb-3(b)(1), unless the authorization is terminated or revoked sooner.     Influenza A by PCR NEGATIVE NEGATIVE Final   Influenza B by PCR NEGATIVE NEGATIVE Final    Comment: (NOTE) The Xpert Xpress SARS-CoV-2/FLU/RSV plus assay is intended as an aid in the diagnosis of influenza from Nasopharyngeal swab specimens and should not be used as a sole basis for treatment. Nasal washings and aspirates are unacceptable for Xpert Xpress SARS-CoV-2/FLU/RSV testing.  Fact Sheet for Patients: BloggerCourse.com  Fact Sheet for Healthcare Providers: SeriousBroker.it  This test is not yet approved or cleared by the [place] FDA and has been authorized for detection and/or diagnosis of SARS-CoV-2 by FDA under an Emergency Use Authorization (EUA). This EUA will remain in effect (meaning this test can be used) for the duration of the COVID-19 declaration under Section 564(b)(1) of the Act, 21 U.S.C. section 360bbb-3(b)(1), unless the authorization is terminated or revoked.  Performed at [facility], [address], [place], [place] [address]   Urine Culture     Status: None   Collection Time: 02/20/21  2:32 PM   Specimen: Urine, Random  Result Value Ref Range Status   Specimen Description   Final    URINE, RANDOM Performed at [facility], [address] [place], [place] [address]    Special Requests   Final    NONE Performed at [facility], [address] [place], [place] [address]    Culture   Final    NO GROWTH Performed at [facility], [address] [place], [place] [address]    Report Status 02/21/2021 FINAL  Final  MRSA Next Gen by PCR, Nasal     Status: None   Collection Time: 02/22/21  1:44 AM   Specimen: Nasal Mucosa; Nasal Swab  Result Value Ref Range Status   MRSA by PCR Next Gen NOT DETECTED NOT DETECTED  Final    Comment: (NOTE) The GeneXpert MRSA Assay (FDA approved for NASAL specimens only), is one component of a comprehensive MRSA colonization surveillance program. It is not intended to diagnose MRSA infection nor to guide or monitor treatment for MRSA infections. Test performance is not FDA approved in patients less than 55 years old. Performed at [facility], [address] [place], [place] [address]          Radiology Studies: No results found.    Scheduled Meds:  divalproex  125 mg Oral BID   enoxaparin (LOVENOX) injection  40 mg Subcutaneous Q24H   Ensure  237 mL Oral BID BM   ipratropium  2 puff Inhalation Q4H   iron polysaccharides  150 mg Oral Q1200   lactulose  10 g Oral Daily   loratadine  10 mg Oral Daily   Continuous Infusions:     LOS: 6 days      Calvert Cantor, MD [facility] Pager: [email or web address] 02/26/2021, 3:21 PM

## 2021-02-26 NOTE — TOC Progression Note (Signed)
Transition of Care [facility]) - Progression Note    Patient Details  Name: [name] MRN: [id number] Date of Birth: 17-Jun-1960  [facility] of [facility]) CM/SW Contact  Hetty Ely, RN Phone Number: 02/26/2021, 2:37 PM  Clinical Narrative: Attempted to call [facility] [phone] to discuss discharge plan to return to facility after COVID days. No answer LVM to return call.           Expected Discharge Plan and Services                                                 Social Determinants of Health (SDOH) Interventions    Readmission Risk Interventions Readmission Risk Prevention Plan 08/03/2020  Post Dischage Appt Complete  Medication Screening Complete  Some recent data might be hidden

## 2021-02-27 DIAGNOSIS — U071 COVID-19: Secondary | ICD-10-CM | POA: Diagnosis not present

## 2021-02-27 DIAGNOSIS — D509 Iron deficiency anemia, unspecified: Secondary | ICD-10-CM | POA: Diagnosis not present

## 2021-02-27 DIAGNOSIS — G808 Other cerebral palsy: Secondary | ICD-10-CM | POA: Diagnosis not present

## 2021-02-27 DIAGNOSIS — J1282 Pneumonia due to coronavirus disease 2019: Secondary | ICD-10-CM | POA: Diagnosis not present

## 2021-02-27 MED ORDER — ASCORBIC ACID 500 MG PO TABS
500.0000 mg | ORAL_TABLET | Freq: Two times a day (BID) | ORAL | Status: DC
  Administered 2021-02-27 – 2021-02-28 (×2): 500 mg via ORAL
  Filled 2021-02-27 (×2): qty 1

## 2021-02-27 MED FILL — Nutritional Supplement Liquid: ORAL | Qty: 237 | Status: AC

## 2021-02-27 NOTE — Progress Notes (Signed)
48 Hour Calorie Count Day 1  Estimated Nutritional Needs:    Kcal: 2000-2300kcal/day  Protein: 100-115g/day  Fluid:  1.8-2.1L/day   Dinner 10/3: sips/bites, 100% pudding   Total- 170kcal and 3g protein   Breakfast 10/4: 100% banana, bites sausage and egg  Total- 160kcal and 5g protein   Lunch 10/4: 75% broccoli, grilled chicken and mashed potatoes, 100% Ensure   Total- 598kcal/day and 25g protein   Total Intake:  928kcal (46% estimated needs) and 33g protein (33% estimated needs)  Betsey Holiday MS, RD, LDN Please refer to [facility] for RD and/or RD on-call/weekend/after hours pager

## 2021-02-27 NOTE — Progress Notes (Signed)
Initial Nutrition Assessment  DOCUMENTATION CODES:   Severe malnutrition in context of chronic illness  INTERVENTION:   Ensure Enlive po TID, each supplement provides 350 kcal and 20 grams of protein  Magic cup TID with meals, each supplement provides 290 kcal and 9 grams of protein  MVI po daily   Vitamin C 567m po BID   Dysphagia 3 diet   Pt at high refeed risk; recommend monitor potassium, magnesium and phosphorus labs daily until stable  NUTRITION DIAGNOSIS:   Severe Malnutrition related to chronic illness (cerebral palsy) as evidenced by severe muscle depletion, severe fat depletion.  GOAL:   Patient will meet greater than or equal to 90% of their needs  MONITOR:   PO intake, Supplement acceptance, Weight trends, Labs, Skin, I & O's  REASON FOR ASSESSMENT:   Consult Assessment of nutrition requirement/status  ASSESSMENT:   60[place] with quadri[place] except use of left arm, right BKA, chronic sacral wound, hyperlipidemia, anxiety, bipolar disorder, gastric ulcer with bleeding, iron deficiency anemia, obsessive compulsive disorder and DVT not on AC who is admitted with COVID 19 and sepsis  Met with pt in room today. Pt reports poor appetite and oral intake pta but reports that his appetite is improving in hospital. Pt eating anywhere from sips/bites to 25% of most of his meals since admission. Pt reports that he ate almost all of his lunch today. Forty eight hour calorie count in progress. Pt meeting ~46% of his calorie needs and ~33% of his protein needs over the past 24 hours. Pt did eat 75% of his lunch today. Pt drinking about 50% of his Ensure supplements and reports the he enjoys chocolate Ensure and butter pecan Nepro. Pt also reports that he likes the [employer] only the chocolate ones. RD discussed with pt the importance of adequate protein intake needed to preserve  muscles; pt reports that he is willing to drink the supplements and eat his food. RD will add supplements and vitamins to help pt meet his estimated needs and to support wound healing. Pt is likely at refeed risk. Per chart, pt appears to be down ~22lbs(14%) since June but RD unsure if weights are accurate as pt is bed bound at baseline.   Medications reviewed and include: dexamethasone, lovenox, lactulose, MVI, iron polysaccharides   Labs reviewed: K 4.2 wnl, creat 0.43(L) Hgb 10.9(L), Hct 33.1(L), MCV 73.6(L), MCH 24.2(L)  NUTRITION - FOCUSED PHYSICAL EXAM:  Flowsheet Row Most Recent Value  Orbital Region Mild depletion  Upper Arm Region Severe depletion  Thoracic and Lumbar Region Severe depletion  Buccal Region Mild depletion  Temple Region Mild depletion  Clavicle Bone Region Severe depletion  Clavicle and Acromion Bone Region Severe depletion  Scapular Bone Region Severe depletion  Dorsal Hand Severe depletion  Patellar Region Severe depletion  Anterior Thigh Region Severe depletion  Posterior Calf Region Severe depletion  Edema (RD Assessment) Mild  Hair Reviewed  Eyes Reviewed  Mouth Reviewed  Skin Reviewed  Nails Reviewed   Diet Order:   Diet Order             DIET DYS 3 Room service appropriate? Yes; Fluid consistency: Thin  Diet effective now                  EDUCATION NEEDS:   Education needs have been addressed  Skin:  Skin Assessment: Reviewed RN Assessment (Stage 3 Sacral Pressure Injury,  Stage II knee)  Last BM:  10/2  Height:   Ht Readings from Last 1 Encounters:  12/06/20 6' (1.829 m)    Weight:   Wt Readings from Last 1 Encounters:  02/22/21 60 kg    Ideal Body Weight:  68 kg (adjusted for BKA and quadriplegia)  BMI:  Body mass index is 17.94 kg/m.  Estimated Nutritional Needs:   Kcal:  2000-2300kcal/day  Protein:  100-115g/day  Fluid:  1.8-2.1L/day  Koleen Distance MS, RD, LDN Please refer to [facility] for RD and/or RD  on-call/weekend/after hours pager

## 2021-02-28 DIAGNOSIS — E43 Unspecified severe protein-calorie malnutrition: Secondary | ICD-10-CM | POA: Diagnosis present

## 2021-02-28 LAB — BASIC METABOLIC PANEL
Anion gap: 6 (ref 5–15)
BUN: 16 mg/dL (ref 6–20)
CO2: 28 mmol/L (ref 22–32)
Calcium: 8.8 mg/dL — ABNORMAL LOW (ref 8.9–10.3)
Chloride: 104 mmol/L (ref 98–111)
Creatinine, Ser: 0.37 mg/dL — ABNORMAL LOW (ref 0.61–1.24)
GFR, Estimated: >60 mL/min (ref >60)
Glucose, Bld: 85 mg/dL (ref 70–99)
Potassium: 3.9 mmol/L (ref 3.5–5.1)
Sodium: 138 mmol/L (ref 135–145)

## 2021-02-28 LAB — CBC
HCT: 36.9 % — ABNORMAL LOW (ref 39.0–52.0)
Hemoglobin: 11.8 g/dL — ABNORMAL LOW (ref 13.0–17.0)
MCH: 23.8 pg — ABNORMAL LOW (ref 26.0–34.0)
MCHC: 32 g/dL (ref 30.0–36.0)
MCV: 74.5 fL — ABNORMAL LOW (ref 80.0–100.0)
Platelets: 670 10*3/uL — ABNORMAL HIGH (ref 150–400)
RBC: 4.95 MIL/uL (ref 4.22–5.81)
RDW: 20.8 % — ABNORMAL HIGH (ref 11.5–15.5)
WBC: 9.6 10*3/uL (ref 4.0–10.5)
nRBC: 0.8 % — ABNORMAL HIGH (ref 0.0–0.2)

## 2021-02-28 MED ORDER — BACLOFEN 10 MG PO TABS
20.0000 mg | ORAL_TABLET | Freq: Three times a day (TID) | ORAL | Status: DC
  Administered 2021-02-28 – 2021-03-03 (×9): 20 mg via ORAL
  Filled 2021-02-28 (×11): qty 2

## 2021-02-28 NOTE — Progress Notes (Signed)
48 Hour Calorie Count Day 2  Estimated Nutritional Needs:    Kcal: 2000-2300kcal/day  Protein: 100-115g/day  Fluid:  1.8-2.1L/day   Dinner 10/4: 50% chicken and rice, 25% Magic Cup, 100% Ensure  Total- 583kcal and 37g protein   Breakfast 10/4: 100% pudding, 100% Ensure   Total- 460kcal and 20g protein   Lunch 10/4: 100% Ensure, meal intake not documented by nursing  Total- 250kcal/day and 20g protein   Total Intake:  1293kcal (65% estimated needs) and 77g protein (77% estimated needs)  Betsey Holiday MS, RD, LDN Please refer to [facility] for RD and/or RD on-call/weekend/after hours pager

## 2021-02-28 NOTE — Progress Notes (Signed)
[name]  ZLD:[id number] DOB: 1960/06/17 DOA: 02/20/2021 PCP: [place], MD    Brief Narrative:  60 y.o. 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  In the ED was found to have a temperature of 103.3.  He had erythema on his left lower leg consistent with cellulitis.  Blood pressure was 80s over 50s and improved after giving multiple fluid boluses.  Tested positive for COVID-19.  Date of Positive COVID Test:  02/20/21 (last day of isolation 03/02/21)  COVID-19 specific Treatment: Steroid 9/27 > 10/5 Remdesivir 9/27 > 10/1  Consultants:  None  Code Status: FULL CODE  Antimicrobials:  Cefazolin 9/29 > 10/2 Cefepime 9/27 Ceftriaxone 9/27 > 9/28 Vancomycin 9/27  DVT prophylaxis: Lovenox  Subjective: Appears to be resting comfortably in bed.  No respiratory distress or evidence of uncontrolled pain.  Assessment & Plan:  COVID -nausea vomiting and diarrhea Symptomatically resolved at this time  Left leg cellulitis Antibiotics stopped as exam actually most consistent with a venous stasis dermatitis rather than an acute infection - Unna boot in place  Possible atrial thrombus -ruled out Incidentally noted on CT -no evidence of such on TTE -no DVT on vascular duplex -felt to have likely represented artifact on CT  Dehydration Has been adequately volume resuscitated  Poor oral intake  Chronic iron deficiency anemia  OCD  Cerebral palsy leading to quadriplegia Chronically contracted extremities -requires total care  Status post right BKA  Pressure injuries Pressure Injury Arm Left;Posterior;Proximal;Upper;Other  (Comment) Stage II -  Partial thickness loss of dermis presenting as a shallow open ulcer with a red, pink wound bed without slough. pink open wound on armpit (Active)     Location: Arm  Location Orientation: Left;Posterior;Proximal;Upper;Other (Comment)  Staging: Stage II -  Partial thickness loss of dermis presenting as a shallow open ulcer with a red, pink wound bed without slough.  Wound Description (Comments): pink open wound on armpit  Present on Admission: Yes     Pressure Injury 07/31/20 Foot Left;Medial Stage 3 -  Full thickness tissue loss. Subcutaneous fat may be visible but bone, tendon or muscle are NOT exposed. (Active)  07/31/20 2152  Location: Foot  Location Orientation: Left;Medial  Staging: Stage 3 -  Full thickness tissue loss. Subcutaneous fat may be visible but bone, tendon or muscle are NOT exposed.  Wound Description (Comments):   Present on Admission: Yes     Pressure Injury 08/01/20 Knee Left;Medial Stage 1 -  Intact skin with non-blanchable redness of a localized area usually over a bony prominence. (Active)  08/01/20   Location: Knee  Location Orientation: Left;Medial  Staging: Stage 1 -  Intact skin with non-blanchable redness of a localized area usually over a bony prominence.  Wound Description (Comments):   Present on Admission: Yes     Pressure Injury 08/01/20 Sacrum Right Unstageable - Full thickness tissue loss in which the base of the injury is covered by slough (yellow, tan, gray, green or brown) and/or eschar (tan, brown or black) in the wound bed. (Active)  08/01/20   Location: Sacrum  Location Orientation: Right  Staging: Unstageable - Full thickness tissue loss in which the base of the injury is covered by slough (yellow,  tan, gray, green or brown) and/or eschar (tan, brown or black) in the wound bed.  Wound Description (Comments):   Present on Admission: Yes     Pressure Injury 10/06/20 Thigh Left;Lateral Stage 1 -  Intact skin with  non-blanchable redness of a localized area usually over a bony prominence. Yellow wound bed (Active)  10/06/20 0800 (treatment was already in place.)  Location: Thigh  Location Orientation: Left;Lateral  Staging: Stage 1 -  Intact skin with non-blanchable redness of a localized area usually over a bony prominence.  Wound Description (Comments): Yellow wound bed  Present on Admission:      Pressure Injury 10/06/20 Thigh Right;Lateral Stage 1 -  Intact skin with non-blanchable redness of a localized area usually over a bony prominence. red nonblanchable (Active)  10/06/20 0800  Location: Thigh  Location Orientation: Right;Lateral  Staging: Stage 1 -  Intact skin with non-blanchable redness of a localized area usually over a bony prominence.  Wound Description (Comments): red nonblanchable  Present on Admission:      Pressure Injury 12/12/20 Sacrum Right Unstageable - Full thickness tissue loss in which the base of the injury is covered by slough (yellow, tan, gray, green or brown) and/or eschar (tan, brown or black) in the wound bed. PT only, wound on right sacral a (Active)  12/12/20   Location: Sacrum  Location Orientation: Right  Staging: Unstageable - Full thickness tissue loss in which the base of the injury is covered by slough (yellow, tan, gray, green or brown) and/or eschar (tan, brown or black) in the wound bed.  Wound Description (Comments): PT only, wound on right sacral area, painful to touch, slough in wound bed.  Present on Admission: Yes     Pressure Injury 02/21/21 Sacrum Lower Stage 3 -  Full thickness tissue loss. Subcutaneous fat may be visible but bone, tendon or muscle are NOT exposed. (Active)  02/21/21 0850  Location: Sacrum  Location Orientation: Lower  Staging: Stage 3 -  Full thickness tissue loss. Subcutaneous fat may be visible but bone, tendon or muscle are NOT exposed.  Wound Description (Comments):   Present on Admission: Yes     Pressure Injury 02/21/21  Knee Left;Medial Stage 2 -  Partial thickness loss of dermis presenting as a shallow open injury with a red, pink wound bed without slough. (Active)  02/21/21 2100  Location: Knee  Location Orientation: Left;Medial  Staging: Stage 2 -  Partial thickness loss of dermis presenting as a shallow open injury with a red, pink wound bed without slough.  Wound Description (Comments):   Present on Admission: Yes   Family Communication: No family present at time of exam Status is: Inpatient  Remains inpatient appropriate because:Unsafe d/c plan  Dispo: The patient is from: SNF              Anticipated d/c is to: SNF              Patient currently is medically stable to d/c.   Difficult to place patient No  Objective: Blood pressure 91/62, pulse 72, temperature 98.4 F (36.9 C), temperature source Axillary, resp. rate 18, weight 60 kg, SpO2 98 %.  Intake/Output Summary (Last 24 hours) at 02/28/2021 1041 Last data filed at 02/28/2021 0129 Gross per 24 hour  Intake 957 ml  Output 675 ml  Net 282 ml   Filed Weights   02/20/21 1622 02/22/21 0324  Weight: 65 kg 60 kg    Examination: General: No acute respiratory distress Lungs:  Clear to auscultation bilaterally without wheezes or crackles Cardiovascular: Regular rate and rhythm without murmur gallop or rub normal S1 and S2 Abdomen: Nontender, nondistended, soft, bowel sounds positive, no rebound, no ascites, no appreciable mass Extremities: No significant cyanosis, clubbing, or edema bilateral lower extremities  CBC: Recent Labs  Lab 02/23/21 0542 02/28/21 0712  WBC 9.9 9.6  HGB 10.9* 11.8*  HCT 33.1* 36.9*  MCV 73.6* 74.5*  PLT 330 670*   Basic Metabolic Panel: Recent Labs  Lab 02/22/21 0517 02/28/21 0712  NA 136 138  K 4.2 3.9  CL 104 104  CO2 23 28  GLUCOSE 131* 85  BUN 20 16  CREATININE 0.43* 0.37*  CALCIUM 8.6* 8.8*   GFR: Estimated Creatinine Clearance: 83.3 mL/min (A) (by C-G formula based on SCr of 0.37 mg/dL  (L)).  Liver Function Tests: Recent Labs  Lab 02/22/21 0517  AST 32  ALT 57*  ALKPHOS 98  BILITOT 0.6  PROT 5.8*  ALBUMIN 2.1*    HbA1C: Hemoglobin A1C  Date/Time Value Ref Range Status  08/31/2015 11:50 AM 5.7  Final   Hgb A1c MFr Bld  Date/Time Value Ref Range Status  11/12/2019 12:37 PM 5.9 (H) 4.8 - 5.6 % Final    Comment:    (NOTE) Pre diabetes:          5.7%-6.4%  Diabetes:              >6.4%  Glycemic control for   <7.0% adults with diabetes   02/09/2010 02:15 PM 5.4 %     CBG: Recent Labs  Lab 02/22/21 0937 02/24/21 1011 02/26/21 1702  GLUCAP 110* 88 124*    Recent Results (from the past 240 hour(s))  Culture, blood (Routine x 2)     Status: Abnormal   Collection Time: 02/20/21 12:30 PM   Specimen: BLOOD  Result Value Ref Range Status   Specimen Description   Final    BLOOD LEFT ANTECUBITAL Performed at [facility], [address] [place], [place] [address]    Special Requests   Final    BOTTLES DRAWN AEROBIC AND ANAEROBIC Blood Culture adequate volume Performed at [facility], [address] [place], [place] [address]    Culture  Setup Time   Final    GRAM POSITIVE COCCI IN BOTH AEROBIC AND ANAEROBIC BOTTLES CRITICAL RESULT CALLED TO, READ BACK BY AND VERIFIED WITH: JASON ROBBINS PHARMD 0106 02/21/21 HNM    Culture (A)  Final    STAPHYLOCOCCUS CAPITIS VIRIDANS STREPTOCOCCUS THE SIGNIFICANCE OF ISOLATING THIS ORGANISM FROM A SINGLE SET OF BLOOD CULTURES WHEN MULTIPLE SETS ARE DRAWN IS UNCERTAIN. PLEASE NOTIFY THE MICROBIOLOGY DEPARTMENT WITHIN ONE WEEK IF SPECIATION AND SENSITIVITIES ARE REQUIRED. Performed at [facility], [address], [place], [place] [address]    Report Status 02/23/2021 FINAL  Final  Blood Culture ID Panel (Reflexed)     Status: Abnormal   Collection Time: 02/20/21 12:30 PM  Result Value Ref Range Status   Enterococcus faecalis NOT DETECTED NOT DETECTED Final   Enterococcus Faecium NOT  DETECTED NOT DETECTED Final   Listeria monocytogenes NOT DETECTED NOT DETECTED Final   Staphylococcus species NOT DETECTED NOT DETECTED Final   Staphylococcus aureus (BCID) NOT DETECTED NOT DETECTED Final   Staphylococcus epidermidis NOT DETECTED NOT DETECTED Final   Staphylococcus lugdunensis NOT DETECTED NOT DETECTED Final   Streptococcus species DETECTED (A) NOT DETECTED Final    Comment: Not Enterococcus species, Streptococcus agalactiae, Streptococcus pyogenes, or Streptococcus  pneumoniae. CRITICAL RESULT CALLED TO, READ BACK BY AND VERIFIED WITH: JASON ROBBINS PHARMD 0106 02/21/21 HNM    Streptococcus agalactiae NOT DETECTED NOT DETECTED Final   Streptococcus pneumoniae NOT DETECTED NOT DETECTED Final   Streptococcus pyogenes NOT DETECTED NOT DETECTED Final   A.calcoaceticus-baumannii NOT DETECTED NOT DETECTED Final   Bacteroides fragilis NOT DETECTED NOT DETECTED Final   Enterobacterales NOT DETECTED NOT DETECTED Final   Enterobacter cloacae complex NOT DETECTED NOT DETECTED Final   Escherichia coli NOT DETECTED NOT DETECTED Final   Klebsiella aerogenes NOT DETECTED NOT DETECTED Final   Klebsiella oxytoca NOT DETECTED NOT DETECTED Final   Klebsiella pneumoniae NOT DETECTED NOT DETECTED Final   Proteus species NOT DETECTED NOT DETECTED Final   Salmonella species NOT DETECTED NOT DETECTED Final   Serratia marcescens NOT DETECTED NOT DETECTED Final   Haemophilus influenzae NOT DETECTED NOT DETECTED Final   Neisseria meningitidis NOT DETECTED NOT DETECTED Final   Pseudomonas aeruginosa NOT DETECTED NOT DETECTED Final   Stenotrophomonas maltophilia NOT DETECTED NOT DETECTED Final   Candida albicans NOT DETECTED NOT DETECTED Final   Candida auris NOT DETECTED NOT DETECTED Final   Candida glabrata NOT DETECTED NOT DETECTED Final   Candida krusei NOT DETECTED NOT DETECTED Final   Candida parapsilosis NOT DETECTED NOT DETECTED Final   Candida tropicalis NOT DETECTED NOT DETECTED Final    Cryptococcus neoformans/gattii NOT DETECTED NOT DETECTED Final    Comment: Performed at [facility], [address] [place], [place] [address]  Culture, blood (Routine x 2)     Status: None   Collection Time: 02/20/21 12:33 PM   Specimen: BLOOD  Result Value Ref Range Status   Specimen Description BLOOD LEFT ANTECUBITAL  Final   Special Requests   Final    BOTTLES DRAWN AEROBIC AND ANAEROBIC Blood Culture adequate volume   Culture   Final    NO GROWTH 5 DAYS Performed at [facility], [address] [place], [place] [address]    Report Status 02/25/2021 FINAL  Final  Resp Panel by RT-PCR (Flu A&B, Covid) Nasopharyngeal Swab     Status: Abnormal   Collection Time: 02/20/21  1:05 PM   Specimen: Nasopharyngeal Swab; Nasopharyngeal(NP) swabs in vial transport medium  Result Value Ref Range Status   SARS Coronavirus 2 by RT PCR POSITIVE (A) NEGATIVE Final    Comment: RESULT CALLED TO, READ BACK BY AND VERIFIED WITH: LEXI OLIVER 02/20/21 1424 KLW (NOTE) SARS-CoV-2 target nucleic acids are DETECTED.  The SARS-CoV-2 RNA is generally detectable in upper respiratory specimens during the acute phase of infection. Positive results are indicative of the presence of the identified virus, but do not rule out bacterial infection or co-infection with other pathogens not detected by the test. Clinical correlation with patient history and other diagnostic information is necessary to determine patient infection status. The expected result is Negative.  Fact Sheet for Patients: BloggerCourse.com  Fact Sheet for Healthcare Providers: SeriousBroker.it  This test is not yet approved or cleared by the [place] FDA and  has been authorized for detection and/or diagnosis of SARS-CoV-2 by FDA under an Emergency Use Authorization (EUA).  This EUA will remain in effect (meaning this test can be used ) for the duration of  the  COVID-19 declaration under Section 564(b)(1) of the Act, 21 U.S.C. section 360bbb-3(b)(1), unless the authorization is terminated or revoked sooner.     Influenza A by PCR NEGATIVE NEGATIVE Final   Influenza B by PCR NEGATIVE NEGATIVE  Final    Comment: (NOTE) The Xpert Xpress SARS-CoV-2/FLU/RSV plus assay is intended as an aid in the diagnosis of influenza from Nasopharyngeal swab specimens and should not be used as a sole basis for treatment. Nasal washings and aspirates are unacceptable for Xpert Xpress SARS-CoV-2/FLU/RSV testing.  Fact Sheet for Patients: BloggerCourse.com  Fact Sheet for Healthcare Providers: SeriousBroker.it  This test is not yet approved or cleared by the [place] FDA and has been authorized for detection and/or diagnosis of SARS-CoV-2 by FDA under an Emergency Use Authorization (EUA). This EUA will remain in effect (meaning this test can be used) for the duration of the COVID-19 declaration under Section 564(b)(1) of the Act, 21 U.S.C. section 360bbb-3(b)(1), unless the authorization is terminated or revoked.  Performed at [facility], [address] [place], [place] [address]   Urine Culture     Status: None   Collection Time: 02/20/21  2:32 PM   Specimen: Urine, Random  Result Value Ref Range Status   Specimen Description   Final    URINE, RANDOM Performed at [facility], [address] [place], [place] [address]    Special Requests   Final    NONE Performed at [facility], [address], [place], [place] [address]    Culture   Final    NO GROWTH Performed at [facility], [address], [place], [place] [address]    Report Status 02/21/2021 FINAL  Final  MRSA Next Gen by PCR, Nasal     Status: None   Collection Time: 02/22/21  1:44 AM   Specimen: Nasal Mucosa; Nasal Swab  Result Value Ref Range Status   MRSA by PCR Next Gen NOT DETECTED NOT  DETECTED Final    Comment: (NOTE) The GeneXpert MRSA Assay (FDA approved for NASAL specimens only), is one component of a comprehensive MRSA colonization surveillance program. It is not intended to diagnose MRSA infection nor to guide or monitor treatment for MRSA infections. Test performance is not FDA approved in patients less than 57 years old. Performed at [facility], [address] [place], [place] [address]      Scheduled Meds:  vitamin C  500 mg Oral BID   divalproex  125 mg Oral BID   enoxaparin (LOVENOX) injection  40 mg Subcutaneous Q24H   feeding supplement  237 mL Oral TID BM   ipratropium  2 puff Inhalation Q4H   iron polysaccharides  150 mg Oral Q1200   lactulose  10 g Oral Daily   loratadine  10 mg Oral Daily   multivitamin with minerals  1 tablet Oral Daily      LOS: 8 days   Lonia Blood, MD [facility] Office  [phone] Pager - Text Page per Loretha Stapler  If 7PM-7AM, please contact night-coverage per Amion 02/28/2021, 10:41 AM

## 2021-02-28 NOTE — TOC Progression Note (Signed)
Transition of Care [facility]) - Progression Note    Patient Details  Name: CHEO SELVEY MRN: [id number] Date of Birth: 10-19-1960  Transition of Care [facility]) CM/SW Contact  [name], [place] Phone Number: 02/28/2021, 2:27 PM  Clinical Narrative:     CSW notes patient is covid + as of 8 days ago, facilities continue to require 10 day quarantine period after testing COVID positive.   CSW has lvm with Jasmine at [facility] at [phone] to inquire if patient can return at his 10 day mark on Friday, pending call back at this time.        Expected Discharge Plan and Services                                                 Social Determinants of Health (SDOH) Interventions    Readmission Risk Interventions Readmission Risk Prevention Plan 08/03/2020  Post Dischage Appt Complete  Medication Screening Complete  Some recent data might be hidden

## 2021-02-28 NOTE — Plan of Care (Signed)
  Problem: Health Behavior/Discharge Planning: Goal: Ability to manage health-related needs will improve Outcome: Progressing   Problem: Clinical Measurements: Goal: Will remain free from infection Outcome: Progressing   Problem: Safety: Goal: Ability to remain free from injury will improve Outcome: Progressing   

## 2021-03-01 MED ORDER — ACETAMINOPHEN 160 MG/5ML PO SOLN
500.0000 mg | Freq: Four times a day (QID) | ORAL | Status: DC | PRN
  Filled 2021-03-01: qty 20.3

## 2021-03-01 NOTE — Care Management Important Message (Signed)
Important Message  Patient Details  Name: ADIAN JABLONOWSKI MRN: [id number] Date of Birth: 1961-05-02   Medicare Important Message Given:  Yes  Reviewed Medicare IM with Lynda Rainwater, sister & legal guardian at [phone].  Aware of Medicare right to appeal discharge.  Copy of Medicare IM mailed to sister's attention at address provided:  [address] [place], [place] [address]   Johnell Comings 03/01/2021, 4:25 PM

## 2021-03-01 NOTE — Consult Note (Signed)
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   Had I & D with debridement in 11/2020 and wound is improving. Resides in SNF.  Measurement: 3 cm x 3.2 cm x 0.6 cm  Wound BBC:WUGQ and moist Drainage (amount, consistency, odor) minimal serosanguinous no odor Periwound: scarring from previous injury, pink and intact Dressing procedure/placement/frequency: Continue silver hydrofiber and foam dressing.   Unna boot to left leg per cardiology.  Right BKA.  LEft lower leg with dry skin, intact.  Complains of itching.  I wash the leg , inspect for breakdown and apply calamine/zinc layer secured with self adherent wrap. COntinue weekly wrap.  Will follow.  Maple Hudson MSN, RN, FNP-BC CWON Wound, Ostomy, Continence Nurse Pager [phone]

## 2021-03-01 NOTE — TOC Initial Note (Signed)
Transition of Care [facility]) - Initial/Assessment Note    Patient Details  Name: [name] MRN: [id number] Date of Birth: September 21, 1960  Transition of Care [facility]) CM/SW Contact:    [name] Griffes, LCSW Phone Number: 03/01/2021, 9:04 AM  Clinical Narrative:                  CSW informed by [facility] that patient is not a resident of their facility as previously noted.   CSW called patient's sister [name] who reports patient has been at [facility] group home for 30 years. Reports he briefly went to [facility] for rehab and on the day he was returning to the group home after being discharged from [facility] he was sick and came to the hospital. Plan for patient to return to group home at dc.   CSW spoke with group home rep [name] who initially reports patient needed a negative covid test to return to them. CSW educated [place] on how some patients can continue to test positive for several months without exhibiting symptoms, and that the standard protocol according to CDC guidelines and local facilities we have worked with include the patient having a 10 day quarantine period from the first date he tests positive. CSW informed [name] that patient is currently on day 8 , has 2 more days in quarantine period before returning to group home. [name] expressed understanding.    Expected Discharge Plan: Group Home Barriers to Discharge: Continued Medical Work up   Patient Goals and CMS Choice Patient states their goals for this hospitalization and ongoing recovery are:: to go home CMS Medicare.gov Compare Post Acute Care list provided to:: Patient Represenative (must comment) (sister) Choice offered to / list presented to : Adult Children  Expected Discharge Plan and Services Expected Discharge Plan: Group Home       Living arrangements for the past 2 months: Group Home                                      Prior Living Arrangements/Services Living  arrangements for the past 2 months: Group Home Lives with:: Facility Resident                   Activities of Daily Living   ADL Screening (condition at time of admission) Patient's cognitive ability adequate to safely complete daily activities?: No Is the patient deaf or have difficulty hearing?: No Does the patient have difficulty seeing, even when wearing glasses/contacts?: No Does the patient have difficulty concentrating, remembering, or making decisions?: Yes Patient able to express need for assistance with ADLs?: Yes Does the patient have difficulty dressing or bathing?: Yes Independently performs ADLs?: No Communication: Independent Dressing (OT): Dependent Is this a change from baseline?: Pre-admission baseline Grooming: Dependent Is this a change from baseline?: Pre-admission baseline Feeding: Dependent Is this a change from baseline?: Pre-admission baseline Bathing: Dependent Is this a change from baseline?: Pre-admission baseline Toileting: Dependent Is this a change from baseline?: Pre-admission baseline In/Out Bed: Dependent Is this a change from baseline?: Pre-admission baseline Walks in Home: Dependent Is this a change from baseline?: Pre-admission baseline Does the patient have difficulty walking or climbing stairs?: Yes Weakness of Legs: Both (quadriplegia) Weakness of Arms/Hands: Both  Permission Sought/Granted                  Emotional Assessment Appearance:: Appears stated age  Orientation: : Fluctuating Orientation (Suspected and/or reported Sundowners) Alcohol / Substance Use: Not Applicable Psych Involvement: No (comment)  Admission diagnosis:  Left leg cellulitis [L03.116] Sepsis (HCC) [A41.9] Severe sepsis (HCC) [A41.9, R65.20] Sepsis without acute organ dysfunction, due to unspecified organism (HCC) [A41.9] Pneumonia due to COVID-19 virus [U07.1, J12.82] Patient Active Problem List   Diagnosis Date Noted   Protein-calorie  malnutrition, severe 02/28/2021   Severe sepsis (HCC) 02/20/2021   Pneumonia due to COVID-19 virus 02/20/2021   Iron deficiency anemia 02/20/2021   Rash 02/20/2021   Cellulitis of left leg 02/20/2021   Obsessive compulsive disorder    Cellulitis of sacral region 12/06/2020   Right pulmonary infiltrate on CXR 10/03/2020   Dysphagia 10/03/2020   GERD without esophagitis 10/03/2020   Aspiration pneumonia (HCC) 10/03/2020   Recurrent right pleural effusion 10/02/2020   Abnormal CT scan, colon    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   Nausea and vomiting 07/31/2020   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) 10/08/2019   Osteomyelitis (HCC) 10/08/2019   Acute osteomyelitis of right ankle or foot (HCC)    Ulcerated, foot, right, with necrosis of bone (HCC)    Pseudomonas aeruginosa infection 09/06/2019   MRSA infection 09/06/2019   Lactic acidosis    Stage 3 skin ulcer of sacral region (HCC) 08/10/2018   Gastric wall thickening 08/10/2018   Alkaline phosphatase elevation 08/10/2018   Cholelithiasis 08/10/2018   Microcytic anemia    Hiatal hernia 04/07/2018   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) 01/20/2016   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/15/2015   Cerebral palsy, quadriplegic (HCC)    Pre-diabetes 08/31/2015   Foot ulcer, limited to breakdown of skin (HCC) 06/08/2015   Seasonal allergies 11/01/2014   Abdominal pain    Pressure ulcer of foot 03/14/2014   CAP (community acquired pneumonia) 03/13/2014   Neuropathy 01/13/2013   Decubitus ulcer of left hip, stage 3 (HCC) 01/08/2013   URINARY INCONTINENCE 02/09/2010   Intellectual disability 07/24/2006   Infantile cerebral palsy (HCC) 07/24/2006   PCP:  Harvest Forest, MD Pharmacy:   [facility] [place], [place] - [address] [place] [address] Phone: [phone] Fax: [phone]     Social Determinants of Health (SDOH) Interventions    Readmission Risk  Interventions Readmission Risk Prevention Plan 08/03/2020  Post Dischage Appt Complete  Medication Screening Complete  Some recent data might be hidden

## 2021-03-01 NOTE — Progress Notes (Signed)
[name]  ZYS:[id number] DOB: Sep 07, 1960 DOA: 02/20/2021 PCP: Harvest Forest, MD    Brief Narrative:  60 y.o. male local group home resident with a history of cerebral palsy with quadriplegia except use of left arm, right BKA, chronic sacral wound, hyperlipidemia, anxiety, bipolar disorder, gastric ulcer with bleeding, iron deficiency anemia, obsessive compulsive disorder, and DVT not on AC, who presented with nausea, vomiting, abdominal pain, fever, left leg pain, cough.  In the ED he was found to have a temperature of 103.3.  He had erythema on his left lower leg consistent with cellulitis.  Blood pressure was 80s over 50s and improved after giving multiple fluid boluses.  Tested positive for COVID-19.  Date of Positive COVID Test:  02/20/21 (last day of isolation 03/02/21)  COVID-19 specific Treatment: Steroid 9/27 > 10/5 Remdesivir 9/27 > 10/1  Consultants:  None  Code Status: FULL CODE  Antimicrobials:  Cefazolin 9/29 > 10/2 Cefepime 9/27 Ceftriaxone 9/27 > 9/28 Vancomycin 9/27  DVT prophylaxis: Lovenox  Subjective: Afebrile.  Vital signs stable.  Saturation 100% on room air.  Resting comfortably in bed.  Assessment & Plan:  COVID - nausea vomiting and diarrhea Symptomatically resolved at this time -last day of isolation is 03/02/2021  Left leg cellulitis Antibiotics stopped as exam actually most consistent with a venous stasis dermatitis rather than an acute infection - Unna boot in place and to be changed weekly - clinically stable  Possible atrial thrombus -ruled out Incidentally noted on CT -no evidence of such on TTE -no DVT on vascular duplex -felt to have likely represented artifact on CT  Dehydration Has been adequately volume resuscitated  Poor oral intake Encouraged patient to improve intake  Chronic iron deficiency anemia Hemoglobin stable/improving  OCD  Cerebral palsy leading to quadriplegia Chronically contracted extremities -requires  total care -resides in a group home  Status post right BKA  Pressure injuries Pressure Injury Arm Left;Posterior;Proximal;Upper;Other (Comment) Stage II -  Partial thickness loss of dermis presenting as a shallow open ulcer with a red, pink wound bed without slough. pink open wound on armpit (Active)     Location: Arm  Location Orientation: Left;Posterior;Proximal;Upper;Other (Comment)  Staging: Stage II -  Partial thickness loss of dermis presenting as a shallow open ulcer with a red, pink wound bed without slough.  Wound Description (Comments): pink open wound on armpit  Present on Admission: Yes     Pressure Injury 07/31/20 Foot Left;Medial Stage 3 -  Full thickness tissue loss. Subcutaneous fat may be visible but bone, tendon or muscle are NOT exposed. (Active)  07/31/20 2152  Location: Foot  Location Orientation: Left;Medial  Staging: Stage 3 -  Full thickness tissue loss. Subcutaneous fat may be visible but bone, tendon or muscle are NOT exposed.  Wound Description (Comments):   Present on Admission: Yes     Pressure Injury 08/01/20 Knee Left;Medial Stage 1 -  Intact skin with non-blanchable redness of a localized area usually over a bony prominence. (Active)  08/01/20   Location: Knee  Location Orientation: Left;Medial  Staging: Stage 1 -  Intact skin with non-blanchable redness of a localized area usually over a bony prominence.  Wound Description (Comments):   Present on Admission: Yes     Pressure Injury 08/01/20 Sacrum Right Unstageable - Full thickness tissue loss in which the base of the injury is covered by slough (yellow, tan, gray, green or brown) and/or eschar (tan, brown or black) in the wound bed. (Active)  08/01/20  Location: Sacrum  Location Orientation: Right  Staging: Unstageable - Full thickness tissue loss in which the base of the injury is covered by slough (yellow, tan, gray, green or brown) and/or eschar (tan, brown or black) in the wound bed.  Wound  Description (Comments):   Present on Admission: Yes     Pressure Injury 10/06/20 Thigh Left;Lateral Stage 1 -  Intact skin with non-blanchable redness of a localized area usually over a bony prominence. Yellow wound bed (Active)  10/06/20 0800 (treatment was already in place.)  Location: Thigh  Location Orientation: Left;Lateral  Staging: Stage 1 -  Intact skin with non-blanchable redness of a localized area usually over a bony prominence.  Wound Description (Comments): Yellow wound bed  Present on Admission:      Pressure Injury 10/06/20 Thigh Right;Lateral Stage 1 -  Intact skin with non-blanchable redness of a localized area usually over a bony prominence. red nonblanchable (Active)  10/06/20 0800  Location: Thigh  Location Orientation: Right;Lateral  Staging: Stage 1 -  Intact skin with non-blanchable redness of a localized area usually over a bony prominence.  Wound Description (Comments): red nonblanchable  Present on Admission:      Pressure Injury 12/12/20 Sacrum Right Unstageable - Full thickness tissue loss in which the base of the injury is covered by slough (yellow, tan, gray, green or brown) and/or eschar (tan, brown or black) in the wound bed. PT only, wound on right sacral a (Active)  12/12/20   Location: Sacrum  Location Orientation: Right  Staging: Unstageable - Full thickness tissue loss in which the base of the injury is covered by slough (yellow, tan, gray, green or brown) and/or eschar (tan, brown or black) in the wound bed.  Wound Description (Comments): PT only, wound on right sacral area, painful to touch, slough in wound bed.  Present on Admission: Yes     Pressure Injury 02/21/21 Sacrum Lower Stage 3 -  Full thickness tissue loss. Subcutaneous fat may be visible but bone, tendon or muscle are NOT exposed. (Active)  02/21/21 0850  Location: Sacrum  Location Orientation: Lower  Staging: Stage 3 -  Full thickness tissue loss. Subcutaneous fat may be visible but  bone, tendon or muscle are NOT exposed.  Wound Description (Comments):   Present on Admission: Yes     Pressure Injury 02/21/21 Knee Left;Medial Stage 2 -  Partial thickness loss of dermis presenting as a shallow open injury with a red, pink wound bed without slough. (Active)  02/21/21 2100  Location: Knee  Location Orientation: Left;Medial  Staging: Stage 2 -  Partial thickness loss of dermis presenting as a shallow open injury with a red, pink wound bed without slough.  Wound Description (Comments):   Present on Admission: Yes   Family Communication: No family present at time of exam Status is: Inpatient  Remains inpatient appropriate because:Unsafe d/c plan  Dispo: The patient is from: SNF              Anticipated d/c is to: SNF              Patient currently is medically stable to d/c.   Difficult to place patient No  Objective: Blood pressure 108/72, pulse 73, temperature 98 F (36.7 C), temperature source Oral, resp. rate 16, weight 60 kg, SpO2 100 %.  Intake/Output Summary (Last 24 hours) at 03/01/2021 1050 Last data filed at 03/01/2021 0935 Gross per 24 hour  Intake --  Output 1900 ml  Net -1900 ml  Filed Weights   02/20/21 1622 02/22/21 0324  Weight: 65 kg 60 kg    Examination: General: No acute respiratory distress Lungs: Clear to auscultation bilaterally Cardiovascular: Regular rate and rhythm without murmur Abdomen: Nontender, nondistended, soft, bowel sounds positive, no rebound, no ascites Extremities: No significant edema bilateral lower extremities  CBC: Recent Labs  Lab 02/23/21 0542 02/28/21 0712  WBC 9.9 9.6  HGB 10.9* 11.8*  HCT 33.1* 36.9*  MCV 73.6* 74.5*  PLT 330 670*    Basic Metabolic Panel: Recent Labs  Lab 02/28/21 0712  NA 138  K 3.9  CL 104  CO2 28  GLUCOSE 85  BUN 16  CREATININE 0.37*  CALCIUM 8.8*    GFR: Estimated Creatinine Clearance: 83.3 mL/min (A) (by C-G formula based on SCr of 0.37 mg/dL  (L)).    HbA1C: Hemoglobin A1C  Date/Time Value Ref Range Status  08/31/2015 11:50 AM 5.7  Final   Hgb A1c MFr Bld  Date/Time Value Ref Range Status  11/12/2019 12:37 PM 5.9 (H) 4.8 - 5.6 % Final    Comment:    (NOTE) Pre diabetes:          5.7%-6.4%  Diabetes:              >6.4%  Glycemic control for   <7.0% adults with diabetes   02/09/2010 02:15 PM 5.4 %     CBG: Recent Labs  Lab 02/24/21 1011 02/26/21 1702  GLUCAP 88 124*     Recent Results (from the past 240 hour(s))  Culture, blood (Routine x 2)     Status: Abnormal   Collection Time: 02/20/21 12:30 PM   Specimen: BLOOD  Result Value Ref Range Status   Specimen Description   Final    BLOOD LEFT ANTECUBITAL Performed at [facility], [address] [place], [place] [address]    Special Requests   Final    BOTTLES DRAWN AEROBIC AND ANAEROBIC Blood Culture adequate volume Performed at [facility], [address] [place], [place] [address]    Culture  Setup Time   Final    GRAM POSITIVE COCCI IN BOTH AEROBIC AND ANAEROBIC BOTTLES CRITICAL RESULT CALLED TO, READ BACK BY AND VERIFIED WITH: JASON ROBBINS PHARMD 0106 02/21/21 HNM    Culture (A)  Final    STAPHYLOCOCCUS CAPITIS VIRIDANS STREPTOCOCCUS THE SIGNIFICANCE OF ISOLATING THIS ORGANISM FROM A SINGLE SET OF BLOOD CULTURES WHEN MULTIPLE SETS ARE DRAWN IS UNCERTAIN. PLEASE NOTIFY THE MICROBIOLOGY DEPARTMENT WITHIN ONE WEEK IF SPECIATION AND SENSITIVITIES ARE REQUIRED. Performed at [facility], [address], [place], [place] [address]    Report Status 02/23/2021 FINAL  Final  Blood Culture ID Panel (Reflexed)     Status: Abnormal   Collection Time: 02/20/21 12:30 PM  Result Value Ref Range Status   Enterococcus faecalis NOT DETECTED NOT DETECTED Final   Enterococcus Faecium NOT DETECTED NOT DETECTED Final   Listeria monocytogenes NOT DETECTED NOT DETECTED Final   Staphylococcus species NOT DETECTED NOT DETECTED Final    Staphylococcus aureus (BCID) NOT DETECTED NOT DETECTED Final   Staphylococcus epidermidis NOT DETECTED NOT DETECTED Final   Staphylococcus lugdunensis NOT DETECTED NOT DETECTED Final   Streptococcus species DETECTED (A) NOT DETECTED Final    Comment: Not Enterococcus species, Streptococcus agalactiae, Streptococcus pyogenes, or Streptococcus pneumoniae. CRITICAL RESULT CALLED TO, READ BACK BY AND VERIFIED WITH: JASON ROBBINS PHARMD 0106 02/21/21 HNM    Streptococcus agalactiae NOT DETECTED NOT DETECTED Final   Streptococcus pneumoniae  NOT DETECTED NOT DETECTED Final   Streptococcus pyogenes NOT DETECTED NOT DETECTED Final   A.calcoaceticus-baumannii NOT DETECTED NOT DETECTED Final   Bacteroides fragilis NOT DETECTED NOT DETECTED Final   Enterobacterales NOT DETECTED NOT DETECTED Final   Enterobacter cloacae complex NOT DETECTED NOT DETECTED Final   Escherichia coli NOT DETECTED NOT DETECTED Final   Klebsiella aerogenes NOT DETECTED NOT DETECTED Final   Klebsiella oxytoca NOT DETECTED NOT DETECTED Final   Klebsiella pneumoniae NOT DETECTED NOT DETECTED Final   Proteus species NOT DETECTED NOT DETECTED Final   Salmonella species NOT DETECTED NOT DETECTED Final   Serratia marcescens NOT DETECTED NOT DETECTED Final   Haemophilus influenzae NOT DETECTED NOT DETECTED Final   Neisseria meningitidis NOT DETECTED NOT DETECTED Final   Pseudomonas aeruginosa NOT DETECTED NOT DETECTED Final   Stenotrophomonas maltophilia NOT DETECTED NOT DETECTED Final   Candida albicans NOT DETECTED NOT DETECTED Final   Candida auris NOT DETECTED NOT DETECTED Final   Candida glabrata NOT DETECTED NOT DETECTED Final   Candida krusei NOT DETECTED NOT DETECTED Final   Candida parapsilosis NOT DETECTED NOT DETECTED Final   Candida tropicalis NOT DETECTED NOT DETECTED Final   Cryptococcus neoformans/gattii NOT DETECTED NOT DETECTED Final    Comment: Performed at [facility], [address]  [place], [place] [address]  Culture, blood (Routine x 2)     Status: None   Collection Time: 02/20/21 12:33 PM   Specimen: BLOOD  Result Value Ref Range Status   Specimen Description BLOOD LEFT ANTECUBITAL  Final   Special Requests   Final    BOTTLES DRAWN AEROBIC AND ANAEROBIC Blood Culture adequate volume   Culture   Final    NO GROWTH 5 DAYS Performed at [facility], [address] [place], [place] [address]    Report Status 02/25/2021 FINAL  Final  Resp Panel by RT-PCR (Flu A&B, Covid) Nasopharyngeal Swab     Status: Abnormal   Collection Time: 02/20/21  1:05 PM   Specimen: Nasopharyngeal Swab; Nasopharyngeal(NP) swabs in vial transport medium  Result Value Ref Range Status   SARS Coronavirus 2 by RT PCR POSITIVE (A) NEGATIVE Final    Comment: RESULT CALLED TO, READ BACK BY AND VERIFIED WITH: LEXI OLIVER 02/20/21 1424 KLW (NOTE) SARS-CoV-2 target nucleic acids are DETECTED.  The SARS-CoV-2 RNA is generally detectable in upper respiratory specimens during the acute phase of infection. Positive results are indicative of the presence of the identified virus, but do not rule out bacterial infection or co-infection with other pathogens not detected by the test. Clinical correlation with patient history and other diagnostic information is necessary to determine patient infection status. The expected result is Negative.  Fact Sheet for Patients: BloggerCourse.com  Fact Sheet for Healthcare Providers: SeriousBroker.it  This test is not yet approved or cleared by the [place] FDA and  has been authorized for detection and/or diagnosis of SARS-CoV-2 by FDA under an Emergency Use Authorization (EUA).  This EUA will remain in effect (meaning this test can be used ) for the duration of  the COVID-19 declaration under Section 564(b)(1) of the Act, 21 U.S.C. section 360bbb-3(b)(1), unless the authorization is terminated or  revoked sooner.     Influenza A by PCR NEGATIVE NEGATIVE Final   Influenza B by PCR NEGATIVE NEGATIVE Final    Comment: (NOTE) The Xpert Xpress SARS-CoV-2/FLU/RSV plus assay is intended as an aid in the diagnosis of influenza from Nasopharyngeal swab specimens and should not be used  as a sole basis for treatment. Nasal washings and aspirates are unacceptable for Xpert Xpress SARS-CoV-2/FLU/RSV testing.  Fact Sheet for Patients: BloggerCourse.com  Fact Sheet for Healthcare Providers: SeriousBroker.it  This test is not yet approved or cleared by the [place] FDA and has been authorized for detection and/or diagnosis of SARS-CoV-2 by FDA under an Emergency Use Authorization (EUA). This EUA will remain in effect (meaning this test can be used) for the duration of the COVID-19 declaration under Section 564(b)(1) of the Act, 21 U.S.C. section 360bbb-3(b)(1), unless the authorization is terminated or revoked.  Performed at [facility], [address] [place], [place] [address]   Urine Culture     Status: None   Collection Time: 02/20/21  2:32 PM   Specimen: Urine, Random  Result Value Ref Range Status   Specimen Description   Final    URINE, RANDOM Performed at [facility], [address] [place], [place] [address]    Special Requests   Final    NONE Performed at [facility], [address], [place], [place] [address]    Culture   Final    NO GROWTH Performed at [facility], [address] [place], [place] [address]    Report Status 02/21/2021 FINAL  Final  MRSA Next Gen by PCR, Nasal     Status: None   Collection Time: 02/22/21  1:44 AM   Specimen: Nasal Mucosa; Nasal Swab  Result Value Ref Range Status   MRSA by PCR Next Gen NOT DETECTED NOT DETECTED Final    Comment: (NOTE) The GeneXpert MRSA Assay (FDA approved for NASAL specimens only), is one component of a  comprehensive MRSA colonization surveillance program. It is not intended to diagnose MRSA infection nor to guide or monitor treatment for MRSA infections. Test performance is not FDA approved in patients less than 57 years old. Performed at [facility], [address] [place], [place] [address]       Scheduled Meds:  baclofen  20 mg Oral TID   divalproex  125 mg Oral BID   enoxaparin (LOVENOX) injection  40 mg Subcutaneous Q24H   feeding supplement  237 mL Oral TID BM   ipratropium  2 puff Inhalation Q4H   iron polysaccharides  150 mg Oral Q1200   lactulose  10 g Oral Daily   loratadine  10 mg Oral Daily   multivitamin with minerals  1 tablet Oral Daily      LOS: 9 days   Lonia Blood, MD [facility] Office  [phone] Pager - Text Page per Loretha Stapler  If 7PM-7AM, please contact night-coverage per Amion 03/01/2021, 10:50 AM

## 2021-03-02 MED ORDER — TRAMADOL HCL 50 MG PO TABS: 50.0000 mg | ORAL_TABLET | Freq: Four times a day (QID) | ORAL | Status: DC | PRN

## 2021-03-02 NOTE — Progress Notes (Signed)
[name]  XBD:[id number] DOB: 1960/11/08 DOA: 02/20/2021 PCP: Har[place], MD    Brief Narrative:  60 y.o. male local group home resident with a history of cerebral palsy with quadriplegia except use of left arm, right BKA, chronic sacral wound, hyperlipidemia, anxiety, bipolar disorder, gastric ulcer with bleeding, iron deficiency anemia, obsessive compulsive disorder, and DVT not on AC, who presented with nausea, vomiting, abdominal pain, fever, left leg pain, cough.  In the ED he was found to have a temperature of 103.3.  He had erythema on his left lower leg consistent with cellulitis.  Blood pressure was 80s over 50s and improved after giving multiple fluid boluses.  Tested positive for COVID-19.  Date of Positive COVID Test:  02/20/21 (last day of isolation 03/02/21)  COVID-19 specific Treatment: Steroid 9/27 > 10/5 Remdesivir 9/27 > 10/1  Consultants:  None  Code Status: FULL CODE  Antimicrobials:  Cefazolin 9/29 > 10/2 Cefepime 9/27 Ceftriaxone 9/27 > 9/28 Vancomycin 9/27  DVT prophylaxis: Lovenox  Subjective: No acute events recorded overnight.  Afebrile.  Vital signs stable.  Resting comfortably in bed with no new complaints.  Assessment & Plan:  COVID - nausea vomiting and diarrhea Symptomatically resolved at this time -last day of isolation is 03/02/2021  Left leg cellulitis Antibiotics stopped as exam actually most consistent with a venous stasis dermatitis rather than an acute infection - Unna boot in place and to be changed weekly - clinically stable  Possible atrial thrombus - ruled out Incidentally noted on CT -no evidence of such on TTE -no DVT on vascular duplex -felt to have likely represented artifact on CT  Dehydration Has been adequately volume resuscitated  Poor oral intake Encouraged patient to improve intake  Chronic iron deficiency anemia Hemoglobin stable/improving  OCD  Cerebral palsy leading to quadriplegia Chronically  contracted extremities -requires total care -resides in a group home  Status post right BKA  Pressure injuries Pressure Injury Arm Left;Posterior;Proximal;Upper;Other (Comment) Stage II -  Partial thickness loss of dermis presenting as a shallow open ulcer with a red, pink wound bed without slough. pink open wound on armpit (Active)     Location: Arm  Location Orientation: Left;Posterior;Proximal;Upper;Other (Comment)  Staging: Stage II -  Partial thickness loss of dermis presenting as a shallow open ulcer with a red, pink wound bed without slough.  Wound Description (Comments): pink open wound on armpit  Present on Admission: Yes     Pressure Injury 07/31/20 Foot Left;Medial Stage 3 -  Full thickness tissue loss. Subcutaneous fat may be visible but bone, tendon or muscle are NOT exposed. (Active)  07/31/20 2152  Location: Foot  Location Orientation: Left;Medial  Staging: Stage 3 -  Full thickness tissue loss. Subcutaneous fat may be visible but bone, tendon or muscle are NOT exposed.  Wound Description (Comments):   Present on Admission: Yes     Pressure Injury 08/01/20 Knee Left;Medial Stage 1 -  Intact skin with non-blanchable redness of a localized area usually over a bony prominence. (Active)  08/01/20   Location: Knee  Location Orientation: Left;Medial  Staging: Stage 1 -  Intact skin with non-blanchable redness of a localized area usually over a bony prominence.  Wound Description (Comments):   Present on Admission: Yes     Pressure Injury 08/01/20 Sacrum Right Unstageable - Full thickness tissue loss in which the base of the injury is covered by slough (yellow, tan, gray, green or brown) and/or eschar (tan, brown or black) in the wound  bed. (Active)  08/01/20   Location: Sacrum  Location Orientation: Right  Staging: Unstageable - Full thickness tissue loss in which the base of the injury is covered by slough (yellow, tan, gray, green or brown) and/or eschar (tan, brown or  black) in the wound bed.  Wound Description (Comments):   Present on Admission: Yes     Pressure Injury 10/06/20 Thigh Left;Lateral Stage 1 -  Intact skin with non-blanchable redness of a localized area usually over a bony prominence. Yellow wound bed (Active)  10/06/20 0800 (treatment was already in place.)  Location: Thigh  Location Orientation: Left;Lateral  Staging: Stage 1 -  Intact skin with non-blanchable redness of a localized area usually over a bony prominence.  Wound Description (Comments): Yellow wound bed  Present on Admission:      Pressure Injury 10/06/20 Thigh Right;Lateral Stage 1 -  Intact skin with non-blanchable redness of a localized area usually over a bony prominence. red nonblanchable (Active)  10/06/20 0800  Location: Thigh  Location Orientation: Right;Lateral  Staging: Stage 1 -  Intact skin with non-blanchable redness of a localized area usually over a bony prominence.  Wound Description (Comments): red nonblanchable  Present on Admission:      Pressure Injury 12/12/20 Sacrum Right Unstageable - Full thickness tissue loss in which the base of the injury is covered by slough (yellow, tan, gray, green or brown) and/or eschar (tan, brown or black) in the wound bed. PT only, wound on right sacral a (Active)  12/12/20   Location: Sacrum  Location Orientation: Right  Staging: Unstageable - Full thickness tissue loss in which the base of the injury is covered by slough (yellow, tan, gray, green or brown) and/or eschar (tan, brown or black) in the wound bed.  Wound Description (Comments): PT only, wound on right sacral area, painful to touch, slough in wound bed.  Present on Admission: Yes     Pressure Injury 02/21/21 Sacrum Lower Stage 3 -  Full thickness tissue loss. Subcutaneous fat may be visible but bone, tendon or muscle are NOT exposed. (Active)  02/21/21 0850  Location: Sacrum  Location Orientation: Lower  Staging: Stage 3 -  Full thickness tissue loss.  Subcutaneous fat may be visible but bone, tendon or muscle are NOT exposed.  Wound Description (Comments):   Present on Admission: Yes     Pressure Injury 02/21/21 Knee Left;Medial Stage 2 -  Partial thickness loss of dermis presenting as a shallow open injury with a red, pink wound bed without slough. (Active)  02/21/21 2100  Location: Knee  Location Orientation: Left;Medial  Staging: Stage 2 -  Partial thickness loss of dermis presenting as a shallow open injury with a red, pink wound bed without slough.  Wound Description (Comments):   Present on Admission: Yes   Family Communication: No family present at time of exam Status is: Inpatient  Remains inpatient appropriate because:Unsafe d/c plan  Dispo: The patient is from: SNF              Anticipated d/c is to: SNF              Patient currently is medically stable to d/c.   Difficult to place patient No  Objective: Blood pressure (!) 96/39, pulse 84, temperature 98.6 F (37 C), resp. rate 18, weight 60 kg, SpO2 98 %.  Intake/Output Summary (Last 24 hours) at 03/02/2021 1010 Last data filed at 03/02/2021 0625 Gross per 24 hour  Intake 480 ml  Output 1100 ml  Net -620 ml    Filed Weights   02/20/21 1622 02/22/21 0324  Weight: 65 kg 60 kg    Examination: General: No acute respiratory distress Lungs: Clear to auscultation bilaterally Cardiovascular: Regular rate and rhythm without murmur Abdomen: Nontender, nondistended, soft, bowel sounds positive, no rebound, no ascites Extremities: No edema BLE  CBC: Recent Labs  Lab 02/28/21 0712  WBC 9.6  HGB 11.8*  HCT 36.9*  MCV 74.5*  PLT 670*    Basic Metabolic Panel: Recent Labs  Lab 02/28/21 0712  NA 138  K 3.9  CL 104  CO2 28  GLUCOSE 85  BUN 16  CREATININE 0.37*  CALCIUM 8.8*    GFR: Estimated Creatinine Clearance: 83.3 mL/min (A) (by C-G formula based on SCr of 0.37 mg/dL (L)).    HbA1C: Hemoglobin A1C  Date/Time Value Ref Range Status   08/31/2015 11:50 AM 5.7  Final   Hgb A1c MFr Bld  Date/Time Value Ref Range Status  11/12/2019 12:37 PM 5.9 (H) 4.8 - 5.6 % Final    Comment:    (NOTE) Pre diabetes:          5.7%-6.4%  Diabetes:              >6.4%  Glycemic control for   <7.0% adults with diabetes   02/09/2010 02:15 PM 5.4 %     CBG: Recent Labs  Lab 02/24/21 1011 02/26/21 1702  GLUCAP 88 124*     Recent Results (from the past 240 hour(s))  Culture, blood (Routine x 2)     Status: Abnormal   Collection Time: 02/20/21 12:30 PM   Specimen: BLOOD  Result Value Ref Range Status   Specimen Description   Final    BLOOD LEFT ANTECUBITAL Performed at [facility], [address] [place], [place] [address]    Special Requests   Final    BOTTLES DRAWN AEROBIC AND ANAEROBIC Blood Culture adequate volume Performed at [facility], [address] [place], [place] [address]    Culture  Setup Time   Final    GRAM POSITIVE COCCI IN BOTH AEROBIC AND ANAEROBIC BOTTLES CRITICAL RESULT CALLED TO, READ BACK BY AND VERIFIED WITH: JASON ROBBINS PHARMD 0106 02/21/21 HNM    Culture (A)  Final    STAPHYLOCOCCUS CAPITIS VIRIDANS STREPTOCOCCUS THE SIGNIFICANCE OF ISOLATING THIS ORGANISM FROM A SINGLE SET OF BLOOD CULTURES WHEN MULTIPLE SETS ARE DRAWN IS UNCERTAIN. PLEASE NOTIFY THE MICROBIOLOGY DEPARTMENT WITHIN ONE WEEK IF SPECIATION AND SENSITIVITIES ARE REQUIRED. Performed at [facility], [address], [place], [place] [address]    Report Status 02/23/2021 FINAL  Final  Blood Culture ID Panel (Reflexed)     Status: Abnormal   Collection Time: 02/20/21 12:30 PM  Result Value Ref Range Status   Enterococcus faecalis NOT DETECTED NOT DETECTED Final   Enterococcus Faecium NOT DETECTED NOT DETECTED Final   Listeria monocytogenes NOT DETECTED NOT DETECTED Final   Staphylococcus species NOT DETECTED NOT DETECTED Final   Staphylococcus aureus (BCID) NOT DETECTED NOT DETECTED Final    Staphylococcus epidermidis NOT DETECTED NOT DETECTED Final   Staphylococcus lugdunensis NOT DETECTED NOT DETECTED Final   Streptococcus species DETECTED (A) NOT DETECTED Final    Comment: Not Enterococcus species, Streptococcus agalactiae, Streptococcus pyogenes, or Streptococcus pneumoniae. CRITICAL RESULT CALLED TO, READ BACK BY AND VERIFIED WITH: JASON ROBBINS PHARMD 0106 02/21/21 HNM    Streptococcus agalactiae NOT DETECTED NOT DETECTED Final   Streptococcus pneumoniae NOT DETECTED NOT DETECTED  Final   Streptococcus pyogenes NOT DETECTED NOT DETECTED Final   A.calcoaceticus-baumannii NOT DETECTED NOT DETECTED Final   Bacteroides fragilis NOT DETECTED NOT DETECTED Final   Enterobacterales NOT DETECTED NOT DETECTED Final   Enterobacter cloacae complex NOT DETECTED NOT DETECTED Final   Escherichia coli NOT DETECTED NOT DETECTED Final   Klebsiella aerogenes NOT DETECTED NOT DETECTED Final   Klebsiella oxytoca NOT DETECTED NOT DETECTED Final   Klebsiella pneumoniae NOT DETECTED NOT DETECTED Final   Proteus species NOT DETECTED NOT DETECTED Final   Salmonella species NOT DETECTED NOT DETECTED Final   Serratia marcescens NOT DETECTED NOT DETECTED Final   Haemophilus influenzae NOT DETECTED NOT DETECTED Final   Neisseria meningitidis NOT DETECTED NOT DETECTED Final   Pseudomonas aeruginosa NOT DETECTED NOT DETECTED Final   Stenotrophomonas maltophilia NOT DETECTED NOT DETECTED Final   Candida albicans NOT DETECTED NOT DETECTED Final   Candida auris NOT DETECTED NOT DETECTED Final   Candida glabrata NOT DETECTED NOT DETECTED Final   Candida krusei NOT DETECTED NOT DETECTED Final   Candida parapsilosis NOT DETECTED NOT DETECTED Final   Candida tropicalis NOT DETECTED NOT DETECTED Final   Cryptococcus neoformans/gattii NOT DETECTED NOT DETECTED Final    Comment: Performed at [facility], [address] [place], [place] [address]  Culture, blood (Routine x 2)     Status: None    Collection Time: 02/20/21 12:33 PM   Specimen: BLOOD  Result Value Ref Range Status   Specimen Description BLOOD LEFT ANTECUBITAL  Final   Special Requests   Final    BOTTLES DRAWN AEROBIC AND ANAEROBIC Blood Culture adequate volume   Culture   Final    NO GROWTH 5 DAYS Performed at [facility], [address] [place], [place] [address]    Report Status 02/25/2021 FINAL  Final  Resp Panel by RT-PCR (Flu A&B, Covid) Nasopharyngeal Swab     Status: Abnormal   Collection Time: 02/20/21  1:05 PM   Specimen: Nasopharyngeal Swab; Nasopharyngeal(NP) swabs in vial transport medium  Result Value Ref Range Status   SARS Coronavirus 2 by RT PCR POSITIVE (A) NEGATIVE Final    Comment: RESULT CALLED TO, READ BACK BY AND VERIFIED WITH: LEXI OLIVER 02/20/21 1424 KLW (NOTE) SARS-CoV-2 target nucleic acids are DETECTED.  The SARS-CoV-2 RNA is generally detectable in upper respiratory specimens during the acute phase of infection. Positive results are indicative of the presence of the identified virus, but do not rule out bacterial infection or co-infection with other pathogens not detected by the test. Clinical correlation with patient history and other diagnostic information is necessary to determine patient infection status. The expected result is Negative.  Fact Sheet for Patients: BloggerCourse.com  Fact Sheet for Healthcare Providers: SeriousBroker.it  This test is not yet approved or cleared by the [place] FDA and  has been authorized for detection and/or diagnosis of SARS-CoV-2 by FDA under an Emergency Use Authorization (EUA).  This EUA will remain in effect (meaning this test can be used ) for the duration of  the COVID-19 declaration under Section 564(b)(1) of the Act, 21 U.S.C. section 360bbb-3(b)(1), unless the authorization is terminated or revoked sooner.     Influenza A by PCR NEGATIVE NEGATIVE Final    Influenza B by PCR NEGATIVE NEGATIVE Final    Comment: (NOTE) The Xpert Xpress SARS-CoV-2/FLU/RSV plus assay is intended as an aid in the diagnosis of influenza from Nasopharyngeal swab specimens and should not be used as a sole basis  for treatment. Nasal washings and aspirates are unacceptable for Xpert Xpress SARS-CoV-2/FLU/RSV testing.  Fact Sheet for Patients: BloggerCourse.com  Fact Sheet for Healthcare Providers: SeriousBroker.it  This test is not yet approved or cleared by the [place] FDA and has been authorized for detection and/or diagnosis of SARS-CoV-2 by FDA under an Emergency Use Authorization (EUA). This EUA will remain in effect (meaning this test can be used) for the duration of the COVID-19 declaration under Section 564(b)(1) of the Act, 21 U.S.C. section 360bbb-3(b)(1), unless the authorization is terminated or revoked.  Performed at [facility], [address] [place], [place] [address]   Urine Culture     Status: None   Collection Time: 02/20/21  2:32 PM   Specimen: Urine, Random  Result Value Ref Range Status   Specimen Description   Final    URINE, RANDOM Performed at [facility], [address], [place], [place] [address]    Special Requests   Final    NONE Performed at [facility], [address], [place], [place] [address]    Culture   Final    NO GROWTH Performed at [facility], [address], [place], [place] [address]    Report Status 02/21/2021 FINAL  Final  MRSA Next Gen by PCR, Nasal     Status: None   Collection Time: 02/22/21  1:44 AM   Specimen: Nasal Mucosa; Nasal Swab  Result Value Ref Range Status   MRSA by PCR Next Gen NOT DETECTED NOT DETECTED Final    Comment: (NOTE) The GeneXpert MRSA Assay (FDA approved for NASAL specimens only), is one component of a comprehensive MRSA colonization surveillance program. It is not intended to  diagnose MRSA infection nor to guide or monitor treatment for MRSA infections. Test performance is not FDA approved in patients less than 68 years old. Performed at [facility], [address] [place], [place] [address]       Scheduled Meds:  baclofen  20 mg Oral TID   divalproex  125 mg Oral BID   enoxaparin (LOVENOX) injection  40 mg Subcutaneous Q24H   feeding supplement  237 mL Oral TID BM   iron polysaccharides  150 mg Oral Q1200   lactulose  10 g Oral Daily   loratadine  10 mg Oral Daily   multivitamin with minerals  1 tablet Oral Daily      LOS: 10 days   Lonia Blood, MD [facility] Office  [phone] Pager - Text Page per Loretha Stapler  If 7PM-7AM, please contact night-coverage per Amion 03/02/2021, 10:10 AM

## 2021-03-02 NOTE — TOC Progression Note (Addendum)
Transition of Care [facility]) - Progression Note    Patient Details  Name: VALDEMAR MCCLENAHAN MRN: [id number] Date of Birth: 1960/10/06  Transition of Care [facility]) CM/SW Contact  Liliana Cline, LCSW Phone Number: 03/02/2021, 11:39 AM  Clinical Narrative:   Notified by Elnita Maxwell with [facility] that patient is active with them for Home Health.   4:58- Called [name] at patient's group home who said she thinks they should be able to take patient back tomorrow, she is just having to check with her Supervisor due to patient having COVID. Explained patient will be off of quarantine here at the hospital. She verbalized understanding and stated she will call this CSW in the morning to confirm if patient can come back tomorrow. Will need EMS.  Expected Discharge Plan: Group Home Barriers to Discharge: Continued Medical Work up  Expected Discharge Plan and Services Expected Discharge Plan: Group Home       Living arrangements for the past 2 months: Group Home                                       Social Determinants of Health (SDOH) Interventions    Readmission Risk Interventions Readmission Risk Prevention Plan 08/03/2020  Post Dischage Appt Complete  Medication Screening Complete  Some recent data might be hidden

## 2021-03-03 NOTE — NC FL2 (Signed)
[place] MEDICAID FL2 LEVEL OF CARE SCREENING TOOL     IDENTIFICATION  Patient Name: [name] Birthdate: 05-08-1961 Sex: male Admission Date (Current Location): 02/20/2021  [place] and [place] Number:  Chiropodist and Address:  [facility], [address], [place], [place] [address]      Provider Number: [id number]  Attending Physician Name and Address:  Lonia Blood, MD  Relative Name and Phone Number:  Monna Fam (Sister)   [phone] [facility])    Current Level of Care: Hospital Recommended Level of Care: Other (Comment) (Group Home with [facility]) Prior Approval Number:    Date Approved/Denied:   PASRR Number:    Discharge Plan:      Current Diagnoses: Patient Active Problem List   Diagnosis Date Noted   Protein-calorie malnutrition, severe 02/28/2021   Iron deficiency anemia 02/20/2021   Obsessive compulsive disorder    Cellulitis of sacral region 12/06/2020   Right pulmonary infiltrate on CXR 10/03/2020   Dysphagia 10/03/2020   GERD without esophagitis 10/03/2020   Aspiration pneumonia (HCC) 10/03/2020   Recurrent right pleural effusion 10/02/2020   Abnormal CT scan, colon    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   Nausea and vomiting 07/31/2020   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) 10/08/2019   Osteomyelitis (HCC) 10/08/2019   Acute osteomyelitis of right ankle or foot (HCC)    Ulcerated, foot, right, with necrosis of bone (HCC)    Pseudomonas aeruginosa infection 09/06/2019   MRSA infection 09/06/2019   Lactic acidosis    Stage 3 skin ulcer of sacral region (HCC) 08/10/2018   Gastric wall thickening 08/10/2018   Alkaline phosphatase elevation 08/10/2018   Cholelithiasis 08/10/2018   Microcytic anemia    Hiatal hernia 04/07/2018   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) 01/20/2016   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/15/2015   Cerebral palsy, quadriplegic (HCC)    Pre-diabetes  08/31/2015   Foot ulcer, limited to breakdown of skin (HCC) 06/08/2015   Seasonal allergies 11/01/2014   Pressure ulcer of foot 03/14/2014   CAP (community acquired pneumonia) 03/13/2014   Neuropathy 01/13/2013   Decubitus ulcer of left hip, stage 3 (HCC) 01/08/2013   URINARY INCONTINENCE 02/09/2010   Intellectual disability 07/24/2006   Infantile cerebral palsy (HCC) 07/24/2006    Orientation RESPIRATION BLADDER Height & Weight     Self, Time, Situation, Place  Normal Incontinent Weight: 132 lb 4.4 oz (60 kg) Height:     BEHAVIORAL SYMPTOMS/MOOD NEUROLOGICAL BOWEL NUTRITION STATUS      Incontinent Diet (dysphagia 3)  AMBULATORY STATUS COMMUNICATION OF NEEDS Skin   Total Care Verbally  (stage 3 sacrum, stage 2 knee, unna boots)                       Personal Care Assistance Level of Assistance  Bathing, Feeding, Dressing, Total care Bathing Assistance: Maximum assistance Feeding assistance: Limited assistance Dressing Assistance: Maximum assistance Total Care Assistance: Maximum assistance   Functional Limitations Info             SPECIAL CARE FACTORS FREQUENCY                       Contractures      Additional Factors Info  Code Status, Allergies Code Status Info: full code Allergies Info: adhesive (tape)           Allergies as of 03/03/2021  Reactions   Adhesive [tape] Rash        Medication List     TAKE these medications    acetaminophen 500 MG tablet Commonly known as: TYLENOL Take 500 mg by mouth every 6 (six) hours as needed (for aches, pains, and/or fevers).   albuterol 108 (90 Base) MCG/ACT inhaler Commonly known as: VENTOLIN HFA Inhale 2 puffs into the lungs every 4 (four) hours as needed for shortness of breath or wheezing.   alum & mag hydroxide-simeth 200-200-20 MG/5ML suspension Commonly known as: MAALOX/MYLANTA Take 15-30 mLs by mouth every 6 (six) hours as needed for indigestion or heartburn (or nausea).    baclofen 20 MG tablet Commonly known as: LIORESAL Take 1 tablet (20 mg total) by mouth 3 (three) times daily.   bisacodyl 5 MG EC tablet Commonly known as: DULCOLAX Take 1 tablet (5 mg total) by mouth daily as needed for moderate constipation.   cetirizine 10 MG tablet Commonly known as: ZYRTEC Take 1 tablet (10 mg total) by mouth daily.   diclofenac Sodium 1 % Gel Commonly known as: Voltaren Apply 4 g topically See admin instructions. Apply 4 grams to painful areas four times a day   divalproex 125 MG capsule Commonly known as: DEPAKOTE SPRINKLE Take 125 mg by mouth 2 (two) times daily. (0700 & 2000) What changed: Another medication with the same name was removed. Continue taking this medication, and follow the directions you see here.   Ensure Take 237 mLs by mouth 2 (two) times daily between meals.   Ferrex 150 150 MG capsule Generic drug: iron polysaccharides Take 150 mg by mouth daily at 12 noon.   fluticasone 50 MCG/ACT nasal spray Commonly known as: FLONASE Place 1 spray into both nostrils daily. (0700)   guaiFENesin-dextromethorphan 100-10 MG/5ML syrup Commonly known as: ROBITUSSIN DM Take 10 mLs by mouth every 4 (four) hours as needed for cough.   HYDROcodone-acetaminophen 5-325 MG tablet Commonly known as: NORCO/VICODIN Take 1 tablet by mouth every 4 (four) hours as needed for severe pain.   lactulose 10 GM/15ML solution Commonly known as: CHRONULAC Take 15 mLs (10 g total) by mouth daily.   liver oil-zinc oxide 40 % ointment Commonly known as: DESITIN Apply 1 application topically 2 (two) times daily as needed (unspecified).   ondansetron 4 MG tablet Commonly known as: ZOFRAN Take 1 tablet (4 mg total) by mouth every 6 (six) hours as needed for nausea.   polyethylene glycol powder 17 GM/SCOOP powder Commonly known as: GLYCOLAX/MIRALAX Take 17 g by mouth See admin instructions. Mix 17 grams as directed every morning and once daily as needed for  constipation   Prevalon Heel Protector Misc 1 Device by Does not apply route daily.   Santyl ointment Generic drug: collagenase Apply 1 application topically See admin instructions. Apply to wound in the sacral area once a day   tretinoin 0.025 % gel Commonly known as: RETIN-A Apply 1 application topically See admin instructions. Apply to the affected areas of the face at bedtime        Relevant Imaging Results:  Relevant Lab Results:   Additional Information SSS #: [id number]  Jahmil Macleod E Amaira Safley, LCSW

## 2021-03-03 NOTE — TOC Transition Note (Signed)
Transition of Care [facility]) - CM/SW Discharge Note   Patient Details  Name: NASHID PELLUM MRN: [id number] Date of Birth: 13-Feb-1961  Transition of Care [facility]) CM/SW Contact:  Liliana Cline, LCSW Phone Number: 03/03/2021, 2:37 PM   Clinical Narrative:   Discharge back to Group Home today. Confirmed with Group Home Representative [name]. Asked RN to call [name] when EMS picks patient up per her request. Updated POA/sister Dianne.  Dianne and [name] agreeable to [facility] for wound care. Referral made to [facility] with [facility]. She is aware of DC home today. [name] requested EMS. EMS paperwork completed. EMS arranged with [facility]. RN notified.     Final next level of care: Group Home Barriers to Discharge: Barriers Resolved   Patient Goals and CMS Choice Patient states their goals for this hospitalization and ongoing recovery are:: group home CMS Medicare.gov Compare Post Acute Care list provided to:: Patient Represenative (must comment) Choice offered to / list presented to : [facility] POA / Guardian  Discharge Placement                Patient to be transferred to facility by: [facility] Name of family member notified: Dianne - sister/POA Patient and family notified of of transfer: 03/03/21  Discharge Plan and Services                                     Social Determinants of Health (SDOH) Interventions     Readmission Risk Interventions Readmission Risk Prevention Plan 08/03/2020  Post Dischage Appt Complete  Medication Screening Complete  Some recent data might be hidden

## 2021-03-03 NOTE — Discharge Summary (Addendum)
DISCHARGE SUMMARY  DECKARD [name]  MR#: [id number]  DOB:06/23/1960  Date of Admission: 02/20/2021 Date of Discharge: 03/03/2021  Attending Physician:Syair Fricker Silvestre Gunner, MD  Patient's VZD:GLOVFI, [name] Brightly, MD  Consults: none  Disposition: D/C to group home   Date of Positive COVID Test: 02/20/2021  Last Day of Isolation: 03/02/2021  Wound Care: -Stage 3 pressure injury to sacrum 3 cm x 3.2 cm x 0.6 cm - Cleanse sacral wound with saline, pat dry - Cut to fit small piece of silver hydrofiber and pack into wound bed and any undermining - Top with foam dressing - Change every other day and PRN soilage. -Unna boot to left leg to be changed Q Thursday: wash the leg , inspect for breakdown and apply calamine/zinc layer secured with self adherent wrap.    Follow-up Appts:  Follow-up Information     Bakare, [name] Brightly, MD. Schedule an appointment as soon as possible for a visit.   Specialty: Internal Medicine Contact information: [address] 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 male local group home resident with a history of cerebral palsy with quadriplegia except use of left arm, right BKA, chronic sacral wound, hyperlipidemia, anxiety, bipolar disorder, gastric ulcer with bleeding, iron deficiency anemia, obsessive compulsive disorder, and DVT not on AC, who presented with nausea, vomiting, abdominal pain, fever, left leg pain, cough.   In the ED he was found to have a temperature of  103.3.  He had erythema of his left lower leg consistent with cellulitis.  Blood pressure was 80s over 50s and improved after giving multiple fluid boluses.  He tested positive for COVID-19.  Hospital Course:  COVID - nausea vomiting and diarrhea Symptomatically resolved at this time -last day of isolation is 03/02/2021   Venous stasis dermatitis - Left leg cellulitis - ruled out  Antibiotics stopped as exam actually most consistent with a venous stasis dermatitis rather than an acute infection - Unna boot in place and to be changed weekly (on Thursdays) - clinically stable   Possible atrial thrombus - ruled out Incidentally noted on CT -no evidence of such on TTE -no DVT on vascular duplex -felt to have likely represented artifact on CT   Dehydration Has been adequately volume resuscitated   Poor oral intake Encouraged patient to improve intake   Chronic iron deficiency anemia Hemoglobin stable/improving   OCD   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 pink open wound on armpit (Active)     Location: Arm  Location Orientation: Left;Posterior;Proximal;Upper;Other (Comment)  Staging: Stage II -  Partial thickness loss of dermis presenting as a shallow open ulcer with a red, pink wound bed without slough.  Wound Description (Comments): pink open wound on armpit  Present on  Admission: Yes     Pressure Injury 07/31/20 Foot Left;Medial Stage 3 -  Full thickness tissue loss. Subcutaneous fat may be visible but bone, tendon or muscle are NOT exposed. (Active)  07/31/20 2152  Location: Foot  Location Orientation: Left;Medial  Staging: Stage 3 -  Full thickness tissue loss.  Subcutaneous fat may be visible but bone, tendon or muscle are NOT exposed.  Wound Description (Comments):   Present on Admission: Yes     Pressure Injury 08/01/20 Knee Left;Medial Stage 1 -  Intact skin with non-blanchable redness of a localized area usually over a bony prominence. (Active)  08/01/20   Location: Knee  Location Orientation: Left;Medial  Staging: Stage 1 -  Intact skin with non-blanchable redness of a localized area usually over a bony prominence.  Wound Description (Comments):   Present on Admission: Yes     Pressure Injury 08/01/20 Sacrum Right Unstageable - Full thickness tissue loss in which the base of the injury is covered by slough (yellow, tan, gray, green or brown) and/or eschar (tan, brown or black) in the wound bed. (Active)  08/01/20   Location: Sacrum  Location Orientation: Right  Staging: Unstageable - Full thickness tissue loss in which the base of the injury is covered by slough (yellow, tan, gray, green or brown) and/or eschar (tan, brown or black) in the wound bed.  Wound Description (Comments):   Present on Admission: Yes     Pressure Injury 10/06/20 Thigh Left;Lateral Stage 1 -  Intact skin with non-blanchable redness of a localized area usually over a bony prominence. Yellow wound bed (Active)  10/06/20 0800 (treatment was already in place.)  Location: Thigh  Location Orientation: Left;Lateral  Staging: Stage 1 -  Intact skin with non-blanchable redness of a localized area usually over a bony prominence.  Wound Description (Comments): Yellow wound bed  Present on Admission:      Pressure Injury 10/06/20 Thigh Right;Lateral Stage 1 -  Intact skin with non-blanchable redness of a localized area usually over a bony prominence. red nonblanchable (Active)  10/06/20 0800  Location: Thigh  Location Orientation: Right;Lateral  Staging: Stage 1 -  Intact skin with non-blanchable redness of a localized area usually over a bony prominence.  Wound Description  (Comments): red nonblanchable  Present on Admission:      Pressure Injury 12/12/20 Sacrum Right Unstageable - Full thickness tissue loss in which the base of the injury is covered by slough (yellow, tan, gray, green or brown) and/or eschar (tan, brown or black) in the wound bed. PT only, wound on right sacral a (Active)  12/12/20   Location: Sacrum  Location Orientation: Right  Staging: Unstageable - Full thickness tissue loss in which the base of the injury is covered by slough (yellow, tan, gray, green or brown) and/or eschar (tan, brown or black) in the wound bed.  Wound Description (Comments): PT only, wound on right sacral area, painful to touch, slough in wound bed.  Present on Admission: Yes     Pressure Injury 02/21/21 Sacrum Lower Stage 3 -  Full thickness tissue loss. Subcutaneous fat may be visible but bone, tendon or muscle are NOT exposed. (Active)  02/21/21 0850  Location: Sacrum  Location Orientation: Lower  Staging: Stage 3 -  Full thickness tissue loss. Subcutaneous fat may be visible but bone, tendon or muscle are NOT exposed.  Wound Description (Comments):   Present on Admission: Yes     Pressure Injury 02/21/21 Knee Left;Medial Stage 2 -  Partial thickness  loss of dermis presenting as a shallow open injury with a red, pink wound bed without slough. (Active)  02/21/21 2100  Location: Knee  Location Orientation: Left;Medial  Staging: Stage 2 -  Partial thickness loss of dermis presenting as a shallow open injury with a red, pink wound bed without slough.  Wound Description (Comments):   Present on Admission: Yes        Allergies as of 03/03/2021       Reactions   Adhesive [tape] Rash        Medication List     TAKE these medications    acetaminophen 500 MG tablet Commonly known as: TYLENOL Take 500 mg by mouth every 6 (six) hours as needed (for aches, pains, and/or fevers).   albuterol 108 (90 Base) MCG/ACT inhaler Commonly known as: VENTOLIN  HFA Inhale 2 puffs into the lungs every 4 (four) hours as needed for shortness of breath or wheezing.   alum & mag hydroxide-simeth 200-200-20 MG/5ML suspension Commonly known as: MAALOX/MYLANTA Take 15-30 mLs by mouth every 6 (six) hours as needed for indigestion or heartburn (or nausea).   baclofen 20 MG tablet Commonly known as: LIORESAL Take 1 tablet (20 mg total) by mouth 3 (three) times daily.   bisacodyl 5 MG EC tablet Commonly known as: DULCOLAX Take 1 tablet (5 mg total) by mouth daily as needed for moderate constipation.   cetirizine 10 MG tablet Commonly known as: ZYRTEC Take 1 tablet (10 mg total) by mouth daily.   diclofenac Sodium 1 % Gel Commonly known as: Voltaren Apply 4 g topically See admin instructions. Apply 4 grams to painful areas four times a day   divalproex 125 MG capsule Commonly known as: DEPAKOTE SPRINKLE Take 125 mg by mouth 2 (two) times daily. (0700 & 2000) What changed: Another medication with the same name was removed. Continue taking this medication, and follow the directions you see here.   Ensure Take 237 mLs by mouth 2 (two) times daily between meals.   Ferrex 150 150 MG capsule Generic drug: iron polysaccharides Take 150 mg by mouth daily at 12 noon.   fluticasone 50 MCG/ACT nasal spray Commonly known as: FLONASE Place 1 spray into both nostrils daily. (0700)   guaiFENesin-dextromethorphan 100-10 MG/5ML syrup Commonly known as: ROBITUSSIN DM Take 10 mLs by mouth every 4 (four) hours as needed for cough.   HYDROcodone-acetaminophen 5-325 MG tablet Commonly known as: NORCO/VICODIN Take 1 tablet by mouth every 4 (four) hours as needed for severe pain.   lactulose 10 GM/15ML solution Commonly known as: CHRONULAC Take 15 mLs (10 g total) by mouth daily.   liver oil-zinc oxide 40 % ointment Commonly known as: DESITIN Apply 1 application topically 2 (two) times daily as needed (unspecified).   ondansetron 4 MG tablet Commonly  known as: ZOFRAN Take 1 tablet (4 mg total) by mouth every 6 (six) hours as needed for nausea.   polyethylene glycol powder 17 GM/SCOOP powder Commonly known as: GLYCOLAX/MIRALAX Take 17 g by mouth See admin instructions. Mix 17 grams as directed every morning and once daily as needed for constipation   Prevalon Heel Protector Misc 1 Device by Does not apply route daily.   Santyl ointment Generic drug: collagenase Apply 1 application topically See admin instructions. Apply to wound in the sacral area once a day   tretinoin 0.025 % gel Commonly known as: RETIN-A Apply 1 application topically See admin instructions. Apply to the affected areas of the face at bedtime  Day of Discharge BP 104/65 (BP Location: Right Arm)   Pulse 80   Temp 98.1 F (36.7 C) (Oral)   Resp 12   Wt 60 kg   SpO2 100%   BMI 17.94 kg/m   Physical Exam: General: No acute respiratory distress Lungs: Clear to auscultation bilaterally without wheezes or crackles Cardiovascular: Regular rate and rhythm without murmur gallop or rub normal S1 and S2 Abdomen: Nontender, nondistended, soft, bowel sounds positive, no rebound, no ascites, no appreciable mass Extremities: No significant cyanosis, clubbing, or edema bilateral lower extremities  Basic Metabolic Panel: Recent Labs  Lab 02/28/21 0712  NA 138  K 3.9  CL 104  CO2 28  GLUCOSE 85  BUN 16  CREATININE 0.37*  CALCIUM 8.8*    CBC: Recent Labs  Lab 02/28/21 0712  WBC 9.6  HGB 11.8*  HCT 36.9*  MCV 74.5*  PLT 670*    CBG: Recent Labs  Lab 02/26/21 1702  GLUCAP 124*    Recent Results (from the past 240 hour(s))  MRSA Next Gen by PCR, Nasal     Status: None   Collection Time: 02/22/21  1:44 AM   Specimen: Nasal Mucosa; Nasal Swab  Result Value Ref Range Status   MRSA by PCR Next Gen NOT DETECTED NOT DETECTED Final    Comment: (NOTE) The GeneXpert MRSA Assay (FDA approved for NASAL specimens only), is one component of a  comprehensive MRSA colonization surveillance program. It is not intended to diagnose MRSA infection nor to guide or monitor treatment for MRSA infections. Test performance is not FDA approved in patients less than 24 years old. Performed at [facility], [address] [place], [place] [address]       Time spent in discharge (includes decision making & examination of pt): 35 minutes  03/03/2021, 11:11 AM   Lonia Blood, MD [facility] Office  [phone]

## 2021-04-03 ENCOUNTER — Other Ambulatory Visit: Payer: Self-pay

## 2021-04-03 ENCOUNTER — Encounter (HOSPITAL_BASED_OUTPATIENT_CLINIC_OR_DEPARTMENT_OTHER): Payer: Medicare Other | Attending: Internal Medicine | Admitting: Internal Medicine

## 2021-04-03 DIAGNOSIS — F79 Unspecified intellectual disabilities: Secondary | ICD-10-CM | POA: Insufficient documentation

## 2021-04-03 DIAGNOSIS — L89153 Pressure ulcer of sacral region, stage 3: Secondary | ICD-10-CM | POA: Diagnosis not present

## 2021-04-03 DIAGNOSIS — Z89511 Acquired absence of right leg below knee: Secondary | ICD-10-CM

## 2021-04-03 DIAGNOSIS — X58XXXA Exposure to other specified factors, initial encounter: Secondary | ICD-10-CM | POA: Diagnosis not present

## 2021-04-03 DIAGNOSIS — S01301A Unspecified open wound of right ear, initial encounter: Secondary | ICD-10-CM

## 2021-04-03 DIAGNOSIS — G808 Other cerebral palsy: Secondary | ICD-10-CM

## 2021-04-04 NOTE — Progress Notes (Signed)
[name], [name] ([id number]) Visit Report for 04/03/2021 Chief Complaint Document Details Patient Name: Date of Service: [name], [name] 04/03/2021 7:30 A M Medical Record Number: [id number] Patient Account Number: [id number] Date of Birth/Sex: Treating RN: 1960-09-05 (60 y.o. Male) Shawn Stall Primary Care Provider: [name] Other Clinician: Referring Provider: Treating Provider/Extender: Tilda Franco in Treatment: 0 Information Obtained from: Patient Chief Complaint 04/03/2021: Sacral ulcer and right ear wound Electronic Signature(s) Signed: 04/03/2021 9:25:31 AM By: Geralyn Corwin DO Entered By: Geralyn Corwin on 04/03/2021 08:37:53 -------------------------------------------------------------------------------- Debridement Details Patient Name: Date of Service: [name]. 04/03/2021 7:30 A M Medical Record Number: [id number] Patient Account Number: [id number] Date of Birth/Sex: Treating RN: [birth year] (60 y.o. Male) Shawn Stall Primary Care Provider: [name] Other Clinician: Referring Provider: Treating Provider/Extender: Tilda Franco in Treatment: 0 Debridement Performed for Assessment: Wound #50 Sacrum Performed By: Clinician Shawn Stall, RN Debridement Type: Chemical/Enzymatic/Mechanical Agent Used: Santyl Level of Consciousness (Pre-procedure): Awake and Alert Pre-procedure Verification/Time Out No Taken: Bleeding: None Response to Treatment: Procedure was tolerated well Level of Consciousness (Post- Awake and Alert procedure): Post Debridement Measurements of Total Wound Length: (cm) 4.2 Stage: Category/Stage III Width: (cm) 2.8 Depth: (cm) 0.6 Volume: (cm) 5.542 Character of Wound/Ulcer Post Debridement: Stable Post Procedure Diagnosis Same as Pre-procedure Electronic Signature(s) Signed: 04/03/2021 9:25:31 AM By: Geralyn Corwin DO Signed: 04/04/2021 5:29:58 PM By: Shawn Stall RN, BSN Entered By: Shawn Stall on 04/03/2021 08:37:23 -------------------------------------------------------------------------------- HPI Details Patient Name: Date of Service: [name], [name] Gess. 04/03/2021 7:30 A M Medical Record Number: [id number] Patient Account Number: [id number] Date of Birth/Sex: Treating RN: Oct 16, 1960 (60 y.o. 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 He states this developed while he was in the hospital in April for community- acquired pneumonia. He has been admitted to the hospital 4 times since April. On 12/06/2020 he was admitted for sepsis secondary to MRSA bacteremia from a stage IV decubitus ulcer. He had surgical debridement on 12/08/2020 by Dr. Sheliah Hatch. He has been using Santyl ointment to the wound bed. He currently lives in a group home. He also has an open wound to his right ear that has been open for the past week. He lays on his right side consistently throughout the day while watching TV. This is a wound that has waxed and waned in the past depending on how often he watches TV. He denies signs of infection to either wound site. Electronic Signature(s) Signed: 04/03/2021 9:25:31 AM By: Geralyn Corwin DO Entered By: Geralyn Corwin on 04/03/2021 09:03:14 -------------------------------------------------------------------------------- Physical Exam Details Patient Name: Date of Service: [name], [name] 04/03/2021 7:30 A M Medical Record Number: [id number] Patient Account Number: [id number] Date of Birth/Sex: Treating RN: January 19, 1961 (60 y.o. Male) Shawn Stall Primary Care Provider: [name] Other Clinician: Referring Provider: Treating Provider/Extender: Tilda Franco in Treatment: 0 Constitutional respirations regular, non-labored and within target range for patient.Marland Kitchen Psychiatric pleasant and cooperative. Notes Sacrum: Open wound with pink tissue and tightly adhered nonviable tissue. No obvious signs of surrounding infection. Right ear: Multiple areas of inflamed skin breakdown along the helix. No obvious signs of infection. Electronic Signature(s) Signed: 04/03/2021 9:25:31 AM  By: Kalman Shan DO Entered By: Kalman Shan on 04/03/2021 09:17:34 -------------------------------------------------------------------------------- Physician Orders Details Patient Name: Date of Service: [name], [name] 04/03/2021 7:30 A M Medical Record Number: [id number] Patient Account Number: [id number] Date of Birth/Sex: Treating RN: 1960/09/30 (60 y.o. Male) Deon Pilling Primary Care Provider: Latanya Presser Other Clinician: Referring Provider: Treating Provider/Extender: Yaakov Guthrie in Treatment: 0 Verbal / Phone Orders: No Diagnosis Coding ICD-10 Coding Code Description L89.153 Pressure ulcer of sacral region, stage 3 S01.301A Unspecified open wound of right ear, initial encounter G80.8 Other cerebral palsy F79 Unspecified intellectual disabilities Follow-up Appointments ppointment in 2 weeks. - Dr. Heber Heard ***Harrel Lemon*** 60 minutes Return A Off-Loading Wound #49 Right Ear Low air-loss mattress (Group 2) - wound center to order group 2 specialty mattress. Turn and reposition every 2 hours Other: - ensure not to lay on right ear. Home Health Wound #50 Sacrum New wound care orders this week; continue Home Health for wound care. May utilize formulary equivalent dressing for wound treatment orders unless otherwise specified. - [facility] home health change three times a week all other days caregivers to  change. Wound Treatment Wound #49 - Ear Wound Laterality: Right Cleanser: Soap and Water (Home Health) 1 x Per Day/30 Days Discharge Instructions: May shower and wash wound with dial antibacterial soap and water prior to dressing change. Cleanser: Wound Cleanser (Home Health) 1 x Per Day/30 Days Discharge Instructions: Cleanse the wound with wound cleanser prior to applying a clean dressing using gauze sponges, not tissue or cotton balls. Topical: triple antibiotic ointment (Home Health) 1 x Per Day/30 Days Discharge Instructions: apply triple antibiotic ointment Secondary Dressing: bandaid or foam border (Home Health) 1 x Per Day/30 Days Discharge Instructions: apply over the triple antibiotic ointment. Wound #50 - Sacrum Cleanser: Wound Cleanser (Home Health) 1 x Per Day/30 Days Discharge Instructions: Cleanse the wound with wound cleanser prior to applying a clean dressing using gauze sponges, not tissue or cotton balls. Peri-Wound Care: Skin Prep (Home Health) 1 x Per Day/30 Days Discharge Instructions: Use skin prep as directed Peri-Wound Care: Zinc Oxide Ointment 30g tube (Home Health) 1 x Per Day/30 Days Discharge Instructions: Apply Zinc Oxide as needed for any maceration to periwound with each dressing change Prim Dressing: Santyl Ointment (Home Health) 1 x Per Day/30 Days ary Discharge Instructions: Apply nickel thick amount to wound bed as instructed Prim Dressing: saline moisten gauze wet to dry (Home Health) 1 x Per Day/30 Days ary Discharge Instructions: saline moisten gauze wet to dry apply on santyl. Secondary Dressing: Zetuvit Plus Silicone Border Dressing 5x5 (in/in) (Home Health) 1 x Per Day/30 Days Discharge Instructions: Apply silicone border over primary dressing as directed. Patient Medications llergies: adhesive tape A Notifications Medication Indication Start End lidocaine DOSE topical 4 % gel - gel topical applied only in wound center. Electronic  Signature(s) Signed: 04/03/2021 9:25:31 AM By: Kalman Shan DO Entered By: Kalman Shan on 04/03/2021 09:19:30 Prescription 04/03/2021 -------------------------------------------------------------------------------- [name], [name] Kalman Shan DO Patient Name: Provider: Feb 03, 1961 [id number] Date of Birth: NPI#: Male [id number] Sex: DEA #: [phone] Phone #: License #: [place] Patient Address: [address] [name], [place] [address] [place], [place] [address] [phone] Allergies adhesive tape Medication Medication: Route: Strength: Form: lidocaine 4 % topical gel topical 4% gel Class: TOPICAL LOCAL ANESTHETICS Dose: Frequency / Time: Indication: gel topical applied only in wound center. Number of Refills: Number of Units: 0 Generic Substitution: Start  Date: End Date: One Time Use: Substitution Permitted No Note to Pharmacy: Hand Signature: Date(s): Electronic Signature(s) Signed: 04/03/2021 9:25:31 AM By: Kalman Shan DO Entered By: Kalman Shan on 04/03/2021 09:19:32 -------------------------------------------------------------------------------- Problem List Details Patient Name: Date of Service: [name]. 04/03/2021 7:30 A M Medical Record Number: [id number] Patient Account Number: [id number] Date of Birth/Sex: Treating RN: [birth year] (60 y.o. Male) Deon Pilling Primary Care Provider: Latanya Presser Other Clinician: Referring Provider: Treating Provider/Extender: Yaakov Guthrie in Treatment: 0 Active Problems ICD-10 Encounter Code Description Active Date MDM Diagnosis L89.153 Pressure ulcer of sacral region, stage 3 04/03/2021 No Yes S01.301A Unspecified open wound of right ear, initial encounter 04/03/2021 No Yes G80.8 Other cerebral palsy 04/03/2021 No Yes F79 Unspecified intellectual disabilities 04/03/2021 No Yes Z89.511  Acquired absence of right leg below knee 04/03/2021 No Yes Inactive Problems Resolved Problems Electronic Signature(s) Signed: 04/03/2021 9:25:31 AM By: Kalman Shan DO Entered By: Kalman Shan on 04/03/2021 08:36:47 -------------------------------------------------------------------------------- Progress Note Details Patient Name: Date of Service: [name], [name] Gala. 04/03/2021 7:30 A M Medical Record Number: [id number] Patient Account Number: [id number] Date of Birth/Sex: Treating RN: [birth year] (60 y.o. Male) Deon Pilling Primary Care Provider: Latanya Presser Other Clinician: Referring Provider: Treating Provider/Extender: Yaakov Guthrie in Treatment: 0 Subjective Chief Complaint Information obtained from Patient 04/03/2021: Sacral ulcer and right ear wound History of Present Illness (HPI) Admission 04/03/2021 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 He states this developed while he was in the hospital in April for community- acquired pneumonia. He has been admitted to the hospital 4 times since April. On 12/06/2020 he was admitted for sepsis secondary to MRSA bacteremia from a stage IV decubitus ulcer. He had surgical debridement on 12/08/2020 by Dr. Kieth Brightly. He has been using Santyl ointment to the wound bed. He currently lives in a group home. He also has an open wound to his right ear that has been open for the past week. He lays on his right side consistently throughout the day while watching TV. This is a wound that has waxed and waned in the past depending on how often he watches TV. He denies signs of infection to either wound site. Patient History Information obtained from Patient. Allergies adhesive tape (Severity: Moderate, Reaction: rash) Family  History Unknown History. Social History Never smoker, Marital Status - Single, Alcohol Use - Never, Drug Use - No History, Caffeine Use - Moderate. Medical History Eyes Denies history of Cataracts, Glaucoma, Optic Neuritis Ear/Nose/Mouth/Throat Denies history of Chronic sinus problems/congestion, Middle ear problems Hematologic/Lymphatic Patient has history of Anemia - microcytic Respiratory Denies history of Aspiration, Asthma, Chronic Obstructive Pulmonary Disease (COPD), Pneumothorax, Sleep Apnea, Tuberculosis Cardiovascular Patient has history of Deep Vein Thrombosis Denies history of Angina, Arrhythmia, Congestive Heart Failure, Coronary Artery Disease, Hypertension, Hypotension, Myocardial Infarction, Peripheral Arterial Disease, Peripheral Venous Disease, Phlebitis, Vasculitis Gastrointestinal Patient has history of Colitis Denies history of Cirrhosis , Crohnoos, Hepatitis A, Hepatitis B, Hepatitis C Endocrine Denies history of Type I Diabetes, Type II Diabetes Genitourinary Denies history of End Stage Renal Disease Immunological Denies history of Lupus Erythematosus, Raynaudoos, Scleroderma Integumentary (Skin) Denies history of History of Burn Musculoskeletal Patient has history of Osteomyelitis Neurologic Patient has history of Neuropathy, Quadriplegia - spastic Oncologic Denies history of Received Chemotherapy, Received Radiation Psychiatric Patient has history of Confinement Anxiety  Denies history of Anorexia/bulimia Hospitalization/Surgery History - surgery to straughten legs. 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 - hiatal hernia repair. - (R) gt toe amputation. - Constipation. 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 - Pneumonia. - left BKA 3//07/2020. - PNA 07/2020. - SNF from inpatient with PNA till 02/24/2021. Medical A Surgical History Notes nd Co-Morbid Conditions h/o spiral fracture distal (L) tibia , h/o shingles , MR , infantile CP Ear/Nose/Mouth/Throat hiatal  hernia Respiratory h/o pneumonia , chronic bronchitis Cardiovascular hypercholesterolemia Gastrointestinal acute gastric ulcer with hemorrhage , constipation , GERD , Abdominal Distention Genitourinary incontinence (on Detrol) Renal Glycosuria Integumentary (Skin) Eczema Acne Vulgaris Onychomycosis Pressure Ulcers Musculoskeletal DJD of shoulder Neurologic cerebral palsy Psychiatric general anxiety , bipolar , depression, Mental Retardation Review of Systems (ROS) Constitutional Symptoms (General Health) Denies complaints or symptoms of Fatigue, Fever, Chills, Marked Weight Change. Eyes Denies complaints or symptoms of Dry Eyes, Vision Changes, Glasses / Contacts. Ear/Nose/Mouth/Throat Denies complaints or symptoms of Chronic sinus problems or rhinitis. Respiratory Denies complaints or symptoms of Chronic or frequent coughs, Shortness of Breath. Cardiovascular Denies complaints or symptoms of Chest pain. Gastrointestinal Denies complaints or symptoms of Frequent diarrhea, Nausea, Vomiting. Endocrine Denies complaints or symptoms of Heat/cold intolerance. Genitourinary Denies complaints or symptoms of Frequent urination. Integumentary (Skin) Denies complaints or symptoms of Wounds. Objective Constitutional respirations regular, non-labored and within target range for patient.. Vitals Time Taken: 7:45 AM, Temperature: 98.3 F, Pulse: 98 bpm, Respiratory Rate: 20 breaths/min, Blood Pressure: 115/77 mmHg. Psychiatric pleasant and cooperative. General Notes: Sacrum: Open wound with pink tissue and tightly adhered nonviable tissue. No obvious signs of surrounding infection. Right ear: Multiple areas of inflamed skin breakdown along the helix. No obvious signs of infection. Integumentary (Hair, Skin) Wound #49 status is Open. Original cause of wound was Pressure Injury. The date acquired was: 03/27/2021. The wound is located on the Right Ear. The wound measures 1.7cm length x  0.5cm width x 0.1cm depth; 0.668cm^2 area and 0.067cm^3 volume. There is Fat Layer (Subcutaneous Tissue) exposed. There is no tunneling or undermining noted. There is a medium amount of serosanguineous drainage noted. The wound margin is distinct with the outline attached to the wound base. There is large (67-100%) red granulation within the wound bed. There is no necrotic tissue within the wound bed. Wound #50 status is Open. Original cause of wound was Pressure Injury. The date acquired was: 08/25/2020. The wound is located on the Sacrum. The wound measures 4.2cm length x 2.8cm width x 0.6cm depth; 9.236cm^2 area and 5.542cm^3 volume. There is Fat Layer (Subcutaneous Tissue) exposed. There is no tunneling noted, however, there is undermining starting at 12:00 and ending at 6:00 with a maximum distance of 1cm. There is a medium amount of serosanguineous drainage noted. The wound margin is distinct with the outline attached to the wound base. There is large (67-100%) red, pink granulation within the wound bed. There is a small (1-33%) amount of necrotic tissue within the wound bed including Adherent Slough. Assessment Active Problems ICD-10 Pressure ulcer of sacral region, stage 3 Unspecified open wound of right ear, initial encounter Other cerebral palsy Unspecified intellectual disabilities Acquired absence of right leg below knee Mr. ARVEY LADER is a chronically ill individual with a 5-month history of sacral ulcer status post surgical debridement on 12/08/2020. He is using Santyl to the erry wound bed. I think this is fine to continue as he still has areas of non viable tissue. We discussed the importance of aggressive offloading for wound  healing. He expressed understanding. We will order him a group 2 air mattress. He also has a 1 week history of wounds to his right ear. I recommended staying off the right side for the 1 to 2 weeks in order to allow this to heal. He can place antibiotic ointment  on this area. No obvious signs of infection on exam. Follow-up in 2 weeks. 47 minutes was spent on the encounter including face-to-face, EMR review and coordination of care Procedures Wound #50 Pre-procedure diagnosis of Wound #50 is a Pressure Ulcer located on the Sacrum . There was a Chemical/Enzymatic/Mechanical debridement performed by Deon Pilling, RN.Marland Kitchen Agent used was [employer]. There was no bleeding. The procedure was tolerated well. Post Debridement Measurements: 4.2cm length x 2.8cm width x 0.6cm depth; 5.542cm^3 volume. Post debridement Stage noted as Category/Stage III. Character of Wound/Ulcer Post Debridement is stable. Post procedure Diagnosis Wound #50: Same as Pre-Procedure Plan Follow-up Appointments: Return Appointment in 2 weeks. - Dr. Heber Gibson ***Harrel Lemon*** 60 minutes Off-Loading: Wound #49 Right Ear: Low air-loss mattress (Group 2) - wound center to order group 2 specialty mattress. Turn and reposition every 2 hours Other: - ensure not to lay on right ear. Home Health: Wound #50 Sacrum: New wound care orders this week; continue Home Health for wound care. May utilize formulary equivalent dressing for wound treatment orders unless otherwise specified. - [facility] home health change three times a week all other days caregivers to change. The following medication(s) was prescribed: lidocaine topical 4 % gel gel topical applied only in wound center. was prescribed at facility WOUND #49: - Ear Wound Laterality: Right Cleanser: Soap and Water (Home Health) 1 x Per Day/30 Days Discharge Instructions: May shower and wash wound with dial antibacterial soap and water prior to dressing change. Cleanser: Wound Cleanser (Home Health) 1 x Per Day/30 Days Discharge Instructions: Cleanse the wound with wound cleanser prior to applying a clean dressing using gauze sponges, not tissue or cotton balls. Topical: triple antibiotic ointment (Home Health) 1 x Per Day/30 Days Discharge  Instructions: apply triple antibiotic ointment Secondary Dressing: bandaid or foam border (Home Health) 1 x Per Day/30 Days Discharge Instructions: apply over the triple antibiotic ointment. WOUND #50: - Sacrum Wound Laterality: Cleanser: Wound Cleanser (Home Health) 1 x Per Day/30 Days Discharge Instructions: Cleanse the wound with wound cleanser prior to applying a clean dressing using gauze sponges, not tissue or cotton balls. Peri-Wound Care: Skin Prep (Home Health) 1 x Per Day/30 Days Discharge Instructions: Use skin prep as directed Peri-Wound Care: Zinc Oxide Ointment 30g tube (Home Health) 1 x Per Day/30 Days Discharge Instructions: Apply Zinc Oxide as needed for any maceration to periwound with each dressing change Prim Dressing: Santyl Ointment (Home Health) 1 x Per Day/30 Days ary Discharge Instructions: Apply nickel thick amount to wound bed as instructed Prim Dressing: saline moisten gauze wet to dry (Home Health) 1 x Per Day/30 Days ary Discharge Instructions: saline moisten gauze wet to dry apply on santyl. Secondary Dressing: Zetuvit Plus Silicone Border Dressing 5x5 (in/in) (Home Health) 1 x Per Day/30 Days Discharge Instructions: Apply silicone border over primary dressing as directed. 1. Santyl 2. Antibiotic ointment 3. Aggressive offloading 4. Follow-up in 2 weeks Electronic Signature(s) Signed: 04/03/2021 9:25:31 AM By: Kalman Shan DO Entered By: Kalman Shan on 04/03/2021 09:24:38 -------------------------------------------------------------------------------- HxROS Details Patient Name: Date of Service: [name], [name] 04/03/2021 7:30 A M Medical Record Number: [id number] Patient Account Number: [id number] Date of Birth/Sex: Treating RN: 1961-04-20 (60 y.o. Male) [name],  [id number] Primary Care Provider: Latanya Presser Other Clinician: Referring Provider: Treating Provider/Extender: Yaakov Guthrie in Treatment: 0 Information Obtained  From Patient Constitutional Symptoms (General Health) Complaints and Symptoms: Negative for: Fatigue; Fever; Chills; Marked Weight Change Eyes Complaints and Symptoms: Negative for: Dry Eyes; Vision Changes; Glasses / Contacts Medical History: Negative for: Cataracts; Glaucoma; Optic Neuritis Ear/Nose/Mouth/Throat Complaints and Symptoms: Negative for: Chronic sinus problems or rhinitis Medical History: Negative for: Chronic sinus problems/congestion; Middle ear problems Past Medical History Notes: hiatal hernia Respiratory Complaints and Symptoms: Negative for: Chronic or frequent coughs; Shortness of Breath Medical History: Negative for: Aspiration; Asthma; Chronic Obstructive Pulmonary Disease (COPD); Pneumothorax; Sleep Apnea; Tuberculosis Past Medical History Notes: h/o pneumonia , chronic bronchitis Cardiovascular Complaints and Symptoms: Negative for: Chest pain Medical History: Positive for: Deep Vein Thrombosis Negative for: Angina; Arrhythmia; Congestive Heart Failure; Coronary Artery Disease; Hypertension; Hypotension; Myocardial Infarction; Peripheral Arterial Disease; Peripheral Venous Disease; Phlebitis; Vasculitis Past Medical History Notes: hypercholesterolemia Gastrointestinal Complaints and Symptoms: Negative for: Frequent diarrhea; Nausea; Vomiting Medical History: Positive for: Colitis Negative for: Cirrhosis ; Crohns; Hepatitis A; Hepatitis B; Hepatitis C Past Medical History Notes: acute gastric ulcer with hemorrhage , constipation , GERD , Abdominal Distention Endocrine Complaints and Symptoms: Negative for: Heat/cold intolerance Medical History: Negative for: Type I Diabetes; Type II Diabetes Genitourinary Complaints and Symptoms: Negative for: Frequent urination Medical History: Negative for: End Stage Renal Disease Past Medical History Notes: incontinence (on Detrol) Renal Glycosuria Integumentary (Skin) Complaints and  Symptoms: Negative for: Wounds Medical History: Negative for: History of Burn Past Medical History Notes: Eczema Acne Vulgaris Onychomycosis Pressure Ulcers Co-Morbid Conditions Medical History: Past Medical History Notes: h/o spiral fracture distal (L) tibia , h/o shingles , MR , infantile CP Hematologic/Lymphatic Medical History: Positive for: Anemia - microcytic Immunological Medical History: Negative for: Lupus Erythematosus; Raynauds; Scleroderma Musculoskeletal Medical History: Positive for: Osteomyelitis Past Medical History Notes: DJD of shoulder Neurologic Medical History: Positive for: Neuropathy; Quadriplegia - spastic Past Medical History Notes: cerebral palsy Oncologic Medical History: Negative for: Received Chemotherapy; Received Radiation Psychiatric Medical History: Positive for: Confinement Anxiety Negative for: Anorexia/bulimia Past Medical History Notes: general anxiety , bipolar , depression, Mental Retardation Immunizations Pneumococcal Vaccine: Received Pneumococcal Vaccination: Yes Received Pneumococcal Vaccination On or After 60th Birthday: Yes Implantable Devices None Hospitalization / Surgery History Type of Hospitalization/Surgery surgery to straughten legs 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) hiatal hernia repair (R) gt toe amputation Constipation 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 Pneumonia left BKA 3//07/2020 PNA 07/2020 SNF from inpatient with PNA till 02/24/2021 Family and Social History Unknown History: Yes; Never smoker; Marital Status - Single; Alcohol Use: Never; Drug Use: No History; Caffeine Use: Moderate; Financial Concerns: No; Food, Clothing or Shelter Needs: No; Support System Lacking: No; Transportation Concerns: No Electronic Signature(s) Signed: 04/03/2021 9:25:31 AM By: Kalman Shan DO Signed: 04/04/2021 5:29:58 PM By: Deon Pilling RN, BSN Entered By: Deon Pilling on 04/03/2021  08:05:16 -------------------------------------------------------------------------------- SuperBill Details Patient Name: Date of Service: [name] 04/03/2021 Medical Record Number: [id number] Patient Account Number: [id number] Date of Birth/Sex: Treating RN: Sep 10, 1960 (60 y.o. 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 Unspecified intellectual disabilities Z89.511 Acquired absence of right leg below knee Facility Procedures CPT4 Code: [id number] Description: [place] VISIT-LEV 4  EST PT Modifier: Quantity: 1 CPT4 Code: [id number] Description: [id number] - [place] W/O ANES NON SELECT Modifier: Quantity: 1 Physician Procedures : CPT4 Code Description Modifier [id number] - WC PHYS LEVEL 4 - EST PT 1 ICD-10 Diagnosis Description L89.153 Pressure ulcer of sacral region, stage 3 S01.301A Unspecified open wound of right ear, initial encounter G80.8 Other cerebral palsy Z89.511  Acquired absence of right leg below knee Quantity: Electronic Signature(s) Signed: 04/03/2021 9:25:31 AM By: Kalman Shan DO Entered By: Kalman Shan on 04/03/2021 09:24:57

## 2021-04-04 NOTE — Progress Notes (Signed)
JAMIEON, DOLMAN ([id number]) Visit Report for 04/03/2021 Abuse/Suicide Risk Screen Details Patient Name: Date of Service: [name], [name] 04/03/2021 7:30 A M Medical Record Number: [id number] Patient Account Number: [id number] Date of Birth/Sex: Treating RN: [birth year] (60 y.o. Male) Shawn Stall Primary Care Shantelle Alles: [name] Other Clinician: Referring Deunte Bledsoe: Treating Yomara Toothman/Extender: Tilda Franco in Treatment: 0 Abuse/Suicide Risk Screen Items Answer ABUSE RISK SCREEN: Has anyone close to you tried to hurt or harm you recentlyo No Do you feel uncomfortable with anyone in your familyo No Has anyone forced you do things that you didnt want to doo No Electronic Signature(s) Signed: 04/04/2021 5:29:58 PM By: Shawn Stall RN, BSN Entered By: Shawn Stall on 04/03/2021 07:49:50 -------------------------------------------------------------------------------- Activities of Daily Living Details Patient Name: Date of Service: [name], [name] 04/03/2021 7:30 A M Medical Record Number: [id number] Patient Account Number: [id number] Date of Birth/Sex: Treating RN: [birth year] (60 y.o. Male) Shawn Stall Primary Care Righteous Claiborne: [name] Other Clinician: Referring Khaliah Barnick: Treating Kathey Simer/Extender: Tilda Franco in Treatment: 0 Activities of Daily Living Items Answer Activities of Daily Living (Please select one for each item) Drive Automobile Not Able T Medications ake Need Assistance Use T elephone Need Assistance Care for Appearance Need Assistance Use T oilet Need Assistance Bath / Shower Need Assistance Dress Self Need Assistance Feed Self Need Assistance Walk Not Able Get In / Out Bed Need Assistance Housework Not Able Prepare Meals Not Able Handle Money Not Able Shop for Self Need Assistance Electronic Signature(s) Signed: 04/04/2021 5:29:58 PM By: Shawn Stall RN, BSN Entered By: Shawn Stall on 04/03/2021  07:50:41 -------------------------------------------------------------------------------- Education Screening Details Patient Name: Date of Service: [name], [name] Gess. 04/03/2021 7:30 A M Medical Record Number: [id number] Patient Account Number: [id number] Date of Birth/Sex: Treating RN: 19-Sep-1960 (60 y.o. Male) Shawn Stall Primary Care Brayam Boeke: [name] Other Clinician: Referring Samual Beals: Treating Lakishia Bourassa/Extender: Tilda Franco in Treatment: 0 Primary Learner Assessed: Patient Learning Preferences/Education Level/Primary Language Learning Preference: Explanation, Demonstration, Printed Material Highest Education Level: Grade School Preferred Language: English Cognitive Barrier Language Barrier: No Translator Needed: No Memory Deficit: No Emotional Barrier: Yes cognitive impairment Cultural/Religious Beliefs Affecting Medical Care: No Physical Barrier Impaired Vision: No Impaired Hearing: No Decreased Hand dexterity: No Knowledge/Comprehension Knowledge Level: Medium Comprehension Level: Medium Ability to understand written instructions: Medium Ability to understand verbal instructions: Medium Motivation Anxiety Level: Calm Cooperation: Cooperative Education Importance: Acknowledges Need Interest in Health Problems: Asks Questions Perception: Coherent Willingness to Engage in Self-Management Medium Activities: Readiness to Engage in Self-Management Medium Activities: Electronic Signature(s) Signed: 04/04/2021 5:29:58 PM By: Shawn Stall RN, BSN Entered By: Shawn Stall on 04/03/2021 07:51:32 -------------------------------------------------------------------------------- Fall Risk Assessment Details Patient Name: Date of Service: [name], [name] Gess. 04/03/2021 7:30 A M Medical Record Number: [id number] Patient Account Number: [id number] Date of Birth/Sex: Treating RN: January 08, 1961 (60 y.o. Male) Shawn Stall Primary Care Santiago Graf: [name] Other Clinician: Referring Jariel Drost: Treating Oseph Imburgia/Extender: Tilda Franco in Treatment: 0 Fall Risk Assessment Items Have you had 2 or more falls in the last 12 monthso 0 No Have you had any fall that resulted in injury in the last 12 monthso 0 No FALLS RISK SCREEN History of falling - immediate or within 3 months 0 No Secondary diagnosis (Do you have 2 or more medical diagnoseso) 0 No Ambulatory aid None/bed rest/wheelchair/nurse 0 Yes Crutches/cane/walker 0 No Furniture 0 No Intravenous therapy Access/Saline/Heparin Lock 0 No Gait/Transferring Normal/ bed rest/ wheelchair 0 Yes Weak (short steps with or without shuffle, stooped but able  to lift head while walking, may seek 0 No support from furniture) Impaired (short steps with shuffle, may have difficulty arising from chair, head down, impaired 0 No balance) Mental Status Oriented to own ability 0 Yes Electronic Signature(s) Signed: 04/04/2021 5:29:58 PM By: Shawn Stall RN, BSN Entered By: Shawn Stall on 04/03/2021 07:51:48 -------------------------------------------------------------------------------- Foot Assessment Details Patient Name: Date of Service: [name], [name] Gess. 04/03/2021 7:30 A M Medical Record Number: [id number] Patient Account Number: [id number] Date of Birth/Sex: Treating RN: May 27, 1961 (60 y.o. Male) Shawn Stall Primary Care Deberah Adolf: [name] Other Clinician: Referring Chakita Mcgraw: Treating Katori Wirsing/Extender: Tilda Franco in Treatment: 0 Foot Assessment Items Site Locations + = Sensation present, - = Sensation absent, C = Callus, U = Ulcer R = Redness, W = Warmth, M = Maceration, PU = Pre-ulcerative lesion F = Fissure, S = Swelling, D = Dryness Assessment Right: Left: Other Deformity: No No Prior Foot Ulcer: No No Prior Amputation: No Yes Charcot Joint: No No Ambulatory Status: Non-ambulatory Assistance Device: Wheelchair Gait: Investment banker, corporate) Signed: 04/04/2021 5:29:58 PM By: Shawn Stall RN, BSN Entered By: Shawn Stall on 04/03/2021 07:53:16 -------------------------------------------------------------------------------- Nutrition Risk Screening Details Patient Name: Date of Service: [name], [name] 04/03/2021 7:30 A M Medical Record Number: [id number] Patient Account Number: [id number] Date of Birth/Sex: Treating RN: [birth year] (60 y.o. Male) Shawn Stall Primary Care Adilyn Humes: [name] Other Clinician: Referring Claiborne Stroble: Treating Tammye Kahler/Extender: Tilda Franco in Treatment: 0 Height (in): Weight (lbs): Body Mass Index (BMI): Nutrition Risk Screening Items Score Screening NUTRITION RISK SCREEN: I have an illness or condition that made me change the kind and/or amount of food I eat 2 Yes I eat fewer than two meals per day 0 No I eat few fruits and vegetables, or milk products 0 No I have three or more drinks of beer, liquor or wine almost every day 0 No I have tooth or mouth problems that make it hard for me to eat 0 No I don't always have enough money to buy the food I need 0 No I eat alone most of the time 0 No I take three or more different prescribed or over-the-counter drugs a day 1 Yes Without wanting to, I have lost or gained 10 pounds in the last six months 0 No I am not always physically able to shop, cook and/or feed myself 0 No Nutrition Protocols Good Risk Protocol Moderate Risk Protocol 0 Provide education on nutrition High Risk Proctocol Risk Level: Moderate Risk Score: 3 Electronic Signature(s) Signed: 04/04/2021 5:29:58 PM By: Shawn Stall RN, BSN Entered By: Shawn Stall on 04/03/2021 07:52:31

## 2021-04-04 NOTE — Progress Notes (Signed)
Topical Primary Dressing Santyl Ointment Discharge Instruction: Apply nickel thick amount to wound bed as instructed saline moisten gauze wet to dry Discharge Instruction: saline moisten gauze wet to dry apply on santyl. Secondary Dressing Zetuvit Plus Silicone Border Dressing 5x5 (in/in) Discharge Instruction: Apply silicone border over primary dressing as directed. Secured With Compression Wrap Compression Stockings Environmental education officer) Signed: 04/03/2021 5:12:04 PM By: Baruch Gouty RN, BSN Signed: 04/04/2021 5:29:58 PM By: Deon Pilling RN, BSN Entered By: Baruch Gouty on 04/03/2021 08:09:54 -------------------------------------------------------------------------------- Vitals Details Patient Name: Date of Service: [name]. 04/03/2021 7:30 A M Medical Record Number: [id number] Patient Account Number: [id number] Date of Birth/Sex: Treating RN: January 21, 1961 (60 y.o. Male) Deon Pilling Primary Care Anitra Doxtater: Latanya Presser Other Clinician: Referring Osby Sweetin: Treating Asiya Cutbirth/Extender: Yaakov Guthrie in Treatment:  0 Vital Signs Time Taken: 07:45 Temperature (F): 98.3 Pulse (bpm): 98 Respiratory Rate (breaths/min): 20 Blood Pressure (mmHg): 115/77 Reference Range: 80 - 120 mg / dl Electronic Signature(s) Signed: 04/04/2021 5:29:58 PM By: Deon Pilling RN, BSN Entered By: Deon Pilling on 04/03/2021 07:49:25  Explain/Verbal Responses: Reinforcements needed Electronic Signature(s) Signed: 04/04/2021 5:29:58 PM By: Deon Pilling RN, BSN Entered By: Deon Pilling on 04/03/2021 08:16:39 -------------------------------------------------------------------------------- Wound Assessment Details Patient Name: Date of Service: [name], [name]. 04/03/2021 7:30 A M Medical Record Number: [id number] Patient Account Number: [id number] Date of Birth/Sex: Treating RN: April 01, 1961 (60 y.o. Male) Deon Pilling Primary Care Azusena Erlandson: Latanya Presser Other Clinician: Referring Kohl Polinsky: Treating Jaquanna Ballentine/Extender: Yaakov Guthrie in Treatment: 0 Wound Status Wound Number: 49 Primary Pressure Ulcer Etiology: Wound Location: Right Ear Wound Open Wounding Event: Pressure Injury Status: Date Acquired: 03/27/2021 Comorbid Anemia, Deep Vein Thrombosis, Colitis, Osteomyelitis, Weeks Of Treatment: 0 History: Neuropathy, Quadriplegia, Confinement Anxiety Clustered Wound: No Photos Wound Measurements Length: (cm) 1.7 Width: (cm) 0.5 Depth: (cm) 0.1 Area: (cm) 0.668 Volume: (cm) 0.067 % Reduction in Area: 0% % Reduction in Volume: 0% Epithelialization: None Tunneling: No Undermining: No Wound Description Classification: Category/Stage II Wound Margin: Distinct, outline attached Exudate Amount: Medium Exudate Type: Serosanguineous Exudate Color: red, brown Foul Odor After Cleansing: No Slough/Fibrino No Wound Bed Granulation Amount: Large (67-100%) Exposed Structure Granulation Quality: Red Fascia Exposed: No Necrotic Amount: None Present (0%) Fat Layer (Subcutaneous Tissue) Exposed: Yes Tendon Exposed: No Muscle Exposed: No Joint Exposed: No Bone Exposed: No Treatment Notes Wound #49 (Ear) Wound Laterality: Right Cleanser Soap and Water Discharge Instruction: May shower and wash wound with dial  antibacterial soap and water prior to dressing change. Wound Cleanser Discharge Instruction: Cleanse the wound with wound cleanser prior to applying a clean dressing using gauze sponges, not tissue or cotton balls. Peri-Wound Care Topical triple antibiotic ointment Discharge Instruction: apply triple antibiotic ointment Primary Dressing Secondary Dressing bandaid or foam border Discharge Instruction: apply over the triple antibiotic ointment. Secured With Compression Wrap Compression Stockings Environmental education officer) Signed: 04/03/2021 5:12:04 PM By: Baruch Gouty RN, BSN Signed: 04/04/2021 5:29:58 PM By: Deon Pilling RN, BSN Entered By: Baruch Gouty on 04/03/2021 08:08:15 -------------------------------------------------------------------------------- Wound Assessment Details Patient Name: Date of Service: [name], [name] 04/03/2021 7:30 A M Medical Record Number: [id number] Patient Account Number: [id number] Date of Birth/Sex: Treating RN: 30-May-1960 (60 y.o. Male) Deon Pilling Primary Care Elis Rawlinson: Latanya Presser Other Clinician: Referring Trayton Szabo: Treating Admir Candelas/Extender: Yaakov Guthrie in Treatment: 0 Wound Status Wound Number: 50 Primary Pressure Ulcer Etiology: Wound Location: Sacrum Wound Open Wounding Event: Pressure Injury Status: Date Acquired: 08/25/2020 Comorbid Anemia, Deep Vein Thrombosis, Colitis, Osteomyelitis, Weeks Of Treatment: 0 History: Neuropathy, Quadriplegia, Confinement Anxiety Clustered Wound: No Photos Wound Measurements Length: (cm) 4.2 Width: (cm) 2.8 Depth: (cm) 0.6 Area: (cm) 9.236 Volume: (cm) 5.542 % Reduction in Area: 0% % Reduction in Volume: 0% Epithelialization: None Tunneling: No Undermining: Yes Starting Position (o'clock): 12 Ending Position (o'clock): 6 Maximum Distance: (cm) 1 Wound Description Classification: Category/Stage III Wound Margin: Distinct, outline attached Exudate Amount:  Medium Exudate Type: Serosanguineous Exudate Color: red, brown Foul Odor After Cleansing: No Slough/Fibrino Yes Wound Bed Granulation Amount: Large (67-100%) Exposed Structure Granulation Quality: Red, Pink Fascia Exposed: No Necrotic Amount: Small (1-33%) Fat Layer (Subcutaneous Tissue) Exposed: Yes Necrotic Quality: Adherent Slough Tendon Exposed: No Muscle Exposed: No Joint Exposed: No Bone Exposed: No Treatment Notes Wound #50 (Sacrum) Cleanser Wound Cleanser Discharge Instruction: Cleanse the wound with wound cleanser prior to applying a clean dressing using gauze sponges, not tissue or cotton balls. Peri-Wound Care Skin Prep Discharge Instruction: Use skin prep as directed Zinc Oxide Ointment 30g tube Discharge Instruction: Apply Zinc Oxide as needed for any maceration to periwound with each dressing change  Explain/Verbal Responses: Reinforcements needed Electronic Signature(s) Signed: 04/04/2021 5:29:58 PM By: Deon Pilling RN, BSN Entered By: Deon Pilling on 04/03/2021 08:16:39 -------------------------------------------------------------------------------- Wound Assessment Details Patient Name: Date of Service: [name], [name]. 04/03/2021 7:30 A M Medical Record Number: [id number] Patient Account Number: [id number] Date of Birth/Sex: Treating RN: April 01, 1961 (60 y.o. Male) Deon Pilling Primary Care Azusena Erlandson: Latanya Presser Other Clinician: Referring Kohl Polinsky: Treating Jaquanna Ballentine/Extender: Yaakov Guthrie in Treatment: 0 Wound Status Wound Number: 49 Primary Pressure Ulcer Etiology: Wound Location: Right Ear Wound Open Wounding Event: Pressure Injury Status: Date Acquired: 03/27/2021 Comorbid Anemia, Deep Vein Thrombosis, Colitis, Osteomyelitis, Weeks Of Treatment: 0 History: Neuropathy, Quadriplegia, Confinement Anxiety Clustered Wound: No Photos Wound Measurements Length: (cm) 1.7 Width: (cm) 0.5 Depth: (cm) 0.1 Area: (cm) 0.668 Volume: (cm) 0.067 % Reduction in Area: 0% % Reduction in Volume: 0% Epithelialization: None Tunneling: No Undermining: No Wound Description Classification: Category/Stage II Wound Margin: Distinct, outline attached Exudate Amount: Medium Exudate Type: Serosanguineous Exudate Color: red, brown Foul Odor After Cleansing: No Slough/Fibrino No Wound Bed Granulation Amount: Large (67-100%) Exposed Structure Granulation Quality: Red Fascia Exposed: No Necrotic Amount: None Present (0%) Fat Layer (Subcutaneous Tissue) Exposed: Yes Tendon Exposed: No Muscle Exposed: No Joint Exposed: No Bone Exposed: No Treatment Notes Wound #49 (Ear) Wound Laterality: Right Cleanser Soap and Water Discharge Instruction: May shower and wash wound with dial  antibacterial soap and water prior to dressing change. Wound Cleanser Discharge Instruction: Cleanse the wound with wound cleanser prior to applying a clean dressing using gauze sponges, not tissue or cotton balls. Peri-Wound Care Topical triple antibiotic ointment Discharge Instruction: apply triple antibiotic ointment Primary Dressing Secondary Dressing bandaid or foam border Discharge Instruction: apply over the triple antibiotic ointment. Secured With Compression Wrap Compression Stockings Environmental education officer) Signed: 04/03/2021 5:12:04 PM By: Baruch Gouty RN, BSN Signed: 04/04/2021 5:29:58 PM By: Deon Pilling RN, BSN Entered By: Baruch Gouty on 04/03/2021 08:08:15 -------------------------------------------------------------------------------- Wound Assessment Details Patient Name: Date of Service: [name], [name] 04/03/2021 7:30 A M Medical Record Number: [id number] Patient Account Number: [id number] Date of Birth/Sex: Treating RN: 30-May-1960 (60 y.o. Male) Deon Pilling Primary Care Elis Rawlinson: Latanya Presser Other Clinician: Referring Trayton Szabo: Treating Admir Candelas/Extender: Yaakov Guthrie in Treatment: 0 Wound Status Wound Number: 50 Primary Pressure Ulcer Etiology: Wound Location: Sacrum Wound Open Wounding Event: Pressure Injury Status: Date Acquired: 08/25/2020 Comorbid Anemia, Deep Vein Thrombosis, Colitis, Osteomyelitis, Weeks Of Treatment: 0 History: Neuropathy, Quadriplegia, Confinement Anxiety Clustered Wound: No Photos Wound Measurements Length: (cm) 4.2 Width: (cm) 2.8 Depth: (cm) 0.6 Area: (cm) 9.236 Volume: (cm) 5.542 % Reduction in Area: 0% % Reduction in Volume: 0% Epithelialization: None Tunneling: No Undermining: Yes Starting Position (o'clock): 12 Ending Position (o'clock): 6 Maximum Distance: (cm) 1 Wound Description Classification: Category/Stage III Wound Margin: Distinct, outline attached Exudate Amount:  Medium Exudate Type: Serosanguineous Exudate Color: red, brown Foul Odor After Cleansing: No Slough/Fibrino Yes Wound Bed Granulation Amount: Large (67-100%) Exposed Structure Granulation Quality: Red, Pink Fascia Exposed: No Necrotic Amount: Small (1-33%) Fat Layer (Subcutaneous Tissue) Exposed: Yes Necrotic Quality: Adherent Slough Tendon Exposed: No Muscle Exposed: No Joint Exposed: No Bone Exposed: No Treatment Notes Wound #50 (Sacrum) Cleanser Wound Cleanser Discharge Instruction: Cleanse the wound with wound cleanser prior to applying a clean dressing using gauze sponges, not tissue or cotton balls. Peri-Wound Care Skin Prep Discharge Instruction: Use skin prep as directed Zinc Oxide Ointment 30g tube Discharge Instruction: Apply Zinc Oxide as needed for any maceration to periwound with each dressing change  Explain/Verbal Responses: Reinforcements needed Electronic Signature(s) Signed: 04/04/2021 5:29:58 PM By: Deon Pilling RN, BSN Entered By: Deon Pilling on 04/03/2021 08:16:39 -------------------------------------------------------------------------------- Wound Assessment Details Patient Name: Date of Service: [name], [name]. 04/03/2021 7:30 A M Medical Record Number: [id number] Patient Account Number: [id number] Date of Birth/Sex: Treating RN: April 01, 1961 (60 y.o. Male) Deon Pilling Primary Care Azusena Erlandson: Latanya Presser Other Clinician: Referring Kohl Polinsky: Treating Jaquanna Ballentine/Extender: Yaakov Guthrie in Treatment: 0 Wound Status Wound Number: 49 Primary Pressure Ulcer Etiology: Wound Location: Right Ear Wound Open Wounding Event: Pressure Injury Status: Date Acquired: 03/27/2021 Comorbid Anemia, Deep Vein Thrombosis, Colitis, Osteomyelitis, Weeks Of Treatment: 0 History: Neuropathy, Quadriplegia, Confinement Anxiety Clustered Wound: No Photos Wound Measurements Length: (cm) 1.7 Width: (cm) 0.5 Depth: (cm) 0.1 Area: (cm) 0.668 Volume: (cm) 0.067 % Reduction in Area: 0% % Reduction in Volume: 0% Epithelialization: None Tunneling: No Undermining: No Wound Description Classification: Category/Stage II Wound Margin: Distinct, outline attached Exudate Amount: Medium Exudate Type: Serosanguineous Exudate Color: red, brown Foul Odor After Cleansing: No Slough/Fibrino No Wound Bed Granulation Amount: Large (67-100%) Exposed Structure Granulation Quality: Red Fascia Exposed: No Necrotic Amount: None Present (0%) Fat Layer (Subcutaneous Tissue) Exposed: Yes Tendon Exposed: No Muscle Exposed: No Joint Exposed: No Bone Exposed: No Treatment Notes Wound #49 (Ear) Wound Laterality: Right Cleanser Soap and Water Discharge Instruction: May shower and wash wound with dial  antibacterial soap and water prior to dressing change. Wound Cleanser Discharge Instruction: Cleanse the wound with wound cleanser prior to applying a clean dressing using gauze sponges, not tissue or cotton balls. Peri-Wound Care Topical triple antibiotic ointment Discharge Instruction: apply triple antibiotic ointment Primary Dressing Secondary Dressing bandaid or foam border Discharge Instruction: apply over the triple antibiotic ointment. Secured With Compression Wrap Compression Stockings Environmental education officer) Signed: 04/03/2021 5:12:04 PM By: Baruch Gouty RN, BSN Signed: 04/04/2021 5:29:58 PM By: Deon Pilling RN, BSN Entered By: Baruch Gouty on 04/03/2021 08:08:15 -------------------------------------------------------------------------------- Wound Assessment Details Patient Name: Date of Service: [name], [name] 04/03/2021 7:30 A M Medical Record Number: [id number] Patient Account Number: [id number] Date of Birth/Sex: Treating RN: 30-May-1960 (60 y.o. Male) Deon Pilling Primary Care Elis Rawlinson: Latanya Presser Other Clinician: Referring Trayton Szabo: Treating Admir Candelas/Extender: Yaakov Guthrie in Treatment: 0 Wound Status Wound Number: 50 Primary Pressure Ulcer Etiology: Wound Location: Sacrum Wound Open Wounding Event: Pressure Injury Status: Date Acquired: 08/25/2020 Comorbid Anemia, Deep Vein Thrombosis, Colitis, Osteomyelitis, Weeks Of Treatment: 0 History: Neuropathy, Quadriplegia, Confinement Anxiety Clustered Wound: No Photos Wound Measurements Length: (cm) 4.2 Width: (cm) 2.8 Depth: (cm) 0.6 Area: (cm) 9.236 Volume: (cm) 5.542 % Reduction in Area: 0% % Reduction in Volume: 0% Epithelialization: None Tunneling: No Undermining: Yes Starting Position (o'clock): 12 Ending Position (o'clock): 6 Maximum Distance: (cm) 1 Wound Description Classification: Category/Stage III Wound Margin: Distinct, outline attached Exudate Amount:  Medium Exudate Type: Serosanguineous Exudate Color: red, brown Foul Odor After Cleansing: No Slough/Fibrino Yes Wound Bed Granulation Amount: Large (67-100%) Exposed Structure Granulation Quality: Red, Pink Fascia Exposed: No Necrotic Amount: Small (1-33%) Fat Layer (Subcutaneous Tissue) Exposed: Yes Necrotic Quality: Adherent Slough Tendon Exposed: No Muscle Exposed: No Joint Exposed: No Bone Exposed: No Treatment Notes Wound #50 (Sacrum) Cleanser Wound Cleanser Discharge Instruction: Cleanse the wound with wound cleanser prior to applying a clean dressing using gauze sponges, not tissue or cotton balls. Peri-Wound Care Skin Prep Discharge Instruction: Use skin prep as directed Zinc Oxide Ointment 30g tube Discharge Instruction: Apply Zinc Oxide as needed for any maceration to periwound with each dressing change  Similar devices) []  - 0 Simple Staple / Suture removal (25 or less) []  - 0 Complex Staple / Suture removal (26 or more) []  - 0 Hypo/Hyperglycemic Management (do not check if billed separately) []  - 0 Ankle / Brachial Index (ABI) - do not check if billed separately Has the patient been seen at the hospital within the last three years: Yes Total Score: 110 Level Of Care: New/Established - Level 3 Electronic Signature(s) Signed: 04/04/2021 5:29:58 PM By: Deon Pilling RN, BSN Entered By: Deon Pilling on 04/03/2021 08:37:41 -------------------------------------------------------------------------------- Encounter Discharge Information Details Patient Name: Date of Service: [name]. 04/03/2021 7:30 A M Medical Record Number: [id number] Patient Account Number: [id number] Date of Birth/Sex: Treating RN: 22-Mar-1961 (60 y.o. Male) Deon Pilling Primary Care Gennesis Hogland: Latanya Presser Other Clinician: Referring Belvin Gauss: Treating Harlan Vinal/Extender: Yaakov Guthrie in Treatment: 0 Encounter Discharge Information Items Post Procedure Vitals Discharge Condition: Stable Temperature (F): 98.3 Ambulatory Status: Wheelchair Pulse (bpm): 98 Discharge Destination: Home Respiratory Rate (breaths/min): 20 Transportation: Private Auto Blood Pressure (mmHg): 115/77 Accompanied By: caregiver Schedule Follow-up Appointment: Yes Clinical Summary of Care: Electronic Signature(s) Signed: 04/04/2021 5:29:58 PM By: Deon Pilling RN, BSN Entered By: Deon Pilling on 04/03/2021  08:38:54 -------------------------------------------------------------------------------- Lower Extremity Assessment Details Patient Name: Date of Service: [name], [name] 04/03/2021 7:30 A M Medical Record Number: [id number] Patient Account Number: [id number] Date of Birth/Sex: Treating RN: Sep 29, 1960 (60 y.o. Male) Deon Pilling Primary Care Marquies Wanat: Latanya Presser Other Clinician: Referring Leatha Rohner: Treating Marriana Hibberd/Extender: Yaakov Guthrie in Treatment: 0 Electronic Signature(s) Signed: 04/04/2021 5:29:58 PM By: Deon Pilling RN, BSN Entered By: Deon Pilling on 04/03/2021 08:09:27 -------------------------------------------------------------------------------- Multi Wound Chart Details Patient Name: Date of Service: [name]. 04/03/2021 7:30 A M Medical Record Number: [id number] Patient Account Number: [id number] Date of Birth/Sex: Treating RN: [birth year] (60 y.o. Male) Deon Pilling Primary Care Dejanay Wamboldt: Latanya Presser Other Clinician: Referring Emma Birchler: Treating Makendra Vigeant/Extender: Yaakov Guthrie in Treatment: 0 Vital Signs Height(in): Pulse(bpm): 98 Weight(lbs): Blood Pressure(mmHg): 115/77 Body Mass Index(BMI): Temperature(F): 98.3 Respiratory Rate(breaths/min): 20 Photos: [N/A:N/A] Right Ear Sacrum N/A Wound Location: Pressure Injury Pressure Injury N/A Wounding Event: Pressure Ulcer Pressure Ulcer N/A Primary Etiology: Anemia, Deep Vein Thrombosis, Anemia, Deep Vein Thrombosis, N/A Comorbid History: Colitis, Osteomyelitis, Neuropathy, Colitis, Osteomyelitis, Neuropathy, Quadriplegia, Confinement Anxiety Quadriplegia, Confinement Anxiety 03/27/2021 08/25/2020 N/A Date Acquired: 0 0 N/A Weeks of Treatment: Open Open N/A Wound Status: 1.7x0.5x0.1 4.2x2.8x0.6 N/A Measurements L x W x D (cm) 0.668 9.236 N/A A (cm) : rea 0.067 5.542 N/A Volume (cm) : 0.00% 0.00% N/A % Reduction in A rea: 0.00% 0.00% N/A % Reduction in  Volume: 12 Starting Position 1 (o'clock): 6 Ending Position 1 (o'clock): 1 Maximum Distance 1 (cm): No Yes N/A Undermining: Category/Stage II Category/Stage III N/A Classification: Medium Medium N/A Exudate A mount: Serosanguineous Serosanguineous N/A Exudate Type: red, brown red, brown N/A Exudate Color: Distinct, outline attached Distinct, outline attached N/A Wound Margin: Large (67-100%) Large (67-100%) N/A Granulation A mount: Red Red, Pink N/A Granulation Quality: None Present (0%) Small (1-33%) N/A Necrotic A mount: Fat Layer (Subcutaneous Tissue): Yes Fat Layer (Subcutaneous Tissue): Yes N/A Exposed Structures: Fascia: No Fascia: No Tendon: No Tendon: No Muscle: No Muscle: No Joint: No Joint: No Bone: No Bone: No None None N/A Epithelialization: Treatment Notes Electronic Signature(s) Signed: 04/03/2021 9:25:31 AM By: Kalman Shan DO Signed: 04/04/2021 5:29:58 PM By: Deon Pilling RN, BSN Entered By: Kalman Shan on 04/03/2021 08:37:00 -------------------------------------------------------------------------------- [place] Details Patient Name: Date of Service: Hypolite,

## 2021-04-17 ENCOUNTER — Other Ambulatory Visit: Payer: Self-pay

## 2021-04-17 ENCOUNTER — Encounter (HOSPITAL_BASED_OUTPATIENT_CLINIC_OR_DEPARTMENT_OTHER): Payer: Medicare Other | Admitting: Internal Medicine

## 2021-04-17 DIAGNOSIS — L89153 Pressure ulcer of sacral region, stage 3: Secondary | ICD-10-CM

## 2021-04-17 DIAGNOSIS — S01301A Unspecified open wound of right ear, initial encounter: Secondary | ICD-10-CM

## 2021-04-17 DIAGNOSIS — G808 Other cerebral palsy: Secondary | ICD-10-CM

## 2021-04-17 DIAGNOSIS — L03818 Cellulitis of other sites: Secondary | ICD-10-CM | POA: Diagnosis not present

## 2021-04-17 NOTE — Progress Notes (Signed)
HUE, FEEMAN ([id number]) Visit Report for 04/17/2021 Chief Complaint Document Details Patient Name: Date of Service: [name], [name] 04/17/2021 2:15 PM Medical Record Number: [id number] Patient Account Number: [id number] Date of Birth/Sex: Treating RN: 04-10-1961 (60 y.o. [name] Primary Care Provider: Latanya Presser Other Clinician: Referring Provider: Treating Provider/Extender: Wonda Amis, [name] in Treatment: 2 Information Obtained from: Patient Chief Complaint 04/03/2021: Sacral ulcer and right ear wound Electronic Signature(s) Signed: 04/17/2021 4:05:10 PM By: Kalman Shan DO Entered By: Kalman Shan on 04/17/2021 15:55:45 -------------------------------------------------------------------------------- HPI Details Patient Name: Date of Service: [name] 04/17/2021 2:15 PM Medical Record Number: [id number] Patient Account Number: [id number] Date of Birth/Sex: Treating RN: 08-27-1960 (60 y.o. 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 He states this developed while he was in the hospital in April for community- acquired pneumonia. He has been admitted to the hospital 4 times since April. On 12/06/2020 he was admitted for sepsis secondary to MRSA bacteremia from a stage IV decubitus ulcer. He had surgical debridement on 12/08/2020 by Dr. Kieth Brightly. He has been using Santyl ointment to the wound bed. He currently lives in a group home. He also has an open wound to his right ear that has been open  for the past week. He lays on his right side consistently throughout the day while watching TV. This is a wound that has waxed and waned in the past depending on how often he watches TV. He denies signs of infection to either wound site. 11/22; patient presents for 2-week follow-up. He reports that the sacral wound is more sore than usual. He currently denies fever/chills, nausea/vomiting or feeling unwell. Electronic Signature(s) Signed: 04/17/2021 4:05:10 PM By: Kalman Shan DO Entered By: Kalman Shan on 04/17/2021 15:56:36 -------------------------------------------------------------------------------- Physical Exam Details Patient Name: Date of Service: [name], [name] 04/17/2021 2:15 PM Medical Record Number: [id number] Patient Account Number: [id number] Date of Birth/Sex: Treating RN: 08/04/1960 (60 y.o. [name] Primary Care Provider: Latanya Presser Other Clinician: Referring Provider: Treating Provider/Extender: Wonda Amis, [name] in Treatment: 2 Constitutional respirations regular, non-labored and within target range for patient.Marland Kitchen Psychiatric pleasant and cooperative. Notes Sacrum: Open wound with nonviable tissue. Increased warmth and erythema to the surrounding soft tissue. Right ear: Multiple areas of inflamed skin breakdown along the helix. No obvious signs of infection to this area. Electronic Signature(s) Signed: 04/17/2021 4:05:10 PM By: Kalman Shan DO Entered By: Kalman Shan on 04/17/2021 15:57:21 -------------------------------------------------------------------------------- Physician Orders Details Patient Name: Date of Service: [name], [name] 04/17/2021 2:15 PM Medical Record Number: [id number] Patient Account Number: [id number] Date of Birth/Sex: Treating RN: [birth year] (60 y.o. Erie Noe Primary Care Provider: Latanya Presser Other Clinician: Referring Provider: Treating Provider/Extender:  Wonda Amis, [name] in Treatment: 2 Verbal / Phone Orders: No Diagnosis Coding Follow-up Appointments ppointment in 1 week. - Dr. Heber Fritch ****HOYER 60 MINS**** Return A Off-Loading Wound #49 Right Ear Low air-loss mattress (Group 2) - wound center to order group 2 specialty mattress. Turn and reposition every 2 hours Other: - ensure not to lay on right ear. Home Health Wound #50 Sacrum New wound care orders this  week; continue Home Health for wound care. May utilize formulary equivalent dressing for wound treatment orders unless otherwise specified. - [facility] home health change three times a week all other days caregivers to change. Wound Treatment Wound #49 - Ear Wound Laterality: Right Cleanser: Soap and Water (Home Health) 1 x Per Day/30 Days Discharge Instructions: May shower and wash wound with dial antibacterial soap and water prior to dressing change. Cleanser: Wound Cleanser (Home Health) 1 x Per Day/30 Days Discharge Instructions: Cleanse the wound with wound cleanser prior to applying a clean dressing using gauze sponges, not tissue or cotton balls. Topical: triple antibiotic ointment (Home Health) 1 x Per Day/30 Days Discharge Instructions: apply triple antibiotic ointment Secondary Dressing: bandaid or foam border (Home Health) 1 x Per Day/30 Days Discharge Instructions: apply over the triple antibiotic ointment. Wound #50 - Sacrum Cleanser: Wound Cleanser (Home Health) 1 x Per Day/30 Days Discharge Instructions: Cleanse the wound with wound cleanser prior to applying a clean dressing using gauze sponges, not tissue or cotton balls. Peri-Wound Care: Skin Prep (Home Health) 1 x Per Day/30 Days Discharge Instructions: Use skin prep as directed Peri-Wound Care: Zinc Oxide Ointment 30g tube (Home Health) 1 x Per Day/30 Days Discharge Instructions: Apply Zinc Oxide as needed for any maceration to periwound with each dressing change Prim Dressing: Dakin's  Solution 0.25%, 16 (oz) (Home Health) 1 x Per Day/30 Days ary Discharge Instructions: Moisten gauze with Dakin's solution Secondary Dressing: Zetuvit Plus Silicone Border Dressing 5x5 (in/in) (Home Health) 1 x Per Day/30 Days Discharge Instructions: Apply silicone border over primary dressing as directed. Radiology X-ray, coccyx - for sacral wound getting worse; hot to touch and red periwound. Patient Medications llergies: adhesive tape A Notifications Medication Indication Start End 04/17/2021 Dakin's Solution DOSE 1 - miscellaneous 0.125 % solution - wet to dry dressings twice daily 04/17/2021 Augmentin DOSE 1 - oral 875 mg-125 mg tablet - 1 tablet oral BID x 10 days Electronic Signature(s) Signed: 04/17/2021 4:02:18 PM By: Kalman Shan DO Entered By: Kalman Shan on 04/17/2021 16:02:17 Prescription 04/17/2021 -------------------------------------------------------------------------------- Rollison, Akshith L. Kalman Shan DO Patient Name: Provider: April 11, 1961 [id number] Date of Birth: NPI#: [name] [id number] Sex: DEA #: [phone] Phone #: License #: [place] Patient Address: [address] [place], [place] [address] [place], [place] [address] [phone] Allergies adhesive tape Provider's Orders X-ray, coccyx - for sacral wound getting worse; hot to touch and red periwound. Hand Signature: Date(s): Electronic Signature(s) Signed: 04/17/2021 4:05:10 PM By: Kalman Shan DO Entered By: Kalman Shan on 04/17/2021 16:02:19 -------------------------------------------------------------------------------- Problem List Details Patient Name: Date of Service: [name] 04/17/2021 2:15 PM Medical Record Number: [id number] Patient Account Number: [id number] Date of Birth/Sex: Treating RN: [birth year] (60 y.o. [name] Primary Care Provider: Latanya Presser Other  Clinician: Referring Provider: Treating Provider/Extender: Wonda Amis, [name] in Treatment: 2 Active Problems ICD-10 Encounter Code Description Active Date MDM Diagnosis L89.153 Pressure ulcer of sacral region, stage 3 04/03/2021 No Yes S01.301A Unspecified open wound of right ear, initial encounter 04/03/2021 No Yes G80.8 Other cerebral palsy 04/03/2021 No Yes F79 Unspecified intellectual disabilities 04/03/2021 No Yes Z89.511 Acquired absence of right leg below knee 04/03/2021 No Yes Inactive Problems Resolved Problems Electronic Signature(s) Signed: 04/17/2021 4:05:10 PM By: Kalman Shan DO Entered By: Kalman Shan on 04/17/2021 15:54:44 -------------------------------------------------------------------------------- Progress Note Details Patient Name: Date of Service: Mcomber, Bertram Gala 04/17/2021 2:15 PM Medical Record Number: [id number] Patient  Account Number: [id number] Date of Birth/Sex: Treating RN: 1961/02/08 (60 y.o. [name] Primary Care Provider: Latanya Presser Other Clinician: Referring Provider: Treating Provider/Extender: Wonda Amis, [name] in Treatment: 2 Subjective Chief Complaint Information obtained from Patient 04/03/2021: Sacral ulcer and right ear wound History of Present Illness (HPI) Admission 04/03/2021 Mr. 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 He states this developed while he was in the hospital in April for community- acquired pneumonia. He has been admitted to the hospital 4 times since April. On 12/06/2020 he was admitted for sepsis secondary to MRSA bacteremia from a stage IV decubitus ulcer. He had surgical debridement on 12/08/2020 by Dr. Kieth Brightly. He has been using Santyl ointment to the wound  bed. He currently lives in a group home. He also has an open wound to his right ear that has been open for the past week. He lays on his right side consistently throughout the day while watching TV. This is a wound that has waxed and waned in the past depending on how often he watches TV. He denies signs of infection to either wound site. 11/22; patient presents for 2-week follow-up. He reports that the sacral wound is more sore than usual. He currently denies fever/chills, nausea/vomiting or feeling unwell. Patient History Information obtained from Patient. Family History Unknown History. Social History Never smoker, Marital Status - Single, Alcohol Use - Never, Drug Use - No History, Caffeine Use - Moderate. Medical History Eyes Denies history of Cataracts, Glaucoma, Optic Neuritis Ear/Nose/Mouth/Throat Denies history of Chronic sinus problems/congestion, Middle ear problems Hematologic/Lymphatic Patient has history of Anemia - microcytic Respiratory Denies history of Aspiration, Asthma, Chronic Obstructive Pulmonary Disease (COPD), Pneumothorax, Sleep Apnea, Tuberculosis Cardiovascular Patient has history of Deep Vein Thrombosis Denies history of Angina, Arrhythmia, Congestive Heart Failure, Coronary Artery Disease, Hypertension, Hypotension, Myocardial Infarction, Peripheral Arterial Disease, Peripheral Venous Disease, Phlebitis, Vasculitis Gastrointestinal Patient has history of Colitis Denies history of Cirrhosis , Crohnoos, Hepatitis A, Hepatitis B, Hepatitis C Endocrine Denies history of Type I Diabetes, Type II Diabetes Genitourinary Denies history of End Stage Renal Disease Immunological Denies history of Lupus Erythematosus, Raynaudoos, Scleroderma Integumentary (Skin) Denies history of History of Burn Musculoskeletal Patient has history of Osteomyelitis Neurologic Patient has history of Neuropathy, Quadriplegia - spastic Oncologic Denies history of Received  Chemotherapy, Received Radiation Psychiatric Patient has history of Confinement Anxiety Denies history of Anorexia/bulimia Hospitalization/Surgery History - surgery to straughten legs. 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 - hiatal hernia repair. - (R) gt toe amputation. - Constipation. 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 - Pneumonia. - left BKA 3//07/2020. - PNA 07/2020. - SNF from inpatient with PNA till 02/24/2021. Medical A Surgical History Notes nd Co-Morbid Conditions h/o spiral fracture distal (L) tibia , h/o shingles , MR , infantile CP Ear/Nose/Mouth/Throat hiatal hernia Respiratory h/o pneumonia , chronic bronchitis Cardiovascular hypercholesterolemia Gastrointestinal acute gastric ulcer with hemorrhage , constipation , GERD , Abdominal Distention Genitourinary incontinence (on Detrol) Renal Glycosuria Integumentary (Skin) Eczema Acne Vulgaris Onychomycosis Pressure Ulcers Musculoskeletal DJD of shoulder Neurologic cerebral palsy Psychiatric general anxiety , bipolar , depression, Mental Retardation Objective Constitutional respirations regular, non-labored and within target range for patient.. Vitals Time Taken: 2:34 PM, Temperature: 99.4 F, Pulse: 98 bpm, Respiratory Rate: 17 breaths/min, Blood Pressure: 122/88 mmHg. Psychiatric pleasant and cooperative. General Notes: Sacrum:  Open wound with nonviable tissue. Increased warmth and erythema to the surrounding soft tissue. Right ear: Multiple areas of inflamed skin breakdown along the helix. No obvious signs of infection to this area. Integumentary (Hair, Skin) Wound #49 status is Open. Original cause of wound was Pressure Injury. The date acquired was: 03/27/2021. The wound has been in treatment 2 weeks. The wound is located on the Right Ear. The wound measures 0.1cm length x 0.1cm width x 0.1cm depth; 0.008cm^2 area and 0.001cm^3 volume. There is Fat Layer (Subcutaneous Tissue) exposed. There is no tunneling or undermining  noted. There is a medium amount of serosanguineous drainage noted. The wound margin is distinct with the outline attached to the wound base. There is large (67-100%) red granulation within the wound bed. There is no necrotic tissue within the wound bed. Wound #50 status is Open. Original cause of wound was Pressure Injury. The date acquired was: 08/25/2020. The wound has been in treatment 2 weeks. The wound is located on the Sacrum. The wound measures 4.4cm length x 2.5cm width x 1cm depth; 8.639cm^2 area and 8.639cm^3 volume. There is Fat Layer (Subcutaneous Tissue) exposed. There is no tunneling noted, however, there is undermining starting at 12:00 and ending at 6:00 with a maximum distance of 1.5cm. There is a medium amount of serosanguineous drainage noted. The wound margin is distinct with the outline attached to the wound base. There is large (67-100%) red, pink granulation within the wound bed. There is a small (1-33%) amount of necrotic tissue within the wound bed including Adherent Slough. Assessment Active Problems ICD-10 Pressure ulcer of sacral region, stage 3 Unspecified open wound of right ear, initial encounter Other cerebral palsy Unspecified intellectual disabilities Acquired absence of right leg below knee Patients sacral wound is stable in size. He has been using Santyl to the wound bed. The surrounding tissue is erythematous and warm to the touch. The area appears infected. I will start him on Augmentin. I would also like to obtain an x-ray of the sacrum. I recommended Dakin's wet-to-dry dressings to this area. His right ear wound is stable. I recommended continuing antibiotic ointment and offloading to this area. Plan Follow-up Appointments: Return Appointment in 1 week. - Dr. Heber [place] ****HOYER 60 MINS**** Off-Loading: Wound #49 Right Ear: Low air-loss mattress (Group 2) - wound center to order group 2 specialty mattress. Turn and reposition every 2 hours Other: - ensure  not to lay on right ear. Home Health: Wound #50 Sacrum: New wound care orders this week; continue Home Health for wound care. May utilize formulary equivalent dressing for wound treatment orders unless otherwise specified. - [facility] home health change three times a week all other days caregivers to change. Radiology ordered were: X-ray, coccyx - for sacral wound getting worse; hot to touch and red periwound. The following medication(s) was prescribed: Dakin's Solution miscellaneous 0.125 % solution 1 wet to dry dressings twice daily starting 04/17/2021 Augmentin oral 875 mg-125 mg tablet 1 1 tablet oral BID x 10 days starting 04/17/2021 WOUND #49: - Ear Wound Laterality: Right Cleanser: Soap and Water (Home Health) 1 x Per Day/30 Days Discharge Instructions: May shower and wash wound with dial antibacterial soap and water prior to dressing change. Cleanser: Wound Cleanser (Home Health) 1 x Per Day/30 Days Discharge Instructions: Cleanse the wound with wound cleanser prior to applying a clean dressing using gauze sponges, not tissue or cotton balls. Topical: triple antibiotic ointment (Home Health) 1 x Per Day/30 Days Discharge Instructions: apply triple antibiotic ointment  Secondary Dressing: bandaid or foam border (Home Health) 1 x Per Day/30 Days Discharge Instructions: apply over the triple antibiotic ointment. WOUND #50: - Sacrum Wound Laterality: Cleanser: Wound Cleanser (Home Health) 1 x Per Day/30 Days Discharge Instructions: Cleanse the wound with wound cleanser prior to applying a clean dressing using gauze sponges, not tissue or cotton balls. Peri-Wound Care: Skin Prep (Home Health) 1 x Per Day/30 Days Discharge Instructions: Use skin prep as directed Peri-Wound Care: Zinc Oxide Ointment 30g tube (Home Health) 1 x Per Day/30 Days Discharge Instructions: Apply Zinc Oxide as needed for any maceration to periwound with each dressing change Prim Dressing: Dakin's Solution 0.25%, 16  (oz) (Home Health) 1 x Per Day/30 Days ary Discharge Instructions: Moisten gauze with Dakin's solution Secondary Dressing: Zetuvit Plus Silicone Border Dressing 5x5 (in/in) (Home Health) 1 x Per Day/30 Days Discharge Instructions: Apply silicone border over primary dressing as directed. 1. Dakin's wet-to-dry 2. X-ray of sacrum/coccyx 3. Antibiotic ointment to the ear 4. Aggressive offloading 5. Augmentin 6. Follow-up in 1 week Electronic Signature(s) Signed: 04/17/2021 4:05:10 PM By: Kalman Shan DO Entered By: Kalman Shan on 04/17/2021 16:04:20 -------------------------------------------------------------------------------- HxROS Details Patient Name: Date of Service: Petrucci, Bertram Gala. 04/17/2021 2:15 PM Medical Record Number: [id number] Patient Account Number: [id number] Date of Birth/Sex: Treating RN: 24-Nov-1960 (60 y.o. [name] Primary Care Provider: Latanya Presser Other Clinician: Referring Provider: Treating Provider/Extender: Wonda Amis, [name] in Treatment: 2 Information Obtained From Patient Co-Morbid Conditions Medical History: Past Medical History Notes: h/o spiral fracture distal (L) tibia , h/o shingles , MR , infantile CP Eyes Medical History: Negative for: Cataracts; Glaucoma; Optic Neuritis Ear/Nose/Mouth/Throat Medical History: Negative for: Chronic sinus problems/congestion; Middle ear problems Past Medical History Notes: hiatal hernia Hematologic/Lymphatic Medical History: Positive for: Anemia - microcytic Respiratory Medical History: Negative for: Aspiration; Asthma; Chronic Obstructive Pulmonary Disease (COPD); Pneumothorax; Sleep Apnea; Tuberculosis Past Medical History Notes: h/o pneumonia , chronic bronchitis Cardiovascular Medical History: Positive for: Deep Vein Thrombosis Negative for: Angina; Arrhythmia; Congestive Heart Failure; Coronary Artery Disease; Hypertension; Hypotension; Myocardial  Infarction; Peripheral Arterial Disease; Peripheral Venous Disease; Phlebitis; Vasculitis Past Medical History Notes: hypercholesterolemia Gastrointestinal Medical History: Positive for: Colitis Negative for: Cirrhosis ; Crohns; Hepatitis A; Hepatitis B; Hepatitis C Past Medical History Notes: acute gastric ulcer with hemorrhage , constipation , GERD , Abdominal Distention Endocrine Medical History: Negative for: Type I Diabetes; Type II Diabetes Genitourinary Medical History: Negative for: End Stage Renal Disease Past Medical History Notes: incontinence (on Detrol) Renal Glycosuria Immunological Medical History: Negative for: Lupus Erythematosus; Raynauds; Scleroderma Integumentary (Skin) Medical History: Negative for: History of Burn Past Medical History Notes: Eczema Acne Vulgaris Onychomycosis Pressure Ulcers Musculoskeletal Medical History: Positive for: Osteomyelitis Past Medical History Notes: DJD of shoulder Neurologic Medical History: Positive for: Neuropathy; Quadriplegia - spastic Past Medical History Notes: cerebral palsy Oncologic Medical History: Negative for: Received Chemotherapy; Received Radiation Psychiatric Medical History: Positive for: Confinement Anxiety Negative for: Anorexia/bulimia Past Medical History Notes: general anxiety , bipolar , depression, Mental Retardation Immunizations Pneumococcal Vaccine: Received Pneumococcal Vaccination: Yes Received Pneumococcal Vaccination On or After 60th Birthday: Yes Implantable Devices None Hospitalization / Surgery History Type of Hospitalization/Surgery surgery to straughten legs 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) hiatal hernia repair (R) gt toe amputation Constipation 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 Pneumonia left BKA 3//07/2020 PNA 07/2020 SNF from inpatient with PNA till 02/24/2021 Family and Social History Unknown History: Yes; Never smoker; Marital Status - Single; Alcohol Use: Never; Drug  Use: No History;  Caffeine Use: Moderate; Financial Concerns: No; Food, Clothing or Shelter Needs: No; Support System Lacking: No; Transportation Concerns: No Electronic Signature(s) Signed: 04/17/2021 4:05:10 PM By: Geralyn Corwin DO Signed: 04/17/2021 5:20:05 PM By: Shawn Stall RN, BSN Entered By: Geralyn Corwin on 04/17/2021 15:56:44 -------------------------------------------------------------------------------- SuperBill Details Patient Name: Date of Service: LIBORIO, SACCENTE 04/17/2021 Medical Record Number: [id number] Patient Account Number: [id number] Date of Birth/Sex: Treating RN: 12/25/1960 (60 y.o. [name] Primary Care Provider: [name] Other Clinician: Referring Provider: Treating Provider/Extender: Estill Bakes, [name] in Treatment: 2 Diagnosis Coding ICD-10 Codes Code Description [phone] Pressure ulcer of sacral region, stage 3 S01.301A Unspecified open wound of right ear, initial encounter G80.8 Other cerebral palsy F79 Unspecified intellectual disabilities Z89.511 Acquired absence of right leg below knee L03.818 Cellulitis of other sites Facility Procedures CPT4 Code: [id number] Description: [phone] - WOUND CARE VISIT-LEV 5 EST PT Modifier: Quantity: 1 Physician Procedures : CPT4 Code Description Modifier [id number] - WC PHYS LEVEL 4 - EST PT ICD-10 Diagnosis Description L89.153 Pressure ulcer of sacral region, stage 3 S01.301A Unspecified open wound of right ear, initial encounter G80.8 Other cerebral palsy L03.818  Cellulitis of other sites Quantity: 1 Electronic Signature(s) Signed: 04/17/2021 4:05:10 PM By: Geralyn Corwin DO Entered By: Geralyn Corwin on 04/17/2021 16:04:51

## 2021-04-18 NOTE — Progress Notes (Signed)
Management and Medication Current Pain Management: Medication: No Cold Application: No Rest: No Massage: No Activity: No T.E.N.S.: No Heat Application: No Leg drop or elevation: No Is the Current Pain Management Adequate: Adequate How does your wound impact your activities of daily livingo Sleep: No Bathing: No Appetite: No Relationship With Others: No Bladder Continence:  No Emotions: No Bowel Continence: No Work: No Toileting: No Drive: No Dressing: No Hobbies: No Electronic Signature(s) Signed: 04/18/2021 3:00:15 PM By: Rhae Hammock RN Entered By: Rhae Hammock on 04/17/2021 14:33:53 -------------------------------------------------------------------------------- Patient/Caregiver Education Details Patient Name: Date of Service: [name] 11/22/2022andnbsp2:15 PM Medical Record Number: [id number] Patient Account Number: [id number] Date of Birth/Gender: Treating RN: [birth year] (60 y.o. [name] Primary Care Physician: Latanya Presser Other Clinician: Referring Physician: Treating Physician/Extender: Jones Skene Weeks in Treatment: 2 Education Assessment Education Provided To: Patient Education Topics Provided Pain: Methods: Explain/Verbal Responses: State content correctly Safety: Methods: Explain/Verbal Responses: State content correctly Prien: o Methods: Explain/Verbal Responses: State content correctly Wound/Skin Impairment: Methods: Explain/Verbal Responses: State content correctly Electronic Signature(s) Signed: 04/18/2021 3:00:15 PM By: Rhae Hammock RN Entered By: Rhae Hammock on 04/17/2021 14:49:39 -------------------------------------------------------------------------------- Wound Assessment Details Patient Name: Date of Service: [name] 04/17/2021 2:15 PM Medical Record Number: [id number] Patient Account Number: [id number] Date of Birth/Sex: Treating RN: 26-Jan-1961 (60 y.o. [name], [name] Primary Care [place]: Latanya Presser Other Clinician: Referring Creig Landin: Treating Saniya Tranchina/Extender: Wonda Amis, [name] in Treatment: 2 Wound Status Wound Number: 49 Primary Pressure Ulcer Etiology: Wound Location: Right Ear Wound Open Wounding Event: Pressure Injury Status: Date Acquired:  03/27/2021 Date Acquired: 03/27/2021 Comorbid Anemia, Deep Vein Thrombosis, Colitis, Osteomyelitis, Weeks Of Treatment: 2 History: Neuropathy, Quadriplegia, Confinement Anxiety Clustered Wound: No Wound Measurements Length: (cm) 0.1 Width: (cm) 0.1 Depth: (cm) 0.1 Area: (cm) 0.008 Volume: (cm) 0.001 % Reduction in Area: 98.8% % Reduction in Volume: 98.5% Epithelialization: None Tunneling: No Undermining: No Wound Description Classification: Category/Stage II Wound Margin: Distinct, outline attached Exudate Amount: Medium Exudate Type: Serosanguineous Exudate Color: red, brown Foul Odor After Cleansing: No Slough/Fibrino No Wound Bed Granulation Amount: Large (67-100%) Exposed Structure Granulation Quality: Red Fascia Exposed: No Necrotic Amount: None Present (0%) Fat Layer (Subcutaneous Tissue) Exposed: Yes Tendon Exposed: No Muscle Exposed: No Joint Exposed: No Bone Exposed: No Treatment Notes Wound #49 (Ear) Wound Laterality: Right Cleanser Soap and Water Discharge Instruction: May shower and wash wound with dial antibacterial soap and water prior to dressing change. Wound Cleanser Discharge Instruction: Cleanse the wound with wound cleanser prior to applying a clean dressing using gauze sponges, not tissue or cotton balls. Peri-Wound Care Topical triple antibiotic ointment Discharge Instruction: apply triple antibiotic ointment Primary Dressing Secondary Dressing bandaid or foam border Discharge Instruction: apply over the triple antibiotic ointment. Secured With Compression Wrap Compression Stockings Environmental education officer) Signed: 04/18/2021 3:00:15 PM By: Rhae Hammock RN Entered By: Rhae Hammock on 04/17/2021 15:22:59 -------------------------------------------------------------------------------- Wound Assessment Details Patient Name: Date of Service: [name], [name] 04/17/2021 2:15 PM Medical Record Number: [id number] Patient  Account Number: [id number] Date of Birth/Sex: Treating RN: 05/18/1961 (60 y.o. [name] Primary Care Rayelynn Loyal: Latanya Presser Other Clinician: Referring Sonda Coppens: Treating Ginny Loomer/Extender: Wonda Amis, [name] in Treatment: 2 Wound Status Wound Number: 50 Primary Pressure Ulcer Etiology: Wound Location: Sacrum Wound Open Wounding Event: Pressure Injury Status: Date Acquired: 08/25/2020 Comorbid Anemia, Deep Vein Thrombosis, Colitis, Osteomyelitis, Weeks Of Treatment: 2 History: Neuropathy, Quadriplegia, Confinement Anxiety Clustered Wound: No Photos  Wound Imaging (photographs - any number of wounds) []  - 0 Wound Tracing (instead of photographs) []  - 0 Simple Wound Measurement - one wound X- 2 5 Complex Wound Measurement - multiple wounds INTERVENTIONS - Wound Dressings []  - 0 Small Wound Dressing one or multiple wounds X- 22 15 Medium Wound Dressing one or multiple wounds []  - 0 Large Wound Dressing one or multiple wounds []  - 0 Application of Medications - topical []  - 0 Application of Medications - injection INTERVENTIONS - Miscellaneous []  - 0 External ear exam []  - 0 Specimen Collection (cultures, biopsies, blood, body fluids, etc.) []  - 0 Specimen(s) / Culture(s) sent or taken to Lab for analysis X- 1 10 Patient Transfer (multiple staff / [employer] Service fast streamer / Similar devices) []  - 0 Simple Staple / Suture removal (25 or less) []  - 0 Complex Staple / Suture removal (26 or more) []  - 0 Hypo / Hyperglycemic Management (close monitor of Blood Glucose) []  - 0 Ankle / Brachial Index (ABI) - do not check if billed separately X- 1 5 Vital Signs Has the patient been seen at the hospital within the last three years: Yes Total Score: 440 Level Of Care: New/Established - Level 5 Electronic Signature(s) Signed: 04/18/2021 3:00:15 PM By: Rhae Hammock RN Entered By: Rhae Hammock on 04/17/2021 16:01:16 -------------------------------------------------------------------------------- Encounter Discharge Information Details Patient Name: Date of Service: [name] 04/17/2021 2:15 PM Medical Record Number: [id number] Patient Account Number: [id number] Date of Birth/Sex: Treating RN: 19-Nov-1960 (60 y.o. [name] Primary Care Berel Najjar: Latanya Presser Other Clinician: Referring  Lando Alcalde: Treating Xaden Kaufman/Extender: Wonda Amis, [name] in Treatment: 2 Encounter Discharge Information Items Discharge Condition: Stable Ambulatory Status: Wheelchair Discharge Destination: Home Transportation: Private Auto Accompanied By: caregiveres Schedule Follow-up Appointment: Yes Clinical Summary of Care: Patient Declined Electronic Signature(s) Signed: 04/18/2021 3:00:15 PM By: Rhae Hammock RN Entered By: Rhae Hammock on 04/17/2021 16:02:30 -------------------------------------------------------------------------------- Lower Extremity Assessment Details Patient Name: Date of Service: [name], [name] 04/17/2021 2:15 PM Medical Record Number: [id number] Patient Account Number: [id number] Date of Birth/Sex: Treating RN: August 03, 1960 (60 y.o. [name] Primary Care Keidrick Murty: Latanya Presser Other Clinician: Referring Adilee Lemme: Treating Miliani Deike/Extender: Wonda Amis, [name] in Treatment: 2 Electronic Signature(s) Signed: 04/18/2021 3:00:15 PM By: Rhae Hammock RN Entered By: Rhae Hammock on 04/17/2021 14:34:05 -------------------------------------------------------------------------------- Multi Wound Chart Details Patient Name: Date of Service: [name] 04/17/2021 2:15 PM Medical Record Number: [id number] Patient Account Number: [id number] Date of Birth/Sex: Treating RN: [birth year] (60 y.o. [name] Primary Care Moyses Pavey: Latanya Presser Other Clinician: Referring Karsen Nakanishi: Treating Idrees Quam/Extender: Wonda Amis, [name] in Treatment: 2 Vital Signs Height(in): Pulse(bpm): 1 Weight(lbs): Blood Pressure(mmHg): 122/88 Body Mass Index(BMI): Temperature(F): 99.4 Respiratory Rate(breaths/min): 17 Photos: [49:No Photos] [N/A:N/A] Right Ear Sacrum N/A Wound Location: Pressure Injury Pressure Injury N/A Wounding Event: Pressure Ulcer Pressure Ulcer  N/A Primary Etiology: Anemia, Deep Vein Thrombosis, Anemia, Deep Vein Thrombosis, N/A Comorbid History: Colitis, Osteomyelitis, Neuropathy, Colitis, Osteomyelitis, Neuropathy, Quadriplegia, Confinement Anxiety Quadriplegia, Confinement Anxiety 03/27/2021 08/25/2020 N/A Date Acquired: 2 2 N/A Weeks of Treatment: Open Open N/A Wound Status: 0.1x0.1x0.1 4.4x2.5x1 N/A Measurements L x W x D (cm) 0.008 8.639 N/A A (cm) : rea 0.001 8.639 N/A Volume (cm) : 98.80% 6.50% N/A % Reduction in A rea: 98.50% -55.90% N/A % Reduction in Volume: 12 Starting Position 1 (o'clock): 6 Ending Position 1 (o'clock): 1.5 Maximum Distance 1 (cm): No Yes N/A Undermining: Category/Stage II Category/Stage III N/A Classification: Medium Medium N/A Exudate A  Wound Imaging (photographs - any number of wounds) []  - 0 Wound Tracing (instead of photographs) []  - 0 Simple Wound Measurement - one wound X- 2 5 Complex Wound Measurement - multiple wounds INTERVENTIONS - Wound Dressings []  - 0 Small Wound Dressing one or multiple wounds X- 22 15 Medium Wound Dressing one or multiple wounds []  - 0 Large Wound Dressing one or multiple wounds []  - 0 Application of Medications - topical []  - 0 Application of Medications - injection INTERVENTIONS - Miscellaneous []  - 0 External ear exam []  - 0 Specimen Collection (cultures, biopsies, blood, body fluids, etc.) []  - 0 Specimen(s) / Culture(s) sent or taken to Lab for analysis X- 1 10 Patient Transfer (multiple staff / Civil Service fast streamer / Similar devices) []  - 0 Simple Staple / Suture removal (25 or less) []  - 0 Complex Staple / Suture removal (26 or more) []  - 0 Hypo / Hyperglycemic Management (close monitor of Blood Glucose) []  - 0 Ankle / Brachial Index (ABI) - do not check if billed separately X- 1 5 Vital Signs Has the patient been seen at the hospital within the last three years: Yes Total Score: 440 Level Of Care: New/Established - Level 5 Electronic Signature(s) Signed: 04/18/2021 3:00:15 PM By: Rhae Hammock RN Entered By: Rhae Hammock on 04/17/2021 16:01:16 -------------------------------------------------------------------------------- Encounter Discharge Information Details Patient Name: Date of Service: [name] 04/17/2021 2:15 PM Medical Record Number: [id number] Patient Account Number: [id number] Date of Birth/Sex: Treating RN: 19-Nov-1960 (60 y.o. [name] Primary Care Berel Najjar: Latanya Presser Other Clinician: Referring  Lando Alcalde: Treating Xaden Kaufman/Extender: Wonda Amis, [name] in Treatment: 2 Encounter Discharge Information Items Discharge Condition: Stable Ambulatory Status: Wheelchair Discharge Destination: Home Transportation: Private Auto Accompanied By: caregiveres Schedule Follow-up Appointment: Yes Clinical Summary of Care: Patient Declined Electronic Signature(s) Signed: 04/18/2021 3:00:15 PM By: Rhae Hammock RN Entered By: Rhae Hammock on 04/17/2021 16:02:30 -------------------------------------------------------------------------------- Lower Extremity Assessment Details Patient Name: Date of Service: [name], [name] 04/17/2021 2:15 PM Medical Record Number: [id number] Patient Account Number: [id number] Date of Birth/Sex: Treating RN: August 03, 1960 (60 y.o. [name] Primary Care Keidrick Murty: Latanya Presser Other Clinician: Referring Adilee Lemme: Treating Miliani Deike/Extender: Wonda Amis, [name] in Treatment: 2 Electronic Signature(s) Signed: 04/18/2021 3:00:15 PM By: Rhae Hammock RN Entered By: Rhae Hammock on 04/17/2021 14:34:05 -------------------------------------------------------------------------------- Multi Wound Chart Details Patient Name: Date of Service: [name] 04/17/2021 2:15 PM Medical Record Number: [id number] Patient Account Number: [id number] Date of Birth/Sex: Treating RN: [birth year] (60 y.o. [name] Primary Care Moyses Pavey: Latanya Presser Other Clinician: Referring Karsen Nakanishi: Treating Idrees Quam/Extender: Wonda Amis, [name] in Treatment: 2 Vital Signs Height(in): Pulse(bpm): 1 Weight(lbs): Blood Pressure(mmHg): 122/88 Body Mass Index(BMI): Temperature(F): 99.4 Respiratory Rate(breaths/min): 17 Photos: [49:No Photos] [N/A:N/A] Right Ear Sacrum N/A Wound Location: Pressure Injury Pressure Injury N/A Wounding Event: Pressure Ulcer Pressure Ulcer  N/A Primary Etiology: Anemia, Deep Vein Thrombosis, Anemia, Deep Vein Thrombosis, N/A Comorbid History: Colitis, Osteomyelitis, Neuropathy, Colitis, Osteomyelitis, Neuropathy, Quadriplegia, Confinement Anxiety Quadriplegia, Confinement Anxiety 03/27/2021 08/25/2020 N/A Date Acquired: 2 2 N/A Weeks of Treatment: Open Open N/A Wound Status: 0.1x0.1x0.1 4.4x2.5x1 N/A Measurements L x W x D (cm) 0.008 8.639 N/A A (cm) : rea 0.001 8.639 N/A Volume (cm) : 98.80% 6.50% N/A % Reduction in A rea: 98.50% -55.90% N/A % Reduction in Volume: 12 Starting Position 1 (o'clock): 6 Ending Position 1 (o'clock): 1.5 Maximum Distance 1 (cm): No Yes N/A Undermining: Category/Stage II Category/Stage III N/A Classification: Medium Medium N/A Exudate A  mount: Serosanguineous Serosanguineous N/A Exudate Type: red, brown red, brown N/A Exudate Color: Distinct, outline attached Distinct, outline attached N/A Wound Margin: Large (67-100%) Large (67-100%) N/A Granulation A mount: Red Red, Pink N/A Granulation Quality: None Present (0%) Small (1-33%) N/A Necrotic A mount: Fat Layer (Subcutaneous Tissue): Yes Fat Layer (Subcutaneous Tissue): Yes N/A Exposed Structures: Fascia: No Fascia: No Tendon: No Tendon: No Muscle: No Muscle: No Joint: No Joint: No Bone: No Bone: No None None N/A Epithelialization: Treatment Notes Electronic Signature(s) Signed: 04/17/2021 4:05:10 PM By: Kalman Shan DO Signed: 04/17/2021 5:20:05 PM By: Deon Pilling RN, BSN Entered By: Kalman Shan on 04/17/2021 15:55:34 -------------------------------------------------------------------------------- Multi-Disciplinary Care Plan Details Patient Name: Date of Service: [name], [name] 04/17/2021 2:15 PM Medical Record Number: [id number] Patient Account Number: [id number] Date of Birth/Sex: Treating RN: [birth year] (60 y.o. [name], [name] Primary Care Tailer Volkert: Other Clinician: Latanya Presser Referring Arron Mcnaught: Treating Akil Hoos/Extender: Wonda Amis, [name] in Treatment: 2 Active Inactive Abuse / Safety / Falls / Self Care Management Nursing Diagnoses: Potential for falls Potential for injury related to falls Goals: Patient will remain injury free related to falls Date Initiated: 04/03/2021 Target Resolution Date: 05/11/2021 Goal Status: Active Patient/caregiver will verbalize understanding of skin care regimen Date Initiated: 04/03/2021 Target Resolution Date: 05/04/2021 Goal Status: Active Interventions: Assess fall risk on admission and as needed Assess: immobility, friction, shearing, incontinence upon admission and as needed Assess impairment of mobility on admission and as needed per policy Provide education on fall prevention Notes: Orientation to the Wound Care Program Nursing Diagnoses: Knowledge deficit related to the wound healing center program Goals: Patient/caregiver will verbalize understanding of the [place] Program Date Initiated: 04/03/2021 Target Resolution Date: 05/04/2021 Goal Status: Active Interventions: Provide education on orientation to the wound center Notes: Pain, Acute or Chronic Nursing Diagnoses: Pain, acute or chronic: actual or potential Potential alteration in comfort, pain Goals: Patient will verbalize adequate pain control and receive pain control interventions during procedures as needed Date Initiated: 04/03/2021 Target Resolution Date: 05/03/2021 Goal Status: Active Patient/caregiver will verbalize comfort level met Date Initiated: 04/03/2021 Target Resolution Date: 05/03/2021 Goal Status: Active Interventions: Encourage patient to take pain medications as prescribed Provide education on pain management Reposition patient for comfort Treatment Activities: Administer pain control measures as ordered : 04/03/2021 Notes: Wound/Skin Impairment Nursing Diagnoses: Knowledge deficit  related to ulceration/compromised skin integrity Goals: Patient/caregiver will verbalize understanding of skin care regimen Date Initiated: 04/03/2021 Target Resolution Date: 05/03/2021 Goal Status: Active Interventions: Assess patient/caregiver ability to perform ulcer/skin care regimen upon admission and as needed Assess ulceration(s) every visit Provide education on ulcer and skin care Treatment Activities: Skin care regimen initiated : 04/03/2021 Topical wound management initiated : 04/03/2021 Notes: Electronic Signature(s) Signed: 04/18/2021 3:00:15 PM By: Rhae Hammock RN Entered By: Rhae Hammock on 04/17/2021 14:49:01 -------------------------------------------------------------------------------- Pain Assessment Details Patient Name: Date of Service: [name], [name] 04/17/2021 2:15 PM Medical Record Number: [id number] Patient Account Number: [id number] Date of Birth/Sex: Treating RN: [birth year] (60 y.o. [name] Primary Care Jamyah Folk: Latanya Presser Other Clinician: Referring Rexine Gowens: Treating Tavita Eastham/Extender: Wonda Amis, [name] in Treatment: 2 Active Problems Location of Pain Severity and Description of Pain Patient Has Paino Yes Site Locations Pain Location: Generalized Pain, Pain in Ulcers With Dressing Change: Yes Duration of the Pain. Constant / Intermittento Intermittent Rate the pain. Current Pain Level: 10 Worst Pain Level: 10 Least Pain Level: 0 Tolerable Pain Level: 10 Character of Pain Describe the Pain: Aching Pain  Management and Medication Current Pain Management: Medication: No Cold Application: No Rest: No Massage: No Activity: No T.E.N.S.: No Heat Application: No Leg drop or elevation: No Is the Current Pain Management Adequate: Adequate How does your wound impact your activities of daily livingo Sleep: No Bathing: No Appetite: No Relationship With Others: No Bladder Continence:  No Emotions: No Bowel Continence: No Work: No Toileting: No Drive: No Dressing: No Hobbies: No Electronic Signature(s) Signed: 04/18/2021 3:00:15 PM By: Rhae Hammock RN Entered By: Rhae Hammock on 04/17/2021 14:33:53 -------------------------------------------------------------------------------- Patient/Caregiver Education Details Patient Name: Date of Service: [name] 11/22/2022andnbsp2:15 PM Medical Record Number: [id number] Patient Account Number: [id number] Date of Birth/Gender: Treating RN: [birth year] (60 y.o. [name] Primary Care Physician: Latanya Presser Other Clinician: Referring Physician: Treating Physician/Extender: Jones Skene Weeks in Treatment: 2 Education Assessment Education Provided To: Patient Education Topics Provided Pain: Methods: Explain/Verbal Responses: State content correctly Safety: Methods: Explain/Verbal Responses: State content correctly Prien: o Methods: Explain/Verbal Responses: State content correctly Wound/Skin Impairment: Methods: Explain/Verbal Responses: State content correctly Electronic Signature(s) Signed: 04/18/2021 3:00:15 PM By: Rhae Hammock RN Entered By: Rhae Hammock on 04/17/2021 14:49:39 -------------------------------------------------------------------------------- Wound Assessment Details Patient Name: Date of Service: [name] 04/17/2021 2:15 PM Medical Record Number: [id number] Patient Account Number: [id number] Date of Birth/Sex: Treating RN: 26-Jan-1961 (60 y.o. [name], [name] Primary Care [place]: Latanya Presser Other Clinician: Referring Creig Landin: Treating Saniya Tranchina/Extender: Wonda Amis, [name] in Treatment: 2 Wound Status Wound Number: 49 Primary Pressure Ulcer Etiology: Wound Location: Right Ear Wound Open Wounding Event: Pressure Injury Status: Date Acquired:  03/27/2021 Date Acquired: 03/27/2021 Comorbid Anemia, Deep Vein Thrombosis, Colitis, Osteomyelitis, Weeks Of Treatment: 2 History: Neuropathy, Quadriplegia, Confinement Anxiety Clustered Wound: No Wound Measurements Length: (cm) 0.1 Width: (cm) 0.1 Depth: (cm) 0.1 Area: (cm) 0.008 Volume: (cm) 0.001 % Reduction in Area: 98.8% % Reduction in Volume: 98.5% Epithelialization: None Tunneling: No Undermining: No Wound Description Classification: Category/Stage II Wound Margin: Distinct, outline attached Exudate Amount: Medium Exudate Type: Serosanguineous Exudate Color: red, brown Foul Odor After Cleansing: No Slough/Fibrino No Wound Bed Granulation Amount: Large (67-100%) Exposed Structure Granulation Quality: Red Fascia Exposed: No Necrotic Amount: None Present (0%) Fat Layer (Subcutaneous Tissue) Exposed: Yes Tendon Exposed: No Muscle Exposed: No Joint Exposed: No Bone Exposed: No Treatment Notes Wound #49 (Ear) Wound Laterality: Right Cleanser Soap and Water Discharge Instruction: May shower and wash wound with dial antibacterial soap and water prior to dressing change. Wound Cleanser Discharge Instruction: Cleanse the wound with wound cleanser prior to applying a clean dressing using gauze sponges, not tissue or cotton balls. Peri-Wound Care Topical triple antibiotic ointment Discharge Instruction: apply triple antibiotic ointment Primary Dressing Secondary Dressing bandaid or foam border Discharge Instruction: apply over the triple antibiotic ointment. Secured With Compression Wrap Compression Stockings Environmental education officer) Signed: 04/18/2021 3:00:15 PM By: Rhae Hammock RN Entered By: Rhae Hammock on 04/17/2021 15:22:59 -------------------------------------------------------------------------------- Wound Assessment Details Patient Name: Date of Service: [name], [name] 04/17/2021 2:15 PM Medical Record Number: [id number] Patient  Account Number: [id number] Date of Birth/Sex: Treating RN: 05/18/1961 (60 y.o. [name] Primary Care Rayelynn Loyal: Latanya Presser Other Clinician: Referring Sonda Coppens: Treating Ginny Loomer/Extender: Wonda Amis, [name] in Treatment: 2 Wound Status Wound Number: 50 Primary Pressure Ulcer Etiology: Wound Location: Sacrum Wound Open Wounding Event: Pressure Injury Status: Date Acquired: 08/25/2020 Comorbid Anemia, Deep Vein Thrombosis, Colitis, Osteomyelitis, Weeks Of Treatment: 2 History: Neuropathy, Quadriplegia, Confinement Anxiety Clustered Wound: No Photos

## 2021-04-24 ENCOUNTER — Encounter (HOSPITAL_BASED_OUTPATIENT_CLINIC_OR_DEPARTMENT_OTHER): Payer: Medicare Other | Admitting: Internal Medicine

## 2021-04-24 ENCOUNTER — Other Ambulatory Visit: Payer: Self-pay

## 2021-04-24 DIAGNOSIS — G800 Spastic quadriplegic cerebral palsy: Secondary | ICD-10-CM

## 2021-04-24 DIAGNOSIS — S01301A Unspecified open wound of right ear, initial encounter: Secondary | ICD-10-CM | POA: Diagnosis not present

## 2021-04-24 DIAGNOSIS — L89153 Pressure ulcer of sacral region, stage 3: Secondary | ICD-10-CM | POA: Diagnosis not present

## 2021-04-24 NOTE — Progress Notes (Signed)
HANISH, HAMILTON ([id number]) Visit Report for 04/24/2021 Chief Complaint Document Details Patient Name: Date of Service: [name], [name] 04/24/2021 10:45 A M Medical Record Number: [id number] Patient Account Number: [id number] Date of Birth/Sex: Treating RN: 07-05-1960 (60 y.o. M) Primary Care Provider: Latanya Presser Other Clinician: Referring Provider: Treating Provider/Extender: Wonda Amis, [name] in Treatment: 3 Information Obtained from: Patient Chief Complaint 04/03/2021: Sacral ulcer and right ear wound Electronic Signature(s) Signed: 04/24/2021 12:10:00 PM By: Kalman Shan DO Entered By: Kalman Shan on 04/24/2021 12:05:14 -------------------------------------------------------------------------------- HPI Details Patient Name: Date of Service: [name]. 04/24/2021 10:45 A M Medical Record Number: [id number] Patient Account Number: [id number] Date of Birth/Sex: Treating RN: 07-Apr-1961 (60 y.o. 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 He states this developed while he was in the hospital in April for community- acquired pneumonia. He has been admitted to the hospital 4 times since April. On 12/06/2020 he was admitted for sepsis secondary to MRSA bacteremia from a stage IV decubitus ulcer. He had surgical debridement on 12/08/2020 by Dr. Kieth Brightly. He has been using Santyl ointment to the wound bed. He currently lives in a group home. He also has an open wound to his right ear that has been open for the past week. He  lays on his right side consistently throughout the day while watching TV. This is a wound that has waxed and waned in the past depending on how often he watches TV. He denies signs of infection to either wound site. 11/22; patient presents for 2-week follow-up. He reports that the sacral wound is more sore than usual. He currently denies fever/chills, nausea/vomiting or feeling unwell. 11/29; patient presents for 1 week follow-up. He has been taking his antibiotics without issues. He reports improvement to his ear wound. He had a sacral/coccyx x-ray last week. He currently denies signs of infection. Electronic Signature(s) Signed: 04/24/2021 12:10:00 PM By: Kalman Shan DO Entered By: Kalman Shan on 04/24/2021 12:05:54 -------------------------------------------------------------------------------- Physical Exam Details Patient Name: Date of Service: [name], [name] 04/24/2021 10:45 A M Medical Record Number: [id number] Patient Account Number: [id number] Date of Birth/Sex: Treating RN: 16-Jan-1961 (60 y.o. M) Primary Care Provider: Latanya Presser Other Clinician: Referring Provider: Treating Provider/Extender: Wonda Amis, [name] in Treatment: 3 Constitutional respirations regular, non-labored and within target range for patient.Marland Kitchen Psychiatric pleasant and cooperative. Notes Sacrum: Open wound with granulation tissue and scant nonviable tissue. No increased warmth or erythema to the surrounding soft tissue. Right ear: No open wounds. Electronic Signature(s) Signed: 04/24/2021 12:10:00 PM By: Kalman Shan DO Entered By: Kalman Shan on 04/24/2021 12:06:44 -------------------------------------------------------------------------------- Physician Orders Details Patient Name: Date of Service: [name], [name] 04/24/2021 10:45 A M Medical Record Number: [id number] Patient Account Number: [id number] Date of Birth/Sex: Treating RN: 1960-08-30 (60 y.o.  [name] Primary Care Provider: Latanya Presser Other Clinician: Referring Provider: Treating Provider/Extender: Wonda Amis, [name] in Treatment: 3 Verbal / Phone Orders: No Diagnosis Coding Follow-up Appointments ppointment in 2 weeks. - Dr. Heber [place] ****HOYER 52 MINS**** Return A Bathing/ Shower/ Hygiene May shower with protection but do not get wound dressing(s) wet. Off-Loading Turn and reposition every 2  hours - avoid direct pressure to sacrum Home Health Wound #50 Sacrum No change in wound care orders this week; continue Home Health for wound care. May utilize formulary equivalent dressing for wound treatment orders unless otherwise specified. - [facility] home health change three times a week all other days caregivers to change. Other Home Health Orders/Instructions: - [facility] Wound Treatment Wound #50 - Sacrum Cleanser: Wound Cleanser (Home Health) 1 x Per Day/30 Days Discharge Instructions: Cleanse the wound with wound cleanser prior to applying a clean dressing using gauze sponges, not tissue or cotton balls. Peri-Wound Care: Skin Prep (Home Health) 1 x Per Day/30 Days Discharge Instructions: Use skin prep as directed Peri-Wound Care: Zinc Oxide Ointment 30g tube (Home Health) 1 x Per Day/30 Days Discharge Instructions: Apply Zinc Oxide as needed for any maceration to periwound with each dressing change Prim Dressing: Dakin's Solution 0.25%, 16 (oz) (Home Health) 1 x Per Day/30 Days ary Discharge Instructions: Moisten gauze with Dakin's solution Secondary Dressing: Zetuvit Plus Silicone Border Dressing 5x5 (in/in) (Home Health) 1 x Per Day/30 Days Discharge Instructions: Apply silicone border over primary dressing as directed. Electronic Signature(s) Signed: 04/24/2021 12:10:00 PM By: Geralyn Corwin DO Entered By: Geralyn Corwin on 04/24/2021  12:07:10 -------------------------------------------------------------------------------- Problem List Details Patient Name: Date of Service: [name], [name] 04/24/2021 10:45 A M Medical Record Number: [id number] Patient Account Number: [id number] Date of Birth/Sex: Treating RN: 1960/06/17 (60 y.o. M) Primary Care Provider: [name] Other Clinician: Referring Provider: Treating Provider/Extender: Estill Bakes, [name] in Treatment: 3 Active Problems ICD-10 Encounter Code Description Active Date MDM Diagnosis L89.153 Pressure ulcer of sacral region, stage 3 04/03/2021 No Yes S01.301A Unspecified open wound of right ear, initial encounter 04/03/2021 No Yes G80.8 Other cerebral palsy 04/03/2021 No Yes F79 Unspecified intellectual disabilities 04/03/2021 No Yes Z89.511 Acquired absence of right leg below knee 04/03/2021 No Yes Inactive Problems Resolved Problems Electronic Signature(s) Signed: 04/24/2021 12:10:00 PM By: Geralyn Corwin DO Entered By: Geralyn Corwin on 04/24/2021 12:03:48 -------------------------------------------------------------------------------- Progress Note Details Patient Name: Date of Service: [name], [name] Gess. 04/24/2021 10:45 A M Medical Record Number: [id number] Patient Account Number: [id number] Date of Birth/Sex: Treating RN: 07-08-1960 (60 y.o. M) Primary Care Provider: [name] Other Clinician: Referring Provider: Treating Provider/Extender: Estill Bakes, [name] in Treatment: 3 Subjective Chief Complaint Information obtained from Patient 04/03/2021: Sacral ulcer and right ear wound History of Present Illness (HPI) Admission 04/03/2021 Mr. 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 He states this  developed while he was in the hospital in April for community- acquired pneumonia. He has been admitted to the hospital 4 times since April. On 12/06/2020 he was admitted for sepsis secondary to MRSA bacteremia from a stage IV decubitus ulcer. He had surgical debridement on 12/08/2020 by Dr. Sheliah Hatch. He has been using Santyl ointment to the wound bed. He currently lives in a group home. He also has an open wound to his right ear that has been open for the past week. He lays on his right side consistently throughout the day while watching TV. This is a wound that has waxed and waned in the past depending on how often he watches TV. He denies signs of infection to either wound site. 11/22; patient presents for 2-week follow-up. He reports that the sacral wound is more sore than  usual. He currently denies fever/chills, nausea/vomiting or feeling unwell. 11/29; patient presents for 1 week follow-up. He has been taking his antibiotics without issues. He reports improvement to his ear wound. He had a sacral/coccyx x-ray last week. He currently denies signs of infection. Patient History Information obtained from Patient. Family History Unknown History. Social History Never smoker, Marital Status - Single, Alcohol Use - Never, Drug Use - No History, Caffeine Use - Moderate. Medical History Eyes Denies history of Cataracts, Glaucoma, Optic Neuritis Ear/Nose/Mouth/Throat Denies history of Chronic sinus problems/congestion, Middle ear problems Hematologic/Lymphatic Patient has history of Anemia - microcytic Respiratory Denies history of Aspiration, Asthma, Chronic Obstructive Pulmonary Disease (COPD), Pneumothorax, Sleep Apnea, Tuberculosis Cardiovascular Patient has history of Deep Vein Thrombosis Denies history of Angina, Arrhythmia, Congestive Heart Failure, Coronary Artery Disease, Hypertension, Hypotension, Myocardial Infarction, Peripheral Arterial Disease, Peripheral Venous Disease,  Phlebitis, Vasculitis Gastrointestinal Patient has history of Colitis Denies history of Cirrhosis , Crohnoos, Hepatitis A, Hepatitis B, Hepatitis C Endocrine Denies history of Type I Diabetes, Type II Diabetes Genitourinary Denies history of End Stage Renal Disease Immunological Denies history of Lupus Erythematosus, Raynaudoos, Scleroderma Integumentary (Skin) Denies history of History of Burn Musculoskeletal Patient has history of Osteomyelitis Neurologic Patient has history of Neuropathy, Quadriplegia - spastic Oncologic Denies history of Received Chemotherapy, Received Radiation Psychiatric Patient has history of Confinement Anxiety Denies history of Anorexia/bulimia Hospitalization/Surgery History - surgery to straughten legs. 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 - hiatal hernia repair. - (R) gt toe amputation. - Constipation. 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 - Pneumonia. - left BKA 3//07/2020. - PNA 07/2020. - SNF from inpatient with PNA till 02/24/2021. Medical A Surgical History Notes nd Co-Morbid Conditions h/o spiral fracture distal (L) tibia , h/o shingles , MR , infantile CP Ear/Nose/Mouth/Throat hiatal hernia Respiratory h/o pneumonia , chronic bronchitis Cardiovascular hypercholesterolemia Gastrointestinal acute gastric ulcer with hemorrhage , constipation , GERD , Abdominal Distention Genitourinary incontinence (on Detrol) Renal Glycosuria Integumentary (Skin) Eczema Acne Vulgaris Onychomycosis Pressure Ulcers Musculoskeletal DJD of shoulder Neurologic cerebral palsy Psychiatric general anxiety , bipolar , depression, Mental Retardation Objective Constitutional respirations regular, non-labored and within target range for patient.. Vitals Time Taken: 11:25 AM, Temperature: 98.3 F, Pulse: 93 bpm, Respiratory Rate: 16 breaths/min, Blood Pressure: 116/84 mmHg. Psychiatric pleasant and cooperative. General Notes: Sacrum: Open wound with granulation tissue and  scant nonviable tissue. No increased warmth or erythema to the surrounding soft tissue. Right ear: No open wounds. Integumentary (Hair, Skin) Wound #49 status is Open. Original cause of wound was Pressure Injury. The date acquired was: 03/27/2021. The wound has been in treatment 3 weeks. The wound is located on the Right Ear. The wound measures 0cm length x 0cm width x 0cm depth; 0cm^2 area and 0cm^3 volume. There is Fat Layer (Subcutaneous Tissue) exposed. There is no tunneling or undermining noted. There is a none present amount of drainage noted. The wound margin is distinct with the outline attached to the wound base. There is large (67-100%) red granulation within the wound bed. There is no necrotic tissue within the wound bed. Wound #50 status is Open. Original cause of wound was Pressure Injury. The date acquired was: 08/25/2020. The wound has been in treatment 3 weeks. The wound is located on the Sacrum. The wound measures 4.2cm length x 2.3cm width x 0.5cm depth; 7.587cm^2 area and 3.793cm^3 volume. There is Fat Layer (Subcutaneous Tissue) exposed. There is no tunneling or undermining noted. There is a medium amount of serosanguineous drainage noted. The  wound margin is distinct with the outline attached to the wound base. There is large (67-100%) red, pink granulation within the wound bed. There is a small (1-33%) amount of necrotic tissue within the wound bed including Adherent Slough. Assessment Active Problems ICD-10 Pressure ulcer of sacral region, stage 3 Unspecified open wound of right ear, initial encounter Other cerebral palsy Unspecified intellectual disabilities Acquired absence of right leg below knee Patients sacral wound has shown improvement in appearance since last clinic visit. There are no signs of infection. His x-ray showed no evidence of osteomyelitis. I recommended continuing Dakin's wet-to-dry dressing and zinc oxide to the periwound. He is right ear wound has  healed. His caregiver mentioned that he has been repositioned more often and I think this has helped greatly. Plan Follow-up Appointments: Return Appointment in 2 weeks. - Dr. Heber [place] ****HOYER 60 MINS**** Bathing/ Shower/ Hygiene: May shower with protection but do not get wound dressing(s) wet. Off-Loading: Turn and reposition every 2 hours - avoid direct pressure to sacrum Home Health: Wound #50 Sacrum: No change in wound care orders this week; continue Home Health for wound care. May utilize formulary equivalent dressing for wound treatment orders unless otherwise specified. - [facility] home health change three times a week all other days caregivers to change. Other Home Health Orders/Instructions: - [facility] WOUND #50: - Sacrum Wound Laterality: Cleanser: Wound Cleanser (Home Health) 1 x Per Day/30 Days Discharge Instructions: Cleanse the wound with wound cleanser prior to applying a clean dressing using gauze sponges, not tissue or cotton balls. Peri-Wound Care: Skin Prep (Home Health) 1 x Per Day/30 Days Discharge Instructions: Use skin prep as directed Peri-Wound Care: Zinc Oxide Ointment 30g tube (Home Health) 1 x Per Day/30 Days Discharge Instructions: Apply Zinc Oxide as needed for any maceration to periwound with each dressing change Prim Dressing: Dakin's Solution 0.25%, 16 (oz) (Home Health) 1 x Per Day/30 Days ary Discharge Instructions: Moisten gauze with Dakin's solution Secondary Dressing: Zetuvit Plus Silicone Border Dressing 5x5 (in/in) (Home Health) 1 x Per Day/30 Days Discharge Instructions: Apply silicone border over primary dressing as directed. 1. Dakin's wet-to-dry dressings 2. Aggressive offloading 3. Follow-up in 2 weeks Electronic Signature(s) Signed: 04/24/2021 12:10:00 PM By: Kalman Shan DO Entered By: Kalman Shan on 04/24/2021 12:09:25 -------------------------------------------------------------------------------- HxROS Details Patient  Name: Date of Service: [name], [name] Gala. 04/24/2021 10:45 A M Medical Record Number: [id number] Patient Account Number: [id number] Date of Birth/Sex: Treating RN: [birth year] (60 y.o. M) Primary Care Provider: Latanya Presser Other Clinician: Referring Provider: Treating Provider/Extender: Wonda Amis, [name] in Treatment: 3 Information Obtained From Patient Co-Morbid Conditions Medical History: Past Medical History Notes: h/o spiral fracture distal (L) tibia , h/o shingles , MR , infantile CP Eyes Medical History: Negative for: Cataracts; Glaucoma; Optic Neuritis Ear/Nose/Mouth/Throat Medical History: Negative for: Chronic sinus problems/congestion; Middle ear problems Past Medical History Notes: hiatal hernia Hematologic/Lymphatic Medical History: Positive for: Anemia - microcytic Respiratory Medical History: Negative for: Aspiration; Asthma; Chronic Obstructive Pulmonary Disease (COPD); Pneumothorax; Sleep Apnea; Tuberculosis Past Medical History Notes: h/o pneumonia , chronic bronchitis Cardiovascular Medical History: Positive for: Deep Vein Thrombosis Negative for: Angina; Arrhythmia; Congestive Heart Failure; Coronary Artery Disease; Hypertension; Hypotension; Myocardial Infarction; Peripheral Arterial Disease; Peripheral Venous Disease; Phlebitis; Vasculitis Past Medical History Notes: hypercholesterolemia Gastrointestinal Medical History: Positive for: Colitis Negative for: Cirrhosis ; Crohns; Hepatitis A; Hepatitis B; Hepatitis C Past Medical History Notes: acute gastric ulcer with hemorrhage , constipation , GERD , Abdominal Distention Endocrine Medical History: Negative for: Type  I Diabetes; Type II Diabetes Genitourinary Medical History: Negative for: End Stage Renal Disease Past Medical History Notes: incontinence (on Detrol) Renal Glycosuria Immunological Medical History: Negative for: Lupus Erythematosus; Raynauds;  Scleroderma Integumentary (Skin) Medical History: Negative for: History of Burn Past Medical History Notes: Eczema Acne Vulgaris Onychomycosis Pressure Ulcers Musculoskeletal Medical History: Positive for: Osteomyelitis Past Medical History Notes: DJD of shoulder Neurologic Medical History: Positive for: Neuropathy; Quadriplegia - spastic Past Medical History Notes: cerebral palsy Oncologic Medical History: Negative for: Received Chemotherapy; Received Radiation Psychiatric Medical History: Positive for: Confinement Anxiety Negative for: Anorexia/bulimia Past Medical History Notes: general anxiety , bipolar , depression, Mental Retardation Immunizations Pneumococcal Vaccine: Received Pneumococcal Vaccination: Yes Received Pneumococcal Vaccination On or After 60th Birthday: Yes Implantable Devices None Hospitalization / Surgery History Type of Hospitalization/Surgery surgery to straughten legs 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) hiatal hernia repair (R) gt toe amputation Constipation 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 Pneumonia left BKA 3//07/2020 PNA 07/2020 SNF from inpatient with PNA till 02/24/2021 Family and Social History Unknown History: Yes; Never smoker; Marital Status - Single; Alcohol Use: Never; Drug Use: No History; Caffeine Use: Moderate; Financial Concerns: No; Food, Clothing or Shelter Needs: No; Support System Lacking: No; Transportation Concerns: No Electronic Signature(s) Signed: 04/24/2021 12:10:00 PM By: Kalman Shan DO Entered By: Kalman Shan on 04/24/2021 12:06:04 -------------------------------------------------------------------------------- SuperBill Details Patient Name: Date of Service: [name] 04/24/2021 Medical Record Number: [id number] Patient Account Number: [id number] Date of Birth/Sex: Treating RN: 10-07-1960 (60 y.o. [name] Primary Care Provider: Latanya Presser Other Clinician: Referring  Provider: Treating Provider/Extender: Wonda Amis, [name] in Treatment: 3 Diagnosis Coding ICD-10 Codes Code Description [id number] Pressure ulcer of sacral region, stage 3 S01.301A Unspecified open wound of right ear, initial encounter G80.8 Other cerebral palsy F79 Unspecified intellectual disabilities Z89.511 Acquired absence of right leg below knee Facility Procedures CPT4 Code: [id number] Description: 99214 - WOUND CARE VISIT-LEV 4 EST PT Modifier: Quantity: 1 Physician Procedures : CPT4 Code Description Modifier [id number] - WC PHYS LEVEL 3 - EST PT ICD-10 Diagnosis Description L89.153 Pressure ulcer of sacral region, stage 3 S01.301A Unspecified open wound of right ear, initial encounter G80.8 Other cerebral palsy Quantity: 1 Electronic Signature(s) Signed: 04/24/2021 12:10:00 PM By: Kalman Shan DO Entered By: Kalman Shan on 04/24/2021 12:09:47

## 2021-04-24 NOTE — Progress Notes (Signed)
[name], [name] ([id number]) Visit Report for 04/24/2021 Arrival Information Details Patient Name: Date of Service: [name], [name] 04/24/2021 10:45 A M Medical Record Number: [id number] Patient Account Number: [id number] Date of Birth/Sex: Treating RN: 1961-04-21 (60 y.o. M) Primary Care Victora Irby: Latanya Presser Other Clinician: Referring Jamarious Febo: Treating Mitsuko Luera/Extender: Wonda Amis, [name] in Treatment: 3 Visit Information History Since Last Visit Added or deleted any medications: No Patient Arrived: Wheel Chair Any new allergies or adverse reactions: No Arrival Time: 11:30 Had a fall or experienced change in No Accompanied By: caregiver activities of daily living that may affect Transfer Assistance: Harrel Lemon Lift risk of falls: Patient Requires Transmission-Based Precautions: No Signs or symptoms of abuse/neglect since last visito No Patient Has Alerts: Yes Hospitalized since last visit: No Implantable device outside of the clinic excluding No cellular tissue based products placed in the center since last visit: Has Dressing in Place as Prescribed: Yes Pain Present Now: No Electronic Signature(s) Signed: 04/24/2021 5:02:58 PM By: Dellie Catholic RN Entered By: Dellie Catholic on 04/24/2021 11:31:28 -------------------------------------------------------------------------------- Clinic Level of Care Assessment Details Patient Name: Date of Service: [name], [name] 04/24/2021 10:45 A M Medical Record Number: [id number] Patient Account Number: [id number] Date of Birth/Sex: Treating RN: 1960-12-05 (60 y.o. [name] Primary Care Ritvik Mczeal: Latanya Presser Other Clinician: Referring Zia Najera: Treating Daren Doswell/Extender: Wonda Amis, [name] in Treatment: 3 Clinic Level of Care Assessment Items TOOL 4 Quantity Score []  - 0 Use when only an EandM is performed on FOLLOW-UP visit ASSESSMENTS - Nursing Assessment /  Reassessment X- 1 10 Reassessment of Co-morbidities (includes updates in patient status) X- 1 5 Reassessment of Adherence to Treatment Plan ASSESSMENTS - Wound and Skin A ssessment / Reassessment []  - 0 Simple Wound Assessment / Reassessment - one wound X- 2 5 Complex Wound Assessment / Reassessment - multiple wounds []  - 0 Dermatologic / Skin Assessment (not related to wound area) ASSESSMENTS - Focused Assessment []  - 0 Circumferential Edema Measurements - multi extremities []  - 0 Nutritional Assessment / Counseling / Intervention []  - 0 Lower Extremity Assessment (monofilament, tuning fork, pulses) []  - 0 Peripheral Arterial Disease Assessment (using hand held doppler) ASSESSMENTS - Ostomy and/or Continence Assessment and Care []  - 0 Incontinence Assessment and Management []  - 0 Ostomy Care Assessment and Management (repouching, etc.) PROCESS - Coordination of Care X - Simple Patient / Family Education for ongoing care 1 15 []  - 0 Complex (extensive) Patient / Family Education for ongoing care X- 1 10 Staff obtains Programmer, systems, Records, T Results / Process Orders est X- 1 10 Staff telephones HHA, Nursing Homes / Clarify orders / etc []  - 0 Routine Transfer to another Facility (non-emergent condition) []  - 0 Routine Hospital Admission (non-emergent condition) []  - 0 New Admissions / Biomedical engineer / Ordering NPWT Apligraf, etc. , []  - 0 Emergency Hospital Admission (emergent condition) X- 1 10 Simple Discharge Coordination []  - 0 Complex (extensive) Discharge Coordination PROCESS - Special Needs []  - 0 Pediatric / Minor Patient Management []  - 0 Isolation Patient Management []  - 0 Hearing / Language / Visual special needs []  - 0 Assessment of Community assistance (transportation, D/C planning, etc.) []  - 0 Additional assistance / Altered mentation []  - 0 Support Surface(s) Assessment (bed, cushion, seat, etc.) INTERVENTIONS - Wound Cleansing /  Measurement []  - 0 Simple Wound Cleansing - one wound X- 1 5 Complex Wound Cleansing - multiple wounds X- 1 5 Wound Imaging (photographs - any number of wounds) []  -  How does your wound impact your activities of daily livingo Sleep: No Bathing: No Appetite: No Relationship With Others: No Bladder Continence: No Emotions: No Bowel Continence: No Work: No Toileting: No Drive: No Dressing: No Hobbies: No Electronic Signature(s) Signed: 04/24/2021 5:02:58 PM By: Dellie Catholic RN Entered By: Dellie Catholic on 04/24/2021  11:33:03 -------------------------------------------------------------------------------- Patient/Caregiver Education Details Patient Name: Date of Service: [name] 11/29/2022andnbsp10:45 A M Medical Record Number: [id number] Patient Account Number: [id number] Date of Birth/Gender: Treating RN: [birth year] (60 y.o. [name] Primary Care Physician: Latanya Presser Other Clinician: Referring Physician: Treating Physician/Extender: Jones Skene Weeks in Treatment: 3 Education Assessment Education Provided To: Patient Education Topics Provided Pressure: Methods: Explain/Verbal, Printed Responses: Reinforcements needed, State content correctly Wound/Skin Impairment: Methods: Explain/Verbal Responses: Reinforcements needed, State content correctly Electronic Signature(s) Signed: 04/24/2021 5:53:22 PM By: Baruch Gouty RN, BSN Signed: 04/24/2021 5:53:22 PM By: Baruch Gouty RN, BSN Entered By: Baruch Gouty on 04/24/2021 11:26:08 -------------------------------------------------------------------------------- Wound Assessment Details Patient Name: Date of Service: [name], [name] 04/24/2021 10:45 A M Medical Record Number: [id number] Patient Account Number: [id number] Date of Birth/Sex: Treating RN: October 31, 1960 (60 y.o. M) Primary Care Lezette Kitts: Latanya Presser Other Clinician: Referring Elise Gladden: Treating Sabrina Keough/Extender: Wonda Amis, [name] in Treatment: 3 Wound Status Wound Number: 49 Primary Pressure Ulcer Etiology: Wound Location: Right Ear Wound Open Wounding Event: Pressure Injury Status: Date Acquired: 03/27/2021 Comorbid Anemia, Deep Vein Thrombosis, Colitis, Osteomyelitis, Weeks Of Treatment: 3 History: Neuropathy, Quadriplegia, Confinement Anxiety Clustered Wound: No Photos Wound Measurements Length: (cm) Width: (cm) Depth: (cm) Area: (cm) Volume: (cm) 0 % Reduction in Area: 100% 0  % Reduction in Volume: 100% 0 Epithelialization: None 0 Tunneling: No 0 Undermining: No Wound Description Classification: Category/Stage II Wound Margin: Distinct, outline attached Exudate Amount: None Present Foul Odor After Cleansing: No Slough/Fibrino No Wound Bed Granulation Amount: Large (67-100%) Exposed Structure Granulation Quality: Red Fascia Exposed: No Necrotic Amount: None Present (0%) Fat Layer (Subcutaneous Tissue) Exposed: Yes Tendon Exposed: No Muscle Exposed: No Joint Exposed: No Bone Exposed: No Electronic Signature(s) Signed: 04/24/2021 5:02:58 PM By: Dellie Catholic RN Previous Signature: 04/24/2021 11:34:02 AM Version By: Valeria Batman EMT Entered By: Dellie Catholic on 04/24/2021 11:36:57 -------------------------------------------------------------------------------- Wound Assessment Details Patient Name: Date of Service: [name] 04/24/2021 10:45 A M Medical Record Number: [id number] Patient Account Number: [id number] Date of Birth/Sex: Treating RN: [birth year] (60 y.o. M) Primary Care Kharizma Lesnick: Latanya Presser Other Clinician: Referring  Carmack: Treating Loribeth Katich/Extender: Wonda Amis, [name] in Treatment: 3 Wound Status Wound Number: 50 Primary Pressure Ulcer Etiology: Wound Location: Sacrum Wound Open Wounding Event: Pressure Injury Status: Date Acquired: 08/25/2020 Comorbid Anemia, Deep Vein Thrombosis, Colitis, Osteomyelitis, Weeks Of Treatment: 3 History: Neuropathy, Quadriplegia, Confinement Anxiety Clustered Wound: No Photos Wound Measurements Length: (cm) 4.2 Width: (cm) 2.3 Depth: (cm) 0.5 Area: (cm) 7.587 Volume: (cm) 3.793 % Reduction in Area: 17.9% % Reduction in Volume: 31.6% Epithelialization: None Tunneling: No Undermining: No Wound Description Classification: Category/Stage III Wound Margin: Distinct, outline attached Exudate Amount: Medium Exudate Type: Serosanguineous Exudate  Color: red, brown Foul Odor After Cleansing: No Slough/Fibrino Yes Wound Bed Granulation Amount: Large (67-100%) Exposed Structure Granulation Quality: Red, Pink Fascia Exposed: No Necrotic Amount: Small (1-33%) Fat Layer (Subcutaneous Tissue) Exposed: Yes Necrotic Quality: Adherent Slough Tendon Exposed: No Muscle Exposed: No Joint Exposed: No Bone Exposed: No Treatment Notes Wound #50 (Sacrum) Cleanser Peri-Wound Care Skin Prep Discharge Instruction: Use skin prep as directed Zinc Oxide Ointment 30g tube Discharge Instruction: Apply  How does your wound impact your activities of daily livingo Sleep: No Bathing: No Appetite: No Relationship With Others: No Bladder Continence: No Emotions: No Bowel Continence: No Work: No Toileting: No Drive: No Dressing: No Hobbies: No Electronic Signature(s) Signed: 04/24/2021 5:02:58 PM By: Dellie Catholic RN Entered By: Dellie Catholic on 04/24/2021  11:33:03 -------------------------------------------------------------------------------- Patient/Caregiver Education Details Patient Name: Date of Service: [name] 11/29/2022andnbsp10:45 A M Medical Record Number: [id number] Patient Account Number: [id number] Date of Birth/Gender: Treating RN: [birth year] (60 y.o. [name] Primary Care Physician: Latanya Presser Other Clinician: Referring Physician: Treating Physician/Extender: Jones Skene Weeks in Treatment: 3 Education Assessment Education Provided To: Patient Education Topics Provided Pressure: Methods: Explain/Verbal, Printed Responses: Reinforcements needed, State content correctly Wound/Skin Impairment: Methods: Explain/Verbal Responses: Reinforcements needed, State content correctly Electronic Signature(s) Signed: 04/24/2021 5:53:22 PM By: Baruch Gouty RN, BSN Signed: 04/24/2021 5:53:22 PM By: Baruch Gouty RN, BSN Entered By: Baruch Gouty on 04/24/2021 11:26:08 -------------------------------------------------------------------------------- Wound Assessment Details Patient Name: Date of Service: [name], [name] 04/24/2021 10:45 A M Medical Record Number: [id number] Patient Account Number: [id number] Date of Birth/Sex: Treating RN: October 31, 1960 (60 y.o. M) Primary Care Lezette Kitts: Latanya Presser Other Clinician: Referring Elise Gladden: Treating Sabrina Keough/Extender: Wonda Amis, [name] in Treatment: 3 Wound Status Wound Number: 49 Primary Pressure Ulcer Etiology: Wound Location: Right Ear Wound Open Wounding Event: Pressure Injury Status: Date Acquired: 03/27/2021 Comorbid Anemia, Deep Vein Thrombosis, Colitis, Osteomyelitis, Weeks Of Treatment: 3 History: Neuropathy, Quadriplegia, Confinement Anxiety Clustered Wound: No Photos Wound Measurements Length: (cm) Width: (cm) Depth: (cm) Area: (cm) Volume: (cm) 0 % Reduction in Area: 100% 0  % Reduction in Volume: 100% 0 Epithelialization: None 0 Tunneling: No 0 Undermining: No Wound Description Classification: Category/Stage II Wound Margin: Distinct, outline attached Exudate Amount: None Present Foul Odor After Cleansing: No Slough/Fibrino No Wound Bed Granulation Amount: Large (67-100%) Exposed Structure Granulation Quality: Red Fascia Exposed: No Necrotic Amount: None Present (0%) Fat Layer (Subcutaneous Tissue) Exposed: Yes Tendon Exposed: No Muscle Exposed: No Joint Exposed: No Bone Exposed: No Electronic Signature(s) Signed: 04/24/2021 5:02:58 PM By: Dellie Catholic RN Previous Signature: 04/24/2021 11:34:02 AM Version By: Valeria Batman EMT Entered By: Dellie Catholic on 04/24/2021 11:36:57 -------------------------------------------------------------------------------- Wound Assessment Details Patient Name: Date of Service: [name] 04/24/2021 10:45 A M Medical Record Number: [id number] Patient Account Number: [id number] Date of Birth/Sex: Treating RN: [birth year] (60 y.o. M) Primary Care Kharizma Lesnick: Latanya Presser Other Clinician: Referring  Carmack: Treating Loribeth Katich/Extender: Wonda Amis, [name] in Treatment: 3 Wound Status Wound Number: 50 Primary Pressure Ulcer Etiology: Wound Location: Sacrum Wound Open Wounding Event: Pressure Injury Status: Date Acquired: 08/25/2020 Comorbid Anemia, Deep Vein Thrombosis, Colitis, Osteomyelitis, Weeks Of Treatment: 3 History: Neuropathy, Quadriplegia, Confinement Anxiety Clustered Wound: No Photos Wound Measurements Length: (cm) 4.2 Width: (cm) 2.3 Depth: (cm) 0.5 Area: (cm) 7.587 Volume: (cm) 3.793 % Reduction in Area: 17.9% % Reduction in Volume: 31.6% Epithelialization: None Tunneling: No Undermining: No Wound Description Classification: Category/Stage III Wound Margin: Distinct, outline attached Exudate Amount: Medium Exudate Type: Serosanguineous Exudate  Color: red, brown Foul Odor After Cleansing: No Slough/Fibrino Yes Wound Bed Granulation Amount: Large (67-100%) Exposed Structure Granulation Quality: Red, Pink Fascia Exposed: No Necrotic Amount: Small (1-33%) Fat Layer (Subcutaneous Tissue) Exposed: Yes Necrotic Quality: Adherent Slough Tendon Exposed: No Muscle Exposed: No Joint Exposed: No Bone Exposed: No Treatment Notes Wound #50 (Sacrum) Cleanser Peri-Wound Care Skin Prep Discharge Instruction: Use skin prep as directed Zinc Oxide Ointment 30g tube Discharge Instruction: Apply  0 Wound Tracing (instead of photographs) []  - 0 Simple Wound Measurement - one wound X- 2 5 Complex Wound Measurement - multiple wounds INTERVENTIONS - Wound Dressings X - Small Wound Dressing one or multiple wounds 1 10 []  - 0 Medium Wound Dressing one or multiple wounds []  - 0 Large Wound Dressing one or multiple wounds X- 1 5 Application of Medications - topical []  - 0 Application of Medications - injection INTERVENTIONS - Miscellaneous []  - 0 External ear exam []  - 0 Specimen Collection (cultures, biopsies, blood, body fluids, etc.) []  - 0 Specimen(s) / Culture(s) sent or taken to Lab for analysis X- 1 10 Patient Transfer (multiple staff / Civil Service fast streamer / Similar devices) []  - 0 Simple Staple / Suture removal (25 or less) []  - 0 Complex Staple / Suture removal (26 or more) []  - 0 Hypo / Hyperglycemic Management (close monitor of Blood Glucose) []  - 0 Ankle / Brachial Index (ABI) - do not check if billed separately X- 1 5 Vital Signs Has the patient been seen at the hospital within the last three years: Yes Total Score: 120 Level Of Care: New/Established - Level 4 Electronic Signature(s) Signed: 04/24/2021 5:53:22 PM By: Baruch Gouty RN, BSN Entered By: Baruch Gouty on 04/24/2021 11:45:08 -------------------------------------------------------------------------------- Encounter Discharge Information Details Patient Name: Date of Service: [name]. 04/24/2021 10:45 A M Medical Record Number: [id number] Patient Account Number: [id number] Date of Birth/Sex: Treating RN: 04/25/1961 (60 y.o. [name] Primary Care Brentlee Sciara: Latanya Presser Other Clinician: Referring Reece Mcbroom: Treating Moncerrat Burnstein/Extender: Wonda Amis, [name] in Treatment: 3 Encounter Discharge  Information Items Discharge Condition: Stable Ambulatory Status: Wheelchair Discharge Destination: Home Transportation: Private Auto Accompanied By: caregiver Schedule Follow-up Appointment: Yes Clinical Summary of Care: Patient Declined Electronic Signature(s) Signed: 04/24/2021 5:53:22 PM By: Baruch Gouty RN, BSN Entered By: Baruch Gouty on 04/24/2021 12:05:00 -------------------------------------------------------------------------------- Lower Extremity Assessment Details Patient Name: Date of Service: [name], [name] 04/24/2021 10:45 A M Medical Record Number: [id number] Patient Account Number: [id number] Date of Birth/Sex: Treating RN: Sep 19, 1960 (60 y.o. M) Primary Care Elizardo Chilson: Latanya Presser Other Clinician: Referring Aaniya Sterba: Treating Eugenio Dollins/Extender: Wonda Amis, [name] in Treatment: 3 Electronic Signature(s) Signed: 04/24/2021 5:02:58 PM By: Dellie Catholic RN Entered By: Dellie Catholic on 04/24/2021 11:31:46 -------------------------------------------------------------------------------- Multi Wound Chart Details Patient Name: Date of Service: [name], [name] 04/24/2021 10:45 A M Medical Record Number: [id number] Patient Account Number: [id number] Date of Birth/Sex: Treating RN: Jun 02, 1960 (60 y.o. M) Primary Care Almeta Geisel: Latanya Presser Other Clinician: Referring Elyan Vanwieren: Treating Jazmina Muhlenkamp/Extender: Wonda Amis, [name] in Treatment: 3 Vital Signs Height(in): Pulse(bpm): 64 Weight(lbs): Blood Pressure(mmHg): 116/84 Body Mass Index(BMI): Temperature(F): 98.3 Respiratory Rate(breaths/min): 16 Photos: [N/A:N/A] Right Ear Sacrum N/A Wound Location: Pressure Injury Pressure Injury N/A Wounding Event: Pressure Ulcer Pressure Ulcer N/A Primary Etiology: Anemia, Deep Vein Thrombosis, Anemia, Deep Vein Thrombosis, N/A Comorbid History: Colitis, Osteomyelitis, Neuropathy, Colitis, Osteomyelitis,  Neuropathy, Quadriplegia, Confinement Anxiety Quadriplegia, Confinement Anxiety 03/27/2021 08/25/2020 N/A Date Acquired: 3 3 N/A Weeks of Treatment: Open Open N/A Wound Status: 0x0x0 4.2x2.3x0.5 N/A Measurements L x W x D (cm) 0 7.587 N/A A (cm) : rea 0 3.793 N/A Volume (cm) : 100.00% 17.90% N/A % Reduction in A rea: 100.00% 31.60% N/A % Reduction in Volume: Category/Stage II Category/Stage III N/A Classification: None Present Medium N/A Exudate A mount: N/A Serosanguineous N/A Exudate Type: N/A red, brown N/A Exudate Color: Distinct, outline attached Distinct, outline attached N/A Wound Margin: Large (67-100%) Large (67-100%) N/A Granulation A  Zinc Oxide as needed for any maceration to periwound with each dressing change Topical Primary Dressing anacept gel moistened gauze Secondary Dressing Zetuvit Plus Silicone Border Dressing 5x5 (in/in) Discharge Instruction: Apply silicone border over primary dressing as directed. Secured With Compression Wrap Compression Stockings Environmental education officer) Signed: 04/24/2021 11:31:48 AM By: Valeria Batman EMT Entered By: Valeria Batman on 04/24/2021 11:31:47 -------------------------------------------------------------------------------- Vitals Details Patient Name: Date of Service: [name]. 04/24/2021 10:45 A M Medical Record Number: [id number] Patient Account Number: [id number] Date of Birth/Sex: Treating RN: June 11, 1960 (60 y.o. M) Primary Care Chapin Arduini: Latanya Presser Other Clinician: Referring Zakya Halabi: Treating Bertram Haddix/Extender: Wonda Amis, [name] in Treatment: 3 Vital Signs Time Taken: 11:25 Temperature (F): 98.3 Pulse (bpm): 93 Respiratory Rate (breaths/min): 16 Blood Pressure (mmHg): 116/84 Reference Range: 80 - 120 mg / dl Electronic Signature(s) Signed: 04/24/2021 5:02:58 PM By: Dellie Catholic RN Entered By: Dellie Catholic on 04/24/2021 11:30:51

## 2021-05-08 ENCOUNTER — Encounter (HOSPITAL_BASED_OUTPATIENT_CLINIC_OR_DEPARTMENT_OTHER): Payer: Medicare Other | Attending: Internal Medicine | Admitting: Internal Medicine

## 2021-05-08 ENCOUNTER — Other Ambulatory Visit: Payer: Self-pay

## 2021-05-08 DIAGNOSIS — X58XXXA Exposure to other specified factors, initial encounter: Secondary | ICD-10-CM | POA: Diagnosis not present

## 2021-05-08 DIAGNOSIS — Z89511 Acquired absence of right leg below knee: Secondary | ICD-10-CM | POA: Diagnosis not present

## 2021-05-08 DIAGNOSIS — L89153 Pressure ulcer of sacral region, stage 3: Secondary | ICD-10-CM

## 2021-05-08 DIAGNOSIS — S01301A Unspecified open wound of right ear, initial encounter: Secondary | ICD-10-CM | POA: Diagnosis not present

## 2021-05-08 DIAGNOSIS — F79 Unspecified intellectual disabilities: Secondary | ICD-10-CM | POA: Diagnosis not present

## 2021-05-08 DIAGNOSIS — G808 Other cerebral palsy: Secondary | ICD-10-CM | POA: Diagnosis not present

## 2021-05-08 NOTE — Progress Notes (Addendum)
[name], [name] ([id number]) Visit Report for 05/08/2021 Chief Complaint Document Details Patient Name: Date of Service: [name], [name] 05/08/2021 10:30 A M Medical Record Number: [id number] Patient Account Number: [id number] Date of Birth/Sex: Treating RN: [birth year] (60 y.o. [name] Primary Care Provider: [name] Other Clinician: Referring Provider: Treating Provider/Extender: Estill Bakes, [name] in Treatment: 5 Information Obtained from: Patient Chief Complaint 04/03/2021: Sacral ulcer and right ear wound Electronic Signature(s) Signed: 05/08/2021 11:11:07 AM By: Geralyn Corwin DO Entered By: Geralyn Corwin on 05/08/2021 11:07:51 -------------------------------------------------------------------------------- HPI Details Patient Name: Date of Service: [name] [name]. 05/08/2021 10:30 A M Medical Record Number: [id number] Patient Account Number: [id number] Date of Birth/Sex: Treating RN: 10/13/1960 (60 y.o. 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 He states this developed while he was in the hospital in April for community- acquired pneumonia. He has been admitted to the hospital 4 times since April. On 12/06/2020 he was admitted for sepsis secondary to MRSA bacteremia from a stage IV decubitus ulcer. He had surgical debridement on 12/08/2020 by Dr. Sheliah Hatch. He has been using Santyl ointment to the wound bed. He currently lives in a group home. He also has an open wound to his right ear that has been  open for the past week. He lays on his right side consistently throughout the day while watching TV. This is a wound that has waxed and waned in the past depending on how often he watches TV. He denies signs of infection to either wound site. 11/22; patient presents for 2-week follow-up. He reports that the sacral wound is more sore than usual. He currently denies fever/chills, nausea/vomiting or feeling unwell. 11/29; patient presents for 1 week follow-up. He has been taking his antibiotics without issues. He reports improvement to his ear wound. He had a sacral/coccyx x-ray last week. He currently denies signs of infection. 12/13; patient presents for 2 week follow-up. He has no issues or complaints today. He denies signs of infection. Electronic Signature(s) Signed: 05/08/2021 11:11:07 AM By: Geralyn Corwin DO Entered By: Geralyn Corwin on 05/08/2021 11:08:21 -------------------------------------------------------------------------------- Physical Exam Details Patient Name: Date of Service: [name], [name] 05/08/2021 10:30 A M Medical Record Number: [id number] Patient Account Number: [id number] Date of Birth/Sex: Treating RN: 08-Jul-1960 (60 y.o. [name] Primary Care Provider: [name] Other Clinician: Referring Provider: Treating Provider/Extender: Estill Bakes, [name] in Treatment: 5 Constitutional respirations regular, non-labored and within target range for patient.Marland Kitchen Psychiatric pleasant and cooperative. Notes Sacrum: Open wound with granulation tissue and scant nonviable tissue. No surrounding signs of infection. Electronic Signature(s) Signed: 05/08/2021 11:11:07 AM By: Geralyn Corwin DO Entered By: Geralyn Corwin on 05/08/2021 11:09:15 -------------------------------------------------------------------------------- Physician Orders Details Patient Name: Date of Service: [name], [name] 05/08/2021 10:30 A M Medical Record Number:  [id number] Patient Account Number: [id number] Date of Birth/Sex: Treating RN: 08/31/1960 (60 y.o. [name] Primary Care Provider: [name] Other Clinician: Referring Provider: Treating Provider/Extender: Estill Bakes, [name] in Treatment: 5 Verbal / Phone Orders: No Diagnosis Coding Follow-up Appointments Return appointment in 3 weeks. - Dr. Mikey Bussing ****HOYER 60 MINS**** Bathing/ Shower/ Hygiene May shower with  protection but do not get wound dressing(s) wet. Off-Loading Turn and reposition every 2 hours - avoid direct pressure to sacrum Home Health Wound #50 Sacrum No change in wound care orders this week; continue Home Health for wound care. May utilize formulary equivalent dressing for wound treatment orders unless otherwise specified. - [facility] home health change three times a week all other days caregivers to change. Other Home Health Orders/Instructions: - [facility] Wound Treatment Wound #50 - Sacrum Cleanser: Wound Cleanser (Home Health) 1 x Per Day/30 Days Discharge Instructions: Cleanse the wound with wound cleanser prior to applying a clean dressing using gauze sponges, not tissue or cotton balls. Peri-Wound Care: Skin Prep (Home Health) 1 x Per Day/30 Days Discharge Instructions: Use skin prep as directed Peri-Wound Care: Zinc Oxide Ointment 30g tube (Home Health) 1 x Per Day/30 Days Discharge Instructions: Apply Zinc Oxide as needed for any maceration to periwound with each dressing change Prim Dressing: Dakin's Solution 0.25%, 16 (oz) (Home Health) 1 x Per Day/30 Days ary Discharge Instructions: Moisten gauze with Dakin's solution Secondary Dressing: Zetuvit Plus Silicone Border Dressing 5x5 (in/in) (Home Health) 1 x Per Day/30 Days Discharge Instructions: Apply silicone border over primary dressing as directed. Electronic Signature(s) Signed: 05/08/2021 11:11:07 AM By: Kalman Shan DO Entered By: Kalman Shan on 05/08/2021  11:09:30 -------------------------------------------------------------------------------- Problem List Details Patient Name: Date of Service: [name]. 05/08/2021 10:30 A M Medical Record Number: [id number] Patient Account Number: [id number] Date of Birth/Sex: Treating RN: 1961-02-16 (60 y.o. [name] Primary Care Provider: Latanya Presser Other Clinician: Referring Provider: Treating Provider/Extender: Wonda Amis, [name] in Treatment: 5 Active Problems ICD-10 Encounter Code Description Active Date MDM Diagnosis L89.153 Pressure ulcer of sacral region, stage 3 04/03/2021 No Yes S01.301A Unspecified open wound of right ear, initial encounter 04/03/2021 No Yes G80.8 Other cerebral palsy 04/03/2021 No Yes F79 Unspecified intellectual disabilities 04/03/2021 No Yes Z89.511 Acquired absence of right leg below knee 04/03/2021 No Yes Inactive Problems Resolved Problems Electronic Signature(s) Signed: 05/08/2021 11:11:07 AM By: Kalman Shan DO Entered By: Kalman Shan on 05/08/2021 11:07:33 -------------------------------------------------------------------------------- Progress Note Details Patient Name: Date of Service: [name] [name], [name] Gala. 05/08/2021 10:30 A M Medical Record Number: [id number] Patient Account Number: [id number] Date of Birth/Sex: Treating RN: 1961/01/16 (60 y.o. [name] Primary Care Provider: Latanya Presser Other Clinician: Referring Provider: Treating Provider/Extender: Wonda Amis, [name] in Treatment: 5 Subjective Chief Complaint Information obtained from Patient 04/03/2021: Sacral ulcer and right ear wound History of Present Illness (HPI) Admission 04/03/2021 Mr. 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 He states this developed while he was in the hospital in April for community- acquired pneumonia. He has been admitted to the hospital 4 times since April. On 12/06/2020 he was admitted for sepsis secondary to MRSA bacteremia from a stage IV decubitus ulcer. He had surgical debridement on 12/08/2020 by Dr. Kieth Brightly. He has been using Santyl ointment to the wound bed. He currently lives in a group home. He also has an open wound to his right ear that has been open for the past week. He lays on his right side consistently throughout the day while watching TV. This is a wound that has waxed and waned in the past depending on how often he watches TV. He denies signs of infection to either  wound site. 11/22; patient presents for 2-week follow-up. He reports that the sacral wound is more sore than usual. He currently denies fever/chills, nausea/vomiting or feeling unwell. 11/29; patient presents for 1 week follow-up. He has been taking his antibiotics without issues. He reports improvement to his ear wound. He had a sacral/coccyx x-ray last week. He currently denies signs of infection. 12/13; patient presents for 2 week follow-up. He has no issues or complaints today. He denies signs of infection. Patient History Information obtained from Patient. Family History Unknown History. Social History Never smoker, Marital Status - Single, Alcohol Use - Never, Drug Use - No History, Caffeine Use - Moderate. Medical History Eyes Denies history of Cataracts, Glaucoma, Optic Neuritis Ear/Nose/Mouth/Throat Denies history of Chronic sinus problems/congestion, Middle ear problems Hematologic/Lymphatic Patient has history of Anemia - microcytic Respiratory Denies history of Aspiration, Asthma, Chronic Obstructive Pulmonary Disease (COPD), Pneumothorax, Sleep Apnea, Tuberculosis Cardiovascular Patient has history of Deep Vein Thrombosis Denies history of Angina, Arrhythmia, Congestive Heart Failure,  Coronary Artery Disease, Hypertension, Hypotension, Myocardial Infarction, Peripheral Arterial Disease, Peripheral Venous Disease, Phlebitis, Vasculitis Gastrointestinal Patient has history of Colitis Denies history of Cirrhosis , Crohnoos, Hepatitis A, Hepatitis B, Hepatitis C Endocrine Denies history of Type I Diabetes, Type II Diabetes Genitourinary Denies history of End Stage Renal Disease Immunological Denies history of Lupus Erythematosus, Raynaudoos, Scleroderma Integumentary (Skin) Denies history of History of Burn Musculoskeletal Patient has history of Osteomyelitis Neurologic Patient has history of Neuropathy, Quadriplegia - spastic Oncologic Denies history of Received Chemotherapy, Received Radiation Psychiatric Patient has history of Confinement Anxiety Denies history of Anorexia/bulimia Hospitalization/Surgery History - surgery to straughten legs. 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 - hiatal hernia repair. - (R) gt toe amputation. - Constipation. 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 - Pneumonia. - left BKA 3//07/2020. - PNA 07/2020. - SNF from inpatient with PNA till 02/24/2021. Medical A Surgical History Notes nd Co-Morbid Conditions h/o spiral fracture distal (L) tibia , h/o shingles , MR , infantile CP Ear/Nose/Mouth/Throat hiatal hernia Respiratory h/o pneumonia , chronic bronchitis Cardiovascular hypercholesterolemia Gastrointestinal acute gastric ulcer with hemorrhage , constipation , GERD , Abdominal Distention Genitourinary incontinence (on Detrol) Renal Glycosuria Integumentary (Skin) Eczema Acne Vulgaris Onychomycosis Pressure Ulcers Musculoskeletal DJD of shoulder Neurologic cerebral palsy Psychiatric general anxiety , bipolar , depression, Mental Retardation Objective Constitutional respirations regular, non-labored and within target range for patient.. Vitals Time Taken: 10:48 AM, Temperature: 97.4 F, Pulse: 81 bpm, Respiratory Rate: 18 breaths/min,  Blood Pressure: 119/72 mmHg. Psychiatric pleasant and cooperative. General Notes: Sacrum: Open wound with granulation tissue and scant nonviable tissue. No surrounding signs of infection. Integumentary (Hair, Skin) Wound #50 status is Open. Original cause of wound was Pressure Injury. The date acquired was: 08/25/2020. The wound has been in treatment 5 weeks. The wound is located on the Sacrum. The wound measures 2.9cm length x 2.9cm width x 0.9cm depth; 6.605cm^2 area and 5.945cm^3 volume. There is Fat Layer (Subcutaneous Tissue) exposed. There is no tunneling or undermining noted. There is a medium amount of serosanguineous drainage noted. The wound margin is distinct with the outline attached to the wound base. There is medium (34-66%) red, pink granulation within the wound bed. There is a medium (34-66%) amount of necrotic tissue within the wound bed including Adherent Slough. Assessment Active Problems ICD-10 Pressure ulcer of sacral region, stage 3 Unspecified open wound of right ear, initial encounter Other cerebral palsy Unspecified intellectual disabilities Acquired absence of right leg below knee Patient's wound has shown improvement in  size and appearance since last clinic visit. I recommended continuing Dakin's wet-to-dry dressings. No signs of infection on exam. I recommended continuing to aggressively offload the area and repositioning every 1-2 hours. Follow-up in 3 weeks. Plan Follow-up Appointments: Return appointment in 3 weeks. - Dr. Heber Perkins ****HOYER 60 MINS**** Bathing/ Shower/ Hygiene: May shower with protection but do not get wound dressing(s) wet. Off-Loading: Turn and reposition every 2 hours - avoid direct pressure to sacrum Home Health: Wound #50 Sacrum: No change in wound care orders this week; continue Home Health for wound care. May utilize formulary equivalent dressing for wound treatment orders unless otherwise specified. - [facility] home health change three  times a week all other days caregivers to change. Other Home Health Orders/Instructions: - [facility] WOUND #50: - Sacrum Wound Laterality: Cleanser: Wound Cleanser (Home Health) 1 x Per Day/30 Days Discharge Instructions: Cleanse the wound with wound cleanser prior to applying a clean dressing using gauze sponges, not tissue or cotton balls. Peri-Wound Care: Skin Prep (Home Health) 1 x Per Day/30 Days Discharge Instructions: Use skin prep as directed Peri-Wound Care: Zinc Oxide Ointment 30g tube (Home Health) 1 x Per Day/30 Days Discharge Instructions: Apply Zinc Oxide as needed for any maceration to periwound with each dressing change Prim Dressing: Dakin's Solution 0.25%, 16 (oz) (Home Health) 1 x Per Day/30 Days ary Discharge Instructions: Moisten gauze with Dakin's solution Secondary Dressing: Zetuvit Plus Silicone Border Dressing 5x5 (in/in) (Home Health) 1 x Per Day/30 Days Discharge Instructions: Apply silicone border over primary dressing as directed. 1. Aggressive offloading 2. Dakin's wet-to-dry 3. Follow-up in 3 weeks Electronic Signature(s) Signed: 05/08/2021 11:11:07 AM By: Kalman Shan DO Entered By: Kalman Shan on 05/08/2021 11:10:27 -------------------------------------------------------------------------------- HxROS Details Patient Name: Date of Service: Kahan, [name] Gala. 05/08/2021 10:30 A M Medical Record Number: [id number] Patient Account Number: [id number] Date of Birth/Sex: Treating RN: 12/21/1960 (60 y.o. [name] Primary Care Provider: Latanya Presser Other Clinician: Referring Provider: Treating Provider/Extender: Wonda Amis, [name] in Treatment: 5 Information Obtained From Patient Co-Morbid Conditions Medical History: Past Medical History Notes: h/o spiral fracture distal (L) tibia , h/o shingles , MR , infantile CP Eyes Medical History: Negative for: Cataracts; Glaucoma; Optic  Neuritis Ear/Nose/Mouth/Throat Medical History: Negative for: Chronic sinus problems/congestion; Middle ear problems Past Medical History Notes: hiatal hernia Hematologic/Lymphatic Medical History: Positive for: Anemia - microcytic Respiratory Medical History: Negative for: Aspiration; Asthma; Chronic Obstructive Pulmonary Disease (COPD); Pneumothorax; Sleep Apnea; Tuberculosis Past Medical History Notes: h/o pneumonia , chronic bronchitis Cardiovascular Medical History: Positive for: Deep Vein Thrombosis Negative for: Angina; Arrhythmia; Congestive Heart Failure; Coronary Artery Disease; Hypertension; Hypotension; Myocardial Infarction; Peripheral Arterial Disease; Peripheral Venous Disease; Phlebitis; Vasculitis Past Medical History Notes: hypercholesterolemia Gastrointestinal Medical History: Positive for: Colitis Negative for: Cirrhosis ; Crohns; Hepatitis A; Hepatitis B; Hepatitis C Past Medical History Notes: acute gastric ulcer with hemorrhage , constipation , GERD , Abdominal Distention Endocrine Medical History: Negative for: Type I Diabetes; Type II Diabetes Genitourinary Medical History: Negative for: End Stage Renal Disease Past Medical History Notes: incontinence (on Detrol) Renal Glycosuria Immunological Medical History: Negative for: Lupus Erythematosus; Raynauds; Scleroderma Integumentary (Skin) Medical History: Negative for: History of Burn Past Medical History Notes: Eczema Acne Vulgaris Onychomycosis Pressure Ulcers Musculoskeletal Medical History: Positive for: Osteomyelitis Past Medical History Notes: DJD of shoulder Neurologic Medical History: Positive for: Neuropathy; Quadriplegia - spastic Past Medical History Notes: cerebral palsy Oncologic Medical History: Negative for: Received Chemotherapy; Received Radiation Psychiatric Medical History: Positive for: Confinement Anxiety  Negative for: Anorexia/bulimia Past Medical History  Notes: general anxiety , bipolar , depression, Mental Retardation Immunizations Pneumococcal Vaccine: Received Pneumococcal Vaccination: Yes Received Pneumococcal Vaccination On or After 60th Birthday: Yes Implantable Devices None Hospitalization / Surgery History Type of Hospitalization/Surgery surgery to straughten legs 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) hiatal hernia repair (R) gt toe amputation Constipation 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 Pneumonia left BKA 3//07/2020 PNA 07/2020 SNF from inpatient with PNA till 02/24/2021 Family and Social History Unknown History: Yes; Never smoker; Marital Status - Single; Alcohol Use: Never; Drug Use: No History; Caffeine Use: Moderate; Financial Concerns: No; Food, Clothing or Shelter Needs: No; Support System Lacking: No; Transportation Concerns: No Electronic Signature(s) Signed: 05/08/2021 11:11:07 AM By: Kalman Shan DO Signed: 05/08/2021 5:33:19 PM By: Deon Pilling RN, BSN Entered By: Kalman Shan on 05/08/2021 11:08:39 -------------------------------------------------------------------------------- SuperBill Details Patient Name: Date of Service: [name] 05/08/2021 Medical Record Number: [id number] Patient Account Number: [id number] Date of Birth/Sex: Treating RN: 22-Aug-1960 (60 y.o. [name] Primary Care Provider: Latanya Presser Other Clinician: Referring Provider: Treating Provider/Extender: Wonda Amis, [name] in Treatment: 5 Diagnosis Coding ICD-10 Codes Code Description [phone] Pressure ulcer of sacral region, stage 3 S01.301A Unspecified open wound of right ear, initial encounter G80.8 Other cerebral palsy F79 Unspecified intellectual disabilities Z89.511 Acquired absence of right leg below knee Facility Procedures CPT4 Code: [id number] Description: 99213 - WOUND CARE VISIT-LEV 3 EST PT Modifier: Quantity: 1 Physician Procedures : CPT4 Code Description Modifier [id number]  [id number] - WC PHYS LEVEL 3 - EST PT ICD-10 Diagnosis Description L89.153 Pressure ulcer of sacral region, stage 3 G80.8 Other cerebral palsy Quantity: 1 Electronic Signature(s) Signed: 05/10/2021 4:20:33 PM By: Kalman Shan DO Previous Signature: 05/08/2021 11:43:28 AM Version By: Kalman Shan DO Previous Signature: 05/08/2021 5:44:31 PM Version By: Baruch Gouty RN, BSN Previous Signature: 05/08/2021 11:11:07 AM Version By: Kalman Shan DO Entered By: Kalman Shan on 05/10/2021 16:20:19

## 2021-05-08 NOTE — Progress Notes (Signed)
Other Clinician: Referring Physician: Treating Physician/Extender: [name], [name] in Treatment: 5 Education Assessment Education Provided To: Patient Education  Topics Provided Pressure: Methods: Explain/Verbal Responses: Reinforcements needed, State content correctly Wound/Skin Impairment: Methods: Explain/Verbal Responses: Reinforcements needed, State content correctly Electronic Signature(s) Signed: 12/[name]/2022 5:44:31 PM By: [name] Gouty RN, BSN Entered By: [name] on 12/[name]/2022 11:04:53 -------------------------------------------------------------------------------- Wound Assessment Details Patient Name: Date of Service: [name], [name] 12/[name]/2022 10:30 A M Medical Record Number: [id number] Patient Account Number: [id number] Date of Birth/Sex: Treating RN: [name][birth year] (60 y.o. [name] Primary Care [name]: [name] Other Clinician: Referring [name]: Treating [name]/Extender: [name], [name] in Treatment: 5 Wound Status Wound Number: 50 Primary Pressure Ulcer Etiology: Wound Location: Sacrum Wound Open Wounding Event: Pressure Injury Status: Date Acquired: 08/25/2020 Comorbid Anemia, Deep Vein Thrombosis, Colitis, Osteomyelitis, [name] Of Treatment: 5 History: Neuropathy, Quadriplegia, Confinement Anxiety Clustered Wound: No Wound Measurements Length: (cm) 2.9 Width: (cm) 2.9 Depth: (cm) 0.9 Area: (cm) 6.605 Volume: (cm) 5.945 % Reduction in Area: 28.5% % Reduction in Volume: -7.3% Epithelialization: None Tunneling: No Undermining: No Wound Description Classification: Category/Stage III Wound Margin: Distinct, outline attached Exudate Amount: Medium Exudate Type: Serosanguineous Exudate Color: red, brown Foul Odor After Cleansing: No Slough/Fibrino Yes Wound Bed Granulation Amount: Medium (34-66%) Exposed Structure Granulation Quality: Red, Pink Fascia Exposed: No Necrotic Amount: Medium (34-66%) Fat Layer (Subcutaneous Tissue) Exposed: Yes Necrotic Quality: Adherent Slough Tendon Exposed: No Muscle Exposed: No Joint Exposed: No Bone Exposed:  No Treatment Notes Wound #50 (Sacrum) Cleanser Wound Cleanser Discharge Instruction: Cleanse the wound with wound cleanser prior to applying a clean dressing using gauze sponges, not tissue or cotton balls. Peri-Wound Care Skin Prep Discharge Instruction: Use skin prep as directed Zinc Oxide Ointment 30g tube Discharge Instruction: Apply Zinc Oxide as needed for any maceration to periwound with each dressing change Topical Primary Dressing Dakin's Solution 0.[name]%, 16 (oz) Discharge Instruction: Moisten gauze with Dakin's solution Secondary Dressing Zetuvit Plus Silicone Border Dressing 5x5 (in/in) Discharge Instruction: Apply silicone border over primary dressing as directed. Secured With Compression Wrap Compression Stockings Environmental education officer) Signed: 12/[name]/2022 5:44:31 PM By: [name] Gouty RN, BSN Entered By: [name] on 12/[name]/2022 11:01:31 -------------------------------------------------------------------------------- Vitals Details Patient Name: Date of Service: [name] Birchwood. 12/[name]/2022 10:30 A M Medical Record Number: [id number] Patient Account Number: [id number] Date of Birth/Sex: Treating RN: [name] (60 y.o. [name] Primary Care [name]: [name] Other Clinician: Referring [name]: Treating [name]/Extender: [name], [name] in Treatment: 5 Vital Signs Time Taken: 10:48 Temperature (F): 97.4 Pulse (bpm): 81 Respiratory Rate (breaths/min): 18 Blood Pressure (mmHg): 119/72 Reference Range: 80 - 120 mg / dl Electronic Signature(s) Signed: 12/[name]/2022 5:44:31 PM By: [name] Gouty RN, BSN Entered By: [name] on 12/[name]/2022 10:50:09  LEOTHA, VOELTZ ([id number]) Visit Report for 12/[name]/2022 Arrival Information Details Patient Name: Date of Service: [name], [name] 12/[name]/2022 10:30 A M Medical Record Number: [id number] Patient Account Number: [id number] Date of Birth/Sex: Treating RN: [name] (60 y.o. [name] Primary Care [name]: [name] Other Clinician: Referring Cate Oravec: Treating Jinelle Butchko/Extender: [name], [name] in Treatment: 5 Visit Information History Since Last Visit Added or deleted any medications: No Patient Arrived: Wheel Chair Any new allergies or adverse reactions: No Arrival Time: 10:47 Had a fall or experienced change in No Accompanied By: [name] activities of daily living that [name] affect Transfer Assistance: [name] Lift risk of falls: Patient Identification Verified: Yes Signs or symptoms of abuse/neglect since last visito No Secondary Verification Process Completed: Yes Hospitalized since last visit: No Patient Requires Transmission-Based Precautions: No Implantable device outside of the clinic excluding No Patient Has Alerts: Yes cellular tissue based products placed in the center since last visit: Has Dressing in Place as Prescribed: Yes Pain Present Now: Yes Electronic Signature(s) Signed: 12/[name]/2022 5:44:31 PM By: [name] Gouty RN, BSN Entered By: [name] on 12/[name]/2022 10:48:41 -------------------------------------------------------------------------------- Clinic Level of Care Assessment Details Patient Name: Date of Service: [name], [name] 12/[name]/2022 10:30 A M Medical Record Number: [id number] Patient Account Number: [id number] Date of Birth/Sex: Treating RN: [name], [name] (60 y.o. [name] Primary Care Denica Web: [name] Other Clinician: Referring Jaimon Bugaj: Treating Laurielle Selmon/Extender: [name], [name] in Treatment: 5 Clinic Level of Care Assessment Items TOOL 4 Quantity  Score []  - 0 Use when only an EandM is performed on FOLLOW-UP visit ASSESSMENTS - Nursing Assessment / Reassessment X- 1 10 Reassessment of Co-morbidities (includes updates in patient status) X- 1 5 Reassessment of Adherence to Treatment Plan ASSESSMENTS - Wound and Skin A ssessment / Reassessment X - Simple Wound Assessment / Reassessment - one wound 1 5 []  - 0 Complex Wound Assessment / Reassessment - multiple wounds []  - 0 Dermatologic / Skin Assessment (not related to wound area) ASSESSMENTS - Focused Assessment []  - 0 Circumferential Edema Measurements - multi extremities []  - 0 Nutritional Assessment / Counseling / Intervention []  - 0 Lower Extremity Assessment (monofilament, tuning fork, pulses) []  - 0 Peripheral Arterial Disease Assessment (using hand held doppler) ASSESSMENTS - Ostomy and/or Continence Assessment and Care []  - 0 Incontinence Assessment and Management []  - 0 Ostomy Care Assessment and Management (repouching, etc.) PROCESS - Coordination of Care X - Simple Patient / Family Education for ongoing care 1 15 []  - 0 Complex (extensive) Patient / Family Education for ongoing care X- 1 10 Staff obtains Programmer, systems, Records, T Results / Process Orders est X- 1 10 Staff telephones HHA, Nursing Homes / Clarify orders / etc []  - 0 Routine Transfer to another Facility (non-emergent condition) []  - 0 Routine Hospital Admission (non-emergent condition) []  - 0 New Admissions / Biomedical engineer / Ordering NPWT Apligraf, etc. , []  - 0 Emergency Hospital Admission (emergent condition) X- 1 10 Simple Discharge Coordination []  - 0 Complex (extensive) Discharge Coordination PROCESS - Special Needs []  - 0 Pediatric / Minor Patient Management []  - 0 Isolation Patient Management []  - 0 Hearing / Language / Visual special needs []  - 0 Assessment of Community assistance (transportation, D/C planning, etc.) []  - 0 Additional assistance / Altered  mentation []  - 0 Support Surface(s) Assessment (bed, cushion, seat, etc.) INTERVENTIONS - Wound Cleansing / Measurement X - Simple Wound Cleansing - one wound 1 5 []  - 0 Complex Wound Cleansing -  Layer (Subcutaneous Tissue): Yes N/A] Exposed Structures: [50:Fascia: No Tendon: No Muscle: No Joint: No Bone: No None] [N/A:N/A] Treatment Notes Electronic Signature(s) Signed: 12/[name]/2022 11:11:07 AM By: Kalman Shan DO Signed: 12/[name]/2022 5:33:19 PM By: Deon Pilling RN, BSN Entered By: Kalman Shan on 12/[name]/2022 11:07:38 -------------------------------------------------------------------------------- Multi-Disciplinary Care Plan Details Patient Name: Date of Service: [name] Birchwood. 12/[name]/2022 10:30 A M Medical Record Number: [id number] Patient Account Number: [id number] Date of Birth/Sex: Treating RN: [name], [name] (60 y.o. [name] Primary Care Eriyah Fernando: [name] Other Clinician: Referring Tavaris Eudy: Treating Keng Jewel/Extender: Jones Skene [name] in Treatment: [place] reviewed with physician Active Inactive Abuse / Safety / Falls / Self Care Management Nursing Diagnoses: Potential for falls Potential for injury related to falls Goals: Patient will remain injury free  related to falls Date Initiated: 04/03/2021 Target Resolution Date: 06/05/2021 Goal Status: Active Patient/[name] will verbalize understanding of skin care regimen Date Initiated: 04/03/2021 Target Resolution Date: 06/05/2021 Goal Status: Active Interventions: Assess fall risk on admission and as needed Assess: immobility, friction, shearing, incontinence upon admission and as needed Assess impairment of mobility on admission and as needed per policy Provide education on fall prevention Notes: Pain, Acute or Chronic Nursing Diagnoses: Pain, acute or chronic: actual or potential Potential alteration in comfort, pain Goals: Patient will verbalize adequate pain control and receive pain control interventions during procedures as needed Date Initiated: 04/03/2021 Target Resolution Date: 06/05/2021 Goal Status: Active Patient/[name] will verbalize comfort level met Date Initiated: 04/03/2021 Target Resolution Date: 06/05/2021 Goal Status: Active Interventions: Encourage patient to take pain medications as prescribed Provide education on pain management Reposition patient for comfort Treatment Activities: Administer pain control measures as ordered : 04/03/2021 Notes: Wound/Skin Impairment Nursing Diagnoses: Knowledge deficit related to ulceration/compromised skin integrity Goals: Patient/[name] will verbalize understanding of skin care regimen Date Initiated: 04/03/2021 Target Resolution Date: 06/05/2021 Goal Status: Active Interventions: Assess patient/[name] ability to perform ulcer/skin care regimen upon admission and as needed Assess ulceration(s) every visit Provide education on ulcer and skin care Treatment Activities: Skin care regimen initiated : 04/03/2021 Topical wound management initiated : 04/03/2021 Notes: Electronic Signature(s) Signed: 12/[name]/2022 5:44:31 PM By: [name] Gouty RN, BSN Entered By: [name] on 12/[name]/2022  11:03:46 -------------------------------------------------------------------------------- Pain Assessment Details Patient Name: Date of Service: [name], [name] 12/[name]/2022 10:30 A M Medical Record Number: [id number] Patient Account Number: [id number] Date of Birth/Sex: Treating RN: [name]-[birth year] (60 y.o. [name] Primary Care Len Azeez: [name] Other Clinician: Referring Montrel Donahoe: Treating Trey Bebee/Extender: [name], [name] in Treatment: 5 Active Problems Location of Pain Severity and Description of Pain Patient Has Paino Yes Site Locations Pain Location: Pain in Ulcers With Dressing Change: Yes Duration of the Pain. Constant / Intermittento Intermittent Rate the pain. Current Pain Level: 7 Least Pain Level: 0 Character of Pain Describe the Pain: Aching Pain Management and Medication Current Pain Management: Medication: Yes Other: [email or web address] Is the Current Pain Management Adequate: Adequate How does your wound impact your activities of daily livingo Sleep: No Bathing: No Appetite: No Relationship With Others: No Bladder Continence: No Emotions: No Bowel Continence: No Work: No Toileting: No Drive: No Dressing: No Hobbies: No Electronic Signature(s) Signed: 12/[name]/2022 5:44:31 PM By: [name] Gouty RN, BSN Entered By: [name] on 12/[name]/2022 10:49:37 -------------------------------------------------------------------------------- Patient/[name] Education Details Patient Name: Date of Service: [name] Birchwood 12/[name]/2022andnbsp10:30 A M Medical Record Number: [id number] Patient Account Number: [id number] Date of Birth/Gender: Treating RN: [name], [name] (60 y.o. [name] Primary Care Physician: [name]  Other Clinician: Referring Physician: Treating Physician/Extender: [name], [name] in Treatment: 5 Education Assessment Education Provided To: Patient Education  Topics Provided Pressure: Methods: Explain/Verbal Responses: Reinforcements needed, State content correctly Wound/Skin Impairment: Methods: Explain/Verbal Responses: Reinforcements needed, State content correctly Electronic Signature(s) Signed: 12/[name]/2022 5:44:31 PM By: [name] Gouty RN, BSN Entered By: [name] on 12/[name]/2022 11:04:53 -------------------------------------------------------------------------------- Wound Assessment Details Patient Name: Date of Service: [name], [name] 12/[name]/2022 10:30 A M Medical Record Number: [id number] Patient Account Number: [id number] Date of Birth/Sex: Treating RN: [name][birth year] (60 y.o. [name] Primary Care [name]: [name] Other Clinician: Referring [name]: Treating [name]/Extender: [name], [name] in Treatment: 5 Wound Status Wound Number: 50 Primary Pressure Ulcer Etiology: Wound Location: Sacrum Wound Open Wounding Event: Pressure Injury Status: Date Acquired: 08/25/2020 Comorbid Anemia, Deep Vein Thrombosis, Colitis, Osteomyelitis, [name] Of Treatment: 5 History: Neuropathy, Quadriplegia, Confinement Anxiety Clustered Wound: No Wound Measurements Length: (cm) 2.9 Width: (cm) 2.9 Depth: (cm) 0.9 Area: (cm) 6.605 Volume: (cm) 5.945 % Reduction in Area: 28.5% % Reduction in Volume: -7.3% Epithelialization: None Tunneling: No Undermining: No Wound Description Classification: Category/Stage III Wound Margin: Distinct, outline attached Exudate Amount: Medium Exudate Type: Serosanguineous Exudate Color: red, brown Foul Odor After Cleansing: No Slough/Fibrino Yes Wound Bed Granulation Amount: Medium (34-66%) Exposed Structure Granulation Quality: Red, Pink Fascia Exposed: No Necrotic Amount: Medium (34-66%) Fat Layer (Subcutaneous Tissue) Exposed: Yes Necrotic Quality: Adherent Slough Tendon Exposed: No Muscle Exposed: No Joint Exposed: No Bone Exposed:  No Treatment Notes Wound #50 (Sacrum) Cleanser Wound Cleanser Discharge Instruction: Cleanse the wound with wound cleanser prior to applying a clean dressing using gauze sponges, not tissue or cotton balls. Peri-Wound Care Skin Prep Discharge Instruction: Use skin prep as directed Zinc Oxide Ointment 30g tube Discharge Instruction: Apply Zinc Oxide as needed for any maceration to periwound with each dressing change Topical Primary Dressing Dakin's Solution 0.[name]%, 16 (oz) Discharge Instruction: Moisten gauze with Dakin's solution Secondary Dressing Zetuvit Plus Silicone Border Dressing 5x5 (in/in) Discharge Instruction: Apply silicone border over primary dressing as directed. Secured With Compression Wrap Compression Stockings Environmental education officer) Signed: 12/[name]/2022 5:44:31 PM By: [name] Gouty RN, BSN Entered By: [name] on 12/[name]/2022 11:01:31 -------------------------------------------------------------------------------- Vitals Details Patient Name: Date of Service: [name] Birchwood. 12/[name]/2022 10:30 A M Medical Record Number: [id number] Patient Account Number: [id number] Date of Birth/Sex: Treating RN: [name] (60 y.o. [name] Primary Care [name]: [name] Other Clinician: Referring [name]: Treating [name]/Extender: [name], [name] in Treatment: 5 Vital Signs Time Taken: 10:48 Temperature (F): 97.4 Pulse (bpm): 81 Respiratory Rate (breaths/min): 18 Blood Pressure (mmHg): 119/72 Reference Range: 80 - 120 mg / dl Electronic Signature(s) Signed: 12/[name]/2022 5:44:31 PM By: [name] Gouty RN, BSN Entered By: [name] on 12/[name]/2022 10:50:09

## 2021-05-10 ENCOUNTER — Telehealth: Payer: Self-pay

## 2021-05-10 NOTE — Telephone Encounter (Signed)
Phone call placed to patient's home. Spoke with caregivers to follow up from volunteer call report that patient is in pain. Spoke with caregivers who shared that patient is not demonstrating pain except to sacral area. Patient has tylenol available. Scheduled visit with Dr. Renato Gails for 05/24/21. Encouraged to call  with any further concerns.

## 2021-05-15 ENCOUNTER — Encounter (HOSPITAL_BASED_OUTPATIENT_CLINIC_OR_DEPARTMENT_OTHER): Payer: Medicare Other | Admitting: Internal Medicine

## 2021-05-22 ENCOUNTER — Inpatient Hospital Stay (HOSPITAL_COMMUNITY)
Admission: EM | Admit: 2021-05-22 | Discharge: 2021-06-21 | DRG: 177 | Disposition: A | Discharge status: Hospice | Payer: Medicare Other | Attending: Internal Medicine | Admitting: Internal Medicine

## 2021-05-22 ENCOUNTER — Emergency Department (HOSPITAL_COMMUNITY): Payer: Medicare Other

## 2021-05-22 ENCOUNTER — Observation Stay (HOSPITAL_COMMUNITY): Payer: Medicare Other

## 2021-05-22 ENCOUNTER — Encounter (HOSPITAL_COMMUNITY): Payer: Self-pay | Admitting: Emergency Medicine

## 2021-05-22 DIAGNOSIS — E7220 Disorder of urea cycle metabolism, unspecified: Secondary | ICD-10-CM | POA: Diagnosis present

## 2021-05-22 DIAGNOSIS — R509 Fever, unspecified: Secondary | ICD-10-CM

## 2021-05-22 DIAGNOSIS — E559 Vitamin D deficiency, unspecified: Secondary | ICD-10-CM | POA: Diagnosis present

## 2021-05-22 DIAGNOSIS — Z79899 Other long term (current) drug therapy: Secondary | ICD-10-CM

## 2021-05-22 DIAGNOSIS — Z993 Dependence on wheelchair: Secondary | ICD-10-CM

## 2021-05-22 DIAGNOSIS — R109 Unspecified abdominal pain: Secondary | ICD-10-CM

## 2021-05-22 DIAGNOSIS — J690 Pneumonitis due to inhalation of food and vomit: Secondary | ICD-10-CM | POA: Diagnosis not present

## 2021-05-22 DIAGNOSIS — R1310 Dysphagia, unspecified: Secondary | ICD-10-CM

## 2021-05-22 DIAGNOSIS — L89899 Pressure ulcer of other site, unspecified stage: Secondary | ICD-10-CM | POA: Diagnosis present

## 2021-05-22 DIAGNOSIS — Z6830 Body mass index (BMI) 30.0-30.9, adult: Secondary | ICD-10-CM

## 2021-05-22 DIAGNOSIS — R140 Abdominal distension (gaseous): Secondary | ICD-10-CM

## 2021-05-22 DIAGNOSIS — R0902 Hypoxemia: Secondary | ICD-10-CM

## 2021-05-22 DIAGNOSIS — N139 Obstructive and reflux uropathy, unspecified: Secondary | ICD-10-CM | POA: Diagnosis present

## 2021-05-22 DIAGNOSIS — K219 Gastro-esophageal reflux disease without esophagitis: Secondary | ICD-10-CM | POA: Diagnosis present

## 2021-05-22 DIAGNOSIS — F419 Anxiety disorder, unspecified: Secondary | ICD-10-CM | POA: Diagnosis present

## 2021-05-22 DIAGNOSIS — G8929 Other chronic pain: Secondary | ICD-10-CM | POA: Diagnosis present

## 2021-05-22 DIAGNOSIS — G808 Other cerebral palsy: Secondary | ICD-10-CM | POA: Diagnosis present

## 2021-05-22 DIAGNOSIS — R7303 Prediabetes: Secondary | ICD-10-CM | POA: Diagnosis present

## 2021-05-22 DIAGNOSIS — E872 Acidosis, unspecified: Secondary | ICD-10-CM | POA: Diagnosis present

## 2021-05-22 DIAGNOSIS — F429 Obsessive-compulsive disorder, unspecified: Secondary | ICD-10-CM | POA: Diagnosis present

## 2021-05-22 DIAGNOSIS — K5909 Other constipation: Secondary | ICD-10-CM | POA: Diagnosis not present

## 2021-05-22 DIAGNOSIS — R188 Other ascites: Secondary | ICD-10-CM

## 2021-05-22 DIAGNOSIS — R059 Cough, unspecified: Secondary | ICD-10-CM | POA: Diagnosis not present

## 2021-05-22 DIAGNOSIS — I495 Sick sinus syndrome: Secondary | ICD-10-CM | POA: Diagnosis not present

## 2021-05-22 DIAGNOSIS — Z515 Encounter for palliative care: Secondary | ICD-10-CM

## 2021-05-22 DIAGNOSIS — F79 Unspecified intellectual disabilities: Secondary | ICD-10-CM | POA: Diagnosis present

## 2021-05-22 DIAGNOSIS — I82890 Acute embolism and thrombosis of other specified veins: Secondary | ICD-10-CM | POA: Diagnosis not present

## 2021-05-22 DIAGNOSIS — Z86718 Personal history of other venous thrombosis and embolism: Secondary | ICD-10-CM

## 2021-05-22 DIAGNOSIS — E43 Unspecified severe protein-calorie malnutrition: Secondary | ICD-10-CM | POA: Diagnosis present

## 2021-05-22 DIAGNOSIS — Z9889 Other specified postprocedural states: Secondary | ICD-10-CM

## 2021-05-22 DIAGNOSIS — K5900 Constipation, unspecified: Secondary | ICD-10-CM | POA: Diagnosis present

## 2021-05-22 DIAGNOSIS — E8809 Other disorders of plasma-protein metabolism, not elsewhere classified: Secondary | ICD-10-CM | POA: Diagnosis present

## 2021-05-22 DIAGNOSIS — Z20822 Contact with and (suspected) exposure to covid-19: Secondary | ICD-10-CM | POA: Diagnosis present

## 2021-05-22 DIAGNOSIS — K55069 Acute infarction of intestine, part and extent unspecified: Secondary | ICD-10-CM | POA: Diagnosis not present

## 2021-05-22 DIAGNOSIS — D75838 Other thrombocytosis: Secondary | ICD-10-CM | POA: Diagnosis not present

## 2021-05-22 DIAGNOSIS — F6381 Intermittent explosive disorder: Secondary | ICD-10-CM | POA: Diagnosis present

## 2021-05-22 DIAGNOSIS — E440 Moderate protein-calorie malnutrition: Secondary | ICD-10-CM | POA: Insufficient documentation

## 2021-05-22 DIAGNOSIS — F319 Bipolar disorder, unspecified: Secondary | ICD-10-CM | POA: Diagnosis present

## 2021-05-22 DIAGNOSIS — Z66 Do not resuscitate: Secondary | ICD-10-CM | POA: Diagnosis present

## 2021-05-22 DIAGNOSIS — R0600 Dyspnea, unspecified: Secondary | ICD-10-CM | POA: Diagnosis present

## 2021-05-22 DIAGNOSIS — E7800 Pure hypercholesterolemia, unspecified: Secondary | ICD-10-CM | POA: Diagnosis present

## 2021-05-22 DIAGNOSIS — Z8711 Personal history of peptic ulcer disease: Secondary | ICD-10-CM

## 2021-05-22 DIAGNOSIS — J90 Pleural effusion, not elsewhere classified: Secondary | ICD-10-CM

## 2021-05-22 DIAGNOSIS — I5032 Chronic diastolic (congestive) heart failure: Secondary | ICD-10-CM | POA: Diagnosis present

## 2021-05-22 DIAGNOSIS — I81 Portal vein thrombosis: Secondary | ICD-10-CM | POA: Diagnosis present

## 2021-05-22 DIAGNOSIS — I5033 Acute on chronic diastolic (congestive) heart failure: Secondary | ICD-10-CM

## 2021-05-22 DIAGNOSIS — R532 Functional quadriplegia: Secondary | ICD-10-CM | POA: Diagnosis present

## 2021-05-22 DIAGNOSIS — L89153 Pressure ulcer of sacral region, stage 3: Secondary | ICD-10-CM | POA: Diagnosis present

## 2021-05-22 DIAGNOSIS — D509 Iron deficiency anemia, unspecified: Secondary | ICD-10-CM | POA: Diagnosis present

## 2021-05-22 DIAGNOSIS — I509 Heart failure, unspecified: Secondary | ICD-10-CM

## 2021-05-22 DIAGNOSIS — G809 Cerebral palsy, unspecified: Secondary | ICD-10-CM | POA: Diagnosis present

## 2021-05-22 DIAGNOSIS — I471 Supraventricular tachycardia: Secondary | ICD-10-CM | POA: Diagnosis not present

## 2021-05-22 DIAGNOSIS — J189 Pneumonia, unspecified organism: Secondary | ICD-10-CM

## 2021-05-22 DIAGNOSIS — Z888 Allergy status to other drugs, medicaments and biological substances status: Secondary | ICD-10-CM

## 2021-05-22 DIAGNOSIS — Z89511 Acquired absence of right leg below knee: Secondary | ICD-10-CM

## 2021-05-22 DIAGNOSIS — L98429 Non-pressure chronic ulcer of back with unspecified severity: Secondary | ICD-10-CM | POA: Diagnosis present

## 2021-05-22 DIAGNOSIS — I442 Atrioventricular block, complete: Secondary | ICD-10-CM | POA: Diagnosis not present

## 2021-05-22 HISTORY — DX: Acute on chronic diastolic (congestive) heart failure: I50.33

## 2021-05-22 LAB — COMPREHENSIVE METABOLIC PANEL
ALT: 23 U/L (ref 0–44)
AST: 29 U/L (ref 15–41)
Albumin: 2.4 g/dL — ABNORMAL LOW (ref 3.5–5.0)
Alkaline Phosphatase: 132 U/L — ABNORMAL HIGH (ref 38–126)
Anion gap: 9 (ref 5–15)
BUN: 18 mg/dL (ref 8–23)
CO2: 22 mmol/L (ref 22–32)
Calcium: 8.6 mg/dL — ABNORMAL LOW (ref 8.9–10.3)
Chloride: 105 mmol/L (ref 98–111)
Creatinine, Ser: 0.41 mg/dL — ABNORMAL LOW (ref 0.61–1.24)
GFR, Estimated: >60 mL/min (ref >60)
Glucose, Bld: 116 mg/dL — ABNORMAL HIGH (ref 70–99)
Potassium: 4 mmol/L (ref 3.5–5.1)
Sodium: 136 mmol/L (ref 135–145)
Total Bilirubin: 0.5 mg/dL (ref 0.3–1.2)
Total Protein: 6.9 g/dL (ref 6.5–8.1)

## 2021-05-22 LAB — GLUCOSE, CAPILLARY: Glucose-Capillary: 93 mg/dL (ref 70–99)

## 2021-05-22 LAB — CBC WITH DIFFERENTIAL/PLATELET
Abs Immature Granulocytes: 0.13 10*3/uL — ABNORMAL HIGH (ref 0.00–0.07)
Basophils Absolute: 0.1 10*3/uL (ref 0.0–0.1)
Basophils Relative: 0 %
Eosinophils Absolute: 0 10*3/uL (ref 0.0–0.5)
Eosinophils Relative: 0 %
HCT: 34.1 % — ABNORMAL LOW (ref 39.0–52.0)
Hemoglobin: 10.6 g/dL — ABNORMAL LOW (ref 13.0–17.0)
Immature Granulocytes: 1 %
Lymphocytes Relative: 14 %
Lymphs Abs: 1.7 10*3/uL (ref 0.7–4.0)
MCH: 23.3 pg — ABNORMAL LOW (ref 26.0–34.0)
MCHC: 31.1 g/dL (ref 30.0–36.0)
MCV: 75.1 fL — ABNORMAL LOW (ref 80.0–100.0)
Monocytes Absolute: 1.7 10*3/uL — ABNORMAL HIGH (ref 0.1–1.0)
Monocytes Relative: 14 %
Neutro Abs: 8.8 10*3/uL — ABNORMAL HIGH (ref 1.7–7.7)
Neutrophils Relative %: 71 %
Platelets: 359 10*3/uL (ref 150–400)
RBC: 4.54 MIL/uL (ref 4.22–5.81)
RDW: 19.1 % — ABNORMAL HIGH (ref 11.5–15.5)
WBC: 12.4 10*3/uL — ABNORMAL HIGH (ref 4.0–10.5)
nRBC: 0 % (ref 0.0–0.2)

## 2021-05-22 LAB — BRAIN NATRIURETIC PEPTIDE: B Natriuretic Peptide: 33.6 pg/mL (ref 0.0–100.0)

## 2021-05-22 LAB — RESP PANEL BY RT-PCR (FLU A&B, COVID) ARPGX2
Influenza A by PCR: NEGATIVE
Influenza B by PCR: NEGATIVE
SARS Coronavirus 2 by RT PCR: NEGATIVE

## 2021-05-22 LAB — LACTIC ACID, PLASMA: Lactic Acid, Venous: 2.8 mmol/L (ref 0.5–1.9)

## 2021-05-22 LAB — TROPONIN I (HIGH SENSITIVITY)
Troponin I (High Sensitivity): 5 ng/L (ref <18)
Troponin I (High Sensitivity): 5 ng/L (ref <18)

## 2021-05-22 LAB — PROCALCITONIN: Procalcitonin: <0.1 ng/mL

## 2021-05-22 MED ORDER — ACETAMINOPHEN 325 MG PO TABS
650.0000 mg | ORAL_TABLET | Freq: Four times a day (QID) | ORAL | Status: DC | PRN
  Administered 2021-05-24 – 2021-06-05 (×6): 650 mg via ORAL
  Filled 2021-05-22 (×8): qty 2

## 2021-05-22 MED ORDER — LACTATED RINGERS IV SOLN: INTRAVENOUS | Status: AC

## 2021-05-22 MED ORDER — GUAIFENESIN-DM 100-10 MG/5ML PO SYRP: 10.0000 mL | ORAL_SOLUTION | ORAL | Status: DC | PRN

## 2021-05-22 MED ORDER — BISACODYL 5 MG PO TBEC
5.0000 mg | DELAYED_RELEASE_TABLET | Freq: Every day | ORAL | Status: DC | PRN
  Administered 2021-05-28 – 2021-06-10 (×5): 5 mg via ORAL
  Filled 2021-05-22 (×6): qty 1

## 2021-05-22 MED ORDER — ENOXAPARIN SODIUM 40 MG/0.4ML IJ SOSY
40.0000 mg | PREFILLED_SYRINGE | INTRAMUSCULAR | Status: DC
Start: 2021-05-22 — End: 2021-06-04
  Administered 2021-05-22 – 2021-06-03 (×13): 40 mg via SUBCUTANEOUS
  Filled 2021-05-22 (×13): qty 0.4

## 2021-05-22 MED ORDER — BACLOFEN 20 MG PO TABS
20.0000 mg | ORAL_TABLET | Freq: Three times a day (TID) | ORAL | Status: DC
  Administered 2021-05-22 – 2021-06-05 (×42): 20 mg via ORAL
  Filled 2021-05-22 (×43): qty 1

## 2021-05-22 MED ORDER — POLYSACCHARIDE IRON COMPLEX 150 MG PO CAPS
150.0000 mg | ORAL_CAPSULE | Freq: Every day | ORAL | Status: DC
  Administered 2021-05-23 – 2021-06-05 (×14): 150 mg via ORAL
  Filled 2021-05-22 (×14): qty 1

## 2021-05-22 MED ORDER — HYDROCORTISONE 1 % EX CREA
1.0000 "application " | TOPICAL_CREAM | Freq: Two times a day (BID) | CUTANEOUS | Status: DC
  Administered 2021-05-22 – 2021-06-21 (×56): 1 via TOPICAL
  Filled 2021-05-22: qty 28

## 2021-05-22 MED ORDER — SODIUM CHLORIDE 0.9 % IV SOLN
500.0000 mg | INTRAVENOUS | Status: DC
  Administered 2021-05-23: 16:00:00 500 mg via INTRAVENOUS
  Filled 2021-05-22 (×2): qty 5

## 2021-05-22 MED ORDER — LACTULOSE 10 GM/15ML PO SOLN
15.0000 g | Freq: Every day | ORAL | Status: DC
  Administered 2021-05-22 – 2021-06-03 (×12): 15 g via ORAL
  Filled 2021-05-22 (×13): qty 30

## 2021-05-22 MED ORDER — LORAZEPAM 1 MG PO TABS
1.0000 mg | ORAL_TABLET | ORAL | Status: DC | PRN
  Administered 2021-06-04 – 2021-06-05 (×3): 1 mg via ORAL
  Filled 2021-05-22 (×4): qty 1

## 2021-05-22 MED ORDER — LORATADINE 10 MG PO TABS
10.0000 mg | ORAL_TABLET | Freq: Every day | ORAL | Status: DC
  Administered 2021-05-22 – 2021-06-05 (×15): 10 mg via ORAL
  Filled 2021-05-22 (×15): qty 1

## 2021-05-22 MED ORDER — FESOTERODINE FUMARATE ER 4 MG PO TB24
4.0000 mg | ORAL_TABLET | Freq: Every day | ORAL | Status: DC
  Administered 2021-05-22 – 2021-05-31 (×10): 4 mg via ORAL
  Filled 2021-05-22 (×10): qty 1

## 2021-05-22 MED ORDER — HYDROMORPHONE HCL 1 MG/ML IJ SOLN
1.0000 mg | Freq: Once | INTRAMUSCULAR | Status: AC
  Administered 2021-05-22: 20:00:00 1 mg via INTRAVENOUS
  Filled 2021-05-22: qty 1

## 2021-05-22 MED ORDER — SENNOSIDES-DOCUSATE SODIUM 8.6-50 MG PO TABS
1.0000 | ORAL_TABLET | Freq: Every day | ORAL | Status: DC
  Administered 2021-05-22 – 2021-05-24 (×3): 1 via ORAL
  Filled 2021-05-22 (×3): qty 1

## 2021-05-22 MED ORDER — ZINC OXIDE 40 % EX OINT: 1.0000 "application " | TOPICAL_OINTMENT | Freq: Two times a day (BID) | CUTANEOUS | Status: DC | PRN

## 2021-05-22 MED ORDER — SODIUM CHLORIDE 0.9 % IV SOLN
1.0000 g | INTRAVENOUS | Status: AC
  Administered 2021-05-23 – 2021-05-27 (×5): 1 g via INTRAVENOUS
  Filled 2021-05-22 (×6): qty 10

## 2021-05-22 MED ORDER — ENSURE ENLIVE PO LIQD
237.0000 mL | Freq: Two times a day (BID) | ORAL | Status: DC
  Administered 2021-05-23 – 2021-06-05 (×22): 237 mL via ORAL
  Filled 2021-05-22 (×31): qty 237

## 2021-05-22 MED ORDER — OYSTER SHELL 500 MG PO TABS: 1500.0000 mg | ORAL_TABLET | Freq: Every day | ORAL | Status: DC

## 2021-05-22 MED ORDER — ASCORBIC ACID 500 MG PO TABS
250.0000 mg | ORAL_TABLET | Freq: Every day | ORAL | Status: DC
  Administered 2021-05-23 – 2021-06-05 (×14): 250 mg via ORAL
  Filled 2021-05-22 (×14): qty 1

## 2021-05-22 MED ORDER — PANTOPRAZOLE SODIUM 40 MG PO TBEC
40.0000 mg | DELAYED_RELEASE_TABLET | Freq: Every day | ORAL | Status: DC
  Administered 2021-05-23 – 2021-06-05 (×14): 40 mg via ORAL
  Filled 2021-05-22 (×14): qty 1

## 2021-05-22 MED ORDER — VITAMIN D3 25 MCG (1000 UNIT) PO TABS
5000.0000 [IU] | ORAL_TABLET | Freq: Every day | ORAL | Status: DC
  Administered 2021-05-23 – 2021-06-05 (×14): 5000 [IU] via ORAL
  Filled 2021-05-22 (×15): qty 5

## 2021-05-22 MED ORDER — FUROSEMIDE 10 MG/ML IJ SOLN
40.0000 mg | Freq: Once | INTRAMUSCULAR | Status: DC
Start: 2021-05-22 — End: 2021-05-22
  Filled 2021-05-22: qty 4

## 2021-05-22 MED ORDER — IPRATROPIUM-ALBUTEROL 0.5-2.5 (3) MG/3ML IN SOLN: 3.0000 mL | Freq: Once | RESPIRATORY_TRACT | Status: DC

## 2021-05-22 MED ORDER — ONDANSETRON HCL 4 MG/2ML IJ SOLN
4.0000 mg | Freq: Four times a day (QID) | INTRAMUSCULAR | Status: DC | PRN
  Administered 2021-05-23 (×2): 4 mg via INTRAVENOUS
  Filled 2021-05-22 (×2): qty 2

## 2021-05-22 MED ORDER — LISINOPRIL 5 MG PO TABS
2.5000 mg | ORAL_TABLET | Freq: Every day | ORAL | Status: DC
  Administered 2021-05-22 – 2021-06-05 (×15): 2.5 mg via ORAL
  Filled 2021-05-22 (×16): qty 1

## 2021-05-22 MED ORDER — DIVALPROEX SODIUM 125 MG PO CSDR
500.0000 mg | DELAYED_RELEASE_CAPSULE | Freq: Two times a day (BID) | ORAL | Status: DC
  Administered 2021-05-22 – 2021-06-03 (×23): 500 mg via ORAL
  Filled 2021-05-22 (×26): qty 4

## 2021-05-22 MED ORDER — POLYETHYLENE GLYCOL 3350 17 G PO PACK
17.0000 g | PACK | Freq: Two times a day (BID) | ORAL | Status: DC
  Administered 2021-05-22 – 2021-06-04 (×21): 17 g via ORAL
  Filled 2021-05-22 (×23): qty 1

## 2021-05-22 MED ORDER — ACETAMINOPHEN 650 MG RE SUPP: 650.0000 mg | Freq: Four times a day (QID) | RECTAL | Status: DC | PRN

## 2021-05-22 MED ORDER — RISPERIDONE 1 MG PO TABS
2.0000 mg | ORAL_TABLET | Freq: Every day | ORAL | Status: DC
  Administered 2021-05-22 – 2021-06-20 (×22): 2 mg via ORAL
  Filled 2021-05-22 (×24): qty 2

## 2021-05-22 MED ORDER — KETOCONAZOLE 2 % EX SHAM
1.0000 "application " | MEDICATED_SHAMPOO | CUTANEOUS | Status: DC
  Administered 2021-05-24 – 2021-06-21 (×5): 1 via TOPICAL
  Filled 2021-05-22 (×2): qty 120

## 2021-05-22 MED ORDER — TRETINOIN 0.025 % EX GEL: 1.0000 "application " | Freq: Every day | CUTANEOUS | Status: DC

## 2021-05-22 MED ORDER — OXYCODONE HCL 5 MG PO TABS
5.0000 mg | ORAL_TABLET | ORAL | Status: DC | PRN
  Administered 2021-05-22 – 2021-05-25 (×6): 5 mg via ORAL
  Filled 2021-05-22 (×7): qty 1

## 2021-05-22 MED ORDER — ONDANSETRON HCL 4 MG PO TABS: 4.0000 mg | ORAL_TABLET | Freq: Four times a day (QID) | ORAL | Status: DC | PRN

## 2021-05-22 MED ORDER — IOHEXOL 350 MG/ML SOLN
100.0000 mL | Freq: Once | INTRAVENOUS | Status: AC | PRN
  Administered 2021-05-22: 23:00:00 100 mL via INTRAVENOUS

## 2021-05-22 MED ORDER — LACTATED RINGERS IV SOLN: INTRAVENOUS | Status: DC

## 2021-05-22 MED ORDER — COLLAGENASE 250 UNIT/GM EX OINT: 1.0000 "application " | TOPICAL_OINTMENT | CUTANEOUS | Status: DC

## 2021-05-22 MED ORDER — SODIUM CHLORIDE 0.9 % IV SOLN
1.0000 g | Freq: Once | INTRAVENOUS | Status: AC
  Administered 2021-05-22: 16:00:00 1 g via INTRAVENOUS
  Filled 2021-05-22: qty 10

## 2021-05-22 MED ORDER — METHYLPREDNISOLONE SODIUM SUCC 40 MG IJ SOLR
40.0000 mg | Freq: Once | INTRAMUSCULAR | Status: AC
  Administered 2021-05-22: 20:00:00 40 mg via INTRAVENOUS
  Filled 2021-05-22: qty 1

## 2021-05-22 MED ORDER — BISACODYL 10 MG RE SUPP
10.0000 mg | RECTAL | Status: DC
  Administered 2021-05-23 – 2021-05-29 (×4): 10 mg via RECTAL
  Filled 2021-05-22 (×6): qty 1

## 2021-05-22 MED ORDER — FLUTICASONE PROPIONATE 50 MCG/ACT NA SUSP
1.0000 | Freq: Every day | NASAL | Status: DC
  Administered 2021-05-23 – 2021-06-05 (×14): 1 via NASAL
  Filled 2021-05-22: qty 16

## 2021-05-22 MED ORDER — SODIUM CHLORIDE 0.9 % IV SOLN
500.0000 mg | Freq: Once | INTRAVENOUS | Status: AC
  Administered 2021-05-22: 17:00:00 500 mg via INTRAVENOUS
  Filled 2021-05-22: qty 5

## 2021-05-22 MED ORDER — ALUM & MAG HYDROXIDE-SIMETH 200-200-20 MG/5ML PO SUSP
15.0000 mL | Freq: Four times a day (QID) | ORAL | Status: DC | PRN
Start: 2021-05-22 — End: 2021-06-22
  Administered 2021-05-24: 04:00:00 30 mL via ORAL
  Filled 2021-05-22: qty 30

## 2021-05-22 MED ORDER — ALBUTEROL SULFATE (2.5 MG/3ML) 0.083% IN NEBU: 2.5000 mg | INHALATION_SOLUTION | RESPIRATORY_TRACT | Status: DC | PRN

## 2021-05-22 MED ORDER — CLOTRIMAZOLE 1 % EX CREA
1.0000 "application " | TOPICAL_CREAM | Freq: Two times a day (BID) | CUTANEOUS | Status: DC
  Administered 2021-05-22 – 2021-06-21 (×55): 1 via TOPICAL
  Filled 2021-05-22: qty 15

## 2021-05-22 MED ORDER — PREVALON HEEL PROTECTOR MISC: 1.0000 | Freq: Every day | Status: DC

## 2021-05-22 MED ORDER — DICLOFENAC SODIUM 1 % EX GEL
4.0000 g | Freq: Four times a day (QID) | CUTANEOUS | Status: DC
  Administered 2021-05-22 – 2021-06-21 (×103): 4 g via TOPICAL
  Filled 2021-05-22 (×2): qty 100

## 2021-05-22 MED ORDER — CALCIUM CARBONATE 1250 (500 CA) MG PO TABS
3.0000 | ORAL_TABLET | Freq: Every day | ORAL | Status: DC
  Administered 2021-05-23 – 2021-06-05 (×14): 1500 mg via ORAL
  Filled 2021-05-22 (×14): qty 2

## 2021-05-22 NOTE — H&P (Signed)
History and Physical    [name] [id number] DOB: 07/11/1960 DOA: 05/22/2021  PCP: Audley Hose, MD   Patient coming from: Group home.  I have personally briefly reviewed patient's old medical records in [place]  Chief Complaint: Shortness of breath and cough.  HPI: [name] is a 60 y.o. male with medical history significant of chronic diastolic CHF, right foot amputee, iron deficiency anemia, anxiety, bipolar disorder, intellectual functional disability, intermittent explosive disorder, obsessive-compulsive disorder, cerebral palsy, chronic bronchitis, constipation, environmental allergies, gastric ulcer with history of UGI bleed necessitating blood transfusion, GERD, hiatal hernia, history of varicella-zoster, hyperlipidemia, osteomyelitis of right foot, history of pneumonia, pressure ulcer of sacral area, history of Pseudomonas infection, recurrent UTIs, seborrheic dermatitis, family diverticulosis, vitamin D deficiency, functional quadriplegia who was brought from his group home due to productive cough for about a week.  He has had so much sputum production though was unable to eat this morning.  He has felt chills and night sweats, but not sure about having a fever.  He has pleuritic chest pain, but no constant precordial CP, palpitations, diaphoresis, lightheadedness, abdominal pain, nausea, emesis, diarrhea, but he gets constipated easily.  He has some trouble sleeping because of the cough.  His appetite is decreased.  His sister stated that he has a history of thromboembolism.  ED Course: Initial vital signs were temperature 99.1 F, pulse 106, respirations 29, BP 131/73 mmHg O2 sat 97% on room air.  However, the patient was placed on nasal cannula oxygen.  He was started on ceftriaxone and azithromycin IVPB.  Furosemide 40 mg IVP ordered but I discontinued it after BNP was normal.  Lab work: His CBC showed a white count of 12.4 with 71% neutrophils, hemoglobin  10.6 g/dL platelets 359.  CMP showed normal electrolytes when calcium was corrected with hypoalbuminemia.  Renal function was normal.  Nonfasting glucose 116 mg/dL.  Total protein is 2.4 g/dL and alkaline phosphatase 132 units/L.  Troponin was normal.  BNP 33.6 pg/mL and lactic acid was 2.8 mmol/L.  Imaging: A portable chest radiograph show cardiomegaly with central pulmonary Betesil prominence suggesting CHF.  There was diffuse haziness being in both lungs, more so on the left side suggesting interval appearance of moderate to large bilateral pleural effusion.  Underlying pneumonia cannot be evaluated due to effusion.  Review of Systems: As per HPI otherwise all other systems reviewed and are negative.  Past Medical History:  Diagnosis Date   Acute on chronic diastolic CHF (congestive heart failure) ([place]) 05/22/2021   Amputee 06/2020   right foot   Anemia    microcytic anemia    Anxiety    takes Ativan daily as needed   Bipolar disorder ([place])    takes Depakote daily   CEREBRAL PALSY 07/24/2006   Chronic bronchitis ([place])    "gets it q yr"   Chronic bronchitis ([place])    Chronic diabetic ulcer of foot determined by examination [facility])    Constipation    takes Miralax daily as needed   Depression    takes Risperdal nightly   Environmental allergies    takes Zyrtec daily   GASTRIC ULCER ACUTE WITH HEMORRHAGE 07/24/2006   Gastric ulcer with hemorrhage    hx of    GERD (gastroesophageal reflux disease)    takes Pravacid daily   History of blood transfusion    no abnormal reaction noted   History of hiatal hernia    History of shingles    History  of stomach ulcers ~ 1996   Hypercholesterolemia    Intellectual functioning disability    Intermittent explosive disorder    Internal hemorrhoids    MRSA infection 09/06/2019   Obsessive compulsive disorder    Osteomyelitis (HCC)    right foot    Pneumonia "quite a few times"   in 2015 and in June 2016   Pressure ulcer of sacral region     hx of    Pseudomonas aeruginosa infection 09/06/2019   Psychotic disorder ([place])    Recurrent UTI (urinary tract infection)    Seborrheic dermatitis    Severe sepsis (HCC)    Sigmoid diverticulosis    Spastic tetraplegia ([place])    URINARY INCONTINENCE 02/09/2010   takes Detrol daily-occasionally incontinence   Vitamin D deficiency    Weakness    numbness in extremities   Past Surgical History:  Procedure Laterality Date   AMPUTATION Right 12/14/2014   Procedure: AMPUTATION DIGIT RIGHT GREAT TOE MTP;  Surgeon: Newt Minion, MD;  Location: [place];  Service: Orthopedics;  Laterality: Right;   AMPUTATION Right 10/08/2019   Procedure: RIGHT FIFTH RAY AMPUTATION, ADJACENT TISSUE TRANSFER;  Surgeon: Evelina Bucy, DPM;  Location: [facility];  Service: Podiatry;  Laterality: Right;   AMPUTATION Right 07/27/2020   Procedure: AMPUTATION BELOW KNEE;  Surgeon: Wylene Simmer, MD;  Location: [place];  Service: Orthopedics;  Laterality: Right;  52min   BIOPSY  08/12/2018   Procedure: BIOPSY;  Surgeon: Jackquline Denmark, MD;  [facility];  Service: Gastroenterology;;   COLONOSCOPY WITH PROPOFOL N/A 08/14/2018   Procedure: COLONOSCOPY WITH PROPOFOL;  Surgeon: Jackquline Denmark, MD;  [facility];  Service: Gastroenterology;  Laterality: N/A;   ESOPHAGOGASTRODUODENOSCOPY (EGD) WITH PROPOFOL N/A 08/12/2018   Procedure: ESOPHAGOGASTRODUODENOSCOPY (EGD) WITH PROPOFOL;  Surgeon: Jackquline Denmark, MD;  [facility];  Service: Gastroenterology;  Laterality: N/A;   FLEXIBLE SIGMOIDOSCOPY N/A 11/09/2020   Procedure: FLEXIBLE SIGMOIDOSCOPY;  Surgeon: Thornton Park, MD;  [facility];  Service: Gastroenterology;  Laterality: N/A;   GRAFT APPLICATION Right [id number]   Procedure: SKIN GRAFT SUBSTITUTE APPLICATION;  Surgeon: Evelina Bucy, DPM;  [facility];  Service: Podiatry;  Laterality: Right;   HERNIA REPAIR     HIATAL HERNIA REPAIR  1982   INCISION AND DRAINAGE ABSCESS N/A  12/08/2020   Procedure: INCISION AND DRAINAGE SACRAL DECUBITUS;  Surgeon: Kieth Brightly Arta Bruce, MD;  Location: [facility];  Service: General;  Laterality: N/A;  66 MIN   LEG SURGERY Bilateral ~ 1967   "legs were scissored; stretched them out"   WOUND DEBRIDEMENT Right 11/12/2019   Procedure: DEBRIDEMENT WOUND;  Surgeon: Evelina Bucy, DPM;  Location[facility];  Service: Podiatry;  Laterality: Right;   Social History  reports that he has never smoked. He has never used smokeless tobacco. He reports that he does not drink alcohol and does not use drugs.  Allergies  Allergen Reactions   Adhesive [Tape] Rash   Family History  Adopted: Yes  Problem Relation Age of Onset   Colon cancer Neg Hx    Prior to Admission medications   Medication Sig Start Date End Date Taking? Authorizing Provider  acetaminophen (TYLENOL) 500 MG tablet Take 500 mg by mouth every 6 (six) hours as needed (for aches, pains, and/or fevers).    [provider]  albuterol (VENTOLIN HFA) 108 (90 Base) MCG/ACT inhaler Inhale 2 puffs into the lungs every 4 (four) hours as needed for shortness of breath or wheezing. 01/11/20  [provider]  alum & mag hydroxide-simeth (MAALOX/MYLANTA) 200-200-20 MG/5ML suspension Take 15-30 mLs by mouth every 6 (six) hours as needed for indigestion or heartburn (or nausea).    [provider]  baclofen (LIORESAL) 20 MG tablet Take 1 tablet (20 mg total) by mouth 3 (three) times daily. 07/18/16   Kirsteins, Victorino Sparrow, MD  bisacodyl (DULCOLAX) 5 MG EC tablet Take 1 tablet (5 mg total) by mouth daily as needed for moderate constipation. 12/15/20   Hollice Espy, MD  cetirizine (ZYRTEC) 10 MG tablet Take 1 tablet (10 mg total) by mouth daily. 11/01/14   Street, Stephanie Coup, MD  diclofenac Sodium (VOLTAREN) 1 % GEL Apply 4 g topically See admin instructions. Apply 4 grams to painful areas four times a day 12/15/20   Hollice Espy, MD  divalproex (DEPAKOTE SPRINKLE) 125  MG capsule Take 125 mg by mouth 2 (two) times daily. (0700 & 2000)    [provider]  Ensure (ENSURE) Take 237 mLs by mouth 2 (two) times daily between meals.    [provider]  FERREX 150 150 MG capsule Take 150 mg by mouth daily at 12 noon.    [provider]  fluticasone (FLONASE) 50 MCG/ACT nasal spray Place 1 spray into both nostrils daily. (0700) 12/21/19   [provider]  Foot Care Products (PREVALON HEEL PROTECTOR) MISC 1 Device by Does not apply route daily. 06/20/15   Marquette Saa, MD  guaiFENesin-dextromethorphan [facility] DM) 100-10 MG/5ML syrup Take 10 mLs by mouth every 4 (four) hours as needed for cough.     [provider]  HYDROcodone-acetaminophen (NORCO/VICODIN) 5-325 MG tablet Take 1 tablet by mouth every 4 (four) hours as needed for severe pain. 12/15/20   Hollice Espy, MD  lactulose (CHRONULAC) 10 GM/15ML solution Take 15 mLs (10 g total) by mouth daily. 12/16/20   Hollice Espy, MD  liver oil-zinc oxide (DESITIN) 40 % ointment Apply 1 application topically 2 (two) times daily as needed (unspecified). 12/15/20   Hollice Espy, MD  ondansetron (ZOFRAN) 4 MG tablet Take 1 tablet (4 mg total) by mouth every 6 (six) hours as needed for nausea. 08/03/20   Marguerita Merles Latif, DO  polyethylene glycol powder (GLYCOLAX/MIRALAX) 17 GM/SCOOP powder Take 17 g by mouth See admin instructions. Mix 17 grams as directed every morning and once daily as needed for constipation    [provider]  SANTYL ointment Apply 1 application topically See admin instructions. Apply to wound in the sacral area once a day    [provider]  tretinoin (RETIN-A) 0.025 % gel Apply 1 application topically See admin instructions. Apply to the affected areas of the face at bedtime    [provider]  omeprazole (PRILOSEC) 40 MG capsule Take 1 capsule (40 mg total) by mouth 2 (two) times daily. Patient not taking:  Reported on 12/06/2020 08/15/18 12/15/20  Rodolph Bong, MD  Ethelda Chick (OYSTER CALCIUM) 500 MG TABS tablet Take 1,500 mg of elemental calcium by mouth daily. Patient not taking: Reported on 12/06/2020  12/15/20  [provider]    Physical Exam: Vitals:   05/22/21 1255 05/22/21 1400 05/22/21 1500  BP: 131/73 125/79 127/83  Pulse: (!) 106 (!) 103 100  Resp: (!) 29 (!) 28 (!) 26  Temp: 99.1 F (37.3 C)    TempSrc: Rectal    SpO2: 97% 93% 93%   Constitutional: Chronically ill-appearing.  NAD, calm, comfortable Eyes: PERRL, lids  and conjunctivae normal.  Mild periorbital ecchymosis. ENMT: Mucous membranes are moist.  Lips are dry.. Posterior pharynx clear of any exudate or lesions. Neck: normal, supple, no masses, no thyromegaly Respiratory: Mildly tachypneic with decreased breath sounds bilaterally in bases and bilateral rhonchi. No accessory muscle use.  Cardiovascular: Regular rate and rhythm, no murmurs / rubs / gallops. No extremity edema. 2+ pedal pulses. No carotid bruits.  Abdomen: No distention.  Bowel sounds positive. Soft, no tenderness, no masses palpated. No hepatosplenomegaly.  Musculoskeletal: no clubbing / cyanosis.  Left foot amputation.  Spastic muscle tone. Skin: Stage IV 5 x 5 cm sacral ulcer.  Seborrheic dermatitis of right eyebrow. Neurologic: Spastic quadriplegia. Psychiatric: Normal judgment and insight. Alert and oriented x 3. Normal mood.   Labs on Admission: I have personally reviewed following labs and imaging studies  CBC: Recent Labs  Lab 05/22/21 1348  WBC 12.4*  NEUTROABS 8.8*  HGB 10.6*  HCT 34.1*  MCV 75.1*  PLT [id number]    Basic Metabolic Panel: Recent Labs  Lab 05/22/21 1348  NA 136  K 4.0  CL 105  CO2 22  GLUCOSE 116*  BUN 18  CREATININE 0.41*  CALCIUM 8.6*    GFR: CrCl cannot be calculated (Unknown ideal weight.).  Liver Function Tests: Recent Labs  Lab 05/22/21 1348  AST 29  ALT 23  ALKPHOS 132*  BILITOT 0.5   PROT 6.9  ALBUMIN 2.4*   Radiological Exams on Admission: DG Chest Port 1 View  Result Date: 05/22/2021 CLINICAL DATA:  Cough, shortness of breath EXAM: PORTABLE CHEST 1 VIEW COMPARISON:  02/20/2021 FINDINGS: Transverse diameter of heart is increased. There is interval appearance of diffuse haziness in both lungs, more so on the left side. Both diaphragms are obscured. Costophrenic angles are indistinct. There is no pneumothorax. IMPRESSION: Cardiomegaly. Central pulmonary vessels are prominent suggesting CHF. Diffuse haziness is seen in the both lungs, more so on the left side suggesting interval appearance of moderate to large bilateral pleural effusions, more so on the left side. Possibility of underlying pneumonia could not be evaluated due to pleural effusions. Electronically Signed   By: Elmer Picker M.D.   On: 05/22/2021 13:52    02/21/2021 echocardiogram IMPRESSIONS  \  1. Left ventricular ejection fraction, by estimation, is 55%. The left  ventricle has normal function. Left ventricular endocardial border not  optimally defined to evaluate regional wall motion. Left ventricular  diastolic parameters are consistent with  Grade I diastolic dysfunction (impaired relaxation).   2. Right ventricular systolic function is normal. The right ventricular  size is not well visualized.   3. The mitral valve is grossly normal. No evidence of mitral valve  regurgitation.   4. The aortic valve was not well visualized. Aortic valve regurgitation  is not visualized.   EKG: Independently reviewed.  Vent. rate 104 BPM PR interval 118 ms QRS duration 84 ms QT/QTcB 336/442 ms P-R-T axes 53 99 -39 Sinus tachycardia Right axis deviation Low voltage, precordial leads Borderline T abnormalities, diffuse leads  Assessment/Plan Principal Problem:   Acute dyspnea Suspect aspiration pneumonia. Admit to PCU/observation. Continue supplemental oxygen. Consult RT for periodic  suctioning. Bronchodilators as needed. Solu-Medrol 40 mg IVPB x1. Continue azithromycin 500 mg IVPB daily.   Continue ceftriaxone 1000 mg IVPB daily. Check CTA to rule out PE and further characterize.  Active Problems:   Lactic acidosis Continue time-limited IV fluids for Recheck lactic acid.    Chronic diastolic CHF (congestive heart failure) (HCC) No peripheral  edema. BNP was normal. Symptoms likely due to aspiration.    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    Infantile cerebral palsy ([place])   Functional quadriplegia ([place]) Supportive care. Analgesics as needed. Muscle relaxants as needed.    Microcytic/iron deficiency anemia Monitor hematocrit and hemoglobin.    Protein-calorie malnutrition, severe Protein supplementation. Consider nutritional services evaluation.     DVT prophylaxis: Lovenox SQ. Code Status:   Full code. Family Communication:  His Sister [name] [facility]) was seen in the ED room. Disposition Plan:   Patient is from:  Group home.  Anticipated DC to:  Group home.  Anticipated DC date:  05/24/2021.  Anticipated DC barriers: Clinical status.  Consults called:   Admission status:  Observation/telemetry.   Severity of Illness: High severity in the setting of cough with acute dyspnea due to bilateral pleural effusion of unknown etiology.  [name] Milan MD [facility]  How to contact the [facility] Attending or Consulting provider [place] or covering provider during after hours [place], for this patient?   Check the care team in [facility] and look for a) attending/consulting [facility] provider listed and b) the [facility] team listed Log into [email or web address] and use [place]'s universal password to access. If you do not have the password, please contact the hospital operator. Locate the [facility] provider you are looking for under [facility] and page to a number that you can be directly reached. If you still  have difficulty reaching the provider, please page the [facility] (Director on Call) for the Hospitalists listed on amion for assistance.  05/22/2021, 4:20 PM   This document was created using Par[employer] and may contain some unintended transcription errors.

## 2021-05-22 NOTE — Progress Notes (Signed)
The patient arrived to the unit. Pt is alert, oriented x4. Contracted RUE /LUE. 92% ra. Bilateral ears red pressure stageI. Bil elbows red, intact, foams applied. Left inner knee 0.5x 0.5cm stage II. RT BKA, stump intact. Left heel, intact dry. Small scabbing on toes. Sacrum stage III, Wet to dry and foam applied.

## 2021-05-22 NOTE — Plan of Care (Signed)
  Problem: Education: Goal: Knowledge of General Education information will improve Description Including pain rating scale, medication(s)/side effects and non-pharmacologic comfort measures Outcome: Progressing   

## 2021-05-22 NOTE — ED Notes (Signed)
ED TO INPATIENT HANDOFF REPORT  ED Nurse Name and Phone #: Romelle Starcher, RN  S Name/Age/Gender [name] 60 y.o. male Room/Bed: WA22/WA22  Code Status   Code Status: Full Code  Home/SNF/Other Group Home ([place]) Patient oriented to: self, place, time, and situation Is this baseline? Yes   Triage Complete: Triage complete  Chief Complaint Acute dyspnea [R06.00]  Triage Note Patient here from group home reporting cough x1 week.    Allergies Allergies  Allergen Reactions   Adhesive [Tape] Rash    Level of Care/Admitting Diagnosis ED Disposition     ED Disposition  Admit   Condition  --   Comment  Hospital Area: [place] [[id number]]  Level of Care: Progressive [102]  Admit to Progressive based on following criteria: RESPIRATORY PROBLEMS hypoxemic/hypercapnic respiratory failure that is responsive to NIPPV (BiPAP) or High Flow Nasal Cannula (6-80 lpm). Frequent assessment/intervention, no > Q2 hrs < Q4 hrs, to maintain oxygenation and pulmonary hygiene.  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   Anemia    microcytic anemia    Anxiety    takes Ativan daily as needed   Bipolar disorder ([place])    takes Depakote daily   CEREBRAL PALSY 07/24/2006   Chronic bronchitis ([place])    "gets it q yr"   Chronic bronchitis ([place])    Chronic diabetic ulcer of foot determined by examination ([place])    Constipation    takes Miralax daily as needed   Depression    takes Risperdal nightly   Environmental  allergies    takes Zyrtec daily   GASTRIC ULCER ACUTE WITH HEMORRHAGE 07/24/2006   Gastric ulcer with hemorrhage    hx of    GERD (gastroesophageal reflux disease)    takes Pravacid daily   History of blood transfusion    no abnormal reaction noted   History of hiatal hernia    History of shingles    History of stomach ulcers ~ 1996   Hypercholesterolemia    Intellectual functioning disability    Intermittent explosive disorder    Internal hemorrhoids    MRSA infection 09/06/2019   Obsessive compulsive disorder    Osteomyelitis ([place])    right foot    Pneumonia "quite a few times"   in 2015 and in June 2016   Pressure ulcer of sacral region    hx of    Pseudomonas aeruginosa infection 09/06/2019   Psychotic disorder ([place])    Recurrent UTI (urinary tract infection)    Seborrheic dermatitis    Severe sepsis ([place])    Sigmoid diverticulosis    Spastic tetraplegia ([place])    URINARY INCONTINENCE 02/09/2010   takes [place] daily-occasionally incontinence   Vitamin D deficiency    Weakness    numbness in extremities   Past Surgical History:  Procedure Laterality Date   AMPUTATION Right 12/14/2014   Procedure: AMPUTATION DIGIT RIGHT GREAT TOE MTP;  Surgeon: Newt Minion, MD;  Location: [place];  Service: Orthopedics;  Laterality: Right;   AMPUTATION Right 10/08/2019  Procedure: RIGHT FIFTH RAY AMPUTATION, ADJACENT TISSUE TRANSFER;  Surgeon: Evelina Bucy, DPM;  Location: [facility];  Service: Podiatry;  Laterality: Right;   AMPUTATION Right 07/27/2020   Procedure: AMPUTATION BELOW KNEE;  Surgeon: Wylene Simmer, MD;  Location: [place] [facility];  Service: Orthopedics;  Laterality: Right;  14min   BIOPSY  08/12/2018   Procedure: BIOPSY;  Surgeon: Jackquline Denmark, MD;  [facility];  Service: Gastroenterology;;   COLONOSCOPY WITH PROPOFOL N/A 08/14/2018   Procedure: COLONOSCOPY WITH PROPOFOL;  Surgeon: Jackquline Denmark, MD;  [facility];  Service: Gastroenterology;  Laterality: N/A;    ESOPHAGOGASTRODUODENOSCOPY (EGD) WITH PROPOFOL N/A 08/12/2018   Procedure: ESOPHAGOGASTRODUODENOSCOPY (EGD) WITH PROPOFOL;  Surgeon: Jackquline Denmark, MD;  [facility];  Service: Gastroenterology;  Laterality: N/A;   FLEXIBLE SIGMOIDOSCOPY N/A 11/09/2020   Procedure: FLEXIBLE SIGMOIDOSCOPY;  Surgeon: Thornton Park, MD;  [facility];  Service: Gastroenterology;  Laterality: N/A;   GRAFT APPLICATION Right [id number]   Procedure: SKIN GRAFT SUBSTITUTE APPLICATION;  Surgeon: Evelina Bucy, DPM;  Location[facility];  Service: Podiatry;  Laterality: Right;   HERNIA REPAIR     HIATAL HERNIA REPAIR  1982   INCISION AND DRAINAGE ABSCESS N/A 12/08/2020   Procedure: INCISION AND DRAINAGE SACRAL DECUBITUS;  Surgeon: Kieth Brightly Arta Bruce, MD;  Location[facility];  Service: General;  Laterality: N/A;  16 MIN   LEG SURGERY Bilateral ~ 1967   "legs were scissored; stretched them out"   WOUND DEBRIDEMENT Right 11/12/2019   Procedure: DEBRIDEMENT WOUND;  Surgeon: Evelina Bucy, DPM;  [facility];  Service: Podiatry;  Laterality: Right;     A IV Location/Drains/Wounds Patient Lines/Drains/Airways Status     Active Line/Drains/Airways     Name Placement date Placement time Site Days   Peripheral IV 05/22/21 20 G Anterior;Left Hand 05/22/21  1415  Hand  less than 1   External Urinary Catheter 02/25/21  1245  --  86   Incision (Closed) 07/27/20 Leg Right 07/27/20  1151  -- 299   Incision (Closed) 09/05/20 Pretibial Right;Proximal 09/05/20  0439  -- 259   Incision (Closed) 12/08/20 Sacrum Left 12/08/20  1422  -- 165   Pressure Injury Arm Left;Posterior;Proximal;Upper;Other (Comment) Stage II -  Partial thickness loss of dermis presenting as a shallow open ulcer with a red, pink wound bed without slough. pink open wound on armpit --  --  -- --   Pressure Injury 07/31/20 Foot Left;Medial Stage 3 -  Full thickness tissue loss. Subcutaneous fat may be visible but bone, tendon or muscle  are NOT exposed. 07/31/20  2152  -- 295   Pressure Injury 08/01/20 Knee Left;Medial Stage 1 -  Intact skin with non-blanchable redness of a localized area usually over a bony prominence. 08/01/20  --  -- 294   Pressure Injury 08/01/20 Sacrum Right Unstageable - Full thickness tissue loss in which the base of the injury is covered by slough (yellow, tan, gray, green or brown) and/or eschar (tan, brown or black) in the wound bed. 08/01/20  --  -- 294   Pressure Injury 10/06/20 Thigh Left;Lateral Stage 1 -  Intact skin with non-blanchable redness of a localized area usually over a bony prominence. Yellow wound bed 10/06/20  0800  -- 228   Pressure Injury 10/06/20 Thigh Right;Lateral Stage 1 -  Intact skin with non-blanchable redness of a localized area usually over a bony prominence. red nonblanchable 10/06/20  0800  -- 228   Pressure Injury 12/12/20 Sacrum Right  Unstageable - Full thickness tissue loss in which the base of the injury is covered by slough (yellow, tan, gray, green or brown) and/or eschar (tan, brown or black) in the wound bed. PT only, wound on right sacral a 12/12/20  --  -- 161   Pressure Injury 02/21/21 Sacrum Lower Stage 3 -  Full thickness tissue loss. Subcutaneous fat may be visible but bone, tendon or muscle are NOT exposed. 02/21/21  0850  -- 90   Pressure Injury 02/21/21 Knee Left;Medial Stage 2 -  Partial thickness loss of dermis presenting as a shallow open injury with a red, pink wound bed without slough. 02/21/21  2100  -- 90            Intake/Output Last 24 hours No intake or output data in the 24 hours ending 05/22/21 1638  Labs/Imaging Results for orders placed or performed during the hospital encounter of 05/22/21 (from the past 48 hour(s))  CBC with Differential/Platelet     Status: Abnormal   Collection Time: 05/22/21  1:48 PM  Result Value Ref Range   WBC 12.4 (H) 4.0 - 10.5 K/uL   RBC 4.54 4.22 - 5.81 MIL/uL   Hemoglobin 10.6 (L) 13.0 - 17.0 g/dL   HCT  34.1 (L) 39.0 - 52.0 %   MCV 75.1 (L) 80.0 - 100.0 fL   MCH 23.3 (L) 26.0 - 34.0 pg   MCHC 31.1 30.0 - 36.0 g/dL   RDW 19.1 (H) 11.5 - 15.5 %   Platelets 359 150 - 400 K/uL   nRBC 0.0 0.0 - 0.2 %   Neutrophils Relative % 71 %   Neutro Abs 8.8 (H) 1.7 - 7.7 K/uL   Lymphocytes Relative 14 %   Lymphs Abs 1.7 0.7 - 4.0 K/uL   Monocytes Relative 14 %   Monocytes Absolute 1.7 (H) 0.1 - 1.0 K/uL   Eosinophils Relative 0 %   Eosinophils Absolute 0.0 0.0 - 0.5 K/uL   Basophils Relative 0 %   Basophils Absolute 0.1 0.0 - 0.1 K/uL   Immature Granulocytes 1 %   Abs Immature Granulocytes 0.13 (H) 0.00 - 0.07 K/uL    Comment: Performed at [facility], [place] [address], [place], [place] [address]  Comprehensive metabolic panel     Status: Abnormal   Collection Time: 05/22/21  1:48 PM  Result Value Ref Range   Sodium 136 135 - 145 mmol/L   Potassium 4.0 3.5 - 5.1 mmol/L   Chloride 105 98 - 111 mmol/L   CO2 22 22 - 32 mmol/L   Glucose, Bld 116 (H) 70 - 99 mg/dL    Comment: Glucose reference range applies only to samples taken after fasting for at least 8 hours.   BUN 18 8 - 23 mg/dL   Creatinine, Ser 0.41 (L) 0.61 - 1.24 mg/dL   Calcium 8.6 (L) 8.9 - 10.3 mg/dL   Total Protein 6.9 6.5 - 8.1 g/dL   Albumin 2.4 (L) 3.5 - 5.0 g/dL   AST 29 15 - 41 U/L   ALT 23 0 - 44 U/L   Alkaline Phosphatase 132 (H) 38 - 126 U/L   Total Bilirubin 0.5 0.3 - 1.2 mg/dL   GFR, Estimated >60 >60 mL/min    Comment: (NOTE) Calculated using the CKD-EPI Creatinine Equation (2021)    Anion gap 9 5 - 15    Comment: Performed at [facility], [place] [name], [place], [place] [address]  Lactic acid, plasma  Status: Abnormal   Collection Time: 05/22/21  1:48 PM  Result Value Ref Range   Lactic Acid, Venous 2.8 (HH) 0.5 - 1.9 mmol/L    Comment: CRITICAL RESULT CALLED TO, READ BACK BY AND VERIFIED WITHDesiree Lucy RN AT 1415 05/22/21 MULLINS,T Performed at [facility], [place] [address], [place], [place] [address]   Resp Panel by RT-PCR (Flu A&B, Covid) Nasopharyngeal Swab     Status: None   Collection Time: 05/22/21  1:48 PM   Specimen: Nasopharyngeal Swab; Nasopharyngeal(NP) swabs in vial transport medium  Result Value Ref Range   SARS Coronavirus 2 by RT PCR NEGATIVE NEGATIVE    Comment: (NOTE) SARS-CoV-2 target nucleic acids are NOT DETECTED.  The SARS-CoV-2 RNA is generally detectable in upper respiratory specimens during the acute phase of infection. The lowest concentration of SARS-CoV-2 viral copies this assay can detect is 138 copies/mL. A negative result does not preclude SARS-Cov-2 infection and should not be used as the sole basis for treatment or other patient management decisions. A negative result may occur with  improper specimen collection/handling, submission of specimen other than nasopharyngeal swab, presence of viral mutation(s) within the areas targeted by this assay, and inadequate number of viral copies(<138 copies/mL). A negative result must be combined with clinical observations, patient history, and epidemiological information. The expected result is Negative.  Fact Sheet for Patients:  EntrepreneurPulse.com.au  Fact Sheet for Healthcare Providers:  IncredibleEmployment.be  This test is no t yet approved or cleared by the [place] FDA and  has been authorized for detection and/or diagnosis of SARS-CoV-2 by FDA under an Emergency Use Authorization (EUA). This EUA will remain  in effect (meaning this test can be used) for the duration of the COVID-19 declaration under Section 564(b)(1) of the Act, 21 U.S.C.section 360bbb-3(b)(1), unless the authorization is terminated  or revoked sooner.       Influenza A by PCR NEGATIVE NEGATIVE   Influenza B by PCR NEGATIVE NEGATIVE    Comment: (NOTE) The Xpert Xpress SARS-CoV-2/FLU/RSV plus assay is intended as an aid in the  diagnosis of influenza from Nasopharyngeal swab specimens and should not be used as a sole basis for treatment. Nasal washings and aspirates are unacceptable for Xpert Xpress SARS-CoV-2/FLU/RSV testing.  Fact Sheet for Patients: EntrepreneurPulse.com.au  Fact Sheet for Healthcare Providers: IncredibleEmployment.be  This test is not yet approved or cleared by the [place] FDA and has been authorized for detection and/or diagnosis of SARS-CoV-2 by FDA under an Emergency Use Authorization (EUA). This EUA will remain in effect (meaning this test can be used) for the duration of the COVID-19 declaration under Section 564(b)(1) of the Act, 21 U.S.C. section 360bbb-3(b)(1), unless the authorization is terminated or revoked.  Performed at [facility], [place] [address] [place], [place] [address]   Brain natriuretic peptide     Status: None   Collection Time: 05/22/21  1:48 PM  Result Value Ref Range   B Natriuretic Peptide 33.6 0.0 - 100.0 pg/mL    Comment: Performed at [facility], [place] [address] [place], [place] [address]  Troponin I (High Sensitivity)     Status: None   Collection Time: 05/22/21  1:48 PM  Result Value Ref Range   Troponin I (High Sensitivity) 5 <18 ng/L    Comment: (NOTE) Elevated high sensitivity troponin I (hsTnI) values and significant  changes across serial measurements may suggest ACS but many other  chronic and acute conditions are known to elevate  hsTnI results.  Refer to the "Links" section for chest pain algorithms and additional  guidance. Performed at [facility], [place] [address], [place], [place] [address]    DG Chest Port 1 View  Result Date: 05/22/2021 CLINICAL DATA:  Cough, shortness of breath EXAM: PORTABLE CHEST 1 VIEW COMPARISON:  02/20/2021 FINDINGS: Transverse diameter of heart is increased. There is interval appearance of diffuse haziness in  both lungs, more so on the left side. Both diaphragms are obscured. Costophrenic angles are indistinct. There is no pneumothorax. IMPRESSION: Cardiomegaly. Central pulmonary vessels are prominent suggesting CHF. Diffuse haziness is seen in the both lungs, more so on the left side suggesting interval appearance of moderate to large bilateral pleural effusions, more so on the left side. Possibility of underlying pneumonia could not be evaluated due to pleural effusions. Electronically Signed   By: Elmer Picker M.D.   On: 05/22/2021 13:52    Pending Labs Unresulted Labs (From admission, onward)     Start     Ordered   05/29/21 0500  Creatinine, serum  (enoxaparin (LOVENOX)    CrCl >/= 30 ml/min)  Weekly,   R     Comments: while on enoxaparin therapy    05/22/21 1603   05/23/21 0500  Procalcitonin  Tomorrow morning,   R        05/22/21 1556   05/23/21 [id number]  Basic metabolic panel  Daily,   R      05/22/21 1605   05/22/21 1600  Strep pneumoniae urinary antigen  (COPD / Pneumonia / Cellulitis / Lower Extremity Wound)  Once,   R        05/22/21 1603   05/22/21 1557  Procalcitonin - Baseline  Add-on,   AD        05/22/21 1556            Vitals/Pain Today's Vitals   05/22/21 1255 05/22/21 1258 05/22/21 1400 05/22/21 1500  BP: 131/73  125/79 127/83  Pulse: (!) 106  (!) 103 100  Resp: (!) 29  (!) 28 (!) 26  Temp: 99.1 F (37.3 C)     TempSrc: Rectal     SpO2: 97%  93% 93%  PainSc:  10-Worst pain ever      Isolation Precautions No active isolations  Medications Medications  lactated ringers infusion ( Intravenous New Bag/Given 05/22/21 1415)  azithromycin (ZITHROMAX) 500 mg in sodium chloride 0.9 % 250 mL IVPB (has no administration in time range)  enoxaparin (LOVENOX) injection 40 mg (has no administration in time range)  acetaminophen (TYLENOL) tablet 650 mg (has no administration in time range)    Or  acetaminophen (TYLENOL) suppository 650 mg (has no administration in  time range)  ondansetron (ZOFRAN) tablet 4 mg (has no administration in time range)    Or  ondansetron (ZOFRAN) injection 4 mg (has no administration in time range)  cefTRIAXone (ROCEPHIN) 1 g in sodium chloride 0.9 % 100 mL IVPB (has no administration in time range)  azithromycin (ZITHROMAX) 500 mg in sodium chloride 0.9 % 250 mL IVPB (has no administration in time range)  albuterol (PROVENTIL) (2.5 MG/3ML) 0.083% nebulizer solution 2.5 mg (has no administration in time range)  lisinopril (ZESTRIL) tablet 2.5 mg (has no administration in time range)  cefTRIAXone (ROCEPHIN) 1 g in sodium chloride 0.9 % 100 mL IVPB (1 g Intravenous New Bag/Given 05/22/21 1530)    Mobility  Low fall risk   Focused Assessments    R Recommendations: See Admitting Provider  Note  Report given to:   Additional Notes:

## 2021-05-22 NOTE — ED Triage Notes (Signed)
Patient here from group home reporting cough x1 week.

## 2021-05-22 NOTE — ED Provider Notes (Signed)
[facility]-EMERGENCY DEPT Provider Note   CSN: [id number] Arrival date & time: 05/22/21  1235     History Chief Complaint  Patient presents with   Cough    [name] is a 60 y.o. male.  Patient is a 60 year old male with multiple medical problems including cerebral palsy, being wheelchair-bound, diabetes, prior gastric ulcer with hemorrhage, recurrent UTIs who is presenting today from his group home due to persistent coughing.  Per their report he has been coughing for the last week but the patient reports that since yesterday the cough has been constant and he has been feeling short of breath.  He reports trying to eat today but he was coughing so much he was not able to eat a whole lot.  He denies any choking on food.  He does not know if he has been running a fever.  Unknown if there are other sick contacts where he lives.  The history is provided by the patient and medical records.  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   Anemia    microcytic anemia    Anxiety    takes Ativan daily as needed   Bipolar disorder (HCC)    takes Depakote daily   CEREBRAL PALSY 07/24/2006   Chronic bronchitis (HCC)    "gets it q yr"   Chronic bronchitis (HCC)    Chronic diabetic ulcer of foot determined by examination [facility])    Constipation    takes Miralax daily as needed   Depression    takes Risperdal nightly   Environmental allergies    takes Zyrtec daily   GASTRIC ULCER ACUTE WITH HEMORRHAGE 07/24/2006   Gastric ulcer with hemorrhage    hx of    GERD (gastroesophageal reflux disease)    takes Pravacid daily   History of blood transfusion    no abnormal reaction noted   History of hiatal hernia    History of shingles    History of stomach ulcers ~ 1996   Hypercholesterolemia    Intellectual functioning disability    Intermittent explosive disorder    Internal hemorrhoids    MRSA infection 09/06/2019    Obsessive compulsive disorder    Osteomyelitis (HCC)    right foot    Pneumonia "quite a few times"   in 2015 and in June 2016   Pressure ulcer of sacral region    hx of    Pseudomonas aeruginosa infection 09/06/2019   Psychotic disorder (HCC)    Recurrent UTI (urinary tract infection)    Seborrheic dermatitis    Severe sepsis (HCC)    Sigmoid diverticulosis    Spastic tetraplegia (HCC)    URINARY INCONTINENCE 02/09/2010   takes Detrol daily-occasionally incontinence   Vitamin D deficiency    Weakness    numbness in extremities    Patient Active Problem List   Diagnosis Date Noted   Protein-calorie malnutrition, severe 02/28/2021   Iron deficiency anemia 02/20/2021   Obsessive compulsive disorder    Cellulitis of sacral region 12/06/2020   Right pulmonary infiltrate on CXR 10/03/2020   Dysphagia 10/03/2020   GERD without esophagitis 10/03/2020   Aspiration pneumonia (HCC) 10/03/2020   Recurrent right pleural effusion 10/02/2020   Abnormal CT scan, colon    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   Nausea and vomiting 07/31/2020   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) 10/08/2019   Osteomyelitis (HCC) 10/08/2019  Acute osteomyelitis of right ankle or foot (HCC)    Ulcerated, foot, right, with necrosis of bone (HCC)    Pseudomonas aeruginosa infection 09/06/2019   MRSA infection 09/06/2019   Lactic acidosis    Stage 3 skin ulcer of sacral region ([place]) 08/10/2018   Gastric wall thickening 08/10/2018   Alkaline phosphatase elevation 08/10/2018   Cholelithiasis 08/10/2018   Microcytic anemia    Hiatal hernia 04/07/2018   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 ([place]) 01/20/2016   Functional quadriplegia ([place]) 12/15/2015   Cerebral palsy, quadriplegic ([place])    Pre-diabetes 08/31/2015   Foot ulcer, limited to breakdown of skin ([place]) 06/08/2015   Seasonal allergies 11/01/2014   Pressure ulcer of foot 03/14/2014   CAP (community acquired pneumonia) 03/13/2014    Neuropathy 01/13/2013   Decubitus ulcer of left hip, stage 3 ([place]) 01/08/2013   URINARY INCONTINENCE 02/09/2010   Intellectual disability 07/24/2006   Infantile cerebral palsy ([place]) 07/24/2006    Past Surgical History:  Procedure Laterality Date   AMPUTATION Right 12/14/2014   Procedure: AMPUTATION DIGIT RIGHT GREAT TOE MTP;  Surgeon: Newt Minion, MD;  Location: [facility];  Service: Orthopedics;  Laterality: Right;   AMPUTATION Right 10/08/2019   Procedure: RIGHT FIFTH RAY AMPUTATION, ADJACENT TISSUE TRANSFER;  Surgeon: Evelina Bucy, DPM;  Location: [facility];  Service: Podiatry;  Laterality: Right;   AMPUTATION Right 07/27/2020   Procedure: AMPUTATION BELOW KNEE;  Surgeon: Wylene Simmer, MD;  Location: [place];  Service: Orthopedics;  Laterality: Right;  66min   BIOPSY  08/12/2018   Procedure: BIOPSY;  Surgeon: Jackquline Denmark, MD;  Location: [facility];  Service: Gastroenterology;;   COLONOSCOPY WITH PROPOFOL N/A 08/14/2018   Procedure: COLONOSCOPY WITH PROPOFOL;  Surgeon: Jackquline Denmark, MD;  [facility];  Service: Gastroenterology;  Laterality: N/A;   ESOPHAGOGASTRODUODENOSCOPY (EGD) WITH PROPOFOL N/A 08/12/2018   Procedure: ESOPHAGOGASTRODUODENOSCOPY (EGD) WITH PROPOFOL;  Surgeon: Jackquline Denmark, MD;  [facility];  Service: Gastroenterology;  Laterality: N/A;   FLEXIBLE SIGMOIDOSCOPY N/A 11/09/2020   Procedure: FLEXIBLE SIGMOIDOSCOPY;  Surgeon: Thornton Park, MD;  [facility];  Service: Gastroenterology;  Laterality: N/A;   GRAFT APPLICATION Right [id number]   Procedure: SKIN GRAFT SUBSTITUTE APPLICATION;  Surgeon: Evelina Bucy, DPM;  Location: [facility];  Service: Podiatry;  Laterality: Right;   HERNIA REPAIR     HIATAL HERNIA REPAIR  1982   INCISION AND DRAINAGE ABSCESS N/A 12/08/2020   Procedure: INCISION AND DRAINAGE SACRAL DECUBITUS;  Surgeon: Kieth Brightly Arta Bruce, MD;  Location: [facility];  Service: General;  Laterality: N/A;  31 MIN   LEG SURGERY Bilateral  ~ 1967   "legs were scissored; stretched them out"   WOUND DEBRIDEMENT Right 11/12/2019   Procedure: DEBRIDEMENT WOUND;  Surgeon: Evelina Bucy, DPM;  Location: [facility];  Service: Podiatry;  Laterality: Right;       Family History  Adopted: Yes  Problem Relation Age of Onset   Colon cancer Neg Hx     Social History   Tobacco Use   Smoking status: Never   Smokeless tobacco: Never  Substance Use Topics   Alcohol use: No   Drug use: No    Home Medications Prior to Admission medications   Medication Sig Start Date End Date Taking? Authorizing Provider  acetaminophen (TYLENOL) 500 MG tablet Take 500 mg by mouth every 6 (six) hours as needed (for aches, pains, and/or fevers).    [provider]  albuterol (VENTOLIN HFA) 108 (90  Base) MCG/ACT inhaler Inhale 2 puffs into the lungs every 4 (four) hours as needed for shortness of breath or wheezing. 01/11/20   [provider]  alum & mag hydroxide-simeth (MAALOX/MYLANTA) 200-200-20 MG/5ML suspension Take 15-30 mLs by mouth every 6 (six) hours as needed for indigestion or heartburn (or nausea).    [provider]  baclofen (LIORESAL) 20 MG tablet Take 1 tablet (20 mg total) by mouth 3 (three) times daily. 07/18/16   Kirsteins, Luanna Salk, MD  bisacodyl (DULCOLAX) 5 MG EC tablet Take 1 tablet (5 mg total) by mouth daily as needed for moderate constipation. 12/15/20   Annita Brod, MD  cetirizine (ZYRTEC) 10 MG tablet Take 1 tablet (10 mg total) by mouth daily. 11/01/14   Street, Sharon Mt, MD  diclofenac Sodium (VOLTAREN) 1 % GEL Apply 4 g topically See admin instructions. Apply 4 grams to painful areas four times a day 12/15/20   Annita Brod, MD  divalproex (DEPAKOTE SPRINKLE) 125 MG capsule Take 125 mg by mouth 2 (two) times daily. (0700 & 2000)    [provider]  Ensure (ENSURE) Take 237 mLs by mouth 2 (two) times daily between meals.    [provider]  FERREX 150 150 MG capsule Take  150 mg by mouth daily at 12 noon.    [provider]  fluticasone (FLONASE) 50 MCG/ACT nasal spray Place 1 spray into both nostrils daily. (0700) 12/21/19   [provider]  Foot Care Products (PREVALON HEEL PROTECTOR) MISC 1 Device by Does not apply route daily. 06/20/15   Verner Mould, MD  guaiFENesin-dextromethorphan [facility] DM) 100-10 MG/5ML syrup Take 10 mLs by mouth every 4 (four) hours as needed for cough.     [provider]  HYDROcodone-acetaminophen (NORCO/VICODIN) 5-325 MG tablet Take 1 tablet by mouth every 4 (four) hours as needed for severe pain. 12/15/20   Annita Brod, MD  lactulose (CHRONULAC) 10 GM/15ML solution Take 15 mLs (10 g total) by mouth daily. 12/16/20   Annita Brod, MD  liver oil-zinc oxide (DESITIN) 40 % ointment Apply 1 application topically 2 (two) times daily as needed (unspecified). 12/15/20   Annita Brod, MD  ondansetron (ZOFRAN) 4 MG tablet Take 1 tablet (4 mg total) by mouth every 6 (six) hours as needed for nausea. 08/03/20   Raiford Noble Latif, DO  polyethylene glycol powder (GLYCOLAX/MIRALAX) 17 GM/SCOOP powder Take 17 g by mouth See admin instructions. Mix 17 grams as directed every morning and once daily as needed for constipation    [provider]  SANTYL ointment Apply 1 application topically See admin instructions. Apply to wound in the sacral area once a day    [provider]  tretinoin (RETIN-A) 0.025 % gel Apply 1 application topically See admin instructions. Apply to the affected areas of the face at bedtime    [provider]  omeprazole (PRILOSEC) 40 MG capsule Take 1 capsule (40 mg total) by mouth 2 (two) times daily. Patient not taking: Reported on 12/06/2020 08/15/18 12/15/20  Eugenie Filler, MD  [place] (OYSTER CALCIUM) 500 MG TABS tablet Take 1,500 mg of elemental calcium by mouth daily. Patient not taking: Reported on 12/06/2020  12/15/20  [provider]    Allergies    Adhesive [tape]  Review of Systems   Review of Systems  Respiratory:  Positive for cough.   All other systems reviewed and are negative.  Physical Exam Updated Vital Signs BP  127/83    Pulse 100    Temp 99.1 F (37.3 C) (Rectal)    Resp (!) 26    SpO2 93%   Physical Exam Vitals and nursing note reviewed.  Constitutional:      General: He is not in acute distress.    Appearance: He is well-developed. He is ill-appearing.     Comments: Chronically ill-appearing  HENT:     Head: Normocephalic and atraumatic.     Mouth/Throat:     Mouth: Mucous membranes are moist.  Eyes:     Conjunctiva/sclera: Conjunctivae normal.     Pupils: Pupils are equal, round, and reactive to light.  Cardiovascular:     Rate and Rhythm: Regular rhythm. Tachycardia present.     Heart sounds: No murmur heard. Pulmonary:     Effort: Pulmonary effort is normal. Tachypnea present. No respiratory distress.     Breath sounds: Examination of the left-upper field reveals rhonchi. Examination of the left-middle field reveals rhonchi. Rhonchi present. No wheezing or rales.  Abdominal:     General: There is no distension.     Palpations: Abdomen is soft.     Tenderness: There is no abdominal tenderness. There is no guarding or rebound.  Musculoskeletal:        General: No tenderness. Normal range of motion.     Cervical back: Normal range of motion and neck supple.     Comments: Right aka.  Left lower ext with chronic wound but no signs of erythema or cellulitis  Skin:    General: Skin is warm and dry.     Findings: No erythema or rash.  Neurological:     Mental Status: He is alert and oriented to person, place, and time. Mental status is at baseline.  Psychiatric:        Mood and Affect: Mood normal.        Behavior: Behavior normal.    ED Results / Procedures / Treatments   Labs (all labs ordered are listed, but only abnormal results are displayed) Labs Reviewed  CBC  WITH DIFFERENTIAL/PLATELET - Abnormal; Notable for the following components:      Result Value   WBC 12.4 (*)    Hemoglobin 10.6 (*)    HCT 34.1 (*)    MCV 75.1 (*)    MCH 23.3 (*)    RDW 19.1 (*)    Neutro Abs 8.8 (*)    Monocytes Absolute 1.7 (*)    Abs Immature Granulocytes 0.13 (*)    All other components within normal limits  COMPREHENSIVE METABOLIC PANEL - Abnormal; Notable for the following components:   Glucose, Bld 116 (*)    Creatinine, Ser 0.41 (*)    Calcium 8.6 (*)    Albumin 2.4 (*)    Alkaline Phosphatase 132 (*)    All other components within normal limits  LACTIC ACID, PLASMA - Abnormal; Notable for the following components:   Lactic Acid, Venous 2.8 (*)    All other components within normal limits  RESP PANEL BY RT-PCR (FLU A&B, COVID) [id number]  BRAIN NATRIURETIC PEPTIDE  TROPONIN I (HIGH SENSITIVITY)    EKG EKG Interpretation  Date/Time:  Tuesday May 22 2021 13:22:19 EST Ventricular Rate:  104 PR Interval:  118 QRS Duration: 84 QT Interval:  336 QTC Calculation: 442 R Axis:   99 Text Interpretation: Sinus tachycardia Right axis deviation Low voltage, precordial leads Borderline T abnormalities, diffuse leads No significant change since last tracing Confirmed by Blanchie Dessert [phone]) on  05/22/2021 3:16:03 PM  Radiology DG Chest Port 1 View  Result Date: 05/22/2021 CLINICAL DATA:  Cough, shortness of breath EXAM: PORTABLE CHEST 1 VIEW COMPARISON:  02/20/2021 FINDINGS: Transverse diameter of heart is increased. There is interval appearance of diffuse haziness in both lungs, more so on the left side. Both diaphragms are obscured. Costophrenic angles are indistinct. There is no pneumothorax. IMPRESSION: Cardiomegaly. Central pulmonary vessels are prominent suggesting CHF. Diffuse haziness is seen in the both lungs, more so on the left side suggesting interval appearance of moderate to large bilateral pleural effusions, more so on the left side.  Possibility of underlying pneumonia could not be evaluated due to pleural effusions. Electronically Signed   By: Elmer Picker M.D.   On: 05/22/2021 13:52    Procedures Procedures   Medications Ordered in ED Medications  lactated ringers infusion ( Intravenous New Bag/Given 05/22/21 1415)    ED Course  I have reviewed the triage vital signs and the nursing notes.  Pertinent labs & imaging results that were available during my care of the patient were reviewed by me and considered in my medical decision making (see chart for details).    MDM Rules/Calculators/A&P                         Patient is a 60 year old male with multiple medical problems presenting today with worsening cough and shortness of breath.  Concern for COVID, flu, pneumonia.  Patient is 99.1 orally here but will do a rectal temp.  He is tachypneic but sats are 97% on room air.  He has some rhonchi on the left side but no significant wheezing.  He has no history of asthma in the past.  He has no abdominal pain and denies that he has had vomiting today concerning for bowel obstruction.  We will do a rectal temp, labs and imaging is pending.  3:17 PM Patient's rectal temperature is 99, white count today of 12,000, lactate mildly elevated at 2.8, hemoglobin at its baseline of 10, CMP without acute findings, COVID and flu are negative, EKG without acute changes, chest x-ray with concern for cardiomegaly and concern for possible CHF and pleural effusions however pneumonia cannot be ruled out.  Patient does not have excessive distal edema on exam noted.  Based on his body habitus and prior CP he does not appear to have significant distention of his abdomen.  We will add on troponin and BNP.  Patient will be covered with antibiotics.  Feel that patient will need admission. Most recent echo in sept showed EF of 55%.  3:43 PM Based on CXR and tachypnea in addition to abx pt covered with lasix.  MDM   Amount and/or Complexity  of Data Reviewed Clinical lab tests: reviewed and ordered Tests in the radiology section of CPT: reviewed and ordered Tests in the medicine section of CPT: ordered and reviewed Independent visualization of images, tracings, or specimens: yes   CRITICAL CARE Performed by: Sadie Pickar Total critical care time: 30 minutes Critical care time was exclusive of separately billable procedures and treating other patients. Critical care was necessary to treat or prevent imminent or life-threatening deterioration. Critical care was time spent personally by me on the following activities: development of treatment plan with patient and/or surrogate as well as nursing, discussions with consultants, evaluation of patient's response to treatment, examination of patient, obtaining history from patient or surrogate, ordering and performing treatments and interventions, ordering and review of laboratory  studies, ordering and review of radiographic studies, pulse oximetry and re-evaluation of patient's condition.      Final Clinical Impression(s) / ED Diagnoses Final diagnoses:  Community acquired pneumonia, unspecified laterality  Acute congestive heart failure, unspecified heart failure type [facility])    Rx / DC Orders ED Discharge Orders     None        Blanchie Dessert, MD 05/22/21 [phone]

## 2021-05-23 ENCOUNTER — Other Ambulatory Visit: Payer: Self-pay

## 2021-05-23 DIAGNOSIS — E8809 Other disorders of plasma-protein metabolism, not elsewhere classified: Secondary | ICD-10-CM | POA: Diagnosis present

## 2021-05-23 DIAGNOSIS — I5032 Chronic diastolic (congestive) heart failure: Secondary | ICD-10-CM | POA: Diagnosis present

## 2021-05-23 DIAGNOSIS — I442 Atrioventricular block, complete: Secondary | ICD-10-CM | POA: Diagnosis not present

## 2021-05-23 DIAGNOSIS — R109 Unspecified abdominal pain: Secondary | ICD-10-CM | POA: Diagnosis not present

## 2021-05-23 DIAGNOSIS — E7220 Disorder of urea cycle metabolism, unspecified: Secondary | ICD-10-CM | POA: Diagnosis not present

## 2021-05-23 DIAGNOSIS — L89153 Pressure ulcer of sacral region, stage 3: Secondary | ICD-10-CM | POA: Diagnosis present

## 2021-05-23 DIAGNOSIS — I82890 Acute embolism and thrombosis of other specified veins: Secondary | ICD-10-CM | POA: Diagnosis not present

## 2021-05-23 DIAGNOSIS — R059 Cough, unspecified: Secondary | ICD-10-CM | POA: Diagnosis present

## 2021-05-23 DIAGNOSIS — J189 Pneumonia, unspecified organism: Secondary | ICD-10-CM | POA: Diagnosis not present

## 2021-05-23 DIAGNOSIS — G808 Other cerebral palsy: Secondary | ICD-10-CM | POA: Diagnosis present

## 2021-05-23 DIAGNOSIS — Z20822 Contact with and (suspected) exposure to covid-19: Secondary | ICD-10-CM | POA: Diagnosis present

## 2021-05-23 DIAGNOSIS — K5903 Drug induced constipation: Secondary | ICD-10-CM | POA: Diagnosis not present

## 2021-05-23 DIAGNOSIS — I471 Supraventricular tachycardia: Secondary | ICD-10-CM | POA: Diagnosis not present

## 2021-05-23 DIAGNOSIS — R0600 Dyspnea, unspecified: Secondary | ICD-10-CM | POA: Diagnosis not present

## 2021-05-23 DIAGNOSIS — K55069 Acute infarction of intestine, part and extent unspecified: Secondary | ICD-10-CM | POA: Diagnosis not present

## 2021-05-23 DIAGNOSIS — N139 Obstructive and reflux uropathy, unspecified: Secondary | ICD-10-CM | POA: Diagnosis present

## 2021-05-23 DIAGNOSIS — E43 Unspecified severe protein-calorie malnutrition: Secondary | ICD-10-CM | POA: Diagnosis present

## 2021-05-23 DIAGNOSIS — E440 Moderate protein-calorie malnutrition: Secondary | ICD-10-CM | POA: Diagnosis not present

## 2021-05-23 DIAGNOSIS — D509 Iron deficiency anemia, unspecified: Secondary | ICD-10-CM | POA: Diagnosis present

## 2021-05-23 DIAGNOSIS — E872 Acidosis, unspecified: Secondary | ICD-10-CM | POA: Diagnosis present

## 2021-05-23 DIAGNOSIS — Z7189 Other specified counseling: Secondary | ICD-10-CM | POA: Diagnosis not present

## 2021-05-23 DIAGNOSIS — I509 Heart failure, unspecified: Secondary | ICD-10-CM | POA: Diagnosis not present

## 2021-05-23 DIAGNOSIS — F319 Bipolar disorder, unspecified: Secondary | ICD-10-CM | POA: Diagnosis present

## 2021-05-23 DIAGNOSIS — R1310 Dysphagia, unspecified: Secondary | ICD-10-CM | POA: Diagnosis not present

## 2021-05-23 DIAGNOSIS — I81 Portal vein thrombosis: Secondary | ICD-10-CM | POA: Diagnosis not present

## 2021-05-23 DIAGNOSIS — I495 Sick sinus syndrome: Secondary | ICD-10-CM | POA: Diagnosis not present

## 2021-05-23 DIAGNOSIS — R509 Fever, unspecified: Secondary | ICD-10-CM | POA: Diagnosis not present

## 2021-05-23 DIAGNOSIS — R532 Functional quadriplegia: Secondary | ICD-10-CM | POA: Diagnosis present

## 2021-05-23 DIAGNOSIS — L98429 Non-pressure chronic ulcer of back with unspecified severity: Secondary | ICD-10-CM | POA: Diagnosis not present

## 2021-05-23 DIAGNOSIS — Z66 Do not resuscitate: Secondary | ICD-10-CM | POA: Diagnosis present

## 2021-05-23 DIAGNOSIS — Z6830 Body mass index (BMI) 30.0-30.9, adult: Secondary | ICD-10-CM | POA: Diagnosis not present

## 2021-05-23 DIAGNOSIS — J690 Pneumonitis due to inhalation of food and vomit: Secondary | ICD-10-CM | POA: Diagnosis present

## 2021-05-23 DIAGNOSIS — J90 Pleural effusion, not elsewhere classified: Secondary | ICD-10-CM | POA: Diagnosis present

## 2021-05-23 DIAGNOSIS — Z515 Encounter for palliative care: Secondary | ICD-10-CM | POA: Diagnosis not present

## 2021-05-23 DIAGNOSIS — E7800 Pure hypercholesterolemia, unspecified: Secondary | ICD-10-CM | POA: Diagnosis present

## 2021-05-23 LAB — BASIC METABOLIC PANEL
Anion gap: 6 (ref 5–15)
BUN: 15 mg/dL (ref 8–23)
CO2: 22 mmol/L (ref 22–32)
Calcium: 8.6 mg/dL — ABNORMAL LOW (ref 8.9–10.3)
Chloride: 106 mmol/L (ref 98–111)
Creatinine, Ser: 0.37 mg/dL — ABNORMAL LOW (ref 0.61–1.24)
GFR, Estimated: >60 mL/min (ref >60)
Glucose, Bld: 112 mg/dL — ABNORMAL HIGH (ref 70–99)
Potassium: 4.1 mmol/L (ref 3.5–5.1)
Sodium: 134 mmol/L — ABNORMAL LOW (ref 135–145)

## 2021-05-23 LAB — GLUCOSE, CAPILLARY: Glucose-Capillary: 100 mg/dL — ABNORMAL HIGH (ref 70–99)

## 2021-05-23 LAB — MRSA NEXT GEN BY PCR, NASAL: MRSA by PCR Next Gen: DETECTED — AB

## 2021-05-23 LAB — PROCALCITONIN: Procalcitonin: <0.1 ng/mL

## 2021-05-23 NOTE — Progress Notes (Signed)
The patient has not voided this afternoon. Bladder scan revealed 356 cc. Provider updated, see new orders

## 2021-05-23 NOTE — Consult Note (Signed)
WOC Nurse Consult Note: Patient receiving care in [facility]. Reason for Consult: sacral wound Wound type: healing stage 4 PI Pressure Injury POA: Yes Measurement: 3.2 cm x 2.8 cm x 0.4 cm Wound bed: slick pink Drainage (amount, consistency, odor) yellow and some blue/green tinge Periwound: epibole with macerated margins Dressing procedure/placement/frequency: Place saline moistened gauze into the sacral wound, cover with ABD pad or foam dressing. Foam dressing can remain in place up to 3 days. Gauze and ABD must be changed each shift.  Of note, there is a small, 0.6 cm in diameter abrasion on the medial left knee area with a foam dressing over it. The foam dressing topically should be continued to the site with changing every 3 days.  Monitor the wound area(s) for worsening of condition such as: Signs/symptoms of infection,  Increase in size,  Development of or worsening of odor, Development of pain, or increased pain at the affected locations.  Notify the medical team if any of these develop.  Thank you for the consult. WOC nurse will not follow at this time.  Please re-consult the WOC team if needed.  Helmut Muster, RN, MSN, CWOCN, CNS-BC, pager [phone]

## 2021-05-23 NOTE — Progress Notes (Signed)
Bladder scan completed 500 cc out.

## 2021-05-23 NOTE — Progress Notes (Signed)
PROGRESS NOTE  [name] [id number] DOB: 08-Sep-1960 DOA: 05/22/2021 PCP: [name] Hose, MD  HPI/Recap of past 24 hours:  [name] is a 60 y.o. male with medical history significant of chronic diastolic CHF, right foot amputee, iron deficiency anemia, anxiety, bipolar disorder, intellectual functional disability, intermittent explosive disorder, obsessive-compulsive disorder, cerebral palsy, chronic bronchitis, constipation, environmental allergies, gastric ulcer with history of UGI bleed necessitating blood transfusion, GERD, hiatal hernia, history of varicella-zoster, hyperlipidemia, osteomyelitis of right foot, history of pneumonia, pressure ulcer of sacral area, history of Pseudomonas infection, recurrent UTIs, seborrheic dermatitis, family diverticulosis, vitamin D deficiency, functional quadriplegia who was brought from his group home due to productive cough for about a week.  He has had so much sputum production though was unable to eat this morning.  He has felt chills and night sweats, but not sure about having a fever.  He has pleuritic chest pain, but no constant precordial CP, palpitations, diaphoresis, lightheadedness, abdominal pain, nausea, emesis, diarrhea, but he gets constipated easily.  He has some trouble sleeping because of the cough.  His appetite is decreased.  His sister stated that he has a history of thromboembolism.   ED Course: Initial vital signs were temperature 99.1 F, pulse 106, respirations 29, BP 131/73 mmHg O2 sat 97% on room air.  However, the patient was placed on nasal cannula oxygen.  He was started on ceftriaxone and azithromycin IVPB.  Furosemide 40 mg IVP ordered but I discontinued it after BNP was normal.  Work-up revealed community-acquired pneumonia with concern for possible aspiration.  He was started on Rocephin and azithromycin empirically.  05/23/2021: Patient was seen and examined at his bedside.  Reports having a persistent  productive cough with clear phlegm.  Denies any chest pain.  Ongoing antibiotic therapy and pulmonary toilet.  Assessment/Plan: Principal Problem:   Acute dyspnea Active Problems:   Infantile cerebral palsy (HCC)   Pressure ulcer of foot   Functional quadriplegia (HCC)   Stage 3 skin ulcer of sacral region (HCC)   Microcytic anemia   Lactic acidosis   Iron deficiency anemia   Protein-calorie malnutrition, severe   Chronic diastolic CHF (congestive heart failure) (HCC)  Acute dyspnea secondary to community-acquired pneumonia with concern for possible aspiration Speech therapist evaluation to rule out dysphagia Maintain oxygen saturation greater 92% Treat underlying conditions Continue Rocephin and azithromycin Pulmonary toilet  Bilateral pleural effusion Personally reviewed, moderate to large Incentive spirometer Flutter valve If no meaningful improvement thoracentesis tomorrow Maintain oxygen saturation > 92%  Pressure wounds Continue local wound care with wound care specialist guidance.  Chronic diastolic CHF Strict I's and O's and daily weight  Infantile cerebral palsy Functional quadriplegia Muscle relaxers as needed Analgesics as needed  Iron deficiency anemia Hemoglobin 10.6 from 11.8, 2 months ago Monitor H&H  Severe protein calorie malnutrition Continue protein supplementation   Code Status: DNR  Family Communication: None at bedside  Disposition Plan: Likely return to group home.   Consultants: None  Procedures: None  Antimicrobials: Rocephin Azithromycin  DVT prophylaxis: Subcu Lovenox daily  Status is: Inpatient  Patient requires at least 2 midnights for further evaluation and treatment.      Objective: Vitals:   05/22/21 1957 05/22/21 2028 05/23/21 0533 05/23/21 1221  BP: (!) 143/66  126/82 122/83  Pulse: (!) 111  (!) 103 (!) 110  Resp: 17 (!) 23 17 16   Temp: (!) 97.5 F (36.4 C)  98.6 F (37 C) 99 F (37.2 C)  TempSrc:  Oral   Axillary  SpO2: 98%  92% 97%    Intake/Output Summary (Last 24 hours) at 05/23/2021 1436 Last data filed at 05/23/2021 1220 Gross per 24 hour  Intake 1694.04 ml  Output 0 ml  Net 1694.04 ml   There were no vitals filed for this visit.  Exam:  General: 60 y.o. year-old male well developed well nourished in no acute distress.  Alert and oriented x3. Cardiovascular: Regular rate and rhythm with no rubs or gallops.  No thyromegaly or JVD noted.   Respiratory: Mild rales at bases.  Poor inspiratory effort.  Abdomen: Soft nontender nondistended with normal bowel sounds x4 quadrants. Musculoskeletal: Trace edema in lower extremities.  Pressure wounds, POA. Skin: Pressure wounds, POA Psychiatry: Mood is appropriate for condition and setting   Data Reviewed: CBC: Recent Labs  Lab 05/22/21 1348  WBC 12.4*  NEUTROABS 8.8*  HGB 10.6*  HCT 34.1*  MCV 75.1*  PLT [id number]   Basic Metabolic Panel: Recent Labs  Lab 05/22/21 1348 05/23/21 0345  NA 136 134*  K 4.0 4.1  CL 105 106  CO2 22 22  GLUCOSE 116* 112*  BUN 18 15  CREATININE 0.41* 0.37*  CALCIUM 8.6* 8.6*   GFR: CrCl cannot be calculated (Unknown ideal weight.). Liver Function Tests: Recent Labs  Lab 05/22/21 1348  AST 29  ALT 23  ALKPHOS 132*  BILITOT 0.5  PROT 6.9  ALBUMIN 2.4*   No results for input(s): LIPASE, AMYLASE in the last 168 hours. No results for input(s): AMMONIA in the last 168 hours. Coagulation Profile: No results for input(s): INR, PROTIME in the last 168 hours. Cardiac Enzymes: No results for input(s): CKTOTAL, CKMB, CKMBINDEX, TROPONINI in the last 168 hours. BNP (last 3 results) No results for input(s): PROBNP in the last 8760 hours. HbA1C: No results for input(s): HGBA1C in the last 72 hours. CBG: Recent Labs  Lab 05/22/21 1957 05/23/21 0742  GLUCAP 93 100*   Lipid Profile: No results for input(s): CHOL, HDL, LDLCALC, TRIG, CHOLHDL, LDLDIRECT in the last 72 hours. Thyroid  Function Tests: No results for input(s): TSH, T4TOTAL, FREET4, T3FREE, THYROIDAB in the last 72 hours. Anemia Panel: No results for input(s): VITAMINB12, FOLATE, FERRITIN, TIBC, IRON, RETICCTPCT in the last 72 hours. Urine analysis:    Component Value Date/Time   COLORURINE YELLOW (A) 02/20/2021 1432   APPEARANCEUR CLEAR (A) 02/20/2021 1432   LABSPEC >1.046 (H) 02/20/2021 1432   PHURINE 6.0 02/20/2021 1432   GLUCOSEU NEGATIVE 02/20/2021 1432   HGBUR NEGATIVE 02/20/2021 1432   HGBUR negative 06/14/2010 1119   BILIRUBINUR NEGATIVE 02/20/2021 1432   BILIRUBINUR SMALL 01/04/2016 1634   KETONESUR NEGATIVE 02/20/2021 1432   PROTEINUR 100 (A) 02/20/2021 1432   UROBILINOGEN 2.0 (H) 02/05/2018 1825   NITRITE NEGATIVE 02/20/2021 1432   LEUKOCYTESUR NEGATIVE 02/20/2021 1432   Sepsis Labs: @LABRCNTIP (procalcitonin:4,lacticidven:4)  ) Recent Results (from the past 240 hour(s))  Resp Panel by RT-PCR (Flu A&B, Covid) Nasopharyngeal Swab     Status: None   Collection Time: 05/22/21  1:48 PM   Specimen: Nasopharyngeal Swab; Nasopharyngeal(NP) swabs in vial transport medium  Result Value Ref Range Status   SARS Coronavirus 2 by RT PCR NEGATIVE NEGATIVE Final    Comment: (NOTE) SARS-CoV-2 target nucleic acids are NOT DETECTED.  The SARS-CoV-2 RNA is generally detectable in upper respiratory specimens during the acute phase of infection. The lowest concentration of SARS-CoV-2 viral copies this assay can detect is 138 copies/mL. A negative result does not preclude  SARS-Cov-2 infection and should not be used as the sole basis for treatment or other patient management decisions. A negative result may occur with  improper specimen collection/handling, submission of specimen other than nasopharyngeal swab, presence of viral mutation(s) within the areas targeted by this assay, and inadequate number of viral copies(<138 copies/mL). A negative result must be combined with clinical observations,  patient history, and epidemiological information. The expected result is Negative.  Fact Sheet for Patients:  BloggerCourse.com  Fact Sheet for Healthcare Providers:  SeriousBroker.it  This test is no t yet approved or cleared by the [place] FDA and  has been authorized for detection and/or diagnosis of SARS-CoV-2 by FDA under an Emergency Use Authorization (EUA). This EUA will remain  in effect (meaning this test can be used) for the duration of the COVID-19 declaration under Section 564(b)(1) of the Act, 21 U.S.C.section 360bbb-3(b)(1), unless the authorization is terminated  or revoked sooner.       Influenza A by PCR NEGATIVE NEGATIVE Final   Influenza B by PCR NEGATIVE NEGATIVE Final    Comment: (NOTE) The Xpert Xpress SARS-CoV-2/FLU/RSV plus assay is intended as an aid in the diagnosis of influenza from Nasopharyngeal swab specimens and should not be used as a sole basis for treatment. Nasal washings and aspirates are unacceptable for Xpert Xpress SARS-CoV-2/FLU/RSV testing.  Fact Sheet for Patients: BloggerCourse.com  Fact Sheet for Healthcare Providers: SeriousBroker.it  This test is not yet approved or cleared by the [place] FDA and has been authorized for detection and/or diagnosis of SARS-CoV-2 by FDA under an Emergency Use Authorization (EUA). This EUA will remain in effect (meaning this test can be used) for the duration of the COVID-19 declaration under Section 564(b)(1) of the Act, 21 U.S.C. section 360bbb-3(b)(1), unless the authorization is terminated or revoked.  Performed at [facility], [address], [place], [place] [address]   MRSA Next Gen by PCR, Nasal     Status: Abnormal   Collection Time: 05/23/21 11:12 AM   Specimen: Nasal Mucosa; Nasal Swab  Result Value Ref Range Status   MRSA by PCR Next Gen DETECTED (A) NOT  DETECTED Final    Comment: RESULT CALLED TO, READ BACK BY AND VERIFIED WITH: MELTON,A. RN AT 1251 05/23/21 MULLINS,T (NOTE) The GeneXpert MRSA Assay (FDA approved for NASAL specimens only), is one component of a comprehensive MRSA colonization surveillance program. It is not intended to diagnose MRSA infection nor to guide or monitor treatment for MRSA infections. Test performance is not FDA approved in patients less than 51 years old. Performed at [facility], [address] [place], [place] [address]       Studies: CT Angio Chest Pulmonary Embolism (PE) W or WO Contrast  Result Date: 05/22/2021 CLINICAL DATA:  Coughing and shortness of breath. EXAM: CT ANGIOGRAPHY CHEST WITH CONTRAST TECHNIQUE: Multidetector CT imaging of the chest was performed using the standard protocol during bolus administration of intravenous contrast. Multiplanar CT image reconstructions and MIPs were obtained to evaluate the vascular anatomy. CONTRAST:  OMNIPAQUE IOHEXOL 350 MG/ML SOLN COMPARISON:  Portable chest today, portable chest 02/20/2021, CTA chest 10/22/2020, CTA chest 09/05/2020 FINDINGS: Cardiovascular: Slightly prominent pulmonary trunk 3.1 cm. The pulmonary arteries are free of filling defects at least through the segmental divisions. The subsegmental arteries are obscured due to breathing motion artifact and atelectasis related to bilateral pleural effusions. The cardiac size is normal. There is a small anterior pericardial effusion which is increased. Scattered calcification LAD  and circumflex coronary arteries. There is aortic tortuosity with minimal atherosclerosis, normal caliber aorta and great vessels no dissection , no significant venous distention. Mediastinum/Nodes: Moderate-to-large size hiatal hernia with surgical clips posterior to the hernia sac. Contrast is again noted in the posterior aspect of the distal esophagus/hernia. No intrathoracic or axillary adenopathy is  seen. No visible thyroid mass. There is no tracheal filling defect. Lungs/Pleura: There is a large left and moderate to large right layering pleural effusions both increased over prior studies. Left pleural effusion completely collapses the left lower lobe and much of the posterior left upper lobe. The right pleural effusion partially collapses the lower lobe. The mediastinum again noted shifted to the right. Chronic micronodular disease again noted laterally in the right middle lobe. There are scattered atelectatic bands in the right middle lobe. Underlying pneumonia would be difficult to exclude in the collapsed portions of the lungs. Central airways are small in caliber which could be from respiratory phase or bronchospasm with bronchial thickening noted centrally on both sides and increased. Upper Abdomen: There is body wall anasarca and mesenteric edema noted diffusely, moderate free ascites. There is wall thickening versus nondistention the visualized portions of the colon. Appendix steatosis. Musculoskeletal: There is extensive bridging enthesopathy the anterior thoracic spine and multilevel bridging syndesmophytes, osteopenia. Review of the MIP images confirms the above findings. IMPRESSION: 1. Slightly prominent pulmonary trunk. No embolus is seen through the segmental divisions. The subsegmental arterial bed is either obscured by breathing motion or large effusions. 2. Within normal limits cardiac size with 2-vessel calcific CAD. Aortic atherosclerosis. 3. There is body wall anasarca, moderate mesenteric edema and upper abdominal ascites, large left and moderate to large right pleural effusions. Findings could be congestive or from fluid overload. 4. The left lower lobe is collapsed by the pleural effusion with atelectasis in the posterior aspects of the left upper lobe and abutting right lung. Underlying pneumonia would be difficult to exclude. Changes of chronic small airways disease in the lateral right  middle lobe. 5. Small caliber central airways with bronchial thickening suggesting bronchitis. The central airways could be small in caliber due to respiratory phase or bronchospasm. 6. Moderate-to-large hiatal hernia. 7. Thickening of the colonic wall versus nondistention. Electronically Signed   By: Telford Nab M.D.   On: 05/22/2021 23:31    Scheduled Meds:  vitamin C  250 mg Oral Daily   baclofen  20 mg Oral TID   bisacodyl  10 mg Rectal QODAY   calcium carbonate  3 tablet Oral Q breakfast   cholecalciferol  5,000 Units Oral Daily   clotrimazole  1 application Topical BID   diclofenac Sodium  4 g Topical QID   divalproex  500 mg Oral BID   enoxaparin (LOVENOX) injection  40 mg Subcutaneous Q24H   feeding supplement  237 mL Oral BID BM   fesoterodine  4 mg Oral Daily   fluticasone  1 spray Each Nare Daily   hydrocortisone cream  1 application Topical BID   ipratropium-albuterol  3 mL Nebulization Once   iron polysaccharides  150 mg Oral Q1200   [START ON 05/24/2021] ketoconazole  1 application Topical Once per day on Mon Thu   lactulose  15 g Oral Daily   lisinopril  2.5 mg Oral Daily   loratadine  10 mg Oral Daily   pantoprazole  40 mg Oral Daily   polyethylene glycol  17 g Oral BID   risperiDONE  2 mg Oral QHS   senna-docusate  1 tablet Oral QHS    Continuous Infusions:  azithromycin     cefTRIAXone (ROCEPHIN)  IV       LOS: 0 days     Kayleen Memos, MD [facility] Pager [phone]  If 7PM-7AM, please contact night-coverage [email or web address] Password [facility] 05/23/2021, 2:36 PM

## 2021-05-23 NOTE — Progress Notes (Signed)
°   05/22/21 1957  Assess: MEWS Score  Temp (!) 97.5 F (36.4 C)  BP (!) 143/66  Pulse Rate (!) 111  Resp 17  SpO2 98 %  O2 Device Nasal Cannula  Assess: MEWS Score  MEWS Temp 0  MEWS Systolic 0  MEWS Pulse 2  MEWS RR 0  MEWS LOC 0  MEWS Score 2  MEWS Score Color Yellow  Assess: if the MEWS score is Yellow or Red  Were vital signs taken at a resting state? Yes  Focused Assessment No change from prior assessment  Does the patient meet 2 or more of the SIRS criteria? No  MEWS guidelines implemented *See Row Information* Yes  Treat  MEWS Interventions Administered scheduled meds/treatments;Administered prn meds/treatments  Pain Scale 0-10  Pain Score 8  Pain Type Chronic pain  Pain Location Sacrum  Pain Orientation Mid  Pain Descriptors / Indicators Aching  Pain Onset On-going  Patients Stated Pain Goal 0  Pain Intervention(s) Medication (See eMAR)  Take Vital Signs  Increase Vital Sign Frequency  Yellow: Q 2hr X 2 then Q 4hr X 2, if remains yellow, continue Q 4hrs  Escalate  MEWS: Escalate Yellow: discuss with charge nurse/RN and consider discussing with provider and RRT  Notify: Charge Nurse/RN  Name of Charge Nurse/RN Notified Pam RN  Date Charge Nurse/RN Notified 05/23/21  Time Charge Nurse/RN Notified 0001  Assess: SIRS CRITERIA  SIRS Temperature  0  SIRS Pulse 1  SIRS Respirations  0  SIRS WBC 0  SIRS Score Sum  1

## 2021-05-24 ENCOUNTER — Inpatient Hospital Stay (HOSPITAL_COMMUNITY): Payer: Medicare Other

## 2021-05-24 DIAGNOSIS — R0600 Dyspnea, unspecified: Secondary | ICD-10-CM | POA: Diagnosis not present

## 2021-05-24 LAB — CBC
HCT: 32.7 % — ABNORMAL LOW (ref 39.0–52.0)
Hemoglobin: 10.3 g/dL — ABNORMAL LOW (ref 13.0–17.0)
MCH: 23.5 pg — ABNORMAL LOW (ref 26.0–34.0)
MCHC: 31.5 g/dL (ref 30.0–36.0)
MCV: 74.5 fL — ABNORMAL LOW (ref 80.0–100.0)
Platelets: 493 10*3/uL — ABNORMAL HIGH (ref 150–400)
RBC: 4.39 MIL/uL (ref 4.22–5.81)
RDW: 18.9 % — ABNORMAL HIGH (ref 11.5–15.5)
WBC: 10.1 10*3/uL (ref 4.0–10.5)
nRBC: 0.3 % — ABNORMAL HIGH (ref 0.0–0.2)

## 2021-05-24 LAB — BODY FLUID CELL COUNT WITH DIFFERENTIAL
Lymphs, Fluid: 75 %
Monocyte-Macrophage-Serous Fluid: 25 % — ABNORMAL LOW (ref 50–90)
Total Nucleated Cell Count, Fluid: 2811 cu mm — ABNORMAL HIGH (ref 0–1000)

## 2021-05-24 LAB — BASIC METABOLIC PANEL
Anion gap: 10 (ref 5–15)
BUN: 21 mg/dL (ref 8–23)
CO2: 23 mmol/L (ref 22–32)
Calcium: 8.7 mg/dL — ABNORMAL LOW (ref 8.9–10.3)
Chloride: 102 mmol/L (ref 98–111)
Creatinine, Ser: 0.47 mg/dL — ABNORMAL LOW (ref 0.61–1.24)
GFR, Estimated: >60 mL/min (ref >60)
Glucose, Bld: 98 mg/dL (ref 70–99)
Potassium: 4.4 mmol/L (ref 3.5–5.1)
Sodium: 135 mmol/L (ref 135–145)

## 2021-05-24 LAB — LACTATE DEHYDROGENASE, PLEURAL OR PERITONEAL FLUID: LD, Fluid: 121 U/L — ABNORMAL HIGH (ref 3–23)

## 2021-05-24 LAB — GLUCOSE, PLEURAL OR PERITONEAL FLUID: Glucose, Fluid: 105 mg/dL

## 2021-05-24 LAB — ALBUMIN, PLEURAL OR PERITONEAL FLUID: Albumin, Fluid: 1.6 g/dL

## 2021-05-24 MED ORDER — LIDOCAINE HCL 1 % IJ SOLN
INTRAMUSCULAR | Status: AC
  Administered 2021-05-24: 11:00:00 8 mL
  Filled 2021-05-24: qty 20

## 2021-05-24 MED ORDER — AZITHROMYCIN 250 MG PO TABS
500.0000 mg | ORAL_TABLET | Freq: Every day | ORAL | Status: AC
  Administered 2021-05-24 – 2021-05-26 (×3): 500 mg via ORAL
  Filled 2021-05-24 (×3): qty 2

## 2021-05-24 MED ORDER — IPRATROPIUM-ALBUTEROL 0.5-2.5 (3) MG/3ML IN SOLN
3.0000 mL | Freq: Three times a day (TID) | RESPIRATORY_TRACT | Status: DC
  Administered 2021-05-24 – 2021-05-25 (×2): 3 mL via RESPIRATORY_TRACT
  Filled 2021-05-24 (×2): qty 3

## 2021-05-24 MED ORDER — IPRATROPIUM-ALBUTEROL 0.5-2.5 (3) MG/3ML IN SOLN
3.0000 mL | Freq: Four times a day (QID) | RESPIRATORY_TRACT | Status: DC
  Administered 2021-05-24: 14:00:00 3 mL via RESPIRATORY_TRACT
  Filled 2021-05-24: qty 3

## 2021-05-24 NOTE — Progress Notes (Signed)
SLP Cancellation Note  Patient Details Name: [name] MRN: [id number] DOB: Jan 10, 1961   Cancelled treatment:       Reason Eval/Treat Not Completed: Patient at procedure or test/unavailable-will continue efforts.  Cobie Marcoux L. Samson Frederic, MA CCC/SLP Acute Rehabilitation Services Office number [phone] Pager [phone]    Blenda Mounts Laurice 05/24/2021, 12:00 PM

## 2021-05-24 NOTE — Plan of Care (Signed)

## 2021-05-24 NOTE — Progress Notes (Signed)
PROGRESS NOTE  [name] [id number] DOB: [birth year] DOA: 05/22/2021 PCP: Audley Hose, MD  HPI/Recap of past 24 hours:  [name] is a 60 y.o. male with medical history significant of chronic diastolic CHF, right foot amputee, iron deficiency anemia, anxiety, bipolar disorder, intellectual functional disability, intermittent explosive disorder, obsessive-compulsive disorder, cerebral palsy, chronic bronchitis, constipation, environmental allergies, gastric ulcer with history of UGI bleed necessitating blood transfusion, GERD, hiatal hernia, history of varicella-zoster, hyperlipidemia, osteomyelitis of right foot, history of pneumonia, pressure ulcer of sacral area, history of Pseudomonas infection, recurrent UTIs, seborrheic dermatitis, family diverticulosis, vitamin D deficiency, functional quadriplegia who was brought from his group home due to productive cough for about a week.  He has had so much sputum production though was unable to eat this morning.  He has felt chills and night sweats, but not sure about having a fever.  He has pleuritic chest pain, but no constant precordial CP, palpitations, diaphoresis, lightheadedness, abdominal pain, nausea, emesis, diarrhea, but he gets constipated easily.  He has some trouble sleeping because of the cough.  His appetite is decreased.  His sister stated that he has a history of thromboembolism.   ED Course: Initial vital signs were temperature 99.1 F, pulse 106, respirations 29, BP 131/73 mmHg O2 sat 97% on room air.  However, the patient was placed on nasal cannula oxygen.  He was started on ceftriaxone and azithromycin IVPB.  Furosemide 40 mg IVP ordered but I discontinued it after BNP was normal.  Work-up revealed community-acquired pneumonia with concern for possible aspiration.  He was started on Rocephin and azithromycin empirically.  05/24/2021: Plan for R thoracentesis by IR.  420 cc of yellow fluid  removed.    Assessment/Plan: Principal Problem:   Acute dyspnea Active Problems:   Infantile cerebral palsy (HCC)   Pressure ulcer of foot   Functional quadriplegia (HCC)   Stage 3 skin ulcer of sacral region (HCC)   Microcytic anemia   Lactic acidosis   Iron deficiency anemia   Protein-calorie malnutrition, severe   Chronic diastolic CHF (congestive heart failure) (HCC)  Acute dyspnea secondary to community-acquired pneumonia with concern for possible aspiration Speech therapist evaluation to rule out dysphagia Maintain oxygen saturation greater 92% Treat underlying conditions Continue Rocephin and azithromycin Pulmonary toilet  Bilateral pleural effusions, s/p right thoracentesis by IR on 05/24/2021 420 cc of yellow fluid removed. Incentive spirometer Flutter valve If no meaningful improvement thoracentesis tomorrow Maintain oxygen saturation > 92%  Pressure wounds Continue local wound care with wound care specialist guidance.  Chronic diastolic CHF Strict I's and O's and daily weight  Infantile cerebral palsy Functional quadriplegia Muscle relaxers as needed Analgesics as needed  Iron deficiency anemia Hemoglobin 10.6 from 11.8, 2 months ago Monitor H&H  Severe protein calorie malnutrition Continue protein supplementation   Code Status: DNR  Family Communication: None at bedside  Disposition Plan: Likely return to group home.   Consultants: None  Procedures: None  Antimicrobials: Rocephin Azithromycin  DVT prophylaxis: Subcu Lovenox daily  Status is: Inpatient  Patient requires at least 2 midnights for further evaluation and treatment.      Objective: Vitals:   05/24/21 0600 05/24/21 1116 05/24/21 1148 05/24/21 1409  BP: 100/60 96/67 (!) 95/58 113/72  Pulse: 98   (!) 102  Resp: 17   20  Temp: 98 F (36.7 C)   (!) 100.4 F (38 C)  TempSrc: Oral   Oral  SpO2: 93%   97%  Intake/Output Summary (Last 24 hours) at 05/24/2021  1751 Last data filed at 05/24/2021 1700 Gross per 24 hour  Intake 1077 ml  Output 800 ml  Net 277 ml   There were no vitals filed for this visit.  Exam:  General: 60 y.o. year-old male well developed well nourished in no acute distress.  Alert and oriented x3. Cardiovascular: Regular rate and rhythm with no rubs or gallops.  No thyromegaly or JVD noted.   Respiratory: Mild rales at bases.  Poor inspiratory effort.  Abdomen: Soft nontender nondistended with normal bowel sounds x4 quadrants. Musculoskeletal: Trace edema in lower extremities.  Pressure wounds, POA. Skin: Pressure wounds, POA Psychiatry: Mood is appropriate for condition and setting   Data Reviewed: CBC: Recent Labs  Lab 05/22/21 1348 05/24/21 0345  WBC 12.4* 10.1  NEUTROABS 8.8*  --   HGB 10.6* 10.3*  HCT 34.1* 32.7*  MCV 75.1* 74.5*  PLT 359 493*   Basic Metabolic Panel: Recent Labs  Lab 05/22/21 1348 05/23/21 0345 05/24/21 0351  NA 136 134* 135  K 4.0 4.1 4.4  CL 105 106 102  CO2 22 22 23   GLUCOSE 116* 112* 98  BUN 18 15 21   CREATININE 0.41* 0.37* 0.47*  CALCIUM 8.6* 8.6* 8.7*   GFR: CrCl cannot be calculated (Unknown ideal weight.). Liver Function Tests: Recent Labs  Lab 05/22/21 1348  AST 29  ALT 23  ALKPHOS 132*  BILITOT 0.5  PROT 6.9  ALBUMIN 2.4*   No results for input(s): LIPASE, AMYLASE in the last 168 hours. No results for input(s): AMMONIA in the last 168 hours. Coagulation Profile: No results for input(s): INR, PROTIME in the last 168 hours. Cardiac Enzymes: No results for input(s): CKTOTAL, CKMB, CKMBINDEX, TROPONINI in the last 168 hours. BNP (last 3 results) No results for input(s): PROBNP in the last 8760 hours. HbA1C: No results for input(s): HGBA1C in the last 72 hours. CBG: Recent Labs  Lab 05/22/21 1957 05/23/21 0742  GLUCAP 93 100*   Lipid Profile: No results for input(s): CHOL, HDL, LDLCALC, TRIG, CHOLHDL, LDLDIRECT in the last 72 hours. Thyroid  Function Tests: No results for input(s): TSH, T4TOTAL, FREET4, T3FREE, THYROIDAB in the last 72 hours. Anemia Panel: No results for input(s): VITAMINB12, FOLATE, FERRITIN, TIBC, IRON, RETICCTPCT in the last 72 hours. Urine analysis:    Component Value Date/Time   COLORURINE YELLOW (A) 02/20/2021 1432   APPEARANCEUR CLEAR (A) 02/20/2021 1432   LABSPEC >1.046 (H) 02/20/2021 1432   PHURINE 6.0 02/20/2021 1432   GLUCOSEU NEGATIVE 02/20/2021 1432   HGBUR NEGATIVE 02/20/2021 1432   HGBUR negative 06/14/2010 1119   BILIRUBINUR NEGATIVE 02/20/2021 1432   BILIRUBINUR SMALL 01/04/2016 1634   KETONESUR NEGATIVE 02/20/2021 1432   PROTEINUR 100 (A) 02/20/2021 1432   UROBILINOGEN 2.0 (H) 02/05/2018 1825   NITRITE NEGATIVE 02/20/2021 1432   LEUKOCYTESUR NEGATIVE 02/20/2021 1432   Sepsis Labs: @LABRCNTIP (procalcitonin:4,lacticidven:4)  ) Recent Results (from the past 240 hour(s))  Resp Panel by RT-PCR (Flu A&B, Covid) Nasopharyngeal Swab     Status: None   Collection Time: 05/22/21  1:48 PM   Specimen: Nasopharyngeal Swab; Nasopharyngeal(NP) swabs in vial transport medium  Result Value Ref Range Status   SARS Coronavirus 2 by RT PCR NEGATIVE NEGATIVE Final    Comment: (NOTE) SARS-CoV-2 target nucleic acids are NOT DETECTED.  The SARS-CoV-2 RNA is generally detectable in upper respiratory specimens during the acute phase of infection. The lowest concentration of SARS-CoV-2 viral copies this assay can detect is  138 copies/mL. A negative result does not preclude SARS-Cov-2 infection and should not be used as the sole basis for treatment or other patient management decisions. A negative result may occur with  improper specimen collection/handling, submission of specimen other than nasopharyngeal swab, presence of viral mutation(s) within the areas targeted by this assay, and inadequate number of viral copies(<138 copies/mL). A negative result must be combined with clinical observations,  patient history, and epidemiological information. The expected result is Negative.  Fact Sheet for Patients:  EntrepreneurPulse.com.au  Fact Sheet for Healthcare Providers:  IncredibleEmployment.be  This test is no t yet approved or cleared by the [place] FDA and  has been authorized for detection and/or diagnosis of SARS-CoV-2 by FDA under an Emergency Use Authorization (EUA). This EUA will remain  in effect (meaning this test can be used) for the duration of the COVID-19 declaration under Section 564(b)(1) of the Act, 21 U.S.C.section 360bbb-3(b)(1), unless the authorization is terminated  or revoked sooner.       Influenza A by PCR NEGATIVE NEGATIVE Final   Influenza B by PCR NEGATIVE NEGATIVE Final    Comment: (NOTE) The Xpert Xpress SARS-CoV-2/FLU/RSV plus assay is intended as an aid in the diagnosis of influenza from Nasopharyngeal swab specimens and should not be used as a sole basis for treatment. Nasal washings and aspirates are unacceptable for Xpert Xpress SARS-CoV-2/FLU/RSV testing.  Fact Sheet for Patients: EntrepreneurPulse.com.au  Fact Sheet for Healthcare Providers: IncredibleEmployment.be  This test is not yet approved or cleared by the [place] FDA and has been authorized for detection and/or diagnosis of SARS-CoV-2 by FDA under an Emergency Use Authorization (EUA). This EUA will remain in effect (meaning this test can be used) for the duration of the COVID-19 declaration under Section 564(b)(1) of the Act, 21 U.S.C. section 360bbb-3(b)(1), unless the authorization is terminated or revoked.  Performed at [facility], [place] [address], [place], [place] [address]   MRSA Next Gen by PCR, Nasal     Status: Abnormal   Collection Time: 05/23/21 11:12 AM   Specimen: Nasal Mucosa; Nasal Swab  Result Value Ref Range Status   MRSA by PCR Next Gen DETECTED (A) NOT  DETECTED Final    Comment: RESULT CALLED TO, READ BACK BY AND VERIFIED WITH: MELTON,A. RN AT [id number] 05/23/21 MULLINS,T (NOTE) The GeneXpert MRSA Assay (FDA approved for NASAL specimens only), is one component of a comprehensive MRSA colonization surveillance program. It is not intended to diagnose MRSA infection nor to guide or monitor treatment for MRSA infections. Test performance is not FDA approved in patients less than 40 years old. Performed at [facility], [place] [address], [place], [place] [address]       Studies: DG CHEST PORT 1 VIEW  Result Date: 05/24/2021 CLINICAL DATA:  Status post right thoracentesis. EXAM: PORTABLE CHEST 1 VIEW COMPARISON:  Radiograph dated December 27th 2022 FINDINGS: Patient kyphosis somewhat limits evaluation. Cardiac and mediastinal contours are not well visualized. Large left pleural effusion. Bibasilar atelectasis. No evidence of pneumothorax. IMPRESSION: No evidence of pneumothorax. Electronically Signed   By: Yetta Glassman M.D.   On: 05/24/2021 12:13   US THORACENTESIS ASP PLEURAL SPACE W/IMG GUIDE  Result Date: 05/24/2021 INDICATION: Cough, pleural effusion, shortness of breath EXAM: ULTRASOUND GUIDED right THORACENTESIS MEDICATIONS: None. COMPLICATIONS: None immediate. PROCEDURE: An ultrasound guided thoracentesis was thoroughly discussed with the patient and questions answered. The benefits, risks, alternatives and complications were also discussed. The patient understands and wishes to proceed  with the procedure. Written consent was obtained. Ultrasound was performed to localize and mark an adequate pocket of fluid in the right chest. The area was then prepped and draped in the normal sterile fashion. 1% Lidocaine was used for local anesthesia. Under ultrasound guidance a 6 Fr Safe-T-Centesis catheter was introduced. Thoracentesis was performed. The catheter was removed and a dressing applied. FINDINGS: A total of approximately  420 of yellow fluid was removed. Samples were sent to the laboratory as requested by the clinical team. IMPRESSION: Successful ultrasound guided right thoracentesis yielding yellow of pleural fluid. Performed and dictated by Pasty Spillers, PA-C Electronically Signed   By: Ruthann Cancer M.D.   On: 05/24/2021 14:24    Scheduled Meds:  vitamin C  250 mg Oral Daily   azithromycin  500 mg Oral Daily   baclofen  20 mg Oral TID   bisacodyl  10 mg Rectal QODAY   calcium carbonate  3 tablet Oral Q breakfast   cholecalciferol  5,000 Units Oral Daily   clotrimazole  1 application Topical BID   diclofenac Sodium  4 g Topical QID   divalproex  500 mg Oral BID   enoxaparin (LOVENOX) injection  40 mg Subcutaneous Q24H   feeding supplement  237 mL Oral BID BM   fesoterodine  4 mg Oral Daily   fluticasone  1 spray Each Nare Daily   hydrocortisone cream  1 application Topical BID   ipratropium-albuterol  3 mL Nebulization TID   iron polysaccharides  150 mg Oral Q1200   ketoconazole  1 application Topical Once per day on Mon Thu   lactulose  15 g Oral Daily   lisinopril  2.5 mg Oral Daily   loratadine  10 mg Oral Daily   pantoprazole  40 mg Oral Daily   polyethylene glycol  17 g Oral BID   risperiDONE  2 mg Oral QHS   senna-docusate  1 tablet Oral QHS    Continuous Infusions:  cefTRIAXone (ROCEPHIN)  IV 1 g (05/24/21 1552)     LOS: 1 day     Kayleen Memos, MD [facility] Pager [phone]  If 7PM-7AM, please contact night-coverage [email or web address] Password [facility] 05/24/2021, 5:51 PM

## 2021-05-24 NOTE — Evaluation (Signed)
Clinical/Bedside Swallow Evaluation Patient Details  Name: [name] MRN: [id number] Date of Birth: [birth year]  Today's Date: 05/24/2021 Time: SLP Start Time (ACUTE ONLY): 1445 SLP Stop Time (ACUTE ONLY): 1527 SLP Time Calculation (min) (ACUTE ONLY): 42 min  Past Medical History:  Past Medical History:  Diagnosis Date   Acute on chronic diastolic CHF (congestive heart failure) (HCC) 05/22/2021   Amputee 06/2020   right foot   Anemia    microcytic anemia    Anxiety    takes Ativan daily as needed   Bipolar disorder (HCC)    takes Depakote daily   CEREBRAL PALSY 07/24/2006   Chronic bronchitis (HCC)    "gets it q yr"   Chronic bronchitis (HCC)    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)    Constipation    takes Miralax daily as needed   Depression    takes Risperdal nightly   Environmental allergies    takes Zyrtec daily   GASTRIC ULCER ACUTE WITH HEMORRHAGE 07/24/2006   Gastric ulcer with hemorrhage    hx of    GERD (gastroesophageal reflux disease)    takes Pravacid daily   History of blood transfusion    no abnormal reaction noted   History of hiatal hernia    History of shingles    History of stomach ulcers ~ 1996   Hypercholesterolemia    Intellectual functioning disability    Intermittent explosive disorder    Internal hemorrhoids    MRSA infection 09/06/2019   Obsessive compulsive disorder    Osteomyelitis (HCC)    right foot    Pneumonia "quite a few times"   in 2015 and in June 2016   Pressure ulcer of sacral region    hx of    Pseudomonas aeruginosa infection 09/06/2019   Psychotic disorder (HCC)    Recurrent UTI (urinary tract infection)    Seborrheic dermatitis    Severe sepsis (HCC)    Sigmoid diverticulosis    Spastic tetraplegia (HCC)    URINARY INCONTINENCE 02/09/2010   takes Detrol daily-occasionally incontinence   Vitamin D deficiency    Weakness    numbness in extremities   Past Surgical History:  Past  Surgical History:  Procedure Laterality Date   AMPUTATION Right 12/14/2014   Procedure: AMPUTATION DIGIT RIGHT GREAT TOE MTP;  Surgeon: [name] Mustard, [name];  Location: [facility] OR;  Service: Orthopedics;  Laterality: Right;   AMPUTATION Right 10/08/2019   Procedure: RIGHT FIFTH RAY AMPUTATION, ADJACENT TISSUE TRANSFER;  Surgeon: [name], [name];  Location: [facility];  Service: Podiatry;  Laterality: Right;   AMPUTATION Right 07/27/2020   Procedure: AMPUTATION BELOW KNEE;  Surgeon: [name] Arthurs, [name];  Location: [facility];  Service: Orthopedics;  Laterality: Right;    BIOPSY  08/12/2018   Procedure: BIOPSY;  Surgeon: [name] Bologna, [name];  Location: [facility];  Service: Gastroenterology;;   COLONOSCOPY WITH PROPOFOL N/A 08/14/2018   Procedure: COLONOSCOPY WITH PROPOFOL;  Surgeon: [name] Bologna, [name];  Location: [facility];  Service: Gastroenterology;  Laterality: N/A;   ESOPHAGOGASTRODUODENOSCOPY (EGD) WITH PROPOFOL N/A 08/12/2018   Procedure: ESOPHAGOGASTRODUODENOSCOPY (EGD) WITH PROPOFOL;  Surgeon: [name] Bologna, [name];  Location: [facility];  Service: Gastroenterology;  Laterality: N/A;   FLEXIBLE SIGMOIDOSCOPY N/A 11/09/2020   Procedure: FLEXIBLE SIGMOIDOSCOPY;  Surgeon: [name] Danas, [name];  Location: [facility];  Service: Gastroenterology;  Laterality: N/A;   GRAFT APPLICATION Right 11/12/2019   Procedure: SKIN GRAFT SUBSTITUTE APPLICATION;  Surgeon: [name], [name];  Location: [facility];  Service: Podiatry;  Laterality: Right;   HERNIA REPAIR     HIATAL HERNIA REPAIR  1982   INCISION AND DRAINAGE ABSCESS N/A 12/08/2020   Procedure: INCISION AND DRAINAGE SACRAL DECUBITUS;  Surgeon: [name] Hatch De Blanch, [name];  Location: [facility];  Service: General;  Laterality: N/A;  45 MIN   LEG SURGERY Bilateral ~ 1967   "legs were scissored; stretched them out"   WOUND DEBRIDEMENT Right 11/12/2019   Procedure: DEBRIDEMENT WOUND;  Surgeon: [name], [name];  Location: [facility];  Service: Podiatry;  Laterality:  Right;   HPI:  [name] is a 60 y.o. male  who was brought from his group home due to productive cough for about a week. Dx community-acquired pneumonia with concern for aspiration. CXR bilateral pl effusions.  Thoracentesis 12/29.  Prior medical history significant for chronic diastolic CHF, right foot amputee, iron deficiency anemia, anxiety, bipolar disorder, intellectual functional disability, intermittent explosive disorder, obsessive-compulsive disorder, cerebral palsy, chronic bronchitis, constipation, environmental allergies, gastric ulcer with history of UGI bleed necessitating blood transfusion, GERD, hiatal hernia, history of varicella-zoster, hyperlipidemia, history of pneumonia, pressure ulcer of sacral area, history of Pseudomonas infection, recurrent UTIs, seborrheic dermatitis,, functional quadriplegia. EGD 07/2018 tortuous lower 1/3 of esophagus, large HH.  Evaluated by SLP during recent admissions (March, April, and May of 2022).  MBS 09/07/20: mild dysphagia, mostly oral; no aspiration; strong pharyngeal swallow. He tends to be symptomatic regarding esophageal deficits, with belching/backflow/regurgitation.  Pt was noted to be at increased risk of aspiration and aspiration-related complications at baseline due to his bed-bound status, his being difficult to position adequately during p.o. intake, his frequent dependence for feeding, and baseline esophageal issues.  He was D/Cd at that time (09/2020) on a dysphagia 2 diet with thin liquids.    Assessment / Plan / Recommendation  Clinical Impression  Pt presents with a likely acute-on-chronic dysphagia with decompensation and potential for adverse consequences.  [name] has been seen for his swallowing during four admissions this year, including currently.  He describes baseline issues with regurgitation - esophageal deficits/tortuous esophagus are documented from prior evaluations.  He endorses coughing intermittently with POs at  baseline. He coughed constantly during assessment today, making it difficult to discern any relationship to PO intake. Current CXR shows bibasilar infiltrates. His BS are clear but diminished.    Recommend continuing current diet (regular/thins) with emphasis on upright positioning and small sips/bites, as well as slowing down when eating/drinking (reiterated these recs from prior admissions). SLP will follow for education/safety and to determine if repeat MBS would be helpful.  Pt agrees with plan. SLP Visit Diagnosis: Dysphagia, pharyngoesophageal phase (R13.14);Dysphagia, oral phase (R13.11)    Aspiration Risk  Mild aspiration risk    Diet Recommendation   Regular solids, thin liquids  Medication Administration: Whole meds with puree    Other  Recommendations Oral Care Recommendations: Oral care BID    Recommendations for follow up therapy are one component of a multi-disciplinary discharge planning process, led by the attending physician.  Recommendations may be updated based on patient status, additional functional criteria and insurance authorization.  Follow up Recommendations No SLP follow up      Assistance Recommended at Discharge    Functional Status Assessment Patient has had a recent decline in their functional status and demonstrates the ability to make significant improvements in function in a reasonable and predictable amount of time.  Frequency and Duration min 2x/week  1 week  Prognosis        Swallow Study   General HPI: [name] is a 60 y.o. male  who was brought from his group home due to productive cough for about a week. Dx community-acquired pneumonia with concern for aspiration. CXR bilateral pl effusions.  Thoracentesis 12/29.  Prior medical history significant for chronic diastolic CHF, right foot amputee, iron deficiency anemia, anxiety, bipolar disorder, intellectual functional disability, intermittent explosive disorder, obsessive-compulsive  disorder, cerebral palsy, chronic bronchitis, constipation, environmental allergies, gastric ulcer with history of UGI bleed necessitating blood transfusion, GERD, hiatal hernia, history of varicella-zoster, hyperlipidemia, history of pneumonia, pressure ulcer of sacral area, history of Pseudomonas infection, recurrent UTIs, seborrheic dermatitis,, functional quadriplegia. EGD 07/2018 tortuous lower 1/3 of esophagus, large HH.  Evaluated by SLP during recent admissions (March, April, and May of 2022).  MBS 09/07/20: mild dysphagia, mostly oral; no aspiration; strong pharyngeal swallow. He tends to be symptomatic regarding esophageal deficits, with belching/backflow/regurgitation.  Pt was noted to be at increased risk of aspiration and aspiration-related complications at baseline due to his bed-bound status, his being difficult to position adequately during p.o. intake, his frequent dependence for feeding, and baseline esophageal issues.  He was D/Cd at that time (09/2020) on a dysphagia 2 diet with thin liquids. Type of Study: Bedside Swallow Evaluation Previous Swallow Assessment: see HPI Diet Prior to this Study: Regular;Thin liquids Temperature Spikes Noted: Yes Respiratory Status: Nasal cannula History of Recent Intubation: No Behavior/Cognition: Alert;Cooperative;Pleasant mood Oral Cavity Assessment: Within Functional Limits Oral Care Completed by SLP: Recent completion by staff Oral Cavity - Dentition: Adequate natural dentition Vision: Functional for self-feeding Self-Feeding Abilities: Total assist Patient Positioning: Upright in bed Baseline Vocal Quality: Normal Volitional Cough: Strong Volitional Swallow: Able to elicit    Oral/Motor/Sensory Function     Ice Chips Ice chips: Within functional limits   Thin Liquid Thin Liquid: Impaired Presentation: Straw Pharyngeal  Phase Impairments: Cough - Delayed    Nectar Thick Nectar Thick Liquid: Not tested   Honey Thick Honey Thick Liquid:  Not tested   Puree Puree: Impaired Presentation: Spoon Pharyngeal Phase Impairments: Cough - Delayed   Solid     Solid: Impaired Pharyngeal Phase Impairments: Cough - Delayed      Blenda Mounts Laurice 05/24/2021,3:46 PM  Sofija Antwi L. Samson Frederic, MA CCC/SLP Acute Rehabilitation Services Office number [phone] Pager [phone]

## 2021-05-24 NOTE — Procedures (Signed)
PROCEDURE SUMMARY:  Successful US guided right thoracentesis. Yielded 420 of yellow fluid. Pt tolerated procedure well. No immediate complications.  Specimen was sent for labs. CXR ordered.  EBL < 5 mL  Sheldon Amara PA-C 05/24/2021 2:08 PM

## 2021-05-24 NOTE — Progress Notes (Signed)
[facility] ([facility])  Hospital Liaison: RN note         This patient has been referred to our palliative care services in the community.  [facility] will continue to follow for any discharge planning needs and to coordinate continuation of palliative care in the outpatient setting.    If you have questions or need assistance, please call [phone] or contact the hospital Liaison listed on AMION.      Thank you for this referral.         Mary Anne Robertson, RN, CCM  [facility] Hospital Liaison   [phone] 

## 2021-05-25 ENCOUNTER — Inpatient Hospital Stay (HOSPITAL_COMMUNITY): Payer: Medicare Other

## 2021-05-25 DIAGNOSIS — R0600 Dyspnea, unspecified: Secondary | ICD-10-CM | POA: Diagnosis not present

## 2021-05-25 LAB — BASIC METABOLIC PANEL
Anion gap: 7 (ref 5–15)
BUN: 18 mg/dL (ref 8–23)
CO2: 24 mmol/L (ref 22–32)
Calcium: 8.4 mg/dL — ABNORMAL LOW (ref 8.9–10.3)
Chloride: 102 mmol/L (ref 98–111)
Creatinine, Ser: 0.35 mg/dL — ABNORMAL LOW (ref 0.61–1.24)
GFR, Estimated: >60 mL/min (ref >60)
Glucose, Bld: 87 mg/dL (ref 70–99)
Potassium: 3.8 mmol/L (ref 3.5–5.1)
Sodium: 133 mmol/L — ABNORMAL LOW (ref 135–145)

## 2021-05-25 LAB — GRAM STAIN

## 2021-05-25 LAB — BODY FLUID CELL COUNT WITH DIFFERENTIAL
Lymphs, Fluid: 10 %
Monocyte-Macrophage-Serous Fluid: 85 % (ref 50–90)
Neutrophil Count, Fluid: 5 % (ref 0–25)
Total Nucleated Cell Count, Fluid: 438 cu mm (ref 0–1000)

## 2021-05-25 LAB — EXPECTORATED SPUTUM ASSESSMENT W GRAM STAIN, RFLX TO RESP C: Special Requests: NORMAL

## 2021-05-25 LAB — GLUCOSE, PLEURAL OR PERITONEAL FLUID: Glucose, Fluid: 122 mg/dL

## 2021-05-25 LAB — LACTATE DEHYDROGENASE, PLEURAL OR PERITONEAL FLUID: LD, Fluid: 107 U/L — ABNORMAL HIGH (ref 3–23)

## 2021-05-25 LAB — ALBUMIN, PLEURAL OR PERITONEAL FLUID: Albumin, Fluid: <1.5 g/dL

## 2021-05-25 MED ORDER — LIDOCAINE HCL 1 % IJ SOLN
INTRAMUSCULAR | Status: AC
  Administered 2021-05-25: 12:00:00 10 mL
  Filled 2021-05-25: qty 20

## 2021-05-25 MED ORDER — ORAL CARE MOUTH RINSE
15.0000 mL | Freq: Two times a day (BID) | OROMUCOSAL | Status: DC
  Administered 2021-05-27 – 2021-05-29 (×3): 15 mL via OROMUCOSAL

## 2021-05-25 MED ORDER — SORBITOL 70 % SOLN
300.0000 mL | TOPICAL_OIL | Freq: Once | ORAL | Status: AC
  Administered 2021-05-25: 300 mL via RECTAL
  Filled 2021-05-25: qty 90

## 2021-05-25 MED ORDER — TAMSULOSIN HCL 0.4 MG PO CAPS
0.4000 mg | ORAL_CAPSULE | Freq: Every day | ORAL | Status: DC
  Administered 2021-05-25 – 2021-06-04 (×11): 0.4 mg via ORAL
  Filled 2021-05-25 (×11): qty 1

## 2021-05-25 MED ORDER — OXYCODONE HCL 5 MG PO TABS
5.0000 mg | ORAL_TABLET | ORAL | Status: DC | PRN
  Administered 2021-05-25 – 2021-05-30 (×11): 5 mg via ORAL
  Filled 2021-05-25 (×11): qty 1

## 2021-05-25 MED ORDER — CHLORHEXIDINE GLUCONATE 0.12 % MT SOLN
15.0000 mL | Freq: Two times a day (BID) | OROMUCOSAL | Status: DC
  Administered 2021-05-25 – 2021-06-21 (×41): 15 mL via OROMUCOSAL
  Filled 2021-05-25 (×38): qty 15

## 2021-05-25 MED ORDER — SENNOSIDES-DOCUSATE SODIUM 8.6-50 MG PO TABS
2.0000 | ORAL_TABLET | Freq: Two times a day (BID) | ORAL | Status: DC
  Administered 2021-05-25 – 2021-06-04 (×21): 2 via ORAL
  Filled 2021-05-25 (×21): qty 2

## 2021-05-25 MED ORDER — CHLORHEXIDINE GLUCONATE CLOTH 2 % EX PADS
6.0000 | MEDICATED_PAD | Freq: Every day | CUTANEOUS | Status: DC
  Administered 2021-05-26 – 2021-06-21 (×25): 6 via TOPICAL

## 2021-05-25 MED ORDER — IPRATROPIUM-ALBUTEROL 0.5-2.5 (3) MG/3ML IN SOLN
3.0000 mL | Freq: Two times a day (BID) | RESPIRATORY_TRACT | Status: DC
  Administered 2021-05-25 – 2021-06-02 (×13): 3 mL via RESPIRATORY_TRACT
  Filled 2021-05-25 (×18): qty 3

## 2021-05-25 MED ORDER — HYDROMORPHONE HCL 1 MG/ML IJ SOLN
0.5000 mg | INTRAMUSCULAR | Status: DC | PRN
  Administered 2021-05-25 – 2021-05-30 (×15): 0.5 mg via INTRAVENOUS
  Filled 2021-05-25 (×16): qty 0.5

## 2021-05-25 NOTE — Progress Notes (Addendum)
PROGRESS NOTE  [name] [id number] DOB: 07/01/1960 DOA: 05/22/2021 PCP: Audley Hose, MD  HPI/Recap of past 24 hours:  [name] is a 60 y.o. male with medical history significant of chronic diastolic CHF, right foot amputee, iron deficiency anemia, anxiety, bipolar disorder, intellectual functional disability, obsessive-compulsive disorder, cerebral palsy, chronic bronchitis, constipation, environmental allergies, gastric ulcer with history of UGI bleed necessitating blood transfusion, GERD, hiatal hernia, history of varicella-zoster, hyperlipidemia, osteomyelitis of right foot, history of pneumonia, pressure ulcer of sacral area, history of Pseudomonas infection, recurrent UTIs, seborrheic dermatitis, vitamin D deficiency, functional quadriplegia who was brought from his group home due to productive cough for about a week.  Work up revealed community-acquired pneumonia with concern for possible aspiration, also revealed large B/L pleural effusions.  He was started on Rocephin and azithromycin empirically.  Post R thoracentesis by IR on 12/29 and post L thoracentesis by IR on 12/30, highly appreciated.  05/25/2021: Seen at his bedside.  Endorses sacral pain.  Pain management in place. Also reports constipation, bowel regimen in place.  Assessment/Plan: Principal Problem:   Acute dyspnea Active Problems:   Infantile cerebral palsy (HCC)   Pressure ulcer of foot   Functional quadriplegia (HCC)   Stage 3 skin ulcer of sacral region (HCC)   Microcytic anemia   Lactic acidosis   Iron deficiency anemia   Protein-calorie malnutrition, severe   Chronic diastolic CHF (congestive heart failure) (HCC)  Acute dyspnea secondary to community-acquired pneumonia with concern for possible aspiration and large B/L pleural effusions Speech therapist evaluated, recs reg consistency and thin fluids with aspiration precautions. Maintain oxygen saturation greater 92% Treat underlying  conditions Continue Rocephin and azithromycin, day 3/5 MRSA screening test positive, sputum culture in process. Patient responding well to Rocephin and azithromycin.  Leukocytosis has resolved.  T-max 99.5 Continue pulmonary toilet  Bilateral pleural effusions, s/p right thoracentesis by IR on 05/24/2021 Post R thoracentesis by IR on 12/29 and post L thoracentesis by IR on 12/30, highly appreciated. Maintain oxygen saturation > 92%  Sacral Pressure wounds Continue local wound care with wound care specialist guidance.  Chronic diastolic CHF Strict I's and O's and daily weight  Infantile cerebral palsy Functional quadriplegia Muscle relaxers as needed Analgesics as needed  Iron deficiency anemia Hemoglobin 10.6 from 11.8, 2 months ago Monitor H&H  Severe protein calorie malnutrition Continue protein supplementation  Chronic constipation Continue senokot 2 tabs bid, miralax bid Dulcolax sup every other day Smog enema 12/30  Acute urinary retention Insert Foley catheter after many recurrences on 05/25/2021 Start Flomax   Code Status: DNR  Family Communication: None at bedside  Disposition Plan: Likely return to group home.   Consultants: None  Procedures: None  Antimicrobials: Rocephin Azithromycin  DVT prophylaxis: Subcu Lovenox daily  Status is: Inpatient  Patient requires at least 2 midnights for further evaluation and treatment.      Objective: Vitals:   05/25/21 0959 05/25/21 1125 05/25/21 1140 05/25/21 1230  BP:  (!) 118/96 117/83 105/81  Pulse:    (!) 106  Resp:    20  Temp:    99.2 F (37.3 C)  TempSrc:    Oral  SpO2: 97%   95%    Intake/Output Summary (Last 24 hours) at 05/25/2021 1407 Last data filed at 05/25/2021 0810 Gross per 24 hour  Intake 360 ml  Output 550 ml  Net -190 ml   There were no vitals filed for this visit.  Exam:  General: 60 y.o. year-old  male frail-appearing.  He is alert oriented x3. Cardiovascular:  Regular rate and rhythm no rubs or gallops.   Respiratory: Mild rales at bases no wheezing noted.  Poor inspiratory effort. Abdomen: Soft nontender normal bowel sounds without  musculoskeletal: Trace lower extremity edema bilaterally.   Skin: Pressure wounds, POA. Psychiatry: Mood is appropriate for condition and setting.   Data Reviewed: CBC: Recent Labs  Lab 05/22/21 1348 05/24/21 0345  WBC 12.4* 10.1  NEUTROABS 8.8*  --   HGB 10.6* 10.3*  HCT 34.1* 32.7*  MCV 75.1* 74.5*  PLT 359 [id number]*   Basic Metabolic Panel: Recent Labs  Lab 05/22/21 1348 05/23/21 0345 05/24/21 0351 05/25/21 0420  NA 136 134* 135 133*  K 4.0 4.1 4.4 3.8  CL 105 106 102 102  CO2 22 22 23 24   GLUCOSE 116* 112* 98 87  BUN 18 15 21 18   CREATININE 0.41* 0.37* 0.47* 0.35*  CALCIUM 8.6* 8.6* 8.7* 8.4*   GFR: CrCl cannot be calculated (Unknown ideal weight.). Liver Function Tests: Recent Labs  Lab 05/22/21 1348  AST 29  ALT 23  ALKPHOS 132*  BILITOT 0.5  PROT 6.9  ALBUMIN 2.4*   No results for input(s): LIPASE, AMYLASE in the last 168 hours. No results for input(s): AMMONIA in the last 168 hours. Coagulation Profile: No results for input(s): INR, PROTIME in the last 168 hours. Cardiac Enzymes: No results for input(s): CKTOTAL, CKMB, CKMBINDEX, TROPONINI in the last 168 hours. BNP (last 3 results) No results for input(s): PROBNP in the last 8760 hours. HbA1C: No results for input(s): HGBA1C in the last 72 hours. CBG: Recent Labs  Lab 05/22/21 1957 05/23/21 0742  GLUCAP 93 100*   Lipid Profile: No results for input(s): CHOL, HDL, LDLCALC, TRIG, CHOLHDL, LDLDIRECT in the last 72 hours. Thyroid Function Tests: No results for input(s): TSH, T4TOTAL, FREET4, T3FREE, THYROIDAB in the last 72 hours. Anemia Panel: No results for input(s): VITAMINB12, FOLATE, FERRITIN, TIBC, IRON, RETICCTPCT in the last 72 hours. Urine analysis:    Component Value Date/Time   COLORURINE YELLOW (A)  02/20/2021 1432   APPEARANCEUR CLEAR (A) 02/20/2021 1432   LABSPEC >1.046 (H) 02/20/2021 1432   PHURINE 6.0 02/20/2021 1432   GLUCOSEU NEGATIVE 02/20/2021 1432   HGBUR NEGATIVE 02/20/2021 1432   HGBUR negative 06/14/2010 1119   BILIRUBINUR NEGATIVE 02/20/2021 1432   BILIRUBINUR SMALL 01/04/2016 1634   KETONESUR NEGATIVE 02/20/2021 1432   PROTEINUR 100 (A) 02/20/2021 1432   UROBILINOGEN 2.0 (H) 02/05/2018 1825   NITRITE NEGATIVE 02/20/2021 1432   LEUKOCYTESUR NEGATIVE 02/20/2021 1432   Sepsis Labs: @LABRCNTIP (procalcitonin:4,lacticidven:4)  ) Recent Results (from the past 240 hour(s))  Resp Panel by RT-PCR (Flu A&B, Covid) Nasopharyngeal Swab     Status: None   Collection Time: 05/22/21  1:48 PM   Specimen: Nasopharyngeal Swab; Nasopharyngeal(NP) swabs in vial transport medium  Result Value Ref Range Status   SARS Coronavirus 2 by RT PCR NEGATIVE NEGATIVE Final    Comment: (NOTE) SARS-CoV-2 target nucleic acids are NOT DETECTED.  The SARS-CoV-2 RNA is generally detectable in upper respiratory specimens during the acute phase of infection. The lowest concentration of SARS-CoV-2 viral copies this assay can detect is 138 copies/mL. A negative result does not preclude SARS-Cov-2 infection and should not be used as the sole basis for treatment or other patient management decisions. A negative result may occur with  improper specimen collection/handling, submission of specimen other than nasopharyngeal swab, presence of viral mutation(s) within the areas targeted by  this assay, and inadequate number of viral copies(<138 copies/mL). A negative result must be combined with clinical observations, patient history, and epidemiological information. The expected result is Negative.  Fact Sheet for Patients:  EntrepreneurPulse.com.au  Fact Sheet for Healthcare Providers:  IncredibleEmployment.be  This test is no t yet approved or cleared by the  [place] FDA and  has been authorized for detection and/or diagnosis of SARS-CoV-2 by FDA under an Emergency Use Authorization (EUA). This EUA will remain  in effect (meaning this test can be used) for the duration of the COVID-19 declaration under Section 564(b)(1) of the Act, 21 U.S.C.section 360bbb-3(b)(1), unless the authorization is terminated  or revoked sooner.       Influenza A by PCR NEGATIVE NEGATIVE Final   Influenza B by PCR NEGATIVE NEGATIVE Final    Comment: (NOTE) The Xpert Xpress SARS-CoV-2/FLU/RSV plus assay is intended as an aid in the diagnosis of influenza from Nasopharyngeal swab specimens and should not be used as a sole basis for treatment. Nasal washings and aspirates are unacceptable for Xpert Xpress SARS-CoV-2/FLU/RSV testing.  Fact Sheet for Patients: EntrepreneurPulse.com.au  Fact Sheet for Healthcare Providers: IncredibleEmployment.be  This test is not yet approved or cleared by the [place] FDA and has been authorized for detection and/or diagnosis of SARS-CoV-2 by FDA under an Emergency Use Authorization (EUA). This EUA will remain in effect (meaning this test can be used) for the duration of the COVID-19 declaration under Section 564(b)(1) of the Act, 21 U.S.C. section 360bbb-3(b)(1), unless the authorization is terminated or revoked.  Performed at [facility], [place] [address] [place], [place] [address]   MRSA Next Gen by PCR, Nasal     Status: Abnormal   Collection Time: 05/23/21 11:12 AM   Specimen: Nasal Mucosa; Nasal Swab  Result Value Ref Range Status   MRSA by PCR Next Gen DETECTED (A) NOT DETECTED Final    Comment: RESULT CALLED TO, READ BACK BY AND VERIFIED WITH: MELTON,A. RN AT [id number] 05/23/21 MULLINS,T (NOTE) The GeneXpert MRSA Assay (FDA approved for NASAL specimens only), is one component of a comprehensive MRSA colonization surveillance program. It is not intended  to diagnose MRSA infection nor to guide or monitor treatment for MRSA infections. Test performance is not FDA approved in patients less than 87 years old. Performed at [facility], [place] [address], [place], [place] [address]   Body fluid culture w Gram Stain     Status: None (Preliminary result)   Collection Time: 05/24/21 11:18 AM   Specimen: PATH Cytology Pleural fluid  Result Value Ref Range Status   Specimen Description   Final    PLEURAL Performed at [place] [address], [place], [place] [address]    Special Requests   Final    NONE Performed at [facility], [place] [address] [place], [place] [address]    Gram Stain   Final    RARE WBC PRESENT, PREDOMINANTLY MONONUCLEAR NO ORGANISMS SEEN    Culture   Final    NO GROWTH < 24 HOURS Performed at [facility], [place] [address], [place], [place] [address]    Report Status PENDING  Incomplete  Expectorated Sputum Assessment w Gram Stain, Rflx to Resp Cult     Status: None   Collection Time: 05/25/21  9:06 AM   Specimen: Expectorated Sputum  Result Value Ref Range Status   Specimen Description EXPECTORATED SPUTUM  Final   Special Requests Normal  Final   Sputum evaluation   Final    THIS SPECIMEN IS ACCEPTABLE FOR SPUTUM CULTURE Performed at [facility], [address], [place], [place] [address]    Report Status 05/25/2021 FINAL  Final      Studies: DG Chest Port 1 View  Result Date: 05/25/2021 CLINICAL DATA:  Pleural effusion status post thoracentesis EXAM: PORTABLE CHEST 1 VIEW COMPARISON:  Chest radiograph 1 day prior FINDINGS: The cardiomediastinal silhouette is grossly stable, allowing for significant rightward patient rotation. The left pleural effusion has markedly reduced in size following thoracentesis with significantly improved aeration of the left lung. Opacity overlying the right hemithorax is favored to be external to the  patient. There is no significant right effusion. There is no pneumothorax. IMPRESSION: Markedly reduced left pleural effusion with improved aeration of the left lung following thoracentesis. No pneumothorax. Electronically Signed   By: Lesia Hausen M.D.   On: 05/25/2021 12:02    Scheduled Meds:  vitamin C  250 mg Oral Daily   azithromycin  500 mg Oral Daily   baclofen  20 mg Oral TID   bisacodyl  10 mg Rectal QODAY   calcium carbonate  3 tablet Oral Q breakfast   chlorhexidine  15 mL Mouth Rinse BID   cholecalciferol  5,000 Units Oral Daily   clotrimazole  1 application Topical BID   diclofenac Sodium  4 g Topical QID   divalproex  500 mg Oral BID   enoxaparin (LOVENOX) injection  40 mg Subcutaneous Q24H   feeding supplement  237 mL Oral BID BM   fesoterodine  4 mg Oral Daily   fluticasone  1 spray Each Nare Daily   hydrocortisone cream  1 application Topical BID   ipratropium-albuterol  3 mL Nebulization BID   iron polysaccharides  150 mg Oral Q1200   ketoconazole  1 application Topical Once per day on Mon Thu   lactulose  15 g Oral Daily   lisinopril  2.5 mg Oral Daily   loratadine  10 mg Oral Daily   mouth rinse  15 mL Mouth Rinse q12n4p   pantoprazole  40 mg Oral Daily   polyethylene glycol  17 g Oral BID   risperiDONE  2 mg Oral QHS   senna-docusate  1 tablet Oral QHS   sorbitol, milk of mag, mineral oil, glycerin (SMOG) enema  300 mL Rectal Once    Continuous Infusions:  cefTRIAXone (ROCEPHIN)  IV 1 g (05/24/21 1552)     LOS: 2 days     Darlin Drop, MD [facility] Pager [phone]  If 7PM-7AM, please contact night-coverage [email or web address] Password [email or web address] 05/25/2021, 2:07 PM

## 2021-05-25 NOTE — Progress Notes (Signed)
SMOG enema performed per MD order.  Patient had medium brown bowel movement.  Bladder scan revealed 540 mL urine.  Indwelling urethral catheter placed per MD order, 375 mL clear amber urine returned.  Patient tolerated well.  Bradd Burner, RN

## 2021-05-25 NOTE — Consult Note (Signed)
WOC consult requested for sacrum wound.  This was already performed on 12/28; refer to previus consult notes for assessment and measurements, and topical treatment orders have been provided for bedside nurses to perform. Please re-consult if further assistance is needed.  Thank-you,  Cammie Mcgee MSN, RN, CWOCN, [place], CNS [phone]

## 2021-05-25 NOTE — Procedures (Signed)
PROCEDURE SUMMARY:  Successful image-guided left thoracentesis. Yielded 1.3L of clear yellow fluid. Pt tolerated procedure well. No immediate complications. EBL = trace   Specimen was sent for labs. CXR ordered.  Please see imaging section of Epic for full dictation.  Lynann Bologna Sherron Mapp PA-C 05/25/2021 11:53 AM

## 2021-05-26 DIAGNOSIS — R0600 Dyspnea, unspecified: Secondary | ICD-10-CM | POA: Diagnosis not present

## 2021-05-26 LAB — BASIC METABOLIC PANEL
Anion gap: 5 (ref 5–15)
BUN: 18 mg/dL (ref 8–23)
CO2: 26 mmol/L (ref 22–32)
Calcium: 8.6 mg/dL — ABNORMAL LOW (ref 8.9–10.3)
Chloride: 101 mmol/L (ref 98–111)
Creatinine, Ser: 0.32 mg/dL — ABNORMAL LOW (ref 0.61–1.24)
GFR, Estimated: >60 mL/min (ref >60)
Glucose, Bld: 88 mg/dL (ref 70–99)
Potassium: 4 mmol/L (ref 3.5–5.1)
Sodium: 132 mmol/L — ABNORMAL LOW (ref 135–145)

## 2021-05-26 LAB — CBC
HCT: 27.6 % — ABNORMAL LOW (ref 39.0–52.0)
Hemoglobin: 8.9 g/dL — ABNORMAL LOW (ref 13.0–17.0)
MCH: 23.7 pg — ABNORMAL LOW (ref 26.0–34.0)
MCHC: 32.2 g/dL (ref 30.0–36.0)
MCV: 73.4 fL — ABNORMAL LOW (ref 80.0–100.0)
Platelets: 564 10*3/uL — ABNORMAL HIGH (ref 150–400)
RBC: 3.76 MIL/uL — ABNORMAL LOW (ref 4.22–5.81)
RDW: 18.4 % — ABNORMAL HIGH (ref 11.5–15.5)
WBC: 9.2 10*3/uL (ref 4.0–10.5)
nRBC: 0.2 % (ref 0.0–0.2)

## 2021-05-26 NOTE — Progress Notes (Signed)
PROGRESS NOTE  [name] [id number] DOB: 04/21/1961 DOA: 05/22/2021 PCP: Audley Hose, MD  HPI/Recap of past 24 hours:  [name] is a 60 y.o. male with medical history significant of chronic diastolic CHF, right foot amputee, iron deficiency anemia, anxiety, bipolar disorder, intellectual functional disability, obsessive-compulsive disorder, cerebral palsy, chronic bronchitis, constipation, environmental allergies, gastric ulcer with history of UGI bleed necessitating blood transfusion, GERD, hiatal hernia, history of varicella-zoster, hyperlipidemia, osteomyelitis of right foot, history of pneumonia, pressure ulcer of sacral area, history of Pseudomonas infection, recurrent UTIs, seborrheic dermatitis, vitamin D deficiency, functional quadriplegia who was brought from his group home due to productive cough for about a week.  Work up revealed community-acquired pneumonia with concern for possible aspiration, also revealed large B/L pleural effusions.  He was started on Rocephin and azithromycin empirically.  Post R thoracentesis by IR on 12/29 and post L thoracentesis by IR on 12/30, highly appreciated.  05/26/2021: Seen and examined at his bedside.  Mainly complains of sacral pain.  Breathing is improved and no longer requiring oxygen supplementation.  Assessment/Plan: Principal Problem:   Acute dyspnea Active Problems:   Infantile cerebral palsy (HCC)   Pressure ulcer of foot   Functional quadriplegia (HCC)   Stage 3 skin ulcer of sacral region (HCC)   Microcytic anemia   Lactic acidosis   Iron deficiency anemia   Protein-calorie malnutrition, severe   Chronic diastolic CHF (congestive heart failure) (HCC)  Resolved acute dyspnea secondary to community-acquired pneumonia with concern for possible aspiration and large B/L pleural effusions, post bilateral thoracentesis by IR. Speech therapist evaluated, recs reg consistency and thin fluids with aspiration  precautions. Maintain oxygen saturation greater 92% No longer requiring oxygen supplementation post bilateral thoracentesis. Treat underlying conditions Completed 5 days of azithromycin Continue Rocephin, to complete 5 days. Sputum culture reincubated for better growth. MRSA screening test positive  Bilateral pleural effusions, s/p bilateral thoracentesis by IR. Personally reviewed chest x-ray showing improvement of pleural effusions. O2 saturation 93% on room air.  Sacral Pressure wounds Continue local wound care with wound care specialist guidance. Continue pain control  Chronic diastolic CHF Strict I's and O's and daily weight  Infantile cerebral palsy Functional quadriplegia Muscle relaxers as needed Analgesics as needed  Iron deficiency anemia Hemoglobin 10.6 from 11.8, 2 months ago Monitor H&H  Severe protein calorie malnutrition Continue protein supplementation  Chronic constipation Continue senokot 2 tabs bid, miralax bid Dulcolax sup every other day Smog enema 12/30 Continue bowel regimen  Acute urinary retention Insert Foley catheter after many recurrences on 05/25/2021 Continue Flomax   Code Status: DNR  Family Communication: None at bedside  Disposition Plan: Likely return to group home.   Consultants: None  Procedures: None  Antimicrobials: Rocephin Azithromycin  DVT prophylaxis: Subcu Lovenox daily  Status is: Inpatient  Patient requires at least 2 midnights for further evaluation and treatment.      Objective: Vitals:   05/26/21 0300 05/26/21 0544 05/26/21 0922 05/26/21 1500  BP:  122/75  120/80  Pulse:  91  (!) 110  Resp: 13   16  Temp:  98.1 F (36.7 C)  98.5 F (36.9 C)  TempSrc:  Oral  Axillary  SpO2:  98% 95% 93%  Weight:  59.8 kg      Intake/Output Summary (Last 24 hours) at 05/26/2021 1649 Last data filed at 05/26/2021 0912 Gross per 24 hour  Intake 460 ml  Output 350 ml  Net 110 ml   [employer]  05/26/21 0544  Weight: 59.8 kg    Exam:  General: 60 y.o. year-old male frail-appearing no acute distress.  He is alert and oriented x3.   Cardiovascular: Regular rate and rhythm no rubs or gallops.   Respiratory: Mild rales at bases no wheezing noted.   Abdomen: Soft monitor normal bowel sounds present.   Musculoskeletal: Trace lower extremity edema bilaterally.   Skin: Pressure wounds present. Psychiatry: Mood is appropriate for condition and setting.   Data Reviewed: CBC: Recent Labs  Lab 05/22/21 1348 05/24/21 0345 05/26/21 0737  WBC 12.4* 10.1 9.2  NEUTROABS 8.8*  --   --   HGB 10.6* 10.3* 8.9*  HCT 34.1* 32.7* 27.6*  MCV 75.1* 74.5* 73.4*  PLT 359 493* [id number]*   Basic Metabolic Panel: Recent Labs  Lab 05/22/21 1348 05/23/21 0345 05/24/21 0351 05/25/21 0420 05/26/21 0437  NA 136 134* 135 133* 132*  K 4.0 4.1 4.4 3.8 4.0  CL 105 106 102 102 101  CO2 22 22 23 24 26   GLUCOSE 116* 112* 98 87 88  BUN 18 15 21 18 18   CREATININE 0.41* 0.37* 0.47* 0.35* 0.32*  CALCIUM 8.6* 8.6* 8.7* 8.4* 8.6*   GFR: Estimated Creatinine Clearance: 82 mL/min (A) (by C-G formula based on SCr of 0.32 mg/dL (L)). Liver Function Tests: Recent Labs  Lab 05/22/21 1348  AST 29  ALT 23  ALKPHOS 132*  BILITOT 0.5  PROT 6.9  ALBUMIN 2.4*   No results for input(s): LIPASE, AMYLASE in the last 168 hours. No results for input(s): AMMONIA in the last 168 hours. Coagulation Profile: No results for input(s): INR, PROTIME in the last 168 hours. Cardiac Enzymes: No results for input(s): CKTOTAL, CKMB, CKMBINDEX, TROPONINI in the last 168 hours. BNP (last 3 results) No results for input(s): PROBNP in the last 8760 hours. HbA1C: No results for input(s): HGBA1C in the last 72 hours. CBG: Recent Labs  Lab 05/22/21 1957 05/23/21 0742  GLUCAP 93 100*   Lipid Profile: No results for input(s): CHOL, HDL, LDLCALC, TRIG, CHOLHDL, LDLDIRECT in the last 72 hours. Thyroid Function Tests: No  results for input(s): TSH, T4TOTAL, FREET4, T3FREE, THYROIDAB in the last 72 hours. Anemia Panel: No results for input(s): VITAMINB12, FOLATE, FERRITIN, TIBC, IRON, RETICCTPCT in the last 72 hours. Urine analysis:    Component Value Date/Time   COLORURINE YELLOW (A) 02/20/2021 1432   APPEARANCEUR CLEAR (A) 02/20/2021 1432   LABSPEC >1.046 (H) 02/20/2021 1432   PHURINE 6.0 02/20/2021 1432   GLUCOSEU NEGATIVE 02/20/2021 1432   HGBUR NEGATIVE 02/20/2021 1432   HGBUR negative 06/14/2010 1119   BILIRUBINUR NEGATIVE 02/20/2021 1432   BILIRUBINUR SMALL 01/04/2016 1634   KETONESUR NEGATIVE 02/20/2021 1432   PROTEINUR 100 (A) 02/20/2021 1432   UROBILINOGEN 2.0 (H) 02/05/2018 1825   NITRITE NEGATIVE 02/20/2021 1432   LEUKOCYTESUR NEGATIVE 02/20/2021 1432   Sepsis Labs: @LABRCNTIP (procalcitonin:4,lacticidven:4)  ) Recent Results (from the past 240 hour(s))  Resp Panel by RT-PCR (Flu A&B, Covid) Nasopharyngeal Swab     Status: None   Collection Time: 05/22/21  1:48 PM   Specimen: Nasopharyngeal Swab; Nasopharyngeal(NP) swabs in vial transport medium  Result Value Ref Range Status   SARS Coronavirus 2 by RT PCR NEGATIVE NEGATIVE Final    Comment: (NOTE) SARS-CoV-2 target nucleic acids are NOT DETECTED.  The SARS-CoV-2 RNA is generally detectable in upper respiratory specimens during the acute phase of infection. The lowest concentration of SARS-CoV-2 viral copies this assay can detect is 138 copies/mL. A negative  result does not preclude SARS-Cov-2 infection and should not be used as the sole basis for treatment or other patient management decisions. A negative result may occur with  improper specimen collection/handling, submission of specimen other than nasopharyngeal swab, presence of viral mutation(s) within the areas targeted by this assay, and inadequate number of viral copies(<138 copies/mL). A negative result must be combined with clinical observations, patient history, and  epidemiological information. The expected result is Negative.  Fact Sheet for Patients:  BloggerCourse.com  Fact Sheet for Healthcare Providers:  SeriousBroker.it  This test is no t yet approved or cleared by the [place] FDA and  has been authorized for detection and/or diagnosis of SARS-CoV-2 by FDA under an Emergency Use Authorization (EUA). This EUA will remain  in effect (meaning this test can be used) for the duration of the COVID-19 declaration under Section 564(b)(1) of the Act, 21 U.S.C.section 360bbb-3(b)(1), unless the authorization is terminated  or revoked sooner.       Influenza A by PCR NEGATIVE NEGATIVE Final   Influenza B by PCR NEGATIVE NEGATIVE Final    Comment: (NOTE) The Xpert Xpress SARS-CoV-2/FLU/RSV plus assay is intended as an aid in the diagnosis of influenza from Nasopharyngeal swab specimens and should not be used as a sole basis for treatment. Nasal washings and aspirates are unacceptable for Xpert Xpress SARS-CoV-2/FLU/RSV testing.  Fact Sheet for Patients: BloggerCourse.com  Fact Sheet for Healthcare Providers: SeriousBroker.it  This test is not yet approved or cleared by the [place] FDA and has been authorized for detection and/or diagnosis of SARS-CoV-2 by FDA under an Emergency Use Authorization (EUA). This EUA will remain in effect (meaning this test can be used) for the duration of the COVID-19 declaration under Section 564(b)(1) of the Act, 21 U.S.C. section 360bbb-3(b)(1), unless the authorization is terminated or revoked.  Performed at [facility], [address] [place], [place] [address]   MRSA Next Gen by PCR, Nasal     Status: Abnormal   Collection Time: 05/23/21 11:12 AM   Specimen: Nasal Mucosa; Nasal Swab  Result Value Ref Range Status   MRSA by PCR Next Gen DETECTED (A) NOT DETECTED Final     Comment: RESULT CALLED TO, READ BACK BY AND VERIFIED WITH: MELTON,A. RN AT 1251 05/23/21 MULLINS,T (NOTE) The GeneXpert MRSA Assay (FDA approved for NASAL specimens only), is one component of a comprehensive MRSA colonization surveillance program. It is not intended to diagnose MRSA infection nor to guide or monitor treatment for MRSA infections. Test performance is not FDA approved in patients less than 86 years old. Performed at [facility], [address], [place], [place] [address]   Body fluid culture w Gram Stain     Status: None (Preliminary result)   Collection Time: 05/24/21 11:18 AM   Specimen: PATH Cytology Pleural fluid  Result Value Ref Range Status   Specimen Description   Final    PLEURAL Performed at [facility], [address] [place], [place] [address]    Special Requests   Final    NONE Performed at [facility], [address], [place], [place] [address]    Gram Stain   Final    RARE WBC PRESENT, PREDOMINANTLY MONONUCLEAR NO ORGANISMS SEEN    Culture   Final    NO GROWTH 2 DAYS Performed at [facility], [address] [place], [place] [address]    Report Status PENDING  Incomplete  Expectorated Sputum  Assessment w Gram Stain, Rflx to Resp Cult     Status: None   Collection Time: 05/25/21  9:06 AM   Specimen: Expectorated Sputum  Result Value Ref Range Status   Specimen Description EXPECTORATED SPUTUM  Final   Special Requests Normal  Final   Sputum evaluation   Final    THIS SPECIMEN IS ACCEPTABLE FOR SPUTUM CULTURE Performed at [facility], [place] [address] [place], [place] [address]    Report Status 05/25/2021 FINAL  Final  Culture, Respiratory w Gram Stain     Status: None (Preliminary result)   Collection Time: 05/25/21  9:06 AM  Result Value Ref Range Status   Specimen Description   Final    EXPECTORATED SPUTUM Performed at [place] [address] [place], [place] [address]    Special Requests   Final    Normal Reflexed from [phone] Performed at [facility], [place] [address], [place], [place] [address]    Gram Stain   Final    RARE WBC PRESENT, PREDOMINANTLY PMN FEW GRAM POSITIVE COCCI IN PAIRS IN CLUSTERS RARE GRAM NEGATIVE RODS    Culture   Final    CULTURE REINCUBATED FOR BETTER GROWTH Performed at [facility], [place] [address], [place], [place] [address]    Report Status PENDING  Incomplete  Culture, body fluid w Gram Stain-bottle     Status: None (Preliminary result)   Collection Time: 05/25/21 12:00 PM   Specimen: Pleura  Result Value Ref Range Status   Specimen Description PLEURAL FLUID LEFT  Final   Special Requests NONE  Final   Culture   Final    NO GROWTH < 24 HOURS Performed at [facility], [place] [address] [place], [place] [address]    Report Status PENDING  Incomplete  Gram stain     Status: None   Collection Time: 05/25/21 12:00 PM   Specimen: Pleura  Result Value Ref Range Status   Specimen Description PLEURAL FLUID LEFT  Final   Special Requests NONE  Final   Gram Stain   Final    WBC PRESENT,BOTH PMN AND MONONUCLEAR NO ORGANISMS SEEN CYTOSPIN SMEAR Performed at [facility], [address] [place],  [address]    Report Status 05/25/2021 FINAL  Final      Studies: No results found.  Scheduled Meds:  vitamin C  250 mg Oral Daily   baclofen  20 mg Oral TID   bisacodyl  10 mg Rectal QODAY   calcium carbonate  3 tablet Oral Q breakfast   chlorhexidine  15 mL Mouth Rinse BID   Chlorhexidine Gluconate Cloth  6 each Topical Daily   cholecalciferol  5,000 Units Oral Daily   clotrimazole  1 application Topical BID   diclofenac Sodium  4 g Topical QID   divalproex  500 mg Oral BID   enoxaparin (LOVENOX) injection  40 mg Subcutaneous Q24H   feeding supplement  237 mL Oral BID BM   fesoterodine  4 mg Oral Daily   fluticasone  1 spray  Each Nare Daily   hydrocortisone cream  1 application Topical BID   ipratropium-albuterol  3 mL Nebulization BID   iron polysaccharides  150 mg Oral Q1200   ketoconazole  1 application Topical Once per day on Mon Thu   lactulose  15 g Oral Daily   lisinopril  2.5 mg Oral Daily   loratadine  10 mg Oral Daily  mouth rinse  15 mL Mouth Rinse q12n4p   pantoprazole  40 mg Oral Daily   polyethylene glycol  17 g Oral BID   risperiDONE  2 mg Oral QHS   senna-docusate  2 tablet Oral BID   tamsulosin  0.4 mg Oral QPC supper    Continuous Infusions:  cefTRIAXone (ROCEPHIN)  IV 1 g (05/25/21 1530)     LOS: 3 days     Kayleen Memos, MD [facility] Pager [phone]  If 7PM-7AM, please contact night-coverage [email or web address] Password [facility] 05/26/2021, 4:49 PM

## 2021-05-27 DIAGNOSIS — R0600 Dyspnea, unspecified: Secondary | ICD-10-CM | POA: Diagnosis not present

## 2021-05-27 LAB — BASIC METABOLIC PANEL
Anion gap: 6 (ref 5–15)
BUN: 20 mg/dL (ref 8–23)
CO2: 25 mmol/L (ref 22–32)
Calcium: 8.6 mg/dL — ABNORMAL LOW (ref 8.9–10.3)
Chloride: 103 mmol/L (ref 98–111)
Creatinine, Ser: <0.3 mg/dL — ABNORMAL LOW (ref 0.61–1.24)
Glucose, Bld: 93 mg/dL (ref 70–99)
Potassium: 4 mmol/L (ref 3.5–5.1)
Sodium: 134 mmol/L — ABNORMAL LOW (ref 135–145)

## 2021-05-27 LAB — CBC
HCT: 31.3 % — ABNORMAL LOW (ref 39.0–52.0)
Hemoglobin: 9.8 g/dL — ABNORMAL LOW (ref 13.0–17.0)
MCH: 23.2 pg — ABNORMAL LOW (ref 26.0–34.0)
MCHC: 31.3 g/dL (ref 30.0–36.0)
MCV: 74.2 fL — ABNORMAL LOW (ref 80.0–100.0)
Platelets: 713 10*3/uL — ABNORMAL HIGH (ref 150–400)
RBC: 4.22 MIL/uL (ref 4.22–5.81)
RDW: 18.9 % — ABNORMAL HIGH (ref 11.5–15.5)
WBC: 10.8 10*3/uL — ABNORMAL HIGH (ref 4.0–10.5)
nRBC: 0 % (ref 0.0–0.2)

## 2021-05-27 LAB — CULTURE, RESPIRATORY W GRAM STAIN
Culture: NORMAL
Special Requests: NORMAL

## 2021-05-27 LAB — PROCALCITONIN: Procalcitonin: <0.1 ng/mL

## 2021-05-27 NOTE — Progress Notes (Signed)
PROGRESS NOTE  [name] [id number] DOB: 01/09/1961 DOA: 05/22/2021 PCP: Audley Hose, MD  HPI/Recap of past 24 hours:  [name] is a 61 y.o. male with medical history significant of chronic diastolic CHF, right foot amputee, iron deficiency anemia, anxiety, bipolar disorder, intellectual functional disability, obsessive-compulsive disorder, cerebral palsy, chronic bronchitis, constipation, environmental allergies, gastric ulcer with history of UGI bleed necessitating blood transfusion, GERD, hiatal hernia, history of varicella-zoster, hyperlipidemia, osteomyelitis of right foot, history of pneumonia, pressure ulcer of sacral area, history of Pseudomonas infection, recurrent UTIs, seborrheic dermatitis, vitamin D deficiency, functional quadriplegia who was brought from his group home due to productive cough for about a week.  Work up revealed community-acquired pneumonia with concern for possible aspiration, also revealed large B/L pleural effusions.  He was started on Rocephin and azithromycin empirically.  Post R thoracentesis by IR on 12/29 and post L thoracentesis by IR on 12/30, highly appreciated.  05/27/2021: Seen at his bedside.  Cough is improved.  Sacral pain is better.   Assessment/Plan: Principal Problem:   Acute dyspnea Active Problems:   Infantile cerebral palsy (HCC)   Pressure ulcer of foot   Functional quadriplegia (HCC)   Stage 3 skin ulcer of sacral region (HCC)   Microcytic anemia   Lactic acidosis   Iron deficiency anemia   Protein-calorie malnutrition, severe   Chronic diastolic CHF (congestive heart failure) (HCC)  Resolved acute dyspnea secondary to community-acquired pneumonia with concern for possible aspiration and large B/L pleural effusions, post bilateral thoracentesis by IR. Speech therapist evaluated, recs reg consistency and thin fluids with aspiration precautions. Maintain oxygen saturation greater 92% No longer requiring oxygen  supplementation post bilateral thoracentesis. Treat underlying conditions Completed 5 days of azithromycin Continue Rocephin, to complete 5 days. Sputum culture reincubated for better growth. MRSA screening test positive  Bilateral pleural effusions, s/p bilateral thoracentesis by IR. Personally reviewed chest x-ray showing improvement of pleural effusions. O2 saturation 95% on room air.  Sacral Pressure wounds Continue local wound care with wound care specialist guidance. Continue pain control  Chronic diastolic CHF Strict I's and O's and daily weight  Infantile cerebral palsy Functional quadriplegia Muscle relaxers as needed Analgesics as needed  Iron deficiency anemia Hemoglobin 10.6 from 11.8, 2 months ago Monitor H&H  Severe protein calorie malnutrition Continue protein supplementation  Chronic constipation Continue senokot 2 tabs bid, miralax bid Dulcolax sup every other day Smog enema 12/30 Continue bowel regimen  Acute urinary retention Insert Foley catheter after many recurrences on 05/25/2021 Continue Flomax   Code Status: DNR  Family Communication: None at bedside  Disposition Plan: Likely return to group home.   Consultants: None  Procedures: None  Antimicrobials: Rocephin Azithromycin  DVT prophylaxis: Subcu Lovenox daily  Status is: Inpatient  Patient requires at least 2 midnights for further evaluation and treatment.      Objective: Vitals:   05/27/21 0816 05/27/21 1018 05/27/21 1046 05/27/21 1255  BP:  (!) 83/56 92/63 99/75   Pulse:  (!) 106 (!) 104 (!) 101  Resp:  16 16 16   Temp:  98.5 F (36.9 C) 98.9 F (37.2 C) 98.6 F (37 C)  TempSrc:  Oral Axillary Axillary  SpO2: 93% 92% 92% 95%  Weight:        Intake/Output Summary (Last 24 hours) at 05/27/2021 1355 Last data filed at 05/27/2021 1300 Gross per 24 hour  Intake 840 ml  Output 750 ml  Net 90 ml   Filed Weights   05/26/21 0544  Weight: 59.8 kg    Exam:  No  significant changes from prior exam.  General: 61 y.o. year-old male frail-appearing no acute distress.  He is alert and oriented x3.   Cardiovascular: Regular rate and rhythm no rubs or gallops.   Respiratory: Mild rales at bases no wheezing noted.   Abdomen: Soft monitor normal bowel sounds present.   Musculoskeletal: Trace lower extremity edema bilaterally.   Skin: Pressure wounds present. Psychiatry: Mood is appropriate for condition and setting.   Data Reviewed: CBC: Recent Labs  Lab 05/22/21 1348 05/24/21 0345 05/26/21 0737 05/27/21 0704  WBC 12.4* 10.1 9.2 10.8*  NEUTROABS 8.8*  --   --   --   HGB 10.6* 10.3* 8.9* 9.8*  HCT 34.1* 32.7* 27.6* 31.3*  MCV 75.1* 74.5* 73.4* 74.2*  PLT 359 493* 564* 123XX123*   Basic Metabolic Panel: Recent Labs  Lab 05/23/21 0345 05/24/21 0351 05/25/21 0420 05/26/21 0437 05/27/21 0413  NA 134* 135 133* 132* 134*  K 4.1 4.4 3.8 4.0 4.0  CL 106 102 102 101 103  CO2 22 23 24 26 25   GLUCOSE 112* 98 87 88 93  BUN 15 21 18 18 20   CREATININE 0.37* 0.47* 0.35* 0.32* <0.30*  CALCIUM 8.6* 8.7* 8.4* 8.6* 8.6*   GFR: CrCl cannot be calculated (This lab value cannot be used to calculate CrCl because it is not a number: <0.30). Liver Function Tests: Recent Labs  Lab 05/22/21 1348  AST 29  ALT 23  ALKPHOS 132*  BILITOT 0.5  PROT 6.9  ALBUMIN 2.4*   No results for input(s): LIPASE, AMYLASE in the last 168 hours. No results for input(s): AMMONIA in the last 168 hours. Coagulation Profile: No results for input(s): INR, PROTIME in the last 168 hours. Cardiac Enzymes: No results for input(s): CKTOTAL, CKMB, CKMBINDEX, TROPONINI in the last 168 hours. BNP (last 3 results) No results for input(s): PROBNP in the last 8760 hours. HbA1C: No results for input(s): HGBA1C in the last 72 hours. CBG: Recent Labs  Lab 05/22/21 1957 05/23/21 0742  GLUCAP 93 100*   Lipid Profile: No results for input(s): CHOL, HDL, LDLCALC, TRIG, CHOLHDL,  LDLDIRECT in the last 72 hours. Thyroid Function Tests: No results for input(s): TSH, T4TOTAL, FREET4, T3FREE, THYROIDAB in the last 72 hours. Anemia Panel: No results for input(s): VITAMINB12, FOLATE, FERRITIN, TIBC, IRON, RETICCTPCT in the last 72 hours. Urine analysis:    Component Value Date/Time   COLORURINE YELLOW (A) 02/20/2021 1432   APPEARANCEUR CLEAR (A) 02/20/2021 1432   LABSPEC >1.046 (H) 02/20/2021 1432   PHURINE 6.0 02/20/2021 1432   GLUCOSEU NEGATIVE 02/20/2021 1432   HGBUR NEGATIVE 02/20/2021 1432   HGBUR negative 06/14/2010 1119   BILIRUBINUR NEGATIVE 02/20/2021 1432   BILIRUBINUR SMALL 01/04/2016 1634   KETONESUR NEGATIVE 02/20/2021 1432   PROTEINUR 100 (A) 02/20/2021 1432   UROBILINOGEN 2.0 (H) 02/05/2018 1825   NITRITE NEGATIVE 02/20/2021 1432   LEUKOCYTESUR NEGATIVE 02/20/2021 1432   Sepsis Labs: @LABRCNTIP (procalcitonin:4,lacticidven:4)  ) Recent Results (from the past 240 hour(s))  Resp Panel by RT-PCR (Flu A&B, Covid) Nasopharyngeal Swab     Status: None   Collection Time: 05/22/21  1:48 PM   Specimen: Nasopharyngeal Swab; Nasopharyngeal(NP) swabs in vial transport medium  Result Value Ref Range Status   SARS Coronavirus 2 by RT PCR NEGATIVE NEGATIVE Final    Comment: (NOTE) SARS-CoV-2 target nucleic acids are NOT DETECTED.  The SARS-CoV-2 RNA is generally detectable in upper respiratory specimens during the acute  phase of infection. The lowest concentration of SARS-CoV-2 viral copies this assay can detect is 138 copies/mL. A negative result does not preclude SARS-Cov-2 infection and should not be used as the sole basis for treatment or other patient management decisions. A negative result may occur with  improper specimen collection/handling, submission of specimen other than nasopharyngeal swab, presence of viral mutation(s) within the areas targeted by this assay, and inadequate number of viral copies(<138 copies/mL). A negative result must be  combined with clinical observations, patient history, and epidemiological information. The expected result is Negative.  Fact Sheet for Patients:  EntrepreneurPulse.com.au  Fact Sheet for Healthcare Providers:  IncredibleEmployment.be  This test is no t yet approved or cleared by the [place] FDA and  has been authorized for detection and/or diagnosis of SARS-CoV-2 by FDA under an Emergency Use Authorization (EUA). This EUA will remain  in effect (meaning this test can be used) for the duration of the COVID-19 declaration under Section 564(b)(1) of the Act, 21 U.S.C.section 360bbb-3(b)(1), unless the authorization is terminated  or revoked sooner.       Influenza A by PCR NEGATIVE NEGATIVE Final   Influenza B by PCR NEGATIVE NEGATIVE Final    Comment: (NOTE) The Xpert Xpress SARS-CoV-2/FLU/RSV plus assay is intended as an aid in the diagnosis of influenza from Nasopharyngeal swab specimens and should not be used as a sole basis for treatment. Nasal washings and aspirates are unacceptable for Xpert Xpress SARS-CoV-2/FLU/RSV testing.  Fact Sheet for Patients: EntrepreneurPulse.com.au  Fact Sheet for Healthcare Providers: IncredibleEmployment.be  This test is not yet approved or cleared by the [place] FDA and has been authorized for detection and/or diagnosis of SARS-CoV-2 by FDA under an Emergency Use Authorization (EUA). This EUA will remain in effect (meaning this test can be used) for the duration of the COVID-19 declaration under Section 564(b)(1) of the Act, 21 U.S.C. section 360bbb-3(b)(1), unless the authorization is terminated or revoked.  Performed at [facility], [place] [address] [place], [place] [address]   MRSA Next Gen by PCR, Nasal     Status: Abnormal   Collection Time: 05/23/21 11:12 AM   Specimen: Nasal Mucosa; Nasal Swab  Result Value Ref Range Status    MRSA by PCR Next Gen DETECTED (A) NOT DETECTED Final    Comment: RESULT CALLED TO, READ BACK BY AND VERIFIED WITH: MELTON,A. RN AT [id number] 05/23/21 MULLINS,T (NOTE) The GeneXpert MRSA Assay (FDA approved for NASAL specimens only), is one component of a comprehensive MRSA colonization surveillance program. It is not intended to diagnose MRSA infection nor to guide or monitor treatment for MRSA infections. Test performance is not FDA approved in patients less than 52 years old. Performed at [facility], [place] [address] [place], [place] [address]   Body fluid culture w Gram Stain     Status: None (Preliminary result)   Collection Time: 05/24/21 11:18 AM   Specimen: PATH Cytology Pleural fluid  Result Value Ref Range Status   Specimen Description   Final    PLEURAL Performed at [place] [address] [place], [place] [address]    Special Requests   Final    NONE Performed at [facility], [place] [address] [place], [place] [address]    Gram Stain   Final    RARE WBC PRESENT, PREDOMINANTLY MONONUCLEAR NO ORGANISMS SEEN    Culture   Final    NO GROWTH 3 DAYS Performed at [facility], [address], [place], [place] [address]    Report Status PENDING  Incomplete  Expectorated Sputum Assessment w Gram Stain, Rflx to Resp Cult     Status: None   Collection Time: 05/25/21  9:06 AM   Specimen: Expectorated Sputum  Result Value Ref Range Status   Specimen Description EXPECTORATED SPUTUM  Final   Special Requests Normal  Final   Sputum evaluation   Final    THIS SPECIMEN IS ACCEPTABLE FOR SPUTUM CULTURE Performed at [facility], [place] [address] [place], [place] [address]    Report Status 05/25/2021 FINAL  Final  Culture, Respiratory w Gram Stain     Status: None   Collection Time: 05/25/21  9:06 AM  Result Value Ref Range Status   Specimen Description   Final    EXPECTORATED SPUTUM Performed at  [facility], [place], [place] [address], [place], [place] [address]    Special Requests   Final    Normal Reflexed from [phone] Performed at [facility], [place] [address], [place], [place] [address]    Gram Stain   Final    RARE WBC PRESENT, PREDOMINANTLY PMN FEW GRAM POSITIVE COCCI IN PAIRS IN CLUSTERS RARE GRAM NEGATIVE RODS    Culture   Final    Normal respiratory flora-no Staph aureus or Pseudomonas seen Performed at [facility], [address], [place], [place] [address]    Report Status 05/27/2021 FINAL  Final  Culture, body fluid w Gram Stain-bottle     Status: None (Preliminary result)   Collection Time: 05/25/21 12:00 PM   Specimen: Pleura  Result Value Ref Range Status   Specimen Description PLEURAL FLUID LEFT  Final   Special Requests NONE  Final   Culture   Final    NO GROWTH 2 DAYS Performed at [facility], [place] [address] [place], [place] [address]    Report Status PENDING  Incomplete  Gram stain     Status: None   Collection Time: 05/25/21 12:00 PM   Specimen: Pleura  Result Value Ref Range Status   Specimen Description PLEURAL FLUID LEFT  Final   Special Requests NONE  Final   Gram Stain   Final    WBC PRESENT,BOTH PMN AND MONONUCLEAR NO ORGANISMS SEEN CYTOSPIN SMEAR Performed at [facility], [address] [place], [place] [address]    Report Status 05/25/2021 FINAL  Final      Studies: No results found.  Scheduled Meds:  vitamin C  250 mg Oral Daily   baclofen  20 mg Oral TID   bisacodyl  10 mg Rectal QODAY   calcium carbonate  3 tablet Oral Q breakfast   chlorhexidine  15 mL Mouth Rinse BID   Chlorhexidine Gluconate Cloth  6 each Topical Daily   cholecalciferol  5,000 Units Oral Daily   clotrimazole  1 application Topical BID   diclofenac Sodium  4 g Topical QID   divalproex  500 mg Oral BID   enoxaparin (LOVENOX) injection  40 mg Subcutaneous Q24H   feeding supplement  237 mL Oral BID BM    fesoterodine  4 mg Oral Daily   fluticasone  1 spray Each Nare Daily   hydrocortisone cream  1 application Topical BID   ipratropium-albuterol  3 mL Nebulization BID   iron polysaccharides  150 mg Oral Q1200   ketoconazole  1 application Topical Once per day on Mon Thu   lactulose  15  g Oral Daily   lisinopril  2.5 mg Oral Daily   loratadine  10 mg Oral Daily   mouth rinse  15 mL Mouth Rinse q12n4p   pantoprazole  40 mg Oral Daily   polyethylene glycol  17 g Oral BID   risperiDONE  2 mg Oral QHS   senna-docusate  2 tablet Oral BID   tamsulosin  0.4 mg Oral QPC supper    Continuous Infusions:  cefTRIAXone (ROCEPHIN)  IV 1 g (05/26/21 1748)     LOS: 4 days     Kayleen Memos, MD [facility] Pager [phone]  If 7PM-7AM, please contact night-coverage [email or web address] Password [email or web address] 05/27/2021, 1:55 PM

## 2021-05-28 ENCOUNTER — Inpatient Hospital Stay (HOSPITAL_COMMUNITY): Payer: Medicare Other

## 2021-05-28 DIAGNOSIS — R0600 Dyspnea, unspecified: Secondary | ICD-10-CM | POA: Diagnosis not present

## 2021-05-28 LAB — BODY FLUID CULTURE W GRAM STAIN: Culture: NO GROWTH

## 2021-05-28 LAB — GLUCOSE, CAPILLARY: Glucose-Capillary: 131 mg/dL — ABNORMAL HIGH (ref 70–99)

## 2021-05-28 MED ORDER — FUROSEMIDE 10 MG/ML IJ SOLN
20.0000 mg | Freq: Once | INTRAMUSCULAR | Status: AC
  Administered 2021-05-28: 20 mg via INTRAVENOUS
  Filled 2021-05-28: qty 2

## 2021-05-28 NOTE — Progress Notes (Signed)
Speech Language Pathology Treatment: Dysphagia  Patient Details Name: [name] MRN: [id number] DOB: [birth year] Today's Date: 05/28/2021 Time: [id number] SLP Time Calculation (min) (ACUTE ONLY): 25 min  Assessment / Plan / Recommendation Clinical Impression  Patient seen for skilled ST session focused on dysphagia goals. Per MD note and per patient's NT, his coughing has decreased. Patient awake and agreeable to having some food from his lunch tray when SLP arrived. He was in bed and SLP not able to achieve full upright sitting position secondary to patient c/o pain in buttocks when HOB raised. He was in a semi-reclined position for PO intake. He did not exhibit any immediate coughing or throat clearing with straw sips of thin liquids (drank rapidly) or bites of soft solids. Mastication appeared adequate and swallow initiation appeared timely. He did exhibit delayed coughing episodes with some movement of secretions which SLP suctioned out of oral cavity. After brief coughing episodes, patient returned to baseline. SLP recommending continue with current diet and will continue to follow patient for toleration and/or need for objective swallow study.   HPI HPI: VIVIANO FLADGER is a 61 y.o. male  who was brought from his group home due to productive cough for about a week. Dx community-acquired pneumonia with concern for aspiration. CXR bilateral pl effusions.  Thoracentesis 12/29.  Prior medical history significant for chronic diastolic CHF, right foot amputee, iron deficiency anemia, anxiety, bipolar disorder, intellectual functional disability, intermittent explosive disorder, obsessive-compulsive disorder, cerebral palsy, chronic bronchitis, constipation, environmental allergies, gastric ulcer with history of UGI bleed necessitating blood transfusion, GERD, hiatal hernia, history of varicella-zoster, hyperlipidemia, history of pneumonia, pressure ulcer of sacral area, history of Pseudomonas infection,  recurrent UTIs, seborrheic dermatitis,, functional quadriplegia. EGD 07/2018 tortuous lower 1/3 of esophagus, large HH.  Evaluated by SLP during recent admissions (March, April, and May of 2022).  MBS 09/07/20: mild dysphagia, mostly oral; no aspiration; strong pharyngeal swallow. He tends to be symptomatic regarding esophageal deficits, with belching/backflow/regurgitation.  Pt was noted to be at increased risk of aspiration and aspiration-related complications at baseline due to his bed-bound status, his being difficult to position adequately during p.o. intake, his frequent dependence for feeding, and baseline esophageal issues.  He was D/Cd at that time (09/2020) on a dysphagia 2 diet with thin liquids.      SLP Plan  Continue with current plan of care      Recommendations for follow up therapy are one component of a multi-disciplinary discharge planning process, led by the attending physician.  Recommendations may be updated based on patient status, additional functional criteria and insurance authorization.    Recommendations  Diet recommendations: Regular;Thin liquid Liquids provided via: Cup;Straw Medication Administration: Whole meds with puree Supervision: Trained caregiver to feed patient Compensations: Small sips/bites;Minimize environmental distractions;Slow rate Postural Changes and/or Swallow Maneuvers: Seated upright 90 degrees;Upright 30-60 min after meal                Oral Care Recommendations: Oral care BID;Staff/trained caregiver to provide oral care Follow Up Recommendations: No SLP follow up Assistance recommended at discharge: Frequent or constant Supervision/Assistance SLP Visit Diagnosis: Dysphagia, pharyngoesophageal phase (R13.14);Dysphagia, oral phase (R13.11) Plan: Continue with current plan of care       Sonia Baller, MA, CCC-SLP Speech Therapy

## 2021-05-28 NOTE — Progress Notes (Signed)
PROGRESS NOTE  MUBASHIR GERE ZOX:[id number] DOB: [birth year] DOA: 05/22/2021 PCP: [place], MD  HPI/Recap of past 24 hours:  [name] is a 61 y.o. male with medical history significant of chronic diastolic CHF, right foot amputee, iron deficiency anemia, anxiety, bipolar disorder, intellectual functional disability, obsessive-compulsive disorder, cerebral palsy, chronic bronchitis, constipation, environmental allergies, gastric ulcer with history of UGI bleed necessitating blood transfusion, GERD, hiatal hernia, history of varicella-zoster, hyperlipidemia, osteomyelitis of right foot, history of pneumonia, pressure ulcer of sacral area, history of Pseudomonas infection, recurrent UTIs, seborrheic dermatitis, vitamin D deficiency, functional quadriplegia who was brought from his group home due to productive cough for about a week.  Work up revealed community-acquired pneumonia with concern for possible aspiration, also revealed large B/L pleural effusions.  He was started on Rocephin and azithromycin empirically.  Post R thoracentesis by IR on 12/29 and post L thoracentesis by IR on 12/30, highly appreciated.  05/28/2021: Seen and examined at his bedside.  Audible sounds noted with concern for recurrent aspiration.  Palliative care team consulted to assist with establishing goals of care.    Assessment/Plan: Principal Problem:   Acute dyspnea Active Problems:   Infantile cerebral palsy (HCC)   Pressure ulcer of foot   Functional quadriplegia (HCC)   Stage 3 skin ulcer of sacral region (HCC)   Microcytic anemia   Lactic acidosis   Iron deficiency anemia   Protein-calorie malnutrition, severe   Chronic diastolic CHF (congestive heart failure) (HCC)  Resolved acute dyspnea secondary to community-acquired pneumonia with concern for recurrent aspiration and large B/L pleural effusions, post bilateral thoracentesis by IR. Speech therapist evaluated, recs reg consistency and thin  fluids with aspiration precautions. Maintain oxygen saturation greater 92% No longer requiring oxygen supplementation post bilateral thoracentesis. Treat underlying conditions Completed 5 days of azithromycin Completed 5 days of Rocephin MRSA screening test positive  Concern for recurrence of aspiration Seen by speech therapist Continue aspiration precautions Palliative care team consulted to assist with establishing goals of care.  Bilateral pleural effusions, s/p bilateral thoracentesis by IR. Personally reviewed chest x-ray showing improvement of pleural effusions. O2 saturation 95% on room air.  Sacral Pressure wounds Continue local wound care with wound care specialist guidance. Continue pain control  Chronic diastolic CHF Continue strict I's and O's and daily weight  Infantile cerebral palsy Functional quadriplegia Muscle relaxers as needed Analgesics as needed  Iron deficiency anemia Hemoglobin 10.6 from 11.8, 2 months ago Monitor H&H  Severe protein calorie malnutrition Continue protein supplementation  Chronic constipation Continue senokot 2 tabs bid, miralax bid Dulcolax sup every other day Smog enema 12/30 Continue bowel regimen  Acute urinary retention Insert Foley catheter after many recurrences on 05/25/2021 Continue Flomax  Goals of care DNR Palliative care team consulted to assist with establishing goals of care   Code Status: DNR  Family Communication: None at bedside  Disposition Plan: Likely return to group home.   Consultants: Palliative care team  Procedures: None  Antimicrobials: Rocephin Azithromycin  DVT prophylaxis: Subcu Lovenox daily  Status is: Inpatient  Patient requires at least 2 midnights for further evaluation and treatment.      Objective: Vitals:   05/27/21 2214 05/28/21 0223 05/28/21 0805 05/28/21 0837  BP: 116/74 102/60 111/80   Pulse: (!) 104 (!) 104    Resp: 20 18    Temp: 98.1 F (36.7 C) 98.8  F (37.1 C)    TempSrc: Oral Oral    SpO2: 98% 90%  92%  Weight:        Intake/Output Summary (Last 24 hours) at 05/28/2021 1558 Last data filed at 05/28/2021 1400 Gross per 24 hour  Intake 477 ml  Output 1000 ml  Net -523 ml   Filed Weights   05/26/21 0544  Weight: 59.8 kg    Exam:  General: 61 y.o. year-old male frail-appearing no acute distress.  He is alert and oriented x3.   Cardiovascular: Regular rate and rhythm no rubs or gallops.   Respiratory: Diffuse rales bilaterally.  Poor inspiratory effort. Abdomen: Soft nontender bowel sounds present Musculoskeletal: Trace lower extremity edema bilaterally.   Skin: Pressure wounds, present admission. Psychiatry: Mood is appropriate for condition and setting.   Data Reviewed: CBC: Recent Labs  Lab 05/22/21 1348 05/24/21 0345 05/26/21 0737 05/27/21 0704  WBC 12.4* 10.1 9.2 10.8*  NEUTROABS 8.8*  --   --   --   HGB 10.6* 10.3* 8.9* 9.8*  HCT 34.1* 32.7* 27.6* 31.3*  MCV 75.1* 74.5* 73.4* 74.2*  PLT 359 493* 564* 713*   Basic Metabolic Panel: Recent Labs  Lab 05/23/21 0345 05/24/21 0351 05/25/21 0420 05/26/21 0437 05/27/21 0413  NA 134* 135 133* 132* 134*  K 4.1 4.4 3.8 4.0 4.0  CL 106 102 102 101 103  CO2 22 23 24 26 25   GLUCOSE 112* 98 87 88 93  BUN 15 21 18 18 20   CREATININE 0.37* 0.47* 0.35* 0.32* <0.30*  CALCIUM 8.6* 8.7* 8.4* 8.6* 8.6*   GFR: CrCl cannot be calculated (This lab value cannot be used to calculate CrCl because it is not a number: <0.30). Liver Function Tests: Recent Labs  Lab 05/22/21 1348  AST 29  ALT 23  ALKPHOS 132*  BILITOT 0.5  PROT 6.9  ALBUMIN 2.4*   No results for input(s): LIPASE, AMYLASE in the last 168 hours. No results for input(s): AMMONIA in the last 168 hours. Coagulation Profile: No results for input(s): INR, PROTIME in the last 168 hours. Cardiac Enzymes: No results for input(s): CKTOTAL, CKMB, CKMBINDEX, TROPONINI in the last 168 hours. BNP (last 3  results) No results for input(s): PROBNP in the last 8760 hours. HbA1C: No results for input(s): HGBA1C in the last 72 hours. CBG: Recent Labs  Lab 05/22/21 1957 05/23/21 0742  GLUCAP 93 100*   Lipid Profile: No results for input(s): CHOL, HDL, LDLCALC, TRIG, CHOLHDL, LDLDIRECT in the last 72 hours. Thyroid Function Tests: No results for input(s): TSH, T4TOTAL, FREET4, T3FREE, THYROIDAB in the last 72 hours. Anemia Panel: No results for input(s): VITAMINB12, FOLATE, FERRITIN, TIBC, IRON, RETICCTPCT in the last 72 hours. Urine analysis:    Component Value Date/Time   COLORURINE YELLOW (A) 02/20/2021 1432   APPEARANCEUR CLEAR (A) 02/20/2021 1432   LABSPEC >1.046 (H) 02/20/2021 1432   PHURINE 6.0 02/20/2021 1432   GLUCOSEU NEGATIVE 02/20/2021 1432   HGBUR NEGATIVE 02/20/2021 1432   HGBUR negative 06/14/2010 1119   BILIRUBINUR NEGATIVE 02/20/2021 1432   BILIRUBINUR SMALL 01/04/2016 1634   KETONESUR NEGATIVE 02/20/2021 1432   PROTEINUR 100 (A) 02/20/2021 1432   UROBILINOGEN 2.0 (H) 02/05/2018 1825   NITRITE NEGATIVE 02/20/2021 1432   LEUKOCYTESUR NEGATIVE 02/20/2021 1432   Sepsis Labs: @LABRCNTIP (procalcitonin:4,lacticidven:4)  ) Recent Results (from the past 240 hour(s))  Resp Panel by RT-PCR (Flu A&B, Covid) Nasopharyngeal Swab     Status: None   Collection Time: 05/22/21  1:48 PM   Specimen: Nasopharyngeal Swab; Nasopharyngeal(NP) swabs in vial transport medium  Result Value Ref Range Status  SARS Coronavirus 2 by RT PCR NEGATIVE NEGATIVE Final    Comment: (NOTE) SARS-CoV-2 target nucleic acids are NOT DETECTED.  The SARS-CoV-2 RNA is generally detectable in upper respiratory specimens during the acute phase of infection. The lowest concentration of SARS-CoV-2 viral copies this assay can detect is 138 copies/mL. A negative result does not preclude SARS-Cov-2 infection and should not be used as the sole basis for treatment or other patient management decisions. A  negative result may occur with  improper specimen collection/handling, submission of specimen other than nasopharyngeal swab, presence of viral mutation(s) within the areas targeted by this assay, and inadequate number of viral copies(<138 copies/mL). A negative result must be combined with clinical observations, patient history, and epidemiological information. The expected result is Negative.  Fact Sheet for Patients:  BloggerCourse.com  Fact Sheet for Healthcare Providers:  SeriousBroker.it  This test is no t yet approved or cleared by the [place] FDA and  has been authorized for detection and/or diagnosis of SARS-CoV-2 by FDA under an Emergency Use Authorization (EUA). This EUA will remain  in effect (meaning this test can be used) for the duration of the COVID-19 declaration under Section 564(b)(1) of the Act, 21 U.S.C.section 360bbb-3(b)(1), unless the authorization is terminated  or revoked sooner.       Influenza A by PCR NEGATIVE NEGATIVE Final   Influenza B by PCR NEGATIVE NEGATIVE Final    Comment: (NOTE) The Xpert Xpress SARS-CoV-2/FLU/RSV plus assay is intended as an aid in the diagnosis of influenza from Nasopharyngeal swab specimens and should not be used as a sole basis for treatment. Nasal washings and aspirates are unacceptable for Xpert Xpress SARS-CoV-2/FLU/RSV testing.  Fact Sheet for Patients: BloggerCourse.com  Fact Sheet for Healthcare Providers: SeriousBroker.it  This test is not yet approved or cleared by the [place] FDA and has been authorized for detection and/or diagnosis of SARS-CoV-2 by FDA under an Emergency Use Authorization (EUA). This EUA will remain in effect (meaning this test can be used) for the duration of the COVID-19 declaration under Section 564(b)(1) of the Act, 21 U.S.C. section 360bbb-3(b)(1), unless the authorization  is terminated or revoked.  Performed at [facility], [address] [place], [place] [address]   MRSA Next Gen by PCR, Nasal     Status: Abnormal   Collection Time: 05/23/21 11:12 AM   Specimen: Nasal Mucosa; Nasal Swab  Result Value Ref Range Status   MRSA by PCR Next Gen DETECTED (A) NOT DETECTED Final    Comment: RESULT CALLED TO, READ BACK BY AND VERIFIED WITH: MELTON,A. RN AT 1251 05/23/21 MULLINS,T (NOTE) The GeneXpert MRSA Assay (FDA approved for NASAL specimens only), is one component of a comprehensive MRSA colonization surveillance program. It is not intended to diagnose MRSA infection nor to guide or monitor treatment for MRSA infections. Test performance is not FDA approved in patients less than 70 years old. Performed at [facility], [address] [place], [place] [address]   Body fluid culture w Gram Stain     Status: None   Collection Time: 05/24/21 11:18 AM   Specimen: PATH Cytology Pleural fluid  Result Value Ref Range Status   Specimen Description   Final    PLEURAL Performed at [facility], [address], [place], [place] [address]    Special Requests   Final    NONE Performed at [facility], [address] [place], [place] [address]    Gram Stain  Final    RARE WBC PRESENT, PREDOMINANTLY MONONUCLEAR NO ORGANISMS SEEN    Culture   Final    NO GROWTH 3 DAYS Performed at [facility], [address], [place], [place] [address]    Report Status 05/28/2021 FINAL  Final  Expectorated Sputum Assessment w Gram Stain, Rflx to Resp Cult     Status: None   Collection Time: 05/25/21  9:06 AM   Specimen: Expectorated Sputum  Result Value Ref Range Status   Specimen Description EXPECTORATED SPUTUM  Final   Special Requests Normal  Final   Sputum evaluation   Final    THIS SPECIMEN IS ACCEPTABLE FOR SPUTUM CULTURE Performed at [facility], [address] [place], [place] [address]    Report Status 05/25/2021 FINAL  Final  Culture, Respiratory w Gram Stain     Status: None   Collection Time: 05/25/21  9:06 AM  Result Value Ref Range Status   Specimen Description   Final    EXPECTORATED SPUTUM Performed at [facility], [address], [place], [place] [address]    Special Requests   Final    Normal Reflexed from [id number] Performed at [facility], [address], [place], [place] [address]    Gram Stain   Final    RARE WBC PRESENT, PREDOMINANTLY PMN FEW GRAM POSITIVE COCCI IN PAIRS IN CLUSTERS RARE GRAM NEGATIVE RODS    Culture   Final    Normal respiratory flora-no Staph aureus or Pseudomonas seen Performed at [facility], [address] [place], [place] [address]    Report Status 05/27/2021 FINAL  Final  Culture, body fluid w Gram Stain-bottle     Status: None (Preliminary result)   Collection Time: 05/25/21 12:00 PM   Specimen: Pleura  Result Value Ref Range Status   Specimen Description PLEURAL FLUID LEFT  Final   Special Requests NONE  Final   Culture   Final    NO GROWTH 3 DAYS Performed at [facility], [address] [place], [place] [address]    Report Status PENDING  Incomplete  Gram stain     Status: None   Collection Time: 05/25/21 12:00 PM   Specimen: Pleura  Result Value Ref Range Status   Specimen Description PLEURAL FLUID LEFT  Final   Special Requests NONE  Final   Gram Stain   Final    WBC PRESENT,BOTH PMN AND MONONUCLEAR NO ORGANISMS SEEN CYTOSPIN SMEAR Performed at [facility], [address], [place], [place] [address]    Report Status 05/25/2021 FINAL  Final      Studies: DG CHEST PORT 1 VIEW  Result Date: 05/28/2021 CLINICAL DATA:  Hypoxia. EXAM: PORTABLE CHEST 1 VIEW COMPARISON:  May 25, 2021. FINDINGS: Stable cardiomediastinal silhouette. Increased bilateral lung opacities are noted concerning for worsening pneumonia or  edema. Small bilateral pleural effusions may be present. Bony thorax is unremarkable. IMPRESSION: Increased bilateral lung opacities as described above. Electronically Signed   By: Lupita Raider M.D.   On: 05/28/2021 08:30    Scheduled Meds:  vitamin C  250 mg Oral Daily   baclofen  20 mg Oral TID   bisacodyl  10 mg Rectal QODAY   calcium carbonate  3 tablet Oral Q breakfast   chlorhexidine  15 mL Mouth Rinse BID   Chlorhexidine Gluconate Cloth  6 each Topical Daily   cholecalciferol  5,000 Units Oral Daily  clotrimazole  1 application Topical BID   diclofenac Sodium  4 g Topical QID   divalproex  500 mg Oral BID   enoxaparin (LOVENOX) injection  40 mg Subcutaneous Q24H   feeding supplement  237 mL Oral BID BM   fesoterodine  4 mg Oral Daily   fluticasone  1 spray Each Nare Daily   hydrocortisone cream  1 application Topical BID   ipratropium-albuterol  3 mL Nebulization BID   iron polysaccharides  150 mg Oral Q1200   ketoconazole  1 application Topical Once per day on Mon Thu   lactulose  15 g Oral Daily   lisinopril  2.5 mg Oral Daily   loratadine  10 mg Oral Daily   mouth rinse  15 mL Mouth Rinse q12n4p   pantoprazole  40 mg Oral Daily   polyethylene glycol  17 g Oral BID   risperiDONE  2 mg Oral QHS   senna-docusate  2 tablet Oral BID   tamsulosin  0.4 mg Oral QPC supper    Continuous Infusions:     LOS: 5 days     Darlin Drop, MD [facility] Pager [phone]  If 7PM-7AM, please contact night-coverage [email or web address] Password [facility] 05/28/2021, 3:58 PM

## 2021-05-29 ENCOUNTER — Encounter (HOSPITAL_BASED_OUTPATIENT_CLINIC_OR_DEPARTMENT_OTHER): Payer: Medicare Other | Attending: Internal Medicine | Admitting: Internal Medicine

## 2021-05-29 DIAGNOSIS — R0600 Dyspnea, unspecified: Secondary | ICD-10-CM | POA: Diagnosis not present

## 2021-05-29 LAB — BASIC METABOLIC PANEL
Anion gap: 5 (ref 5–15)
BUN: 18 mg/dL (ref 8–23)
CO2: 27 mmol/L (ref 22–32)
Calcium: 8.6 mg/dL — ABNORMAL LOW (ref 8.9–10.3)
Chloride: 101 mmol/L (ref 98–111)
Creatinine, Ser: 0.34 mg/dL — ABNORMAL LOW (ref 0.61–1.24)
GFR, Estimated: >60 mL/min (ref >60)
Glucose, Bld: 89 mg/dL (ref 70–99)
Potassium: 4 mmol/L (ref 3.5–5.1)
Sodium: 133 mmol/L — ABNORMAL LOW (ref 135–145)

## 2021-05-29 LAB — CREATININE, SERUM
Creatinine, Ser: 0.36 mg/dL — ABNORMAL LOW (ref 0.61–1.24)
GFR, Estimated: >60 mL/min (ref >60)

## 2021-05-29 LAB — MAGNESIUM: Magnesium: 2.2 mg/dL (ref 1.7–2.4)

## 2021-05-29 LAB — CBC
HCT: 29.1 % — ABNORMAL LOW (ref 39.0–52.0)
Hemoglobin: 9.7 g/dL — ABNORMAL LOW (ref 13.0–17.0)
MCH: 24.4 pg — ABNORMAL LOW (ref 26.0–34.0)
MCHC: 33.3 g/dL (ref 30.0–36.0)
MCV: 73.1 fL — ABNORMAL LOW (ref 80.0–100.0)
Platelets: 731 10*3/uL — ABNORMAL HIGH (ref 150–400)
RBC: 3.98 MIL/uL — ABNORMAL LOW (ref 4.22–5.81)
RDW: 18.6 % — ABNORMAL HIGH (ref 11.5–15.5)
WBC: 12.1 10*3/uL — ABNORMAL HIGH (ref 4.0–10.5)
nRBC: 0 % (ref 0.0–0.2)

## 2021-05-29 LAB — MISC LABCORP TEST (SEND OUT): Labcorp test code: 9985

## 2021-05-29 LAB — PROCALCITONIN: Procalcitonin: <0.1 ng/mL

## 2021-05-29 NOTE — Progress Notes (Signed)
PROGRESS NOTE  [name] [id number] DOB: [birth year] DOA: 05/22/2021 PCP: Audley Hose, MD  HPI/Recap of past 24 hours:  [name] is a 61 y.o. male with medical history significant of chronic diastolic CHF, right foot amputee, iron deficiency anemia, anxiety, bipolar disorder, intellectual functional disability, obsessive-compulsive disorder, cerebral palsy, chronic bronchitis, constipation, environmental allergies, gastric ulcer with history of UGI bleed necessitating blood transfusion, GERD, hiatal hernia, history of varicella-zoster, hyperlipidemia, osteomyelitis of right foot, history of pneumonia, pressure ulcer of sacral area, history of Pseudomonas infection, recurrent UTIs, seborrheic dermatitis, vitamin D deficiency, functional quadriplegia who was brought from his group home due to productive cough for about a week.  Work up revealed community-acquired pneumonia with concern for possible aspiration, also revealed large B/L pleural effusions.  He was started on Rocephin and azithromycin empirically.  Post R thoracentesis by IR on 12/29 and post L thoracentesis by IR on 12/30, highly appreciated.  05/28/2021: Seen and examined at his bedside.  Audible sounds noted with concern for recurrent aspiration.  Palliative care team consulted to assist with establishing goals of care.  05/29/2021: Seen at bedside.  Mildly tachycardic with concern for pneumonitis with recurrent aspiration.  Monitor fever curve and WBC.    Assessment/Plan: Principal Problem:   Acute dyspnea Active Problems:   Infantile cerebral palsy (HCC)   Pressure ulcer of foot   Functional quadriplegia (HCC)   Stage 3 skin ulcer of sacral region (HCC)   Microcytic anemia   Lactic acidosis   Iron deficiency anemia   Protein-calorie malnutrition, severe   Chronic diastolic CHF (congestive heart failure) (HCC)  Resolved acute dyspnea secondary to community-acquired pneumonia with concern for recurrent  aspiration and large B/L pleural effusions, post bilateral thoracentesis by IR. Speech therapist evaluated, recs reg consistency and thin fluids with aspiration precautions. Maintain oxygen saturation greater 92% No longer requiring oxygen supplementation post bilateral thoracentesis. Treat underlying conditions Completed 5 days of azithromycin Completed 5 days of Rocephin MRSA screening test positive  Concern for pneumonitis with recurrent aspiration Seen by speech therapist Continue aspiration precautions Palliative care team consulted to assist with establishing goals of care. Monitor WBC and fever curve. Completed course of IV antibiotics  Bilateral pleural effusions, s/p bilateral thoracentesis by IR. Personally reviewed chest x-ray showing improvement of pleural effusions. O2 saturation 95% on room air.  Sacral Pressure wounds Continue local wound care with wound care specialist guidance. Continue pain control  Chronic diastolic CHF Continue strict I's and O's and daily weight  Infantile cerebral palsy Functional quadriplegia Muscle relaxers as needed Analgesics as needed  Iron deficiency anemia Hemoglobin 10.6 from 11.8, 2 months ago Monitor H&H  Severe protein calorie malnutrition Continue protein supplementation  Chronic constipation Continue senokot 2 tabs bid, miralax bid Dulcolax sup every other day Smog enema 12/30 Continue bowel regimen  Acute urinary retention Insert Foley catheter after many recurrences on 05/25/2021 Continue Flomax  Goals of care DNR Palliative care team consulted to assist with establishing goals of care   Code Status: DNR  Family Communication: None at bedside  Disposition Plan: Likely return to group home.   Consultants: Palliative care team  Procedures: None  Antimicrobials: Rocephin Azithromycin  DVT prophylaxis: Subcu Lovenox daily  Status is: Inpatient  Patient requires at least 2 midnights for  further evaluation and treatment.      Objective: Vitals:   05/28/21 2107 05/28/21 2257 05/29/21 0654 05/29/21 1248  BP:   115/80 (!) 131/92  Pulse:   Marland Kitchen)  101 100  Resp:   18   Temp:  98.9 F (37.2 C) 98.3 F (36.8 C)   TempSrc:  Oral Oral   SpO2: 93%  91% 96%  Weight:   102.1 kg     Intake/Output Summary (Last 24 hours) at 05/29/2021 1858 Last data filed at 05/29/2021 1810 Gross per 24 hour  Intake 1197 ml  Output 925 ml  Net 272 ml   Filed Weights   05/26/21 0544 05/29/21 0654  Weight: 59.8 kg 102.1 kg    Exam:  General: 61 y.o. year-old male frail-appearing no acute distress.  He is alert and oriented x3.   Cardiovascular: Regular rate and rhythm no rubs or gallops.   Respiratory: Diffuse rales bilaterally.  Poor inspiratory effort. Abdomen: Soft nontender bowel sounds present Musculoskeletal: Trace lower extremity edema bilaterally.   Skin: Pressure wounds, present admission. Psychiatry: Mood is appropriate for condition and setting.   Data Reviewed: CBC: Recent Labs  Lab 05/24/21 0345 05/26/21 0737 05/27/21 0704 05/29/21 0631  WBC 10.1 9.2 10.8* 12.1*  HGB 10.3* 8.9* 9.8* 9.7*  HCT 32.7* 27.6* 31.3* 29.1*  MCV 74.5* 73.4* 74.2* 73.1*  PLT 493* 564* 713* [id number]*   Basic Metabolic Panel: Recent Labs  Lab 05/24/21 0351 05/25/21 0420 05/26/21 0437 05/27/21 0413 05/29/21 0503 05/29/21 0631  NA 135 133* 132* 134*  --  133*  K 4.4 3.8 4.0 4.0  --  4.0  CL 102 102 101 103  --  101  CO2 23 24 26 25   --  27  GLUCOSE 98 87 88 93  --  89  BUN 21 18 18 20   --  18  CREATININE 0.47* 0.35* 0.32* <0.30* 0.36* 0.34*  CALCIUM 8.7* 8.4* 8.6* 8.6*  --  8.6*  MG  --   --   --   --   --  2.2   GFR: Estimated Creatinine Clearance: 119.9 mL/min (A) (by C-G formula based on SCr of 0.34 mg/dL (L)). Liver Function Tests: No results for input(s): AST, ALT, ALKPHOS, BILITOT, PROT, ALBUMIN in the last 168 hours.  No results for input(s): LIPASE, AMYLASE in the last 168  hours. No results for input(s): AMMONIA in the last 168 hours. Coagulation Profile: No results for input(s): INR, PROTIME in the last 168 hours. Cardiac Enzymes: No results for input(s): CKTOTAL, CKMB, CKMBINDEX, TROPONINI in the last 168 hours. BNP (last 3 results) No results for input(s): PROBNP in the last 8760 hours. HbA1C: No results for input(s): HGBA1C in the last 72 hours. CBG: Recent Labs  Lab 05/22/21 1957 05/23/21 0742 05/28/21 2058  GLUCAP 93 100* 131*   Lipid Profile: No results for input(s): CHOL, HDL, LDLCALC, TRIG, CHOLHDL, LDLDIRECT in the last 72 hours. Thyroid Function Tests: No results for input(s): TSH, T4TOTAL, FREET4, T3FREE, THYROIDAB in the last 72 hours. Anemia Panel: No results for input(s): VITAMINB12, FOLATE, FERRITIN, TIBC, IRON, RETICCTPCT in the last 72 hours. Urine analysis:    Component Value Date/Time   COLORURINE YELLOW (A) 02/20/2021 1432   APPEARANCEUR CLEAR (A) 02/20/2021 1432   LABSPEC >1.046 (H) 02/20/2021 1432   PHURINE 6.0 02/20/2021 1432   GLUCOSEU NEGATIVE 02/20/2021 1432   HGBUR NEGATIVE 02/20/2021 1432   HGBUR negative 06/14/2010 1119   BILIRUBINUR NEGATIVE 02/20/2021 1432   BILIRUBINUR SMALL 01/04/2016 1634   KETONESUR NEGATIVE 02/20/2021 1432   PROTEINUR 100 (A) 02/20/2021 1432   UROBILINOGEN 2.0 (H) 02/05/2018 1825   NITRITE NEGATIVE 02/20/2021 1432   LEUKOCYTESUR NEGATIVE 02/20/2021 1432  Sepsis Labs: @LABRCNTIP (procalcitonin:4,lacticidven:4)  ) Recent Results (from the past 240 hour(s))  Resp Panel by RT-PCR (Flu A&B, Covid) Nasopharyngeal Swab     Status: None   Collection Time: 05/22/21  1:48 PM   Specimen: Nasopharyngeal Swab; Nasopharyngeal(NP) swabs in vial transport medium  Result Value Ref Range Status   SARS Coronavirus 2 by RT PCR NEGATIVE NEGATIVE Final    Comment: (NOTE) SARS-CoV-2 target nucleic acids are NOT DETECTED.  The SARS-CoV-2 RNA is generally detectable in upper respiratory specimens  during the acute phase of infection. The lowest concentration of SARS-CoV-2 viral copies this assay can detect is 138 copies/mL. A negative result does not preclude SARS-Cov-2 infection and should not be used as the sole basis for treatment or other patient management decisions. A negative result may occur with  improper specimen collection/handling, submission of specimen other than nasopharyngeal swab, presence of viral mutation(s) within the areas targeted by this assay, and inadequate number of viral copies(<138 copies/mL). A negative result must be combined with clinical observations, patient history, and epidemiological information. The expected result is Negative.  Fact Sheet for Patients:  EntrepreneurPulse.com.au  Fact Sheet for Healthcare Providers:  IncredibleEmployment.be  This test is no t yet approved or cleared by the [place] FDA and  has been authorized for detection and/or diagnosis of SARS-CoV-2 by FDA under an Emergency Use Authorization (EUA). This EUA will remain  in effect (meaning this test can be used) for the duration of the COVID-19 declaration under Section 564(b)(1) of the Act, 21 U.S.C.section 360bbb-3(b)(1), unless the authorization is terminated  or revoked sooner.       Influenza A by PCR NEGATIVE NEGATIVE Final   Influenza B by PCR NEGATIVE NEGATIVE Final    Comment: (NOTE) The Xpert Xpress SARS-CoV-2/FLU/RSV plus assay is intended as an aid in the diagnosis of influenza from Nasopharyngeal swab specimens and should not be used as a sole basis for treatment. Nasal washings and aspirates are unacceptable for Xpert Xpress SARS-CoV-2/FLU/RSV testing.  Fact Sheet for Patients: EntrepreneurPulse.com.au  Fact Sheet for Healthcare Providers: IncredibleEmployment.be  This test is not yet approved or cleared by the [place] FDA and has been authorized for detection  and/or diagnosis of SARS-CoV-2 by FDA under an Emergency Use Authorization (EUA). This EUA will remain in effect (meaning this test can be used) for the duration of the COVID-19 declaration under Section 564(b)(1) of the Act, 21 U.S.C. section 360bbb-3(b)(1), unless the authorization is terminated or revoked.  Performed at [facility], [place] [address], [place], [place] [address]   MRSA Next Gen by PCR, Nasal     Status: Abnormal   Collection Time: 05/23/21 11:12 AM   Specimen: Nasal Mucosa; Nasal Swab  Result Value Ref Range Status   MRSA by PCR Next Gen DETECTED (A) NOT DETECTED Final    Comment: RESULT CALLED TO, READ BACK BY AND VERIFIED WITH: MELTON,A. RN AT [id number] 05/23/21 MULLINS,T (NOTE) The GeneXpert MRSA Assay (FDA approved for NASAL specimens only), is one component of a comprehensive MRSA colonization surveillance program. It is not intended to diagnose MRSA infection nor to guide or monitor treatment for MRSA infections. Test performance is not FDA approved in patients less than 58 years old. Performed at [facility], [place] [address] [place],  [address]   Body fluid culture w Gram Stain     Status: None   Collection Time: 05/24/21 11:18 AM   Specimen: PATH Cytology Pleural fluid  Result Value Ref Range  Status   Specimen Description   Final    PLEURAL Performed at [place] [address], [place], [place] [address]    Special Requests   Final    NONE Performed at [facility], [place] [address] [place], [place] [address]    Gram Stain   Final    RARE WBC PRESENT, PREDOMINANTLY MONONUCLEAR NO ORGANISMS SEEN    Culture   Final    NO GROWTH 3 DAYS Performed at [facility], [place] [address] [place], [place] [address]    Report Status 05/28/2021 FINAL  Final  Expectorated Sputum Assessment w Gram Stain, Rflx to Resp Cult     Status: None   Collection Time: 05/25/21  9:06 AM    Specimen: Expectorated Sputum  Result Value Ref Range Status   Specimen Description EXPECTORATED SPUTUM  Final   Special Requests Normal  Final   Sputum evaluation   Final    THIS SPECIMEN IS ACCEPTABLE FOR SPUTUM CULTURE Performed at [facility], [place] [address] [place], [place] [address]    Report Status 05/25/2021 FINAL  Final  Culture, Respiratory w Gram Stain     Status: None   Collection Time: 05/25/21  9:06 AM  Result Value Ref Range Status   Specimen Description   Final    EXPECTORATED SPUTUM Performed at [facility], [place] [address] [place], [place] [address]    Special Requests   Final    Normal Reflexed from [phone] Performed at [facility], [place] [address] [place], [place] [address]    Gram Stain   Final    RARE WBC PRESENT, PREDOMINANTLY PMN FEW GRAM POSITIVE COCCI IN PAIRS IN CLUSTERS RARE GRAM NEGATIVE RODS    Culture   Final    Normal respiratory flora-no Staph aureus or Pseudomonas seen Performed at [facility], [address], [place], [place] [address]    Report Status 05/27/2021 FINAL  Final  Culture, body fluid w Gram Stain-bottle     Status: None (Preliminary result)   Collection Time: 05/25/21 12:00 PM   Specimen: Pleura  Result Value Ref Range Status   Specimen Description PLEURAL FLUID LEFT  Final   Special Requests NONE  Final   Culture   Final    NO GROWTH 4 DAYS Performed at [place] [address] [place],  [address]    Report Status PENDING  Incomplete  Gram stain     Status: None   Collection Time: 05/25/21 12:00 PM   Specimen: Pleura  Result Value Ref Range Status   Specimen Description PLEURAL FLUID LEFT  Final   Special Requests NONE  Final   Gram Stain   Final    WBC PRESENT,BOTH PMN AND MONONUCLEAR NO ORGANISMS SEEN CYTOSPIN SMEAR Performed at [facility], [address] [place],  [address]    Report Status 05/25/2021 FINAL   Final      Studies: No results found.  Scheduled Meds:  vitamin C  250 mg Oral Daily   baclofen  20 mg Oral TID   bisacodyl  10 mg Rectal QODAY   calcium carbonate  3 tablet Oral Q breakfast   chlorhexidine  15 mL Mouth Rinse BID   Chlorhexidine Gluconate Cloth  6 each Topical Daily   cholecalciferol  5,000 Units Oral Daily   clotrimazole  1 application Topical BID   diclofenac Sodium  4 g Topical QID  divalproex  500 mg Oral BID   enoxaparin (LOVENOX) injection  40 mg Subcutaneous Q24H   feeding supplement  237 mL Oral BID BM   fesoterodine  4 mg Oral Daily   fluticasone  1 spray Each Nare Daily   hydrocortisone cream  1 application Topical BID   ipratropium-albuterol  3 mL Nebulization BID   iron polysaccharides  150 mg Oral Q1200   ketoconazole  1 application Topical Once per day on Mon Thu   lactulose  15 g Oral Daily   lisinopril  2.5 mg Oral Daily   loratadine  10 mg Oral Daily   mouth rinse  15 mL Mouth Rinse q12n4p   pantoprazole  40 mg Oral Daily   polyethylene glycol  17 g Oral BID   risperiDONE  2 mg Oral QHS   senna-docusate  2 tablet Oral BID   tamsulosin  0.4 mg Oral QPC supper    Continuous Infusions:     LOS: 6 days     Kayleen Memos, MD [facility] Pager [phone]  If 7PM-7AM, please contact night-coverage [email or web address] Password [facility] 05/29/2021, 6:58 PM

## 2021-05-29 NOTE — Progress Notes (Signed)
[facility] ([facility])   Mr.Lapid is our current outpatient palliative pt.  [facility] will continue to follow while hospitalized and resume outpatient palliative services once he is back in his group home.  Venia Carbon RN, BSN [facility] Liaison

## 2021-05-29 NOTE — TOC Progression Note (Signed)
Transition of Care [facility]) - Progression Note    Patient Details  Name: [name] MRN: [id number] Date of Birth: 05/30/1960  [facility]) CM/SW Contact  Purcell Mouton, RN Phone Number: 05/29/2021, 11:15 AM  Clinical Narrative:    Pt is from [facility]. A call was made to [place] spoke with Otila Kluver, who states pt may come back when stable. A call was made to pt's sister with no answer. A call was made to sister in law who states she will have, pt's sister to call CM.    Expected Discharge Plan: Group Home Barriers to Discharge: No Barriers Identified  Expected Discharge Plan and Services Expected Discharge Plan: Group Home       Living arrangements for the past 2 months:  (Group home)                                       Social Determinants of Health (SDOH) Interventions    Readmission Risk Interventions Readmission Risk Prevention Plan 08/03/2020  Post Dischage Appt Complete  Medication Screening Complete  Some recent data might be hidden

## 2021-05-29 NOTE — Progress Notes (Signed)
SLP Cancellation Note  Patient Details Name: [name] MRN: [id number] DOB: 03-Oct-1960   Cancelled treatment:       Reason Eval/Treat Not Completed: Other (comment) (pt currently sleeping on his side, will continue efforts) [name], MS [facility] SLP Acute Rehab Services Office [phone] Pager [phone]   Chales Abrahams 05/29/2021, 9:25 AM

## 2021-05-29 NOTE — TOC Progression Note (Signed)
Transition of Care [facility]) - Progression Note    Patient Details  Name: LAVORIS SPARLING MRN: [id number] Date of Birth: December 16, 1960  Transition of Care [facility]) CM/SW Contact  Geni Bers, RN Phone Number: 05/29/2021, 11:57 AM  Clinical Narrative:     Spoke with pt's sister [name] who agreed with pt going back to Group Home when stable.   Expected Discharge Plan: Group Home Barriers to Discharge: No Barriers Identified  Expected Discharge Plan and Services Expected Discharge Plan: Group Home       Living arrangements for the past 2 months:  (Group home)                                       Social Determinants of Health (SDOH) Interventions    Readmission Risk Interventions Readmission Risk Prevention Plan 08/03/2020  Post Dischage Appt Complete  Medication Screening Complete  Some recent data might be hidden

## 2021-05-30 ENCOUNTER — Inpatient Hospital Stay (HOSPITAL_COMMUNITY): Payer: Medicare Other

## 2021-05-30 DIAGNOSIS — I5032 Chronic diastolic (congestive) heart failure: Secondary | ICD-10-CM | POA: Diagnosis not present

## 2021-05-30 DIAGNOSIS — Z515 Encounter for palliative care: Secondary | ICD-10-CM | POA: Diagnosis not present

## 2021-05-30 DIAGNOSIS — R532 Functional quadriplegia: Secondary | ICD-10-CM

## 2021-05-30 DIAGNOSIS — R509 Fever, unspecified: Secondary | ICD-10-CM

## 2021-05-30 DIAGNOSIS — E43 Unspecified severe protein-calorie malnutrition: Secondary | ICD-10-CM | POA: Diagnosis not present

## 2021-05-30 DIAGNOSIS — J189 Pneumonia, unspecified organism: Secondary | ICD-10-CM

## 2021-05-30 DIAGNOSIS — R0600 Dyspnea, unspecified: Secondary | ICD-10-CM | POA: Diagnosis not present

## 2021-05-30 LAB — CULTURE, BODY FLUID W GRAM STAIN -BOTTLE: Culture: NO GROWTH

## 2021-05-30 MED ORDER — ORAL CARE MOUTH RINSE
15.0000 mL | Freq: Two times a day (BID) | OROMUCOSAL | Status: DC
  Administered 2021-05-30 – 2021-06-21 (×33): 15 mL via OROMUCOSAL

## 2021-05-30 MED ORDER — OXYCODONE HCL 5 MG PO TABS
5.0000 mg | ORAL_TABLET | ORAL | Status: DC | PRN
  Administered 2021-05-30: 10 mg via ORAL
  Administered 2021-05-30: 5 mg via ORAL
  Administered 2021-05-31 – 2021-06-05 (×9): 10 mg via ORAL
  Filled 2021-05-30 (×9): qty 2
  Filled 2021-05-30: qty 1
  Filled 2021-05-30: qty 2

## 2021-05-30 MED ORDER — HYDROMORPHONE HCL 1 MG/ML IJ SOLN
1.0000 mg | INTRAMUSCULAR | Status: DC | PRN
  Administered 2021-05-31 – 2021-06-02 (×3): 2 mg via INTRAVENOUS
  Administered 2021-06-02 – 2021-06-03 (×2): 1 mg via INTRAVENOUS
  Administered 2021-06-03: 2 mg via INTRAVENOUS
  Administered 2021-06-03: 1 mg via INTRAVENOUS
  Administered 2021-06-04: 2 mg via INTRAVENOUS
  Administered 2021-06-04 – 2021-06-05 (×3): 1 mg via INTRAVENOUS
  Filled 2021-05-30 (×3): qty 1
  Filled 2021-05-30: qty 2
  Filled 2021-05-30: qty 1
  Filled 2021-05-30 (×2): qty 2
  Filled 2021-05-30: qty 1
  Filled 2021-05-30: qty 2
  Filled 2021-05-30: qty 1
  Filled 2021-05-30: qty 2

## 2021-05-30 NOTE — Consult Note (Signed)
Consultation Note Date: 05/30/2021   Patient Name: [name]  DOB: [birth year]  MRN: [id number]  Age / Sex: 61 y.o., male  PCP: Audley Hose, MD Referring Physician: Kathie Dike, MD  Reason for Consultation: Establishing goals of care  HPI/Patient Profile: 61 y.o. male  admitted on 05/22/2021    Clinical Assessment and Goals of Care: 61 year old gentleman from a group home, history of chronic diastolic congestive heart failure, history of right foot amputation iron deficiency anemia anxiety bipolar disorder, OCD, cerebral palsy, chronic bronchitis constipation allergies, history of gastric ulcer history of GI bleed, GERD hiatal hernia dyslipidemia history of pressure ulcer of sacral area, history of Pseudomonas infection and recurrent urinary tract infections.  Patient has functional quadriplegia.  Brought in from his group home due to productive cough, work-up revealed community-acquired pneumonia with concern for possible aspiration as well as large bilateral pleural effusions.  Patient admitted to hospital medicine service, started on antibiotics, also underwent a right thoracenteses and a left thoracenteses. Ongoing issues this hospitalization include concern for recurrent aspiration, speech therapist have been following and they have recommended for only comfort feeds.  Patient complains of increased pain, palliative consultation has been requested for pain and on pain symptom management, broad goals of care discussions and for additional support. Patient is awake alert resting in bed.  He is able to verbalize and make his needs known.  He states that the sores are hurting him even more, we talked about pain medicines.  Call placed and also discussed with sister, see below`: Palliative medicine is specialized medical care for people living with serious illness. It focuses on providing relief from the  symptoms and stress of a serious illness. The goal is to improve quality of life for both the patient and the family. Goals of care: Broad aims of medical therapy in relation to the patient's values and preferences. Our aim is to provide medical care aimed at enabling patients to achieve the goals that matter most to them, given the circumstances of their particular medical situation and their constraints.    [facility]  Sister [name] [phone].   SUMMARY OF RECOMMENDATIONS    Agree with DNR Patient is connected with [facility] palliative services at his group home, those notes have been reviewed.  Recommend continuation of outpatient palliative services at his group home. Continue current mode of care.  We will slightly increase pain regimen options and monitor. Broad goals of care discussions undertaken with patient's sister at number mentioned above.  We discussed about concept of comfort feeds as well as intensifying pain regimen and then monitoring.  She is aware of patient's conditions related to aspiration, swallowing difficulties, recurrent pneumonia that have essentially been long-term in nature.  Patient does appear to be in more pain currently, we discussed about adequate pain management options. Thank you for the consult. Recommend discharge back to group home with continuation of palliative services once the patient is stable from medical standpoint.  Code Status/Advance Care Planning: DNR   Symptom Management:  Palliative Prophylaxis:  Aspiration    Psycho-social/Spiritual:  Desire for further Chaplaincy support:yes Additional Recommendations: Caregiving  Support/Resources  Prognosis:  Unable to determine  Discharge Planning: back to group home with continuation of palliative services.       Primary Diagnoses: Present on Admission:  Acute dyspnea  Infantile cerebral palsy (HCC)  Pressure ulcer of foot  Functional quadriplegia (HCC)  Lactic acidosis   Protein-calorie malnutrition, severe  Microcytic anemia  Stage 3 skin ulcer of sacral region [facility])  Iron deficiency anemia  Chronic diastolic CHF (congestive heart failure) ([place])   I have reviewed the medical record, interviewed the patient and family, and examined the patient. The following aspects are pertinent.  Past Medical History:  Diagnosis Date   Acute on chronic diastolic CHF (congestive heart failure) ([place]) 05/22/2021   Amputee 06/2020   right foot   Anemia    microcytic anemia    Anxiety    takes Ativan daily as needed   Bipolar disorder ([place])    takes Depakote daily   CEREBRAL PALSY 07/24/2006   Chronic bronchitis ([place])    "gets it q yr"   Chronic bronchitis ([place])    Chronic diabetic ulcer of foot determined by examination [facility])    Constipation    takes Miralax daily as needed   Depression    takes Risperdal nightly   Environmental allergies    takes Zyrtec daily   GASTRIC ULCER ACUTE WITH HEMORRHAGE 07/24/2006   Gastric ulcer with hemorrhage    hx of    GERD (gastroesophageal reflux disease)    takes Pravacid daily   History of blood transfusion    no abnormal reaction noted   History of hiatal hernia    History of shingles    History of stomach ulcers ~ 1996   Hypercholesterolemia    Intellectual functioning disability    Intermittent explosive disorder    Internal hemorrhoids    MRSA infection 09/06/2019   Obsessive compulsive disorder    Osteomyelitis (HCC)    right foot    Pneumonia "quite a few times"   in 2015 and in June 2016   Pressure ulcer of sacral region    hx of    Pseudomonas aeruginosa infection 09/06/2019   Psychotic disorder ([place])    Recurrent UTI (urinary tract infection)    Seborrheic dermatitis    Severe sepsis (HCC)    Sigmoid diverticulosis    Spastic tetraplegia ([place])    URINARY INCONTINENCE 02/09/2010   takes Detrol daily-occasionally incontinence   Vitamin D deficiency    Weakness    numbness in extremities    Social History   Socioeconomic History   Marital status: Single    Spouse name: Not on file   Number of children: Not on file   Years of education: Not on file   Highest education level: Not on file  Occupational History   Not on file  Tobacco Use   Smoking status: Never   Smokeless tobacco: Never  Substance and Sexual Activity   Alcohol use: No   Drug use: No   Sexual activity: Never  Other Topics Concern   Not on file  Social History Narrative   Not on file   Social Determinants of Health   Financial Resource Strain: Not on file  Food Insecurity: Not on file  Transportation Needs: Not on file  Physical Activity: Not on file  Stress: Not on file  Social Connections: Not on file   Family History  Adopted:  Yes  Problem Relation Age of Onset   Colon cancer Neg Hx    Scheduled Meds:  vitamin C  250 mg Oral Daily   baclofen  20 mg Oral TID   bisacodyl  10 mg Rectal QODAY   calcium carbonate  3 tablet Oral Q breakfast   chlorhexidine  15 mL Mouth Rinse BID   Chlorhexidine Gluconate Cloth  6 each Topical Daily   cholecalciferol  5,000 Units Oral Daily   clotrimazole  1 application Topical BID   diclofenac Sodium  4 g Topical QID   divalproex  500 mg Oral BID   enoxaparin (LOVENOX) injection  40 mg Subcutaneous Q24H   feeding supplement  237 mL Oral BID BM   fesoterodine  4 mg Oral Daily   fluticasone  1 spray Each Nare Daily   hydrocortisone cream  1 application Topical BID   ipratropium-albuterol  3 mL Nebulization BID   iron polysaccharides  150 mg Oral Q1200   ketoconazole  1 application Topical Once per day on Mon Thu   lactulose  15 g Oral Daily   lisinopril  2.5 mg Oral Daily   loratadine  10 mg Oral Daily   mouth rinse  15 mL Mouth Rinse BID   pantoprazole  40 mg Oral Daily   polyethylene glycol  17 g Oral BID   risperiDONE  2 mg Oral QHS   senna-docusate  2 tablet Oral BID   tamsulosin  0.4 mg Oral QPC supper   Continuous Infusions: PRN  Meds:.acetaminophen **OR** acetaminophen, albuterol, alum & mag hydroxide-simeth, bisacodyl, guaiFENesin-dextromethorphan, HYDROmorphone (DILAUDID) injection, liver oil-zinc oxide, LORazepam, ondansetron **OR** ondansetron (ZOFRAN) IV, oxyCODONE Medications Prior to Admission:  Prior to Admission medications   Medication Sig Start Date End Date Taking? Authorizing Provider  acetaminophen (TYLENOL) 500 MG tablet Take 500 mg by mouth every 6 (six) hours as needed (for aches, pains, and/or fevers).   Yes [provider]  albuterol (VENTOLIN HFA) 108 (90 Base) MCG/ACT inhaler Inhale 2 puffs into the lungs every 4 (four) hours as needed for shortness of breath or wheezing. 01/11/20  Yes [provider]  alum & mag hydroxide-simeth (MAALOX/MYLANTA) 200-200-20 MG/5ML suspension Take 15-30 mLs by mouth every 6 (six) hours as needed for indigestion or heartburn (or nausea).   Yes [provider]  baclofen (LIORESAL) 20 MG tablet Take 1 tablet (20 mg total) by mouth 3 (three) times daily. 07/18/16  Yes Kirsteins, Luanna Salk, MD  bisacodyl (DULCOLAX) 10 MG suppository Place 10 mg rectally every other day.   Yes [provider]  bisacodyl (DULCOLAX) 5 MG EC tablet Take 1 tablet (5 mg total) by mouth daily as needed for moderate constipation. 12/15/20  Yes Annita Brod, MD  brompheniramine-pseudoephedrine (DIMETAPP) 1-15 MG/5ML ELIX Take 5 mLs by mouth 2 (two) times daily as needed (cold symptoms).   Yes [provider]  cetirizine (ZYRTEC) 10 MG tablet Take 1 tablet (10 mg total) by mouth daily. 11/01/14  Yes Street, Sharon Mt, MD  Cholecalciferol (VITAMIN D) 125 MCG (5000 UT) CAPS Take 5,000 Units by mouth daily.   Yes [provider]  clotrimazole (LOTRIMIN) 1 % cream Apply 1 application topically 2 (two) times daily.   Yes [provider]  diclofenac Sodium (VOLTAREN) 1 % GEL Apply 4 g topically See admin instructions. Apply 4 grams to painful  areas four times a day Patient taking differently: Apply 4 g topically 4 (four) times daily. 12/15/20  Yes Annita Brod,  MD  divalproex (DEPAKOTE SPRINKLE) 125 MG capsule Take 500 mg by mouth 2 (two) times daily. (0700 & 2000)   Yes [provider]  Ensure Plus (ENSURE PLUS) LIQD Take 237 mLs by mouth 2 (two) times daily between meals. Chocolate   Yes [provider]  FERREX 150 150 MG capsule Take 150 mg by mouth daily at 12 noon.   Yes [provider]  fluticasone (FLONASE) 50 MCG/ACT nasal spray Place 1 spray into both nostrils daily. (0700) 12/21/19  Yes [provider]  Foot Care Products (PREVALON HEEL PROTECTOR) MISC 1 Device by Does not apply route daily. Patient taking differently: 1 Device by Does not apply route at bedtime. 06/20/15  Yes Verner Mould, MD  guaiFENesin-dextromethorphan [facility] DM) 100-10 MG/5ML syrup Take 10 mLs by mouth every 4 (four) hours as needed for cough.    Yes [provider]  hydrocortisone cream 1 % Apply 1 application topically 2 (two) times daily.   Yes [provider]  hydrocortisone cream 1 % Apply 1 application topically 2 (two) times daily.   Yes [provider]  ketoconazole (NIZORAL) 2 % shampoo Apply 1 application topically 2 (two) times a week.   Yes [provider]  lactulose (CHRONULAC) 10 GM/15ML solution Take 15 mLs (10 g total) by mouth daily. Patient taking differently: Take 15 g by mouth daily. 12/16/20  Yes Annita Brod, MD  liver oil-zinc oxide (DESITIN) 40 % ointment Apply 1 application topically 2 (two) times daily as needed (unspecified). Patient taking differently: Apply 1 application topically 2 (two) times daily as needed for irritation. 12/15/20  Yes Annita Brod, MD  LORazepam (ATIVAN) 2 MG tablet Take 1 mg by mouth every 4 (four) hours as needed for anxiety.   Yes [provider]  neomycin-bacitracin-polymyxin (NEOSPORIN)  [id number] ointment Apply 1 application topically daily as needed (cuts, scrapes).   Yes [provider]  omeprazole (PRILOSEC) 40 MG capsule Take 40 mg by mouth 2 (two) times daily.   Yes [provider]  ondansetron (ZOFRAN) 4 MG tablet Take 1 tablet (4 mg total) by mouth every 6 (six) hours as needed for nausea. 08/03/20  Yes Sheikh, Omair Latif, DO  Oyster Shell 500 MG TABS Take 1,500 mg by mouth daily.   Yes [provider]  polyethylene glycol powder (GLYCOLAX/MIRALAX) 17 GM/SCOOP powder Take 17 g by mouth 2 (two) times daily. Mix 17 grams in 8 oz of beverage of choice and take by mouth.   Yes [provider]  risperiDONE (RISPERDAL) 2 MG tablet Take 2 mg by mouth at bedtime.   Yes [provider]  SANTYL ointment Apply 1 application topically See admin instructions. Apply to wound in the sacral area once a day   Yes [provider]  senna-docusate (SENOKOT-S) 8.6-50 MG tablet Take 1 tablet by mouth at bedtime.   Yes [provider]  sodium hypochlorite (DAKIN'S 1/4 STRENGTH) 0.125 % SOLN Irrigate with 1 application as directed 2 (two) times daily.   Yes [provider]  tolterodine (DETROL LA) 2 MG 24 hr capsule Take 2 mg by mouth daily.   Yes [provider]  tretinoin (RETIN-A) 0.025 % gel Apply 1 application topically at bedtime. Apply to the affected areas of the face.   Yes [provider]  vitamin C (ASCORBIC ACID) 250 MG tablet Take 250 mg by mouth daily.   Yes [provider]  Zinc Oxide (DESITIN) 40 % PSTE Apply  1 application topically daily as needed (to buttocks and scrotum).   Yes [provider]  zinc oxide 20 % ointment Apply 1 application topically daily as needed for irritation (for maceration to periwound with each dressing change).   Yes [provider]  HYDROcodone-acetaminophen (NORCO/VICODIN) 5-325 MG tablet Take 1 tablet by mouth every 4 (four) hours as needed  for severe pain. Patient not taking: Reported on 05/22/2021 12/15/20   Annita Brod, MD   Allergies  Allergen Reactions   Adhesive [Tape] Rash   Review of Systems +back pain  Physical Exam 61 year old gentleman sitting up in bed He is awake alert Has coarse rales bilaterally Has chronic contractures Awake alert oriented, answers all questions appropriately S1-S2 Complains of back pain  Vital Signs: BP 114/73 (BP Location: Right Arm)    Pulse 98    Temp 99.2 F (37.3 C) (Axillary)    Resp 19    Wt 97.7 kg    SpO2 94%    BMI 29.21 kg/m  Pain Scale: 0-10 POSS *See Group Information*: 1-Acceptable,Awake and alert Pain Score: 5  (Pallative MD at bedside and notified.)   SpO2: SpO2: 94 % O2 Device:SpO2: 94 % O2 Flow Rate: .O2 Flow Rate (L/min): 3 L/min  IO: Intake/output summary:  Intake/Output Summary (Last 24 hours) at 05/30/2021 1330 Last data filed at 05/30/2021 0901 Gross per 24 hour  Intake 960 ml  Output 650 ml  Net 310 ml    LBM: Last BM Date: 05/28/21 Baseline Weight: Weight: 59.8 kg Most recent weight: Weight: 97.7 kg     Palliative Assessment/Data:   PPS 50%  Time In:  12 Time Out:  1300 Time Total:  60  Greater than 50%  of this time was spent counseling and coordinating care related to the above assessment and plan.  Signed by: Loistine Chance, MD   Please contact Palliative Medicine Team phone at [phone] for questions and concerns.  For individual provider: See Shea Evans

## 2021-05-30 NOTE — Progress Notes (Signed)
PROGRESS NOTE  [name] [id number] DOB: 1960-09-03 DOA: 05/22/2021 PCP: Audley Hose, MD  HPI/Recap of past 24 hours:  [name] is a 61 y.o. male with medical history significant of chronic diastolic CHF, right foot amputee, iron deficiency anemia, anxiety, bipolar disorder, intellectual functional disability, obsessive-compulsive disorder, cerebral palsy, chronic bronchitis, constipation, environmental allergies, gastric ulcer with history of UGI bleed necessitating blood transfusion, GERD, hiatal hernia, history of varicella-zoster, hyperlipidemia, osteomyelitis of right foot, history of pneumonia, pressure ulcer of sacral area, history of Pseudomonas infection, recurrent UTIs, seborrheic dermatitis, vitamin D deficiency, functional quadriplegia who was brought from his group home due to productive cough for about a week.  Work up revealed community-acquired pneumonia with concern for possible aspiration, also revealed large B/L pleural effusions.  He was started on Rocephin and azithromycin empirically.  Post R thoracentesis by IR on 12/29 and post L thoracentesis by IR on 12/30, highly appreciated.  05/30/2021: complaining of pain in his sacral area, also in his left fingers. Reports this is chronic pain and not different than it has been for quite some time now.    Assessment/Plan: Principal Problem:   Acute dyspnea Active Problems:   Infantile cerebral palsy (HCC)   Pressure ulcer of foot   Functional quadriplegia (HCC)   Stage 3 skin ulcer of sacral region (HCC)   Microcytic anemia   Lactic acidosis   Iron deficiency anemia   Protein-calorie malnutrition, severe   Chronic diastolic CHF (congestive heart failure) (HCC)  Resolved acute dyspnea secondary to community-acquired pneumonia with concern for recurrent aspiration and large B/L pleural effusions, post bilateral thoracentesis by IR. Speech therapist evaluated, recs reg consistency and thin fluids with  aspiration precautions. Maintain oxygen saturation greater 92% No longer requiring oxygen supplementation post bilateral thoracentesis. Treat underlying conditions Completed 5 days of azithromycin Completed 5 days of Rocephin MRSA screening test positive  Concern for pneumonitis with recurrent aspiration Seen by speech therapist Continue aspiration precautions Palliative care team consulted to assist with establishing goals of care. Completed course of IV antibiotics -wbc count trending up and mild fever overnight, will repeat chest xray  Bilateral pleural effusions, s/p bilateral thoracentesis by IR. Personally reviewed chest x-ray showing improvement of pleural effusions. O2 saturation 95% on room air. -since he has had recurrence of fever, will repeat chest xray  Sacral Pressure wounds, present on admission Continue local wound care with wound care specialist guidance. Continue pain control  Chronic diastolic CHF Continue strict I's and O's and daily weight  Infantile cerebral palsy Functional quadriplegia Muscle relaxers as needed Analgesics as needed  Iron deficiency anemia Hemoglobin 10.6 from 11.8, 2 months ago Monitor H&H  Severe protein calorie malnutrition Continue protein supplementation  Chronic constipation Continue senokot 2 tabs bid, miralax bid Dulcolax sup every other day Smog enema 12/30 Continue bowel regimen  Acute urinary retention Insert Foley catheter after many recurrences on 05/25/2021 Continue Flomax  Goals of care DNR Palliative care team consulted to assist with establishing goals of care   Code Status: DNR  Family Communication: None at bedside  Disposition Plan: Likely return to group home.   Consultants: Palliative care team  Procedures: None  Antimicrobials: Rocephin Azithromycin  DVT prophylaxis: Subcu Lovenox daily  Status is: Inpatient  Patient requires at least 2 midnights for further evaluation and  treatment.      Objective: Vitals:   05/30/21 0050 05/30/21 0250 05/30/21 0500 05/30/21 0603  BP: 114/67 110/86  114/73  Pulse: 100 (!)  103  98  Resp: 12 20  19   Temp: 98.8 F (37.1 C) 98.6 F (37 C)  99.2 F (37.3 C)  TempSrc: Axillary Axillary  Axillary  SpO2: 92% 90%  94%  Weight:   97.7 kg     Intake/Output Summary (Last 24 hours) at 05/30/2021 1541 Last data filed at 05/30/2021 0901 Gross per 24 hour  Intake 600 ml  Output 650 ml  Net -50 ml   Filed Weights   05/26/21 0544 05/29/21 0654 05/30/21 0500  Weight: 59.8 kg 102.1 kg 97.7 kg    Exam:  General exam: Alert, awake, oriented x 3 Respiratory system: bilateral rhonchi. Respiratory effort normal. Cardiovascular system:RRR. No murmurs, rubs, gallops. Gastrointestinal system: Abdomen is nondistended, soft and nontender. No organomegaly or masses felt. Normal bowel sounds heard. Extremities: right lower extremity amputation Skin: pressure wounds on sacrum, present on admission Psychiatry: Judgement and insight appear normal. Mood & affect appropriate.     Data Reviewed: CBC: Recent Labs  Lab 05/24/21 0345 05/26/21 0737 05/27/21 0704 05/29/21 0631  WBC 10.1 9.2 10.8* 12.1*  HGB 10.3* 8.9* 9.8* 9.7*  HCT 32.7* 27.6* 31.3* 29.1*  MCV 74.5* 73.4* 74.2* 73.1*  PLT 493* 564* 713* [id number]*   Basic Metabolic Panel: Recent Labs  Lab 05/24/21 0351 05/25/21 0420 05/26/21 0437 05/27/21 0413 05/29/21 0503 05/29/21 0631  NA 135 133* 132* 134*  --  133*  K 4.4 3.8 4.0 4.0  --  4.0  CL 102 102 101 103  --  101  CO2 23 24 26 25   --  27  GLUCOSE 98 87 88 93  --  89  BUN 21 18 18 20   --  18  CREATININE 0.47* 0.35* 0.32* <0.30* 0.36* 0.34*  CALCIUM 8.7* 8.4* 8.6* 8.6*  --  8.6*  MG  --   --   --   --   --  2.2   GFR: Estimated Creatinine Clearance: 117.4 mL/min (A) (by C-G formula based on SCr of 0.34 mg/dL (L)). Liver Function Tests: No results for input(s): AST, ALT, ALKPHOS, BILITOT, PROT, ALBUMIN in the last  168 hours.  No results for input(s): LIPASE, AMYLASE in the last 168 hours. No results for input(s): AMMONIA in the last 168 hours. Coagulation Profile: No results for input(s): INR, PROTIME in the last 168 hours. Cardiac Enzymes: No results for input(s): CKTOTAL, CKMB, CKMBINDEX, TROPONINI in the last 168 hours. BNP (last 3 results) No results for input(s): PROBNP in the last 8760 hours. HbA1C: No results for input(s): HGBA1C in the last 72 hours. CBG: Recent Labs  Lab 05/28/21 2058  GLUCAP 131*   Lipid Profile: No results for input(s): CHOL, HDL, LDLCALC, TRIG, CHOLHDL, LDLDIRECT in the last 72 hours. Thyroid Function Tests: No results for input(s): TSH, T4TOTAL, FREET4, T3FREE, THYROIDAB in the last 72 hours. Anemia Panel: No results for input(s): VITAMINB12, FOLATE, FERRITIN, TIBC, IRON, RETICCTPCT in the last 72 hours. Urine analysis:    Component Value Date/Time   COLORURINE YELLOW (A) 02/20/2021 1432   APPEARANCEUR CLEAR (A) 02/20/2021 1432   LABSPEC >1.046 (H) 02/20/2021 1432   PHURINE 6.0 02/20/2021 1432   GLUCOSEU NEGATIVE 02/20/2021 1432   HGBUR NEGATIVE 02/20/2021 1432   HGBUR negative 06/14/2010 1119   BILIRUBINUR NEGATIVE 02/20/2021 1432   BILIRUBINUR SMALL 01/04/2016 1634   KETONESUR NEGATIVE 02/20/2021 1432   PROTEINUR 100 (A) 02/20/2021 1432   UROBILINOGEN 2.0 (H) 02/05/2018 1825   NITRITE NEGATIVE 02/20/2021 1432   LEUKOCYTESUR NEGATIVE 02/20/2021 1432  Sepsis Labs: @LABRCNTIP (procalcitonin:4,lacticidven:4)  ) Recent Results (from the past 240 hour(s))  Resp Panel by RT-PCR (Flu A&B, Covid) Nasopharyngeal Swab     Status: None   Collection Time: 05/22/21  1:48 PM   Specimen: Nasopharyngeal Swab; Nasopharyngeal(NP) swabs in vial transport medium  Result Value Ref Range Status   SARS Coronavirus 2 by RT PCR NEGATIVE NEGATIVE Final    Comment: (NOTE) SARS-CoV-2 target nucleic acids are NOT DETECTED.  The SARS-CoV-2 RNA is generally detectable in  upper respiratory specimens during the acute phase of infection. The lowest concentration of SARS-CoV-2 viral copies this assay can detect is 138 copies/mL. A negative result does not preclude SARS-Cov-2 infection and should not be used as the sole basis for treatment or other patient management decisions. A negative result may occur with  improper specimen collection/handling, submission of specimen other than nasopharyngeal swab, presence of viral mutation(s) within the areas targeted by this assay, and inadequate number of viral copies(<138 copies/mL). A negative result must be combined with clinical observations, patient history, and epidemiological information. The expected result is Negative.  Fact Sheet for Patients:  EntrepreneurPulse.com.au  Fact Sheet for Healthcare Providers:  IncredibleEmployment.be  This test is no t yet approved or cleared by the [place] FDA and  has been authorized for detection and/or diagnosis of SARS-CoV-2 by FDA under an Emergency Use Authorization (EUA). This EUA will remain  in effect (meaning this test can be used) for the duration of the COVID-19 declaration under Section 564(b)(1) of the Act, 21 U.S.C.section 360bbb-3(b)(1), unless the authorization is terminated  or revoked sooner.       Influenza A by PCR NEGATIVE NEGATIVE Final   Influenza B by PCR NEGATIVE NEGATIVE Final    Comment: (NOTE) The Xpert Xpress SARS-CoV-2/FLU/RSV plus assay is intended as an aid in the diagnosis of influenza from Nasopharyngeal swab specimens and should not be used as a sole basis for treatment. Nasal washings and aspirates are unacceptable for Xpert Xpress SARS-CoV-2/FLU/RSV testing.  Fact Sheet for Patients: EntrepreneurPulse.com.au  Fact Sheet for Healthcare Providers: IncredibleEmployment.be  This test is not yet approved or cleared by the [place] FDA and has been  authorized for detection and/or diagnosis of SARS-CoV-2 by FDA under an Emergency Use Authorization (EUA). This EUA will remain in effect (meaning this test can be used) for the duration of the COVID-19 declaration under Section 564(b)(1) of the Act, 21 U.S.C. section 360bbb-3(b)(1), unless the authorization is terminated or revoked.  Performed at [facility], [place] [address], [place], [place] [address]   MRSA Next Gen by PCR, Nasal     Status: Abnormal   Collection Time: 05/23/21 11:12 AM   Specimen: Nasal Mucosa; Nasal Swab  Result Value Ref Range Status   MRSA by PCR Next Gen DETECTED (A) NOT DETECTED Final    Comment: RESULT CALLED TO, READ BACK BY AND VERIFIED WITH: MELTON,A. RN AT [id number] 05/23/21 MULLINS,T (NOTE) The GeneXpert MRSA Assay (FDA approved for NASAL specimens only), is one component of a comprehensive MRSA colonization surveillance program. It is not intended to diagnose MRSA infection nor to guide or monitor treatment for MRSA infections. Test performance is not FDA approved in patients less than 50 years old. Performed at [facility], [place] [address] [place], [place] [address]   Body fluid culture w Gram Stain     Status: None   Collection Time: 05/24/21 11:18 AM   Specimen: PATH Cytology Pleural fluid  Result Value Ref Range  Status   Specimen Description   Final    PLEURAL Performed at [place] [address], [place], [place] [address]    Special Requests   Final    NONE Performed at [facility], [place] [address], [place], [place] [address]    Gram Stain   Final    RARE WBC PRESENT, PREDOMINANTLY MONONUCLEAR NO ORGANISMS SEEN    Culture   Final    NO GROWTH 3 DAYS Performed at [facility], [place] [address], [place], [place] [address]    Report Status 05/28/2021 FINAL  Final  Expectorated Sputum Assessment w Gram Stain, Rflx to Resp Cult     Status: None    Collection Time: 05/25/21  9:06 AM   Specimen: Expectorated Sputum  Result Value Ref Range Status   Specimen Description EXPECTORATED SPUTUM  Final   Special Requests Normal  Final   Sputum evaluation   Final    THIS SPECIMEN IS ACCEPTABLE FOR SPUTUM CULTURE Performed at [facility], [place] [address], [place], [place] [address]    Report Status 05/25/2021 FINAL  Final  Culture, Respiratory w Gram Stain     Status: None   Collection Time: 05/25/21  9:06 AM  Result Value Ref Range Status   Specimen Description   Final    EXPECTORATED SPUTUM Performed at [facility], [place] [address], [place], [place] [address]    Special Requests   Final    Normal Reflexed from [id number] Performed at [facility], [place] [address], [place], [place] [address]    Gram Stain   Final    RARE WBC PRESENT, PREDOMINANTLY PMN FEW GRAM POSITIVE COCCI IN PAIRS IN CLUSTERS RARE GRAM NEGATIVE RODS    Culture   Final    Normal respiratory flora-no Staph aureus or Pseudomonas seen Performed at [facility], [address] [place], [place] [address]    Report Status 05/27/2021 FINAL  Final  Culture, body fluid w Gram Stain-bottle     Status: None   Collection Time: 05/25/21 12:00 PM   Specimen: Pleura  Result Value Ref Range Status   Specimen Description PLEURAL FLUID LEFT  Final   Special Requests NONE  Final   Culture   Final    NO GROWTH 5 DAYS Performed at [place] [address], [place], [place] [address]    Report Status 05/30/2021 FINAL  Final  Gram stain     Status: None   Collection Time: 05/25/21 12:00 PM   Specimen: Pleura  Result Value Ref Range Status   Specimen Description PLEURAL FLUID LEFT  Final   Special Requests NONE  Final   Gram Stain   Final    WBC PRESENT,BOTH PMN AND MONONUCLEAR NO ORGANISMS SEEN CYTOSPIN SMEAR Performed at [facility], [address], [place],  [address]    Report Status  05/25/2021 FINAL  Final      Studies: No results found.  Scheduled Meds:  vitamin C  250 mg Oral Daily   baclofen  20 mg Oral TID   bisacodyl  10 mg Rectal QODAY   calcium carbonate  3 tablet Oral Q breakfast   chlorhexidine  15 mL Mouth Rinse BID   Chlorhexidine Gluconate Cloth  6 each Topical Daily   cholecalciferol  5,000 Units Oral Daily   clotrimazole  1 application Topical BID   diclofenac Sodium  4 g Topical QID  divalproex  500 mg Oral BID   enoxaparin (LOVENOX) injection  40 mg Subcutaneous Q24H   feeding supplement  237 mL Oral BID BM   fesoterodine  4 mg Oral Daily   fluticasone  1 spray Each Nare Daily   hydrocortisone cream  1 application Topical BID   ipratropium-albuterol  3 mL Nebulization BID   iron polysaccharides  150 mg Oral Q1200   ketoconazole  1 application Topical Once per day on Mon Thu   lactulose  15 g Oral Daily   lisinopril  2.5 mg Oral Daily   loratadine  10 mg Oral Daily   mouth rinse  15 mL Mouth Rinse BID   pantoprazole  40 mg Oral Daily   polyethylene glycol  17 g Oral BID   risperiDONE  2 mg Oral QHS   senna-docusate  2 tablet Oral BID   tamsulosin  0.4 mg Oral QPC supper    Continuous Infusions:     LOS: 7 days     Kathie Dike, MD [facility]   If 7PM-7AM, please contact night-coverage [email or web address]  05/30/2021, 3:41 PM

## 2021-05-30 NOTE — Progress Notes (Signed)
Speech Language Pathology Treatment: Dysphagia  Patient Details Name: [name] MRN: [id number] DOB: 22-Jan-1961 Today's Date: 05/30/2021 Time: 0900-0920 SLP Time Calculation (min) (ACUTE ONLY): 20 min  Assessment / Plan / Recommendation Clinical Impression  Pt demonstrates wet vocal quality at baseline and additional hard coughing after sips of thin liquids. Pt very likely with a further decline in swallowing. He is in pain, does not like his meals, does not want thickened liquids when asked. He is aware of physician telling him he cant swallow well and is concerned about this. In this case, given pts history of esophageal dysphagia, chronic bedbound status, chronic pain and cognitive impairment, I would not recommend restricting textures to address dysphagia as this may further worsen pts quality of life with minimal reduction of risk.  If pt/family want further evidence or understanding of the nature of pts dysphagia, an MBS could be completed. But pt likely to be very uncomfortable and difficult to position for this test. Result would be unlikely to impact course of care significantly. Will keep diet in place at this time and await further discussion between palliative care team and pt and his family. Please let me know if I can provide further information that would assist in decision making.    HPI        SLP Plan  Continue with current plan of care      Recommendations for follow up therapy are one component of a multi-disciplinary discharge planning process, led by the attending physician.  Recommendations may be updated based on patient status, additional functional criteria and insurance authorization.    Recommendations  Diet recommendations: Regular;Thin liquid Liquids provided via: Cup;Straw Medication Administration: Whole meds with puree Supervision: Trained caregiver to feed patient Compensations: Small sips/bites;Minimize environmental distractions;Slow rate Postural  Changes and/or Swallow Maneuvers: Seated upright 90 degrees;Upright 30-60 min after meal                Oral Care Recommendations: Oral care BID;Staff/trained caregiver to provide oral care Plan: Continue with current plan of care           Manuel Dall, Katherene Ponto  05/30/2021, 11:31 AM

## 2021-05-31 ENCOUNTER — Inpatient Hospital Stay (HOSPITAL_COMMUNITY): Payer: Medicare Other

## 2021-05-31 DIAGNOSIS — E440 Moderate protein-calorie malnutrition: Secondary | ICD-10-CM | POA: Insufficient documentation

## 2021-05-31 DIAGNOSIS — E43 Unspecified severe protein-calorie malnutrition: Secondary | ICD-10-CM | POA: Diagnosis not present

## 2021-05-31 DIAGNOSIS — Z515 Encounter for palliative care: Secondary | ICD-10-CM | POA: Diagnosis not present

## 2021-05-31 DIAGNOSIS — R0600 Dyspnea, unspecified: Secondary | ICD-10-CM | POA: Diagnosis not present

## 2021-05-31 LAB — CBC
HCT: 27.7 % — ABNORMAL LOW (ref 39.0–52.0)
Hemoglobin: 9 g/dL — ABNORMAL LOW (ref 13.0–17.0)
MCH: 23.9 pg — ABNORMAL LOW (ref 26.0–34.0)
MCHC: 32.5 g/dL (ref 30.0–36.0)
MCV: 73.5 fL — ABNORMAL LOW (ref 80.0–100.0)
Platelets: 780 10*3/uL — ABNORMAL HIGH (ref 150–400)
RBC: 3.77 MIL/uL — ABNORMAL LOW (ref 4.22–5.81)
RDW: 18.6 % — ABNORMAL HIGH (ref 11.5–15.5)
WBC: 10.2 10*3/uL (ref 4.0–10.5)
nRBC: 0.2 % (ref 0.0–0.2)

## 2021-05-31 LAB — BASIC METABOLIC PANEL
Anion gap: 5 (ref 5–15)
BUN: 19 mg/dL (ref 8–23)
CO2: 25 mmol/L (ref 22–32)
Calcium: 8.3 mg/dL — ABNORMAL LOW (ref 8.9–10.3)
Chloride: 103 mmol/L (ref 98–111)
Creatinine, Ser: 0.33 mg/dL — ABNORMAL LOW (ref 0.61–1.24)
GFR, Estimated: >60 mL/min (ref >60)
Glucose, Bld: 87 mg/dL (ref 70–99)
Potassium: 4.1 mmol/L (ref 3.5–5.1)
Sodium: 133 mmol/L — ABNORMAL LOW (ref 135–145)

## 2021-05-31 MED ORDER — PROSOURCE PLUS PO LIQD
30.0000 mL | Freq: Every day | ORAL | Status: DC
  Administered 2021-05-31 – 2021-06-04 (×5): 30 mL via ORAL
  Filled 2021-05-31 (×6): qty 30

## 2021-05-31 MED ORDER — LACTATED RINGERS IV SOLN: INTRAVENOUS | Status: DC

## 2021-05-31 NOTE — Progress Notes (Signed)
Noted upon assessment pt c/o severe back pain. Noted po meds given an hour and a half ago. Pt rates pain as 9/10 currently. Pt repositioned and medicated for comfort

## 2021-05-31 NOTE — Care Management Important Message (Signed)
Important Message  Patient Details IM Letter placed in Patients room. Name: [name] MRN: [id number] Date of Birth: [birth year]   Medicare Important Message Given:  Yes     Caren Macadam 05/31/2021, 2:22 PM

## 2021-05-31 NOTE — Progress Notes (Signed)
Daily Progress Note   Patient Name: [name]       Date: 05/31/2021 DOB: May 19, 1961  Age: 61 y.o. MRN#: [id number] Attending Physician: Kathie Dike, MD Primary Care Physician: Audley Hose, MD Admit Date: 05/22/2021  Reason for Consultation/Follow-up: Establishing goals of care  Subjective:  Resting in bed, no distress   Length of Stay: 8  Current Medications: Scheduled Meds:   (feeding supplement) PROSource Plus  30 mL Oral Daily   vitamin C  250 mg Oral Daily   baclofen  20 mg Oral TID   bisacodyl  10 mg Rectal QODAY   calcium carbonate  3 tablet Oral Q breakfast   chlorhexidine  15 mL Mouth Rinse BID   Chlorhexidine Gluconate Cloth  6 each Topical Daily   cholecalciferol  5,000 Units Oral Daily   clotrimazole  1 application Topical BID   diclofenac Sodium  4 g Topical QID   divalproex  500 mg Oral BID   enoxaparin (LOVENOX) injection  40 mg Subcutaneous Q24H   feeding supplement  237 mL Oral BID BM   fesoterodine  4 mg Oral Daily   fluticasone  1 spray Each Nare Daily   hydrocortisone cream  1 application Topical BID   ipratropium-albuterol  3 mL Nebulization BID   iron polysaccharides  150 mg Oral Q1200   ketoconazole  1 application Topical Once per day on Mon Thu   lactulose  15 g Oral Daily   lisinopril  2.5 mg Oral Daily   loratadine  10 mg Oral Daily   mouth rinse  15 mL Mouth Rinse BID   pantoprazole  40 mg Oral Daily   polyethylene glycol  17 g Oral BID   risperiDONE  2 mg Oral QHS   senna-docusate  2 tablet Oral BID   tamsulosin  0.4 mg Oral QPC supper    Continuous Infusions:   PRN Meds: acetaminophen **OR** acetaminophen, albuterol, alum & mag hydroxide-simeth, bisacodyl, guaiFENesin-dextromethorphan, HYDROmorphone (DILAUDID) injection,  liver oil-zinc oxide, LORazepam, ondansetron **OR** ondansetron (ZOFRAN) IV, oxyCODONE  Physical Exam         Resting in bed No distress S 1 S 2  Regular work of breathing Regular work of breathing Wound care notes reviewed.   Vital Signs: BP (!) 119/58 (BP Location: Left Arm)    Pulse  90    Temp 98.4 F (36.9 C) (Oral)    Resp 16    Wt 96.9 kg    SpO2 92%    BMI 28.97 kg/m  SpO2: SpO2: 92 % O2 Device: O2 Device: Room Air O2 Flow Rate: O2 Flow Rate (L/min): 3 L/min  Intake/output summary: No intake or output data in the 24 hours ending 05/31/21 1158 LBM: Last BM Date: 05/28/21 Baseline Weight: Weight: 59.8 kg Most recent weight: Weight: 96.9 kg       Palliative Assessment/Data:      Patient Active Problem List   Diagnosis Date Noted   Malnutrition of moderate degree 05/31/2021   Acute dyspnea 05/22/2021   Acute on chronic diastolic CHF (congestive heart failure) ([place]) 05/22/2021   Chronic diastolic CHF (congestive heart failure) ([place]) 05/22/2021   Protein-calorie malnutrition, severe 02/28/2021   Iron deficiency anemia 02/20/2021   Obsessive compulsive disorder    Cellulitis of sacral region 12/06/2020   Right pulmonary infiltrate on CXR 10/03/2020   Dysphagia 10/03/2020   GERD without esophagitis 10/03/2020   Aspiration pneumonia ([place]) 10/03/2020   Recurrent right pleural effusion 10/02/2020   Abnormal CT scan, colon    Hx of right BKA ([place]) 09/05/2020   Nausea and vomiting 07/31/2020   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 ([place]) 10/08/2019   Osteomyelitis ([place]) 10/08/2019   Acute osteomyelitis of right ankle or foot (H[facility])    Ulcerated, foot, right, with necrosis of bone (HCC)    Pseudomonas aeruginosa infection 09/06/2019   MRSA infection 09/06/2019   Lactic acidosis    Stage 3 skin ulcer of sacral region ([place]) 08/10/2018   Gastric wall thickening 08/10/2018   Alkaline phosphatase elevation 08/10/2018   Cholelithiasis 08/10/2018   Microcytic anemia     Hiatal hernia 04/07/2018   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 ([place]) 01/20/2016   Functional quadriplegia ([place]) 12/15/2015   Cerebral palsy, quadriplegic ([place])    Pre-diabetes 08/31/2015   Foot ulcer, limited to breakdown of skin ([place]) 06/08/2015   Seasonal allergies 11/01/2014   Pressure ulcer of foot 03/14/2014   CAP (community acquired pneumonia) 03/13/2014   Neuropathy 01/13/2013   Decubitus ulcer of left hip, stage 3 ([place]) 01/08/2013   URINARY INCONTINENCE 02/09/2010   Intellectual disability 07/24/2006   Infantile cerebral palsy ([place]) 07/24/2006    Palliative Care Assessment & Plan   Patient Profile:    Assessment:  61 year old gentleman from a group home, history of chronic diastolic congestive heart failure, history of right foot amputation iron deficiency anemia anxiety bipolar disorder, OCD, cerebral palsy, chronic bronchitis constipation allergies, history of gastric ulcer history of GI bleed, GERD hiatal hernia dyslipidemia history of pressure ulcer of sacral area, history of Pseudomonas infection and recurrent urinary tract infections.  Patient has functional quadriplegia.  Brought in from his group home due to productive cough, work-up revealed community-acquired pneumonia with concern for possible aspiration as well as large bilateral pleural effusions.  Patient admitted to hospital medicine service, started on antibiotics, also underwent a right thoracenteses and a left thoracenteses. Ongoing issues this hospitalization include concern for recurrent aspiration, speech therapist have been following and they have recommended for only comfort feeds.   Recommendations/Plan: Patient has chronic pain from his sacral pressure wounds, continue current pain management.  Monitor oral intake, seen by speech therapist, continue aspiration precautions. Recommend discharge back to group home with continuation of palliative services towards the end of this  hospitalization.    Code Status:  Code Status Orders  (From admission, onward)           Start     Ordered   05/22/21 1709  Do not attempt resuscitation (DNR)  Continuous       Question Answer Comment  In the event of cardiac or respiratory ARREST Do not call a code blue   In the event of cardiac or respiratory ARREST Do not perform Intubation, CPR, defibrillation or ACLS   In the event of cardiac or respiratory ARREST Use medication by any route, position, wound care, and other measures to relive pain and suffering. May use oxygen, suction and manual treatment of airway obstruction as needed for comfort.      05/22/21 1711           Code Status History     Date Active Date Inactive Code Status Order ID Comments User Context   05/22/2021 1604 05/22/2021 1711 Full Code [id number]  [name], MD ED   02/21/2021 0648 03/03/2021 2347 Full Code [id number]  Ivor Costa, MD ED   02/20/2021 1801 02/21/2021 0648 DNR [id number]  Ivor Costa, MD ED   12/06/2020 2258 12/16/2020 0713 Full Code [id number]  Tacey Ruiz, MD Inpatient   10/03/2020 0055 10/06/2020 2346 DNR [id number]  Vernelle Emerald, MD ED   10/03/2020 0055 10/03/2020 0055 DNR [id number]  Vernelle Emerald, MD ED   09/05/2020 1039 09/07/2020 2311 DNR [id number]  Terrilee Croak, MD Inpatient   09/05/2020 0258 09/05/2020 1038 Full Code [id number]  Dwyane Dee, MD ED   07/31/2020 1758 08/04/2020 0323 Full Code [id number]  Bonnielee Haff, MD ED   10/08/2019 1815 10/09/2019 2042 Full Code [id number]  Cherylann Ratel A, DO Inpatient   04/08/2019 1316 04/12/2019 2132 Full Code [id number]  [name], DO ED   08/10/2018 1642 08/15/2018 1737 DNR [id number]  Annita Brod, MD Inpatient   01/14/2016 0946 01/18/2016 2226 DNR [id number]  Dory Horn, NP Inpatient   01/08/2016 1522 01/14/2016 0946 Full Code [id number]  Smiley Houseman, MD Inpatient   10/19/2015 2054 10/22/2015 1646 DNR [id number]  Aquilla Hacker, MD Inpatient    12/14/2014 1236 12/15/2014 1710 Full Code [id number]  Newt Minion, MD Inpatient   10/16/2014 1036 10/20/2014 1813 Full Code [id number]  Leone Brand, MD Inpatient   03/13/2014 0323 03/14/2014 2132 Full Code [id number]  Hilton Sinclair, MD Inpatient       Prognosis:  Unable to determine  Discharge Planning: Group home with palliative.   Care plan was discussed with  IDT  Thank you for allowing the Palliative Medicine Team to assist in the care of this patient.   Time In:  12 Time Out: 12.25 Total Time 25 Prolonged Time Billed No       Greater than 50%  of this time was spent counseling and coordinating care related to the above assessment and plan.  Loistine Chance, MD  Please contact Palliative Medicine Team phone at [phone] for questions and concerns.

## 2021-05-31 NOTE — Progress Notes (Signed)
SLP Cancellation Note  Patient Details Name: [name] MRN: [id number] DOB: 04-04-1961   Cancelled treatment:       Reason Eval/Treat Not Completed: Other (comment). Reviewed Palliative care note; Dr Linna Darner discussed chronic nature of dysphagia with pt and sister with plan to continue palliative services with a focus on pain management. Other than basic precautions (slow rate, upright positioning, encouraging coughing), no further interventions for swallowing are warranted at this time. Will sign off.    Race Latour, Riley Nearing 05/31/2021, 7:52 AM

## 2021-05-31 NOTE — Progress Notes (Addendum)
Nutrition Follow-up  DOCUMENTATION CODES:   Non-severe (moderate) malnutrition in context of chronic illness  INTERVENTION:  - continue Ensure Enlive BID, each supplement provides 350 kcal and 20 grams of protein. - will order 30 ml Prosource Plus once/day, each supplement provides 100 kcal and 15 grams protein.  - discuss possible role of scheduled anti-emetic with meals.    NUTRITION DIAGNOSIS:   Moderate Malnutrition related to chronic illness as evidenced by mild fat depletion, moderate muscle depletion, severe muscle depletion. -ongoing  GOAL:   Patient will meet greater than or equal to 90% of their needs -unmet to minimally met on average  MONITOR:   PO intake, Supplement acceptance, Labs, Weight trends  ASSESSMENT:   61 y.o. male with medical history of CHF, R BKA d/t osteomyelitis, iron deficiency anemia, anxiety, bipolar disorder, intellectual functional disability, obsessive-compulsive disorder, cerebral palsy, chronic bronchitis, constipation, environmental allergies, gastric ulcer with hx of UGIB necessitating blood transfusion, GERD, hiatal hernia, HLD, pressure ulcer of sacral area, history of Pseudomonas infection, recurrent UTIs, seborrheic dermatitis, vitamin D deficiency, functional quadriplegia. He presented to the ED from group home due to productive cough x1 week. In the ED he was noted to have CAP with concern for possible aspiration and noted to have large bilateral pleural effusions. He had R thoracentesis on 12/29 and L thoracentesis on 12/30.  Patient laying in bed with no visitors present at the time of RD visit. RN at bedside and aware of patient reporting severe abdominal pain.   Patient recalls eating scrambled eggs at breakfast. He reports ongoing abdominal pain and that he often feels nauseated and like he is going to vomit when he eats. Order in place for PRN zofran. Additional nutrition-related information unable to be obtained.   Weight today is 214  lb and weight on 12/31 (date of admission) documented as 132 lb. Weight today is significantly up from weights documented 11/12/19-02/22/21.  SLP has signed off. Palliative Care following; PPS 50%. Notes indicate plan to d/c back to group home with outpatient Palliative services.   He has been eating mainly 50-90% at meals over the past 3 days. Ensure Enlive ordered BID on admission date and he has been accepting this supplement nearly 100% of the time offered.   Labs reviewed; Na: 133 mmol/l, creatinine: 0.33 mg/dl, Ca: 8.3 mg/dl.  Medications reviewed; 250 mg ascorbic acid/day, 10 mg rectal dulcolax every other day, 3 tablets os-cal/day, 5000 units cholecalciferol/day, 15 g lactulose/day, 40 mg oral protonix/day, 17 g miralax BID, 2 tablets senokot BID.      NUTRITION - FOCUSED PHYSICAL EXAM:  Flowsheet Row Most Recent Value  Orbital Region No depletion  Upper Arm Region Moderate depletion  Thoracic and Lumbar Region Unable to assess  Buccal Region Mild depletion  Temple Region No depletion  Clavicle Bone Region Moderate depletion  Clavicle and Acromion Bone Region Moderate depletion  Scapular Bone Region Unable to assess  Dorsal Hand Mild depletion  Patellar Region Severe depletion  Anterior Thigh Region Severe depletion  Posterior Calf Region Unable to assess  [R BKA and L side in boot]  Edema (RD Assessment) None  Hair Reviewed  Eyes Reviewed  Mouth Reviewed  Skin Reviewed  Nails Reviewed       Diet Order:   Diet Order             Diet Heart Room service appropriate? Yes; Fluid consistency: Thin; Fluid restriction: 1200 mL Fluid  Diet effective now  EDUCATION NEEDS:   No education needs have been identified at this time  Skin:  Skin Assessment: Skin Integrity Issues: Skin Integrity Issues:: Stage I, Stage II, Diabetic Ulcer Stage I: R ear Stage II: L ear and L knee Diabetic Ulcer: sacrum  Last BM:  1/4 (type 6 x2, large amount both  times)  Height:   Ht Readings from Last 1 Encounters:  12/06/20 6' (1.829 m)    Weight:   Wt Readings from Last 1 Encounters:  05/31/21 96.9 kg    Estimated Nutritional Needs:  Kcal:  1900-2100 kcal Protein:  95-110 grams Fluid:  >/= 2.2 L/day     Jarome Matin, MS, RD, LDN Inpatient Clinical Dietitian RD pager # available in [facility]  After hours/weekend pager # available in [facility]

## 2021-05-31 NOTE — Progress Notes (Signed)
PROGRESS NOTE  [name] [id number] DOB: 05-Sep-1960 DOA: 05/22/2021 PCP: Audley Hose, MD  HPI/Recap of past 24 hours:  [name] is a 61 y.o. male with medical history significant of chronic diastolic CHF, right foot amputee, iron deficiency anemia, anxiety, bipolar disorder, intellectual functional disability, obsessive-compulsive disorder, cerebral palsy, chronic bronchitis, constipation, environmental allergies, gastric ulcer with history of UGI bleed necessitating blood transfusion, GERD, hiatal hernia, history of varicella-zoster, hyperlipidemia, osteomyelitis of right foot, history of pneumonia, pressure ulcer of sacral area, history of Pseudomonas infection, recurrent UTIs, seborrheic dermatitis, vitamin D deficiency, functional quadriplegia who was brought from his group home due to productive cough for about a week.  Work up revealed community-acquired pneumonia with concern for possible aspiration, also revealed large B/L pleural effusions.  He was started on Rocephin and azithromycin empirically.  Post R thoracentesis by IR on 12/29 and post L thoracentesis by IR on 12/30, highly appreciated.  05/30/2021: Reports that pain in sacral area is uncontrolled.  Says he has some cough and shortness of breath.  Reports having bowel movement earlier today.  Abdomen is distended.    Assessment/Plan: Principal Problem:   Acute dyspnea Active Problems:   Infantile cerebral palsy (HCC)   Pressure ulcer of foot   Functional quadriplegia (HCC)   Stage 3 skin ulcer of sacral region (HCC)   Microcytic anemia   Lactic acidosis   Iron deficiency anemia   Protein-calorie malnutrition, severe   Chronic diastolic CHF (congestive heart failure) (HCC)   Malnutrition of moderate degree  Resolved acute dyspnea secondary to community-acquired pneumonia with concern for recurrent aspiration and large B/L pleural effusions, post bilateral thoracentesis by IR. Speech therapist  evaluated, recs reg consistency and thin fluids with aspiration precautions. Maintain oxygen saturation greater 92% No longer requiring oxygen supplementation post bilateral thoracentesis. Treat underlying conditions Completed 5 days of azithromycin Completed 5 days of Rocephin MRSA screening test positive  Concern for pneumonitis with recurrent aspiration Seen by speech therapist Continue aspiration precautions Palliative care team consulted to assist with establishing goals of care. Completed course of IV antibiotics   Bilateral pleural effusions, s/p bilateral thoracentesis by IR. Personally reviewed chest x-ray showing improvement of pleural effusions. O2 saturation 95% on room air. -Chest x-ray repeated on 1/4 without any significant changes noted  Sacral Pressure wounds, present on admission Continue local wound care with wound care specialist guidance. Continue pain control  Chronic diastolic CHF Continue strict I's and O's and daily weight  Infantile cerebral palsy Functional quadriplegia Muscle relaxers as needed Analgesics as needed  Iron deficiency anemia Hemoglobin 10.6 from 11.8, 2 months ago Monitor H&H  Severe protein calorie malnutrition Continue protein supplementation  Chronic constipation Continue senokot 2 tabs bid, miralax bid Dulcolax sup every other day Smog enema 12/30 Continue bowel regimen Abdomen does appear to be distended today Check KUB We will also check ultrasound for ascites Reportedly had a bowel movement earlier today  Acute urinary retention Insert Foley catheter after many recurrences on 05/25/2021 Continue Flomax -Can consider voiding trial on 1/6  Goals of care DNR Palliative care team consulted to assist with establishing goals of care   Code Status: DNR  Family Communication: None at bedside  Disposition Plan: Likely return to group home.   Consultants: Palliative care  team  Procedures: None  Antimicrobials: Rocephin Azithromycin  DVT prophylaxis: Subcu Lovenox daily  Status is: Inpatient  Patient requires at least 2 midnights for further evaluation and treatment.  Objective: Vitals:   05/30/21 2240 05/31/21 0434 05/31/21 0500 05/31/21 1300  BP: 96/63 (!) 119/58  113/79  Pulse: (!) 107 90  (!) 107  Resp: 14 16  16   Temp: 99.5 F (37.5 C) 98.4 F (36.9 C)  97.7 F (36.5 C)  TempSrc: Oral Oral  Oral  SpO2: 96% 92%  93%  Weight:   96.9 kg     Intake/Output Summary (Last 24 hours) at 05/31/2021 2117 Last data filed at 05/31/2021 1827 Gross per 24 hour  Intake 480 ml  Output 500 ml  Net -20 ml   Filed Weights   05/29/21 0654 05/30/21 0500 05/31/21 0500  Weight: 102.1 kg 97.7 kg 96.9 kg    Exam:  General exam: Alert, awake, oriented x 3 Respiratory system: bilateral rhonchi. Respiratory effort normal. Cardiovascular system:RRR. No murmurs, rubs, gallops. Gastrointestinal system: Abdomen is distended, firm and nontender. No organomegaly or masses felt. Normal bowel sounds heard. Extremities: right lower extremity amputation Skin: pressure wounds on sacrum, present on admission Psychiatry: Judgement and insight appear normal. Mood & affect appropriate.     Data Reviewed: CBC: Recent Labs  Lab 05/26/21 0737 05/27/21 0704 05/29/21 0631 05/31/21 0400  WBC 9.2 10.8* 12.1* 10.2  HGB 8.9* 9.8* 9.7* 9.0*  HCT 27.6* 31.3* 29.1* 27.7*  MCV 73.4* 74.2* 73.1* 73.5*  PLT 564* 713* 731* [id number]*   Basic Metabolic Panel: Recent Labs  Lab 05/25/21 0420 05/26/21 0437 05/27/21 0413 05/29/21 0503 05/29/21 0631 05/31/21 0400  NA 133* 132* 134*  --  133* 133*  K 3.8 4.0 4.0  --  4.0 4.1  CL 102 101 103  --  101 103  CO2 24 26 25   --  27 25  GLUCOSE 87 88 93  --  89 87  BUN 18 18 20   --  18 19  CREATININE 0.35* 0.32* <0.30* 0.36* 0.34* 0.33*  CALCIUM 8.4* 8.6* 8.6*  --  8.6* 8.3*  MG  --   --   --   --  2.2  --     GFR: Estimated Creatinine Clearance: 117 mL/min (A) (by C-G formula based on SCr of 0.33 mg/dL (L)). Liver Function Tests: No results for input(s): AST, ALT, ALKPHOS, BILITOT, PROT, ALBUMIN in the last 168 hours.  No results for input(s): LIPASE, AMYLASE in the last 168 hours. No results for input(s): AMMONIA in the last 168 hours. Coagulation Profile: No results for input(s): INR, PROTIME in the last 168 hours. Cardiac Enzymes: No results for input(s): CKTOTAL, CKMB, CKMBINDEX, TROPONINI in the last 168 hours. BNP (last 3 results) No results for input(s): PROBNP in the last 8760 hours. HbA1C: No results for input(s): HGBA1C in the last 72 hours. CBG: Recent Labs  Lab 05/28/21 2058  GLUCAP 131*   Lipid Profile: No results for input(s): CHOL, HDL, LDLCALC, TRIG, CHOLHDL, LDLDIRECT in the last 72 hours. Thyroid Function Tests: No results for input(s): TSH, T4TOTAL, FREET4, T3FREE, THYROIDAB in the last 72 hours. Anemia Panel: No results for input(s): VITAMINB12, FOLATE, FERRITIN, TIBC, IRON, RETICCTPCT in the last 72 hours. Urine analysis:    Component Value Date/Time   COLORURINE YELLOW (A) 02/20/2021 1432   APPEARANCEUR CLEAR (A) 02/20/2021 1432   LABSPEC >1.046 (H) 02/20/2021 1432   PHURINE 6.0 02/20/2021 1432   GLUCOSEU NEGATIVE 02/20/2021 1432   HGBUR NEGATIVE 02/20/2021 1432   HGBUR negative 06/14/2010 [place] 02/20/2021 [place] 01/04/2016 1634   KETONESUR NEGATIVE 02/20/2021 1432  PROTEINUR 100 (A) 02/20/2021 1432   UROBILINOGEN 2.0 (H) 02/05/2018 1825   NITRITE NEGATIVE 02/20/2021 1432   LEUKOCYTESUR NEGATIVE 02/20/2021 1432   Sepsis Labs: @LABRCNTIP (procalcitonin:4,lacticidven:4)  ) Recent Results (from the past 240 hour(s))  Resp Panel by RT-PCR (Flu A&B, Covid) Nasopharyngeal Swab     Status: None   Collection Time: 05/22/21  1:48 PM   Specimen: Nasopharyngeal Swab; Nasopharyngeal(NP) swabs in vial transport medium   Result Value Ref Range Status   SARS Coronavirus 2 by RT PCR NEGATIVE NEGATIVE Final    Comment: (NOTE) SARS-CoV-2 target nucleic acids are NOT DETECTED.  The SARS-CoV-2 RNA is generally detectable in upper respiratory specimens during the acute phase of infection. The lowest concentration of SARS-CoV-2 viral copies this assay can detect is 138 copies/mL. A negative result does not preclude SARS-Cov-2 infection and should not be used as the sole basis for treatment or other patient management decisions. A negative result may occur with  improper specimen collection/handling, submission of specimen other than nasopharyngeal swab, presence of viral mutation(s) within the areas targeted by this assay, and inadequate number of viral copies(<138 copies/mL). A negative result must be combined with clinical observations, patient history, and epidemiological information. The expected result is Negative.  Fact Sheet for Patients:  EntrepreneurPulse.com.au  Fact Sheet for Healthcare Providers:  IncredibleEmployment.be  This test is no t yet approved or cleared by the [place] FDA and  has been authorized for detection and/or diagnosis of SARS-CoV-2 by FDA under an Emergency Use Authorization (EUA). This EUA will remain  in effect (meaning this test can be used) for the duration of the COVID-19 declaration under Section 564(b)(1) of the Act, 21 U.S.C.section 360bbb-3(b)(1), unless the authorization is terminated  or revoked sooner.       Influenza A by PCR NEGATIVE NEGATIVE Final   Influenza B by PCR NEGATIVE NEGATIVE Final    Comment: (NOTE) The Xpert Xpress SARS-CoV-2/FLU/RSV plus assay is intended as an aid in the diagnosis of influenza from Nasopharyngeal swab specimens and should not be used as a sole basis for treatment. Nasal washings and aspirates are unacceptable for Xpert Xpress SARS-CoV-2/FLU/RSV testing.  Fact Sheet for  Patients: EntrepreneurPulse.com.au  Fact Sheet for Healthcare Providers: IncredibleEmployment.be  This test is not yet approved or cleared by the [place] FDA and has been authorized for detection and/or diagnosis of SARS-CoV-2 by FDA under an Emergency Use Authorization (EUA). This EUA will remain in effect (meaning this test can be used) for the duration of the COVID-19 declaration under Section 564(b)(1) of the Act, 21 U.S.C. section 360bbb-3(b)(1), unless the authorization is terminated or revoked.  Performed at [facility], [place] [address] [place], [place] [address]   MRSA Next Gen by PCR, Nasal     Status: Abnormal   Collection Time: 05/23/21 11:12 AM   Specimen: Nasal Mucosa; Nasal Swab  Result Value Ref Range Status   MRSA by PCR Next Gen DETECTED (A) NOT DETECTED Final    Comment: RESULT CALLED TO, READ BACK BY AND VERIFIED WITH: MELTON,A. RN AT [id number] 05/23/21 MULLINS,T (NOTE) The GeneXpert MRSA Assay (FDA approved for NASAL specimens only), is one component of a comprehensive MRSA colonization surveillance program. It is not intended to diagnose MRSA infection nor to guide or monitor treatment for MRSA infections. Test performance is not FDA approved in patients less than 17 years old. Performed at [facility], [place] [address] [place], [place] [address]   Body fluid culture w Gram  Stain     Status: None   Collection Time: 05/24/21 11:18 AM   Specimen: PATH Cytology Pleural fluid  Result Value Ref Range Status   Specimen Description   Final    PLEURAL Performed at [place] [address], [place], [place] [address]    Special Requests   Final    NONE Performed at [facility], [place] [address], [place], [place] [address]    Gram Stain   Final    RARE WBC PRESENT, PREDOMINANTLY MONONUCLEAR NO ORGANISMS SEEN    Culture   Final    NO GROWTH 3  DAYS Performed at [facility], [place] [address], [place], [place] [address]    Report Status 05/28/2021 FINAL  Final  Expectorated Sputum Assessment w Gram Stain, Rflx to Resp Cult     Status: None   Collection Time: 05/25/21  9:06 AM   Specimen: Expectorated Sputum  Result Value Ref Range Status   Specimen Description EXPECTORATED SPUTUM  Final   Special Requests Normal  Final   Sputum evaluation   Final    THIS SPECIMEN IS ACCEPTABLE FOR SPUTUM CULTURE Performed at [facility], [place] [address], [place], [place] [address]    Report Status 05/25/2021 FINAL  Final  Culture, Respiratory w Gram Stain     Status: None   Collection Time: 05/25/21  9:06 AM  Result Value Ref Range Status   Specimen Description   Final    EXPECTORATED SPUTUM Performed at [facility], [place] [address], [place], [place] [address]    Special Requests   Final    Normal Reflexed from [id number] Performed at [facility], [place] [address] [place], [place] [address]    Gram Stain   Final    RARE WBC PRESENT, PREDOMINANTLY PMN FEW GRAM POSITIVE COCCI IN PAIRS IN CLUSTERS RARE GRAM NEGATIVE RODS    Culture   Final    Normal respiratory flora-no Staph aureus or Pseudomonas seen Performed at [facility], [address], [place], [place] [address]    Report Status 05/27/2021 FINAL  Final  Culture, body fluid w Gram Stain-bottle     Status: None   Collection Time: 05/25/21 12:00 PM   Specimen: Pleura  Result Value Ref Range Status   Specimen Description PLEURAL FLUID LEFT  Final   Special Requests NONE  Final   Culture   Final    NO GROWTH 5 DAYS Performed at [place] [address], [place], [place] [address]    Report Status 05/30/2021 FINAL  Final  Gram stain     Status: None   Collection Time: 05/25/21 12:00 PM   Specimen: Pleura  Result Value Ref Range Status   Specimen Description PLEURAL FLUID LEFT  Final   Special  Requests NONE  Final   Gram Stain   Final    WBC PRESENT,BOTH PMN AND MONONUCLEAR NO ORGANISMS SEEN CYTOSPIN SMEAR Performed at [facility], [address], [place], [place] [address]    Report Status 05/25/2021 FINAL  Final      Studies: DG Abd Portable 1V  Result Date: 05/31/2021 CLINICAL DATA:  Abdominal distension. EXAM: PORTABLE ABDOMEN - 1 VIEW COMPARISON:  CT 02/20/2021 FINDINGS: Rotated exam, best positioning due to patient medical condition per technologist. Moderate stool in the right and transverse colon, also seen on prior CT. No evidence of small bowel dilatation or obstruction. Surgical clips in  the region of the gastroesophageal junction. Gallstones on CT are not seen by radiograph. IMPRESSION: Moderate stool in the right and transverse colon. No evidence of bowel obstruction. Electronically Signed   By: Keith Rake M.D.   On: 05/31/2021 21:14    Scheduled Meds:  (feeding supplement) PROSource Plus  30 mL Oral Daily   vitamin C  250 mg Oral Daily   baclofen  20 mg Oral TID   bisacodyl  10 mg Rectal QODAY   calcium carbonate  3 tablet Oral Q breakfast   chlorhexidine  15 mL Mouth Rinse BID   Chlorhexidine Gluconate Cloth  6 each Topical Daily   cholecalciferol  5,000 Units Oral Daily   clotrimazole  1 application Topical BID   diclofenac Sodium  4 g Topical QID   divalproex  500 mg Oral BID   enoxaparin (LOVENOX) injection  40 mg Subcutaneous Q24H   feeding supplement  237 mL Oral BID BM   fluticasone  1 spray Each Nare Daily   hydrocortisone cream  1 application Topical BID   ipratropium-albuterol  3 mL Nebulization BID   iron polysaccharides  150 mg Oral Q1200   ketoconazole  1 application Topical Once per day on Mon Thu   lactulose  15 g Oral Daily   lisinopril  2.5 mg Oral Daily   loratadine  10 mg Oral Daily   mouth rinse  15 mL Mouth Rinse BID   pantoprazole  40 mg Oral Daily   polyethylene glycol  17 g Oral BID   risperiDONE  2 mg Oral QHS    senna-docusate  2 tablet Oral BID   tamsulosin  0.4 mg Oral QPC supper    Continuous Infusions:     LOS: 8 days     Kathie Dike, MD [facility]   If 7PM-7AM, please contact night-coverage [email or web address]  05/31/2021, 9:17 PM

## 2021-06-01 ENCOUNTER — Inpatient Hospital Stay (HOSPITAL_COMMUNITY): Payer: Medicare Other

## 2021-06-01 DIAGNOSIS — R0600 Dyspnea, unspecified: Secondary | ICD-10-CM | POA: Diagnosis not present

## 2021-06-01 DIAGNOSIS — I5032 Chronic diastolic (congestive) heart failure: Secondary | ICD-10-CM | POA: Diagnosis not present

## 2021-06-01 DIAGNOSIS — J189 Pneumonia, unspecified organism: Secondary | ICD-10-CM | POA: Diagnosis not present

## 2021-06-01 DIAGNOSIS — R509 Fever, unspecified: Secondary | ICD-10-CM | POA: Diagnosis not present

## 2021-06-01 LAB — CBC
HCT: 29.6 % — ABNORMAL LOW (ref 39.0–52.0)
Hemoglobin: 9.1 g/dL — ABNORMAL LOW (ref 13.0–17.0)
MCH: 23.3 pg — ABNORMAL LOW (ref 26.0–34.0)
MCHC: 30.7 g/dL (ref 30.0–36.0)
MCV: 75.7 fL — ABNORMAL LOW (ref 80.0–100.0)
Platelets: 782 10*3/uL — ABNORMAL HIGH (ref 150–400)
RBC: 3.91 MIL/uL — ABNORMAL LOW (ref 4.22–5.81)
RDW: 19.2 % — ABNORMAL HIGH (ref 11.5–15.5)
WBC: 10.6 10*3/uL — ABNORMAL HIGH (ref 4.0–10.5)
nRBC: 0 % (ref 0.0–0.2)

## 2021-06-01 LAB — COMPREHENSIVE METABOLIC PANEL
ALT: 22 U/L (ref 0–44)
AST: 22 U/L (ref 15–41)
Albumin: 2 g/dL — ABNORMAL LOW (ref 3.5–5.0)
Alkaline Phosphatase: 159 U/L — ABNORMAL HIGH (ref 38–126)
Anion gap: 10 (ref 5–15)
BUN: 20 mg/dL (ref 8–23)
CO2: 26 mmol/L (ref 22–32)
Calcium: 8.8 mg/dL — ABNORMAL LOW (ref 8.9–10.3)
Chloride: 100 mmol/L (ref 98–111)
Creatinine, Ser: 0.42 mg/dL — ABNORMAL LOW (ref 0.61–1.24)
GFR, Estimated: >60 mL/min (ref >60)
Glucose, Bld: 94 mg/dL (ref 70–99)
Potassium: 4.4 mmol/L (ref 3.5–5.1)
Sodium: 136 mmol/L (ref 135–145)
Total Bilirubin: 0.3 mg/dL (ref 0.3–1.2)
Total Protein: 6.2 g/dL — ABNORMAL LOW (ref 6.5–8.1)

## 2021-06-01 LAB — CYTOLOGY - NON PAP

## 2021-06-01 MED ORDER — LINACLOTIDE 145 MCG PO CAPS
145.0000 ug | ORAL_CAPSULE | Freq: Every day | ORAL | Status: DC
  Administered 2021-06-02 – 2021-06-03 (×2): 145 ug via ORAL
  Filled 2021-06-01 (×3): qty 1

## 2021-06-01 MED ORDER — SORBITOL 70 % SOLN
960.0000 mL | TOPICAL_OIL | Freq: Once | ORAL | Status: AC
  Administered 2021-06-01: 960 mL via RECTAL
  Filled 2021-06-01 (×2): qty 473

## 2021-06-01 MED ORDER — MORPHINE SULFATE ER 15 MG PO TBCR
15.0000 mg | EXTENDED_RELEASE_TABLET | Freq: Two times a day (BID) | ORAL | Status: DC
  Administered 2021-06-01 – 2021-06-03 (×4): 15 mg via ORAL
  Filled 2021-06-01 (×4): qty 1

## 2021-06-01 NOTE — Procedures (Signed)
Patient presented to radiology for paracentesis today.   Abdomen visualized with ultrasound.  No fluid collection for safe approach found.  Ultrasound images saved for documentation purpose.   Paracentesis was NOT performed.   Armando Gang Aimi Essner PA-C 06/01/2021 1:14 PM

## 2021-06-01 NOTE — Progress Notes (Signed)
PROGRESS NOTE  [name] [id number] DOB: 03/13/1961 DOA: 05/22/2021 PCP: Audley Hose, MD  HPI/Recap of past 24 hours:  [name] is a 61 y.o. male with medical history significant of chronic diastolic CHF, right foot amputee, iron deficiency anemia, anxiety, bipolar disorder, intellectual functional disability, obsessive-compulsive disorder, cerebral palsy, chronic bronchitis, constipation, environmental allergies, gastric ulcer with history of UGI bleed necessitating blood transfusion, GERD, hiatal hernia, history of varicella-zoster, hyperlipidemia, osteomyelitis of right foot, history of pneumonia, pressure ulcer of sacral area, history of Pseudomonas infection, recurrent UTIs, seborrheic dermatitis, vitamin D deficiency, functional quadriplegia who was brought from his group home due to productive cough for about a week.  Work up revealed community-acquired pneumonia with concern for possible aspiration, also revealed large B/L pleural effusions.  He was started on Rocephin and azithromycin empirically.  Post R thoracentesis by IR on 12/29 and post L thoracentesis by IR on 12/30, highly appreciated.  05/31/2021: had small bowel movement yesterday. Continues to have abdominal discomfort    Assessment/Plan: Principal Problem:   Acute dyspnea Active Problems:   Infantile cerebral palsy (HCC)   Pressure ulcer of foot   Functional quadriplegia (HCC)   Stage 3 skin ulcer of sacral region (HCC)   Microcytic anemia   Lactic acidosis   Iron deficiency anemia   Protein-calorie malnutrition, severe   Chronic diastolic CHF (congestive heart failure) (HCC)   Malnutrition of moderate degree  Resolved acute dyspnea secondary to community-acquired pneumonia with concern for recurrent aspiration and large B/L pleural effusions, post bilateral thoracentesis by IR. Speech therapist evaluated, recs reg consistency and thin fluids with aspiration precautions. Maintain oxygen  saturation greater 92% No longer requiring oxygen supplementation post bilateral thoracentesis. Treat underlying conditions Completed 5 days of azithromycin Completed 5 days of Rocephin MRSA screening test positive  Concern for pneumonitis with recurrent aspiration Seen by speech therapist Continue aspiration precautions Palliative care team consulted to assist with establishing goals of care. Completed course of IV antibiotics   Bilateral pleural effusions, s/p bilateral thoracentesis by IR. Personally reviewed chest x-ray showing improvement of pleural effusions. O2 saturation 95% on room air. -Chest x-ray repeated on 1/4 without any significant changes noted  Sacral Pressure wounds, present on admission Continue local wound care with wound care specialist guidance. Continue pain control  Chronic diastolic CHF Continue strict I's and O's and daily weight  Infantile cerebral palsy Functional quadriplegia Muscle relaxers as needed Analgesics as needed  Iron deficiency anemia Hemoglobin 10.6 from 11.8, 2 months ago Monitor H&H  Severe protein calorie malnutrition Continue protein supplementation  Chronic constipation Continue senokot 2 tabs bid, miralax bid Dulcolax sup every other day Smog enema 12/30 Continue bowel regimen Abdomen does appear to be distended today KUB shows persistent stool burden Received enema today  Chronic indwelling foley catheter Facility confirms that patient has a chronic foley catheter Continue Flomax   Goals of care DNR Palliative care team consulted to assist with establishing goals of care   Code Status: DNR  Family Communication: None at bedside  Disposition Plan: Likely return to group home.   Consultants: Palliative care team  Procedures: None  Antimicrobials: Rocephin Azithromycin  DVT prophylaxis: Subcu Lovenox daily  Status is: Inpatient  Patient requires at least 2 midnights for further evaluation and  treatment.      Objective: Vitals:   06/01/21 0429 06/01/21 1359 06/01/21 2051 06/01/21 2213  BP: 119/68 117/88  128/82  Pulse: 88 (!) 103  94  Resp: 16 20  Temp: (!) 97.5 F (36.4 C) 98.8 F (37.1 C)  98.3 F (36.8 C)  TempSrc:  Oral  Oral  SpO2: 97% 95% 96% 91%  Weight:        Intake/Output Summary (Last 24 hours) at 06/01/2021 2301 Last data filed at 06/01/2021 2223 Gross per 24 hour  Intake 2278.62 ml  Output 975 ml  Net 1303.62 ml   Filed Weights   05/29/21 0654 05/30/21 0500 05/31/21 0500  Weight: 102.1 kg 97.7 kg 96.9 kg    Exam:  General exam: Alert, awake, oriented x 3 Respiratory system: bilateral rhonchi. Respiratory effort normal. Cardiovascular system:RRR. No murmurs, rubs, gallops. Gastrointestinal system: Abdomen is distended, firm and nontender. No organomegaly or masses felt. Normal bowel sounds heard. Extremities: right lower extremity amputation Skin: pressure wounds on sacrum, present on admission Psychiatry: Judgement and insight appear normal. Mood & affect appropriate.     Data Reviewed: CBC: Recent Labs  Lab 05/26/21 0737 05/27/21 0704 05/29/21 0631 05/31/21 0400 06/01/21 0404  WBC 9.2 10.8* 12.1* 10.2 10.6*  HGB 8.9* 9.8* 9.7* 9.0* 9.1*  HCT 27.6* 31.3* 29.1* 27.7* 29.6*  MCV 73.4* 74.2* 73.1* 73.5* 75.7*  PLT 564* 713* 731* 780* 0000000*   Basic Metabolic Panel: Recent Labs  Lab 05/26/21 0437 05/27/21 0413 05/29/21 0503 05/29/21 0631 05/31/21 0400 06/01/21 0404  NA 132* 134*  --  133* 133* 136  K 4.0 4.0  --  4.0 4.1 4.4  CL 101 103  --  101 103 100  CO2 26 25  --  27 25 26   GLUCOSE 88 93  --  89 87 94  BUN 18 20  --  18 19 20   CREATININE 0.32* <0.30* 0.36* 0.34* 0.33* 0.42*  CALCIUM 8.6* 8.6*  --  8.6* 8.3* 8.8*  MG  --   --   --  2.2  --   --    GFR: Estimated Creatinine Clearance: 117 mL/min (A) (by C-G formula based on SCr of 0.42 mg/dL (L)). Liver Function Tests: Recent Labs  Lab 06/01/21 0404  AST 22  ALT 22   ALKPHOS 159*  BILITOT 0.3  PROT 6.2*  ALBUMIN 2.0*    No results for input(s): LIPASE, AMYLASE in the last 168 hours. No results for input(s): AMMONIA in the last 168 hours. Coagulation Profile: No results for input(s): INR, PROTIME in the last 168 hours. Cardiac Enzymes: No results for input(s): CKTOTAL, CKMB, CKMBINDEX, TROPONINI in the last 168 hours. BNP (last 3 results) No results for input(s): PROBNP in the last 8760 hours. HbA1C: No results for input(s): HGBA1C in the last 72 hours. CBG: Recent Labs  Lab 05/28/21 2058  GLUCAP 131*   Lipid Profile: No results for input(s): CHOL, HDL, LDLCALC, TRIG, CHOLHDL, LDLDIRECT in the last 72 hours. Thyroid Function Tests: No results for input(s): TSH, T4TOTAL, FREET4, T3FREE, THYROIDAB in the last 72 hours. Anemia Panel: No results for input(s): VITAMINB12, FOLATE, FERRITIN, TIBC, IRON, RETICCTPCT in the last 72 hours. Urine analysis:    Component Value Date/Time   COLORURINE YELLOW (A) 02/20/2021 1432   APPEARANCEUR CLEAR (A) 02/20/2021 1432   LABSPEC >1.046 (H) 02/20/2021 1432   PHURINE 6.0 02/20/2021 1432   GLUCOSEU NEGATIVE 02/20/2021 1432   HGBUR NEGATIVE 02/20/2021 1432   HGBUR negative 06/14/2010 1119   BILIRUBINUR NEGATIVE 02/20/2021 1432   BILIRUBINUR SMALL 01/04/2016 1634   KETONESUR NEGATIVE 02/20/2021 1432   PROTEINUR 100 (A) 02/20/2021 1432   UROBILINOGEN 2.0 (H) 02/05/2018 1825   NITRITE NEGATIVE  02/20/2021 1432   LEUKOCYTESUR NEGATIVE 02/20/2021 1432   Sepsis Labs: @LABRCNTIP (procalcitonin:4,lacticidven:4)  ) Recent Results (from the past 240 hour(s))  MRSA Next Gen by PCR, Nasal     Status: Abnormal   Collection Time: 05/23/21 11:12 AM   Specimen: Nasal Mucosa; Nasal Swab  Result Value Ref Range Status   MRSA by PCR Next Gen DETECTED (A) NOT DETECTED Final    Comment: RESULT CALLED TO, READ BACK BY AND VERIFIED WITH: MELTON,A. RN AT [id number] 05/23/21 MULLINS,T (NOTE) The GeneXpert MRSA Assay (FDA  approved for NASAL specimens only), is one component of a comprehensive MRSA colonization surveillance program. It is not intended to diagnose MRSA infection nor to guide or monitor treatment for MRSA infections. Test performance is not FDA approved in patients less than 52 years old. Performed at [facility], [place] [address] [place], [place] [address]   Body fluid culture w Gram Stain     Status: None   Collection Time: 05/24/21 11:18 AM   Specimen: PATH Cytology Pleural fluid  Result Value Ref Range Status   Specimen Description   Final    PLEURAL Performed at [place] [address], [place], [place] [address]    Special Requests   Final    NONE Performed at [facility], [place] [address] [place], [place] [address]    Gram Stain   Final    RARE WBC PRESENT, PREDOMINANTLY MONONUCLEAR NO ORGANISMS SEEN    Culture   Final    NO GROWTH 3 DAYS Performed at [facility], [place] [address] [place], [place] [address]    Report Status 05/28/2021 FINAL  Final  Expectorated Sputum Assessment w Gram Stain, Rflx to Resp Cult     Status: None   Collection Time: 05/25/21  9:06 AM   Specimen: Expectorated Sputum  Result Value Ref Range Status   Specimen Description EXPECTORATED SPUTUM  Final   Special Requests Normal  Final   Sputum evaluation   Final    THIS SPECIMEN IS ACCEPTABLE FOR SPUTUM CULTURE Performed at [facility], [place] [address] [place], [place] [address]    Report Status 05/25/2021 FINAL  Final  Culture, Respiratory w Gram Stain     Status: None   Collection Time: 05/25/21  9:06 AM  Result Value Ref Range Status   Specimen Description   Final    EXPECTORATED SPUTUM Performed at [facility], [place] [address], [place], [place] [address]    Special Requests   Final    Normal Reflexed from [id number] Performed at [facility], [place] [address],  [place], [place] [address]    Gram Stain   Final    RARE WBC PRESENT, PREDOMINANTLY PMN FEW GRAM POSITIVE COCCI IN PAIRS IN CLUSTERS RARE GRAM NEGATIVE RODS    Culture   Final    Normal respiratory flora-no Staph aureus or Pseudomonas seen Performed at [facility], [address], [place], [place] [address]    Report Status 05/27/2021 FINAL  Final  Culture, body fluid w Gram Stain-bottle     Status: None   Collection Time: 05/25/21 12:00 PM   Specimen: Pleura  Result Value Ref Range Status   Specimen Description PLEURAL FLUID LEFT  Final   Special Requests NONE  Final   Culture   Final    NO GROWTH 5 DAYS Performed at [place] [address], [place], [place] [address]  Report Status 05/30/2021 FINAL  Final  Gram stain     Status: None   Collection Time: 05/25/21 12:00 PM   Specimen: Pleura  Result Value Ref Range Status   Specimen Description PLEURAL FLUID LEFT  Final   Special Requests NONE  Final   Gram Stain   Final    WBC PRESENT,BOTH PMN AND MONONUCLEAR NO ORGANISMS SEEN CYTOSPIN SMEAR Performed at [facility], [address], [place], [place] [address]    Report Status 05/25/2021 FINAL  Final      Studies: [place] ASCITES (ABDOMEN LIMITED)  Result Date: 06/01/2021 CLINICAL DATA:  62 year old male referred for possible paracentesis EXAM: LIMITED ABDOMEN ULTRASOUND FOR ASCITES TECHNIQUE: Limited ultrasound survey for ascites was performed in all four abdominal quadrants. COMPARISON:  None. FINDINGS: Limited ultrasound performed in 4 quadrants of the abdomen demonstrating scant ascites. Paracentesis not performed at this time IMPRESSION: Limited ultrasound demonstrates scant ascites Electronically Signed   By: Corrie Mckusick D.O.   On: 06/01/2021 17:52    Scheduled Meds:  (feeding supplement) PROSource Plus  30 mL Oral Daily   vitamin C  250 mg Oral Daily   baclofen  20 mg Oral TID   bisacodyl  10 mg Rectal QODAY   calcium carbonate  3 tablet Oral Q  breakfast   chlorhexidine  15 mL Mouth Rinse BID   Chlorhexidine Gluconate Cloth  6 each Topical Daily   cholecalciferol  5,000 Units Oral Daily   clotrimazole  1 application Topical BID   diclofenac Sodium  4 g Topical QID   divalproex  500 mg Oral BID   enoxaparin (LOVENOX) injection  40 mg Subcutaneous Q24H   feeding supplement  237 mL Oral BID BM   fluticasone  1 spray Each Nare Daily   hydrocortisone cream  1 application Topical BID   ipratropium-albuterol  3 mL Nebulization BID   iron polysaccharides  150 mg Oral Q1200   ketoconazole  1 application Topical Once per day on Mon Thu   lactulose  15 g Oral Daily   lisinopril  2.5 mg Oral Daily   loratadine  10 mg Oral Daily   mouth rinse  15 mL Mouth Rinse BID   morphine  15 mg Oral Q12H   pantoprazole  40 mg Oral Daily   polyethylene glycol  17 g Oral BID   risperiDONE  2 mg Oral QHS   senna-docusate  2 tablet Oral BID   tamsulosin  0.4 mg Oral QPC supper    Continuous Infusions:  lactated ringers 75 mL/hr at 06/01/21 2223      LOS: 9 days     Kathie Dike, MD [facility]   If 7PM-7AM, please contact night-coverage [email or web address]  06/01/2021, 11:01 PM

## 2021-06-01 NOTE — Progress Notes (Signed)
[facility] ([facility])    [name] is our current outpatient palliative pt.   [facility] will continue to follow while hospitalized and resume outpatient palliative services once he is back in his group home  If you have questions or need assistance, please call [phone] or contact the hospital Liaison listed on AMION.      Farrel Gordon, RN, [facility] Liaison

## 2021-06-02 DIAGNOSIS — E440 Moderate protein-calorie malnutrition: Secondary | ICD-10-CM | POA: Diagnosis not present

## 2021-06-02 DIAGNOSIS — Z515 Encounter for palliative care: Secondary | ICD-10-CM | POA: Diagnosis not present

## 2021-06-02 DIAGNOSIS — R0600 Dyspnea, unspecified: Secondary | ICD-10-CM | POA: Diagnosis not present

## 2021-06-02 DIAGNOSIS — E43 Unspecified severe protein-calorie malnutrition: Secondary | ICD-10-CM | POA: Diagnosis not present

## 2021-06-02 LAB — BASIC METABOLIC PANEL
Anion gap: 7 (ref 5–15)
BUN: 14 mg/dL (ref 8–23)
CO2: 26 mmol/L (ref 22–32)
Calcium: 8.5 mg/dL — ABNORMAL LOW (ref 8.9–10.3)
Chloride: 99 mmol/L (ref 98–111)
Creatinine, Ser: 0.3 mg/dL — ABNORMAL LOW (ref 0.61–1.24)
GFR, Estimated: >60 mL/min (ref >60)
Glucose, Bld: 86 mg/dL (ref 70–99)
Potassium: 4.6 mmol/L (ref 3.5–5.1)
Sodium: 132 mmol/L — ABNORMAL LOW (ref 135–145)

## 2021-06-02 LAB — CBC
HCT: 29.9 % — ABNORMAL LOW (ref 39.0–52.0)
Hemoglobin: 9.4 g/dL — ABNORMAL LOW (ref 13.0–17.0)
MCH: 23.9 pg — ABNORMAL LOW (ref 26.0–34.0)
MCHC: 31.4 g/dL (ref 30.0–36.0)
MCV: 76.1 fL — ABNORMAL LOW (ref 80.0–100.0)
Platelets: 847 10*3/uL — ABNORMAL HIGH (ref 150–400)
RBC: 3.93 MIL/uL — ABNORMAL LOW (ref 4.22–5.81)
RDW: 19.2 % — ABNORMAL HIGH (ref 11.5–15.5)
WBC: 9.4 10*3/uL (ref 4.0–10.5)
nRBC: 0 % (ref 0.0–0.2)

## 2021-06-02 NOTE — Progress Notes (Signed)
Daily Progress Note   Patient Name: [name]       Date: 06/02/2021 DOB: 05/19/1961  Age: 61 y.o. MRN#: [id number] Attending Physician: Kathie Dike, MD Primary Care Physician: Audley Hose, MD Admit Date: 05/22/2021  Reason for Consultation/Follow-up: Establishing goals of care  Subjective:  Resting in bed, no distress, awakens easily and interacts, complains of ongoing pain and discomfort in his back from his "sores"  Length of Stay: 10  Current Medications: Scheduled Meds:   (feeding supplement) PROSource Plus  30 mL Oral Daily   vitamin C  250 mg Oral Daily   baclofen  20 mg Oral TID   bisacodyl  10 mg Rectal QODAY   calcium carbonate  3 tablet Oral Q breakfast   chlorhexidine  15 mL Mouth Rinse BID   Chlorhexidine Gluconate Cloth  6 each Topical Daily   cholecalciferol  5,000 Units Oral Daily   clotrimazole  1 application Topical BID   diclofenac Sodium  4 g Topical QID   divalproex  500 mg Oral BID   enoxaparin (LOVENOX) injection  40 mg Subcutaneous Q24H   feeding supplement  237 mL Oral BID BM   fluticasone  1 spray Each Nare Daily   hydrocortisone cream  1 application Topical BID   ipratropium-albuterol  3 mL Nebulization BID   iron polysaccharides  150 mg Oral Q1200   ketoconazole  1 application Topical Once per day on Mon Thu   lactulose  15 g Oral Daily   linaclotide  145 mcg Oral QAC breakfast   lisinopril  2.5 mg Oral Daily   loratadine  10 mg Oral Daily   mouth rinse  15 mL Mouth Rinse BID   morphine  15 mg Oral Q12H   pantoprazole  40 mg Oral Daily   polyethylene glycol  17 g Oral BID   risperiDONE  2 mg Oral QHS   senna-docusate  2 tablet Oral BID   tamsulosin  0.4 mg Oral QPC supper    Continuous Infusions:  lactated ringers 75 mL/hr at  06/02/21 0410    PRN Meds: acetaminophen **OR** acetaminophen, albuterol, alum & mag hydroxide-simeth, bisacodyl, guaiFENesin-dextromethorphan, HYDROmorphone (DILAUDID) injection, liver oil-zinc oxide, LORazepam, ondansetron **OR** ondansetron (ZOFRAN) IV, oxyCODONE  Physical Exam         Resting in bed No  distress S 1 S 2  Regular work of breathing Regular work of breathing Wound care notes reviewed.   Vital Signs: BP 124/69 (BP Location: Right Wrist)    Pulse 78    Temp 98.2 F (36.8 C) (Axillary)    Resp (!) 9    Wt 96.9 kg    SpO2 90%    BMI 28.97 kg/m  SpO2: SpO2: 90 % O2 Device: O2 Device: Room Air O2 Flow Rate: O2 Flow Rate (L/min): 3 L/min  Intake/output summary:  Intake/Output Summary (Last 24 hours) at 06/02/2021 1101 Last data filed at 06/02/2021 0810 Gross per 24 hour  Intake 1518.62 ml  Output 920 ml  Net 598.62 ml   LBM: Last BM Date: 06/02/21 Baseline Weight: Weight: 59.8 kg Most recent weight: Weight: 96.9 kg       Palliative Assessment/Data:      Patient Active Problem List   Diagnosis Date Noted   Malnutrition of moderate degree 05/31/2021   Acute dyspnea 05/22/2021   Acute on chronic diastolic CHF (congestive heart failure) ([place]) 05/22/2021   Chronic diastolic CHF (congestive heart failure) ([place]) 05/22/2021   Protein-calorie malnutrition, severe 02/28/2021   Iron deficiency anemia 02/20/2021   Obsessive compulsive disorder    Cellulitis of sacral region 12/06/2020   Right pulmonary infiltrate on CXR 10/03/2020   Dysphagia 10/03/2020   GERD without esophagitis 10/03/2020   Aspiration pneumonia ([place]) 10/03/2020   Recurrent right pleural effusion 10/02/2020   Abnormal CT scan, colon    Hx of right BKA ([place]) 09/05/2020   Nausea and vomiting 07/31/2020   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 ([place]) 10/08/2019   Osteomyelitis ([place]) 10/08/2019   Acute osteomyelitis of right ankle or foot ([place])    Ulcerated, foot, right, with necrosis of bone (HCC)     Pseudomonas aeruginosa infection 09/06/2019   MRSA infection 09/06/2019   Lactic acidosis    Stage 3 skin ulcer of sacral region ([place]) 08/10/2018   Gastric wall thickening 08/10/2018   Alkaline phosphatase elevation 08/10/2018   Cholelithiasis 08/10/2018   Microcytic anemia    Hiatal hernia 04/07/2018   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 ([place]) 01/20/2016   Functional quadriplegia ([place]) 12/15/2015   Cerebral palsy, quadriplegic ([place])    Pre-diabetes 08/31/2015   Foot ulcer, limited to breakdown of skin ([place]) 06/08/2015   Seasonal allergies 11/01/2014   Pressure ulcer of foot 03/14/2014   CAP (community acquired pneumonia) 03/13/2014   Neuropathy 01/13/2013   Decubitus ulcer of left hip, stage 3 ([place]) 01/08/2013   URINARY INCONTINENCE 02/09/2010   Intellectual disability 07/24/2006   Infantile cerebral palsy ([place]) 07/24/2006    Palliative Care Assessment & Plan   Patient Profile:    Assessment:  61 year old gentleman from a group home, history of chronic diastolic congestive heart failure, history of right foot amputation iron deficiency anemia anxiety bipolar disorder, OCD, cerebral palsy, chronic bronchitis constipation allergies, history of gastric ulcer history of GI bleed, GERD hiatal hernia dyslipidemia history of pressure ulcer of sacral area, history of Pseudomonas infection and recurrent urinary tract infections.  Patient has functional quadriplegia.  Brought in from his group home due to productive cough, work-up revealed community-acquired pneumonia with concern for possible aspiration as well as large bilateral pleural effusions.  Patient admitted to hospital medicine service, started on antibiotics, also underwent a right thoracenteses and a left thoracenteses. Ongoing issues this hospitalization include concern for recurrent aspiration, speech therapist have been following and they have recommended for only  comfort feeds.    Recommendations/Plan: Patient has chronic pain from his sacral pressure wounds, continue current pain management.  Monitor oral intake, seen by speech therapist, continue aspiration precautions. Recommend discharge back to group home with continuation of palliative services towards the end of this hospitalization. Discussed with Dr Roderic Palau on 06-01-21, chart reviewed, medications noted. Patient has been started on Ms Contin PO BID.     Code Status:    Code Status Orders  (From admission, onward)           Start     Ordered   05/22/21 1709  Do not attempt resuscitation (DNR)  Continuous       Question Answer Comment  In the event of cardiac or respiratory ARREST Do not call a code blue   In the event of cardiac or respiratory ARREST Do not perform Intubation, CPR, defibrillation or ACLS   In the event of cardiac or respiratory ARREST Use medication by any route, position, wound care, and other measures to relive pain and suffering. May use oxygen, suction and manual treatment of airway obstruction as needed for comfort.      05/22/21 1711           Code Status History     Date Active Date Inactive Code Status Order ID Comments User Context   05/22/2021 1604 05/22/2021 1711 Full Code [id number]  [name], MD ED   02/21/2021 0648 03/03/2021 2347 Full Code [id number]  Ivor Costa, MD ED   02/20/2021 1801 02/21/2021 0648 DNR [id number]  Ivor Costa, MD ED   12/06/2020 2258 12/16/2020 0713 Full Code [id number]  Tacey Ruiz, MD Inpatient   10/03/2020 0055 10/06/2020 2346 DNR [id number]  Vernelle Emerald, MD ED   10/03/2020 0055 10/03/2020 0055 DNR [id number]  Vernelle Emerald, MD ED   09/05/2020 1039 09/07/2020 2311 DNR [id number]  Terrilee Croak, MD Inpatient   09/05/2020 0258 09/05/2020 1038 Full Code [id number]  Dwyane Dee, MD ED   07/31/2020 1758 08/04/2020 0323 Full Code [id number]  Bonnielee Haff, MD ED   10/08/2019 1815 10/09/2019 2042 Full Code [id number]  Cherylann Ratel A, DO  Inpatient   04/08/2019 1316 04/12/2019 2132 Full Code [id number]  [name], DO ED   08/10/2018 1642 08/15/2018 1737 DNR [id number]  Annita Brod, MD Inpatient   01/14/2016 0946 01/18/2016 2226 DNR [id number]  Dory Horn, NP Inpatient   01/08/2016 1522 01/14/2016 0946 Full Code [id number]  Smiley Houseman, MD Inpatient   10/19/2015 2054 10/22/2015 1646 DNR [id number]  Aquilla Hacker, MD Inpatient   12/14/2014 1236 12/15/2014 1710 Full Code [id number]  Newt Minion, MD Inpatient   10/16/2014 1036 10/20/2014 1813 Full Code [id number]  Leone Brand, MD Inpatient   03/13/2014 0323 03/14/2014 2132 Full Code [id number]  Hilton Sinclair, MD Inpatient       Prognosis:  Unable to determine  Discharge Planning: Group home with palliative.   Care plan was discussed with  IDT  Thank you for allowing the Palliative Medicine Team to assist in the care of this patient.   Time In:  10 Time Out: 10.25 Total Time 25 Prolonged Time Billed No       Greater than 50%  of this time was spent counseling and coordinating care related to the above assessment and plan.  Loistine Chance, MD  Please contact Palliative Medicine Team phone at [phone] for questions and concerns.

## 2021-06-02 NOTE — Progress Notes (Signed)
PROGRESS NOTE  [name] [id number] DOB: [birth year] DOA: 05/22/2021 PCP: Audley Hose, MD  HPI/Recap of past 24 hours:  [name] is a 61 y.o. male with medical history significant of chronic diastolic CHF, right foot amputee, iron deficiency anemia, anxiety, bipolar disorder, intellectual functional disability, obsessive-compulsive disorder, cerebral palsy, chronic bronchitis, constipation, environmental allergies, gastric ulcer with history of UGI bleed necessitating blood transfusion, GERD, hiatal hernia, history of varicella-zoster, hyperlipidemia, osteomyelitis of right foot, history of pneumonia, pressure ulcer of sacral area, history of Pseudomonas infection, recurrent UTIs, seborrheic dermatitis, vitamin D deficiency, functional quadriplegia who was brought from his group home due to productive cough for about a week.  Work up revealed community-acquired pneumonia with concern for possible aspiration, also revealed large B/L pleural effusions.  He was started on Rocephin and azithromycin empirically.  Post R thoracentesis by IR on 12/29 and post L thoracentesis by IR on 12/30, highly appreciated.  06/02/2021: He has had multiple bowel movements today.  Overall abdomen is less distended.  Continues to have pain in sacral area from wound.    Assessment/Plan: Principal Problem:   Acute dyspnea Active Problems:   Infantile cerebral palsy (HCC)   Pressure ulcer of foot   Functional quadriplegia (HCC)   Stage 3 skin ulcer of sacral region (HCC)   Microcytic anemia   Lactic acidosis   Iron deficiency anemia   Protein-calorie malnutrition, severe   Chronic diastolic CHF (congestive heart failure) (HCC)   Malnutrition of moderate degree  Resolved acute dyspnea secondary to community-acquired pneumonia with concern for recurrent aspiration and large B/L pleural effusions, post bilateral thoracentesis by IR. Speech therapist evaluated, recs reg consistency and thin  fluids with aspiration precautions. Maintain oxygen saturation greater 92% No longer requiring oxygen supplementation post bilateral thoracentesis. Treat underlying conditions Completed 5 days of azithromycin Completed 5 days of Rocephin MRSA screening test positive  Concern for pneumonitis with recurrent aspiration Seen by speech therapist Continue aspiration precautions Palliative care team consulted to assist with establishing goals of care. Completed course of IV antibiotics   Bilateral pleural effusions, s/p bilateral thoracentesis by IR. Personally reviewed chest x-ray showing improvement of pleural effusions. O2 saturation 95% on room air. -Chest x-ray repeated on 1/4 without any significant changes noted  Sacral Pressure wounds, present on admission Continue local wound care with wound care specialist guidance. Wound currently appears chronic and does not appear to have active infection at this time Continue pain control Started on MS Contin per palliative  Chronic diastolic CHF Continue strict I's and O's and daily weight  Infantile cerebral palsy Functional quadriplegia Muscle relaxers as needed Analgesics as needed  Iron deficiency anemia Hemoglobin 10.6 from 11.8, 2 months ago Monitor H&H  Severe protein calorie malnutrition Continue protein supplementation  Chronic constipation Continue senokot 2 tabs bid, miralax bid Dulcolax sup every other day Smog enema 12/30 Continue bowel regimen Abdomen does appear to be distended today KUB shows persistent stool burden He has received multiple enemas and suppositories -Also started on Linzess He has had multiple bowel movements today with improvement of abdominal distention  Chronic indwelling foley catheter Facility confirms that patient has a chronic foley catheter Continue Flomax   Goals of care DNR Palliative care team consulted to assist with establishing goals of care   Code Status:  DNR  Family Communication: None at bedside  Disposition Plan: Likely return to group home.   Consultants: Palliative care team  Procedures: None  Antimicrobials: Rocephin Azithromycin  DVT  prophylaxis: Subcu Lovenox daily  Status is: Inpatient  Patient requires at least 2 midnights for further evaluation and treatment.      Objective: Vitals:   06/02/21 0000 06/02/21 0700 06/02/21 0915 06/02/21 1935  BP:  124/69    Pulse:  78    Resp: (!) 9     Temp:  98.2 F (36.8 C)    TempSrc:  Axillary    SpO2:  98% 90% 91%  Weight:        Intake/Output Summary (Last 24 hours) at 06/02/2021 2019 Last data filed at 06/02/2021 1705 Gross per 24 hour  Intake 1828.62 ml  Output 820 ml  Net 1008.62 ml   Filed Weights   05/29/21 0654 05/30/21 0500 05/31/21 0500  Weight: 102.1 kg 97.7 kg 96.9 kg    Exam:  General exam: Alert, awake, oriented x 3 Respiratory system: bilateral rhonchi. Respiratory effort normal. Cardiovascular system:RRR. No murmurs, rubs, gallops. Gastrointestinal system: Abdomen is less distended and softer today and nontender. No organomegaly or masses felt. Normal bowel sounds heard. Extremities: right lower extremity amputation Skin: pressure wounds on sacrum, present on admission Psychiatry: Judgement and insight appear normal. Mood & affect appropriate.      Data Reviewed: CBC: Recent Labs  Lab 05/27/21 0704 05/29/21 0631 05/31/21 0400 06/01/21 0404 06/02/21 0402  WBC 10.8* 12.1* 10.2 10.6* 9.4  HGB 9.8* 9.7* 9.0* 9.1* 9.4*  HCT 31.3* 29.1* 27.7* 29.6* 29.9*  MCV 74.2* 73.1* 73.5* 75.7* 76.1*  PLT 713* 731* 780* 782* 0000000*   Basic Metabolic Panel: Recent Labs  Lab 05/27/21 0413 05/29/21 0503 05/29/21 0631 05/31/21 0400 06/01/21 0404 06/02/21 0402  NA 134*  --  133* 133* 136 132*  K 4.0  --  4.0 4.1 4.4 4.6  CL 103  --  101 103 100 99  CO2 25  --  27 25 26 26   GLUCOSE 93  --  89 87 94 86  BUN 20  --  18 19 20 14   CREATININE  <0.30* 0.36* 0.34* 0.33* 0.42* 0.30*  CALCIUM 8.6*  --  8.6* 8.3* 8.8* 8.5*  MG  --   --  2.2  --   --   --    GFR: Estimated Creatinine Clearance: 117 mL/min (A) (by C-G formula based on SCr of 0.3 mg/dL (L)). Liver Function Tests: Recent Labs  Lab 06/01/21 0404  AST 22  ALT 22  ALKPHOS 159*  BILITOT 0.3  PROT 6.2*  ALBUMIN 2.0*    No results for input(s): LIPASE, AMYLASE in the last 168 hours. No results for input(s): AMMONIA in the last 168 hours. Coagulation Profile: No results for input(s): INR, PROTIME in the last 168 hours. Cardiac Enzymes: No results for input(s): CKTOTAL, CKMB, CKMBINDEX, TROPONINI in the last 168 hours. BNP (last 3 results) No results for input(s): PROBNP in the last 8760 hours. HbA1C: No results for input(s): HGBA1C in the last 72 hours. CBG: Recent Labs  Lab 05/28/21 2058  GLUCAP 131*   Lipid Profile: No results for input(s): CHOL, HDL, LDLCALC, TRIG, CHOLHDL, LDLDIRECT in the last 72 hours. Thyroid Function Tests: No results for input(s): TSH, T4TOTAL, FREET4, T3FREE, THYROIDAB in the last 72 hours. Anemia Panel: No results for input(s): VITAMINB12, FOLATE, FERRITIN, TIBC, IRON, RETICCTPCT in the last 72 hours. Urine analysis:    Component Value Date/Time   COLORURINE YELLOW (A) 02/20/2021 1432   APPEARANCEUR CLEAR (A) 02/20/2021 1432   LABSPEC >1.046 (H) 02/20/2021 1432   PHURINE 6.0 02/20/2021 1432  GLUCOSEU NEGATIVE 02/20/2021 1432   HGBUR NEGATIVE 02/20/2021 1432   HGBUR negative 06/14/2010 1119   BILIRUBINUR NEGATIVE 02/20/2021 1432   BILIRUBINUR SMALL 01/04/2016 [place] 02/20/2021 1432   PROTEINUR 100 (A) 02/20/2021 1432   UROBILINOGEN 2.0 (H) 02/05/2018 1825   NITRITE NEGATIVE 02/20/2021 1432   LEUKOCYTESUR NEGATIVE 02/20/2021 1432   Sepsis Labs: @LABRCNTIP (procalcitonin:4,lacticidven:4)  ) Recent Results (from the past 240 hour(s))  Body fluid culture w Gram Stain     Status: None   Collection  Time: 05/24/21 11:18 AM   Specimen: PATH Cytology Pleural fluid  Result Value Ref Range Status   Specimen Description   Final    PLEURAL Performed at [facility], [place] [address] [place], [place] [address]    Special Requests   Final    NONE Performed at [facility], [place] [address] [place], [place] [address]    Gram Stain   Final    RARE WBC PRESENT, PREDOMINANTLY MONONUCLEAR NO ORGANISMS SEEN    Culture   Final    NO GROWTH 3 DAYS Performed at [facility], [place] [address], [place], [place] [address]    Report Status 05/28/2021 FINAL  Final  Expectorated Sputum Assessment w Gram Stain, Rflx to Resp Cult     Status: None   Collection Time: 05/25/21  9:06 AM   Specimen: Expectorated Sputum  Result Value Ref Range Status   Specimen Description EXPECTORATED SPUTUM  Final   Special Requests Normal  Final   Sputum evaluation   Final    THIS SPECIMEN IS ACCEPTABLE FOR SPUTUM CULTURE Performed at [facility], [place] [address] [place], [place] [address]    Report Status 05/25/2021 FINAL  Final  Culture, Respiratory w Gram Stain     Status: None   Collection Time: 05/25/21  9:06 AM  Result Value Ref Range Status   Specimen Description   Final    EXPECTORATED SPUTUM Performed at [facility], [place] [address], [place], [place] [address]    Special Requests   Final    Normal Reflexed from [phone] [phone] Performed at [facility], [place] [address], [place], [place] [address]    Gram Stain   Final    RARE WBC PRESENT, PREDOMINANTLY PMN FEW GRAM POSITIVE COCCI IN PAIRS IN CLUSTERS RARE GRAM NEGATIVE RODS    Culture   Final    Normal respiratory flora-no Staph aureus or Pseudomonas seen Performed at [facility], [address], [place], [place] [address]    Report Status 05/27/2021 FINAL  Final  Culture, body fluid w Gram Stain-bottle     Status: None   Collection Time: 05/25/21  12:00 PM   Specimen: Pleura  Result Value Ref Range Status   Specimen Description PLEURAL FLUID LEFT  Final   Special Requests NONE  Final   Culture   Final    NO GROWTH 5 DAYS Performed at [place] [address], [place], [place] [address]    Report Status 05/30/2021 FINAL  Final  Gram stain     Status: None   Collection Time: 05/25/21 12:00 PM   Specimen: Pleura  Result Value Ref Range Status   Specimen Description PLEURAL FLUID LEFT  Final   Special Requests NONE  Final   Gram Stain   Final    WBC PRESENT,BOTH PMN AND MONONUCLEAR NO ORGANISMS SEEN CYTOSPIN SMEAR Performed at [facility], [address] [place], [place] [address]    Report Status 05/25/2021 FINAL  Final      Studies: No results found.  Scheduled Meds:  (feeding supplement) PROSource Plus  30 mL Oral Daily   vitamin C  250 mg Oral Daily   baclofen  20 mg Oral TID   bisacodyl  10 mg Rectal QODAY   calcium carbonate  3 tablet Oral Q breakfast   chlorhexidine  15 mL Mouth Rinse BID   Chlorhexidine Gluconate Cloth  6 each Topical Daily   cholecalciferol  5,000 Units Oral Daily   clotrimazole  1 application Topical BID   diclofenac Sodium  4 g Topical QID   divalproex  500 mg Oral BID   enoxaparin (LOVENOX) injection  40 mg Subcutaneous Q24H   feeding supplement  237 mL Oral BID BM   fluticasone  1 spray Each Nare Daily   hydrocortisone cream  1 application Topical BID   ipratropium-albuterol  3 mL Nebulization BID   iron polysaccharides  150 mg Oral Q1200   ketoconazole  1 application Topical Once per day on Mon Thu   lactulose  15 g Oral Daily   linaclotide  145 mcg Oral QAC breakfast   lisinopril  2.5 mg Oral Daily   loratadine  10 mg Oral Daily   mouth rinse  15 mL Mouth Rinse BID   morphine  15 mg Oral Q12H   pantoprazole  40 mg Oral Daily   polyethylene glycol  17 g Oral BID   risperiDONE  2 mg Oral QHS   senna-docusate  2 tablet Oral BID   tamsulosin  0.4 mg Oral QPC  supper    Continuous Infusions:      LOS: 10 days     Kathie Dike, MD [facility]   If 7PM-7AM, please contact night-coverage [email or web address]  06/02/2021, 8:19 PM

## 2021-06-03 ENCOUNTER — Encounter (HOSPITAL_COMMUNITY): Payer: Self-pay | Admitting: Internal Medicine

## 2021-06-03 ENCOUNTER — Inpatient Hospital Stay (HOSPITAL_COMMUNITY): Payer: Medicare Other

## 2021-06-03 DIAGNOSIS — I5032 Chronic diastolic (congestive) heart failure: Secondary | ICD-10-CM | POA: Diagnosis not present

## 2021-06-03 DIAGNOSIS — R0600 Dyspnea, unspecified: Secondary | ICD-10-CM | POA: Diagnosis not present

## 2021-06-03 DIAGNOSIS — J189 Pneumonia, unspecified organism: Secondary | ICD-10-CM | POA: Diagnosis not present

## 2021-06-03 DIAGNOSIS — R509 Fever, unspecified: Secondary | ICD-10-CM | POA: Diagnosis not present

## 2021-06-03 LAB — COMPREHENSIVE METABOLIC PANEL
ALT: 26 U/L (ref 0–44)
AST: 26 U/L (ref 15–41)
Albumin: 2.2 g/dL — ABNORMAL LOW (ref 3.5–5.0)
Alkaline Phosphatase: 206 U/L — ABNORMAL HIGH (ref 38–126)
Anion gap: 8 (ref 5–15)
BUN: 17 mg/dL (ref 8–23)
CO2: 25 mmol/L (ref 22–32)
Calcium: 9.1 mg/dL (ref 8.9–10.3)
Chloride: 99 mmol/L (ref 98–111)
Creatinine, Ser: 0.39 mg/dL — ABNORMAL LOW (ref 0.61–1.24)
GFR, Estimated: >60 mL/min (ref >60)
Glucose, Bld: 233 mg/dL — ABNORMAL HIGH (ref 70–99)
Potassium: 4.5 mmol/L (ref 3.5–5.1)
Sodium: 132 mmol/L — ABNORMAL LOW (ref 135–145)
Total Bilirubin: 0.4 mg/dL (ref 0.3–1.2)
Total Protein: 7.1 g/dL (ref 6.5–8.1)

## 2021-06-03 LAB — CBC
HCT: 32.8 % — ABNORMAL LOW (ref 39.0–52.0)
Hemoglobin: 10 g/dL — ABNORMAL LOW (ref 13.0–17.0)
MCH: 23.4 pg — ABNORMAL LOW (ref 26.0–34.0)
MCHC: 30.5 g/dL (ref 30.0–36.0)
MCV: 76.8 fL — ABNORMAL LOW (ref 80.0–100.0)
Platelets: 903 10*3/uL (ref 150–400)
RBC: 4.27 MIL/uL (ref 4.22–5.81)
RDW: 19.4 % — ABNORMAL HIGH (ref 11.5–15.5)
WBC: 11.5 10*3/uL — ABNORMAL HIGH (ref 4.0–10.5)
nRBC: 0 % (ref 0.0–0.2)

## 2021-06-03 LAB — BLOOD GAS, VENOUS
Acid-Base Excess: 2.9 mmol/L — ABNORMAL HIGH (ref 0.0–2.0)
Bicarbonate: 28.7 mmol/L — ABNORMAL HIGH (ref 20.0–28.0)
O2 Saturation: 63 %
Patient temperature: 98.6
pCO2, Ven: 53.9 mmHg (ref 44.0–60.0)
pH, Ven: 7.346 (ref 7.250–7.430)
pO2, Ven: 37.2 mmHg (ref 32.0–45.0)

## 2021-06-03 LAB — IRON AND TIBC
Iron: 19 ug/dL — ABNORMAL LOW (ref 45–182)
Saturation Ratios: 6 % — ABNORMAL LOW (ref 17.9–39.5)
TIBC: 322 ug/dL (ref 250–450)
UIBC: 303 ug/dL

## 2021-06-03 LAB — VALPROIC ACID LEVEL: Valproic Acid Lvl: 17 ug/mL — ABNORMAL LOW (ref 50.0–100.0)

## 2021-06-03 LAB — FERRITIN: Ferritin: 102 ng/mL (ref 24–336)

## 2021-06-03 LAB — AMMONIA: Ammonia: 94 umol/L — ABNORMAL HIGH (ref 9–35)

## 2021-06-03 LAB — TSH: TSH: 1.311 u[IU]/mL (ref 0.350–4.500)

## 2021-06-03 LAB — MAGNESIUM: Magnesium: 2 mg/dL (ref 1.7–2.4)

## 2021-06-03 MED ORDER — SODIUM CHLORIDE 0.9 % IV SOLN: 3000.0000 mg | Freq: Once | INTRAVENOUS | Status: DC

## 2021-06-03 MED ORDER — OXYBUTYNIN CHLORIDE 5 MG PO TABS
5.0000 mg | ORAL_TABLET | Freq: Three times a day (TID) | ORAL | Status: DC
  Administered 2021-06-03 – 2021-06-05 (×6): 5 mg via ORAL
  Filled 2021-06-03 (×6): qty 1

## 2021-06-03 MED ORDER — IOHEXOL 350 MG/ML SOLN
80.0000 mL | Freq: Once | INTRAVENOUS | Status: AC | PRN
  Administered 2021-06-04: 80 mL via INTRAVENOUS

## 2021-06-03 MED ORDER — LACTULOSE 10 GM/15ML PO SOLN
20.0000 g | Freq: Three times a day (TID) | ORAL | Status: DC
  Administered 2021-06-03 – 2021-06-04 (×3): 20 g via ORAL
  Filled 2021-06-03 (×3): qty 30

## 2021-06-03 MED ORDER — CEFAZOLIN SODIUM-DEXTROSE 2-4 GM/100ML-% IV SOLN
2.0000 g | Freq: Three times a day (TID) | INTRAVENOUS | Status: DC
  Administered 2021-06-03 – 2021-06-05 (×5): 2 g via INTRAVENOUS
  Filled 2021-06-03 (×7): qty 100

## 2021-06-03 MED ORDER — SODIUM CHLORIDE 0.9 % IV SOLN
3000.0000 mg | Freq: Once | INTRAVENOUS | Status: AC
  Administered 2021-06-04: 3000 mg via INTRAVENOUS
  Filled 2021-06-03: qty 15

## 2021-06-03 NOTE — Progress Notes (Signed)
PROGRESS NOTE  [name] [id number] DOB: [birth year] DOA: 05/22/2021 PCP: [name] Hose, MD  HPI/Recap of past 24 hours:  [name] is a 61 y.o. male with medical history significant of chronic diastolic CHF, right foot amputee, iron deficiency anemia, anxiety, bipolar disorder, intellectual functional disability, obsessive-compulsive disorder, cerebral palsy, chronic bronchitis, constipation, environmental allergies, gastric ulcer with history of UGI bleed necessitating blood transfusion, GERD, hiatal hernia, history of varicella-zoster, hyperlipidemia, osteomyelitis of right foot, history of pneumonia, pressure ulcer of sacral area, history of Pseudomonas infection, recurrent UTIs, seborrheic dermatitis, vitamin D deficiency, functional quadriplegia who was brought from his group home due to productive cough for about a week.  Work up revealed community-acquired pneumonia with concern for possible aspiration, also revealed large B/L pleural effusions.  He was started on Rocephin and azithromycin empirically.  Post R thoracentesis by IR on 12/29 and post L thoracentesis by IR on 12/30.  06/03/2021: Staff reports that he has had several bowel movements today.  He is continue to complain of pain in his wound as well as his abdomen.  Having periods of lethargy today and decreased responsiveness during conversation.    Assessment/Plan: Principal Problem:   Acute dyspnea Active Problems:   Infantile cerebral palsy (HCC)   Pressure ulcer of foot   Functional quadriplegia (HCC)   Stage 3 skin ulcer of sacral region (HCC)   Microcytic anemia   Lactic acidosis   Iron deficiency anemia   Protein-calorie malnutrition, severe   Chronic diastolic CHF (congestive heart failure) (HCC)   Malnutrition of moderate degree  Resolved acute dyspnea secondary to community-acquired pneumonia with concern for recurrent aspiration and large B/L pleural effusions, s/p bilateral thoracentesis by  IR. Speech therapist evaluated, recs reg consistency and thin fluids with aspiration precautions. Maintain oxygen saturation greater 92% No longer requiring oxygen supplementation post bilateral thoracentesis. Treat underlying conditions Completed 5 days of azithromycin Completed 5 days of Rocephin MRSA screening test positive  Sacral Pressure wounds, present on admission Continue local wound care with wound care specialist guidance. Patient does have some erythema around his wound, no drainage appreciated.  We will start Ancef and likely transition to oral antibiotics on discharge. Continue pain control Started on MS Contin per palliative Due to mental status issues, will discontinue MS contin  Abdominal pain -Complains of persistent abdominal pain, possibly related to constipation -Due to the persistence and severity of his pain, will check CT of the abdomen pelvis to rule out any other underlying pathologies.  Sinus pauses -Noted to have several episodes of 3.5-second pauses on telemetry today -Difficult to say if he is symptomatic -He has been having periods of transient decrease in responsiveness which may be co-related -TSH checked and noted to be normal -Potassium and magnesium also normal -VBG did not show significant hypercarbia. -Continue to monitor on telemetry -We will request cardiology to review regarding any further work-up  Hyperammonemia -He does not have any known liver disease -Unclear if this is contributing to his mental status issues today -He is chronically on Depakote for behavioral issues -Check Depakote level, hold further Depakote for now -We will start L-carnitine since mental status issues may be related to hyperammonemia.  Discussed dosing with pharmacy.  Chronic diastolic CHF Continue strict I's and O's and daily weight  Infantile cerebral palsy Functional quadriplegia Muscle relaxers as needed Analgesics as needed  Iron deficiency  anemia Hemoglobin has been stable Iron 19, ferritin 102  Thrombocytosis -Suspect this is reactive to underlying inflammatory  process, possibly his wound -Continue to monitor  Severe protein calorie malnutrition Continue protein supplementation  Chronic constipation Continue senokot 2 tabs bid, miralax bid Dulcolax sup every other day Smog enema 12/30 Continue bowel regimen Abdomen does appear to be distended today KUB shows persistent stool burden He has received multiple enemas and suppositories Lactulose also increased to 3 times daily   Chronic indwelling foley catheter Facility confirms that patient has a chronic foley catheter Continue Flomax   Goals of care DNR Palliative care team consulted to assist with establishing goals of care Patient will be followed by palliative care as an outpatient   Code Status: DNR  Family Communication: Updated patient's guardian/sister, [name]  Disposition Plan: Likely return to group home.   Consultants: Palliative care team  Procedures: None  Antimicrobials: Rocephin Azithromycin  DVT prophylaxis: Subcu Lovenox daily  Status is: Inpatient  Patient requires at least 2 midnights for further evaluation and treatment.      Objective: Vitals:   06/03/21 0405 06/03/21 0500 06/03/21 0628 06/03/21 1106  BP: 136/78  136/78 115/69  Pulse: 96  96 76  Resp: 18  18 (!) 21  Temp: 98.2 F (36.8 C)  98.2 F (36.8 C) 98.6 F (37 C)  TempSrc: Oral  Oral Oral  SpO2: 96%   95%  Weight:  99.8 kg 99.8 kg   Height:   6' (1.829 m)     Intake/Output Summary (Last 24 hours) at 06/03/2021 2023 Last data filed at 06/03/2021 1820 Gross per 24 hour  Intake 717 ml  Output 535 ml  Net 182 ml   Filed Weights   05/31/21 0500 06/03/21 0500 06/03/21 0628  Weight: 96.9 kg 99.8 kg 99.8 kg    Exam:  General exam: Appears to be more somnolent today. Respiratory system: bilateral rhonchi. Respiratory effort  normal. Cardiovascular system:RRR. No murmurs, rubs, gallops. Gastrointestinal system: Abdomen is less distended and softer today and nontender. No organomegaly or masses felt. Normal bowel sounds heard. Extremities: right lower extremity amputation Skin: pressure wounds on sacrum, present on admission.  There is some erythema around wound. Psychiatry: He does answer questions, but seems more somnolent.      Data Reviewed: CBC: Recent Labs  Lab 05/29/21 0631 05/31/21 0400 06/01/21 0404 06/02/21 0402 06/03/21 1435  WBC 12.1* 10.2 10.6* 9.4 11.5*  HGB 9.7* 9.0* 9.1* 9.4* 10.0*  HCT 29.1* 27.7* 29.6* 29.9* 32.8*  MCV 73.1* 73.5* 75.7* 76.1* 76.8*  PLT 731* 780* 782* 847* 0000000*   Basic Metabolic Panel: Recent Labs  Lab 05/29/21 0631 05/31/21 0400 06/01/21 0404 06/02/21 0402 06/03/21 1433 06/03/21 1435  NA 133* 133* 136 132*  --  132*  K 4.0 4.1 4.4 4.6  --  4.5  CL 101 103 100 99  --  99  CO2 27 25 26 26   --  25  GLUCOSE 89 87 94 86  --  233*  BUN 18 19 20 14   --  17  CREATININE 0.34* 0.33* 0.42* 0.30*  --  0.39*  CALCIUM 8.6* 8.3* 8.8* 8.5*  --  9.1  MG 2.2  --   --   --  2.0  --    GFR: Estimated Creatinine Clearance: 118.6 mL/min (A) (by C-G formula based on SCr of 0.39 mg/dL (L)). Liver Function Tests: Recent Labs  Lab 06/01/21 0404 06/03/21 1435  AST 22 26  ALT 22 26  ALKPHOS 159* 206*  BILITOT 0.3 0.4  PROT 6.2* 7.1  ALBUMIN 2.0* 2.2*  No results for input(s): LIPASE, AMYLASE in the last 168 hours. Recent Labs  Lab 06/03/21 1435  AMMONIA 94*   Coagulation Profile: No results for input(s): INR, PROTIME in the last 168 hours. Cardiac Enzymes: No results for input(s): CKTOTAL, CKMB, CKMBINDEX, TROPONINI in the last 168 hours. BNP (last 3 results) No results for input(s): PROBNP in the last 8760 hours. HbA1C: No results for input(s): HGBA1C in the last 72 hours. CBG: Recent Labs  Lab 05/28/21 2058  GLUCAP 131*   Lipid Profile: No results  for input(s): CHOL, HDL, LDLCALC, TRIG, CHOLHDL, LDLDIRECT in the last 72 hours. Thyroid Function Tests: Recent Labs    06/03/21 1613  TSH 1.311   Anemia Panel: Recent Labs    06/03/21 1613  FERRITIN 102  TIBC 322  IRON 19*   Urine analysis:    Component Value Date/Time   COLORURINE YELLOW (A) 02/20/2021 1432   APPEARANCEUR CLEAR (A) 02/20/2021 1432   LABSPEC >1.046 (H) 02/20/2021 1432   PHURINE 6.0 02/20/2021 1432   GLUCOSEU NEGATIVE 02/20/2021 1432   HGBUR NEGATIVE 02/20/2021 1432   HGBUR negative 06/14/2010 1119   BILIRUBINUR NEGATIVE 02/20/2021 1432   BILIRUBINUR SMALL 01/04/2016 1634   KETONESUR NEGATIVE 02/20/2021 1432   PROTEINUR 100 (A) 02/20/2021 1432   UROBILINOGEN 2.0 (H) 02/05/2018 1825   NITRITE NEGATIVE 02/20/2021 1432   LEUKOCYTESUR NEGATIVE 02/20/2021 1432   Sepsis Labs: @LABRCNTIP (procalcitonin:4,lacticidven:4)  ) Recent Results (from the past 240 hour(s))  Expectorated Sputum Assessment w Gram Stain, Rflx to Resp Cult     Status: None   Collection Time: 05/25/21  9:06 AM   Specimen: Expectorated Sputum  Result Value Ref Range Status   Specimen Description EXPECTORATED SPUTUM  Final   Special Requests Normal  Final   Sputum evaluation   Final    THIS SPECIMEN IS ACCEPTABLE FOR SPUTUM CULTURE Performed at [facility], [place] [address] [place], [place] [address]    Report Status 05/25/2021 FINAL  Final  Culture, Respiratory w Gram Stain     Status: None   Collection Time: 05/25/21  9:06 AM  Result Value Ref Range Status   Specimen Description   Final    EXPECTORATED SPUTUM Performed at [facility], [place] [address], [place], [place] [address]    Special Requests   Final    Normal Reflexed from [id number] Performed at [facility], [place] [address], [place], [place] [address]    Gram Stain   Final    RARE WBC PRESENT, PREDOMINANTLY PMN FEW GRAM POSITIVE COCCI IN PAIRS IN CLUSTERS RARE GRAM  NEGATIVE RODS    Culture   Final    Normal respiratory flora-no Staph aureus or Pseudomonas seen Performed at [facility], [address], [place], [place] [address]    Report Status 05/27/2021 FINAL  Final  Culture, body fluid w Gram Stain-bottle     Status: None   Collection Time: 05/25/21 12:00 PM   Specimen: Pleura  Result Value Ref Range Status   Specimen Description PLEURAL FLUID LEFT  Final   Special Requests NONE  Final   Culture   Final    NO GROWTH 5 DAYS Performed at [place] [address], [place], [place] [address]    Report Status 05/30/2021 FINAL  Final  Gram stain     Status: None   Collection Time: 05/25/21 12:00 PM   Specimen: Pleura  Result Value Ref Range Status   Specimen Description  PLEURAL FLUID LEFT  Final   Special Requests NONE  Final   Gram Stain   Final    WBC PRESENT,BOTH PMN AND MONONUCLEAR NO ORGANISMS SEEN CYTOSPIN SMEAR Performed at [facility], [address], [place], [place] [address]    Report Status 05/25/2021 FINAL  Final      Studies: No results found.  Scheduled Meds:  (feeding supplement) PROSource Plus  30 mL Oral Daily   vitamin C  250 mg Oral Daily   baclofen  20 mg Oral TID   bisacodyl  10 mg Rectal QODAY   calcium carbonate  3 tablet Oral Q breakfast   chlorhexidine  15 mL Mouth Rinse BID   Chlorhexidine Gluconate Cloth  6 each Topical Daily   cholecalciferol  5,000 Units Oral Daily   clotrimazole  1 application Topical BID   diclofenac Sodium  4 g Topical QID   divalproex  500 mg Oral BID   enoxaparin (LOVENOX) injection  40 mg Subcutaneous Q24H   feeding supplement  237 mL Oral BID BM   fluticasone  1 spray Each Nare Daily   hydrocortisone cream  1 application Topical BID   iron polysaccharides  150 mg Oral Q1200   ketoconazole  1 application Topical Once per day on Mon Thu   lactulose  20 g Oral TID   linaclotide  145 mcg Oral QAC breakfast   lisinopril  2.5 mg Oral Daily   loratadine   10 mg Oral Daily   mouth rinse  15 mL Mouth Rinse BID   oxybutynin  5 mg Oral TID   pantoprazole  40 mg Oral Daily   polyethylene glycol  17 g Oral BID   risperiDONE  2 mg Oral QHS   senna-docusate  2 tablet Oral BID   tamsulosin  0.4 mg Oral QPC supper    Continuous Infusions:      LOS: 11 days     Kathie Dike, MD [facility]   If 7PM-7AM, please contact night-coverage [email or web address]  06/03/2021, 8:23 PM

## 2021-06-03 NOTE — Progress Notes (Signed)
RT attempted to get an ABG and was not successful. RT let the doctor know and he is going to add an VBG

## 2021-06-03 NOTE — Progress Notes (Signed)
PHARMACY NOTE   Pharmacy consulted to assist with dosing of Cefazolin for sacral wound infection. Ordered Cefazolin 2g IV q8h.   Need for further dosage adjustment appears unlikely at present.    Pharmacy will sign off at this time.  Please reconsult if a change in clinical status warrants re-evaluation of dosage.   Greer Pickerel, PharmD, BCPS Clinical Pharmacist  06/03/2021 8:54 PM

## 2021-06-03 NOTE — Progress Notes (Signed)
°   06/03/21 1509  Provider Notification  Provider Name/Title Kathie Dike MD  Date Provider Notified 06/03/21  Time Provider Notified 1515  Notification Type Page  Notification Reason Critical result  Test performed and critical result Platelets 903  Date Critical Result Received 06/03/21  Time Critical Result Received 1509  Provider response See new orders  Date of Provider Response 06/03/21  Time of Provider Response 1520   MD notified of critical platelet value-903

## 2021-06-04 ENCOUNTER — Inpatient Hospital Stay (HOSPITAL_COMMUNITY): Payer: Medicare Other

## 2021-06-04 DIAGNOSIS — R532 Functional quadriplegia: Secondary | ICD-10-CM | POA: Diagnosis not present

## 2021-06-04 DIAGNOSIS — J189 Pneumonia, unspecified organism: Secondary | ICD-10-CM | POA: Diagnosis not present

## 2021-06-04 DIAGNOSIS — R509 Fever, unspecified: Secondary | ICD-10-CM | POA: Diagnosis not present

## 2021-06-04 DIAGNOSIS — E43 Unspecified severe protein-calorie malnutrition: Secondary | ICD-10-CM | POA: Diagnosis not present

## 2021-06-04 DIAGNOSIS — I5032 Chronic diastolic (congestive) heart failure: Secondary | ICD-10-CM | POA: Diagnosis not present

## 2021-06-04 DIAGNOSIS — I471 Supraventricular tachycardia: Secondary | ICD-10-CM

## 2021-06-04 DIAGNOSIS — R0600 Dyspnea, unspecified: Secondary | ICD-10-CM | POA: Diagnosis not present

## 2021-06-04 DIAGNOSIS — I442 Atrioventricular block, complete: Secondary | ICD-10-CM

## 2021-06-04 LAB — CBC
HCT: 29.1 % — ABNORMAL LOW (ref 39.0–52.0)
Hemoglobin: 9.1 g/dL — ABNORMAL LOW (ref 13.0–17.0)
MCH: 23.9 pg — ABNORMAL LOW (ref 26.0–34.0)
MCHC: 31.3 g/dL (ref 30.0–36.0)
MCV: 76.4 fL — ABNORMAL LOW (ref 80.0–100.0)
Platelets: 815 10*3/uL — ABNORMAL HIGH (ref 150–400)
RBC: 3.81 MIL/uL — ABNORMAL LOW (ref 4.22–5.81)
RDW: 19.2 % — ABNORMAL HIGH (ref 11.5–15.5)
WBC: 10.3 10*3/uL (ref 4.0–10.5)
nRBC: 0.3 % — ABNORMAL HIGH (ref 0.0–0.2)

## 2021-06-04 LAB — COMPREHENSIVE METABOLIC PANEL
ALT: 21 U/L (ref 0–44)
AST: 20 U/L (ref 15–41)
Albumin: 2 g/dL — ABNORMAL LOW (ref 3.5–5.0)
Alkaline Phosphatase: 169 U/L — ABNORMAL HIGH (ref 38–126)
Anion gap: 6 (ref 5–15)
BUN: 16 mg/dL (ref 8–23)
CO2: 29 mmol/L (ref 22–32)
Calcium: 8.6 mg/dL — ABNORMAL LOW (ref 8.9–10.3)
Chloride: 98 mmol/L (ref 98–111)
Creatinine, Ser: <0.3 mg/dL — ABNORMAL LOW (ref 0.61–1.24)
Glucose, Bld: 104 mg/dL — ABNORMAL HIGH (ref 70–99)
Potassium: 3.9 mmol/L (ref 3.5–5.1)
Sodium: 133 mmol/L — ABNORMAL LOW (ref 135–145)
Total Bilirubin: 0.4 mg/dL (ref 0.3–1.2)
Total Protein: 6.1 g/dL — ABNORMAL LOW (ref 6.5–8.1)

## 2021-06-04 LAB — HEPARIN LEVEL (UNFRACTIONATED)
Heparin Unfractionated: 0.54 IU/mL (ref 0.30–0.70)
Heparin Unfractionated: 0.37 IU/mL (ref 0.30–0.70)

## 2021-06-04 LAB — AMMONIA: Ammonia: 40 umol/L — ABNORMAL HIGH (ref 9–35)

## 2021-06-04 MED ORDER — DIVALPROEX SODIUM 125 MG PO CSDR
500.0000 mg | DELAYED_RELEASE_CAPSULE | Freq: Two times a day (BID) | ORAL | Status: DC
  Administered 2021-06-04 – 2021-06-05 (×3): 500 mg via ORAL
  Filled 2021-06-04 (×6): qty 4

## 2021-06-04 MED ORDER — HEPARIN BOLUS VIA INFUSION
2700.0000 [IU] | Freq: Once | INTRAVENOUS | Status: AC
  Administered 2021-06-04: 2700 [IU] via INTRAVENOUS
  Filled 2021-06-04: qty 2700

## 2021-06-04 MED ORDER — LIDOCAINE HCL 1 % IJ SOLN
INTRAMUSCULAR | Status: AC
  Administered 2021-06-04: 10 mL
  Filled 2021-06-04: qty 20

## 2021-06-04 MED ORDER — HEPARIN (PORCINE) 25000 UT/250ML-% IV SOLN
1600.0000 [IU]/h | INTRAVENOUS | Status: DC
  Administered 2021-06-04 – 2021-06-05 (×2): 1600 [IU]/h via INTRAVENOUS
  Filled 2021-06-04 (×3): qty 250

## 2021-06-04 NOTE — Progress Notes (Signed)
ANTICOAGULATION CONSULT NOTE - Follow Up Consult  Pharmacy Consult for Heparin Indication: VTE treatment  Allergies  Allergen Reactions   Adhesive [Tape] Rash    Patient Measurements: Height: 6' (182.9 cm) Weight:  (can't measure wt because bed doesn't work for [employer] wt) IBW/kg (Calculated) : 77.6 Heparin Dosing Weight: TBW  Vital Signs: Temp: 98.3 F (36.8 C) (01/09 1139) Temp Source: Oral (01/09 1139) BP: 103/53 (01/09 1139) Pulse Rate: 63 (01/09 1139)  Labs: Recent Labs    06/02/21 0402 06/03/21 1435 06/04/21 0352 06/04/21 1350  HGB 9.4* 10.0* 9.1*  --   HCT 29.9* 32.8* 29.1*  --   PLT 847* 903* 815*  --   HEPARINUNFRC  --   --   --  0.54  CREATININE 0.30* 0.39* <0.30*  --     CrCl cannot be calculated (This lab value cannot be used to calculate CrCl because it is not a number: <0.30).   Medications:  Scheduled:   (feeding supplement) PROSource Plus  30 mL Oral Daily   vitamin C  250 mg Oral Daily   baclofen  20 mg Oral TID   bisacodyl  10 mg Rectal QODAY   calcium carbonate  3 tablet Oral Q breakfast   chlorhexidine  15 mL Mouth Rinse BID   Chlorhexidine Gluconate Cloth  6 each Topical Daily   cholecalciferol  5,000 Units Oral Daily   clotrimazole  1 application Topical BID   diclofenac Sodium  4 g Topical QID   divalproex  500 mg Oral BID   feeding supplement  237 mL Oral BID BM   fluticasone  1 spray Each Nare Daily   hydrocortisone cream  1 application Topical BID   iron polysaccharides  150 mg Oral Q1200   ketoconazole  1 application Topical Once per day on Mon Thu   lactulose  20 g Oral TID   lidocaine       lisinopril  2.5 mg Oral Daily   loratadine  10 mg Oral Daily   mouth rinse  15 mL Mouth Rinse BID   oxybutynin  5 mg Oral TID   pantoprazole  40 mg Oral Daily   polyethylene glycol  17 g Oral BID   risperiDONE  2 mg Oral QHS   senna-docusate  2 tablet Oral BID   tamsulosin  0.4 mg Oral QPC supper   Infusions:    ceFAZolin (ANCEF)  IV 2 g (06/04/21 1459)   heparin 1,600 Units/hr (06/04/21 0803)    Assessment: 40 yoM admitted 12/27 with acute dyspnea with suspected aspiration pneumonia; underwent thoracentesis for pleural effusions.  CTa showed thrombosis of the main portal and mesenteric vein; suspect thrombus in the splenic vein  Patient had been on Enoxaparin 40mg  sq q24h for VTE prophylaxis since admission on 05/22/21 - last administered dose was 06/03/21 @ 17:47  Today, 06/04/2021 Heparin level 0.54, therapeutic on heparin 1600 units/hr. CBC:  Hgb down to 9.1, Plt remains elevated No bleeding or complications reported   Goal of Therapy:  Heparin level 0.3-0.7 units/ml Monitor platelets by anticoagulation protocol: Yes   Plan:  Continue heparin IV infusion at 1600 units/hr Confirmatory heparin level in 6 hours Daily heparin level and CBC   08/02/2021 PharmD, BCPS Clinical Pharmacist [facility] main pharmacy [phone] 06/04/2021 3:09 PM

## 2021-06-04 NOTE — Progress Notes (Signed)
ANTICOAGULATION CONSULT NOTE - Follow Up Consult  Pharmacy Consult for Heparin Indication: VTE treatment  Allergies  Allergen Reactions   Adhesive [Tape] Rash    Patient Measurements: Height: 6' (182.9 cm) Weight:  (can't measure wt because bed doesn't work for [employer] wt) IBW/kg (Calculated) : 77.6 Heparin Dosing Weight: TBW  Vital Signs: Temp: 98.3 F (36.8 C) (01/09 1139) Temp Source: Oral (01/09 1139) BP: 103/53 (01/09 1139) Pulse Rate: 63 (01/09 1139)  Labs: Recent Labs    06/02/21 0402 06/03/21 1435 06/04/21 0352 06/04/21 1350 06/04/21 1954  HGB 9.4* 10.0* 9.1*  --   --   HCT 29.9* 32.8* 29.1*  --   --   PLT 847* 903* 815*  --   --   HEPARINUNFRC  --   --   --  0.54 0.37  CREATININE 0.30* 0.39* <0.30*  --   --      CrCl cannot be calculated (This lab value cannot be used to calculate CrCl because it is not a number: <0.30).   Assessment: 82 yoM admitted 12/27 with acute dyspnea with suspected aspiration pneumonia; underwent thoracentesis for pleural effusions.  CTa showed thrombosis of the main portal and mesenteric vein; suspect thrombus in the splenic vein  Patient had been on Enoxaparin 40mg  sq q24h for VTE prophylaxis since admission on 05/22/21 - last administered dose was 06/03/21 @ 17:47  Today, 06/04/2021 Confirmatory Heparin level 0.37, remains therapeutic on heparin 1600 units/hr. CBC:  Hgb down to 9.1, Plt remains elevated No bleeding or complications reported   Goal of Therapy:  Heparin level 0.3-0.7 units/ml Monitor platelets by anticoagulation protocol: Yes   Plan:  Continue heparin IV infusion at 1600 units/hr Daily heparin level and CBC  Eudelia Bunch, Pharm.D 06/04/2021 8:24 PM

## 2021-06-04 NOTE — Progress Notes (Signed)
Daily Progress Note   Patient Name: [name]       Date: 06/04/2021 DOB: 07-25-1960  Age: 61 y.o. MRN#: [id number] Attending Physician: Kathie Dike, MD Primary Care Physician: Audley Hose, MD Admit Date: 05/22/2021  Reason for Consultation/Follow-up: Establishing goals of care  Subjective:  Continues to appear weak and chronically ill.   Length of Stay: 12  Current Medications: Scheduled Meds:   (feeding supplement) PROSource Plus  30 mL Oral Daily   vitamin C  250 mg Oral Daily   baclofen  20 mg Oral TID   bisacodyl  10 mg Rectal QODAY   calcium carbonate  3 tablet Oral Q breakfast   chlorhexidine  15 mL Mouth Rinse BID   Chlorhexidine Gluconate Cloth  6 each Topical Daily   cholecalciferol  5,000 Units Oral Daily   clotrimazole  1 application Topical BID   diclofenac Sodium  4 g Topical QID   divalproex  500 mg Oral BID   feeding supplement  237 mL Oral BID BM   fluticasone  1 spray Each Nare Daily   hydrocortisone cream  1 application Topical BID   iron polysaccharides  150 mg Oral Q1200   ketoconazole  1 application Topical Once per day on Mon Thu   lactulose  20 g Oral TID   lisinopril  2.5 mg Oral Daily   loratadine  10 mg Oral Daily   mouth rinse  15 mL Mouth Rinse BID   oxybutynin  5 mg Oral TID   pantoprazole  40 mg Oral Daily   polyethylene glycol  17 g Oral BID   risperiDONE  2 mg Oral QHS   senna-docusate  2 tablet Oral BID   tamsulosin  0.4 mg Oral QPC supper    Continuous Infusions:   ceFAZolin (ANCEF) IV 2 g (06/04/21 0532)   heparin 1,600 Units/hr (06/04/21 0803)    PRN Meds: acetaminophen **OR** acetaminophen, albuterol, alum & mag hydroxide-simeth, bisacodyl, guaiFENesin-dextromethorphan, HYDROmorphone (DILAUDID) injection, liver  oil-zinc oxide, LORazepam, ondansetron **OR** ondansetron (ZOFRAN) IV, oxyCODONE  Physical Exam         Resting in bed No distress S 1 S 2  Regular work of breathing wound care notes reviewed.   Vital Signs: BP (!) 103/53 (BP Location: Right Arm)    Pulse 63  Temp 98.3 F (36.8 C) (Oral)    Resp 20    Ht 6' (1.829 m)    Wt 99.8 kg    SpO2 96%    BMI 29.84 kg/m  SpO2: SpO2: 96 % O2 Device: O2 Device: Room Air O2 Flow Rate: O2 Flow Rate (L/min): 3 L/min  Intake/output summary:  Intake/Output Summary (Last 24 hours) at 06/04/2021 1411 Last data filed at 06/04/2021 1242 Gross per 24 hour  Intake 2088.28 ml  Output 950 ml  Net 1138.28 ml    LBM: Last BM Date: 06/04/21 Baseline Weight: Weight: 59.8 kg Most recent weight: Weight:  (can't measure wt because bed doesn't work for [employer] wt)       Palliative Assessment/Data:      Patient Active Problem List   Diagnosis Date Noted   Malnutrition of moderate degree 05/31/2021   Acute dyspnea 05/22/2021   Acute on chronic diastolic CHF (congestive heart failure) ([place]) 05/22/2021   Chronic diastolic CHF (congestive heart failure) ([place]) 05/22/2021   Protein-calorie malnutrition, severe 02/28/2021   Iron deficiency anemia 02/20/2021   Obsessive compulsive disorder    Cellulitis of sacral region 12/06/2020   Right pulmonary infiltrate on CXR 10/03/2020   Dysphagia 10/03/2020   GERD without esophagitis 10/03/2020   Aspiration pneumonia ([place]) 10/03/2020   Recurrent right pleural effusion 10/02/2020   Abnormal CT scan, colon    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   Nausea and vomiting 07/31/2020   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 ([place]) 10/08/2019   Osteomyelitis ([place]) 10/08/2019   Acute osteomyelitis of right ankle or foot ([facility])    Ulcerated, foot, right, with necrosis of bone (H[facility])    Pseudomonas aeruginosa infection 09/06/2019   MRSA infection 09/06/2019   Lactic acidosis    Stage 3 skin ulcer of sacral region ([place])  08/10/2018   Gastric wall thickening 08/10/2018   Alkaline phosphatase elevation 08/10/2018   Cholelithiasis 08/10/2018   Microcytic anemia    Hiatal hernia 04/07/2018   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 ([place]) 01/20/2016   Functional quadriplegia ([place]) 12/15/2015   Cerebral palsy, quadriplegic ([place])    Pre-diabetes 08/31/2015   Foot ulcer, limited to breakdown of skin ([place]) 06/08/2015   Seasonal allergies 11/01/2014   Pressure ulcer of foot 03/14/2014   CAP (community acquired pneumonia) 03/13/2014   Neuropathy 01/13/2013   Decubitus ulcer of left hip, stage 3 ([place]) 01/08/2013   URINARY INCONTINENCE 02/09/2010   Intellectual disability 07/24/2006   Infantile cerebral palsy ([place]) 07/24/2006    Palliative Care Assessment & Plan   Patient Profile:    Assessment:  61 year old gentleman from a group home, history of chronic diastolic congestive heart failure, history of right foot amputation iron deficiency anemia anxiety bipolar disorder, OCD, cerebral palsy, chronic bronchitis constipation allergies, history of gastric ulcer history of GI bleed, GERD hiatal hernia dyslipidemia history of pressure ulcer of sacral area, history of Pseudomonas infection and recurrent urinary tract infections.  Patient has functional quadriplegia.  Brought in from his group home due to productive cough, work-up revealed community-acquired pneumonia with concern for possible aspiration as well as large bilateral pleural effusions.  Patient admitted to hospital medicine service, started on antibiotics, also underwent a right thoracenteses and a left thoracenteses. Ongoing issues this hospitalization include concern for recurrent aspiration, speech therapist have been following and they have recommended for only comfort feeds.   Recommendations/Plan: Patient has chronic pain from his sacral pressure wounds, continue current pain management.  Monitor  oral intake, seen by speech therapist,  continue aspiration precautions. Recommend discharge back to group home with continuation of palliative services towards the end of this hospitalization.  Code Status:    Code Status Orders  (From admission, onward)           Start     Ordered   05/22/21 1709  Do not attempt resuscitation (DNR)  Continuous       Question Answer Comment  In the event of cardiac or respiratory ARREST Do not call a code blue   In the event of cardiac or respiratory ARREST Do not perform Intubation, CPR, defibrillation or ACLS   In the event of cardiac or respiratory ARREST Use medication by any route, position, wound care, and other measures to relive pain and suffering. May use oxygen, suction and manual treatment of airway obstruction as needed for comfort.      05/22/21 1711           Code Status History     Date Active Date Inactive Code Status Order ID Comments User Context   05/22/2021 1604 05/22/2021 1711 Full Code [id number]  [name], MD ED   02/21/2021 0648 03/03/2021 2347 Full Code [id number]  Ivor Costa, MD ED   02/20/2021 1801 02/21/2021 0648 DNR [id number]  Ivor Costa, MD ED   12/06/2020 2258 12/16/2020 0713 Full Code [id number]  Tacey Ruiz, MD Inpatient   10/03/2020 0055 10/06/2020 2346 DNR [id number]  Vernelle Emerald, MD ED   10/03/2020 0055 10/03/2020 0055 DNR [id number]  Vernelle Emerald, MD ED   09/05/2020 1039 09/07/2020 2311 DNR [id number]  Terrilee Croak, MD Inpatient   09/05/2020 0258 09/05/2020 1038 Full Code [id number]  Dwyane Dee, MD ED   07/31/2020 1758 08/04/2020 0323 Full Code [id number]  Bonnielee Haff, MD ED   10/08/2019 1815 10/09/2019 2042 Full Code [id number]  Cherylann Ratel A, DO Inpatient   04/08/2019 1316 04/12/2019 2132 Full Code [id number]  [name], DO ED   08/10/2018 1642 08/15/2018 1737 DNR [id number]  Annita Brod, MD Inpatient   01/14/2016 0946 01/18/2016 2226 DNR [id number]  Dory Horn, NP Inpatient   01/08/2016 1522 01/14/2016 0946  Full Code [id number]  Smiley Houseman, MD Inpatient   10/19/2015 2054 10/22/2015 1646 DNR [id number]  Aquilla Hacker, MD Inpatient   12/14/2014 1236 12/15/2014 1710 Full Code [id number]  Newt Minion, MD Inpatient   10/16/2014 1036 10/20/2014 1813 Full Code [id number]  Leone Brand, MD Inpatient   03/13/2014 0323 03/14/2014 2132 Full Code [id number]  Hilton Sinclair, MD Inpatient       Prognosis:  Unable to determine  Discharge Planning: Group home with palliative.   Care plan was discussed with  IDT  Thank you for allowing the Palliative Medicine Team to assist in the care of this patient.   Time In:  10 Time Out: 10.15 Total Time 15 Prolonged Time Billed No       Greater than 50%  of this time was spent counseling and coordinating care related to the above assessment and plan.  Loistine Chance, MD  Please contact Palliative Medicine Team phone at [phone] for questions and concerns.

## 2021-06-04 NOTE — Procedures (Signed)
PROCEDURE SUMMARY:  Successful US guided left thoracentesis. Yielded 1 L of clear yellow fluid. Patient tolerated procedure well. No immediate complications. EBL = trace  Post procedure chest X-ray pending.  Roberts Bon S Shelaine Frie PA-C 06/04/2021 3:26 PM

## 2021-06-04 NOTE — Progress Notes (Signed)
PROGRESS NOTE  [name] [id number] DOB: 03/11/1961 DOA: 05/22/2021 PCP: Audley Hose, MD  HPI/Recap of past 24 hours:  [name] is a 61 y.o. male with medical history significant of chronic diastolic CHF, right foot amputee, iron deficiency anemia, anxiety, bipolar disorder, intellectual functional disability, obsessive-compulsive disorder, cerebral palsy, chronic bronchitis, constipation, environmental allergies, gastric ulcer with history of UGI bleed necessitating blood transfusion, GERD, hiatal hernia, history of varicella-zoster, hyperlipidemia, osteomyelitis of right foot, history of pneumonia, pressure ulcer of sacral area, history of Pseudomonas infection, recurrent UTIs, seborrheic dermatitis, vitamin D deficiency, functional quadriplegia who was brought from his group home due to productive cough for about a week.  Work up revealed community-acquired pneumonia with concern for possible aspiration, also revealed large B/L pleural effusions.  He was started on Rocephin and azithromycin empirically.  Post R thoracentesis by IR on 12/29 and post L thoracentesis by IR on 12/30.  06/03/2021: Patient is somnolent today, recently medicated for pain.  Says that overall abdominal pain is better with medications.  Shallow breaths.    Assessment/Plan: Principal Problem:   Acute dyspnea Active Problems:   Infantile cerebral palsy (HCC)   Pressure ulcer of foot   Functional quadriplegia (HCC)   Stage 3 skin ulcer of sacral region (HCC)   Microcytic anemia   Lactic acidosis   Iron deficiency anemia   Protein-calorie malnutrition, severe   Chronic diastolic CHF (congestive heart failure) (HCC)   Malnutrition of moderate degree  Resolved acute dyspnea secondary to community-acquired pneumonia with concern for recurrent aspiration and large B/L pleural effusions, s/p bilateral thoracentesis by IR. Speech therapist evaluated, recs reg consistency and thin fluids with  aspiration precautions. Maintain oxygen saturation greater 92% No longer requiring oxygen supplementation post bilateral thoracentesis. Treat underlying conditions Completed 5 days of azithromycin Completed 5 days of Rocephin MRSA screening test positive Repeat imaging on 1/8 showed persistent pleural effusions Underwent repeat left thoracentesis 1/9  Sacral Pressure wounds, present on admission Continue local wound care with wound care specialist guidance. Patient does have some erythema around his wound, no drainage appreciated.  We will start Ancef and likely transition to oral antibiotics on discharge. Continue pain control   Acute portal vein, superior mesenteric vein, splenic vein thrombosis -Patient was complaining of persistent abdominal pain -Found to have thrombosis in abdominal vessels -Started on IV heparin  Tachybradycardia syndrome suggesting underlying sinus node dysfunction -Noted on telemetry to have periods of atrial tachycardia as well as high degree AV block and possible complete heart block -He has been having periods of transient decrease in responsiveness which may be co-related -TSH checked and noted to be normal -Potassium and magnesium also normal -VBG did not show significant hypercarbia. -Seen by cardiology who did not recommend pacemaker.  Avoiding AV nodal agents was recommended  Hyperammonemia -Ammonia level 90 -He was started on lactulose -Depakote dose was held, level was checked and noted to be subtherapeutic -Follow-up level noted to be 40   Chronic diastolic CHF Continue strict I's and O's and daily weight  Infantile cerebral palsy Functional quadriplegia Muscle relaxers as needed Analgesics as needed  Iron deficiency anemia Hemoglobin has been stable Iron 19, ferritin 102  Thrombocytosis -Likely reactive to underlying abdominal vein thrombosis  Severe protein calorie malnutrition Continue protein supplementation  Chronic  constipation Continue senokot 2 tabs bid, miralax bid Dulcolax sup every other day Smog enema 12/30 Continue bowel regimen Despite having multiple bowel movements, CT imaging from 1/9 shows moderate volume of  stool in colon  Chronic indwelling foley catheter Facility confirms that patient has a chronic foley catheter Continue Flomax   Goals of care DNR Palliative care team consulted to assist with establishing goals of care Patient has had difficulty to manage pain -He is routinely complaining of significant abdominal pain as well as pain in his sacral wound and visibly appears uncomfortable -When he is given pain medication, he frequently becomes lethargic -With underlying cardiac issues including sinus node dysfunction, thrombosis of abdominal veins requiring anticoagulation, persistent sacral wound which appears to be nonhealing, hypoalbuminemia and poor nutritional status functional status, his long-term prognosis appears to be very poor -I had an honest discussion with his legal guardian/sister Ms. [name] Fulcher and expressed my concerns regarding his overall quality of life/pain management and expected prognosis -I have recommended that she consider hospice for better Pain control and focus on his overall quality of life -[name] appears to be in agreement with this and would be interested in moving [place] to a residential hospice facility for end-of-life care -We will discuss with palliative medicine tomorrow regarding adjusting his pain management.   Code Status: DNR  Family Communication: Updated patient's guardian/sister, Ms. Cinda Quest  Disposition Plan: Likely return to group home.   Consultants: Palliative care team  Procedures: None  Antimicrobials: Rocephin Azithromycin Ancef 1/9 >  DVT prophylaxis: Heparin infusion  Status is: Inpatient  Patient requires at least 2 midnights for further evaluation and treatment.      Objective: Vitals:   06/04/21  0310 06/04/21 0330 06/04/21 0635 06/04/21 1139  BP: 121/82 (!) 94/45 (!) 150/65 (!) 103/53  Pulse:   74 63  Resp:   16 20  Temp:   99.1 F (37.3 C) 98.3 F (36.8 C)  TempSrc:   Oral Oral  SpO2:   97% 96%  Weight:      Height:        Intake/Output Summary (Last 24 hours) at 06/04/2021 2037 Last data filed at 06/04/2021 1758 Gross per 24 hour  Intake 2385.35 ml  Output 975 ml  Net 1410.35 ml   Filed Weights   05/31/21 0500 06/03/21 0500 06/03/21 0628  Weight: 96.9 kg 99.8 kg 99.8 kg    Exam:  General exam: Sleeping on my arrival, he is somnolent today Respiratory system: Diminished breath sounds at bases. short shallow breaths. Cardiovascular system:RRR. No murmurs, rubs, gallops. Gastrointestinal system: Abdomen is distended, soft and diffusely tender. No organomegaly or masses felt. Normal bowel sounds heard. Central nervous system: Functional quadriplegia Extremities: Right lower extremity amputation Skin: Right sacral ulcer, present on admission Psychiatry:  somnolent, falls asleep in conversation    Data Reviewed: CBC: Recent Labs  Lab 05/31/21 0400 06/01/21 0404 06/02/21 0402 06/03/21 1435 06/04/21 0352  WBC 10.2 10.6* 9.4 11.5* 10.3  HGB 9.0* 9.1* 9.4* 10.0* 9.1*  HCT 27.7* 29.6* 29.9* 32.8* 29.1*  MCV 73.5* 75.7* 76.1* 76.8* 76.4*  PLT 780* 782* 847* 903* [id number]   Basic Metabolic Panel: Recent Labs  Lab 05/29/21 0631 05/31/21 0400 06/01/21 0404 06/02/21 0402 06/03/21 1433 06/03/21 1435 06/04/21 0352  NA 133* 133* 136 132*  --  132* 133*  K 4.0 4.1 4.4 4.6  --  4.5 3.9  CL 101 103 100 99  --  99 98  CO2 27 25 26 26   --  25 29  GLUCOSE 89 87 94 86  --  233* 104*  BUN 18 19 20 14   --  17 16  CREATININE 0.34* 0.33* 0.42* 0.30*  --  0.39* <0.30*  CALCIUM 8.6* 8.3* 8.8* 8.5*  --  9.1 8.6*  MG 2.2  --   --   --  2.0  --   --    GFR: CrCl cannot be calculated (This lab value cannot be used to calculate CrCl because it is not a number: <0.30). Liver  Function Tests: Recent Labs  Lab 06/01/21 0404 06/03/21 1435 06/04/21 0352  AST 22 26 20   ALT 22 26 21   ALKPHOS 159* 206* 169*  BILITOT 0.3 0.4 0.4  PROT 6.2* 7.1 6.1*  ALBUMIN 2.0* 2.2* 2.0*    No results for input(s): LIPASE, AMYLASE in the last 168 hours. Recent Labs  Lab 06/03/21 1435 06/04/21 0352  AMMONIA 94* 40*   Coagulation Profile: No results for input(s): INR, PROTIME in the last 168 hours. Cardiac Enzymes: No results for input(s): CKTOTAL, CKMB, CKMBINDEX, TROPONINI in the last 168 hours. BNP (last 3 results) No results for input(s): PROBNP in the last 8760 hours. HbA1C: No results for input(s): HGBA1C in the last 72 hours. CBG: Recent Labs  Lab 05/28/21 2058  GLUCAP 131*   Lipid Profile: No results for input(s): CHOL, HDL, LDLCALC, TRIG, CHOLHDL, LDLDIRECT in the last 72 hours. Thyroid Function Tests: Recent Labs    06/03/21 1613  TSH 1.311   Anemia Panel: Recent Labs    06/03/21 1613  FERRITIN 102  TIBC 322  IRON 19*   Urine analysis:    Component Value Date/Time   COLORURINE YELLOW (A) 02/20/2021 1432   APPEARANCEUR CLEAR (A) 02/20/2021 1432   LABSPEC >1.046 (H) 02/20/2021 1432   PHURINE 6.0 02/20/2021 1432   GLUCOSEU NEGATIVE 02/20/2021 1432   HGBUR NEGATIVE 02/20/2021 1432   HGBUR negative 06/14/2010 1119   BILIRUBINUR NEGATIVE 02/20/2021 1432   BILIRUBINUR SMALL 01/04/2016 1634   KETONESUR NEGATIVE 02/20/2021 1432   PROTEINUR 100 (A) 02/20/2021 1432   UROBILINOGEN 2.0 (H) 02/05/2018 1825   NITRITE NEGATIVE 02/20/2021 1432   LEUKOCYTESUR NEGATIVE 02/20/2021 1432   Sepsis Labs: @LABRCNTIP (procalcitonin:4,lacticidven:4)  ) No results found for this or any previous visit (from the past 240 hour(s)).     Studies: DG Chest 1 View  Result Date: 06/04/2021 CLINICAL DATA:  Post left thoracentesis EXAM: CHEST  1 VIEW COMPARISON:  05/30/2021, CT 06/03/2021 FINDINGS: Low lung volumes. Mild cardiomegaly. Improved aeration on the left  with decreased pleural effusion, small residual. No pneumothorax is seen. Patchy airspace disease at left lung base and within the right thorax. IMPRESSION: 1. Improved aeration on the left with decreased pleural effusion, no pneumothorax. 2. Residual bilateral effusions with patchy airspace disease in right thorax and at left base, suggestive of pneumonia. Electronically Signed   By: [name] Foil M.D.   On: 06/04/2021 15:48   CT CHEST ABDOMEN PELVIS W CONTRAST  Result Date: 06/04/2021 CLINICAL DATA:  Sepsis.  Abdominal distension. EXAM: CT CHEST, ABDOMEN, AND PELVIS WITH CONTRAST TECHNIQUE: Multidetector CT imaging of the chest, abdomen and pelvis was performed following the standard protocol during bolus administration of intravenous contrast. CONTRAST:  80mL OMNIPAQUE IOHEXOL 350 MG/ML SOLN IV for abdominal exam. Due to technical error, the patient returned for chest CT after abdominal exam is performed, an additional 60 cc Omnipaque 350 IV was administered. COMPARISON:  Recent chest radiographs, chest CT 04/22/2021, abdominal CT 02/20/2021 FINDINGS: CT CHEST FINDINGS Cardiovascular: Normal heart size with coronary artery calcifications. Tortuous aorta without acute findings. Prominent main pulmonary artery at 3 cm, unchanged. No pericardial effusion. Mediastinum/Nodes: Scattered small mediastinal lymph nodes  are not enlarged by size criteria. Moderate-sized hiatal hernia. Wall thickening of the distal esophagus and the intrathoracic stomach. Lungs/Pleura: Moderate right and large left pleural effusion with associated compressive atelectasis. No significant change from prior exam. Breathing motion artifact limits assessment mild perifissural atelectasis. No convincing pulmonary edema. Musculoskeletal: Exaggerated thoracic kyphosis. Multilevel spondylosis. There are no acute or suspicious osseous abnormalities. Minor chest wall edema. CT ABDOMEN PELVIS FINDINGS Hepatobiliary: Motion artifact through the liver.  11 mm cyst in the hepatic dome. The gallbladder is distended. Layering stones or sludge in the gallbladder. Expansile filling defect within the main portal vein, new from prior abdominal CT. Multiple collaterals at the porta hepatis and in the upper abdomen. Common bile duct is difficult to define. No obvious common bile duct dilatation. Central periportal edema. Pancreas: Motion artifact through the pancreas significantly limits assessment. Parenchymal atrophy. No obvious peripancreatic inflammation. Spleen: Normal in size without focal abnormality. The portal vein is not well-defined and may be occluded, although there is motion artifact through this region limiting assessment. Adrenals/Urinary Tract: No adrenal nodule. No hydronephrosis. No renal stone or focal renal abnormality. Symmetric renal excretion on delayed phase imaging. Foley catheter decompresses the urinary bladder. There is diffuse bladder wall thickening, greater than typically seen with bladder nondistention. Stomach/Bowel: Moderate to large hiatal hernia. There are perigastric varices. No small bowel dilatation. No obvious small bowel inflammation. Motion artifact and lack of enteric contrast limits bowel assessment. There perirectal varices. Colonic tortuosity with moderate volume of stool. Mild cecal distention. The appendix is not visualized. Vascular/Lymphatic: Expansile thrombus in the main portal vein and superior mesenteric vein. Suspect thrombus in the splenic vein, though not well-defined. There are multiple portosystemic collaterals, perirectal and perigastric varices. Normal caliber abdominal aorta. No bulky abdominopelvic adenopathy, although assessment for enlarged lymph nodes is limited due to motion. Reproductive: Prominent prostate gland. Other: Small to moderate volume abdominopelvic ascites. Generalized mesenteric edema. No free air. Mild generalized body wall edema. Musculoskeletal: Sacral decubitus ulcer. No obvious sacral  destruction or periosteal reaction. Slight chronic deformity of the hips. No acute osseous abnormalities are seen. IMPRESSION: 1. 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 Suspect thrombus in the splenic vein, though not well-defined. Multiple portosystemic collaterals, perirectal and perigastric varices. 2. Moderate right and large left pleural effusion with associated compressive atelectasis. 3. Small to moderate volume abdominopelvic ascites. Generalized mesenteric edema. 4. Sacral decubitus ulcer without obvious sacral destruction or periosteal reaction. 5. Distended gallbladder with layering stones or sludge in the gallbladder. 6. Diffuse bladder wall thickening, greater than typically seen with bladder nondistention. Recommend correlation with urinalysis to exclude urinary tract infection. 7. Moderate to large hiatal hernia. Wall thickening of the distal esophagus and intrathoracic stomach, can be seen with reflux or esophagitis. Aortic Atherosclerosis (ICD10-I70.0). These results will be called to the ordering clinician or representative by the Radiologist Assistant, and communication documented in the PACS or [employer]. Electronically Signed   By: Keith Rake M.D.   On: 06/04/2021 01:09    Scheduled Meds:  (feeding supplement) PROSource Plus  30 mL Oral Daily   vitamin C  250 mg Oral Daily   baclofen  20 mg Oral TID   bisacodyl  10 mg Rectal QODAY   calcium carbonate  3 tablet Oral Q breakfast   chlorhexidine  15 mL Mouth Rinse BID   Chlorhexidine Gluconate Cloth  6 each Topical Daily   cholecalciferol  5,000 Units Oral Daily   clotrimazole  1 application Topical BID   diclofenac Sodium  4 g Topical QID   divalproex  500 mg Oral BID   feeding supplement  237 mL Oral BID BM   fluticasone  1 spray Each Nare Daily   hydrocortisone cream  1 application Topical BID   iron polysaccharides  150 mg Oral Q1200   ketoconazole  1 application Topical  Once per day on Mon Thu   lactulose  20 g Oral TID   lisinopril  2.5 mg Oral Daily   loratadine  10 mg Oral Daily   mouth rinse  15 mL Mouth Rinse BID   oxybutynin  5 mg Oral TID   pantoprazole  40 mg Oral Daily   polyethylene glycol  17 g Oral BID   risperiDONE  2 mg Oral QHS   senna-docusate  2 tablet Oral BID   tamsulosin  0.4 mg Oral QPC supper    Continuous Infusions:   ceFAZolin (ANCEF) IV 2 g (06/04/21 1459)   heparin 1,600 Units/hr (06/04/21 0803)       LOS: 12 days     Kathie Dike, MD [facility]   If 7PM-7AM, please contact night-coverage [email or web address]  06/04/2021, 8:37 PM

## 2021-06-04 NOTE — Consult Note (Signed)
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  Cardiology is asked to evaluate for sinus pauses.  HPI   [name] is a 61 y.o. male who is being seen today for the evaluation of sinus pause at the request of Dr. Kerry Hough. This is a patient previously seen by Dr. Gwen Pounds at the [place] [facility] with a history as described above including cerebral palsy with quadriplegia, right BKA, who was recently admitted with pneumonia complicated by COVID-19 and severe sepsis in September 2022 at [facility].  He had a chest CT which showed possible right atrial thrombus.  Echocardiogram was performed however which showed no evidence of thrombus.  The patient was also not thought to be an anticoagulation candidate.  He was recently admitted at [facility] for fever and signs and symptoms concerning for pneumonia with overlying pleural effusions.  He was placed on azithromycin and Rocephin and underwent thoracentesis bilaterally by IR on December 29 and December 30.  He has been seen by palliative care and is DNR.  Review of his cardiac monitor here demonstrates sinus pauses, complete heart block and high degree Mobitz 2 block with intermittent episodes of what appears to be atrial tachycardia with heart rates in the 140s to 150s.  PMHx   Past Medical History:  Diagnosis Date   Acute on chronic diastolic  CHF (congestive heart failure) (HCC) 05/22/2021   Amputee 06/2020   right foot   Anemia    microcytic anemia    Anxiety    takes Ativan daily as needed   Bipolar disorder (HCC)    takes Depakote daily   CEREBRAL PALSY 07/24/2006   Chronic bronchitis (HCC)    "gets it q yr"   Chronic bronchitis (HCC)    Chronic diabetic ulcer of foot determined by examination [facility])    Constipation    takes Miralax daily as needed   Depression    takes Risperdal nightly   Environmental allergies    takes Zyrtec daily   GASTRIC ULCER ACUTE WITH HEMORRHAGE 07/24/2006   Gastric ulcer with hemorrhage    hx of    GERD (gastroesophageal reflux disease)    takes Pravacid daily   History of blood transfusion    no abnormal reaction noted   History of hiatal hernia    History of shingles    History of stomach ulcers ~ 1996   Hypercholesterolemia    Intellectual functioning disability    Intermittent explosive disorder    Internal hemorrhoids    MRSA infection 09/06/2019   Obsessive compulsive disorder    Osteomyelitis (HCC)    right foot    Pneumonia "quite a few times"   in 2015 and in June 2016   Pressure ulcer of sacral region    hx of    Pseudomonas aeruginosa infection 09/06/2019   Psychotic disorder (HCC)    Recurrent UTI (urinary tract infection)    Seborrheic dermatitis    Severe sepsis (HCC)    Sigmoid diverticulosis  Spastic tetraplegia (HCC)    URINARY INCONTINENCE 02/09/2010   takes Detrol daily-occasionally incontinence   Vitamin D deficiency    Weakness    numbness in extremities    Past Surgical History:  Procedure Laterality Date   AMPUTATION Right 12/14/2014   Procedure: AMPUTATION DIGIT RIGHT GREAT TOE MTP;  Surgeon: Nadara Mustard, MD;  Location: [facility] OR;  Service: Orthopedics;  Laterality: Right;   AMPUTATION Right 10/08/2019   Procedure: RIGHT FIFTH RAY AMPUTATION, ADJACENT TISSUE TRANSFER;  Surgeon: Park Liter, DPM;  Location: [facility];  Service: Podiatry;   Laterality: Right;   AMPUTATION Right 07/27/2020   Procedure: AMPUTATION BELOW KNEE;  Surgeon: Toni Arthurs, MD;  Location: [facility] OR;  Service: Orthopedics;  Laterality: Right;    BIOPSY  08/12/2018   Procedure: BIOPSY;  Surgeon: Lynann Bologna, MD;  Location: [facility];  Service: Gastroenterology;;   COLONOSCOPY WITH PROPOFOL N/A 08/14/2018   Procedure: COLONOSCOPY WITH PROPOFOL;  Surgeon: Lynann Bologna, MD;  Location: [facility];  Service: Gastroenterology;  Laterality: N/A;   ESOPHAGOGASTRODUODENOSCOPY (EGD) WITH PROPOFOL N/A 08/12/2018   Procedure: ESOPHAGOGASTRODUODENOSCOPY (EGD) WITH PROPOFOL;  Surgeon: Lynann Bologna, MD;  [facility];  Service: Gastroenterology;  Laterality: N/A;   FLEXIBLE SIGMOIDOSCOPY N/A 11/09/2020   Procedure: FLEXIBLE SIGMOIDOSCOPY;  Surgeon: [name], MD;  Location: [facility];  Service: Gastroenterology;  Laterality: N/A;   GRAFT APPLICATION Right 11/12/2019   Procedure: SKIN GRAFT SUBSTITUTE APPLICATION;  Surgeon: Park Liter, DPM;  Location: [facility];  Service: Podiatry;  Laterality: Right;   HERNIA REPAIR     HIATAL HERNIA REPAIR  1982   INCISION AND DRAINAGE ABSCESS N/A 12/08/2020   Procedure: INCISION AND DRAINAGE SACRAL DECUBITUS;  Surgeon: Sheliah Hatch De Blanch, MD;  Location: [facility];  Service: General;  Laterality: N/A;  45 MIN   LEG SURGERY Bilateral ~ 1967   "legs were scissored; stretched them out"   WOUND DEBRIDEMENT Right 11/12/2019   Procedure: DEBRIDEMENT WOUND;  Surgeon: Park Liter, DPM;  Location: [facility];  Service: Podiatry;  Laterality: Right;    FAMHx   Family History  Adopted: Yes  Problem Relation Age of Onset   Colon cancer Neg Hx     SOCHx    reports that he has never smoked. He has never used smokeless tobacco. He reports that he does not drink alcohol and does not use drugs.  Outpatient Medications   No current facility-administered medications on file prior to encounter.   Current Outpatient  Medications on File Prior to Encounter  Medication Sig Dispense Refill   acetaminophen (TYLENOL) 500 MG tablet Take 500 mg by mouth every 6 (six) hours as needed (for aches, pains, and/or fevers).     albuterol (VENTOLIN HFA) 108 (90 Base) MCG/ACT inhaler Inhale 2 puffs into the lungs every 4 (four) hours as needed for shortness of breath or wheezing.     alum & mag hydroxide-simeth (MAALOX/MYLANTA) 200-200-20 MG/5ML suspension Take 15-30 mLs by mouth every 6 (six) hours as needed for indigestion or heartburn (or nausea).     baclofen (LIORESAL) 20 MG tablet Take 1 tablet (20 mg total) by mouth 3 (three) times daily. 90 each 1   bisacodyl (DULCOLAX) 10 MG suppository Place 10 mg rectally every other day.     bisacodyl (DULCOLAX) 5 MG EC tablet Take 1 tablet (5 mg total) by mouth daily as needed for moderate constipation. 30 tablet 0   brompheniramine-pseudoephedrine (DIMETAPP) 1-15 MG/5ML ELIX Take 5 mLs by mouth 2 (  two) times daily as needed (cold symptoms).     cetirizine (ZYRTEC) 10 MG tablet Take 1 tablet (10 mg total) by mouth daily. 30 tablet 3   Cholecalciferol (VITAMIN D) 125 MCG (5000 UT) CAPS Take 5,000 Units by mouth daily.     clotrimazole (LOTRIMIN) 1 % cream Apply 1 application topically 2 (two) times daily.     diclofenac Sodium (VOLTAREN) 1 % GEL Apply 4 g topically See admin instructions. Apply 4 grams to painful areas four times a day (Patient taking differently: Apply 4 g topically 4 (four) times daily.)     divalproex (DEPAKOTE SPRINKLE) 125 MG capsule Take 500 mg by mouth 2 (two) times daily. (0700 & 2000)     Ensure Plus (ENSURE PLUS) LIQD Take 237 mLs by mouth 2 (two) times daily between meals. Chocolate     FERREX 150 150 MG capsule Take 150 mg by mouth daily at 12 noon.     fluticasone (FLONASE) 50 MCG/ACT nasal spray Place 1 spray into both nostrils daily. (0700)     Foot Care Products (PREVALON HEEL PROTECTOR) MISC 1 Device by Does not apply route daily. (Patient taking  differently: 1 Device by Does not apply route at bedtime.) 2 each 0   guaiFENesin-dextromethorphan (ROBITUSSIN DM) 100-10 MG/5ML syrup Take 10 mLs by mouth every 4 (four) hours as needed for cough.      hydrocortisone cream 1 % Apply 1 application topically 2 (two) times daily.     hydrocortisone cream 1 % Apply 1 application topically 2 (two) times daily.     ketoconazole (NIZORAL) 2 % shampoo Apply 1 application topically 2 (two) times a week.     lactulose (CHRONULAC) 10 GM/15ML solution Take 15 mLs (10 g total) by mouth daily. (Patient taking differently: Take 15 g by mouth daily.) 236 mL 0   liver oil-zinc oxide (DESITIN) 40 % ointment Apply 1 application topically 2 (two) times daily as needed (unspecified). (Patient taking differently: Apply 1 application topically 2 (two) times daily as needed for irritation.) 56.7 g 0   LORazepam (ATIVAN) 2 MG tablet Take 1 mg by mouth every 4 (four) hours as needed for anxiety.     neomycin-bacitracin-polymyxin (NEOSPORIN) [phone] ointment Apply 1 application topically daily as needed (cuts, scrapes).     omeprazole (PRILOSEC) 40 MG capsule Take 40 mg by mouth 2 (two) times daily.     ondansetron (ZOFRAN) 4 MG tablet Take 1 tablet (4 mg total) by mouth every 6 (six) hours as needed for nausea. 20 tablet 0   Oyster Shell 500 MG TABS Take 1,500 mg by mouth daily.     polyethylene glycol powder (GLYCOLAX/MIRALAX) 17 GM/SCOOP powder Take 17 g by mouth 2 (two) times daily. Mix 17 grams in 8 oz of beverage of choice and take by mouth.     risperiDONE (RISPERDAL) 2 MG tablet Take 2 mg by mouth at bedtime.     SANTYL ointment Apply 1 application topically See admin instructions. Apply to wound in the sacral area once a day     senna-docusate (SENOKOT-S) 8.6-50 MG tablet Take 1 tablet by mouth at bedtime.     sodium hypochlorite (DAKIN'S 1/4 STRENGTH) 0.125 % SOLN Irrigate with 1 application as directed 2 (two) times daily.     tolterodine (DETROL LA) 2 MG 24 hr  capsule Take 2 mg by mouth daily.     tretinoin (RETIN-A) 0.025 % gel Apply 1 application topically at bedtime. Apply to the affected areas of  the face.     vitamin C (ASCORBIC ACID) 250 MG tablet Take 250 mg by mouth daily.     Zinc Oxide (DESITIN) 40 % PSTE Apply 1 application topically daily as needed (to buttocks and scrotum).     zinc oxide 20 % ointment Apply 1 application topically daily as needed for irritation (for maceration to periwound with each dressing change).     HYDROcodone-acetaminophen (NORCO/VICODIN) 5-325 MG tablet Take 1 tablet by mouth every 4 (four) hours as needed for severe pain. (Patient not taking: Reported on 05/22/2021) 10 tablet 0    Inpatient Medications    Scheduled Meds:  (feeding supplement) PROSource Plus  30 mL Oral Daily   vitamin C  250 mg Oral Daily   baclofen  20 mg Oral TID   bisacodyl  10 mg Rectal QODAY   calcium carbonate  3 tablet Oral Q breakfast   chlorhexidine  15 mL Mouth Rinse BID   Chlorhexidine Gluconate Cloth  6 each Topical Daily   cholecalciferol  5,000 Units Oral Daily   clotrimazole  1 application Topical BID   diclofenac Sodium  4 g Topical QID   feeding supplement  237 mL Oral BID BM   fluticasone  1 spray Each Nare Daily   hydrocortisone cream  1 application Topical BID   iron polysaccharides  150 mg Oral Q1200   ketoconazole  1 application Topical Once per day on Mon Thu   lactulose  20 g Oral TID   lisinopril  2.5 mg Oral Daily   loratadine  10 mg Oral Daily   mouth rinse  15 mL Mouth Rinse BID   oxybutynin  5 mg Oral TID   pantoprazole  40 mg Oral Daily   polyethylene glycol  17 g Oral BID   risperiDONE  2 mg Oral QHS   senna-docusate  2 tablet Oral BID   tamsulosin  0.4 mg Oral QPC supper    Continuous Infusions:   ceFAZolin (ANCEF) IV 2 g (06/04/21 0532)   heparin 1,600 Units/hr (06/04/21 0803)    PRN Meds: acetaminophen **OR** acetaminophen, albuterol, alum & mag hydroxide-simeth, bisacodyl,  guaiFENesin-dextromethorphan, HYDROmorphone (DILAUDID) injection, liver oil-zinc oxide, LORazepam, ondansetron **OR** ondansetron (ZOFRAN) IV, oxyCODONE   ALLERGIES   Allergies  Allergen Reactions   Adhesive [Tape] Rash    ROS   Pertinent items noted in HPI and remainder of comprehensive ROS otherwise negative.  Vitals   Vitals:   06/03/21 0628 06/03/21 1106 06/03/21 2220 06/04/21 0635  BP: 136/78 115/69 (!) 117/58 (!) 150/65  Pulse: 96 76 (!) 57 74  Resp: 18 (!) 21 18 16   Temp: 98.2 F (36.8 C) 98.6 F (37 C) 98.5 F (36.9 C) 99.1 F (37.3 C)  TempSrc: Oral Oral Oral Oral  SpO2:  95% 96% 97%  Weight: 99.8 kg     Height: 6' (1.829 m)       Intake/Output Summary (Last 24 hours) at 06/04/2021 1117 Last data filed at 06/04/2021 0952 Gross per 24 hour  Intake 1767.44 ml  Output 910 ml  Net 857.44 ml   Filed Weights   05/31/21 0500 06/03/21 0500 06/03/21 0628  Weight: 96.9 kg 99.8 kg 99.8 kg    Physical Exam   General appearance: alert, cachectic, pale, and ill-appearing, contracted Neck: no carotid bruit, no JVD, and thyroid not enlarged, symmetric, no tenderness/mass/nodules Lungs: diminished breath sounds bilaterally and rhonchi bilaterally Heart: regular rate and rhythm Abdomen: soft, non-tender; bowel sounds normal; no masses,  no organomegaly  Extremities: no edema of the RLE, s/p R BKA Pulses: 1+ pulses Skin: pale Neurologic: Mental status: awake, oriented, functional quad Psych: Pleasant  Labs   Results for orders placed or performed during the hospital encounter of 05/22/21 (from the past 48 hour(s))  Magnesium     Status: None   Collection Time: 06/03/21  2:33 PM  Result Value Ref Range   Magnesium 2.0 1.7 - 2.4 mg/dL    Comment: Performed at [facility], [address], [place], [place] [address]  CBC     Status: Abnormal   Collection Time: 06/03/21  2:35 PM  Result Value Ref Range   WBC 11.5 (H) 4.0 - 10.5 K/uL   RBC 4.27 4.22  - 5.81 MIL/uL   Hemoglobin 10.0 (L) 13.0 - 17.0 g/dL   HCT 82.9 (L) 56.2 - 13.0 %   MCV 76.8 (L) 80.0 - 100.0 fL   MCH 23.4 (L) 26.0 - 34.0 pg   MCHC 30.5 30.0 - 36.0 g/dL   RDW 86.5 (H) 78.4 - 69.6 %   Platelets 903 (HH) 150 - 400 K/uL    Comment: REPEATED TO VERIFY THIS CRITICAL RESULT HAS VERIFIED AND BEEN CALLED TO [facility],D BY PATRICIA LUZOLO ON 01 08 2023 AT 1509, AND HAS BEEN READ BACK. CRITICAL RESULT VERIFIED    nRBC 0.0 0.0 - 0.2 %    Comment: Performed at [facility], [address] [place], [place] [address]  Comprehensive metabolic panel     Status: Abnormal   Collection Time: 06/03/21  2:35 PM  Result Value Ref Range   Sodium 132 (L) 135 - 145 mmol/L   Potassium 4.5 3.5 - 5.1 mmol/L   Chloride 99 98 - 111 mmol/L   CO2 25 22 - 32 mmol/L   Glucose, Bld 233 (H) 70 - 99 mg/dL    Comment: Glucose reference range applies only to samples taken after fasting for at least 8 hours.   BUN 17 8 - 23 mg/dL   Creatinine, Ser 4.13 (L) 0.61 - 1.24 mg/dL   Calcium 9.1 8.9 - 24.4 mg/dL   Total Protein 7.1 6.5 - 8.1 g/dL   Albumin 2.2 (L) 3.5 - 5.0 g/dL   AST 26 15 - 41 U/L   ALT 26 0 - 44 U/L   Alkaline Phosphatase 206 (H) 38 - 126 U/L   Total Bilirubin 0.4 0.3 - 1.2 mg/dL   GFR, Estimated >01 >02 mL/min    Comment: (NOTE) Calculated using the CKD-EPI Creatinine Equation (2021)    Anion gap 8 5 - 15    Comment: Performed at [facility], [address], [place], [place] [address]  Ammonia     Status: Abnormal   Collection Time: 06/03/21  2:35 PM  Result Value Ref Range   Ammonia 94 (H) 9 - 35 umol/L    Comment: Performed at [facility], [address], [place], [place] [address]  Iron and TIBC     Status: Abnormal   Collection Time: 06/03/21  4:13 PM  Result Value Ref Range   Iron 19 (L) 45 - 182 ug/dL   TIBC 474 259 - 563 ug/dL   Saturation Ratios 6 (L) 17.9 - 39.5 %   UIBC 303 ug/dL    Comment: Performed at [facility], [address], [place], [place] [address]  Ferritin     Status: None   Collection Time: 06/03/21  4:13 PM  Result Value Ref  Range   Ferritin 102 24 - 336 ng/mL    Comment: Performed at [facility], [address] [place], [place] [address]  Blood gas, venous     Status: Abnormal   Collection Time: 06/03/21  4:13 PM  Result Value Ref Range   pH, Ven 7.346 7.250 - 7.430   pCO2, Ven 53.9 44.0 - 60.0 mmHg   pO2, Ven 37.2 32.0 - 45.0 mmHg   Bicarbonate 28.7 (H) 20.0 - 28.0 mmol/L   Acid-Base Excess 2.9 (H) 0.0 - 2.0 mmol/L   O2 Saturation 63.0 %   Patient temperature 98.6     Comment: Performed at [facility], [address], [place], [place] [address]  TSH     Status: None   Collection Time: 06/03/21  4:13 PM  Result Value Ref Range   TSH 1.311 0.350 - 4.500 uIU/mL    Comment: Performed by a 3rd Generation assay with a functional sensitivity of <=0.01 uIU/mL. Performed at [facility], [address] [place], [place] [address]   Valproic acid level     Status: Abnormal   Collection Time: 06/03/21  9:42 PM  Result Value Ref Range   Valproic Acid Lvl 17 (L) 50.0 - 100.0 ug/mL    Comment: Performed at [facility], [address], [place], [place] [address]  CBC     Status: Abnormal   Collection Time: 06/04/21  3:52 AM  Result Value Ref Range   WBC 10.3 4.0 - 10.5 K/uL   RBC 3.81 (L) 4.22 - 5.81 MIL/uL   Hemoglobin 9.1 (L) 13.0 - 17.0 g/dL   HCT 65.7 (L) 84.6 - 96.2 %   MCV 76.4 (L) 80.0 - 100.0 fL   MCH 23.9 (L) 26.0 - 34.0 pg   MCHC 31.3 30.0 - 36.0 g/dL   RDW 95.2 (H) 84.1 - 32.4 %   Platelets 815 (H) 150 - 400 K/uL   nRBC 0.3 (H) 0.0 - 0.2 %    Comment: Performed at [facility], [address], [place], [place] [address]  Comprehensive metabolic panel     Status: Abnormal   Collection Time: 06/04/21  3:52 AM  Result Value Ref Range   Sodium 133 (L)  135 - 145 mmol/L   Potassium 3.9 3.5 - 5.1 mmol/L   Chloride 98 98 - 111 mmol/L   CO2 29 22 - 32 mmol/L   Glucose, Bld 104 (H) 70 - 99 mg/dL    Comment: Glucose reference range applies only to samples taken after fasting for at least 8 hours.   BUN 16 8 - 23 mg/dL   Creatinine, Ser <7.25 (L) 0.61 - 1.24 mg/dL   Calcium 8.6 (L) 8.9 - 10.3 mg/dL   Total Protein 6.1 (L) 6.5 - 8.1 g/dL   Albumin 2.0 (L) 3.5 - 5.0 g/dL   AST 20 15 - 41 U/L   ALT 21 0 - 44 U/L   Alkaline Phosphatase 169 (H) 38 - 126 U/L   Total Bilirubin 0.4 0.3 - 1.2 mg/dL   GFR, Estimated NOT CALCULATED >60 mL/min    Comment: (NOTE) Calculated using the CKD-EPI Creatinine Equation (2021)    Anion gap 6 5 - 15    Comment: Performed at [facility], [address], [place], [place] [address]  Ammonia     Status: Abnormal   Collection Time: 06/04/21  3:52 AM  Result Value Ref Range   Ammonia 40 (H) 9 -  35 umol/L    Comment: Performed at [facility], [address] [place], [place] [address]    ECG   Sinus rhythm with sinus arrhythmia- Personally Reviewed  Telemetry   Sinus rhythm with paroxysmal atrial tachycardia, alternating complete heart block and Mobitz 2 AV block's- Personally Reviewed  Radiology   CT CHEST ABDOMEN PELVIS W CONTRAST  Result Date: 06/04/2021 CLINICAL DATA:  Sepsis.  Abdominal distension. EXAM: CT CHEST, ABDOMEN, AND PELVIS WITH CONTRAST TECHNIQUE: Multidetector CT imaging of the chest, abdomen and pelvis was performed following the standard protocol during bolus administration of intravenous contrast. CONTRAST:  80mL OMNIPAQUE IOHEXOL 350 MG/ML SOLN IV for abdominal exam. Due to technical error, the patient returned for chest CT after abdominal exam is performed, an additional 60 cc Omnipaque 350 IV was administered. COMPARISON:  Recent chest radiographs, chest CT 04/22/2021, abdominal CT 02/20/2021 FINDINGS: CT CHEST FINDINGS Cardiovascular: Normal heart  size with coronary artery calcifications. Tortuous aorta without acute findings. Prominent main pulmonary artery at 3 cm, unchanged. No pericardial effusion. Mediastinum/Nodes: Scattered small mediastinal lymph nodes are not enlarged by size criteria. Moderate-sized hiatal hernia. Wall thickening of the distal esophagus and the intrathoracic stomach. Lungs/Pleura: Moderate right and large left pleural effusion with associated compressive atelectasis. No significant change from prior exam. Breathing motion artifact limits assessment mild perifissural atelectasis. No convincing pulmonary edema. Musculoskeletal: Exaggerated thoracic kyphosis. Multilevel spondylosis. There are no acute or suspicious osseous abnormalities. Minor chest wall edema. CT ABDOMEN PELVIS FINDINGS Hepatobiliary: Motion artifact through the liver. 11 mm cyst in the hepatic dome. The gallbladder is distended. Layering stones or sludge in the gallbladder. Expansile filling defect within the main portal vein, new from prior abdominal CT. Multiple collaterals at the porta hepatis and in the upper abdomen. Common bile duct is difficult to define. No obvious common bile duct dilatation. Central periportal edema. Pancreas: Motion artifact through the pancreas significantly limits assessment. Parenchymal atrophy. No obvious peripancreatic inflammation. Spleen: Normal in size without focal abnormality. The portal vein is not well-defined and may be occluded, although there is motion artifact through this region limiting assessment. Adrenals/Urinary Tract: No adrenal nodule. No hydronephrosis. No renal stone or focal renal abnormality. Symmetric renal excretion on delayed phase imaging. Foley catheter decompresses the urinary bladder. There is diffuse bladder wall thickening, greater than typically seen with bladder nondistention. Stomach/Bowel: Moderate to large hiatal hernia. There are perigastric varices. No small bowel dilatation. No obvious small  bowel inflammation. Motion artifact and lack of enteric contrast limits bowel assessment. There perirectal varices. Colonic tortuosity with moderate volume of stool. Mild cecal distention. The appendix is not visualized. Vascular/Lymphatic: Expansile thrombus in the main portal vein and superior mesenteric vein. Suspect thrombus in the splenic vein, though not well-defined. There are multiple portosystemic collaterals, perirectal and perigastric varices. Normal caliber abdominal aorta. No bulky abdominopelvic adenopathy, although assessment for enlarged lymph nodes is limited due to motion. Reproductive: Prominent prostate gland. Other: Small to moderate volume abdominopelvic ascites. Generalized mesenteric edema. No free air. Mild generalized body wall edema. Musculoskeletal: Sacral decubitus ulcer. No obvious sacral destruction or periosteal reaction. Slight chronic deformity of the hips. No acute osseous abnormalities are seen. IMPRESSION: 1. 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 Suspect thrombus in the splenic vein, though not well-defined. Multiple portosystemic collaterals, perirectal and perigastric varices. 2. Moderate right and large left pleural effusion with associated compressive atelectasis. 3. Small to moderate volume abdominopelvic ascites. Generalized mesenteric edema. 4. Sacral  decubitus ulcer without obvious sacral destruction or periosteal reaction. 5. Distended gallbladder with layering stones or sludge in the gallbladder. 6. Diffuse bladder wall thickening, greater than typically seen with bladder nondistention. Recommend correlation with urinalysis to exclude urinary tract infection. 7. Moderate to large hiatal hernia. Wall thickening of the distal esophagus and intrathoracic stomach, can be seen with reflux or esophagitis. Aortic Atherosclerosis (ICD10-I70.0). These results will be called to the ordering clinician or representative by the Radiologist  Assistant, and communication documented in the PACS or [employer]. Electronically Signed   By: Narda Rutherford M.D.   On: 06/04/2021 01:09    Cardiac Studies   ECHOCARDIOGRAM REPORT         Patient Name:   ADRIANA STINEBAUGH Creamer Date of Exam: 02/21/2021  Medical Rec #:  [id number]      Height:       72.0 in  Accession #:    [id number]     Weight:       143.3 lb  Date of Birth:  23-Jul-1960     BSA:          1.849 m  Patient Age:    59 years       BP:           115/69 mmHg  Patient Gender: M              HR:           96 bpm.  Exam Location:  [facility]   Procedure: 2D Echo, Color Doppler and Cardiac Doppler   Indications:     Other abnormalities of the heart R00.8     History:         Patient has no prior history of Echocardiogram  examinations.                   Chronic bronchitis, pneumonia.     Sonographer:     Cristela Blue  Referring Phys:  [id number] Lorretta Harp  Diagnosing Phys: Debbe Odea MD      Sonographer Comments: Technically difficult study due to poor echo  windows. Pt was in fetal position on right side----could not be moved into  better position for echo.  IMPRESSIONS     1. Left ventricular ejection fraction, by estimation, is 55%. The left  ventricle has normal function. Left ventricular endocardial border not  optimally defined to evaluate regional wall motion. Left ventricular  diastolic parameters are consistent with  Grade I diastolic dysfunction (impaired relaxation).   2. Right ventricular systolic function is normal. The right ventricular  size is not well visualized.   3. The mitral valve is grossly normal. No evidence of mitral valve  regurgitation.   4. The aortic valve was not well visualized. Aortic valve regurgitation  is not visualized.   Impression   Principal Problem:   Acute dyspnea Active Problems:   Infantile cerebral palsy (HCC)   Pressure ulcer of foot   Functional quadriplegia (HCC)   Stage 3 skin ulcer of sacral region (HCC)    Microcytic anemia   Lactic acidosis   Iron deficiency anemia   Protein-calorie malnutrition, severe   Chronic diastolic CHF (congestive heart failure) (HCC)   Malnutrition of moderate degree   Recommendation   Mr. Denzel has alternating tachycardia which appears to be in atrial tachycardia as well as periods of high degree AV block and possible complete heart block.  This is suggestive of a tachybradycardia syndrome and suggests underlying sinus node dysfunction.  Unfortunately, given multiple comorbidities, functional quadriplegia, severe malnutrition, recent infection and recurrent issues with infection, he is not a candidate for pacemaker which would be definitive therapy for such a disorder.  Would avoid AV nodal blocking agents given high degree AV block, however if he continues to have paroxysmal tachyarrhythmias and is symptomatic with it (I am not sure that he is aware of his atrial tachycardia), then could consider something such as amiodarone however that could potentiate his AV block.  Ultimately, there are no good options for him regarding this.  He is currently DNR and followed by palliative.  I suspect his prognosis is poor in the short-term.  Thanks for the consultation.  Cardiology will be available as needed.  He can follow-up with Dr. Gwen Pounds at the [facility].  Time Spent Directly with Patient:  I have spent a total of 45 minutes with the patient reviewing hospital notes, telemetry, EKGs, labs and examining the patient as well as establishing an assessment and plan that was discussed personally with the patient.  > 50% of time was spent in direct patient care.  Length of Stay:  LOS: 12 days   Chrystie Nose, MD, [facility], FACP  [place]   [facility]  Medical Director of the [facility]  [facility] Clinic Diplomate of the American Board of Clinical Lipidology Attending Cardiologist  Direct Dial: [phone]   Fax: [phone]   Website:  [email or web address] Nicely Keena Heesch 06/04/2021, 11:17 AM

## 2021-06-04 NOTE — Progress Notes (Signed)
Pt was off the unit for CT scan 2 times. Pt had good night and did not complain of pain through night. Had 1 large type 3  BM after taking Miralax.

## 2021-06-04 NOTE — Progress Notes (Signed)
ANTICOAGULATION CONSULT NOTE - Initial Consult  Pharmacy Consult for Heparin Indication: VTE of portal & mesenteric vein  Allergies  Allergen Reactions   Adhesive [Tape] Rash    Patient Measurements: Height: 6' (182.9 cm) Weight: 99.8 kg (220 lb) IBW/kg (Calculated) : 77.6 Heparin Dosing Weight: actual body weight  Vital Signs: Temp: 98.5 F (36.9 C) (01/08 2220) Temp Source: Oral (01/08 2220) BP: 117/58 (01/08 2220) Pulse Rate: 57 (01/08 2220)  Labs: Recent Labs    06/02/21 0402 06/03/21 1435 06/04/21 0352  HGB 9.4* 10.0* 9.1*  HCT 29.9* 32.8* 29.1*  PLT 847* 903* 815*  CREATININE 0.30* 0.39* <0.30*    CrCl cannot be calculated (This lab value cannot be used to calculate CrCl because it is not a number: <0.30).   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   Anemia    microcytic anemia    Anxiety    takes Ativan daily as needed   Bipolar disorder ([place])    takes Depakote daily   CEREBRAL PALSY 07/24/2006   Chronic bronchitis ([place])    "gets it q yr"   Chronic bronchitis ([place])    Chronic diabetic ulcer of foot determined by examination [facility])    Constipation    takes Miralax daily as needed   Depression    takes Risperdal nightly   Environmental allergies    takes Zyrtec daily   GASTRIC ULCER ACUTE WITH HEMORRHAGE 07/24/2006   Gastric ulcer with hemorrhage    hx of    GERD (gastroesophageal reflux disease)    takes Pravacid daily   History of blood transfusion    no abnormal reaction noted   History of hiatal hernia    History of shingles    History of stomach ulcers ~ 1996   Hypercholesterolemia    Intellectual functioning disability    Intermittent explosive disorder    Internal hemorrhoids    MRSA infection 09/06/2019   Obsessive compulsive disorder    Osteomyelitis ([place])    right foot    Pneumonia "quite a few times"    in 2015 and in June 2016   Pressure ulcer of sacral region    hx of    Pseudomonas aeruginosa infection 09/06/2019   Psychotic disorder ([place])    Recurrent UTI (urinary tract infection)    Seborrheic dermatitis    Severe sepsis (HCC)    Sigmoid diverticulosis    Spastic tetraplegia ([place])    URINARY INCONTINENCE 02/09/2010   takes Detrol daily-occasionally incontinence   Vitamin D deficiency    Weakness    numbness in extremities    Medications:  No oral anticoagulation PTA Lovenox 40mg  sq q24h for VTE proph (05/22/21-06/03/21)  Assessment: 61 yr male admitted 12/27 with acute dyspnea with suspected aspiration pneumonia; underwent thoracentesis for pleural effusions CTAngio this AM = thrombosis of the main portal and mesenteric vein; suspect thrombus in the splenic vein Patient had been on Enoxaparin 40mg  sq q24h for VTE prophylaxis since admission on 05/22/21 - last administered dose was 06/03/21 @ 17:47  Goal of Therapy:  Heparin level 0.3-0.7 units/ml Monitor platelets by anticoagulation protocol: Yes   Plan:  Heparin dosing per [place] nomogram:  will give Heparin 2700 units IV bolus x 1 followed by heparin gtt @ 1600 units/hr Check heparin level 6 hr after heparin gtt started Daily heparin level and CBC Monitor for signs & symptoms  of bleeding  Everette Rank, PharmD 06/04/2021,6:28 AM

## 2021-06-04 NOTE — Care Management Important Message (Signed)
Important Message  Patient Details Palliative Care Name: [name] MRN: [id number] Date of Birth: 12/13/1960   Medicare Important Message Given:  No     Caren Macadam 06/04/2021, 2:38 PM

## 2021-06-05 DIAGNOSIS — K55069 Acute infarction of intestine, part and extent unspecified: Secondary | ICD-10-CM | POA: Diagnosis not present

## 2021-06-05 DIAGNOSIS — R109 Unspecified abdominal pain: Secondary | ICD-10-CM | POA: Diagnosis not present

## 2021-06-05 DIAGNOSIS — J90 Pleural effusion, not elsewhere classified: Secondary | ICD-10-CM

## 2021-06-05 DIAGNOSIS — Z7189 Other specified counseling: Secondary | ICD-10-CM | POA: Diagnosis not present

## 2021-06-05 DIAGNOSIS — E7220 Disorder of urea cycle metabolism, unspecified: Secondary | ICD-10-CM | POA: Diagnosis present

## 2021-06-05 DIAGNOSIS — I81 Portal vein thrombosis: Secondary | ICD-10-CM | POA: Diagnosis present

## 2021-06-05 DIAGNOSIS — L98429 Non-pressure chronic ulcer of back with unspecified severity: Secondary | ICD-10-CM

## 2021-06-05 DIAGNOSIS — R0600 Dyspnea, unspecified: Secondary | ICD-10-CM | POA: Diagnosis not present

## 2021-06-05 DIAGNOSIS — J189 Pneumonia, unspecified organism: Secondary | ICD-10-CM | POA: Diagnosis not present

## 2021-06-05 LAB — CBC
HCT: 32.1 % — ABNORMAL LOW (ref 39.0–52.0)
Hemoglobin: 9.6 g/dL — ABNORMAL LOW (ref 13.0–17.0)
MCH: 23.7 pg — ABNORMAL LOW (ref 26.0–34.0)
MCHC: 29.9 g/dL — ABNORMAL LOW (ref 30.0–36.0)
MCV: 79.3 fL — ABNORMAL LOW (ref 80.0–100.0)
Platelets: 413 10*3/uL — ABNORMAL HIGH (ref 150–400)
RBC: 4.05 MIL/uL — ABNORMAL LOW (ref 4.22–5.81)
RDW: 20.1 % — ABNORMAL HIGH (ref 11.5–15.5)
WBC: 10.9 10*3/uL — ABNORMAL HIGH (ref 4.0–10.5)
nRBC: 0.4 % — ABNORMAL HIGH (ref 0.0–0.2)

## 2021-06-05 LAB — HEPARIN LEVEL (UNFRACTIONATED): Heparin Unfractionated: 0.59 IU/mL (ref 0.30–0.70)

## 2021-06-05 MED ORDER — MORPHINE BOLUS VIA INFUSION
2.0000 mg | INTRAVENOUS | Status: DC | PRN
  Administered 2021-06-06 (×5): 2 mg via INTRAVENOUS
  Filled 2021-06-05: qty 2

## 2021-06-05 MED ORDER — LACTULOSE 10 GM/15ML PO SOLN
20.0000 g | Freq: Every day | ORAL | Status: DC | PRN
  Administered 2021-06-08 – 2021-06-09 (×2): 20 g via ORAL
  Filled 2021-06-05 (×2): qty 30

## 2021-06-05 MED ORDER — MORPHINE 100MG IN NS 100ML (1MG/ML) PREMIX INFUSION
5.0000 mg/h | INTRAVENOUS | Status: DC
  Administered 2021-06-05: 2 mg/h via INTRAVENOUS
  Administered 2021-06-06 – 2021-06-08 (×2): 3 mg/h via INTRAVENOUS
  Administered 2021-06-09 – 2021-06-10 (×3): 4 mg/h via INTRAVENOUS
  Administered 2021-06-11 – 2021-06-12 (×2): 7 mg/h via INTRAVENOUS
  Administered 2021-06-13: 5 mg/h via INTRAVENOUS
  Administered 2021-06-14 – 2021-06-16 (×6): 7 mg/h via INTRAVENOUS
  Administered 2021-06-17: 8 mg/h via INTRAVENOUS
  Administered 2021-06-17: 7 mg/h via INTRAVENOUS
  Administered 2021-06-18 – 2021-06-21 (×8): 8 mg/h via INTRAVENOUS
  Filled 2021-06-05 (×25): qty 100

## 2021-06-05 NOTE — Progress Notes (Signed)
PROGRESS NOTE  [name] WIO:[id number] DOB: 1960/09/14 DOA: 05/22/2021 PCP: Audley Hose, MD  HPI/Recap of past 24 hours:  [name] is a 61 y.o. male with medical history significant of chronic diastolic CHF, right foot amputee, iron deficiency anemia, anxiety, bipolar disorder, intellectual functional disability, obsessive-compulsive disorder, cerebral palsy, chronic bronchitis, constipation, environmental allergies, gastric ulcer with history of UGI bleed necessitating blood transfusion, GERD, hiatal hernia, history of varicella-zoster, hyperlipidemia, osteomyelitis of right foot, history of pneumonia, pressure ulcer of sacral area, history of Pseudomonas infection, recurrent UTIs, seborrheic dermatitis, vitamin D deficiency, functional quadriplegia who was brought from his group home due to productive cough for about a week.  Work up revealed community-acquired pneumonia with concern for possible aspiration, also revealed large B/L pleural effusions.  He was started on Rocephin and azithromycin empirically.  Post R thoracentesis by IR on 12/29 and post L thoracentesis by IR on 12/30.  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  He has had difficult to control pain.  After discussing with palliative and patient's sister, he has been transitioned to comfort measures.  06/05/2021: Patient seen in his room.  His sister is at the bedside.  He has been started on morphine infusion and is somnolent, but appears comfortable.    Assessment/Plan: Principal Problem:   Acute dyspnea Active Problems:   Infantile cerebral palsy (HCC)   Pressure ulcer of foot   Constipation   Functional quadriplegia (HCC)   Stage 3 skin ulcer of sacral region (HCC)   Microcytic anemia   Lactic acidosis   Hx of right BKA (HCC)   Dysphagia   Aspiration pneumonia (HCC)   Iron deficiency anemia    Protein-calorie malnutrition, severe   Chronic diastolic CHF (congestive heart failure) (HCC)   Malnutrition of moderate degree   Portal vein thrombosis   Bilateral pleural effusion   Hyperammonemia (HCC)   Superior mesenteric vein thrombosis (HCC)  Resolved acute dyspnea secondary to community-acquired pneumonia with concern for recurrent aspiration and large B/L pleural effusions, s/p bilateral thoracentesis by IR. Speech therapist evaluated, recs reg consistency and thin fluids with aspiration precautions. Maintain oxygen saturation greater 92% No longer requiring oxygen supplementation post bilateral thoracentesis. Treat underlying conditions Completed 5 days of azithromycin Completed 5 days of Rocephin MRSA screening test positive Repeat imaging on 1/8 showed persistent pleural effusions Underwent repeat left thoracentesis 1/9  Sacral Pressure wounds, present on admission Continue local wound care with wound care specialist guidance. Patient does have some erythema around his wound, no drainage appreciated.  Started on Ancef for possible infection, but not discontinued based on goals of care.  See below. Continue pain control   Acute portal vein, superior mesenteric vein, splenic vein thrombosis -Patient was complaining of persistent abdominal pain -Found to have thrombosis in abdominal vessels -Started on IV heparin, now discontinued based on goals of care.  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 -Seen by cardiology who did not recommend pacemaker.  Avoiding AV nodal agents was  recommended  Hyperammonemia -Ammonia level 90 -He was started on lactulose -Depakote dose was held, level  was checked and noted to be subtherapeutic -He received a dose of L-carnitine -Follow-up level noted to be 40   Chronic diastolic CHF Continue strict I's and O's and daily weight  Infantile cerebral palsy Functional quadriplegia Muscle relaxers as needed Analgesics as needed  Iron deficiency anemia Hemoglobin has been stable Iron 19, ferritin 102  Thrombocytosis -Likely reactive to underlying abdominal vein thrombosis  Severe protein calorie malnutrition Continue protein supplementation  Chronic constipation He has been getting lactulose, Senokot, smog enemas. Despite having multiple bowel movements, CT imaging from 1/9 shows moderate volume of stool in colon  Chronic indwelling foley catheter Facility confirms that patient has a chronic foley catheter Continue Flomax   Goals of care DNR Palliative care team consulted to assist with establishing goals of care Patient has had difficulty to manage pain -He is routinely complaining of significant abdominal pain as well as pain in his sacral wound and visibly appears uncomfortable -When he is given pain medication, he frequently becomes lethargic -With underlying cardiac issues including sinus node dysfunction, thrombosis of abdominal veins requiring anticoagulation, persistent sacral wound which appears to be nonhealing, hypoalbuminemia and poor nutritional status functional status, his long-term prognosis appears to be very poor -I had an honest discussion with his legal guardian/sister Ms. [name] Fulcher and expressed my concerns regarding his overall quality of life/pain management and expected prognosis -I have recommended that she consider hospice for better Pain control and focus on his overall quality of life -[name] appears to be in agreement with this and would be interested in moving [place] to a residential  hospice facility for end-of-life care -Discussed with Dr. Domingo Cocking who also met with [name] -Patient is started on morphine infusion for pain management   Code Status: DNR, comfort measures  Family Communication: Updated patient's guardian/sister, Ms. Cinda Quest  Disposition Plan: Possibly residential hospice   Consultants: Palliative care team  Procedures: None  Antimicrobials: Rocephin Azithromycin Ancef 1/9 >  DVT prophylaxis: Heparin infusion  Status is: Inpatient  Patient requires at least 2 midnights for further evaluation and treatment.      Objective: Vitals:   06/04/21 2053 06/05/21 0400 06/05/21 0500 06/05/21 1308  BP: (!) 143/67 (!) 110/91  100/68  Pulse: 78 (!) 104  (!) 58  Resp: 12 18  18   Temp: 97.8 F (36.6 C) 97.6 F (36.4 C)  98 F (36.7 C)  TempSrc: Oral Oral  Oral  SpO2: 96% 93%  96%  Weight:   103.4 kg   Height:        Intake/Output Summary (Last 24 hours) at 06/05/2021 2245 Last data filed at 06/05/2021 1750 Gross per 24 hour  Intake 1014.18 ml  Output 425 ml  Net 589.18 ml   Filed Weights   06/03/21 0500 06/03/21 0628 06/05/21 0500  Weight: 99.8 kg 99.8 kg 103.4 kg    Exam:  General exam: Somnolent, resting, appears comfortable Respiratory system: Diminished breath sounds bilaterally short shallow breaths.. Respiratory effort normal. Cardiovascular system:RRR. No murmurs, rubs, gallops. Gastrointestinal system: Abdomen is distended, firm. No organomegaly or masses felt. Normal bowel sounds heard.   Data Reviewed: CBC: Recent Labs  Lab 06/01/21 0404 06/02/21 0402 06/03/21 1435 06/04/21 0352 06/05/21 0335  WBC 10.6* 9.4 11.5* 10.3 10.9*  HGB 9.1* 9.4* 10.0* 9.1* 9.6*  HCT 29.6* 29.9* 32.8* 29.1* 32.1*  MCV 75.7* 76.1* 76.8* 76.4* 79.3*  PLT 782* 847* 903* 815* 474*   Basic Metabolic Panel: Recent Labs  Lab 05/31/21 0400 06/01/21 0404 06/02/21 0402 06/03/21  1433 06/03/21 1435 06/04/21 0352  NA 133* 136 132*   --  132* 133*  K 4.1 4.4 4.6  --  4.5 3.9  CL 103 100 99  --  99 98  CO2 25 26 26   --  25 29  GLUCOSE 87 94 86  --  233* 104*  BUN 19 20 14   --  17 16  CREATININE 0.33* 0.42* 0.30*  --  0.39* <0.30*  CALCIUM 8.3* 8.8* 8.5*  --  9.1 8.6*  MG  --   --   --  2.0  --   --    GFR: CrCl cannot be calculated (This lab value cannot be used to calculate CrCl because it is not a number: <0.30). Liver Function Tests: Recent Labs  Lab 06/01/21 0404 06/03/21 1435 06/04/21 0352  AST 22 26 20   ALT 22 26 21   ALKPHOS 159* 206* 169*  BILITOT 0.3 0.4 0.4  PROT 6.2* 7.1 6.1*  ALBUMIN 2.0* 2.2* 2.0*    No results for input(s): LIPASE, AMYLASE in the last 168 hours. Recent Labs  Lab 06/03/21 1435 06/04/21 0352  AMMONIA 94* 40*   Coagulation Profile: No results for input(s): INR, PROTIME in the last 168 hours. Cardiac Enzymes: No results for input(s): CKTOTAL, CKMB, CKMBINDEX, TROPONINI in the last 168 hours. BNP (last 3 results) No results for input(s): PROBNP in the last 8760 hours. HbA1C: No results for input(s): HGBA1C in the last 72 hours. CBG: No results for input(s): GLUCAP in the last 168 hours.  Lipid Profile: No results for input(s): CHOL, HDL, LDLCALC, TRIG, CHOLHDL, LDLDIRECT in the last 72 hours. Thyroid Function Tests: Recent Labs    06/03/21 1613  TSH 1.311   Anemia Panel: Recent Labs    06/03/21 1613  FERRITIN 102  TIBC 322  IRON 19*   Urine analysis:    Component Value Date/Time   COLORURINE YELLOW (A) 02/20/2021 1432   APPEARANCEUR CLEAR (A) 02/20/2021 1432   LABSPEC >1.046 (H) 02/20/2021 1432   PHURINE 6.0 02/20/2021 1432   GLUCOSEU NEGATIVE 02/20/2021 1432   HGBUR NEGATIVE 02/20/2021 1432   HGBUR negative 06/14/2010 1119   BILIRUBINUR NEGATIVE 02/20/2021 1432   BILIRUBINUR SMALL 01/04/2016 1634   KETONESUR NEGATIVE 02/20/2021 1432   PROTEINUR 100 (A) 02/20/2021 1432   UROBILINOGEN 2.0 (H) 02/05/2018 1825   NITRITE NEGATIVE 02/20/2021 1432    LEUKOCYTESUR NEGATIVE 02/20/2021 1432   Sepsis Labs: @LABRCNTIP (procalcitonin:4,lacticidven:4)  ) No results found for this or any previous visit (from the past 240 hour(s)).     Studies: No results found.  Scheduled Meds:  (feeding supplement) PROSource Plus  30 mL Oral Daily   baclofen  20 mg Oral TID   bisacodyl  10 mg Rectal QODAY   chlorhexidine  15 mL Mouth Rinse BID   Chlorhexidine Gluconate Cloth  6 each Topical Daily   clotrimazole  1 application Topical BID   diclofenac Sodium  4 g Topical QID   divalproex  500 mg Oral BID   feeding supplement  237 mL Oral BID BM   fluticasone  1 spray Each Nare Daily   hydrocortisone cream  1 application Topical BID   ketoconazole  1 application Topical Once per day on Mon Thu   mouth rinse  15 mL Mouth Rinse BID   risperiDONE  2 mg Oral QHS    Continuous Infusions:  morphine 2 mg/hr (06/05/21 1358)       LOS: 13 days     [employer],  MD [facility]   If 7PM-7AM, please contact night-coverage [email or web address]  06/05/2021, 10:45 PM

## 2021-06-05 NOTE — Plan of Care (Signed)
  Problem: Activity: Goal: Risk for activity intolerance will decrease Outcome: Progressing   Problem: Safety: Goal: Ability to remain free from injury will improve Outcome: Progressing   

## 2021-06-05 NOTE — Progress Notes (Signed)
Daily Progress Note   Patient Name: [name]       Date: 06/05/2021 DOB: 18-Jul-1960  Age: 61 y.o. MRN#: [id number] Attending Physician: Kathie Dike, MD Primary Care Physician: Audley Hose, MD Admit Date: 05/22/2021  Reason for Consultation/Follow-up: Establishing goals of care  Subjective: Discussed case with Dr. Roderic Palau as well as bedside RN.  I saw and examined [name] earlier this morning.  He was awake and crying in pain at that time.  I reexamined [name] and met with his sister and the director from the group home where he has lived for the last 30 years.  We discussed clinical course as well as wishes moving forward in regard to his care plan moving forward.    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  They report that [name] has been having increased frequency of admissions related to recurrent aspiration.  Both his sister and the director of his group home report understanding that we are likely approaching end-of-life.  They said that [name] told him today that he is dying.  We discussed #1 goal being that he is not hurting as they report his pain has not been controlled for a long period of time.  He is not going to be able to go back to group home, and other concern is that he will end up in a facility that does not know him and he will not be comfortable unknown environment.  We discussed difference between a aggressive medical intervention path and a palliative, comfort focused care path.  Discussed that he has many issues that could lead to acute decompensation, but it is also possible that he does  not decline quickly.  We discussed recommendation for hospice services at time of discharge.  This is not something that his group home will be able to accommodate.  We discussed plan to transition to more of a focus on comfort including low-dose continuous infusion of pain medication to help relieve his suffering.  If he decompensates quickly and is not able to maintain himself in regard to nutrition and hydration, recommendation will be for residential hospice.  His sister lives in [place] area and would be interested in [place] if possible.  If he does not decline quickly, will likely be looking at long-term care placement need with hospice support.  I doubt he is going to be able to return to his group home in either circumstance, but we are planning to address this further based upon his clinical course over the next couple of days.  Questions and concerns addressed.  We have a follow-up meeting planned for Thursday at 11 AM.  Length of Stay: 13  Current Medications: Scheduled Meds:   (feeding supplement) PROSource Plus  30 mL Oral Daily   baclofen  20 mg Oral TID   bisacodyl  10 mg Rectal QODAY   chlorhexidine  15 mL Mouth Rinse BID   Chlorhexidine Gluconate Cloth  6 each Topical Daily   clotrimazole  1 application Topical BID   diclofenac Sodium  4 g Topical QID   divalproex  500 mg Oral BID   feeding supplement  237 mL Oral BID BM   fluticasone  1 spray Each Nare Daily   hydrocortisone cream  1 application Topical BID   ketoconazole  1 application Topical Once per day on Mon Thu   mouth rinse  15 mL Mouth Rinse BID   risperiDONE  2 mg Oral QHS    Continuous Infusions:  morphine 2 mg/hr (06/05/21 1358)    PRN Meds: acetaminophen **OR** acetaminophen, albuterol, alum & mag hydroxide-simeth, bisacodyl, guaiFENesin-dextromethorphan, lactulose, liver oil-zinc oxide, LORazepam, morphine, ondansetron **OR** ondansetron (ZOFRAN) IV  Physical Exam         Resting in  bed moaning in distress at time of initial encounter.  Improved at second encounter S 1 S 2  Regular work of breathing wound care notes reviewed.   Vital Signs: BP 100/68 (BP Location: Right Arm)    Pulse (!) 58    Temp 98 F (36.7 C) (Oral)    Resp 18    Ht 6' (1.829 m)    Wt 103.4 kg    SpO2 96%    BMI 30.92 kg/m  SpO2: SpO2: 96 % O2 Device: O2 Device: Room Air O2 Flow Rate: O2 Flow Rate (L/min): 3 L/min  Intake/output summary:  Intake/Output Summary (Last 24 hours) at 06/05/2021 1652 Last data filed at 06/05/2021 1348 Gross per 24 hour  Intake 1606.46 ml  Output 275 ml  Net 1331.46 ml    LBM: Last BM Date: 06/05/21 Baseline Weight: Weight: 59.8 kg Most recent weight: Weight: 103.4 kg       Palliative Assessment/Data:      Patient Active Problem List   Diagnosis Date Noted   Malnutrition of moderate degree 05/31/2021   Acute dyspnea 05/22/2021   Acute on chronic diastolic CHF (congestive heart failure) ([place]) 05/22/2021   Chronic diastolic CHF (congestive heart failure) ([place]) 05/22/2021   Protein-calorie malnutrition, severe 02/28/2021   Iron deficiency anemia 02/20/2021   Obsessive compulsive disorder    Cellulitis of sacral region 12/06/2020   Right pulmonary infiltrate on CXR 10/03/2020   Dysphagia 10/03/2020   GERD without esophagitis 10/03/2020   Aspiration pneumonia ([place]) 10/03/2020   Recurrent right pleural effusion 10/02/2020   Abnormal CT scan, colon    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   Nausea and vomiting 07/31/2020   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 ([place]) 10/08/2019   Osteomyelitis ([place]) 10/08/2019   Acute osteomyelitis of right ankle or foot ([place])    Ulcerated, foot, right, with necrosis of bone ([place])    Pseudomonas aeruginosa infection 09/06/2019   MRSA infection 09/06/2019  Lactic acidosis    Stage 3 skin ulcer of sacral region ([place]) 08/10/2018   Gastric wall thickening 08/10/2018   Alkaline phosphatase elevation 08/10/2018    Cholelithiasis 08/10/2018   Microcytic anemia    Hiatal hernia 04/07/2018   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) 01/20/2016   Functional quadriplegia ([place]) 12/15/2015   Cerebral palsy, quadriplegic ([place])    Pre-diabetes 08/31/2015   Foot ulcer, limited to breakdown of skin ([place]) 06/08/2015   Seasonal allergies 11/01/2014   Pressure ulcer of foot 03/14/2014   CAP (community acquired pneumonia) 03/13/2014   Neuropathy 01/13/2013   Decubitus ulcer of left hip, stage 3 ([place]) 01/08/2013   URINARY INCONTINENCE 02/09/2010   Intellectual disability 07/24/2006   Infantile cerebral palsy ([place]) 07/24/2006    Palliative Care Assessment & Plan   Patient Profile:    Assessment:  61 year old gentleman from a group home, history of chronic diastolic congestive heart failure, history of right foot amputation iron deficiency anemia anxiety bipolar disorder, OCD, cerebral palsy, chronic bronchitis constipation allergies, history of gastric ulcer history of GI bleed, GERD hiatal hernia dyslipidemia history of pressure ulcer of sacral area, history of Pseudomonas infection and recurrent urinary tract infections.  Patient has functional quadriplegia.  Brought in from his group home due to productive cough, work-up revealed community-acquired pneumonia with concern for possible aspiration as well as large bilateral pleural effusions.  Patient admitted to hospital medicine service, started on antibiotics, also underwent a right thoracenteses and a left thoracenteses. Ongoing issues this hospitalization include concern for recurrent aspiration, speech therapist have been following and they have recommended for only comfort feeds.   Recommendations/Plan: Plan to transition to focus on comfort and follow his clinical course closely over the next couple of days to determine best option for discharge as it is not an option to return to group home with hospice support. Pain: MAR reviewed and based  on his usage over the last 24 hours we will plan for initiation of morphine infusion at 2 mg/h.  Additional 2 mg every 30 minutes as needed for breakthrough pain. He is at high risk for acute decompensation.  If he decompensates, plan will be for residential hospice.  Otherwise, we will likely be looking at a situation of needing long-term care with hospice support. Plan for follow-up meeting on Thursday at 79 AM with family.   Code Status:    Code Status Orders  (From admission, onward)           Start     Ordered   05/22/21 1709  Do not attempt resuscitation (DNR)  Continuous       Question Answer Comment  In the event of cardiac or respiratory ARREST Do not call a code blue   In the event of cardiac or respiratory ARREST Do not perform Intubation, CPR, defibrillation or ACLS   In the event of cardiac or respiratory ARREST Use medication by any route, position, wound care, and other measures to relive pain and suffering. May use oxygen, suction and manual treatment of airway obstruction as needed for comfort.      05/22/21 1711           Code Status History     Date Active Date Inactive Code Status Order ID Comments User Context   05/22/2021 1604 05/22/2021 1711 Full Code [id number]  [name], MD ED   02/21/2021 0648 03/03/2021 2347 Full Code [id number]  Ivor Costa, MD ED   02/20/2021 [phone] 02/21/2021 [phone]  DNR [id number]  Ivor Costa, MD ED   12/06/2020 2258 12/16/2020 0713 Full Code [id number]  Tacey Ruiz, MD Inpatient   10/03/2020 0055 10/06/2020 2346 DNR [id number]  Vernelle Emerald, MD ED   10/03/2020 0055 10/03/2020 0055 DNR [id number]  Vernelle Emerald, MD ED   09/05/2020 1039 09/07/2020 2311 DNR [id number]  Terrilee Croak, MD Inpatient   09/05/2020 0258 09/05/2020 1038 Full Code [id number]  Dwyane Dee, MD ED   07/31/2020 1758 08/04/2020 0323 Full Code [id number]  Bonnielee Haff, MD ED   10/08/2019 1815 10/09/2019 2042 Full Code [id number]  Cherylann Ratel A, DO Inpatient    04/08/2019 1316 04/12/2019 2132 Full Code [id number]  [name], DO ED   08/10/2018 1642 08/15/2018 1737 DNR [id number]  Annita Brod, MD Inpatient   01/14/2016 0946 01/18/2016 2226 DNR [id number]  Dory Horn, NP Inpatient   01/08/2016 1522 01/14/2016 0946 Full Code [id number]  Smiley Houseman, MD Inpatient   10/19/2015 2054 10/22/2015 1646 DNR [id number]  Aquilla Hacker, MD Inpatient   12/14/2014 1236 12/15/2014 1710 Full Code [id number]  Newt Minion, MD Inpatient   10/16/2014 1036 10/20/2014 1813 Full Code [id number]  Leone Brand, MD Inpatient   03/13/2014 0323 03/14/2014 2132 Full Code [id number]  Hilton Sinclair, MD Inpatient       Prognosis:  Unable to determine  Discharge Planning: Group home with palliative.   Care plan was discussed with  IDT  Thank you for allowing the Palliative Medicine Team to assist in the care of this patient.  Micheline Rough, MD  Please contact Palliative Medicine Team phone at [phone] for questions and concerns.

## 2021-06-05 NOTE — Progress Notes (Signed)
ANTICOAGULATION CONSULT NOTE - Follow Up Consult  Pharmacy Consult for Heparin Indication: VTE treatment  Allergies  Allergen Reactions   Adhesive [Tape] Rash    Patient Measurements: Height: 6' (182.9 cm) Weight:  (can't measure wt because bed doesn't work for [employer] wt) IBW/kg (Calculated) : 77.6 Heparin Dosing Weight: TBW  Vital Signs: Temp: 97.6 F (36.4 C) (01/10 0400) Temp Source: Oral (01/10 0400) BP: 110/91 (01/10 0400) Pulse Rate: 104 (01/10 0400)  Labs: Recent Labs    06/03/21 1435 06/04/21 0352 06/04/21 1350 06/04/21 1954 06/05/21 0335  HGB 10.0* 9.1*  --   --  9.6*  HCT 32.8* 29.1*  --   --  32.1*  PLT 903* 815*  --   --  413*  HEPARINUNFRC  --   --  0.54 0.37 0.59  CREATININE 0.39* <0.30*  --   --   --      CrCl cannot be calculated (This lab value cannot be used to calculate CrCl because it is not a number: <0.30).   Assessment: 56 yoM admitted 12/27 with acute dyspnea with suspected aspiration pneumonia; underwent thoracentesis for pleural effusions.  CTa showed thrombosis of the main portal and mesenteric vein; suspect thrombus in the splenic vein  Patient had been on Enoxaparin 40mg  sq q24h for VTE prophylaxis since admission on 05/22/21 - last administered dose was 06/03/21 @ 17:47  Today, 06/05/2021 Heparin level 0.59, remains therapeutic on heparin 1600 units/hr. CBC:  Hgb 9.6, Plt remains elevated No bleeding or complications per RN   Goal of Therapy:  Heparin level 0.3-0.7 units/ml Monitor platelets by anticoagulation protocol: Yes   Plan:  Continue heparin IV infusion at 1600 units/hr Daily heparin level and CBC  Dolly Rias RPh 06/05/2021, 4:25 AM

## 2021-06-06 DIAGNOSIS — Z7189 Other specified counseling: Secondary | ICD-10-CM | POA: Diagnosis not present

## 2021-06-06 DIAGNOSIS — R109 Unspecified abdominal pain: Secondary | ICD-10-CM | POA: Diagnosis not present

## 2021-06-06 DIAGNOSIS — R0600 Dyspnea, unspecified: Secondary | ICD-10-CM | POA: Diagnosis not present

## 2021-06-06 DIAGNOSIS — J189 Pneumonia, unspecified organism: Secondary | ICD-10-CM | POA: Diagnosis not present

## 2021-06-06 MED ORDER — MORPHINE BOLUS VIA INFUSION
3.0000 mg | INTRAVENOUS | Status: DC | PRN
  Administered 2021-06-06 – 2021-06-11 (×18): 3 mg via INTRAVENOUS
  Filled 2021-06-06: qty 3

## 2021-06-06 MED ORDER — SCOPOLAMINE 1 MG/3DAYS TD PT72
3.0000 | MEDICATED_PATCH | TRANSDERMAL | Status: DC
  Administered 2021-06-06 – 2021-06-21 (×6): 4.5 mg via TRANSDERMAL
  Filled 2021-06-06 (×6): qty 3

## 2021-06-06 MED ORDER — HALOPERIDOL LACTATE 5 MG/ML IJ SOLN: 1.0000 mg | INTRAMUSCULAR | Status: DC | PRN

## 2021-06-06 MED ORDER — LORAZEPAM 2 MG/ML IJ SOLN
1.0000 mg | Freq: Four times a day (QID) | INTRAMUSCULAR | Status: DC | PRN
  Administered 2021-06-06: 1 mg via INTRAVENOUS
  Filled 2021-06-06: qty 1

## 2021-06-06 MED ORDER — GLYCOPYRROLATE 0.2 MG/ML IJ SOLN
0.2000 mg | INTRAMUSCULAR | Status: DC | PRN
  Administered 2021-06-06 – 2021-06-08 (×2): 0.2 mg via INTRAVENOUS
  Filled 2021-06-06 (×2): qty 1

## 2021-06-06 NOTE — TOC Progression Note (Signed)
Transition of Care [facility]) - Progression Note    Patient Details  Name: [name] MRN: [id number] Date of Birth: 09/02/1960  Transition [facility] CM/SW Contact  Geni Bers, RN Phone Number: 06/06/2021, 12:33 PM  Clinical Narrative:    TOC will continue to follow for disposition.    Expected Discharge Plan: Group Home Barriers to Discharge: No Barriers Identified  Expected Discharge Plan and Services Expected Discharge Plan: Group Home       Living arrangements for the past 2 months:  (Group home)                                       Social Determinants of Health (SDOH) Interventions    Readmission Risk Interventions Readmission Risk Prevention Plan 08/03/2020  Post Dischage Appt Complete  Medication Screening Complete  Some recent data might be hidden

## 2021-06-06 NOTE — Progress Notes (Signed)
Pt is not alert enough to administer PO meds. Pt did not receive baclofen, depakote, and risperdal. Daniels,NP made aware.

## 2021-06-06 NOTE — Plan of Care (Signed)
  Problem: Safety: Goal: Ability to remain free from injury will improve Outcome: Progressing   

## 2021-06-06 NOTE — Progress Notes (Signed)
Daily Progress Note   Patient Name: [name]       Date: 06/06/2021 DOB: 12/07/1960  Age: 61 y.o. MRN#: [id number] Attending Physician: Karie Kirks, DO Primary Care Physician: Audley Hose, MD Admit Date: 05/22/2021  Reason for Consultation/Follow-up: Establishing goals of care  Subjective: I saw and examined [name] today and discussed with [name] (who is the director of the group home where he has lived for 30 years), who was at the bedside.  Reviewed conversation from yesterday with [name] and patient's sister, [name], regarding plan for comfort care and seeing how he does with likely recommendation for transition to residential hospice if he continues to decline.    Discussed with RN who reports he was having increased symptom burden that responded well to additional bolus doses.  Reviewed plan for me to review MAR later this afternoon and make appropriate adjustments to continuous infusion if needed.  Plan remains for follow-up family meeting tomorrow at 39 AM.  Length of Stay: 14  Current Medications: Scheduled Meds:   (feeding supplement) PROSource Plus  30 mL Oral Daily   chlorhexidine  15 mL Mouth Rinse BID   Chlorhexidine Gluconate Cloth  6 each Topical Daily   clotrimazole  1 application Topical BID   diclofenac Sodium  4 g Topical QID   hydrocortisone cream  1 application Topical BID   ketoconazole  1 application Topical Once per day on Mon Thu   mouth rinse  15 mL Mouth Rinse BID   risperiDONE  2 mg Oral QHS    Continuous Infusions:  morphine 3 mg/hr (06/06/21 1646)    PRN Meds: acetaminophen **OR** acetaminophen, albuterol, alum & mag hydroxide-simeth, bisacodyl, glycopyrrolate, guaiFENesin-dextromethorphan, haloperidol lactate, lactulose, liver  oil-zinc oxide, LORazepam, morphine, ondansetron **OR** ondansetron (ZOFRAN) IV  Physical Exam         Resting in bed No distress.  Does not respond to verbal or tactile stimulation. S 1 S 2  Regular work of breathing wound care notes reviewed.   Vital Signs: BP 110/88 (BP Location: Right Arm)    Pulse (!) 51    Temp 97.9 F (36.6 C) (Oral)    Resp 18    Ht 6' (1.829 m)    Wt 103.4 kg    SpO2 95%    BMI 30.92 kg/m  SpO2: SpO2: 95 % O2 Device: O2 Device: Room Air O2 Flow Rate: O2 Flow Rate (L/min): 3 L/min  Intake/output summary:  Intake/Output Summary (Last 24 hours) at 06/06/2021 1707 Last data filed at 06/06/2021 0700 Gross per 24 hour  Intake 262.06 ml  Output 350 ml  Net -87.94 ml    LBM: Last BM Date: 06/05/21 Baseline Weight: Weight: 59.8 kg Most recent weight: Weight: 103.4 kg       Palliative Assessment/Data:      Patient Active Problem List   Diagnosis Date Noted   Portal vein thrombosis 06/05/2021   Bilateral pleural effusion 06/05/2021   Hyperammonemia ([place]) 06/05/2021   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   Malnutrition of moderate degree 05/31/2021   Acute dyspnea 05/22/2021   Acute on chronic diastolic CHF (congestive heart failure) ([place]) 05/22/2021   Chronic diastolic CHF (congestive heart failure) ([place]) 05/22/2021   Protein-calorie malnutrition, severe 02/28/2021   Iron deficiency anemia 02/20/2021   Obsessive compulsive disorder    Cellulitis of sacral region 12/06/2020   Right pulmonary infiltrate on CXR 10/03/2020   Dysphagia 10/03/2020   GERD without esophagitis 10/03/2020   Aspiration pneumonia ([place]) 10/03/2020   Recurrent right pleural effusion 10/02/2020   Abnormal CT scan, colon    Hx of right BKA ([place]) 09/05/2020   Nausea and vomiting 07/31/2020   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 ([place]) 10/08/2019   Osteomyelitis ([place]) 10/08/2019   Acute osteomyelitis of right ankle or foot (HCC)    Ulcerated, foot, right, with  necrosis of bone (HCC)    Pseudomonas aeruginosa infection 09/06/2019   MRSA infection 09/06/2019   Lactic acidosis    Stage 3 skin ulcer of sacral region ([place]) 08/10/2018   Gastric wall thickening 08/10/2018   Alkaline phosphatase elevation 08/10/2018   Cholelithiasis 08/10/2018   Microcytic anemia    Hiatal hernia 04/07/2018   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 ([place]) 01/20/2016   Functional quadriplegia ([place]) 12/15/2015   Cerebral palsy, quadriplegic ([place])    Pre-diabetes 08/31/2015   Foot ulcer, limited to breakdown of skin ([place]) 06/08/2015   Constipation 11/01/2014   Seasonal allergies 11/01/2014   Pressure ulcer of foot 03/14/2014   CAP (community acquired pneumonia) 03/13/2014   Neuropathy 01/13/2013   Decubitus ulcer of left hip, stage 3 ([place]) 01/08/2013   URINARY INCONTINENCE 02/09/2010   Intellectual disability 07/24/2006   Infantile cerebral palsy ([place]) 07/24/2006    Palliative Care Assessment & Plan   Patient Profile:    Assessment:  61 year old gentleman from a group home, history of chronic diastolic congestive heart failure, history of right foot amputation iron deficiency anemia anxiety bipolar disorder, OCD, cerebral palsy, chronic bronchitis constipation allergies, history of gastric ulcer history of GI bleed, GERD hiatal hernia dyslipidemia history of pressure ulcer of sacral area, history of Pseudomonas infection and recurrent urinary tract infections.  Patient has functional quadriplegia.  Brought in from his group home due to productive cough, work-up revealed community-acquired pneumonia with concern for possible aspiration as well as large bilateral pleural effusions.  Patient admitted to hospital medicine service, started on antibiotics, also underwent a right thoracenteses and a left thoracenteses. Ongoing issues this hospitalization include concern for recurrent aspiration, speech therapist have been following and they have recommended for  only comfort feeds.   Recommendations/Plan: Plan to transition to focus on comfort and follow his clinical course closely over the next couple of days to determine best option for discharge as it is not an  option to return to group home with hospice support. Pain: Continue morphine infusion at 2 mg/h.  Continue breakthrough dosing of 2 mg every 30 minutes as needed.  He has needed some breakthrough dosing.  There is a good chance we will need to uptitrate his infusion based upon usage throughout the day today. He is at high risk for acute decompensation.  If he decompensates, plan will be for residential hospice.  Otherwise, we will likely be looking at a situation of needing long-term care with hospice support. Plan for follow-up meeting on Thursday at 90 AM with family.   Code Status:    Code Status Orders  (From admission, onward)           Start     Ordered   05/22/21 1709  Do not attempt resuscitation (DNR)  Continuous       Question Answer Comment  In the event of cardiac or respiratory ARREST Do not call a code blue   In the event of cardiac or respiratory ARREST Do not perform Intubation, CPR, defibrillation or ACLS   In the event of cardiac or respiratory ARREST Use medication by any route, position, wound care, and other measures to relive pain and suffering. May use oxygen, suction and manual treatment of airway obstruction as needed for comfort.      05/22/21 1711           Code Status History     Date Active Date Inactive Code Status Order ID Comments User Context   05/22/2021 1604 05/22/2021 1711 Full Code [id number]  [name], MD ED   02/21/2021 0648 03/03/2021 2347 Full Code [id number]  Ivor Costa, MD ED   02/20/2021 1801 02/21/2021 0648 DNR [id number]  Ivor Costa, MD ED   12/06/2020 2258 12/16/2020 0713 Full Code [id number]  Tacey Ruiz, MD Inpatient   10/03/2020 0055 10/06/2020 2346 DNR [id number]  Vernelle Emerald, MD ED   10/03/2020 0055 10/03/2020  0055 DNR [id number]  Vernelle Emerald, MD ED   09/05/2020 1039 09/07/2020 2311 DNR [id number]  Terrilee Croak, MD Inpatient   09/05/2020 0258 09/05/2020 1038 Full Code [id number]  Dwyane Dee, MD ED   07/31/2020 1758 08/04/2020 0323 Full Code [id number]  Bonnielee Haff, MD ED   10/08/2019 1815 10/09/2019 2042 Full Code [id number]  Cherylann Ratel A, DO Inpatient   04/08/2019 1316 04/12/2019 2132 Full Code [id number]  [name], DO ED   08/10/2018 1642 08/15/2018 1737 DNR [id number]  Annita Brod, MD Inpatient   01/14/2016 0946 01/18/2016 2226 DNR [id number]  Dory Horn, NP Inpatient   01/08/2016 1522 01/14/2016 0946 Full Code [id number]  Smiley Houseman, MD Inpatient   10/19/2015 2054 10/22/2015 1646 DNR [id number]  Aquilla Hacker, MD Inpatient   12/14/2014 1236 12/15/2014 1710 Full Code [id number]  Newt Minion, MD Inpatient   10/16/2014 1036 10/20/2014 1813 Full Code [id number]  Leone Brand, MD Inpatient   03/13/2014 0323 03/14/2014 2132 Full Code [id number]  Hilton Sinclair, MD Inpatient       Prognosis:  Unable to determine  Discharge Planning: Group home with palliative.   Care plan was discussed with  IDT  Thank you for allowing the Palliative Medicine Team to assist in the care of this patient.  Micheline Rough, MD  Please contact Palliative Medicine Team phone at [phone] for questions and concerns.

## 2021-06-06 NOTE — Progress Notes (Signed)
PROGRESS NOTE  [name] GEX:[id number] DOB: Jun 08, 1960 DOA: 05/22/2021 PCP: Audley Hose, MD  Brief History   [name] is a 61 y.o. male with medical history significant of chronic diastolic CHF, right foot amputee, iron deficiency anemia, anxiety, bipolar disorder, intellectual functional disability, obsessive-compulsive disorder, cerebral palsy, chronic bronchitis, constipation, environmental allergies, gastric ulcer with history of UGI bleed necessitating blood transfusion, GERD, hiatal hernia, history of varicella-zoster, hyperlipidemia, osteomyelitis of right foot, history of pneumonia, pressure ulcer of sacral area, history of Pseudomonas infection, recurrent UTIs, seborrheic dermatitis, vitamin D deficiency, functional quadriplegia who was brought from his group home due to productive cough for about a week.  Work up revealed community-acquired pneumonia with concern for possible aspiration, also revealed large B/L pleural effusions.  He was started on Rocephin and azithromycin empirically.  Post R thoracentesis by IR on 12/29 and post L thoracentesis by IR on 12/30.  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  He has had difficult to control pain.  After discussing with palliative and patient's sister, he has been transitioned to comfort measures.  Sister is at bedside. The patient is on a morphine drip. Palliative care is working with family regarding plans for comfort care. There is a follow up family meeting on 06/07/2021 at 11:00 am.  Consultants  Palliative care Interventional radiology Cardiology Wound care  Procedures  Thoracentesis  Antibiotics   Anti-infectives (From admission, onward)    Start     Dose/Rate Route Frequency Ordered Stop   06/03/21 2200  ceFAZolin (ANCEF) IVPB 2g/100 mL premix  Status:  Discontinued        2 g 200 mL/hr over 30 Minutes Intravenous  Every 8 hours 06/03/21 2051 06/05/21 1641   05/24/21 1200  azithromycin (ZITHROMAX) tablet 500 mg        500 mg Oral Daily 05/24/21 1054 05/26/21 1047   05/23/21 1500  cefTRIAXone (ROCEPHIN) 1 g in sodium chloride 0.9 % 100 mL IVPB        1 g 200 mL/hr over 30 Minutes Intravenous Every 24 hours 05/22/21 1603 05/27/21 1620   05/23/21 1500  azithromycin (ZITHROMAX) 500 mg in sodium chloride 0.9 % 250 mL IVPB  Status:  Discontinued        500 mg 250 mL/hr over 60 Minutes Intravenous Every 24 hours 05/22/21 1603 05/24/21 1054   05/22/21 1530  cefTRIAXone (ROCEPHIN) 1 g in sodium chloride 0.9 % 100 mL IVPB        1 g 200 mL/hr over 30 Minutes Intravenous  Once 05/22/21 1519 05/22/21 1600   05/22/21 1530  azithromycin (ZITHROMAX) 500 mg in sodium chloride 0.9 % 250 mL IVPB        500 mg 250 mL/hr over 60 Minutes Intravenous  Once 05/22/21 1519 05/22/21 1740      Subjective  The patient is resting comfortably. No new complaints. The sister at bedside is concerned about increased gurgling.  Objective   Vitals:  Vitals:   06/06/21 0301 06/06/21 1248  BP: 107/80 110/88  Pulse: 96 (!) 51  Resp: 14 18  Temp: 97.9 F (36.6 C) 97.9 F (36.6 C)  SpO2: 93% 95%    Exam:  Constitutional:  The patient is sleeping. Easily . No acute distress. Respiratory:  No increased work of breathing. No wheezes, rales, or rhonchi No tactile fremitus Cardiovascular:  Regular rate and rhythm No murmurs, ectopy, or gallups. No lateral  PROGRESS NOTE  [name] GEX:[id number] DOB: Jun 08, 1960 DOA: 05/22/2021 PCP: Audley Hose, MD  Brief History   [name] is a 61 y.o. male with medical history significant of chronic diastolic CHF, right foot amputee, iron deficiency anemia, anxiety, bipolar disorder, intellectual functional disability, obsessive-compulsive disorder, cerebral palsy, chronic bronchitis, constipation, environmental allergies, gastric ulcer with history of UGI bleed necessitating blood transfusion, GERD, hiatal hernia, history of varicella-zoster, hyperlipidemia, osteomyelitis of right foot, history of pneumonia, pressure ulcer of sacral area, history of Pseudomonas infection, recurrent UTIs, seborrheic dermatitis, vitamin D deficiency, functional quadriplegia who was brought from his group home due to productive cough for about a week.  Work up revealed community-acquired pneumonia with concern for possible aspiration, also revealed large B/L pleural effusions.  He was started on Rocephin and azithromycin empirically.  Post R thoracentesis by IR on 12/29 and post L thoracentesis by IR on 12/30.  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  He has had difficult to control pain.  After discussing with palliative and patient's sister, he has been transitioned to comfort measures.  Sister is at bedside. The patient is on a morphine drip. Palliative care is working with family regarding plans for comfort care. There is a follow up family meeting on 06/07/2021 at 11:00 am.  Consultants  Palliative care Interventional radiology Cardiology Wound care  Procedures  Thoracentesis  Antibiotics   Anti-infectives (From admission, onward)    Start     Dose/Rate Route Frequency Ordered Stop   06/03/21 2200  ceFAZolin (ANCEF) IVPB 2g/100 mL premix  Status:  Discontinued        2 g 200 mL/hr over 30 Minutes Intravenous  Every 8 hours 06/03/21 2051 06/05/21 1641   05/24/21 1200  azithromycin (ZITHROMAX) tablet 500 mg        500 mg Oral Daily 05/24/21 1054 05/26/21 1047   05/23/21 1500  cefTRIAXone (ROCEPHIN) 1 g in sodium chloride 0.9 % 100 mL IVPB        1 g 200 mL/hr over 30 Minutes Intravenous Every 24 hours 05/22/21 1603 05/27/21 1620   05/23/21 1500  azithromycin (ZITHROMAX) 500 mg in sodium chloride 0.9 % 250 mL IVPB  Status:  Discontinued        500 mg 250 mL/hr over 60 Minutes Intravenous Every 24 hours 05/22/21 1603 05/24/21 1054   05/22/21 1530  cefTRIAXone (ROCEPHIN) 1 g in sodium chloride 0.9 % 100 mL IVPB        1 g 200 mL/hr over 30 Minutes Intravenous  Once 05/22/21 1519 05/22/21 1600   05/22/21 1530  azithromycin (ZITHROMAX) 500 mg in sodium chloride 0.9 % 250 mL IVPB        500 mg 250 mL/hr over 60 Minutes Intravenous  Once 05/22/21 1519 05/22/21 1740      Subjective  The patient is resting comfortably. No new complaints. The sister at bedside is concerned about increased gurgling.  Objective   Vitals:  Vitals:   06/06/21 0301 06/06/21 1248  BP: 107/80 110/88  Pulse: 96 (!) 51  Resp: 14 18  Temp: 97.9 F (36.6 C) 97.9 F (36.6 C)  SpO2: 93% 95%    Exam:  Constitutional:  The patient is sleeping. Easily . No acute distress. Respiratory:  No increased work of breathing. No wheezes, rales, or rhonchi No tactile fremitus Cardiovascular:  Regular rate and rhythm No murmurs, ectopy, or gallups. No lateral  °PROGRESS NOTE ° °[name] MRN:[id number] DOB: 10/05/1960 DOA: 05/22/2021 °PCP: Bakare, Mobolaji B, MD ° °Brief History   °[name] is a 60 y.o. male with medical history significant of chronic diastolic CHF, right foot amputee, iron deficiency anemia, anxiety, bipolar disorder, intellectual functional disability, obsessive-compulsive disorder, cerebral palsy, chronic bronchitis, constipation, environmental allergies, gastric ulcer with history of UGI bleed necessitating blood transfusion, GERD, hiatal hernia, history of varicella-zoster, hyperlipidemia, osteomyelitis of right foot, history of pneumonia, pressure ulcer of sacral area, history of Pseudomonas infection, recurrent UTIs, seborrheic dermatitis, vitamin D deficiency, functional quadriplegia who was brought from his group home due to productive cough for about a week.  Work up revealed community-acquired pneumonia with concern for possible aspiration, also revealed large B/L pleural effusions.  He was started on Rocephin and azithromycin empirically.  Post R thoracentesis by IR on 12/29 and post L thoracentesis by IR on 12/30.  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  He has had difficult to control pain.  After discussing with palliative and patient's sister, he has been transitioned to comfort measures. ° °Sister is at bedside. The patient is on a morphine drip. Palliative care is working with family regarding plans for comfort care. There is a follow up family meeting on 06/07/2021 at 11:00 am. ° °Consultants  °Palliative care °Interventional radiology °Cardiology °Wound care ° °Procedures  °Thoracentesis ° °Antibiotics  ° °Anti-infectives (From admission, onward)  ° ° Start     Dose/Rate Route Frequency Ordered Stop  ° 06/03/21 2200  ceFAZolin (ANCEF) IVPB 2g/100 mL premix  Status:  Discontinued       ° 2 g °200 mL/hr over 30 Minutes Intravenous  Every 8 hours 06/03/21 2051 06/05/21 1641  ° 05/24/21 1200  azithromycin (ZITHROMAX) tablet 500 mg       ° 500 mg Oral Daily 05/24/21 1054 05/26/21 1047  ° 05/23/21 1500  cefTRIAXone (ROCEPHIN) 1 g in sodium chloride 0.9 % 100 mL IVPB       ° 1 g °200 mL/hr over 30 Minutes Intravenous Every 24 hours 05/22/21 1603 05/27/21 1620  ° 05/23/21 1500  azithromycin (ZITHROMAX) 500 mg in sodium chloride 0.9 % 250 mL IVPB  Status:  Discontinued       ° 500 mg °250 mL/hr over 60 Minutes Intravenous Every 24 hours 05/22/21 1603 05/24/21 1054  ° 05/22/21 1530  cefTRIAXone (ROCEPHIN) 1 g in sodium chloride 0.9 % 100 mL IVPB       ° 1 g °200 mL/hr over 30 Minutes Intravenous  Once 05/22/21 1519 05/22/21 1600  ° 05/22/21 1530  azithromycin (ZITHROMAX) 500 mg in sodium chloride 0.9 % 250 mL IVPB       ° 500 mg °250 mL/hr over 60 Minutes Intravenous  Once 05/22/21 1519 05/22/21 1740  ° °  ° °Subjective  °The patient is resting comfortably. No new complaints. The sister at bedside is concerned about increased gurgling. ° °Objective  ° °Vitals:  °Vitals:  ° 06/06/21 0301 06/06/21 1248  °BP: 107/80 110/88  °Pulse: 96 (!) 51  °Resp: 14 18  °Temp: 97.9 °F (36.6 °C) 97.9 °F (36.6 °C)  °SpO2: 93% 95%  ° ° °Exam: ° °Constitutional:  °The patient is sleeping. Easily . No acute distress. °Respiratory:  °No increased work of breathing. °No wheezes, rales, or rhonchi °No tactile fremitus °Cardiovascular:  °Regular rate and rhythm °No murmurs, ectopy, or gallups. °No lateral PMI.

## 2021-06-06 NOTE — Plan of Care (Signed)
  Problem: Pain Managment: Goal: General experience of comfort will improve Outcome: Progressing   

## 2021-06-07 DIAGNOSIS — Z7189 Other specified counseling: Secondary | ICD-10-CM | POA: Diagnosis not present

## 2021-06-07 DIAGNOSIS — R109 Unspecified abdominal pain: Secondary | ICD-10-CM | POA: Diagnosis not present

## 2021-06-07 DIAGNOSIS — R0600 Dyspnea, unspecified: Secondary | ICD-10-CM | POA: Diagnosis not present

## 2021-06-07 DIAGNOSIS — J189 Pneumonia, unspecified organism: Secondary | ICD-10-CM | POA: Diagnosis not present

## 2021-06-07 NOTE — TOC Progression Note (Signed)
Transition of Care [facility]) - Progression Note    Patient Details  Name: [name] MRN: [id number] Date of Birth: 12-Aug-1960  Transition of Care [facility]) CM/SW Contact  Purcell Mouton, RN Phone Number: 06/07/2021, 1:15 PM  Clinical Narrative:    Residential Hospice referral called to [facility]. Spoke with Marchia Meiers, RN intake, clinicals fax [phone].    Expected Discharge Plan: Group Home Barriers to Discharge: No Barriers Identified  Expected Discharge Plan and Services Expected Discharge Plan: Group Home       Living arrangements for the past 2 months:  (Group home)                                       Social Determinants of Health (SDOH) Interventions    Readmission Risk Interventions Readmission Risk Prevention Plan 08/03/2020  Post Dischage Appt Complete  Medication Screening Complete  Some recent data might be hidden

## 2021-06-07 NOTE — Progress Notes (Signed)
PROGRESS NOTE  [name] AVW:[id number] DOB: 28-Jun-1960 DOA: 05/22/2021 PCP: [place], MD  Brief History   [name] is a 61 y.o. male with medical history significant of chronic diastolic CHF, right foot amputee, iron deficiency anemia, anxiety, bipolar disorder, intellectual functional disability, obsessive-compulsive disorder, cerebral palsy, chronic bronchitis, constipation, environmental allergies, gastric ulcer with history of UGI bleed necessitating blood transfusion, GERD, hiatal hernia, history of varicella-zoster, hyperlipidemia, osteomyelitis of right foot, history of pneumonia, pressure ulcer of sacral area, history of Pseudomonas infection, recurrent UTIs, seborrheic dermatitis, vitamin D deficiency, functional quadriplegia who was brought from his group home due to productive cough for about a week.  Work up revealed community-acquired pneumonia with concern for possible aspiration, also revealed large B/L pleural effusions.  He was started on Rocephin and azithromycin empirically.  Post R thoracentesis by IR on 12/29 and post L thoracentesis by IR on 12/30.  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  He has had difficult to control pain.  After discussing with palliative and patient's sister, he has been transitioned to comfort measures.  Sister is at bedside. The patient is on a morphine drip. Palliative care is working with family regarding plans for comfort care. The family is hopeful that the patient can be transitioned to residential hospice for end of life care. They wish to have him accepted into [facility]. TOC has been consulted.  Scopalamine patch placed for upper airway secretions. Consultants  Palliative care Interventional radiology Cardiology Wound care  Procedures  Thoracentesis  Antibiotics   Anti-infectives (From admission, onward)     Start     Dose/Rate Route Frequency Ordered Stop   06/03/21 2200  ceFAZolin (ANCEF) IVPB 2g/100 mL premix  Status:  Discontinued        2 g 200 mL/hr over 30 Minutes Intravenous Every 8 hours 06/03/21 2051 06/05/21 1641   05/24/21 1200  azithromycin (ZITHROMAX) tablet 500 mg        500 mg Oral Daily 05/24/21 1054 05/26/21 1047   05/23/21 1500  cefTRIAXone (ROCEPHIN) 1 g in sodium chloride 0.9 % 100 mL IVPB        1 g 200 mL/hr over 30 Minutes Intravenous Every 24 hours 05/22/21 1603 05/27/21 1620   05/23/21 1500  azithromycin (ZITHROMAX) 500 mg in sodium chloride 0.9 % 250 mL IVPB  Status:  Discontinued        500 mg 250 mL/hr over 60 Minutes Intravenous Every 24 hours 05/22/21 1603 05/24/21 1054   05/22/21 1530  cefTRIAXone (ROCEPHIN) 1 g in sodium chloride 0.9 % 100 mL IVPB        1 g 200 mL/hr over 30 Minutes Intravenous  Once 05/22/21 1519 05/22/21 1600   05/22/21 1530  azithromycin (ZITHROMAX) 500 mg in sodium chloride 0.9 % 250 mL IVPB        500 mg 250 mL/hr over 60 Minutes Intravenous  Once 05/22/21 1519 05/22/21 1740      Subjective  The patient is resting comfortably. No new complaints.  Objective   Vitals:  Vitals:   06/06/21 1248 06/06/21 2341  BP: 110/88 133/72  Pulse: (!) 51 94  Resp: 18 20  Temp: 97.9 F (36.6 C) 98 F (36.7 C)  SpO2: 95% 98%   Exam:  Constitutional:  The patient is sleeping. Easily . No acute distress. Respiratory:  No increased work of breathing. No wheezes, rales, or rhonchi  No tactile fremitus Cardiovascular:  Regular rate and rhythm No murmurs, ectopy, or gallups. No lateral PMI. No thrills. Abdomen:  Abdomen is soft, non-tender, non-distended No hernias, masses, or organomegaly Normoactive bowel sounds.  Musculoskeletal:  No cyanosis, clubbing, or edema Skin:  No rashes, lesions, ulcers palpation of skin: no induration or nodules Neurologic:  Unable to evaluate due to the patient's inability to cooperate with exam.  I  have personally reviewed the following:   Today's Data  Vitals  Lab Data    Micro Data    Imaging    Cardiology Data    Other Data    Scheduled Meds:  chlorhexidine  15 mL Mouth Rinse BID   Chlorhexidine Gluconate Cloth  6 each Topical Daily   clotrimazole  1 application Topical BID   diclofenac Sodium  4 g Topical QID   hydrocortisone cream  1 application Topical BID   ketoconazole  1 application Topical Once per day on Mon Thu   mouth rinse  15 mL Mouth Rinse BID   risperiDONE  2 mg Oral QHS   scopolamine  3 patch Transdermal Q72H   Continuous Infusions:  morphine 3 mg/hr (06/07/21 0555)    Principal Problem:   Acute dyspnea Active Problems:   Infantile cerebral palsy (HCC)   Pressure ulcer of foot   Constipation   Functional quadriplegia (HCC)   Stage 3 skin ulcer of sacral region (HCC)   Microcytic anemia   Lactic acidosis   Hx of right BKA (HCC)   Dysphagia   Aspiration pneumonia (HCC)   Iron deficiency anemia   Protein-calorie malnutrition, severe   Chronic diastolic CHF (congestive heart failure) (HCC)   Malnutrition of moderate degree   Portal vein thrombosis   Bilateral pleural effusion   Hyperammonemia (HCC)   Superior mesenteric vein thrombosis (HCC)   LOS: 15 days   A & P  Resolved acute dyspnea secondary to community-acquired pneumonia with concern for recurrent aspiration and large B/L pleural effusions, s/p bilateral thoracentesis by IR. Speech therapist evaluated, recs reg consistency and thin fluids with aspiration precautions. Maintain oxygen saturation greater 92% No longer requiring oxygen supplementation post bilateral thoracentesis. Treat underlying conditions Completed 5 days of azithromycin Completed 5 days of Rocephin MRSA screening test positive Repeat imaging on 1/8 showed persistent pleural effusions Underwent repeat left thoracentesis 1/9 The patient is now comfort care.   Sacral Pressure wounds, present on  admission Continue local wound care with wound care specialist guidance. Patient does have some erythema around his wound, no drainage appreciated.  Started on Ancef for possible infection, but not discontinued based on goals of care.  See below. Continue pain control. The patient is now comfort care.  Acute portal vein, superior mesenteric vein, splenic vein thrombosis -Patient was complaining of persistent abdominal pain -Found to have thrombosis in abdominal vessels -Started on IV heparin, now discontinued based on goals of care.  See below. The patient is now comfort care.   Tachybradycardia syndrome suggesting underlying sinus node dysfunction -Noted on telemetry to have periods of atrial tachycardia as well as high degree AV block and possible complete heart block -He has been having periods of transient decrease in responsiveness which may be co-related -TSH checked and noted to be normal -Potassium and magnesium also normal -VBG did not show significant hypercarbia. -Seen by cardiology who did not recommend pacemaker.  Avoiding AV nodal agents was recommended The patient is now comfort care.   Hyperammonemia -Ammonia level 90 -He was started on  lactulose -Depakote dose was held, level was checked and noted to be subtherapeutic -He received a dose of L-carnitine -Follow-up level noted to be 40 The patient is now comfort care.   Chronic diastolic CHF Continue strict I's and O's and daily weight. The patient is now comfort care.   Infantile cerebral palsy Functional quadriplegia Muscle relaxers as needed Analgesics as needed The patient is now comfort care.   Iron deficiency anemia Hemoglobin has been stable Iron 19, ferritin 102. The patient is now comfort care.   Thrombocytosis -Likely reactive to underlying abdominal vein thrombosis The patient is now comfort care.   Severe protein calorie malnutrition Continue protein supplementation The patient is now  comfort care.   Chronic constipation He has been getting lactulose, Senokot, smog enemas. Despite having multiple bowel movements, CT imaging from 1/9 shows moderate volume of stool in colon. The patient is now comfort care.   Chronic indwelling foley catheter Facility confirms that patient has a chronic foley catheter Continue Flomax. The patient is now comfort care.   Goals of care DNR Palliative care team consulted to assist with establishing goals of care Patient has had difficulty to manage pain -He is routinely complaining of significant abdominal pain as well as pain in his sacral wound and visibly appears uncomfortable -When he is given pain medication, he frequently becomes lethargic -With underlying cardiac issues including sinus node dysfunction, thrombosis of abdominal veins requiring anticoagulation, persistent sacral wound which appears to be nonhealing, hypoalbuminemia and poor nutritional status functional status, his long-term prognosis appears to be very poor -I had an honest discussion with his legal guardian/sister Ms. [name] Fulcher and expressed my concerns regarding his overall quality of life/pain management and expected prognosis -I have recommended that she consider hospice for better Pain control and focus on his overall quality of life -[name] appears to be in agreement with this and would be interested in moving Bertram to a residential hospice facility for end-of-life care -Discussed with Dr. Neale Burly who also met with [name] -Patient is started on morphine infusion for pain management The patient is now comfort care..  I have seen and examined this patient myself. I have spent 34 minutes in his evaluation and care.   DVT Prophylaxis: The patient is now comfort care. Code Status: DNR, comfort measures Family Communication: Updated patient's guardian/sister, Ms. Lynda Rainwater Disposition Plan: Possibly residential hospice   Ladell Bey, DO [facility] Direct contact: see [email or web address]  7PM-7AM contact night coverage as above 06/07/2021, 4:41 PM  LOS: 14 days

## 2021-06-07 NOTE — Care Management Important Message (Signed)
Important Message  Patient Details IM Letter placed in Patients room. Name: [name] MRN: [id number] Date of Birth: [birth year]   Medicare Important Message Given:  Yes     Kerin Salen 06/07/2021, 12:39 PM

## 2021-06-07 NOTE — Progress Notes (Signed)
Nutrition Brief Note  Patient last assessed by a RD on 1/5.  Chart reviewed. Palliative Care following and has a follow-up meeting with family set for 1100 today. MD note from this AM states that patient has been transitioned to comfort care and that he is on a morphine drip.   No further nutrition interventions planned at this time. Please re-consult as needed.       Trenton Gammon, MS, RD, LDN Inpatient Clinical Dietitian RD pager # available in AMION  After hours/weekend pager # available in [facility]

## 2021-06-07 NOTE — Progress Notes (Signed)
Daily Progress Note   Patient Name: [name]       Date: 06/07/2021 DOB: [birth year]  Age: 61 y.o. MRN#: [id number] Attending Physician: Karie Kirks, DO Primary Care Physician: Audley Hose, MD Admit Date: 05/22/2021  Reason for Consultation/Follow-up: Establishing goals of care  Subjective: I saw and examined [name] today.  He was lying in bed in no distress.  He is awake but remains very weak.  He has audible secretions.  Denies pain.  States he is "dry" but when I tried to get him to take a drink he was unable to participate.  Attempted mouth care.  I then met today with patient's family including his sister [facility] POA), [name], his nephew, Yvone Neu, and Elwin Sleight and Velna Hatchet from the group home where he has lived for the last 30 years.  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  While he is still awake and interactive, he has not really been eating and drinking.  He has significant pain and this has been better since starting him on a continuous infusion of morphine, however, he still has periods where he is moaning pain.  Overall, we discussed that he has multiple issues that are likely to lead to his death in the near future.  In seeing him on examination today, I believe he also continues to aspirate.  As noted he is awake and is interactive, but he is growing weaker and less awake at times.  We discussed recommendation for hospice for [name] moving forward.  We reviewed options for hospice care.  His group home unfortunately cannot take him back and meet his care needs.  We discussed  options of long-term care with hospice support versus residential hospice.  His sister lives in [place] and is hopeful he can be considered for transition to residential hospice in [place] as this will be closer to them.  Length of Stay: 15  Current Medications: Scheduled Meds:   chlorhexidine  15 mL Mouth Rinse BID   Chlorhexidine Gluconate Cloth  6 each Topical Daily   clotrimazole  1 application Topical BID   diclofenac Sodium  4 g Topical QID   hydrocortisone cream  1 application Topical  BID   ketoconazole  1 application Topical Once per day on Mon Thu   mouth rinse  15 mL Mouth Rinse BID   risperiDONE  2 mg Oral QHS   scopolamine  3 patch Transdermal Q72H    Continuous Infusions:  morphine 3 mg/hr (06/07/21 0555)    PRN Meds: acetaminophen **OR** acetaminophen, albuterol, alum & mag hydroxide-simeth, bisacodyl, glycopyrrolate, guaiFENesin-dextromethorphan, haloperidol lactate, lactulose, liver oil-zinc oxide, LORazepam, morphine, ondansetron **OR** ondansetron (ZOFRAN) IV  Physical Exam         Resting in bed No distress.  Opens eyes and answers a couple of simple questions when asked repeatedly. S 1 S 2  Regular work of breathing.  Secretions noted   Vital Signs: BP 133/72 (BP Location: Left Wrist)    Pulse 94    Temp 98 F (36.7 C) (Oral)    Resp 20    Ht 6' (1.829 m)    Wt 103.4 kg    SpO2 98%    BMI 30.92 kg/m  SpO2: SpO2: 98 % O2 Device: O2 Device: Room Air O2 Flow Rate: O2 Flow Rate (L/min): 3 L/min  Intake/output summary:  Intake/Output Summary (Last 24 hours) at 06/07/2021 1157 Last data filed at 06/07/2021 0555 Gross per 24 hour  Intake 62.67 ml  Output 700 ml  Net -637.33 ml    LBM: Last BM Date: 06/05/21 Baseline Weight: Weight: 59.8 kg Most recent weight: Weight: 103.4 kg       Palliative Assessment/Data:      Patient Active Problem List   Diagnosis Date Noted   Portal vein thrombosis 06/05/2021   Bilateral pleural effusion  06/05/2021   Hyperammonemia ([place]) 06/05/2021   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   Malnutrition of moderate degree 05/31/2021   Acute dyspnea 05/22/2021   Acute on chronic diastolic CHF (congestive heart failure) ([place]) 05/22/2021   Chronic diastolic CHF (congestive heart failure) ([place]) 05/22/2021   Protein-calorie malnutrition, severe 02/28/2021   Iron deficiency anemia 02/20/2021   Obsessive compulsive disorder    Cellulitis of sacral region 12/06/2020   Right pulmonary infiltrate on CXR 10/03/2020   Dysphagia 10/03/2020   GERD without esophagitis 10/03/2020   Aspiration pneumonia ([place]) 10/03/2020   Recurrent right pleural effusion 10/02/2020   Abnormal CT scan, colon    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   Nausea and vomiting 07/31/2020   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 ([place]) 10/08/2019   Osteomyelitis ([place]) 10/08/2019   Acute osteomyelitis of right ankle or foot ([place])    Ulcerated, foot, right, with necrosis of bone ([place])    Pseudomonas aeruginosa infection 09/06/2019   MRSA infection 09/06/2019   Lactic acidosis    Stage 3 skin ulcer of sacral region ([place]) 08/10/2018   Gastric wall thickening 08/10/2018   Alkaline phosphatase elevation 08/10/2018   Cholelithiasis 08/10/2018   Microcytic anemia    Hiatal hernia 04/07/2018   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 ([place]) 01/20/2016   Functional quadriplegia ([place]) 12/15/2015   Cerebral palsy, quadriplegic ([place])    Pre-diabetes 08/31/2015   Foot ulcer, limited to breakdown of skin ([place]) 06/08/2015   Constipation 11/01/2014   Seasonal allergies 11/01/2014   Pressure ulcer of foot 03/14/2014   CAP (community acquired pneumonia) 03/13/2014   Neuropathy 01/13/2013   Decubitus ulcer of left hip, stage 3 ([place]) 01/08/2013   URINARY INCONTINENCE 02/09/2010   Intellectual disability 07/24/2006   Infantile cerebral palsy ([place]) 07/24/2006    Palliative Care Assessment & Plan  Patient Profile:     Assessment:  61 year old gentleman from a group home, history of chronic diastolic congestive heart failure, history of right foot amputation iron deficiency anemia anxiety bipolar disorder, OCD, cerebral palsy, chronic bronchitis constipation allergies, history of gastric ulcer history of GI bleed, GERD hiatal hernia dyslipidemia history of pressure ulcer of sacral area, history of Pseudomonas infection and recurrent urinary tract infections.  Patient has functional quadriplegia.  Brought in from his group home due to productive cough, work-up revealed community-acquired pneumonia with concern for possible aspiration as well as large bilateral pleural effusions.  Patient admitted to hospital medicine service, started on antibiotics, also underwent a right thoracenteses and a left thoracenteses. Ongoing issues this hospitalization include concern for recurrent aspiration, speech therapist have been following and they have recommended for only comfort feeds.   Recommendations/Plan: Pain: Continue morphine infusion at 3 mg/h.  Continue breakthrough dosing of 3 mg every 30 minutes as needed.  We will continue to follow closely and titrate as needed to ensure his comfort.   Family is hopeful for transition to residential hospice for end-of-life care.  Preference for residential hospice in [place] placed to transition of care team to help facilitate.   Code Status:    Code Status Orders  (From admission, onward)           Start     Ordered   05/22/21 1709  Do not attempt resuscitation (DNR)  Continuous       Question Answer Comment  In the event of cardiac or respiratory ARREST Do not call a code blue   In the event of cardiac or respiratory ARREST Do not perform Intubation, CPR, defibrillation or ACLS   In the event of cardiac or respiratory ARREST Use medication by any route, position, wound care, and other measures to relive pain and suffering. May use oxygen, suction and  manual treatment of airway obstruction as needed for comfort.      05/22/21 1711           Code Status History     Date Active Date Inactive Code Status Order ID Comments User Context   05/22/2021 1604 05/22/2021 1711 Full Code [id number]  [name], MD ED   02/21/2021 0648 03/03/2021 2347 Full Code [id number]  Ivor Costa, MD ED   02/20/2021 1801 02/21/2021 0648 DNR [id number]  Ivor Costa, MD ED   12/06/2020 2258 12/16/2020 0713 Full Code [id number]  Tacey Ruiz, MD Inpatient   10/03/2020 0055 10/06/2020 2346 DNR [id number]  Vernelle Emerald, MD ED   10/03/2020 0055 10/03/2020 0055 DNR [id number]  Vernelle Emerald, MD ED   09/05/2020 1039 09/07/2020 2311 DNR [id number]  Terrilee Croak, MD Inpatient   09/05/2020 0258 09/05/2020 1038 Full Code [id number]  Dwyane Dee, MD ED   07/31/2020 1758 08/04/2020 0323 Full Code [id number]  Bonnielee Haff, MD ED   10/08/2019 1815 10/09/2019 2042 Full Code [id number]  Cherylann Ratel A, DO Inpatient   04/08/2019 1316 04/12/2019 2132 Full Code [id number]  [name], DO ED   08/10/2018 1642 08/15/2018 1737 DNR [id number]  Annita Brod, MD Inpatient   01/14/2016 0946 01/18/2016 2226 DNR [id number]  Dory Horn, NP Inpatient   01/08/2016 1522 01/14/2016 0946 Full Code [id number]  Smiley Houseman, MD Inpatient   10/19/2015 2054 10/22/2015 1646 DNR [id number]  Aquilla Hacker, MD Inpatient   12/14/2014 1236 12/15/2014 1710 Full Code [id number]  Newt Minion, MD Inpatient   10/16/2014 1036 10/20/2014  [place] Full Code [id number]  Leone Brand, MD Inpatient   03/13/2014 8206 03/14/2014 2132 Full Code [id number]  Hilton Sinclair, MD Inpatient       Prognosis: Weeks, possibly < 2 weeks.  [name] has multiple medical problems with possibility of sudden cardiac death from heart block and tachybradycardia syndrome.  Overall, he has continued to decline and intake has become minimal.  Izaah has told his family that he is tired and has been talking about  dying.  He was admitted with aspiration pneumonia (that was treated this admission with antibiotics), but he also continues to aspirate at this time and I am sure this is worsening again.  He has significant pain and is on a continuous infusion of morphine.  This may contribute to sleepiness and increased intake, however, he has and restless without adequate analgesia.  Clear goal is to focus on comfort if he continues as he approaches end-of-life.    Discharge Planning: Residential hospice  Care plan was discussed with  IDT  Thank you for allowing the Palliative Medicine Team to assist in the care of this patient.  Micheline Rough, MD  Please contact Palliative Medicine Team phone at [phone] for questions and concerns.

## 2021-06-08 DIAGNOSIS — R0600 Dyspnea, unspecified: Secondary | ICD-10-CM | POA: Diagnosis not present

## 2021-06-08 LAB — CYTOLOGY - NON PAP

## 2021-06-08 NOTE — Progress Notes (Signed)
PROGRESS NOTE  [name] YTK:[id number] DOB: 04-Oct-1960 DOA: 05/22/2021 PCP: Audley Hose, MD  Brief History   [name] is a 61 y.o. male with medical history significant of chronic diastolic CHF, right foot amputee, iron deficiency anemia, anxiety, bipolar disorder, intellectual functional disability, obsessive-compulsive disorder, cerebral palsy, chronic bronchitis, constipation, environmental allergies, gastric ulcer with history of UGI bleed necessitating blood transfusion, GERD, hiatal hernia, history of varicella-zoster, hyperlipidemia, osteomyelitis of right foot, history of pneumonia, pressure ulcer of sacral area, history of Pseudomonas infection, recurrent UTIs, seborrheic dermatitis, vitamin D deficiency, functional quadriplegia who was brought from his group home due to productive cough for about a week.  Work up revealed community-acquired pneumonia with concern for possible aspiration, also revealed large B/L pleural effusions.  He was started on Rocephin and azithromycin empirically.  Post R thoracentesis by IR on 12/29 and post L thoracentesis by IR on 12/30.  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  He has had difficult to control pain.  After discussing with palliative and patient's sister, he has been transitioned to comfort measures.  Sister is at bedside. The patient is on a morphine drip. Palliative care is working with family regarding plans for comfort care. The family is hopeful that the patient can be transitioned to residential hospice for end of life care. They wish to have him accepted into [facility] of [facility]. TOC has been consulted.  Scopalamine patch placed for upper airway secretions. Consultants  Palliative care Interventional radiology Cardiology Wound care  Procedures  Thoracentesis  Antibiotics   Anti-infectives (From admission, onward)     Start     Dose/Rate Route Frequency Ordered Stop   06/03/21 2200  ceFAZolin (ANCEF) IVPB 2g/100 mL premix  Status:  Discontinued        2 g 200 mL/hr over 30 Minutes Intravenous Every 8 hours 06/03/21 2051 06/05/21 1641   05/24/21 1200  azithromycin (ZITHROMAX) tablet 500 mg        500 mg Oral Daily 05/24/21 1054 05/26/21 1047   05/23/21 1500  cefTRIAXone (ROCEPHIN) 1 g in sodium chloride 0.9 % 100 mL IVPB        1 g 200 mL/hr over 30 Minutes Intravenous Every 24 hours 05/22/21 1603 05/27/21 1620   05/23/21 1500  azithromycin (ZITHROMAX) 500 mg in sodium chloride 0.9 % 250 mL IVPB  Status:  Discontinued        500 mg 250 mL/hr over 60 Minutes Intravenous Every 24 hours 05/22/21 1603 05/24/21 1054   05/22/21 1530  cefTRIAXone (ROCEPHIN) 1 g in sodium chloride 0.9 % 100 mL IVPB        1 g 200 mL/hr over 30 Minutes Intravenous  Once 05/22/21 1519 05/22/21 1600   05/22/21 1530  azithromycin (ZITHROMAX) 500 mg in sodium chloride 0.9 % 250 mL IVPB        500 mg 250 mL/hr over 60 Minutes Intravenous  Once 05/22/21 1519 05/22/21 1740      Subjective  The patient is resting comfortably. No new complaints.  Objective   Vitals:  Vitals:   06/06/21 1248 06/06/21 2341  BP: 110/88 133/72  Pulse: (!) 51 94  Resp: 18 20  Temp: 97.9 F (36.6 C) 98 F (36.7 C)  SpO2: 95% 98%   Exam:  Constitutional:  The patient is sleeping. Easily . No acute distress. Respiratory:  No increased work of breathing. No wheezes, rales, or rhonchi  No tactile fremitus Cardiovascular:  Regular rate and rhythm No murmurs, ectopy, or gallups. No lateral PMI. No thrills. Abdomen:  Abdomen is soft, non-tender, non-distended No hernias, masses, or organomegaly Normoactive bowel sounds.  Musculoskeletal:  No cyanosis, clubbing, or edema Skin:  No rashes, lesions, ulcers palpation of skin: no induration or nodules Neurologic:  Unable to evaluate due to the patient's inability to cooperate with exam.  I  have personally reviewed the following:   [place]    Cardiology Data    Other Data    Scheduled Meds:  chlorhexidine  15 mL Mouth Rinse BID   Chlorhexidine Gluconate Cloth  6 each Topical Daily   clotrimazole  1 application Topical BID   diclofenac Sodium  4 g Topical QID   hydrocortisone cream  1 application Topical BID   ketoconazole  1 application Topical Once per day on Mon Thu   mouth rinse  15 mL Mouth Rinse BID   risperiDONE  2 mg Oral QHS   scopolamine  3 patch Transdermal Q72H   Continuous Infusions:  morphine 3 mg/hr (06/08/21 0322)    Principal Problem:   Acute dyspnea Active Problems:   Infantile cerebral palsy (HCC)   Pressure ulcer of foot   Constipation   Functional quadriplegia (HCC)   Stage 3 skin ulcer of sacral region (HCC)   Microcytic anemia   Lactic acidosis   Hx of right BKA (HCC)   Dysphagia   Aspiration pneumonia (HCC)   Iron deficiency anemia   Protein-calorie malnutrition, severe   Chronic diastolic CHF (congestive heart failure) (HCC)   Malnutrition of moderate degree   Portal vein thrombosis   Bilateral pleural effusion   Hyperammonemia (HCC)   Superior mesenteric vein thrombosis (HCC)   LOS: 16 days   A & P  Resolved acute dyspnea secondary to community-acquired pneumonia with concern for recurrent aspiration and large B/L pleural effusions, s/p bilateral thoracentesis by IR. Speech therapist evaluated, recs reg consistency and thin fluids with aspiration precautions. Maintain oxygen saturation greater 92% No longer requiring oxygen supplementation post bilateral thoracentesis. Treat underlying conditions Completed 5 days of azithromycin Completed 5 days of Rocephin MRSA screening test positive Repeat imaging on 1/8 showed persistent pleural effusions Underwent repeat left thoracentesis 1/9 The patient is now comfort care.   Sacral Pressure wounds, present on  admission Continue local wound care with wound care specialist guidance. Patient does have some erythema around his wound, no drainage appreciated.  Started on Ancef for possible infection, but not discontinued based on goals of care.  See below. Continue pain control. The patient is now comfort care.  Acute portal vein, superior mesenteric vein, splenic vein thrombosis -Patient was complaining of persistent abdominal pain -Found to have thrombosis in abdominal vessels -Started on IV heparin, now discontinued based on goals of care.  See below. The patient is now comfort care.   Tachybradycardia syndrome suggesting underlying sinus node dysfunction -Noted on telemetry to have periods of atrial tachycardia as well as high degree AV block and possible complete heart block -He has been having periods of transient decrease in responsiveness which may be co-related -TSH checked and noted to be normal -Potassium and magnesium also normal -VBG did not show significant hypercarbia. -Seen by cardiology who did not recommend pacemaker.  Avoiding AV nodal agents was recommended The patient is now comfort care.   Hyperammonemia -Ammonia level 90 -He was started on  lactulose -Depakote dose was held, level was checked and noted to be subtherapeutic -He received a dose of L-carnitine -Follow-up level noted to be 40 The patient is now comfort care.   Chronic diastolic CHF Continue strict I's and O's and daily weight. The patient is now comfort care.   Infantile cerebral palsy Functional quadriplegia Muscle relaxers as needed Analgesics as needed The patient is now comfort care.   Iron deficiency anemia Hemoglobin has been stable Iron 19, ferritin 102. The patient is now comfort care.   Thrombocytosis -Likely reactive to underlying abdominal vein thrombosis The patient is now comfort care.   Severe protein calorie malnutrition Continue protein supplementation The patient is now  comfort care.   Chronic constipation He has been getting lactulose, Senokot, smog enemas. Despite having multiple bowel movements, CT imaging from 1/9 shows moderate volume of stool in colon. The patient is now comfort care.   Chronic indwelling foley catheter Facility confirms that patient has a chronic foley catheter Continue Flomax. The patient is now comfort care.   Goals of care DNR Palliative care team consulted to assist with establishing goals of care Patient has had difficulty to manage pain -He is routinely complaining of significant abdominal pain as well as pain in his sacral wound and visibly appears uncomfortable -When he is given pain medication, he frequently becomes lethargic -With underlying cardiac issues including sinus node dysfunction, thrombosis of abdominal veins requiring anticoagulation, persistent sacral wound which appears to be nonhealing, hypoalbuminemia and poor nutritional status functional status, his long-term prognosis appears to be very poor -I had an honest discussion with his legal guardian/sister [name] and expressed my concerns regarding his overall quality of life/pain management and expected prognosis -I have recommended that she consider hospice for better Pain control and focus on his overall quality of life -[name] appears to be in agreement with this and would be interested in moving [place] to a residential hospice facility for end-of-life care -Discussed with Dr. Domingo Cocking who also met with [name] -Patient is started on morphine infusion for pain management The patient is now comfort care..  I have seen and examined this patient myself. I have spent 32 minutes in his evaluation and care.   DVT Prophylaxis: The patient is now comfort care. Code Status: DNR, comfort measures Family Communication: Updated patient's guardian/sister, [name] Disposition Plan: Referral has been made [facility],  DO [facility] Direct contact: see [email or web address]  7PM-7AM contact night coverage as above 06/08/2021, 1:56 PM  LOS: 14 days

## 2021-06-09 MED ORDER — BISACODYL 10 MG RE SUPP
10.0000 mg | Freq: Every day | RECTAL | Status: DC | PRN
  Administered 2021-06-09 – 2021-06-21 (×2): 10 mg via RECTAL
  Filled 2021-06-09 (×2): qty 1

## 2021-06-09 MED ORDER — LACTULOSE 10 GM/15ML PO SOLN
20.0000 g | Freq: Every day | ORAL | Status: DC
  Administered 2021-06-10 – 2021-06-21 (×8): 20 g via ORAL
  Filled 2021-06-09 (×10): qty 30

## 2021-06-09 NOTE — Progress Notes (Signed)
PROGRESS NOTE  RODOLPH HAGEMANN TDV:[id number] DOB: June 22, 1960 DOA: 05/22/2021 PCP: Audley Hose, MD  Brief History   CARLE FENECH is a 61 y.o. male with medical history significant of chronic diastolic CHF, right foot amputee, iron deficiency anemia, anxiety, bipolar disorder, intellectual functional disability, obsessive-compulsive disorder, cerebral palsy, chronic bronchitis, constipation, environmental allergies, gastric ulcer with history of UGI bleed necessitating blood transfusion, GERD, hiatal hernia, history of varicella-zoster, hyperlipidemia, osteomyelitis of right foot, history of pneumonia, pressure ulcer of sacral area, history of Pseudomonas infection, recurrent UTIs, seborrheic dermatitis, vitamin D deficiency, functional quadriplegia who was brought from his group home due to productive cough for about a week.  Work up revealed community-acquired pneumonia with concern for possible aspiration, also revealed large B/L pleural effusions.  He was started on Rocephin and azithromycin empirically.  Post R thoracentesis by IR on 12/29 and post L thoracentesis by IR on 12/30.  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  He has had difficult to control pain.  After discussing with palliative and patient's sister, he has been transitioned to comfort measures.  Sister is at bedside. The patient is on a morphine drip. Palliative care is working with family regarding plans for comfort care. The family is hopeful that the patient can be transitioned to residential hospice for end of life care. They wish to have him accepted into [facility]. TOC has been consulted.  Scopalamine patch placed for upper airway secretions. Consultants  Palliative care Interventional radiology Cardiology Wound care  Procedures  Thoracentesis  Antibiotics   Anti-infectives (From admission, onward)     Start     Dose/Rate Route Frequency Ordered Stop   06/03/21 2200  ceFAZolin (ANCEF) IVPB 2g/100 mL premix  Status:  Discontinued        2 g 200 mL/hr over 30 Minutes Intravenous Every 8 hours 06/03/21 2051 06/05/21 1641   05/24/21 1200  azithromycin (ZITHROMAX) tablet 500 mg        500 mg Oral Daily 05/24/21 1054 05/26/21 1047   05/23/21 1500  cefTRIAXone (ROCEPHIN) 1 g in sodium chloride 0.9 % 100 mL IVPB        1 g 200 mL/hr over 30 Minutes Intravenous Every 24 hours 05/22/21 1603 05/27/21 1620   05/23/21 1500  azithromycin (ZITHROMAX) 500 mg in sodium chloride 0.9 % 250 mL IVPB  Status:  Discontinued        500 mg 250 mL/hr over 60 Minutes Intravenous Every 24 hours 05/22/21 1603 05/24/21 1054   05/22/21 1530  cefTRIAXone (ROCEPHIN) 1 g in sodium chloride 0.9 % 100 mL IVPB        1 g 200 mL/hr over 30 Minutes Intravenous  Once 05/22/21 1519 05/22/21 1600   05/22/21 1530  azithromycin (ZITHROMAX) 500 mg in sodium chloride 0.9 % 250 mL IVPB        500 mg 250 mL/hr over 60 Minutes Intravenous  Once 05/22/21 1519 05/22/21 1740      Subjective  The patient is eating. No new complaints. Family is at bedside. Objective   Vitals:  Vitals:   06/06/21 2341 06/09/21 1148  BP: 133/72 (!) 179/83  Pulse: 94 (!) 103  Resp: 20 19  Temp: 98 F (36.7 C) 98.9 F (37.2 C)  SpO2: 98% 96%   Exam:  Constitutional:  The patient is sleeping. Easily . No acute distress. Respiratory:  No increased work of breathing. No wheezes,  rales, or rhonchi No tactile fremitus Cardiovascular:  Regular rate and rhythm No murmurs, ectopy, or gallups. No lateral PMI. No thrills. Abdomen:  Abdomen is soft, non-tender, non-distended No hernias, masses, or organomegaly Normoactive bowel sounds.  Musculoskeletal:  No cyanosis, clubbing, or edema Skin:  No rashes, lesions, ulcers palpation of skin: no induration or nodules Neurologic:  Unable to evaluate due to the patient's inability to cooperate with  exam.  I have personally reviewed the following:   [place]    Cardiology Data    Other Data    Scheduled Meds:  chlorhexidine  15 mL Mouth Rinse BID   Chlorhexidine Gluconate Cloth  6 each Topical Daily   clotrimazole  1 application Topical BID   diclofenac Sodium  4 g Topical QID   hydrocortisone cream  1 application Topical BID   ketoconazole  1 application Topical Once per day on Mon Thu   [START ON 06/10/2021] lactulose  20 g Oral Daily   mouth rinse  15 mL Mouth Rinse BID   risperiDONE  2 mg Oral QHS   scopolamine  3 patch Transdermal Q72H    morphine 4 mg/hr (06/09/21 0539)    Principal Problem:   Acute dyspnea Active Problems:   Infantile cerebral palsy (HCC)   Pressure ulcer of foot   Constipation   Functional quadriplegia (HCC)   Stage 3 skin ulcer of sacral region (HCC)   Microcytic anemia   Lactic acidosis   Hx of right BKA (HCC)   Dysphagia   Aspiration pneumonia (HCC)   Iron deficiency anemia   Protein-calorie malnutrition, severe   Chronic diastolic CHF (congestive heart failure) (HCC)   Malnutrition of moderate degree   Portal vein thrombosis   Bilateral pleural effusion   Hyperammonemia (HCC)   Superior mesenteric vein thrombosis (HCC)   LOS: 17 days   A & P  Resolved acute dyspnea secondary to community-acquired pneumonia with concern for recurrent aspiration and large B/L pleural effusions, s/p bilateral thoracentesis by IR. Speech therapist evaluated, recs reg consistency and thin fluids with aspiration precautions. Maintain oxygen saturation greater 92% No longer requiring oxygen supplementation post bilateral thoracentesis. Treat underlying conditions Completed 5 days of azithromycin Completed 5 days of Rocephin MRSA screening test positive Repeat imaging on 1/8 showed persistent pleural effusions Underwent repeat left thoracentesis 1/9 The patient is now comfort care.    Sacral Pressure wounds, present on admission Continue local wound care with wound care specialist guidance. Patient does have some erythema around his wound, no drainage appreciated.  Started on Ancef for possible infection, but not discontinued based on goals of care.  See below. Continue pain control. The patient is now comfort care.  Acute portal vein, superior mesenteric vein, splenic vein thrombosis -Patient was complaining of persistent abdominal pain -Found to have thrombosis in abdominal vessels -Started on IV heparin, now discontinued based on goals of care.  See below. The patient is now comfort care.   Tachybradycardia syndrome suggesting underlying sinus node dysfunction -Noted on telemetry to have periods of atrial tachycardia as well as high degree AV block and possible complete heart block -He has been having periods of transient decrease in responsiveness which may be co-related -TSH checked and noted to be normal -Potassium and magnesium also normal -VBG did not show significant hypercarbia. -Seen by cardiology who did not recommend pacemaker.  Avoiding AV nodal agents was recommended The patient is now  comfort care.   Hyperammonemia -Ammonia level 90 -He was started on lactulose -Depakote dose was held, level was checked and noted to be subtherapeutic -He received a dose of L-carnitine -Follow-up level noted to be 40 The patient is now comfort care.   Chronic diastolic CHF Continue strict I's and O's and daily weight. The patient is now comfort care.   Infantile cerebral palsy Functional quadriplegia Muscle relaxers as needed Analgesics as needed The patient is now comfort care.   Iron deficiency anemia Hemoglobin has been stable Iron 19, ferritin 102. The patient is now comfort care.   Thrombocytosis -Likely reactive to underlying abdominal vein thrombosis The patient is now comfort care.   Severe protein calorie malnutrition Continue protein  supplementation The patient is now comfort care.   Chronic constipation He has been getting lactulose, Senokot, smog enemas. Despite having multiple bowel movements, CT imaging from 1/9 shows moderate volume of stool in colon. The patient is now comfort care.   Chronic indwelling foley catheter Facility confirms that patient has a chronic foley catheter Continue Flomax. The patient is now comfort care.   Goals of care DNR Palliative care team consulted to assist with establishing goals of care Patient has had difficulty to manage pain -He is routinely complaining of significant abdominal pain as well as pain in his sacral wound and visibly appears uncomfortable -When he is given pain medication, he frequently becomes lethargic -With underlying cardiac issues including sinus node dysfunction, thrombosis of abdominal veins requiring anticoagulation, persistent sacral wound which appears to be nonhealing, hypoalbuminemia and poor nutritional status functional status, his long-term prognosis appears to be very poor -I had an honest discussion with his legal guardian/sister Ms. [name] Fulcher and expressed my concerns regarding his overall quality of life/pain management and expected prognosis -I have recommended that she consider hospice for better Pain control and focus on his overall quality of life -[name] appears to be in agreement with this and would be interested in moving [place] to a residential hospice facility for end-of-life care -Discussed with Dr. Domingo Cocking who also met with [name] -Patient is started on morphine infusion for pain management The patient is now comfort care..  I have seen and examined this patient myself. I have spent 32 minutes in his evaluation and care.   DVT Prophylaxis: The patient is now comfort care. Code Status: DNR, comfort measures Family Communication: Updated patient's guardian/sister, Ms. Cinda Quest Disposition Plan: Referral has been made [facility], DO [facility] Direct contact: see [email or web address]  7PM-7AM contact night coverage as above 06/09/2021, 4:35 PM  LOS: 14 days

## 2021-06-09 NOTE — Progress Notes (Signed)
Daily Progress Note   Patient Name: [name]       Date: 06/09/2021 DOB: 05-07-1961  Age: 61 y.o. MRN#: [id number] Attending Physician: Fran Lowes, DO Primary Care Physician: Harvest Forest, MD Admit Date: 05/22/2021  Reason for Consultation/Follow-up: Establishing goals of care  Subjective: I saw and examined [name] today.  He was more awake today, but he complains that his butt is hurting.    Discussed with sister at bedside.  While he is a little more interactive today, he still is not maintaining nutrition and hydration.  She understands and agrees that residential hospice is the best plan for him moving forward.  She notes that [name] understands what is going on as well as he has been saying that he is dying.  Length of Stay: 17  Current Medications: Scheduled Meds:   chlorhexidine  15 mL Mouth Rinse BID   Chlorhexidine Gluconate Cloth  6 each Topical Daily   clotrimazole  1 application Topical BID   diclofenac Sodium  4 g Topical QID   hydrocortisone cream  1 application Topical BID   ketoconazole  1 application Topical Once per day on Mon Thu   mouth rinse  15 mL Mouth Rinse BID   risperiDONE  2 mg Oral QHS   scopolamine  3 patch Transdermal Q72H    Continuous Infusions:  morphine 4 mg/hr (06/09/21 0539)    PRN Meds: acetaminophen **OR** acetaminophen, albuterol, alum & mag hydroxide-simeth, bisacodyl, glycopyrrolate, guaiFENesin-dextromethorphan, haloperidol lactate, lactulose, liver oil-zinc oxide, LORazepam, morphine, ondansetron **OR** ondansetron (ZOFRAN) IV  Physical Exam         Resting in bed No distress.  Opens eyes and answers a couple of simple questions.  He is more alert today. S 1 S 2  Regular work of breathing.  Secretions noted   Vital  Signs: BP 133/72 (BP Location: Left Wrist)    Pulse 94    Temp 98 F (36.7 C) (Oral)    Resp 20    Ht 6' (1.829 m)    Wt 103.4 kg    SpO2 98%    BMI 30.92 kg/m  SpO2: SpO2: 98 % O2 Device: O2 Device: Room Air O2 Flow Rate: O2 Flow Rate (L/min): 3 L/min  Intake/output summary:  Intake/Output Summary (Last 24 hours) at 06/09/2021 0919 Last  data filed at 06/09/2021 0600 Gross per 24 hour  Intake 156.74 ml  Output 3050 ml  Net -2893.26 ml    LBM: Last BM Date: 06/05/21 Baseline Weight: Weight: 59.8 kg Most recent weight: Weight: 103.4 kg       Palliative Assessment/Data:      Patient Active Problem List   Diagnosis Date Noted   Portal vein thrombosis 06/05/2021   Bilateral pleural effusion 06/05/2021   Hyperammonemia (HCC) 06/05/2021   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   Malnutrition of moderate degree 05/31/2021   Acute dyspnea 05/22/2021   Acute on chronic diastolic CHF (congestive heart failure) (HCC) 05/22/2021   Chronic diastolic CHF (congestive heart failure) (HCC) 05/22/2021   Protein-calorie malnutrition, severe 02/28/2021   Iron deficiency anemia 02/20/2021   Obsessive compulsive disorder    Cellulitis of sacral region 12/06/2020   Right pulmonary infiltrate on CXR 10/03/2020   Dysphagia 10/03/2020   GERD without esophagitis 10/03/2020   Aspiration pneumonia (HCC) 10/03/2020   Recurrent right pleural effusion 10/02/2020   Abnormal CT scan, colon    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   Nausea and vomiting 07/31/2020   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) 10/08/2019   Osteomyelitis (HCC) 10/08/2019   Acute osteomyelitis of right ankle or foot (HCC)    Ulcerated, foot, right, with necrosis of bone (HCC)    Pseudomonas aeruginosa infection 09/06/2019   MRSA infection 09/06/2019   Lactic acidosis    Stage 3 skin ulcer of sacral region (HCC) 08/10/2018   Gastric wall thickening 08/10/2018   Alkaline phosphatase elevation 08/10/2018    Cholelithiasis 08/10/2018   Microcytic anemia    Hiatal hernia 04/07/2018   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) 01/20/2016   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/15/2015   Cerebral palsy, quadriplegic (HCC)    Pre-diabetes 08/31/2015   Foot ulcer, limited to breakdown of skin (HCC) 06/08/2015   Constipation 11/01/2014   Seasonal allergies 11/01/2014   Pressure ulcer of foot 03/14/2014   CAP (community acquired pneumonia) 03/13/2014   Neuropathy 01/13/2013   Decubitus ulcer of left hip, stage 3 (HCC) 01/08/2013   URINARY INCONTINENCE 02/09/2010   Intellectual disability 07/24/2006   Infantile cerebral palsy (HCC) 07/24/2006    Palliative Care Assessment & Plan   Patient Profile:    Assessment:  61 year old gentleman from a group home, history of chronic diastolic congestive heart failure, history of right foot amputation iron deficiency anemia anxiety bipolar disorder, OCD, cerebral palsy, chronic bronchitis constipation allergies, history of gastric ulcer history of GI bleed, GERD hiatal hernia dyslipidemia history of pressure ulcer of sacral area, history of Pseudomonas infection and recurrent urinary tract infections.  Patient has functional quadriplegia.  Brought in from his group home due to productive cough, work-up revealed community-acquired pneumonia with concern for possible aspiration as well as large bilateral pleural effusions.  Patient admitted to hospital medicine service, started on antibiotics, also underwent a right thoracenteses and a left thoracenteses. Ongoing issues this hospitalization include concern for recurrent aspiration, speech therapist have been following and they have recommended for only comfort feeds.   Recommendations/Plan: Pain: MAR reviewed.  He has used multiple boluses and currently reports pain.  Increase to morphine 4mg /hr.  Continue breakthrough dosing of 3 mg every 30 minutes as needed.  We will continue to follow closely and  titrate as needed to ensure his comfort.   Family is hopeful for transition to residential hospice for end-of-life care.  Preference for residential  hospice in [place].  Consult placed to transition of care team to help facilitate.   Code Status:    Code Status Orders  (From admission, onward)           Start     Ordered   05/22/21 1709  Do not attempt resuscitation (DNR)  Continuous       Question Answer Comment  In the event of cardiac or respiratory ARREST Do not call a code blue   In the event of cardiac or respiratory ARREST Do not perform Intubation, CPR, defibrillation or ACLS   In the event of cardiac or respiratory ARREST Use medication by any route, position, wound care, and other measures to relive pain and suffering. May use oxygen, suction and manual treatment of airway obstruction as needed for comfort.      05/22/21 1711           Code Status History     Date Active Date Inactive Code Status Order ID Comments User Context   05/22/2021 1604 05/22/2021 1711 Full Code [id number]  Bobette Mo, MD ED   02/21/2021 0648 03/03/2021 2347 Full Code [id number]  Lorretta Harp, MD ED   02/20/2021 1801 02/21/2021 0648 DNR [id number]  Lorretta Harp, MD ED   12/06/2020 2258 12/16/2020 0713 Full Code [id number]  Marlow Baars, MD Inpatient   10/03/2020 0055 10/06/2020 2346 DNR [id number]  Marinda Elk, MD ED   10/03/2020 0055 10/03/2020 0055 DNR [id number]  Marinda Elk, MD ED   09/05/2020 1039 09/07/2020 2311 DNR [id number]  Lorin Glass, MD Inpatient   09/05/2020 0258 09/05/2020 1038 Full Code [id number]  Lewie Chamber, MD ED   07/31/2020 1758 08/04/2020 0323 Full Code [id number]  Osvaldo Shipper, MD ED   10/08/2019 1815 10/09/2019 2042 Full Code [id number]  Margie Ege A, DO Inpatient   04/08/2019 1316 04/12/2019 2132 Full Code [id number]  Joana Reamer, DO ED   08/10/2018 1642 08/15/2018 1737 DNR [id number]  Hollice Espy, MD Inpatient   01/14/2016 0946 01/18/2016 2226 DNR  [id number]  Irean Hong, NP Inpatient   01/08/2016 1522 01/14/2016 0946 Full Code [id number]  Palma Holter, MD Inpatient   10/19/2015 2054 10/22/2015 1646 DNR [id number]  Yolande Jolly, MD Inpatient   12/14/2014 1236 12/15/2014 1710 Full Code [id number]  Nadara Mustard, MD Inpatient   10/16/2014 1036 10/20/2014 1813 Full Code [id number]  Nani Ravens, MD Inpatient   03/13/2014 0323 03/14/2014 2132 Full Code [id number]  Leona Singleton, MD Inpatient       Prognosis: Weeks, possibly < 2 weeks.  Rebel has multiple medical problems with possibility of sudden cardiac death from heart block and tachybradycardia syndrome.  Overall, he has continued to decline and intake has become minimal.  Iwan has told his family that he is tired and has been talking about dying.  He was admitted with aspiration pneumonia (that was treated this admission with antibiotics), but he also continues to aspirate at this time and I am sure this is worsening again.  He has significant pain and is on a continuous infusion of morphine.  This may contribute to sleepiness and increased intake, however, he has and restless without adequate analgesia.  Clear goal is to focus on comfort if he continues as he approaches end-of-life.    Discharge Planning: Residential hospice  Care plan was discussed with  IDT  Thank you for allowing the Palliative Medicine Team to assist in the care of this patient.  Junelle Hashemi Neale Burly,  MD  Please contact Palliative Medicine Team phone at [phone] for questions and concerns.

## 2021-06-10 ENCOUNTER — Inpatient Hospital Stay (HOSPITAL_COMMUNITY): Payer: Medicare Other

## 2021-06-10 DIAGNOSIS — K5903 Drug induced constipation: Secondary | ICD-10-CM

## 2021-06-10 DIAGNOSIS — R1310 Dysphagia, unspecified: Secondary | ICD-10-CM

## 2021-06-10 DIAGNOSIS — T402X5A Adverse effect of other opioids, initial encounter: Secondary | ICD-10-CM

## 2021-06-10 MED ORDER — DICYCLOMINE HCL 20 MG PO TABS
20.0000 mg | ORAL_TABLET | Freq: Three times a day (TID) | ORAL | Status: DC
  Administered 2021-06-10 – 2021-06-21 (×22): 20 mg via ORAL
  Filled 2021-06-10 (×47): qty 1

## 2021-06-10 MED ORDER — SIMETHICONE 40 MG/0.6ML PO SUSP
80.0000 mg | Freq: Four times a day (QID) | ORAL | Status: DC | PRN
  Administered 2021-06-10 – 2021-06-17 (×3): 80 mg via ORAL
  Filled 2021-06-10 (×5): qty 1.2

## 2021-06-10 NOTE — Progress Notes (Signed)
PROGRESS NOTE  [name]:[id number] DOB: April 09, 1961 DOA: 05/22/2021 PCP: Audley Hose, MD  Brief History   [name] is a 61 y.o. male with medical history significant of chronic diastolic CHF, right foot amputee, iron deficiency anemia, anxiety, bipolar disorder, intellectual functional disability, obsessive-compulsive disorder, cerebral palsy, chronic bronchitis, constipation, environmental allergies, gastric ulcer with history of UGI bleed necessitating blood transfusion, GERD, hiatal hernia, history of varicella-zoster, hyperlipidemia, osteomyelitis of right foot, history of pneumonia, pressure ulcer of sacral area, history of Pseudomonas infection, recurrent UTIs, seborrheic dermatitis, vitamin D deficiency, functional quadriplegia who was brought from his group home due to productive cough for about a week.  Work up revealed community-acquired pneumonia with concern for possible aspiration, also revealed large B/L pleural effusions.  He was started on Rocephin and azithromycin empirically.  Post R thoracentesis by IR on 12/29 and post L thoracentesis by IR on 12/30.  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  He has had difficult to control pain.  After discussing with palliative and patient's sister, he has been transitioned to comfort measures.  Sister is at bedside. The patient is on a morphine drip. Palliative care is working with family regarding plans for comfort care. The family is hopeful that the patient can be transitioned to residential hospice for end of life care. They wish to have him accepted into [facility]. TOC has been consulted.  Scopalamine patch placed for upper airway secretions.  On 06/09/2020 the patient had complaints of abdominal distention. He had not had a BM in 2 days despite stool softeners and suppositories. Enema was attempted with poor  results. This morning the patient is again complaining of abdominal pain. Abdominal film demonstrates only gaseous distention. Bladder scan will also be performed to rule out a problem with the patient's foley for his discomfort. Simethicone and bentyl has been ordered. Consultants  Palliative care Interventional radiology Cardiology Wound care  Procedures  Thoracentesis  Antibiotics   Anti-infectives (From admission, onward)    Start     Dose/Rate Route Frequency Ordered Stop   06/03/21 2200  ceFAZolin (ANCEF) IVPB 2g/100 mL premix  Status:  Discontinued        2 g 200 mL/hr over 30 Minutes Intravenous Every 8 hours 06/03/21 2051 06/05/21 1641   05/24/21 1200  azithromycin (ZITHROMAX) tablet 500 mg        500 mg Oral Daily 05/24/21 1054 05/26/21 1047   05/23/21 1500  cefTRIAXone (ROCEPHIN) 1 g in sodium chloride 0.9 % 100 mL IVPB        1 g 200 mL/hr over 30 Minutes Intravenous Every 24 hours 05/22/21 1603 05/27/21 1620   05/23/21 1500  azithromycin (ZITHROMAX) 500 mg in sodium chloride 0.9 % 250 mL IVPB  Status:  Discontinued        500 mg 250 mL/hr over 60 Minutes Intravenous Every 24 hours 05/22/21 1603 05/24/21 1054   05/22/21 1530  cefTRIAXone (ROCEPHIN) 1 g in sodium chloride 0.9 % 100 mL IVPB        1 g 200 mL/hr over 30 Minutes Intravenous  Once 05/22/21 1519 05/22/21 1600   05/22/21 1530  azithromycin (ZITHROMAX) 500 mg in sodium chloride 0.9 % 250 mL IVPB        500 mg 250 mL/hr over 60 Minutes Intravenous  Once 05/22/21 1519 05/22/21 1740      Subjective  The patient is eating.  No new complaints. Family is at bedside. Objective   Vitals:  Vitals:   06/10/21 0336 06/10/21 1410  BP: 96/62 (!) 155/82  Pulse: 62 66  Resp: 14 20  Temp: 97.9 F (36.6 C) 98.4 F (36.9 C)  SpO2: 94% 99%   Exam:  Constitutional:  The patient is awake. He is complaining of abdominal discomfort and is requesting to be repositioned. Respiratory:  No increased work of  breathing. No wheezes, rales, or rhonchi No tactile fremitus Cardiovascular:  Regular rate and rhythm No murmurs, ectopy, or gallups. No lateral PMI. No thrills. Abdomen:  Abdomen is soft, diffusely tender and somewhat distended. No hernias, masses, or organomegaly Normoactive bowel sounds.  Musculoskeletal:  No cyanosis, clubbing, or edema Skin:  No rashes, lesions, ulcers palpation of skin: no induration or nodules Neurologic:  Unable to evaluate due to the patient's inability to cooperate with exam.  I have personally reviewed the following:   [place]    Cardiology Data    Other Data    Scheduled Meds:  chlorhexidine  15 mL Mouth Rinse BID   Chlorhexidine Gluconate Cloth  6 each Topical Daily   clotrimazole  1 application Topical BID   diclofenac Sodium  4 g Topical QID   hydrocortisone cream  1 application Topical BID   ketoconazole  1 application Topical Once per day on Mon Thu   lactulose  20 g Oral Daily   mouth rinse  15 mL Mouth Rinse BID   risperiDONE  2 mg Oral QHS   scopolamine  3 patch Transdermal Q72H    morphine 4 mg/hr (06/10/21 0353)    Principal Problem:   Acute dyspnea Active Problems:   Infantile cerebral palsy (HCC)   Pressure ulcer of foot   Constipation   Functional quadriplegia (HCC)   Stage 3 skin ulcer of sacral region (HCC)   Microcytic anemia   Lactic acidosis   Hx of right BKA (HCC)   Dysphagia   Aspiration pneumonia (HCC)   Iron deficiency anemia   Protein-calorie malnutrition, severe   Chronic diastolic CHF (congestive heart failure) (HCC)   Malnutrition of moderate degree   Portal vein thrombosis   Bilateral pleural effusion   Hyperammonemia (HCC)   Superior mesenteric vein thrombosis (HCC)   LOS: 18 days   A & P  Resolved acute dyspnea secondary to community-acquired pneumonia with concern for recurrent aspiration and large B/L pleural effusions, s/p  bilateral thoracentesis by IR. Speech therapist evaluated, recs reg consistency and thin fluids with aspiration precautions. Maintain oxygen saturation greater 92% No longer requiring oxygen supplementation post bilateral thoracentesis. Treat underlying conditions Completed 5 days of azithromycin Completed 5 days of Rocephin MRSA screening test positive Repeat imaging on 1/8 showed persistent pleural effusions Underwent repeat left thoracentesis 1/9 The patient is now comfort care.   Sacral Pressure wounds, present on admission Continue local wound care with wound care specialist guidance. Patient does have some erythema around his wound, no drainage appreciated.  Started on Ancef for possible infection, but not discontinued based on goals of care.  See below. Continue pain control. The patient is now comfort care.  Acute portal vein, superior mesenteric vein, splenic vein thrombosis -Patient was complaining of persistent abdominal pain -Found to have thrombosis in abdominal vessels -Started on IV heparin, now discontinued based on goals of care.  See below. The patient is now comfort care.  Abdominal pain/Constipation: On  06/09/2020 the patient had complaints of abdominal distention. He had not had a BM in 2 days despite stool softeners and suppositories. Enema was attempted with poor results. This morning the patient is again complaining of abdominal pain. Abdominal film demonstrates only gaseous distention. Bladder scan will also be performed to rule out a problem with the patient's foley for his discomfort. Simethicone and bentyl has been ordered.   Tachybradycardia syndrome suggesting underlying sinus node dysfunction -Noted on telemetry to have periods of atrial tachycardia as well as high degree AV block and possible complete heart block -He has been having periods of transient decrease in responsiveness which may be co-related -TSH checked and noted to be normal -Potassium and  magnesium also normal -VBG did not show significant hypercarbia. -Seen by cardiology who did not recommend pacemaker.  Avoiding AV nodal agents was recommended The patient is now comfort care.   Hyperammonemia -Ammonia level 90 -He was started on lactulose -Depakote dose was held, level was checked and noted to be subtherapeutic -He received a dose of L-carnitine -Follow-up level noted to be 40 The patient is now comfort care.   Chronic diastolic CHF Continue strict I's and O's and daily weight. The patient is now comfort care.   Infantile cerebral palsy Functional quadriplegia Muscle relaxers as needed Analgesics as needed The patient is now comfort care.   Iron deficiency anemia Hemoglobin has been stable Iron 19, ferritin 102. The patient is now comfort care.   Thrombocytosis -Likely reactive to underlying abdominal vein thrombosis The patient is now comfort care.   Severe protein calorie malnutrition Continue protein supplementation The patient is now comfort care.   Chronic constipation He has been getting lactulose, Senokot, smog enemas. Despite having multiple bowel movements, CT imaging from 1/9 shows moderate volume of stool in colon. The patient is now comfort care.   Chronic indwelling foley catheter Facility confirms that patient has a chronic foley catheter Continue Flomax. The patient is now comfort care.   Goals of care DNR Palliative care team consulted to assist with establishing goals of care Patient has had difficulty to manage pain -He is routinely complaining of significant abdominal pain as well as pain in his sacral wound and visibly appears uncomfortable -When he is given pain medication, he frequently becomes lethargic -With underlying cardiac issues including sinus node dysfunction, thrombosis of abdominal veins requiring anticoagulation, persistent sacral wound which appears to be nonhealing, hypoalbuminemia and poor nutritional status  functional status, his long-term prognosis appears to be very poor -I had an honest discussion with his legal guardian/sister Ms. [name] Fulcher and expressed my concerns regarding his overall quality of life/pain management and expected prognosis -I have recommended that she consider hospice for better Pain control and focus on his overall quality of life -[name] appears to be in agreement with this and would be interested in moving [place] to a residential hospice facility for end-of-life care -Discussed with Dr. Domingo Cocking who also met with [name] -Patient is started on morphine infusion for pain management The patient is now comfort care..  I have seen and examined this patient myself. I have spent 30 minutes in his evaluation and care.   DVT Prophylaxis: The patient is now comfort care. Code Status: DNR, comfort measures Family Communication: Updated patient's guardian/sister, Ms. Cinda Quest Disposition Plan: Referral has been made [facility], DO [facility] Direct contact: see [email or web address]  7PM-7AM contact night coverage as above 06/10/2021, 3:46 PM  LOS: 14 days

## 2021-06-10 NOTE — Progress Notes (Signed)
Daily Progress Note   Patient Name: [name]       Date: 06/10/2021 DOB: 04/28/1961  Age: 61 y.o. MRN#: [id number] Attending Physician: Karie Kirks, DO Primary Care Physician: Audley Hose, MD Admit Date: 05/22/2021  Reason for Consultation/Follow-up: Establishing goals of care  Subjective: I saw and examined [name] today.  He had multiple family members at the bedside visiting with him.  He was awake and interacted a little bit more with me.  Family at bedside (which did not include his sister who is his surrogate) had questions regarding his care plan and thought process behind transition to residential hospice.  We discussed essential nutrition and hydration and that [name] has stopped eating and drinking despite encouragement.  From my understanding, this continues to be the case.  In reviewing the chart, the only intake that he is documented to have had was 25 % of one meal on 1/10 and no other intake until he was documented to have eaten a few bites of lunch yesterday.  Discussed that if [name] were to rally and begin eating and drinking, I did not think that he would qualify for residential hospice, however, this has not been the case to this point in time per my review of his intake on a daily basis with staff and family.  Discussed that if he was unable to maintain his nutrition and hydration orally, the only other option would be placement of PEG tube for artificial nutrition and hydration and it was clear that this was something family did not think would be of benefit nor is it something [name] would want.  Length of Stay: 18  Current Medications: Scheduled Meds:   chlorhexidine  15 mL Mouth Rinse BID   Chlorhexidine Gluconate Cloth  6 each Topical Daily   clotrimazole   1 application Topical BID   diclofenac Sodium  4 g Topical QID   hydrocortisone cream  1 application Topical BID   ketoconazole  1 application Topical Once per day on Mon Thu   lactulose  20 g Oral Daily   mouth rinse  15 mL Mouth Rinse BID   risperiDONE  2 mg Oral QHS   scopolamine  3 patch Transdermal Q72H    Continuous Infusions:  morphine 4 mg/hr (06/10/21 0353)    PRN Meds: acetaminophen **OR**  acetaminophen, albuterol, alum & mag hydroxide-simeth, bisacodyl, bisacodyl, glycopyrrolate, guaiFENesin-dextromethorphan, haloperidol lactate, liver oil-zinc oxide, LORazepam, morphine, ondansetron **OR** ondansetron (ZOFRAN) IV  Physical Exam         Resting in bed No distress.  Opens eyes and answers a couple of simple questions.  He is more alert today. S 1 S 2  Regular work of breathing.  Secretions noted   Vital Signs: BP 96/62 (BP Location: Right Arm)    Pulse 62    Temp 97.9 F (36.6 C) (Oral)    Resp 14    Ht 6' (1.829 m)    Wt 103.4 kg    SpO2 94%    BMI 30.92 kg/m  SpO2: SpO2: 94 % O2 Device: O2 Device: Room Air O2 Flow Rate: O2 Flow Rate (L/min): 3 L/min  Intake/output summary:  Intake/Output Summary (Last 24 hours) at 06/10/2021 0850 Last data filed at 06/10/2021 [id number] Gross per 24 hour  Intake 345.33 ml  Output 3050 ml  Net -2704.67 ml    LBM: Last BM Date: 06/05/21 Baseline Weight: Weight: 59.8 kg Most recent weight: Weight: 103.4 kg       Palliative Assessment/Data:      Patient Active Problem List   Diagnosis Date Noted   Portal vein thrombosis 06/05/2021   Bilateral pleural effusion 06/05/2021   Hyperammonemia (HCC) 06/05/2021   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   Malnutrition of moderate degree 05/31/2021   Acute dyspnea 05/22/2021   Acute on chronic diastolic CHF (congestive heart failure) (HCC) 05/22/2021   Chronic diastolic CHF (congestive heart failure) ([place]) 05/22/2021   Protein-calorie malnutrition, severe 02/28/2021    Iron deficiency anemia 02/20/2021   Obsessive compulsive disorder    Cellulitis of sacral region 12/06/2020   Right pulmonary infiltrate on CXR 10/03/2020   Dysphagia 10/03/2020   GERD without esophagitis 10/03/2020   Aspiration pneumonia ([place]) 10/03/2020   Recurrent right pleural effusion 10/02/2020   Abnormal CT scan, colon    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   Nausea and vomiting 07/31/2020   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 ([place]) 10/08/2019   Osteomyelitis ([place]) 10/08/2019   Acute osteomyelitis of right ankle or foot (HCC)    Ulcerated, foot, right, with necrosis of bone (HCC)    Pseudomonas aeruginosa infection 09/06/2019   MRSA infection 09/06/2019   Lactic acidosis    Stage 3 skin ulcer of sacral region ([place]) 08/10/2018   Gastric wall thickening 08/10/2018   Alkaline phosphatase elevation 08/10/2018   Cholelithiasis 08/10/2018   Microcytic anemia    Hiatal hernia 04/07/2018   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) 01/20/2016   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/15/2015   Cerebral palsy, quadriplegic ([place])    Pre-diabetes 08/31/2015   Foot ulcer, limited to breakdown of skin ([place]) 06/08/2015   Constipation 11/01/2014   Seasonal allergies 11/01/2014   Pressure ulcer of foot 03/14/2014   CAP (community acquired pneumonia) 03/13/2014   Neuropathy 01/13/2013   Decubitus ulcer of left hip, stage 3 ([place]) 01/08/2013   URINARY INCONTINENCE 02/09/2010   Intellectual disability 07/24/2006   Infantile cerebral palsy ([place]) 07/24/2006    Palliative Care Assessment & Plan   Patient Profile:    Assessment:  61 year old gentleman from a group home, history of chronic diastolic congestive heart failure, history of right foot amputation iron deficiency anemia anxiety bipolar disorder, OCD, cerebral palsy, chronic bronchitis constipation allergies, history of gastric ulcer history of GI bleed, GERD hiatal hernia dyslipidemia history of pressure  ulcer of sacral  area, history of Pseudomonas infection and recurrent urinary tract infections.  Patient has functional quadriplegia.  Brought in from his group home due to productive cough, work-up revealed community-acquired pneumonia with concern for possible aspiration as well as large bilateral pleural effusions.  Patient admitted to hospital medicine service, started on antibiotics, also underwent a right thoracenteses and a left thoracenteses. Ongoing issues this hospitalization include concern for recurrent aspiration, speech therapist have been following and they have recommended for only comfort feeds.   Recommendations/Plan: Pain: MAR reviewed.  Continue morphine at 4 mg/h.  Continue breakthrough dosing of 3 mg every 30 minutes as needed.  We will continue to follow closely and titrate as needed to ensure his comfort.   Constipation, opioid related: Continue bowel regimen.  Addition of suppository as needed. Family is hopeful for transition to residential hospice for end-of-life care.  Preference for residential hospice in [place] placed to transition of care team to help facilitate.   Code Status:    Code Status Orders  (From admission, onward)           Start     Ordered   05/22/21 1709  Do not attempt resuscitation (DNR)  Continuous       Question Answer Comment  In the event of cardiac or respiratory ARREST Do not call a code blue   In the event of cardiac or respiratory ARREST Do not perform Intubation, CPR, defibrillation or ACLS   In the event of cardiac or respiratory ARREST Use medication by any route, position, wound care, and other measures to relive pain and suffering. May use oxygen, suction and manual treatment of airway obstruction as needed for comfort.      05/22/21 1711           Code Status History     Date Active Date Inactive Code Status Order ID Comments User Context   05/22/2021 1604 05/22/2021 1711 Full Code [id number]  [name], MD ED    02/21/2021 0648 03/03/2021 2347 Full Code [id number]  Ivor Costa, MD ED   02/20/2021 1801 02/21/2021 0648 DNR [id number]  Ivor Costa, MD ED   12/06/2020 2258 12/16/2020 0713 Full Code [id number]  Tacey Ruiz, MD Inpatient   10/03/2020 0055 10/06/2020 2346 DNR [id number]  Vernelle Emerald, MD ED   10/03/2020 0055 10/03/2020 0055 DNR [id number]  Vernelle Emerald, MD ED   09/05/2020 1039 09/07/2020 2311 DNR [id number]  Terrilee Croak, MD Inpatient   09/05/2020 0258 09/05/2020 1038 Full Code [id number]  Dwyane Dee, MD ED   07/31/2020 1758 08/04/2020 0323 Full Code [id number]  Bonnielee Haff, MD ED   10/08/2019 1815 10/09/2019 2042 Full Code [id number]  Cherylann Ratel A, DO Inpatient   04/08/2019 1316 04/12/2019 2132 Full Code [id number]  [name], DO ED   08/10/2018 1642 08/15/2018 1737 DNR [id number]  Annita Brod, MD Inpatient   01/14/2016 0946 01/18/2016 2226 DNR [id number]  Dory Horn, NP Inpatient   01/08/2016 1522 01/14/2016 0946 Full Code [id number]  Smiley Houseman, MD Inpatient   10/19/2015 2054 10/22/2015 1646 DNR [id number]  Aquilla Hacker, MD Inpatient   12/14/2014 1236 12/15/2014 1710 Full Code [id number]  Newt Minion, MD Inpatient   10/16/2014 1036 10/20/2014 1813 Full Code [id number]  Leone Brand, MD Inpatient   03/13/2014 0323 03/14/2014 2132 Full Code [id number]  Hilton Sinclair, MD Inpatient       Prognosis: Weeks, possibly < 2 weeks.  Coralyn Mark  has multiple medical problems with possibility of sudden cardiac death from heart block and tachybradycardia syndrome.  Overall, he has continued to decline and intake has become minimal.  Alhassane has told his family that he is tired and has been talking about dying.  He was admitted with aspiration pneumonia (that was treated this admission with antibiotics), but he also continues to aspirate at this time and I am sure this is worsening again.  He has significant pain and is on a continuous infusion of morphine.  This may contribute to  sleepiness and increased intake, however, he has and restless without adequate analgesia.  Clear goal is to focus on comfort if he continues as he approaches end-of-life.    Discharge Planning: Residential hospice  Care plan was discussed with  IDT  Thank you for allowing the Palliative Medicine Team to assist in the care of this patient.  Micheline Rough, MD  Please contact Palliative Medicine Team phone at [phone] for questions and concerns.

## 2021-06-11 MED ORDER — LORAZEPAM 2 MG/ML IJ SOLN
1.0000 mg | INTRAMUSCULAR | Status: DC
  Administered 2021-06-11 – 2021-06-21 (×23): 1 mg via INTRAVENOUS
  Filled 2021-06-11 (×22): qty 1

## 2021-06-11 MED ORDER — LORAZEPAM 2 MG/ML IJ SOLN
1.0000 mg | INTRAMUSCULAR | Status: DC | PRN
  Administered 2021-06-13: 1 mg via INTRAVENOUS
  Filled 2021-06-11 (×2): qty 1

## 2021-06-11 MED ORDER — MORPHINE BOLUS VIA INFUSION
4.0000 mg | INTRAVENOUS | Status: DC | PRN
  Administered 2021-06-13 – 2021-06-21 (×32): 4 mg via INTRAVENOUS
  Filled 2021-06-11: qty 4

## 2021-06-11 NOTE — Progress Notes (Addendum)
Daily Progress Note   Patient Name: [name]       Date: 06/11/2021 DOB: 08-08-1960  Age: 61 y.o. MRN#: [id number] Attending Physician: Karie Kirks, DO Primary Care Physician: Audley Hose, MD Admit Date: 05/22/2021  Reason for Consultation/Follow-up: Establishing goals of care  Subjective: I saw and examined [name] today.  His sister, [name], was at the bedside.  He complains of abdominal pain.  We have been working on constipation but no results after enema or suppository yesterday.  He tolerated each of these poorly.  Length of Stay: 19  Current Medications: Scheduled Meds:   chlorhexidine  15 mL Mouth Rinse BID   Chlorhexidine Gluconate Cloth  6 each Topical Daily   clotrimazole  1 application Topical BID   diclofenac Sodium  4 g Topical QID   dicyclomine  20 mg Oral TID AC & HS   hydrocortisone cream  1 application Topical BID   ketoconazole  1 application Topical Once per day on Mon Thu   lactulose  20 g Oral Daily   mouth rinse  15 mL Mouth Rinse BID   risperiDONE  2 mg Oral QHS   scopolamine  3 patch Transdermal Q72H    Continuous Infusions:  morphine 4 mg/hr (06/10/21 2352)    PRN Meds: acetaminophen **OR** acetaminophen, albuterol, alum & mag hydroxide-simeth, bisacodyl, bisacodyl, glycopyrrolate, guaiFENesin-dextromethorphan, haloperidol lactate, liver oil-zinc oxide, LORazepam, morphine, ondansetron **OR** ondansetron (ZOFRAN) IV, simethicone  Physical Exam         Resting in bed No distress.  Opens eyes and answers a couple of simple questions.  He is a little sleepier today than yesterday. S 1 S 2  Regular work of breathing.  Secretions noted   Vital Signs: BP (!) 120/56 (BP Location: Right Arm)    Pulse 78    Temp 98.6 F (37 C) (Axillary)     Resp 18    Ht 6' (1.829 m)    Wt 103.4 kg    SpO2 96%    BMI 30.92 kg/m  SpO2: SpO2: 96 % O2 Device: O2 Device: Room Air O2 Flow Rate: O2 Flow Rate (L/min): 3 L/min  Intake/output summary:  Intake/Output Summary (Last 24 hours) at 06/11/2021 0946 Last data filed at 06/11/2021 0543 Gross per 24 hour  Intake 526.06 ml  Output 1200 ml  Net -673.94 ml    LBM: Last BM Date: 06/10/21 Baseline Weight: Weight: 59.8 kg Most recent weight: Weight: 103.4 kg       Palliative Assessment/Data:      Patient Active Problem List   Diagnosis Date Noted   Portal vein thrombosis 06/05/2021   Bilateral pleural effusion 06/05/2021   Hyperammonemia ([place]) 06/05/2021   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   Malnutrition of moderate degree 05/31/2021   Acute dyspnea 05/22/2021   Acute on chronic diastolic CHF (congestive heart failure) ([place]) 05/22/2021   Chronic diastolic CHF (congestive heart failure) ([place]) 05/22/2021   Protein-calorie malnutrition, severe 02/28/2021   Iron deficiency anemia 02/20/2021   Obsessive compulsive disorder    Cellulitis of sacral region 12/06/2020   Right pulmonary infiltrate on CXR 10/03/2020   Dysphagia 10/03/2020   GERD without esophagitis 10/03/2020   Aspiration pneumonia ([place]) 10/03/2020   Recurrent right pleural effusion 10/02/2020   Abnormal CT scan, colon    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   Nausea and vomiting 07/31/2020   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 ([place]) 10/08/2019   Osteomyelitis ([place]) 10/08/2019   Acute osteomyelitis of right ankle or foot (HCC)    Ulcerated, foot, right, with necrosis of bone (HCC)    Pseudomonas aeruginosa infection 09/06/2019   MRSA infection 09/06/2019   Lactic acidosis    Stage 3 skin ulcer of sacral region ([place]) 08/10/2018   Gastric wall thickening 08/10/2018   Alkaline phosphatase elevation 08/10/2018   Cholelithiasis 08/10/2018   Microcytic anemia    Hiatal hernia 04/07/2018   Chronic deep  vein thrombosis (DVT) of femoral vein (HCC) 01/20/2016   Functional quadriplegia ([place]) 12/15/2015   Cerebral palsy, quadriplegic ([place])    Pre-diabetes 08/31/2015   Foot ulcer, limited to breakdown of skin ([place]) 06/08/2015   Constipation 11/01/2014   Seasonal allergies 11/01/2014   Pressure ulcer of foot 03/14/2014   CAP (community acquired pneumonia) 03/13/2014   Neuropathy 01/13/2013   Decubitus ulcer of left hip, stage 3 ([place]) 01/08/2013   URINARY INCONTINENCE 02/09/2010   Intellectual disability 07/24/2006   Infantile cerebral palsy ([place]) 07/24/2006    Palliative Care Assessment & Plan   Patient Profile:    Assessment:  61 year old gentleman from a group home, history of chronic diastolic congestive heart failure, history of right foot amputation iron deficiency anemia anxiety bipolar disorder, OCD, cerebral palsy, chronic bronchitis constipation allergies, history of gastric ulcer history of GI bleed, GERD hiatal hernia dyslipidemia history of pressure ulcer of sacral area, history of Pseudomonas infection and recurrent urinary tract infections.  Patient has functional quadriplegia.  Brought in from his group home due to productive cough, work-up revealed community-acquired pneumonia with concern for possible aspiration as well as large bilateral pleural effusions.  Patient admitted to hospital medicine service, started on antibiotics, also underwent a right thoracenteses and a left thoracenteses. Ongoing issues this hospitalization include concern for recurrent aspiration, speech therapist have been following and they have recommended for only comfort feeds.   Recommendations/Plan: Pain: MAR reviewed.  Continue morphine at 4 mg/h.  Continue breakthrough dosing of 3 mg every 30 minutes as needed.  We will continue to follow closely and titrate as needed to ensure his comfort.   Constipation, opioid related: Increasing abdominal pain.  Awaiting x-ray to ensure no signs of obstruction.   He has tolerated both suppository and enema poorly.  Consider Relistor based upon results of x-ray. Family is hopeful for transition to residential hospice for end-of-life  care.  Preference for residential hospice in [place] placed to transition of care team to help facilitate.   Code Status:    Code Status Orders  (From admission, onward)           Start     Ordered   05/22/21 1709  Do not attempt resuscitation (DNR)  Continuous       Question Answer Comment  In the event of cardiac or respiratory ARREST Do not call a code blue   In the event of cardiac or respiratory ARREST Do not perform Intubation, CPR, defibrillation or ACLS   In the event of cardiac or respiratory ARREST Use medication by any route, position, wound care, and other measures to relive pain and suffering. May use oxygen, suction and manual treatment of airway obstruction as needed for comfort.      05/22/21 1711           Code Status History     Date Active Date Inactive Code Status Order ID Comments User Context   05/22/2021 1604 05/22/2021 1711 Full Code [id number]  [name], MD ED   02/21/2021 0648 03/03/2021 2347 Full Code [id number]  Ivor Costa, MD ED   02/20/2021 1801 02/21/2021 0648 DNR [id number]  Ivor Costa, MD ED   12/06/2020 2258 12/16/2020 0713 Full Code [id number]  Tacey Ruiz, MD Inpatient   10/03/2020 0055 10/06/2020 2346 DNR [id number]  Vernelle Emerald, MD ED   10/03/2020 0055 10/03/2020 0055 DNR [id number]  Vernelle Emerald, MD ED   09/05/2020 1039 09/07/2020 2311 DNR [id number]  Terrilee Croak, MD Inpatient   09/05/2020 0258 09/05/2020 1038 Full Code [id number]  Dwyane Dee, MD ED   07/31/2020 1758 08/04/2020 0323 Full Code [id number]  Bonnielee Haff, MD ED   10/08/2019 1815 10/09/2019 2042 Full Code [id number]  Cherylann Ratel A, DO Inpatient   04/08/2019 1316 04/12/2019 2132 Full Code [id number]  [name], DO ED   08/10/2018 1642 08/15/2018 1737 DNR [id number]  Annita Brod, MD Inpatient   01/14/2016 0946 01/18/2016 2226 DNR [id number]  Dory Horn, NP Inpatient   01/08/2016 1522 01/14/2016 0946 Full Code [id number]  Smiley Houseman, MD Inpatient   10/19/2015 2054 10/22/2015 1646 DNR [id number]  Aquilla Hacker, MD Inpatient   12/14/2014 1236 12/15/2014 1710 Full Code [id number]  Newt Minion, MD Inpatient   10/16/2014 1036 10/20/2014 1813 Full Code [id number]  Leone Brand, MD Inpatient   03/13/2014 0323 03/14/2014 2132 Full Code [id number]  Hilton Sinclair, MD Inpatient       Prognosis: Weeks, possibly < 2 weeks.  [name] has multiple medical problems with possibility of sudden cardiac death from heart block and tachybradycardia syndrome.  Overall, he has continued to decline and intake has become minimal.  [name] has told his family that he is tired and has been talking about dying.  He has significant pain and is on a continuous infusion of morphine.  This may contribute to sleepiness and decreased intake, however, he was restless without adequate analgesia.  Clear goal is to focus on comfort if he continues as he approaches end-of-life.    Discharge Planning: Residential hospice  Care plan was discussed with  IDT  Thank you for allowing the Palliative Medicine Team to assist in the care of this patient.  Micheline Rough, MD  Please contact Palliative Medicine Team phone at [phone] for questions and concerns.

## 2021-06-11 NOTE — Progress Notes (Signed)
Daily Progress Note   Patient Name: [name]       Date: 06/11/2021 DOB: [birth year]  Age: 61 y.o. MRN#: [id number] Attending Physician: Karie Kirks, DO Primary Care Physician: Audley Hose, MD Admit Date: 05/22/2021  Reason for Consultation/Follow-up: Establishing goals of care  Subjective: I saw and examined [name] multiple times throughout the day today.  Dr. Benny Lennert and I discussed this morning concerned that he has appeared to be more distended.  We discussed plan to begin Ativan and try and decompress him but I received calls that his condition was worsening and he was crying in pain.  I met with family when his sister was in to visit.  We discussed my concern that there may be something else going on in his belly besides constipation (abdomen is very firm by this point) and she was clear that her only goal is making sure [name] is not uncomfortable.  Unfortunately he was restless and grimacing at that time.  We discussed plan to further increase pain medication as well as continuing scheduled Ativan.  I checked on him again prior to leaving for the evening and he did appear to be resting more comfortably.  Length of Stay: 19  Current Medications: Scheduled Meds:   chlorhexidine  15 mL Mouth Rinse BID   Chlorhexidine Gluconate Cloth  6 each Topical Daily   clotrimazole  1 application Topical BID   diclofenac Sodium  4 g Topical QID   dicyclomine  20 mg Oral TID AC & HS   hydrocortisone cream  1 application Topical BID   ketoconazole  1 application Topical Once per day on Mon Thu   lactulose  20 g Oral Daily   LORazepam  1 mg Intravenous Q4H   mouth rinse  15 mL Mouth Rinse BID   risperiDONE  2 mg Oral QHS   scopolamine  3 patch Transdermal Q72H    Continuous  Infusions:  morphine 7 mg/hr (06/11/21 1838)    PRN Meds: acetaminophen **OR** acetaminophen, albuterol, alum & mag hydroxide-simeth, bisacodyl, bisacodyl, glycopyrrolate, guaiFENesin-dextromethorphan, haloperidol lactate, liver oil-zinc oxide, LORazepam, morphine, ondansetron **OR** ondansetron (ZOFRAN) IV, simethicone  Physical Exam         Resting in bed Restless and groaning.  No meaningful talking with me today. S 1 S 2  Regular work of  breathing.  Secretions noted His abdomen is tight and distended.  He grimaces with any palpation.   Vital Signs: BP (!) 120/56 (BP Location: Right Arm)    Pulse 78    Temp 98.6 F (37 C) (Axillary)    Resp 18    Ht 6' (1.829 m)    Wt 103.4 kg    SpO2 96%    BMI 30.92 kg/m  SpO2: SpO2: 96 % O2 Device: O2 Device: Room Air O2 Flow Rate: O2 Flow Rate (L/min): 3 L/min  Intake/output summary:  Intake/Output Summary (Last 24 hours) at 06/11/2021 2126 Last data filed at 06/11/2021 0543 Gross per 24 hour  Intake 406.06 ml  Output 600 ml  Net -193.94 ml    LBM: Last BM Date: 06/10/21 Baseline Weight: Weight: 59.8 kg Most recent weight: Weight: 103.4 kg       Palliative Assessment/Data:      Patient Active Problem List   Diagnosis Date Noted   Portal vein thrombosis 06/05/2021   Bilateral pleural effusion 06/05/2021   Hyperammonemia (HCC) 06/05/2021   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   Malnutrition of moderate degree 05/31/2021   Acute dyspnea 05/22/2021   Acute on chronic diastolic CHF (congestive heart failure) (HCC) 05/22/2021   Chronic diastolic CHF (congestive heart failure) ([place]) 05/22/2021   Protein-calorie malnutrition, severe 02/28/2021   Iron deficiency anemia 02/20/2021   Obsessive compulsive disorder    Cellulitis of sacral region 12/06/2020   Right pulmonary infiltrate on CXR 10/03/2020   Dysphagia 10/03/2020   GERD without esophagitis 10/03/2020   Aspiration pneumonia ([place]) 10/03/2020   Recurrent  right pleural effusion 10/02/2020   Abnormal CT scan, colon    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   Nausea and vomiting 07/31/2020   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 ([place]) 10/08/2019   Osteomyelitis ([place]) 10/08/2019   Acute osteomyelitis of right ankle or foot (HCC)    Ulcerated, foot, right, with necrosis of bone (HCC)    Pseudomonas aeruginosa infection 09/06/2019   MRSA infection 09/06/2019   Lactic acidosis    Stage 3 skin ulcer of sacral region ([place]) 08/10/2018   Gastric wall thickening 08/10/2018   Alkaline phosphatase elevation 08/10/2018   Cholelithiasis 08/10/2018   Microcytic anemia    Hiatal hernia 04/07/2018   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) 01/20/2016   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/15/2015   Cerebral palsy, quadriplegic ([place])    Pre-diabetes 08/31/2015   Foot ulcer, limited to breakdown of skin ([place]) 06/08/2015   Constipation 11/01/2014   Seasonal allergies 11/01/2014   Pressure ulcer of foot 03/14/2014   CAP (community acquired pneumonia) 03/13/2014   Neuropathy 01/13/2013   Decubitus ulcer of left hip, stage 3 ([place]) 01/08/2013   URINARY INCONTINENCE 02/09/2010   Intellectual disability 07/24/2006   Infantile cerebral palsy ([place]) 07/24/2006    Palliative Care Assessment & Plan   Patient Profile:    Assessment:  61 year old [name] from a group home, history of chronic diastolic congestive heart failure, history of right foot amputation iron deficiency anemia anxiety bipolar disorder, OCD, cerebral palsy, chronic bronchitis constipation allergies, history of gastric ulcer history of GI bleed, GERD hiatal hernia dyslipidemia history of pressure ulcer of sacral area, history of Pseudomonas infection and recurrent urinary tract infections.  Patient has functional quadriplegia.  Brought in from his group home due to productive cough, work-up revealed community-acquired pneumonia with concern for possible aspiration as well as large  bilateral pleural effusions.  Patient admitted  to hospital medicine service, started on antibiotics, also underwent a right thoracenteses and a left thoracenteses. Ongoing issues this hospitalization include concern for recurrent aspiration, speech therapist have been following and they have recommended for only comfort feeds.   Recommendations/Plan: Pain: [name] was restless and moaning with what appeared to be abdominal pain that continued increasing significantly throughout the day today.  I am concerned he has developed an intra-abdominal process other than the constipation but I talked with his sister and really as the only goal is his comfort, no plan for further work-up or imaging.  Increased morphine infusion as well as bolus dosing.  Continue scheduled Ativan. Family is hopeful for transition to residential hospice for end-of-life care.  Preference for residential hospice in [place] placed to transition of care team to help facilitate.   Code Status:    Code Status Orders  (From admission, onward)           Start     Ordered   05/22/21 1709  Do not attempt resuscitation (DNR)  Continuous       Question Answer Comment  In the event of cardiac or respiratory ARREST Do not call a code blue   In the event of cardiac or respiratory ARREST Do not perform Intubation, CPR, defibrillation or ACLS   In the event of cardiac or respiratory ARREST Use medication by any route, position, wound care, and other measures to relive pain and suffering. May use oxygen, suction and manual treatment of airway obstruction as needed for comfort.      05/22/21 1711           Code Status History     Date Active Date Inactive Code Status Order ID Comments User Context   05/22/2021 1604 05/22/2021 1711 Full Code [id number]  [name], MD ED   02/21/2021 0648 03/03/2021 2347 Full Code [id number]  Ivor Costa, MD ED   02/20/2021 1801 02/21/2021 0648 DNR [id number]  Ivor Costa, MD ED    12/06/2020 2258 12/16/2020 0713 Full Code [id number]  Tacey Ruiz, MD Inpatient   10/03/2020 0055 10/06/2020 2346 DNR [id number]  Vernelle Emerald, MD ED   10/03/2020 0055 10/03/2020 0055 DNR [id number]  Vernelle Emerald, MD ED   09/05/2020 1039 09/07/2020 2311 DNR [id number]  Terrilee Croak, MD Inpatient   09/05/2020 0258 09/05/2020 1038 Full Code [id number]  Dwyane Dee, MD ED   07/31/2020 1758 08/04/2020 0323 Full Code [id number]  Bonnielee Haff, MD ED   10/08/2019 1815 10/09/2019 2042 Full Code [id number]  Cherylann Ratel A, DO Inpatient   04/08/2019 1316 04/12/2019 2132 Full Code [id number]  [name], DO ED   08/10/2018 1642 08/15/2018 1737 DNR [id number]  Annita Brod, MD Inpatient   01/14/2016 0946 01/18/2016 2226 DNR [id number]  Dory Horn, NP Inpatient   01/08/2016 1522 01/14/2016 0946 Full Code [id number]  Smiley Houseman, MD Inpatient   10/19/2015 2054 10/22/2015 1646 DNR [id number]  Aquilla Hacker, MD Inpatient   12/14/2014 1236 12/15/2014 1710 Full Code [id number]  Newt Minion, MD Inpatient   10/16/2014 1036 10/20/2014 1813 Full Code [id number]  Leone Brand, MD Inpatient   03/13/2014 0323 03/14/2014 2132 Full Code [id number]  Hilton Sinclair, MD Inpatient       Prognosis: [name] looks acutely worse to me today.  I am worried his prognosis may be limited to hours to days at this point.  Discharge Planning: Residential hospice  Care plan was discussed with  IDT  Thank you for allowing the Palliative Medicine Team to assist in the care of this patient.  Micheline Rough, MD  Please contact Palliative Medicine Team phone at [phone] for questions and concerns.

## 2021-06-11 NOTE — Progress Notes (Signed)
PROGRESS NOTE  [name] PYK:[id number] DOB: [birth year] DOA: 05/22/2021 PCP: Audley Hose, MD  Brief History   [name] is a 61 y.o. male with medical history significant of chronic diastolic CHF, right foot amputee, iron deficiency anemia, anxiety, bipolar disorder, intellectual functional disability, obsessive-compulsive disorder, cerebral palsy, chronic bronchitis, constipation, environmental allergies, gastric ulcer with history of UGI bleed necessitating blood transfusion, GERD, hiatal hernia, history of varicella-zoster, hyperlipidemia, osteomyelitis of right foot, history of pneumonia, pressure ulcer of sacral area, history of Pseudomonas infection, recurrent UTIs, seborrheic dermatitis, vitamin D deficiency, functional quadriplegia who was brought from his group home due to productive cough for about a week.  Work up revealed community-acquired pneumonia with concern for possible aspiration, also revealed large B/L pleural effusions.  He was started on Rocephin and azithromycin empirically.  Post R thoracentesis by IR on 12/29 and post L thoracentesis by IR on 12/30.  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  He has had difficult to control pain.  After discussing with palliative and patient's sister, he has been transitioned to comfort measures.  Sister is at bedside. The patient is on a morphine drip. Palliative care is working with family regarding plans for comfort care. The family is hopeful that the patient can be transitioned to residential hospice for end of life care. They wish to have him accepted into [facility]. TOC has been consulted.  Scopalamine patch placed for upper airway secretions.  On 06/09/2020 the patient had complaints of abdominal distention. He had not had a BM in 2 days despite stool softeners and suppositories. Enema was attempted with poor  results. This morning the patient is again complaining of abdominal pain. Abdominal film demonstrates only gaseous distention. Bladder scan will also be performed to rule out a problem with the patient's foley for his discomfort. Simethicone and bentyl has been ordered. However, today again the patient is experiencing abdominal pain related to gaseous distention of the bowel. I have discussed the patient with Dr. Domingo Cocking. I have ordered a rectal tube to attempt to decompress the bowel and I have made ativan scheduled to allow him to relax as recommended by Dr. Domingo Cocking. Consultants  Palliative care Interventional radiology Cardiology Wound care  Procedures  Thoracentesis  Antibiotics   Anti-infectives (From admission, onward)    Start     Dose/Rate Route Frequency Ordered Stop   06/03/21 2200  ceFAZolin (ANCEF) IVPB 2g/100 mL premix  Status:  Discontinued        2 g 200 mL/hr over 30 Minutes Intravenous Every 8 hours 06/03/21 2051 06/05/21 1641   05/24/21 1200  azithromycin (ZITHROMAX) tablet 500 mg        500 mg Oral Daily 05/24/21 1054 05/26/21 1047   05/23/21 1500  cefTRIAXone (ROCEPHIN) 1 g in sodium chloride 0.9 % 100 mL IVPB        1 g 200 mL/hr over 30 Minutes Intravenous Every 24 hours 05/22/21 1603 05/27/21 1620   05/23/21 1500  azithromycin (ZITHROMAX) 500 mg in sodium chloride 0.9 % 250 mL IVPB  Status:  Discontinued        500 mg 250 mL/hr over 60 Minutes Intravenous Every 24 hours 05/22/21 1603 05/24/21 1054   05/22/21 1530  cefTRIAXone (ROCEPHIN) 1 g in sodium chloride 0.9 % 100 mL IVPB        1 g 200 mL/hr over 30 Minutes Intravenous  Once 05/22/21 1519  05/22/21 1600   05/22/21 1530  azithromycin (ZITHROMAX) 500 mg in sodium chloride 0.9 % 250 mL IVPB        500 mg 250 mL/hr over 60 Minutes Intravenous  Once 05/22/21 1519 05/22/21 1740      Subjective  The patient is eating. No new complaints. Family is at bedside. Objective   Vitals:  Vitals:   06/10/21 1410  06/10/21 2130  BP: (!) 155/82 (!) 120/56  Pulse: 66 78  Resp: 20 18  Temp: 98.4 F (36.9 C) 98.6 F (37 C)  SpO2: 99% 96%   Exam:  Constitutional:  The patient is awake. He is in distress from abdominal pain. Respiratory:  No increased work of breathing. No wheezes, rales, or rhonchi No tactile fremitus Cardiovascular:  Regular rate and rhythm No murmurs, ectopy, or gallups. No lateral PMI. No thrills. Abdomen:  Abdomen is tensely distended.  No hernias, masses, or organomegaly Normoactive bowel sounds.  Musculoskeletal:  No cyanosis, clubbing, or edema Skin:  No rashes, lesions, ulcers palpation of skin: no induration or nodules Neurologic:  Unable to evaluate due to the patient's inability to cooperate with exam.  I have personally reviewed the following:   Anna    Cardiology Data    Other Data    Scheduled Meds:  chlorhexidine  15 mL Mouth Rinse BID   Chlorhexidine Gluconate Cloth  6 each Topical Daily   clotrimazole  1 application Topical BID   diclofenac Sodium  4 g Topical QID   dicyclomine  20 mg Oral TID AC & HS   hydrocortisone cream  1 application Topical BID   ketoconazole  1 application Topical Once per day on Mon Thu   lactulose  20 g Oral Daily   LORazepam  1 mg Intravenous Q4H   mouth rinse  15 mL Mouth Rinse BID   risperiDONE  2 mg Oral QHS   scopolamine  3 patch Transdermal Q72H    morphine 5 mg/hr (06/11/21 1533)    Principal Problem:   Acute dyspnea Active Problems:   Infantile cerebral palsy (HCC)   Pressure ulcer of foot   Constipation   Functional quadriplegia (HCC)   Stage 3 skin ulcer of sacral region (HCC)   Microcytic anemia   Lactic acidosis   Hx of right BKA (HCC)   Dysphagia   Aspiration pneumonia (HCC)   Iron deficiency anemia   Protein-calorie malnutrition, severe   Chronic diastolic CHF (congestive heart failure) (HCC)   Malnutrition of moderate  degree   Portal vein thrombosis   Bilateral pleural effusion   Hyperammonemia (HCC)   Superior mesenteric vein thrombosis (HCC)   LOS: 19 days   A & P  Resolved acute dyspnea secondary to community-acquired pneumonia with concern for recurrent aspiration and large B/L pleural effusions, s/p bilateral thoracentesis by IR. Speech therapist evaluated, recs reg consistency and thin fluids with aspiration precautions. Maintain oxygen saturation greater 92% No longer requiring oxygen supplementation post bilateral thoracentesis. Treat underlying conditions Completed 5 days of azithromycin Completed 5 days of Rocephin MRSA screening test positive Repeat imaging on 1/8 showed persistent pleural effusions Underwent repeat left thoracentesis 1/9 The patient is now comfort care.   Sacral Pressure wounds, present on admission Continue local wound care with wound care specialist guidance. Patient does have some erythema around his wound, no drainage appreciated.  Started on Ancef for possible infection, but not discontinued based on  goals of care.  See below. Continue pain control. The patient is now comfort care.  Acute portal vein, superior mesenteric vein, splenic vein thrombosis -Patient was complaining of persistent abdominal pain -Found to have thrombosis in abdominal vessels -Started on IV heparin, now discontinued based on goals of care.  See below. The patient is now comfort care.  Abdominal pain/Constipation: On 06/09/2020 the patient had complaints of abdominal distention. He had not had a BM in 2 days despite stool softeners and suppositories. Enema was attempted with poor results. This morning the patient is again complaining of abdominal pain. Abdominal film demonstrates only gaseous distention. Bladder scan will also be performed to rule out a problem with the patient's foley for his discomfort. Simethicone and bentyl was ordered.  However, today again the patient is experiencing  abdominal pain related to gaseous distention of the bowel. I have discussed the patient with Dr. Domingo Cocking. I have ordered a rectal tube to attempt to decompress the bowel and I have made ativan scheduled to allow him to relax as recommended by Dr. Domingo Cocking.   Tachybradycardia syndrome suggesting underlying sinus node dysfunction -Noted on telemetry to have periods of atrial tachycardia as well as high degree AV block and possible complete heart block -He has been having periods of transient decrease in responsiveness which may be co-related -TSH checked and noted to be normal -Potassium and magnesium also normal -VBG did not show significant hypercarbia. -Seen by cardiology who did not recommend pacemaker.  Avoiding AV nodal agents was recommended The patient is now comfort care.   Hyperammonemia -Ammonia level 90 -He was started on lactulose -Depakote dose was held, level was checked and noted to be subtherapeutic -He received a dose of L-carnitine -Follow-up level noted to be 40 The patient is now comfort care.   Chronic diastolic CHF Continue strict I's and O's and daily weight. The patient is now comfort care.   Infantile cerebral palsy Functional quadriplegia Muscle relaxers as needed Analgesics as needed The patient is now comfort care.   Iron deficiency anemia Hemoglobin has been stable Iron 19, ferritin 102. The patient is now comfort care.   Thrombocytosis -Likely reactive to underlying abdominal vein thrombosis The patient is now comfort care.   Severe protein calorie malnutrition Continue protein supplementation The patient is now comfort care.   Chronic constipation He has been getting lactulose, Senokot, smog enemas. Despite having multiple bowel movements, CT imaging from 1/9 shows moderate volume of stool in colon. The patient is now comfort care.   Chronic indwelling foley catheter Facility confirms that patient has a chronic foley catheter Continue  Flomax. The patient is now comfort care.   Goals of care DNR Palliative care team consulted to assist with establishing goals of care Patient has had difficulty to manage pain -He is routinely complaining of significant abdominal pain as well as pain in his sacral wound and visibly appears uncomfortable -When he is given pain medication, he frequently becomes lethargic -With underlying cardiac issues including sinus node dysfunction, thrombosis of abdominal veins requiring anticoagulation, persistent sacral wound which appears to be nonhealing, hypoalbuminemia and poor nutritional status functional status, his long-term prognosis appears to be very poor -I had an honest discussion with his legal guardian/sister Ms. [name] Fulcher and expressed my concerns regarding his overall quality of life/pain management and expected prognosis -I have recommended that she consider hospice for better Pain control and focus on his overall quality of life -[name] appears to be in agreement with this and  would be interested in moving [place] to a residential hospice facility for end-of-life care -Discussed with Dr. Domingo Cocking who also met with [name] -Patient is started on morphine infusion for pain management The patient is now comfort care..  I have seen and examined this patient myself. I have spent 34 minutes in his evaluation and care.   DVT Prophylaxis: The patient is now comfort care. Code Status: DNR, comfort measures Family Communication: Updated patient's guardian/sister, Ms. Cinda Quest Disposition Plan: Referral has been made [facility], DO [facility] Direct contact: see [email or web address]  7PM-7AM contact night coverage as above 06/11/2021, 5:56 PM  LOS: 14 days

## 2021-06-12 NOTE — Progress Notes (Signed)
Daily Progress Note   Patient Name: [name]       Date: 06/12/2021 DOB: Oct 01, 1960  Age: 61 y.o. MRN#: [id number] Attending Physician: Karie Kirks, DO Primary Care Physician: Audley Hose, MD Admit Date: 05/22/2021  Reason for Consultation/Follow-up: Establishing goals of care  Subjective: Patient appears comfortable, sister [name] at bedside.    Length of Stay: 20  Current Medications: Scheduled Meds:   chlorhexidine  15 mL Mouth Rinse BID   Chlorhexidine Gluconate Cloth  6 each Topical Daily   clotrimazole  1 application Topical BID   diclofenac Sodium  4 g Topical QID   dicyclomine  20 mg Oral TID AC & HS   hydrocortisone cream  1 application Topical BID   ketoconazole  1 application Topical Once per day on Mon Thu   lactulose  20 g Oral Daily   LORazepam  1 mg Intravenous Q4H   mouth rinse  15 mL Mouth Rinse BID   risperiDONE  2 mg Oral QHS   scopolamine  3 patch Transdermal Q72H    Continuous Infusions:  morphine 7 mg/hr (06/11/21 2248)    PRN Meds: acetaminophen **OR** acetaminophen, albuterol, alum & mag hydroxide-simeth, bisacodyl, bisacodyl, glycopyrrolate, guaiFENesin-dextromethorphan, haloperidol lactate, liver oil-zinc oxide, LORazepam, morphine, ondansetron **OR** ondansetron (ZOFRAN) IV, simethicone  Physical Exam         Resting in bed Moaning at times Not awake not alert S 1 S 2  Regular work of breathing.  No audible secretions noted His abdomen is tight and distended.     Vital Signs: BP (!) 120/56 (BP Location: Right Arm)    Pulse 78    Temp 98.6 F (37 C) (Axillary)    Resp 18    Ht 6' (1.829 m)    Wt 103.4 kg    SpO2 96%    BMI 30.92 kg/m  SpO2: SpO2: 96 % O2 Device: O2 Device: Room Air O2 Flow Rate: O2 Flow Rate (L/min): 3  L/min  Intake/output summary:  Intake/Output Summary (Last 24 hours) at 06/12/2021 1016 Last data filed at 06/12/2021 [id number] Gross per 24 hour  Intake --  Output 1150 ml  Net -1150 ml    LBM: Last BM Date: 06/10/21 Baseline Weight: Weight: 59.8 kg Most recent weight: Weight: 103.4 kg       Palliative  Assessment/Data:      Patient Active Problem List   Diagnosis Date Noted   Portal vein thrombosis 06/05/2021   Bilateral pleural effusion 06/05/2021   Hyperammonemia ([place]) 06/05/2021   Superior mesenteric vein thrombosis ([place]) 06/05/2021   Malnutrition of moderate degree 05/31/2021   Acute dyspnea 05/22/2021   Acute on chronic diastolic CHF (congestive heart failure) ([place]) 05/22/2021   Chronic diastolic CHF (congestive heart failure) ([place]) 05/22/2021   Protein-calorie malnutrition, severe 02/28/2021   Iron deficiency anemia 02/20/2021   Obsessive compulsive disorder    Cellulitis of sacral region 12/06/2020   Right pulmonary infiltrate on CXR 10/03/2020   Dysphagia 10/03/2020   GERD without esophagitis 10/03/2020   Aspiration pneumonia ([place]) 10/03/2020   Recurrent right pleural effusion 10/02/2020   Abnormal CT scan, colon    Hx of right BKA ([place]) 09/05/2020   Nausea and vomiting 07/31/2020   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 ([place]) 10/08/2019   Osteomyelitis ([place]) 10/08/2019   Acute osteomyelitis of right ankle or foot (HCC)    Ulcerated, foot, right, with necrosis of bone (HCC)    Pseudomonas aeruginosa infection 09/06/2019   MRSA infection 09/06/2019   Lactic acidosis    Stage 3 skin ulcer of sacral region ([place]) 08/10/2018   Gastric wall thickening 08/10/2018   Alkaline phosphatase elevation 08/10/2018   Cholelithiasis 08/10/2018   Microcytic anemia    Hiatal hernia 04/07/2018   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) 01/20/2016   Functional quadriplegia ([place]) 12/15/2015   Cerebral palsy, quadriplegic ([place])    Pre-diabetes 08/31/2015   Foot  ulcer, limited to breakdown of skin ([place]) 06/08/2015   Constipation 11/01/2014   Seasonal allergies 11/01/2014   Pressure ulcer of foot 03/14/2014   CAP (community acquired pneumonia) 03/13/2014   Neuropathy 01/13/2013   Decubitus ulcer of left hip, stage 3 ([place]) 01/08/2013   URINARY INCONTINENCE 02/09/2010   Intellectual disability 07/24/2006   Infantile cerebral palsy ([place]) 07/24/2006    Palliative Care Assessment & Plan   Patient Profile:    Assessment:  62 year old gentleman from a group home, history of chronic diastolic congestive heart failure, history of right foot amputation iron deficiency anemia anxiety bipolar disorder, OCD, cerebral palsy, chronic bronchitis constipation allergies, history of gastric ulcer history of GI bleed, GERD hiatal hernia dyslipidemia history of pressure ulcer of sacral area, history of Pseudomonas infection and recurrent urinary tract infections.  Patient has functional quadriplegia.  Brought in from his group home due to productive cough, work-up revealed community-acquired pneumonia with concern for possible aspiration as well as large bilateral pleural effusions.  Patient admitted to hospital medicine service, started on antibiotics, also underwent a right thoracenteses and a left thoracenteses. Ongoing issues this hospitalization include concern for recurrent aspiration, speech therapist have been following and they have recommended for only comfort feeds.   Recommendations/Plan: Pain: now at  Morphine 7 mg per hour infusion.  Continue comfort care Anticipate hospital death, search is on for residential hospice as well.  Continue current Ativan.  Discussed with sister about end of life signs and symptoms.     Code Status:    Code Status Orders  (From admission, onward)           Start     Ordered   05/22/21 1709  Do not attempt resuscitation (DNR)  Continuous       Question Answer Comment  In the event of cardiac or respiratory  ARREST Do not call a code blue  In the event of cardiac or respiratory ARREST Do not perform Intubation, CPR, defibrillation or ACLS   In the event of cardiac or respiratory ARREST Use medication by any route, position, wound care, and other measures to relive pain and suffering. May use oxygen, suction and manual treatment of airway obstruction as needed for comfort.      05/22/21 1711           Code Status History     Date Active Date Inactive Code Status Order ID Comments User Context   05/22/2021 1604 05/22/2021 1711 Full Code [id number]  [name], MD ED   02/21/2021 0648 03/03/2021 2347 Full Code [id number]  Ivor Costa, MD ED   02/20/2021 1801 02/21/2021 0648 DNR [id number]  Ivor Costa, MD ED   12/06/2020 2258 12/16/2020 0713 Full Code [id number]  Tacey Ruiz, MD Inpatient   10/03/2020 0055 10/06/2020 2346 DNR [id number]  Vernelle Emerald, MD ED   10/03/2020 0055 10/03/2020 0055 DNR [id number]  Vernelle Emerald, MD ED   09/05/2020 1039 09/07/2020 2311 DNR [id number]  Terrilee Croak, MD Inpatient   09/05/2020 0258 09/05/2020 1038 Full Code [id number]  Dwyane Dee, MD ED   07/31/2020 1758 08/04/2020 0323 Full Code [id number]  Bonnielee Haff, MD ED   10/08/2019 1815 10/09/2019 2042 Full Code [id number]  Cherylann Ratel A, DO Inpatient   04/08/2019 1316 04/12/2019 2132 Full Code [id number]  [name], DO ED   08/10/2018 1642 08/15/2018 1737 DNR [id number]  Annita Brod, MD Inpatient   01/14/2016 0946 01/18/2016 2226 DNR [id number]  Dory Horn, NP Inpatient   01/08/2016 1522 01/14/2016 0946 Full Code [id number]  Smiley Houseman, MD Inpatient   10/19/2015 2054 10/22/2015 1646 DNR [id number]  Aquilla Hacker, MD Inpatient   12/14/2014 1236 12/15/2014 1710 Full Code [id number]  Newt Minion, MD Inpatient   10/16/2014 1036 10/20/2014 1813 Full Code [id number]  Leone Brand, MD Inpatient   03/13/2014 0323 03/14/2014 2132 Full Code [id number]  Hilton Sinclair, MD Inpatient        Prognosis:  limited to hours to days   Discharge Planning: Residential hospice versus hospital death.   Care plan was discussed with  IDT  Thank you for allowing the Palliative Medicine Team to assist in the care of this patient.  Loistine Chance, MD  Please contact Palliative Medicine Team phone at [phone] for questions and concerns.

## 2021-06-12 NOTE — Progress Notes (Signed)
PROGRESS NOTE  [name] YEM:[id number] DOB: [birth year] DOA: 05/22/2021 PCP: Audley Hose, MD  Brief History   [name] is a 61 y.o. male with medical history significant of chronic diastolic CHF, right foot amputee, iron deficiency anemia, anxiety, bipolar disorder, intellectual functional disability, obsessive-compulsive disorder, cerebral palsy, chronic bronchitis, constipation, environmental allergies, gastric ulcer with history of UGI bleed necessitating blood transfusion, GERD, hiatal hernia, history of varicella-zoster, hyperlipidemia, osteomyelitis of right foot, history of pneumonia, pressure ulcer of sacral area, history of Pseudomonas infection, recurrent UTIs, seborrheic dermatitis, vitamin D deficiency, functional quadriplegia who was brought from his group home due to productive cough for about a week.  Work up revealed community-acquired pneumonia with concern for possible aspiration, also revealed large B/L pleural effusions.  He was started on Rocephin and azithromycin empirically.  Post R thoracentesis by IR on 12/29 and post L thoracentesis by IR on 12/30.  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  He has had difficult to control pain.  After discussing with palliative and patient's sister, he has been transitioned to comfort measures.  Sister is at bedside. The patient is on a morphine drip. Palliative care is working with family regarding plans for comfort care. The family is hopeful that the patient can be transitioned to residential hospice for end of life care. They wish to have him accepted into [facility] of [place]. TOC has been consulted.  Scopalamine patch placed for upper airway secretions.  On 06/09/2020 the patient had complaints of abdominal distention. He had not had a BM in 2 days despite stool softeners and suppositories. Enema was attempted with poor  results. This morning the patient is again complaining of abdominal pain. Abdominal film demonstrates only gaseous distention. Bladder scan will also be performed to rule out a problem with the patient's foley for his discomfort. Simethicone and bentyl has been ordered. However, today again the patient is experiencing abdominal pain related to gaseous distention of the bowel. I have discussed the patient with Dr. Domingo Cocking. I have ordered a rectal tube to attempt to decompress the bowel and I have made ativan scheduled to allow him to relax as recommended by Dr. Domingo Cocking.  Today the patient appears more comfortable, but he is not awake. Abdomen softer. Sister is at bedside. Although it appears that bed at [facility] may become available tomorrow, the sister is concerned that he may pass en route to the residential hospice. For that reason we are anticipating an inpatient death. Consultants  Palliative care Interventional radiology Cardiology Wound care  Procedures  Thoracentesis  Antibiotics   Anti-infectives (From admission, onward)    Start     Dose/Rate Route Frequency Ordered Stop   06/03/21 2200  ceFAZolin (ANCEF) IVPB 2g/100 mL premix  Status:  Discontinued        2 g 200 mL/hr over 30 Minutes Intravenous Every 8 hours 06/03/21 2051 06/05/21 1641   05/24/21 1200  azithromycin (ZITHROMAX) tablet 500 mg        500 mg Oral Daily 05/24/21 1054 05/26/21 1047   05/23/21 1500  cefTRIAXone (ROCEPHIN) 1 g in sodium chloride 0.9 % 100 mL IVPB        1 g 200 mL/hr over 30 Minutes Intravenous Every 24 hours 05/22/21 1603 05/27/21 1620   05/23/21 1500  azithromycin (ZITHROMAX) 500 mg in sodium chloride 0.9 % 250 mL IVPB  Status:  Discontinued  500 mg 250 mL/hr over 60 Minutes Intravenous Every 24 hours 05/22/21 1603 05/24/21 1054   05/22/21 1530  cefTRIAXone (ROCEPHIN) 1 g in sodium chloride 0.9 % 100 mL IVPB        1 g 200 mL/hr over 30 Minutes Intravenous  Once 05/22/21 1519 05/22/21 1600    05/22/21 1530  azithromycin (ZITHROMAX) 500 mg in sodium chloride 0.9 % 250 mL IVPB        500 mg 250 mL/hr over 60 Minutes Intravenous  Once 05/22/21 1519 05/22/21 1740      Subjective  The patient is eating. No new complaints. Family is at bedside. Objective   Vitals:  Vitals:   06/10/21 1410 06/10/21 2130  BP: (!) 155/82 (!) 120/56  Pulse: 66 78  Resp: 20 18  Temp: 98.4 F (36.9 C) 98.6 F (37 C)  SpO2: 99% 96%   Exam:  Constitutional:  The patient is awake. He is in distress from abdominal pain. Respiratory:  No increased work of breathing. No wheezes, rales, or rhonchi No tactile fremitus Cardiovascular:  Regular rate and rhythm No murmurs, ectopy, or gallups. No lateral PMI. No thrills. Abdomen:  Abdomen is tensely distended.  No hernias, masses, or organomegaly Normoactive bowel sounds.  Musculoskeletal:  No cyanosis, clubbing, or edema Skin:  No rashes, lesions, ulcers palpation of skin: no induration or nodules Neurologic:  Unable to evaluate due to the patient's inability to cooperate with exam.  I have personally reviewed the following:   [place]    Cardiology Data    Other Data    Scheduled Meds:  chlorhexidine  15 mL Mouth Rinse BID   Chlorhexidine Gluconate Cloth  6 each Topical Daily   clotrimazole  1 application Topical BID   diclofenac Sodium  4 g Topical QID   dicyclomine  20 mg Oral TID AC & HS   hydrocortisone cream  1 application Topical BID   ketoconazole  1 application Topical Once per day on Mon Thu   lactulose  20 g Oral Daily   LORazepam  1 mg Intravenous Q4H   mouth rinse  15 mL Mouth Rinse BID   risperiDONE  2 mg Oral QHS   scopolamine  3 patch Transdermal Q72H    morphine 5 mg/hr (06/12/21 1813)    Principal Problem:   Acute dyspnea Active Problems:   Infantile cerebral palsy (HCC)   Pressure ulcer of foot   Constipation   Functional quadriplegia  (HCC)   Stage 3 skin ulcer of sacral region (HCC)   Microcytic anemia   Lactic acidosis   Hx of right BKA (HCC)   Dysphagia   Aspiration pneumonia (HCC)   Iron deficiency anemia   Protein-calorie malnutrition, severe   Chronic diastolic CHF (congestive heart failure) (HCC)   Malnutrition of moderate degree   Portal vein thrombosis   Bilateral pleural effusion   Hyperammonemia (HCC)   Superior mesenteric vein thrombosis (HCC)   LOS: 20 days   A & P  Resolved acute dyspnea secondary to community-acquired pneumonia with concern for recurrent aspiration and large B/L pleural effusions, s/p bilateral thoracentesis by IR. Speech therapist evaluated, recs reg consistency and thin fluids with aspiration precautions. Maintain oxygen saturation greater 92% No longer requiring oxygen supplementation post bilateral thoracentesis. Treat underlying conditions Completed 5 days of azithromycin Completed 5 days of Rocephin MRSA screening test positive Repeat imaging on 1/8 showed persistent pleural effusions  Underwent repeat left thoracentesis 1/9 The patient is now comfort care. An inpatient death is anticipated.   Sacral Pressure wounds, present on admission Continue local wound care with wound care specialist guidance. Patient does have some erythema around his wound, no drainage appreciated.  Started on Ancef for possible infection, but not discontinued based on goals of care.  See below. Continue pain control. The patient is now comfort care. An inpatient death is anticipated.  Acute portal vein, superior mesenteric vein, splenic vein thrombosis -Patient was complaining of persistent abdominal pain -Found to have thrombosis in abdominal vessels -Started on IV heparin, now discontinued based on goals of care.  See below. The patient is now comfort care. An inpatient death is anticipated.  Abdominal pain/Constipation: On 06/09/2020 the patient had complaints of abdominal distention. He  had not had a BM in 2 days despite stool softeners and suppositories. Enema was attempted with poor results. This morning the patient is again complaining of abdominal pain. Abdominal film demonstrates only gaseous distention. Bladder scan will also be performed to rule out a problem with the patient's foley for his discomfort. Simethicone and bentyl was ordered.  However, today again the patient is experiencing abdominal pain related to gaseous distention of the bowel. I have discussed the patient with Dr. Domingo Cocking. I have ordered a rectal tube to attempt to decompress the bowel and I have made ativan scheduled to allow him to relax as recommended by Dr. Domingo Cocking. Abdomen is softer today. An inpatient death is anticipated.   Tachybradycardia syndrome suggesting underlying sinus node dysfunction -Noted on telemetry to have periods of atrial tachycardia as well as high degree AV block and possible complete heart block -He has been having periods of transient decrease in responsiveness which may be co-related -TSH checked and noted to be normal -Potassium and magnesium also normal -VBG did not show significant hypercarbia. -Seen by cardiology who did not recommend pacemaker.  Avoiding AV nodal agents was recommended The patient is now comfort care. An inpatient death is anticipated.   Hyperammonemia -Ammonia level 90 -He was started on lactulose -Depakote dose was held, level was checked and noted to be subtherapeutic -He received a dose of L-carnitine -Follow-up level noted to be 40 The patient is now comfort care. An inpatient death is anticipated.   Chronic diastolic CHF Continue strict I's and O's and daily weight. The patient is now comfort care. An inpatient death is anticipated.   Infantile cerebral palsy Functional quadriplegia Muscle relaxers as needed Analgesics as needed The patient is now comfort care.   Iron deficiency anemia Hemoglobin has been stable Iron 19, ferritin  102. The patient is now comfort care. An inpatient death is anticipated.   Thrombocytosis -Likely reactive to underlying abdominal vein thrombosis The patient is now comfort care. An inpatient death is anticipated.   Severe protein calorie malnutrition Continue protein supplementation The patient is now comfort care. An inpatient death is anticipated.   Chronic constipation He has been getting lactulose, Senokot, smog enemas. Despite having multiple bowel movements, CT imaging from 1/9 shows moderate volume of stool in colon. The patient is now comfort care. An inpatient death is anticipated.   Chronic indwelling foley catheter Facility confirms that patient has a chronic foley catheter Continue Flomax. The patient is now comfort care. An inpatient death is anticipated.   Goals of care DNR Palliative care team consulted to assist with establishing goals of care Patient has had difficulty to manage pain -He is routinely complaining of significant  abdominal pain as well as pain in his sacral wound and visibly appears uncomfortable -When he is given pain medication, he frequently becomes lethargic -With underlying cardiac issues including sinus node dysfunction, thrombosis of abdominal veins requiring anticoagulation, persistent sacral wound which appears to be nonhealing, hypoalbuminemia and poor nutritional status functional status, his long-term prognosis appears to be very poor -I had an honest discussion with his legal guardian/sister [name] and expressed my concerns regarding his overall quality of life/pain management and expected prognosis -I have recommended that she consider hospice for better Pain control and focus on his overall quality of life -[name] appears to be in agreement with this and would be interested in moving [place] to a residential hospice facility for end-of-life care -Discussed with Dr. Domingo Cocking who also met with [name] -Patient is started on morphine  infusion for pain management The patient is now comfort care. An inpatient death is anticipated.  I have seen and examined this patient myself. I have spent 34 minutes in his evaluation and care.   DVT Prophylaxis: The patient is now comfort care. Code Status: DNR, comfort measures Family Communication: Updated patient's guardian/sister, [name] Disposition Plan: Referral has been made [facility], DO [facility] Direct contact: see [email or web address]  7PM-7AM contact night coverage as above 06/12/2021, 6:38 PM  LOS: 14 days

## 2021-06-12 NOTE — TOC Progression Note (Addendum)
Transition of Care [facility]) - Progression Note    Patient Details  Name: [name] MRN: [id number] Date of Birth: Sep 11, 1960  Transition of Care [facility]) CM/SW Contact  Golda Acre, RN Phone Number: 06/12/2021, 8:47 AM  Clinical Narrative:    Pt with pain control issues/plan is for residental hospice at [facility] once stable. [facility] at [facility] hospice pt can come tomorrow once covid is back.  Dr. Radonna Ricker notified. Note:per Dr Linna Darner Patient's sister is requesting the patient stay in the hospital for end of life care, she is worried patient will pass away in the ambulance. I have seen the patient, likely not more than 24-48 hours prognosis in my opinion, what do you think.  Hospice notified to pull referral Expected Discharge Plan: Hospice Medical Facility Barriers to Discharge: No Barriers Identified  Expected Discharge Plan and Services Expected Discharge Plan: Hospice Medical Facility       Living arrangements for the past 2 months:  (Group home)                                       Social Determinants of Health (SDOH) Interventions    Readmission Risk Interventions Readmission Risk Prevention Plan 08/03/2020  Post Dischage Appt Complete  Medication Screening Complete  Some recent data might be hidden

## 2021-06-13 NOTE — Progress Notes (Signed)
PROGRESS NOTE    [name]  EAV:[id number] DOB: 11-02-1960 DOA: 05/22/2021 PCP: [place], MD    Brief Narrative:  [name] is a 61 year old male with past medical history significant for chronic diastolic congestive heart failure, iron deficiency anemia, anxiety, bipolar disorder, intellectual disability, obsessive-compulsive disorder, cerebral palsy, chronic bronchitis, constipation, history of upper GI bleed secondary to gastric ulcer, GERD, hiatal hernia, hyperlipidemia, osteomyelitis of right foot s/p amputation, sacral decubitus ulcer, recurrent UTIs, vitamin D deficiency, functional quadriplegia who presented to [facility] ED on 12/27 from his group home due to productive cough.  Patient reportedly has trouble sleeping due to the cough with decreased appetite.  In the ED, temperature 99.1 F, HR 106, RR 29, BP 131/73, SPO2 97% on room air.  WBC 12.4, hemoglobin 10.6, platelets 339.  High sensitive troponin within normal limits.  BNP 33.6.  Lactic acid 2.8.  Chest x-ray with cardiomegaly, central pulmonary prominence suggesting CHF, diffuse haziness of both lungs greatest on left with moderate to large bilateral pleural effusion.  Patient was started on azithromycin and ceftriaxone, and IV fluid bolus.  Hospitalist service consulted for further evaluation management.   Assessment & Plan:   Principal Problem:   Acute dyspnea Active Problems:   Infantile cerebral palsy (HCC)   Pressure ulcer of foot   Constipation   Functional quadriplegia (HCC)   Stage 3 skin ulcer of sacral region (HCC)   Microcytic anemia   Lactic acidosis   Hx of right BKA (HCC)   Dysphagia   Aspiration pneumonia (HCC)   Iron deficiency anemia   Protein-calorie malnutrition, severe   Chronic diastolic CHF (congestive heart failure) (HCC)   Malnutrition of moderate degree   Portal vein thrombosis   Bilateral pleural effusion   Hyperammonemia (HCC)   Superior mesenteric vein thrombosis  (HCC)   Community acquired pneumonia Patient initially presented to hospital with productive cough, elevated WBC count.  CT angiogram chest 12/27 with no pulmonary embolism. Completed 5-day course of azithromycin and ceftriaxone.  Bilateral pleural effusion Imaging notable for bilateral pleural effusion.  Patient underwent right-sided thoracentesis by IR on 05/24/2021 and left thoracentesis on 05/25/2021.  Repeat imaging on 06/03/2021 showed persistent pleural effusions and patient underwent left thoracentesis on 06/04/2021. --Now on comfort measures  Portal vein/superior mesenteric vein thrombosis Patient with worsening/persistent abdominal pain.  CT abdomen/pelvis with thrombosis of the main portal and superior mesenteric vein, likely acute with suspected thrombus in the splenic vein.  Was initially started on IV heparin, now discontinued given goals of care transition to comfort measures.  Abdominal pain/constipation: History of chronic constipation, abdominal plain films demonstrated gaseous distention.  Has Foley catheter in place for comfort.  Initially a rectal tube was placed to allow for decompression of the bowel but no appreciable improvements and now discontinued. 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  TSH within normal limits, potassium/magnesium normal.  Was evaluated by cardiology who did not recommend pacemaker and note avoid AV nodal blocking agents.  Patient now transition to comfort measures.  Hyperammonemia Ammonia level was noted be elevated at 90, was started on lactulose, and Depakote was held initially, but checking Depakote level was noted to be subtherapeutic.  Patient received a dose of L-carnitine and follow-up ammonia level improved to 40.  Now on comfort measures.  Chronic diastolic  congestive heart failure Likely  etiology of his bilateral pleural effusions.  Now on comfort measures.  Infantile cerebral palsy Functional quadriplegia --Continue muscle relaxants, analgesics, anxiolytics as needed  Obstructive uropathy, chronic --Continue Foley catheter  Goals of care Palliative care consulted and following during hospital course.  Given very poor long-term prognosis, meetings with patient's family with transition to comfort measures.  Given his significant decline, anticipate in-hospital demise.   DVT prophylaxis: Comfort care   Code Status: DNR Family Communication: Updated family present at bedside this morning  Disposition Plan:  Level of care: Med-Surg Status is: Inpatient  Remains inpatient appropriate because: On comfort measures, anticipate in the hospital death     Consultants:  Cardiology Palliative care Interventional radiology  Procedures:  Right thoracentesis 12/29 Left thoracentesis 12/30 Left thoracentesis 1/9  Antimicrobials:  Azithromycin 12/27 -12/31 Ceftriaxone 12/27 - 1/1 Cefazolin 1/8 - 1/10   Subjective: Patient seen examined at bedside, in mild distress due to abdominal pain.  Family present.  Family asking if morphine can be increased.  Continues with abdominal distention.  Actively dying.  Becoming less responsive and interactive per family.  On comfort measures, anticipate in-hospital death.  Objective: Vitals:   01-Jul-2021 1410 07/01/2021 2130 06/12/21 2148 06/13/21 1425  BP: (!) 155/82 (!) 120/56 (!) 101/56 111/63  Pulse: 66 78 67 65  Resp: 20 18 16 14   Temp: 98.4 F (36.9 C) 98.6 F (37 C) 98.4 F (36.9 C) 98.8 F (37.1 C)  TempSrc: Oral Axillary Oral Axillary  SpO2: 99% 96% 95% 93%  Weight:      Height:        Intake/Output Summary (Last 24 hours) at 06/13/2021 1828 Last data filed at 06/13/2021 1813 Gross per 24 hour  Intake 60 ml  Output 900 ml  Net -840 ml   Filed Weights   06/03/21 0500 06/03/21 0628  06/05/21 0500  Weight: 99.8 kg 99.8 kg 103.4 kg    Examination:  General exam: Awake, mild distress from abdominal pain Respiratory system: Clear to auscultation. Respiratory effort normal.  On room air Cardiovascular system: S1 & S2 heard, RRR. No JVD, murmurs, rubs, gallops or clicks. No pedal edema. Gastrointestinal system: Abdomen is distended, diffusely tender to palpation. No organomegaly or masses felt. Normal bowel sounds heard. Central nervous system: Awake.  Extremities: Extremities contracted Skin: Acral decubitus ulcer Psychiatry: Unable to fully assess due to his inability to cooperate with exam/history    Data Reviewed: I have personally reviewed following labs and imaging studies  CBC: No results for input(s): WBC, NEUTROABS, HGB, HCT, MCV, PLT in the last 168 hours. Basic Metabolic Panel: No results for input(s): NA, K, CL, CO2, GLUCOSE, BUN, CREATININE, CALCIUM, MG, PHOS in the last 168 hours. GFR: CrCl cannot be calculated (This lab value cannot be used to calculate CrCl because it is not a number: <0.30). Liver Function Tests: No results for input(s): AST, ALT, ALKPHOS, BILITOT, PROT, ALBUMIN in the last 168 hours. No results for input(s): LIPASE, AMYLASE in the last 168 hours. No results for input(s): AMMONIA in the last 168 hours. Coagulation Profile: No results for input(s): INR, PROTIME in the last 168 hours. Cardiac Enzymes: No results for input(s): CKTOTAL, CKMB, CKMBINDEX, TROPONINI in the last 168 hours. BNP (last 3 results) No results for input(s): PROBNP in the last 8760 hours. HbA1C: No results for input(s): HGBA1C in the last 72 hours. CBG: No results for input(s): GLUCAP in the last 168 hours. Lipid Profile: No results for input(s): CHOL, HDL, LDLCALC, TRIG, CHOLHDL, LDLDIRECT  in the last 72 hours. Thyroid Function Tests: No results for input(s): TSH, T4TOTAL, FREET4, T3FREE, THYROIDAB in the last 72 hours. Anemia Panel: No results for  input(s): VITAMINB12, FOLATE, FERRITIN, TIBC, IRON, RETICCTPCT in the last 72 hours. Sepsis Labs: No results for input(s): PROCALCITON, LATICACIDVEN in the last 168 hours.  No results found for this or any previous visit (from the past 240 hour(s)).       Radiology Studies: No results found.      Scheduled Meds:  chlorhexidine  15 mL Mouth Rinse BID   Chlorhexidine Gluconate Cloth  6 each Topical Daily   clotrimazole  1 application Topical BID   diclofenac Sodium  4 g Topical QID   dicyclomine  20 mg Oral TID AC & HS   hydrocortisone cream  1 application Topical BID   ketoconazole  1 application Topical Once per day on Mon Thu   lactulose  20 g Oral Daily   LORazepam  1 mg Intravenous Q4H   mouth rinse  15 mL Mouth Rinse BID   risperiDONE  2 mg Oral QHS   scopolamine  3 patch Transdermal Q72H   Continuous Infusions:  morphine 7 mg/hr (06/13/21 1028)     LOS: 21 days    Time spent: 39 minutes spent on chart review, discussion with nursing staff, consultants, updating family and interview/physical exam; more than 50% of that time was spent in counseling and/or coordination of care.    Alvira Philips [place], DO [facility] Available via Epic secure chat 7am-7pm After these hours, please refer to coverage provider listed on [email or web address] 06/13/2021, 6:28 PM

## 2021-06-13 NOTE — Progress Notes (Signed)
Daily Progress Note   Patient Name: [name]       Date: 06/13/2021 DOB: [birth year]  Age: 61 y.o. MRN#: [id number] Attending Physician: [place] [name] J, DO Primary Care Physician: Audley Hose, MD Admit Date: 05/22/2021  Reason for Consultation/Follow-up: Establishing goals of care  Subjective: Unresponsive, actively dying, no family at bedside at time of my visit, how ever discussed in detail on phone as well as in person with sister [name] on 06-12-21.      Length of Stay: 21  Current Medications: Scheduled Meds:   chlorhexidine  15 mL Mouth Rinse BID   Chlorhexidine Gluconate Cloth  6 each Topical Daily   clotrimazole  1 application Topical BID   diclofenac Sodium  4 g Topical QID   dicyclomine  20 mg Oral TID AC & HS   hydrocortisone cream  1 application Topical BID   ketoconazole  1 application Topical Once per day on Mon Thu   lactulose  20 g Oral Daily   LORazepam  1 mg Intravenous Q4H   mouth rinse  15 mL Mouth Rinse BID   risperiDONE  2 mg Oral QHS   scopolamine  3 patch Transdermal Q72H    Continuous Infusions:  morphine 7 mg/hr (06/13/21 1028)    PRN Meds: acetaminophen **OR** acetaminophen, albuterol, alum & mag hydroxide-simeth, bisacodyl, bisacodyl, glycopyrrolate, guaiFENesin-dextromethorphan, haloperidol lactate, liver oil-zinc oxide, LORazepam, morphine, ondansetron **OR** ondansetron (ZOFRAN) IV, simethicone  Physical Exam         Unresponsive Has contractures Resting in bed Moaning at times Not awake not alert S 1 S 2  Regular work of breathing.  No audible secretions noted His abdomen is tight and distended.     Vital Signs: BP 111/63 (BP Location: Right Arm)    Pulse 65    Temp 98.8 F (37.1 C) (Axillary)    Resp 14    Ht 6' (1.829 m)     Wt 103.4 kg    SpO2 93%    BMI 30.92 kg/m  SpO2: SpO2: 93 % O2 Device: O2 Device: Room Air O2 Flow Rate: O2 Flow Rate (L/min): 3 L/min  Intake/output summary:  Intake/Output Summary (Last 24 hours) at 06/13/2021 1658 Last data filed at 06/13/2021 1409 Gross per 24 hour  Intake 60 ml  Output 500 ml  Net -  440 ml    LBM: Last BM Date: 06/10/21 Baseline Weight: Weight: 59.8 kg Most recent weight: Weight: 103.4 kg       Palliative Assessment/Data:      Patient Active Problem List   Diagnosis Date Noted   Portal vein thrombosis 06/05/2021   Bilateral pleural effusion 06/05/2021   Hyperammonemia ([place]) 06/05/2021   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   Malnutrition of moderate degree 05/31/2021   Acute dyspnea 05/22/2021   Acute on chronic diastolic CHF (congestive heart failure) ([place]) 05/22/2021   Chronic diastolic CHF (congestive heart failure) ([place]) 05/22/2021   Protein-calorie malnutrition, severe 02/28/2021   Iron deficiency anemia 02/20/2021   Obsessive compulsive disorder    Cellulitis of sacral region 12/06/2020   Right pulmonary infiltrate on CXR 10/03/2020   Dysphagia 10/03/2020   GERD without esophagitis 10/03/2020   Aspiration pneumonia ([place]) 10/03/2020   Recurrent right pleural effusion 10/02/2020   Abnormal CT scan, colon    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   Nausea and vomiting 07/31/2020   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 ([place]) 10/08/2019   Osteomyelitis ([place]) 10/08/2019   Acute osteomyelitis of right ankle or foot (HCC)    Ulcerated, foot, right, with necrosis of bone (HCC)    Pseudomonas aeruginosa infection 09/06/2019   MRSA infection 09/06/2019   Lactic acidosis    Stage 3 skin ulcer of sacral region ([place]) 08/10/2018   Gastric wall thickening 08/10/2018   Alkaline phosphatase elevation 08/10/2018   Cholelithiasis 08/10/2018   Microcytic anemia    Hiatal hernia 04/07/2018   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  (HCC) 01/20/2016   Functional quadriplegia ([place]) 12/15/2015   Cerebral palsy, quadriplegic ([place])    Pre-diabetes 08/31/2015   Foot ulcer, limited to breakdown of skin ([place]) 06/08/2015   Constipation 11/01/2014   Seasonal allergies 11/01/2014   Pressure ulcer of foot 03/14/2014   CAP (community acquired pneumonia) 03/13/2014   Neuropathy 01/13/2013   Decubitus ulcer of left hip, stage 3 ([place]) 01/08/2013   URINARY INCONTINENCE 02/09/2010   Intellectual disability 07/24/2006   Infantile cerebral palsy ([place]) 07/24/2006    Palliative Care Assessment & Plan   Patient Profile:    Assessment:  61 year old gentleman from a group home, history of chronic diastolic congestive heart failure, history of right foot amputation iron deficiency anemia anxiety bipolar disorder, OCD, cerebral palsy, chronic bronchitis constipation allergies, history of gastric ulcer history of GI bleed, GERD hiatal hernia dyslipidemia history of pressure ulcer of sacral area, history of Pseudomonas infection and recurrent urinary tract infections.  Patient has functional quadriplegia.  Brought in from his group home due to productive cough, work-up revealed community-acquired pneumonia with concern for possible aspiration as well as large bilateral pleural effusions.  Patient admitted to hospital medicine service, started on antibiotics, also underwent a right thoracenteses and a left thoracenteses. Ongoing issues this hospitalization include concern for recurrent aspiration, speech therapist have been following and they have recommended for only comfort feeds.   Recommendations/Plan: Pain: continue Morphine   infusion.  Continue comfort care Anticipate hospital death,   Continue current Ativan.      Code Status:    Code Status Orders  (From admission, onward)           Start     Ordered   05/22/21 1709  Do not attempt resuscitation (DNR)  Continuous       Question Answer Comment  In the event of cardiac or  respiratory ARREST  Do not call a code blue   In the event of cardiac or respiratory ARREST Do not perform Intubation, CPR, defibrillation or ACLS   In the event of cardiac or respiratory ARREST Use medication by any route, position, wound care, and other measures to relive pain and suffering. May use oxygen, suction and manual treatment of airway obstruction as needed for comfort.      05/22/21 1711           Code Status History     Date Active Date Inactive Code Status Order ID Comments User Context   05/22/2021 1604 05/22/2021 1711 Full Code [id number]  [name], MD ED   02/21/2021 0648 03/03/2021 2347 Full Code [id number]  Ivor Costa, MD ED   02/20/2021 1801 02/21/2021 0648 DNR [id number]  Ivor Costa, MD ED   12/06/2020 2258 12/16/2020 0713 Full Code [id number]  Tacey Ruiz, MD Inpatient   10/03/2020 0055 10/06/2020 2346 DNR [id number]  Vernelle Emerald, MD ED   10/03/2020 0055 10/03/2020 0055 DNR [id number]  Vernelle Emerald, MD ED   09/05/2020 1039 09/07/2020 2311 DNR [id number]  Terrilee Croak, MD Inpatient   09/05/2020 0258 09/05/2020 1038 Full Code [id number]  Dwyane Dee, MD ED   07/31/2020 1758 08/04/2020 0323 Full Code [id number]  Bonnielee Haff, MD ED   10/08/2019 1815 10/09/2019 2042 Full Code [id number]  Cherylann Ratel A, DO Inpatient   04/08/2019 1316 04/12/2019 2132 Full Code [id number]  [name], DO ED   08/10/2018 1642 08/15/2018 1737 DNR [id number]  Annita Brod, MD Inpatient   01/14/2016 0946 01/18/2016 2226 DNR [id number]  Dory Horn, NP Inpatient   01/08/2016 1522 01/14/2016 0946 Full Code [id number]  Smiley Houseman, MD Inpatient   10/19/2015 2054 10/22/2015 1646 DNR [id number]  Aquilla Hacker, MD Inpatient   12/14/2014 1236 12/15/2014 1710 Full Code [id number]  Newt Minion, MD Inpatient   10/16/2014 1036 10/20/2014 1813 Full Code [id number]  Leone Brand, MD Inpatient   03/13/2014 0323 03/14/2014 2132 Full Code [id number]  Hilton Sinclair, MD  Inpatient       Prognosis:  limited to hours to days   Discharge Planning:   hospital death.   Care plan was discussed with  IDT  Thank you for allowing the Palliative Medicine Team to assist in the care of this patient.  Loistine Chance, MD  Please contact Palliative Medicine Team phone at [phone] for questions and concerns.

## 2021-06-14 DIAGNOSIS — Z515 Encounter for palliative care: Secondary | ICD-10-CM

## 2021-06-14 NOTE — Plan of Care (Signed)
  Problem: Nutrition: Goal: Adequate nutrition will be maintained Outcome: Progressing   Problem: Pain Managment: Goal: General experience of comfort will improve Outcome: Progressing   Problem: Safety: Goal: Ability to remain free from injury will improve Outcome: Progressing   

## 2021-06-14 NOTE — Progress Notes (Signed)
PROGRESS NOTE    HARLYN SOWA  JYN:[id number] DOB: [birth year] DOA: 05/22/2021 PCP: [place], MD    Brief Narrative:  [name] is a 61 year old male with past medical history significant for chronic diastolic congestive heart failure, iron deficiency anemia, anxiety, bipolar disorder, intellectual disability, obsessive-compulsive disorder, cerebral palsy, chronic bronchitis, constipation, history of upper GI bleed secondary to gastric ulcer, GERD, hiatal hernia, hyperlipidemia, osteomyelitis of right foot s/p amputation, sacral decubitus ulcer, recurrent UTIs, vitamin D deficiency, functional quadriplegia who presented to [facility] ED on 12/27 from his group home due to productive cough.  Patient reportedly has trouble sleeping due to the cough with decreased appetite.  In the ED, temperature 99.1 F, HR 106, RR 29, BP 131/73, SPO2 97% on room air.  WBC 12.4, hemoglobin 10.6, platelets 339.  High sensitive troponin within normal limits.  BNP 33.6.  Lactic acid 2.8.  Chest x-ray with cardiomegaly, central pulmonary prominence suggesting CHF, diffuse haziness of both lungs greatest on left with moderate to large bilateral pleural effusion.  Patient was started on azithromycin and ceftriaxone, and IV fluid bolus.  Hospitalist service consulted for further evaluation management.   Assessment & Plan:   Principal Problem:   Acute dyspnea Active Problems:   Infantile cerebral palsy (HCC)   Pressure ulcer of foot   Constipation   Functional quadriplegia (HCC)   Stage 3 skin ulcer of sacral region (HCC)   Microcytic anemia   Lactic acidosis   Hx of right BKA (HCC)   Dysphagia   Aspiration pneumonia (HCC)   Iron deficiency anemia   Protein-calorie malnutrition, severe   Chronic diastolic CHF (congestive heart failure) (HCC)   Malnutrition of moderate degree   Portal vein thrombosis   Bilateral pleural effusion   Hyperammonemia (HCC)   Superior mesenteric vein thrombosis  (HCC)   Community acquired pneumonia Patient initially presented to hospital with productive cough, elevated WBC count.  CT angiogram chest 12/27 with no pulmonary embolism. Completed 5-day course of azithromycin and ceftriaxone.  Bilateral pleural effusion Imaging notable for bilateral pleural effusion.  Patient underwent right-sided thoracentesis by IR on 05/24/2021 and left thoracentesis on 05/25/2021.  Repeat imaging on 06/03/2021 showed persistent pleural effusions and patient underwent left thoracentesis on 06/04/2021. --Now on comfort measures  Portal vein/superior mesenteric vein thrombosis Patient with worsening/persistent abdominal pain.  CT abdomen/pelvis with thrombosis of the main portal and superior mesenteric vein, likely acute with suspected thrombus in the splenic vein.  Was initially started on IV heparin, now discontinued given goals of care transition to comfort measures.  Abdominal pain/constipation: History of chronic constipation, abdominal plain films demonstrated gaseous distention.  Has Foley catheter in place for comfort.  Initially a rectal tube was placed to allow for decompression of the bowel but no appreciable improvements and now discontinued. 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  TSH within normal limits, potassium/magnesium normal.  Was evaluated by cardiology who did not recommend pacemaker and note avoid AV nodal blocking agents.  Patient now transition to comfort measures.  Hyperammonemia Ammonia level was noted be elevated at 90, was started on lactulose, and Depakote was held initially, but checking Depakote level was noted to be subtherapeutic.  Patient received a dose of L-carnitine and follow-up ammonia level improved to 40.  Now on comfort measures.  Chronic diastolic  congestive heart failure Likely  etiology of his bilateral pleural effusions.  Now on comfort measures.  Infantile cerebral palsy Functional quadriplegia --Continue muscle relaxants, analgesics, anxiolytics as needed  Obstructive uropathy, chronic --Continue Foley catheter  Goals of care Palliative care consulted and following during hospital course.  Given very poor long-term prognosis, meetings with patient's family with transition to comfort measures.  Given his significant decline, anticipate in-hospital demise.   DVT prophylaxis: Comfort care   Code Status: DNR Family Communication: Updated family present at bedside yesterday morning  Disposition Plan:  Level of care: Med-Surg Status is: Inpatient  Remains inpatient appropriate because: On comfort measures, anticipate in the hospital death     Consultants:  Cardiology Palliative care Interventional radiology  Procedures:  Right thoracentesis 12/29 Left thoracentesis 12/30 Left thoracentesis 1/9  Antimicrobials:  Azithromycin 12/27 -12/31 Ceftriaxone 12/27 - 1/1 Cefazolin 1/8 - 1/10   Subjective: Patient seen examined at bedside, does not appear to be in acute distress.  No family present.  On comfort measures, anticipate in-hospital death.  Objective: Vitals:   06/27/21 2148 06/13/21 1425 06/13/21 2251 06/14/21 0611  BP: (!) 101/56 111/63 108/74 (!) 92/42  Pulse: 67 65 63 (!) 55  Resp: 16 14 17  (!) 9  Temp: 98.4 F (36.9 C) 98.8 F (37.1 C) 98.6 F (37 C) 97.8 F (36.6 C)  TempSrc: Oral Axillary Oral Oral  SpO2: 95% 93% 96% 96%  Weight:      Height:        Intake/Output Summary (Last 24 hours) at 06/14/2021 1118 Last data filed at 06/14/2021 0900 Gross per 24 hour  Intake 580 ml  Output 800 ml  Net -220 ml   Filed Weights   06/03/21 0500 06/03/21 0628 06/05/21 0500  Weight: 99.8 kg 99.8 kg 103.4 kg    Examination:  General exam: Awake, NAD Respiratory system: Clear to auscultation.  Respiratory effort normal.  On room air Cardiovascular system: S1 & S2 heard, RRR. No JVD, murmurs, rubs, gallops or clicks. No pedal edema. Gastrointestinal system: Abdomen is distended, diffusely tender to palpation. No organomegaly or masses felt. Normal bowel sounds heard. Central nervous system: Awake.  Extremities: Extremities contracted Skin: Acral decubitus ulcer Psychiatry: Unable to fully assess due to his inability to cooperate with exam/history    Data Reviewed: I have personally reviewed following labs and imaging studies  CBC: No results for input(s): WBC, NEUTROABS, HGB, HCT, MCV, PLT in the last 168 hours. Basic Metabolic Panel: No results for input(s): NA, K, CL, CO2, GLUCOSE, BUN, CREATININE, CALCIUM, MG, PHOS in the last 168 hours. GFR: CrCl cannot be calculated (This lab value cannot be used to calculate CrCl because it is not a number: <0.30). Liver Function Tests: No results for input(s): AST, ALT, ALKPHOS, BILITOT, PROT, ALBUMIN in the last 168 hours. No results for input(s): LIPASE, AMYLASE in the last 168 hours. No results for input(s): AMMONIA in the last 168 hours. Coagulation Profile: No results for input(s): INR, PROTIME in the last 168 hours. Cardiac Enzymes: No results for input(s): CKTOTAL, CKMB, CKMBINDEX, TROPONINI in the last 168 hours. BNP (last 3 results) No results for input(s): PROBNP in the last 8760 hours. HbA1C: No results for input(s): HGBA1C in the last 72 hours. CBG: No results for input(s): GLUCAP in the last 168 hours. Lipid Profile: No results for input(s): CHOL, HDL, LDLCALC, TRIG, CHOLHDL, LDLDIRECT in the last 72 hours. Thyroid Function Tests: No results for input(s): TSH, T4TOTAL, FREET4, T3FREE, THYROIDAB in the last 72 hours. Anemia Panel: No  results for input(s): VITAMINB12, FOLATE, FERRITIN, TIBC, IRON, RETICCTPCT in the last 72 hours. Sepsis Labs: No results for input(s): PROCALCITON, LATICACIDVEN in the last 168  hours.  No results found for this or any previous visit (from the past 240 hour(s)).       Radiology Studies: No results found.      Scheduled Meds:  chlorhexidine  15 mL Mouth Rinse BID   Chlorhexidine Gluconate Cloth  6 each Topical Daily   clotrimazole  1 application Topical BID   diclofenac Sodium  4 g Topical QID   dicyclomine  20 mg Oral TID AC & HS   hydrocortisone cream  1 application Topical BID   ketoconazole  1 application Topical Once per day on Mon Thu   lactulose  20 g Oral Daily   LORazepam  1 mg Intravenous Q4H   mouth rinse  15 mL Mouth Rinse BID   risperiDONE  2 mg Oral QHS   scopolamine  3 patch Transdermal Q72H   Continuous Infusions:  morphine 7 mg/hr (06/14/21 0120)     LOS: 22 days    Time spent: 39 minutes spent on chart review, discussion with nursing staff, consultants, updating family and interview/physical exam; more than 50% of that time was spent in counseling and/or coordination of care.    Alvira Philips [place], DO [facility] Available via Epic secure chat 7am-7pm After these hours, please refer to coverage provider listed on [email or web address] 06/14/2021, 11:18 AM

## 2021-06-14 NOTE — Progress Notes (Signed)
Daily Progress Note   Patient Name: [name]       Date: 06/14/2021 DOB: [birth year]  Age: 61 y.o. MRN#: [id number] Attending Physician: [place] Eric J, DO Primary Care Physician: Audley Hose, MD Admit Date: 05/22/2021  Reason for Consultation/Follow-up: Establishing goals of care  Subjective: Responds some, with contractures, ongoing frailty and weakness, as per nursing staff, oral intake and mental status are being monitored.      Length of Stay: 22  Current Medications: Scheduled Meds:   chlorhexidine  15 mL Mouth Rinse BID   Chlorhexidine Gluconate Cloth  6 each Topical Daily   clotrimazole  1 application Topical BID   diclofenac Sodium  4 g Topical QID   dicyclomine  20 mg Oral TID AC & HS   hydrocortisone cream  1 application Topical BID   ketoconazole  1 application Topical Once per day on Mon Thu   lactulose  20 g Oral Daily   LORazepam  1 mg Intravenous Q4H   mouth rinse  15 mL Mouth Rinse BID   risperiDONE  2 mg Oral QHS   scopolamine  3 patch Transdermal Q72H    Continuous Infusions:  morphine 7 mg/hr (06/14/21 0120)    PRN Meds: acetaminophen **OR** acetaminophen, albuterol, alum & mag hydroxide-simeth, bisacodyl, bisacodyl, glycopyrrolate, guaiFENesin-dextromethorphan, haloperidol lactate, liver oil-zinc oxide, LORazepam, morphine, ondansetron **OR** ondansetron (ZOFRAN) IV, simethicone  Physical Exam           Has contractures Resting in bed  S 1 S 2  Regular work of breathing.  No audible secretions noted His abdomen is   distended.     Vital Signs: BP (!) 92/42 (BP Location: Right Arm)    Pulse (!) 55    Temp 97.8 F (36.6 C) (Oral)    Resp (!) 9    Ht 6' (1.829 m)    Wt 103.4 kg    SpO2 96%    BMI 30.92 kg/m  SpO2: SpO2: 96 % O2  Device: O2 Device: Room Air O2 Flow Rate: O2 Flow Rate (L/min): 3 L/min  Intake/output summary:  Intake/Output Summary (Last 24 hours) at 06/14/2021 1640 Last data filed at 06/14/2021 1400 Gross per 24 hour  Intake 820 ml  Output 1800 ml  Net -980 ml    LBM: Last BM Date: 06/12/21 Baseline  Weight: Weight: 59.8 kg Most recent weight: Weight: 103.4 kg       Palliative Assessment/Data:      Patient Active Problem List   Diagnosis Date Noted   Portal vein thrombosis 06/05/2021   Bilateral pleural effusion 06/05/2021   Hyperammonemia ([place]) 06/05/2021   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   Malnutrition of moderate degree 05/31/2021   Acute dyspnea 05/22/2021   Acute on chronic diastolic CHF (congestive heart failure) ([place]) 05/22/2021   Chronic diastolic CHF (congestive heart failure) ([place]) 05/22/2021   Protein-calorie malnutrition, severe 02/28/2021   Iron deficiency anemia 02/20/2021   Obsessive compulsive disorder    Cellulitis of sacral region 12/06/2020   Right pulmonary infiltrate on CXR 10/03/2020   Dysphagia 10/03/2020   GERD without esophagitis 10/03/2020   Aspiration pneumonia ([place]) 10/03/2020   Recurrent right pleural effusion 10/02/2020   Abnormal CT scan, colon    Hx of right BKA ([place]) 09/05/2020   Nausea and vomiting 07/31/2020   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 ([place]) 10/08/2019   Osteomyelitis ([place]) 10/08/2019   Acute osteomyelitis of right ankle or foot (HCC)    Ulcerated, foot, right, with necrosis of bone (HCC)    Pseudomonas aeruginosa infection 09/06/2019   MRSA infection 09/06/2019   Lactic acidosis    Stage 3 skin ulcer of sacral region ([place]) 08/10/2018   Gastric wall thickening 08/10/2018   Alkaline phosphatase elevation 08/10/2018   Cholelithiasis 08/10/2018   Microcytic anemia    Hiatal hernia 04/07/2018   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) 01/20/2016   Functional quadriplegia ([place]) 12/15/2015    Cerebral palsy, quadriplegic ([place])    Pre-diabetes 08/31/2015   Foot ulcer, limited to breakdown of skin ([place]) 06/08/2015   Constipation 11/01/2014   Seasonal allergies 11/01/2014   Pressure ulcer of foot 03/14/2014   CAP (community acquired pneumonia) 03/13/2014   Neuropathy 01/13/2013   Decubitus ulcer of left hip, stage 3 ([place]) 01/08/2013   URINARY INCONTINENCE 02/09/2010   Intellectual disability 07/24/2006   Infantile cerebral palsy ([place]) 07/24/2006    Palliative Care Assessment & Plan   Patient Profile:    Assessment:  61 year old gentleman from a group home, history of chronic diastolic congestive heart failure, history of right foot amputation iron deficiency anemia anxiety bipolar disorder, OCD, cerebral palsy, chronic bronchitis constipation allergies, history of gastric ulcer history of GI bleed, GERD hiatal hernia dyslipidemia history of pressure ulcer of sacral area, history of Pseudomonas infection and recurrent urinary tract infections.  Patient has functional quadriplegia.  Brought in from his group home due to productive cough, work-up revealed community-acquired pneumonia with concern for possible aspiration as well as large bilateral pleural effusions.  Patient admitted to hospital medicine service, started on antibiotics, also underwent a right thoracenteses and a left thoracenteses. Ongoing issues this hospitalization include concern for recurrent aspiration, speech therapist have been following and they have recommended for only comfort feeds.   Recommendations/Plan: Pain: continue Morphine   infusion.  Continue comfort care Anticipate hospital death,   Continue current Ativan.    Call placed and updated sister [name] HCPOA.   Code Status:    Code Status Orders  (From admission, onward)           Start     Ordered   05/22/21 1709  Do not attempt resuscitation (DNR)  Continuous       Question Answer Comment  In the event of cardiac or respiratory ARREST  Do not call a  code blue   In the event of cardiac or respiratory ARREST Do not perform Intubation, CPR, defibrillation or ACLS   In the event of cardiac or respiratory ARREST Use medication by any route, position, wound care, and other measures to relive pain and suffering. May use oxygen, suction and manual treatment of airway obstruction as needed for comfort.      05/22/21 1711           Code Status History     Date Active Date Inactive Code Status Order ID Comments User Context   05/22/2021 1604 05/22/2021 1711 Full Code [id number]  [name], MD ED   02/21/2021 0648 03/03/2021 2347 Full Code [id number]  Ivor Costa, MD ED   02/20/2021 1801 02/21/2021 0648 DNR [id number]  Ivor Costa, MD ED   12/06/2020 2258 12/16/2020 0713 Full Code [id number]  Tacey Ruiz, MD Inpatient   10/03/2020 0055 10/06/2020 2346 DNR [id number]  Vernelle Emerald, MD ED   10/03/2020 0055 10/03/2020 0055 DNR [id number]  Vernelle Emerald, MD ED   09/05/2020 1039 09/07/2020 2311 DNR [id number]  Terrilee Croak, MD Inpatient   09/05/2020 0258 09/05/2020 1038 Full Code [id number]  Dwyane Dee, MD ED   07/31/2020 1758 08/04/2020 0323 Full Code [id number]  Bonnielee Haff, MD ED   10/08/2019 1815 10/09/2019 2042 Full Code [id number]  Cherylann Ratel A, DO Inpatient   04/08/2019 1316 04/12/2019 2132 Full Code [id number]  [name], DO ED   08/10/2018 1642 08/15/2018 1737 DNR [id number]  Annita Brod, MD Inpatient   01/14/2016 0946 01/18/2016 2226 DNR [id number]  Dory Horn, NP Inpatient   01/08/2016 1522 01/14/2016 0946 Full Code [id number]  Smiley Houseman, MD Inpatient   10/19/2015 2054 10/22/2015 1646 DNR [id number]  Aquilla Hacker, MD Inpatient   12/14/2014 1236 12/15/2014 1710 Full Code [id number]  Newt Minion, MD Inpatient   10/16/2014 1036 10/20/2014 1813 Full Code [id number]  Leone Brand, MD Inpatient   03/13/2014 0323 03/14/2014 2132 Full Code [id number]  Hilton Sinclair, MD Inpatient        Prognosis:  limited to hours to days   Discharge Planning:   hospital death.   Care plan was discussed with  IDT  Thank you for allowing the Palliative Medicine Team to assist in the care of this patient.  Loistine Chance, MD  Please contact Palliative Medicine Team phone at [phone] for questions and concerns.

## 2021-06-15 NOTE — Progress Notes (Signed)
PROGRESS NOTE    [name]  ZOX:[id number] DOB: 27-Apr-1961 DOA: 05/22/2021 PCP: [place], MD    Brief Narrative:  REYAANSH [name] is a 61 year old male with past medical history significant for chronic diastolic congestive heart failure, iron deficiency anemia, anxiety, bipolar disorder, intellectual disability, obsessive-compulsive disorder, cerebral palsy, chronic bronchitis, constipation, history of upper GI bleed secondary to gastric ulcer, GERD, hiatal hernia, hyperlipidemia, osteomyelitis of right foot s/p amputation, sacral decubitus ulcer, recurrent UTIs, vitamin D deficiency, functional quadriplegia who presented to [facility] ED on 12/27 from his group home due to productive cough.  Patient reportedly has trouble sleeping due to the cough with decreased appetite.  In the ED, temperature 99.1 F, HR 106, RR 29, BP 131/73, SPO2 97% on room air.  WBC 12.4, hemoglobin 10.6, platelets 339.  High sensitive troponin within normal limits.  BNP 33.6.  Lactic acid 2.8.  Chest x-ray with cardiomegaly, central pulmonary prominence suggesting CHF, diffuse haziness of both lungs greatest on left with moderate to large bilateral pleural effusion.  Patient was started on azithromycin and ceftriaxone, and IV fluid bolus.  Hospitalist service consulted for further evaluation management.   Assessment & Plan:   Principal Problem:   Acute dyspnea Active Problems:   Infantile cerebral palsy (HCC)   Pressure ulcer of foot   Constipation   Functional quadriplegia (HCC)   Stage 3 skin ulcer of sacral region (HCC)   Microcytic anemia   Lactic acidosis   Hx of right BKA (HCC)   Dysphagia   Aspiration pneumonia (HCC)   Iron deficiency anemia   Protein-calorie malnutrition, severe   Chronic diastolic CHF (congestive heart failure) (HCC)   Malnutrition of moderate degree   Portal vein thrombosis   Bilateral pleural effusion   Hyperammonemia (HCC)   Superior mesenteric vein thrombosis (HCC)    Palliative care by specialist   Community acquired pneumonia Patient initially presented to hospital with productive cough, elevated WBC count.  CT angiogram chest 12/27 with no pulmonary embolism. Completed 5-day course of azithromycin and ceftriaxone.  Bilateral pleural effusion Imaging notable for bilateral pleural effusion.  Patient underwent right-sided thoracentesis by IR on 05/24/2021 and left thoracentesis on 05/25/2021.  Repeat imaging on 06/03/2021 showed persistent pleural effusions and patient underwent left thoracentesis on 06/04/2021. --Now on comfort measures  Portal vein/superior mesenteric vein thrombosis Patient with worsening/persistent abdominal pain.  CT abdomen/pelvis with thrombosis of the main portal and superior mesenteric vein, likely acute with suspected thrombus in the splenic vein.  Was initially started on IV heparin, now discontinued given goals of care transition to comfort measures.  Abdominal pain/constipation: History of chronic constipation, abdominal plain films demonstrated gaseous distention.  Has Foley catheter in place for comfort.  Initially a rectal tube was placed to allow for decompression of the bowel but no appreciable improvements and now discontinued. 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  TSH within normal limits, potassium/magnesium normal.  Was evaluated by cardiology who did not recommend pacemaker and note avoid AV nodal blocking agents.  Patient now transition to comfort measures.  Hyperammonemia Ammonia level was noted be elevated at 90, was started on lactulose, and Depakote was held initially, but checking Depakote level was noted to be subtherapeutic.  Patient received a dose of L-carnitine and follow-up ammonia level improved to 40.  Now on comfort  measures.  Chronic diastolic congestive heart failure Likely etiology of his bilateral pleural effusions.  Now on comfort measures.  Infantile cerebral palsy Functional quadriplegia --Continue muscle relaxants, analgesics, anxiolytics as needed  Obstructive uropathy, chronic --Continue Foley catheter  Goals of care Palliative care consulted and following during hospital course.  Given very poor long-term prognosis, meetings with patient's family with transition to comfort measures.  Given his significant decline, anticipate in-hospital demise.   DVT prophylaxis: Comfort care   Code Status: DNR Family Communication: Updated family present at bedside yesterday morning  Disposition Plan:  Level of care: Med-Surg Status is: Inpatient  Remains inpatient appropriate because: On comfort measures, anticipate in the hospital death     Consultants:  Cardiology Palliative care Interventional radiology  Procedures:  Right thoracentesis 12/29 Left thoracentesis 12/30 Left thoracentesis 1/9  Antimicrobials:  Azithromycin 12/27 -12/31 Ceftriaxone 12/27 - 1/1 Cefazolin 1/8 - 1/10   Subjective: Patient seen examined at bedside, does not appear to be in acute distress.  Patient's sister, [place] Muta present at bedside.  RN states had 2 meals yesterday, [name] reports he enjoyed his ice cream/milkshake.  Reports pain is adequately controlled on morphine infusion.  On comfort measures.  Objective: Vitals:   06/13/21 1425 06/13/21 2251 06/14/21 0611 06/14/21 2030  BP: 111/63 108/74 (!) 92/42 130/77  Pulse: 65 63 (!) 55 (!) 102  Resp: 14 17 (!) 9 16  Temp: 98.8 F (37.1 C) 98.6 F (37 C) 97.8 F (36.6 C) 98.3 F (36.8 C)  TempSrc: Axillary Oral Oral Oral  SpO2: 93% 96% 96% 97%  Weight:      Height:        Intake/Output Summary (Last 24 hours) at 06/15/2021 1026 Last data filed at 06/15/2021 0600 Gross per 24 hour  Intake 608.44 ml  Output 2100 ml  Net -1491.56 ml   Filed  Weights   06/03/21 0500 06/03/21 0628 06/05/21 0500  Weight: 99.8 kg 99.8 kg 103.4 kg    Examination:  General exam: Awake/alert, NAD Respiratory system: Clear to auscultation. Respiratory effort normal.  On room air Cardiovascular system: S1 & S2 heard, RRR. No JVD, murmurs, rubs, gallops or clicks. No pedal edema. Gastrointestinal system: Abdomen is distended, diffusely tender to palpation. No organomegaly or masses felt. Normal bowel sounds heard. Central nervous system: Awake.  Extremities: Extremities contracted Skin: sacral decubitus ulcer Psychiatry: Unable to fully assess due to his inability to cooperate with exam/history    Data Reviewed: I have personally reviewed following labs and imaging studies  CBC: No results for input(s): WBC, NEUTROABS, HGB, HCT, MCV, PLT in the last 168 hours. Basic Metabolic Panel: No results for input(s): NA, K, CL, CO2, GLUCOSE, BUN, CREATININE, CALCIUM, MG, PHOS in the last 168 hours. GFR: CrCl cannot be calculated (This lab value cannot be used to calculate CrCl because it is not a number: <0.30). Liver Function Tests: No results for input(s): AST, ALT, ALKPHOS, BILITOT, PROT, ALBUMIN in the last 168 hours. No results for input(s): LIPASE, AMYLASE in the last 168 hours. No results for input(s): AMMONIA in the last 168 hours. Coagulation Profile: No results for input(s): INR, PROTIME in the last 168 hours. Cardiac Enzymes: No results for input(s): CKTOTAL, CKMB, CKMBINDEX, TROPONINI in the last 168 hours. BNP (last 3 results) No results for input(s): PROBNP in the last 8760 hours. HbA1C: No results for input(s): HGBA1C in the last 72 hours. CBG: No results for input(s): GLUCAP in the last 168 hours. Lipid Profile: No results for input(s): CHOL, HDL,  LDLCALC, TRIG, CHOLHDL, LDLDIRECT in the last 72 hours. Thyroid Function Tests: No results for input(s): TSH, T4TOTAL, FREET4, T3FREE, THYROIDAB in the last 72 hours. Anemia Panel: No  results for input(s): VITAMINB12, FOLATE, FERRITIN, TIBC, IRON, RETICCTPCT in the last 72 hours. Sepsis Labs: No results for input(s): PROCALCITON, LATICACIDVEN in the last 168 hours.  No results found for this or any previous visit (from the past 240 hour(s)).       Radiology Studies: No results found.      Scheduled Meds:  chlorhexidine  15 mL Mouth Rinse BID   Chlorhexidine Gluconate Cloth  6 each Topical Daily   clotrimazole  1 application Topical BID   diclofenac Sodium  4 g Topical QID   dicyclomine  20 mg Oral TID AC & HS   hydrocortisone cream  1 application Topical BID   ketoconazole  1 application Topical Once per day on Mon Thu   lactulose  20 g Oral Daily   LORazepam  1 mg Intravenous Q4H   mouth rinse  15 mL Mouth Rinse BID   risperiDONE  2 mg Oral QHS   scopolamine  3 patch Transdermal Q72H   Continuous Infusions:  morphine 7 mg/hr (06/15/21 0549)     LOS: 23 days    Time spent: 37 minutes spent on chart review, discussion with nursing staff, consultants, updating family and interview/physical exam; more than 50% of that time was spent in counseling and/or coordination of care.    Alvira Philips [place], DO [facility] Available via Epic secure chat 7am-7pm After these hours, please refer to coverage provider listed on [email or web address] 06/15/2021, 10:26 AM

## 2021-06-15 NOTE — Progress Notes (Signed)
Daily Progress Note   Patient Name: [name]       Date: 06/15/2021 DOB: Jan 19, 1961  Age: 61 y.o. MRN#: [id number] Attending Physician: [place] Eric J, DO Primary Care Physician: Audley Hose, MD Admit Date: 05/22/2021  Reason for Consultation/Follow-up: Establishing goals of care  Subjective: Awakens easily, responds to questions asked, states that his back is hurting him. No family at bedside at the time of my visit.      Length of Stay: 23  Current Medications: Scheduled Meds:   chlorhexidine  15 mL Mouth Rinse BID   Chlorhexidine Gluconate Cloth  6 each Topical Daily   clotrimazole  1 application Topical BID   diclofenac Sodium  4 g Topical QID   dicyclomine  20 mg Oral TID AC & HS   hydrocortisone cream  1 application Topical BID   ketoconazole  1 application Topical Once per day on Mon Thu   lactulose  20 g Oral Daily   LORazepam  1 mg Intravenous Q4H   mouth rinse  15 mL Mouth Rinse BID   risperiDONE  2 mg Oral QHS   scopolamine  3 patch Transdermal Q72H    Continuous Infusions:  morphine 7 mg/hr (06/15/21 0549)    PRN Meds: acetaminophen **OR** acetaminophen, albuterol, alum & mag hydroxide-simeth, bisacodyl, bisacodyl, glycopyrrolate, guaiFENesin-dextromethorphan, haloperidol lactate, liver oil-zinc oxide, LORazepam, morphine, ondansetron **OR** ondansetron (ZOFRAN) IV, simethicone  Physical Exam           Has contractures Resting in bed Complains of back pain.  S 1 S 2  Regular work of breathing.  No audible secretions noted His abdomen is   distended.     Vital Signs: BP 123/65 (BP Location: Right Arm)    Pulse 69    Temp 97.8 F (36.6 C) (Oral)    Resp 16    Ht 6' (1.829 m)    Wt 103.4 kg    SpO2 94%    BMI 30.92 kg/m  SpO2: SpO2: 94 % O2  Device: O2 Device: Room Air O2 Flow Rate: O2 Flow Rate (L/min): 3 L/min  Intake/output summary:  Intake/Output Summary (Last 24 hours) at 06/15/2021 1256 Last data filed at 06/15/2021 0600 Gross per 24 hour  Intake 608.44 ml  Output 2100 ml  Net -1491.56 ml    LBM: Last  BM Date: 06/12/21 Baseline Weight: Weight: 59.8 kg Most recent weight: Weight: 103.4 kg       Palliative Assessment/Data:      Patient Active Problem List   Diagnosis Date Noted   Palliative care by specialist    Portal vein thrombosis 06/05/2021   Bilateral pleural effusion 06/05/2021   Hyperammonemia ([place]) 06/05/2021   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   Malnutrition of moderate degree 05/31/2021   Acute dyspnea 05/22/2021   Acute on chronic diastolic CHF (congestive heart failure) ([place]) 05/22/2021   Chronic diastolic CHF (congestive heart failure) ([place]) 05/22/2021   Protein-calorie malnutrition, severe 02/28/2021   Iron deficiency anemia 02/20/2021   Obsessive compulsive disorder    Cellulitis of sacral region 12/06/2020   Right pulmonary infiltrate on CXR 10/03/2020   Dysphagia 10/03/2020   GERD without esophagitis 10/03/2020   Aspiration pneumonia ([place]) 10/03/2020   Recurrent right pleural effusion 10/02/2020   Abnormal CT scan, colon    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/05/2020   Nausea and vomiting 07/31/2020   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 ([place]) 10/08/2019   Osteomyelitis ([place]) 10/08/2019   Acute osteomyelitis of right ankle or foot (HCC)    Ulcerated, foot, right, with necrosis of bone (HCC)    Pseudomonas aeruginosa infection 09/06/2019   MRSA infection 09/06/2019   Lactic acidosis    Stage 3 skin ulcer of sacral region ([place]) 08/10/2018   Gastric wall thickening 08/10/2018   Alkaline phosphatase elevation 08/10/2018   Cholelithiasis 08/10/2018   Microcytic anemia    Hiatal hernia 04/07/2018   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) 01/20/2016    Functional quadriplegia ([place]) 12/15/2015   Cerebral palsy, quadriplegic ([place])    Pre-diabetes 08/31/2015   Foot ulcer, limited to breakdown of skin ([place]) 06/08/2015   Constipation 11/01/2014   Seasonal allergies 11/01/2014   Pressure ulcer of foot 03/14/2014   CAP (community acquired pneumonia) 03/13/2014   Neuropathy 01/13/2013   Decubitus ulcer of left hip, stage 3 ([place]) 01/08/2013   URINARY INCONTINENCE 02/09/2010   Intellectual disability 07/24/2006   Infantile cerebral palsy ([place]) 07/24/2006    Palliative Care Assessment & Plan   Patient Profile:    Assessment:  61 year old gentleman from a group home, history of chronic diastolic congestive heart failure, history of right foot amputation iron deficiency anemia anxiety bipolar disorder, OCD, cerebral palsy, chronic bronchitis constipation allergies, history of gastric ulcer history of GI bleed, GERD hiatal hernia dyslipidemia history of pressure ulcer of sacral area, history of Pseudomonas infection and recurrent urinary tract infections.  Patient has functional quadriplegia.  Brought in from his group home due to productive cough, work-up revealed community-acquired pneumonia with concern for possible aspiration as well as large bilateral pleural effusions.  Patient admitted to hospital medicine service, started on antibiotics, also underwent a right thoracenteses and a left thoracenteses. Ongoing issues this hospitalization include concern for recurrent aspiration, speech therapist have been following and they have recommended for only comfort feeds.   Recommendations/Plan: Pain: continue Morphine   infusion.  Continue comfort care Anticipate hospital death versus consideration for transfer to residential hospice, discussed with [facility] colleague Suanne Marker who will reach out to sister HCPOA [name].  Continue current Ativan.       Code Status:    Code Status Orders  (From admission, onward)           Start     Ordered    05/22/21 1709  Do not attempt resuscitation (DNR)  Continuous       Question Answer Comment  In the event of cardiac or respiratory ARREST Do not call a code blue   In the event of cardiac or respiratory ARREST Do not perform Intubation, CPR, defibrillation or ACLS   In the event of cardiac or respiratory ARREST Use medication by any route, position, wound care, and other measures to relive pain and suffering. May use oxygen, suction and manual treatment of airway obstruction as needed for comfort.      05/22/21 1711           Code Status History     Date Active Date Inactive Code Status Order ID Comments User Context   05/22/2021 1604 05/22/2021 1711 Full Code [id number]  [name], MD ED   02/21/2021 0648 03/03/2021 2347 Full Code [id number]  Ivor Costa, MD ED   02/20/2021 1801 02/21/2021 0648 DNR [id number]  Ivor Costa, MD ED   12/06/2020 2258 12/16/2020 0713 Full Code [id number]  Tacey Ruiz, MD Inpatient   10/03/2020 0055 10/06/2020 2346 DNR [id number]  Vernelle Emerald, MD ED   10/03/2020 0055 10/03/2020 0055 DNR [id number]  Vernelle Emerald, MD ED   09/05/2020 1039 09/07/2020 2311 DNR [id number]  Terrilee Croak, MD Inpatient   09/05/2020 0258 09/05/2020 1038 Full Code [id number]  Dwyane Dee, MD ED   07/31/2020 1758 08/04/2020 0323 Full Code [id number]  Bonnielee Haff, MD ED   10/08/2019 1815 10/09/2019 2042 Full Code [id number]  Cherylann Ratel A, DO Inpatient   04/08/2019 1316 04/12/2019 2132 Full Code [id number]  [name], DO ED   08/10/2018 1642 08/15/2018 1737 DNR [id number]  Annita Brod, MD Inpatient   01/14/2016 0946 01/18/2016 2226 DNR [id number]  Dory Horn, NP Inpatient   01/08/2016 1522 01/14/2016 0946 Full Code [id number]  Smiley Houseman, MD Inpatient   10/19/2015 2054 10/22/2015 1646 DNR [id number]  Aquilla Hacker, MD Inpatient   12/14/2014 1236 12/15/2014 1710 Full Code [id number]  Newt Minion, MD Inpatient   10/16/2014 1036 10/20/2014 1813 Full Code  [id number]  Leone Brand, MD Inpatient   03/13/2014 0323 03/14/2014 2132 Full Code [id number]  Hilton Sinclair, MD Inpatient       Prognosis:  limited to hours to days   Discharge Planning:   hospital death versus residential hospice.    Care plan was discussed with  IDT  Thank you for allowing the Palliative Medicine Team to assist in the care of this patient.  Loistine Chance, MD  Please contact Palliative Medicine Team phone at [phone] for questions and concerns.

## 2021-06-15 NOTE — TOC Progression Note (Signed)
Transition of Care [facility]) - Progression Note    Patient Details  Name: [name] MRN: [id number] Date of Birth: Oct 02, 1960  Transition of [facility]) CM/SW Contact  Golda Acre, RN Phone Number: 06/15/2021, 1:25 PM  Clinical Narrative:    Tct-sister [name] Fulcher/please restart the residential hospice referral.   Expected Discharge Plan: Hospice Medical Facility Barriers to Discharge: No Barriers Identified  Expected Discharge Plan and Services Expected Discharge Plan: Hospice Medical Facility       Living arrangements for the past 2 months:  (Group home)                                       Social Determinants of Health (SDOH) Interventions    Readmission Risk Interventions Readmission Risk Prevention Plan 08/03/2020  Post Dischage Appt Complete  Medication Screening Complete  Some recent data might be hidden

## 2021-06-16 NOTE — Progress Notes (Signed)
Wasted morphine 7.5 mL.  Priscella Mann, RN, witness.  Bradd Burner, RN

## 2021-06-16 NOTE — Progress Notes (Signed)
Witnessed waste of MS 7.5 mls with Garwin Brothers, RN

## 2021-06-16 NOTE — Progress Notes (Signed)
PROGRESS NOTE    [name]  [id number] DOB: [birth year] DOA: 05/22/2021 PCP: [place], MD    Brief Narrative:  [name] is a 61 year old male with past medical history significant for chronic diastolic congestive heart failure, iron deficiency anemia, anxiety, bipolar disorder, intellectual disability, obsessive-compulsive disorder, cerebral palsy, chronic bronchitis, constipation, history of upper GI bleed secondary to gastric ulcer, GERD, hiatal hernia, hyperlipidemia, osteomyelitis of right foot s/p amputation, sacral decubitus ulcer, recurrent UTIs, vitamin D deficiency, functional quadriplegia who presented to [facility] ED on 12/27 from his group home due to productive cough.  Patient reportedly has trouble sleeping due to the cough with decreased appetite.  In the ED, temperature 99.1 F, HR 106, RR 29, BP 131/73, SPO2 97% on room air.  WBC 12.4, hemoglobin 10.6, platelets 339.  High sensitive troponin within normal limits.  BNP 33.6.  Lactic acid 2.8.  Chest x-ray with cardiomegaly, central pulmonary prominence suggesting CHF, diffuse haziness of both lungs greatest on left with moderate to large bilateral pleural effusion.  Patient was started on azithromycin and ceftriaxone, and IV fluid bolus.  Hospitalist service consulted for further evaluation management.   Assessment & Plan:   Principal Problem:   Acute dyspnea Active Problems:   Infantile cerebral palsy (HCC)   Pressure ulcer of foot   Constipation   Functional quadriplegia (HCC)   Stage 3 skin ulcer of sacral region (HCC)   Microcytic anemia   Lactic acidosis   Hx of right BKA (HCC)   Dysphagia   Aspiration pneumonia (HCC)   Iron deficiency anemia   Protein-calorie malnutrition, severe   Chronic diastolic CHF (congestive heart failure) (HCC)   Malnutrition of moderate degree   Portal vein thrombosis   Bilateral pleural effusion   Hyperammonemia (HCC)   Superior mesenteric vein thrombosis (HCC)    Palliative care by specialist   Community acquired pneumonia Patient initially presented to hospital with productive cough, elevated WBC count.  CT angiogram chest 12/27 with no pulmonary embolism. Completed 5-day course of azithromycin and ceftriaxone.  Bilateral pleural effusion Imaging notable for bilateral pleural effusion.  Patient underwent right-sided thoracentesis by IR on 05/24/2021 and left thoracentesis on 05/25/2021.  Repeat imaging on 06/03/2021 showed persistent pleural effusions and patient underwent left thoracentesis on 06/04/2021. --Now on comfort measures  Portal vein/superior mesenteric vein thrombosis Patient with worsening/persistent abdominal pain.  CT abdomen/pelvis with thrombosis of the main portal and superior mesenteric vein, likely acute with suspected thrombus in the splenic vein.  Was initially started on IV heparin, now discontinued given goals of care transition to comfort measures.  Abdominal pain/constipation: History of chronic constipation, abdominal plain films demonstrated gaseous distention.  Has Foley catheter in place for comfort.  Initially a rectal tube was placed to allow for decompression of the bowel but no appreciable improvements and now discontinued. 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  TSH within normal limits, potassium/magnesium normal.  Was evaluated by cardiology who did not recommend pacemaker and note avoid AV nodal blocking agents.  Patient now transition to comfort measures.  Hyperammonemia Ammonia level was noted be elevated at 90, was started on lactulose, and Depakote was held initially, but checking Depakote level was noted to be subtherapeutic.  Patient received a dose of L-carnitine and follow-up ammonia level improved to 40.  Now on comfort  measures.  Chronic diastolic congestive heart failure Likely etiology of his bilateral pleural effusions.  Now on comfort measures.  Infantile cerebral palsy Functional quadriplegia --Continue muscle relaxants, analgesics, anxiolytics as needed  Obstructive uropathy, chronic --Continue Foley catheter  Goals of care Palliative care consulted and following during hospital course.  Given very poor long-term prognosis, meetings with patient's family with transition to comfort measures.  Pending present hospice placement.   DVT prophylaxis: Comfort care   Code Status: DNR Family Communication: Updated family present at bedside yesterday morning  Disposition Plan:  Level of care: Med-Surg Status is: Inpatient  Remains inpatient appropriate because: On comfort measures, pending residential hospice placement     Consultants:  Cardiology Palliative care Interventional radiology  Procedures:  Right thoracentesis 12/29 Left thoracentesis 12/30 Left thoracentesis 1/9  Antimicrobials:  Azithromycin 12/27 -12/31 Ceftriaxone 12/27 - 1/1 Cefazolin 1/8 - 1/10   Subjective: Patient seen examined at bedside, does not appear to be in acute distress.  No family present this morning.  Remains on morphine drip.  On comfort measures; pending residential hospice  Objective: Vitals:   06/14/21 2030 06/15/21 1234 06/15/21 2122 06/16/21 0606  BP: 130/77 123/65 104/76 98/61  Pulse: (!) 102 69 (!) 102 72  Resp: 16 16 14 14   Temp: 98.3 F (36.8 C) 97.8 F (36.6 C) 98.5 F (36.9 C) 98.3 F (36.8 C)  TempSrc: Oral Oral Oral Axillary  SpO2: 97% 94% 95% 95%  Weight:      Height:        Intake/Output Summary (Last 24 hours) at 06/16/2021 1155 Last data filed at 06/16/2021 4782 Gross per 24 hour  Intake 416.87 ml  Output 600 ml  Net -183.13 ml   Filed Weights   06/03/21 0500 06/03/21 0628 06/05/21 0500  Weight: 99.8 kg 99.8 kg 103.4 kg    Examination:  General exam:  Awake/alert, NAD chronically ill in appearance Respiratory system: Clear to auscultation. Respiratory effort normal.  On room air Cardiovascular system: S1 & S2 heard, RRR. No JVD, murmurs, rubs, gallops or clicks. No pedal edema. Gastrointestinal system: Abdomen is distended, diffusely tender to palpation. No organomegaly or masses felt. Normal bowel sounds heard. Central nervous system: Awake.  Extremities: Extremities contracted Skin: sacral decubitus ulcer Psychiatry: Unable to fully assess due to his inability to cooperate with exam/history    Data Reviewed: I have personally reviewed following labs and imaging studies  CBC: No results for input(s): WBC, NEUTROABS, HGB, HCT, MCV, PLT in the last 168 hours. Basic Metabolic Panel: No results for input(s): NA, K, CL, CO2, GLUCOSE, BUN, CREATININE, CALCIUM, MG, PHOS in the last 168 hours. GFR: CrCl cannot be calculated (This lab value cannot be used to calculate CrCl because it is not a number: <0.30). Liver Function Tests: No results for input(s): AST, ALT, ALKPHOS, BILITOT, PROT, ALBUMIN in the last 168 hours. No results for input(s): LIPASE, AMYLASE in the last 168 hours. No results for input(s): AMMONIA in the last 168 hours. Coagulation Profile: No results for input(s): INR, PROTIME in the last 168 hours. Cardiac Enzymes: No results for input(s): CKTOTAL, CKMB, CKMBINDEX, TROPONINI in the last 168 hours. BNP (last 3 results) No results for input(s): PROBNP in the last 8760 hours. HbA1C: No results for input(s): HGBA1C in the last 72 hours. CBG: No results for input(s): GLUCAP in the last 168 hours. Lipid Profile: No results for input(s): CHOL, HDL, LDLCALC, TRIG, CHOLHDL, LDLDIRECT in the last 72 hours. Thyroid Function Tests: No results for input(s): TSH, T4TOTAL,  FREET4, T3FREE, THYROIDAB in the last 72 hours. Anemia Panel: No results for input(s): VITAMINB12, FOLATE, FERRITIN, TIBC, IRON, RETICCTPCT in the last 72  hours. Sepsis Labs: No results for input(s): PROCALCITON, LATICACIDVEN in the last 168 hours.  No results found for this or any previous visit (from the past 240 hour(s)).       Radiology Studies: No results found.      Scheduled Meds:  chlorhexidine  15 mL Mouth Rinse BID   Chlorhexidine Gluconate Cloth  6 each Topical Daily   clotrimazole  1 application Topical BID   diclofenac Sodium  4 g Topical QID   dicyclomine  20 mg Oral TID AC & HS   hydrocortisone cream  1 application Topical BID   ketoconazole  1 application Topical Once per day on Mon Thu   lactulose  20 g Oral Daily   LORazepam  1 mg Intravenous Q4H   mouth rinse  15 mL Mouth Rinse BID   risperiDONE  2 mg Oral QHS   scopolamine  3 patch Transdermal Q72H   Continuous Infusions:  morphine 7 mg/hr (06/16/21 0650)     LOS: 24 days    Time spent: 37 minutes spent on chart review, discussion with nursing staff, consultants, updating family and interview/physical exam; more than 50% of that time was spent in counseling and/or coordination of care.    Alvira Philips [place], DO [facility] Available via Epic secure chat 7am-7pm After these hours, please refer to coverage provider listed on [email or web address] 06/16/2021, 11:55 AM

## 2021-06-17 NOTE — Progress Notes (Signed)
PROGRESS NOTE    [name]  NWG:[id number] DOB: [birth year] DOA: 05/22/2021 PCP: [place], MD    Brief Narrative:  [name] is a 61 year old male with past medical history significant for chronic diastolic congestive heart failure, iron deficiency anemia, anxiety, bipolar disorder, intellectual disability, obsessive-compulsive disorder, cerebral palsy, chronic bronchitis, constipation, history of upper GI bleed secondary to gastric ulcer, GERD, hiatal hernia, hyperlipidemia, osteomyelitis of right foot s/p amputation, sacral decubitus ulcer, recurrent UTIs, vitamin D deficiency, functional quadriplegia who presented to [facility] ED on 12/27 from his group home due to productive cough.  Patient reportedly has trouble sleeping due to the cough with decreased appetite.  In the ED, temperature 99.1 F, HR 106, RR 29, BP 131/73, SPO2 97% on room air.  WBC 12.4, hemoglobin 10.6, platelets 339.  High sensitive troponin within normal limits.  BNP 33.6.  Lactic acid 2.8.  Chest x-ray with cardiomegaly, central pulmonary prominence suggesting CHF, diffuse haziness of both lungs greatest on left with moderate to large bilateral pleural effusion.  Patient was started on azithromycin and ceftriaxone, and IV fluid bolus.  Hospitalist service consulted for further evaluation management.   Assessment & Plan:   Principal Problem:   Acute dyspnea Active Problems:   Infantile cerebral palsy (HCC)   Pressure ulcer of foot   Constipation   Functional quadriplegia (HCC)   Stage 3 skin ulcer of sacral region (HCC)   Microcytic anemia   Lactic acidosis   Hx of right BKA (HCC)   Dysphagia   Aspiration pneumonia (HCC)   Iron deficiency anemia   Protein-calorie malnutrition, severe   Chronic diastolic CHF (congestive heart failure) (HCC)   Malnutrition of moderate degree   Portal vein thrombosis   Bilateral pleural effusion   Hyperammonemia (HCC)   Superior mesenteric vein thrombosis (HCC)    Palliative care by specialist   Community acquired pneumonia Patient initially presented to hospital with productive cough, elevated WBC count.  CT angiogram chest 12/27 with no pulmonary embolism. Completed 5-day course of azithromycin and ceftriaxone.  Bilateral pleural effusion Imaging notable for bilateral pleural effusion.  Patient underwent right-sided thoracentesis by IR on 05/24/2021 and left thoracentesis on 05/25/2021.  Repeat imaging on 06/03/2021 showed persistent pleural effusions and patient underwent left thoracentesis on 06/04/2021. --Now on comfort measures  Portal vein/superior mesenteric vein thrombosis Patient with worsening/persistent abdominal pain.  CT abdomen/pelvis with thrombosis of the main portal and superior mesenteric vein, likely acute with suspected thrombus in the splenic vein.  Was initially started on IV heparin, now discontinued given goals of care transition to comfort measures.  Abdominal pain/constipation: History of chronic constipation, abdominal plain films demonstrated gaseous distention.  Has Foley catheter in place for comfort.  Initially a rectal tube was placed to allow for decompression of the bowel but no appreciable improvements and now discontinued. 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  TSH within normal limits, potassium/magnesium normal.  Was evaluated by cardiology who did not recommend pacemaker and note avoid AV nodal blocking agents.  Patient now transition to comfort measures.  Hyperammonemia Ammonia level was noted be elevated at 90, was started on lactulose, and Depakote was held initially, but checking Depakote level was noted to be subtherapeutic.  Patient received a dose of L-carnitine and follow-up ammonia level improved to 40.  Now on comfort measures.  Chronic diastolic  congestive  heart failure Likely etiology of his bilateral pleural effusions.  Now on comfort measures.  Infantile cerebral palsy Functional quadriplegia --Continue muscle relaxants, analgesics, anxiolytics as needed  Obstructive uropathy, chronic --Continue Foley catheter  Goals of care Palliative care consulted and following during hospital course.  Given very poor long-term prognosis, meetings with patient's family with transition to comfort measures.  Pending residential hospice placement.   DVT prophylaxis: Comfort care   Code Status: DNR Family Communication: Updated family present at bedside this morning  Disposition Plan:  Level of care: Med-Surg Status is: Inpatient  Remains inpatient appropriate because: On comfort measures, pending residential hospice placement     Consultants:  Cardiology Palliative care Interventional radiology  Procedures:  Right thoracentesis 12/29 Left thoracentesis 12/30 Left thoracentesis 1/9  Antimicrobials:  Azithromycin 12/27 -12/31 Ceftriaxone 12/27 - 1/1 Cefazolin 1/8 - 1/10   Subjective: Patient seen examined at bedside, does not appear to be in acute distress.  Eating breakfast.  Family present. Remains on morphine drip.  On comfort measures; pending residential hospice  Objective: Vitals:   06/15/21 2122 06/16/21 0606 06/16/21 1419 06/16/21 2240  BP: 104/76 98/61 97/68  (!) 108/53  Pulse: (!) 102 72 67 63  Resp: 14 14 14 17   Temp: 98.5 F (36.9 C) 98.3 F (36.8 C) 98.2 F (36.8 C) 98 F (36.7 C)  TempSrc: Oral Axillary Oral Oral  SpO2: 95% 95% 90% (!) 88%  Weight:      Height:        Intake/Output Summary (Last 24 hours) at 06/17/2021 0950 Last data filed at 06/17/2021 0845 Gross per 24 hour  Intake 360.18 ml  Output 325 ml  Net 35.18 ml   Filed Weights   06/03/21 0500 06/03/21 0628 06/05/21 0500  Weight: 99.8 kg 99.8 kg 103.4 kg    Examination:  General exam: Awake/alert, NAD chronically ill in  appearance Respiratory system: Clear to auscultation. Respiratory effort normal.  On room air Cardiovascular system: S1 & S2 heard, RRR. No JVD, murmurs, rubs, gallops or clicks. No pedal edema. Gastrointestinal system: Abdomen is distended, diffusely tender to palpation. No organomegaly or masses felt. Normal bowel sounds heard. Central nervous system: Awake.  Extremities: Extremities contracted Skin: sacral decubitus ulcer Psychiatry: Unable to fully assess due to his inability to cooperate with exam/history    Data Reviewed: I have personally reviewed following labs and imaging studies  CBC: No results for input(s): WBC, NEUTROABS, HGB, HCT, MCV, PLT in the last 168 hours. Basic Metabolic Panel: No results for input(s): NA, K, CL, CO2, GLUCOSE, BUN, CREATININE, CALCIUM, MG, PHOS in the last 168 hours. GFR: CrCl cannot be calculated (This lab value cannot be used to calculate CrCl because it is not a number: <0.30). Liver Function Tests: No results for input(s): AST, ALT, ALKPHOS, BILITOT, PROT, ALBUMIN in the last 168 hours. No results for input(s): LIPASE, AMYLASE in the last 168 hours. No results for input(s): AMMONIA in the last 168 hours. Coagulation Profile: No results for input(s): INR, PROTIME in the last 168 hours. Cardiac Enzymes: No results for input(s): CKTOTAL, CKMB, CKMBINDEX, TROPONINI in the last 168 hours. BNP (last 3 results) No results for input(s): PROBNP in the last 8760 hours. HbA1C: No results for input(s): HGBA1C in the last 72 hours. CBG: No results for input(s): GLUCAP in the last 168 hours. Lipid Profile: No results for input(s): CHOL, HDL, LDLCALC, TRIG, CHOLHDL, LDLDIRECT in the last 72 hours. Thyroid Function Tests: No results for input(s): TSH, T4TOTAL, FREET4, T3FREE, THYROIDAB  in the last 72 hours. Anemia Panel: No results for input(s): VITAMINB12, FOLATE, FERRITIN, TIBC, IRON, RETICCTPCT in the last 72 hours. Sepsis Labs: No results for  input(s): PROCALCITON, LATICACIDVEN in the last 168 hours.  No results found for this or any previous visit (from the past 240 hour(s)).       Radiology Studies: No results found.      Scheduled Meds:  chlorhexidine  15 mL Mouth Rinse BID   Chlorhexidine Gluconate Cloth  6 each Topical Daily   clotrimazole  1 application Topical BID   diclofenac Sodium  4 g Topical QID   dicyclomine  20 mg Oral TID AC & HS   hydrocortisone cream  1 application Topical BID   ketoconazole  1 application Topical Once per day on Mon Thu   lactulose  20 g Oral Daily   LORazepam  1 mg Intravenous Q4H   mouth rinse  15 mL Mouth Rinse BID   risperiDONE  2 mg Oral QHS   scopolamine  3 patch Transdermal Q72H   Continuous Infusions:  morphine 7 mg/hr (06/17/21 0703)     LOS: 25 days    Time spent: 36 minutes spent on chart review, discussion with nursing staff, consultants, updating family and interview/physical exam; more than 50% of that time was spent in counseling and/or coordination of care.    Alvira Philips [place], DO [facility] Available via Epic secure chat 7am-7pm After these hours, please refer to coverage provider listed on [email or web address] 06/17/2021, 9:50 AM

## 2021-06-17 NOTE — Progress Notes (Signed)
Per patient request, RN increased morphine gtt from 7 to 8 mg/hr.  Patient was noted to be making negative vocalizations while alert, asking for additional pain medication (several PRN morphine boluses given this shift).  Pt's sister [name] at bedside corroborated that pt seemed to be uncomfortable.  Patient reminded that pain medicine boluses were available every 30 min.  Will continue to assess pain.  Angie Fava, RN

## 2021-06-17 NOTE — Progress Notes (Signed)
Daily Progress Note   Patient Name: [name]       Date: 06/17/2021 DOB: 22-Jun-1960  Age: 61 y.o. MRN#: [id number] Attending Physician: [place] Eric J, DO Primary Care Physician: Audley Hose, MD Admit Date: 05/22/2021  Reason for Consultation/Follow-up: Establishing goals of care  Subjective: Awake today, tearful due to pain in his side/back. Sister at bedside     Length of Stay: 25  Current Medications: Scheduled Meds:   chlorhexidine  15 mL Mouth Rinse BID   Chlorhexidine Gluconate Cloth  6 each Topical Daily   clotrimazole  1 application Topical BID   diclofenac Sodium  4 g Topical QID   dicyclomine  20 mg Oral TID AC & HS   hydrocortisone cream  1 application Topical BID   ketoconazole  1 application Topical Once per day on Mon Thu   lactulose  20 g Oral Daily   LORazepam  1 mg Intravenous Q4H   mouth rinse  15 mL Mouth Rinse BID   risperiDONE  2 mg Oral QHS   scopolamine  3 patch Transdermal Q72H    Continuous Infusions:  morphine 7 mg/hr (06/17/21 0703)    PRN Meds: acetaminophen **OR** acetaminophen, albuterol, alum & mag hydroxide-simeth, bisacodyl, bisacodyl, glycopyrrolate, guaiFENesin-dextromethorphan, haloperidol lactate, liver oil-zinc oxide, LORazepam, morphine, ondansetron **OR** ondansetron (ZOFRAN) IV, simethicone  Physical Exam           Has contractures Resting in bed Complains of back pain.  S 1 S 2  Regular work of breathing.  No audible secretions noted His abdomen is   distended.     Vital Signs: BP (!) 108/53 (BP Location: Right Arm)    Pulse 63    Temp 98 F (36.7 C) (Oral)    Resp 17    Ht 6' (1.829 m)    Wt 103.4 kg    SpO2 (!) 88%    BMI 30.92 kg/m  SpO2: SpO2: (!) 88 % O2 Device: O2 Device: Room Air O2 Flow Rate: O2 Flow  Rate (L/min): 3 L/min  Intake/output summary:  Intake/Output Summary (Last 24 hours) at 06/17/2021 1057 Last data filed at 06/17/2021 0845 Gross per 24 hour  Intake 314.39 ml  Output 325 ml  Net -10.61 ml    LBM: Last BM Date: 06/12/21 Baseline Weight: Weight: 59.8 kg Most  recent weight: Weight: 103.4 kg       Palliative Assessment/Data:      Patient Active Problem List   Diagnosis Date Noted   Palliative care by specialist    Portal vein thrombosis 06/05/2021   Bilateral pleural effusion 06/05/2021   Hyperammonemia ([place]) 06/05/2021   Superior mesenteric vein thrombosis ([place]) 06/05/2021   Malnutrition of moderate degree 05/31/2021   Acute dyspnea 05/22/2021   Acute on chronic diastolic CHF (congestive heart failure) ([place]) 05/22/2021   Chronic diastolic CHF (congestive heart failure) ([place]) 05/22/2021   Protein-calorie malnutrition, severe 02/28/2021   Iron deficiency anemia 02/20/2021   Obsessive compulsive disorder    Cellulitis of sacral region 12/06/2020   Right pulmonary infiltrate on CXR 10/03/2020   Dysphagia 10/03/2020   GERD without esophagitis 10/03/2020   Aspiration pneumonia ([place]) 10/03/2020   Recurrent right pleural effusion 10/02/2020   Abnormal CT scan, colon    Hx of right BKA ([place]) 09/05/2020   Nausea and vomiting 07/31/2020   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 ([place]) 10/08/2019   Osteomyelitis ([place]) 10/08/2019   Acute osteomyelitis of right ankle or foot (HCC)    Ulcerated, foot, right, with necrosis of bone (HCC)    Pseudomonas aeruginosa infection 09/06/2019   MRSA infection 09/06/2019   Lactic acidosis    Stage 3 skin ulcer of sacral region ([place]) 08/10/2018   Gastric wall thickening 08/10/2018   Alkaline phosphatase elevation 08/10/2018   Cholelithiasis 08/10/2018   Microcytic anemia    Hiatal hernia 04/07/2018   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) 01/20/2016   Functional quadriplegia ([place]) 12/15/2015   Cerebral  palsy, quadriplegic ([place])    Pre-diabetes 08/31/2015   Foot ulcer, limited to breakdown of skin ([place]) 06/08/2015   Constipation 11/01/2014   Seasonal allergies 11/01/2014   Pressure ulcer of foot 03/14/2014   CAP (community acquired pneumonia) 03/13/2014   Neuropathy 01/13/2013   Decubitus ulcer of left hip, stage 3 ([place]) 01/08/2013   URINARY INCONTINENCE 02/09/2010   Intellectual disability 07/24/2006   Infantile cerebral palsy ([place]) 07/24/2006    Palliative Care Assessment & Plan   Patient Profile:    Assessment:  61 year old gentleman from a group home, history of chronic diastolic congestive heart failure, history of right foot amputation iron deficiency anemia anxiety bipolar disorder, OCD, cerebral palsy, chronic bronchitis constipation allergies, history of gastric ulcer history of GI bleed, GERD hiatal hernia dyslipidemia history of pressure ulcer of sacral area, history of Pseudomonas infection and recurrent urinary tract infections.  Patient has functional quadriplegia.  Brought in from his group home due to productive cough, work-up revealed community-acquired pneumonia with concern for possible aspiration as well as large bilateral pleural effusions.  Patient admitted to hospital medicine service, started on antibiotics, also underwent a right thoracenteses and a left thoracenteses. Ongoing issues this hospitalization include concern for recurrent aspiration, speech therapist have been following and they have recommended for only comfort feeds.   Recommendations/Plan: Pain: continue Morphine   infusion. Ok to continue to increase the rate of infusion for comfort purposes, also has bolus IV Morphine available. Now at 7 mg morphine per hour infusion.  Continue comfort care Anticipate hospital death versus consideration for transfer to residential hospice, discussed with sister at bedside who is in agreement.  Continue current Ativan.       Code Status:    Code Status Orders   (From admission, onward)           Start  Ordered   05/22/21 1709  Do not attempt resuscitation (DNR)  Continuous       Question Answer Comment  In the event of cardiac or respiratory ARREST Do not call a code blue   In the event of cardiac or respiratory ARREST Do not perform Intubation, CPR, defibrillation or ACLS   In the event of cardiac or respiratory ARREST Use medication by any route, position, wound care, and other measures to relive pain and suffering. May use oxygen, suction and manual treatment of airway obstruction as needed for comfort.      05/22/21 1711           Code Status History     Date Active Date Inactive Code Status Order ID Comments User Context   05/22/2021 1604 05/22/2021 1711 Full Code [id number]  [name], MD ED   02/21/2021 0648 03/03/2021 2347 Full Code [id number]  Ivor Costa, MD ED   02/20/2021 1801 02/21/2021 0648 DNR [id number]  Ivor Costa, MD ED   12/06/2020 2258 12/16/2020 0713 Full Code [id number]  Tacey Ruiz, MD Inpatient   10/03/2020 0055 10/06/2020 2346 DNR [id number]  Vernelle Emerald, MD ED   10/03/2020 0055 10/03/2020 0055 DNR [id number]  Vernelle Emerald, MD ED   09/05/2020 1039 09/07/2020 2311 DNR [id number]  Terrilee Croak, MD Inpatient   09/05/2020 0258 09/05/2020 1038 Full Code [id number]  Dwyane Dee, MD ED   07/31/2020 1758 08/04/2020 0323 Full Code [id number]  Bonnielee Haff, MD ED   10/08/2019 1815 10/09/2019 2042 Full Code [id number]  Cherylann Ratel A, DO Inpatient   04/08/2019 1316 04/12/2019 2132 Full Code [id number]  [name], DO ED   08/10/2018 1642 08/15/2018 1737 DNR [id number]  Annita Brod, MD Inpatient   01/14/2016 0946 01/18/2016 2226 DNR [id number]  Dory Horn, NP Inpatient   01/08/2016 1522 01/14/2016 0946 Full Code [id number]  Smiley Houseman, MD Inpatient   10/19/2015 2054 10/22/2015 1646 DNR [id number]  Aquilla Hacker, MD Inpatient   12/14/2014 1236 12/15/2014 1710 Full Code [id number]  Newt Minion, MD Inpatient   10/16/2014 1036 10/20/2014 1813 Full Code [id number]  Leone Brand, MD Inpatient   03/13/2014 0323 03/14/2014 2132 Full Code [id number]  Hilton Sinclair, MD Inpatient       Prognosis:  limited to hours to days   Discharge Planning:   hospital death versus residential hospice.    Care plan was discussed with  IDT  Thank you for allowing the Palliative Medicine Team to assist in the care of this patient.  Loistine Chance, MD  Please contact Palliative Medicine Team phone at [phone] for questions and concerns.

## 2021-06-18 MED ORDER — LIP MEDEX EX OINT
TOPICAL_OINTMENT | CUTANEOUS | Status: DC | PRN
  Filled 2021-06-18: qty 7

## 2021-06-18 MED ORDER — MORPHINE BOLUS VIA INFUSION
4.0000 mg | Freq: Once | INTRAVENOUS | Status: AC
  Administered 2021-06-18: 4 mg via INTRAVENOUS
  Filled 2021-06-18: qty 4

## 2021-06-18 NOTE — Progress Notes (Signed)
PROGRESS NOTE    [name]  [name]:[id number] DOB: 1961-04-03 DOA: 05/22/2021 PCP: [place], MD    Brief Narrative:  [name] is a 61 year old male with past medical history significant for chronic diastolic congestive heart failure, iron deficiency anemia, anxiety, bipolar disorder, intellectual disability, obsessive-compulsive disorder, cerebral palsy, chronic bronchitis, constipation, history of upper GI bleed secondary to gastric ulcer, GERD, hiatal hernia, hyperlipidemia, osteomyelitis of right foot s/p amputation, sacral decubitus ulcer, recurrent UTIs, vitamin D deficiency, functional quadriplegia who presented to [facility] ED on 12/27 from his group home due to productive cough.  Patient reportedly has trouble sleeping due to the cough with decreased appetite.  In the ED, temperature 99.1 F, HR 106, RR 29, BP 131/73, SPO2 97% on room air.  WBC 12.4, hemoglobin 10.6, platelets 339.  High sensitive troponin within normal limits.  BNP 33.6.  Lactic acid 2.8.  Chest x-ray with cardiomegaly, central pulmonary prominence suggesting CHF, diffuse haziness of both lungs greatest on left with moderate to large bilateral pleural effusion.  Patient was started on azithromycin and ceftriaxone, and IV fluid bolus.  Hospitalist service consulted for further evaluation management.   Assessment & Plan:   Principal Problem:   Acute dyspnea Active Problems:   Infantile cerebral palsy (HCC)   Pressure ulcer of foot   Constipation   Functional quadriplegia (HCC)   Stage 3 skin ulcer of sacral region (HCC)   Microcytic anemia   Lactic acidosis   Hx of right BKA (HCC)   Dysphagia   Aspiration pneumonia (HCC)   Iron deficiency anemia   Protein-calorie malnutrition, severe   Chronic diastolic CHF (congestive heart failure) (HCC)   Malnutrition of moderate degree   Portal vein thrombosis   Bilateral pleural effusion   Hyperammonemia (HCC)   Superior mesenteric vein thrombosis (HCC)    Palliative care by specialist   Community acquired pneumonia Patient initially presented to hospital with productive cough, elevated WBC count.  CT angiogram chest 12/27 with no pulmonary embolism. Completed 5-day course of azithromycin and ceftriaxone.  Bilateral pleural effusion Imaging notable for bilateral pleural effusion.  Patient underwent right-sided thoracentesis by IR on 05/24/2021 and left thoracentesis on 05/25/2021.  Repeat imaging on 06/03/2021 showed persistent pleural effusions and patient underwent left thoracentesis on 06/04/2021. --Now on comfort measures  Portal vein/superior mesenteric vein thrombosis Patient with worsening/persistent abdominal pain.  CT abdomen/pelvis with thrombosis of the main portal and superior mesenteric vein, likely acute with suspected thrombus in the splenic vein.  Was initially started on IV heparin, now discontinued given goals of care transition to comfort measures.  Abdominal pain/constipation: History of chronic constipation, abdominal plain films demonstrated gaseous distention.  Has Foley catheter in place for comfort.  Initially a rectal tube was placed to allow for decompression of the bowel but no appreciable improvements and now discontinued. 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  TSH within normal limits, potassium/magnesium normal.  Was evaluated by cardiology who did not recommend pacemaker and note avoid AV nodal blocking agents.  Patient now transition to comfort measures.  Hyperammonemia Ammonia level was noted be elevated at 90, was started on lactulose, and Depakote was held initially, but checking Depakote level was noted to be subtherapeutic.  Patient received a dose of L-carnitine and follow-up ammonia level improved to 40.  Now on comfort  measures.  Chronic diastolic congestive heart failure Likely etiology of his bilateral pleural effusions.  Now on comfort measures.  Infantile cerebral palsy Functional quadriplegia --Continue muscle relaxants, analgesics, anxiolytics as needed  Obstructive uropathy, chronic --Continue Foley catheter  Goals of care Palliative care consulted and following during hospital course.  Given very poor long-term prognosis, meetings with patient's family with transition to comfort measures.  Pending residential hospice placement.   DVT prophylaxis: Comfort care   Code Status: DNR Family Communication: Updated family present at bedside this morning  Disposition Plan:  Level of care: Med-Surg Status is: Inpatient  Remains inpatient appropriate because: On comfort measures, pending residential hospice placement     Consultants:  Cardiology Palliative care Interventional radiology  Procedures:  Right thoracentesis 12/29 Left thoracentesis 12/30 Left thoracentesis 1/9  Antimicrobials:  Azithromycin 12/27 -12/31 Ceftriaxone 12/27 - 1/1 Cefazolin 1/8 - 1/10   Subjective: Patient seen examined at bedside, complaining of pain.  No family present this morning.  Remains on morphine drip.  Discussed with RN about giving as needed dose of morphine this morning.  On comfort measures; pending residential hospice; poor/grim prognosis.  Objective: Vitals:   06/16/21 1419 06/16/21 2240 06/17/21 1221 06/17/21 1949  BP: 97/68 (!) 108/53 (!) 116/93 129/79  Pulse: 67 63 77 80  Resp: 14 17 16 17   Temp: 98.2 F (36.8 C) 98 F (36.7 C) 97.8 F (36.6 C)   TempSrc: Oral Oral Oral   SpO2: 90% (!) 88% 96% 92%  Weight:      Height:        Intake/Output Summary (Last 24 hours) at 06/18/2021 1233 Last data filed at 06/18/2021 1027 Gross per 24 hour  Intake 320 ml  Output 1600 ml  Net -1280 ml   Filed Weights   06/03/21 0500 06/03/21 0628 06/05/21 0500  Weight: 99.8 kg 99.8 kg 103.4 kg     Examination:  General exam: Awake/alert, NAD chronically ill in appearance Respiratory system: Clear to auscultation. Respiratory effort normal.  On room air Cardiovascular system: S1 & S2 heard, RRR. No JVD, murmurs, rubs, gallops or clicks. No pedal edema. Gastrointestinal system: Abdomen is distended, diffusely tender to palpation. No organomegaly or masses felt. Normal bowel sounds heard. Central nervous system: Awake.  Extremities: Extremities contracted Skin: sacral decubitus ulcer Psychiatry: Unable to fully assess due to his inability to cooperate with exam/history    Data Reviewed: I have personally reviewed following labs and imaging studies  CBC: No results for input(s): WBC, NEUTROABS, HGB, HCT, MCV, PLT in the last 168 hours. Basic Metabolic Panel: No results for input(s): NA, K, CL, CO2, GLUCOSE, BUN, CREATININE, CALCIUM, MG, PHOS in the last 168 hours. GFR: CrCl cannot be calculated (This lab value cannot be used to calculate CrCl because it is not a number: <0.30). Liver Function Tests: No results for input(s): AST, ALT, ALKPHOS, BILITOT, PROT, ALBUMIN in the last 168 hours. No results for input(s): LIPASE, AMYLASE in the last 168 hours. No results for input(s): AMMONIA in the last 168 hours. Coagulation Profile: No results for input(s): INR, PROTIME in the last 168 hours. Cardiac Enzymes: No results for input(s): CKTOTAL, CKMB, CKMBINDEX, TROPONINI in the last 168 hours. BNP (last 3 results) No results for input(s): PROBNP in the last 8760 hours. HbA1C: No results for input(s): HGBA1C in the last 72 hours. CBG: No results for input(s): GLUCAP in the last 168 hours. Lipid Profile: No results for input(s): CHOL, HDL, LDLCALC, TRIG, CHOLHDL, LDLDIRECT in the last 72  hours. Thyroid Function Tests: No results for input(s): TSH, T4TOTAL, FREET4, T3FREE, THYROIDAB in the last 72 hours. Anemia Panel: No results for input(s): VITAMINB12, FOLATE, FERRITIN, TIBC,  IRON, RETICCTPCT in the last 72 hours. Sepsis Labs: No results for input(s): PROCALCITON, LATICACIDVEN in the last 168 hours.  No results found for this or any previous visit (from the past 240 hour(s)).       Radiology Studies: No results found.      Scheduled Meds:  chlorhexidine  15 mL Mouth Rinse BID   Chlorhexidine Gluconate Cloth  6 each Topical Daily   clotrimazole  1 application Topical BID   diclofenac Sodium  4 g Topical QID   dicyclomine  20 mg Oral TID AC & HS   hydrocortisone cream  1 application Topical BID   ketoconazole  1 application Topical Once per day on Mon Thu   lactulose  20 g Oral Daily   LORazepam  1 mg Intravenous Q4H   mouth rinse  15 mL Mouth Rinse BID   risperiDONE  2 mg Oral QHS   scopolamine  3 patch Transdermal Q72H   Continuous Infusions:  morphine 8 mg/hr (06/18/21 0525)     LOS: 26 days    Time spent: 36 minutes spent on chart review, discussion with nursing staff, consultants, updating family and interview/physical exam; more than 50% of that time was spent in counseling and/or coordination of care.    Alvira Philips [place], DO [facility] Available via Epic secure chat 7am-7pm After these hours, please refer to coverage provider listed on [email or web address] 06/18/2021, 12:33 PM

## 2021-06-18 NOTE — Progress Notes (Signed)
Daily Progress Note   Patient Name: [name]       Date: 06/18/2021 DOB: [birth year]  Age: 61 y.o. MRN#: [id number] Attending Physician: [place] Eric J, DO Primary Care Physician: Audley Hose, MD Admit Date: 05/22/2021  Reason for Consultation/Follow-up: Establishing goals of care  Subjective: Resting in bed, no distress, medications noted.      Length of Stay: 26  Current Medications: Scheduled Meds:   chlorhexidine  15 mL Mouth Rinse BID   Chlorhexidine Gluconate Cloth  6 each Topical Daily   clotrimazole  1 application Topical BID   diclofenac Sodium  4 g Topical QID   dicyclomine  20 mg Oral TID AC & HS   hydrocortisone cream  1 application Topical BID   ketoconazole  1 application Topical Once per day on Mon Thu   lactulose  20 g Oral Daily   LORazepam  1 mg Intravenous Q4H   mouth rinse  15 mL Mouth Rinse BID   risperiDONE  2 mg Oral QHS   scopolamine  3 patch Transdermal Q72H    Continuous Infusions:  morphine 8 mg/hr (06/18/21 0525)    PRN Meds: acetaminophen **OR** acetaminophen, albuterol, alum & mag hydroxide-simeth, bisacodyl, bisacodyl, glycopyrrolate, guaiFENesin-dextromethorphan, haloperidol lactate, lip balm, liver oil-zinc oxide, LORazepam, morphine, ondansetron **OR** ondansetron (ZOFRAN) IV, simethicone  Physical Exam           Has contractures Resting in bed Complains of back pain.  S 1 S 2  Regular work of breathing.  No audible secretions noted His abdomen is   distended.     Vital Signs: BP 129/79 (BP Location: Right Arm)    Pulse 80    Temp 97.8 F (36.6 C) (Oral)    Resp 17    Ht 6' (1.829 m)    Wt 103.4 kg    SpO2 92%    BMI 30.92 kg/m  SpO2: SpO2: 92 % O2 Device: O2 Device: Room Air O2 Flow Rate: O2 Flow Rate (L/min): 3  L/min  Intake/output summary:  Intake/Output Summary (Last 24 hours) at 06/18/2021 1202 Last data filed at 06/18/2021 1027 Gross per 24 hour  Intake 320 ml  Output 1600 ml  Net -1280 ml    LBM: Last BM Date: 06/12/21 Baseline Weight: Weight: 59.8 kg Most recent weight: Weight: 103.4 kg  Palliative Assessment/Data:      Patient Active Problem List   Diagnosis Date Noted   Palliative care by specialist    Portal vein thrombosis 06/05/2021   Bilateral pleural effusion 06/05/2021   Hyperammonemia ([place]) 06/05/2021   Superior mesenteric vein thrombosis ([place]) 06/05/2021   Malnutrition of moderate degree 05/31/2021   Acute dyspnea 05/22/2021   Acute on chronic diastolic CHF (congestive heart failure) ([place]) 05/22/2021   Chronic diastolic CHF (congestive heart failure) ([place]) 05/22/2021   Protein-calorie malnutrition, severe 02/28/2021   Iron deficiency anemia 02/20/2021   Obsessive compulsive disorder    Cellulitis of sacral region 12/06/2020   Right pulmonary infiltrate on CXR 10/03/2020   Dysphagia 10/03/2020   GERD without esophagitis 10/03/2020   Aspiration pneumonia ([place]) 10/03/2020   Recurrent right pleural effusion 10/02/2020   Abnormal CT scan, colon    Hx of right BKA ([place]) 09/05/2020   Nausea and vomiting 07/31/2020   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 ([place]) 10/08/2019   Osteomyelitis ([place]) 10/08/2019   Acute osteomyelitis of right ankle or foot (HCC)    Ulcerated, foot, right, with necrosis of bone (HCC)    Pseudomonas aeruginosa infection 09/06/2019   MRSA infection 09/06/2019   Lactic acidosis    Stage 3 skin ulcer of sacral region ([place]) 08/10/2018   Gastric wall thickening 08/10/2018   Alkaline phosphatase elevation 08/10/2018   Cholelithiasis 08/10/2018   Microcytic anemia    Hiatal hernia 04/07/2018   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) 01/20/2016   Functional quadriplegia ([place]) 12/15/2015   Cerebral palsy, quadriplegic  ([place])    Pre-diabetes 08/31/2015   Foot ulcer, limited to breakdown of skin ([place]) 06/08/2015   Constipation 11/01/2014   Seasonal allergies 11/01/2014   Pressure ulcer of foot 03/14/2014   CAP (community acquired pneumonia) 03/13/2014   Neuropathy 01/13/2013   Decubitus ulcer of left hip, stage 3 ([place]) 01/08/2013   URINARY INCONTINENCE 02/09/2010   Intellectual disability 07/24/2006   Infantile cerebral palsy ([place]) 07/24/2006    Palliative Care Assessment & Plan   Patient Profile:    Assessment:  61 year old gentleman from a group home, history of chronic diastolic congestive heart failure, history of right foot amputation iron deficiency anemia anxiety bipolar disorder, OCD, cerebral palsy, chronic bronchitis constipation allergies, history of gastric ulcer history of GI bleed, GERD hiatal hernia dyslipidemia history of pressure ulcer of sacral area, history of Pseudomonas infection and recurrent urinary tract infections.  Patient has functional quadriplegia.  Brought in from his group home due to productive cough, work-up revealed community-acquired pneumonia with concern for possible aspiration as well as large bilateral pleural effusions.  Patient admitted to hospital medicine service, started on antibiotics, also underwent a right thoracenteses and a left thoracenteses. Ongoing issues this hospitalization include concern for recurrent aspiration, speech therapist have been following and they have recommended for only comfort feeds.   Recommendations/Plan: Pain: continue Morphine   infusion. Ok to continue to increase the rate of infusion for comfort purposes, also has bolus IV Morphine available. Now at 8 mg morphine per hour infusion.  Continue comfort care Anticipate hospital death versus consideration for transfer to residential hospice, discussed with sister regarding residential hospice on 1-22 and appreciate TOC follow up.   Continue current Ativan.       Code Status:     Code Status Orders  (From admission, onward)           Start     Ordered   05/22/21  2  Do not attempt resuscitation (DNR)  Continuous       Question Answer Comment  In the event of cardiac or respiratory ARREST Do not call a code blue   In the event of cardiac or respiratory ARREST Do not perform Intubation, CPR, defibrillation or ACLS   In the event of cardiac or respiratory ARREST Use medication by any route, position, wound care, and other measures to relive pain and suffering. May use oxygen, suction and manual treatment of airway obstruction as needed for comfort.      05/22/21 1711           Code Status History     Date Active Date Inactive Code Status Order ID Comments User Context   05/22/2021 1604 05/22/2021 1711 Full Code [id number]  [name], MD ED   02/21/2021 0648 03/03/2021 2347 Full Code [id number]  Ivor Costa, MD ED   02/20/2021 1801 02/21/2021 0648 DNR [id number]  Ivor Costa, MD ED   12/06/2020 2258 12/16/2020 0713 Full Code [id number]  Tacey Ruiz, MD Inpatient   10/03/2020 0055 10/06/2020 2346 DNR [id number]  Vernelle Emerald, MD ED   10/03/2020 0055 10/03/2020 0055 DNR [id number]  Vernelle Emerald, MD ED   09/05/2020 1039 09/07/2020 2311 DNR [id number]  Terrilee Croak, MD Inpatient   09/05/2020 0258 09/05/2020 1038 Full Code [id number]  Dwyane Dee, MD ED   07/31/2020 1758 08/04/2020 0323 Full Code [id number]  Bonnielee Haff, MD ED   10/08/2019 1815 10/09/2019 2042 Full Code [id number]  Cherylann Ratel A, DO Inpatient   04/08/2019 1316 04/12/2019 2132 Full Code [id number]  [name], DO ED   08/10/2018 1642 08/15/2018 1737 DNR [id number]  Annita Brod, MD Inpatient   01/14/2016 0946 01/18/2016 2226 DNR [id number]  Dory Horn, NP Inpatient   01/08/2016 1522 01/14/2016 0946 Full Code [id number]  Smiley Houseman, MD Inpatient   10/19/2015 2054 10/22/2015 1646 DNR [id number]  Aquilla Hacker, MD Inpatient   12/14/2014 1236 12/15/2014 1710 Full Code  [id number]  Newt Minion, MD Inpatient   10/16/2014 1036 10/20/2014 1813 Full Code [id number]  Leone Brand, MD Inpatient   03/13/2014 0323 03/14/2014 2132 Full Code [id number]  Hilton Sinclair, MD Inpatient       Prognosis:  limited to  days   Discharge Planning:   hospital death versus residential hospice.    Care plan was discussed with  IDT  Thank you for allowing the Palliative Medicine Team to assist in the care of this patient.  Loistine Chance, MD  Please contact Palliative Medicine Team phone at [phone] for questions and concerns.

## 2021-06-19 DIAGNOSIS — I509 Heart failure, unspecified: Secondary | ICD-10-CM

## 2021-06-19 NOTE — Progress Notes (Signed)
Daily Progress Note   Patient Name: [name]       Date: 06/19/2021 DOB: [birth year]  Age: 61 y.o. MRN#: [id number] Attending Physician: [place] Eric J, DO Primary Care Physician: Audley Hose, MD Admit Date: 05/22/2021  Reason for Consultation/Follow-up: Establishing goals of care  Subjective: I saw and examined [name] today.  He was lying in bed in no distress.  [name] made eye contact, but he did not respond to questions when asked.   Length of Stay: 27  Current Medications: Scheduled Meds:   chlorhexidine  15 mL Mouth Rinse BID   Chlorhexidine Gluconate Cloth  6 each Topical Daily   clotrimazole  1 application Topical BID   diclofenac Sodium  4 g Topical QID   dicyclomine  20 mg Oral TID AC & HS   hydrocortisone cream  1 application Topical BID   ketoconazole  1 application Topical Once per day on Mon Thu   lactulose  20 g Oral Daily   LORazepam  1 mg Intravenous Q4H   mouth rinse  15 mL Mouth Rinse BID   risperiDONE  2 mg Oral QHS   scopolamine  3 patch Transdermal Q72H    Continuous Infusions:  morphine 8 mg/hr (06/19/21 0523)    PRN Meds: acetaminophen **OR** acetaminophen, albuterol, alum & mag hydroxide-simeth, bisacodyl, bisacodyl, glycopyrrolate, guaiFENesin-dextromethorphan, haloperidol lactate, lip balm, liver oil-zinc oxide, LORazepam, morphine, ondansetron **OR** ondansetron (ZOFRAN) IV, simethicone  Physical Exam           Has contractures Resting in bed S 1 S 2  Regular work of breathing.  No audible secretions noted His abdomen is   distended.     Vital Signs: BP 135/77    Pulse 75    Temp 98.7 F (37.1 C) (Oral)    Resp 20    Ht 6' (1.829 m)    Wt 103.4 kg    SpO2 94%    BMI 30.92 kg/m  SpO2: SpO2: 94 % O2 Device: O2 Device: Room  Air O2 Flow Rate: O2 Flow Rate (L/min): 3 L/min  Intake/output summary:  Intake/Output Summary (Last 24 hours) at 06/19/2021 1341 Last data filed at 06/19/2021 0800 Gross per 24 hour  Intake 750.55 ml  Output 1350 ml  Net -599.45 ml    LBM: Last BM Date: 06/12/21 Baseline  Weight: Weight: 59.8 kg Most recent weight: Weight: 103.4 kg       Palliative Assessment/Data:      Patient Active Problem List   Diagnosis Date Noted   Palliative care by specialist    Portal vein thrombosis 06/05/2021   Bilateral pleural effusion 06/05/2021   Hyperammonemia ([place]) 06/05/2021   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   Malnutrition of moderate degree 05/31/2021   Acute dyspnea 05/22/2021   Acute on chronic diastolic CHF (congestive heart failure) ([place]) 05/22/2021   Chronic diastolic CHF (congestive heart failure) ([place]) 05/22/2021   Protein-calorie malnutrition, severe 02/28/2021   Iron deficiency anemia 02/20/2021   Obsessive compulsive disorder    Cellulitis of sacral region 12/06/2020   Right pulmonary infiltrate on CXR 10/03/2020   Dysphagia 10/03/2020   GERD without esophagitis 10/03/2020   Aspiration pneumonia ([place]) 10/03/2020   Recurrent right pleural effusion 10/02/2020   Abnormal CT scan, colon    Hx of right BKA ([place]) 09/05/2020   Nausea and vomiting 07/31/2020   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 ([place]) 10/08/2019   Osteomyelitis ([place]) 10/08/2019   Acute osteomyelitis of right ankle or foot (HCC)    Ulcerated, foot, right, with necrosis of bone (HCC)    Pseudomonas aeruginosa infection 09/06/2019   MRSA infection 09/06/2019   Lactic acidosis    Stage 3 skin ulcer of sacral region ([place]) 08/10/2018   Gastric wall thickening 08/10/2018   Alkaline phosphatase elevation 08/10/2018   Cholelithiasis 08/10/2018   Microcytic anemia    Hiatal hernia 04/07/2018   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) 01/20/2016   Functional quadriplegia ([place])  12/15/2015   Cerebral palsy, quadriplegic ([place])    Pre-diabetes 08/31/2015   Foot ulcer, limited to breakdown of skin ([place]) 06/08/2015   Constipation 11/01/2014   Seasonal allergies 11/01/2014   Pressure ulcer of foot 03/14/2014   CAP (community acquired pneumonia) 03/13/2014   Neuropathy 01/13/2013   Decubitus ulcer of left hip, stage 3 ([place]) 01/08/2013   URINARY INCONTINENCE 02/09/2010   Intellectual disability 07/24/2006   Infantile cerebral palsy ([place]) 07/24/2006    Palliative Care Assessment & Plan   Assessment:  61 year old gentleman from a group home, history of chronic diastolic congestive heart failure, history of right foot amputation iron deficiency anemia anxiety bipolar disorder, OCD, cerebral palsy, chronic bronchitis constipation allergies, history of gastric ulcer history of GI bleed, GERD hiatal hernia dyslipidemia history of pressure ulcer of sacral area, history of Pseudomonas infection and recurrent urinary tract infections.  Patient has functional quadriplegia.  Brought in from his group home due to productive cough, work-up revealed community-acquired pneumonia with concern for possible aspiration as well as large bilateral pleural effusions.  Patient admitted to hospital medicine service, started on antibiotics, also underwent a right thoracenteses and a left thoracenteses. Ongoing issues this hospitalization include concern for recurrent aspiration, speech therapist have been following and they have recommended for only comfort feeds.   Recommendations/Plan: Pain: Continue morphine infusion.  Currently 8mg  per hour. Ok to continue to increase the rate of infusion for comfort purposes, also has bolus IV Morphine available.  Continue comfort care Anticipate hospital death versus consideration for transfer to residential hospice, discussed with sister regarding residential hospice on 1-22 and appreciate TOC follow up.   Continue current Ativan.      Code Status:     Code Status Orders  (From admission, onward)           Start  Ordered   05/22/21 1709  Do not attempt resuscitation (DNR)  Continuous       Question Answer Comment  In the event of cardiac or respiratory ARREST Do not call a code blue   In the event of cardiac or respiratory ARREST Do not perform Intubation, CPR, defibrillation or ACLS   In the event of cardiac or respiratory ARREST Use medication by any route, position, wound care, and other measures to relive pain and suffering. May use oxygen, suction and manual treatment of airway obstruction as needed for comfort.      05/22/21 1711           Code Status History     Date Active Date Inactive Code Status Order ID Comments User Context   05/22/2021 1604 05/22/2021 1711 Full Code [id number]  [name], MD ED   02/21/2021 0648 03/03/2021 2347 Full Code [id number]  Ivor Costa, MD ED   02/20/2021 1801 02/21/2021 0648 DNR [id number]  Ivor Costa, MD ED   12/06/2020 2258 12/16/2020 0713 Full Code [id number]  Tacey Ruiz, MD Inpatient   10/03/2020 0055 10/06/2020 2346 DNR [id number]  Vernelle Emerald, MD ED   10/03/2020 0055 10/03/2020 0055 DNR [id number]  Vernelle Emerald, MD ED   09/05/2020 1039 09/07/2020 2311 DNR [id number]  Terrilee Croak, MD Inpatient   09/05/2020 0258 09/05/2020 1038 Full Code [id number]  Dwyane Dee, MD ED   07/31/2020 1758 08/04/2020 0323 Full Code [id number]  Bonnielee Haff, MD ED   10/08/2019 1815 10/09/2019 2042 Full Code [id number]  Cherylann Ratel A, DO Inpatient   04/08/2019 1316 04/12/2019 2132 Full Code [id number]  [name], DO ED   08/10/2018 1642 08/15/2018 1737 DNR [id number]  Annita Brod, MD Inpatient   01/14/2016 0946 01/18/2016 2226 DNR [id number]  Dory Horn, NP Inpatient   01/08/2016 1522 01/14/2016 0946 Full Code [id number]  Smiley Houseman, MD Inpatient   10/19/2015 2054 10/22/2015 1646 DNR [id number]  Aquilla Hacker, MD Inpatient   12/14/2014 1236 12/15/2014 1710 Full Code  [id number]  Newt Minion, MD Inpatient   10/16/2014 1036 10/20/2014 1813 Full Code [id number]  Leone Brand, MD Inpatient   03/13/2014 0323 03/14/2014 2132 Full Code [id number]  Hilton Sinclair, MD Inpatient       Prognosis:  limited to  days   Discharge Planning:   hospital death versus residential hospice.    Care plan was discussed with  IDT  Thank you for allowing the Palliative Medicine Team to assist in the care of this patient.  Micheline Rough, MD  Please contact Palliative Medicine Team phone at [phone] for questions and concerns.

## 2021-06-19 NOTE — Progress Notes (Signed)
PROGRESS NOTE    [name]  IHK:[id number] DOB: 16-Jun-1960 DOA: 05/22/2021 PCP: [place], MD    Brief Narrative:  [name] is a 61 year old male with past medical history significant for chronic diastolic congestive heart failure, iron deficiency anemia, anxiety, bipolar disorder, intellectual disability, obsessive-compulsive disorder, cerebral palsy, chronic bronchitis, constipation, history of upper GI bleed secondary to gastric ulcer, GERD, hiatal hernia, hyperlipidemia, osteomyelitis of right foot s/p amputation, sacral decubitus ulcer, recurrent UTIs, vitamin D deficiency, functional quadriplegia who presented to [facility] ED on 12/27 from his group home due to productive cough.  Patient reportedly has trouble sleeping due to the cough with decreased appetite.  In the ED, temperature 99.1 F, HR 106, RR 29, BP 131/73, SPO2 97% on room air.  WBC 12.4, hemoglobin 10.6, platelets 339.  High sensitive troponin within normal limits.  BNP 33.6.  Lactic acid 2.8.  Chest x-ray with cardiomegaly, central pulmonary prominence suggesting CHF, diffuse haziness of both lungs greatest on left with moderate to large bilateral pleural effusion.  Patient was started on azithromycin and ceftriaxone, and IV fluid bolus.  Hospitalist service consulted for further evaluation management.   Assessment & Plan:   Principal Problem:   Acute dyspnea Active Problems:   Infantile cerebral palsy (HCC)   Pressure ulcer of foot   Constipation   Functional quadriplegia (HCC)   Stage 3 skin ulcer of sacral region (HCC)   Microcytic anemia   Lactic acidosis   Hx of right BKA (HCC)   Dysphagia   Aspiration pneumonia (HCC)   Iron deficiency anemia   Protein-calorie malnutrition, severe   Chronic diastolic CHF (congestive heart failure) (HCC)   Malnutrition of moderate degree   Portal vein thrombosis   Bilateral pleural effusion   Hyperammonemia (HCC)   Superior mesenteric vein thrombosis (HCC)    Palliative care by specialist   Community acquired pneumonia Patient initially presented to hospital with productive cough, elevated WBC count.  CT angiogram chest 12/27 with no pulmonary embolism. Completed 5-day course of azithromycin and ceftriaxone.  Bilateral pleural effusion Imaging notable for bilateral pleural effusion.  Patient underwent right-sided thoracentesis by IR on 05/24/2021 and left thoracentesis on 05/25/2021.  Repeat imaging on 06/03/2021 showed persistent pleural effusions and patient underwent left thoracentesis on 06/04/2021. --Now on comfort measures  Portal vein/superior mesenteric vein thrombosis Patient with worsening/persistent abdominal pain.  CT abdomen/pelvis with thrombosis of the main portal and superior mesenteric vein, likely acute with suspected thrombus in the splenic vein.  Was initially started on IV heparin, now discontinued given goals of care transition to comfort measures.  Abdominal pain/constipation: History of chronic constipation, abdominal plain films demonstrated gaseous distention.  Has Foley catheter in place for comfort.  Initially a rectal tube was placed to allow for decompression of the bowel but no appreciable improvements and now discontinued. 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  TSH within normal limits, potassium/magnesium normal.  Was evaluated by cardiology who did not recommend pacemaker and note avoid AV nodal blocking agents.  Patient now transition to comfort measures.  Hyperammonemia Ammonia level was noted be elevated at 90, was started on lactulose, and Depakote was held initially, but checking Depakote level was noted to be subtherapeutic.  Patient received a dose of L-carnitine and follow-up ammonia level improved to 40.  Now on comfort  measures.  Chronic diastolic congestive heart failure Likely etiology of his bilateral pleural effusions.  Now on comfort measures.  Infantile cerebral palsy Functional quadriplegia --Continue muscle relaxants, analgesics, anxiolytics as needed  Obstructive uropathy, chronic --Continue Foley catheter  Goals of care Palliative care consulted and following during hospital course.  Given very poor long-term prognosis, meetings with patient's family with transition to comfort measures.  Pending residential hospice placement.   DVT prophylaxis: Comfort care   Code Status: DNR Family Communication: Updated family present at bedside this morning  Disposition Plan:  Level of care: Med-Surg Status is: Inpatient  Remains inpatient appropriate because: On comfort measures, pending residential hospice placement     Consultants:  Cardiology Palliative care Interventional radiology  Procedures:  Right thoracentesis 12/29 Left thoracentesis 12/30 Left thoracentesis 1/9  Antimicrobials:  Azithromycin 12/27 -12/31 Ceftriaxone 12/27 - 1/1 Cefazolin 1/8 - 1/10   Subjective: Patient seen examined at bedside, sleeping appears more lethargic this morning.  No family present at bedside.  Remains on morphine drip at 8 mg/h.  On comfort measures; pending residential hospice placement versus anticipation of in-hospital death; poor/grim prognosis.  Objective: Vitals:   06/18/21 1430 06/18/21 2041 06/19/21 0621 06/19/21 0920  BP: (!) 121/58 96/72 111/68 135/77  Pulse: 61 76 (!) 56 75  Resp: 16 12 20    Temp: 97.8 F (36.6 C) 98.6 F (37 C) 98 F (36.7 C) 98.7 F (37.1 C)  TempSrc: Oral Oral  Oral  SpO2: 97% 90% 92% 94%  Weight:      Height:        Intake/Output Summary (Last 24 hours) at 06/19/2021 1151 Last data filed at 06/19/2021 0800 Gross per 24 hour  Intake 800.55 ml  Output 1350 ml  Net -549.45 ml   Filed Weights   06/03/21 0500 06/03/21 0628 06/05/21 0500  Weight:  99.8 kg 99.8 kg 103.4 kg    Examination:  General exam: Somnolent, chronically ill in appearance Respiratory system: Breath sounds slightly decreased bilateral bases, normal respiratory effort without accessory muscle use, on room air  Cardiovascular system: S1 & S2 heard, RRR. No JVD, murmurs, rubs, gallops or clicks. No pedal edema. Gastrointestinal system: Abdomen is distended, mild grimace to palpation. No organomegaly or masses felt. Normal bowel sounds heard. Central nervous system: Somnolent.  Extremities: Extremities contracted Skin: sacral decubitus ulcer Psychiatry: Unable to fully assess due to his inability to cooperate with exam/history    Data Reviewed: I have personally reviewed following labs and imaging studies  CBC: No results for input(s): WBC, NEUTROABS, HGB, HCT, MCV, PLT in the last 168 hours. Basic Metabolic Panel: No results for input(s): NA, K, CL, CO2, GLUCOSE, BUN, CREATININE, CALCIUM, MG, PHOS in the last 168 hours. GFR: CrCl cannot be calculated (This lab value cannot be used to calculate CrCl because it is not a number: <0.30). Liver Function Tests: No results for input(s): AST, ALT, ALKPHOS, BILITOT, PROT, ALBUMIN in the last 168 hours. No results for input(s): LIPASE, AMYLASE in the last 168 hours. No results for input(s): AMMONIA in the last 168 hours. Coagulation Profile: No results for input(s): INR, PROTIME in the last 168 hours. Cardiac Enzymes: No results for input(s): CKTOTAL, CKMB, CKMBINDEX, TROPONINI in the last 168 hours. BNP (last 3 results) No results for input(s): PROBNP in the last 8760 hours. HbA1C: No results for input(s): HGBA1C in the last 72 hours. CBG: No results for input(s): GLUCAP in the last 168 hours. Lipid Profile: No results for input(s): CHOL, HDL, LDLCALC,  TRIG, CHOLHDL, LDLDIRECT in the last 72 hours. Thyroid Function Tests: No results for input(s): TSH, T4TOTAL, FREET4, T3FREE, THYROIDAB in the last 72  hours. Anemia Panel: No results for input(s): VITAMINB12, FOLATE, FERRITIN, TIBC, IRON, RETICCTPCT in the last 72 hours. Sepsis Labs: No results for input(s): PROCALCITON, LATICACIDVEN in the last 168 hours.  No results found for this or any previous visit (from the past 240 hour(s)).       Radiology Studies: No results found.      Scheduled Meds:  chlorhexidine  15 mL Mouth Rinse BID   Chlorhexidine Gluconate Cloth  6 each Topical Daily   clotrimazole  1 application Topical BID   diclofenac Sodium  4 g Topical QID   dicyclomine  20 mg Oral TID AC & HS   hydrocortisone cream  1 application Topical BID   ketoconazole  1 application Topical Once per day on Mon Thu   lactulose  20 g Oral Daily   LORazepam  1 mg Intravenous Q4H   mouth rinse  15 mL Mouth Rinse BID   risperiDONE  2 mg Oral QHS   scopolamine  3 patch Transdermal Q72H   Continuous Infusions:  morphine 8 mg/hr (06/19/21 0523)     LOS: 27 days    Time spent: 36 minutes spent on chart review, discussion with nursing staff, consultants, updating family and interview/physical exam; more than 50% of that time was spent in counseling and/or coordination of care.    Alvira Philips [place], DO [facility] Available via Epic secure chat 7am-7pm After these hours, please refer to coverage provider listed on [email or web address] 06/19/2021, 11:51 AM

## 2021-06-20 NOTE — TOC Progression Note (Addendum)
Transition of Care [facility]) - Progression Note    Patient Details  Name: [name] MRN: [id number] Date of Birth: 1960-09-27  [facility]) CM/SW Contact  Purcell Mouton, RN Phone Number: 06/20/2021, 12:32 PM  Clinical Narrative:    Spoke with pt's RN who states that pt ate only 10% of a cup of applesauce. Spoke with pt's sister [name], who agreed with pt going to [place].     Expected Discharge Plan: [place] Barriers to Discharge: No Barriers Identified  Expected Discharge Plan and Services Expected Discharge Plan: [place]       Living arrangements for the past 2 months:  (Group home)                                       Social Determinants of Health (SDOH) Interventions    Readmission Risk Interventions Readmission Risk Prevention Plan 08/03/2020  Post Dischage Appt Complete  Medication Screening Complete  Some recent data might be hidden

## 2021-06-20 NOTE — Progress Notes (Signed)
Daily Progress Note   Patient Name: [name]       Date: 06/20/2021 DOB: 1961-04-29  Age: 61 y.o. MRN#: [id number] Attending Physician: Donne Hazel, MD Primary Care Physician: Audley Hose, MD Admit Date: 05/22/2021  Reason for Consultation/Follow-up: Establishing goals of care  Subjective: I saw and examined [name] today.  He was a little more interactive with me today but reports he is in more pain.  I discussed with care team and he is limited in his intake.  Noted to have eaten 10% this morning.  Medications reviewed.  He is still utilizing continuous infusion of morphine to control his pain.   Length of Stay: 28  Current Medications: Scheduled Meds:   chlorhexidine  15 mL Mouth Rinse BID   Chlorhexidine Gluconate Cloth  6 each Topical Daily   clotrimazole  1 application Topical BID   diclofenac Sodium  4 g Topical QID   dicyclomine  20 mg Oral TID AC & HS   hydrocortisone cream  1 application Topical BID   ketoconazole  1 application Topical Once per day on Mon Thu   lactulose  20 g Oral Daily   LORazepam  1 mg Intravenous Q4H   mouth rinse  15 mL Mouth Rinse BID   risperiDONE  2 mg Oral QHS   scopolamine  3 patch Transdermal Q72H    Continuous Infusions:  morphine 8 mg/hr (06/20/21 1429)    PRN Meds: acetaminophen **OR** acetaminophen, albuterol, alum & mag hydroxide-simeth, bisacodyl, bisacodyl, glycopyrrolate, guaiFENesin-dextromethorphan, haloperidol lactate, lip balm, liver oil-zinc oxide, LORazepam, morphine, ondansetron **OR** ondansetron (ZOFRAN) IV, simethicone  Physical Exam           Has contractures Resting in bed S 1 S 2  Regular work of breathing.  No audible secretions noted His abdomen is   distended.     Vital Signs: BP (!)  157/108 (BP Location: Right Arm)    Pulse (!) 108    Temp 97.7 F (36.5 C) (Oral)    Resp (!) 22    Ht 6' (1.829 m)    Wt 103.4 kg    SpO2 91%    BMI 30.92 kg/m  SpO2: SpO2: 91 % O2 Device: O2 Device: Room Air O2 Flow Rate: O2 Flow Rate (L/min): 3 L/min  Intake/output summary:  Intake/Output  Summary (Last 24 hours) at 06/20/2021 1723 Last data filed at 06/20/2021 1301 Gross per 24 hour  Intake 1090.99 ml  Output 1400 ml  Net -309.01 ml    LBM: Last BM Date: 06/12/21 Baseline Weight: Weight: 59.8 kg Most recent weight: Weight: 103.4 kg       Palliative Assessment/Data:      Patient Active Problem List   Diagnosis Date Noted   Palliative care by specialist    Portal vein thrombosis 06/05/2021   Bilateral pleural effusion 06/05/2021   Hyperammonemia ([place]) 06/05/2021   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   Malnutrition of moderate degree 05/31/2021   Acute dyspnea 05/22/2021   Acute on chronic diastolic CHF (congestive heart failure) ([place]) 05/22/2021   Chronic diastolic CHF (congestive heart failure) ([place]) 05/22/2021   Protein-calorie malnutrition, severe 02/28/2021   Iron deficiency anemia 02/20/2021   Obsessive compulsive disorder    Cellulitis of sacral region 12/06/2020   Right pulmonary infiltrate on CXR 10/03/2020   Dysphagia 10/03/2020   GERD without esophagitis 10/03/2020   Aspiration pneumonia ([place]) 10/03/2020   Recurrent right pleural effusion 10/02/2020   Abnormal CT scan, colon    Hx of right BKA ([place]) 09/05/2020   Nausea and vomiting 07/31/2020   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 ([place]) 10/08/2019   Osteomyelitis ([place]) 10/08/2019   Acute osteomyelitis of right ankle or foot (HCC)    Ulcerated, foot, right, with necrosis of bone (HCC)    Pseudomonas aeruginosa infection 09/06/2019   MRSA infection 09/06/2019   Lactic acidosis    Stage 3 skin ulcer of sacral region ([place]) 08/10/2018   Gastric wall thickening 08/10/2018   Alkaline  phosphatase elevation 08/10/2018   Cholelithiasis 08/10/2018   Microcytic anemia    Hiatal hernia 04/07/2018   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) 01/20/2016   Functional quadriplegia ([place]) 12/15/2015   Cerebral palsy, quadriplegic ([place])    Pre-diabetes 08/31/2015   Foot ulcer, limited to breakdown of skin ([place]) 06/08/2015   Constipation 11/01/2014   Seasonal allergies 11/01/2014   Pressure ulcer of foot 03/14/2014   CAP (community acquired pneumonia) 03/13/2014   Neuropathy 01/13/2013   Decubitus ulcer of left hip, stage 3 ([place]) 01/08/2013   URINARY INCONTINENCE 02/09/2010   Intellectual disability 07/24/2006   Infantile cerebral palsy ([place]) 07/24/2006    Palliative Care Assessment & Plan   Assessment:  61 year old gentleman from a group home, history of chronic diastolic congestive heart failure, history of right foot amputation iron deficiency anemia anxiety bipolar disorder, OCD, cerebral palsy, chronic bronchitis constipation allergies, history of gastric ulcer history of GI bleed, GERD hiatal hernia dyslipidemia history of pressure ulcer of sacral area, history of Pseudomonas infection and recurrent urinary tract infections.  Patient has functional quadriplegia.  Brought in from his group home due to productive cough, work-up revealed community-acquired pneumonia with concern for possible aspiration as well as large bilateral pleural effusions.  Patient admitted to hospital medicine service, started on antibiotics, also underwent a right thoracenteses and a left thoracenteses. Ongoing issues this hospitalization include concern for recurrent aspiration, speech therapist have been following and they have recommended for only comfort feeds.   Recommendations/Plan: Pain: Continue morphine infusion currently 8mg  per hour. Ok to continue to increase the rate of infusion for comfort purposes, also has bolus IV Morphine available.  Continue comfort care Recommendation  for transfer to residential hospice, discussed with sister regarding residential hospice on 1-22 and appreciate TOC follow up.   Continue  current Ativan.      Code Status:    Code Status Orders  (From admission, onward)           Start     Ordered   05/22/21 1709  Do not attempt resuscitation (DNR)  Continuous       Question Answer Comment  In the event of cardiac or respiratory ARREST Do not call a code blue   In the event of cardiac or respiratory ARREST Do not perform Intubation, CPR, defibrillation or ACLS   In the event of cardiac or respiratory ARREST Use medication by any route, position, wound care, and other measures to relive pain and suffering. May use oxygen, suction and manual treatment of airway obstruction as needed for comfort.      05/22/21 1711           Code Status History     Date Active Date Inactive Code Status Order ID Comments User Context   05/22/2021 1604 05/22/2021 1711 Full Code [id number]  [name], MD ED   02/21/2021 0648 03/03/2021 2347 Full Code [id number]  Ivor Costa, MD ED   02/20/2021 1801 02/21/2021 0648 DNR [id number]  Ivor Costa, MD ED   12/06/2020 2258 12/16/2020 0713 Full Code [id number]  Tacey Ruiz, MD Inpatient   10/03/2020 0055 10/06/2020 2346 DNR [id number]  Vernelle Emerald, MD ED   10/03/2020 0055 10/03/2020 0055 DNR [id number]  Vernelle Emerald, MD ED   09/05/2020 1039 09/07/2020 2311 DNR [id number]  Terrilee Croak, MD Inpatient   09/05/2020 0258 09/05/2020 1038 Full Code [id number]  Dwyane Dee, MD ED   07/31/2020 1758 08/04/2020 0323 Full Code [id number]  Bonnielee Haff, MD ED   10/08/2019 1815 10/09/2019 2042 Full Code [id number]  Cherylann Ratel A, DO Inpatient   04/08/2019 1316 04/12/2019 2132 Full Code [id number]  [name], DO ED   08/10/2018 1642 08/15/2018 1737 DNR [id number]  Annita Brod, MD Inpatient   01/14/2016 0946 01/18/2016 2226 DNR [id number]  Dory Horn, NP Inpatient   01/08/2016 1522 01/14/2016 0946  Full Code [id number]  Smiley Houseman, MD Inpatient   10/19/2015 2054 10/22/2015 1646 DNR [id number]  Aquilla Hacker, MD Inpatient   12/14/2014 1236 12/15/2014 1710 Full Code [id number]  Newt Minion, MD Inpatient   10/16/2014 1036 10/20/2014 1813 Full Code [id number]  Leone Brand, MD Inpatient   03/13/2014 0323 03/14/2014 2132 Full Code [id number]  Hilton Sinclair, MD Inpatient       Prognosis: Likely days to limited number of weeks  Discharge Planning: Recommendation for residential hospice.    Care plan was discussed with  IDT  Thank you for allowing the Palliative Medicine Team to assist in the care of this patient.  Micheline Rough, MD  Please contact Palliative Medicine Team phone at [phone] for questions and concerns.

## 2021-06-20 NOTE — Progress Notes (Signed)
PROGRESS NOTE    [name]  [id number] DOB: Mar 16, 1961 DOA: 05/22/2021 PCP: Audley Hose, MD    Brief Narrative:  61 year old male with past medical history significant for chronic diastolic congestive heart failure, iron deficiency anemia, anxiety, bipolar disorder, intellectual disability, obsessive-compulsive disorder, cerebral palsy, chronic bronchitis, constipation, history of upper GI bleed secondary to gastric ulcer, GERD, hiatal hernia, hyperlipidemia, osteomyelitis of right foot s/p amputation, sacral decubitus ulcer, recurrent UTIs, vitamin D deficiency, functional quadriplegia who presented to [facility] ED on 12/27 from his group home due to productive cough.  Patient reportedly has trouble sleeping due to the cough with decreased appetite.   In the ED, temperature 99.1 F, HR 106, RR 29, BP 131/73, SPO2 97% on room air.  WBC 12.4, hemoglobin 10.6, platelets 339.  High sensitive troponin within normal limits.  BNP 33.6.  Lactic acid 2.8.  Chest x-ray with cardiomegaly, central pulmonary prominence suggesting CHF, diffuse haziness of both lungs greatest on left with moderate to large bilateral pleural effusion.  Patient was started on azithromycin and ceftriaxone, and IV fluid bolus.  Hospitalist service consulted for further evaluation management.  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  CT angiogram chest 12/27 with no pulmonary embolism. Completed 5-day course of azithromycin and ceftriaxone.   Bilateral pleural effusion Imaging notable for bilateral pleural effusion.  Patient underwent right-sided thoracentesis by IR on 05/24/2021 and left thoracentesis on 05/25/2021.  Repeat imaging on 06/03/2021 showed persistent pleural effusions and patient underwent left thoracentesis on 06/04/2021. --Cont with comfort measures per below   Portal vein/superior mesenteric vein thrombosis Patient with worsening/persistent abdominal pain.  CT abdomen/pelvis with thrombosis of the main portal and superior mesenteric vein, likely acute with suspected thrombus in the splenic vein.  Was initially started on IV heparin, now discontinued given goals of care transition to comfort measures.   Abdominal pain/constipation: History of chronic constipation, abdominal plain films demonstrated gaseous distention.  Has Foley catheter in place for comfort.  Initially a rectal tube was placed to allow for decompression of the bowel but no appreciable improvements and now discontinued. 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  TSH within normal limits, potassium/magnesium normal.  Was evaluated by cardiology who did not recommend pacemaker and note avoid AV nodal blocking agents.  Patient now transition to comfort measures.   Hyperammonemia Ammonia level was noted be elevated at 90, was started on lactulose, and Depakote was held initially, but checking Depakote level was noted to be subtherapeutic.  Patient received a dose of L-carnitine and follow-up ammonia level improved to 40.  Cont on comfort measures Staff  reports pt barely eats even 10% meals   Chronic diastolic congestive heart failure Likely etiology of his bilateral pleural effusions.  Now on comfort measures.   Infantile cerebral palsy Functional quadriplegia --Continue muscle relaxants, analgesics, anxiolytics as needed   Obstructive uropathy, chronic --Continue Foley catheter   Goals of care Palliative care consulted and following during hospital course.  Given very poor long-term prognosis, meetings with patient's family with transition to comfort measures.   Likely residential hospice   DVT prophylaxis: Comfort measures Code Status: DNR Family Communication: Pt in room, family not at bedside  Status is: Inpatient  Remains inpatient appropriate because: Severity of illness    Consultants:  Cardiology Palliative care Interventional radiology  Procedures:  Right thoracentesis 12/29 Left thoracentesis 12/30 Left thoracentesis 1/9  Antimicrobials: Anti-infectives (From admission, onward)    Start     Dose/Rate Route Frequency Ordered Stop   06/03/21 2200  ceFAZolin (ANCEF) IVPB 2g/100 mL premix  Status:  Discontinued        2 g 200 mL/hr over 30 Minutes Intravenous Every 8 hours 06/03/21 2051 06/05/21 1641   05/24/21 1200  azithromycin (ZITHROMAX) tablet 500 mg        500 mg Oral Daily 05/24/21 1054 05/26/21 1047   05/23/21 1500  cefTRIAXone (ROCEPHIN) 1 g in sodium chloride 0.9 % 100 mL IVPB        1 g 200 mL/hr over 30 Minutes Intravenous Every 24 hours 05/22/21 1603 05/27/21 1620   05/23/21 1500  azithromycin (ZITHROMAX) 500 mg in sodium chloride 0.9 % 250 mL IVPB  Status:  Discontinued        500 mg 250 mL/hr over 60 Minutes Intravenous Every 24 hours 05/22/21 1603 05/24/21 1054   05/22/21 1530  cefTRIAXone (ROCEPHIN) 1 g in sodium chloride 0.9 % 100 mL IVPB        1 g 200 mL/hr over 30 Minutes Intravenous  Once 05/22/21 1519 05/22/21 1600   05/22/21 1530  azithromycin (ZITHROMAX) 500 mg in sodium chloride  0.9 % 250 mL IVPB        500 mg 250 mL/hr over 60 Minutes Intravenous  Once 05/22/21 1519 05/22/21 1740       Subjective: Without complaints  Objective: Vitals:   06/19/21 0621 06/19/21 0920 06/19/21 2311 06/20/21 1259  BP: 111/68 135/77 109/79 (!) 157/108  Pulse: (!) 56 75 62 (!) 108  Resp: 20  16 (!) 22  Temp: 98 F (36.7 C) 98.7 F (37.1 C) 97.8 F (36.6 C) 97.7 F (36.5 C)  TempSrc:  Oral Oral Oral  SpO2: 92% 94% 91% 91%  Weight:      Height:        Intake/Output Summary (Last 24 hours) at 06/20/2021 1551 Last data filed at 06/20/2021 1301 Gross per 24 hour  Intake 1090.99 ml  Output 1400 ml  Net -309.01 ml   Filed Weights   06/03/21 0500 06/03/21 0628 06/05/21 0500  Weight: 99.8 kg 99.8 kg 103.4 kg    Examination: General exam: Awake, laying in bed, in nad Respiratory system: Normal respiratory effort, no wheezing Cardiovascular system: regular rate, s1, s2 Gastrointestinal system: Soft, nondistended, positive BS Central nervous system: CN2-12 grossly intact, strength intact Extremities: Perfused, no clubbing Skin: Normal skin turgor, no notable skin lesions seen Psychiatry: Difficult to assess  Data Reviewed: I have personally reviewed following labs and imaging studies  CBC: No results for input(s): WBC, NEUTROABS, HGB, HCT, MCV, PLT in the last 168 hours. Basic Metabolic Panel:  No results for input(s): NA, K, CL, CO2, GLUCOSE, BUN, CREATININE, CALCIUM, MG, PHOS in the last 168 hours. GFR: CrCl cannot be calculated (This lab value cannot be used to calculate CrCl because it is not a number: <0.30). Liver Function Tests: No results for input(s): AST, ALT, ALKPHOS, BILITOT, PROT, ALBUMIN in the last 168 hours. No results for input(s): LIPASE, AMYLASE in the last 168 hours. No results for input(s): AMMONIA in the last 168 hours. Coagulation Profile: No results for input(s): INR, PROTIME in the last 168 hours. Cardiac Enzymes: No results for input(s):  CKTOTAL, CKMB, CKMBINDEX, TROPONINI in the last 168 hours. BNP (last 3 results) No results for input(s): PROBNP in the last 8760 hours. HbA1C: No results for input(s): HGBA1C in the last 72 hours. CBG: No results for input(s): GLUCAP in the last 168 hours. Lipid Profile: No results for input(s): CHOL, HDL, LDLCALC, TRIG, CHOLHDL, LDLDIRECT in the last 72 hours. Thyroid Function Tests: No results for input(s): TSH, T4TOTAL, FREET4, T3FREE, THYROIDAB in the last 72 hours. Anemia Panel: No results for input(s): VITAMINB12, FOLATE, FERRITIN, TIBC, IRON, RETICCTPCT in the last 72 hours. Sepsis Labs: No results for input(s): PROCALCITON, LATICACIDVEN in the last 168 hours.  No results found for this or any previous visit (from the past 240 hour(s)).   Radiology Studies: No results found.  Scheduled Meds:  chlorhexidine  15 mL Mouth Rinse BID   Chlorhexidine Gluconate Cloth  6 each Topical Daily   clotrimazole  1 application Topical BID   diclofenac Sodium  4 g Topical QID   dicyclomine  20 mg Oral TID AC & HS   hydrocortisone cream  1 application Topical BID   ketoconazole  1 application Topical Once per day on Mon Thu   lactulose  20 g Oral Daily   LORazepam  1 mg Intravenous Q4H   mouth rinse  15 mL Mouth Rinse BID   risperiDONE  2 mg Oral QHS   scopolamine  3 patch Transdermal Q72H   Continuous Infusions:  morphine 8 mg/hr (06/20/21 1429)     LOS: 28 days   Marylu Lund, MD [facility] Pager On Amion  If 7PM-7AM, please contact night-coverage 06/20/2021, 3:51 PM

## 2021-06-20 NOTE — Plan of Care (Signed)
  Problem: Coping: Goal: Level of anxiety will decrease Outcome: Progressing   Problem: Pain Managment: Goal: General experience of comfort will improve Outcome: Progressing   

## 2021-06-21 LAB — RESP PANEL BY RT-PCR (FLU A&B, COVID) ARPGX2
Influenza A by PCR: NEGATIVE
Influenza B by PCR: NEGATIVE
SARS Coronavirus 2 by RT PCR: NEGATIVE

## 2021-06-21 MED ORDER — LORAZEPAM 2 MG/ML PO CONC: 2.0000 mg | ORAL | 0 refills | Status: AC | PRN

## 2021-06-21 MED ORDER — MORPHINE SULFATE (CONCENTRATE) 10 MG /0.5 ML PO SOLN: 10.0000 mg | ORAL | 0 refills | Status: AC | PRN

## 2021-06-21 MED ORDER — SIMETHICONE 40 MG/0.6ML PO SUSP: 80.0000 mg | Freq: Four times a day (QID) | ORAL | 0 refills | Status: AC | PRN

## 2021-06-21 MED ORDER — SCOPOLAMINE 1 MG/3DAYS TD PT72: 3.0000 | MEDICATED_PATCH | TRANSDERMAL | 0 refills | Status: AC

## 2021-06-21 NOTE — TOC Progression Note (Signed)
Transition of Care [facility]) - Progression Note    Patient Details  Name: [name] MRN: [id number] Date of Birth: Nov 28, 1960  Transition of [facility]) CM/SW Contact  Geni Bers, RN Phone Number: 06/21/2021, 1:42 PM  Clinical Narrative:    Pt was placed on Will call list with [facility]. Waiting for rapid COVID test.    Expected Discharge Plan: Hospice Medical Facility Barriers to Discharge: No Barriers Identified  Expected Discharge Plan and Services Expected Discharge Plan: Hospice Medical Facility       Living arrangements for the past 2 months:  (Group home) Expected Discharge Date: 06/21/21                                     Social Determinants of Health (SDOH) Interventions    Readmission Risk Interventions Readmission Risk Prevention Plan 08/03/2020  Post Dischage Appt Complete  Medication Screening Complete  Some recent data might be hidden

## 2021-06-21 NOTE — Progress Notes (Signed)
Pt discharge at this time via EMS in no acute episode. Alert and oriented. VS stable. Scheduled ativan given.

## 2021-06-21 NOTE — Plan of Care (Signed)
  Problem: Nutrition: Goal: Adequate nutrition will be maintained Outcome: Progressing   Problem: Coping: Goal: Level of anxiety will decrease Outcome: Progressing   Problem: Pain Managment: Goal: General experience of comfort will improve Outcome: Progressing   

## 2021-06-21 NOTE — Discharge Summary (Signed)
Physician Discharge Summary  [name] [id number] DOB: [birth year] DOA: 05/22/2021  PCP: Audley Hose, MD  Admit date: 05/22/2021 Discharge date: 06/21/2021  Admitted From: Group Home Disposition:  Residential Hospice  Recommendations for Outpatient Follow-up:  Follow up with PCP as needed  Discharge Condition:Stable CODE STATUS:DNR, comfort Diet recommendation: Regular, comfort   Brief/Interim Summary: 61 year old male with past medical history significant for chronic diastolic congestive heart failure, iron deficiency anemia, anxiety, bipolar disorder, intellectual disability, obsessive-compulsive disorder, cerebral palsy, chronic bronchitis, constipation, history of upper GI bleed secondary to gastric ulcer, GERD, hiatal hernia, hyperlipidemia, osteomyelitis of right foot s/p amputation, sacral decubitus ulcer, recurrent UTIs, vitamin D deficiency, functional quadriplegia who presented to [facility] ED on 12/27 from his group home due to productive cough.  Patient reportedly has trouble sleeping due to the cough with decreased appetite.   In the ED, temperature 99.1 F, HR 106, RR 29, BP 131/73, SPO2 97% on room air.  WBC 12.4, hemoglobin 10.6, platelets 339.  High sensitive troponin within normal limits.  BNP 33.6.  Lactic acid 2.8.  Chest x-ray with cardiomegaly, central pulmonary prominence suggesting CHF, diffuse haziness of both lungs greatest on left with moderate to large bilateral pleural effusion.  Patient was started on azithromycin and ceftriaxone, and IV fluid bolus.  Hospitalist service consulted for further evaluation management.   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  CT angiogram chest 12/27 with no pulmonary embolism. Completed 5-day course of azithromycin and ceftriaxone.   Bilateral pleural effusion Imaging notable for bilateral pleural effusion.  Patient underwent right-sided thoracentesis by IR on 05/24/2021 and left thoracentesis on 05/25/2021.  Repeat imaging on 06/03/2021 showed persistent pleural effusions and patient underwent left thoracentesis on 06/04/2021. --Cont with comfort measures per below   Portal vein/superior mesenteric vein thrombosis Patient with worsening/persistent abdominal pain.  CT abdomen/pelvis with thrombosis of the main portal and superior mesenteric vein, likely acute with suspected thrombus in the splenic vein.  Was initially started on IV heparin, now discontinued given goals of care transition to comfort measures.   Abdominal pain/constipation: History of chronic constipation, abdominal plain films demonstrated gaseous distention.  Has Foley catheter in place for comfort.  Initially a rectal tube was placed to allow for decompression of the bowel but no appreciable improvements and now discontinued. 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  TSH within normal limits, potassium/magnesium normal.  Was evaluated by cardiology who did not recommend pacemaker and note avoid AV nodal blocking agents.  Patient now transition to  comfort measures.   Hyperammonemia Ammonia level was noted be elevated at  90, was started on lactulose, and Depakote was held initially, but checking Depakote level was noted to be subtherapeutic.  Patient received a dose of L-carnitine and follow-up ammonia level improved to 40.  Cont on comfort measures Staff reports pt barely eats even 10% meals   Chronic diastolic congestive heart failure Likely etiology of his bilateral pleural effusions.  Now on comfort measures.   Infantile cerebral palsy Functional quadriplegia --Continue muscle relaxants, analgesics, anxiolytics as needed   Obstructive uropathy, chronic --Continue Foley catheter   Goals of care Palliative care consulted and following during hospital course.  Given very poor long-term prognosis, meetings with patient's family with transition to comfort measures.   Plan d/c to residential hospice    Discharge Instructions   Allergies as of 06/21/2021       Reactions   Adhesive [tape] Rash        Medication List     STOP taking these medications    baclofen 20 MG tablet Commonly known as: LIORESAL   brompheniramine-pseudoephedrine 1-15 MG/5ML Elix Commonly known as: DIMETAPP   cetirizine 10 MG tablet Commonly known as: ZYRTEC   divalproex 125 MG capsule Commonly known as: DEPAKOTE SPRINKLE   Ensure Plus Liqd   Ferrex 150 150 MG capsule Generic drug: iron polysaccharides   fluticasone 50 MCG/ACT nasal spray Commonly known as: FLONASE   HYDROcodone-acetaminophen 5-325 MG tablet Commonly known as: NORCO/VICODIN   LORazepam 2 MG tablet Commonly known as: ATIVAN Replaced by: LORazepam 2 MG/ML concentrated solution   neomycin-bacitracin-polymyxin [phone] ointment   omeprazole 40 MG capsule Commonly known as: PRILOSEC   Oyster Shell 500 MG Tabs   Prevalon Heel Protector Misc   Santyl ointment Generic drug: collagenase   senna-docusate 8.6-50 MG tablet Commonly known as: Senokot-S    sodium hypochlorite 0.125 % Soln Commonly known as: DAKIN'S 1/4 STRENGTH   tolterodine 2 MG 24 hr capsule Commonly known as: DETROL LA   tretinoin 0.025 % gel Commonly known as: RETIN-A   vitamin C 250 MG tablet Commonly known as: ASCORBIC ACID   Vitamin D 125 MCG (5000 UT) Caps       TAKE these medications    acetaminophen 500 MG tablet Commonly known as: TYLENOL Take 500 mg by mouth every 6 (six) hours as needed (for aches, pains, and/or fevers).   albuterol 108 (90 Base) MCG/ACT inhaler Commonly known as: VENTOLIN HFA Inhale 2 puffs into the lungs every 4 (four) hours as needed for shortness of breath or wheezing.   alum & mag hydroxide-simeth 200-200-20 MG/5ML suspension Commonly known as: MAALOX/MYLANTA Take 15-30 mLs by mouth every 6 (six) hours as needed for indigestion or heartburn (or nausea).   bisacodyl 10 MG suppository Commonly known as: DULCOLAX Place 10 mg rectally every other day.   bisacodyl 5 MG EC tablet Commonly known as: DULCOLAX Take 1 tablet (5 mg total) by mouth daily as needed for moderate constipation.   clotrimazole 1 % cream Commonly known as: LOTRIMIN Apply 1 application topically 2 (two) times daily.   diclofenac Sodium 1 % Gel Commonly known as: Voltaren Apply 4 g topically See admin instructions. Apply 4 grams to painful areas four times a day What changed:  when to take this additional instructions   guaiFENesin-dextromethorphan 100-10 MG/5ML syrup Commonly known as: ROBITUSSIN DM Take 10 mLs by mouth every 4 (four) hours as needed for cough.   hydrocortisone cream 1 % Apply 1 application topically 2 (two) times daily. What changed: Another medication with the same  name was removed. Continue taking this medication, and follow the directions you see here.   ketoconazole 2 % shampoo Commonly known as: NIZORAL Apply 1 application topically 2 (two) times a week.   lactulose 10 GM/15ML solution Commonly known as:  CHRONULAC Take 15 mLs (10 g total) by mouth daily. What changed: how much to take   Desitin 40 % Pste Generic drug: Zinc Oxide Apply 1 application topically daily as needed (to buttocks and scrotum). What changed: Another medication with the same name was changed. Make sure you understand how and when to take each.   zinc oxide 20 % ointment Apply 1 application topically daily as needed for irritation (for maceration to periwound with each dressing change). What changed: Another medication with the same name was changed. Make sure you understand how and when to take each.   liver oil-zinc oxide 40 % ointment Commonly known as: DESITIN Apply 1 application topically 2 (two) times daily as needed (unspecified). What changed: reasons to take this   LORazepam 2 MG/ML concentrated solution Commonly known as: ATIVAN Take 1 mL (2 mg total) by mouth every 4 (four) hours as needed for anxiety. Replaces: LORazepam 2 MG tablet   morphine CONCENTRATE 10 mg / 0.5 ml concentrated solution Place 0.5 mLs (10 mg total) under the tongue every 4 (four) hours as needed for severe pain or shortness of breath.   ondansetron 4 MG tablet Commonly known as: ZOFRAN Take 1 tablet (4 mg total) by mouth every 6 (six) hours as needed for nausea.   polyethylene glycol powder 17 GM/SCOOP powder Commonly known as: GLYCOLAX/MIRALAX Take 17 g by mouth 2 (two) times daily. Mix 17 grams in 8 oz of beverage of choice and take by mouth.   risperiDONE 2 MG tablet Commonly known as: RISPERDAL Take 2 mg by mouth at bedtime.   scopolamine 1 MG/3DAYS Commonly known as: TRANSDERM-SCOP Place 3 patches (4.5 mg total) onto the skin every 3 (three) days.   simethicone 40 MG/0.6ML drops Commonly known as: MYLICON Take 1.2 mLs (80 mg total) by mouth 4 (four) times daily as needed for flatulence.        Follow-up Information     Bakare, Larey Dresser, MD Follow up.   Specialty: Internal Medicine Why: As needed Contact  information: [place] [address] [phone]         Corey Skains, MD .   Specialty: Cardiology Contact information: [address] [facility] [place] [address] [phone]                Allergies  Allergen Reactions   Adhesive [Tape] Rash    Consultations: Palliative Care  Procedures/Studies: DG Chest 1 View  Result Date: 06/04/2021 CLINICAL DATA:  Post left thoracentesis EXAM: CHEST  1 VIEW COMPARISON:  05/30/2021, CT 06/03/2021 FINDINGS: Low lung volumes. Mild cardiomegaly. Improved aeration on the left with decreased pleural effusion, small residual. No pneumothorax is seen. Patchy airspace disease at left lung base and within the right thorax. IMPRESSION: 1. Improved aeration on the left with decreased pleural effusion, no pneumothorax. 2. Residual bilateral effusions with patchy airspace disease in right thorax and at left base, suggestive of pneumonia. Electronically Signed   By: [name] Foil M.D.   On: 06/04/2021 15:48   DG Abd 1 View  Result Date: 06/10/2021 CLINICAL DATA:  Abdominal pain. EXAM: ABDOMEN - 1 VIEW COMPARISON:  05/31/2021. FINDINGS: Partly gas-filled small bowel and colon noted throughout  the abdomen, with no significant bowel dilation to suggest obstruction. Stable surgical vascular clips are noted at the epigastrium. Soft tissues are poorly defined. IMPRESSION: 1. Overall increased bowel gas, but no bowel dilation to suggest obstruction. Findings similar to the prior exam. Electronically Signed   By: Lajean Manes M.D.   On: 06/10/2021 15:29   CT Angio Chest Pulmonary Embolism (PE) W or WO Contrast  Result Date: 05/22/2021 CLINICAL DATA:  Coughing and shortness of breath. EXAM: CT ANGIOGRAPHY CHEST WITH CONTRAST TECHNIQUE: Multidetector CT imaging of the chest was performed using the standard protocol during bolus administration of intravenous contrast. Multiplanar CT image  reconstructions and MIPs were obtained to evaluate the vascular anatomy. CONTRAST:  1106mL OMNIPAQUE IOHEXOL 350 MG/ML SOLN COMPARISON:  Portable chest today, portable chest 02/20/2021, CTA chest 10/22/2020, CTA chest 09/05/2020 FINDINGS: Cardiovascular: Slightly prominent pulmonary trunk 3.1 cm. The pulmonary arteries are free of filling defects at least through the segmental divisions. The subsegmental arteries are obscured due to breathing motion artifact and atelectasis related to bilateral pleural effusions. The cardiac size is normal. There is a small anterior pericardial effusion which is increased. Scattered calcification LAD and circumflex coronary arteries. There is aortic tortuosity with minimal atherosclerosis, normal caliber aorta and great vessels no dissection , no significant venous distention. Mediastinum/Nodes: Moderate-to-large size hiatal hernia with surgical clips posterior to the hernia sac. Contrast is again noted in the posterior aspect of the distal esophagus/hernia. No intrathoracic or axillary adenopathy is seen. No visible thyroid mass. There is no tracheal filling defect. Lungs/Pleura: There is a large left and moderate to large right layering pleural effusions both increased over prior studies. Left pleural effusion completely collapses the left lower lobe and much of the posterior left upper lobe. The right pleural effusion partially collapses the lower lobe. The mediastinum again noted shifted to the right. Chronic micronodular disease again noted laterally in the right middle lobe. There are scattered atelectatic bands in the right middle lobe. Underlying pneumonia would be difficult to exclude in the collapsed portions of the lungs. Central airways are small in caliber which could be from respiratory phase or bronchospasm with bronchial thickening noted centrally on both sides and increased. Upper Abdomen: There is body wall anasarca and mesenteric edema noted diffusely, moderate  free ascites. There is wall thickening versus nondistention the visualized portions of the colon. Appendix steatosis. Musculoskeletal: There is extensive bridging enthesopathy the anterior thoracic spine and multilevel bridging syndesmophytes, osteopenia. Review of the MIP images confirms the above findings. IMPRESSION: 1. Slightly prominent pulmonary trunk. No embolus is seen through the segmental divisions. The subsegmental arterial bed is either obscured by breathing motion or large effusions. 2. Within normal limits cardiac size with 2-vessel calcific CAD. Aortic atherosclerosis. 3. There is body wall anasarca, moderate mesenteric edema and upper abdominal ascites, large left and moderate to large right pleural effusions. Findings could be congestive or from fluid overload. 4. The left lower lobe is collapsed by the pleural effusion with atelectasis in the posterior aspects of the left upper lobe and abutting right lung. Underlying pneumonia would be difficult to exclude. Changes of chronic small airways disease in the lateral right middle lobe. 5. Small caliber central airways with bronchial thickening suggesting bronchitis. The central airways could be small in caliber due to respiratory phase or bronchospasm. 6. Moderate-to-large hiatal hernia. 7. Thickening of the colonic wall versus nondistention. Electronically Signed   By: Telford Nab M.D.   On: 05/22/2021 23:31   CT CHEST ABDOMEN  PELVIS W CONTRAST  Result Date: 06/04/2021 CLINICAL DATA:  Sepsis.  Abdominal distension. EXAM: CT CHEST, ABDOMEN, AND PELVIS WITH CONTRAST TECHNIQUE: Multidetector CT imaging of the chest, abdomen and pelvis was performed following the standard protocol during bolus administration of intravenous contrast. CONTRAST:  42mL OMNIPAQUE IOHEXOL 350 MG/ML SOLN IV for abdominal exam. Due to technical error, the patient returned for chest CT after abdominal exam is performed, an additional 60 cc Omnipaque 350 IV was administered.  COMPARISON:  Recent chest radiographs, chest CT 04/22/2021, abdominal CT 02/20/2021 FINDINGS: CT CHEST FINDINGS Cardiovascular: Normal heart size with coronary artery calcifications. Tortuous aorta without acute findings. Prominent main pulmonary artery at 3 cm, unchanged. No pericardial effusion. Mediastinum/Nodes: Scattered small mediastinal lymph nodes are not enlarged by size criteria. Moderate-sized hiatal hernia. Wall thickening of the distal esophagus and the intrathoracic stomach. Lungs/Pleura: Moderate right and large left pleural effusion with associated compressive atelectasis. No significant change from prior exam. Breathing motion artifact limits assessment mild perifissural atelectasis. No convincing pulmonary edema. Musculoskeletal: Exaggerated thoracic kyphosis. Multilevel spondylosis. There are no acute or suspicious osseous abnormalities. Minor chest wall edema. CT ABDOMEN PELVIS FINDINGS Hepatobiliary: Motion artifact through the liver. 11 mm cyst in the hepatic dome. The gallbladder is distended. Layering stones or sludge in the gallbladder. Expansile filling defect within the main portal vein, new from prior abdominal CT. Multiple collaterals at the porta hepatis and in the upper abdomen. Common bile duct is difficult to define. No obvious common bile duct dilatation. Central periportal edema. Pancreas: Motion artifact through the pancreas significantly limits assessment. Parenchymal atrophy. No obvious peripancreatic inflammation. Spleen: Normal in size without focal abnormality. The portal vein is not well-defined and may be occluded, although there is motion artifact through this region limiting assessment. Adrenals/Urinary Tract: No adrenal nodule. No hydronephrosis. No renal stone or focal renal abnormality. Symmetric renal excretion on delayed phase imaging. Foley catheter decompresses the urinary bladder. There is diffuse bladder wall thickening, greater than typically seen with bladder  nondistention. Stomach/Bowel: Moderate to large hiatal hernia. There are perigastric varices. No small bowel dilatation. No obvious small bowel inflammation. Motion artifact and lack of enteric contrast limits bowel assessment. There perirectal varices. Colonic tortuosity with moderate volume of stool. Mild cecal distention. The appendix is not visualized. Vascular/Lymphatic: Expansile thrombus in the main portal vein and superior mesenteric vein. Suspect thrombus in the splenic vein, though not well-defined. There are multiple portosystemic collaterals, perirectal and perigastric varices. Normal caliber abdominal aorta. No bulky abdominopelvic adenopathy, although assessment for enlarged lymph nodes is limited due to motion. Reproductive: Prominent prostate gland. Other: Small to moderate volume abdominopelvic ascites. Generalized mesenteric edema. No free air. Mild generalized body wall edema. Musculoskeletal: Sacral decubitus ulcer. No obvious sacral destruction or periosteal reaction. Slight chronic deformity of the hips. No acute osseous abnormalities are seen. IMPRESSION: 1. 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 Suspect thrombus in the splenic vein, though not well-defined. Multiple portosystemic collaterals, perirectal and perigastric varices. 2. Moderate right and large left pleural effusion with associated compressive atelectasis. 3. Small to moderate volume abdominopelvic ascites. Generalized mesenteric edema. 4. Sacral decubitus ulcer without obvious sacral destruction or periosteal reaction. 5. Distended gallbladder with layering stones or sludge in the gallbladder. 6. Diffuse bladder wall thickening, greater than typically seen with bladder nondistention. Recommend correlation with urinalysis to exclude urinary tract infection. 7. Moderate to large hiatal hernia. Wall thickening of the distal esophagus and intrathoracic stomach, can be seen with  reflux or  esophagitis. Aortic Atherosclerosis (ICD10-I70.0). These results will be called to the ordering clinician or representative by the Radiologist Assistant, and communication documented in the PACS or [employer]. Electronically Signed   By: Keith Rake M.D.   On: 06/04/2021 01:09   DG CHEST PORT 1 VIEW  Result Date: 05/30/2021 CLINICAL DATA:  Fever, cerebral palsy EXAM: PORTABLE CHEST 1 VIEW COMPARISON:  05/28/2021 FINDINGS: 2 frontal views of the chest are obtained. Evaluation is extremely limited due to marked patient rotation to the right. Bibasilar veiling opacities are again noted, without significant change since prior study allowing for limitations on this portable exam. No pneumothorax. No acute bony abnormalities. IMPRESSION: 1. Extremely limited study due to patient positioning. 2. Stable bibasilar opacities consistent with consolidation and/or effusion. Electronically Signed   By: Randa Ngo M.D.   On: 05/30/2021 15:49   DG CHEST PORT 1 VIEW  Result Date: 05/28/2021 CLINICAL DATA:  Hypoxia. EXAM: PORTABLE CHEST 1 VIEW COMPARISON:  May 25, 2021. FINDINGS: Stable cardiomediastinal silhouette. Increased bilateral lung opacities are noted concerning for worsening pneumonia or edema. Small bilateral pleural effusions may be present. Bony thorax is unremarkable. IMPRESSION: Increased bilateral lung opacities as described above. Electronically Signed   By: Marijo Conception M.D.   On: 05/28/2021 08:30   DG Chest Port 1 View  Result Date: 05/25/2021 CLINICAL DATA:  Pleural effusion status post thoracentesis EXAM: PORTABLE CHEST 1 VIEW COMPARISON:  Chest radiograph 1 day prior FINDINGS: The cardiomediastinal silhouette is grossly stable, allowing for significant rightward patient rotation. The left pleural effusion has markedly reduced in size following thoracentesis with significantly improved aeration of the left lung. Opacity overlying the right hemithorax is favored to be external to  the patient. There is no significant right effusion. There is no pneumothorax. IMPRESSION: Markedly reduced left pleural effusion with improved aeration of the left lung following thoracentesis. No pneumothorax. Electronically Signed   By: Valetta Mole M.D.   On: 05/25/2021 12:02   DG CHEST PORT 1 VIEW  Result Date: 05/24/2021 CLINICAL DATA:  Status post right thoracentesis. EXAM: PORTABLE CHEST 1 VIEW COMPARISON:  Radiograph dated December 27th 2022 FINDINGS: Patient kyphosis somewhat limits evaluation. Cardiac and mediastinal contours are not well visualized. Large left pleural effusion. Bibasilar atelectasis. No evidence of pneumothorax. IMPRESSION: No evidence of pneumothorax. Electronically Signed   By: Yetta Glassman M.D.   On: 05/24/2021 12:13   DG Chest Port 1 View  Result Date: 05/22/2021 CLINICAL DATA:  Cough, shortness of breath EXAM: PORTABLE CHEST 1 VIEW COMPARISON:  02/20/2021 FINDINGS: Transverse diameter of heart is increased. There is interval appearance of diffuse haziness in both lungs, more so on the left side. Both diaphragms are obscured. Costophrenic angles are indistinct. There is no pneumothorax. IMPRESSION: Cardiomegaly. Central pulmonary vessels are prominent suggesting CHF. Diffuse haziness is seen in the both lungs, more so on the left side suggesting interval appearance of moderate to large bilateral pleural effusions, more so on the left side. Possibility of underlying pneumonia could not be evaluated due to pleural effusions. Electronically Signed   By: Elmer Picker M.D.   On: 05/22/2021 13:52   DG Abd Portable 1V  Result Date: 05/31/2021 CLINICAL DATA:  Abdominal distension. EXAM: PORTABLE ABDOMEN - 1 VIEW COMPARISON:  CT 02/20/2021 FINDINGS: Rotated exam, best positioning due to patient medical condition per technologist. Moderate stool in the right and transverse colon, also seen on prior CT. No evidence of small bowel dilatation or obstruction. Surgical clips  in the region of the gastroesophageal junction. Gallstones on CT are not seen by radiograph. IMPRESSION: Moderate stool in the right and transverse colon. No evidence of bowel obstruction. Electronically Signed   By: Keith Rake M.D.   On: 05/31/2021 21:14   [place] ASCITES (ABDOMEN LIMITED)  Result Date: 06/01/2021 CLINICAL DATA:  61 year old male referred for possible paracentesis EXAM: LIMITED ABDOMEN ULTRASOUND FOR ASCITES TECHNIQUE: Limited ultrasound survey for ascites was performed in all four abdominal quadrants. COMPARISON:  None. FINDINGS: Limited ultrasound performed in 4 quadrants of the abdomen demonstrating scant ascites. Paracentesis not performed at this time IMPRESSION: Limited ultrasound demonstrates scant ascites Electronically Signed   By: Corrie Mckusick D.O.   On: 06/01/2021 17:52   US THORACENTESIS ASP PLEURAL SPACE W/IMG GUIDE  Result Date: 06/05/2021 INDICATION: Quadriplegia, congestive heart failure, and bilateral pleural effusions. Request for therapeutic thoracentesis. EXAM: ULTRASOUND GUIDED LEFT THORACENTESIS MEDICATIONS: 1% lidocaine 10 mL COMPLICATIONS: None immediate. PROCEDURE: An ultrasound guided thoracentesis was thoroughly discussed with the patient and questions answered. The benefits, risks, alternatives and complications were also discussed. The patient understands and wishes to proceed with the procedure. Written consent was obtained. Ultrasound was performed to localize and mark an adequate pocket of fluid in the left chest. The area was then prepped and draped in the normal sterile fashion. 1% Lidocaine was used for local anesthesia. Under ultrasound guidance a 6 Fr Safe-T-Centesis catheter was introduced. Thoracentesis was performed. The catheter was removed and a dressing applied. FINDINGS: A total of approximately 1 L of clear yellow fluid was removed. IMPRESSION: Successful ultrasound guided left thoracentesis yielding 1 L of pleural fluid. No pneumothorax on  post-procedure chest x-ray. Read by: Gareth Eagle, PA-C Electronically Signed   By: Albin Felling M.D.   On: 06/05/2021 09:39   US THORACENTESIS ASP PLEURAL SPACE W/IMG GUIDE  Result Date: 05/25/2021 INDICATION: Shortness of breath, large left pleural effusion seen on previous chest x-ray. Request for therapeutic and diagnostic thoracentesis. EXAM: ULTRASOUND GUIDED LEFT THORACENTESIS MEDICATIONS: 10 mL 1% lidocaine COMPLICATIONS: None immediate. PROCEDURE: An ultrasound guided thoracentesis was thoroughly discussed with the patient and questions answered. The benefits, risks, alternatives and complications were also discussed. The patient understands and wishes to proceed with the procedure. Written consent was obtained. Ultrasound was performed to localize and mark an adequate pocket of fluid in the left chest. The area was then prepped and draped in the normal sterile fashion. 1% Lidocaine was used for local anesthesia. Under ultrasound guidance a 6 Fr Safe-T-Centesis catheter was introduced. Thoracentesis was performed. The catheter was removed and a dressing applied. FINDINGS: A total of approximately 1.3 L of clear yellow fluid was removed. Samples were sent to the laboratory as requested by the clinical team. Post procedure chest X-ray reviewed, negative for pneumothorax. IMPRESSION: Successful ultrasound guided left thoracentesis yielding 1.3 L of pleural fluid. Read by: Durenda Guthrie, PA-C Electronically Signed   By: Lucrezia Europe M.D.   On: 05/25/2021 14:49   US THORACENTESIS ASP PLEURAL SPACE W/IMG GUIDE  Result Date: 05/24/2021 INDICATION: Cough, pleural effusion, shortness of breath EXAM: ULTRASOUND GUIDED right THORACENTESIS MEDICATIONS: None. COMPLICATIONS: None immediate. PROCEDURE: An ultrasound guided thoracentesis was thoroughly discussed with the patient and questions answered. The benefits, risks, alternatives and complications were also discussed. The patient understands and wishes to  proceed with the procedure. Written consent was obtained. Ultrasound was performed to localize and mark an adequate pocket of fluid in the right chest. The area was then prepped and draped in  the normal sterile fashion. 1% Lidocaine was used for local anesthesia. Under ultrasound guidance a 6 Fr Safe-T-Centesis catheter was introduced. Thoracentesis was performed. The catheter was removed and a dressing applied. FINDINGS: A total of approximately 420 of yellow fluid was removed. Samples were sent to the laboratory as requested by the clinical team. IMPRESSION: Successful ultrasound guided right thoracentesis yielding yellow of pleural fluid. Performed and dictated by Pasty Spillers, PA-C Electronically Signed   By: Ruthann Cancer M.D.   On: 05/24/2021 14:24    Subjective: Without complaints  Discharge Exam: Vitals:   06/20/21 1259 06/20/21 2106  BP: (!) 157/108 106/77  Pulse: (!) 108 (!) 101  Resp: (!) 22 17  Temp: 97.7 F (36.5 C) 98.7 F (37.1 C)  SpO2: 91% 94%   Vitals:   06/19/21 0920 06/19/21 2311 06/20/21 1259 06/20/21 2106  BP: 135/77 109/79 (!) 157/108 106/77  Pulse: 75 62 (!) 108 (!) 101  Resp:  16 (!) 22 17  Temp: 98.7 F (37.1 C) 97.8 F (36.6 C) 97.7 F (36.5 C) 98.7 F (37.1 C)  TempSrc: Oral Oral Oral Oral  SpO2: 94% 91% 91% 94%  Weight:      Height:        General: Pt is alert, awake, not in acute distress Cardiovascular: RRR, S1/S2  Respiratory: Normal resp effort, no auditory wheezing, active coughing Abdominal: Soft, NT, ND, bowel sounds + Extremities: no edema, no cyanosis   The results of significant diagnostics from this hospitalization (including imaging, microbiology, ancillary and laboratory) are listed below for reference.     Microbiology: No results found for this or any previous visit (from the past 240 hour(s)).   Labs: BNP (last 3 results) Recent Labs    02/20/21 1215 05/22/21 1348  BNP 41.5 [id number]   Basic Metabolic Panel: No results  for input(s): NA, K, CL, CO2, GLUCOSE, BUN, CREATININE, CALCIUM, MG, PHOS in the last 168 hours. Liver Function Tests: No results for input(s): AST, ALT, ALKPHOS, BILITOT, PROT, ALBUMIN in the last 168 hours. No results for input(s): LIPASE, AMYLASE in the last 168 hours. No results for input(s): AMMONIA in the last 168 hours. CBC: No results for input(s): WBC, NEUTROABS, HGB, HCT, MCV, PLT in the last 168 hours. Cardiac Enzymes: No results for input(s): CKTOTAL, CKMB, CKMBINDEX, TROPONINI in the last 168 hours. BNP: Invalid input(s): POCBNP CBG: No results for input(s): GLUCAP in the last 168 hours. D-Dimer No results for input(s): DDIMER in the last 72 hours. Hgb A1c No results for input(s): HGBA1C in the last 72 hours. Lipid Profile No results for input(s): CHOL, HDL, LDLCALC, TRIG, CHOLHDL, LDLDIRECT in the last 72 hours. Thyroid function studies No results for input(s): TSH, T4TOTAL, T3FREE, THYROIDAB in the last 72 hours.  Invalid input(s): FREET3 Anemia work up No results for input(s): VITAMINB12, FOLATE, FERRITIN, TIBC, IRON, RETICCTPCT in the last 72 hours. Urinalysis    Component Value Date/Time   COLORURINE YELLOW (A) 02/20/2021 1432   APPEARANCEUR CLEAR (A) 02/20/2021 1432   LABSPEC >1.046 (H) 02/20/2021 1432   PHURINE 6.0 02/20/2021 1432   GLUCOSEU NEGATIVE 02/20/2021 1432   HGBUR NEGATIVE 02/20/2021 1432   HGBUR negative 06/14/2010 1119   BILIRUBINUR NEGATIVE 02/20/2021 1432   BILIRUBINUR SMALL 01/04/2016 1634   KETONESUR NEGATIVE 02/20/2021 1432   PROTEINUR 100 (A) 02/20/2021 1432   UROBILINOGEN 2.0 (H) 02/05/2018 1825   NITRITE NEGATIVE 02/20/2021 1432   LEUKOCYTESUR NEGATIVE 02/20/2021 1432   Sepsis Labs Invalid input(s): PROCALCITONIN,  WBC,  [place] Microbiology No results found for this or any previous visit (from the past 240 hour(s)).  Time spent: 30 min SIGNED:   Marylu Lund, MD  [facility] 06/21/2021, 12:35 PM  If 7PM-7AM,  please contact night-coverage

## 2021-06-21 NOTE — TOC Progression Note (Signed)
Transition of Care [facility]) - Progression Note    Patient Details  Name: [name] MRN: [id number] Date of Birth: 29-Feb-1961  [facility]) CM/SW Contact  Geni Bers, RN Phone Number: 06/21/2021, 1:12 PM  Clinical Narrative:    [facility] offered a bed to pt. Pt's sister [name] was called to inform her. Waiting for [facility] to return call.    Expected Discharge Plan: Hospice Medical Facility Barriers to Discharge: No Barriers Identified  Expected Discharge Plan and Services Expected Discharge Plan: Hospice Medical Facility       Living arrangements for the past 2 months:  (Group home) Expected Discharge Date: 06/21/21                                     Social Determinants of Health (SDOH) Interventions    Readmission Risk Interventions Readmission Risk Prevention Plan 08/03/2020  Post Dischage Appt Complete  Medication Screening Complete  Some recent data might be hidden

## 2021-06-21 NOTE — TOC Progression Note (Signed)
Transition of Care [facility]) - Progression Note    Patient Details  Name: [name] MRN: [id number] Date of Birth: 1960-12-11  [facility]) CM/SW Contact  Geni Bers, RN Phone Number: 06/21/2021, 3:35 PM  Clinical Narrative:     [name] was called. Pt's sister [name] was called to inform her that pt would transfer to [facility].   Expected Discharge Plan: Hospice Medical Facility Barriers to Discharge: No Barriers Identified  Expected Discharge Plan and Services Expected Discharge Plan: Hospice Medical Facility       Living arrangements for the past 2 months:  (Group home) Expected Discharge Date: 06/21/21                                     Social Determinants of Health (SDOH) Interventions    Readmission Risk Interventions Readmission Risk Prevention Plan 08/03/2020  Post Dischage Appt Complete  Medication Screening Complete  Some recent data might be hidden

## 2021-06-21 NOTE — TOC Progression Note (Signed)
Transition of Care [facility]) - Progression Note    Patient Details  Name: [name] MRN: [id number] Date of Birth: [birth year]  Transition of Care [facility]) CM/SW Contact  Geni Bers, RN Phone Number: 06/21/2021, 1:30 PM  Clinical Narrative:     Hospice called and asked for a Rapid COVID test.   Expected Discharge Plan: Hospice Medical Facility Barriers to Discharge: No Barriers Identified  Expected Discharge Plan and Services Expected Discharge Plan: Hospice Medical Facility       Living arrangements for the past 2 months:  (Group home) Expected Discharge Date: 06/21/21                                     Social Determinants of Health (SDOH) Interventions    Readmission Risk Interventions Readmission Risk Prevention Plan 08/03/2020  Post Dischage Appt Complete  Medication Screening Complete  Some recent data might be hidden

## 2021-07-25 DEATH — deceased

## 2023-05-02 IMAGING — CR DG CHEST 2V
3 series · 3 of 3 positions shown · non-contrast
Comparison: 10/02/2020

CLINICAL DATA: Pneumonitis due to aspiration

EXAM:
CHEST - 2 VIEW

[w chest lat]
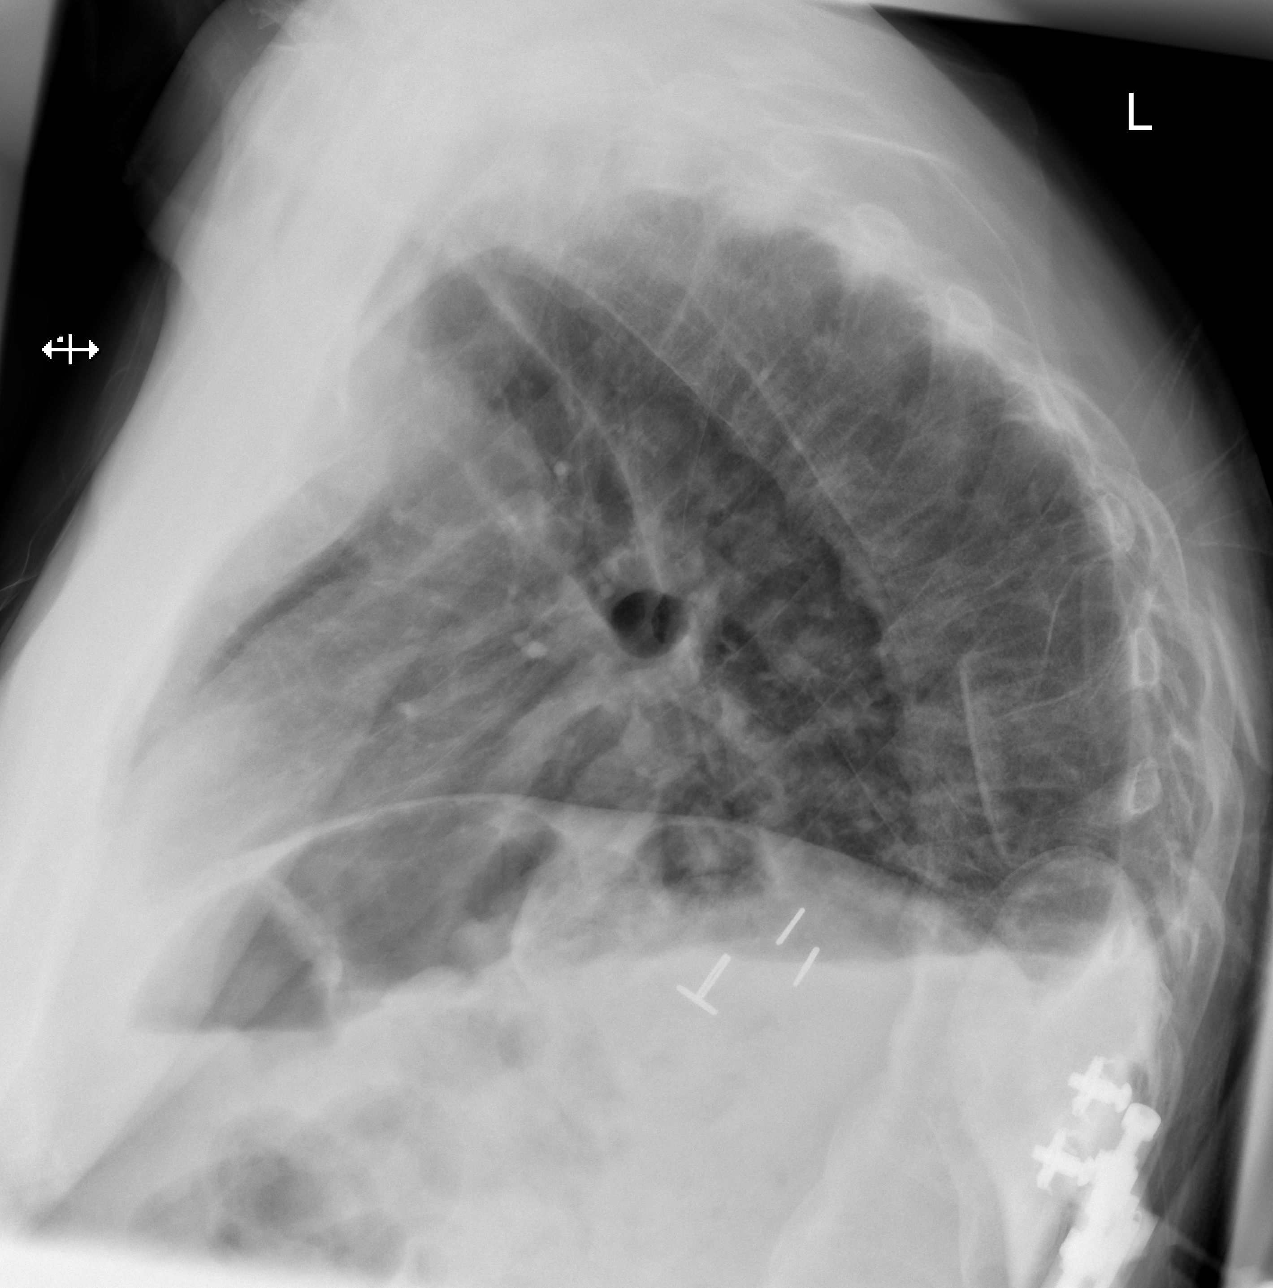

[x chest ap (1 of 2)]
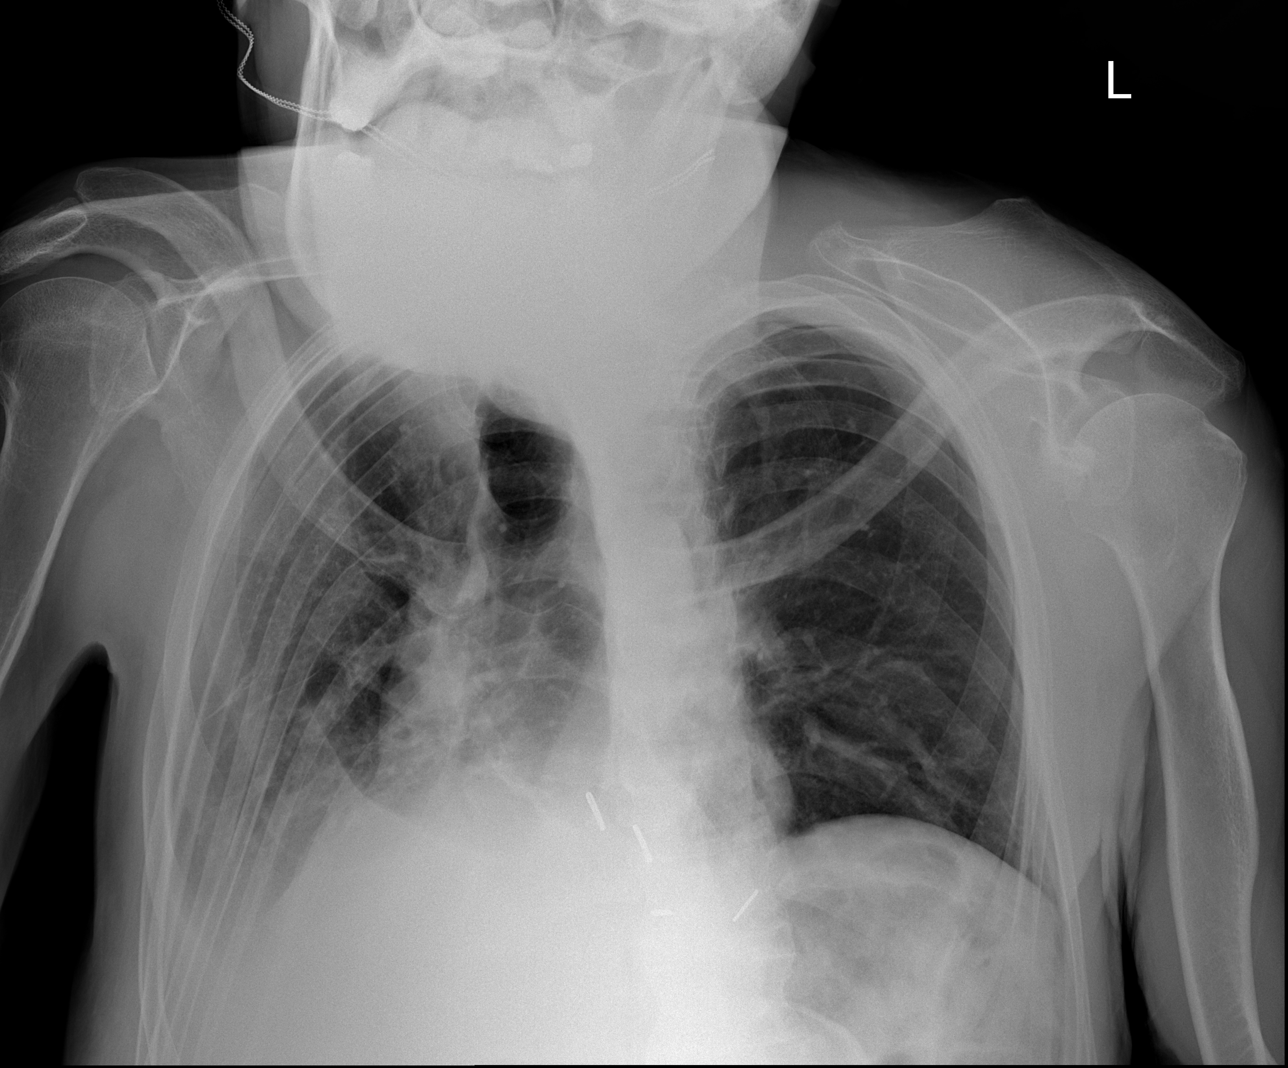

[x chest ap (2 of 2)]
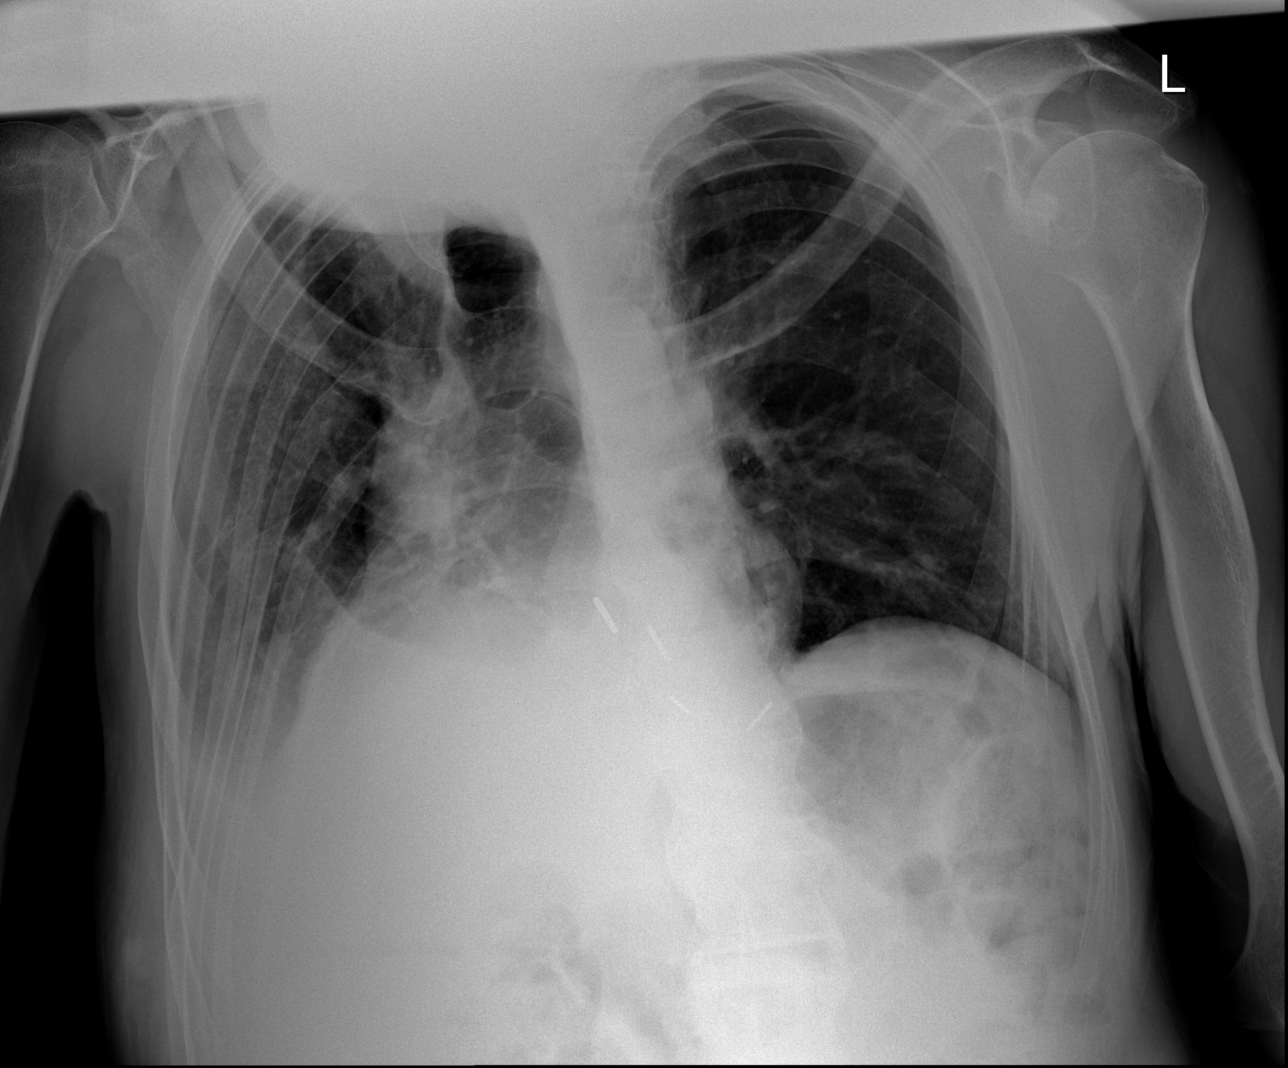

[3 of 3 positions shown; findings below may reference images not displayed]

FINDINGS: Airspace disease in the right lower lung is unchanged. Small right
pleural effusion. Left lung clear. Heart mildly enlarged.
IMPRESSION: Small right pleural effusion.

Right lower lobe atelectasis or infiltrate, unchanged.
